# Patient Record
Sex: Female | Born: 1935
Health system: Southern US, Community
[De-identification: ages and names within clinical notes are randomized; demographics above are authoritative.]

## PROBLEM LIST (undated history)

## (undated) DIAGNOSIS — M549 Dorsalgia, unspecified: Secondary | ICD-10-CM

## (undated) DIAGNOSIS — R350 Frequency of micturition: Secondary | ICD-10-CM

## (undated) DIAGNOSIS — I2510 Atherosclerotic heart disease of native coronary artery without angina pectoris: Secondary | ICD-10-CM

## (undated) DIAGNOSIS — G8929 Other chronic pain: Secondary | ICD-10-CM

## (undated) DIAGNOSIS — N289 Disorder of kidney and ureter, unspecified: Secondary | ICD-10-CM

## (undated) DIAGNOSIS — R569 Unspecified convulsions: Secondary | ICD-10-CM

## (undated) DIAGNOSIS — I712 Thoracic aortic aneurysm, without rupture, unspecified: Secondary | ICD-10-CM

## (undated) DIAGNOSIS — E785 Hyperlipidemia, unspecified: Secondary | ICD-10-CM

## (undated) DIAGNOSIS — I82409 Acute embolism and thrombosis of unspecified deep veins of unspecified lower extremity: Secondary | ICD-10-CM

## (undated) DIAGNOSIS — K219 Gastro-esophageal reflux disease without esophagitis: Secondary | ICD-10-CM

## (undated) DIAGNOSIS — K859 Acute pancreatitis without necrosis or infection, unspecified: Secondary | ICD-10-CM

## (undated) DIAGNOSIS — F419 Anxiety disorder, unspecified: Secondary | ICD-10-CM

## (undated) DIAGNOSIS — F32A Depression, unspecified: Secondary | ICD-10-CM

## (undated) DIAGNOSIS — E669 Obesity, unspecified: Secondary | ICD-10-CM

## (undated) DIAGNOSIS — I10 Essential (primary) hypertension: Secondary | ICD-10-CM

## (undated) DIAGNOSIS — D649 Anemia, unspecified: Secondary | ICD-10-CM

## (undated) DIAGNOSIS — M130 Polyarthritis, unspecified: Secondary | ICD-10-CM

## (undated) DIAGNOSIS — R42 Dizziness and giddiness: Secondary | ICD-10-CM

## (undated) DIAGNOSIS — E538 Deficiency of other specified B group vitamins: Secondary | ICD-10-CM

## (undated) DIAGNOSIS — F329 Major depressive disorder, single episode, unspecified: Secondary | ICD-10-CM

## (undated) HISTORY — DX: Obesity, unspecified: E66.9

## (undated) HISTORY — PX: CHOLECYSTECTOMY: SHX55

## (undated) HISTORY — DX: Thoracic aortic aneurysm, without rupture, unspecified: I71.20

## (undated) HISTORY — DX: Polyarthritis, unspecified: M13.0

## (undated) HISTORY — PX: HEMORRHOID SURGERY: SHX153

## (undated) HISTORY — DX: Depression, unspecified: F32.A

## (undated) HISTORY — DX: Gastro-esophageal reflux disease without esophagitis: K21.9

## (undated) HISTORY — DX: Frequency of micturition: R35.0

## (undated) HISTORY — PX: ABDOMINAL HYSTERECTOMY: SHX81

## (undated) HISTORY — DX: Acute embolism and thrombosis of unspecified deep veins of unspecified lower extremity: I82.409

## (undated) HISTORY — DX: Acute pancreatitis without necrosis or infection, unspecified: K85.90

## (undated) HISTORY — DX: Deficiency of other specified B group vitamins: E53.8

## (undated) HISTORY — PX: TUBAL LIGATION: SHX77

## (undated) HISTORY — DX: Anxiety disorder, unspecified: F41.9

## (undated) HISTORY — DX: Anemia, unspecified: D64.9

## (undated) HISTORY — DX: Disorder of kidney and ureter, unspecified: N28.9

## (undated) HISTORY — DX: Essential (primary) hypertension: I10

## (undated) HISTORY — DX: Hyperlipidemia, unspecified: E78.5

## (undated) HISTORY — DX: Thoracic aortic aneurysm, without rupture: I71.2

## (undated) HISTORY — DX: Atherosclerotic heart disease of native coronary artery without angina pectoris: I25.10

## (undated) HISTORY — DX: Major depressive disorder, single episode, unspecified: F32.9

---

## 1997-11-12 HISTORY — PX: CORONARY ANGIOPLASTY WITH STENT PLACEMENT: SHX49

## 1998-07-07 ENCOUNTER — Ambulatory Visit: Admission: RE | Admit: 1998-07-07 | Discharge: 1998-07-07 | Payer: Self-pay | Admitting: Internal Medicine

## 1998-07-09 ENCOUNTER — Inpatient Hospital Stay (HOSPITAL_COMMUNITY): Admission: EM | Admit: 1998-07-09 | Discharge: 1998-07-12 | Payer: Self-pay | Admitting: Emergency Medicine

## 1998-07-09 ENCOUNTER — Encounter: Payer: Self-pay | Admitting: Emergency Medicine

## 1998-08-02 ENCOUNTER — Encounter: Payer: Self-pay | Admitting: Emergency Medicine

## 1998-08-02 ENCOUNTER — Emergency Department (HOSPITAL_COMMUNITY): Admission: EM | Admit: 1998-08-02 | Discharge: 1998-08-02 | Payer: Self-pay | Admitting: Emergency Medicine

## 1998-08-04 ENCOUNTER — Inpatient Hospital Stay (HOSPITAL_COMMUNITY): Admission: RE | Admit: 1998-08-04 | Discharge: 1998-08-05 | Payer: Self-pay | Admitting: Cardiology

## 1998-08-04 ENCOUNTER — Encounter: Payer: Self-pay | Admitting: Cardiology

## 2000-09-05 ENCOUNTER — Emergency Department (HOSPITAL_COMMUNITY): Admission: EM | Admit: 2000-09-05 | Discharge: 2000-09-05 | Payer: Self-pay | Admitting: Emergency Medicine

## 2001-02-12 ENCOUNTER — Encounter: Admission: RE | Admit: 2001-02-12 | Discharge: 2001-05-13 | Payer: Self-pay | Admitting: Psychiatry

## 2001-04-04 ENCOUNTER — Encounter: Payer: Self-pay | Admitting: Psychiatry

## 2001-04-04 ENCOUNTER — Encounter: Admission: RE | Admit: 2001-04-04 | Discharge: 2001-04-04 | Payer: Self-pay | Admitting: Psychiatry

## 2001-06-17 ENCOUNTER — Other Ambulatory Visit: Admission: RE | Admit: 2001-06-17 | Discharge: 2001-06-17 | Payer: Self-pay | Admitting: *Deleted

## 2001-10-27 ENCOUNTER — Encounter: Payer: Self-pay | Admitting: Emergency Medicine

## 2001-10-27 ENCOUNTER — Emergency Department (HOSPITAL_COMMUNITY): Admission: EM | Admit: 2001-10-27 | Discharge: 2001-10-27 | Payer: Self-pay | Admitting: Emergency Medicine

## 2002-02-01 ENCOUNTER — Emergency Department (HOSPITAL_COMMUNITY): Admission: EM | Admit: 2002-02-01 | Discharge: 2002-02-01 | Payer: Self-pay

## 2002-02-01 ENCOUNTER — Encounter: Payer: Self-pay | Admitting: Emergency Medicine

## 2002-08-19 ENCOUNTER — Encounter: Admission: RE | Admit: 2002-08-19 | Discharge: 2002-08-19 | Payer: Self-pay | Admitting: Internal Medicine

## 2002-08-19 ENCOUNTER — Encounter: Payer: Self-pay | Admitting: Internal Medicine

## 2002-09-03 ENCOUNTER — Encounter: Admission: RE | Admit: 2002-09-03 | Discharge: 2002-09-16 | Payer: Self-pay | Admitting: Internal Medicine

## 2002-09-10 ENCOUNTER — Encounter: Admission: RE | Admit: 2002-09-10 | Discharge: 2002-09-10 | Payer: Self-pay | Admitting: Internal Medicine

## 2002-09-10 ENCOUNTER — Encounter: Payer: Self-pay | Admitting: Internal Medicine

## 2002-12-02 ENCOUNTER — Inpatient Hospital Stay (HOSPITAL_COMMUNITY): Admission: EM | Admit: 2002-12-02 | Discharge: 2002-12-06 | Payer: Self-pay | Admitting: Emergency Medicine

## 2002-12-02 ENCOUNTER — Encounter: Payer: Self-pay | Admitting: Internal Medicine

## 2002-12-02 ENCOUNTER — Encounter: Payer: Self-pay | Admitting: Emergency Medicine

## 2002-12-04 ENCOUNTER — Encounter: Payer: Self-pay | Admitting: Internal Medicine

## 2002-12-05 ENCOUNTER — Encounter: Payer: Self-pay | Admitting: Internal Medicine

## 2003-05-03 ENCOUNTER — Encounter: Admission: RE | Admit: 2003-05-03 | Discharge: 2003-05-03 | Payer: Self-pay | Admitting: Rheumatology

## 2003-05-03 ENCOUNTER — Encounter: Payer: Self-pay | Admitting: Rheumatology

## 2003-05-07 ENCOUNTER — Encounter: Payer: Self-pay | Admitting: Emergency Medicine

## 2003-05-07 ENCOUNTER — Encounter: Admission: RE | Admit: 2003-05-07 | Discharge: 2003-05-07 | Payer: Self-pay | Admitting: Rheumatology

## 2003-05-07 ENCOUNTER — Encounter: Payer: Self-pay | Admitting: Rheumatology

## 2003-05-07 ENCOUNTER — Emergency Department (HOSPITAL_COMMUNITY): Admission: EM | Admit: 2003-05-07 | Discharge: 2003-05-07 | Payer: Self-pay | Admitting: Emergency Medicine

## 2003-11-25 ENCOUNTER — Emergency Department (HOSPITAL_COMMUNITY): Admission: EM | Admit: 2003-11-25 | Discharge: 2003-11-25 | Payer: Self-pay | Admitting: Emergency Medicine

## 2004-02-28 ENCOUNTER — Emergency Department (HOSPITAL_COMMUNITY): Admission: EM | Admit: 2004-02-28 | Discharge: 2004-02-29 | Payer: Self-pay | Admitting: Emergency Medicine

## 2004-08-10 ENCOUNTER — Encounter: Admission: RE | Admit: 2004-08-10 | Discharge: 2004-08-10 | Payer: Self-pay | Admitting: Endocrinology

## 2004-09-15 ENCOUNTER — Emergency Department (HOSPITAL_COMMUNITY): Admission: EM | Admit: 2004-09-15 | Discharge: 2004-09-15 | Payer: Self-pay | Admitting: *Deleted

## 2004-09-19 ENCOUNTER — Ambulatory Visit (HOSPITAL_COMMUNITY): Admission: RE | Admit: 2004-09-19 | Discharge: 2004-09-19 | Payer: Self-pay | Admitting: Endocrinology

## 2004-09-19 ENCOUNTER — Ambulatory Visit: Payer: Self-pay | Admitting: Endocrinology

## 2004-09-28 ENCOUNTER — Ambulatory Visit: Payer: Self-pay | Admitting: Endocrinology

## 2004-10-11 ENCOUNTER — Ambulatory Visit: Payer: Self-pay | Admitting: Internal Medicine

## 2004-11-21 ENCOUNTER — Ambulatory Visit: Payer: Self-pay | Admitting: Endocrinology

## 2004-11-22 ENCOUNTER — Encounter: Payer: Self-pay | Admitting: Internal Medicine

## 2004-11-22 ENCOUNTER — Ambulatory Visit: Payer: Self-pay

## 2004-11-22 ENCOUNTER — Encounter: Payer: Self-pay | Admitting: Cardiology

## 2005-01-10 ENCOUNTER — Ambulatory Visit: Payer: Self-pay | Admitting: Endocrinology

## 2005-01-24 ENCOUNTER — Ambulatory Visit: Payer: Self-pay | Admitting: Endocrinology

## 2005-04-15 ENCOUNTER — Emergency Department (HOSPITAL_COMMUNITY): Admission: EM | Admit: 2005-04-15 | Discharge: 2005-04-15 | Payer: Self-pay | Admitting: Emergency Medicine

## 2005-07-24 ENCOUNTER — Ambulatory Visit: Payer: Self-pay | Admitting: Endocrinology

## 2005-08-22 ENCOUNTER — Ambulatory Visit: Payer: Self-pay | Admitting: Endocrinology

## 2005-08-27 ENCOUNTER — Ambulatory Visit: Payer: Self-pay | Admitting: Endocrinology

## 2005-09-03 ENCOUNTER — Encounter: Admission: RE | Admit: 2005-09-03 | Discharge: 2005-09-03 | Payer: Self-pay | Admitting: Endocrinology

## 2005-09-18 ENCOUNTER — Ambulatory Visit: Payer: Self-pay | Admitting: Endocrinology

## 2005-09-24 ENCOUNTER — Ambulatory Visit: Payer: Self-pay | Admitting: Gastroenterology

## 2005-10-09 ENCOUNTER — Ambulatory Visit: Payer: Self-pay | Admitting: Endocrinology

## 2005-10-11 ENCOUNTER — Ambulatory Visit: Payer: Self-pay | Admitting: Gastroenterology

## 2005-10-24 ENCOUNTER — Ambulatory Visit: Payer: Self-pay | Admitting: Gastroenterology

## 2006-02-13 ENCOUNTER — Ambulatory Visit: Payer: Self-pay | Admitting: Endocrinology

## 2006-02-14 ENCOUNTER — Ambulatory Visit: Admission: RE | Admit: 2006-02-14 | Discharge: 2006-02-14 | Payer: Self-pay | Admitting: Endocrinology

## 2006-02-21 ENCOUNTER — Ambulatory Visit: Payer: Self-pay | Admitting: Endocrinology

## 2006-04-09 ENCOUNTER — Ambulatory Visit: Payer: Self-pay | Admitting: Internal Medicine

## 2006-04-15 ENCOUNTER — Ambulatory Visit: Payer: Self-pay | Admitting: Hematology and Oncology

## 2006-04-23 ENCOUNTER — Ambulatory Visit (HOSPITAL_COMMUNITY): Admission: RE | Admit: 2006-04-23 | Discharge: 2006-04-23 | Payer: Self-pay | Admitting: Internal Medicine

## 2006-04-23 LAB — URINALYSIS, MICROSCOPIC - CHCC
Blood: NEGATIVE
Glucose: NEGATIVE g/dL
Nitrite: NEGATIVE

## 2006-04-23 LAB — CBC & DIFF AND RETIC
BASO%: 0.2 % (ref 0.0–2.0)
Basophils Absolute: 0 10*3/uL (ref 0.0–0.1)
EOS%: 2.2 % (ref 0.0–7.0)
Eosinophils Absolute: 0.1 10*3/uL (ref 0.0–0.5)
HCT: 27.9 % — ABNORMAL LOW (ref 34.8–46.6)
HGB: 9.3 g/dL — ABNORMAL LOW (ref 11.6–15.9)
IRF: 0.21 (ref 0.130–0.330)
LYMPH%: 22.1 % (ref 14.0–48.0)
MCH: 30 pg (ref 26.0–34.0)
MCHC: 33.2 g/dL (ref 32.0–36.0)
MCV: 90.3 fL (ref 81.0–101.0)
MONO#: 0.3 10*3/uL (ref 0.1–0.9)
MONO%: 9.2 % (ref 0.0–13.0)
NEUT#: 2.5 10*3/uL (ref 1.5–6.5)
NEUT%: 66.3 % (ref 39.6–76.8)
Platelets: 212 10*3/uL (ref 145–400)
RBC: 3.09 10*6/uL — ABNORMAL LOW (ref 3.70–5.32)
RDW: 13.1 % (ref 11.3–14.5)
RETIC #: 10.8 10*3/uL — ABNORMAL LOW (ref 19.7–115.1)
Retic %: 0.4 % (ref 0.4–2.3)
WBC: 3.8 10*3/uL — ABNORMAL LOW (ref 3.9–10.0)
lymph#: 0.8 10*3/uL — ABNORMAL LOW (ref 0.9–3.3)

## 2006-04-25 ENCOUNTER — Other Ambulatory Visit: Admission: RE | Admit: 2006-04-25 | Discharge: 2006-04-25 | Payer: Self-pay | Admitting: Hematology and Oncology

## 2006-04-25 ENCOUNTER — Encounter (INDEPENDENT_AMBULATORY_CARE_PROVIDER_SITE_OTHER): Payer: Self-pay | Admitting: Specialist

## 2006-04-25 LAB — SPEP & IFE WITH QIG
Albumin ELP: 54 % — ABNORMAL LOW (ref 55.8–66.1)
Alpha-1-Globulin: 4.4 % (ref 2.9–4.9)
Alpha-2-Globulin: 8.8 % (ref 7.1–11.8)
Beta Globulin: 6 % (ref 4.7–7.2)
Total Protein, Serum Electrophoresis: 8.1 g/dL (ref 6.0–8.3)

## 2006-04-25 LAB — COMPREHENSIVE METABOLIC PANEL
AST: 15 U/L (ref 0–37)
BUN: 27 mg/dL — ABNORMAL HIGH (ref 6–23)
CO2: 19 mEq/L (ref 19–32)
Calcium: 9.3 mg/dL (ref 8.4–10.5)
Chloride: 108 mEq/L (ref 96–112)
Creatinine, Ser: 0.99 mg/dL (ref 0.40–1.20)
Glucose, Bld: 77 mg/dL (ref 70–99)

## 2006-04-25 LAB — IRON AND TIBC
Iron: 71 ug/dL (ref 42–145)
UIBC: 200 ug/dL

## 2006-04-25 LAB — VITAMIN B12: Vitamin B-12: >2000 pg/mL — ABNORMAL HIGH (ref 211–911)

## 2006-04-25 LAB — ERYTHROPOIETIN: Erythropoietin: 12.7 m[IU]/mL (ref 2.6–34.0)

## 2006-04-25 LAB — HAPTOGLOBIN: Haptoglobin: 91 mg/dL (ref 16–200)

## 2006-04-25 LAB — DIRECT ANTIGLOBULIN TEST (NOT AT ARMC)
DAT (Complement): NEGATIVE
DAT IgG: NEGATIVE

## 2006-04-25 LAB — LACTATE DEHYDROGENASE: LDH: 158 U/L (ref 94–250)

## 2006-04-25 LAB — FERRITIN: Ferritin: 273 ng/mL (ref 10–291)

## 2006-05-01 LAB — VON WILLEBRAND FACTOR MULTIMER
Von Willebrand Ag: 321 % normal — ABNORMAL HIGH (ref 60–150)
Von Willebrand Multimers: NORMAL

## 2006-05-05 ENCOUNTER — Emergency Department (HOSPITAL_COMMUNITY): Admission: EM | Admit: 2006-05-05 | Discharge: 2006-05-05 | Payer: Self-pay | Admitting: Emergency Medicine

## 2006-05-20 ENCOUNTER — Ambulatory Visit: Payer: Self-pay | Admitting: Internal Medicine

## 2006-05-28 ENCOUNTER — Ambulatory Visit: Payer: Self-pay | Admitting: Internal Medicine

## 2006-05-28 LAB — CBC WITH DIFFERENTIAL/PLATELET
Basophils Absolute: 0 10*3/uL (ref 0.0–0.1)
EOS%: 2 % (ref 0.0–7.0)
Eosinophils Absolute: 0.1 10*3/uL (ref 0.0–0.5)
HGB: 8.9 g/dL — ABNORMAL LOW (ref 11.6–15.9)
NEUT#: 2.9 10*3/uL (ref 1.5–6.5)
RBC: 2.99 10*6/uL — ABNORMAL LOW (ref 3.70–5.32)
RDW: 13 % (ref 11.3–14.5)
lymph#: 0.7 10*3/uL — ABNORMAL LOW (ref 0.9–3.3)

## 2006-05-28 LAB — COMPREHENSIVE METABOLIC PANEL
AST: 13 U/L (ref 0–37)
Albumin: 4.1 g/dL (ref 3.5–5.2)
BUN: 21 mg/dL (ref 6–23)
Calcium: 9 mg/dL (ref 8.4–10.5)
Chloride: 108 mEq/L (ref 96–112)
Potassium: 4.1 mEq/L (ref 3.5–5.3)
Sodium: 141 mEq/L (ref 135–145)
Total Protein: 7.7 g/dL (ref 6.0–8.3)

## 2006-06-02 ENCOUNTER — Inpatient Hospital Stay (HOSPITAL_COMMUNITY): Admission: EM | Admit: 2006-06-02 | Discharge: 2006-06-03 | Payer: Self-pay | Admitting: Emergency Medicine

## 2006-06-03 ENCOUNTER — Ambulatory Visit: Payer: Self-pay | Admitting: Internal Medicine

## 2006-06-14 ENCOUNTER — Ambulatory Visit: Payer: Self-pay | Admitting: Hematology and Oncology

## 2006-06-18 LAB — CBC WITH DIFFERENTIAL/PLATELET
BASO%: 0.7 % (ref 0.0–2.0)
EOS%: 2.5 % (ref 0.0–7.0)
MCH: 30 pg (ref 26.0–34.0)
MCHC: 33.1 g/dL (ref 32.0–36.0)
RBC: 3.58 10*6/uL — ABNORMAL LOW (ref 3.70–5.32)
RDW: 14.4 % (ref 11.3–14.5)
lymph#: 1.1 10*3/uL (ref 0.9–3.3)

## 2006-07-30 ENCOUNTER — Ambulatory Visit: Payer: Self-pay | Admitting: Internal Medicine

## 2006-08-27 ENCOUNTER — Ambulatory Visit: Payer: Self-pay | Admitting: Internal Medicine

## 2006-10-17 ENCOUNTER — Ambulatory Visit: Payer: Self-pay | Admitting: Internal Medicine

## 2006-11-12 HISTORY — PX: CARDIAC CATHETERIZATION: SHX172

## 2006-11-26 ENCOUNTER — Ambulatory Visit: Payer: Self-pay | Admitting: Internal Medicine

## 2006-12-10 ENCOUNTER — Ambulatory Visit: Payer: Self-pay | Admitting: Internal Medicine

## 2007-02-26 ENCOUNTER — Ambulatory Visit: Payer: Self-pay | Admitting: Internal Medicine

## 2007-02-26 LAB — CONVERTED CEMR LAB: Vit D, 1,25-Dihydroxy: <7 — ABNORMAL LOW (ref 20–57)

## 2007-04-28 ENCOUNTER — Ambulatory Visit: Payer: Self-pay | Admitting: Internal Medicine

## 2007-07-02 ENCOUNTER — Ambulatory Visit: Payer: Self-pay | Admitting: Internal Medicine

## 2007-07-02 LAB — CONVERTED CEMR LAB
ALT: 12 units/L (ref 0–35)
AST: 19 units/L (ref 0–37)
Albumin: 3.7 g/dL (ref 3.5–5.2)
Alkaline Phosphatase: 74 units/L (ref 39–117)
BUN: 28 mg/dL — ABNORMAL HIGH (ref 6–23)
Bilirubin, Direct: 0.1 mg/dL (ref 0.0–0.3)
CO2: 27 meq/L (ref 19–32)
Calcium: 10 mg/dL (ref 8.4–10.5)
Chloride: 113 meq/L — ABNORMAL HIGH (ref 96–112)
Cholesterol: 206 mg/dL (ref 0–200)
Creatinine, Ser: 1.1 mg/dL (ref 0.4–1.2)
Direct LDL: 115.2 mg/dL
GFR calc Af Amer: 63 mL/min
GFR calc non Af Amer: 52 mL/min
Glucose, Bld: 98 mg/dL (ref 70–99)
HDL: 70.3 mg/dL (ref >39.0)
Hgb A1c MFr Bld: 6 % (ref 4.6–6.0)
Potassium: 4.8 meq/L (ref 3.5–5.1)
Sodium: 146 meq/L — ABNORMAL HIGH (ref 135–145)
Total Bilirubin: 0.8 mg/dL (ref 0.3–1.2)
Total CHOL/HDL Ratio: 2.9
Total Protein: 8.2 g/dL (ref 6.0–8.3)
Triglycerides: 67 mg/dL (ref 0–149)
VLDL: 13 mg/dL (ref 0–40)

## 2007-07-05 ENCOUNTER — Ambulatory Visit: Payer: Self-pay | Admitting: Internal Medicine

## 2007-07-05 ENCOUNTER — Inpatient Hospital Stay (HOSPITAL_COMMUNITY): Admission: AD | Admit: 2007-07-05 | Discharge: 2007-07-09 | Payer: Self-pay | Admitting: Cardiovascular Disease

## 2007-07-05 ENCOUNTER — Encounter: Payer: Self-pay | Admitting: Emergency Medicine

## 2007-07-05 ENCOUNTER — Ambulatory Visit: Payer: Self-pay | Admitting: Cardiovascular Disease

## 2007-07-05 ENCOUNTER — Ambulatory Visit: Payer: Self-pay | Admitting: Cardiology

## 2007-07-06 ENCOUNTER — Encounter: Payer: Self-pay | Admitting: Cardiovascular Disease

## 2007-07-11 ENCOUNTER — Ambulatory Visit: Payer: Self-pay

## 2007-07-15 ENCOUNTER — Ambulatory Visit: Payer: Self-pay | Admitting: Internal Medicine

## 2007-07-16 ENCOUNTER — Ambulatory Visit: Payer: Self-pay | Admitting: Gastroenterology

## 2007-07-24 ENCOUNTER — Ambulatory Visit: Payer: Self-pay | Admitting: Internal Medicine

## 2007-09-17 ENCOUNTER — Encounter: Payer: Self-pay | Admitting: Internal Medicine

## 2007-09-17 DIAGNOSIS — F4323 Adjustment disorder with mixed anxiety and depressed mood: Secondary | ICD-10-CM

## 2007-09-17 DIAGNOSIS — Z86718 Personal history of other venous thrombosis and embolism: Secondary | ICD-10-CM | POA: Insufficient documentation

## 2007-09-17 DIAGNOSIS — Z8719 Personal history of other diseases of the digestive system: Secondary | ICD-10-CM | POA: Insufficient documentation

## 2007-09-17 DIAGNOSIS — M810 Age-related osteoporosis without current pathological fracture: Secondary | ICD-10-CM | POA: Insufficient documentation

## 2007-09-17 DIAGNOSIS — M109 Gout, unspecified: Secondary | ICD-10-CM

## 2007-09-17 DIAGNOSIS — I2510 Atherosclerotic heart disease of native coronary artery without angina pectoris: Secondary | ICD-10-CM

## 2007-09-17 DIAGNOSIS — N259 Disorder resulting from impaired renal tubular function, unspecified: Secondary | ICD-10-CM | POA: Insufficient documentation

## 2007-11-28 ENCOUNTER — Ambulatory Visit: Payer: Self-pay | Admitting: Internal Medicine

## 2007-11-28 DIAGNOSIS — M2550 Pain in unspecified joint: Secondary | ICD-10-CM | POA: Insufficient documentation

## 2007-11-28 DIAGNOSIS — E1121 Type 2 diabetes mellitus with diabetic nephropathy: Secondary | ICD-10-CM

## 2007-12-01 ENCOUNTER — Ambulatory Visit: Payer: Self-pay | Admitting: Internal Medicine

## 2007-12-01 DIAGNOSIS — I2589 Other forms of chronic ischemic heart disease: Secondary | ICD-10-CM | POA: Insufficient documentation

## 2007-12-05 ENCOUNTER — Encounter: Payer: Self-pay | Admitting: Internal Medicine

## 2007-12-07 ENCOUNTER — Encounter: Payer: Self-pay | Admitting: Internal Medicine

## 2007-12-07 ENCOUNTER — Ambulatory Visit: Payer: Self-pay | Admitting: Cardiology

## 2007-12-07 ENCOUNTER — Ambulatory Visit: Payer: Self-pay | Admitting: Internal Medicine

## 2007-12-07 ENCOUNTER — Inpatient Hospital Stay (HOSPITAL_COMMUNITY): Admission: EM | Admit: 2007-12-07 | Discharge: 2007-12-12 | Payer: Self-pay | Admitting: Emergency Medicine

## 2007-12-07 DIAGNOSIS — F411 Generalized anxiety disorder: Secondary | ICD-10-CM

## 2007-12-07 DIAGNOSIS — R079 Chest pain, unspecified: Secondary | ICD-10-CM | POA: Insufficient documentation

## 2007-12-07 DIAGNOSIS — K219 Gastro-esophageal reflux disease without esophagitis: Secondary | ICD-10-CM

## 2007-12-11 LAB — CONVERTED CEMR LAB: Vit D, 1,25-Dihydroxy: 20 — ABNORMAL LOW (ref 30–89)

## 2007-12-12 ENCOUNTER — Telehealth: Payer: Self-pay | Admitting: Internal Medicine

## 2007-12-17 ENCOUNTER — Telehealth: Payer: Self-pay | Admitting: Internal Medicine

## 2007-12-22 LAB — CONVERTED CEMR LAB
ALT: 11 units/L (ref 0–35)
AST: 18 units/L (ref 0–37)
Albumin: 3.9 g/dL (ref 3.5–5.2)
Alkaline Phosphatase: 70 units/L (ref 39–117)
BUN: 33 mg/dL — ABNORMAL HIGH (ref 6–23)
Basophils Relative: 0.6 % (ref 0.0–1.0)
Bilirubin, Direct: 0.1 mg/dL (ref 0.0–0.3)
CO2: 22 meq/L (ref 19–32)
Calcium: 9.3 mg/dL (ref 8.4–10.5)
Chloride: 105 meq/L (ref 96–112)
Creatinine, Ser: 1.3 mg/dL — ABNORMAL HIGH (ref 0.4–1.2)
Eosinophils Relative: 2.1 % (ref 0.0–5.0)
GFR calc Af Amer: 52 mL/min
GFR calc non Af Amer: 43 mL/min
Glucose, Bld: 93 mg/dL (ref 70–99)
HCT: 29.1 % — ABNORMAL LOW (ref 36.0–46.0)
Hemoglobin: 9.9 g/dL — ABNORMAL LOW (ref 12.0–15.0)
Hgb A1c MFr Bld: 6.4 % — ABNORMAL HIGH (ref 4.6–6.0)
Lymphocytes Relative: 18.5 % (ref 12.0–46.0)
MCHC: 34.2 g/dL (ref 30.0–36.0)
MCV: 89.4 fL (ref 78.0–100.0)
Monocytes Relative: 9 % (ref 3.0–11.0)
Neutrophils Relative %: 69.8 % (ref 43.0–77.0)
Platelets: 149 10*3/uL — ABNORMAL LOW (ref 150–400)
Potassium: 5.1 meq/L (ref 3.5–5.1)
RBC: 3.25 M/uL — ABNORMAL LOW (ref 3.87–5.11)
RDW: 12.3 % (ref 11.5–14.6)
Sed Rate: 111 mm/hr — ABNORMAL HIGH (ref 0–25)
Sodium: 136 meq/L (ref 135–145)
TSH: 1.42 microintl units/mL (ref 0.35–5.50)
Total Bilirubin: 0.7 mg/dL (ref 0.3–1.2)
Total CK: 59 units/L (ref 7–177)
Total Protein: 8 g/dL (ref 6.0–8.3)
Vitamin B-12: 611 pg/mL (ref 211–911)
WBC: 5.7 10*3/uL (ref 4.5–10.5)

## 2007-12-24 ENCOUNTER — Ambulatory Visit: Payer: Self-pay | Admitting: Internal Medicine

## 2007-12-24 DIAGNOSIS — R0602 Shortness of breath: Secondary | ICD-10-CM | POA: Insufficient documentation

## 2007-12-24 DIAGNOSIS — E785 Hyperlipidemia, unspecified: Secondary | ICD-10-CM | POA: Insufficient documentation

## 2008-01-07 ENCOUNTER — Ambulatory Visit: Payer: Self-pay | Admitting: Internal Medicine

## 2008-02-24 ENCOUNTER — Encounter: Payer: Self-pay | Admitting: Internal Medicine

## 2008-03-17 ENCOUNTER — Ambulatory Visit: Payer: Self-pay | Admitting: Internal Medicine

## 2008-03-17 LAB — CONVERTED CEMR LAB
BUN: 25 mg/dL — ABNORMAL HIGH (ref 6–23)
Basophils Absolute: 0 10*3/uL (ref 0.0–0.1)
Basophils Relative: 0.7 % (ref 0.0–1.0)
CO2: 23 meq/L (ref 19–32)
Calcium: 9.3 mg/dL (ref 8.4–10.5)
Chloride: 115 meq/L — ABNORMAL HIGH (ref 96–112)
Creatinine, Ser: 1.1 mg/dL (ref 0.4–1.2)
Eosinophils Absolute: 0.2 10*3/uL (ref 0.0–0.7)
Eosinophils Relative: 4.5 % (ref 0.0–5.0)
GFR calc Af Amer: 63 mL/min
GFR calc non Af Amer: 52 mL/min
Glucose, Bld: 101 mg/dL — ABNORMAL HIGH (ref 70–99)
HCT: 28.8 % — ABNORMAL LOW (ref 36.0–46.0)
Hemoglobin: 9.3 g/dL — ABNORMAL LOW (ref 12.0–15.0)
Iron: 79 ug/dL (ref 42–145)
Lymphocytes Relative: 30.9 % (ref 12.0–46.0)
MCHC: 32.4 g/dL (ref 30.0–36.0)
MCV: 91.1 fL (ref 78.0–100.0)
Monocytes Absolute: 0.4 10*3/uL (ref 0.1–1.0)
Monocytes Relative: 9.2 % (ref 3.0–12.0)
Neutro Abs: 2.5 10*3/uL (ref 1.4–7.7)
Neutrophils Relative %: 54.7 % (ref 43.0–77.0)
Platelets: 184 10*3/uL (ref 150–400)
Potassium: 4.5 meq/L (ref 3.5–5.1)
RBC: 3.16 M/uL — ABNORMAL LOW (ref 3.87–5.11)
RDW: 12.7 % (ref 11.5–14.6)
Saturation Ratios: 31.4 % (ref 20.0–50.0)
Sodium: 145 meq/L (ref 135–145)
Transferrin: 179.8 mg/dL — ABNORMAL LOW (ref >=212.0)
WBC: 4.5 10*3/uL (ref 4.5–10.5)

## 2008-03-18 ENCOUNTER — Ambulatory Visit: Payer: Self-pay | Admitting: Internal Medicine

## 2008-03-18 DIAGNOSIS — R300 Dysuria: Secondary | ICD-10-CM | POA: Insufficient documentation

## 2008-03-18 DIAGNOSIS — R109 Unspecified abdominal pain: Secondary | ICD-10-CM

## 2008-03-19 LAB — CONVERTED CEMR LAB
Bacteria, UA: NEGATIVE
Bilirubin Urine: NEGATIVE
Crystals: NEGATIVE
Hemoglobin, Urine: NEGATIVE
Ketones, ur: NEGATIVE mg/dL
Leukocytes, UA: NEGATIVE
Mucus, UA: NEGATIVE
Nitrite: NEGATIVE
RBC / HPF: NONE SEEN
Specific Gravity, Urine: 1.025 (ref 1.000–1.03)
Total Protein, Urine: 30 mg/dL — AB
Urine Glucose: NEGATIVE mg/dL
Urobilinogen, UA: 0.2 (ref 0.0–1.0)
pH: 5.5 (ref 5.0–8.0)

## 2008-06-14 ENCOUNTER — Ambulatory Visit: Payer: Self-pay | Admitting: Internal Medicine

## 2008-06-14 LAB — CONVERTED CEMR LAB
ALT: 11 units/L (ref 0–35)
AST: 20 units/L (ref 0–37)
Albumin: 3.9 g/dL (ref 3.5–5.2)
Alkaline Phosphatase: 56 units/L (ref 39–117)
BUN: 36 mg/dL — ABNORMAL HIGH (ref 6–23)
Basophils Absolute: 0 10*3/uL (ref 0.0–0.1)
Basophils Relative: 0.8 % (ref 0.0–3.0)
Bilirubin Urine: NEGATIVE
Bilirubin, Direct: 0.1 mg/dL (ref 0.0–0.3)
CO2: 21 meq/L (ref 19–32)
Calcium: 9.4 mg/dL (ref 8.4–10.5)
Chloride: 114 meq/L — ABNORMAL HIGH (ref 96–112)
Creatinine, Ser: 1.2 mg/dL (ref 0.4–1.2)
Eosinophils Absolute: 0.2 10*3/uL (ref 0.0–0.7)
Eosinophils Relative: 2.9 % (ref 0.0–5.0)
GFR calc Af Amer: 57 mL/min
GFR calc non Af Amer: 47 mL/min
Glucose, Bld: 81 mg/dL (ref 70–99)
HCT: 27.9 % — ABNORMAL LOW (ref 36.0–46.0)
Hemoglobin, Urine: NEGATIVE
Hemoglobin: 9.6 g/dL — ABNORMAL LOW (ref 12.0–15.0)
Hgb A1c MFr Bld: 6.3 % — ABNORMAL HIGH (ref 4.6–6.0)
Ketones, ur: NEGATIVE mg/dL
Leukocytes, UA: NEGATIVE
Lymphocytes Relative: 27.8 % (ref 12.0–46.0)
MCHC: 34.5 g/dL (ref 30.0–36.0)
MCV: 90 fL (ref 78.0–100.0)
Monocytes Absolute: 0.6 10*3/uL (ref 0.1–1.0)
Monocytes Relative: 10.4 % (ref 3.0–12.0)
Neutro Abs: 3.1 10*3/uL (ref 1.4–7.7)
Neutrophils Relative %: 58.1 % (ref 43.0–77.0)
Nitrite: NEGATIVE
Platelets: 184 10*3/uL (ref 150–400)
Potassium: 5.1 meq/L (ref 3.5–5.1)
RBC: 3.1 M/uL — ABNORMAL LOW (ref 3.87–5.11)
RDW: 11.8 % (ref 11.5–14.6)
Sodium: 142 meq/L (ref 135–145)
Specific Gravity, Urine: 1.015 (ref 1.000–1.03)
Total Bilirubin: 0.6 mg/dL (ref 0.3–1.2)
Total Protein, Urine: NEGATIVE mg/dL
Total Protein: 8.1 g/dL (ref 6.0–8.3)
Urine Glucose: NEGATIVE mg/dL
Urobilinogen, UA: 0.2 (ref 0.0–1.0)
WBC: 5.4 10*3/uL (ref 4.5–10.5)
pH: 5.5 (ref 5.0–8.0)

## 2008-06-23 ENCOUNTER — Ambulatory Visit: Payer: Self-pay | Admitting: Internal Medicine

## 2008-06-23 DIAGNOSIS — R071 Chest pain on breathing: Secondary | ICD-10-CM | POA: Insufficient documentation

## 2008-06-23 DIAGNOSIS — M545 Low back pain, unspecified: Secondary | ICD-10-CM | POA: Insufficient documentation

## 2008-07-21 ENCOUNTER — Ambulatory Visit: Payer: Self-pay | Admitting: Internal Medicine

## 2008-07-26 ENCOUNTER — Encounter: Payer: Self-pay | Admitting: Internal Medicine

## 2008-08-05 ENCOUNTER — Ambulatory Visit: Payer: Self-pay | Admitting: Internal Medicine

## 2008-08-10 ENCOUNTER — Ambulatory Visit: Payer: Self-pay | Admitting: Internal Medicine

## 2008-08-10 DIAGNOSIS — E538 Deficiency of other specified B group vitamins: Secondary | ICD-10-CM | POA: Insufficient documentation

## 2008-08-16 LAB — CONVERTED CEMR LAB
BUN: 28 mg/dL — ABNORMAL HIGH (ref 6–23)
Basophils Relative: 0.5 % (ref 0.0–3.0)
Bilirubin Urine: NEGATIVE
CO2: 18 meq/L — ABNORMAL LOW (ref 19–32)
Calcium: 9.6 mg/dL (ref 8.4–10.5)
Chloride: 106 meq/L (ref 96–112)
Creatinine, Ser: 1.1 mg/dL (ref 0.4–1.2)
Crystals: NEGATIVE
Eosinophils Relative: 3.8 % (ref 0.0–5.0)
GFR calc Af Amer: 63 mL/min
GFR calc non Af Amer: 52 mL/min
Glucose, Bld: 82 mg/dL (ref 70–99)
HCT: 30.2 % — ABNORMAL LOW (ref 36.0–46.0)
Hemoglobin, Urine: NEGATIVE
Hemoglobin: 10.2 g/dL — ABNORMAL LOW (ref 12.0–15.0)
Iron: 61 ug/dL (ref 42–145)
Ketones, ur: NEGATIVE mg/dL
Lymphocytes Relative: 29.5 % (ref 12.0–46.0)
MCHC: 33.8 g/dL (ref 30.0–36.0)
MCV: 90.1 fL (ref 78.0–100.0)
Monocytes Relative: 10.3 % (ref 3.0–12.0)
Mucus, UA: NEGATIVE
Neutrophils Relative %: 55.9 % (ref 43.0–77.0)
Nitrite: NEGATIVE
Platelets: 167 10*3/uL (ref 150–400)
Potassium: 4.6 meq/L (ref 3.5–5.1)
RBC / HPF: NONE SEEN
RBC: 3.35 M/uL — ABNORMAL LOW (ref 3.87–5.11)
RDW: 12 % (ref 11.5–14.6)
Saturation Ratios: 21.9 % (ref 20.0–50.0)
Sodium: 140 meq/L (ref 135–145)
Specific Gravity, Urine: 1.02 (ref 1.000–1.03)
TSH: 1.66 microintl units/mL (ref 0.35–5.50)
Transferrin: 198.9 mg/dL — ABNORMAL LOW (ref >=212.0)
Urine Glucose: NEGATIVE mg/dL
Urobilinogen, UA: 0.2 (ref 0.0–1.0)
Vitamin B-12: >1500 pg/mL — ABNORMAL HIGH (ref 211–911)
WBC: 4.6 10*3/uL (ref 4.5–10.5)
pH: 5.5 (ref 5.0–8.0)

## 2008-10-01 ENCOUNTER — Telehealth: Payer: Self-pay | Admitting: Internal Medicine

## 2008-10-18 ENCOUNTER — Ambulatory Visit: Payer: Self-pay | Admitting: Internal Medicine

## 2008-10-18 DIAGNOSIS — H9319 Tinnitus, unspecified ear: Secondary | ICD-10-CM | POA: Insufficient documentation

## 2008-12-31 ENCOUNTER — Encounter: Payer: Self-pay | Admitting: Internal Medicine

## 2008-12-31 ENCOUNTER — Ambulatory Visit: Payer: Self-pay | Admitting: Internal Medicine

## 2009-01-04 ENCOUNTER — Ambulatory Visit: Payer: Self-pay | Admitting: Internal Medicine

## 2009-01-04 LAB — CONVERTED CEMR LAB
BUN: 28 mg/dL — ABNORMAL HIGH (ref 6–23)
CO2: 23 meq/L (ref 19–32)
Calcium: 9.5 mg/dL (ref 8.4–10.5)
Chloride: 110 meq/L (ref 96–112)
Cholesterol: 223 mg/dL (ref 0–200)
Creatinine, Ser: 1 mg/dL (ref 0.4–1.2)
Direct LDL: 121 mg/dL
GFR calc Af Amer: 70 mL/min
GFR calc non Af Amer: 58 mL/min
Glucose, Bld: 89 mg/dL (ref 70–99)
HDL: 74.3 mg/dL (ref >39.0)
Hgb A1c MFr Bld: 6.1 % — ABNORMAL HIGH (ref 4.6–6.0)
Potassium: 4.9 meq/L (ref 3.5–5.1)
Sodium: 141 meq/L (ref 135–145)
TSH: 1.41 microintl units/mL (ref 0.35–5.50)
Total CHOL/HDL Ratio: 3
Triglycerides: 80 mg/dL (ref 0–149)
VLDL: 16 mg/dL (ref 0–40)
Vitamin B-12: 717 pg/mL (ref 211–911)

## 2009-01-17 ENCOUNTER — Ambulatory Visit: Payer: Self-pay | Admitting: Internal Medicine

## 2009-01-17 DIAGNOSIS — M79609 Pain in unspecified limb: Secondary | ICD-10-CM | POA: Insufficient documentation

## 2009-01-17 DIAGNOSIS — R21 Rash and other nonspecific skin eruption: Secondary | ICD-10-CM | POA: Insufficient documentation

## 2009-01-18 ENCOUNTER — Telehealth: Payer: Self-pay | Admitting: Internal Medicine

## 2009-01-31 ENCOUNTER — Telehealth: Payer: Self-pay | Admitting: Internal Medicine

## 2009-02-22 ENCOUNTER — Encounter: Payer: Self-pay | Admitting: Internal Medicine

## 2009-03-03 ENCOUNTER — Telehealth: Payer: Self-pay | Admitting: Internal Medicine

## 2009-03-10 ENCOUNTER — Ambulatory Visit: Payer: Self-pay | Admitting: Internal Medicine

## 2009-03-21 ENCOUNTER — Telehealth: Payer: Self-pay | Admitting: Internal Medicine

## 2009-03-24 ENCOUNTER — Ambulatory Visit: Payer: Self-pay | Admitting: Internal Medicine

## 2009-03-28 ENCOUNTER — Telehealth: Payer: Self-pay | Admitting: Internal Medicine

## 2009-04-25 ENCOUNTER — Telehealth: Payer: Self-pay | Admitting: Internal Medicine

## 2009-04-25 ENCOUNTER — Ambulatory Visit: Payer: Self-pay | Admitting: Internal Medicine

## 2009-05-03 ENCOUNTER — Ambulatory Visit: Payer: Self-pay | Admitting: Internal Medicine

## 2009-05-10 ENCOUNTER — Ambulatory Visit: Payer: Self-pay | Admitting: Internal Medicine

## 2009-05-10 LAB — CONVERTED CEMR LAB
BUN: 22 mg/dL (ref 6–23)
CO2: 24 meq/L (ref 19–32)
Calcium: 9.1 mg/dL (ref 8.4–10.5)
Chloride: 111 meq/L (ref 96–112)
Cholesterol: 201 mg/dL — ABNORMAL HIGH (ref 0–200)
Creatinine, Ser: 1 mg/dL (ref 0.4–1.2)
Direct LDL: 111.6 mg/dL
GFR calc non Af Amer: 69.69 mL/min (ref >60)
Glucose, Bld: 78 mg/dL (ref 70–99)
HDL: 63.6 mg/dL (ref >39.00)
Hgb A1c MFr Bld: 6.1 % (ref 4.6–6.5)
Potassium: 5.2 meq/L — ABNORMAL HIGH (ref 3.5–5.1)
Sodium: 140 meq/L (ref 135–145)
Total CHOL/HDL Ratio: 3
Triglycerides: 93 mg/dL (ref 0.0–149.0)
VLDL: 18.6 mg/dL (ref 0.0–40.0)

## 2009-05-11 LAB — CONVERTED CEMR LAB: Vit D, 25-Hydroxy: 25 ng/mL — ABNORMAL LOW (ref 30–89)

## 2009-05-19 ENCOUNTER — Telehealth (INDEPENDENT_AMBULATORY_CARE_PROVIDER_SITE_OTHER): Payer: Self-pay | Admitting: *Deleted

## 2009-05-25 ENCOUNTER — Ambulatory Visit: Payer: Self-pay | Admitting: Internal Medicine

## 2009-05-30 ENCOUNTER — Ambulatory Visit: Payer: Self-pay | Admitting: Internal Medicine

## 2009-06-13 ENCOUNTER — Encounter: Admission: RE | Admit: 2009-06-13 | Discharge: 2009-07-14 | Payer: Self-pay | Admitting: Internal Medicine

## 2009-06-28 ENCOUNTER — Ambulatory Visit: Payer: Self-pay | Admitting: Internal Medicine

## 2009-08-02 ENCOUNTER — Ambulatory Visit: Payer: Self-pay | Admitting: Internal Medicine

## 2009-08-19 ENCOUNTER — Telehealth: Payer: Self-pay | Admitting: Internal Medicine

## 2009-08-30 ENCOUNTER — Ambulatory Visit: Payer: Self-pay | Admitting: Internal Medicine

## 2009-08-30 LAB — CONVERTED CEMR LAB: Vit D, 25-Hydroxy: 31 ng/mL (ref 30–89)

## 2009-08-31 ENCOUNTER — Ambulatory Visit: Payer: Self-pay | Admitting: Internal Medicine

## 2009-08-31 ENCOUNTER — Encounter: Payer: Self-pay | Admitting: Internal Medicine

## 2009-09-01 LAB — CONVERTED CEMR LAB
BUN: 22 mg/dL (ref 6–23)
Basophils Absolute: 0 10*3/uL (ref 0.0–0.1)
Basophils Relative: 0.3 % (ref 0.0–3.0)
CO2: 25 meq/L (ref 19–32)
Calcium: 9.6 mg/dL (ref 8.4–10.5)
Chloride: 108 meq/L (ref 96–112)
Creatinine, Ser: 1 mg/dL (ref 0.4–1.2)
Eosinophils Absolute: 0.1 10*3/uL (ref 0.0–0.7)
Eosinophils Relative: 2 % (ref 0.0–5.0)
GFR calc non Af Amer: 69.63 mL/min (ref >60)
Glucose, Bld: 86 mg/dL (ref 70–99)
HCT: 30.4 % — ABNORMAL LOW (ref 36.0–46.0)
Hemoglobin: 9.9 g/dL — ABNORMAL LOW (ref 12.0–15.0)
Lymphocytes Relative: 22.8 % (ref 12.0–46.0)
Lymphs Abs: 1.2 10*3/uL (ref 0.7–4.0)
MCHC: 32.5 g/dL (ref 30.0–36.0)
MCV: 92 fL (ref 78.0–100.0)
Monocytes Absolute: 0.2 10*3/uL (ref 0.1–1.0)
Monocytes Relative: 4.6 % (ref 3.0–12.0)
Neutro Abs: 3.9 10*3/uL (ref 1.4–7.7)
Neutrophils Relative %: 70.3 % (ref 43.0–77.0)
Platelets: 167 10*3/uL (ref 150.0–400.0)
Potassium: 4.5 meq/L (ref 3.5–5.1)
RBC: 3.31 M/uL — ABNORMAL LOW (ref 3.87–5.11)
RDW: 12.2 % (ref 11.5–14.6)
Sed Rate: 107 mm/hr — ABNORMAL HIGH (ref 0–22)
Sodium: 142 meq/L (ref 135–145)
TSH: 1.2 microintl units/mL (ref 0.35–5.50)
Total CK: 54 units/L (ref 7–177)
Vitamin B-12: 1078 pg/mL — ABNORMAL HIGH (ref 211–911)
WBC: 5.4 10*3/uL (ref 4.5–10.5)

## 2009-09-15 ENCOUNTER — Telehealth: Payer: Self-pay | Admitting: Internal Medicine

## 2009-09-27 ENCOUNTER — Telehealth (INDEPENDENT_AMBULATORY_CARE_PROVIDER_SITE_OTHER): Payer: Self-pay | Admitting: *Deleted

## 2009-10-03 ENCOUNTER — Ambulatory Visit: Payer: Self-pay | Admitting: Internal Medicine

## 2009-11-02 ENCOUNTER — Ambulatory Visit: Payer: Self-pay | Admitting: Internal Medicine

## 2009-11-08 ENCOUNTER — Telehealth: Payer: Self-pay | Admitting: Internal Medicine

## 2009-11-10 ENCOUNTER — Ambulatory Visit: Payer: Self-pay | Admitting: Internal Medicine

## 2009-11-18 ENCOUNTER — Telehealth: Payer: Self-pay | Admitting: Internal Medicine

## 2009-12-09 ENCOUNTER — Telehealth: Payer: Self-pay | Admitting: Internal Medicine

## 2009-12-12 ENCOUNTER — Ambulatory Visit: Payer: Self-pay | Admitting: Internal Medicine

## 2009-12-13 ENCOUNTER — Ambulatory Visit: Payer: Self-pay | Admitting: Internal Medicine

## 2009-12-26 ENCOUNTER — Telehealth: Payer: Self-pay | Admitting: Internal Medicine

## 2010-01-05 ENCOUNTER — Ambulatory Visit: Payer: Self-pay | Admitting: Internal Medicine

## 2010-01-09 LAB — CONVERTED CEMR LAB
ALT: 11 units/L (ref 0–35)
AST: 20 units/L (ref 0–37)
Albumin: 4.3 g/dL (ref 3.5–5.2)
Alkaline Phosphatase: 70 units/L (ref 39–117)
BUN: 32 mg/dL — ABNORMAL HIGH (ref 6–23)
Basophils Absolute: 0 10*3/uL (ref 0.0–0.1)
Basophils Relative: 0.3 % (ref 0.0–3.0)
Bilirubin, Direct: 0.1 mg/dL (ref 0.0–0.3)
CO2: 21 meq/L (ref 19–32)
Calcium: 9.6 mg/dL (ref 8.4–10.5)
Chloride: 109 meq/L (ref 96–112)
Cholesterol: 223 mg/dL — ABNORMAL HIGH (ref 0–200)
Creatinine, Ser: 1.2 mg/dL (ref 0.4–1.2)
Direct LDL: 124.2 mg/dL
Eosinophils Absolute: 0.1 10*3/uL (ref 0.0–0.7)
Eosinophils Relative: 2.6 % (ref 0.0–5.0)
GFR calc non Af Amer: 56.37 mL/min (ref >60)
Glucose, Bld: 79 mg/dL (ref 70–99)
HCT: 31 % — ABNORMAL LOW (ref 36.0–46.0)
HDL: 78.6 mg/dL (ref >39.00)
Hemoglobin: 9.8 g/dL — ABNORMAL LOW (ref 12.0–15.0)
Hgb A1c MFr Bld: 6.1 % (ref 4.6–6.5)
Lymphocytes Relative: 23.9 % (ref 12.0–46.0)
Lymphs Abs: 1.2 10*3/uL (ref 0.7–4.0)
MCHC: 31.8 g/dL (ref 30.0–36.0)
MCV: 92.8 fL (ref 78.0–100.0)
Monocytes Absolute: 0.5 10*3/uL (ref 0.1–1.0)
Monocytes Relative: 9.4 % (ref 3.0–12.0)
Neutro Abs: 3.1 10*3/uL (ref 1.4–7.7)
Neutrophils Relative %: 63.8 % (ref 43.0–77.0)
Platelets: 181 10*3/uL (ref 150.0–400.0)
Potassium: 5.9 meq/L — ABNORMAL HIGH (ref 3.5–5.1)
RBC: 3.34 M/uL — ABNORMAL LOW (ref 3.87–5.11)
RDW: 12.2 % (ref 11.5–14.6)
Sodium: 139 meq/L (ref 135–145)
TSH: 1.73 microintl units/mL (ref 0.35–5.50)
Total Bilirubin: 0.4 mg/dL (ref 0.3–1.2)
Total CHOL/HDL Ratio: 3
Total Protein: 8.3 g/dL (ref 6.0–8.3)
Triglycerides: 118 mg/dL (ref 0.0–149.0)
VLDL: 23.6 mg/dL (ref 0.0–40.0)
Vitamin B-12: >1500 pg/mL — ABNORMAL HIGH (ref 211–911)
WBC: 4.9 10*3/uL (ref 4.5–10.5)

## 2010-01-17 ENCOUNTER — Ambulatory Visit: Payer: Self-pay | Admitting: Internal Medicine

## 2010-01-17 DIAGNOSIS — K5909 Other constipation: Secondary | ICD-10-CM | POA: Insufficient documentation

## 2010-02-02 ENCOUNTER — Ambulatory Visit: Payer: Self-pay | Admitting: Internal Medicine

## 2010-03-02 ENCOUNTER — Telehealth: Payer: Self-pay | Admitting: Internal Medicine

## 2010-03-16 ENCOUNTER — Telehealth (INDEPENDENT_AMBULATORY_CARE_PROVIDER_SITE_OTHER): Payer: Self-pay | Admitting: *Deleted

## 2010-03-16 ENCOUNTER — Ambulatory Visit: Payer: Self-pay | Admitting: Internal Medicine

## 2010-03-16 DIAGNOSIS — R350 Frequency of micturition: Secondary | ICD-10-CM | POA: Insufficient documentation

## 2010-03-20 LAB — CONVERTED CEMR LAB
Bilirubin Urine: NEGATIVE
Nitrite: POSITIVE
Specific Gravity, Urine: 1.025 (ref 1.000–1.030)
Total Protein, Urine: 100 mg/dL
Urine Glucose: NEGATIVE mg/dL
Urobilinogen, UA: 0.2 (ref 0.0–1.0)
pH: 6 (ref 5.0–8.0)

## 2010-03-22 ENCOUNTER — Telehealth: Payer: Self-pay | Admitting: Internal Medicine

## 2010-04-22 ENCOUNTER — Encounter: Payer: Self-pay | Admitting: Internal Medicine

## 2010-04-25 ENCOUNTER — Ambulatory Visit: Payer: Self-pay | Admitting: Internal Medicine

## 2010-04-25 DIAGNOSIS — M25519 Pain in unspecified shoulder: Secondary | ICD-10-CM | POA: Insufficient documentation

## 2010-05-02 ENCOUNTER — Telehealth: Payer: Self-pay | Admitting: Internal Medicine

## 2010-05-10 ENCOUNTER — Emergency Department (HOSPITAL_COMMUNITY): Admission: EM | Admit: 2010-05-10 | Discharge: 2010-05-10 | Payer: Self-pay | Admitting: Emergency Medicine

## 2010-05-11 ENCOUNTER — Ambulatory Visit: Payer: Self-pay | Admitting: Internal Medicine

## 2010-05-11 DIAGNOSIS — R42 Dizziness and giddiness: Secondary | ICD-10-CM

## 2010-05-26 ENCOUNTER — Ambulatory Visit: Payer: Self-pay | Admitting: Internal Medicine

## 2010-05-26 DIAGNOSIS — M25569 Pain in unspecified knee: Secondary | ICD-10-CM

## 2010-06-02 ENCOUNTER — Ambulatory Visit: Payer: Self-pay | Admitting: Internal Medicine

## 2010-06-02 LAB — CONVERTED CEMR LAB
ALT: 8 units/L (ref 0–35)
Albumin: 4 g/dL (ref 3.5–5.2)
Alkaline Phosphatase: 62 units/L (ref 39–117)
BUN: 29 mg/dL — ABNORMAL HIGH (ref 6–23)
Basophils Absolute: 0 10*3/uL (ref 0.0–0.1)
Basophils Relative: 0.5 % (ref 0.0–3.0)
CO2: 23 meq/L (ref 19–32)
Calcium: 9.7 mg/dL (ref 8.4–10.5)
Chloride: 109 meq/L (ref 96–112)
Creatinine, Ser: 1.3 mg/dL — ABNORMAL HIGH (ref 0.4–1.2)
Eosinophils Absolute: 0.1 10*3/uL (ref 0.0–0.7)
Eosinophils Relative: 1.6 % (ref 0.0–5.0)
GFR calc non Af Amer: 50.44 mL/min (ref >60)
Glucose, Bld: 93 mg/dL (ref 70–99)
HCT: 29 % — ABNORMAL LOW (ref 36.0–46.0)
Hemoglobin: 9.9 g/dL — ABNORMAL LOW (ref 12.0–15.0)
Lymphocytes Relative: 21.7 % (ref 12.0–46.0)
Lymphs Abs: 1.1 10*3/uL (ref 0.7–4.0)
MCHC: 34 g/dL (ref 30.0–36.0)
MCV: 88.9 fL (ref 78.0–100.0)
Monocytes Relative: 8.5 % (ref 3.0–12.0)
Neutro Abs: 3.3 10*3/uL (ref 1.4–7.7)
Platelets: 181 10*3/uL (ref 150.0–400.0)
RBC: 3.26 M/uL — ABNORMAL LOW (ref 3.87–5.11)
RDW: 13.4 % (ref 11.5–14.6)
Sodium: 142 meq/L (ref 135–145)
TSH: 1.38 microintl units/mL (ref 0.35–5.50)
Total Bilirubin: 0.6 mg/dL (ref 0.3–1.2)
Total Protein: 7.8 g/dL (ref 6.0–8.3)
Vitamin B-12: 1144 pg/mL — ABNORMAL HIGH (ref 211–911)
WBC: 4.9 10*3/uL (ref 4.5–10.5)

## 2010-08-01 ENCOUNTER — Telehealth: Payer: Self-pay | Admitting: Internal Medicine

## 2010-08-15 ENCOUNTER — Telehealth: Payer: Self-pay | Admitting: Internal Medicine

## 2010-09-06 ENCOUNTER — Ambulatory Visit: Payer: Self-pay | Admitting: Internal Medicine

## 2010-09-06 DIAGNOSIS — D638 Anemia in other chronic diseases classified elsewhere: Secondary | ICD-10-CM

## 2010-09-06 LAB — HM DIABETES FOOT EXAM

## 2010-09-07 LAB — CONVERTED CEMR LAB
Basophils Relative: 0.5 % (ref 0.0–3.0)
CO2: 23 meq/L (ref 19–32)
Calcium: 9.2 mg/dL (ref 8.4–10.5)
Creatinine, Ser: 1.1 mg/dL (ref 0.4–1.2)
Eosinophils Absolute: 0.1 10*3/uL (ref 0.0–0.7)
GFR calc non Af Amer: 60.93 mL/min (ref >60)
Hgb A1c MFr Bld: 6.2 % (ref 4.6–6.5)
Lymphocytes Relative: 13.8 % (ref 12.0–46.0)
MCHC: 33.7 g/dL (ref 30.0–36.0)
Monocytes Relative: 5.5 % (ref 3.0–12.0)
Neutrophils Relative %: 78 % — ABNORMAL HIGH (ref 43.0–77.0)
RBC: 3.14 M/uL — ABNORMAL LOW (ref 3.87–5.11)
WBC: 6.6 10*3/uL (ref 4.5–10.5)

## 2010-09-25 ENCOUNTER — Telehealth (INDEPENDENT_AMBULATORY_CARE_PROVIDER_SITE_OTHER): Payer: Self-pay | Admitting: *Deleted

## 2010-11-07 ENCOUNTER — Telehealth: Payer: Self-pay | Admitting: Internal Medicine

## 2010-12-11 ENCOUNTER — Encounter: Payer: Self-pay | Admitting: Internal Medicine

## 2010-12-12 NOTE — Progress Notes (Signed)
Summary: Fish Oil  Phone Note Call from Patient   Summary of Call: Pt wants to know if it is ok to try omega 3 fish oil to help her joints.  Initial call taken by: Lamar Sprinkles, CMA,  November 18, 2009 10:26 AM  Follow-up for Phone Call        OK Follow-up by: Tresa Garter MD,  November 20, 2009 11:37 PM  Additional Follow-up for Phone Call Additional follow up Details #1::        Pt informed  Additional Follow-up by: Lamar Sprinkles, CMA,  November 21, 2009 8:53 AM

## 2010-12-12 NOTE — Assessment & Plan Note (Signed)
Summary: 3 mos f/u #/cd   Vital Signs:  Patient profile:   77 year old female Height:      64 inches Weight:      184 pounds BMI:     31.70 Temp:     98.3 degrees F oral Pulse rate:   76 / minute Pulse rhythm:   regular Resp:     16 per minute BP sitting:   140 / 84  (left arm) Cuff size:   regular  Vitals Entered By: Lanier Prude, CMA(AAMA) (September 06, 2010 7:51 AM) CC: 3 mo f/u c/o dizziness this am.  Is Patient Diabetic? Yes Comments pt needs her toenails trimmed   Primary Care Provider:  Tresa Garter MD  CC:  3 mo f/u c/o dizziness this am. .  History of Present Illness: The patient presents for a follow up of hypertension, diabetes, hyperlipidemia, OA   Allergies: 1)  ! Penicillin V Potassium (Penicillin V Potassium) 2)  ! Adult Aspirin Low Strength (Aspirin) 3)  ! Tramadol Hcl (Tramadol Hcl) 4)  ! Iodine (Iodine) 5)  ! Maxzide-25 (Triamterene-Hctz) 6)  ! Vistaril (Hydroxyzine Pamoate) 7)  Lisinopril (Lisinopril) 8)  Norvasc (Amlodipine Besylate) 9)  Metformin Hcl (Metformin Hcl) 10)  Atenolol (Atenolol)  Past History:  Past Medical History: Last updated: 04/25/2010 1) CAD     -s/p stenting of LAD 1999     --cath 5/08. EF normal. LAD 30-40% restenosis. D1 50% D2 80% LCX & RCA minimal plaque 2) HTN 3) Hyperlipidemia 4) Anemia-iron deficiency 5) Depression 6) DVT, hx of 7) Gout 8) Osteoporosis 9) Pancreatitis, hx of 10) GERD 11) Renal insufficiency, Cr 1.2-1.3 12) Obesity 13) Diabetes mellitus 14) Polyarthritis/DJDPMR 15) Vit D def. 268.9 16) B12 def. 266.2 17) Tinnitus 18) Anxiety Aneurysm-Thoracic Aortic Osteoporosis Constipation Urinary frequency  Social History: Last updated: 12/30/2008 She lives in [place], [place], with her   husband.  She is retired.  No tobacco or alcohol use.   Review of Systems       The patient complains of muscle weakness and difficulty walking.  The patient denies anorexia, dyspnea on  exertion, and abdominal pain.    Physical Exam  General:  obese eldelry AA female wo required 2+ assistance to stand and to step-up to exam table. Nose:  no external deformity and no nasal discharge.   Mouth:  no gingival abnormalities and pharynx pink and moist.   Lungs:  normal respiratory effort.   Heart:  normal rate and regular rhythm.   Abdomen:  obese Msk:  obesity including very fatty thighs and knee. Very difficult to palpate the joint line. She has a lot of tenderness to palpation at the knee B. No erythema, no effusion. B bursae ancerina are tender Extremities:  no edema, no erythema  Neurologic:  strength normal in all extremities and gait normal.   Skin:  color normal and no rashes.  I trimmed her toenails Psych:  dysphoric affect and slightly anxious.    Diabetes Management Exam:    Foot Exam (with socks and/or shoes not present):       Sensory-Pinprick/Light touch:          Left medial foot (L-4): normal          Left dorsal foot (L-5): normal          Left lateral foot (S-1): normal          Right medial foot (L-4): normal  Right dorsal foot (L-5): normal          Right lateral foot (S-1): normal       Sensory-Monofilament:          Left foot: normal          Right foot: normal       Inspection:          Left foot: normal          Right foot: normal       Nails:          Left foot: normal          Right foot: normal   Impression & Recommendations:  Problem # 1:  B12 DEFICIENCY (ICD-266.2) Assessment Unchanged On the regimen of medicine(s) reflected in the chart    Problem # 2:  CORONARY ARTERY DISEASE (ICD-414.00) Assessment: Unchanged  Her updated medication list for this problem includes:    Aspirin 81 Mg Tbec (Aspirin) ..... One by mouth every day    Losartan Potassium 100 Mg Tabs (Losartan potassium) .[name]... 1 by mouth once daily for blood pressure  Orders: TLB-A1C / Hgb A1C (Glycohemoglobin) (83036-A1C) TLB-BMP (Basic Metabolic Panel-BMET)  (80048-METABOL) TLB-CBC Platelet - w/Differential (85025-CBCD)  Problem # 3:  DIABETES MELLITUS, TYPE II (ICD-250.00) Assessment: Unchanged  Her updated medication list for this problem includes:    Metformin Hcl 500 Mg Tabs (Metformin hcl) .[name]... Take 1 tab twice daily    Aspirin 81 Mg Tbec (Aspirin) ..... One by mouth every day    Losartan Potassium 100 Mg Tabs (Losartan potassium) .[name]... 1 by mouth once daily for blood pressure  Labs Reviewed: Creat: 1.3 (06/02/2010)    Reviewed HgBA1c results: 6.5 (06/02/2010)  6.1 (01/05/2010)  Orders: TLB-A1C / Hgb A1C (Glycohemoglobin) (83036-A1C) TLB-BMP (Basic Metabolic Panel-BMET) (80048-METABOL) TLB-CBC Platelet - w/Differential (85025-CBCD)  Problem # 4:  LOW BACK PAIN (ICD-724.2) Assessment: Unchanged  Her updated medication list for this problem includes:    Hydrocodone-acetaminophen 5-325 Mg Tabs (Hydrocodone-acetaminophen) .[name]... 1 by mouth up to 2 times per day as needed for pain    Aspirin 81 Mg Tbec (Aspirin) ..... One by mouth every day  Problem # 5:  DEPRESSION (ICD-311) Assessment: Unchanged  Her updated medication list for this problem includes:    Citalopram Hydrobromide 10 Mg Tabs (Citalopram hydrobromide) .[name]... 1po once daily  Problem # 6:  ANEMIA OF CHRONIC DISEASE (ICD-285.29) Assessment: Unchanged  Her updated medication list for this problem includes:    Ferrous Sulfate 325 (65 Fe) Mg Tabs (Ferrous sulfate) .[name]... 1 two times a day    Vitamin B-12 500 Mcg Tabs (Cyanocobalamin) .[name]... 1 by mouth once daily for vitamin b12 deficiency  Complete Medication List: 1)  Metformin Hcl 500 Mg Tabs (Metformin hcl) .... Take 1 tab twice daily 2)  Triamcinolone Acetonide 0.1 % Oint (Triamcinolone acetonide) .... Use two times a day prn 3)  Hydrocodone-acetaminophen 5-325 Mg Tabs (Hydrocodone-acetaminophen) .[name].. 1 by mouth up to 2 times per day as needed for pain 4)  Onetouch Ultra Test Strp (Glucose blood) .... Use asd 1 once  daily 5)  Bd Eclipse Syringe 27g X 1/2" 1 Ml Misc (Syringe/needle (disp)) .... As dirr 6)  Ferrous Sulfate 325 (65 Fe) Mg Tabs (Ferrous sulfate) .[name].. 1 two times a day 7)  Aspirin 81 Mg Tbec (Aspirin) .... One by mouth every day 8)  Vitamin D3 1000 Unit Tabs (Cholecalciferol) .... 2 once daily 9)  Miralax Powd (Polyethylene glycol 3350) .[name].. 1 scoop  once daily as needed for constipation 10)  Vitamin B-12 500 Mcg Tabs (Cyanocobalamin) .[name].. 1 by mouth once daily for vitamin b12 deficiency 11)  Gelnique 10 % Gel (Oxybutynin chloride) .... Use on skin once daily for bladder 12)  Lancets Misc (Lancets) .... Use asd 1 once daily 13)  Citalopram Hydrobromide 10 Mg Tabs (Citalopram hydrobromide) .[name].. 1po once daily 14)  Losartan Potassium 100 Mg Tabs (Losartan potassium) .[name].. 1 by mouth once daily for blood pressure  Other Orders: Flu Vaccine 54yrs + MEDICARE PATIENTS (Z6109) Administration Flu vaccine - MCR (U0454)  Patient Instructions: 1)  Please schedule a follow-up appointment in 4 months well exam. Prescriptions: VITAMIN B-12 500 MCG TABS (CYANOCOBALAMIN) 1 by mouth once daily for Vitamin B12 deficiency  #30 x 12   Entered and Authorized by:   Tresa Garter MD   Signed by:   Tresa Garter MD on 09/06/2010   Method used:   [facility]        [facility] [phone]* (retail)       [address], [place]  [address]       Ph: [phone] or [phone]       Fax: [phone]   RxID:   [id number]    Orders Added: 1)  Est. Patient Level IV [25956] 2)  Flu Vaccine 42yrs + MEDICARE PATIENTS [Q2039] 3)  Administration Flu vaccine - MCR [G0008] 4)  TLB-A1C / Hgb A1C (Glycohemoglobin) [83036-A1C] 5)  TLB-BMP (Basic Metabolic Panel-BMET) [80048-METABOL] 6)  TLB-CBC Platelet - w/Differential [85025-CBCD]    Influenza Vaccine (to be given today)   Flu Vaccine Consent Questions     Do you have a history of severe allergic reactions to this vaccine? no     Any prior history of allergic reactions to egg and/or gelatin? no    Do you have a sensitivity to the preservative Thimersol? no    Do you have a past history of Guillan-Barre Syndrome? no    Do you currently have an acute febrile illness? no    Have you ever had a severe reaction to latex? no    Vaccine information given and explained to patient? yes    Are you currently pregnant? no    Lot Number:[id number]   Exp Date:05/12/2011   Site Given  Left Deltoid IM   Lanier Prude, [facility])  September 06, 2010 8:50 AM

## 2010-12-12 NOTE — Progress Notes (Signed)
Summary: pain med refill  Phone Note Refill Request Message from:  Fax from Pharmacy on Mar 22, 2010 4:37 PM  Refills Requested: Medication #1:  HYDROCODONE-ACETAMINOPHEN 5-325 MG TABS 1 by mouth up to 2 times per day as needed for pain Next Appointment Scheduled: 04-25-10 Initial call taken by: Lucious Groves,  Mar 22, 2010 4:37 PM  Follow-up for Phone Call        ok to ref x 2  Follow-up by: Tresa Garter MD,  Mar 22, 2010 5:31 PM    Prescriptions: HYDROCODONE-ACETAMINOPHEN 5-325 MG TABS (HYDROCODONE-ACETAMINOPHEN) 1 by mouth up to 2 times per day as needed for pain  #60 x 2   Entered by:   Lamar Sprinkles, CMA   Authorized by:   Tresa Garter MD   Signed by:   Lamar Sprinkles, CMA on 03/22/2010   Method used:   Telephoned to ...       [facility] [place] [phone]* (retail)       [phone]       [place], [place]  [address]       Ph: [phone] or [phone]       Fax: [phone]   RxID:   [id number]

## 2010-12-12 NOTE — Progress Notes (Signed)
Summary: FYI  Phone Note Call from Patient Call back at [facility] Phone [phone]   Summary of Call: Patient walked into the office. She picked up her losartan at the pharmacy- when she got home she realized that the bottle and pills were different. She didn't trust the pharmacy who, when she returned with the bottle, explained that it was a different manuf and the bottle was a stock bottle - not the normal orange rx bottle.  I explained to the patient that it was normal to change manufacturers and the pills would look different. The pharmacy should have explained this to the patient when she picked the med up - not after she got home and had to return. Pt is very unhappy with the service by the pharmacy. She will go back and pick up her rx, trusting now that this is correct.   I advised patient to look for local smaller pharmacy who may be able to provide more personal service - she agreed.  Initial call taken by: Lamar Sprinkles, CMA,  August 01, 2010 4:32 PM  Follow-up for Phone Call        Thank you! Agree Follow-up by: Tresa Garter MD,  August 01, 2010 5:32 PM

## 2010-12-12 NOTE — Progress Notes (Signed)
Summary: Benicar samples  Phone Note Call from Patient Call back at Home Phone [phone]   Summary of Call: Patient left message on triage requesting Benicar samples. Initial call taken by: Lucious Groves,  May 02, 2010 8:58 AM  Follow-up for Phone Call        Patient notified samples up front for pick up. Follow-up by: Lucious Groves,  May 02, 2010 9:21 AM

## 2010-12-12 NOTE — Miscellaneous (Signed)
Summary: Knee Injection/[place] [facility]  Knee Injection/[facility]   Imported By: Sherian Rein 06/05/2010 14:54:03  _____________________________________________________________________  External Attachment:    Type:   Image     Comment:   External Document

## 2010-12-12 NOTE — Progress Notes (Signed)
Summary: Pt?  Phone Note Call from Patient Call back at Home Phone [phone]   Caller: Patient Summary of Call: pt called stating that she wouldlike to try taking oral B-12 rather that injectable. Pt says she feels the injectable is not "helping". Please advise Initial call taken by: Margaret Pyle, CMA,  March 02, 2010 9:09 AM  Follow-up for Phone Call        ok Vit B12 500 micrograms a day Follow-up by: Tresa Garter MD,  March 02, 2010 1:06 PM  Additional Follow-up for Phone Call Additional follow up Details #1::        Pt informed  Additional Follow-up by: Lamar Sprinkles, CMA,  March 02, 2010 2:07 PM    New/Updated Medications: VITAMIN B-12 500 MCG TABS (CYANOCOBALAMIN) 1 by mouth once daily for Vitamin B12 deficiency Prescriptions: VITAMIN B-12 500 MCG TABS (CYANOCOBALAMIN) 1 by mouth once daily for Vitamin B12 deficiency  #30 x 12   Entered and Authorized by:   Tresa Garter MD   Signed by:   Lamar Sprinkles, CMA on 03/02/2010   Method used:   [facility]ron[facility] to        [facility] [phone]* (retail)       [address]       [place], [place]  [address]       Ph: [phone] or [phone]       Fax: [phone]   RxID:   [id number]

## 2010-12-12 NOTE — Assessment & Plan Note (Signed)
Summary: B12/PLOT/CD---COMING AT 2:30 PM   Nurse Visit   Allergies: 1)  ! Penicillin V Potassium (Penicillin V Potassium) 2)  ! Adult Aspirin Low Strength (Aspirin) 3)  ! Tramadol Hcl (Tramadol Hcl) 4)  ! Iodine (Iodine) 5)  ! Maxzide-25 (Triamterene-Hctz) 6)  ! Vistaril (Hydroxyzine Pamoate) 7)  Lisinopril (Lisinopril) 8)  Norvasc (Amlodipine Besylate) 9)  Metformin Hcl (Metformin Hcl) 10)  Atenolol (Atenolol)  Medication Administration  Injection # 1:    Medication: Vit B12 1000 mcg    Diagnosis: B12 DEFICIENCY (ICD-266.2)    Route: IM    Site: L deltoid    Exp Date: 10/2011    Lot #: [id number]    Mfr: [employer]ent    Patient tolerated injection without complications    Given by: Ami Bullins CMA (December 13, 2009 2:38 PM)  Orders Added: 1)  Admin of Therapeutic Inj  intramuscular or subcutaneous [96045]

## 2010-12-12 NOTE — Progress Notes (Signed)
Summary: Rf Hydroco-Acetamin  Phone Note Refill Request Message from:  Fax from Pharmacy  Refills Requested: Medication #1:  HYDROCODONE-ACETAMINOPHEN 5-325 MG TABS 1 by mouth up to 2 times per day as needed for pain   Dosage confirmed as above?Dosage Confirmed   Supply Requested: 60   Last Refilled: 08/15/2010  Method Requested: Telephone to Pharmacy Next Appointment Scheduled: 01-10-11 Initial call taken by: Lanier Prude, [facility]),  September 25, 2010 8:20 AM  Follow-up for Phone Call        ok to ref Follow-up by: Tresa Garter MD,  September 25, 2010 8:56 PM  Additional Follow-up for Phone Call Additional follow up Details #1::        Rx called to pharmacy Additional Follow-up by: Lanier Prude, [facility]),  September 26, 2010 8:16 AM    Prescriptions: HYDROCODONE-ACETAMINOPHEN 5-325 MG TABS (HYDROCODONE-ACETAMINOPHEN) 1 by mouth up to 2 times per day as needed for pain  #60 x 0   Entered by:   Lanier Prude, [facility])   Authorized by:   Tresa Garter MD   Signed by:   Lanier Prude, CMA(AAMA) on 09/26/2010   Method used:   Telephoned to ...       [facility] [place] [phone]* (retail)       [phone]       [place], [place]  [address]       Ph: [phone] or [phone]       Fax: [phone]   RxID:   [id number]

## 2010-12-12 NOTE — Progress Notes (Signed)
Summary: URINARY PROBLEM APPT   ---- Converted from flag ---- ---- 03/16/2010 8:40 AM, Lamar Sprinkles, CMA wrote: Pt left vm that she needs an apt for urinary problems. Please schedule pt for today w/plot  thanks ------------------------------ Gave pt appt--03/16/10 @ 115p w/ Dr Lorrine Kin

## 2010-12-12 NOTE — Progress Notes (Signed)
Summary: Rf Hydrocod/Acetamin  Phone Note Refill Request Message from:  Fax from Pharmacy  Refills Requested: Medication #1:  HYDROCODONE-ACETAMINOPHEN 5-325 MG TABS 1 by mouth up to 2 times per day as needed for pain   Dosage confirmed as above?Dosage Confirmed   Supply Requested: 60   Last Refilled: 06/02/2010  Method Requested: Telephone to Pharmacy Next Appointment Scheduled: 09-06-10 Initial call taken by: Lanier Prude, [facility]),  August 15, 2010 8:42 AM  Follow-up for Phone Call        ok to ref  Follow-up by: Tresa Garter MD,  August 15, 2010 1:00 PM  Additional Follow-up for Phone Call Additional follow up Details #1::        Rx called to pharmacy Additional Follow-up by: Lanier Prude, [facility]),  August 15, 2010 3:57 PM    Prescriptions: HYDROCODONE-ACETAMINOPHEN 5-325 MG TABS (HYDROCODONE-ACETAMINOPHEN) 1 by mouth up to 2 times per day as needed for pain  #60 x 0   Entered by:   Lanier Prude, CMA(AAMA)   Authorized by:   Tresa Garter MD   Signed by:   Lanier Prude, CMA(AAMA) on 08/15/2010   Method used:   Telephoned to ...       [facility] [place] [phone][facility] (retail)       [phone]       [place], [place]  [address]       Ph: [phone] or [phone]       Fax: [phone]   RxID:   [id number]

## 2010-12-12 NOTE — Assessment & Plan Note (Signed)
Summary: 3 mos f/u / # / cd   Vital Signs:  Patient profile:   77 year old female Height:      64 inches Weight:      189.25 pounds BMI:     32.60 O2 Sat:      96 % on Room air Temp:     97.3 degrees F oral Pulse rate:   84 / minute BP sitting:   144 / 80  (left arm) Cuff size:   large  Vitals Entered By: Lucious Groves (April 25, 2010 8:19 AM)  O2 Flow:  Room air  Procedure Note  Injections: The patient complains of pain and tenderness. Duration of symptoms: 1 month Indication: acute pain Consent signed: yes  Procedure # 1: trigger point injection    Region: posterior    Location: L and R deltoids    Technique: 24 g needle    Medication: 10 mg depomedrol    Anesthesia: 1.0 ml 1% lidocaine w/o epinephrine    Comment: Risks vs benefits and controversies of a long term controlled substances use were discussed. Two triggers on each side were injected. Tolerated well. Complicatons - none. Good pain relief following the procedure.   Cleaned and prepped with: alcohol and betadine Wound dressing: bandaid  CC: 3 mo f/u./kb Is Patient Diabetic? Yes Pain Assessment Patient in pain? no        CC:  3 mo f/u./kb.  History of Present Illness: The patient presents for a follow up of back pain, anxiety, depression and headaches. C/o yeast infection in vagina C/o pain in B poster shoulders - asked for injections  Current Medications (verified): 1)  Metformin Hcl 500 Mg Tabs (Metformin Hcl) .... Take 1 Tab Twice Daily 2)  Triamcinolone Acetonide 0.1 % Oint (Triamcinolone Acetonide) .... Use Two Times A Day Prn 3)  Benicar 40 Mg  Tabs (Olmesartan Medoxomil) .[name].. 1 By Mouth Once Daily 4)  Hydrocodone-Acetaminophen 5-325 Mg Tabs (Hydrocodone-Acetaminophen) .[name].. 1 By Mouth Up To 2 Times Per Day As Needed For Pain 5)  Accu-Chek Aviva  Strp (Glucose Blood) .[name].. 1 Once Daily Prn 6)  Bd Eclipse Syringe 27g X 1/2" 1 Ml Misc (Syringe/needle (Disp)) .... As Dirr 7)  Ferrous Sulfate 325  (65 Fe) Mg Tabs (Ferrous Sulfate) .[name].. 1 Two Times A Day 8)  Aspirin 81 Mg  Tbec (Aspirin) .... One By Mouth Every Day 9)  Vitamin D3 1000 Unit  Tabs (Cholecalciferol) .... 2 Once Daily 10)  Miralax  Powd (Polyethylene Glycol 3350) .[name].. 1 Scoop Once Daily As Needed For Constipation 11)  Vitamin B-12 500 Mcg Tabs (Cyanocobalamin) .[name].. 1 By Mouth Once Daily For Vitamin B12 Deficiency  Allergies (verified): 1)  ! Penicillin V Potassium (Penicillin V Potassium) 2)  ! Adult Aspirin Low Strength (Aspirin) 3)  ! Tramadol Hcl (Tramadol Hcl) 4)  ! Iodine (Iodine) 5)  ! Maxzide-25 (Triamterene-Hctz) 6)  ! Vistaril (Hydroxyzine Pamoate) 7)  Lisinopril (Lisinopril) 8)  Norvasc (Amlodipine Besylate) 9)  Metformin Hcl (Metformin Hcl) 10)  Atenolol (Atenolol)  Past History:  Past Surgical History: Last updated: 12/07/2007 Hemorrhoidectomy Hysterectomy s/p Taxus stent Cholecystectomy Tubal ligation  Social History: Last updated: 12/30/2008 She lives in [place], [place], with her   husband.  She is retired.  No tobacco or alcohol use.   Past Medical History: 1) CAD     -s/p stenting of LAD 1999     --cath 5/08. EF normal. LAD 30-40% restenosis. D1 50% D2 80% LCX & RCA  minimal plaque 2) HTN 3) Hyperlipidemia 4) Anemia-iron deficiency 5) Depression 6) DVT, hx of 7) Gout 8) Osteoporosis 9) Pancreatitis, hx of 10) GERD 11) Renal insufficiency, Cr 1.2-1.3 12) Obesity 13) Diabetes mellitus 14) Polyarthritis/DJDPMR 15) Vit D def. 268.9 16) B12 def. 266.2 17) Tinnitus 18) Anxiety Aneurysm-Thoracic Aortic Osteoporosis Constipation Urinary frequency  Physical Exam  General:  NAD, slow overweight-appearing.   Nose:  External nasal examination shows no deformity or inflammation. Nasal mucosa are pink and moist without lesions or exudates. Mouth:  Oral mucosa and oropharynx without lesions or exudates.  Teeth in good repair. Neck:  No deformities, masses, or tenderness  noted. Lungs:  Normal respiratory effort, chest expands symmetrically. Lungs are clear to auscultation, no crackles or wheezes. Heart:  1-2/6 murmur Abdomen:  Bowel sounds positive,abdomen soft and non-tender without masses, organomegaly or hernias noted. Msk:  L hammer toe Lumbar-sacral spine is tender to palpation over paraspinal muscles and painfull with the ROM  Very slow Tender poster deltoids B Extremities:  No clubbing, cyanosis, edema, or deformity noted with normal full range of motion of all joints.   Neurologic:  No cranial nerve deficits noted. Station and gait are ataxic. Plantar reflexes are down-going bilaterally. DTRs are symmetrical throughout. Sensory, motor and coordinative functions appear intact. Skin:  B feet ok Psych:  Oriented X3 and subdued.     Impression & Recommendations:  Problem # 1:  VAGINITIS (ICD-616.10) Assessment Comment Only Given Rx  Problem # 2:  B12 DEFICIENCY (ICD-266.2) Assessment: Comment Only On prescription drug  therapy   Problem # 3:  LOW BACK PAIN (ICD-724.2) Assessment: Unchanged  Her updated medication list for this problem includes:    Hydrocodone-acetaminophen 5-325 Mg Tabs (Hydrocodone-acetaminophen) .[name]... 1 by mouth up to 2 times per day as needed for pain    Aspirin 81 Mg Tbec (Aspirin) ..... One by mouth every day  Problem # 4:  DIABETES MELLITUS, TYPE II (ICD-250.00) Warned re: elev glu after steroid shot Her updated medication list for this problem includes:    Metformin Hcl 500 Mg Tabs (Metformin hcl) .[name]... Take 1 tab twice daily    Benicar 40 Mg Tabs (Olmesartan medoxomil) .[name]... 1 by mouth once daily    Aspirin 81 Mg Tbec (Aspirin) ..... One by mouth every day  Problem # 5:  SHOULDER PAIN (ICD-719.41) L>R Assessment: New  Her updated medication list for this problem includes:    Hydrocodone-acetaminophen 5-325 Mg Tabs (Hydrocodone-acetaminophen) .[name]... 1 by mouth up to 2 times per day as needed for pain    Aspirin 81  Mg Tbec (Aspirin) ..... One by mouth every day  Complete Medication List: 1)  Metformin Hcl 500 Mg Tabs (Metformin hcl) .... Take 1 tab twice daily 2)  Triamcinolone Acetonide 0.1 % Oint (Triamcinolone acetonide) .... Use two times a day prn 3)  Benicar 40 Mg Tabs (Olmesartan medoxomil) .[name].. 1 by mouth once daily 4)  Hydrocodone-acetaminophen 5-325 Mg Tabs (Hydrocodone-acetaminophen) .[name].. 1 by mouth up to 2 times per day as needed for pain 5)  Accu-chek Aviva Strp (Glucose blood) .[name].. 1 once daily prn 6)  Bd Eclipse Syringe 27g X 1/2" 1 Ml Misc (Syringe/needle (disp)) .... As dirr 7)  Ferrous Sulfate 325 (65 Fe) Mg Tabs (Ferrous sulfate) .[name].. 1 two times a day 8)  Aspirin 81 Mg Tbec (Aspirin) .... One by mouth every day 9)  Vitamin D3 1000 Unit Tabs (Cholecalciferol) .... 2 once daily 10)  Miralax Powd (Polyethylene glycol 3350) .[name].. 1 scoop  once daily as needed for constipation 11)  Vitamin B-12 500 Mcg Tabs (Cyanocobalamin) .[name].. 1 by mouth once daily for vitamin b12 deficiency 12)  Gelnique 10 % Gel (Oxybutynin chloride) .... Use on skin once daily for bladder 13)  Diflucan 100 Mg Tabs (Fluconazole) .... Take two tablets on the first day, than  1 by mouth once daily untill gone for a fungul infection  Other Orders: Trigger Point Injection (1 or 2 muscles) (16109) Depo- Medrol 40mg  (J1030)  Patient Instructions: 1)  Please schedule a follow-up appointment in 2 months. Prescriptions: DIFLUCAN 100 MG TABS (FLUCONAZOLE) Take two tablets on the first day, than  1 by mouth once daily untill gone for a fungul infection  #8 x 1   Entered and Authorized by:   Tresa Garter MD   Signed by:   Tresa Garter MD on 04/25/2010   Method used:   Print then Give to Patient   RxID:   [id number] GELNIQUE 10 % GEL (OXYBUTYNIN CHLORIDE) use on skin once daily for bladder  #30 x 12   Entered and Authorized by:   Tresa Garter MD   Signed by:   Tresa Garter MD on 04/25/2010    Method used:   Print then Give to Patient   RxID:   [id number]

## 2010-12-12 NOTE — Assessment & Plan Note (Signed)
Summary: 4 MO ROV /NWS #   Vital Signs:  Patient profile:   77 year old female Weight:      194 pounds Temp:     96.9 degrees F oral Pulse rate:   77 / minute BP sitting:   130 / 84  (left arm)  Vitals Entered By: Tora Perches (January 05, 2010 7:57 AM) CC: f/u Is Patient Diabetic? Yes   CC:  f/u.  History of Present Illness: The patient presents for a follow up of hypertension, diabetes, hyperlipidemia C/o knee pain B, fatigue  Preventive Screening-Counseling & Management  Alcohol-Tobacco     Smoking Status: never  Current Medications (verified): 1)  Metformin Hcl 500 Mg Tabs (Metformin Hcl) .... Take 1 Tab Twice Daily 2)  Triamcinolone Acetonide 0.1 % Oint (Triamcinolone Acetonide) .... Use Two Times A Day Prn 3)  Benicar 40 Mg  Tabs (Olmesartan Medoxomil) .[name].. 1 By Mouth Once Daily 4)  Hydrocodone-Acetaminophen 5-325 Mg Tabs (Hydrocodone-Acetaminophen) .[name].. 1 By Mouth Up To 2 Times Per Day As Needed For Pain 5)  Accu-Chek Aviva  Strp (Glucose Blood) .[name].. 1 Once Daily Prn 6)  Cobal-1000 1000 Mcg/ml Soln (Cyanocobalamin) .[name].. 1 Ml Subcutaneously Q 2 Wks 7)  Bd Eclipse Syringe 27g X 1/2" 1 Ml Misc (Syringe/needle (Disp)) .... As Dirr 8)  Ferrous Sulfate 325 (65 Fe) Mg Tabs (Ferrous Sulfate) .[name].. 1 Two Times A Day 9)  Aspirin 81 Mg  Tbec (Aspirin) .... One By Mouth Every Day 10)  Vitamin D3 1000 Unit  Tabs (Cholecalciferol) .... 2 Once Daily  Allergies: 1)  ! Penicillin V Potassium (Penicillin V Potassium) 2)  ! Adult Aspirin Low Strength (Aspirin) 3)  ! Tramadol Hcl (Tramadol Hcl) 4)  ! Iodine (Iodine) 5)  ! Maxzide-25 (Triamterene-Hctz) 6)  ! Vistaril (Hydroxyzine Pamoate) 7)  Lisinopril (Lisinopril) 8)  Norvasc (Amlodipine Besylate) 9)  Metformin Hcl (Metformin Hcl) 10)  Atenolol (Atenolol)  Past History:  Past Medical History: Last updated: 01/17/2009 1) CAD     -s/p stenting of LAD 1999     --cath 5/08. EF normal. LAD 30-40% restenosis. D1 50% D2 80% LCX  & RCA minimal plaque 2) HTN 3) Hyperlipidemia 4) Anemia-iron deficiency 5) Depression 6) DVT, hx of 7) Gout 8) Osteoporosis 9) Pancreatitis, hx of 10) GERD 11) Renal insufficiency, Cr 1.2-1.3 12) Obesity 13) Diabetes mellitus 14) Polyarthritis/DJDPMR 15) Vit D def. 268.9 16) B12 def. 266.2 17) Tinnitus 18) Anxiety Aneurysm-Thoracic Aortic Osteoporosis  Past Surgical History: Last updated: 12/07/2007 Hemorrhoidectomy Hysterectomy s/p Taxus stent Cholecystectomy Tubal ligation  Social History: Last updated: 12/30/2008 She lives in [place], [place], with her   husband.  She is retired.  No tobacco or alcohol use.   Review of Systems       The patient complains of abdominal pain.  The patient denies fever and chest pain.    Physical Exam  General:  NAD, slow overweight-appearing.   Nose:  External nasal examination shows no deformity or inflammation. Nasal mucosa are pink and moist without lesions or exudates. Mouth:  Oral mucosa and oropharynx without lesions or exudates.  Teeth in good repair. Lungs:  Normal respiratory effort, chest expands symmetrically. Lungs are clear to auscultation, no crackles or wheezes. Heart:  1-2/6 murmur Abdomen:  Bowel sounds positive,abdomen soft and non-tender without masses, organomegaly or hernias noted. Msk:  L hammer toe Lumbar-sacral spine is tender to palpation over paraspinal muscles and painfull with the ROM  Very slow Extremities:  No clubbing, cyanosis, edema,  or deformity noted with normal full range of motion of all joints.   Neurologic:  No cranial nerve deficits noted. Station and gait are ataxic. Plantar reflexes are down-going bilaterally. DTRs are symmetrical throughout. Sensory, motor and coordinative functions appear intact. Skin:  B feet ok Psych:  Oriented X3 and subdued.     Impression & Recommendations:  Problem # 1:  B12 DEFICIENCY (ICD-266.2) Assessment Comment Only  On prescription therapy     Orders: Vit B12 1000 mcg (J3420) Admin of Therapeutic Inj  intramuscular or subcutaneous (02637) TLB-BMP (Basic Metabolic Panel-BMET) (80048-METABOL) TLB-CBC Platelet - w/Differential (85025-CBCD) TLB-TSH (Thyroid Stimulating Hormone) (84443-TSH) TLB-B12, Serum-Total ONLY (82607-B12) TLB-A1C / Hgb A1C (Glycohemoglobin) (83036-A1C) TLB-Hepatic/Liver Function Pnl (80076-HEPATIC) TLB-Lipid Panel (80061-LIPID)  Problem # 2:  LOW BACK PAIN (ICD-724.2) Assessment: Unchanged  Her updated medication list for this problem includes:    Hydrocodone-acetaminophen 5-325 Mg Tabs (Hydrocodone-acetaminophen) .[name]... 1 by mouth up to 2 times per day as needed for pain    Aspirin 81 Mg Tbec (Aspirin) ..... One by mouth every day  Problem # 3:  FATIGUE (ICD-780.79) Assessment: Unchanged  Orders: TLB-BMP (Basic Metabolic Panel-BMET) (80048-METABOL) TLB-CBC Platelet - w/Differential (85025-CBCD) TLB-TSH (Thyroid Stimulating Hormone) (84443-TSH) TLB-B12, Serum-Total ONLY (82607-B12) TLB-A1C / Hgb A1C (Glycohemoglobin) (83036-A1C) TLB-Hepatic/Liver Function Pnl (80076-HEPATIC) TLB-Lipid Panel (80061-LIPID)  Problem # 4:  DIABETES MELLITUS, TYPE II (ICD-250.00) Assessment: Comment Only  Her updated medication list for this problem includes:    Metformin Hcl 500 Mg Tabs (Metformin hcl) .[name]... Take 1 tab twice daily    Benicar 40 Mg Tabs (Olmesartan medoxomil) .[name]... 1 by mouth once daily    Aspirin 81 Mg Tbec (Aspirin) ..... One by mouth every day  Orders: TLB-BMP (Basic Metabolic Panel-BMET) (80048-METABOL) TLB-CBC Platelet - w/Differential (85025-CBCD) TLB-TSH (Thyroid Stimulating Hormone) (84443-TSH) TLB-B12, Serum-Total ONLY (82607-B12) TLB-A1C / Hgb A1C (Glycohemoglobin) (83036-A1C) TLB-Hepatic/Liver Function Pnl (80076-HEPATIC) TLB-Lipid Panel (80061-LIPID)  Problem # 5:  RENAL INSUFFICIENCY (ICD-588.9) Assessment: Comment Only  Complete Medication List: 1)  Metformin Hcl 500 Mg Tabs  (Metformin hcl) .... Take 1 tab twice daily 2)  Triamcinolone Acetonide 0.1 % Oint (Triamcinolone acetonide) .... Use two times a day prn 3)  Benicar 40 Mg Tabs (Olmesartan medoxomil) .[name].. 1 by mouth once daily 4)  Hydrocodone-acetaminophen 5-325 Mg Tabs (Hydrocodone-acetaminophen) .[name].. 1 by mouth up to 2 times per day as needed for pain 5)  Accu-chek Aviva Strp (Glucose blood) .[name].. 1 once daily prn 6)  Cobal-1000 1000 Mcg/ml Soln (Cyanocobalamin) .[name].. 1 ml subcutaneously q 2 wks 7)  Bd Eclipse Syringe 27g X 1/2" 1 Ml Misc (Syringe/needle (disp)) .... As dirr 8)  Ferrous Sulfate 325 (65 Fe) Mg Tabs (Ferrous sulfate) .[name].. 1 two times a day 9)  Aspirin 81 Mg Tbec (Aspirin) .... One by mouth every day 10)  Vitamin D3 1000 Unit Tabs (Cholecalciferol) .... 2 once daily  Patient Instructions: 1)  Please schedule a follow-up appointment in 4 months.     Medication Administration  Injection # 1:    Medication: Vit B12 1000 mcg    Diagnosis: B12 DEFICIENCY (ICD-266.2)    Route: IM    Site: R deltoid    Exp Date: 09/2011    Lot #: [id number]    Mfr: [employer]    Comments: given    Patient tolerated injection without complications    Given by: Tora Perches (January 05, 2010 8:36 AM)  Orders Added: 1)  Est. Patient Level IV [85885] 2)  Vit B12 1000  mcg [J3420] 3)  Admin of Therapeutic Inj  intramuscular or subcutaneous [96372] 4)  TLB-BMP (Basic Metabolic Panel-BMET) [80048-METABOL] 5)  TLB-CBC Platelet - w/Differential [85025-CBCD] 6)  TLB-TSH (Thyroid Stimulating Hormone) [84443-TSH] 7)  TLB-B12, Serum-Total ONLY [82607-B12] 8)  TLB-A1C / Hgb A1C (Glycohemoglobin) [83036-A1C] 9)  TLB-Hepatic/Liver Function Pnl [80076-HEPATIC] 10)  TLB-Lipid Panel [80061-LIPID]

## 2010-12-12 NOTE — Progress Notes (Signed)
Summary: Samples  Phone Note Call from Patient   Summary of Call: Pt called req samples of benicar.  Initial call taken by: Lamar Sprinkles, CMA,  December 09, 2009 1:08 PM  Follow-up for Phone Call        Patient notified and samples placed up front Follow-up by: Rock Nephew CMA,  December 13, 2009 10:01 AM

## 2010-12-12 NOTE — Assessment & Plan Note (Signed)
Summary: URINARY PROBLEM--PER SARA/FLAG---STC   Vital Signs:  Patient profile:   77 year old female Height:      64 inches Weight:      193.50 pounds BMI:     33.33 O2 Sat:      96 % on Room air Temp:     98.7 degrees F oral Pulse rate:   86 / minute BP sitting:   140 / 58  (left arm) Cuff size:   large  Vitals Entered By: Lucious Groves (Mar 16, 2010 1:31 PM)  O2 Flow:  Room air CC: C/O increased vaginal itching and increased urination frequency. Pt confirms dysuria./kb Is Patient Diabetic? Yes Pain Assessment Patient in pain? no        CC:  C/O increased vaginal itching and increased urination frequency. Pt confirms dysuria./kb.  History of Present Illness: C/o frequency and burning x 2 d; can't hold water  Current Medications (verified): 1)  Metformin Hcl 500 Mg Tabs (Metformin Hcl) .... Take 1 Tab Twice Daily 2)  Triamcinolone Acetonide 0.1 % Oint (Triamcinolone Acetonide) .... Use Two Times A Day Prn 3)  Benicar 40 Mg  Tabs (Olmesartan Medoxomil) .[name].. 1 By Mouth Once Daily 4)  Hydrocodone-Acetaminophen 5-325 Mg Tabs (Hydrocodone-Acetaminophen) .[name].. 1 By Mouth Up To 2 Times Per Day As Needed For Pain 5)  Accu-Chek Aviva  Strp (Glucose Blood) .[name].. 1 Once Daily Prn 6)  Bd Eclipse Syringe 27g X 1/2" 1 Ml Misc (Syringe/needle (Disp)) .... As Dirr 7)  Ferrous Sulfate 325 (65 Fe) Mg Tabs (Ferrous Sulfate) .[name].. 1 Two Times A Day 8)  Aspirin 81 Mg  Tbec (Aspirin) .... One By Mouth Every Day 9)  Vitamin D3 1000 Unit  Tabs (Cholecalciferol) .... 2 Once Daily 10)  Miralax  Powd (Polyethylene Glycol 3350) .[name].. 1 Scoop Once Daily As Needed For Constipation 11)  Vitamin B-12 500 Mcg Tabs (Cyanocobalamin) .[name].. 1 By Mouth Once Daily For Vitamin B12 Deficiency  Allergies (verified): 1)  ! Penicillin V Potassium (Penicillin V Potassium) 2)  ! Adult Aspirin Low Strength (Aspirin) 3)  ! Tramadol Hcl (Tramadol Hcl) 4)  ! Iodine (Iodine) 5)  ! Maxzide-25 (Triamterene-Hctz) 6)  !  Vistaril (Hydroxyzine Pamoate) 7)  Lisinopril (Lisinopril) 8)  Norvasc (Amlodipine Besylate) 9)  Metformin Hcl (Metformin Hcl) 10)  Atenolol (Atenolol)  Past History:  Past Medical History: Last updated: 01/17/2010 1) CAD     -s/p stenting of LAD 1999     --cath 5/08. EF normal. LAD 30-40% restenosis. D1 50% D2 80% LCX & RCA minimal plaque 2) HTN 3) Hyperlipidemia 4) Anemia-iron deficiency 5) Depression 6) DVT, hx of 7) Gout 8) Osteoporosis 9) Pancreatitis, hx of 10) GERD 11) Renal insufficiency, Cr 1.2-1.3 12) Obesity 13) Diabetes mellitus 14) Polyarthritis/DJDPMR 15) Vit D def. 268.9 16) B12 def. 266.2 17) Tinnitus 18) Anxiety Aneurysm-Thoracic Aortic Osteoporosis Constipation  Physical Exam  General:  NAD, slow overweight-appearing.   Abdomen:  Bowel sounds positive,abdomen soft and non-tender without masses, organomegaly or hernias noted. Msk:  L hammer toe Lumbar-sacral spine is tender to palpation over paraspinal muscles and painfull with the ROM  Very slow Skin:  B feet ok   Impression & Recommendations:  Problem # 1:  FREQUENCY, URINARY (ICD-788.41) - poss UTI Assessment New Cipro if abn UA Orders: TLB-Udip ONLY (81003-UDIP)  Complete Medication List: 1)  Metformin Hcl 500 Mg Tabs (Metformin hcl) .... Take 1 tab twice daily 2)  Triamcinolone Acetonide 0.1 % Oint (Triamcinolone acetonide) .... Use  two times a day prn 3)  Benicar 40 Mg Tabs (Olmesartan medoxomil) .[name].. 1 by mouth once daily 4)  Hydrocodone-acetaminophen 5-325 Mg Tabs (Hydrocodone-acetaminophen) .[name].. 1 by mouth up to 2 times per day as needed for pain 5)  Accu-chek Aviva Strp (Glucose blood) .[name].. 1 once daily prn 6)  Bd Eclipse Syringe 27g X 1/2" 1 Ml Misc (Syringe/needle (disp)) .... As dirr 7)  Ferrous Sulfate 325 (65 Fe) Mg Tabs (Ferrous sulfate) .[name].. 1 two times a day 8)  Aspirin 81 Mg Tbec (Aspirin) .... One by mouth every day 9)  Vitamin D3 1000 Unit Tabs (Cholecalciferol) ....  2 once daily 10)  Miralax Powd (Polyethylene glycol 3350) .[name].. 1 scoop once daily as needed for constipation 11)  Vitamin B-12 500 Mcg Tabs (Cyanocobalamin) .[name].. 1 by mouth once daily for vitamin b12 deficiency 12)  Ciprofloxacin Hcl 250 Mg Tabs (Ciprofloxacin hcl) .[name].. 1 by mouth two times a day for cystitis  Patient Instructions: 1)  Take Vesicare or Enablex one a day as needed for bladder problem 2)  Call if you are not better in a reasonable amount of time or if worse.  Prescriptions: CIPROFLOXACIN HCL 250 MG TABS (CIPROFLOXACIN HCL) 1 by mouth two times a day for cystitis  #10 x 0   Entered and Authorized by:   Tresa Garter MD   Signed by:   Tresa Garter MD on 03/16/2010   Method used:   Electronically to        [facility]. [phone]* (retail)       [address]       [place], [place]  [address]       Ph: [phone] or [phone]       Fax: [phone]   RxID:   [id number]

## 2010-12-12 NOTE — Assessment & Plan Note (Signed)
Summary: 3 WK ROV /PER JWJ /NWS   Vital Signs:  Patient profile:   77 year old female Height:      64 inches Weight:      185 pounds BMI:     31.87 O2 Sat:      96 % on Room air Temp:     97.7 degrees F oral Pulse rate:   88 / minute Pulse rhythm:   regular BP sitting:   130 / 78  (left arm) Cuff size:   large  Vitals Entered By: Lanier Prude, CMA(AAMA) (June 02, 2010 11:23 AM)  O2 Flow:  Room air  Procedure Note  Injections: The patient complains of pain and tenderness. Indication: chronic pain Consent signed: yes  Procedure # 1: joint injection    Region: medial    Location: L knee B. ancerina     Technique: 24 g needle    Medication: 40 mg depomedrol    Anesthesia: 2.0 ml 1% lidocaine w/o epinephrine  Procedure # 2: joint injection    Region: medial    Location: R knee B ancerina    Technique: 24 g needle    Medication: 40 mg depomedrol    Anesthesia: 2.0 ml 1% lidocaine w/o epinephrine    Comment: Risks including but not limited by incomplete procedure, bleeding, infection, recurrence were discussed with the patient. Consent form was signed.  Tolerated well. Complicatons - none. Good pain relief following the procedure.   Cleaned and prepped with: alcohol and betadine Wound dressing: bandaid  CC: f/u knee pain Is Patient Diabetic? Yes   Primary Care Provider:  Tresa Garter MD  CC:  f/u knee pain.  History of Present Illness: C/o B knee pain - worse w/walking and mod. to severe lately F/u shoulder pain - better F/u Vit B12 def, LBP  Current Medications (verified): 1)  Metformin Hcl 500 Mg Tabs (Metformin Hcl) .... Take 1 Tab Twice Daily 2)  Triamcinolone Acetonide 0.1 % Oint (Triamcinolone Acetonide) .... Use Two Times A Day Prn 3)  Benicar 40 Mg  Tabs (Olmesartan Medoxomil) .[name].. 1 By Mouth Once Daily 4)  Hydrocodone-Acetaminophen 5-325 Mg Tabs (Hydrocodone-Acetaminophen) .[name].. 1 By Mouth Up To 2 Times Per Day As Needed For Pain 5)  Onetouch  Ultra Test  Strp (Glucose Blood) .... Use Asd 1 Once Daily 6)  Bd Eclipse Syringe 27g X 1/2" 1 Ml Misc (Syringe/needle (Disp)) .... As Dirr 7)  Ferrous Sulfate 325 (65 Fe) Mg Tabs (Ferrous Sulfate) .[name].. 1 Two Times A Day 8)  Aspirin 81 Mg  Tbec (Aspirin) .... One By Mouth Every Day 9)  Vitamin D3 1000 Unit  Tabs (Cholecalciferol) .... 2 Once Daily 10)  Miralax  Powd (Polyethylene Glycol 3350) .[name].. 1 Scoop Once Daily As Needed For Constipation 11)  Vitamin B-12 500 Mcg Tabs (Cyanocobalamin) .[name].. 1 By Mouth Once Daily For Vitamin B12 Deficiency 12)  Gelnique 10 % Gel (Oxybutynin Chloride) .... Use On Skin Once Daily For Bladder 13)  Lancets  Misc (Lancets) .... Use Asd 1 Once Daily 14)  Citalopram Hydrobromide 10 Mg Tabs (Citalopram Hydrobromide) .[name].. 1po Once Daily  Allergies (verified): 1)  ! Penicillin V Potassium (Penicillin V Potassium) 2)  ! Adult Aspirin Low Strength (Aspirin) 3)  ! Tramadol Hcl (Tramadol Hcl) 4)  ! Iodine (Iodine) 5)  ! Maxzide-25 (Triamterene-Hctz) 6)  ! Vistaril (Hydroxyzine Pamoate) 7)  Lisinopril (Lisinopril) 8)  Norvasc (Amlodipine Besylate) 9)  Metformin Hcl (Metformin Hcl) 10)  Atenolol (Atenolol)  Past  History:  Past Medical History: Last updated: 04/25/2010 1) CAD     -s/p stenting of LAD 1999     --cath 5/08. EF normal. LAD 30-40% restenosis. D1 50% D2 80% LCX & RCA minimal plaque 2) HTN 3) Hyperlipidemia 4) Anemia-iron deficiency 5) Depression 6) DVT, hx of 7) Gout 8) Osteoporosis 9) Pancreatitis, hx of 10) GERD 11) Renal insufficiency, Cr 1.2-1.3 12) Obesity 13) Diabetes mellitus 14) Polyarthritis/DJDPMR 15) Vit D def. 268.9 16) B12 def. 266.2 17) Tinnitus 18) Anxiety Aneurysm-Thoracic Aortic Osteoporosis Constipation Urinary frequency  Past Surgical History: Last updated: 12/07/2007 Hemorrhoidectomy Hysterectomy s/p Taxus stent Cholecystectomy Tubal ligation  Social History: Last updated: 12/30/2008 She lives in  [place], [place], with her   husband.  She is retired.  No tobacco or alcohol use.   Review of Systems  The patient denies fever, chest pain, abdominal pain, melena, and hematochezia.    Physical Exam  General:  obese eldelry AA female wo required 2+ assistance to stand and to step-up to exam table. Head:  Normocephalic and atraumatic without obvious abnormalities. No apparent alopecia or balding. Nose:  no external deformity and no nasal discharge.   Mouth:  no gingival abnormalities and pharynx pink and moist.   Neck:  supple and full ROM.   Lungs:  normal respiratory effort.   Heart:  normal rate and regular rhythm.   Abdomen:  obese Msk:  obesity including very fatty thighs and knee. Very difficult to palpate the joint line. She has a lot of tenderness to palpation at the knee B. No erythema, no effusion. B bursae ancerina are tender Extremities:  no edema, no erythema  Neurologic:  strength normal in all extremities and gait normal.   Skin:  color normal and no rashes.   Psych:  dysphoric affect and slightly anxious.     Impression & Recommendations:  Problem # 1:  KNEE PAIN, CHRONIC (ICD-719.46) OA B and B. ancerina bursitis Assessment Deteriorated  Her updated medication list for this problem includes:    Hydrocodone-acetaminophen 5-325 Mg Tabs (Hydrocodone-acetaminophen) .[name]... 1 by mouth up to 2 times per day as needed for pain    Aspirin 81 Mg Tbec (Aspirin) ..... One by mouth every day  Problem # 2:  SHOULDER PAIN (ICD-719.41) Assessment: Improved  Her updated medication list for this problem includes:    Hydrocodone-acetaminophen 5-325 Mg Tabs (Hydrocodone-acetaminophen) .[name]... 1 by mouth up to 2 times per day as needed for pain    Aspirin 81 Mg Tbec (Aspirin) ..... One by mouth every day  Problem # 3:  B12 DEFICIENCY (ICD-266.2) Assessment: Improved  Orders: TLB-B12, Serum-Total ONLY (82607-B12) TLB-A1C / Hgb A1C (Glycohemoglobin) (83036-A1C) TLB-BMP  (Basic Metabolic Panel-BMET) (80048-METABOL) TLB-CBC Platelet - w/Differential (85025-CBCD) TLB-Hepatic/Liver Function Pnl (80076-HEPATIC) TLB-TSH (Thyroid Stimulating Hormone) (84443-TSH) T-Vitamin D (25-Hydroxy) (24401-02725)  Problem # 4:  LOW BACK PAIN (ICD-724.2) Assessment: Unchanged  Her updated medication list for this problem includes:    Hydrocodone-acetaminophen 5-325 Mg Tabs (Hydrocodone-acetaminophen) .[name]... 1 by mouth up to 2 times per day as needed for pain    Aspirin 81 Mg Tbec (Aspirin) ..... One by mouth every day  Orders: TLB-B12, Serum-Total ONLY (82607-B12) TLB-A1C / Hgb A1C (Glycohemoglobin) (83036-A1C) TLB-BMP (Basic Metabolic Panel-BMET) (80048-METABOL) TLB-CBC Platelet - w/Differential (85025-CBCD) TLB-Hepatic/Liver Function Pnl (80076-HEPATIC) TLB-TSH (Thyroid Stimulating Hormone) (84443-TSH) T-Vitamin D (25-Hydroxy) (36644-03474)  Complete Medication List: 1)  Metformin Hcl 500 Mg Tabs (Metformin hcl) .... Take 1 tab twice daily 2)  Triamcinolone Acetonide 0.1 % Oint (Triamcinolone  acetonide) .... Use two times a day prn 3)  Hydrocodone-acetaminophen 5-325 Mg Tabs (Hydrocodone-acetaminophen) .[name].. 1 by mouth up to 2 times per day as needed for pain 4)  Onetouch Ultra Test Strp (Glucose blood) .... Use asd 1 once daily 5)  Bd Eclipse Syringe 27g X 1/2" 1 Ml Misc (Syringe/needle (disp)) .... As dirr 6)  Ferrous Sulfate 325 (65 Fe) Mg Tabs (Ferrous sulfate) .[name].. 1 two times a day 7)  Aspirin 81 Mg Tbec (Aspirin) .... One by mouth every day 8)  Vitamin D3 1000 Unit Tabs (Cholecalciferol) .... 2 once daily 9)  Miralax Powd (Polyethylene glycol 3350) .[name].. 1 scoop once daily as needed for constipation 10)  Vitamin B-12 500 Mcg Tabs (Cyanocobalamin) .[name].. 1 by mouth once daily for vitamin b12 deficiency 11)  Gelnique 10 % Gel (Oxybutynin chloride) .... Use on skin once daily for bladder 12)  Lancets Misc (Lancets) .... Use asd 1 once daily 13)  Citalopram  Hydrobromide 10 Mg Tabs (Citalopram hydrobromide) .[name].. 1po once daily 14)  Losartan Potassium 100 Mg Tabs (Losartan potassium) .[name].. 1 by mouth once daily for blood pressure  Other Orders: Joint Aspirate / Injection, Large (20610) Joint Aspirate / Injection, Large (20610) Depo- Medrol 80mg  (J1040)  Patient Instructions: 1)  Please schedule a follow-up appointment in 3 months. Prescriptions: LOSARTAN POTASSIUM 100 MG TABS (LOSARTAN POTASSIUM) 1 by mouth once daily for blood pressure  #30 x 12   Entered and Authorized by:   Tresa Garter MD   Signed by:   Tresa Garter MD on 06/02/2010   Method used:   Electronically to        [facility] [phone]* (retail)       [address]       [place], [place]  [address]       Ph: [phone] or [phone]       Fax: [phone]   RxID:   [id number]

## 2010-12-12 NOTE — Progress Notes (Signed)
Summary: hydrocodone-apap  Phone Note Other Incoming Call back at fax   [phone] Summary of Call: refill request for hydrocodone apap  ok x 3 refills  will fax back to pharmacy/vg Initial call taken by: Tora Perches,  December 26, 2009 4:10 PM    Prescriptions: HYDROCODONE-ACETAMINOPHEN 5-325 MG TABS (HYDROCODONE-ACETAMINOPHEN) 1 by mouth up to 2 times per day as needed for pain  #60 x 3   Entered by:   Tora Perches   Authorized by:   Tresa Garter MD   Signed by:   Tora Perches on 12/26/2009   Method used:   Telephoned to ...       [facility] [place] [phone]* (retail)       [phone]       [place], [place]  [address]       Ph: [phone] or [phone]       Fax: [phone]   RxID:   [id number]

## 2010-12-12 NOTE — Assessment & Plan Note (Signed)
Summary: knee pain   Vital Signs:  Patient profile:   77 year old female Height:      64 inches Weight:      184 pounds BMI:     31.70 O2 Sat:      99 % on Room air Temp:     98.3 degrees F oral Pulse rate:   72 / minute BP sitting:   136 / 80  (left arm) Cuff size:   regular  Vitals Entered By: Bill Salinas CMA (May 26, 2010 10:24 AM)  O2 Flow:  Room air CC: pt here with c/o pain in both knees, but mores so in her right knee/ ab   Primary Care Provider:  Tresa Garter MD  CC:  pt here with c/o pain in both knees and but mores so in her right knee/ ab.  History of Present Illness: Patient seen acutely for knee pain with the intention of steroid injection. She has chronic OA knees and has been seen by orthopedics. TKR as been recommended for end-stage knee disease. She has chronic, constant pain and is very limited in her activities. she feels she not a surgical candidate due to heart disease. Reviewed last office note from Dr. Gala Romney - it appears she has stable CAD and could very well be a good candidate for surgery.  Current Medications (verified): 1)  Metformin Hcl 500 Mg Tabs (Metformin Hcl) .... Take 1 Tab Twice Daily 2)  Triamcinolone Acetonide 0.1 % Oint (Triamcinolone Acetonide) .... Use Two Times A Day Prn 3)  Benicar 40 Mg  Tabs (Olmesartan Medoxomil) .[name].. 1 By Mouth Once Daily 4)  Hydrocodone-Acetaminophen 5-325 Mg Tabs (Hydrocodone-Acetaminophen) .[name].. 1 By Mouth Up To 2 Times Per Day As Needed For Pain 5)  Onetouch Ultra Test  Strp (Glucose Blood) .... Use Asd 1 Once Daily 6)  Bd Eclipse Syringe 27g X 1/2" 1 Ml Misc (Syringe/needle (Disp)) .... As Dirr 7)  Ferrous Sulfate 325 (65 Fe) Mg Tabs (Ferrous Sulfate) .[name].. 1 Two Times A Day 8)  Aspirin 81 Mg  Tbec (Aspirin) .... One By Mouth Every Day 9)  Vitamin D3 1000 Unit  Tabs (Cholecalciferol) .... 2 Once Daily 10)  Miralax  Powd (Polyethylene Glycol 3350) .[name].. 1 Scoop Once Daily As Needed For Constipation 11)   Vitamin B-12 500 Mcg Tabs (Cyanocobalamin) .[name].. 1 By Mouth Once Daily For Vitamin B12 Deficiency 12)  Gelnique 10 % Gel (Oxybutynin Chloride) .... Use On Skin Once Daily For Bladder 13)  Lancets  Misc (Lancets) .... Use Asd 1 Once Daily 14)  Citalopram Hydrobromide 10 Mg Tabs (Citalopram Hydrobromide) .[name].. 1po Once Daily  Allergies: 1)  ! Penicillin V Potassium (Penicillin V Potassium) 2)  ! Adult Aspirin Low Strength (Aspirin) 3)  ! Tramadol Hcl (Tramadol Hcl) 4)  ! Iodine (Iodine) 5)  ! Maxzide-25 (Triamterene-Hctz) 6)  ! Vistaril (Hydroxyzine Pamoate) 7)  Lisinopril (Lisinopril) 8)  Norvasc (Amlodipine Besylate) 9)  Metformin Hcl (Metformin Hcl) 10)  Atenolol (Atenolol) PMH-FH-SH reviewed-no changes except otherwise noted  Review of Systems       The patient complains of dyspnea on exertion, peripheral edema, and difficulty walking.  The patient denies weight loss, weight gain, and chest pain.    Physical Exam  General:  obese eldelry AA female wo required 2+ assistance to stand and to step-up to exam table. Head:  Normocephalic and atraumatic without obvious abnormalities. No apparent alopecia or balding. Eyes:  C&S clear Neck:  supple and full ROM.  Lungs:  normal respiratory effort.   Heart:  normal rate and regular rhythm.   Abdomen:  obese Msk:  obesity including very fatty thighs and knee. Very difficult to palpate the joint line. She has a lot of tenderness to palpation at the knee. No erythema, no effusion.  Pulses:  2+ radial pusle   Impression & Recommendations:  Problem # 1:  KNEE PAIN, CHRONIC (ICD-719.46) Patient with end-stage degenerative disease of the knees. Due to obesity and lack of good landmarks and questionable efficacy inter-articular steroid injection was not attempted.  Plan - re-consult ortho in regard to TKR. If this is recommended she will need cardiac clearance by cardiology.  Her updated medication list for this problem includes:     Hydrocodone-acetaminophen 5-325 Mg Tabs (Hydrocodone-acetaminophen) .[name]... 1 by mouth up to 2 times per day as needed for pain    Aspirin 81 Mg Tbec (Aspirin) ..... One by mouth every day  Orders: Orthopedic Surgeon Referral (Ortho Surgeon)  Complete Medication List: 1)  Metformin Hcl 500 Mg Tabs (Metformin hcl) .... Take 1 tab twice daily 2)  Triamcinolone Acetonide 0.1 % Oint (Triamcinolone acetonide) .... Use two times a day prn 3)  Benicar 40 Mg Tabs (Olmesartan medoxomil) .[name].. 1 by mouth once daily 4)  Hydrocodone-acetaminophen 5-325 Mg Tabs (Hydrocodone-acetaminophen) .[name].. 1 by mouth up to 2 times per day as needed for pain 5)  Onetouch Ultra Test Strp (Glucose blood) .... Use asd 1 once daily 6)  Bd Eclipse Syringe 27g X 1/2" 1 Ml Misc (Syringe/needle (disp)) .... As dirr 7)  Ferrous Sulfate 325 (65 Fe) Mg Tabs (Ferrous sulfate) .[name].. 1 two times a day 8)  Aspirin 81 Mg Tbec (Aspirin) .... One by mouth every day 9)  Vitamin D3 1000 Unit Tabs (Cholecalciferol) .... 2 once daily 10)  Miralax Powd (Polyethylene glycol 3350) .[name].. 1 scoop once daily as needed for constipation 11)  Vitamin B-12 500 Mcg Tabs (Cyanocobalamin) .[name].. 1 by mouth once daily for vitamin b12 deficiency 12)  Gelnique 10 % Gel (Oxybutynin chloride) .... Use on skin once daily for bladder 13)  Lancets Misc (Lancets) .... Use asd 1 once daily 14)  Citalopram Hydrobromide 10 Mg Tabs (Citalopram hydrobromide) .[name].. 1po once daily

## 2010-12-12 NOTE — Assessment & Plan Note (Signed)
Summary: ER FU/ POTASSIUM IS HIGH/ BS IS LOW/NWS   Vital Signs:  Patient profile:   77 year old female Height:      64 inches Weight:      186.25 pounds BMI:     32.09 O2 Sat:      99 % on Room air Temp:     97.6 degrees F oral Pulse rate:   74 / minute BP supine:   164 / 82 BP sitting:   152 / 82  (left arm) BP standing:   152 / 90 Cuff size:   regular  Vitals Entered By: Zella Ball Ewing CMA (AAMA) (May 11, 2010 3:09 PM)  O2 Flow:  Room air CC: ER Followup/RE   CC:  ER Followup/RE.  History of Present Illness: pt of Dr Posey Rea, here to f/u recent ER evaluation where no orthostatic BP ordered by midlevel provider with pt chief c/o weakness with nausea and lightheadedness.  Pt w/u included finding of mild anemia and slight elevated K  (no low BS despite pt understanding of events);  ecg, cxr and UA and other exam noted as unrevealing;  pt reports increased depressive symptoms on questioning with decreased by mouth intake for the past several wks, without dysphagia, n/v, abd pain, change in bowels, blood or fever.  Pt c/o essentially no change from ER evaluation.  CBG 137 today, pt denies polyuria, polydipsia, or low sugar symptoms requiring eating or other treatment.  Glucometer is broken and she is given new meter today.  Pt denies CP, sob, doe, wheezing, orthopnea, pnd, worsening LE edema, palps, or syncope . Pt denies new neuro symptoms such as headache, facial or extremity weakness  No fever, recent wt loss, night sweats , suicidal ideaiton, or panic.    Problems Prior to Update: 1)  Dizziness  (ICD-780.4) 2)  Shoulder Pain  (ICD-719.41) 3)  Frequency, Urinary  (ICD-788.41) 4)  Constipation, Chronic  (ICD-564.09) 5)  Rash and Other Nonspecific Skin Eruption  (ICD-782.1) 6)  Foot Pain  (ICD-729.5) 7)  Tinnitus Nos  (ICD-388.30) 8)  Vaginitis  (ICD-616.10) 9)  B12 Deficiency  (ICD-266.2) 10)  Low Back Pain  (ICD-724.2) 11)  Dysuria  (ICD-788.1) 12)  Dyspnea  (ICD-786.05) 13)   Chest Wall Pain  (ICD-786.52) 14)  Abdominal Pain  (ICD-789.00) 15)  Vaginitis  (ICD-616.10) 16)  Dysuria  (ICD-788.1) 17)  Dyspnea  (ICD-786.05) 18)  Fatigue  (ICD-780.79) 19)  Hyperlipidemia  (ICD-272.4) 20)  Anxiety  (ICD-300.00) 21)  Chest Pain  (ICD-786.50) 22)  Gerd  (ICD-530.81) 23)  Diabetes Mellitus, Type II  (ICD-250.00) 24)  Arthralgia  (ICD-719.40) 25)  Renal Insufficiency  (ICD-588.9) 26)  Pancreatitis, Hx of  (ICD-V12.70) 27)  Osteoporosis  (ICD-733.00) 28)  Hypertension  (ICD-401.9) 29)  Gout  (ICD-274.9) 30)  Dvt, Hx of  (ICD-V12.51) 31)  Depression  (ICD-311) 32)  Coronary Artery Disease  (ICD-414.00) 33)  Anemia-iron Deficiency  (ICD-280.9) 34)  Other Spec Forms Chronic Ischemic Heart Disease  (ICD-414.8)  Medications Prior to Update: 1)  Metformin Hcl 500 Mg Tabs (Metformin Hcl) .... Take 1 Tab Twice Daily 2)  Triamcinolone Acetonide 0.1 % Oint (Triamcinolone Acetonide) .... Use Two Times A Day Prn 3)  Benicar 40 Mg  Tabs (Olmesartan Medoxomil) .[name].. 1 By Mouth Once Daily 4)  Hydrocodone-Acetaminophen 5-325 Mg Tabs (Hydrocodone-Acetaminophen) .[name].. 1 By Mouth Up To 2 Times Per Day As Needed For Pain 5)  Accu-Chek Aviva  Strp (Glucose Blood) .[name].. 1 Once Daily Prn 6)  Bd  Eclipse Syringe 27g X 1/2" 1 Ml Misc (Syringe/needle (Disp)) .... As Dirr 7)  Ferrous Sulfate 325 (65 Fe) Mg Tabs (Ferrous Sulfate) .[name].. 1 Two Times A Day 8)  Aspirin 81 Mg  Tbec (Aspirin) .... One By Mouth Every Day 9)  Vitamin D3 1000 Unit  Tabs (Cholecalciferol) .... 2 Once Daily 10)  Miralax  Powd (Polyethylene Glycol 3350) .[name].. 1 Scoop Once Daily As Needed For Constipation 11)  Vitamin B-12 500 Mcg Tabs (Cyanocobalamin) .[name].. 1 By Mouth Once Daily For Vitamin B12 Deficiency 12)  Gelnique 10 % Gel (Oxybutynin Chloride) .... Use On Skin Once Daily For Bladder  Current Medications (verified): 1)  Metformin Hcl 500 Mg Tabs (Metformin Hcl) .... Take 1 Tab Twice Daily 2)  Triamcinolone Acetonide  0.1 % Oint (Triamcinolone Acetonide) .... Use Two Times A Day Prn 3)  Benicar 40 Mg  Tabs (Olmesartan Medoxomil) .[name].. 1 By Mouth Once Daily 4)  Hydrocodone-Acetaminophen 5-325 Mg Tabs (Hydrocodone-Acetaminophen) .[name].. 1 By Mouth Up To 2 Times Per Day As Needed For Pain 5)  Onetouch Ultra Test  Strp (Glucose Blood) .... Use Asd 1 Once Daily 6)  Bd Eclipse Syringe 27g X 1/2" 1 Ml Misc (Syringe/needle (Disp)) .... As Dirr 7)  Ferrous Sulfate 325 (65 Fe) Mg Tabs (Ferrous Sulfate) .[name].. 1 Two Times A Day 8)  Aspirin 81 Mg  Tbec (Aspirin) .... One By Mouth Every Day 9)  Vitamin D3 1000 Unit  Tabs (Cholecalciferol) .... 2 Once Daily 10)  Miralax  Powd (Polyethylene Glycol 3350) .[name].. 1 Scoop Once Daily As Needed For Constipation 11)  Vitamin B-12 500 Mcg Tabs (Cyanocobalamin) .[name].. 1 By Mouth Once Daily For Vitamin B12 Deficiency 12)  Gelnique 10 % Gel (Oxybutynin Chloride) .... Use On Skin Once Daily For Bladder 13)  Lancets  Misc (Lancets) .... Use Asd 1 Once Daily 14)  Citalopram Hydrobromide 10 Mg Tabs (Citalopram Hydrobromide) .[name].. 1po Once Daily  Allergies (verified): 1)  ! Penicillin V Potassium (Penicillin V Potassium) 2)  ! Adult Aspirin Low Strength (Aspirin) 3)  ! Tramadol Hcl (Tramadol Hcl) 4)  ! Iodine (Iodine) 5)  ! Maxzide-25 (Triamterene-Hctz) 6)  ! Vistaril (Hydroxyzine Pamoate) 7)  Lisinopril (Lisinopril) 8)  Norvasc (Amlodipine Besylate) 9)  Metformin Hcl (Metformin Hcl) 10)  Atenolol (Atenolol)  Past History:  Past Medical History: Last updated: 04/25/2010 1) CAD     -s/p stenting of LAD 1999     --cath 5/08. EF normal. LAD 30-40% restenosis. D1 50% D2 80% LCX & RCA minimal plaque 2) HTN 3) Hyperlipidemia 4) Anemia-iron deficiency 5) Depression 6) DVT, hx of 7) Gout 8) Osteoporosis 9) Pancreatitis, hx of 10) GERD 11) Renal insufficiency, Cr 1.2-1.3 12) Obesity 13) Diabetes mellitus 14) Polyarthritis/DJDPMR 15) Vit D def. 268.9 16) B12 def. 266.2 17)  Tinnitus 18) Anxiety Aneurysm-Thoracic Aortic Osteoporosis Constipation Urinary frequency  Past Surgical History: Last updated: 12/07/2007 Hemorrhoidectomy Hysterectomy s/p Taxus stent Cholecystectomy Tubal ligation  Social History: Last updated: 12/30/2008 She lives in [place], [place], with her   husband.  She is retired.  No tobacco or alcohol use.   Risk Factors: Smoking Status: never (01/17/2010)  Review of Systems       all otherwise negative per pt -    Physical Exam  General:  alert and overweight-appearing. fatigued appearing Head:  normocephalic and atraumatic.   Eyes:  vision grossly intact, pupils equal, and pupils round.   Ears:  R ear normal and L ear normal.   Nose:  no external deformity and no nasal discharge.   Mouth:  no gingival abnormalities and pharynx pink and moist.   Neck:  supple and no masses.   Lungs:  normal respiratory effort and normal breath sounds.   Heart:  normal rate and regular rhythm.   Abdomen:  soft, non-tender, and normal bowel sounds.   Msk:  no joint tenderness and no joint swelling.   Extremities:  no edema, no erythema  Neurologic:  strength normal in all extremities and gait normal.   Skin:  color normal and no rashes.   Psych:  dysphoric affect and slightly anxious.     Impression & Recommendations:  Problem # 1:  DIZZINESS (ICD-780.4) with weakness - mild orthostatic on exam today, I suspect due to decreased by mouth intake due to mild depressive symtpoms - encouraged increase by mouth fluids, o/w no med changes  Problem # 2:  DEPRESSION (ICD-311)  has resisted SSRI tx in the past, now willing - treat as below, f/u any worsening signs or symptoms ; declines counseling or psychiatric referral at this time  Her updated medication list for this problem includes:    Citalopram Hydrobromide 10 Mg Tabs (Citalopram hydrobromide) .[name]... 1po once daily  Problem # 3:  HYPERTENSION (ICD-401.9)  Her updated  medication list for this problem includes:    Benicar 40 Mg Tabs (Olmesartan medoxomil) .[name]... 1 by mouth once daily  BP today: 140/72 Prior BP: 144/80 (04/25/2010)  Labs Reviewed: K+: 5.9 (01/05/2010) Creat: : 1.2 (01/05/2010)   Chol: 223 (01/05/2010)   HDL: 78.60 (01/05/2010)   LDL: DEL (01/04/2009)   TG: 118.0 (01/05/2010) stable overall by hx and exam, ok to continue meds/tx as is   Problem # 4:  DIABETES MELLITUS, TYPE II (ICD-250.00)  Her updated medication list for this problem includes:    Metformin Hcl 500 Mg Tabs (Metformin hcl) .[name]... Take 1 tab twice daily    Benicar 40 Mg Tabs (Olmesartan medoxomil) .[name]... 1 by mouth once daily    Aspirin 81 Mg Tbec (Aspirin) ..... One by mouth every day  Orders: Glucose, (CBG) (84696) Fingerstick [phone])  Labs Reviewed: Creat: 1.2 (01/05/2010)    Reviewed HgBA1c results: 6.1 (01/05/2010)  6.1 (05/10/2009) stable overall by hx and exam, ok to continue meds/tx as is   Complete Medication List: 1)  Metformin Hcl 500 Mg Tabs (Metformin hcl) .... Take 1 tab twice daily 2)  Triamcinolone Acetonide 0.1 % Oint (Triamcinolone acetonide) .... Use two times a day prn 3)  Benicar 40 Mg Tabs (Olmesartan medoxomil) .[name].. 1 by mouth once daily 4)  Hydrocodone-acetaminophen 5-325 Mg Tabs (Hydrocodone-acetaminophen) .[name].. 1 by mouth up to 2 times per day as needed for pain 5)  Onetouch Ultra Test Strp (Glucose blood) .... Use asd 1 once daily 6)  Bd Eclipse Syringe 27g X 1/2" 1 Ml Misc (Syringe/needle (disp)) .... As dirr 7)  Ferrous Sulfate 325 (65 Fe) Mg Tabs (Ferrous sulfate) .[name].. 1 two times a day 8)  Aspirin 81 Mg Tbec (Aspirin) .... One by mouth every day 9)  Vitamin D3 1000 Unit Tabs (Cholecalciferol) .... 2 once daily 10)  Miralax Powd (Polyethylene glycol 3350) .[name].. 1 scoop once daily as needed for constipation 11)  Vitamin B-12 500 Mcg Tabs (Cyanocobalamin) .[name].. 1 by mouth once daily for vitamin b12 deficiency 12)  Gelnique 10 % Gel  (Oxybutynin chloride) .... Use on skin once daily for bladder 13)  Lancets Misc (Lancets) .... Use asd 1 once daily 14)  Citalopram  Hydrobromide 10 Mg Tabs (Citalopram hydrobromide) .[name].. 1po once daily  Patient Instructions: 1)  Please take all new medications as prescribed 2)  please drink more fluids in the next few weeks 3)  Continue all previous medications as before this visit 4)  Please schedule an appointment with your primary doctor in :3 wks (you can cancel the aug 18 visit with Dr Posey Rea) Prescriptions: CITALOPRAM HYDROBROMIDE 10 MG TABS (CITALOPRAM HYDROBROMIDE) 1po once daily  #90 x 3   Entered and Authorized by:   Corwin Levins MD   Signed by:   Corwin Levins MD on 05/11/2010   Method used:   Print then Give to Patient   RxID:   [id number] LANCETS  MISC (LANCETS) use asd 1 once daily  #100 x 11   Entered and Authorized by:   Corwin Levins MD   Signed by:   Corwin Levins MD on 05/11/2010   Method used:   Print then Give to Patient   RxID:   [id number] [facility] ULTRA TEST  STRP (GLUCOSE BLOOD) use asd 1 once daily  #100 x 3   Entered and Authorized by:   Corwin Levins MD   Signed by:   Corwin Levins MD on 05/11/2010   Method used:   Print then Give to Patient   RxID:   [id number]

## 2010-12-12 NOTE — Assessment & Plan Note (Signed)
Summary: f/u labs per pt/#/cd   Vital Signs:  Patient profile:   77 year old female Weight:      191 pounds Temp:     98.9 degrees F oral Pulse rate:   78 / minute BP sitting:   152 / 72  (left arm)  Vitals Entered By: Tora Perches (January 17, 2010 2:00 PM) CC: f/u Is Patient Diabetic? Yes   CC:  f/u.  History of Present Illness: The patient presents for a follow up of back pain, anxiety, depression and headaches. C/o constipation x years.   Preventive Screening-Counseling & Management  Alcohol-Tobacco     Smoking Status: never  Current Medications (verified): 1)  Metformin Hcl 500 Mg Tabs (Metformin Hcl) .... Take 1 Tab Twice Daily 2)  Triamcinolone Acetonide 0.1 % Oint (Triamcinolone Acetonide) .... Use Two Times A Day Prn 3)  Benicar 40 Mg  Tabs (Olmesartan Medoxomil) .[name].. 1 By Mouth Once Daily 4)  Hydrocodone-Acetaminophen 5-325 Mg Tabs (Hydrocodone-Acetaminophen) .[name].. 1 By Mouth Up To 2 Times Per Day As Needed For Pain 5)  Accu-Chek Aviva  Strp (Glucose Blood) .[name].. 1 Once Daily Prn 6)  Cobal-1000 1000 Mcg/ml Soln (Cyanocobalamin) .[name].. 1 Ml Subcutaneously Q 2 Wks 7)  Bd Eclipse Syringe 27g X 1/2" 1 Ml Misc (Syringe/needle (Disp)) .... As Dirr 8)  Ferrous Sulfate 325 (65 Fe) Mg Tabs (Ferrous Sulfate) .[name].. 1 Two Times A Day 9)  Aspirin 81 Mg  Tbec (Aspirin) .... One By Mouth Every Day 10)  Vitamin D3 1000 Unit  Tabs (Cholecalciferol) .... 2 Once Daily  Allergies: 1)  ! Penicillin V Potassium (Penicillin V Potassium) 2)  ! Adult Aspirin Low Strength (Aspirin) 3)  ! Tramadol Hcl (Tramadol Hcl) 4)  ! Iodine (Iodine) 5)  ! Maxzide-25 (Triamterene-Hctz) 6)  ! Vistaril (Hydroxyzine Pamoate) 7)  Lisinopril (Lisinopril) 8)  Norvasc (Amlodipine Besylate) 9)  Metformin Hcl (Metformin Hcl) 10)  Atenolol (Atenolol)  Past History:  Social History: Last updated: 12/30/2008 She lives in [place], [place], with her   husband.  She is retired.  No tobacco or alcohol  use.   Past Medical History: 1) CAD     -s/p stenting of LAD 1999     --cath 5/08. EF normal. LAD 30-40% restenosis. D1 50% D2 80% LCX & RCA minimal plaque 2) HTN 3) Hyperlipidemia 4) Anemia-iron deficiency 5) Depression 6) DVT, hx of 7) Gout 8) Osteoporosis 9) Pancreatitis, hx of 10) GERD 11) Renal insufficiency, Cr 1.2-1.3 12) Obesity 13) Diabetes mellitus 14) Polyarthritis/DJDPMR 15) Vit D def. 268.9 16) B12 def. 266.2 17) Tinnitus 18) Anxiety Aneurysm-Thoracic Aortic Osteoporosis Constipation  Review of Systems  The patient denies fever, chest pain, abdominal pain, and melena.    Physical Exam  General:  NAD, slow overweight-appearing.   Nose:  External nasal examination shows no deformity or inflammation. Nasal mucosa are pink and moist without lesions or exudates. Mouth:  Oral mucosa and oropharynx without lesions or exudates.  Teeth in good repair. Lungs:  Normal respiratory effort, chest expands symmetrically. Lungs are clear to auscultation, no crackles or wheezes. Heart:  1-2/6 murmur Abdomen:  Bowel sounds positive,abdomen soft and non-tender without masses, organomegaly or hernias noted. Msk:  L hammer toe Lumbar-sacral spine is tender to palpation over paraspinal muscles and painfull with the ROM  Very slow Extremities:  No clubbing, cyanosis, edema, or deformity noted with normal full range of motion of all joints.   Neurologic:  No cranial nerve deficits noted. Station and  gait are ataxic. Plantar reflexes are down-going bilaterally. DTRs are symmetrical throughout. Sensory, motor and coordinative functions appear intact. Skin:  B feet ok Psych:  Oriented X3 and subdued.     Impression & Recommendations:  Problem # 1:  B12 DEFICIENCY (ICD-266.2) Assessment Improved On prescription therapy   Problem # 2:  FATIGUE (ICD-780.79) Assessment: Unchanged Take a power nap Treat constipation  Problem # 3:  CONSTIPATION, CHRONIC  (ICD-564.09) Assessment: New  Her updated medication list for this problem includes:    Miralax Powd (Polyethylene glycol 3350) .[name]... 1 scoop once daily as needed for constipation  Problem # 4:  HYPERLIPIDEMIA (ICD-272.4) Assessment: Unchanged The labs were reviewed with the patient.   Problem # 5:  DIABETES MELLITUS, TYPE II (ICD-250.00) Assessment: Improved  Her updated medication list for this problem includes:    Metformin Hcl 500 Mg Tabs (Metformin hcl) .[name]... Take 1 tab twice daily    Benicar 40 Mg Tabs (Olmesartan medoxomil) .[name]... 1 by mouth once daily    Aspirin 81 Mg Tbec (Aspirin) ..... One by mouth every day  Problem # 6:  HYPERTENSION (ICD-401.9) Assessment: Comment Only  Her updated medication list for this problem includes:    Benicar 40 Mg Tabs (Olmesartan medoxomil) .[name]... 1 by mouth once daily  Complete Medication List: 1)  Metformin Hcl 500 Mg Tabs (Metformin hcl) .... Take 1 tab twice daily 2)  Triamcinolone Acetonide 0.1 % Oint (Triamcinolone acetonide) .... Use two times a day prn 3)  Benicar 40 Mg Tabs (Olmesartan medoxomil) .[name].. 1 by mouth once daily 4)  Hydrocodone-acetaminophen 5-325 Mg Tabs (Hydrocodone-acetaminophen) .[name].. 1 by mouth up to 2 times per day as needed for pain 5)  Accu-chek Aviva Strp (Glucose blood) .[name].. 1 once daily prn 6)  Cobal-1000 1000 Mcg/ml Soln (Cyanocobalamin) .[name].. 1 ml subcutaneously q 2 wks 7)  Bd Eclipse Syringe 27g X 1/2" 1 Ml Misc (Syringe/needle (disp)) .... As dirr 8)  Ferrous Sulfate 325 (65 Fe) Mg Tabs (Ferrous sulfate) .[name].. 1 two times a day 9)  Aspirin 81 Mg Tbec (Aspirin) .... One by mouth every day 10)  Vitamin D3 1000 Unit Tabs (Cholecalciferol) .... 2 once daily 11)  Miralax Powd (Polyethylene glycol 3350) .[name].. 1 scoop once daily as needed for constipation  Other Orders: Pneumococcal Vaccine (62130) Admin 1st Vaccine (86578) Admin 1st Vaccine [facility]) [phone])  Patient Instructions: 1)  Please schedule a  follow-up appointment in 3 months. 2)  BMP prior to visit, ICD-9: 3)  Hepatic Panel prior to visit, ICD-9:995.20 4)  TSH prior to visit, ICD-9 5)  CBC 780.79: Prescriptions: MIRALAX  POWD (POLYETHYLENE GLYCOL 3350) 1 scoop once daily as needed for constipation  #1 x 12   Entered and Authorized by:   Tresa Garter MD   Signed by:   Tresa Garter MD on 01/17/2010   Method used:   Print then Give to Patient   RxID:   [id number]    Pneumovax Vaccine    Vaccine Type: Pneumovax    Site: left deltoid    Mfr: Merck    Dose: 0.5 ml    Route: IM    Given by: Tora Perches    Exp. Date: 06/26/2011    Lot #: [id number]    VIS given: 06/09/96 version given January 17, 2010.   Orders Added: 1)  Est. Patient Level IV [36644] 2)  Pneumococcal Vaccine [90732] 3)  Admin 1st Vaccine [90471] 4)  Admin 1st Vaccine [facility]) [phone]

## 2010-12-12 NOTE — Miscellaneous (Signed)
Summary: [facility]  [facility]   Imported By: Sherian Rein 04/26/2010 11:49:08  _____________________________________________________________________  External Attachment:    Type:   Image     Comment:   External Document

## 2010-12-14 NOTE — Progress Notes (Signed)
Summary: Rf Hydroco/Acetamin  Phone Note Refill Request Message from:  Fax from Pharmacy  Refills Requested: Medication #1:  HYDROCODONE-ACETAMINOPHEN 5-325 MG TABS 1 by mouth up to 2 times per day as needed for pain   Dosage confirmed as above?Dosage Confirmed   Supply Requested: 60   Last Refilled: 09/26/2010  Method Requested: Telephone to Pharmacy Initial call taken by: Lanier Prude, [facility]),  November 07, 2010 4:21 PM  Follow-up for Phone Call        ok to ref Follow-up by: Tresa Garter MD,  November 07, 2010 5:11 PM  Additional Follow-up for Phone Call Additional follow up Details #1::        Rx called to pharmacy Additional Follow-up by: Lanier Prude, [facility]),  November 08, 2010 12:02 PM    Prescriptions: HYDROCODONE-ACETAMINOPHEN 5-325 MG TABS (HYDROCODONE-ACETAMINOPHEN) 1 by mouth up to 2 times per day as needed for pain  #60 x 0   Entered by:   Lanier Prude, [facility])   Authorized by:   Tresa Garter MD   Signed by:   Lanier Prude, CMA(AAMA) on 11/08/2010   Method used:   Telephoned to ...       [facility]  [address] [place]. [phone]* (retail)       [phone]       [place], [place]  [address]       Ph: [phone] or [phone]       Fax: [phone]   RxID:   [id number]

## 2010-12-28 NOTE — Medication Information (Signed)
Summary: [facility]  [facility]   Imported By: Sherian Rein 12/20/2010 12:22:13  _____________________________________________________________________  External Attachment:    Type:   Image     Comment:   External Document

## 2011-01-03 ENCOUNTER — Other Ambulatory Visit: Payer: Self-pay

## 2011-01-10 ENCOUNTER — Ambulatory Visit (INDEPENDENT_AMBULATORY_CARE_PROVIDER_SITE_OTHER): Payer: No Typology Code available for payment source | Admitting: Internal Medicine

## 2011-01-10 ENCOUNTER — Encounter: Payer: Self-pay | Admitting: Internal Medicine

## 2011-01-10 ENCOUNTER — Other Ambulatory Visit: Payer: Self-pay | Admitting: Internal Medicine

## 2011-01-10 ENCOUNTER — Other Ambulatory Visit: Payer: No Typology Code available for payment source

## 2011-01-10 DIAGNOSIS — E119 Type 2 diabetes mellitus without complications: Secondary | ICD-10-CM

## 2011-01-10 DIAGNOSIS — Z0000 Encounter for general adult medical examination without abnormal findings: Secondary | ICD-10-CM

## 2011-01-10 DIAGNOSIS — E538 Deficiency of other specified B group vitamins: Secondary | ICD-10-CM

## 2011-01-10 DIAGNOSIS — I10 Essential (primary) hypertension: Secondary | ICD-10-CM

## 2011-01-10 DIAGNOSIS — E785 Hyperlipidemia, unspecified: Secondary | ICD-10-CM

## 2011-01-10 LAB — TSH: TSH: 1.36 u[IU]/mL (ref 0.35–5.50)

## 2011-01-10 LAB — B12 AND FOLATE PANEL
Folate: 9.5 ng/mL (ref >5.9)
Vitamin B-12: 600 pg/mL (ref 211–911)

## 2011-01-10 LAB — CBC WITH DIFFERENTIAL/PLATELET
Basophils Relative: 0.1 % (ref 0.0–3.0)
Eosinophils Relative: 2.3 % (ref 0.0–5.0)
HCT: 29.5 % — ABNORMAL LOW (ref 36.0–46.0)
Lymphs Abs: 1 10*3/uL (ref 0.7–4.0)
MCV: 88.1 fl (ref 78.0–100.0)
Monocytes Absolute: 0.3 10*3/uL (ref 0.1–1.0)
Platelets: 176 10*3/uL (ref 150.0–400.0)
RBC: 3.34 Mil/uL — ABNORMAL LOW (ref 3.87–5.11)
WBC: 5.3 10*3/uL (ref 4.5–10.5)

## 2011-01-10 LAB — URINALYSIS
Leukocytes, UA: NEGATIVE
Specific Gravity, Urine: 1.02 (ref 1.000–1.030)
Urine Glucose: NEGATIVE
Urobilinogen, UA: 0.2 (ref 0.0–1.0)
pH: 5.5 (ref 5.0–8.0)

## 2011-01-10 LAB — LIPID PANEL
Cholesterol: 214 mg/dL — ABNORMAL HIGH (ref 0–200)
Total CHOL/HDL Ratio: 3

## 2011-01-10 LAB — HEPATIC FUNCTION PANEL
ALT: 13 U/L (ref 0–35)
Total Protein: 8.3 g/dL (ref 6.0–8.3)

## 2011-01-10 LAB — BASIC METABOLIC PANEL
BUN: 32 mg/dL — ABNORMAL HIGH (ref 6–23)
Chloride: 111 mEq/L (ref 96–112)
Potassium: 5.2 mEq/L — ABNORMAL HIGH (ref 3.5–5.1)

## 2011-01-18 ENCOUNTER — Encounter: Payer: Self-pay | Admitting: Internal Medicine

## 2011-01-23 NOTE — Assessment & Plan Note (Signed)
Summary: MEDICARE WELLNESS/NWS#   Vital Signs:  Patient profile:   77 year old female Height:      64 inches Weight:      181 pounds BMI:     31.18 Temp:     98.7 degrees F oral Pulse rate:   72 / minute Pulse rhythm:   regular Resp:     16 per minute BP sitting:   160 / 80  (right arm) Cuff size:   regular  Vitals Entered By: Lanier Prude, Beverly Gust) (January 10, 2011 8:47 AM) CC: MWV Is Patient Diabetic? Yes CBG Result 59 Comments pt request new glucose meter. I gave her a Accucheck Aviva   Primary Care Provider:  Tresa Garter MD  CC:  MWV.  History of Present Illness: The patient presents for a preventive health examination  Patient past medical history, social history, and family history reviewed in detail no significant changes.  Patient is physically active. Depression is negative and mood is fair. Hearing is normal, and able to perform activities of daily living. Risk of falling is moderate and home safety has been reviewed and is appropriate. Patient has normal height, she is overweight, and visual acuity is is ok w/glasses. Patient has been counseled on age-appropriate routine health concerns for screening and prevention. Education, counseling done. Cognition is nl. F/u HTN - c/o med is too $$$ F/u anxiety. C/o OA. C/o HA due to Hydrocod. F/u DM  Preventive Screening-Counseling & Management  Alcohol-Tobacco     Alcohol drinks/day: 0     Smoking Status: never  Caffeine-Diet-Exercise     Caffeine use/day: 0     Diet Counseling: not indicated; diet is assessed to be healthy     Does Patient Exercise: no     Depression Counseling: not indicated; screening negative for depression  Hep-HIV-STD-Contraception     Hepatitis Risk: no risk noted     Dental Visit-last 6 months no     SBE monthly: yes     Sun Exposure-Excessive: no  Safety-Violence-Falls     Seat Belt Use: yes     Helmet Use: n/a     Firearms in the Home: firearms in the home     Smoke  Detectors: yes     Violence in the Home: no risk noted     Sexual Abuse: no     Fall Risk: none  Current Medications (verified): 1)  Metformin Hcl 500 Mg Tabs (Metformin Hcl) .... Take 1 Tab Twice Daily 2)  Triamcinolone Acetonide 0.1 % Oint (Triamcinolone Acetonide) .... Use Two Times A Day Prn 3)  Hydrocodone-Acetaminophen 5-325 Mg Tabs (Hydrocodone-Acetaminophen) .[name].. 1 By Mouth Up To 2 Times Per Day As Needed For Pain 4)  Onetouch Ultra Test  Strp (Glucose Blood) .... Use Asd 1 Once Daily 5)  Bd Eclipse Syringe 27g X 1/2" 1 Ml Misc (Syringe/needle (Disp)) .... As Dirr 6)  Ferrous Sulfate 325 (65 Fe) Mg Tabs (Ferrous Sulfate) .[name].. 1 Two Times A Day 7)  Aspirin 81 Mg  Tbec (Aspirin) .... One By Mouth Every Day 8)  Vitamin D3 1000 Unit  Tabs (Cholecalciferol) .... 2 Once Daily 9)  Miralax  Powd (Polyethylene Glycol 3350) .[name].. 1 Scoop Once Daily As Needed For Constipation 10)  Vitamin B-12 500 Mcg Tabs (Cyanocobalamin) .[name].. 1 By Mouth Once Daily For Vitamin B12 Deficiency 11)  Gelnique 10 % Gel (Oxybutynin Chloride) .... Use On Skin Once Daily For Bladder 12)  Lancets  Misc (Lancets) .... Use Asd 1 Once  Daily 13)  Citalopram Hydrobromide 10 Mg Tabs (Citalopram Hydrobromide) .[name].. 1po Once Daily 14)  Losartan Potassium 100 Mg Tabs (Losartan Potassium) .[name].. 1 By Mouth Once Daily For Blood Pressure  Allergies (verified): 1)  ! Penicillin V Potassium (Penicillin V Potassium) 2)  ! Adult Aspirin Low Strength (Aspirin) 3)  ! Iodine (Iodine) 4)  ! Maxzide-25 (Triamterene-Hctz) 5)  ! Vistaril (Hydroxyzine Pamoate) 6)  Tramadol Hcl (Tramadol Hcl) 7)  Lisinopril (Lisinopril) 8)  Norvasc (Amlodipine Besylate) 9)  Metformin Hcl (Metformin Hcl) 10)  Hydrocodone 11)  Atenolol (Atenolol)  Past History:  Past Medical History: Last updated: 04/25/2010 1) CAD     -s/p stenting of LAD 1999     --cath 5/08. EF normal. LAD 30-40% restenosis. D1 50% D2 80% LCX & RCA minimal plaque 2) HTN 3)  Hyperlipidemia 4) Anemia-iron deficiency 5) Depression 6) DVT, hx of 7) Gout 8) Osteoporosis 9) Pancreatitis, hx of 10) GERD 11) Renal insufficiency, Cr 1.2-1.3 12) Obesity 13) Diabetes mellitus 14) Polyarthritis/DJDPMR 15) Vit D def. 268.9 16) B12 def. 266.2 17) Tinnitus 18) Anxiety Aneurysm-Thoracic Aortic Osteoporosis Constipation Urinary frequency  Past Surgical History: Last updated: 12/07/2007 Hemorrhoidectomy Hysterectomy s/p Taxus stent Cholecystectomy Tubal ligation  Family History: Last updated: 12/30/2008  Patient was adopted and therefore uncertain about   biological family history.   Social History: Last updated: 12/30/2008 She lives in [place], [place], with her   husband.  She is retired.  No tobacco or alcohol use.   Social History: Caffeine use/day:  0 Does Patient Exercise:  no Dental Care w/in 6 mos.:  no Sun Exposure-Excessive:  no Seat Belt Use:  yes Fall Risk:  none Hepatitis Risk:  no risk noted  Review of Systems       The patient complains of weight gain, difficulty walking, and depression.  The patient denies anorexia, fever, weight loss, vision loss, decreased hearing, hoarseness, chest pain, syncope, dyspnea on exertion, peripheral edema, prolonged cough, headaches, hemoptysis, abdominal pain, melena, hematochezia, severe indigestion/heartburn, hematuria, incontinence, genital sores, muscle weakness, suspicious skin lesions, transient blindness, unusual weight change, abnormal bleeding, enlarged lymph nodes, angioedema, and breast masses.    Physical Exam  General:  obese eldelry AA female wo required 2+ assistance to stand and to step-up to exam table. Head:  Normocephalic and atraumatic without obvious abnormalities. No apparent alopecia or balding. Eyes:  C&S clear Ears:  R ear normal and L ear normal.   Nose:  no external deformity and no nasal discharge.   Mouth:  no gingival abnormalities and pharynx pink and  moist.   Neck:  supple and full ROM.   Lungs:  normal respiratory effort.   Heart:  normal rate and regular rhythm.   Abdomen:  obese Msk:  obesity including very fatty thighs and knee. Very difficult to palpate the joint line. She has a lot of tenderness to palpation at the knee B. No erythema, no effusion. B bursae ancerina are tender Neurologic:  strength almost  normal in all extremities and gait ataxic.   Skin:  color normal and no rashes.  I trimmed her toenails Psych:  dysphoric affect and slightly anxious.     Impression & Recommendations:  Problem # 1:  HEALTH MAINTENANCE EXAM (ICD-V70.0) Assessment New  I have personally reviewed the Medicare Annual Wellness questionnaire and have noted 1.   The patient's medical and social history 2.   Their use of alcohol, tobacco or illicit drugs 3.   Their current medications and supplements 4.  The patient's functional ability including ADL's, fall risks, home safety risks and hearing or visual             impairment. 5.   Diet and physical activities 6.   Evidence for depression or mood disorders The patients weight, height, BMI and visual acuity have been recorded in the chart I have made referrals, counseling and provided education to the patient based review of the above and I have provided the pt with a written personalized care plan for preventive services.  Orders: TLB-BMP (Basic Metabolic Panel-BMET) (80048-METABOL) TLB-CBC Platelet - w/Differential (85025-CBCD) TLB-Hepatic/Liver Function Pnl (80076-HEPATIC) TLB-Lipid Panel (80061-LIPID) TLB-TSH (Thyroid Stimulating Hormone) (84443-TSH) TLB-Udip ONLY (81003-UDIP) Medicare -1st Annual Wellness Visit [id number])  Problem # 2:  B12 DEFICIENCY (ICD-266.2) Assessment: Unchanged  On the regimen of medicine(s) reflected in the chart    Orders: TLB-B12 + Folate Pnl (82746_82607-B12/FOL)  Problem # 3:  LOW BACK PAIN (ICD-724.2) Assessment: Deteriorated  The following  medications were removed from the medication list:    Hydrocodone-acetaminophen 5-325 Mg Tabs (Hydrocodone-acetaminophen) .[name]... 1 by mouth up to 2 times per day as needed for pain Her updated medication list for this problem includes:    Aminofen 500 Mg Tabs (Acetaminophen) .[name]... 1 by mouth three times a day as needed pain    Aspirin 81 Mg Tbec (Aspirin) ..... One by mouth every day  Problem # 4:  DIABETES MELLITUS, TYPE II (ICD-250.00) Assessment: Unchanged  The following medications were removed from the medication list:    Losartan Potassium 100 Mg Tabs (Losartan potassium) .[name]... 1 by mouth once daily for blood pressure Her updated medication list for this problem includes:    Benazepril Hcl 40 Mg Tabs (Benazepril hcl) .[name]... 1 by mouth once daily for blood pressure    Metformin Hcl 500 Mg Tabs (Metformin hcl) .[name]... Take 1 tab twice daily    Aspirin 81 Mg Tbec (Aspirin) ..... One by mouth every day  Orders: Capillary Blood Glucose/CBG (95638) TLB-B12 + Folate Pnl (75643_32951-O84/ZYS)  Problem # 5:  HYPERTENSION (ICD-401.9) Assessment: Comment Only  The following medications were removed from the medication list:    Losartan Potassium 100 Mg Tabs (Losartan potassium) .[name]... 1 by mouth once daily for blood pressure -- too $$$$ Her updated medication list for this problem includes:    Benazepril Hcl 40 Mg Tabs (Benazepril hcl) .[name]... 1 by mouth once daily for blood pressure  Orders: TLB-B12 + Folate Pnl (06301_60109-N23/FTD)  Complete Medication List: 1)  Benazepril Hcl 40 Mg Tabs (Benazepril hcl) .[name].. 1 by mouth once daily for blood pressure 2)  Metformin Hcl 500 Mg Tabs (Metformin hcl) .... Take 1 tab twice daily 3)  Ferrous Sulfate 325 (65 Fe) Mg Tabs (Ferrous sulfate) .[name].. 1 two times a day 4)  Citalopram Hydrobromide 10 Mg Tabs (Citalopram hydrobromide) .[name].. 1po once daily 5)  Vitamin D3 1000 Unit Tabs (Cholecalciferol) .... 2 once daily 6)  Vitamin B-12 500 Mcg Tabs  (Cyanocobalamin) .[name].. 1 by mouth once daily for vitamin b12 deficiency 7)  Aminofen 500 Mg Tabs (Acetaminophen) .[name].. 1 by mouth three times a day as needed pain 8)  Triamcinolone Acetonide 0.1 % Oint (Triamcinolone acetonide) .... Use two times a day prn 9)  Bd Eclipse Syringe 27g X 1/2" 1 Ml Misc (Syringe/needle (disp)) .... As dirr 10)  Onetouch Ultra Test Strp (Glucose blood) .... Use asd 1 once daily 11)  Miralax Powd (Polyethylene glycol 3350) .[name].. 1 scoop once daily as needed for constipation 12)  Aspirin 81 Mg Tbec (Aspirin) .... One by mouth every day 13)  Lancets Misc (Lancets) .... Use asd 1 once daily 14)  Accu-chek Aviva Plus Strp (Glucose blood) .... Use 1 once daily  Other Orders: T-Vitamin D (25-Hydroxy) (16109-60454)  Patient Instructions: 1)  Please schedule a follow-up appointment in 4 months. 2)  BMP prior to visit, ICD-9: 3)  HbgA1C prior to visit, ICD-9:250.00 Prescriptions: ACCU-CHEK AVIVA PLUS  STRP (GLUCOSE BLOOD) use 1 once daily  #100 x 5   Entered by:   Lanier Prude, CMA(AAMA)   Authorized by:   Tresa Garter MD   Signed by:   Lanier Prude, [facility]) on 01/10/2011   Method used:   Electronically to        [facility]. [phone]* (retail)       [address], [place]  [address]       Ph: [phone] or [phone]       Fax: [phone]   RxID:   [id number] BENAZEPRIL HCL 40 MG TABS (BENAZEPRIL HCL) 1 by mouth once daily for blood pressure  #30 x 11   Entered and Authorized by:   Tresa Garter MD   Signed by:   Tresa Garter MD on 01/10/2011   Method used:   Print then Give to Patient   RxID:   [id number] AMINOFEN 500 MG TABS (ACETAMINOPHEN) 1 by mouth three times a day as needed pain  #100 x 3   Entered and Authorized by:   Tresa Garter MD   Signed by:   Tresa Garter MD on 01/10/2011   Method used:   Print then Give to Patient   RxID:   [id number]    Orders Added: 1)  Capillary  Blood Glucose/CBG [82948] 2)  T-Vitamin D (25-Hydroxy) [93235-57322] 3)  TLB-BMP (Basic Metabolic Panel-BMET) [80048-METABOL] 4)  TLB-CBC Platelet - w/Differential [85025-CBCD] 5)  TLB-Hepatic/Liver Function Pnl [80076-HEPATIC] 6)  TLB-Lipid Panel [80061-LIPID] 7)  TLB-TSH (Thyroid Stimulating Hormone) [84443-TSH] 8)  TLB-Udip ONLY [81003-UDIP] 9)  TLB-B12 + Folate Pnl [82746_82607-B12/FOL] 10)  Medicare -1st Annual Wellness Visit [G0438] 11)  Est. Patient Level III [02542]

## 2011-01-28 LAB — DIFFERENTIAL
Basophils Absolute: 0 10*3/uL (ref 0.0–0.1)
Basophils Relative: 0 % (ref 0–1)
Eosinophils Absolute: 0.1 10*3/uL (ref 0.0–0.7)
Eosinophils Relative: 1 % (ref 0–5)
Lymphocytes Relative: 15 % (ref 12–46)
Monocytes Absolute: 0.4 10*3/uL (ref 0.1–1.0)

## 2011-01-28 LAB — POCT I-STAT, CHEM 8
BUN: 27 mg/dL — ABNORMAL HIGH (ref 6–23)
Calcium, Ion: 1.17 mmol/L (ref 1.12–1.32)
Chloride: 111 mEq/L (ref 96–112)
Creatinine, Ser: 1.4 mg/dL — ABNORMAL HIGH (ref 0.4–1.2)
Glucose, Bld: 103 mg/dL — ABNORMAL HIGH (ref 70–99)
HCT: 33 % — ABNORMAL LOW (ref 36.0–46.0)
Hemoglobin: 11.2 g/dL — ABNORMAL LOW (ref 12.0–15.0)
Potassium: 5.3 meq/L — ABNORMAL HIGH (ref 3.5–5.1)
Sodium: 139 meq/L (ref 135–145)
TCO2: 20 mmol/L (ref 0–100)

## 2011-01-28 LAB — CBC
HCT: 30.9 % — ABNORMAL LOW (ref 36.0–46.0)
Hemoglobin: 10.3 g/dL — ABNORMAL LOW (ref 12.0–15.0)
MCH: 29.9 pg (ref 26.0–34.0)
MCHC: 33.3 g/dL (ref 30.0–36.0)
MCV: 89.7 fL (ref 78.0–100.0)

## 2011-01-28 LAB — URINALYSIS, ROUTINE W REFLEX MICROSCOPIC
Bilirubin Urine: NEGATIVE
Ketones, ur: NEGATIVE mg/dL
Nitrite: NEGATIVE
pH: 6 (ref 5.0–8.0)

## 2011-01-28 LAB — POCT CARDIAC MARKERS
CKMB, poc: 1.3 ng/mL (ref 1.0–8.0)
Myoglobin, poc: 123 ng/mL (ref 12–200)
Troponin i, poc: <0.05 ng/mL (ref 0.00–0.09)

## 2011-01-28 LAB — GLUCOSE, CAPILLARY: Glucose-Capillary: 86 mg/dL (ref 70–99)

## 2011-01-28 LAB — URINE MICROSCOPIC-ADD ON

## 2011-01-28 LAB — POTASSIUM: Potassium: 5.4 mEq/L — ABNORMAL HIGH (ref 3.5–5.1)

## 2011-01-30 NOTE — Letter (Signed)
Summary: Glucose Supplies/[employer]  Glucose Supplies/[employer]   Imported By: Sherian Rein 01/23/2011 12:25:54  _____________________________________________________________________  External Attachment:    Type:   Image     Comment:   External Document

## 2011-02-23 ENCOUNTER — Telehealth: Payer: Self-pay | Admitting: *Deleted

## 2011-02-23 MED ORDER — NAPROXEN-ESOMEPRAZOLE 500-20 MG PO TBEC: 1.0000 | DELAYED_RELEASE_TABLET | Freq: Two times a day (BID) | ORAL | Status: DC

## 2011-02-23 NOTE — Telephone Encounter (Signed)
It may has been Vimovo. OK to use it

## 2011-02-23 NOTE — Telephone Encounter (Signed)
Pt does not have sample anymore. Advised pt to try otc aleve 1 bid and watch for stomach irritation. She will call back with any problems.

## 2011-02-23 NOTE — Telephone Encounter (Signed)
Pt c/o all over arthritis pain. She has been taking gen tylenol 500 mg one tid w/NO help. She states MD had given her samples of some med in the past for arthritis but lost it and does not know the name. (was a gray pill) She wants advisement from MD as to what is ok for her pain.

## 2011-02-25 NOTE — Telephone Encounter (Signed)
Agree. Thx 

## 2011-03-23 ENCOUNTER — Other Ambulatory Visit: Payer: Self-pay | Admitting: *Deleted

## 2011-03-23 MED ORDER — OXYBUTYNIN CHLORIDE 5 MG PO TABS: 5.0000 mg | ORAL_TABLET | Freq: Three times a day (TID) | ORAL | Status: DC

## 2011-03-23 NOTE — Telephone Encounter (Signed)
Patient requesting rx from MD to help w/her bladder. Says she can not control her urine.

## 2011-03-23 NOTE — Telephone Encounter (Signed)
Ok Ditropan OV w/UA next wk Thx

## 2011-03-23 NOTE — Telephone Encounter (Signed)
Patient informed. 
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
## 2011-03-27 NOTE — H&P (Signed)
[name], BOLD              ACCOUNT NO.:  [id number]   MEDICAL RECORD NO.:  [id number]          PATIENT TYPE:  INP   LOCATION:  2017                         FACILITY:  [facility]   PHYSICIAN:  Raenette Rover. Felicity Coyer, MDDATE OF BIRTH:  1933/04/12   DATE OF ADMISSION:  07/05/2007  DATE OF DISCHARGE:                              HISTORY & PHYSICAL   CHIEF COMPLAINT:  Multiple complaints with headache, nausea, vomiting,  chest discomfort and weakness.   HISTORY OF PRESENT ILLNESS:  The patient is a pleasant 77 year old  African-American  with chronic fatigue syndrome and presents to [facility] emergency room with multiple complaints including a left-sided  headache, nausea, vomiting and weakness increased since last evening.  She denies any trauma and says that she never feels well but reports a  significant change in her overall health since yesterday morning.  She  awoke and did not feel right but went to the beauty salon as scheduled  in the afternoon and then to her neighbors for a cookout in the evening.  She had a hot dog for dinner but felt sick with nausea after this.  She  began to vomit around 9:00 p.m. x1 episode, but nausea persisted despite  the emptying of her stomach.  She has had tinnitus-type ringing and  tingling in her left ear for approximately 6 months been noted a  headache with discomfort all up the side of her left neck and left head  and was associated with throat pain but denies trouble swallowing,  speaking, or talking.  She is a difficult historian and hard to engage  but says in general something happened since yesterday morning and since  she was no better this morning after 24 hours of these symptoms, she has  come to the emergency room for further evaluation.  She denies  specifically chest pain, shortness of breath or dyspnea on exertion.  No  lower extremity swelling and no changes in her medications.   PAST MEDICAL HISTORY:  Significant for:  1.  Hypertension.  2. Type 2 diabetes.  3. Reflux disease.  4. Dyslipidemia.  5. Obesity.  6. Pancreatitis.  7. Anemia.  8. History of coronary disease, reportedly with a stent in 1999.   CURRENT MEDICATIONS:  1. Amaryl 1 mg b.i.d.  2. Hydrochlorothiazide 12.5 mg daily.  3. Verapamil 180 mg daily.  4. Aspirin 325 mg daily.   ALLERGIES:  Include PENICILLIN which she thinks causes swelling.   FAMILY HISTORY:  Is unknown as she was adopted, believes that her  natural mother may have died of a stroke.   SOCIAL HISTORY:  She lives with her husband in [place].  She denies  tobacco or alcohol.  She is recently retired.   REVIEW OF SYSTEMS:  Is as above in HPI.  She is chronically constipated  with her last bowel movement occurring yesterday.  Her usual bowel  movements are every 5-6 days, and yesterday's was in accordance with  this normal pattern.  She does take Ex-Lax p.r.n.  Infrequent to rare  headaches and no previous migraines. No recent travel, no fevers, no  weight changes.   PHYSICAL EXAMINATION:  VITAL SIGNS:  Temperature 98.3, blood pressure  166/82, pulse of 105, respirations 20, O2 saturation 100% on room air.  GENERAL:  She is an overweight and reserved black female who is in no  acute distress.  However, looks unwell lying supine in the ER stretcher.  There is no family here at the time of my visit.  HEENT:  Generally unremarkable.  PERRLA.  EOMI.  Oropharynx clear.  NECK:  Her neck is supple with full range of motion and no JVD or goiter  appreciated.  LUNGS:  Clear to auscultation bilaterally with no wheeze, crackle or  rhonchi appreciated.  No increased work of breathing.  CARDIOVASCULAR:  Exam is distant but regular. No murmurs or rubs.  ABDOMEN:  Protuberant, slightly distended but generally soft with  diminished breath sounds which are, however, present.  Nontender to deep  palpation.  EXTREMITIES:  Trace ankle edema bilaterally.  NEUROLOGIC:  She is awake,  alert, oriented x4.  She follows commands and  is appropriate.  Her cognition is intact.  Cranial nerves II-XII are  symmetrically intact, and there are no gross peripheral motor deficits.  Gait has not been tested   LABORATORY DATA:  Significant for markedly abnormal cardiac enzymes with  a point-of-care myoglobin of 258, point-of-care MB of 48 and troponin I  of 3.55. Actual cardiac enzymes have been repeated, and CK is 269, MB  37.4, troponin I of 3.35. Basic metabolic panel is unremarkable with  sodium 135, potassium 4.6, chloride 104, bicarb 21, BUN 19, creatinine  0.207 and glucose of 123.  White count normal 6.7, hemoglobin of 11.0  and platelet count of 168.  Urinalysis is negative. Pending at this time  include lipase, amylase, liver function, A1c, magnesium level, coags and  D-dimer.  Of note, laboratory data done at the office in preparation for  a complete physical exam was drawn on August 20 and showed a hemoglobin  A1c of 6.0, normal LFTs and total cholesterol of 206 with LDL 115 and  HDL 70.   CT of the head is stable without acute change.  There is minimal diffuse  atrophy.   KUB is negative for acute change.   EKG shows normal sinus rhythm at 71 beats per minute without acute  ischemic change.   ASSESSMENT/PLAN:  1. Headache with nausea and vomiting, markedly abnormal cardiac      enzymes despite a relatively normal EKG.  Suspect these symptoms      are related to a non-Q-wave myocardial infarction.  The ER      physician has called cardiology, Dr. Eden Emms, who has evaluated the      patient but prefers admission by primary care due to her multitude      of atypical cardiac complaints.  She will be admitted to telemetry      at [facility].  We will continue medical management as      ordered with IV heparin, aspirin, beta blocker, and serial cardiac      enzymes with telemetry monitoring.  Check 2-D echocardiogram to      evaluate LV function and wall  motion abnormality. Question timing      of catheterization as I suspect this will be necessary but defer to      cardiology to arrange.  2. Headache.  CT head is unremarkable.  No evidence of bleed. Okay to      continue heparinization. Question if relation to angina,  especially      with left-sided neck pain being the predominant complaint of this      headache. Monitor while on nitroglycerin for exacerbation of a      headache.  Neurologic exam is unremarkable and no previous history      of migraines.  3. Nausea and vomiting.  Again, suspect this is related to her acute      coronary syndrome.  Noted chronic constipation, but KUB negative      for ileus or impaction.  Will treat for symptoms with Phenergan and      Zofran as well as follow up on amylase, lipase and LFTs to rule out      other GI abnormality, proton pump inhibitor as ordered, and okay      for carbohydrate-modified diet as tolerated.  4. Type 2 diabetes.  Patient has a normal hemoglobin A1c via lab check      earlier in the week.  Have written to hold Amaryl due to her GI      complaints, and sliding scale insulin will be used before meals or      food and nightly.  5. History of dyslipidemia, not currently on statins.  Repeat      fasting lipid profile pending for the morning.  6. Hypertension. Beta blocker has been ordered in the setting of acute      coronary syndrome.  Hold verapamil and hydrochlorothiazide. Further      titration as needed.      Valerie A. Felicity Coyer, MD  Electronically Signed     VAL/MEDQ  D:  07/05/2007  T:  07/06/2007  Job:  [id number]

## 2011-03-27 NOTE — Discharge Summary (Signed)
[name], SCHAAFSMA              ACCOUNT NO.:  [id number]   MEDICAL RECORD NO.:  [id number]          PATIENT TYPE:  INP   LOCATION:  1419                         [facility]ACILITY:  [facility]   PHYSICIAN:  Valerie A. Felicity Coyer, MDDATE OF BIRTH:  [birth year]   DATE OF ADMISSION:  12/07/2007  DATE OF DISCHARGE:  12/10/2007                               DISCHARGE SUMMARY   DISCHARGE DIAGNOSES:  1. Chest pain of unclear etiology.  2. Escherichia coli urinary tract infection.  3. Anemia.  4. Mild renal insufficiency.  5. Hypoglycemia.   HISTORY OF PRESENT ILLNESS:  The patient is a 77 year old African-  American female with history of hypertension, hyperlipidemia, type 2  diabetes, and coronary artery disease who presented to the emergency  room on the day of admission with complaints of nausea, chest  fluttering, and dizziness.  She stated that these episodes of chest  fluttering have been happening approximately every other day.  She felt  the episode on the day of admission was more severe and she became  nervous and came to the ER.  She describes the pain as chest discomfort  described as stretching or pulling sensation, episodes lasting  approximately 15 minutes.  The pain is nonradiating.  Anxiety is often  associated with this pain.  Due to the patient's history of coronary  artery disease with previous stent placement in 1999 the patient was  admitted to the hospital for further evaluation.   PAST MEDICAL HISTORY:  1. Iron deficiency anemia.  2. Coronary artery disease status post stent placement in 1999.  3. Depression.  4. Gout.  5. Hypertension.  6. Osteoporosis.  7. History of pancreatitis.  8. Mild renal insufficiency.  9. Dyslipidemia.  10.Vitamin B12 deficiency.  11.Type 2 diabetes.  12.GERD.  13.Chronic fatigue.  14.Anxiety.  15.Cholecystectomy.  16.Hemorrhoidectomy.  17.Hysterectomy.   HOSPITAL COURSE:  Problem 1.  Atypical chest pain.  There were no  documented  arrhythmias on telemetry throughout the patient's  hospitalization.  Cardiac markers were equivocal with troponin levels up  to 0.09.  The patient did undergo evaluation by cardiology who felt the  patient's symptoms were unrelated to any acute ischemia.  The patient  has been scheduled for follow-up as outpatient with Bevelyn Buckles. Bensimhon,  M.D. on February 25.  It was felt that the patient was medically stable  for discharge and no further evaluation was necessary during this  hospitalization.   Problem 2.  Anemia.  Note, the patient with chronic disease with history  of iron deficiency anemia and B12.  However, during this admission  anemia panel was within normal limits.  Hemoglobin drifted from 9.7 to  8.4.  Question whether decrease secondary to intravenous fluids versus  slow GI loss.  The patient reports colonoscopy approximately two years  ago which was negative for any acute findings per her recall.  Stool was  heme negative at discharge.  The patient will likely need outpatient  follow-up with GI to be scheduled by her primary care physician.   Problem 3.  Escherichia coli UTI.  Questionable early pyelonephritis due  to increased amylase  on admission, now having returned to normal as well  as back pain and spasm the patient reported on admission which have  improved now.  The patient is to continue Cipro for a total of 7 days  treatment.   Problem 4.  Mild renal insufficiency likely secondary to problem #3,  resolved with IV fluids.   Problem 5.  Hypoglycemia.  The patient experienced frequent episodes of  hypoglycemia during this hospitalization which seemed to exacerbate  anxiety.  No insulin was given during the hospitalization.  The patient  has instructed to discontinue her Amaryl until further follow-up with  primary care physician.  A1C in August of 2008 was 5.9.   Problem 6.  Anxiety and depression.  The patient was started on  Citalopram which will hopefully  improve her mood.   DISCHARGE MEDICATIONS:  1. Ranitidine 150 mg p.o. daily.  2. Aspirin 325 mg p.o. daily.  3. Vitamin B12 1000 mcg p.o. daily.  4. Cholecalciferol 1000 units p.o. daily.  5. Lisinopril 10 mg p.o. daily.  6. Citalopram 20 mg p.o. daily.  7. Cipro 500 mg p.o. b.i.d. until gone.  8. Fish Oil one tablet p.o. daily.   DISCHARGE LABORATORY DATA:  Hemoglobin 8.4, hematocrit 24.4.  Sodium  136, potassium 4.9, creatinine 1.19, amylase 119, iron 44, vitamin B12  875, ferritin 313, TSH 0.956.  Hemoglobin A1C 5.7 on December 08, 2007.   DISPOSITION:  The patient is instructed to follow up with [name].  Plotnikov, M.D. on February 11, at 3:15 as well as Dr. Gala Romney on  February 25, at 8:45 a.m.  The patient is asked to call the office  should any changes need to be made with these appointments.      Cordelia Pen, NP      Raenette Rover. Felicity Coyer, MD  Electronically Signed    LE/MEDQ  D:  12/10/2007  T:  12/10/2007  Job:  [id number]

## 2011-03-27 NOTE — Discharge Summary (Signed)
[name], [name]              ACCOUNT NO.:  [id number]   MEDICAL RECORD NO.:  [id number]          PATIENT TYPE:  INP   LOCATION:  2017                         FACILITY:  [facility]   PHYSICIAN:  Bevelyn Buckles. Bensimhon, MDDATE OF BIRTH:  1933-10-25   DATE OF ADMISSION:  07/05/2007  DATE OF DISCHARGE:  07/09/2007                               DISCHARGE SUMMARY   PRIMARY DIAGNOSIS:  Chest pain, medical therapy for coronary artery  disease.   SECONDARY DIAGNOSES:  1. Gastritis/gastroesophageal reflux disease.  2. Hypertension.  3. Diabetes.  4. Status post Taxus stent to the left anterior descending in 1999 by      Dr. Amil Amen, no further details available.  5. History of anemia.  6. Chronic fatigue.  7. Chronic exertional dyspnea.  8. Central obesity.  9. Status post partial hysterectomy, tubal ligation, and      cholecystectomy.  10.ALLERGY OR INTOLERANCE TO PENICILLIN.  11.Family history of cerebrovascular accident.  12.Iron deficiency.  13.History of pancreatitis.  14.Arthritis.   TIME SPENT AT DISCHARGE:  45 minutes.   PROCEDURES:  1. Cardiac catheterization.  2. Coronary arteriogram.  3. Left ventriculogram.  4. Esophagogastroduodenoscopy.  5. CT of the head without contrast.  6. Abdominal x-ray.   HOSPITAL COURSE:  [name] is 77 years old with a history of  coronary artery disease.  She was seen by Dr. Gala Romney in 2005 but not  since then.  She had a week of general malaise and also complained of  headache, nausea, and vomiting.  She was admitted for further  evaluation.   Her cardiac enzymes were elevated, indicating a non-ST segment elevation  MI.  The peak CK-MB was 308/39.4 with a peak troponin I of 7.47.  A  lipid profile showed a total cholesterol of 206, triglycerides 67, HDL  70, LDL 106.  She was initially anticoagulated, but her hemoglobin fell  to 8.4.  She was seen by Internal Medicine, and an iron profile was  performed.   The iron profile  showed an iron that was low and 29.  Her B12 level was  high at greater than 2000, and her ferritin level was within normal  limits at 228.  She was started on iron supplementation.  There was  concern for a GI origin of her bleeding, and a GI consult was called.   She was seen by the GI physicians who felt that further evaluation  including an EGD was indicated.  The EGD showed a hiatal hernia and  gastritis.  The gastritis was considered a possible source for GI blood  loss, and she was started on a proton pump inhibitor.   Her hemoglobin stabilized at 9.5, and she was taken to the cath lab.  In  the cardiac cath lab, she had a 20% left main, 30% in-stent restenosis  in the LAD, and a diagonal with an 80% lesion.  The films were evaluated  by Dr. Excell Seltzer who felt that medical therapy was recommended.  Her left  ventricular function was normal.  She was started on Imdur.  She was  originally on Lopressor, but this  was changed to Coreg 6.25 b.i.d.  She  had been on verapamil 180 prior to admission as well as HCTZ 12.5, but  these are currently on hold.  Her blood pressure currently is elevated,  but her medical therapy has been inconsistent because of multiple  procedures.  She is to follow up as an outpatient with her family  physician as well as Dr. Gala Romney and with Dr. Arlyce Dice as needed.  By  July 09, 2007, [name] was still complaining of some fatigue but  in general felt better.  It was felt that as she continued on the iron  supplementation and the proton pump inhibitor, her hemoglobin would  improve and she would feel stronger.  She was evaluated by Dr. Gala Romney  and considered stable for discharge with outpatient followup arranged.   DISCHARGE INSTRUCTIONS:  1. Her activity level is to be increased gradually.  2. She is not to drive for 2 days and no lifting for a week.  3. She is to stick to a low-sodium diabetic diet.  4. She is to call our office for any problems  with the cath site.  5. She is to follow up with Dr. Gala Romney on July 24, 2007 at      8:45 and with Dr. Posey Rea as well (his office will call).  She is      to follow up with Dr. Arlyce Dice as needed.   DISCHARGE MEDICATIONS:  1. Lisinopril 10 mg daily.  2. MiraLax 17 grams b.i.d., hold for diarrhea.  3. Aspirin 81 mg daily.  4. Amaryl 1 mg b.i.d.  5. Vitamin B12 is on hold  6. Imdur 30 mg a day.  7. Carvedilol 6.25 b.i.d.  8. Iron 325 mg b.i.d.  9. Nitroglycerin sublingual p.r.n.  10.Protonix 40 mg daily.  11.Tylenol Arthritis daily.      Theodore Demark, PA-C      Bevelyn Buckles. Bensimhon, MD  Electronically Signed    RB/MEDQ  D:  07/09/2007  T:  07/10/2007  Job:  [id number]   cc:   [name]. Plotnikov, MD  Barbette Hair. Arlyce Dice, MD,FACG

## 2011-03-27 NOTE — H&P (Signed)
[name], [name]              ACCOUNT NO.:  [id number]   MEDICAL RECORD NO.:  [id number]          PATIENT TYPE:  INP   LOCATION:  2017                         FACILITY:  [facility]   PHYSICIAN:  Noralyn Pick. Eden Emms, MD, FACCDATE OF BIRTH:  October 23, 1933   DATE OF ADMISSION:  07/05/2007  DATE OF DISCHARGE:                              HISTORY & PHYSICAL   Mrs. [name] is a 77 year old patient of Dr. Posey Rea and Dr.  Gala Romney.  She came to the [facility] ER for complaint of malaise,  headache, nausea and vomiting.  The patient has not felt well for a  week.  She has had no energy.  She has described neck pain and some  choking.  She has had a buzzing in her left ear.  She was to see Dr.  Debby Bud or Dr. Posey Rea next week.  Sometimes this happens to her when  her blood pressure is high.  She subsequently had increasing problems  with the headache.  At about 9 o'clock last night this progressed to her  left side with some nausea and vomiting.  She was unable to sleep.  She  subsequently had some dry heaves and developed left anterior chest pain,  but only after she had vomited.  She also has a tingling and pulling  sensation in her chest.  Patient has chronic exertional dyspnea.  This  has not changed.  There is no history of COPD.  She has not had a fever  or sputum production.   Patient does have a history of pancreatitis.  Some of her symptoms of  nausea and vomiting and reminiscent of this.  She has had her  gallbladder out.  She also has significant reflux and GERD.   Her coronary risk factors include hypertension and diabetes.  She has  had a stent by Dr. Amil Amen in 1999.  I do not have details of this;  however, she did have a Myoview in January of 2006, which was  nonischemic and had an EF of 53%.   REVIEW OF SYSTEMS:  Otherwise remarkable for some chronic lower  extremity pain.  she says her legs hurt all the time.  In regards to her  headache, there has been no photophobia.   She again feels that she gets  headaches at times when her blood pressure is up.  She has been  compliant with her medications.  She says her blood sugars always run  low and she is not on insulin.  She says that she checks her sugar at  home and it usually runs low.  Patient's review of systems otherwise  negative.   PAST MEDICAL HISTORY:  Includes pancreatitis and GERD, history of  anemia, history of hypertension, diabetes for 4 years, chronic fatigue,  chronic exertional dyspnea, coronary artery disease with previous stent  in 1999, central obesity, she is status post hysterectomy, tubal  ligation, and cholecystectomy.   ALLERGIES:  PENICILLIN.   MEDICATIONS:  Meds that her family brought with them included  glimepiride 1 mg b.i.d., hydrochlorothiazide 12.5 a day, verapamil 180 a  day, vitamin B12, and an aspirin.  SOCIAL HISTORY:  She lives in [place] with her husband.  She is  retired.  She does not smoke or drink.  She has 6 children and many  grandchildren.  Her husband, daughter, and great grandchild were with  her today.  In general, she can do yard work and activities of daily  living, but does have chronic exertional dyspnea.   FAMILY HISTORY:  Mother died of a CVA.  Father has passed away, but he  was adopted.   PHYSICAL EXAMINATION:  VITAL SIGNS:  Remarkable for blood pressure of  166/82, pulse is 71 and regular.  There are no PVCs and no arrhythmias.  She is afebrile.  Sats were 100% on room air.  Respiratory rate is 16  with somewhat shallow breaths.  HEENT:  Normal.  NECK:  Carotids normal without bruits.  There is no lymphadenopathy.  No  thyromegaly.  No JVP elevation.  LUNGS:  Clear with somewhat decreased diaphragmatic motion.  There is no  wheezing.  HEART:  There is an S1 and S2 with normal heart tones.  PMI is not  palpable.  ABDOMEN:  Protuberant.  She has a midline and a cholecystectomy scar.  There is no gross tenderness or rebound.  There is no  evidence of right  upper quadrant pain.  There is no AAA.  No hepatosplenomegaly or  hepatojugular reflux.  EXTREMITIES:  Distal pulses are intact.  PTs are +3.  There is trace  lower extremity edema.  NEUROLOGICAL:  Nonfocal.  There is no muscular weakness.  SKIN:  Warm and dry.   LABORATORY DATA:  Initial lab work is remarkable for hematocrit of 32.8,  white count of 6.7, BUN 19, creatinine 0.3.  POC markers positive with ?  CPK greater than 4,000 but MB less than 1 %.  Her EKG shows no acute  changes, sinus rhythm with LVH.  Chest x-ray shows a tortuous aorta with  some calcification and fairly prominent loops of colon above the left  hemidiaphragm.   IMPRESSION:  1. Headache.  Patient has no focal neurological signs.  It may be      secondary to her pressure being up.  She is currently on IV nitro      in the emergency room and her pressure is coming down.  We will      reinstitute her medications.  Apparently she has not taken them      recently as she ran out of them.  I think it is reasonable to hold      her diuretic and treat her with verapamil and possibly hydralazine,      in addition to her nitrates.  She may need a head CT if her      headache worsens.  2. Nausea and vomiting.  May be an angina equivalent in an elderly      diabetic with history of coronary disease, but she also has a      history of pancreatitis.  Her abdominal exam is benign.  She does      have prominent loops of colon under the left diaphragm and may have      gastroparesis.  I will leave this up to primary care to see if she      needs Reglan or KUB.  LFTs, amylase and lipase will be done given      her history.  3. Positive point of care markers.  She has a distant history of a  stent with a nonischemic Myoview in 2006.  The nausea and vomiting      may represent an angina equivalent or subendocardial MI.  Her EKG      is reassuring.  However her general malaise, body aches, headache,       nausea and vohmiting may indicate rhabdomyolysis. Depending on her      course, she may very well need to have a heart catheterization,      depending on what her CPK and troponins run.  She clearly does not      have any ST elevation.  She will be maintained on heparin and nitro      once we make sure that her headache does not represent a CNS event.      Her verapamil will be substituted for beta blocker and enzymes will      be monitored.  I would prefer to have the patient over at [facility] since there may be a coronary syndrome going on and there is a      chance she will need a heart cath.  4. Chronic anemia.  Continue vitamin B12.  Check MCV.  5. Diabetes.  Check hemoglobin A1c.  She is on very little medicine      and says her sugars run low.  Oral hypoglycemics will have to be      held if she has a cath.  I do not think that she will require      sliding scale insulin.  6. Lower extremity edema, on hydrochlorothiazide, current stable.  We      will hold this since she appears to be a little bit prerenal and      has been having some nausea and vomiting.  7. The patient will be admitted by her primary care and we will be      happy to follow along and Dr. Gala Romney will see the patient on      Monday.     Noralyn Pick. Eden Emms, MD, [facility]  Electronically Signed    PCN/MEDQ  D:  07/05/2007  T:  07/05/2007  Job:  [id number]

## 2011-03-27 NOTE — Assessment & Plan Note (Signed)
[facility]                            CARDIOLOGY OFFICE NOTE   NAME:Jefferson, Shelby VAQUERA                     MRN:          [id number]  DATE:01/07/2008                            DOB:          03-13-1933    PRIMARY CARE PHYSICIAN:  Dr. Posey Rea.   INTERVAL HISTORY:  [name] is a 77 year old woman with multiple  medical problems including hypertension, hyperlipidemia, diabetes,  gastroesophageal reflux disease and coronary artery disease status post  stenting of her LAD in 1999.  She underwent cardiac catheterization at  the end of 2008 showed a 20% left main 30% in-stent restenosis of the  LAD, and diagonal 80% lesion.  I have treated medically.   She was recently readmitted a few weeks ago with atypical chest pain and  fluttering.  She did not have any other arrhythmias on monitor.  Her  enzymes were normal with her pain was thought to be quite atypical.  She  was discharged medical therapy.   She returns today for team follow-up.  She says she feels terrible.  She  is very fatigued and feels depressed as usual.  Her husband is very cold  to her and does not talk to her.  There is no physical abuse.  She  denies any chest pain.  She does have occasional fluttering in her heart  which can last a couple minutes.  No syncope or presyncope.  She has not  had a lower extremity edema.  She does have significant dyspnea and knee  pain which limits her activity.  Current medications are Tylenol  arthritis, aspirin 81, lisinopril 10 b.i.d., metformin 500 b.i.d. and  vitamin B12, her carvedilol and Norvasc and Imdur were stopped.  Unsure  why.   EXAM:  She had no acute distress.  She remains on the clinic without any  respiratory difficulty.  Blood pressure is 160/88 with warm heart rate of 68, weights 205.  HEENT is normal.  Neck is supple.  No JVD.  Carotid 2+ bilateral bruits.  There is no  lymphadenopathy or thyromegaly.  CARDIAC:  She has a  regular rate and rhythm.  PMI is nondisplaced.  There is a soft systolic ejection murmur at left sternal border.  LUNGS:  Clear.  ABDOMEN:  Obese, nontender, nondistended hepatosplenomegaly, no bruits,  no masses.  Good bowel sounds.  EXTREMITIES:  Warm with no cyanosis, clubbing or edema.  NEURO:  She is alert, oriented x3.  Cranial nerves II-XII are intact.  Moves all four extremities without difficulty.  The affect is flattened.   EKG shows normal sinus rhythm with LVH and no significant ST-T wave  abnormalities rate 68.   ASSESSMENT/PLAN:  1. Coronary artery disease is stable.  I do not think her chest pain      is ischemic.  However, given her fatigue and dyspnea and is      reasonable to proceed with a Myoview test to rule out any      significant ischemia.  2. Palpitations.  There is nothing on her monitor in the hospital,      will put 48  hour Holter monitor just to make sure she is having any      arrhythmias at home.  3. Hypertension.  For some reason she is off her most her      antihypertensives.  We will start her back on Coreg given her      history of coronary disease and she will follow up with Dr.      Posey Rea.  4. Fatigue.  I think most of this is probably depression, which she      endorses.  Suggested exercise program and try to enroll her HOPE      program at [facility].  We will try to her the details soon was called to      let left messages with the program.   Will see her back in several months.     Bevelyn Buckles. Bensimhon, MD  Electronically Signed    DRB/MedQ  DD: 01/07/2008  DT: 01/07/2008  Job #: [id number]   cc:   [name]. Plotnikov, MD

## 2011-03-27 NOTE — Consult Note (Signed)
[name], [name]              ACCOUNT NO.:  [id number]   MEDICAL RECORD NO.:  [id number]          PATIENT TYPE:  INP   LOCATION:  1419                         FACILITY:  [facility]   PHYSICIAN:  Jonelle Sidle, MD DATE OF BIRTH:  14-Dec-1932   DATE OF CONSULTATION:  12/08/2007  DATE OF DISCHARGE:                                 CONSULTATION   PRIMARY CARDIOLOGIST:  Bevelyn Buckles. Bensimhon, MD.   PRIMARY CARE PHYSICIAN:  [name]. Plotnikov, MD.   REASON FOR CONSULTATION:  Chest pain and palpitations.   HISTORY OF PRESENT ILLNESS:  [name] is a 77 year old woman  followed by Dr. Gala Romney and last seen in the office in September 2008.  She has history of hypertension, hyperlipidemia, type 2 diabetes  mellitus, gastroesophageal reflux disease and coronary artery disease  status post previous stent placement to the left anterior descending in  1999.  Most recent angiography within the last year demonstrated a 20%  left main, 30% in-stent restenosis within the left anterior descending,  and an 80% stenosis within the diagonal branch.  This was managed  medically.  Symptomatically, [name] describes a chronic recurrent  problem with intermittent feeling of chest discomfort described as a  stretching or pulling sensation that is fairly sporadic, although  sometimes associated with activity.  There seems to be some association  to emotional upset and also anxiety.  The patient describes a fluttering  in her chest at this time and states that these episodes last for  approximately 15 minutes.  She reports that these have been more  frequent in general since her medications were changed within the last  year.  She has also had some spasms in her lower back which is a new  symptom.  She has no associated cough or wheezing with these episodes  and has had no documented arrhythmia on telemetry.  Her cardiac markers  are equivocal with troponin I levels up to 0.08 and her CK and  CK-MB  levels have been normal with the exception of her last set which show an  unusual trend to a very elevated level in the absence of any chest pain  in the hospital.  It may well be that these are in error.  Follow-up  blood work is pending.  We have been asked to evaluate the patient.   The patient has concurrently been diagnosed with a urinary tract  infection and is being treated with antibiotics.   Her electrocardiogram otherwise shows sinus rhythm with nonspecific T-  wave changes.  Chest x-ray demonstrated no acute cardiopulmonary disease  pattern.   ALLERGIES:  Reported as PENICILLIN, NORVASC, TRAMADOL, GLUCOPHAGE,  LISINOPRIL, IODINE, VISTARIL, ATENOLOL and MAXZIDE.   CURRENT MEDICATIONS:  1. Aspirin 325 mg p.o. daily.  2. Calciferol 800 units p.o. daily.  3. Cipro 500 mg p.o. b.i.d.  4. Cyanocobalamin 1000 mcg p.o. daily.  5. Pepcid 20 mg p.o. daily.  6. Amaryl 1 mg p.o. b.i.d.  7. NovoLog sliding scale.  8. Zestril 10 mg p.o. daily.  9. Xanax 0.25 mg p.o. q.6h. p.r.n.  10.Phenergan p.r.n.   Past  medical history, social history and family history have all been  reviewed recently.   ADDITIONAL MEDICAL PROBLEMS:  1. Chronic renal insufficiency.  2. Gouty arthritis.  3. Obesity.  4. Osteoporosis.  5. Previous history of pancreatitis.  6. Prior hysterectomy.  7. Hemorrhoidectomy.  8. Cholecystectomy.  9. Bilateral tubal ligation.   SOCIAL HISTORY:  She lives in [place], [place], with her  husband.  She is retired.  No tobacco or alcohol use.   FAMILY HISTORY:  Patient was adopted and therefore uncertain about  biological family history.   REVIEW OF SYSTEMS:  As described above.  Patient states that she has had  an 8 pound weight gain over six months.  She has rare headaches, wears  reading glasses.  Uses dentures.  No recent rashes, fever or chills.  Describes radiation of her chest pain up into the neck with choking  sensation.  Has  intermittent panic attacks and anxiety.  Reports  increased psychosocial stress.  Occasional reflux symptoms,  constipation, arthralgias.  All other systems are negative.   PHYSICAL EXAMINATION:  VITAL SIGNS:  Temperature 97.5 degrees, heart  rate 85, respiratory rate 18, blood pressure 150/77, oxygen saturation  100% on room air.  GENERAL APPEARANCE:  This is an overweight woman in no acute distress  without active chest pain.  HEENT:  Conjunctivae and lids normal.  Oropharynx clear with dentures.  NECK:  Supple.  No elevated jugular venous pressure.  No loud bruits.  No thyromegaly or thyroid tenderness.  LUNGS:  Clear without labored breathing, no wheezing noted.  CARDIOVASCULAR:  Regular rate and rhythm, no pericardial rub or S3  gallop.  No loud murmur.  ABDOMEN:  Obese, nontender, no obvious hepatomegaly, bowel sounds  present.  EXTREMITIES:  No significant pitting edema.  Distal pulses 2+.  SKIN:  Warm and dry.  MUSCULOSKELETAL:  No kyphosis noted.  NEUROPSYCHIATRIC:  Patient alert and oriented x3.   LABORATORY DATA:  The wbc is 4.5, hemoglobin 9.7, hematocrit 27.6,  platelets 145.  Sodium 136, potassium 4.4, chloride 108, bicarb 21,  glucose 93, BUN 37, creatinine 1.2.  Hemoglobin A1c 5.7.  CK 104-64,  then up to 2322.  CK-MB 1-1.2, then up to 23.2.  All relative indices  are normal.  Troponin I 0.07, up to 0.08.  Urine cultures pending.  Urinalysis with many bacteria, large leukocytes, positive nitrites,  trace blood, cloudy appearance.   IMPRESSION:  1. Recurrent episodes of chest pain as outlined above. This is on a      baseline of known coronary disease, status post previous stent      placement to left anterior descending with angiography in August of      2008, demonstrating mild restenosis within the stent and left      anterior descending and 80% diagonal that was best managed      medically.  Overall, troponin I levels are equivocal and the trend      in CK and  CK-MB levels is unusual, question lab error.  The patient      has had no further symptoms of chest pain during hospital stay and      her electrocardiogram is nonspecific overall.  She has had no      documented arrhythmia on telemetry.  She does have a history of      pancreatitis and reflux disease.  2. Apparent urinary tract infection, being treated with Cipro.   RECOMMENDATIONS:  Etiology of the patient's chest pain is  not clear at  this point.  Would suggest continuing to cycle cardiac markers to  exclude lab error and assess for actual trend.  Would also send an  amylase and lipase and continue telemetry monitoring overnight.  If her  cardiac markers in fact do consistently increase into abnormal range,  she will likely need further ischemic evaluation, possibly even a  cardiac catheterization given her history, although her symptoms are a  bit unusual.  Some of her back spasm might be related to urinary tract  infection which is presently being treated.  She denies any frank  syncope and has had no documented dysrhythmia as yet.  Our service will  follow with you.      Jonelle Sidle, MD  Electronically Signed     SGM/MEDQ  D:  12/08/2007  T:  12/08/2007  Job:  [id number]   cc:   Bevelyn Buckles. Bensimhon, MD  [address], [place] [address]   [name]. Plotnikov, MD  [address]  [place]  [place] [address]

## 2011-03-27 NOTE — Cardiovascular Report (Signed)
[name], [name]              ACCOUNT NO.:  [id number]   MEDICAL RECORD NO.:  [id number]          PATIENT TYPE:  INP   LOCATION:  2017                         FACILITY:  [facility]   PHYSICIAN:  Veverly Fells. Excell Seltzer, MD  DATE OF BIRTH:  03/02/1933   DATE OF PROCEDURE:  07/08/2007  DATE OF DISCHARGE:                            CARDIAC CATHETERIZATION   PROCEDURE:  Left heart catheterization, selective coronary angiography,  left ventricular angiography, StarClose of the right femoral artery.   PROCEDURAL INDICATIONS:  [name] is a 77 year old woman with known  coronary artery disease.  She has had previous stenting of her LAD back  in 1999.  She presented to the hospital with multiple nonspecific  complaints including malaise, nausea, vomiting, headache, and some  atypical chest pains.  Her total CPK was markedly elevated.  There were  anterolateral T-wave changes suggestive of ischemia.  She was stabilized  from a medical standpoint and ultimately has been referred for cardiac  catheterization in the setting of her abnormal enzymes and EKG changes.   Risks and indications of the procedure were reviewed with the patient.  Informed consent was obtained.  The right groin was prepped, draped, and  anesthetized with 1% lidocaine.  Using the modified Seldinger technique,  a 6-French sheath was placed in the right femoral artery.  Multiple  views of the right and left coronary arteries were taken using standard  6-French preformed Judkins catheters.  Following selective coronary  angiography, an angled pigtail catheter was inserted into the left  ventricle, where pressures were recorded.  A left ventriculogram was  performed.  A pullback across the aortic valve was done.  At the  completion of the procedure, a StarClose device was used to seal the  femoral arteriotomy.  There were no immediate complications.  All  catheter exchanges were performed over a guidewire.   FINDINGS:  Aortic  pressure 149/61, with a mean of 96.  Left ventricular  pressure is 154/10.  There is no significant aortic valve gradient.   The left mainstem is calcified.  It divides into the LAD, circumflex,  and intermediate branch.  There is minor narrowing at the distal left  main stem of approximately 20%.   The LAD has mild diffuse calcification throughout its proximal and  midportions.  Proximal LAD has minor nonobstructive disease.  Just after  the second diagonal branch, there is a patent stent, with a 30%-40% area  of in-stent restenosis in the midportion of the stent.  The remaining  portions of the mid and distal LAD are free of significant angiographic  disease.  The first diagonal branch has a 50% stenosis throughout its  proximal portion.  The second diagonal branch has an 80% ostial stenosis  and then bifurcates into twin vessels.  Both diagonal appear to be 2 mm  arteries.   The intermediate branch is medium sized and is widely patent.   The left circumflex is a large-caliber vessel.  It is tortuous  throughout its proximal portion.  It branches down and provides a large  second OM branch.  The AV groove circumflex is  relatively small beyond  the second OM.  There is no significant angiographic disease in the left  circumflex system.   The right coronary artery is a dominant vessel.  Distally, it divides  into a PDA branch and posterior AV segment which supplies three  posterolateral branches.  There is a conus branch from the proximal  portion of the right coronary artery.  There are minor luminal  irregularities throughout the mid and distal portions of the right  coronary artery that are nonobstructive and no greater than 10%-20%  areas of stenosis.   Left ventricular function assessed by right anterior oblique  ventriculography shows normal LV systolic function, with an LVEF  estimated between 55%-60%, with no mitral regurgitation.   ASSESSMENT:  1. Single-vessel coronary  artery disease, with a patent stent in the      mid-left anterior descending and ostial diagonal branch stenosis.  2. Minimal left circumflex and right coronary artery disease.  3. Normal left ventricular function.   DISCUSSION:  I suspect that [name]'s coronary artery disease was  unrelated to her cardiac enzyme rise, as she has no significant areas  that can explain a subendocardial MI.  There is TIMI III flow in all  vessels.  There is some ostial diagonal branch stenosis that is  angiographically significant but does not warrant intervention.  The  patient has had no angina.  I recommend ongoing medical therapy and  secondary risk reduction.      Veverly Fells. Excell Seltzer, MD  Electronically Signed     MDC/MEDQ  D:  07/08/2007  T:  07/09/2007  Job:  [id number]   cc:   [name]. Plotnikov, MD

## 2011-03-27 NOTE — Consult Note (Signed)
NAMEFREDRICK, DRAY              ACCOUNT NO.:  [id number]   MEDICAL RECORD NO.:  [id number]          PATIENT TYPE:  INP   LOCATION:  2017                         FACILITY:  [facility]   PHYSICIAN:  Noralyn Pick. Eden Emms, MD, FACCDATE OF BIRTH:  October 12, 1933   DATE OF CONSULTATION:  DATE OF DISCHARGE:                                 CONSULTATION   Ms. Lampert is a 77 year old patient of Dr. Gala Romney.  She has history  of coronary artery disease.  She has had a stent in 1999 by Dr. Amil Amen.  I do not have the details of this.  She had a Myoview study   ***DICTATION ENDED AT THIS POINT***      Peter C. Eden Emms, MD, [facility]     PCN/MEDQ  D:  07/05/2007  T:  07/05/2007  Job:  [id number]

## 2011-03-30 NOTE — H&P (Signed)
NAME:  [name], [name]                        ACCOUNT NO.:  [id number]   MEDICAL RECORD NO.:  [id number]                   PATIENT TYPE:  INP   LOCATION:  6705                                 FACILITY:  [facility]   PHYSICIAN:  Marcene Duos, M.D.         DATE OF BIRTH:  11-08-1933   DATE OF ADMISSION:  12/02/2002  DATE OF DISCHARGE:                                HISTORY & PHYSICAL   CHIEF COMPLAINT:  Epigastric pain.   HISTORY OF PRESENT ILLNESS:  This is a 77 year old female patient of Dr.  Lilla Shook with a history of hypertension, diabetes mellitus type 2,  coronary artery disease who presented this morning to the emergency room  with a history of awaking around 4 a.m. with gas and indigestion-like  epigastric discomfort.  She did have some nausea with emesis x1 which she  described as green fluid, no food particles.  The patient denies any  radiation of the pain.  She has had no diarrhea but she has had chronic  constipation and does give a history of digitally disimpacting herself the  evening prior.  She had some scant bright red blood per rectum with this, no  melena.  Her last meal was around 5 p.m. last evening and consisted of  [employer] fried fish, cole slaw and potatoes.  The patient does have a history  of cholecystectomy in 1988 and states she had a history of a similar episode  last summer lasting less than 24 hours.  She made herself vomit and did feel  better.  The patient was found to have an elevated lipase and amylase in the  emergency department as total bilirubin mildly elevated 1.7 with an SGOT of  66.   PAST MEDICAL HISTORY:  1. Osteoarthritis.  2. Diabetes mellitus type 2 controlled per patient.  3. Hypertension.  4. Hyperlipidemia.  The patient is unaware of elevated triglycerides.  5. History of coronary artery disease, history of MI.  She states she had a     stenting x1 she believes two years ago.  6. History of DVT.   PAST SURGICAL  HISTORY:  1. Cholecystectomy in 1988.  2. Bilateral tubal ligation.  3. Hemorrhoidectomy.   MEDICATIONS:  1. Tylenol No. 3 one to two every eight hours as needed for arthritis pain.  2. Caltrate D 600 mg daily.  3. Glyburide 5 mg p.o. q.d.  4. Chlorthalidone 50 mg p.o. q.d.  5. Zocor 40 mg half tablet daily.  6. Tylenol Arthritis p.r.n.   ALLERGIES:  ASPIRIN causes palpitations.  PENICILLIN causes swelling.   HABITS:  Does not use tobacco or alcohol.   FAMILY HISTORY:  Father 7 with diabetes.  Mother 31 heart disease.  She has  five siblings of 10 still living.  She is only able to recall heart disease,  death from a gunshot wound, and liver cancer in three of her siblings who  have died.  She  has six children.  She states all of them are healthy.   SOCIAL HISTORY:  The patient is married for 47 years.  She lives at home  with her husband and oldest son.  She works at [employer].   REVIEW OF SYSTEMS:  The patient denies any lung problems, though she was  apparently short of breath on arrival to the emergency room though her  saturation was 100% on room air.  CARDIOVASCULAR:  She denies any recent  chest pain.   PHYSICAL EXAMINATION:  VITAL SIGNS:  Temperature 98.4, blood pressure  166/73, pulse 81, respiratory rate 22, saturation 98% I believe on two  liters.  HEENT:  Pupils are equal, round and reactive to light.  Extraocular  movements intact.  Tympanic membranes clear.  Throat without injection.  NECK:  Supple without adenopathy, no thyromegaly.  CHEST:  Clear.  CARDIOVASCULAR:  Regular rate and rhythm with normal S1 and S2, no S3,  positive S4.  She has a 2/6 systolic ejection murmur at left sternal border  that radiates to the right second inner space and possibly mildly into the  carotids.  No carotid bruits.  Carotid, radial, femoral, DT pulses  normodynamic and equal.  ABDOMEN:  Soft with bowel sounds present throughout.  Moderately tender in  the epigastrium,  very pinpoint in the mid epigastrium.  No right upper  quadrant or really left upper quadrant discomfort.  No rebound or peritoneal  signs.  No hepatosplenomegaly.  EXTREMITIES:  No lower extremity edema though her legs are obese.  NEUROLOGIC:  Alert and oriented x3. Cranial nerves II-XII grossly intact.  Motor is 5/5 throughout.   LABORATORY DATA:  As noted previously, lipase 256, amylase 235, total  bilirubin 1.7, SGOT 66. CPK 113.  Hemoglobin 10 with white count 8.9,  platelets 250.  Troponin I was 0.02.   EKG showed normal sinus rhythm with no ischemic changes.   I have a preliminary report on her acute abdominal film that she had a fair  amount of rectal stool but they were otherwise normal.  Ultrasound has been  performed but report is pending and I have not yet viewed any of those  studies.   ASSESSMENT/PLAN:  1. Pancreatitis with uncertain cause at this point.  Certainly with the     elevated total bilirubin and SGOT, must consider gallstone pancreatitis     despite her history of cholecystectomy.  Again, check ultrasound.     Uncertain if chlorthalidone is a possible culprit.  Will check the     literature and also check her lipids in the morning.  Her calcium     currently is okay.  Will hold p.o. intake for bowel rest.  IV fluids and     pain control.  The patient is actually doing quite well now after a dose     of Nubain in the ER.  2. Hypertension.  Catapres patch.  3. Diabetes, sliding scale insulin.  4. History of deep venous thrombosis.  Heparin for deep venous thrombosis     prophylaxis.  5. Anemia.  Protonix 40 mg IV q.24h.  Iron studies and follow stools but I     suspect she is going to be heme-positive if she was disimpacting herself     with some bleeding yesterday.  Marcene Duos, M.D.    EMM/MEDQ  D:  12/02/2002  T:  12/02/2002  Job:  [id number]

## 2011-03-30 NOTE — H&P (Signed)
NAMEJESSLYNN, [name]              ACCOUNT NO.:  [id number]   MEDICAL RECORD NO.:  [id number]          PATIENT TYPE:  OBV   LOCATION:  1426                         FACILITY:  [facility]   PHYSICIAN:  [name], D.O. [facility]   DATE OF BIRTH:  Apr 03, 1933   DATE OF ADMISSION:  06/01/2006  DATE OF DISCHARGE:                                HISTORY & PHYSICAL   CHIEF COMPLAINT:  Chest pain.   PRIMARY CARE PHYSICIAN:  [name], M.D.   HISTORY OF PRESENT ILLNESS:  The patient is a 77 year old African American  female with past medical history of coronary artery disease, type 2  diabetes, who presents with substernal/epigastric pain.  The patient ate  half an apple between noon and 1 p.m. today. Shortly thereafter, the patient  experienced substernal/epigastric discomfort.  The patient tried various  maneuvers such as drinking water and burping but despite her attempts, the  patient did not get any relief of her symptoms.  Her chest pain was  associated with shortness of breath.  The patient works at [employer] and was concerned enough to return home.  The patient stated she  tried to vomit x2 but this also did not provide significant relief.  The  patient denies any radiation.  The symptoms are nonexertional.  There was no  diaphoresis.   Review of E-chart reveals she has a history of mild pancreatitis and also  osteoarthritis. She denies any history of taking NSAIDs, but does take  Tylenol with Codeine.   PAST MEDICAL HISTORY:  1.  History of coronary artery disease, status post stent in 2002.  2.  History of gastritis.  3.  History of mild pancreatitis.  4.  Type 2 diabetes, controlled.  5.  Hyperlipidemia, recently taken off her statin due to concern of possible      myalgia.  6.  Osteoarthritis.  7.  Status post cholecystectomy in 1988.  8.  Tubal ligation.  9.  Status post hemorrhoidectomy.  10. Anemia.   SOCIAL HISTORY:  The patient is married for the last 49  years.  Accompanied  by her husband.  Does not report any alcohol or tobacco abuse.  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   FAMILY HISTORY:  Positive for diabetes, heart disease, and liver cancer.   LABORATORY DATA:  CBC showed WBC 6.8, hemoglobin 10.2, hematocrit 31.9,  platelets 253.  Complete metabolic panel showed sodium 138, potassium 4.1,  chloride 106, CO2 24, BUN 24, creatinine 1, blood sugar of 104.  The  patient's AST was elevated at 80, ALT at 85.  INR 1.  Elevated D-dimer at  1.31.   RADIOGRAPHIC STUDIES:  The patient had a CT scan of her chest with PE  protocol due to elevated D-dimer, and no pulmonary embolism was found.  Troponin was negative x3.   REVIEW OF SYSTEMS:  As noted above no dysuria, frequency, urgency. Chronic  constipation.  All other systems negative.   MEDICATIONS:  Not all doses are available. Aspirin, iron, labetalol,  lisinopril, metformin, stool softener.   ALLERGIES:  ASPIRIN and  PENICILLIN.   EKG:  EKG was performed in the ER which showed mild sinus bradycardia at 58  beats per minute.  Otherwise normal.  No acute  ST changes are noted.  The  patient had normal R wave progression.   PHYSICAL EXAMINATION:  VITAL SIGNS:  Temperature 98.5.  The patient's blood  pressure on initial ER evaluation was 199/89.  Started nitroglycerin drip  and she dropped to 143/82, pulse 61, respirations 24.  The patient is 100%  on room air.  GENERAL:  The patient is a very pleasant 77 year old African American female  in no apparent distress who appears younger than her stated age.  HEENT:  Normocephalic, atraumatic.  Pupils are equal and reactive to light  bilaterally.  Extraocular motility was intact.  The patient was anicteric.  Conjunctivae was within normal limits.  Oropharyngeal exam revealed upper  and lower dental plates; otherwise unremarkable.  Mucous membranes were  moist.  NECK:  Supple.  No carotid bruit, no thyromegaly.   LUNGS:  Normal respiratory effort.  Chest was clear to auscultation  bilaterally.  No rales or wheezing.  CARDIOVASCULAR:  Regular rate and rhythm.  No significant murmurs, rubs or  gallops appreciated.  ABDOMEN:  Soft, nontender.  Positive bowel sounds.  No organomegaly.  The  patient had right upper quadrant scar.  EXTREMITIES:  No edema.  Positive pedal dorsalis pulses.  NEUROLOGIC:  Cranial nerves II-XII grossly intact.  She was nonfocal.   IMPRESSION/RECOMMENDATIONS:  1.  Atypical chest pain.  2.  Type 2 diabetes.  3.  History of mild pancreatitis.  4.  Abnormal liver enzymes.  5.  Hypertension.  6.  History of anemia.  7.  History of hyperlipidemia.  8.  Osteoarthritis.   RECOMMENDATIONS:  I doubt symptoms are cardiac despite the patient having  multiple risk factors.  We will observe the patient overnight, check an  amylase and lipase, especially with mildly elevated liver enzymes.  Her  bilirubin, however, is normal.  We will empirically start Protonix 40 mg  p.o. b.i.d. and will consider stress testing as an outpatient.  The patient  will be hydrated with half normal saline and check a BMET in the morning.      [name], D.O. [facility]  Electronically Signed     RY/MEDQ  D:  06/02/2006  T:  06/02/2006  Job:  [id number]   cc:   [name], M.D. [facility]  [address]  [place]  [place] [address]

## 2011-03-30 NOTE — Discharge Summary (Signed)
NAME:  [name], [name]                        ACCOUNT NO.:  [id number]   MEDICAL RECORD NO.:  [id number]                   PATIENT TYPE:  INP   LOCATION:  5151                                 FACILITY:  [facility]   PHYSICIAN:  Lilla Shook, M.D.            DATE OF BIRTH:  November 25, 1932   DATE OF ADMISSION:  12/02/2002  DATE OF DISCHARGE:  12/06/2002                                 DISCHARGE SUMMARY   ADMISSION DIAGNOSES:  1. Gallstone pancreatitis versus __________  to medication.  Most likely the     latter.  (She was on chlorthalidone).  2. Hypotension.  3. Diabetes mellitus, type 2.  4. History of deep venous thrombosis.  5. Severe anemia.   DISCHARGE MEDICATIONS:  1. Caltrate daily.  2. Zocor 40 mg one half pill daily.  3. Glyburide 5 mg daily.  4. Tylenol p.r.n.  She was not to take chlorthalidone.  5. MiraLax p.r.n. constipation.   HISTORY OF PRESENT ILLNESS:  [name] is a 77 year old woman with a  history of hypertension and type 2 diabetes and coronary artery disease who  presented with awakening at 4:00 a.m. on the day of admission with  indigestion-like pain in her epigastrium.  She was nauseated, had emesis x1  that was bilious.  She denied any radiation of pain.  No diarrhea; (she had  chronic constipation) and digitally disimpacted herself the evening prior.  No melena.  She had eaten out at a restaurant prior.  She had a  cholecystectomy in 1988 and a similar episode to this one last summer.   PHYSICAL EXAMINATION:  Significant data:  She was afebrile.  Blood pressure  was 66/73, pulse of 81 and a moderately tender abdomen on exam without  rebound or peritoneal signs.  She also had a 2/6 systolic ejection murmur at  the left sternal border radiating to the right second intercostal space.   LABORATORY DATA:  Amylase was 235.  Lipase 256 with normal white blood cell  count.  Troponin was normal as was EKG.   HOSPITAL COURSE:  #1.  Pancreatitis.  The cause  was unknown except for  possible chlorthalidone.  Dr. Matthias Hughs had been consulted.  He recommended  that she follow a low fat diet, MiraLax for constipation, have increased  labs and also should be considered for outpatient screening colonoscopy and  gave her the option of an ERCP.  Her laboratory abnormalities normalized.  A  CT of her abdomen and pelvis showed only increased lymph node size in the  bilateral inguinal region but no pancreatitis.  Hep B and C serologies as  well as CA 19-9 were negative.  MRCP showed a dilated common bile duct  without strictures or stones and there was no evidence of ongoing underlying  GI tract disease.  #2.  Anemia.  Her hemoglobin was noted to shift down slowly from 10 to 7.8.  Iron studies were okay, without  evidence of bleeding.  She was transfused  two units of packed red blood cells with an appropriate response of her  hemoglobin to 10.3.  #3.  Diabetes mellitus, type 2.  ABG's remained good while in the hospital.  #4.  Hypertension.  Initially she had been off her Catapres patch.  When  this was replaced she felt much better.  #5.  Back pain.  She had a history of recently diagnosed polymyalgia  rheumatica and was about to be started on prednisone prior to this episode  of pancreatitis.  She was given some Dilaudid for relief and also ambulated  and she did much better.   CONSULTATIONS:  Dr. Molly Maduro Buccini.   CONDITION ON DISCHARGE:  Stable and improved.   DISCHARGE INSTRUCTIONS:  [name] was to continue all of her outpatient  medications except for her chlorthalidone.  She was given a few Tylenol #3  pills for her back pain, to take as directed.   FOLLOW UP:  She is to follow up in one week with me in the office.                                               Lilla Shook, M.D.    SEJ/MEDQ  D:  01/28/2003  T:  01/29/2003  Job:  [id number]

## 2011-03-30 NOTE — Assessment & Plan Note (Signed)
[facility]                             PRIMARY CARE OFFICE NOTE   NAME:[name], [name]                     MRN:          [id number]  DATE:05/28/2006                            DOB:          [birth year]    PROCEDURE:  Injection of the right pes anserinus.  Risks including bleeding,  infection, unsuccessful procedure, skin atrophy, and others as well as  benefits were explained to patient in detail.  He agreed to proceed.  The  area was identified below right knee and treated with Betadine, alcohol, 40  mg of Depo-Medrol, and 2 mL of 2% lidocaine was injected in fan-like  fashion.  Tolerate well.  Complications:  None.  Excellent pain relief  immediately following the procedure.   Left knee pes anserinus injection:  The injection was done in the identical  fashion.  Tolerated well.  Complications:  None.  Excellent pain relief  immediately following the procedure.   Injection left fibular head bursitis:  Indication:  Painful bursitis.  The  risks and benefits were explained to the patient in detail.  She agreed to  proceed.  The area was prepped with Betadine, alcohol, and injected in fan-  like fashion with 40 mg Depo-Medrol and 1 mL of 2% lidocaine.  Tolerated  well.  Complications:  None.  Excellent pain relief immediately following  the procedure.                                   [name], MD   AP/MedQ  DD:  05/28/2006  DT:  05/29/2006  Job #:  [id number]

## 2011-03-30 NOTE — Discharge Summary (Signed)
[name], [name]              ACCOUNT NO.:  [id number]   MEDICAL RECORD NO.:  [id number]          PATIENT TYPE:  INP   LOCATION:  1426                         [facility]ACILITY:  [facility]   PHYSICIAN:  Rosalyn Gess. Norins, M.D. LHCDATE OF BIRTH:  1933-07-09   DATE OF ADMISSION:  06/01/2006  DATE OF DISCHARGE:  06/03/2006                                 DISCHARGE SUMMARY   ADMITTING DIAGNOSIS:  Chest pain.   DISCHARGE DIAGNOSIS:  Pancreatitis, pain resolved.   HISTORY OF PRESENT ILLNESS:  The patient is 77 year old African-American  woman with a past medical history of CAD, type 2 diabetes who presented with  substernal and epigastric pain.  She had eaten an apple shortly before  admission and then experienced significant discomfort.  Her pain was  associated with shortness of breath.  The patient reports she returned home  from work at [employer] but then she developed nausea and vomiting  presented to the emergency department.   Please see H&P for past medical history, family history and social history.   PROCEDURES:  1.  The patient had chest x-ray admission, which was unremarkable.  2.  CT angiogram of the chest July 21 which showed no evidence of pulmonary      emboli, did show cardiomegaly, did show bibasilar dependent atelectasis.  3.  Ultrasound of the abdomen which showed no acute abnormality status post      cholecystectomy with stable mildly prominent common bile duct.   HOSPITAL COURSE:  The patient was admitted to telemetry unit.  She did have  cardiac enzymes cycled which were negative x3, with negative troponins x3.  EKG was unremarkable.  The patient was considered ruled out for cardiac  related disease.  The patient was considered at this point to have a GI  etiology of her pain and discomfort.  She had an amylase that was mildly  elevated at 133 on July 21, a lipase that was mildly elevated 55.  Working  diagnosis was pancreatitis.   The patient did make   relatively rapid improvement with resolution of her  pain and discomfort.  She had follow-up lipase on July 23 which was normal  at 15.  Repeat amylase July 23 was normal at 71.  With the patient having  ruled out for cardiac disease, with her pancreatic enzymes returned to  normal with being able to take a diet, she was felt to be stable and ready  for discharge home.   Diabetes.  The patient is followed for diabetes.  She did have a hemoglobin  A1c of 5.6% which was normal..   DISCHARGE EXAM:  Temperature 97.2, blood pressure 187/85, pulse 56,  respirations 16.  GENERAL APPEARANCE:  Pleasant woman lying in bed in no acute distress.  ABDOMEN: She had positive bowel sounds with no tenderness to palpation.   DISCHARGE MEDICATIONS:  The patient is to resume all her home medications  including iron, aspirin, labetalol, lisinopril, metformin and stool  softener.   The patient is to see her primary care physician for follow-up in 10-14  days.   The patient's condition at time  of discharge dictation is stable and  improved.           ______________________________  Rosalyn Gess Norins, M.D. [facility]     MEN/MEDQ  D:  06/03/2006  T:  06/04/2006  Job:  [id number]

## 2011-03-30 NOTE — Consult Note (Signed)
NAME:  Shelby, Jefferson NO.:  [id number]   MEDICAL RECORD NO.:  [id number]                   PATIENT TYPE:  INP   LOCATION:  6705                                 FACILITY:  [facility]   PHYSICIAN:  Bernette Redbird, M.D.                DATE OF BIRTH:  1933-07-24   DATE OF CONSULTATION:  12/02/2002  DATE OF DISCHARGE:                                   CONSULTATION   REASON FOR CONSULTATION:  Dr. Marcene Duos, covering for Dr.  Lovell Sheehan, asked me to see this 77 year old African American female because of  apparent acute pancreatitis.   HISTORY OF PRESENT ILLNESS:  The patient has no prior history of  pancreatitis but did have her gallbladder out approximately 15 years ago.   With that background, she was in her usual state of health, without  prodromal symptoms, and felt well when she went to bed last night but  awakened with a lot of gas pain, upper abdominal pain, and one episode of  emesis (nonbloody) after which she felt somewhat better.  She was seen in  the emergency room where labs were noted to be elevated with a lipase of 256  and an amylase of 235.  There was also minimal elevation of total bilirubin  and SGOT.  An ultrasound was obtained which showed a dilated CBD but, by  report, no other specific abnormalities were appreciated.   The patient is a nondrinker.  She is on chlorthalidone which is reported to  be able to cause pancreatitis.  Her lipid status is unknown.   ALLERGIES:  She has an allergy to PENICILLIN (rash) and to ASPIRIN (GI  upset).   MEDICATIONS:  Her outpatient medications include Chlorthalidone, Zocor,  Tylenol, Caltrate, Glyburide.   PAST SURGICAL HISTORY:  This includes the previous cholecystectomy, also  hysterectomy.  She has had a PTCA with stent several years ago by Dr. Corliss Marcus.   PAST MEDICAL HISTORY:  This includes coronary disease, status post stent  placement with apparently history of a mild  subclinical MI noted apparently  on EKG at the time of her stent placement.  No COPD.  She was diagnosed with  diabetes about a year ago and has hypertension.   HABITS:  She is a lifelong nonsmoker, lifelong nondrinker.   FAMILY HISTORY:  This is negative for GI tract illnesses such as colon  cancer or gallbladder disease.   SOCIAL HISTORY:  The patient lives at home with her husband.   REVIEW OF SYSTEMS:  This is pertinent for the absence of upper tract  symptoms prior to admission, such as loss of appetite, loss of weight,  abdominal pain, nausea, etc. She does run chronically constipated and  apparently uses digital impaction to help keep things moving.  She states  she had a colonoscopy by Dr. Juanda Chance about five to 10 years ago.  She does  see occasional  blood.   LABORATORY DATA:  White count 8900, hemoglobin 10.0 with an MCV of 86 and  RDW of 12.7, platelets 250,000.  Differential shows 75 polys and 17 lymphs.  Chemistry panel shows BUN 24, creatinine 1.1, bilirubin 1.7, alkaline  phosphatase normal at 117, SGOT mildly elevated at 66 (normal up to 37),  SGPT normal at 20, albumin 3.6, calcium normal 9.6, amylase 235 (normal up  to 131), lipase 256 (normal up to 51).  Troponin I negative, CK normal.   Ultrasound showed dilated bile duct by report.   CT scan (reviewed at length with radiologist) shows no obvious pancreatitis,  but substantial dilatation of the CBD which has a maximum diameter of about  15 mm.  No obvious hepatic parenchymal abnormalities but a hint of mild  intrahepatic biliary ductal dilatation.  No evidence of any pancreatic mass.   IMPRESSION:  1. Abdominal pain, elevation of amylase and lipase but without radiographic     abnormalities of the pancreas, all compatible with mild pancreatitis     considering the normal white count and the fact that the patient's pain     is now pretty much resolved.  2. Elevated total bilirubin which could reflect common bile  duct pathology     or simply Gilbert's syndrome.  3. Elevation of SGOT which could be due to fatty infiltration of the liver     or her Zocor medication or, again, could be reflective of common duct     pathology.  4. Substantial common bile duct dilatation which could be physiologic post     cholecystectomy dilatation (more than typical) or reflective of     obstruction of the distal duct, either due to benign causes such as     papillary stenosis or malignancy such as cholangiocarcinoma or pancreatic     cancer.  5. Normocytic anemia.  6. Chronic constipation.   DISCUSSION:  I do believe the patient has pancreatitis based on the abrupt  onset of symptoms and the elevation of both amylase and lipase.  The  etiology of the pancreatitis is unclear.  I wonder whether it could be due  to chlorthalidone, or conceivably due to elevated lipids, a common duct  stone, or a small pancreatic tumor which was not visualized on the scan.  One thing that would go against a common duct stone in the setting is the  absence of the substantial transaminase elevation and usually rather  dramatic pancreatic enzyme elevation which is typically seen with common  duct stones in my experience.   RECOMMENDATIONS:  1. Follow labs to resolution and start low fat diet when they have     normalized.  2. Concerning the etiology of the pancreatitis, I would check a direct     bilirubin (to see if the elevation of total bilirubin is due to     Gilbert's), a lipid panel to see if that might account for the     pancreatitis, and a CA19-9 level to screen for pancreatic malignancy.  3. I have discussed the option of ERCP to help clarify the issue and check     for common duct stone or a pancreatic or ampullary or bile duct tumor.     This was discussed both with the patient and her daughter.  They    understands there are risks associated with the test but there is also     some degree of risk associated with not  doing the test such as missing an  early diagnosis or having a recurrent episode of pancreatitis from a     common duct stone which might be more severe the next time around.  We     will continue to give this some thought and we will be guided by the     patient's clinical evolution and the results of her lab tests.  4. MiraLax for constipation (I think a trial of this would be worthwhile but     the patient is not sure that she could afford it on an ongoing basis as     an outpatient).  5. The patient may be a candidate for elective outpatient colon cancer     screening and colonoscopy, although if she     has had an adequate colonoscopy within the past 10 years, technically she     is up to date on the screening in the absence of a family history.   I appreciate the opportunity to have seen this patient in consultation with  you.  I will follow her with you.                                                Bernette Redbird, M.D.    RB/MEDQ  D:  12/02/2002  T:  12/03/2002  Job:  [id number]

## 2011-05-09 ENCOUNTER — Other Ambulatory Visit (INDEPENDENT_AMBULATORY_CARE_PROVIDER_SITE_OTHER): Payer: No Typology Code available for payment source

## 2011-05-09 DIAGNOSIS — E119 Type 2 diabetes mellitus without complications: Secondary | ICD-10-CM

## 2011-05-09 LAB — BASIC METABOLIC PANEL
Calcium: 9.2 mg/dL (ref 8.4–10.5)
Chloride: 109 mEq/L (ref 96–112)
Creatinine, Ser: 0.9 mg/dL (ref 0.4–1.2)
GFR: 82.49 mL/min (ref >60.00)

## 2011-05-09 LAB — HEMOGLOBIN A1C: Hgb A1c MFr Bld: 6.4 % (ref 4.6–6.5)

## 2011-05-10 ENCOUNTER — Encounter: Payer: Self-pay | Admitting: Internal Medicine

## 2011-05-11 ENCOUNTER — Ambulatory Visit (INDEPENDENT_AMBULATORY_CARE_PROVIDER_SITE_OTHER): Payer: No Typology Code available for payment source | Admitting: Internal Medicine

## 2011-05-11 ENCOUNTER — Telehealth: Payer: Self-pay | Admitting: *Deleted

## 2011-05-11 ENCOUNTER — Other Ambulatory Visit: Payer: Self-pay | Admitting: Internal Medicine

## 2011-05-11 ENCOUNTER — Encounter: Payer: Self-pay | Admitting: Internal Medicine

## 2011-05-11 DIAGNOSIS — E538 Deficiency of other specified B group vitamins: Secondary | ICD-10-CM

## 2011-05-11 DIAGNOSIS — E559 Vitamin D deficiency, unspecified: Secondary | ICD-10-CM

## 2011-05-11 DIAGNOSIS — R5383 Other fatigue: Secondary | ICD-10-CM | POA: Insufficient documentation

## 2011-05-11 DIAGNOSIS — M545 Low back pain, unspecified: Secondary | ICD-10-CM

## 2011-05-11 DIAGNOSIS — I2510 Atherosclerotic heart disease of native coronary artery without angina pectoris: Secondary | ICD-10-CM

## 2011-05-11 DIAGNOSIS — F3289 Other specified depressive episodes: Secondary | ICD-10-CM

## 2011-05-11 DIAGNOSIS — E119 Type 2 diabetes mellitus without complications: Secondary | ICD-10-CM

## 2011-05-11 DIAGNOSIS — R5381 Other malaise: Secondary | ICD-10-CM

## 2011-05-11 DIAGNOSIS — F329 Major depressive disorder, single episode, unspecified: Secondary | ICD-10-CM

## 2011-05-11 MED ORDER — LOSARTAN POTASSIUM-HCTZ 100-25 MG PO TABS: 1.0000 | ORAL_TABLET | Freq: Every day | ORAL | Status: DC

## 2011-05-11 MED ORDER — VITAMIN D3 25 MCG (1000 UNIT) PO TABS: 1000.0000 [IU] | ORAL_TABLET | Freq: Every day | ORAL | Status: DC

## 2011-05-11 MED ORDER — MELOXICAM 7.5 MG PO TABS: 7.5000 mg | ORAL_TABLET | Freq: Every day | ORAL | Status: DC
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
## 2011-05-11 NOTE — Telephone Encounter (Signed)
Pt states she cant take new pain med she was given at [facility] today because she allergic to ASA and pharmacist told her not to take it. Please advise

## 2011-05-11 NOTE — Assessment & Plan Note (Signed)
On Rx Discussed

## 2011-05-11 NOTE — Assessment & Plan Note (Signed)
Meloxicam to try

## 2011-05-11 NOTE — Assessment & Plan Note (Signed)
On Rx 

## 2011-05-11 NOTE — Telephone Encounter (Signed)
Spoke to pharmacy. Med in question is Mobic. Gave the mess to Dr. Posey Rea. He states if pt was able to take the Vimovo samples that he gave her she should be ok to try Mobic.   Called pt to advise of above no answer. Will try again later.

## 2011-05-11 NOTE — Assessment & Plan Note (Signed)
To ER if problems On Rx

## 2011-05-18 NOTE — Telephone Encounter (Signed)
Tried to call pt to inform of below. No answer at above listed numbers. Will notify pharmacy...Marland KitchenMarland KitchenMarland Kitchen Spoke to Autumn at [facility]...she states pt did fill rx for mobic.  Closing phone note.

## 2011-06-11 ENCOUNTER — Telehealth: Payer: Self-pay

## 2011-06-11 NOTE — Telephone Encounter (Signed)
Due to reaction to several other medications may not be able to do cheaper. REecommend [facility] to discuss meds

## 2011-06-11 NOTE — Telephone Encounter (Signed)
Patient called stating that she is not able to afford the 15$ copay for losartan. She would like rx for something cheaper. Please advise Thanks

## 2011-06-12 NOTE — Telephone Encounter (Signed)
Patient notified per MD and stated that she did not want to come into the office just to discuss medication. Per patient, she will try to see if she can get the money from someone but would like for Dr Macario Golds to review message for additional advisement when he returns.

## 2011-06-13 MED ORDER — LISINOPRIL 10 MG PO TABS: 10.0000 mg | ORAL_TABLET | Freq: Every day | ORAL | Status: DC

## 2011-06-13 NOTE — Telephone Encounter (Signed)
Patient notified

## 2011-06-13 NOTE — Telephone Encounter (Signed)
We can try Lisinopril 10 mg qd, however, she was c/o tiredness before on a similar med. Thx

## 2011-06-14 ENCOUNTER — Other Ambulatory Visit: Payer: Self-pay | Admitting: Internal Medicine

## 2011-07-03 ENCOUNTER — Telehealth: Payer: Self-pay | Admitting: *Deleted

## 2011-07-03 NOTE — Telephone Encounter (Signed)
Pt went to pharm and she states she cannot take aspirin because it causes her heart to "flood", she was told meloxicam has aspirin in it and would like to know if it is safe for her to take this medicaiton. Please Advise

## 2011-07-03 NOTE — Telephone Encounter (Signed)
As needed 2-3 tabs/week should be safe. Thx

## 2011-07-04 NOTE — Telephone Encounter (Signed)
Pt informed

## 2011-07-15 ENCOUNTER — Emergency Department (HOSPITAL_COMMUNITY): Payer: No Typology Code available for payment source

## 2011-07-15 ENCOUNTER — Emergency Department (HOSPITAL_COMMUNITY)
Admission: EM | Admit: 2011-07-15 | Discharge: 2011-07-15 | Disposition: A | Discharge status: Home / Self Care | Payer: No Typology Code available for payment source | Attending: Emergency Medicine | Admitting: Emergency Medicine

## 2011-07-15 DIAGNOSIS — Y92009 Unspecified place in unspecified non-institutional (private) residence as the place of occurrence of the external cause: Secondary | ICD-10-CM | POA: Insufficient documentation

## 2011-07-15 DIAGNOSIS — M25519 Pain in unspecified shoulder: Secondary | ICD-10-CM | POA: Insufficient documentation

## 2011-07-15 DIAGNOSIS — I10 Essential (primary) hypertension: Secondary | ICD-10-CM | POA: Insufficient documentation

## 2011-07-15 DIAGNOSIS — T07XXXA Unspecified multiple injuries, initial encounter: Secondary | ICD-10-CM | POA: Insufficient documentation

## 2011-07-15 DIAGNOSIS — E119 Type 2 diabetes mellitus without complications: Secondary | ICD-10-CM | POA: Insufficient documentation

## 2011-07-15 DIAGNOSIS — W010XXA Fall on same level from slipping, tripping and stumbling without subsequent striking against object, initial encounter: Secondary | ICD-10-CM | POA: Insufficient documentation

## 2011-07-15 DIAGNOSIS — M25569 Pain in unspecified knee: Secondary | ICD-10-CM | POA: Insufficient documentation

## 2011-07-24 ENCOUNTER — Other Ambulatory Visit: Payer: Self-pay | Admitting: Internal Medicine

## 2011-08-02 LAB — BASIC METABOLIC PANEL
BUN: 36 — ABNORMAL HIGH
BUN: 37 — ABNORMAL HIGH
BUN: 37 — ABNORMAL HIGH
CO2: 20
CO2: 20
CO2: 21
CO2: 21
Calcium: 9
Calcium: 9.3
Calcium: 9.4
Chloride: 109
Chloride: 111
Creatinine, Ser: 1.23 — ABNORMAL HIGH
Creatinine, Ser: 1.24 — ABNORMAL HIGH
GFR calc Af Amer: 52 — ABNORMAL LOW
GFR calc Af Amer: 54 — ABNORMAL LOW
GFR calc non Af Amer: 40 — ABNORMAL LOW
GFR calc non Af Amer: 44 — ABNORMAL LOW
GFR calc non Af Amer: 45 — ABNORMAL LOW
Glucose, Bld: 56 — ABNORMAL LOW
Glucose, Bld: 93
Glucose, Bld: 97
Potassium: 4.9
Potassium: 5
Sodium: 136
Sodium: 139

## 2011-08-02 LAB — CK TOTAL AND CKMB (NOT AT ARMC)
CK, MB: 1
Relative Index: 1
Relative Index: INVALID
Total CK: 104
Total CK: 64

## 2011-08-02 LAB — CARDIAC PANEL(CRET KIN+CKTOT+MB+TROPI)
CK, MB: 1.4
Total CK: 62
Troponin I: 0.07 — ABNORMAL HIGH

## 2011-08-02 LAB — URINALYSIS, ROUTINE W REFLEX MICROSCOPIC
Bilirubin Urine: NEGATIVE
Nitrite: POSITIVE — AB
Specific Gravity, Urine: 1.016
pH: 5.5

## 2011-08-02 LAB — CROSSMATCH
ABO/RH(D): B POS
Antibody Screen: POSITIVE
DAT, IgG: NEGATIVE
Donor AG Type: NEGATIVE

## 2011-08-02 LAB — URINE CULTURE

## 2011-08-02 LAB — FOLATE: Folate: 18.3

## 2011-08-02 LAB — DIFFERENTIAL
Basophils Absolute: 0.1
Basophils Relative: 2 — ABNORMAL HIGH
Eosinophils Absolute: 0
Monocytes Relative: 6
Neutrophils Relative %: 81 — ABNORMAL HIGH

## 2011-08-02 LAB — FERRITIN: Ferritin: 313 — ABNORMAL HIGH (ref 10–291)

## 2011-08-02 LAB — CBC
HCT: 27.2 — ABNORMAL LOW
Hemoglobin: 9.1 — ABNORMAL LOW
MCHC: 33.9
MCV: 87
MCV: 88.3
Platelets: 145 — ABNORMAL LOW
Platelets: 187
RDW: 12.9
RDW: 13
RDW: 13.2

## 2011-08-02 LAB — URINE MICROSCOPIC-ADD ON

## 2011-08-02 LAB — HEMOGLOBIN AND HEMATOCRIT, BLOOD: Hemoglobin: 8.4 — ABNORMAL LOW

## 2011-08-02 LAB — IRON AND TIBC: TIBC: 274

## 2011-08-02 LAB — POCT CARDIAC MARKERS
Myoglobin, poc: 111
Myoglobin, poc: 96.7
Operator id: 3206

## 2011-08-02 LAB — RETICULOCYTES
Retic Count, Absolute: 13.6 — ABNORMAL LOW
Retic Ct Pct: 0.4

## 2011-08-13 ENCOUNTER — Ambulatory Visit: Payer: No Typology Code available for payment source | Admitting: Internal Medicine

## 2011-08-13 DIAGNOSIS — Z0289 Encounter for other administrative examinations: Secondary | ICD-10-CM

## 2011-08-20 ENCOUNTER — Other Ambulatory Visit: Payer: Self-pay | Admitting: Internal Medicine

## 2011-08-21 ENCOUNTER — Encounter: Payer: Self-pay | Admitting: Internal Medicine

## 2011-08-21 ENCOUNTER — Ambulatory Visit (INDEPENDENT_AMBULATORY_CARE_PROVIDER_SITE_OTHER): Payer: No Typology Code available for payment source | Admitting: Internal Medicine

## 2011-08-21 ENCOUNTER — Other Ambulatory Visit (INDEPENDENT_AMBULATORY_CARE_PROVIDER_SITE_OTHER): Payer: No Typology Code available for payment source

## 2011-08-21 VITALS — BP 160/80 | HR 92 | Temp 98.7°F | Resp 16 | Wt 178.0 lb

## 2011-08-21 DIAGNOSIS — E119 Type 2 diabetes mellitus without complications: Secondary | ICD-10-CM

## 2011-08-21 DIAGNOSIS — R42 Dizziness and giddiness: Secondary | ICD-10-CM

## 2011-08-21 DIAGNOSIS — I10 Essential (primary) hypertension: Secondary | ICD-10-CM

## 2011-08-21 DIAGNOSIS — M545 Low back pain: Secondary | ICD-10-CM

## 2011-08-21 DIAGNOSIS — E538 Deficiency of other specified B group vitamins: Secondary | ICD-10-CM

## 2011-08-21 DIAGNOSIS — Z23 Encounter for immunization: Secondary | ICD-10-CM

## 2011-08-21 LAB — CBC WITH DIFFERENTIAL/PLATELET
Basophils Absolute: 0 10*3/uL (ref 0.0–0.1)
HCT: 31.8 % — ABNORMAL LOW (ref 36.0–46.0)
Hemoglobin: 10.2 g/dL — ABNORMAL LOW (ref 12.0–15.0)
Lymphs Abs: 1.1 10*3/uL (ref 0.7–4.0)
MCHC: 32.2 g/dL (ref 30.0–36.0)
MCV: 90.3 fl (ref 78.0–100.0)
Monocytes Absolute: 0.4 10*3/uL (ref 0.1–1.0)
Neutro Abs: 3.2 10*3/uL (ref 1.4–7.7)
Platelets: 189 10*3/uL (ref 150.0–400.0)
RDW: 13.3 % (ref 11.5–14.6)

## 2011-08-21 LAB — BASIC METABOLIC PANEL
Calcium: 9.6 mg/dL (ref 8.4–10.5)
Creatinine, Ser: 1.3 mg/dL — ABNORMAL HIGH (ref 0.4–1.2)
GFR: 52.57 mL/min — ABNORMAL LOW (ref >60.00)
Sodium: 140 mEq/L (ref 135–145)

## 2011-08-21 LAB — URINALYSIS
Bilirubin Urine: NEGATIVE
Hgb urine dipstick: NEGATIVE
Nitrite: NEGATIVE
Total Protein, Urine: NEGATIVE
Urine Glucose: NEGATIVE
Urobilinogen, UA: 0.2 (ref 0.0–1.0)

## 2011-08-21 LAB — HEPATIC FUNCTION PANEL
ALT: 9 U/L (ref 0–35)
Albumin: 4.7 g/dL (ref 3.5–5.2)
Alkaline Phosphatase: 74 U/L (ref 39–117)
Bilirubin, Direct: 0.1 mg/dL (ref 0.0–0.3)
Total Protein: 9.1 g/dL — ABNORMAL HIGH (ref 6.0–8.3)

## 2011-08-21 LAB — TSH: TSH: 1.12 u[IU]/mL (ref 0.35–5.50)

## 2011-08-21 MED ORDER — ASPIRIN EC 325 MG PO TBEC: 325.0000 mg | DELAYED_RELEASE_TABLET | Freq: Every day | ORAL | Status: DC

## 2011-08-21 MED ORDER — CITALOPRAM HYDROBROMIDE 10 MG PO TABS: 10.0000 mg | ORAL_TABLET | Freq: Every day | ORAL | Status: DC

## 2011-08-21 MED ORDER — LISINOPRIL 10 MG PO TABS: 10.0000 mg | ORAL_TABLET | Freq: Two times a day (BID) | ORAL | Status: DC

## 2011-08-21 NOTE — Assessment & Plan Note (Signed)
Continue with current prescription therapy as reflected on the Med list.  

## 2011-08-21 NOTE — Progress Notes (Signed)
  Subjective:    Patient ID: [name], female    DOB: [birth year], 77 y.o.   MRN: [id number]  HPI  C/o dizziness and once lost vision. C/o bladder incontinence. The patient presents for a follow-up of  chronic hypertension, chronic dyslipidemia, type 2 diabetes controlled with medicines C/o pain in joints and LBP 9/10    Review of Systems  Constitutional: Positive for fatigue. Negative for chills, activity change, appetite change and unexpected weight change.  HENT: Negative for congestion, mouth sores and sinus pressure.   Eyes: Negative for visual disturbance.  Respiratory: Negative for cough and chest tightness.   Gastrointestinal: Negative for nausea and abdominal pain.  Genitourinary: Negative for frequency, difficulty urinating and vaginal pain.  Musculoskeletal: Negative for back pain and gait problem.  Skin: Negative for pallor and rash.  Neurological: Negative for dizziness, tremors, weakness, numbness and headaches.  Psychiatric/Behavioral: Positive for sleep disturbance. Negative for suicidal ideas and confusion. The patient is nervous/anxious.        Objective:   Physical Exam  Constitutional: She appears well-developed. No distress.       Obese; very slow  HENT:  Head: Normocephalic.  Right Ear: External ear normal.  Left Ear: External ear normal.  Nose: Nose normal.  Mouth/Throat: Oropharynx is clear and moist.  Eyes: Conjunctivae are normal. Pupils are equal, round, and reactive to light. Right eye exhibits no discharge. Left eye exhibits no discharge.  Neck: Normal range of motion. Neck supple. No JVD present. No tracheal deviation present. No thyromegaly present.  Cardiovascular: Normal rate, regular rhythm and normal heart sounds.   Pulmonary/Chest: No stridor. No respiratory distress. She has no wheezes.  Abdominal: Soft. Bowel sounds are normal. She exhibits no distension and no mass. There is no tenderness. There is no rebound and no guarding.    Musculoskeletal: She exhibits edema (trace) and tenderness.  Lymphadenopathy:    She has no cervical adenopathy.  Neurological: She displays normal reflexes. No cranial nerve deficit. She exhibits normal muscle tone. Coordination normal.  Skin: No rash noted. No erythema.  Psychiatric: Her behavior is normal. Judgment and thought content normal.       depressed    BP Readings from Last 3 Encounters:  08/21/11 160/80  05/11/11 150/80  01/10/11 160/80         Assessment & Plan:   A complex case

## 2011-08-21 NOTE — Assessment & Plan Note (Signed)
Continue with current prescription therapy as reflected on the Med list. BP Readings from Last 3 Encounters:  08/21/11 160/80  05/11/11 150/80  01/10/11 160/80

## 2011-08-24 LAB — CBC
HCT: 27.5 — ABNORMAL LOW
HCT: 32.8 — ABNORMAL LOW
Hemoglobin: 9 — ABNORMAL LOW
Hemoglobin: 9.3 — ABNORMAL LOW
MCHC: 33.4
MCHC: 33.7
MCHC: 33.7
MCV: 88.1
MCV: 88.3
Platelets: 136 — ABNORMAL LOW
Platelets: 155
Platelets: 168
RBC: 3.02 — ABNORMAL LOW
RDW: 12.8
RDW: 12.9
RDW: 12.9
WBC: 4.4
WBC: 4.7
WBC: 5.3

## 2011-08-24 LAB — HEMOGLOBIN A1C: Hgb A1c MFr Bld: 5.9

## 2011-08-24 LAB — BASIC METABOLIC PANEL
BUN: 19
CO2: 26
Chloride: 106
Creatinine, Ser: 0.27 — ABNORMAL LOW
GFR calc non Af Amer: 50 — ABNORMAL LOW
GFR calc non Af Amer: >60
Glucose, Bld: 91
Glucose, Bld: 123 — ABNORMAL HIGH
Potassium: 4.5
Potassium: 4.6
Sodium: 140

## 2011-08-24 LAB — TYPE AND SCREEN
Antibody Screen: POSITIVE
DAT, IgG: NEGATIVE
Donor AG Type: NEGATIVE

## 2011-08-24 LAB — CK TOTAL AND CKMB (NOT AT ARMC)
CK, MB: 20.6 — ABNORMAL HIGH
Relative Index: 8.5 — ABNORMAL HIGH
Relative Index: 13.9 — ABNORMAL HIGH
Total CK: 242 — ABNORMAL HIGH
Total CK: 308 — ABNORMAL HIGH
Total CK: 269 — ABNORMAL HIGH

## 2011-08-24 LAB — URINALYSIS, ROUTINE W REFLEX MICROSCOPIC
Ketones, ur: NEGATIVE
Leukocytes, UA: NEGATIVE
Nitrite: NEGATIVE
Protein, ur: 100 — AB
pH: 5.5

## 2011-08-24 LAB — LIPID PANEL
Cholesterol: 179
HDL: 66

## 2011-08-24 LAB — PROTIME-INR: Prothrombin Time: 14.5

## 2011-08-24 LAB — VITAMIN B12: Vitamin B-12: >2000 — ABNORMAL HIGH (ref 211–911)

## 2011-08-24 LAB — FERRITIN: Ferritin: 228 (ref 10–291)

## 2011-08-24 LAB — DIFFERENTIAL
Basophils Absolute: 0
Eosinophils Absolute: 0
Eosinophils Relative: 0
Lymphocytes Relative: 10 — ABNORMAL LOW

## 2011-08-24 LAB — HEPATIC FUNCTION PANEL
Alkaline Phosphatase: 75
Indirect Bilirubin: 0.6
Total Bilirubin: 0.7

## 2011-08-24 LAB — TROPONIN I
Troponin I: 6.94
Troponin I: 7.47
Troponin I: 3.35

## 2011-08-24 LAB — POCT CARDIAC MARKERS: Troponin i, poc: 3.55

## 2011-08-24 LAB — IRON: Iron: 29 — ABNORMAL LOW

## 2011-08-24 LAB — B-NATRIURETIC PEPTIDE (CONVERTED LAB): Pro B Natriuretic peptide (BNP): 462 — ABNORMAL HIGH

## 2011-08-24 LAB — HEPARIN LEVEL (UNFRACTIONATED): Heparin Unfractionated: 0.51

## 2011-08-28 ENCOUNTER — Other Ambulatory Visit: Payer: Self-pay | Admitting: Internal Medicine

## 2011-08-28 DIAGNOSIS — R42 Dizziness and giddiness: Secondary | ICD-10-CM

## 2011-08-29 ENCOUNTER — Encounter (INDEPENDENT_AMBULATORY_CARE_PROVIDER_SITE_OTHER): Payer: No Typology Code available for payment source | Admitting: Cardiology

## 2011-08-29 DIAGNOSIS — R42 Dizziness and giddiness: Secondary | ICD-10-CM

## 2011-08-29 DIAGNOSIS — H53129 Transient visual loss, unspecified eye: Secondary | ICD-10-CM

## 2011-08-29 DIAGNOSIS — I6529 Occlusion and stenosis of unspecified carotid artery: Secondary | ICD-10-CM

## 2011-09-04 ENCOUNTER — Ambulatory Visit (INDEPENDENT_AMBULATORY_CARE_PROVIDER_SITE_OTHER)
Admission: RE | Admit: 2011-09-04 | Discharge: 2011-09-04 | Disposition: A | Discharge status: Home / Self Care | Payer: No Typology Code available for payment source | Source: Ambulatory Visit | Attending: Internal Medicine | Admitting: Internal Medicine

## 2011-09-04 DIAGNOSIS — R42 Dizziness and giddiness: Secondary | ICD-10-CM

## 2011-09-07 ENCOUNTER — Emergency Department (HOSPITAL_COMMUNITY)
Admission: EM | Admit: 2011-09-07 | Discharge: 2011-09-07 | Disposition: A | Discharge status: Home / Self Care | Payer: No Typology Code available for payment source | Attending: Emergency Medicine | Admitting: Emergency Medicine

## 2011-09-07 DIAGNOSIS — Z79899 Other long term (current) drug therapy: Secondary | ICD-10-CM | POA: Insufficient documentation

## 2011-09-07 DIAGNOSIS — I10 Essential (primary) hypertension: Secondary | ICD-10-CM | POA: Insufficient documentation

## 2011-09-07 DIAGNOSIS — H81399 Other peripheral vertigo, unspecified ear: Secondary | ICD-10-CM | POA: Insufficient documentation

## 2011-09-07 DIAGNOSIS — Z7982 Long term (current) use of aspirin: Secondary | ICD-10-CM | POA: Insufficient documentation

## 2011-09-07 DIAGNOSIS — I252 Old myocardial infarction: Secondary | ICD-10-CM | POA: Insufficient documentation

## 2011-09-07 DIAGNOSIS — E119 Type 2 diabetes mellitus without complications: Secondary | ICD-10-CM | POA: Insufficient documentation

## 2011-09-07 DIAGNOSIS — H9319 Tinnitus, unspecified ear: Secondary | ICD-10-CM | POA: Insufficient documentation

## 2011-09-07 LAB — GLUCOSE, CAPILLARY: Glucose-Capillary: 85 mg/dL (ref 70–99)

## 2011-09-10 ENCOUNTER — Ambulatory Visit (INDEPENDENT_AMBULATORY_CARE_PROVIDER_SITE_OTHER): Payer: No Typology Code available for payment source | Admitting: Internal Medicine

## 2011-09-10 ENCOUNTER — Encounter: Payer: Self-pay | Admitting: Internal Medicine

## 2011-09-10 DIAGNOSIS — M353 Polymyalgia rheumatica: Secondary | ICD-10-CM

## 2011-09-10 DIAGNOSIS — M2550 Pain in unspecified joint: Secondary | ICD-10-CM

## 2011-09-10 DIAGNOSIS — E538 Deficiency of other specified B group vitamins: Secondary | ICD-10-CM

## 2011-09-10 DIAGNOSIS — E119 Type 2 diabetes mellitus without complications: Secondary | ICD-10-CM

## 2011-09-10 DIAGNOSIS — F329 Major depressive disorder, single episode, unspecified: Secondary | ICD-10-CM

## 2011-09-10 MED ORDER — PREDNISOLONE 5 MG PO TABS: 5.0000 mg | ORAL_TABLET | Freq: Every day | ORAL | Status: DC
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
## 2011-09-10 NOTE — Assessment & Plan Note (Signed)
Continue with current prescription therapy as reflected on the Med list.  

## 2011-09-10 NOTE — Assessment & Plan Note (Signed)
Chronic since 2011. Refused Prednisone before. ESR>100 2012: agreed to try Prednisone at low dose  Potential benefits of a long term steroid  use as well as potential risks  and complications were explained to the patient and were aknowledged. Try Predn 5 mg/d

## 2011-09-10 NOTE — Progress Notes (Signed)
  Subjective:    Patient ID: [name], female    DOB: 25-Feb-1933, 77 y.o.   MRN: [id number]  HPI   The patient is here to follow up on DM, HTN  and chronic moderate pain/FMS symptoms not controlled with medicines. No HAs.   Review of Systems  Constitutional: Positive for fatigue. Negative for chills, activity change, appetite change and unexpected weight change.  HENT: Negative for congestion, mouth sores and sinus pressure.   Eyes: Negative for visual disturbance.  Respiratory: Negative for cough and chest tightness.   Gastrointestinal: Negative for nausea and abdominal pain.  Genitourinary: Negative for frequency, difficulty urinating and vaginal pain.  Musculoskeletal: Positive for myalgias, back pain, arthralgias and gait problem.  Skin: Negative for pallor and rash.  Neurological: Negative for dizziness, tremors, weakness, numbness and headaches.  Psychiatric/Behavioral: Negative for confusion and sleep disturbance.       Objective:   Physical Exam  Constitutional: She appears well-developed. No distress.       obese  HENT:  Head: Normocephalic.  Right Ear: External ear normal.  Left Ear: External ear normal.  Nose: Nose normal.  Mouth/Throat: Oropharynx is clear and moist.  Eyes: Conjunctivae are normal. Pupils are equal, round, and reactive to light. Right eye exhibits no discharge. Left eye exhibits no discharge.  Neck: Normal range of motion. Neck supple. No JVD present. No tracheal deviation present. No thyromegaly present.  Cardiovascular: Normal rate, regular rhythm and normal heart sounds.   Pulmonary/Chest: No stridor. No respiratory distress. She has no wheezes.  Abdominal: Soft. Bowel sounds are normal. She exhibits no distension and no mass. There is no tenderness. There is no rebound and no guarding.  Musculoskeletal: She exhibits edema and tenderness.       Very stiff and slow  Lymphadenopathy:    She has no cervical adenopathy.  Neurological: She  displays normal reflexes. No cranial nerve deficit. She exhibits normal muscle tone. Coordination normal.  Skin: No rash noted. No erythema.  Psychiatric: She has a normal mood and affect. Her behavior is normal. Judgment and thought content normal.    Lab Results  Component Value Date   WBC 4.8 08/21/2011   HGB 10.2* 08/21/2011   HCT 31.8* 08/21/2011   PLT 189.0 08/21/2011   GLUCOSE 85 08/21/2011   CHOL 214* 01/10/2011   TRIG 75.0 01/10/2011   HDL 82.60 01/10/2011   LDLDIRECT 114.8 01/10/2011   LDLCALC  Value: 106        Total Cholesterol/HDL:CHD Risk Coronary Heart Disease Risk Table                     Men   Women  1/2 Average Risk   3.4   3.3* 07/06/2007   ALT 9 08/21/2011   AST 21 08/21/2011   NA 140 08/21/2011   K 5.6* 08/21/2011   CL 109 08/21/2011   CREATININE 1.3* 08/21/2011   BUN 35* 08/21/2011   CO2 22 08/21/2011   TSH 1.12 08/21/2011   INR 1.1 07/05/2007   HGBA1C 6.2 08/21/2011   CT head ok [place]ot [place] ok      Assessment & Plan:

## 2011-09-10 NOTE — Assessment & Plan Note (Signed)
Start Prednisone.

## 2011-09-25 ENCOUNTER — Other Ambulatory Visit: Payer: Self-pay | Admitting: *Deleted

## 2011-09-25 NOTE — Telephone Encounter (Signed)
OK to fill this prescription with additional refills x3 Thank you!  

## 2011-09-25 NOTE — Telephone Encounter (Signed)
Refill request for: Meclizine 12.5 mg Rx that was given to Pt. In hospital for vertigo; having dizzy spells.

## 2011-09-26 MED ORDER — MECLIZINE HCL 12.5 MG PO TABS: 12.5000 mg | ORAL_TABLET | Freq: Three times a day (TID) | ORAL | Status: DC | PRN

## 2011-09-26 NOTE — Telephone Encounter (Signed)
Done

## 2011-10-09 ENCOUNTER — Other Ambulatory Visit: Payer: Self-pay | Admitting: Internal Medicine

## 2011-10-16 ENCOUNTER — Ambulatory Visit: Payer: No Typology Code available for payment source | Admitting: Internal Medicine

## 2011-10-24 ENCOUNTER — Ambulatory Visit (INDEPENDENT_AMBULATORY_CARE_PROVIDER_SITE_OTHER): Payer: No Typology Code available for payment source | Admitting: Internal Medicine

## 2011-10-24 ENCOUNTER — Encounter: Payer: Self-pay | Admitting: Internal Medicine

## 2011-10-24 DIAGNOSIS — R42 Dizziness and giddiness: Secondary | ICD-10-CM

## 2011-10-24 DIAGNOSIS — M353 Polymyalgia rheumatica: Secondary | ICD-10-CM

## 2011-10-24 DIAGNOSIS — R5381 Other malaise: Secondary | ICD-10-CM

## 2011-10-24 DIAGNOSIS — M545 Low back pain: Secondary | ICD-10-CM

## 2011-10-24 DIAGNOSIS — R5383 Other fatigue: Secondary | ICD-10-CM

## 2011-10-24 MED ORDER — FENTANYL 12 MCG/HR TD PT72: 1.0000 | MEDICATED_PATCH | TRANSDERMAL | Status: DC

## 2011-10-24 NOTE — Assessment & Plan Note (Signed)
Better on Prednisone 

## 2011-10-24 NOTE — Assessment & Plan Note (Signed)
Treat PMR/OA

## 2011-10-24 NOTE — Assessment & Plan Note (Signed)
Prn meds 

## 2011-10-24 NOTE — Assessment & Plan Note (Signed)
Chronic LBP  Potential benefits of a long term steroid  use as well as potential risks  and complications were explained to the patient and were aknowledged. Start Duragesic at low dose

## 2011-10-24 NOTE — Progress Notes (Signed)
  Subjective:    Patient ID: [name], female    DOB: [birth year], 77 y.o.   MRN: [id number]  HPI  The patient presents for a follow-up of  chronic hypertension, chronic dyslipidemia, type 2 diabetes controlled with medicines, OA, pains and vertigo. Prednisone helped some: less pain and stiffness on it. Pain is 8/10 now 24/7   Review of Systems  Constitutional: Positive for fatigue. Negative for chills, activity change, appetite change and unexpected weight change.  HENT: Negative for congestion, mouth sores and sinus pressure.   Eyes: Negative for visual disturbance.  Respiratory: Negative for cough and chest tightness.   Gastrointestinal: Negative for nausea and abdominal pain.  Genitourinary: Negative for frequency, difficulty urinating and vaginal pain.  Musculoskeletal: Positive for myalgias, back pain, arthralgias and gait problem.  Skin: Negative for pallor and rash.  Neurological: Negative for dizziness, tremors, weakness, numbness and headaches.  Psychiatric/Behavioral: Negative for confusion and sleep disturbance.       Wt Readings from Last 3 Encounters:  10/24/11 183 lb 8 oz (83.235 kg)  09/10/11 182 lb (82.555 kg)  08/21/11 178 lb (80.74 kg)    Objective:   Physical Exam  Constitutional: She appears well-developed. No distress.       obese  HENT:  Head: Normocephalic.  Right Ear: External ear normal.  Left Ear: External ear normal.  Nose: Nose normal.  Mouth/Throat: Oropharynx is clear and moist.  Eyes: Conjunctivae are normal. Pupils are equal, round, and reactive to light. Right eye exhibits no discharge. Left eye exhibits no discharge.  Neck: Normal range of motion. Neck supple. No JVD present. No tracheal deviation present. No thyromegaly present.  Cardiovascular: Normal rate, regular rhythm and normal heart sounds.   Pulmonary/Chest: No stridor. No respiratory distress. She has no wheezes.  Abdominal: Soft. Bowel sounds are normal. She exhibits no  distension and no mass. There is no tenderness. There is no rebound and no guarding.  Musculoskeletal: She exhibits edema and tenderness.  Lymphadenopathy:    She has no cervical adenopathy.  Neurological: She displays normal reflexes. No cranial nerve deficit. She exhibits normal muscle tone. Coordination abnormal.  Skin: No rash noted. No erythema.  Psychiatric: Her behavior is normal. Judgment and thought content normal.       sad          Assessment & Plan:

## 2011-10-28 ENCOUNTER — Encounter: Payer: Self-pay | Admitting: Internal Medicine

## 2011-11-23 ENCOUNTER — Ambulatory Visit: Payer: No Typology Code available for payment source | Admitting: Internal Medicine

## 2011-12-06 ENCOUNTER — Telehealth: Payer: Self-pay | Admitting: Internal Medicine

## 2011-12-06 NOTE — Telephone Encounter (Signed)
Received copies from [facility],on 1.24.13. Forwarded 1 pages to Dr. Posey Rea ,for review.  sj

## 2011-12-19 ENCOUNTER — Encounter: Payer: Self-pay | Admitting: Internal Medicine

## 2011-12-19 ENCOUNTER — Other Ambulatory Visit (INDEPENDENT_AMBULATORY_CARE_PROVIDER_SITE_OTHER): Payer: No Typology Code available for payment source

## 2011-12-19 ENCOUNTER — Ambulatory Visit (INDEPENDENT_AMBULATORY_CARE_PROVIDER_SITE_OTHER): Payer: No Typology Code available for payment source | Admitting: Internal Medicine

## 2011-12-19 DIAGNOSIS — E538 Deficiency of other specified B group vitamins: Secondary | ICD-10-CM

## 2011-12-19 DIAGNOSIS — M353 Polymyalgia rheumatica: Secondary | ICD-10-CM

## 2011-12-19 DIAGNOSIS — I10 Essential (primary) hypertension: Secondary | ICD-10-CM

## 2011-12-19 DIAGNOSIS — M25569 Pain in unspecified knee: Secondary | ICD-10-CM

## 2011-12-19 DIAGNOSIS — F329 Major depressive disorder, single episode, unspecified: Secondary | ICD-10-CM

## 2011-12-19 DIAGNOSIS — E119 Type 2 diabetes mellitus without complications: Secondary | ICD-10-CM

## 2011-12-19 DIAGNOSIS — I2510 Atherosclerotic heart disease of native coronary artery without angina pectoris: Secondary | ICD-10-CM

## 2011-12-19 DIAGNOSIS — M25561 Pain in right knee: Secondary | ICD-10-CM

## 2011-12-19 DIAGNOSIS — E559 Vitamin D deficiency, unspecified: Secondary | ICD-10-CM

## 2011-12-19 DIAGNOSIS — M2550 Pain in unspecified joint: Secondary | ICD-10-CM

## 2011-12-19 DIAGNOSIS — R5381 Other malaise: Secondary | ICD-10-CM

## 2011-12-19 DIAGNOSIS — N259 Disorder resulting from impaired renal tubular function, unspecified: Secondary | ICD-10-CM

## 2011-12-19 DIAGNOSIS — R5383 Other fatigue: Secondary | ICD-10-CM

## 2011-12-19 LAB — COMPREHENSIVE METABOLIC PANEL
ALT: 13 U/L (ref 0–35)
CO2: 23 mEq/L (ref 19–32)
GFR: 60.72 mL/min (ref >60.00)
Potassium: 4.8 mEq/L (ref 3.5–5.1)
Sodium: 139 mEq/L (ref 135–145)
Total Bilirubin: 0.6 mg/dL (ref 0.3–1.2)
Total Protein: 8.6 g/dL — ABNORMAL HIGH (ref 6.0–8.3)

## 2011-12-19 MED ORDER — METHYLPREDNISOLONE ACETATE PF 80 MG/ML IJ SUSP: 80.0000 mg | Freq: Once | INTRAMUSCULAR | Status: DC

## 2011-12-19 MED ORDER — LOSARTAN POTASSIUM 100 MG PO TABS: 100.0000 mg | ORAL_TABLET | Freq: Every day | ORAL | Status: DC

## 2011-12-19 MED ORDER — HYDROCODONE-ACETAMINOPHEN 5-325 MG PO TABS: 1.0000 | ORAL_TABLET | Freq: Two times a day (BID) | ORAL | Status: AC | PRN

## 2011-12-19 MED ORDER — OXYBUTYNIN CHLORIDE 5 MG PO TABS: 5.0000 mg | ORAL_TABLET | Freq: Three times a day (TID) | ORAL | Status: DC

## 2011-12-19 NOTE — Assessment & Plan Note (Signed)
Watching  Labs today

## 2011-12-19 NOTE — Assessment & Plan Note (Signed)
Continue with current prescription therapy as reflected on the Med list. Med cost is an issue. Will try Cozaar

## 2011-12-19 NOTE — Patient Instructions (Signed)
Postprocedure instructions :    A Band-Aid should be left on for 12 hours. Injection therapy is not a cure itself. It is used in conjunction with other modalities. You can use nonsteroidal anti-inflammatories like ibuprofen , hot and cold compresses. Rest is recommended in the next 24 hours. You need to report immediately  if fever, chills or any signs of infection develop. 

## 2011-12-19 NOTE — Assessment & Plan Note (Signed)
Duragesic is too $$ Try Hydrocodone prn

## 2011-12-19 NOTE — Assessment & Plan Note (Signed)
2/13 refused steroids - they hurt my stomach.Shelby KitchenMarland Jefferson

## 2011-12-19 NOTE — Assessment & Plan Note (Signed)
B OA and bursitis, chronic See procedure

## 2011-12-19 NOTE — Assessment & Plan Note (Signed)
Continue with current prescription therapy as reflected on the Med list.  

## 2011-12-19 NOTE — Progress Notes (Signed)
  Subjective:    Patient ID: [name], female    DOB: 10/17/1933, 78 y.o.   MRN: [id number]  HPI  The patient presents for a follow-up of PMR, chronic hypertension, chronic dyslipidemia, type 2 diabetes. Duragesic was too $$$ C/o B knee pain, c/o cough    Review of Systems  Constitutional: Positive for fatigue. Negative for chills, activity change, appetite change and unexpected weight change.  HENT: Negative for congestion, mouth sores and sinus pressure.   Eyes: Negative for visual disturbance.  Respiratory: Negative for cough and chest tightness.   Gastrointestinal: Negative for nausea and abdominal pain.  Genitourinary: Negative for frequency, difficulty urinating and vaginal pain.  Musculoskeletal: Positive for arthralgias and gait problem. Negative for myalgias and back pain.  Skin: Negative for pallor and rash.  Neurological: Positive for weakness. Negative for dizziness, tremors, numbness and headaches.  Psychiatric/Behavioral: Negative for suicidal ideas, confusion and sleep disturbance. The patient is not nervous/anxious.        Objective:   Physical Exam  Constitutional: She appears well-developed. No distress.  HENT:  Head: Normocephalic.  Right Ear: External ear normal.  Left Ear: External ear normal.  Nose: Nose normal.  Mouth/Throat: Oropharynx is clear and moist.  Eyes: Conjunctivae are normal. Pupils are equal, round, and reactive to light. Right eye exhibits no discharge. Left eye exhibits no discharge.  Neck: Normal range of motion. Neck supple. No JVD present. No tracheal deviation present. No thyromegaly present.  Cardiovascular: Normal rate, regular rhythm and normal heart sounds.   Pulmonary/Chest: No stridor. No respiratory distress. She has no wheezes.  Abdominal: Soft. Bowel sounds are normal. She exhibits no distension and no mass. There is no tenderness. There is no rebound and no guarding.  Musculoskeletal: She exhibits tenderness (B B  anserinae). She exhibits no edema.  Lymphadenopathy:    She has no cervical adenopathy.  Neurological: She displays normal reflexes. No cranial nerve deficit. She exhibits normal muscle tone. Coordination abnormal.  Skin: No rash noted. No erythema.  Psychiatric: She has a normal mood and affect. Her behavior is normal. Judgment and thought content normal.     Procedure Note :     Procedure :Joint bursae Injection,  L,R knee   Indication:  Joint osteoarthritis/byrsitis with refractory  chronic pain.   Risks including unsuccessful procedure , bleeding, infection, bruising, skin atrophy and others were explained to the patient in detail as well as the benefits. Informed consent was obtained and signed.   Tthe patient was placed in a comfortable position. Lateral approach was used. Skin was prepped with Betadine and alcohol . Then, a 5 cc syringe with a 1.5 inch long 24-gauge needle was used for a bursa anserina injectionwith 3 mL of 2% lidocaine and 40 mg of Depo-Medrol .  Band-Aid was applied. Identical procedure was repeated on the opposite side.   Tolerated well. Complications: None. Good pain relief following the procedure.   Postprocedure instructions :    A Band-Aid should be left on for 12 hours. Injection therapy is not a cure itself. It is used in conjunction with other modalities. You can use nonsteroidal anti-inflammatories like ibuprofen , hot and cold compresses. Rest is recommended in the next 24 hours. You need to report immediately  if fever, chills or any signs of infection develop.       Assessment & Plan:

## 2011-12-20 LAB — VITAMIN D 25 HYDROXY (VIT D DEFICIENCY, FRACTURES): Vit D, 25-Hydroxy: 35 ng/mL (ref 30–89)

## 2012-01-14 ENCOUNTER — Telehealth: Payer: Self-pay | Admitting: *Deleted

## 2012-01-14 NOTE — Telephone Encounter (Signed)
Pt calling stating the new BP med Losartan 100 mg is causing severe GI upset with nausea and weakness. She states she would rather take Lisinopril and cough than take this and feel sick. Please advise what should she do in PCP absence?

## 2012-01-14 NOTE — Telephone Encounter (Signed)
Stop losartan. Wait 2-3 days then try benicar 20- mg once a day - samples if we have them.

## 2012-01-15 ENCOUNTER — Encounter (INDEPENDENT_AMBULATORY_CARE_PROVIDER_SITE_OTHER): Payer: Medicare HMO

## 2012-01-15 ENCOUNTER — Telehealth: Payer: Self-pay

## 2012-01-15 DIAGNOSIS — I10 Essential (primary) hypertension: Secondary | ICD-10-CM

## 2012-01-15 NOTE — Telephone Encounter (Signed)
FYI.Shelby KitchenMarland KitchenPatient stopped by office today stating that she has been without BP medication losartin x 3 days due to nausea and drowsiness. BP was 160/70 today, reported to Dr Debby Bud who advised to give samples on Benicar 20 mg qd and recheck BP x 1 wk.

## 2012-01-15 NOTE — Telephone Encounter (Signed)
Pt informed of MD's advisement. Samples of Benicar upfront for pt to pickup.

## 2012-01-22 MED ORDER — FUROSEMIDE 20 MG PO TABS: 20.0000 mg | ORAL_TABLET | Freq: Every day | ORAL | Status: DC

## 2012-01-22 NOTE — Telephone Encounter (Signed)
Start Lasix 1 q am Thx

## 2012-01-22 NOTE — Telephone Encounter (Signed)
Addended by: Janeal Holmes on: 01/22/2012 05:51 PM   Modules accepted: Orders, Medications

## 2012-01-22 NOTE — Telephone Encounter (Signed)
Pt calling today stating she cant take Benicar because it causes HA and GI upset. She hasnt had anything for BP in 3 days. Please advise.

## 2012-01-23 ENCOUNTER — Encounter: Payer: Self-pay | Admitting: Internal Medicine

## 2012-01-23 ENCOUNTER — Ambulatory Visit (INDEPENDENT_AMBULATORY_CARE_PROVIDER_SITE_OTHER): Payer: Medicare HMO | Admitting: Internal Medicine

## 2012-01-23 VITALS — BP 160/100 | HR 88 | Temp 98.6°F | Resp 16

## 2012-01-23 DIAGNOSIS — E119 Type 2 diabetes mellitus without complications: Secondary | ICD-10-CM

## 2012-01-23 DIAGNOSIS — F411 Generalized anxiety disorder: Secondary | ICD-10-CM

## 2012-01-23 DIAGNOSIS — M353 Polymyalgia rheumatica: Secondary | ICD-10-CM

## 2012-01-23 DIAGNOSIS — F3289 Other specified depressive episodes: Secondary | ICD-10-CM

## 2012-01-23 DIAGNOSIS — F329 Major depressive disorder, single episode, unspecified: Secondary | ICD-10-CM

## 2012-01-23 DIAGNOSIS — N259 Disorder resulting from impaired renal tubular function, unspecified: Secondary | ICD-10-CM

## 2012-01-23 DIAGNOSIS — I10 Essential (primary) hypertension: Secondary | ICD-10-CM

## 2012-01-23 DIAGNOSIS — E538 Deficiency of other specified B group vitamins: Secondary | ICD-10-CM

## 2012-01-23 MED ORDER — FUROSEMIDE 20 MG PO TABS: 20.0000 mg | ORAL_TABLET | Freq: Every day | ORAL | Status: DC

## 2012-01-23 NOTE — Assessment & Plan Note (Signed)
Declined meds °

## 2012-01-23 NOTE — Assessment & Plan Note (Signed)
Treat HTN 

## 2012-01-23 NOTE — Assessment & Plan Note (Signed)
Discussed.

## 2012-01-23 NOTE — Assessment & Plan Note (Signed)
Refused steroids 

## 2012-01-23 NOTE — Progress Notes (Signed)
Patient ID: [name], female   DOB: February 25, 1933, 78 y.o.   MRN: [id number]  Subjective:    Patient ID: [name], female    DOB: Apr 09, 1933, 78 y.o.   MRN: [id number]  Hypertension Associated symptoms include headaches.  Headache  Associated symptoms include weakness. Pertinent negatives include no abdominal pain, back pain, coughing, dizziness, nausea, numbness or sinus pressure. Her past medical history is significant for hypertension.   Ref to [facility] 01/23/12 The patient presents for a follow-up of PMR, chronic hypertension, chronic dyslipidemia, type 2 diabetes. Duragesic was too $$$ Benicar caused nausea C/o B knee pain  BP Readings from Last 3 Encounters:  01/23/12 160/100  12/19/11 120/74  10/24/11 142/92   Wt Readings from Last 3 Encounters:  12/19/11 189 lb (85.73 kg)  10/24/11 183 lb 8 oz (83.235 kg)  09/10/11 182 lb (82.555 kg)      Review of Systems  Constitutional: Positive for fatigue. Negative for chills, activity change, appetite change and unexpected weight change.  HENT: Negative for congestion, mouth sores and sinus pressure.   Eyes: Negative for visual disturbance.  Respiratory: Negative for cough and chest tightness.   Gastrointestinal: Negative for nausea and abdominal pain.  Genitourinary: Negative for frequency, difficulty urinating and vaginal pain.  Musculoskeletal: Positive for arthralgias and gait problem. Negative for myalgias and back pain.  Skin: Negative for pallor and rash.  Neurological: Positive for weakness and headaches. Negative for dizziness, tremors and numbness.  Psychiatric/Behavioral: Negative for suicidal ideas, confusion and sleep disturbance. The patient is not nervous/anxious.        Objective:   Physical Exam  Constitutional: She appears well-developed. No distress.  HENT:  Head: Normocephalic.  Right Ear: External ear normal.  Left Ear: External ear normal.  Nose: Nose normal.  Mouth/Throat: Oropharynx is clear  and moist.  Eyes: Conjunctivae are normal. Pupils are equal, round, and reactive to light. Right eye exhibits no discharge. Left eye exhibits no discharge.  Neck: Normal range of motion. Neck supple. No JVD present. No tracheal deviation present. No thyromegaly present.  Cardiovascular: Normal rate, regular rhythm and normal heart sounds.   Pulmonary/Chest: No stridor. No respiratory distress. She has no wheezes.  Abdominal: Soft. Bowel sounds are normal. She exhibits no distension and no mass. There is no tenderness. There is no rebound and no guarding.  Musculoskeletal: She exhibits tenderness (B B anserinae). She exhibits no edema.  Lymphadenopathy:    She has no cervical adenopathy.  Neurological: She displays normal reflexes. No cranial nerve deficit. She exhibits normal muscle tone. Coordination abnormal.  Skin: No rash noted. No erythema.  Psychiatric: She has a normal mood and affect. Her behavior is normal. Judgment and thought content normal.   Lab Results  Component Value Date   WBC 4.8 08/21/2011   HGB 10.2* 08/21/2011   HCT 31.8* 08/21/2011   PLT 189.0 08/21/2011   GLUCOSE 87 12/19/2011   CHOL 214* 01/10/2011   TRIG 75.0 01/10/2011   HDL 82.60 01/10/2011   LDLDIRECT 114.8 01/10/2011   LDLCALC  Value: 106        Total Cholesterol/HDL:CHD Risk Coronary Heart Disease Risk Table                     Men   Women  1/2 Average Risk   3.4   3.3* 07/06/2007   ALT 13 12/19/2011   AST 22 12/19/2011   NA 139 12/19/2011   K 4.8 12/19/2011   CL 107  12/19/2011   CREATININE 1.1 12/19/2011   BUN 30* 12/19/2011   CO2 23 12/19/2011   TSH 1.12 08/21/2011   INR 1.1 07/05/2007   HGBA1C 6.2 12/19/2011           Assessment & Plan:

## 2012-01-23 NOTE — Assessment & Plan Note (Signed)
Chronic  Med cost is an issue.  Will try Furosemide at low dose

## 2012-01-23 NOTE — Assessment & Plan Note (Signed)
Continue with current prescription therapy as reflected on the Med list.  

## 2012-01-24 ENCOUNTER — Other Ambulatory Visit: Payer: Self-pay | Admitting: *Deleted

## 2012-01-24 MED ORDER — METFORMIN HCL 500 MG PO TABS: 500.0000 mg | ORAL_TABLET | Freq: Two times a day (BID) | ORAL | Status: DC

## 2012-01-24 NOTE — Telephone Encounter (Signed)
Pt was in office.

## 2012-02-04 ENCOUNTER — Telehealth: Payer: Self-pay | Admitting: *Deleted

## 2012-02-04 NOTE — Telephone Encounter (Signed)
X 1 wk, then 1 bid Thx

## 2012-02-04 NOTE — Telephone Encounter (Signed)
Pt states AVP advised her to take new BP med 1/2 qd starting out. How long should she continue this?

## 2012-02-05 NOTE — Telephone Encounter (Signed)
Pt informed

## 2012-02-18 ENCOUNTER — Other Ambulatory Visit: Payer: Self-pay | Admitting: Internal Medicine

## 2012-03-18 ENCOUNTER — Ambulatory Visit: Payer: Medicare HMO | Admitting: Cardiology

## 2012-04-01 ENCOUNTER — Encounter: Payer: Self-pay | Admitting: Cardiology

## 2012-04-16 ENCOUNTER — Other Ambulatory Visit: Payer: Self-pay

## 2012-04-16 MED ORDER — OXYBUTYNIN CHLORIDE 5 MG PO TABS: 5.0000 mg | ORAL_TABLET | Freq: Three times a day (TID) | ORAL | Status: DC

## 2012-04-16 MED ORDER — GLUCOSE BLOOD VI STRP: 1.0000 | ORAL_STRIP | Freq: Every day | Status: DC

## 2012-04-16 MED ORDER — FUROSEMIDE 20 MG PO TABS: 20.0000 mg | ORAL_TABLET | Freq: Every day | ORAL | Status: DC

## 2012-04-16 MED ORDER — METFORMIN HCL 500 MG PO TABS: 500.0000 mg | ORAL_TABLET | Freq: Two times a day (BID) | ORAL | Status: DC

## 2012-04-16 MED ORDER — LANCETS MISC: 1.0000 | Freq: Every day | Status: DC

## 2012-04-17 ENCOUNTER — Ambulatory Visit: Payer: No Typology Code available for payment source | Admitting: Internal Medicine

## 2012-04-21 ENCOUNTER — Telehealth: Payer: Self-pay | Admitting: Internal Medicine

## 2012-04-21 NOTE — Telephone Encounter (Signed)
Pt informed

## 2012-04-21 NOTE — Telephone Encounter (Signed)
Reduce Metformin from 1 bid to 1 qd OV w/any MD this week Thx

## 2012-04-21 NOTE — Telephone Encounter (Signed)
[name] BP IS ELEVATED (168-85), BS IS LOW (57-79) AND NO ENERGY.  COULD SHE BE WORKED IN?  ALSO TRANSFERRED HER TO CALL A NURSE.

## 2012-04-21 NOTE — Telephone Encounter (Signed)
Per pt low blood sugar for a wk--requesting today appt--no has availability---

## 2012-05-05 ENCOUNTER — Ambulatory Visit (INDEPENDENT_AMBULATORY_CARE_PROVIDER_SITE_OTHER): Payer: Medicare HMO | Admitting: Cardiology

## 2012-05-05 ENCOUNTER — Encounter: Payer: Self-pay | Admitting: Cardiology

## 2012-05-05 VITALS — BP 140/80 | HR 68 | Ht 64.0 in | Wt 192.8 lb

## 2012-05-05 DIAGNOSIS — I2510 Atherosclerotic heart disease of native coronary artery without angina pectoris: Secondary | ICD-10-CM

## 2012-05-05 MED ORDER — PRAVASTATIN SODIUM 20 MG PO TABS: 20.0000 mg | ORAL_TABLET | Freq: Every evening | ORAL | Status: DC

## 2012-05-05 NOTE — Patient Instructions (Addendum)
Your physician has requested that you have a lexiscan myoview. For further information please visit [email or web address] Please follow instruction sheet, as given.  Start Pravastatin 20mg  daily with your evening meal.

## 2012-05-05 NOTE — Progress Notes (Signed)
HPI Patient presents for followup of known coronary disease. She saw Dr. Gala Romney in the past but it has been greater than 3 years since her last appointment. She is very limited by joint pains. Her biggest complaint is really fatigue. She describes this throughout the day. She does have chest discomfort and shortness of breath. She reports the discomfort sporadically. She cannot qualify or quantify it. It happens at rest. She sedentary but doesn't notice that it comes on when she tries to do activities. She sleeps on 2 pillows which has been chronic. She's not describing any PND. She does have difficulty climbing her stairs and says she has to crawl using her hands. She short of breath midway up the stairs.  Allergies  Allergen Reactions  . Amlodipine Besylate     REACTION: dizzy  . Aspirin   . Atenolol     REACTION: fatigue  . Benazepril     cough  . Benicar (Olmesartan Medoxomil)     HA  . Cozaar     nausea  . Hydrochlorothiazide W-Triamterene     REACTION: dizzy  . Hydrocodone     REACTION: HA  . Hydroxyzine Pamoate   . Iodine   . Lisinopril     REACTION: tired, cough  . Metformin     REACTION: nausea  . Penicillins   . Prednisolone     My stomach hurts  . Tramadol Hcl     REACTION: HA    Current Outpatient Prescriptions  Medication Sig Dispense Refill  . aspirin EC 325 MG tablet Take 325 mg by mouth daily.        . Cholecalciferol (VITAMIN D3) 1000 UNITS tablet Take 1 tablet (1,000 Units total) by mouth daily.  100 tablet  3  . [facility] NON-ASPIRIN EXTRA STRENGTH 500 MG tablet TAKE 1 TABLET BY MOUTH 3 TIMES A DAY AS NEEDED FOR PAIN  100 tablet  3  . [facility] VITAMIN B-12 500 MCG tablet TAKE 1 TABLET BY MOUTH EVERY DAY  30 tablet  5  . glucose blood (ONE TOUCH TEST STRIPS) test strip 1 each by Other route daily.  100 each  1  . Lancets MISC 1 each by Does not apply route daily. Please dispense lancets -OneTouch Ultra. Dx:250.00  100 each  1  . metFORMIN (GLUCOPHAGE) 500 MG  tablet Take 1 tablet (500 mg total) by mouth 2 (two) times daily with a meal.  180 tablet  1  . oxybutynin (DITROPAN) 5 MG tablet Take 1 tablet (5 mg total) by mouth 3 (three) times daily. prn  270 tablet  1  . DISCONTD: olmesartan (BENICAR) 20 MG tablet Take 20 mg by mouth daily.         Past Medical History  Diagnosis Date  . CAD (coronary artery disease)     s/p stenting of LAD 1999- cath 5-08 EF normal LAD 30-40% restenosis. D1 50% D2 80% LCX & RCA minimal plaque  . HTN (hypertension)   . Hyperlipemia   . Anemia     iron deficiency  . Depression   . DVT (deep venous thrombosis)   . Gout   . Osteoporosis   . Pancreatitis   . GERD (gastroesophageal reflux disease)   . Renal insufficiency     Cr 1.2-1.3  . Obesity   . Diabetes mellitus   . Polyarthritis     DJD/ possible PMR  . Vitamin d deficiency   . B12 deficiency   . Tinnitus   . Anxiety   .  Aneurysm, thoracic aortic   . Constipation   . Urinary frequency     Past Surgical History  Procedure Date  . Hemorrhoid surgery   . Abdominal hysterectomy   . Taxus stent   . Cholecystectomy   . Tubal ligation     Family History  Problem Relation Age of Onset  . Adopted: Yes  . Diabetes Mother   . Hypertension Father     History   Social History  . Marital Status: Married    Spouse Name: N/A    Number of Children: N/A  . Years of Education: N/A   Occupational History  . Retired    Social History Main Topics  . Smoking status: Never Smoker   . Smokeless tobacco: Not on file  . Alcohol Use: No  . Drug Use: No  . Sexually Active: Not Currently   Other Topics Concern  . Not on file   Social History Narrative   She lives in [place] with her Aris Everts is retiredNo tobacco or alcohol use.    ROS:  Positive for headaches, occasional dizziness, occasional palpitations, constipation, urinary frequency, nocturia, blurred vision, joint pains, tenderness in her skin diffusely . Otherwise, as stated in  the HPI and negative for all other systems.   PHYSICAL EXAM BP 140/80  Pulse 68  Ht 5\' 4"  (1.626 m)  Wt 192 lb 12.8 oz (87.454 kg)  BMI 33.09 kg/m2 GENERAL:  Well appearing HEENT:  Pupils equal round and reactive, fundi not visualized, oral mucosa unremarkable, dentures NECK:  No jugular venous distention, waveform within normal limits, carotid upstroke brisk and symmetric, no bruits, no thyromegaly LYMPHATICS:  No cervical, inguinal adenopathy LUNGS:  Clear to auscultation bilaterally BACK:  No CVA tenderness CHEST:  Unremarkable HEART:  PMI not displaced or sustained,S1 and S2 within normal limits, no S3, no S4, no clicks, no rubs, no murmurs ABD:  Flat, positive bowel sounds normal in frequency in pitch, no bruits, no rebound, no guarding, no midline pulsatile mass, no hepatomegaly, no splenomegaly, obese EXT:  2 plus pulses throughout, no edema, no cyanosis no clubbing SKIN:  No rashes no nodules NEURO:  Cranial nerves II through XII grossly intact, motor grossly intact throughout PSYCH:  Cognitively intact, oriented to person place and time  EKG:  Sinus rhythm, rate 68, axis within normal limits, intervals within normal limits, no acute ST-T wave changes.  05/05/2012   ASSESSMENT AND PLAN

## 2012-05-05 NOTE — Assessment & Plan Note (Signed)
Fatigue is her most significant complaint. I do see that she's had chronic anemia and I will defer followup of this to [name], MD .  This could be contributing. Her TSH was normal.

## 2012-05-05 NOTE — Assessment & Plan Note (Signed)
She is not on a statin. Her last LDL last year was 114. I'm going to try pravastatin 20. I don't see statins as one of her multiple allergies. She can get a lipid profile in about 8 weeks to followup with a goal less than 70 LDL.

## 2012-05-05 NOTE — Assessment & Plan Note (Signed)
Her blood pressure is upper limits of normal. She has had multiple medication intolerances. At this point no change in therapy is indicated. She should avoid salt and weight loss would help to keep her at target.

## 2012-05-05 NOTE — Assessment & Plan Note (Signed)
The patient has multiple somatic complaints including chest discomfort and shortness of breath. Given her past history she would need stress testing. However, she would not be a little walk on a treadmill. She'll need a [employer].

## 2012-05-14 ENCOUNTER — Ambulatory Visit (HOSPITAL_COMMUNITY): Payer: Medicare HMO | Attending: Cardiology | Admitting: Radiology

## 2012-05-14 VITALS — BP 174/72 | HR 58 | Ht 64.0 in | Wt 187.0 lb

## 2012-05-14 DIAGNOSIS — E785 Hyperlipidemia, unspecified: Secondary | ICD-10-CM | POA: Insufficient documentation

## 2012-05-14 DIAGNOSIS — R0609 Other forms of dyspnea: Secondary | ICD-10-CM | POA: Insufficient documentation

## 2012-05-14 DIAGNOSIS — R0989 Other specified symptoms and signs involving the circulatory and respiratory systems: Secondary | ICD-10-CM | POA: Insufficient documentation

## 2012-05-14 DIAGNOSIS — R0602 Shortness of breath: Secondary | ICD-10-CM

## 2012-05-14 DIAGNOSIS — E119 Type 2 diabetes mellitus without complications: Secondary | ICD-10-CM | POA: Insufficient documentation

## 2012-05-14 DIAGNOSIS — R0789 Other chest pain: Secondary | ICD-10-CM | POA: Insufficient documentation

## 2012-05-14 DIAGNOSIS — R079 Chest pain, unspecified: Secondary | ICD-10-CM

## 2012-05-14 DIAGNOSIS — I10 Essential (primary) hypertension: Secondary | ICD-10-CM | POA: Insufficient documentation

## 2012-05-14 DIAGNOSIS — I712 Thoracic aortic aneurysm, without rupture, unspecified: Secondary | ICD-10-CM | POA: Insufficient documentation

## 2012-05-14 DIAGNOSIS — R000 Tachycardia, unspecified: Secondary | ICD-10-CM | POA: Insufficient documentation

## 2012-05-14 DIAGNOSIS — R002 Palpitations: Secondary | ICD-10-CM | POA: Insufficient documentation

## 2012-05-14 DIAGNOSIS — I2510 Atherosclerotic heart disease of native coronary artery without angina pectoris: Secondary | ICD-10-CM

## 2012-05-14 MED ORDER — REGADENOSON 0.4 MG/5ML IV SOLN
0.4000 mg | Freq: Once | INTRAVENOUS | Status: AC
  Administered 2012-05-14: 0.4 mg via INTRAVENOUS

## 2012-05-14 MED ORDER — TECHNETIUM TC 99M TETROFOSMIN IV KIT
33.0000 | PACK | Freq: Once | INTRAVENOUS | Status: AC | PRN
  Administered 2012-05-14: 33 via INTRAVENOUS

## 2012-05-14 MED ORDER — TECHNETIUM TC 99M TETROFOSMIN IV KIT
10.4000 | PACK | Freq: Once | INTRAVENOUS | Status: AC | PRN
  Administered 2012-05-14: 10 via INTRAVENOUS

## 2012-05-14 NOTE — Progress Notes (Signed)
[facility] SITE 3 NUCLEAR MED [address] [place] [address] [phone]  Cardiology Nuclear Med Study  [name] is a 78 y.o. female     MRN : [id number]     DOB: December 31, 1932  Procedure Date: 05/14/2012  Nuclear Med Background Indication for Stress Test:  Evaluation for Ischemia and PTCA/Stent Patency History:  '99 PTCA/Stent-LAD; '06 OZH:YQMVHQ, EF=53%; '08 NSTEMI>Cath:N/O CAD, patent stent, medical tx; Thoracic Aortic Aneurysm Cardiac Risk Factors: Hypertension, Lipids and NIDDM  Symptoms:  Chest Pressure.  (last episode of chest discomfort was about a week ago), DOE, Fatigue, Palpitations and Rapid HR   Nuclear Pre-Procedure Caffeine/Decaff Intake:  None > 12 hrs NPO After: 4:00pm   Lungs:  clear O2 Sat: 98% on room air. IV 0.9% NS with Angio Cath:  22g  IV Site: L Antecubital x 1, tolerated well IV Started by:  Irean Hong, RN  Chest Size (in):  40 Cup Size: B  Height: 5\' 4"  (1.626 m)  Weight:  187 lb (84.823 kg)  BMI:  Body mass index is 32.10 kg/(m^2). Tech Comments:  Held metformin today    Nuclear Med Study 1 or 2 day study: 1 day  Stress Test Type:  Lexiscan  Reading MD: Willa Rough, MD  Order Authorizing Provider:  Rollene Rotunda, MD  Resting Radionuclide: Technetium 33m Tetrofosmin  Resting Radionuclide Dose: 10.4 mCi   Stress Radionuclide:  Technetium 71m Tetrofosmin  Stress Radionuclide Dose: 31.5 mCi           Stress Protocol Rest HR: 58 Stress HR: 110  Rest BP: 174/72 Stress BP: 163/74  Exercise Time (min): n/a METS: n/a   Predicted Max HR: 143 bpm % Max HR: 76.92 bpm Rate Pressure Product: 46962   Dose of Adenosine (mg):  n/a Dose of Lexiscan: 0.4 mg  Dose of Atropine (mg): n/a Dose of Dobutamine: n/a mcg/kg/min (at max HR)  Stress Test Technologist: Smiley Houseman, CMA-N  Nuclear Technologist:  Domenic Polite, CNMT     Rest Procedure:  Myocardial perfusion imaging was performed at rest 45 minutes following the  intravenous administration of Technetium 67m Tetrofosmin.  Rest ECG: Sinus bradycardia.  Stress Procedure:  The patient received IV Lexiscan 0.4 mg over 15-seconds.  Technetium 70m Tetrofosmin injected at 30-seconds.  There were nonspecific T-wave changes with Lexiscan.  Quantitative spect images were obtained after a 45 minute delay.  Stress ECG: No significant change from baseline ECG  QPS Raw Data Images:  Normal; no motion artifact; normal heart/lung ratio. Stress Images:  Normal homogeneous uptake in all areas of the myocardium. Rest Images:  Normal homogeneous uptake in all areas of the myocardium. Subtraction (SDS):  There is no evidence of scar or ischemia. Transient Ischemic Dilatation (Normal <1.22):  0.96 Lung/Heart Ratio (Normal <0.45):  0.28  Quantitative Gated Spect Images QGS EDV:  88 ml QGS ESV:  33 ml  Impression Exercise Capacity:  Lexiscan with no exercise. BP Response:  Normal blood pressure response. Clinical Symptoms:  Short of breath.  ECG Impression:  No significant ST segment change suggestive of ischemia. Comparison with Prior Nuclear Study: No images to compare  Overall Impression:  Normal stress nuclear study.  LV Ejection Fraction: 62%.  LV Wall Motion:  NL LV Function; NL Wall Motion  [name] 05/14/2012

## 2012-05-16 ENCOUNTER — Ambulatory Visit (INDEPENDENT_AMBULATORY_CARE_PROVIDER_SITE_OTHER): Payer: Medicare HMO | Admitting: Internal Medicine

## 2012-05-16 ENCOUNTER — Encounter: Payer: Self-pay | Admitting: Internal Medicine

## 2012-05-16 ENCOUNTER — Other Ambulatory Visit (INDEPENDENT_AMBULATORY_CARE_PROVIDER_SITE_OTHER): Payer: Medicare HMO

## 2012-05-16 ENCOUNTER — Telehealth: Payer: Self-pay | Admitting: Internal Medicine

## 2012-05-16 VITALS — BP 160/80 | HR 80 | Temp 98.3°F | Resp 16 | Wt 189.0 lb

## 2012-05-16 DIAGNOSIS — E538 Deficiency of other specified B group vitamins: Secondary | ICD-10-CM

## 2012-05-16 DIAGNOSIS — M545 Low back pain, unspecified: Secondary | ICD-10-CM

## 2012-05-16 DIAGNOSIS — E119 Type 2 diabetes mellitus without complications: Secondary | ICD-10-CM

## 2012-05-16 DIAGNOSIS — I10 Essential (primary) hypertension: Secondary | ICD-10-CM

## 2012-05-16 DIAGNOSIS — R5383 Other fatigue: Secondary | ICD-10-CM

## 2012-05-16 DIAGNOSIS — M2550 Pain in unspecified joint: Secondary | ICD-10-CM

## 2012-05-16 DIAGNOSIS — M353 Polymyalgia rheumatica: Secondary | ICD-10-CM

## 2012-05-16 DIAGNOSIS — K219 Gastro-esophageal reflux disease without esophagitis: Secondary | ICD-10-CM

## 2012-05-16 LAB — TSH: TSH: 0.96 u[IU]/mL (ref 0.35–5.50)

## 2012-05-16 LAB — BASIC METABOLIC PANEL
CO2: 22 mEq/L (ref 19–32)
Calcium: 9.7 mg/dL (ref 8.4–10.5)
Chloride: 103 mEq/L (ref 96–112)
Potassium: 4.3 mEq/L (ref 3.5–5.1)
Sodium: 137 mEq/L (ref 135–145)

## 2012-05-16 LAB — CBC WITH DIFFERENTIAL/PLATELET
Basophils Relative: 0.1 % (ref 0.0–3.0)
Eosinophils Relative: 2.8 % (ref 0.0–5.0)
HCT: 29.9 % — ABNORMAL LOW (ref 36.0–46.0)
MCV: 91.1 fl (ref 78.0–100.0)
Monocytes Absolute: 0.3 10*3/uL (ref 0.1–1.0)
Neutrophils Relative %: 72.7 % (ref 43.0–77.0)
RBC: 3.28 Mil/uL — ABNORMAL LOW (ref 3.87–5.11)
WBC: 4.5 10*3/uL (ref 4.5–10.5)

## 2012-05-16 MED ORDER — GABAPENTIN 100 MG PO CAPS: 100.0000 mg | ORAL_CAPSULE | Freq: Three times a day (TID) | ORAL | Status: DC

## 2012-05-16 MED ORDER — OMEPRAZOLE 40 MG PO CPDR: 40.0000 mg | DELAYED_RELEASE_CAPSULE | Freq: Every day | ORAL | Status: DC

## 2012-05-16 NOTE — Assessment & Plan Note (Signed)
Chronic since 2011. Refused Prednisone before. ESR>100 2012: agreed to try Prednisone at low dose  Potential benefits of a long term steroid  use as well as potential risks  and complications were explained to the patient and were aknowledged. 2/13, 7/13 refused steroids - they hurt my stomach.Marland KitchenMarland Kitchen

## 2012-05-16 NOTE — Assessment & Plan Note (Signed)
Continue with current nonprescription therapy - Tylenol -  as reflected on the Med list. Try Gabapentin

## 2012-05-16 NOTE — Assessment & Plan Note (Signed)
Chronic, multifactorial. CFS 

## 2012-05-16 NOTE — Assessment & Plan Note (Signed)
Continue with current prescription therapy as reflected on the Med list.  

## 2012-05-16 NOTE — Assessment & Plan Note (Signed)
Start Prilosec.

## 2012-05-16 NOTE — Assessment & Plan Note (Signed)
Related to PMR/OA  Potential benefits of a long term opioids use as well as potential risks (i.e. addiction risk, apnea etc) and complications (i.e. Somnolence, constipation and others) were explained to the patient and were aknowledged.

## 2012-05-16 NOTE — Telephone Encounter (Signed)
Misty Stanley, please, inform patient that it would be best to try prednisone again Thx

## 2012-05-16 NOTE — Progress Notes (Signed)
Patient ID: [name], female   DOB: Dec 26, 1932, 78 y.o.   MRN: [id number] Patient ID: [name], female   DOB: 17-May-1933, 78 y.o.   MRN: [id number]  Subjective:    Patient ID: [name], female    DOB: 1933/02/12, 78 y.o.   MRN: [id number]  HPI Ref to [facility] 01/23/12 The patient presents for a follow-up of PMR, chronic hypertension, chronic dyslipidemia, type 2 diabetes. Duragesic was too $$$ Benicar caused nausea C/o B knee pain   The patient is here to follow up on chronic DM2, HTN, headaches and chronic moderate PMR/fibromyalgia symptoms   C/o GERD   BP Readings from Last 3 Encounters:  05/16/12 160/80  05/14/12 174/72  05/05/12 140/80   Wt Readings from Last 3 Encounters:  05/16/12 189 lb (85.73 kg)  05/14/12 187 lb (84.823 kg)  05/05/12 192 lb 12.8 oz (87.454 kg)      Review of Systems  Constitutional: Positive for fatigue. Negative for chills, activity change, appetite change and unexpected weight change.  HENT: Negative for congestion and mouth sores.   Eyes: Negative for visual disturbance.  Respiratory: Negative for chest tightness.   Genitourinary: Negative for frequency, difficulty urinating and vaginal pain.  Musculoskeletal: Positive for arthralgias and gait problem. Negative for myalgias.  Skin: Negative for pallor and rash.  Neurological: Negative for tremors.  Psychiatric/Behavioral: Negative for suicidal ideas, confusion and disturbed wake/sleep cycle. The patient is not nervous/anxious.        Objective:   Physical Exam  Constitutional: She appears well-developed. No distress.  HENT:  Head: Normocephalic.  Right Ear: External ear normal.  Left Ear: External ear normal.  Nose: Nose normal.  Mouth/Throat: Oropharynx is clear and moist.  Eyes: Conjunctivae are normal. Pupils are equal, round, and reactive to light. Right eye exhibits no discharge. Left eye exhibits no discharge.  Neck: Normal range of motion. Neck supple. No JVD present.  No tracheal deviation present. No thyromegaly present.  Cardiovascular: Normal rate, regular rhythm and normal heart sounds.   Pulmonary/Chest: No stridor. No respiratory distress. She has no wheezes.  Abdominal: Soft. Bowel sounds are normal. She exhibits no distension and no mass. There is no tenderness. There is no rebound and no guarding.  Musculoskeletal: She exhibits tenderness (B B anserinae). She exhibits no edema.  Lymphadenopathy:    She has no cervical adenopathy.  Neurological: She displays normal reflexes. No cranial nerve deficit. She exhibits normal muscle tone. Coordination abnormal.  Skin: No rash noted. No erythema.  Psychiatric: She has a normal mood and affect. Her behavior is normal. Judgment and thought content normal.   Lab Results  Component Value Date   WBC 4.8 08/21/2011   HGB 10.2* 08/21/2011   HCT 31.8* 08/21/2011   PLT 189.0 08/21/2011   GLUCOSE 87 12/19/2011   CHOL 214* 01/10/2011   TRIG 75.0 01/10/2011   HDL 82.60 01/10/2011   LDLDIRECT 114.8 01/10/2011   LDLCALC  Value: 106        Total Cholesterol/HDL:CHD Risk Coronary Heart Disease Risk Table                     Men   Women  1/2 Average Risk   3.4   3.3* 07/06/2007   ALT 13 12/19/2011   AST 22 12/19/2011   NA 139 12/19/2011   K 4.8 12/19/2011   CL 107 12/19/2011   CREATININE 1.1 12/19/2011   BUN 30* 12/19/2011   CO2 23 12/19/2011  TSH 1.12 08/21/2011   INR 1.1 07/05/2007   HGBA1C 6.2 12/19/2011           Assessment & Plan:

## 2012-05-19 NOTE — Telephone Encounter (Signed)
Pt informed

## 2012-05-23 ENCOUNTER — Telehealth: Payer: Self-pay

## 2012-05-23 MED ORDER — "SYRINGE/NEEDLE (DISP) 25G X 5/8"" 1 ML MISC": 1.0000 | Status: DC

## 2012-05-23 MED ORDER — CYANOCOBALAMIN 1000 MCG/ML IJ SOLN: INTRAMUSCULAR | Status: DC

## 2012-05-23 NOTE — Telephone Encounter (Signed)
Left detailed mess informing pt of below.  

## 2012-05-23 NOTE — Telephone Encounter (Signed)
Ok Done Thx 

## 2012-05-23 NOTE — Telephone Encounter (Signed)
Pt called stating that AVP discuss pt changing from oral B-12 to injectable at her last OV. Pt states that she prefers to have injection, please advise.

## 2012-05-27 ENCOUNTER — Telehealth: Payer: Self-pay

## 2012-05-27 ENCOUNTER — Ambulatory Visit (INDEPENDENT_AMBULATORY_CARE_PROVIDER_SITE_OTHER): Payer: Medicare HMO

## 2012-05-27 DIAGNOSIS — E538 Deficiency of other specified B group vitamins: Secondary | ICD-10-CM

## 2012-05-27 MED ORDER — TRIAMCINOLONE ACETONIDE 0.1 % EX OINT: TOPICAL_OINTMENT | Freq: Two times a day (BID) | CUTANEOUS | Status: DC

## 2012-05-27 MED ORDER — CYANOCOBALAMIN 1000 MCG/ML IJ SOLN
1000.0000 ug | Freq: Once | INTRAMUSCULAR | Status: AC
  Administered 2012-05-27: 1000 ug via INTRAMUSCULAR

## 2012-05-27 NOTE — Telephone Encounter (Signed)
OK to fill this prescription with additional refills x2 Thank you!  

## 2012-05-27 NOTE — Telephone Encounter (Signed)
Pt is requesting Rx for Triamcinolone Acetonide Ointment 0.1%, please advise.

## 2012-06-06 ENCOUNTER — Telehealth: Payer: Self-pay

## 2012-06-06 NOTE — Telephone Encounter (Signed)
Pt called stating that she would prefer to receive monthly B-12 injection in office. Pt is requesting Rx sent to mail order pharmacy be canceled and she be scheduled for nurse visit. Okay to sch pt and cancel Rx?

## 2012-06-06 NOTE — Telephone Encounter (Signed)
Ok Thx 

## 2012-06-09 NOTE — Telephone Encounter (Signed)
Pt has scheduled nurse visits for B-12 injections. Pt not sure if she will bring in medication already mailed to her by [facility] or if she will use medication at office. [facility] contacted and advised to cancel Rx per pt request. Spoke with [facility] - pharmacist at [facility].

## 2012-06-27 ENCOUNTER — Ambulatory Visit: Payer: Medicare HMO

## 2012-07-02 ENCOUNTER — Ambulatory Visit (INDEPENDENT_AMBULATORY_CARE_PROVIDER_SITE_OTHER): Payer: Medicare HMO | Admitting: General Practice

## 2012-07-02 DIAGNOSIS — E538 Deficiency of other specified B group vitamins: Secondary | ICD-10-CM

## 2012-07-02 MED ORDER — CYANOCOBALAMIN 1000 MCG/ML IJ SOLN
1000.0000 ug | Freq: Once | INTRAMUSCULAR | Status: AC
  Administered 2012-07-02: 1000 ug via INTRAMUSCULAR

## 2012-07-08 ENCOUNTER — Telehealth: Payer: Self-pay

## 2012-07-08 MED ORDER — PROMETHAZINE HCL 25 MG PO TABS: 25.0000 mg | ORAL_TABLET | Freq: Three times a day (TID) | ORAL | Status: DC | PRN

## 2012-07-08 NOTE — Telephone Encounter (Signed)
Pt called c/o severe nausea, no vomiting. Pt is requesting Rx to help with sxs, please advise.

## 2012-07-08 NOTE — Telephone Encounter (Signed)
Promethazine prn - done OV or ER if sick Thx

## 2012-07-09 NOTE — Telephone Encounter (Signed)
Called pt- spoke to a female who states pt is unavailable and hung up. Will try again later

## 2012-07-10 NOTE — Telephone Encounter (Signed)
[name] spouse advised of Rx/pharmacy 

## 2012-08-01 ENCOUNTER — Ambulatory Visit (INDEPENDENT_AMBULATORY_CARE_PROVIDER_SITE_OTHER): Payer: Medicare HMO

## 2012-08-01 DIAGNOSIS — E538 Deficiency of other specified B group vitamins: Secondary | ICD-10-CM

## 2012-08-01 MED ORDER — CYANOCOBALAMIN 1000 MCG/ML IJ SOLN
1000.0000 ug | Freq: Once | INTRAMUSCULAR | Status: AC
  Administered 2012-08-01: 1000 ug via INTRAMUSCULAR

## 2012-08-15 ENCOUNTER — Ambulatory Visit: Payer: Medicare HMO | Admitting: Internal Medicine

## 2012-08-15 DIAGNOSIS — Z0289 Encounter for other administrative examinations: Secondary | ICD-10-CM

## 2012-08-28 ENCOUNTER — Other Ambulatory Visit: Payer: Self-pay | Admitting: *Deleted

## 2012-08-28 MED ORDER — FUROSEMIDE 20 MG PO TABS: 20.0000 mg | ORAL_TABLET | Freq: Every day | ORAL | Status: DC

## 2012-09-01 ENCOUNTER — Ambulatory Visit: Payer: Medicare HMO

## 2012-09-02 ENCOUNTER — Encounter: Payer: Self-pay | Admitting: Internal Medicine

## 2012-09-02 ENCOUNTER — Ambulatory Visit (INDEPENDENT_AMBULATORY_CARE_PROVIDER_SITE_OTHER): Payer: Medicare HMO | Admitting: Internal Medicine

## 2012-09-02 ENCOUNTER — Ambulatory Visit (INDEPENDENT_AMBULATORY_CARE_PROVIDER_SITE_OTHER): Payer: Medicare HMO | Admitting: *Deleted

## 2012-09-02 VITALS — BP 148/80 | HR 80 | Temp 98.6°F | Resp 16 | Wt 189.0 lb

## 2012-09-02 DIAGNOSIS — I10 Essential (primary) hypertension: Secondary | ICD-10-CM

## 2012-09-02 DIAGNOSIS — E538 Deficiency of other specified B group vitamins: Secondary | ICD-10-CM

## 2012-09-02 DIAGNOSIS — M353 Polymyalgia rheumatica: Secondary | ICD-10-CM

## 2012-09-02 DIAGNOSIS — D649 Anemia, unspecified: Secondary | ICD-10-CM

## 2012-09-02 DIAGNOSIS — H9319 Tinnitus, unspecified ear: Secondary | ICD-10-CM

## 2012-09-02 DIAGNOSIS — D638 Anemia in other chronic diseases classified elsewhere: Secondary | ICD-10-CM | POA: Insufficient documentation

## 2012-09-02 DIAGNOSIS — F329 Major depressive disorder, single episode, unspecified: Secondary | ICD-10-CM

## 2012-09-02 DIAGNOSIS — E119 Type 2 diabetes mellitus without complications: Secondary | ICD-10-CM

## 2012-09-02 DIAGNOSIS — B373 Candidiasis of vulva and vagina: Secondary | ICD-10-CM | POA: Insufficient documentation

## 2012-09-02 DIAGNOSIS — J329 Chronic sinusitis, unspecified: Secondary | ICD-10-CM

## 2012-09-02 DIAGNOSIS — F3289 Other specified depressive episodes: Secondary | ICD-10-CM

## 2012-09-02 DIAGNOSIS — B3731 Acute candidiasis of vulva and vagina: Secondary | ICD-10-CM | POA: Insufficient documentation

## 2012-09-02 DIAGNOSIS — Z79899 Other long term (current) drug therapy: Secondary | ICD-10-CM

## 2012-09-02 LAB — HEPATIC FUNCTION PANEL
ALT: 12 U/L (ref 0–35)
AST: 21 U/L (ref 0–37)
Total Bilirubin: 0.5 mg/dL (ref 0.3–1.2)
Total Protein: 8.4 g/dL — ABNORMAL HIGH (ref 6.0–8.3)

## 2012-09-02 LAB — CBC WITH DIFFERENTIAL/PLATELET
Eosinophils Absolute: 0.1 10*3/uL (ref 0.0–0.7)
Eosinophils Relative: 1.8 % (ref 0.0–5.0)
HCT: 31 % — ABNORMAL LOW (ref 36.0–46.0)
Lymphs Abs: 1 10*3/uL (ref 0.7–4.0)
MCHC: 32.3 g/dL (ref 30.0–36.0)
MCV: 90.6 fl (ref 78.0–100.0)
Monocytes Absolute: 0.4 10*3/uL (ref 0.1–1.0)
Neutrophils Relative %: 71.6 % (ref 43.0–77.0)
Platelets: 173 10*3/uL (ref 150.0–400.0)
RDW: 13.8 % (ref 11.5–14.6)

## 2012-09-02 LAB — IBC PANEL
Iron: 72 ug/dL (ref 42–145)
Transferrin: 218.9 mg/dL (ref 212.0–360.0)

## 2012-09-02 LAB — HEMOGLOBIN A1C: Hgb A1c MFr Bld: 6.4 % (ref 4.6–6.5)

## 2012-09-02 MED ORDER — CLOTRIMAZOLE 100 MG VA TABS: 1.0000 | ORAL_TABLET | Freq: Every day | VAGINAL | Status: DC

## 2012-09-02 MED ORDER — CYANOCOBALAMIN 1000 MCG/ML IJ SOLN: INTRAMUSCULAR | Status: DC

## 2012-09-02 MED ORDER — METHYLPREDNISOLONE ACETATE 80 MG/ML IJ SUSP
120.0000 mg | Freq: Once | INTRAMUSCULAR | Status: AC
  Administered 2012-09-02: 120 mg via INTRAMUSCULAR

## 2012-09-02 MED ORDER — DOXYCYCLINE HYCLATE 100 MG PO TABS: 100.0000 mg | ORAL_TABLET | Freq: Two times a day (BID) | ORAL | Status: DC

## 2012-09-02 NOTE — Assessment & Plan Note (Signed)
Depomedrol 120 mg im 

## 2012-09-02 NOTE — Assessment & Plan Note (Signed)
F/u w/ENT 

## 2012-09-02 NOTE — Assessment & Plan Note (Signed)
discussed

## 2012-09-02 NOTE — Assessment & Plan Note (Signed)
Change to twice a month inj

## 2012-09-02 NOTE — Progress Notes (Signed)
Subjective:    Patient ID: Shelby SEBASTIANI, female    DOB: 1933-10-29, 78 y.o.   MRN: [id number]  C/o fatigue, cough, weakness, lack of balance x 2 mo  Otalgia  There is pain in the left ear. The current episode started more than 1 year ago. The problem occurs constantly. The pain is moderate. Pertinent negatives include no rash. Associated symptoms comments: Humming, knocking. The treatment provided no relief. There is no history of a chronic ear infection.  She did see an ENT - she was told it looked nl; head MRI was suggeted  Ref to [facility] 01/23/12  The patient presents for a follow-up of PMR, chronic hypertension, chronic dyslipidemia, type 2 diabetes. Duragesic was too $$$ Benicar caused nausea C/o B knee pain   The patient is here to follow up on chronic DM2, HTN, headaches and chronic moderate PMR/fibromyalgia symptoms   C/o GERD   BP Readings from Last 3 Encounters:  05/16/12 160/80  05/14/12 174/72  05/05/12 140/80   Wt Readings from Last 3 Encounters:  05/16/12 189 lb (85.73 kg)  05/14/12 187 lb (84.823 kg)  05/05/12 192 lb 12.8 oz (87.454 kg)      Review of Systems  Constitutional: Positive for fatigue. Negative for chills, activity change, appetite change and unexpected weight change.  HENT: Positive for ear pain. Negative for congestion and mouth sores.   Eyes: Negative for visual disturbance.  Respiratory: Negative for chest tightness.   Genitourinary: Negative for frequency, difficulty urinating and vaginal pain.  Musculoskeletal: Positive for arthralgias and gait problem. Negative for myalgias.  Skin: Negative for pallor and rash.  Neurological: Negative for tremors.  Psychiatric/Behavioral: Negative for suicidal ideas, confusion and disturbed wake/sleep cycle. The patient is not nervous/anxious.        Objective:   Physical Exam  Constitutional: She appears well-developed. No distress.  HENT:  Head: Normocephalic.  Right Ear: External ear normal.    Left Ear: External ear normal.  Nose: Nose normal.  Mouth/Throat: Oropharynx is clear and moist.  Eyes: Conjunctivae normal are normal. Pupils are equal, round, and reactive to light. Right eye exhibits no discharge. Left eye exhibits no discharge.  Neck: Normal range of motion. Neck supple. No JVD present. No tracheal deviation present. No thyromegaly present.  Cardiovascular: Normal rate, regular rhythm and normal heart sounds.   Pulmonary/Chest: No stridor. No respiratory distress. She has no wheezes.  Abdominal: Soft. Bowel sounds are normal. She exhibits no distension and no mass. There is no tenderness. There is no rebound and no guarding.  Musculoskeletal: She exhibits tenderness (B B anserinae). She exhibits no edema.  Lymphadenopathy:    She has no cervical adenopathy.  Neurological: She displays normal reflexes. No cranial nerve deficit. She exhibits normal muscle tone. Coordination abnormal.  Skin: No rash noted. No erythema.  Psychiatric: She has a normal mood and affect. Her behavior is normal. Judgment and thought content normal.   Lab Results  Component Value Date   WBC 4.5 05/16/2012   HGB 9.8* 05/16/2012   HCT 29.9* 05/16/2012   PLT 187.0 05/16/2012   GLUCOSE 95 05/16/2012   CHOL 214* 01/10/2011   TRIG 75.0 01/10/2011   HDL 82.60 01/10/2011   LDLDIRECT 114.8 01/10/2011   LDLCALC  Value: 106        Total Cholesterol/HDL:CHD Risk Coronary Heart Disease Risk Table                     Men   Women  1/2 Average Risk   3.4   3.3* 07/06/2007   ALT 13 12/19/2011   AST 22 12/19/2011   NA 137 05/16/2012   K 4.3 05/16/2012   CL 103 05/16/2012   CREATININE 1.4* 05/16/2012   BUN 41* 05/16/2012   CO2 22 05/16/2012   TSH 0.96 05/16/2012   INR 1.1 07/05/2007   HGBA1C 6.4 05/16/2012           Assessment & Plan:

## 2012-09-02 NOTE — Assessment & Plan Note (Signed)
Chronic  S/p hem eval and bone marrow bx in 2010 - ok

## 2012-09-02 NOTE — Assessment & Plan Note (Signed)
Continue with current prescription therapy as reflected on the Med list.  

## 2012-09-08 ENCOUNTER — Encounter: Payer: Self-pay | Admitting: Internal Medicine

## 2012-09-09 ENCOUNTER — Ambulatory Visit (INDEPENDENT_AMBULATORY_CARE_PROVIDER_SITE_OTHER): Payer: Medicare HMO

## 2012-09-09 DIAGNOSIS — Z23 Encounter for immunization: Secondary | ICD-10-CM

## 2012-09-18 ENCOUNTER — Emergency Department (HOSPITAL_COMMUNITY): Payer: Medicare HMO

## 2012-09-18 ENCOUNTER — Encounter (HOSPITAL_COMMUNITY): Payer: Self-pay | Admitting: *Deleted

## 2012-09-18 ENCOUNTER — Emergency Department (HOSPITAL_COMMUNITY)
Admission: EM | Admit: 2012-09-18 | Discharge: 2012-09-18 | Disposition: A | Discharge status: Home / Self Care | Payer: Medicare HMO | Attending: Emergency Medicine | Admitting: Emergency Medicine

## 2012-09-18 DIAGNOSIS — Z86718 Personal history of other venous thrombosis and embolism: Secondary | ICD-10-CM | POA: Insufficient documentation

## 2012-09-18 DIAGNOSIS — F329 Major depressive disorder, single episode, unspecified: Secondary | ICD-10-CM | POA: Insufficient documentation

## 2012-09-18 DIAGNOSIS — I10 Essential (primary) hypertension: Secondary | ICD-10-CM | POA: Insufficient documentation

## 2012-09-18 DIAGNOSIS — I2510 Atherosclerotic heart disease of native coronary artery without angina pectoris: Secondary | ICD-10-CM | POA: Insufficient documentation

## 2012-09-18 DIAGNOSIS — K859 Acute pancreatitis without necrosis or infection, unspecified: Secondary | ICD-10-CM | POA: Insufficient documentation

## 2012-09-18 DIAGNOSIS — Z79899 Other long term (current) drug therapy: Secondary | ICD-10-CM | POA: Insufficient documentation

## 2012-09-18 DIAGNOSIS — E785 Hyperlipidemia, unspecified: Secondary | ICD-10-CM | POA: Insufficient documentation

## 2012-09-18 DIAGNOSIS — K219 Gastro-esophageal reflux disease without esophagitis: Secondary | ICD-10-CM | POA: Insufficient documentation

## 2012-09-18 DIAGNOSIS — M130 Polyarthritis, unspecified: Secondary | ICD-10-CM | POA: Insufficient documentation

## 2012-09-18 DIAGNOSIS — D509 Iron deficiency anemia, unspecified: Secondary | ICD-10-CM | POA: Insufficient documentation

## 2012-09-18 DIAGNOSIS — E559 Vitamin D deficiency, unspecified: Secondary | ICD-10-CM | POA: Insufficient documentation

## 2012-09-18 DIAGNOSIS — F3289 Other specified depressive episodes: Secondary | ICD-10-CM | POA: Insufficient documentation

## 2012-09-18 DIAGNOSIS — H81399 Other peripheral vertigo, unspecified ear: Secondary | ICD-10-CM | POA: Insufficient documentation

## 2012-09-18 DIAGNOSIS — Z7982 Long term (current) use of aspirin: Secondary | ICD-10-CM | POA: Insufficient documentation

## 2012-09-18 DIAGNOSIS — E119 Type 2 diabetes mellitus without complications: Secondary | ICD-10-CM | POA: Insufficient documentation

## 2012-09-18 DIAGNOSIS — F411 Generalized anxiety disorder: Secondary | ICD-10-CM | POA: Insufficient documentation

## 2012-09-18 HISTORY — DX: Dizziness and giddiness: R42

## 2012-09-18 HISTORY — DX: Other chronic pain: G89.29

## 2012-09-18 HISTORY — DX: Dorsalgia, unspecified: M54.9

## 2012-09-18 LAB — CBC WITH DIFFERENTIAL/PLATELET
Eosinophils Absolute: 0 10*3/uL (ref 0.0–0.7)
Eosinophils Relative: 1 % (ref 0–5)
HCT: 31.5 % — ABNORMAL LOW (ref 36.0–46.0)
Hemoglobin: 10.3 g/dL — ABNORMAL LOW (ref 12.0–15.0)
Lymphs Abs: 1 10*3/uL (ref 0.7–4.0)
MCH: 29.4 pg (ref 26.0–34.0)
MCHC: 32.7 g/dL (ref 30.0–36.0)
MCV: 90 fL (ref 78.0–100.0)
Monocytes Absolute: 0.4 10*3/uL (ref 0.1–1.0)
Monocytes Relative: 6 % (ref 3–12)
Neutrophils Relative %: 80 % — ABNORMAL HIGH (ref 43–77)
RBC: 3.5 MIL/uL — ABNORMAL LOW (ref 3.87–5.11)

## 2012-09-18 LAB — BASIC METABOLIC PANEL
BUN: 37 mg/dL — ABNORMAL HIGH (ref 6–23)
CO2: 24 mEq/L (ref 19–32)
Chloride: 99 mEq/L (ref 96–112)
Glucose, Bld: 136 mg/dL — ABNORMAL HIGH (ref 70–99)
Potassium: 4.5 mEq/L (ref 3.5–5.1)
Sodium: 137 mEq/L (ref 135–145)

## 2012-09-18 LAB — URINALYSIS, ROUTINE W REFLEX MICROSCOPIC
Glucose, UA: NEGATIVE mg/dL
Hgb urine dipstick: NEGATIVE
Ketones, ur: NEGATIVE mg/dL
Leukocytes, UA: NEGATIVE
pH: 6.5 (ref 5.0–8.0)

## 2012-09-18 LAB — GLUCOSE, CAPILLARY: Glucose-Capillary: 111 mg/dL — ABNORMAL HIGH (ref 70–99)

## 2012-09-18 MED ORDER — SODIUM CHLORIDE 0.9 % IV SOLN
INTRAVENOUS | Status: DC
  Administered 2012-09-18: 21:00:00 via INTRAVENOUS

## 2012-09-18 MED ORDER — ONDANSETRON HCL 4 MG/2ML IJ SOLN
4.0000 mg | INTRAMUSCULAR | Status: DC | PRN
  Filled 2012-09-18: qty 2

## 2012-09-18 MED ORDER — MECLIZINE HCL 25 MG PO TABS
25.0000 mg | ORAL_TABLET | Freq: Once | ORAL | Status: AC
  Administered 2012-09-18: 25 mg via ORAL
  Filled 2012-09-18: qty 1

## 2012-09-18 MED ORDER — MECLIZINE HCL 25 MG PO TABS: 25.0000 mg | ORAL_TABLET | Freq: Two times a day (BID) | ORAL | Status: DC | PRN

## 2012-09-18 MED ORDER — ONDANSETRON HCL 4 MG PO TABS: 4.0000 mg | ORAL_TABLET | Freq: Three times a day (TID) | ORAL | Status: DC | PRN

## 2012-09-18 NOTE — ED Provider Notes (Signed)
History     CSN: [id number]  Arrival date & time 09/18/12  1654   First MD Initiated Contact with Patient 09/18/12 1849      Chief Complaint  Patient presents with  . Dizziness     HPI Pt was seen at 1925.  Per pt, c/o sudden onset and persistence of multiple intermittent episodes of "dizziness" that began last night.  Pt describes her symptoms as "everything was spinning," worsens with changes in position. Has been associated with several episodes N/V. Pt endorses hx of same symptoms, dx vertigo, but "they didn't give me any meds for it."  Denies visual changes, no facial droop, no focal motor weakness, no tingling/numbness in extremities, no fevers, no rash, no CP/palpitations, no SOB/cough, no abd pain.    Past Medical History  Diagnosis Date  . CAD (coronary artery disease)     s/p stenting of LAD 1999- cath 5-08 EF normal LAD 30-40% restenosis. D1 50% D2 80% LCX & RCA minimal plaque  . HTN (hypertension)   . Hyperlipemia   . Anemia     iron deficiency  . Depression   . DVT (deep venous thrombosis)   . Gout   . Osteoporosis   . Pancreatitis   . GERD (gastroesophageal reflux disease)   . Renal insufficiency     Cr 1.2-1.3  . Obesity   . Diabetes mellitus   . Polyarthritis     DJD/ possible PMR  . Vitamin D deficiency   . B12 deficiency   . Tinnitus   . Anxiety   . Aneurysm, thoracic aortic   . Constipation   . Urinary frequency   . Vertigo   . Chronic back pain     Past Surgical History  Procedure Date  . Hemorrhoid surgery   . Abdominal hysterectomy   . Taxus stent   . Cholecystectomy   . Tubal ligation     Family History  Problem Relation Age of Onset  . Adopted: Yes  . Diabetes Mother   . Hypertension Father     History  Substance Use Topics  . Smoking status: Never Smoker   . Smokeless tobacco: Not on file  . Alcohol Use: No    Review of Systems ROS: Statement: All systems negative except as marked or noted in the HPI; Constitutional:  Negative for fever and chills. ; ; Eyes: Negative for eye pain, redness and discharge. ; ; ENMT: Negative for ear pain, hoarseness, nasal congestion, sinus pressure and sore throat. ; ; Cardiovascular: Negative for chest pain, palpitations, diaphoresis, dyspnea and peripheral edema. ; ; Respiratory: Negative for cough, wheezing and stridor. ; ; Gastrointestinal: +N/V. Negative for diarrhea, abdominal pain, blood in stool, hematemesis, jaundice and rectal bleeding. . ; ; Genitourinary: Negative for dysuria, flank pain and hematuria. ; ; Musculoskeletal: Negative for back pain and neck pain. Negative for swelling and trauma.; ; Skin: Negative for pruritus, rash, abrasions, blisters, bruising and skin lesion.; ; Neuro: +vertigo. Negative for headache, lightheadedness and neck stiffness. Negative for weakness, altered level of consciousness , altered mental status, extremity weakness, paresthesias, involuntary movement, seizure and syncope.       Allergies  Amlodipine besylate; Aspirin; Atenolol; Benazepril; Benicar; Cozaar; Hydrochlorothiazide w-triamterene; Hydrocodone; Hydroxyzine pamoate; Iodine; Lisinopril; Penicillins; Prednisolone; and Tramadol hcl  Home Medications   Current Outpatient Rx  Name  Route  Sig  Dispense  Refill  . ASPIRIN 325 MG PO TABS   Oral   Take 325 mg by mouth daily.         [name]  VITAMIN D3 1000 UNITS PO TABS   Oral   Take 1,000 Units by mouth daily.         [name] CLOTRIMAZOLE 100 MG VA TABS   Vaginal   Place 1 tablet vaginally at bedtime.         . CYANOCOBALAMIN 1000 MCG/ML IJ SOLN      1 ml sq q 2 wks         . DOXYCYCLINE HYCLATE 100 MG PO TABS   Oral   Take 100 mg by mouth 2 (two) times daily.         . FUROSEMIDE 20 MG PO TABS   Oral   Take 1 tablet (20 mg total) by mouth daily.   90 tablet   1   . GABAPENTIN 100 MG PO CAPS   Oral   Take 100 mg by mouth 3 (three) times daily.         [name] GLUCOSE BLOOD VI STRP   Other   1 each by Other route  daily.         [name] LANCETS MISC   Does not apply   1 each by Does not apply route daily. Please dispense lancets -OneTouch Ultra. Dx:250.00   100 each   1   . METFORMIN HCL 500 MG PO TABS   Oral   Take 500 mg by mouth 2 (two) times daily with a meal.         . OMEPRAZOLE 40 MG PO CPDR   Oral   Take 40 mg by mouth daily.         . OXYBUTYNIN CHLORIDE 5 MG PO TABS   Oral   Take 5 mg by mouth 3 (three) times daily. Dysuria         . PRAVASTATIN SODIUM 20 MG PO TABS   Oral   Take 20 mg by mouth every evening.         [name] SYRINGE/NEEDLE (DISP) 25G X 5/8" 1 ML MISC   Does not apply   1 each by Does not apply route every 30 (thirty) days.         . TRIAMCINOLONE ACETONIDE 0.1 % EX OINT   Topical   Apply 1 application topically 2 (two) times daily.         . ASPIRIN EC 325 MG PO TBEC   Oral   Take 325 mg by mouth daily.           [name] PROMETHAZINE HCL 25 MG PO TABS   Oral   Take 25 mg by mouth every 8 (eight) hours as needed.           BP 181/69  Pulse 72  Temp 97.5 F (36.4 C) (Oral)  Resp 16  SpO2 99%  Physical Exam 1930: Physical examination:  Nursing notes reviewed; Vital signs and O2 SAT reviewed;  Constitutional: Well developed, Well nourished, Well hydrated, In no acute distress; Head:  Normocephalic, atraumatic; Eyes: EOMI, PERRL, No scleral icterus; ENMT: TM's clear bilat.  +edemetous nasal turbinates bilat with clear rhinorrhea.  Mouth and pharynx normal, Mucous membranes moist; Neck: Supple, Full range of motion, No lymphadenopathy; Cardiovascular: Regular rate and rhythm, No gallop; Respiratory: Breath sounds clear & equal bilaterally, No rales, rhonchi, wheezes.  Speaking full sentences with ease, Normal respiratory effort/excursion; Chest: Nontender, Movement normal; Abdomen: Soft, Nontender, Nondistended, Normal bowel sounds;; Extremities: Pulses normal, No tenderness, No edema, No calf edema or asymmetry.; Neuro: AA&Ox3, Major CN grossly intact.   Strength  5/5 equal bilat UE's and LE's.  DTR 2/4 equal bilat UE's and LE's.  No gross sensory deficits.  Normal cerebellar testing bilat UE's (finger-nose) and LE's (heel-shin). Speech clear.  No facial droop.  +right horizontal gaze fatigable nystagmus.;; Skin: Color normal, Warm, Dry.   ED Course  Procedures    MDM  MDM Reviewed: previous chart, nursing note and vitals Reviewed previous: ECG and labs Interpretation: labs, ECG, CT scan and x-ray    Date: 09/18/2012  Rate: 67  Rhythm: normal sinus rhythm  QRS Axis: normal  Intervals: normal  ST/T Wave abnormalities: normal  Conduction Disutrbances:none  Narrative Interpretation:   Old EKG Reviewed: unchanged; no significant changes from previous EKG dated 09/07/2011.  Results for orders placed during the hospital encounter of 09/18/12  BASIC METABOLIC PANEL      Component Value Range   Sodium 137  135 - 145 mEq/L   Potassium 4.5  3.5 - 5.1 mEq/L   Chloride 99  96 - 112 mEq/L   CO2 24  19 - 32 mEq/L   Glucose, Bld 136 (*) 70 - 99 mg/dL   BUN 37 (*) 6 - 23 mg/dL   Creatinine, Ser 1.61 (*) 0.50 - 1.10 mg/dL   Calcium 9.8  8.4 - 09.6 mg/dL   GFR calc non Af Amer 32 (*) >90 mL/min   GFR calc Af Amer 37 (*) >90 mL/min  CBC WITH DIFFERENTIAL      Component Value Range   WBC 7.6  4.0 - 10.5 K/uL   RBC 3.50 (*) 3.87 - 5.11 MIL/uL   Hemoglobin 10.3 (*) 12.0 - 15.0 g/dL   HCT 04.5 (*) 40.9 - 81.1 %   MCV 90.0  78.0 - 100.0 fL   MCH 29.4  26.0 - 34.0 pg   MCHC 32.7  30.0 - 36.0 g/dL   RDW 91.4  78.2 - 95.6 %   Platelets 197  150 - 400 K/uL   Neutrophils Relative 80 (*) 43 - 77 %   Neutro Abs 6.1  1.7 - 7.7 K/uL   Lymphocytes Relative 14  12 - 46 %   Lymphs Abs 1.0  0.7 - 4.0 K/uL   Monocytes Relative 6  3 - 12 %   Monocytes Absolute 0.4  0.1 - 1.0 K/uL   Eosinophils Relative 1  0 - 5 %   Eosinophils Absolute 0.0  0.0 - 0.7 K/uL   Basophils Relative 0  0 - 1 %   Basophils Absolute 0.0  0.0 - 0.1 K/uL  TROPONIN I       Component Value Range   Troponin I <0.30  <0.30 ng/mL  URINALYSIS, ROUTINE W REFLEX MICROSCOPIC      Component Value Range   Color, Urine YELLOW  YELLOW   APPearance CLEAR  CLEAR   Specific Gravity, Urine 1.008  1.005 - 1.030   pH 6.5  5.0 - 8.0   Glucose, UA NEGATIVE  NEGATIVE mg/dL   Hgb urine dipstick NEGATIVE  NEGATIVE   Bilirubin Urine NEGATIVE  NEGATIVE   Ketones, ur NEGATIVE  NEGATIVE mg/dL   Protein, ur NEGATIVE  NEGATIVE mg/dL   Urobilinogen, UA 1.0  0.0 - 1.0 mg/dL   Nitrite NEGATIVE  NEGATIVE   Leukocytes, UA NEGATIVE  NEGATIVE  GLUCOSE, CAPILLARY      Component Value Range   Glucose-Capillary 111 (*) 70 - 99 mg/dL   Dg Chest 2 View 21/01/864  *RADIOLOGY REPORT*  Clinical Data: Shortness of breath and cough.  CHEST -  2 VIEW  Comparison: 05/10/2010 and 12/07/2007  Findings: The heart size and pulmonary vascularity are normal and the lungs are clear.  No significant osseous abnormality.  IMPRESSION: No acute abnormalities.   Original Report Authenticated By: Francene Boyers, M.D.    Ct Head Wo Contrast 09/18/2012  *RADIOLOGY REPORT*  Clinical Data: Dizziness.  CT HEAD WITHOUT CONTRAST  Technique:  Contiguous axial images were obtained from the base of the skull through the vertex without contrast.  Comparison: September 04, 2011.  Findings: Bony calvarium appears intact.  Mild diffuse cortical atrophy is noted.  No mass effect or midline shift is noted. Ventricular size is within normal limits.  There is no evidence of mass lesion, hemorrhage or acute infarction.  IMPRESSION: Mild diffuse cortical atrophy.  No acute intracranial abnormality seen.   Original Report Authenticated By: Lupita Raider.,  M.D.    Results for SHARICE, HARRISS (MRN [id number]) as of 09/18/2012 23:28  Ref. Range 08/21/2011 14:11 12/19/2011 08:46 05/16/2012 15:04 09/18/2012 20:45  BUN Latest Range: 6-23 mg/dL 35 (H) 30 (H) 41 (H) 37 (H)  Creatinine Latest Range: 0.50-1.10 mg/dL 1.3 (H) 1.1 1.4 (H) 4.09 (H)     Results for GERALDEAN, WALEN (MRN [id number]) as of 09/18/2012 23:28  Ref. Range 08/21/2011 14:11 05/16/2012 15:04 09/02/2012 12:10 09/18/2012 20:45  Hemoglobin Latest Range: 12.0-15.0 g/dL 95.6 (L) 9.8 (L) 21.3 (L) 10.3 (L)  HCT Latest Range: 36.0-46.0 % 31.8 (L) 29.9 (L) 31.0 (L) 31.5 (L)      2320:  Pt is not orthostatic.  Pt has ambulated in the hallways with steady gait, easy resps.  Has tol PO well without N/V.  Pt states she feels "much better" after meds, and wants to go home now. Dx and testing d/w pt and family.  Questions answered.  Verb understanding, agreeable to d/c home with outpt f/u.          Laray Anger, DO 09/20/12 2036

## 2012-09-18 NOTE — ED Notes (Addendum)
Patient is from home. EMS called on respiratory distress. Pt was tachypenic on arrival, pt states she vomited prior to calling EMS. EMS stated patient had a fight with spouse and became severely anxious.

## 2012-09-18 NOTE — ED Notes (Signed)
Patient ambulated and states she "feels better than she did coming in."

## 2012-09-20 LAB — URINE CULTURE: Colony Count: 25000

## 2012-10-17 ENCOUNTER — Ambulatory Visit: Payer: Medicare HMO | Admitting: *Deleted

## 2012-10-17 DIAGNOSIS — E538 Deficiency of other specified B group vitamins: Secondary | ICD-10-CM

## 2012-10-17 MED ORDER — CYANOCOBALAMIN 1000 MCG/ML IJ SOLN: 1000.0000 ug | Freq: Once | INTRAMUSCULAR | Status: DC

## 2012-10-17 MED ORDER — CYANOCOBALAMIN 1000 MCG/ML IJ SOLN
1000.0000 ug | Freq: Once | INTRAMUSCULAR | Status: AC
  Administered 2012-10-17: 1000 ug via INTRAMUSCULAR

## 2012-12-04 ENCOUNTER — Ambulatory Visit: Payer: Medicare HMO | Admitting: Internal Medicine

## 2012-12-09 ENCOUNTER — Ambulatory Visit: Payer: Medicare HMO | Admitting: Internal Medicine

## 2013-01-05 ENCOUNTER — Ambulatory Visit: Payer: Medicare HMO | Admitting: Internal Medicine

## 2013-01-19 ENCOUNTER — Ambulatory Visit (INDEPENDENT_AMBULATORY_CARE_PROVIDER_SITE_OTHER): Payer: Medicare HMO | Admitting: Internal Medicine

## 2013-01-19 ENCOUNTER — Encounter: Payer: Self-pay | Admitting: Internal Medicine

## 2013-01-19 ENCOUNTER — Other Ambulatory Visit (INDEPENDENT_AMBULATORY_CARE_PROVIDER_SITE_OTHER): Payer: Medicare HMO

## 2013-01-19 VITALS — BP 150/85 | HR 72 | Temp 98.1°F | Resp 16 | Wt 186.0 lb

## 2013-01-19 DIAGNOSIS — H9319 Tinnitus, unspecified ear: Secondary | ICD-10-CM

## 2013-01-19 DIAGNOSIS — N259 Disorder resulting from impaired renal tubular function, unspecified: Secondary | ICD-10-CM

## 2013-01-19 DIAGNOSIS — R5382 Chronic fatigue, unspecified: Secondary | ICD-10-CM

## 2013-01-19 DIAGNOSIS — R5383 Other fatigue: Secondary | ICD-10-CM

## 2013-01-19 DIAGNOSIS — G9332 Myalgic encephalomyelitis/chronic fatigue syndrome: Secondary | ICD-10-CM

## 2013-01-19 DIAGNOSIS — R5381 Other malaise: Secondary | ICD-10-CM

## 2013-01-19 DIAGNOSIS — E538 Deficiency of other specified B group vitamins: Secondary | ICD-10-CM

## 2013-01-19 DIAGNOSIS — I2510 Atherosclerotic heart disease of native coronary artery without angina pectoris: Secondary | ICD-10-CM

## 2013-01-19 DIAGNOSIS — M109 Gout, unspecified: Secondary | ICD-10-CM

## 2013-01-19 DIAGNOSIS — M353 Polymyalgia rheumatica: Secondary | ICD-10-CM

## 2013-01-19 DIAGNOSIS — E119 Type 2 diabetes mellitus without complications: Secondary | ICD-10-CM

## 2013-01-19 LAB — CBC WITH DIFFERENTIAL/PLATELET
Basophils Relative: 0.3 % (ref 0.0–3.0)
Eosinophils Relative: 1.9 % (ref 0.0–5.0)
HCT: 28.7 % — ABNORMAL LOW (ref 36.0–46.0)
Hemoglobin: 9.5 g/dL — ABNORMAL LOW (ref 12.0–15.0)
Lymphs Abs: 1 10*3/uL (ref 0.7–4.0)
MCV: 89.5 fl (ref 78.0–100.0)
Monocytes Absolute: 0.4 10*3/uL (ref 0.1–1.0)
Monocytes Relative: 7.7 % (ref 3.0–12.0)
Neutro Abs: 4.2 10*3/uL (ref 1.4–7.7)
Platelets: 210 10*3/uL (ref 150.0–400.0)
WBC: 5.8 10*3/uL (ref 4.5–10.5)

## 2013-01-19 LAB — BASIC METABOLIC PANEL
BUN: 36 mg/dL — ABNORMAL HIGH (ref 6–23)
CO2: 24 mEq/L (ref 19–32)
Calcium: 9.1 mg/dL (ref 8.4–10.5)
Creatinine, Ser: 1.2 mg/dL (ref 0.4–1.2)
GFR: 56.45 mL/min — ABNORMAL LOW (ref >60.00)
Glucose, Bld: 93 mg/dL (ref 70–99)
Sodium: 141 mEq/L (ref 135–145)

## 2013-01-19 MED ORDER — VITAMIN B-12 1000 MCG SL SUBL: 1.0000 | SUBLINGUAL_TABLET | Freq: Every day | SUBLINGUAL | Status: DC

## 2013-01-19 MED ORDER — METHYLPREDNISOLONE ACETATE 80 MG/ML IJ SUSP
80.0000 mg | Freq: Once | INTRAMUSCULAR | Status: AC
  Administered 2013-01-19: 80 mg via INTRAMUSCULAR

## 2013-01-19 MED ORDER — CYANOCOBALAMIN 1000 MCG/ML IJ SOLN
1000.0000 ug | Freq: Once | INTRAMUSCULAR | Status: AC
  Administered 2013-01-19: 1000 ug via INTRAMUSCULAR

## 2013-01-19 NOTE — Assessment & Plan Note (Signed)
Continue with current prescription therapy as reflected on the Med list.  

## 2013-01-19 NOTE — Assessment & Plan Note (Addendum)
No change 

## 2013-01-19 NOTE — Progress Notes (Signed)
Subjective:     C/o fatigue, cough, weakness, lack of balance x months. C/o heavy eyelids... C/o low CBGs -- 90 this am; 74-79 before  Otalgia  There is pain in the left ear. The current episode started more than 1 year ago. The problem occurs constantly. The pain is moderate. Pertinent negatives include no rash. Associated symptoms comments: Humming, knocking. The treatment provided no relief. There is no history of a chronic ear infection.  She did see an ENT - she was told it looked nl; head MRI was suggeted  Ref to [facility] 01/23/12  The patient presents for a follow-up of PMR, chronic hypertension, chronic dyslipidemia, type 2 diabetes. Duragesic was too $$$ Benicar caused nausea C/o B knee pain   The patient is here to follow up on chronic DM2, HTN, headaches and chronic moderate PMR/fibromyalgia symptoms   C/o GERD   BP Readings from Last 3 Encounters:  01/19/13 184/90  09/18/12 181/69  09/02/12 148/80   Wt Readings from Last 3 Encounters:  01/19/13 186 lb (84.369 kg)  09/02/12 189 lb (85.73 kg)  05/16/12 189 lb (85.73 kg)      Review of Systems  Constitutional: Positive for fatigue. Negative for chills, activity change, appetite change and unexpected weight change.  HENT: Positive for ear pain. Negative for congestion and mouth sores.   Eyes: Negative for visual disturbance.  Respiratory: Negative for chest tightness.   Genitourinary: Negative for frequency, difficulty urinating and vaginal pain.  Musculoskeletal: Positive for arthralgias and gait problem. Negative for myalgias.  Skin: Negative for pallor and rash.  Neurological: Negative for tremors.  Psychiatric/Behavioral: Negative for suicidal ideas, confusion and sleep disturbance. The patient is not nervous/anxious.        Objective:   Physical Exam  Constitutional: She appears well-developed. No distress.  HENT:  Head: Normocephalic.  Right Ear: External ear normal.  Left Ear: External ear normal.   Nose: Nose normal.  Mouth/Throat: Oropharynx is clear and moist.  Eyes: Conjunctivae are normal. Pupils are equal, round, and reactive to light. Right eye exhibits no discharge. Left eye exhibits no discharge.  Neck: Normal range of motion. Neck supple. No JVD present. No tracheal deviation present. No thyromegaly present.  Cardiovascular: Normal rate, regular rhythm and normal heart sounds.   Pulmonary/Chest: No stridor. No respiratory distress. She has no wheezes.  Abdominal: Soft. Bowel sounds are normal. She exhibits no distension and no mass. There is no tenderness. There is no rebound and no guarding.  Musculoskeletal: She exhibits tenderness (B B anserinae). She exhibits no edema.  Lymphadenopathy:    She has no cervical adenopathy.  Neurological: She displays normal reflexes. No cranial nerve deficit. She exhibits normal muscle tone. Coordination abnormal.  Skin: No rash noted. No erythema.  Psychiatric: She has a normal mood and affect. Her behavior is normal. Judgment and thought content normal.   Lab Results  Component Value Date   WBC 7.6 09/18/2012   HGB 10.3* 09/18/2012   HCT 31.5* 09/18/2012   PLT 197 09/18/2012   GLUCOSE 136* 09/18/2012   CHOL 214* 01/10/2011   TRIG 75.0 01/10/2011   HDL 82.60 01/10/2011   LDLDIRECT 114.8 01/10/2011   LDLCALC  Value: 106        Total Cholesterol/HDL:CHD Risk Coronary Heart Disease Risk Table                     Men   Women  1/2 Average Risk   3.4   3.3* 07/06/2007  ALT 12 09/02/2012   AST 21 09/02/2012   NA 137 09/18/2012   K 4.5 09/18/2012   CL 99 09/18/2012   CREATININE 1.51* 09/18/2012   BUN 37* 09/18/2012   CO2 24 09/18/2012   TSH 1.74 09/02/2012   INR 1.1 07/05/2007   HGBA1C 6.4 09/02/2012    A very complex test       Assessment & Plan:

## 2013-01-19 NOTE — Assessment & Plan Note (Signed)
Long term - multifactorial CFS

## 2013-01-19 NOTE — Assessment & Plan Note (Signed)
Watching  

## 2013-01-19 NOTE — Assessment & Plan Note (Addendum)
IM injections - too $$ Will try SL tabs

## 2013-01-19 NOTE — Assessment & Plan Note (Signed)
Discussed.

## 2013-01-19 NOTE — Assessment & Plan Note (Addendum)
Long term - multifactorial CFS. I ordered myasthenia gravis lab Other labs

## 2013-01-19 NOTE — Assessment & Plan Note (Signed)
Meds prn 

## 2013-01-20 ENCOUNTER — Telehealth: Payer: Self-pay | Admitting: Internal Medicine

## 2013-01-20 NOTE — Telephone Encounter (Signed)
Pt informed

## 2013-01-20 NOTE — Telephone Encounter (Signed)
[name], please, inform patient that all labs are similar to previous Thx

## 2013-03-03 ENCOUNTER — Ambulatory Visit (INDEPENDENT_AMBULATORY_CARE_PROVIDER_SITE_OTHER): Payer: Medicare HMO | Admitting: Internal Medicine

## 2013-03-03 ENCOUNTER — Other Ambulatory Visit (INDEPENDENT_AMBULATORY_CARE_PROVIDER_SITE_OTHER): Payer: Medicare HMO

## 2013-03-03 ENCOUNTER — Ambulatory Visit (INDEPENDENT_AMBULATORY_CARE_PROVIDER_SITE_OTHER)
Admission: RE | Admit: 2013-03-03 | Discharge: 2013-03-03 | Disposition: A | Discharge status: Home / Self Care | Payer: Medicare HMO | Source: Ambulatory Visit | Attending: Internal Medicine | Admitting: Internal Medicine

## 2013-03-03 ENCOUNTER — Encounter: Payer: Self-pay | Admitting: Internal Medicine

## 2013-03-03 VITALS — BP 138/72 | HR 76 | Temp 99.0°F | Ht 64.0 in | Wt 180.0 lb

## 2013-03-03 DIAGNOSIS — M25562 Pain in left knee: Secondary | ICD-10-CM

## 2013-03-03 DIAGNOSIS — E538 Deficiency of other specified B group vitamins: Secondary | ICD-10-CM

## 2013-03-03 DIAGNOSIS — M25569 Pain in unspecified knee: Secondary | ICD-10-CM

## 2013-03-03 DIAGNOSIS — F329 Major depressive disorder, single episode, unspecified: Secondary | ICD-10-CM

## 2013-03-03 LAB — CBC
MCHC: 33.1 g/dL (ref 30.0–36.0)
MCV: 90.9 fl (ref 78.0–100.0)
RDW: 13.1 % (ref 11.5–14.6)

## 2013-03-03 MED ORDER — CYANOCOBALAMIN 1000 MCG/ML IJ SOLN
1000.0000 ug | Freq: Once | INTRAMUSCULAR | Status: AC
  Administered 2013-03-03: 1000 ug via INTRAMUSCULAR

## 2013-03-03 MED ORDER — FLUOXETINE HCL 10 MG PO CAPS: 10.0000 mg | ORAL_CAPSULE | Freq: Every day | ORAL | Status: DC

## 2013-03-03 MED ORDER — METHYLPREDNISOLONE ACETATE 80 MG/ML IJ SUSP
80.0000 mg | Freq: Once | INTRAMUSCULAR | Status: AC
  Administered 2013-03-03: 80 mg via INTRAMUSCULAR

## 2013-03-04 ENCOUNTER — Encounter: Payer: Self-pay | Admitting: Internal Medicine

## 2013-03-04 NOTE — Assessment & Plan Note (Signed)
Pt would like to start antidepressant therapy at this time Discussed risk and benefits eRx for Prozac 10 mg daily Pt declined therapy at this time  RTC in 1 month to evaluate effectiveness of medication therapy

## 2013-03-04 NOTE — Assessment & Plan Note (Signed)
b12 shot today. 
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
## 2013-03-04 NOTE — Patient Instructions (Signed)
RICE: Routine Care for Injuries The routine care of many injuries includes Rest, Ice, Compression, and Elevation (RICE). HOME CARE INSTRUCTIONS  Rest is needed to allow your body to heal. Routine activities can usually be resumed when comfortable. Injured tendons and bones can take up to 6 weeks to heal. Tendons are the cord-like structures that attach muscle to bone.  Ice following an injury helps keep the swelling down and reduces pain.  Put ice in a plastic bag.  Place a towel between your skin and the bag.  Leave the ice on for 15 to 20 minutes, 3 to 4 times a day. Do this while awake, for the first 24 to 48 hours. After that, continue as directed by your caregiver.  Compression helps keep swelling down. It also gives support and helps with discomfort. If an elastic bandage has been applied, it should be removed and reapplied every 3 to 4 hours. It should not be applied tightly, but firmly enough to keep swelling down. Watch fingers or toes for swelling, bluish discoloration, coldness, numbness, or excessive pain. If any of these problems occur, remove the bandage and reapply loosely. Contact your caregiver if these problems continue.  Elevation helps reduce swelling and decreases pain. With extremities, such as the arms, hands, legs, and feet, the injured area should be placed near or above the level of the heart, if possible. SEEK IMMEDIATE MEDICAL CARE IF:  You have persistent pain and swelling.  You develop redness, numbness, or unexpected weakness.  Your symptoms are getting worse rather than improving after several days. These symptoms may indicate that further evaluation or further X-rays are needed. Sometimes, X-rays may not show a small broken bone (fracture) until 1 week or 10 days later. Make a follow-up appointment with your caregiver. Ask when your X-ray results will be ready. Make sure you get your X-ray results. Document Released: 02/10/2001 Document Revised: 01/21/2012  Document Reviewed: 03/30/2011 [facility] Patient Information 2013 [place], [place]. Depression, Adult Depression refers to feeling sad, low, down in the dumps, blue, gloomy, or empty. In general, there are two kinds of depression: 1. Depression that we all experience from time to time because of upsetting life experiences, including the loss of a job or the ending of a relationship (normal sadness or normal grief). This kind of depression is considered normal, is short lived, and resolves within a few days to 2 weeks. (Depression experienced after the loss of a loved one is called bereavement. Bereavement often lasts longer than 2 weeks but normally gets better with time.) 2. Clinical depression, which lasts longer than normal sadness or normal grief or interferes with your ability to function at home, at work, and in school. It also interferes with your personal relationships. It affects almost every aspect of your life. Clinical depression is an illness. Symptoms of depression also can be caused by conditions other than normal sadness and grief or clinical depression. Examples of these conditions are listed as follows:  Physical illness Some physical illnesses, including underactive thyroid gland (hypothyroidism), severe anemia, specific types of cancer, diabetes, uncontrolled seizures, heart and lung problems, strokes, and chronic pain are commonly associated with symptoms of depression.  Side effects of some prescription medicine In some people, certain types of prescription medicine can cause symptoms of depression.  Substance abuse Abuse of alcohol and illicit drugs can cause symptoms of depression. SYMPTOMS Symptoms of normal sadness and normal grief include the following:  Feeling sad or crying for short periods of time.  Not  caring about anything (apathy).  Difficulty sleeping or sleeping too much.  No longer able to enjoy the things you used to enjoy.  Desire to be by oneself all the  time (social isolation).  Lack of energy or motivation.  Difficulty concentrating or remembering.  Change in appetite or weight.  Restlessness or agitation. Symptoms of clinical depression include the same symptoms of normal sadness or normal grief and also the following symptoms:  Feeling sad or crying all the time.  Feelings of guilt or worthlessness.  Feelings of hopelessness or helplessness.  Thoughts of suicide or the desire to harm yourself (suicidal ideation).  Loss of touch with reality (psychotic symptoms). Seeing or hearing things that are not real (hallucinations) or having false beliefs about your life or the people around you (delusions and paranoia). DIAGNOSIS  The diagnosis of clinical depression usually is based on the severity and duration of the symptoms. Your caregiver also will ask you questions about your medical history and substance use to find out if physical illness, use of prescription medicine, or substance abuse is causing your depression. Your caregiver also may order blood tests. TREATMENT  Typically, normal sadness and normal grief do not require treatment. However, sometimes antidepressant medicine is prescribed for bereavement to ease the depressive symptoms until they resolve. The treatment for clinical depression depends on the severity of your symptoms but typically includes antidepressant medicine, counseling with a mental health professional, or a combination of both. Your caregiver will help to determine what treatment is best for you. Depression caused by physical illness usually goes away with appropriate medical treatment of the illness. If prescription medicine is causing depression, talk with your caregiver about stopping the medicine, decreasing the dose, or substituting another medicine. Depression caused by abuse of alcohol or illicit drugs abuse goes away with abstinence from these substances. Some adults need professional help in order to  stop drinking or using drugs. SEEK IMMEDIATE CARE IF:  You have thoughts about hurting yourself or others.  You lose touch with reality (have psychotic symptoms).  You are taking medicine for depression and have a serious side effect. FOR MORE INFORMATION National Alliance on Mental Illness: www.nami.[employer]: http://www.maynard.net/ Document Released: 10/26/2000 Document Revised: 04/29/2012 Document Reviewed: 01/28/2012 [facility] Patient Information 2013 [place], [place].

## 2013-03-10 ENCOUNTER — Telehealth: Payer: Self-pay | Admitting: Internal Medicine
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
## 2013-03-11 ENCOUNTER — Ambulatory Visit: Payer: Self-pay | Admitting: Internal Medicine

## 2013-04-15 ENCOUNTER — Ambulatory Visit: Payer: Medicare HMO | Admitting: Internal Medicine

## 2013-04-17 ENCOUNTER — Ambulatory Visit (INDEPENDENT_AMBULATORY_CARE_PROVIDER_SITE_OTHER): Payer: Medicare HMO | Admitting: Internal Medicine

## 2013-04-17 ENCOUNTER — Encounter: Payer: Self-pay | Admitting: Internal Medicine

## 2013-04-17 VITALS — BP 142/80 | HR 80 | Temp 97.9°F | Resp 16 | Wt 179.0 lb

## 2013-04-17 DIAGNOSIS — I2510 Atherosclerotic heart disease of native coronary artery without angina pectoris: Secondary | ICD-10-CM

## 2013-04-17 DIAGNOSIS — R5383 Other fatigue: Secondary | ICD-10-CM

## 2013-04-17 DIAGNOSIS — E119 Type 2 diabetes mellitus without complications: Secondary | ICD-10-CM

## 2013-04-17 DIAGNOSIS — M353 Polymyalgia rheumatica: Secondary | ICD-10-CM

## 2013-04-17 DIAGNOSIS — M545 Low back pain, unspecified: Secondary | ICD-10-CM

## 2013-04-17 DIAGNOSIS — E538 Deficiency of other specified B group vitamins: Secondary | ICD-10-CM

## 2013-04-17 DIAGNOSIS — N259 Disorder resulting from impaired renal tubular function, unspecified: Secondary | ICD-10-CM

## 2013-04-17 DIAGNOSIS — R5381 Other malaise: Secondary | ICD-10-CM

## 2013-04-17 DIAGNOSIS — I10 Essential (primary) hypertension: Secondary | ICD-10-CM

## 2013-04-17 MED ORDER — VITAMIN D3 25 MCG (1000 UNIT) PO TABS: 2000.0000 [IU] | ORAL_TABLET | Freq: Every day | ORAL | Status: DC

## 2013-04-17 MED ORDER — METHYLPREDNISOLONE ACETATE 80 MG/ML IJ SUSP
80.0000 mg | Freq: Once | INTRAMUSCULAR | Status: AC
  Administered 2013-04-17: 80 mg via INTRAMUSCULAR

## 2013-04-17 MED ORDER — METHYLPREDNISOLONE ACETATE 80 MG/ML IJ SUSP: 80.0000 mg | INTRAMUSCULAR | Status: DC

## 2013-04-17 MED ORDER — VITAMIN B-12 1000 MCG SL SUBL: 1.0000 | SUBLINGUAL_TABLET | Freq: Every day | SUBLINGUAL | Status: DC

## 2013-04-17 MED ORDER — CYANOCOBALAMIN 1000 MCG/ML IJ SOLN
1000.0000 ug | Freq: Once | INTRAMUSCULAR | Status: AC
  Administered 2013-04-17: 1000 ug via INTRAMUSCULAR

## 2013-04-17 NOTE — Assessment & Plan Note (Signed)
Continue with current prescription therapy as reflected on the Med list.  

## 2013-04-17 NOTE — Assessment & Plan Note (Signed)
Discussed Labs 
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
## 2013-04-17 NOTE — Patient Instructions (Addendum)
Wt Readings from Last 3 Encounters:  04/17/13 179 lb (81.194 kg)  03/03/13 180 lb (81.647 kg)  01/19/13 186 lb (84.369 kg)   Depo 80 mg IM and Vit B12 1000 mcg IM every 1 month

## 2013-04-17 NOTE — Assessment & Plan Note (Signed)
1000 mcg B12 im SL B12 at home

## 2013-04-17 NOTE — Assessment & Plan Note (Addendum)
Depo 80 mg IM today and q 1 month

## 2013-04-17 NOTE — Assessment & Plan Note (Signed)
labs

## 2013-04-20 ENCOUNTER — Telehealth: Payer: Self-pay | Admitting: *Deleted

## 2013-04-20 NOTE — Telephone Encounter (Signed)
Left msg on triage stating saw md on Friday gave cortisone injection. Wanting to know can she take her arthritis med because she is still having some pain. Called pt back no answer x's 10 rings....Raechel Chute

## 2013-04-20 NOTE — Telephone Encounter (Signed)
Called pt verify med. Pt states she just uses over counter tylenol arthritis inform pt that would be fine to take...[name]

## 2013-04-21 ENCOUNTER — Ambulatory Visit: Payer: Medicare HMO | Admitting: Internal Medicine

## 2013-04-27 ENCOUNTER — Other Ambulatory Visit: Payer: Self-pay | Admitting: *Deleted

## 2013-04-27 MED ORDER — FUROSEMIDE 20 MG PO TABS: 20.0000 mg | ORAL_TABLET | Freq: Every day | ORAL | Status: DC

## 2013-04-27 MED ORDER — METFORMIN HCL 500 MG PO TABS: 500.0000 mg | ORAL_TABLET | Freq: Two times a day (BID) | ORAL | Status: DC

## 2013-04-29 ENCOUNTER — Telehealth: Payer: Self-pay | Admitting: *Deleted

## 2013-04-29 ENCOUNTER — Telehealth: Payer: Self-pay | Admitting: Internal Medicine

## 2013-04-29 MED ORDER — FUROSEMIDE 20 MG PO TABS: 20.0000 mg | ORAL_TABLET | Freq: Every day | ORAL | Status: DC

## 2013-04-29 NOTE — Telephone Encounter (Signed)
The patient called hoping to get a refill on her Furosemide 20mg  sent to the [facility] on [address]

## 2013-04-29 NOTE — Telephone Encounter (Signed)
Done

## 2013-04-29 NOTE — Telephone Encounter (Signed)
Pt wants to know if you think she needs a BP med. She c/o fatigue and no energy and her legs are tingling. She thought furosemide was for HTN and I informed her it was for fluid and swelling. Please advise.

## 2013-04-30 NOTE — Telephone Encounter (Signed)
She can try to take furosemide qod to see if fatigue is better Thx

## 2013-04-30 NOTE — Telephone Encounter (Signed)
Called pt no answer. Will try again later.

## 2013-05-06 NOTE — Telephone Encounter (Signed)
Returned call back to pt x 2// no answer or VM

## 2013-05-07 ENCOUNTER — Emergency Department (HOSPITAL_COMMUNITY): Payer: Medicare HMO

## 2013-05-07 ENCOUNTER — Encounter (HOSPITAL_COMMUNITY): Payer: Self-pay | Admitting: Emergency Medicine

## 2013-05-07 ENCOUNTER — Emergency Department (HOSPITAL_COMMUNITY)
Admission: EM | Admit: 2013-05-07 | Discharge: 2013-05-07 | Disposition: A | Discharge status: Home / Self Care | Payer: Medicare HMO | Attending: Emergency Medicine | Admitting: Emergency Medicine

## 2013-05-07 DIAGNOSIS — Z8669 Personal history of other diseases of the nervous system and sense organs: Secondary | ICD-10-CM | POA: Insufficient documentation

## 2013-05-07 DIAGNOSIS — Z8719 Personal history of other diseases of the digestive system: Secondary | ICD-10-CM | POA: Insufficient documentation

## 2013-05-07 DIAGNOSIS — N390 Urinary tract infection, site not specified: Secondary | ICD-10-CM | POA: Insufficient documentation

## 2013-05-07 DIAGNOSIS — E559 Vitamin D deficiency, unspecified: Secondary | ICD-10-CM | POA: Insufficient documentation

## 2013-05-07 DIAGNOSIS — R079 Chest pain, unspecified: Secondary | ICD-10-CM | POA: Insufficient documentation

## 2013-05-07 DIAGNOSIS — Z79899 Other long term (current) drug therapy: Secondary | ICD-10-CM | POA: Insufficient documentation

## 2013-05-07 DIAGNOSIS — E538 Deficiency of other specified B group vitamins: Secondary | ICD-10-CM | POA: Insufficient documentation

## 2013-05-07 DIAGNOSIS — Z8639 Personal history of other endocrine, nutritional and metabolic disease: Secondary | ICD-10-CM | POA: Insufficient documentation

## 2013-05-07 DIAGNOSIS — D649 Anemia, unspecified: Secondary | ICD-10-CM | POA: Insufficient documentation

## 2013-05-07 DIAGNOSIS — K219 Gastro-esophageal reflux disease without esophagitis: Secondary | ICD-10-CM | POA: Insufficient documentation

## 2013-05-07 DIAGNOSIS — F3289 Other specified depressive episodes: Secondary | ICD-10-CM | POA: Insufficient documentation

## 2013-05-07 DIAGNOSIS — Z8679 Personal history of other diseases of the circulatory system: Secondary | ICD-10-CM | POA: Insufficient documentation

## 2013-05-07 DIAGNOSIS — Z8739 Personal history of other diseases of the musculoskeletal system and connective tissue: Secondary | ICD-10-CM | POA: Insufficient documentation

## 2013-05-07 DIAGNOSIS — I2510 Atherosclerotic heart disease of native coronary artery without angina pectoris: Secondary | ICD-10-CM | POA: Insufficient documentation

## 2013-05-07 DIAGNOSIS — Z86718 Personal history of other venous thrombosis and embolism: Secondary | ICD-10-CM | POA: Insufficient documentation

## 2013-05-07 DIAGNOSIS — F411 Generalized anxiety disorder: Secondary | ICD-10-CM | POA: Insufficient documentation

## 2013-05-07 DIAGNOSIS — F419 Anxiety disorder, unspecified: Secondary | ICD-10-CM

## 2013-05-07 DIAGNOSIS — G8929 Other chronic pain: Secondary | ICD-10-CM | POA: Insufficient documentation

## 2013-05-07 DIAGNOSIS — E785 Hyperlipidemia, unspecified: Secondary | ICD-10-CM | POA: Insufficient documentation

## 2013-05-07 DIAGNOSIS — Z880 Allergy status to penicillin: Secondary | ICD-10-CM | POA: Insufficient documentation

## 2013-05-07 DIAGNOSIS — Z87448 Personal history of other diseases of urinary system: Secondary | ICD-10-CM | POA: Insufficient documentation

## 2013-05-07 DIAGNOSIS — E119 Type 2 diabetes mellitus without complications: Secondary | ICD-10-CM | POA: Insufficient documentation

## 2013-05-07 DIAGNOSIS — Z862 Personal history of diseases of the blood and blood-forming organs and certain disorders involving the immune mechanism: Secondary | ICD-10-CM | POA: Insufficient documentation

## 2013-05-07 DIAGNOSIS — R109 Unspecified abdominal pain: Secondary | ICD-10-CM | POA: Insufficient documentation

## 2013-05-07 DIAGNOSIS — F329 Major depressive disorder, single episode, unspecified: Secondary | ICD-10-CM | POA: Insufficient documentation

## 2013-05-07 LAB — URINE MICROSCOPIC-ADD ON

## 2013-05-07 LAB — URINALYSIS, ROUTINE W REFLEX MICROSCOPIC
Bilirubin Urine: NEGATIVE
Hgb urine dipstick: NEGATIVE
Ketones, ur: NEGATIVE mg/dL
Protein, ur: NEGATIVE mg/dL
Urobilinogen, UA: 0.2 mg/dL (ref 0.0–1.0)

## 2013-05-07 LAB — CBC WITH DIFFERENTIAL/PLATELET
Hemoglobin: 10.3 g/dL — ABNORMAL LOW (ref 12.0–15.0)
Lymphocytes Relative: 18 % (ref 12–46)
Lymphs Abs: 1.3 10*3/uL (ref 0.7–4.0)
MCV: 92 fL (ref 78.0–100.0)
Monocytes Relative: 6 % (ref 3–12)
Neutrophils Relative %: 74 % (ref 43–77)
Platelets: 309 10*3/uL (ref 150–400)
RBC: 3.5 MIL/uL — ABNORMAL LOW (ref 3.87–5.11)
WBC: 7.4 10*3/uL (ref 4.0–10.5)

## 2013-05-07 LAB — COMPREHENSIVE METABOLIC PANEL
ALT: 6 U/L (ref 0–35)
Alkaline Phosphatase: 76 U/L (ref 39–117)
BUN: 35 mg/dL — ABNORMAL HIGH (ref 6–23)
CO2: 22 mEq/L (ref 19–32)
GFR calc Af Amer: 46 mL/min — ABNORMAL LOW (ref >90)
GFR calc non Af Amer: 40 mL/min — ABNORMAL LOW (ref >90)
Glucose, Bld: 99 mg/dL (ref 70–99)
Potassium: 4.5 mEq/L (ref 3.5–5.1)
Sodium: 136 mEq/L (ref 135–145)
Total Bilirubin: 0.3 mg/dL (ref 0.3–1.2)

## 2013-05-07 MED ORDER — LORAZEPAM 2 MG/ML IJ SOLN
0.5000 mg | Freq: Once | INTRAMUSCULAR | Status: AC
  Administered 2013-05-07: 0.5 mg via INTRAVENOUS
  Filled 2013-05-07: qty 1

## 2013-05-07 MED ORDER — LORAZEPAM 1 MG PO TABS: 0.5000 mg | ORAL_TABLET | Freq: Three times a day (TID) | ORAL | Status: DC | PRN

## 2013-05-07 MED ORDER — CIPROFLOXACIN HCL 500 MG PO TABS: 500.0000 mg | ORAL_TABLET | Freq: Two times a day (BID) | ORAL | Status: DC

## 2013-05-07 NOTE — ED Notes (Signed)
States that she has been feeling bad for the last couple of days. Today she is more malaise.

## 2013-05-07 NOTE — ED Provider Notes (Signed)
History    CSN: [id number] Arrival date & time 05/07/13  0930  First MD Initiated Contact with Patient 05/07/13 [phone]     Chief Complaint  Patient presents with  . Hypertension   (Consider location/radiation/quality/duration/timing/severity/associated sxs/prior Treatment) Patient is a 79 y.o. female presenting with anxiety. The history is provided by the patient (the pt complains of anxiety and abdominal pain). No language interpreter was used.  Anxiety This is a new problem. The current episode started yesterday. The problem occurs constantly. The problem has not changed since onset.Associated symptoms include chest pain and abdominal pain. Pertinent negatives include no headaches. Nothing aggravates the symptoms. Nothing relieves the symptoms.   Past Medical History  Diagnosis Date  . CAD (coronary artery disease)     s/p stenting of LAD 1999- cath 5-08 EF normal LAD 30-40% restenosis. D1 50% D2 80% LCX & RCA minimal plaque  . HTN (hypertension)   . Hyperlipemia   . Anemia     iron deficiency  . Depression   . DVT (deep venous thrombosis)   . Gout   . Osteoporosis   . Pancreatitis   . GERD (gastroesophageal reflux disease)   . Renal insufficiency     Cr 1.2-1.3  . Obesity   . Diabetes mellitus   . Polyarthritis     DJD/ possible PMR  . Vitamin D deficiency   . B12 deficiency   . Tinnitus   . Anxiety   . Aneurysm, thoracic aortic   . Constipation   . Urinary frequency   . Vertigo   . Chronic back pain    Past Surgical History  Procedure Laterality Date  . Hemorrhoid surgery    . Abdominal hysterectomy    . Taxus stent    . Cholecystectomy    . Tubal ligation     Family History  Problem Relation Age of Onset  . Adopted: Yes  . Diabetes Mother   . Hypertension Father    History  Substance Use Topics  . Smoking status: Never Smoker   . Smokeless tobacco: Not on file  . Alcohol Use: No   OB History   Grav Para Term Preterm Abortions TAB SAB Ect Mult  Living                 Review of Systems  Constitutional: Negative for appetite change and fatigue.  HENT: Negative for congestion, sinus pressure and ear discharge.   Eyes: Negative for discharge.  Respiratory: Negative for cough.   Cardiovascular: Positive for chest pain.  Gastrointestinal: Positive for abdominal pain. Negative for diarrhea.  Genitourinary: Negative for frequency and hematuria.  Musculoskeletal: Negative for back pain.  Skin: Negative for rash.  Neurological: Negative for seizures and headaches.  Psychiatric/Behavioral: Negative for hallucinations.    Allergies  Amlodipine besylate; Aspirin; Atenolol; Benazepril; Benicar; Cozaar; Hydrochlorothiazide w-triamterene; Hydrocodone; Hydroxyzine pamoate; Iodine; Lisinopril; Penicillins; Prednisolone; and Tramadol hcl  Home Medications   Current Outpatient Rx  Name  Route  Sig  Dispense  Refill  . acetaminophen (TYLENOL) 650 MG CR tablet   Oral   Take 650 mg by mouth every 8 (eight) hours as needed for pain.         . cholecalciferol (VITAMIN D) 1000 UNITS tablet   Oral   Take 2 tablets (2,000 Units total) by mouth daily.   200 tablet   3   . Clotrimazole 100 MG TABS   Vaginal   Place 1 tablet vaginally at bedtime.         [name]  cyanocobalamin (,VITAMIN B-12,) 1000 MCG/ML injection      1 ml sq q 2 wks         . Cyanocobalamin (VITAMIN B-12) 1000 MCG SUBL   Sublingual   Place 1 tablet (1,000 mcg total) under the tongue daily.   100 tablet   3   . FLUoxetine (PROZAC) 10 MG capsule   Oral   Take 1 capsule (10 mg total) by mouth daily.   30 capsule   3   . furosemide (LASIX) 20 MG tablet   Oral   Take 1 tablet (20 mg total) by mouth daily.   90 tablet   2   . glucose blood test strip   Other   1 each by Other route daily.         . Lancets MISC   Does not apply   1 each by Does not apply route daily. Please dispense lancets -OneTouch Ultra. Dx:250.00   100 each   1   . metFORMIN  (GLUCOPHAGE) 500 MG tablet   Oral   Take 1 tablet (500 mg total) by mouth 2 (two) times daily with a meal.   180 tablet   2   . omeprazole (PRILOSEC) 40 MG capsule   Oral   Take 40 mg by mouth daily.         [name] oxybutynin (DITROPAN) 5 MG tablet   Oral   Take 5 mg by mouth 2 (two) times daily.         . pravastatin (PRAVACHOL) 20 MG tablet   Oral   Take 20 mg by mouth at bedtime.         . SYRINGE/NEEDLE, DISP, 1 ML 25G X 5/8" 1 ML MISC   Does not apply   1 each by Does not apply route every 30 (thirty) days.         [name] triamcinolone ointment (KENALOG) 0.1 %   Topical   Apply 1 application topically 2 (two) times daily.         . ciprofloxacin (CIPRO) 500 MG tablet   Oral   Take 1 tablet (500 mg total) by mouth 2 (two) times daily. One po bid x 7 days   14 tablet   0   . LORazepam (ATIVAN) 1 MG tablet   Oral   Take 0.5 tablets (0.5 mg total) by mouth every 8 (eight) hours as needed for anxiety.   20 tablet   0    BP 211/83  Pulse 77  Temp(Src) 98.6 F (37 C) (Oral)  Resp 24  SpO2 100% Physical Exam  Constitutional: She is oriented to person, place, and time. She appears well-developed.  HENT:  Head: Normocephalic.  Eyes: Conjunctivae and EOM are normal. No scleral icterus.  Neck: Neck supple. No thyromegaly present.  Cardiovascular: Normal rate and regular rhythm.  Exam reveals no gallop and no friction rub.   No murmur heard. Pulmonary/Chest: No stridor. She has no wheezes. She has no rales. She exhibits no tenderness.  Abdominal: She exhibits no distension. There is tenderness. There is no rebound.  Minor suprapubic abd tender  Musculoskeletal: Normal range of motion. She exhibits no edema.  Lymphadenopathy:    She has no cervical adenopathy.  Neurological: She is oriented to person, place, and time. Coordination normal.  Skin: No rash noted. No erythema.  Psychiatric:  Pt anxious    ED Course  Procedures (including critical care time) Labs  Reviewed  CBC WITH DIFFERENTIAL - Abnormal; Notable for  the following:    RBC 3.50 (*)    Hemoglobin 10.3 (*)    HCT 32.2 (*)    All other components within normal limits  COMPREHENSIVE METABOLIC PANEL - Abnormal; Notable for the following:    BUN 35 (*)    Creatinine, Ser 1.25 (*)    Total Protein 9.0 (*)    GFR calc non Af Amer 40 (*)    GFR calc Af Amer 46 (*)    All other components within normal limits  URINALYSIS, ROUTINE W REFLEX MICROSCOPIC - Abnormal; Notable for the following:    APPearance CLOUDY (*)    Leukocytes, UA SMALL (*)    All other components within normal limits  URINE MICROSCOPIC-ADD ON - Abnormal; Notable for the following:    Bacteria, UA MANY (*)    All other components within normal limits  URINE CULTURE  TROPONIN I   Ct Head Wo Contrast  05/07/2013   *RADIOLOGY REPORT*  Clinical Data: Dizziness.  Hypertension.  Unsteady gait.  CT HEAD WITHOUT CONTRAST  Technique:  Contiguous axial images were obtained from the base of the skull through the vertex without contrast.  Comparison: 09/18/2012.  Findings: No intracranial hemorrhage.  No CT evidence of large acute infarct.  Mild atrophy without hydrocephalus.  No intracranial mass lesion detected on this unenhanced exam.  Sella may be partially empty although incompletely assessed.  Probable arachnoid granulation right frontal calvarium and unchanged.  Mastoid air cells, middle ear cavities and visualized paranasal sinuses are clear.  IMPRESSION: No acute abnormality.  Please see above.   Original Report Authenticated By: Lacy Duverney, M.D.   Dg Chest Port 1 View  05/07/2013   *RADIOLOGY REPORT*  Clinical Data: Weakness, hypertension, chest pain  PORTABLE CHEST - 1 VIEW  Comparison: Chest x-ray of 09/18/2012  Findings: No active infiltrate or effusion is seen.  The heart is mildly enlarged and stable.  No bony abnormality is seen.  IMPRESSION: No active lung disease.  No change in borderline cardiomegaly.   Original  Report Authenticated By: Dwyane Dee, M.D.   1. UTI (lower urinary tract infection)   2. Anxiety     Date: 05/07/2013  Rate: 82  Rhythm: normal sinus rhythm  QRS Axis: normal  Intervals: normal  ST/T Wave abnormalities: nonspecific ST changes  Conduction Disutrbances:none  Narrative Interpretation:   Old EKG Reviewed: unchanged   MDM    Benny Lennert, MD 05/07/13 1324

## 2013-05-08 LAB — URINE CULTURE

## 2013-05-18 ENCOUNTER — Ambulatory Visit: Payer: Medicare HMO

## 2013-06-17 ENCOUNTER — Ambulatory Visit: Payer: Medicare HMO

## 2013-07-20 ENCOUNTER — Ambulatory Visit: Payer: Medicare HMO

## 2013-07-20 ENCOUNTER — Ambulatory Visit: Payer: Medicare HMO | Admitting: Internal Medicine

## 2013-07-27 ENCOUNTER — Ambulatory Visit (INDEPENDENT_AMBULATORY_CARE_PROVIDER_SITE_OTHER): Payer: Medicare HMO | Admitting: Internal Medicine

## 2013-07-27 ENCOUNTER — Encounter: Payer: Self-pay | Admitting: Internal Medicine

## 2013-07-27 VITALS — BP 190/110 | HR 80 | Temp 98.6°F | Resp 12 | Wt 175.0 lb

## 2013-07-27 DIAGNOSIS — M353 Polymyalgia rheumatica: Secondary | ICD-10-CM

## 2013-07-27 DIAGNOSIS — Z23 Encounter for immunization: Secondary | ICD-10-CM

## 2013-07-27 DIAGNOSIS — I2510 Atherosclerotic heart disease of native coronary artery without angina pectoris: Secondary | ICD-10-CM

## 2013-07-27 DIAGNOSIS — E119 Type 2 diabetes mellitus without complications: Secondary | ICD-10-CM

## 2013-07-27 DIAGNOSIS — E538 Deficiency of other specified B group vitamins: Secondary | ICD-10-CM

## 2013-07-27 DIAGNOSIS — F3289 Other specified depressive episodes: Secondary | ICD-10-CM

## 2013-07-27 DIAGNOSIS — I10 Essential (primary) hypertension: Secondary | ICD-10-CM

## 2013-07-27 DIAGNOSIS — M25561 Pain in right knee: Secondary | ICD-10-CM

## 2013-07-27 DIAGNOSIS — M25569 Pain in unspecified knee: Secondary | ICD-10-CM

## 2013-07-27 DIAGNOSIS — F329 Major depressive disorder, single episode, unspecified: Secondary | ICD-10-CM

## 2013-07-27 MED ORDER — CARVEDILOL 6.25 MG PO TABS: 6.2500 mg | ORAL_TABLET | Freq: Two times a day (BID) | ORAL | Status: DC

## 2013-07-27 MED ORDER — FENTANYL 12 MCG/HR TD PT72: 1.0000 | MEDICATED_PATCH | TRANSDERMAL | Status: DC

## 2013-07-27 MED ORDER — METHYLPREDNISOLONE ACETATE 80 MG/ML IJ SUSP: 80.0000 mg | Freq: Once | INTRAMUSCULAR | Status: DC

## 2013-07-27 NOTE — Assessment & Plan Note (Signed)
Continue with current prescription therapy as reflected on the Med list.  
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
## 2013-07-27 NOTE — Patient Instructions (Signed)
Postprocedure instructions :    A Band-Aid should be left on for 12 hours. Injection therapy is not a cure itself. It is used in conjunction with other modalities. You can use nonsteroidal anti-inflammatories like ibuprofen , hot and cold compresses. Rest is recommended in the next 24 hours. You need to report immediately  if fever, chills or any signs of infection develop. 

## 2013-07-27 NOTE — Assessment & Plan Note (Signed)
She stopped Prozac

## 2013-07-27 NOTE — Assessment & Plan Note (Signed)
Will inject per pt's request

## 2013-07-27 NOTE — Assessment & Plan Note (Signed)
Not taking prednisone/shots

## 2013-07-31 ENCOUNTER — Other Ambulatory Visit: Payer: Self-pay

## 2013-07-31 MED ORDER — PRAVASTATIN SODIUM 20 MG PO TABS: 20.0000 mg | ORAL_TABLET | Freq: Every day | ORAL | Status: DC

## 2013-08-27 ENCOUNTER — Encounter: Payer: Self-pay | Admitting: Internal Medicine

## 2013-08-27 ENCOUNTER — Other Ambulatory Visit (INDEPENDENT_AMBULATORY_CARE_PROVIDER_SITE_OTHER): Payer: Commercial Managed Care - HMO

## 2013-08-27 ENCOUNTER — Ambulatory Visit (INDEPENDENT_AMBULATORY_CARE_PROVIDER_SITE_OTHER): Payer: Medicare HMO | Admitting: Internal Medicine

## 2013-08-27 VITALS — BP 218/112 | HR 80 | Temp 98.4°F | Resp 16 | Wt 174.0 lb

## 2013-08-27 DIAGNOSIS — R5381 Other malaise: Secondary | ICD-10-CM

## 2013-08-27 DIAGNOSIS — M353 Polymyalgia rheumatica: Secondary | ICD-10-CM

## 2013-08-27 DIAGNOSIS — E538 Deficiency of other specified B group vitamins: Secondary | ICD-10-CM

## 2013-08-27 DIAGNOSIS — E119 Type 2 diabetes mellitus without complications: Secondary | ICD-10-CM

## 2013-08-27 DIAGNOSIS — M2550 Pain in unspecified joint: Secondary | ICD-10-CM

## 2013-08-27 DIAGNOSIS — I10 Essential (primary) hypertension: Secondary | ICD-10-CM

## 2013-08-27 DIAGNOSIS — N259 Disorder resulting from impaired renal tubular function, unspecified: Secondary | ICD-10-CM

## 2013-08-27 DIAGNOSIS — R5383 Other fatigue: Secondary | ICD-10-CM

## 2013-08-27 DIAGNOSIS — D638 Anemia in other chronic diseases classified elsewhere: Secondary | ICD-10-CM

## 2013-08-27 LAB — CBC WITH DIFFERENTIAL/PLATELET
Basophils Absolute: 0 10*3/uL (ref 0.0–0.1)
Eosinophils Absolute: 0.1 10*3/uL (ref 0.0–0.7)
Hemoglobin: 10 g/dL — ABNORMAL LOW (ref 12.0–15.0)
Lymphocytes Relative: 15.8 % (ref 12.0–46.0)
Lymphs Abs: 0.9 10*3/uL (ref 0.7–4.0)
MCHC: 33.3 g/dL (ref 30.0–36.0)
Neutro Abs: 4.4 10*3/uL (ref 1.4–7.7)
RDW: 13.3 % (ref 11.5–14.6)

## 2013-08-27 LAB — BASIC METABOLIC PANEL
CO2: 27 mEq/L (ref 19–32)
Calcium: 9.8 mg/dL (ref 8.4–10.5)
Glucose, Bld: 135 mg/dL — ABNORMAL HIGH (ref 70–99)
Sodium: 139 mEq/L (ref 135–145)

## 2013-08-27 LAB — HEPATIC FUNCTION PANEL
Albumin: 4.2 g/dL (ref 3.5–5.2)
Alkaline Phosphatase: 59 U/L (ref 39–117)

## 2013-08-27 MED ORDER — METHYLPREDNISOLONE ACETATE 80 MG/ML IJ SUSP
80.0000 mg | Freq: Once | INTRAMUSCULAR | Status: AC
  Administered 2013-08-27: 80 mg via INTRAMUSCULAR

## 2013-08-27 MED ORDER — FENTANYL 12 MCG/HR TD PT72: 1.0000 | MEDICATED_PATCH | TRANSDERMAL | Status: DC

## 2013-08-27 NOTE — Assessment & Plan Note (Signed)
Continue with current prescription therapy as reflected on the Med list.  

## 2013-08-27 NOTE — Assessment & Plan Note (Signed)
Continue with current prescription therapy as reflected on the Med list. Will try Duragesic (pt never got previous Rx filled)

## 2013-08-27 NOTE — Assessment & Plan Note (Signed)
Watching labs 

## 2013-08-27 NOTE — Assessment & Plan Note (Signed)
  Chronic, multifactorial. CFS

## 2013-08-27 NOTE — Assessment & Plan Note (Addendum)
Chronic  Med cost is an issue.  I'll call [facility]  Risks associated with treatment noncompliance were discussed. Compliance was encouraged.
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
## 2013-08-27 NOTE — Assessment & Plan Note (Signed)
She agreed to try monthly Depomedrol 80 mg IM  [facility] ref

## 2013-08-31 ENCOUNTER — Telehealth: Payer: Self-pay | Admitting: Internal Medicine

## 2013-08-31 NOTE — Telephone Encounter (Signed)
Done

## 2013-08-31 NOTE — Telephone Encounter (Signed)
Pt wants a copy of her labs from last week mailed to her.

## 2013-09-22 ENCOUNTER — Other Ambulatory Visit: Payer: Self-pay | Admitting: *Deleted

## 2013-09-22 MED ORDER — PRAVASTATIN SODIUM 20 MG PO TABS: 20.0000 mg | ORAL_TABLET | Freq: Every evening | ORAL | Status: DC

## 2013-09-22 MED ORDER — PRAVASTATIN SODIUM 20 MG PO TABS: 20.0000 mg | ORAL_TABLET | Freq: Every day | ORAL | Status: DC

## 2013-09-22 NOTE — Telephone Encounter (Signed)
Patient called in for refill but hasnt been here since last June, will make appt with Dr Antoine Poche

## 2013-09-23 ENCOUNTER — Encounter: Payer: Self-pay | Admitting: Internal Medicine

## 2013-09-23 ENCOUNTER — Ambulatory Visit (INDEPENDENT_AMBULATORY_CARE_PROVIDER_SITE_OTHER): Payer: Medicare HMO | Admitting: Internal Medicine

## 2013-09-23 VITALS — BP 192/100 | HR 72 | Temp 97.7°F | Resp 16 | Wt 172.0 lb

## 2013-09-23 DIAGNOSIS — N259 Disorder resulting from impaired renal tubular function, unspecified: Secondary | ICD-10-CM

## 2013-09-23 DIAGNOSIS — M545 Low back pain, unspecified: Secondary | ICD-10-CM

## 2013-09-23 DIAGNOSIS — E119 Type 2 diabetes mellitus without complications: Secondary | ICD-10-CM

## 2013-09-23 DIAGNOSIS — I129 Hypertensive chronic kidney disease with stage 1 through stage 4 chronic kidney disease, or unspecified chronic kidney disease: Secondary | ICD-10-CM

## 2013-09-23 DIAGNOSIS — M353 Polymyalgia rheumatica: Secondary | ICD-10-CM

## 2013-09-23 DIAGNOSIS — E538 Deficiency of other specified B group vitamins: Secondary | ICD-10-CM

## 2013-09-23 DIAGNOSIS — I509 Heart failure, unspecified: Secondary | ICD-10-CM

## 2013-09-23 DIAGNOSIS — I130 Hypertensive heart and chronic kidney disease with heart failure and stage 1 through stage 4 chronic kidney disease, or unspecified chronic kidney disease: Secondary | ICD-10-CM | POA: Insufficient documentation

## 2013-09-23 MED ORDER — METHYLPREDNISOLONE ACETATE 80 MG/ML IJ SUSP
80.0000 mg | Freq: Once | INTRAMUSCULAR | Status: AC
  Administered 2013-09-23: 80 mg via INTRAMUSCULAR

## 2013-09-23 MED ORDER — CARVEDILOL 25 MG PO TABS: 25.0000 mg | ORAL_TABLET | Freq: Two times a day (BID) | ORAL | Status: DC

## 2013-09-23 NOTE — Assessment & Plan Note (Signed)
Continue with current prescription therapy as reflected on the Med list.  

## 2013-09-23 NOTE — Assessment & Plan Note (Signed)
Coreg dose was increased Cont Lasix NAS diet

## 2013-09-23 NOTE — Progress Notes (Signed)
Pre visit review using our clinic review tool, if applicable. No additional management support is needed unless otherwise documented below in the visit note. 

## 2013-09-23 NOTE — Assessment & Plan Note (Signed)
Better on monthly Depomedrol Continue with current IM steroid therapy as reflected on the Med list.
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
## 2013-10-22 ENCOUNTER — Ambulatory Visit: Payer: Medicare HMO | Admitting: Internal Medicine

## 2013-10-22 DIAGNOSIS — Z0289 Encounter for other administrative examinations: Secondary | ICD-10-CM

## 2013-11-02 ENCOUNTER — Ambulatory Visit (INDEPENDENT_AMBULATORY_CARE_PROVIDER_SITE_OTHER): Payer: Medicare HMO | Admitting: Internal Medicine

## 2013-11-02 ENCOUNTER — Encounter: Payer: Self-pay | Admitting: Internal Medicine

## 2013-11-02 VITALS — BP 148/72 | HR 76 | Temp 98.4°F | Resp 16 | Wt 171.0 lb

## 2013-11-02 DIAGNOSIS — D638 Anemia in other chronic diseases classified elsewhere: Secondary | ICD-10-CM

## 2013-11-02 DIAGNOSIS — N259 Disorder resulting from impaired renal tubular function, unspecified: Secondary | ICD-10-CM

## 2013-11-02 DIAGNOSIS — E538 Deficiency of other specified B group vitamins: Secondary | ICD-10-CM

## 2013-11-02 DIAGNOSIS — R5382 Chronic fatigue, unspecified: Secondary | ICD-10-CM

## 2013-11-02 DIAGNOSIS — E119 Type 2 diabetes mellitus without complications: Secondary | ICD-10-CM

## 2013-11-02 DIAGNOSIS — M353 Polymyalgia rheumatica: Secondary | ICD-10-CM

## 2013-11-02 DIAGNOSIS — M2550 Pain in unspecified joint: Secondary | ICD-10-CM

## 2013-11-02 MED ORDER — METHYLPREDNISOLONE ACETATE 80 MG/ML IJ SUSP
80.0000 mg | Freq: Once | INTRAMUSCULAR | Status: AC
  Administered 2013-11-02: 80 mg via INTRAMUSCULAR

## 2013-11-02 MED ORDER — PREDNISONE 10 MG PO TABS: ORAL_TABLET | ORAL | Status: DC

## 2013-11-02 NOTE — Assessment & Plan Note (Signed)
Continue with current prescription therapy as reflected on the Med list.  

## 2013-11-02 NOTE — Assessment & Plan Note (Signed)
12/14: She agreed to try daily Prednisone

## 2013-11-02 NOTE — Assessment & Plan Note (Signed)
Labs
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
## 2013-11-02 NOTE — Patient Instructions (Signed)
Polymyalgia Rheumatica Polymyalgia rheumatica (also called PMR or polymyalgia) is a rheumatologic (arthritic) condition that causes pain and morning stiffness in your neck, shoulders, and hips. It is an inflammatory condition. In some people, inflammation of certain structures in the shoulder, hips, or other joints can be seen on special testing. It does not cause joint destruction, as occurs in other arthritic conditions. It usually occurs after 77 years of age, and is more common as you age. It can be confused with several other diseases, but it is usually easily treated. People with PMR often have, or can develop, a more severe rheumatologic condition called giant cell arteritis (also called CGA or temporal arteritis).  CAUSES  The exact cause of PMR is not known.   There are genetic factors involved.  Viruses have been suspected in the cause of PMR. This has not been proven. SYMPTOMS   Aching, pain, and morning stiffness your neck, both shoulders, or both hips.  Symptoms usually start slowly and build gradually.  Morning stiffness usually lasts at least 30 minutes.  Swelling and tenderness in other joints of the arms, hands, legs, and feet may occur.  Swelling and inflammation in the wrists can cause nerve inflammation at the wrist (carpal tunnel syndrome).  You may also have low grade fever, fatigue, weakness, decreased appetite and weight loss. DIAGNOSIS   Your caregiver may suspect that you have PMR based on your description of your symptoms and on your exam.  Your caregiver will examine you to be sure you do not have diseases that can be confused with PMR. These diseases include rheumatoid arthritis, fibromyalgia, or thyroid disease.  Your caregiver should check for signs of giant cell arteritis. This can cause serious complications such as blindness.  Lab tests can help confirm that you have PMR and not other diseases, but are sometimes inconclusive.  X-rays cannot show PMR.  However, it can identify other diseases like rheumatoid arthritis. Your caregiver may have you see a specialist in arthritis and inflammatory diseases (rheumatologist). TREATMENT  The goal of treatment is relief of symptoms. Treatment does not shorten the course of the illness or prevent complications. With proper treatment, you usually feel better almost right away.   The initial treatment of PMR is usually a cortisone (steroid) medication. Your caregiver will help determine a starting dose. The dose is gradually reduced every few weeks to months. Treatment usually lasts one to three years.  Other stronger medications are rarely needed. They will only be prescribed if your symptoms do not get better on cortisone medication alone, or if they recur as the dose is reduced.  Cortisone medication can have different side effects. With the doses of cortisone needed for PMR, the side effects can affect bones and joints, blood sugar control in diabetes, and mood changes. Discuss this with your caregiver.  Your caregiver will evaluate you regularly during your treatment. They will do this in order to assess progress and to check for complications of the illness or treatment.  Physical therapy is sometimes useful. This is especially true if your joints are still stiff after other symptoms have improved. HOME CARE INSTRUCTIONS   Follow your caregiver's instructions. Do not change your dose of cortisone medication on your own.  Keep your appointments for follow-up lab tests and caregiver visits. Your lab tests need to be monitored. You must get checked periodically for giant cell arteritis.  Follow your caregiver's guidance regarding physical activity (usually no restrictions are needed) or physical therapy.  Your caregiver   may have instructions to prevent or check for side effects from cortisone medication (including bone density testing or treatment). Follow their instructions carefully. SEEK MEDICAL  CARE IF:   You develop any side effects from treatment. Side effects can include:  Elevated blood pressure.  High blood sugar (or worsening of diabetes, if you are diabetic).  Difficulty fighting off infections.  Weight gain.  Weakness of the bones (osteoporosis).  Your aches, pains, morning stiffness, or other symptoms get worse with time. This is especially true after your dose of cortisone is reduced.  You develop new joint symptoms (pain, swelling, etc.) SEEK IMMEDIATE MEDICAL CARE IF:   You develop a severe headache.  You start vomiting.  You have problems with your vision.  You have an oral temperature above 102 F (38.9 C), not controlled by medicine. Document Released: 12/06/2004 Document Revised: 10/15/2012 Document Reviewed: 03/21/2009 [employer] Patient Information 2014 [employer].  

## 2013-11-02 NOTE — Assessment & Plan Note (Signed)
Better w/steroids

## 2013-11-02 NOTE — Progress Notes (Signed)
Pre visit review using our clinic review tool, if applicable. No additional management support is needed unless otherwise documented below in the visit note. 

## 2013-11-16 ENCOUNTER — Telehealth: Payer: Self-pay | Admitting: Internal Medicine

## 2013-11-16 MED ORDER — TOLTERODINE TARTRATE ER 4 MG PO CP24: 4.0000 mg | ORAL_CAPSULE | Freq: Every day | ORAL | Status: DC

## 2013-11-16 NOTE — Telephone Encounter (Signed)
Pt called stated the bladder medication that Dr. Macario Golds gave to her 12.22.14 is not working. Pt request another med to be send into [facility]. Please advise.

## 2013-11-16 NOTE — Telephone Encounter (Signed)
OK Detrol LA instead - emailed Thx

## 2013-11-16 NOTE — Telephone Encounter (Signed)
[name] husband  informed of below.  

## 2013-11-19 ENCOUNTER — Ambulatory Visit: Payer: Medicare HMO | Admitting: Cardiology

## 2013-11-20 ENCOUNTER — Telehealth: Payer: Self-pay

## 2013-11-20 MED ORDER — OXYBUTYNIN CHLORIDE 5 MG PO TABS: 5.0000 mg | ORAL_TABLET | Freq: Two times a day (BID) | ORAL | Status: DC

## 2013-11-20 NOTE — Telephone Encounter (Signed)
The patient called and is hoping to get some of her medication changed due to the copays for the meds being too expensive.  Thanks!

## 2013-11-20 NOTE — Telephone Encounter (Signed)
Spoke to pt, she states that the Detrol LA was too expensive & would like Ditropan 5mg  sent into [facility] instead. Please advise on correct sig.

## 2013-11-20 NOTE — Telephone Encounter (Signed)
Ok - note: it can cause dry mouth, fatigue Thx

## 2013-11-23 NOTE — Telephone Encounter (Signed)
Pt made aware rx sent to pharmacy

## 2013-11-30 ENCOUNTER — Ambulatory Visit: Payer: Medicare HMO | Admitting: Internal Medicine

## 2013-12-01 ENCOUNTER — Ambulatory Visit (INDEPENDENT_AMBULATORY_CARE_PROVIDER_SITE_OTHER): Payer: Medicare HMO | Admitting: Internal Medicine

## 2013-12-01 ENCOUNTER — Encounter: Payer: Self-pay | Admitting: Internal Medicine

## 2013-12-01 VITALS — BP 160/80 | HR 72 | Temp 98.6°F | Resp 16 | Wt 172.0 lb

## 2013-12-01 DIAGNOSIS — I2510 Atherosclerotic heart disease of native coronary artery without angina pectoris: Secondary | ICD-10-CM

## 2013-12-01 DIAGNOSIS — M545 Low back pain, unspecified: Secondary | ICD-10-CM

## 2013-12-01 DIAGNOSIS — E119 Type 2 diabetes mellitus without complications: Secondary | ICD-10-CM

## 2013-12-01 DIAGNOSIS — G9332 Myalgic encephalomyelitis/chronic fatigue syndrome: Secondary | ICD-10-CM

## 2013-12-01 DIAGNOSIS — M2550 Pain in unspecified joint: Secondary | ICD-10-CM

## 2013-12-01 DIAGNOSIS — M353 Polymyalgia rheumatica: Secondary | ICD-10-CM

## 2013-12-01 DIAGNOSIS — R5382 Chronic fatigue, unspecified: Secondary | ICD-10-CM

## 2013-12-01 LAB — GLUCOSE, POCT (MANUAL RESULT ENTRY): POC GLUCOSE: 99 mg/dL (ref 70–99)

## 2013-12-01 NOTE — Assessment & Plan Note (Signed)
Continue with current prescription therapy as reflected on the Med list.  

## 2013-12-01 NOTE — Assessment & Plan Note (Addendum)
12/14 Prednisone - better Continue with current steroid prescription therapy as reflected on the Med list. Stairs lift chair Rx
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
## 2013-12-01 NOTE — Progress Notes (Signed)
Pre visit review using our clinic review tool, if applicable. No additional management support is needed unless otherwise documented below in the visit note. 

## 2013-12-01 NOTE — Assessment & Plan Note (Signed)
Better  

## 2013-12-03 ENCOUNTER — Telehealth: Payer: Self-pay | Admitting: Internal Medicine

## 2013-12-03 MED ORDER — VITAMIN B-12 1000 MCG SL SUBL: 1.0000 | SUBLINGUAL_TABLET | Freq: Every day | SUBLINGUAL | Status: DC

## 2013-12-03 NOTE — Telephone Encounter (Signed)
B12 Rx printed. Handicap placard form given to MD for completion.

## 2013-12-03 NOTE — Telephone Encounter (Signed)
Needs a new rx for the Vitamin B-12 pill that are over the counter.  She also needs a new handicap placard filled out.

## 2013-12-04 ENCOUNTER — Telehealth: Payer: Self-pay

## 2013-12-04 ENCOUNTER — Telehealth: Payer: Self-pay | Admitting: *Deleted

## 2013-12-04 NOTE — Telephone Encounter (Signed)
Ok Thx 

## 2013-12-04 NOTE — Telephone Encounter (Signed)
Spoke with pt advised Rx and placard ready for pick up

## 2013-12-04 NOTE — Telephone Encounter (Signed)
Pt called requesting status of Handi cap placard.  Please advise

## 2013-12-04 NOTE — Telephone Encounter (Signed)
Relevant patient education mailed to patient.  

## 2013-12-04 NOTE — Telephone Encounter (Signed)
Handled previously by triage, closing note

## 2013-12-28 ENCOUNTER — Ambulatory Visit: Payer: Medicare HMO | Admitting: Cardiology

## 2014-01-12 ENCOUNTER — Encounter: Payer: Self-pay | Admitting: Cardiology

## 2014-01-12 ENCOUNTER — Ambulatory Visit (INDEPENDENT_AMBULATORY_CARE_PROVIDER_SITE_OTHER): Payer: Commercial Managed Care - HMO | Admitting: Cardiology

## 2014-01-12 VITALS — BP 148/72 | HR 59 | Ht 65.0 in | Wt 172.0 lb

## 2014-01-12 DIAGNOSIS — I2510 Atherosclerotic heart disease of native coronary artery without angina pectoris: Secondary | ICD-10-CM

## 2014-01-12 DIAGNOSIS — I129 Hypertensive chronic kidney disease with stage 1 through stage 4 chronic kidney disease, or unspecified chronic kidney disease: Secondary | ICD-10-CM

## 2014-01-12 DIAGNOSIS — I2589 Other forms of chronic ischemic heart disease: Secondary | ICD-10-CM

## 2014-01-12 DIAGNOSIS — Z79899 Other long term (current) drug therapy: Secondary | ICD-10-CM

## 2014-01-12 DIAGNOSIS — E785 Hyperlipidemia, unspecified: Secondary | ICD-10-CM

## 2014-01-12 DIAGNOSIS — I509 Heart failure, unspecified: Secondary | ICD-10-CM

## 2014-01-12 DIAGNOSIS — I130 Hypertensive heart and chronic kidney disease with heart failure and stage 1 through stage 4 chronic kidney disease, or unspecified chronic kidney disease: Secondary | ICD-10-CM

## 2014-01-12 LAB — LIPID PANEL
CHOL/HDL RATIO: 3
CHOLESTEROL: 203 mg/dL — AB (ref 0–200)
HDL: 67 mg/dL (ref >39.00)
LDL Cholesterol: 122 mg/dL — ABNORMAL HIGH (ref 0–99)
TRIGLYCERIDES: 71 mg/dL (ref 0.0–149.0)
VLDL: 14.2 mg/dL (ref 0.0–40.0)

## 2014-01-12 LAB — HEPATIC FUNCTION PANEL
ALK PHOS: 55 U/L (ref 39–117)
ALT: 5 U/L (ref 0–35)
AST: 14 U/L (ref 0–37)
Albumin: 3.6 g/dL (ref 3.5–5.2)
Bilirubin, Direct: 0.1 mg/dL (ref 0.0–0.3)
Total Bilirubin: 0.6 mg/dL (ref 0.3–1.2)
Total Protein: 7.3 g/dL (ref 6.0–8.3)

## 2014-01-12 NOTE — Patient Instructions (Signed)
The current medical regimen is effective;  continue present plan and medications.  Please have blood work today (Lipid and Liver profile)  Follow up in 1 year with Dr Antoine Poche.  You will receive a letter in the mail 2 months before you are due.  Please call us when you receive this letter to schedule your follow up appointment.

## 2014-01-12 NOTE — Progress Notes (Signed)
HPI Patient presents for followup of known coronary disease. She saw Dr. Gala Romney in the past and this is my second appt with me.  She is very limited by joint pains. Her biggest complaint is really fatigue and she reports being very depressed.  She describes anhedonia.  She sleeps on 2 pillows which has been chronic. She's not describing any PND. She has chronic tiredness.  She does have dyspnea with activity but this is not changed from previous.  Today she is not describing new chest, neck or arm pain.  Allergies  Allergen Reactions  . Amlodipine Besylate     REACTION: dizzy  . Aspirin   . Atenolol     REACTION: fatigue  . Benazepril     cough  . Benicar [Olmesartan Medoxomil]     HA  . Cozaar     nausea  . Hydrochlorothiazide W-Triamterene     REACTION: dizzy  . Hydrocodone     REACTION: HA  . Hydroxyzine Pamoate   . Iodine   . Lisinopril     REACTION: tired, cough  . Penicillins   . Pravastatin     myalgias  . Prednisolone     My stomach hurts  . Tramadol Hcl     REACTION: HA    Current Outpatient Prescriptions  Medication Sig Dispense Refill  . aspirin EC 325 MG tablet Take 325 mg by mouth daily.        . Cholecalciferol (VITAMIN D3) 1000 UNITS tablet Take 1 tablet (1,000 Units total) by mouth daily.  100 tablet  3  . [facility] NON-ASPIRIN EXTRA STRENGTH 500 MG tablet TAKE 1 TABLET BY MOUTH 3 TIMES A DAY AS NEEDED FOR PAIN  100 tablet  3  . [facility] VITAMIN B-12 500 MCG tablet TAKE 1 TABLET BY MOUTH EVERY DAY  30 tablet  5  . glucose blood (ONE TOUCH TEST STRIPS) test strip 1 each by Other route daily.  100 each  1  . Lancets MISC 1 each by Does not apply route daily. Please dispense lancets -OneTouch Ultra. Dx:250.00  100 each  1  . metFORMIN (GLUCOPHAGE) 500 MG tablet Take 1 tablet (500 mg total) by mouth 2 (two) times daily with a meal.  180 tablet  1  . oxybutynin (DITROPAN) 5 MG tablet Take 1 tablet (5 mg total) by mouth 3 (three) times daily. prn  270 tablet  1  .  DISCONTD: olmesartan (BENICAR) 20 MG tablet Take 20 mg by mouth daily.         Past Medical History  Diagnosis Date  . CAD (coronary artery disease)     s/p stenting of LAD 1999- cath 5-08 EF normal LAD 30-40% restenosis. D1 50% D2 80% LCX & RCA minimal plaque  . HTN (hypertension)   . Hyperlipemia   . Anemia     iron deficiency  . Depression   . DVT (deep venous thrombosis)   . Gout   . Osteoporosis   . Pancreatitis   . GERD (gastroesophageal reflux disease)   . Renal insufficiency     Cr 1.2-1.3  . Obesity   . Diabetes mellitus   . Polyarthritis     DJD/ possible PMR  . Vitamin D deficiency   . B12 deficiency   . Tinnitus   . Anxiety   . Aneurysm, thoracic aortic   . Constipation   . Urinary frequency   . Vertigo   . Chronic back pain     Past Surgical History  Procedure Laterality Date  . Hemorrhoid surgery    . Abdominal hysterectomy    . Cholecystectomy    . Tubal ligation      ROS:  As stated in the HPI and negative for all other systems.   PHYSICAL EXAM BP 148/72  Pulse 59  Ht 5\' 5"  (1.651 m)  Wt 172 lb (78.019 kg)  BMI 28.62 kg/m2 GENERAL:  Well appearing HEENT:  Pupils equal round and reactive, fundi not visualized, oral mucosa unremarkable, dentures NECK:  No jugular venous distention, waveform within normal limits, carotid upstroke brisk and symmetric, no bruits, no thyromegaly LYMPHATICS:  No cervical, inguinal adenopathy LUNGS:  Clear to auscultation bilaterally BACK:  No CVA tenderness CHEST:  Unremarkable HEART:  PMI not displaced or sustained,S1 and S2 within normal limits, no S3, no S4, no clicks, no rubs, no murmurs ABD:  Flat, positive bowel sounds normal in frequency in pitch, no bruits, no rebound, no guarding, no midline pulsatile mass, no hepatomegaly, no splenomegaly, obese EXT:  2 plus pulses throughout, no edema, no cyanosis no clubbing NEURO:  Cranial nerves II through XII grossly intact, motor grossly intact throughout PSYCH:   Cognitively intact, oriented to person place and time, depressed affect  EKG:  Sinus rhythm, rate 59, axis within normal limits, intervals within normal limits, no acute ST-T wave changes.  01/12/2014   ASSESSMENT AND PLAN  CAD:  She has had no new symptoms since her stress test in 2013  HTN:  Her BP is slightly elevated but it is not always at this level.  I will not titrate her meds.  She should keep a BP diary.   HYPERLIPIDEMIA:  No LDL in the system since 2012.  I will check a lipid profile today.   DEPRESSION:  I sent an email to 2013, MD to see if she needs further therapy for this.    DM:   Diabetes is well controlled.    Lab Results  Component Value Date   HGBA1C 6.4 08/27/2013

## 2014-01-13 ENCOUNTER — Encounter: Payer: Self-pay | Admitting: Internal Medicine

## 2014-01-19 ENCOUNTER — Other Ambulatory Visit: Payer: Self-pay | Admitting: *Deleted

## 2014-01-19 DIAGNOSIS — E7800 Pure hypercholesterolemia, unspecified: Secondary | ICD-10-CM

## 2014-01-19 DIAGNOSIS — Z79899 Other long term (current) drug therapy: Secondary | ICD-10-CM

## 2014-01-19 MED ORDER — PRAVASTATIN SODIUM 20 MG PO TABS: 20.0000 mg | ORAL_TABLET | Freq: Every day | ORAL | Status: DC

## 2014-01-20 ENCOUNTER — Ambulatory Visit: Payer: Medicare HMO | Admitting: Internal Medicine

## 2014-01-20 DIAGNOSIS — Z0289 Encounter for other administrative examinations: Secondary | ICD-10-CM

## 2014-02-01 ENCOUNTER — Ambulatory Visit (INDEPENDENT_AMBULATORY_CARE_PROVIDER_SITE_OTHER): Payer: Medicare HMO | Admitting: Internal Medicine

## 2014-02-01 ENCOUNTER — Encounter: Payer: Self-pay | Admitting: *Deleted

## 2014-02-01 ENCOUNTER — Encounter: Payer: Self-pay | Admitting: Internal Medicine

## 2014-02-01 ENCOUNTER — Other Ambulatory Visit (INDEPENDENT_AMBULATORY_CARE_PROVIDER_SITE_OTHER): Payer: Medicare HMO

## 2014-02-01 ENCOUNTER — Other Ambulatory Visit: Payer: Self-pay | Admitting: *Deleted

## 2014-02-01 ENCOUNTER — Ambulatory Visit: Payer: Medicare HMO | Admitting: Internal Medicine

## 2014-02-01 VITALS — BP 122/60 | HR 68 | Temp 98.7°F | Resp 16 | Wt 167.0 lb

## 2014-02-01 DIAGNOSIS — F329 Major depressive disorder, single episode, unspecified: Secondary | ICD-10-CM

## 2014-02-01 DIAGNOSIS — D649 Anemia, unspecified: Secondary | ICD-10-CM

## 2014-02-01 DIAGNOSIS — F3289 Other specified depressive episodes: Secondary | ICD-10-CM

## 2014-02-01 DIAGNOSIS — E538 Deficiency of other specified B group vitamins: Secondary | ICD-10-CM

## 2014-02-01 DIAGNOSIS — M353 Polymyalgia rheumatica: Secondary | ICD-10-CM

## 2014-02-01 DIAGNOSIS — E119 Type 2 diabetes mellitus without complications: Secondary | ICD-10-CM

## 2014-02-01 LAB — TSH: TSH: 1.22 u[IU]/mL (ref 0.35–5.50)

## 2014-02-01 LAB — URINALYSIS
Bilirubin Urine: NEGATIVE
HGB URINE DIPSTICK: NEGATIVE
Ketones, ur: NEGATIVE
Leukocytes, UA: NEGATIVE
NITRITE: NEGATIVE
SPECIFIC GRAVITY, URINE: 1.025 (ref 1.000–1.030)
Total Protein, Urine: NEGATIVE
URINE GLUCOSE: NEGATIVE
UROBILINOGEN UA: 0.2 (ref 0.0–1.0)
pH: 5.5 (ref 5.0–8.0)

## 2014-02-01 LAB — CBC WITH DIFFERENTIAL/PLATELET
BASOS PCT: 0.3 % (ref 0.0–3.0)
Basophils Absolute: 0 10*3/uL (ref 0.0–0.1)
EOS ABS: 0.2 10*3/uL (ref 0.0–0.7)
EOS PCT: 2.7 % (ref 0.0–5.0)
HCT: 31.5 % — ABNORMAL LOW (ref 36.0–46.0)
Hemoglobin: 10.2 g/dL — ABNORMAL LOW (ref 12.0–15.0)
LYMPHS PCT: 13.1 % (ref 12.0–46.0)
Lymphs Abs: 0.8 10*3/uL (ref 0.7–4.0)
MCHC: 32.5 g/dL (ref 30.0–36.0)
MCV: 93.1 fl (ref 78.0–100.0)
MONO ABS: 0.5 10*3/uL (ref 0.1–1.0)
Monocytes Relative: 7.8 % (ref 3.0–12.0)
Neutro Abs: 4.8 10*3/uL (ref 1.4–7.7)
Neutrophils Relative %: 76.1 % (ref 43.0–77.0)
PLATELETS: 245 10*3/uL (ref 150.0–400.0)
RBC: 3.39 Mil/uL — AB (ref 3.87–5.11)
RDW: 13.3 % (ref 11.5–14.6)
WBC: 6.3 10*3/uL (ref 4.5–10.5)

## 2014-02-01 LAB — BASIC METABOLIC PANEL
BUN: 40 mg/dL — AB (ref 6–23)
CHLORIDE: 104 meq/L (ref 96–112)
CO2: 24 mEq/L (ref 19–32)
Calcium: 9.8 mg/dL (ref 8.4–10.5)
Creatinine, Ser: 1.6 mg/dL — ABNORMAL HIGH (ref 0.4–1.2)
GFR: 41.2 mL/min — AB (ref >60.00)
GLUCOSE: 128 mg/dL — AB (ref 70–99)
Potassium: 4 mEq/L (ref 3.5–5.1)
SODIUM: 138 meq/L (ref 135–145)

## 2014-02-01 LAB — HEMOGLOBIN A1C: Hgb A1c MFr Bld: 6.8 % — ABNORMAL HIGH (ref 4.6–6.5)

## 2014-02-01 LAB — VITAMIN B12

## 2014-02-01 MED ORDER — PRAVASTATIN SODIUM 40 MG PO TABS: 40.0000 mg | ORAL_TABLET | Freq: Every day | ORAL | Status: DC

## 2014-02-01 MED ORDER — FLUOXETINE HCL 10 MG PO TABS: 10.0000 mg | ORAL_TABLET | Freq: Every day | ORAL | Status: DC

## 2014-02-01 NOTE — Assessment & Plan Note (Signed)
Continue with current prescription therapy as reflected on the Med list.  

## 2014-02-01 NOTE — Assessment & Plan Note (Signed)
Start Fluoxetine  

## 2014-02-01 NOTE — Progress Notes (Signed)
Pre visit review using our clinic review tool, if applicable. No additional management support is needed unless otherwise documented below in the visit note. 

## 2014-02-01 NOTE — Assessment & Plan Note (Signed)
CBC
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
## 2014-02-01 NOTE — Assessment & Plan Note (Signed)
Better on Prednisone 

## 2014-02-08 ENCOUNTER — Other Ambulatory Visit: Payer: Self-pay

## 2014-02-08 MED ORDER — PRAVASTATIN SODIUM 40 MG PO TABS: 40.0000 mg | ORAL_TABLET | Freq: Every day | ORAL | Status: DC

## 2014-02-11 ENCOUNTER — Telehealth: Payer: Self-pay | Admitting: Internal Medicine

## 2014-02-11 NOTE — Telephone Encounter (Signed)
Rec'd from [facility] forward 1 page to Dr. Plotnikov °

## 2014-02-12 ENCOUNTER — Other Ambulatory Visit: Payer: Self-pay

## 2014-02-12 MED ORDER — CARVEDILOL 25 MG PO TABS: 25.0000 mg | ORAL_TABLET | Freq: Two times a day (BID) | ORAL | Status: DC

## 2014-02-17 ENCOUNTER — Other Ambulatory Visit: Payer: Self-pay

## 2014-02-17 MED ORDER — FUROSEMIDE 20 MG PO TABS: 20.0000 mg | ORAL_TABLET | Freq: Every day | ORAL | Status: DC

## 2014-02-23 ENCOUNTER — Telehealth: Payer: Self-pay

## 2014-02-23 NOTE — Telephone Encounter (Signed)
error 

## 2014-02-24 ENCOUNTER — Ambulatory Visit (INDEPENDENT_AMBULATORY_CARE_PROVIDER_SITE_OTHER): Payer: Commercial Managed Care - HMO | Admitting: Internal Medicine

## 2014-02-24 ENCOUNTER — Encounter: Payer: Self-pay | Admitting: Internal Medicine

## 2014-02-24 VITALS — BP 148/72 | HR 80 | Temp 97.3°F | Resp 16 | Wt 173.0 lb

## 2014-02-24 DIAGNOSIS — I2510 Atherosclerotic heart disease of native coronary artery without angina pectoris: Secondary | ICD-10-CM

## 2014-02-24 DIAGNOSIS — E119 Type 2 diabetes mellitus without complications: Secondary | ICD-10-CM

## 2014-02-24 DIAGNOSIS — M2550 Pain in unspecified joint: Secondary | ICD-10-CM

## 2014-02-24 DIAGNOSIS — E538 Deficiency of other specified B group vitamins: Secondary | ICD-10-CM

## 2014-02-24 DIAGNOSIS — R5381 Other malaise: Secondary | ICD-10-CM

## 2014-02-24 DIAGNOSIS — R5383 Other fatigue: Secondary | ICD-10-CM

## 2014-02-24 MED ORDER — CYANOCOBALAMIN 1000 MCG/ML IJ SOLN
1000.0000 ug | Freq: Once | INTRAMUSCULAR | Status: AC
  Administered 2014-02-24: 1000 ug via INTRAMUSCULAR

## 2014-02-24 NOTE — Assessment & Plan Note (Signed)
Continue with current steroids prescription therapy as reflected on the Med list.

## 2014-02-24 NOTE — Progress Notes (Signed)
Pre visit review using our clinic review tool, if applicable. No additional management support is needed unless otherwise documented below in the visit note. 

## 2014-02-24 NOTE — Assessment & Plan Note (Signed)
SQ B12

## 2014-02-24 NOTE — Assessment & Plan Note (Signed)
Continue with current prescription therapy as reflected on the Med list.  
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
## 2014-03-14 ENCOUNTER — Emergency Department (HOSPITAL_COMMUNITY): Payer: Medicare HMO

## 2014-03-14 ENCOUNTER — Encounter (HOSPITAL_COMMUNITY): Payer: Self-pay | Admitting: Emergency Medicine

## 2014-03-14 ENCOUNTER — Emergency Department (HOSPITAL_COMMUNITY)
Admission: EM | Admit: 2014-03-14 | Discharge: 2014-03-14 | Disposition: A | Discharge status: Home / Self Care | Payer: Medicare HMO | Attending: Emergency Medicine | Admitting: Emergency Medicine

## 2014-03-14 DIAGNOSIS — K219 Gastro-esophageal reflux disease without esophagitis: Secondary | ICD-10-CM | POA: Insufficient documentation

## 2014-03-14 DIAGNOSIS — M130 Polyarthritis, unspecified: Secondary | ICD-10-CM | POA: Insufficient documentation

## 2014-03-14 DIAGNOSIS — R5381 Other malaise: Secondary | ICD-10-CM | POA: Insufficient documentation

## 2014-03-14 DIAGNOSIS — Z880 Allergy status to penicillin: Secondary | ICD-10-CM | POA: Insufficient documentation

## 2014-03-14 DIAGNOSIS — E785 Hyperlipidemia, unspecified: Secondary | ICD-10-CM | POA: Insufficient documentation

## 2014-03-14 DIAGNOSIS — I2510 Atherosclerotic heart disease of native coronary artery without angina pectoris: Secondary | ICD-10-CM | POA: Insufficient documentation

## 2014-03-14 DIAGNOSIS — R5383 Other fatigue: Secondary | ICD-10-CM

## 2014-03-14 DIAGNOSIS — R6889 Other general symptoms and signs: Secondary | ICD-10-CM | POA: Insufficient documentation

## 2014-03-14 DIAGNOSIS — R630 Anorexia: Secondary | ICD-10-CM | POA: Insufficient documentation

## 2014-03-14 DIAGNOSIS — Z8669 Personal history of other diseases of the nervous system and sense organs: Secondary | ICD-10-CM | POA: Insufficient documentation

## 2014-03-14 DIAGNOSIS — F3289 Other specified depressive episodes: Secondary | ICD-10-CM | POA: Insufficient documentation

## 2014-03-14 DIAGNOSIS — F329 Major depressive disorder, single episode, unspecified: Secondary | ICD-10-CM | POA: Insufficient documentation

## 2014-03-14 DIAGNOSIS — Z9861 Coronary angioplasty status: Secondary | ICD-10-CM | POA: Insufficient documentation

## 2014-03-14 DIAGNOSIS — E119 Type 2 diabetes mellitus without complications: Secondary | ICD-10-CM | POA: Insufficient documentation

## 2014-03-14 DIAGNOSIS — F410 Panic disorder [episodic paroxysmal anxiety] without agoraphobia: Secondary | ICD-10-CM | POA: Insufficient documentation

## 2014-03-14 DIAGNOSIS — M109 Gout, unspecified: Secondary | ICD-10-CM | POA: Insufficient documentation

## 2014-03-14 DIAGNOSIS — E669 Obesity, unspecified: Secondary | ICD-10-CM | POA: Insufficient documentation

## 2014-03-14 DIAGNOSIS — I10 Essential (primary) hypertension: Secondary | ICD-10-CM | POA: Insufficient documentation

## 2014-03-14 DIAGNOSIS — IMO0002 Reserved for concepts with insufficient information to code with codable children: Secondary | ICD-10-CM | POA: Insufficient documentation

## 2014-03-14 DIAGNOSIS — G4710 Hypersomnia, unspecified: Secondary | ICD-10-CM | POA: Insufficient documentation

## 2014-03-14 DIAGNOSIS — Z8739 Personal history of other diseases of the musculoskeletal system and connective tissue: Secondary | ICD-10-CM | POA: Insufficient documentation

## 2014-03-14 DIAGNOSIS — G8929 Other chronic pain: Secondary | ICD-10-CM | POA: Insufficient documentation

## 2014-03-14 DIAGNOSIS — Z86718 Personal history of other venous thrombosis and embolism: Secondary | ICD-10-CM | POA: Insufficient documentation

## 2014-03-14 DIAGNOSIS — E538 Deficiency of other specified B group vitamins: Secondary | ICD-10-CM | POA: Insufficient documentation

## 2014-03-14 DIAGNOSIS — D649 Anemia, unspecified: Secondary | ICD-10-CM | POA: Insufficient documentation

## 2014-03-14 DIAGNOSIS — Z79899 Other long term (current) drug therapy: Secondary | ICD-10-CM | POA: Insufficient documentation

## 2014-03-14 LAB — CBC WITH DIFFERENTIAL/PLATELET
BASOS PCT: 0 % (ref 0–1)
Basophils Absolute: 0 10*3/uL (ref 0.0–0.1)
EOS PCT: 1 % (ref 0–5)
Eosinophils Absolute: 0.1 10*3/uL (ref 0.0–0.7)
HEMATOCRIT: 33 % — AB (ref 36.0–46.0)
Hemoglobin: 10.5 g/dL — ABNORMAL LOW (ref 12.0–15.0)
Lymphocytes Relative: 20 % (ref 12–46)
Lymphs Abs: 1.1 10*3/uL (ref 0.7–4.0)
MCH: 30 pg (ref 26.0–34.0)
MCHC: 31.8 g/dL (ref 30.0–36.0)
MCV: 94.3 fL (ref 78.0–100.0)
MONO ABS: 0.3 10*3/uL (ref 0.1–1.0)
Monocytes Relative: 5 % (ref 3–12)
NEUTROS ABS: 4.2 10*3/uL (ref 1.7–7.7)
Neutrophils Relative %: 74 % (ref 43–77)
Platelets: 238 10*3/uL (ref 150–400)
RBC: 3.5 MIL/uL — ABNORMAL LOW (ref 3.87–5.11)
RDW: 12.6 % (ref 11.5–15.5)
WBC: 5.7 10*3/uL (ref 4.0–10.5)

## 2014-03-14 LAB — CBG MONITORING, ED: Glucose-Capillary: 90 mg/dL (ref 70–99)

## 2014-03-14 LAB — I-STAT CHEM 8, ED
BUN: 30 mg/dL — ABNORMAL HIGH (ref 6–23)
CALCIUM ION: 1.21 mmol/L (ref 1.13–1.30)
CHLORIDE: 107 meq/L (ref 96–112)
CREATININE: 1.3 mg/dL — AB (ref 0.50–1.10)
Glucose, Bld: 109 mg/dL — ABNORMAL HIGH (ref 70–99)
HEMATOCRIT: 35 % — AB (ref 36.0–46.0)
Hemoglobin: 11.9 g/dL — ABNORMAL LOW (ref 12.0–15.0)
Potassium: 4.3 mEq/L (ref 3.7–5.3)
Sodium: 140 mEq/L (ref 137–147)
TCO2: 22 mmol/L (ref 0–100)

## 2014-03-14 NOTE — ED Notes (Signed)
Pt updated on wait time.  
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
## 2014-03-14 NOTE — ED Notes (Signed)
Pt reports dizziness ongoing for some time. Pt reports usually occurs when she gets upset. Pt is tearful in triage. Pt reports having problems at home but not really talking about it.

## 2014-03-14 NOTE — Discharge Instructions (Signed)
Fatigue Fatigue is a feeling of tiredness, lack of energy, lack of motivation, or feeling tired all the time. Having enough rest, good nutrition, and reducing stress will normally reduce fatigue. Consult your caregiver if it persists. The nature of your fatigue will help your caregiver to find out its cause. The treatment is based on the cause.  CAUSES  There are many causes for fatigue. Most of the time, fatigue can be traced to one or more of your habits or routines. Most causes fit into one or more of three general areas. They are: Lifestyle problems  Sleep disturbances.  Overwork.  Physical exertion.  Unhealthy habits.  Poor eating habits or eating disorders.  Alcohol and/or drug use .  Lack of proper nutrition (malnutrition). Psychological problems  Stress and/or anxiety problems.  Depression.  Grief.  Boredom. Medical Problems or Conditions  Anemia.  Pregnancy.  Thyroid gland problems.  Recovery from major surgery.  Continuous pain.  Emphysema or asthma that is not well controlled  Allergic conditions.  Diabetes.  Infections (such as mononucleosis).  Obesity.  Sleep disorders, such as sleep apnea.  Heart failure or other heart-related problems.  Cancer.  Kidney disease.  Liver disease.  Effects of certain medicines such as antihistamines, cough and cold remedies, prescription pain medicines, heart and blood pressure medicines, drugs used for treatment of cancer, and some antidepressants. SYMPTOMS  The symptoms of fatigue include:   Lack of energy.  Lack of drive (motivation).  Drowsiness.  Feeling of indifference to the surroundings. DIAGNOSIS  The details of how you feel help guide your caregiver in finding out what is causing the fatigue. You will be asked about your present and past health condition. It is important to review all medicines that you take, including prescription and non-prescription items. A thorough exam will be done.  You will be questioned about your feelings, habits, and normal lifestyle. Your caregiver may suggest blood tests, urine tests, or other tests to look for common medical causes of fatigue.  TREATMENT  Fatigue is treated by correcting the underlying cause. For example, if you have continuous pain or depression, treating these causes will improve how you feel. Similarly, adjusting the dose of certain medicines will help in reducing fatigue.  HOME CARE INSTRUCTIONS   Try to get the required amount of good sleep every night.  Eat a healthy and nutritious diet, and drink enough water throughout the day.  Practice ways of relaxing (including yoga or meditation).  Exercise regularly.  Make plans to change situations that cause stress. Act on those plans so that stresses decrease over time. Keep your work and personal routine reasonable.  Avoid street drugs and minimize use of alcohol.  Start taking a daily multivitamin after consulting your caregiver. SEEK MEDICAL CARE IF:   You have persistent tiredness, which cannot be accounted for.  You have fever.  You have unintentional weight loss.  You have headaches.  You have disturbed sleep throughout the night.  You are feeling sad.  You have constipation.  You have dry skin.  You have gained weight.  You are taking any new or different medicines that you suspect are causing fatigue.  You are unable to sleep at night.  You develop any unusual swelling of your legs or other parts of your body. SEEK IMMEDIATE MEDICAL CARE IF:   You are feeling confused.  Your vision is blurred.  You feel faint or pass out.  You develop severe headache.  You develop severe abdominal, pelvic, or  back pain.  You develop chest pain, shortness of breath, or an irregular or fast heartbeat.  You are unable to pass a normal amount of urine.  You develop abnormal bleeding such as bleeding from the rectum or you vomit blood.  You have thoughts  about harming yourself or committing suicide.  You are worried that you might harm someone else. MAKE SURE YOU:   Understand these instructions.  Will watch your condition.  Will get help right away if you are not doing well or get worse. Document Released: 08/26/2007 Document Revised: 01/21/2012 Document Reviewed: 08/26/2007 [facility] Patient Information 2014 [place], [place]. Depression, Adult Depression refers to feeling sad, low, down in the dumps, blue, gloomy, or empty. In general, there are two kinds of depression: 1. Depression that we all experience from time to time because of upsetting life experiences, including the loss of a job or the ending of a relationship (normal sadness or normal grief). This kind of depression is considered normal, is short lived, and resolves within a few days to 2 weeks. (Depression experienced after the loss of a loved one is called bereavement. Bereavement often lasts longer than 2 weeks but normally gets better with time.) 2. Clinical depression, which lasts longer than normal sadness or normal grief or interferes with your ability to function at home, at work, and in school. It also interferes with your personal relationships. It affects almost every aspect of your life. Clinical depression is an illness. Symptoms of depression also can be caused by conditions other than normal sadness and grief or clinical depression. Examples of these conditions are listed as follows:  Physical illness Some physical illnesses, including underactive thyroid gland (hypothyroidism), severe anemia, specific types of cancer, diabetes, uncontrolled seizures, heart and lung problems, strokes, and chronic pain are commonly associated with symptoms of depression.  Side effects of some prescription medicine In some people, certain types of prescription medicine can cause symptoms of depression.  Substance abuse Abuse of alcohol and illicit drugs can cause symptoms of  depression. SYMPTOMS Symptoms of normal sadness and normal grief include the following:  Feeling sad or crying for short periods of time.  Not caring about anything (apathy).  Difficulty sleeping or sleeping too much.  No longer able to enjoy the things you used to enjoy.  Desire to be by oneself all the time (social isolation).  Lack of energy or motivation.  Difficulty concentrating or remembering.  Change in appetite or weight.  Restlessness or agitation. Symptoms of clinical depression include the same symptoms of normal sadness or normal grief and also the following symptoms:  Feeling sad or crying all the time.  Feelings of guilt or worthlessness.  Feelings of hopelessness or helplessness.  Thoughts of suicide or the desire to harm yourself (suicidal ideation).  Loss of touch with reality (psychotic symptoms). Seeing or hearing things that are not real (hallucinations) or having false beliefs about your life or the people around you (delusions and paranoia). DIAGNOSIS  The diagnosis of clinical depression usually is based on the severity and duration of the symptoms. Your caregiver also will ask you questions about your medical history and substance use to find out if physical illness, use of prescription medicine, or substance abuse is causing your depression. Your caregiver also may order blood tests. TREATMENT  Typically, normal sadness and normal grief do not require treatment. However, sometimes antidepressant medicine is prescribed for bereavement to ease the depressive symptoms until they resolve. The treatment for clinical depression depends on  the severity of your symptoms but typically includes antidepressant medicine, counseling with a mental health professional, or a combination of both. Your caregiver will help to determine what treatment is best for you. Depression caused by physical illness usually goes away with appropriate medical treatment of the illness.  If prescription medicine is causing depression, talk with your caregiver about stopping the medicine, decreasing the dose, or substituting another medicine. Depression caused by abuse of alcohol or illicit drugs abuse goes away with abstinence from these substances. Some adults need professional help in order to stop drinking or using drugs. SEEK IMMEDIATE CARE IF:  You have thoughts about hurting yourself or others.  You lose touch with reality (have psychotic symptoms).  You are taking medicine for depression and have a serious side effect. FOR MORE INFORMATION National [employer] on Mental Illness: www.nami.[employer]: http://www.maynard.net/ Document Released: 10/26/2000 Document Revised: 04/29/2012 Document Reviewed: 01/28/2012 [facility] Patient Information 2014 [place], [place].

## 2014-03-14 NOTE — ED Notes (Signed)
Patient transported to X-ray 

## 2014-03-16 LAB — CBG MONITORING, ED: Glucose-Capillary: 119 mg/dL — ABNORMAL HIGH (ref 70–99)
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
## 2014-03-23 ENCOUNTER — Other Ambulatory Visit: Payer: Medicare HMO

## 2014-03-30 ENCOUNTER — Other Ambulatory Visit (INDEPENDENT_AMBULATORY_CARE_PROVIDER_SITE_OTHER): Payer: Medicare HMO

## 2014-03-30 DIAGNOSIS — Z79899 Other long term (current) drug therapy: Secondary | ICD-10-CM

## 2014-03-30 DIAGNOSIS — E7800 Pure hypercholesterolemia, unspecified: Secondary | ICD-10-CM

## 2014-03-30 LAB — LIPID PANEL
CHOL/HDL RATIO: 3
Cholesterol: 209 mg/dL — ABNORMAL HIGH (ref 0–200)
HDL: 67 mg/dL (ref >39.00)
LDL CALC: 128 mg/dL — AB (ref 0–99)
Triglycerides: 68 mg/dL (ref 0.0–149.0)
VLDL: 13.6 mg/dL (ref 0.0–40.0)

## 2014-03-30 LAB — HEPATIC FUNCTION PANEL
ALK PHOS: 47 U/L (ref 39–117)
ALT: 8 U/L (ref 0–35)
AST: 19 U/L (ref 0–37)
Albumin: 4 g/dL (ref 3.5–5.2)
BILIRUBIN DIRECT: 0.2 mg/dL (ref 0.0–0.3)
BILIRUBIN TOTAL: 0.7 mg/dL (ref 0.2–1.2)
Total Protein: 7.6 g/dL (ref 6.0–8.3)

## 2014-03-30 NOTE — Progress Notes (Signed)
Quick Note:  Preliminary report reviewed by triage nurse and sent to MD desk. ______ 

## 2014-04-06 ENCOUNTER — Telehealth: Payer: Self-pay | Admitting: Nurse Practitioner

## 2014-04-06 NOTE — Telephone Encounter (Signed)
Called patient to report lab results: Notes Recorded by Rollene Rotunda, MD on 03/31/2014 at 8:00 AM She has been intolerant of Pravachol in the past. Would she try Zocor 20 mg daily? If so one po daily. Disp number 31 with 11 refills. Call Ms. Early with the results  Patient states she continues to have fatigue, nausea, dizziness, and nervousness.  She is unsure whether or not she wants to start Zocor.  Patient states she does not have Pravachol in her home (epic shows Rx sent 3/30 to [facility]).  Patient states she just wants to feel better; states she will come in and see Dr. Antoine Poche if needed.  I advised patient I am routing message to Dr. Antoine Poche and his primary nurse, Avie Arenas, RN.  Patient verbalized understanding and agreement.

## 2014-04-07 NOTE — Telephone Encounter (Signed)
The patient has multiple joint pains and other issues as outlined in my previous note.  Given this I would suggest follow up with [name], MD for management of multiple medical issues before we consider changing/adding a statin.

## 2014-04-09 NOTE — Telephone Encounter (Signed)
Reviewed cholesterol results and recommendations with pt again.  She will call her PCP for evaluation of joint pain.  She will also talk to her about her c/o constant fatigue.

## 2014-04-12 ENCOUNTER — Encounter: Payer: Self-pay | Admitting: Internal Medicine

## 2014-04-12 ENCOUNTER — Ambulatory Visit (INDEPENDENT_AMBULATORY_CARE_PROVIDER_SITE_OTHER): Payer: Medicare HMO | Admitting: Internal Medicine

## 2014-04-12 VITALS — BP 150/74 | HR 76 | Temp 97.8°F | Resp 16 | Wt 164.0 lb

## 2014-04-12 DIAGNOSIS — F329 Major depressive disorder, single episode, unspecified: Secondary | ICD-10-CM

## 2014-04-12 DIAGNOSIS — N259 Disorder resulting from impaired renal tubular function, unspecified: Secondary | ICD-10-CM

## 2014-04-12 DIAGNOSIS — R634 Abnormal weight loss: Secondary | ICD-10-CM | POA: Insufficient documentation

## 2014-04-12 DIAGNOSIS — E119 Type 2 diabetes mellitus without complications: Secondary | ICD-10-CM

## 2014-04-12 DIAGNOSIS — I509 Heart failure, unspecified: Secondary | ICD-10-CM

## 2014-04-12 DIAGNOSIS — E538 Deficiency of other specified B group vitamins: Secondary | ICD-10-CM

## 2014-04-12 DIAGNOSIS — I2510 Atherosclerotic heart disease of native coronary artery without angina pectoris: Secondary | ICD-10-CM

## 2014-04-12 DIAGNOSIS — I129 Hypertensive chronic kidney disease with stage 1 through stage 4 chronic kidney disease, or unspecified chronic kidney disease: Secondary | ICD-10-CM

## 2014-04-12 DIAGNOSIS — M353 Polymyalgia rheumatica: Secondary | ICD-10-CM

## 2014-04-12 DIAGNOSIS — F3289 Other specified depressive episodes: Secondary | ICD-10-CM

## 2014-04-12 DIAGNOSIS — I130 Hypertensive heart and chronic kidney disease with heart failure and stage 1 through stage 4 chronic kidney disease, or unspecified chronic kidney disease: Secondary | ICD-10-CM

## 2014-04-12 MED ORDER — METHYLPREDNISOLONE ACETATE 80 MG/ML IJ SUSP
80.0000 mg | Freq: Once | INTRAMUSCULAR | Status: AC
  Administered 2014-04-12: 80 mg via INTRAMUSCULAR

## 2014-04-12 MED ORDER — FLUOXETINE HCL 10 MG PO TABS: 10.0000 mg | ORAL_TABLET | Freq: Every day | ORAL | Status: DC

## 2014-04-12 NOTE — Progress Notes (Signed)
Pre visit review using our clinic review tool, if applicable. No additional management support is needed unless otherwise documented below in the visit note. 

## 2014-04-12 NOTE — Assessment & Plan Note (Signed)
Continue with current prescription therapy as reflected on the Med list.  

## 2014-04-12 NOTE — Assessment & Plan Note (Signed)
Better  

## 2014-04-12 NOTE — Assessment & Plan Note (Signed)
Labs

## 2014-04-12 NOTE — Patient Instructions (Signed)
Please start Fluoxetine

## 2014-04-12 NOTE — Assessment & Plan Note (Signed)
Wt Readings from Last 3 Encounters:  04/12/14 164 lb (74.39 kg)  03/14/14 172 lb (78.019 kg)  02/24/14 173 lb (78.472 kg)

## 2014-04-12 NOTE — Assessment & Plan Note (Signed)
We started Fluoxetine in 3/15 - not taking it for ?reason. 5/15 Risks associated with treatment noncompliance were discussed again. Compliance was encouraged again.

## 2014-04-12 NOTE — Assessment & Plan Note (Addendum)
Not taking Prednsone

## 2014-04-13 ENCOUNTER — Telehealth: Payer: Self-pay | Admitting: Cardiology
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
## 2014-04-13 NOTE — Telephone Encounter (Signed)
New message     Pt is on carvedilol prescribed by Dr Posey Rea.  Dr Antoine Poche took her off this medication.  She want to know why he took her off.  Should Dr Posey Rea take her off?

## 2014-04-13 NOTE — Telephone Encounter (Signed)
Pt states her heart doctor had taken her off of coreg but Dr Posey Rea had told her to take it. She states it makes her feel tired and bad. She saw Dr Posey Rea yesterday but forgot to ask him about it. She is going to call Dr Posey Rea and ask him about it today.

## 2014-04-20 ENCOUNTER — Telehealth: Payer: Self-pay | Admitting: Cardiology

## 2014-04-20 NOTE — Telephone Encounter (Signed)
New Message  Pt called states that she is taking the medication carvedilol that is giving her a bad reatction.. Pt requests a call back to discuss//SR

## 2014-04-21 NOTE — Telephone Encounter (Signed)
Called pt back  NA not voicemail.  Will continue to attempt to contact.

## 2014-04-22 NOTE — Telephone Encounter (Signed)
NA at number listed - will continue to attempt to contact.

## 2014-04-23 NOTE — Telephone Encounter (Signed)
Was finally able to contact the pt.  She states she feels horrible all the time on carvedilol and does not want to take it.  She wants to know if there is something else she can take in place of it.  She reports reactions to multiple medications including atenolol.  She describe extreme fatigue.  Aware I will review with Dr Antoine Poche and call her back.

## 2014-04-25 NOTE — Telephone Encounter (Signed)
Given her multiple medication intolerances she should just stay off the beta blocker.

## 2014-04-26 NOTE — Telephone Encounter (Signed)
Advised pt of Dr Hochrein's instructions to remain off the beta blocker. She states understanding but wants to know what else we are going to give her for her heart.  Instructed at this time Dr Iona Coach is not going to be starting any medications.  She voiced disappointment that she should be started on something else for her heart.  Explained to pt that she has intolerances to many different medications and at this point in time Dr Antoine Poche needs to see if she feels better off the beta blocker.  She should call back if symptoms do not improve after being off of the carvedilol.

## 2014-05-04 ENCOUNTER — Ambulatory Visit: Payer: Medicare HMO | Admitting: Internal Medicine

## 2014-05-13 ENCOUNTER — Other Ambulatory Visit: Payer: Self-pay | Admitting: *Deleted

## 2014-05-13 MED ORDER — METFORMIN HCL 500 MG PO TABS: 500.0000 mg | ORAL_TABLET | Freq: Two times a day (BID) | ORAL | Status: DC

## 2014-05-13 NOTE — Telephone Encounter (Signed)
Left msg on triage needing a 30 day on her metformin. Ordered her mail order late and haven't received. Called pt back left msg with her husband will send to [facility]...Raechel Chute

## 2014-05-14 ENCOUNTER — Other Ambulatory Visit: Payer: Self-pay | Admitting: Internal Medicine

## 2014-05-26 ENCOUNTER — Ambulatory Visit (INDEPENDENT_AMBULATORY_CARE_PROVIDER_SITE_OTHER): Payer: Medicare HMO | Admitting: Internal Medicine

## 2014-05-26 ENCOUNTER — Encounter: Payer: Self-pay | Admitting: Internal Medicine

## 2014-05-26 VITALS — BP 160/70 | HR 76 | Temp 98.5°F | Resp 16 | Wt 162.0 lb

## 2014-05-26 DIAGNOSIS — R634 Abnormal weight loss: Secondary | ICD-10-CM

## 2014-05-26 DIAGNOSIS — E119 Type 2 diabetes mellitus without complications: Secondary | ICD-10-CM

## 2014-05-26 DIAGNOSIS — F3289 Other specified depressive episodes: Secondary | ICD-10-CM

## 2014-05-26 DIAGNOSIS — F329 Major depressive disorder, single episode, unspecified: Secondary | ICD-10-CM

## 2014-05-26 DIAGNOSIS — E538 Deficiency of other specified B group vitamins: Secondary | ICD-10-CM

## 2014-05-26 DIAGNOSIS — M353 Polymyalgia rheumatica: Secondary | ICD-10-CM

## 2014-05-26 MED ORDER — CYANOCOBALAMIN 1000 MCG/ML IJ SOLN
1000.0000 ug | Freq: Once | INTRAMUSCULAR | Status: AC
  Administered 2014-05-26: 1000 ug via INTRAMUSCULAR

## 2014-05-26 MED ORDER — METHYLPREDNISOLONE ACETATE 80 MG/ML IJ SUSP
80.0000 mg | Freq: Once | INTRAMUSCULAR | Status: AC
  Administered 2014-05-26: 80 mg via INTRAMUSCULAR

## 2014-05-26 NOTE — Assessment & Plan Note (Signed)
Depomedrol shots  make her feel better x 2-3 wks - pt is agreeable to have shots

## 2014-05-26 NOTE — Assessment & Plan Note (Signed)
Continue with current prescription therapy as reflected on the Med list.  

## 2014-05-26 NOTE — Assessment & Plan Note (Signed)
Could be PMR related

## 2014-05-26 NOTE — Progress Notes (Signed)
Pre visit review using our clinic review tool, if applicable. No additional management support is needed unless otherwise documented below in the visit note. 
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
## 2014-05-26 NOTE — Assessment & Plan Note (Signed)
Re-start Fluoxetine

## 2014-05-27 ENCOUNTER — Emergency Department (HOSPITAL_COMMUNITY): Payer: Medicare HMO

## 2014-05-27 ENCOUNTER — Emergency Department (HOSPITAL_COMMUNITY)
Admission: EM | Admit: 2014-05-27 | Discharge: 2014-05-27 | Disposition: A | Discharge status: Home / Self Care | Payer: Medicare HMO | Attending: Emergency Medicine | Admitting: Emergency Medicine

## 2014-05-27 ENCOUNTER — Encounter (HOSPITAL_COMMUNITY): Payer: Self-pay | Admitting: Emergency Medicine

## 2014-05-27 DIAGNOSIS — M5126 Other intervertebral disc displacement, lumbar region: Secondary | ICD-10-CM | POA: Insufficient documentation

## 2014-05-27 DIAGNOSIS — S7002XA Contusion of left hip, initial encounter: Secondary | ICD-10-CM

## 2014-05-27 DIAGNOSIS — I10 Essential (primary) hypertension: Secondary | ICD-10-CM | POA: Insufficient documentation

## 2014-05-27 DIAGNOSIS — I2510 Atherosclerotic heart disease of native coronary artery without angina pectoris: Secondary | ICD-10-CM | POA: Insufficient documentation

## 2014-05-27 DIAGNOSIS — Z880 Allergy status to penicillin: Secondary | ICD-10-CM | POA: Insufficient documentation

## 2014-05-27 DIAGNOSIS — Z79899 Other long term (current) drug therapy: Secondary | ICD-10-CM | POA: Insufficient documentation

## 2014-05-27 DIAGNOSIS — Z7982 Long term (current) use of aspirin: Secondary | ICD-10-CM | POA: Insufficient documentation

## 2014-05-27 DIAGNOSIS — E785 Hyperlipidemia, unspecified: Secondary | ICD-10-CM | POA: Insufficient documentation

## 2014-05-27 DIAGNOSIS — Y9389 Activity, other specified: Secondary | ICD-10-CM | POA: Insufficient documentation

## 2014-05-27 DIAGNOSIS — Z862 Personal history of diseases of the blood and blood-forming organs and certain disorders involving the immune mechanism: Secondary | ICD-10-CM | POA: Insufficient documentation

## 2014-05-27 DIAGNOSIS — Z86718 Personal history of other venous thrombosis and embolism: Secondary | ICD-10-CM | POA: Insufficient documentation

## 2014-05-27 DIAGNOSIS — S7012XA Contusion of left thigh, initial encounter: Secondary | ICD-10-CM

## 2014-05-27 DIAGNOSIS — G8929 Other chronic pain: Secondary | ICD-10-CM | POA: Insufficient documentation

## 2014-05-27 DIAGNOSIS — E119 Type 2 diabetes mellitus without complications: Secondary | ICD-10-CM | POA: Insufficient documentation

## 2014-05-27 DIAGNOSIS — Z87448 Personal history of other diseases of urinary system: Secondary | ICD-10-CM | POA: Insufficient documentation

## 2014-05-27 DIAGNOSIS — F329 Major depressive disorder, single episode, unspecified: Secondary | ICD-10-CM | POA: Insufficient documentation

## 2014-05-27 DIAGNOSIS — S6990XA Unspecified injury of unspecified wrist, hand and finger(s), initial encounter: Secondary | ICD-10-CM | POA: Insufficient documentation

## 2014-05-27 DIAGNOSIS — Z8719 Personal history of other diseases of the digestive system: Secondary | ICD-10-CM | POA: Insufficient documentation

## 2014-05-27 DIAGNOSIS — F411 Generalized anxiety disorder: Secondary | ICD-10-CM | POA: Insufficient documentation

## 2014-05-27 DIAGNOSIS — E669 Obesity, unspecified: Secondary | ICD-10-CM | POA: Insufficient documentation

## 2014-05-27 DIAGNOSIS — S7010XA Contusion of unspecified thigh, initial encounter: Secondary | ICD-10-CM | POA: Insufficient documentation

## 2014-05-27 DIAGNOSIS — Y929 Unspecified place or not applicable: Secondary | ICD-10-CM | POA: Insufficient documentation

## 2014-05-27 DIAGNOSIS — W1809XA Striking against other object with subsequent fall, initial encounter: Secondary | ICD-10-CM | POA: Insufficient documentation

## 2014-05-27 DIAGNOSIS — F3289 Other specified depressive episodes: Secondary | ICD-10-CM | POA: Insufficient documentation

## 2014-05-27 LAB — COMPREHENSIVE METABOLIC PANEL
ALT: 7 U/L (ref 0–35)
AST: 18 U/L (ref 0–37)
Albumin: 4.3 g/dL (ref 3.5–5.2)
Alkaline Phosphatase: 62 U/L (ref 39–117)
Anion gap: 15 (ref 5–15)
BUN: 27 mg/dL — AB (ref 6–23)
CALCIUM: 10.1 mg/dL (ref 8.4–10.5)
CHLORIDE: 103 meq/L (ref 96–112)
CO2: 21 mEq/L (ref 19–32)
Creatinine, Ser: 1.1 mg/dL (ref 0.50–1.10)
GFR calc Af Amer: 54 mL/min — ABNORMAL LOW (ref >90)
GFR calc non Af Amer: 46 mL/min — ABNORMAL LOW (ref >90)
Glucose, Bld: 78 mg/dL (ref 70–99)
Potassium: 4.5 mEq/L (ref 3.7–5.3)
Sodium: 139 mEq/L (ref 137–147)
Total Bilirubin: 0.5 mg/dL (ref 0.3–1.2)
Total Protein: 8.5 g/dL — ABNORMAL HIGH (ref 6.0–8.3)

## 2014-05-27 LAB — I-STAT TROPONIN, ED: TROPONIN I, POC: 0.02 ng/mL (ref 0.00–0.08)

## 2014-05-27 LAB — CBC WITH DIFFERENTIAL/PLATELET
BASOS ABS: 0 10*3/uL (ref 0.0–0.1)
BASOS PCT: 0 % (ref 0–1)
EOS PCT: 2 % (ref 0–5)
Eosinophils Absolute: 0.1 10*3/uL (ref 0.0–0.7)
HEMATOCRIT: 32.6 % — AB (ref 36.0–46.0)
HEMOGLOBIN: 10.2 g/dL — AB (ref 12.0–15.0)
Lymphocytes Relative: 19 % (ref 12–46)
Lymphs Abs: 1.2 10*3/uL (ref 0.7–4.0)
MCH: 28.8 pg (ref 26.0–34.0)
MCHC: 31.3 g/dL (ref 30.0–36.0)
MCV: 92.1 fL (ref 78.0–100.0)
MONO ABS: 0.5 10*3/uL (ref 0.1–1.0)
MONOS PCT: 8 % (ref 3–12)
Neutro Abs: 4.5 10*3/uL (ref 1.7–7.7)
Neutrophils Relative %: 71 % (ref 43–77)
Platelets: 252 10*3/uL (ref 150–400)
RBC: 3.54 MIL/uL — ABNORMAL LOW (ref 3.87–5.11)
RDW: 12.6 % (ref 11.5–15.5)
WBC: 6.3 10*3/uL (ref 4.0–10.5)

## 2014-05-27 LAB — CBG MONITORING, ED
GLUCOSE-CAPILLARY: 84 mg/dL (ref 70–99)
Glucose-Capillary: 73 mg/dL (ref 70–99)

## 2014-05-27 MED ORDER — ONDANSETRON HCL 4 MG/2ML IJ SOLN
4.0000 mg | Freq: Once | INTRAMUSCULAR | Status: AC
  Administered 2014-05-27: 4 mg via INTRAVENOUS
  Filled 2014-05-27: qty 2

## 2014-05-27 MED ORDER — MORPHINE SULFATE 4 MG/ML IJ SOLN
4.0000 mg | Freq: Once | INTRAMUSCULAR | Status: AC
  Administered 2014-05-27: 4 mg via INTRAVENOUS
  Filled 2014-05-27: qty 1

## 2014-05-27 NOTE — ED Notes (Signed)
Bladder scan showed 41 ml. Dr. Silverio Lay at bedside.
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
## 2014-05-27 NOTE — Discharge Instructions (Signed)
Take tylenol, motrin for pain.   Follow up with your doctor or neurosurgery for MRI in a week.   Return to ER if you have trouble walking, severe pain, weakness, numbness, trouble urinating.

## 2014-05-27 NOTE — ED Notes (Signed)
Bed: QM57 Expected date:  Expected time:  Means of arrival:  Comments: EMS- fall, LSB, hip pain

## 2014-05-27 NOTE — ED Notes (Signed)
Pt escorted to discharge window. Verbalized understanding discharge instructions. In no acute distress.   
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
## 2014-05-27 NOTE — ED Notes (Signed)
Lumbar board removed by Dr. Thurnell Lose.

## 2014-06-04 ENCOUNTER — Ambulatory Visit: Payer: Commercial Managed Care - HMO | Admitting: Internal Medicine

## 2014-06-24 ENCOUNTER — Ambulatory Visit (INDEPENDENT_AMBULATORY_CARE_PROVIDER_SITE_OTHER): Payer: Commercial Managed Care - HMO | Admitting: Internal Medicine

## 2014-06-24 ENCOUNTER — Encounter: Payer: Self-pay | Admitting: Internal Medicine

## 2014-06-24 VITALS — BP 144/96 | HR 80 | Temp 98.1°F | Resp 16 | Wt 156.0 lb

## 2014-06-24 DIAGNOSIS — S60222D Contusion of left hand, subsequent encounter: Secondary | ICD-10-CM

## 2014-06-24 DIAGNOSIS — E119 Type 2 diabetes mellitus without complications: Secondary | ICD-10-CM

## 2014-06-24 DIAGNOSIS — M353 Polymyalgia rheumatica: Secondary | ICD-10-CM

## 2014-06-24 DIAGNOSIS — W1809XD Striking against other object with subsequent fall, subsequent encounter: Secondary | ICD-10-CM

## 2014-06-24 DIAGNOSIS — F329 Major depressive disorder, single episode, unspecified: Secondary | ICD-10-CM

## 2014-06-24 DIAGNOSIS — Z5189 Encounter for other specified aftercare: Secondary | ICD-10-CM

## 2014-06-24 DIAGNOSIS — S7000XA Contusion of unspecified hip, initial encounter: Secondary | ICD-10-CM

## 2014-06-24 DIAGNOSIS — S7002XD Contusion of left hip, subsequent encounter: Secondary | ICD-10-CM

## 2014-06-24 DIAGNOSIS — M545 Low back pain, unspecified: Secondary | ICD-10-CM

## 2014-06-24 DIAGNOSIS — S7002XA Contusion of left hip, initial encounter: Secondary | ICD-10-CM | POA: Insufficient documentation

## 2014-06-24 DIAGNOSIS — F3289 Other specified depressive episodes: Secondary | ICD-10-CM

## 2014-06-24 DIAGNOSIS — S60229A Contusion of unspecified hand, initial encounter: Secondary | ICD-10-CM

## 2014-06-24 DIAGNOSIS — N259 Disorder resulting from impaired renal tubular function, unspecified: Secondary | ICD-10-CM

## 2014-06-24 DIAGNOSIS — S60222A Contusion of left hand, initial encounter: Secondary | ICD-10-CM | POA: Insufficient documentation

## 2014-06-24 DIAGNOSIS — W1800XA Striking against unspecified object with subsequent fall, initial encounter: Secondary | ICD-10-CM | POA: Insufficient documentation

## 2014-06-24 LAB — GLUCOSE, POCT (MANUAL RESULT ENTRY): POC GLUCOSE: 113 mg/dL — AB (ref 70–99)

## 2014-06-24 MED ORDER — METHYLPREDNISOLONE ACETATE 80 MG/ML IJ SUSP
80.0000 mg | Freq: Once | INTRAMUSCULAR | Status: AC
  Administered 2014-06-24: 80 mg via INTRAMUSCULAR

## 2014-06-24 NOTE — Assessment & Plan Note (Signed)
05/27/14 s/p fall

## 2014-06-24 NOTE — Assessment & Plan Note (Signed)
05/27/14 s/p fall 
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
## 2014-06-24 NOTE — Assessment & Plan Note (Signed)
monthly Depomedrol 80 mg IM
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
## 2014-06-24 NOTE — Progress Notes (Signed)
Pre visit review using our clinic review tool, if applicable. No additional management support is needed unless otherwise documented below in the visit note. 

## 2014-06-27 NOTE — Assessment & Plan Note (Signed)
Monitor labs 

## 2014-06-27 NOTE — Assessment & Plan Note (Signed)
Continue with current prescription therapy as reflected on the Med list.  
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
## 2014-07-01 ENCOUNTER — Telehealth: Payer: Self-pay | Admitting: *Deleted

## 2014-07-01 NOTE — Telephone Encounter (Signed)
I reviewed meds: not very likely due to current meds Pls do not stop meds - keep ROV Thx

## 2014-07-01 NOTE — Telephone Encounter (Signed)
Pt states she is having a lot of hair loss, it is very depressing coming out by the hand full. Wanting to ask md could it be coming from her medications. What can she do to prevent hair loss...[name]
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
## 2014-07-02 NOTE — Telephone Encounter (Signed)
Notified pt with md response.../lmb 

## 2014-07-22 ENCOUNTER — Ambulatory Visit (INDEPENDENT_AMBULATORY_CARE_PROVIDER_SITE_OTHER): Payer: Commercial Managed Care - HMO | Admitting: Internal Medicine

## 2014-07-22 ENCOUNTER — Encounter: Payer: Self-pay | Admitting: Internal Medicine

## 2014-07-22 ENCOUNTER — Other Ambulatory Visit (INDEPENDENT_AMBULATORY_CARE_PROVIDER_SITE_OTHER): Payer: Commercial Managed Care - HMO

## 2014-07-22 VITALS — BP 152/82 | HR 77 | Temp 98.2°F | Wt 153.8 lb

## 2014-07-22 DIAGNOSIS — M2550 Pain in unspecified joint: Secondary | ICD-10-CM

## 2014-07-22 DIAGNOSIS — M353 Polymyalgia rheumatica: Secondary | ICD-10-CM

## 2014-07-22 DIAGNOSIS — R5381 Other malaise: Secondary | ICD-10-CM

## 2014-07-22 DIAGNOSIS — N259 Disorder resulting from impaired renal tubular function, unspecified: Secondary | ICD-10-CM

## 2014-07-22 DIAGNOSIS — D638 Anemia in other chronic diseases classified elsewhere: Secondary | ICD-10-CM

## 2014-07-22 DIAGNOSIS — E119 Type 2 diabetes mellitus without complications: Secondary | ICD-10-CM

## 2014-07-22 DIAGNOSIS — I10 Essential (primary) hypertension: Secondary | ICD-10-CM

## 2014-07-22 DIAGNOSIS — E538 Deficiency of other specified B group vitamins: Secondary | ICD-10-CM

## 2014-07-22 DIAGNOSIS — R5383 Other fatigue: Secondary | ICD-10-CM

## 2014-07-22 DIAGNOSIS — W1809XA Striking against other object with subsequent fall, initial encounter: Secondary | ICD-10-CM

## 2014-07-22 DIAGNOSIS — W1809XD Striking against other object with subsequent fall, subsequent encounter: Secondary | ICD-10-CM

## 2014-07-22 LAB — CBC WITH DIFFERENTIAL/PLATELET
Basophils Absolute: 0 10*3/uL (ref 0.0–0.1)
Basophils Relative: 0.6 % (ref 0.0–3.0)
Eosinophils Absolute: 0.1 10*3/uL (ref 0.0–0.7)
Eosinophils Relative: 2 % (ref 0.0–5.0)
HEMATOCRIT: 32.1 % — AB (ref 36.0–46.0)
Hemoglobin: 10.4 g/dL — ABNORMAL LOW (ref 12.0–15.0)
LYMPHS ABS: 1 10*3/uL (ref 0.7–4.0)
Lymphocytes Relative: 19.9 % (ref 12.0–46.0)
MCHC: 32.5 g/dL (ref 30.0–36.0)
MCV: 90.7 fl (ref 78.0–100.0)
MONO ABS: 0.3 10*3/uL (ref 0.1–1.0)
Monocytes Relative: 6.3 % (ref 3.0–12.0)
Neutro Abs: 3.5 10*3/uL (ref 1.4–7.7)
Neutrophils Relative %: 71.2 % (ref 43.0–77.0)
Platelets: 277 10*3/uL (ref 150.0–400.0)
RBC: 3.54 Mil/uL — ABNORMAL LOW (ref 3.87–5.11)
RDW: 13.3 % (ref 11.5–15.5)
WBC: 5 10*3/uL (ref 4.0–10.5)

## 2014-07-22 LAB — LIPID PANEL
CHOLESTEROL: 213 mg/dL — AB (ref 0–200)
HDL: 62.2 mg/dL (ref >39.00)
LDL Cholesterol: 131 mg/dL — ABNORMAL HIGH (ref 0–99)
NONHDL: 150.8
Total CHOL/HDL Ratio: 3
Triglycerides: 99 mg/dL (ref 0.0–149.0)
VLDL: 19.8 mg/dL (ref 0.0–40.0)

## 2014-07-22 LAB — BASIC METABOLIC PANEL
BUN: 30 mg/dL — AB (ref 6–23)
CHLORIDE: 106 meq/L (ref 96–112)
CO2: 24 mEq/L (ref 19–32)
Calcium: 9.6 mg/dL (ref 8.4–10.5)
Creatinine, Ser: 1.1 mg/dL (ref 0.4–1.2)
GFR: 62.9 mL/min (ref >60.00)
Glucose, Bld: 87 mg/dL (ref 70–99)
POTASSIUM: 4.3 meq/L (ref 3.5–5.1)
Sodium: 139 mEq/L (ref 135–145)

## 2014-07-22 LAB — HEMOGLOBIN A1C: Hgb A1c MFr Bld: 6 % (ref 4.6–6.5)

## 2014-07-22 LAB — TSH: TSH: 1.74 u[IU]/mL (ref 0.35–4.50)

## 2014-07-22 LAB — SEDIMENTATION RATE: Sed Rate: 90 mm/hr — ABNORMAL HIGH (ref 0–22)

## 2014-07-22 MED ORDER — METHYLPREDNISOLONE ACETATE 80 MG/ML IJ SUSP
80.0000 mg | Freq: Once | INTRAMUSCULAR | Status: AC
  Administered 2014-07-22: 80 mg via INTRAMUSCULAR
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
## 2014-07-22 NOTE — Assessment & Plan Note (Signed)
Continue with current prescription therapy as reflected on the Med list. Labs  

## 2014-07-22 NOTE — Assessment & Plan Note (Signed)
Labs

## 2014-07-22 NOTE — Assessment & Plan Note (Signed)
Declined po prednisone - monthly Depomedrol 80 mg IM  Potential benefits of a long term steroid  use as well as potential risks  and complications were explained to the patient and were aknowledged.

## 2014-07-22 NOTE — Assessment & Plan Note (Signed)
L hand and L hip contusions - better Continue with current prn therapy as reflected on the Med list.

## 2014-07-22 NOTE — Progress Notes (Signed)
Pre visit review using our clinic review tool, if applicable. No additional management support is needed unless otherwise documented below in the visit note. 

## 2014-07-27 ENCOUNTER — Telehealth: Payer: Self-pay | Admitting: *Deleted

## 2014-07-27 NOTE — Telephone Encounter (Signed)
Pt is requesting results back from labs done 07/22/14. She states she never receive...[name]

## 2014-07-27 NOTE — Telephone Encounter (Signed)
Notified pt with md response.../lmb 

## 2014-07-27 NOTE — Telephone Encounter (Signed)
Labs are stable Thx 

## 2014-08-11 ENCOUNTER — Telehealth: Payer: Self-pay | Admitting: Internal Medicine

## 2014-08-11 NOTE — Telephone Encounter (Signed)
[name] from [facility] called in and is needing a referral,     [name] Fax-[phone] [phone]

## 2014-08-18 NOTE — Telephone Encounter (Signed)
Ok Thx 

## 2014-08-19 ENCOUNTER — Ambulatory Visit (INDEPENDENT_AMBULATORY_CARE_PROVIDER_SITE_OTHER): Payer: Commercial Managed Care - HMO | Admitting: Internal Medicine

## 2014-08-19 ENCOUNTER — Encounter: Payer: Self-pay | Admitting: Internal Medicine

## 2014-08-19 VITALS — BP 170/84 | HR 76 | Temp 98.3°F | Resp 16 | Wt 150.0 lb

## 2014-08-19 DIAGNOSIS — N19 Unspecified kidney failure: Secondary | ICD-10-CM

## 2014-08-19 DIAGNOSIS — I2510 Atherosclerotic heart disease of native coronary artery without angina pectoris: Secondary | ICD-10-CM

## 2014-08-19 DIAGNOSIS — M2550 Pain in unspecified joint: Secondary | ICD-10-CM

## 2014-08-19 DIAGNOSIS — F411 Generalized anxiety disorder: Secondary | ICD-10-CM

## 2014-08-19 DIAGNOSIS — R634 Abnormal weight loss: Secondary | ICD-10-CM

## 2014-08-19 DIAGNOSIS — S7002XD Contusion of left hip, subsequent encounter: Secondary | ICD-10-CM

## 2014-08-19 DIAGNOSIS — I129 Hypertensive chronic kidney disease with stage 1 through stage 4 chronic kidney disease, or unspecified chronic kidney disease: Secondary | ICD-10-CM

## 2014-08-19 DIAGNOSIS — I509 Heart failure, unspecified: Secondary | ICD-10-CM

## 2014-08-19 DIAGNOSIS — M353 Polymyalgia rheumatica: Secondary | ICD-10-CM

## 2014-08-19 DIAGNOSIS — I130 Hypertensive heart and chronic kidney disease with heart failure and stage 1 through stage 4 chronic kidney disease, or unspecified chronic kidney disease: Secondary | ICD-10-CM

## 2014-08-19 DIAGNOSIS — R5382 Chronic fatigue, unspecified: Secondary | ICD-10-CM

## 2014-08-19 DIAGNOSIS — S60222D Contusion of left hand, subsequent encounter: Secondary | ICD-10-CM

## 2014-08-19 DIAGNOSIS — E119 Type 2 diabetes mellitus without complications: Secondary | ICD-10-CM

## 2014-08-19 MED ORDER — CARVEDILOL 25 MG PO TABS: 25.0000 mg | ORAL_TABLET | Freq: Two times a day (BID) | ORAL | Status: DC

## 2014-08-19 MED ORDER — METHYLPREDNISOLONE ACETATE 80 MG/ML IJ SUSP
80.0000 mg | Freq: Once | INTRAMUSCULAR | Status: AC
  Administered 2014-08-19: 80 mg via INTRAMUSCULAR

## 2014-08-19 NOTE — Assessment & Plan Note (Signed)
Better  

## 2014-08-19 NOTE — Assessment & Plan Note (Signed)
Continue with current prescription therapy as reflected on the Med list.  

## 2014-08-19 NOTE — Assessment & Plan Note (Signed)
See Rx 

## 2014-08-19 NOTE — Assessment & Plan Note (Addendum)
Risks associated with treatment noncompliance were discussed. Compliance was encouraged. Re-start Coreg (not taking ?reason)

## 2014-08-19 NOTE — Assessment & Plan Note (Signed)
   Potential benefits of a long term opioids use as well as potential risks (i.e. addiction risk, apnea etc) and complications (i.e. Somnolence, constipation and others) were explained to the patient and were aknowledged. 12/14 Prednisone/Depo-medrol

## 2014-08-19 NOTE — Assessment & Plan Note (Signed)
Related to PMR/OA  Potential benefits of a long term opioids use as well as potential risks (i.e. addiction risk, apnea etc) and complications (i.e. Somnolence, constipation and others) were explained to the patient and were aknowledged. 12/14 Prednisone/Depo-medrol

## 2014-08-19 NOTE — Progress Notes (Signed)
Pre visit review using our clinic review tool, if applicable. No additional management support is needed unless otherwise documented below in the visit note. 

## 2014-08-19 NOTE — Assessment & Plan Note (Signed)
Wt Readings from Last 3 Encounters:  08/19/14 150 lb (68.04 kg)  07/22/14 153 lb 12 oz (69.741 kg)  06/24/14 156 lb (70.761 kg)

## 2014-08-19 NOTE — Assessment & Plan Note (Signed)
Chronic, multifactorial. CFS
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
## 2014-08-23 ENCOUNTER — Telehealth: Payer: Self-pay | Admitting: Internal Medicine

## 2014-08-23 MED ORDER — FUROSEMIDE 20 MG PO TABS: 20.0000 mg | ORAL_TABLET | Freq: Every day | ORAL | Status: DC

## 2014-08-23 MED ORDER — OXYBUTYNIN CHLORIDE 5 MG PO TABS: 5.0000 mg | ORAL_TABLET | Freq: Two times a day (BID) | ORAL | Status: DC

## 2014-08-23 MED ORDER — CARVEDILOL 25 MG PO TABS: 25.0000 mg | ORAL_TABLET | Freq: Two times a day (BID) | ORAL | Status: DC

## 2014-08-23 MED ORDER — METFORMIN HCL 500 MG PO TABS: ORAL_TABLET | ORAL | Status: DC

## 2014-08-23 NOTE — Telephone Encounter (Signed)
Notified pt rx' s sent to [facility]...Raechel Chute

## 2014-08-23 NOTE — Telephone Encounter (Signed)
Patient need prescription Metformin 500 mg, Furosemide 20 mg, Oxybutynin 5 mg,  Carvedilol 25 mg, called into [facility].

## 2014-09-23 ENCOUNTER — Ambulatory Visit (INDEPENDENT_AMBULATORY_CARE_PROVIDER_SITE_OTHER): Payer: Commercial Managed Care - HMO | Admitting: Internal Medicine

## 2014-09-23 ENCOUNTER — Encounter: Payer: Self-pay | Admitting: Internal Medicine

## 2014-09-23 VITALS — BP 138/84 | HR 79 | Temp 97.2°F | Ht 64.0 in | Wt 150.0 lb

## 2014-09-23 DIAGNOSIS — R5382 Chronic fatigue, unspecified: Secondary | ICD-10-CM

## 2014-09-23 DIAGNOSIS — N259 Disorder resulting from impaired renal tubular function, unspecified: Secondary | ICD-10-CM

## 2014-09-23 DIAGNOSIS — M353 Polymyalgia rheumatica: Secondary | ICD-10-CM

## 2014-09-23 DIAGNOSIS — G9332 Myalgic encephalomyelitis/chronic fatigue syndrome: Secondary | ICD-10-CM

## 2014-09-23 DIAGNOSIS — S7002XS Contusion of left hip, sequela: Secondary | ICD-10-CM

## 2014-09-23 DIAGNOSIS — E538 Deficiency of other specified B group vitamins: Secondary | ICD-10-CM

## 2014-09-23 DIAGNOSIS — S60222S Contusion of left hand, sequela: Secondary | ICD-10-CM

## 2014-09-23 MED ORDER — METHYLPREDNISOLONE ACETATE 80 MG/ML IJ SUSP
80.0000 mg | Freq: Once | INTRAMUSCULAR | Status: AC
  Administered 2014-09-23: 80 mg via INTRAMUSCULAR

## 2014-09-23 MED ORDER — CYANOCOBALAMIN 1000 MCG/ML IJ SOLN
1000.0000 ug | Freq: Once | INTRAMUSCULAR | Status: AC
  Administered 2014-09-23: 1000 ug via INTRAMUSCULAR

## 2014-09-23 NOTE — Assessment & Plan Note (Signed)
Long term - multifactorial CFS: treat PMR - see Rx

## 2014-09-23 NOTE — Assessment & Plan Note (Signed)
05/27/14 s/p fall Sx's are 90% better
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
## 2014-09-23 NOTE — Assessment & Plan Note (Signed)
B12 inj q 1 mo 

## 2014-09-23 NOTE — Progress Notes (Signed)
Pre visit review using our clinic review tool, if applicable. No additional management support is needed unless otherwise documented below in the visit note. 

## 2014-09-23 NOTE — Assessment & Plan Note (Signed)
Monitoring labs 

## 2014-09-23 NOTE — Assessment & Plan Note (Signed)
Resolved

## 2014-09-23 NOTE — Assessment & Plan Note (Signed)
Depomedrol shots  make her feel better x 2-3 wks - pt is agreeable to have shots  

## 2014-09-23 NOTE — Addendum Note (Signed)
Addended by: Merrilyn Puma on: 09/23/2014 01:35 PM   Modules accepted: Orders

## 2014-09-24 ENCOUNTER — Telehealth: Payer: Self-pay | Admitting: Internal Medicine

## 2014-09-24 NOTE — Telephone Encounter (Signed)
Patient was in yesterday.  She has lost her bag of meds.  Did not see any in the closet.  Did you guys see anything by chance?

## 2014-09-28 NOTE — Telephone Encounter (Signed)
I called pt- she found her meds at home after calling here.

## 2014-11-02 ENCOUNTER — Ambulatory Visit: Payer: Commercial Managed Care - HMO | Admitting: Internal Medicine

## 2014-11-25 ENCOUNTER — Telehealth: Payer: Self-pay | Admitting: Internal Medicine

## 2014-11-25 MED ORDER — FUROSEMIDE 20 MG PO TABS: 20.0000 mg | ORAL_TABLET | Freq: Every day | ORAL | Status: DC

## 2014-11-25 MED ORDER — OXYBUTYNIN CHLORIDE 5 MG PO TABS: 5.0000 mg | ORAL_TABLET | Freq: Two times a day (BID) | ORAL | Status: DC

## 2014-11-25 NOTE — Telephone Encounter (Signed)
Pt called in need refills for   furosemide (LASIX) 20 MG tablet [[id number]] Oxybutynin 5Mg 

## 2014-11-25 NOTE — Telephone Encounter (Signed)
Tried calling pt home # stated # no longer in service. Sent refills to [facility] ..lmb

## 2014-11-30 ENCOUNTER — Ambulatory Visit: Payer: Commercial Managed Care - HMO | Admitting: Internal Medicine

## 2014-12-07 ENCOUNTER — Encounter: Payer: Self-pay | Admitting: Internal Medicine

## 2014-12-07 ENCOUNTER — Ambulatory Visit (INDEPENDENT_AMBULATORY_CARE_PROVIDER_SITE_OTHER): Payer: Commercial Managed Care - HMO | Admitting: Internal Medicine

## 2014-12-07 ENCOUNTER — Other Ambulatory Visit (INDEPENDENT_AMBULATORY_CARE_PROVIDER_SITE_OTHER): Payer: Commercial Managed Care - HMO

## 2014-12-07 VITALS — BP 162/94 | HR 90 | Temp 98.6°F | Wt 147.5 lb

## 2014-12-07 DIAGNOSIS — E119 Type 2 diabetes mellitus without complications: Secondary | ICD-10-CM | POA: Diagnosis not present

## 2014-12-07 DIAGNOSIS — Z9114 Patient's other noncompliance with medication regimen: Secondary | ICD-10-CM

## 2014-12-07 DIAGNOSIS — E538 Deficiency of other specified B group vitamins: Secondary | ICD-10-CM | POA: Diagnosis not present

## 2014-12-07 DIAGNOSIS — F4323 Adjustment disorder with mixed anxiety and depressed mood: Secondary | ICD-10-CM | POA: Diagnosis not present

## 2014-12-07 DIAGNOSIS — M353 Polymyalgia rheumatica: Secondary | ICD-10-CM | POA: Diagnosis not present

## 2014-12-07 DIAGNOSIS — W1809XS Striking against other object with subsequent fall, sequela: Secondary | ICD-10-CM | POA: Diagnosis not present

## 2014-12-07 DIAGNOSIS — R634 Abnormal weight loss: Secondary | ICD-10-CM

## 2014-12-07 LAB — BASIC METABOLIC PANEL
BUN: 25 mg/dL — ABNORMAL HIGH (ref 6–23)
CALCIUM: 10.1 mg/dL (ref 8.4–10.5)
CHLORIDE: 104 meq/L (ref 96–112)
CO2: 25 mEq/L (ref 19–32)
Creatinine, Ser: 0.95 mg/dL (ref 0.40–1.20)
GFR: 72.86 mL/min (ref >60.00)
Glucose, Bld: 86 mg/dL (ref 70–99)
Potassium: 4.4 mEq/L (ref 3.5–5.1)
Sodium: 137 mEq/L (ref 135–145)

## 2014-12-07 LAB — HEMOGLOBIN A1C: Hgb A1c MFr Bld: 6.4 % (ref 4.6–6.5)

## 2014-12-07 MED ORDER — METHYLPREDNISOLONE ACETATE 80 MG/ML IJ SUSP
80.0000 mg | Freq: Once | INTRAMUSCULAR | Status: AC
  Administered 2014-12-07: 80 mg via INTRAMUSCULAR

## 2014-12-07 MED ORDER — FUROSEMIDE 20 MG PO TABS: 20.0000 mg | ORAL_TABLET | Freq: Every day | ORAL | Status: DC | PRN

## 2014-12-07 MED ORDER — CYANOCOBALAMIN 1000 MCG/ML IJ SOLN
1000.0000 ug | Freq: Once | INTRAMUSCULAR | Status: AC
  Administered 2014-12-07: 1000 ug via INTRAMUSCULAR

## 2014-12-07 NOTE — Assessment & Plan Note (Signed)
Pt declined Rx 

## 2014-12-07 NOTE — Assessment & Plan Note (Signed)
We are watching her wt

## 2014-12-07 NOTE — Assessment & Plan Note (Signed)
Continue with current prescription therapy as reflected on the Med list.  
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
## 2014-12-07 NOTE — Assessment & Plan Note (Signed)
Depo-medrol 80 mg IM 
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
## 2014-12-07 NOTE — Addendum Note (Signed)
Addended by: Merrilyn Puma on: 12/07/2014 05:00 PM   Modules accepted: Orders

## 2014-12-07 NOTE — Assessment & Plan Note (Signed)
Chronic, multifactorial. Risks associated with treatment noncompliance were discussed. Compliance was encouraged.

## 2014-12-24 ENCOUNTER — Telehealth: Payer: Self-pay | Admitting: Internal Medicine
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
## 2014-12-30 NOTE — Telephone Encounter (Signed)
Noted  

## 2014-12-31 ENCOUNTER — Ambulatory Visit (INDEPENDENT_AMBULATORY_CARE_PROVIDER_SITE_OTHER): Payer: Commercial Managed Care - HMO | Admitting: Internal Medicine

## 2014-12-31 ENCOUNTER — Encounter: Payer: Self-pay | Admitting: Internal Medicine

## 2014-12-31 VITALS — BP 170/90 | HR 107 | Wt 145.0 lb

## 2014-12-31 DIAGNOSIS — S7002XS Contusion of left hip, sequela: Secondary | ICD-10-CM

## 2014-12-31 DIAGNOSIS — M353 Polymyalgia rheumatica: Secondary | ICD-10-CM

## 2014-12-31 DIAGNOSIS — E119 Type 2 diabetes mellitus without complications: Secondary | ICD-10-CM

## 2014-12-31 DIAGNOSIS — S60222S Contusion of left hand, sequela: Secondary | ICD-10-CM | POA: Diagnosis not present

## 2014-12-31 MED ORDER — METHYLPREDNISOLONE ACETATE 80 MG/ML IJ SUSP
80.0000 mg | Freq: Once | INTRAMUSCULAR | Status: AC
  Administered 2015-01-03: 80 mg via INTRAMUSCULAR
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
## 2014-12-31 NOTE — Assessment & Plan Note (Signed)
90% better

## 2014-12-31 NOTE — Progress Notes (Signed)
Pre visit review using our clinic review tool, if applicable. No additional management support is needed unless otherwise documented below in the visit note. 

## 2014-12-31 NOTE — Assessment & Plan Note (Signed)
Continue with current prescription therapy as reflected on the Med list.  

## 2014-12-31 NOTE — Assessment & Plan Note (Addendum)
Depomedrol 80 mg IM  Potential benefits of a long term steroid  use as well as potential risks  and complications were explained to the patient and were aknowledged.

## 2015-02-11 ENCOUNTER — Ambulatory Visit: Payer: Commercial Managed Care - HMO | Admitting: Internal Medicine

## 2015-02-15 ENCOUNTER — Ambulatory Visit: Payer: Commercial Managed Care - HMO | Admitting: Internal Medicine

## 2015-02-17 ENCOUNTER — Ambulatory Visit: Payer: Commercial Managed Care - HMO | Admitting: Internal Medicine

## 2015-03-10 ENCOUNTER — Telehealth: Payer: Self-pay | Admitting: Internal Medicine

## 2015-03-10 MED ORDER — FUROSEMIDE 20 MG PO TABS: 20.0000 mg | ORAL_TABLET | Freq: Every day | ORAL | Status: DC | PRN

## 2015-03-10 NOTE — Telephone Encounter (Signed)
Pharmacy is [facility]: patient is waiting for furosemide (LASIX) 20 MG tablet [[id number] through her mail order and is need of a prescription for about 10 days till she gets the other in. She is totally out.

## 2015-03-10 NOTE — Telephone Encounter (Signed)
Sent 30 day to [facility].../lmb 

## 2015-03-18 ENCOUNTER — Inpatient Hospital Stay (HOSPITAL_COMMUNITY)
Admission: EM | Admit: 2015-03-18 | Discharge: 2015-04-20 | DRG: 335 | Disposition: A | Discharge status: Skilled Nursing Facility | Payer: Commercial Managed Care - HMO | Attending: Internal Medicine | Admitting: Internal Medicine

## 2015-03-18 ENCOUNTER — Inpatient Hospital Stay (HOSPITAL_COMMUNITY): Payer: Commercial Managed Care - HMO

## 2015-03-18 ENCOUNTER — Emergency Department (HOSPITAL_COMMUNITY): Payer: Commercial Managed Care - HMO

## 2015-03-18 ENCOUNTER — Encounter (HOSPITAL_COMMUNITY): Payer: Self-pay | Admitting: Oncology

## 2015-03-18 DIAGNOSIS — Y838 Other surgical procedures as the cause of abnormal reaction of the patient, or of later complication, without mention of misadventure at the time of the procedure: Secondary | ICD-10-CM | POA: Diagnosis not present

## 2015-03-18 DIAGNOSIS — M109 Gout, unspecified: Secondary | ICD-10-CM | POA: Diagnosis present

## 2015-03-18 DIAGNOSIS — E669 Obesity, unspecified: Secondary | ICD-10-CM | POA: Diagnosis present

## 2015-03-18 DIAGNOSIS — J9811 Atelectasis: Secondary | ICD-10-CM | POA: Diagnosis not present

## 2015-03-18 DIAGNOSIS — E1121 Type 2 diabetes mellitus with diabetic nephropathy: Secondary | ICD-10-CM

## 2015-03-18 DIAGNOSIS — Z888 Allergy status to other drugs, medicaments and biological substances status: Secondary | ICD-10-CM | POA: Diagnosis not present

## 2015-03-18 DIAGNOSIS — Z7982 Long term (current) use of aspirin: Secondary | ICD-10-CM | POA: Diagnosis not present

## 2015-03-18 DIAGNOSIS — Z833 Family history of diabetes mellitus: Secondary | ICD-10-CM | POA: Diagnosis not present

## 2015-03-18 DIAGNOSIS — Z4682 Encounter for fitting and adjustment of non-vascular catheter: Secondary | ICD-10-CM | POA: Diagnosis not present

## 2015-03-18 DIAGNOSIS — K6811 Postprocedural retroperitoneal abscess: Secondary | ICD-10-CM | POA: Diagnosis not present

## 2015-03-18 DIAGNOSIS — I2510 Atherosclerotic heart disease of native coronary artery without angina pectoris: Secondary | ICD-10-CM | POA: Diagnosis not present

## 2015-03-18 DIAGNOSIS — Z9049 Acquired absence of other specified parts of digestive tract: Secondary | ICD-10-CM | POA: Diagnosis present

## 2015-03-18 DIAGNOSIS — R6520 Severe sepsis without septic shock: Secondary | ICD-10-CM | POA: Diagnosis not present

## 2015-03-18 DIAGNOSIS — Z79899 Other long term (current) drug therapy: Secondary | ICD-10-CM | POA: Diagnosis not present

## 2015-03-18 DIAGNOSIS — E440 Moderate protein-calorie malnutrition: Secondary | ICD-10-CM | POA: Diagnosis not present

## 2015-03-18 DIAGNOSIS — Z885 Allergy status to narcotic agent status: Secondary | ICD-10-CM

## 2015-03-18 DIAGNOSIS — A419 Sepsis, unspecified organism: Secondary | ICD-10-CM

## 2015-03-18 DIAGNOSIS — I10 Essential (primary) hypertension: Secondary | ICD-10-CM | POA: Diagnosis present

## 2015-03-18 DIAGNOSIS — R030 Elevated blood-pressure reading, without diagnosis of hypertension: Secondary | ICD-10-CM | POA: Diagnosis not present

## 2015-03-18 DIAGNOSIS — D509 Iron deficiency anemia, unspecified: Secondary | ICD-10-CM | POA: Diagnosis present

## 2015-03-18 DIAGNOSIS — B9689 Other specified bacterial agents as the cause of diseases classified elsewhere: Secondary | ICD-10-CM | POA: Diagnosis not present

## 2015-03-18 DIAGNOSIS — R112 Nausea with vomiting, unspecified: Secondary | ICD-10-CM | POA: Diagnosis not present

## 2015-03-18 DIAGNOSIS — Z8249 Family history of ischemic heart disease and other diseases of the circulatory system: Secondary | ICD-10-CM | POA: Diagnosis not present

## 2015-03-18 DIAGNOSIS — Z09 Encounter for follow-up examination after completed treatment for conditions other than malignant neoplasm: Secondary | ICD-10-CM

## 2015-03-18 DIAGNOSIS — M19071 Primary osteoarthritis, right ankle and foot: Secondary | ICD-10-CM | POA: Diagnosis not present

## 2015-03-18 DIAGNOSIS — Z95828 Presence of other vascular implants and grafts: Secondary | ICD-10-CM

## 2015-03-18 DIAGNOSIS — N179 Acute kidney failure, unspecified: Secondary | ICD-10-CM | POA: Diagnosis not present

## 2015-03-18 DIAGNOSIS — K565 Intestinal adhesions [bands], unspecified as to partial versus complete obstruction: Secondary | ICD-10-CM

## 2015-03-18 DIAGNOSIS — E559 Vitamin D deficiency, unspecified: Secondary | ICD-10-CM | POA: Diagnosis present

## 2015-03-18 DIAGNOSIS — K219 Gastro-esophageal reflux disease without esophagitis: Secondary | ICD-10-CM | POA: Diagnosis present

## 2015-03-18 DIAGNOSIS — D62 Acute posthemorrhagic anemia: Secondary | ICD-10-CM | POA: Diagnosis not present

## 2015-03-18 DIAGNOSIS — R109 Unspecified abdominal pain: Secondary | ICD-10-CM | POA: Diagnosis not present

## 2015-03-18 DIAGNOSIS — E871 Hypo-osmolality and hyponatremia: Secondary | ICD-10-CM | POA: Diagnosis not present

## 2015-03-18 DIAGNOSIS — R410 Disorientation, unspecified: Secondary | ICD-10-CM | POA: Diagnosis present

## 2015-03-18 DIAGNOSIS — M353 Polymyalgia rheumatica: Secondary | ICD-10-CM | POA: Diagnosis present

## 2015-03-18 DIAGNOSIS — E785 Hyperlipidemia, unspecified: Secondary | ICD-10-CM | POA: Diagnosis present

## 2015-03-18 DIAGNOSIS — F329 Major depressive disorder, single episode, unspecified: Secondary | ICD-10-CM | POA: Diagnosis not present

## 2015-03-18 DIAGNOSIS — K5660 Unspecified intestinal obstruction: Secondary | ICD-10-CM | POA: Diagnosis not present

## 2015-03-18 DIAGNOSIS — D72829 Elevated white blood cell count, unspecified: Secondary | ICD-10-CM | POA: Diagnosis not present

## 2015-03-18 DIAGNOSIS — Y92239 Unspecified place in hospital as the place of occurrence of the external cause: Secondary | ICD-10-CM

## 2015-03-18 DIAGNOSIS — R918 Other nonspecific abnormal finding of lung field: Secondary | ICD-10-CM | POA: Diagnosis not present

## 2015-03-18 DIAGNOSIS — E1122 Type 2 diabetes mellitus with diabetic chronic kidney disease: Secondary | ICD-10-CM | POA: Diagnosis present

## 2015-03-18 DIAGNOSIS — Z0189 Encounter for other specified special examinations: Secondary | ICD-10-CM

## 2015-03-18 DIAGNOSIS — Z9114 Patient's other noncompliance with medication regimen: Secondary | ICD-10-CM | POA: Diagnosis present

## 2015-03-18 DIAGNOSIS — K913 Postprocedural intestinal obstruction: Secondary | ICD-10-CM | POA: Diagnosis present

## 2015-03-18 DIAGNOSIS — K659 Peritonitis, unspecified: Secondary | ICD-10-CM | POA: Diagnosis not present

## 2015-03-18 DIAGNOSIS — I639 Cerebral infarction, unspecified: Secondary | ICD-10-CM | POA: Diagnosis not present

## 2015-03-18 DIAGNOSIS — B952 Enterococcus as the cause of diseases classified elsewhere: Secondary | ICD-10-CM | POA: Diagnosis not present

## 2015-03-18 DIAGNOSIS — K5669 Other intestinal obstruction: Secondary | ICD-10-CM

## 2015-03-18 DIAGNOSIS — R29810 Facial weakness: Secondary | ICD-10-CM

## 2015-03-18 DIAGNOSIS — L8990 Pressure ulcer of unspecified site, unspecified stage: Secondary | ICD-10-CM | POA: Diagnosis not present

## 2015-03-18 DIAGNOSIS — M6281 Muscle weakness (generalized): Secondary | ICD-10-CM | POA: Diagnosis not present

## 2015-03-18 DIAGNOSIS — J80 Acute respiratory distress syndrome: Secondary | ICD-10-CM | POA: Diagnosis not present

## 2015-03-18 DIAGNOSIS — I712 Thoracic aortic aneurysm, without rupture: Secondary | ICD-10-CM | POA: Diagnosis present

## 2015-03-18 DIAGNOSIS — Z886 Allergy status to analgesic agent status: Secondary | ICD-10-CM | POA: Diagnosis not present

## 2015-03-18 DIAGNOSIS — R627 Adult failure to thrive: Secondary | ICD-10-CM | POA: Diagnosis not present

## 2015-03-18 DIAGNOSIS — K56609 Unspecified intestinal obstruction, unspecified as to partial versus complete obstruction: Secondary | ICD-10-CM | POA: Diagnosis present

## 2015-03-18 DIAGNOSIS — M25561 Pain in right knee: Secondary | ICD-10-CM | POA: Diagnosis present

## 2015-03-18 DIAGNOSIS — M25562 Pain in left knee: Secondary | ICD-10-CM | POA: Diagnosis present

## 2015-03-18 DIAGNOSIS — L0291 Cutaneous abscess, unspecified: Secondary | ICD-10-CM

## 2015-03-18 DIAGNOSIS — R1013 Epigastric pain: Secondary | ICD-10-CM | POA: Diagnosis not present

## 2015-03-18 DIAGNOSIS — G8321 Monoplegia of upper limb affecting right dominant side: Secondary | ICD-10-CM | POA: Diagnosis not present

## 2015-03-18 DIAGNOSIS — Z880 Allergy status to penicillin: Secondary | ICD-10-CM | POA: Diagnosis not present

## 2015-03-18 DIAGNOSIS — R9431 Abnormal electrocardiogram [ECG] [EKG]: Secondary | ICD-10-CM | POA: Diagnosis not present

## 2015-03-18 DIAGNOSIS — E875 Hyperkalemia: Secondary | ICD-10-CM | POA: Diagnosis not present

## 2015-03-18 DIAGNOSIS — I6789 Other cerebrovascular disease: Secondary | ICD-10-CM | POA: Diagnosis not present

## 2015-03-18 DIAGNOSIS — N182 Chronic kidney disease, stage 2 (mild): Secondary | ICD-10-CM | POA: Diagnosis present

## 2015-03-18 DIAGNOSIS — M810 Age-related osteoporosis without current pathological fracture: Secondary | ICD-10-CM | POA: Diagnosis present

## 2015-03-18 DIAGNOSIS — E872 Acidosis: Secondary | ICD-10-CM | POA: Diagnosis not present

## 2015-03-18 DIAGNOSIS — R1084 Generalized abdominal pain: Secondary | ICD-10-CM

## 2015-03-18 DIAGNOSIS — F4323 Adjustment disorder with mixed anxiety and depressed mood: Secondary | ICD-10-CM | POA: Diagnosis not present

## 2015-03-18 DIAGNOSIS — R4182 Altered mental status, unspecified: Secondary | ICD-10-CM | POA: Diagnosis not present

## 2015-03-18 DIAGNOSIS — M79671 Pain in right foot: Secondary | ICD-10-CM | POA: Diagnosis not present

## 2015-03-18 DIAGNOSIS — Z931 Gastrostomy status: Secondary | ICD-10-CM | POA: Diagnosis not present

## 2015-03-18 DIAGNOSIS — D6959 Other secondary thrombocytopenia: Secondary | ICD-10-CM | POA: Diagnosis not present

## 2015-03-18 DIAGNOSIS — L02211 Cutaneous abscess of abdominal wall: Secondary | ICD-10-CM | POA: Diagnosis not present

## 2015-03-18 DIAGNOSIS — A4159 Other Gram-negative sepsis: Secondary | ICD-10-CM | POA: Diagnosis present

## 2015-03-18 DIAGNOSIS — K5900 Constipation, unspecified: Secondary | ICD-10-CM | POA: Diagnosis not present

## 2015-03-18 DIAGNOSIS — R509 Fever, unspecified: Secondary | ICD-10-CM | POA: Insufficient documentation

## 2015-03-18 DIAGNOSIS — T814XXA Infection following a procedure, initial encounter: Secondary | ICD-10-CM | POA: Diagnosis not present

## 2015-03-18 DIAGNOSIS — R1310 Dysphagia, unspecified: Secondary | ICD-10-CM | POA: Diagnosis not present

## 2015-03-18 DIAGNOSIS — R5382 Chronic fatigue, unspecified: Secondary | ICD-10-CM | POA: Diagnosis present

## 2015-03-18 DIAGNOSIS — Z5189 Encounter for other specified aftercare: Secondary | ICD-10-CM | POA: Diagnosis not present

## 2015-03-18 DIAGNOSIS — Z86718 Personal history of other venous thrombosis and embolism: Secondary | ICD-10-CM

## 2015-03-18 DIAGNOSIS — Z91048 Other nonmedicinal substance allergy status: Secondary | ICD-10-CM

## 2015-03-18 DIAGNOSIS — G9340 Encephalopathy, unspecified: Secondary | ICD-10-CM | POA: Diagnosis not present

## 2015-03-18 DIAGNOSIS — R52 Pain, unspecified: Secondary | ICD-10-CM

## 2015-03-18 DIAGNOSIS — I700 Atherosclerosis of aorta: Secondary | ICD-10-CM | POA: Diagnosis not present

## 2015-03-18 DIAGNOSIS — E861 Hypovolemia: Secondary | ICD-10-CM | POA: Diagnosis not present

## 2015-03-18 DIAGNOSIS — Z431 Encounter for attention to gastrostomy: Secondary | ICD-10-CM | POA: Diagnosis not present

## 2015-03-18 DIAGNOSIS — E46 Unspecified protein-calorie malnutrition: Secondary | ICD-10-CM | POA: Diagnosis not present

## 2015-03-18 DIAGNOSIS — E43 Unspecified severe protein-calorie malnutrition: Secondary | ICD-10-CM | POA: Diagnosis not present

## 2015-03-18 DIAGNOSIS — G319 Degenerative disease of nervous system, unspecified: Secondary | ICD-10-CM | POA: Diagnosis not present

## 2015-03-18 DIAGNOSIS — Z90710 Acquired absence of both cervix and uterus: Secondary | ICD-10-CM

## 2015-03-18 DIAGNOSIS — F411 Generalized anxiety disorder: Secondary | ICD-10-CM | POA: Diagnosis present

## 2015-03-18 DIAGNOSIS — E876 Hypokalemia: Secondary | ICD-10-CM | POA: Diagnosis not present

## 2015-03-18 DIAGNOSIS — G9332 Myalgic encephalomyelitis/chronic fatigue syndrome: Secondary | ICD-10-CM | POA: Diagnosis present

## 2015-03-18 DIAGNOSIS — R278 Other lack of coordination: Secondary | ICD-10-CM | POA: Diagnosis not present

## 2015-03-18 DIAGNOSIS — Z01810 Encounter for preprocedural cardiovascular examination: Secondary | ICD-10-CM | POA: Diagnosis not present

## 2015-03-18 DIAGNOSIS — E538 Deficiency of other specified B group vitamins: Secondary | ICD-10-CM | POA: Diagnosis present

## 2015-03-18 DIAGNOSIS — Z6824 Body mass index (BMI) 24.0-24.9, adult: Secondary | ICD-10-CM

## 2015-03-18 DIAGNOSIS — I129 Hypertensive chronic kidney disease with stage 1 through stage 4 chronic kidney disease, or unspecified chronic kidney disease: Secondary | ICD-10-CM | POA: Diagnosis present

## 2015-03-18 DIAGNOSIS — E119 Type 2 diabetes mellitus without complications: Secondary | ICD-10-CM | POA: Diagnosis not present

## 2015-03-18 DIAGNOSIS — F419 Anxiety disorder, unspecified: Secondary | ICD-10-CM | POA: Diagnosis present

## 2015-03-18 DIAGNOSIS — K598 Other specified functional intestinal disorders: Secondary | ICD-10-CM | POA: Diagnosis not present

## 2015-03-18 DIAGNOSIS — Z955 Presence of coronary angioplasty implant and graft: Secondary | ICD-10-CM

## 2015-03-18 DIAGNOSIS — F05 Delirium due to known physiological condition: Secondary | ICD-10-CM | POA: Diagnosis not present

## 2015-03-18 DIAGNOSIS — R1011 Right upper quadrant pain: Secondary | ICD-10-CM | POA: Diagnosis not present

## 2015-03-18 DIAGNOSIS — M549 Dorsalgia, unspecified: Secondary | ICD-10-CM | POA: Diagnosis present

## 2015-03-18 DIAGNOSIS — Z9889 Other specified postprocedural states: Secondary | ICD-10-CM | POA: Diagnosis not present

## 2015-03-18 DIAGNOSIS — Z452 Encounter for adjustment and management of vascular access device: Secondary | ICD-10-CM | POA: Diagnosis not present

## 2015-03-18 DIAGNOSIS — K5730 Diverticulosis of large intestine without perforation or abscess without bleeding: Secondary | ICD-10-CM | POA: Diagnosis not present

## 2015-03-18 DIAGNOSIS — M130 Polyarthritis, unspecified: Secondary | ICD-10-CM | POA: Diagnosis present

## 2015-03-18 DIAGNOSIS — R609 Edema, unspecified: Secondary | ICD-10-CM | POA: Diagnosis not present

## 2015-03-18 DIAGNOSIS — R1312 Dysphagia, oropharyngeal phase: Secondary | ICD-10-CM | POA: Diagnosis not present

## 2015-03-18 DIAGNOSIS — G8929 Other chronic pain: Secondary | ICD-10-CM | POA: Diagnosis present

## 2015-03-18 DIAGNOSIS — Z789 Other specified health status: Secondary | ICD-10-CM

## 2015-03-18 DIAGNOSIS — K651 Peritoneal abscess: Secondary | ICD-10-CM | POA: Diagnosis not present

## 2015-03-18 DIAGNOSIS — IMO0002 Reserved for concepts with insufficient information to code with codable children: Secondary | ICD-10-CM | POA: Diagnosis present

## 2015-03-18 LAB — URINALYSIS, ROUTINE W REFLEX MICROSCOPIC
Bilirubin Urine: NEGATIVE
Glucose, UA: NEGATIVE mg/dL
HGB URINE DIPSTICK: NEGATIVE
KETONES UR: NEGATIVE mg/dL
LEUKOCYTES UA: NEGATIVE
NITRITE: NEGATIVE
Protein, ur: 30 mg/dL — AB
Specific Gravity, Urine: 1.019 (ref 1.005–1.030)
Urobilinogen, UA: 0.2 mg/dL (ref 0.0–1.0)
pH: 7.5 (ref 5.0–8.0)

## 2015-03-18 LAB — COMPREHENSIVE METABOLIC PANEL
ALBUMIN: 4.7 g/dL (ref 3.5–5.0)
ALK PHOS: 69 U/L (ref 38–126)
ALT: 13 U/L — AB (ref 14–54)
ANION GAP: 10 (ref 5–15)
AST: 26 U/L (ref 15–41)
BUN: 20 mg/dL (ref 6–20)
CO2: 24 mmol/L (ref 22–32)
Calcium: 10.1 mg/dL (ref 8.9–10.3)
Chloride: 101 mmol/L (ref 101–111)
Creatinine, Ser: 1.06 mg/dL — ABNORMAL HIGH (ref 0.44–1.00)
GFR calc Af Amer: 56 mL/min — ABNORMAL LOW (ref >60)
GFR calc non Af Amer: 49 mL/min — ABNORMAL LOW (ref >60)
Glucose, Bld: 138 mg/dL — ABNORMAL HIGH (ref 70–99)
POTASSIUM: 4.1 mmol/L (ref 3.5–5.1)
SODIUM: 135 mmol/L (ref 135–145)
Total Bilirubin: 0.7 mg/dL (ref 0.3–1.2)
Total Protein: 9.3 g/dL — ABNORMAL HIGH (ref 6.5–8.1)

## 2015-03-18 LAB — CBC WITH DIFFERENTIAL/PLATELET
Basophils Absolute: 0 10*3/uL (ref 0.0–0.1)
Basophils Relative: 0 % (ref 0–1)
EOS ABS: 0 10*3/uL (ref 0.0–0.7)
EOS PCT: 1 % (ref 0–5)
HCT: 36 % (ref 36.0–46.0)
Hemoglobin: 11.7 g/dL — ABNORMAL LOW (ref 12.0–15.0)
LYMPHS PCT: 14 % (ref 12–46)
Lymphs Abs: 0.9 10*3/uL (ref 0.7–4.0)
MCH: 30.1 pg (ref 26.0–34.0)
MCHC: 32.5 g/dL (ref 30.0–36.0)
MCV: 92.5 fL (ref 78.0–100.0)
MONO ABS: 0.4 10*3/uL (ref 0.1–1.0)
Monocytes Relative: 6 % (ref 3–12)
NEUTROS ABS: 5.3 10*3/uL (ref 1.7–7.7)
NEUTROS PCT: 79 % — AB (ref 43–77)
PLATELETS: 352 10*3/uL (ref 150–400)
RBC: 3.89 MIL/uL (ref 3.87–5.11)
RDW: 12.7 % (ref 11.5–15.5)
WBC: 6.7 10*3/uL (ref 4.0–10.5)

## 2015-03-18 LAB — GLUCOSE, CAPILLARY
Glucose-Capillary: 185 mg/dL — ABNORMAL HIGH (ref 70–99)
Glucose-Capillary: 144 mg/dL — ABNORMAL HIGH (ref 70–99)

## 2015-03-18 LAB — URINE MICROSCOPIC-ADD ON

## 2015-03-18 LAB — LIPASE, BLOOD: LIPASE: 30 U/L (ref 22–51)

## 2015-03-18 MED ORDER — ACETAMINOPHEN 650 MG RE SUPP: 650.0000 mg | Freq: Four times a day (QID) | RECTAL | Status: DC | PRN

## 2015-03-18 MED ORDER — MORPHINE SULFATE 2 MG/ML IJ SOLN
INTRAMUSCULAR | Status: AC
  Administered 2015-03-18: 2 mg via INTRAVENOUS
  Filled 2015-03-18: qty 1

## 2015-03-18 MED ORDER — INSULIN ASPART 100 UNIT/ML ~~LOC~~ SOLN
0.0000 [IU] | SUBCUTANEOUS | Status: DC
  Administered 2015-03-18 – 2015-03-22 (×9): 1 [IU] via SUBCUTANEOUS
  Administered 2015-03-23 (×2): 2 [IU] via SUBCUTANEOUS
  Administered 2015-03-23 – 2015-03-24 (×3): 1 [IU] via SUBCUTANEOUS

## 2015-03-18 MED ORDER — FENTANYL CITRATE (PF) 100 MCG/2ML IJ SOLN
50.0000 ug | Freq: Once | INTRAMUSCULAR | Status: AC
  Administered 2015-03-18: 50 ug via INTRAVENOUS
  Filled 2015-03-18: qty 2

## 2015-03-18 MED ORDER — DIATRIZOATE MEGLUMINE & SODIUM 66-10 % PO SOLN
90.0000 mL | Freq: Once | ORAL | Status: AC
  Administered 2015-03-18: 90 mL via NASOGASTRIC

## 2015-03-18 MED ORDER — SODIUM CHLORIDE 0.9 % IV SOLN: INTRAVENOUS | Status: DC

## 2015-03-18 MED ORDER — ONDANSETRON HCL 4 MG/2ML IJ SOLN
4.0000 mg | Freq: Once | INTRAMUSCULAR | Status: AC
  Administered 2015-03-18: 4 mg via INTRAVENOUS
  Filled 2015-03-18: qty 2

## 2015-03-18 MED ORDER — IOHEXOL 300 MG/ML  SOLN
50.0000 mL | Freq: Once | INTRAMUSCULAR | Status: AC | PRN
  Administered 2015-03-18: 50 mL via ORAL

## 2015-03-18 MED ORDER — SODIUM CHLORIDE 0.9 % IV SOLN
1000.0000 mL | Freq: Once | INTRAVENOUS | Status: AC
  Administered 2015-03-18: 1000 mL via INTRAVENOUS

## 2015-03-18 MED ORDER — MORPHINE SULFATE 2 MG/ML IJ SOLN
2.0000 mg | INTRAMUSCULAR | Status: DC | PRN
  Administered 2015-03-18 – 2015-03-19 (×2): 2 mg via INTRAVENOUS
  Filled 2015-03-18 (×4): qty 1

## 2015-03-18 MED ORDER — POLYVINYL ALCOHOL 1.4 % OP SOLN
1.0000 [drp] | Freq: Every day | OPHTHALMIC | Status: DC | PRN
  Filled 2015-03-18: qty 15

## 2015-03-18 MED ORDER — HYDRALAZINE HCL 20 MG/ML IJ SOLN
10.0000 mg | Freq: Four times a day (QID) | INTRAMUSCULAR | Status: DC | PRN
  Administered 2015-03-22: 10 mg via INTRAVENOUS
  Filled 2015-03-18: qty 1

## 2015-03-18 MED ORDER — LORAZEPAM 2 MG/ML IJ SOLN
1.0000 mg | Freq: Three times a day (TID) | INTRAMUSCULAR | Status: DC | PRN
  Administered 2015-03-20 – 2015-03-21 (×3): 1 mg via INTRAVENOUS
  Filled 2015-03-18 (×4): qty 1

## 2015-03-18 MED ORDER — ENOXAPARIN SODIUM 40 MG/0.4ML ~~LOC~~ SOLN
40.0000 mg | SUBCUTANEOUS | Status: DC
  Administered 2015-03-18 – 2015-03-22 (×5): 40 mg via SUBCUTANEOUS
  Filled 2015-03-18 (×8): qty 0.4

## 2015-03-18 MED ORDER — IOHEXOL 300 MG/ML  SOLN
100.0000 mL | Freq: Once | INTRAMUSCULAR | Status: AC | PRN
  Administered 2015-03-18: 100 mL via INTRAVENOUS

## 2015-03-18 MED ORDER — ACETAMINOPHEN 325 MG PO TABS: 650.0000 mg | ORAL_TABLET | Freq: Four times a day (QID) | ORAL | Status: DC | PRN

## 2015-03-18 MED ORDER — ONDANSETRON HCL 4 MG/2ML IJ SOLN
4.0000 mg | INTRAMUSCULAR | Status: DC | PRN
  Administered 2015-03-18 – 2015-03-23 (×4): 4 mg via INTRAVENOUS
  Filled 2015-03-18 (×5): qty 2

## 2015-03-18 MED ORDER — ONDANSETRON HCL 4 MG/2ML IJ SOLN: 4.0000 mg | Freq: Once | INTRAMUSCULAR | Status: DC

## 2015-03-18 MED ORDER — POLYETHYL GLYCOL-PROPYL GLYCOL 0.4-0.3 % OP SOLN: 1.0000 [drp] | Freq: Every day | OPHTHALMIC | Status: DC | PRN

## 2015-03-18 MED ORDER — FENTANYL CITRATE (PF) 100 MCG/2ML IJ SOLN
50.0000 ug | INTRAMUSCULAR | Status: DC | PRN
  Administered 2015-03-18 – 2015-03-19 (×7): 50 ug via INTRAVENOUS
  Filled 2015-03-18 (×7): qty 2

## 2015-03-18 MED ORDER — LIDOCAINE HCL 2 % EX GEL
1.0000 "application " | Freq: Once | CUTANEOUS | Status: AC
  Administered 2015-03-18: 1 via TOPICAL
  Filled 2015-03-18: qty 10

## 2015-03-18 MED ORDER — ONDANSETRON HCL 4 MG PO TABS
4.0000 mg | ORAL_TABLET | Freq: Four times a day (QID) | ORAL | Status: DC | PRN
  Administered 2015-03-23: 4 mg via ORAL

## 2015-03-18 MED ORDER — SODIUM CHLORIDE 0.9 % IV SOLN
INTRAVENOUS | Status: DC
  Administered 2015-03-18: 75 mL/h via INTRAVENOUS
  Administered 2015-03-19: 16:00:00 via INTRAVENOUS

## 2015-03-18 NOTE — H&P (Addendum)
[facility] History and Physical  [name] KGM:[id number] DOB: Dec 31, 1932 DOA: 03/18/2015  Referring physician: Gerhard Munch PCP: [name], MD   Chief Complaint:  Nausea x 4 days Abdominal pain since one day  HPI:  81 year old female with history of hypertension, coronary artery disease, hyperlipidemia, iron deficiency anemia, depression, osteoporosis,  mild chronic kidney disease presented to the ED with ongoing nausea for the past 4 days. Since yesterday since started having diffuse crampy lower abdominal pain without any aggravating or relieving factors. She reports gagging herself this morning. She reports poor by mouth intake for the past 4 days and generalized weakness. Denies similar history in the past. He reports having chills early this morning. Patient denies headache, dizziness, fever, , chest pain, palpitations, SOB, bowel or urinary symptoms. Denies change in weight .  Course in the ED Patient's blood pressure was elevated 199/95 mmHg. Blood will done showed mild anemia. Chemistry was unremarkable. CT scan of the abdomen and pelvis done showed small bowel obstruction with moderate stool burden. Hospitalists admission requested to medical floor. [place] surgery consulted. NG tube was placed in the ED to wall suction.  Review of Systems:  Constitutional: Denies fever, chills, diaphoresis, appetite change and fatigue.  HEENT: Denies visual or hearing symptoms, congestion, sore throat, trouble swallowing, neck pain or stiffness  Respiratory: Denies SOB, DOE, cough, chest tightness,  and wheezing.   Cardiovascular: Denies chest pain, palpitations and leg swelling.  Gastrointestinal: nausea, vomiting, abdominal pain, denies diarrhea, constipation, blood in stool and abdominal distention.  Genitourinary: Denies dysuria,  hematuria, flank pain and difficulty urinating.  Endocrine: Denies: hot or cold intolerance,  polyuria, polydipsia. Musculoskeletal: Chronic  low back pain and bilateral knee pain. Skin: Denies  rash and wound.  Neurological: Denies dizziness, , syncope, weakness, light-headedness, numbness and headaches.  Hematological: Denies adenopathy.  Psychiatric/Behavioral: Denies confusion  Past Medical History  Diagnosis Date  . CAD (coronary artery disease)     s/p stenting of LAD 1999- cath 5-08 EF normal LAD 30-40% restenosis. D1 50% D2 80% LCX & RCA minimal plaque  . HTN (hypertension)   . Hyperlipemia   . Anemia     iron deficiency  . Depression   . DVT (deep venous thrombosis)   . Gout   . Osteoporosis   . Pancreatitis   . GERD (gastroesophageal reflux disease)   . Renal insufficiency     Cr 1.2-1.3  . Obesity   . Diabetes mellitus   . Polyarthritis     DJD/ possible PMR  . Vitamin D deficiency   . B12 deficiency   . Tinnitus   . Anxiety   . Aneurysm, thoracic aortic   . Constipation   . Urinary frequency   . Vertigo   . Chronic back pain    Past Surgical History  Procedure Laterality Date  . Hemorrhoid surgery    . Abdominal hysterectomy    . Cholecystectomy    . Tubal ligation     Social History:  reports that she has never smoked. She does not have any smokeless tobacco history on file. She reports that she does not drink alcohol or use illicit drugs.  Allergies  Allergen Reactions  . Amlodipine Besylate     REACTION: dizzy  . Aspirin   . Atenolol     REACTION: fatigue  . Benazepril     cough  . Benicar [Olmesartan Medoxomil]     HA  . Cozaar     nausea  . Hydrochlorothiazide W-Triamterene  REACTION: dizzy  . Hydrocodone     REACTION: HA  . Hydroxyzine Pamoate   . Iodine   . Lisinopril     REACTION: tired, cough  . Penicillins   . Pravastatin     myalgias  . Prednisolone     My stomach hurts  . Tramadol Hcl     REACTION: HA    Family History  Problem Relation Age of Onset  . Adopted: Yes  . Diabetes Mother   . Hypertension Father     Prior to Admission medications    Medication Sig Start Date End Date Taking? Authorizing Provider  acetaminophen (TYLENOL) 650 MG CR tablet Take 1,300 mg by mouth every 8 (eight) hours as needed for pain.    Yes Historical Provider, MD  aspirin 325 MG tablet Take 325 mg by mouth daily.   Yes Historical Provider, MD  cholecalciferol (VITAMIN D) 1000 UNITS tablet Take 2 tablets (2,000 Units total) by mouth daily. 04/17/13  Yes Aleksei Plotnikov V, MD  Cyanocobalamin (VITAMIN B-12) 1000 MCG SUBL Place 1 tablet (1,000 mcg total) under the tongue daily. 12/03/13  Yes Aleksei Plotnikov V, MD  furosemide (LASIX) 20 MG tablet Take 1 tablet (20 mg total) by mouth daily as needed for edema. Patient taking differently: Take 20 mg by mouth daily.  03/10/15 03/09/16 Yes Aleksei Plotnikov V, MD  metFORMIN (GLUCOPHAGE) 500 MG tablet TAKE 1 TABLET TWICE DAILY  WITH  A  MEAL 08/23/14  Yes Aleksei Plotnikov V, MD  OVER THE COUNTER MEDICATION Apply 1 application topically 2 (two) times daily as needed (dryness). Diabetic Lotion.   Yes Historical Provider, MD  oxybutynin (DITROPAN) 5 MG tablet Take 1 tablet (5 mg total) by mouth 2 (two) times daily. 11/25/14  Yes Aleksei Plotnikov V, MD  Polyethyl Glycol-Propyl Glycol (SYSTANE OP) Apply 1-2 drops to eye daily as needed (dry eyes).   Yes Historical Provider, MD  LORazepam (ATIVAN) 1 MG tablet Take 0.5 tablets (0.5 mg total) by mouth every 8 (eight) hours as needed for anxiety. Patient not taking: Reported on 03/18/2015 05/07/13   Bethann Berkshire, MD     Physical Exam:  Filed Vitals:   03/18/15 0700 03/18/15 0845 03/18/15 0900 03/18/15 1000  BP: 157/97 188/73 187/92 199/95  Pulse: 88 86 89 89  Temp: 99 F (37.2 C)     TempSrc: Oral     Resp: 13 12 15 13   Height: 5\' 4"  (1.626 m)     Weight: 63.957 kg (141 lb)     SpO2: 100% 100% 100% 98%    Constitutional: Vital signs reviewed.  Early female in no acute distress. Appears fatigued HEENT: no pallor, no icterus, dry oral mucosa , no cervical  lymphadenopathy, supple neck Cardiovascular: RRR, S1 normal, S2 normal, no MRG Chest: CTAB, no wheezes, rales, or rhonchi Abdominal: Soft. Nondistended, sluggish bowel sounds, diffuse lower abdominal tenderness, no guarding or rigidity, lower midline and right subcostal surgical scar (of hysterectomy and open cholecystectomy respectively) Ext: warm, no edema Neurological: Alert and oriented, nonfocal  Labs on Admission:  Basic Metabolic Panel:  Recent Labs Lab 03/18/15 0714  NA 135  K 4.1  CL 101  CO2 24  GLUCOSE 138*  BUN 20  CREATININE 1.06*  CALCIUM 10.1   Liver Function Tests:  Recent Labs Lab 03/18/15 0714  AST 26  ALT 13*  ALKPHOS 69  BILITOT 0.7  PROT 9.3*  ALBUMIN 4.7    Recent Labs Lab 03/18/15 0714  LIPASE 30  No results for input(s): AMMONIA in the last 168 hours. CBC:  Recent Labs Lab 03/18/15 0714  WBC 6.7  NEUTROABS 5.3  HGB 11.7*  HCT 36.0  MCV 92.5  PLT 352   Cardiac Enzymes: No results for input(s): CKTOTAL, CKMB, CKMBINDEX, TROPONINI in the last 168 hours. BNP: Invalid input(s): POCBNP CBG: No results for input(s): GLUCAP in the last 168 hours.  Radiological Exams on Admission: Ct Abdomen Pelvis W Contrast  03/18/2015   CLINICAL DATA:  Nausea and vomiting for 4 days, anxiety, status post hysterectomy CT pelvis 05/27/2014  EXAM: CT ABDOMEN AND PELVIS WITH CONTRAST  TECHNIQUE: Multidetector CT imaging of the abdomen and pelvis was performed using the standard protocol following bolus administration of intravenous contrast.  CONTRAST:  32mL OMNIPAQUE IOHEXOL 300 MG/ML SOLN, OMNIPAQUE IOHEXOL 300 MG/ML SOLN  COMPARISON:  None.  FINDINGS: Sagittal images of the spine shows diffuse osteopenia. Degenerative changes thoracolumbar spine. The lung bases are unremarkable.  The patient is status post cholecystectomy. Mild intrahepatic biliary ductal dilatation. There is CBD dilatation up to 1.1 cm. No focal hepatic mass. The pancreas, spleen  and adrenal glands are unremarkable. Kidneys are symmetrical in size and enhancement. No hydronephrosis or hydroureter. Delayed renal images shows bilateral renal symmetrical excretion.  Atherosclerotic calcifications of abdominal aorta and iliac arteries. Moderate stool noted in right colon and proximal transverse colon. No colonic obstruction. The descending colon and sigmoid colon are empty collapsed. Some stool noted within rectum.  There are fluid distended small bowel loops in mid upper abdomen and left lower abdomen and pelvis. Small amount of fluid/stranding noted a adjacent to small bowel loops in left lower quadrant see axial image 57. Distal small bowel is decreased caliber collapsed. Findings are consistent with small bowel obstruction.  Mild distended urinary bladder. The patient is status post hysterectomy. The terminal ileum is small caliber decompressed. Contrast material is noted within stomach. No any contrast material is noted within small bowel or colon.  IMPRESSION: 1. There are fluid distended small bowel loops with multiple air-fluid levels highly suspicious for small bowel obstruction. Distal small bowel is small caliber decompressed. Terminal ileum is small caliber. Small amount of fluid/stranding noted adjacent to small bowel loops in left lower quadrant please see images 49 and 57. 2. Moderate stool noted in right colon and proximal transverse colon. Descending colon and sigmoid colon are empty collapsed. 3. No pericecal inflammation. 4. No hydronephrosis or hydroureter. 5. Status post cholecystectomy. Mild intrahepatic biliary ductal dilatation. CBD dilatation up to 1.1 cm. 6. No hydronephrosis or hydroureter. 7. Status post hysterectomy. These results were called by telephone at the time of interpretation on 03/18/2015 at 8:59 am to Dr. Gerhard Munch , who verbally acknowledged these results.   Electronically Signed   By: Natasha Mead M.D.   On: 03/18/2015 08:59    EKG: Independently  reviewed. Normal sinus rhythm with PVCs. No ST-T changes  Assessment/Plan Active Problems:   Small bowel obstruction Likely secondary to disease. NG placed in the ED. Manage conservatively. Strict nothing by mouth. Serial abdominal exam. Follow-up x-ray in the morning. IV hydration with normal saline. Pain control with when necessary morphine. When necessary Zofran for nausea and vomiting. Surgery following.   Type 2 diabetes mellitus Hold metformin. Monitor on sliding scale.  Coronary artery disease Stable. Holding aspirin due to nothing by mouth status.  Anxiety Switch by mouth Ativan to IV  Prn  Chronic kidney disease Stable  Elevated blood pressure Not on medications at  home. Likely related to pain. Will place on when necessary hydralazine  Remaining medical issues are stable. All by mouth home medications on hold due to nothing by mouth status.  DVT prophylaxis: sq lovenox  Diet: Nothing by mouth   Code Status: full code Family Communication:discussed with her 3 daughters at bedside Disposition Plan: admit o medical floor expected discharge date if clinical Improvement without  surgery is 5/9.  Eddie North [facility] Pager [phone]  Total time spent on admission :70 minutes  If 7PM-7AM, please contact night-coverage [email or web address] Password [email or web address] 03/18/2015, 10:33 AM

## 2015-03-18 NOTE — ED Provider Notes (Signed)
CSN: [id number]     Arrival date & time 03/18/15  5284 History   First MD Initiated Contact with Patient 03/18/15 0701     Chief Complaint  Patient presents with  . Nausea   HPI  Patient presents with concern of nausea, vomiting, weakness. Symptoms began about one week ago, have noticeably increased over the past 4 days. She has also developed new lower abdominal pain, diffuse, crampy, sharp, severe. No clear alleviating or exacerbating factors. No diarrhea. No chest pain, dyspnea. There is associated weakness, anorexia, but no syncope, confusion, disorientation. Patient has multiple medical issues, acknowledges substantial life stress.   Past Medical History  Diagnosis Date  . CAD (coronary artery disease)     s/p stenting of LAD 1999- cath 5-08 EF normal LAD 30-40% restenosis. D1 50% D2 80% LCX & RCA minimal plaque  . HTN (hypertension)   . Hyperlipemia   . Anemia     iron deficiency  . Depression   . DVT (deep venous thrombosis)   . Gout   . Osteoporosis   . Pancreatitis   . GERD (gastroesophageal reflux disease)   . Renal insufficiency     Cr 1.2-1.3  . Obesity   . Diabetes mellitus   . Polyarthritis     DJD/ possible PMR  . Vitamin D deficiency   . B12 deficiency   . Tinnitus   . Anxiety   . Aneurysm, thoracic aortic   . Constipation   . Urinary frequency   . Vertigo   . Chronic back pain    Past Surgical History  Procedure Laterality Date  . Hemorrhoid surgery    . Abdominal hysterectomy    . Cholecystectomy    . Tubal ligation     Family History  Problem Relation Age of Onset  . Adopted: Yes  . Diabetes Mother   . Hypertension Father    History  Substance Use Topics  . Smoking status: Never Smoker   . Smokeless tobacco: Not on file  . Alcohol Use: No   OB History    No data available     Review of Systems  Constitutional:       Per HPI, otherwise negative  HENT:       Per HPI, otherwise negative  Respiratory:       Per HPI, otherwise  negative  Cardiovascular:       Per HPI, otherwise negative  Gastrointestinal: Positive for nausea, vomiting and abdominal pain.  Endocrine:       Negative aside from HPI  Genitourinary:       Neg aside from HPI   Musculoskeletal:       Per HPI, otherwise negative  Skin: Negative.   Neurological: Positive for weakness. Negative for syncope.      Allergies  Amlodipine besylate; Aspirin; Atenolol; Benazepril; Benicar; Cozaar; Hydrochlorothiazide w-triamterene; Hydrocodone; Hydroxyzine pamoate; Iodine; Lisinopril; Penicillins; Pravastatin; Prednisolone; and Tramadol hcl  Home Medications   Prior to Admission medications   Medication Sig Start Date End Date Taking? Authorizing Provider  acetaminophen (TYLENOL) 650 MG CR tablet Take 1,300 mg by mouth every 8 (eight) hours as needed for pain.     Historical Provider, MD  aspirin 325 MG tablet Take 325 mg by mouth daily.    Historical Provider, MD  cholecalciferol (VITAMIN D) 1000 UNITS tablet Take 2 tablets (2,000 Units total) by mouth daily. 04/17/13   Aleksei Plotnikov V, MD  Cyanocobalamin (VITAMIN B-12) 1000 MCG SUBL Place 1 tablet (1,000 mcg total) under  the tongue daily. 12/03/13   Aleksei Plotnikov V, MD  furosemide (LASIX) 20 MG tablet Take 1 tablet (20 mg total) by mouth daily as needed for edema. 03/10/15 03/09/16  Aleksei Plotnikov V, MD  LORazepam (ATIVAN) 1 MG tablet Take 0.5 tablets (0.5 mg total) by mouth every 8 (eight) hours as needed for anxiety. 05/07/13   Bethann Berkshire, MD  metFORMIN (GLUCOPHAGE) 500 MG tablet TAKE 1 TABLET TWICE DAILY  WITH  A  MEAL 08/23/14   Aleksei Plotnikov V, MD  oxybutynin (DITROPAN) 5 MG tablet Take 1 tablet (5 mg total) by mouth 2 (two) times daily. 11/25/14   Aleksei Plotnikov V, MD   BP 157/97 mmHg  Pulse 88  Temp(Src) 99 F (37.2 C) (Oral)  Resp 13  Ht 5\' 4"  (1.626 m)  Wt 141 lb (63.957 kg)  BMI 24.19 kg/m2  SpO2 100% Physical Exam  Constitutional: She is oriented to person, place, and  time. She appears ill.  HENT:  Head: Normocephalic and atraumatic.  Eyes: Conjunctivae and EOM are normal.  Cardiovascular: Normal rate and regular rhythm.   Pulmonary/Chest: Effort normal and breath sounds normal. No stridor. No respiratory distress.  Abdominal: She exhibits no distension. There is tenderness in the right lower quadrant, suprapubic area and left lower quadrant.  Musculoskeletal: She exhibits no edema.  Neurological: She is alert and oriented to person, place, and time. She displays atrophy. She displays no tremor. No cranial nerve deficit. She exhibits normal muscle tone. She displays no seizure activity. Coordination normal.  Skin: Skin is warm and dry.  Psychiatric: She has a normal mood and affect. Her speech is normal and behavior is normal.  Nursing note and vitals reviewed.   ED Course  Procedures (including critical care time) Labs Review Labs Reviewed  COMPREHENSIVE METABOLIC PANEL - Abnormal; Notable for the following:    Glucose, Bld 138 (*)    Creatinine, Ser 1.06 (*)    Total Protein 9.3 (*)    ALT 13 (*)    GFR calc non Af Amer 49 (*)    GFR calc Af Amer 56 (*)    All other components within normal limits  CBC WITH DIFFERENTIAL/PLATELET - Abnormal; Notable for the following:    Hemoglobin 11.7 (*)    Neutrophils Relative % 79 (*)    All other components within normal limits  LIPASE, BLOOD  URINALYSIS, ROUTINE W REFLEX MICROSCOPIC    Imaging Review Ct Abdomen Pelvis W Contrast  03/18/2015   CLINICAL DATA:  Nausea and vomiting for 4 days, anxiety, status post hysterectomy CT pelvis 05/27/2014  EXAM: CT ABDOMEN AND PELVIS WITH CONTRAST  TECHNIQUE: Multidetector CT imaging of the abdomen and pelvis was performed using the standard protocol following bolus administration of intravenous contrast.  CONTRAST:  57mL OMNIPAQUE IOHEXOL 300 MG/ML SOLN, 45m OMNIPAQUE IOHEXOL 300 MG/ML SOLN  COMPARISON:  None.  FINDINGS: Sagittal images of the spine shows diffuse  osteopenia. Degenerative changes thoracolumbar spine. The lung bases are unremarkable.  The patient is status post cholecystectomy. Mild intrahepatic biliary ductal dilatation. There is CBD dilatation up to 1.1 cm. No focal hepatic mass. The pancreas, spleen and adrenal glands are unremarkable. Kidneys are symmetrical in size and enhancement. No hydronephrosis or hydroureter. Delayed renal images shows bilateral renal symmetrical excretion.  Atherosclerotic calcifications of abdominal aorta and iliac arteries. Moderate stool noted in right colon and proximal transverse colon. No colonic obstruction. The descending colon and sigmoid colon are empty collapsed. Some stool noted within rectum.  There are fluid distended small bowel loops in mid upper abdomen and left lower abdomen and pelvis. Small amount of fluid/stranding noted a adjacent to small bowel loops in left lower quadrant see axial image 57. Distal small bowel is decreased caliber collapsed. Findings are consistent with small bowel obstruction.  Mild distended urinary bladder. The patient is status post hysterectomy. The terminal ileum is small caliber decompressed. Contrast material is noted within stomach. No any contrast material is noted within small bowel or colon.  IMPRESSION: 1. There are fluid distended small bowel loops with multiple air-fluid levels highly suspicious for small bowel obstruction. Distal small bowel is small caliber decompressed. Terminal ileum is small caliber. Small amount of fluid/stranding noted adjacent to small bowel loops in left lower quadrant please see images 49 and 57. 2. Moderate stool noted in right colon and proximal transverse colon. Descending colon and sigmoid colon are empty collapsed. 3. No pericecal inflammation. 4. No hydronephrosis or hydroureter. 5. Status post cholecystectomy. Mild intrahepatic biliary ductal dilatation. CBD dilatation up to 1.1 cm. 6. No hydronephrosis or hydroureter. 7. Status post  hysterectomy. These results were called by telephone at the time of interpretation on 03/18/2015 at 8:59 am to Dr. Gerhard Munch , who verbally acknowledged these results.   Electronically Signed   By: Natasha Mead M.D.   On: 03/18/2015 08:59   I reviewed the CT imaging myself, discussed the findings with our radiologist. Subsequently have discussed patient's case with our surgical team.    EKG Interpretation   Date/Time:  Friday Mar 18 2015 06:56:53 EDT Ventricular Rate:  85 PR Interval:  165 QRS Duration: 77 QT Interval:  373 QTC Calculation: 443 R Axis:   46 Text Interpretation:  Sinus rhythm Ventricular premature complex Abnormal  R-wave progression, early transition Sinus rhythm Artifact Abnormal ekg  Confirmed by Gerhard Munch  MD ([id number]) on 03/18/2015 7:04:49 AM     9:08 AM On repeat exam the patient is in discomfort. She remained hemodynamically stable, though mildly hypertensive. I discussed all findings with the patient and a family member.  We will plan for NG tube placement.  I discussed patient's case with our surgery team. The request hospitalist admission for management.  MDM   Elderly female presents nausea, vomiting, abdominal pain. Patient has CT evidence for bowel obstruction. Patient required multiple doses of IV narcotics, fluid resuscitation for symptomatic relief. Patient required admission for further evaluation and management     Gerhard Munch, MD 03/18/15 [phone]

## 2015-03-18 NOTE — ED Notes (Signed)
Bed: WA24 Expected date:  Expected time:  Means of arrival:  Comments: EMS-N/V 

## 2015-03-18 NOTE — ED Notes (Signed)
Pt was placed on a bed pan for a length period (at her request cause it takes her a while to go) but she was still unable to provide one at this time.

## 2015-03-18 NOTE — Progress Notes (Signed)
At bedside to start new PIV. Per family, PIV in L A/C is positional and beeps but has not since pt repositioned. Good blood return and flush. It is the family's request not to restart at this time. Floor RN notified.

## 2015-03-18 NOTE — ED Notes (Signed)
Per EMS pt reports N/V x 4 days as well as anxiety.  Pt states that she has been under a lot of stress lately.  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

## 2015-03-18 NOTE — Progress Notes (Signed)
ED report received from [facility]: pt elevated BP 199/95 and HR 122, not addressed by ED physician. Requested to obtain order for BP prior to transfer to floor.

## 2015-03-18 NOTE — Consult Note (Signed)
Reason for Consult:  SBO Referring Physician: Dr. Carmin Muskrat ER  [name] is an 81 y.o. female.  HPI: Pt presents with about 1 week of lower abdominal cramps, that became progressively worse, now having nausea, vomiting and weakness.  Some of the family knew she was sick, but most did not. She says she was constipated with no BM for some time, her abdomen became distended, and over the last 2 days having nausea and vomiting.  She has not really kept anything down for 2 days.  Work up shows BP up some, but no fever. Creatinine is minimally up, but she does have a hx of mild renal insuffiencey last year.  WBC is normal.  CT scan shows:  There are fluid distended small bowel loops with multiple air-fluid levels highly suspicious for small bowel obstruction. Distal small bowel is small caliber decompressed. Terminal ileum is small caliber. Small amount of fluid/stranding noted adjacent to small bowel loops in left lower quadrant. No pericecal inflammation.  Status post cholecystectomy. Mild intrahepatic biliary ductal dilatation. CBD dilatation up to 1.1 cm. Prior hysterectomy, no hydronephrosis, or hydroureter. We are ask to see.  Past Medical History  Diagnosis Date  CAD (coronary artery disease)    s/p stenting of LAD 1999- cath 5-08 EF normal LAD 30-40% restenosis. D1 50% D2 80% LCX & RCA minimal plaque  Multiple medical allergies  15 listed   HTN (hypertension)   Hyperlipemia   Anemia    iron deficiency  Depression   DVT (deep venous thrombosis)   Gout   Osteoporosis   Pancreatitis   GERD (gastroesophageal reflux disease)   Renal insufficiency    Cr 1.2-1.3  Obesity   Diabetes mellitus   Polyarthritis    DJD/ possible PMR  Vitamin D deficiency   B12 deficiency   Tinnitus   Anxiety   Aneurysm, thoracic aortic   Constipation   Urinary frequency   Vertigo   Chronic back pain     Past Surgical History  Procedure Laterality Date  . Hemorrhoid surgery    .  Abdominal hysterectomy    . Cholecystectomy    . Tubal ligation      Family History  Problem Relation Age of Onset  . Adopted: Yes  . Diabetes Mother   . Hypertension Father     Social History:  reports that she has never smoked. She does not have any smokeless tobacco history on file. She reports that she does not drink alcohol or use illicit drugs.  Allergies:  Allergies  Allergen Reactions  . Amlodipine Besylate     REACTION: dizzy  . Aspirin   . Atenolol     REACTION: fatigue  . Benazepril     cough  . Benicar [Olmesartan Medoxomil]     HA  . Cozaar     nausea  . Hydrochlorothiazide W-Triamterene     REACTION: dizzy  . Hydrocodone     REACTION: HA  . Hydroxyzine Pamoate   . Iodine   . Lisinopril     REACTION: tired, cough  . Penicillins   . Pravastatin     myalgias  . Prednisolone     My stomach hurts  . Tramadol Hcl     REACTION: HA    Prior to Admission medications   Medication Sig Start Date End Date Taking? Authorizing Provider  acetaminophen (TYLENOL) 650 MG CR tablet Take 1,300 mg by mouth every 8 (eight) hours as needed for pain.    Yes Historical  Provider, MD  aspirin 325 MG tablet Take 325 mg by mouth daily.   Yes Historical Provider, MD  cholecalciferol (VITAMIN D) 1000 UNITS tablet Take 2 tablets (2,000 Units total) by mouth daily. 04/17/13  Yes Aleksei Plotnikov V, MD  Cyanocobalamin (VITAMIN B-12) 1000 MCG SUBL Place 1 tablet (1,000 mcg total) under the tongue daily. 12/03/13  Yes Aleksei Plotnikov V, MD  furosemide (LASIX) 20 MG tablet Take 1 tablet (20 mg total) by mouth daily as needed for edema. 03/10/15 03/09/16 Yes Aleksei Plotnikov V, MD  OVER THE COUNTER MEDICATION Apply 1 application topically 2 (two) times daily as needed (dryness). Diabetic Lotion.   Yes Historical Provider, MD  Polyethyl Glycol-Propyl Glycol (SYSTANE OP) Apply 1-2 drops to eye daily as needed (dry eyes).   Yes Historical Provider, MD  LORazepam (ATIVAN) 1 MG tablet Take  0.5 tablets (0.5 mg total) by mouth every 8 (eight) hours as needed for anxiety. Patient not taking: Reported on 03/18/2015 05/07/13   Milton Ferguson, MD  metFORMIN (GLUCOPHAGE) 500 MG tablet TAKE 1 TABLET TWICE DAILY  WITH  A  MEAL 08/23/14   Aleksei Plotnikov V, MD  oxybutynin (DITROPAN) 5 MG tablet Take 1 tablet (5 mg total) by mouth 2 (two) times daily. 11/25/14   Cassandria Anger, MD     Results for orders placed or performed during the hospital encounter of 03/18/15 (from the past 48 hour(s))  Comprehensive metabolic panel     Status: Abnormal   Collection Time: 03/18/15  7:14 AM  Result Value Ref Range   Sodium 135 135 - 145 mmol/L   Potassium 4.1 3.5 - 5.1 mmol/L   Chloride 101 101 - 111 mmol/L   CO2 24 22 - 32 mmol/L   Glucose, Bld 138 (H) 70 - 99 mg/dL   BUN 20 6 - 20 mg/dL   Creatinine, Ser 1.06 (H) 0.44 - 1.00 mg/dL   Calcium 10.1 8.9 - 10.3 mg/dL   Total Protein 9.3 (H) 6.5 - 8.1 g/dL   Albumin 4.7 3.5 - 5.0 g/dL   AST 26 15 - 41 U/L   ALT 13 (L) 14 - 54 U/L   Alkaline Phosphatase 69 38 - 126 U/L   Total Bilirubin 0.7 0.3 - 1.2 mg/dL   GFR calc non Af Amer 49 (L) >60 mL/min   GFR calc Af Amer 56 (L) >60 mL/min    Comment: (NOTE) The eGFR has been calculated using the CKD EPI equation. This calculation has not been validated in all clinical situations. eGFR's persistently <60 mL/min signify possible Chronic Kidney Disease.    Anion gap 10 5 - 15  CBC with Differential     Status: Abnormal   Collection Time: 03/18/15  7:14 AM  Result Value Ref Range   WBC 6.7 4.0 - 10.5 K/uL   RBC 3.89 3.87 - 5.11 MIL/uL   Hemoglobin 11.7 (L) 12.0 - 15.0 g/dL   HCT 36.0 36.0 - 46.0 %   MCV 92.5 78.0 - 100.0 fL   MCH 30.1 26.0 - 34.0 pg   MCHC 32.5 30.0 - 36.0 g/dL   RDW 12.7 11.5 - 15.5 %   Platelets 352 150 - 400 K/uL   Neutrophils Relative % 79 (H) 43 - 77 %   Neutro Abs 5.3 1.7 - 7.7 K/uL   Lymphocytes Relative 14 12 - 46 %   Lymphs Abs 0.9 0.7 - 4.0 K/uL   Monocytes  Relative 6 3 - 12 %   Monocytes Absolute 0.4  0.1 - 1.0 K/uL   Eosinophils Relative 1 0 - 5 %   Eosinophils Absolute 0.0 0.0 - 0.7 K/uL   Basophils Relative 0 0 - 1 %   Basophils Absolute 0.0 0.0 - 0.1 K/uL  Lipase, blood     Status: None   Collection Time: 03/18/15  7:14 AM  Result Value Ref Range   Lipase 30 22 - 51 U/L    Ct Abdomen Pelvis W Contrast  03/18/2015   CLINICAL DATA:  Nausea and vomiting for 4 days, anxiety, status post hysterectomy CT pelvis 05/27/2014  EXAM: CT ABDOMEN AND PELVIS WITH CONTRAST  TECHNIQUE: Multidetector CT imaging of the abdomen and pelvis was performed using the standard protocol following bolus administration of intravenous contrast.  CONTRAST:  72m OMNIPAQUE IOHEXOL 300 MG/ML SOLN, 1029mOMNIPAQUE IOHEXOL 300 MG/ML SOLN  COMPARISON:  None.  FINDINGS: Sagittal images of the spine shows diffuse osteopenia. Degenerative changes thoracolumbar spine. The lung bases are unremarkable.  The patient is status post cholecystectomy. Mild intrahepatic biliary ductal dilatation. There is CBD dilatation up to 1.1 cm. No focal hepatic mass. The pancreas, spleen and adrenal glands are unremarkable. Kidneys are symmetrical in size and enhancement. No hydronephrosis or hydroureter. Delayed renal images shows bilateral renal symmetrical excretion.  Atherosclerotic calcifications of abdominal aorta and iliac arteries. Moderate stool noted in right colon and proximal transverse colon. No colonic obstruction. The descending colon and sigmoid colon are empty collapsed. Some stool noted within rectum.  There are fluid distended small bowel loops in mid upper abdomen and left lower abdomen and pelvis. Small amount of fluid/stranding noted a adjacent to small bowel loops in left lower quadrant see axial image 57. Distal small bowel is decreased caliber collapsed. Findings are consistent with small bowel obstruction.  Mild distended urinary bladder. The patient is status post hysterectomy. The  terminal ileum is small caliber decompressed. Contrast material is noted within stomach. No any contrast material is noted within small bowel or colon.  IMPRESSION: 1. There are fluid distended small bowel loops with multiple air-fluid levels highly suspicious for small bowel obstruction. Distal small bowel is small caliber decompressed. Terminal ileum is small caliber. Small amount of fluid/stranding noted adjacent to small bowel loops in left lower quadrant please see images 49 and 57. 2. Moderate stool noted in right colon and proximal transverse colon. Descending colon and sigmoid colon are empty collapsed. 3. No pericecal inflammation. 4. No hydronephrosis or hydroureter. 5. Status post cholecystectomy. Mild intrahepatic biliary ductal dilatation. CBD dilatation up to 1.1 cm. 6. No hydronephrosis or hydroureter. 7. Status post hysterectomy. These results were called by telephone at the time of interpretation on 03/18/2015 at 8:59 am to Dr. ROCarmin Muskrat who verbally acknowledged these results.   Electronically Signed   By: LiLahoma Crocker.D.   On: 03/18/2015 08:59    Review of Systems  Constitutional: Positive for fever and malaise/fatigue. Negative for chills, weight loss and diaphoresis.  HENT: Negative.   Eyes: Negative.   Respiratory: Positive for cough and shortness of breath (lying flat or with exertion).   Cardiovascular: Negative.   Gastrointestinal: Positive for nausea, vomiting, abdominal pain and diarrhea (multiple stools yesterday). Negative for heartburn.  Genitourinary: Negative.   Musculoskeletal: Negative.   Skin: Negative.   Neurological: Positive for weakness. Negative for dizziness, tingling, tremors, sensory change, speech change, focal weakness, seizures and loss of consciousness.  Endo/Heme/Allergies: Negative.   Psychiatric/Behavioral: Negative.    Blood pressure 188/73, pulse 86, temperature 99 F (  37.2 C), temperature source Oral, resp. rate 12, height 5' 4"  (1.626 m),  weight 63.957 kg (141 lb), SpO2 100 %. Physical Exam  Constitutional: She is oriented to person, place, and time.  Elderly female, complaining of pain BP up, but sleepy after NG placed.  HENT:  Head: Normocephalic and atraumatic.  NG  Placed   Eyes: Conjunctivae and EOM are normal. Right eye exhibits no discharge. Left eye exhibits discharge. No scleral icterus.  Neck: Neck supple. No JVD present. No tracheal deviation present.  Cardiovascular: Normal rate, regular rhythm, normal heart sounds and intact distal pulses.   No murmur heard. Respiratory: Effort normal. No respiratory distress. She has no wheezes. She has rales. She exhibits no tenderness.  GI: She exhibits distension. She exhibits no mass. There is tenderness. There is no rebound and no guarding.  BS hyperactive Midline incision and old RUQ cholecystectomy scars  Musculoskeletal: She exhibits no edema.  Lymphadenopathy:    She has no cervical adenopathy.  Neurological: She is oriented to person, place, and time. No cranial nerve deficit.  sleepy  Skin: Skin is warm and dry. No rash noted. No erythema. No pallor.  Psychiatric: She has a normal mood and affect. Her behavior is normal. Judgment and thought content normal.    Assessment/Plan: SBO with hx of abdominal hysterectomy and open cholecystectomy CAD with prior MI and stents Multiple medicine allergies  (15 listed) Hypertension Hyperlipidemia Anemia AODM Arthritis Anxiety and depression history Chronic back pain Vertigo  Plan:  Admit to medicine, bowel rest, NG decompression, hydration, small bowel protocol.  [name] 03/18/2015, 9:09 AM

## 2015-03-18 NOTE — Clinical Social Work Note (Signed)
Clinical Social Work Assessment  Patient Details  Name: [name] MRN: [id number] Date of Birth: June 16, 1933  Date of referral:  03/18/15               Reason for consult:  Other (Comment Required) (pt is primary care giver for pt husband who is blind and has demetntia)                Permission sought to share information with:  Family Supports Permission granted to share information::  No  Name::        Agency::     Relationship::     Contact Information:     Housing/Transportation Living arrangements for the past 2 months:  Single Family Home Source of Information:  Patient Patient Interpreter Needed:  None Criminal Activity/Legal Involvement Pertinent to Current Situation/Hospitalization:  No - Comment as needed Significant Relationships:  Adult Children Lives with:  Significant Other Do you feel safe going back to the place where you live?  Yes Need for family participation in patient care:     Care giving concerns: Patient shared with nurses that she is primary care giver for patient husabnd who is blind and has dementia. There is concern regarding pt continuing to care for husband due to advancing dementia. Patient shared with nurse that patient husband has dementia and at night when she sleeps is fearful. When csw attempted to assess patient, patient did not share more to this. Patient more conncerned about husband care while in the hosptial and after.   Social Worker assessment / plan:  CSW, pt, and pt daughter discussed possible resources for assisted living/skilled nursing placement for patient husband if needed. Pattient daughter plans to stay with pt husband while pt in the hospital    Employment status:  Retired Database administrator PT Recommendations:  Not assessed at this time Information / Referral to community resources:  Skilled [employer], Other (Comment Required) (resources for pt husband )  Patient/Family's Response to care:   Patient family able to help patient with needs upon discharge. At this time it is unclear if patient will need any further assistance.   Patient/Family's Understanding of and Emotional Response to Diagnosis, Current Treatment, and Prognosis:  Pt and pt family are understanding on need for hospital admission for further evaluation.   Emotional Assessment Appearance:  Disheveled Attitude/Demeanor/Rapport:    Affect (typically observed):  Quiet Orientation:  Oriented to Self, Oriented to Place, Oriented to  Time, Oriented to Situation Alcohol / Substance use:    Psych involvement (Current and /or in the community):  No (Comment)  Discharge Needs  Concerns to be addressed:  No discharge needs identified Readmission within the last 30 days:  No Current discharge risk:    Barriers to Discharge:  No Barriers Identified  No further Clinical Social Work needs, signing off.   [name] A, LCSW 03/18/2015, 1:05 PM

## 2015-03-19 ENCOUNTER — Inpatient Hospital Stay (HOSPITAL_COMMUNITY): Payer: Commercial Managed Care - HMO

## 2015-03-19 LAB — CBC
HCT: 35.2 % — ABNORMAL LOW (ref 36.0–46.0)
HEMOGLOBIN: 11.3 g/dL — AB (ref 12.0–15.0)
MCH: 29.7 pg (ref 26.0–34.0)
MCHC: 32.1 g/dL (ref 30.0–36.0)
MCV: 92.6 fL (ref 78.0–100.0)
Platelets: 361 10*3/uL (ref 150–400)
RBC: 3.8 MIL/uL — ABNORMAL LOW (ref 3.87–5.11)
RDW: 12.9 % (ref 11.5–15.5)
WBC: 6.8 10*3/uL (ref 4.0–10.5)

## 2015-03-19 LAB — BASIC METABOLIC PANEL
Anion gap: 11 (ref 5–15)
BUN: 32 mg/dL — ABNORMAL HIGH (ref 6–20)
CO2: 21 mmol/L — AB (ref 22–32)
CREATININE: 1.33 mg/dL — AB (ref 0.44–1.00)
Calcium: 8.5 mg/dL — ABNORMAL LOW (ref 8.9–10.3)
Chloride: 109 mmol/L (ref 101–111)
GFR calc Af Amer: 43 mL/min — ABNORMAL LOW (ref >60)
GFR, EST NON AFRICAN AMERICAN: 37 mL/min — AB (ref >60)
GLUCOSE: 137 mg/dL — AB (ref 70–99)
Potassium: 4.3 mmol/L (ref 3.5–5.1)
SODIUM: 141 mmol/L (ref 135–145)

## 2015-03-19 LAB — GLUCOSE, CAPILLARY
GLUCOSE-CAPILLARY: 118 mg/dL — AB (ref 70–99)
GLUCOSE-CAPILLARY: 121 mg/dL — AB (ref 70–99)
GLUCOSE-CAPILLARY: 121 mg/dL — AB (ref 70–99)
GLUCOSE-CAPILLARY: 89 mg/dL (ref 70–99)
Glucose-Capillary: 130 mg/dL — ABNORMAL HIGH (ref 70–99)
Glucose-Capillary: 91 mg/dL (ref 70–99)

## 2015-03-19 MED ORDER — MORPHINE SULFATE 2 MG/ML IJ SOLN
2.0000 mg | INTRAMUSCULAR | Status: DC | PRN
  Administered 2015-03-19 – 2015-03-20 (×2): 2 mg via INTRAVENOUS
  Filled 2015-03-19 (×3): qty 1

## 2015-03-19 NOTE — Progress Notes (Signed)
Patient admitted to [facility], alert and oriented, transferred from ED on hospital bed by Primary Nurse, tolerated well, family at bedside, Dr notified of patient's arrival to unit, BP = 186/97, P= 84, Temp = 97.9, Sats= 100% on RA, NG tube in right nares, clamped, skin dry and intact, pain level 10/10 the worst pain ever stated patient, patient in stable condition at this time, will continue to monitor patient's vital signs and notify to doctor for abnormal findings.

## 2015-03-19 NOTE — Progress Notes (Signed)
Small bowel obstruction  Subjective: Pt starting to have more flatus.  Denies abd pain  Objective: Vital signs in last 24 hours: Temp:  [97.3 F (36.3 C)-98.5 F (36.9 C)] 98.5 F (36.9 C) (05/07 0542) Pulse Rate:  [84-119] 100 (05/07 0542) Resp:  [16-18] 18 (05/07 0542) BP: (137-186)/(77-97) 159/77 mmHg (05/07 0542) SpO2:  [98 %-100 %] 98 % (05/07 0542)    Intake/Output from previous day: 05/06 0701 - 05/07 0700 In: 1313.8 [I.V.:1313.8] Out: 700 [Emesis/NG output:700] Intake/Output this shift: Total I/O In: -  Out: 200 [Emesis/NG output:200]  General appearance: alert and cooperative GI: soft, nontender  Lab Results:  Results for orders placed or performed during the hospital encounter of 03/18/15 (from the past 24 hour(s))  Glucose, capillary     Status: Abnormal   Collection Time: 03/18/15  4:55 PM  Result Value Ref Range   Glucose-Capillary 185 (H) 70 - 99 mg/dL  Glucose, capillary     Status: Abnormal   Collection Time: 03/18/15 10:27 PM  Result Value Ref Range   Glucose-Capillary 144 (H) 70 - 99 mg/dL  Glucose, capillary     Status: Abnormal   Collection Time: 03/19/15 12:44 AM  Result Value Ref Range   Glucose-Capillary 118 (H) 70 - 99 mg/dL  Glucose, capillary     Status: Abnormal   Collection Time: 03/19/15  4:14 AM  Result Value Ref Range   Glucose-Capillary 121 (H) 70 - 99 mg/dL   Comment 1 Notify RN   Basic metabolic panel     Status: Abnormal   Collection Time: 03/19/15  5:52 AM  Result Value Ref Range   Sodium 141 135 - 145 mmol/L   Potassium 4.3 3.5 - 5.1 mmol/L   Chloride 109 101 - 111 mmol/L   CO2 21 (L) 22 - 32 mmol/L   Glucose, Bld 137 (H) 70 - 99 mg/dL   BUN 32 (H) 6 - 20 mg/dL   Creatinine, Ser 6.27 (H) 0.44 - 1.00 mg/dL   Calcium 8.5 (L) 8.9 - 10.3 mg/dL   GFR calc non Af Amer 37 (L) >60 mL/min   GFR calc Af Amer 43 (L) >60 mL/min   Anion gap 11 5 - 15  CBC     Status: Abnormal   Collection Time: 03/19/15  5:52 AM  Result Value Ref  Range   WBC 6.8 4.0 - 10.5 K/uL   RBC 3.80 (L) 3.87 - 5.11 MIL/uL   Hemoglobin 11.3 (L) 12.0 - 15.0 g/dL   HCT 03.5 (L) 00.9 - 38.1 %   MCV 92.6 78.0 - 100.0 fL   MCH 29.7 26.0 - 34.0 pg   MCHC 32.1 30.0 - 36.0 g/dL   RDW 82.9 93.7 - 16.9 %   Platelets 361 150 - 400 K/uL  Glucose, capillary     Status: Abnormal   Collection Time: 03/19/15  7:38 AM  Result Value Ref Range   Glucose-Capillary 130 (H) 70 - 99 mg/dL     Studies/Results Radiology     MEDS, Scheduled . sodium chloride   Intravenous STAT  . enoxaparin (LOVENOX) injection  40 mg Subcutaneous Q24H  . insulin aspart  0-9 Units Subcutaneous 6 times per day     Assessment: Small bowel obstruction KUB shows no major progression of contrast, but NG output is slowing and pt is having some flatus  Plan: OOB and ambulate today Recheck KUB in AM   LOS: 1 day    Vanita Panda, MD [facility], [place]  [phone]   03/19/2015 10:22 AM

## 2015-03-19 NOTE — Progress Notes (Signed)
[facility] PROGRESS NOTE  [name] HCW:[id number] DOB: Mar 03, 1933 DOA: 03/18/2015 PCP: [name], MD  Brief narrative 81 year old female with history of hypertension, coronary artery disease, hyperlipidemia, iron deficiency anemia, depression, osteoporosis, mild chronic kidney disease presented to the ED with ongoing nausea for  4 days and diffuse crampy lower abdominal pain since one day prior to admission. Associated poor by mouth intake and generalized weakness. Patient was found to have small bowel obstruction and admitted to medical floor under observation.  Assessment/Plan: Active Problems:  Small bowel obstruction Secondary to adhesions.  Continue NG to wall suction. Follow-up abdominal x-ray this morning shows persistence follow-up obstruction. Pain control with when necessary morphine and IV fentanyl. Frequency of IV morphine increased for better pain control.. When necessary Zofran for nausea and vomiting. Surgery following. -Continue serial abdominal exam and repeat abdominal x-ray in the morning.   Type 2 diabetes mellitus Holding metformin. Monitor on sliding scale.  Coronary artery disease Stable. Holding aspirin due to nothing by mouth status.  Anxiety Anemia when necessary IV Ativan.  Chronic kidney disease Slight worsening in her renal function today. Monitor with IV hydration.  Elevated blood pressure Not on medications at home. Likely related to pain. Monitor on when necessary IV hydralazine.     DVT prophylaxis: sq lovenox  Diet: Nothing by mouth  Code Status: Full code Family Communication: husband and son at bedside Disposition Plan: Continue inpatient monitoring given ongoing small bowel obstruction. Expected date of discharge 5/10   Consultants:  CCS  Procedures:  None  Antibiotics:  None  HPI/Subjective: Patient seen and examined. Reports being in pain and requests to increase pain medications.  Objective: Filed  Vitals:   03/19/15 0542  BP: 159/77  Pulse: 100  Temp: 98.5 F (36.9 C)  Resp: 18    Intake/Output Summary (Last 24 hours) at 03/19/15 1342 Last data filed at 03/19/15 1761  Gross per 24 hour  Intake 1313.75 ml  Output    900 ml  Net 413.75 ml   Filed Weights   03/18/15 0700  Weight: 63.957 kg (141 lb)    Exam:   General:  Elderly female lying in bed appears fatigued, in no acute distress  HEENT: No pallor, dry oral mucosa, NG in place draining biliary fluid, supple neck  Chest: Clear to auscultation bilaterally, no added sounds  CVS: Normal S1 and S2, no murmurs or gallop  GI: Soft, moderate distention, tender over supraumbilical area, bowel sounds present  Musculoskeletal: Warm, no edema  CNS: Alert and oriented   Data Reviewed: Basic Metabolic Panel:  Recent Labs Lab 03/18/15 0714 03/19/15 0552  NA 135 141  K 4.1 4.3  CL 101 109  CO2 24 21*  GLUCOSE 138* 137*  BUN 20 32*  CREATININE 1.06* 1.33*  CALCIUM 10.1 8.5*   Liver Function Tests:  Recent Labs Lab 03/18/15 0714  AST 26  ALT 13*  ALKPHOS 69  BILITOT 0.7  PROT 9.3*  ALBUMIN 4.7    Recent Labs Lab 03/18/15 0714  LIPASE 30   No results for input(s): AMMONIA in the last 168 hours. CBC:  Recent Labs Lab 03/18/15 0714 03/19/15 0552  WBC 6.7 6.8  NEUTROABS 5.3  --   HGB 11.7* 11.3*  HCT 36.0 35.2*  MCV 92.5 92.6  PLT 352 361   Cardiac Enzymes: No results for input(s): CKTOTAL, CKMB, CKMBINDEX, TROPONINI in the last 168 hours. BNP (last 3 results) No results for input(s): BNP in the last 8760 hours.  ProBNP (last 3 results) No results for input(s): PROBNP in the last 8760 hours.  CBG:  Recent Labs Lab 03/18/15 2227 03/19/15 0044 03/19/15 0414 03/19/15 0738 03/19/15 1155  GLUCAP 144* 118* 121* 130* 91    No results found for this or any previous visit (from the past 240 hour(s)).   Studies: Ct Abdomen Pelvis W Contrast  03/18/2015   CLINICAL DATA:  Nausea and  vomiting for 4 days, anxiety, status post hysterectomy CT pelvis 05/27/2014  EXAM: CT ABDOMEN AND PELVIS WITH CONTRAST  TECHNIQUE: Multidetector CT imaging of the abdomen and pelvis was performed using the standard protocol following bolus administration of intravenous contrast.  CONTRAST:  1mL OMNIPAQUE IOHEXOL 300 MG/ML SOLN, OMNIPAQUE IOHEXOL 300 MG/ML SOLN  COMPARISON:  None.  FINDINGS: Sagittal images of the spine shows diffuse osteopenia. Degenerative changes thoracolumbar spine. The lung bases are unremarkable.  The patient is status post cholecystectomy. Mild intrahepatic biliary ductal dilatation. There is CBD dilatation up to 1.1 cm. No focal hepatic mass. The pancreas, spleen and adrenal glands are unremarkable. Kidneys are symmetrical in size and enhancement. No hydronephrosis or hydroureter. Delayed renal images shows bilateral renal symmetrical excretion.  Atherosclerotic calcifications of abdominal aorta and iliac arteries. Moderate stool noted in right colon and proximal transverse colon. No colonic obstruction. The descending colon and sigmoid colon are empty collapsed. Some stool noted within rectum.  There are fluid distended small bowel loops in mid upper abdomen and left lower abdomen and pelvis. Small amount of fluid/stranding noted a adjacent to small bowel loops in left lower quadrant see axial image 57. Distal small bowel is decreased caliber collapsed. Findings are consistent with small bowel obstruction.  Mild distended urinary bladder. The patient is status post hysterectomy. The terminal ileum is small caliber decompressed. Contrast material is noted within stomach. No any contrast material is noted within small bowel or colon.  IMPRESSION: 1. There are fluid distended small bowel loops with multiple air-fluid levels highly suspicious for small bowel obstruction. Distal small bowel is small caliber decompressed. Terminal ileum is small caliber. Small amount of fluid/stranding  noted adjacent to small bowel loops in left lower quadrant please see images 49 and 57. 2. Moderate stool noted in right colon and proximal transverse colon. Descending colon and sigmoid colon are empty collapsed. 3. No pericecal inflammation. 4. No hydronephrosis or hydroureter. 5. Status post cholecystectomy. Mild intrahepatic biliary ductal dilatation. CBD dilatation up to 1.1 cm. 6. No hydronephrosis or hydroureter. 7. Status post hysterectomy. These results were called by telephone at the time of interpretation on 03/18/2015 at 8:59 am to Dr. Gerhard Munch , who verbally acknowledged these results.   Electronically Signed   By: Natasha Mead M.D.   On: 03/18/2015 08:59   Dg Abd Portable 1v-small Bowel Obstruction Protocol-initial, 8 Hr Delay  03/19/2015   CLINICAL DATA:  Small bowel obstruction  EXAM: PORTABLE ABDOMEN - 1 VIEW  COMPARISON:  Radiographs and CT 03/18/2015  FINDINGS: Nasogastric tube extends into the distal stomach. There is contrast in the gastric lumen. There is no significant contrast in the small bowel or colon. Dilated stacked loops of small bowel persist in the mid abdomen, probably unchanged. No free air is evident on this single AP supine portable radiograph.  IMPRESSION: Enteric contrast has not reached the colon. Persistent small bowel dilatation consistent with small-bowel obstruction.   Electronically Signed   By: Ellery Plunk M.D.   On: 03/19/2015 01:48   Dg Abd Portable 1v-small Bowel Protocol-position  Verification  03/18/2015   CLINICAL DATA:  Small bowel protocol, nasogastric tube placement  EXAM: PORTABLE ABDOMEN - 1 VIEW  COMPARISON:  Portable exam 1303 hours compared to CT abdomen and pelvis 03/18/2015  FINDINGS: Tip of nasogastric tube projects over distal gastric antrum or duodenal bulb, patient slightly rotated to the RIGHT.  Excreted contrast material within renal collecting systems and distended urinary bladder.  Air-filled loops of dilated small bowel noted in the  mid abdomen suspicious for small bowel obstruction.  Some gas and stool remain present within the colon.  Degenerative disc and facet disease changes thoracolumbar spine.  Osseous demineralization.  IMPRESSION: Tip of nasogastric tube projects over either the distal gastric antrum or the duodenal bulb region.  Dilated small bowel loops question small bowel obstruction.   Electronically Signed   By: Ulyses Southward M.D.   On: 03/18/2015 13:16    Scheduled Meds: . enoxaparin (LOVENOX) injection  40 mg Subcutaneous Q24H  . insulin aspart  0-9 Units Subcutaneous 6 times per day   Continuous Infusions: . sodium chloride 75 mL/hr (03/18/15 1220)      Time spent: 25 minutes    Ripley Bogosian  [facility] Pager [phone] If 7PM-7AM, please contact night-coverage at [email or web address], password [facility] 03/19/2015, 1:42 PM  LOS: 1 day

## 2015-03-20 ENCOUNTER — Inpatient Hospital Stay (HOSPITAL_COMMUNITY): Payer: Commercial Managed Care - HMO

## 2015-03-20 DIAGNOSIS — E440 Moderate protein-calorie malnutrition: Secondary | ICD-10-CM | POA: Diagnosis present

## 2015-03-20 LAB — BASIC METABOLIC PANEL WITH GFR
Anion gap: 11 (ref 5–15)
BUN: 36 mg/dL — ABNORMAL HIGH (ref 6–20)
CO2: 24 mmol/L (ref 22–32)
Calcium: 8.6 mg/dL — ABNORMAL LOW (ref 8.9–10.3)
Chloride: 109 mmol/L (ref 101–111)
Creatinine, Ser: 1.09 mg/dL — ABNORMAL HIGH (ref 0.44–1.00)
GFR calc Af Amer: 54 mL/min — ABNORMAL LOW
GFR calc non Af Amer: 47 mL/min — ABNORMAL LOW
Glucose, Bld: 94 mg/dL (ref 70–99)
Potassium: 4.4 mmol/L (ref 3.5–5.1)
Sodium: 144 mmol/L (ref 135–145)

## 2015-03-20 LAB — CBC
HCT: 31.9 % — ABNORMAL LOW (ref 36.0–46.0)
Hemoglobin: 10.1 g/dL — ABNORMAL LOW (ref 12.0–15.0)
MCH: 29.6 pg (ref 26.0–34.0)
MCHC: 31.7 g/dL (ref 30.0–36.0)
MCV: 93.5 fL (ref 78.0–100.0)
Platelets: 344 10*3/uL (ref 150–400)
RBC: 3.41 MIL/uL — AB (ref 3.87–5.11)
RDW: 13 % (ref 11.5–15.5)
WBC: 5.2 10*3/uL (ref 4.0–10.5)

## 2015-03-20 LAB — GLUCOSE, CAPILLARY
GLUCOSE-CAPILLARY: 101 mg/dL — AB (ref 70–99)
GLUCOSE-CAPILLARY: 142 mg/dL — AB (ref 70–99)
GLUCOSE-CAPILLARY: 149 mg/dL — AB (ref 70–99)
Glucose-Capillary: 133 mg/dL — ABNORMAL HIGH (ref 70–99)
Glucose-Capillary: 60 mg/dL — ABNORMAL LOW (ref 70–99)
Glucose-Capillary: 82 mg/dL (ref 70–99)
Glucose-Capillary: 98 mg/dL (ref 70–99)

## 2015-03-20 MED ORDER — MORPHINE SULFATE 2 MG/ML IJ SOLN
2.0000 mg | INTRAMUSCULAR | Status: DC | PRN
  Administered 2015-03-20 (×2): 2 mg via INTRAVENOUS
  Filled 2015-03-20 (×2): qty 1

## 2015-03-20 MED ORDER — DEXTROSE 50 % IV SOLN
INTRAVENOUS | Status: AC
  Filled 2015-03-20: qty 50

## 2015-03-20 MED ORDER — DEXTROSE-NACL 5-0.45 % IV SOLN
INTRAVENOUS | Status: AC
  Administered 2015-03-20 – 2015-03-21 (×3): via INTRAVENOUS

## 2015-03-20 MED ORDER — CLOTRIMAZOLE 1 % VA CREA
1.0000 | TOPICAL_CREAM | Freq: Every day | VAGINAL | Status: DC
  Administered 2015-03-21 – 2015-03-22 (×3): 1 via VAGINAL
  Filled 2015-03-20 (×2): qty 45

## 2015-03-20 MED ORDER — DEXTROSE 50 % IV SOLN
1.0000 | Freq: Once | INTRAVENOUS | Status: AC
  Administered 2015-03-20: 50 mL via INTRAVENOUS
  Filled 2015-03-20: qty 50

## 2015-03-20 MED ORDER — PHENOL 1.4 % MT LIQD
1.0000 | OROMUCOSAL | Status: DC | PRN
  Administered 2015-03-20: 1 via OROMUCOSAL
  Filled 2015-03-20: qty 177

## 2015-03-20 NOTE — Progress Notes (Signed)
Subjective: Complains of some soreness in abdomen, unchanged  Objective: Vital signs in last 24 hours: Temp:  [98.6 F (37 C)] 98.6 F (37 C) (05/07 2022) Pulse Rate:  [86-124] 124 (05/07 2022) Resp:  [18] 18 (05/07 2022) BP: (148-160)/(82-84) 160/82 mmHg (05/07 2022) SpO2:  [98 %-100 %] 98 % (05/07 2022) Last BM Date: 03/16/15 (per dtr)  Intake/Output from previous day: 05/07 0701 - 05/08 0700 In: 921.3 [I.V.:921.3] Out: 1500 [Emesis/NG output:1500] Intake/Output this shift:    Resp: clear to auscultation bilaterally Cardio: regular rate and rhythm GI: soft, mild tenderness. mild distension  Lab Results:   Recent Labs  03/19/15 0552 03/20/15 0530  WBC 6.8 5.2  HGB 11.3* 10.1*  HCT 35.2* 31.9*  PLT 361 344   BMET  Recent Labs  03/19/15 0552 03/20/15 0530  NA 141 144  K 4.3 4.4  CL 109 109  CO2 21* 24  GLUCOSE 137* 94  BUN 32* 36*  CREATININE 1.33* 1.09*  CALCIUM 8.5* 8.6*   PT/INR No results for input(s): LABPROT, INR in the last 72 hours. ABG No results for input(s): PHART, HCO3 in the last 72 hours.  Invalid input(s): PCO2, PO2  Studies/Results: Dg Abd Portable 1v  03/20/2015   CLINICAL DATA:  Followup small bowel obstruction.  EXAM: PORTABLE ABDOMEN - 1 VIEW  COMPARISON:  03/19/2015  FINDINGS: Mild small bowel dilation persists most evident in the left upper quadrant. There has been no substantial change from the prior study. Residual contrast is noted in the bladder, also similar to the prior exam. Nasogastric tube tip projects in the distal stomach.  IMPRESSION: Small bowel dilation persists consistent with a persistent partial small bowel obstruction. No change from the previous day's study.   Electronically Signed   By: Amie Portland M.D.   On: 03/20/2015 07:29   Dg Abd Portable 1v  03/19/2015   CLINICAL DATA:  Small bowel obstruction protocol. Small bowel obstruction.  EXAM: PORTABLE ABDOMEN - 1 VIEW  COMPARISON:  03/18/2015.  FINDINGS: This is  a 24 hr film. There is no contrast identified in the cecum. On the prior exam at 2046 hr yesterday, oral contrast was present in the stomach. Excreted contrast is present within the urinary bladder. Dilation of small bowel loops measures 36 mm. Cholecystectomy clips are present in the right upper quadrant.  There is less small bowel contrast than expected which may be due to dilution in the proximal small bowel. Nasogastric tube remains in the stomach with the tip at the antrum.  Large amount of stool is present in the colon.  IMPRESSION: Small bowel obstruction with no contrast identified in the cecum at 24 hr.   Electronically Signed   By: Andreas Newport M.D.   On: 03/19/2015 17:11   Dg Abd Portable 1v-small Bowel Obstruction Protocol-initial, 8 Hr Delay  03/19/2015   CLINICAL DATA:  Small bowel obstruction  EXAM: PORTABLE ABDOMEN - 1 VIEW  COMPARISON:  Radiographs and CT 03/18/2015  FINDINGS: Nasogastric tube extends into the distal stomach. There is contrast in the gastric lumen. There is no significant contrast in the small bowel or colon. Dilated stacked loops of small bowel persist in the mid abdomen, probably unchanged. No free air is evident on this single AP supine portable radiograph.  IMPRESSION: Enteric contrast has not reached the colon. Persistent small bowel dilatation consistent with small-bowel obstruction.   Electronically Signed   By: Ellery Plunk M.D.   On: 03/19/2015 01:48   Dg Abd Portable  1v-small Bowel Protocol-position Verification  03/18/2015   CLINICAL DATA:  Small bowel protocol, nasogastric tube placement  EXAM: PORTABLE ABDOMEN - 1 VIEW  COMPARISON:  Portable exam 1303 hours compared to CT abdomen and pelvis 03/18/2015  FINDINGS: Tip of nasogastric tube projects over distal gastric antrum or duodenal bulb, patient slightly rotated to the RIGHT.  Excreted contrast material within renal collecting systems and distended urinary bladder.  Air-filled loops of dilated small bowel  noted in the mid abdomen suspicious for small bowel obstruction.  Some gas and stool remain present within the colon.  Degenerative disc and facet disease changes thoracolumbar spine.  Osseous demineralization.  IMPRESSION: Tip of nasogastric tube projects over either the distal gastric antrum or the duodenal bulb region.  Dilated small bowel loops question small bowel obstruction.   Electronically Signed   By: Ulyses Southward M.D.   On: 03/18/2015 13:16    Anti-infectives: Anti-infectives    None      Assessment/Plan: s/p * No surgery found * Continue ng and bowel rest. Xrays may be a little better today  ambulate  LOS: 2 days    TOTH III,PAUL S 03/20/2015

## 2015-03-20 NOTE — Progress Notes (Signed)
0003 FSBS 60 Hyperglycemia protocol followed 1 amp D50 given recheck FSBS 133.

## 2015-03-20 NOTE — Progress Notes (Signed)
Initial Nutrition Assessment  DOCUMENTATION CODES:  Non-severe (moderate) malnutrition in context of chronic illness  INTERVENTION:  Diet advancement per MD If patient is to remain NPO > 7 days, consider nutrition support. RD to continue to monitor  NUTRITION DIAGNOSIS:  Malnutrition related to chronic illness as evidenced by moderate depletion of body fat, severe depletion of muscle mass.  GOAL:  Patient will meet greater than or equal to 90% of their needs  MONITOR:  Diet advancement, Labs, Weight trends, Skin, I & O's  REASON FOR ASSESSMENT:  Malnutrition Screening Tool    ASSESSMENT: 81 year old female with history of hypertension, coronary artery disease, hyperlipidemia, iron deficiency anemia, depression, osteoporosis, mild chronic kidney disease presented to the ED with ongoing nausea for 4 days and diffuse crampy lower abdominal pain since one day prior to admission. Associated poor by mouth intake and generalized weakness. Patient was found to have small bowel obstruction.  Pt reports feeling hungry, would like a big bowl of soup which is what she mainly was eating PTA. Pt reports her appetite was"terrible". Pt is currently NPO for bowel rest. Pt with NGT for suction.  Pt reports UBW of 220 lb, pt has lost 31 lb over the last year but is insignificant for time frame. Pt would like to try Glucerna shakes when diet is advanced.   Labs reviewed: Elevated BUN & Creatinine  Height:  Ht Readings from Last 1 Encounters:  03/18/15 5\' 4"  (1.626 m)    Weight:  Wt Readings from Last 1 Encounters:  03/18/15 141 lb (63.957 kg)    Ideal Body Weight:  54.5 kg  Wt Readings from Last 10 Encounters:  03/18/15 141 lb (63.957 kg)  12/31/14 145 lb (65.772 kg)  12/07/14 147 lb 8 oz (66.906 kg)  09/23/14 150 lb (68.04 kg)  08/19/14 150 lb (68.04 kg)  07/22/14 153 lb 12 oz (69.741 kg)  06/24/14 156 lb (70.761 kg)  05/26/14 162 lb (73.483 kg)  04/12/14 164 lb (74.39  kg)  03/14/14 172 lb (78.019 kg)    BMI:  Body mass index is 24.19 kg/(m^2).  Estimated Nutritional Needs:  Kcal:  1500-1700  Protein:  75-85g  Fluid:  1.5L/day     Skin:  Reviewed, no issues  Diet Order:  Diet NPO time specified  EDUCATION NEEDS:  No education needs identified at this time   Intake/Output Summary (Last 24 hours) at 03/20/15 1118 Last data filed at 03/20/15 0656  Gross per 24 hour  Intake 921.25 ml  Output   1300 ml  Net -378.75 ml    Last BM:  5/4  05/20/15, MS, RD, LDN Pager: [phone] After Hours Pager: [phone]

## 2015-03-20 NOTE — Progress Notes (Signed)
[facility] PROGRESS NOTE  [name] YQM:[id number] DOB: 1932-12-08 DOA: 03/18/2015 PCP: [name], MD  Brief narrative 81 year old female with history of hypertension, coronary artery disease, hyperlipidemia, iron deficiency anemia, depression, osteoporosis, mild chronic kidney disease presented to the ED with ongoing nausea for  4 days and diffuse crampy lower abdominal pain since one day prior to admission. Associated poor by mouth intake and generalized weakness. Patient was found to have small bowel obstruction and admitted to medical floor under observation.  Assessment/Plan: Active Problems:  Small bowel obstruction Secondary to adhesions.  Continue NG to wall suction. Partial small bowel obstruction persists on repeat x-ray today.  Pain control with when necessary morphine (frequency changed every 4 hours. Discontinue fentanyl) When necessary Zofran for nausea and vomiting. Surgery following. -Serial abdominal exam and follow-up x-ray in a.m.    Type 2 diabetes mellitus on sliding scale.   Coronary artery disease Stable. On ASA at home  Anxiety when necessary IV Ativan.  Chronic kidney disease Stable.  Elevated blood pressure Not on medications at home. Likely related to pain.  on when necessary IV hydralazine.     DVT prophylaxis: sq lovenox  Diet: Nothing by mouth  Code Status: Full code Family Communication: Daughter at bedside  Disposition Plan: Continue inpatient monitoring given ongoing small bowel obstruction.     Consultants:  CCS  Procedures:  None  Antibiotics:  None  HPI/Subjective: Patient seen and examined. Abdominal pain improved from yesterday.  Objective: Filed Vitals:   03/19/15 2022  BP: 160/82  Pulse: 124  Temp: 98.6 F (37 C)  Resp: 18    Intake/Output Summary (Last 24 hours) at 03/20/15 1212 Last data filed at 03/20/15 0842  Gross per 24 hour  Intake 921.25 ml  Output   1300 ml  Net -378.75 ml    Filed Weights   03/18/15 0700  Weight: 63.957 kg (141 lb)    Exam:   General:  Elderly female , in no acute distress  HEENT:  dry oral mucosa, NG in place draining biliary fluid, supple neck  Chest: Clear to auscultation bilaterally, no added sounds  CVS: Normal S1 and S2, no murmurs or gallop  GI: Soft, nondistended,, tender over supraumbilical area, bowel sounds present  Musculoskeletal: Warm, no edema  CNS: Alert and oriented   Data Reviewed: Basic Metabolic Panel:  Recent Labs Lab 03/18/15 0714 03/19/15 0552 03/20/15 0530  NA 135 141 144  K 4.1 4.3 4.4  CL 101 109 109  CO2 24 21* 24  GLUCOSE 138* 137* 94  BUN 20 32* 36*  CREATININE 1.06* 1.33* 1.09*  CALCIUM 10.1 8.5* 8.6*   Liver Function Tests:  Recent Labs Lab 03/18/15 0714  AST 26  ALT 13*  ALKPHOS 69  BILITOT 0.7  PROT 9.3*  ALBUMIN 4.7    Recent Labs Lab 03/18/15 0714  LIPASE 30   No results for input(s): AMMONIA in the last 168 hours. CBC:  Recent Labs Lab 03/18/15 0714 03/19/15 0552 03/20/15 0530  WBC 6.7 6.8 5.2  NEUTROABS 5.3  --   --   HGB 11.7* 11.3* 10.1*  HCT 36.0 35.2* 31.9*  MCV 92.5 92.6 93.5  PLT 352 361 344   Cardiac Enzymes: No results for input(s): CKTOTAL, CKMB, CKMBINDEX, TROPONINI in the last 168 hours. BNP (last 3 results) No results for input(s): BNP in the last 8760 hours.  ProBNP (last 3 results) No results for input(s): PROBNP in the last 8760 hours.  CBG:  Recent Labs  Lab 03/19/15 2020 03/20/15 0005 03/20/15 0106 03/20/15 0401 03/20/15 0805  GLUCAP 121* 60* 133* 82 101*    No results found for this or any previous visit (from the past 240 hour(s)).   Studies: Dg Abd Portable 1v  03/20/2015   CLINICAL DATA:  Followup small bowel obstruction.  EXAM: PORTABLE ABDOMEN - 1 VIEW  COMPARISON:  03/19/2015  FINDINGS: Mild small bowel dilation persists most evident in the left upper quadrant. There has been no substantial change from the prior  study. Residual contrast is noted in the bladder, also similar to the prior exam. Nasogastric tube tip projects in the distal stomach.  IMPRESSION: Small bowel dilation persists consistent with a persistent partial small bowel obstruction. No change from the previous day's study.   Electronically Signed   By: Amie Portland M.D.   On: 03/20/2015 07:29   Dg Abd Portable 1v  03/19/2015   CLINICAL DATA:  Small bowel obstruction protocol. Small bowel obstruction.  EXAM: PORTABLE ABDOMEN - 1 VIEW  COMPARISON:  03/18/2015.  FINDINGS: This is a 24 hr film. There is no contrast identified in the cecum. On the prior exam at 2046 hr yesterday, oral contrast was present in the stomach. Excreted contrast is present within the urinary bladder. Dilation of small bowel loops measures 36 mm. Cholecystectomy clips are present in the right upper quadrant.  There is less small bowel contrast than expected which may be due to dilution in the proximal small bowel. Nasogastric tube remains in the stomach with the tip at the antrum.  Large amount of stool is present in the colon.  IMPRESSION: Small bowel obstruction with no contrast identified in the cecum at 24 hr.   Electronically Signed   By: Andreas Newport M.D.   On: 03/19/2015 17:11   Dg Abd Portable 1v-small Bowel Obstruction Protocol-initial, 8 Hr Delay  03/19/2015   CLINICAL DATA:  Small bowel obstruction  EXAM: PORTABLE ABDOMEN - 1 VIEW  COMPARISON:  Radiographs and CT 03/18/2015  FINDINGS: Nasogastric tube extends into the distal stomach. There is contrast in the gastric lumen. There is no significant contrast in the small bowel or colon. Dilated stacked loops of small bowel persist in the mid abdomen, probably unchanged. No free air is evident on this single AP supine portable radiograph.  IMPRESSION: Enteric contrast has not reached the colon. Persistent small bowel dilatation consistent with small-bowel obstruction.   Electronically Signed   By: Ellery Plunk M.D.    On: 03/19/2015 01:48   Dg Abd Portable 1v-small Bowel Protocol-position Verification  03/18/2015   CLINICAL DATA:  Small bowel protocol, nasogastric tube placement  EXAM: PORTABLE ABDOMEN - 1 VIEW  COMPARISON:  Portable exam 1303 hours compared to CT abdomen and pelvis 03/18/2015  FINDINGS: Tip of nasogastric tube projects over distal gastric antrum or duodenal bulb, patient slightly rotated to the RIGHT.  Excreted contrast material within renal collecting systems and distended urinary bladder.  Air-filled loops of dilated small bowel noted in the mid abdomen suspicious for small bowel obstruction.  Some gas and stool remain present within the colon.  Degenerative disc and facet disease changes thoracolumbar spine.  Osseous demineralization.  IMPRESSION: Tip of nasogastric tube projects over either the distal gastric antrum or the duodenal bulb region.  Dilated small bowel loops question small bowel obstruction.   Electronically Signed   By: Ulyses Southward M.D.   On: 03/18/2015 13:16    Scheduled Meds: . clotrimazole  1 Applicatorful Vaginal QHS  .  enoxaparin (LOVENOX) injection  40 mg Subcutaneous Q24H  . insulin aspart  0-9 Units Subcutaneous 6 times per day   Continuous Infusions: . dextrose 5 % and 0.45% NaCl 75 mL/hr at 03/20/15 0841      Time spent: 25 minutes    Joliet Mallozzi  [facility] Pager [phone] If 7PM-7AM, please contact night-coverage at [email or web address], password [facility] 03/20/2015, 12:12 PM  LOS: 2 days

## 2015-03-21 ENCOUNTER — Inpatient Hospital Stay (HOSPITAL_COMMUNITY): Payer: Commercial Managed Care - HMO

## 2015-03-21 DIAGNOSIS — R410 Disorientation, unspecified: Secondary | ICD-10-CM

## 2015-03-21 LAB — GLUCOSE, CAPILLARY
GLUCOSE-CAPILLARY: 101 mg/dL — AB (ref 70–99)
GLUCOSE-CAPILLARY: 118 mg/dL — AB (ref 70–99)
Glucose-Capillary: 101 mg/dL — ABNORMAL HIGH (ref 70–99)
Glucose-Capillary: 112 mg/dL — ABNORMAL HIGH (ref 70–99)
Glucose-Capillary: 141 mg/dL — ABNORMAL HIGH (ref 70–99)
Glucose-Capillary: 146 mg/dL — ABNORMAL HIGH (ref 70–99)

## 2015-03-21 MED ORDER — SORBITOL 70 % SOLN
960.0000 mL | TOPICAL_OIL | Freq: Once | ORAL | Status: AC
  Administered 2015-03-21: 960 mL via RECTAL
  Filled 2015-03-21: qty 240

## 2015-03-21 MED ORDER — MORPHINE SULFATE 2 MG/ML IJ SOLN: 1.0000 mg | INTRAMUSCULAR | Status: DC | PRN

## 2015-03-21 MED ORDER — LORAZEPAM 2 MG/ML IJ SOLN
0.5000 mg | Freq: Three times a day (TID) | INTRAMUSCULAR | Status: DC | PRN
  Administered 2015-03-22: 0.5 mg via INTRAVENOUS
  Filled 2015-03-21: qty 1

## 2015-03-21 NOTE — Progress Notes (Signed)
Patient found, with ng tube taken out, and IV taken out.  Patient allowed new iv to be placed, but would not cooperate with ng tube placement.  Patient states that she feels better without it, but seems to be somewhat confused.  Able to express that she is at [facility], but could not recall the date or president.

## 2015-03-21 NOTE — Progress Notes (Signed)
[facility] PROGRESS NOTE  [name] UXL:[id number] DOB: [birth year] DOA: 03/18/2015 PCP: [name], MD  Brief narrative 81 year old female with history of hypertension, coronary artery disease, hyperlipidemia, iron deficiency anemia, depression, osteoporosis, mild chronic kidney disease presented to the ED with ongoing nausea for  4 days and diffuse crampy lower abdominal pain since one day prior to admission. Associated poor by mouth intake and generalized weakness. Patient was found to have small bowel obstruction and admitted to medical floor under observation.  Assessment/Plan: Active Problems:  Small bowel obstruction Secondary to adhesions.  Patient pulled her NG output. Follow-up x-ray showing improvement. Surgery recommended to monitor of NG and give SMOG edema given high stool burden in her colon. Minimize narcotics as patient more lethargic. Continue when necessary Zofran for nausea and vomiting.  Acute Delirium Minimize narcotic and benzodiazepine.  Coronary artery disease Stable.  Chronic kidney disease. Stable  Elevated blood pressure Secondary to pain. Not on any medications. Continue when necessary hydralazine.    Type 2 diabetes mellitus on sliding scale.        DVT prophylaxis: sq lovenox  Diet: Nothing by mouth  Code Status: Full code Family Communication: Husband at bedside  Disposition Plan: Continue inpatient monitoring given ongoing small bowel obstruction. Home once bowel obstruction corrected.    Consultants:  CCS  Procedures:  None  Antibiotics:  None  HPI/Subjective: Patient seen and examined. Appeared confused and pulled her NG tube out and refused to have it replaced. Found to be tachycardic this morning. No other issues overnight.  Objective: Filed Vitals:   03/21/15 1348  BP: 158/75  Pulse: 110  Temp: 97.2 F (36.2 C)  Resp: 16    Intake/Output Summary (Last 24 hours) at 03/21/15 1411 Last data  filed at 03/21/15 0400  Gross per 24 hour  Intake 1448.75 ml  Output   1300 ml  Net 148.75 ml   Filed Weights   03/18/15 0700  Weight: 63.957 kg (141 lb)    Exam:   General:  Elderly female , lethargic and sleepy.  HEENT:  dry oral mucosa, NG pulled out. Supple neck  Chest: Clear to auscultation bilaterally, no added sounds  CVS: Normal S1 and S2, no murmurs or gallop  GI: Soft, nondistended,, tender over supraumbilical area, bowel sounds present  Musculoskeletal: Warm, no edema  CNS: Sleepy but arousable and answering questions.   Data Reviewed: Basic Metabolic Panel:  Recent Labs Lab 03/18/15 0714 03/19/15 0552 03/20/15 0530  NA 135 141 144  K 4.1 4.3 4.4  CL 101 109 109  CO2 24 21* 24  GLUCOSE 138* 137* 94  BUN 20 32* 36*  CREATININE 1.06* 1.33* 1.09*  CALCIUM 10.1 8.5* 8.6*   Liver Function Tests:  Recent Labs Lab 03/18/15 0714  AST 26  ALT 13*  ALKPHOS 69  BILITOT 0.7  PROT 9.3*  ALBUMIN 4.7    Recent Labs Lab 03/18/15 0714  LIPASE 30   No results for input(s): AMMONIA in the last 168 hours. CBC:  Recent Labs Lab 03/18/15 0714 03/19/15 0552 03/20/15 0530  WBC 6.7 6.8 5.2  NEUTROABS 5.3  --   --   HGB 11.7* 11.3* 10.1*  HCT 36.0 35.2* 31.9*  MCV 92.5 92.6 93.5  PLT 352 361 344   Cardiac Enzymes: No results for input(s): CKTOTAL, CKMB, CKMBINDEX, TROPONINI in the last 168 hours. BNP (last 3 results) No results for input(s): BNP in the last 8760 hours.  ProBNP (last 3 results) No results for  input(s): PROBNP in the last 8760 hours.  CBG:  Recent Labs Lab 03/20/15 1645 03/20/15 2035 03/20/15 2351 03/21/15 0413 03/21/15 1147  GLUCAP 98 149* 118* 146* 101*    No results found for this or any previous visit (from the past 240 hour(s)).   Studies: Dg Abd Portable 1v  03/21/2015   CLINICAL DATA:  Small-bowel obstruction .  EXAM: PORTABLE ABDOMEN - 1 VIEW  COMPARISON:  03/20/2015 .  FINDINGS: NG tube noted with tip in  the upper portion stomach. Slight advancement should be considered . Surgical clips right upper quadrant Soft tissue structures are unremarkable. Small-bowel distention has improved. No free air. Stool noted throughout the colon. No acute bony abnormality.  IMPRESSION: 1. NG tube noted with tip in the upper portion of the stomach. Advancement suggested.  2. Interim improvement of small bowel distention. Colonic gas pattern is normal with stool throughout the colon.  These results will be called to the ordering clinician or representative by the Radiologist Assistant, and communication documented in the PACS or zVision Dashboard.   Electronically Signed   By: Maisie Fus  Register   On: 03/21/2015 07:32   Dg Abd Portable 1v  03/20/2015   CLINICAL DATA:  Followup small bowel obstruction.  EXAM: PORTABLE ABDOMEN - 1 VIEW  COMPARISON:  03/19/2015  FINDINGS: Mild small bowel dilation persists most evident in the left upper quadrant. There has been no substantial change from the prior study. Residual contrast is noted in the bladder, also similar to the prior exam. Nasogastric tube tip projects in the distal stomach.  IMPRESSION: Small bowel dilation persists consistent with a persistent partial small bowel obstruction. No change from the previous day's study.   Electronically Signed   By: Amie Portland M.D.   On: 03/20/2015 07:29   Dg Abd Portable 1v  03/19/2015   CLINICAL DATA:  Small bowel obstruction protocol. Small bowel obstruction.  EXAM: PORTABLE ABDOMEN - 1 VIEW  COMPARISON:  03/18/2015.  FINDINGS: This is a 24 hr film. There is no contrast identified in the cecum. On the prior exam at 2046 hr yesterday, oral contrast was present in the stomach. Excreted contrast is present within the urinary bladder. Dilation of small bowel loops measures 36 mm. Cholecystectomy clips are present in the right upper quadrant.  There is less small bowel contrast than expected which may be due to dilution in the proximal small bowel.  Nasogastric tube remains in the stomach with the tip at the antrum.  Large amount of stool is present in the colon.  IMPRESSION: Small bowel obstruction with no contrast identified in the cecum at 24 hr.   Electronically Signed   By: Andreas Newport M.D.   On: 03/19/2015 17:11    Scheduled Meds: . clotrimazole  1 Applicatorful Vaginal QHS  . enoxaparin (LOVENOX) injection  40 mg Subcutaneous Q24H  . insulin aspart  0-9 Units Subcutaneous 6 times per day  . sorbitol, milk of mag, mineral oil, glycerin (SMOG) enema  960 mL Rectal Once   Continuous Infusions: . dextrose 5 % and 0.45% NaCl 75 mL/hr at 03/20/15 2324      Time spent: 25 minutes    Kreg Earhart  [facility] Pager [phone] If 7PM-7AM, please contact night-coverage at [email or web address], password [facility] 03/21/2015, 2:11 PM  LOS: 3 days

## 2015-03-21 NOTE — Progress Notes (Signed)
Patient had minimal results post smog enema.  Patient had IV infiltrate in R upper arm.  R upper arm is swollen.  IV team to look for new IV.

## 2015-03-21 NOTE — Progress Notes (Signed)
IV team unable to find IV.  IV team suggesting PICC line if needed.

## 2015-03-21 NOTE — Progress Notes (Signed)
Patient ID: [name], female   DOB: Aug 20, 1933, 81 y.o.   MRN: [id number]    Subjective: Patient very somnolent due to meds trying to get NGT back in as it was pulled out.  She can't provide much info  Objective: Vital signs in last 24 hours: Temp:  [98.4 F (36.9 C)-99.3 F (37.4 C)] 99.1 F (37.3 C) (05/09 1058) Pulse Rate:  [107-121] 121 (05/09 1058) Resp:  [18-19] 18 (05/09 0600) BP: (149-158)/(72-87) 153/87 mmHg (05/09 1058) SpO2:  [96 %-100 %] 96 % (05/09 1058) Last BM Date: 03/16/15 (per dtr)  Intake/Output from previous day: 05/08 0701 - 05/09 0700 In: 1448.8 [I.V.:1448.8] Out: 1300 [Emesis/NG output:1300] Intake/Output this shift:    PE: Abd: soft, minimally tender, some BS, obese Heart: tachy Lungs: CTAB  Lab Results:   Recent Labs  03/19/15 0552 03/20/15 0530  WBC 6.8 5.2  HGB 11.3* 10.1*  HCT 35.2* 31.9*  PLT 361 344   BMET  Recent Labs  03/19/15 0552 03/20/15 0530  NA 141 144  K 4.3 4.4  CL 109 109  CO2 21* 24  GLUCOSE 137* 94  BUN 32* 36*  CREATININE 1.33* 1.09*  CALCIUM 8.5* 8.6*   PT/INR No results for input(s): LABPROT, INR in the last 72 hours. CMP     Component Value Date/Time   NA 144 03/20/2015 0530   K 4.4 03/20/2015 0530   CL 109 03/20/2015 0530   CO2 24 03/20/2015 0530   GLUCOSE 94 03/20/2015 0530   BUN 36* 03/20/2015 0530   CREATININE 1.09* 03/20/2015 0530   CALCIUM 8.6* 03/20/2015 0530   PROT 9.3* 03/18/2015 0714   ALBUMIN 4.7 03/18/2015 0714   AST 26 03/18/2015 0714   ALT 13* 03/18/2015 0714   ALKPHOS 69 03/18/2015 0714   BILITOT 0.7 03/18/2015 0714   GFRNONAA 47* 03/20/2015 0530   GFRAA 54* 03/20/2015 0530   Lipase     Component Value Date/Time   LIPASE 30 03/18/2015 0714       Studies/Results: Dg Abd Portable 1v  03/21/2015   CLINICAL DATA:  Small-bowel obstruction .  EXAM: PORTABLE ABDOMEN - 1 VIEW  COMPARISON:  03/20/2015 .  FINDINGS: NG tube noted with tip in the upper portion stomach. Slight  advancement should be considered . Surgical clips right upper quadrant Soft tissue structures are unremarkable. Small-bowel distention has improved. No free air. Stool noted throughout the colon. No acute bony abnormality.  IMPRESSION: 1. NG tube noted with tip in the upper portion of the stomach. Advancement suggested.  2. Interim improvement of small bowel distention. Colonic gas pattern is normal with stool throughout the colon.  These results will be called to the ordering clinician or representative by the Radiologist Assistant, and communication documented in the PACS or zVision Dashboard.   Electronically Signed   By: Maisie Fus  Register   On: 03/21/2015 07:32   Dg Abd Portable 1v  03/20/2015   CLINICAL DATA:  Followup small bowel obstruction.  EXAM: PORTABLE ABDOMEN - 1 VIEW  COMPARISON:  03/19/2015  FINDINGS: Mild small bowel dilation persists most evident in the left upper quadrant. There has been no substantial change from the prior study. Residual contrast is noted in the bladder, also similar to the prior exam. Nasogastric tube tip projects in the distal stomach.  IMPRESSION: Small bowel dilation persists consistent with a persistent partial small bowel obstruction. No change from the previous day's study.   Electronically Signed   By: Renard Hamper.D.  On: 03/20/2015 07:29   Dg Abd Portable 1v  03/19/2015   CLINICAL DATA:  Small bowel obstruction protocol. Small bowel obstruction.  EXAM: PORTABLE ABDOMEN - 1 VIEW  COMPARISON:  03/18/2015.  FINDINGS: This is a 24 hr film. There is no contrast identified in the cecum. On the prior exam at 2046 hr yesterday, oral contrast was present in the stomach. Excreted contrast is present within the urinary bladder. Dilation of small bowel loops measures 36 mm. Cholecystectomy clips are present in the right upper quadrant.  There is less small bowel contrast than expected which may be due to dilution in the proximal small bowel. Nasogastric tube remains in the  stomach with the tip at the antrum.  Large amount of stool is present in the colon.  IMPRESSION: Small bowel obstruction with no contrast identified in the cecum at 24 hr.   Electronically Signed   By: Andreas Newport M.D.   On: 03/19/2015 17:11    Anti-infectives: Anti-infectives    None       Assessment/Plan   SBO -NGT came out.  Abdominal films seem improved.  She has a bunch of stool in her colon.  Will leave NGT and give SMOG enema to see if this will help get things moving. -NPO still except ice chips.  See how she does with NGT out.  Hopefully can advance her diet tomorrow. -will follow   LOS: 3 days    [name] 03/21/2015, 1:01 PM Pager: [phone]

## 2015-03-21 NOTE — Care Management Note (Signed)
Case Management Note  Patient Details  Name: [name] MRN: [id number] Date of Birth: 06/29/1933  Subjective/Objective:     81 yo female admitted with SBO from home               Action/Plan:  Discharge planning. Patient lives at home independently with her spouse, who is blind and the support of her children. Her daughter stated that she works as a Engineer, agricultural and is going to put her services in place for her parents. She is also interested in [facility] services for the patient upon discharge if appropriate. Will continue to follow for discharge needs. Expected Discharge Date:   (unknown)               Expected Discharge Plan:  Home w Home Health Services  In-House Referral:     Discharge planning Services  CM Consult  Post Acute Care Choice:  Home Health Choice offered to:  Adult Children  DME Arranged:    DME Agency:     HH Arranged:    HH Agency:     Status of Service:  In process, will continue to follow  Medicare Important Message Given:  Yes Date Medicare IM Given:  03/21/15 Medicare IM give by:  Aileen Pilot Date Additional Medicare IM Given:    Additional Medicare Important Message give by:     If discussed at Long Length of Stay Meetings, dates discussed:    Additional Comments:  Gerrit Halls, RN 03/21/2015, 2:24 PM

## 2015-03-22 ENCOUNTER — Inpatient Hospital Stay (HOSPITAL_COMMUNITY): Payer: Commercial Managed Care - HMO

## 2015-03-22 DIAGNOSIS — M79671 Pain in right foot: Secondary | ICD-10-CM

## 2015-03-22 LAB — GLUCOSE, CAPILLARY
Glucose-Capillary: 157 mg/dL — ABNORMAL HIGH (ref 70–99)
Glucose-Capillary: 118 mg/dL — ABNORMAL HIGH (ref 70–99)
Glucose-Capillary: 124 mg/dL — ABNORMAL HIGH (ref 70–99)
Glucose-Capillary: 91 mg/dL (ref 70–99)
Glucose-Capillary: 144 mg/dL — ABNORMAL HIGH (ref 70–99)

## 2015-03-22 LAB — URIC ACID: URIC ACID, SERUM: 8 mg/dL — AB (ref 2.3–6.6)

## 2015-03-22 MED ORDER — NAPROXEN 375 MG PO TABS
375.0000 mg | ORAL_TABLET | Freq: Two times a day (BID) | ORAL | Status: DC
  Administered 2015-03-22 – 2015-03-23 (×2): 375 mg via ORAL
  Filled 2015-03-22 (×6): qty 1

## 2015-03-22 MED ORDER — BISACODYL 10 MG RE SUPP
10.0000 mg | Freq: Every day | RECTAL | Status: DC
  Administered 2015-03-22 – 2015-03-23 (×2): 10 mg via RECTAL
  Filled 2015-03-22 (×2): qty 1

## 2015-03-22 NOTE — Progress Notes (Signed)
[facility] PROGRESS NOTE  [name] NIO:[id number] DOB: 1933/01/25 DOA: 03/18/2015 PCP: [name], MD  Brief narrative 81 year old female with history of hypertension, coronary artery disease, hyperlipidemia, iron deficiency anemia, depression, osteoporosis, mild chronic kidney disease presented to the ED with ongoing nausea for  4 days and diffuse crampy lower abdominal pain since one day prior to admission. Associated poor by mouth intake and generalized weakness. Patient was found to have small bowel obstruction and admitted to medical floor under observation.  Assessment/Plan: Active Problems:  Small bowel obstruction Secondary to adhesions.  Slowly improving. NG pout. Started on clears. High stool burden. Given enema with little response. Added suppository. Serial abd exam. CCS following.   Rt foot pain  no swelling but significant tenderness below the ankle and dorsum. No fall or injury.  Check x ray. Uric acid elevated ? Acute gouty arthritis. Will place on naprosyn bid.   Acute Delirium Minimize narcotic and benzodiazepine.  Coronary artery disease Stable.  Chronic kidney disease.  Stable  Elevated blood pressure Secondary to pain. Not on any medications. Continue when necessary hydralazine.    Type 2 diabetes mellitus on sliding scale.        DVT prophylaxis: sq lovenox  Diet: clear liquid   Code Status: Full code Family Communication: daughter at bedside  Disposition Plan: Continue inpatient monitoring given ongoing small bowel obstruction. Home once bowel obstruction corrected. Possibly in next 2-3 days    Consultants:  CCS  Procedures:  None  Antibiotics:  None  HPI/Subjective: Patient seen and examined. Appeared confused and pulled her NG tube out and refused to have it replaced. Found to be tachycardic this morning. No other issues overnight.  Objective: Filed Vitals:   03/22/15 0415  BP: 135/70  Pulse: 111   Temp: 99.1 F (37.3 C)  Resp: 20    Intake/Output Summary (Last 24 hours) at 03/22/15 1451 Last data filed at 03/22/15 1300  Gross per 24 hour  Intake   2355 ml  Output      0 ml  Net   2355 ml   Filed Weights   03/18/15 0700  Weight: 63.957 kg (141 lb)    Exam:   General:  Elderly female fatigued in NAD  HEENT:  dry oral mucosa,  Supple neck  Chest: Clear to auscultation bilaterally, no added sounds  CVS: Normal S1 and S2, no murmurs or gallop  GI: Soft, nondistended,, minimally tender supraumbilical area, bowel sounds present  Musculoskeletal: Warm, no edema, tender rt foot  CNS: alert and oriented    Data Reviewed: Basic Metabolic Panel:  Recent Labs Lab 03/18/15 0714 03/19/15 0552 03/20/15 0530  NA 135 141 144  K 4.1 4.3 4.4  CL 101 109 109  CO2 24 21* 24  GLUCOSE 138* 137* 94  BUN 20 32* 36*  CREATININE 1.06* 1.33* 1.09*  CALCIUM 10.1 8.5* 8.6*   Liver Function Tests:  Recent Labs Lab 03/18/15 0714  AST 26  ALT 13*  ALKPHOS 69  BILITOT 0.7  PROT 9.3*  ALBUMIN 4.7    Recent Labs Lab 03/18/15 0714  LIPASE 30   No results for input(s): AMMONIA in the last 168 hours. CBC:  Recent Labs Lab 03/18/15 0714 03/19/15 0552 03/20/15 0530  WBC 6.7 6.8 5.2  NEUTROABS 5.3  --   --   HGB 11.7* 11.3* 10.1*  HCT 36.0 35.2* 31.9*  MCV 92.5 92.6 93.5  PLT 352 361 344   Cardiac Enzymes: No results for input(s): CKTOTAL,  CKMB, CKMBINDEX, TROPONINI in the last 168 hours. BNP (last 3 results) No results for input(s): BNP in the last 8760 hours.  ProBNP (last 3 results) No results for input(s): PROBNP in the last 8760 hours.  CBG:  Recent Labs Lab 03/21/15 1959 03/21/15 2343 03/22/15 0413 03/22/15 0736 03/22/15 1144  GLUCAP 101* 112* 157* 118* 124*    No results found for this or any previous visit (from the past 240 hour(s)).   Studies: Dg Abd Portable 1v  03/22/2015   CLINICAL DATA:  81 year old female with recent small  bowel obstruction. Initial encounter.  EXAM: PORTABLE ABDOMEN - 1 VIEW  COMPARISON:  03/21/2015 and earlier, including CT Abdomen and Pelvis 03/18/2015.  FINDINGS: Portable AP supine view at 0517 hrs. Enteric tube has been removed. Recurrent dilated gas-filled small bowel loops in the mid abdomen up to 41 mm diameter. Abundant stool now at the splenic flexure. Decreased distal colon gas in the pelvis. Stable cholecystectomy clips. Stable abdominal and pelvic visceral contours. Grossly stable lung bases. Stable visualized osseous structures.  IMPRESSION: NG tube removed with recurrent small bowel obstruction.   Electronically Signed   By: Odessa Fleming M.D.   On: 03/22/2015 07:42   Dg Abd Portable 1v  03/21/2015   CLINICAL DATA:  Small-bowel obstruction .  EXAM: PORTABLE ABDOMEN - 1 VIEW  COMPARISON:  03/20/2015 .  FINDINGS: NG tube noted with tip in the upper portion stomach. Slight advancement should be considered . Surgical clips right upper quadrant Soft tissue structures are unremarkable. Small-bowel distention has improved. No free air. Stool noted throughout the colon. No acute bony abnormality.  IMPRESSION: 1. NG tube noted with tip in the upper portion of the stomach. Advancement suggested.  2. Interim improvement of small bowel distention. Colonic gas pattern is normal with stool throughout the colon.  These results will be called to the ordering clinician or representative by the Radiologist Assistant, and communication documented in the PACS or zVision Dashboard.   Electronically Signed   By: Maisie Fus  Register   On: 03/21/2015 07:32    Scheduled Meds: . bisacodyl  10 mg Rectal Daily  . clotrimazole  1 Applicatorful Vaginal QHS  . enoxaparin (LOVENOX) injection  40 mg Subcutaneous Q24H  . insulin aspart  0-9 Units Subcutaneous 6 times per day   Continuous Infusions: . dextrose 5 % and 0.45% NaCl 75 mL/hr at 03/21/15 1429      Time spent: 25 minutes    Zhamir Pirro  [facility] Pager [phone] If 7PM-7AM, please contact night-coverage at [email or web address], password [facility] 03/22/2015, 2:51 PM  LOS: 4 days

## 2015-03-22 NOTE — Progress Notes (Addendum)
Patient had medium sized formed bowel movement after suppository.  Earlier in the day patient had a reported small bowel movement as well.

## 2015-03-22 NOTE — Progress Notes (Signed)
Patient ID: [name], female   DOB: August 26, 1933, 81 y.o.   MRN: [id number]     [facility]      [address] [place]   [place], [place] [address]    Phone: [phone] FAX: [phone]     Subjective: C/o right foot pain, acute, no falls.   No abdominal pain, nausea or vomiting.  NGT came out.    Objective:  Vital signs:  Filed Vitals:   03/21/15 1058 03/21/15 1348 03/21/15 2045 03/22/15 0415  BP: 153/87 158/75 145/74 135/70  Pulse: 121 110 115 111  Temp: 99.1 F (37.3 C) 97.2 F (36.2 C) 98.6 F (37 C) 99.1 F (37.3 C)  TempSrc: Oral Axillary Oral Oral  Resp:  16 22 20   Height:      Weight:      SpO2: 96% 95% 98% 98%    Last BM Date: 03/16/15 (per dtr)  Intake/Output   Yesterday:  05/09 0701 - 05/10 0700 In: 1875 [I.V.:1875] Out: -  This shift:    I/O last 3 completed shifts: In: 2625 [I.V.:2625] Out: 300 [Emesis/NG output:300]       Physical Exam: General: Pt awake/alert/oriented x4 in no acute distress Abdomen: Soft.  Nondistended.   Non tender.  No evidence of peritonitis.  No incarcerated hernias.    Problem List:   Principal Problem:   Small bowel obstruction Active Problems:   Diabetes type 2, controlled   Adjustment disorder with mixed anxiety and depressed mood   Coronary atherosclerosis   Abdominal pain   Knee pain, bilateral   Elevated blood pressure   Malnutrition of moderate degree    Results:   Labs: Results for orders placed or performed during the hospital encounter of 03/18/15 (from the past 48 hour(s))  Glucose, capillary     Status: Abnormal   Collection Time: 03/20/15 12:39 PM  Result Value Ref Range   Glucose-Capillary 142 (H) 70 - 99 mg/dL  Glucose, capillary     Status: None   Collection Time: 03/20/15  4:45 PM  Result Value Ref Range   Glucose-Capillary 98 70 - 99 mg/dL  Glucose, capillary     Status: Abnormal   Collection Time: 03/20/15  8:35 PM  Result Value Ref  Range   Glucose-Capillary 149 (H) 70 - 99 mg/dL  Glucose, capillary     Status: Abnormal   Collection Time: 03/20/15 11:51 PM  Result Value Ref Range   Glucose-Capillary 118 (H) 70 - 99 mg/dL   Comment 1 Notify RN   Glucose, capillary     Status: Abnormal   Collection Time: 03/21/15  4:13 AM  Result Value Ref Range   Glucose-Capillary 146 (H) 70 - 99 mg/dL   Comment 1 Notify RN   Glucose, capillary     Status: Abnormal   Collection Time: 03/21/15 11:47 AM  Result Value Ref Range   Glucose-Capillary 101 (H) 70 - 99 mg/dL  Glucose, capillary     Status: Abnormal   Collection Time: 03/21/15  4:57 PM  Result Value Ref Range   Glucose-Capillary 141 (H) 70 - 99 mg/dL  Glucose, capillary     Status: Abnormal   Collection Time: 03/21/15  7:59 PM  Result Value Ref Range   Glucose-Capillary 101 (H) 70 - 99 mg/dL  Glucose, capillary     Status: Abnormal   Collection Time: 03/21/15 11:43 PM  Result Value Ref Range   Glucose-Capillary 112 (H) 70 - 99 mg/dL  Glucose, capillary  Status: Abnormal   Collection Time: 03/22/15  4:13 AM  Result Value Ref Range   Glucose-Capillary 157 (H) 70 - 99 mg/dL  Glucose, capillary     Status: Abnormal   Collection Time: 03/22/15  7:36 AM  Result Value Ref Range   Glucose-Capillary 118 (H) 70 - 99 mg/dL    Imaging / Studies: Dg Abd Portable 1v  03/22/2015   CLINICAL DATA:  81 year old female with recent small bowel obstruction. Initial encounter.  EXAM: PORTABLE ABDOMEN - 1 VIEW  COMPARISON:  03/21/2015 and earlier, including CT Abdomen and Pelvis 03/18/2015.  FINDINGS: Portable AP supine view at 0517 hrs. Enteric tube has been removed. Recurrent dilated gas-filled small bowel loops in the mid abdomen up to 41 mm diameter. Abundant stool now at the splenic flexure. Decreased distal colon gas in the pelvis. Stable cholecystectomy clips. Stable abdominal and pelvic visceral contours. Grossly stable lung bases. Stable visualized osseous structures.   IMPRESSION: NG tube removed with recurrent small bowel obstruction.   Electronically Signed   By: Odessa Fleming M.D.   On: 03/22/2015 07:42   Dg Abd Portable 1v  03/21/2015   CLINICAL DATA:  Small-bowel obstruction .  EXAM: PORTABLE ABDOMEN - 1 VIEW  COMPARISON:  03/20/2015 .  FINDINGS: NG tube noted with tip in the upper portion stomach. Slight advancement should be considered . Surgical clips right upper quadrant Soft tissue structures are unremarkable. Small-bowel distention has improved. No free air. Stool noted throughout the colon. No acute bony abnormality.  IMPRESSION: 1. NG tube noted with tip in the upper portion of the stomach. Advancement suggested.  2. Interim improvement of small bowel distention. Colonic gas pattern is normal with stool throughout the colon.  These results will be called to the ordering clinician or representative by the Radiologist Assistant, and communication documented in the PACS or zVision Dashboard.   Electronically Signed   By: Maisie Fus  Register   On: 03/21/2015 07:32    Medications / Allergies:  Scheduled Meds: . bisacodyl  10 mg Rectal Daily  . clotrimazole  1 Applicatorful Vaginal QHS  . enoxaparin (LOVENOX) injection  40 mg Subcutaneous Q24H  . insulin aspart  0-9 Units Subcutaneous 6 times per day   Continuous Infusions: . dextrose 5 % and 0.45% NaCl 75 mL/hr at 03/21/15 1429   PRN Meds:.acetaminophen **OR** acetaminophen, hydrALAZINE, LORazepam, morphine injection, ondansetron **OR** ondansetron (ZOFRAN) IV, phenol, polyvinyl alcohol  Antibiotics: Anti-infectives    None        Assessment/Plan HD#4 SBO -films improved, but still has a lot of stool.  Minimal success with the SMOG enema.  Will give dulcolax suppository, allow for clears. -mobilize -repeat electrolytes VTE prophylaxis-SCD/lovenox FEN-allow clears   Ashok Norris, ANP-BC [facility] Pager [phone](7A-4:30P)   03/22/2015 9:59 AM

## 2015-03-23 ENCOUNTER — Inpatient Hospital Stay (HOSPITAL_COMMUNITY): Payer: Commercial Managed Care - HMO

## 2015-03-23 DIAGNOSIS — I10 Essential (primary) hypertension: Secondary | ICD-10-CM

## 2015-03-23 DIAGNOSIS — Z01810 Encounter for preprocedural cardiovascular examination: Secondary | ICD-10-CM

## 2015-03-23 DIAGNOSIS — I2510 Atherosclerotic heart disease of native coronary artery without angina pectoris: Secondary | ICD-10-CM

## 2015-03-23 LAB — CBC WITH DIFFERENTIAL/PLATELET
Basophils Absolute: 0 10*3/uL (ref 0.0–0.1)
Basophils Relative: 0 % (ref 0–1)
Eosinophils Absolute: 0 10*3/uL (ref 0.0–0.7)
Eosinophils Relative: 1 % (ref 0–5)
HEMATOCRIT: 35.9 % — AB (ref 36.0–46.0)
Hemoglobin: 11.2 g/dL — ABNORMAL LOW (ref 12.0–15.0)
LYMPHS ABS: 1.1 10*3/uL (ref 0.7–4.0)
LYMPHS PCT: 14 % (ref 12–46)
MCH: 29.4 pg (ref 26.0–34.0)
MCHC: 31.2 g/dL (ref 30.0–36.0)
MCV: 94.2 fL (ref 78.0–100.0)
MONOS PCT: 9 % (ref 3–12)
Monocytes Absolute: 0.7 10*3/uL (ref 0.1–1.0)
NEUTROS PCT: 76 % (ref 43–77)
Neutro Abs: 5.7 10*3/uL (ref 1.7–7.7)
Platelets: 309 10*3/uL (ref 150–400)
RBC: 3.81 MIL/uL — AB (ref 3.87–5.11)
RDW: 12.7 % (ref 11.5–15.5)
WBC: 7.5 10*3/uL (ref 4.0–10.5)

## 2015-03-23 LAB — GLUCOSE, CAPILLARY
GLUCOSE-CAPILLARY: 125 mg/dL — AB (ref 70–99)
GLUCOSE-CAPILLARY: 156 mg/dL — AB (ref 70–99)
Glucose-Capillary: 111 mg/dL — ABNORMAL HIGH (ref 70–99)
Glucose-Capillary: 129 mg/dL — ABNORMAL HIGH (ref 70–99)
Glucose-Capillary: 130 mg/dL — ABNORMAL HIGH (ref 70–99)
Glucose-Capillary: 175 mg/dL — ABNORMAL HIGH (ref 70–99)

## 2015-03-23 LAB — BASIC METABOLIC PANEL
Anion gap: 13 (ref 5–15)
BUN: 21 mg/dL — ABNORMAL HIGH (ref 6–20)
CALCIUM: 8.7 mg/dL — AB (ref 8.9–10.3)
CO2: 22 mmol/L (ref 22–32)
Chloride: 102 mmol/L (ref 101–111)
Creatinine, Ser: 0.87 mg/dL (ref 0.44–1.00)
GFR calc non Af Amer: >60 mL/min (ref >60)
GLUCOSE: 137 mg/dL — AB (ref 70–99)
POTASSIUM: 3.9 mmol/L (ref 3.5–5.1)
Sodium: 137 mmol/L (ref 135–145)

## 2015-03-23 MED ORDER — DEXTROSE-NACL 5-0.45 % IV SOLN: INTRAVENOUS | Status: DC

## 2015-03-23 MED ORDER — CALCIUM CARBONATE ANTACID 500 MG PO CHEW
400.0000 mg | CHEWABLE_TABLET | Freq: Three times a day (TID) | ORAL | Status: DC | PRN
  Administered 2015-03-23: 400 mg via ORAL
  Filled 2015-03-23: qty 2

## 2015-03-23 MED ORDER — MORPHINE SULFATE 2 MG/ML IJ SOLN
1.0000 mg | Freq: Four times a day (QID) | INTRAMUSCULAR | Status: DC | PRN
  Administered 2015-03-23: 1 mg via INTRAVENOUS
  Filled 2015-03-23 (×3): qty 1

## 2015-03-23 MED ORDER — CLOTRIMAZOLE 1 % VA CREA
1.0000 | TOPICAL_CREAM | Freq: Every morning | VAGINAL | Status: DC
  Filled 2015-03-23: qty 45

## 2015-03-23 MED ORDER — DEXTROSE-NACL 5-0.45 % IV SOLN
INTRAVENOUS | Status: DC
  Administered 2015-03-23: 18:00:00 via INTRAVENOUS

## 2015-03-23 MED ORDER — TRACE MINERALS CR-CU-F-FE-I-MN-MO-SE-ZN IV SOLN
INTRAVENOUS | Status: DC
  Administered 2015-03-23: 20:00:00 via INTRAVENOUS
  Filled 2015-03-23: qty 840

## 2015-03-23 MED ORDER — SODIUM CHLORIDE 0.9 % IJ SOLN
10.0000 mL | INTRAMUSCULAR | Status: DC | PRN
  Administered 2015-03-24: 10 mL
  Filled 2015-03-23: qty 40

## 2015-03-23 MED ORDER — FAT EMULSION 20 % IV EMUL
240.0000 mL | INTRAVENOUS | Status: DC
  Administered 2015-03-23: 240 mL via INTRAVENOUS
  Filled 2015-03-23: qty 250

## 2015-03-23 MED ORDER — HYDRALAZINE HCL 20 MG/ML IJ SOLN
5.0000 mg | Freq: Three times a day (TID) | INTRAMUSCULAR | Status: DC
  Administered 2015-03-23 – 2015-03-24 (×2): 5 mg via INTRAVENOUS
  Filled 2015-03-23 (×2): qty 1
  Filled 2015-03-23 (×3): qty 0.25

## 2015-03-23 NOTE — Progress Notes (Addendum)
Patient ID: [name], female   DOB: [birth year], 81 y.o.   MRN: [id number] [facility] PROGRESS NOTE  KHAMIL LAMICA SNK:[id number] DOB: 01/23/1933 DOA: 03/18/2015 PCP: [name], MD  Brief narrative:    81 year old female with history of hypertension, coronary artery disease, hyperlipidemia, diabetes who presented to [facility] ED with diffuse lower abdominal pain associated with nausea, vomiting and poor po intake. Her mental status has also worsen in past few days while being in hospital as reported by pt's daughter. Pt was initially treated conservatively with supportive care, IVF, antiemetics PRN, NG tube which she ended up pulling out herself likely from altered mental status. Her abd x ray is not showing improvement and surgery plans to do exploratory laparoscopy tomorrow 03/24/15.  Barrier to discharge: not improving on conservative management. Plan for ex lap 03/24/15. Cardio has seen pt in consultation for pre surgical optimization.   Assessment/Plan:    Active Problems: Small bowel obstruction - Secondary to adhesions - Pt initially treated conservatively with IV fluids, antiemetics PRN and NG tube which she pulled out herself - She continued to have nausea and vomiting after NG tube pulled out and abd x ray showed recurrent SBO - Surgery has seen the pt in consultation and now recommend ex lap since pt is not improving - Continue to keep NPO - Order placed for TPN for nutritional support.   Right foot pain - X ray with no acute findings other than degenerative changes   Acute Delirium / Acute encephalopathy - Likely from acute illness  - Minimize narcotic and benzodiazepine.  Coronary artery disease - Stable.  Type 2 diabetes mellitus without complications - Metformin on hold - Since she is NPO we are using SSI  Essential hypertension - Add hydralazine 5 mg IV every 8 hours    DVT Prophylaxis  - Lovenox subQ ordered    Code Status: Full.  Family  Communication:  plan of care discussed with the patient and her family (daughter) at the bedside  Disposition Plan: Home once SBO resolves. Plan for ex lap tomorrow 03/24/15.    IV access:  Peripheral IV  Procedures and diagnostic studies:    Dg Abd Portable 1v 03/23/2015  1. Persistent high-grade partial small bowel obstruction. No significant change from the previous day's study.   Electronically Signed   By: Amie Portland M.D.   On: 03/23/2015 10:01   Dg Abd Portable 1v 03/22/2015  NG tube removed with recurrent small bowel obstruction.   Electronically Signed   By: Odessa Fleming M.D.   On: 03/22/2015 07:42   Dg Abd Portable 1v 03/21/2015   1. NG tube noted with tip in the upper portion of the stomach. Advancement suggested.  2. Interim improvement of small bowel distention. Colonic gas pattern is normal with stool throughout the colon.  These results will be called to the ordering clinician or representative by the Radiologist Assistant, and communication documented in the PACS or zVision Dashboard.   Electronically Signed   By: Maisie Fus  Register   On: 03/21/2015 07:32   Dg Abd Portable 1v 03/20/2015 Small bowel dilation persists consistent with a persistent partial small bowel obstruction. No change from the previous day's study.     Dg Foot 2 Views Right 03/22/2015  Degenerative change without acute abnormality.     Medical Consultants:  Surgery Cardiology - for pre op   Other Consultants:  Physical therapy  IAnti-Infectives:   None    Manson Passey, MD  [facility]  Pager [phone]  Time spent in minutes: 25 minutes  If 7PM-7AM, please contact night-coverage [email or web address] Password [facility] 03/23/2015, 6:14 PM   LOS: 5 days    HPI/Subjective: No acute overnight events. Patient and her family at the bedside reprots pt has ongoing vomiting.   Objective: Filed Vitals:   03/22/15 1741 03/22/15 2209 03/23/15 0543 03/23/15 1428  BP: 173/63 144/68 155/84 163/80  Pulse:  95 112 100   Temp:  98.7 F (37.1 C) 98.8 F (37.1 C) 98.3 F (36.8 C)  TempSrc:  Oral Oral Oral  Resp:  20 18 18   Height:      Weight:      SpO2:  98% 100% 98%   No intake or output data in the 24 hours ending 03/23/15 1814  Exam:   General:  Pt is alert, not in acute distress  Cardiovascular: tachycardia, S1/S2 (+)  Respiratory: Clear to auscultation bilaterally, no wheezing, no crackles, no rhonchi  Abdomen: non tender, non distended, bowel sounds present  Extremities: No edema, pulses DP and PT palpable bilaterally  Neuro: Grossly nonfocal  Data Reviewed: Basic Metabolic Panel:  Recent Labs Lab 03/18/15 0714 03/19/15 0552 03/20/15 0530 03/23/15 1045  NA 135 141 144 137  K 4.1 4.3 4.4 3.9  CL 101 109 109 102  CO2 24 21* 24 22  GLUCOSE 138* 137* 94 137*  BUN 20 32* 36* 21*  CREATININE 1.06* 1.33* 1.09* 0.87  CALCIUM 10.1 8.5* 8.6* 8.7*   Liver Function Tests:  Recent Labs Lab 03/18/15 0714  AST 26  ALT 13*  ALKPHOS 69  BILITOT 0.7  PROT 9.3*  ALBUMIN 4.7    Recent Labs Lab 03/18/15 0714  LIPASE 30   No results for input(s): AMMONIA in the last 168 hours. CBC:  Recent Labs Lab 03/18/15 0714 03/19/15 0552 03/20/15 0530 03/23/15 1000  WBC 6.7 6.8 5.2 7.5  NEUTROABS 5.3  --   --  5.7  HGB 11.7* 11.3* 10.1* 11.2*  HCT 36.0 35.2* 31.9* 35.9*  MCV 92.5 92.6 93.5 94.2  PLT 352 361 344 309   Cardiac Enzymes: No results for input(s): CKTOTAL, CKMB, CKMBINDEX, TROPONINI in the last 168 hours. BNP: Invalid input(s): POCBNP CBG:  Recent Labs Lab 03/22/15 2009 03/23/15 0003 03/23/15 0357 03/23/15 0746 03/23/15 1244  GLUCAP 144* 156* 129* 175* 111*    No results found for this or any previous visit (from the past 240 hour(s)).   Scheduled Meds: . bisacodyl  10 mg Rectal Daily  . clotrimazole  1 Applicatorful Vaginal QHS  . enoxaparin (LOVENOX) injection  40 mg Subcutaneous Q24H  . insulin aspart  0-9 Units Subcutaneous 6 times per day  .  naproxen  375 mg Oral BID WC   Continuous Infusions: . dextrose 5 % and 0.45% NaCl 40 mL/hr at 03/23/15 1800  . dextrose 5 % and 0.45% NaCl    . 07/11/16TPN (CLINIMIX-E) Adult     And  . fat emulsion

## 2015-03-23 NOTE — Progress Notes (Signed)
Patient had x1 large green emesis.

## 2015-03-23 NOTE — Progress Notes (Signed)
Peripherally Inserted Central Catheter/Midline Placement  The IV Nurse has discussed with the patient and/or persons authorized to consent for the patient, the purpose of this procedure and the potential benefits and risks involved with this procedure.  The benefits include less needle sticks, lab draws from the catheter and patient may be discharged home with the catheter.  Risks include, but not limited to, infection, bleeding, blood clot (thrombus formation), and puncture of an artery; nerve damage and irregular heat beat.  Alternatives to this procedure were also discussed.  PICC/Midline Placement Documentation  PICC / Midline Double Lumen 03/23/15 PICC Left Brachial 39 cm 0 cm (Active)  Indication for Insertion or Continuance of Line Administration of hyperosmolar/irritating solutions (i.e. TPN, Vancomycin, etc.) 03/23/2015  7:00 PM  Exposed Catheter (cm) 0 cm 03/23/2015  7:00 PM  Dressing Change Due 03/30/15 03/23/2015  7:00 PM       Stacie Glaze Horton 03/23/2015, 7:24 PM

## 2015-03-23 NOTE — Progress Notes (Signed)
BRIEF NUTRITION NOTE  New consult for new TPN. Pt does not have PICC at this time, but plan for TPN with prolonged period without PO and documented partial SBO secondry to adhesions; slow improvement and NGT removed per MD note yesterday.  RD will see pt for full assessment tomorrow. Recommend TPN per pharmacy. Nutrition needs: 1500-1700 kcal, 75-85 grams protein.    Trenton Gammon, RD, LDN Inpatient Clinical Dietitian Pager # [phone] After hours/weekend pager # [phone]

## 2015-03-23 NOTE — Progress Notes (Signed)
Pt placed on bedpan without results. Pt refused to be removed from bedpan, pt and family educated on the risks of staying on bed pan for long periods of time, verbalized understanding and pt still requesting to stay on bed pan until able to void.  Pt RN and NT notified to check on patient and remove from bedpan.

## 2015-03-23 NOTE — Consult Note (Signed)
CONSULT NOTE  Date: 03/23/2015               Patient Name:  [name] MRN: [id number]  DOB: 18-Mar-1933 Age / Sex: 81 y.o., female        PCP: [name] Primary Cardiologist: Antoine Poche             Referring Physician:  Elisabeth Pigeon              Reason for Consult: Pre-op consult prior to exploratory lap for small bowel obstruction            History of Present Illness: Patient is a 81 y.o. female with a PMHx of  CAD ( s/p stenting in the past), HTN, hyperliidemia, depression , who was admitted to [facility] on 03/18/2015 for evaluation of abdominal pain and SBO.  She is scheduled for an exploratory lap tomorrow if she does not improve. She has not had any cardiac issues.    Talked with husband and her daughter.  They provided much of of the hx.  She seems very depressed and would not answer questions to any great degree.    Medications: Outpatient medications: Facility-administered medications prior to admission  Medication Dose Route Frequency Provider Last Rate Last Dose  . methylPREDNISolone acetate (DEPO-MEDROL) injection 80 mg  80 mg Intramuscular Q30 days Aleksei Plotnikov V, MD      . methylPREDNISolone acetate (DEPO-MEDROL) injection 80 mg  80 mg Intra-articular Once Aleksei Plotnikov V, MD      . methylPREDNISolone acetate PF (DEPO-MEDROL) injection 80 mg  80 mg Intra-articular Once Aleksei Plotnikov V, MD       Prescriptions prior to admission  Medication Sig Dispense Refill Last Dose  . acetaminophen (TYLENOL) 650 MG CR tablet Take 1,300 mg by mouth every 8 (eight) hours as needed for pain.    03/17/2015 at Unknown time  . aspirin 325 MG tablet Take 325 mg by mouth daily.   03/17/2015 at Unknown time  . cholecalciferol (VITAMIN D) 1000 UNITS tablet Take 2 tablets (2,000 Units total) by mouth daily. 200 tablet 3 03/17/2015 at Unknown time  . Cyanocobalamin (VITAMIN B-12) 1000 MCG SUBL Place 1 tablet (1,000 mcg total) under the tongue daily. 100 tablet 3 3-4 days at  Unknown time  . furosemide (LASIX) 20 MG tablet Take 1 tablet (20 mg total) by mouth daily as needed for edema. (Patient taking differently: Take 20 mg by mouth daily. ) 30 tablet 0 has not picked up  . metFORMIN (GLUCOPHAGE) 500 MG tablet TAKE 1 TABLET TWICE DAILY  WITH  A  MEAL 180 tablet 3 03/17/2015 at Unknown time  . OVER THE COUNTER MEDICATION Apply 1 application topically 2 (two) times daily as needed (dryness). Diabetic Lotion.   3-4 days  . oxybutynin (DITROPAN) 5 MG tablet Take 1 tablet (5 mg total) by mouth 2 (two) times daily. 180 tablet 3 03/17/2015 at Unknown time  . Polyethyl Glycol-Propyl Glycol (SYSTANE OP) Apply 1-2 drops to eye daily as needed (dry eyes).   3-4 days  . LORazepam (ATIVAN) 1 MG tablet Take 0.5 tablets (0.5 mg total) by mouth every 8 (eight) hours as needed for anxiety. (Patient not taking: Reported on 03/18/2015) 20 tablet 0 Taking    Current medications: Current Facility-Administered Medications  Medication Dose Route Frequency Provider Last Rate Last Dose  . acetaminophen (TYLENOL) tablet 650 mg  650 mg Oral Q6H PRN Eddie North, MD       Or  .  acetaminophen (TYLENOL) suppository 650 mg  650 mg Rectal Q6H PRN Nishant Dhungel, MD      . bisacodyl (DULCOLAX) suppository 10 mg  10 mg Rectal Daily Emina Riebock, NP   10 mg at 03/23/15 1127  . calcium carbonate (TUMS - dosed in mg elemental calcium) chewable tablet 400 mg of elemental calcium  400 mg of elemental calcium Oral TID PRN Roma Kayser Schorr, NP   400 mg of elemental calcium at 03/23/15 0432  . clotrimazole (GYNE-LOTRIMIN) vaginal cream 1 Applicatorful  1 Applicatorful Vaginal QHS Nishant Dhungel, MD   1 Applicatorful at 03/22/15 2224  . dextrose 5 %-0.45 % sodium chloride infusion   Intravenous Continuous Maurice March, RPH 75 mL/hr at 03/21/15 1429    . dextrose 5 %-0.45 % sodium chloride infusion   Intravenous Continuous Maurice March, RPH      . enoxaparin (LOVENOX) injection 40 mg  40 mg  Subcutaneous Q24H Nishant Dhungel, MD   40 mg at 03/22/15 1733  . TPN (CLINIMIX-E) Adult   Intravenous Continuous TPN Maurice March, [facility]       And  . fat emulsion 20 % infusion 240 mL  240 mL Intravenous Continuous TPN Maurice March, RPH      . hydrALAZINE (APRESOLINE) injection 10 mg  10 mg Intravenous Q6H PRN Nishant Dhungel, MD   10 mg at 03/22/15 1751  . insulin aspart (novoLOG) injection 0-9 Units  0-9 Units Subcutaneous 6 times per day Eddie North, MD   2 Units at 03/23/15 0844  . naproxen (NAPROSYN) tablet 375 mg  375 mg Oral BID WC Nishant Dhungel, MD   375 mg at 03/23/15 0844  . ondansetron (ZOFRAN) tablet 4 mg  4 mg Oral Q6H PRN Nishant Dhungel, MD   4 mg at 03/23/15 1128   Or  . ondansetron (ZOFRAN) injection 4 mg  4 mg Intravenous Q4H PRN Nishant Dhungel, MD   4 mg at 03/23/15 0141  . phenol (CHLORASEPTIC) mouth spray 1 spray  1 spray Mouth/Throat PRN Rolan Lipa, NP   1 spray at 03/20/15 1954  . polyvinyl alcohol (LIQUIFILM TEARS) 1.4 % ophthalmic solution 1 drop  1 drop Both Eyes Daily PRN Nishant Dhungel, MD         Allergies  Allergen Reactions  . Amlodipine Besylate     REACTION: dizzy  . Aspirin   . Atenolol     REACTION: fatigue  . Benazepril     cough  . Benicar [Olmesartan Medoxomil]     HA  . Cozaar     nausea  . Hydrochlorothiazide W-Triamterene     REACTION: dizzy  . Hydrocodone     REACTION: HA  . Hydroxyzine Pamoate   . Iodine   . Lisinopril     REACTION: tired, cough  . Penicillins   . Pravastatin     myalgias  . Prednisolone     My stomach hurts  . Tramadol Hcl     REACTION: HA     Past Medical History  Diagnosis Date  . CAD (coronary artery disease)     s/p stenting of LAD 1999- cath 5-08 EF normal LAD 30-40% restenosis. D1 50% D2 80% LCX & RCA minimal plaque  . HTN (hypertension)   . Hyperlipemia   . Anemia     iron deficiency  . Depression   . DVT (deep venous thrombosis)   . Gout   . Osteoporosis   .  Pancreatitis   . GERD (  gastroesophageal reflux disease)   . Renal insufficiency     Cr 1.2-1.3  . Obesity   . Diabetes mellitus   . Polyarthritis     DJD/ possible PMR  . Vitamin D deficiency   . B12 deficiency   . Tinnitus   . Anxiety   . Aneurysm, thoracic aortic   . Constipation   . Urinary frequency   . Vertigo   . Chronic back pain     Past Surgical History  Procedure Laterality Date  . Hemorrhoid surgery    . Abdominal hysterectomy    . Cholecystectomy    . Tubal ligation      Family History  Problem Relation Age of Onset  . Adopted: Yes  . Diabetes Mother   . Hypertension Father     Social History:  reports that she has never smoked. She does not have any smokeless tobacco history on file. She reports that she does not drink alcohol or use illicit drugs.   Review of Systems: Constitutional:  denies fever, chills, diaphoresis, appetite change and fatigue.  HEENT: denies photophobia, eye pain, redness, hearing loss, ear pain, congestion, sore throat, rhinorrhea, sneezing, neck pain, neck stiffness and tinnitus.  Respiratory: denies SOB, DOE, cough, chest tightness, and wheezing.  Cardiovascular: denies chest pain, palpitations and leg swelling.  Gastrointestinal: admits to nausea, vomiting, abdominal pain,   Genitourinary: denies dysuria, urgency, frequency, hematuria, flank pain and difficulty urinating.  Musculoskeletal: denies  myalgias, back pain, joint swelling, arthralgias and gait problem.   Skin: denies pallor, rash and wound.  Neurological: denies dizziness, seizures, syncope, weakness, light-headedness, numbness and headaches.   Hematological: denies adenopathy, easy bruising, personal or family bleeding history.  Psychiatric/ Behavioral: denies suicidal ideation, mood changes, confusion, nervousness, sleep disturbance and agitation.    Physical Exam: BP 163/80 mmHg  Pulse 100  Temp(Src) 98.3 F (36.8 C) (Oral)  Resp 18  Ht 5\' 4"  (1.626 m)   Wt 63.957 kg (141 lb)  BMI 24.19 kg/m2  SpO2 98%  Wt Readings from Last 3 Encounters:  03/18/15 63.957 kg (141 lb)  12/31/14 65.772 kg (145 lb)  12/07/14 66.906 kg (147 lb 8 oz)    General: Vital signs reviewed and noted. Chronically ill, seems depressed.   Head: Normocephalic, atraumatic, sclera anicteric,   Neck: Supple. Negative for carotid bruits. No JVD   Lungs:  Clear bilaterally, no  wheezes, rales, or rhonchi. Breathing is normal   Heart: RRR with S1 S2. No murmurs, rubs, or gallops   Abdomen/ GI :  Soft, non-tender, non-distended with normoactive bowel sounds. No hepatomegaly. No rebound/guarding. No obvious abdominal masses   MSK: Strength and the appear normal for age.   Extremities: No clubbing or cyanosis. No edema.  Distal pedal pulses are 2+ and equal   Neurologic:  CN are grossly intact,  No obvious motor or sensory defect.  Alert and oriented X 3. Moves all extremities spontaneously.  Psych: Responds to questions appropriately with a normal affect.     Lab results: Basic Metabolic Panel:  Recent Labs Lab 03/19/15 0552 03/20/15 0530 03/23/15 1045  NA 141 144 137  K 4.3 4.4 3.9  CL 109 109 102  CO2 21* 24 22  GLUCOSE 137* 94 137*  BUN 32* 36* 21*  CREATININE 1.33* 1.09* 0.87  CALCIUM 8.5* 8.6* 8.7*    Liver Function Tests:  Recent Labs Lab 03/18/15 0714  AST 26  ALT 13*  ALKPHOS 69  BILITOT 0.7  PROT 9.3*  ALBUMIN  4.7    Recent Labs Lab 03/18/15 0714  LIPASE 30   No results for input(s): AMMONIA in the last 168 hours.  CBC:  Recent Labs Lab 03/18/15 0714 03/19/15 0552 03/20/15 0530 03/23/15 1000  WBC 6.7 6.8 5.2 7.5  NEUTROABS 5.3  --   --  5.7  HGB 11.7* 11.3* 10.1* 11.2*  HCT 36.0 35.2* 31.9* 35.9*  MCV 92.5 92.6 93.5 94.2  PLT 352 361 344 309    Cardiac Enzymes: No results for input(s): CKTOTAL, CKMB, CKMBINDEX, TROPONINI in the last 168 hours.  BNP: Invalid input(s): POCBNP  CBG:  Recent Labs Lab 03/22/15 2009  03/23/15 0003 03/23/15 0357 03/23/15 0746 03/23/15 1244  GLUCAP 144* 156* 129* 175* 111*    Coagulation Studies: No results for input(s): LABPROT, INR in the last 72 hours.   Other results: Personal review of EKG shows :  - NSR , no ST or T wave changes .  Imaging: Dg Abd Portable 1v  03/23/2015   CLINICAL DATA:  Followup small bowel obstruction.  EXAM: PORTABLE ABDOMEN - 1 VIEW  COMPARISON:  03/22/2015  FINDINGS: Dilated loops small bowel are similar to the prior exam allowing for differences in radiographic technique different degrees of magnification.  There is a small amount of air in a nondistended colon.  IMPRESSION: 1. Persistent high-grade partial small bowel obstruction. No significant change from the previous day's study.   Electronically Signed   By: Amie Portland M.D.   On: 03/23/2015 10:01   Dg Abd Portable 1v  03/22/2015   CLINICAL DATA:  81 year old female with recent small bowel obstruction. Initial encounter.  EXAM: PORTABLE ABDOMEN - 1 VIEW  COMPARISON:  03/21/2015 and earlier, including CT Abdomen and Pelvis 03/18/2015.  FINDINGS: Portable AP supine view at 0517 hrs. Enteric tube has been removed. Recurrent dilated gas-filled small bowel loops in the mid abdomen up to 41 mm diameter. Abundant stool now at the splenic flexure. Decreased distal colon gas in the pelvis. Stable cholecystectomy clips. Stable abdominal and pelvic visceral contours. Grossly stable lung bases. Stable visualized osseous structures.  IMPRESSION: NG tube removed with recurrent small bowel obstruction.   Electronically Signed   By: Odessa Fleming M.D.   On: 03/22/2015 07:42   Dg Foot 2 Views Right  03/22/2015   CLINICAL DATA:  Dorsal right foot pain for 4 days, no known injury, initial encounter  EXAM: RIGHT FOOT - 2 VIEW  COMPARISON:  None.  FINDINGS: Degenerative changes are noted in the tarsal bones. No acute fracture or dislocation is noted. A small calcaneal spur is noted.  IMPRESSION: Degenerative change  without acute abnormality.   Electronically Signed   By: Alcide Clever M.D.   On: 03/22/2015 14:57        Assessment & Plan:  1. CAD:  She has a hx of CAD with stenting in the remote past.   She has not had any angina. Seems very stable No dyspnea.  Lives with her husband and is able to do housework without much trouble.  Her PO intake has been nil for the past 4 days.  She is not volume overloaded.   I think she is a low - moderate risk for CV complication with surgery .     No further recs.      Vesta Mixer, Montez Hageman., MD, [facility] 03/23/2015, 5:22 PM Office - [phone] Pager [phone]

## 2015-03-23 NOTE — Progress Notes (Signed)
Patient ID: [name], female   DOB: 12-01-1932, 81 y.o.   MRN: [id number]     [facility]      [address]   [place], [place] [address]    Phone: [phone] FAX: [phone]     Subjective: Vomited overnight.  VSS.  Afebrile.   Objective:  Vital signs:  Filed Vitals:   03/22/15 1557 03/22/15 1741 03/22/15 2209 03/23/15 0543  BP: 173/68 173/63 144/68 155/84  Pulse: 98  95 112  Temp: 99.7 F (37.6 C)  98.7 F (37.1 C) 98.8 F (37.1 C)  TempSrc: Oral  Oral Oral  Resp: 18  20 18   Height:      Weight:      SpO2: 99%  98% 100%    Last BM Date: 03/22/15  Intake/Output   Yesterday:  05/10 0701 - 05/11 0700 In: 1811.3 [P.O.:960; I.V.:851.3] Out: -  This shift:    I/O last 3 completed shifts: In: 2636.3 [P.O.:960; I.V.:1676.3] Out: -    Physical Exam: General: Pt awake/alert/oriented x4 in no acute distress Abdomen: Soft.  Nondistended.  TTP epigastric region.   No evidence of peritonitis.  No incarcerated hernias.   Problem List:   Principal Problem:   Small bowel obstruction Active Problems:   Diabetes type 2, controlled   Adjustment disorder with mixed anxiety and depressed mood   Coronary atherosclerosis   Abdominal pain   Knee pain, bilateral   Elevated blood pressure   Malnutrition of moderate degree    Results:   Labs: Results for orders placed or performed during the hospital encounter of 03/18/15 (from the past 48 hour(s))  Glucose, capillary     Status: Abnormal   Collection Time: 03/21/15 11:47 AM  Result Value Ref Range   Glucose-Capillary 101 (H) 70 - 99 mg/dL  Glucose, capillary     Status: Abnormal   Collection Time: 03/21/15  4:57 PM  Result Value Ref Range   Glucose-Capillary 141 (H) 70 - 99 mg/dL  Glucose, capillary     Status: Abnormal   Collection Time: 03/21/15  7:59 PM  Result Value Ref Range   Glucose-Capillary 101 (H) 70 - 99 mg/dL  Glucose, capillary     Status:  Abnormal   Collection Time: 03/21/15 11:43 PM  Result Value Ref Range   Glucose-Capillary 112 (H) 70 - 99 mg/dL  Glucose, capillary     Status: Abnormal   Collection Time: 03/22/15  4:13 AM  Result Value Ref Range   Glucose-Capillary 157 (H) 70 - 99 mg/dL  Glucose, capillary     Status: Abnormal   Collection Time: 03/22/15  7:36 AM  Result Value Ref Range   Glucose-Capillary 118 (H) 70 - 99 mg/dL  Glucose, capillary     Status: Abnormal   Collection Time: 03/22/15 11:44 AM  Result Value Ref Range   Glucose-Capillary 124 (H) 70 - 99 mg/dL  Uric acid     Status: Abnormal   Collection Time: 03/22/15 12:20 PM  Result Value Ref Range   Uric Acid, Serum 8.0 (H) 2.3 - 6.6 mg/dL  Glucose, capillary     Status: None   Collection Time: 03/22/15  4:32 PM  Result Value Ref Range   Glucose-Capillary 91 70 - 99 mg/dL  Glucose, capillary     Status: Abnormal   Collection Time: 03/22/15  8:09 PM  Result Value Ref Range   Glucose-Capillary 144 (H) 70 - 99 mg/dL  Glucose, capillary  Status: Abnormal   Collection Time: 03/23/15 12:03 AM  Result Value Ref Range   Glucose-Capillary 156 (H) 70 - 99 mg/dL  Glucose, capillary     Status: Abnormal   Collection Time: 03/23/15  3:57 AM  Result Value Ref Range   Glucose-Capillary 129 (H) 70 - 99 mg/dL  Glucose, capillary     Status: Abnormal   Collection Time: 03/23/15  7:46 AM  Result Value Ref Range   Glucose-Capillary 175 (H) 70 - 99 mg/dL    Imaging / Studies: Dg Abd Portable 1v  03/22/2015   CLINICAL DATA:  81 year old female with recent small bowel obstruction. Initial encounter.  EXAM: PORTABLE ABDOMEN - 1 VIEW  COMPARISON:  03/21/2015 and earlier, including CT Abdomen and Pelvis 03/18/2015.  FINDINGS: Portable AP supine view at 0517 hrs. Enteric tube has been removed. Recurrent dilated gas-filled small bowel loops in the mid abdomen up to 41 mm diameter. Abundant stool now at the splenic flexure. Decreased distal colon gas in the pelvis.  Stable cholecystectomy clips. Stable abdominal and pelvic visceral contours. Grossly stable lung bases. Stable visualized osseous structures.  IMPRESSION: NG tube removed with recurrent small bowel obstruction.   Electronically Signed   By: Odessa Fleming M.D.   On: 03/22/2015 07:42   Dg Foot 2 Views Right  03/22/2015   CLINICAL DATA:  Dorsal right foot pain for 4 days, no known injury, initial encounter  EXAM: RIGHT FOOT - 2 VIEW  COMPARISON:  None.  FINDINGS: Degenerative changes are noted in the tarsal bones. No acute fracture or dislocation is noted. A small calcaneal spur is noted.  IMPRESSION: Degenerative change without acute abnormality.   Electronically Signed   By: Alcide Clever M.D.   On: 03/22/2015 14:57    Medications / Allergies:  Scheduled Meds: . bisacodyl  10 mg Rectal Daily  . clotrimazole  1 Applicatorful Vaginal QHS  . enoxaparin (LOVENOX) injection  40 mg Subcutaneous Q24H  . insulin aspart  0-9 Units Subcutaneous 6 times per day  . naproxen  375 mg Oral BID WC   Continuous Infusions: . dextrose 5 % and 0.45% NaCl 75 mL/hr at 03/21/15 1429   PRN Meds:.acetaminophen **OR** acetaminophen, calcium carbonate, hydrALAZINE, LORazepam, ondansetron **OR** ondansetron (ZOFRAN) IV, phenol, polyvinyl alcohol  Antibiotics: Anti-infectives    None       Assessment/Plan HD#5 SBO -despite having 2BMs yesterday, the patient is at the very least partially obstructed.  She vomited around 1AM per daughter which was bilious.  Abdomen is soft, but tender today to the epigastric region.  Check AXR, make her NPO.  If films remain unchanged, replace NGT and will discuss further intervention.  The patient is getting to the point where she needs nutrition. -check CBC and BMP VTE prophylaxis-SCD/lovenox FEN-NPO  I updated her daughter at bedside.   Ashok Norris, [facility] Pager [phone])   03/23/2015 9:17 AM  03/23/15 1240 Addendum: Pt continues to  vomit.  AXR unchanged, perhaps increased SB dilatation.  Will start TPN, insert PICC line.  Cariology for risk assessment with hx of CAD(seen by Dr. Antoine Poche in the past).  Appreciate their assistance.  Plan for ex lap tomorrow pending cardiology recommendations. I spoke with the patients daughters and husband.   Anjana Cheek, ANP-BC

## 2015-03-23 NOTE — Progress Notes (Signed)
PARENTERAL NUTRITION CONSULT NOTE - INITIAL  Pharmacy Consult for TPN Indication: bowel obstruction  Allergies  Allergen Reactions  . Amlodipine Besylate     REACTION: dizzy  . Aspirin   . Atenolol     REACTION: fatigue  . Benazepril     cough  . Benicar [Olmesartan Medoxomil]     HA  . Cozaar     nausea  . Hydrochlorothiazide W-Triamterene     REACTION: dizzy  . Hydrocodone     REACTION: HA  . Hydroxyzine Pamoate   . Iodine   . Lisinopril     REACTION: tired, cough  . Penicillins   . Pravastatin     myalgias  . Prednisolone     My stomach hurts  . Tramadol Hcl     REACTION: HA    Patient Measurements: Height: 5\' 4"  (162.6 cm) Weight: 141 lb (63.957 kg) IBW/kg (Calculated) : 54.7 Adjusted Body Weight: 57.5kg   Vital Signs: Temp: 98.3 F (36.8 C) (05/11 1428) Temp Source: Oral (05/11 1428) BP: 163/80 mmHg (05/11 1428) Pulse Rate: 100 (05/11 1428) Intake/Output from previous day: 05/10 0701 - 05/11 0700 In: 1811.3 [P.O.:960; I.V.:851.3] Out: -  Intake/Output from this shift:    Labs:  Recent Labs  03/23/15 1000  WBC 7.5  HGB 11.2*  HCT 35.9*  PLT 309     Recent Labs  03/23/15 1045  NA 137  K 3.9  CL 102  CO2 22  GLUCOSE 137*  BUN 21*  CREATININE 0.87  CALCIUM 8.7*   Estimated Creatinine Clearance: 45.3 mL/min (by C-G formula based on Cr of 0.87).    Recent Labs  03/23/15 0357 03/23/15 0746 03/23/15 1244  GLUCAP 129* 175* 111*    Medical History: Past Medical History  Diagnosis Date  . CAD (coronary artery disease)     s/p stenting of LAD 1999- cath 5-08 EF normal LAD 30-40% restenosis. D1 50% D2 80% LCX & RCA minimal plaque  . HTN (hypertension)   . Hyperlipemia   . Anemia     iron deficiency  . Depression   . DVT (deep venous thrombosis)   . Gout   . Osteoporosis   . Pancreatitis   . GERD (gastroesophageal reflux disease)   . Renal insufficiency     Cr 1.2-1.3  . Obesity   . Diabetes mellitus   . Polyarthritis      DJD/ possible PMR  . Vitamin D deficiency   . B12 deficiency   . Tinnitus   . Anxiety   . Aneurysm, thoracic aortic   . Constipation   . Urinary frequency   . Vertigo   . Chronic back pain       Insulin Requirements: 3 units in the past 24 hours  Current Nutrition: NPO  IVF: D5 1/2 NS @ 24ml/hr  Central access: plan for PICC 5/11 TPN start date: 5/11  ASSESSMENT  HPI: 81 year old female with history of hypertension, coronary artery disease, hyperlipidemia, iron deficiency anemia, depression, osteoporosis, mild chronic kidney disease presented to the ED with ongoing nausea for 4 days and diffuse crampy lower abdominal pain since one day prior to admission. Associated poor by mouth intake and generalized weakness. Patient was found to have small bowel obstruction.  Pharmacy consulted to start TPN.  Significant events:   Today:    Glucose - 137  Electrolytes -WNL  Renal -WNL  LFTs -will obtain with am labs  TGs -will obtain with am labs  Prealbumin -will obtain with am labs  NUTRITIONAL GOALS                                                                                             RD recs: Clinimix E 5/15 at a goal rate of 9ml/hr + 20% fat emulsion at 15ml/hr to provide: 75-85 g/day protein, 1500-1700 Kcal/day.  PLAN                                                                                                                         At 1800 today:  Start Clinimix E 5/15 at 35 ml/hr.  20% fat emulsion at 39ml/hr.  Plan to advance as tolerated to the goal rate.  TPN to contain standard multivitamins and trace elements.  Reduce IVF to 65ml/hr.  Continue sensitive SSI .   TPN lab panels on Mondays & Thursdays.  F/u daily.   Arley Phenix RPh 03/23/2015, 3:26 PM Pager [phone]

## 2015-03-23 NOTE — Progress Notes (Signed)
Family requests pt not be given ativan.

## 2015-03-24 ENCOUNTER — Inpatient Hospital Stay (HOSPITAL_COMMUNITY): Payer: Commercial Managed Care - HMO | Admitting: Anesthesiology

## 2015-03-24 ENCOUNTER — Encounter (HOSPITAL_COMMUNITY)
Admission: EM | Disposition: A | Discharge status: Skilled Nursing Facility | Payer: Self-pay | Source: Home / Self Care | Attending: Internal Medicine

## 2015-03-24 ENCOUNTER — Inpatient Hospital Stay (HOSPITAL_COMMUNITY): Payer: Commercial Managed Care - HMO

## 2015-03-24 HISTORY — PX: LAPAROTOMY: SHX154

## 2015-03-24 HISTORY — PX: LAPAROSCOPY: SHX197

## 2015-03-24 HISTORY — PX: LAPAROSCOPIC LYSIS OF ADHESIONS: SHX5905

## 2015-03-24 LAB — CBC
HEMATOCRIT: 29.2 % — AB (ref 36.0–46.0)
Hemoglobin: 9.1 g/dL — ABNORMAL LOW (ref 12.0–15.0)
MCH: 28.9 pg (ref 26.0–34.0)
MCHC: 31.2 g/dL (ref 30.0–36.0)
MCV: 92.7 fL (ref 78.0–100.0)
Platelets: 247 10*3/uL (ref 150–400)
RBC: 3.15 MIL/uL — ABNORMAL LOW (ref 3.87–5.11)
RDW: 12.5 % (ref 11.5–15.5)
WBC: 6.1 10*3/uL (ref 4.0–10.5)

## 2015-03-24 LAB — COMPREHENSIVE METABOLIC PANEL
ALBUMIN: 2.6 g/dL — AB (ref 3.5–5.0)
ALT: 10 U/L — ABNORMAL LOW (ref 14–54)
ANION GAP: 10 (ref 5–15)
AST: 17 U/L (ref 15–41)
Alkaline Phosphatase: 63 U/L (ref 38–126)
BILIRUBIN TOTAL: 0.5 mg/dL (ref 0.3–1.2)
BUN: 22 mg/dL — AB (ref 6–20)
CALCIUM: 8.4 mg/dL — AB (ref 8.9–10.3)
CHLORIDE: 99 mmol/L — AB (ref 101–111)
CO2: 27 mmol/L (ref 22–32)
CREATININE: 0.85 mg/dL (ref 0.44–1.00)
GFR calc Af Amer: >60 mL/min (ref >60)
Glucose, Bld: 159 mg/dL — ABNORMAL HIGH (ref 65–99)
POTASSIUM: 3.4 mmol/L — AB (ref 3.5–5.1)
Sodium: 136 mmol/L (ref 135–145)
Total Protein: 6.6 g/dL (ref 6.5–8.1)

## 2015-03-24 LAB — DIFFERENTIAL
Basophils Absolute: 0 10*3/uL (ref 0.0–0.1)
Basophils Relative: 0 % (ref 0–1)
Eosinophils Absolute: 0.1 10*3/uL (ref 0.0–0.7)
Eosinophils Relative: 2 % (ref 0–5)
LYMPHS ABS: 0.7 10*3/uL (ref 0.7–4.0)
Lymphocytes Relative: 12 % (ref 12–46)
Monocytes Absolute: 0.7 10*3/uL (ref 0.1–1.0)
Monocytes Relative: 11 % (ref 3–12)
NEUTROS ABS: 4.6 10*3/uL (ref 1.7–7.7)
NEUTROS PCT: 75 % (ref 43–77)

## 2015-03-24 LAB — PREALBUMIN: PREALBUMIN: 6.9 mg/dL — AB (ref 18–38)

## 2015-03-24 LAB — TRIGLYCERIDES: TRIGLYCERIDES: 62 mg/dL (ref <150)

## 2015-03-24 LAB — PHOSPHORUS: Phosphorus: 3.6 mg/dL (ref 2.5–4.6)

## 2015-03-24 LAB — MRSA PCR SCREENING: MRSA by PCR: NEGATIVE

## 2015-03-24 LAB — GLUCOSE, CAPILLARY
GLUCOSE-CAPILLARY: 115 mg/dL — AB (ref 65–99)
Glucose-Capillary: 144 mg/dL — ABNORMAL HIGH (ref 65–99)
Glucose-Capillary: 150 mg/dL — ABNORMAL HIGH (ref 65–99)
Glucose-Capillary: 153 mg/dL — ABNORMAL HIGH (ref 65–99)
Glucose-Capillary: 178 mg/dL — ABNORMAL HIGH (ref 65–99)

## 2015-03-24 LAB — MAGNESIUM: MAGNESIUM: 1.4 mg/dL — AB (ref 1.7–2.4)

## 2015-03-24 SURGERY — LAPAROSCOPY, DIAGNOSTIC
Anesthesia: General | Site: Abdomen

## 2015-03-24 MED ORDER — POTASSIUM CHLORIDE 10 MEQ/100ML IV SOLN
10.0000 meq | INTRAVENOUS | Status: AC
  Administered 2015-03-24 – 2015-03-25 (×4): 10 meq via INTRAVENOUS
  Filled 2015-03-24 (×4): qty 100

## 2015-03-24 MED ORDER — FENTANYL CITRATE (PF) 100 MCG/2ML IJ SOLN
INTRAMUSCULAR | Status: AC
  Filled 2015-03-24: qty 2

## 2015-03-24 MED ORDER — PROPOFOL 10 MG/ML IV BOLUS
INTRAVENOUS | Status: AC
  Filled 2015-03-24: qty 20

## 2015-03-24 MED ORDER — INSULIN ASPART 100 UNIT/ML ~~LOC~~ SOLN
0.0000 [IU] | SUBCUTANEOUS | Status: DC
  Administered 2015-03-24 – 2015-03-25 (×3): 3 [IU] via SUBCUTANEOUS
  Administered 2015-03-25 – 2015-03-27 (×9): 2 [IU] via SUBCUTANEOUS
  Administered 2015-03-27: 3 [IU] via SUBCUTANEOUS
  Administered 2015-03-27 – 2015-03-29 (×6): 2 [IU] via SUBCUTANEOUS
  Administered 2015-03-29 (×2): 3 [IU] via SUBCUTANEOUS
  Administered 2015-03-29: 2 [IU] via SUBCUTANEOUS
  Administered 2015-03-30 (×2): 3 [IU] via SUBCUTANEOUS

## 2015-03-24 MED ORDER — GLYCOPYRROLATE 0.2 MG/ML IJ SOLN
INTRAMUSCULAR | Status: AC
  Filled 2015-03-24: qty 3

## 2015-03-24 MED ORDER — BUPIVACAINE-EPINEPHRINE 0.25% -1:200000 IJ SOLN
INTRAMUSCULAR | Status: AC
  Filled 2015-03-24: qty 1

## 2015-03-24 MED ORDER — MORPHINE SULFATE 2 MG/ML IJ SOLN: 1.0000 mg | INTRAMUSCULAR | Status: DC | PRN

## 2015-03-24 MED ORDER — ONDANSETRON HCL 4 MG PO TABS: 4.0000 mg | ORAL_TABLET | Freq: Four times a day (QID) | ORAL | Status: DC | PRN

## 2015-03-24 MED ORDER — LABETALOL HCL 5 MG/ML IV SOLN
INTRAVENOUS | Status: AC
  Filled 2015-03-24: qty 4

## 2015-03-24 MED ORDER — HEPARIN SODIUM (PORCINE) 5000 UNIT/ML IJ SOLN
5000.0000 [IU] | Freq: Three times a day (TID) | INTRAMUSCULAR | Status: DC
  Administered 2015-03-24 – 2015-03-26 (×6): 5000 [IU] via SUBCUTANEOUS
  Filled 2015-03-24 (×9): qty 1

## 2015-03-24 MED ORDER — 0.9 % SODIUM CHLORIDE (POUR BTL) OPTIME
TOPICAL | Status: DC | PRN
  Administered 2015-03-24: 6000 mL

## 2015-03-24 MED ORDER — GLYCOPYRROLATE 0.2 MG/ML IJ SOLN
INTRAMUSCULAR | Status: DC | PRN
  Administered 2015-03-24: .5 mg via INTRAVENOUS

## 2015-03-24 MED ORDER — POTASSIUM CHLORIDE 10 MEQ/100ML IV SOLN
10.0000 meq | INTRAVENOUS | Status: DC
  Filled 2015-03-24 (×3): qty 100

## 2015-03-24 MED ORDER — CLINIMIX E/DEXTROSE (5/15) 5 % IV SOLN
INTRAVENOUS | Status: DC
  Filled 2015-03-24: qty 840

## 2015-03-24 MED ORDER — OXYCODONE HCL 5 MG/5ML PO SOLN: 5.0000 mg | Freq: Once | ORAL | Status: DC | PRN

## 2015-03-24 MED ORDER — ONDANSETRON HCL 4 MG/2ML IJ SOLN
INTRAMUSCULAR | Status: AC
  Filled 2015-03-24: qty 2

## 2015-03-24 MED ORDER — LACTATED RINGERS IR SOLN
Status: DC | PRN
  Administered 2015-03-24: 1000 mL

## 2015-03-24 MED ORDER — FENTANYL CITRATE (PF) 250 MCG/5ML IJ SOLN
INTRAMUSCULAR | Status: DC | PRN
  Administered 2015-03-24: 25 ug via INTRAVENOUS
  Administered 2015-03-24 (×4): 50 ug via INTRAVENOUS

## 2015-03-24 MED ORDER — ONDANSETRON HCL 4 MG/2ML IJ SOLN
4.0000 mg | Freq: Four times a day (QID) | INTRAMUSCULAR | Status: DC | PRN
  Administered 2015-03-25 – 2015-04-16 (×5): 4 mg via INTRAVENOUS
  Filled 2015-03-24 (×5): qty 2

## 2015-03-24 MED ORDER — NEOSTIGMINE METHYLSULFATE 10 MG/10ML IV SOLN
INTRAVENOUS | Status: DC | PRN
  Administered 2015-03-24: 4 mg via INTRAVENOUS

## 2015-03-24 MED ORDER — SUCCINYLCHOLINE CHLORIDE 20 MG/ML IJ SOLN
INTRAMUSCULAR | Status: DC | PRN
  Administered 2015-03-24: 100 mg via INTRAVENOUS

## 2015-03-24 MED ORDER — FAT EMULSION 20 % INFUSION TNA - OPTIME
INTRAVENOUS | Status: DC | PRN
  Administered 2015-03-24: 10 mL/h via INTRAVENOUS

## 2015-03-24 MED ORDER — POTASSIUM CHLORIDE 10 MEQ/100ML IV SOLN
10.0000 meq | INTRAVENOUS | Status: DC
  Filled 2015-03-24 (×4): qty 100

## 2015-03-24 MED ORDER — FENTANYL CITRATE (PF) 250 MCG/5ML IJ SOLN
INTRAMUSCULAR | Status: AC
  Filled 2015-03-24: qty 5

## 2015-03-24 MED ORDER — KCL IN DEXTROSE-NACL 20-5-0.45 MEQ/L-%-% IV SOLN
INTRAVENOUS | Status: DC
  Administered 2015-03-24: 16:00:00 via INTRAVENOUS
  Filled 2015-03-24 (×3): qty 1000

## 2015-03-24 MED ORDER — FENTANYL CITRATE (PF) 100 MCG/2ML IJ SOLN
25.0000 ug | INTRAMUSCULAR | Status: DC | PRN
  Administered 2015-03-24 – 2015-03-25 (×7): 25 ug via INTRAVENOUS
  Filled 2015-03-24 (×6): qty 2

## 2015-03-24 MED ORDER — POTASSIUM CHLORIDE IN NACL 20-0.45 MEQ/L-% IV SOLN: INTRAVENOUS | Status: DC

## 2015-03-24 MED ORDER — ONDANSETRON HCL 4 MG/2ML IJ SOLN: 4.0000 mg | Freq: Four times a day (QID) | INTRAMUSCULAR | Status: DC | PRN

## 2015-03-24 MED ORDER — FAT EMULSION 20 % IV EMUL
240.0000 mL | INTRAVENOUS | Status: AC
  Administered 2015-03-24: 240 mL via INTRAVENOUS
  Filled 2015-03-24: qty 250

## 2015-03-24 MED ORDER — ONDANSETRON HCL 4 MG/2ML IJ SOLN
INTRAMUSCULAR | Status: DC | PRN
  Administered 2015-03-24: 4 mg via INTRAVENOUS

## 2015-03-24 MED ORDER — ALBUMIN HUMAN 5 % IV SOLN
INTRAVENOUS | Status: AC
  Filled 2015-03-24: qty 250

## 2015-03-24 MED ORDER — ALBUMIN HUMAN 5 % IV SOLN
INTRAVENOUS | Status: DC | PRN
  Administered 2015-03-24: 11:00:00 via INTRAVENOUS

## 2015-03-24 MED ORDER — OXYCODONE HCL 5 MG PO TABS: 5.0000 mg | ORAL_TABLET | Freq: Once | ORAL | Status: DC | PRN

## 2015-03-24 MED ORDER — DEXTROSE 5 % IV SOLN
INTRAVENOUS | Status: AC
  Filled 2015-03-24: qty 2

## 2015-03-24 MED ORDER — FENTANYL CITRATE (PF) 100 MCG/2ML IJ SOLN
12.5000 ug | INTRAMUSCULAR | Status: DC | PRN
  Administered 2015-03-24: 12.5 ug via INTRAVENOUS
  Filled 2015-03-24 (×2): qty 2

## 2015-03-24 MED ORDER — DEXTROSE 5 % IV SOLN
1.0000 g | Freq: Four times a day (QID) | INTRAVENOUS | Status: AC
  Administered 2015-03-24 – 2015-03-25 (×3): 1 g via INTRAVENOUS
  Filled 2015-03-24 (×3): qty 1

## 2015-03-24 MED ORDER — ACETAMINOPHEN 10 MG/ML IV SOLN
1000.0000 mg | Freq: Four times a day (QID) | INTRAVENOUS | Status: AC
  Administered 2015-03-24 – 2015-03-25 (×4): 1000 mg via INTRAVENOUS
  Filled 2015-03-24 (×5): qty 100

## 2015-03-24 MED ORDER — FENTANYL CITRATE (PF) 100 MCG/2ML IJ SOLN
25.0000 ug | INTRAMUSCULAR | Status: DC | PRN
  Administered 2015-03-24 (×2): 50 ug via INTRAVENOUS

## 2015-03-24 MED ORDER — LACTATED RINGERS IV SOLN
INTRAVENOUS | Status: DC | PRN
  Administered 2015-03-24: 10:00:00 via INTRAVENOUS

## 2015-03-24 MED ORDER — TRACE MINERALS CR-CU-F-FE-I-MN-MO-SE-ZN IV SOLN
INTRAVENOUS | Status: AC
  Administered 2015-03-24: 18:00:00 via INTRAVENOUS
  Filled 2015-03-24: qty 840

## 2015-03-24 MED ORDER — MORPHINE SULFATE 2 MG/ML IJ SOLN: 2.0000 mg | INTRAMUSCULAR | Status: DC | PRN

## 2015-03-24 MED ORDER — LACTATED RINGERS IV SOLN
INTRAVENOUS | Status: DC
  Administered 2015-03-24: 1000 mL via INTRAVENOUS

## 2015-03-24 MED ORDER — NEOSTIGMINE METHYLSULFATE 10 MG/10ML IV SOLN
INTRAVENOUS | Status: AC
  Filled 2015-03-24: qty 1

## 2015-03-24 MED ORDER — PROPOFOL 10 MG/ML IV BOLUS
INTRAVENOUS | Status: DC | PRN
  Administered 2015-03-24: 100 mg via INTRAVENOUS

## 2015-03-24 MED ORDER — MORPHINE SULFATE 4 MG/ML IJ SOLN
4.0000 mg | Freq: Once | INTRAMUSCULAR | Status: AC
  Administered 2015-03-24: 4 mg via INTRAVENOUS
  Filled 2015-03-24: qty 1

## 2015-03-24 MED ORDER — CISATRACURIUM BESYLATE (PF) 10 MG/5ML IV SOLN
INTRAVENOUS | Status: DC | PRN
  Administered 2015-03-24: 2 mg via INTRAVENOUS
  Administered 2015-03-24: 6 mg via INTRAVENOUS
  Administered 2015-03-24: 2 mg via INTRAVENOUS

## 2015-03-24 MED ORDER — CISATRACURIUM BESYLATE 20 MG/10ML IV SOLN
INTRAVENOUS | Status: AC
  Filled 2015-03-24: qty 10

## 2015-03-24 MED ORDER — MAGNESIUM SULFATE 2 GM/50ML IV SOLN
2.0000 g | Freq: Once | INTRAVENOUS | Status: AC
  Administered 2015-03-24: 2 g via INTRAVENOUS
  Filled 2015-03-24: qty 50

## 2015-03-24 MED ORDER — FAT EMULSION 20 % IV EMUL
240.0000 mL | INTRAVENOUS | Status: DC
  Filled 2015-03-24: qty 250

## 2015-03-24 MED ORDER — CEFOXITIN SODIUM 2 G IV SOLR
2.0000 g | INTRAVENOUS | Status: AC
  Administered 2015-03-24: 2 g via INTRAVENOUS

## 2015-03-24 MED ORDER — LABETALOL HCL 5 MG/ML IV SOLN
INTRAVENOUS | Status: DC | PRN
  Administered 2015-03-24 (×2): 2.5 mg via INTRAVENOUS

## 2015-03-24 SURGICAL SUPPLY — 53 items
APL SKNCLS STERI-STRIP NONHPOA (GAUZE/BANDAGES/DRESSINGS)
BENZOIN TINCTURE PRP APPL 2/3 (GAUZE/BANDAGES/DRESSINGS) IMPLANT
CELLS DAT CNTRL 66122 CELL SVR (MISCELLANEOUS) ×1 IMPLANT
CLOSURE WOUND 1/2 X4 (GAUZE/BANDAGES/DRESSINGS)
COVER MAYO STAND STRL (DRAPES) ×2 IMPLANT
DECANTER SPIKE VIAL GLASS SM (MISCELLANEOUS) ×2 IMPLANT
DRAIN CHANNEL 19F RND (DRAIN) ×2 IMPLANT
DRAPE LAPAROSCOPIC ABDOMINAL (DRAPES) ×3 IMPLANT
DRAPE WARM FLUID 44X44 (DRAPE) ×2 IMPLANT
ELECT REM PT RETURN 9FT ADLT (ELECTROSURGICAL) ×3
ELECTRODE REM PT RTRN 9FT ADLT (ELECTROSURGICAL) ×1 IMPLANT
EVACUATOR SILICONE 100CC (DRAIN) ×2 IMPLANT
GAUZE SPONGE 4X4 12PLY STRL (GAUZE/BANDAGES/DRESSINGS) ×2 IMPLANT
GLOVE BIO SURGEON STRL SZ 6.5 (GLOVE) ×1 IMPLANT
GLOVE BIO SURGEONS STRL SZ 6.5 (GLOVE) ×1
GLOVE BIOGEL M 8.0 STRL (GLOVE) ×3 IMPLANT
GLOVE BIOGEL PI IND STRL 6.5 (GLOVE) IMPLANT
GLOVE BIOGEL PI IND STRL 7.0 (GLOVE) ×1 IMPLANT
GLOVE BIOGEL PI INDICATOR 6.5 (GLOVE) ×2
GLOVE BIOGEL PI INDICATOR 7.0 (GLOVE) ×4
GLOVE SURG SS PI 6.5 STRL IVOR (GLOVE) ×2 IMPLANT
GOWN SPEC L4 XLG W/TWL (GOWN DISPOSABLE) ×3 IMPLANT
GOWN STRL REUS W/TWL XL LVL3 (GOWN DISPOSABLE) ×7 IMPLANT
IV LACTATED RINGERS 1000ML (IV SOLUTION) ×2 IMPLANT
KIT BASIN OR (CUSTOM PROCEDURE TRAY) ×3 IMPLANT
RETRACTOR WND ALEXIS 18 MED (MISCELLANEOUS) IMPLANT
RTRCTR WOUND ALEXIS 18CM MED (MISCELLANEOUS) ×3
SET IRRIG TUBING LAPAROSCOPIC (IRRIGATION / IRRIGATOR) ×2 IMPLANT
SHEARS HARMONIC ACE PLUS 36CM (ENDOMECHANICALS) ×2 IMPLANT
SLEEVE XCEL OPT CAN 5 100 (ENDOMECHANICALS) ×2 IMPLANT
SLEEVE Z-THREAD 5X100MM (TROCAR) ×2 IMPLANT
SOLUTION ANTI FOG 6CC (MISCELLANEOUS) ×3 IMPLANT
STRIP CLOSURE SKIN 1/2X4 (GAUZE/BANDAGES/DRESSINGS) IMPLANT
SUCTION POOLE TIP (SUCTIONS) ×2 IMPLANT
SUT ETHILON 3 0 PS 1 (SUTURE) ×2 IMPLANT
SUT NOVA 1 T20/GS 25DT (SUTURE) ×6 IMPLANT
SUT PDS AB 1 CTX 36 (SUTURE) ×2 IMPLANT
SUT PDS AB 4-0 SH 27 (SUTURE) ×2 IMPLANT
SUT SILK 2 0 (SUTURE) ×3
SUT SILK 2-0 18XBRD TIE 12 (SUTURE) IMPLANT
SUT SILK 3 0 (SUTURE) ×3
SUT SILK 3-0 FS1 18XBRD (SUTURE) IMPLANT
SUT VIC AB 3-0 SH 8-18 (SUTURE) ×2 IMPLANT
SUT VIC AB 4-0 SH 18 (SUTURE) ×2 IMPLANT
SYR 30ML LL (SYRINGE) ×3 IMPLANT
TAPE CLOTH SURG 4X10 WHT LF (GAUZE/BANDAGES/DRESSINGS) ×2 IMPLANT
TOWEL OR 17X26 10 PK STRL BLUE (TOWEL DISPOSABLE) ×4 IMPLANT
TRAY FOLEY W/METER SILVER 14FR (SET/KITS/TRAYS/PACK) ×3 IMPLANT
TRAY LAPAROSCOPIC (CUSTOM PROCEDURE TRAY) ×3 IMPLANT
TROCAR BLADELESS OPT 5 100 (ENDOMECHANICALS) ×2 IMPLANT
TROCAR XCEL NON-BLD 11X100MML (ENDOMECHANICALS) IMPLANT
TROCAR XCEL UNIV SLVE 11M 100M (ENDOMECHANICALS) IMPLANT
TUBING INSUFFLATION 10FT LAP (TUBING) ×3 IMPLANT

## 2015-03-24 NOTE — Anesthesia Postprocedure Evaluation (Signed)
Anesthesia Post Note  Patient: [name]  Procedure(s) Performed: Procedure(s) (LRB): LAPAROSCOPY DIAGNOSTIC (N/A) LAPAROSCOPIC LYSIS OF ADHESIONS (N/A) LAPAROTOMY with decompression of bowel (N/A)  Anesthesia type: General  Patient location: PACU  Post pain: Pain level controlled and Adequate analgesia  Post assessment: Post-op Vital signs reviewed, Patient's Cardiovascular Status Stable, Respiratory Function Stable, Patent Airway and Pain level controlled  Last Vitals:  Filed Vitals:   03/24/15 1400  BP: 122/44  Pulse: 114  Temp: 36.9 C  Resp: 21    Post vital signs: Reviewed and stable  Level of consciousness: awake, alert  and oriented  Complications: No apparent anesthesia complications

## 2015-03-24 NOTE — H&P (View-Only) (Signed)
Patient ID: [name], female   DOB: 08-Sep-1933, 81 y.o.   MRN: [id number]  Not much improvement on AXR.  Proceed with diagnostic laparoscopy possible laparotomy.  Surgical risks including but not limited to infection, bleeding, bowel injury, anesthesia risks.  The patient son and daughter at bedside during this discussion. verbalize understanding and wish to proceed.  To OR this AM.  Ferguson Gertner, ANP-BC

## 2015-03-24 NOTE — Progress Notes (Signed)
Patient ID: [name], female   DOB: [birth year], 81 y.o.   MRN: [id number]     [address] [place], [place] [address]    Phone: [phone] FAX: [phone]     Subjective: k supplemented.  On TNA.  817m out.  1 BM.    Objective:  Vital signs:  Filed Vitals:   03/23/15 0543 03/23/15 1428 03/23/15 2015 03/24/15 0433  BP: 155/84 163/80 180/79 178/82  Pulse: 112 100 103 96  Temp: 98.8 F (37.1 C) 98.3 F (36.8 C) 98.7 F (37.1 C) 97.6 F (36.4 C)  TempSrc: Oral Oral Oral Oral  Resp: 18 18 18 20   Height:      Weight:      SpO2: 100% 98% 100% 97%    Last BM Date: 03/22/15  Intake/Output   Yesterday:  05/11 0701 - 05/12 0700 In: 672.6 [I.V.:360; TPN:312.6] Out: 600 [Emesis/NG output:600] This shift:      Physical Exam: General: Pt awake/alert/oriented x4 in no acute distress Chest: cta. Bilaterally.  No chest wall pain w good excursion CV:  Pulses intact.  Regular rhythm Abdomen: Soft.  Nondistended.  TTP epigastric region.  No evidence of peritonitis.  No incarcerated hernias. Ext:  SCDs BLE.  No mjr edema.  No cyanosis.  TTP right lower leg.  Skin: No petechiae / purpura.   Problem List:   Principal Problem:   Small bowel obstruction Active Problems:   Diabetes type 2, controlled   Adjustment disorder with mixed anxiety and depressed mood   Coronary atherosclerosis   Abdominal pain   Knee pain, bilateral   Elevated blood pressure   Malnutrition of moderate degree    Results:   Labs: Results for orders placed or performed during the hospital encounter of 03/18/15 (from the past 48 hour(s))  Glucose, capillary     Status: Abnormal   Collection Time: 03/22/15 11:44 AM  Result Value Ref Range   Glucose-Capillary 124 (H) 70 - 99 mg/dL  Uric acid     Status: Abnormal   Collection Time: 03/22/15 12:20 PM  Result Value Ref Range   Uric Acid, Serum 8.0 (H) 2.3 - 6.6 mg/dL   Glucose, capillary     Status: None   Collection Time: 03/22/15  4:32 PM  Result Value Ref Range   Glucose-Capillary 91 70 - 99 mg/dL  Glucose, capillary     Status: Abnormal   Collection Time: 03/22/15  8:09 PM  Result Value Ref Range   Glucose-Capillary 144 (H) 70 - 99 mg/dL  Glucose, capillary     Status: Abnormal   Collection Time: 03/23/15 12:03 AM  Result Value Ref Range   Glucose-Capillary 156 (H) 70 - 99 mg/dL  Glucose, capillary     Status: Abnormal   Collection Time: 03/23/15  3:57 AM  Result Value Ref Range   Glucose-Capillary 129 (H) 70 - 99 mg/dL  Glucose, capillary     Status: Abnormal   Collection Time: 03/23/15  7:46 AM  Result Value Ref Range   Glucose-Capillary 175 (H) 70 - 99 mg/dL  CBC with Differential/Platelet     Status: Abnormal   Collection Time: 03/23/15 10:00 AM  Result Value Ref Range   WBC 7.5 4.0 - 10.5 K/uL   RBC 3.81 (L) 3.87 - 5.11 MIL/uL   Hemoglobin 11.2 (L) 12.0 - 15.0 g/dL   HCT 35.9 (L) 36.0 - 46.0 %   MCV 94.2 78.0 -  100.0 fL   MCH 29.4 26.0 - 34.0 pg   MCHC 31.2 30.0 - 36.0 g/dL   RDW 12.7 11.5 - 15.5 %   Platelets 309 150 - 400 K/uL   Neutrophils Relative % 76 43 - 77 %   Neutro Abs 5.7 1.7 - 7.7 K/uL   Lymphocytes Relative 14 12 - 46 %   Lymphs Abs 1.1 0.7 - 4.0 K/uL   Monocytes Relative 9 3 - 12 %   Monocytes Absolute 0.7 0.1 - 1.0 K/uL   Eosinophils Relative 1 0 - 5 %   Eosinophils Absolute 0.0 0.0 - 0.7 K/uL   Basophils Relative 0 0 - 1 %   Basophils Absolute 0.0 0.0 - 0.1 K/uL  Basic metabolic panel     Status: Abnormal   Collection Time: 03/23/15 10:45 AM  Result Value Ref Range   Sodium 137 135 - 145 mmol/L   Potassium 3.9 3.5 - 5.1 mmol/L   Chloride 102 101 - 111 mmol/L   CO2 22 22 - 32 mmol/L   Glucose, Bld 137 (H) 70 - 99 mg/dL   BUN 21 (H) 6 - 20 mg/dL   Creatinine, Ser 0.87 0.44 - 1.00 mg/dL   Calcium 8.7 (L) 8.9 - 10.3 mg/dL   GFR calc non Af Amer >60 >60 mL/min   GFR calc Af Amer >60 >60 mL/min    Comment:  (NOTE) The eGFR has been calculated using the CKD EPI equation. This calculation has not been validated in all clinical situations. eGFR's persistently <60 mL/min signify possible Chronic Kidney Disease.    Anion gap 13 5 - 15  Glucose, capillary     Status: Abnormal   Collection Time: 03/23/15 12:44 PM  Result Value Ref Range   Glucose-Capillary 111 (H) 70 - 99 mg/dL  Glucose, capillary     Status: Abnormal   Collection Time: 03/23/15  8:21 PM  Result Value Ref Range   Glucose-Capillary 125 (H) 70 - 99 mg/dL  Glucose, capillary     Status: Abnormal   Collection Time: 03/23/15 11:40 PM  Result Value Ref Range   Glucose-Capillary 130 (H) 70 - 99 mg/dL  Glucose, capillary     Status: Abnormal   Collection Time: 03/24/15  4:17 AM  Result Value Ref Range   Glucose-Capillary 144 (H) 65 - 99 mg/dL  Comprehensive metabolic panel     Status: Abnormal   Collection Time: 03/24/15  5:33 AM  Result Value Ref Range   Sodium 136 135 - 145 mmol/L   Potassium 3.4 (L) 3.5 - 5.1 mmol/L   Chloride 99 (L) 101 - 111 mmol/L   CO2 27 22 - 32 mmol/L   Glucose, Bld 159 (H) 65 - 99 mg/dL   BUN 22 (H) 6 - 20 mg/dL   Creatinine, Ser 0.85 0.44 - 1.00 mg/dL   Calcium 8.4 (L) 8.9 - 10.3 mg/dL   Total Protein 6.6 6.5 - 8.1 g/dL   Albumin 2.6 (L) 3.5 - 5.0 g/dL   AST 17 15 - 41 U/L   ALT 10 (L) 14 - 54 U/L   Alkaline Phosphatase 63 38 - 126 U/L   Total Bilirubin 0.5 0.3 - 1.2 mg/dL   GFR calc non Af Amer >60 >60 mL/min   GFR calc Af Amer >60 >60 mL/min    Comment: (NOTE) The eGFR has been calculated using the CKD EPI equation. This calculation has not been validated in all clinical situations. eGFR's persistently <60 mL/min signify possible Chronic Kidney  Disease.    Anion gap 10 5 - 15  Prealbumin     Status: Abnormal   Collection Time: 03/24/15  5:33 AM  Result Value Ref Range   Prealbumin 6.9 (L) 18 - 38 mg/dL    Comment: Performed at [facility]  Magnesium     Status: Abnormal    Collection Time: 03/24/15  5:33 AM  Result Value Ref Range   Magnesium 1.4 (L) 1.7 - 2.4 mg/dL  Phosphorus     Status: None   Collection Time: 03/24/15  5:33 AM  Result Value Ref Range   Phosphorus 3.6 2.5 - 4.6 mg/dL  Triglycerides     Status: None   Collection Time: 03/24/15  5:33 AM  Result Value Ref Range   Triglycerides 62 <150 mg/dL    Comment: Performed at [facility]  CBC     Status: Abnormal   Collection Time: 03/24/15  5:33 AM  Result Value Ref Range   WBC 6.1 4.0 - 10.5 K/uL   RBC 3.15 (L) 3.87 - 5.11 MIL/uL   Hemoglobin 9.1 (L) 12.0 - 15.0 g/dL   HCT 29.2 (L) 36.0 - 46.0 %   MCV 92.7 78.0 - 100.0 fL   MCH 28.9 26.0 - 34.0 pg   MCHC 31.2 30.0 - 36.0 g/dL   RDW 12.5 11.5 - 15.5 %   Platelets 247 150 - 400 K/uL  Differential     Status: None   Collection Time: 03/24/15  5:33 AM  Result Value Ref Range   Neutrophils Relative % 75 43 - 77 %   Neutro Abs 4.6 1.7 - 7.7 K/uL   Lymphocytes Relative 12 12 - 46 %   Lymphs Abs 0.7 0.7 - 4.0 K/uL   Monocytes Relative 11 3 - 12 %   Monocytes Absolute 0.7 0.1 - 1.0 K/uL   Eosinophils Relative 2 0 - 5 %   Eosinophils Absolute 0.1 0.0 - 0.7 K/uL   Basophils Relative 0 0 - 1 %   Basophils Absolute 0.0 0.0 - 0.1 K/uL  Glucose, capillary     Status: Abnormal   Collection Time: 03/24/15  7:36 AM  Result Value Ref Range   Glucose-Capillary 115 (H) 65 - 99 mg/dL    Imaging / Studies: Dg Abd Portable 1v  03/23/2015   CLINICAL DATA:  Followup small bowel obstruction.  EXAM: PORTABLE ABDOMEN - 1 VIEW  COMPARISON:  03/22/2015  FINDINGS: Dilated loops small bowel are similar to the prior exam allowing for differences in radiographic technique different degrees of magnification.  There is a small amount of air in a nondistended colon.  IMPRESSION: 1. Persistent high-grade partial small bowel obstruction. No significant change from the previous day's study.   Electronically Signed   By: Lajean Manes M.D.   On: 03/23/2015 10:01    Dg Foot 2 Views Right  03/22/2015   CLINICAL DATA:  Dorsal right foot pain for 4 days, no known injury, initial encounter  EXAM: RIGHT FOOT - 2 VIEW  COMPARISON:  None.  FINDINGS: Degenerative changes are noted in the tarsal bones. No acute fracture or dislocation is noted. A small calcaneal spur is noted.  IMPRESSION: Degenerative change without acute abnormality.   Electronically Signed   By: Inez Catalina M.D.   On: 03/22/2015 14:57    Medications / Allergies:  Scheduled Meds: . bisacodyl  10 mg Rectal Daily  . [START ON 03/25/2015] clotrimazole  1 Applicatorful Vaginal q morning - 10a  . enoxaparin (  LOVENOX) injection  40 mg Subcutaneous Q24H  . hydrALAZINE  5 mg Intravenous 3 times per day  . insulin aspart  0-9 Units Subcutaneous 6 times per day  . magnesium sulfate 1 - 4 g bolus IVPB  2 g Intravenous Once  . naproxen  375 mg Oral BID WC  . potassium chloride  10 mEq Intravenous Q1 Hr x 4   Continuous Infusions: . dextrose 5 % and 0.45% NaCl 40 mL/hr at 03/23/15 1800  . dextrose 5 % and 0.45% NaCl    . Marland KitchenTPN (CLINIMIX-E) Adult 35 mL/hr at 03/23/15 2003   And  . fat emulsion 240 mL (03/23/15 2004)   PRN Meds:.acetaminophen **OR** acetaminophen, calcium carbonate, hydrALAZINE, morphine injection, ondansetron **OR** ondansetron (ZOFRAN) IV, phenol, polyvinyl alcohol, sodium chloride  Antibiotics: Anti-infectives    None        Assessment/Plan HD#5 SBO -soft but tender to epigastric region, 839m of bilious output, 1 BM yesterday.  Will await AXR, if no improvement, will proceed with Elap today. VTE prophylaxis-SCD/lovenox FEN-NPO, NGT, TNA  Carliss Quast, ANP-BC [facility] Pager [phone](7A-4:30P)   03/24/2015 8:16 AM

## 2015-03-24 NOTE — Progress Notes (Addendum)
Patient ID: [name], female   DOB: 1933/04/01, 81 y.o.   MRN: [id number] [facility] PROGRESS NOTE  RUBBIE LAPOINTE SLH:[id number] DOB: 1933/03/30 DOA: 03/18/2015 PCP: [name], MD  Brief narrative:    81 year old female with history of hypertension, coronary artery disease, hyperlipidemia, diabetes who presented to [facility] ED with diffuse lower abdominal pain associated with nausea, vomiting and poor po intake.   Pt was found to have SBO. She was initially treated conservatively with supportive care, IVF, antiemetics PRN, NG tube. She pulled it out but surgery put the tube back in 03/23/2015.  Since no significant improvement plan is for laparoscopic surgery today.   Barrier to discharge: Plan for ex lap 03/24/15.  Assessment/Plan:    Active Problems: Abdominal pain, nausea, vomiting / Small bowel obstruction - Secondary to adhesions. -  Patient is not improving with conservative treatment. She was placed on nothing by mouth, IV fluids, antiemetics as needed. She initially pulled NG tube but he was put back by surgery 03/23/2015. -  Cardiology clearance provided. Plan is for exploratory laparotomy today. -  Patient is on TPN for nutritional support.  Right foot pain - X ray with no acute findings other than degenerative changes   Acute Delirium / Acute encephalopathy - Likely from acute illness  -  Slightly better mental status this morning.  Coronary artery disease - Stable.  Type 2 diabetes mellitus without complications -  Continue to hold metformin since patient is nothing by mouth. We are using sliding scale insulin only. - CBG's in past 24 hours: 130, 144, 115.  Essential hypertension - Continue hydralazine 5 mg IV every 6 hours along with as needed hydralazine.  Moderate protein calorie malnutrition -  Poor nutrition secondary to small bowel obstruction. -  Continue TPN for nutritional support.   DVT Prophylaxis  - Lovenox subQ ordered while pt in  hospital   Code Status: Full.  Family Communication:  plan of care discussed with the patient and her family (daughter) at the bedside  Disposition Plan: Ex lap  03/24/15.    IV access:  Peripheral IV  Procedures and diagnostic studies:    Dg Abd Portable 1v 03/23/2015  1. Persistent high-grade partial small bowel obstruction. No significant change from the previous day's study.   Electronically Signed   By: Amie Portland M.D.   On: 03/23/2015 10:01   Dg Abd Portable 1v 03/22/2015  NG tube removed with recurrent small bowel obstruction.   Electronically Signed   By: Odessa Fleming M.D.   On: 03/22/2015 07:42   Dg Abd Portable 1v 03/21/2015   1. NG tube noted with tip in the upper portion of the stomach. Advancement suggested.  2. Interim improvement of small bowel distention. Colonic gas pattern is normal with stool throughout the colon.  These results will be called to the ordering clinician or representative by the Radiologist Assistant, and communication documented in the PACS or zVision Dashboard.   Electronically Signed   By: Maisie Fus  Register   On: 03/21/2015 07:32   Dg Abd Portable 1v 03/20/2015 Small bowel dilation persists consistent with a persistent partial small bowel obstruction. No change from the previous day's study.     Dg Foot 2 Views Right 03/22/2015  Degenerative change without acute abnormality.     Medical Consultants:  Surgery Cardiology - for pre op   Other Consultants:  Physical therapy  IAnti-Infectives:   None    Manson Passey, MD  [facility] Pager [phone]  Time spent in minutes: 15 minutes  If 7PM-7AM, please contact night-coverage [email or web address] Password [email or web address] 03/24/2015, 10:03 AM   LOS: 6 days    HPI/Subjective: No acute overnight events. Patient reports nausea, no vomiting.   Objective: Filed Vitals:   03/23/15 0543 03/23/15 1428 03/23/15 2015 03/24/15 0433  BP: 155/84 163/80 180/79 178/82  Pulse: 112 100 103 96  Temp: 98.8 F (37.1 C) 98.3  F (36.8 C) 98.7 F (37.1 C) 97.6 F (36.4 C)  TempSrc: Oral Oral Oral Oral  Resp: 18 18 18 20   Height:      Weight:      SpO2: 100% 98% 100% 97%    Intake/Output Summary (Last 24 hours) at 03/24/15 1003 Last data filed at 03/24/15 0600  Gross per 24 hour  Intake 672.58 ml  Output    600 ml  Net  72.58 ml    Exam:   General:  Pt is alert, not in acute distress  Cardiovascular: RRR, S1/S2 (+)  Respiratory: no wheezing, no crackles  Abdomen: non tender, non distended  Extremities: trace pedal edema, pulses palpable bilaterally  Neuro: Nonfocal  Data Reviewed: Basic Metabolic Panel:  Recent Labs Lab 03/18/15 0714 03/19/15 0552 03/20/15 0530 03/23/15 1045 03/24/15 0533  NA 135 141 144 137 136  K 4.1 4.3 4.4 3.9 3.4*  CL 101 109 109 102 99*  CO2 24 21* 24 22 27   GLUCOSE 138* 137* 94 137* 159*  BUN 20 32* 36* 21* 22*  CREATININE 1.06* 1.33* 1.09* 0.87 0.85  CALCIUM 10.1 8.5* 8.6* 8.7* 8.4*  MG  --   --   --   --  1.4*  PHOS  --   --   --   --  3.6   Liver Function Tests:  Recent Labs Lab 03/18/15 0714 03/24/15 0533  AST 26 17  ALT 13* 10*  ALKPHOS 69 63  BILITOT 0.7 0.5  PROT 9.3* 6.6  ALBUMIN 4.7 2.6*    Recent Labs Lab 03/18/15 0714  LIPASE 30   No results for input(s): AMMONIA in the last 168 hours. CBC:  Recent Labs Lab 03/18/15 0714 03/19/15 0552 03/20/15 0530 03/23/15 1000 03/24/15 0533  WBC 6.7 6.8 5.2 7.5 6.1  NEUTROABS 5.3  --   --  5.7 4.6  HGB 11.7* 11.3* 10.1* 11.2* 9.1*  HCT 36.0 35.2* 31.9* 35.9* 29.2*  MCV 92.5 92.6 93.5 94.2 92.7  PLT 352 361 344 309 247   Cardiac Enzymes: No results for input(s): CKTOTAL, CKMB, CKMBINDEX, TROPONINI in the last 168 hours. BNP: Invalid input(s): POCBNP CBG:  Recent Labs Lab 03/23/15 1244 03/23/15 2021 03/23/15 2340 03/24/15 0417 03/24/15 0736  GLUCAP 111* 125* 130* 144* 115*    No results found for this or any previous visit (from the past 240 hour(s)).   Scheduled  Meds: . [MAR Hold] bisacodyl  10 mg Rectal Daily  . cefOXitin  2 g Intravenous On Call to OR  . [MAR Hold] clotrimazole  1 Applicatorful Vaginal q morning - 10a  . [MAR Hold] enoxaparin (LOVENOX) injection  40 mg Subcutaneous Q24H  . [MAR Hold] hydrALAZINE  5 mg Intravenous 3 times per day  . [MAR Hold] insulin aspart  0-9 Units Subcutaneous 6 times per day  . [MAR Hold] naproxen  375 mg Oral BID WC  . [MAR Hold] potassium chloride  10 mEq Intravenous Q1 Hr x 4   Continuous Infusions: . dextrose 5 % and 0.45% NaCl 40 mL/hr at 03/23/15 1800  .  dextrose 5 % and 0.45% NaCl    . Marland KitchenTPN (CLINIMIX-E) Adult 35 mL/hr at 03/23/15 2003   And  . fat emulsion 240 mL (03/23/15 2004)

## 2015-03-24 NOTE — Anesthesia Procedure Notes (Addendum)
Procedure Name: Intubation Date/Time: 03/24/2015 10:34 AM Performed by: Delphia Grates Pre-anesthesia Checklist: Patient identified, Emergency Drugs available, Suction available and Patient being monitored Patient Re-evaluated:Patient Re-evaluated prior to inductionOxygen Delivery Method: Circle system utilized Preoxygenation: Pre-oxygenation with 100% oxygen Intubation Type: IV induction, Rapid sequence and Cricoid Pressure applied Laryngoscope Size: Mac and 4 Tube type: Subglottic suction tube Tube size: 7.5 mm Number of attempts: 1 Airway Equipment and Method: Stylet Placement Confirmation: ETT inserted through vocal cords under direct vision,  positive ETCO2 and breath sounds checked- equal and bilateral Secured at: 21 cm Tube secured with: Tape Comments: NGT connected to suction on adm to OR.

## 2015-03-24 NOTE — Transfer of Care (Signed)
Immediate Anesthesia Transfer of Care Note  Patient: [name]  Procedure(s) Performed: Procedure(s): LAPAROSCOPY DIAGNOSTIC (N/A) LAPAROSCOPIC LYSIS OF ADHESIONS (N/A) LAPAROTOMY with decompression of bowel (N/A)  Patient Location: PACU  Anesthesia Type:General  Level of Consciousness: awake, alert  and patient cooperative  Airway & Oxygen Therapy: Patient Spontanous Breathing and Patient connected to face mask oxygen  Post-op Assessment: Report given to RN and Post -op Vital signs reviewed and stable  Post vital signs: Reviewed and stable  Last Vitals:  Filed Vitals:   03/24/15 0433  BP: 178/82  Pulse: 96  Temp: 36.4 C  Resp: 20    Complications: No apparent anesthesia complications

## 2015-03-24 NOTE — Progress Notes (Signed)
Patient ID: [name], female   DOB: 07/27/1933, 81 y.o.   MRN: [id number]  Not much improvement on AXR.  Proceed with diagnostic laparoscopy possible laparotomy.  Surgical risks including but not limited to infection, bleeding, bowel injury, anesthesia risks.  The patient son and daughter at bedside during this discussion. verbalize understanding and wish to proceed.  To OR this AM.  Roth Ress, ANP-BC 

## 2015-03-24 NOTE — Progress Notes (Signed)
PARENTERAL NUTRITION CONSULT NOTE - follow up  Pharmacy Consult for TPN Indication: bowel obstruction  Allergies  Allergen Reactions  . Amlodipine Besylate     REACTION: dizzy  . Aspirin   . Atenolol     REACTION: fatigue  . Benazepril     cough  . Benicar [Olmesartan Medoxomil]     HA  . Cozaar     nausea  . Hydrochlorothiazide W-Triamterene     REACTION: dizzy  . Hydrocodone     REACTION: HA  . Hydroxyzine Pamoate   . Iodine   . Lisinopril     REACTION: tired, cough  . Penicillins Itching  . Pravastatin     myalgias  . Prednisolone     My stomach hurts  . Tramadol Hcl     REACTION: HA    Patient Measurements: Height: 5\' 4"  (162.6 cm) Weight: 141 lb (63.957 kg) IBW/kg (Calculated) : 54.7 Adjusted Body Weight: 57.5kg   Vital Signs: Temp: 97.6 F (36.4 C) (05/12 0433) Temp Source: Oral (05/12 0433) BP: 178/82 mmHg (05/12 0433) Pulse Rate: 96 (05/12 0433) Intake/Output from previous day: 05/11 0701 - 05/12 0700 In: 672.6 [I.V.:360; TPN:312.6] Out: 600 [Emesis/NG output:600] Intake/Output from this shift:    Labs:  Recent Labs  03/23/15 1000 03/24/15 0533  WBC 7.5 6.1  HGB 11.2* 9.1*  HCT 35.9* 29.2*  PLT 309 247     Recent Labs  03/23/15 1045 03/24/15 0533  NA 137 136  K 3.9 3.4*  CL 102 99*  CO2 22 27  GLUCOSE 137* 159*  BUN 21* 22*  CREATININE 0.87 0.85  CALCIUM 8.7* 8.4*  MG  --  1.4*  PHOS  --  3.6  PROT  --  6.6  ALBUMIN  --  2.6*  AST  --  17  ALT  --  10*  ALKPHOS  --  63  BILITOT  --  0.5  PREALBUMIN  --  6.9*  TRIG  --  62   Estimated Creatinine Clearance: 46.3 mL/min (by C-G formula based on Cr of 0.85).    Recent Labs  03/23/15 2340 03/24/15 0417 03/24/15 0736  GLUCAP 130* 144* 115*    Medical History: Past Medical History  Diagnosis Date  . CAD (coronary artery disease)     s/p stenting of LAD 1999- cath 5-08 EF normal LAD 30-40% restenosis. D1 50% D2 80% LCX & RCA minimal plaque  . HTN (hypertension)    . Hyperlipemia   . Anemia     iron deficiency  . Depression   . DVT (deep venous thrombosis)   . Gout   . Osteoporosis   . Pancreatitis   . GERD (gastroesophageal reflux disease)   . Renal insufficiency     Cr 1.2-1.3  . Obesity   . Diabetes mellitus   . Polyarthritis     DJD/ possible PMR  . Vitamin D deficiency   . B12 deficiency   . Tinnitus   . Anxiety   . Aneurysm, thoracic aortic   . Constipation   . Urinary frequency   . Vertigo   . Chronic back pain       Insulin Requirements: 3 units in the past 24 hours  Current Nutrition: NPO  IVF: D5 1/2 NS @ 63ml/hr  Central access: plan for PICC 5/11 TPN start date: 5/11  ASSESSMENT  HPI: 81 year old female with history of hypertension, coronary artery disease, hyperlipidemia, iron deficiency anemia, depression, osteoporosis, mild chronic kidney disease presented to the ED with ongoing nausea for 4 days and diffuse crampy lower abdominal pain since one day prior to admission. Associated poor by mouth intake and generalized weakness. Patient was found to have small bowel obstruction.  Pharmacy consulted to start TPN.  Significant events:  5/12: diagnostic laparoscopy possible laparotomy  Today:    Glucose - 159  Electrolytes -K 3.4, Mag 1.4, CoCa 9.5, all other electrolytes WNL  Renal -WNL  LFTs -WNL  TGs -62  Prealbumin -6.9  NUTRITIONAL GOALS                                                                                             RD recs: 75-85 g/day protein, 1500-1700 Kcal/day. Clinimix E 5/15 at a goal rate of 66ml/hr + 20% fat emulsion at 79ml/hr to provide: 84 g/day protein, 1672 Kcal/day.  PLAN                                                                                                                         At 1800 today:  Continue Clinimix E 5/15 at 35 ml/hr, will advance when  electrolytes stablize  20% fat emulsion at 58ml/hr.  KCl x 4, Mag Sulfate 2gm IV x 1  Plan to advance as tolerated to the goal rate.  TPN to contain standard multivitamins and trace elements.  Change  IVF to 1/2NS [facility] @ 42ml/hr.  Continue sensitive SSI .   BMet, mag and phos with am labs  TPN lab panels on Mondays & Thursdays.  F/u daily.   Arley Phenix RPh 03/24/2015, 9:44 AM Pager [phone]

## 2015-03-24 NOTE — Brief Op Note (Signed)
03/18/2015 - 03/24/2015  2:06 PM  PATIENT:  [name]  81 y.o. female  PRE-OPERATIVE DIAGNOSIS:  small bowel obstruction  POST-OPERATIVE DIAGNOSIS:  small bowel obstruction, adhesions  PROCEDURE:  Procedure(s): LAPAROSCOPY DIAGNOSTIC (N/A) LAPAROSCOPIC LYSIS OF ADHESIONS (N/A) LAPAROTOMY with decompression of bowel (N/A)  SURGEON:  Surgeon(s) and Role:    * Luretha Murphy, MD - Primary  PHYSICIAN ASSISTANT:   ASSISTANTS: none   ANESTHESIA:   general  EBL:  Total I/O In: 1450 [I.V.:1200; IV Piggyback:250] Out: 950 [Urine:450; Drains:450; Blood:50]  BLOOD ADMINISTERED:none  DRAINS: (1) Jackson-Pratt drain(s) with closed bulb suction in the pelvis   LOCAL MEDICATIONS USED:  NONE  SPECIMEN:  No Specimen  DISPOSITION OF SPECIMEN:  N/A  COUNTS:  YES  TOURNIQUET:  * No tourniquets in log *  DICTATION: .Other Dictation: Dictation Number [id number]  PLAN OF CARE: Admit to inpatient   PATIENT DISPOSITION:  PACU - hemodynamically stable.   Delay start of Pharmacological VTE agent (>24hrs) due to surgical blood loss or risk of bleeding: no

## 2015-03-24 NOTE — Anesthesia Preprocedure Evaluation (Signed)
Anesthesia Evaluation  Patient identified by MRN, date of birth, ID band Patient awake    Reviewed: Allergy & Precautions, NPO status , Patient's Chart, lab work & pertinent test results  Airway Mallampati: II   Neck ROM: full    Dental   Pulmonary shortness of breath,  breath sounds clear to auscultation        Cardiovascular hypertension, + CAD, + Cardiac Stents, + Peripheral Vascular Disease and +CHF Rhythm:regular Rate:Normal     Neuro/Psych PSYCHIATRIC DISORDERS Anxiety Depression    GI/Hepatic GERD-  ,SBO   Endo/Other  diabetes, Type 2  Renal/GU      Musculoskeletal   Abdominal   Peds  Hematology   Anesthesia Other Findings   Reproductive/Obstetrics                             Anesthesia Physical Anesthesia Plan  ASA: III  Anesthesia Plan: General   Post-op Pain Management:    Induction: Intravenous  Airway Management Planned: Oral ETT  Additional Equipment:   Intra-op Plan:   Post-operative Plan: Possible Post-op intubation/ventilation  Informed Consent: I have reviewed the patients History and Physical, chart, labs and discussed the procedure including the risks, benefits and alternatives for the proposed anesthesia with the patient or authorized representative who has indicated his/her understanding and acceptance.     Plan Discussed with: CRNA, Anesthesiologist and Surgeon  Anesthesia Plan Comments:         Anesthesia Quick Evaluation

## 2015-03-24 NOTE — Progress Notes (Signed)
PT Cancellation Note  Patient Details Name: [name] MRN: [id number] DOB: 1933/08/29   Cancelled Treatment:    Reason Eval/Treat Not Completed: Patient at procedure or test/unavailable--pt having surgery on today. Will hold PT and check back another day. Thanks.    Rebeca Alert, MPT Pager: [phone]

## 2015-03-24 NOTE — Interval H&P Note (Signed)
History and Physical Interval Note:  03/24/2015 10:12 AM  [name]  has presented today for surgery, with the diagnosis of small bowel obstruction  The various methods of treatment have been discussed with the patient and family. After consideration of risks, benefits and other options for treatment, the patient has consented to  Procedure(s): LAPAROSCOPY DIAGNOSTIC POSSIBLE LAPAROTOMY (N/A) as a surgical intervention .  The patient's history has been reviewed, patient examined, no change in status, stable for surgery.  I have reviewed the patient's chart and labs.  Questions were answered to the patient's satisfaction.     Adair Lauderback B

## 2015-03-25 ENCOUNTER — Encounter (HOSPITAL_COMMUNITY): Payer: Self-pay | Admitting: Physician Assistant

## 2015-03-25 ENCOUNTER — Inpatient Hospital Stay (HOSPITAL_COMMUNITY): Payer: Commercial Managed Care - HMO

## 2015-03-25 ENCOUNTER — Inpatient Hospital Stay (HOSPITAL_COMMUNITY)
Admit: 2015-03-25 | Discharge: 2015-03-25 | Disposition: A | Discharge status: Home / Self Care | Payer: Commercial Managed Care - HMO | Attending: Pulmonary Disease | Admitting: Pulmonary Disease

## 2015-03-25 DIAGNOSIS — R6520 Severe sepsis without septic shock: Secondary | ICD-10-CM

## 2015-03-25 DIAGNOSIS — E872 Acidosis: Secondary | ICD-10-CM

## 2015-03-25 DIAGNOSIS — A419 Sepsis, unspecified organism: Secondary | ICD-10-CM

## 2015-03-25 DIAGNOSIS — G9340 Encephalopathy, unspecified: Secondary | ICD-10-CM

## 2015-03-25 DIAGNOSIS — N179 Acute kidney failure, unspecified: Secondary | ICD-10-CM

## 2015-03-25 DIAGNOSIS — J80 Acute respiratory distress syndrome: Secondary | ICD-10-CM

## 2015-03-25 DIAGNOSIS — R609 Edema, unspecified: Secondary | ICD-10-CM

## 2015-03-25 DIAGNOSIS — K659 Peritonitis, unspecified: Secondary | ICD-10-CM

## 2015-03-25 LAB — BASIC METABOLIC PANEL
Anion gap: 12 (ref 5–15)
BUN: 29 mg/dL — ABNORMAL HIGH (ref 6–20)
CALCIUM: 7.7 mg/dL — AB (ref 8.9–10.3)
CO2: 19 mmol/L — AB (ref 22–32)
CREATININE: 2.2 mg/dL — AB (ref 0.44–1.00)
Chloride: 99 mmol/L — ABNORMAL LOW (ref 101–111)
GFR calc Af Amer: 23 mL/min — ABNORMAL LOW (ref >60)
GFR, EST NON AFRICAN AMERICAN: 20 mL/min — AB (ref >60)
GLUCOSE: 154 mg/dL — AB (ref 65–99)
Potassium: 5 mmol/L (ref 3.5–5.1)
Sodium: 130 mmol/L — ABNORMAL LOW (ref 135–145)

## 2015-03-25 LAB — CBC
HCT: 31.7 % — ABNORMAL LOW (ref 36.0–46.0)
Hemoglobin: 10.2 g/dL — ABNORMAL LOW (ref 12.0–15.0)
MCH: 29.6 pg (ref 26.0–34.0)
MCHC: 32.2 g/dL (ref 30.0–36.0)
MCV: 91.9 fL (ref 78.0–100.0)
PLATELETS: 182 10*3/uL (ref 150–400)
RBC: 3.45 MIL/uL — AB (ref 3.87–5.11)
RDW: 12.8 % (ref 11.5–15.5)
WBC: 24.8 10*3/uL — ABNORMAL HIGH (ref 4.0–10.5)

## 2015-03-25 LAB — SODIUM, URINE, RANDOM: Sodium, Ur: 23 mEq/L

## 2015-03-25 LAB — DIFFERENTIAL
BASOS PCT: 0 % (ref 0–1)
Basophils Absolute: 0 10*3/uL (ref 0.0–0.1)
Eosinophils Absolute: 0.2 10*3/uL (ref 0.0–0.7)
Eosinophils Relative: 1 % (ref 0–5)
LYMPHS ABS: 0.7 10*3/uL (ref 0.7–4.0)
LYMPHS PCT: 3 % — AB (ref 12–46)
Monocytes Absolute: 0 10*3/uL — ABNORMAL LOW (ref 0.1–1.0)
Monocytes Relative: 0 % — ABNORMAL LOW (ref 3–12)
NEUTROS ABS: 23.9 10*3/uL — AB (ref 1.7–7.7)
Neutrophils Relative %: 96 % — ABNORMAL HIGH (ref 43–77)

## 2015-03-25 LAB — URINE MICROSCOPIC-ADD ON

## 2015-03-25 LAB — BLOOD GAS, ARTERIAL
Acid-base deficit: 3.6 mmol/L — ABNORMAL HIGH (ref 0.0–2.0)
BICARBONATE: 20 meq/L (ref 20.0–24.0)
Drawn by: 331471
O2 Content: 2 L/min
O2 SAT: 97 %
PCO2 ART: 32.8 mmHg — AB (ref 35.0–45.0)
PO2 ART: 103 mmHg — AB (ref 80.0–100.0)
Patient temperature: 98.6
TCO2: 18.6 mmol/L (ref 0–100)
pH, Arterial: 7.403 (ref 7.350–7.450)

## 2015-03-25 LAB — URINALYSIS, ROUTINE W REFLEX MICROSCOPIC
Bilirubin Urine: NEGATIVE
Glucose, UA: NEGATIVE mg/dL
Hgb urine dipstick: NEGATIVE
Ketones, ur: NEGATIVE mg/dL
LEUKOCYTES UA: NEGATIVE
Nitrite: NEGATIVE
Protein, ur: 30 mg/dL — AB
Specific Gravity, Urine: 1.023 (ref 1.005–1.030)
Urobilinogen, UA: 0.2 mg/dL (ref 0.0–1.0)
pH: 5 (ref 5.0–8.0)

## 2015-03-25 LAB — PHOSPHORUS: Phosphorus: 3.4 mg/dL (ref 2.5–4.6)

## 2015-03-25 LAB — GLUCOSE, CAPILLARY
GLUCOSE-CAPILLARY: 137 mg/dL — AB (ref 65–99)
Glucose-Capillary: 111 mg/dL — ABNORMAL HIGH (ref 65–99)
Glucose-Capillary: 148 mg/dL — ABNORMAL HIGH (ref 65–99)
Glucose-Capillary: 114 mg/dL — ABNORMAL HIGH (ref 65–99)
Glucose-Capillary: 141 mg/dL — ABNORMAL HIGH (ref 65–99)

## 2015-03-25 LAB — TROPONIN I
TROPONIN I: 0.03 ng/mL (ref <0.031)
Troponin I: 0.03 ng/mL (ref <0.031)

## 2015-03-25 LAB — MAGNESIUM: MAGNESIUM: 1.6 mg/dL — AB (ref 1.7–2.4)

## 2015-03-25 LAB — PROCALCITONIN: PROCALCITONIN: 147.1 ng/mL

## 2015-03-25 LAB — TRIGLYCERIDES: TRIGLYCERIDES: 93 mg/dL (ref <150)

## 2015-03-25 LAB — LACTIC ACID, PLASMA
LACTIC ACID, VENOUS: 2.2 mmol/L — AB (ref 0.5–2.0)
Lactic Acid, Venous: 2.3 mmol/L (ref 0.5–2.0)

## 2015-03-25 LAB — CREATININE, URINE, RANDOM: Creatinine, Urine: 112.6 mg/dL

## 2015-03-25 MED ORDER — DEXTROSE 5 % IV SOLN: 1.0000 g | Freq: Three times a day (TID) | INTRAVENOUS | Status: DC

## 2015-03-25 MED ORDER — FENTANYL CITRATE (PF) 100 MCG/2ML IJ SOLN
25.0000 ug | INTRAMUSCULAR | Status: DC | PRN
  Administered 2015-03-25: 25 ug via INTRAVENOUS
  Administered 2015-03-25 (×2): 50 ug via INTRAVENOUS
  Administered 2015-03-25 (×2): 25 ug via INTRAVENOUS
  Administered 2015-03-25 – 2015-04-10 (×62): 50 ug via INTRAVENOUS
  Filled 2015-03-25 (×70): qty 2

## 2015-03-25 MED ORDER — CHLORHEXIDINE GLUCONATE 0.12 % MT SOLN
15.0000 mL | Freq: Two times a day (BID) | OROMUCOSAL | Status: DC
  Administered 2015-03-25 – 2015-04-20 (×43): 15 mL via OROMUCOSAL
  Filled 2015-03-25 (×41): qty 15

## 2015-03-25 MED ORDER — MAGNESIUM SULFATE 2 GM/50ML IV SOLN
2.0000 g | Freq: Once | INTRAVENOUS | Status: AC
  Administered 2015-03-25: 2 g via INTRAVENOUS
  Filled 2015-03-25: qty 50

## 2015-03-25 MED ORDER — LACTATED RINGERS IV SOLN: INTRAVENOUS | Status: DC

## 2015-03-25 MED ORDER — DEXTROSE-NACL 5-0.45 % IV SOLN
INTRAVENOUS | Status: DC
  Administered 2015-03-25: 08:00:00 via INTRAVENOUS

## 2015-03-25 MED ORDER — ALTEPLASE 2 MG IJ SOLR
2.0000 mg | Freq: Once | INTRAMUSCULAR | Status: AC
  Administered 2015-03-25: 2 mg
  Filled 2015-03-25: qty 2

## 2015-03-25 MED ORDER — FAT EMULSION 20 % IV EMUL
240.0000 mL | INTRAVENOUS | Status: AC
  Administered 2015-03-25: 240 mL via INTRAVENOUS
  Filled 2015-03-25: qty 250

## 2015-03-25 MED ORDER — LACTATED RINGERS IV SOLN: INTRAVENOUS | Status: AC

## 2015-03-25 MED ORDER — SODIUM CHLORIDE 0.9 % IV SOLN
Freq: Once | INTRAVENOUS | Status: AC
  Administered 2015-03-25: 500 mL via INTRAVENOUS

## 2015-03-25 MED ORDER — CETYLPYRIDINIUM CHLORIDE 0.05 % MT LIQD
7.0000 mL | Freq: Two times a day (BID) | OROMUCOSAL | Status: DC
  Administered 2015-03-25 – 2015-04-19 (×41): 7 mL via OROMUCOSAL

## 2015-03-25 MED ORDER — DEXTROSE 5 % IV SOLN
1.0000 g | Freq: Two times a day (BID) | INTRAVENOUS | Status: DC
  Administered 2015-03-25 – 2015-03-26 (×2): 1 g via INTRAVENOUS
  Filled 2015-03-25 (×3): qty 1

## 2015-03-25 MED ORDER — LACTATED RINGERS IV SOLN
INTRAVENOUS | Status: DC
  Administered 2015-03-25: 11:00:00 via INTRAVENOUS

## 2015-03-25 MED ORDER — TRACE MINERALS CR-CU-F-FE-I-MN-MO-SE-ZN IV SOLN
INTRAVENOUS | Status: AC
  Administered 2015-03-25: 18:00:00 via INTRAVENOUS
  Filled 2015-03-25: qty 1680

## 2015-03-25 NOTE — Progress Notes (Signed)
Patient Name: [name] Date of Encounter: 03/25/2015  Principal Problem:   Small bowel obstruction Active Problems:   Diabetes type 2, controlled   Adjustment disorder with mixed anxiety and depressed mood   Coronary atherosclerosis   Abdominal pain   Knee pain, bilateral   Elevated blood pressure   Malnutrition of moderate degree   Primary Cardiologist: Dr Antoine Poche  Patient Profile: 81 yo female w/ hx CAD, stent to LAD 1999 (patent stent and non-obs dz, nl EF by cath 2008), HTN, HL, depression, admitted 05/06 w/ SBO. Cards saw pre-op before lap surgery.  SUBJECTIVE: Very weak, c/o abd pain, denies SOB or chest pain.   OBJECTIVE Filed Vitals:   03/24/15 2124 03/24/15 2346 03/25/15 0037 03/25/15 0500  BP: 99/49 119/49 107/58 102/66  Pulse: 111  115 117  Temp: 98.6 F (37 C)  97.7 F (36.5 C) 97.7 F (36.5 C)  TempSrc: Oral  Oral Oral  Resp: 18   18  Height:      Weight:      SpO2: 99%  100% 100%    Intake/Output Summary (Last 24 hours) at 03/25/15 0752 Last data filed at 03/25/15 0539  Gross per 24 hour  Intake 3640.57 ml  Output   1640 ml  Net 2000.57 ml   Filed Weights   03/18/15 0700  Weight: 141 lb (63.957 kg)    PHYSICAL EXAM General: Well developed, well nourished, female in no acute distress. Head: Normocephalic, atraumatic.  Neck: Supple without bruits, JVD 8 cm. Lungs:  Resp regular and unlabored, few rales bases. Heart: Rapid, regular R&R, S1, S2, no S3, S4, or murmur; no rub. Abdomen: Soft, tender, no BS noted, dressings not disturbed.  Extremities: No clubbing, cyanosis, edema.  Neuro: A little sleepy from pain meds and oriented X 2. Moves all extremities spontaneously.  LABS: CBC: Recent Labs  03/24/15 0533 03/25/15 0615  WBC 6.1 24.8*  NEUTROABS 4.6 23.9*  HGB 9.1* 10.2*  HCT 29.2* 31.7*  MCV 92.7 91.9  PLT 247 182   Basic Metabolic Panel: Recent Labs  03/24/15 0533 03/25/15 0615  NA 136 130*  K 3.4* 5.0  CL  99* 99*  CO2 27 19*  GLUCOSE 159* 154*  BUN 22* 29*  CREATININE 0.85 2.20*  CALCIUM 8.4* 7.7*  MG 1.4* 1.6*  PHOS 3.6 3.4   Liver Function Tests: Recent Labs  03/24/15 0533  AST 17  ALT 10*  ALKPHOS 63  BILITOT 0.5  PROT 6.6  ALBUMIN 2.6*   Fasting Lipid Panel: Recent Labs  03/24/15 0533  TRIG 62    Radiology/Studies: Dg Abd Portable 1v 03/24/2015   CLINICAL DATA:  Small-bowel obstruction  EXAM: PORTABLE ABDOMEN - 1 VIEW  COMPARISON:  03/23/2015  FINDINGS: NG tube is been placed with the tip in the gastric antrum  Small bowel dilatation shows mild interval improvement. Colon is decompressed with moderate stool in the right colon. Surgical clips in the gallbladder fossa.  IMPRESSION: Mild improvement in small bowel obstruction. NG tube in the gastric antrum.   Electronically Signed   By: Marlan Palau M.D.   On: 03/24/2015 08:25   Dg Abd Portable 1v 03/23/2015   CLINICAL DATA:  Followup small bowel obstruction.  EXAM: PORTABLE ABDOMEN - 1 VIEW  COMPARISON:  03/22/2015  FINDINGS: Dilated loops small bowel are similar to the prior exam allowing for differences in radiographic technique different degrees of magnification.  There is a small amount of air in a nondistended  colon.  IMPRESSION: 1. Persistent high-grade partial small bowel obstruction. No significant change from the previous day's study.   Electronically Signed   By: Amie Portland M.D.   On: 03/23/2015 10:01     Current Medications:  . acetaminophen  1,000 mg Intravenous 4 times per day  . cefOXitin  1 g Intravenous 3 times per day  . heparin  5,000 Units Subcutaneous 3 times per day  . insulin aspart  0-15 Units Subcutaneous 6 times per day   . dextrose 5 % and 0.45% NaCl    . Marland KitchenTPN (CLINIMIX-E) Adult 35 mL/hr at 03/24/15 1808   And  . fat emulsion 240 mL (03/24/15 1801)    ASSESSMENT AND PLAN: Principal Problem:   Small bowel obstruction - per CCS, for tx stepdown    Hx CAD - no ongoing ischemic sx - EF  62% by MV 2013, no scar or ischemia - call w/ questions    ARI  - I/O + by 2 L yesterday - BUN/Cr 22/.85>>29/2.20 - recheck per CCS  Otherwise, per CCS, agree w/ tx stepdown for close monitoring. Active Problems:   Diabetes type 2, controlled   Adjustment disorder with mixed anxiety and depressed mood   Coronary atherosclerosis   Abdominal pain   Knee pain, bilateral   Elevated blood pressure   Malnutrition of moderate degree   Signed, Barrett, Deneen Harts 7:52 AM 03/25/2015   Attending Note:   The patient was seen and examined.  Agree with assessment and plan as noted above.  Changes made to the above note as needed.  Patient is hemodynamically stable.  Sleepy this am.  No active cardiac issues. Tele  - NSR  No further recs.  Will sign off.  Call for questions    Alvia Grove., MD, [facility] 03/25/2015, 11:28 AM [address],  [address] Office [phone] Pager [phone]

## 2015-03-25 NOTE — Consult Note (Signed)
PULMONARY / CRITICAL CARE MEDICINE   Name: DICKIE DEVALLE MRN: [id number] DOB: November 10, 1933    ADMISSION DATE:  03/18/2015 CONSULTATION DATE:  03/25/2015  REFERRING MD :  Elisabeth Pigeon  CHIEF COMPLAINT:  Sepsis  INITIAL PRESENTATION:  81 yo female with N/V, abdominal cramps and distention from SBO.  She had laparotomy 5/12.  She developed AKI, altered mental status 5/13 and transferred to SDU.  STUDIES:  5/06 CT abd/pelvis >> SBO, moderate stool in Rt colon and transverse colon  SIGNIFICANT EVENTS: 5/06 Admit, surgery consulted 5/09 altered mental status from benzo's, narcotics 5/10 naprosyn for rt foot pain ?gout 5/11 cardiology consulted for pre-op assessment; started TNA 5/12 Laparotomy, lysis of adhesions, bowel decompression 5/13 To SDU   HISTORY OF PRESENT ILLNESS:   81 yo female presented to [facility] with N/V abdominal pain and cramps.  Found to have SBO.  She was seen by surgery.  She had NG tube placed.  She was tried on conservative management.  She had pulled out her NG tube.  She was tried on laxative therapy.  She did not have improvement.  She had developed pain in her Rt foot and was tx with naprosyn.  Her abdominal exam got worse, and she was having vomiting.  She was started on TNA for nutritional support.  She required laparotomy with lysis of adhesions.  Post-op she had worsening mental status.  She was found to have AKI.  As a result she was transferred to SDU.  She has been getting pain medication every hour.  She c/o sore throat from NG tube.  She feels her breathing is labored.  She denies chest pain.  She is not having foot pain.  PAST MEDICAL HISTORY :   has a past medical history of CAD (coronary artery disease); HTN (hypertension); Hyperlipemia; Anemia; Depression; DVT (deep venous thrombosis); Gout; Osteoporosis; Pancreatitis; GERD (gastroesophageal reflux disease); Renal insufficiency; Obesity; Diabetes mellitus; Polyarthritis; Vitamin D deficiency; B12 deficiency;  Tinnitus; Anxiety; Aneurysm, thoracic aortic; Constipation; Urinary frequency; Vertigo; and Chronic back pain.  has past surgical history that includes Hemorrhoid surgery; Abdominal hysterectomy; Cholecystectomy; Tubal ligation; Coronary angioplasty with stent (1999); and Cardiac catheterization (2008). Prior to Admission medications   Medication Sig Start Date End Date Taking? Authorizing Provider  acetaminophen (TYLENOL) 650 MG CR tablet Take 1,300 mg by mouth every 8 (eight) hours as needed for pain.    Yes Historical Provider, MD  aspirin 325 MG tablet Take 325 mg by mouth daily.   Yes Historical Provider, MD  cholecalciferol (VITAMIN D) 1000 UNITS tablet Take 2 tablets (2,000 Units total) by mouth daily. 04/17/13  Yes Aleksei Plotnikov V, MD  Cyanocobalamin (VITAMIN B-12) 1000 MCG SUBL Place 1 tablet (1,000 mcg total) under the tongue daily. 12/03/13  Yes Aleksei Plotnikov V, MD  furosemide (LASIX) 20 MG tablet Take 1 tablet (20 mg total) by mouth daily as needed for edema. Patient taking differently: Take 20 mg by mouth daily.  03/10/15 03/09/16 Yes Aleksei Plotnikov V, MD  metFORMIN (GLUCOPHAGE) 500 MG tablet TAKE 1 TABLET TWICE DAILY  WITH  A  MEAL 08/23/14  Yes Aleksei Plotnikov V, MD  OVER THE COUNTER MEDICATION Apply 1 application topically 2 (two) times daily as needed (dryness). Diabetic Lotion.   Yes Historical Provider, MD  oxybutynin (DITROPAN) 5 MG tablet Take 1 tablet (5 mg total) by mouth 2 (two) times daily. 11/25/14  Yes Aleksei Plotnikov V, MD  Polyethyl Glycol-Propyl Glycol (SYSTANE OP) Apply 1-2 drops to eye daily as needed (  dry eyes).   Yes Historical Provider, MD  LORazepam (ATIVAN) 1 MG tablet Take 0.5 tablets (0.5 mg total) by mouth every 8 (eight) hours as needed for anxiety. Patient not taking: Reported on 03/18/2015 05/07/13   Bethann Berkshire, MD   Allergies  Allergen Reactions  . Amlodipine Besylate     REACTION: dizzy  . Aspirin   . Atenolol     REACTION: fatigue  .  Benazepril     cough  . Benicar [Olmesartan Medoxomil]     HA  . Cozaar     nausea  . Hydrochlorothiazide W-Triamterene     REACTION: dizzy  . Hydrocodone     REACTION: HA  . Hydroxyzine Pamoate   . Iodine   . Lisinopril     REACTION: tired, cough  . Penicillins Itching  . Pravastatin     myalgias  . Prednisolone     My stomach hurts  . Tramadol Hcl     REACTION: HA    FAMILY HISTORY:  is adopted. SOCIAL HISTORY:  reports that she has never smoked. She does not have any smokeless tobacco history on file. She reports that she does not drink alcohol or use illicit drugs.  REVIEW OF SYSTEMS:   Negative except above  SUBJECTIVE:   VITAL SIGNS: Temp:  [97.7 F (36.5 C)-98.7 F (37.1 C)] 98.7 F (37.1 C) (05/13 0900) Pulse Rate:  [101-117] 117 (05/13 0920) Resp:  [17-24] 24 (05/13 0920) BP: (99-176)/(44-86) 138/70 mmHg (05/13 0920) SpO2:  [97 %-100 %] 100 % (05/13 0920) Weight:  [151 lb 7.3 oz (68.7 kg)] 151 lb 7.3 oz (68.7 kg) (05/13 0900) INTAKE / OUTPUT:  Intake/Output Summary (Last 24 hours) at 03/25/15 0949 Last data filed at 03/25/15 0900  Gross per 24 hour  Intake 3640.57 ml  Output   2040 ml  Net 1600.57 ml    PHYSICAL EXAMINATION: General:  Ill appearing Neuro:  Somnolent, wakes up easily, A&O x 3, follows commands, moves all extremities HEENT:  Pupils reactive, dry oral mucosa, no LAN Cardiovascular:  Regular, tachycardic Lungs:  Basilar crackles, decreased BS, no wheeze Abdomen:  Soft, mild tenderness, wound dressing clean, JP drain in place Musculoskeletal:  1+ edema Skin:  No rashes  LABS:  CBC  Recent Labs Lab 03/23/15 1000 03/24/15 0533 03/25/15 0615  WBC 7.5 6.1 24.8*  HGB 11.2* 9.1* 10.2*  HCT 35.9* 29.2* 31.7*  PLT 309 247 182   BMET  Recent Labs Lab 03/23/15 1045 03/24/15 0533 03/25/15 0615  NA 137 136 130*  K 3.9 3.4* 5.0  CL 102 99* 99*  CO2 22 27 19*  BUN 21* 22* 29*  CREATININE 0.87 0.85 2.20*  GLUCOSE 137*  159* 154*   Electrolytes  Recent Labs Lab 03/23/15 1045 03/24/15 0533 03/25/15 0615  CALCIUM 8.7* 8.4* 7.7*  MG  --  1.4* 1.6*  PHOS  --  3.6 3.4   Sepsis Markers No results for input(s): LATICACIDVEN, PROCALCITON, O2SATVEN in the last 168 hours.   ABG No results for input(s): PHART, PCO2ART, PO2ART in the last 168 hours.   Liver Enzymes  Recent Labs Lab 03/24/15 0533  AST 17  ALT 10*  ALKPHOS 63  BILITOT 0.5  ALBUMIN 2.6*   Cardiac Enzymes No results for input(s): TROPONINI, PROBNP in the last 168 hours.   Glucose  Recent Labs Lab 03/24/15 0736 03/24/15 1650 03/24/15 2006 03/24/15 2354 03/25/15 0356 03/25/15 0759  GLUCAP 115* 153* 178* 150* 137* 111*    Imaging Dg  Abd Portable 1v  03/24/2015   CLINICAL DATA:  Small-bowel obstruction  EXAM: PORTABLE ABDOMEN - 1 VIEW  COMPARISON:  03/23/2015  FINDINGS: NG tube is been placed with the tip in the gastric antrum  Small bowel dilatation shows mild interval improvement. Colon is decompressed with moderate stool in the right colon. Surgical clips in the gallbladder fossa.  IMPRESSION: Mild improvement in small bowel obstruction. NG tube in the gastric antrum.   Electronically Signed   By: Marlan Palau M.D.   On: 03/24/2015 08:25     ASSESSMENT / PLAN:  PULMONARY A: Acute respiratory distress in setting of AKI and acidosis. Atelectasis. P:   F/u CXR, ABG Bronchial hygiene Oxygen to keep SpO2 > 92%  CARDIOVASCULAR Lt PICC 5/11 >>  A:  Sinus tachycardia. Hx of CAD, HTN, HLD, DVT, TAA. P:  Monitor hemodynamics Hold outpt ASA, lasix Doppler u/s legs 5/13  RENAL A:   AKI likely from hypovolemia and NSAIDs. Non anion gap metabolic acidosis. Hyponatremia. Hx of Stage 2 CKD. P:   Check FeNa, U/A and microscopy  F/u ABG Monitor renal x, urine outpt Check renal u/s 5/13 Continue IV fluids  GASTROINTESTINAL A:   SBO s/p lysis of adhesions. Nutrition. P:   Post-op care, nutrition per primary  team and CCS  HEMATOLOGIC A:   Leukocytosis. Anemia of critical illness. P:  F/u CBC SQ heparin for DVT prevention  INFECTIOUS A:   Possible sepsis >> possible sources are intra-abdominal, urine. P:   Day 2 cefoxitin F/u lactic acid, procalcitonin  Blood 5/13 >> Urine 5/13 >>   ENDOCRINE A:   DM type II with renal complications. P:   SSI Hold outpt metformin  NEUROLOGIC A:   Acute encephalopathy 2nd to AKI, acidosis, pain medications. Hx of depression. P:   Limit pain medications as tolerated Monitor mental status  Updated pt's daughter at bedside.  CC time 45 minutes.  Coralyn Helling, MD [facility] Pulmonary/Critical Care 03/25/2015, 10:29 AM Pager:  [phone] After 3pm call: [phone]

## 2015-03-25 NOTE — Progress Notes (Signed)
CSW received phone call from pt RN stating that pt transferred down this morning and pt daughter present in room asking to speak with CSW as pt daughter emotional about pt current condition and pt husband who has dementia and is at home alone.   CSW reviewed chart and noted that ED CSW met with pt daughter on 03/18/2015 and provided support surrounding pt husband and provided pt daughter resources. Psychosocial assessment completed by ED CSW 5/6.  CSW met with pt daughter in family consult room. CSW introduced self and explained role. CSW provided support to pt daughter as pt daughter discussed her feelings surrounding pt current medical condition. Pt daughter discussed that she is also concerned about pt husband whom pt was primary caregiver for at home and pt husband has dementia and is blind. CSW dicussed with pt daughter surrounding resources for pt husband including home health, private duty care, and ALF. Pt daughter states that pt family is unable to pay out of pocket for pt husband care. CSW encouraged pt daughter to contact pt husband PCP and inquire about getting home health in the home to assist and home health can include a Education officer, museum as well. Pt daughter plans to contact pt husband PCP to inquire.   Pt daughter discussed that she is concerned that pt husband will not agree to assistance and will never agree to placement if needed eventually. Pt daughter interested in pursuing legal guardianship. CSW discussed with pt daughter that if she has a lot of concerns about pt husband and if pt husband is alone then it may be beneficial to involve Adult Protective Services in order to assist family with determining best plan for pt husband. CSW provided Adult Protective Services contact information to pt daughter and pt daughter states that she feels that it would be appropriate to involve Adult Protective Services in order to assist with current concerns and assist daughter in obtaining legal  guardianship.   CSW to continue to follow to provide support and continue to assess disposition needs of pt as appropriate.  Alison Murray, MSW, [place] [employer] [phone]

## 2015-03-25 NOTE — Progress Notes (Signed)
PARENTERAL NUTRITION CONSULT NOTE - follow up  Pharmacy Consult for TPN Indication: bowel obstruction  Allergies  Allergen Reactions  . Amlodipine Besylate     REACTION: dizzy  . Aspirin   . Atenolol     REACTION: fatigue  . Benazepril     cough  . Benicar [Olmesartan Medoxomil]     HA  . Cozaar     nausea  . Hydrochlorothiazide W-Triamterene     REACTION: dizzy  . Hydrocodone     REACTION: HA  . Hydroxyzine Pamoate   . Iodine   . Lisinopril     REACTION: tired, cough  . Penicillins Itching  . Pravastatin     myalgias  . Prednisolone     My stomach hurts  . Tramadol Hcl     REACTION: HA    Patient Measurements: Height: 5\' 4"  (162.6 cm) Weight: 151 lb 7.3 oz (68.7 kg) IBW/kg (Calculated) : 54.7 Adjusted Body Weight: 57.5kg   Vital Signs: Temp: 98.7 F (37.1 C) (05/13 0900) Temp Source: Oral (05/13 0900) BP: 123/67 mmHg (05/13 1000) Pulse Rate: 117 (05/13 1000) Intake/Output from previous day: 05/12 0701 - 05/13 0700 In: 3640.6 [I.V.:1300; IV Piggyback:1000; TPN:1340.6] Out: 1640 [Urine:800; Emesis/NG output:275; Drains:515; Blood:50] Intake/Output from this shift: Total I/O In: 761.3 [I.V.:581.3; TPN:180] Out: 490 [Urine:80; Emesis/NG output:400; Drains:10]  Labs:  Recent Labs  03/23/15 1000 03/24/15 0533 03/25/15 0615  WBC 7.5 6.1 24.8*  HGB 11.2* 9.1* 10.2*  HCT 35.9* 29.2* 31.7*  PLT 309 247 182     Recent Labs  03/23/15 1045 03/24/15 0533 03/25/15 0615  NA 137 136 130*  K 3.9 3.4* 5.0  CL 102 99* 99*  CO2 22 27 19*  GLUCOSE 137* 159* 154*  BUN 21* 22* 29*  CREATININE 0.87 0.85 2.20*  CALCIUM 8.7* 8.4* 7.7*  MG  --  1.4* 1.6*  PHOS  --  3.6 3.4  PROT  --  6.6  --   ALBUMIN  --  2.6*  --   AST  --  17  --   ALT  --  10*  --   ALKPHOS  --  63  --   BILITOT  --  0.5  --   PREALBUMIN  --  6.9*  --   TRIG  --  62 93   Estimated Creatinine Clearance: 19.7 mL/min (by C-G formula based on Cr of 2.2).    Recent Labs   03/24/15 2354 03/25/15 0356 03/25/15 0759  GLUCAP 150* 137* 111*    Medical History: Past Medical History  Diagnosis Date  . CAD (coronary artery disease)     s/p stenting of LAD 1999- cath 5-08 EF normal LAD 30-40% restenosis. D1 50% D2 80% LCX & RCA minimal plaque  . HTN (hypertension)   . Hyperlipemia   . Anemia     iron deficiency  . Depression   . DVT (deep venous thrombosis)   . Gout   . Osteoporosis   . Pancreatitis   . GERD (gastroesophageal reflux disease)   . Renal insufficiency     Cr 1.2-1.3  . Obesity   . Diabetes mellitus   . Polyarthritis     DJD/ possible PMR  . Vitamin D deficiency   . B12 deficiency   . Tinnitus   . Anxiety   . Aneurysm, thoracic aortic   . Constipation   . Urinary frequency   . Vertigo   . Chronic back pain       Insulin Requirements:  8 units on 5/12  Current Nutrition: NPO  IVF: LR @ 100 ml/hr  Central access: PICC 5/11 TPN start date: 5/11  ASSESSMENT                                                                                                          HPI: 81 year old female with history of hypertension, coronary artery disease, hyperlipidemia, iron deficiency anemia, depression, osteoporosis, mild chronic kidney disease presented to the ED with ongoing nausea for 4 days and diffuse crampy lower abdominal pain since one day prior to admission. Associated poor by mouth intake and generalized weakness. Patient was found to have small bowel obstruction.  Pharmacy consulted to start TPN.  Significant events:  5/12: diagnostic laparoscopy possible laparotomy 5/13: new AKI  Today:   Glucose - CBGs mostly < 150 with SSI  Electrolytes - K now borderline high (AKI?), Mg improved but still low, Na trending down - sl low, Phos wnl  Renal - new AKI, K up  LFTs - WNL  TGs - wnl but increasing  Prealbumin low  NUTRITIONAL GOALS                                                                                              RD recs: 75-85 g/day protein, 1500-1700 Kcal/day. Clinimix E 5/15 at a goal rate of 48ml/hr + 20% fat emulsion at 49ml/hr to provide: 84 g/day protein, 1672 Kcal/day.  PLAN                                                                                                                         At 1800 today:  Advance Clinimix 5/15 to to goal rate of 70 ml/hr but will switch to plain (no lytes) formulation with new AKI and K+ now 5.0  Continue 20% fat emulsion at 35ml/hr.  TPN to contain standard multivitamins and trace elements.  Reduce LR to 65 ml/hr (will supply Na, minimal K)  MD changed to mod SSI - agree w/ TPN rate increasing, few high CBGs  Will give 2g Mg IV x 1; no other lytes  BMet, mag and phos with am labs  TPN lab panels  on Mondays & Thursdays.  F/u daily.  Bernadene Person, PharmD Pager: [phone] 03/25/2015, 11:00 AM

## 2015-03-25 NOTE — Progress Notes (Signed)
Patient ID: [name], female   DOB: 17-Jan-1933, 81 y.o.   MRN: [id number]   [facility] Surgery Progress Note:   1 Day Post-Op  Subjective: Mental status is about the same.  Answers questions.   Objective: Vital signs in last 24 hours: Temp:  [97.7 F (36.5 C)-98.6 F (37 C)] 97.7 F (36.5 C) (05/13 0500) Pulse Rate:  [101-117] 117 (05/13 0500) Resp:  [17-24] 18 (05/13 0500) BP: (99-176)/(44-86) 102/66 mmHg (05/13 0500) SpO2:  [97 %-100 %] 100 % (05/13 0500)  Intake/Output from previous day: 05/12 0701 - 05/13 0700 In: 3640.6 [I.V.:1300; IV Piggyback:1000; TPN:1340.6] Out: 1640 [Urine:800; Emesis/NG output:275; Drains:515; Blood:50] Intake/Output this shift:    Physical Exam: Work of breathing is not labored. JP serosanguinaous.  Incision covered. tacycardia  Lab Results:  Results for orders placed or performed during the hospital encounter of 03/18/15 (from the past 48 hour(s))  CBC with Differential/Platelet     Status: Abnormal   Collection Time: 03/23/15 10:00 AM  Result Value Ref Range   WBC 7.5 4.0 - 10.5 K/uL   RBC 3.81 (L) 3.87 - 5.11 MIL/uL   Hemoglobin 11.2 (L) 12.0 - 15.0 g/dL   HCT 35.9 (L) 36.0 - 46.0 %   MCV 94.2 78.0 - 100.0 fL   MCH 29.4 26.0 - 34.0 pg   MCHC 31.2 30.0 - 36.0 g/dL   RDW 12.7 11.5 - 15.5 %   Platelets 309 150 - 400 K/uL   Neutrophils Relative % 76 43 - 77 %   Neutro Abs 5.7 1.7 - 7.7 K/uL   Lymphocytes Relative 14 12 - 46 %   Lymphs Abs 1.1 0.7 - 4.0 K/uL   Monocytes Relative 9 3 - 12 %   Monocytes Absolute 0.7 0.1 - 1.0 K/uL   Eosinophils Relative 1 0 - 5 %   Eosinophils Absolute 0.0 0.0 - 0.7 K/uL   Basophils Relative 0 0 - 1 %   Basophils Absolute 0.0 0.0 - 0.1 K/uL  Basic metabolic panel     Status: Abnormal   Collection Time: 03/23/15 10:45 AM  Result Value Ref Range   Sodium 137 135 - 145 mmol/L   Potassium 3.9 3.5 - 5.1 mmol/L   Chloride 102 101 - 111 mmol/L   CO2 22 22 - 32 mmol/L   Glucose, Bld 137 (H) 70 - 99  mg/dL   BUN 21 (H) 6 - 20 mg/dL   Creatinine, Ser 0.87 0.44 - 1.00 mg/dL   Calcium 8.7 (L) 8.9 - 10.3 mg/dL   GFR calc non Af Amer >60 >60 mL/min   GFR calc Af Amer >60 >60 mL/min    Comment: (NOTE) The eGFR has been calculated using the CKD EPI equation. This calculation has not been validated in all clinical situations. eGFR's persistently <60 mL/min signify possible Chronic Kidney Disease.    Anion gap 13 5 - 15  Glucose, capillary     Status: Abnormal   Collection Time: 03/23/15 12:44 PM  Result Value Ref Range   Glucose-Capillary 111 (H) 70 - 99 mg/dL  Glucose, capillary     Status: Abnormal   Collection Time: 03/23/15  8:21 PM  Result Value Ref Range   Glucose-Capillary 125 (H) 70 - 99 mg/dL  Glucose, capillary     Status: Abnormal   Collection Time: 03/23/15 11:40 PM  Result Value Ref Range   Glucose-Capillary 130 (H) 70 - 99 mg/dL  Glucose, capillary     Status: Abnormal   Collection  Time: 03/24/15  4:17 AM  Result Value Ref Range   Glucose-Capillary 144 (H) 65 - 99 mg/dL  Comprehensive metabolic panel     Status: Abnormal   Collection Time: 03/24/15  5:33 AM  Result Value Ref Range   Sodium 136 135 - 145 mmol/L   Potassium 3.4 (L) 3.5 - 5.1 mmol/L   Chloride 99 (L) 101 - 111 mmol/L   CO2 27 22 - 32 mmol/L   Glucose, Bld 159 (H) 65 - 99 mg/dL   BUN 22 (H) 6 - 20 mg/dL   Creatinine, Ser 0.85 0.44 - 1.00 mg/dL   Calcium 8.4 (L) 8.9 - 10.3 mg/dL   Total Protein 6.6 6.5 - 8.1 g/dL   Albumin 2.6 (L) 3.5 - 5.0 g/dL   AST 17 15 - 41 U/L   ALT 10 (L) 14 - 54 U/L   Alkaline Phosphatase 63 38 - 126 U/L   Total Bilirubin 0.5 0.3 - 1.2 mg/dL   GFR calc non Af Amer >60 >60 mL/min   GFR calc Af Amer >60 >60 mL/min    Comment: (NOTE) The eGFR has been calculated using the CKD EPI equation. This calculation has not been validated in all clinical situations. eGFR's persistently <60 mL/min signify possible Chronic Kidney Disease.    Anion gap 10 5 - 15  Prealbumin      Status: Abnormal   Collection Time: 03/24/15  5:33 AM  Result Value Ref Range   Prealbumin 6.9 (L) 18 - 38 mg/dL    Comment: Performed at [facility]  Magnesium     Status: Abnormal   Collection Time: 03/24/15  5:33 AM  Result Value Ref Range   Magnesium 1.4 (L) 1.7 - 2.4 mg/dL  Phosphorus     Status: None   Collection Time: 03/24/15  5:33 AM  Result Value Ref Range   Phosphorus 3.6 2.5 - 4.6 mg/dL  Triglycerides     Status: None   Collection Time: 03/24/15  5:33 AM  Result Value Ref Range   Triglycerides 62 <150 mg/dL    Comment: Performed at [facility]  CBC     Status: Abnormal   Collection Time: 03/24/15  5:33 AM  Result Value Ref Range   WBC 6.1 4.0 - 10.5 K/uL   RBC 3.15 (L) 3.87 - 5.11 MIL/uL   Hemoglobin 9.1 (L) 12.0 - 15.0 g/dL   HCT 29.2 (L) 36.0 - 46.0 %   MCV 92.7 78.0 - 100.0 fL   MCH 28.9 26.0 - 34.0 pg   MCHC 31.2 30.0 - 36.0 g/dL   RDW 12.5 11.5 - 15.5 %   Platelets 247 150 - 400 K/uL  Differential     Status: None   Collection Time: 03/24/15  5:33 AM  Result Value Ref Range   Neutrophils Relative % 75 43 - 77 %   Neutro Abs 4.6 1.7 - 7.7 K/uL   Lymphocytes Relative 12 12 - 46 %   Lymphs Abs 0.7 0.7 - 4.0 K/uL   Monocytes Relative 11 3 - 12 %   Monocytes Absolute 0.7 0.1 - 1.0 K/uL   Eosinophils Relative 2 0 - 5 %   Eosinophils Absolute 0.1 0.0 - 0.7 K/uL   Basophils Relative 0 0 - 1 %   Basophils Absolute 0.0 0.0 - 0.1 K/uL  Glucose, capillary     Status: Abnormal   Collection Time: 03/24/15  7:36 AM  Result Value Ref Range   Glucose-Capillary 115 (H) 65 -  99 mg/dL  MRSA PCR Screening     Status: None   Collection Time: 03/24/15  8:52 AM  Result Value Ref Range   MRSA by PCR NEGATIVE NEGATIVE    Comment:        The GeneXpert MRSA Assay (FDA approved for NASAL specimens only), is one component of a comprehensive MRSA colonization surveillance program. It is not intended to diagnose MRSA infection nor to guide or monitor  treatment for MRSA infections.   Type and screen     Status: None (Preliminary result)   Collection Time: 03/24/15  9:55 AM  Result Value Ref Range   ABO/RH(D) B POS    Antibody Screen POS    Sample Expiration 03/27/2015    Antibody Identification ANTI E    PT AG Type NEGATIVE FOR E ANTIGEN    DAT, IgG NEG    Unit Number [id number]    Blood Component Type RED CELLS,LR    Unit division 00    Status of Unit ALLOCATED    Transfusion Status OK TO TRANSFUSE    Crossmatch Result COMPATIBLE    Donor AG Type NEGATIVE FOR E ANTIGEN    Unit Number [id number]    Blood Component Type RED CELLS,LR    Unit division 00    Status of Unit ALLOCATED    Transfusion Status OK TO TRANSFUSE    Crossmatch Result COMPATIBLE    Donor AG Type NEGATIVE FOR E ANTIGEN   Glucose, capillary     Status: Abnormal   Collection Time: 03/24/15  4:50 PM  Result Value Ref Range   Glucose-Capillary 153 (H) 65 - 99 mg/dL  Glucose, capillary     Status: Abnormal   Collection Time: 03/24/15  8:06 PM  Result Value Ref Range   Glucose-Capillary 178 (H) 65 - 99 mg/dL  Glucose, capillary     Status: Abnormal   Collection Time: 03/24/15 11:54 PM  Result Value Ref Range   Glucose-Capillary 150 (H) 65 - 99 mg/dL  Glucose, capillary     Status: Abnormal   Collection Time: 03/25/15  3:56 AM  Result Value Ref Range   Glucose-Capillary 137 (H) 65 - 99 mg/dL  CBC     Status: Abnormal   Collection Time: 03/25/15  6:15 AM  Result Value Ref Range   WBC 24.8 (H) 4.0 - 10.5 K/uL   RBC 3.45 (L) 3.87 - 5.11 MIL/uL   Hemoglobin 10.2 (L) 12.0 - 15.0 g/dL   HCT 31.7 (L) 36.0 - 46.0 %   MCV 91.9 78.0 - 100.0 fL   MCH 29.6 26.0 - 34.0 pg   MCHC 32.2 30.0 - 36.0 g/dL   RDW 12.8 11.5 - 15.5 %   Platelets 182 150 - 400 K/uL  Basic metabolic panel     Status: Abnormal   Collection Time: 03/25/15  6:15 AM  Result Value Ref Range   Sodium 130 (L) 135 - 145 mmol/L   Potassium 5.0 3.5 - 5.1 mmol/L    Comment: DELTA CHECK  NOTED REPEATED TO VERIFY NO VISIBLE HEMOLYSIS    Chloride 99 (L) 101 - 111 mmol/L   CO2 19 (L) 22 - 32 mmol/L   Glucose, Bld 154 (H) 65 - 99 mg/dL   BUN 29 (H) 6 - 20 mg/dL   Creatinine, Ser 2.20 (H) 0.44 - 1.00 mg/dL    Comment: DELTA CHECK NOTED REPEATED TO VERIFY    Calcium 7.7 (L) 8.9 - 10.3 mg/dL   GFR calc non Af Amer 20 (  L) >60 mL/min   GFR calc Af Amer 23 (L) >60 mL/min    Comment: (NOTE) The eGFR has been calculated using the CKD EPI equation. This calculation has not been validated in all clinical situations. eGFR's persistently <60 mL/min signify possible Chronic Kidney Disease.    Anion gap 12 5 - 15  Magnesium     Status: Abnormal   Collection Time: 03/25/15  6:15 AM  Result Value Ref Range   Magnesium 1.6 (L) 1.7 - 2.4 mg/dL  Phosphorus     Status: None   Collection Time: 03/25/15  6:15 AM  Result Value Ref Range   Phosphorus 3.4 2.5 - 4.6 mg/dL  Differential     Status: Abnormal   Collection Time: 03/25/15  6:15 AM  Result Value Ref Range   Neutrophils Relative % 96 (H) 43 - 77 %   Lymphocytes Relative 3 (L) 12 - 46 %   Monocytes Relative 0 (L) 3 - 12 %   Eosinophils Relative 1 0 - 5 %   Basophils Relative 0 0 - 1 %   Neutro Abs 23.9 (H) 1.7 - 7.7 K/uL   Lymphs Abs 0.7 0.7 - 4.0 K/uL   Monocytes Absolute 0.0 (L) 0.1 - 1.0 K/uL   Eosinophils Absolute 0.2 0.0 - 0.7 K/uL   Basophils Absolute 0.0 0.0 - 0.1 K/uL   WBC Morphology MILD LEFT SHIFT (1-5% METAS, OCC MYELO, OCC BANDS)     Comment: DOHLE BODIES VACUOLATED NEUTROPHILS     Radiology/Results: Dg Abd Portable 1v  03/24/2015   CLINICAL DATA:  Small-bowel obstruction  EXAM: PORTABLE ABDOMEN - 1 VIEW  COMPARISON:  03/23/2015  FINDINGS: NG tube is been placed with the tip in the gastric antrum  Small bowel dilatation shows mild interval improvement. Colon is decompressed with moderate stool in the right colon. Surgical clips in the gallbladder fossa.  IMPRESSION: Mild improvement in small bowel  obstruction. NG tube in the gastric antrum.   Electronically Signed   By: Franchot Gallo M.D.   On: 03/24/2015 08:25   Dg Abd Portable 1v  03/23/2015   CLINICAL DATA:  Followup small bowel obstruction.  EXAM: PORTABLE ABDOMEN - 1 VIEW  COMPARISON:  03/22/2015  FINDINGS: Dilated loops small bowel are similar to the prior exam allowing for differences in radiographic technique different degrees of magnification.  There is a small amount of air in a nondistended colon.  IMPRESSION: 1. Persistent high-grade partial small bowel obstruction. No significant change from the previous day's study.   Electronically Signed   By: Lajean Manes M.D.   On: 03/23/2015 10:01    Anti-infectives: Anti-infectives    Start     Dose/Rate Route Frequency Ordered Stop   03/25/15 0800  cefOXitin (MEFOXIN) 1 g in dextrose 5 % 50 mL IVPB     1 g 100 mL/hr over 30 Minutes Intravenous 3 times per day 03/25/15 0752     03/24/15 1700  cefOXitin (MEFOXIN) 1 g in dextrose 5 % 50 mL IVPB     1 g 100 mL/hr over 30 Minutes Intravenous Every 6 hours 03/24/15 1511 03/25/15 0512   03/24/15 0915  cefOXitin (MEFOXIN) 2 g in dextrose 5 % 50 mL IVPB    Comments:  Pharmacy may adjust dosing strength, interval, or rate of medication as needed for optimal therapy for the patient Send with patient on call to the OR.  Anesthesia to complete antibiotic administration [facility]   2 g  100 mL/hr over 30 Minutes Intravenous On call to O.R. 03/24/15 0910 03/24/15 1042      Assessment/Plan: Problem List: Patient Active Problem List   Diagnosis Date Noted  . Malnutrition of moderate degree 03/20/2015  . Small bowel obstruction 03/18/2015  . Elevated blood pressure 03/18/2015  . Noncompliance w/medication treatment due to intermit use of medication 12/07/2014  . Fall against object 06/24/2014  . Contusion of left hand 06/24/2014  . Contusion of left hip 06/24/2014  . Loss of weight 04/12/2014  .  Malignant hypertension with renal failure and congestive heart failure 09/23/2013  . Knee pain, bilateral 07/27/2013  . Chronic fatigue disorder 01/19/2013  . Vaginitis due to Candida 09/02/2012  . Sinusitis 09/02/2012  . Anemia 09/02/2012  . Polymyalgia rheumatica 09/10/2011  . Vertigo 08/21/2011  . Fatigue 05/11/2011  . ANEMIA OF CHRONIC DISEASE 09/06/2010  . KNEE PAIN, CHRONIC 05/26/2010  . DIZZINESS 05/11/2010  . SHOULDER PAIN 04/25/2010  . FREQUENCY, URINARY 03/16/2010  . CONSTIPATION, CHRONIC 01/17/2010  . FOOT PAIN 01/17/2009  . RASH AND OTHER NONSPECIFIC SKIN ERUPTION 01/17/2009  . TINNITUS NOS 10/18/2008  . B12 deficiency 08/10/2008  . LOW BACK PAIN 06/23/2008  . CHEST WALL PAIN 06/23/2008  . DYSURIA 03/18/2008  . Abdominal pain 03/18/2008  . HYPERLIPIDEMIA 12/24/2007  . DYSPNEA 12/24/2007  . Anxiety state 12/07/2007  . GERD 12/07/2007  . CHEST PAIN 12/07/2007  . OTHER SPEC FORMS CHRONIC ISCHEMIC HEART DISEASE 12/01/2007  . Diabetes type 2, controlled 11/28/2007  . Pain in joint 11/28/2007  . GOUT 09/17/2007  . Adjustment disorder with mixed anxiety and depressed mood 09/17/2007  . Coronary atherosclerosis 09/17/2007  . Disorder resulting from impaired renal function 09/17/2007  . OSTEOPOROSIS 09/17/2007  . DVT, HX OF 09/17/2007  . PANCREATITIS, HX OF 09/17/2007    Sepsis from peritoneal contamination (controlled) with Mefoxin ordered to continue q 12.  Renal insult.  Will move to stepdown.   1 Day Post-Op    LOS: 7 days   Matt B. Hassell Done, MD, [facility] [phone] beeper [phone]  03/25/2015 7:56 AM

## 2015-03-25 NOTE — Op Note (Signed)
[name], [name]              ACCOUNT NO.:  [id number]  MEDICAL RECORD NO.:  [id number]  LOCATION:  1509                         FACILITY:  [facility]  PHYSICIAN:  Thornton Park. Daphine Deutscher, MD  DATE OF BIRTH:  03/28/1933  DATE OF PROCEDURE:  03/24/2015 DATE OF DISCHARGE:                              OPERATIVE REPORT   PREOPERATIVE DIAGNOSIS:  Small-bowel obstruction.  POSTOPERATIVE DIAGNOSIS:  Dense adhesion in the retroperitoneum with small-bowel obstruction.  PROCEDURE:  Laparoscopic enterolysis anteriorly, opened with decompressive enterotomy and lysis of band producing obstruction.  SURGEON:  Thornton Park. Daphine Deutscher, MD  ASSISTANT:  None.  ANESTHESIA:  General endotracheal.  DESCRIPTION OF PROCEDURE:  The patient was taken to room 6 on Mar 24, 2015, given general anesthesia.  Preoperatively, she received Mefoxin. The abdomen was prepped with PCMX and draped sterilely.  The access to the abdomen was achieved through the left upper quadrant with a 5 mm Optiview without difficulty.  She had numerous adhesions from a previous midline incision and also from her open cholecystectomy.  These were lysed with sharp dissection and with a Harmonic Scalpel.  In addition, she had some dense omental adhesions down into her pelvis, but there was 1 area that I could not.  I completely lysed as it was very far posterior.  I elected to open at that point, and made a lower midline incision where 1 of my trocars was.  I had placed 3 trocars initially. The midline incision was made, and I had actually extend that because I was unable to get the bowel loops up.  She was quite dilated, thin, and compromised.  There was a little bit of swollen's that I noticed and went ahead and was able to find an area where it appeared that she had developed a neurotomy which I then slightly enlarged to insert the small end of the pull sucker and through that I was able to completely decompress her bowel proximally  distally.  This enabled me to follow the decompressed loop down to a very dense band on the left side posteriorly along the pelvis that I then lysed to completely free  things.  The enterotomy was closed in 2 layers with 4-0 PDS and with 3-0 Vicryl.  I changed gloves and irrigated with 6 L of saline.  I then closed the peritoneum with a running 2-0 PDS and with interrupted #1 Novafil.  The wound was irrigated and closed with some Vicryl subcutaneously and with staples. One of the ports was used for a JP drain which I placed down to the pelvis brought out laterally on the left and the other was closed.  The patient tolerated the procedure well, and was taken to the recovery room in satisfactory condition.     Thornton Park Daphine Deutscher, MD     MBM/MEDQ  D:  03/24/2015  T:  03/25/2015  Job:  [id number]

## 2015-03-25 NOTE — Progress Notes (Addendum)
Patient ID: [name], female   DOB: 06-May-1933, 81 y.o.   MRN: [id number] [facility] PROGRESS NOTE  [name] XLK:[id number] DOB: 05/10/1933 DOA: 03/18/2015 PCP: Walker Kehr, MD  Brief narrative:    81 year old female with history of hypertension, coronary artery disease, hyperlipidemia, diabetes who presented to [facility] ED with diffuse lower abdominal pain associated with nausea, vomiting and poor po intake.   Pt was found to have SBO on this admission. She was initially treated conservatively with supportive care, IVF, antiemetics PRN, NG tube. She pulled it out but surgery put the tube back in 03/23/2015.Since there was no significant improvement patient underwent laparoscopic surgery 03/24/2015.   Hospital course is complicated with development of peritoneal contamination and sepsis. She was placed on Mefoxin and transfer to SDU 5/13. PCCM consulted for sepsis management,   Assessment/Plan:    Principal problem: Severe sepsis from peritoneal contamination Leukocytosis  - Patient is status post laparoscopic surgery 03/24/2015 for small bowel obstruction. - Sepsis criteria met with tachycardia, tachypnea, leukocytosis and evidence of end organ damage lactic acidosis, acute renal failure. As mentioned, source of infection from peritoneal contamination status post surgery. - Sepsis order set placed. Follow up blood culture results, procalcitonin, lactic acid. - Continue Mefoxin. - Transfer to SDU - PCCM consulted, appreciate their input.  Active Problems: Acute renal failure - Likely secondary to sepsis. IV fluids started. - Monitor renal function.  Abdominal pain, nausea, vomiting / Small bowel obstruction - Secondary to adhesions. - Patient failed conservative treatment with nothing by mouth, IV fluids, NG tube placement. - Patient is status post laparoscopic surgery. She still has NG tube in place. -Continue nothing by mouth. Appreciate surgery following.   Right  foot pain - X ray with no acute findings other than degenerative changes   Acute Delirium / Acute encephalopathy - Likely from acute illness, underlying dementia   Coronary artery disease - Stable.  Type 2 diabetes mellitus without complications - Continue to hold metformin since patient is nothing by mouth.  - Continue sliding scale insulin since patient is nothing by mouth.  Essential hypertension - SBP 110's now. Not on antihypertensive meds at this time.   Moderate protein calorie malnutrition - Poor nutrition secondary to small bowel obstruction. - Continue TPN for nutritional support.   DVT Prophylaxis  - Heparin subQ ordered while pt in hospital   IV access:  Peripheral IV  Procedures and diagnostic studies:    Dg Abd Portable 1v 03/23/2015 1. Persistent high-grade partial small bowel obstruction. No significant change from the previous day's study. Electronically Signed By: Lajean Manes M.D. On: 03/23/2015 10:01   Dg Abd Portable 1v 03/22/2015 NG tube removed with recurrent small bowel obstruction. Electronically Signed By: Genevie Ann M.D. On: 03/22/2015 07:42   Dg Abd Portable 1v 03/21/2015 1. NG tube noted with tip in the upper portion of the stomach. Advancement suggested. 2. Interim improvement of small bowel distention. Colonic gas pattern is normal with stool throughout the colon. These results will be called to the ordering clinician or representative by the Radiologist Assistant, and communication documented in the PACS or zVision Dashboard. Electronically Signed By: Marcello Moores Register On: 03/21/2015 07:32   Dg Abd Portable 1v 03/20/2015 Small bowel dilation persists consistent with a persistent partial small bowel obstruction. No change from the previous day's study.   Dg Foot 2 Views Right 03/22/2015 Degenerative change without acute abnormality.   Dg Chest Port 1 View 03/25/2015  Bibasilar atelectasis. No pneumothorax. Degree of  inspiration shallow.   Electronically Signed   By: Lowella Grip III M.D.   On: 03/25/2015 09:03   Dg Abd Portable 1v 03/24/2015  Mild improvement in small bowel obstruction. NG tube in the gastric antrum.   Electronically Signed   By: Franchot Gallo M.D.   On: 03/24/2015 08:25   Laparoscopic enterolysis anteriorly, opened with decompressive enterotomy and lysis of band producing obstruction. 03/24/2015.  Medical Consultants:  Surgery Cardiology PCCM  Other Consultants:  Physical therapy  IAnti-Infectives:   Mefoxin 03/25/2015 -->    Leisa Lenz, MD  [facility] Pager [phone]  Time spent in minutes: 25 minutes  If 7PM-7AM, please contact night-coverage [email or web address] Password [facility] 03/25/2015, 9:55 AM   LOS: 7 days    HPI/Subjective: No acute overnight events. Patient says she does not feel good. No reports of nausea.   Objective: Filed Vitals:   03/25/15 0500 03/25/15 0900 03/25/15 0920 03/25/15 0940  BP: 102/66  138/70 110/58  Pulse: 117  117 117  Temp: 97.7 F (36.5 C) 98.7 F (37.1 C)    TempSrc: Oral Oral    Resp: 18  24 21   Height:  5' 4"  (1.626 m)    Weight:  68.7 kg (151 lb 7.3 oz)    SpO2: 100%  100% 100%    Intake/Output Summary (Last 24 hours) at 03/25/15 0955 Last data filed at 03/25/15 0900  Gross per 24 hour  Intake 3640.57 ml  Output   2040 ml  Net 1600.57 ml    Exam:   General:  Pt is alert,  not in acute distress  Cardiovascular: Regular rate and rhythm, S1/S2 (+)  Respiratory: Clear to auscultation bilaterally, no wheezing  Abdomen: dressing over surgery site, non tender, (+) BS; has NG tube   Extremities: No edema, pulses DP and PT palpable bilaterally  Neuro: Grossly nonfocal  Data Reviewed: Basic Metabolic Panel:  Recent Labs Lab 03/19/15 0552 03/20/15 0530 03/23/15 1045 03/24/15 0533 03/25/15 0615  NA 141 144 137 136 130*  K 4.3 4.4 3.9 3.4* 5.0  CL 109 109 102 99* 99*  CO2 21* 24 22 27  19*  GLUCOSE  137* 94 137* 159* 154*  BUN 32* 36* 21* 22* 29*  CREATININE 1.33* 1.09* 0.87 0.85 2.20*  CALCIUM 8.5* 8.6* 8.7* 8.4* 7.7*  MG  --   --   --  1.4* 1.6*  PHOS  --   --   --  3.6 3.4   Liver Function Tests:  Recent Labs Lab 03/24/15 0533  AST 17  ALT 10*  ALKPHOS 63  BILITOT 0.5  PROT 6.6  ALBUMIN 2.6*   No results for input(s): LIPASE, AMYLASE in the last 168 hours. No results for input(s): AMMONIA in the last 168 hours. CBC:  Recent Labs Lab 03/19/15 0552 03/20/15 0530 03/23/15 1000 03/24/15 0533 03/25/15 0615  WBC 6.8 5.2 7.5 6.1 24.8*  NEUTROABS  --   --  5.7 4.6 23.9*  HGB 11.3* 10.1* 11.2* 9.1* 10.2*  HCT 35.2* 31.9* 35.9* 29.2* 31.7*  MCV 92.6 93.5 94.2 92.7 91.9  PLT 361 344 309 247 182   Cardiac Enzymes: No results for input(s): CKTOTAL, CKMB, CKMBINDEX, TROPONINI in the last 168 hours. BNP: Invalid input(s): POCBNP CBG:  Recent Labs Lab 03/24/15 1650 03/24/15 2006 03/24/15 2354 03/25/15 0356 03/25/15 0759  GLUCAP 153* 178* 150* 137* 111*    Recent Results (from the past 240 hour(s))  MRSA PCR Screening     Status: None   Collection  Time: 03/24/15  8:52 AM  Result Value Ref Range Status   MRSA by PCR NEGATIVE NEGATIVE Final    Comment:        The GeneXpert MRSA Assay (FDA approved for NASAL specimens only), is one component of a comprehensive MRSA colonization surveillance program. It is not intended to diagnose MRSA infection nor to guide or monitor treatment for MRSA infections.      Scheduled Meds: . sodium chloride   Intravenous Once  . acetaminophen  1,000 mg Intravenous 4 times per day  . cefOXitin  1 g Intravenous Q12H  . heparin  5,000 Units Subcutaneous 3 times per day  . insulin aspart  0-15 Units Subcutaneous 6 times per day   Continuous Infusions: . dextrose 5 % and 0.45% NaCl 125 mL/hr at 03/25/15 0821  . Marland KitchenTPN (CLINIMIX-E) Adult 35 mL/hr at 03/24/15 1808   And  . fat emulsion 240 mL (03/24/15 1801)

## 2015-03-25 NOTE — Progress Notes (Signed)
CRITICAL VALUE ALERT  Critical value received:  Lactic acid 2.2  Date of notification:  03/25/15  Time of notification:  10:38  Critical value read back:Yes.    Nurse who received alert:  Gerlean Ren, RN  MD notified (1st page):  Dr. Craige Cotta  Time of first page:  10:40  MD notified (2nd page):  Time of second page:  Responding MD:  Dr. Craige Cotta  Time MD responded:  10:40

## 2015-03-25 NOTE — Progress Notes (Signed)
CRITICAL VALUE ALERT  Critical value received: lactic acid - 2.3  Date of notification:  03/25/15  Time of notification:  15:00  Critical value read back:Yes.    Nurse who received alert:  Gerlean Ren, RN  MD notified (1st page):  Pola Corn MD Time of first page:  15:00  MD notified (2nd page):  Time of second page:  Responding MD:  Pola Corn MD  Time MD responded:  15:00

## 2015-03-25 NOTE — Progress Notes (Signed)
Pt moved to room 1235 per MD order to transfer pt to stepdown. Writer gave report to receiving nurse, Psychologist, educational.

## 2015-03-25 NOTE — Progress Notes (Signed)
VASCULAR LAB PRELIMINARY  PRELIMINARY  PRELIMINARY  PRELIMINARY  Bilateral lower extremity venous duplex completed.    Preliminary report:  Bilateral:  No evidence of DVT, superficial thrombosis, or Baker's Cyst.   Maela Takeda, RVS 03/25/2015, 3:43 PM

## 2015-03-25 NOTE — Progress Notes (Signed)
Nutrition Follow-up  DOCUMENTATION CODES:  Non-severe (moderate) malnutrition in context of chronic illness  INTERVENTION:  - Continue TPN per pharmacy - Advance to CLD as soon as medically feasible - RD to continue to follow for needs and assessment  NUTRITION DIAGNOSIS:  Malnutrition related to chronic illness as evidenced by moderate depletion of body fat, severe depletion of muscle mass. -ongoing  GOAL:  Patient will meet greater than or equal to 90% of their needs -currently unmet  MONITOR:  Diet advancement, Labs, Weight trends, Skin, I & O's  ASSESSMENT: 81 year old female with history of hypertension, coronary artery disease, hyperlipidemia, iron deficiency anemia, depression, osteoporosis, mild chronic kidney disease presented to the ED with ongoing nausea for 4 days and diffuse crampy lower abdominal pain since one day prior to admission. Associated poor by mouth intake and generalized weakness. Patient was found to have small bowel obstruction.  NGT previously pulled but replaced 5/11. Pt had laparoscopic enterolysis (lysis of adhesions) yesterday for SBO and was transferred from 5 East to ICU/SDU about an hour ago. Pt with PICC in place currently receiving Clinimix E 5/15 @ 35 mL/hr with 20% lipids @ 10 mL/hr. Per pharmacy note yesterday, plan to keep current rate before advancing to goal rate of 70 mL/hr until electrolytes stabilize. Current rate is providing 42 grams protein, 1076 kcal which is not meeting needs. Goal rate will provide 84 grams protein, 1673 kcal.   Visualized `50cc dark green drainage and RN reports 500cc drained this shift prior to transfer.  Labs reviewed; CBGs: 111-178 mg/dL, Cl: 99 mmol/L, Na: 009 mmol/L, Ca low, BUN/creatinine elevated, Mg: 1.6 mmol/L, GFR; 23. Per pharmacy note yesterday, plan for KCl x 4, Mag Sulfate 2gm IV x 1 starting at 1800 yesterday.  Height:  Ht Readings from Last 1 Encounters:  03/25/15 5\' 4"  (1.626 m)     Weight:  Wt Readings from Last 1 Encounters:  03/25/15 151 lb 7.3 oz (68.7 kg)    Ideal Body Weight:  54.5 kg  Wt Readings from Last 10 Encounters:  03/25/15 151 lb 7.3 oz (68.7 kg)  12/31/14 145 lb (65.772 kg)  12/07/14 147 lb 8 oz (66.906 kg)  09/23/14 150 lb (68.04 kg)  08/19/14 150 lb (68.04 kg)  07/22/14 153 lb 12 oz (69.741 kg)  06/24/14 156 lb (70.761 kg)  05/26/14 162 lb (73.483 kg)  04/12/14 164 lb (74.39 kg)  03/14/14 172 lb (78.019 kg)    BMI:  Body mass index is 25.98 kg/(m^2).  Estimated Nutritional Needs:  Kcal:  1500-1700  Protein:  75-85g  Fluid:  1.5L/day  Skin:  Reviewed, no issues  Diet Order:  Diet NPO time specified Except for: Ice Chips TPN (CLINIMIX-E) Adult  EDUCATION NEEDS:  No education needs identified at this time   Intake/Output Summary (Last 24 hours) at 03/25/15 1020 Last data filed at 03/25/15 0900  Gross per 24 hour  Intake 4401.82 ml  Output   2130 ml  Net 2271.82 ml     Last BM:  5/11   7/11, RD, LDN Inpatient Clinical Dietitian Pager # [phone] After hours/weekend pager # [phone]

## 2015-03-26 ENCOUNTER — Inpatient Hospital Stay (HOSPITAL_COMMUNITY): Payer: Commercial Managed Care - HMO

## 2015-03-26 DIAGNOSIS — R410 Disorientation, unspecified: Secondary | ICD-10-CM | POA: Diagnosis present

## 2015-03-26 DIAGNOSIS — D72829 Elevated white blood cell count, unspecified: Secondary | ICD-10-CM

## 2015-03-26 LAB — CBC
HCT: 26.4 % — ABNORMAL LOW (ref 36.0–46.0)
HEMOGLOBIN: 8.3 g/dL — AB (ref 12.0–15.0)
MCH: 28.6 pg (ref 26.0–34.0)
MCHC: 31.4 g/dL (ref 30.0–36.0)
MCV: 91 fL (ref 78.0–100.0)
Platelets: 132 10*3/uL — ABNORMAL LOW (ref 150–400)
RBC: 2.9 MIL/uL — AB (ref 3.87–5.11)
RDW: 12.7 % (ref 11.5–15.5)
WBC: 24.8 10*3/uL — AB (ref 4.0–10.5)

## 2015-03-26 LAB — BASIC METABOLIC PANEL
Anion gap: 9 (ref 5–15)
BUN: 41 mg/dL — ABNORMAL HIGH (ref 6–20)
CO2: 22 mmol/L (ref 22–32)
CREATININE: 2.01 mg/dL — AB (ref 0.44–1.00)
Calcium: 7.7 mg/dL — ABNORMAL LOW (ref 8.9–10.3)
Chloride: 99 mmol/L — ABNORMAL LOW (ref 101–111)
GFR, EST AFRICAN AMERICAN: 26 mL/min — AB (ref >60)
GFR, EST NON AFRICAN AMERICAN: 22 mL/min — AB (ref >60)
GLUCOSE: 143 mg/dL — AB (ref 65–99)
POTASSIUM: 4.3 mmol/L (ref 3.5–5.1)
Sodium: 130 mmol/L — ABNORMAL LOW (ref 135–145)

## 2015-03-26 LAB — PHOSPHORUS: PHOSPHORUS: 2.8 mg/dL (ref 2.5–4.6)

## 2015-03-26 LAB — URINE CULTURE
COLONY COUNT: NO GROWTH
Culture: NO GROWTH

## 2015-03-26 LAB — GLUCOSE, CAPILLARY
GLUCOSE-CAPILLARY: 146 mg/dL — AB (ref 65–99)
GLUCOSE-CAPILLARY: 149 mg/dL — AB (ref 65–99)
GLUCOSE-CAPILLARY: 124 mg/dL — AB (ref 65–99)
GLUCOSE-CAPILLARY: 112 mg/dL — AB (ref 65–99)
GLUCOSE-CAPILLARY: 128 mg/dL — AB (ref 65–99)
GLUCOSE-CAPILLARY: 117 mg/dL — AB (ref 65–99)

## 2015-03-26 LAB — MAGNESIUM: Magnesium: 2.1 mg/dL (ref 1.7–2.4)

## 2015-03-26 LAB — TROPONIN I: TROPONIN I: 0.03 ng/mL (ref <0.031)

## 2015-03-26 LAB — PROCALCITONIN: Procalcitonin: 80.81 ng/mL

## 2015-03-26 MED ORDER — PHENOL 1.4 % MT LIQD: 2.0000 | OROMUCOSAL | Status: DC | PRN

## 2015-03-26 MED ORDER — LORAZEPAM 2 MG/ML IJ SOLN
0.5000 mg | Freq: Three times a day (TID) | INTRAMUSCULAR | Status: DC | PRN
  Administered 2015-03-26: 0.5 mg via INTRAVENOUS
  Filled 2015-03-26: qty 1

## 2015-03-26 MED ORDER — CEFTRIAXONE SODIUM IN DEXTROSE 40 MG/ML IV SOLN
2.0000 g | INTRAVENOUS | Status: AC
  Administered 2015-03-26 – 2015-03-30 (×5): 2 g via INTRAVENOUS
  Filled 2015-03-26 (×6): qty 50

## 2015-03-26 MED ORDER — ALUM & MAG HYDROXIDE-SIMETH 200-200-20 MG/5ML PO SUSP
30.0000 mL | Freq: Four times a day (QID) | ORAL | Status: DC | PRN
  Administered 2015-04-01 – 2015-04-16 (×3): 30 mL via ORAL
  Filled 2015-03-26 (×3): qty 30

## 2015-03-26 MED ORDER — METOPROLOL TARTRATE 1 MG/ML IV SOLN
2.5000 mg | Freq: Four times a day (QID) | INTRAVENOUS | Status: DC
Start: 2015-03-26 — End: 2015-04-20
  Administered 2015-03-26 – 2015-04-20 (×97): 2.5 mg via INTRAVENOUS
  Filled 2015-03-26 (×98): qty 5

## 2015-03-26 MED ORDER — ACETAMINOPHEN 650 MG RE SUPP
650.0000 mg | Freq: Four times a day (QID) | RECTAL | Status: DC | PRN
Start: 2015-03-26 — End: 2015-04-12
  Administered 2015-03-31 – 2015-04-09 (×3): 650 mg via RECTAL
  Filled 2015-03-26 (×3): qty 1

## 2015-03-26 MED ORDER — POLYVINYL ALCOHOL 1.4 % OP SOLN
1.0000 [drp] | Freq: Every day | OPHTHALMIC | Status: DC | PRN
  Filled 2015-03-26: qty 15

## 2015-03-26 MED ORDER — MAGIC MOUTHWASH
15.0000 mL | Freq: Four times a day (QID) | ORAL | Status: DC | PRN
  Filled 2015-03-26: qty 15

## 2015-03-26 MED ORDER — TRACE MINERALS CR-CU-F-FE-I-MN-MO-SE-ZN IV SOLN
INTRAVENOUS | Status: AC
  Administered 2015-03-26: 17:00:00 via INTRAVENOUS
  Filled 2015-03-26: qty 1680

## 2015-03-26 MED ORDER — PROMETHAZINE HCL 25 MG/ML IJ SOLN
6.2500 mg | INTRAMUSCULAR | Status: DC | PRN
  Administered 2015-03-31 – 2015-04-12 (×5): 12.5 mg via INTRAVENOUS
  Filled 2015-03-26 (×6): qty 1

## 2015-03-26 MED ORDER — METRONIDAZOLE IN NACL 5-0.79 MG/ML-% IV SOLN
500.0000 mg | Freq: Four times a day (QID) | INTRAVENOUS | Status: AC
  Administered 2015-03-26 – 2015-03-30 (×18): 500 mg via INTRAVENOUS
  Filled 2015-03-26 (×18): qty 100

## 2015-03-26 MED ORDER — DIPHENHYDRAMINE HCL 50 MG/ML IJ SOLN
12.5000 mg | Freq: Four times a day (QID) | INTRAMUSCULAR | Status: DC | PRN
  Administered 2015-03-30 (×2): 12.5 mg via INTRAVENOUS
  Filled 2015-03-26 (×2): qty 1

## 2015-03-26 MED ORDER — LIP MEDEX EX OINT
1.0000 "application " | TOPICAL_OINTMENT | Freq: Two times a day (BID) | CUTANEOUS | Status: DC
  Administered 2015-03-26 – 2015-04-20 (×48): 1 via TOPICAL
  Filled 2015-03-26: qty 7

## 2015-03-26 MED ORDER — HALOPERIDOL LACTATE 5 MG/ML IJ SOLN: 2.0000 mg | Freq: Four times a day (QID) | INTRAMUSCULAR | Status: DC | PRN

## 2015-03-26 MED ORDER — MENTHOL 3 MG MT LOZG: 1.0000 | LOZENGE | OROMUCOSAL | Status: DC | PRN

## 2015-03-26 MED ORDER — FAT EMULSION 20 % IV EMUL
240.0000 mL | INTRAVENOUS | Status: AC
  Administered 2015-03-26: 240 mL via INTRAVENOUS
  Filled 2015-03-26: qty 250

## 2015-03-26 MED ORDER — HALOPERIDOL LACTATE 5 MG/ML IJ SOLN
2.0000 mg | Freq: Four times a day (QID) | INTRAMUSCULAR | Status: DC | PRN
Start: 2015-03-26 — End: 2015-03-26
  Administered 2015-03-26: 2 mg via INTRAVENOUS
  Filled 2015-03-26: qty 1

## 2015-03-26 MED ORDER — LACTATED RINGERS IV BOLUS (SEPSIS): 1000.0000 mL | Freq: Three times a day (TID) | INTRAVENOUS | Status: AC | PRN

## 2015-03-26 MED ORDER — METOPROLOL TARTRATE 1 MG/ML IV SOLN
5.0000 mg | Freq: Four times a day (QID) | INTRAVENOUS | Status: DC | PRN
  Administered 2015-03-27: 5 mg via INTRAVENOUS
  Filled 2015-03-26 (×2): qty 5

## 2015-03-26 NOTE — Progress Notes (Addendum)
Patient ID: [name], female   DOB: 07/18/1933, 81 y.o.   MRN: [id number] [facility] PROGRESS NOTE  KAILEA DANNEMILLER UXN:[id number] DOB: Jan 07, 1933 DOA: 03/18/2015 PCP: Walker Kehr, MD  Brief narrative:    81 year old female with history of hypertension, coronary artery disease, hyperlipidemia, diabetes who presented to [facility] ED with diffuse lower abdominal pain associated with nausea, vomiting and poor po intake.   Pt was found to have SBO on this admission. She was initially treated conservatively with supportive care, IVF, antiemetics PRN, NG tube. She pulled it out but surgery put the tube back in 03/23/2015.Since there was no significant improvement patient underwent laparoscopic surgery with lysis of adhesions and laparotomy with bowel decompression on 03/24/2015.  Post-operatively she has developed sepsis due to peritoneal contamination. She was transferred to [facility] 03/25/2015.   Assessment/Plan:    Principal problem: Severe sepsis secondary to peritonitis / Leukocytosis  - Sepsis criteria met 03/25/2015 with tachycardia, tachypnea, hypotension, lactic acidosis. Procalcitonin was more than 175. - Source of infection thought to be peritoneal contamination / peritonitis status post laparoscopic surgery 03/24/2015. - Mefoxin given 03/25/2015 but antibiotics now changed to rocephin and flagyl.  - Appreciate surgery following and their recommendations. - PCCM also consulted for sepsis management.  - Blood cultures so far show no growth. - WBC count slightly better this am - Procalcitonin trending down, > 175 --> 147 --> 80  Active Problems: Abdominal pain / Small bowel obstruction secondary to adhesions - Status post laparoscopic surgery with lysis of adhesions and laparotomy with bowel decompression on 03/24/2015. - Continue NPO and TPN for prolonged ileus and due to pt being NPO - Continue NG tube per surgery recommendations  Acute blood loss anemia / Post-operative blood loss  anemia - Hemoglobin down from 10.2 to 8.3 this am - No evidence of gross bleed - Continue to monitor CBC daily   Thrombocytopenia - Mild, platelet count 132 - D/C heparin subQ adn use SCD's for DVT prophylaxis  Acute renal failure - Likely secondary to sepsis. - Creatinine improved with IV fluids   Hyponatremia - Likely from sepsis - Sodium 130 this am - Continue to monitor BMP daily   Right foot pain - X ray with no acute findings other than degenerative changes   Acute Delirium / Acute encephalopathy - Likely from acute illness, underlying dementia   Coronary artery disease - Stable.  Type 2 diabetes mellitus without complications -Continue to hold metformin since patient is NPO - CBG's in past 24 hours: 146, 149, 124 - Continue SSI  Essential hypertension - Continue metoprolol 2.5 mg IV Q 6 hours   Moderate protein calorie malnutrition -Poor nutrition secondary to small bowel obstruction and now peritonitis - Continue TPN - Keep NPO    DVT Prophylaxis  - Heparin subQ D/C'ed due to thrombocytopenia, platelet count dropped from 300's on admission to 132 today  - Use SCD's bilaterally    IV access:  Peripheral IV  Procedures and diagnostic studies:    Dg Abd Portable 1v 03/23/2015 1. Persistent high-grade partial small bowel obstruction. No significant change from the previous day's study. Electronically Signed By: Lajean Manes M.D. On: 03/23/2015 10:01   Dg Abd Portable 1v 03/22/2015 NG tube removed with recurrent small bowel obstruction. Electronically Signed By: Genevie Ann M.D. On: 03/22/2015 07:42   Dg Abd Portable 1v 03/21/2015 1. NG tube noted with tip in the upper portion of the stomach. Advancement suggested. 2. Interim improvement of small bowel distention. Colonic gas pattern  is normal with stool throughout the colon. These results will be called to the ordering clinician or representative by the Radiologist Assistant, and communication  documented in the PACS or zVision Dashboard. Electronically Signed By: Marcello Moores Register On: 03/21/2015 07:32   Dg Abd Portable 1v 03/20/2015 Small bowel dilation persists consistent with a persistent partial small bowel obstruction. No change from the previous day's study.   Dg Foot 2 Views Right 03/22/2015 Degenerative change without acute abnormality.   Dg Chest Port 1 View 03/25/2015  Bibasilar atelectasis. No pneumothorax. Degree of inspiration shallow.   Electronically Signed   By: Lowella Grip III M.D.   On: 03/25/2015 09:03   Dg Abd Portable 1v 03/24/2015  Mild improvement in small bowel obstruction. NG tube in the gastric antrum.   Electronically Signed   By: Franchot Gallo M.D.   On: 03/24/2015 08:25   Laparoscopic enterolysis anteriorly, opened with decompressive enterotomy and lysis of band producing obstruction. 03/24/2015.  Medical Consultants:  Surgery Cardiology PCCM  Other Consultants:  Physical therapy  IAnti-Infectives:   Mefoxin 03/25/2015 --> 03/26/2015 Rocephin and flagyl 03/26/2015 -->    Leisa Lenz, MD  [facility] Pager [phone]  Time spent in minutes: 25 minutes  If 7PM-7AM, please contact night-coverage [email or web address] Password [facility] 03/26/2015, 11:36 AM   LOS: 8 days    HPI/Subjective: No acute overnight events. Patient more confused today, agitated.   Objective: Filed Vitals:   03/26/15 0800 03/26/15 0900 03/26/15 1000 03/26/15 1100  BP: 147/60 121/71 145/68 145/68  Pulse: 110 116 124 122  Temp:      TempSrc:      Resp: 22 16 24 25   Height:      Weight:      SpO2: 99% 97% 99% 99%    Intake/Output Summary (Last 24 hours) at 03/26/15 1136 Last data filed at 03/26/15 1100  Gross per 24 hour  Intake 4163.49 ml  Output   1705 ml  Net 2458.49 ml    Exam:   General:  Pt is awake, no distress  Cardiovascular: Regular rate and rhythm, appreciate S1, S2  Respiratory: no wheezing, no rhonchi   Abdomen: NG in place,  dressing over surgical site, very sluggish bowel sounds  Extremities: pulses palpable, trace LE edema   Neuro: Nonfocal  Data Reviewed: Basic Metabolic Panel:  Recent Labs Lab 03/20/15 0530 03/23/15 1045 03/24/15 0533 03/25/15 0615 03/26/15 0510  NA 144 137 136 130* 130*  K 4.4 3.9 3.4* 5.0 4.3  CL 109 102 99* 99* 99*  CO2 24 22 27  19* 22  GLUCOSE 94 137* 159* 154* 143*  BUN 36* 21* 22* 29* 41*  CREATININE 1.09* 0.87 0.85 2.20* 2.01*  CALCIUM 8.6* 8.7* 8.4* 7.7* 7.7*  MG  --   --  1.4* 1.6* 2.1  PHOS  --   --  3.6 3.4 2.8   Liver Function Tests:  Recent Labs Lab 03/24/15 0533  AST 17  ALT 10*  ALKPHOS 63  BILITOT 0.5  PROT 6.6  ALBUMIN 2.6*   No results for input(s): LIPASE, AMYLASE in the last 168 hours. No results for input(s): AMMONIA in the last 168 hours. CBC:  Recent Labs Lab 03/20/15 0530 03/23/15 1000 03/24/15 0533 03/25/15 0615 03/26/15 0510  WBC 5.2 7.5 6.1 24.8* 24.8*  NEUTROABS  --  5.7 4.6 23.9*  --   HGB 10.1* 11.2* 9.1* 10.2* 8.3*  HCT 31.9* 35.9* 29.2* 31.7* 26.4*  MCV 93.5 94.2 92.7 91.9 91.0  PLT 344 309  247 182 132*   Cardiac Enzymes:  Recent Labs Lab 03/25/15 1315 03/25/15 2223 03/26/15 0510  TROPONINI 0.03 0.03 0.03   BNP: Invalid input(s): POCBNP CBG:  Recent Labs Lab 03/25/15 1616 03/25/15 1953 03/26/15 0113 03/26/15 0330 03/26/15 0817  GLUCAP 114* 141* 146* 149* 124*    Recent Results (from the past 240 hour(s))  MRSA PCR Screening     Status: None   Collection Time: 03/24/15  8:52 AM  Result Value Ref Range Status   MRSA by PCR NEGATIVE NEGATIVE Final    Comment:        The GeneXpert MRSA Assay (FDA approved for NASAL specimens only), is one component of a comprehensive MRSA colonization surveillance program. It is not intended to diagnose MRSA infection nor to guide or monitor treatment for MRSA infections.   Culture, blood (x 2)     Status: None (Preliminary result)   Collection Time: 03/25/15   9:30 AM  Result Value Ref Range Status   Specimen Description BLOOD RIGHT HAND  Final   Special Requests BOTTLES DRAWN AEROBIC ONLY 5CC  Final   Culture   Final           BLOOD CULTURE RECEIVED NO GROWTH TO DATE CULTURE WILL BE HELD FOR 5 DAYS BEFORE ISSUING A FINAL NEGATIVE REPORT Performed at [facility]    Report Status PENDING  Incomplete  Culture, blood (x 2)     Status: None (Preliminary result)   Collection Time: 03/25/15  9:35 AM  Result Value Ref Range Status   Specimen Description BLOOD RIGHT ARM  Final   Special Requests BOTTLES DRAWN AEROBIC AND ANAEROBIC 6CC  Final   Culture   Final           BLOOD CULTURE RECEIVED NO GROWTH TO DATE CULTURE WILL BE HELD FOR 5 DAYS BEFORE ISSUING A FINAL NEGATIVE REPORT Performed at [facility]    Report Status PENDING  Incomplete     Scheduled Meds: . cefTRIAXone (ROCEPHIN)  IV  2 g Intravenous Q24H  . heparin  5,000 Units Subcutaneous 3 times per day  . insulin aspart  0-15 Units Subcutaneous 6 times per day  . metoprolol  2.5 mg Intravenous 4 times per day  . metronidazole  500 mg Intravenous Q6H   Continuous Infusions: . TPN (CLINIMIX) Adult without lytes 70 mL/hr at 03/25/15 1755   And  . fat emulsion 240 mL (03/25/15 1755)  . lactated ringers 65 mL/hr at 03/25/15 1741

## 2015-03-26 NOTE — Progress Notes (Signed)
[address] [place], [place] [address] Phone: [phone] FAX: [phone]   STEFANI BAIK [id number] [birth year]   Problem List:   Principal Problem:   Small bowel obstruction s/p exlap/LOA/decompression 03/25/2015 Active Problems:   Diabetes type 2, controlled   Adjustment disorder with mixed anxiety and depressed mood   Coronary atherosclerosis   Abdominal pain   Knee pain, bilateral   Elevated blood pressure   Malnutrition of moderate degree   2 Days Post-Op  Procedure(s): LAPAROSCOPY DIAGNOSTIC LAPAROSCOPIC LYSIS OF ADHESIONS LAPAROTOMY with decompression of bowel  Assessment  OK  Plan:  -NGT until flatus -TNA for malnutrition & prolonged ileus -control HTN & inc HR - low dose metorpolol & pain control -pain control -anxiolysis -delerium probably w emergent surgery & adv age w h/o BZ use/anxiety - not severe follow in SDU -foley until more mobile -VTE prophylaxis- SCDs, etc -mobilize as tolerated to help recovery  Adin Hector, M.D., F.A.C.S. Gastrointestinal and Minimally Invasive Surgery [facility] [address], [place], [place] [address] [phone] Main / Paging   03/26/2015  Subjective:  Confused - "I am NOT in a hospital.  So you're keeping me here?  Take me to my sister's" Denies much pain In SDU  Objective:  Vital signs:  Filed Vitals:   03/26/15 0600 03/26/15 0700 03/26/15 0800 03/26/15 0900  BP: 146/43 128/48 147/60 121/71  Pulse: 110 119 110 116  Temp:      TempSrc:      Resp: 26 22 22 16   Height:      Weight:      SpO2: 100% 100% 99% 97%    Last BM Date: 03/23/15  Intake/Output   Yesterday:  05/13 0701 - 05/14 0700 In: 4054.7 [I.V.:2306.8; IV Piggyback:200; TPN:1547.9] Out: 1815 [Urine:780; Emesis/NG output:950; Drains:85] This shift:  Total I/O In: 580 [I.V.:260; TPN:320] Out: 155 [Urine:155]  Bowel  function:  Flatus: n  BM: n  Drain: thin green NGT output  Physical Exam:  General: Pt awake/alert/oriented x1 in no acute distress Eyes: PERRL, normal EOM.  Sclera clear.  No icterus Neuro: CN II-XII intact w/o focal sensory/motor deficits. Lymph: No head/neck/groin lymphadenopathy Psych:  Some delerium.  Not agitated HENT: Normocephalic, Mucus membranes moist.  No thrush Neck: Supple, No tracheal deviation Chest: No chest wall pain w good excursion CV:  Pulses intact.  Regular rhythm MS: Normal AROM mjr joints.  No obvious deformity Abdomen: Soft.  Mod distended.  Mildly tender at incisions only.  No evidence of peritonitis.  No incarcerated hernias. Ext:  SCDs BLE.  No mjr edema.  No cyanosis Skin: No petechiae / purpura  Results:   Labs: Results for orders placed or performed during the hospital encounter of 03/18/15 (from the past 48 hour(s))  Type and screen     Status: None (Preliminary result)   Collection Time: 03/24/15  9:55 AM  Result Value Ref Range   ABO/RH(D) B POS    Antibody Screen POS    Sample Expiration 03/27/2015    Antibody Identification ANTI E    PT AG Type NEGATIVE FOR E ANTIGEN    DAT, IgG NEG    Unit Number [id number]    Blood Component Type RED CELLS,LR    Unit division 00    Status of Unit ALLOCATED    Transfusion Status OK TO TRANSFUSE    Crossmatch Result COMPATIBLE    Donor AG Type NEGATIVE FOR E  ANTIGEN    Unit Number [id number]    Blood Component Type RED CELLS,LR    Unit division 00    Status of Unit ALLOCATED    Transfusion Status OK TO TRANSFUSE    Crossmatch Result COMPATIBLE    Donor AG Type NEGATIVE FOR E ANTIGEN   Glucose, capillary     Status: Abnormal   Collection Time: 03/24/15  4:50 PM  Result Value Ref Range   Glucose-Capillary 153 (H) 65 - 99 mg/dL  Glucose, capillary     Status: Abnormal   Collection Time: 03/24/15  8:06 PM  Result Value Ref Range   Glucose-Capillary 178 (H) 65 - 99 mg/dL  Glucose, capillary      Status: Abnormal   Collection Time: 03/24/15 11:54 PM  Result Value Ref Range   Glucose-Capillary 150 (H) 65 - 99 mg/dL  Glucose, capillary     Status: Abnormal   Collection Time: 03/25/15  3:56 AM  Result Value Ref Range   Glucose-Capillary 137 (H) 65 - 99 mg/dL  CBC     Status: Abnormal   Collection Time: 03/25/15  6:15 AM  Result Value Ref Range   WBC 24.8 (H) 4.0 - 10.5 K/uL   RBC 3.45 (L) 3.87 - 5.11 MIL/uL   Hemoglobin 10.2 (L) 12.0 - 15.0 g/dL   HCT 31.7 (L) 36.0 - 46.0 %   MCV 91.9 78.0 - 100.0 fL   MCH 29.6 26.0 - 34.0 pg   MCHC 32.2 30.0 - 36.0 g/dL   RDW 12.8 11.5 - 15.5 %   Platelets 182 150 - 400 K/uL  Basic metabolic panel     Status: Abnormal   Collection Time: 03/25/15  6:15 AM  Result Value Ref Range   Sodium 130 (L) 135 - 145 mmol/L   Potassium 5.0 3.5 - 5.1 mmol/L    Comment: DELTA CHECK NOTED REPEATED TO VERIFY NO VISIBLE HEMOLYSIS    Chloride 99 (L) 101 - 111 mmol/L   CO2 19 (L) 22 - 32 mmol/L   Glucose, Bld 154 (H) 65 - 99 mg/dL   BUN 29 (H) 6 - 20 mg/dL   Creatinine, Ser 2.20 (H) 0.44 - 1.00 mg/dL    Comment: DELTA CHECK NOTED REPEATED TO VERIFY    Calcium 7.7 (L) 8.9 - 10.3 mg/dL   GFR calc non Af Amer 20 (L) >60 mL/min   GFR calc Af Amer 23 (L) >60 mL/min    Comment: (NOTE) The eGFR has been calculated using the CKD EPI equation. This calculation has not been validated in all clinical situations. eGFR's persistently <60 mL/min signify possible Chronic Kidney Disease.    Anion gap 12 5 - 15  Magnesium     Status: Abnormal   Collection Time: 03/25/15  6:15 AM  Result Value Ref Range   Magnesium 1.6 (L) 1.7 - 2.4 mg/dL  Phosphorus     Status: None   Collection Time: 03/25/15  6:15 AM  Result Value Ref Range   Phosphorus 3.4 2.5 - 4.6 mg/dL  Triglycerides     Status: None   Collection Time: 03/25/15  6:15 AM  Result Value Ref Range   Triglycerides 93 <150 mg/dL    Comment: Performed at [facility]  Differential     Status:  Abnormal   Collection Time: 03/25/15  6:15 AM  Result Value Ref Range   Neutrophils Relative % 96 (H) 43 - 77 %   Lymphocytes Relative 3 (L) 12 - 46 %   Monocytes  Relative 0 (L) 3 - 12 %   Eosinophils Relative 1 0 - 5 %   Basophils Relative 0 0 - 1 %   Neutro Abs 23.9 (H) 1.7 - 7.7 K/uL   Lymphs Abs 0.7 0.7 - 4.0 K/uL   Monocytes Absolute 0.0 (L) 0.1 - 1.0 K/uL   Eosinophils Absolute 0.2 0.0 - 0.7 K/uL   Basophils Absolute 0.0 0.0 - 0.1 K/uL   WBC Morphology MILD LEFT SHIFT (1-5% METAS, OCC MYELO, OCC BANDS)     Comment: DOHLE BODIES VACUOLATED NEUTROPHILS   Glucose, capillary     Status: Abnormal   Collection Time: 03/25/15  7:59 AM  Result Value Ref Range   Glucose-Capillary 111 (H) 65 - 99 mg/dL  Lactic acid, plasma     Status: Abnormal   Collection Time: 03/25/15  9:26 AM  Result Value Ref Range   Lactic Acid, Venous 2.2 (HH) 0.5 - 2.0 mmol/L    Comment: REPEATED TO VERIFY CRITICAL RESULT CALLED TO, READ BACK BY AND VERIFIED WITH: SHAFFER,S @ 1038 ON [id number] BY POTEAT,S   Procalcitonin     Status: None   Collection Time: 03/25/15  9:26 AM  Result Value Ref Range   Procalcitonin >175.00 ng/mL    Comment:        Interpretation: PCT >= 10 ng/mL: Important systemic inflammatory response, almost exclusively due to severe bacterial sepsis or septic shock. (NOTE)         ICU PCT Algorithm               Non ICU PCT Algorithm    ----------------------------     ------------------------------         PCT < 0.25 ng/mL                 PCT < 0.1 ng/mL     Stopping of antibiotics            Stopping of antibiotics       strongly encouraged.               strongly encouraged.    ----------------------------     ------------------------------       PCT level decrease by               PCT < 0.25 ng/mL       >= 80% from peak PCT       OR PCT 0.25 - 0.5 ng/mL          Stopping of antibiotics                                             encouraged.     Stopping of antibiotics            encouraged.    ----------------------------     ------------------------------       PCT level decrease by              PCT >= 0.25 ng/mL       < 80% from peak PCT        AND PCT >= 0.5 ng/mL             Continuing antibiotics  encouraged.       Continuing antibiotics            encouraged.    ----------------------------     ------------------------------     PCT level increase compared          PCT > 0.5 ng/mL         with peak PCT AND          PCT >= 0.5 ng/mL             Escalation of antibiotics                                          strongly encouraged.      Escalation of antibiotics        strongly encouraged. Performed at [facility]   Culture, blood (x 2)     Status: None (Preliminary result)   Collection Time: 03/25/15  9:30 AM  Result Value Ref Range   Specimen Description BLOOD RIGHT HAND    Special Requests BOTTLES DRAWN AEROBIC ONLY 5CC    Culture             BLOOD CULTURE RECEIVED NO GROWTH TO DATE CULTURE WILL BE HELD FOR 5 DAYS BEFORE ISSUING A FINAL NEGATIVE REPORT Performed at [facility]    Report Status PENDING   Culture, blood (x 2)     Status: None (Preliminary result)   Collection Time: 03/25/15  9:35 AM  Result Value Ref Range   Specimen Description BLOOD RIGHT ARM    Special Requests BOTTLES DRAWN AEROBIC AND ANAEROBIC 6CC    Culture             BLOOD CULTURE RECEIVED NO GROWTH TO DATE CULTURE WILL BE HELD FOR 5 DAYS BEFORE ISSUING A FINAL NEGATIVE REPORT Performed at [facility]    Report Status PENDING   Blood gas, arterial     Status: Abnormal   Collection Time: 03/25/15 10:45 AM  Result Value Ref Range   O2 Content 2.0 L/min   Delivery systems NASAL CANNULA    pH, Arterial 7.403 7.350 - 7.450   pCO2 arterial 32.8 (L) 35.0 - 45.0 mmHg   pO2, Arterial 103 (H) 80.0 - 100.0 mmHg   Bicarbonate 20.0 20.0 - 24.0 mEq/L   TCO2 18.6 0 - 100 mmol/L   Acid-base deficit 3.6 (H)  0.0 - 2.0 mmol/L   O2 Saturation 97.0 %   Patient temperature 98.6    Collection site RIGHT RADIAL    Drawn by [id number]    Sample type ARTERIAL DRAW    Allens test (pass/fail) PASS PASS  Glucose, capillary     Status: Abnormal   Collection Time: 03/25/15 11:20 AM  Result Value Ref Range   Glucose-Capillary 148 (H) 65 - 99 mg/dL  Sodium, urine, random     Status: None   Collection Time: 03/25/15 11:57 AM  Result Value Ref Range   Sodium, Ur 23 mEq/L    Comment: Performed at [facility]  Creatinine, urine, random     Status: None   Collection Time: 03/25/15 11:57 AM  Result Value Ref Range   Creatinine, Urine 112.6 mg/dL    Comment: No reference range established. Performed at [facility]   Urinalysis, Routine w reflex microscopic     Status: Abnormal   Collection Time: 03/25/15 11:57 AM  Result Value  Ref Range   Color, Urine YELLOW YELLOW   APPearance CLOUDY (A) CLEAR   Specific Gravity, Urine 1.023 1.005 - 1.030   pH 5.0 5.0 - 8.0   Glucose, UA NEGATIVE NEGATIVE mg/dL   Hgb urine dipstick NEGATIVE NEGATIVE   Bilirubin Urine NEGATIVE NEGATIVE   Ketones, ur NEGATIVE NEGATIVE mg/dL   Protein, ur 30 (A) NEGATIVE mg/dL   Urobilinogen, UA 0.2 0.0 - 1.0 mg/dL   Nitrite NEGATIVE NEGATIVE   Leukocytes, UA NEGATIVE NEGATIVE  Urine microscopic-add on     Status: Abnormal   Collection Time: 03/25/15 11:57 AM  Result Value Ref Range   Squamous Epithelial / LPF RARE RARE   WBC, UA 3-6 <3 WBC/hpf   RBC / HPF 7-10 <3 RBC/hpf   Bacteria, UA MANY (A) RARE   Urine-Other MUCOUS PRESENT   Lactic acid, plasma     Status: Abnormal   Collection Time: 03/25/15  1:15 PM  Result Value Ref Range   Lactic Acid, Venous 2.3 (HH) 0.5 - 2.0 mmol/L    Comment: REPEATED TO VERIFY CRITICAL RESULT CALLED TO, READ BACK BY AND VERIFIED WITH: SHAFFER,S @ 1458 ON [id number] BY POTEAT,S   Troponin I     Status: None   Collection Time: 03/25/15  1:15 PM  Result Value Ref Range   Troponin I  0.03 <0.031 ng/mL    Comment:        NO INDICATION OF MYOCARDIAL INJURY.   Procalcitonin - Baseline     Status: None   Collection Time: 03/25/15  1:15 PM  Result Value Ref Range   Procalcitonin 147.10 ng/mL    Comment:        Interpretation: PCT >= 10 ng/mL: Important systemic inflammatory response, almost exclusively due to severe bacterial sepsis or septic shock. (NOTE)         ICU PCT Algorithm               Non ICU PCT Algorithm    ----------------------------     ------------------------------         PCT < 0.25 ng/mL                 PCT < 0.1 ng/mL     Stopping of antibiotics            Stopping of antibiotics       strongly encouraged.               strongly encouraged.    ----------------------------     ------------------------------       PCT level decrease by               PCT < 0.25 ng/mL       >= 80% from peak PCT       OR PCT 0.25 - 0.5 ng/mL          Stopping of antibiotics                                             encouraged.     Stopping of antibiotics           encouraged.    ----------------------------     ------------------------------       PCT level decrease by              PCT >= 0.25 ng/mL       <  80% from peak PCT        AND PCT >= 0.5 ng/mL             Continuing antibiotics                                              encouraged.       Continuing antibiotics            encouraged.    ----------------------------     ------------------------------     PCT level increase compared          PCT > 0.5 ng/mL         with peak PCT AND          PCT >= 0.5 ng/mL             Escalation of antibiotics                                          strongly encouraged.      Escalation of antibiotics        strongly encouraged. Performed at [facility]   Glucose, capillary     Status: Abnormal   Collection Time: 03/25/15  4:16 PM  Result Value Ref Range   Glucose-Capillary 114 (H) 65 - 99 mg/dL   Comment 1 Notify RN    Comment 2 Document in Chart    Glucose, capillary     Status: Abnormal   Collection Time: 03/25/15  7:53 PM  Result Value Ref Range   Glucose-Capillary 141 (H) 65 - 99 mg/dL  Troponin I     Status: None   Collection Time: 03/25/15 10:23 PM  Result Value Ref Range   Troponin I 0.03 <0.031 ng/mL    Comment:        NO INDICATION OF MYOCARDIAL INJURY.   Glucose, capillary     Status: Abnormal   Collection Time: 03/26/15  1:13 AM  Result Value Ref Range   Glucose-Capillary 146 (H) 65 - 99 mg/dL  Glucose, capillary     Status: Abnormal   Collection Time: 03/26/15  3:30 AM  Result Value Ref Range   Glucose-Capillary 149 (H) 65 - 99 mg/dL  Troponin I     Status: None   Collection Time: 03/26/15  5:10 AM  Result Value Ref Range   Troponin I 0.03 <0.031 ng/mL    Comment:        NO INDICATION OF MYOCARDIAL INJURY.   Basic metabolic panel     Status: Abnormal   Collection Time: 03/26/15  5:10 AM  Result Value Ref Range   Sodium 130 (L) 135 - 145 mmol/L   Potassium 4.3 3.5 - 5.1 mmol/L   Chloride 99 (L) 101 - 111 mmol/L   CO2 22 22 - 32 mmol/L   Glucose, Bld 143 (H) 65 - 99 mg/dL   BUN 41 (H) 6 - 20 mg/dL   Creatinine, Ser 2.01 (H) 0.44 - 1.00 mg/dL   Calcium 7.7 (L) 8.9 - 10.3 mg/dL   GFR calc non Af Amer 22 (L) >60 mL/min   GFR calc Af Amer 26 (L) >60 mL/min    Comment: (NOTE) The eGFR has been calculated using the CKD EPI equation. This calculation has not been validated  in all clinical situations. eGFR's persistently <60 mL/min signify possible Chronic Kidney Disease.    Anion gap 9 5 - 15  Procalcitonin     Status: None   Collection Time: 03/26/15  5:10 AM  Result Value Ref Range   Procalcitonin 80.81 ng/mL    Comment:        Interpretation: PCT >= 10 ng/mL: Important systemic inflammatory response, almost exclusively due to severe bacterial sepsis or septic shock. (NOTE)         ICU PCT Algorithm               Non ICU PCT Algorithm    ----------------------------      ------------------------------         PCT < 0.25 ng/mL                 PCT < 0.1 ng/mL     Stopping of antibiotics            Stopping of antibiotics       strongly encouraged.               strongly encouraged.    ----------------------------     ------------------------------       PCT level decrease by               PCT < 0.25 ng/mL       >= 80% from peak PCT       OR PCT 0.25 - 0.5 ng/mL          Stopping of antibiotics                                             encouraged.     Stopping of antibiotics           encouraged.    ----------------------------     ------------------------------       PCT level decrease by              PCT >= 0.25 ng/mL       < 80% from peak PCT        AND PCT >= 0.5 ng/mL             Continuing antibiotics                                              encouraged.       Continuing antibiotics            encouraged.    ----------------------------     ------------------------------     PCT level increase compared          PCT > 0.5 ng/mL         with peak PCT AND          PCT >= 0.5 ng/mL             Escalation of antibiotics                                          strongly encouraged.      Escalation of antibiotics        strongly encouraged. Performed  at [facility]   CBC     Status: Abnormal   Collection Time: 03/26/15  5:10 AM  Result Value Ref Range   WBC 24.8 (H) 4.0 - 10.5 K/uL   RBC 2.90 (L) 3.87 - 5.11 MIL/uL   Hemoglobin 8.3 (L) 12.0 - 15.0 g/dL   HCT 26.4 (L) 36.0 - 46.0 %   MCV 91.0 78.0 - 100.0 fL   MCH 28.6 26.0 - 34.0 pg   MCHC 31.4 30.0 - 36.0 g/dL   RDW 12.7 11.5 - 15.5 %   Platelets 132 (L) 150 - 400 K/uL  Magnesium     Status: None   Collection Time: 03/26/15  5:10 AM  Result Value Ref Range   Magnesium 2.1 1.7 - 2.4 mg/dL  Phosphorus     Status: None   Collection Time: 03/26/15  5:10 AM  Result Value Ref Range   Phosphorus 2.8 2.5 - 4.6 mg/dL  Glucose, capillary     Status: Abnormal   Collection Time:  03/26/15  8:17 AM  Result Value Ref Range   Glucose-Capillary 124 (H) 65 - 99 mg/dL   Comment 1 Notify RN    Comment 2 Document in Chart     Imaging / Studies: US Renal  03/25/2015   CLINICAL DATA:  Acute renal failure.  EXAM: RENAL / URINARY TRACT ULTRASOUND COMPLETE  COMPARISON:  None.  FINDINGS: Right Kidney:  Length: 8.1 cm. Increased echogenicity consistent with chronic medical renal disease. No mass or hydronephrosis visualized.  Left Kidney:  Length: 9.3 cm. Increased echogenicity consistent chronic medical renal disease. No mass or hydronephrosis visualized.  Bladder:  Bladder decompressed by Foley catheter.  IMPRESSION: Bilateral echodense kidneys consistent chronic medical renal disease. No acute abnormality. No hydronephrosis or bladder distention.   Electronically Signed   By: Marcello Moores  Register   On: 03/25/2015 13:27   Dg Chest Port 1 View  03/26/2015   CLINICAL DATA:  Atelectasis  EXAM: PORTABLE CHEST - 1 VIEW  COMPARISON:  Yesterday  FINDINGS: NG tube and left PICC are stable. Upper normal heart size. Low volumes. Bibasilar atelectasis is not significantly changed. No pneumothorax.  IMPRESSION: Stable bibasilar atelectasis.   Electronically Signed   By: Marybelle Killings M.D.   On: 03/26/2015 08:14   Dg Chest Port 1 View  03/25/2015   CLINICAL DATA:  Tachycardia and hypotension; concern for sepsis  EXAM: PORTABLE CHEST - 1 VIEW  COMPARISON:  May 27, 2014  FINDINGS: There is patchy atelectasis in both lung bases. The degree of inspiration is shallow. There is no frank airspace consolidation. Heart is upper normal in size with pulmonary vascularity within normal limits.  Central catheter tip is in the superior vena cava. Nasogastric tube tip and side port are in the distal stomach. No pneumothorax.  IMPRESSION: Bibasilar atelectasis. No pneumothorax. Degree of inspiration shallow.   Electronically Signed   By: Lowella Grip III M.D.   On: 03/25/2015 09:03    Medications / Allergies: per  chart  Antibiotics: Anti-infectives    Start     Dose/Rate Route Frequency Ordered Stop   03/25/15 1600  cefOXitin (MEFOXIN) 1 g in dextrose 5 % 50 mL IVPB     1 g 100 mL/hr over 30 Minutes Intravenous Every 12 hours 03/25/15 0801     03/25/15 0800  cefOXitin (MEFOXIN) 1 g in dextrose 5 % 50 mL IVPB  Status:  Discontinued     1 g 100 mL/hr over 30 Minutes Intravenous 3 times  per day 03/25/15 0752 03/25/15 0758   03/24/15 1700  cefOXitin (MEFOXIN) 1 g in dextrose 5 % 50 mL IVPB     1 g 100 mL/hr over 30 Minutes Intravenous Every 6 hours 03/24/15 1511 03/25/15 0512   03/24/15 0915  cefOXitin (MEFOXIN) 2 g in dextrose 5 % 50 mL IVPB    Comments:  Pharmacy may adjust dosing strength, interval, or rate of medication as needed for optimal therapy for the patient Send with patient on call to the OR.  Anesthesia to complete antibiotic administration <45mn [facility]   2 g 100 mL/hr over 30 Minutes Intravenous On call to O.R. 03/24/15 0910 03/24/15 1042        Note: Portions of this report may have been transcribed using voice recognition software. Every effort was made to ensure accuracy; however, inadvertent computerized transcription errors may be present.   Any transcriptional errors that result from this process are unintentional.     SAdin Hector M.D., F.A.C.S. Gastrointestinal and Minimally Invasive [facility] [address] [place] [phone] / Paging   03/26/2015  CARE TEAM:  PCP: AWalker Kehr MD  Outpatient Care Team: Patient Care Team: ACassandria Anger MD as PCP - General WHurley Cisco MD as Attending Physician (Internal Medicine)  Inpatient Treatment Team: Treatment Team: Attending Provider: ARobbie Lis MD; Consulting Physician: MNolon Nations MD; Registered Nurse: DMartyn Malay RN; Rounding Team: WGarner Gavel MD; Registered Nurse: GVivi Martens RN;  Registered Nurse: NLolita Rieger RN; Technician: MWilder Glade NT; Rounding Team: Md Pccm, MD; Registered Nurse: SPhilis Pique RN; Registered Nurse: KCandie Chroman RN

## 2015-03-26 NOTE — Progress Notes (Signed)
PARENTERAL NUTRITION CONSULT NOTE - follow up  Pharmacy Consult for TPN Indication: bowel obstruction  Allergies  Allergen Reactions  . Amlodipine Besylate     REACTION: dizzy  . Aspirin   . Atenolol     REACTION: fatigue  . Benazepril     cough  . Benicar [Olmesartan Medoxomil]     HA  . Cozaar     nausea  . Hydrochlorothiazide W-Triamterene     REACTION: dizzy  . Hydrocodone     REACTION: HA  . Hydroxyzine Pamoate   . Iodine   . Lisinopril     REACTION: tired, cough  . Penicillins Itching    tolerates cephalosporins OK  . Pravastatin     myalgias  . Prednisolone     My stomach hurts  . Tramadol Hcl     REACTION: HA    Patient Measurements: Height: 5\' 4"  (162.6 cm) Weight: 151 lb 7.3 oz (68.7 kg) IBW/kg (Calculated) : 54.7 Adjusted Body Weight: 57.5kg   Vital Signs: Temp: 98.3 F (36.8 C) (05/14 0400) Temp Source: Oral (05/14 0400) BP: 145/68 mmHg (05/14 1100) Pulse Rate: 122 (05/14 1100) Intake/Output from previous day: 05/13 0701 - 05/14 0700 In: 4054.7 [I.V.:2306.8; IV Piggyback:200; TPN:1547.9] Out: 1815 [Urine:780; Emesis/NG output:950; Drains:85] Intake/Output from this shift: Total I/O In: 870 [I.V.:390; TPN:480] Out: 380 [Urine:380]  Labs:  Recent Labs  03/24/15 0533 03/25/15 0615 03/26/15 0510  WBC 6.1 24.8* 24.8*  HGB 9.1* 10.2* 8.3*  HCT 29.2* 31.7* 26.4*  PLT 247 182 132*     Recent Labs  03/24/15 0533 03/25/15 0615 03/25/15 1157 03/26/15 0510  NA 136 130*  --  130*  K 3.4* 5.0  --  4.3  CL 99* 99*  --  99*  CO2 27 19*  --  22  GLUCOSE 159* 154*  --  143*  BUN 22* 29*  --  41*  CREATININE 0.85 2.20*  --  2.01*  LABCREA  --   --  112.6  --   CALCIUM 8.4* 7.7*  --  7.7*  MG 1.4* 1.6*  --  2.1  PHOS 3.6 3.4  --  2.8  PROT 6.6  --   --   --   ALBUMIN 2.6*  --   --   --   AST 17  --   --   --   ALT 10*  --   --   --   ALKPHOS 63  --   --   --   BILITOT 0.5  --   --   --   PREALBUMIN 6.9*  --   --   --   TRIG 62 93   --   --    Estimated Creatinine Clearance: 21.6 mL/min (by C-G formula based on Cr of 2.01).    Recent Labs  03/26/15 0113 03/26/15 0330 03/26/15 0817  GLUCAP 146* 149* 124*    Medical History: Past Medical History  Diagnosis Date  . CAD (coronary artery disease)     s/p stenting of LAD 1999- cath 5-08 EF normal LAD 30-40% restenosis. D1 50% D2 80% LCX & RCA minimal plaque  . HTN (hypertension)   . Hyperlipemia   . Anemia     iron deficiency  . Depression   . DVT (deep venous thrombosis)   . Gout   . Osteoporosis   . Pancreatitis   . GERD (gastroesophageal reflux disease)   . Renal insufficiency     Cr 1.2-1.3  . Obesity   .  Diabetes mellitus   . Polyarthritis     DJD/ possible PMR  . Vitamin D deficiency   . B12 deficiency   . Tinnitus   . Anxiety   . Aneurysm, thoracic aortic   . Constipation   . Urinary frequency   . Vertigo   . Chronic back pain       Insulin Requirements: 9 units on 5/12  Current Nutrition: NPO  IVF: LR @ KVO  Central access: PICC 5/11 TPN start date: 5/11  ASSESSMENT                                                                                                          HPI: 81 year old female with history of hypertension, coronary artery disease, hyperlipidemia, iron deficiency anemia, depression, osteoporosis, mild chronic kidney disease presented to the ED with ongoing nausea for 4 days and diffuse crampy lower abdominal pain since one day prior to admission. Associated poor by mouth intake and generalized weakness. Patient was found to have small bowel obstruction.  Pharmacy consulted to start TPN.  Significant events:  5/12: diagnostic laparoscopy possible laparotomy 5/13: new AKI 5/14: still NPO; to continue NGT until flatus  Today:   Glucose - CBGs mostly < 150 with SSI  Electrolytes - K decreased to wnl; Mg, phos, CorrCa wnl.  Na sl low  Renal - new AKI, SCr improved today, UOP still minimal ~ 0.5 ml/kg/hr  LFTs  - WNL  TGs - wnl but increasing  Prealbumin low  NUTRITIONAL GOALS                                                                                             RD recs: 75-85 g/day protein, 1500-1700 Kcal/day. Clinimix E 5/15 at a goal rate of 83ml/hr + 20% fat emulsion at 61ml/hr to provide: 84 g/day protein, 1672 Kcal/day.  PLAN                                                                                                                         At 1800 today:  Continue Clinimix 5/15 without lytes at 70 ml/hr.  If lytes drop, will switch back to Clinimix E  Continue 20% fat emulsion at 24ml/hr.  TPN to contain standard multivitamins and trace elements.  Maintain IVF at [facility]  Continue mod SSI, may consider insulin in TPN once stable on higher rate  BMet, mag and phos with am labs  TPN lab panels on Mondays & Thursdays.  F/u daily.  Bernadene Person, PharmD Pager: [phone] 03/26/2015, 12:00 PM

## 2015-03-27 ENCOUNTER — Encounter (HOSPITAL_COMMUNITY): Payer: Self-pay | Admitting: Radiology

## 2015-03-27 ENCOUNTER — Inpatient Hospital Stay (HOSPITAL_COMMUNITY): Payer: Commercial Managed Care - HMO

## 2015-03-27 LAB — GLUCOSE, CAPILLARY
GLUCOSE-CAPILLARY: 154 mg/dL — AB (ref 65–99)
GLUCOSE-CAPILLARY: 146 mg/dL — AB (ref 65–99)
Glucose-Capillary: 105 mg/dL — ABNORMAL HIGH (ref 65–99)
Glucose-Capillary: 128 mg/dL — ABNORMAL HIGH (ref 65–99)
Glucose-Capillary: 130 mg/dL — ABNORMAL HIGH (ref 65–99)
Glucose-Capillary: 140 mg/dL — ABNORMAL HIGH (ref 65–99)

## 2015-03-27 LAB — MAGNESIUM: Magnesium: 2.1 mg/dL (ref 1.7–2.4)

## 2015-03-27 LAB — PROCALCITONIN: Procalcitonin: 42.12 ng/mL

## 2015-03-27 LAB — BASIC METABOLIC PANEL WITH GFR
Anion gap: 11 (ref 5–15)
BUN: 45 mg/dL — ABNORMAL HIGH (ref 6–20)
CO2: 22 mmol/L (ref 22–32)
Calcium: 8.2 mg/dL — ABNORMAL LOW (ref 8.9–10.3)
Chloride: 99 mmol/L — ABNORMAL LOW (ref 101–111)
Creatinine, Ser: 1.56 mg/dL — ABNORMAL HIGH (ref 0.44–1.00)
GFR calc Af Amer: 35 mL/min — ABNORMAL LOW
GFR calc non Af Amer: 30 mL/min — ABNORMAL LOW
Glucose, Bld: 117 mg/dL — ABNORMAL HIGH (ref 65–99)
Potassium: 4.1 mmol/L (ref 3.5–5.1)
Sodium: 132 mmol/L — ABNORMAL LOW (ref 135–145)

## 2015-03-27 LAB — PHOSPHORUS: PHOSPHORUS: 1.7 mg/dL — AB (ref 2.5–4.6)

## 2015-03-27 MED ORDER — SODIUM PHOSPHATE 3 MMOLE/ML IV SOLN
15.0000 mmol | Freq: Once | INTRAVENOUS | Status: AC
  Administered 2015-03-27: 15 mmol via INTRAVENOUS
  Filled 2015-03-27: qty 5

## 2015-03-27 MED ORDER — STROKE: EARLY STAGES OF RECOVERY BOOK
Freq: Once | Status: AC
  Administered 2015-03-27: 17:00:00
  Filled 2015-03-27: qty 1

## 2015-03-27 MED ORDER — STROKE: EARLY STAGES OF RECOVERY BOOK
Freq: Once | Status: AC
  Administered 2015-03-27: 20:00:00
  Filled 2015-03-27: qty 1

## 2015-03-27 MED ORDER — HYDRALAZINE HCL 20 MG/ML IJ SOLN
10.0000 mg | Freq: Four times a day (QID) | INTRAMUSCULAR | Status: DC | PRN
  Administered 2015-03-27 – 2015-04-16 (×4): 10 mg via INTRAVENOUS
  Filled 2015-03-27 (×5): qty 1

## 2015-03-27 MED ORDER — ASPIRIN 300 MG RE SUPP
300.0000 mg | Freq: Every day | RECTAL | Status: DC
  Administered 2015-03-27 – 2015-03-31 (×4): 300 mg via RECTAL
  Filled 2015-03-27 (×24): qty 1

## 2015-03-27 MED ORDER — FAT EMULSION 20 % IV EMUL
240.0000 mL | INTRAVENOUS | Status: AC
  Administered 2015-03-27: 240 mL via INTRAVENOUS
  Filled 2015-03-27: qty 250

## 2015-03-27 MED ORDER — ASPIRIN 325 MG PO TABS
325.0000 mg | ORAL_TABLET | Freq: Every day | ORAL | Status: DC
  Administered 2015-04-01 – 2015-04-20 (×18): 325 mg via ORAL
  Filled 2015-03-27 (×19): qty 1

## 2015-03-27 MED ORDER — ALTEPLASE 2 MG IJ SOLR
2.0000 mg | Freq: Once | INTRAMUSCULAR | Status: AC
  Administered 2015-03-27: 2 mg
  Filled 2015-03-27 (×2): qty 2

## 2015-03-27 MED ORDER — TRACE MINERALS CR-CU-F-FE-I-MN-MO-SE-ZN IV SOLN
INTRAVENOUS | Status: AC
  Administered 2015-03-27: 20:00:00 via INTRAVENOUS
  Filled 2015-03-27: qty 1680

## 2015-03-27 NOTE — Progress Notes (Signed)
Orders for STAT EKG, PT, and PTT were entered per Code Stroke protocol. Bosie Helper, RN

## 2015-03-27 NOTE — Progress Notes (Signed)
Patient noted to have right sided facial droop present and right arm weakness around 1520. Patient unable to grip with right hand and more lethargic. Dr. Elisabeth Pigeon paged and made aware of mental status change and other assessment findings. Order written for stat CT of head without contrast. PRN hydralazine given for blood pressure 165/70 per Dr. Elisabeth Pigeon order. Will continue to monitor. Patient family members present at bedside and updated on patient status.

## 2015-03-27 NOTE — Progress Notes (Signed)
[address] [place], [place] [address] Phone: [phone] FAX: [phone]   [name] [id number] 1933-11-03   Problem List:   Principal Problem:   Small bowel obstruction s/p exlap/LOA/decompression 03/25/2015 Active Problems:   Diabetes type 2, controlled   Anxiety state   Adjustment disorder with mixed anxiety and depressed mood   Coronary atherosclerosis   GERD   Abdominal pain   Polymyalgia rheumatica   Chronic fatigue disorder   Knee pain, bilateral   Elevated blood pressure   Malnutrition of moderate degree   Acute delirium   3 Days Post-Op  Procedure(s): LAPAROSCOPY DIAGNOSTIC LAPAROSCOPIC LYSIS OF ADHESIONS LAPAROTOMY with decompression of bowel  Assessment  OK  Plan:  -NGT until BM.  842m dark bilious out c/w ileus.  Pt cannot tell if she has flatus  -TNA for malnutrition & prolonged ileus  -control HTN & inc HR - low dose metoprolol.  Could be marker of anxiety/pain - follow  -pain control - ice/heat as needed.  No enteral option w ileus.  Daughter insists on limiting medication usage to q4h fentanyl only.  I am concerned that will not be adequate in a pt requiring emergent surgery with SBO & ileus with many lines/tubes needed.  D/w ICU RNs whom are concerned as well.  See what primary service thinks   -anxiolysis/delerium - support only for now since daughter wishes no meds.  Consider transfer to floor w less disruptive environment if can be near nursing station.  Challenge  -delerium probably w emergent surgery & adv age w h/o BZ use/anxiety - not severe. follow in SDU.  Daughter wishes to dictate care & withhold all these meds instead of trusting the ICU RNs to give the PRN meds appropriately in the SDU.   -d/c drain    -foley until more mobile  -VTE prophylaxis- SCDs, etc  -mobilize as tolerated to help recovery  -dispo - PT/OT evals.  Probable SNF at this point unless improves  markedly  SAdin Hector M.D., F.A.C.S. Gastrointestinal and Minimally Invasive [facility] [id number] [place] [id number]([phone] / Paging   03/27/2015  Subjective:  Actually slept w ativan last night x1 dose.  Daughter apparently mad that pt received BZ & wishes them d/c'd Pt more calm but wants to eat  In SDU  Objective:  Vital signs:  Filed Vitals:   03/27/15 0100 03/27/15 0200 03/27/15 0300 03/27/15 0400  BP: 127/55 169/64 153/60   Pulse: 104     Temp:    98.6 F (37 C)  TempSrc:    Oral  Resp: _0 Height:      Weight:    67.7 kg (149 lb 4 oz)  SpO2: 98%       Last BM Date: 03/23/15  Intake/Output   Yesterday:  05/14 0701 - 05/15 0700 In: 2040 [I.V.:460; IV Piggyback:450; TPN:1040] Out: 26948[Urine:1925; Emesis/NG output:850; Drains:70] This shift:     Bowel function:  Flatus: ?n  BM: n  Drain: thin green NGT output  Physical Exam:  General: Pt awake/alert/oriented x1 in no acute distress Eyes: PERRL, normal EOM.  Sclera clear.  No icterus Neuro: CN II-XII intact w/o focal sensory/motor deficits. Lymph: No head/neck/groin lymphadenopathy Psych:  Some delerium.  Not agitated - more calm today HENT: Normocephalic, Mucus membranes moist.  No thrush Neck: Supple, No tracheal deviation Chest: No chest wall pain w  good excursion CV:  Pulses intact.  Regular rhythm MS: Normal AROM mjr joints.  No obvious deformity Abdomen: Soft.  Mildly distended.  Mildly tender at incision only.  No evidence of peritonitis.  No incarcerated hernias. Ext:  SCDs BLE.  No mjr edema.  No cyanosis Skin: No petechiae / purpura  Results:   Labs: Results for orders placed or performed during the hospital encounter of 03/18/15 (from the past 48 hour(s))  Lactic acid, plasma     Status: Abnormal   Collection Time: 03/25/15  9:26 AM  Result Value Ref Range   Lactic Acid, Venous 2.2 (HH) 0.5 - 2.0  mmol/L    Comment: REPEATED TO VERIFY CRITICAL RESULT CALLED TO, READ BACK BY AND VERIFIED WITH: SHAFFER,S @ 1038 ON [id number] BY POTEAT,S   Procalcitonin     Status: None   Collection Time: 03/25/15  9:26 AM  Result Value Ref Range   Procalcitonin >175.00 ng/mL    Comment:        Interpretation: PCT >= 10 ng/mL: Important systemic inflammatory response, almost exclusively due to severe bacterial sepsis or septic shock. (NOTE)         ICU PCT Algorithm               Non ICU PCT Algorithm    ----------------------------     ------------------------------         PCT < 0.25 ng/mL                 PCT < 0.1 ng/mL     Stopping of antibiotics            Stopping of antibiotics       strongly encouraged.               strongly encouraged.    ----------------------------     ------------------------------       PCT level decrease by               PCT < 0.25 ng/mL       >= 80% from peak PCT       OR PCT 0.25 - 0.5 ng/mL          Stopping of antibiotics                                             encouraged.     Stopping of antibiotics           encouraged.    ----------------------------     ------------------------------       PCT level decrease by              PCT >= 0.25 ng/mL       < 80% from peak PCT        AND PCT >= 0.5 ng/mL             Continuing antibiotics                                              encouraged.       Continuing antibiotics            encouraged.    ----------------------------     ------------------------------     PCT level increase compared  PCT > 0.5 ng/mL         with peak PCT AND          PCT >= 0.5 ng/mL             Escalation of antibiotics                                          strongly encouraged.      Escalation of antibiotics        strongly encouraged. Performed at [facility]   Culture, blood (x 2)     Status: None (Preliminary result)   Collection Time: 03/25/15  9:30 AM  Result Value Ref Range   Specimen Description  BLOOD RIGHT HAND    Special Requests BOTTLES DRAWN AEROBIC ONLY 5CC    Culture             BLOOD CULTURE RECEIVED NO GROWTH TO DATE CULTURE WILL BE HELD FOR 5 DAYS BEFORE ISSUING A FINAL NEGATIVE REPORT Performed at [facility]    Report Status PENDING   Culture, blood (x 2)     Status: None (Preliminary result)   Collection Time: 03/25/15  9:35 AM  Result Value Ref Range   Specimen Description BLOOD RIGHT ARM    Special Requests BOTTLES DRAWN AEROBIC AND ANAEROBIC 6CC    Culture             BLOOD CULTURE RECEIVED NO GROWTH TO DATE CULTURE WILL BE HELD FOR 5 DAYS BEFORE ISSUING A FINAL NEGATIVE REPORT Performed at [facility]    Report Status PENDING   Blood gas, arterial     Status: Abnormal   Collection Time: 03/25/15 10:45 AM  Result Value Ref Range   O2 Content 2.0 L/min   Delivery systems NASAL CANNULA    pH, Arterial 7.403 7.350 - 7.450   pCO2 arterial 32.8 (L) 35.0 - 45.0 mmHg   pO2, Arterial 103 (H) 80.0 - 100.0 mmHg   Bicarbonate 20.0 20.0 - 24.0 mEq/L   TCO2 18.6 0 - 100 mmol/L   Acid-base deficit 3.6 (H) 0.0 - 2.0 mmol/L   O2 Saturation 97.0 %   Patient temperature 98.6    Collection site RIGHT RADIAL    Drawn by [id number]    Sample type ARTERIAL DRAW    Allens test (pass/fail) PASS PASS  Glucose, capillary     Status: Abnormal   Collection Time: 03/25/15 11:20 AM  Result Value Ref Range   Glucose-Capillary 148 (H) 65 - 99 mg/dL  Sodium, urine, random     Status: None   Collection Time: 03/25/15 11:57 AM  Result Value Ref Range   Sodium, Ur 23 mEq/L    Comment: Performed at [facility]  Creatinine, urine, random     Status: None   Collection Time: 03/25/15 11:57 AM  Result Value Ref Range   Creatinine, Urine 112.6 mg/dL    Comment: No reference range established. Performed at [facility]   Urinalysis, Routine w reflex microscopic     Status: Abnormal   Collection Time: 03/25/15 11:57 AM  Result Value Ref Range   Color,  Urine YELLOW YELLOW   APPearance CLOUDY (A) CLEAR   Specific Gravity, Urine 1.023 1.005 - 1.030   pH 5.0 5.0 - 8.0   Glucose, UA NEGATIVE NEGATIVE mg/dL   Hgb urine dipstick NEGATIVE NEGATIVE  Bilirubin Urine NEGATIVE NEGATIVE   Ketones, ur NEGATIVE NEGATIVE mg/dL   Protein, ur 30 (A) NEGATIVE mg/dL   Urobilinogen, UA 0.2 0.0 - 1.0 mg/dL   Nitrite NEGATIVE NEGATIVE   Leukocytes, UA NEGATIVE NEGATIVE  Culture, Urine     Status: None   Collection Time: 03/25/15 11:57 AM  Result Value Ref Range   Specimen Description URINE, CATHETERIZED    Special Requests NONE    Colony Count NO GROWTH Performed at [facility]     Culture NO GROWTH Performed at [facility]     Report Status 03/26/2015 FINAL   Urine microscopic-add on     Status: Abnormal   Collection Time: 03/25/15 11:57 AM  Result Value Ref Range   Squamous Epithelial / LPF RARE RARE   WBC, UA 3-6 <3 WBC/hpf   RBC / HPF 7-10 <3 RBC/hpf   Bacteria, UA MANY (A) RARE   Urine-Other MUCOUS PRESENT   Lactic acid, plasma     Status: Abnormal   Collection Time: 03/25/15  1:15 PM  Result Value Ref Range   Lactic Acid, Venous 2.3 (HH) 0.5 - 2.0 mmol/L    Comment: REPEATED TO VERIFY CRITICAL RESULT CALLED TO, READ BACK BY AND VERIFIED WITH: SHAFFER,S @ 1458 ON [id number] BY POTEAT,S   Troponin I     Status: None   Collection Time: 03/25/15  1:15 PM  Result Value Ref Range   Troponin I 0.03 <0.031 ng/mL    Comment:        NO INDICATION OF MYOCARDIAL INJURY.   Procalcitonin - Baseline     Status: None   Collection Time: 03/25/15  1:15 PM  Result Value Ref Range   Procalcitonin 147.10 ng/mL    Comment:        Interpretation: PCT >= 10 ng/mL: Important systemic inflammatory response, almost exclusively due to severe bacterial sepsis or septic shock. (NOTE)         ICU PCT Algorithm               Non ICU PCT Algorithm    ----------------------------     ------------------------------         PCT < 0.25 ng/mL                  PCT < 0.1 ng/mL     Stopping of antibiotics            Stopping of antibiotics       strongly encouraged.               strongly encouraged.    ----------------------------     ------------------------------       PCT level decrease by               PCT < 0.25 ng/mL       >= 80% from peak PCT       OR PCT 0.25 - 0.5 ng/mL          Stopping of antibiotics                                             encouraged.     Stopping of antibiotics           encouraged.    ----------------------------     ------------------------------       PCT level decrease by  PCT >= 0.25 ng/mL       < 80% from peak PCT        AND PCT >= 0.5 ng/mL             Continuing antibiotics                                              encouraged.       Continuing antibiotics            encouraged.    ----------------------------     ------------------------------     PCT level increase compared          PCT > 0.5 ng/mL         with peak PCT AND          PCT >= 0.5 ng/mL             Escalation of antibiotics                                          strongly encouraged.      Escalation of antibiotics        strongly encouraged. Performed at [facility]   Glucose, capillary     Status: Abnormal   Collection Time: 03/25/15  4:16 PM  Result Value Ref Range   Glucose-Capillary 114 (H) 65 - 99 mg/dL   Comment 1 Notify RN    Comment 2 Document in Chart   Glucose, capillary     Status: Abnormal   Collection Time: 03/25/15  7:53 PM  Result Value Ref Range   Glucose-Capillary 141 (H) 65 - 99 mg/dL  Troponin I     Status: None   Collection Time: 03/25/15 10:23 PM  Result Value Ref Range   Troponin I 0.03 <0.031 ng/mL    Comment:        NO INDICATION OF MYOCARDIAL INJURY.   Glucose, capillary     Status: Abnormal   Collection Time: 03/26/15  1:13 AM  Result Value Ref Range   Glucose-Capillary 146 (H) 65 - 99 mg/dL  Glucose, capillary     Status: Abnormal   Collection Time:  03/26/15  3:30 AM  Result Value Ref Range   Glucose-Capillary 149 (H) 65 - 99 mg/dL  Troponin I     Status: None   Collection Time: 03/26/15  5:10 AM  Result Value Ref Range   Troponin I 0.03 <0.031 ng/mL    Comment:        NO INDICATION OF MYOCARDIAL INJURY.   Basic metabolic panel     Status: Abnormal   Collection Time: 03/26/15  5:10 AM  Result Value Ref Range   Sodium 130 (L) 135 - 145 mmol/L   Potassium 4.3 3.5 - 5.1 mmol/L   Chloride 99 (L) 101 - 111 mmol/L   CO2 22 22 - 32 mmol/L   Glucose, Bld 143 (H) 65 - 99 mg/dL   BUN 41 (H) 6 - 20 mg/dL   Creatinine, Ser 2.01 (H) 0.44 - 1.00 mg/dL   Calcium 7.7 (L) 8.9 - 10.3 mg/dL   GFR calc non Af Amer 22 (L) >60 mL/min   GFR calc Af Amer 26 (L) >60 mL/min    Comment: (NOTE) The eGFR has been calculated  using the CKD EPI equation. This calculation has not been validated in all clinical situations. eGFR's persistently <60 mL/min signify possible Chronic Kidney Disease.    Anion gap 9 5 - 15  Procalcitonin     Status: None   Collection Time: 03/26/15  5:10 AM  Result Value Ref Range   Procalcitonin 80.81 ng/mL    Comment:        Interpretation: PCT >= 10 ng/mL: Important systemic inflammatory response, almost exclusively due to severe bacterial sepsis or septic shock. (NOTE)         ICU PCT Algorithm               Non ICU PCT Algorithm    ----------------------------     ------------------------------         PCT < 0.25 ng/mL                 PCT < 0.1 ng/mL     Stopping of antibiotics            Stopping of antibiotics       strongly encouraged.               strongly encouraged.    ----------------------------     ------------------------------       PCT level decrease by               PCT < 0.25 ng/mL       >= 80% from peak PCT       OR PCT 0.25 - 0.5 ng/mL          Stopping of antibiotics                                             encouraged.     Stopping of antibiotics           encouraged.     ----------------------------     ------------------------------       PCT level decrease by              PCT >= 0.25 ng/mL       < 80% from peak PCT        AND PCT >= 0.5 ng/mL             Continuing antibiotics                                              encouraged.       Continuing antibiotics            encouraged.    ----------------------------     ------------------------------     PCT level increase compared          PCT > 0.5 ng/mL         with peak PCT AND          PCT >= 0.5 ng/mL             Escalation of antibiotics                                          strongly encouraged.      Escalation of  antibiotics        strongly encouraged. Performed at [facility]   CBC     Status: Abnormal   Collection Time: 03/26/15  5:10 AM  Result Value Ref Range   WBC 24.8 (H) 4.0 - 10.5 K/uL   RBC 2.90 (L) 3.87 - 5.11 MIL/uL   Hemoglobin 8.3 (L) 12.0 - 15.0 g/dL   HCT 66.7 (L) 30.4 - 55.5 %   MCV 91.0 78.0 - 100.0 fL   MCH 28.6 26.0 - 34.0 pg   MCHC 31.4 30.0 - 36.0 g/dL   RDW 50.3 55.9 - 21.8 %   Platelets 132 (L) 150 - 400 K/uL  Magnesium     Status: None   Collection Time: 03/26/15  5:10 AM  Result Value Ref Range   Magnesium 2.1 1.7 - 2.4 mg/dL  Phosphorus     Status: None   Collection Time: 03/26/15  5:10 AM  Result Value Ref Range   Phosphorus 2.8 2.5 - 4.6 mg/dL  Glucose, capillary     Status: Abnormal   Collection Time: 03/26/15  8:17 AM  Result Value Ref Range   Glucose-Capillary 124 (H) 65 - 99 mg/dL   Comment 1 Notify RN    Comment 2 Document in Chart   Glucose, capillary     Status: Abnormal   Collection Time: 03/26/15 11:52 AM  Result Value Ref Range   Glucose-Capillary 112 (H) 65 - 99 mg/dL  Glucose, capillary     Status: Abnormal   Collection Time: 03/26/15  5:01 PM  Result Value Ref Range   Glucose-Capillary 128 (H) 65 - 99 mg/dL  Glucose, capillary     Status: Abnormal   Collection Time: 03/26/15  8:32 PM  Result Value Ref Range    Glucose-Capillary 117 (H) 65 - 99 mg/dL   Comment 1 Notify RN    Comment 2 Document in Chart   Glucose, capillary     Status: Abnormal   Collection Time: 03/27/15 12:26 AM  Result Value Ref Range   Glucose-Capillary 140 (H) 65 - 99 mg/dL   Comment 1 Notify RN    Comment 2 Document in Chart   Basic metabolic panel     Status: Abnormal   Collection Time: 03/27/15  4:04 AM  Result Value Ref Range   Sodium 132 (L) 135 - 145 mmol/L   Potassium 4.1 3.5 - 5.1 mmol/L   Chloride 99 (L) 101 - 111 mmol/L   CO2 22 22 - 32 mmol/L   Glucose, Bld 117 (H) 65 - 99 mg/dL   BUN 45 (H) 6 - 20 mg/dL   Creatinine, Ser 0.69 (H) 0.44 - 1.00 mg/dL   Calcium 8.2 (L) 8.9 - 10.3 mg/dL   GFR calc non Af Amer 30 (L) >60 mL/min   GFR calc Af Amer 35 (L) >60 mL/min    Comment: (NOTE) The eGFR has been calculated using the CKD EPI equation. This calculation has not been validated in all clinical situations. eGFR's persistently <60 mL/min signify possible Chronic Kidney Disease.    Anion gap 11 5 - 15  Magnesium     Status: None   Collection Time: 03/27/15  4:04 AM  Result Value Ref Range   Magnesium 2.1 1.7 - 2.4 mg/dL  Phosphorus     Status: Abnormal   Collection Time: 03/27/15  4:04 AM  Result Value Ref Range   Phosphorus 1.7 (L) 2.5 - 4.6 mg/dL    Imaging / Studies: US Renal  03/25/2015  CLINICAL DATA:  Acute renal failure.  EXAM: RENAL / URINARY TRACT ULTRASOUND COMPLETE  COMPARISON:  None.  FINDINGS: Right Kidney:  Length: 8.1 cm. Increased echogenicity consistent with chronic medical renal disease. No mass or hydronephrosis visualized.  Left Kidney:  Length: 9.3 cm. Increased echogenicity consistent chronic medical renal disease. No mass or hydronephrosis visualized.  Bladder:  Bladder decompressed by Foley catheter.  IMPRESSION: Bilateral echodense kidneys consistent chronic medical renal disease. No acute abnormality. No hydronephrosis or bladder distention.   Electronically Signed   By: Marcello Moores   Register   On: 03/25/2015 13:27   Dg Chest Port 1 View  03/26/2015   CLINICAL DATA:  Atelectasis  EXAM: PORTABLE CHEST - 1 VIEW  COMPARISON:  Yesterday  FINDINGS: NG tube and left PICC are stable. Upper normal heart size. Low volumes. Bibasilar atelectasis is not significantly changed. No pneumothorax.  IMPRESSION: Stable bibasilar atelectasis.   Electronically Signed   By: Marybelle Killings M.D.   On: 03/26/2015 08:14   Dg Chest Port 1 View  03/25/2015   CLINICAL DATA:  Tachycardia and hypotension; concern for sepsis  EXAM: PORTABLE CHEST - 1 VIEW  COMPARISON:  May 27, 2014  FINDINGS: There is patchy atelectasis in both lung bases. The degree of inspiration is shallow. There is no frank airspace consolidation. Heart is upper normal in size with pulmonary vascularity within normal limits.  Central catheter tip is in the superior vena cava. Nasogastric tube tip and side port are in the distal stomach. No pneumothorax.  IMPRESSION: Bibasilar atelectasis. No pneumothorax. Degree of inspiration shallow.   Electronically Signed   By: Lowella Grip III M.D.   On: 03/25/2015 09:03    Medications / Allergies: per chart  Antibiotics: Anti-infectives    Start     Dose/Rate Route Frequency Ordered Stop   03/26/15 1600  cefTRIAXone (ROCEPHIN) 2 g in dextrose 5 % 50 mL IVPB - Premix    Comments:  Pharmacy may adjust dosing strength / duration / interval for maximal efficacy   2 g 100 mL/hr over 30 Minutes Intravenous Every 24 hours 03/26/15 1007     03/26/15 1200  metroNIDAZOLE (FLAGYL) IVPB 500 mg     500 mg 100 mL/hr over 60 Minutes Intravenous Every 6 hours 03/26/15 1007     03/25/15 1600  cefOXitin (MEFOXIN) 1 g in dextrose 5 % 50 mL IVPB  Status:  Discontinued     1 g 100 mL/hr over 30 Minutes Intravenous Every 12 hours 03/25/15 0801 03/26/15 1007   03/25/15 0800  cefOXitin (MEFOXIN) 1 g in dextrose 5 % 50 mL IVPB  Status:  Discontinued     1 g 100 mL/hr over 30 Minutes Intravenous 3 times per day  03/25/15 0752 03/25/15 0758   03/24/15 1700  cefOXitin (MEFOXIN) 1 g in dextrose 5 % 50 mL IVPB     1 g 100 mL/hr over 30 Minutes Intravenous Every 6 hours 03/24/15 1511 03/25/15 0512   03/24/15 0915  cefOXitin (MEFOXIN) 2 g in dextrose 5 % 50 mL IVPB    Comments:  Pharmacy may adjust dosing strength, interval, or rate of medication as needed for optimal therapy for the patient Send with patient on call to the OR.  Anesthesia to complete antibiotic administration <14min prior to incision per [facility].   2 g 100 mL/hr over 30 Minutes Intravenous On call to O.R. 03/24/15 0910 03/24/15 1042        Note: Portions of this report may have been  transcribed using voice recognition software. Every effort was made to ensure accuracy; however, inadvertent computerized transcription errors may be present.   Any transcriptional errors that result from this process are unintentional.     Adin Hector, M.D., F.A.C.S. Gastrointestinal and Minimally Invasive Surgery [facility] [address], [place], [place] [address] [phone] Main / Paging   03/27/2015  CARE TEAM:  PCP: Walker Kehr, MD  Outpatient Care Team: Patient Care Team: Cassandria Anger, MD as PCP - General Hurley Cisco, MD as Attending Physician (Internal Medicine)  Inpatient Treatment Team: Treatment Team: Attending Provider: Robbie Lis, MD; Consulting Physician: Nolon Nations, MD; Registered Nurse: Martyn Malay, RN; Rounding Team: Garner Gavel, MD; Registered Nurse: Vivi Martens, RN; Registered Nurse: Lolita Rieger, RN; Technician: Wilder Glade, NT; Registered Nurse: Philis Pique, RN; Registered Nurse: Candie Chroman, RN

## 2015-03-27 NOTE — Consult Note (Signed)
Stroke Consult    Chief Complaint: stroke on CT HPI: [name] is an 81 y.o. female hx of HTN, CAD, DM, HLD admitted 5/06 with diffuse abdominal pain found tohave SBO, underwent laparoscopic surgery on 5/12. Post op complicated by sepsis due to peritoneal contamination. On 5/15 noted to have right facial droop and right sided weakness. CT head ordered which showed possible acute lacunar infarct in left lentiform nucleus (imaging reviewed).   Per RN, the patients right sided weakness and facial droop are markedly improved compared to earlier in the day. She remains lethargic though mental status has also improved slightly.   Labs pertinent for sodium 130, BUN 41, Cr 2.01, WBC 24.8.   Past Medical History  Diagnosis Date  . CAD (coronary artery disease)     s/p stenting of LAD 1999- cath 5-08 EF normal LAD 30-40% restenosis. D1 50% D2 80% LCX & RCA minimal plaque  . HTN (hypertension)   . Hyperlipemia   . Anemia     iron deficiency  . Depression   . DVT (deep venous thrombosis)   . Gout   . Osteoporosis   . Pancreatitis   . GERD (gastroesophageal reflux disease)   . Renal insufficiency     Cr 1.2-1.3  . Obesity   . Diabetes mellitus   . Polyarthritis     DJD/ possible PMR  . Vitamin D deficiency   . B12 deficiency   . Tinnitus   . Anxiety   . Aneurysm, thoracic aortic   . Constipation   . Urinary frequency   . Vertigo   . Chronic back pain     Past Surgical History  Procedure Laterality Date  . Hemorrhoid surgery    . Abdominal hysterectomy    . Cholecystectomy    . Tubal ligation    . Coronary angioplasty with stent placement  1999    LAD stent  . Cardiac catheterization  2008    L main 20%, LAD stent patent, D1 50%, D2 80% (small), RCA 20%, EF 55-60%  . Laparoscopy N/A 03/24/2015    Procedure: LAPAROSCOPY DIAGNOSTIC;  Surgeon: Luretha Murphy, MD;  Location: [facility];  Service: General;  Laterality: N/A;  . Laparoscopic lysis of adhesions N/A 03/24/2015     Procedure: LAPAROSCOPIC LYSIS OF ADHESIONS;  Surgeon: Luretha Murphy, MD;  Location: [facility];  Service: General;  Laterality: N/A;  . Laparotomy N/A 03/24/2015    Procedure: LAPAROTOMY with decompression of bowel;  Surgeon: Luretha Murphy, MD;  Location: [facility];  Service: General;  Laterality: N/A;    Family History  Problem Relation Age of Onset  . Adopted: Yes  . Diabetes Mother   . Hypertension Father    Social History:  reports that she has never smoked. She does not have any smokeless tobacco history on file. She reports that she does not drink alcohol or use illicit drugs.  Allergies:  Allergies  Allergen Reactions  . Amlodipine Besylate     REACTION: dizzy  . Aspirin   . Atenolol     REACTION: fatigue  . Benazepril     cough  . Benicar [Olmesartan Medoxomil]     HA  . Cozaar     nausea  . Hydrochlorothiazide W-Triamterene     REACTION: dizzy  . Hydrocodone     REACTION: HA  . Hydroxyzine Pamoate   . Iodine   . Lisinopril     REACTION: tired, cough  . Penicillins Itching    tolerates cephalosporins OK  .  Pravastatin     myalgias  . Prednisolone     My stomach hurts  . Tramadol Hcl     REACTION: HA    Facility-administered medications prior to admission  Medication Dose Route Frequency Provider Last Rate Last Dose  . methylPREDNISolone acetate (DEPO-MEDROL) injection 80 mg  80 mg Intramuscular Q30 days Aleksei Plotnikov V, MD      . methylPREDNISolone acetate (DEPO-MEDROL) injection 80 mg  80 mg Intra-articular Once Aleksei Plotnikov V, MD      . methylPREDNISolone acetate PF (DEPO-MEDROL) injection 80 mg  80 mg Intra-articular Once Jacinta Shoe V, MD       Medications Prior to Admission  Medication Sig Dispense Refill  . acetaminophen (TYLENOL) 650 MG CR tablet Take 1,300 mg by mouth every 8 (eight) hours as needed for pain.     [name] aspirin 325 MG tablet Take 325 mg by mouth daily.    . cholecalciferol (VITAMIN D) 1000 UNITS tablet Take 2 tablets (2,000  Units total) by mouth daily. 200 tablet 3  . Cyanocobalamin (VITAMIN B-12) 1000 MCG SUBL Place 1 tablet (1,000 mcg total) under the tongue daily. 100 tablet 3  . furosemide (LASIX) 20 MG tablet Take 1 tablet (20 mg total) by mouth daily as needed for edema. (Patient taking differently: Take 20 mg by mouth daily. ) 30 tablet 0  . metFORMIN (GLUCOPHAGE) 500 MG tablet TAKE 1 TABLET TWICE DAILY  WITH  A  MEAL 180 tablet 3  . OVER THE COUNTER MEDICATION Apply 1 application topically 2 (two) times daily as needed (dryness). Diabetic Lotion.    [name] oxybutynin (DITROPAN) 5 MG tablet Take 1 tablet (5 mg total) by mouth 2 (two) times daily. 180 tablet 3  . Polyethyl Glycol-Propyl Glycol (SYSTANE OP) Apply 1-2 drops to eye daily as needed (dry eyes).    . LORazepam (ATIVAN) 1 MG tablet Take 0.5 tablets (0.5 mg total) by mouth every 8 (eight) hours as needed for anxiety. (Patient not taking: Reported on 03/18/2015) 20 tablet 0    ROS: Out of a complete 14 system review, the patient complains of only the following symptoms, and all other reviewed systems are negative. Unable to obtain due to mental status   Physical Examination: Filed Vitals:   03/27/15 2000  BP: 160/69  Pulse: 102  Temp:   Resp: 20   Physical Exam  Constitutional: He appears well-developed and well-nourished.  Psych: Affect appropriate to situation Eyes: No scleral injection HENT: No OP obstrucion Head: Normocephalic.  Cardiovascular: Normal rate and regular rhythm.  Respiratory: Effort normal and breath sounds normal.  GI: Soft. Bowel sounds are normal. No distension. There is no tenderness.  Skin: WDI   Neurologic Examination: Mental Status: Lethargic but opens eyes to voice. Minimal nonsensical verbal output. Dysarthria noted.  Cranial Nerves: II: optic discs not visualized, blinks to threat bilaterally, pupils equal, round, reactive to light  III,IV, VI: ptosis not present, extra-ocular motions intact  bilaterally V,VII: mild flattening of right NLF, unable to test facial sensation VIII: hearing normal bilaterally IX,X: gag reflex present XI: unable to test XII: unable to test  Motor: Unable to formally test due to mental status but appears to move all extremities against gravity and light resistance. Possible slightly decreased movement in RUE Tone and bulk:normal tone throughout; no atrophy noted Sensory: withdrawals to noxious stimuli in all extremities symmetrically Deep Tendon Reflexes: 2+ and symmetric throughout Plantars: Right: downgoing   Left: downgoing Cerebellar: Unable to test Gait: unable to  test  Laboratory Studies:   Basic Metabolic Panel:  Recent Labs Lab 03/23/15 1045 03/24/15 0533 03/25/15 0615 03/26/15 0510 03/27/15 0404  NA 137 136 130* 130* 132*  K 3.9 3.4* 5.0 4.3 4.1  CL 102 99* 99* 99* 99*  CO2 22 27 19* 22 22  GLUCOSE 137* 159* 154* 143* 117*  BUN 21* 22* 29* 41* 45*  CREATININE 0.87 0.85 2.20* 2.01* 1.56*  CALCIUM 8.7* 8.4* 7.7* 7.7* 8.2*  MG  --  1.4* 1.6* 2.1 2.1  PHOS  --  3.6 3.4 2.8 1.7*    Liver Function Tests:  Recent Labs Lab 03/24/15 0533  AST 17  ALT 10*  ALKPHOS 63  BILITOT 0.5  PROT 6.6  ALBUMIN 2.6*   No results for input(s): LIPASE, AMYLASE in the last 168 hours. No results for input(s): AMMONIA in the last 168 hours.  CBC:  Recent Labs Lab 03/23/15 1000 03/24/15 0533 03/25/15 0615 03/26/15 0510  WBC 7.5 6.1 24.8* 24.8*  NEUTROABS 5.7 4.6 23.9*  --   HGB 11.2* 9.1* 10.2* 8.3*  HCT 35.9* 29.2* 31.7* 26.4*  MCV 94.2 92.7 91.9 91.0  PLT 309 247 182 132*    Cardiac Enzymes:  Recent Labs Lab 03/25/15 1315 03/25/15 2223 03/26/15 0510  TROPONINI 0.03 0.03 0.03    BNP: Invalid input(s): POCBNP  CBG:  Recent Labs Lab 03/26/15 2032 03/27/15 0026 03/27/15 0743 03/27/15 1150 03/27/15 1625  GLUCAP 117* 140* 154* 128* 146*    Microbiology: Results for orders placed or performed during the  hospital encounter of 03/18/15  MRSA PCR Screening     Status: None   Collection Time: 03/24/15  8:52 AM  Result Value Ref Range Status   MRSA by PCR NEGATIVE NEGATIVE Final    Comment:        The GeneXpert MRSA Assay (FDA approved for NASAL specimens only), is one component of a comprehensive MRSA colonization surveillance program. It is not intended to diagnose MRSA infection nor to guide or monitor treatment for MRSA infections.   Culture, blood (x 2)     Status: None (Preliminary result)   Collection Time: 03/25/15  9:30 AM  Result Value Ref Range Status   Specimen Description BLOOD RIGHT HAND  Final   Special Requests BOTTLES DRAWN AEROBIC ONLY 5CC  Final   Culture   Final           BLOOD CULTURE RECEIVED NO GROWTH TO DATE CULTURE WILL BE HELD FOR 5 DAYS BEFORE ISSUING A FINAL NEGATIVE REPORT Performed at [facility]    Report Status PENDING  Incomplete  Culture, blood (x 2)     Status: None (Preliminary result)   Collection Time: 03/25/15  9:35 AM  Result Value Ref Range Status   Specimen Description BLOOD RIGHT ARM  Final   Special Requests BOTTLES DRAWN AEROBIC AND ANAEROBIC 6CC  Final   Culture   Final           BLOOD CULTURE RECEIVED NO GROWTH TO DATE CULTURE WILL BE HELD FOR 5 DAYS BEFORE ISSUING A FINAL NEGATIVE REPORT Performed at [facility]    Report Status PENDING  Incomplete  Culture, Urine     Status: None   Collection Time: 03/25/15 11:57 AM  Result Value Ref Range Status   Specimen Description URINE, CATHETERIZED  Final   Special Requests NONE  Final   Colony Count NO GROWTH Performed at [facility]   Final   Culture NO GROWTH Performed  at [facility]   Final   Report Status 03/26/2015 FINAL  Final    Coagulation Studies: No results for input(s): LABPROT, INR in the last 72 hours.  Urinalysis:  Recent Labs Lab 03/25/15 1157  COLORURINE YELLOW  LABSPEC 1.023  PHURINE 5.0  GLUCOSEU NEGATIVE  HGBUR  NEGATIVE  BILIRUBINUR NEGATIVE  KETONESUR NEGATIVE  PROTEINUR 30*  UROBILINOGEN 0.2  NITRITE NEGATIVE  LEUKOCYTESUR NEGATIVE    Lipid Panel:     Component Value Date/Time   CHOL 213* 07/22/2014 0928   TRIG 93 03/25/2015 0615   HDL 62.20 07/22/2014 0928   CHOLHDL 3 07/22/2014 0928   VLDL 19.8 07/22/2014 0928   LDLCALC 131* 07/22/2014 0928    HgbA1C:  Lab Results  Component Value Date   HGBA1C 6.4 12/07/2014    Urine Drug Screen:  No results found for: LABOPIA, COCAINSCRNUR, LABBENZ, AMPHETMU, THCU, LABBARB  Alcohol Level: No results for input(s): ETH in the last 168 hours.  Other results:  Imaging: Ct Head Wo Contrast  03/27/2015   CLINICAL DATA:  RIGHT-sided facial droop. Symptoms began earlier today. Altered mental status.  EXAM: CT HEAD WITHOUT CONTRAST  TECHNIQUE: Contiguous axial images were obtained from the base of the skull through the vertex without intravenous contrast.  COMPARISON:  05/27/2014.  FINDINGS: Possible acute 4 mm lacune, LEFT lentiform nucleus as seen on image 12 series 5. This was not clearly present on in 2015 scan.  No other areas of concern for cortical or subcortical infarction.  No hemorrhage, mass lesion, hydrocephalus, or extra-axial fluid.  Cerebral and cerebellar atrophy. Generalized white matter hypoattenuation, likely small vessel disease. Than the calvarium intact. Vascular calcification. No sinus or mastoid disease. Dense lenticular opacities.  IMPRESSION: Possible acute 4 mm lacunar infarct LEFT lentiform nucleus. If further investigation desired, and no contraindications, consider MRI brain.   Electronically Signed   By: Davonna Belling M.D.   On: 03/27/2015 16:25   Dg Chest Port 1 View  03/27/2015   CLINICAL DATA:  Left PICC placement. Nausea and vomiting. Initial encounter.  EXAM: PORTABLE CHEST - 1 VIEW  COMPARISON:  Chest radiograph performed 03/26/2015  FINDINGS: The patient's left PICC is noted ending about the proximal SVC. An enteric tube  is noted extending below the diaphragm.  The lungs are hypoexpanded. Bibasilar and right upper lung zone airspace opacities may reflect atelectasis or pneumonia. No pleural effusion or pneumothorax is seen  The cardiomediastinal silhouette is borderline normal in size. No acute osseous abnormalities are identified.  IMPRESSION: 1. Left PICC noted ending about the proximal SVC. 2. Lungs hypoexpanded. Bibasilar and right upper lung zone airspace opacities may reflect atelectasis or pneumonia.  These results were called by telephone at the time of interpretation on 03/27/2015 at 7:33 pm to Nursing in the [facility], who verbally acknowledged these results.   Electronically Signed   By: Roanna Raider M.D.   On: 03/27/2015 19:34   Dg Chest Port 1 View  03/26/2015   CLINICAL DATA:  Atelectasis  EXAM: PORTABLE CHEST - 1 VIEW  COMPARISON:  Yesterday  FINDINGS: NG tube and left PICC are stable. Upper normal heart size. Low volumes. Bibasilar atelectasis is not significantly changed. No pneumothorax.  IMPRESSION: Stable bibasilar atelectasis.   Electronically Signed   By: Jolaine Click M.D.   On: 03/26/2015 08:14    Assessment: 81 y.o. female hx of HTN, CAD, DM, HLD admitted with SBO, s/p surgery with hospital course complicated by sepsis. Noted to  have developed right sided weakness on 5/15 with CT head showing possible ischemic infarct in left lentiform nucleus. Suspect small vessel disease based on risk factors and location of infarct. Per RN her strength has markedly improved.  Patient remains lethargic which I do not feel is explained by small ischemic infarct on CT. Suspect encephalopathy is likely multifactorial and related to underlying infectious and metabolic abnormalities.   Due to multiple other co-morbidities will complete stroke workup at [facility] instead of transferring to [facility].   Stroke Risk Factors - HTN, HLD, DM  Plan: 1. HgbA1c, fasting lipid panel 2. MRI, MRA  of the brain  without contrast 3. PT consult, OT consult, Speech consult 4. Echocardiogram 5. Carotid dopplers 6. Prophylactic therapy-ASA 325mg  7. Risk factor modification 8. Telemetry monitoring 9. Frequent neuro checks 10. NPO until RN stroke swallow screen   *  Elspeth Cho, DO [facility] [phone]  If 7pm- 7am, please page neurology on call as listed in AMION. 03/27/2015, 8:39 PM

## 2015-03-27 NOTE — Progress Notes (Signed)
Neurologist came to bedside at 2330 to assess patient. RN updated MD on the patients current status and progress noted throughout the shift. Will continue to monitor. Bosie Helper, RN

## 2015-03-27 NOTE — Progress Notes (Signed)
Patient ID: [name], female   DOB: 1933/09/10, 81 y.o.   MRN: [id number] [facility] PROGRESS NOTE  [name] HCW:[id number] DOB: 12/16/1932 DOA: 03/18/2015 PCP: Walker Kehr, MD  Brief narrative:    81 year old female with history of hypertension, coronary artery disease, hyperlipidemia, diabetes who presented to [facility] ED with diffuse lower abdominal pain associated with nausea, vomiting and poor po intake.   Patient was found to have SBO on this admission thought to be secondary to adhesions. She did not improve with conservative management which included IVF, analgesia and antiemetics as well as NG tube for decompression. She subsequently underwent laparoscopic surgery with lysis of adhesions and laparotomy with bowel decompression on 03/24/2015. Post-operatively, patient developed sepsis due to peritoneal contamination. Sepsis protocol initiated and pt was transferred to SDU 03/25/2015.  Her hospital course remains complicated with altered mental status. Family does not want pt to receive ativan or haldol. She remains in SDU because of sepsis management. I anticipate transfer to telemetry later on today or tomorrow if she remains stable hemodynamically.   Assessment/Plan:    Principal problem: Severe sepsis secondary to peritonitis / Leukocytosis  - Sepsis criteria met on 03/25/2015. Pt presented with tachycardia, tachypnea, hypotension, lactic acidosis. Procalcitonin was more than 175. Source of sepsis thought to be peritonitis status post surgery and peritoneal contamination.  - Pt was started on Mefoxin 03/25/2015 and it was then changed to Flagyl and rocephin 03/26/2015. - Pt remains afebrile and her WB count is stable. CBC this morning is pending.  - Blood cultures to date are negative. - Procalcitonin is trending down, from more than  175 --> 42 today  - PCCM has seen the pt in consultation. Pt is hemodynamically stable and we anticipate transfer to telemetry floor in next  24 hours.   Active Problems: Abdominal pain / Small bowel obstruction secondary to adhesions - Status post laparoscopic surgery with lysis of adhesions and laparotomy with bowel decompression on 03/24/2015. - Doing better over past 24 hours. - She is receiving TPN for nutritional support - Surgery is following, appreciate their recommendations.  - Continue NG tube per surgery recommendations  Acute blood loss anemia / Post-operative blood loss anemia - Hemoglobin down from 10.2 to 8.3 5/14. - No reports of bleeding - CBC this morning is pending   Thrombocytopenia - Mild, platelet count 132 - Possibly from sepsis or heparin subQ - No reports of bleed - Using SCD's for DVT prophylaxis   Acute renal failure - Likely secondary to sepsis. - Creatinine improving, 2.20 --> 1.56   Hyponatremia - Likely from sepsis - Sodium is improving , 130 to 132 this morning.   Right foot pain - X ray with no acute findings other than degenerative changes   Acute Delirium / Acute encephalopathy - Likely from acute illness and possibly underlying dementia  - Avoid ativan and haldol per family request   Coronary artery disease - Stable.  Type 2 diabetes mellitus without complications -Continue to hold metformin since patient is NPO - CBG's in past 24 hours: 117, 140, 154 - Continue SSI  Essential hypertension - Continue metoprolol 2.5 mg IV Q 6 hours   Moderate protein calorie malnutrition -Poor nutrition secondary to acute illness, SBO and peritonitis - Pt is NPO. She is however receiving TPN for nutrition.   DVT Prophylaxis  - SCD's bilaterally due ot thrombocytopenia     IV access:  Peripheral IV  Procedures and diagnostic studies:    Dg Abd Portable  1v 03/23/2015 1. Persistent high-grade partial small bowel obstruction. No significant change from the previous day's study. Electronically Signed By: [name] M.D. On: 03/23/2015 10:01   Dg Abd Portable 1v  03/22/2015 NG tube removed with recurrent small bowel obstruction. Electronically Signed By: [name] M.D. On: 03/22/2015 07:42   Dg Abd Portable 1v 03/21/2015 1. NG tube noted with tip in the upper portion of the stomach. Advancement suggested. 2. Interim improvement of small bowel distention. Colonic gas pattern is normal with stool throughout the colon. These results will be called to the ordering clinician or representative by the Radiologist Assistant, and communication documented in the PACS or zVision Dashboard. Electronically Signed By: [name] On: 03/21/2015 07:32   Dg Abd Portable 1v 03/20/2015 Small bowel dilation persists consistent with a persistent partial small bowel obstruction. No change from the previous day's study.   Dg Foot 2 Views Right 03/22/2015 Degenerative change without acute abnormality.   Dg Chest Port 1 View 03/25/2015 Bibasilar atelectasis. No pneumothorax. Degree of inspiration shallow. Electronically Signed By: Lowella Grip III M.D. On: 03/25/2015 09:03   Dg Abd Portable 1v 03/24/2015 Mild improvement in small bowel obstruction. NG tube in the gastric antrum. Electronically Signed By: Franchot Gallo M.D. On: 03/24/2015 08:25   Laparoscopic enterolysis anteriorly, opened with decompressive enterotomy and lysis of band producing obstruction. 03/24/2015.   Medical Consultants:  Surgery Cardiology PCCM  Other Consultants:  Physical therapy  IAnti-Infectives:   Mefoxin 03/25/2015 --> 03/26/2015 Rocephin 03/26/2015 -->  Flagyl 03/26/2015 -->   Shelby Lenz, MD  [facility] Pager [phone]  Time spent in minutes: 25 minutes  If 7PM-7AM, please contact night-coverage [email or web address] Password [facility] 03/27/2015, 10:36 AM   LOS: 9 days    HPI/Subjective: No acute overnight events. Patient reports no vomiting.   Objective: Filed Vitals:   03/27/15 0400 03/27/15 0700 03/27/15 0800 03/27/15 0900  BP:  145/48  144/48 146/66  Pulse:  96 99 96  Temp: 98.6 F (37 C)  98.5 F (36.9 C)   TempSrc: Oral  Oral   Resp:  _0 Height:      Weight: 67.7 kg (149 lb 4 oz)     SpO2:  100% 100% 100%    Intake/Output Summary (Last 24 hours) at 03/27/15 1036 Last data filed at 03/27/15 0900  Gross per 24 hour  Intake   3465 ml  Output   3130 ml  Net    335 ml    Exam:   General:  Pt is alert, no distress  Cardiovascular: tachycardic, (+) S1,S2  Respiratory: bilateral air entry, no crackles or wheezing  Abdomen: dressing in mid abd present, tender at the surgical site, NG tube in place   Extremities: No edema, pulses palpable bilaterally  Neuro: Grossly nonfocal  Data Reviewed: Basic Metabolic Panel:  Recent Labs Lab 03/23/15 1045 03/24/15 0533 03/25/15 0615 03/26/15 0510 03/27/15 0404  NA 137 136 130* 130* 132*  K 3.9 3.4* 5.0 4.3 4.1  CL 102 99* 99* 99* 99*  CO2 22 27 19* 22 22  GLUCOSE 137* 159* 154* 143* 117*  BUN 21* 22* 29* 41* 45*  CREATININE 0.87 0.85 2.20* 2.01* 1.56*  CALCIUM 8.7* 8.4* 7.7* 7.7* 8.2*  MG  --  1.4* 1.6* 2.1 2.1  PHOS  --  3.6 3.4 2.8 1.7*   Liver Function Tests:  Recent Labs Lab 03/24/15 0533  AST 17  ALT 10*  ALKPHOS 63  BILITOT 0.5  PROT 6.6  ALBUMIN 2.6*  No results for input(s): LIPASE, AMYLASE in the last 168 hours. No results for input(s): AMMONIA in the last 168 hours. CBC:  Recent Labs Lab 03/23/15 1000 03/24/15 0533 03/25/15 0615 03/26/15 0510  WBC 7.5 6.1 24.8* 24.8*  NEUTROABS 5.7 4.6 23.9*  --   HGB 11.2* 9.1* 10.2* 8.3*  HCT 35.9* 29.2* 31.7* 26.4*  MCV 94.2 92.7 91.9 91.0  PLT 309 247 182 132*   Cardiac Enzymes:  Recent Labs Lab 03/25/15 1315 03/25/15 2223 03/26/15 0510  TROPONINI 0.03 0.03 0.03   BNP: Invalid input(s): POCBNP CBG:  Recent Labs Lab 03/26/15 1152 03/26/15 1701 03/26/15 2032 03/27/15 0026 03/27/15 0743  GLUCAP 112* 128* 117* 140* 154*    Recent Results (from the past 240  hour(s))  MRSA PCR Screening     Status: None   Collection Time: 03/24/15  8:52 AM  Result Value Ref Range Status   MRSA by PCR NEGATIVE NEGATIVE Final  Culture, blood (x 2)     Status: None (Preliminary result)   Collection Time: 03/25/15  9:30 AM  Result Value Ref Range Status   Specimen Description BLOOD RIGHT HAND  Final   Special Requests BOTTLES DRAWN AEROBIC ONLY 5CC  Final   Culture   Final           BLOOD CULTURE RECEIVED NO GROWTH TO DATE CULTURE WILL BE HELD FOR 5 DAYS BEFORE ISSUING A FINAL NEGATIVE REPORT Performed at [facility]    Report Status PENDING  Incomplete  Culture, blood (x 2)     Status: None (Preliminary result)   Collection Time: 03/25/15  9:35 AM  Result Value Ref Range Status   Specimen Description BLOOD RIGHT ARM  Final   Special Requests BOTTLES DRAWN AEROBIC AND ANAEROBIC 6CC  Final   Culture   Final           BLOOD CULTURE RECEIVED NO GROWTH TO DATE CULTURE WILL BE HELD FOR 5 DAYS BEFORE ISSUING A FINAL NEGATIVE REPORT Performed at [facility]    Report Status PENDING  Incomplete  Culture, Urine     Status: None   Collection Time: 03/25/15 11:57 AM  Result Value Ref Range Status   Specimen Description URINE, CATHETERIZED  Final   Special Requests NONE  Final   Colony Count NO GROWTH Performed at [facility]   Final   Culture NO GROWTH Performed at [facility]   Final   Report Status 03/26/2015 FINAL  Final     Scheduled Meds: . antiseptic oral rinse  7 mL Mouth Rinse q12n4p  . cefTRIAXone (ROCEPHIN)  IV  2 g Intravenous Q24H  . chlorhexidine  15 mL Mouth Rinse BID  . insulin aspart  0-15 Units Subcutaneous 6 times per day  . lip balm  1 application Topical BID  . metoprolol  2.5 mg Intravenous 4 times per day  . metronidazole  500 mg Intravenous Q6H  . sodium phosphate  Dextrose 5% IVPB  15 mmol Intravenous Once   Continuous Infusions: . TPN (CLINIMIX) Adult without lytes 70 mL/hr at 03/27/15 0900    And  . fat emulsion 240 mL (03/27/15 0900)  . TPN (CLINIMIX) Adult without lytes     And  . fat emulsion    . lactated ringers 10 mL/hr at 03/27/15 0900

## 2015-03-27 NOTE — Progress Notes (Signed)
Spoke to [place] in MRI regarding STAT MRI orders for patient tonight. Meriam Sprague spoke with [name], the patients daughter, and learned that the patient has several stents in place. It was unclear what type of stent was placed, therefore Meriam Sprague stated that a record check would need to be performed prior to the MRI. Meriam Sprague stated that she left a note for the oncoming day shift MRI technician to contact [facility] ([employer]) in the morning to obtain the necessary information to determine if it will be safe to proceed with the MRI. Bosie Helper, RN

## 2015-03-27 NOTE — Progress Notes (Signed)
PARENTERAL NUTRITION CONSULT NOTE - follow up  Pharmacy Consult for TPN Indication: bowel obstruction  Allergies  Allergen Reactions  . Amlodipine Besylate     REACTION: dizzy  . Aspirin   . Atenolol     REACTION: fatigue  . Benazepril     cough  . Benicar [Olmesartan Medoxomil]     HA  . Cozaar     nausea  . Hydrochlorothiazide W-Triamterene     REACTION: dizzy  . Hydrocodone     REACTION: HA  . Hydroxyzine Pamoate   . Iodine   . Lisinopril     REACTION: tired, cough  . Penicillins Itching    tolerates cephalosporins OK  . Pravastatin     myalgias  . Prednisolone     My stomach hurts  . Tramadol Hcl     REACTION: HA    Patient Measurements: Height: 5\' 4"  (162.6 cm) Weight: 149 lb 4 oz (67.7 kg) IBW/kg (Calculated) : 54.7 Adjusted Body Weight: 57.5kg   Vital Signs: Temp: 98.5 F (36.9 C) (05/15 0800) Temp Source: Oral (05/15 0800) BP: 153/60 mmHg (05/15 0300) Pulse Rate: 104 (05/15 0100) Intake/Output from previous day: 05/14 0701 - 05/15 0700 In: 2040 [I.V.:460; IV Piggyback:450; TPN:1040] Out: 2845 [Urine:1925; Emesis/NG output:850; Drains:70] Intake/Output from this shift: Total I/O In: 30 [Other:30] Out: -   Labs:  Recent Labs  03/25/15 0615 03/26/15 0510  WBC 24.8* 24.8*  HGB 10.2* 8.3*  HCT 31.7* 26.4*  PLT 182 132*     Recent Labs  03/25/15 0615 03/25/15 1157 03/26/15 0510 03/27/15 0404  NA 130*  --  130* 132*  K 5.0  --  4.3 4.1  CL 99*  --  99* 99*  CO2 19*  --  22 22  GLUCOSE 154*  --  143* 117*  BUN 29*  --  41* 45*  CREATININE 2.20*  --  2.01* 1.56*  LABCREA  --  112.6  --   --   CALCIUM 7.7*  --  7.7* 8.2*  MG 1.6*  --  2.1 2.1  PHOS 3.4  --  2.8 1.7*  TRIG 93  --   --   --    Estimated Creatinine Clearance: 27.7 mL/min (by C-G formula based on Cr of 1.56).    Recent Labs  03/26/15 2032 03/27/15 0026 03/27/15 0743  GLUCAP 117* 140* 154*    Medical History: Past Medical History  Diagnosis Date  . CAD  (coronary artery disease)     s/p stenting of LAD 1999- cath 5-08 EF normal LAD 30-40% restenosis. D1 50% D2 80% LCX & RCA minimal plaque  . HTN (hypertension)   . Hyperlipemia   . Anemia     iron deficiency  . Depression   . DVT (deep venous thrombosis)   . Gout   . Osteoporosis   . Pancreatitis   . GERD (gastroesophageal reflux disease)   . Renal insufficiency     Cr 1.2-1.3  . Obesity   . Diabetes mellitus   . Polyarthritis     DJD/ possible PMR  . Vitamin D deficiency   . B12 deficiency   . Tinnitus   . Anxiety   . Aneurysm, thoracic aortic   . Constipation   . Urinary frequency   . Vertigo   . Chronic back pain       Insulin Requirements: 8 units on 5/14  Current Nutrition: NPO  IVF: LR @ KVO  Central access: PICC 5/11 TPN start date: 5/11  ASSESSMENT                                                                                                          HPI: 81 year old female with history of hypertension, coronary artery disease, hyperlipidemia, iron deficiency anemia, depression, osteoporosis, mild chronic kidney disease presented to the ED with ongoing nausea for 4 days and diffuse crampy lower abdominal pain since one day prior to admission. Associated poor by mouth intake and generalized weakness. Patient was found to have small bowel obstruction.  Pharmacy consulted to start TPN.  Significant events:  5/12: laparoscopic lysis of adhesions with subsequent laparotomy and decompression of bowel 5/13: new AKI 5/14: still NPO; to continue NGT until flatus/BM 5/15: 850 ml bilious output from NGT c/w ileus > NGT/NPO to continue pending BM  Today:   Glucose - CBGs mostly < 150 with mod SSI  Electrolytes - WNL except for Phos, Na low  Renal - new AKI, SCr/UOP much improved today  LFTs - WNL  TGs - wnl but increasing  Prealbumin low  NUTRITIONAL GOALS                                                                                             RD  recs: 75-85 g/day protein, 1500-1700 Kcal/day. Clinimix E 5/15 at a goal rate of 77ml/hr + 20% fat emulsion at 13ml/hr to provide: 84 g/day protein, 1672 Kcal/day.  PLAN                                                                                                                         At 1800 today:  Continue Clinimix 5/15 without lytes at 70 ml/hr.  Can probably switch back to Clinimix E soon with improving renal function, and losing Phos  Continue 20% fat emulsion at 57ml/hr.  TPN to contain standard multivitamins and trace elements.  Maintain IVF at [facility]  Continue mod SSI  Will give 15 mmol NaPhos x 1  TPN lab panels on Mondays & Thursdays.  F/u daily.  Bernadene Person, PharmD Pager: [phone] 03/27/2015, 9:44 AM

## 2015-03-27 NOTE — Progress Notes (Signed)
Question PICC tip placement.  No blood return for three days.  TPA unsuccessful.  PICC pulled back 1cm and power flushed.  Good blood return obtain.  CXR to confirm placement.

## 2015-03-27 NOTE — Progress Notes (Signed)
[name] daughter, Octavio Graves called. She states to do not give Ativan or Haldol to her mother. She requests fentanyl only be given q4h. These instructions are written on white board in pt's room in red ink. Daughter is assured that message will be passed on to oncoming nurse.

## 2015-03-27 NOTE — Progress Notes (Signed)
While performing EKG on patient, a neurological assessment was made. The patient was able to answer questions with slurred, but understandable speech. The patient was able to weakly grip with her right hand on command, an improvement from the assessment at 2200, but not equal to left hand grip. Foot movement appeared equal bilaterally. Patient was not able to maintain eyes open or eye contact. Pupils remain equal and reactive to light sluggishly. I feel there is slight improvement in the patients status at the most recent assessment. Will continue to monitor neurological status every 2 hours and PRN throughout the night. Bosie Helper, RN

## 2015-03-27 NOTE — Progress Notes (Signed)
Notified [facility]  Night coverage Benedetto Coons NP) about patient not yet seen by a Neurologist after her stroke symptoms today; Hospitalist said he will follow up with Neurology.

## 2015-03-27 NOTE — Progress Notes (Signed)
Dr. Elisabeth Pigeon paged and made aware that patient blood pressure is still elevated 170s systolic after scheduled and prn doses of antihypertensives given. No new orders given for blood pressure. Dr. Elisabeth Pigeon also informed that patient family members need to be updated on her current status and CT of head results. MD not present at bedside during status changes, family updated over telephone.

## 2015-03-27 NOTE — Progress Notes (Signed)
Upon entering the room, patient appeared to be sleeping with visual right sided facial droop. Patient opened eyes to voice, but was unable to maintain eye opening or eye contact with me during assessment. Patient moaned audibly while repositioning, but was unable to answer questions. Hand and Foot movement has not changed since 2000 assessment with only finger movement on the right hand and a weak grip on the left hand and equal, weak foot movement bilaterally. Pupils were equal bilaterally and sluggish to light. It is my judgement that the patients neurological status has not changed since the 2000 assessment. Will continue to monitor neurological status every 2 hours and PRN throughout the night. Bosie Helper, RN

## 2015-03-27 NOTE — Progress Notes (Addendum)
Pt with facial droop on the left and right arm weakness concerning for stroke. CT head obtained and results just came available with possible acute 4 mm lacunar infarct in left lentiform nucleus. Neuro consulted and will see if recommendation to transfer to [facility]. Stroke order set placed. Manson Passey [facility] [phone]

## 2015-03-27 NOTE — Progress Notes (Addendum)
I assessed patient at change of shift with previous shift RN to ensure that no nuerological status changes were noted since most recent assessment. Upon entering the room, patient appeared to be sleeping comfortably with notable right sided facial droop. Patient responded to voice commands with movement of hands and feet on command with some delay. Right hand grip was absent, however movement of the fingers was noted. Left hand grip was present and weak. Right foot and left foot movement were present, appearing equal bilaterally. Patient opened eyes to command, but was unable to maintain open eyes for greater than 30 seconds and eye contact was not maintained at all. Patient had no verbal response. Pupils were equal bilaterally and sluggish to light. I have concluded the patient had a current RASS of -2. Family was at bedside with many questions and concerns and documented all care that was provided on patient. I answered all questions and explained everything as I assessed the patient from head to toe.  Will continue to monitor nuerological status every 2 hours and more frequently if necessary. Bosie Helper, RN

## 2015-03-28 ENCOUNTER — Ambulatory Visit (HOSPITAL_COMMUNITY): Payer: Commercial Managed Care - HMO

## 2015-03-28 ENCOUNTER — Inpatient Hospital Stay (HOSPITAL_COMMUNITY): Payer: Commercial Managed Care - HMO

## 2015-03-28 DIAGNOSIS — I6789 Other cerebrovascular disease: Secondary | ICD-10-CM

## 2015-03-28 DIAGNOSIS — R29810 Facial weakness: Secondary | ICD-10-CM | POA: Diagnosis present

## 2015-03-28 LAB — COMPREHENSIVE METABOLIC PANEL
ALBUMIN: 1.9 g/dL — AB (ref 3.5–5.0)
ALT: 18 U/L (ref 14–54)
ANION GAP: 11 (ref 5–15)
AST: 38 U/L (ref 15–41)
Alkaline Phosphatase: 86 U/L (ref 38–126)
BUN: 49 mg/dL — ABNORMAL HIGH (ref 6–20)
CO2: 23 mmol/L (ref 22–32)
Calcium: 8.1 mg/dL — ABNORMAL LOW (ref 8.9–10.3)
Chloride: 100 mmol/L — ABNORMAL LOW (ref 101–111)
Creatinine, Ser: 1.28 mg/dL — ABNORMAL HIGH (ref 0.44–1.00)
GFR calc Af Amer: 45 mL/min — ABNORMAL LOW (ref >60)
GFR, EST NON AFRICAN AMERICAN: 39 mL/min — AB (ref >60)
Glucose, Bld: 153 mg/dL — ABNORMAL HIGH (ref 65–99)
POTASSIUM: 2.8 mmol/L — AB (ref 3.5–5.1)
Sodium: 134 mmol/L — ABNORMAL LOW (ref 135–145)
Total Bilirubin: 0.2 mg/dL — ABNORMAL LOW (ref 0.3–1.2)
Total Protein: 5.6 g/dL — ABNORMAL LOW (ref 6.5–8.1)

## 2015-03-28 LAB — PHOSPHORUS: Phosphorus: 3.1 mg/dL (ref 2.5–4.6)

## 2015-03-28 LAB — CBC
HEMATOCRIT: 24.6 % — AB (ref 36.0–46.0)
Hemoglobin: 8.4 g/dL — ABNORMAL LOW (ref 12.0–15.0)
MCH: 29.6 pg (ref 26.0–34.0)
MCHC: 34.1 g/dL (ref 30.0–36.0)
MCV: 86.6 fL (ref 78.0–100.0)
PLATELETS: 131 10*3/uL — AB (ref 150–400)
RBC: 2.84 MIL/uL — AB (ref 3.87–5.11)
RDW: 12.8 % (ref 11.5–15.5)
WBC: 15.4 10*3/uL — ABNORMAL HIGH (ref 4.0–10.5)

## 2015-03-28 LAB — TYPE AND SCREEN
ABO/RH(D): B POS
ANTIBODY SCREEN: POSITIVE
DAT, IgG: NEGATIVE
DONOR AG TYPE: NEGATIVE
Donor AG Type: NEGATIVE
PT AG Type: NEGATIVE
Unit division: 0
Unit division: 0

## 2015-03-28 LAB — GLUCOSE, CAPILLARY
GLUCOSE-CAPILLARY: 147 mg/dL — AB (ref 65–99)
Glucose-Capillary: 140 mg/dL — ABNORMAL HIGH (ref 65–99)
Glucose-Capillary: 129 mg/dL — ABNORMAL HIGH (ref 65–99)
Glucose-Capillary: 116 mg/dL — ABNORMAL HIGH (ref 65–99)

## 2015-03-28 LAB — LIPID PANEL
CHOLESTEROL: 99 mg/dL (ref 0–200)
HDL: <10 mg/dL — ABNORMAL LOW (ref >40)
Triglycerides: 91 mg/dL (ref <150)
VLDL: 18 mg/dL (ref 0–40)

## 2015-03-28 LAB — DIFFERENTIAL
BASOS PCT: 0 % (ref 0–1)
Basophils Absolute: 0 10*3/uL (ref 0.0–0.1)
Eosinophils Absolute: 0.2 10*3/uL (ref 0.0–0.7)
Eosinophils Relative: 1 % (ref 0–5)
LYMPHS ABS: 0.6 10*3/uL — AB (ref 0.7–4.0)
Lymphocytes Relative: 4 % — ABNORMAL LOW (ref 12–46)
MONO ABS: 0.3 10*3/uL (ref 0.1–1.0)
Monocytes Relative: 2 % — ABNORMAL LOW (ref 3–12)
NEUTROS ABS: 14.3 10*3/uL — AB (ref 1.7–7.7)
Neutrophils Relative %: 93 % — ABNORMAL HIGH (ref 43–77)

## 2015-03-28 LAB — PROTIME-INR
INR: 1.06 (ref 0.00–1.49)
PROTHROMBIN TIME: 13.9 s (ref 11.6–15.2)

## 2015-03-28 LAB — APTT: APTT: 40 s — AB (ref 24–37)

## 2015-03-28 LAB — MAGNESIUM: Magnesium: 1.7 mg/dL (ref 1.7–2.4)

## 2015-03-28 LAB — PREALBUMIN: PREALBUMIN: 2.7 mg/dL — AB (ref 18–38)

## 2015-03-28 LAB — TRIGLYCERIDES: Triglycerides: 93 mg/dL (ref <150)

## 2015-03-28 MED ORDER — FAT EMULSION 20 % IV EMUL
240.0000 mL | INTRAVENOUS | Status: AC
  Administered 2015-03-28 (×2): 240 mL via INTRAVENOUS
  Filled 2015-03-28: qty 250

## 2015-03-28 MED ORDER — MAGNESIUM SULFATE IN D5W 10-5 MG/ML-% IV SOLN
1.0000 g | Freq: Once | INTRAVENOUS | Status: AC
  Administered 2015-03-28: 1 g via INTRAVENOUS
  Filled 2015-03-28: qty 100

## 2015-03-28 MED ORDER — POTASSIUM CHLORIDE 10 MEQ/100ML IV SOLN
10.0000 meq | INTRAVENOUS | Status: AC
  Administered 2015-03-28 (×6): 10 meq via INTRAVENOUS
  Filled 2015-03-28 (×6): qty 100

## 2015-03-28 MED ORDER — TRACE MINERALS CR-CU-F-FE-I-MN-MO-SE-ZN IV SOLN
INTRAVENOUS | Status: AC
  Administered 2015-03-28: 18:00:00 via INTRAVENOUS
  Filled 2015-03-28: qty 1680

## 2015-03-28 NOTE — Progress Notes (Signed)
Patient awake with eyes open upon entering the room and she appeared to follow me with her eyes. Patient was able to follow commands and movement of the right hand and arm was more pronounced. Patients words remain slurred, but understandable. No change in the patients pupils from last assessment. Patient denied pain. Patient appears to be improving. Will continue to monitor. Bosie Helper, RN

## 2015-03-28 NOTE — Progress Notes (Signed)
Less right sided facial droop noticed when I entered room, patients mouth was closed (this was a new finding, her mouth has been opened on all other assessments). Patient was able to stick her tongue out, but could not move it from side to side. Patient following commands with both arms and legs weakly, but equal bilaterally and still delayed. Patient moved right arm purposefully to scratch her chin without command. Patient is able to communicate verbally, words are slurred, but understandable. Patient denies pain. Patient remains lethargic and does not open eyes for any length of time or look directly at me. Her pupils remain equal and reactive, but sluggish, no change in eyes from earlier evaluations. Will continue to monitor neurological status every 2 hours and PRN throughout the night. Bosie Helper, RN

## 2015-03-28 NOTE — Progress Notes (Signed)
PARENTERAL NUTRITION CONSULT NOTE - follow up  Pharmacy Consult for TPN Indication: bowel obstruction  Allergies  Allergen Reactions  . Amlodipine Besylate     REACTION: dizzy  . Aspirin   . Atenolol     REACTION: fatigue  . Benazepril     cough  . Benicar [Olmesartan Medoxomil]     HA  . Cozaar     nausea  . Hydrochlorothiazide W-Triamterene     REACTION: dizzy  . Hydrocodone     REACTION: HA  . Hydroxyzine Pamoate   . Iodine   . Lisinopril     REACTION: tired, cough  . Penicillins Itching    tolerates cephalosporins OK  . Pravastatin     myalgias  . Prednisolone     My stomach hurts  . Tramadol Hcl     REACTION: HA    Patient Measurements: Height: 5\' 4"  (162.6 cm) Weight: 149 lb 4 oz (67.7 kg) IBW/kg (Calculated) : 54.7 Adjusted Body Weight: 57.5kg   Vital Signs: Temp: 98.5 F (36.9 C) (05/16 0800) Temp Source: Oral (05/16 0800) BP: 151/63 mmHg (05/16 0700) Pulse Rate: 86 (05/16 0700) Intake/Output from previous day: 05/15 0701 - 05/16 0700 In: 3008.7 [I.V.:295; NG/GT:30; IV Piggyback:705; TPN:1918.7] Out: 3265 [Urine:2050; Emesis/NG output:1200; Drains:15] Intake/Output from this shift:    Labs:  Recent Labs  03/26/15 0510 03/27/15 2348 03/28/15 0357  WBC 24.8*  --  15.4*  HGB 8.3*  --  8.4*  HCT 26.4*  --  24.6*  PLT 132*  --  131*  APTT  --  40*  --   INR  --  1.06  --      Recent Labs  03/25/15 1157 03/26/15 0510 03/27/15 0404 03/28/15 0357  NA  --  130* 132* 134*  K  --  4.3 4.1 2.8*  CL  --  99* 99* 100*  CO2  --  22 22 23   GLUCOSE  --  143* 117* 153*  BUN  --  41* 45* 49*  CREATININE  --  2.01* 1.56* 1.28*  LABCREA 112.6  --   --   --   CALCIUM  --  7.7* 8.2* 8.1*  MG  --  2.1 2.1 1.7  PHOS  --  2.8 1.7* 3.1  PROT  --   --   --  5.6*  ALBUMIN  --   --   --  1.9*  AST  --   --   --  38  ALT  --   --   --  18  ALKPHOS  --   --   --  86  BILITOT  --   --   --  0.2*  PREALBUMIN  --   --   --  2.7*  TRIG  --   --   --   91  93  CHOLHDL  --   --   --  PENDING  CHOL  --   --   --  99   Estimated Creatinine Clearance: 33.7 mL/min (by C-G formula based on Cr of 1.28).    Recent Labs  03/28/15 0031 03/28/15 0408 03/28/15 0724  GLUCAP 140* 147* 129*    Medical History: Past Medical History  Diagnosis Date  . CAD (coronary artery disease)     s/p stenting of LAD 1999- cath 5-08 EF normal LAD 30-40% restenosis. D1 50% D2 80% LCX & RCA minimal plaque  . HTN (hypertension)   . Hyperlipemia   . Anemia  iron deficiency  . Depression   . DVT (deep venous thrombosis)   . Gout   . Osteoporosis   . Pancreatitis   . GERD (gastroesophageal reflux disease)   . Renal insufficiency     Cr 1.2-1.3  . Obesity   . Diabetes mellitus   . Polyarthritis     DJD/ possible PMR  . Vitamin D deficiency   . B12 deficiency   . Tinnitus   . Anxiety   . Aneurysm, thoracic aortic   . Constipation   . Urinary frequency   . Vertigo   . Chronic back pain       Insulin Requirements: 13 units Novolog over 24 hours  Current Nutrition: NPO  IVF: LR @ KVO  Central access: PICC 5/11 TPN start date: 5/11  ASSESSMENT                                                                                                          HPI: 81 year old female with history of hypertension, coronary artery disease, hyperlipidemia, iron deficiency anemia, depression, osteoporosis, mild chronic kidney disease presented to the ED with ongoing nausea for 4 days and diffuse crampy lower abdominal pain since one day prior to admission. Associated poor by mouth intake and generalized weakness. Patient was found to have small bowel obstruction.  Pharmacy consulted to start TPN.  Significant events:  5/12: laparoscopic lysis of adhesions with subsequent laparotomy and decompression of bowel 5/13: new AKI 5/14: still NPO; to continue NGT until flatus/BM 5/15: 850 ml bilious output from NGT c/w ileus > NGT/NPO to continue pending BM;  :suspected lacunar infarct per head CT  Today:   Glucose - CBGs mostly < 150 with mod SSI  Electrolytes - Na 134, K 2.8, Mag 1.7, CoCa 9.8, phos 3.1 (s/p repletion on 5/15)  Renal - new AKI, SCr trending down 1.28 (crcl~34)  LFTs - WNL  TGs - wnl   Prealbumin low  NUTRITIONAL GOALS                                                                                             RD recs: 75-85 g/day protein, 1500-1700 Kcal/day. Clinimix E 5/15 at a goal rate of 64ml/hr + 20% fat emulsion at 59ml/hr to provide: 84 g/day protein, 1672 Kcal/day.  PLAN  1) Magnesium sulfate 1gm IV x1 and 10 mEq IV x 6 runs today 2) At 1800 today:  Change to Clinimix E 5/15 at 70 ml/hr.  Continue 20% fat emulsion at 72ml/hr.  TPN to contain standard multivitamins and trace elements.  Maintain [facility]F at [facility]  Continue mod SSI q4h  TPN lab panels on Mondays & Thursdays.  F/u daily.  Dorna Leitz, PharmD, BCPS 03/28/2015 8:31 AM

## 2015-03-28 NOTE — Evaluation (Signed)
SLP Cancellation Note  Patient Details Name: [name] MRN: [id number] DOB: 11/11/1933   Cancelled treatment:       Reason Eval/Treat Not Completed: Other (comment) (pt at MRI per secretary at this time, note bowel depression procedure and pt on TPN, will reattempt for SPEECH/LANGUAGE evaluation as ordered, thanks)   Donavan Burnet, MS [facility] SLP [phone]

## 2015-03-28 NOTE — Progress Notes (Signed)
Subjective: Patient in bed with eyes closed.  Responds appropriately to questions being asked.  Reports she is too tired to move.  Family member at bedside.  Objective: Current vital signs: BP 148/65 mmHg  Pulse 102  Temp(Src) 98.3 F (36.8 C) (Oral)  Resp 18  Ht 5\' 4"  (1.626 m)  Wt 67.7 kg (149 lb 4 oz)  BMI 25.61 kg/m2  SpO2 100% Vital signs in last 24 hours: Temp:  [98 F (36.7 C)-99.1 F (37.3 C)] 98.3 F (36.8 C) (05/16 1200) Pulse Rate:  [84-107] 102 (05/16 1800) Resp:  [14-28] 18 (05/16 1800) BP: (125-196)/(54-84) 148/65 mmHg (05/16 1800) SpO2:  [99 %-100 %] 100 % (05/16 1800)  Intake/Output from previous day: 05/15 0701 - 05/16 0700 In: 3008.7 [I.V.:295; NG/GT:30; IV Piggyback:705; TPN:1918.7] Out: 3265 [Urine:2050; Emesis/NG output:1200; Drains:15] Intake/Output this shift: Total I/O In: 1253.3 [Other:50; IV Piggyback:950; TPN:253.3] Out: 750 [Urine:750] Nutritional status: Diet NPO time specified TPN (CLINIMIX-E) Adult  Neurologic Exam: Mental Status: Lethargic.  Appropriate.  Speech fluent without evidence of aphasia.  Able to follow 3 step commands without difficulty. Cranial Nerves: II: Discs flat bilaterally; Visual fields grossly normal, pupils equal, round, reactive to light and accommodation III,IV, VI: ptosis not present, extra-ocular motions intact bilaterally V,VII: edentulous but smile symmetric, facial light touch sensation normal bilaterally VIII: hearing normal bilaterally IX,X: gag reflex present XI: bilateral shoulder shrug XII: midline tongue extension Motor: Gives weak hand grip bilaterally.  When arms lifted by examiner, patient can maintain.  Reports she has leg pain but does move legs but will not lift against gravity.  Wiggles toes bilaterally.     Lab Results: Basic Metabolic Panel:  Recent Labs Lab 03/24/15 0533 03/25/15 0615 03/26/15 0510 03/27/15 0404 03/28/15 0357  NA 136 130* 130* 132* 134*  K 3.4* 5.0 4.3 4.1 2.8*   CL 99* 99* 99* 99* 100*  CO2 27 19* 22 22 23   GLUCOSE 159* 154* 143* 117* 153*  BUN 22* 29* 41* 45* 49*  CREATININE 0.85 2.20* 2.01* 1.56* 1.28*  CALCIUM 8.4* 7.7* 7.7* 8.2* 8.1*  MG 1.4* 1.6* 2.1 2.1 1.7  PHOS 3.6 3.4 2.8 1.7* 3.1    Liver Function Tests:  Recent Labs Lab 03/24/15 0533 03/28/15 0357  AST 17 38  ALT 10* 18  ALKPHOS 63 86  BILITOT 0.5 0.2*  PROT 6.6 5.6*  ALBUMIN 2.6* 1.9*   No results for input(s): LIPASE, AMYLASE in the last 168 hours. No results for input(s): AMMONIA in the last 168 hours.  CBC:  Recent Labs Lab 03/23/15 1000 03/24/15 0533 03/25/15 0615 03/26/15 0510 03/28/15 0357  WBC 7.5 6.1 24.8* 24.8* 15.4*  NEUTROABS 5.7 4.6 23.9*  --  14.3*  HGB 11.2* 9.1* 10.2* 8.3* 8.4*  HCT 35.9* 29.2* 31.7* 26.4* 24.6*  MCV 94.2 92.7 91.9 91.0 86.6  PLT 309 247 182 132* 131*    Cardiac Enzymes:  Recent Labs Lab 03/25/15 1315 03/25/15 2223 03/26/15 0510  TROPONINI 0.03 0.03 0.03    Lipid Panel:  Recent Labs Lab 03/24/15 0533 03/25/15 0615 03/28/15 0357  CHOL  --   --  99  TRIG 62 93 91  93  HDL  --   --  <10*  CHOLHDL  --   --  NOT CALCULATED  VLDL  --   --  18  LDLCALC  --   --  NOT CALCULATED    CBG:  Recent Labs Lab 03/27/15 2058 03/28/15 0031 03/28/15 0408 03/28/15 0724 03/28/15 1603  GLUCAP 130* 140* 147* 129* 116*    Microbiology: Results for orders placed or performed during the hospital encounter of 03/18/15  MRSA PCR Screening     Status: None   Collection Time: 03/24/15  8:52 AM  Result Value Ref Range Status   MRSA by PCR NEGATIVE NEGATIVE Final    Comment:        The GeneXpert MRSA Assay (FDA approved for NASAL specimens only), is one component of a comprehensive MRSA colonization surveillance program. It is not intended to diagnose MRSA infection nor to guide or monitor treatment for MRSA infections.   Culture, blood (x 2)     Status: None (Preliminary result)   Collection Time: 03/25/15  9:30  AM  Result Value Ref Range Status   Specimen Description BLOOD RIGHT HAND  Final   Special Requests BOTTLES DRAWN AEROBIC ONLY 5CC  Final   Culture   Final           BLOOD CULTURE RECEIVED NO GROWTH TO DATE CULTURE WILL BE HELD FOR 5 DAYS BEFORE ISSUING A FINAL NEGATIVE REPORT Performed at [facility]    Report Status PENDING  Incomplete  Culture, blood (x 2)     Status: None (Preliminary result)   Collection Time: 03/25/15  9:35 AM  Result Value Ref Range Status   Specimen Description BLOOD RIGHT ARM  Final   Special Requests BOTTLES DRAWN AEROBIC AND ANAEROBIC 6CC  Final   Culture   Final           BLOOD CULTURE RECEIVED NO GROWTH TO DATE CULTURE WILL BE HELD FOR 5 DAYS BEFORE ISSUING A FINAL NEGATIVE REPORT Performed at [facility]    Report Status PENDING  Incomplete  Culture, Urine     Status: None   Collection Time: 03/25/15 11:57 AM  Result Value Ref Range Status   Specimen Description URINE, CATHETERIZED  Final   Special Requests NONE  Final   Colony Count NO GROWTH Performed at [facility]   Final   Culture NO GROWTH Performed at [facility]   Final   Report Status 03/26/2015 FINAL  Final    Coagulation Studies:  Recent Labs  03/27/15 2348  LABPROT 13.9  INR 1.06    Imaging: Ct Head Wo Contrast  03/27/2015   CLINICAL DATA:  RIGHT-sided facial droop. Symptoms began earlier today. Altered mental status.  EXAM: CT HEAD WITHOUT CONTRAST  TECHNIQUE: Contiguous axial images were obtained from the base of the skull through the vertex without intravenous contrast.  COMPARISON:  05/27/2014.  FINDINGS: Possible acute 4 mm lacune, LEFT lentiform nucleus as seen on image 12 series 5. This was not clearly present on in 2015 scan.  No other areas of concern for cortical or subcortical infarction.  No hemorrhage, mass lesion, hydrocephalus, or extra-axial fluid.  Cerebral and cerebellar atrophy. Generalized white matter hypoattenuation, likely  small vessel disease. Than the calvarium intact. Vascular calcification. No sinus or mastoid disease. Dense lenticular opacities.  IMPRESSION: Possible acute 4 mm lacunar infarct LEFT lentiform nucleus. If further investigation desired, and no contraindications, consider MRI brain.   Electronically Signed   By: Davonna Belling M.D.   On: 03/27/2015 16:25   Mr Maxine Glenn Head Wo Contrast  03/28/2015   CLINICAL DATA:  81 year old with right-sided facial droop. Altered mental status. Subsequent encounter.  EXAM: MRI HEAD WITHOUT CONTRAST  MRA HEAD WITHOUT CONTRAST  TECHNIQUE: Multiplanar, multiecho pulse sequences of the brain and surrounding structures were obtained  without intravenous contrast. Angiographic images of the head were obtained using MRA technique without contrast.  COMPARISON:  03/27/2015 head CT.  FINDINGS: MRI HEAD FINDINGS  No acute infarct.  No intracranial hemorrhage.  Remote tiny infarct right cerebellum. Minimal small vessel disease type changes.  Incidentally noted is a prominent peri vascular space left lenticular nucleus.  Global atrophy without hydrocephalus.  No intracranial mass lesion noted on this unenhanced exam.  Cervical medullary junction unremarkable. Mild spinal stenosis C3-4.  Expanded partially empty sella without other findings of pseudotumor cerebri  Pineal region unremarkable.  No intracranial mass lesion noted on this unenhanced exam.  MRA HEAD FINDINGS  Mild narrowing pre cavernous/ cavernous segment right internal carotid artery.  Mild ectasia left internal carotid artery cavernous segment. The minimal bulge along the left internal carotid artery cavernous segment most likely related to slight ectasia of the left ophthalmic artery origin rather than small aneurysm. Mild motion degradation limits evaluation.  Slightly ectatic anterior communicating artery without aneurysm.  Middle cerebral artery mild branch vessel irregularity bilaterally.  No significant stenosis distal vertebral  arteries or basilar artery. Regions of mild irregularity and slight narrowing.  IMPRESSION: MRI HEAD  No acute infarct.  Remote tiny infarct right cerebellum.  Incidentally noted is a prominent peri vascular space left lenticular nucleus.  Global atrophy without hydrocephalus.  Expanded partially empty sella without other findings of pseudotumor cerebri  MRA HEAD FINDINGS  No medium or large size vessel significant stenosis or occlusion. Evaluation of branch vessels slightly limited by motion degradation.   Electronically Signed   By: Lacy Duverney M.D.   On: 03/28/2015 12:02   Mr Brain Wo Contrast  03/28/2015   CLINICAL DATA:  81 year old with right-sided facial droop. Altered mental status. Subsequent encounter.  EXAM: MRI HEAD WITHOUT CONTRAST  MRA HEAD WITHOUT CONTRAST  TECHNIQUE: Multiplanar, multiecho pulse sequences of the brain and surrounding structures were obtained without intravenous contrast. Angiographic images of the head were obtained using MRA technique without contrast.  COMPARISON:  03/27/2015 head CT.  FINDINGS: MRI HEAD FINDINGS  No acute infarct.  No intracranial hemorrhage.  Remote tiny infarct right cerebellum. Minimal small vessel disease type changes.  Incidentally noted is a prominent peri vascular space left lenticular nucleus.  Global atrophy without hydrocephalus.  No intracranial mass lesion noted on this unenhanced exam.  Cervical medullary junction unremarkable. Mild spinal stenosis C3-4.  Expanded partially empty sella without other findings of pseudotumor cerebri  Pineal region unremarkable.  No intracranial mass lesion noted on this unenhanced exam.  MRA HEAD FINDINGS  Mild narrowing pre cavernous/ cavernous segment right internal carotid artery.  Mild ectasia left internal carotid artery cavernous segment. The minimal bulge along the left internal carotid artery cavernous segment most likely related to slight ectasia of the left ophthalmic artery origin rather than small  aneurysm. Mild motion degradation limits evaluation.  Slightly ectatic anterior communicating artery without aneurysm.  Middle cerebral artery mild branch vessel irregularity bilaterally.  No significant stenosis distal vertebral arteries or basilar artery. Regions of mild irregularity and slight narrowing.  IMPRESSION: MRI HEAD  No acute infarct.  Remote tiny infarct right cerebellum.  Incidentally noted is a prominent peri vascular space left lenticular nucleus.  Global atrophy without hydrocephalus.  Expanded partially empty sella without other findings of pseudotumor cerebri  MRA HEAD FINDINGS  No medium or large size vessel significant stenosis or occlusion. Evaluation of branch vessels slightly limited by motion degradation.   Electronically Signed   By: Viviann Spare  Constance Goltz M.D.   On: 03/28/2015 12:02   Dg Chest Port 1 View  03/27/2015   CLINICAL DATA:  Left PICC placement. Nausea and vomiting. Initial encounter.  EXAM: PORTABLE CHEST - 1 VIEW  COMPARISON:  Chest radiograph performed 03/26/2015  FINDINGS: The patient's left PICC is noted ending about the proximal SVC. An enteric tube is noted extending below the diaphragm.  The lungs are hypoexpanded. Bibasilar and right upper lung zone airspace opacities may reflect atelectasis or pneumonia. No pleural effusion or pneumothorax is seen  The cardiomediastinal silhouette is borderline normal in size. No acute osseous abnormalities are identified.  IMPRESSION: 1. Left PICC noted ending about the proximal SVC. 2. Lungs hypoexpanded. Bibasilar and right upper lung zone airspace opacities may reflect atelectasis or pneumonia.  These results were called by telephone at the time of interpretation on 03/27/2015 at 7:33 pm to Nursing in the [facility], who verbally acknowledged these results.   Electronically Signed   By: Roanna Raider M.D.   On: 03/27/2015 19:34    Medications:  I have reviewed the patient's current medications. Scheduled: . antiseptic  oral rinse  7 mL Mouth Rinse q12n4p  . aspirin  300 mg Rectal Daily   Or  . aspirin  325 mg Oral Daily  . cefTRIAXone (ROCEPHIN)  IV  2 g Intravenous Q24H  . chlorhexidine  15 mL Mouth Rinse BID  . insulin aspart  0-15 Units Subcutaneous 6 times per day  . lip balm  1 application Topical BID  . metoprolol  2.5 mg Intravenous 4 times per day  . metronidazole  500 mg Intravenous Q6H    Assessment/Plan: No focality noted on examination.  MRI of the brain personally reviewed and shows no acute changes.  MRA shows no hemodynamically significant stenosis.  Echocardiogram shows no intracardiac masses or thrombi.  VLDL 18, cholesterol 99.  A1c pending.  Patient on ASA.  Unclear if patient actually had a vascular event.  She cooperates intermittently and reports that she is tired.  She also dose not have her dentures.    Recommendations: 1.  No further stroke work up recommended at this time. 2.  May continue ASA.  Patient with multiple vascular risk factors.   3.  PT/OT    LOS: 10 days   Thana Farr, MD [facility] [phone] 03/28/2015  6:44 PM

## 2015-03-28 NOTE — Progress Notes (Signed)
OT Cancellation Note  Patient Details Name: [name] MRN: [id number] DOB: [birth year]   Cancelled Treatment:    Reason Eval/Treat Not Completed: Medical issues which prohibited therapy;Other (comment) (Medical issues which prohibited therapy (W/U for stroke))  Marili Vader, [place] Kung 03/28/2015, 12:26 PM

## 2015-03-28 NOTE — Progress Notes (Signed)
Patient ID: KACY HEGNA, female   DOB: [birth year], 81 y.o.   MRN: [id number] [facility] PROGRESS NOTE  MANDIE CRABBE [name]:[id number] DOB: 01-25-1933 DOA: 03/18/2015 PCP: Walker Kehr, MD  Brief narrative:    81 year old female with history of hypertension, coronary artery disease, hyperlipidemia, diabetes who presented to [facility] ED with diffuse lower abdominal pain associated with nausea, vomiting and poor po intake.   Patient was found to have SBO on this admission thought to be secondary to adhesions. She did not improve with conservative management which included IVF, analgesia and antiemetics as well as NG tube for decompression. She subsequently underwent laparoscopic surgery with lysis of adhesions and laparotomy with bowel decompression on 03/24/2015. Post-operatively, patient developed sepsis due to peritoneal contamination. Sepsis protocol initiated and pt was transferred to SDU 03/25/2015.  Her hospital course remains complicated with ongoing altered mental status. Further, she has developed left side facial droop and right arm weakness 03/27/2015. CT head at that time revealed possible acute 4 mm lacunar infarct in left lentiform nucleus. MRI at this time is pending. Neurology has seen the patient in consultation. Per neurology, because of recent surgery patient is not a candidate for tPA.   Assessment/Plan:    Principal problem: Severe sepsis secondary to peritonitis / Leukocytosis  - Sepsis criteria met on 03/25/2015. Pt had lactic acidosis, procalcitonin was more than 175. Source of sepsis thought to be peritonitis status post surgery and peritoneal contamination.  - Pt was started on Mefoxin 03/25/2015 but this was then changed to Flagyl and rocephin 03/26/2015 per surgery. - Patient remains afebrile over past 48 hours. White blood cell count is trending down, 15.4 today. - Blood cultures and urine culture to date are negative. - Procalcitonin is trending down: more than  175  --> 42  - PCCM has seen the pt in consultation.   Active Problems: Left sided facial droop / right arm weakness / acute CVA in left lentiform nucleus - Patient developed left-sided facial droop and right arm weakness 03/27/2015 around 3:20 PM. CT head at that time showed acute 4 mm lacunar infarct in left  lentiform nucleus. Stroke order set placed.  - Aspirin daily - MRI brain / MRA brain - pending - 2D ECHO - pending  - Carotid doppler - pending  - HgbA1c - pending - Lipid panel - ?not calculated, otherwise HDL less than 10 and the rest of the panel WNL - VTE prophylaxis - SCD's bilaterally - Diet:  NPO, TPN - Therapy: PT/OT - once pt able to participate  Stroke Risk Factors  - Advanced age - Recent surgery, prolonged hospitalization   Abdominal pain / Small bowel obstruction secondary to adhesions - Status post laparoscopic surgery with lysis of adhesions and laparotomy with bowel decompression on 03/24/2015. - Continue NG tube for decompression. Continue TPN for nutritional support per surgery recommendations.  - Appreciate surgery following.  Acute blood loss anemia / Post-operative blood loss anemia - Hemoglobin down from 10.2 to 8.3 on 03/26/15. Stable since then, 8.4. - No current indications for transfusion.   Thrombocytopenia - Mild, platelet count 132, 131  - Possibly from sepsis or heparin subQ - Using SCDs for DVT prophylaxis.  - Continue to monitor daily CBC   Acute renal failure - Likely secondary to sepsis. - Creatinine improving, 2.20 --> 1.56 --> 1.28  Hyponatremia - Likely from sepsis - Sodium is improving , 130 to 132 to 134  this morning   Right foot pain - X ray with  no acute findings other than degenerative changes   Acute Delirium / Acute encephalopathy - Likely from acute illness and possibly underlying dementia  - Family request the patient does not get Ativan or Haldol.   Coronary artery disease - Stable.  Type 2 diabetes mellitus without  complications -Continue to hold metformin since patient is NPO - CBGs in past 24 hours: 140, 147, 129 - Continue sliding scale insulin.   Essential hypertension - Continue metoprolol 2.5 mg IV Q 6 hours   Moderate protein calorie malnutrition -Poor nutrition secondary to acute illness, SBO and peritonitis - continue TPN for nutritional support.    DVT Prophylaxis  - SCD's bilaterally due ot thrombocytopenia     IV access:  PICC line   Procedures and diagnostic studies:    Dg Abd Portable 1v 03/23/2015 1. Persistent high-grade partial small bowel obstruction. No significant change from the previous day's study. Electronically Signed By: Lajean Manes M.D. On: 03/23/2015 10:01   Dg Abd Portable 1v 03/22/2015 NG tube removed with recurrent small bowel obstruction. Electronically Signed By: Genevie Ann M.D. On: 03/22/2015 07:42   Dg Abd Portable 1v 03/21/2015 1. NG tube noted with tip in the upper portion of the stomach. Advancement suggested. 2. Interim improvement of small bowel distention. Colonic gas pattern is normal with stool throughout the colon. These results will be called to the ordering clinician or representative by the Radiologist Assistant, and communication documented in the PACS or zVision Dashboard. Electronically Signed By: Marcello Moores Register On: 03/21/2015 07:32   Dg Abd Portable 1v 03/20/2015 Small bowel dilation persists consistent with a persistent partial small bowel obstruction. No change from the previous day's study.   Dg Foot 2 Views Right 03/22/2015 Degenerative change without acute abnormality.   Dg Chest Port 1 View 03/25/2015 Bibasilar atelectasis. No pneumothorax. Degree of inspiration shallow. Electronically Signed By: Lowella Grip III M.D. On: 03/25/2015 09:03   Dg Abd Portable 1v 03/24/2015 Mild improvement in small bowel obstruction. NG tube in the gastric antrum. Electronically Signed By: Franchot Gallo M.D. On:  03/24/2015 08:25   Laparoscopic enterolysis anteriorly, opened with decompressive enterotomy and lysis of band producing obstruction. 03/24/2015.  Ct Head Wo Contrast 03/27/2015   Possible acute 4 mm lacunar infarct LEFT lentiform nucleus. If further investigation desired, and no contraindications, consider MRI brain.   Electronically Signed   By: Rolla Flatten M.D.   On: 03/27/2015 16:25   Dg Chest Port 1 View 03/27/2015   1. Left PICC noted ending about the proximal SVC. 2. Lungs hypoexpanded. Bibasilar and right upper lung zone airspace opacities may reflect atelectasis or pneumonia.   Medical Consultants:  Surgery Cardiology PCCM Neurology  Other Consultants:  Physical therapy  IAnti-Infectives:   Mefoxin 03/25/2015 --> 03/26/2015 Rocephin 03/26/2015 -->  Flagyl 03/26/2015 -->     Leisa Lenz, MD  [facility] Pager [phone]  Time spent in minutes: 25 minutes  If 7PM-7AM, please contact night-coverage [email or web address] Password [email or web address] 03/28/2015, 11:13 AM   LOS: 10 days    HPI/Subjective: No acute overnight events. Yesterday afternoon, patient presented with left-sided facial droop and right arm weakness.  Objective: Filed Vitals:   03/28/15 0700 03/28/15 0800 03/28/15 0900 03/28/15 1000  BP: 151/63 196/68 190/69 143/54  Pulse: 86 90 94 103  Temp:  98.5 F (36.9 C)    TempSrc:  Oral    Resp: 18 22 21 28   Height:      Weight:      SpO2: 100% 100% 100% 100%  Intake/Output Summary (Last 24 hours) at 03/28/15 1113 Last data filed at 03/28/15 1010  Gross per 24 hour  Intake 3103.66 ml  Output   2825 ml  Net 278.66 ml    Exam:   General:  Pt is lethargic, no distress  Cardiovascular: tachycardic, S1,S2 appreciated   Respiratory: No wheezing, no crackles  Abdomen: dressing in place over surgery site, NG tube in place   Extremities: No leg swelling, palpable pulses  Neuro: Still has left-sided facial droop, unable to assess arm strength because  patient lethargic.  Data Reviewed: Basic Metabolic Panel:  Recent Labs Lab 03/24/15 0533 03/25/15 0615 03/26/15 0510 03/27/15 0404 03/28/15 0357  NA 136 130* 130* 132* 134*  K 3.4* 5.0 4.3 4.1 2.8*  CL 99* 99* 99* 99* 100*  CO2 27 19* 22 22 23   GLUCOSE 159* 154* 143* 117* 153*  BUN 22* 29* 41* 45* 49*  CREATININE 0.85 2.20* 2.01* 1.56* 1.28*  CALCIUM 8.4* 7.7* 7.7* 8.2* 8.1*  MG 1.4* 1.6* 2.1 2.1 1.7  PHOS 3.6 3.4 2.8 1.7* 3.1   Liver Function Tests:  Recent Labs Lab 03/24/15 0533 03/28/15 0357  AST 17 38  ALT 10* 18  ALKPHOS 63 86  BILITOT 0.5 0.2*  PROT 6.6 5.6*  ALBUMIN 2.6* 1.9*   No results for input(s): LIPASE, AMYLASE in the last 168 hours. No results for input(s): AMMONIA in the last 168 hours. CBC:  Recent Labs Lab 03/23/15 1000 03/24/15 0533 03/25/15 0615 03/26/15 0510 03/28/15 0357  WBC 7.5 6.1 24.8* 24.8* 15.4*  NEUTROABS 5.7 4.6 23.9*  --  14.3*  HGB 11.2* 9.1* 10.2* 8.3* 8.4*  HCT 35.9* 29.2* 31.7* 26.4* 24.6*  MCV 94.2 92.7 91.9 91.0 86.6  PLT 309 247 182 132* 131*   Cardiac Enzymes:  Recent Labs Lab 03/25/15 1315 03/25/15 2223 03/26/15 0510  TROPONINI 0.03 0.03 0.03   BNP: Invalid input(s): POCBNP CBG:  Recent Labs Lab 03/27/15 1625 03/27/15 2058 03/28/15 0031 03/28/15 0408 03/28/15 0724  GLUCAP 146* 130* 140* 147* 129*    Recent Results (from the past 240 hour(s))  MRSA PCR Screening     Status: None   Collection Time: 03/24/15  8:52 AM  Result Value Ref Range Status   MRSA by PCR NEGATIVE NEGATIVE Final  Culture, blood (x 2)     Status: None (Preliminary result)   Collection Time: 03/25/15  9:30 AM  Result Value Ref Range Status   Specimen Description BLOOD RIGHT HAND  Final   Special Requests BOTTLES DRAWN AEROBIC ONLY 5CC  Final   Culture   Final           BLOOD CULTURE RECEIVED NO GROWTH TO DATE CULTURE WILL BE HELD FOR 5 DAYS BEFORE ISSUING A FINAL NEGATIVE REPORT Performed at [facility]     Report Status PENDING  Incomplete  Culture, blood (x 2)     Status: None (Preliminary result)   Collection Time: 03/25/15  9:35 AM  Result Value Ref Range Status   Specimen Description BLOOD RIGHT ARM  Final   Special Requests BOTTLES DRAWN AEROBIC AND ANAEROBIC 6CC  Final   Culture   Final           BLOOD CULTURE RECEIVED NO GROWTH TO DATE CULTURE WILL BE HELD FOR 5 DAYS BEFORE ISSUING A FINAL NEGATIVE REPORT Performed at [facility]    Report Status PENDING  Incomplete  Culture, Urine     Status: None   Collection Time: 03/25/15  11:57 AM  Result Value Ref Range Status   Specimen Description URINE, CATHETERIZED  Final   Special Requests NONE  Final   Colony Count NO GROWTH Performed at [facility]   Final   Culture NO GROWTH Performed at [facility]   Final   Report Status 03/26/2015 FINAL  Final     Scheduled Meds: . antiseptic oral rinse  7 mL Mouth Rinse q12n4p  . aspirin  300 mg Rectal Daily   Or  . aspirin  325 mg Oral Daily  . cefTRIAXone (ROCEPHIN)  IV  2 g Intravenous Q24H  . chlorhexidine  15 mL Mouth Rinse BID  . insulin aspart  0-15 Units Subcutaneous 6 times per day  . lip balm  1 application Topical BID  . metoprolol  2.5 mg Intravenous 4 times per day  . metronidazole  500 mg Intravenous Q6H  . potassium chloride  10 mEq Intravenous Q1 Hr x 6   Continuous Infusions: . TPN (CLINIMIX) Adult without lytes Stopped (03/28/15 1010)   And  . fat emulsion Stopped (03/28/15 1010)  . Marland KitchenTPN (CLINIMIX-E) Adult     And  . fat emulsion    . lactated ringers Stopped (03/28/15 0800)

## 2015-03-28 NOTE — Progress Notes (Signed)
PT Cancellation Note  Patient Details Name: [name] MRN: [id number] DOB: 1933-06-06   Cancelled Treatment:    Reason Eval/Treat Not Completed: Medical issues which prohibited therapy (W/U for stroke)   Rada Hay 03/28/2015, 11:33 AM Blanchard Kelch PT [phone]

## 2015-03-28 NOTE — Progress Notes (Signed)
Patient ID: [name], female   DOB: 1933/08/20, 81 y.o.   MRN: [id number] 4 Days Post-Op  Subjective: Pt sedated, but does answer some questions, no necessarily appropriately though.  "I'm bound up.  Can you free me for at least 1 hour?"  Denies pain.  Doesn't know whether she has passed flatus or not  Objective: Vital signs in last 24 hours: Temp:  [98 F (36.7 C)-99.1 F (37.3 C)] 98.5 F (36.9 C) (05/16 0800) Pulse Rate:  [79-107] 86 (05/16 0700) Resp:  [14-24] 18 (05/16 0700) BP: (146-176)/(57-84) 151/63 mmHg (05/16 0700) SpO2:  [99 %-100 %] 100 % (05/16 0700) Last BM Date: 03/23/15  Intake/Output from previous day: 05/15 0701 - 05/16 0700 In: 3008.7 [I.V.:295; NG/GT:30; IV Piggyback:705; TPN:1918.7] Out: 3265 [Urine:2050; Emesis/NG output:1200; Drains:15] Intake/Output this shift:    PE: Abd: soft, seems soft and NT, JP drain has been pulled out and dressing covers this incision.  Other incisions are c/d/i with staples.  Hypoactive BS, NGT in place with bilious output  Lab Results:   Recent Labs  03/26/15 0510 03/28/15 0357  WBC 24.8* 15.4*  HGB 8.3* 8.4*  HCT 26.4* 24.6*  PLT 132* 131*   BMET  Recent Labs  03/27/15 0404 03/28/15 0357  NA 132* 134*  K 4.1 2.8*  CL 99* 100*  CO2 22 23  GLUCOSE 117* 153*  BUN 45* 49*  CREATININE 1.56* 1.28*  CALCIUM 8.2* 8.1*   PT/INR  Recent Labs  03/27/15 2348  LABPROT 13.9  INR 1.06   CMP     Component Value Date/Time   NA 134* 03/28/2015 0357   K 2.8* 03/28/2015 0357   CL 100* 03/28/2015 0357   CO2 23 03/28/2015 0357   GLUCOSE 153* 03/28/2015 0357   BUN 49* 03/28/2015 0357   CREATININE 1.28* 03/28/2015 0357   CALCIUM 8.1* 03/28/2015 0357   PROT 5.6* 03/28/2015 0357   ALBUMIN 1.9* 03/28/2015 0357   AST 38 03/28/2015 0357   ALT 18 03/28/2015 0357   ALKPHOS 86 03/28/2015 0357   BILITOT 0.2* 03/28/2015 0357   GFRNONAA 39* 03/28/2015 0357   GFRAA 45* 03/28/2015 0357   Lipase     Component Value  Date/Time   LIPASE 30 03/18/2015 0714       Studies/Results: Ct Head Wo Contrast  03/27/2015   CLINICAL DATA:  RIGHT-sided facial droop. Symptoms began earlier today. Altered mental status.  EXAM: CT HEAD WITHOUT CONTRAST  TECHNIQUE: Contiguous axial images were obtained from the base of the skull through the vertex without intravenous contrast.  COMPARISON:  05/27/2014.  FINDINGS: Possible acute 4 mm lacune, LEFT lentiform nucleus as seen on image 12 series 5. This was not clearly present on in 2015 scan.  No other areas of concern for cortical or subcortical infarction.  No hemorrhage, mass lesion, hydrocephalus, or extra-axial fluid.  Cerebral and cerebellar atrophy. Generalized white matter hypoattenuation, likely small vessel disease. Than the calvarium intact. Vascular calcification. No sinus or mastoid disease. Dense lenticular opacities.  IMPRESSION: Possible acute 4 mm lacunar infarct LEFT lentiform nucleus. If further investigation desired, and no contraindications, consider MRI brain.   Electronically Signed   By: Davonna Belling M.D.   On: 03/27/2015 16:25   Dg Chest Port 1 View  03/27/2015   CLINICAL DATA:  Left PICC placement. Nausea and vomiting. Initial encounter.  EXAM: PORTABLE CHEST - 1 VIEW  COMPARISON:  Chest radiograph performed 03/26/2015  FINDINGS: The patient's left PICC is noted ending about the  proximal SVC. An enteric tube is noted extending below the diaphragm.  The lungs are hypoexpanded. Bibasilar and right upper lung zone airspace opacities may reflect atelectasis or pneumonia. No pleural effusion or pneumothorax is seen  The cardiomediastinal silhouette is borderline normal in size. No acute osseous abnormalities are identified.  IMPRESSION: 1. Left PICC noted ending about the proximal SVC. 2. Lungs hypoexpanded. Bibasilar and right upper lung zone airspace opacities may reflect atelectasis or pneumonia.  These results were called by telephone at the time of interpretation  on 03/27/2015 at 7:33 pm to Nursing in the [facility], who verbally acknowledged these results.   Electronically Signed   By: Roanna Raider M.D.   On: 03/27/2015 19:34    Anti-infectives: Anti-infectives    Start     Dose/Rate Route Frequency Ordered Stop   03/26/15 1600  cefTRIAXone (ROCEPHIN) 2 g in dextrose 5 % 50 mL IVPB - Premix    Comments:  Pharmacy may adjust dosing strength / duration / interval for maximal efficacy   2 g 100 mL/hr over 30 Minutes Intravenous Every 24 hours 03/26/15 1007     03/26/15 1200  metroNIDAZOLE (FLAGYL) IVPB 500 mg     500 mg 100 mL/hr over 60 Minutes Intravenous Every 6 hours 03/26/15 1007     03/25/15 1600  cefOXitin (MEFOXIN) 1 g in dextrose 5 % 50 mL IVPB  Status:  Discontinued     1 g 100 mL/hr over 30 Minutes Intravenous Every 12 hours 03/25/15 0801 03/26/15 1007   03/25/15 0800  cefOXitin (MEFOXIN) 1 g in dextrose 5 % 50 mL IVPB  Status:  Discontinued     1 g 100 mL/hr over 30 Minutes Intravenous 3 times per day 03/25/15 0752 03/25/15 0758   03/24/15 1700  cefOXitin (MEFOXIN) 1 g in dextrose 5 % 50 mL IVPB     1 g 100 mL/hr over 30 Minutes Intravenous Every 6 hours 03/24/15 1511 03/25/15 0512   03/24/15 0915  cefOXitin (MEFOXIN) 2 g in dextrose 5 % 50 mL IVPB    Comments:  Pharmacy may adjust dosing strength, interval, or rate of medication as needed for optimal therapy for the patient Send with patient on call to the OR.  Anesthesia to complete antibiotic administration <73min prior to incision per [facility].   2 g 100 mL/hr over 30 Minutes Intravenous On call to O.R. 03/24/15 0910 03/24/15 1042       Assessment/Plan  POD 3, s/p lap converted to open LOA for SBO, Dr. Daphine Deutscher -cont NGT for post op ileus, await bowel function -mobilize as able s/p CVA yesterday -IV pain meds prn for pain control -cont TNA for nutritional support Abx: Rocephin D3/5 CVA -per neurology and primary   LOS: 10 days    Dahmir Epperly  E 03/28/2015, 8:41 AM Pager: [phone]

## 2015-03-28 NOTE — Progress Notes (Signed)
No significant changes from previous neurological assessment. Patient awakens when RN enters the room and is able to verbally express herself, although speech remains slurred. Right sided weakness and facial droop are still present, but improving. Patient is able to move the right arm and hand and follow all commands. Patient remains sleepy, but maintains eye contact during assessment. Bosie Helper, RN

## 2015-03-28 NOTE — Progress Notes (Signed)
Nutrition Follow-up  DOCUMENTATION CODES:  Non-severe (moderate) malnutrition in context of chronic illness  INTERVENTION:  - Continue TPN per pharmacy - Advance to CLD as soon as medically feasible - RD to continue to follow for needs and assessment  NUTRITION DIAGNOSIS:  Malnutrition related to chronic illness as evidenced by moderate depletion of body fat, severe depletion of muscle mass.  ongoing  GOAL:  Patient will meet greater than or equal to 90% of their needs  Met with TPN  MONITOR:  Diet advancement, Labs, Weight trends, Skin, I & O's   ASSESSMENT: 81-year-old female with history of hypertension, coronary artery disease, hyperlipidemia, iron deficiency anemia, depression, osteoporosis, mild chronic kidney disease presented to the ED with ongoing nausea for 4 days and diffuse crampy lower abdominal pain since one day prior to admission. Associated poor by mouth intake and generalized weakness. Patient was found to have small bowel obstruction.  Per surgery, continue NGT. 850 mL bilious output 5/15. Pt with hypoactive bowel sounds.  Plan is to continue TPN. Pt NPO.  RD will continue to monitor  Plan per pharmacy: At 1800 today:  Change to Clinimix E 5/15 at 70 ml/hr. Continue 20% fat emulsion at 10ml/hr. Provides 1672 kcal (100% of needs) and 84 g protein (100% of needs).  Height:  Ht Readings from Last 1 Encounters:  03/25/15 5' 4" (1.626 m)    Weight:  Wt Readings from Last 1 Encounters:  03/27/15 149 lb 4 oz (67.7 kg)    Ideal Body Weight:  54.5 kg  Wt Readings from Last 10 Encounters:  03/27/15 149 lb 4 oz (67.7 kg)  12/31/14 145 lb (65.772 kg)  12/07/14 147 lb 8 oz (66.906 kg)  09/23/14 150 lb (68.04 kg)  08/19/14 150 lb (68.04 kg)  07/22/14 153 lb 12 oz (69.741 kg)  06/24/14 156 lb (70.761 kg)  05/26/14 162 lb (73.483 kg)  04/12/14 164 lb (74.39 kg)  03/14/14 172 lb (78.019 kg)    BMI:  Body mass index is 25.61  kg/(m^2).  Estimated Nutritional Needs:  Kcal:  1500-1700  Protein:  75-85g  Fluid:  1.5L/day  Skin:  Wound (see comment) (surgical incision and puncture wound on abdomen)  Diet Order:  TPN (CLINIMIX) Adult without lytes Diet NPO time specified TPN (CLINIMIX-E) Adult  EDUCATION NEEDS:  No education needs identified at this time   Intake/Output Summary (Last 24 hours) at 03/28/15 1215 Last data filed at 03/28/15 1207  Gross per 24 hour  Intake 3213.66 ml  Output   2225 ml  Net 988.66 ml    Last BM:  5/11    MS, RD, LDN [phone] 

## 2015-03-28 NOTE — Progress Notes (Signed)
  Echocardiogram 2D Echocardiogram has been performed.  Keyundra Fant FRANCES 03/28/2015, 2:35 PM

## 2015-03-28 NOTE — Progress Notes (Signed)
Date:  Mar 28, 2015 U.R. performed for needs and level of care. Will continue to follow for Case Management needs. Rt sided weakness continues pod [address] RN, BSN, [place]   [phone]

## 2015-03-29 LAB — GLUCOSE, CAPILLARY
GLUCOSE-CAPILLARY: 117 mg/dL — AB (ref 65–99)
GLUCOSE-CAPILLARY: 128 mg/dL — AB (ref 65–99)
GLUCOSE-CAPILLARY: 141 mg/dL — AB (ref 65–99)
GLUCOSE-CAPILLARY: 163 mg/dL — AB (ref 65–99)
Glucose-Capillary: 101 mg/dL — ABNORMAL HIGH (ref 65–99)
Glucose-Capillary: 150 mg/dL — ABNORMAL HIGH (ref 65–99)
Glucose-Capillary: 169 mg/dL — ABNORMAL HIGH (ref 65–99)
Glucose-Capillary: 101 mg/dL — ABNORMAL HIGH (ref 65–99)

## 2015-03-29 LAB — COMPREHENSIVE METABOLIC PANEL
ALBUMIN: 1.7 g/dL — AB (ref 3.5–5.0)
ALK PHOS: 98 U/L (ref 38–126)
ALT: 18 U/L (ref 14–54)
ANION GAP: 6 (ref 5–15)
AST: 33 U/L (ref 15–41)
BILIRUBIN TOTAL: 0.4 mg/dL (ref 0.3–1.2)
BUN: 51 mg/dL — ABNORMAL HIGH (ref 6–20)
CHLORIDE: 104 mmol/L (ref 101–111)
CO2: 23 mmol/L (ref 22–32)
CREATININE: 1.08 mg/dL — AB (ref 0.44–1.00)
Calcium: 7.7 mg/dL — ABNORMAL LOW (ref 8.9–10.3)
GFR calc non Af Amer: 48 mL/min — ABNORMAL LOW (ref >60)
GFR, EST AFRICAN AMERICAN: 55 mL/min — AB (ref >60)
GLUCOSE: 149 mg/dL — AB (ref 65–99)
POTASSIUM: 3.7 mmol/L (ref 3.5–5.1)
Sodium: 133 mmol/L — ABNORMAL LOW (ref 135–145)
Total Protein: 5.6 g/dL — ABNORMAL LOW (ref 6.5–8.1)

## 2015-03-29 LAB — MAGNESIUM: MAGNESIUM: 1.9 mg/dL (ref 1.7–2.4)

## 2015-03-29 LAB — HEMOGLOBIN A1C
Hgb A1c MFr Bld: 6.3 % — ABNORMAL HIGH (ref 4.8–5.6)
Mean Plasma Glucose: 134 mg/dL

## 2015-03-29 MED ORDER — SODIUM CHLORIDE 0.9 % IV SOLN
INTRAVENOUS | Status: DC
  Administered 2015-03-29: 10 mL/h via INTRAVENOUS
  Administered 2015-04-13 – 2015-04-16 (×2): via INTRAVENOUS

## 2015-03-29 MED ORDER — TRACE MINERALS CR-CU-MN-SE-ZN 10-1000-500-60 MCG/ML IV SOLN
INTRAVENOUS | Status: AC
  Administered 2015-03-29: 18:00:00 via INTRAVENOUS
  Filled 2015-03-29: qty 1680

## 2015-03-29 MED ORDER — POTASSIUM CHLORIDE 10 MEQ/100ML IV SOLN
10.0000 meq | INTRAVENOUS | Status: AC
  Administered 2015-03-29 (×2): 10 meq via INTRAVENOUS
  Filled 2015-03-29 (×2): qty 100

## 2015-03-29 MED ORDER — MAGNESIUM SULFATE IN D5W 10-5 MG/ML-% IV SOLN
1.0000 g | Freq: Once | INTRAVENOUS | Status: AC
  Administered 2015-03-29: 1 g via INTRAVENOUS
  Filled 2015-03-29: qty 100

## 2015-03-29 MED ORDER — FAT EMULSION 20 % IV EMUL
240.0000 mL | INTRAVENOUS | Status: AC
  Administered 2015-03-29: 240 mL via INTRAVENOUS
  Filled 2015-03-29: qty 250

## 2015-03-29 NOTE — Progress Notes (Signed)
Wasted (30mL) of Fentanyl in the sink with Lajuana Matte, RN. S.Shaheer Bonfield, RN

## 2015-03-29 NOTE — Progress Notes (Signed)
Patient ID: [name], female   DOB: 1933/05/06, 81 y.o.   MRN: [id number] 5 Days Post-Op  Subjective: Pt sleepy, but c/o something in her throat.  Unable to tell me if she has passed flatus or not.  RN reports 500cc of bilious output overnight  Objective: Vital signs in last 24 hours: Temp:  [98 F (36.7 C)-100 F (37.8 C)] 98.5 F (36.9 C) (05/17 0800) Pulse Rate:  [82-105] 86 (05/17 0800) Resp:  [12-28] 17 (05/17 0800) BP: (121-190)/(50-71) 144/57 mmHg (05/17 0800) SpO2:  [100 %] 100 % (05/17 0800) Last BM Date: 03/23/15  Intake/Output from previous day: 05/16 0701 - 05/17 0700 In: 2281.8 [IV Piggyback:1150; TPN:1081.8] Out: 1700 [Urine:1450; Emesis/NG output:250] Intake/Output this shift:    PE: Abd: soft, minimal BS, incisions c/d/i with staples, NT, NGT with cannister just changed, so empty right now  Lab Results:   Recent Labs  03/28/15 0357  WBC 15.4*  HGB 8.4*  HCT 24.6*  PLT 131*   BMET  Recent Labs  03/28/15 0357 03/29/15 0528  NA 134* 133*  K 2.8* 3.7  CL 100* 104  CO2 23 23  GLUCOSE 153* 149*  BUN 49* 51*  CREATININE 1.28* 1.08*  CALCIUM 8.1* 7.7*   PT/INR  Recent Labs  03/27/15 2348  LABPROT 13.9  INR 1.06   CMP     Component Value Date/Time   NA 133* 03/29/2015 0528   K 3.7 03/29/2015 0528   CL 104 03/29/2015 0528   CO2 23 03/29/2015 0528   GLUCOSE 149* 03/29/2015 0528   BUN 51* 03/29/2015 0528   CREATININE 1.08* 03/29/2015 0528   CALCIUM 7.7* 03/29/2015 0528   PROT 5.6* 03/29/2015 0528   ALBUMIN 1.7* 03/29/2015 0528   AST 33 03/29/2015 0528   ALT 18 03/29/2015 0528   ALKPHOS 98 03/29/2015 0528   BILITOT 0.4 03/29/2015 0528   GFRNONAA 48* 03/29/2015 0528   GFRAA 55* 03/29/2015 0528   Lipase     Component Value Date/Time   LIPASE 30 03/18/2015 0714       Studies/Results: Ct Head Wo Contrast  03/27/2015   CLINICAL DATA:  RIGHT-sided facial droop. Symptoms began earlier today. Altered mental status.  EXAM: CT  HEAD WITHOUT CONTRAST  TECHNIQUE: Contiguous axial images were obtained from the base of the skull through the vertex without intravenous contrast.  COMPARISON:  05/27/2014.  FINDINGS: Possible acute 4 mm lacune, LEFT lentiform nucleus as seen on image 12 series 5. This was not clearly present on in 2015 scan.  No other areas of concern for cortical or subcortical infarction.  No hemorrhage, mass lesion, hydrocephalus, or extra-axial fluid.  Cerebral and cerebellar atrophy. Generalized white matter hypoattenuation, likely small vessel disease. Than the calvarium intact. Vascular calcification. No sinus or mastoid disease. Dense lenticular opacities.  IMPRESSION: Possible acute 4 mm lacunar infarct LEFT lentiform nucleus. If further investigation desired, and no contraindications, consider MRI brain.   Electronically Signed   By: Davonna Belling M.D.   On: 03/27/2015 16:25   Mr Maxine Glenn Head Wo Contrast  03/28/2015   CLINICAL DATA:  81 year old with right-sided facial droop. Altered mental status. Subsequent encounter.  EXAM: MRI HEAD WITHOUT CONTRAST  MRA HEAD WITHOUT CONTRAST  TECHNIQUE: Multiplanar, multiecho pulse sequences of the brain and surrounding structures were obtained without intravenous contrast. Angiographic images of the head were obtained using MRA technique without contrast.  COMPARISON:  03/27/2015 head CT.  FINDINGS: MRI HEAD FINDINGS  No acute infarct.  No intracranial  hemorrhage.  Remote tiny infarct right cerebellum. Minimal small vessel disease type changes.  Incidentally noted is a prominent peri vascular space left lenticular nucleus.  Global atrophy without hydrocephalus.  No intracranial mass lesion noted on this unenhanced exam.  Cervical medullary junction unremarkable. Mild spinal stenosis C3-4.  Expanded partially empty sella without other findings of pseudotumor cerebri  Pineal region unremarkable.  No intracranial mass lesion noted on this unenhanced exam.  MRA HEAD FINDINGS  Mild  narrowing pre cavernous/ cavernous segment right internal carotid artery.  Mild ectasia left internal carotid artery cavernous segment. The minimal bulge along the left internal carotid artery cavernous segment most likely related to slight ectasia of the left ophthalmic artery origin rather than small aneurysm. Mild motion degradation limits evaluation.  Slightly ectatic anterior communicating artery without aneurysm.  Middle cerebral artery mild branch vessel irregularity bilaterally.  No significant stenosis distal vertebral arteries or basilar artery. Regions of mild irregularity and slight narrowing.  IMPRESSION: MRI HEAD  No acute infarct.  Remote tiny infarct right cerebellum.  Incidentally noted is a prominent peri vascular space left lenticular nucleus.  Global atrophy without hydrocephalus.  Expanded partially empty sella without other findings of pseudotumor cerebri  MRA HEAD FINDINGS  No medium or large size vessel significant stenosis or occlusion. Evaluation of branch vessels slightly limited by motion degradation.   Electronically Signed   By: Lacy Duverney M.D.   On: 03/28/2015 12:02   Mr Brain Wo Contrast  03/28/2015   CLINICAL DATA:  80 year old with right-sided facial droop. Altered mental status. Subsequent encounter.  EXAM: MRI HEAD WITHOUT CONTRAST  MRA HEAD WITHOUT CONTRAST  TECHNIQUE: Multiplanar, multiecho pulse sequences of the brain and surrounding structures were obtained without intravenous contrast. Angiographic images of the head were obtained using MRA technique without contrast.  COMPARISON:  03/27/2015 head CT.  FINDINGS: MRI HEAD FINDINGS  No acute infarct.  No intracranial hemorrhage.  Remote tiny infarct right cerebellum. Minimal small vessel disease type changes.  Incidentally noted is a prominent peri vascular space left lenticular nucleus.  Global atrophy without hydrocephalus.  No intracranial mass lesion noted on this unenhanced exam.  Cervical medullary junction  unremarkable. Mild spinal stenosis C3-4.  Expanded partially empty sella without other findings of pseudotumor cerebri  Pineal region unremarkable.  No intracranial mass lesion noted on this unenhanced exam.  MRA HEAD FINDINGS  Mild narrowing pre cavernous/ cavernous segment right internal carotid artery.  Mild ectasia left internal carotid artery cavernous segment. The minimal bulge along the left internal carotid artery cavernous segment most likely related to slight ectasia of the left ophthalmic artery origin rather than small aneurysm. Mild motion degradation limits evaluation.  Slightly ectatic anterior communicating artery without aneurysm.  Middle cerebral artery mild branch vessel irregularity bilaterally.  No significant stenosis distal vertebral arteries or basilar artery. Regions of mild irregularity and slight narrowing.  IMPRESSION: MRI HEAD  No acute infarct.  Remote tiny infarct right cerebellum.  Incidentally noted is a prominent peri vascular space left lenticular nucleus.  Global atrophy without hydrocephalus.  Expanded partially empty sella without other findings of pseudotumor cerebri  MRA HEAD FINDINGS  No medium or large size vessel significant stenosis or occlusion. Evaluation of branch vessels slightly limited by motion degradation.   Electronically Signed   By: Lacy Duverney M.D.   On: 03/28/2015 12:02   Dg Chest Port 1 View  03/27/2015   CLINICAL DATA:  Left PICC placement. Nausea and vomiting. Initial encounter.  EXAM: PORTABLE  CHEST - 1 VIEW  COMPARISON:  Chest radiograph performed 03/26/2015  FINDINGS: The patient's left PICC is noted ending about the proximal SVC. An enteric tube is noted extending below the diaphragm.  The lungs are hypoexpanded. Bibasilar and right upper lung zone airspace opacities may reflect atelectasis or pneumonia. No pleural effusion or pneumothorax is seen  The cardiomediastinal silhouette is borderline normal in size. No acute osseous abnormalities are  identified.  IMPRESSION: 1. Left PICC noted ending about the proximal SVC. 2. Lungs hypoexpanded. Bibasilar and right upper lung zone airspace opacities may reflect atelectasis or pneumonia.  These results were called by telephone at the time of interpretation on 03/27/2015 at 7:33 pm to Nursing in the [facility], who verbally acknowledged these results.   Electronically Signed   By: Roanna Raider M.D.   On: 03/27/2015 19:34    Anti-infectives: Anti-infectives    Start     Dose/Rate Route Frequency Ordered Stop   03/26/15 1600  cefTRIAXone (ROCEPHIN) 2 g in dextrose 5 % 50 mL IVPB - Premix    Comments:  Pharmacy may adjust dosing strength / duration / interval for maximal efficacy   2 g 100 mL/hr over 30 Minutes Intravenous Every 24 hours 03/26/15 1007     03/26/15 1200  metroNIDAZOLE (FLAGYL) IVPB 500 mg     500 mg 100 mL/hr over 60 Minutes Intravenous Every 6 hours 03/26/15 1007     03/25/15 1600  cefOXitin (MEFOXIN) 1 g in dextrose 5 % 50 mL IVPB  Status:  Discontinued     1 g 100 mL/hr over 30 Minutes Intravenous Every 12 hours 03/25/15 0801 03/26/15 1007   03/25/15 0800  cefOXitin (MEFOXIN) 1 g in dextrose 5 % 50 mL IVPB  Status:  Discontinued     1 g 100 mL/hr over 30 Minutes Intravenous 3 times per day 03/25/15 0752 03/25/15 0758   03/24/15 1700  cefOXitin (MEFOXIN) 1 g in dextrose 5 % 50 mL IVPB     1 g 100 mL/hr over 30 Minutes Intravenous Every 6 hours 03/24/15 1511 03/25/15 0512   03/24/15 0915  cefOXitin (MEFOXIN) 2 g in dextrose 5 % 50 mL IVPB    Comments:  Pharmacy may adjust dosing strength, interval, or rate of medication as needed for optimal therapy for the patient Send with patient on call to the OR.  Anesthesia to complete antibiotic administration <64min prior to incision per [facility].   2 g 100 mL/hr over 30 Minutes Intravenous On call to O.R. 03/24/15 0910 03/24/15 1042       Assessment/Plan  POD 4, s/p lap converted to open LOA for SBO, Dr.  Daphine Deutscher -cont NGT for post op ileus, await bowel function -mobilize as able s/p CVA 03-27-15 -IV pain meds prn for pain control -cont TNA for nutritional support -Abx: Rocephin D4/5 -pulmonary toilet CVA -per neurology and primary    LOS: 11 days    Joie Reamer E 03/29/2015, 8:20 AM Pager: [phone]

## 2015-03-29 NOTE — Progress Notes (Signed)
CSW spoke with pt's daughter, Wynelle Link ( [phone] to offer assistance with d/c planning. Daughter expects pt to d/c to her home following hospital d/c. SNF declined at this time. CSW is available to assist with SNF placement if plan changes and pt / family would like to reconsider SNF .  Cori Razor LCSW [phone]

## 2015-03-29 NOTE — Progress Notes (Addendum)
Patient ID: [name], female   DOB: 05-17-1933, 81 y.o.   MRN: [id number] [facility] PROGRESS NOTE  Shelby RITCHEY Jefferson:[id number] DOB: 29-Apr-1933 DOA: 03/18/2015 PCP: Walker Kehr, MD  Brief narrative:    81 year old female with history of hypertension, coronary artery disease, hyperlipidemia, diabetes who presented to [facility] ED with diffuse lower abdominal pain associated with nausea, vomiting and poor po intake.   Patient was found to have SBO on this admission thought to be secondary to adhesions. She did not improve with conservative management which included IVF, analgesia and antiemetics as well as NG tube for decompression. She subsequently underwent laparoscopic surgery with lysis of adhesions and laparotomy with bowel decompression on 03/24/2015.  Post-operatively, patient developed sepsis due to peritoneal contamination. Sepsis protocol initiated and pt was transferred to SDU 03/25/2015.  Her hospital course remains complicated with ongoing altered mental status. Further, she has developed left side facial droop and right arm weakness 03/27/2015. CT head at that time revealed possible acute 4 mm lacunar infarct in left lentiform nucleus. Stroke workup initiated on 03/27/2015. Neurology was consulted. MRI was done and actually revealed that there is no acute infarct.  She continues to have NG tube for decompression after surgery. Her current nutrition includes TPN. She is medically stable to be transferred to telemetry floor today, order placed.   Assessment/Plan:    Principal problem: Severe sepsis secondary to peritonitis, peritoneal contamination / Leukocytosis  - Sepsis criteria met on 03/25/2015. Pt had lactic acidosis and procalcitonin was more than 175. Source of sepsis thought to be peritonitis status post surgery and peritoneal contamination.  - Pt was started on Mefoxin 03/25/2015 and this was then changed to Flagyl and rocephin 03/26/2015 per surgery. - Patient  remains afebrile over past 72 hours. T max in past 24 hours 100 F.  - White blood cell count is trending down, 15.4. - Blood cultures and urine culture to date are negative. - Procalcitonin is trending down: more than 175 --> 42  - PCCM has seen the pt in consultation. No further recommendations. Last note 03/25/2015. - Order placed for transfer to telemetry today.   Active Problems: Left sided facial droop / right arm weakness / acute CVA in left lentiform nucleus - Patient developed left-sided facial droop and right arm weakness 03/27/2015 around 3:20 PM. CT head at that time showed acute 4 mm lacunar infarct in left lentiform nucleus. Stroke order set placed.  - Neurology has seen the patient in consultation - MRI obtained but showed no acute infarction.  - As part of stroke workup order was placed for 2-D echo and carotid Doppler. 2-D echo showed normal ejection fraction and normal left ventricular diastolic function parameters. Carotid Doppler is pending. - Continue aspirin daily - A1c is 6.3. - Lipid panel - ?not calculated, otherwise HDL less than 10 and the rest of the panel WNL - VTE prophylaxis - SCD's bilaterally - Diet: NPO, TPN - Therapy: PT/OT - once pt able to participate  Stroke Risk Factors  - Advanced age - Recent surgery, prolonged hospitalization  - Based on MRI results patient does have remote tiny infarct in right cerebellum   Abdominal pain / Small bowel obstruction secondary to adhesions - Status post laparoscopic surgery with lysis of adhesions and laparotomy with bowel decompression on 03/24/2015. - Continue NG tube for decompression.  - Continue TPN for nutritional support per surgery recommendations.  - Appreciate surgery continuing to follow in their recommendations.  Acute blood loss anemia /  Post-operative blood loss anemia - Hemoglobin down from 10.2 to 8.3 on 03/26/15. Stable since then, 8.4. - No reports of bleeding. No indications for  transfusion.  Thrombocytopenia - Mild, platelet count 132, 131  -Likely secondary to sepsis versus subcutaneous heparin. We are currently using SCDs for DVT prophylaxis rather than subcutaneous heparin because of thrombocytopenia.   Acute renal failure - Likely secondary to sepsis. - Creatinine improving, 2.20 --> 1.56 --> 1.28 --> 1.08.  Hyponatremia - Likely from sepsis - Sodium is improving , 130 to 134 range.  Right foot pain - X ray with no acute findings other than degenerative changes   Acute Delirium / Acute encephalopathy - Likely from acute illness and possibly underlying dementia  - Family request the patient does not get Ativan or Haldol.   Coronary artery disease - Stable.  Type 2 diabetes mellitus without complications -Continue to hold metformin since patient is NPO - CBGs in past 24 hours: 141, 117, 169 - Continue sliding scale insulin.   Essential hypertension - Continue metoprolol 2.5 mg IV Q 6 hours   Moderate protein calorie malnutrition -Poor nutrition secondary to acute illness, SBO and peritonitis - continue TPN for nutritional support.    DVT Prophylaxis  - SCD's bilaterally due ot thrombocytopenia   Code Status: full code Family Communication: no family at the bedside Disposition Plan: transfer to telemetry flor today 5/17  IV access:  PICC  Procedures and diagnostic studies:    Dg Abd Portable 1v 03/23/2015 1. Persistent high-grade partial small bowel obstruction. No significant change from the previous day's study. Electronically Signed By: Lajean Manes M.D. On: 03/23/2015 10:01   Dg Abd Portable 1v 03/22/2015 NG tube removed with recurrent small bowel obstruction. Electronically Signed By: Genevie Ann M.D. On: 03/22/2015 07:42   Dg Abd Portable 1v 03/21/2015 1. NG tube noted with tip in the upper portion of the stomach. Advancement suggested. 2. Interim improvement of small bowel distention. Colonic gas pattern is normal  with stool throughout the colon. These results will be called to the ordering clinician or representative by the Radiologist Assistant, and communication documented in the PACS or zVision Dashboard. Electronically Signed By: Marcello Moores Register On: 03/21/2015 07:32   Dg Abd Portable 1v 03/20/2015 Small bowel dilation persists consistent with a persistent partial small bowel obstruction. No change from the previous day's study.   Dg Foot 2 Views Right 03/22/2015 Degenerative change without acute abnormality.   Dg Chest Port 1 View 03/25/2015 Bibasilar atelectasis. No pneumothorax. Degree of inspiration shallow. Electronically Signed By: Lowella Grip III M.D. On: 03/25/2015 09:03   Dg Abd Portable 1v 03/24/2015 Mild improvement in small bowel obstruction. NG tube in the gastric antrum. Electronically Signed By: Franchot Gallo M.D. On: 03/24/2015 08:25   Laparoscopic enterolysis anteriorly, opened with decompressive enterotomy and lysis of band producing obstruction. 03/24/2015.  Ct Head Wo Contrast 03/27/2015 Possible acute 4 mm lacunar infarct LEFT lentiform nucleus. If further investigation desired, and no contraindications, consider MRI brain. Electronically Signed By: Rolla Flatten M.D. On: 03/27/2015 16:25   Dg Chest Port 1 View 03/27/2015 1. Left PICC noted ending about the proximal SVC. 2. Lungs hypoexpanded. Bibasilar and right upper lung zone airspace opacities may reflect atelectasis or pneumonia.   Mr Jodene Nam Head Wo Contrast 03/28/2015  IMPRESSION: MRI HEAD  No acute infarct.  Remote tiny infarct right cerebellum.  Incidentally noted is a prominent peri vascular space left lenticular nucleus.  Global atrophy without hydrocephalus.  Expanded partially empty sella without other findings  of pseudotumor cerebri  MRA HEAD FINDINGS  No medium or large size vessel significant stenosis or occlusion. Evaluation of branch vessels slightly limited by motion degradation.    Electronically Signed   By: Genia Del M.D.   On: 03/28/2015 12:02   Mr Brain Wo Contrast 03/28/2015  MRI HEAD  No acute infarct.  Remote tiny infarct right cerebellum.  Incidentally noted is a prominent peri vascular space left lenticular nucleus.  Global atrophy without hydrocephalus.  Expanded partially empty sella without other findings of pseudotumor cerebri  MRA HEAD FINDINGS  No medium or large size vessel significant stenosis or occlusion. Evaluation of branch vessels slightly limited by motion degradation.   Electronically Signed   By: Genia Del M.D.   On: 03/28/2015 12:02   US Renal 03/25/2015    Bilateral echodense kidneys consistent chronic medical renal disease. No acute abnormality. No hydronephrosis or bladder distention.   Electronically Signed   By: Marcello Moores  Register   On: 03/25/2015 13:27   Medical Consultants:  Surgery - Dr. Excell Seltzer  Cardiology  PCCM Neurology - Dr.leslie Doy Mince   Other Consultants:  Physical therapy Swallowing evaluation  IAnti-Infectives:   Mefoxin 03/25/2015 --> 03/26/2015 Rocephin 03/26/2015 -->  Flagyl 03/26/2015 -->    Leisa Lenz, MD  [facility] Pager [phone]  Time spent in minutes: 25 minutes  If 7PM-7AM, please contact night-coverage [email or web address] Password [facility] 03/29/2015, 10:54 AM   LOS: 11 days    HPI/Subjective: No acute overnight events. Patient reports vomiting.   Objective: Filed Vitals:   03/29/15 0500 03/29/15 0600 03/29/15 0700 03/29/15 0800  BP: 140/58 148/60 139/52 144/57  Pulse: 96 100 82 86  Temp:    98.5 F (36.9 C)  TempSrc:    Oral  Resp: 13 23 12 17   Height:      Weight:      SpO2: 100% 100% 100% 100%    Intake/Output Summary (Last 24 hours) at 03/29/15 1054 Last data filed at 03/29/15 4562  Gross per 24 hour  Intake 1741.84 ml  Output   1700 ml  Net  41.84 ml    Exam:   General:  Pt is alert, not in acute distress  Cardiovascular: Regular rate and rhythm, S1/S2, no  murmurs  Respiratory: Clear to auscultation bilaterally, no wheezing, no crackles, no rhonchi  Abdomen: incision clean, dressing in place over surgery site, sl;uggish BS  Extremities: No edema, pulses palpable bilaterally  Neuro: Nonfocal  Data Reviewed: Basic Metabolic Panel:  Recent Labs Lab 03/24/15 0533 03/25/15 0615 03/26/15 0510 03/27/15 0404 03/28/15 0357 03/29/15 0528  NA 136 130* 130* 132* 134* 133*  K 3.4* 5.0 4.3 4.1 2.8* 3.7  CL 99* 99* 99* 99* 100* 104  CO2 27 19* 22 22 23 23   GLUCOSE 159* 154* 143* 117* 153* 149*  BUN 22* 29* 41* 45* 49* 51*  CREATININE 0.85 2.20* 2.01* 1.56* 1.28* 1.08*  CALCIUM 8.4* 7.7* 7.7* 8.2* 8.1* 7.7*  MG 1.4* 1.6* 2.1 2.1 1.7 1.9  PHOS 3.6 3.4 2.8 1.7* 3.1  --    Liver Function Tests:  Recent Labs Lab 03/24/15 0533 03/28/15 0357 03/29/15 0528  AST 17 38 33  ALT 10* 18 18  ALKPHOS 63 86 98  BILITOT 0.5 0.2* 0.4  PROT 6.6 5.6* 5.6*  ALBUMIN 2.6* 1.9* 1.7*   No results for input(s): LIPASE, AMYLASE in the last 168 hours. No results for input(s): AMMONIA in the last 168 hours. CBC:  Recent Labs Lab 03/23/15 1000  03/24/15 0533 03/25/15 0615 03/26/15 0510 03/28/15 0357  WBC 7.5 6.1 24.8* 24.8* 15.4*  NEUTROABS 5.7 4.6 23.9*  --  14.3*  HGB 11.2* 9.1* 10.2* 8.3* 8.4*  HCT 35.9* 29.2* 31.7* 26.4* 24.6*  MCV 94.2 92.7 91.9 91.0 86.6  PLT 309 247 182 132* 131*   Cardiac Enzymes:  Recent Labs Lab 03/25/15 1315 03/25/15 2223 03/26/15 0510  TROPONINI 0.03 0.03 0.03   BNP: Invalid input(s): POCBNP CBG:  Recent Labs Lab 03/28/15 1603 03/28/15 1939 03/29/15 0004 03/29/15 0355 03/29/15 0735  GLUCAP 116* 150* 141* 117* 169*    Recent Results (from the past 240 hour(s))  MRSA PCR Screening     Status: None   Collection Time: 03/24/15  8:52 AM  Result Value Ref Range Status   MRSA by PCR NEGATIVE NEGATIVE Final  Culture, blood (x 2)     Status: None (Preliminary result)   Collection Time: 03/25/15  9:30  AM  Result Value Ref Range Status   Specimen Description BLOOD RIGHT HAND  Final   Special Requests BOTTLES DRAWN AEROBIC ONLY 5CC  Final   Culture   Final           BLOOD CULTURE RECEIVED NO GROWTH TO DATE   Report Status PENDING  Incomplete  Culture, blood (x 2)     Status: None (Preliminary result)   Collection Time: 03/25/15  9:35 AM  Result Value Ref Range Status   Specimen Description BLOOD RIGHT ARM  Final   Special Requests BOTTLES DRAWN AEROBIC AND ANAEROBIC 6CC  Final   Culture   Final           BLOOD CULTURE RECEIVED NO GROWTH TO DATE     Report Status PENDING  Incomplete  Culture, Urine     Status: None   Collection Time: 03/25/15 11:57 AM  Result Value Ref Range Status   Specimen Description URINE, CATHETERIZED  Final   Special Requests NONE  Final   Culture NO GROWTH Performed at [facility]   Final   Report Status 03/26/2015 FINAL  Final     Scheduled Meds: . aspirin  300 mg Rectal Daily  . cefTRIAXone (ROCEPHIN)  IV  2 g Intravenous Q24H  . insulin aspart  0-15 Units Subcutaneous 6 times per day  . magnesium sulfate 1 - 4 g bolus IVPB  1 g Intravenous Once  . metoprolol  2.5 mg Intravenous 4 times per day  . metronidazole  500 mg Intravenous Q6H  . potassium chloride  10 mEq Intravenous Q1 Hr x 2   Continuous Infusions: . sodium chloride    . Marland KitchenTPN (CLINIMIX-E) Adult 70 mL/hr at 03/29/15 0400   And  . fat emulsion 240 mL (03/29/15 0400)  . Marland KitchenTPN (CLINIMIX-E) Adult     And  . fat emulsion    . lactated ringers Stopped (03/28/15 0800)

## 2015-03-29 NOTE — Progress Notes (Signed)
PARENTERAL NUTRITION CONSULT NOTE - follow up  Pharmacy Consult for TPN Indication: bowel obstruction  Allergies  Allergen Reactions  . Amlodipine Besylate     REACTION: dizzy  . Aspirin   . Atenolol     REACTION: fatigue  . Benazepril     cough  . Benicar [Olmesartan Medoxomil]     HA  . Cozaar     nausea  . Hydrochlorothiazide W-Triamterene     REACTION: dizzy  . Hydrocodone     REACTION: HA  . Hydroxyzine Pamoate   . Iodine   . Lisinopril     REACTION: tired, cough  . Penicillins Itching    tolerates cephalosporins OK  . Pravastatin     myalgias  . Prednisolone     My stomach hurts  . Tramadol Hcl     REACTION: HA    Patient Measurements: Height: 5\' 4"  (162.6 cm) Weight: 149 lb 4 oz (67.7 kg) IBW/kg (Calculated) : 54.7 Adjusted Body Weight: 57.5kg   Vital Signs: Temp: 100 F (37.8 C) (05/17 0400) Temp Source: Oral (05/17 0400) BP: 145/56 mmHg (05/17 0400) Pulse Rate: 95 (05/17 0400) Intake/Output from previous day: 05/16 0701 - 05/17 0700 In: 2181.8 [IV Piggyback:1050; TPN:1081.8] Out: 1500 [Urine:1450; Emesis/NG output:50] Intake/Output from this shift:    Labs:  Recent Labs  03/27/15 2348 03/28/15 0357  WBC  --  15.4*  HGB  --  8.4*  HCT  --  24.6*  PLT  --  131*  APTT 40*  --   INR 1.06  --      Recent Labs  03/27/15 0404 03/28/15 0357 03/29/15 0528  NA 132* 134* 133*  K 4.1 2.8* 3.7  CL 99* 100* 104  CO2 22 23 23   GLUCOSE 117* 153* 149*  BUN 45* 49* 51*  CREATININE 1.56* 1.28* 1.08*  CALCIUM 8.2* 8.1* 7.7*  MG 2.1 1.7 1.9  PHOS 1.7* 3.1  --   PROT  --  5.6* 5.6*  ALBUMIN  --  1.9* 1.7*  AST  --  38 33  ALT  --  18 18  ALKPHOS  --  86 98  BILITOT  --  0.2* 0.4  PREALBUMIN  --  2.7*  --   TRIG  --  91  93  --   CHOLHDL  --  NOT CALCULATED  --   CHOL  --  99  --    Estimated Creatinine Clearance: 39.9 mL/min (by C-G formula based on Cr of 1.08).    Recent Labs  03/29/15 0004 03/29/15 0355 03/29/15 0735   GLUCAP 141* 117* 169*    Medical History: Past Medical History  Diagnosis Date  . CAD (coronary artery disease)     s/p stenting of LAD 1999- cath 5-08 EF normal LAD 30-40% restenosis. D1 50% D2 80% LCX & RCA minimal plaque  . HTN (hypertension)   . Hyperlipemia   . Anemia     iron deficiency  . Depression   . DVT (deep venous thrombosis)   . Gout   . Osteoporosis   . Pancreatitis   . GERD (gastroesophageal reflux disease)   . Renal insufficiency     Cr 1.2-1.3  . Obesity   . Diabetes mellitus   . Polyarthritis     DJD/ possible PMR  . Vitamin D deficiency   . B12 deficiency   . Tinnitus   . Anxiety   . Aneurysm, thoracic aortic   . Constipation   . Urinary frequency   .  Vertigo   . Chronic back pain       Insulin Requirements: 4 units Novolog over 24 hours  Current Nutrition: NPO  IVF: LR @ KVO  Central access: PICC 5/11 TPN start date: 5/11  ASSESSMENT                                                                                                          HPI: 81 year old female with history of hypertension, coronary artery disease, hyperlipidemia, iron deficiency anemia, depression, osteoporosis, mild chronic kidney disease presented to the ED with ongoing nausea for 4 days and diffuse crampy lower abdominal pain since one day prior to admission. Associated poor by mouth intake and generalized weakness. Patient was found to have small bowel obstruction.  Pharmacy consulted to start TPN.  Significant events:  5/12: laparoscopic lysis of adhesions with subsequent laparotomy and decompression of bowel 5/13: new AKI 5/14: still NPO; to continue NGT until flatus/BM 5/15: 850 ml bilious output from NGT c/w ileus > NGT/NPO to continue pending BM; :suspected lacunar infarct per head CT 5/16: head MRI showed no acute infarct  Today:   Glucose (goal <150- CBGs mostly < 150 with mod SSI  Electrolytes - Na 133, K up 3.7 (s/p repletion on 5/16), Mag 1.9, CoCa  9.5, phos 3.1 (on 5/16)  Renal - new AKI, SCr trending down 1.08 (crcl~40)  LFTs - WNL  TGs - wnl   Prealbumin low  NUTRITIONAL GOALS                                                                                             RD recs: 75-85 g/day protein, 1500-1700 Kcal/day. Clinimix E 5/15 at a goal rate of 52ml/hr + 20% fat emulsion at 73ml/hr to provide: 84 g/day protein, 1672 Kcal/day.  PLAN                                                                                                                         1) Magnesium sulfate 1gm IV x1 and 10 mEq IV x 2 runs today 2) At 1800 today:  continue to Clinimix E 5/15 at 70 ml/hr.  Continue 20% fat emulsion  at 31ml/hr.  TPN to contain standard multivitamins and trace elements.  Maintain IVF at [facility]  Continue mod SSI q4h  TPN lab panels on Mondays & Thursdays.  F/u daily.  Dorna Leitz, PharmD, BCPS 03/29/2015 7:52 AM

## 2015-03-29 NOTE — Evaluation (Signed)
Speech Language Pathology Evaluation Patient Details Name: [name] MRN: [id number] DOB: 1933-03-07 Today's Date: 03/29/2015 Time: 2440-1027 SLP Time Calculation (min) (ACUTE ONLY): 25 min  Problem List:  Patient Active Problem List   Diagnosis Date Noted  . Facial droop   . Acute delirium 03/26/2015  . Malnutrition of moderate degree 03/20/2015  . Small bowel obstruction s/p exlap/LOA/decompression 03/25/2015 03/18/2015  . Elevated blood pressure 03/18/2015  . Noncompliance w/medication treatment due to intermit use of medication 12/07/2014  . Fall against object 06/24/2014  . Contusion of left hand 06/24/2014  . Contusion of left hip 06/24/2014  . Loss of weight 04/12/2014  . Malignant hypertension with renal failure and congestive heart failure 09/23/2013  . Knee pain, bilateral 07/27/2013  . Chronic fatigue disorder 01/19/2013  . Vaginitis due to Candida 09/02/2012  . Sinusitis 09/02/2012  . Anemia 09/02/2012  . Polymyalgia rheumatica 09/10/2011  . Vertigo 08/21/2011  . Fatigue 05/11/2011  . ANEMIA OF CHRONIC DISEASE 09/06/2010  . KNEE PAIN, CHRONIC 05/26/2010  . DIZZINESS 05/11/2010  . SHOULDER PAIN 04/25/2010  . FREQUENCY, URINARY 03/16/2010  . CONSTIPATION, CHRONIC 01/17/2010  . FOOT PAIN 01/17/2009  . RASH AND OTHER NONSPECIFIC SKIN ERUPTION 01/17/2009  . TINNITUS NOS 10/18/2008  . B12 deficiency 08/10/2008  . LOW BACK PAIN 06/23/2008  . CHEST WALL PAIN 06/23/2008  . DYSURIA 03/18/2008  . Abdominal pain 03/18/2008  . HYPERLIPIDEMIA 12/24/2007  . DYSPNEA 12/24/2007  . Anxiety state 12/07/2007  . GERD 12/07/2007  . CHEST PAIN 12/07/2007  . OTHER SPEC FORMS CHRONIC ISCHEMIC HEART DISEASE 12/01/2007  . Diabetes type 2, controlled 11/28/2007  . Pain in joint 11/28/2007  . GOUT 09/17/2007  . Adjustment disorder with mixed anxiety and depressed mood 09/17/2007  . Coronary atherosclerosis 09/17/2007  . Disorder resulting from impaired renal function  09/17/2007  . OSTEOPOROSIS 09/17/2007  . DVT, HX OF 09/17/2007  . PANCREATITIS, HX OF 09/17/2007   Past Medical History:  Past Medical History  Diagnosis Date  . CAD (coronary artery disease)     s/p stenting of LAD 1999- cath 5-08 EF normal LAD 30-40% restenosis. D1 50% D2 80% LCX & RCA minimal plaque  . HTN (hypertension)   . Hyperlipemia   . Anemia     iron deficiency  . Depression   . DVT (deep venous thrombosis)   . Gout   . Osteoporosis   . Pancreatitis   . GERD (gastroesophageal reflux disease)   . Renal insufficiency     Cr 1.2-1.3  . Obesity   . Diabetes mellitus   . Polyarthritis     DJD/ possible PMR  . Vitamin D deficiency   . B12 deficiency   . Tinnitus   . Anxiety   . Aneurysm, thoracic aortic   . Constipation   . Urinary frequency   . Vertigo   . Chronic back pain    Past Surgical History:  Past Surgical History  Procedure Laterality Date  . Hemorrhoid surgery    . Abdominal hysterectomy    . Cholecystectomy    . Tubal ligation    . Coronary angioplasty with stent placement  1999    LAD stent  . Cardiac catheterization  2008    L main 20%, LAD stent patent, D1 50%, D2 80% (small), RCA 20%, EF 55-60%  . Laparoscopy N/A 03/24/2015    Procedure: LAPAROSCOPY DIAGNOSTIC;  Surgeon: Luretha Murphy, MD;  Location: [facility];  Service: General;  Laterality: N/A;  . Laparoscopic lysis of adhesions  N/A 03/24/2015    Procedure: LAPAROSCOPIC LYSIS OF ADHESIONS;  Surgeon: Luretha Murphy, MD;  Location: [facility];  Service: General;  Laterality: N/A;  . Laparotomy N/A 03/24/2015    Procedure: LAPAROTOMY with decompression of bowel;  Surgeon: Luretha Murphy, MD;  Location: [facility];  Service: General;  Laterality: N/A;   HPI:  81 year old female admitted 03/18/15 due to nausea and abdominal pain. MRI  negative for acute infarct. Orders received for com-cog evaluation.   Assessment / Plan / Recommendation Clinical Impression  MMSE administered. Pt scored 19/30, indicative  of cognitive impairment. Pt noted to be lethargic and profoundly weak. Recommend reevaluation once physical status is improved. Prior to admit, pt was the primary caregiver of her husband, who is blind and has dementia.     SLP Assessment   (recommend reconsult closer to DC given level of independence and responsibility prior to admit.)    Follow Up Recommendations  24 hour supervision/assistance    Frequency and Duration   n/a     Pertinent Vitals/Pain Pain Assessment: Faces Faces Pain Scale: Hurts little more Pain Intervention(s): Patient requesting pain meds-RN notified   SLP Goals  Patient/Family Stated Goal: None stated  SLP Evaluation Prior Functioning  Cognitive/Linguistic Baseline: Information not available (deficits not anticipated, as pt was primary caregiver for spouse prior to admit) Type of Home: House  Lives With: Spouse (spouse is blind, has dementia. ) Available Help at Discharge: Family Education: 12th grade Vocation: Unemployed   Cognition  Overall Cognitive Status: No family/caregiver present to determine baseline cognitive functioning Arousal/Alertness: Lethargic Orientation Level: Oriented to person;Oriented to place;Oriented to situation;Disoriented to time Attention: Focused;Sustained Focused Attention: Appears intact Sustained Attention: Impaired Sustained Attention Impairment: Verbal basic;Functional basic Memory: Impaired Memory Impairment: Retrieval deficit;Decreased recall of new information;Decreased short term memory Awareness: Appears intact Problem Solving: Impaired Problem Solving Impairment: Verbal basic    Comprehension  Auditory Comprehension Overall Auditory Comprehension: Appears within functional limits for tasks assessed    Expression Expression Primary Mode of Expression: Verbal Verbal Expression Overall Verbal Expression: Appears within functional limits for tasks assessed   Oral / Motor Oral Motor/Sensory Function Overall  Oral Motor/Sensory Function: Appears within functional limits for tasks assessed Motor Speech Overall Motor Speech: Appears within functional limits for tasks assessed   GO   Celia B. Murvin [facility], CCC-SLP [phone]  Leigh Aurora 03/29/2015, 11:41 AM

## 2015-03-29 NOTE — Evaluation (Signed)
Physical Therapy Evaluation Patient Details Name: [name] MRN: [id number] DOB: 1933-10-09 Today's Date: 03/29/2015   History of Present Illness  81 year old female with history of hypertension, coronary artery disease, hyperlipidemia, diabetes and pt s/p lap converted to open LOA for SBO on 5/12, complicated by Left sided facial droop / right arm weakness / acute CVA in left lentiform nucleus however neurologist note states reviewed MRI and no acute changes seen  Clinical Impression  Pt admitted with above diagnosis. Pt currently with functional limitations due to the deficits listed below (see PT Problem List). Pt presents with increased weakness and fatigue today.  Pt required +2 total assist for squat pivot from recliner to bed.  Pt will benefit from skilled PT to increase their independence and safety with mobility to allow discharge to the venue listed below.   Currently recommend SNF due to current assist level however per chart and daughter, hopeful for d/c home.  Will continue to keep updating recommendations based on pt progress.      Follow Up Recommendations SNF;Supervision/Assistance - 24 hour    Equipment Recommendations  Rolling walker with 5" wheels    Recommendations for Other Services       Precautions / Restrictions Precautions Precautions: Fall Precaution Comments: NG tube Restrictions Weight Bearing Restrictions: No      Mobility  Bed Mobility Overal bed mobility: Needs Assistance;+2 for physical assistance Bed Mobility: Supine to Sit Rolling: Total assist;+2 for physical assistance   Supine to sit: Total assist;+2 for physical assistance     General bed mobility comments: HOB raised  Transfers Overall transfer level: Needs assistance Equipment used: None Transfers: Squat Pivot Transfers     Squat pivot transfers: +2 physical assistance;Total assist     General transfer comment: pt not providing assist for squat pivot from recliner back to  bed  Ambulation/Gait                Stairs            Wheelchair Mobility    Modified Rankin (Stroke Patients Only)       Balance Overall balance assessment: Needs assistance Sitting-balance support: Bilateral upper extremity supported;Feet supported Sitting balance-Leahy Scale: Poor Sitting balance - Comments: requiring some assist and UE support, possibly also due to abdominal pain/surgical site                                     Pertinent Vitals/Pain Pain Assessment: Faces Faces Pain Scale: Hurts even more Pain Location: abdomen Pain Descriptors / Indicators: Grimacing;Moaning Pain Intervention(s): RN gave pain meds during session;Repositioned    Home Living Family/patient expects to be discharged to:: Private residence Living Arrangements: Spouse/significant other Available Help at Discharge: Family (plan is to d/c to daughter's house per chart) Type of Home: House                Prior Function Level of Independence: Independent (caregiver 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)               Hand Dominance        Extremity/Trunk Assessment   Upper Extremity Assessment: Generalized weakness           Lower Extremity Assessment: Generalized weakness;RLE deficits/detail;LLE deficits/detail RLE Deficits / Details: unable to perform even 50% of AROM for LEs LLE Deficits / Details: unable to perform even 50% of AROM for LEs     Communication  Communication: No difficulties (soft spoken)  Cognition Arousal/Alertness: Awake/alert (quickly became sleepy - IV pain meds prior to mobilizing) Behavior During Therapy: WFL for tasks assessed/performed Overall Cognitive Status: No family/caregiver present to determine baseline cognitive functioning                      General Comments      Exercises Other Exercises Other Exercises: AAROM x 5 reps each, shoulder flexion      Assessment/Plan    PT Assessment Patient needs  continued PT services  PT Diagnosis Generalized weakness;Difficulty walking   PT Problem List Decreased strength;Decreased activity tolerance;Decreased mobility;Decreased knowledge of use of DME;Pain  PT Treatment Interventions DME instruction;Gait training;Functional mobility training;Patient/family education;Wheelchair mobility training;Therapeutic activities;Therapeutic exercise   PT Goals (Current goals can be found in the Care Plan section) Acute Rehab PT Goals Patient Stated Goal: none stated; agreeable to back to bed PT Goal Formulation: With patient Time For Goal Achievement: 04/12/15 Potential to Achieve Goals: Good    Frequency Min 3X/week   Barriers to discharge        Co-evaluation PT/OT/SLP Co-Evaluation/Treatment: Yes Reason for Co-Treatment: Complexity of the patient's impairments (multi-system involvement) PT goals addressed during session: Mobility/safety with mobility OT goals addressed during session: Strengthening/ROM;ADL's and self-care       End of Session Equipment Utilized During Treatment: Oxygen Activity Tolerance: Patient limited by pain;Patient limited by fatigue Patient left: in bed;with call bell/phone within reach;with family/visitor present           Time: 3154-0086 PT Time Calculation (min) (ACUTE ONLY): 15 min   Charges:   PT Evaluation $Initial PT Evaluation Tier I: 1 Procedure     PT G Codes:        Joury Allcorn,KATHrine E 03/29/2015, 3:58 PM Zenovia Jarred, PT, DPT 03/29/2015 Pager: [phone]

## 2015-03-29 NOTE — Evaluation (Signed)
Occupational Therapy Evaluation Patient Details Name: [name] MRN: [id number] DOB: [birth year] Today's Date: 03/29/2015    History of Present Illness 81 year old female with history of hypertension, coronary artery disease, hyperlipidemia, diabetes and pt s/p lap converted to open LOA for SBO on 5/12, complicated by Left sided facial droop / right arm weakness / acute CVA in left lentiform nucleus however neurologist note states reviewed MRI and no acute changes seen   Clinical Impression   Pt was admitted for the above.  She will benefit from skilled OT to increase strength and endurance for ADLs.  Pt is profoundly weak at this time. Will follow in acute with UB, and bed mobility goals initially.  As pt progresses, will upgrade to include standing and transfers.  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    Follow Up Recommendations  Home health OT;Supervision/Assistance - 24 hour (may need SNF, daughter plans home with her)    Equipment Recommendations   (possibly 3:1 commode)    Recommendations for Other Services       Precautions / Restrictions Precautions Precautions: Fall Precaution Comments: NG tube Restrictions Weight Bearing Restrictions: No      Mobility Bed Mobility Overal bed mobility: Needs Assistance;+2 for physical assistance Bed Mobility: Supine to Sit Rolling: Total assist;+2 for physical assistance   Supine to sit: Total assist;+2 for physical assistance     General bed mobility comments: HOB raised  Transfers                 General transfer comment: unable    Balance                                            ADL Overall ADL's : Needs assistance/impaired     Grooming: Moderate assistance;Sitting;Wash/dry face                   Toilet Transfer: Total assistance;+2 for physical assistance (lateral pivot chair to bed)             General ADL Comments: Assisted pt  back to bed via lateral pivot.  She needs overall total A for ADLs, +1 for UB and +2 for LB, bed level     Vision     Perception     Praxis      Pertinent Vitals/Pain Pain Assessment: Faces Faces Pain Scale: Hurts even more Pain Location: abdomen Pain Descriptors / Indicators: Grimacing;Moaning Pain Intervention(s): RN gave pain meds during session     Hand Dominance     Extremity/Trunk Assessment Upper Extremity Assessment Upper Extremity Assessment: Generalized weakness           Communication Communication Communication: No difficulties (soft spoken)   Cognition Arousal/Alertness:  (awake; eyes closed at times) Behavior During Therapy: WFL for tasks assessed/performed Overall Cognitive Status: No family/caregiver present to determine baseline cognitive functioning                     General Comments       Exercises Exercises: Other exercises Other Exercises Other Exercises: AAROM x 5 reps each, shoulder flexion   Shoulder Instructions      Home Living Family/patient expects to be discharged to:: Private residence Living Arrangements: Spouse/significant other Available Help at Discharge: Family (plan is to d/c to daughter's house per chart) Type of Home: House  Lives With: Spouse (blind and has dementia)    Prior Functioning/Environment Level of Independence: Independent (caregiver 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)             OT Diagnosis: Generalized weakness;Acute pain   OT Problem List: Decreased strength;Decreased activity tolerance;Pain   OT Treatment/Interventions: Self-care/ADL training;DME and/or AE instruction;Patient/family education;Therapeutic exercise;Energy conservation;Therapeutic activities    OT Goals(Current goals can be found in the care plan section) Acute Rehab OT Goals Patient Stated Goal: none stated; agreeable to back to bed OT Goal Formulation: With patient Time For Goal  Achievement: 04/12/15 Potential to Achieve Goals: Good ADL Goals Pt Will Perform Grooming: with set-up;sitting Additional ADL Goal #1: Pt will perform UB adls with min A, seated Additional ADL Goal #2: Pt will perform bed mobility with mod A in preparation for toilet transfers Additional ADL Goal #3: Pt/family will be independent with AAROM HEP to strengthen bil UEs  OT Frequency: Min 2X/week   Barriers to D/C:            Co-evaluation PT/OT/SLP Co-Evaluation/Treatment: Yes Reason for Co-Treatment: Complexity of the patient's impairments (multi-system involvement) PT goals addressed during session: Mobility/safety with mobility OT goals addressed during session: Strengthening/ROM;ADL's and self-care      End of Session    Activity Tolerance: Patient limited by fatigue;Patient limited by pain Patient left: in bed;with call bell/phone within reach;with family/visitor present   Time: 1330-1356 OT Time Calculation (min): 26 min Charges:  OT General Charges $OT Visit: 1 Procedure OT Evaluation $Initial OT Evaluation Tier I: 1 Procedure G-Codes:    Emmalene Kattner 04-27-2015, 3:45 PM   Marica Otter, OTR/L [phone] 2015/04/27

## 2015-03-29 NOTE — Plan of Care (Signed)
Problem: Phase II Progression Outcomes Goal: IV changed to normal saline lock Outcome: Not Progressing Receiving TNA, abx  Problem: Phase III Progression Outcomes Goal: Pain controlled on oral analgesia Outcome: Not Progressing NPO

## 2015-03-30 DIAGNOSIS — E119 Type 2 diabetes mellitus without complications: Secondary | ICD-10-CM

## 2015-03-30 DIAGNOSIS — E440 Moderate protein-calorie malnutrition: Secondary | ICD-10-CM

## 2015-03-30 DIAGNOSIS — M353 Polymyalgia rheumatica: Secondary | ICD-10-CM

## 2015-03-30 DIAGNOSIS — R29810 Facial weakness: Secondary | ICD-10-CM

## 2015-03-30 LAB — GLUCOSE, CAPILLARY
GLUCOSE-CAPILLARY: 108 mg/dL — AB (ref 65–99)
GLUCOSE-CAPILLARY: 158 mg/dL — AB (ref 65–99)
GLUCOSE-CAPILLARY: 161 mg/dL — AB (ref 65–99)
GLUCOSE-CAPILLARY: 165 mg/dL — AB (ref 65–99)
Glucose-Capillary: 130 mg/dL — ABNORMAL HIGH (ref 65–99)
Glucose-Capillary: 144 mg/dL — ABNORMAL HIGH (ref 65–99)
Glucose-Capillary: 120 mg/dL — ABNORMAL HIGH (ref 65–99)

## 2015-03-30 MED ORDER — FAT EMULSION 20 % IV EMUL
240.0000 mL | INTRAVENOUS | Status: AC
  Administered 2015-03-30: 240 mL via INTRAVENOUS
  Filled 2015-03-30: qty 250

## 2015-03-30 MED ORDER — INSULIN ASPART 100 UNIT/ML ~~LOC~~ SOLN
0.0000 [IU] | Freq: Four times a day (QID) | SUBCUTANEOUS | Status: DC
  Administered 2015-03-30 (×2): 2 [IU] via SUBCUTANEOUS
  Administered 2015-03-31 (×4): 3 [IU] via SUBCUTANEOUS
  Administered 2015-04-01: 5 [IU] via SUBCUTANEOUS
  Administered 2015-04-01: 3 [IU] via SUBCUTANEOUS
  Administered 2015-04-01: 2 [IU] via SUBCUTANEOUS
  Administered 2015-04-02 (×2): 3 [IU] via SUBCUTANEOUS
  Administered 2015-04-02 – 2015-04-03 (×2): 5 [IU] via SUBCUTANEOUS

## 2015-03-30 MED ORDER — TRACE MINERALS CR-CU-MN-SE-ZN 10-1000-500-60 MCG/ML IV SOLN
INTRAVENOUS | Status: AC
  Administered 2015-03-30: 17:00:00 via INTRAVENOUS
  Filled 2015-03-30: qty 1680

## 2015-03-30 NOTE — Plan of Care (Signed)
Problem: Phase II Progression Outcomes Goal: Discharge plan established Outcome: Completed/Met Date Met:  03/30/15 Home with daughter and home health services     

## 2015-03-30 NOTE — Progress Notes (Signed)
NUTRITION NOTE  Pt continues on TPN. NGT was clamped this AM with 700cc dark-colored drainage present in canister; from flowsheet it appears that 300cc of this was from this shift. Per rounds, if pt tolerates this well, will d/c NGT soon.  Pt with PICC placed 5/11 and receiving Clinimix E 5/15 @ 70 mL/hr with 20% lipids @ 10 mL/hr. This is providing 1673 kcal, 84 grams of protein. This meets nutritional needs (1500-1700 kcal, 75-85 grams protein).  Per pharmacy note, pt to receive magnesium sulfate: 1 gram IV x1 and 10 mEq IV x2 runs today. Continue current TPN regimen.  Will continue to follow per protocol.   Trenton Gammon, RD, LDN Inpatient Clinical Dietitian Pager # [phone] After hours/weekend pager # [phone]

## 2015-03-30 NOTE — Progress Notes (Signed)
PARENTERAL NUTRITION CONSULT NOTE - follow up  Pharmacy Consult for TPN Indication: bowel obstruction  Allergies  Allergen Reactions  . Amlodipine Besylate     REACTION: dizzy  . Aspirin   . Atenolol     REACTION: fatigue  . Benazepril     cough  . Benicar [Olmesartan Medoxomil]     HA  . Cozaar     nausea  . Hydrochlorothiazide W-Triamterene     REACTION: dizzy  . Hydrocodone     REACTION: HA  . Hydroxyzine Pamoate   . Iodine   . Lisinopril     REACTION: tired, cough  . Penicillins Itching    tolerates cephalosporins OK  . Pravastatin     myalgias  . Prednisolone     My stomach hurts  . Tramadol Hcl     REACTION: HA    Patient Measurements: Height: 5\' 4"  (162.6 cm) Weight: 149 lb 4 oz (67.7 kg) IBW/kg (Calculated) : 54.7 Adjusted Body Weight: 57.5kg   Vital Signs: Temp: 97.5 F (36.4 C) (05/18 0432) Temp Source: Axillary (05/18 0432) BP: 155/61 mmHg (05/18 0432) Pulse Rate: 94 (05/18 0432) Intake/Output from previous day: 05/17 0701 - 05/18 0700 In: 1818.8 [I.V.:120; NG/GT:80; IV Piggyback:550; TPN:958.8] Out: 2525 [Urine:1775; Emesis/NG output:750] Intake/Output from this shift: Total I/O In: 60 [Other:30; NG/GT:30] Out: -   Labs:  Recent Labs  03/27/15 2348 03/28/15 0357  WBC  --  15.4*  HGB  --  8.4*  HCT  --  24.6*  PLT  --  131*  APTT 40*  --   INR 1.06  --      Recent Labs  03/28/15 0357 03/29/15 0528  NA 134* 133*  K 2.8* 3.7  CL 100* 104  CO2 23 23  GLUCOSE 153* 149*  BUN 49* 51*  CREATININE 1.28* 1.08*  CALCIUM 8.1* 7.7*  MG 1.7 1.9  PHOS 3.1  --   PROT 5.6* 5.6*  ALBUMIN 1.9* 1.7*  AST 38 33  ALT 18 18  ALKPHOS 86 98  BILITOT 0.2* 0.4  PREALBUMIN 2.7*  --   TRIG 91  93  --   CHOLHDL NOT CALCULATED  --   CHOL 99  --    Estimated Creatinine Clearance: 39.9 mL/min (by C-G formula based on Cr of 1.08).    Recent Labs  03/29/15 2358 03/30/15 0425 03/30/15 0754  GLUCAP 108* 161* 165*    Medical  History: Past Medical History  Diagnosis Date  . CAD (coronary artery disease)     s/p stenting of LAD 1999- cath 5-08 EF normal LAD 30-40% restenosis. D1 50% D2 80% LCX & RCA minimal plaque  . HTN (hypertension)   . Hyperlipemia   . Anemia     iron deficiency  . Depression   . DVT (deep venous thrombosis)   . Gout   . Osteoporosis   . Pancreatitis   . GERD (gastroesophageal reflux disease)   . Renal insufficiency     Cr 1.2-1.3  . Obesity   . Diabetes mellitus   . Polyarthritis     DJD/ possible PMR  . Vitamin D deficiency   . B12 deficiency   . Tinnitus   . Anxiety   . Aneurysm, thoracic aortic   . Constipation   . Urinary frequency   . Vertigo   . Chronic back pain       Insulin Requirements: 14 units Novolog over 24 hours  Current Nutrition: NPO  IVF: LR @ KVO  Central  access: PICC 5/11 TPN start date: 5/11  ASSESSMENT                                                                                                          HPI: 81 year old female with history of hypertension, coronary artery disease, hyperlipidemia, iron deficiency anemia, depression, osteoporosis, mild chronic kidney disease presented to the ED with ongoing nausea for 4 days and diffuse crampy lower abdominal pain since one day prior to admission. Associated poor by mouth intake and generalized weakness. Patient was found to have small bowel obstruction.  Pharmacy consulted to start TPN.  Significant events:  5/12: laparoscopic lysis of adhesions with subsequent laparotomy and decompression of bowel 5/13: new AKI 5/14: still NPO; to continue NGT until flatus/BM 5/15: 850 ml bilious output from NGT c/w ileus > NGT/NPO to continue pending BM; :suspected lacunar infarct per head CT 5/16: head MRI showed no acute infarct  Today:   Glucose (goal <150) - CBG range 108-169  Electrolytes - Na low but stable, other lytes WNL (on 5/16)  Renal - new AKI, SCr trending down 1.08 (crcl~40)  LFTs -  WNL  TGs - wnl   Prealbumin low  NUTRITIONAL GOALS                                                                                             RD recs: 75-85 g/day protein, 1500-1700 Kcal/day. Clinimix E 5/15 at a goal rate of 67ml/hr + 20% fat emulsion at 47ml/hr to provide: 84 g/day protein, 1672 Kcal/day.  PLAN                                                                                                                         1) Magnesium sulfate 1gm IV x1 and 10 mEq IV x 2 runs today 2) At 1800 today:  Continue to Clinimix E 5/15 at goal rate 70 ml/hr.  Continue 20% fat emulsion at 63ml/hr.  TPN to contain standard multivitamins and trace elements.  Maintain IVF at [facility]  Continue mod SSI but change to q6h  TPN lab panels on Mondays & Thursdays.  F/u daily.   Hessie Knows, PharmD,  BCPS Pager [phone] 03/30/2015 10:11 AM

## 2015-03-30 NOTE — Progress Notes (Signed)
Spoke with pt's daughter [name] concerning HH needs. [facility] was selected for HHRN/NA/PT/OT/SW. DME 3 in one, and [employer].

## 2015-03-30 NOTE — Progress Notes (Signed)
Patient ID: [name], female   DOB: October 19, 1933, 81 y.o.   MRN: [id number] 6 Days Post-Op  Subjective: Pt c/o something in her throat again.  I told her this was her NGT.  She thinks she may be having some flatus, but I'm not sure how reliable this is.  Objective: Vital signs in last 24 hours: Temp:  [97.5 F (36.4 C)-98.7 F (37.1 C)] 97.5 F (36.4 C) (05/18 0432) Pulse Rate:  [80-94] 94 (05/18 0432) Resp:  [11-16] 16 (05/18 0432) BP: (126-160)/(49-100) 155/61 mmHg (05/18 0432) SpO2:  [100 %] 100 % (05/18 0432) Last BM Date: 03/23/15 (preop)  Intake/Output from previous day: 05/17 0701 - 05/18 0700 In: 1818.8 [I.V.:120; NG/GT:80; IV Piggyback:550; TPN:958.8] Out: 2525 [Urine:1775; Emesis/NG output:750] Intake/Output this shift: Total I/O In: 30 [Other:30] Out: -   PE: Abd: soft, minimal BS, ND, incisions c/d/i with staples  Lab Results:   Recent Labs  03/28/15 0357  WBC 15.4*  HGB 8.4*  HCT 24.6*  PLT 131*   BMET  Recent Labs  03/28/15 0357 03/29/15 0528  NA 134* 133*  K 2.8* 3.7  CL 100* 104  CO2 23 23  GLUCOSE 153* 149*  BUN 49* 51*  CREATININE 1.28* 1.08*  CALCIUM 8.1* 7.7*   PT/INR  Recent Labs  03/27/15 2348  LABPROT 13.9  INR 1.06   CMP     Component Value Date/Time   NA 133* 03/29/2015 0528   K 3.7 03/29/2015 0528   CL 104 03/29/2015 0528   CO2 23 03/29/2015 0528   GLUCOSE 149* 03/29/2015 0528   BUN 51* 03/29/2015 0528   CREATININE 1.08* 03/29/2015 0528   CALCIUM 7.7* 03/29/2015 0528   PROT 5.6* 03/29/2015 0528   ALBUMIN 1.7* 03/29/2015 0528   AST 33 03/29/2015 0528   ALT 18 03/29/2015 0528   ALKPHOS 98 03/29/2015 0528   BILITOT 0.4 03/29/2015 0528   GFRNONAA 48* 03/29/2015 0528   GFRAA 55* 03/29/2015 0528   Lipase     Component Value Date/Time   LIPASE 30 03/18/2015 0714       Studies/Results: Mr Maxine Glenn Head Wo Contrast  03/28/2015   CLINICAL DATA:  81 year old with right-sided facial droop. Altered mental status.  Subsequent encounter.  EXAM: MRI HEAD WITHOUT CONTRAST  MRA HEAD WITHOUT CONTRAST  TECHNIQUE: Multiplanar, multiecho pulse sequences of the brain and surrounding structures were obtained without intravenous contrast. Angiographic images of the head were obtained using MRA technique without contrast.  COMPARISON:  03/27/2015 head CT.  FINDINGS: MRI HEAD FINDINGS  No acute infarct.  No intracranial hemorrhage.  Remote tiny infarct right cerebellum. Minimal small vessel disease type changes.  Incidentally noted is a prominent peri vascular space left lenticular nucleus.  Global atrophy without hydrocephalus.  No intracranial mass lesion noted on this unenhanced exam.  Cervical medullary junction unremarkable. Mild spinal stenosis C3-4.  Expanded partially empty sella without other findings of pseudotumor cerebri  Pineal region unremarkable.  No intracranial mass lesion noted on this unenhanced exam.  MRA HEAD FINDINGS  Mild narrowing pre cavernous/ cavernous segment right internal carotid artery.  Mild ectasia left internal carotid artery cavernous segment. The minimal bulge along the left internal carotid artery cavernous segment most likely related to slight ectasia of the left ophthalmic artery origin rather than small aneurysm. Mild motion degradation limits evaluation.  Slightly ectatic anterior communicating artery without aneurysm.  Middle cerebral artery mild branch vessel irregularity bilaterally.  No significant stenosis distal vertebral arteries or basilar artery. Regions  of mild irregularity and slight narrowing.  IMPRESSION: MRI HEAD  No acute infarct.  Remote tiny infarct right cerebellum.  Incidentally noted is a prominent peri vascular space left lenticular nucleus.  Global atrophy without hydrocephalus.  Expanded partially empty sella without other findings of pseudotumor cerebri  MRA HEAD FINDINGS  No medium or large size vessel significant stenosis or occlusion. Evaluation of branch vessels slightly  limited by motion degradation.   Electronically Signed   By: Lacy Duverney M.D.   On: 03/28/2015 12:02   Mr Brain Wo Contrast  03/28/2015   CLINICAL DATA:  81 year old with right-sided facial droop. Altered mental status. Subsequent encounter.  EXAM: MRI HEAD WITHOUT CONTRAST  MRA HEAD WITHOUT CONTRAST  TECHNIQUE: Multiplanar, multiecho pulse sequences of the brain and surrounding structures were obtained without intravenous contrast. Angiographic images of the head were obtained using MRA technique without contrast.  COMPARISON:  03/27/2015 head CT.  FINDINGS: MRI HEAD FINDINGS  No acute infarct.  No intracranial hemorrhage.  Remote tiny infarct right cerebellum. Minimal small vessel disease type changes.  Incidentally noted is a prominent peri vascular space left lenticular nucleus.  Global atrophy without hydrocephalus.  No intracranial mass lesion noted on this unenhanced exam.  Cervical medullary junction unremarkable. Mild spinal stenosis C3-4.  Expanded partially empty sella without other findings of pseudotumor cerebri  Pineal region unremarkable.  No intracranial mass lesion noted on this unenhanced exam.  MRA HEAD FINDINGS  Mild narrowing pre cavernous/ cavernous segment right internal carotid artery.  Mild ectasia left internal carotid artery cavernous segment. The minimal bulge along the left internal carotid artery cavernous segment most likely related to slight ectasia of the left ophthalmic artery origin rather than small aneurysm. Mild motion degradation limits evaluation.  Slightly ectatic anterior communicating artery without aneurysm.  Middle cerebral artery mild branch vessel irregularity bilaterally.  No significant stenosis distal vertebral arteries or basilar artery. Regions of mild irregularity and slight narrowing.  IMPRESSION: MRI HEAD  No acute infarct.  Remote tiny infarct right cerebellum.  Incidentally noted is a prominent peri vascular space left lenticular nucleus.  Global atrophy  without hydrocephalus.  Expanded partially empty sella without other findings of pseudotumor cerebri  MRA HEAD FINDINGS  No medium or large size vessel significant stenosis or occlusion. Evaluation of branch vessels slightly limited by motion degradation.   Electronically Signed   By: Lacy Duverney M.D.   On: 03/28/2015 12:02    Anti-infectives: Anti-infectives    Start     Dose/Rate Route Frequency Ordered Stop   03/26/15 1600  cefTRIAXone (ROCEPHIN) 2 g in dextrose 5 % 50 mL IVPB - Premix    Comments:  Pharmacy may adjust dosing strength / duration / interval for maximal efficacy   2 g 100 mL/hr over 30 Minutes Intravenous Every 24 hours 03/26/15 1007     03/26/15 1200  metroNIDAZOLE (FLAGYL) IVPB 500 mg     500 mg 100 mL/hr over 60 Minutes Intravenous Every 6 hours 03/26/15 1007     03/25/15 1600  cefOXitin (MEFOXIN) 1 g in dextrose 5 % 50 mL IVPB  Status:  Discontinued     1 g 100 mL/hr over 30 Minutes Intravenous Every 12 hours 03/25/15 0801 03/26/15 1007   03/25/15 0800  cefOXitin (MEFOXIN) 1 g in dextrose 5 % 50 mL IVPB  Status:  Discontinued     1 g 100 mL/hr over 30 Minutes Intravenous 3 times per day 03/25/15 0752 03/25/15 0758   03/24/15 1700  cefOXitin (MEFOXIN) 1 g in dextrose 5 % 50 mL IVPB     1 g 100 mL/hr over 30 Minutes Intravenous Every 6 hours 03/24/15 1511 03/25/15 0512   03/24/15 0915  cefOXitin (MEFOXIN) 2 g in dextrose 5 % 50 mL IVPB    Comments:  Pharmacy may adjust dosing strength, interval, or rate of medication as needed for optimal therapy for the patient Send with patient on call to the OR.  Anesthesia to complete antibiotic administration <50min prior to incision per [facility].   2 g 100 mL/hr over 30 Minutes Intravenous On call to O.R. 03/24/15 0910 03/24/15 1042       Assessment/Plan   POD 5, s/p lap converted to open LOA for SBO, Dr. Daphine Deutscher -difficult to assess if ileus is improving, but will try and clamp NGT today and see how she does.  If she  tolerates this, hopefully we can remove it tomorrow -mobilize as able s/p CVA 03-27-15 -IV pain meds prn for pain control -cont TNA for nutritional support -Abx: Rocephin/Flagyl D5/5 -pulmonary toilet CVA -per neurology and primary   DVT prophylaxis SCDs, heparin held for thrombocytopenia per primary serivce  LOS: 12 days    Pallas Wahlert E 03/30/2015, 8:19 AM Pager: [phone]

## 2015-03-30 NOTE — Progress Notes (Signed)
Progress Note   [name] ZOX:[id number] DOB: 1933/08/21 DOA: 03/18/2015 PCP: [name], MD   Brief Narrative:   [name] is an 81 y.o. female with a PMH of hypertension, CAD, hyperlipidemia and diabetes who was admitted 03/18/15 with SBO secondary to adhesions. She failed conservative therapy and underwent laparoscopic lysis of adhesions/laparotomy with bowel decompression on 03/24/15. Her hospital course was complicated by sepsis secondary to peritoneal contamination with transfer to the SDU on 03/25/15. She also developed left-sided facial droop/right arm weakness 03/27/15 with CT scan showing a 4 mm lacunar infarct in the left lentiform nucleus. MRI failed to show any abnormality. Neurology is following. Barrier to discharge his ongoing need for TPN for nutritional purposes.  Assessment/Plan:   Principal Problem:   Small bowel obstruction s/p exlap/LOA/decompression 03/25/2015 complicated by severe sepsis secondary to peritonitis / abdominal pain - Status post laparoscopic lysis of adhesions/laparotomy with bowel decompression 03/24/15. - Subsequently developed acute peritonitis/sepsis. - Initially treated with Mefoxin starting 03/25/15. Antibiotics switched to Flagyl/Rocephin 03/26/15. - Transferred out of the SDU 03/29/15 given her clinical stability. - Remains nothing by mouth, on TNA for nutrition with plans to pull NG tube tomorrow if she is able to tolerate clamping today.  Active Problems:   Facial droop / right arm weakness / possible acute CVA left lentiform nucleus - On 03/27/15, CT of the head done to evaluate symptoms showed an acute 4 mm lacunar infarct in the left lentiform nucleus. - Subsequent MRI negative, although clinical findings consistent with acute CVA. MRA negative for stenosis. - 2-D echo with normal EF, no diastolic dysfunction noted. No intracardiac masses or thrombi. - Carotid Dopplers pending. - Hemoglobin A1c 6.3%. Cholesterol 99. - Evaluated  by neurology, no further stroke workup recommended. - Continue aspirin. - PT/OT.    Hypokalemia/hypomagnesemia - Monitor and replace electrolytes as needed.    Acute postoperative blood loss anemia - Initial hemoglobin 10.2, has dropped approximately 2 g postoperatively. - No current indication for transfusion.    Diabetes type 2, controlled - Currently being managed with moderate scale SSI every 4 hours. CBGs 101-163. - Hemoglobin A1c 6.3%.    Anxiety state/Adjustment disorder with mixed anxiety and depressed mood - Monitor.    Coronary atherosclerosis - Continue aspirin therapy.    Acute renal failure  - Secondary to sepsis. Creatinine steadily improving.    Right foot pain  - Radiographs negative for fracture. Degenerative changes noted.    Polymyalgia rheumatica/Chronic fatigue disorder - PT/OT.    Elevated blood pressure - Continue as needed Lopressor.    Malnutrition of moderate degree - Continue TPN for nutritional support.    Acute delirium - Avoid sedating medications. Family requests patient does not get Ativan or Haldol.    Thrombocytopenia - Mild. Likely related to recent sepsis.    DVT Prophylaxis - Continue SCDs.  Code Status: Full. Family Communication: No family at the bedside.   Disposition Plan: Home with daughter when diet advanced.   IV Access:    PICC placed 03/23/15   Procedures and diagnostic studies:   Ct Head Wo Contrast  03/27/2015   CLINICAL DATA:  RIGHT-sided facial droop. Symptoms began earlier today. Altered mental status.  EXAM: CT HEAD WITHOUT CONTRAST  TECHNIQUE: Contiguous axial images were obtained from the base of the skull through the vertex without intravenous contrast.  COMPARISON:  05/27/2014.  FINDINGS: Possible acute 4 mm lacune, LEFT lentiform nucleus as seen on image 12 series 5. This was not  clearly present on in 2015 scan.  No other areas of concern for cortical or subcortical infarction.  No hemorrhage, mass  lesion, hydrocephalus, or extra-axial fluid.  Cerebral and cerebellar atrophy. Generalized white matter hypoattenuation, likely small vessel disease. Than the calvarium intact. Vascular calcification. No sinus or mastoid disease. Dense lenticular opacities.  IMPRESSION: Possible acute 4 mm lacunar infarct LEFT lentiform nucleus. If further investigation desired, and no contraindications, consider MRI brain.   Electronically Signed   By: Davonna Belling M.D.   On: 03/27/2015 16:25   Mr Maxine Glenn Head Wo Contrast  03/28/2015   CLINICAL DATA:  81 year old with right-sided facial droop. Altered mental status. Subsequent encounter.  EXAM: MRI HEAD WITHOUT CONTRAST  MRA HEAD WITHOUT CONTRAST  TECHNIQUE: Multiplanar, multiecho pulse sequences of the brain and surrounding structures were obtained without intravenous contrast. Angiographic images of the head were obtained using MRA technique without contrast.  COMPARISON:  03/27/2015 head CT.  FINDINGS: MRI HEAD FINDINGS  No acute infarct.  No intracranial hemorrhage.  Remote tiny infarct right cerebellum. Minimal small vessel disease type changes.  Incidentally noted is a prominent peri vascular space left lenticular nucleus.  Global atrophy without hydrocephalus.  No intracranial mass lesion noted on this unenhanced exam.  Cervical medullary junction unremarkable. Mild spinal stenosis C3-4.  Expanded partially empty sella without other findings of pseudotumor cerebri  Pineal region unremarkable.  No intracranial mass lesion noted on this unenhanced exam.  MRA HEAD FINDINGS  Mild narrowing pre cavernous/ cavernous segment right internal carotid artery.  Mild ectasia left internal carotid artery cavernous segment. The minimal bulge along the left internal carotid artery cavernous segment most likely related to slight ectasia of the left ophthalmic artery origin rather than small aneurysm. Mild motion degradation limits evaluation.  Slightly ectatic anterior communicating artery  without aneurysm.  Middle cerebral artery mild branch vessel irregularity bilaterally.  No significant stenosis distal vertebral arteries or basilar artery. Regions of mild irregularity and slight narrowing.  IMPRESSION: MRI HEAD  No acute infarct.  Remote tiny infarct right cerebellum.  Incidentally noted is a prominent peri vascular space left lenticular nucleus.  Global atrophy without hydrocephalus.  Expanded partially empty sella without other findings of pseudotumor cerebri  MRA HEAD FINDINGS  No medium or large size vessel significant stenosis or occlusion. Evaluation of branch vessels slightly limited by motion degradation.   Electronically Signed   By: Lacy Duverney M.D.   On: 03/28/2015 12:02   Mr Brain Wo Contrast  03/28/2015   CLINICAL DATA:  81 year old with right-sided facial droop. Altered mental status. Subsequent encounter.  EXAM: MRI HEAD WITHOUT CONTRAST  MRA HEAD WITHOUT CONTRAST  TECHNIQUE: Multiplanar, multiecho pulse sequences of the brain and surrounding structures were obtained without intravenous contrast. Angiographic images of the head were obtained using MRA technique without contrast.  COMPARISON:  03/27/2015 head CT.  FINDINGS: MRI HEAD FINDINGS  No acute infarct.  No intracranial hemorrhage.  Remote tiny infarct right cerebellum. Minimal small vessel disease type changes.  Incidentally noted is a prominent peri vascular space left lenticular nucleus.  Global atrophy without hydrocephalus.  No intracranial mass lesion noted on this unenhanced exam.  Cervical medullary junction unremarkable. Mild spinal stenosis C3-4.  Expanded partially empty sella without other findings of pseudotumor cerebri  Pineal region unremarkable.  No intracranial mass lesion noted on this unenhanced exam.  MRA HEAD FINDINGS  Mild narrowing pre cavernous/ cavernous segment right internal carotid artery.  Mild ectasia left internal carotid artery cavernous segment. The  minimal bulge along the left internal  carotid artery cavernous segment most likely related to slight ectasia of the left ophthalmic artery origin rather than small aneurysm. Mild motion degradation limits evaluation.  Slightly ectatic anterior communicating artery without aneurysm.  Middle cerebral artery mild branch vessel irregularity bilaterally.  No significant stenosis distal vertebral arteries or basilar artery. Regions of mild irregularity and slight narrowing.  IMPRESSION: MRI HEAD  No acute infarct.  Remote tiny infarct right cerebellum.  Incidentally noted is a prominent peri vascular space left lenticular nucleus.  Global atrophy without hydrocephalus.  Expanded partially empty sella without other findings of pseudotumor cerebri  MRA HEAD FINDINGS  No medium or large size vessel significant stenosis or occlusion. Evaluation of branch vessels slightly limited by motion degradation.   Electronically Signed   By: Lacy Duverney M.D.   On: 03/28/2015 12:02   Ct Abdomen Pelvis W Contrast  03/18/2015   CLINICAL DATA:  Nausea and vomiting for 4 days, anxiety, status post hysterectomy CT pelvis 05/27/2014  EXAM: CT ABDOMEN AND PELVIS WITH CONTRAST  TECHNIQUE: Multidetector CT imaging of the abdomen and pelvis was performed using the standard protocol following bolus administration of intravenous contrast.  CONTRAST:  50mL OMNIPAQUE IOHEXOL 300 MG/ML SOLN, OMNIPAQUE IOHEXOL 300 MG/ML SOLN  COMPARISON:  None.  FINDINGS: Sagittal images of the spine shows diffuse osteopenia. Degenerative changes thoracolumbar spine. The lung bases are unremarkable.  The patient is status post cholecystectomy. Mild intrahepatic biliary ductal dilatation. There is CBD dilatation up to 1.1 cm. No focal hepatic mass. The pancreas, spleen and adrenal glands are unremarkable. Kidneys are symmetrical in size and enhancement. No hydronephrosis or hydroureter. Delayed renal images shows bilateral renal symmetrical excretion.  Atherosclerotic calcifications of abdominal  aorta and iliac arteries. Moderate stool noted in right colon and proximal transverse colon. No colonic obstruction. The descending colon and sigmoid colon are empty collapsed. Some stool noted within rectum.  There are fluid distended small bowel loops in mid upper abdomen and left lower abdomen and pelvis. Small amount of fluid/stranding noted a adjacent to small bowel loops in left lower quadrant see axial image 57. Distal small bowel is decreased caliber collapsed. Findings are consistent with small bowel obstruction.  Mild distended urinary bladder. The patient is status post hysterectomy. The terminal ileum is small caliber decompressed. Contrast material is noted within stomach. No any contrast material is noted within small bowel or colon.  IMPRESSION: 1. There are fluid distended small bowel loops with multiple air-fluid levels highly suspicious for small bowel obstruction. Distal small bowel is small caliber decompressed. Terminal ileum is small caliber. Small amount of fluid/stranding noted adjacent to small bowel loops in left lower quadrant please see images 49 and 57. 2. Moderate stool noted in right colon and proximal transverse colon. Descending colon and sigmoid colon are empty collapsed. 3. No pericecal inflammation. 4. No hydronephrosis or hydroureter. 5. Status post cholecystectomy. Mild intrahepatic biliary ductal dilatation. CBD dilatation up to 1.1 cm. 6. No hydronephrosis or hydroureter. 7. Status post hysterectomy. These results were called by telephone at the time of interpretation on 03/18/2015 at 8:59 am to Dr. Gerhard Munch , who verbally acknowledged these results.   Electronically Signed   By: Natasha Mead M.D.   On: 03/18/2015 08:59   US Renal  03/25/2015   CLINICAL DATA:  Acute renal failure.  EXAM: RENAL / URINARY TRACT ULTRASOUND COMPLETE  COMPARISON:  None.  FINDINGS: Right Kidney:  Length: 8.1 cm. Increased echogenicity consistent  with chronic medical renal disease. No mass or  hydronephrosis visualized.  Left Kidney:  Length: 9.3 cm. Increased echogenicity consistent chronic medical renal disease. No mass or hydronephrosis visualized.  Bladder:  Bladder decompressed by Foley catheter.  IMPRESSION: Bilateral echodense kidneys consistent chronic medical renal disease. No acute abnormality. No hydronephrosis or bladder distention.   Electronically Signed   By: Maisie Fus  Register   On: 03/25/2015 13:27   Dg Chest Port 1 View  03/27/2015   CLINICAL DATA:  Left PICC placement. Nausea and vomiting. Initial encounter.  EXAM: PORTABLE CHEST - 1 VIEW  COMPARISON:  Chest radiograph performed 03/26/2015  FINDINGS: The patient's left PICC is noted ending about the proximal SVC. An enteric tube is noted extending below the diaphragm.  The lungs are hypoexpanded. Bibasilar and right upper lung zone airspace opacities may reflect atelectasis or pneumonia. No pleural effusion or pneumothorax is seen  The cardiomediastinal silhouette is borderline normal in size. No acute osseous abnormalities are identified.  IMPRESSION: 1. Left PICC noted ending about the proximal SVC. 2. Lungs hypoexpanded. Bibasilar and right upper lung zone airspace opacities may reflect atelectasis or pneumonia.  These results were called by telephone at the time of interpretation on 03/27/2015 at 7:33 pm to Nursing in the [facility], who verbally acknowledged these results.   Electronically Signed   By: Roanna Raider M.D.   On: 03/27/2015 19:34   Dg Chest Port 1 View  03/26/2015   CLINICAL DATA:  Atelectasis  EXAM: PORTABLE CHEST - 1 VIEW  COMPARISON:  Yesterday  FINDINGS: NG tube and left PICC are stable. Upper normal heart size. Low volumes. Bibasilar atelectasis is not significantly changed. No pneumothorax.  IMPRESSION: Stable bibasilar atelectasis.   Electronically Signed   By: Jolaine Click M.D.   On: 03/26/2015 08:14   Dg Chest Port 1 View  03/25/2015   CLINICAL DATA:  Tachycardia and hypotension; concern  for sepsis  EXAM: PORTABLE CHEST - 1 VIEW  COMPARISON:  May 27, 2014  FINDINGS: There is patchy atelectasis in both lung bases. The degree of inspiration is shallow. There is no frank airspace consolidation. Heart is upper normal in size with pulmonary vascularity within normal limits.  Central catheter tip is in the superior vena cava. Nasogastric tube tip and side port are in the distal stomach. No pneumothorax.  IMPRESSION: Bibasilar atelectasis. No pneumothorax. Degree of inspiration shallow.   Electronically Signed   By: Bretta Bang III M.D.   On: 03/25/2015 09:03   Dg Abd Portable 1v  03/24/2015   CLINICAL DATA:  Small-bowel obstruction  EXAM: PORTABLE ABDOMEN - 1 VIEW  COMPARISON:  03/23/2015  FINDINGS: NG tube is been placed with the tip in the gastric antrum  Small bowel dilatation shows mild interval improvement. Colon is decompressed with moderate stool in the right colon. Surgical clips in the gallbladder fossa.  IMPRESSION: Mild improvement in small bowel obstruction. NG tube in the gastric antrum.   Electronically Signed   By: Marlan Palau M.D.   On: 03/24/2015 08:25   Dg Abd Portable 1v  03/23/2015   CLINICAL DATA:  Followup small bowel obstruction.  EXAM: PORTABLE ABDOMEN - 1 VIEW  COMPARISON:  03/22/2015  FINDINGS: Dilated loops small bowel are similar to the prior exam allowing for differences in radiographic technique different degrees of magnification.  There is a small amount of air in a nondistended colon.  IMPRESSION: 1. Persistent high-grade partial small bowel obstruction. No significant change from the  previous day's study.   Electronically Signed   By: Amie Portland M.D.   On: 03/23/2015 10:01   Dg Abd Portable 1v  03/22/2015   CLINICAL DATA:  81 year old female with recent small bowel obstruction. Initial encounter.  EXAM: PORTABLE ABDOMEN - 1 VIEW  COMPARISON:  03/21/2015 and earlier, including CT Abdomen and Pelvis 03/18/2015.  FINDINGS: Portable AP supine view at 0517  hrs. Enteric tube has been removed. Recurrent dilated gas-filled small bowel loops in the mid abdomen up to 41 mm diameter. Abundant stool now at the splenic flexure. Decreased distal colon gas in the pelvis. Stable cholecystectomy clips. Stable abdominal and pelvic visceral contours. Grossly stable lung bases. Stable visualized osseous structures.  IMPRESSION: NG tube removed with recurrent small bowel obstruction.   Electronically Signed   By: Odessa Fleming M.D.   On: 03/22/2015 07:42   Dg Abd Portable 1v  03/21/2015   CLINICAL DATA:  Small-bowel obstruction .  EXAM: PORTABLE ABDOMEN - 1 VIEW  COMPARISON:  03/20/2015 .  FINDINGS: NG tube noted with tip in the upper portion stomach. Slight advancement should be considered . Surgical clips right upper quadrant Soft tissue structures are unremarkable. Small-bowel distention has improved. No free air. Stool noted throughout the colon. No acute bony abnormality.  IMPRESSION: 1. NG tube noted with tip in the upper portion of the stomach. Advancement suggested.  2. Interim improvement of small bowel distention. Colonic gas pattern is normal with stool throughout the colon.  These results will be called to the ordering clinician or representative by the Radiologist Assistant, and communication documented in the PACS or zVision Dashboard.   Electronically Signed   By: Maisie Fus  Register   On: 03/21/2015 07:32   Dg Abd Portable 1v  03/20/2015   CLINICAL DATA:  Followup small bowel obstruction.  EXAM: PORTABLE ABDOMEN - 1 VIEW  COMPARISON:  03/19/2015  FINDINGS: Mild small bowel dilation persists most evident in the left upper quadrant. There has been no substantial change from the prior study. Residual contrast is noted in the bladder, also similar to the prior exam. Nasogastric tube tip projects in the distal stomach.  IMPRESSION: Small bowel dilation persists consistent with a persistent partial small bowel obstruction. No change from the previous day's study.    Electronically Signed   By: Amie Portland M.D.   On: 03/20/2015 07:29   Dg Abd Portable 1v  03/19/2015   CLINICAL DATA:  Small bowel obstruction protocol. Small bowel obstruction.  EXAM: PORTABLE ABDOMEN - 1 VIEW  COMPARISON:  03/18/2015.  FINDINGS: This is a 24 hr film. There is no contrast identified in the cecum. On the prior exam at 2046 hr yesterday, oral contrast was present in the stomach. Excreted contrast is present within the urinary bladder. Dilation of small bowel loops measures 36 mm. Cholecystectomy clips are present in the right upper quadrant.  There is less small bowel contrast than expected which may be due to dilution in the proximal small bowel. Nasogastric tube remains in the stomach with the tip at the antrum.  Large amount of stool is present in the colon.  IMPRESSION: Small bowel obstruction with no contrast identified in the cecum at 24 hr.   Electronically Signed   By: Andreas Newport M.D.   On: 03/19/2015 17:11   Dg Abd Portable 1v-small Bowel Obstruction Protocol-initial, 8 Hr Delay  03/19/2015   CLINICAL DATA:  Small bowel obstruction  EXAM: PORTABLE ABDOMEN - 1 VIEW  COMPARISON:  Radiographs and CT  03/18/2015  FINDINGS: Nasogastric tube extends into the distal stomach. There is contrast in the gastric lumen. There is no significant contrast in the small bowel or colon. Dilated stacked loops of small bowel persist in the mid abdomen, probably unchanged. No free air is evident on this single AP supine portable radiograph.  IMPRESSION: Enteric contrast has not reached the colon. Persistent small bowel dilatation consistent with small-bowel obstruction.   Electronically Signed   By: Ellery Plunk M.D.   On: 03/19/2015 01:48   Dg Abd Portable 1v-small Bowel Protocol-position Verification  03/18/2015   CLINICAL DATA:  Small bowel protocol, nasogastric tube placement  EXAM: PORTABLE ABDOMEN - 1 VIEW  COMPARISON:  Portable exam 1303 hours compared to CT abdomen and pelvis 03/18/2015   FINDINGS: Tip of nasogastric tube projects over distal gastric antrum or duodenal bulb, patient slightly rotated to the RIGHT.  Excreted contrast material within renal collecting systems and distended urinary bladder.  Air-filled loops of dilated small bowel noted in the mid abdomen suspicious for small bowel obstruction.  Some gas and stool remain present within the colon.  Degenerative disc and facet disease changes thoracolumbar spine.  Osseous demineralization.  IMPRESSION: Tip of nasogastric tube projects over either the distal gastric antrum or the duodenal bulb region.  Dilated small bowel loops question small bowel obstruction.   Electronically Signed   By: Ulyses Southward M.D.   On: 03/18/2015 13:16   Dg Foot 2 Views Right  03/22/2015   CLINICAL DATA:  Dorsal right foot pain for 4 days, no known injury, initial encounter  EXAM: RIGHT FOOT - 2 VIEW  COMPARISON:  None.  FINDINGS: Degenerative changes are noted in the tarsal bones. No acute fracture or dislocation is noted. A small calcaneal spur is noted.  IMPRESSION: Degenerative change without acute abnormality.   Electronically Signed   By: Alcide Clever M.D.   On: 03/22/2015 14:57     Medical Consultants:    Surgery - Dr. Glenna Fellows   Cardiology   PCCM  Neurology - Dr. Thana Farr   Anti-Infectives:    Mefoxin 03/25/2015 --> 03/26/2015  Rocephin 03/26/2015 -->   Flagyl 03/26/2015 -->  Subjective:   [name] reports abdominal pain and "phlegm" in her throat.  No N/V with NG tube clamped so far.  No dyspnea.  Objective:    Filed Vitals:   03/29/15 1831 03/29/15 2037 03/29/15 2355 03/30/15 0432  BP: 147/100 160/64 150/55 155/61  Pulse:  88 92 94  Temp:  98.5 F (36.9 C) 98.7 F (37.1 C) 97.5 F (36.4 C)  TempSrc:  Oral Oral Axillary  Resp:  Height:      Weight:      SpO2:  100% 100% 100%    Intake/Output Summary (Last 24 hours) at 03/30/15 0724 Last data filed at 03/30/15 0300  Gross  per 24 hour  Intake 1658.83 ml  Output   2025 ml  Net -366.17 ml    Exam: Gen:  NAD, weak Cardiovascular:  RRR, No M/R/G Respiratory:  Lungs CTAB Gastrointestinal:  Abdomen soft, tender, minimal BS Extremities:  1+ edema   Data Reviewed:    Labs: Basic Metabolic Panel:  Recent Labs Lab 03/24/15 0533 03/25/15 0615 03/26/15 0510 03/27/15 0404 03/28/15 0357 03/29/15 0528  NA 136 130* 130* 132* 134* 133*  K 3.4* 5.0 4.3 4.1 2.8* 3.7  CL 99* 99* 99* 99* 100* 104  CO2 27 19* GLUCOSE 159*  154* 143* 117* 153* 149*  BUN 22* 29* 41* 45* 49* 51*  CREATININE 0.85 2.20* 2.01* 1.56* 1.28* 1.08*  CALCIUM 8.4* 7.7* 7.7* 8.2* 8.1* 7.7*  MG 1.4* 1.6* 2.1 2.1 1.7 1.9  PHOS 3.6 3.4 2.8 1.7* 3.1  --    GFR Estimated Creatinine Clearance: 39.9 mL/min (by C-G formula based on Cr of 1.08). Liver Function Tests:  Recent Labs Lab 03/24/15 0533 03/28/15 0357 03/29/15 0528  AST 17 38 33  ALT 10* 18 18  ALKPHOS 63 86 98  BILITOT 0.5 0.2* 0.4  PROT 6.6 5.6* 5.6*  ALBUMIN 2.6* 1.9* 1.7*   Coagulation profile  Recent Labs Lab 03/27/15 2348  INR 1.06    CBC:  Recent Labs Lab 03/23/15 1000 03/24/15 0533 03/25/15 0615 03/26/15 0510 03/28/15 0357  WBC 7.5 6.1 24.8* 24.8* 15.4*  NEUTROABS 5.7 4.6 23.9*  --  14.3*  HGB 11.2* 9.1* 10.2* 8.3* 8.4*  HCT 35.9* 29.2* 31.7* 26.4* 24.6*  MCV 94.2 92.7 91.9 91.0 86.6  PLT 309 247 182 132* 131*   Cardiac Enzymes:  Recent Labs Lab 03/25/15 1315 03/25/15 2223 03/26/15 0510  TROPONINI 0.03 0.03 0.03   CBG:  Recent Labs Lab 03/29/15 1126 03/29/15 1617 03/29/15 2031 03/29/15 2358 03/30/15 0425  GLUCAP 101* 163* 128* 108* 161*   Hgb A1c:  Recent Labs  03/28/15 0357  HGBA1C 6.3*   Lipid Profile:  Recent Labs  03/28/15 0357  CHOL 99  HDL <10*  LDLCALC NOT CALCULATED  TRIG 91  93  CHOLHDL NOT CALCULATED   Sepsis Labs:  Recent Labs Lab 03/24/15 0533 03/25/15 0615 03/25/15 0926  03/25/15 1315 03/26/15 0510 03/27/15 0404 03/28/15 0357  PROCALCITON  --   --  >175.00 147.10 80.81 42.12  --   WBC 6.1 24.8*  --   --  24.8*  --  15.4*  LATICACIDVEN  --   --  2.2* 2.3*  --   --   --    Microbiology Recent Results (from the past 240 hour(s))  MRSA PCR Screening     Status: None   Collection Time: 03/24/15  8:52 AM  Result Value Ref Range Status   MRSA by PCR NEGATIVE NEGATIVE Final    Comment:        The GeneXpert MRSA Assay (FDA approved for NASAL specimens only), is one component of a comprehensive MRSA colonization surveillance program. It is not intended to diagnose MRSA infection nor to guide or monitor treatment for MRSA infections.   Culture, blood (x 2)     Status: None (Preliminary result)   Collection Time: 03/25/15  9:30 AM  Result Value Ref Range Status   Specimen Description BLOOD RIGHT HAND  Final   Special Requests BOTTLES DRAWN AEROBIC ONLY 5CC  Final   Culture   Final           BLOOD CULTURE RECEIVED NO GROWTH TO DATE CULTURE WILL BE HELD FOR 5 DAYS BEFORE ISSUING A FINAL NEGATIVE REPORT Performed at [facility]    Report Status PENDING  Incomplete  Culture, blood (x 2)     Status: None (Preliminary result)   Collection Time: 03/25/15  9:35 AM  Result Value Ref Range Status   Specimen Description BLOOD RIGHT ARM  Final   Special Requests BOTTLES DRAWN AEROBIC AND ANAEROBIC 6CC  Final   Culture   Final           BLOOD CULTURE RECEIVED NO GROWTH TO DATE CULTURE WILL BE HELD  FOR 5 DAYS BEFORE ISSUING A FINAL NEGATIVE REPORT Performed at [facility]    Report Status PENDING  Incomplete  Culture, Urine     Status: None   Collection Time: 03/25/15 11:57 AM  Result Value Ref Range Status   Specimen Description URINE, CATHETERIZED  Final   Special Requests NONE  Final   Colony Count NO GROWTH Performed at [facility]   Final   Culture NO GROWTH Performed at [facility]   Final   Report Status  03/26/2015 FINAL  Final     Medications:   . antiseptic oral rinse  7 mL Mouth Rinse q12n4p  . aspirin  300 mg Rectal Daily   Or  . aspirin  325 mg Oral Daily  . cefTRIAXone (ROCEPHIN)  IV  2 g Intravenous Q24H  . chlorhexidine  15 mL Mouth Rinse BID  . insulin aspart  0-15 Units Subcutaneous 6 times per day  . lip balm  1 application Topical BID  . metoprolol  2.5 mg Intravenous 4 times per day  . metronidazole  500 mg Intravenous Q6H   Continuous Infusions: . sodium chloride 10 mL/hr (03/29/15 0800)  . Marland KitchenTPN (CLINIMIX-E) Adult 70 mL/hr at 03/29/15 1751   And  . fat emulsion 240 mL (03/29/15 1750)  . lactated ringers Stopped (03/28/15 0800)    Time spent: 25 minutes.   LOS: 12 days   Chanti Golubski  [facility] Pager [phone]. If unable to reach me by pager, please call my cell phone at [phone].  *Please refer to [email or web address], password [email or web address] to get updated schedule on who will round on this patient, as hospitalists switch teams weekly. If 7PM-7AM, please contact night-coverage at [email or web address], password [email or web address] for any overnight needs.  03/30/2015, 7:24 AM

## 2015-03-31 ENCOUNTER — Inpatient Hospital Stay (HOSPITAL_COMMUNITY): Payer: Commercial Managed Care - HMO

## 2015-03-31 ENCOUNTER — Encounter: Payer: Self-pay | Admitting: *Deleted

## 2015-03-31 DIAGNOSIS — K219 Gastro-esophageal reflux disease without esophagitis: Secondary | ICD-10-CM

## 2015-03-31 DIAGNOSIS — I639 Cerebral infarction, unspecified: Secondary | ICD-10-CM

## 2015-03-31 LAB — COMPREHENSIVE METABOLIC PANEL
ALK PHOS: 98 U/L (ref 38–126)
ALT: 14 U/L (ref 14–54)
ANION GAP: 9 (ref 5–15)
AST: 26 U/L (ref 15–41)
Albumin: 1.8 g/dL — ABNORMAL LOW (ref 3.5–5.0)
BILIRUBIN TOTAL: 0.4 mg/dL (ref 0.3–1.2)
BUN: 48 mg/dL — ABNORMAL HIGH (ref 6–20)
CO2: 25 mmol/L (ref 22–32)
Calcium: 8.2 mg/dL — ABNORMAL LOW (ref 8.9–10.3)
Chloride: 102 mmol/L (ref 101–111)
Creatinine, Ser: 0.98 mg/dL (ref 0.44–1.00)
GFR calc Af Amer: >60 mL/min (ref >60)
GFR, EST NON AFRICAN AMERICAN: 53 mL/min — AB (ref >60)
GLUCOSE: 176 mg/dL — AB (ref 65–99)
POTASSIUM: 4.8 mmol/L (ref 3.5–5.1)
SODIUM: 136 mmol/L (ref 135–145)
Total Protein: 5.9 g/dL — ABNORMAL LOW (ref 6.5–8.1)

## 2015-03-31 LAB — MAGNESIUM: Magnesium: 1.7 mg/dL (ref 1.7–2.4)

## 2015-03-31 LAB — PHOSPHORUS: Phosphorus: 4.5 mg/dL (ref 2.5–4.6)

## 2015-03-31 LAB — CULTURE, BLOOD (ROUTINE X 2)
Culture: NO GROWTH
Culture: NO GROWTH

## 2015-03-31 LAB — GLUCOSE, CAPILLARY
GLUCOSE-CAPILLARY: 154 mg/dL — AB (ref 65–99)
Glucose-Capillary: 174 mg/dL — ABNORMAL HIGH (ref 65–99)
Glucose-Capillary: 177 mg/dL — ABNORMAL HIGH (ref 65–99)
Glucose-Capillary: 161 mg/dL — ABNORMAL HIGH (ref 65–99)

## 2015-03-31 MED ORDER — SODIUM CHLORIDE 0.9 % IJ SOLN
10.0000 mL | INTRAMUSCULAR | Status: DC | PRN
  Administered 2015-03-31: 20 mL
  Administered 2015-04-01 – 2015-04-03 (×2): 10 mL
  Administered 2015-04-04: 40 mL
  Administered 2015-04-04: 10 mL
  Administered 2015-04-05: 20 mL
  Administered 2015-04-06 – 2015-04-18 (×4): 10 mL
  Administered 2015-04-18: 30 mL
  Administered 2015-04-19 – 2015-04-20 (×2): 10 mL
  Filled 2015-03-31 (×13): qty 40

## 2015-03-31 MED ORDER — FAT EMULSION 20 % IV EMUL
240.0000 mL | INTRAVENOUS | Status: AC
  Administered 2015-03-31: 240 mL via INTRAVENOUS
  Filled 2015-03-31: qty 250

## 2015-03-31 MED ORDER — MAGNESIUM SULFATE IN D5W 10-5 MG/ML-% IV SOLN
1.0000 g | Freq: Once | INTRAVENOUS | Status: AC
  Administered 2015-03-31: 1 g via INTRAVENOUS
  Filled 2015-03-31: qty 100

## 2015-03-31 MED ORDER — TRACE MINERALS CR-CU-MN-SE-ZN 10-1000-500-60 MCG/ML IV SOLN
INTRAVENOUS | Status: AC
  Administered 2015-03-31: 17:00:00 via INTRAVENOUS
  Filled 2015-03-31: qty 1680

## 2015-03-31 NOTE — Progress Notes (Signed)
Progress Note   DALEYSSA LOISELLE WJX:[id number] DOB: [birth year] DOA: 03/18/2015 PCP: [name], MD   Brief Narrative:   [name] is an 81 y.o. female with a PMH of hypertension, CAD, hyperlipidemia and diabetes who was admitted 03/18/15 with SBO secondary to adhesions. She failed conservative therapy and underwent laparoscopic lysis of adhesions/laparotomy with bowel decompression on 03/24/15. Her hospital course was complicated by sepsis secondary to peritoneal contamination with transfer to the SDU on 03/25/15. She also developed left-sided facial droop/right arm weakness 03/27/15 with CT scan showing a 4 mm lacunar infarct in the left lentiform nucleus. MRI failed to show any abnormality. Neurology is following. Barrier to discharge his ongoing need for TPN for nutritional purposes.  Assessment/Plan:   Principal Problem:   Small bowel obstruction s/p exlap/LOA/decompression 03/25/2015 complicated by severe sepsis secondary to peritonitis / abdominal pain - Status post laparoscopic lysis of adhesions/laparotomy with bowel decompression 03/24/15. - Subsequently developed acute peritonitis/sepsis. - Initially treated with Mefoxin starting 03/25/15. Antibiotics switched to Flagyl/Rocephin 03/26/15. - Transferred out of the SDU 03/29/15 given her clinical stability. - Remains nothing by mouth, on TNA for nutrition.  NG d/c'd today.  Active Problems:   Facial droop / right arm weakness / possible acute CVA left lentiform nucleus - On 03/27/15, CT of the head done to evaluate symptoms showed an acute 4 mm lacunar infarct in the left lentiform nucleus. - Subsequent MRI negative, although clinical findings consistent with acute CVA. MRA negative for stenosis. - 2-D echo with normal EF, no diastolic dysfunction noted. No intracardiac masses or thrombi. - Carotid Dopplers negative for significant stenosis (1-39% bilaterally). - Hemoglobin A1c 6.3%. Cholesterol 99. - Evaluated by neurology, no  further stroke workup recommended. - Continue aspirin. - PT/OT.    Hypokalemia/hypomagnesemia - Monitor and replace electrolytes as needed.    Acute postoperative blood loss anemia - Initial hemoglobin 10.2, has dropped approximately 2 g postoperatively. - No current indication for transfusion.  Check CBC in a.m.    Diabetes type 2, controlled - Currently being managed with moderate scale SSI every 4 hours. CBGs 120-174. - Hemoglobin A1c 6.3%.    Anxiety state/Adjustment disorder with mixed anxiety and depressed mood - Monitor.    Coronary atherosclerosis - Continue aspirin therapy.    Acute renal failure  - Secondary to sepsis. Creatinine steadily improving.    Right foot pain  - Radiographs negative for fracture. Degenerative changes noted.    Polymyalgia rheumatica/Chronic fatigue disorder - PT/OT.    Elevated blood pressure - Continue as needed Lopressor.    Malnutrition of moderate degree - Continue TPN for nutritional support.    Acute delirium - Avoid sedating medications. Family requests patient does not get Ativan or Haldol.    Thrombocytopenia - Mild. Likely related to recent sepsis.    DVT Prophylaxis - Continue SCDs.  Code Status: Full. Family Communication: 2 Daughters, at the bedside.   Disposition Plan: Home with daughter when diet advanced, difficult to predict due to long hospitalization, ongoing need for IV pain medication, NPO status and frailty.   IV Access:    PICC placed 03/23/15   Procedures and diagnostic studies:   Ct Head Wo Contrast  03/27/2015   CLINICAL DATA:  RIGHT-sided facial droop. Symptoms began earlier today. Altered mental status.  EXAM: CT HEAD WITHOUT CONTRAST  TECHNIQUE: Contiguous axial images were obtained from the base of the skull through the vertex without intravenous contrast.  COMPARISON:  05/27/2014.  FINDINGS: Possible acute  4 mm lacune, LEFT lentiform nucleus as seen on image 12 series 5. This was not clearly  present on in 2015 scan.  No other areas of concern for cortical or subcortical infarction.  No hemorrhage, mass lesion, hydrocephalus, or extra-axial fluid.  Cerebral and cerebellar atrophy. Generalized white matter hypoattenuation, likely small vessel disease. Than the calvarium intact. Vascular calcification. No sinus or mastoid disease. Dense lenticular opacities.  IMPRESSION: Possible acute 4 mm lacunar infarct LEFT lentiform nucleus. If further investigation desired, and no contraindications, consider MRI brain.   Electronically Signed   By: Davonna Belling M.D.   On: 03/27/2015 16:25   Mr Maxine Glenn Head Wo Contrast  03/28/2015   CLINICAL DATA:  81 year old with right-sided facial droop. Altered mental status. Subsequent encounter.  EXAM: MRI HEAD WITHOUT CONTRAST  MRA HEAD WITHOUT CONTRAST  TECHNIQUE: Multiplanar, multiecho pulse sequences of the brain and surrounding structures were obtained without intravenous contrast. Angiographic images of the head were obtained using MRA technique without contrast.  COMPARISON:  03/27/2015 head CT.  FINDINGS: MRI HEAD FINDINGS  No acute infarct.  No intracranial hemorrhage.  Remote tiny infarct right cerebellum. Minimal small vessel disease type changes.  Incidentally noted is a prominent peri vascular space left lenticular nucleus.  Global atrophy without hydrocephalus.  No intracranial mass lesion noted on this unenhanced exam.  Cervical medullary junction unremarkable. Mild spinal stenosis C3-4.  Expanded partially empty sella without other findings of pseudotumor cerebri  Pineal region unremarkable.  No intracranial mass lesion noted on this unenhanced exam.  MRA HEAD FINDINGS  Mild narrowing pre cavernous/ cavernous segment right internal carotid artery.  Mild ectasia left internal carotid artery cavernous segment. The minimal bulge along the left internal carotid artery cavernous segment most likely related to slight ectasia of the left ophthalmic artery origin rather  than small aneurysm. Mild motion degradation limits evaluation.  Slightly ectatic anterior communicating artery without aneurysm.  Middle cerebral artery mild branch vessel irregularity bilaterally.  No significant stenosis distal vertebral arteries or basilar artery. Regions of mild irregularity and slight narrowing.  IMPRESSION: MRI HEAD  No acute infarct.  Remote tiny infarct right cerebellum.  Incidentally noted is a prominent peri vascular space left lenticular nucleus.  Global atrophy without hydrocephalus.  Expanded partially empty sella without other findings of pseudotumor cerebri  MRA HEAD FINDINGS  No medium or large size vessel significant stenosis or occlusion. Evaluation of branch vessels slightly limited by motion degradation.   Electronically Signed   By: Lacy Duverney M.D.   On: 03/28/2015 12:02   Mr Brain Wo Contrast  03/28/2015   CLINICAL DATA:  81 year old with right-sided facial droop. Altered mental status. Subsequent encounter.  EXAM: MRI HEAD WITHOUT CONTRAST  MRA HEAD WITHOUT CONTRAST  TECHNIQUE: Multiplanar, multiecho pulse sequences of the brain and surrounding structures were obtained without intravenous contrast. Angiographic images of the head were obtained using MRA technique without contrast.  COMPARISON:  03/27/2015 head CT.  FINDINGS: MRI HEAD FINDINGS  No acute infarct.  No intracranial hemorrhage.  Remote tiny infarct right cerebellum. Minimal small vessel disease type changes.  Incidentally noted is a prominent peri vascular space left lenticular nucleus.  Global atrophy without hydrocephalus.  No intracranial mass lesion noted on this unenhanced exam.  Cervical medullary junction unremarkable. Mild spinal stenosis C3-4.  Expanded partially empty sella without other findings of pseudotumor cerebri  Pineal region unremarkable.  No intracranial mass lesion noted on this unenhanced exam.  MRA HEAD FINDINGS  Mild narrowing pre cavernous/  cavernous segment right internal carotid  artery.  Mild ectasia left internal carotid artery cavernous segment. The minimal bulge along the left internal carotid artery cavernous segment most likely related to slight ectasia of the left ophthalmic artery origin rather than small aneurysm. Mild motion degradation limits evaluation.  Slightly ectatic anterior communicating artery without aneurysm.  Middle cerebral artery mild branch vessel irregularity bilaterally.  No significant stenosis distal vertebral arteries or basilar artery. Regions of mild irregularity and slight narrowing.  IMPRESSION: MRI HEAD  No acute infarct.  Remote tiny infarct right cerebellum.  Incidentally noted is a prominent peri vascular space left lenticular nucleus.  Global atrophy without hydrocephalus.  Expanded partially empty sella without other findings of pseudotumor cerebri  MRA HEAD FINDINGS  No medium or large size vessel significant stenosis or occlusion. Evaluation of branch vessels slightly limited by motion degradation.   Electronically Signed   By: Lacy Duverney M.D.   On: 03/28/2015 12:02   Ct Abdomen Pelvis W Contrast  03/18/2015   CLINICAL DATA:  Nausea and vomiting for 4 days, anxiety, status post hysterectomy CT pelvis 05/27/2014  EXAM: CT ABDOMEN AND PELVIS WITH CONTRAST  TECHNIQUE: Multidetector CT imaging of the abdomen and pelvis was performed using the standard protocol following bolus administration of intravenous contrast.  CONTRAST:  84mL OMNIPAQUE IOHEXOL 300 MG/ML SOLN, OMNIPAQUE IOHEXOL 300 MG/ML SOLN  COMPARISON:  None.  FINDINGS: Sagittal images of the spine shows diffuse osteopenia. Degenerative changes thoracolumbar spine. The lung bases are unremarkable.  The patient is status post cholecystectomy. Mild intrahepatic biliary ductal dilatation. There is CBD dilatation up to 1.1 cm. No focal hepatic mass. The pancreas, spleen and adrenal glands are unremarkable. Kidneys are symmetrical in size and enhancement. No hydronephrosis or hydroureter.  Delayed renal images shows bilateral renal symmetrical excretion.  Atherosclerotic calcifications of abdominal aorta and iliac arteries. Moderate stool noted in right colon and proximal transverse colon. No colonic obstruction. The descending colon and sigmoid colon are empty collapsed. Some stool noted within rectum.  There are fluid distended small bowel loops in mid upper abdomen and left lower abdomen and pelvis. Small amount of fluid/stranding noted a adjacent to small bowel loops in left lower quadrant see axial image 57. Distal small bowel is decreased caliber collapsed. Findings are consistent with small bowel obstruction.  Mild distended urinary bladder. The patient is status post hysterectomy. The terminal ileum is small caliber decompressed. Contrast material is noted within stomach. No any contrast material is noted within small bowel or colon.  IMPRESSION: 1. There are fluid distended small bowel loops with multiple air-fluid levels highly suspicious for small bowel obstruction. Distal small bowel is small caliber decompressed. Terminal ileum is small caliber. Small amount of fluid/stranding noted adjacent to small bowel loops in left lower quadrant please see images 49 and 57. 2. Moderate stool noted in right colon and proximal transverse colon. Descending colon and sigmoid colon are empty collapsed. 3. No pericecal inflammation. 4. No hydronephrosis or hydroureter. 5. Status post cholecystectomy. Mild intrahepatic biliary ductal dilatation. CBD dilatation up to 1.1 cm. 6. No hydronephrosis or hydroureter. 7. Status post hysterectomy. These results were called by telephone at the time of interpretation on 03/18/2015 at 8:59 am to Dr. Gerhard Munch , who verbally acknowledged these results.   Electronically Signed   By: Natasha Mead M.D.   On: 03/18/2015 08:59   US Renal  03/25/2015   CLINICAL DATA:  Acute renal failure.  EXAM: RENAL / URINARY TRACT ULTRASOUND  COMPLETE  COMPARISON:  None.  FINDINGS:  Right Kidney:  Length: 8.1 cm. Increased echogenicity consistent with chronic medical renal disease. No mass or hydronephrosis visualized.  Left Kidney:  Length: 9.3 cm. Increased echogenicity consistent chronic medical renal disease. No mass or hydronephrosis visualized.  Bladder:  Bladder decompressed by Foley catheter.  IMPRESSION: Bilateral echodense kidneys consistent chronic medical renal disease. No acute abnormality. No hydronephrosis or bladder distention.   Electronically Signed   By: Maisie Fus  Register   On: 03/25/2015 13:27   Dg Chest Port 1 View  03/27/2015   CLINICAL DATA:  Left PICC placement. Nausea and vomiting. Initial encounter.  EXAM: PORTABLE CHEST - 1 VIEW  COMPARISON:  Chest radiograph performed 03/26/2015  FINDINGS: The patient's left PICC is noted ending about the proximal SVC. An enteric tube is noted extending below the diaphragm.  The lungs are hypoexpanded. Bibasilar and right upper lung zone airspace opacities may reflect atelectasis or pneumonia. No pleural effusion or pneumothorax is seen  The cardiomediastinal silhouette is borderline normal in size. No acute osseous abnormalities are identified.  IMPRESSION: 1. Left PICC noted ending about the proximal SVC. 2. Lungs hypoexpanded. Bibasilar and right upper lung zone airspace opacities may reflect atelectasis or pneumonia.  These results were called by telephone at the time of interpretation on 03/27/2015 at 7:33 pm to Nursing in the [employer], who verbally acknowledged these results.   Electronically Signed   By: Roanna Raider M.D.   On: 03/27/2015 19:34   Dg Chest Port 1 View  03/26/2015   CLINICAL DATA:  Atelectasis  EXAM: PORTABLE CHEST - 1 VIEW  COMPARISON:  Yesterday  FINDINGS: NG tube and left PICC are stable. Upper normal heart size. Low volumes. Bibasilar atelectasis is not significantly changed. No pneumothorax.  IMPRESSION: Stable bibasilar atelectasis.   Electronically Signed   By: Jolaine Click M.D.    On: 03/26/2015 08:14   Dg Chest Port 1 View  03/25/2015   CLINICAL DATA:  Tachycardia and hypotension; concern for sepsis  EXAM: PORTABLE CHEST - 1 VIEW  COMPARISON:  May 27, 2014  FINDINGS: There is patchy atelectasis in both lung bases. The degree of inspiration is shallow. There is no frank airspace consolidation. Heart is upper normal in size with pulmonary vascularity within normal limits.  Central catheter tip is in the superior vena cava. Nasogastric tube tip and side port are in the distal stomach. No pneumothorax.  IMPRESSION: Bibasilar atelectasis. No pneumothorax. Degree of inspiration shallow.   Electronically Signed   By: Bretta Bang III M.D.   On: 03/25/2015 09:03   Dg Abd Portable 1v  03/24/2015   CLINICAL DATA:  Small-bowel obstruction  EXAM: PORTABLE ABDOMEN - 1 VIEW  COMPARISON:  03/23/2015  FINDINGS: NG tube is been placed with the tip in the gastric antrum  Small bowel dilatation shows mild interval improvement. Colon is decompressed with moderate stool in the right colon. Surgical clips in the gallbladder fossa.  IMPRESSION: Mild improvement in small bowel obstruction. NG tube in the gastric antrum.   Electronically Signed   By: Marlan Palau M.D.   On: 03/24/2015 08:25   Dg Abd Portable 1v  03/23/2015   CLINICAL DATA:  Followup small bowel obstruction.  EXAM: PORTABLE ABDOMEN - 1 VIEW  COMPARISON:  03/22/2015  FINDINGS: Dilated loops small bowel are similar to the prior exam allowing for differences in radiographic technique different degrees of magnification.  There is a small amount of air in a  nondistended colon.  IMPRESSION: 1. Persistent high-grade partial small bowel obstruction. No significant change from the previous day's study.   Electronically Signed   By: Amie Portland M.D.   On: 03/23/2015 10:01   Dg Abd Portable 1v  03/22/2015   CLINICAL DATA:  81 year old female with recent small bowel obstruction. Initial encounter.  EXAM: PORTABLE ABDOMEN - 1 VIEW   COMPARISON:  03/21/2015 and earlier, including CT Abdomen and Pelvis 03/18/2015.  FINDINGS: Portable AP supine view at 0517 hrs. Enteric tube has been removed. Recurrent dilated gas-filled small bowel loops in the mid abdomen up to 41 mm diameter. Abundant stool now at the splenic flexure. Decreased distal colon gas in the pelvis. Stable cholecystectomy clips. Stable abdominal and pelvic visceral contours. Grossly stable lung bases. Stable visualized osseous structures.  IMPRESSION: NG tube removed with recurrent small bowel obstruction.   Electronically Signed   By: Odessa Fleming M.D.   On: 03/22/2015 07:42   Dg Abd Portable 1v  03/21/2015   CLINICAL DATA:  Small-bowel obstruction .  EXAM: PORTABLE ABDOMEN - 1 VIEW  COMPARISON:  03/20/2015 .  FINDINGS: NG tube noted with tip in the upper portion stomach. Slight advancement should be considered . Surgical clips right upper quadrant Soft tissue structures are unremarkable. Small-bowel distention has improved. No free air. Stool noted throughout the colon. No acute bony abnormality.  IMPRESSION: 1. NG tube noted with tip in the upper portion of the stomach. Advancement suggested.  2. Interim improvement of small bowel distention. Colonic gas pattern is normal with stool throughout the colon.  These results will be called to the ordering clinician or representative by the Radiologist Assistant, and communication documented in the PACS or zVision Dashboard.   Electronically Signed   By: Maisie Fus  Register   On: 03/21/2015 07:32   Dg Abd Portable 1v  03/20/2015   CLINICAL DATA:  Followup small bowel obstruction.  EXAM: PORTABLE ABDOMEN - 1 VIEW  COMPARISON:  03/19/2015  FINDINGS: Mild small bowel dilation persists most evident in the left upper quadrant. There has been no substantial change from the prior study. Residual contrast is noted in the bladder, also similar to the prior exam. Nasogastric tube tip projects in the distal stomach.  IMPRESSION: Small bowel dilation  persists consistent with a persistent partial small bowel obstruction. No change from the previous day's study.   Electronically Signed   By: Amie Portland M.D.   On: 03/20/2015 07:29   Dg Abd Portable 1v  03/19/2015   CLINICAL DATA:  Small bowel obstruction protocol. Small bowel obstruction.  EXAM: PORTABLE ABDOMEN - 1 VIEW  COMPARISON:  03/18/2015.  FINDINGS: This is a 24 hr film. There is no contrast identified in the cecum. On the prior exam at 2046 hr yesterday, oral contrast was present in the stomach. Excreted contrast is present within the urinary bladder. Dilation of small bowel loops measures 36 mm. Cholecystectomy clips are present in the right upper quadrant.  There is less small bowel contrast than expected which may be due to dilution in the proximal small bowel. Nasogastric tube remains in the stomach with the tip at the antrum.  Large amount of stool is present in the colon.  IMPRESSION: Small bowel obstruction with no contrast identified in the cecum at 24 hr.   Electronically Signed   By: Andreas Newport M.D.   On: 03/19/2015 17:11   Dg Abd Portable 1v-small Bowel Obstruction Protocol-initial, 8 Hr Delay  03/19/2015   CLINICAL DATA:  Small bowel obstruction  EXAM: PORTABLE ABDOMEN - 1 VIEW  COMPARISON:  Radiographs and CT 03/18/2015  FINDINGS: Nasogastric tube extends into the distal stomach. There is contrast in the gastric lumen. There is no significant contrast in the small bowel or colon. Dilated stacked loops of small bowel persist in the mid abdomen, probably unchanged. No free air is evident on this single AP supine portable radiograph.  IMPRESSION: Enteric contrast has not reached the colon. Persistent small bowel dilatation consistent with small-bowel obstruction.   Electronically Signed   By: Ellery Plunk M.D.   On: 03/19/2015 01:48   Dg Abd Portable 1v-small Bowel Protocol-position Verification  03/18/2015   CLINICAL DATA:  Small bowel protocol, nasogastric tube placement   EXAM: PORTABLE ABDOMEN - 1 VIEW  COMPARISON:  Portable exam 1303 hours compared to CT abdomen and pelvis 03/18/2015  FINDINGS: Tip of nasogastric tube projects over distal gastric antrum or duodenal bulb, patient slightly rotated to the RIGHT.  Excreted contrast material within renal collecting systems and distended urinary bladder.  Air-filled loops of dilated small bowel noted in the mid abdomen suspicious for small bowel obstruction.  Some gas and stool remain present within the colon.  Degenerative disc and facet disease changes thoracolumbar spine.  Osseous demineralization.  IMPRESSION: Tip of nasogastric tube projects over either the distal gastric antrum or the duodenal bulb region.  Dilated small bowel loops question small bowel obstruction.   Electronically Signed   By: Ulyses Southward M.D.   On: 03/18/2015 13:16   Dg Foot 2 Views Right  03/22/2015   CLINICAL DATA:  Dorsal right foot pain for 4 days, no known injury, initial encounter  EXAM: RIGHT FOOT - 2 VIEW  COMPARISON:  None.  FINDINGS: Degenerative changes are noted in the tarsal bones. No acute fracture or dislocation is noted. A small calcaneal spur is noted.  IMPRESSION: Degenerative change without acute abnormality.   Electronically Signed   By: Alcide Clever M.D.   On: 03/22/2015 14:57     Medical Consultants:    Surgery - Dr. Glenna Fellows   Cardiology   PCCM  Neurology - Dr. Thana Farr   Anti-Infectives:    Mefoxin 03/25/2015 --> 03/26/2015  Rocephin 03/26/2015 -->   Flagyl 03/26/2015 -->  Subjective:   [name] reports ongoing abdominal pain, RN reports that she is getting fentanyl every hour.  No N/V with NG tube out, D/C'd this morning.  No dyspnea.  Lethargic.  Objective:    Filed Vitals:   03/30/15 1016 03/30/15 1403 03/30/15 2036 03/31/15 0512  BP: 157/66 162/73 138/58 139/59  Pulse: 100 102 100 107  Temp: 97.9 F (36.6 C) 98.2 F (36.8 C) 98.3 F (36.8 C) 98 F (36.7 C)  TempSrc:  Oral Axillary Axillary Axillary  Resp: 18 18 18 18   Height:      Weight:      SpO2: 100% 100% 100% 100%    Intake/Output Summary (Last 24 hours) at 03/31/15 0757 Last data filed at 03/31/15 0702  Gross per 24 hour  Intake 1894.68 ml  Output   1750 ml  Net 144.68 ml    Exam: Gen:  NAD, weak Cardiovascular:  RRR, No M/R/G Respiratory:  Lungs CTAB Gastrointestinal:  Abdomen soft, tender, minimal BS Extremities:  1+ edema   Data Reviewed:    Labs: Basic Metabolic Panel:  Recent Labs Lab 03/25/15 0615 03/26/15 0510 03/27/15 0404 03/28/15 0357 03/29/15 0528 03/31/15 0350  NA 130* 130* 132* 134* 133* 136  K 5.0 4.3 4.1 2.8* 3.7 4.8  CL 99* 99* 99* 100* 104 102  CO2 19* 22 22 23 23 25   GLUCOSE 154* 143* 117* 153* 149* 176*  BUN 29* 41* 45* 49* 51* 48*  CREATININE 2.20* 2.01* 1.56* 1.28* 1.08* 0.98  CALCIUM 7.7* 7.7* 8.2* 8.1* 7.7* 8.2*  MG 1.6* 2.1 2.1 1.7 1.9 1.7  PHOS 3.4 2.8 1.7* 3.1  --  4.5   GFR Estimated Creatinine Clearance: 44 mL/min (by C-G formula based on Cr of 0.98). Liver Function Tests:  Recent Labs Lab 03/28/15 0357 03/29/15 0528 03/31/15 0350  AST 38 33 26  ALT 18 18 14   ALKPHOS 86 98 98  BILITOT 0.2* 0.4 0.4  PROT 5.6* 5.6* 5.9*  ALBUMIN 1.9* 1.7* 1.8*   Coagulation profile  Recent Labs Lab 03/27/15 2348  INR 1.06    CBC:  Recent Labs Lab 03/25/15 0615 03/26/15 0510 03/28/15 0357  WBC 24.8* 24.8* 15.4*  NEUTROABS 23.9*  --  14.3*  HGB 10.2* 8.3* 8.4*  HCT 31.7* 26.4* 24.6*  MCV 91.9 91.0 86.6  PLT 182 132* 131*   Cardiac Enzymes:  Recent Labs Lab 03/25/15 1315 03/25/15 2223 03/26/15 0510  TROPONINI 0.03 0.03 0.03   CBG:  Recent Labs Lab 03/30/15 1140 03/30/15 1717 03/30/15 2028 03/30/15 2340 03/31/15 0514  GLUCAP 130* 144* 120* 158* 174*   Sepsis Labs:  Recent Labs Lab 03/25/15 0615 03/25/15 0926 03/25/15 1315 03/26/15 0510 03/27/15 0404 03/28/15 0357  PROCALCITON  --  >175.00 147.10 80.81  42.12  --   WBC 24.8*  --   --  24.8*  --  15.4*  LATICACIDVEN  --  2.2* 2.3*  --   --   --    Microbiology Recent Results (from the past 240 hour(s))  MRSA PCR Screening     Status: None   Collection Time: 03/24/15  8:52 AM  Result Value Ref Range Status   MRSA by PCR NEGATIVE NEGATIVE Final    Comment:        The GeneXpert MRSA Assay (FDA approved for NASAL specimens only), is one component of a comprehensive MRSA colonization surveillance program. It is not intended to diagnose MRSA infection nor to guide or monitor treatment for MRSA infections.   Culture, blood (x 2)     Status: None   Collection Time: 03/25/15  9:30 AM  Result Value Ref Range Status   Specimen Description BLOOD RIGHT HAND  Final   Special Requests BOTTLES DRAWN AEROBIC ONLY 5CC  Final   Culture   Final    NO GROWTH 5 DAYS Performed at [facility]    Report Status 03/31/2015 FINAL  Final  Culture, blood (x 2)     Status: None   Collection Time: 03/25/15  9:35 AM  Result Value Ref Range Status   Specimen Description BLOOD RIGHT ARM  Final   Special Requests BOTTLES DRAWN AEROBIC AND ANAEROBIC 6CC  Final   Culture   Final    NO GROWTH 5 DAYS Performed at [facility]    Report Status 03/31/2015 FINAL  Final  Culture, Urine     Status: None   Collection Time: 03/25/15 11:57 AM  Result Value Ref Range Status   Specimen Description URINE, CATHETERIZED  Final   Special Requests NONE  Final   Colony Count NO GROWTH Performed at [facility]   Final   Culture NO GROWTH Performed at [facility]   Final   Report  Status 03/26/2015 FINAL  Final     Medications:   . antiseptic oral rinse  7 mL Mouth Rinse q12n4p  . aspirin  300 mg Rectal Daily   Or  . aspirin  325 mg Oral Daily  . chlorhexidine  15 mL Mouth Rinse BID  . insulin aspart  0-15 Units Subcutaneous 4 times per day  . lip balm  1 application Topical BID  . metoprolol  2.5 mg Intravenous 4 times per  day   Continuous Infusions: . sodium chloride 10 mL/hr (03/29/15 0800)  . Marland KitchenTPN (CLINIMIX-E) Adult 70 mL/hr at 03/30/15 1721   And  . fat emulsion 240 mL (03/30/15 1721)  . lactated ringers Stopped (03/28/15 0800)    Time spent: 25 minutes.   LOS: 13 days   Lynnsie Linders  [facility] Pager [phone]. If unable to reach me by pager, please call my cell phone at [phone].  *Please refer to [email or web address], password [email or web address] to get updated schedule on who will round on this patient, as hospitalists switch teams weekly. If 7PM-7AM, please contact night-coverage at [email or web address], password [email or web address] for any overnight needs.  03/31/2015, 7:57 AM

## 2015-03-31 NOTE — Progress Notes (Signed)
OT Cancellation Note  Patient Details Name: [name] MRN: [id number] DOB: [birth year]   Cancelled Treatment:    Reason Eval/Treat Not Completed: Other (comment).  Pt declined OT stating that she felt "lousy".  Notified RN.  Will check back another day.  Lyndal Reggio 03/31/2015, 2:19 PM  Marica Otter, OTR/L [phone] 03/31/2015

## 2015-03-31 NOTE — Progress Notes (Signed)
PARENTERAL NUTRITION CONSULT NOTE - follow up  Pharmacy Consult for TPN Indication: bowel obstruction  Allergies  Allergen Reactions  . Amlodipine Besylate     REACTION: dizzy  . Aspirin   . Atenolol     REACTION: fatigue  . Benazepril     cough  . Benicar [Olmesartan Medoxomil]     HA  . Cozaar     nausea  . Hydrochlorothiazide W-Triamterene     REACTION: dizzy  . Hydrocodone     REACTION: HA  . Hydroxyzine Pamoate   . Iodine   . Lisinopril     REACTION: tired, cough  . Penicillins Itching    tolerates cephalosporins OK  . Pravastatin     myalgias  . Prednisolone     My stomach hurts  . Tramadol Hcl     REACTION: HA    Patient Measurements: Height: 5\' 4"  (162.6 cm) Weight: 149 lb 4 oz (67.7 kg) IBW/kg (Calculated) : 54.7 Adjusted Body Weight: 57.5kg   Vital Signs: Temp: 98 F (36.7 C) (05/19 0512) Temp Source: Axillary (05/19 0512) BP: 139/59 mmHg (05/19 0512) Pulse Rate: 107 (05/19 0512) Intake/Output from previous day: 05/18 0701 - 05/19 0700 In: 1894.7 [NG/GT:30; TPN:1834.7] Out: 1450 [Urine:1450] Intake/Output from this shift: Total I/O In: -  Out: 300 [Urine:300]  Labs: No results for input(s): WBC, HGB, HCT, PLT, APTT, INR in the last 72 hours.   Recent Labs  03/29/15 0528 03/31/15 0350  NA 133* 136  K 3.7 4.8  CL 104 102  CO2 23 25  GLUCOSE 149* 176*  BUN 51* 48*  CREATININE 1.08* 0.98  CALCIUM 7.7* 8.2*  MG 1.9 1.7  PHOS  --  4.5  PROT 5.6* 5.9*  ALBUMIN 1.7* 1.8*  AST 33 26  ALT 18 14  ALKPHOS 98 98  BILITOT 0.4 0.4   Estimated Creatinine Clearance: 44 mL/min (by C-G formula based on Cr of 0.98).    Recent Labs  03/30/15 2028 03/30/15 2340 03/31/15 0514  GLUCAP 120* 158* 174*    Insulin Requirements: 10 units Novolog over 24 hours  Current Nutrition: NPO  IVF: NS @ 10 ml/hr, LR @ KVO  Central access: PICC 5/11 TPN start date: 5/11  ASSESSMENT                                                                                                           HPI: 81 year old female with history of hypertension, coronary artery disease, hyperlipidemia, iron deficiency anemia, depression, osteoporosis, mild chronic kidney disease presented to the ED with ongoing nausea for 4 days and diffuse crampy lower abdominal pain since one day prior to admission. Associated poor by mouth intake and generalized weakness. Patient was found to have small bowel obstruction.  Pharmacy consulted to start TPN.  Significant events:  5/12: laparoscopic lysis of adhesions with laparotomy and decompression of bowel 5/13: new AKI 5/15: Continue NG tube and NPO d/t bilious output pending BM.   5/19: Remove NGT, swallow eval before PO intake.  Today:   Glucose (goal <150) -  CBG range 120-174  Electrolytes - Na improved to WNL, Corr Ca 9.96 and other lytes WNL.  Mag 1.7 at low end of normal limits.  (Last Mag and KCl supplements on 5/17)  Renal - new AKI, but SCr now improving.  LFTs - WNL (5/19)  TGs - wnl (5/16)  Prealbumin: 2.7 (5/16)  NUTRITIONAL GOALS                                                                                             RD recs (5/18): 75-85 g/day protein, 1500-1700 Kcal/day. Clinimix E 5/15 at a goal rate of 16ml/hr + 20% fat emulsion at 24ml/hr to provide: 84 g/day protein, 1672 Kcal/day.  PLAN                                                                                                                         1) Magnesium sulfate 1gm IV x1 today 2) At 1800 today:  Continue to Clinimix E 5/15 at goal rate 70 ml/hr.  Continue 20% fat emulsion at 68ml/hr.  TPN to contain standard multivitamins and trace elements.  Maintain IVF at [facility]  Continue mod SSI q6h  TPN lab panels on Mondays & Thursdays.  F/u daily.   Lynann Beaver PharmD, BCPS Pager [phone] 03/31/2015 10:48 AM

## 2015-03-31 NOTE — Clinical Social Work Placement (Signed)
CSW met with patient's daughters - [name] & Vaughan Basta at bedside who are now agreeable with plan for SNF at discharge. Patient's husband, Percell Miller was just discharged from [facility] to [facility] 5/17 and patient's daughters would prefer that she go there at discharge. CSW explained that [facility] cannot accommodate TPN, though there are other facilities that could - daughters hope that patient could be transitioned off TPN prior to discharge. CSW sent referral to Todd at [facility] aware.     Raynaldo Opitz, [facility] Clinical Social Worker cell #: [phone]    CLINICAL SOCIAL WORK PLACEMENT  NOTE  Date:  03/31/2015  Patient Details  Name: [name] MRN: [id number] Date of Birth: Jun 04, 1933  Clinical Social Work is seeking post-discharge placement for this patient at the [place] level of care (*CSW will initial, date and re-position this form in  chart as items are completed):  Yes   Patient/family provided with [facility] Work Department's list of facilities offering this level of care within the geographic area requested by the patient (or if unable, by the patient's family).  Yes   Patient/family informed of their freedom to choose among providers that offer the needed level of care, that participate in Medicare, Medicaid or managed care program needed by the patient, have an available bed and are willing to accept the patient.  Yes   Patient/family informed of [facility]'s ownership interest in [facility] and [facility], as well as of the fact that they are under no obligation to receive care at these facilities.  PASRR submitted to EDS on 03/31/15     PASRR number received on 03/31/15     Existing PASRR number confirmed on       FL2 transmitted to all facilities in geographic area requested by pt/family on 03/31/15     FL2 transmitted to all facilities within larger geographic area on        Patient informed that his/her managed care company has contracts with or will negotiate with certain facilities, including the following:            Patient/family informed of bed offers received.  Patient chooses bed at       Physician recommends and patient chooses bed at      Patient to be transferred to   on  .  Patient to be transferred to facility by       Patient family notified on   of transfer.  Name of family member notified:        PHYSICIAN       Additional Comment:    _______________________________________________ Standley Brooking, LCSW 03/31/2015, 2:59 PM

## 2015-03-31 NOTE — Consult Note (Addendum)
   [facility] CM Inpatient Consult   03/31/2015  KRISANN MCKENNA [birth year] [id number]   Patient evaluated for [facility] Care Management services. Came to bedside to discuss [facility] Care Management with patient's daughter, Herma Mering. Discussed discharge plans and [name] reports she has changed her mind and she thinks her mother needs rehab for a short period of time due to her weakness instead of going home with her as she initially thought. Discussed that SNF is a good option at this point. Made her aware that writer will make inpatient Licensed CSW aware of the change of plan.   Explained [facility] Care Management services to [facility] and consents were obtained. Explained that [facility] case management and [facility] Care Management will follow up for post transition of care needs. Left [facility] Care Management packet at bedside along with contact information. Made inpatient RNCM aware of visit.   Of note, [name] Thompson's contact number is [phone].  Raiford Noble, MSN-Ed, RN,BSN [facility] Liaison [phone]

## 2015-03-31 NOTE — Progress Notes (Signed)
*  PRELIMINARY RESULTS* Vascular Ultrasound Carotid Duplex (Doppler) has been completed.  Findings suggest 1-39% internal carotid artery stenosis bilaterally. Vertebral arteries are patent with antegrade flow..  03/31/2015 2:18 PM Gertie Fey, RVT, RDCS, RDMS

## 2015-03-31 NOTE — Progress Notes (Signed)
Patient ID: [name], female   DOB: 1933/04/03, 81 y.o.   MRN: [id number] 7 Days Post-Op  Subjective: Still not very verbal.  Family in room, states C/O NG and abd pain. No apparent flatus or BM but no N/V.  Appears more comfortable than yesterday  Objective: Vital signs in last 24 hours: Temp:  [98 F (36.7 C)-98.3 F (36.8 C)] 98 F (36.7 C) (05/19 0512) Pulse Rate:  [100-107] 107 (05/19 0512) Resp:  [18] 18 (05/19 0512) BP: (138-162)/(58-73) 139/59 mmHg (05/19 0512) SpO2:  [100 %] 100 % (05/19 0512) Last BM Date: 03/23/15  Intake/Output from previous day: 05/18 0701 - 05/19 0700 In: 1894.7 [NG/GT:30; TPN:1834.7] Out: 1450 [Urine:1450] Intake/Output this shift: Total I/O In: -  Out: 300 [Urine:300]  General appearance: no distress and slowed mentation GI: abnormal findings:  mild tenderness in the entire abdomen and non distended Incision/Wound: Clean and dry  Lab Results:  No results for input(s): WBC, HGB, HCT, PLT in the last 72 hours. BMET  Recent Labs  03/29/15 0528 03/31/15 0350  NA 133* 136  K 3.7 4.8  CL 104 102  CO2 23 25  GLUCOSE 149* 176*  BUN 51* 48*  CREATININE 1.08* 0.98  CALCIUM 7.7* 8.2*     Studies/Results: No results found.  Anti-infectives: Anti-infectives    Start     Dose/Rate Route Frequency Ordered Stop   03/26/15 1600  cefTRIAXone (ROCEPHIN) 2 g in dextrose 5 % 50 mL IVPB - Premix    Comments:  Pharmacy may adjust dosing strength / duration / interval for maximal efficacy   2 g 100 mL/hr over 30 Minutes Intravenous Every 24 hours 03/26/15 1007 03/30/15 1535   03/26/15 1200  metroNIDAZOLE (FLAGYL) IVPB 500 mg     500 mg 100 mL/hr over 60 Minutes Intravenous Every 6 hours 03/26/15 1007 03/30/15 1850   03/25/15 1600  cefOXitin (MEFOXIN) 1 g in dextrose 5 % 50 mL IVPB  Status:  Discontinued     1 g 100 mL/hr over 30 Minutes Intravenous Every 12 hours 03/25/15 0801 03/26/15 1007   03/25/15 0800  cefOXitin (MEFOXIN) 1 g in  dextrose 5 % 50 mL IVPB  Status:  Discontinued     1 g 100 mL/hr over 30 Minutes Intravenous 3 times per day 03/25/15 0752 03/25/15 0758   03/24/15 1700  cefOXitin (MEFOXIN) 1 g in dextrose 5 % 50 mL IVPB     1 g 100 mL/hr over 30 Minutes Intravenous Every 6 hours 03/24/15 1511 03/25/15 0512   03/24/15 0915  cefOXitin (MEFOXIN) 2 g in dextrose 5 % 50 mL IVPB    Comments:  Pharmacy may adjust dosing strength, interval, or rate of medication as needed for optimal therapy for the patient Send with patient on call to the OR.  Anesthesia to complete antibiotic administration <43min prior to incision per [facility].   2 g 100 mL/hr over 30 Minutes Intravenous On call to O.R. 03/24/15 0910 03/24/15 1042      Assessment/Plan: s/p Procedure(s): LAPAROSCOPY DIAGNOSTIC LAPAROSCOPIC LYSIS OF ADHESIONS LAPAROTOMY with decompression of bowel ? Mild CVA, MRI neg D/C NGT Swallow eval before feeding   LOS: 13 days    Shaketa Serafin T 03/31/2015

## 2015-04-01 ENCOUNTER — Inpatient Hospital Stay (HOSPITAL_COMMUNITY): Payer: Commercial Managed Care - HMO

## 2015-04-01 LAB — BASIC METABOLIC PANEL
ANION GAP: 7 (ref 5–15)
BUN: 53 mg/dL — ABNORMAL HIGH (ref 6–20)
CHLORIDE: 104 mmol/L (ref 101–111)
CO2: 24 mmol/L (ref 22–32)
CREATININE: 0.89 mg/dL (ref 0.44–1.00)
Calcium: 8 mg/dL — ABNORMAL LOW (ref 8.9–10.3)
GFR calc non Af Amer: >60 mL/min (ref >60)
Glucose, Bld: 185 mg/dL — ABNORMAL HIGH (ref 65–99)
POTASSIUM: 5.1 mmol/L (ref 3.5–5.1)
SODIUM: 135 mmol/L (ref 135–145)

## 2015-04-01 LAB — GLUCOSE, CAPILLARY
GLUCOSE-CAPILLARY: 132 mg/dL — AB (ref 65–99)
Glucose-Capillary: 138 mg/dL — ABNORMAL HIGH (ref 65–99)
Glucose-Capillary: 166 mg/dL — ABNORMAL HIGH (ref 65–99)
Glucose-Capillary: 181 mg/dL — ABNORMAL HIGH (ref 65–99)
Glucose-Capillary: 216 mg/dL — ABNORMAL HIGH (ref 65–99)

## 2015-04-01 LAB — PHOSPHORUS: Phosphorus: 4.7 mg/dL — ABNORMAL HIGH (ref 2.5–4.6)

## 2015-04-01 LAB — CBC
HEMATOCRIT: 19.5 % — AB (ref 36.0–46.0)
Hemoglobin: 6.4 g/dL — CL (ref 12.0–15.0)
MCH: 29.5 pg (ref 26.0–34.0)
MCHC: 32.8 g/dL (ref 30.0–36.0)
MCV: 89.9 fL (ref 78.0–100.0)
Platelets: 297 10*3/uL (ref 150–400)
RBC: 2.17 MIL/uL — AB (ref 3.87–5.11)
RDW: 13.6 % (ref 11.5–15.5)
WBC: 14.8 10*3/uL — AB (ref 4.0–10.5)

## 2015-04-01 LAB — MAGNESIUM: Magnesium: 1.8 mg/dL (ref 1.7–2.4)

## 2015-04-01 LAB — PREPARE RBC (CROSSMATCH)

## 2015-04-01 MED ORDER — BISACODYL 10 MG RE SUPP
10.0000 mg | Freq: Every day | RECTAL | Status: DC
  Administered 2015-04-01 – 2015-04-20 (×15): 10 mg via RECTAL
  Filled 2015-04-01 (×16): qty 1

## 2015-04-01 MED ORDER — ALTEPLASE 2 MG IJ SOLR
2.0000 mg | Freq: Once | INTRAMUSCULAR | Status: AC
  Administered 2015-04-01: 2 mg
  Filled 2015-04-01: qty 2

## 2015-04-01 MED ORDER — FAT EMULSION 20 % IV EMUL
240.0000 mL | INTRAVENOUS | Status: AC
  Administered 2015-04-01: 240 mL via INTRAVENOUS
  Filled 2015-04-01: qty 250

## 2015-04-01 MED ORDER — OXYCODONE HCL 5 MG PO TABS
5.0000 mg | ORAL_TABLET | ORAL | Status: DC | PRN
  Administered 2015-04-01 – 2015-04-10 (×33): 5 mg via ORAL
  Filled 2015-04-01 (×33): qty 1

## 2015-04-01 MED ORDER — ENSURE ENLIVE PO LIQD
237.0000 mL | Freq: Two times a day (BID) | ORAL | Status: DC
  Administered 2015-04-02 – 2015-04-20 (×18): 237 mL via ORAL

## 2015-04-01 MED ORDER — TRACE MINERALS CR-CU-MN-SE-ZN 10-1000-500-60 MCG/ML IV SOLN
INTRAVENOUS | Status: DC
  Administered 2015-04-01: 18:00:00 via INTRAVENOUS
  Filled 2015-04-01: qty 1680

## 2015-04-01 MED ORDER — SODIUM CHLORIDE 0.9 % IV SOLN
Freq: Once | INTRAVENOUS | Status: AC
  Administered 2015-04-01: 19:00:00 via INTRAVENOUS

## 2015-04-01 NOTE — Care Management Note (Signed)
Case Management Note  Patient Details  Name: [name] MRN: [id number] Date of Birth: 27-Oct-1933  Subjective/Objective:                    Action/Plan:   Expected Discharge Date:   (unknown)               Expected Discharge Plan:  Home w Home Health Services  In-House Referral:     Discharge planning Services  CM Consult  Post Acute Care Choice:  Home Health Choice offered to:  Adult Children  DME Arranged:    DME Agency:     HH Arranged:    HH Agency:     Status of Service:  In process, will continue to follow  Medicare Important Message Given:  Yes Date Medicare IM Given:  03/21/15 Medicare IM give by:  Aileen Pilot Date Additional Medicare IM Given:    Additional Medicare Important Message give by:     If discussed at Long Length of Stay Meetings, dates discussed:    Additional Comments: 04/01/15 Spoke with Dr. Jacky Kindle concerning continuous stay.  Joya Salm, Orland Visconti, RN 04/01/2015, 3:40 PM

## 2015-04-01 NOTE — Progress Notes (Signed)
This CM with Tresa Endo CSW,  spoke with pt's two daughters at bedside concerning HH vs SNF. Daughters selected for [employer] for pt.  Plans are to discharge to SNF. Thanks

## 2015-04-01 NOTE — Progress Notes (Signed)
CRITICAL VALUE ALERT  Critical value received:  Hgb- 6.4  Date of notification:  04/01/2015   Time of notification:  6:25 AM   Critical value read back:Yes.    Nurse who received alert:  Thersa Salt  MD notified (1st page):  [facility]  Time of first page:  6:25 AM

## 2015-04-01 NOTE — Progress Notes (Signed)
MBSS complete. Full report located under chart review in imaging section. Kaila Devries, MA CCC-SLP [phone]  

## 2015-04-01 NOTE — Progress Notes (Signed)
[facility] Progress Note  8 Days Post-Op  Subjective: Pt doing better.  Very alert.  Says she burping a lot, but having some flatus.  Daughter at bedside says she's doing better.  Not much pain, no N/V.  Happy that NG is out.    Objective: Vital signs in last 24 hours: Temp:  [98.1 F (36.7 C)-100 F (37.8 C)] 98.8 F (37.1 C) (05/20 0513) Pulse Rate:  [93-110] 93 (05/20 0513) Resp:  [18] 18 (05/20 0136) BP: (140-155)/(59-69) 145/59 mmHg (05/20 0513) SpO2:  [100 %] 100 % (05/20 0513) Last BM Date: 03/23/15  Intake/Output from previous day: 05/19 0701 - 05/20 0700 In: 3585.3 [I.V.:600; IV Piggyback:100; TPN:2885.3] Out: 2375 [Urine:2375] Intake/Output this shift:    PE: Gen:  Alert, NAD, pleasant Abd: Soft, NT/ND, +BS, no HSM, incisions C/D/I with staples in place over midline incision, few lap staple sites and drain removed   Lab Results:   Recent Labs  04/01/15 0450  WBC 14.8*  HGB 6.4*  HCT 19.5*  PLT 297   BMET  Recent Labs  03/31/15 0350 04/01/15 0450  NA 136 135  K 4.8 5.1  CL 102 104  CO2 25 24  GLUCOSE 176* 185*  BUN 48* 53*  CREATININE 0.98 0.89  CALCIUM 8.2* 8.0*   PT/INR No results for input(s): LABPROT, INR in the last 72 hours. CMP     Component Value Date/Time   NA 135 04/01/2015 0450   K 5.1 04/01/2015 0450   CL 104 04/01/2015 0450   CO2 24 04/01/2015 0450   GLUCOSE 185* 04/01/2015 0450   BUN 53* 04/01/2015 0450   CREATININE 0.89 04/01/2015 0450   CALCIUM 8.0* 04/01/2015 0450   PROT 5.9* 03/31/2015 0350   ALBUMIN 1.8* 03/31/2015 0350   AST 26 03/31/2015 0350   ALT 14 03/31/2015 0350   ALKPHOS 98 03/31/2015 0350   BILITOT 0.4 03/31/2015 0350   GFRNONAA >60 04/01/2015 0450   GFRAA >60 04/01/2015 0450   Lipase     Component Value Date/Time   LIPASE 30 03/18/2015 0714       Studies/Results: No results found.  Anti-infectives: Anti-infectives    Start     Dose/Rate Route Frequency Ordered Stop    03/26/15 1600  cefTRIAXone (ROCEPHIN) 2 g in dextrose 5 % 50 mL IVPB - Premix    Comments:  Pharmacy may adjust dosing strength / duration / interval for maximal efficacy   2 g 100 mL/hr over 30 Minutes Intravenous Every 24 hours 03/26/15 1007 03/30/15 1535   03/26/15 1200  metroNIDAZOLE (FLAGYL) IVPB 500 mg     500 mg 100 mL/hr over 60 Minutes Intravenous Every 6 hours 03/26/15 1007 03/30/15 1850   03/25/15 1600  cefOXitin (MEFOXIN) 1 g in dextrose 5 % 50 mL IVPB  Status:  Discontinued     1 g 100 mL/hr over 30 Minutes Intravenous Every 12 hours 03/25/15 0801 03/26/15 1007   03/25/15 0800  cefOXitin (MEFOXIN) 1 g in dextrose 5 % 50 mL IVPB  Status:  Discontinued     1 g 100 mL/hr over 30 Minutes Intravenous 3 times per day 03/25/15 0752 03/25/15 0758   03/24/15 1700  cefOXitin (MEFOXIN) 1 g in dextrose 5 % 50 mL IVPB     1 g 100 mL/hr over 30 Minutes Intravenous Every 6 hours 03/24/15 1511 03/25/15 0512   03/24/15 0915  cefOXitin (MEFOXIN) 2 g in dextrose 5 % 50 mL IVPB    Comments:  Pharmacy  may adjust dosing strength, interval, or rate of medication as needed for optimal therapy for the patient Send with patient on call to the OR.  Anesthesia to complete antibiotic administration <15min prior to incision per [facility].   2 g 100 mL/hr over 30 Minutes Intravenous On call to O.R. 03/24/15 0910 03/24/15 1042       Assessment/Plan POD #7, s/p lap converted to open LOA for SBO, Dr. Daphine Deutscher -NG removed swallow study done, pending result -Swallow eval pending diet recommendation, would allow clears if passes eval, advance diet slowly secondary to ileus -Mobilize as able s/p CVA 03-27-15 -IV pain meds prn for pain control, limit narcotics if possible -Cont TNA for nutritional support, can wean when we start taking a diet.  If not improving may need PANDA for tube feeds. -Abx: Rocephin/Flagyl D5/5 now discontinued -Pulmonary toilet -Drain has been removed  03/27/15 Leukocytosis -14.8 ABL Anemia -Hgb 6.4 down from 8.4 on 03/27/15, pending blood administration per primary service ?CVA -per neurology and primary   DVT prophylaxis SCDs, heparin held for thrombocytopenia per primary service    LOS: 14 days    [name], [name] 04/01/2015, 9:10 AM Pager: [phone]

## 2015-04-01 NOTE — Progress Notes (Signed)
Progress Note   [name] BJY:[id number] DOB: [birth year] DOA: 03/18/2015 PCP: [name], MD   Brief Narrative:   [name] is an 81 y.o. female with a PMH of hypertension, CAD, hyperlipidemia and diabetes who was admitted 03/18/15 with SBO secondary to adhesions. She failed conservative therapy and underwent laparoscopic lysis of adhesions/laparotomy with bowel decompression on 03/24/15. Her hospital course was complicated by sepsis secondary to peritoneal contamination with transfer to the SDU on 03/25/15. She also developed left-sided facial droop/right arm weakness 03/27/15 with CT scan showing a 4 mm lacunar infarct in the left lentiform nucleus. MRI failed to show any abnormality. Neurology is following. Barrier to discharge his ongoing need for TPN for nutritional purposes.  Assessment/Plan:   Principal Problem:   Small bowel obstruction s/p exlap/LOA/decompression 03/25/2015 complicated by severe sepsis secondary to peritonitis / abdominal pain - Status post laparoscopic lysis of adhesions/laparotomy with bowel decompression 03/24/15. - Subsequently developed acute peritonitis/sepsis. - Initially treated with Mefoxin starting 03/25/15. Antibiotics switched to Flagyl/Rocephin 03/26/15. - Transferred out of the SDU 03/29/15 given her clinical stability. - Has tolerated discontinuation of NG tube, diet advanced to dysphagia 3 today.  Active Problems:   Facial droop / right arm weakness / possible acute CVA left lentiform nucleus - On 03/27/15, CT of the head done to evaluate symptoms showed an acute 4 mm lacunar infarct in the left lentiform nucleus. - Subsequent MRI negative, although clinical findings consistent with acute CVA. MRA negative for stenosis. - 2-D echo with normal EF, no diastolic dysfunction noted. No intracardiac masses or thrombi. - Carotid Dopplers negative for significant stenosis (1-39% bilaterally). - Hemoglobin A1c 6.3%. Cholesterol 99. - Evaluated by  neurology, no further stroke workup recommended. - Continue aspirin. - PT/OT.    Hypokalemia/hypomagnesemia - Monitor and replace electrolytes as needed.    Acute postoperative blood loss anemia - Initial hemoglobin 10.2, has dropped approximately 2 g postoperatively. - Hemoglobin 6.4 on recheck today, we'll give 2 units of PRBCs.    Diabetes type 2, controlled - Currently being managed with moderate scale SSI every 4 hours. CBGs 132-181. - Hemoglobin A1c 6.3%.    Anxiety state/Adjustment disorder with mixed anxiety and depressed mood - Monitor.    Coronary atherosclerosis - Continue aspirin therapy.    Acute renal failure  - Secondary to sepsis. Creatinine steadily improving.    Right foot pain  - Radiographs negative for fracture. Degenerative changes noted.    Polymyalgia rheumatica/Chronic fatigue disorder - PT/OT.    Elevated blood pressure - Continue as needed Lopressor.    Malnutrition of moderate degree - Continue TPN for nutritional support.    Acute delirium - Avoid sedating medications. Family requests patient does not get Ativan or Haldol.    Thrombocytopenia - Mild. Likely related to recent sepsis.    DVT Prophylaxis - Continue SCDs.  Code Status: Full. Family Communication: Daughter, at the bedside.   Disposition Plan: Home with daughter when diet advanced, difficult to predict due to long hospitalization, ongoing need for IV pain medication, possibly 04/04/15.   IV Access:    PICC placed 03/23/15   Procedures and diagnostic studies:   Ct Head Wo Contrast  03/27/2015   CLINICAL DATA:  RIGHT-sided facial droop. Symptoms began earlier today. Altered mental status.  EXAM: CT HEAD WITHOUT CONTRAST  TECHNIQUE: Contiguous axial images were obtained from the base of the skull through the vertex without intravenous contrast.  COMPARISON:  05/27/2014.  FINDINGS: Possible acute 4 mm  lacune, LEFT lentiform nucleus as seen on image 12 series 5. This was not  clearly present on in 2015 scan.  No other areas of concern for cortical or subcortical infarction.  No hemorrhage, mass lesion, hydrocephalus, or extra-axial fluid.  Cerebral and cerebellar atrophy. Generalized white matter hypoattenuation, likely small vessel disease. Than the calvarium intact. Vascular calcification. No sinus or mastoid disease. Dense lenticular opacities.  IMPRESSION: Possible acute 4 mm lacunar infarct LEFT lentiform nucleus. If further investigation desired, and no contraindications, consider MRI brain.   Electronically Signed   By: Davonna Belling M.D.   On: 03/27/2015 16:25   Mr Maxine Glenn Head Wo Contrast  03/28/2015   CLINICAL DATA:  81 year old with right-sided facial droop. Altered mental status. Subsequent encounter.  EXAM: MRI HEAD WITHOUT CONTRAST  MRA HEAD WITHOUT CONTRAST  TECHNIQUE: Multiplanar, multiecho pulse sequences of the brain and surrounding structures were obtained without intravenous contrast. Angiographic images of the head were obtained using MRA technique without contrast.  COMPARISON:  03/27/2015 head CT.  FINDINGS: MRI HEAD FINDINGS  No acute infarct.  No intracranial hemorrhage.  Remote tiny infarct right cerebellum. Minimal small vessel disease type changes.  Incidentally noted is a prominent peri vascular space left lenticular nucleus.  Global atrophy without hydrocephalus.  No intracranial mass lesion noted on this unenhanced exam.  Cervical medullary junction unremarkable. Mild spinal stenosis C3-4.  Expanded partially empty sella without other findings of pseudotumor cerebri  Pineal region unremarkable.  No intracranial mass lesion noted on this unenhanced exam.  MRA HEAD FINDINGS  Mild narrowing pre cavernous/ cavernous segment right internal carotid artery.  Mild ectasia left internal carotid artery cavernous segment. The minimal bulge along the left internal carotid artery cavernous segment most likely related to slight ectasia of the left ophthalmic artery origin  rather than small aneurysm. Mild motion degradation limits evaluation.  Slightly ectatic anterior communicating artery without aneurysm.  Middle cerebral artery mild branch vessel irregularity bilaterally.  No significant stenosis distal vertebral arteries or basilar artery. Regions of mild irregularity and slight narrowing.  IMPRESSION: MRI HEAD  No acute infarct.  Remote tiny infarct right cerebellum.  Incidentally noted is a prominent peri vascular space left lenticular nucleus.  Global atrophy without hydrocephalus.  Expanded partially empty sella without other findings of pseudotumor cerebri  MRA HEAD FINDINGS  No medium or large size vessel significant stenosis or occlusion. Evaluation of branch vessels slightly limited by motion degradation.   Electronically Signed   By: Lacy Duverney M.D.   On: 03/28/2015 12:02   Mr Brain Wo Contrast  03/28/2015   CLINICAL DATA:  81 year old with right-sided facial droop. Altered mental status. Subsequent encounter.  EXAM: MRI HEAD WITHOUT CONTRAST  MRA HEAD WITHOUT CONTRAST  TECHNIQUE: Multiplanar, multiecho pulse sequences of the brain and surrounding structures were obtained without intravenous contrast. Angiographic images of the head were obtained using MRA technique without contrast.  COMPARISON:  03/27/2015 head CT.  FINDINGS: MRI HEAD FINDINGS  No acute infarct.  No intracranial hemorrhage.  Remote tiny infarct right cerebellum. Minimal small vessel disease type changes.  Incidentally noted is a prominent peri vascular space left lenticular nucleus.  Global atrophy without hydrocephalus.  No intracranial mass lesion noted on this unenhanced exam.  Cervical medullary junction unremarkable. Mild spinal stenosis C3-4.  Expanded partially empty sella without other findings of pseudotumor cerebri  Pineal region unremarkable.  No intracranial mass lesion noted on this unenhanced exam.  MRA HEAD FINDINGS  Mild narrowing pre cavernous/ cavernous segment  right internal  carotid artery.  Mild ectasia left internal carotid artery cavernous segment. The minimal bulge along the left internal carotid artery cavernous segment most likely related to slight ectasia of the left ophthalmic artery origin rather than small aneurysm. Mild motion degradation limits evaluation.  Slightly ectatic anterior communicating artery without aneurysm.  Middle cerebral artery mild branch vessel irregularity bilaterally.  No significant stenosis distal vertebral arteries or basilar artery. Regions of mild irregularity and slight narrowing.  IMPRESSION: MRI HEAD  No acute infarct.  Remote tiny infarct right cerebellum.  Incidentally noted is a prominent peri vascular space left lenticular nucleus.  Global atrophy without hydrocephalus.  Expanded partially empty sella without other findings of pseudotumor cerebri  MRA HEAD FINDINGS  No medium or large size vessel significant stenosis or occlusion. Evaluation of branch vessels slightly limited by motion degradation.   Electronically Signed   By: Lacy Duverney M.D.   On: 03/28/2015 12:02   Ct Abdomen Pelvis W Contrast  03/18/2015   CLINICAL DATA:  Nausea and vomiting for 4 days, anxiety, status post hysterectomy CT pelvis 05/27/2014  EXAM: CT ABDOMEN AND PELVIS WITH CONTRAST  TECHNIQUE: Multidetector CT imaging of the abdomen and pelvis was performed using the standard protocol following bolus administration of intravenous contrast.  CONTRAST:  50mL OMNIPAQUE IOHEXOL 300 MG/ML SOLN, OMNIPAQUE IOHEXOL 300 MG/ML SOLN  COMPARISON:  None.  FINDINGS: Sagittal images of the spine shows diffuse osteopenia. Degenerative changes thoracolumbar spine. The lung bases are unremarkable.  The patient is status post cholecystectomy. Mild intrahepatic biliary ductal dilatation. There is CBD dilatation up to 1.1 cm. No focal hepatic mass. The pancreas, spleen and adrenal glands are unremarkable. Kidneys are symmetrical in size and enhancement. No hydronephrosis or  hydroureter. Delayed renal images shows bilateral renal symmetrical excretion.  Atherosclerotic calcifications of abdominal aorta and iliac arteries. Moderate stool noted in right colon and proximal transverse colon. No colonic obstruction. The descending colon and sigmoid colon are empty collapsed. Some stool noted within rectum.  There are fluid distended small bowel loops in mid upper abdomen and left lower abdomen and pelvis. Small amount of fluid/stranding noted a adjacent to small bowel loops in left lower quadrant see axial image 57. Distal small bowel is decreased caliber collapsed. Findings are consistent with small bowel obstruction.  Mild distended urinary bladder. The patient is status post hysterectomy. The terminal ileum is small caliber decompressed. Contrast material is noted within stomach. No any contrast material is noted within small bowel or colon.  IMPRESSION: 1. There are fluid distended small bowel loops with multiple air-fluid levels highly suspicious for small bowel obstruction. Distal small bowel is small caliber decompressed. Terminal ileum is small caliber. Small amount of fluid/stranding noted adjacent to small bowel loops in left lower quadrant please see images 49 and 57. 2. Moderate stool noted in right colon and proximal transverse colon. Descending colon and sigmoid colon are empty collapsed. 3. No pericecal inflammation. 4. No hydronephrosis or hydroureter. 5. Status post cholecystectomy. Mild intrahepatic biliary ductal dilatation. CBD dilatation up to 1.1 cm. 6. No hydronephrosis or hydroureter. 7. Status post hysterectomy. These results were called by telephone at the time of interpretation on 03/18/2015 at 8:59 am to Dr. Gerhard Munch , who verbally acknowledged these results.   Electronically Signed   By: Natasha Mead M.D.   On: 03/18/2015 08:59   US Renal  03/25/2015   CLINICAL DATA:  Acute renal failure.  EXAM: RENAL / URINARY TRACT ULTRASOUND COMPLETE  COMPARISON:  None.   FINDINGS: Right Kidney:  Length: 8.1 cm. Increased echogenicity consistent with chronic medical renal disease. No mass or hydronephrosis visualized.  Left Kidney:  Length: 9.3 cm. Increased echogenicity consistent chronic medical renal disease. No mass or hydronephrosis visualized.  Bladder:  Bladder decompressed by Foley catheter.  IMPRESSION: Bilateral echodense kidneys consistent chronic medical renal disease. No acute abnormality. No hydronephrosis or bladder distention.   Electronically Signed   By: Maisie Fus  Register   On: 03/25/2015 13:27   Dg Chest Port 1 View  03/27/2015   CLINICAL DATA:  Left PICC placement. Nausea and vomiting. Initial encounter.  EXAM: PORTABLE CHEST - 1 VIEW  COMPARISON:  Chest radiograph performed 03/26/2015  FINDINGS: The patient's left PICC is noted ending about the proximal SVC. An enteric tube is noted extending below the diaphragm.  The lungs are hypoexpanded. Bibasilar and right upper lung zone airspace opacities may reflect atelectasis or pneumonia. No pleural effusion or pneumothorax is seen  The cardiomediastinal silhouette is borderline normal in size. No acute osseous abnormalities are identified.  IMPRESSION: 1. Left PICC noted ending about the proximal SVC. 2. Lungs hypoexpanded. Bibasilar and right upper lung zone airspace opacities may reflect atelectasis or pneumonia.  These results were called by telephone at the time of interpretation on 03/27/2015 at 7:33 pm to Nursing in the [facility], who verbally acknowledged these results.   Electronically Signed   By: Roanna Raider M.D.   On: 03/27/2015 19:34   Dg Chest Port 1 View  03/26/2015   CLINICAL DATA:  Atelectasis  EXAM: PORTABLE CHEST - 1 VIEW  COMPARISON:  Yesterday  FINDINGS: NG tube and left PICC are stable. Upper normal heart size. Low volumes. Bibasilar atelectasis is not significantly changed. No pneumothorax.  IMPRESSION: Stable bibasilar atelectasis.   Electronically Signed   By: Jolaine Click  M.D.   On: 03/26/2015 08:14   Dg Chest Port 1 View  03/25/2015   CLINICAL DATA:  Tachycardia and hypotension; concern for sepsis  EXAM: PORTABLE CHEST - 1 VIEW  COMPARISON:  May 27, 2014  FINDINGS: There is patchy atelectasis in both lung bases. The degree of inspiration is shallow. There is no frank airspace consolidation. Heart is upper normal in size with pulmonary vascularity within normal limits.  Central catheter tip is in the superior vena cava. Nasogastric tube tip and side port are in the distal stomach. No pneumothorax.  IMPRESSION: Bibasilar atelectasis. No pneumothorax. Degree of inspiration shallow.   Electronically Signed   By: Bretta Bang III M.D.   On: 03/25/2015 09:03   Dg Abd Portable 1v  03/24/2015   CLINICAL DATA:  Small-bowel obstruction  EXAM: PORTABLE ABDOMEN - 1 VIEW  COMPARISON:  03/23/2015  FINDINGS: NG tube is been placed with the tip in the gastric antrum  Small bowel dilatation shows mild interval improvement. Colon is decompressed with moderate stool in the right colon. Surgical clips in the gallbladder fossa.  IMPRESSION: Mild improvement in small bowel obstruction. NG tube in the gastric antrum.   Electronically Signed   By: Marlan Palau M.D.   On: 03/24/2015 08:25   Dg Abd Portable 1v  03/23/2015   CLINICAL DATA:  Followup small bowel obstruction.  EXAM: PORTABLE ABDOMEN - 1 VIEW  COMPARISON:  03/22/2015  FINDINGS: Dilated loops small bowel are similar to the prior exam allowing for differences in radiographic technique different degrees of magnification.  There is a small amount of air in a nondistended colon.  IMPRESSION: 1. Persistent high-grade partial small bowel obstruction. No significant change from the previous day's study.   Electronically Signed   By: Amie Portland M.D.   On: 03/23/2015 10:01   Dg Abd Portable 1v  03/22/2015   CLINICAL DATA:  81 year old female with recent small bowel obstruction. Initial encounter.  EXAM: PORTABLE ABDOMEN - 1 VIEW   COMPARISON:  03/21/2015 and earlier, including CT Abdomen and Pelvis 03/18/2015.  FINDINGS: Portable AP supine view at 0517 hrs. Enteric tube has been removed. Recurrent dilated gas-filled small bowel loops in the mid abdomen up to 41 mm diameter. Abundant stool now at the splenic flexure. Decreased distal colon gas in the pelvis. Stable cholecystectomy clips. Stable abdominal and pelvic visceral contours. Grossly stable lung bases. Stable visualized osseous structures.  IMPRESSION: NG tube removed with recurrent small bowel obstruction.   Electronically Signed   By: Odessa Fleming M.D.   On: 03/22/2015 07:42   Dg Abd Portable 1v  03/21/2015   CLINICAL DATA:  Small-bowel obstruction .  EXAM: PORTABLE ABDOMEN - 1 VIEW  COMPARISON:  03/20/2015 .  FINDINGS: NG tube noted with tip in the upper portion stomach. Slight advancement should be considered . Surgical clips right upper quadrant Soft tissue structures are unremarkable. Small-bowel distention has improved. No free air. Stool noted throughout the colon. No acute bony abnormality.  IMPRESSION: 1. NG tube noted with tip in the upper portion of the stomach. Advancement suggested.  2. Interim improvement of small bowel distention. Colonic gas pattern is normal with stool throughout the colon.  These results will be called to the ordering clinician or representative by the Radiologist Assistant, and communication documented in the PACS or zVision Dashboard.   Electronically Signed   By: Maisie Fus  Register   On: 03/21/2015 07:32   Dg Abd Portable 1v  03/20/2015   CLINICAL DATA:  Followup small bowel obstruction.  EXAM: PORTABLE ABDOMEN - 1 VIEW  COMPARISON:  03/19/2015  FINDINGS: Mild small bowel dilation persists most evident in the left upper quadrant. There has been no substantial change from the prior study. Residual contrast is noted in the bladder, also similar to the prior exam. Nasogastric tube tip projects in the distal stomach.  IMPRESSION: Small bowel dilation  persists consistent with a persistent partial small bowel obstruction. No change from the previous day's study.   Electronically Signed   By: Amie Portland M.D.   On: 03/20/2015 07:29   Dg Abd Portable 1v  03/19/2015   CLINICAL DATA:  Small bowel obstruction protocol. Small bowel obstruction.  EXAM: PORTABLE ABDOMEN - 1 VIEW  COMPARISON:  03/18/2015.  FINDINGS: This is a 24 hr film. There is no contrast identified in the cecum. On the prior exam at 2046 hr yesterday, oral contrast was present in the stomach. Excreted contrast is present within the urinary bladder. Dilation of small bowel loops measures 36 mm. Cholecystectomy clips are present in the right upper quadrant.  There is less small bowel contrast than expected which may be due to dilution in the proximal small bowel. Nasogastric tube remains in the stomach with the tip at the antrum.  Large amount of stool is present in the colon.  IMPRESSION: Small bowel obstruction with no contrast identified in the cecum at 24 hr.   Electronically Signed   By: Andreas Newport M.D.   On: 03/19/2015 17:11   Dg Abd Portable 1v-small Bowel Obstruction Protocol-initial, 8 Hr Delay  03/19/2015   CLINICAL DATA:  Small bowel obstruction  EXAM: PORTABLE ABDOMEN - 1 VIEW  COMPARISON:  Radiographs and CT 03/18/2015  FINDINGS: Nasogastric tube extends into the distal stomach. There is contrast in the gastric lumen. There is no significant contrast in the small bowel or colon. Dilated stacked loops of small bowel persist in the mid abdomen, probably unchanged. No free air is evident on this single AP supine portable radiograph.  IMPRESSION: Enteric contrast has not reached the colon. Persistent small bowel dilatation consistent with small-bowel obstruction.   Electronically Signed   By: Ellery Plunk M.D.   On: 03/19/2015 01:48   Dg Abd Portable 1v-small Bowel Protocol-position Verification  03/18/2015   CLINICAL DATA:  Small bowel protocol, nasogastric tube placement   EXAM: PORTABLE ABDOMEN - 1 VIEW  COMPARISON:  Portable exam 1303 hours compared to CT abdomen and pelvis 03/18/2015  FINDINGS: Tip of nasogastric tube projects over distal gastric antrum or duodenal bulb, patient slightly rotated to the RIGHT.  Excreted contrast material within renal collecting systems and distended urinary bladder.  Air-filled loops of dilated small bowel noted in the mid abdomen suspicious for small bowel obstruction.  Some gas and stool remain present within the colon.  Degenerative disc and facet disease changes thoracolumbar spine.  Osseous demineralization.  IMPRESSION: Tip of nasogastric tube projects over either the distal gastric antrum or the duodenal bulb region.  Dilated small bowel loops question small bowel obstruction.   Electronically Signed   By: Ulyses Southward M.D.   On: 03/18/2015 13:16   Dg Foot 2 Views Right  03/22/2015   CLINICAL DATA:  Dorsal right foot pain for 4 days, no known injury, initial encounter  EXAM: RIGHT FOOT - 2 VIEW  COMPARISON:  None.  FINDINGS: Degenerative changes are noted in the tarsal bones. No acute fracture or dislocation is noted. A small calcaneal spur is noted.  IMPRESSION: Degenerative change without acute abnormality.   Electronically Signed   By: Alcide Clever M.D.   On: 03/22/2015 14:57     Medical Consultants:    Surgery - Dr. Glenna Fellows   Cardiology   PCCM  Neurology - Dr. Thana Farr   Anti-Infectives:    Mefoxin 03/25/2015 --> 03/26/2015  Rocephin 03/26/2015 -->   Flagyl 03/26/2015 -->  Subjective:   [name] is more awake and alert today. She says she has eaten a bit, and denies nausea. Feels like she needs to move her bowels.  Objective:    Filed Vitals:   03/31/15 1300 03/31/15 2316 04/01/15 0136 04/01/15 0513  BP:  155/69 143/65 145/59  Pulse:  105 99 93  Temp: 98.1 F (36.7 C) 100 F (37.8 C) 98.8 F (37.1 C) 98.8 F (37.1 C)  TempSrc: Axillary Oral Oral Oral  Resp:  18 18     Height:      Weight:      SpO2:  100% 100% 100%    Intake/Output Summary (Last 24 hours) at 04/01/15 0927 Last data filed at 04/01/15 0600  Gross per 24 hour  Intake 3585.32 ml  Output   2075 ml  Net 1510.32 ml    Exam: Gen:  NAD Cardiovascular:  RRR, No M/R/G Respiratory:  Lungs CTAB Gastrointestinal:  Abdomen soft, tender, + BS Extremities:  1+ edema   Data Reviewed:    Labs: Basic Metabolic Panel:  Recent Labs Lab 03/26/15 0510 03/27/15 0404 03/28/15 0357 03/29/15 0528 03/31/15 0350 04/01/15 0450  NA 130* 132* 134* 133* 136 135  K 4.3 4.1 2.8* 3.7 4.8 5.1  CL 99* 99* 100* 104 102 104  CO2 22 22 23 23 25 24   GLUCOSE 143* 117* 153* 149* 176* 185*  BUN 41* 45* 49* 51* 48* 53*  CREATININE 2.01* 1.56* 1.28* 1.08* 0.98 0.89  CALCIUM 7.7* 8.2* 8.1* 7.7* 8.2* 8.0*  MG 2.1 2.1 1.7 1.9 1.7 1.8  PHOS 2.8 1.7* 3.1  --  4.5 4.7*   GFR Estimated Creatinine Clearance: 48.5 mL/min (by C-G formula based on Cr of 0.89). Liver Function Tests:  Recent Labs Lab 03/28/15 0357 03/29/15 0528 03/31/15 0350  AST 38 33 26  ALT 18 18 14   ALKPHOS 86 98 98  BILITOT 0.2* 0.4 0.4  PROT 5.6* 5.6* 5.9*  ALBUMIN 1.9* 1.7* 1.8*   Coagulation profile  Recent Labs Lab 03/27/15 2348  INR 1.06    CBC:  Recent Labs Lab 03/26/15 0510 03/28/15 0357 04/01/15 0450  WBC 24.8* 15.4* 14.8*  NEUTROABS  --  14.3*  --   HGB 8.3* 8.4* 6.4*  HCT 26.4* 24.6* 19.5*  MCV 91.0 86.6 89.9  PLT 132* 131* 297   Cardiac Enzymes:  Recent Labs Lab 03/25/15 1315 03/25/15 2223 03/26/15 0510  TROPONINI 0.03 0.03 0.03   CBG:  Recent Labs Lab 03/31/15 1158 03/31/15 1738 03/31/15 2311 04/01/15 0510 04/01/15 0802  GLUCAP 177* 161* 154* 181* 132*   Sepsis Labs:  Recent Labs Lab 03/25/15 1315 03/26/15 0510 03/27/15 0404 03/28/15 0357 04/01/15 0450  PROCALCITON 147.10 80.81 42.12  --   --   WBC  --  24.8*  --  15.4* 14.8*  LATICACIDVEN 2.3*  --   --   --   --     Microbiology Recent Results (from the past 240 hour(s))  MRSA PCR Screening     Status: None   Collection Time: 03/24/15  8:52 AM  Result Value Ref Range Status   MRSA by PCR NEGATIVE NEGATIVE Final    Comment:        The GeneXpert MRSA Assay (FDA approved for NASAL specimens only), is one component of a comprehensive MRSA colonization surveillance program. It is not intended to diagnose MRSA infection nor to guide or monitor treatment for MRSA infections.   Culture, blood (x 2)     Status: None   Collection Time: 03/25/15  9:30 AM  Result Value Ref Range Status   Specimen Description BLOOD RIGHT HAND  Final   Special Requests BOTTLES DRAWN AEROBIC ONLY 5CC  Final   Culture   Final    NO GROWTH 5 DAYS Performed at [facility]    Report Status 03/31/2015 FINAL  Final  Culture, blood (x 2)     Status: None   Collection Time: 03/25/15  9:35 AM  Result Value Ref Range Status   Specimen Description BLOOD RIGHT ARM  Final   Special Requests BOTTLES DRAWN AEROBIC AND ANAEROBIC 6CC  Final   Culture   Final    NO GROWTH 5 DAYS Performed at [facility]    Report Status 03/31/2015 FINAL  Final  Culture, Urine     Status: None   Collection Time: 03/25/15 11:57 AM  Result Value Ref Range Status   Specimen Description URINE, CATHETERIZED  Final   Special Requests NONE  Final   Colony Count NO GROWTH Performed at [facility]   Final   Culture NO GROWTH Performed at [facility]   Final   Report Status 03/26/2015 FINAL  Final     Medications:   .  sodium chloride   Intravenous Once  . alteplase  2 mg Intracatheter Once  . antiseptic oral rinse  7 mL Mouth Rinse q12n4p  . aspirin  300 mg Rectal Daily   Or  . aspirin  325 mg Oral Daily  . chlorhexidine  15 mL Mouth Rinse BID  . insulin aspart  0-15 Units Subcutaneous 4 times per day  . lip balm  1 application Topical BID  . metoprolol  2.5 mg Intravenous 4 times per day   Continuous  Infusions: . sodium chloride 10 mL/hr (03/29/15 0800)  . Marland KitchenTPN (CLINIMIX-E) Adult 70 mL/hr at 03/31/15 1701   And  . fat emulsion 240 mL (03/31/15 1701)  . lactated ringers Stopped (03/28/15 0800)    Time spent: 25 minutes.   LOS: 14 days   Elysa Womac  [facility] Pager [phone]. If unable to reach me by pager, please call my cell phone at [phone].  *Please refer to [email or web address], password [email or web address] to get updated schedule on who will round on this patient, as hospitalists switch teams weekly. If 7PM-7AM, please contact night-coverage at [email or web address], password [email or web address] for any overnight needs.  04/01/2015, 9:27 AM

## 2015-04-01 NOTE — Progress Notes (Signed)
Nutrition Follow-up  DOCUMENTATION CODES:  Non-severe (moderate) malnutrition in context of chronic illness  INTERVENTION: - Continue TPN per pharmacy with wean as PO intakes improve - Encourage PO intake - Will order Ensure Enlive po BID, each supplement provides 350 kcal and 20 grams of protein - RD to continue to monitor for needs  NUTRITION DIAGNOSIS:  Malnutrition related to chronic illness as evidenced by moderate depletion of body fat, severe depletion of muscle mass. -ongoing  GOAL:  Patient will meet greater than or equal to 90% of their needs -met with TPN regimen  MONITOR:  Diet advancement, Labs, Weight trends, Skin, I & O's  ASSESSMENT: 81 year old female with history of hypertension, coronary artery disease, hyperlipidemia, iron deficiency anemia, depression, osteoporosis, mild chronic kidney disease presented to the ED with ongoing nausea for 4 days and diffuse crampy lower abdominal pain since one day prior to admission. Associated poor by mouth intake and generalized weakness. Patient was found to have small bowel obstruction.  NGT now removed. Pt continues with Clinimix E 5/15 @ 70 mL/hr with 20% lipids @ 10 mL/hr. This regimen is providing 84 grams protein, 1672 kcal. SLP saw pt and diet has been advanced to dysphagia 3 at 9:47 AM today. Pt reports she was able to eat some oatmeal, applesauce, and drink juice. She indicates she is having some abdominal pain but does not feel it was related to intakes. Will order Ensure Enlive BID to supplement.   Plan for today per pharmacy:  At 1800 today:  Continue to Clinimix E 5/15 at goal rate 70 ml/hr. Continue 20% fat emulsion at 40m/hr.  If potassium or phosphorus increase or remain elevated, may consider removing electrolytes from TPN  TPN to contain standard multivitamins and trace elements.  [place]  TPN lab panels on Mondays & Thursdays.  F/u weaning TPN when pt is  tolerating PO intake.  Medications reviewed. Labs reviewed; CBGs: 132-181 mg/dL.  Height:  Ht Readings from Last 1 Encounters:  03/29/15 _0  (1.626 m)    Weight:  Wt Readings from Last 1 Encounters:  03/27/15 149 lb 4 oz (67.7 kg)    Ideal Body Weight:  54.5 kg  Wt Readings from Last 10 Encounters:  03/27/15 149 lb 4 oz (67.7 kg)  12/31/14 145 lb (65.772 kg)  12/07/14 147 lb 8 oz (66.906 kg)  09/23/14 150 lb (68.04 kg)  08/19/14 150 lb (68.04 kg)  07/22/14 153 lb 12 oz (69.741 kg)  06/24/14 156 lb (70.761 kg)  05/26/14 162 lb (73.483 kg)  04/12/14 164 lb (74.39 kg)  03/14/14 172 lb (78.019 kg)    BMI:  Body mass index is 25.61 kg/(m^2).  Estimated Nutritional Needs:  Kcal:  1500-1700  Protein:  75-85g  Fluid:  1.5L/day  Skin:  Wound (see comment) (surgical incision and puncture wound on abdomen)  Diet Order:  TPN (CLINIMIX-E) Adult DIET DYS 3 Room service appropriate?: Yes; Fluid consistency:: Thin TPN (CLINIMIX-E) Adult  EDUCATION NEEDS:  No education needs identified at this time   Intake/Output Summary (Last 24 hours) at 04/01/15 1257 Last data filed at 04/01/15 0700  Gross per 24 hour  Intake 3585.32 ml  Output   2075 ml  Net 1510.32 ml    Last BM:  No documentation   JJarome Matin RD, LDN Inpatient Clinical Dietitian Pager # [phone] hours/weekend pager # [phone]

## 2015-04-01 NOTE — Progress Notes (Signed)
PT Cancellation Note  Patient Details Name: [name] MRN: [id number] DOB: 1933/08/02   Cancelled Treatment:     HgB 6.4         Will check back later as schedule permits   Armando Reichert 04/01/2015, 2:54 PM

## 2015-04-01 NOTE — Progress Notes (Signed)
PARENTERAL NUTRITION CONSULT NOTE - follow up  Pharmacy Consult for TPN Indication: bowel obstruction  Allergies  Allergen Reactions  . Amlodipine Besylate     REACTION: dizzy  . Aspirin   . Atenolol     REACTION: fatigue  . Benazepril     cough  . Benicar [Olmesartan Medoxomil]     HA  . Cozaar     nausea  . Hydrochlorothiazide W-Triamterene     REACTION: dizzy  . Hydrocodone     REACTION: HA  . Hydroxyzine Pamoate   . Iodine   . Lisinopril     REACTION: tired, cough  . Penicillins Itching    tolerates cephalosporins OK  . Pravastatin     myalgias  . Prednisolone     My stomach hurts  . Tramadol Hcl     REACTION: HA    Patient Measurements: Height: 5\' 4"  (162.6 cm) Weight: 149 lb 4 oz (67.7 kg) IBW/kg (Calculated) : 54.7 Adjusted Body Weight: 57.5kg   Vital Signs: Temp: 98.8 F (37.1 C) (05/20 0513) Temp Source: Oral (05/20 0513) BP: 145/59 mmHg (05/20 0513) Pulse Rate: 93 (05/20 0513) Intake/Output from previous day: 05/19 0701 - 05/20 0700 In: 3585.3 [I.V.:600; IV Piggyback:100; TPN:2885.3] Out: 2375 [Urine:2375] Intake/Output from this shift:    Labs:  Recent Labs  04/01/15 0450  WBC 14.8*  HGB 6.4*  HCT 19.5*  PLT 297     Recent Labs  03/31/15 0350 04/01/15 0450  NA 136 PENDING  K 4.8 PENDING  CL 102 PENDING  CO2 25 PENDING  GLUCOSE 176* 185*  BUN 48* 53*  CREATININE 0.98 0.89  CALCIUM 8.2* PENDING  MG 1.7 1.8  PHOS 4.5 4.7*  PROT 5.9*  --   ALBUMIN 1.8*  --   AST 26  --   ALT 14  --   ALKPHOS 98  --   BILITOT 0.4  --    Estimated Creatinine Clearance: 48.5 mL/min (by C-G formula based on Cr of 0.89).    Recent Labs  03/31/15 1738 03/31/15 2311 04/01/15 0510  GLUCAP 161* 154* 181*    Insulin Requirements: 12 units Novolog over 24 hours  Current Nutrition:  Dysphagia diet (start 5/20)  IVF: NS @ 10 ml/hr  Central access: PICC 5/11 TPN start date: 5/11  ASSESSMENT                                                                                                           HPI: 81 year old female with history of hypertension, coronary artery disease, hyperlipidemia, iron deficiency anemia, depression, osteoporosis, mild chronic kidney disease presented to the ED with ongoing nausea for 4 days and diffuse crampy lower abdominal pain since one day prior to admission. Associated poor by mouth intake and generalized weakness. Patient was found to have small bowel obstruction.  Pharmacy consulted to start TPN.  Significant events:  5/12: laparoscopic lysis of adhesions with laparotomy and decompression of bowel 5/13: new AKI 5/15: Continue NG tube and NPO d/t bilious output pending BM.   5/19: Remove NGT 5/20  Completed swallow eval - starting dysphagia diet  Today:   Glucose (goal <150) - CBG range 132-181  Electrolytes - K 5.1 at upper end of range, Mag improved to 1.8, Phos mildly elevated at 4.7 (last NaPhos given 5/15).  Others WNL including Corr Ca 9.76  Renal - new AKI, but SCr now improving.  Hgb decreased to 6.4, planning transfusion of 2 units PRBC 5/20  LFTs - WNL (5/19)  TGs - wnl (5/16)  Prealbumin: 2.7 (5/16)  NUTRITIONAL GOALS                                                                                             RD recs (5/18): 75-85 g/day protein, 1500-1700 Kcal/day. Clinimix E 5/15 at a goal rate of 49ml/hr + 20% fat emulsion at 40ml/hr to provide: 84 g/day protein, 1672 Kcal/day.  PLAN                                                                                                                         At 1800 today:  Continue to Clinimix E 5/15 at goal rate 70 ml/hr.  Continue 20% fat emulsion at 21ml/hr.  If potassium or phosphorus increase or remain elevated, may consider removing electrolytes from TPN  TPN to contain standard multivitamins and trace elements.  Maintain IVF at [facility]  Continue mod SSI q6h  TPN lab panels on Mondays & Thursdays.  F/u weaning  TPN when pt is tolerating PO intake.   Lynann Beaver PharmD, BCPS Pager [phone] 04/01/2015 11:53 AM

## 2015-04-01 NOTE — Progress Notes (Signed)
Occupational Therapy Treatment Patient Details Name: [name] MRN: [id number] DOB: 19-Jul-1933 Today's Date: 04/01/2015    History of present illness 81 year old female with history of hypertension, coronary artery disease, hyperlipidemia, diabetes and pt s/p lap converted to open LOA for SBO on 5/12, complicated by Left sided facial droop / right arm weakness / acute CVA in left lentiform nucleus however neurologist note states reviewed MRI and no acute changes seen   OT comments  Pt making very slow progress.  She was able to perform bed mobility with max A, and sat EOB with min guard for ~2 mins, otherwise required mod A for EOB sitting.   She performed minimal UE exercise.  Fatigues quickly.  She will require SNF level rehab at discharge.   Follow Up Recommendations  SNF    Equipment Recommendations  None recommended by OT    Recommendations for Other Services      Precautions / Restrictions Precautions Precautions: Fall       Mobility Bed Mobility Overal bed mobility: Needs Assistance Bed Mobility: Rolling;Sidelying to Sit;Sit to Sidelying Rolling: Max assist Sidelying to sit: Max assist     Sit to sidelying: Max assist General bed mobility comments: Pt able to assist with rolling by holding onto rail. and able to push trunk up into partial sitting from sidelying to sit.   Transfers                 General transfer comment: unable to safely attempt with +1 assist     Balance Overall balance assessment: Needs assistance Sitting-balance support: Feet supported;Bilateral upper extremity supported Sitting balance-Leahy Scale: Poor Sitting balance - Comments: Pt initially able to maintain EOB sitting with min guard assist, fell backwards without any attempt to correct.  Required max A to recover, then progressed to mod A for balance as she fatigued  Postural control: Posterior lean;Left lateral lean                         ADL Overall ADL's :  Needs assistance/impaired                             Toileting- Clothing Manipulation and Hygiene: Total assistance Toileting - Clothing Manipulation Details (indicate cue type and reason): Pt on bedpan, required total A for peri care        General ADL Comments: Pt assisted with peri care then moved to EOB       Vision                     Perception     Praxis      Cognition   Behavior During Therapy: [facility] for tasks assessed/performed Overall Cognitive Status: No family/caregiver present to determine baseline cognitive functioning                       Extremity/Trunk Assessment               Exercises Other Exercises Other Exercises: AAROM x 8 Rt shoulder flexion.   Pt too fatigued to perform Lt. UE    Shoulder Instructions       General Comments      Pertinent Vitals/ Pain       Pain Assessment: Faces Faces Pain Scale: Hurts little more Pain Location: abdomen Pain Descriptors / Indicators: Grimacing;Guarding Pain Intervention(s): Repositioned  Home Living  Prior Functioning/Environment              Frequency Min 2X/week     Progress Toward Goals  OT Goals(current goals can now be found in the care plan section)     ADL Goals Pt Will Perform Grooming: with set-up;sitting Additional ADL Goal #1: Pt will perform UB adls with min A, seated Additional ADL Goal #2: Pt will perform bed mobility with mod A in preparation for toilet transfers Additional ADL Goal #3: Pt/family will be independent with AAROM HEP to strengthen bil UEs  Plan Discharge plan needs to be updated    Co-evaluation                 End of Session     Activity Tolerance Patient limited by fatigue   Patient Left in bed;with call bell/phone within reach;with bed alarm set   Nurse Communication Mobility status        Time: 1421-1445 OT Time Calculation (min): 24  min  Charges: OT General Charges $OT Visit: 1 Procedure OT Treatments $Therapeutic Activity: 23-37 mins  Paityn Balsam M 04/01/2015, 4:58 PM

## 2015-04-02 ENCOUNTER — Inpatient Hospital Stay (HOSPITAL_COMMUNITY): Payer: Commercial Managed Care - HMO

## 2015-04-02 DIAGNOSIS — I639 Cerebral infarction, unspecified: Secondary | ICD-10-CM

## 2015-04-02 DIAGNOSIS — N179 Acute kidney failure, unspecified: Secondary | ICD-10-CM | POA: Diagnosis present

## 2015-04-02 DIAGNOSIS — A4159 Other Gram-negative sepsis: Secondary | ICD-10-CM | POA: Diagnosis present

## 2015-04-02 DIAGNOSIS — A419 Sepsis, unspecified organism: Secondary | ICD-10-CM | POA: Diagnosis present

## 2015-04-02 LAB — MAGNESIUM: MAGNESIUM: 1.7 mg/dL (ref 1.7–2.4)

## 2015-04-02 LAB — BASIC METABOLIC PANEL
ANION GAP: 7 (ref 5–15)
BUN: 48 mg/dL — ABNORMAL HIGH (ref 6–20)
CO2: 25 mmol/L (ref 22–32)
Calcium: 8.5 mg/dL — ABNORMAL LOW (ref 8.9–10.3)
Chloride: 103 mmol/L (ref 101–111)
Creatinine, Ser: 0.8 mg/dL (ref 0.44–1.00)
GFR calc Af Amer: >60 mL/min (ref >60)
GFR calc non Af Amer: >60 mL/min (ref >60)
GLUCOSE: 163 mg/dL — AB (ref 65–99)
Potassium: 5.8 mmol/L — ABNORMAL HIGH (ref 3.5–5.1)
SODIUM: 135 mmol/L (ref 135–145)

## 2015-04-02 LAB — TYPE AND SCREEN
ABO/RH(D): B POS
ANTIBODY SCREEN: POSITIVE
DAT, IgG: NEGATIVE
Donor AG Type: NEGATIVE
Donor AG Type: NEGATIVE
UNIT DIVISION: 0
Unit division: 0

## 2015-04-02 LAB — GLUCOSE, CAPILLARY
GLUCOSE-CAPILLARY: 151 mg/dL — AB (ref 65–99)
Glucose-Capillary: 211 mg/dL — ABNORMAL HIGH (ref 65–99)
Glucose-Capillary: 135 mg/dL — ABNORMAL HIGH (ref 65–99)

## 2015-04-02 LAB — CBC
HCT: 28.7 % — ABNORMAL LOW (ref 36.0–46.0)
Hemoglobin: 9.1 g/dL — ABNORMAL LOW (ref 12.0–15.0)
MCH: 26 pg (ref 26.0–34.0)
MCHC: 31.7 g/dL (ref 30.0–36.0)
MCV: 82 fL (ref 78.0–100.0)
Platelets: 364 10*3/uL (ref 150–400)
RBC: 3.5 MIL/uL — ABNORMAL LOW (ref 3.87–5.11)
RDW: 22.5 % — ABNORMAL HIGH (ref 11.5–15.5)
WBC: 16.9 10*3/uL — ABNORMAL HIGH (ref 4.0–10.5)

## 2015-04-02 LAB — POTASSIUM: POTASSIUM: 5.7 mmol/L — AB (ref 3.5–5.1)

## 2015-04-02 LAB — PHOSPHORUS: Phosphorus: 4.6 mg/dL (ref 2.5–4.6)

## 2015-04-02 MED ORDER — SODIUM POLYSTYRENE SULFONATE 15 GM/60ML PO SUSP
15.0000 g | Freq: Once | ORAL | Status: AC
  Administered 2015-04-02: 15 g via ORAL
  Filled 2015-04-02: qty 60

## 2015-04-02 MED ORDER — DEXTROSE 10 % IV SOLN
INTRAVENOUS | Status: AC
  Administered 2015-04-02: 14:00:00 via INTRAVENOUS
  Filled 2015-04-02: qty 1000

## 2015-04-02 MED ORDER — M.V.I. ADULT IV INJ
INJECTION | INTRAVENOUS | Status: AC
  Administered 2015-04-02: 18:00:00 via INTRAVENOUS
  Filled 2015-04-02: qty 1680

## 2015-04-02 MED ORDER — IOHEXOL 300 MG/ML  SOLN
50.0000 mL | Freq: Once | INTRAMUSCULAR | Status: AC | PRN
  Administered 2015-04-02: 50 mL via ORAL

## 2015-04-02 MED ORDER — MAGNESIUM SULFATE IN D5W 10-5 MG/ML-% IV SOLN
1.0000 g | Freq: Once | INTRAVENOUS | Status: AC
  Administered 2015-04-02: 1 g via INTRAVENOUS
  Filled 2015-04-02: qty 100

## 2015-04-02 MED ORDER — IOHEXOL 300 MG/ML  SOLN
100.0000 mL | Freq: Once | INTRAMUSCULAR | Status: AC | PRN
  Administered 2015-04-02: 100 mL via INTRAVENOUS

## 2015-04-02 MED ORDER — FAT EMULSION 20 % IV EMUL
240.0000 mL | INTRAVENOUS | Status: AC
  Administered 2015-04-02: 240 mL via INTRAVENOUS
  Filled 2015-04-02: qty 250

## 2015-04-02 NOTE — Progress Notes (Signed)
Patient ID: [name], female   DOB: January 23, 1933, 81 y.o.   MRN: [id number] 9 Days Post-Op  Subjective: Patient will alert daily. Daughter is at bedside. No specific complaints today other than just week. Just ate a few bites yesterday. They are not sure about bowel movements but one recorded yesterday.  Objective: Vital signs in last 24 hours: Temp:  [98.6 F (37 C)-100 F (37.8 C)] 98.6 F (37 C) (05/21 0557) Pulse Rate:  [96-107] 96 (05/21 0557) Resp:  [18-20] 18 (05/21 0557) BP: (129-161)/(58-73) 151/60 mmHg (05/21 0557) SpO2:  [100 %] 100 % (05/21 0557) Last BM Date: 04/01/15  Intake/Output from previous day: 05/20 0701 - 05/21 0700 In: 840.8 [P.O.:150; Blood:690.8] Out: 2025 [Urine:2025] Intake/Output this shift:    General appearance: cooperative, no distress, slowed mentation and more alert and conversant each day GI: mild appropriate incisional tenderness. Nondistended. Incision/Wound: clean and dry without evidence of infection  Lab Results:   Recent Labs  04/01/15 0450 04/02/15 0537  WBC 14.8* 16.9*  HGB 6.4* 9.1*  HCT 19.5* 28.7*  PLT 297 364   BMET  Recent Labs  04/01/15 0450 04/02/15 0537  NA 135 135  K 5.1 5.8*  CL 104 103  CO2 24 25  GLUCOSE 185* 163*  BUN 53* 48*  CREATININE 0.89 0.80  CALCIUM 8.0* 8.5*     Studies/Results: Dg Swallowing Func-speech Pathology  04/01/2015    Objective Swallowing Evaluation:    Patient Details  Name: [name] MRN: [id number] Date of Birth: October 23, 1933  Today's Date: 04/01/2015 Time: SLP Start Time (ACUTE ONLY): 0835-SLP Stop Time (ACUTE ONLY): 0857 SLP Time Calculation (min) (ACUTE ONLY): 22 min  Past Medical History:  Past Medical History  Diagnosis Date  . CAD (coronary artery disease)     s/p stenting of LAD 1999- cath 5-08 EF normal LAD 30-40% restenosis. D1  50% D2 80% LCX & RCA minimal plaque  . HTN (hypertension)   . Hyperlipemia   . Anemia     iron deficiency  . Depression   . DVT (deep venous  thrombosis)   . Gout   . Osteoporosis   . Pancreatitis   . GERD (gastroesophageal reflux disease)   . Renal insufficiency     Cr 1.2-1.3  . Obesity   . Diabetes mellitus   . Polyarthritis     DJD/ possible PMR  . Vitamin D deficiency   . B12 deficiency   . Tinnitus   . Anxiety   . Aneurysm, thoracic aortic   . Constipation   . Urinary frequency   . Vertigo   . Chronic back pain    Past Surgical History:  Past Surgical History  Procedure Laterality Date  . Hemorrhoid surgery    . Abdominal hysterectomy    . Cholecystectomy    . Tubal ligation    . Coronary angioplasty with stent placement  1999    LAD stent  . Cardiac catheterization  2008    L main 20%, LAD stent patent, D1 50%, D2 80% (small), RCA 20%, EF 55-60%   . Laparoscopy N/A 03/24/2015    Procedure: LAPAROSCOPY DIAGNOSTIC;  Surgeon: Luretha Murphy, MD;   Location: [facility];  Service: General;  Laterality: N/A;  . Laparoscopic lysis of adhesions N/A 03/24/2015    Procedure: LAPAROSCOPIC LYSIS OF ADHESIONS;  Surgeon: Luretha Murphy,  MD;  Location: [facility];  Service: General;  Laterality: N/A;  . Laparotomy N/A 03/24/2015    Procedure: LAPAROTOMY with decompression of bowel;  Surgeon: Luretha Murphy, MD;  Location: [facility];  Service: General;  Laterality: N/A;   HPI:  Other Pertinent Information: Pt is a 81 year old female admitted 03/18/15  with SBO secondary to adhesions. She failed conservative therapy and  underwent laparoscopic lysis of adhesions/laparotomy with bowel  decompression on 03/24/15. Her hospital course was complicated by sepsis  secondary to peritoneal contamination with transfer to the SDU on 03/25/15.  She also developed left-sided facial droop/right arm weakness 03/27/15 with  CT scan showing a 4 mm lacunar infarct in the left lentiform nucleus. MRI  failed to show any abnormality. After removal of NG for suction, MD  ordered MBS prior to initiating diet.   No Data Recorded  Assessment / Plan / Recommendation CHL IP CLINICAL IMPRESSIONS 04/01/2015   Therapy Diagnosis Mild pharyngeal phase dysphagia;Mild oral phase  dysphagia  Clinical Impression Mild oral dysphagia characterized by decreased lingual  formation of bolus, improved with straw sips and improving progressively  during exam. oropharyngeal dysphagia also mild with no penetration or  aspiration. Suspect mild standing secretions intially during exam as  pharyngeal residuals decreased and timing of swallow improved with each  trial. Though swallow was initially delayed, by end of exam pt taking  large consecutive sips of thin with minimal delay and minimal residuals.  Recommend pt initiate a dys 3 (mechanical soft) diet with basic aspiration  precautions of upright posture and occasional second swallow to clear oral  cavity and orophrayngeal residuals. SLP will f/u for tolerance during  acute stay.       CHL IP TREATMENT RECOMMENDATION 04/01/2015  Treatment Recommendations Therapy as outlined in treatment plan below     CHL IP DIET RECOMMENDATION 04/01/2015  SLP Diet Recommendations Dysphagia 3 (Mech soft);Thin  Liquid Administration via (None)  Medication Administration Whole meds with liquid  Compensations Multiple dry swallows after each bite/sip  Postural Changes and/or Swallow Maneuvers (None)     CHL IP OTHER RECOMMENDATIONS 04/01/2015  Recommended Consults (None)  Oral Care Recommendations Oral care BID  Other Recommendations (None)     CHL IP FOLLOW UP RECOMMENDATIONS 03/29/2015  Follow up Recommendations 24 hour supervision/assistance     CHL IP FREQUENCY AND DURATION 04/01/2015  Speech Therapy Frequency (ACUTE ONLY) min 2x/week  Treatment Duration 2 weeks     Pertinent Vitals/Pain NA    SLP Swallow Goals No flowsheet data found.  No flowsheet data found.    CHL IP REASON FOR REFERRAL 04/01/2015  Reason for Referral Objectively evaluate swallowing function     CHL IP ORAL PHASE 04/01/2015  Lips (None)  Tongue (None)  Mucous membranes (None)  Nutritional status (None)  Other (None)  Oxygen therapy  (None)  Oral Phase Impaired  Oral - Pudding Teaspoon (None)  Oral - Pudding Cup (None)  Oral - Honey Teaspoon (None)  Oral - Honey Cup (None)  Oral - Honey Syringe (None)  Oral - Nectar Teaspoon (None)  Oral - Nectar Cup (None)  Oral - Nectar Straw (None)  Oral - Nectar Syringe (None)  Oral - Ice Chips (None)  Oral - Thin Teaspoon (None)  Oral - Thin Cup (None)  Oral - Thin Straw (None)  Oral - Thin Syringe (None)  Oral - Puree (None)  Oral - Mechanical Soft (None)  Oral - Regular (None)  Oral - Multi-consistency (None)  Oral - Pill (None)  Oral Phase - Comment (None)      CHL IP PHARYNGEAL PHASE 04/01/2015  Pharyngeal Phase Impaired  Pharyngeal -  Pudding Teaspoon (None)  Penetration/Aspiration details (pudding teaspoon) (None)  Pharyngeal - Pudding Cup (None)  Penetration/Aspiration details (pudding cup) (None)  Pharyngeal - Honey Teaspoon (None)  Penetration/Aspiration details (honey teaspoon) (None)  Pharyngeal - Honey Cup (None)  Penetration/Aspiration details (honey cup) (None)  Pharyngeal - Honey Syringe (None)  Penetration/Aspiration details (honey syringe) (None)  Pharyngeal - Nectar Teaspoon (None)  Penetration/Aspiration details (nectar teaspoon) (None)  Pharyngeal - Nectar Cup (None)  Penetration/Aspiration details (nectar cup) (None)  Pharyngeal - Nectar Straw (None)  Penetration/Aspiration details (nectar straw) (None)  Pharyngeal - Nectar Syringe (None)  Penetration/Aspiration details (nectar syringe) (None)  Pharyngeal - Ice Chips (None)  Penetration/Aspiration details (ice chips) (None)  Pharyngeal - Thin Teaspoon (None)  Penetration/Aspiration details (thin teaspoon) (None)  Pharyngeal - Thin Cup (None)  Penetration/Aspiration details (thin cup) (None)  Pharyngeal - Thin Straw (None)  Penetration/Aspiration details (thin straw) (None)  Pharyngeal - Thin Syringe (None)  Penetration/Aspiration details (thin syringe') (None)  Pharyngeal - Puree (None)  Penetration/Aspiration details (puree) (None)   Pharyngeal - Mechanical Soft (None)  Penetration/Aspiration details (mechanical soft) (None)  Pharyngeal - Regular (None)  Penetration/Aspiration details (regular) (None)  Pharyngeal - Multi-consistency (None)  Penetration/Aspiration details (multi-consistency) (None)  Pharyngeal - Pill (None)  Penetration/Aspiration details (pill) (None)  Pharyngeal Comment (None)      No flowsheet data found.  No flowsheet data found.        Harlon Ditty, [place] CCC-SLP [phone]  Claudine Mouton 04/01/2015, 10:31 AM     Anti-infectives: Anti-infectives    Start     Dose/Rate Route Frequency Ordered Stop   03/26/15 1600  cefTRIAXone (ROCEPHIN) 2 g in dextrose 5 % 50 mL IVPB - Premix    Comments:  Pharmacy may adjust dosing strength / duration / interval for maximal efficacy   2 g 100 mL/hr over 30 Minutes Intravenous Every 24 hours 03/26/15 1007 03/30/15 1535   03/26/15 1200  metroNIDAZOLE (FLAGYL) IVPB 500 mg     500 mg 100 mL/hr over 60 Minutes Intravenous Every 6 hours 03/26/15 1007 03/30/15 1850   03/25/15 1600  cefOXitin (MEFOXIN) 1 g in dextrose 5 % 50 mL IVPB  Status:  Discontinued     1 g 100 mL/hr over 30 Minutes Intravenous Every 12 hours 03/25/15 0801 03/26/15 1007   03/25/15 0800  cefOXitin (MEFOXIN) 1 g in dextrose 5 % 50 mL IVPB  Status:  Discontinued     1 g 100 mL/hr over 30 Minutes Intravenous 3 times per day 03/25/15 0752 03/25/15 0758   03/24/15 1700  cefOXitin (MEFOXIN) 1 g in dextrose 5 % 50 mL IVPB     1 g 100 mL/hr over 30 Minutes Intravenous Every 6 hours 03/24/15 1511 03/25/15 0512   03/24/15 0915  cefOXitin (MEFOXIN) 2 g in dextrose 5 % 50 mL IVPB    Comments:  Pharmacy may adjust dosing strength, interval, or rate of medication as needed for optimal therapy for the patient Send with patient on call to the OR.  Anesthesia to complete antibiotic administration <64min prior to incision per [facility].   2 g 100 mL/hr over 30 Minutes Intravenous On call to O.R. 03/24/15 0910  03/24/15 1042      Assessment/Plan: s/p Procedure(s): LAPAROSCOPY DIAGNOSTIC LAPAROSCOPIC LYSIS OF ADHESIONS LAPAROTOMY with decompression of bowel Seems to be progressing well. Leukocytosis of uncertain source. Continue diet as tolerated. Repeat CBC in a.m. Postoperative anemia, status post transfusion yesterday.   LOS: 15 days    Azucena Dart T  04/02/2015  

## 2015-04-02 NOTE — Progress Notes (Signed)
PARENTERAL NUTRITION CONSULT NOTE - follow up  Pharmacy Consult for TPN Indication: bowel obstruction  Allergies  Allergen Reactions  . Amlodipine Besylate     REACTION: dizzy  . Aspirin   . Atenolol     REACTION: fatigue  . Benazepril     cough  . Benicar [Olmesartan Medoxomil]     HA  . Cozaar     nausea  . Hydrochlorothiazide W-Triamterene     REACTION: dizzy  . Hydrocodone     REACTION: HA  . Hydroxyzine Pamoate   . Iodine   . Lisinopril     REACTION: tired, cough  . Penicillins Itching    tolerates cephalosporins OK  . Pravastatin     myalgias  . Prednisolone     My stomach hurts  . Tramadol Hcl     REACTION: HA    Patient Measurements: Height: 5\' 4"  (162.6 cm) Weight: 149 lb 4 oz (67.7 kg) IBW/kg (Calculated) : 54.7 Adjusted Body Weight: 57.5kg   Vital Signs: Temp: 98.6 F (37 C) (05/21 0557) Temp Source: Oral (05/21 0557) BP: 151/60 mmHg (05/21 0557) Pulse Rate: 96 (05/21 0557) Intake/Output from previous day: 05/20 0701 - 05/21 0700 In: 840.8 [P.O.:150; Blood:690.8] Out: 2025 [Urine:2025] Intake/Output from this shift:    Labs:  Recent Labs  04/01/15 0450 04/02/15 0537  WBC 14.8* 16.9*  HGB 6.4* 9.1*  HCT 19.5* 28.7*  PLT 297 364     Recent Labs  03/31/15 0350 04/01/15 0450 04/02/15 0537  NA 136 135 135  K 4.8 5.1 5.8*  CL 102 104 103  CO2 25 24 25   GLUCOSE 176* 185* 163*  BUN 48* 53* 48*  CREATININE 0.98 0.89 0.80  CALCIUM 8.2* 8.0* 8.5*  MG 1.7 1.8 1.7  PHOS 4.5 4.7* 4.6  PROT 5.9*  --   --   ALBUMIN 1.8*  --   --   AST 26  --   --   ALT 14  --   --   ALKPHOS 98  --   --   BILITOT 0.4  --   --    Estimated Creatinine Clearance: 53.9 mL/min (by C-G formula based on Cr of 0.8).    Recent Labs  04/01/15 1722 04/01/15 2357 04/02/15 0555  GLUCAP 138* 166* 151*    Insulin Requirements: 13 units Novolog over 24 hours  Current Nutrition:  Dysphagia 3 diet (start 5/20) Ensure BID ordered on 5/20 but patient  refused dose on 5/20  IVF: NS @ 10 ml/hr  Central access: PICC 5/11 TPN start date: 5/11  ASSESSMENT                                                                                                          HPI: 81 year old female with history of hypertension, coronary artery disease, hyperlipidemia, iron deficiency anemia, depression, osteoporosis, mild chronic kidney disease presented to the ED with ongoing nausea for 4 days and diffuse crampy lower abdominal pain since one day prior to admission. Associated poor by mouth intake and generalized weakness. Patient was found  to have small bowel obstruction.  Pharmacy consulted to start TPN.  Significant events:  5/12: laparoscopic lysis of adhesions with laparotomy and decompression of bowel 5/13: new AKI 5/15: Continue NG tube and NPO d/t bilious output pending BM.   5/19: Remove NGT 5/20 Completed swallow eval - starting dysphagia diet  Today:   Glucose (goal <150) - CBG range 138-216 (one CBG >200)  Electrolytes - K 5.8 (kayexalate 15 gm x1 per MD), Mag down slightly 1.7, Phos  4.6.  Others WNL including CoCa 10.2  Renal -  SCr now wnl  WBC up 16.9, hgb up 9.1 (s/p 2 units PRBC 5/20)  LFTs - WNL (5/19)  TGs - wnl (5/16)  Prealbumin: 2.7 (5/16)  NUTRITIONAL GOALS                                                                                             RD recs (5/18): 75-85 g/day protein, 1500-1700 Kcal/day. Clinimix E 5/15 at a goal rate of 33ml/hr + 20% fat emulsion at 1ml/hr to provide: 84 g/day protein, 1672 Kcal/day.  PLAN                                                                                                                         1) Magnesium sulfate 1gm IV x1 today 2) potassium remains elevated at 5.7 s/p kayexalate dose.  Will d/c current TPN bag as it contains potassium. Start D10% at 70 ml/hr and d/c at 1800 tonight with start of new TPN bag 3) At 1800 today:  Change to Clinimix 5/15 (electrolyte free  TPN formulation d/t hyperkalemia) at goal rate 70 ml/hr.  Continue 20% fat emulsion at 1ml/hr.  TPN to contain standard multivitamins and trace elements.  Maintain IVF at [facility]  Continue mod SSI q6h  TPN lab panels on Mondays & Thursdays.  F/u weaning TPN when pt is tolerating PO intake.   Dorna Leitz, PharmD, BCPS 04/02/2015 8:50 AM

## 2015-04-02 NOTE — Progress Notes (Addendum)
Progress Note   [name] EXN:[id number] DOB: November 06, 1933 DOA: 03/18/2015 PCP: [name], MD   Brief Narrative:   [name] is an 81 y.o. female with a PMH of hypertension, CAD, hyperlipidemia and diabetes who was admitted 03/18/15 with SBO secondary to adhesions. She failed conservative therapy and underwent laparoscopic lysis of adhesions/laparotomy with bowel decompression on 03/24/15. Her hospital course was complicated by sepsis secondary to peritoneal contamination with transfer to the SDU on 03/25/15. She also developed left-sided facial droop/right arm weakness 03/27/15 with CT scan showing a 4 mm lacunar infarct in the left lentiform nucleus. MRI failed to show any abnormality. Neurology is following. Barrier to discharge his ongoing need for TPN for nutritional purposes.  Assessment/Plan:   Principal Problem:   Small bowel obstruction s/p exlap/LOA/decompression 03/25/2015 complicated by severe sepsis secondary to peritonitis / abdominal pain - Status post laparoscopic lysis of adhesions/laparotomy with bowel decompression 03/24/15. - Subsequently developed acute peritonitis/sepsis. - Given rising WBC and ongoing high pain medication needs, will obtain CT abdomen/pelvis, rule out abscess. - Initially treated with Mefoxin starting 03/25/15. Antibiotics switched to Flagyl/Rocephin 03/26/15.  Will D/C antibiotics if CT abd clear. - Has tolerated discontinuation of NG tube, diet advanced to dysphagia 04/01/15 but poor appetite.  Active Problems:   Facial droop / right arm weakness / possible acute CVA left lentiform nucleus - On 03/27/15, CT of the head done to evaluate symptoms showed an acute 4 mm lacunar infarct in the left lentiform nucleus. - Subsequent MRI negative, although clinical findings consistent with acute CVA. MRA negative for stenosis. - 2-D echo with normal EF, no diastolic dysfunction noted. No intracardiac masses or thrombi. - Carotid Dopplers negative for  significant stenosis (1-39% bilaterally). - Hemoglobin A1c 6.3%. Cholesterol 99. - Evaluated by neurology, no further stroke workup recommended. - Continue aspirin. - PT/OT.    Hypokalemia/hypomagnesemia - Monitor and replace electrolytes as needed.    Acute postoperative blood loss anemia - Initial hemoglobin 10.2, has dropped approximately 2 g postoperatively. - Hemoglobin 6.4 on recheck 04/01/15, given 2 units of PRBCs. Hemoglobin 9.1 today.    Diabetes type 2, controlled - Currently being managed with moderate scale SSI every 6 hours. CBGs 132-216. - Hemoglobin A1c 6.3%.    Anxiety state/Adjustment disorder with mixed anxiety and depressed mood - Mood has been stable.    Coronary atherosclerosis - Continue aspirin therapy.    Acute renal failure secondary to sepsis  - Resolved with IV fluids.    Right foot pain  - Radiographs negative for fracture. Degenerative changes noted.    Polymyalgia rheumatica/Chronic fatigue disorder - PT/OT.    Elevated blood pressure - Continue as needed Lopressor.    Malnutrition of moderate degree - Continue TPN for nutritional support. Can likely start to wean now that her diet has been advanced.    Acute delirium - Avoid sedating medications. Family requests patient does not get Ativan or Haldol.    Thrombocytopenia - Mild. Likely related to recent sepsis.    DVT Prophylaxis - Continue SCDs.  Code Status: Full. Family Communication: Daughter, at the bedside.   Disposition Plan: Home with daughter when diet advanced, difficult to predict due to long hospitalization, ongoing need for IV pain medication, possibly 04/04/15.   IV Access:    PICC placed 03/23/15   Procedures and diagnostic studies:   Ct Head Wo Contrast  03/27/2015   CLINICAL DATA:  RIGHT-sided facial droop. Symptoms began earlier today. Altered mental status.  EXAM: CT HEAD WITHOUT CONTRAST  TECHNIQUE: Contiguous axial images were obtained from the base of the  skull through the vertex without intravenous contrast.  COMPARISON:  05/27/2014.  FINDINGS: Possible acute 4 mm lacune, LEFT lentiform nucleus as seen on image 12 series 5. This was not clearly present on in 2015 scan.  No other areas of concern for cortical or subcortical infarction.  No hemorrhage, mass lesion, hydrocephalus, or extra-axial fluid.  Cerebral and cerebellar atrophy. Generalized white matter hypoattenuation, likely small vessel disease. Than the calvarium intact. Vascular calcification. No sinus or mastoid disease. Dense lenticular opacities.  IMPRESSION: Possible acute 4 mm lacunar infarct LEFT lentiform nucleus. If further investigation desired, and no contraindications, consider MRI brain.   Electronically Signed   By: Davonna Belling M.D.   On: 03/27/2015 16:25   Mr Maxine Glenn Head Wo Contrast  03/28/2015   CLINICAL DATA:  81 year old with right-sided facial droop. Altered mental status. Subsequent encounter.  EXAM: MRI HEAD WITHOUT CONTRAST  MRA HEAD WITHOUT CONTRAST  TECHNIQUE: Multiplanar, multiecho pulse sequences of the brain and surrounding structures were obtained without intravenous contrast. Angiographic images of the head were obtained using MRA technique without contrast.  COMPARISON:  03/27/2015 head CT.  FINDINGS: MRI HEAD FINDINGS  No acute infarct.  No intracranial hemorrhage.  Remote tiny infarct right cerebellum. Minimal small vessel disease type changes.  Incidentally noted is a prominent peri vascular space left lenticular nucleus.  Global atrophy without hydrocephalus.  No intracranial mass lesion noted on this unenhanced exam.  Cervical medullary junction unremarkable. Mild spinal stenosis C3-4.  Expanded partially empty sella without other findings of pseudotumor cerebri  Pineal region unremarkable.  No intracranial mass lesion noted on this unenhanced exam.  MRA HEAD FINDINGS  Mild narrowing pre cavernous/ cavernous segment right internal carotid artery.  Mild ectasia left  internal carotid artery cavernous segment. The minimal bulge along the left internal carotid artery cavernous segment most likely related to slight ectasia of the left ophthalmic artery origin rather than small aneurysm. Mild motion degradation limits evaluation.  Slightly ectatic anterior communicating artery without aneurysm.  Middle cerebral artery mild branch vessel irregularity bilaterally.  No significant stenosis distal vertebral arteries or basilar artery. Regions of mild irregularity and slight narrowing.  IMPRESSION: MRI HEAD  No acute infarct.  Remote tiny infarct right cerebellum.  Incidentally noted is a prominent peri vascular space left lenticular nucleus.  Global atrophy without hydrocephalus.  Expanded partially empty sella without other findings of pseudotumor cerebri  MRA HEAD FINDINGS  No medium or large size vessel significant stenosis or occlusion. Evaluation of branch vessels slightly limited by motion degradation.   Electronically Signed   By: Lacy Duverney M.D.   On: 03/28/2015 12:02   Mr Brain Wo Contrast  03/28/2015   CLINICAL DATA:  81 year old with right-sided facial droop. Altered mental status. Subsequent encounter.  EXAM: MRI HEAD WITHOUT CONTRAST  MRA HEAD WITHOUT CONTRAST  TECHNIQUE: Multiplanar, multiecho pulse sequences of the brain and surrounding structures were obtained without intravenous contrast. Angiographic images of the head were obtained using MRA technique without contrast.  COMPARISON:  03/27/2015 head CT.  FINDINGS: MRI HEAD FINDINGS  No acute infarct.  No intracranial hemorrhage.  Remote tiny infarct right cerebellum. Minimal small vessel disease type changes.  Incidentally noted is a prominent peri vascular space left lenticular nucleus.  Global atrophy without hydrocephalus.  No intracranial mass lesion noted on this unenhanced exam.  Cervical medullary junction unremarkable. Mild spinal stenosis C3-4.  Expanded  partially empty sella without other findings of  pseudotumor cerebri  Pineal region unremarkable.  No intracranial mass lesion noted on this unenhanced exam.  MRA HEAD FINDINGS  Mild narrowing pre cavernous/ cavernous segment right internal carotid artery.  Mild ectasia left internal carotid artery cavernous segment. The minimal bulge along the left internal carotid artery cavernous segment most likely related to slight ectasia of the left ophthalmic artery origin rather than small aneurysm. Mild motion degradation limits evaluation.  Slightly ectatic anterior communicating artery without aneurysm.  Middle cerebral artery mild branch vessel irregularity bilaterally.  No significant stenosis distal vertebral arteries or basilar artery. Regions of mild irregularity and slight narrowing.  IMPRESSION: MRI HEAD  No acute infarct.  Remote tiny infarct right cerebellum.  Incidentally noted is a prominent peri vascular space left lenticular nucleus.  Global atrophy without hydrocephalus.  Expanded partially empty sella without other findings of pseudotumor cerebri  MRA HEAD FINDINGS  No medium or large size vessel significant stenosis or occlusion. Evaluation of branch vessels slightly limited by motion degradation.   Electronically Signed   By: Lacy Duverney M.D.   On: 03/28/2015 12:02   Ct Abdomen Pelvis W Contrast  03/18/2015   CLINICAL DATA:  Nausea and vomiting for 4 days, anxiety, status post hysterectomy CT pelvis 05/27/2014  EXAM: CT ABDOMEN AND PELVIS WITH CONTRAST  TECHNIQUE: Multidetector CT imaging of the abdomen and pelvis was performed using the standard protocol following bolus administration of intravenous contrast.  CONTRAST:  50mL OMNIPAQUE IOHEXOL 300 MG/ML SOLN, OMNIPAQUE IOHEXOL 300 MG/ML SOLN  COMPARISON:  None.  FINDINGS: Sagittal images of the spine shows diffuse osteopenia. Degenerative changes thoracolumbar spine. The lung bases are unremarkable.  The patient is status post cholecystectomy. Mild intrahepatic biliary ductal dilatation.  There is CBD dilatation up to 1.1 cm. No focal hepatic mass. The pancreas, spleen and adrenal glands are unremarkable. Kidneys are symmetrical in size and enhancement. No hydronephrosis or hydroureter. Delayed renal images shows bilateral renal symmetrical excretion.  Atherosclerotic calcifications of abdominal aorta and iliac arteries. Moderate stool noted in right colon and proximal transverse colon. No colonic obstruction. The descending colon and sigmoid colon are empty collapsed. Some stool noted within rectum.  There are fluid distended small bowel loops in mid upper abdomen and left lower abdomen and pelvis. Small amount of fluid/stranding noted a adjacent to small bowel loops in left lower quadrant see axial image 57. Distal small bowel is decreased caliber collapsed. Findings are consistent with small bowel obstruction.  Mild distended urinary bladder. The patient is status post hysterectomy. The terminal ileum is small caliber decompressed. Contrast material is noted within stomach. No any contrast material is noted within small bowel or colon.  IMPRESSION: 1. There are fluid distended small bowel loops with multiple air-fluid levels highly suspicious for small bowel obstruction. Distal small bowel is small caliber decompressed. Terminal ileum is small caliber. Small amount of fluid/stranding noted adjacent to small bowel loops in left lower quadrant please see images 49 and 57. 2. Moderate stool noted in right colon and proximal transverse colon. Descending colon and sigmoid colon are empty collapsed. 3. No pericecal inflammation. 4. No hydronephrosis or hydroureter. 5. Status post cholecystectomy. Mild intrahepatic biliary ductal dilatation. CBD dilatation up to 1.1 cm. 6. No hydronephrosis or hydroureter. 7. Status post hysterectomy. These results were called by telephone at the time of interpretation on 03/18/2015 at 8:59 am to Dr. Gerhard Munch , who verbally acknowledged these results.    Electronically Signed  By: Natasha Mead M.D.   On: 03/18/2015 08:59   US Renal  03/25/2015   CLINICAL DATA:  Acute renal failure.  EXAM: RENAL / URINARY TRACT ULTRASOUND COMPLETE  COMPARISON:  None.  FINDINGS: Right Kidney:  Length: 8.1 cm. Increased echogenicity consistent with chronic medical renal disease. No mass or hydronephrosis visualized.  Left Kidney:  Length: 9.3 cm. Increased echogenicity consistent chronic medical renal disease. No mass or hydronephrosis visualized.  Bladder:  Bladder decompressed by Foley catheter.  IMPRESSION: Bilateral echodense kidneys consistent chronic medical renal disease. No acute abnormality. No hydronephrosis or bladder distention.   Electronically Signed   By: Maisie Fus  Register   On: 03/25/2015 13:27   Dg Chest Port 1 View  03/27/2015   CLINICAL DATA:  Left PICC placement. Nausea and vomiting. Initial encounter.  EXAM: PORTABLE CHEST - 1 VIEW  COMPARISON:  Chest radiograph performed 03/26/2015  FINDINGS: The patient's left PICC is noted ending about the proximal SVC. An enteric tube is noted extending below the diaphragm.  The lungs are hypoexpanded. Bibasilar and right upper lung zone airspace opacities may reflect atelectasis or pneumonia. No pleural effusion or pneumothorax is seen  The cardiomediastinal silhouette is borderline normal in size. No acute osseous abnormalities are identified.  IMPRESSION: 1. Left PICC noted ending about the proximal SVC. 2. Lungs hypoexpanded. Bibasilar and right upper lung zone airspace opacities may reflect atelectasis or pneumonia.  These results were called by telephone at the time of interpretation on 03/27/2015 at 7:33 pm to Nursing in the [facility], who verbally acknowledged these results.   Electronically Signed   By: Roanna Raider M.D.   On: 03/27/2015 19:34   Dg Chest Port 1 View  03/26/2015   CLINICAL DATA:  Atelectasis  EXAM: PORTABLE CHEST - 1 VIEW  COMPARISON:  Yesterday  FINDINGS: NG tube and left PICC  are stable. Upper normal heart size. Low volumes. Bibasilar atelectasis is not significantly changed. No pneumothorax.  IMPRESSION: Stable bibasilar atelectasis.   Electronically Signed   By: Jolaine Click M.D.   On: 03/26/2015 08:14   Dg Chest Port 1 View  03/25/2015   CLINICAL DATA:  Tachycardia and hypotension; concern for sepsis  EXAM: PORTABLE CHEST - 1 VIEW  COMPARISON:  May 27, 2014  FINDINGS: There is patchy atelectasis in both lung bases. The degree of inspiration is shallow. There is no frank airspace consolidation. Heart is upper normal in size with pulmonary vascularity within normal limits.  Central catheter tip is in the superior vena cava. Nasogastric tube tip and side port are in the distal stomach. No pneumothorax.  IMPRESSION: Bibasilar atelectasis. No pneumothorax. Degree of inspiration shallow.   Electronically Signed   By: Bretta Bang III M.D.   On: 03/25/2015 09:03   Dg Abd Portable 1v  03/24/2015   CLINICAL DATA:  Small-bowel obstruction  EXAM: PORTABLE ABDOMEN - 1 VIEW  COMPARISON:  03/23/2015  FINDINGS: NG tube is been placed with the tip in the gastric antrum  Small bowel dilatation shows mild interval improvement. Colon is decompressed with moderate stool in the right colon. Surgical clips in the gallbladder fossa.  IMPRESSION: Mild improvement in small bowel obstruction. NG tube in the gastric antrum.   Electronically Signed   By: Marlan Palau M.D.   On: 03/24/2015 08:25   Dg Abd Portable 1v  03/23/2015   CLINICAL DATA:  Followup small bowel obstruction.  EXAM: PORTABLE ABDOMEN - 1 VIEW  COMPARISON:  03/22/2015  FINDINGS:  Dilated loops small bowel are similar to the prior exam allowing for differences in radiographic technique different degrees of magnification.  There is a small amount of air in a nondistended colon.  IMPRESSION: 1. Persistent high-grade partial small bowel obstruction. No significant change from the previous day's study.   Electronically Signed   By:  Amie Portland M.D.   On: 03/23/2015 10:01   Dg Abd Portable 1v  03/22/2015   CLINICAL DATA:  81 year old female with recent small bowel obstruction. Initial encounter.  EXAM: PORTABLE ABDOMEN - 1 VIEW  COMPARISON:  03/21/2015 and earlier, including CT Abdomen and Pelvis 03/18/2015.  FINDINGS: Portable AP supine view at 0517 hrs. Enteric tube has been removed. Recurrent dilated gas-filled small bowel loops in the mid abdomen up to 41 mm diameter. Abundant stool now at the splenic flexure. Decreased distal colon gas in the pelvis. Stable cholecystectomy clips. Stable abdominal and pelvic visceral contours. Grossly stable lung bases. Stable visualized osseous structures.  IMPRESSION: NG tube removed with recurrent small bowel obstruction.   Electronically Signed   By: Odessa Fleming M.D.   On: 03/22/2015 07:42   Dg Abd Portable 1v  03/21/2015   CLINICAL DATA:  Small-bowel obstruction .  EXAM: PORTABLE ABDOMEN - 1 VIEW  COMPARISON:  03/20/2015 .  FINDINGS: NG tube noted with tip in the upper portion stomach. Slight advancement should be considered . Surgical clips right upper quadrant Soft tissue structures are unremarkable. Small-bowel distention has improved. No free air. Stool noted throughout the colon. No acute bony abnormality.  IMPRESSION: 1. NG tube noted with tip in the upper portion of the stomach. Advancement suggested.  2. Interim improvement of small bowel distention. Colonic gas pattern is normal with stool throughout the colon.  These results will be called to the ordering clinician or representative by the Radiologist Assistant, and communication documented in the PACS or zVision Dashboard.   Electronically Signed   By: Maisie Fus  Register   On: 03/21/2015 07:32   Dg Abd Portable 1v  03/20/2015   CLINICAL DATA:  Followup small bowel obstruction.  EXAM: PORTABLE ABDOMEN - 1 VIEW  COMPARISON:  03/19/2015  FINDINGS: Mild small bowel dilation persists most evident in the left upper quadrant. There has been no  substantial change from the prior study. Residual contrast is noted in the bladder, also similar to the prior exam. Nasogastric tube tip projects in the distal stomach.  IMPRESSION: Small bowel dilation persists consistent with a persistent partial small bowel obstruction. No change from the previous day's study.   Electronically Signed   By: Amie Portland M.D.   On: 03/20/2015 07:29   Dg Abd Portable 1v  03/19/2015   CLINICAL DATA:  Small bowel obstruction protocol. Small bowel obstruction.  EXAM: PORTABLE ABDOMEN - 1 VIEW  COMPARISON:  03/18/2015.  FINDINGS: This is a 24 hr film. There is no contrast identified in the cecum. On the prior exam at 2046 hr yesterday, oral contrast was present in the stomach. Excreted contrast is present within the urinary bladder. Dilation of small bowel loops measures 36 mm. Cholecystectomy clips are present in the right upper quadrant.  There is less small bowel contrast than expected which may be due to dilution in the proximal small bowel. Nasogastric tube remains in the stomach with the tip at the antrum.  Large amount of stool is present in the colon.  IMPRESSION: Small bowel obstruction with no contrast identified in the cecum at 24 hr.   Electronically Signed  By: Andreas Newport M.D.   On: 03/19/2015 17:11   Dg Abd Portable 1v-small Bowel Obstruction Protocol-initial, 8 Hr Delay  03/19/2015   CLINICAL DATA:  Small bowel obstruction  EXAM: PORTABLE ABDOMEN - 1 VIEW  COMPARISON:  Radiographs and CT 03/18/2015  FINDINGS: Nasogastric tube extends into the distal stomach. There is contrast in the gastric lumen. There is no significant contrast in the small bowel or colon. Dilated stacked loops of small bowel persist in the mid abdomen, probably unchanged. No free air is evident on this single AP supine portable radiograph.  IMPRESSION: Enteric contrast has not reached the colon. Persistent small bowel dilatation consistent with small-bowel obstruction.   Electronically  Signed   By: Ellery Plunk M.D.   On: 03/19/2015 01:48   Dg Abd Portable 1v-small Bowel Protocol-position Verification  03/18/2015   CLINICAL DATA:  Small bowel protocol, nasogastric tube placement  EXAM: PORTABLE ABDOMEN - 1 VIEW  COMPARISON:  Portable exam 1303 hours compared to CT abdomen and pelvis 03/18/2015  FINDINGS: Tip of nasogastric tube projects over distal gastric antrum or duodenal bulb, patient slightly rotated to the RIGHT.  Excreted contrast material within renal collecting systems and distended urinary bladder.  Air-filled loops of dilated small bowel noted in the mid abdomen suspicious for small bowel obstruction.  Some gas and stool remain present within the colon.  Degenerative disc and facet disease changes thoracolumbar spine.  Osseous demineralization.  IMPRESSION: Tip of nasogastric tube projects over either the distal gastric antrum or the duodenal bulb region.  Dilated small bowel loops question small bowel obstruction.   Electronically Signed   By: Ulyses Southward M.D.   On: 03/18/2015 13:16   Dg Foot 2 Views Right  03/22/2015   CLINICAL DATA:  Dorsal right foot pain for 4 days, no known injury, initial encounter  EXAM: RIGHT FOOT - 2 VIEW  COMPARISON:  None.  FINDINGS: Degenerative changes are noted in the tarsal bones. No acute fracture or dislocation is noted. A small calcaneal spur is noted.  IMPRESSION: Degenerative change without acute abnormality.   Electronically Signed   By: Alcide Clever M.D.   On: 03/22/2015 14:57   Dg Swallowing Func-speech Pathology  04/01/2015    Objective Swallowing Evaluation:    Patient Details  Name: [name] MRN: [id number] Date of Birth: 05/12/1933  Today's Date: 04/01/2015 Time: SLP Start Time (ACUTE ONLY): 0835-SLP Stop Time (ACUTE ONLY): 0857 SLP Time Calculation (min) (ACUTE ONLY): 22 min  Past Medical History:  Past Medical History  Diagnosis Date  . CAD (coronary artery disease)     s/p stenting of LAD 1999- cath 5-08 EF normal LAD  30-40% restenosis. D1  50% D2 80% LCX & RCA minimal plaque  . HTN (hypertension)   . Hyperlipemia   . Anemia     iron deficiency  . Depression   . DVT (deep venous thrombosis)   . Gout   . Osteoporosis   . Pancreatitis   . GERD (gastroesophageal reflux disease)   . Renal insufficiency     Cr 1.2-1.3  . Obesity   . Diabetes mellitus   . Polyarthritis     DJD/ possible PMR  . Vitamin D deficiency   . B12 deficiency   . Tinnitus   . Anxiety   . Aneurysm, thoracic aortic   . Constipation   . Urinary frequency   . Vertigo   . Chronic back pain    Past Surgical History:  Past Surgical History  Procedure Laterality Date  . Hemorrhoid surgery    . Abdominal hysterectomy    . Cholecystectomy    . Tubal ligation    . Coronary angioplasty with stent placement  1999    LAD stent  . Cardiac catheterization  2008    L main 20%, LAD stent patent, D1 50%, D2 80% (small), RCA 20%, EF 55-60%   . Laparoscopy N/A 03/24/2015    Procedure: LAPAROSCOPY DIAGNOSTIC;  Surgeon: Luretha Murphy, MD;   Location: [facility];  Service: General;  Laterality: N/A;  . Laparoscopic lysis of adhesions N/A 03/24/2015    Procedure: LAPAROSCOPIC LYSIS OF ADHESIONS;  Surgeon: Luretha Murphy,  MD;  Location: [facility];  Service: General;  Laterality: N/A;  . Laparotomy N/A 03/24/2015    Procedure: LAPAROTOMY with decompression of bowel;  Surgeon: Luretha Murphy, MD;  Location: [facility];  Service: General;  Laterality: N/A;   HPI:  Other Pertinent Information: Pt is a 81 year old female admitted 03/18/15  with SBO secondary to adhesions. She failed conservative therapy and  underwent laparoscopic lysis of adhesions/laparotomy with bowel  decompression on 03/24/15. Her hospital course was complicated by sepsis  secondary to peritoneal contamination with transfer to the SDU on 03/25/15.  She also developed left-sided facial droop/right arm weakness 03/27/15 with  CT scan showing a 4 mm lacunar infarct in the left lentiform nucleus. MRI  failed to show any abnormality. After  removal of NG for suction, MD  ordered MBS prior to initiating diet.   No Data Recorded  Assessment / Plan / Recommendation CHL IP CLINICAL IMPRESSIONS 04/01/2015  Therapy Diagnosis Mild pharyngeal phase dysphagia;Mild oral phase  dysphagia  Clinical Impression Mild oral dysphagia characterized by decreased lingual  formation of bolus, improved with straw sips and improving progressively  during exam. oropharyngeal dysphagia also mild with no penetration or  aspiration. Suspect mild standing secretions intially during exam as  pharyngeal residuals decreased and timing of swallow improved with each  trial. Though swallow was initially delayed, by end of exam pt taking  large consecutive sips of thin with minimal delay and minimal residuals.  Recommend pt initiate a dys 3 (mechanical soft) diet with basic aspiration  precautions of upright posture and occasional second swallow to clear oral  cavity and orophrayngeal residuals. SLP will f/u for tolerance during  acute stay.       [facility] IP TREATMENT RECOMMENDATION 04/01/2015  Treatment Recommendations Therapy as outlined in treatment plan below     [facility] IP DIET RECOMMENDATION 04/01/2015  SLP Diet Recommendations Dysphagia 3 (Mech soft);Thin  Liquid Administration via (None)  Medication Administration Whole meds with liquid  Compensations Multiple dry swallows after each bite/sip  Postural Changes and/or Swallow Maneuvers (None)     CH[facility] IP OTHER RECOMMENDATIONS 04/01/2015  Recommended Consults (None)  Oral Care Recommendations Oral care BID  Other Recommendations (None)     [facility] IP FOLLOW UP RECOMMENDATIONS 03/29/2015  Follow up Recommendations 24 hour supervision/assistance     CHL IP FREQUENCY AND DURATION 04/01/2015  Speech Therapy Frequency (ACUTE ONLY) min 2x/week  Treatment Duration 2 weeks     Pertinent Vitals/Pain NA    SLP Swallow Goals No flowsheet data found.  No flowsheet data found.    [facility] IP REASON FOR REFERRAL 04/01/2015  Reason for Referral Objectively evaluate  swallowing function     [facility]L IP ORAL PHASE 04/01/2015  Lips (None)  Tongue (None)  Mucous membranes (None)  Nutritional status (None)  Other (None)  Oxygen  therapy (None)  Oral Phase Impaired  Oral - Pudding Teaspoon (None)  Oral - Pudding Cup (None)  Oral - Honey Teaspoon (None)  Oral - Honey Cup (None)  Oral - Honey Syringe (None)  Oral - Nectar Teaspoon (None)  Oral - Nectar Cup (None)  Oral - Nectar Straw (None)  Oral - Nectar Syringe (None)  Oral - Ice Chips (None)  Oral - Thin Teaspoon (None)  Oral - Thin Cup (None)  Oral - Thin Straw (None)  Oral - Thin Syringe (None)  Oral - Puree (None)  Oral - Mechanical Soft (None)  Oral - Regular (None)  Oral - Multi-consistency (None)  Oral - Pill (None)  Oral Phase - Comment (None)      CHL IP PHARYNGEAL PHASE 04/01/2015  Pharyngeal Phase Impaired  Pharyngeal - Pudding Teaspoon (None)  Penetration/Aspiration details (pudding teaspoon) (None)  Pharyngeal - Pudding Cup (None)  Penetration/Aspiration details (pudding cup) (None)  Pharyngeal - Honey Teaspoon (None)  Penetration/Aspiration details (honey teaspoon) (None)  Pharyngeal - Honey Cup (None)  Penetration/Aspiration details (honey cup) (None)  Pharyngeal - Honey Syringe (None)  Penetration/Aspiration details (honey syringe) (None)  Pharyngeal - Nectar Teaspoon (None)  Penetration/Aspiration details (nectar teaspoon) (None)  Pharyngeal - Nectar Cup (None)  Penetration/Aspiration details (nectar cup) (None)  Pharyngeal - Nectar Straw (None)  Penetration/Aspiration details (nectar straw) (None)  Pharyngeal - Nectar Syringe (None)  Penetration/Aspiration details (nectar syringe) (None)  Pharyngeal - Ice Chips (None)  Penetration/Aspiration details (ice chips) (None)  Pharyngeal - Thin Teaspoon (None)  Penetration/Aspiration details (thin teaspoon) (None)  Pharyngeal - Thin Cup (None)  Penetration/Aspiration details (thin cup) (None)  Pharyngeal - Thin Straw (None)  Penetration/Aspiration details (thin straw) (None)   Pharyngeal - Thin Syringe (None)  Penetration/Aspiration details (thin syringe') (None)  Pharyngeal - Puree (None)  Penetration/Aspiration details (puree) (None)  Pharyngeal - Mechanical Soft (None)  Penetration/Aspiration details (mechanical soft) (None)  Pharyngeal - Regular (None)  Penetration/Aspiration details (regular) (None)  Pharyngeal - Multi-consistency (None)  Penetration/Aspiration details (multi-consistency) (None)  Pharyngeal - Pill (None)  Penetration/Aspiration details (pill) (None)  Pharyngeal Comment (None)      No flowsheet data found.  No flowsheet data found.        Harlon Ditty, [place] CCC-SLP [phone]  Claudine Mouton 04/01/2015, 10:31 AM      Medical Consultants:    Surgery - Dr. Glenna Fellows   Cardiology   PCCM  Neurology - Dr. Thana Farr   Anti-Infectives:    Mefoxin 03/25/2015 --> 03/26/2015  Rocephin 03/26/2015 -->   Flagyl 03/26/2015 -->  Subjective:   [name] is still needing frequent pain meds.  She has not eaten very much, just a few bites of oatmeal.  No diarrhea.  Lethargic this morning.  Says "everything tastes nasty" when asked about her appetite.  Objective:    Filed Vitals:   04/02/15 0008 04/02/15 0241 04/02/15 0515 04/02/15 0557  BP: 144/61 154/70 161/73 151/60  Pulse: 98 100 103 96  Temp: 99 F (37.2 C) 100 F (37.8 C) 98.6 F (37 C) 98.6 F (37 C)  TempSrc: Oral Oral Oral Oral  Resp: Height:      Weight:      SpO2: 100% 100% 100% 100%    Intake/Output Summary (Last 24 hours) at 04/02/15 0747 Last data filed at 04/02/15 0559  Gross per 24 hour  Intake 840.83 ml  Output   2025 ml  Net -1184.17 ml    Exam:  Gen:  NAD Cardiovascular:  RRR, No M/R/G Respiratory:  Lungs CTAB Gastrointestinal:  Abdomen soft, tender, + BS Extremities:  1+ edema   Data Reviewed:    Labs: Basic Metabolic Panel:  Recent Labs Lab 03/27/15 0404 03/28/15 0357 03/29/15 0528 03/31/15 0350  04/01/15 0450 04/02/15 0537  NA 132* 134* 133* 136 135 135  K 4.1 2.8* 3.7 4.8 5.1 5.8*  CL 99* 100* 104 102 104 103  CO2 22 23 23 25 24 25   GLUCOSE 117* 153* 149* 176* 185* 163*  BUN 45* 49* 51* 48* 53* 48*  CREATININE 1.56* 1.28* 1.08* 0.98 0.89 0.80  CALCIUM 8.2* 8.1* 7.7* 8.2* 8.0* 8.5*  MG 2.1 1.7 1.9 1.7 1.8 1.7  PHOS 1.7* 3.1  --  4.5 4.7* 4.6   GFR Estimated Creatinine Clearance: 53.9 mL/min (by C-G formula based on Cr of 0.8). Liver Function Tests:  Recent Labs Lab 03/28/15 0357 03/29/15 0528 03/31/15 0350  AST 38 33 26  ALT 18 18 14   ALKPHOS 86 98 98  BILITOT 0.2* 0.4 0.4  PROT 5.6* 5.6* 5.9*  ALBUMIN 1.9* 1.7* 1.8*   Coagulation profile  Recent Labs Lab 03/27/15 2348  INR 1.06    CBC:  Recent Labs Lab 03/28/15 0357 04/01/15 0450 04/02/15 0537  WBC 15.4* 14.8* 16.9*  NEUTROABS 14.3*  --   --   HGB 8.4* 6.4* 9.1*  HCT 24.6* 19.5* 28.7*  MCV 86.6 89.9 82.0  PLT 131* 297 364   Cardiac Enzymes: No results for input(s): CKTOTAL, CKMB, CKMBINDEX, TROPONINI in the last 168 hours. CBG:  Recent Labs Lab 04/01/15 0510 04/01/15 0802 04/01/15 1200 04/01/15 1722 04/01/15 2357  GLUCAP 181* 132* 216* 138* 166*   Sepsis Labs:  Recent Labs Lab 03/27/15 0404 03/28/15 0357 04/01/15 0450 04/02/15 0537  PROCALCITON 42.12  --   --   --   WBC  --  15.4* 14.8* 16.9*   Microbiology Recent Results (from the past 240 hour(s))  MRSA PCR Screening     Status: None   Collection Time: 03/24/15  8:52 AM  Result Value Ref Range Status   MRSA by PCR NEGATIVE NEGATIVE Final    Comment:        The GeneXpert MRSA Assay (FDA approved for NASAL specimens only), is one component of a comprehensive MRSA colonization surveillance program. It is not intended to diagnose MRSA infection nor to guide or monitor treatment for MRSA infections.   Culture, blood (x 2)     Status: None   Collection Time: 03/25/15  9:30 AM  Result Value Ref Range Status    Specimen Description BLOOD RIGHT HAND  Final   Special Requests BOTTLES DRAWN AEROBIC ONLY 5CC  Final   Culture   Final    NO GROWTH 5 DAYS Performed at [facility]    Report Status 03/31/2015 FINAL  Final  Culture, blood (x 2)     Status: None   Collection Time: 03/25/15  9:35 AM  Result Value Ref Range Status   Specimen Description BLOOD RIGHT ARM  Final   Special Requests BOTTLES DRAWN AEROBIC AND ANAEROBIC 6CC  Final   Culture   Final    NO GROWTH 5 DAYS Performed at [facility]    Report Status 03/31/2015 FINAL  Final  Culture, Urine     Status: None   Collection Time: 03/25/15 11:57 AM  Result Value Ref Range Status   Specimen Description URINE, CATHETERIZED  Final   Special Requests NONE  Final   Colony Count NO GROWTH Performed at [facility]   Final   Culture NO GROWTH Performed at [facility]   Final   Report Status 03/26/2015 FINAL  Final     Medications:   . antiseptic oral rinse  7 mL Mouth Rinse q12n4p  . aspirin  300 mg Rectal Daily   Or  . aspirin  325 mg Oral Daily  . bisacodyl  10 mg Rectal Daily  . chlorhexidine  15 mL Mouth Rinse BID  . feeding supplement (ENSURE ENLIVE)  237 mL Oral BID BM  . insulin aspart  0-15 Units Subcutaneous 4 times per day  . lip balm  1 application Topical BID  . metoprolol  2.5 mg Intravenous 4 times per day   Continuous Infusions: . sodium chloride 10 mL/hr (03/29/15 0800)  . Marland KitchenTPN (CLINIMIX-E) Adult 70 mL/hr at 04/01/15 1807   And  . fat emulsion 240 mL (04/01/15 1807)    Time spent: 35 minutes with > 50% of time discussing current diagnostic test results, clinical impression and plan of care.   LOS: 15 days   RAMA,CHRISTINA  [facility] Pager [phone]. If unable to reach me by pager, please call my cell phone at [phone].  *Please refer to [email or web address], password [email or web address] to get updated schedule on who will round on this patient, as hospitalists switch teams weekly. If  7PM-7AM, please contact night-coverage at [email or web address], password [email or web address] for any overnight needs.  04/02/2015, 7:47 AM

## 2015-04-03 ENCOUNTER — Inpatient Hospital Stay (HOSPITAL_COMMUNITY): Payer: Commercial Managed Care - HMO

## 2015-04-03 ENCOUNTER — Encounter (HOSPITAL_COMMUNITY): Payer: Self-pay | Admitting: Radiology

## 2015-04-03 LAB — PROTIME-INR
INR: 1.22 (ref 0.00–1.49)
PROTHROMBIN TIME: 15.5 s — AB (ref 11.6–15.2)

## 2015-04-03 LAB — BASIC METABOLIC PANEL
ANION GAP: 8 (ref 5–15)
BUN: 45 mg/dL — AB (ref 6–20)
CALCIUM: 8.3 mg/dL — AB (ref 8.9–10.3)
CHLORIDE: 100 mmol/L — AB (ref 101–111)
CO2: 24 mmol/L (ref 22–32)
Creatinine, Ser: 0.75 mg/dL (ref 0.44–1.00)
GFR calc non Af Amer: >60 mL/min (ref >60)
Glucose, Bld: 195 mg/dL — ABNORMAL HIGH (ref 65–99)
POTASSIUM: 4.9 mmol/L (ref 3.5–5.1)
SODIUM: 132 mmol/L — AB (ref 135–145)

## 2015-04-03 LAB — GLUCOSE, CAPILLARY
GLUCOSE-CAPILLARY: 194 mg/dL — AB (ref 65–99)
GLUCOSE-CAPILLARY: 124 mg/dL — AB (ref 65–99)
Glucose-Capillary: 156 mg/dL — ABNORMAL HIGH (ref 65–99)
Glucose-Capillary: 168 mg/dL — ABNORMAL HIGH (ref 65–99)
Glucose-Capillary: 175 mg/dL — ABNORMAL HIGH (ref 65–99)
Glucose-Capillary: 182 mg/dL — ABNORMAL HIGH (ref 65–99)
Glucose-Capillary: 203 mg/dL — ABNORMAL HIGH (ref 65–99)

## 2015-04-03 LAB — CBC
HEMATOCRIT: 29.3 % — AB (ref 36.0–46.0)
HEMOGLOBIN: 9 g/dL — AB (ref 12.0–15.0)
MCH: 25.1 pg — ABNORMAL LOW (ref 26.0–34.0)
MCHC: 30.7 g/dL (ref 30.0–36.0)
MCV: 81.6 fL (ref 78.0–100.0)
PLATELETS: 370 10*3/uL (ref 150–400)
RBC: 3.59 MIL/uL — ABNORMAL LOW (ref 3.87–5.11)
RDW: 22.8 % — ABNORMAL HIGH (ref 11.5–15.5)
WBC: 16.9 10*3/uL — ABNORMAL HIGH (ref 4.0–10.5)

## 2015-04-03 MED ORDER — FENTANYL CITRATE (PF) 100 MCG/2ML IJ SOLN
INTRAMUSCULAR | Status: AC
  Filled 2015-04-03: qty 4

## 2015-04-03 MED ORDER — FAT EMULSION 20 % IV EMUL
240.0000 mL | INTRAVENOUS | Status: AC
  Administered 2015-04-03: 240 mL via INTRAVENOUS
  Filled 2015-04-03: qty 250

## 2015-04-03 MED ORDER — INSULIN ASPART 100 UNIT/ML ~~LOC~~ SOLN
0.0000 [IU] | SUBCUTANEOUS | Status: AC
  Administered 2015-04-03 – 2015-04-04 (×5): 3 [IU] via SUBCUTANEOUS
  Administered 2015-04-04 (×2): 2 [IU] via SUBCUTANEOUS
  Administered 2015-04-04 – 2015-04-05 (×3): 3 [IU] via SUBCUTANEOUS
  Administered 2015-04-05: 2 [IU] via SUBCUTANEOUS
  Administered 2015-04-05: 0 [IU] via SUBCUTANEOUS
  Administered 2015-04-05 – 2015-04-06 (×3): 2 [IU] via SUBCUTANEOUS
  Administered 2015-04-06: 3 [IU] via SUBCUTANEOUS
  Administered 2015-04-06: 2 [IU] via SUBCUTANEOUS
  Administered 2015-04-06 – 2015-04-07 (×3): 3 [IU] via SUBCUTANEOUS
  Administered 2015-04-07: 8 [IU] via SUBCUTANEOUS
  Administered 2015-04-08: 3 [IU] via SUBCUTANEOUS
  Administered 2015-04-08 – 2015-04-09 (×4): 2 [IU] via SUBCUTANEOUS
  Administered 2015-04-10 – 2015-04-11 (×5): 3 [IU] via SUBCUTANEOUS
  Administered 2015-04-11: 5 [IU] via SUBCUTANEOUS
  Administered 2015-04-11: 2 [IU] via SUBCUTANEOUS
  Administered 2015-04-12: 3 [IU] via SUBCUTANEOUS
  Administered 2015-04-12 (×3): 2 [IU] via SUBCUTANEOUS
  Administered 2015-04-13 (×2): 3 [IU] via SUBCUTANEOUS
  Administered 2015-04-13 – 2015-04-14 (×3): 2 [IU] via SUBCUTANEOUS
  Administered 2015-04-14: 3 [IU] via SUBCUTANEOUS
  Administered 2015-04-14: 2 [IU] via SUBCUTANEOUS
  Administered 2015-04-14: 3 [IU] via SUBCUTANEOUS
  Administered 2015-04-15: 2 [IU] via SUBCUTANEOUS
  Administered 2015-04-15: 3 [IU] via SUBCUTANEOUS
  Administered 2015-04-16 (×2): 2 [IU] via SUBCUTANEOUS
  Administered 2015-04-16: 3 [IU] via SUBCUTANEOUS
  Administered 2015-04-16 – 2015-04-17 (×3): 2 [IU] via SUBCUTANEOUS
  Administered 2015-04-17: 3 [IU] via SUBCUTANEOUS

## 2015-04-03 MED ORDER — MIDAZOLAM HCL 2 MG/2ML IJ SOLN
INTRAMUSCULAR | Status: AC | PRN
  Administered 2015-04-03: 0.5 mg via INTRAVENOUS
  Administered 2015-04-03: 1 mg via INTRAVENOUS
  Administered 2015-04-03 (×5): 0.5 mg via INTRAVENOUS

## 2015-04-03 MED ORDER — MIDAZOLAM HCL 2 MG/2ML IJ SOLN
INTRAMUSCULAR | Status: AC
  Filled 2015-04-03: qty 6

## 2015-04-03 MED ORDER — FENTANYL CITRATE (PF) 100 MCG/2ML IJ SOLN
INTRAMUSCULAR | Status: AC | PRN
  Administered 2015-04-03 (×4): 25 ug via INTRAVENOUS

## 2015-04-03 MED ORDER — TRACE MINERALS CR-CU-MN-SE-ZN 10-1000-500-60 MCG/ML IV SOLN
INTRAVENOUS | Status: AC
  Administered 2015-04-03: 18:00:00 via INTRAVENOUS
  Filled 2015-04-03: qty 1680

## 2015-04-03 NOTE — Procedures (Signed)
CT guided drainage of two intra-abdominal fluid collections.  10 French drain placed in left upper abdomen collection, adjacent to the spleen.  10 French drain placed in left lateral abdominal collection.  Cloudy yellow fluid removed from both drains.  Minimal blood loss.  No immediate complication.

## 2015-04-03 NOTE — Progress Notes (Addendum)
Patient ID: [name], female   DOB: [birth year], 81 y.o.   MRN: [id number] 10 Days Post-Op  Subjective: Daughter at bedside.  Pt remains lethargic, does not want to participate in therapy or to eat. Having some generalized pain all over. He has had several bowel movements and no nausea or vomiting  Objective: Vital signs in last 24 hours: Temp:  [98.2 F (36.8 C)-99.6 F (37.6 C)] 98.2 F (36.8 C) (05/22 0519) Pulse Rate:  [92-103] 93 (05/22 0546) Resp:  [18-20] 18 (05/22 0519) BP: (134-159)/(63-79) 151/71 mmHg (05/22 0519) SpO2:  [99 %-100 %] 99 % (05/22 0519) Last BM Date: 04/01/15  Intake/Output from previous day: 05/21 0701 - 05/22 0700 In: 2936 [P.O.:110; I.V.:140; SLP:5300] Out: 2325 [Urine:2325] Intake/Output this shift: Total I/O In: 60 [P.O.:60] Out: -   General appearance: responsive and answers questions but lethargic GI: mild tenderness which continues to improve.  Nondistended Incision/Wound: clean and dry without evidence of infection  Lab Results:   Recent Labs  04/02/15 0537 04/03/15 0545  WBC 16.9* 16.9*  HGB 9.1* 9.0*  HCT 28.7* 29.3*  PLT 364 370   BMET  Recent Labs  04/02/15 0537 04/02/15 1201 04/03/15 0545  NA 135  --  132*  K 5.8* 5.7* 4.9  CL 103  --  100*  CO2 25  --  24  GLUCOSE 163*  --  195*  BUN 48*  --  45*  CREATININE 0.80  --  0.75  CALCIUM 8.5*  --  8.3*     Studies/Results: Ct Abdomen Pelvis W Contrast  04/02/2015   CLINICAL DATA:  Nonspecific abdominal pain. Status post recent laparotomy for small bowel obstruction.  EXAM: CT ABDOMEN AND PELVIS WITH CONTRAST  TECHNIQUE: Multidetector CT imaging of the abdomen and pelvis was performed using the standard protocol following bolus administration of intravenous contrast.  CONTRAST:  57mL OMNIPAQUE IOHEXOL 300 MG/ML SOLN, OMNIPAQUE IOHEXOL 300 MG/ML SOLN  COMPARISON:  Renal ultrasound dated 03/25/2015 and abdomen and pelvis CT dated 03/18/2015.  FINDINGS:  Cholecystectomy clips. Interval midline surgical scar in the skin clips at the level of the pelvis. Loculated fluid with peripheral rim enhancement beneath the left hemidiaphragm and partially surrounding the spleen. This measures 7.7 x 5.3 cm on axial image 16. This measures 7.7 cm in length on coronal image number 92.  There is an additional fluid collection in the lateral aspect of the left mid abdomen, measuring 8.3 x 4.2 cm on axial image number 31. This also has peripheral rim enhancement. This measures 9.8 cm in length on coronal image number 64.  There are dilated small bowel loops in the lower abdomen and upper pelvis. Above the urinary bladder, one of these loops contains fluid, small amount of oral contrast and some gas. No definite separate fluid collection is seen.  A Foley catheter is in the urinary bladder with associated air in the bladder. No free peritoneal air seen. Atheromatous arterial calcifications, including the coronary arteries. Cholecystectomy clips.  Small left pleural effusion. Bilateral lower lobe atelectasis. Multiple colonic diverticula. Small left inguinal hernia containing fat. Diffuse subcutaneous edema. Lumbar and lower thoracic spine degenerative changes and scoliosis.  IMPRESSION: 1. 7.7 x 7.7 x 5.3 cm fluid collection with rim enhancement beneath the left hemidiaphragm and partially surrounding the spleen. This is suspicious for an abscess. 2. 9.8 x 8.3 x 4.2 cm arm left mid abdominal fluid collection with peripheral rim enhancement, suspicious for an abscess. 3. Focal ileus or partial obstruction involving  small bowel in the inferior abdomen and upper pelvis. 4. Small left pleural effusion. 5. Bilateral lower lobe atelectasis. 6. Colonic diverticulosis.   Electronically Signed   By: Beckie Salts M.D.   On: 04/02/2015 17:38    Anti-infectives: Anti-infectives    Start     Dose/Rate Route Frequency Ordered Stop   03/26/15 1600  cefTRIAXone (ROCEPHIN) 2 g in dextrose 5 % 50  mL IVPB - Premix    Comments:  Pharmacy may adjust dosing strength / duration / interval for maximal efficacy   2 g 100 mL/hr over 30 Minutes Intravenous Every 24 hours 03/26/15 1007 03/30/15 1535   03/26/15 1200  metroNIDAZOLE (FLAGYL) IVPB 500 mg     500 mg 100 mL/hr over 60 Minutes Intravenous Every 6 hours 03/26/15 1007 03/30/15 1850   03/25/15 1600  cefOXitin (MEFOXIN) 1 g in dextrose 5 % 50 mL IVPB  Status:  Discontinued     1 g 100 mL/hr over 30 Minutes Intravenous Every 12 hours 03/25/15 0801 03/26/15 1007   03/25/15 0800  cefOXitin (MEFOXIN) 1 g in dextrose 5 % 50 mL IVPB  Status:  Discontinued     1 g 100 mL/hr over 30 Minutes Intravenous 3 times per day 03/25/15 0752 03/25/15 0758   03/24/15 1700  cefOXitin (MEFOXIN) 1 g in dextrose 5 % 50 mL IVPB     1 g 100 mL/hr over 30 Minutes Intravenous Every 6 hours 03/24/15 1511 03/25/15 0512   03/24/15 0915  cefOXitin (MEFOXIN) 2 g in dextrose 5 % 50 mL IVPB    Comments:  Pharmacy may adjust dosing strength, interval, or rate of medication as needed for optimal therapy for the patient Send with patient on call to the OR.  Anesthesia to complete antibiotic administration <70min prior to incision per [facility].   2 g 100 mL/hr over 30 Minutes Intravenous On call to O.R. 03/24/15 0910 03/24/15 1042      Assessment/Plan: s/p Procedure(s): LAPAROSCOPY DIAGNOSTIC LAPAROSCOPIC LYSIS OF ADHESIONS LAPAROTOMY with decompression of bowel Mild postop CVA Very slow progression. Has persistent leukocytosis of uncertain source. Bowel function appears normal but poor appetite. Consider gradual weaning of TNA to see if this stimulates her appetite. Needs continued effort at physical therapy and mobilization. Discussed with daughter and all questions answered.  Note: CT scan was performed last night which I was initially not aware. I have reviewed this which shows 2 discrete fluid collections which appear amenable to percutaneous drainage. This  could explain her ongoing symptoms. I discussed this with her daughter and all questions were answered. Interventional radiology has evaluated the patient and is planning percutaneous drainage.  LOS: 16 days    Lonna Rabold T 04/03/2015

## 2015-04-03 NOTE — Progress Notes (Addendum)
Progress Note   [name] OHF:[id number] DOB: 1933/11/04 DOA: 03/18/2015 PCP: [name], MD   Brief Narrative:   [name] is an 81 y.o. female with a PMH of hypertension, CAD, hyperlipidemia and diabetes who was admitted 03/18/15 with SBO secondary to adhesions. She failed conservative therapy and underwent laparoscopic lysis of adhesions/laparotomy with bowel decompression on 03/24/15. Her hospital course was complicated by sepsis secondary to peritoneal contamination with transfer to the SDU on 03/25/15. She also developed left-sided facial droop/right arm weakness 03/27/15 with CT scan showing a 4 mm lacunar infarct in the left lentiform nucleus. MRI failed to show any abnormality. Neurology is following. Barrier to discharge his ongoing need for TPN for nutritional purposes.  Assessment/Plan:   Principal Problem:   Small bowel obstruction s/p exlap/LOA/decompression 03/25/2015 complicated by severe sepsis secondary to peritonitis / abdominal pain now complicated by intra-abdominal abscess/fluid collection - Status post laparoscopic lysis of adhesions/laparotomy with bowel decompression 03/24/15. - Subsequently developed acute peritonitis/sepsis. - Initially treated with Mefoxin starting 03/25/15. Antibiotics switched to Flagyl/Rocephin 03/26/15.   - Given rising WBC and ongoing high pain medication needs, CT scan of the abdomen/pelvis done which showed 2 large fluid collections. Requested IR consult for percutaneous drainage. - Has tolerated discontinuation of NG tube, diet advanced to dysphagia 04/01/15 but poor appetite.  Active Problems:   Facial droop / right arm weakness / possible acute CVA left lentiform nucleus - On 03/27/15, CT of the head done to evaluate symptoms showed an acute 4 mm lacunar infarct in the left lentiform nucleus. - Subsequent MRI negative, although clinical findings consistent with acute CVA. MRA negative for stenosis. - 2-D echo with normal EF, no  diastolic dysfunction noted. No intracardiac masses or thrombi. - Carotid Dopplers negative for significant stenosis (1-39% bilaterally). - Hemoglobin A1c 6.3%. Cholesterol 99. - Evaluated by neurology, no further stroke workup recommended. - Continue aspirin. - PT/OT.    Hypokalemia/hypomagnesemia - Monitor and replace electrolytes as needed.    Acute postoperative blood loss anemia - Initial hemoglobin 10.2, has dropped approximately 2 g postoperatively. - Hemoglobin 6.4 on recheck 04/01/15, given 2 units of PRBCs. Hemoglobin 9.0 today.    Diabetes type 2, controlled - Currently being managed with moderate scale SSI every 6 hours. CBGs 135-211. - Hemoglobin A1c 6.3%.    Anxiety state/Adjustment disorder with mixed anxiety and depressed mood - Mood has been stable.    Coronary atherosclerosis - Continue aspirin therapy.    Acute renal failure secondary to sepsis  - Resolved with IV fluids.    Right foot pain  - Radiographs negative for fracture. Degenerative changes noted.    Polymyalgia rheumatica/Chronic fatigue disorder - PT/OT.    Elevated blood pressure - Continue as needed Lopressor.    Malnutrition of moderate degree - Continue TPN for nutritional support. Can likely start to wean once diet advanced (NPO in anticipation of abscess drainage).    Acute delirium - Avoid sedating medications. Family requests patient does not get Ativan or Haldol.    Thrombocytopenia - Mild. Likely related to recent sepsis.    DVT Prophylaxis - Continue SCDs.  Code Status: Full. Family Communication: Daughter, at the bedside.   Disposition Plan: Home with daughter when diet advanced, difficult to predict due to long hospitalization, ongoing need for IV pain medication, now with new fluid collections needing to be drained.   IV Access:    PICC placed 03/23/15   Procedures and diagnostic studies:   Ct Head  Wo Contrast  03/27/2015   CLINICAL DATA:  RIGHT-sided facial droop.  Symptoms began earlier today. Altered mental status.  EXAM: CT HEAD WITHOUT CONTRAST  TECHNIQUE: Contiguous axial images were obtained from the base of the skull through the vertex without intravenous contrast.  COMPARISON:  05/27/2014.  FINDINGS: Possible acute 4 mm lacune, LEFT lentiform nucleus as seen on image 12 series 5. This was not clearly present on in 2015 scan.  No other areas of concern for cortical or subcortical infarction.  No hemorrhage, mass lesion, hydrocephalus, or extra-axial fluid.  Cerebral and cerebellar atrophy. Generalized white matter hypoattenuation, likely small vessel disease. Than the calvarium intact. Vascular calcification. No sinus or mastoid disease. Dense lenticular opacities.  IMPRESSION: Possible acute 4 mm lacunar infarct LEFT lentiform nucleus. If further investigation desired, and no contraindications, consider MRI brain.   Electronically Signed   By: Davonna Belling M.D.   On: 03/27/2015 16:25   Mr Maxine Glenn Head Wo Contrast  03/28/2015   CLINICAL DATA:  81 year old with right-sided facial droop. Altered mental status. Subsequent encounter.  EXAM: MRI HEAD WITHOUT CONTRAST  MRA HEAD WITHOUT CONTRAST  TECHNIQUE: Multiplanar, multiecho pulse sequences of the brain and surrounding structures were obtained without intravenous contrast. Angiographic images of the head were obtained using MRA technique without contrast.  COMPARISON:  03/27/2015 head CT.  FINDINGS: MRI HEAD FINDINGS  No acute infarct.  No intracranial hemorrhage.  Remote tiny infarct right cerebellum. Minimal small vessel disease type changes.  Incidentally noted is a prominent peri vascular space left lenticular nucleus.  Global atrophy without hydrocephalus.  No intracranial mass lesion noted on this unenhanced exam.  Cervical medullary junction unremarkable. Mild spinal stenosis C3-4.  Expanded partially empty sella without other findings of pseudotumor cerebri  Pineal region unremarkable.  No intracranial mass lesion  noted on this unenhanced exam.  MRA HEAD FINDINGS  Mild narrowing pre cavernous/ cavernous segment right internal carotid artery.  Mild ectasia left internal carotid artery cavernous segment. The minimal bulge along the left internal carotid artery cavernous segment most likely related to slight ectasia of the left ophthalmic artery origin rather than small aneurysm. Mild motion degradation limits evaluation.  Slightly ectatic anterior communicating artery without aneurysm.  Middle cerebral artery mild branch vessel irregularity bilaterally.  No significant stenosis distal vertebral arteries or basilar artery. Regions of mild irregularity and slight narrowing.  IMPRESSION: MRI HEAD  No acute infarct.  Remote tiny infarct right cerebellum.  Incidentally noted is a prominent peri vascular space left lenticular nucleus.  Global atrophy without hydrocephalus.  Expanded partially empty sella without other findings of pseudotumor cerebri  MRA HEAD FINDINGS  No medium or large size vessel significant stenosis or occlusion. Evaluation of branch vessels slightly limited by motion degradation.   Electronically Signed   By: Lacy Duverney M.D.   On: 03/28/2015 12:02   Mr Brain Wo Contrast  03/28/2015   CLINICAL DATA:  81 year old with right-sided facial droop. Altered mental status. Subsequent encounter.  EXAM: MRI HEAD WITHOUT CONTRAST  MRA HEAD WITHOUT CONTRAST  TECHNIQUE: Multiplanar, multiecho pulse sequences of the brain and surrounding structures were obtained without intravenous contrast. Angiographic images of the head were obtained using MRA technique without contrast.  COMPARISON:  03/27/2015 head CT.  FINDINGS: MRI HEAD FINDINGS  No acute infarct.  No intracranial hemorrhage.  Remote tiny infarct right cerebellum. Minimal small vessel disease type changes.  Incidentally noted is a prominent peri vascular space left lenticular nucleus.  Global atrophy without hydrocephalus.  No intracranial mass lesion noted on  this unenhanced exam.  Cervical medullary junction unremarkable. Mild spinal stenosis C3-4.  Expanded partially empty sella without other findings of pseudotumor cerebri  Pineal region unremarkable.  No intracranial mass lesion noted on this unenhanced exam.  MRA HEAD FINDINGS  Mild narrowing pre cavernous/ cavernous segment right internal carotid artery.  Mild ectasia left internal carotid artery cavernous segment. The minimal bulge along the left internal carotid artery cavernous segment most likely related to slight ectasia of the left ophthalmic artery origin rather than small aneurysm. Mild motion degradation limits evaluation.  Slightly ectatic anterior communicating artery without aneurysm.  Middle cerebral artery mild branch vessel irregularity bilaterally.  No significant stenosis distal vertebral arteries or basilar artery. Regions of mild irregularity and slight narrowing.  IMPRESSION: MRI HEAD  No acute infarct.  Remote tiny infarct right cerebellum.  Incidentally noted is a prominent peri vascular space left lenticular nucleus.  Global atrophy without hydrocephalus.  Expanded partially empty sella without other findings of pseudotumor cerebri  MRA HEAD FINDINGS  No medium or large size vessel significant stenosis or occlusion. Evaluation of branch vessels slightly limited by motion degradation.   Electronically Signed   By: Lacy Duverney M.D.   On: 03/28/2015 12:02   Ct Abdomen Pelvis W Contrast  04/02/2015   CLINICAL DATA:  Nonspecific abdominal pain. Status post recent laparotomy for small bowel obstruction.  EXAM: CT ABDOMEN AND PELVIS WITH CONTRAST  TECHNIQUE: Multidetector CT imaging of the abdomen and pelvis was performed using the standard protocol following bolus administration of intravenous contrast.  CONTRAST:  98mL OMNIPAQUE IOHEXOL 300 MG/ML SOLN, OMNIPAQUE IOHEXOL 300 MG/ML SOLN  COMPARISON:  Renal ultrasound dated 03/25/2015 and abdomen and pelvis CT dated 03/18/2015.  FINDINGS:  Cholecystectomy clips. Interval midline surgical scar in the skin clips at the level of the pelvis. Loculated fluid with peripheral rim enhancement beneath the left hemidiaphragm and partially surrounding the spleen. This measures 7.7 x 5.3 cm on axial image 16. This measures 7.7 cm in length on coronal image number 92.  There is an additional fluid collection in the lateral aspect of the left mid abdomen, measuring 8.3 x 4.2 cm on axial image number 31. This also has peripheral rim enhancement. This measures 9.8 cm in length on coronal image number 64.  There are dilated small bowel loops in the lower abdomen and upper pelvis. Above the urinary bladder, one of these loops contains fluid, small amount of oral contrast and some gas. No definite separate fluid collection is seen.  A Foley catheter is in the urinary bladder with associated air in the bladder. No free peritoneal air seen. Atheromatous arterial calcifications, including the coronary arteries. Cholecystectomy clips.  Small left pleural effusion. Bilateral lower lobe atelectasis. Multiple colonic diverticula. Small left inguinal hernia containing fat. Diffuse subcutaneous edema. Lumbar and lower thoracic spine degenerative changes and scoliosis.  IMPRESSION: 1. 7.7 x 7.7 x 5.3 cm fluid collection with rim enhancement beneath the left hemidiaphragm and partially surrounding the spleen. This is suspicious for an abscess. 2. 9.8 x 8.3 x 4.2 cm arm left mid abdominal fluid collection with peripheral rim enhancement, suspicious for an abscess. 3. Focal ileus or partial obstruction involving small bowel in the inferior abdomen and upper pelvis. 4. Small left pleural effusion. 5. Bilateral lower lobe atelectasis. 6. Colonic diverticulosis.   Electronically Signed   By: Beckie Salts M.D.   On: 04/02/2015 17:38   Ct Abdomen Pelvis W Contrast  03/18/2015  CLINICAL DATA:  Nausea and vomiting for 4 days, anxiety, status post hysterectomy CT pelvis 05/27/2014  EXAM:  CT ABDOMEN AND PELVIS WITH CONTRAST  TECHNIQUE: Multidetector CT imaging of the abdomen and pelvis was performed using the standard protocol following bolus administration of intravenous contrast.  CONTRAST:  50mL OMNIPAQUE IOHEXOL 300 MG/ML SOLN, OMNIPAQUE IOHEXOL 300 MG/ML SOLN  COMPARISON:  None.  FINDINGS: Sagittal images of the spine shows diffuse osteopenia. Degenerative changes thoracolumbar spine. The lung bases are unremarkable.  The patient is status post cholecystectomy. Mild intrahepatic biliary ductal dilatation. There is CBD dilatation up to 1.1 cm. No focal hepatic mass. The pancreas, spleen and adrenal glands are unremarkable. Kidneys are symmetrical in size and enhancement. No hydronephrosis or hydroureter. Delayed renal images shows bilateral renal symmetrical excretion.  Atherosclerotic calcifications of abdominal aorta and iliac arteries. Moderate stool noted in right colon and proximal transverse colon. No colonic obstruction. The descending colon and sigmoid colon are empty collapsed. Some stool noted within rectum.  There are fluid distended small bowel loops in mid upper abdomen and left lower abdomen and pelvis. Small amount of fluid/stranding noted a adjacent to small bowel loops in left lower quadrant see axial image 57. Distal small bowel is decreased caliber collapsed. Findings are consistent with small bowel obstruction.  Mild distended urinary bladder. The patient is status post hysterectomy. The terminal ileum is small caliber decompressed. Contrast material is noted within stomach. No any contrast material is noted within small bowel or colon.  IMPRESSION: 1. There are fluid distended small bowel loops with multiple air-fluid levels highly suspicious for small bowel obstruction. Distal small bowel is small caliber decompressed. Terminal ileum is small caliber. Small amount of fluid/stranding noted adjacent to small bowel loops in left lower quadrant please see images 49 and 57.  2. Moderate stool noted in right colon and proximal transverse colon. Descending colon and sigmoid colon are empty collapsed. 3. No pericecal inflammation. 4. No hydronephrosis or hydroureter. 5. Status post cholecystectomy. Mild intrahepatic biliary ductal dilatation. CBD dilatation up to 1.1 cm. 6. No hydronephrosis or hydroureter. 7. Status post hysterectomy. These results were called by telephone at the time of interpretation on 03/18/2015 at 8:59 am to Dr. Gerhard Munch , who verbally acknowledged these results.   Electronically Signed   By: Natasha Mead M.D.   On: 03/18/2015 08:59   US Renal  03/25/2015   CLINICAL DATA:  Acute renal failure.  EXAM: RENAL / URINARY TRACT ULTRASOUND COMPLETE  COMPARISON:  None.  FINDINGS: Right Kidney:  Length: 8.1 cm. Increased echogenicity consistent with chronic medical renal disease. No mass or hydronephrosis visualized.  Left Kidney:  Length: 9.3 cm. Increased echogenicity consistent chronic medical renal disease. No mass or hydronephrosis visualized.  Bladder:  Bladder decompressed by Foley catheter.  IMPRESSION: Bilateral echodense kidneys consistent chronic medical renal disease. No acute abnormality. No hydronephrosis or bladder distention.   Electronically Signed   By: Maisie Fus  Register   On: 03/25/2015 13:27   Dg Chest Port 1 View  03/27/2015   CLINICAL DATA:  Left PICC placement. Nausea and vomiting. Initial encounter.  EXAM: PORTABLE CHEST - 1 VIEW  COMPARISON:  Chest radiograph performed 03/26/2015  FINDINGS: The patient's left PICC is noted ending about the proximal SVC. An enteric tube is noted extending below the diaphragm.  The lungs are hypoexpanded. Bibasilar and right upper lung zone airspace opacities may reflect atelectasis or pneumonia. No pleural effusion or pneumothorax is seen  The cardiomediastinal silhouette is  borderline normal in size. No acute osseous abnormalities are identified.  IMPRESSION: 1. Left PICC noted ending about the proximal SVC.  2. Lungs hypoexpanded. Bibasilar and right upper lung zone airspace opacities may reflect atelectasis or pneumonia.  These results were called by telephone at the time of interpretation on 03/27/2015 at 7:33 pm to Nursing in the [facility], who verbally acknowledged these results.   Electronically Signed   By: Roanna Raider M.D.   On: 03/27/2015 19:34   Dg Chest Port 1 View  03/26/2015   CLINICAL DATA:  Atelectasis  EXAM: PORTABLE CHEST - 1 VIEW  COMPARISON:  Yesterday  FINDINGS: NG tube and left PICC are stable. Upper normal heart size. Low volumes. Bibasilar atelectasis is not significantly changed. No pneumothorax.  IMPRESSION: Stable bibasilar atelectasis.   Electronically Signed   By: Jolaine Click M.D.   On: 03/26/2015 08:14   Dg Chest Port 1 View  03/25/2015   CLINICAL DATA:  Tachycardia and hypotension; concern for sepsis  EXAM: PORTABLE CHEST - 1 VIEW  COMPARISON:  May 27, 2014  FINDINGS: There is patchy atelectasis in both lung bases. The degree of inspiration is shallow. There is no frank airspace consolidation. Heart is upper normal in size with pulmonary vascularity within normal limits.  Central catheter tip is in the superior vena cava. Nasogastric tube tip and side port are in the distal stomach. No pneumothorax.  IMPRESSION: Bibasilar atelectasis. No pneumothorax. Degree of inspiration shallow.   Electronically Signed   By: Bretta Bang III M.D.   On: 03/25/2015 09:03   Dg Abd Portable 1v  03/24/2015   CLINICAL DATA:  Small-bowel obstruction  EXAM: PORTABLE ABDOMEN - 1 VIEW  COMPARISON:  03/23/2015  FINDINGS: NG tube is been placed with the tip in the gastric antrum  Small bowel dilatation shows mild interval improvement. Colon is decompressed with moderate stool in the right colon. Surgical clips in the gallbladder fossa.  IMPRESSION: Mild improvement in small bowel obstruction. NG tube in the gastric antrum.   Electronically Signed   By: Marlan Palau M.D.   On:  03/24/2015 08:25   Dg Abd Portable 1v  03/23/2015   CLINICAL DATA:  Followup small bowel obstruction.  EXAM: PORTABLE ABDOMEN - 1 VIEW  COMPARISON:  03/22/2015  FINDINGS: Dilated loops small bowel are similar to the prior exam allowing for differences in radiographic technique different degrees of magnification.  There is a small amount of air in a nondistended colon.  IMPRESSION: 1. Persistent high-grade partial small bowel obstruction. No significant change from the previous day's study.   Electronically Signed   By: Amie Portland M.D.   On: 03/23/2015 10:01   Dg Abd Portable 1v  03/22/2015   CLINICAL DATA:  81 year old female with recent small bowel obstruction. Initial encounter.  EXAM: PORTABLE ABDOMEN - 1 VIEW  COMPARISON:  03/21/2015 and earlier, including CT Abdomen and Pelvis 03/18/2015.  FINDINGS: Portable AP supine view at 0517 hrs. Enteric tube has been removed. Recurrent dilated gas-filled small bowel loops in the mid abdomen up to 41 mm diameter. Abundant stool now at the splenic flexure. Decreased distal colon gas in the pelvis. Stable cholecystectomy clips. Stable abdominal and pelvic visceral contours. Grossly stable lung bases. Stable visualized osseous structures.  IMPRESSION: NG tube removed with recurrent small bowel obstruction.   Electronically Signed   By: Odessa Fleming M.D.   On: 03/22/2015 07:42   Dg Abd Portable 1v  03/21/2015   CLINICAL DATA:  Small-bowel obstruction .  EXAM: PORTABLE ABDOMEN - 1 VIEW  COMPARISON:  03/20/2015 .  FINDINGS: NG tube noted with tip in the upper portion stomach. Slight advancement should be considered . Surgical clips right upper quadrant Soft tissue structures are unremarkable. Small-bowel distention has improved. No free air. Stool noted throughout the colon. No acute bony abnormality.  IMPRESSION: 1. NG tube noted with tip in the upper portion of the stomach. Advancement suggested.  2. Interim improvement of small bowel distention. Colonic gas pattern is  normal with stool throughout the colon.  These results will be called to the ordering clinician or representative by the Radiologist Assistant, and communication documented in the PACS or zVision Dashboard.   Electronically Signed   By: Maisie Fus  Register   On: 03/21/2015 07:32   Dg Abd Portable 1v  03/20/2015   CLINICAL DATA:  Followup small bowel obstruction.  EXAM: PORTABLE ABDOMEN - 1 VIEW  COMPARISON:  03/19/2015  FINDINGS: Mild small bowel dilation persists most evident in the left upper quadrant. There has been no substantial change from the prior study. Residual contrast is noted in the bladder, also similar to the prior exam. Nasogastric tube tip projects in the distal stomach.  IMPRESSION: Small bowel dilation persists consistent with a persistent partial small bowel obstruction. No change from the previous day's study.   Electronically Signed   By: Amie Portland M.D.   On: 03/20/2015 07:29   Dg Abd Portable 1v  03/19/2015   CLINICAL DATA:  Small bowel obstruction protocol. Small bowel obstruction.  EXAM: PORTABLE ABDOMEN - 1 VIEW  COMPARISON:  03/18/2015.  FINDINGS: This is a 24 hr film. There is no contrast identified in the cecum. On the prior exam at 2046 hr yesterday, oral contrast was present in the stomach. Excreted contrast is present within the urinary bladder. Dilation of small bowel loops measures 36 mm. Cholecystectomy clips are present in the right upper quadrant.  There is less small bowel contrast than expected which may be due to dilution in the proximal small bowel. Nasogastric tube remains in the stomach with the tip at the antrum.  Large amount of stool is present in the colon.  IMPRESSION: Small bowel obstruction with no contrast identified in the cecum at 24 hr.   Electronically Signed   By: Andreas Newport M.D.   On: 03/19/2015 17:11   Dg Abd Portable 1v-small Bowel Obstruction Protocol-initial, 8 Hr Delay  03/19/2015   CLINICAL DATA:  Small bowel obstruction  EXAM: PORTABLE  ABDOMEN - 1 VIEW  COMPARISON:  Radiographs and CT 03/18/2015  FINDINGS: Nasogastric tube extends into the distal stomach. There is contrast in the gastric lumen. There is no significant contrast in the small bowel or colon. Dilated stacked loops of small bowel persist in the mid abdomen, probably unchanged. No free air is evident on this single AP supine portable radiograph.  IMPRESSION: Enteric contrast has not reached the colon. Persistent small bowel dilatation consistent with small-bowel obstruction.   Electronically Signed   By: Ellery Plunk M.D.   On: 03/19/2015 01:48   Dg Abd Portable 1v-small Bowel Protocol-position Verification  03/18/2015   CLINICAL DATA:  Small bowel protocol, nasogastric tube placement  EXAM: PORTABLE ABDOMEN - 1 VIEW  COMPARISON:  Portable exam 1303 hours compared to CT abdomen and pelvis 03/18/2015  FINDINGS: Tip of nasogastric tube projects over distal gastric antrum or duodenal bulb, patient slightly rotated to the RIGHT.  Excreted contrast material within renal collecting systems and distended  urinary bladder.  Air-filled loops of dilated small bowel noted in the mid abdomen suspicious for small bowel obstruction.  Some gas and stool remain present within the colon.  Degenerative disc and facet disease changes thoracolumbar spine.  Osseous demineralization.  IMPRESSION: Tip of nasogastric tube projects over either the distal gastric antrum or the duodenal bulb region.  Dilated small bowel loops question small bowel obstruction.   Electronically Signed   By: Ulyses Southward M.D.   On: 03/18/2015 13:16   Dg Foot 2 Views Right  03/22/2015   CLINICAL DATA:  Dorsal right foot pain for 4 days, no known injury, initial encounter  EXAM: RIGHT FOOT - 2 VIEW  COMPARISON:  None.  FINDINGS: Degenerative changes are noted in the tarsal bones. No acute fracture or dislocation is noted. A small calcaneal spur is noted.  IMPRESSION: Degenerative change without acute abnormality.    Electronically Signed   By: Alcide Clever M.D.   On: 03/22/2015 14:57   Dg Swallowing Func-speech Pathology  04/01/2015    Objective Swallowing Evaluation:    Patient Details  Name: [name] MRN: [id number] Date of Birth: 1933/02/02  Today's Date: 04/01/2015 Time: SLP Start Time (ACUTE ONLY): 0835-SLP Stop Time (ACUTE ONLY): 0857 SLP Time Calculation (min) (ACUTE ONLY): 22 min  Past Medical History:  Past Medical History  Diagnosis Date  . CAD (coronary artery disease)     s/p stenting of LAD 1999- cath 5-08 EF normal LAD 30-40% restenosis. D1  50% D2 80% LCX & RCA minimal plaque  . HTN (hypertension)   . Hyperlipemia   . Anemia     iron deficiency  . Depression   . DVT (deep venous thrombosis)   . Gout   . Osteoporosis   . Pancreatitis   . GERD (gastroesophageal reflux disease)   . Renal insufficiency     Cr 1.2-1.3  . Obesity   . Diabetes mellitus   . Polyarthritis     DJD/ possible PMR  . Vitamin D deficiency   . B12 deficiency   . Tinnitus   . Anxiety   . Aneurysm, thoracic aortic   . Constipation   . Urinary frequency   . Vertigo   . Chronic back pain    Past Surgical History:  Past Surgical History  Procedure Laterality Date  . Hemorrhoid surgery    . Abdominal hysterectomy    . Cholecystectomy    . Tubal ligation    . Coronary angioplasty with stent placement  1999    LAD stent  . Cardiac catheterization  2008    L main 20%, LAD stent patent, D1 50%, D2 80% (small), RCA 20%, EF 55-60%   . Laparoscopy N/A 03/24/2015    Procedure: LAPAROSCOPY DIAGNOSTIC;  Surgeon: Luretha Murphy, MD;   Location: [facility];  Service: General;  Laterality: N/A;  . Laparoscopic lysis of adhesions N/A 03/24/2015    Procedure: LAPAROSCOPIC LYSIS OF ADHESIONS;  Surgeon: Luretha Murphy,  MD;  Location: [facility];  Service: General;  Laterality: N/A;  . Laparotomy N/A 03/24/2015    Procedure: LAPAROTOMY with decompression of bowel;  Surgeon: Luretha Murphy, MD;  Location: [facility];  Service: General;  Laterality: N/A;   HPI:  Other  Pertinent Information: Pt is a 81 year old female admitted 03/18/15  with SBO secondary to adhesions. She failed conservative therapy and  underwent laparoscopic lysis of adhesions/laparotomy with bowel  decompression on 03/24/15. Her hospital course was complicated by sepsis  secondary to  peritoneal contamination with transfer to the SDU on 03/25/15.  She also developed left-sided facial droop/right arm weakness 03/27/15 with  CT scan showing a 4 mm lacunar infarct in the left lentiform nucleus. MRI  failed to show any abnormality. After removal of NG for suction, MD  ordered MBS prior to initiating diet.   No Data Recorded  Assessment / Plan / Recommendation CHL IP CLINICAL IMPRESSIONS 04/01/2015  Therapy Diagnosis Mild pharyngeal phase dysphagia;Mild oral phase  dysphagia  Clinical Impression Mild oral dysphagia characterized by decreased lingual  formation of bolus, improved with straw sips and improving progressively  during exam. oropharyngeal dysphagia also mild with no penetration or  aspiration. Suspect mild standing secretions intially during exam as  pharyngeal residuals decreased and timing of swallow improved with each  trial. Though swallow was initially delayed, by end of exam pt taking  large consecutive sips of thin with minimal delay and minimal residuals.  Recommend pt initiate a dys 3 (mechanical soft) diet with basic aspiration  precautions of upright posture and occasional second swallow to clear oral  cavity and orophrayngeal residuals. SLP will f/u for tolerance during  acute stay.       CHL IP TREATMENT RECOMMENDATION 04/01/2015  Treatment Recommendations Therapy as outlined in treatment plan below     CHL IP DIET RECOMMENDATION 04/01/2015  SLP Diet Recommendations Dysphagia 3 (Mech soft);Thin  Liquid Administration via (None)  Medication Administration Whole meds with liquid  Compensations Multiple dry swallows after each bite/sip  Postural Changes and/or Swallow Maneuvers (None)     CHL IP OTHER  RECOMMENDATIONS 04/01/2015  Recommended Consults (None)  Oral Care Recommendations Oral care BID  Other Recommendations (None)     CHL IP FOLLOW UP RECOMMENDATIONS 03/29/2015  Follow up Recommendations 24 hour supervision/assistance     CHL IP FREQUENCY AND DURATION 04/01/2015  Speech Therapy Frequency (ACUTE ONLY) min 2x/week  Treatment Duration 2 weeks     Pertinent Vitals/Pain NA    SLP Swallow Goals No flowsheet data found.  No flowsheet data found.    CHL IP REASON FOR REFERRAL 04/01/2015  Reason for Referral Objectively evaluate swallowing function     CHL IP ORAL PHASE 04/01/2015  Lips (None)  Tongue (None)  Mucous membranes (None)  Nutritional status (None)  Other (None)  Oxygen therapy (None)  Oral Phase Impaired  Oral - Pudding Teaspoon (None)  Oral - Pudding Cup (None)  Oral - Honey Teaspoon (None)  Oral - Honey Cup (None)  Oral - Honey Syringe (None)  Oral - Nectar Teaspoon (None)  Oral - Nectar Cup (None)  Oral - Nectar Straw (None)  Oral - Nectar Syringe (None)  Oral - Ice Chips (None)  Oral - Thin Teaspoon (None)  Oral - Thin Cup (None)  Oral - Thin Straw (None)  Oral - Thin Syringe (None)  Oral - Puree (None)  Oral - Mechanical Soft (None)  Oral - Regular (None)  Oral - Multi-consistency (None)  Oral - Pill (None)  Oral Phase - Comment (None)      CHL IP PHARYNGEAL PHASE 04/01/2015  Pharyngeal Phase Impaired  Pharyngeal - Pudding Teaspoon (None)  Penetration/Aspiration details (pudding teaspoon) (None)  Pharyngeal - Pudding Cup (None)  Penetration/Aspiration details (pudding cup) (None)  Pharyngeal - Honey Teaspoon (None)  Penetration/Aspiration details (honey teaspoon) (None)  Pharyngeal - Honey Cup (None)  Penetration/Aspiration details (honey cup) (None)  Pharyngeal - Honey Syringe (None)  Penetration/Aspiration details (honey syringe) (None)  Pharyngeal - Nectar Teaspoon (None)  Penetration/Aspiration  details (nectar teaspoon) (None)  Pharyngeal - Nectar Cup (None)  Penetration/Aspiration details  (nectar cup) (None)  Pharyngeal - Nectar Straw (None)  Penetration/Aspiration details (nectar straw) (None)  Pharyngeal - Nectar Syringe (None)  Penetration/Aspiration details (nectar syringe) (None)  Pharyngeal - Ice Chips (None)  Penetration/Aspiration details (ice chips) (None)  Pharyngeal - Thin Teaspoon (None)  Penetration/Aspiration details (thin teaspoon) (None)  Pharyngeal - Thin Cup (None)  Penetration/Aspiration details (thin cup) (None)  Pharyngeal - Thin Straw (None)  Penetration/Aspiration details (thin straw) (None)  Pharyngeal - Thin Syringe (None)  Penetration/Aspiration details (thin syringe') (None)  Pharyngeal - Puree (None)  Penetration/Aspiration details (puree) (None)  Pharyngeal - Mechanical Soft (None)  Penetration/Aspiration details (mechanical soft) (None)  Pharyngeal - Regular (None)  Penetration/Aspiration details (regular) (None)  Pharyngeal - Multi-consistency (None)  Penetration/Aspiration details (multi-consistency) (None)  Pharyngeal - Pill (None)  Penetration/Aspiration details (pill) (None)  Pharyngeal Comment (None)      No flowsheet data found.  No flowsheet data found.        Harlon Ditty, [place] CCC-SLP [phone]  Claudine Mouton 04/01/2015, 10:31 AM      Medical Consultants:    Surgery - Dr. Glenna Fellows   Cardiology   PCCM  Neurology - Dr. Thana Farr   Anti-Infectives:    Mefoxin 03/25/2015 --> 03/26/2015  Rocephin 03/26/2015 -->   Flagyl 03/26/2015 -->  Subjective:   [name] is still needing frequent pain meds, weak with no appetite and significant ongoing abdominal pain.  Bowels moving.  No vomiting.  Objective:    Filed Vitals:   04/02/15 2100 04/02/15 2354 04/03/15 0519 04/03/15 0546  BP: 151/71 155/67 151/71   Pulse: 100 96 92 93  Temp: 99.6 F (37.6 C) 99.5 F (37.5 C) 98.2 F (36.8 C)   TempSrc: Oral Axillary Oral   Resp: 18 18 18    Height:      Weight:      SpO2: 100% 100% 99%     Intake/Output  Summary (Last 24 hours) at 04/03/15 0807 Last data filed at 04/03/15 0700  Gross per 24 hour  Intake   2886 ml  Output   2325 ml  Net    561 ml    Exam: Gen:  NAD Cardiovascular:  RRR, No M/R/G Respiratory:  Lungs CTAB Gastrointestinal:  Abdomen softly distended, tender, + BS Extremities:  1+ edema   Data Reviewed:    Labs: Basic Metabolic Panel:  Recent Labs Lab 03/28/15 0357 03/29/15 0528 03/31/15 0350 04/01/15 0450 04/02/15 0537 04/02/15 1201 04/03/15 0545  NA 134* 133* 136 135 135  --  132*  K 2.8* 3.7 4.8 5.1 5.8* 5.7* 4.9  CL 100* 104 102 104 103  --  100*  CO2 23 23 25 24 25   --  24  GLUCOSE 153* 149* 176* 185* 163*  --  195*  BUN 49* 51* 48* 53* 48*  --  45*  CREATININE 1.28* 1.08* 0.98 0.89 0.80  --  0.75  CALCIUM 8.1* 7.7* 8.2* 8.0* 8.5*  --  8.3*  MG 1.7 1.9 1.7 1.8 1.7  --   --   PHOS 3.1  --  4.5 4.7* 4.6  --   --    GFR Estimated Creatinine Clearance: 53.9 mL/min (by C-G formula based on Cr of 0.75). Liver Function Tests:  Recent Labs Lab 03/28/15 0357 03/29/15 0528 03/31/15 0350  AST 38 33 26  ALT 18 18 14   ALKPHOS 86 98 98  BILITOT 0.2* 0.4 0.4  PROT 5.6* 5.6* 5.9*  ALBUMIN 1.9* 1.7* 1.8*   Coagulation profile  Recent Labs Lab 03/27/15 2348  INR 1.06    CBC:  Recent Labs Lab 03/28/15 0357 04/01/15 0450 04/02/15 0537 04/03/15 0545  WBC 15.4* 14.8* 16.9* 16.9*  NEUTROABS 14.3*  --   --   --   HGB 8.4* 6.4* 9.1* 9.0*  HCT 24.6* 19.5* 28.7* 29.3*  MCV 86.6 89.9 82.0 81.6  PLT 131* 297 364 370   Cardiac Enzymes: No results for input(s): CKTOTAL, CKMB, CKMBINDEX, TROPONINI in the last 168 hours. CBG:  Recent Labs Lab 04/02/15 0555 04/02/15 1152 04/02/15 1636 04/02/15 2356 04/03/15 0524  GLUCAP 151* 211* 135* 203* 194*   Sepsis Labs:  Recent Labs Lab 03/28/15 0357 04/01/15 0450 04/02/15 0537 04/03/15 0545  WBC 15.4* 14.8* 16.9* 16.9*   Microbiology Recent Results (from the past 240 hour(s))  MRSA PCR  Screening     Status: None   Collection Time: 03/24/15  8:52 AM  Result Value Ref Range Status   MRSA by PCR NEGATIVE NEGATIVE Final    Comment:        The GeneXpert MRSA Assay (FDA approved for NASAL specimens only), is one component of a comprehensive MRSA colonization surveillance program. It is not intended to diagnose MRSA infection nor to guide or monitor treatment for MRSA infections.   Culture, blood (x 2)     Status: None   Collection Time: 03/25/15  9:30 AM  Result Value Ref Range Status   Specimen Description BLOOD RIGHT HAND  Final   Special Requests BOTTLES DRAWN AEROBIC ONLY 5CC  Final   Culture   Final    NO GROWTH 5 DAYS Performed at [facility]    Report Status 03/31/2015 FINAL  Final  Culture, blood (x 2)     Status: None   Collection Time: 03/25/15  9:35 AM  Result Value Ref Range Status   Specimen Description BLOOD RIGHT ARM  Final   Special Requests BOTTLES DRAWN AEROBIC AND ANAEROBIC 6CC  Final   Culture   Final    NO GROWTH 5 DAYS Performed at [facility]    Report Status 03/31/2015 FINAL  Final  Culture, Urine     Status: None   Collection Time: 03/25/15 11:57 AM  Result Value Ref Range Status   Specimen Description URINE, CATHETERIZED  Final   Special Requests NONE  Final   Colony Count NO GROWTH Performed at [facility]   Final   Culture NO GROWTH Performed at [facility]   Final   Report Status 03/26/2015 FINAL  Final     Medications:   . antiseptic oral rinse  7 mL Mouth Rinse q12n4p  . aspirin  300 mg Rectal Daily   Or  . aspirin  325 mg Oral Daily  . bisacodyl  10 mg Rectal Daily  . chlorhexidine  15 mL Mouth Rinse BID  . feeding supplement (ENSURE ENLIVE)  237 mL Oral BID BM  . insulin aspart  0-15 Units Subcutaneous 6 times per day  . lip balm  1 application Topical BID  . metoprolol  2.5 mg Intravenous 4 times per day   Continuous Infusions: . sodium chloride 10 mL/hr (03/29/15 0800)    . TPN (CLINIMIX) Adult without lytes 70 mL/hr at 04/02/15 1806   And  . fat emulsion 240 mL (04/02/15 1805)    Time spent: 35 minutes with > 50% of time discussing current diagnostic test results,  clinical impression and plan of care.   LOS: 16 days   RAMA,CHRISTINA  [facility] Pager [phone]. If unable to reach me by pager, please call my cell phone at [phone].  *Please refer to [email or web address], password [email or web address] to get updated schedule on who will round on this patient, as hospitalists switch teams weekly. If 7PM-7AM, please contact night-coverage at [email or web address], password [email or web address] for any overnight needs.  04/03/2015, 8:07 AM

## 2015-04-03 NOTE — Progress Notes (Signed)
PARENTERAL NUTRITION CONSULT NOTE - follow up  Pharmacy Consult for TPN Indication: bowel obstruction  Allergies  Allergen Reactions  . Amlodipine Besylate     REACTION: dizzy  . Aspirin   . Atenolol     REACTION: fatigue  . Benazepril     cough  . Benicar [Olmesartan Medoxomil]     HA  . Cozaar     nausea  . Hydrochlorothiazide W-Triamterene     REACTION: dizzy  . Hydrocodone     REACTION: HA  . Hydroxyzine Pamoate   . Iodine   . Lisinopril     REACTION: tired, cough  . Penicillins Itching    tolerates cephalosporins OK  . Pravastatin     myalgias  . Prednisolone     My stomach hurts  . Tramadol Hcl     REACTION: HA    Patient Measurements: Height: 5\' 4"  (162.6 cm) Weight: 149 lb 4 oz (67.7 kg) IBW/kg (Calculated) : 54.7 Adjusted Body Weight: 57.5kg   Vital Signs: Temp: 98.2 F (36.8 C) (05/22 0519) Temp Source: Oral (05/22 0519) BP: 151/71 mmHg (05/22 0519) Pulse Rate: 93 (05/22 0546) Intake/Output from previous day: 05/21 0701 - 05/22 0700 In: 250 [P.O.:110; I.V.:140] Out: 2325 [Urine:2325] Intake/Output from this shift:    Labs:  Recent Labs  04/01/15 0450 04/02/15 0537 04/03/15 0545  WBC 14.8* 16.9* 16.9*  HGB 6.4* 9.1* 9.0*  HCT 19.5* 28.7* 29.3*  PLT 297 364 370     Recent Labs  04/01/15 0450 04/02/15 0537 04/02/15 1201 04/03/15 0545  NA 135 135  --  132*  K 5.1 5.8* 5.7* 4.9  CL 104 103  --  100*  CO2 24 25  --  24  GLUCOSE 185* 163*  --  195*  BUN 53* 48*  --  45*  CREATININE 0.89 0.80  --  0.75  CALCIUM 8.0* 8.5*  --  8.3*  MG 1.8 1.7  --   --   PHOS 4.7* 4.6  --   --    Estimated Creatinine Clearance: 53.9 mL/min (by C-G formula based on Cr of 0.75).    Recent Labs  04/02/15 1636 04/02/15 2356 04/03/15 0524  GLUCAP 135* 203* 194*    Insulin Requirements: 13 units Novolog over 24 hours  Current Nutrition:  Dysphagia 3 diet (start 5/20) Ensure BID ordered on 5/20 (only received one dose)  IVF: NS @ 10  ml/hr  Central access: PICC 5/11 TPN start date: 5/11  ASSESSMENT                                                                                                          HPI: 81 year old female with history of hypertension, coronary artery disease, hyperlipidemia, iron deficiency anemia, depression, osteoporosis, mild chronic kidney disease presented to the ED with ongoing nausea for 4 days and diffuse crampy lower abdominal pain since one day prior to admission. Associated poor by mouth intake and generalized weakness. Patient was found to have small bowel obstruction.  Pharmacy consulted to start TPN.  Significant events:  5/12:  laparoscopic lysis of adhesions with laparotomy and decompression of bowel 5/13: new AKI 5/15: Continue NG tube and NPO d/t bilious output pending BM.   5/19: Remove NGT 5/20 Completed swallow eval - starting dysphagia diet 5/21: TPN changed to electrolytes free d/t high K+.  Ate about 10% of lunch  Today:   Glucose (goal <150) - CBG range 135-211 (two CBGs >200)  Electrolytes - K improved with 4.9 (s/p kayexalate and lytes removed from TPN bag), Mag 1.7 on 5/21 with repletion ordered, Phos  4.6.  Others WNL including CoCa   Renal -  SCr now wnl  WBC up 16.9, hgb stable at 9  LFTs - WNL (5/19)  TGs - wnl (5/16)  Prealbumin: 2.7 (5/16)  NUTRITIONAL GOALS                                                                                             RD recs (5/18): 75-85 g/day protein, 1500-1700 Kcal/day. Clinimix E 5/15 at a goal rate of 35ml/hr + 20% fat emulsion at 41ml/hr to provide: 84 g/day protein, 1672 Kcal/day.  PLAN                                                                                                                         1) ) At 1800 today:  Continue  Clinimix 5/15 (electrolyte free TPN formulation) at goal rate 70 ml/hr.  Continue 20% fat emulsion at 80ml/hr.  Will plan on changing back to clinimix E 5/15 tomorrow if K+ remains  stable   TPN to contain standard multivitamins and trace elements.  Maintain IVF at [facility]  change mod SSI q4h  TPN lab panels on Mondays & Thursdays.  F/u weaning TPN when pt is tolerating PO intake.   Dorna Leitz, PharmD, BCPS 04/03/2015 7:25 AM

## 2015-04-03 NOTE — Consult Note (Signed)
Chief Complaint: Chief Complaint  Patient presents with  . Nausea  SBO surgery 03/24/2015  Referring Physician(s): [facility]/TR[facility]  History of Present Illness: [name] is a 81 y.o. female   Pt with previous abd surgeries "few yrs ago" Developed severe abd pain; N/V Admitted with small bowel obstruction secondary adhesions Surgery 03/24/2015 Also suffered CVA 03/26/2015 Now with low grade fever and increasing wbc CT 5/20 reveals abd abscesses Request for abd abscess drain placement per Dr Rama/Dr Johna Sheriff Dr Lowella Dandy has reviewed imaging and appoves procedure I have seen and examined pt   Past Medical History  Diagnosis Date  . CAD (coronary artery disease)     s/p stenting of LAD 1999- cath 5-08 EF normal LAD 30-40% restenosis. D1 50% D2 80% LCX & RCA minimal plaque  . HTN (hypertension)   . Hyperlipemia   . Anemia     iron deficiency  . Depression   . DVT (deep venous thrombosis)   . Gout   . Osteoporosis   . Pancreatitis   . GERD (gastroesophageal reflux disease)   . Renal insufficiency     Cr 1.2-1.3  . Obesity   . Diabetes mellitus   . Polyarthritis     DJD/ possible PMR  . Vitamin D deficiency   . B12 deficiency   . Tinnitus   . Anxiety   . Aneurysm, thoracic aortic   . Constipation   . Urinary frequency   . Vertigo   . Chronic back pain     Past Surgical History  Procedure Laterality Date  . Hemorrhoid surgery    . Abdominal hysterectomy    . Cholecystectomy    . Tubal ligation    . Coronary angioplasty with stent placement  1999    LAD stent  . Cardiac catheterization  2008    L main 20%, LAD stent patent, D1 50%, D2 80% (small), RCA 20%, EF 55-60%  . Laparoscopy N/A 03/24/2015    Procedure: LAPAROSCOPY DIAGNOSTIC;  Surgeon: Luretha Murphy, MD;  Location: [facility];  Service: General;  Laterality: N/A;  . Laparoscopic lysis of adhesions N/A 03/24/2015    Procedure: LAPAROSCOPIC LYSIS OF ADHESIONS;  Surgeon: Luretha Murphy, MD;  Location: [facility];   Service: General;  Laterality: N/A;  . Laparotomy N/A 03/24/2015    Procedure: LAPAROTOMY with decompression of bowel;  Surgeon: Luretha Murphy, MD;  Location: [facility];  Service: General;  Laterality: N/A;    Allergies: Amlodipine besylate; Aspirin; Atenolol; Benazepril; Benicar; Cozaar; Hydrochlorothiazide w-triamterene; Hydrocodone; Hydroxyzine pamoate; Iodine; Lisinopril; Penicillins; Pravastatin; Prednisolone; and Tramadol hcl  Medications: Prior to Admission medications   Medication Sig Start Date End Date Taking? Authorizing Provider  acetaminophen (TYLENOL) 650 MG CR tablet Take 1,300 mg by mouth every 8 (eight) hours as needed for pain.    Yes Historical Provider, MD  aspirin 325 MG tablet Take 325 mg by mouth daily.   Yes Historical Provider, MD  cholecalciferol (VITAMIN D) 1000 UNITS tablet Take 2 tablets (2,000 Units total) by mouth daily. 04/17/13  Yes Aleksei Plotnikov V, MD  Cyanocobalamin (VITAMIN B-12) 1000 MCG SUBL Place 1 tablet (1,000 mcg total) under the tongue daily. 12/03/13  Yes Aleksei Plotnikov V, MD  furosemide (LASIX) 20 MG tablet Take 1 tablet (20 mg total) by mouth daily as needed for edema. Patient taking differently: Take 20 mg by mouth daily.  03/10/15 03/09/16 Yes Aleksei Plotnikov V, MD  metFORMIN (GLUCOPHAGE) 500 MG tablet TAKE 1 TABLET TWICE DAILY  WITH  A  MEAL 08/23/14  Yes Aleksei Plotnikov V, MD  OVER THE COUNTER MEDICATION Apply 1 application topically 2 (two) times daily as needed (dryness). Diabetic Lotion.   Yes Historical Provider, MD  oxybutynin (DITROPAN) 5 MG tablet Take 1 tablet (5 mg total) by mouth 2 (two) times daily. 11/25/14  Yes Aleksei Plotnikov V, MD  Polyethyl Glycol-Propyl Glycol (SYSTANE OP) Apply 1-2 drops to eye daily as needed (dry eyes).   Yes Historical Provider, MD  LORazepam (ATIVAN) 1 MG tablet Take 0.5 tablets (0.5 mg total) by mouth every 8 (eight) hours as needed for anxiety. Patient not taking: Reported on 03/18/2015 05/07/13   Bethann Berkshire, MD     Family History  Problem Relation Age of Onset  . Adopted: Yes  . Diabetes Mother   . Hypertension Father     History   Social History  . Marital Status: Married    Spouse Name: N/A  . Number of Children: N/A  . Years of Education: N/A   Occupational History  . Retired    Social History Main Topics  . Smoking status: Never Smoker   . Smokeless tobacco: Not on file  . Alcohol Use: No  . Drug Use: No  . Sexual Activity: Not Currently   Other Topics Concern  . None   Social History Narrative   She lives in [place] with her husband   She is retired   No tobacco or alcohol use.    Review of Systems: A 12 point ROS discussed and pertinent positives are indicated in the HPI above.  All other systems are negative.  Review of Systems  Constitutional: Positive for fever, activity change and appetite change.  Respiratory: Positive for shortness of breath.   Gastrointestinal: Positive for nausea and abdominal pain.  Neurological: Positive for weakness.  Psychiatric/Behavioral: Negative for behavioral problems.    Vital Signs: BP 151/71 mmHg  Pulse 93  Temp(Src) 98.2 F (36.8 C) (Oral)  Resp 18  Ht 5\' 4"  (1.626 m)  Wt 67.7 kg (149 lb 4 oz)  BMI 25.61 kg/m2  SpO2 99%  Physical Exam  Cardiovascular: Normal rate.   No murmur heard. Pulmonary/Chest: Effort normal. She has wheezes.  Abdominal: Soft. Bowel sounds are normal. There is tenderness.  Musculoskeletal: Normal range of motion.  Skin: Skin is warm and dry.  Psychiatric:  Pt sleepy Answers with nods dtr at bedside Consented for procedure  Nursing note and vitals reviewed.   Mallampati Score:  MD Evaluation Airway: WNL Heart: WNL Abdomen: WNL Chest/ Lungs: WNL ASA  Classification: 3 Mallampati/Airway Score: Two  Imaging: Ct Head Wo Contrast  03/27/2015   CLINICAL DATA:  RIGHT-sided facial droop. Symptoms began earlier today. Altered mental status.  EXAM: CT HEAD WITHOUT  CONTRAST  TECHNIQUE: Contiguous axial images were obtained from the base of the skull through the vertex without intravenous contrast.  COMPARISON:  05/27/2014.  FINDINGS: Possible acute 4 mm lacune, LEFT lentiform nucleus as seen on image 12 series 5. This was not clearly present on in 2015 scan.  No other areas of concern for cortical or subcortical infarction.  No hemorrhage, mass lesion, hydrocephalus, or extra-axial fluid.  Cerebral and cerebellar atrophy. Generalized white matter hypoattenuation, likely small vessel disease. Than the calvarium intact. Vascular calcification. No sinus or mastoid disease. Dense lenticular opacities.  IMPRESSION: Possible acute 4 mm lacunar infarct LEFT lentiform nucleus. If further investigation desired, and no contraindications, consider MRI brain.   Electronically Signed   By: 2016.D.  On: 03/27/2015 16:25   Mr Maxine Glenn Head Wo Contrast  03/28/2015   CLINICAL DATA:  81 year old with right-sided facial droop. Altered mental status. Subsequent encounter.  EXAM: MRI HEAD WITHOUT CONTRAST  MRA HEAD WITHOUT CONTRAST  TECHNIQUE: Multiplanar, multiecho pulse sequences of the brain and surrounding structures were obtained without intravenous contrast. Angiographic images of the head were obtained using MRA technique without contrast.  COMPARISON:  03/27/2015 head CT.  FINDINGS: MRI HEAD FINDINGS  No acute infarct.  No intracranial hemorrhage.  Remote tiny infarct right cerebellum. Minimal small vessel disease type changes.  Incidentally noted is a prominent peri vascular space left lenticular nucleus.  Global atrophy without hydrocephalus.  No intracranial mass lesion noted on this unenhanced exam.  Cervical medullary junction unremarkable. Mild spinal stenosis C3-4.  Expanded partially empty sella without other findings of pseudotumor cerebri  Pineal region unremarkable.  No intracranial mass lesion noted on this unenhanced exam.  MRA HEAD FINDINGS  Mild narrowing pre  cavernous/ cavernous segment right internal carotid artery.  Mild ectasia left internal carotid artery cavernous segment. The minimal bulge along the left internal carotid artery cavernous segment most likely related to slight ectasia of the left ophthalmic artery origin rather than small aneurysm. Mild motion degradation limits evaluation.  Slightly ectatic anterior communicating artery without aneurysm.  Middle cerebral artery mild branch vessel irregularity bilaterally.  No significant stenosis distal vertebral arteries or basilar artery. Regions of mild irregularity and slight narrowing.  IMPRESSION: MRI HEAD  No acute infarct.  Remote tiny infarct right cerebellum.  Incidentally noted is a prominent peri vascular space left lenticular nucleus.  Global atrophy without hydrocephalus.  Expanded partially empty sella without other findings of pseudotumor cerebri  MRA HEAD FINDINGS  No medium or large size vessel significant stenosis or occlusion. Evaluation of branch vessels slightly limited by motion degradation.   Electronically Signed   By: Lacy Duverney M.D.   On: 03/28/2015 12:02   Mr Brain Wo Contrast  03/28/2015   CLINICAL DATA:  81 year old with right-sided facial droop. Altered mental status. Subsequent encounter.  EXAM: MRI HEAD WITHOUT CONTRAST  MRA HEAD WITHOUT CONTRAST  TECHNIQUE: Multiplanar, multiecho pulse sequences of the brain and surrounding structures were obtained without intravenous contrast. Angiographic images of the head were obtained using MRA technique without contrast.  COMPARISON:  03/27/2015 head CT.  FINDINGS: MRI HEAD FINDINGS  No acute infarct.  No intracranial hemorrhage.  Remote tiny infarct right cerebellum. Minimal small vessel disease type changes.  Incidentally noted is a prominent peri vascular space left lenticular nucleus.  Global atrophy without hydrocephalus.  No intracranial mass lesion noted on this unenhanced exam.  Cervical medullary junction unremarkable. Mild  spinal stenosis C3-4.  Expanded partially empty sella without other findings of pseudotumor cerebri  Pineal region unremarkable.  No intracranial mass lesion noted on this unenhanced exam.  MRA HEAD FINDINGS  Mild narrowing pre cavernous/ cavernous segment right internal carotid artery.  Mild ectasia left internal carotid artery cavernous segment. The minimal bulge along the left internal carotid artery cavernous segment most likely related to slight ectasia of the left ophthalmic artery origin rather than small aneurysm. Mild motion degradation limits evaluation.  Slightly ectatic anterior communicating artery without aneurysm.  Middle cerebral artery mild branch vessel irregularity bilaterally.  No significant stenosis distal vertebral arteries or basilar artery. Regions of mild irregularity and slight narrowing.  IMPRESSION: MRI HEAD  No acute infarct.  Remote tiny infarct right cerebellum.  Incidentally noted is a prominent peri vascular  space left lenticular nucleus.  Global atrophy without hydrocephalus.  Expanded partially empty sella without other findings of pseudotumor cerebri  MRA HEAD FINDINGS  No medium or large size vessel significant stenosis or occlusion. Evaluation of branch vessels slightly limited by motion degradation.   Electronically Signed   By: Lacy Duverney M.D.   On: 03/28/2015 12:02   Ct Abdomen Pelvis W Contrast  04/02/2015   CLINICAL DATA:  Nonspecific abdominal pain. Status post recent laparotomy for small bowel obstruction.  EXAM: CT ABDOMEN AND PELVIS WITH CONTRAST  TECHNIQUE: Multidetector CT imaging of the abdomen and pelvis was performed using the standard protocol following bolus administration of intravenous contrast.  CONTRAST:  63mL OMNIPAQUE IOHEXOL 300 MG/ML SOLN, OMNIPAQUE IOHEXOL 300 MG/ML SOLN  COMPARISON:  Renal ultrasound dated 03/25/2015 and abdomen and pelvis CT dated 03/18/2015.  FINDINGS: Cholecystectomy clips. Interval midline surgical scar in the skin  clips at the level of the pelvis. Loculated fluid with peripheral rim enhancement beneath the left hemidiaphragm and partially surrounding the spleen. This measures 7.7 x 5.3 cm on axial image 16. This measures 7.7 cm in length on coronal image number 92.  There is an additional fluid collection in the lateral aspect of the left mid abdomen, measuring 8.3 x 4.2 cm on axial image number 31. This also has peripheral rim enhancement. This measures 9.8 cm in length on coronal image number 64.  There are dilated small bowel loops in the lower abdomen and upper pelvis. Above the urinary bladder, one of these loops contains fluid, small amount of oral contrast and some gas. No definite separate fluid collection is seen.  A Foley catheter is in the urinary bladder with associated air in the bladder. No free peritoneal air seen. Atheromatous arterial calcifications, including the coronary arteries. Cholecystectomy clips.  Small left pleural effusion. Bilateral lower lobe atelectasis. Multiple colonic diverticula. Small left inguinal hernia containing fat. Diffuse subcutaneous edema. Lumbar and lower thoracic spine degenerative changes and scoliosis.  IMPRESSION: 1. 7.7 x 7.7 x 5.3 cm fluid collection with rim enhancement beneath the left hemidiaphragm and partially surrounding the spleen. This is suspicious for an abscess. 2. 9.8 x 8.3 x 4.2 cm arm left mid abdominal fluid collection with peripheral rim enhancement, suspicious for an abscess. 3. Focal ileus or partial obstruction involving small bowel in the inferior abdomen and upper pelvis. 4. Small left pleural effusion. 5. Bilateral lower lobe atelectasis. 6. Colonic diverticulosis.   Electronically Signed   By: Beckie Salts M.D.   On: 04/02/2015 17:38   Ct Abdomen Pelvis W Contrast  03/18/2015   CLINICAL DATA:  Nausea and vomiting for 4 days, anxiety, status post hysterectomy CT pelvis 05/27/2014  EXAM: CT ABDOMEN AND PELVIS WITH CONTRAST  TECHNIQUE: Multidetector CT  imaging of the abdomen and pelvis was performed using the standard protocol following bolus administration of intravenous contrast.  CONTRAST:  67mL OMNIPAQUE IOHEXOL 300 MG/ML SOLN, OMNIPAQUE IOHEXOL 300 MG/ML SOLN  COMPARISON:  None.  FINDINGS: Sagittal images of the spine shows diffuse osteopenia. Degenerative changes thoracolumbar spine. The lung bases are unremarkable.  The patient is status post cholecystectomy. Mild intrahepatic biliary ductal dilatation. There is CBD dilatation up to 1.1 cm. No focal hepatic mass. The pancreas, spleen and adrenal glands are unremarkable. Kidneys are symmetrical in size and enhancement. No hydronephrosis or hydroureter. Delayed renal images shows bilateral renal symmetrical excretion.  Atherosclerotic calcifications of abdominal aorta and iliac arteries. Moderate stool noted in right colon and proximal transverse  colon. No colonic obstruction. The descending colon and sigmoid colon are empty collapsed. Some stool noted within rectum.  There are fluid distended small bowel loops in mid upper abdomen and left lower abdomen and pelvis. Small amount of fluid/stranding noted a adjacent to small bowel loops in left lower quadrant see axial image 57. Distal small bowel is decreased caliber collapsed. Findings are consistent with small bowel obstruction.  Mild distended urinary bladder. The patient is status post hysterectomy. The terminal ileum is small caliber decompressed. Contrast material is noted within stomach. No any contrast material is noted within small bowel or colon.  IMPRESSION: 1. There are fluid distended small bowel loops with multiple air-fluid levels highly suspicious for small bowel obstruction. Distal small bowel is small caliber decompressed. Terminal ileum is small caliber. Small amount of fluid/stranding noted adjacent to small bowel loops in left lower quadrant please see images 49 and 57. 2. Moderate stool noted in right colon and proximal transverse  colon. Descending colon and sigmoid colon are empty collapsed. 3. No pericecal inflammation. 4. No hydronephrosis or hydroureter. 5. Status post cholecystectomy. Mild intrahepatic biliary ductal dilatation. CBD dilatation up to 1.1 cm. 6. No hydronephrosis or hydroureter. 7. Status post hysterectomy. These results were called by telephone at the time of interpretation on 03/18/2015 at 8:59 am to Dr. Gerhard Munch , who verbally acknowledged these results.   Electronically Signed   By: Natasha Mead M.D.   On: 03/18/2015 08:59   US Renal  03/25/2015   CLINICAL DATA:  Acute renal failure.  EXAM: RENAL / URINARY TRACT ULTRASOUND COMPLETE  COMPARISON:  None.  FINDINGS: Right Kidney:  Length: 8.1 cm. Increased echogenicity consistent with chronic medical renal disease. No mass or hydronephrosis visualized.  Left Kidney:  Length: 9.3 cm. Increased echogenicity consistent chronic medical renal disease. No mass or hydronephrosis visualized.  Bladder:  Bladder decompressed by Foley catheter.  IMPRESSION: Bilateral echodense kidneys consistent chronic medical renal disease. No acute abnormality. No hydronephrosis or bladder distention.   Electronically Signed   By: Maisie Fus  Register   On: 03/25/2015 13:27   Dg Chest Port 1 View  03/27/2015   CLINICAL DATA:  Left PICC placement. Nausea and vomiting. Initial encounter.  EXAM: PORTABLE CHEST - 1 VIEW  COMPARISON:  Chest radiograph performed 03/26/2015  FINDINGS: The patient's left PICC is noted ending about the proximal SVC. An enteric tube is noted extending below the diaphragm.  The lungs are hypoexpanded. Bibasilar and right upper lung zone airspace opacities may reflect atelectasis or pneumonia. No pleural effusion or pneumothorax is seen  The cardiomediastinal silhouette is borderline normal in size. No acute osseous abnormalities are identified.  IMPRESSION: 1. Left PICC noted ending about the proximal SVC. 2. Lungs hypoexpanded. Bibasilar and right upper lung zone  airspace opacities may reflect atelectasis or pneumonia.  These results were called by telephone at the time of interpretation on 03/27/2015 at 7:33 pm to Nursing in the [facility], who verbally acknowledged these results.   Electronically Signed   By: Roanna Raider M.D.   On: 03/27/2015 19:34   Dg Chest Port 1 View  03/26/2015   CLINICAL DATA:  Atelectasis  EXAM: PORTABLE CHEST - 1 VIEW  COMPARISON:  Yesterday  FINDINGS: NG tube and left PICC are stable. Upper normal heart size. Low volumes. Bibasilar atelectasis is not significantly changed. No pneumothorax.  IMPRESSION: Stable bibasilar atelectasis.   Electronically Signed   By: Jolaine Click M.D.   On: 03/26/2015 08:14  Dg Chest Port 1 View  03/25/2015   CLINICAL DATA:  Tachycardia and hypotension; concern for sepsis  EXAM: PORTABLE CHEST - 1 VIEW  COMPARISON:  May 27, 2014  FINDINGS: There is patchy atelectasis in both lung bases. The degree of inspiration is shallow. There is no frank airspace consolidation. Heart is upper normal in size with pulmonary vascularity within normal limits.  Central catheter tip is in the superior vena cava. Nasogastric tube tip and side port are in the distal stomach. No pneumothorax.  IMPRESSION: Bibasilar atelectasis. No pneumothorax. Degree of inspiration shallow.   Electronically Signed   By: Bretta Bang III M.D.   On: 03/25/2015 09:03   Dg Abd Portable 1v  03/24/2015   CLINICAL DATA:  Small-bowel obstruction  EXAM: PORTABLE ABDOMEN - 1 VIEW  COMPARISON:  03/23/2015  FINDINGS: NG tube is been placed with the tip in the gastric antrum  Small bowel dilatation shows mild interval improvement. Colon is decompressed with moderate stool in the right colon. Surgical clips in the gallbladder fossa.  IMPRESSION: Mild improvement in small bowel obstruction. NG tube in the gastric antrum.   Electronically Signed   By: Marlan Palau M.D.   On: 03/24/2015 08:25   Dg Abd Portable 1v  03/23/2015   CLINICAL  DATA:  Followup small bowel obstruction.  EXAM: PORTABLE ABDOMEN - 1 VIEW  COMPARISON:  03/22/2015  FINDINGS: Dilated loops small bowel are similar to the prior exam allowing for differences in radiographic technique different degrees of magnification.  There is a small amount of air in a nondistended colon.  IMPRESSION: 1. Persistent high-grade partial small bowel obstruction. No significant change from the previous day's study.   Electronically Signed   By: Amie Portland M.D.   On: 03/23/2015 10:01   Dg Abd Portable 1v  03/22/2015   CLINICAL DATA:  81 year old female with recent small bowel obstruction. Initial encounter.  EXAM: PORTABLE ABDOMEN - 1 VIEW  COMPARISON:  03/21/2015 and earlier, including CT Abdomen and Pelvis 03/18/2015.  FINDINGS: Portable AP supine view at 0517 hrs. Enteric tube has been removed. Recurrent dilated gas-filled small bowel loops in the mid abdomen up to 41 mm diameter. Abundant stool now at the splenic flexure. Decreased distal colon gas in the pelvis. Stable cholecystectomy clips. Stable abdominal and pelvic visceral contours. Grossly stable lung bases. Stable visualized osseous structures.  IMPRESSION: NG tube removed with recurrent small bowel obstruction.   Electronically Signed   By: Odessa Fleming M.D.   On: 03/22/2015 07:42   Dg Abd Portable 1v  03/21/2015   CLINICAL DATA:  Small-bowel obstruction .  EXAM: PORTABLE ABDOMEN - 1 VIEW  COMPARISON:  03/20/2015 .  FINDINGS: NG tube noted with tip in the upper portion stomach. Slight advancement should be considered . Surgical clips right upper quadrant Soft tissue structures are unremarkable. Small-bowel distention has improved. No free air. Stool noted throughout the colon. No acute bony abnormality.  IMPRESSION: 1. NG tube noted with tip in the upper portion of the stomach. Advancement suggested.  2. Interim improvement of small bowel distention. Colonic gas pattern is normal with stool throughout the colon.  These results will be  called to the ordering clinician or representative by the Radiologist Assistant, and communication documented in the PACS or zVision Dashboard.   Electronically Signed   By: Maisie Fus  Register   On: 03/21/2015 07:32   Dg Abd Portable 1v  03/20/2015   CLINICAL DATA:  Followup small bowel obstruction.  EXAM: PORTABLE  ABDOMEN - 1 VIEW  COMPARISON:  03/19/2015  FINDINGS: Mild small bowel dilation persists most evident in the left upper quadrant. There has been no substantial change from the prior study. Residual contrast is noted in the bladder, also similar to the prior exam. Nasogastric tube tip projects in the distal stomach.  IMPRESSION: Small bowel dilation persists consistent with a persistent partial small bowel obstruction. No change from the previous day's study.   Electronically Signed   By: Amie Portland M.D.   On: 03/20/2015 07:29   Dg Abd Portable 1v  03/19/2015   CLINICAL DATA:  Small bowel obstruction protocol. Small bowel obstruction.  EXAM: PORTABLE ABDOMEN - 1 VIEW  COMPARISON:  03/18/2015.  FINDINGS: This is a 24 hr film. There is no contrast identified in the cecum. On the prior exam at 2046 hr yesterday, oral contrast was present in the stomach. Excreted contrast is present within the urinary bladder. Dilation of small bowel loops measures 36 mm. Cholecystectomy clips are present in the right upper quadrant.  There is less small bowel contrast than expected which may be due to dilution in the proximal small bowel. Nasogastric tube remains in the stomach with the tip at the antrum.  Large amount of stool is present in the colon.  IMPRESSION: Small bowel obstruction with no contrast identified in the cecum at 24 hr.   Electronically Signed   By: Andreas Newport M.D.   On: 03/19/2015 17:11   Dg Abd Portable 1v-small Bowel Obstruction Protocol-initial, 8 Hr Delay  03/19/2015   CLINICAL DATA:  Small bowel obstruction  EXAM: PORTABLE ABDOMEN - 1 VIEW  COMPARISON:  Radiographs and CT 03/18/2015   FINDINGS: Nasogastric tube extends into the distal stomach. There is contrast in the gastric lumen. There is no significant contrast in the small bowel or colon. Dilated stacked loops of small bowel persist in the mid abdomen, probably unchanged. No free air is evident on this single AP supine portable radiograph.  IMPRESSION: Enteric contrast has not reached the colon. Persistent small bowel dilatation consistent with small-bowel obstruction.   Electronically Signed   By: Ellery Plunk M.D.   On: 03/19/2015 01:48   Dg Abd Portable 1v-small Bowel Protocol-position Verification  03/18/2015   CLINICAL DATA:  Small bowel protocol, nasogastric tube placement  EXAM: PORTABLE ABDOMEN - 1 VIEW  COMPARISON:  Portable exam 1303 hours compared to CT abdomen and pelvis 03/18/2015  FINDINGS: Tip of nasogastric tube projects over distal gastric antrum or duodenal bulb, patient slightly rotated to the RIGHT.  Excreted contrast material within renal collecting systems and distended urinary bladder.  Air-filled loops of dilated small bowel noted in the mid abdomen suspicious for small bowel obstruction.  Some gas and stool remain present within the colon.  Degenerative disc and facet disease changes thoracolumbar spine.  Osseous demineralization.  IMPRESSION: Tip of nasogastric tube projects over either the distal gastric antrum or the duodenal bulb region.  Dilated small bowel loops question small bowel obstruction.   Electronically Signed   By: Ulyses Southward M.D.   On: 03/18/2015 13:16   Dg Foot 2 Views Right  03/22/2015   CLINICAL DATA:  Dorsal right foot pain for 4 days, no known injury, initial encounter  EXAM: RIGHT FOOT - 2 VIEW  COMPARISON:  None.  FINDINGS: Degenerative changes are noted in the tarsal bones. No acute fracture or dislocation is noted. A small calcaneal spur is noted.  IMPRESSION: Degenerative change without acute abnormality.   Electronically Signed  By: Alcide Clever M.D.   On: 03/22/2015 14:57    Dg Swallowing Func-speech Pathology  04/01/2015    Objective Swallowing Evaluation:    Patient Details  Name: [name] MRN: [id number] Date of Birth: Oct 08, 1933  Today's Date: 04/01/2015 Time: SLP Start Time (ACUTE ONLY): 0835-SLP Stop Time (ACUTE ONLY): 0857 SLP Time Calculation (min) (ACUTE ONLY): 22 min  Past Medical History:  Past Medical History  Diagnosis Date  . CAD (coronary artery disease)     s/p stenting of LAD 1999- cath 5-08 EF normal LAD 30-40% restenosis. D1  50% D2 80% LCX & RCA minimal plaque  . HTN (hypertension)   . Hyperlipemia   . Anemia     iron deficiency  . Depression   . DVT (deep venous thrombosis)   . Gout   . Osteoporosis   . Pancreatitis   . GERD (gastroesophageal reflux disease)   . Renal insufficiency     Cr 1.2-1.3  . Obesity   . Diabetes mellitus   . Polyarthritis     DJD/ possible PMR  . Vitamin D deficiency   . B12 deficiency   . Tinnitus   . Anxiety   . Aneurysm, thoracic aortic   . Constipation   . Urinary frequency   . Vertigo   . Chronic back pain    Past Surgical History:  Past Surgical History  Procedure Laterality Date  . Hemorrhoid surgery    . Abdominal hysterectomy    . Cholecystectomy    . Tubal ligation    . Coronary angioplasty with stent placement  1999    LAD stent  . Cardiac catheterization  2008    L main 20%, LAD stent patent, D1 50%, D2 80% (small), RCA 20%, EF 55-60%   . Laparoscopy N/A 03/24/2015    Procedure: LAPAROSCOPY DIAGNOSTIC;  Surgeon: Luretha Murphy, MD;   Location: [facility];  Service: General;  Laterality: N/A;  . Laparoscopic lysis of adhesions N/A 03/24/2015    Procedure: LAPAROSCOPIC LYSIS OF ADHESIONS;  Surgeon: Luretha Murphy,  MD;  Location: [facility];  Service: General;  Laterality: N/A;  . Laparotomy N/A 03/24/2015    Procedure: LAPAROTOMY with decompression of bowel;  Surgeon: Luretha Murphy, MD;  Location: [facility];  Service: General;  Laterality: N/A;   HPI:  Other Pertinent Information: Pt is a 81 year old female admitted 03/18/15  with SBO  secondary to adhesions. She failed conservative therapy and  underwent laparoscopic lysis of adhesions/laparotomy with bowel  decompression on 03/24/15. Her hospital course was complicated by sepsis  secondary to peritoneal contamination with transfer to the SDU on 03/25/15.  She also developed left-sided facial droop/right arm weakness 03/27/15 with  CT scan showing a 4 mm lacunar infarct in the left lentiform nucleus. MRI  failed to show any abnormality. After removal of NG for suction, MD  ordered MBS prior to initiating diet.   No Data Recorded  Assessment / Plan / Recommendation CHL IP CLINICAL IMPRESSIONS 04/01/2015  Therapy Diagnosis Mild pharyngeal phase dysphagia;Mild oral phase  dysphagia  Clinical Impression Mild oral dysphagia characterized by decreased lingual  formation of bolus, improved with straw sips and improving progressively  during exam. oropharyngeal dysphagia also mild with no penetration or  aspiration. Suspect mild standing secretions intially during exam as  pharyngeal residuals decreased and timing of swallow improved with each  trial. Though swallow was initially delayed, by end of exam pt taking  large consecutive sips of thin with minimal delay  and minimal residuals.  Recommend pt initiate a dys 3 (mechanical soft) diet with basic aspiration  precautions of upright posture and occasional second swallow to clear oral  cavity and orophrayngeal residuals. SLP will f/u for tolerance during  acute stay.       CHL IP TREATMENT RECOMMENDATION 04/01/2015  Treatment Recommendations Therapy as outlined in treatment plan below     CHL IP DIET RECOMMENDATION 04/01/2015  SLP Diet Recommendations Dysphagia 3 (Mech soft);Thin  Liquid Administration via (None)  Medication Administration Whole meds with liquid  Compensations Multiple dry swallows after each bite/sip  Postural Changes and/or Swallow Maneuvers (None)     CHL IP OTHER RECOMMENDATIONS 04/01/2015  Recommended Consults (None)  Oral Care  Recommendations Oral care BID  Other Recommendations (None)     CHL IP FOLLOW UP RECOMMENDATIONS 03/29/2015  Follow up Recommendations 24 hour supervision/assistance     CHL IP FREQUENCY AND DURATION 04/01/2015  Speech Therapy Frequency (ACUTE ONLY) min 2x/week  Treatment Duration 2 weeks     Pertinent Vitals/Pain NA    SLP Swallow Goals No flowsheet data found.  No flowsheet data found.    CHL IP REASON FOR REFERRAL 04/01/2015  Reason for Referral Objectively evaluate swallowing function     CHL IP ORAL PHASE 04/01/2015  Lips (None)  Tongue (None)  Mucous membranes (None)  Nutritional status (None)  Other (None)  Oxygen therapy (None)  Oral Phase Impaired  Oral - Pudding Teaspoon (None)  Oral - Pudding Cup (None)  Oral - Honey Teaspoon (None)  Oral - Honey Cup (None)  Oral - Honey Syringe (None)  Oral - Nectar Teaspoon (None)  Oral - Nectar Cup (None)  Oral - Nectar Straw (None)  Oral - Nectar Syringe (None)  Oral - Ice Chips (None)  Oral - Thin Teaspoon (None)  Oral - Thin Cup (None)  Oral - Thin Straw (None)  Oral - Thin Syringe (None)  Oral - Puree (None)  Oral - Mechanical Soft (None)  Oral - Regular (None)  Oral - Multi-consistency (None)  Oral - Pill (None)  Oral Phase - Comment (None)      CHL IP PHARYNGEAL PHASE 04/01/2015  Pharyngeal Phase Impaired  Pharyngeal - Pudding Teaspoon (None)  Penetration/Aspiration details (pudding teaspoon) (None)  Pharyngeal - Pudding Cup (None)  Penetration/Aspiration details (pudding cup) (None)  Pharyngeal - Honey Teaspoon (None)  Penetration/Aspiration details (honey teaspoon) (None)  Pharyngeal - Honey Cup (None)  Penetration/Aspiration details (honey cup) (None)  Pharyngeal - Honey Syringe (None)  Penetration/Aspiration details (honey syringe) (None)  Pharyngeal - Nectar Teaspoon (None)  Penetration/Aspiration details (nectar teaspoon) (None)  Pharyngeal - Nectar Cup (None)  Penetration/Aspiration details (nectar cup) (None)  Pharyngeal - Nectar Straw (None)   Penetration/Aspiration details (nectar straw) (None)  Pharyngeal - Nectar Syringe (None)  Penetration/Aspiration details (nectar syringe) (None)  Pharyngeal - Ice Chips (None)  Penetration/Aspiration details (ice chips) (None)  Pharyngeal - Thin Teaspoon (None)  Penetration/Aspiration details (thin teaspoon) (None)  Pharyngeal - Thin Cup (None)  Penetration/Aspiration details (thin cup) (None)  Pharyngeal - Thin Straw (None)  Penetration/Aspiration details (thin straw) (None)  Pharyngeal - Thin Syringe (None)  Penetration/Aspiration details (thin syringe') (None)  Pharyngeal - Puree (None)  Penetration/Aspiration details (puree) (None)  Pharyngeal - Mechanical Soft (None)  Penetration/Aspiration details (mechanical soft) (None)  Pharyngeal - Regular (None)  Penetration/Aspiration details (regular) (None)  Pharyngeal - Multi-consistency (None)  Penetration/Aspiration details (multi-consistency) (None)  Pharyngeal - Pill (None)  Penetration/Aspiration details (pill) (None)  Pharyngeal Comment (None)  No flowsheet data found.  No flowsheet data found.        Harlon Ditty, MA CCC-SLP [phone]  Claudine Mouton 04/01/2015, 10:31 AM     Labs:  CBC:  Recent Labs  03/28/15 0357 04/01/15 0450 04/02/15 0537 04/03/15 0545  WBC 15.4* 14.8* 16.9* 16.9*  HGB 8.4* 6.4* 9.1* 9.0*  HCT 24.6* 19.5* 28.7* 29.3*  PLT 131* 297 364 370    COAGS:  Recent Labs  03/27/15 2348  INR 1.06  APTT 40*    BMP:  Recent Labs  03/31/15 0350 04/01/15 0450 04/02/15 0537 04/02/15 1201 04/03/15 0545  NA 136 135 135  --  132*  K 4.8 5.1 5.8* 5.7* 4.9  CL 102 104 103  --  100*  CO2 25 24 25   --  24  GLUCOSE 176* 185* 163*  --  195*  BUN 48* 53* 48*  --  45*  CALCIUM 8.2* 8.0* 8.5*  --  8.3*  CREATININE 0.98 0.89 0.80  --  0.75  GFRNONAA 53* >60 >60  --  >60  GFRAA >60 >60 >60  --  >60    LIVER FUNCTION TESTS:  Recent Labs  03/24/15 0533 03/28/15 0357 03/29/15 0528 03/31/15 0350  BILITOT  0.5 0.2* 0.4 0.4  AST 17 38 33 26  ALT 10* 18 18 14   ALKPHOS 63 86 98 98  PROT 6.6 5.6* 5.6* 5.9*  ALBUMIN 2.6* 1.9* 1.7* 1.8*    TUMOR MARKERS: No results for input(s): AFPTM, CEA, CA199, CHROMGRNA in the last 8760 hours.  Assessment and Plan:  Abdominal abscesses post small bowel obstruction surgery 03/24/15 Scheduled now for LUQ and left lat abd abscess drain placement in IR Risks and Benefits discussed with the patient's family including bleeding, infection, damage to adjacent structures, bowel perforation/fistula connection, and sepsis. All of the patient's questions were answered, patient's family is agreeable to proceed. Consent signed and in chart.   Thank you for this interesting consult.  I greatly enjoyed meeting [name] and look forward to participating in their care.  Signed: Chayse Zatarain A 04/03/2015, 11:29 AM   I spent a total of 40 Minutes    in face to face in clinical consultation, greater than 50% of which was counseling/coordinating care for abd abscess drains

## 2015-04-03 NOTE — Care Management Note (Signed)
Medicare Important Message given? YES  Date Medicare IM given: 04/03/15 Medicare IM given by: Coda Filler RN CCM 

## 2015-04-04 LAB — COMPREHENSIVE METABOLIC PANEL
ALBUMIN: 1.9 g/dL — AB (ref 3.5–5.0)
ALT: 26 U/L (ref 14–54)
ANION GAP: 8 (ref 5–15)
AST: 45 U/L — ABNORMAL HIGH (ref 15–41)
Alkaline Phosphatase: 139 U/L — ABNORMAL HIGH (ref 38–126)
BILIRUBIN TOTAL: 0.5 mg/dL (ref 0.3–1.2)
BUN: 46 mg/dL — ABNORMAL HIGH (ref 6–20)
CHLORIDE: 103 mmol/L (ref 101–111)
CO2: 21 mmol/L — ABNORMAL LOW (ref 22–32)
CREATININE: 0.83 mg/dL (ref 0.44–1.00)
Calcium: 8.2 mg/dL — ABNORMAL LOW (ref 8.9–10.3)
GFR calc Af Amer: >60 mL/min (ref >60)
GFR calc non Af Amer: >60 mL/min (ref >60)
Glucose, Bld: 174 mg/dL — ABNORMAL HIGH (ref 65–99)
Potassium: 4.7 mmol/L (ref 3.5–5.1)
SODIUM: 132 mmol/L — AB (ref 135–145)
TOTAL PROTEIN: 6.8 g/dL (ref 6.5–8.1)

## 2015-04-04 LAB — DIFFERENTIAL
Basophils Absolute: 0 10*3/uL (ref 0.0–0.1)
Basophils Relative: 0 % (ref 0–1)
Eosinophils Absolute: 0.2 10*3/uL (ref 0.0–0.7)
Eosinophils Relative: 1 % (ref 0–5)
LYMPHS ABS: 0.7 10*3/uL (ref 0.7–4.0)
Lymphocytes Relative: 4 % — ABNORMAL LOW (ref 12–46)
Monocytes Absolute: 0.5 10*3/uL (ref 0.1–1.0)
Monocytes Relative: 3 % (ref 3–12)
NEUTROS ABS: 15.3 10*3/uL — AB (ref 1.7–7.7)
Neutrophils Relative %: 92 % — ABNORMAL HIGH (ref 43–77)

## 2015-04-04 LAB — CBC
HEMATOCRIT: 28.3 % — AB (ref 36.0–46.0)
HEMOGLOBIN: 8.8 g/dL — AB (ref 12.0–15.0)
MCH: 25.9 pg — AB (ref 26.0–34.0)
MCHC: 31.1 g/dL (ref 30.0–36.0)
MCV: 83.2 fL (ref 78.0–100.0)
PLATELETS: 465 10*3/uL — AB (ref 150–400)
RBC: 3.4 MIL/uL — ABNORMAL LOW (ref 3.87–5.11)
RDW: 21.7 % — ABNORMAL HIGH (ref 11.5–15.5)
WBC: 16.7 10*3/uL — ABNORMAL HIGH (ref 4.0–10.5)

## 2015-04-04 LAB — PHOSPHORUS: PHOSPHORUS: 3.8 mg/dL (ref 2.5–4.6)

## 2015-04-04 LAB — PREALBUMIN: Prealbumin: 13.7 mg/dL — ABNORMAL LOW (ref 18–38)

## 2015-04-04 LAB — GLUCOSE, CAPILLARY
GLUCOSE-CAPILLARY: 136 mg/dL — AB (ref 65–99)
GLUCOSE-CAPILLARY: 125 mg/dL — AB (ref 65–99)
Glucose-Capillary: 175 mg/dL — ABNORMAL HIGH (ref 65–99)
Glucose-Capillary: 141 mg/dL — ABNORMAL HIGH (ref 65–99)
Glucose-Capillary: 158 mg/dL — ABNORMAL HIGH (ref 65–99)

## 2015-04-04 LAB — TRIGLYCERIDES: Triglycerides: 85 mg/dL (ref <150)

## 2015-04-04 LAB — MAGNESIUM: MAGNESIUM: 1.5 mg/dL — AB (ref 1.7–2.4)

## 2015-04-04 MED ORDER — MAGNESIUM SULFATE 4 GM/100ML IV SOLN
4.0000 g | Freq: Once | INTRAVENOUS | Status: AC
  Administered 2015-04-04: 4 g via INTRAVENOUS
  Filled 2015-04-04: qty 100

## 2015-04-04 MED ORDER — TRACE MINERALS CR-CU-MN-SE-ZN 10-1000-500-60 MCG/ML IV SOLN
INTRAVENOUS | Status: DC
  Filled 2015-04-04: qty 1680

## 2015-04-04 MED ORDER — TRACE MINERALS CR-CU-MN-SE-ZN 10-1000-500-60 MCG/ML IV SOLN
INTRAVENOUS | Status: AC
  Administered 2015-04-04: 17:00:00 via INTRAVENOUS
  Filled 2015-04-04: qty 1200

## 2015-04-04 MED ORDER — ALTEPLASE 2 MG IJ SOLR
2.0000 mg | Freq: Once | INTRAMUSCULAR | Status: AC
  Administered 2015-04-04: 2 mg
  Filled 2015-04-04: qty 2

## 2015-04-04 MED ORDER — FAT EMULSION 20 % IV EMUL
240.0000 mL | INTRAVENOUS | Status: DC
  Filled 2015-04-04: qty 250

## 2015-04-04 MED ORDER — FAT EMULSION 20 % IV EMUL
240.0000 mL | INTRAVENOUS | Status: AC
  Administered 2015-04-04: 240 mL via INTRAVENOUS
  Filled 2015-04-04: qty 250

## 2015-04-04 NOTE — Progress Notes (Signed)
[facility] Progress Note  11 Days Post-Op  Subjective: Pt is moaning in pain.  No N/V per patient.  C/o abdominal pain in middle of abdomen and over drains.  Urinating through foley.  3BM's recorded on 04/02/15, none yesterday.  Working with PT, but reluctant.  No N/V, tolerating some D3 diet, but anorexic, eating very little.  Objective: Vital signs in last 24 hours: Temp:  [98 F (36.7 C)-99.2 F (37.3 C)] 99.2 F (37.3 C) (05/23 0447) Pulse Rate:  [94-109] 109 (05/23 0447) Resp:  [11-21] 18 (05/23 0447) BP: (110-188)/(57-88) 154/66 mmHg (05/23 0447) SpO2:  [94 %-100 %] 100 % (05/23 0447) Last BM Date: 04/01/15  Intake/Output from previous day: 05/22 0701 - 05/23 0700 In: 1234 [P.O.:60; I.V.:199.3; TPN:954.7] Out: 3190 [Urine:3100; Drains:90] Intake/Output this shift:    PE: Gen: Alert, NAD, pleasant Abd: Soft, ND, tender in the upper abdomen and over left lateral IR drains, drains with yellow clear drainage, +BS, no HSM, incisions C/D/I with staples in place over midline incision, few lap staple sites and drain removed   Lab Results:   Recent Labs  04/02/15 0537 04/03/15 0545  WBC 16.9* 16.9*  HGB 9.1* 9.0*  HCT 28.7* 29.3*  PLT 364 370   BMET  Recent Labs  04/02/15 0537 04/02/15 1201 04/03/15 0545  NA 135  --  132*  K 5.8* 5.7* 4.9  CL 103  --  100*  CO2 25  --  24  GLUCOSE 163*  --  195*  BUN 48*  --  45*  CREATININE 0.80  --  0.75  CALCIUM 8.5*  --  8.3*   PT/INR  Recent Labs  04/03/15 1140  LABPROT 15.5*  INR 1.22   CMP     Component Value Date/Time   NA 132* 04/03/2015 0545   K 4.9 04/03/2015 0545   CL 100* 04/03/2015 0545   CO2 24 04/03/2015 0545   GLUCOSE 195* 04/03/2015 0545   BUN 45* 04/03/2015 0545   CREATININE 0.75 04/03/2015 0545   CALCIUM 8.3* 04/03/2015 0545   PROT 5.9* 03/31/2015 0350   ALBUMIN 1.8* 03/31/2015 0350   AST 26 03/31/2015 0350   ALT 14 03/31/2015 0350   ALKPHOS 98 03/31/2015 0350   BILITOT  0.4 03/31/2015 0350   GFRNONAA >60 04/03/2015 0545   GFRAA >60 04/03/2015 0545   Lipase     Component Value Date/Time   LIPASE 30 03/18/2015 0714       Studies/Results: Ct Abdomen Pelvis W Contrast  04/02/2015   CLINICAL DATA:  Nonspecific abdominal pain. Status post recent laparotomy for small bowel obstruction.  EXAM: CT ABDOMEN AND PELVIS WITH CONTRAST  TECHNIQUE: Multidetector CT imaging of the abdomen and pelvis was performed using the standard protocol following bolus administration of intravenous contrast.  CONTRAST:  47mL OMNIPAQUE IOHEXOL 300 MG/ML SOLN, OMNIPAQUE IOHEXOL 300 MG/ML SOLN  COMPARISON:  Renal ultrasound dated 03/25/2015 and abdomen and pelvis CT dated 03/18/2015.  FINDINGS: Cholecystectomy clips. Interval midline surgical scar in the skin clips at the level of the pelvis. Loculated fluid with peripheral rim enhancement beneath the left hemidiaphragm and partially surrounding the spleen. This measures 7.7 x 5.3 cm on axial image 16. This measures 7.7 cm in length on coronal image number 92.  There is an additional fluid collection in the lateral aspect of the left mid abdomen, measuring 8.3 x 4.2 cm on axial image number 31. This also has peripheral rim enhancement. This measures 9.8 cm in length on  coronal image number 64.  There are dilated small bowel loops in the lower abdomen and upper pelvis. Above the urinary bladder, one of these loops contains fluid, small amount of oral contrast and some gas. No definite separate fluid collection is seen.  A Foley catheter is in the urinary bladder with associated air in the bladder. No free peritoneal air seen. Atheromatous arterial calcifications, including the coronary arteries. Cholecystectomy clips.  Small left pleural effusion. Bilateral lower lobe atelectasis. Multiple colonic diverticula. Small left inguinal hernia containing fat. Diffuse subcutaneous edema. Lumbar and lower thoracic spine degenerative changes and  scoliosis.  IMPRESSION: 1. 7.7 x 7.7 x 5.3 cm fluid collection with rim enhancement beneath the left hemidiaphragm and partially surrounding the spleen. This is suspicious for an abscess. 2. 9.8 x 8.3 x 4.2 cm arm left mid abdominal fluid collection with peripheral rim enhancement, suspicious for an abscess. 3. Focal ileus or partial obstruction involving small bowel in the inferior abdomen and upper pelvis. 4. Small left pleural effusion. 5. Bilateral lower lobe atelectasis. 6. Colonic diverticulosis.   Electronically Signed   By: Beckie Salts M.D.   On: 04/02/2015 17:38    Anti-infectives: Anti-infectives    Start     Dose/Rate Route Frequency Ordered Stop   03/26/15 1600  cefTRIAXone (ROCEPHIN) 2 g in dextrose 5 % 50 mL IVPB - Premix    Comments:  Pharmacy may adjust dosing strength / duration / interval for maximal efficacy   2 g 100 mL/hr over 30 Minutes Intravenous Every 24 hours 03/26/15 1007 03/30/15 1535   03/26/15 1200  metroNIDAZOLE (FLAGYL) IVPB 500 mg     500 mg 100 mL/hr over 60 Minutes Intravenous Every 6 hours 03/26/15 1007 03/30/15 1850   03/25/15 1600  cefOXitin (MEFOXIN) 1 g in dextrose 5 % 50 mL IVPB  Status:  Discontinued     1 g 100 mL/hr over 30 Minutes Intravenous Every 12 hours 03/25/15 0801 03/26/15 1007   03/25/15 0800  cefOXitin (MEFOXIN) 1 g in dextrose 5 % 50 mL IVPB  Status:  Discontinued     1 g 100 mL/hr over 30 Minutes Intravenous 3 times per day 03/25/15 0752 03/25/15 0758   03/24/15 1700  cefOXitin (MEFOXIN) 1 g in dextrose 5 % 50 mL IVPB     1 g 100 mL/hr over 30 Minutes Intravenous Every 6 hours 03/24/15 1511 03/25/15 0512   03/24/15 0915  cefOXitin (MEFOXIN) 2 g in dextrose 5 % 50 mL IVPB    Comments:  Pharmacy may adjust dosing strength, interval, or rate of medication as needed for optimal therapy for the patient Send with patient on call to the OR.  Anesthesia to complete antibiotic administration <83min prior to incision per [facility].   2  g 100 mL/hr over 30 Minutes Intravenous On call to O.R. 03/24/15 0910 03/24/15 1042       Assessment/Plan POD #11, s/p lap converted to open LOA for SBO, Dr. Daphine Deutscher -SLP following, On D3 diet -Mobilize as able s/p CVA 03-27-15 -IV pain meds prn for pain control, limit narcotics if possible -Consider weaning TNP back to encourage appetite -Abx: Rocephin/Flagyl D5/5 now discontinued -Pulmonary toilet -Drain has been removed 03/27/15 -Remove staples prior to discharge around POD #14 and replace with steristrips -New CT on 04/02/15 shows 2 intra-abdominal fluid collections, s/p drainage on 04/03/15 - left upper abdomen & Left lateral abdomen, pending cultures Leukocytosis -16.9, intra-abdominal fluid collections ABL Anemia -Hgb 9.0 yesterday, 2 units pRBC on Friday ?  CVA -per neurology and primary   DVT prophylaxis SCDs, heparin held for thrombocytopenia per primary service     LOS: 17 days    DORT, Aundra Millet 04/04/2015, 7:39 AM Pager: [phone]

## 2015-04-04 NOTE — Progress Notes (Signed)
Pt has white coating on tongue and lip --thrush--yeast--NP notified. SPR, RN

## 2015-04-04 NOTE — Progress Notes (Signed)
PARENTERAL NUTRITION CONSULT NOTE - follow up  Pharmacy Consult for TPN Indication: bowel obstruction  Allergies  Allergen Reactions  . Amlodipine Besylate     REACTION: dizzy  . Aspirin   . Atenolol     REACTION: fatigue  . Benazepril     cough  . Benicar [Olmesartan Medoxomil]     HA  . Cozaar     nausea  . Hydrochlorothiazide W-Triamterene     REACTION: dizzy  . Hydrocodone     REACTION: HA  . Hydroxyzine Pamoate   . Iodine   . Lisinopril     REACTION: tired, cough  . Penicillins Itching    tolerates cephalosporins OK  . Pravastatin     myalgias  . Prednisolone     My stomach hurts  . Tramadol Hcl     REACTION: HA    Patient Measurements: Height: 5' 4"  (162.6 cm) Weight: 149 lb 4 oz (67.7 kg) IBW/kg (Calculated) : 54.7 Adjusted Body Weight: 57.5kg   Vital Signs: Temp: 99.2 F (37.3 C) (05/23 0447) Temp Source: Oral (05/23 0447) BP: 154/66 mmHg (05/23 0447) Pulse Rate: 109 (05/23 0447) Intake/Output from previous day: 05/22 0701 - 05/23 0700 In: 4680 [P.O.:60; I.V.:199.3; TPN:954.7] Out: 3190 [Urine:3100; Drains:90] Intake/Output from this shift:    Labs:  Recent Labs  04/02/15 0537 04/03/15 0545 04/03/15 1140 04/04/15 0650  WBC 16.9* 16.9*  --  16.7*  HGB 9.1* 9.0*  --  8.8*  HCT 28.7* 29.3*  --  28.3*  PLT 364 370  --  465*  INR  --   --  1.22  --      Recent Labs  04/02/15 0537 04/02/15 1201 04/03/15 0545 04/04/15 0650  NA 135  --  132* 132*  K 5.8* 5.7* 4.9 4.7  CL 103  --  100* 103  CO2 25  --  24 21*  GLUCOSE 163*  --  195* 174*  BUN 48*  --  45* 46*  CREATININE 0.80  --  0.75 0.83  CALCIUM 8.5*  --  8.3* 8.2*  MG 1.7  --   --  1.5*  PHOS 4.6  --   --  3.8  PROT  --   --   --  6.8  ALBUMIN  --   --   --  1.9*  AST  --   --   --  45*  ALT  --   --   --  26  ALKPHOS  --   --   --  139*  BILITOT  --   --   --  0.5   Estimated Creatinine Clearance: 52 mL/min (by C-G formula based on Cr of 0.83).    Recent Labs  04/03/15 2000 04/03/15 2343 04/04/15 0353  GLUCAP 124* 156* 136*    Insulin Requirements: 14 units Novolog on 5/22  Current Nutrition:  Dysphagia 3 diet (start 5/20) Ensure BID ordered on 5/20 (taking one can most days)  IVF: NS @ 10 ml/hr  Central access: PICC 5/11 TPN start date: 5/11  ASSESSMENT  HPI: 81 year old female with history of hypertension, coronary artery disease, hyperlipidemia, iron deficiency anemia, depression, osteoporosis, mild chronic kidney disease presented to the ED with ongoing nausea for 4 days and diffuse crampy lower abdominal pain since one day prior to admission. Associated poor by mouth intake and generalized weakness. Patient was found to have small bowel obstruction.  Pharmacy consulted to start TPN.  Significant events:  5/12: laparoscopic lysis of adhesions with laparotomy and decompression of bowel 5/13: new AKI 5/15: Continue NG tube and NPO d/t bilious output pending BM.   5/19: Remove NGT 5/20 Completed swallow eval - starting dysphagia diet 5/21: TPN changed to electrolytes free d/t high K+; given kayexalate.  Ate about 10% of lunch 5/22: IR drainage of 2 abd abscesses, drains placed 5/23: Weak but with little appetite, no n/v but with post-op pain  Today:   Glucose (goal <150) - CBGs mostly in 150-200 range  Electrolytes - wnl except for Mg borderline low; CoCa ~9.8  Renal -  SCr improved to wnl  WBC elevated; hgb stable at 9  LFTs - Alk phos bumped; slightly elevated.  Others wnl  TGs - wnl (5/16), not yet resulted today  Prealbumin: 2.7 (5/16), not yet resulted today  NUTRITIONAL GOALS                                                                                             RD recs (5/18): 75-85 g/day protein, 1500-1700 Kcal/day. Clinimix E 5/15 at a goal rate of 50m/hr + 20% fat emulsion at 164mhr to provide: 84 g/day  protein, 1672 Kcal/day.  PLAN                                                                                                                         1) ) At 1800 today:  Reduce Clinimix 5/15 (without lytes) to 50 ml/hr.  Surgery is hoping that gradual weaning will stimulate appetite.  Likely can add back lytes tomorrow.  Continue 20% fat emulsion at 1040mr.   TPN to contain standard multivitamins and trace elements.  Mg 4g IV x 1  Maintain [facility]F at [facility] q4h  TPN lab panels on Mondays & Thursdays.  F/u LFTs  F/u weaning TPN when pt is tolerating PO intake.   DreReuel BoomharmD Pager: [phone]/2016, 7:57 AM

## 2015-04-04 NOTE — Progress Notes (Signed)
Patient ID: TAMMELA BALES, female   DOB: 25-Apr-1933, 81 y.o.   MRN: [id number]    Referring Physician(s): CCS  Subjective:  Pt moaning, still c/o abd discomfort; not eating much; denies N/V  Allergies: Amlodipine besylate; Aspirin; Atenolol; Benazepril; Benicar; Cozaar; Hydrochlorothiazide w-triamterene; Hydrocodone; Hydroxyzine pamoate; Iodine; Lisinopril; Penicillins; Pravastatin; Prednisolone; and Tramadol hcl  Medications: Prior to Admission medications   Medication Sig Start Date End Date Taking? Authorizing Provider  acetaminophen (TYLENOL) 650 MG CR tablet Take 1,300 mg by mouth every 8 (eight) hours as needed for pain.    Yes Historical Provider, MD  aspirin 325 MG tablet Take 325 mg by mouth daily.   Yes Historical Provider, MD  cholecalciferol (VITAMIN D) 1000 UNITS tablet Take 2 tablets (2,000 Units total) by mouth daily. 04/17/13  Yes Aleksei Plotnikov V, MD  Cyanocobalamin (VITAMIN B-12) 1000 MCG SUBL Place 1 tablet (1,000 mcg total) under the tongue daily. 12/03/13  Yes Aleksei Plotnikov V, MD  furosemide (LASIX) 20 MG tablet Take 1 tablet (20 mg total) by mouth daily as needed for edema. Patient taking differently: Take 20 mg by mouth daily.  03/10/15 03/09/16 Yes Aleksei Plotnikov V, MD  metFORMIN (GLUCOPHAGE) 500 MG tablet TAKE 1 TABLET TWICE DAILY  WITH  A  MEAL 08/23/14  Yes Aleksei Plotnikov V, MD  OVER THE COUNTER MEDICATION Apply 1 application topically 2 (two) times daily as needed (dryness). Diabetic Lotion.   Yes Historical Provider, MD  oxybutynin (DITROPAN) 5 MG tablet Take 1 tablet (5 mg total) by mouth 2 (two) times daily. 11/25/14  Yes Aleksei Plotnikov V, MD  Polyethyl Glycol-Propyl Glycol (SYSTANE OP) Apply 1-2 drops to eye daily as needed (dry eyes).   Yes Historical Provider, MD  LORazepam (ATIVAN) 1 MG tablet Take 0.5 tablets (0.5 mg total) by mouth every 8 (eight) hours as needed for anxiety. Patient not taking: Reported on 03/18/2015 05/07/13   Bethann Berkshire,  MD     Vital Signs: BP 154/73 mmHg  Pulse 106  Temp(Src) 98.6 F (37 C) (Oral)  Resp 18  Ht 5\' 4"  (1.626 m)  Wt 149 lb 4 oz (67.7 kg)  BMI 25.61 kg/m2  SpO2 100%  Physical Exam left abd drains intact, insertion sites ok, mildly tender, outputs 40-50 cc's each serous fluid; cx's pend  Imaging: Ct Abdomen Pelvis W Contrast  04/02/2015   CLINICAL DATA:  Nonspecific abdominal pain. Status post recent laparotomy for small bowel obstruction.  EXAM: CT ABDOMEN AND PELVIS WITH CONTRAST  TECHNIQUE: Multidetector CT imaging of the abdomen and pelvis was performed using the standard protocol following bolus administration of intravenous contrast.  CONTRAST:  104mL OMNIPAQUE IOHEXOL 300 MG/ML SOLN, 45m OMNIPAQUE IOHEXOL 300 MG/ML SOLN  COMPARISON:  Renal ultrasound dated 03/25/2015 and abdomen and pelvis CT dated 03/18/2015.  FINDINGS: Cholecystectomy clips. Interval midline surgical scar in the skin clips at the level of the pelvis. Loculated fluid with peripheral rim enhancement beneath the left hemidiaphragm and partially surrounding the spleen. This measures 7.7 x 5.3 cm on axial image 16. This measures 7.7 cm in length on coronal image number 92.  There is an additional fluid collection in the lateral aspect of the left mid abdomen, measuring 8.3 x 4.2 cm on axial image number 31. This also has peripheral rim enhancement. This measures 9.8 cm in length on coronal image number 64.  There are dilated small bowel loops in the lower abdomen and upper pelvis. Above the urinary bladder, one of these loops contains fluid,  small amount of oral contrast and some gas. No definite separate fluid collection is seen.  A Foley catheter is in the urinary bladder with associated air in the bladder. No free peritoneal air seen. Atheromatous arterial calcifications, including the coronary arteries. Cholecystectomy clips.  Small left pleural effusion. Bilateral lower lobe atelectasis. Multiple colonic diverticula. Small  left inguinal hernia containing fat. Diffuse subcutaneous edema. Lumbar and lower thoracic spine degenerative changes and scoliosis.  IMPRESSION: 1. 7.7 x 7.7 x 5.3 cm fluid collection with rim enhancement beneath the left hemidiaphragm and partially surrounding the spleen. This is suspicious for an abscess. 2. 9.8 x 8.3 x 4.2 cm arm left mid abdominal fluid collection with peripheral rim enhancement, suspicious for an abscess. 3. Focal ileus or partial obstruction involving small bowel in the inferior abdomen and upper pelvis. 4. Small left pleural effusion. 5. Bilateral lower lobe atelectasis. 6. Colonic diverticulosis.   Electronically Signed   By: Beckie Salts M.D.   On: 04/02/2015 17:38   Dg Swallowing Func-speech Pathology  04/01/2015    Objective Swallowing Evaluation:    Patient Details  Name: KIYO HEAL MRN: [id number] Date of Birth: [birth year]  Today's Date: 04/01/2015 Time: SLP Start Time (ACUTE ONLY): 0835-SLP Stop Time (ACUTE ONLY): 0857 SLP Time Calculation (min) (ACUTE ONLY): 22 min  Past Medical History:  Past Medical History  Diagnosis Date  . CAD (coronary artery disease)     s/p stenting of LAD 1999- cath 5-08 EF normal LAD 30-40% restenosis. D1  50% D2 80% LCX & RCA minimal plaque  . HTN (hypertension)   . Hyperlipemia   . Anemia     iron deficiency  . Depression   . DVT (deep venous thrombosis)   . Gout   . Osteoporosis   . Pancreatitis   . GERD (gastroesophageal reflux disease)   . Renal insufficiency     Cr 1.2-1.3  . Obesity   . Diabetes mellitus   . Polyarthritis     DJD/ possible PMR  . Vitamin D deficiency   . B12 deficiency   . Tinnitus   . Anxiety   . Aneurysm, thoracic aortic   . Constipation   . Urinary frequency   . Vertigo   . Chronic back pain    Past Surgical History:  Past Surgical History  Procedure Laterality Date  . Hemorrhoid surgery    . Abdominal hysterectomy    . Cholecystectomy    . Tubal ligation    . Coronary angioplasty with stent placement  1999    LAD stent  .  Cardiac catheterization  2008    L main 20%, LAD stent patent, D1 50%, D2 80% (small), RCA 20%, EF 55-60%   . Laparoscopy N/A 03/24/2015    Procedure: LAPAROSCOPY DIAGNOSTIC;  Surgeon: Luretha Murphy, MD;   Location: [facility];  Service: General;  Laterality: N/A;  . Laparoscopic lysis of adhesions N/A 03/24/2015    Procedure: LAPAROSCOPIC LYSIS OF ADHESIONS;  Surgeon: Luretha Murphy,  MD;  Location: [facility];  Service: General;  Laterality: N/A;  . Laparotomy N/A 03/24/2015    Procedure: LAPAROTOMY with decompression of bowel;  Surgeon: Luretha Murphy, MD;  Location: [facility];  Service: General;  Laterality: N/A;   HPI:  Other Pertinent Information: Pt is a 81 year old female admitted 03/18/15  with SBO secondary to adhesions. She failed conservative therapy and  underwent laparoscopic lysis of adhesions/laparotomy with bowel  decompression on 03/24/15. Her hospital course was complicated by sepsis  secondary to peritoneal contamination with transfer to the SDU on 03/25/15.  She also developed left-sided facial droop/right arm weakness 03/27/15 with  CT scan showing a 4 mm lacunar infarct in the left lentiform nucleus. MRI  failed to show any abnormality. After removal of NG for suction, MD  ordered MBS prior to initiating diet.   No Data Recorded  Assessment / Plan / Recommendation CHL IP CLINICAL IMPRESSIONS 04/01/2015  Therapy Diagnosis Mild pharyngeal phase dysphagia;Mild oral phase  dysphagia  Clinical Impression Mild oral dysphagia characterized by decreased lingual  formation of bolus, improved with straw sips and improving progressively  during exam. oropharyngeal dysphagia also mild with no penetration or  aspiration. Suspect mild standing secretions intially during exam as  pharyngeal residuals decreased and timing of swallow improved with each  trial. Though swallow was initially delayed, by end of exam pt taking  large consecutive sips of thin with minimal delay and minimal residuals.  Recommend pt initiate a dys 3  (mechanical soft) diet with basic aspiration  precautions of upright posture and occasional second swallow to clear oral  cavity and orophrayngeal residuals. SLP will f/u for tolerance during  acute stay.       CHL IP TREATMENT RECOMMENDATION 04/01/2015  Treatment Recommendations Therapy as outlined in treatment plan below     CHL IP DIET RECOMMENDATION 04/01/2015  SLP Diet Recommendations Dysphagia 3 (Mech soft);Thin  Liquid Administration via (None)  Medication Administration Whole meds with liquid  Compensations Multiple dry swallows after each bite/sip  Postural Changes and/or Swallow Maneuvers (None)     CHL IP OTHER RECOMMENDATIONS 04/01/2015  Recommended Consults (None)  Oral Care Recommendations Oral care BID  Other Recommendations (None)     CHL IP FOLLOW UP RECOMMENDATIONS 03/29/2015  Follow up Recommendations 24 hour supervision/assistance     CHL IP FREQUENCY AND DURATION 04/01/2015  Speech Therapy Frequency (ACUTE ONLY) min 2x/week  Treatment Duration 2 weeks     Pertinent Vitals/Pain NA    SLP Swallow Goals No flowsheet data found.  No flowsheet data found.    CHL IP REASON FOR REFERRAL 04/01/2015  Reason for Referral Objectively evaluate swallowing function     CHL IP ORAL PHASE 04/01/2015  Lips (None)  Tongue (None)  Mucous membranes (None)  Nutritional status (None)  Other (None)  Oxygen therapy (None)  Oral Phase Impaired  Oral - Pudding Teaspoon (None)  Oral - Pudding Cup (None)  Oral - Honey Teaspoon (None)  Oral - Honey Cup (None)  Oral - Honey Syringe (None)  Oral - Nectar Teaspoon (None)  Oral - Nectar Cup (None)  Oral - Nectar Straw (None)  Oral - Nectar Syringe (None)  Oral - Ice Chips (None)  Oral - Thin Teaspoon (None)  Oral - Thin Cup (None)  Oral - Thin Straw (None)  Oral - Thin Syringe (None)  Oral - Puree (None)  Oral - Mechanical Soft (None)  Oral - Regular (None)  Oral - Multi-consistency (None)  Oral - Pill (None)  Oral Phase - Comment (None)      CHL IP PHARYNGEAL PHASE 04/01/2015   Pharyngeal Phase Impaired  Pharyngeal - Pudding Teaspoon (None)  Penetration/Aspiration details (pudding teaspoon) (None)  Pharyngeal - Pudding Cup (None)  Penetration/Aspiration details (pudding cup) (None)  Pharyngeal - Honey Teaspoon (None)  Penetration/Aspiration details (honey teaspoon) (None)  Pharyngeal - Honey Cup (None)  Penetration/Aspiration details (honey cup) (None)  Pharyngeal - Honey Syringe (None)  Penetration/Aspiration details (honey syringe) (None)  Pharyngeal - Nectar Teaspoon (  None)  Penetration/Aspiration details (nectar teaspoon) (None)  Pharyngeal - Nectar Cup (None)  Penetration/Aspiration details (nectar cup) (None)  Pharyngeal - Nectar Straw (None)  Penetration/Aspiration details (nectar straw) (None)  Pharyngeal - Nectar Syringe (None)  Penetration/Aspiration details (nectar syringe) (None)  Pharyngeal - Ice Chips (None)  Penetration/Aspiration details (ice chips) (None)  Pharyngeal - Thin Teaspoon (None)  Penetration/Aspiration details (thin teaspoon) (None)  Pharyngeal - Thin Cup (None)  Penetration/Aspiration details (thin cup) (None)  Pharyngeal - Thin Straw (None)  Penetration/Aspiration details (thin straw) (None)  Pharyngeal - Thin Syringe (None)  Penetration/Aspiration details (thin syringe') (None)  Pharyngeal - Puree (None)  Penetration/Aspiration details (puree) (None)  Pharyngeal - Mechanical Soft (None)  Penetration/Aspiration details (mechanical soft) (None)  Pharyngeal - Regular (None)  Penetration/Aspiration details (regular) (None)  Pharyngeal - Multi-consistency (None)  Penetration/Aspiration details (multi-consistency) (None)  Pharyngeal - Pill (None)  Penetration/Aspiration details (pill) (None)  Pharyngeal Comment (None)      No flowsheet data found.  No flowsheet data found.        Harlon Ditty, [place] CCC-SLP [phone]  Claudine Mouton 04/01/2015, 10:31 AM    Ct Image Guided Drainage Percut Cath  Peritoneal Retroperit  04/04/2015   CLINICAL DATA:   81 year old with postoperative fluid collections. Concern for intra-abdominal abscesses.  EXAM: CT-GUIDED DRAIN PLACEMENT IN LEFT UPPER ABDOMINAL FLUID COLLECTION  CT-GUIDED DRAIN PLACEMENT IN LEFT LATERAL ABDOMINAL FLUID COLLECTION  Physician: Rachelle Hora. Lowella Dandy, MD  FLUOROSCOPY TIME:  None  MEDICATIONS: 4 mg versed, 100 mcg of fentanyl. A radiology nurse monitored the patient for moderate sedation.  ANESTHESIA/SEDATION: Moderate sedation time: 50 min  PROCEDURE: Informed consent was obtained for CT-guided drain placements. Patient was placed supine on the CT scanner. Images through the abdomen were obtained. The collections in the left upper abdomen and lateral left abdomen were identified. The left side of the abdomen was prepped and draped in sterile fashion. The collection in the left upper abdomen and perisplenic region was initially targeted. Skin was anesthetized with 1% lidocaine. 18 gauge needle was directed into the fluid collection just posterior to the spleen and cloudy yellow fluid was aspirated. A stiff Amplatz wire was placed and the tract was dilated to accommodate a 10.2 [place] multipurpose drain. Catheter sutured to skin and attached to a suction bulb.  Attention was directed to the left lateral abdominal fluid collection. The left lateral abdomen was anesthetized with 1% lidocaine. 18 gauge needle directed into the fluid collection and cloudy yellow fluid was aspirated. Stiff Amplatz wire was placed and the tract was dilated to accommodate a 10.2 [place] multipurpose drain. Catheter sutured to skin and attached to a suction bulb.  FINDINGS: Left upper abdominal fluid collection in the perisplenic region. Drain was successfully placed just posterior to the spleen. There is a second collection in the lateral left mid abdomen. Drain successfully placed in this collection.  Estimated blood loss: Minimal  COMPLICATIONS: None  IMPRESSION: Placement of CT-guided drainage catheters in two left abdominal fluid  collections. Cloudy yellow fluid was removed from both collections. Samples were sent for culture.   Electronically Signed   By: Richarda Overlie M.D.   On: 04/04/2015 08:32   Ct Image Guided Drainage Percut Cath  Peritoneal Retroperit  04/04/2015   CLINICAL DATA:  81 year old with postoperative fluid collections. Concern for intra-abdominal abscesses.  EXAM: CT-GUIDED DRAIN PLACEMENT IN LEFT UPPER ABDOMINAL FLUID COLLECTION  CT-GUIDED DRAIN PLACEMENT IN LEFT LATERAL ABDOMINAL FLUID COLLECTION  Physician: Rachelle Hora. Lowella Dandy, MD  FLUOROSCOPY  TIME:  None  MEDICATIONS: 4 mg versed, 100 mcg of fentanyl. A radiology nurse monitored the patient for moderate sedation.  ANESTHESIA/SEDATION: Moderate sedation time: 50 min  PROCEDURE: Informed consent was obtained for CT-guided drain placements. Patient was placed supine on the CT scanner. Images through the abdomen were obtained. The collections in the left upper abdomen and lateral left abdomen were identified. The left side of the abdomen was prepped and draped in sterile fashion. The collection in the left upper abdomen and perisplenic region was initially targeted. Skin was anesthetized with 1% lidocaine. 18 gauge needle was directed into the fluid collection just posterior to the spleen and cloudy yellow fluid was aspirated. A stiff Amplatz wire was placed and the tract was dilated to accommodate a 10.2 [place] multipurpose drain. Catheter sutured to skin and attached to a suction bulb.  Attention was directed to the left lateral abdominal fluid collection. The left lateral abdomen was anesthetized with 1% lidocaine. 18 gauge needle directed into the fluid collection and cloudy yellow fluid was aspirated. Stiff Amplatz wire was placed and the tract was dilated to accommodate a 10.2 [place] multipurpose drain. Catheter sutured to skin and attached to a suction bulb.  FINDINGS: Left upper abdominal fluid collection in the perisplenic region. Drain was successfully placed just  posterior to the spleen. There is a second collection in the lateral left mid abdomen. Drain successfully placed in this collection.  Estimated blood loss: Minimal  COMPLICATIONS: None  IMPRESSION: Placement of CT-guided drainage catheters in two left abdominal fluid collections. Cloudy yellow fluid was removed from both collections. Samples were sent for culture.   Electronically Signed   By: Richarda Overlie M.D.   On: 04/04/2015 08:32    Labs:  CBC:  Recent Labs  04/01/15 0450 04/02/15 0537 04/03/15 0545 04/04/15 0650  WBC 14.8* 16.9* 16.9* 16.7*  HGB 6.4* 9.1* 9.0* 8.8*  HCT 19.5* 28.7* 29.3* 28.3*  PLT 297 364 370 465*    COAGS:  Recent Labs  03/27/15 2348 04/03/15 1140  INR 1.06 1.22  APTT 40*  --     BMP:  Recent Labs  04/01/15 0450 04/02/15 0537 04/02/15 1201 04/03/15 0545 04/04/15 0650  NA 135 135  --  132* 132*  K 5.1 5.8* 5.7* 4.9 4.7  CL 104 103  --  100* 103  CO2 24 25  --  24 21*  GLUCOSE 185* 163*  --  195* 174*  BUN 53* 48*  --  45* 46*  CALCIUM 8.0* 8.5*  --  8.3* 8.2*  CREATININE 0.89 0.80  --  0.75 0.83  GFRNONAA >60 >60  --  >60 >60  GFRAA >60 >60  --  >60 >60    LIVER FUNCTION TESTS:  Recent Labs  03/28/15 0357 03/29/15 0528 03/31/15 0350 04/04/15 0650  BILITOT 0.2* 0.4 0.4 0.5  AST 38 33 26 45*  ALT 18 18 14 26   ALKPHOS 86 98 98 139*  PROT 5.6* 5.6* 5.9* 6.8  ALBUMIN 1.9* 1.7* 1.8* 1.9*    Assessment and Plan: S/p LOA for SBO 5/13, drainage of left upper/lateral abd fluid collections 5/22; currently afebrile, WBC 16.7 (16.9), hgb 8.8; abd fluid cx's pend; plans as outlined by CCS; check f/u CT once output diminishes or if status worsens   Signed: D. Jeananne Rama 04/04/2015, 1:03 PM   I spent a total of 15 minutes  in face to face in clinical consultation/evaluation, greater than 50% of which was counseling/coordinating care for left abd  fluid collection drainages

## 2015-04-04 NOTE — Progress Notes (Signed)
Speech Language Pathology Treatment: Dysphagia  Patient Details Name: [name] MRN: [id number] DOB: [birth year] Today's Date: 04/04/2015 Time: 0800-0820 SLP Time Calculation (min) (ACUTE ONLY): 20 min  Assessment / Plan / Recommendation Clinical Impression  Pt seen with am meal to check for tolerance of recommended diet. SLP provided total assist feeding due to pt pain level and distraction from pain (repositioned/RN aware). Persistent mild oral dysphagia with decreased labial seal and bolus formation with thins, improved with straw. No signs of aspiration or residuals present though intake was minimal.  Recommend pt continue current diet with precautions.    HPI Other Pertinent Information: Pt is a 81 year old female admitted 03/18/15 with SBO secondary to adhesions. She failed conservative therapy and underwent laparoscopic lysis of adhesions/laparotomy with bowel decompression on 03/24/15. Her hospital course was complicated by sepsis secondary to peritoneal contamination with transfer to the SDU on 03/25/15. She also developed left-sided facial droop/right arm weakness 03/27/15 with CT scan showing a 4 mm lacunar infarct in the left lentiform nucleus. MRI failed to show any abnormality. After removal of NG for suction, MD ordered MBS prior to initiating diet.    Pertinent Vitals Pain Assessment: Faces Faces Pain Scale: Hurts whole lot Pain Location: abdomen Pain Descriptors / Indicators: Grimacing Pain Intervention(s): Repositioned;Patient requesting pain meds-RN notified  SLP Plan  Continue with current plan of care    Recommendations Diet recommendations: Dysphagia 3 (mechanical soft);Thin liquid Liquids provided via: Straw Medication Administration: Whole meds with liquid Supervision: Staff to assist with self feeding Compensations: Multiple dry swallows after each bite/sip Postural Changes and/or Swallow Maneuvers: Seated upright 90 degrees              Oral Care  Recommendations: Oral care BID Plan: Continue with current plan of care    GO    [facility], [place] CCC-SLP [phone]  [name] 04/04/2015, 8:26 AM

## 2015-04-04 NOTE — Progress Notes (Signed)
Progress Note   [name] WGN:[id number] DOB: 03-09-1933 DOA: 03/18/2015 PCP: [name], MD   Brief Narrative:   [name] is an 81 y.o. female with a PMH of hypertension, CAD, hyperlipidemia and diabetes who was admitted 03/18/15 with SBO secondary to adhesions. She failed conservative therapy and underwent laparoscopic lysis of adhesions/laparotomy with bowel decompression on 03/24/15. Her hospital course was complicated by sepsis secondary to peritoneal contamination with transfer to the SDU on 03/25/15. She also developed left-sided facial droop/right arm weakness 03/27/15 with CT scan showing a 4 mm lacunar infarct in the left lentiform nucleus. MRI failed to show any abnormality. Neurology is following. Barrier to discharge his ongoing need for TPN for nutritional purposes.  Assessment/Plan:   Principal Problem:   Small bowel obstruction s/p exlap/LOA/decompression 03/25/2015 complicated by severe sepsis secondary to peritonitis / abdominal pain now complicated by intra-abdominal abscess/fluid collection - Status post laparoscopic lysis of adhesions/laparotomy with bowel decompression 03/24/15. - Subsequently developed acute peritonitis/sepsis. - Initially treated with Mefoxin starting 03/25/15. Antibiotics switched to Flagyl/Rocephin 03/26/15.   - CT scan of the abdomen/pelvis done 04/02/15 which showed 2 large fluid collections.  - 2 percutaneous drains placed by IR 04/03/15 to drain fluid collections. - Has tolerated discontinuation of NG tube, diet advanced to dysphagia 04/01/15 but poor appetite persists.  Active Problems:   Facial droop / right arm weakness / possible acute CVA left lentiform nucleus - On 03/27/15, CT of the head done to evaluate symptoms showed an acute 4 mm lacunar infarct in the left lentiform nucleus. - Subsequent MRI negative, although clinical findings consistent with acute CVA. MRA negative for stenosis. - 2-D echo with normal EF, no diastolic  dysfunction noted. No intracardiac masses or thrombi. - Carotid Dopplers negative for significant stenosis (1-39% bilaterally). - Hemoglobin A1c 6.3%. Cholesterol 99. - Evaluated by neurology, no further stroke workup recommended. - Continue aspirin. - PT/OT.    Hypokalemia/hypomagnesemia - Monitor and replace electrolytes as needed.    Acute postoperative blood loss anemia - Status post 2 units of PRBCs 04/01/15. - Continue to monitor hemoglobin and transfuse for hemoglobin less than 7.    Diabetes type 2, controlled - Currently being managed with moderate scale SSI every 6 hours. CBGs 124-175. - Hemoglobin A1c 6.3%.    Anxiety state/Adjustment disorder with mixed anxiety and depressed mood - Mood has been stable.    Coronary atherosclerosis - Continue aspirin therapy.    Acute renal failure secondary to sepsis  - Resolved with IV fluids.    Right foot pain  - Radiographs negative for fracture. Degenerative changes noted.    Polymyalgia rheumatica/Chronic fatigue disorder - PT/OT.    Elevated blood pressure - Continue as needed Lopressor.    Malnutrition of moderate degree - Continue TPN for nutritional support. Can likely start to wean once diet advanced (NPO in anticipation of abscess drainage).    Acute delirium - Avoid sedating medications. Family requests patient does not get Ativan or Haldol.    Thrombocytopenia - Mild. Likely related to recent sepsis.    DVT Prophylaxis - Continue SCDs.  Code Status: Full. Family Communication: Son updated at the bedside.   Disposition Plan: Home with daughter when diet advanced, difficult to predict due to long hospitalization, ongoing need for IV pain medication, now with new fluid collections needing to be drained.   IV Access:    PICC placed 03/23/15   Procedures and diagnostic studies:   Ct Head Wo Contrast  03/27/2015   CLINICAL DATA:  RIGHT-sided facial droop. Symptoms began earlier today. Altered mental  status.  EXAM: CT HEAD WITHOUT CONTRAST  TECHNIQUE: Contiguous axial images were obtained from the base of the skull through the vertex without intravenous contrast.  COMPARISON:  05/27/2014.  FINDINGS: Possible acute 4 mm lacune, LEFT lentiform nucleus as seen on image 12 series 5. This was not clearly present on in 2015 scan.  No other areas of concern for cortical or subcortical infarction.  No hemorrhage, mass lesion, hydrocephalus, or extra-axial fluid.  Cerebral and cerebellar atrophy. Generalized white matter hypoattenuation, likely small vessel disease. Than the calvarium intact. Vascular calcification. No sinus or mastoid disease. Dense lenticular opacities.  IMPRESSION: Possible acute 4 mm lacunar infarct LEFT lentiform nucleus. If further investigation desired, and no contraindications, consider MRI brain.   Electronically Signed   By: Davonna Belling M.D.   On: 03/27/2015 16:25   Mr Maxine Glenn Head Wo Contrast  03/28/2015   CLINICAL DATA:  81 year old with right-sided facial droop. Altered mental status. Subsequent encounter.  EXAM: MRI HEAD WITHOUT CONTRAST  MRA HEAD WITHOUT CONTRAST  TECHNIQUE: Multiplanar, multiecho pulse sequences of the brain and surrounding structures were obtained without intravenous contrast. Angiographic images of the head were obtained using MRA technique without contrast.  COMPARISON:  03/27/2015 head CT.  FINDINGS: MRI HEAD FINDINGS  No acute infarct.  No intracranial hemorrhage.  Remote tiny infarct right cerebellum. Minimal small vessel disease type changes.  Incidentally noted is a prominent peri vascular space left lenticular nucleus.  Global atrophy without hydrocephalus.  No intracranial mass lesion noted on this unenhanced exam.  Cervical medullary junction unremarkable. Mild spinal stenosis C3-4.  Expanded partially empty sella without other findings of pseudotumor cerebri  Pineal region unremarkable.  No intracranial mass lesion noted on this unenhanced exam.  MRA HEAD  FINDINGS  Mild narrowing pre cavernous/ cavernous segment right internal carotid artery.  Mild ectasia left internal carotid artery cavernous segment. The minimal bulge along the left internal carotid artery cavernous segment most likely related to slight ectasia of the left ophthalmic artery origin rather than small aneurysm. Mild motion degradation limits evaluation.  Slightly ectatic anterior communicating artery without aneurysm.  Middle cerebral artery mild branch vessel irregularity bilaterally.  No significant stenosis distal vertebral arteries or basilar artery. Regions of mild irregularity and slight narrowing.  IMPRESSION: MRI HEAD  No acute infarct.  Remote tiny infarct right cerebellum.  Incidentally noted is a prominent peri vascular space left lenticular nucleus.  Global atrophy without hydrocephalus.  Expanded partially empty sella without other findings of pseudotumor cerebri  MRA HEAD FINDINGS  No medium or large size vessel significant stenosis or occlusion. Evaluation of branch vessels slightly limited by motion degradation.   Electronically Signed   By: Lacy Duverney M.D.   On: 03/28/2015 12:02   Mr Brain Wo Contrast  03/28/2015   CLINICAL DATA:  80 year old with right-sided facial droop. Altered mental status. Subsequent encounter.  EXAM: MRI HEAD WITHOUT CONTRAST  MRA HEAD WITHOUT CONTRAST  TECHNIQUE: Multiplanar, multiecho pulse sequences of the brain and surrounding structures were obtained without intravenous contrast. Angiographic images of the head were obtained using MRA technique without contrast.  COMPARISON:  03/27/2015 head CT.  FINDINGS: MRI HEAD FINDINGS  No acute infarct.  No intracranial hemorrhage.  Remote tiny infarct right cerebellum. Minimal small vessel disease type changes.  Incidentally noted is a prominent peri vascular space left lenticular nucleus.  Global atrophy without hydrocephalus.  No intracranial mass  lesion noted on this unenhanced exam.  Cervical medullary  junction unremarkable. Mild spinal stenosis C3-4.  Expanded partially empty sella without other findings of pseudotumor cerebri  Pineal region unremarkable.  No intracranial mass lesion noted on this unenhanced exam.  MRA HEAD FINDINGS  Mild narrowing pre cavernous/ cavernous segment right internal carotid artery.  Mild ectasia left internal carotid artery cavernous segment. The minimal bulge along the left internal carotid artery cavernous segment most likely related to slight ectasia of the left ophthalmic artery origin rather than small aneurysm. Mild motion degradation limits evaluation.  Slightly ectatic anterior communicating artery without aneurysm.  Middle cerebral artery mild branch vessel irregularity bilaterally.  No significant stenosis distal vertebral arteries or basilar artery. Regions of mild irregularity and slight narrowing.  IMPRESSION: MRI HEAD  No acute infarct.  Remote tiny infarct right cerebellum.  Incidentally noted is a prominent peri vascular space left lenticular nucleus.  Global atrophy without hydrocephalus.  Expanded partially empty sella without other findings of pseudotumor cerebri  MRA HEAD FINDINGS  No medium or large size vessel significant stenosis or occlusion. Evaluation of branch vessels slightly limited by motion degradation.   Electronically Signed   By: Lacy Duverney M.D.   On: 03/28/2015 12:02   Ct Abdomen Pelvis W Contrast  04/02/2015   CLINICAL DATA:  Nonspecific abdominal pain. Status post recent laparotomy for small bowel obstruction.  EXAM: CT ABDOMEN AND PELVIS WITH CONTRAST  TECHNIQUE: Multidetector CT imaging of the abdomen and pelvis was performed using the standard protocol following bolus administration of intravenous contrast.  CONTRAST:  50mL OMNIPAQUE IOHEXOL 300 MG/ML SOLN, OMNIPAQUE IOHEXOL 300 MG/ML SOLN  COMPARISON:  Renal ultrasound dated 03/25/2015 and abdomen and pelvis CT dated 03/18/2015.  FINDINGS: Cholecystectomy clips. Interval midline  surgical scar in the skin clips at the level of the pelvis. Loculated fluid with peripheral rim enhancement beneath the left hemidiaphragm and partially surrounding the spleen. This measures 7.7 x 5.3 cm on axial image 16. This measures 7.7 cm in length on coronal image number 92.  There is an additional fluid collection in the lateral aspect of the left mid abdomen, measuring 8.3 x 4.2 cm on axial image number 31. This also has peripheral rim enhancement. This measures 9.8 cm in length on coronal image number 64.  There are dilated small bowel loops in the lower abdomen and upper pelvis. Above the urinary bladder, one of these loops contains fluid, small amount of oral contrast and some gas. No definite separate fluid collection is seen.  A Foley catheter is in the urinary bladder with associated air in the bladder. No free peritoneal air seen. Atheromatous arterial calcifications, including the coronary arteries. Cholecystectomy clips.  Small left pleural effusion. Bilateral lower lobe atelectasis. Multiple colonic diverticula. Small left inguinal hernia containing fat. Diffuse subcutaneous edema. Lumbar and lower thoracic spine degenerative changes and scoliosis.  IMPRESSION: 1. 7.7 x 7.7 x 5.3 cm fluid collection with rim enhancement beneath the left hemidiaphragm and partially surrounding the spleen. This is suspicious for an abscess. 2. 9.8 x 8.3 x 4.2 cm arm left mid abdominal fluid collection with peripheral rim enhancement, suspicious for an abscess. 3. Focal ileus or partial obstruction involving small bowel in the inferior abdomen and upper pelvis. 4. Small left pleural effusion. 5. Bilateral lower lobe atelectasis. 6. Colonic diverticulosis.   Electronically Signed   By: Beckie Salts M.D.   On: 04/02/2015 17:38   Ct Abdomen Pelvis W Contrast  03/18/2015   CLINICAL  DATA:  Nausea and vomiting for 4 days, anxiety, status post hysterectomy CT pelvis 05/27/2014  EXAM: CT ABDOMEN AND PELVIS WITH CONTRAST   TECHNIQUE: Multidetector CT imaging of the abdomen and pelvis was performed using the standard protocol following bolus administration of intravenous contrast.  CONTRAST:  40mL OMNIPAQUE IOHEXOL 300 MG/ML SOLN, OMNIPAQUE IOHEXOL 300 MG/ML SOLN  COMPARISON:  None.  FINDINGS: Sagittal images of the spine shows diffuse osteopenia. Degenerative changes thoracolumbar spine. The lung bases are unremarkable.  The patient is status post cholecystectomy. Mild intrahepatic biliary ductal dilatation. There is CBD dilatation up to 1.1 cm. No focal hepatic mass. The pancreas, spleen and adrenal glands are unremarkable. Kidneys are symmetrical in size and enhancement. No hydronephrosis or hydroureter. Delayed renal images shows bilateral renal symmetrical excretion.  Atherosclerotic calcifications of abdominal aorta and iliac arteries. Moderate stool noted in right colon and proximal transverse colon. No colonic obstruction. The descending colon and sigmoid colon are empty collapsed. Some stool noted within rectum.  There are fluid distended small bowel loops in mid upper abdomen and left lower abdomen and pelvis. Small amount of fluid/stranding noted a adjacent to small bowel loops in left lower quadrant see axial image 57. Distal small bowel is decreased caliber collapsed. Findings are consistent with small bowel obstruction.  Mild distended urinary bladder. The patient is status post hysterectomy. The terminal ileum is small caliber decompressed. Contrast material is noted within stomach. No any contrast material is noted within small bowel or colon.  IMPRESSION: 1. There are fluid distended small bowel loops with multiple air-fluid levels highly suspicious for small bowel obstruction. Distal small bowel is small caliber decompressed. Terminal ileum is small caliber. Small amount of fluid/stranding noted adjacent to small bowel loops in left lower quadrant please see images 49 and 57. 2. Moderate stool noted in right  colon and proximal transverse colon. Descending colon and sigmoid colon are empty collapsed. 3. No pericecal inflammation. 4. No hydronephrosis or hydroureter. 5. Status post cholecystectomy. Mild intrahepatic biliary ductal dilatation. CBD dilatation up to 1.1 cm. 6. No hydronephrosis or hydroureter. 7. Status post hysterectomy. These results were called by telephone at the time of interpretation on 03/18/2015 at 8:59 am to Dr. Gerhard Munch , who verbally acknowledged these results.   Electronically Signed   By: Natasha Mead M.D.   On: 03/18/2015 08:59   US Renal  03/25/2015   CLINICAL DATA:  Acute renal failure.  EXAM: RENAL / URINARY TRACT ULTRASOUND COMPLETE  COMPARISON:  None.  FINDINGS: Right Kidney:  Length: 8.1 cm. Increased echogenicity consistent with chronic medical renal disease. No mass or hydronephrosis visualized.  Left Kidney:  Length: 9.3 cm. Increased echogenicity consistent chronic medical renal disease. No mass or hydronephrosis visualized.  Bladder:  Bladder decompressed by Foley catheter.  IMPRESSION: Bilateral echodense kidneys consistent chronic medical renal disease. No acute abnormality. No hydronephrosis or bladder distention.   Electronically Signed   By: Maisie Fus  Register   On: 03/25/2015 13:27   Dg Chest Port 1 View  03/27/2015   CLINICAL DATA:  Left PICC placement. Nausea and vomiting. Initial encounter.  EXAM: PORTABLE CHEST - 1 VIEW  COMPARISON:  Chest radiograph performed 03/26/2015  FINDINGS: The patient's left PICC is noted ending about the proximal SVC. An enteric tube is noted extending below the diaphragm.  The lungs are hypoexpanded. Bibasilar and right upper lung zone airspace opacities may reflect atelectasis or pneumonia. No pleural effusion or pneumothorax is seen  The cardiomediastinal silhouette is borderline  normal in size. No acute osseous abnormalities are identified.  IMPRESSION: 1. Left PICC noted ending about the proximal SVC. 2. Lungs hypoexpanded. Bibasilar  and right upper lung zone airspace opacities may reflect atelectasis or pneumonia.  These results were called by telephone at the time of interpretation on 03/27/2015 at 7:33 pm to Nursing in the [facility], who verbally acknowledged these results.   Electronically Signed   By: Roanna Raider M.D.   On: 03/27/2015 19:34   Dg Chest Port 1 View  03/26/2015   CLINICAL DATA:  Atelectasis  EXAM: PORTABLE CHEST - 1 VIEW  COMPARISON:  Yesterday  FINDINGS: NG tube and left PICC are stable. Upper normal heart size. Low volumes. Bibasilar atelectasis is not significantly changed. No pneumothorax.  IMPRESSION: Stable bibasilar atelectasis.   Electronically Signed   By: Jolaine Click M.D.   On: 03/26/2015 08:14   Dg Chest Port 1 View  03/25/2015   CLINICAL DATA:  Tachycardia and hypotension; concern for sepsis  EXAM: PORTABLE CHEST - 1 VIEW  COMPARISON:  May 27, 2014  FINDINGS: There is patchy atelectasis in both lung bases. The degree of inspiration is shallow. There is no frank airspace consolidation. Heart is upper normal in size with pulmonary vascularity within normal limits.  Central catheter tip is in the superior vena cava. Nasogastric tube tip and side port are in the distal stomach. No pneumothorax.  IMPRESSION: Bibasilar atelectasis. No pneumothorax. Degree of inspiration shallow.   Electronically Signed   By: Bretta Bang III M.D.   On: 03/25/2015 09:03   Dg Abd Portable 1v  03/24/2015   CLINICAL DATA:  Small-bowel obstruction  EXAM: PORTABLE ABDOMEN - 1 VIEW  COMPARISON:  03/23/2015  FINDINGS: NG tube is been placed with the tip in the gastric antrum  Small bowel dilatation shows mild interval improvement. Colon is decompressed with moderate stool in the right colon. Surgical clips in the gallbladder fossa.  IMPRESSION: Mild improvement in small bowel obstruction. NG tube in the gastric antrum.   Electronically Signed   By: Marlan Palau M.D.   On: 03/24/2015 08:25   Dg Abd Portable  1v  03/23/2015   CLINICAL DATA:  Followup small bowel obstruction.  EXAM: PORTABLE ABDOMEN - 1 VIEW  COMPARISON:  03/22/2015  FINDINGS: Dilated loops small bowel are similar to the prior exam allowing for differences in radiographic technique different degrees of magnification.  There is a small amount of air in a nondistended colon.  IMPRESSION: 1. Persistent high-grade partial small bowel obstruction. No significant change from the previous day's study.   Electronically Signed   By: Amie Portland M.D.   On: 03/23/2015 10:01   Dg Abd Portable 1v  03/22/2015   CLINICAL DATA:  81 year old female with recent small bowel obstruction. Initial encounter.  EXAM: PORTABLE ABDOMEN - 1 VIEW  COMPARISON:  03/21/2015 and earlier, including CT Abdomen and Pelvis 03/18/2015.  FINDINGS: Portable AP supine view at 0517 hrs. Enteric tube has been removed. Recurrent dilated gas-filled small bowel loops in the mid abdomen up to 41 mm diameter. Abundant stool now at the splenic flexure. Decreased distal colon gas in the pelvis. Stable cholecystectomy clips. Stable abdominal and pelvic visceral contours. Grossly stable lung bases. Stable visualized osseous structures.  IMPRESSION: NG tube removed with recurrent small bowel obstruction.   Electronically Signed   By: Odessa Fleming M.D.   On: 03/22/2015 07:42   Dg Abd Portable 1v  03/21/2015   CLINICAL DATA:  Small-bowel obstruction .  EXAM: PORTABLE ABDOMEN - 1 VIEW  COMPARISON:  03/20/2015 .  FINDINGS: NG tube noted with tip in the upper portion stomach. Slight advancement should be considered . Surgical clips right upper quadrant Soft tissue structures are unremarkable. Small-bowel distention has improved. No free air. Stool noted throughout the colon. No acute bony abnormality.  IMPRESSION: 1. NG tube noted with tip in the upper portion of the stomach. Advancement suggested.  2. Interim improvement of small bowel distention. Colonic gas pattern is normal with stool throughout the  colon.  These results will be called to the ordering clinician or representative by the Radiologist Assistant, and communication documented in the PACS or zVision Dashboard.   Electronically Signed   By: Maisie Fus  Register   On: 03/21/2015 07:32   Dg Abd Portable 1v  03/20/2015   CLINICAL DATA:  Followup small bowel obstruction.  EXAM: PORTABLE ABDOMEN - 1 VIEW  COMPARISON:  03/19/2015  FINDINGS: Mild small bowel dilation persists most evident in the left upper quadrant. There has been no substantial change from the prior study. Residual contrast is noted in the bladder, also similar to the prior exam. Nasogastric tube tip projects in the distal stomach.  IMPRESSION: Small bowel dilation persists consistent with a persistent partial small bowel obstruction. No change from the previous day's study.   Electronically Signed   By: Amie Portland M.D.   On: 03/20/2015 07:29   Dg Abd Portable 1v  03/19/2015   CLINICAL DATA:  Small bowel obstruction protocol. Small bowel obstruction.  EXAM: PORTABLE ABDOMEN - 1 VIEW  COMPARISON:  03/18/2015.  FINDINGS: This is a 24 hr film. There is no contrast identified in the cecum. On the prior exam at 2046 hr yesterday, oral contrast was present in the stomach. Excreted contrast is present within the urinary bladder. Dilation of small bowel loops measures 36 mm. Cholecystectomy clips are present in the right upper quadrant.  There is less small bowel contrast than expected which may be due to dilution in the proximal small bowel. Nasogastric tube remains in the stomach with the tip at the antrum.  Large amount of stool is present in the colon.  IMPRESSION: Small bowel obstruction with no contrast identified in the cecum at 24 hr.   Electronically Signed   By: Andreas Newport M.D.   On: 03/19/2015 17:11   Dg Abd Portable 1v-small Bowel Obstruction Protocol-initial, 8 Hr Delay  03/19/2015   CLINICAL DATA:  Small bowel obstruction  EXAM: PORTABLE ABDOMEN - 1 VIEW  COMPARISON:   Radiographs and CT 03/18/2015  FINDINGS: Nasogastric tube extends into the distal stomach. There is contrast in the gastric lumen. There is no significant contrast in the small bowel or colon. Dilated stacked loops of small bowel persist in the mid abdomen, probably unchanged. No free air is evident on this single AP supine portable radiograph.  IMPRESSION: Enteric contrast has not reached the colon. Persistent small bowel dilatation consistent with small-bowel obstruction.   Electronically Signed   By: Ellery Plunk M.D.   On: 03/19/2015 01:48   Dg Abd Portable 1v-small Bowel Protocol-position Verification  03/18/2015   CLINICAL DATA:  Small bowel protocol, nasogastric tube placement  EXAM: PORTABLE ABDOMEN - 1 VIEW  COMPARISON:  Portable exam 1303 hours compared to CT abdomen and pelvis 03/18/2015  FINDINGS: Tip of nasogastric tube projects over distal gastric antrum or duodenal bulb, patient slightly rotated to the RIGHT.  Excreted contrast material within renal collecting systems and distended  urinary bladder.  Air-filled loops of dilated small bowel noted in the mid abdomen suspicious for small bowel obstruction.  Some gas and stool remain present within the colon.  Degenerative disc and facet disease changes thoracolumbar spine.  Osseous demineralization.  IMPRESSION: Tip of nasogastric tube projects over either the distal gastric antrum or the duodenal bulb region.  Dilated small bowel loops question small bowel obstruction.   Electronically Signed   By: Ulyses Southward M.D.   On: 03/18/2015 13:16   Dg Foot 2 Views Right  03/22/2015   CLINICAL DATA:  Dorsal right foot pain for 4 days, no known injury, initial encounter  EXAM: RIGHT FOOT - 2 VIEW  COMPARISON:  None.  FINDINGS: Degenerative changes are noted in the tarsal bones. No acute fracture or dislocation is noted. A small calcaneal spur is noted.  IMPRESSION: Degenerative change without acute abnormality.   Electronically Signed   By: Alcide Clever  M.D.   On: 03/22/2015 14:57   Dg Swallowing Func-speech Pathology  04/01/2015    Objective Swallowing Evaluation:    Patient Details  Name: [name] MRN: [id number] Date of Birth: 1933/11/06  Today's Date: 04/01/2015 Time: SLP Start Time (ACUTE ONLY): 0835-SLP Stop Time (ACUTE ONLY): 0857 SLP Time Calculation (min) (ACUTE ONLY): 22 min  Past Medical History:  Past Medical History  Diagnosis Date  . CAD (coronary artery disease)     s/p stenting of LAD 1999- cath 5-08 EF normal LAD 30-40% restenosis. D1  50% D2 80% LCX & RCA minimal plaque  . HTN (hypertension)   . Hyperlipemia   . Anemia     iron deficiency  . Depression   . DVT (deep venous thrombosis)   . Gout   . Osteoporosis   . Pancreatitis   . GERD (gastroesophageal reflux disease)   . Renal insufficiency     Cr 1.2-1.3  . Obesity   . Diabetes mellitus   . Polyarthritis     DJD/ possible PMR  . Vitamin D deficiency   . B12 deficiency   . Tinnitus   . Anxiety   . Aneurysm, thoracic aortic   . Constipation   . Urinary frequency   . Vertigo   . Chronic back pain    Past Surgical History:  Past Surgical History  Procedure Laterality Date  . Hemorrhoid surgery    . Abdominal hysterectomy    . Cholecystectomy    . Tubal ligation    . Coronary angioplasty with stent placement  1999    LAD stent  . Cardiac catheterization  2008    L main 20%, LAD stent patent, D1 50%, D2 80% (small), RCA 20%, EF 55-60%   . Laparoscopy N/A 03/24/2015    Procedure: LAPAROSCOPY DIAGNOSTIC;  Surgeon: Luretha Murphy, MD;   Location: [facility];  Service: General;  Laterality: N/A;  . Laparoscopic lysis of adhesions N/A 03/24/2015    Procedure: LAPAROSCOPIC LYSIS OF ADHESIONS;  Surgeon: Luretha Murphy,  MD;  Location: [facility];  Service: General;  Laterality: N/A;  . Laparotomy N/A 03/24/2015    Procedure: LAPAROTOMY with decompression of bowel;  Surgeon: Luretha Murphy, MD;  Location: [facility];  Service: General;  Laterality: N/A;   HPI:  Other Pertinent Information: Pt is a 81 year old  female admitted 03/18/15  with SBO secondary to adhesions. She failed conservative therapy and  underwent laparoscopic lysis of adhesions/laparotomy with bowel  decompression on 03/24/15. Her hospital course was complicated by sepsis  secondary to  peritoneal contamination with transfer to the SDU on 03/25/15.  She also developed left-sided facial droop/right arm weakness 03/27/15 with  CT scan showing a 4 mm lacunar infarct in the left lentiform nucleus. MRI  failed to show any abnormality. After removal of NG for suction, MD  ordered MBS prior to initiating diet.   No Data Recorded  Assessment / Plan / Recommendation CHL IP CLINICAL IMPRESSIONS 04/01/2015  Therapy Diagnosis Mild pharyngeal phase dysphagia;Mild oral phase  dysphagia  Clinical Impression Mild oral dysphagia characterized by decreased lingual  formation of bolus, improved with straw sips and improving progressively  during exam. oropharyngeal dysphagia also mild with no penetration or  aspiration. Suspect mild standing secretions intially during exam as  pharyngeal residuals decreased and timing of swallow improved with each  trial. Though swallow was initially delayed, by end of exam pt taking  large consecutive sips of thin with minimal delay and minimal residuals.  Recommend pt initiate a dys 3 (mechanical soft) diet with basic aspiration  precautions of upright posture and occasional second swallow to clear oral  cavity and orophrayngeal residuals. SLP will f/u for tolerance during  acute stay.       CHL IP TREATMENT RECOMMENDATION 04/01/2015  Treatment Recommendations Therapy as outlined in treatment plan below     CHL IP DIET RECOMMENDATION 04/01/2015  SLP Diet Recommendations Dysphagia 3 (Mech soft);Thin  Liquid Administration via (None)  Medication Administration Whole meds with liquid  Compensations Multiple dry swallows after each bite/sip  Postural Changes and/or Swallow Maneuvers (None)     CHL IP OTHER RECOMMENDATIONS 04/01/2015  Recommended  Consults (None)  Oral Care Recommendations Oral care BID  Other Recommendations (None)     CHL IP FOLLOW UP RECOMMENDATIONS 03/29/2015  Follow up Recommendations 24 hour supervision/assistance     CHL IP FREQUENCY AND DURATION 04/01/2015  Speech Therapy Frequency (ACUTE ONLY) min 2x/week  Treatment Duration 2 weeks     Pertinent Vitals/Pain NA    SLP Swallow Goals No flowsheet data found.  No flowsheet data found.    CHL IP REASON FOR REFERRAL 04/01/2015  Reason for Referral Objectively evaluate swallowing function     CHL IP ORAL PHASE 04/01/2015  Lips (None)  Tongue (None)  Mucous membranes (None)  Nutritional status (None)  Other (None)  Oxygen therapy (None)  Oral Phase Impaired  Oral - Pudding Teaspoon (None)  Oral - Pudding Cup (None)  Oral - Honey Teaspoon (None)  Oral - Honey Cup (None)  Oral - Honey Syringe (None)  Oral - Nectar Teaspoon (None)  Oral - Nectar Cup (None)  Oral - Nectar Straw (None)  Oral - Nectar Syringe (None)  Oral - Ice Chips (None)  Oral - Thin Teaspoon (None)  Oral - Thin Cup (None)  Oral - Thin Straw (None)  Oral - Thin Syringe (None)  Oral - Puree (None)  Oral - Mechanical Soft (None)  Oral - Regular (None)  Oral - Multi-consistency (None)  Oral - Pill (None)  Oral Phase - Comment (None)      CHL IP PHARYNGEAL PHASE 04/01/2015  Pharyngeal Phase Impaired  Pharyngeal - Pudding Teaspoon (None)  Penetration/Aspiration details (pudding teaspoon) (None)  Pharyngeal - Pudding Cup (None)  Penetration/Aspiration details (pudding cup) (None)  Pharyngeal - Honey Teaspoon (None)  Penetration/Aspiration details (honey teaspoon) (None)  Pharyngeal - Honey Cup (None)  Penetration/Aspiration details (honey cup) (None)  Pharyngeal - Honey Syringe (None)  Penetration/Aspiration details (honey syringe) (None)  Pharyngeal - Nectar Teaspoon (None)  Penetration/Aspiration  details (nectar teaspoon) (None)  Pharyngeal - Nectar Cup (None)  Penetration/Aspiration details (nectar cup) (None)  Pharyngeal - Nectar  Straw (None)  Penetration/Aspiration details (nectar straw) (None)  Pharyngeal - Nectar Syringe (None)  Penetration/Aspiration details (nectar syringe) (None)  Pharyngeal - Ice Chips (None)  Penetration/Aspiration details (ice chips) (None)  Pharyngeal - Thin Teaspoon (None)  Penetration/Aspiration details (thin teaspoon) (None)  Pharyngeal - Thin Cup (None)  Penetration/Aspiration details (thin cup) (None)  Pharyngeal - Thin Straw (None)  Penetration/Aspiration details (thin straw) (None)  Pharyngeal - Thin Syringe (None)  Penetration/Aspiration details (thin syringe') (None)  Pharyngeal - Puree (None)  Penetration/Aspiration details (puree) (None)  Pharyngeal - Mechanical Soft (None)  Penetration/Aspiration details (mechanical soft) (None)  Pharyngeal - Regular (None)  Penetration/Aspiration details (regular) (None)  Pharyngeal - Multi-consistency (None)  Penetration/Aspiration details (multi-consistency) (None)  Pharyngeal - Pill (None)  Penetration/Aspiration details (pill) (None)  Pharyngeal Comment (None)      No flowsheet data found.  No flowsheet data found.        Harlon Ditty, [place] CCC-SLP [phone]  Claudine Mouton 04/01/2015, 10:31 AM      Medical Consultants:    Surgery - Dr. Glenna Fellows   Cardiology   PCCM  Neurology - Dr. Thana Farr   Anti-Infectives:    Mefoxin 03/25/2015 --> 03/26/2015  Rocephin 03/26/2015 -->   Flagyl 03/26/2015 -->  Subjective:   [name] is still having a lot of abdominal pain.  No nausea or vomiting.  Weak.  Complains of sacral and rectal pain.    Objective:    Filed Vitals:   04/03/15 1730 04/03/15 1740 04/03/15 2107 04/04/15 0447  BP: 158/88 164/74 159/75 154/66  Pulse: 99 97 105 109  Temp:   99 F (37.2 C) 99.2 F (37.3 C)  TempSrc:   Oral Oral  Resp: 16 16 18 18   Height:      Weight:      SpO2: 98% 97% 100% 100%    Intake/Output Summary (Last 24 hours) at 04/04/15 0758 Last data filed at 04/04/15 0656   Gross per 24 hour  Intake 1233.99 ml  Output   3190 ml  Net -1956.01 ml    Exam: Gen:  NAD Cardiovascular:  RRR, No M/R/G Respiratory:  Lungs CTAB Gastrointestinal:  Abdomen softly distended, tender, + BS Extremities:  1+ edema   Data Reviewed:    Labs: Basic Metabolic Panel:  Recent Labs Lab 03/29/15 0528 03/31/15 0350 04/01/15 0450 04/02/15 0537  04/03/15 0545 04/04/15 0650  NA 133* 136 135 135  --  132* 132*  K 3.7 4.8 5.1 5.8*  < > 4.9 4.7  CL 104 102 104 103  --  100* 103  CO2 23 25 24 25   --  24 21*  GLUCOSE 149* 176* 185* 163*  --  195* 174*  BUN 51* 48* 53* 48*  --  45* 46*  CREATININE 1.08* 0.98 0.89 0.80  --  0.75 0.83  CALCIUM 7.7* 8.2* 8.0* 8.5*  --  8.3* 8.2*  MG 1.9 1.7 1.8 1.7  --   --  1.5*  PHOS  --  4.5 4.7* 4.6  --   --  3.8  < > = values in this interval not displayed. GFR Estimated Creatinine Clearance: 52 mL/min (by C-G formula based on Cr of 0.83). Liver Function Tests:  Recent Labs Lab 03/29/15 0528 03/31/15 0350 04/04/15 0650  AST 33 26 45*  ALT 18 14 26   ALKPHOS 98 98 139*  BILITOT 0.4 0.4 0.5  PROT 5.6* 5.9* 6.8  ALBUMIN 1.7* 1.8* 1.9*   Coagulation profile  Recent Labs Lab 04/03/15 1140  INR 1.22    CBC:  Recent Labs Lab 04/01/15 0450 04/02/15 0537 04/03/15 0545 04/04/15 0650  WBC 14.8* 16.9* 16.9* 16.7*  NEUTROABS  --   --   --  15.3*  HGB 6.4* 9.1* 9.0* 8.8*  HCT 19.5* 28.7* 29.3* 28.3*  MCV 89.9 82.0 81.6 83.2  PLT 297 364 370 465*   Cardiac Enzymes: No results for input(s): CKTOTAL, CKMB, CKMBINDEX, TROPONINI in the last 168 hours. CBG:  Recent Labs Lab 04/03/15 1149 04/03/15 1624 04/03/15 2000 04/03/15 2343 04/04/15 0353  GLUCAP 168* 175* 124* 156* 136*   Sepsis Labs:  Recent Labs Lab 04/01/15 0450 04/02/15 0537 04/03/15 0545 04/04/15 0650  WBC 14.8* 16.9* 16.9* 16.7*   Microbiology Recent Results (from the past 240 hour(s))  Culture, blood (x 2)     Status: None   Collection Time:  03/25/15  9:30 AM  Result Value Ref Range Status   Specimen Description BLOOD RIGHT HAND  Final   Special Requests BOTTLES DRAWN AEROBIC ONLY 5CC  Final   Culture   Final    NO GROWTH 5 DAYS Performed at [facility]    Report Status 03/31/2015 FINAL  Final  Culture, blood (x 2)     Status: None   Collection Time: 03/25/15  9:35 AM  Result Value Ref Range Status   Specimen Description BLOOD RIGHT ARM  Final   Special Requests BOTTLES DRAWN AEROBIC AND ANAEROBIC 6CC  Final   Culture   Final    NO GROWTH 5 DAYS Performed at [facility]    Report Status 03/31/2015 FINAL  Final  Culture, Urine     Status: None   Collection Time: 03/25/15 11:57 AM  Result Value Ref Range Status   Specimen Description URINE, CATHETERIZED  Final   Special Requests NONE  Final   Colony Count NO GROWTH Performed at [facility]   Final   Culture NO GROWTH Performed at [facility]   Final   Report Status 03/26/2015 FINAL  Final     Medications:   . alteplase  2 mg Intracatheter Once  . antiseptic oral rinse  7 mL Mouth Rinse q12n4p  . aspirin  300 mg Rectal Daily   Or  . aspirin  325 mg Oral Daily  . bisacodyl  10 mg Rectal Daily  . chlorhexidine  15 mL Mouth Rinse BID  . feeding supplement (ENSURE ENLIVE)  237 mL Oral BID BM  . insulin aspart  0-15 Units Subcutaneous 6 times per day  . lip balm  1 application Topical BID  . metoprolol  2.5 mg Intravenous 4 times per day   Continuous Infusions: . sodium chloride 10 mL/hr (03/29/15 0800)  . TPN (CLINIMIX) Adult without lytes 70 mL/hr at 04/03/15 1751   And  . fat emulsion 240 mL (04/03/15 1751)    Time spent: 35 minutes with > 50% of time discussing current diagnostic test results, clinical impression and plan of care with the patient's son.   LOS: 17 days   RAMA,CHRISTINA  [facility] Pager [phone]. If unable to reach me by pager, please call my cell phone at [phone].  *Please refer to  [email or web address], password [email or web address] to get updated schedule on who will round on this patient, as hospitalists switch teams weekly. If 7PM-7AM, please contact night-coverage at [email or web address], password [email or web address]  for any overnight needs.  04/04/2015, 7:58 AM

## 2015-04-04 NOTE — Progress Notes (Signed)
Physical Therapy Treatment Patient Details Name: [name] MRN: [id number] DOB: 01/31/1933 Today's Date: 04/04/2015    History of Present Illness 81 year old female with history of hypertension, coronary artery disease, hyperlipidemia, diabetes and pt s/p lap converted to open LOA for SBO on 5/12, complicated by Left sided facial droop / right arm weakness / acute CVA in left lentiform nucleus however neurologist note states reviewed MRI and no acute changes seen    PT Comments    Assisted pt with BLE ROM exercises, pt c/o pain in B knees with movement, RN notified. Pt reported need to use bedpan. +2 total assist to roll for placement of bedpan.   Follow Up Recommendations  SNF;Supervision/Assistance - 24 hour     Equipment Recommendations  Rolling walker with 5" wheels    Recommendations for Other Services       Precautions / Restrictions Precautions Precautions: Fall Precaution Comments: abdominal drain Restrictions Weight Bearing Restrictions: No    Mobility  Bed Mobility Overal bed mobility: Needs Assistance Bed Mobility: Rolling;Sidelying to Sit Rolling: Total assist;+2 for physical assistance         General bed mobility comments: Pt able to assist with rolling by holding onto rail. pt 20%. Activity tolerance limited by B knee pain. RN notified. Pt rolled L and R with +2 assist for placement of bedpan.   Transfers                 General transfer comment: unable to safely attempt with +1 assist   Ambulation/Gait                 Stairs            Wheelchair Mobility    Modified Rankin (Stroke Patients Only)       Balance                                    Cognition Arousal/Alertness: Lethargic Behavior During Therapy: WFL for tasks assessed/performed Overall Cognitive Status: Within Functional Limits for tasks assessed                      Exercises General Exercises - Lower Extremity Ankle  Circles/Pumps: Both;10 reps;Supine;AAROM Heel Slides: AAROM;10 reps;Supine;Both Hip ABduction/ADduction: AAROM;Both;10 reps;Supine Other Exercises Other Exercises: AAROM x 8 Rt shoulder flexion.   Pt too fatigued to perform Lt. UE     General Comments        Pertinent Vitals/Pain Faces Pain Scale: Hurts whole lot Pain Location: B knees with movement Pain Descriptors / Indicators: Sore Pain Intervention(s): Patient requesting pain meds-RN notified    Home Living                      Prior Function            PT Goals (current goals can now be found in the care plan section) Acute Rehab PT Goals Patient Stated Goal: none stated PT Goal Formulation: With patient Time For Goal Achievement: 04/12/15 Potential to Achieve Goals: Fair Progress towards PT goals: Not progressing toward goals - comment (pain limiting activity tolerance)    Frequency  Min 3X/week    PT Plan Current plan remains appropriate    Co-evaluation             End of Session   Activity Tolerance: Patient limited by pain;Patient limited by fatigue Patient left: in bed;with  call bell/phone within reach     Time: 1314-1334 PT Time Calculation (min) (ACUTE ONLY): 20 min  Charges:  $Therapeutic Activity: 8-22 mins                    G Codes:      Tamala Ser 04/04/2015, 1:42 PM [phone]

## 2015-04-04 NOTE — Progress Notes (Signed)
Nutrition Follow-up  DOCUMENTATION CODES:  Non-severe (moderate) malnutrition in context of chronic illness  INTERVENTION: - Continue TPN per pharmacy - If within POC, recommend appetite stimulant to assess if this aids in increased PO intakes with TPN wean - Continue Dysphagia 3 diet with Ensure Enlive BID - RD to continue to monitor for needs  NUTRITION DIAGNOSIS:  Malnutrition related to chronic illness as evidenced by moderate depletion of body fat, severe depletion of muscle mass.  GOAL:  Patient will meet greater than or equal to 90% of their needs  MONITOR:  Diet advancement, Labs, Weight trends, Skin, I & O's  ASSESSMENT: 81 year old female with history of hypertension, coronary artery disease, hyperlipidemia, iron deficiency anemia, depression, osteoporosis, mild chronic kidney disease presented to the ED with ongoing nausea for 4 days and diffuse crampy lower abdominal pain since one day prior to admission. Associated poor by mouth intake and generalized weakness. Patient was found to have small bowel obstruction.  Pt status post ex lap 5/12. Attempting to wean TPN starting today but pt not taking much PO; 0-10% of meals over the past 3 days. Spoke with pharmacist after rounds this AM. He indicates plan to decrease TPN to Clinimix 5/15 @ 50 mL/hr with continued 20% lipids @ 10 mL/hr. Dependent on electrolyte balance tomorrow Clinimix E 5/15 may be ordered at that time. Pharmacist states that surgeon concerned about poor appetite; no appetite stimulants ordered at this time.   Pt sleeping at time of visit. She is currently receiving Clinimix 5/15 @ 70 mL/hr with 20% lipids @ 10 mL/hr which is providing 84 grams protein, 1672 kcal which is meeting needs. Clinimix 5/15 @ 50 mL/hr with 20% lipids @ 10 mL/hr will provide 60 grams protein, 1332 kcal which will not meet needs.  Medications reviewed. Labs: CBGs: 124-182 mg/dL, Na: 353 mmol/L, BUN elevated.  Height:  Ht Readings  from Last 1 Encounters:  03/29/15 5\' 4"  (1.626 m)    Weight:  Wt Readings from Last 1 Encounters:  03/27/15 149 lb 4 oz (67.7 kg)    Ideal Body Weight:  54.5 kg  Wt Readings from Last 10 Encounters:  03/27/15 149 lb 4 oz (67.7 kg)  12/31/14 145 lb (65.772 kg)  12/07/14 147 lb 8 oz (66.906 kg)  09/23/14 150 lb (68.04 kg)  08/19/14 150 lb (68.04 kg)  07/22/14 153 lb 12 oz (69.741 kg)  06/24/14 156 lb (70.761 kg)  05/26/14 162 lb (73.483 kg)  04/12/14 164 lb (74.39 kg)  03/14/14 172 lb (78.019 kg)    BMI:  Body mass index is 25.61 kg/(m^2).  Estimated Nutritional Needs:  Kcal:  1500-1700  Protein:  75-85g  Fluid:  1.5L/day  Skin:  Wound (see comment) (surgical incision and puncture wound on abdomen)  Diet Order:  TPN (CLINIMIX) Adult without lytes DIET DYS 3 Room service appropriate?: Yes; Fluid consistency:: Thin TPN (CLINIMIX) Adult without lytes  EDUCATION NEEDS:  No education needs identified at this time   Intake/Output Summary (Last 24 hours) at 04/04/15 1250 Last data filed at 04/04/15 1138  Gross per 24 hour  Intake 1303.99 ml  Output   4090 ml  Net -2786.01 ml    Last BM:  5/20   6/20, RD, LDN Inpatient Clinical Dietitian Pager # [phone] After hours/weekend pager # [phone]

## 2015-04-05 LAB — BASIC METABOLIC PANEL
Anion gap: 7 (ref 5–15)
BUN: 46 mg/dL — ABNORMAL HIGH (ref 6–20)
CO2: 22 mmol/L (ref 22–32)
Calcium: 8.3 mg/dL — ABNORMAL LOW (ref 8.9–10.3)
Chloride: 103 mmol/L (ref 101–111)
Creatinine, Ser: 0.83 mg/dL (ref 0.44–1.00)
GFR calc Af Amer: >60 mL/min (ref >60)
Glucose, Bld: 113 mg/dL — ABNORMAL HIGH (ref 65–99)
Potassium: 4.5 mmol/L (ref 3.5–5.1)
SODIUM: 132 mmol/L — AB (ref 135–145)

## 2015-04-05 LAB — MAGNESIUM: MAGNESIUM: 2.1 mg/dL (ref 1.7–2.4)

## 2015-04-05 LAB — GLUCOSE, CAPILLARY
GLUCOSE-CAPILLARY: 110 mg/dL — AB (ref 65–99)
Glucose-Capillary: 156 mg/dL — ABNORMAL HIGH (ref 65–99)
Glucose-Capillary: 103 mg/dL — ABNORMAL HIGH (ref 65–99)
Glucose-Capillary: 182 mg/dL — ABNORMAL HIGH (ref 65–99)
Glucose-Capillary: 149 mg/dL — ABNORMAL HIGH (ref 65–99)
Glucose-Capillary: 151 mg/dL — ABNORMAL HIGH (ref 65–99)

## 2015-04-05 LAB — PHOSPHORUS: Phosphorus: 4.4 mg/dL (ref 2.5–4.6)

## 2015-04-05 MED ORDER — FAT EMULSION 20 % IV EMUL
240.0000 mL | INTRAVENOUS | Status: AC
  Administered 2015-04-05: 240 mL via INTRAVENOUS
  Filled 2015-04-05: qty 250

## 2015-04-05 MED ORDER — TRACE MINERALS CR-CU-MN-SE-ZN 10-1000-500-60 MCG/ML IV SOLN
INTRAVENOUS | Status: AC
  Administered 2015-04-05: 18:00:00 via INTRAVENOUS
  Filled 2015-04-05: qty 960

## 2015-04-05 NOTE — Progress Notes (Signed)
NUTRITION NOTE  Spoke with pharmacist this AM. Plan to wean TPN to Clinimix 5/15 @ 40 mL/hr with 205 lipids @ 10 mL/hr which will provide 48 grams protein, 1162 kcal. Also talked with Aundra Millet, PA for surgery about POC concerning nutrition/nutrition support. Her hope is to wean TPN off and that pt will be able to meet needs PO. A brief discussion was had with the family by surgical team about PEG, should it be needed, and PA reports that daughter seemed open to the idea but this topic was not explored in depth at this time.  Per RN notes, pt has developed thrush.   Calorie Count Note  48 hour calorie count ordered.  Diet: Dysphagia 3 with thin liquids Supplements: Ensure Enlive BID, will also order Magic Cup TID  Nutrition Dx: malnutrition related to chronic illness as evidenced by moderate depletion of body fat, severe depletion of muscle mass  Goal: pt to meet >/= 90% estimated needs  Intervention: Continue to wean TPN per pharmacy, provide supplements and meals, monitor for need for PEG   Trenton Gammon, RD, LDN Inpatient Clinical Dietitian Pager # [phone] After hours/weekend pager # [phone]

## 2015-04-05 NOTE — Progress Notes (Signed)
PARENTERAL NUTRITION CONSULT NOTE - follow up  Pharmacy Consult for TPN Indication: bowel obstruction  Allergies  Allergen Reactions  . Amlodipine Besylate     REACTION: dizzy  . Aspirin   . Atenolol     REACTION: fatigue  . Benazepril     cough  . Benicar [Olmesartan Medoxomil]     HA  . Cozaar     nausea  . Hydrochlorothiazide W-Triamterene     REACTION: dizzy  . Hydrocodone     REACTION: HA  . Hydroxyzine Pamoate   . Iodine   . Lisinopril     REACTION: tired, cough  . Penicillins Itching    tolerates cephalosporins OK  . Pravastatin     myalgias  . Prednisolone     My stomach hurts  . Tramadol Hcl     REACTION: HA    Patient Measurements: Height: 5' 4" (162.6 cm) Weight: 149 lb 4 oz (67.7 kg) IBW/kg (Calculated) : 54.7 Adjusted Body Weight: 57.5kg   Vital Signs: Temp: 98.2 F (36.8 C) (05/24 0442) Temp Source: Oral (05/24 0442) BP: 153/68 mmHg (05/24 0442) Pulse Rate: 95 (05/24 0442) Intake/Output from previous day: 05/23 0701 - 05/24 0700 In: 1369.7 [P.O.:240; TPN:1099.7] Out: 2914 [Urine:2850; Drains:64] Intake/Output from this shift:    Labs:  Recent Labs  04/03/15 0545 04/03/15 1140 04/04/15 0650  WBC 16.9*  --  16.7*  HGB 9.0*  --  8.8*  HCT 29.3*  --  28.3*  PLT 370  --  465*  INR  --  1.22  --      Recent Labs  04/02/15 1201 04/03/15 0545 04/04/15 0650  NA  --  132* 132*  K 5.7* 4.9 4.7  CL  --  100* 103  CO2  --  24 21*  GLUCOSE  --  195* 174*  BUN  --  45* 46*  CREATININE  --  0.75 0.83  CALCIUM  --  8.3* 8.2*  MG  --   --  1.5*  PHOS  --   --  3.8  PROT  --   --  6.8  ALBUMIN  --   --  1.9*  AST  --   --  45*  ALT  --   --  26  ALKPHOS  --   --  139*  BILITOT  --   --  0.5  PREALBUMIN  --   --  13.7*  TRIG  --   --  85   Estimated Creatinine Clearance: 52 mL/min (by C-G formula based on Cr of 0.83).    Recent Labs  04/05/15 0105 04/05/15 0441 04/05/15 0737  GLUCAP 110* 156* 103*    Insulin  Requirements: 13 units Novolog on 5/23  Current Nutrition:  Dysphagia 3 diet (start 5/20) Ensure BID ordered on 5/20 (taking one can most days)  IVF: NS @ 10 ml/hr  Central access: PICC 5/11 TPN start date: 5/11  ASSESSMENT  HPI: 81-year-old female with history of hypertension, coronary artery disease, hyperlipidemia, iron deficiency anemia, depression, osteoporosis, mild chronic kidney disease presented to the ED with ongoing nausea for 4 days and diffuse crampy lower abdominal pain since one day prior to admission. Associated poor by mouth intake and generalized weakness. Patient was found to have small bowel obstruction.  Pharmacy consulted to start TPN.  Significant events:  5/12: laparoscopic lysis of adhesions with laparotomy and decompression of bowel 5/13: new AKI 5/15: Continue NG tube and NPO d/t bilious output pending BM.   5/19: Remove NGT 5/20 Completed swallow eval - starting dysphagia diet 5/21: TPN changed to electrolytes free d/t high K+; given kayexalate.  Ate about 10% of lunch 5/22: IR drainage of 2 abd abscesses, drains placed 5/23: Weak but with little appetite, no n/v but with post-op pain  Today:   Glucose (goal <150) - CBGs mostly in 130-180 range  Electrolytes - wnl except for Mg borderline low; CoCa ~9.8  Renal -  SCr improved to wnl  WBC elevated; hgb stable at 9  LFTs - Alk phos bumped; slightly elevated.  Others wnl (5/23)  TGs - wnl (5/23)  Prealbumin: greatly improved 2.7 > 13.7 (5/23)  NUTRITIONAL GOALS                                                                                             RD recs (5/18): 75-85 g/day protein, 1500-1700 Kcal/day. Clinimix E 5/15 at a goal rate of 70ml/hr + 20% fat emulsion at 10ml/hr to provide: 84 g/day protein, 1672 Kcal/day.  PLAN                                                                                                                          1) ) At 1800 today:  Reduce Clinimix 5/15 (without lytes) to 40 ml/hr.  Surgery is hoping that gradual weaning will stimulate appetite.  Will leave as plain formula for now as lytes look stable  Continue 20% fat emulsion at 10ml/hr.   TPN to contain standard multivitamins and trace elements.  Maintain IVF at KVO  Continue mod SSI q4h  TPN lab panels on Mondays & Thursdays.  BMP tomorrow AM  F/u LFTs  F/u weaning TPN when pt is tolerating PO intake.    , PharmD Pager: [phone] 04/05/2015, 8:06 AM  

## 2015-04-05 NOTE — Progress Notes (Signed)
Physical Therapy Treatment Patient Details Name: [name] MRN: [id number] DOB: April 16, 1933 Today's Date: 04/05/2015    History of Present Illness 81 year old female with history of hypertension, coronary artery disease, hyperlipidemia, diabetes and pt s/p lap converted to open LOA for SBO on 5/12, complicated by Left sided facial droop / right arm weakness / acute CVA in left lentiform nucleus however neurologist note states reviewed MRI and no acute changes seen    PT Comments    Pt very weak and requiring increased assist for bed mobility and lateral/scoot transfer.  Pt fatigues quickly.  Pt assisted OOB to recliner with lateral/scoot transfer +2 assist.   Recommend nursing use lift for OOB.  Continue to recommend SNF upon d/c.   Follow Up Recommendations  SNF;Supervision/Assistance - 24 hour     Equipment Recommendations  Hospital bed;Wheelchair (measurements PT);Wheelchair cushion (measurements PT)    Recommendations for Other Services       Precautions / Restrictions Precautions Precautions: Fall Precaution Comments: abdominal drain Restrictions Weight Bearing Restrictions: No    Mobility  Bed Mobility Overal bed mobility: Needs Assistance   Rolling: Total assist;+2 for physical assistance Sidelying to sit: Total assist;+2 for physical assistance       General bed mobility comments: assist to bend LLE and utilized pad to roll onto R side.  assist required for upper and lower body, pt intiating however too weak and reporting abdominal pain with movement  Transfers Overall transfer level: Needs assistance Equipment used: None Transfers: Lateral/Scoot Transfers          Lateral/Scoot Transfers: Max assist;+2 physical assistance General transfer comment: pt fearful; able to lean forward a little and held to OT while PT assist with shifting hips, positioned in recliner using bed pad  Ambulation/Gait                 Stairs             Wheelchair Mobility    Modified Rankin (Stroke Patients Only)       Balance Overall balance assessment: Needs assistance Sitting-balance support: Feet supported;Bilateral upper extremity supported Sitting balance-Leahy Scale: Poor Sitting balance - Comments: pt requiring assist for trunk min-mod assist with challenges and fatigue, able to sit upright and hold for about 30 secs at a time Postural control: Right lateral lean                          Cognition Arousal/Alertness: Awake/alert Behavior During Therapy: WFL for tasks assessed/performed Overall Cognitive Status: No family/caregiver present to determine baseline cognitive functioning                      Exercises Other Exercises Other Exercises: AA/AROM to LUE, x 5 reps each. Pt able to raise bil UEs to 90 but recruits biceps to lift LUE    General Comments        Pertinent Vitals/Pain Pain Assessment: Faces Faces Pain Scale: Hurts whole lot Pain Location: L ribs, L knee Pain Descriptors / Indicators: Crying;Grimacing;Tender Pain Intervention(s): Limited activity within patient's tolerance;Monitored during session;Premedicated before session;Repositioned   Pt remained on O2 [place] throughout session    Home Living                      Prior Function            PT Goals (current goals can now be found in the care plan section) Progress towards PT  goals: Progressing toward goals    Frequency  Min 3X/week    PT Plan Current plan remains appropriate    Co-evaluation PT/OT/SLP Co-Evaluation/Treatment: Yes Reason for Co-Treatment: Complexity of the patient's impairments (multi-system involvement) PT goals addressed during session: Mobility/safety with mobility OT goals addressed during session: ADL's and self-care     End of Session Equipment Utilized During Treatment: Oxygen Activity Tolerance: Patient limited by pain;Patient limited by fatigue Patient left: with call  bell/phone within reach;in chair;with chair alarm set     Time: 9528-4132 PT Time Calculation (min) (ACUTE ONLY): 25 min  Charges:  $Therapeutic Activity: 8-22 mins                    G Codes:      Yashira Offenberger,KATHrine E 04/18/2015, 3:24 PM Zenovia Jarred, PT, DPT 2015/04/18 Pager: [phone]

## 2015-04-05 NOTE — Progress Notes (Signed)
CSW continues to follow for discharge planning, met with patient's daughter Eileen Stanford at bedside re: SNF planning. Patient is being weaned from TPN - if successful without TPN, patient would be able to go to [facility] where her husband is for short-term rehab. CSW explained to daughter that if patient is needing to be discharged on TPN - the only SNF that can take TPN and is in-network is [facility]. Patient's daughter is hopeful that patient can be weaned successfully from TPN.   CSW will continue to follow.    Raynaldo Opitz, [facility] Clinical Social Worker cell #: [phone]

## 2015-04-05 NOTE — Progress Notes (Signed)
Occupational Therapy Treatment Patient Details Name: [name] MRN: [id number] DOB: 06-Sep-1933 Today's Date: 04/05/2015    History of present illness 81 year old female with history of hypertension, coronary artery disease, hyperlipidemia, diabetes and pt s/p lap converted to open LOA for SBO on 5/12, complicated by Left sided facial droop / right arm weakness / acute CVA in left lentiform nucleus however neurologist note states reviewed MRI and no acute changes seen   OT comments  Pt with pain but able to work with therapy today.  Pt felt better after she was sitting up in chair  Follow Up Recommendations  SNF    Equipment Recommendations  None recommended by OT    Recommendations for Other Services      Precautions / Restrictions Precautions Precautions: Fall Precaution Comments: abdominal drain Restrictions Weight Bearing Restrictions: No       Mobility Bed Mobility     Rolling: Total assist;+2 for physical assistance Sidelying to sit: Total assist       General bed mobility comments: assist to bend LLE and utilized pad to roll onto R side.  Assisted legs and trunk  Transfers     Transfers: Lateral/Scoot Transfers          Lateral/Scoot Transfers: Max assist;+2 physical assistance General transfer comment: pt fearful; able to lean forward a little and held to therapist; second person assisted with shifting hips    Balance Overall balance assessment: Needs assistance Sitting-balance support: Feet supported;Bilateral upper extremity supported Sitting balance-Leahy Scale: Poor (to fair) Sitting balance - Comments: sitting balance improved with time and cues.  initially leaning to R.                           ADL       Grooming: Wash/dry face;Set up;Supervision/safety;Sitting                   Toilet Transfer: Maximal assistance;+2 for physical assistance (lateral scoot)             General ADL Comments: assisted to EOB with  total A x 2.  Performed grooming tasks and drank some water.  Pt is moving UEs better--performed A/AAROM to 90 degrees      Vision                     Perception     Praxis      Cognition   Behavior During Therapy: [facility] for tasks assessed/performed Overall Cognitive Status: Within Functional Limits for tasks assessed                       Extremity/Trunk Assessment               Exercises Other Exercises Other Exercises: AA/AROM to LUE, x 5 reps each. Pt able to raise bil UEs to 90 but recruits biceps to lift LUE   Shoulder Instructions       General Comments      Pertinent Vitals/ Pain       Pain Assessment: Faces Faces Pain Scale: Hurts whole lot Pain Location: L ribs Pain Descriptors / Indicators: Aching;Crying Pain Intervention(s): Limited activity within patient's tolerance;Monitored during session;Premedicated before session;Repositioned  Home Living  Prior Functioning/Environment              Frequency Min 2X/week     Progress Toward Goals  OT Goals(current goals can now be found in the care plan section)  Progress towards OT goals: Progressing toward goals     Plan Discharge plan remains appropriate    Co-evaluation    PT/OT/SLP Co-Evaluation/Treatment: Yes   PT goals addressed during session: Mobility/safety with mobility OT goals addressed during session: ADL's and self-care      End of Session     Activity Tolerance Patient tolerated treatment well   Patient Left in chair;with call bell/phone within reach;with chair alarm set (maxi move pad under pt)   Nurse Communication          Time: 9628-3662 OT Time Calculation (min): 39 min  Charges: OT General Charges $OT Visit: 1 Procedure OT Treatments $Self Care/Home Management : 8-22 mins $Therapeutic Exercise: 8-22 mins  Lyndsi Altic 04/05/2015, 2:27 PM  Marica Otter,  OTR/L [phone] 04/05/2015

## 2015-04-05 NOTE — Progress Notes (Signed)
Patient ID: [name], female   DOB: 1933/09/14, 81 y.o.   MRN: [id number]    Referring Physician(s): CCS  Subjective:  Pt moaning, c/o mid abd discomfort, left lateral chest /"rib" pain near abd drains; awaiting session with PT; no N/V; diminished appetite  Allergies: Amlodipine besylate; Aspirin; Atenolol; Benazepril; Benicar; Cozaar; Hydrochlorothiazide w-triamterene; Hydrocodone; Hydroxyzine pamoate; Iodine; Lisinopril; Penicillins; Pravastatin; Prednisolone; and Tramadol hcl  Medications: Prior to Admission medications   Medication Sig Start Date End Date Taking? Authorizing Provider  acetaminophen (TYLENOL) 650 MG CR tablet Take 1,300 mg by mouth every 8 (eight) hours as needed for pain.    Yes Historical Provider, MD  aspirin 325 MG tablet Take 325 mg by mouth daily.   Yes Historical Provider, MD  cholecalciferol (VITAMIN D) 1000 UNITS tablet Take 2 tablets (2,000 Units total) by mouth daily. 04/17/13  Yes Aleksei Plotnikov V, MD  Cyanocobalamin (VITAMIN B-12) 1000 MCG SUBL Place 1 tablet (1,000 mcg total) under the tongue daily. 12/03/13  Yes Aleksei Plotnikov V, MD  furosemide (LASIX) 20 MG tablet Take 1 tablet (20 mg total) by mouth daily as needed for edema. Patient taking differently: Take 20 mg by mouth daily.  03/10/15 03/09/16 Yes Aleksei Plotnikov V, MD  metFORMIN (GLUCOPHAGE) 500 MG tablet TAKE 1 TABLET TWICE DAILY  WITH  A  MEAL 08/23/14  Yes Aleksei Plotnikov V, MD  OVER THE COUNTER MEDICATION Apply 1 application topically 2 (two) times daily as needed (dryness). Diabetic Lotion.   Yes Historical Provider, MD  oxybutynin (DITROPAN) 5 MG tablet Take 1 tablet (5 mg total) by mouth 2 (two) times daily. 11/25/14  Yes Aleksei Plotnikov V, MD  Polyethyl Glycol-Propyl Glycol (SYSTANE OP) Apply 1-2 drops to eye daily as needed (dry eyes).   Yes Historical Provider, MD  LORazepam (ATIVAN) 1 MG tablet Take 0.5 tablets (0.5 mg total) by mouth every 8 (eight) hours as needed for  anxiety. Patient not taking: Reported on 03/18/2015 05/07/13   Bethann Berkshire, MD     Vital Signs: BP 151/79 mmHg  Pulse 95  Temp(Src) 98.2 F (36.8 C) (Oral)  Resp 18  Ht 5\' 4"  (1.626 m)  Wt 149 lb 4 oz (67.7 kg)  BMI 25.61 kg/m2  SpO2 100%  Physical Exam pt awake/alert; left lat/upper abd drains intact, insertion sites ok, mild- mod tender, outputs 20-40 cc's serous fluid; blood/urine cx's neg, abd fluid cx's neg to date  Imaging: Ct Abdomen Pelvis W Contrast  04/02/2015   CLINICAL DATA:  Nonspecific abdominal pain. Status post recent laparotomy for small bowel obstruction.  EXAM: CT ABDOMEN AND PELVIS WITH CONTRAST  TECHNIQUE: Multidetector CT imaging of the abdomen and pelvis was performed using the standard protocol following bolus administration of intravenous contrast.  CONTRAST:  34mL OMNIPAQUE IOHEXOL 300 MG/ML SOLN, 45m OMNIPAQUE IOHEXOL 300 MG/ML SOLN  COMPARISON:  Renal ultrasound dated 03/25/2015 and abdomen and pelvis CT dated 03/18/2015.  FINDINGS: Cholecystectomy clips. Interval midline surgical scar in the skin clips at the level of the pelvis. Loculated fluid with peripheral rim enhancement beneath the left hemidiaphragm and partially surrounding the spleen. This measures 7.7 x 5.3 cm on axial image 16. This measures 7.7 cm in length on coronal image number 92.  There is an additional fluid collection in the lateral aspect of the left mid abdomen, measuring 8.3 x 4.2 cm on axial image number 31. This also has peripheral rim enhancement. This measures 9.8 cm in length on coronal image number 64.  There are  dilated small bowel loops in the lower abdomen and upper pelvis. Above the urinary bladder, one of these loops contains fluid, small amount of oral contrast and some gas. No definite separate fluid collection is seen.  A Foley catheter is in the urinary bladder with associated air in the bladder. No free peritoneal air seen. Atheromatous arterial calcifications, including the  coronary arteries. Cholecystectomy clips.  Small left pleural effusion. Bilateral lower lobe atelectasis. Multiple colonic diverticula. Small left inguinal hernia containing fat. Diffuse subcutaneous edema. Lumbar and lower thoracic spine degenerative changes and scoliosis.  IMPRESSION: 1. 7.7 x 7.7 x 5.3 cm fluid collection with rim enhancement beneath the left hemidiaphragm and partially surrounding the spleen. This is suspicious for an abscess. 2. 9.8 x 8.3 x 4.2 cm arm left mid abdominal fluid collection with peripheral rim enhancement, suspicious for an abscess. 3. Focal ileus or partial obstruction involving small bowel in the inferior abdomen and upper pelvis. 4. Small left pleural effusion. 5. Bilateral lower lobe atelectasis. 6. Colonic diverticulosis.   Electronically Signed   By: Beckie Salts M.D.   On: 04/02/2015 17:38   Ct Image Guided Drainage Percut Cath  Peritoneal Retroperit  04/04/2015   CLINICAL DATA:  81 year old with postoperative fluid collections. Concern for intra-abdominal abscesses.  EXAM: CT-GUIDED DRAIN PLACEMENT IN LEFT UPPER ABDOMINAL FLUID COLLECTION  CT-GUIDED DRAIN PLACEMENT IN LEFT LATERAL ABDOMINAL FLUID COLLECTION  Physician: Rachelle Hora. Lowella Dandy, MD  FLUOROSCOPY TIME:  None  MEDICATIONS: 4 mg versed, 100 mcg of fentanyl. A radiology nurse monitored the patient for moderate sedation.  ANESTHESIA/SEDATION: Moderate sedation time: 50 min  PROCEDURE: Informed consent was obtained for CT-guided drain placements. Patient was placed supine on the CT scanner. Images through the abdomen were obtained. The collections in the left upper abdomen and lateral left abdomen were identified. The left side of the abdomen was prepped and draped in sterile fashion. The collection in the left upper abdomen and perisplenic region was initially targeted. Skin was anesthetized with 1% lidocaine. 18 gauge needle was directed into the fluid collection just posterior to the spleen and cloudy yellow fluid was  aspirated. A stiff Amplatz wire was placed and the tract was dilated to accommodate a 10.2 [place] multipurpose drain. Catheter sutured to skin and attached to a suction bulb.  Attention was directed to the left lateral abdominal fluid collection. The left lateral abdomen was anesthetized with 1% lidocaine. 18 gauge needle directed into the fluid collection and cloudy yellow fluid was aspirated. Stiff Amplatz wire was placed and the tract was dilated to accommodate a 10.2 [place] multipurpose drain. Catheter sutured to skin and attached to a suction bulb.  FINDINGS: Left upper abdominal fluid collection in the perisplenic region. Drain was successfully placed just posterior to the spleen. There is a second collection in the lateral left mid abdomen. Drain successfully placed in this collection.  Estimated blood loss: Minimal  COMPLICATIONS: None  IMPRESSION: Placement of CT-guided drainage catheters in two left abdominal fluid collections. Cloudy yellow fluid was removed from both collections. Samples were sent for culture.   Electronically Signed   By: Richarda Overlie M.D.   On: 04/04/2015 08:32   Ct Image Guided Drainage Percut Cath  Peritoneal Retroperit  04/04/2015   CLINICAL DATA:  81 year old with postoperative fluid collections. Concern for intra-abdominal abscesses.  EXAM: CT-GUIDED DRAIN PLACEMENT IN LEFT UPPER ABDOMINAL FLUID COLLECTION  CT-GUIDED DRAIN PLACEMENT IN LEFT LATERAL ABDOMINAL FLUID COLLECTION  Physician: Rachelle Hora. Lowella Dandy, MD  FLUOROSCOPY TIME:  None  MEDICATIONS: 4 mg versed, 100 mcg of fentanyl. A radiology nurse monitored the patient for moderate sedation.  ANESTHESIA/SEDATION: Moderate sedation time: 50 min  PROCEDURE: Informed consent was obtained for CT-guided drain placements. Patient was placed supine on the CT scanner. Images through the abdomen were obtained. The collections in the left upper abdomen and lateral left abdomen were identified. The left side of the abdomen was prepped and draped  in sterile fashion. The collection in the left upper abdomen and perisplenic region was initially targeted. Skin was anesthetized with 1% lidocaine. 18 gauge needle was directed into the fluid collection just posterior to the spleen and cloudy yellow fluid was aspirated. A stiff Amplatz wire was placed and the tract was dilated to accommodate a 10.2 [place] multipurpose drain. Catheter sutured to skin and attached to a suction bulb.  Attention was directed to the left lateral abdominal fluid collection. The left lateral abdomen was anesthetized with 1% lidocaine. 18 gauge needle directed into the fluid collection and cloudy yellow fluid was aspirated. Stiff Amplatz wire was placed and the tract was dilated to accommodate a 10.2 [place] multipurpose drain. Catheter sutured to skin and attached to a suction bulb.  FINDINGS: Left upper abdominal fluid collection in the perisplenic region. Drain was successfully placed just posterior to the spleen. There is a second collection in the lateral left mid abdomen. Drain successfully placed in this collection.  Estimated blood loss: Minimal  COMPLICATIONS: None  IMPRESSION: Placement of CT-guided drainage catheters in two left abdominal fluid collections. Cloudy yellow fluid was removed from both collections. Samples were sent for culture.   Electronically Signed   By: Richarda Overlie M.D.   On: 04/04/2015 08:32    Labs:  CBC:  Recent Labs  04/01/15 0450 04/02/15 0537 04/03/15 0545 04/04/15 0650  WBC 14.8* 16.9* 16.9* 16.7*  HGB 6.4* 9.1* 9.0* 8.8*  HCT 19.5* 28.7* 29.3* 28.3*  PLT 297 364 370 465*    COAGS:  Recent Labs  03/27/15 2348 04/03/15 1140  INR 1.06 1.22  APTT 40*  --     BMP:  Recent Labs  04/02/15 0537 04/02/15 1201 04/03/15 0545 04/04/15 0650 04/05/15 1000  NA 135  --  132* 132* 132*  K 5.8* 5.7* 4.9 4.7 4.5  CL 103  --  100* 103 103  CO2 25  --  24 21* 22  GLUCOSE 163*  --  195* 174* 113*  BUN 48*  --  45* 46* 46*  CALCIUM  8.5*  --  8.3* 8.2* 8.3*  CREATININE 0.80  --  0.75 0.83 0.83  GFRNONAA >60  --  >60 >60 >60  GFRAA >60  --  >60 >60 >60    LIVER FUNCTION TESTS:  Recent Labs  03/28/15 0357 03/29/15 0528 03/31/15 0350 04/04/15 0650  BILITOT 0.2* 0.4 0.4 0.5  AST 38 33 26 45*  ALT 18 18 14 26   ALKPHOS 86 98 98 139*  PROT 5.6* 5.6* 5.9* 6.8  ALBUMIN 1.9* 1.7* 1.8* 1.9*    Assessment and Plan: S/p LOA for SBO 5/13, drainage of left upper/lateral abd fluid collections 5/22; fluid cx's neg to date; afebrile; CBC pending today; would check f/u CT to include lower lung fields later this week to assess adequacy of drainage ; other plans as per CCS/IM   Signed: D. Jeananne Rama 04/05/2015, 1:06 PM   I spent a total of 15 minutes in face to face in clinical consultation/evaluation, greater than 50% of which was counseling/coordinating care for  left abd fluid collection drains

## 2015-04-05 NOTE — Progress Notes (Signed)
[facility] Progress Note  12 Days Post-Op  Subjective: Pt was resting comfortably upon entering the room.  She's looks much more comfortable that yesterday.  No N/V.  C/o central abdominal pain.  Last BM 04/01/15, Tolerating D3 diet with assistance.    Objective: Vital signs in last 24 hours: Temp:  [98 F (36.7 C)-98.6 F (37 C)] 98.2 F (36.8 C) (05/24 0442) Pulse Rate:  [94-106] 95 (05/24 0442) Resp:  [18] 18 (05/24 0442) BP: (150-155)/(63-75) 153/68 mmHg (05/24 0442) SpO2:  [99 %-100 %] 99 % (05/24 0442) Last BM Date: 04/01/15  Intake/Output from previous day: 05/23 0701 - 05/24 0700 In: 1369.7 [P.O.:240; TPN:1099.7] Out: 2914 [Urine:2850; Drains:64] Intake/Output this shift:    PE: Gen:  Alert, NAD, pleasant Abd: Soft, ND, tender in the mid abdomen and over left lateral IR drains, drains with yellow clear drainage, +BS, no HSM, incisions C/D/I with staples in place over midline incision, few lap staple sites and drain removed  Lab Results:   Recent Labs  04/03/15 0545 04/04/15 0650  WBC 16.9* 16.7*  HGB 9.0* 8.8*  HCT 29.3* 28.3*  PLT 370 465*   BMET  Recent Labs  04/03/15 0545 04/04/15 0650  NA 132* 132*  K 4.9 4.7  CL 100* 103  CO2 24 21*  GLUCOSE 195* 174*  BUN 45* 46*  CREATININE 0.75 0.83  CALCIUM 8.3* 8.2*   PT/INR  Recent Labs  04/03/15 1140  LABPROT 15.5*  INR 1.22   CMP     Component Value Date/Time   NA 132* 04/04/2015 0650   K 4.7 04/04/2015 0650   CL 103 04/04/2015 0650   CO2 21* 04/04/2015 0650   GLUCOSE 174* 04/04/2015 0650   BUN 46* 04/04/2015 0650   CREATININE 0.83 04/04/2015 0650   CALCIUM 8.2* 04/04/2015 0650   PROT 6.8 04/04/2015 0650   ALBUMIN 1.9* 04/04/2015 0650   AST 45* 04/04/2015 0650   ALT 26 04/04/2015 0650   ALKPHOS 139* 04/04/2015 0650   BILITOT 0.5 04/04/2015 0650   GFRNONAA >60 04/04/2015 0650   GFRAA >60 04/04/2015 0650   Lipase     Component Value Date/Time   LIPASE 30 03/18/2015  0714       Studies/Results: Ct Image Guided Drainage Percut Cath  Peritoneal Retroperit  04/04/2015   CLINICAL DATA:  81 year old with postoperative fluid collections. Concern for intra-abdominal abscesses.  EXAM: CT-GUIDED DRAIN PLACEMENT IN LEFT UPPER ABDOMINAL FLUID COLLECTION  CT-GUIDED DRAIN PLACEMENT IN LEFT LATERAL ABDOMINAL FLUID COLLECTION  Physician: Rachelle Hora. Lowella Dandy, MD  FLUOROSCOPY TIME:  None  MEDICATIONS: 4 mg versed, 100 mcg of fentanyl. A radiology nurse monitored the patient for moderate sedation.  ANESTHESIA/SEDATION: Moderate sedation time: 50 min  PROCEDURE: Informed consent was obtained for CT-guided drain placements. Patient was placed supine on the CT scanner. Images through the abdomen were obtained. The collections in the left upper abdomen and lateral left abdomen were identified. The left side of the abdomen was prepped and draped in sterile fashion. The collection in the left upper abdomen and perisplenic region was initially targeted. Skin was anesthetized with 1% lidocaine. 18 gauge needle was directed into the fluid collection just posterior to the spleen and cloudy yellow fluid was aspirated. A stiff Amplatz wire was placed and the tract was dilated to accommodate a 10.2 [place] multipurpose drain. Catheter sutured to skin and attached to a suction bulb.  Attention was directed to the left lateral abdominal fluid collection. The left lateral abdomen was  anesthetized with 1% lidocaine. 18 gauge needle directed into the fluid collection and cloudy yellow fluid was aspirated. Stiff Amplatz wire was placed and the tract was dilated to accommodate a 10.2 [place] multipurpose drain. Catheter sutured to skin and attached to a suction bulb.  FINDINGS: Left upper abdominal fluid collection in the perisplenic region. Drain was successfully placed just posterior to the spleen. There is a second collection in the lateral left mid abdomen. Drain successfully placed in this collection.  Estimated  blood loss: Minimal  COMPLICATIONS: None  IMPRESSION: Placement of CT-guided drainage catheters in two left abdominal fluid collections. Cloudy yellow fluid was removed from both collections. Samples were sent for culture.   Electronically Signed   By: Richarda Overlie M.D.   On: 04/04/2015 08:32   Ct Image Guided Drainage Percut Cath  Peritoneal Retroperit  04/04/2015   CLINICAL DATA:  81 year old with postoperative fluid collections. Concern for intra-abdominal abscesses.  EXAM: CT-GUIDED DRAIN PLACEMENT IN LEFT UPPER ABDOMINAL FLUID COLLECTION  CT-GUIDED DRAIN PLACEMENT IN LEFT LATERAL ABDOMINAL FLUID COLLECTION  Physician: Rachelle Hora. Lowella Dandy, MD  FLUOROSCOPY TIME:  None  MEDICATIONS: 4 mg versed, 100 mcg of fentanyl. A radiology nurse monitored the patient for moderate sedation.  ANESTHESIA/SEDATION: Moderate sedation time: 50 min  PROCEDURE: Informed consent was obtained for CT-guided drain placements. Patient was placed supine on the CT scanner. Images through the abdomen were obtained. The collections in the left upper abdomen and lateral left abdomen were identified. The left side of the abdomen was prepped and draped in sterile fashion. The collection in the left upper abdomen and perisplenic region was initially targeted. Skin was anesthetized with 1% lidocaine. 18 gauge needle was directed into the fluid collection just posterior to the spleen and cloudy yellow fluid was aspirated. A stiff Amplatz wire was placed and the tract was dilated to accommodate a 10.2 [place] multipurpose drain. Catheter sutured to skin and attached to a suction bulb.  Attention was directed to the left lateral abdominal fluid collection. The left lateral abdomen was anesthetized with 1% lidocaine. 18 gauge needle directed into the fluid collection and cloudy yellow fluid was aspirated. Stiff Amplatz wire was placed and the tract was dilated to accommodate a 10.2 [place] multipurpose drain. Catheter sutured to skin and attached to a suction  bulb.  FINDINGS: Left upper abdominal fluid collection in the perisplenic region. Drain was successfully placed just posterior to the spleen. There is a second collection in the lateral left mid abdomen. Drain successfully placed in this collection.  Estimated blood loss: Minimal  COMPLICATIONS: None  IMPRESSION: Placement of CT-guided drainage catheters in two left abdominal fluid collections. Cloudy yellow fluid was removed from both collections. Samples were sent for culture.   Electronically Signed   By: Richarda Overlie M.D.   On: 04/04/2015 08:32    Anti-infectives: Anti-infectives    Start     Dose/Rate Route Frequency Ordered Stop   03/26/15 1600  cefTRIAXone (ROCEPHIN) 2 g in dextrose 5 % 50 mL IVPB - Premix    Comments:  Pharmacy may adjust dosing strength / duration / interval for maximal efficacy   2 g 100 mL/hr over 30 Minutes Intravenous Every 24 hours 03/26/15 1007 03/30/15 1535   03/26/15 1200  metroNIDAZOLE (FLAGYL) IVPB 500 mg     500 mg 100 mL/hr over 60 Minutes Intravenous Every 6 hours 03/26/15 1007 03/30/15 1850   03/25/15 1600  cefOXitin (MEFOXIN) 1 g in dextrose 5 % 50 mL IVPB  Status:  Discontinued     1 g 100 mL/hr over 30 Minutes Intravenous Every 12 hours 03/25/15 0801 03/26/15 1007   03/25/15 0800  cefOXitin (MEFOXIN) 1 g in dextrose 5 % 50 mL IVPB  Status:  Discontinued     1 g 100 mL/hr over 30 Minutes Intravenous 3 times per day 03/25/15 0752 03/25/15 0758   03/24/15 1700  cefOXitin (MEFOXIN) 1 g in dextrose 5 % 50 mL IVPB     1 g 100 mL/hr over 30 Minutes Intravenous Every 6 hours 03/24/15 1511 03/25/15 0512   03/24/15 0915  cefOXitin (MEFOXIN) 2 g in dextrose 5 % 50 mL IVPB    Comments:  Pharmacy may adjust dosing strength, interval, or rate of medication as needed for optimal therapy for the patient Send with patient on call to the OR.  Anesthesia to complete antibiotic administration <34min prior to incision per [facility].   2 g 100 mL/hr over 30 Minutes  Intravenous On call to O.R. 03/24/15 0910 03/24/15 1042       Assessment/Plan POD #12, s/p lap converted to open LOA for SBO, Dr. Daphine Deutscher -SLP following, On D3 diet -Mobilize as able s/p CVA 03/27/15 -IV pain meds prn for pain control, limit narcotics if possible -Wean TNP back to 1/2 doseto encourage appetite -Abx: Rocephin/Flagyl D5/5 now discontinued -Pulmonary toilet -Drain has been removed 03/27/15 -Remove staples prior to discharge around POD #14 and replace with steristrips -New CT on 04/02/15 shows 2 intra-abdominal fluid collections, s/p IR drainage on 04/03/15 - left upper abdomen & Left lateral abdomen, pending cultures -Repeat CT once drainage subsides on 5/28? PCM/TPN -Continue TPN at 1/2 dose until PO intake improves, calorie count, dietitian Leukocytosis -16.7, intra-abdominal fluid collections ABL Anemia -Hgb 8.8 yesterday, 2 units pRBC on 04/01/15 ?CVA -per neurology and primary   DVT prophylaxis -SCDs, heparin held for thrombocytopenia per primary service ?resume?, on aspirin    LOS: 18 days    DORT, Anh Bigos 04/05/2015, 7:40 AM Pager: [phone]

## 2015-04-05 NOTE — Progress Notes (Signed)
Progress Note   [name]:[id number] DOB: [birth year] DOA: 03/18/2015 PCP: [name], MD   Brief Narrative:   [name] is an 81 y.o. female with a PMH of hypertension, CAD, hyperlipidemia and diabetes who was admitted 03/18/15 with SBO secondary to adhesions. She failed conservative therapy and underwent laparoscopic lysis of adhesions/laparotomy with bowel decompression on 03/24/15. Her hospital course was complicated by sepsis secondary to peritoneal contamination with transfer to the SDU on 03/25/15. She also developed left-sided facial droop/right arm weakness 03/27/15 with CT scan showing a 4 mm lacunar infarct in the left lentiform nucleus. MRI failed to show any abnormality. Due to failure to improve an ongoing high need for pain control, a repeat CT scan was done 04/02/15 which showed 2 large fluid collections. She went percutaneous drainage with placement of abscess drains by IR 04/04/15. Pain continues to limit progress and we are now in the process of weaning her TPN in the hopes that she will begin to eat so that discharge planning can occur.  Assessment/Plan:   Principal Problem:   Small bowel obstruction s/p exlap/LOA/decompression 03/25/2015 complicated by severe sepsis secondary to peritonitis / abdominal pain now complicated by intra-abdominal abscess/fluid collection - Status post laparoscopic lysis of adhesions/laparotomy with bowel decompression 03/24/15. - Subsequently developed acute peritonitis/sepsis. - Initially treated with Mefoxin starting 03/25/15. Antibiotics switched to Flagyl/Rocephin 03/26/15.   - CT scan of the abdomen/pelvis done 04/02/15 which showed 2 large fluid collections.  - 2 percutaneous drains placed by IR 04/03/15 to drain fluid collections. Follow-up abscess cultures. - Plan to repeat CT scan once drainage subsides.  Active Problems:   Facial droop / right arm weakness / possible acute CVA left lentiform nucleus - On 03/27/15, CT of the  head done to evaluate symptoms showed an acute 4 mm lacunar infarct in the left lentiform nucleus. - Subsequent MRI negative, although clinical findings consistent with acute CVA. MRA negative for stenosis. - 2-D echo with normal EF, no diastolic dysfunction noted. No intracardiac masses or thrombi. - Carotid Dopplers negative for significant stenosis (1-39% bilaterally). - Hemoglobin A1c 6.3%. Cholesterol 99. - Evaluated by neurology, no further stroke workup recommended. - Continue aspirin. - PT/OT.    Hypokalemia/hypomagnesemia - Monitor and replace electrolytes as needed.    Acute postoperative blood loss anemia - Status post 2 units of PRBCs 04/01/15. - Continue to monitor hemoglobin and transfuse for hemoglobin less than 7.    Diabetes type 2, controlled - Currently being managed with moderate scale SSI every 6 hours. CBGs 103-156. - Hemoglobin A1c 6.3%.    Anxiety state/Adjustment disorder with mixed anxiety and depressed mood - Mood has been depressed with limited motivation to participate in PT.    Coronary atherosclerosis - Continue aspirin therapy.    Acute renal failure secondary to sepsis  - Resolved with IV fluids.    Right foot pain  - Radiographs negative for fracture. Degenerative changes noted.    Polymyalgia rheumatica/Chronic fatigue disorder - PT/OT.    Elevated blood pressure - Continue as needed Lopressor.    Malnutrition of moderate degree - Continue TPN for nutritional support. Begin to wean.    Acute delirium - Avoid sedating medications. Family requests patient does not get Ativan or Haldol.    Thrombocytopenia - Mild. Likely related to recent sepsis.    DVT Prophylaxis - Continue SCDs.  Code Status: Full. Family Communication: Son updated at the bedside 04/04/15, no family present today.   Disposition Plan:  Home with daughter when diet advanced, difficult to predict due to long hospitalization, ongoing need for IV pain medication, now with  new fluid collections needing to be drained.   IV Access:    PICC placed 03/23/15   Procedures and diagnostic studies:   Ct Head Wo Contrast  03/27/2015   CLINICAL DATA:  RIGHT-sided facial droop. Symptoms began earlier today. Altered mental status.  EXAM: CT HEAD WITHOUT CONTRAST  TECHNIQUE: Contiguous axial images were obtained from the base of the skull through the vertex without intravenous contrast.  COMPARISON:  05/27/2014.  FINDINGS: Possible acute 4 mm lacune, LEFT lentiform nucleus as seen on image 12 series 5. This was not clearly present on in 2015 scan.  No other areas of concern for cortical or subcortical infarction.  No hemorrhage, mass lesion, hydrocephalus, or extra-axial fluid.  Cerebral and cerebellar atrophy. Generalized white matter hypoattenuation, likely small vessel disease. Than the calvarium intact. Vascular calcification. No sinus or mastoid disease. Dense lenticular opacities.  IMPRESSION: Possible acute 4 mm lacunar infarct LEFT lentiform nucleus. If further investigation desired, and no contraindications, consider MRI brain.   Electronically Signed   By: Davonna Belling M.D.   On: 03/27/2015 16:25   Mr [name] Head Wo Contrast  03/28/2015   CLINICAL DATA:  81 year old with right-sided facial droop. Altered mental status. Subsequent encounter.  EXAM: MRI HEAD WITHOUT CONTRAST  MRA HEAD WITHOUT CONTRAST  TECHNIQUE: Multiplanar, multiecho pulse sequences of the brain and surrounding structures were obtained without intravenous contrast. Angiographic images of the head were obtained using MRA technique without contrast.  COMPARISON:  03/27/2015 head CT.  FINDINGS: MRI HEAD FINDINGS  No acute infarct.  No intracranial hemorrhage.  Remote tiny infarct right cerebellum. Minimal small vessel disease type changes.  Incidentally noted is a prominent peri vascular space left lenticular nucleus.  Global atrophy without hydrocephalus.  No intracranial mass lesion noted on this unenhanced  exam.  Cervical medullary junction unremarkable. Mild spinal stenosis C3-4.  Expanded partially empty sella without other findings of pseudotumor cerebri  Pineal region unremarkable.  No intracranial mass lesion noted on this unenhanced exam.  MRA HEAD FINDINGS  Mild narrowing pre cavernous/ cavernous segment right internal carotid artery.  Mild ectasia left internal carotid artery cavernous segment. The minimal bulge along the left internal carotid artery cavernous segment most likely related to slight ectasia of the left ophthalmic artery origin rather than small aneurysm. Mild motion degradation limits evaluation.  Slightly ectatic anterior communicating artery without aneurysm.  Middle cerebral artery mild branch vessel irregularity bilaterally.  No significant stenosis distal vertebral arteries or basilar artery. Regions of mild irregularity and slight narrowing.  IMPRESSION: MRI HEAD  No acute infarct.  Remote tiny infarct right cerebellum.  Incidentally noted is a prominent peri vascular space left lenticular nucleus.  Global atrophy without hydrocephalus.  Expanded partially empty sella without other findings of pseudotumor cerebri  MRA HEAD FINDINGS  No medium or large size vessel significant stenosis or occlusion. Evaluation of branch vessels slightly limited by motion degradation.   Electronically Signed   By: Lacy Duverney M.D.   On: 03/28/2015 12:02   Mr Brain Wo Contrast  03/28/2015   CLINICAL DATA:  81 year old with right-sided facial droop. Altered mental status. Subsequent encounter.  EXAM: MRI HEAD WITHOUT CONTRAST  MRA HEAD WITHOUT CONTRAST  TECHNIQUE: Multiplanar, multiecho pulse sequences of the brain and surrounding structures were obtained without intravenous contrast. Angiographic images of the head were obtained using MRA technique without contrast.  COMPARISON:  03/27/2015 head CT.  FINDINGS: MRI HEAD FINDINGS  No acute infarct.  No intracranial hemorrhage.  Remote tiny infarct right  cerebellum. Minimal small vessel disease type changes.  Incidentally noted is a prominent peri vascular space left lenticular nucleus.  Global atrophy without hydrocephalus.  No intracranial mass lesion noted on this unenhanced exam.  Cervical medullary junction unremarkable. Mild spinal stenosis C3-4.  Expanded partially empty sella without other findings of pseudotumor cerebri  Pineal region unremarkable.  No intracranial mass lesion noted on this unenhanced exam.  MRA HEAD FINDINGS  Mild narrowing pre cavernous/ cavernous segment right internal carotid artery.  Mild ectasia left internal carotid artery cavernous segment. The minimal bulge along the left internal carotid artery cavernous segment most likely related to slight ectasia of the left ophthalmic artery origin rather than small aneurysm. Mild motion degradation limits evaluation.  Slightly ectatic anterior communicating artery without aneurysm.  Middle cerebral artery mild branch vessel irregularity bilaterally.  No significant stenosis distal vertebral arteries or basilar artery. Regions of mild irregularity and slight narrowing.  IMPRESSION: MRI HEAD  No acute infarct.  Remote tiny infarct right cerebellum.  Incidentally noted is a prominent peri vascular space left lenticular nucleus.  Global atrophy without hydrocephalus.  Expanded partially empty sella without other findings of pseudotumor cerebri  MRA HEAD FINDINGS  No medium or large size vessel significant stenosis or occlusion. Evaluation of branch vessels slightly limited by motion degradation.   Electronically Signed   By: Lacy Duverney M.D.   On: 03/28/2015 12:02   Ct Abdomen Pelvis W Contrast  04/02/2015   CLINICAL DATA:  Nonspecific abdominal pain. Status post recent laparotomy for small bowel obstruction.  EXAM: CT ABDOMEN AND PELVIS WITH CONTRAST  TECHNIQUE: Multidetector CT imaging of the abdomen and pelvis was performed using the standard protocol following bolus administration of  intravenous contrast.  CONTRAST:  44mL OMNIPAQUE IOHEXOL 300 MG/ML SOLN, OMNIPAQUE IOHEXOL 300 MG/ML SOLN  COMPARISON:  Renal ultrasound dated 03/25/2015 and abdomen and pelvis CT dated 03/18/2015.  FINDINGS: Cholecystectomy clips. Interval midline surgical scar in the skin clips at the level of the pelvis. Loculated fluid with peripheral rim enhancement beneath the left hemidiaphragm and partially surrounding the spleen. This measures 7.7 x 5.3 cm on axial image 16. This measures 7.7 cm in length on coronal image number 92.  There is an additional fluid collection in the lateral aspect of the left mid abdomen, measuring 8.3 x 4.2 cm on axial image number 31. This also has peripheral rim enhancement. This measures 9.8 cm in length on coronal image number 64.  There are dilated small bowel loops in the lower abdomen and upper pelvis. Above the urinary bladder, one of these loops contains fluid, small amount of oral contrast and some gas. No definite separate fluid collection is seen.  A Foley catheter is in the urinary bladder with associated air in the bladder. No free peritoneal air seen. Atheromatous arterial calcifications, including the coronary arteries. Cholecystectomy clips.  Small left pleural effusion. Bilateral lower lobe atelectasis. Multiple colonic diverticula. Small left inguinal hernia containing fat. Diffuse subcutaneous edema. Lumbar and lower thoracic spine degenerative changes and scoliosis.  IMPRESSION: 1. 7.7 x 7.7 x 5.3 cm fluid collection with rim enhancement beneath the left hemidiaphragm and partially surrounding the spleen. This is suspicious for an abscess. 2. 9.8 x 8.3 x 4.2 cm arm left mid abdominal fluid collection with peripheral rim enhancement, suspicious for an abscess. 3. Focal ileus or partial obstruction  involving small bowel in the inferior abdomen and upper pelvis. 4. Small left pleural effusion. 5. Bilateral lower lobe atelectasis. 6. Colonic diverticulosis.    Electronically Signed   By: Beckie Salts M.D.   On: 04/02/2015 17:38   Ct Abdomen Pelvis W Contrast  03/18/2015   CLINICAL DATA:  Nausea and vomiting for 4 days, anxiety, status post hysterectomy CT pelvis 05/27/2014  EXAM: CT ABDOMEN AND PELVIS WITH CONTRAST  TECHNIQUE: Multidetector CT imaging of the abdomen and pelvis was performed using the standard protocol following bolus administration of intravenous contrast.  CONTRAST:  50mL OMNIPAQUE IOHEXOL 300 MG/ML SOLN, OMNIPAQUE IOHEXOL 300 MG/ML SOLN  COMPARISON:  None.  FINDINGS: Sagittal images of the spine shows diffuse osteopenia. Degenerative changes thoracolumbar spine. The lung bases are unremarkable.  The patient is status post cholecystectomy. Mild intrahepatic biliary ductal dilatation. There is CBD dilatation up to 1.1 cm. No focal hepatic mass. The pancreas, spleen and adrenal glands are unremarkable. Kidneys are symmetrical in size and enhancement. No hydronephrosis or hydroureter. Delayed renal images shows bilateral renal symmetrical excretion.  Atherosclerotic calcifications of abdominal aorta and iliac arteries. Moderate stool noted in right colon and proximal transverse colon. No colonic obstruction. The descending colon and sigmoid colon are empty collapsed. Some stool noted within rectum.  There are fluid distended small bowel loops in mid upper abdomen and left lower abdomen and pelvis. Small amount of fluid/stranding noted a adjacent to small bowel loops in left lower quadrant see axial image 57. Distal small bowel is decreased caliber collapsed. Findings are consistent with small bowel obstruction.  Mild distended urinary bladder. The patient is status post hysterectomy. The terminal ileum is small caliber decompressed. Contrast material is noted within stomach. No any contrast material is noted within small bowel or colon.  IMPRESSION: 1. There are fluid distended small bowel loops with multiple air-fluid levels highly suspicious for  small bowel obstruction. Distal small bowel is small caliber decompressed. Terminal ileum is small caliber. Small amount of fluid/stranding noted adjacent to small bowel loops in left lower quadrant please see images 49 and 57. 2. Moderate stool noted in right colon and proximal transverse colon. Descending colon and sigmoid colon are empty collapsed. 3. No pericecal inflammation. 4. No hydronephrosis or hydroureter. 5. Status post cholecystectomy. Mild intrahepatic biliary ductal dilatation. CBD dilatation up to 1.1 cm. 6. No hydronephrosis or hydroureter. 7. Status post hysterectomy. These results were called by telephone at the time of interpretation on 03/18/2015 at 8:59 am to Dr. Gerhard Munch , who verbally acknowledged these results.   Electronically Signed   By: Natasha Mead M.D.   On: 03/18/2015 08:59   US Renal  03/25/2015   CLINICAL DATA:  Acute renal failure.  EXAM: RENAL / URINARY TRACT ULTRASOUND COMPLETE  COMPARISON:  None.  FINDINGS: Right Kidney:  Length: 8.1 cm. Increased echogenicity consistent with chronic medical renal disease. No mass or hydronephrosis visualized.  Left Kidney:  Length: 9.3 cm. Increased echogenicity consistent chronic medical renal disease. No mass or hydronephrosis visualized.  Bladder:  Bladder decompressed by Foley catheter.  IMPRESSION: Bilateral echodense kidneys consistent chronic medical renal disease. No acute abnormality. No hydronephrosis or bladder distention.   Electronically Signed   By: Maisie Fus  Register   On: 03/25/2015 13:27   Dg Chest Port 1 View  03/27/2015   CLINICAL DATA:  Left PICC placement. Nausea and vomiting. Initial encounter.  EXAM: PORTABLE CHEST - 1 VIEW  COMPARISON:  Chest radiograph performed 03/26/2015  FINDINGS:  The patient's left PICC is noted ending about the proximal SVC. An enteric tube is noted extending below the diaphragm.  The lungs are hypoexpanded. Bibasilar and right upper lung zone airspace opacities may reflect atelectasis or  pneumonia. No pleural effusion or pneumothorax is seen  The cardiomediastinal silhouette is borderline normal in size. No acute osseous abnormalities are identified.  IMPRESSION: 1. Left PICC noted ending about the proximal SVC. 2. Lungs hypoexpanded. Bibasilar and right upper lung zone airspace opacities may reflect atelectasis or pneumonia.  These results were called by telephone at the time of interpretation on 03/27/2015 at 7:33 pm to [facility], who verbally acknowledged these results.   Electronically Signed   By: Roanna Raider M.D.   On: 03/27/2015 19:34   Dg Chest Port 1 View  03/26/2015   CLINICAL DATA:  Atelectasis  EXAM: PORTABLE CHEST - 1 VIEW  COMPARISON:  Yesterday  FINDINGS: NG tube and left PICC are stable. Upper normal heart size. Low volumes. Bibasilar atelectasis is not significantly changed. No pneumothorax.  IMPRESSION: Stable bibasilar atelectasis.   Electronically Signed   By: Jolaine Click M.D.   On: 03/26/2015 08:14   Dg Chest Port 1 View  03/25/2015   CLINICAL DATA:  Tachycardia and hypotension; concern for sepsis  EXAM: PORTABLE CHEST - 1 VIEW  COMPARISON:  May 27, 2014  FINDINGS: There is patchy atelectasis in both lung bases. The degree of inspiration is shallow. There is no frank airspace consolidation. Heart is upper normal in size with pulmonary vascularity within normal limits.  Central catheter tip is in the superior vena cava. Nasogastric tube tip and side port are in the distal stomach. No pneumothorax.  IMPRESSION: Bibasilar atelectasis. No pneumothorax. Degree of inspiration shallow.   Electronically Signed   By: Bretta Bang III M.D.   On: 03/25/2015 09:03   Dg Abd Portable 1v  03/24/2015   CLINICAL DATA:  Small-bowel obstruction  EXAM: PORTABLE ABDOMEN - 1 VIEW  COMPARISON:  03/23/2015  FINDINGS: NG tube is been placed with the tip in the gastric antrum  Small bowel dilatation shows mild interval improvement. Colon is decompressed  with moderate stool in the right colon. Surgical clips in the gallbladder fossa.  IMPRESSION: Mild improvement in small bowel obstruction. NG tube in the gastric antrum.   Electronically Signed   By: Marlan Palau M.D.   On: 03/24/2015 08:25   Dg Abd Portable 1v  03/23/2015   CLINICAL DATA:  Followup small bowel obstruction.  EXAM: PORTABLE ABDOMEN - 1 VIEW  COMPARISON:  03/22/2015  FINDINGS: Dilated loops small bowel are similar to the prior exam allowing for differences in radiographic technique different degrees of magnification.  There is a small amount of air in a nondistended colon.  IMPRESSION: 1. Persistent high-grade partial small bowel obstruction. No significant change from the previous day's study.   Electronically Signed   By: Amie Portland M.D.   On: 03/23/2015 10:01   Dg Abd Portable 1v  03/22/2015   CLINICAL DATA:  81 year old female with recent small bowel obstruction. Initial encounter.  EXAM: PORTABLE ABDOMEN - 1 VIEW  COMPARISON:  03/21/2015 and earlier, including CT Abdomen and Pelvis 03/18/2015.  FINDINGS: Portable AP supine view at 0517 hrs. Enteric tube has been removed. Recurrent dilated gas-filled small bowel loops in the mid abdomen up to 41 mm diameter. Abundant stool now at the splenic flexure. Decreased distal colon gas in the pelvis. Stable cholecystectomy clips. Stable abdominal  and pelvic visceral contours. Grossly stable lung bases. Stable visualized osseous structures.  IMPRESSION: NG tube removed with recurrent small bowel obstruction.   Electronically Signed   By: Odessa Fleming M.D.   On: 03/22/2015 07:42   Dg Abd Portable 1v  03/21/2015   CLINICAL DATA:  Small-bowel obstruction .  EXAM: PORTABLE ABDOMEN - 1 VIEW  COMPARISON:  03/20/2015 .  FINDINGS: NG tube noted with tip in the upper portion stomach. Slight advancement should be considered . Surgical clips right upper quadrant Soft tissue structures are unremarkable. Small-bowel distention has improved. No free air. Stool  noted throughout the colon. No acute bony abnormality.  IMPRESSION: 1. NG tube noted with tip in the upper portion of the stomach. Advancement suggested.  2. Interim improvement of small bowel distention. Colonic gas pattern is normal with stool throughout the colon.  These results will be called to the ordering clinician or representative by the Radiologist Assistant, and communication documented in the PACS or zVision Dashboard.   Electronically Signed   By: Maisie Fus  Register   On: 03/21/2015 07:32   Dg Abd Portable 1v  03/20/2015   CLINICAL DATA:  Followup small bowel obstruction.  EXAM: PORTABLE ABDOMEN - 1 VIEW  COMPARISON:  03/19/2015  FINDINGS: Mild small bowel dilation persists most evident in the left upper quadrant. There has been no substantial change from the prior study. Residual contrast is noted in the bladder, also similar to the prior exam. Nasogastric tube tip projects in the distal stomach.  IMPRESSION: Small bowel dilation persists consistent with a persistent partial small bowel obstruction. No change from the previous day's study.   Electronically Signed   By: Amie Portland M.D.   On: 03/20/2015 07:29   Dg Abd Portable 1v  03/19/2015   CLINICAL DATA:  Small bowel obstruction protocol. Small bowel obstruction.  EXAM: PORTABLE ABDOMEN - 1 VIEW  COMPARISON:  03/18/2015.  FINDINGS: This is a 24 hr film. There is no contrast identified in the cecum. On the prior exam at 2046 hr yesterday, oral contrast was present in the stomach. Excreted contrast is present within the urinary bladder. Dilation of small bowel loops measures 36 mm. Cholecystectomy clips are present in the right upper quadrant.  There is less small bowel contrast than expected which may be due to dilution in the proximal small bowel. Nasogastric tube remains in the stomach with the tip at the antrum.  Large amount of stool is present in the colon.  IMPRESSION: Small bowel obstruction with no contrast identified in the cecum at 24  hr.   Electronically Signed   By: Andreas Newport M.D.   On: 03/19/2015 17:11   Dg Abd Portable 1v-small Bowel Obstruction Protocol-initial, 8 Hr Delay  03/19/2015   CLINICAL DATA:  Small bowel obstruction  EXAM: PORTABLE ABDOMEN - 1 VIEW  COMPARISON:  Radiographs and CT 03/18/2015  FINDINGS: Nasogastric tube extends into the distal stomach. There is contrast in the gastric lumen. There is no significant contrast in the small bowel or colon. Dilated stacked loops of small bowel persist in the mid abdomen, probably unchanged. No free air is evident on this single AP supine portable radiograph.  IMPRESSION: Enteric contrast has not reached the colon. Persistent small bowel dilatation consistent with small-bowel obstruction.   Electronically Signed   By: Ellery Plunk M.D.   On: 03/19/2015 01:48   Dg Abd Portable 1v-small Bowel Protocol-position Verification  03/18/2015   CLINICAL DATA:  Small bowel protocol, nasogastric tube placement  EXAM: PORTABLE ABDOMEN - 1 VIEW  COMPARISON:  Portable exam 1303 hours compared to CT abdomen and pelvis 03/18/2015  FINDINGS: Tip of nasogastric tube projects over distal gastric antrum or duodenal bulb, patient slightly rotated to the RIGHT.  Excreted contrast material within renal collecting systems and distended urinary bladder.  Air-filled loops of dilated small bowel noted in the mid abdomen suspicious for small bowel obstruction.  Some gas and stool remain present within the colon.  Degenerative disc and facet disease changes thoracolumbar spine.  Osseous demineralization.  IMPRESSION: Tip of nasogastric tube projects over either the distal gastric antrum or the duodenal bulb region.  Dilated small bowel loops question small bowel obstruction.   Electronically Signed   By: Ulyses Southward M.D.   On: 03/18/2015 13:16   Dg Foot 2 Views Right  03/22/2015   CLINICAL DATA:  Dorsal right foot pain for 4 days, no known injury, initial encounter  EXAM: RIGHT FOOT - 2 VIEW   COMPARISON:  None.  FINDINGS: Degenerative changes are noted in the tarsal bones. No acute fracture or dislocation is noted. A small calcaneal spur is noted.  IMPRESSION: Degenerative change without acute abnormality.   Electronically Signed   By: Alcide Clever M.D.   On: 03/22/2015 14:57   Dg Swallowing Func-speech Pathology  04/01/2015    Objective Swallowing Evaluation:    Patient Details  Name: SYNETHIA ENDICOTT MRN: [id number] Date of Birth: Jan 02, 1933  Today's Date: 04/01/2015 Time: SLP Start Time (ACUTE ONLY): 0835-SLP Stop Time (ACUTE ONLY): 0857 SLP Time Calculation (min) (ACUTE ONLY): 22 min  Past Medical History:  Past Medical History  Diagnosis Date  . CAD (coronary artery disease)     s/p stenting of LAD 1999- cath 5-08 EF normal LAD 30-40% restenosis. D1  50% D2 80% LCX & RCA minimal plaque  . HTN (hypertension)   . Hyperlipemia   . Anemia     iron deficiency  . Depression   . DVT (deep venous thrombosis)   . Gout   . Osteoporosis   . Pancreatitis   . GERD (gastroesophageal reflux disease)   . Renal insufficiency     Cr 1.2-1.3  . Obesity   . Diabetes mellitus   . Polyarthritis     DJD/ possible PMR  . Vitamin D deficiency   . B12 deficiency   . Tinnitus   . Anxiety   . Aneurysm, thoracic aortic   . Constipation   . Urinary frequency   . Vertigo   . Chronic back pain    Past Surgical History:  Past Surgical History  Procedure Laterality Date  . Hemorrhoid surgery    . Abdominal hysterectomy    . Cholecystectomy    . Tubal ligation    . Coronary angioplasty with stent placement  1999    LAD stent  . Cardiac catheterization  2008    L main 20%, LAD stent patent, D1 50%, D2 80% (small), RCA 20%, EF 55-60%   . Laparoscopy N/A 03/24/2015    Procedure: LAPAROSCOPY DIAGNOSTIC;  Surgeon: Luretha Murphy, MD;   Location: [facility];  Service: General;  Laterality: N/A;  . Laparoscopic lysis of adhesions N/A 03/24/2015    Procedure: LAPAROSCOPIC LYSIS OF ADHESIONS;  Surgeon: Luretha Murphy,  MD;  Location: [facility];   Service: General;  Laterality: N/A;  . Laparotomy N/A 03/24/2015    Procedure: LAPAROTOMY with decompression of bowel;  Surgeon: Luretha Murphy, MD;  Location: [facility];  Service: General;  Laterality:  N/A;   HPI:  Other Pertinent Information: Pt is a 81 year old female admitted 03/18/15  with SBO secondary to adhesions. She failed conservative therapy and  underwent laparoscopic lysis of adhesions/laparotomy with bowel  decompression on 03/24/15. Her hospital course was complicated by sepsis  secondary to peritoneal contamination with transfer to the SDU on 03/25/15.  She also developed left-sided facial droop/right arm weakness 03/27/15 with  CT scan showing a 4 mm lacunar infarct in the left lentiform nucleus. MRI  failed to show any abnormality. After removal of NG for suction, MD  ordered MBS prior to initiating diet.   No Data Recorded  Assessment / Plan / Recommendation CHL IP CLINICAL IMPRESSIONS 04/01/2015  Therapy Diagnosis Mild pharyngeal phase dysphagia;Mild oral phase  dysphagia  Clinical Impression Mild oral dysphagia characterized by decreased lingual  formation of bolus, improved with straw sips and improving progressively  during exam. oropharyngeal dysphagia also mild with no penetration or  aspiration. Suspect mild standing secretions intially during exam as  pharyngeal residuals decreased and timing of swallow improved with each  trial. Though swallow was initially delayed, by end of exam pt taking  large consecutive sips of thin with minimal delay and minimal residuals.  Recommend pt initiate a dys 3 (mechanical soft) diet with basic aspiration  precautions of upright posture and occasional second swallow to clear oral  cavity and orophrayngeal residuals. SLP will f/u for tolerance during  acute stay.       CHL IP TREATMENT RECOMMENDATION 04/01/2015  Treatment Recommendations Therapy as outlined in treatment plan below     CHL IP DIET RECOMMENDATION 04/01/2015  SLP Diet Recommendations Dysphagia 3 (Mech  soft);Thin  Liquid Administration via (None)  Medication Administration Whole meds with liquid  Compensations Multiple dry swallows after each bite/sip  Postural Changes and/or Swallow Maneuvers (None)     CHL IP OTHER RECOMMENDATIONS 04/01/2015  Recommended Consults (None)  Oral Care Recommendations Oral care BID  Other Recommendations (None)     CHL IP FOLLOW UP RECOMMENDATIONS 03/29/2015  Follow up Recommendations 24 hour supervision/assistance     CHL IP FREQUENCY AND DURATION 04/01/2015  Speech Therapy Frequency (ACUTE ONLY) min 2x/week  Treatment Duration 2 weeks     Pertinent Vitals/Pain NA    SLP Swallow Goals No flowsheet data found.  No flowsheet data found.    CHL IP REASON FOR REFERRAL 04/01/2015  Reason for Referral Objectively evaluate swallowing function     CHL IP ORAL PHASE 04/01/2015  Lips (None)  Tongue (None)  Mucous membranes (None)  Nutritional status (None)  Other (None)  Oxygen therapy (None)  Oral Phase Impaired  Oral - Pudding Teaspoon (None)  Oral - Pudding Cup (None)  Oral - Honey Teaspoon (None)  Oral - Honey Cup (None)  Oral - Honey Syringe (None)  Oral - Nectar Teaspoon (None)  Oral - Nectar Cup (None)  Oral - Nectar Straw (None)  Oral - Nectar Syringe (None)  Oral - Ice Chips (None)  Oral - Thin Teaspoon (None)  Oral - Thin Cup (None)  Oral - Thin Straw (None)  Oral - Thin Syringe (None)  Oral - Puree (None)  Oral - Mechanical Soft (None)  Oral - Regular (None)  Oral - Multi-consistency (None)  Oral - Pill (None)  Oral Phase - Comment (None)      CHL IP PHARYNGEAL PHASE 04/01/2015  Pharyngeal Phase Impaired  Pharyngeal - Pudding Teaspoon (None)  Penetration/Aspiration details (pudding teaspoon) (None)  Pharyngeal - Pudding Cup (None)  Penetration/Aspiration details (pudding cup) (None)  Pharyngeal - Honey Teaspoon (None)  Penetration/Aspiration details (honey teaspoon) (None)  Pharyngeal - Honey Cup (None)  Penetration/Aspiration details (honey cup) (None)  Pharyngeal - Honey Syringe (None)   Penetration/Aspiration details (honey syringe) (None)  Pharyngeal - Nectar Teaspoon (None)  Penetration/Aspiration details (nectar teaspoon) (None)  Pharyngeal - Nectar Cup (None)  Penetration/Aspiration details (nectar cup) (None)  Pharyngeal - Nectar Straw (None)  Penetration/Aspiration details (nectar straw) (None)  Pharyngeal - Nectar Syringe (None)  Penetration/Aspiration details (nectar syringe) (None)  Pharyngeal - Ice Chips (None)  Penetration/Aspiration details (ice chips) (None)  Pharyngeal - Thin Teaspoon (None)  Penetration/Aspiration details (thin teaspoon) (None)  Pharyngeal - Thin Cup (None)  Penetration/Aspiration details (thin cup) (None)  Pharyngeal - Thin Straw (None)  Penetration/Aspiration details (thin straw) (None)  Pharyngeal - Thin Syringe (None)  Penetration/Aspiration details (thin syringe') (None)  Pharyngeal - Puree (None)  Penetration/Aspiration details (puree) (None)  Pharyngeal - Mechanical Soft (None)  Penetration/Aspiration details (mechanical soft) (None)  Pharyngeal - Regular (None)  Penetration/Aspiration details (regular) (None)  Pharyngeal - Multi-consistency (None)  Penetration/Aspiration details (multi-consistency) (None)  Pharyngeal - Pill (None)  Penetration/Aspiration details (pill) (None)  Pharyngeal Comment (None)      No flowsheet data found.  No flowsheet data found.        Harlon Ditty, [place] CCC-SLP [id number]  Claudine Mouton 04/01/2015, 10:31 AM    Ct Image Guided Drainage Percut Cath  Peritoneal Retroperit  04/04/2015   CLINICAL DATA:  81 year old with postoperative fluid collections. Concern for intra-abdominal abscesses.  EXAM: CT-GUIDED DRAIN PLACEMENT IN LEFT UPPER ABDOMINAL FLUID COLLECTION  CT-GUIDED DRAIN PLACEMENT IN LEFT LATERAL ABDOMINAL FLUID COLLECTION  Physician: Rachelle Hora. Lowella Dandy, MD  FLUOROSCOPY TIME:  None  MEDICATIONS: 4 mg versed, 100 mcg of fentanyl. A radiology nurse monitored the patient for moderate sedation.  ANESTHESIA/SEDATION:  Moderate sedation time: 50 min  PROCEDURE: Informed consent was obtained for CT-guided drain placements. Patient was placed supine on the CT scanner. Images through the abdomen were obtained. The collections in the left upper abdomen and lateral left abdomen were identified. The left side of the abdomen was prepped and draped in sterile fashion. The collection in the left upper abdomen and perisplenic region was initially targeted. Skin was anesthetized with 1% lidocaine. 18 gauge needle was directed into the fluid collection just posterior to the spleen and cloudy yellow fluid was aspirated. A stiff Amplatz wire was placed and the tract was dilated to accommodate a 10.2 [place] multipurpose drain. Catheter sutured to skin and attached to a suction bulb.  Attention was directed to the left lateral abdominal fluid collection. The left lateral abdomen was anesthetized with 1% lidocaine. 18 gauge needle directed into the fluid collection and cloudy yellow fluid was aspirated. Stiff Amplatz wire was placed and the tract was dilated to accommodate a 10.2 [place] multipurpose drain. Catheter sutured to skin and attached to a suction bulb.  FINDINGS: Left upper abdominal fluid collection in the perisplenic region. Drain was successfully placed just posterior to the spleen. There is a second collection in the lateral left mid abdomen. Drain successfully placed in this collection.  Estimated blood loss: Minimal  COMPLICATIONS: None  IMPRESSION: Placement of CT-guided drainage catheters in two left abdominal fluid collections. Cloudy yellow fluid was removed from both collections. Samples were sent for culture.   Electronically Signed   By: Richarda Overlie M.D.   On: 04/04/2015 08:32   Ct Image Guided Drainage Percut Cath  Peritoneal  Retroperit  04/04/2015   CLINICAL DATA:  81 year old with postoperative fluid collections. Concern for intra-abdominal abscesses.  EXAM: CT-GUIDED DRAIN PLACEMENT IN LEFT UPPER ABDOMINAL FLUID  COLLECTION  CT-GUIDED DRAIN PLACEMENT IN LEFT LATERAL ABDOMINAL FLUID COLLECTION  Physician: Rachelle Hora. Lowella Dandy, MD  FLUOROSCOPY TIME:  None  MEDICATIONS: 4 mg versed, 100 mcg of fentanyl. A radiology nurse monitored the patient for moderate sedation.  ANESTHESIA/SEDATION: Moderate sedation time: 50 min  PROCEDURE: Informed consent was obtained for CT-guided drain placements. Patient was placed supine on the CT scanner. Images through the abdomen were obtained. The collections in the left upper abdomen and lateral left abdomen were identified. The left side of the abdomen was prepped and draped in sterile fashion. The collection in the left upper abdomen and perisplenic region was initially targeted. Skin was anesthetized with 1% lidocaine. 18 gauge needle was directed into the fluid collection just posterior to the spleen and cloudy yellow fluid was aspirated. A stiff Amplatz wire was placed and the tract was dilated to accommodate a 10.2 [place] multipurpose drain. Catheter sutured to skin and attached to a suction bulb.  Attention was directed to the left lateral abdominal fluid collection. The left lateral abdomen was anesthetized with 1% lidocaine. 18 gauge needle directed into the fluid collection and cloudy yellow fluid was aspirated. Stiff Amplatz wire was placed and the tract was dilated to accommodate a 10.2 [place] multipurpose drain. Catheter sutured to skin and attached to a suction bulb.  FINDINGS: Left upper abdominal fluid collection in the perisplenic region. Drain was successfully placed just posterior to the spleen. There is a second collection in the lateral left mid abdomen. Drain successfully placed in this collection.  Estimated blood loss: Minimal  COMPLICATIONS: None  IMPRESSION: Placement of CT-guided drainage catheters in two left abdominal fluid collections. Cloudy yellow fluid was removed from both collections. Samples were sent for culture.   Electronically Signed   By: Richarda Overlie M.D.   On:  04/04/2015 08:32     Medical Consultants:    Surgery - Dr. Glenna Fellows   Cardiology   PCCM  Neurology - Dr. Thana Farr   Anti-Infectives:    Mefoxin 03/25/2015 --> 03/26/2015  Rocephin 03/26/2015 -->   Flagyl 03/26/2015 -->  Subjective:   [name] is still having a lot of abdominal pain which has impeded her ability to participate in physical therapy.  No nausea or vomiting.  Remains weak. Still does not have an appetite.  Objective:    Filed Vitals:   04/04/15 1036 04/04/15 1300 04/04/15 2057 04/05/15 0442  BP: 154/73 155/75 150/63 153/68  Pulse: 106 95 94 95  Temp: 98.6 F (37 C) 98.2 F (36.8 C) 98 F (36.7 C) 98.2 F (36.8 C)  TempSrc: Oral Oral Oral Oral  Resp: 18 18 18 18   Height:      Weight:      SpO2: 100% 100% 100% 99%    Intake/Output Summary (Last 24 hours) at 04/05/15 0811 Last data filed at 04/05/15 0445  Gross per 24 hour  Intake 1369.66 ml  Output   2914 ml  Net -1544.34 ml    Exam: Gen:  NAD Cardiovascular:  RRR, No M/R/G Respiratory:  Lungs CTAB Gastrointestinal:  Abdomen softly distended, tender, + BS Extremities:  1+ edema   Data Reviewed:    Labs: Basic Metabolic Panel:  Recent Labs Lab 03/31/15 0350 04/01/15 0450 04/02/15 0537  04/03/15 0545 04/04/15 0650  NA 136 135 135  --  132* 132*  K 4.8 5.1 5.8*  < > 4.9 4.7  CL 102 104 103  --  100* 103  CO2 25 24 25   --  24 21*  GLUCOSE 176* 185* 163*  --  195* 174*  BUN 48* 53* 48*  --  45* 46*  CREATININE 0.98 0.89 0.80  --  0.75 0.83  CALCIUM 8.2* 8.0* 8.5*  --  8.3* 8.2*  MG 1.7 1.8 1.7  --   --  1.5*  PHOS 4.5 4.7* 4.6  --   --  3.8  < > = values in this interval not displayed. GFR Estimated Creatinine Clearance: 52 mL/min (by C-G formula based on Cr of 0.83). Liver Function Tests:  Recent Labs Lab 03/31/15 0350 04/04/15 0650  AST 26 45*  ALT 14 26  ALKPHOS 98 139*  BILITOT 0.4 0.5  PROT 5.9* 6.8  ALBUMIN 1.8* 1.9*   Coagulation  profile  Recent Labs Lab 04/03/15 1140  INR 1.22    CBC:  Recent Labs Lab 04/01/15 0450 04/02/15 0537 04/03/15 0545 04/04/15 0650  WBC 14.8* 16.9* 16.9* 16.7*  NEUTROABS  --   --   --  15.3*  HGB 6.4* 9.1* 9.0* 8.8*  HCT 19.5* 28.7* 29.3* 28.3*  MCV 89.9 82.0 81.6 83.2  PLT 297 364 370 465*   Cardiac Enzymes: No results for input(s): CKTOTAL, CKMB, CKMBINDEX, TROPONINI in the last 168 hours. CBG:  Recent Labs Lab 04/04/15 1745 04/04/15 2013 04/05/15 0105 04/05/15 0441 04/05/15 0737  GLUCAP 158* 125* 110* 156* 103*   Sepsis Labs:  Recent Labs Lab 04/01/15 0450 04/02/15 0537 04/03/15 0545 04/04/15 0650  WBC 14.8* 16.9* 16.9* 16.7*   Microbiology Recent Results (from the past 240 hour(s))  Culture, routine-abscess     Status: None (Preliminary result)   Collection Time: 04/03/15  3:18 PM  Result Value Ref Range Status   Specimen Description ABSCESS LEFT ABDOMEN UPPER  Final   Special Requests NONE  Final   Gram Stain   Final    MODERATE WBC PRESENT,BOTH PMN AND MONONUCLEAR NO SQUAMOUS EPITHELIAL CELLS SEEN NO ORGANISMS SEEN Performed at [facility]    Culture   Final    NO GROWTH 1 DAY Performed at [facility]    Report Status PENDING  Incomplete  Culture, routine-abscess     Status: None (Preliminary result)   Collection Time: 04/03/15  3:18 PM  Result Value Ref Range Status   Specimen Description ABSCESS ABDOMEN MID  Final   Special Requests NONE  Final   Gram Stain   Final    ABUNDANT WBC PRESENT,BOTH PMN AND MONONUCLEAR NO SQUAMOUS EPITHELIAL CELLS SEEN NO ORGANISMS SEEN Performed at [facility]   Final    Culture reincubated for better growth Performed at [facility]    Report Status PENDING  Incomplete     Medications:   . antiseptic oral rinse  7 mL Mouth Rinse q12n4p  . aspirin  300 mg Rectal Daily   Or  . aspirin  325 mg Oral Daily  . bisacodyl  10 mg Rectal Daily  .  chlorhexidine  15 mL Mouth Rinse BID  . feeding supplement (ENSURE ENLIVE)  237 mL Oral BID BM  . insulin aspart  0-15 Units Subcutaneous 6 times per day  . lip balm  1 application Topical BID  . metoprolol  2.5 mg Intravenous 4 times per day   Continuous Infusions: . sodium chloride 10  mL/hr (03/29/15 0800)  . TPN (CLINIMIX) Adult without lytes 50 mL/hr at 04/04/15 1725   And  . fat emulsion 240 mL (04/04/15 1725)    Time spent: 25 minutes.   LOS: 18 days   RAMA,CHRISTINA  [facility] Pager [phone]. If unable to reach me by pager, please call my cell phone at [phone].  *Please refer to [email or web address], password [email or web address] to get updated schedule on who will round on this patient, as hospitalists switch teams weekly. If 7PM-7AM, please contact night-coverage at [email or web address], password [email or web address] for any overnight needs.  04/05/2015, 8:11 AM

## 2015-04-05 NOTE — Care Management Note (Signed)
Case Management Note  Patient Details  Name: [name] MRN: [id number] Date of Birth: May 22, 1933  Subjective/Objective:                    Action/Plan:   Expected Discharge Date:   (unknown)               Expected Discharge Plan:  Skilled Nursing Facility  In-House Referral:  Clinical Social Work  Discharge planning Services  CM Consult  Post Acute Care Choice:    Choice offered to:     DME Arranged:    DME Agency:     HH Arranged:    HH Agency:     Status of Service:  In process, will continue to follow  Medicare Important Message Given:  Yes Date Medicare IM Given:  03/21/15 Medicare IM give by:  Aileen Pilot Date Additional Medicare IM Given:  04/05/15 Additional Medicare Important Message give by:  Geni Bers, RN  If discussed at Long Length of Stay Meetings, dates discussed:    Additional Comments: Pt continue to complain of pain. Not eating. Plan to dc to SNF.   Geni Bers, RN 04/05/2015, 2:10 PM

## 2015-04-06 ENCOUNTER — Inpatient Hospital Stay (HOSPITAL_COMMUNITY): Payer: Commercial Managed Care - HMO

## 2015-04-06 LAB — CBC
HCT: 27.4 % — ABNORMAL LOW (ref 36.0–46.0)
Hemoglobin: 8.5 g/dL — ABNORMAL LOW (ref 12.0–15.0)
MCH: 25.7 pg — ABNORMAL LOW (ref 26.0–34.0)
MCHC: 31 g/dL (ref 30.0–36.0)
MCV: 82.8 fL (ref 78.0–100.0)
PLATELETS: 530 10*3/uL — AB (ref 150–400)
RBC: 3.31 MIL/uL — ABNORMAL LOW (ref 3.87–5.11)
RDW: 22.5 % — ABNORMAL HIGH (ref 11.5–15.5)
WBC: 14.3 10*3/uL — ABNORMAL HIGH (ref 4.0–10.5)

## 2015-04-06 LAB — URINALYSIS, ROUTINE W REFLEX MICROSCOPIC
Bilirubin Urine: NEGATIVE
Glucose, UA: NEGATIVE mg/dL
HGB URINE DIPSTICK: NEGATIVE
KETONES UR: NEGATIVE mg/dL
Nitrite: NEGATIVE
Protein, ur: NEGATIVE mg/dL
Specific Gravity, Urine: 1.015 (ref 1.005–1.030)
Urobilinogen, UA: 0.2 mg/dL (ref 0.0–1.0)
pH: 5.5 (ref 5.0–8.0)

## 2015-04-06 LAB — BASIC METABOLIC PANEL
ANION GAP: 8 (ref 5–15)
BUN: 47 mg/dL — AB (ref 6–20)
CHLORIDE: 102 mmol/L (ref 101–111)
CO2: 22 mmol/L (ref 22–32)
CREATININE: 0.74 mg/dL (ref 0.44–1.00)
Calcium: 8.7 mg/dL — ABNORMAL LOW (ref 8.9–10.3)
GFR calc Af Amer: >60 mL/min (ref >60)
GFR calc non Af Amer: >60 mL/min (ref >60)
Glucose, Bld: 153 mg/dL — ABNORMAL HIGH (ref 65–99)
Potassium: 4.8 mmol/L (ref 3.5–5.1)
Sodium: 132 mmol/L — ABNORMAL LOW (ref 135–145)

## 2015-04-06 LAB — GLUCOSE, CAPILLARY
GLUCOSE-CAPILLARY: 117 mg/dL — AB (ref 65–99)
GLUCOSE-CAPILLARY: 154 mg/dL — AB (ref 65–99)
Glucose-Capillary: 165 mg/dL — ABNORMAL HIGH (ref 65–99)
Glucose-Capillary: 152 mg/dL — ABNORMAL HIGH (ref 65–99)
Glucose-Capillary: 144 mg/dL — ABNORMAL HIGH (ref 65–99)
Glucose-Capillary: 156 mg/dL — ABNORMAL HIGH (ref 65–99)

## 2015-04-06 LAB — URINE MICROSCOPIC-ADD ON

## 2015-04-06 MED ORDER — FAT EMULSION 20 % IV EMUL
240.0000 mL | INTRAVENOUS | Status: AC
  Administered 2015-04-06: 240 mL via INTRAVENOUS
  Filled 2015-04-06: qty 250

## 2015-04-06 MED ORDER — TRACE MINERALS CR-CU-MN-SE-ZN 10-1000-500-60 MCG/ML IV SOLN
INTRAVENOUS | Status: AC
  Administered 2015-04-06: 18:00:00 via INTRAVENOUS
  Filled 2015-04-06: qty 960

## 2015-04-06 NOTE — Progress Notes (Signed)
Progress Note   [name]:[id number] DOB: Dec 12, 1932 DOA: 03/18/2015 PCP: [name], MD   Brief Narrative:   [name] is an 81 y.o. female with a PMH of hypertension, CAD, hyperlipidemia and diabetes who was admitted 03/18/15 with SBO secondary to adhesions. She failed conservative therapy and underwent laparoscopic lysis of adhesions/laparotomy with bowel decompression on 03/24/15. Her hospital course was complicated by sepsis secondary to peritoneal contamination with transfer to the SDU on 03/25/15. She also developed left-sided facial droop/right arm weakness 03/27/15 with CT scan showing a 4 mm lacunar infarct in the left lentiform nucleus. MRI failed to show any abnormality. Due to failure to improve an ongoing high need for pain control, a repeat CT scan was done 04/02/15 which showed 2 large fluid collections. She underwent percutaneous drainage with placement of abscess drains by IR 04/04/15. Patient continues to have pain and reports no BM on 5/25. Had a fever of 101 earlier this am.   Assessment/Plan:   Principal Problem:   Small bowel obstruction s/p exlap/LOA/decompression 03/25/2015 complicated by severe sepsis secondary to peritonitis / abdominal pain now complicated by intra-abdominal abscess/fluid collection - Status post laparoscopic lysis of adhesions/laparotomy with bowel decompression 03/24/15. - Subsequently developed acute peritonitis/sepsis. - Initially treated with Mefoxin starting 03/25/15. Antibiotics switched to Flagyl/Rocephin 03/26/15.   - CT scan of the abdomen/pelvis done 04/02/15 which showed 2 large fluid collections.  - 2 percutaneous drains placed by IR 04/03/15 to drain fluid collections. Follow-up abscess cultures. - Plan to repeat CT scan once drainage subsides.  - fever today, blood cultures done and CXR showed worsening atelectasis. If continues to spike fevers. Plan to repeat CT tomorrow.   Active Problems:   Facial droop / right arm  weakness / possible acute CVA left lentiform nucleus - On 03/27/15, CT of the head done to evaluate symptoms showed an acute 4 mm lacunar infarct in the left lentiform nucleus. - Subsequent MRI negative, although clinical findings consistent with acute CVA. MRA negative for stenosis. - 2-D echo with normal EF, no diastolic dysfunction noted. No intracardiac masses or thrombi. - Carotid Dopplers negative for significant stenosis (1-39% bilaterally). - Hemoglobin A1c 6.3%. Cholesterol 99. - Evaluated by neurology, no further stroke workup recommended. - Continue aspirin. - PT/OT.    Hypokalemia/hypomagnesemia - Monitor and replace electrolytes as needed.    Acute postoperative blood loss anemia - Status post 2 units of PRBCs 04/01/15. - Continue to monitor hemoglobin and transfuse for hemoglobin less than 7.    Diabetes type 2, controlled - Currently being managed with moderate scale SSI every 6 hours. CBGs 103-156. - Hemoglobin A1c 6.3%.    Anxiety state/Adjustment disorder with mixed anxiety and depressed mood - Mood has been depressed with limited motivation to participate in PT.    Coronary atherosclerosis - Continue aspirin therapy.    Acute renal failure secondary to sepsis  - Resolved with IV fluids.    Right foot pain  - Radiographs negative for fracture. Degenerative changes noted.    Polymyalgia rheumatica/Chronic fatigue disorder - PT/OT.    Elevated blood pressure - Continue as needed Lopressor.    Malnutrition of moderate degree - Continue TPN for nutritional support. Begin to wean.    Acute delirium - Avoid sedating medications. Family requests patient does not get Ativan or Haldol.    Thrombocytopenia - Mild. Likely related to recent sepsis.    DVT Prophylaxis - Continue SCDs.  Code Status: Full. Family Communication: no family  present today.   Disposition Plan: Home with daughter when diet advanced,.   IV Access:    PICC placed  03/23/15   Procedures and diagnostic studies:   Dg Chest 2 View  04/06/2015   CLINICAL DATA:  Fever  EXAM: CHEST  2 VIEW  COMPARISON:  03/27/2015  FINDINGS: Cardiac shadow is mildly enlarged. Left basilar atelectasis is again identified. Two recently placed drainage catheters are noted in the left upper abdomen. Mild right basilar atelectasis is noted. A left-sided PICC line is noted with the tip at the junction of the innominate veins. This has withdrawn slightly in the interval from the prior exam. No acute bony abnormality is seen.  IMPRESSION: Bibasilar atelectasis. This has increased in the interval from the prior exam.  Left upper quadrant drainage catheters.   Electronically Signed   By: Alcide Clever M.D.   On: 04/06/2015 14:57   Ct Head Wo Contrast  03/27/2015   CLINICAL DATA:  RIGHT-sided facial droop. Symptoms began earlier today. Altered mental status.  EXAM: CT HEAD WITHOUT CONTRAST  TECHNIQUE: Contiguous axial images were obtained from the base of the skull through the vertex without intravenous contrast.  COMPARISON:  05/27/2014.  FINDINGS: Possible acute 4 mm lacune, LEFT lentiform nucleus as seen on image 12 series 5. This was not clearly present on in 2015 scan.  No other areas of concern for cortical or subcortical infarction.  No hemorrhage, mass lesion, hydrocephalus, or extra-axial fluid.  Cerebral and cerebellar atrophy. Generalized white matter hypoattenuation, likely small vessel disease. Than the calvarium intact. Vascular calcification. No sinus or mastoid disease. Dense lenticular opacities.  IMPRESSION: Possible acute 4 mm lacunar infarct LEFT lentiform nucleus. If further investigation desired, and no contraindications, consider MRI brain.   Electronically Signed   By: Davonna Belling M.D.   On: 03/27/2015 16:25   Mr Maxine Glenn Head Wo Contrast  03/28/2015   CLINICAL DATA:  81 year old with right-sided facial droop. Altered mental status. Subsequent encounter.  EXAM: MRI HEAD WITHOUT  CONTRAST  MRA HEAD WITHOUT CONTRAST  TECHNIQUE: Multiplanar, multiecho pulse sequences of the brain and surrounding structures were obtained without intravenous contrast. Angiographic images of the head were obtained using MRA technique without contrast.  COMPARISON:  03/27/2015 head CT.  FINDINGS: MRI HEAD FINDINGS  No acute infarct.  No intracranial hemorrhage.  Remote tiny infarct right cerebellum. Minimal small vessel disease type changes.  Incidentally noted is a prominent peri vascular space left lenticular nucleus.  Global atrophy without hydrocephalus.  No intracranial mass lesion noted on this unenhanced exam.  Cervical medullary junction unremarkable. Mild spinal stenosis C3-4.  Expanded partially empty sella without other findings of pseudotumor cerebri  Pineal region unremarkable.  No intracranial mass lesion noted on this unenhanced exam.  MRA HEAD FINDINGS  Mild narrowing pre cavernous/ cavernous segment right internal carotid artery.  Mild ectasia left internal carotid artery cavernous segment. The minimal bulge along the left internal carotid artery cavernous segment most likely related to slight ectasia of the left ophthalmic artery origin rather than small aneurysm. Mild motion degradation limits evaluation.  Slightly ectatic anterior communicating artery without aneurysm.  Middle cerebral artery mild branch vessel irregularity bilaterally.  No significant stenosis distal vertebral arteries or basilar artery. Regions of mild irregularity and slight narrowing.  IMPRESSION: MRI HEAD  No acute infarct.  Remote tiny infarct right cerebellum.  Incidentally noted is a prominent peri vascular space left lenticular nucleus.  Global atrophy without hydrocephalus.  Expanded partially empty sella without other  findings of pseudotumor cerebri  MRA HEAD FINDINGS  No medium or large size vessel significant stenosis or occlusion. Evaluation of branch vessels slightly limited by motion degradation.    Electronically Signed   By: Lacy Duverney M.D.   On: 03/28/2015 12:02   Mr Brain Wo Contrast  03/28/2015   CLINICAL DATA:  81 year old with right-sided facial droop. Altered mental status. Subsequent encounter.  EXAM: MRI HEAD WITHOUT CONTRAST  MRA HEAD WITHOUT CONTRAST  TECHNIQUE: Multiplanar, multiecho pulse sequences of the brain and surrounding structures were obtained without intravenous contrast. Angiographic images of the head were obtained using MRA technique without contrast.  COMPARISON:  03/27/2015 head CT.  FINDINGS: MRI HEAD FINDINGS  No acute infarct.  No intracranial hemorrhage.  Remote tiny infarct right cerebellum. Minimal small vessel disease type changes.  Incidentally noted is a prominent peri vascular space left lenticular nucleus.  Global atrophy without hydrocephalus.  No intracranial mass lesion noted on this unenhanced exam.  Cervical medullary junction unremarkable. Mild spinal stenosis C3-4.  Expanded partially empty sella without other findings of pseudotumor cerebri  Pineal region unremarkable.  No intracranial mass lesion noted on this unenhanced exam.  MRA HEAD FINDINGS  Mild narrowing pre cavernous/ cavernous segment right internal carotid artery.  Mild ectasia left internal carotid artery cavernous segment. The minimal bulge along the left internal carotid artery cavernous segment most likely related to slight ectasia of the left ophthalmic artery origin rather than small aneurysm. Mild motion degradation limits evaluation.  Slightly ectatic anterior communicating artery without aneurysm.  Middle cerebral artery mild branch vessel irregularity bilaterally.  No significant stenosis distal vertebral arteries or basilar artery. Regions of mild irregularity and slight narrowing.  IMPRESSION: MRI HEAD  No acute infarct.  Remote tiny infarct right cerebellum.  Incidentally noted is a prominent peri vascular space left lenticular nucleus.  Global atrophy without hydrocephalus.  Expanded  partially empty sella without other findings of pseudotumor cerebri  MRA HEAD FINDINGS  No medium or large size vessel significant stenosis or occlusion. Evaluation of branch vessels slightly limited by motion degradation.   Electronically Signed   By: Lacy Duverney M.D.   On: 03/28/2015 12:02   Ct Abdomen Pelvis W Contrast  04/02/2015   CLINICAL DATA:  Nonspecific abdominal pain. Status post recent laparotomy for small bowel obstruction.  EXAM: CT ABDOMEN AND PELVIS WITH CONTRAST  TECHNIQUE: Multidetector CT imaging of the abdomen and pelvis was performed using the standard protocol following bolus administration of intravenous contrast.  CONTRAST:  69mL OMNIPAQUE IOHEXOL 300 MG/ML SOLN, OMNIPAQUE IOHEXOL 300 MG/ML SOLN  COMPARISON:  Renal ultrasound dated 03/25/2015 and abdomen and pelvis CT dated 03/18/2015.  FINDINGS: Cholecystectomy clips. Interval midline surgical scar in the skin clips at the level of the pelvis. Loculated fluid with peripheral rim enhancement beneath the left hemidiaphragm and partially surrounding the spleen. This measures 7.7 x 5.3 cm on axial image 16. This measures 7.7 cm in length on coronal image number 92.  There is an additional fluid collection in the lateral aspect of the left mid abdomen, measuring 8.3 x 4.2 cm on axial image number 31. This also has peripheral rim enhancement. This measures 9.8 cm in length on coronal image number 64.  There are dilated small bowel loops in the lower abdomen and upper pelvis. Above the urinary bladder, one of these loops contains fluid, small amount of oral contrast and some gas. No definite separate fluid collection is seen.  A Foley catheter is in the  urinary bladder with associated air in the bladder. No free peritoneal air seen. Atheromatous arterial calcifications, including the coronary arteries. Cholecystectomy clips.  Small left pleural effusion. Bilateral lower lobe atelectasis. Multiple colonic diverticula. Small left inguinal  hernia containing fat. Diffuse subcutaneous edema. Lumbar and lower thoracic spine degenerative changes and scoliosis.  IMPRESSION: 1. 7.7 x 7.7 x 5.3 cm fluid collection with rim enhancement beneath the left hemidiaphragm and partially surrounding the spleen. This is suspicious for an abscess. 2. 9.8 x 8.3 x 4.2 cm arm left mid abdominal fluid collection with peripheral rim enhancement, suspicious for an abscess. 3. Focal ileus or partial obstruction involving small bowel in the inferior abdomen and upper pelvis. 4. Small left pleural effusion. 5. Bilateral lower lobe atelectasis. 6. Colonic diverticulosis.   Electronically Signed   By: Beckie Salts M.D.   On: 04/02/2015 17:38   Ct Abdomen Pelvis W Contrast  03/18/2015   CLINICAL DATA:  Nausea and vomiting for 4 days, anxiety, status post hysterectomy CT pelvis 05/27/2014  EXAM: CT ABDOMEN AND PELVIS WITH CONTRAST  TECHNIQUE: Multidetector CT imaging of the abdomen and pelvis was performed using the standard protocol following bolus administration of intravenous contrast.  CONTRAST:  30mL OMNIPAQUE IOHEXOL 300 MG/ML SOLN, OMNIPAQUE IOHEXOL 300 MG/ML SOLN  COMPARISON:  None.  FINDINGS: Sagittal images of the spine shows diffuse osteopenia. Degenerative changes thoracolumbar spine. The lung bases are unremarkable.  The patient is status post cholecystectomy. Mild intrahepatic biliary ductal dilatation. There is CBD dilatation up to 1.1 cm. No focal hepatic mass. The pancreas, spleen and adrenal glands are unremarkable. Kidneys are symmetrical in size and enhancement. No hydronephrosis or hydroureter. Delayed renal images shows bilateral renal symmetrical excretion.  Atherosclerotic calcifications of abdominal aorta and iliac arteries. Moderate stool noted in right colon and proximal transverse colon. No colonic obstruction. The descending colon and sigmoid colon are empty collapsed. Some stool noted within rectum.  There are fluid distended small bowel loops  in mid upper abdomen and left lower abdomen and pelvis. Small amount of fluid/stranding noted a adjacent to small bowel loops in left lower quadrant see axial image 57. Distal small bowel is decreased caliber collapsed. Findings are consistent with small bowel obstruction.  Mild distended urinary bladder. The patient is status post hysterectomy. The terminal ileum is small caliber decompressed. Contrast material is noted within stomach. No any contrast material is noted within small bowel or colon.  IMPRESSION: 1. There are fluid distended small bowel loops with multiple air-fluid levels highly suspicious for small bowel obstruction. Distal small bowel is small caliber decompressed. Terminal ileum is small caliber. Small amount of fluid/stranding noted adjacent to small bowel loops in left lower quadrant please see images 49 and 57. 2. Moderate stool noted in right colon and proximal transverse colon. Descending colon and sigmoid colon are empty collapsed. 3. No pericecal inflammation. 4. No hydronephrosis or hydroureter. 5. Status post cholecystectomy. Mild intrahepatic biliary ductal dilatation. CBD dilatation up to 1.1 cm. 6. No hydronephrosis or hydroureter. 7. Status post hysterectomy. These results were called by telephone at the time of interpretation on 03/18/2015 at 8:59 am to Dr. Gerhard Munch , who verbally acknowledged these results.   Electronically Signed   By: Natasha Mead M.D.   On: 03/18/2015 08:59   US Renal  03/25/2015   CLINICAL DATA:  Acute renal failure.  EXAM: RENAL / URINARY TRACT ULTRASOUND COMPLETE  COMPARISON:  None.  FINDINGS: Right Kidney:  Length: 8.1 cm. Increased echogenicity consistent  with chronic medical renal disease. No mass or hydronephrosis visualized.  Left Kidney:  Length: 9.3 cm. Increased echogenicity consistent chronic medical renal disease. No mass or hydronephrosis visualized.  Bladder:  Bladder decompressed by Foley catheter.  IMPRESSION: Bilateral echodense kidneys  consistent chronic medical renal disease. No acute abnormality. No hydronephrosis or bladder distention.   Electronically Signed   By: Maisie Fus  Register   On: 03/25/2015 13:27   Dg Chest Port 1 View  03/27/2015   CLINICAL DATA:  Left PICC placement. Nausea and vomiting. Initial encounter.  EXAM: PORTABLE CHEST - 1 VIEW  COMPARISON:  Chest radiograph performed 03/26/2015  FINDINGS: The patient's left PICC is noted ending about the proximal SVC. An enteric tube is noted extending below the diaphragm.  The lungs are hypoexpanded. Bibasilar and right upper lung zone airspace opacities may reflect atelectasis or pneumonia. No pleural effusion or pneumothorax is seen  The cardiomediastinal silhouette is borderline normal in size. No acute osseous abnormalities are identified.  IMPRESSION: 1. Left PICC noted ending about the proximal SVC. 2. Lungs hypoexpanded. Bibasilar and right upper lung zone airspace opacities may reflect atelectasis or pneumonia.  These results were called by telephone at the time of interpretation on 03/27/2015 at 7:33 pm to Nursing in the [facility], who verbally acknowledged these results.   Electronically Signed   By: Roanna Raider M.D.   On: 03/27/2015 19:34   Dg Chest Port 1 View  03/26/2015   CLINICAL DATA:  Atelectasis  EXAM: PORTABLE CHEST - 1 VIEW  COMPARISON:  Yesterday  FINDINGS: NG tube and left PICC are stable. Upper normal heart size. Low volumes. Bibasilar atelectasis is not significantly changed. No pneumothorax.  IMPRESSION: Stable bibasilar atelectasis.   Electronically Signed   By: Jolaine Click M.D.   On: 03/26/2015 08:14   Dg Chest Port 1 View  03/25/2015   CLINICAL DATA:  Tachycardia and hypotension; concern for sepsis  EXAM: PORTABLE CHEST - 1 VIEW  COMPARISON:  May 27, 2014  FINDINGS: There is patchy atelectasis in both lung bases. The degree of inspiration is shallow. There is no frank airspace consolidation. Heart is upper normal in size with pulmonary  vascularity within normal limits.  Central catheter tip is in the superior vena cava. Nasogastric tube tip and side port are in the distal stomach. No pneumothorax.  IMPRESSION: Bibasilar atelectasis. No pneumothorax. Degree of inspiration shallow.   Electronically Signed   By: Bretta Bang III M.D.   On: 03/25/2015 09:03   Dg Abd Portable 1v  03/24/2015   CLINICAL DATA:  Small-bowel obstruction  EXAM: PORTABLE ABDOMEN - 1 VIEW  COMPARISON:  03/23/2015  FINDINGS: NG tube is been placed with the tip in the gastric antrum  Small bowel dilatation shows mild interval improvement. Colon is decompressed with moderate stool in the right colon. Surgical clips in the gallbladder fossa.  IMPRESSION: Mild improvement in small bowel obstruction. NG tube in the gastric antrum.   Electronically Signed   By: Marlan Palau M.D.   On: 03/24/2015 08:25   Dg Abd Portable 1v  03/23/2015   CLINICAL DATA:  Followup small bowel obstruction.  EXAM: PORTABLE ABDOMEN - 1 VIEW  COMPARISON:  03/22/2015  FINDINGS: Dilated loops small bowel are similar to the prior exam allowing for differences in radiographic technique different degrees of magnification.  There is a small amount of air in a nondistended colon.  IMPRESSION: 1. Persistent high-grade partial small bowel obstruction. No significant change from the  previous day's study.   Electronically Signed   By: Amie Portland M.D.   On: 03/23/2015 10:01   Dg Abd Portable 1v  03/22/2015   CLINICAL DATA:  81 year old female with recent small bowel obstruction. Initial encounter.  EXAM: PORTABLE ABDOMEN - 1 VIEW  COMPARISON:  03/21/2015 and earlier, including CT Abdomen and Pelvis 03/18/2015.  FINDINGS: Portable AP supine view at 0517 hrs. Enteric tube has been removed. Recurrent dilated gas-filled small bowel loops in the mid abdomen up to 41 mm diameter. Abundant stool now at the splenic flexure. Decreased distal colon gas in the pelvis. Stable cholecystectomy clips. Stable  abdominal and pelvic visceral contours. Grossly stable lung bases. Stable visualized osseous structures.  IMPRESSION: NG tube removed with recurrent small bowel obstruction.   Electronically Signed   By: Odessa Fleming M.D.   On: 03/22/2015 07:42   Dg Abd Portable 1v  03/21/2015   CLINICAL DATA:  Small-bowel obstruction .  EXAM: PORTABLE ABDOMEN - 1 VIEW  COMPARISON:  03/20/2015 .  FINDINGS: NG tube noted with tip in the upper portion stomach. Slight advancement should be considered . Surgical clips right upper quadrant Soft tissue structures are unremarkable. Small-bowel distention has improved. No free air. Stool noted throughout the colon. No acute bony abnormality.  IMPRESSION: 1. NG tube noted with tip in the upper portion of the stomach. Advancement suggested.  2. Interim improvement of small bowel distention. Colonic gas pattern is normal with stool throughout the colon.  These results will be called to the ordering clinician or representative by the Radiologist Assistant, and communication documented in the PACS or zVision Dashboard.   Electronically Signed   By: Maisie Fus  Register   On: 03/21/2015 07:32   Dg Abd Portable 1v  03/20/2015   CLINICAL DATA:  Followup small bowel obstruction.  EXAM: PORTABLE ABDOMEN - 1 VIEW  COMPARISON:  03/19/2015  FINDINGS: Mild small bowel dilation persists most evident in the left upper quadrant. There has been no substantial change from the prior study. Residual contrast is noted in the bladder, also similar to the prior exam. Nasogastric tube tip projects in the distal stomach.  IMPRESSION: Small bowel dilation persists consistent with a persistent partial small bowel obstruction. No change from the previous day's study.   Electronically Signed   By: Amie Portland M.D.   On: 03/20/2015 07:29   Dg Abd Portable 1v  03/19/2015   CLINICAL DATA:  Small bowel obstruction protocol. Small bowel obstruction.  EXAM: PORTABLE ABDOMEN - 1 VIEW  COMPARISON:  03/18/2015.  FINDINGS: This is  a 24 hr film. There is no contrast identified in the cecum. On the prior exam at 2046 hr yesterday, oral contrast was present in the stomach. Excreted contrast is present within the urinary bladder. Dilation of small bowel loops measures 36 mm. Cholecystectomy clips are present in the right upper quadrant.  There is less small bowel contrast than expected which may be due to dilution in the proximal small bowel. Nasogastric tube remains in the stomach with the tip at the antrum.  Large amount of stool is present in the colon.  IMPRESSION: Small bowel obstruction with no contrast identified in the cecum at 24 hr.   Electronically Signed   By: Andreas Newport M.D.   On: 03/19/2015 17:11   Dg Abd Portable 1v-small Bowel Obstruction Protocol-initial, 8 Hr Delay  03/19/2015   CLINICAL DATA:  Small bowel obstruction  EXAM: PORTABLE ABDOMEN - 1 VIEW  COMPARISON:  Radiographs and CT  03/18/2015  FINDINGS: Nasogastric tube extends into the distal stomach. There is contrast in the gastric lumen. There is no significant contrast in the small bowel or colon. Dilated stacked loops of small bowel persist in the mid abdomen, probably unchanged. No free air is evident on this single AP supine portable radiograph.  IMPRESSION: Enteric contrast has not reached the colon. Persistent small bowel dilatation consistent with small-bowel obstruction.   Electronically Signed   By: Ellery Plunk M.D.   On: 03/19/2015 01:48   Dg Abd Portable 1v-small Bowel Protocol-position Verification  03/18/2015   CLINICAL DATA:  Small bowel protocol, nasogastric tube placement  EXAM: PORTABLE ABDOMEN - 1 VIEW  COMPARISON:  Portable exam 1303 hours compared to CT abdomen and pelvis 03/18/2015  FINDINGS: Tip of nasogastric tube projects over distal gastric antrum or duodenal bulb, patient slightly rotated to the RIGHT.  Excreted contrast material within renal collecting systems and distended urinary bladder.  Air-filled loops of dilated small bowel  noted in the mid abdomen suspicious for small bowel obstruction.  Some gas and stool remain present within the colon.  Degenerative disc and facet disease changes thoracolumbar spine.  Osseous demineralization.  IMPRESSION: Tip of nasogastric tube projects over either the distal gastric antrum or the duodenal bulb region.  Dilated small bowel loops question small bowel obstruction.   Electronically Signed   By: Ulyses Southward M.D.   On: 03/18/2015 13:16   Dg Foot 2 Views Right  03/22/2015   CLINICAL DATA:  Dorsal right foot pain for 4 days, no known injury, initial encounter  EXAM: RIGHT FOOT - 2 VIEW  COMPARISON:  None.  FINDINGS: Degenerative changes are noted in the tarsal bones. No acute fracture or dislocation is noted. A small calcaneal spur is noted.  IMPRESSION: Degenerative change without acute abnormality.   Electronically Signed   By: Alcide Clever M.D.   On: 03/22/2015 14:57   Dg Swallowing Func-speech Pathology  04/01/2015    Objective Swallowing Evaluation:    Patient Details  Name: SOLEIL MAS MRN: [id number] Date of Birth: 02-Dec-1932  Today's Date: 04/01/2015 Time: SLP Start Time (ACUTE ONLY): 0835-SLP Stop Time (ACUTE ONLY): 0857 SLP Time Calculation (min) (ACUTE ONLY): 22 min  Past Medical History:  Past Medical History  Diagnosis Date  . CAD (coronary artery disease)     s/p stenting of LAD 1999- cath 5-08 EF normal LAD 30-40% restenosis. D1  50% D2 80% LCX & RCA minimal plaque  . HTN (hypertension)   . Hyperlipemia   . Anemia     iron deficiency  . Depression   . DVT (deep venous thrombosis)   . Gout   . Osteoporosis   . Pancreatitis   . GERD (gastroesophageal reflux disease)   . Renal insufficiency     Cr 1.2-1.3  . Obesity   . Diabetes mellitus   . Polyarthritis     DJD/ possible PMR  . Vitamin D deficiency   . B12 deficiency   . Tinnitus   . Anxiety   . Aneurysm, thoracic aortic   . Constipation   . Urinary frequency   . Vertigo   . Chronic back pain    Past Surgical History:  Past Surgical  History  Procedure Laterality Date  . Hemorrhoid surgery    . Abdominal hysterectomy    . Cholecystectomy    . Tubal ligation    . Coronary angioplasty with stent placement  1999    LAD stent  . Cardiac catheterization  2008    L main 20%, LAD stent patent, D1 50%, D2 80% (small), RCA 20%, EF 55-60%   . Laparoscopy N/A 03/24/2015    Procedure: LAPAROSCOPY DIAGNOSTIC;  Surgeon: Luretha Murphy, MD;   Location: [facility];  Service: General;  Laterality: N/A;  . Laparoscopic lysis of adhesions N/A 03/24/2015    Procedure: LAPAROSCOPIC LYSIS OF ADHESIONS;  Surgeon: Luretha Murphy,  MD;  Location: [facility];  Service: General;  Laterality: N/A;  . Laparotomy N/A 03/24/2015    Procedure: LAPAROTOMY with decompression of bowel;  Surgeon: Luretha Murphy, MD;  Location: [facility];  Service: General;  Laterality: N/A;   HPI:  Other Pertinent Information: Pt is a 81 year old female admitted 03/18/15  with SBO secondary to adhesions. She failed conservative therapy and  underwent laparoscopic lysis of adhesions/laparotomy with bowel  decompression on 03/24/15. Her hospital course was complicated by sepsis  secondary to peritoneal contamination with transfer to the SDU on 03/25/15.  She also developed left-sided facial droop/right arm weakness 03/27/15 with  CT scan showing a 4 mm lacunar infarct in the left lentiform nucleus. MRI  failed to show any abnormality. After removal of NG for suction, MD  ordered MBS prior to initiating diet.   No Data Recorded  Assessment / Plan / Recommendation CHL IP CLINICAL IMPRESSIONS 04/01/2015  Therapy Diagnosis Mild pharyngeal phase dysphagia;Mild oral phase  dysphagia  Clinical Impression Mild oral dysphagia characterized by decreased lingual  formation of bolus, improved with straw sips and improving progressively  during exam. oropharyngeal dysphagia also mild with no penetration or  aspiration. Suspect mild standing secretions intially during exam as  pharyngeal residuals decreased and timing of swallow  improved with each  trial. Though swallow was initially delayed, by end of exam pt taking  large consecutive sips of thin with minimal delay and minimal residuals.  Recommend pt initiate a dys 3 (mechanical soft) diet with basic aspiration  precautions of upright posture and occasional second swallow to clear oral  cavity and orophrayngeal residuals. SLP will f/u for tolerance during  acute stay.       CHL IP TREATMENT RECOMMENDATION 04/01/2015  Treatment Recommendations Therapy as outlined in treatment plan below     CHL IP DIET RECOMMENDATION 04/01/2015  SLP Diet Recommendations Dysphagia 3 (Mech soft);Thin  Liquid Administration via (None)  Medication Administration Whole meds with liquid  Compensations Multiple dry swallows after each bite/sip  Postural Changes and/or Swallow Maneuvers (None)     CHL IP OTHER RECOMMENDATIONS 04/01/2015  Recommended Consults (None)  Oral Care Recommendations Oral care BID  Other Recommendations (None)     CHL IP FOLLOW UP RECOMMENDATIONS 03/29/2015  Follow up Recommendations 24 hour supervision/assistance     CHL IP FREQUENCY AND DURATION 04/01/2015  Speech Therapy Frequency (ACUTE ONLY) min 2x/week  Treatment Duration 2 weeks     Pertinent Vitals/Pain NA    SLP Swallow Goals No flowsheet data found.  No flowsheet data found.    CHL IP REASON FOR REFERRAL 04/01/2015  Reason for Referral Objectively evaluate swallowing function     CHL IP ORAL PHASE 04/01/2015  Lips (None)  Tongue (None)  Mucous membranes (None)  Nutritional status (None)  Other (None)  Oxygen therapy (None)  Oral Phase Impaired  Oral - Pudding Teaspoon (None)  Oral - Pudding Cup (None)  Oral - Honey Teaspoon (None)  Oral - Honey Cup (None)  Oral - Honey Syringe (None)  Oral - Nectar Teaspoon (None)  Oral - Nectar  Cup (None)  Oral - Nectar Straw (None)  Oral - Nectar Syringe (None)  Oral - Ice Chips (None)  Oral - Thin Teaspoon (None)  Oral - Thin Cup (None)  Oral - Thin Straw (None)  Oral - Thin Syringe (None)  Oral -  Puree (None)  Oral - Mechanical Soft (None)  Oral - Regular (None)  Oral - Multi-consistency (None)  Oral - Pill (None)  Oral Phase - Comment (None)      CHL IP PHARYNGEAL PHASE 04/01/2015  Pharyngeal Phase Impaired  Pharyngeal - Pudding Teaspoon (None)  Penetration/Aspiration details (pudding teaspoon) (None)  Pharyngeal - Pudding Cup (None)  Penetration/Aspiration details (pudding cup) (None)  Pharyngeal - Honey Teaspoon (None)  Penetration/Aspiration details (honey teaspoon) (None)  Pharyngeal - Honey Cup (None)  Penetration/Aspiration details (honey cup) (None)  Pharyngeal - Honey Syringe (None)  Penetration/Aspiration details (honey syringe) (None)  Pharyngeal - Nectar Teaspoon (None)  Penetration/Aspiration details (nectar teaspoon) (None)  Pharyngeal - Nectar Cup (None)  Penetration/Aspiration details (nectar cup) (None)  Pharyngeal - Nectar Straw (None)  Penetration/Aspiration details (nectar straw) (None)  Pharyngeal - Nectar Syringe (None)  Penetration/Aspiration details (nectar syringe) (None)  Pharyngeal - Ice Chips (None)  Penetration/Aspiration details (ice chips) (None)  Pharyngeal - Thin Teaspoon (None)  Penetration/Aspiration details (thin teaspoon) (None)  Pharyngeal - Thin Cup (None)  Penetration/Aspiration details (thin cup) (None)  Pharyngeal - Thin Straw (None)  Penetration/Aspiration details (thin straw) (None)  Pharyngeal - Thin Syringe (None)  Penetration/Aspiration details (thin syringe') (None)  Pharyngeal - Puree (None)  Penetration/Aspiration details (puree) (None)  Pharyngeal - Mechanical Soft (None)  Penetration/Aspiration details (mechanical soft) (None)  Pharyngeal - Regular (None)  Penetration/Aspiration details (regular) (None)  Pharyngeal - Multi-consistency (None)  Penetration/Aspiration details (multi-consistency) (None)  Pharyngeal - Pill (None)  Penetration/Aspiration details (pill) (None)  Pharyngeal Comment (None)      No flowsheet data found.  No flowsheet data found.         Harlon Ditty, [place] CCC-SLP [phone]  Claudine Mouton 04/01/2015, 10:31 AM    Ct Image Guided Drainage Percut Cath  Peritoneal Retroperit  04/04/2015   CLINICAL DATA:  81 year old with postoperative fluid collections. Concern for intra-abdominal abscesses.  EXAM: CT-GUIDED DRAIN PLACEMENT IN LEFT UPPER ABDOMINAL FLUID COLLECTION  CT-GUIDED DRAIN PLACEMENT IN LEFT LATERAL ABDOMINAL FLUID COLLECTION  Physician: Rachelle Hora. Lowella Dandy, MD  FLUOROSCOPY TIME:  None  MEDICATIONS: 4 mg versed, 100 mcg of fentanyl. A radiology nurse monitored the patient for moderate sedation.  ANESTHESIA/SEDATION: Moderate sedation time: 50 min  PROCEDURE: Informed consent was obtained for CT-guided drain placements. Patient was placed supine on the CT scanner. Images through the abdomen were obtained. The collections in the left upper abdomen and lateral left abdomen were identified. The left side of the abdomen was prepped and draped in sterile fashion. The collection in the left upper abdomen and perisplenic region was initially targeted. Skin was anesthetized with 1% lidocaine. 18 gauge needle was directed into the fluid collection just posterior to the spleen and cloudy yellow fluid was aspirated. A stiff Amplatz wire was placed and the tract was dilated to accommodate a 10.2 [place] multipurpose drain. Catheter sutured to skin and attached to a suction bulb.  Attention was directed to the left lateral abdominal fluid collection. The left lateral abdomen was anesthetized with 1% lidocaine. 18 gauge needle directed into the fluid collection and cloudy yellow fluid was aspirated. Stiff Amplatz wire was placed and the tract was dilated to accommodate a 10.2 [place]  multipurpose drain. Catheter sutured to skin and attached to a suction bulb.  FINDINGS: Left upper abdominal fluid collection in the perisplenic region. Drain was successfully placed just posterior to the spleen. There is a second collection in the lateral left mid abdomen.  Drain successfully placed in this collection.  Estimated blood loss: Minimal  COMPLICATIONS: None  IMPRESSION: Placement of CT-guided drainage catheters in two left abdominal fluid collections. Cloudy yellow fluid was removed from both collections. Samples were sent for culture.   Electronically Signed   By: Richarda Overlie M.D.   On: 04/04/2015 08:32   Ct Image Guided Drainage Percut Cath  Peritoneal Retroperit  04/04/2015   CLINICAL DATA:  81 year old with postoperative fluid collections. Concern for intra-abdominal abscesses.  EXAM: CT-GUIDED DRAIN PLACEMENT IN LEFT UPPER ABDOMINAL FLUID COLLECTION  CT-GUIDED DRAIN PLACEMENT IN LEFT LATERAL ABDOMINAL FLUID COLLECTION  Physician: Rachelle Hora. Lowella Dandy, MD  FLUOROSCOPY TIME:  None  MEDICATIONS: 4 mg versed, 100 mcg of fentanyl. A radiology nurse monitored the patient for moderate sedation.  ANESTHESIA/SEDATION: Moderate sedation time: 50 min  PROCEDURE: Informed consent was obtained for CT-guided drain placements. Patient was placed supine on the CT scanner. Images through the abdomen were obtained. The collections in the left upper abdomen and lateral left abdomen were identified. The left side of the abdomen was prepped and draped in sterile fashion. The collection in the left upper abdomen and perisplenic region was initially targeted. Skin was anesthetized with 1% lidocaine. 18 gauge needle was directed into the fluid collection just posterior to the spleen and cloudy yellow fluid was aspirated. A stiff Amplatz wire was placed and the tract was dilated to accommodate a 10.2 [place] multipurpose drain. Catheter sutured to skin and attached to a suction bulb.  Attention was directed to the left lateral abdominal fluid collection. The left lateral abdomen was anesthetized with 1% lidocaine. 18 gauge needle directed into the fluid collection and cloudy yellow fluid was aspirated. Stiff Amplatz wire was placed and the tract was dilated to accommodate a 10.2 [place] multipurpose  drain. Catheter sutured to skin and attached to a suction bulb.  FINDINGS: Left upper abdominal fluid collection in the perisplenic region. Drain was successfully placed just posterior to the spleen. There is a second collection in the lateral left mid abdomen. Drain successfully placed in this collection.  Estimated blood loss: Minimal  COMPLICATIONS: None  IMPRESSION: Placement of CT-guided drainage catheters in two left abdominal fluid collections. Cloudy yellow fluid was removed from both collections. Samples were sent for culture.   Electronically Signed   By: Richarda Overlie M.D.   On: 04/04/2015 08:32     Medical Consultants:    Surgery - Dr. Glenna Fellows   Cardiology   PCCM  Neurology - Dr. Thana Farr   Anti-Infectives:    Mefoxin 03/25/2015 --> 03/26/2015  Rocephin 03/26/2015 -->   Flagyl 03/26/2015 -->  Subjective:   [name] reports abdominal pain. No nausea or vomiting. No BM TODAY.   Objective:    Filed Vitals:   04/06/15 0800 04/06/15 1136 04/06/15 1535 04/06/15 1544  BP:  148/76 158/67   Pulse:  104 102   Temp: 99 F (37.2 C) 98.7 F (37.1 C) 98.5 F (36.9 C)   TempSrc: Oral Oral Oral   Resp:   20   Height:      Weight:      SpO2:   100% 100%    Intake/Output Summary (Last 24 hours) at 04/06/15 1554 Last data  filed at 04/06/15 1500  Gross per 24 hour  Intake    160 ml  Output   2245 ml  Net  -2085 ml    Exam: Gen:  NAD Cardiovascular:  RRR, No M/R/G Respiratory:  Lungs CTAB Gastrointestinal:  Abdomen softly distended, tender, + BS Extremities:  1+ edema   Data Reviewed:    Labs: Basic Metabolic Panel:  Recent Labs Lab 03/31/15 0350 04/01/15 0450 04/02/15 0537  04/03/15 0545 04/04/15 0650 04/05/15 1000 04/06/15 0445  NA 136 135 135  --  132* 132* 132* 132*  K 4.8 5.1 5.8*  < > 4.9 4.7 4.5 4.8  CL 102 104 103  --  100* 103 103 102  CO2 25 24 25   --  24 21* 22 22  GLUCOSE 176* 185* 163*  --  195* 174* 113* 153*    BUN 48* 53* 48*  --  45* 46* 46* 47*  CREATININE 0.98 0.89 0.80  --  0.75 0.83 0.83 0.74  CALCIUM 8.2* 8.0* 8.5*  --  8.3* 8.2* 8.3* 8.7*  MG 1.7 1.8 1.7  --   --  1.5* 2.1  --   PHOS 4.5 4.7* 4.6  --   --  3.8 4.4  --   < > = values in this interval not displayed. GFR Estimated Creatinine Clearance: 53.9 mL/min (by C-G formula based on Cr of 0.74). Liver Function Tests:  Recent Labs Lab 03/31/15 0350 04/04/15 0650  AST 26 45*  ALT 14 26  ALKPHOS 98 139*  BILITOT 0.4 0.5  PROT 5.9* 6.8  ALBUMIN 1.8* 1.9*   Coagulation profile  Recent Labs Lab 04/03/15 1140  INR 1.22    CBC:  Recent Labs Lab 04/01/15 0450 04/02/15 0537 04/03/15 0545 04/04/15 0650 04/06/15 0445  WBC 14.8* 16.9* 16.9* 16.7* 14.3*  NEUTROABS  --   --   --  15.3*  --   HGB 6.4* 9.1* 9.0* 8.8* 8.5*  HCT 19.5* 28.7* 29.3* 28.3* 27.4*  MCV 89.9 82.0 81.6 83.2 82.8  PLT 297 364 370 465* 530*   Cardiac Enzymes: No results for input(s): CKTOTAL, CKMB, CKMBINDEX, TROPONINI in the last 168 hours. CBG:  Recent Labs Lab 04/05/15 2051 04/06/15 0008 04/06/15 0452 04/06/15 0715 04/06/15 1200  GLUCAP 151* 117* 154* 165* 152*   Sepsis Labs:  Recent Labs Lab 04/02/15 0537 04/03/15 0545 04/04/15 0650 04/06/15 0445  WBC 16.9* 16.9* 16.7* 14.3*   Microbiology Recent Results (from the past 240 hour(s))  Culture, routine-abscess     Status: None (Preliminary result)   Collection Time: 04/03/15  3:18 PM  Result Value Ref Range Status   Specimen Description ABSCESS LEFT ABDOMEN UPPER  Final   Special Requests NONE  Final   Gram Stain   Final    MODERATE WBC PRESENT,BOTH PMN AND MONONUCLEAR NO SQUAMOUS EPITHELIAL CELLS SEEN NO ORGANISMS SEEN Performed at [facility]    Culture   Final    NO GROWTH 2 DAYS Performed at [facility]    Report Status PENDING  Incomplete  Culture, routine-abscess     Status: None (Preliminary result)   Collection Time: 04/03/15  3:18 PM  Result  Value Ref Range Status   Specimen Description ABSCESS ABDOMEN MID  Final   Special Requests NONE  Final   Gram Stain   Final    ABUNDANT WBC PRESENT,BOTH PMN AND MONONUCLEAR NO SQUAMOUS EPITHELIAL CELLS SEEN NO ORGANISMS SEEN Performed at [facility]  Final    FEW ENTEROCOCCUS SPECIES Performed at [facility]    Report Status PENDING  Incomplete     Medications:   . antiseptic oral rinse  7 mL Mouth Rinse q12n4p  . aspirin  300 mg Rectal Daily   Or  . aspirin  325 mg Oral Daily  . bisacodyl  10 mg Rectal Daily  . chlorhexidine  15 mL Mouth Rinse BID  . feeding supplement (ENSURE ENLIVE)  237 mL Oral BID BM  . insulin aspart  0-15 Units Subcutaneous 6 times per day  . lip balm  1 application Topical BID  . metoprolol  2.5 mg Intravenous 4 times per day   Continuous Infusions: . sodium chloride 10 mL/hr (03/29/15 0800)  . TPN (CLINIMIX) Adult without lytes 40 mL/hr at 04/05/15 1752   And  . fat emulsion 240 mL (04/05/15 1752)  . TPN (CLINIMIX) Adult without lytes     And  . fat emulsion      Time spent: 25 minutes.   LOS: 19 days   Zorian Gunderman  [facility] Pager [phone]  If 7PM-7AM, please contact night-coverage at [email or web address], password [email or web address] for any overnight needs.  04/06/2015, 3:54 PM

## 2015-04-06 NOTE — Progress Notes (Signed)
Calorie Count Note  48- hour calorie count ordered.  Diet: Dysphagia III with thin liquids Supplements: Ensure Enlive BID, Magic Cup TID  5/25 Breakfast: 138 kcal, 5 g protein Lunch: not recorded on meal ticket Dinner: not recorded on meal ticket Supplements: 20 kcal, 2 g protein  Meal tickets placed into envelope with no po recorded. RN notified. Surveyor, quantity notified. Calorie count incomplete.   Total intake: Unable to be calculated. PO intake not recorded and placed in envelope in door.   Nutrition Dx: malnutrition related to chronic illness as evidenced by moderate depletion of body fat, severe depletion of muscle mass; ongoing  Goal: Pt to meet >/= 90% of their estimated nutrition needs; not met  Intervention: Continue to wean TPN per pharmacy, provide supplements and meals, monitor for need for PEG  [name] [place], [place], [place]

## 2015-04-06 NOTE — Progress Notes (Addendum)
PARENTERAL NUTRITION CONSULT NOTE - follow up  Pharmacy Consult for TPN Indication: bowel obstruction  Allergies  Allergen Reactions  . Amlodipine Besylate     REACTION: dizzy  . Aspirin   . Atenolol     REACTION: fatigue  . Benazepril     cough  . Benicar [Olmesartan Medoxomil]     HA  . Cozaar     nausea  . Hydrochlorothiazide W-Triamterene     REACTION: dizzy  . Hydrocodone     REACTION: HA  . Hydroxyzine Pamoate   . Iodine   . Lisinopril     REACTION: tired, cough  . Penicillins Itching    tolerates cephalosporins OK  . Pravastatin     myalgias  . Prednisolone     My stomach hurts  . Tramadol Hcl     REACTION: HA    Patient Measurements: Height: 5' 4" (162.6 cm) Weight: 149 lb 4 oz (67.7 kg) IBW/kg (Calculated) : 54.7 Adjusted Body Weight: 57.5kg   Vital Signs: Temp: 101 F (38.3 C) (05/25 0500) Temp Source: Oral (05/25 0500) BP: 138/65 mmHg (05/25 0500) Pulse Rate: 105 (05/25 0500) Intake/Output from previous day: 05/24 0701 - 05/25 0700 In: 350 [P.O.:250; I.V.:90] Out: 2220 [Urine:2150; Drains:70] Intake/Output from this shift:    Labs:  Recent Labs  04/03/15 1140 04/04/15 0650 04/06/15 0445  WBC  --  16.7* 14.3*  HGB  --  8.8* 8.5*  HCT  --  28.3* 27.4*  PLT  --  465* 530*  INR 1.22  --   --      Recent Labs  04/04/15 0650 04/05/15 1000 04/06/15 0445  NA 132* 132* 132*  K 4.7 4.5 4.8  CL 103 103 102  CO2 21* 22 22  GLUCOSE 174* 113* 153*  BUN 46* 46* 47*  CREATININE 0.83 0.83 0.74  CALCIUM 8.2* 8.3* 8.7*  MG 1.5* 2.1  --   PHOS 3.8 4.4  --   PROT 6.8  --   --   ALBUMIN 1.9*  --   --   AST 45*  --   --   ALT 26  --   --   ALKPHOS 139*  --   --   BILITOT 0.5  --   --   PREALBUMIN 13.7*  --   --   TRIG 85  --   --    Estimated Creatinine Clearance: 53.9 mL/min (by C-G formula based on Cr of 0.74).    Recent Labs  04/05/15 2051 04/06/15 0008 04/06/15 0452  GLUCAP 151* 117* 154*    Insulin Requirements: 10  units Novolog on 5/24  Current Nutrition:  Dysphagia 3 diet (start 5/20) Ensure BID ordered on 5/20 (took both cans yesterday)  IVF: NS @ 10 ml/hr  Central access: PICC 5/11 TPN start date: 5/11  ASSESSMENT                                                                                                          HPI: 81 year old female with history of hypertension, coronary artery disease,  hyperlipidemia, iron deficiency anemia, depression, osteoporosis, mild chronic kidney disease presented to the ED with ongoing nausea for 4 days and diffuse crampy lower abdominal pain since one day prior to admission. Associated poor by mouth intake and generalized weakness. Patient was found to have small bowel obstruction.  Pharmacy consulted to start TPN.  Significant events:  5/12: laparoscopic lysis of adhesions with laparotomy and decompression of bowel 5/13: new AKI 5/15: Continue NG tube and NPO d/t bilious output pending BM.   5/19: Remove NGT 5/20 Completed swallow eval - starting dysphagia diet 5/21: TPN changed to electrolytes free d/t high K+; given kayexalate.  Ate about 10% of lunch 5/22: IR drainage of 2 abd abscesses, drains placed 5/23: Weak but with little appetite, no n/v but with post-op pain 5/24: Thrush noted.   5/25: Per RN, TPN found still running at 50 ml/hr this AM, notified RPH and changed rate to 40 ml/hr around 0830.  Today:   Glucose (goal <150) - CBGs mostly in 130-180 range  Electrolytes - wnl except for Na borderline low; CoCa ~10.3  Renal -  SCr improved to wnl  WBC elevated; hgb stable at 9  LFTs - Alk phos bumped; slightly elevated.  Others wnl (5/23)  TGs - wnl (5/23)  Prealbumin: greatly improved 2.7 > 13.7 (5/23)  NUTRITIONAL GOALS                                                                                             RD recs (5/18): 75-85 g/day protein, 1500-1700 Kcal/day. Clinimix E 5/15 at a goal rate of 70m/hr + 20% fat emulsion at  130mhr to provide: 84 g/day protein, 1672 Kcal/day.  PLAN                                                                                                                         1) ) At 1800 today:  Continue Clinimix 5/15 (without lytes) at 40 ml/hr.  Surgery is hoping that gradual weaning will stimulate appetite.  Will leave as plain formula for now as lytes look stable  Continue 20% fat emulsion at 1068mr.   TPN to contain standard multivitamins and trace elements.  Maintain IVF at [facility] q4h  TPN lab panels on Mondays & Thursdays.  F/u LFTs  F/u weaning TPN when pt is tolerating PO intake.   DreReuel BoomharmD Pager: [phone]/2016, 8:09 AM

## 2015-04-06 NOTE — Progress Notes (Signed)
13 Days Post-Op  Subjective: Talking and speech is clear today.  Complains of abdominal pain.  Some times she is tender and sometimes her moans related to touch.  I'm not sure which is correct. Drain 2 is cloudy and she is having some fever now.   She has some foods on tray, but it does not appear that she is eating much.    Objective: Vital signs in last 24 hours: Temp:  [99 F (37.2 C)-101 F (38.3 C)] 99 F (37.2 C) (05/25 0800) Pulse Rate:  [86-105] 105 (05/25 0500) Resp:  [18] 18 (05/25 0500) BP: (136-156)/(58-79) 138/65 mmHg (05/25 0500) SpO2:  [99 %-100 %] 100 % (05/25 0500) Last BM Date: 03/31/15 Diet DIII TNA 250 PO/No stool recorded Drain 1 left 50 ml  Clear drainage Drain 2 right 20 ml  Cloudy drainage Temp up to 101 at 3 AM, VSS WBC minimal improvement  Last CT 5/22 drains 04/04/15  Intake/Output from previous day: 05/24 0701 - 05/25 0700 In: 350 [P.O.:250; I.V.:90] Out: 2220 [Urine:2150; Drains:70] Intake/Output this shift: Total I/O In: 10 [Other:10] Out: -   General appearance: alert, cooperative, no distress and but says she has abdominal pain. Resp: clear to auscultation bilaterally and anterior GI: soft, + BS, she thinks she has had BM, but not sure.  Incision OK, drains clear in #1 and cloudy in #2.  Lab Results:   Recent Labs  04/04/15 0650 04/06/15 0445  WBC 16.7* 14.3*  HGB 8.8* 8.5*  HCT 28.3* 27.4*  PLT 465* 530*    BMET  Recent Labs  04/05/15 1000 04/06/15 0445  NA 132* 132*  K 4.5 4.8  CL 103 102  CO2 22 22  GLUCOSE 113* 153*  BUN 46* 47*  CREATININE 0.83 0.74  CALCIUM 8.3* 8.7*   PT/INR  Recent Labs  04/03/15 1140  LABPROT 15.5*  INR 1.22     Recent Labs Lab 03/31/15 0350 04/04/15 0650  AST 26 45*  ALT 14 26  ALKPHOS 98 139*  BILITOT 0.4 0.5  PROT 5.9* 6.8  ALBUMIN 1.8* 1.9*     Lipase     Component Value Date/Time   LIPASE 30 03/18/2015 0714     Studies/Results: No results  found.  Medications: . antiseptic oral rinse  7 mL Mouth Rinse q12n4p  . aspirin  300 mg Rectal Daily   Or  . aspirin  325 mg Oral Daily  . bisacodyl  10 mg Rectal Daily  . chlorhexidine  15 mL Mouth Rinse BID  . feeding supplement (ENSURE ENLIVE)  237 mL Oral BID BM  . insulin aspart  0-15 Units Subcutaneous 6 times per day  . lip balm  1 application Topical BID  . metoprolol  2.5 mg Intravenous 4 times per day    Assessment/Plan SBO with hx of abdominal hysterectomy and open cholecystectomy LAPAROSCOPY DIAGNOSTIC, LAPAROSCOPIC LYSIS OF ADHESIONS, LAPAROTOMY with decompression of bowel, 03/24/15, Dr. Daphine Deutscher  Sepsis post op Intraabdominal drain placement x 2 by IR 04/03/15 Post op ileus CVA  03/28/15 Malnutrition on TNA CAD with prior MI and stents Multiple medicine allergies (15 listed) Hypertension Hyperlipidemia Anemia AODM Arthritis Anxiety and depression history Chronic back pain Vertigo Antibiotics:  Completed  day course of Ceftriaxone, and flagy  03/30/15;  none since recorded DVT:  Aspirin/SCD's added 04/05/14 she has some at end of bed but not on.  Plan:  Continue drains. Discuss need for abx, WBC still up but improving and she now has fever.  She is tender, although I'm not sure how reliable exam reporting is, she has no rebound, soft, abdomen.       LOS: 19 days    Caid Radin 04/06/2015

## 2015-04-06 NOTE — Progress Notes (Signed)
Occupational Therapy Treatment Patient Details Name: [name] MRN: [id number] DOB: Jan 19, 1933 Today's Date: 04/06/2015    History of present illness 81 year old female with history of hypertension, coronary artery disease, hyperlipidemia, diabetes and pt s/p lap converted to open LOA for SBO on 5/12, complicated by Left sided facial droop / right arm weakness / acute CVA in left lentiform nucleus however neurologist note states reviewed MRI and no acute changes seen   OT comments  Pt is making slow progress in OT.  She did not want to get up to chair today but was agreeable to sitting EOB.  Fatiques quickly.  Assisted with rolling once; then too fatiqued.    Follow Up Recommendations  SNF    Equipment Recommendations  None recommended by OT    Recommendations for Other Services      Precautions / Restrictions Precautions Precautions: Fall Precaution Comments: abdominal drain Restrictions Weight Bearing Restrictions: No       Mobility Bed Mobility     Rolling: Max assist;Total assist;+2 for physical assistance (max A first roll; total A for subsequent rolls) Sidelying to sit: Total assist;+2 for physical assistance       General bed mobility comments: assist to bend R and LLE for rolling.  utilized pad.  Assist for trunk and LEs for sitting up and lying back down  Transfers                      Balance   Sitting-balance support: Feet unsupported;Single extremity supported Sitting balance-Leahy Scale: Poor Sitting balance - Comments: min guard for safety.  Pt tends to lean/prop to R                           ADL       Grooming: Wash/dry face;Min guard;Minimal assistance;Sitting                       Toileting- Clothing Manipulation and Hygiene: Total assistance;+2 for physical assistance;Bed level         General ADL Comments: sat EOB x 10 minutes with min guard to min A; propped on RUE part of time.  Needed to use bedpan, so  assisted with this. Pt assisted with first roll to sit up at EOB but needed total A for subsequent rolling due to fatique.  Rolled to place and remove bedpan and replace chux pads, twice      Vision                     Perception     Praxis      Cognition   Behavior During Therapy: [facility] for tasks assessed/performed Overall Cognitive Status: No family/caregiver present to determine baseline cognitive functioning (slow processing)                       Extremity/Trunk Assessment               Exercises     Shoulder Instructions       General Comments      Pertinent Vitals/ Pain       Pain Assessment: Faces Faces Pain Scale: Hurts even more (with rolling) Pain Location: abdomen Pain Descriptors / Indicators: Moaning (incision burning) Pain Intervention(s): Limited activity within patient's tolerance;Monitored during session;Premedicated before session;Repositioned  Home Living  Prior Functioning/Environment              Frequency Min 2X/week     Progress Toward Goals  OT Goals(current goals can now be found in the care plan section)  Progress towards OT goals: Progressing toward goals (slowly)     Plan Discharge plan remains appropriate    Co-evaluation                 End of Session     Activity Tolerance Patient limited by fatigue   Patient Left in bed;with call bell/phone within reach;with bed alarm set   Nurse Communication          Time: 0354-6568 OT Time Calculation (min): 45 min  Charges: OT General Charges $OT Visit: 1 Procedure OT Treatments $Self Care/Home Management : 8-22 mins $Therapeutic Activity: 23-37 mins  Mccartney Chuba 04/06/2015, 4:53 PM Marica Otter, OTR/L [phone] 04/06/2015

## 2015-04-06 NOTE — Progress Notes (Signed)
SLP Cancellation Note  Patient Details Name: [name] MRN: [id number] DOB: [birth year]   Cancelled treatment:       Reason Eval/Treat Not Completed: Other (comment);Fatigue/lethargy limiting ability to participate (pt reporting fatigue, desired SLP to return at later time)   Donavan Burnet, MS [facility] SLP [phone]

## 2015-04-07 DIAGNOSIS — L8990 Pressure ulcer of unspecified site, unspecified stage: Secondary | ICD-10-CM | POA: Diagnosis not present

## 2015-04-07 LAB — COMPREHENSIVE METABOLIC PANEL
ALT: 32 U/L (ref 14–54)
AST: 43 U/L — ABNORMAL HIGH (ref 15–41)
Albumin: 2 g/dL — ABNORMAL LOW (ref 3.5–5.0)
Alkaline Phosphatase: 204 U/L — ABNORMAL HIGH (ref 38–126)
Anion gap: 9 (ref 5–15)
BUN: 46 mg/dL — ABNORMAL HIGH (ref 6–20)
CO2: 22 mmol/L (ref 22–32)
CREATININE: 0.82 mg/dL (ref 0.44–1.00)
Calcium: 8.9 mg/dL (ref 8.9–10.3)
Chloride: 104 mmol/L (ref 101–111)
GFR calc Af Amer: >60 mL/min (ref >60)
Glucose, Bld: 136 mg/dL — ABNORMAL HIGH (ref 65–99)
POTASSIUM: 5 mmol/L (ref 3.5–5.1)
SODIUM: 135 mmol/L (ref 135–145)
TOTAL PROTEIN: 7 g/dL (ref 6.5–8.1)
Total Bilirubin: 0.4 mg/dL (ref 0.3–1.2)

## 2015-04-07 LAB — CULTURE, ROUTINE-ABSCESS: CULTURE: NO GROWTH

## 2015-04-07 LAB — CBC WITH DIFFERENTIAL/PLATELET
Basophils Absolute: 0 10*3/uL (ref 0.0–0.1)
Basophils Relative: 0 % (ref 0–1)
EOS ABS: 0.1 10*3/uL (ref 0.0–0.7)
Eosinophils Relative: 1 % (ref 0–5)
HEMATOCRIT: 25.8 % — AB (ref 36.0–46.0)
HEMOGLOBIN: 7.8 g/dL — AB (ref 12.0–15.0)
Lymphocytes Relative: 6 % — ABNORMAL LOW (ref 12–46)
Lymphs Abs: 0.8 10*3/uL (ref 0.7–4.0)
MCH: 24.9 pg — ABNORMAL LOW (ref 26.0–34.0)
MCHC: 30.2 g/dL (ref 30.0–36.0)
MCV: 82.4 fL (ref 78.0–100.0)
MONO ABS: 0.6 10*3/uL (ref 0.1–1.0)
MONOS PCT: 4 % (ref 3–12)
NEUTROS ABS: 12.4 10*3/uL — AB (ref 1.7–7.7)
NEUTROS PCT: 89 % — AB (ref 43–77)
PLATELETS: 506 10*3/uL — AB (ref 150–400)
RBC: 3.13 MIL/uL — ABNORMAL LOW (ref 3.87–5.11)
RDW: 21.9 % — ABNORMAL HIGH (ref 11.5–15.5)
WBC: 13.9 10*3/uL — AB (ref 4.0–10.5)

## 2015-04-07 LAB — GLUCOSE, CAPILLARY
GLUCOSE-CAPILLARY: 182 mg/dL — AB (ref 65–99)
GLUCOSE-CAPILLARY: 172 mg/dL — AB (ref 65–99)
Glucose-Capillary: 106 mg/dL — ABNORMAL HIGH (ref 65–99)
Glucose-Capillary: 115 mg/dL — ABNORMAL HIGH (ref 65–99)
Glucose-Capillary: 119 mg/dL — ABNORMAL HIGH (ref 65–99)
Glucose-Capillary: 220 mg/dL — ABNORMAL HIGH (ref 65–99)

## 2015-04-07 LAB — MAGNESIUM: Magnesium: 1.6 mg/dL — ABNORMAL LOW (ref 1.7–2.4)

## 2015-04-07 LAB — LIPASE, BLOOD: Lipase: 112 U/L — ABNORMAL HIGH (ref 22–51)

## 2015-04-07 LAB — PHOSPHORUS: Phosphorus: 4.2 mg/dL (ref 2.5–4.6)

## 2015-04-07 MED ORDER — FLUCONAZOLE IN SODIUM CHLORIDE 200-0.9 MG/100ML-% IV SOLN
200.0000 mg | INTRAVENOUS | Status: DC
  Administered 2015-04-07 – 2015-04-08 (×2): 200 mg via INTRAVENOUS
  Filled 2015-04-07 (×2): qty 100

## 2015-04-07 MED ORDER — M.V.I. ADULT IV INJ
INJECTION | INTRAVENOUS | Status: AC
  Administered 2015-04-07: 18:00:00 via INTRAVENOUS
  Filled 2015-04-07: qty 960

## 2015-04-07 MED ORDER — DOCUSATE SODIUM 100 MG PO CAPS
100.0000 mg | ORAL_CAPSULE | Freq: Two times a day (BID) | ORAL | Status: DC
  Administered 2015-04-07 – 2015-04-12 (×11): 100 mg via ORAL
  Filled 2015-04-07 (×11): qty 1

## 2015-04-07 MED ORDER — MAGNESIUM SULFATE 2 GM/50ML IV SOLN
2.0000 g | Freq: Once | INTRAVENOUS | Status: AC
  Administered 2015-04-07: 2 g via INTRAVENOUS
  Filled 2015-04-07: qty 50

## 2015-04-07 MED ORDER — POLYETHYLENE GLYCOL 3350 17 G PO PACK: 17.0000 g | PACK | Freq: Every day | ORAL | Status: DC | PRN

## 2015-04-07 MED ORDER — VANCOMYCIN HCL 500 MG IV SOLR
500.0000 mg | Freq: Two times a day (BID) | INTRAVENOUS | Status: DC
  Administered 2015-04-07 – 2015-04-09 (×4): 500 mg via INTRAVENOUS
  Filled 2015-04-07 (×5): qty 500

## 2015-04-07 MED ORDER — FAT EMULSION 20 % IV EMUL
240.0000 mL | INTRAVENOUS | Status: AC
  Administered 2015-04-07: 240 mL via INTRAVENOUS
  Filled 2015-04-07: qty 250

## 2015-04-07 NOTE — Progress Notes (Signed)
PARENTERAL NUTRITION CONSULT NOTE - follow up  Pharmacy Consult for TPN Indication: bowel obstruction  Allergies  Allergen Reactions  . Amlodipine Besylate     REACTION: dizzy  . Aspirin   . Atenolol     REACTION: fatigue  . Benazepril     cough  . Benicar [Olmesartan Medoxomil]     HA  . Cozaar     nausea  . Hydrochlorothiazide W-Triamterene     REACTION: dizzy  . Hydrocodone     REACTION: HA  . Hydroxyzine Pamoate   . Iodine   . Lisinopril     REACTION: tired, cough  . Penicillins Itching    tolerates cephalosporins OK  . Pravastatin     myalgias  . Prednisolone     My stomach hurts  . Tramadol Hcl     REACTION: HA    Patient Measurements: Height: _0  (162.6 cm) Weight: 149 lb 4 oz (67.7 kg) IBW/kg (Calculated) : 54.7 Adjusted Body Weight: 57.5kg   Vital Signs: Temp: 97.7 F (36.5 C) (05/26 0356) Temp Source: Oral (05/26 0356) BP: 159/65 mmHg (05/26 0356) Pulse Rate: 105 (05/26 0356) Intake/Output from previous day: 05/25 0701 - 05/26 0700 In: 1190 [P.O.:440; I.V.:120; TPN:600] Out: 1608 [Urine:1550; Drains:58] Intake/Output from this shift:    Labs:  Recent Labs  04/06/15 0445 04/07/15 0454  WBC 14.3* 13.9*  HGB 8.5* 7.8*  HCT 27.4* 25.8*  PLT 530* 506*     Recent Labs  04/05/15 1000 04/06/15 0445 04/07/15 0454  NA 132* 132* 135  K 4.5 4.8 5.0  CL 103 102 104  CO2 _1 GLUCOSE 113* 153* 136*  BUN 46* 47* 46*  CREATININE 0.83 0.74 0.82  CALCIUM 8.3* 8.7* 8.9  MG 2.1  --  1.6*  PHOS 4.4  --  4.2  PROT  --   --  7.0  ALBUMIN  --   --  2.0*  AST  --   --  43*  ALT  --   --  32  ALKPHOS  --   --  204*  BILITOT  --   --  0.4   Estimated Creatinine Clearance: 52.6 mL/min (by C-G formula based on Cr of 0.82).    Recent Labs  04/07/15 0009 04/07/15 0355 04/07/15 0757  GLUCAP 106* 115* 220*    Insulin Requirements: 12 units Novolog on 5/25  Current Nutrition:  Dysphagia 3 diet (start 5/20) Ensure BID ordered on  5/20 (took 1 of 2 cans yesterday)  IVF: NS @ 10 ml/hr  Central access: PICC 5/11 TPN start date: 5/11  ASSESSMENT                                                                                                          HPI: 81 year old female with history of hypertension, coronary artery disease, hyperlipidemia, iron deficiency anemia, depression, osteoporosis, mild chronic kidney disease presented to the ED with ongoing nausea for 4 days and diffuse crampy lower abdominal pain since one day prior to admission. Associated poor by mouth intake and  generalized weakness. Patient was found to have small bowel obstruction.  Pharmacy consulted to start TPN.  Significant events:  5/12: laparoscopic lysis of adhesions with laparotomy and decompression of bowel 5/13: new AKI 5/15: Continue NG tube and NPO d/t bilious output pending BM.   5/19: Remove NGT 5/20 Completed swallow eval - starting dysphagia diet 5/21: TPN changed to electrolytes free d/t high K+; given kayexalate.  Ate about 10% of lunch 5/22: IR drainage of 2 abd abscesses, drains placed 5/23: Weak but with little appetite, no n/v but with post-op pain 5/24: Thrush noted.   5/25: Per RN, TPN found still running at 50 ml/hr this AM, notified [place] and changed rate to 40 ml/hr around 0830. 5/26: Little improvement in appetite but Surgery wants to continue 1/2 dose TPN to stimulate appetite; considering PEG if not improving soon   Today:   Glucose (goal <150) - some CBGs in 150s.  1 CBG > 220 this AM  Electrolytes - Na improved to wnl.  K, CorrCa borderline high. Mg sl low.  Renal -  SCr improved to wnl  WBC elevated; hgb stable at 9  LFTs - Alk phos continues to rise; slightly elevated.  Others wnl (5/23)  TGs - wnl (5/23)  Prealbumin: greatly improved 2.7 > 13.7 (5/23)  NUTRITIONAL GOALS                                                                                             RD recs (5/18): 75-85 g/day protein,  1500-1700 Kcal/day. Clinimix E 5/15 at a goal rate of 16m/hr + 20% fat emulsion at 154mhr to provide: 84 g/day protein, 1672 Kcal/day.  PLAN                                                                                                                         1) ) At 1800 today:  Continue Clinimix 5/15 (without lytes) at 40 ml/hr.  Will leave as plain formula for now with K/Ca rising  Continue 20% fat emulsion at 1047mr.   TPN to contain standard multivitamins and trace elements.  Mg 2g IV x 1  [place] [facility] [place]  TPN lab panels on Mondays & Thursdays.  BMP, Mg, tomorrow  F/u LFTs  F/u Surgery plans for continued TPN vs PEG placement   DreReuel BoomharmD Pager: [phone]/2016, 10:27 AM

## 2015-04-07 NOTE — Progress Notes (Signed)
Physical Therapy Treatment Patient Details Name: [name] MRN: [id number] DOB: Mar 18, 1933 Today's Date: 04/07/2015    History of Present Illness 81 year old female with history of hypertension, coronary artery disease, hyperlipidemia, diabetes and pt s/p lap converted to open LOA for SBO on 5/12, complicated by Left sided facial droop / right arm weakness / acute CVA in left lentiform nucleus however neurologist note states reviewed MRI and no acute changes seen    PT Comments    POD # 14 Exp Lap.  Son Alinda Money in room assisting with feeding his mom.  Very supportive and encouraging.  Assisted pt OOB to [facility] + 2 total assist.  Assisted from [facility] to recliner + 2 total assist.  Pt was unable to support self in standing and unable to attempt gait despite using B Platform EVA walker and + 2 assist. Positioned in recliner chair.  Hoyer back to bed.  Follow Up Recommendations  SNF ([name])     Equipment Recommendations       Recommendations for Other Services       Precautions / Restrictions Precautions Precautions: Fall Precaution Comments: 2 abdominal drain L side Restrictions Weight Bearing Restrictions: No    Mobility  Bed Mobility Overal bed mobility: Needs Assistance Bed Mobility: Rolling;Supine to Sit Rolling: +2 for physical assistance;Total assist (5%)   Supine to sit: +2 for physical assistance;Total assist (5%)     General bed mobility comments: + 2 Total Assist pt 5% despite MAX encouragement.  Sat EOB with Min Assist x 4 min moaning and complaining "stiffness" bvody aches  Transfers Overall transfer level: Needs assistance Equipment used: None;Bilateral platform walker Therapist, sports) Transfers: Pharmacologist;Sit to/from Stand Sit to Stand: +2 physical assistance;Total assist (pt 10%) Stand pivot transfers: Total assist;+2 physical assistance (pt 0%)       General transfer comment: stand pivot 1/4 turn from elevated nbed to BSC partial turn  completionj via "Bear Hug" with blocked knees.  Then sit to stand + 2 total sisde by side assist from lower level BSC pt 5% to staning with B platform walker.    Ambulation/Gait             General Gait Details: despite much effort, pt was unable to unload either LE to weightshift and advance either LE to step.  Briefly stood approx 30 sec enough time to switch out [facility] and recliner chair behind her.    Stairs            Wheelchair Mobility    Modified Rankin (Stroke Patients Only)       Balance                                    Cognition Arousal/Alertness: Awake/alert Behavior During Therapy: WFL for tasks assessed/performed Overall Cognitive Status: Within Functional Limits for tasks assessed (self limiting)                      Exercises      General Comments        Pertinent Vitals/Pain Pain Assessment: Faces Pain Score: 6  Faces Pain Scale: Hurts whole lot Pain Location: R foot Pain Descriptors / Indicators: Grimacing;Tender Pain Intervention(s): Monitored during session;Repositioned    Home Living                      Prior Function  PT Goals (current goals can now be found in the care plan section) Progress towards PT goals: Progressing toward goals    Frequency       PT Plan      Co-evaluation             End of Session Equipment Utilized During Treatment: Gait belt Activity Tolerance: Patient limited by fatigue (pt self limiting) Patient left: in chair;with call bell/phone within reach;with family/visitor present     Time: 1440-1510 PT Time Calculation (min) (ACUTE ONLY): 30 min  Charges:  $Therapeutic Activity: 23-37 mins                    G Codes:      Felecia Shelling  PTA [facility]  Acute  Rehab Pager      [phone]

## 2015-04-07 NOTE — Progress Notes (Signed)
Progress Note   [name]:[id number] DOB: 07/27/1933 DOA: 03/18/2015 PCP: [name], MD   Brief Narrative:   [name] is an 81 y.o. female with a PMH of hypertension, CAD, hyperlipidemia and diabetes who was admitted 03/18/15 with SBO secondary to adhesions. She failed conservative therapy and underwent laparoscopic lysis of adhesions/laparotomy with bowel decompression on 03/24/15. Her hospital course was complicated by sepsis secondary to peritoneal contamination with transfer to the SDU on 03/25/15. She also developed left-sided facial droop/right arm weakness 03/27/15 with CT scan showing a 4 mm lacunar infarct in the left lentiform nucleus. MRI failed to show any abnormality. Due to failure to improve an ongoing high need for pain control, a repeat CT scan was done 04/02/15 which showed 2 large fluid collections. She underwent percutaneous drainage with placement of abscess drains by IR 04/04/15. Patient has fever on 5/25, but so far all cultures are negative and repeat CXR shows worsening atelectasis and no pneumonia. Defer CT to 5/28  Assessment/Plan:   Principal Problem:   Small bowel obstruction s/p exlap/LOA/decompression 03/25/2015 complicated by severe sepsis secondary to peritonitis / abdominal pain now complicated by intra-abdominal abscess/fluid collection - Status post laparoscopic lysis of adhesions/laparotomy with bowel decompression 03/24/15. - Subsequently developed acute peritonitis/sepsis. - Initially treated with Mefoxin starting 03/25/15. Antibiotics switched to Flagyl/Rocephin 03/26/15.   - CT scan of the abdomen/pelvis done 04/02/15 which showed 2 large fluid collections.  - 2 percutaneous drains placed by IR 04/03/15 to drain fluid collections. Follow-up abscess cultures. - Plan to repeat CT scan once drainage subsides.  - afebrile and leukcytosis is improving.   Active Problems:   Facial droop / right arm weakness / possible acute CVA left lentiform  nucleus - On 03/27/15, CT of the head done to evaluate symptoms showed an acute 4 mm lacunar infarct in the left lentiform nucleus. - Subsequent MRI negative, although clinical findings consistent with acute CVA. MRA negative for stenosis. - 2-D echo with normal EF, no diastolic dysfunction noted. No intracardiac masses or thrombi. - Carotid Dopplers negative for significant stenosis (1-39% bilaterally). - Hemoglobin A1c 6.3%. Cholesterol 99. - Evaluated by neurology, no further stroke workup recommended. - Continue aspirin. - PT/OT.    Hypokalemia/hypomagnesemia - Monitor and replace electrolytes as needed.    Acute postoperative blood loss anemia - Status post 2 units of PRBCs 04/01/15. - Continue to monitor hemoglobin and transfuse for hemoglobin less than 7.    Diabetes type 2, controlled - Currently being managed with moderate scale SSI every 6 hours. CBGs 103-156. - Hemoglobin A1c 6.3%. CBG (last 3)   Recent Labs  04/07/15 0355 04/07/15 0757 04/07/15 1207  GLUCAP 115* 220* 119*        Anxiety state/Adjustment disorder with mixed anxiety and depressed mood - Mood has been depressed with limited motivation to participate in PT.    Coronary atherosclerosis - Continue aspirin therapy.    Acute renal failure secondary to sepsis  - Resolved with IV fluids.    Right foot pain  - Radiographs negative for fracture. Degenerative changes noted.    Polymyalgia rheumatica/Chronic fatigue disorder - PT/OT.    Elevated blood pressure - Continue as needed Lopressor.    Malnutrition of moderate degree - Continue TPN for nutritional support. Begin to wean.    Acute delirium - Avoid sedating medications. Family requests patient does not get Ativan or Haldol.    Thrombocytopenia - Mild. Likely related to recent sepsis.  DVT Prophylaxis - Continue SCDs.  Code Status: Full. Family Communication: no family present today.   Disposition Plan: Home with daughter when diet  advanced,.   IV Access:    PICC placed 03/23/15   Procedures and diagnostic studies:   Dg Chest 2 View  04/06/2015   CLINICAL DATA:  Fever  EXAM: CHEST  2 VIEW  COMPARISON:  03/27/2015  FINDINGS: Cardiac shadow is mildly enlarged. Left basilar atelectasis is again identified. Two recently placed drainage catheters are noted in the left upper abdomen. Mild right basilar atelectasis is noted. A left-sided PICC line is noted with the tip at the junction of the innominate veins. This has withdrawn slightly in the interval from the prior exam. No acute bony abnormality is seen.  IMPRESSION: Bibasilar atelectasis. This has increased in the interval from the prior exam.  Left upper quadrant drainage catheters.   Electronically Signed   By: Alcide Clever M.D.   On: 04/06/2015 14:57   Ct Head Wo Contrast  03/27/2015   CLINICAL DATA:  RIGHT-sided facial droop. Symptoms began earlier today. Altered mental status.  EXAM: CT HEAD WITHOUT CONTRAST  TECHNIQUE: Contiguous axial images were obtained from the base of the skull through the vertex without intravenous contrast.  COMPARISON:  05/27/2014.  FINDINGS: Possible acute 4 mm lacune, LEFT lentiform nucleus as seen on image 12 series 5. This was not clearly present on in 2015 scan.  No other areas of concern for cortical or subcortical infarction.  No hemorrhage, mass lesion, hydrocephalus, or extra-axial fluid.  Cerebral and cerebellar atrophy. Generalized white matter hypoattenuation, likely small vessel disease. Than the calvarium intact. Vascular calcification. No sinus or mastoid disease. Dense lenticular opacities.  IMPRESSION: Possible acute 4 mm lacunar infarct LEFT lentiform nucleus. If further investigation desired, and no contraindications, consider MRI brain.   Electronically Signed   By: Davonna Belling M.D.   On: 03/27/2015 16:25   Mr Maxine Glenn Head Wo Contrast  03/28/2015   CLINICAL DATA:  81 year old with right-sided facial droop. Altered mental status.  Subsequent encounter.  EXAM: MRI HEAD WITHOUT CONTRAST  MRA HEAD WITHOUT CONTRAST  TECHNIQUE: Multiplanar, multiecho pulse sequences of the brain and surrounding structures were obtained without intravenous contrast. Angiographic images of the head were obtained using MRA technique without contrast.  COMPARISON:  03/27/2015 head CT.  FINDINGS: MRI HEAD FINDINGS  No acute infarct.  No intracranial hemorrhage.  Remote tiny infarct right cerebellum. Minimal small vessel disease type changes.  Incidentally noted is a prominent peri vascular space left lenticular nucleus.  Global atrophy without hydrocephalus.  No intracranial mass lesion noted on this unenhanced exam.  Cervical medullary junction unremarkable. Mild spinal stenosis C3-4.  Expanded partially empty sella without other findings of pseudotumor cerebri  Pineal region unremarkable.  No intracranial mass lesion noted on this unenhanced exam.  MRA HEAD FINDINGS  Mild narrowing pre cavernous/ cavernous segment right internal carotid artery.  Mild ectasia left internal carotid artery cavernous segment. The minimal bulge along the left internal carotid artery cavernous segment most likely related to slight ectasia of the left ophthalmic artery origin rather than small aneurysm. Mild motion degradation limits evaluation.  Slightly ectatic anterior communicating artery without aneurysm.  Middle cerebral artery mild branch vessel irregularity bilaterally.  No significant stenosis distal vertebral arteries or basilar artery. Regions of mild irregularity and slight narrowing.  IMPRESSION: MRI HEAD  No acute infarct.  Remote tiny infarct right cerebellum.  Incidentally noted is a prominent peri vascular space left lenticular  nucleus.  Global atrophy without hydrocephalus.  Expanded partially empty sella without other findings of pseudotumor cerebri  MRA HEAD FINDINGS  No medium or large size vessel significant stenosis or occlusion. Evaluation of branch vessels slightly  limited by motion degradation.   Electronically Signed   By: Lacy Duverney M.D.   On: 03/28/2015 12:02   Mr Brain Wo Contrast  03/28/2015   CLINICAL DATA:  81 year old with right-sided facial droop. Altered mental status. Subsequent encounter.  EXAM: MRI HEAD WITHOUT CONTRAST  MRA HEAD WITHOUT CONTRAST  TECHNIQUE: Multiplanar, multiecho pulse sequences of the brain and surrounding structures were obtained without intravenous contrast. Angiographic images of the head were obtained using MRA technique without contrast.  COMPARISON:  03/27/2015 head CT.  FINDINGS: MRI HEAD FINDINGS  No acute infarct.  No intracranial hemorrhage.  Remote tiny infarct right cerebellum. Minimal small vessel disease type changes.  Incidentally noted is a prominent peri vascular space left lenticular nucleus.  Global atrophy without hydrocephalus.  No intracranial mass lesion noted on this unenhanced exam.  Cervical medullary junction unremarkable. Mild spinal stenosis C3-4.  Expanded partially empty sella without other findings of pseudotumor cerebri  Pineal region unremarkable.  No intracranial mass lesion noted on this unenhanced exam.  MRA HEAD FINDINGS  Mild narrowing pre cavernous/ cavernous segment right internal carotid artery.  Mild ectasia left internal carotid artery cavernous segment. The minimal bulge along the left internal carotid artery cavernous segment most likely related to slight ectasia of the left ophthalmic artery origin rather than small aneurysm. Mild motion degradation limits evaluation.  Slightly ectatic anterior communicating artery without aneurysm.  Middle cerebral artery mild branch vessel irregularity bilaterally.  No significant stenosis distal vertebral arteries or basilar artery. Regions of mild irregularity and slight narrowing.  IMPRESSION: MRI HEAD  No acute infarct.  Remote tiny infarct right cerebellum.  Incidentally noted is a prominent peri vascular space left lenticular nucleus.  Global atrophy  without hydrocephalus.  Expanded partially empty sella without other findings of pseudotumor cerebri  MRA HEAD FINDINGS  No medium or large size vessel significant stenosis or occlusion. Evaluation of branch vessels slightly limited by motion degradation.   Electronically Signed   By: Lacy Duverney M.D.   On: 03/28/2015 12:02   Ct Abdomen Pelvis W Contrast  04/02/2015   CLINICAL DATA:  Nonspecific abdominal pain. Status post recent laparotomy for small bowel obstruction.  EXAM: CT ABDOMEN AND PELVIS WITH CONTRAST  TECHNIQUE: Multidetector CT imaging of the abdomen and pelvis was performed using the standard protocol following bolus administration of intravenous contrast.  CONTRAST:  50mL OMNIPAQUE IOHEXOL 300 MG/ML SOLN, OMNIPAQUE IOHEXOL 300 MG/ML SOLN  COMPARISON:  Renal ultrasound dated 03/25/2015 and abdomen and pelvis CT dated 03/18/2015.  FINDINGS: Cholecystectomy clips. Interval midline surgical scar in the skin clips at the level of the pelvis. Loculated fluid with peripheral rim enhancement beneath the left hemidiaphragm and partially surrounding the spleen. This measures 7.7 x 5.3 cm on axial image 16. This measures 7.7 cm in length on coronal image number 92.  There is an additional fluid collection in the lateral aspect of the left mid abdomen, measuring 8.3 x 4.2 cm on axial image number 31. This also has peripheral rim enhancement. This measures 9.8 cm in length on coronal image number 64.  There are dilated small bowel loops in the lower abdomen and upper pelvis. Above the urinary bladder, one of these loops contains fluid, small amount of oral contrast and some gas. No  definite separate fluid collection is seen.  A Foley catheter is in the urinary bladder with associated air in the bladder. No free peritoneal air seen. Atheromatous arterial calcifications, including the coronary arteries. Cholecystectomy clips.  Small left pleural effusion. Bilateral lower lobe atelectasis. Multiple colonic  diverticula. Small left inguinal hernia containing fat. Diffuse subcutaneous edema. Lumbar and lower thoracic spine degenerative changes and scoliosis.  IMPRESSION: 1. 7.7 x 7.7 x 5.3 cm fluid collection with rim enhancement beneath the left hemidiaphragm and partially surrounding the spleen. This is suspicious for an abscess. 2. 9.8 x 8.3 x 4.2 cm arm left mid abdominal fluid collection with peripheral rim enhancement, suspicious for an abscess. 3. Focal ileus or partial obstruction involving small bowel in the inferior abdomen and upper pelvis. 4. Small left pleural effusion. 5. Bilateral lower lobe atelectasis. 6. Colonic diverticulosis.   Electronically Signed   By: Beckie Salts M.D.   On: 04/02/2015 17:38   Ct Abdomen Pelvis W Contrast  03/18/2015   CLINICAL DATA:  Nausea and vomiting for 4 days, anxiety, status post hysterectomy CT pelvis 05/27/2014  EXAM: CT ABDOMEN AND PELVIS WITH CONTRAST  TECHNIQUE: Multidetector CT imaging of the abdomen and pelvis was performed using the standard protocol following bolus administration of intravenous contrast.  CONTRAST:  50mL OMNIPAQUE IOHEXOL 300 MG/ML SOLN, OMNIPAQUE IOHEXOL 300 MG/ML SOLN  COMPARISON:  None.  FINDINGS: Sagittal images of the spine shows diffuse osteopenia. Degenerative changes thoracolumbar spine. The lung bases are unremarkable.  The patient is status post cholecystectomy. Mild intrahepatic biliary ductal dilatation. There is CBD dilatation up to 1.1 cm. No focal hepatic mass. The pancreas, spleen and adrenal glands are unremarkable. Kidneys are symmetrical in size and enhancement. No hydronephrosis or hydroureter. Delayed renal images shows bilateral renal symmetrical excretion.  Atherosclerotic calcifications of abdominal aorta and iliac arteries. Moderate stool noted in right colon and proximal transverse colon. No colonic obstruction. The descending colon and sigmoid colon are empty collapsed. Some stool noted within rectum.  There are  fluid distended small bowel loops in mid upper abdomen and left lower abdomen and pelvis. Small amount of fluid/stranding noted a adjacent to small bowel loops in left lower quadrant see axial image 57. Distal small bowel is decreased caliber collapsed. Findings are consistent with small bowel obstruction.  Mild distended urinary bladder. The patient is status post hysterectomy. The terminal ileum is small caliber decompressed. Contrast material is noted within stomach. No any contrast material is noted within small bowel or colon.  IMPRESSION: 1. There are fluid distended small bowel loops with multiple air-fluid levels highly suspicious for small bowel obstruction. Distal small bowel is small caliber decompressed. Terminal ileum is small caliber. Small amount of fluid/stranding noted adjacent to small bowel loops in left lower quadrant please see images 49 and 57. 2. Moderate stool noted in right colon and proximal transverse colon. Descending colon and sigmoid colon are empty collapsed. 3. No pericecal inflammation. 4. No hydronephrosis or hydroureter. 5. Status post cholecystectomy. Mild intrahepatic biliary ductal dilatation. CBD dilatation up to 1.1 cm. 6. No hydronephrosis or hydroureter. 7. Status post hysterectomy. These results were called by telephone at the time of interpretation on 03/18/2015 at 8:59 am to Dr. Gerhard Munch , who verbally acknowledged these results.   Electronically Signed   By: Natasha Mead M.D.   On: 03/18/2015 08:59   US Renal  03/25/2015   CLINICAL DATA:  Acute renal failure.  EXAM: RENAL / URINARY TRACT ULTRASOUND COMPLETE  COMPARISON:  None.  FINDINGS: Right Kidney:  Length: 8.1 cm. Increased echogenicity consistent with chronic medical renal disease. No mass or hydronephrosis visualized.  Left Kidney:  Length: 9.3 cm. Increased echogenicity consistent chronic medical renal disease. No mass or hydronephrosis visualized.  Bladder:  Bladder decompressed by Foley catheter.   IMPRESSION: Bilateral echodense kidneys consistent chronic medical renal disease. No acute abnormality. No hydronephrosis or bladder distention.   Electronically Signed   By: Maisie Fus  Register   On: 03/25/2015 13:27   Dg Chest Port 1 View  03/27/2015   CLINICAL DATA:  Left PICC placement. Nausea and vomiting. Initial encounter.  EXAM: PORTABLE CHEST - 1 VIEW  COMPARISON:  Chest radiograph performed 03/26/2015  FINDINGS: The patient's left PICC is noted ending about the proximal SVC. An enteric tube is noted extending below the diaphragm.  The lungs are hypoexpanded. Bibasilar and right upper lung zone airspace opacities may reflect atelectasis or pneumonia. No pleural effusion or pneumothorax is seen  The cardiomediastinal silhouette is borderline normal in size. No acute osseous abnormalities are identified.  IMPRESSION: 1. Left PICC noted ending about the proximal SVC. 2. Lungs hypoexpanded. Bibasilar and right upper lung zone airspace opacities may reflect atelectasis or pneumonia.  These results were called by telephone at the time of interpretation on 03/27/2015 at 7:33 pm to Nursing in the [facility], who verbally acknowledged these results.   Electronically Signed   By: Roanna Raider M.D.   On: 03/27/2015 19:34   Dg Chest Port 1 View  03/26/2015   CLINICAL DATA:  Atelectasis  EXAM: PORTABLE CHEST - 1 VIEW  COMPARISON:  Yesterday  FINDINGS: NG tube and left PICC are stable. Upper normal heart size. Low volumes. Bibasilar atelectasis is not significantly changed. No pneumothorax.  IMPRESSION: Stable bibasilar atelectasis.   Electronically Signed   By: Jolaine Click M.D.   On: 03/26/2015 08:14   Dg Chest Port 1 View  03/25/2015   CLINICAL DATA:  Tachycardia and hypotension; concern for sepsis  EXAM: PORTABLE CHEST - 1 VIEW  COMPARISON:  May 27, 2014  FINDINGS: There is patchy atelectasis in both lung bases. The degree of inspiration is shallow. There is no frank airspace consolidation.  Heart is upper normal in size with pulmonary vascularity within normal limits.  Central catheter tip is in the superior vena cava. Nasogastric tube tip and side port are in the distal stomach. No pneumothorax.  IMPRESSION: Bibasilar atelectasis. No pneumothorax. Degree of inspiration shallow.   Electronically Signed   By: Bretta Bang III M.D.   On: 03/25/2015 09:03   Dg Abd Portable 1v  03/24/2015   CLINICAL DATA:  Small-bowel obstruction  EXAM: PORTABLE ABDOMEN - 1 VIEW  COMPARISON:  03/23/2015  FINDINGS: NG tube is been placed with the tip in the gastric antrum  Small bowel dilatation shows mild interval improvement. Colon is decompressed with moderate stool in the right colon. Surgical clips in the gallbladder fossa.  IMPRESSION: Mild improvement in small bowel obstruction. NG tube in the gastric antrum.   Electronically Signed   By: Marlan Palau M.D.   On: 03/24/2015 08:25   Dg Abd Portable 1v  03/23/2015   CLINICAL DATA:  Followup small bowel obstruction.  EXAM: PORTABLE ABDOMEN - 1 VIEW  COMPARISON:  03/22/2015  FINDINGS: Dilated loops small bowel are similar to the prior exam allowing for differences in radiographic technique different degrees of magnification.  There is a small amount of air in a nondistended colon.  IMPRESSION:  1. Persistent high-grade partial small bowel obstruction. No significant change from the previous day's study.   Electronically Signed   By: Amie Portland M.D.   On: 03/23/2015 10:01   Dg Abd Portable 1v  03/22/2015   CLINICAL DATA:  81 year old female with recent small bowel obstruction. Initial encounter.  EXAM: PORTABLE ABDOMEN - 1 VIEW  COMPARISON:  03/21/2015 and earlier, including CT Abdomen and Pelvis 03/18/2015.  FINDINGS: Portable AP supine view at 0517 hrs. Enteric tube has been removed. Recurrent dilated gas-filled small bowel loops in the mid abdomen up to 41 mm diameter. Abundant stool now at the splenic flexure. Decreased distal colon gas in the pelvis.  Stable cholecystectomy clips. Stable abdominal and pelvic visceral contours. Grossly stable lung bases. Stable visualized osseous structures.  IMPRESSION: NG tube removed with recurrent small bowel obstruction.   Electronically Signed   By: Odessa Fleming M.D.   On: 03/22/2015 07:42   Dg Abd Portable 1v  03/21/2015   CLINICAL DATA:  Small-bowel obstruction .  EXAM: PORTABLE ABDOMEN - 1 VIEW  COMPARISON:  03/20/2015 .  FINDINGS: NG tube noted with tip in the upper portion stomach. Slight advancement should be considered . Surgical clips right upper quadrant Soft tissue structures are unremarkable. Small-bowel distention has improved. No free air. Stool noted throughout the colon. No acute bony abnormality.  IMPRESSION: 1. NG tube noted with tip in the upper portion of the stomach. Advancement suggested.  2. Interim improvement of small bowel distention. Colonic gas pattern is normal with stool throughout the colon.  These results will be called to the ordering clinician or representative by the Radiologist Assistant, and communication documented in the PACS or zVision Dashboard.   Electronically Signed   By: Maisie Fus  Register   On: 03/21/2015 07:32   Dg Abd Portable 1v  03/20/2015   CLINICAL DATA:  Followup small bowel obstruction.  EXAM: PORTABLE ABDOMEN - 1 VIEW  COMPARISON:  03/19/2015  FINDINGS: Mild small bowel dilation persists most evident in the left upper quadrant. There has been no substantial change from the prior study. Residual contrast is noted in the bladder, also similar to the prior exam. Nasogastric tube tip projects in the distal stomach.  IMPRESSION: Small bowel dilation persists consistent with a persistent partial small bowel obstruction. No change from the previous day's study.   Electronically Signed   By: Amie Portland M.D.   On: 03/20/2015 07:29   Dg Abd Portable 1v  03/19/2015   CLINICAL DATA:  Small bowel obstruction protocol. Small bowel obstruction.  EXAM: PORTABLE ABDOMEN - 1 VIEW   COMPARISON:  03/18/2015.  FINDINGS: This is a 24 hr film. There is no contrast identified in the cecum. On the prior exam at 2046 hr yesterday, oral contrast was present in the stomach. Excreted contrast is present within the urinary bladder. Dilation of small bowel loops measures 36 mm. Cholecystectomy clips are present in the right upper quadrant.  There is less small bowel contrast than expected which may be due to dilution in the proximal small bowel. Nasogastric tube remains in the stomach with the tip at the antrum.  Large amount of stool is present in the colon.  IMPRESSION: Small bowel obstruction with no contrast identified in the cecum at 24 hr.   Electronically Signed   By: Andreas Newport M.D.   On: 03/19/2015 17:11   Dg Abd Portable 1v-small Bowel Obstruction Protocol-initial, 8 Hr Delay  03/19/2015   CLINICAL DATA:  Small bowel obstruction  EXAM: PORTABLE ABDOMEN - 1 VIEW  COMPARISON:  Radiographs and CT 03/18/2015  FINDINGS: Nasogastric tube extends into the distal stomach. There is contrast in the gastric lumen. There is no significant contrast in the small bowel or colon. Dilated stacked loops of small bowel persist in the mid abdomen, probably unchanged. No free air is evident on this single AP supine portable radiograph.  IMPRESSION: Enteric contrast has not reached the colon. Persistent small bowel dilatation consistent with small-bowel obstruction.   Electronically Signed   By: Ellery Plunk M.D.   On: 03/19/2015 01:48   Dg Abd Portable 1v-small Bowel Protocol-position Verification  03/18/2015   CLINICAL DATA:  Small bowel protocol, nasogastric tube placement  EXAM: PORTABLE ABDOMEN - 1 VIEW  COMPARISON:  Portable exam 1303 hours compared to CT abdomen and pelvis 03/18/2015  FINDINGS: Tip of nasogastric tube projects over distal gastric antrum or duodenal bulb, patient slightly rotated to the RIGHT.  Excreted contrast material within renal collecting systems and distended urinary  bladder.  Air-filled loops of dilated small bowel noted in the mid abdomen suspicious for small bowel obstruction.  Some gas and stool remain present within the colon.  Degenerative disc and facet disease changes thoracolumbar spine.  Osseous demineralization.  IMPRESSION: Tip of nasogastric tube projects over either the distal gastric antrum or the duodenal bulb region.  Dilated small bowel loops question small bowel obstruction.   Electronically Signed   By: Ulyses Southward M.D.   On: 03/18/2015 13:16   Dg Foot 2 Views Right  03/22/2015   CLINICAL DATA:  Dorsal right foot pain for 4 days, no known injury, initial encounter  EXAM: RIGHT FOOT - 2 VIEW  COMPARISON:  None.  FINDINGS: Degenerative changes are noted in the tarsal bones. No acute fracture or dislocation is noted. A small calcaneal spur is noted.  IMPRESSION: Degenerative change without acute abnormality.   Electronically Signed   By: Alcide Clever M.D.   On: 03/22/2015 14:57   Dg Swallowing Func-speech Pathology  04/01/2015    Objective Swallowing Evaluation:    Patient Details  Name: [name] MRN: [id number] Date of Birth: July 08, 1933  Today's Date: 04/01/2015 Time: SLP Start Time (ACUTE ONLY): 0835-SLP Stop Time (ACUTE ONLY): 0857 SLP Time Calculation (min) (ACUTE ONLY): 22 min  Past Medical History:  Past Medical History  Diagnosis Date  . CAD (coronary artery disease)     s/p stenting of LAD 1999- cath 5-08 EF normal LAD 30-40% restenosis. D1  50% D2 80% LCX & RCA minimal plaque  . HTN (hypertension)   . Hyperlipemia   . Anemia     iron deficiency  . Depression   . DVT (deep venous thrombosis)   . Gout   . Osteoporosis   . Pancreatitis   . GERD (gastroesophageal reflux disease)   . Renal insufficiency     Cr 1.2-1.3  . Obesity   . Diabetes mellitus   . Polyarthritis     DJD/ possible PMR  . Vitamin D deficiency   . B12 deficiency   . Tinnitus   . Anxiety   . Aneurysm, thoracic aortic   . Constipation   . Urinary frequency   . Vertigo   . Chronic  back pain    Past Surgical History:  Past Surgical History  Procedure Laterality Date  . Hemorrhoid surgery    . Abdominal hysterectomy    . Cholecystectomy    . Tubal ligation    . Coronary angioplasty with stent  placement  1999    LAD stent  . Cardiac catheterization  2008    L main 20%, LAD stent patent, D1 50%, D2 80% (small), RCA 20%, EF 55-60%   . Laparoscopy N/A 03/24/2015    Procedure: LAPAROSCOPY DIAGNOSTIC;  Surgeon: Luretha Murphy, MD;   Location: [facility];  Service: General;  Laterality: N/A;  . Laparoscopic lysis of adhesions N/A 03/24/2015    Procedure: LAPAROSCOPIC LYSIS OF ADHESIONS;  Surgeon: Luretha Murphy,  MD;  Location: [facility];  Service: General;  Laterality: N/A;  . Laparotomy N/A 03/24/2015    Procedure: LAPAROTOMY with decompression of bowel;  Surgeon: Luretha Murphy, MD;  Location: [facility];  Service: General;  Laterality: N/A;   HPI:  Other Pertinent Information: Pt is a 81 year old female admitted 03/18/15  with SBO secondary to adhesions. She failed conservative therapy and  underwent laparoscopic lysis of adhesions/laparotomy with bowel  decompression on 03/24/15. Her hospital course was complicated by sepsis  secondary to peritoneal contamination with transfer to the SDU on 03/25/15.  She also developed left-sided facial droop/right arm weakness 03/27/15 with  CT scan showing a 4 mm lacunar infarct in the left lentiform nucleus. MRI  failed to show any abnormality. After removal of NG for suction, MD  ordered MBS prior to initiating diet.   No Data Recorded  Assessment / Plan / Recommendation CHL IP CLINICAL IMPRESSIONS 04/01/2015  Therapy Diagnosis Mild pharyngeal phase dysphagia;Mild oral phase  dysphagia  Clinical Impression Mild oral dysphagia characterized by decreased lingual  formation of bolus, improved with straw sips and improving progressively  during exam. oropharyngeal dysphagia also mild with no penetration or  aspiration. Suspect mild standing secretions intially during exam as   pharyngeal residuals decreased and timing of swallow improved with each  trial. Though swallow was initially delayed, by end of exam pt taking  large consecutive sips of thin with minimal delay and minimal residuals.  Recommend pt initiate a dys 3 (mechanical soft) diet with basic aspiration  precautions of upright posture and occasional second swallow to clear oral  cavity and orophrayngeal residuals. SLP will f/u for tolerance during  acute stay.       CHL IP TREATMENT RECOMMENDATION 04/01/2015  Treatment Recommendations Therapy as outlined in treatment plan below     CHL IP DIET RECOMMENDATION 04/01/2015  SLP Diet Recommendations Dysphagia 3 (Mech soft);Thin  Liquid Administration via (None)  Medication Administration Whole meds with liquid  Compensations Multiple dry swallows after each bite/sip  Postural Changes and/or Swallow Maneuvers (None)     CHL IP OTHER RECOMMENDATIONS 04/01/2015  Recommended Consults (None)  Oral Care Recommendations Oral care BID  Other Recommendations (None)     CHL IP FOLLOW UP RECOMMENDATIONS 03/29/2015  Follow up Recommendations 24 hour supervision/assistance     CHL IP FREQUENCY AND DURATION 04/01/2015  Speech Therapy Frequency (ACUTE ONLY) min 2x/week  Treatment Duration 2 weeks     Pertinent Vitals/Pain NA    SLP Swallow Goals No flowsheet data found.  No flowsheet data found.    CHL IP REASON FOR REFERRAL 04/01/2015  Reason for Referral Objectively evaluate swallowing function     CHL IP ORAL PHASE 04/01/2015  Lips (None)  Tongue (None)  Mucous membranes (None)  Nutritional status (None)  Other (None)  Oxygen therapy (None)  Oral Phase Impaired  Oral - Pudding Teaspoon (None)  Oral - Pudding Cup (None)  Oral - Honey Teaspoon (None)  Oral - Honey Cup (None)  Oral -  Honey Syringe (None)  Oral - Nectar Teaspoon (None)  Oral - Nectar Cup (None)  Oral - Nectar Straw (None)  Oral - Nectar Syringe (None)  Oral - Ice Chips (None)  Oral - Thin Teaspoon (None)  Oral - Thin Cup (None)  Oral -  Thin Straw (None)  Oral - Thin Syringe (None)  Oral - Puree (None)  Oral - Mechanical Soft (None)  Oral - Regular (None)  Oral - Multi-consistency (None)  Oral - Pill (None)  Oral Phase - Comment (None)      CHL IP PHARYNGEAL PHASE 04/01/2015  Pharyngeal Phase Impaired  Pharyngeal - Pudding Teaspoon (None)  Penetration/Aspiration details (pudding teaspoon) (None)  Pharyngeal - Pudding Cup (None)  Penetration/Aspiration details (pudding cup) (None)  Pharyngeal - Honey Teaspoon (None)  Penetration/Aspiration details (honey teaspoon) (None)  Pharyngeal - Honey Cup (None)  Penetration/Aspiration details (honey cup) (None)  Pharyngeal - Honey Syringe (None)  Penetration/Aspiration details (honey syringe) (None)  Pharyngeal - Nectar Teaspoon (None)  Penetration/Aspiration details (nectar teaspoon) (None)  Pharyngeal - Nectar Cup (None)  Penetration/Aspiration details (nectar cup) (None)  Pharyngeal - Nectar Straw (None)  Penetration/Aspiration details (nectar straw) (None)  Pharyngeal - Nectar Syringe (None)  Penetration/Aspiration details (nectar syringe) (None)  Pharyngeal - Ice Chips (None)  Penetration/Aspiration details (ice chips) (None)  Pharyngeal - Thin Teaspoon (None)  Penetration/Aspiration details (thin teaspoon) (None)  Pharyngeal - Thin Cup (None)  Penetration/Aspiration details (thin cup) (None)  Pharyngeal - Thin Straw (None)  Penetration/Aspiration details (thin straw) (None)  Pharyngeal - Thin Syringe (None)  Penetration/Aspiration details (thin syringe') (None)  Pharyngeal - Puree (None)  Penetration/Aspiration details (puree) (None)  Pharyngeal - Mechanical Soft (None)  Penetration/Aspiration details (mechanical soft) (None)  Pharyngeal - Regular (None)  Penetration/Aspiration details (regular) (None)  Pharyngeal - Multi-consistency (None)  Penetration/Aspiration details (multi-consistency) (None)  Pharyngeal - Pill (None)  Penetration/Aspiration details (pill) (None)  Pharyngeal Comment (None)      No  flowsheet data found.  No flowsheet data found.        Harlon Ditty, [place] CCC-SLP [phone]  Claudine Mouton 04/01/2015, 10:31 AM    Ct Image Guided Drainage Percut Cath  Peritoneal Retroperit  04/04/2015   CLINICAL DATA:  81 year old with postoperative fluid collections. Concern for intra-abdominal abscesses.  EXAM: CT-GUIDED DRAIN PLACEMENT IN LEFT UPPER ABDOMINAL FLUID COLLECTION  CT-GUIDED DRAIN PLACEMENT IN LEFT LATERAL ABDOMINAL FLUID COLLECTION  Physician: Rachelle Hora. Lowella Dandy, MD  FLUOROSCOPY TIME:  None  MEDICATIONS: 4 mg versed, 100 mcg of fentanyl. A radiology nurse monitored the patient for moderate sedation.  ANESTHESIA/SEDATION: Moderate sedation time: 50 min  PROCEDURE: Informed consent was obtained for CT-guided drain placements. Patient was placed supine on the CT scanner. Images through the abdomen were obtained. The collections in the left upper abdomen and lateral left abdomen were identified. The left side of the abdomen was prepped and draped in sterile fashion. The collection in the left upper abdomen and perisplenic region was initially targeted. Skin was anesthetized with 1% lidocaine. 18 gauge needle was directed into the fluid collection just posterior to the spleen and cloudy yellow fluid was aspirated. A stiff Amplatz wire was placed and the tract was dilated to accommodate a 10.2 [place] multipurpose drain. Catheter sutured to skin and attached to a suction bulb.  Attention was directed to the left lateral abdominal fluid collection. The left lateral abdomen was anesthetized with 1% lidocaine. 18 gauge needle directed into the fluid collection and cloudy yellow fluid was aspirated. Stiff Amplatz  wire was placed and the tract was dilated to accommodate a 10.2 [place] multipurpose drain. Catheter sutured to skin and attached to a suction bulb.  FINDINGS: Left upper abdominal fluid collection in the perisplenic region. Drain was successfully placed just posterior to the spleen. There is a  second collection in the lateral left mid abdomen. Drain successfully placed in this collection.  Estimated blood loss: Minimal  COMPLICATIONS: None  IMPRESSION: Placement of CT-guided drainage catheters in two left abdominal fluid collections. Cloudy yellow fluid was removed from both collections. Samples were sent for culture.   Electronically Signed   By: Richarda Overlie M.D.   On: 04/04/2015 08:32   Ct Image Guided Drainage Percut Cath  Peritoneal Retroperit  04/04/2015   CLINICAL DATA:  80 year old with postoperative fluid collections. Concern for intra-abdominal abscesses.  EXAM: CT-GUIDED DRAIN PLACEMENT IN LEFT UPPER ABDOMINAL FLUID COLLECTION  CT-GUIDED DRAIN PLACEMENT IN LEFT LATERAL ABDOMINAL FLUID COLLECTION  Physician: Rachelle Hora. Lowella Dandy, MD  FLUOROSCOPY TIME:  None  MEDICATIONS: 4 mg versed, 100 mcg of fentanyl. A radiology nurse monitored the patient for moderate sedation.  ANESTHESIA/SEDATION: Moderate sedation time: 50 min  PROCEDURE: Informed consent was obtained for CT-guided drain placements. Patient was placed supine on the CT scanner. Images through the abdomen were obtained. The collections in the left upper abdomen and lateral left abdomen were identified. The left side of the abdomen was prepped and draped in sterile fashion. The collection in the left upper abdomen and perisplenic region was initially targeted. Skin was anesthetized with 1% lidocaine. 18 gauge needle was directed into the fluid collection just posterior to the spleen and cloudy yellow fluid was aspirated. A stiff Amplatz wire was placed and the tract was dilated to accommodate a 10.2 [place] multipurpose drain. Catheter sutured to skin and attached to a suction bulb.  Attention was directed to the left lateral abdominal fluid collection. The left lateral abdomen was anesthetized with 1% lidocaine. 18 gauge needle directed into the fluid collection and cloudy yellow fluid was aspirated. Stiff Amplatz wire was placed and the tract was  dilated to accommodate a 10.2 [place] multipurpose drain. Catheter sutured to skin and attached to a suction bulb.  FINDINGS: Left upper abdominal fluid collection in the perisplenic region. Drain was successfully placed just posterior to the spleen. There is a second collection in the lateral left mid abdomen. Drain successfully placed in this collection.  Estimated blood loss: Minimal  COMPLICATIONS: None  IMPRESSION: Placement of CT-guided drainage catheters in two left abdominal fluid collections. Cloudy yellow fluid was removed from both collections. Samples were sent for culture.   Electronically Signed   By: Richarda Overlie M.D.   On: 04/04/2015 08:32     Medical Consultants:    Surgery - Dr. Glenna Fellows   Cardiology   PCCM  Neurology - Dr. Thana Farr   Anti-Infectives:    Mefoxin 03/25/2015 --> 03/26/2015  Rocephin 03/26/2015 -->   Flagyl 03/26/2015 -->  Subjective:   [name] reports abd pain is improved.   Objective:    Filed Vitals:   04/06/15 1810 04/06/15 2013 04/07/15 0356 04/07/15 1350  BP:  167/75 159/65 132/75  Pulse:  104 105 102  Temp:  97.7 F (36.5 C) 97.7 F (36.5 C) 99.3 F (37.4 C)  TempSrc:  Oral Oral Oral  Resp:  20 20 19   Height:      Weight:      SpO2: 99% 99% 100% 100%    Intake/Output Summary (  Last 24 hours) at 04/07/15 1504 Last data filed at 04/07/15 0700  Gross per 24 hour  Intake    850 ml  Output   1583 ml  Net   -733 ml    Exam: Gen:  NAD Cardiovascular:  RRR, No M/R/G Respiratory:  Lungs CTAB Gastrointestinal:  Abdomen softly distended, tender, + BS Extremities:  1+ edema   Data Reviewed:    Labs: Basic Metabolic Panel:  Recent Labs Lab 04/01/15 0450 04/02/15 0537  04/03/15 0545 04/04/15 0650 04/05/15 1000 04/06/15 0445 04/07/15 0454  NA 135 135  --  132* 132* 132* 132* 135  K 5.1 5.8*  < > 4.9 4.7 4.5 4.8 5.0  CL 104 103  --  100* 103 103 102 104  CO2 24 25  --  24 21* 22 22 22   GLUCOSE  185* 163*  --  195* 174* 113* 153* 136*  BUN 53* 48*  --  45* 46* 46* 47* 46*  CREATININE 0.89 0.80  --  0.75 0.83 0.83 0.74 0.82  CALCIUM 8.0* 8.5*  --  8.3* 8.2* 8.3* 8.7* 8.9  MG 1.8 1.7  --   --  1.5* 2.1  --  1.6*  PHOS 4.7* 4.6  --   --  3.8 4.4  --  4.2  < > = values in this interval not displayed. GFR Estimated Creatinine Clearance: 52.6 mL/min (by C-G formula based on Cr of 0.82). Liver Function Tests:  Recent Labs Lab 04/04/15 0650 04/07/15 0454  AST 45* 43*  ALT 26 32  ALKPHOS 139* 204*  BILITOT 0.5 0.4  PROT 6.8 7.0  ALBUMIN 1.9* 2.0*   Coagulation profile  Recent Labs Lab 04/03/15 1140  INR 1.22    CBC:  Recent Labs Lab 04/02/15 0537 04/03/15 0545 04/04/15 0650 04/06/15 0445 04/07/15 0454  WBC 16.9* 16.9* 16.7* 14.3* 13.9*  NEUTROABS  --   --  15.3*  --  12.4*  HGB 9.1* 9.0* 8.8* 8.5* 7.8*  HCT 28.7* 29.3* 28.3* 27.4* 25.8*  MCV 82.0 81.6 83.2 82.8 82.4  PLT 364 370 465* 530* 506*   Cardiac Enzymes: No results for input(s): CKTOTAL, CKMB, CKMBINDEX, TROPONINI in the last 168 hours. CBG:  Recent Labs Lab 04/06/15 2009 04/07/15 0009 04/07/15 0355 04/07/15 0757 04/07/15 1207  GLUCAP 156* 106* 115* 220* 119*   Sepsis Labs:  Recent Labs Lab 04/03/15 0545 04/04/15 0650 04/06/15 0445 04/07/15 0454  WBC 16.9* 16.7* 14.3* 13.9*   Microbiology Recent Results (from the past 240 hour(s))  Culture, routine-abscess     Status: None   Collection Time: 04/03/15  3:18 PM  Result Value Ref Range Status   Specimen Description ABSCESS LEFT ABDOMEN UPPER  Final   Special Requests NONE  Final   Gram Stain   Final    MODERATE WBC PRESENT,BOTH PMN AND MONONUCLEAR NO SQUAMOUS EPITHELIAL CELLS SEEN NO ORGANISMS SEEN Performed at [facility]    Culture   Final    NO GROWTH 3 DAYS Performed at [facility]    Report Status 04/07/2015 FINAL  Final  Culture, routine-abscess     Status: None   Collection Time: 04/03/15  3:18 PM    Result Value Ref Range Status   Specimen Description ABSCESS ABDOMEN MID  Final   Special Requests NONE  Final   Gram Stain   Final    ABUNDANT WBC PRESENT,BOTH PMN AND MONONUCLEAR NO SQUAMOUS EPITHELIAL CELLS SEEN NO ORGANISMS SEEN Performed at [facility]  Culture   Final    FEW ENTEROCOCCUS SPECIES Performed at [facility]    Report Status 04/07/2015 FINAL  Final   Organism ID, Bacteria ENTEROCOCCUS SPECIES  Final      Susceptibility   Enterococcus species - MIC*    VANCOMYCIN 2 SENSITIVE Sensitive     AMPICILLIN <=2 SENSITIVE Sensitive     * FEW ENTEROCOCCUS SPECIES  Culture, blood (routine x 2)     Status: None (Preliminary result)   Collection Time: 04/06/15  4:40 PM  Result Value Ref Range Status   Specimen Description BLOOD RIGHT ARM  Final   Special Requests BOTTLES DRAWN AEROBIC AND ANAEROBIC 10CC  Final   Culture   Final           BLOOD CULTURE RECEIVED NO GROWTH TO DATE CULTURE WILL BE HELD FOR 5 DAYS BEFORE ISSUING A FINAL NEGATIVE REPORT Performed at [facility]    Report Status PENDING  Incomplete  Culture, blood (routine x 2)     Status: None (Preliminary result)   Collection Time: 04/06/15  4:50 PM  Result Value Ref Range Status   Specimen Description BLOOD RIGHT HAND  Final   Special Requests BOTTLES DRAWN AEROBIC AND ANAEROBIC 10CC  Final   Culture   Final           BLOOD CULTURE RECEIVED NO GROWTH TO DATE CULTURE WILL BE HELD FOR 5 DAYS BEFORE ISSUING A FINAL NEGATIVE REPORT Performed at [facility]    Report Status PENDING  Incomplete     Medications:   . antiseptic oral rinse  7 mL Mouth Rinse q12n4p  . aspirin  300 mg Rectal Daily   Or  . aspirin  325 mg Oral Daily  . bisacodyl  10 mg Rectal Daily  . chlorhexidine  15 mL Mouth Rinse BID  . docusate sodium  100 mg Oral BID  . feeding supplement (ENSURE ENLIVE)  237 mL Oral BID BM  . fluconazole (DIFLUCAN) IV  200 mg Intravenous Q24H  . insulin aspart   0-15 Units Subcutaneous 6 times per day  . lip balm  1 application Topical BID  . magnesium sulfate 1 - 4 g bolus IVPB  2 g Intravenous Once  . metoprolol  2.5 mg Intravenous 4 times per day   Continuous Infusions: . sodium chloride 10 mL/hr (03/29/15 0800)  . TPN (CLINIMIX) Adult without lytes 40 mL/hr at 04/06/15 1747   And  . fat emulsion 240 mL (04/06/15 1747)  . TPN (CLINIMIX) Adult without lytes     And  . fat emulsion      Time spent: 25 minutes.   LOS: 20 days   [facility]  [facility] Pager [phone]  If 7PM-7AM, please contact night-coverage at [email or web address], password [email or web address] for any overnight needs.  04/07/2015, 3:04 PM

## 2015-04-07 NOTE — Plan of Care (Signed)
Problem: Phase I Progression Outcomes Goal: Pain controlled with appropriate interventions Outcome: Not Progressing Patient continue to holler out in pain.

## 2015-04-07 NOTE — Progress Notes (Signed)
Speech Language Pathology Treatment: Dysphagia  Patient Details Name: [name] MRN: [id number] DOB: Aug 08, 1933 Today's Date: 04/07/2015 Time: 2929-0903 SLP Time Calculation (min) (ACUTE ONLY): 21 min  Assessment / Plan / Recommendation Clinical Impression  Pt reports her swallowing ability to be adequate but admits to poor intake due to pain.  She states "I'm tired" toward end of session.  When SLP asked her what she desired from this hospitalization, pt stated "to get well" and became tearful.  Encouraged pt to try to eat more for nutrition and health.  Phoned chaplain on call requesting spiritual support with pt permission.    Pt consumed a few small boluses of liquid supplement with timely swallow and clear voice and no indication of residuals nor airway compromise. Recommend to continue soft/thin diet encouraging intake.  Educated pt to recommendations to help maximize nutrition and comfort.   Will sign off at this time as all education completed.     HPI Other Pertinent Information: Pt is a 81 year old female admitted 03/18/15 with SBO secondary to adhesions. She failed conservative therapy and underwent laparoscopic lysis of adhesions/laparotomy with bowel decompression on 03/24/15. Her hospital course was complicated by sepsis secondary to peritoneal contamination with transfer to the SDU on 03/25/15. She also developed left-sided facial droop/right arm weakness 03/27/15 with CT scan showing a 4 mm lacunar infarct in the left lentiform nucleus. MRI failed to show any abnormality. After removal of NG for suction, MD ordered MBS prior to initiating diet.    Pertinent Vitals Pain Assessment: Faces Pain Score: 6  Pain Location: stitches location Pain Descriptors / Indicators: Moaning Pain Intervention(s): Limited activity within patient's tolerance;Patient requesting pain meds-RN notified;Repositioned (RN reports pt not eligible for pain medicine at this time)  SLP Plan  All goals met     Recommendations Diet recommendations: Dysphagia 3 (mechanical soft);Thin liquid Liquids provided via: Straw Medication Administration: Whole meds with liquid Supervision: Staff to assist with self feeding Compensations:  (intermittent dry swallow) Postural Changes and/or Swallow Maneuvers: Seated upright 90 degrees              Oral Care Recommendations: Oral care BID Follow up Recommendations: 24 hour supervision/assistance Plan: All goals met    [place], [place] [facility] SLP [phone]

## 2015-04-07 NOTE — Progress Notes (Signed)
Nutrition Follow-up  DOCUMENTATION CODES:  Non-severe (moderate) malnutrition in context of chronic illness  INTERVENTION: - Continue TPN per pharmacy - Continue Dysphagia 3 diet with Ensure BID and Magic Cup TID - RD to continue to follow for needs  NUTRITION DIAGNOSIS:  Malnutrition related to chronic illness as evidenced by moderate depletion of body fat, severe depletion of muscle mass. -ongoing  GOAL:  Patient will meet greater than or equal to 90% of their needs -variably met with PO intakes and current TPN regimen  MONITOR:  Wean from TPN and possible initiation of TF, Meal and supplement intakes, Weight trends, Labs, I/O's  ASSESSMENT: 81 year old female with history of hypertension, coronary artery disease, hyperlipidemia, iron deficiency anemia, depression, osteoporosis, mild chronic kidney disease presented to the ED with ongoing nausea for 4 days and diffuse crampy lower abdominal pain since one day prior to admission. Associated poor by mouth intake and generalized weakness. Patient was found to have small bowel obstruction.  5/23: - Attempting to wean TPN starting today - 0-10% of meals over the past 3 days - Receiving Clinimix 5/15 @ 70 mL/hr with 20% lipids @ 10 mL/hr at time of visit (84 grams protein, 1672 kcal)  5/26:  Calorie Count Note 48 hour calorie count ordered.  Diet: Dysphagia 3 with thin liquids Supplements: Ensure Enlive BID and Magic Cup TID  Breakfast: (5/26) 117 kcal, 7 grams protein (1/3 Ensure Enlive) Lunch: (5/25) 339 kcal, 21.4 grams protein (20% corn muffin, 200 mL Ensure Enlive) Dinner: tickets not recorded Supplements: as indicated above + 290 kcal and 9 grams protein from 100% intake of Magic Cup at 0550 today.  Total intake: 746 kcal (50% of minimum estimated needs)  37.4 grams protein (50% of minimum estimated needs)   Pt now POD #14. Today is the last day for calorie count follow up. Per pharmacy, plan to continue current  TPN regimen: Clinimix 5/15 @ 40 mL/hr with 20% lipids @ 10 mL/hr which is providing 1162 kcal, 48 grams protein. PO intake over the past 24 hours in addition to TPN: 1908 kcal, 85.4 grams protein which is meeting needs.  Per notes and discussion with pharmacist during rounds this AM, possible plan for PEG if PO intakes continue to remain low. He indicates that no medications have been ordered for thrush. Also noted no appetite stimulants ordered; may be beneficial especially if thrush is improving and PEG is not able to be placed.  Pt meeting needs with current regimen. Will continue to assess for needs for TF recommendations, PO intakes, and wean from TPN.   Medications reviewed. Labs reviewed: CBGs: 106-220 mg/dL, BUN elevated, Alk Phos and AST elevated, Mg: 1.6 mg/dL.  Height:  Ht Readings from Last 1 Encounters:  03/29/15 5' 4"  (1.626 m)    Weight:  Wt Readings from Last 1 Encounters:  03/27/15 149 lb 4 oz (67.7 kg)    Ideal Body Weight:  54.5 kg  Wt Readings from Last 10 Encounters:  03/27/15 149 lb 4 oz (67.7 kg)  12/31/14 145 lb (65.772 kg)  12/07/14 147 lb 8 oz (66.906 kg)  09/23/14 150 lb (68.04 kg)  08/19/14 150 lb (68.04 kg)  07/22/14 153 lb 12 oz (69.741 kg)  06/24/14 156 lb (70.761 kg)  05/26/14 162 lb (73.483 kg)  04/12/14 164 lb (74.39 kg)  03/14/14 172 lb (78.019 kg)    BMI:  Body mass index is 25.61 kg/(m^2).  Estimated Nutritional Needs:  Kcal:  1500-1700  Protein:  75-85g  Fluid:  1.5L/day  Skin:  Stage 2 pressure ulcer to buttocks, abdominal surgical wound   Diet Order:  DIET DYS 3 Room service appropriate?: Yes; Fluid consistency:: Thin TPN (CLINIMIX) Adult without lytes TPN (CLINIMIX) Adult without lytes  EDUCATION NEEDS:  No education needs identified at this time   Intake/Output Summary (Last 24 hours) at 04/07/15 1250 Last data filed at 04/07/15 0700  Gross per 24 hour  Intake   1060 ml  Output   1583 ml  Net   -523 ml    Last  BM:  5/23   Jarome Matin, RD, LDN Inpatient Clinical Dietitian Pager # [phone] After hours/weekend pager # [phone]

## 2015-04-07 NOTE — Progress Notes (Signed)
[facility] Progress Note  14 Days Post-Op  Subjective: Pt alert today, but c/o diffuse pain in her abdomen.  No N/V, tolerating minimal amounts of her diet.  No more fevers, WBC down again today.  Urinating through foley, last BM was 03/31/15.  Objective: Vital signs in last 24 hours: Temp:  [97.7 F (36.5 C)-98.7 F (37.1 C)] 97.7 F (36.5 C) (05/26 0356) Pulse Rate:  [102-105] 105 (05/26 0356) Resp:  [20] 20 (05/26 0356) BP: (148-167)/(65-76) 159/65 mmHg (05/26 0356) SpO2:  [99 %-100 %] 100 % (05/26 0356) Last BM Date: 03/31/15  Intake/Output from previous day: 05/25 0701 - 05/26 0700 In: 1190 [P.O.:440; I.V.:120; TPN:600] Out: 1608 [Urine:1550; Drains:58] Intake/Output this shift:    PE: Gen:  Alert, NAD, pleasant, but confused Abd: Soft, ND, diffusely tender throughout, greatest pain in left abdomen and RUQ, drains #1 & #2 with yellow clear drainage, +BS, no HSM, incisions C/D/I with staples in place over midline incision, few lap staple sites   Lab Results:   Recent Labs  04/06/15 0445 04/07/15 0454  WBC 14.3* 13.9*  HGB 8.5* 7.8*  HCT 27.4* 25.8*  PLT 530* 506*   BMET  Recent Labs  04/06/15 0445 04/07/15 0454  NA 132* 135  K 4.8 5.0  CL 102 104  CO2 22 22  GLUCOSE 153* 136*  BUN 47* 46*  CREATININE 0.74 0.82  CALCIUM 8.7* 8.9   PT/INR No results for input(s): LABPROT, INR in the last 72 hours. CMP     Component Value Date/Time   NA 135 04/07/2015 0454   K 5.0 04/07/2015 0454   CL 104 04/07/2015 0454   CO2 22 04/07/2015 0454   GLUCOSE 136* 04/07/2015 0454   BUN 46* 04/07/2015 0454   CREATININE 0.82 04/07/2015 0454   CALCIUM 8.9 04/07/2015 0454   PROT 7.0 04/07/2015 0454   ALBUMIN 2.0* 04/07/2015 0454   AST 43* 04/07/2015 0454   ALT 32 04/07/2015 0454   ALKPHOS 204* 04/07/2015 0454   BILITOT 0.4 04/07/2015 0454   GFRNONAA >60 04/07/2015 0454   GFRAA >60 04/07/2015 0454   Lipase     Component Value Date/Time   LIPASE  112* 04/07/2015 0454       Studies/Results: Dg Chest 2 View  04/06/2015   CLINICAL DATA:  Fever  EXAM: CHEST  2 VIEW  COMPARISON:  03/27/2015  FINDINGS: Cardiac shadow is mildly enlarged. Left basilar atelectasis is again identified. Two recently placed drainage catheters are noted in the left upper abdomen. Mild right basilar atelectasis is noted. A left-sided PICC line is noted with the tip at the junction of the innominate veins. This has withdrawn slightly in the interval from the prior exam. No acute bony abnormality is seen.  IMPRESSION: Bibasilar atelectasis. This has increased in the interval from the prior exam.  Left upper quadrant drainage catheters.   Electronically Signed   By: Inez Catalina M.D.   On: 04/06/2015 14:57    Anti-infectives: Anti-infectives    Start     Dose/Rate Route Frequency Ordered Stop   03/26/15 1600  cefTRIAXone (ROCEPHIN) 2 g in dextrose 5 % 50 mL IVPB - Premix    Comments:  Pharmacy may adjust dosing strength / duration / interval for maximal efficacy   2 g 100 mL/hr over 30 Minutes Intravenous Every 24 hours 03/26/15 1007 03/30/15 1535   03/26/15 1200  metroNIDAZOLE (FLAGYL) IVPB 500 mg     500 mg 100 mL/hr over 60 Minutes Intravenous Every  6 hours 03/26/15 1007 03/30/15 1850   03/25/15 1600  cefOXitin (MEFOXIN) 1 g in dextrose 5 % 50 mL IVPB  Status:  Discontinued     1 g 100 mL/hr over 30 Minutes Intravenous Every 12 hours 03/25/15 0801 03/26/15 1007   03/25/15 0800  cefOXitin (MEFOXIN) 1 g in dextrose 5 % 50 mL IVPB  Status:  Discontinued     1 g 100 mL/hr over 30 Minutes Intravenous 3 times per day 03/25/15 0752 03/25/15 0758   03/24/15 1700  cefOXitin (MEFOXIN) 1 g in dextrose 5 % 50 mL IVPB     1 g 100 mL/hr over 30 Minutes Intravenous Every 6 hours 03/24/15 1511 03/25/15 0512   03/24/15 0915  cefOXitin (MEFOXIN) 2 g in dextrose 5 % 50 mL IVPB    Comments:  Pharmacy may adjust dosing strength, interval, or rate of medication as needed for  optimal therapy for the patient Send with patient on call to the OR.  Anesthesia to complete antibiotic administration <64mn [facility]   2 g 100 mL/hr over 30 Minutes Intravenous On call to O.R. 03/24/15 0910 03/24/15 1042       Assessment/Plan POD #14, s/p lap converted to open LOA for SBO, Dr. MHassell Done-SLP following, On D3 diet, but appetite low -Mobilize as able s/p CVA 03/27/15 -Orals for pain, limit narcotics -TNP 1/2 dose to encourage appetite, may need feeding tube if she's not able to get enough calories, dietitian following -Abx: Rocephin/Flagyl D5/5 now discontinued -Pulmonary toilet -Surgical drain removed 03/27/15 -Remove staples today and replace with steristrips -New CT on 04/02/15 shows 2 intra-abdominal fluid collections, s/p IR drainage on 04/03/15 - left upper abdomen & Left lateral abdomen, pending cultures -No more fevers, Urine neg, CXR with atelectasis, Repeat CT once drainage subsides on 5/28, consider sooner if fevers return PCM/TPN -Continue TPN at 1/2 dose until PO intake improves, calorie count, dietitian Leukocytosis -down to 13.9, intra-abdominal fluid collections ABL Anemia -Hgb down to 7.8 yesterday, 2 units pRBC on 04/01/15 ?CVA -per neurology and primary   DVT prophylaxis -SCDs, heparin held for thrombocytopenia per primary service, on aspirin Hypomagnesemia -per primary service - may need supplementation Elevated alk phos and elevated lipase -Consider [facility] scan to check for cholecystitis, she does have RUQ abdominal pain  Anticipate SNF at discharge    LOS: 20 days    MNat Christen5/26/2016, 8:56 AM Pager: [phone]

## 2015-04-07 NOTE — Progress Notes (Signed)
ANTIBIOTIC CONSULT NOTE - INITIAL  Pharmacy Consult for Vancomycin Indication: Wound infection  Allergies  Allergen Reactions  . Amlodipine Besylate     REACTION: dizzy  . Aspirin   . Atenolol     REACTION: fatigue  . Benazepril     cough  . Benicar [Olmesartan Medoxomil]     HA  . Cozaar     nausea  . Hydrochlorothiazide W-Triamterene     REACTION: dizzy  . Hydrocodone     REACTION: HA  . Hydroxyzine Pamoate   . Iodine   . Lisinopril     REACTION: tired, cough  . Penicillins Itching    tolerates cephalosporins OK  . Pravastatin     myalgias  . Prednisolone     My stomach hurts  . Tramadol Hcl     REACTION: HA    Patient Measurements: Height: 5\' 4"  (162.6 cm) Weight: 149 lb 4 oz (67.7 kg) IBW/kg (Calculated) : 54.7  Vital Signs: Temp: 99.3 F (37.4 C) (05/26 1350) Temp Source: Oral (05/26 1350) BP: 132/75 mmHg (05/26 1350) Pulse Rate: 102 (05/26 1350) Intake/Output from previous day: 05/25 0701 - 05/26 0700 In: 1190 [P.O.:440; I.V.:120; TPN:600] Out: 1608 [Urine:1550; Drains:58] Intake/Output from this shift: Total I/O In: 75 [P.O.:75] Out: 2250 [Urine:2250]  Labs:  Recent Labs  04/05/15 1000 04/06/15 0445 04/07/15 0454  WBC  --  14.3* 13.9*  HGB  --  8.5* 7.8*  PLT  --  530* 506*  CREATININE 0.83 0.74 0.82   Estimated Creatinine Clearance: 52.6 mL/min (by C-G formula based on Cr of 0.82). No results for input(s): VANCOTROUGH, VANCOPEAK, VANCORANDOM, GENTTROUGH, GENTPEAK, GENTRANDOM, TOBRATROUGH, TOBRAPEAK, TOBRARND, AMIKACINPEAK, AMIKACINTROU, AMIKACIN in the last 72 hours.   Microbiology: Recent Results (from the past 720 hour(s))  MRSA PCR Screening     Status: None   Collection Time: 03/24/15  8:52 AM  Result Value Ref Range Status   MRSA by PCR NEGATIVE NEGATIVE Final    Comment:        The GeneXpert MRSA Assay (FDA approved for NASAL specimens only), is one component of a comprehensive MRSA colonization surveillance program. It  is not intended to diagnose MRSA infection nor to guide or monitor treatment for MRSA infections.   Culture, blood (x 2)     Status: None   Collection Time: 03/25/15  9:30 AM  Result Value Ref Range Status   Specimen Description BLOOD RIGHT HAND  Final   Special Requests BOTTLES DRAWN AEROBIC ONLY 5CC  Final   Culture   Final    NO GROWTH 5 DAYS Performed at 03/27/15    Report Status 03/31/2015 FINAL  Final  Culture, blood (x 2)     Status: None   Collection Time: 03/25/15  9:35 AM  Result Value Ref Range Status   Specimen Description BLOOD RIGHT ARM  Final   Special Requests BOTTLES DRAWN AEROBIC AND ANAEROBIC 6CC  Final   Culture   Final    NO GROWTH 5 DAYS Performed at 03/27/15    Report Status 03/31/2015 FINAL  Final  Culture, Urine     Status: None   Collection Time: 03/25/15 11:57 AM  Result Value Ref Range Status   Specimen Description URINE, CATHETERIZED  Final   Special Requests NONE  Final   Colony Count NO GROWTH Performed at 03/27/15   Final   Culture NO GROWTH Performed at [facility]   Final   Report Status 03/26/2015 FINAL  Final  Culture, routine-abscess     Status: None   Collection Time: 04/03/15  3:18 PM  Result Value Ref Range Status   Specimen Description ABSCESS LEFT ABDOMEN UPPER  Final   Special Requests NONE  Final   Gram Stain   Final    MODERATE WBC PRESENT,BOTH PMN AND MONONUCLEAR NO SQUAMOUS EPITHELIAL CELLS SEEN NO ORGANISMS SEEN Performed at [facility]    Culture   Final    NO GROWTH 3 DAYS Performed at [facility]    Report Status 04/07/2015 FINAL  Final  Culture, routine-abscess     Status: None   Collection Time: 04/03/15  3:18 PM  Result Value Ref Range Status   Specimen Description ABSCESS ABDOMEN MID  Final   Special Requests NONE  Final   Gram Stain   Final    ABUNDANT WBC PRESENT,BOTH PMN AND MONONUCLEAR NO SQUAMOUS EPITHELIAL CELLS SEEN NO ORGANISMS  SEEN Performed at [facility]    Culture   Final    FEW ENTEROCOCCUS SPECIES Performed at [facility]    Report Status 04/07/2015 FINAL  Final   Organism ID, Bacteria ENTEROCOCCUS SPECIES  Final      Susceptibility   Enterococcus species - MIC*    VANCOMYCIN 2 SENSITIVE Sensitive     AMPICILLIN <=2 SENSITIVE Sensitive     * FEW ENTEROCOCCUS SPECIES  Culture, Urine     Status: None (Preliminary result)   Collection Time: 04/06/15  3:27 PM  Result Value Ref Range Status   Specimen Description URINE, CATHETERIZED  Final   Special Requests NONE  Final   Culture   Final    Culture reincubated for better growth Performed at [facility]    Report Status PENDING  Incomplete  Culture, blood (routine x 2)     Status: None (Preliminary result)   Collection Time: 04/06/15  4:40 PM  Result Value Ref Range Status   Specimen Description BLOOD RIGHT ARM  Final   Special Requests BOTTLES DRAWN AEROBIC AND ANAEROBIC 10CC  Final   Culture   Final           BLOOD CULTURE RECEIVED NO GROWTH TO DATE CULTURE WILL BE HELD FOR 5 DAYS BEFORE ISSUING A FINAL NEGATIVE REPORT Performed at [facility]    Report Status PENDING  Incomplete  Culture, blood (routine x 2)     Status: None (Preliminary result)   Collection Time: 04/06/15  4:50 PM  Result Value Ref Range Status   Specimen Description BLOOD RIGHT HAND  Final   Special Requests BOTTLES DRAWN AEROBIC AND ANAEROBIC 10CC  Final   Culture   Final           BLOOD CULTURE RECEIVED NO GROWTH TO DATE CULTURE WILL BE HELD FOR 5 DAYS BEFORE ISSUING A FINAL NEGATIVE REPORT Performed at [facility]    Report Status PENDING  Incomplete    Medical History: Past Medical History  Diagnosis Date  . CAD (coronary artery disease)     s/p stenting of LAD 1999- cath 5-08 EF normal LAD 30-40% restenosis. D1 50% D2 80% LCX & RCA minimal plaque  . HTN (hypertension)   . Hyperlipemia   . Anemia     iron deficiency   . Depression   . DVT (deep venous thrombosis)   . Gout   . Osteoporosis   . Pancreatitis   . GERD (gastroesophageal reflux disease)   . Renal insufficiency  Cr 1.2-1.3  . Obesity   . Diabetes mellitus   . Polyarthritis     DJD/ possible PMR  . Vitamin D deficiency   . B12 deficiency   . Tinnitus   . Anxiety   . Aneurysm, thoracic aortic   . Constipation   . Urinary frequency   . Vertigo   . Chronic back pain     Medications:  Scheduled:  . antiseptic oral rinse  7 mL Mouth Rinse q12n4p  . aspirin  300 mg Rectal Daily   Or  . aspirin  325 mg Oral Daily  . bisacodyl  10 mg Rectal Daily  . chlorhexidine  15 mL Mouth Rinse BID  . docusate sodium  100 mg Oral BID  . feeding supplement (ENSURE ENLIVE)  237 mL Oral BID BM  . fluconazole (DIFLUCAN) IV  200 mg Intravenous Q24H  . insulin aspart  0-15 Units Subcutaneous 6 times per day  . lip balm  1 application Topical BID  . metoprolol  2.5 mg Intravenous 4 times per day   Infusions:  . sodium chloride 10 mL/hr (03/29/15 0800)  . TPN (CLINIMIX) Adult without lytes 40 mL/hr at 04/07/15 1730   And  . fat emulsion 240 mL (04/07/15 1730)   PRN: acetaminophen, alum & mag hydroxide-simeth, diphenhydrAMINE, fentaNYL (SUBLIMAZE) injection, hydrALAZINE, magic mouthwash, menthol-cetylpyridinium, metoprolol, [DISCONTINUED] ondansetron **OR** ondansetron (ZOFRAN) IV, oxyCODONE, phenol, polyethylene glycol, polyvinyl alcohol, promethazine, sodium chloride  Assessment: 81 yo female known to Pharmacy from TPN protocol for small bowel obstruction.  Consulted 5/26 to dose vancomycin for intra-abdominal abscess/fluid collection growing few Enterococcus species. Would prefer to use narrower-spectrum ampicillin but patient has a listed allergy to penicillin - reaction is itching.  Goal of Therapy:  Vancomycin trough level 15-20 mcg/ml  Plan:   Vancomycin 500mg  IV q12h Check trough at steady state Follow up renal function & cultures,  clinical course  , PharmD, BCPS Pager: [phone] 04/07/2015,6:55 PM

## 2015-04-07 NOTE — Progress Notes (Signed)
Patient ID: [name], female   DOB: [birth year], 81 y.o.   MRN: [id number]     Referring Physician(s): CCS  Subjective:  Pt seen.  Complains of "gas pain".  States she her abdomen is sore.  No N/V.  Allergies: Amlodipine besylate; Aspirin; Atenolol; Benazepril; Benicar; Cozaar; Hydrochlorothiazide w-triamterene; Hydrocodone; Hydroxyzine pamoate; Iodine; Lisinopril; Penicillins; Pravastatin; Prednisolone; and Tramadol hcl  Medications: Prior to Admission medications   Medication Sig Start Date End Date Taking? Authorizing Provider  acetaminophen (TYLENOL) 650 MG CR tablet Take 1,300 mg by mouth every 8 (eight) hours as needed for pain.    Yes Historical Provider, MD  aspirin 325 MG tablet Take 325 mg by mouth daily.   Yes Historical Provider, MD  cholecalciferol (VITAMIN D) 1000 UNITS tablet Take 2 tablets (2,000 Units total) by mouth daily. 04/17/13  Yes Aleksei Plotnikov V, MD  Cyanocobalamin (VITAMIN B-12) 1000 MCG SUBL Place 1 tablet (1,000 mcg total) under the tongue daily. 12/03/13  Yes Aleksei Plotnikov V, MD  furosemide (LASIX) 20 MG tablet Take 1 tablet (20 mg total) by mouth daily as needed for edema. Patient taking differently: Take 20 mg by mouth daily.  03/10/15 03/09/16 Yes Aleksei Plotnikov V, MD  metFORMIN (GLUCOPHAGE) 500 MG tablet TAKE 1 TABLET TWICE DAILY  WITH  A  MEAL 08/23/14  Yes Aleksei Plotnikov V, MD  OVER THE COUNTER MEDICATION Apply 1 application topically 2 (two) times daily as needed (dryness). Diabetic Lotion.   Yes Historical Provider, MD  oxybutynin (DITROPAN) 5 MG tablet Take 1 tablet (5 mg total) by mouth 2 (two) times daily. 11/25/14  Yes Aleksei Plotnikov V, MD  Polyethyl Glycol-Propyl Glycol (SYSTANE OP) Apply 1-2 drops to eye daily as needed (dry eyes).   Yes Historical Provider, MD  LORazepam (ATIVAN) 1 MG tablet Take 0.5 tablets (0.5 mg total) by mouth every 8 (eight) hours as needed for anxiety. Patient not taking: Reported on 03/18/2015 05/07/13    Bethann Berkshire, MD     Vital Signs: BP 159/65 mmHg  Pulse 105  Temp(Src) 97.7 F (36.5 C) (Oral)  Resp 20  Ht 5\' 4"  (1.626 m)  Wt 149 lb 4 oz (67.7 kg)  BMI 25.61 kg/m2  SpO2 100%  Physical Exam  Constitutional: She appears well-developed and well-nourished.  Abdominal: Soft.  Incision C/D/I.  Left drains in place. No erythema at drain site.   Less tender to palpation today.  Out put is still somewhat purulent but appears to be clearing up.  ~ 60 mls output.  Vitals reviewed.   Imaging: Dg Chest 2 View  04/06/2015   CLINICAL DATA:  Fever  EXAM: CHEST  2 VIEW  COMPARISON:  03/27/2015  FINDINGS: Cardiac shadow is mildly enlarged. Left basilar atelectasis is again identified. Two recently placed drainage catheters are noted in the left upper abdomen. Mild right basilar atelectasis is noted. A left-sided PICC line is noted with the tip at the junction of the innominate veins. This has withdrawn slightly in the interval from the prior exam. No acute bony abnormality is seen.  IMPRESSION: Bibasilar atelectasis. This has increased in the interval from the prior exam.  Left upper quadrant drainage catheters.   Electronically Signed   By: 03/29/2015 M.D.   On: 04/06/2015 14:57   Ct Image Guided Drainage Percut Cath  Peritoneal Retroperit  04/04/2015   CLINICAL DATA:  81 year old with postoperative fluid collections. Concern for intra-abdominal abscesses.  EXAM: CT-GUIDED DRAIN PLACEMENT IN LEFT UPPER ABDOMINAL FLUID COLLECTION  CT-GUIDED DRAIN PLACEMENT IN LEFT LATERAL ABDOMINAL FLUID COLLECTION  Physician: Rachelle Hora. Lowella Dandy, MD  FLUOROSCOPY TIME:  None  MEDICATIONS: 4 mg versed, 100 mcg of fentanyl. A radiology nurse monitored the patient for moderate sedation.  ANESTHESIA/SEDATION: Moderate sedation time: 50 min  PROCEDURE: Informed consent was obtained for CT-guided drain placements. Patient was placed supine on the CT scanner. Images through the abdomen were obtained. The collections in the  left upper abdomen and lateral left abdomen were identified. The left side of the abdomen was prepped and draped in sterile fashion. The collection in the left upper abdomen and perisplenic region was initially targeted. Skin was anesthetized with 1% lidocaine. 18 gauge needle was directed into the fluid collection just posterior to the spleen and cloudy yellow fluid was aspirated. A stiff Amplatz wire was placed and the tract was dilated to accommodate a 10.2 [place] multipurpose drain. Catheter sutured to skin and attached to a suction bulb.  Attention was directed to the left lateral abdominal fluid collection. The left lateral abdomen was anesthetized with 1% lidocaine. 18 gauge needle directed into the fluid collection and cloudy yellow fluid was aspirated. Stiff Amplatz wire was placed and the tract was dilated to accommodate a 10.2 [place] multipurpose drain. Catheter sutured to skin and attached to a suction bulb.  FINDINGS: Left upper abdominal fluid collection in the perisplenic region. Drain was successfully placed just posterior to the spleen. There is a second collection in the lateral left mid abdomen. Drain successfully placed in this collection.  Estimated blood loss: Minimal  COMPLICATIONS: None  IMPRESSION: Placement of CT-guided drainage catheters in two left abdominal fluid collections. Cloudy yellow fluid was removed from both collections. Samples were sent for culture.   Electronically Signed   By: Richarda Overlie M.D.   On: 04/04/2015 08:32   Ct Image Guided Drainage Percut Cath  Peritoneal Retroperit  04/04/2015   CLINICAL DATA:  81 year old with postoperative fluid collections. Concern for intra-abdominal abscesses.  EXAM: CT-GUIDED DRAIN PLACEMENT IN LEFT UPPER ABDOMINAL FLUID COLLECTION  CT-GUIDED DRAIN PLACEMENT IN LEFT LATERAL ABDOMINAL FLUID COLLECTION  Physician: Rachelle Hora. Lowella Dandy, MD  FLUOROSCOPY TIME:  None  MEDICATIONS: 4 mg versed, 100 mcg of fentanyl. A radiology nurse monitored the  patient for moderate sedation.  ANESTHESIA/SEDATION: Moderate sedation time: 50 min  PROCEDURE: Informed consent was obtained for CT-guided drain placements. Patient was placed supine on the CT scanner. Images through the abdomen were obtained. The collections in the left upper abdomen and lateral left abdomen were identified. The left side of the abdomen was prepped and draped in sterile fashion. The collection in the left upper abdomen and perisplenic region was initially targeted. Skin was anesthetized with 1% lidocaine. 18 gauge needle was directed into the fluid collection just posterior to the spleen and cloudy yellow fluid was aspirated. A stiff Amplatz wire was placed and the tract was dilated to accommodate a 10.2 [place] multipurpose drain. Catheter sutured to skin and attached to a suction bulb.  Attention was directed to the left lateral abdominal fluid collection. The left lateral abdomen was anesthetized with 1% lidocaine. 18 gauge needle directed into the fluid collection and cloudy yellow fluid was aspirated. Stiff Amplatz wire was placed and the tract was dilated to accommodate a 10.2 [place] multipurpose drain. Catheter sutured to skin and attached to a suction bulb.  FINDINGS: Left upper abdominal fluid collection in the perisplenic region. Drain was successfully placed just posterior to the spleen. There is a second collection in  the lateral left mid abdomen. Drain successfully placed in this collection.  Estimated blood loss: Minimal  COMPLICATIONS: None  IMPRESSION: Placement of CT-guided drainage catheters in two left abdominal fluid collections. Cloudy yellow fluid was removed from both collections. Samples were sent for culture.   Electronically Signed   By: Richarda Overlie M.D.   On: 04/04/2015 08:32    Labs:  CBC:  Recent Labs  04/03/15 0545 04/04/15 0650 04/06/15 0445 04/07/15 0454  WBC 16.9* 16.7* 14.3* 13.9*  HGB 9.0* 8.8* 8.5* 7.8*  HCT 29.3* 28.3* 27.4* 25.8*  PLT 370 465*  530* 506*    COAGS:  Recent Labs  03/27/15 2348 04/03/15 1140  INR 1.06 1.22  APTT 40*  --     BMP:  Recent Labs  04/04/15 0650 04/05/15 1000 04/06/15 0445 04/07/15 0454  NA 132* 132* 132* 135  K 4.7 4.5 4.8 5.0  CL 103 103 102 104  CO2 21* 22 22 22   GLUCOSE 174* 113* 153* 136*  BUN 46* 46* 47* 46*  CALCIUM 8.2* 8.3* 8.7* 8.9  CREATININE 0.83 0.83 0.74 0.82  GFRNONAA >60 >60 >60 >60  GFRAA >60 >60 >60 >60    LIVER FUNCTION TESTS:  Recent Labs  03/29/15 0528 03/31/15 0350 04/04/15 0650 04/07/15 0454  BILITOT 0.4 0.4 0.5 0.4  AST 33 26 45* 43*  ALT 18 14 26  32  ALKPHOS 98 98 139* 204*  PROT 5.6* 5.9* 6.8 7.0  ALBUMIN 1.7* 1.8* 1.9* 2.0*    Assessment and Plan:  S/p LOA for SBO 5/13 Drainage of left upper/lateral abd fluid collections 5/22 WBC's trending down Blood cx's neg to date Afebrile Will hold off on f/u CT for now and continue drains Other plans as per CCS/IM   Signed6/13 R 04/07/2015, 9:51 AM   I spent a total of 15 Minutes in face to face in clinical consultation/evaluation, greater than 50% of which was counseling/coordinating care for S/P Perc Drain placement.

## 2015-04-07 NOTE — Progress Notes (Signed)
Remove 13 staples from abdomen. Patient tolerated well.. Patient medicated before removal. Steri stripes applied.

## 2015-04-08 ENCOUNTER — Encounter (HOSPITAL_COMMUNITY): Payer: Self-pay | Admitting: Radiology

## 2015-04-08 ENCOUNTER — Inpatient Hospital Stay (HOSPITAL_COMMUNITY): Payer: Commercial Managed Care - HMO

## 2015-04-08 DIAGNOSIS — IMO0002 Reserved for concepts with insufficient information to code with codable children: Secondary | ICD-10-CM | POA: Diagnosis present

## 2015-04-08 DIAGNOSIS — B952 Enterococcus as the cause of diseases classified elsewhere: Secondary | ICD-10-CM

## 2015-04-08 DIAGNOSIS — K651 Peritoneal abscess: Secondary | ICD-10-CM

## 2015-04-08 LAB — CBC
HCT: 27.5 % — ABNORMAL LOW (ref 36.0–46.0)
Hemoglobin: 8.5 g/dL — ABNORMAL LOW (ref 12.0–15.0)
MCH: 25.6 pg — ABNORMAL LOW (ref 26.0–34.0)
MCHC: 30.9 g/dL (ref 30.0–36.0)
MCV: 82.8 fL (ref 78.0–100.0)
Platelets: 605 10*3/uL — ABNORMAL HIGH (ref 150–400)
RBC: 3.32 MIL/uL — ABNORMAL LOW (ref 3.87–5.11)
RDW: 20.8 % — ABNORMAL HIGH (ref 11.5–15.5)
WBC: 11.8 10*3/uL — ABNORMAL HIGH (ref 4.0–10.5)

## 2015-04-08 LAB — GLUCOSE, CAPILLARY
GLUCOSE-CAPILLARY: 120 mg/dL — AB (ref 65–99)
Glucose-Capillary: 111 mg/dL — ABNORMAL HIGH (ref 65–99)
Glucose-Capillary: 115 mg/dL — ABNORMAL HIGH (ref 65–99)
Glucose-Capillary: 141 mg/dL — ABNORMAL HIGH (ref 65–99)
Glucose-Capillary: 151 mg/dL — ABNORMAL HIGH (ref 65–99)
Glucose-Capillary: 97 mg/dL (ref 65–99)

## 2015-04-08 LAB — BASIC METABOLIC PANEL
Anion gap: 8 (ref 5–15)
BUN: 43 mg/dL — ABNORMAL HIGH (ref 6–20)
CHLORIDE: 104 mmol/L (ref 101–111)
CO2: 21 mmol/L — AB (ref 22–32)
CREATININE: 0.77 mg/dL (ref 0.44–1.00)
Calcium: 8.9 mg/dL (ref 8.9–10.3)
GFR calc Af Amer: >60 mL/min (ref >60)
GFR calc non Af Amer: >60 mL/min (ref >60)
Glucose, Bld: 121 mg/dL — ABNORMAL HIGH (ref 65–99)
Potassium: 4.6 mmol/L (ref 3.5–5.1)
Sodium: 133 mmol/L — ABNORMAL LOW (ref 135–145)

## 2015-04-08 LAB — MAGNESIUM: Magnesium: 1.9 mg/dL (ref 1.7–2.4)

## 2015-04-08 MED ORDER — TRACE MINERALS CR-CU-MN-SE-ZN 10-1000-500-60 MCG/ML IV SOLN
INTRAVENOUS | Status: AC
  Administered 2015-04-08: 18:00:00 via INTRAVENOUS
  Filled 2015-04-08: qty 960

## 2015-04-08 MED ORDER — IOHEXOL 300 MG/ML  SOLN
100.0000 mL | Freq: Once | INTRAMUSCULAR | Status: AC | PRN
  Administered 2015-04-08: 100 mL via INTRAVENOUS

## 2015-04-08 MED ORDER — POLYETHYLENE GLYCOL 3350 17 G PO PACK
17.0000 g | PACK | Freq: Every day | ORAL | Status: DC
  Administered 2015-04-08 – 2015-04-10 (×3): 17 g via ORAL
  Filled 2015-04-08 (×3): qty 1

## 2015-04-08 MED ORDER — IOHEXOL 300 MG/ML  SOLN: 25.0000 mL | INTRAMUSCULAR | Status: DC

## 2015-04-08 MED ORDER — FAT EMULSION 20 % IV EMUL
240.0000 mL | INTRAVENOUS | Status: AC
  Administered 2015-04-08: 240 mL via INTRAVENOUS
  Filled 2015-04-08: qty 250

## 2015-04-08 NOTE — Progress Notes (Signed)
PARENTERAL NUTRITION CONSULT NOTE - follow up  Pharmacy Consult for TPN Indication: bowel obstruction  Allergies  Allergen Reactions  . Amlodipine Besylate     REACTION: dizzy  . Aspirin   . Atenolol     REACTION: fatigue  . Benazepril     cough  . Benicar [Olmesartan Medoxomil]     HA  . Cozaar     nausea  . Hydrochlorothiazide W-Triamterene     REACTION: dizzy  . Hydrocodone     REACTION: HA  . Hydroxyzine Pamoate   . Iodine   . Lisinopril     REACTION: tired, cough  . Penicillins Itching    tolerates cephalosporins OK  . Pravastatin     myalgias  . Prednisolone     My stomach hurts  . Tramadol Hcl     REACTION: HA    Patient Measurements: Height: _0  (162.6 cm) Weight: 149 lb 4 oz (67.7 kg) IBW/kg (Calculated) : 54.7 Adjusted Body Weight: 57.5kg   Vital Signs: Temp: 98.4 F (36.9 C) (05/27 0444) Temp Source: Oral (05/27 0444) BP: 144/75 mmHg (05/27 0444) Pulse Rate: 99 (05/27 0444) Intake/Output from previous day: 05/26 0701 - 05/27 0700 In: 85 [P.O.:75] Out: 2268 [Urine:2253; Drains:15] Intake/Output from this shift:    Labs:  Recent Labs  04/06/15 0445 04/07/15 0454  WBC 14.3* 13.9*  HGB 8.5* 7.8*  HCT 27.4* 25.8*  PLT 530* 506*     Recent Labs  04/05/15 1000 04/06/15 0445 04/07/15 0454 04/08/15 0430  NA 132* 132* 135 133*  K 4.5 4.8 5.0 4.6  CL 103 102 104 104  CO2 _1 21*  GLUCOSE 113* 153* 136* 121*  BUN 46* 47* 46* 43*  CREATININE 0.83 0.74 0.82 0.77  CALCIUM 8.3* 8.7* 8.9 8.9  MG 2.1  --  1.6* 1.9  PHOS 4.4  --  4.2  --   PROT  --   --  7.0  --   ALBUMIN  --   --  2.0*  --   AST  --   --  43*  --   ALT  --   --  32  --   ALKPHOS  --   --  204*  --   BILITOT  --   --  0.4  --    Estimated Creatinine Clearance: 53.9 mL/min (by C-G formula based on Cr of 0.77).    Recent Labs  04/08/15 0021 04/08/15 0421 04/08/15 0734  GLUCAP 97 115* 111*    Insulin Requirements: 14 units Novolog on 5/25  Current  Nutrition:  Dysphagia 3 diet (start 5/20) Ensure BID ordered on 5/20 (took both cans yesterday)  IVF: NS @ 10 ml/hr  Central access: PICC 5/11 TPN start date: 5/11  ASSESSMENT                                                                                                          HPI: 81 year old female with history of hypertension, coronary artery disease, hyperlipidemia, iron deficiency anemia, depression, osteoporosis, mild chronic kidney disease  presented to the ED with ongoing nausea for 4 days and diffuse crampy lower abdominal pain since one day prior to admission. Associated poor by mouth intake and generalized weakness. Patient was found to have small bowel obstruction.  Pharmacy consulted to start TPN.  Significant events:  5/12: laparoscopic lysis of adhesions with laparotomy and decompression of bowel 5/13: new AKI 5/15: Continue NG tube and NPO d/t bilious output pending BM.   5/19: Remove NGT 5/20 Completed swallow eval - starting dysphagia diet 5/21: TPN changed to electrolytes free d/t high K+; given kayexalate.  Ate about 10% of lunch 5/22: IR drainage of 2 abd abscesses, drains placed 5/24: Thrush noted.   5/25: Per RN, TPN found still running at 50 ml/hr this AM, notified RPH and changed rate to 40 ml/hr around 0830. 5/27: Minor improvements in appetite; surgery considering PEG if not improving soon   Today:   Glucose (goal <150) - Most daytime CBGs > 150, however overnight CBGs wnl  Electrolytes - Na borderline low.  K, CorrCa borderline high. Mg wnl after replacement.  Phos wnl  Renal -  SCr improved to wnl; CrCl 54  WBC elevated; hgb stable at 9  LFTs - Alk phos continues to rise; slightly elevated.  Others wnl (5/23)  TGs - wnl (5/23)  Prealbumin: greatly improved 2.7 > 13.7 (5/23)  NUTRITIONAL GOALS                                                                                             RD recs (5/18): 75-85 g/day protein, 1500-1700  Kcal/day. Clinimix E 5/15 at a goal rate of 7m/hr + 20% fat emulsion at 134mhr to provide: 84 g/day protein, 1672 Kcal/day.  PLAN                                                                                                                         1) ) At 1800 today:  Continue Clinimix 5/15 (without lytes) at 40 ml/hr.  Will leave as plain formula for now with K/Ca stable, no longer rising, and PO nutr.  Continue 20% fat emulsion at 1075mr.   TPN to contain standard multivitamins and trace elements.  Mg 2g IV x 1  [place].  Will hold off on increasing to resistant SSI; Recommend adding low-dose Levemir or NPH for daytime coverage.  No insulin in TPN to avoid overnight lows.  TPN lab panels on Mondays & Thursdays.  Lytes stable.  May consider checking Mg over the weekend  F/u LFTs  F/u Surgery plans for continued TPN vs  PEG placement   Reuel Boom, PharmD Pager: [phone] 04/08/2015, 8:31 AM

## 2015-04-08 NOTE — Progress Notes (Signed)
[facility] Progress Note  15 Days Post-Op  Subjective: Still c/o pain in abdomen.  No N/V, tolerating some PO but anorexic.  No BM since 5/19.    Objective: Vital signs in last 24 hours: Temp:  [98.4 F (36.9 C)-99.3 F (37.4 C)] 98.4 F (36.9 C) (05/27 0444) Pulse Rate:  [96-106] 99 (05/27 0444) Resp:  [18-19] 18 (05/27 0444) BP: (132-173)/(66-76) 144/75 mmHg (05/27 0444) SpO2:  [98 %-100 %] 98 % (05/27 0444) Last BM Date: 03/31/15  Intake/Output from previous day: 05/26 0701 - 05/27 0700 In: 33 [P.O.:75] Out: 2268 [Urine:2253; Drains:15] Intake/Output this shift:    PE: Gen:  Alert, NAD, pleasant Abd: Soft, ND, diffusely tender throughout, greatest pain in left abdomen and RUQ, drain #1 with yellow clear drainage, Drain #2 with cloudy yellow fluid, +BS, no HSM, staples have been removed, steri-strips applied  Lab Results:   Recent Labs  04/06/15 0445 04/07/15 0454  WBC 14.3* 13.9*  HGB 8.5* 7.8*  HCT 27.4* 25.8*  PLT 530* 506*   BMET  Recent Labs  04/07/15 0454 04/08/15 0430  NA 135 133*  K 5.0 4.6  CL 104 104  CO2 22 21*  GLUCOSE 136* 121*  BUN 46* 43*  CREATININE 0.82 0.77  CALCIUM 8.9 8.9   PT/INR No results for input(s): LABPROT, INR in the last 72 hours. CMP     Component Value Date/Time   NA 133* 04/08/2015 0430   K 4.6 04/08/2015 0430   CL 104 04/08/2015 0430   CO2 21* 04/08/2015 0430   GLUCOSE 121* 04/08/2015 0430   BUN 43* 04/08/2015 0430   CREATININE 0.77 04/08/2015 0430   CALCIUM 8.9 04/08/2015 0430   PROT 7.0 04/07/2015 0454   ALBUMIN 2.0* 04/07/2015 0454   AST 43* 04/07/2015 0454   ALT 32 04/07/2015 0454   ALKPHOS 204* 04/07/2015 0454   BILITOT 0.4 04/07/2015 0454   GFRNONAA >60 04/08/2015 0430   GFRAA >60 04/08/2015 0430   Lipase     Component Value Date/Time   LIPASE 112* 04/07/2015 0454       Studies/Results: Dg Chest 2 View  04/06/2015   CLINICAL DATA:  Fever  EXAM: CHEST  2 VIEW  COMPARISON:   03/27/2015  FINDINGS: Cardiac shadow is mildly enlarged. Left basilar atelectasis is again identified. Two recently placed drainage catheters are noted in the left upper abdomen. Mild right basilar atelectasis is noted. A left-sided PICC line is noted with the tip at the junction of the innominate veins. This has withdrawn slightly in the interval from the prior exam. No acute bony abnormality is seen.  IMPRESSION: Bibasilar atelectasis. This has increased in the interval from the prior exam.  Left upper quadrant drainage catheters.   Electronically Signed   By: Inez Catalina M.D.   On: 04/06/2015 14:57    Anti-infectives: Anti-infectives    Start     Dose/Rate Route Frequency Ordered Stop   04/07/15 2000  vancomycin (VANCOCIN) 500 mg in sodium chloride 0.9 % 100 mL IVPB     500 mg 100 mL/hr over 60 Minutes Intravenous Every 12 hours 04/07/15 1859     04/07/15 1200  fluconazole (DIFLUCAN) IVPB 200 mg     200 mg 100 mL/hr over 60 Minutes Intravenous Every 24 hours 04/07/15 1120     03/26/15 1600  cefTRIAXone (ROCEPHIN) 2 g in dextrose 5 % 50 mL IVPB - Premix    Comments:  Pharmacy may adjust dosing strength / duration / interval for  maximal efficacy   2 g 100 mL/hr over 30 Minutes Intravenous Every 24 hours 03/26/15 1007 03/30/15 1535   03/26/15 1200  metroNIDAZOLE (FLAGYL) IVPB 500 mg     500 mg 100 mL/hr over 60 Minutes Intravenous Every 6 hours 03/26/15 1007 03/30/15 1850   03/25/15 1600  cefOXitin (MEFOXIN) 1 g in dextrose 5 % 50 mL IVPB  Status:  Discontinued     1 g 100 mL/hr over 30 Minutes Intravenous Every 12 hours 03/25/15 0801 03/26/15 1007   03/25/15 0800  cefOXitin (MEFOXIN) 1 g in dextrose 5 % 50 mL IVPB  Status:  Discontinued     1 g 100 mL/hr over 30 Minutes Intravenous 3 times per day 03/25/15 0752 03/25/15 0758   03/24/15 1700  cefOXitin (MEFOXIN) 1 g in dextrose 5 % 50 mL IVPB     1 g 100 mL/hr over 30 Minutes Intravenous Every 6 hours 03/24/15 1511 03/25/15 0512    03/24/15 0915  cefOXitin (MEFOXIN) 2 g in dextrose 5 % 50 mL IVPB    Comments:  Pharmacy may adjust dosing strength, interval, or rate of medication as needed for optimal therapy for the patient Send with patient on call to the OR.  Anesthesia to complete antibiotic administration <11mn [facility]   2 g 100 mL/hr over 30 Minutes Intravenous On call to O.R. 03/24/15 0910 03/24/15 1042       Assessment/Plan POD #15, s/p lap converted to open LOA for SBO, Dr. MHassell Done-SLP following, On D3 diet, but appetite low -Mobilize as able s/p CVA 03/27/15 -Orals for pain, limit narcotics -TNP 1/2 dose to encourage appetite, may need feeding tube if she's not able to get enough calories, dietitian following -Abx: Rocephin/Flagyl D5/5 now discontinued -Pulmonary toilet -Surgical drain removed 03/27/15 -Remove staples 04/07/15 -New CT on 04/02/15 shows 2 intra-abdominal fluid collections, s/p IR drainage on 04/03/15 - left upper abdomen & Left lateral abdomen, cultures show few enterococcus.  May need to ask ID's opinion about restarting antibiotics or not. -No more fevers, Urine neg, CXR with atelectasis, Repeat CT once drainage tomorrow on 5/28 -Add miralax and cont colace and dulcolax PCM/TPN -Continue TPN at 1/2 dose until PO intake improves,  await dietitian evaluation of calorie count Leukocytosis -down to 13.9 yesterday, intra-abdominal fluid collections ABL Anemia -Hgb down to 7.8 yesterday, 2 units pRBC on 04/01/15 ?CVA -per neurology and primary DVT prophylaxis -SCDs, heparin held for thrombocytopenia per primary service, on aspirin Hypomagnesemia -per primary service - may need supplementation Elevated alk phos and elevated lipase -CT scan tomorrow to also look for cholecystitis, she does have RUQ abdominal pain  Anticipate SNF at discharge    LOS: 21 days    MNat Christen5/27/2016, 7:32 AM Pager: [phone]

## 2015-04-08 NOTE — Consult Note (Addendum)
[facility]     Reason for Consult: Enterococcus in fluid collection    Referring Physician: Dr. Blake Divine  Principal Problem:   Small bowel obstruction s/p exlap/LOA/decompression 03/25/2015 Active Problems:   Diabetes type 2, controlled   Anxiety state   Adjustment disorder with mixed anxiety and depressed mood   Coronary atherosclerosis   GERD   Abdominal pain   Polymyalgia rheumatica   Chronic fatigue disorder   Knee pain, bilateral   Elevated blood pressure   Malnutrition of moderate degree   Acute delirium   Facial droop   AKI (acute kidney injury)   Sepsis   Stroke   Abscess of abdominal cavity   Pressure ulcer   Abdominal abscess   . antiseptic oral rinse  7 mL Mouth Rinse q12n4p  . aspirin  300 mg Rectal Daily   Or  . aspirin  325 mg Oral Daily  . bisacodyl  10 mg Rectal Daily  . chlorhexidine  15 mL Mouth Rinse BID  . docusate sodium  100 mg Oral BID  . feeding supplement (ENSURE ENLIVE)  237 mL Oral BID BM  . fluconazole (DIFLUCAN) IV  200 mg Intravenous Q24H  . insulin aspart  0-15 Units Subcutaneous 6 times per day  . lip balm  1 application Topical BID  . metoprolol  2.5 mg Intravenous 4 times per day  . polyethylene glycol  17 g Oral Daily  . vancomycin  500 mg Intravenous Q12H    Recommendations: If she remains afebrile tomorrow, can stop vancomyin I will stop fluconazole (yeast noted in UA but not significant)  Assessment: She has a fluid collection in the abdomen but not pus, does have Enterococcus but draining well and does have abdominal pain but is soft.    CT abdomen personally reviewed.  OR reports reviewed  Antibiotics: Vancomycin day 2 Fluconazole day 2  HPI: [name] is a 81 y.o. female with HTN, CAD, hx of cholecystectomy, hysterectomy, tubal ligation who presented 5/6 with n/v and abdominal pain. CT revealed SBO and started conservative treatment but 5/12 taken to OR for open decompressive  enterotomy and lysis of adhesions.  She received post operative ceftriaxone/flagyl for 5 days.  She continued to have abdominal pain and difficulty advancing diet and hospitalization also complicated by facial droop and concern for CVA but MRI negative for CVA.  She has required TPN with little po and continued abdominal pain.  On 5/25 she had a fever and work up with negative UA, CXR with atelectasis, blood cultures negative but did have CT abd with subdiaphragmatic fluid collection thought to be abscess but drainage done by IR more c/w non biliary, non pus fluid.  Culture of fluid did grow Enterococcus.  WBC improving and has been afebrile now 48 hours.  She has been on vancomycin.  Has 'itching' listed as allergy to penicillin.  CT scan repeated today and noted improved drainage of fluid, though still remains.     Review of Systems: Review of systems not obtained due to patient factors.  Past Medical History  Diagnosis Date  . CAD (coronary artery disease)     s/p stenting of LAD 1999- cath 5-08 EF normal LAD 30-40% restenosis. D1 50% D2 80% LCX & RCA minimal plaque  . HTN (hypertension)   . Hyperlipemia   . Anemia     iron deficiency  . Depression   . DVT (deep venous thrombosis)   . Gout   . Osteoporosis   .  Pancreatitis   . GERD (gastroesophageal reflux disease)   . Renal insufficiency     Cr 1.2-1.3  . Obesity   . Diabetes mellitus   . Polyarthritis     DJD/ possible PMR  . Vitamin D deficiency   . B12 deficiency   . Tinnitus   . Anxiety   . Aneurysm, thoracic aortic   . Constipation   . Urinary frequency   . Vertigo   . Chronic back pain     History  Substance Use Topics  . Smoking status: Never Smoker   . Smokeless tobacco: Not on file  . Alcohol Use: No    Family History  Problem Relation Age of Onset  . Adopted: Yes  . Diabetes Mother   . Hypertension Father    Allergies  Allergen Reactions  . Amlodipine Besylate     REACTION: dizzy  . Aspirin   .  Atenolol     REACTION: fatigue  . Benazepril     cough  . Benicar [Olmesartan Medoxomil]     HA  . Cozaar     nausea  . Hydrochlorothiazide W-Triamterene     REACTION: dizzy  . Hydrocodone     REACTION: HA  . Hydroxyzine Pamoate   . Iodine   . Lisinopril     REACTION: tired, cough  . Penicillins Itching    tolerates cephalosporins OK  . Pravastatin     myalgias  . Prednisolone     My stomach hurts  . Tramadol Hcl     REACTION: HA    OBJECTIVE: Blood pressure 144/75, pulse 99, temperature 98.4 F (36.9 C), temperature source Oral, resp. rate 18, height 5\' 4"  (1.626 m), weight 149 lb 4 oz (67.7 kg), SpO2 98 %. General: awake, alert, responds to commands but does not talk much Skin: no rashes Lungs: CTA B Cor: RRR Abdomen: soft, diffuse tenderness R>L, 2 JP drains in place with white drainage, liquid consistency, not cw pus. Ext: no edema  Microbiology: Recent Results (from the past 240 hour(s))  Culture, routine-abscess     Status: None   Collection Time: 04/03/15  3:18 PM  Result Value Ref Range Status   Specimen Description ABSCESS LEFT ABDOMEN UPPER  Final   Special Requests NONE  Final   Gram Stain   Final    MODERATE WBC PRESENT,BOTH PMN AND MONONUCLEAR NO SQUAMOUS EPITHELIAL CELLS SEEN NO ORGANISMS SEEN Performed at 04/05/15    Culture   Final    NO GROWTH 3 DAYS Performed at [facility]    Report Status 04/07/2015 FINAL  Final  Culture, routine-abscess     Status: None   Collection Time: 04/03/15  3:18 PM  Result Value Ref Range Status   Specimen Description ABSCESS ABDOMEN MID  Final   Special Requests NONE  Final   Gram Stain   Final    ABUNDANT WBC PRESENT,BOTH PMN AND MONONUCLEAR NO SQUAMOUS EPITHELIAL CELLS SEEN NO ORGANISMS SEEN Performed at 04/05/15    Culture   Final    FEW ENTEROCOCCUS SPECIES Performed at [facility]    Report Status 04/07/2015 FINAL  Final   Organism ID, Bacteria  ENTEROCOCCUS SPECIES  Final      Susceptibility   Enterococcus species - MIC*    VANCOMYCIN 2 SENSITIVE Sensitive     AMPICILLIN <=2 SENSITIVE Sensitive     * FEW ENTEROCOCCUS SPECIES  Culture, Urine     Status: None (Preliminary result)  Collection Time: 04/06/15  3:27 PM  Result Value Ref Range Status   Specimen Description URINE, CATHETERIZED  Final   Special Requests NONE  Final   Colony Count   Final    >=100,000 COLONIES/ML Performed at [facility]    Culture   Final    GRAM NEGATIVE RODS ENTEROCOCCUS SPECIES Performed at [facility]    Report Status PENDING  Incomplete  Culture, blood (routine x 2)     Status: None (Preliminary result)   Collection Time: 04/06/15  4:40 PM  Result Value Ref Range Status   Specimen Description BLOOD RIGHT ARM  Final   Special Requests BOTTLES DRAWN AEROBIC AND ANAEROBIC 10CC  Final   Culture   Final           BLOOD CULTURE RECEIVED NO GROWTH TO DATE CULTURE WILL BE HELD FOR 5 DAYS BEFORE ISSUING A FINAL NEGATIVE REPORT Performed at [facility]    Report Status PENDING  Incomplete  Culture, blood (routine x 2)     Status: None (Preliminary result)   Collection Time: 04/06/15  4:50 PM  Result Value Ref Range Status   Specimen Description BLOOD RIGHT HAND  Final   Special Requests BOTTLES DRAWN AEROBIC AND ANAEROBIC 10CC  Final   Culture   Final           BLOOD CULTURE RECEIVED NO GROWTH TO DATE CULTURE WILL BE HELD FOR 5 DAYS BEFORE ISSUING A FINAL NEGATIVE REPORT Performed at [facility]    Report Status PENDING  Incomplete    Maika Kaczmarek, Molly Maduro, MD [facility] [email or web address] [phone] pager  [phone] cell 04/08/2015, 2:22 PM

## 2015-04-08 NOTE — Progress Notes (Signed)
CSW following for discharge planning SNF when medically ready. Awaiting CT scan on Saturday & still not medically stable for discharge. Patient's daughters prefer that she go to [facility] where they just sent their father/patient's husband last week for short-term rehab. CSW submitted clinical information to [facility] but until TPN is discontinued, they would not be able to offer a bed. CSW informed patient's daughter that incase patient is ready for discharge & she's still on TPN the only SNF in [facility] that takes TPN & is in-network with [employer] is [facility].    CSW has completed FL2 & will continue to follow and assist with discharge when ready.    Lincoln Maxin, LCSW [facility] Clinical Social Worker cell #: [phone]

## 2015-04-08 NOTE — Progress Notes (Signed)
Patient ID: [name], female   DOB: Feb 04, 1933, 81 y.o.   MRN: [id number]    Referring Physician(s): CCS  Subjective:  Pt still with some lower abd discomfort; denies N/V; poor appetite; passing gas but no BM  Allergies: Amlodipine besylate; Aspirin; Atenolol; Benazepril; Benicar; Cozaar; Hydrochlorothiazide w-triamterene; Hydrocodone; Hydroxyzine pamoate; Iodine; Lisinopril; Penicillins; Pravastatin; Prednisolone; and Tramadol hcl  Medications: Prior to Admission medications   Medication Sig Start Date End Date Taking? Authorizing Provider  acetaminophen (TYLENOL) 650 MG CR tablet Take 1,300 mg by mouth every 8 (eight) hours as needed for pain.    Yes Historical Provider, MD  aspirin 325 MG tablet Take 325 mg by mouth daily.   Yes Historical Provider, MD  cholecalciferol (VITAMIN D) 1000 UNITS tablet Take 2 tablets (2,000 Units total) by mouth daily. 04/17/13  Yes Aleksei Plotnikov V, MD  Cyanocobalamin (VITAMIN B-12) 1000 MCG SUBL Place 1 tablet (1,000 mcg total) under the tongue daily. 12/03/13  Yes Aleksei Plotnikov V, MD  furosemide (LASIX) 20 MG tablet Take 1 tablet (20 mg total) by mouth daily as needed for edema. Patient taking differently: Take 20 mg by mouth daily.  03/10/15 03/09/16 Yes Aleksei Plotnikov V, MD  metFORMIN (GLUCOPHAGE) 500 MG tablet TAKE 1 TABLET TWICE DAILY  WITH  A  MEAL 08/23/14  Yes Aleksei Plotnikov V, MD  OVER THE COUNTER MEDICATION Apply 1 application topically 2 (two) times daily as needed (dryness). Diabetic Lotion.   Yes Historical Provider, MD  oxybutynin (DITROPAN) 5 MG tablet Take 1 tablet (5 mg total) by mouth 2 (two) times daily. 11/25/14  Yes Aleksei Plotnikov V, MD  Polyethyl Glycol-Propyl Glycol (SYSTANE OP) Apply 1-2 drops to eye daily as needed (dry eyes).   Yes Historical Provider, MD  LORazepam (ATIVAN) 1 MG tablet Take 0.5 tablets (0.5 mg total) by mouth every 8 (eight) hours as needed for anxiety. Patient not taking: Reported on 03/18/2015  05/07/13   Bethann Berkshire, MD     Vital Signs: BP 144/75 mmHg  Pulse 99  Temp(Src) 98.4 F (36.9 C) (Oral)  Resp 18  Ht 5\' 4"  (1.626 m)  Wt 149 lb 4 oz (67.7 kg)  BMI 25.61 kg/m2  SpO2 98%  Physical Exam pt awake, responds to questions, occ moans; left abd drains intact, outputs 10 cc's each turbid, beige colored fluid; both drains flushed with 10 cc's sterile NS with little return  Imaging: Dg Chest 2 View  04/06/2015   CLINICAL DATA:  Fever  EXAM: CHEST  2 VIEW  COMPARISON:  03/27/2015  FINDINGS: Cardiac shadow is mildly enlarged. Left basilar atelectasis is again identified. Two recently placed drainage catheters are noted in the left upper abdomen. Mild right basilar atelectasis is noted. A left-sided PICC line is noted with the tip at the junction of the innominate veins. This has withdrawn slightly in the interval from the prior exam. No acute bony abnormality is seen.  IMPRESSION: Bibasilar atelectasis. This has increased in the interval from the prior exam.  Left upper quadrant drainage catheters.   Electronically Signed   By: Alcide Clever M.D.   On: 04/06/2015 14:57   Ct Abdomen Pelvis W Contrast  04/08/2015   CLINICAL DATA:  SOB, Abscess drains 04/04/2015. Surg: TAH, Gb, tubal, laparoscopy adhesion. Hx of aortic thoracic aneurysm. Elev WBC's, fever.  EXAM: CT ABDOMEN AND PELVIS WITH CONTRAST  TECHNIQUE: Multidetector CT imaging of the abdomen and pelvis was performed using the standard protocol following bolus administration of intravenous contrast.  CONTRAST:  OMNIPAQUE IOHEXOL 300 MG/ML  SOLN  COMPARISON:  04/03/2015 and earlier studies  FINDINGS: Small left pleural effusion. Consolidation/atelectasis with air bronchograms in the posterior and medial basal segments left lower lobe, and patchy airspace disease posteriorly at the right lung base. Coronary calcifications.  Stable percutaneous drain from intercostal approach posterior to the spleen, with some decrease in the  subcapsular/subphrenic collection, with moderate residual. Catheter is at the dependent posterior aspect of the residual component.  Stable left mid abdominal percutaneous pigtail drain from an intercostal approach, with decrease in size of thick-walled complex fluid collection, residual 2.1 x 4.3 cm. Catheter appears well positioned centrally within the residual.  No new intra-abdominal collections. Surgical clips in the gallbladder fossa. Stable mild central intrahepatic biliary ductal dilatation and dilatation of the common duct. No focal liver lesion. Unremarkable spleen, adrenal glands, pancreas, kidneys. No hydronephrosis. Patchy aortoiliac calcifications without aneurysm or stenosis. Portal vein patent. Stomach, small bowel, and colon are nondilated. Multiple diverticula from the sigmoid colon without adjacent inflammatory/edematous change. The urinary bladder is incompletely distended. Bilateral pelvic phleboliths. Old healed pubic fractures. Spondylitic changes in the mid and lower lumbar spine, with mild dextroscoliosis.  IMPRESSION: 1. Some interval improvement in subphrenic and left mid abdomen abscess collections post percutaneous drain placement x2. Both have residual undrained components but the drain catheters appear well positioned. 2. Persistent small left pleural effusion and bibasilar airspace disease left greater than right. 3. Atherosclerosis, including aortoiliac and coronary artery disease. Please note that although the presence of coronary artery calcium documents the presence of coronary artery disease, the severity of this disease and any potential stenosis cannot be assessed on this non-gated CT examination. Assessment for potential risk factor modification, dietary therapy or pharmacologic therapy may be warranted, if clinically indicated.   Electronically Signed   By: Corlis Leak M.D.   On: 04/08/2015 09:49    Labs:  CBC:  Recent Labs  04/04/15 0650 04/06/15 0445 04/07/15 0454  04/08/15 1213  WBC 16.7* 14.3* 13.9* 11.8*  HGB 8.8* 8.5* 7.8* 8.5*  HCT 28.3* 27.4* 25.8* 27.5*  PLT 465* 530* 506* 605*    COAGS:  Recent Labs  03/27/15 2348 04/03/15 1140  INR 1.06 1.22  APTT 40*  --     BMP:  Recent Labs  04/05/15 1000 04/06/15 0445 04/07/15 0454 04/08/15 0430  NA 132* 132* 135 133*  K 4.5 4.8 5.0 4.6  CL 103 102 104 104  CO2 22 22 22  21*  GLUCOSE 113* 153* 136* 121*  BUN 46* 47* 46* 43*  CALCIUM 8.3* 8.7* 8.9 8.9  CREATININE 0.83 0.74 0.82 0.77  GFRNONAA >60 >60 >60 >60  GFRAA >60 >60 >60 >60    LIVER FUNCTION TESTS:  Recent Labs  03/29/15 0528 03/31/15 0350 04/04/15 0650 04/07/15 0454  BILITOT 0.4 0.4 0.5 0.4  AST 33 26 45* 43*  ALT 18 14 26  32  ALKPHOS 98 98 139* 204*  PROT 5.6* 5.9* 6.8 7.0  ALBUMIN 1.7* 1.8* 1.9* 2.0*    Assessment and Plan: S/p LOA for SBO 5/13, drainage of left upper/lateral abd fluid collections 5/22; fluid cx's- enterococcus; urine cx pend but enterococcus, few gr neg rods noted prelim; pt afebrile; WBC down to 11.8(13.9); CT A/P today reveals improvement in subphrenic/left mid abd collections with some residual fluid remaining- caths in good position; rec cont drainage for now with irrigation and if outputs remain low consider removing in next 2-3 days. OOB;IS   Signed: D. 6/13  Raevin Wierenga 04/08/2015, 2:06 PM   I spent a total of 15 minutes in face to face in clinical consultation/evaluation, greater than 50% of which was counseling/coordinating care for left abd abscess drainage x 2

## 2015-04-08 NOTE — Progress Notes (Signed)
Progress Note   [name] ZOX:[id number] DOB: 08/10/1933 DOA: 03/18/2015 PCP: [name], MD   Brief Narrative:   [name] is an 81 y.o. female with a PMH of hypertension, CAD, hyperlipidemia and diabetes who was admitted 03/18/15 with SBO secondary to adhesions. She failed conservative therapy and underwent laparoscopic lysis of adhesions/laparotomy with bowel decompression on 03/24/15. Her hospital course was complicated by sepsis secondary to peritoneal contamination with transfer to the SDU on 03/25/15. She also developed left-sided facial droop/right arm weakness 03/27/15 with CT scan showing a 4 mm lacunar infarct in the left lentiform nucleus. MRI failed to show any abnormality. Due to failure to improve an ongoing high need for pain control, a repeat CT scan was done 04/02/15 which showed 2 large fluid collections. She underwent percutaneous drainage with placement of abscess drains by IR 04/04/15. Patient has fever on 5/25, but so far all cultures are negative and repeat CXR shows worsening atelectasis and no pneumonia. Repeat CT today shows improvement in the abscess. The abscess culture grew enterococcus and vancomycin started. ID consulted for recommendations.   Assessment/Plan:   Principal Problem:   Small bowel obstruction s/p exlap/LOA/decompression 03/25/2015 complicated by severe sepsis secondary to peritonitis / abdominal pain now complicated by intra-abdominal abscess/fluid collection - Status post laparoscopic lysis of adhesions/laparotomy with bowel decompression 03/24/15. - Subsequently developed acute peritonitis/sepsis. - Initially treated with Mefoxin starting 03/25/15. Antibiotics switched to Flagyl/Rocephin 03/26/15.   - CT scan of the abdomen/pelvis done 04/02/15 which showed 2 large fluid collections.  - 2 percutaneous drains placed by IR 04/03/15 to drain fluid collections.  -Repeat CT today shows improvement in the abscess. The abscess culture grew  enterococcus and vancomycin started. ID consulted for recommendations.   - afebrile and leukcytosis is improving.   Active Problems:   Facial droop / right arm weakness / possible acute CVA left lentiform nucleus - On 03/27/15, CT of the head done to evaluate symptoms showed an acute 4 mm lacunar infarct in the left lentiform nucleus. - Subsequent MRI negative, although clinical findings consistent with acute CVA. MRA negative for stenosis. - 2-D echo with normal EF, no diastolic dysfunction noted. No intracardiac masses or thrombi. - Carotid Dopplers negative for significant stenosis (1-39% bilaterally). - Hemoglobin A1c 6.3%. Cholesterol 99. - Evaluated by neurology, no further stroke workup recommended. - Continue aspirin. - PT/OT.    Hypokalemia/hypomagnesemia - Monitor and replace electrolytes as needed.    Acute postoperative blood loss anemia - Status post 2 units of PRBCs 04/01/15. - Continue to monitor hemoglobin and transfuse for hemoglobin less than 7.    Diabetes type 2, controlled - Currently being managed with moderate scale SSI every 6 hours. CBGs well controlled.  - Hemoglobin A1c 6.3%. CBG (last 3)   Recent Labs  04/08/15 0734 04/08/15 1142 04/08/15 1634  GLUCAP 111* 151* 141*        Anxiety state/Adjustment disorder with mixed anxiety and depressed mood - Mood has been depressed with limited motivation to participate in PT.    Coronary atherosclerosis - Continue aspirin therapy.    Acute renal failure secondary to sepsis  - Resolved with IV fluids.    Right foot pain  - Radiographs negative for fracture. Degenerative changes noted.    Polymyalgia rheumatica/Chronic fatigue disorder - PT/OT.    Elevated blood pressure - Continue as needed Lopressor.    Malnutrition of moderate degree - Continue TPN for nutritional support. Begin to wean.  Acute delirium - Avoid sedating medications. Family requests patient does not get Ativan or Haldol.     Thrombocytopenia - Mild. Likely related to recent sepsis.    DVT Prophylaxis - Continue SCDs.  Code Status: Full. Family Communication: no family present today.   Disposition Plan: Home with daughter when diet advanced,.   IV Access:    PICC placed 03/23/15   Procedures and diagnostic studies:   Dg Chest 2 View  04/06/2015   CLINICAL DATA:  Fever  EXAM: CHEST  2 VIEW  COMPARISON:  03/27/2015  FINDINGS: Cardiac shadow is mildly enlarged. Left basilar atelectasis is again identified. Two recently placed drainage catheters are noted in the left upper abdomen. Mild right basilar atelectasis is noted. A left-sided PICC line is noted with the tip at the junction of the innominate veins. This has withdrawn slightly in the interval from the prior exam. No acute bony abnormality is seen.  IMPRESSION: Bibasilar atelectasis. This has increased in the interval from the prior exam.  Left upper quadrant drainage catheters.   Electronically Signed   By: Alcide Clever M.D.   On: 04/06/2015 14:57   Ct Head Wo Contrast  03/27/2015   CLINICAL DATA:  RIGHT-sided facial droop. Symptoms began earlier today. Altered mental status.  EXAM: CT HEAD WITHOUT CONTRAST  TECHNIQUE: Contiguous axial images were obtained from the base of the skull through the vertex without intravenous contrast.  COMPARISON:  05/27/2014.  FINDINGS: Possible acute 4 mm lacune, LEFT lentiform nucleus as seen on image 12 series 5. This was not clearly present on in 2015 scan.  No other areas of concern for cortical or subcortical infarction.  No hemorrhage, mass lesion, hydrocephalus, or extra-axial fluid.  Cerebral and cerebellar atrophy. Generalized white matter hypoattenuation, likely small vessel disease. Than the calvarium intact. Vascular calcification. No sinus or mastoid disease. Dense lenticular opacities.  IMPRESSION: Possible acute 4 mm lacunar infarct LEFT lentiform nucleus. If further investigation desired, and no contraindications,  consider MRI brain.   Electronically Signed   By: Davonna Belling M.D.   On: 03/27/2015 16:25   Mr Maxine Glenn Head Wo Contrast  03/28/2015   CLINICAL DATA:  81 year old with right-sided facial droop. Altered mental status. Subsequent encounter.  EXAM: MRI HEAD WITHOUT CONTRAST  MRA HEAD WITHOUT CONTRAST  TECHNIQUE: Multiplanar, multiecho pulse sequences of the brain and surrounding structures were obtained without intravenous contrast. Angiographic images of the head were obtained using MRA technique without contrast.  COMPARISON:  03/27/2015 head CT.  FINDINGS: MRI HEAD FINDINGS  No acute infarct.  No intracranial hemorrhage.  Remote tiny infarct right cerebellum. Minimal small vessel disease type changes.  Incidentally noted is a prominent peri vascular space left lenticular nucleus.  Global atrophy without hydrocephalus.  No intracranial mass lesion noted on this unenhanced exam.  Cervical medullary junction unremarkable. Mild spinal stenosis C3-4.  Expanded partially empty sella without other findings of pseudotumor cerebri  Pineal region unremarkable.  No intracranial mass lesion noted on this unenhanced exam.  MRA HEAD FINDINGS  Mild narrowing pre cavernous/ cavernous segment right internal carotid artery.  Mild ectasia left internal carotid artery cavernous segment. The minimal bulge along the left internal carotid artery cavernous segment most likely related to slight ectasia of the left ophthalmic artery origin rather than small aneurysm. Mild motion degradation limits evaluation.  Slightly ectatic anterior communicating artery without aneurysm.  Middle cerebral artery mild branch vessel irregularity bilaterally.  No significant stenosis distal vertebral arteries or basilar artery. Regions of mild  irregularity and slight narrowing.  IMPRESSION: MRI HEAD  No acute infarct.  Remote tiny infarct right cerebellum.  Incidentally noted is a prominent peri vascular space left lenticular nucleus.  Global atrophy without  hydrocephalus.  Expanded partially empty sella without other findings of pseudotumor cerebri  MRA HEAD FINDINGS  No medium or large size vessel significant stenosis or occlusion. Evaluation of branch vessels slightly limited by motion degradation.   Electronically Signed   By: Lacy Duverney M.D.   On: 03/28/2015 12:02   Mr Brain Wo Contrast  03/28/2015   CLINICAL DATA:  81 year old with right-sided facial droop. Altered mental status. Subsequent encounter.  EXAM: MRI HEAD WITHOUT CONTRAST  MRA HEAD WITHOUT CONTRAST  TECHNIQUE: Multiplanar, multiecho pulse sequences of the brain and surrounding structures were obtained without intravenous contrast. Angiographic images of the head were obtained using MRA technique without contrast.  COMPARISON:  03/27/2015 head CT.  FINDINGS: MRI HEAD FINDINGS  No acute infarct.  No intracranial hemorrhage.  Remote tiny infarct right cerebellum. Minimal small vessel disease type changes.  Incidentally noted is a prominent peri vascular space left lenticular nucleus.  Global atrophy without hydrocephalus.  No intracranial mass lesion noted on this unenhanced exam.  Cervical medullary junction unremarkable. Mild spinal stenosis C3-4.  Expanded partially empty sella without other findings of pseudotumor cerebri  Pineal region unremarkable.  No intracranial mass lesion noted on this unenhanced exam.  MRA HEAD FINDINGS  Mild narrowing pre cavernous/ cavernous segment right internal carotid artery.  Mild ectasia left internal carotid artery cavernous segment. The minimal bulge along the left internal carotid artery cavernous segment most likely related to slight ectasia of the left ophthalmic artery origin rather than small aneurysm. Mild motion degradation limits evaluation.  Slightly ectatic anterior communicating artery without aneurysm.  Middle cerebral artery mild branch vessel irregularity bilaterally.  No significant stenosis distal vertebral arteries or basilar artery. Regions of  mild irregularity and slight narrowing.  IMPRESSION: MRI HEAD  No acute infarct.  Remote tiny infarct right cerebellum.  Incidentally noted is a prominent peri vascular space left lenticular nucleus.  Global atrophy without hydrocephalus.  Expanded partially empty sella without other findings of pseudotumor cerebri  MRA HEAD FINDINGS  No medium or large size vessel significant stenosis or occlusion. Evaluation of branch vessels slightly limited by motion degradation.   Electronically Signed   By: Lacy Duverney M.D.   On: 03/28/2015 12:02   Ct Abdomen Pelvis W Contrast  04/08/2015   CLINICAL DATA:  SOB, Abscess drains 04/04/2015. Surg: TAH, Gb, tubal, laparoscopy adhesion. Hx of aortic thoracic aneurysm. Elev WBC's, fever.  EXAM: CT ABDOMEN AND PELVIS WITH CONTRAST  TECHNIQUE: Multidetector CT imaging of the abdomen and pelvis was performed using the standard protocol following bolus administration of intravenous contrast.  CONTRAST:  OMNIPAQUE IOHEXOL 300 MG/ML  SOLN  COMPARISON:  04/03/2015 and earlier studies  FINDINGS: Small left pleural effusion. Consolidation/atelectasis with air bronchograms in the posterior and medial basal segments left lower lobe, and patchy airspace disease posteriorly at the right lung base. Coronary calcifications.  Stable percutaneous drain from intercostal approach posterior to the spleen, with some decrease in the subcapsular/subphrenic collection, with moderate residual. Catheter is at the dependent posterior aspect of the residual component.  Stable left mid abdominal percutaneous pigtail drain from an intercostal approach, with decrease in size of thick-walled complex fluid collection, residual 2.1 x 4.3 cm. Catheter appears well positioned centrally within the residual.  No new intra-abdominal collections. Surgical clips in  the gallbladder fossa. Stable mild central intrahepatic biliary ductal dilatation and dilatation of the common duct. No focal liver lesion.  Unremarkable spleen, adrenal glands, pancreas, kidneys. No hydronephrosis. Patchy aortoiliac calcifications without aneurysm or stenosis. Portal vein patent. Stomach, small bowel, and colon are nondilated. Multiple diverticula from the sigmoid colon without adjacent inflammatory/edematous change. The urinary bladder is incompletely distended. Bilateral pelvic phleboliths. Old healed pubic fractures. Spondylitic changes in the mid and lower lumbar spine, with mild dextroscoliosis.  IMPRESSION: 1. Some interval improvement in subphrenic and left mid abdomen abscess collections post percutaneous drain placement x2. Both have residual undrained components but the drain catheters appear well positioned. 2. Persistent small left pleural effusion and bibasilar airspace disease left greater than right. 3. Atherosclerosis, including aortoiliac and coronary artery disease. Please note that although the presence of coronary artery calcium documents the presence of coronary artery disease, the severity of this disease and any potential stenosis cannot be assessed on this non-gated CT examination. Assessment for potential risk factor modification, dietary therapy or pharmacologic therapy may be warranted, if clinically indicated.   Electronically Signed   By: Corlis Leak M.D.   On: 04/08/2015 09:49   Ct Abdomen Pelvis W Contrast  04/02/2015   CLINICAL DATA:  Nonspecific abdominal pain. Status post recent laparotomy for small bowel obstruction.  EXAM: CT ABDOMEN AND PELVIS WITH CONTRAST  TECHNIQUE: Multidetector CT imaging of the abdomen and pelvis was performed using the standard protocol following bolus administration of intravenous contrast.  CONTRAST:  50mL OMNIPAQUE IOHEXOL 300 MG/ML SOLN, OMNIPAQUE IOHEXOL 300 MG/ML SOLN  COMPARISON:  Renal ultrasound dated 03/25/2015 and abdomen and pelvis CT dated 03/18/2015.  FINDINGS: Cholecystectomy clips. Interval midline surgical scar in the skin clips at the level of the  pelvis. Loculated fluid with peripheral rim enhancement beneath the left hemidiaphragm and partially surrounding the spleen. This measures 7.7 x 5.3 cm on axial image 16. This measures 7.7 cm in length on coronal image number 92.  There is an additional fluid collection in the lateral aspect of the left mid abdomen, measuring 8.3 x 4.2 cm on axial image number 31. This also has peripheral rim enhancement. This measures 9.8 cm in length on coronal image number 64.  There are dilated small bowel loops in the lower abdomen and upper pelvis. Above the urinary bladder, one of these loops contains fluid, small amount of oral contrast and some gas. No definite separate fluid collection is seen.  A Foley catheter is in the urinary bladder with associated air in the bladder. No free peritoneal air seen. Atheromatous arterial calcifications, including the coronary arteries. Cholecystectomy clips.  Small left pleural effusion. Bilateral lower lobe atelectasis. Multiple colonic diverticula. Small left inguinal hernia containing fat. Diffuse subcutaneous edema. Lumbar and lower thoracic spine degenerative changes and scoliosis.  IMPRESSION: 1. 7.7 x 7.7 x 5.3 cm fluid collection with rim enhancement beneath the left hemidiaphragm and partially surrounding the spleen. This is suspicious for an abscess. 2. 9.8 x 8.3 x 4.2 cm arm left mid abdominal fluid collection with peripheral rim enhancement, suspicious for an abscess. 3. Focal ileus or partial obstruction involving small bowel in the inferior abdomen and upper pelvis. 4. Small left pleural effusion. 5. Bilateral lower lobe atelectasis. 6. Colonic diverticulosis.   Electronically Signed   By: Beckie Salts M.D.   On: 04/02/2015 17:38   Ct Abdomen Pelvis W Contrast  03/18/2015   CLINICAL DATA:  Nausea and vomiting for 4 days, anxiety, status post hysterectomy  CT pelvis 05/27/2014  EXAM: CT ABDOMEN AND PELVIS WITH CONTRAST  TECHNIQUE: Multidetector CT imaging of the abdomen and  pelvis was performed using the standard protocol following bolus administration of intravenous contrast.  CONTRAST:  50mL OMNIPAQUE IOHEXOL 300 MG/ML SOLN, OMNIPAQUE IOHEXOL 300 MG/ML SOLN  COMPARISON:  None.  FINDINGS: Sagittal images of the spine shows diffuse osteopenia. Degenerative changes thoracolumbar spine. The lung bases are unremarkable.  The patient is status post cholecystectomy. Mild intrahepatic biliary ductal dilatation. There is CBD dilatation up to 1.1 cm. No focal hepatic mass. The pancreas, spleen and adrenal glands are unremarkable. Kidneys are symmetrical in size and enhancement. No hydronephrosis or hydroureter. Delayed renal images shows bilateral renal symmetrical excretion.  Atherosclerotic calcifications of abdominal aorta and iliac arteries. Moderate stool noted in right colon and proximal transverse colon. No colonic obstruction. The descending colon and sigmoid colon are empty collapsed. Some stool noted within rectum.  There are fluid distended small bowel loops in mid upper abdomen and left lower abdomen and pelvis. Small amount of fluid/stranding noted a adjacent to small bowel loops in left lower quadrant see axial image 57. Distal small bowel is decreased caliber collapsed. Findings are consistent with small bowel obstruction.  Mild distended urinary bladder. The patient is status post hysterectomy. The terminal ileum is small caliber decompressed. Contrast material is noted within stomach. No any contrast material is noted within small bowel or colon.  IMPRESSION: 1. There are fluid distended small bowel loops with multiple air-fluid levels highly suspicious for small bowel obstruction. Distal small bowel is small caliber decompressed. Terminal ileum is small caliber. Small amount of fluid/stranding noted adjacent to small bowel loops in left lower quadrant please see images 49 and 57. 2. Moderate stool noted in right colon and proximal transverse colon. Descending colon and  sigmoid colon are empty collapsed. 3. No pericecal inflammation. 4. No hydronephrosis or hydroureter. 5. Status post cholecystectomy. Mild intrahepatic biliary ductal dilatation. CBD dilatation up to 1.1 cm. 6. No hydronephrosis or hydroureter. 7. Status post hysterectomy. These results were called by telephone at the time of interpretation on 03/18/2015 at 8:59 am to Dr. Gerhard Munch , who verbally acknowledged these results.   Electronically Signed   By: Natasha Mead M.D.   On: 03/18/2015 08:59   US Renal  03/25/2015   CLINICAL DATA:  Acute renal failure.  EXAM: RENAL / URINARY TRACT ULTRASOUND COMPLETE  COMPARISON:  None.  FINDINGS: Right Kidney:  Length: 8.1 cm. Increased echogenicity consistent with chronic medical renal disease. No mass or hydronephrosis visualized.  Left Kidney:  Length: 9.3 cm. Increased echogenicity consistent chronic medical renal disease. No mass or hydronephrosis visualized.  Bladder:  Bladder decompressed by Foley catheter.  IMPRESSION: Bilateral echodense kidneys consistent chronic medical renal disease. No acute abnormality. No hydronephrosis or bladder distention.   Electronically Signed   By: Maisie Fus  Register   On: 03/25/2015 13:27   Dg Chest Port 1 View  03/27/2015   CLINICAL DATA:  Left PICC placement. Nausea and vomiting. Initial encounter.  EXAM: PORTABLE CHEST - 1 VIEW  COMPARISON:  Chest radiograph performed 03/26/2015  FINDINGS: The patient's left PICC is noted ending about the proximal SVC. An enteric tube is noted extending below the diaphragm.  The lungs are hypoexpanded. Bibasilar and right upper lung zone airspace opacities may reflect atelectasis or pneumonia. No pleural effusion or pneumothorax is seen  The cardiomediastinal silhouette is borderline normal in size. No acute osseous abnormalities are identified.  IMPRESSION: 1.  Left PICC noted ending about the proximal SVC. 2. Lungs hypoexpanded. Bibasilar and right upper lung zone airspace opacities may reflect  atelectasis or pneumonia.  These results were called by telephone at the time of interpretation on 03/27/2015 at 7:33 pm to Nursing in the [facility], who verbally acknowledged these results.   Electronically Signed   By: Roanna Raider M.D.   On: 03/27/2015 19:34   Dg Chest Port 1 View  03/26/2015   CLINICAL DATA:  Atelectasis  EXAM: PORTABLE CHEST - 1 VIEW  COMPARISON:  Yesterday  FINDINGS: NG tube and left PICC are stable. Upper normal heart size. Low volumes. Bibasilar atelectasis is not significantly changed. No pneumothorax.  IMPRESSION: Stable bibasilar atelectasis.   Electronically Signed   By: Jolaine Click M.D.   On: 03/26/2015 08:14   Dg Chest Port 1 View  03/25/2015   CLINICAL DATA:  Tachycardia and hypotension; concern for sepsis  EXAM: PORTABLE CHEST - 1 VIEW  COMPARISON:  May 27, 2014  FINDINGS: There is patchy atelectasis in both lung bases. The degree of inspiration is shallow. There is no frank airspace consolidation. Heart is upper normal in size with pulmonary vascularity within normal limits.  Central catheter tip is in the superior vena cava. Nasogastric tube tip and side port are in the distal stomach. No pneumothorax.  IMPRESSION: Bibasilar atelectasis. No pneumothorax. Degree of inspiration shallow.   Electronically Signed   By: Bretta Bang III M.D.   On: 03/25/2015 09:03   Dg Abd Portable 1v  03/24/2015   CLINICAL DATA:  Small-bowel obstruction  EXAM: PORTABLE ABDOMEN - 1 VIEW  COMPARISON:  03/23/2015  FINDINGS: NG tube is been placed with the tip in the gastric antrum  Small bowel dilatation shows mild interval improvement. Colon is decompressed with moderate stool in the right colon. Surgical clips in the gallbladder fossa.  IMPRESSION: Mild improvement in small bowel obstruction. NG tube in the gastric antrum.   Electronically Signed   By: Marlan Palau M.D.   On: 03/24/2015 08:25   Dg Abd Portable 1v  03/23/2015   CLINICAL DATA:  Followup small bowel  obstruction.  EXAM: PORTABLE ABDOMEN - 1 VIEW  COMPARISON:  03/22/2015  FINDINGS: Dilated loops small bowel are similar to the prior exam allowing for differences in radiographic technique different degrees of magnification.  There is a small amount of air in a nondistended colon.  IMPRESSION: 1. Persistent high-grade partial small bowel obstruction. No significant change from the previous day's study.   Electronically Signed   By: Amie Portland M.D.   On: 03/23/2015 10:01   Dg Abd Portable 1v  03/22/2015   CLINICAL DATA:  81 year old female with recent small bowel obstruction. Initial encounter.  EXAM: PORTABLE ABDOMEN - 1 VIEW  COMPARISON:  03/21/2015 and earlier, including CT Abdomen and Pelvis 03/18/2015.  FINDINGS: Portable AP supine view at 0517 hrs. Enteric tube has been removed. Recurrent dilated gas-filled small bowel loops in the mid abdomen up to 41 mm diameter. Abundant stool now at the splenic flexure. Decreased distal colon gas in the pelvis. Stable cholecystectomy clips. Stable abdominal and pelvic visceral contours. Grossly stable lung bases. Stable visualized osseous structures.  IMPRESSION: NG tube removed with recurrent small bowel obstruction.   Electronically Signed   By: Odessa Fleming M.D.   On: 03/22/2015 07:42   Dg Abd Portable 1v  03/21/2015   CLINICAL DATA:  Small-bowel obstruction .  EXAM: PORTABLE ABDOMEN - 1 VIEW  COMPARISON:  03/20/2015 .  FINDINGS: NG tube noted with tip in the upper portion stomach. Slight advancement should be considered . Surgical clips right upper quadrant Soft tissue structures are unremarkable. Small-bowel distention has improved. No free air. Stool noted throughout the colon. No acute bony abnormality.  IMPRESSION: 1. NG tube noted with tip in the upper portion of the stomach. Advancement suggested.  2. Interim improvement of small bowel distention. Colonic gas pattern is normal with stool throughout the colon.  These results will be called to the ordering  clinician or representative by the Radiologist Assistant, and communication documented in the PACS or zVision Dashboard.   Electronically Signed   By: Maisie Fus  Register   On: 03/21/2015 07:32   Dg Abd Portable 1v  03/20/2015   CLINICAL DATA:  Followup small bowel obstruction.  EXAM: PORTABLE ABDOMEN - 1 VIEW  COMPARISON:  03/19/2015  FINDINGS: Mild small bowel dilation persists most evident in the left upper quadrant. There has been no substantial change from the prior study. Residual contrast is noted in the bladder, also similar to the prior exam. Nasogastric tube tip projects in the distal stomach.  IMPRESSION: Small bowel dilation persists consistent with a persistent partial small bowel obstruction. No change from the previous day's study.   Electronically Signed   By: Amie Portland M.D.   On: 03/20/2015 07:29   Dg Abd Portable 1v  03/19/2015   CLINICAL DATA:  Small bowel obstruction protocol. Small bowel obstruction.  EXAM: PORTABLE ABDOMEN - 1 VIEW  COMPARISON:  03/18/2015.  FINDINGS: This is a 24 hr film. There is no contrast identified in the cecum. On the prior exam at 2046 hr yesterday, oral contrast was present in the stomach. Excreted contrast is present within the urinary bladder. Dilation of small bowel loops measures 36 mm. Cholecystectomy clips are present in the right upper quadrant.  There is less small bowel contrast than expected which may be due to dilution in the proximal small bowel. Nasogastric tube remains in the stomach with the tip at the antrum.  Large amount of stool is present in the colon.  IMPRESSION: Small bowel obstruction with no contrast identified in the cecum at 24 hr.   Electronically Signed   By: Andreas Newport M.D.   On: 03/19/2015 17:11   Dg Abd Portable 1v-small Bowel Obstruction Protocol-initial, 8 Hr Delay  03/19/2015   CLINICAL DATA:  Small bowel obstruction  EXAM: PORTABLE ABDOMEN - 1 VIEW  COMPARISON:  Radiographs and CT 03/18/2015  FINDINGS: Nasogastric tube  extends into the distal stomach. There is contrast in the gastric lumen. There is no significant contrast in the small bowel or colon. Dilated stacked loops of small bowel persist in the mid abdomen, probably unchanged. No free air is evident on this single AP supine portable radiograph.  IMPRESSION: Enteric contrast has not reached the colon. Persistent small bowel dilatation consistent with small-bowel obstruction.   Electronically Signed   By: Ellery Plunk M.D.   On: 03/19/2015 01:48   Dg Abd Portable 1v-small Bowel Protocol-position Verification  03/18/2015   CLINICAL DATA:  Small bowel protocol, nasogastric tube placement  EXAM: PORTABLE ABDOMEN - 1 VIEW  COMPARISON:  Portable exam 1303 hours compared to CT abdomen and pelvis 03/18/2015  FINDINGS: Tip of nasogastric tube projects over distal gastric antrum or duodenal bulb, patient slightly rotated to the RIGHT.  Excreted contrast material within renal collecting systems and distended urinary bladder.  Air-filled loops of dilated small bowel noted in the mid  abdomen suspicious for small bowel obstruction.  Some gas and stool remain present within the colon.  Degenerative disc and facet disease changes thoracolumbar spine.  Osseous demineralization.  IMPRESSION: Tip of nasogastric tube projects over either the distal gastric antrum or the duodenal bulb region.  Dilated small bowel loops question small bowel obstruction.   Electronically Signed   By: Ulyses Southward M.D.   On: 03/18/2015 13:16   Dg Foot 2 Views Right  03/22/2015   CLINICAL DATA:  Dorsal right foot pain for 4 days, no known injury, initial encounter  EXAM: RIGHT FOOT - 2 VIEW  COMPARISON:  None.  FINDINGS: Degenerative changes are noted in the tarsal bones. No acute fracture or dislocation is noted. A small calcaneal spur is noted.  IMPRESSION: Degenerative change without acute abnormality.   Electronically Signed   By: Alcide Clever M.D.   On: 03/22/2015 14:57   Dg Swallowing Func-speech  Pathology  04/01/2015    Objective Swallowing Evaluation:    Patient Details  Name: [name] MRN: [id number] Date of Birth: [birth year]  Today's Date: 04/01/2015 Time: SLP Start Time (ACUTE ONLY): 0835-SLP Stop Time (ACUTE ONLY): 0857 SLP Time Calculation (min) (ACUTE ONLY): 22 min  Past Medical History:  Past Medical History  Diagnosis Date  . CAD (coronary artery disease)     s/p stenting of LAD 1999- cath 5-08 EF normal LAD 30-40% restenosis. D1  50% D2 80% LCX & RCA minimal plaque  . HTN (hypertension)   . Hyperlipemia   . Anemia     iron deficiency  . Depression   . DVT (deep venous thrombosis)   . Gout   . Osteoporosis   . Pancreatitis   . GERD (gastroesophageal reflux disease)   . Renal insufficiency     Cr 1.2-1.3  . Obesity   . Diabetes mellitus   . Polyarthritis     DJD/ possible PMR  . Vitamin D deficiency   . B12 deficiency   . Tinnitus   . Anxiety   . Aneurysm, thoracic aortic   . Constipation   . Urinary frequency   . Vertigo   . Chronic back pain    Past Surgical History:  Past Surgical History  Procedure Laterality Date  . Hemorrhoid surgery    . Abdominal hysterectomy    . Cholecystectomy    . Tubal ligation    . Coronary angioplasty with stent placement  1999    LAD stent  . Cardiac catheterization  2008    L main 20%, LAD stent patent, D1 50%, D2 80% (small), RCA 20%, EF 55-60%   . Laparoscopy N/A 03/24/2015    Procedure: LAPAROSCOPY DIAGNOSTIC;  Surgeon: Luretha Murphy, MD;   Location: [facility];  Service: General;  Laterality: N/A;  . Laparoscopic lysis of adhesions N/A 03/24/2015    Procedure: LAPAROSCOPIC LYSIS OF ADHESIONS;  Surgeon: Luretha Murphy,  MD;  Location: [facility];  Service: General;  Laterality: N/A;  . Laparotomy N/A 03/24/2015    Procedure: LAPAROTOMY with decompression of bowel;  Surgeon: Luretha Murphy, MD;  Location: [facility];  Service: General;  Laterality: N/A;   HPI:  Other Pertinent Information: Pt is a 81 year old female admitted 03/18/15  with SBO secondary to adhesions. She  failed conservative therapy and  underwent laparoscopic lysis of adhesions/laparotomy with bowel  decompression on 03/24/15. Her hospital course was complicated by sepsis  secondary to peritoneal contamination with transfer to the SDU on 03/25/15.  She also developed  left-sided facial droop/right arm weakness 03/27/15 with  CT scan showing a 4 mm lacunar infarct in the left lentiform nucleus. MRI  failed to show any abnormality. After removal of NG for suction, MD  ordered MBS prior to initiating diet.   No Data Recorded  Assessment / Plan / Recommendation CHL IP CLINICAL IMPRESSIONS 04/01/2015  Therapy Diagnosis Mild pharyngeal phase dysphagia;Mild oral phase  dysphagia  Clinical Impression Mild oral dysphagia characterized by decreased lingual  formation of bolus, improved with straw sips and improving progressively  during exam. oropharyngeal dysphagia also mild with no penetration or  aspiration. Suspect mild standing secretions intially during exam as  pharyngeal residuals decreased and timing of swallow improved with each  trial. Though swallow was initially delayed, by end of exam pt taking  large consecutive sips of thin with minimal delay and minimal residuals.  Recommend pt initiate a dys 3 (mechanical soft) diet with basic aspiration  precautions of upright posture and occasional second swallow to clear oral  cavity and orophrayngeal residuals. SLP will f/u for tolerance during  acute stay.       CHL IP TREATMENT RECOMMENDATION 04/01/2015  Treatment Recommendations Therapy as outlined in treatment plan below     CHL IP DIET RECOMMENDATION 04/01/2015  SLP Diet Recommendations Dysphagia 3 (Mech soft);Thin  Liquid Administration via (None)  Medication Administration Whole meds with liquid  Compensations Multiple dry swallows after each bite/sip  Postural Changes and/or Swallow Maneuvers (None)     CHL IP OTHER RECOMMENDATIONS 04/01/2015  Recommended Consults (None)  Oral Care Recommendations Oral care BID  Other  Recommendations (None)     CHL IP FOLLOW UP RECOMMENDATIONS 03/29/2015  Follow up Recommendations 24 hour supervision/assistance     CHL IP FREQUENCY AND DURATION 04/01/2015  Speech Therapy Frequency (ACUTE ONLY) min 2x/week  Treatment Duration 2 weeks     Pertinent Vitals/Pain NA    SLP Swallow Goals No flowsheet data found.  No flowsheet data found.    CHL IP REASON FOR REFERRAL 04/01/2015  Reason for Referral Objectively evaluate swallowing function     CHL IP ORAL PHASE 04/01/2015  Lips (None)  Tongue (None)  Mucous membranes (None)  Nutritional status (None)  Other (None)  Oxygen therapy (None)  Oral Phase Impaired  Oral - Pudding Teaspoon (None)  Oral - Pudding Cup (None)  Oral - Honey Teaspoon (None)  Oral - Honey Cup (None)  Oral - Honey Syringe (None)  Oral - Nectar Teaspoon (None)  Oral - Nectar Cup (None)  Oral - Nectar Straw (None)  Oral - Nectar Syringe (None)  Oral - Ice Chips (None)  Oral - Thin Teaspoon (None)  Oral - Thin Cup (None)  Oral - Thin Straw (None)  Oral - Thin Syringe (None)  Oral - Puree (None)  Oral - Mechanical Soft (None)  Oral - Regular (None)  Oral - Multi-consistency (None)  Oral - Pill (None)  Oral Phase - Comment (None)      CHL IP PHARYNGEAL PHASE 04/01/2015  Pharyngeal Phase Impaired  Pharyngeal - Pudding Teaspoon (None)  Penetration/Aspiration details (pudding teaspoon) (None)  Pharyngeal - Pudding Cup (None)  Penetration/Aspiration details (pudding cup) (None)  Pharyngeal - Honey Teaspoon (None)  Penetration/Aspiration details (honey teaspoon) (None)  Pharyngeal - Honey Cup (None)  Penetration/Aspiration details (honey cup) (None)  Pharyngeal - Honey Syringe (None)  Penetration/Aspiration details (honey syringe) (None)  Pharyngeal - Nectar Teaspoon (None)  Penetration/Aspiration details (nectar teaspoon) (None)  Pharyngeal - Nectar Cup (None)  Penetration/Aspiration details (  nectar cup) (None)  Pharyngeal - Nectar Straw (None)  Penetration/Aspiration details (nectar straw) (None)   Pharyngeal - Nectar Syringe (None)  Penetration/Aspiration details (nectar syringe) (None)  Pharyngeal - Ice Chips (None)  Penetration/Aspiration details (ice chips) (None)  Pharyngeal - Thin Teaspoon (None)  Penetration/Aspiration details (thin teaspoon) (None)  Pharyngeal - Thin Cup (None)  Penetration/Aspiration details (thin cup) (None)  Pharyngeal - Thin Straw (None)  Penetration/Aspiration details (thin straw) (None)  Pharyngeal - Thin Syringe (None)  Penetration/Aspiration details (thin syringe') (None)  Pharyngeal - Puree (None)  Penetration/Aspiration details (puree) (None)  Pharyngeal - Mechanical Soft (None)  Penetration/Aspiration details (mechanical soft) (None)  Pharyngeal - Regular (None)  Penetration/Aspiration details (regular) (None)  Pharyngeal - Multi-consistency (None)  Penetration/Aspiration details (multi-consistency) (None)  Pharyngeal - Pill (None)  Penetration/Aspiration details (pill) (None)  Pharyngeal Comment (None)      No flowsheet data found.  No flowsheet data found.        Harlon Ditty, [place] CCC-SLP [phone]  Claudine Mouton 04/01/2015, 10:31 AM    Ct Image Guided Drainage Percut Cath  Peritoneal Retroperit  04/04/2015   CLINICAL DATA:  81 year old with postoperative fluid collections. Concern for intra-abdominal abscesses.  EXAM: CT-GUIDED DRAIN PLACEMENT IN LEFT UPPER ABDOMINAL FLUID COLLECTION  CT-GUIDED DRAIN PLACEMENT IN LEFT LATERAL ABDOMINAL FLUID COLLECTION  Physician: Rachelle Hora. Lowella Dandy, MD  FLUOROSCOPY TIME:  None  MEDICATIONS: 4 mg versed, 100 mcg of fentanyl. A radiology nurse monitored the patient for moderate sedation.  ANESTHESIA/SEDATION: Moderate sedation time: 50 min  PROCEDURE: Informed consent was obtained for CT-guided drain placements. Patient was placed supine on the CT scanner. Images through the abdomen were obtained. The collections in the left upper abdomen and lateral left abdomen were identified. The left side of the abdomen was prepped and draped  in sterile fashion. The collection in the left upper abdomen and perisplenic region was initially targeted. Skin was anesthetized with 1% lidocaine. 18 gauge needle was directed into the fluid collection just posterior to the spleen and cloudy yellow fluid was aspirated. A stiff Amplatz wire was placed and the tract was dilated to accommodate a 10.2 [place] multipurpose drain. Catheter sutured to skin and attached to a suction bulb.  Attention was directed to the left lateral abdominal fluid collection. The left lateral abdomen was anesthetized with 1% lidocaine. 18 gauge needle directed into the fluid collection and cloudy yellow fluid was aspirated. Stiff Amplatz wire was placed and the tract was dilated to accommodate a 10.2 [place] multipurpose drain. Catheter sutured to skin and attached to a suction bulb.  FINDINGS: Left upper abdominal fluid collection in the perisplenic region. Drain was successfully placed just posterior to the spleen. There is a second collection in the lateral left mid abdomen. Drain successfully placed in this collection.  Estimated blood loss: Minimal  COMPLICATIONS: None  IMPRESSION: Placement of CT-guided drainage catheters in two left abdominal fluid collections. Cloudy yellow fluid was removed from both collections. Samples were sent for culture.   Electronically Signed   By: Richarda Overlie M.D.   On: 04/04/2015 08:32   Ct Image Guided Drainage Percut Cath  Peritoneal Retroperit  04/04/2015   CLINICAL DATA:  81 year old with postoperative fluid collections. Concern for intra-abdominal abscesses.  EXAM: CT-GUIDED DRAIN PLACEMENT IN LEFT UPPER ABDOMINAL FLUID COLLECTION  CT-GUIDED DRAIN PLACEMENT IN LEFT LATERAL ABDOMINAL FLUID COLLECTION  Physician: Rachelle Hora. Lowella Dandy, MD  FLUOROSCOPY TIME:  None  MEDICATIONS: 4 mg versed, 100 mcg of fentanyl. A radiology nurse monitored  the patient for moderate sedation.  ANESTHESIA/SEDATION: Moderate sedation time: 50 min  PROCEDURE: Informed consent was  obtained for CT-guided drain placements. Patient was placed supine on the CT scanner. Images through the abdomen were obtained. The collections in the left upper abdomen and lateral left abdomen were identified. The left side of the abdomen was prepped and draped in sterile fashion. The collection in the left upper abdomen and perisplenic region was initially targeted. Skin was anesthetized with 1% lidocaine. 18 gauge needle was directed into the fluid collection just posterior to the spleen and cloudy yellow fluid was aspirated. A stiff Amplatz wire was placed and the tract was dilated to accommodate a 10.2 [place] multipurpose drain. Catheter sutured to skin and attached to a suction bulb.  Attention was directed to the left lateral abdominal fluid collection. The left lateral abdomen was anesthetized with 1% lidocaine. 18 gauge needle directed into the fluid collection and cloudy yellow fluid was aspirated. Stiff Amplatz wire was placed and the tract was dilated to accommodate a 10.2 [place] multipurpose drain. Catheter sutured to skin and attached to a suction bulb.  FINDINGS: Left upper abdominal fluid collection in the perisplenic region. Drain was successfully placed just posterior to the spleen. There is a second collection in the lateral left mid abdomen. Drain successfully placed in this collection.  Estimated blood loss: Minimal  COMPLICATIONS: None  IMPRESSION: Placement of CT-guided drainage catheters in two left abdominal fluid collections. Cloudy yellow fluid was removed from both collections. Samples were sent for culture.   Electronically Signed   By: Richarda Overlie M.D.   On: 04/04/2015 08:32     Medical Consultants:    Surgery - Dr. Glenna Fellows   Cardiology   PCCM  Neurology - Dr. Thana Farr   Anti-Infectives:    Mefoxin 03/25/2015 --> 03/26/2015  Rocephin 03/26/2015 -->   Flagyl 03/26/2015 -->  Subjective:   [name] reports abd pain is improved. But has back  pain.   Objective:    Filed Vitals:   04/08/15 0043 04/08/15 0123 04/08/15 0444 04/08/15 1541  BP: 173/76 150/70 144/75 143/62  Pulse: 106 96 99 99  Temp:   98.4 F (36.9 C) 97.7 F (36.5 C)  TempSrc:   Oral Oral  Resp:   18 18  Height:      Weight:      SpO2:   98% 100%    Intake/Output Summary (Last 24 hours) at 04/08/15 1741 Last data filed at 04/08/15 1719  Gross per 24 hour  Intake    150 ml  Output     67 ml  Net     83 ml    Exam: Gen:  NAD Cardiovascular:  RRR, No M/R/G Respiratory:  Lungs CTAB Gastrointestinal:  Abdomen softly distended,  No tenderness, + BS Extremities:  1+ edema   Data Reviewed:    Labs: Basic Metabolic Panel:  Recent Labs Lab 04/02/15 0537  04/04/15 0650 04/05/15 1000 04/06/15 0445 04/07/15 0454 04/08/15 0430  NA 135  < > 132* 132* 132* 135 133*  K 5.8*  < > 4.7 4.5 4.8 5.0 4.6  CL 103  < > 103 103 102 104 104  CO2 25  < > 21* 22 22 22  21*  GLUCOSE 163*  < > 174* 113* 153* 136* 121*  BUN 48*  < > 46* 46* 47* 46* 43*  CREATININE 0.80  < > 0.83 0.83 0.74 0.82 0.77  CALCIUM 8.5*  < > 8.2* 8.3*  8.7* 8.9 8.9  MG 1.7  --  1.5* 2.1  --  1.6* 1.9  PHOS 4.6  --  3.8 4.4  --  4.2  --   < > = values in this interval not displayed. GFR Estimated Creatinine Clearance: 53.9 mL/min (by C-G formula based on Cr of 0.77). Liver Function Tests:  Recent Labs Lab 04/04/15 0650 04/07/15 0454  AST 45* 43*  ALT 26 32  ALKPHOS 139* 204*  BILITOT 0.5 0.4  PROT 6.8 7.0  ALBUMIN 1.9* 2.0*   Coagulation profile  Recent Labs Lab 04/03/15 1140  INR 1.22    CBC:  Recent Labs Lab 04/03/15 0545 04/04/15 0650 04/06/15 0445 04/07/15 0454 04/08/15 1213  WBC 16.9* 16.7* 14.3* 13.9* 11.8*  NEUTROABS  --  15.3*  --  12.4*  --   HGB 9.0* 8.8* 8.5* 7.8* 8.5*  HCT 29.3* 28.3* 27.4* 25.8* 27.5*  MCV 81.6 83.2 82.8 82.4 82.8  PLT 370 465* 530* 506* 605*   Cardiac Enzymes: No results for input(s): CKTOTAL, CKMB, CKMBINDEX, TROPONINI  in the last 168 hours. CBG:  Recent Labs Lab 04/08/15 0021 04/08/15 0421 04/08/15 0734 04/08/15 1142 04/08/15 1634  GLUCAP 97 115* 111* 151* 141*   Sepsis Labs:  Recent Labs Lab 04/04/15 0650 04/06/15 0445 04/07/15 0454 04/08/15 1213  WBC 16.7* 14.3* 13.9* 11.8*   Microbiology Recent Results (from the past 240 hour(s))  Culture, routine-abscess     Status: None   Collection Time: 04/03/15  3:18 PM  Result Value Ref Range Status   Specimen Description ABSCESS LEFT ABDOMEN UPPER  Final   Special Requests NONE  Final   Gram Stain   Final    MODERATE WBC PRESENT,BOTH PMN AND MONONUCLEAR NO SQUAMOUS EPITHELIAL CELLS SEEN NO ORGANISMS SEEN Performed at [facility]    Culture   Final    NO GROWTH 3 DAYS Performed at [facility]    Report Status 04/07/2015 FINAL  Final  Culture, routine-abscess     Status: None   Collection Time: 04/03/15  3:18 PM  Result Value Ref Range Status   Specimen Description ABSCESS ABDOMEN MID  Final   Special Requests NONE  Final   Gram Stain   Final    ABUNDANT WBC PRESENT,BOTH PMN AND MONONUCLEAR NO SQUAMOUS EPITHELIAL CELLS SEEN NO ORGANISMS SEEN Performed at [facility]    Culture   Final    FEW ENTEROCOCCUS SPECIES Performed at [facility]    Report Status 04/07/2015 FINAL  Final   Organism ID, Bacteria ENTEROCOCCUS SPECIES  Final      Susceptibility   Enterococcus species - MIC*    VANCOMYCIN 2 SENSITIVE Sensitive     AMPICILLIN <=2 SENSITIVE Sensitive     * FEW ENTEROCOCCUS SPECIES  Culture, Urine     Status: None (Preliminary result)   Collection Time: 04/06/15  3:27 PM  Result Value Ref Range Status   Specimen Description URINE, CATHETERIZED  Final   Special Requests NONE  Final   Colony Count   Final    >=100,000 COLONIES/ML Performed at [facility]    Culture   Final    GRAM NEGATIVE RODS ENTEROCOCCUS SPECIES Performed at [facility]    Report Status PENDING   Incomplete  Culture, blood (routine x 2)     Status: None (Preliminary result)   Collection Time: 04/06/15  4:40 PM  Result Value Ref Range Status   Specimen Description BLOOD RIGHT ARM  Final  Special Requests BOTTLES DRAWN AEROBIC AND ANAEROBIC 10CC  Final   Culture   Final           BLOOD CULTURE RECEIVED NO GROWTH TO DATE CULTURE WILL BE HELD FOR 5 DAYS BEFORE ISSUING A FINAL NEGATIVE REPORT Performed at [facility]    Report Status PENDING  Incomplete  Culture, blood (routine x 2)     Status: None (Preliminary result)   Collection Time: 04/06/15  4:50 PM  Result Value Ref Range Status   Specimen Description BLOOD RIGHT HAND  Final   Special Requests BOTTLES DRAWN AEROBIC AND ANAEROBIC 10CC  Final   Culture   Final           BLOOD CULTURE RECEIVED NO GROWTH TO DATE CULTURE WILL BE HELD FOR 5 DAYS BEFORE ISSUING A FINAL NEGATIVE REPORT Performed at [facility]    Report Status PENDING  Incomplete     Medications:   . antiseptic oral rinse  7 mL Mouth Rinse q12n4p  . aspirin  300 mg Rectal Daily   Or  . aspirin  325 mg Oral Daily  . bisacodyl  10 mg Rectal Daily  . chlorhexidine  15 mL Mouth Rinse BID  . docusate sodium  100 mg Oral BID  . feeding supplement (ENSURE ENLIVE)  237 mL Oral BID BM  . insulin aspart  0-15 Units Subcutaneous 6 times per day  . lip balm  1 application Topical BID  . metoprolol  2.5 mg Intravenous 4 times per day  . polyethylene glycol  17 g Oral Daily  . vancomycin  500 mg Intravenous Q12H   Continuous Infusions: . sodium chloride 10 mL/hr (03/29/15 0800)  . TPN (CLINIMIX) Adult without lytes 40 mL/hr at 04/07/15 1730   And  . fat emulsion 240 mL (04/07/15 1730)  . TPN (CLINIMIX) Adult without lytes     And  . fat emulsion      Time spent: 25 minutes.   LOS: 21 days   [facility]  [facility] Pager [phone]  If 7PM-7AM, please contact night-coverage at [email or web address], password [email or web address] for any overnight  needs.  04/08/2015, 5:41 PM

## 2015-04-09 LAB — COMPREHENSIVE METABOLIC PANEL WITH GFR
ALT: 31 U/L (ref 14–54)
AST: 39 U/L (ref 15–41)
Albumin: 1.9 g/dL — ABNORMAL LOW (ref 3.5–5.0)
Alkaline Phosphatase: 199 U/L — ABNORMAL HIGH (ref 38–126)
Anion gap: 9 (ref 5–15)
BUN: 41 mg/dL — ABNORMAL HIGH (ref 6–20)
CO2: 20 mmol/L — ABNORMAL LOW (ref 22–32)
Calcium: 9.1 mg/dL (ref 8.9–10.3)
Chloride: 104 mmol/L (ref 101–111)
Creatinine, Ser: 0.7 mg/dL (ref 0.44–1.00)
GFR calc Af Amer: >60 mL/min
GFR calc non Af Amer: >60 mL/min
Glucose, Bld: 125 mg/dL — ABNORMAL HIGH (ref 65–99)
Potassium: 4.4 mmol/L (ref 3.5–5.1)
Sodium: 133 mmol/L — ABNORMAL LOW (ref 135–145)
Total Bilirubin: 0.4 mg/dL (ref 0.3–1.2)
Total Protein: 7.2 g/dL (ref 6.5–8.1)

## 2015-04-09 LAB — GLUCOSE, CAPILLARY
Glucose-Capillary: 104 mg/dL — ABNORMAL HIGH (ref 65–99)
Glucose-Capillary: 111 mg/dL — ABNORMAL HIGH (ref 65–99)
Glucose-Capillary: 112 mg/dL — ABNORMAL HIGH (ref 65–99)
Glucose-Capillary: 130 mg/dL — ABNORMAL HIGH (ref 65–99)
Glucose-Capillary: 130 mg/dL — ABNORMAL HIGH (ref 65–99)
Glucose-Capillary: 133 mg/dL — ABNORMAL HIGH (ref 65–99)
Glucose-Capillary: 141 mg/dL — ABNORMAL HIGH (ref 65–99)

## 2015-04-09 LAB — URINE CULTURE: Colony Count: >=100000

## 2015-04-09 LAB — LIPASE, BLOOD: Lipase: 105 U/L — ABNORMAL HIGH (ref 22–51)

## 2015-04-09 LAB — CBC
HCT: 24.9 % — ABNORMAL LOW (ref 36.0–46.0)
Hemoglobin: 7.7 g/dL — ABNORMAL LOW (ref 12.0–15.0)
MCH: 25.5 pg — ABNORMAL LOW (ref 26.0–34.0)
MCHC: 30.9 g/dL (ref 30.0–36.0)
MCV: 82.5 fL (ref 78.0–100.0)
Platelets: 538 K/uL — ABNORMAL HIGH (ref 150–400)
RBC: 3.02 MIL/uL — ABNORMAL LOW (ref 3.87–5.11)
RDW: 21 % — ABNORMAL HIGH (ref 11.5–15.5)
WBC: 11.7 K/uL — ABNORMAL HIGH (ref 4.0–10.5)

## 2015-04-09 MED ORDER — FAT EMULSION 20 % IV EMUL
240.0000 mL | INTRAVENOUS | Status: AC
  Administered 2015-04-09: 240 mL via INTRAVENOUS
  Filled 2015-04-09: qty 250

## 2015-04-09 MED ORDER — TRACE MINERALS CR-CU-MN-SE-ZN 10-1000-500-60 MCG/ML IV SOLN
INTRAVENOUS | Status: AC
  Administered 2015-04-09: 19:00:00 via INTRAVENOUS
  Filled 2015-04-09: qty 1320

## 2015-04-09 NOTE — Progress Notes (Signed)
PARENTERAL NUTRITION CONSULT NOTE - follow up  Pharmacy Consult for TPN Indication: bowel obstruction  Allergies  Allergen Reactions  . Amlodipine Besylate     REACTION: dizzy  . Aspirin   . Atenolol     REACTION: fatigue  . Benazepril     cough  . Benicar [Olmesartan Medoxomil]     HA  . Cozaar     nausea  . Hydrochlorothiazide W-Triamterene     REACTION: dizzy  . Hydrocodone     REACTION: HA  . Hydroxyzine Pamoate   . Iodine   . Lisinopril     REACTION: tired, cough  . Penicillins Itching    tolerates cephalosporins OK  . Pravastatin     myalgias  . Prednisolone     My stomach hurts  . Tramadol Hcl     REACTION: HA    Patient Measurements: Height: 5' 4"  (162.6 cm) Weight: 149 lb 4 oz (67.7 kg) IBW/kg (Calculated) : 54.7 Adjusted Body Weight: 57.5kg   Vital Signs: Temp: 98.7 F (37.1 C) (05/28 0937) Temp Source: Oral (05/28 0937) BP: 137/71 mmHg (05/28 0937) Pulse Rate: 99 (05/28 0937) Intake/Output from previous day: 05/27 0701 - 05/28 0700 In: 1152.5 [P.O.:180; I.V.:242.5; IV Piggyback:100; TPN:600] Out: 74 [Urine:9; Drains:65] Intake/Output from this shift: Total I/O In: 120 [P.O.:120] Out: 1 [Urine:1]  Labs:  Recent Labs  04/07/15 0454 04/08/15 1213 04/09/15 0503  WBC 13.9* 11.8* 11.7*  HGB 7.8* 8.5* 7.7*  HCT 25.8* 27.5* 24.9*  PLT 506* 605* 538*     Recent Labs  04/07/15 0454 04/08/15 0430 04/09/15 0503  NA 135 133* 133*  K 5.0 4.6 4.4  CL 104 104 104  CO2 22 21* 20*  GLUCOSE 136* 121* 125*  BUN 46* 43* 41*  CREATININE 0.82 0.77 0.70  CALCIUM 8.9 8.9 9.1  MG 1.6* 1.9  --   PHOS 4.2  --   --   PROT 7.0  --  7.2  ALBUMIN 2.0*  --  1.9*  AST 43*  --  39  ALT 32  --  31  ALKPHOS 204*  --  199*  BILITOT 0.4  --  0.4   Estimated Creatinine Clearance: 53.9 mL/min (by C-G formula based on Cr of 0.7).    Recent Labs  04/09/15 04/09/15 0405 04/09/15 0737  GLUCAP 133* 112* 130*    Insulin Requirements: 9 units SSI  last 24hr  Current Nutrition:  Dysphagia 3 diet (start 5/20) - 10-30% meals charted as eaten Ensure BID ordered on 5/20 (took both cans yesterday)  IVF: NS @ 10 ml/hr  Central access: PICC 5/11 TPN start date: 5/11  ASSESSMENT                                                                                                          HPI: 81 year old female with history of hypertension, coronary artery disease, hyperlipidemia, iron deficiency anemia, depression, osteoporosis, mild chronic kidney disease presented to the ED with ongoing nausea for 4 days and diffuse crampy lower abdominal pain since one day prior to  admission. Associated poor by mouth intake and generalized weakness. Patient was found to have small bowel obstruction.  Pharmacy consulted to start TPN.  Significant events:  5/12: laparoscopic lysis of adhesions with laparotomy and decompression of bowel 5/13: new AKI 5/15: Continue NG tube and NPO d/t bilious output pending BM.   5/19: Remove NGT 5/20 Completed swallow eval - starting dysphagia diet 5/21: TPN changed to electrolytes free d/t high K+; given kayexalate.  Ate about 10% of lunch 5/22: IR drainage of 2 abd abscesses, drains placed 5/24: Thrush noted.   5/25: Per RN, TPN found still running at 50 ml/hr this AM, notified RPH and changed rate to 40 ml/hr around 0830. 5/27: Minor improvements in appetite; surgery considering PEG if not improving soon   Today:   Glucose (goal <150) - CBGs at goal last 24h - range 111-151 and improved  Electrolytes - Na low but stable. Other lytes WNL  Renal -  SCr improved to wnl; CrCl 54  LFTs - Alk phos slightly elevated but stable.  Others wnl (5/23)  TGs - wnl (5/23)  Prealbumin: greatly improved 2.7 > 13.7 (5/23)  NUTRITIONAL GOALS                                                                                             RD recs (5/18): 75-85 g/day protein, 1500-1700 Kcal/day. Clinimix E 5/15 at a goal rate of  53m/hr + 20% fat emulsion at 114mhr to provide: 84 g/day protein, 1672 Kcal/day.  PLAN                                                                                                                         1) ) At 1800 today:  Increase Clinimix 5/15 (without lytes) to rate 55 ml/hr  Continue 20% fat emulsion at 1056mr.   TPN to contain standard multivitamins and trace elements.  [place] [place] [facility] for now since increasing TPN rate this evening.    TPN lab panels on Mondays & Thursdays.  Lytes stable.  May consider checking Mg over the weekend  F/u LFTs  F/u Surgery plans for continued TPN vs PEG placement   JusAdrian SaranharmD, BCPS Pager [phone]/2016 10:04 AM

## 2015-04-09 NOTE — Progress Notes (Signed)
Patient ID: [name], female   DOB: 1933-07-04, 81 y.o.   MRN: [id number] 16 Days Post-Op  Subjective: Somewhat more alert today. Eating a few bites of breakfast with help. On questioning states her abdomen hurts. No apparent nausea or vomiting.  Objective: Vital signs in last 24 hours: Temp:  [97.6 F (36.4 C)-98 F (36.7 C)] 98 F (36.7 C) (05/28 0419) Pulse Rate:  [92-99] 95 (05/28 0419) Resp:  [18] 18 (05/28 0419) BP: (139-166)/(62-75) 149/66 mmHg (05/28 0542) SpO2:  [95 %-100 %] 99 % (05/28 0419) Last BM Date: 03/31/15  Intake/Output from previous day: 05/27 0701 - 05/28 0700 In: 1152.5 [P.O.:180; I.V.:242.5; IV Piggyback:100; TPN:600] Out: 74 [Urine:9; Drains:65] Intake/Output this shift:    General appearance: cooperative, no distress and slowed mentation GI: mild to moderate diffuse tenderness that is difficult to interpret Incision/Wound: clean and dry. Drain in place with serous drainage.  Lab Results:   Recent Labs  04/08/15 1213 04/09/15 0503  WBC 11.8* 11.7*  HGB 8.5* 7.7*  HCT 27.5* 24.9*  PLT 605* 538*   BMET  Recent Labs  04/08/15 0430 04/09/15 0503  NA 133* 133*  K 4.6 4.4  CL 104 104  CO2 21* 20*  GLUCOSE 121* 125*  BUN 43* 41*  CREATININE 0.77 0.70  CALCIUM 8.9 9.1   Recent Results (from the past 240 hour(s))  Culture, routine-abscess     Status: None   Collection Time: 04/03/15  3:18 PM  Result Value Ref Range Status   Specimen Description ABSCESS LEFT ABDOMEN UPPER  Final   Special Requests NONE  Final   Gram Stain   Final    MODERATE WBC PRESENT,BOTH PMN AND MONONUCLEAR NO SQUAMOUS EPITHELIAL CELLS SEEN NO ORGANISMS SEEN Performed at [facility]    Culture   Final    NO GROWTH 3 DAYS Performed at [facility]    Report Status 04/07/2015 FINAL  Final  Culture, routine-abscess     Status: None   Collection Time: 04/03/15  3:18 PM  Result Value Ref Range Status   Specimen Description ABSCESS ABDOMEN  MID  Final   Special Requests NONE  Final   Gram Stain   Final    ABUNDANT WBC PRESENT,BOTH PMN AND MONONUCLEAR NO SQUAMOUS EPITHELIAL CELLS SEEN NO ORGANISMS SEEN Performed at [facility]    Culture   Final    FEW ENTEROCOCCUS SPECIES Performed at [facility]    Report Status 04/07/2015 FINAL  Final   Organism ID, Bacteria ENTEROCOCCUS SPECIES  Final      Susceptibility   Enterococcus species - MIC*    VANCOMYCIN 2 SENSITIVE Sensitive     AMPICILLIN <=2 SENSITIVE Sensitive     * FEW ENTEROCOCCUS SPECIES  Culture, Urine     Status: None (Preliminary result)   Collection Time: 04/06/15  3:27 PM  Result Value Ref Range Status   Specimen Description URINE, CATHETERIZED  Final   Special Requests NONE  Final   Colony Count   Final    >=100,000 COLONIES/ML Performed at [facility]    Culture   Final    GRAM NEGATIVE RODS ENTEROCOCCUS SPECIES Performed at [facility]    Report Status PENDING  Incomplete  Culture, blood (routine x 2)     Status: None (Preliminary result)   Collection Time: 04/06/15  4:40 PM  Result Value Ref Range Status   Specimen Description BLOOD RIGHT ARM  Final   Special Requests BOTTLES DRAWN  AEROBIC AND ANAEROBIC 10CC  Final   Culture   Final           BLOOD CULTURE RECEIVED NO GROWTH TO DATE CULTURE WILL BE HELD FOR 5 DAYS BEFORE ISSUING A FINAL NEGATIVE REPORT Performed at [facility]    Report Status PENDING  Incomplete  Culture, blood (routine x 2)     Status: None (Preliminary result)   Collection Time: 04/06/15  4:50 PM  Result Value Ref Range Status   Specimen Description BLOOD RIGHT HAND  Final   Special Requests BOTTLES DRAWN AEROBIC AND ANAEROBIC 10CC  Final   Culture   Final           BLOOD CULTURE RECEIVED NO GROWTH TO DATE CULTURE WILL BE HELD FOR 5 DAYS BEFORE ISSUING A FINAL NEGATIVE REPORT Performed at [facility]    Report Status PENDING  Incomplete     Studies/Results: Ct  Abdomen Pelvis W Contrast  04/08/2015   CLINICAL DATA:  SOB, Abscess drains 04/04/2015. Surg: TAH, Gb, tubal, laparoscopy adhesion. Hx of aortic thoracic aneurysm. Elev WBC's, fever.  EXAM: CT ABDOMEN AND PELVIS WITH CONTRAST  TECHNIQUE: Multidetector CT imaging of the abdomen and pelvis was performed using the standard protocol following bolus administration of intravenous contrast.  CONTRAST:  OMNIPAQUE IOHEXOL 300 MG/ML  SOLN  COMPARISON:  04/03/2015 and earlier studies  FINDINGS: Small left pleural effusion. Consolidation/atelectasis with air bronchograms in the posterior and medial basal segments left lower lobe, and patchy airspace disease posteriorly at the right lung base. Coronary calcifications.  Stable percutaneous drain from intercostal approach posterior to the spleen, with some decrease in the subcapsular/subphrenic collection, with moderate residual. Catheter is at the dependent posterior aspect of the residual component.  Stable left mid abdominal percutaneous pigtail drain from an intercostal approach, with decrease in size of thick-walled complex fluid collection, residual 2.1 x 4.3 cm. Catheter appears well positioned centrally within the residual.  No new intra-abdominal collections. Surgical clips in the gallbladder fossa. Stable mild central intrahepatic biliary ductal dilatation and dilatation of the common duct. No focal liver lesion. Unremarkable spleen, adrenal glands, pancreas, kidneys. No hydronephrosis. Patchy aortoiliac calcifications without aneurysm or stenosis. Portal vein patent. Stomach, small bowel, and colon are nondilated. Multiple diverticula from the sigmoid colon without adjacent inflammatory/edematous change. The urinary bladder is incompletely distended. Bilateral pelvic phleboliths. Old healed pubic fractures. Spondylitic changes in the mid and lower lumbar spine, with mild dextroscoliosis.  IMPRESSION: 1. Some interval improvement in subphrenic and left mid abdomen  abscess collections post percutaneous drain placement x2. Both have residual undrained components but the drain catheters appear well positioned. 2. Persistent small left pleural effusion and bibasilar airspace disease left greater than right. 3. Atherosclerosis, including aortoiliac and coronary artery disease. Please note that although the presence of coronary artery calcium documents the presence of coronary artery disease, the severity of this disease and any potential stenosis cannot be assessed on this non-gated CT examination. Assessment for potential risk factor modification, dietary therapy or pharmacologic therapy may be warranted, if clinically indicated.   Electronically Signed   By: Corlis Leak M.D.   On: 04/08/2015 09:49    Anti-infectives: Anti-infectives    Start     Dose/Rate Route Frequency Ordered Stop   04/07/15 2000  vancomycin (VANCOCIN) 500 mg in sodium chloride 0.9 % 100 mL IVPB     500 mg 100 mL/hr over 60 Minutes Intravenous Every 12 hours 04/07/15 1859     04/07/15  1200  fluconazole (DIFLUCAN) IVPB 200 mg  Status:  Discontinued     200 mg 100 mL/hr over 60 Minutes Intravenous Every 24 hours 04/07/15 1120 04/08/15 1535   03/26/15 1600  cefTRIAXone (ROCEPHIN) 2 g in dextrose 5 % 50 mL IVPB - Premix    Comments:  Pharmacy may adjust dosing strength / duration / interval for maximal efficacy   2 g 100 mL/hr over 30 Minutes Intravenous Every 24 hours 03/26/15 1007 03/30/15 1535   03/26/15 1200  metroNIDAZOLE (FLAGYL) IVPB 500 mg     500 mg 100 mL/hr over 60 Minutes Intravenous Every 6 hours 03/26/15 1007 03/30/15 1850   03/25/15 1600  cefOXitin (MEFOXIN) 1 g in dextrose 5 % 50 mL IVPB  Status:  Discontinued     1 g 100 mL/hr over 30 Minutes Intravenous Every 12 hours 03/25/15 0801 03/26/15 1007   03/25/15 0800  cefOXitin (MEFOXIN) 1 g in dextrose 5 % 50 mL IVPB  Status:  Discontinued     1 g 100 mL/hr over 30 Minutes Intravenous 3 times per day 03/25/15 0752 03/25/15 0758    03/24/15 1700  cefOXitin (MEFOXIN) 1 g in dextrose 5 % 50 mL IVPB     1 g 100 mL/hr over 30 Minutes Intravenous Every 6 hours 03/24/15 1511 03/25/15 0512   03/24/15 0915  cefOXitin (MEFOXIN) 2 g in dextrose 5 % 50 mL IVPB    Comments:  Pharmacy may adjust dosing strength, interval, or rate of medication as needed for optimal therapy for the patient Send with patient on call to the OR.  Anesthesia to complete antibiotic administration <31min prior to incision per [facility].   2 g 100 mL/hr over 30 Minutes Intravenous On call to O.R. 03/24/15 0910 03/24/15 1042      Assessment/Plan: s/p Procedure(s): LAPAROSCOPY DIAGNOSTIC LAPAROSCOPIC LYSIS OF ADHESIONS LAPAROTOMY with decompression of bowel Postoperative mild CVA and decreased mental status Postoperative infected abdominal fluid collection with enterococcus. Infectious disease has recommended discontinuation of vancomycin. CT scan shows significant improvement but some residual fluid and drain remains in place. Poor by mouth intake and remains on TNA. Overall stable or gradual improvement    LOS: 22 days    Damonta Cossey T 04/09/2015

## 2015-04-09 NOTE — Progress Notes (Signed)
Progress Note   [name]:[id number] DOB: 1933-02-01 DOA: 03/18/2015 PCP: [name], MD   Brief Narrative:   [name] is an 81 y.o. female with a PMH of hypertension, CAD, hyperlipidemia and diabetes who was admitted 03/18/15 with SBO secondary to adhesions. She failed conservative therapy and underwent laparoscopic lysis of adhesions/laparotomy with bowel decompression on 03/24/15. Her hospital course was complicated by sepsis secondary to peritoneal contamination with transfer to the SDU on 03/25/15. She also developed left-sided facial droop/right arm weakness 03/27/15 with CT scan showing a 4 mm lacunar infarct in the left lentiform nucleus. MRI failed to show any abnormality. Due to failure to improve an ongoing high need for pain control, a repeat CT scan was done 04/02/15 which showed 2 large fluid collections. She underwent percutaneous drainage with placement of abscess drains by IR 04/04/15. Patient has fever on 5/25, but so far all cultures are negative and repeat CXR shows worsening atelectasis and no pneumonia. Repeat CT today shows improvement in the abscess. The abscess culture grew enterococcus and vancomycin started. ID consulted for recommendations. Since she has been afebrile for over 48 hours, recommended to stop the vancomycin.   Assessment/Plan:   Principal Problem:   Small bowel obstruction s/p exlap/LOA/decompression 03/25/2015 complicated by severe sepsis secondary to peritonitis / abdominal pain now complicated by intra-abdominal abscess/fluid collection - Status post laparoscopic lysis of adhesions/laparotomy with bowel decompression 03/24/15. - Subsequently developed acute peritonitis/sepsis. - Initially treated with Mefoxin starting 03/25/15. Antibiotics switched to Flagyl/Rocephin 03/26/15.   - CT scan of the abdomen/pelvis done 04/02/15 which showed 2 large fluid collections.  - 2 percutaneous drains placed by IR 04/03/15 to drain fluid collections.    -Repeat CT today shows improvement in the abscess. The abscess culture grew enterococcus and vancomycin started. ID consulted for recommendations. Plan to d/c vancomycin today as she remained afebrile and she is improving.   - afebrile and leukcytosis is improving.   Active Problems:   Facial droop / right arm weakness / possible acute CVA left lentiform nucleus - On 03/27/15, CT of the head done to evaluate symptoms showed an acute 4 mm lacunar infarct in the left lentiform nucleus. - Subsequent MRI negative, although clinical findings consistent with acute CVA. MRA negative for stenosis. - 2-D echo with normal EF, no diastolic dysfunction noted. No intracardiac masses or thrombi. - Carotid Dopplers negative for significant stenosis (1-39% bilaterally). - Hemoglobin A1c 6.3%. Cholesterol 99. - Evaluated by neurology, no further stroke workup recommended. - Continue aspirin. - PT/OT.    Hypokalemia/hypomagnesemia - Monitor and replace electrolytes as needed.    Acute postoperative blood loss anemia - Status post 2 units of PRBCs 04/01/15. - Continue to monitor hemoglobin and transfuse for hemoglobin less than 7.    Diabetes type 2, controlled - Currently being managed with moderate scale SSI every 6 hours. CBGs well controlled.  - Hemoglobin A1c 6.3%. CBG (last 3)   Recent Labs  04/09/15 0405 04/09/15 0737 04/09/15 1146  GLUCAP 112* 130* 104*        Anxiety state/Adjustment disorder with mixed anxiety and depressed mood - Mood has been depressed with limited motivation to participate in PT.    Coronary atherosclerosis - Continue aspirin therapy.    Acute renal failure secondary to sepsis  - Resolved with IV fluids.    Right foot pain  - Radiographs negative for fracture. Degenerative changes noted.    Polymyalgia rheumatica/Chronic fatigue disorder - PT/OT.  Elevated blood pressure - Continue as needed Lopressor.    Malnutrition of moderate degree - Continue  TPN for nutritional support. Begin to wean.    Acute delirium - Avoid sedating medications. Family requests patient does not get Ativan or Haldol.    Thrombocytopenia - Mild. Likely related to recent sepsis.    DVT Prophylaxis - Continue SCDs.  Code Status: Full. Family Communication: no family present today.   Disposition Plan: Home with daughter when diet advanced,.   IV Access:    PICC placed 03/23/15   Procedures and diagnostic studies:   Dg Chest 2 View  04/06/2015   CLINICAL DATA:  Fever  EXAM: CHEST  2 VIEW  COMPARISON:  03/27/2015  FINDINGS: Cardiac shadow is mildly enlarged. Left basilar atelectasis is again identified. Two recently placed drainage catheters are noted in the left upper abdomen. Mild right basilar atelectasis is noted. A left-sided PICC line is noted with the tip at the junction of the innominate veins. This has withdrawn slightly in the interval from the prior exam. No acute bony abnormality is seen.  IMPRESSION: Bibasilar atelectasis. This has increased in the interval from the prior exam.  Left upper quadrant drainage catheters.   Electronically Signed   By: Alcide Clever M.D.   On: 04/06/2015 14:57   Ct Head Wo Contrast  03/27/2015   CLINICAL DATA:  RIGHT-sided facial droop. Symptoms began earlier today. Altered mental status.  EXAM: CT HEAD WITHOUT CONTRAST  TECHNIQUE: Contiguous axial images were obtained from the base of the skull through the vertex without intravenous contrast.  COMPARISON:  05/27/2014.  FINDINGS: Possible acute 4 mm lacune, LEFT lentiform nucleus as seen on image 12 series 5. This was not clearly present on in 2015 scan.  No other areas of concern for cortical or subcortical infarction.  No hemorrhage, mass lesion, hydrocephalus, or extra-axial fluid.  Cerebral and cerebellar atrophy. Generalized white matter hypoattenuation, likely small vessel disease. Than the calvarium intact. Vascular calcification. No sinus or mastoid disease. Dense  lenticular opacities.  IMPRESSION: Possible acute 4 mm lacunar infarct LEFT lentiform nucleus. If further investigation desired, and no contraindications, consider MRI brain.   Electronically Signed   By: Davonna Belling M.D.   On: 03/27/2015 16:25   Mr Maxine Glenn Head Wo Contrast  03/28/2015   CLINICAL DATA:  81 year old with right-sided facial droop. Altered mental status. Subsequent encounter.  EXAM: MRI HEAD WITHOUT CONTRAST  MRA HEAD WITHOUT CONTRAST  TECHNIQUE: Multiplanar, multiecho pulse sequences of the brain and surrounding structures were obtained without intravenous contrast. Angiographic images of the head were obtained using MRA technique without contrast.  COMPARISON:  03/27/2015 head CT.  FINDINGS: MRI HEAD FINDINGS  No acute infarct.  No intracranial hemorrhage.  Remote tiny infarct right cerebellum. Minimal small vessel disease type changes.  Incidentally noted is a prominent peri vascular space left lenticular nucleus.  Global atrophy without hydrocephalus.  No intracranial mass lesion noted on this unenhanced exam.  Cervical medullary junction unremarkable. Mild spinal stenosis C3-4.  Expanded partially empty sella without other findings of pseudotumor cerebri  Pineal region unremarkable.  No intracranial mass lesion noted on this unenhanced exam.  MRA HEAD FINDINGS  Mild narrowing pre cavernous/ cavernous segment right internal carotid artery.  Mild ectasia left internal carotid artery cavernous segment. The minimal bulge along the left internal carotid artery cavernous segment most likely related to slight ectasia of the left ophthalmic artery origin rather than small aneurysm. Mild motion degradation limits evaluation.  Slightly ectatic  anterior communicating artery without aneurysm.  Middle cerebral artery mild branch vessel irregularity bilaterally.  No significant stenosis distal vertebral arteries or basilar artery. Regions of mild irregularity and slight narrowing.  IMPRESSION: MRI HEAD  No  acute infarct.  Remote tiny infarct right cerebellum.  Incidentally noted is a prominent peri vascular space left lenticular nucleus.  Global atrophy without hydrocephalus.  Expanded partially empty sella without other findings of pseudotumor cerebri  MRA HEAD FINDINGS  No medium or large size vessel significant stenosis or occlusion. Evaluation of branch vessels slightly limited by motion degradation.   Electronically Signed   By: Lacy Duverney M.D.   On: 03/28/2015 12:02   Mr Brain Wo Contrast  03/28/2015   CLINICAL DATA:  81 year old with right-sided facial droop. Altered mental status. Subsequent encounter.  EXAM: MRI HEAD WITHOUT CONTRAST  MRA HEAD WITHOUT CONTRAST  TECHNIQUE: Multiplanar, multiecho pulse sequences of the brain and surrounding structures were obtained without intravenous contrast. Angiographic images of the head were obtained using MRA technique without contrast.  COMPARISON:  03/27/2015 head CT.  FINDINGS: MRI HEAD FINDINGS  No acute infarct.  No intracranial hemorrhage.  Remote tiny infarct right cerebellum. Minimal small vessel disease type changes.  Incidentally noted is a prominent peri vascular space left lenticular nucleus.  Global atrophy without hydrocephalus.  No intracranial mass lesion noted on this unenhanced exam.  Cervical medullary junction unremarkable. Mild spinal stenosis C3-4.  Expanded partially empty sella without other findings of pseudotumor cerebri  Pineal region unremarkable.  No intracranial mass lesion noted on this unenhanced exam.  MRA HEAD FINDINGS  Mild narrowing pre cavernous/ cavernous segment right internal carotid artery.  Mild ectasia left internal carotid artery cavernous segment. The minimal bulge along the left internal carotid artery cavernous segment most likely related to slight ectasia of the left ophthalmic artery origin rather than small aneurysm. Mild motion degradation limits evaluation.  Slightly ectatic anterior communicating artery without  aneurysm.  Middle cerebral artery mild branch vessel irregularity bilaterally.  No significant stenosis distal vertebral arteries or basilar artery. Regions of mild irregularity and slight narrowing.  IMPRESSION: MRI HEAD  No acute infarct.  Remote tiny infarct right cerebellum.  Incidentally noted is a prominent peri vascular space left lenticular nucleus.  Global atrophy without hydrocephalus.  Expanded partially empty sella without other findings of pseudotumor cerebri  MRA HEAD FINDINGS  No medium or large size vessel significant stenosis or occlusion. Evaluation of branch vessels slightly limited by motion degradation.   Electronically Signed   By: Lacy Duverney M.D.   On: 03/28/2015 12:02   Ct Abdomen Pelvis W Contrast  04/08/2015   CLINICAL DATA:  SOB, Abscess drains 04/04/2015. Surg: TAH, Gb, tubal, laparoscopy adhesion. Hx of aortic thoracic aneurysm. Elev WBC's, fever.  EXAM: CT ABDOMEN AND PELVIS WITH CONTRAST  TECHNIQUE: Multidetector CT imaging of the abdomen and pelvis was performed using the standard protocol following bolus administration of intravenous contrast.  CONTRAST:  OMNIPAQUE IOHEXOL 300 MG/ML  SOLN  COMPARISON:  04/03/2015 and earlier studies  FINDINGS: Small left pleural effusion. Consolidation/atelectasis with air bronchograms in the posterior and medial basal segments left lower lobe, and patchy airspace disease posteriorly at the right lung base. Coronary calcifications.  Stable percutaneous drain from intercostal approach posterior to the spleen, with some decrease in the subcapsular/subphrenic collection, with moderate residual. Catheter is at the dependent posterior aspect of the residual component.  Stable left mid abdominal percutaneous pigtail drain from an intercostal approach, with decrease in  size of thick-walled complex fluid collection, residual 2.1 x 4.3 cm. Catheter appears well positioned centrally within the residual.  No new intra-abdominal collections. Surgical  clips in the gallbladder fossa. Stable mild central intrahepatic biliary ductal dilatation and dilatation of the common duct. No focal liver lesion. Unremarkable spleen, adrenal glands, pancreas, kidneys. No hydronephrosis. Patchy aortoiliac calcifications without aneurysm or stenosis. Portal vein patent. Stomach, small bowel, and colon are nondilated. Multiple diverticula from the sigmoid colon without adjacent inflammatory/edematous change. The urinary bladder is incompletely distended. Bilateral pelvic phleboliths. Old healed pubic fractures. Spondylitic changes in the mid and lower lumbar spine, with mild dextroscoliosis.  IMPRESSION: 1. Some interval improvement in subphrenic and left mid abdomen abscess collections post percutaneous drain placement x2. Both have residual undrained components but the drain catheters appear well positioned. 2. Persistent small left pleural effusion and bibasilar airspace disease left greater than right. 3. Atherosclerosis, including aortoiliac and coronary artery disease. Please note that although the presence of coronary artery calcium documents the presence of coronary artery disease, the severity of this disease and any potential stenosis cannot be assessed on this non-gated CT examination. Assessment for potential risk factor modification, dietary therapy or pharmacologic therapy may be warranted, if clinically indicated.   Electronically Signed   By: Corlis Leak M.D.   On: 04/08/2015 09:49   Ct Abdomen Pelvis W Contrast  04/02/2015   CLINICAL DATA:  Nonspecific abdominal pain. Status post recent laparotomy for small bowel obstruction.  EXAM: CT ABDOMEN AND PELVIS WITH CONTRAST  TECHNIQUE: Multidetector CT imaging of the abdomen and pelvis was performed using the standard protocol following bolus administration of intravenous contrast.  CONTRAST:  48mL OMNIPAQUE IOHEXOL 300 MG/ML SOLN, OMNIPAQUE IOHEXOL 300 MG/ML SOLN  COMPARISON:  Renal ultrasound dated 03/25/2015  and abdomen and pelvis CT dated 03/18/2015.  FINDINGS: Cholecystectomy clips. Interval midline surgical scar in the skin clips at the level of the pelvis. Loculated fluid with peripheral rim enhancement beneath the left hemidiaphragm and partially surrounding the spleen. This measures 7.7 x 5.3 cm on axial image 16. This measures 7.7 cm in length on coronal image number 92.  There is an additional fluid collection in the lateral aspect of the left mid abdomen, measuring 8.3 x 4.2 cm on axial image number 31. This also has peripheral rim enhancement. This measures 9.8 cm in length on coronal image number 64.  There are dilated small bowel loops in the lower abdomen and upper pelvis. Above the urinary bladder, one of these loops contains fluid, small amount of oral contrast and some gas. No definite separate fluid collection is seen.  A Foley catheter is in the urinary bladder with associated air in the bladder. No free peritoneal air seen. Atheromatous arterial calcifications, including the coronary arteries. Cholecystectomy clips.  Small left pleural effusion. Bilateral lower lobe atelectasis. Multiple colonic diverticula. Small left inguinal hernia containing fat. Diffuse subcutaneous edema. Lumbar and lower thoracic spine degenerative changes and scoliosis.  IMPRESSION: 1. 7.7 x 7.7 x 5.3 cm fluid collection with rim enhancement beneath the left hemidiaphragm and partially surrounding the spleen. This is suspicious for an abscess. 2. 9.8 x 8.3 x 4.2 cm arm left mid abdominal fluid collection with peripheral rim enhancement, suspicious for an abscess. 3. Focal ileus or partial obstruction involving small bowel in the inferior abdomen and upper pelvis. 4. Small left pleural effusion. 5. Bilateral lower lobe atelectasis. 6. Colonic diverticulosis.   Electronically Signed   By: Zada Finders.D.  On: 04/02/2015 17:38   Ct Abdomen Pelvis W Contrast  03/18/2015   CLINICAL DATA:  Nausea and vomiting for 4 days,  anxiety, status post hysterectomy CT pelvis 05/27/2014  EXAM: CT ABDOMEN AND PELVIS WITH CONTRAST  TECHNIQUE: Multidetector CT imaging of the abdomen and pelvis was performed using the standard protocol following bolus administration of intravenous contrast.  CONTRAST:  50mL OMNIPAQUE IOHEXOL 300 MG/ML SOLN, OMNIPAQUE IOHEXOL 300 MG/ML SOLN  COMPARISON:  None.  FINDINGS: Sagittal images of the spine shows diffuse osteopenia. Degenerative changes thoracolumbar spine. The lung bases are unremarkable.  The patient is status post cholecystectomy. Mild intrahepatic biliary ductal dilatation. There is CBD dilatation up to 1.1 cm. No focal hepatic mass. The pancreas, spleen and adrenal glands are unremarkable. Kidneys are symmetrical in size and enhancement. No hydronephrosis or hydroureter. Delayed renal images shows bilateral renal symmetrical excretion.  Atherosclerotic calcifications of abdominal aorta and iliac arteries. Moderate stool noted in right colon and proximal transverse colon. No colonic obstruction. The descending colon and sigmoid colon are empty collapsed. Some stool noted within rectum.  There are fluid distended small bowel loops in mid upper abdomen and left lower abdomen and pelvis. Small amount of fluid/stranding noted a adjacent to small bowel loops in left lower quadrant see axial image 57. Distal small bowel is decreased caliber collapsed. Findings are consistent with small bowel obstruction.  Mild distended urinary bladder. The patient is status post hysterectomy. The terminal ileum is small caliber decompressed. Contrast material is noted within stomach. No any contrast material is noted within small bowel or colon.  IMPRESSION: 1. There are fluid distended small bowel loops with multiple air-fluid levels highly suspicious for small bowel obstruction. Distal small bowel is small caliber decompressed. Terminal ileum is small caliber. Small amount of fluid/stranding noted adjacent to small  bowel loops in left lower quadrant please see images 49 and 57. 2. Moderate stool noted in right colon and proximal transverse colon. Descending colon and sigmoid colon are empty collapsed. 3. No pericecal inflammation. 4. No hydronephrosis or hydroureter. 5. Status post cholecystectomy. Mild intrahepatic biliary ductal dilatation. CBD dilatation up to 1.1 cm. 6. No hydronephrosis or hydroureter. 7. Status post hysterectomy. These results were called by telephone at the time of interpretation on 03/18/2015 at 8:59 am to Dr. Gerhard Munch , who verbally acknowledged these results.   Electronically Signed   By: Natasha Mead M.D.   On: 03/18/2015 08:59   US Renal  03/25/2015   CLINICAL DATA:  Acute renal failure.  EXAM: RENAL / URINARY TRACT ULTRASOUND COMPLETE  COMPARISON:  None.  FINDINGS: Right Kidney:  Length: 8.1 cm. Increased echogenicity consistent with chronic medical renal disease. No mass or hydronephrosis visualized.  Left Kidney:  Length: 9.3 cm. Increased echogenicity consistent chronic medical renal disease. No mass or hydronephrosis visualized.  Bladder:  Bladder decompressed by Foley catheter.  IMPRESSION: Bilateral echodense kidneys consistent chronic medical renal disease. No acute abnormality. No hydronephrosis or bladder distention.   Electronically Signed   By: Maisie Fus  Register   On: 03/25/2015 13:27   Dg Chest Port 1 View  03/27/2015   CLINICAL DATA:  Left PICC placement. Nausea and vomiting. Initial encounter.  EXAM: PORTABLE CHEST - 1 VIEW  COMPARISON:  Chest radiograph performed 03/26/2015  FINDINGS: The patient's left PICC is noted ending about the proximal SVC. An enteric tube is noted extending below the diaphragm.  The lungs are hypoexpanded. Bibasilar and right upper lung zone airspace opacities may reflect atelectasis  or pneumonia. No pleural effusion or pneumothorax is seen  The cardiomediastinal silhouette is borderline normal in size. No acute osseous abnormalities are identified.   IMPRESSION: 1. Left PICC noted ending about the proximal SVC. 2. Lungs hypoexpanded. Bibasilar and right upper lung zone airspace opacities may reflect atelectasis or pneumonia.  These results were called by telephone at the time of interpretation on 03/27/2015 at 7:33 pm to Nursing in the [facility], who verbally acknowledged these results.   Electronically Signed   By: Roanna Raider M.D.   On: 03/27/2015 19:34   Dg Chest Port 1 View  03/26/2015   CLINICAL DATA:  Atelectasis  EXAM: PORTABLE CHEST - 1 VIEW  COMPARISON:  Yesterday  FINDINGS: NG tube and left PICC are stable. Upper normal heart size. Low volumes. Bibasilar atelectasis is not significantly changed. No pneumothorax.  IMPRESSION: Stable bibasilar atelectasis.   Electronically Signed   By: Jolaine Click M.D.   On: 03/26/2015 08:14   Dg Chest Port 1 View  03/25/2015   CLINICAL DATA:  Tachycardia and hypotension; concern for sepsis  EXAM: PORTABLE CHEST - 1 VIEW  COMPARISON:  May 27, 2014  FINDINGS: There is patchy atelectasis in both lung bases. The degree of inspiration is shallow. There is no frank airspace consolidation. Heart is upper normal in size with pulmonary vascularity within normal limits.  Central catheter tip is in the superior vena cava. Nasogastric tube tip and side port are in the distal stomach. No pneumothorax.  IMPRESSION: Bibasilar atelectasis. No pneumothorax. Degree of inspiration shallow.   Electronically Signed   By: Bretta Bang III M.D.   On: 03/25/2015 09:03   Dg Abd Portable 1v  03/24/2015   CLINICAL DATA:  Small-bowel obstruction  EXAM: PORTABLE ABDOMEN - 1 VIEW  COMPARISON:  03/23/2015  FINDINGS: NG tube is been placed with the tip in the gastric antrum  Small bowel dilatation shows mild interval improvement. Colon is decompressed with moderate stool in the right colon. Surgical clips in the gallbladder fossa.  IMPRESSION: Mild improvement in small bowel obstruction. NG tube in the gastric antrum.    Electronically Signed   By: Marlan Palau M.D.   On: 03/24/2015 08:25   Dg Abd Portable 1v  03/23/2015   CLINICAL DATA:  Followup small bowel obstruction.  EXAM: PORTABLE ABDOMEN - 1 VIEW  COMPARISON:  03/22/2015  FINDINGS: Dilated loops small bowel are similar to the prior exam allowing for differences in radiographic technique different degrees of magnification.  There is a small amount of air in a nondistended colon.  IMPRESSION: 1. Persistent high-grade partial small bowel obstruction. No significant change from the previous day's study.   Electronically Signed   By: Amie Portland M.D.   On: 03/23/2015 10:01   Dg Abd Portable 1v  03/22/2015   CLINICAL DATA:  81 year old female with recent small bowel obstruction. Initial encounter.  EXAM: PORTABLE ABDOMEN - 1 VIEW  COMPARISON:  03/21/2015 and earlier, including CT Abdomen and Pelvis 03/18/2015.  FINDINGS: Portable AP supine view at 0517 hrs. Enteric tube has been removed. Recurrent dilated gas-filled small bowel loops in the mid abdomen up to 41 mm diameter. Abundant stool now at the splenic flexure. Decreased distal colon gas in the pelvis. Stable cholecystectomy clips. Stable abdominal and pelvic visceral contours. Grossly stable lung bases. Stable visualized osseous structures.  IMPRESSION: NG tube removed with recurrent small bowel obstruction.   Electronically Signed   By: Odessa Fleming M.D.   On:  03/22/2015 07:42   Dg Abd Portable 1v  03/21/2015   CLINICAL DATA:  Small-bowel obstruction .  EXAM: PORTABLE ABDOMEN - 1 VIEW  COMPARISON:  03/20/2015 .  FINDINGS: NG tube noted with tip in the upper portion stomach. Slight advancement should be considered . Surgical clips right upper quadrant Soft tissue structures are unremarkable. Small-bowel distention has improved. No free air. Stool noted throughout the colon. No acute bony abnormality.  IMPRESSION: 1. NG tube noted with tip in the upper portion of the stomach. Advancement suggested.  2. Interim  improvement of small bowel distention. Colonic gas pattern is normal with stool throughout the colon.  These results will be called to the ordering clinician or representative by the Radiologist Assistant, and communication documented in the PACS or zVision Dashboard.   Electronically Signed   By: Maisie Fus  Register   On: 03/21/2015 07:32   Dg Abd Portable 1v  03/20/2015   CLINICAL DATA:  Followup small bowel obstruction.  EXAM: PORTABLE ABDOMEN - 1 VIEW  COMPARISON:  03/19/2015  FINDINGS: Mild small bowel dilation persists most evident in the left upper quadrant. There has been no substantial change from the prior study. Residual contrast is noted in the bladder, also similar to the prior exam. Nasogastric tube tip projects in the distal stomach.  IMPRESSION: Small bowel dilation persists consistent with a persistent partial small bowel obstruction. No change from the previous day's study.   Electronically Signed   By: Amie Portland M.D.   On: 03/20/2015 07:29   Dg Abd Portable 1v  03/19/2015   CLINICAL DATA:  Small bowel obstruction protocol. Small bowel obstruction.  EXAM: PORTABLE ABDOMEN - 1 VIEW  COMPARISON:  03/18/2015.  FINDINGS: This is a 24 hr film. There is no contrast identified in the cecum. On the prior exam at 2046 hr yesterday, oral contrast was present in the stomach. Excreted contrast is present within the urinary bladder. Dilation of small bowel loops measures 36 mm. Cholecystectomy clips are present in the right upper quadrant.  There is less small bowel contrast than expected which may be due to dilution in the proximal small bowel. Nasogastric tube remains in the stomach with the tip at the antrum.  Large amount of stool is present in the colon.  IMPRESSION: Small bowel obstruction with no contrast identified in the cecum at 24 hr.   Electronically Signed   By: Andreas Newport M.D.   On: 03/19/2015 17:11   Dg Abd Portable 1v-small Bowel Obstruction Protocol-initial, 8 Hr Delay  03/19/2015    CLINICAL DATA:  Small bowel obstruction  EXAM: PORTABLE ABDOMEN - 1 VIEW  COMPARISON:  Radiographs and CT 03/18/2015  FINDINGS: Nasogastric tube extends into the distal stomach. There is contrast in the gastric lumen. There is no significant contrast in the small bowel or colon. Dilated stacked loops of small bowel persist in the mid abdomen, probably unchanged. No free air is evident on this single AP supine portable radiograph.  IMPRESSION: Enteric contrast has not reached the colon. Persistent small bowel dilatation consistent with small-bowel obstruction.   Electronically Signed   By: Ellery Plunk M.D.   On: 03/19/2015 01:48   Dg Abd Portable 1v-small Bowel Protocol-position Verification  03/18/2015   CLINICAL DATA:  Small bowel protocol, nasogastric tube placement  EXAM: PORTABLE ABDOMEN - 1 VIEW  COMPARISON:  Portable exam 1303 hours compared to CT abdomen and pelvis 03/18/2015  FINDINGS: Tip of nasogastric tube projects over distal gastric antrum or duodenal bulb, patient  slightly rotated to the RIGHT.  Excreted contrast material within renal collecting systems and distended urinary bladder.  Air-filled loops of dilated small bowel noted in the mid abdomen suspicious for small bowel obstruction.  Some gas and stool remain present within the colon.  Degenerative disc and facet disease changes thoracolumbar spine.  Osseous demineralization.  IMPRESSION: Tip of nasogastric tube projects over either the distal gastric antrum or the duodenal bulb region.  Dilated small bowel loops question small bowel obstruction.   Electronically Signed   By: Ulyses Southward M.D.   On: 03/18/2015 13:16   Dg Foot 2 Views Right  03/22/2015   CLINICAL DATA:  Dorsal right foot pain for 4 days, no known injury, initial encounter  EXAM: RIGHT FOOT - 2 VIEW  COMPARISON:  None.  FINDINGS: Degenerative changes are noted in the tarsal bones. No acute fracture or dislocation is noted. A small calcaneal spur is noted.  IMPRESSION:  Degenerative change without acute abnormality.   Electronically Signed   By: Alcide Clever M.D.   On: 03/22/2015 14:57   Dg Swallowing Func-speech Pathology  04/01/2015    Objective Swallowing Evaluation:    Patient Details  Name: [name] MRN: [id number] Date of Birth: May 21, 1933  Today's Date: 04/01/2015 Time: SLP Start Time (ACUTE ONLY): 0835-SLP Stop Time (ACUTE ONLY): 0857 SLP Time Calculation (min) (ACUTE ONLY): 22 min  Past Medical History:  Past Medical History  Diagnosis Date  . CAD (coronary artery disease)     s/p stenting of LAD 1999- cath 5-08 EF normal LAD 30-40% restenosis. D1  50% D2 80% LCX & RCA minimal plaque  . HTN (hypertension)   . Hyperlipemia   . Anemia     iron deficiency  . Depression   . DVT (deep venous thrombosis)   . Gout   . Osteoporosis   . Pancreatitis   . GERD (gastroesophageal reflux disease)   . Renal insufficiency     Cr 1.2-1.3  . Obesity   . Diabetes mellitus   . Polyarthritis     DJD/ possible PMR  . Vitamin D deficiency   . B12 deficiency   . Tinnitus   . Anxiety   . Aneurysm, thoracic aortic   . Constipation   . Urinary frequency   . Vertigo   . Chronic back pain    Past Surgical History:  Past Surgical History  Procedure Laterality Date  . Hemorrhoid surgery    . Abdominal hysterectomy    . Cholecystectomy    . Tubal ligation    . Coronary angioplasty with stent placement  1999    LAD stent  . Cardiac catheterization  2008    L main 20%, LAD stent patent, D1 50%, D2 80% (small), RCA 20%, EF 55-60%   . Laparoscopy N/A 03/24/2015    Procedure: LAPAROSCOPY DIAGNOSTIC;  Surgeon: Luretha Murphy, MD;   Location: [facility];  Service: General;  Laterality: N/A;  . Laparoscopic lysis of adhesions N/A 03/24/2015    Procedure: LAPAROSCOPIC LYSIS OF ADHESIONS;  Surgeon: Luretha Murphy,  MD;  Location: [facility];  Service: General;  Laterality: N/A;  . Laparotomy N/A 03/24/2015    Procedure: LAPAROTOMY with decompression of bowel;  Surgeon: Luretha Murphy, MD;  Location: [facility];  Service:  General;  Laterality: N/A;   HPI:  Other Pertinent Information: Pt is a 81 year old female admitted 03/18/15  with SBO secondary to adhesions. She failed conservative therapy and  underwent laparoscopic lysis of adhesions/laparotomy with  bowel  decompression on 03/24/15. Her hospital course was complicated by sepsis  secondary to peritoneal contamination with transfer to the SDU on 03/25/15.  She also developed left-sided facial droop/right arm weakness 03/27/15 with  CT scan showing a 4 mm lacunar infarct in the left lentiform nucleus. MRI  failed to show any abnormality. After removal of NG for suction, MD  ordered MBS prior to initiating diet.   No Data Recorded  Assessment / Plan / Recommendation CHL IP CLINICAL IMPRESSIONS 04/01/2015  Therapy Diagnosis Mild pharyngeal phase dysphagia;Mild oral phase  dysphagia  Clinical Impression Mild oral dysphagia characterized by decreased lingual  formation of bolus, improved with straw sips and improving progressively  during exam. oropharyngeal dysphagia also mild with no penetration or  aspiration. Suspect mild standing secretions intially during exam as  pharyngeal residuals decreased and timing of swallow improved with each  trial. Though swallow was initially delayed, by end of exam pt taking  large consecutive sips of thin with minimal delay and minimal residuals.  Recommend pt initiate a dys 3 (mechanical soft) diet with basic aspiration  precautions of upright posture and occasional second swallow to clear oral  cavity and orophrayngeal residuals. SLP will f/u for tolerance during  acute stay.       CHL IP TREATMENT RECOMMENDATION 04/01/2015  Treatment Recommendations Therapy as outlined in treatment plan below     CHL IP DIET RECOMMENDATION 04/01/2015  SLP Diet Recommendations Dysphagia 3 (Mech soft);Thin  Liquid Administration via (None)  Medication Administration Whole meds with liquid  Compensations Multiple dry swallows after each bite/sip  Postural Changes and/or  Swallow Maneuvers (None)     CHL IP OTHER RECOMMENDATIONS 04/01/2015  Recommended Consults (None)  Oral Care Recommendations Oral care BID  Other Recommendations (None)     CHL IP FOLLOW UP RECOMMENDATIONS 03/29/2015  Follow up Recommendations 24 hour supervision/assistance     CHL IP FREQUENCY AND DURATION 04/01/2015  Speech Therapy Frequency (ACUTE ONLY) min 2x/week  Treatment Duration 2 weeks     Pertinent Vitals/Pain NA    SLP Swallow Goals No flowsheet data found.  No flowsheet data found.    CHL IP REASON FOR REFERRAL 04/01/2015  Reason for Referral Objectively evaluate swallowing function     CHL IP ORAL PHASE 04/01/2015  Lips (None)  Tongue (None)  Mucous membranes (None)  Nutritional status (None)  Other (None)  Oxygen therapy (None)  Oral Phase Impaired  Oral - Pudding Teaspoon (None)  Oral - Pudding Cup (None)  Oral - Honey Teaspoon (None)  Oral - Honey Cup (None)  Oral - Honey Syringe (None)  Oral - Nectar Teaspoon (None)  Oral - Nectar Cup (None)  Oral - Nectar Straw (None)  Oral - Nectar Syringe (None)  Oral - Ice Chips (None)  Oral - Thin Teaspoon (None)  Oral - Thin Cup (None)  Oral - Thin Straw (None)  Oral - Thin Syringe (None)  Oral - Puree (None)  Oral - Mechanical Soft (None)  Oral - Regular (None)  Oral - Multi-consistency (None)  Oral - Pill (None)  Oral Phase - Comment (None)      CHL IP PHARYNGEAL PHASE 04/01/2015  Pharyngeal Phase Impaired  Pharyngeal - Pudding Teaspoon (None)  Penetration/Aspiration details (pudding teaspoon) (None)  Pharyngeal - Pudding Cup (None)  Penetration/Aspiration details (pudding cup) (None)  Pharyngeal - Honey Teaspoon (None)  Penetration/Aspiration details (honey teaspoon) (None)  Pharyngeal - Honey Cup (None)  Penetration/Aspiration details (honey cup) (None)  Pharyngeal - Honey Syringe (  None)  Penetration/Aspiration details (honey syringe) (None)  Pharyngeal - Nectar Teaspoon (None)  Penetration/Aspiration details (nectar teaspoon) (None)  Pharyngeal - Nectar Cup  (None)  Penetration/Aspiration details (nectar cup) (None)  Pharyngeal - Nectar Straw (None)  Penetration/Aspiration details (nectar straw) (None)  Pharyngeal - Nectar Syringe (None)  Penetration/Aspiration details (nectar syringe) (None)  Pharyngeal - Ice Chips (None)  Penetration/Aspiration details (ice chips) (None)  Pharyngeal - Thin Teaspoon (None)  Penetration/Aspiration details (thin teaspoon) (None)  Pharyngeal - Thin Cup (None)  Penetration/Aspiration details (thin cup) (None)  Pharyngeal - Thin Straw (None)  Penetration/Aspiration details (thin straw) (None)  Pharyngeal - Thin Syringe (None)  Penetration/Aspiration details (thin syringe') (None)  Pharyngeal - Puree (None)  Penetration/Aspiration details (puree) (None)  Pharyngeal - Mechanical Soft (None)  Penetration/Aspiration details (mechanical soft) (None)  Pharyngeal - Regular (None)  Penetration/Aspiration details (regular) (None)  Pharyngeal - Multi-consistency (None)  Penetration/Aspiration details (multi-consistency) (None)  Pharyngeal - Pill (None)  Penetration/Aspiration details (pill) (None)  Pharyngeal Comment (None)      No flowsheet data found.  No flowsheet data found.        Harlon Ditty, [place] CCC-SLP [phone]  Claudine Mouton 04/01/2015, 10:31 AM    Ct Image Guided Drainage Percut Cath  Peritoneal Retroperit  04/04/2015   CLINICAL DATA:  81 year old with postoperative fluid collections. Concern for intra-abdominal abscesses.  EXAM: CT-GUIDED DRAIN PLACEMENT IN LEFT UPPER ABDOMINAL FLUID COLLECTION  CT-GUIDED DRAIN PLACEMENT IN LEFT LATERAL ABDOMINAL FLUID COLLECTION  Physician: Rachelle Hora. Lowella Dandy, MD  FLUOROSCOPY TIME:  None  MEDICATIONS: 4 mg versed, 100 mcg of fentanyl. A radiology nurse monitored the patient for moderate sedation.  ANESTHESIA/SEDATION: Moderate sedation time: 50 min  PROCEDURE: Informed consent was obtained for CT-guided drain placements. Patient was placed supine on the CT scanner. Images through the abdomen were  obtained. The collections in the left upper abdomen and lateral left abdomen were identified. The left side of the abdomen was prepped and draped in sterile fashion. The collection in the left upper abdomen and perisplenic region was initially targeted. Skin was anesthetized with 1% lidocaine. 18 gauge needle was directed into the fluid collection just posterior to the spleen and cloudy yellow fluid was aspirated. A stiff Amplatz wire was placed and the tract was dilated to accommodate a 10.2 [place] multipurpose drain. Catheter sutured to skin and attached to a suction bulb.  Attention was directed to the left lateral abdominal fluid collection. The left lateral abdomen was anesthetized with 1% lidocaine. 18 gauge needle directed into the fluid collection and cloudy yellow fluid was aspirated. Stiff Amplatz wire was placed and the tract was dilated to accommodate a 10.2 [place] multipurpose drain. Catheter sutured to skin and attached to a suction bulb.  FINDINGS: Left upper abdominal fluid collection in the perisplenic region. Drain was successfully placed just posterior to the spleen. There is a second collection in the lateral left mid abdomen. Drain successfully placed in this collection.  Estimated blood loss: Minimal  COMPLICATIONS: None  IMPRESSION: Placement of CT-guided drainage catheters in two left abdominal fluid collections. Cloudy yellow fluid was removed from both collections. Samples were sent for culture.   Electronically Signed   By: Richarda Overlie M.D.   On: 04/04/2015 08:32   Ct Image Guided Drainage Percut Cath  Peritoneal Retroperit  04/04/2015   CLINICAL DATA:  81 year old with postoperative fluid collections. Concern for intra-abdominal abscesses.  EXAM: CT-GUIDED DRAIN PLACEMENT IN LEFT UPPER ABDOMINAL FLUID COLLECTION  CT-GUIDED DRAIN PLACEMENT IN LEFT  LATERAL ABDOMINAL FLUID COLLECTION  Physician: Rachelle Hora. Lowella Dandy, MD  FLUOROSCOPY TIME:  None  MEDICATIONS: 4 mg versed, 100 mcg of fentanyl. A  radiology nurse monitored the patient for moderate sedation.  ANESTHESIA/SEDATION: Moderate sedation time: 50 min  PROCEDURE: Informed consent was obtained for CT-guided drain placements. Patient was placed supine on the CT scanner. Images through the abdomen were obtained. The collections in the left upper abdomen and lateral left abdomen were identified. The left side of the abdomen was prepped and draped in sterile fashion. The collection in the left upper abdomen and perisplenic region was initially targeted. Skin was anesthetized with 1% lidocaine. 18 gauge needle was directed into the fluid collection just posterior to the spleen and cloudy yellow fluid was aspirated. A stiff Amplatz wire was placed and the tract was dilated to accommodate a 10.2 [place] multipurpose drain. Catheter sutured to skin and attached to a suction bulb.  Attention was directed to the left lateral abdominal fluid collection. The left lateral abdomen was anesthetized with 1% lidocaine. 18 gauge needle directed into the fluid collection and cloudy yellow fluid was aspirated. Stiff Amplatz wire was placed and the tract was dilated to accommodate a 10.2 [place] multipurpose drain. Catheter sutured to skin and attached to a suction bulb.  FINDINGS: Left upper abdominal fluid collection in the perisplenic region. Drain was successfully placed just posterior to the spleen. There is a second collection in the lateral left mid abdomen. Drain successfully placed in this collection.  Estimated blood loss: Minimal  COMPLICATIONS: None  IMPRESSION: Placement of CT-guided drainage catheters in two left abdominal fluid collections. Cloudy yellow fluid was removed from both collections. Samples were sent for culture.   Electronically Signed   By: Richarda Overlie M.D.   On: 04/04/2015 08:32     Medical Consultants:    Surgery - Dr. Glenna Fellows   Cardiology   PCCM  Neurology - Dr. Thana Farr   ID  Anti-Infectives:    Mefoxin  03/25/2015 --> 03/26/2015  Rocephin 03/26/2015 -->   Flagyl 03/26/2015 -->  Subjective:   [name] DENIES ANY NAUSEA OR VOMITING. ABDOMINAL PAIN HAS IMPROVED.   Objective:    Filed Vitals:   04/09/15 0542 04/09/15 0937 04/09/15 1259 04/09/15 1500  BP: 149/66 137/71 150/70 148/60  Pulse:  99 102 91  Temp:  98.7 F (37.1 C)    TempSrc:  Oral    Resp:  18  17  Height:      Weight:      SpO2:  100%  100%    Intake/Output Summary (Last 24 hours) at 04/09/15 1538 Last data filed at 04/09/15 1500  Gross per 24 hour  Intake 1212.5 ml  Output     69 ml  Net 1143.5 ml    Exam: Gen:  NAD Cardiovascular:  RRR, No M/R/G Respiratory:  Lungs CTAB Gastrointestinal:  Abdomen softly distended,  No tenderness, + BS Extremities:  1+ edema   Data Reviewed:    Labs: Basic Metabolic Panel:  Recent Labs Lab 04/04/15 0650 04/05/15 1000 04/06/15 0445 04/07/15 0454 04/08/15 0430 04/09/15 0503  NA 132* 132* 132* 135 133* 133*  K 4.7 4.5 4.8 5.0 4.6 4.4  CL 103 103 102 104 104 104  CO2 21* 21* 20*  GLUCOSE 174* 113* 153* 136* 121* 125*  BUN 46* 46* 47* 46* 43* 41*  CREATININE 0.83 0.83 0.74 0.82 0.77 0.70  CALCIUM 8.2* 8.3* 8.7* 8.9 8.9 9.1  MG  1.5* 2.1  --  1.6* 1.9  --   PHOS 3.8 4.4  --  4.2  --   --    GFR Estimated Creatinine Clearance: 53.9 mL/min (by C-G formula based on Cr of 0.7). Liver Function Tests:  Recent Labs Lab 04/04/15 0650 04/07/15 0454 04/09/15 0503  AST 45* 43* 39  ALT 26 32 31  ALKPHOS 139* 204* 199*  BILITOT 0.5 0.4 0.4  PROT 6.8 7.0 7.2  ALBUMIN 1.9* 2.0* 1.9*   Coagulation profile  Recent Labs Lab 04/03/15 1140  INR 1.22    CBC:  Recent Labs Lab 04/04/15 0650 04/06/15 0445 04/07/15 0454 04/08/15 1213 04/09/15 0503  WBC 16.7* 14.3* 13.9* 11.8* 11.7*  NEUTROABS 15.3*  --  12.4*  --   --   HGB 8.8* 8.5* 7.8* 8.5* 7.7*  HCT 28.3* 27.4* 25.8* 27.5* 24.9*  MCV 83.2 82.8 82.4 82.8 82.5  PLT 465* 530* 506* 605*  538*   Cardiac Enzymes: No results for input(s): CKTOTAL, CKMB, CKMBINDEX, TROPONINI in the last 168 hours. CBG:  Recent Labs Lab 04/08/15 1956 04/09/15 04/09/15 0405 04/09/15 0737 04/09/15 1146  GLUCAP 120* 133* 112* 130* 104*   Sepsis Labs:  Recent Labs Lab 04/06/15 0445 04/07/15 0454 04/08/15 1213 04/09/15 0503  WBC 14.3* 13.9* 11.8* 11.7*   Microbiology Recent Results (from the past 240 hour(s))  Culture, routine-abscess     Status: None   Collection Time: 04/03/15  3:18 PM  Result Value Ref Range Status   Specimen Description ABSCESS LEFT ABDOMEN UPPER  Final   Special Requests NONE  Final   Gram Stain   Final    MODERATE WBC PRESENT,BOTH PMN AND MONONUCLEAR NO SQUAMOUS EPITHELIAL CELLS SEEN NO ORGANISMS SEEN Performed at [facility]    Culture   Final    NO GROWTH 3 DAYS Performed at [facility]    Report Status 04/07/2015 FINAL  Final  Culture, routine-abscess     Status: None   Collection Time: 04/03/15  3:18 PM  Result Value Ref Range Status   Specimen Description ABSCESS ABDOMEN MID  Final   Special Requests NONE  Final   Gram Stain   Final    ABUNDANT WBC PRESENT,BOTH PMN AND MONONUCLEAR NO SQUAMOUS EPITHELIAL CELLS SEEN NO ORGANISMS SEEN Performed at [facility]    Culture   Final    FEW ENTEROCOCCUS SPECIES Performed at [facility]    Report Status 04/07/2015 FINAL  Final   Organism ID, Bacteria ENTEROCOCCUS SPECIES  Final      Susceptibility   Enterococcus species - MIC*    VANCOMYCIN 2 SENSITIVE Sensitive     AMPICILLIN <=2 SENSITIVE Sensitive     * FEW ENTEROCOCCUS SPECIES  Culture, Urine     Status: None (Preliminary result)   Collection Time: 04/06/15  3:27 PM  Result Value Ref Range Status   Specimen Description URINE, CATHETERIZED  Final   Special Requests NONE  Final   Colony Count   Final    >=100,000 COLONIES/ML Performed at [facility]    Culture   Final    GRAM NEGATIVE  RODS ENTEROCOCCUS SPECIES Performed at [facility]    Report Status PENDING  Incomplete  Culture, blood (routine x 2)     Status: None (Preliminary result)   Collection Time: 04/06/15  4:40 PM  Result Value Ref Range Status   Specimen Description BLOOD RIGHT ARM  Final   Special Requests BOTTLES DRAWN AEROBIC AND  ANAEROBIC 10CC  Final   Culture   Final           BLOOD CULTURE RECEIVED NO GROWTH TO DATE CULTURE WILL BE HELD FOR 5 DAYS BEFORE ISSUING A FINAL NEGATIVE REPORT Performed at [facility]    Report Status PENDING  Incomplete  Culture, blood (routine x 2)     Status: None (Preliminary result)   Collection Time: 04/06/15  4:50 PM  Result Value Ref Range Status   Specimen Description BLOOD RIGHT HAND  Final   Special Requests BOTTLES DRAWN AEROBIC AND ANAEROBIC 10CC  Final   Culture   Final           BLOOD CULTURE RECEIVED NO GROWTH TO DATE CULTURE WILL BE HELD FOR 5 DAYS BEFORE ISSUING A FINAL NEGATIVE REPORT Performed at [facility]    Report Status PENDING  Incomplete     Medications:   . antiseptic oral rinse  7 mL Mouth Rinse q12n4p  . aspirin  300 mg Rectal Daily   Or  . aspirin  325 mg Oral Daily  . bisacodyl  10 mg Rectal Daily  . chlorhexidine  15 mL Mouth Rinse BID  . docusate sodium  100 mg Oral BID  . feeding supplement (ENSURE ENLIVE)  237 mL Oral BID BM  . insulin aspart  0-15 Units Subcutaneous 6 times per day  . lip balm  1 application Topical BID  . metoprolol  2.5 mg Intravenous 4 times per day  . polyethylene glycol  17 g Oral Daily   Continuous Infusions: . sodium chloride 10 mL/hr (03/29/15 0800)  . TPN (CLINIMIX) Adult without lytes 40 mL/hr at 04/08/15 1742   And  . fat emulsion 240 mL (04/08/15 1743)  . TPN (CLINIMIX) Adult without lytes     And  . fat emulsion      Time spent: 25 minutes.   LOS: 22 days   [facility]  [facility] Pager [phone]  If 7PM-7AM, please contact night-coverage at  [email or web address], password [email or web address] for any overnight needs.  04/09/2015, 3:38 PM

## 2015-04-09 NOTE — Progress Notes (Signed)
[facility]  Date of Admission:  03/18/2015  Antibiotics: IV vancomycin  Subjective: No acute issues  Objective: Temp:  [97.6 F (36.4 C)-98.7 F (37.1 C)] 98.7 F (37.1 C) (05/28 0937) Pulse Rate:  [92-102] 102 (05/28 1259) Resp:  [18] 18 (05/28 0937) BP: (137-166)/(62-75) 150/70 mmHg (05/28 1259) SpO2:  [95 %-100 %] 100 % (05/28 0937)  General: awake Skin: no rashes Abdomen: soft, some tenderness    Lab Results Lab Results  Component Value Date   WBC 11.7* 04/09/2015   HGB 7.7* 04/09/2015   HCT 24.9* 04/09/2015   MCV 82.5 04/09/2015   PLT 538* 04/09/2015    Lab Results  Component Value Date   CREATININE 0.70 04/09/2015   BUN 41* 04/09/2015   NA 133* 04/09/2015   K 4.4 04/09/2015   CL 104 04/09/2015   CO2 20* 04/09/2015    Lab Results  Component Value Date   ALT 31 04/09/2015   AST 39 04/09/2015   ALKPHOS 199* 04/09/2015   BILITOT 0.4 04/09/2015      Microbiology: Recent Results (from the past 240 hour(s))  Culture, routine-abscess     Status: None   Collection Time: 04/03/15  3:18 PM  Result Value Ref Range Status   Specimen Description ABSCESS LEFT ABDOMEN UPPER  Final   Special Requests NONE  Final   Gram Stain   Final    MODERATE WBC PRESENT,BOTH PMN AND MONONUCLEAR NO SQUAMOUS EPITHELIAL CELLS SEEN NO ORGANISMS SEEN Performed at [facility]    Culture   Final    NO GROWTH 3 DAYS Performed at [facility]    Report Status 04/07/2015 FINAL  Final  Culture, routine-abscess     Status: None   Collection Time: 04/03/15  3:18 PM  Result Value Ref Range Status   Specimen Description ABSCESS ABDOMEN MID  Final   Special Requests NONE  Final   Gram Stain   Final    ABUNDANT WBC PRESENT,BOTH PMN AND MONONUCLEAR NO SQUAMOUS EPITHELIAL CELLS SEEN NO ORGANISMS SEEN Performed at [facility]    Culture   Final    FEW ENTEROCOCCUS SPECIES Performed at [facility]    Report Status  04/07/2015 FINAL  Final   Organism ID, Bacteria ENTEROCOCCUS SPECIES  Final      Susceptibility   Enterococcus species - MIC*    VANCOMYCIN 2 SENSITIVE Sensitive     AMPICILLIN <=2 SENSITIVE Sensitive     * FEW ENTEROCOCCUS SPECIES  Culture, Urine     Status: None (Preliminary result)   Collection Time: 04/06/15  3:27 PM  Result Value Ref Range Status   Specimen Description URINE, CATHETERIZED  Final   Special Requests NONE  Final   Colony Count   Final    >=100,000 COLONIES/ML Performed at [facility]    Culture   Final    GRAM NEGATIVE RODS ENTEROCOCCUS SPECIES Performed at [facility]    Report Status PENDING  Incomplete  Culture, blood (routine x 2)     Status: None (Preliminary result)   Collection Time: 04/06/15  4:40 PM  Result Value Ref Range Status   Specimen Description BLOOD RIGHT ARM  Final   Special Requests BOTTLES DRAWN AEROBIC AND ANAEROBIC 10CC  Final   Culture   Final           BLOOD CULTURE RECEIVED NO GROWTH TO DATE CULTURE WILL BE HELD FOR 5 DAYS BEFORE ISSUING A FINAL NEGATIVE  REPORT Performed at [facility]    Report Status PENDING  Incomplete  Culture, blood (routine x 2)     Status: None (Preliminary result)   Collection Time: 04/06/15  4:50 PM  Result Value Ref Range Status   Specimen Description BLOOD RIGHT HAND  Final   Special Requests BOTTLES DRAWN AEROBIC AND ANAEROBIC 10CC  Final   Culture   Final           BLOOD CULTURE RECEIVED NO GROWTH TO DATE CULTURE WILL BE HELD FOR 5 DAYS BEFORE ISSUING A FINAL NEGATIVE REPORT Performed at [facility]    Report Status PENDING  Incomplete    Studies/Results: Ct Abdomen Pelvis W Contrast  04/08/2015   CLINICAL DATA:  SOB, Abscess drains 04/04/2015. Surg: TAH, Gb, tubal, laparoscopy adhesion. Hx of aortic thoracic aneurysm. Elev WBC's, fever.  EXAM: CT ABDOMEN AND PELVIS WITH CONTRAST  TECHNIQUE: Multidetector CT imaging of the abdomen and pelvis was performed using  the standard protocol following bolus administration of intravenous contrast.  CONTRAST:  OMNIPAQUE IOHEXOL 300 MG/ML  SOLN  COMPARISON:  04/03/2015 and earlier studies  FINDINGS: Small left pleural effusion. Consolidation/atelectasis with air bronchograms in the posterior and medial basal segments left lower lobe, and patchy airspace disease posteriorly at the right lung base. Coronary calcifications.  Stable percutaneous drain from intercostal approach posterior to the spleen, with some decrease in the subcapsular/subphrenic collection, with moderate residual. Catheter is at the dependent posterior aspect of the residual component.  Stable left mid abdominal percutaneous pigtail drain from an intercostal approach, with decrease in size of thick-walled complex fluid collection, residual 2.1 x 4.3 cm. Catheter appears well positioned centrally within the residual.  No new intra-abdominal collections. Surgical clips in the gallbladder fossa. Stable mild central intrahepatic biliary ductal dilatation and dilatation of the common duct. No focal liver lesion. Unremarkable spleen, adrenal glands, pancreas, kidneys. No hydronephrosis. Patchy aortoiliac calcifications without aneurysm or stenosis. Portal vein patent. Stomach, small bowel, and colon are nondilated. Multiple diverticula from the sigmoid colon without adjacent inflammatory/edematous change. The urinary bladder is incompletely distended. Bilateral pelvic phleboliths. Old healed pubic fractures. Spondylitic changes in the mid and lower lumbar spine, with mild dextroscoliosis.  IMPRESSION: 1. Some interval improvement in subphrenic and left mid abdomen abscess collections post percutaneous drain placement x2. Both have residual undrained components but the drain catheters appear well positioned. 2. Persistent small left pleural effusion and bibasilar airspace disease left greater than right. 3. Atherosclerosis, including aortoiliac and coronary artery  disease. Please note that although the presence of coronary artery calcium documents the presence of coronary artery disease, the severity of this disease and any potential stenosis cannot be assessed on this non-gated CT examination. Assessment for potential risk factor modification, dietary therapy or pharmacologic therapy may be warranted, if clinically indicated.   Electronically Signed   By: Corlis Leak M.D.   On: 04/08/2015 09:49    Assessment/Plan:  1) Fluid collection - she remains afebrile.  Will stop vancomycin and observe off of antibiotics.    Staci Righter, MD [facility] [email or web address] [phone] pager   [phone] cell 04/09/2015, 3:01 PM

## 2015-04-10 DIAGNOSIS — B9689 Other specified bacterial agents as the cause of diseases classified elsewhere: Secondary | ICD-10-CM

## 2015-04-10 DIAGNOSIS — R509 Fever, unspecified: Secondary | ICD-10-CM | POA: Insufficient documentation

## 2015-04-10 LAB — BASIC METABOLIC PANEL
Anion gap: 8 (ref 5–15)
BUN: 40 mg/dL — ABNORMAL HIGH (ref 6–20)
CO2: 20 mmol/L — AB (ref 22–32)
Calcium: 9 mg/dL (ref 8.9–10.3)
Chloride: 106 mmol/L (ref 101–111)
Creatinine, Ser: 0.71 mg/dL (ref 0.44–1.00)
GFR calc Af Amer: >60 mL/min (ref >60)
GFR calc non Af Amer: >60 mL/min (ref >60)
Glucose, Bld: 131 mg/dL — ABNORMAL HIGH (ref 65–99)
POTASSIUM: 4.1 mmol/L (ref 3.5–5.1)
SODIUM: 134 mmol/L — AB (ref 135–145)

## 2015-04-10 LAB — GLUCOSE, CAPILLARY
GLUCOSE-CAPILLARY: 173 mg/dL — AB (ref 65–99)
Glucose-Capillary: 126 mg/dL — ABNORMAL HIGH (ref 65–99)
Glucose-Capillary: 152 mg/dL — ABNORMAL HIGH (ref 65–99)
Glucose-Capillary: 164 mg/dL — ABNORMAL HIGH (ref 65–99)
Glucose-Capillary: 127 mg/dL — ABNORMAL HIGH (ref 65–99)

## 2015-04-10 LAB — MAGNESIUM: MAGNESIUM: 1.5 mg/dL — AB (ref 1.7–2.4)

## 2015-04-10 LAB — PHOSPHORUS: Phosphorus: 4.2 mg/dL (ref 2.5–4.6)

## 2015-04-10 MED ORDER — FAT EMULSION 20 % IV EMUL
240.0000 mL | INTRAVENOUS | Status: DC
  Filled 2015-04-10: qty 250

## 2015-04-10 MED ORDER — MORPHINE SULFATE 2 MG/ML IJ SOLN
1.0000 mg | INTRAMUSCULAR | Status: DC | PRN
  Filled 2015-04-10: qty 1

## 2015-04-10 MED ORDER — FAT EMULSION 20 % IV EMUL
240.0000 mL | INTRAVENOUS | Status: AC
  Administered 2015-04-10: 240 mL via INTRAVENOUS
  Filled 2015-04-10: qty 250

## 2015-04-10 MED ORDER — POLYETHYLENE GLYCOL 3350 17 G PO PACK
17.0000 g | PACK | Freq: Two times a day (BID) | ORAL | Status: DC
  Administered 2015-04-10 – 2015-04-20 (×10): 17 g via ORAL
  Filled 2015-04-10 (×13): qty 1

## 2015-04-10 MED ORDER — ACETAMINOPHEN 325 MG PO TABS
650.0000 mg | ORAL_TABLET | ORAL | Status: DC | PRN
Start: 2015-04-10 — End: 2015-04-12
  Administered 2015-04-10 – 2015-04-12 (×8): 650 mg via ORAL
  Filled 2015-04-10 (×9): qty 2

## 2015-04-10 MED ORDER — FLEET ENEMA 7-19 GM/118ML RE ENEM
1.0000 | ENEMA | Freq: Once | RECTAL | Status: AC
  Administered 2015-04-10: 1 via RECTAL
  Filled 2015-04-10: qty 1

## 2015-04-10 MED ORDER — MAGNESIUM SULFATE 2 GM/50ML IV SOLN
2.0000 g | Freq: Once | INTRAVENOUS | Status: AC
  Administered 2015-04-10: 2 g via INTRAVENOUS
  Filled 2015-04-10: qty 50

## 2015-04-10 MED ORDER — TRACE MINERALS CR-CU-MN-SE-ZN 10-1000-500-60 MCG/ML IV SOLN
INTRAVENOUS | Status: AC
  Administered 2015-04-10: 18:00:00 via INTRAVENOUS
  Filled 2015-04-10: qty 1680

## 2015-04-10 MED ORDER — M.V.I. ADULT IV INJ
INJECTION | INTRAVENOUS | Status: DC
  Filled 2015-04-10: qty 1680

## 2015-04-10 NOTE — Progress Notes (Addendum)
PARENTERAL NUTRITION CONSULT NOTE - follow up  Pharmacy Consult for TPN Indication: bowel obstruction  Allergies  Allergen Reactions  . Amlodipine Besylate     REACTION: dizzy  . Aspirin   . Atenolol     REACTION: fatigue  . Benazepril     cough  . Benicar [Olmesartan Medoxomil]     HA  . Cozaar     nausea  . Hydrochlorothiazide W-Triamterene     REACTION: dizzy  . Hydrocodone     REACTION: HA  . Hydroxyzine Pamoate   . Iodine   . Lisinopril     REACTION: tired, cough  . Penicillins Itching    tolerates cephalosporins OK  . Pravastatin     myalgias  . Prednisolone     My stomach hurts  . Tramadol Hcl     REACTION: HA    Patient Measurements: Height: _0  (162.6 cm) Weight: 149 lb 4 oz (67.7 kg) IBW/kg (Calculated) : 54.7 Adjusted Body Weight: 57.5kg   Vital Signs: Temp: 99.5 F (37.5 C) (05/29 0500) Temp Source: Oral (05/29 0500) BP: 163/85 mmHg (05/29 0500) Pulse Rate: 95 (05/29 0500) Intake/Output from previous day: 05/28 0701 - 05/29 0700 In: 1961.9 [P.O.:360; I.V.:220.3; TPN:1351.6] Out: 43 [Urine:5; Drains:38] Intake/Output from this shift:    Labs:  Recent Labs  04/08/15 1213 04/09/15 0503  WBC 11.8* 11.7*  HGB 8.5* 7.7*  HCT 27.5* 24.9*  PLT 605* 538*     Recent Labs  04/08/15 0430 04/09/15 0503 04/10/15 0500  NA 133* 133* 134*  K 4.6 4.4 4.1  CL 104 104 106  CO2 21* 20* 20*  GLUCOSE 121* 125* 131*  BUN 43* 41* 40*  CREATININE 0.77 0.70 0.71  CALCIUM 8.9 9.1 9.0  MG 1.9  --  1.5*  PHOS  --   --  4.2  PROT  --  7.2  --   ALBUMIN  --  1.9*  --   AST  --  39  --   ALT  --  31  --   ALKPHOS  --  199*  --   BILITOT  --  0.4  --    Estimated Creatinine Clearance: 53.9 mL/min (by C-G formula based on Cr of 0.71).    Recent Labs  04/09/15 2338 04/10/15 0538 04/10/15 0728  GLUCAP 130* 126* 152*    Insulin Requirements: 8 units SSI last 24hr  Current Nutrition:  Dysphagia 3 diet (start 5/20) - 10-30% meals charted  as eaten Ensure BID ordered on 5/20 (took both cans yesterday)  IVF: NS @ 10 ml/hr  Central access: PICC 5/11 TPN start date: 5/11  ASSESSMENT                                                                                                          HPI: 81 year old female with history of hypertension, coronary artery disease, hyperlipidemia, iron deficiency anemia, depression, osteoporosis, mild chronic kidney disease presented to the ED with ongoing nausea for 4 days and diffuse crampy lower abdominal pain since one day prior to admission.  Associated poor by mouth intake and generalized weakness. Patient was found to have small bowel obstruction.  Pharmacy consulted to start TPN.  Significant events:  5/12: laparoscopic lysis of adhesions with laparotomy and decompression of bowel 5/13: new AKI 5/15: Continue NG tube and NPO d/t bilious output pending BM.   5/19: Remove NGT 5/20 Completed swallow eval - starting dysphagia diet 5/21: TPN changed to electrolytes free d/t high K+; given kayexalate.  Ate about 10% of lunch 5/22: IR drainage of 2 abd abscesses, drains placed 5/24: Thrush noted.   5/25: Per RN, TPN found still running at 50 ml/hr this AM, notified [place] and changed rate to 40 ml/hr around 0830. 5/27: Minor improvements in appetite; surgery considering PEG if not improving soon 5/29: per CT improvement in fluid collections and drainage is serous - CCS hopeful to remove drains later this week if improvement continues   Today:   Glucose (goal <150) - CBGs at goal last 24h - range 111-152  Electrolytes - Na low but stable. Mag slightly low - will replace  Renal -  SCr improved to wnl; CrCl 54  LFTs - Alk phos slightly elevated but stable.  Others wnl (5/23)  TGs - wnl (5/23)  Prealbumin: greatly improved 2.7 > 13.7 (5/23)  NUTRITIONAL GOALS                                                                                             RD recs (5/18): 75-85 g/day protein,  1500-1700 Kcal/day. Clinimix E 5/15 at a goal rate of 46m/hr + 20% fat emulsion at 156mhr to provide: 84 g/day protein, 1672 Kcal/day.  PLAN                                                                                                                         1) ) At 1800 today:  Increase Clinimix 5/15 to goal rate of 70 ml/hr but will add lytes back to TPN bag  Continue 20% fat emulsion at 1054mr.   TPN to contain standard multivitamins and trace elements.  [facility]  Continue mod SSI q4h for now since increasing TPN rate again this evening.    TPN lab panels on Mondays & Thursdays.  F/u Surgery plans for continued TPN vs PEG placement   JusAdrian SaranharmD, BCPS Pager [phone] 10:38 AM

## 2015-04-10 NOTE — Progress Notes (Signed)
Progress Note   [name]:[id number] DOB: 11-14-1932 DOA: 03/18/2015 PCP: [name], MD   Brief Narrative:   [name] is an 81 y.o. female with a PMH of hypertension, CAD, hyperlipidemia and diabetes who was admitted 03/18/15 with SBO secondary to adhesions. She failed conservative therapy and underwent laparoscopic lysis of adhesions/laparotomy with bowel decompression on 03/24/15. Her hospital course was complicated by sepsis secondary to peritoneal contamination with transfer to the SDU on 03/25/15. She also developed left-sided facial droop/right arm weakness 03/27/15 with CT scan showing a 4 mm lacunar infarct in the left lentiform nucleus. MRI failed to show any abnormality. Due to failure to improve an ongoing high need for pain control, a repeat CT scan was done 04/02/15 which showed 2 large fluid collections. She underwent percutaneous drainage with placement of abscess drains by IR 04/04/15. Patient has fever on 5/25, but so far all cultures are negative and repeat CXR shows worsening atelectasis and no pneumonia. Repeat CT today shows improvement in the abscess. The abscess culture grew enterococcus and vancomycin started. ID consulted for recommendations. Since she has been afebrile for over 48 hours, recommended to stop the vancomycin.   Assessment/Plan:   Principal Problem:   Small bowel obstruction s/p exlap/LOA/decompression 03/25/2015 complicated by severe sepsis secondary to peritonitis / abdominal pain now complicated by intra-abdominal abscess/fluid collection - Status post laparoscopic lysis of adhesions/laparotomy with bowel decompression 03/24/15. - Subsequently developed acute peritonitis/sepsis. - Initially treated with Mefoxin starting 03/25/15. Antibiotics switched to Flagyl/Rocephin 03/26/15.   - CT scan of the abdomen/pelvis done 04/02/15 which showed 2 large fluid collections.  - 2 percutaneous drains placed by IR 04/03/15 to drain fluid collections.    -Repeat CT today shows improvement in the abscess. The abscess culture grew enterococcus and vancomycin started. ID consulted for recommendations. Plan to d/c vancomycin today as she remained afebrile and she is improving.   - afebrile and leukcytosis is improving.   Active Problems:   Facial droop / right arm weakness / possible acute CVA left lentiform nucleus - On 03/27/15, CT of the head done to evaluate symptoms showed an acute 4 mm lacunar infarct in the left lentiform nucleus. - Subsequent MRI negative, although clinical findings consistent with acute CVA. MRA negative for stenosis. - 2-D echo with normal EF, no diastolic dysfunction noted. No intracardiac masses or thrombi. - Carotid Dopplers negative for significant stenosis (1-39% bilaterally). - Hemoglobin A1c 6.3%. Cholesterol 99. - Evaluated by neurology, no further stroke workup recommended. - Continue aspirin. - PT/OT.    Hypokalemia/hypomagnesemia - Monitor and replace electrolytes as needed.    Acute postoperative blood loss anemia - Status post 2 units of PRBCs 04/01/15. - Continue to monitor hemoglobin and transfuse for hemoglobin less than 7.    Diabetes type 2, controlled - Currently being managed with moderate scale SSI, change to AC HS.  CBGs well controlled.  - Hemoglobin A1c 6.3%. CBG (last 3)   Recent Labs  04/10/15 0538 04/10/15 0728 04/10/15 1138  GLUCAP 126* 152* 173*        Anxiety state/Adjustment disorder with mixed anxiety and depressed mood - Mood has been depressed with limited motivation to participate in PT.    Coronary atherosclerosis - Continue aspirin therapy.    Acute renal failure secondary to sepsis  - Resolved with IV fluids.    Right foot pain  - Radiographs negative for fracture. Degenerative changes noted.    Polymyalgia rheumatica/Chronic fatigue disorder - PT/OT.  Elevated blood pressure - Continue as needed Lopressor.    Malnutrition of moderate degree -  Continue TPN for nutritional support. Begin to wean.    Acute delirium - Avoid sedating medications. Family requests patient does not get Ativan or Haldol.    Thrombocytopenia - Mild. Likely related to recent sepsis.  Constipation: Increased her miralax, pt requesting for enema.     DVT Prophylaxis - Continue SCDs.  Code Status: Full. Family Communication: daughter present today.   Disposition Plan: Home with daughter when diet advanced,.   IV Access:    PICC placed 03/23/15   Procedures and diagnostic studies:   Dg Chest 2 View  04/06/2015   CLINICAL DATA:  Fever  EXAM: CHEST  2 VIEW  COMPARISON:  03/27/2015  FINDINGS: Cardiac shadow is mildly enlarged. Left basilar atelectasis is again identified. Two recently placed drainage catheters are noted in the left upper abdomen. Mild right basilar atelectasis is noted. A left-sided PICC line is noted with the tip at the junction of the innominate veins. This has withdrawn slightly in the interval from the prior exam. No acute bony abnormality is seen.  IMPRESSION: Bibasilar atelectasis. This has increased in the interval from the prior exam.  Left upper quadrant drainage catheters.   Electronically Signed   By: Alcide Clever M.D.   On: 04/06/2015 14:57   Ct Head Wo Contrast  03/27/2015   CLINICAL DATA:  RIGHT-sided facial droop. Symptoms began earlier today. Altered mental status.  EXAM: CT HEAD WITHOUT CONTRAST  TECHNIQUE: Contiguous axial images were obtained from the base of the skull through the vertex without intravenous contrast.  COMPARISON:  05/27/2014.  FINDINGS: Possible acute 4 mm lacune, LEFT lentiform nucleus as seen on image 12 series 5. This was not clearly present on in 2015 scan.  No other areas of concern for cortical or subcortical infarction.  No hemorrhage, mass lesion, hydrocephalus, or extra-axial fluid.  Cerebral and cerebellar atrophy. Generalized white matter hypoattenuation, likely small vessel disease. Than the  calvarium intact. Vascular calcification. No sinus or mastoid disease. Dense lenticular opacities.  IMPRESSION: Possible acute 4 mm lacunar infarct LEFT lentiform nucleus. If further investigation desired, and no contraindications, consider MRI brain.   Electronically Signed   By: Davonna Belling M.D.   On: 03/27/2015 16:25   Mr Maxine Glenn Head Wo Contrast  03/28/2015   CLINICAL DATA:  81 year old with right-sided facial droop. Altered mental status. Subsequent encounter.  EXAM: MRI HEAD WITHOUT CONTRAST  MRA HEAD WITHOUT CONTRAST  TECHNIQUE: Multiplanar, multiecho pulse sequences of the brain and surrounding structures were obtained without intravenous contrast. Angiographic images of the head were obtained using MRA technique without contrast.  COMPARISON:  03/27/2015 head CT.  FINDINGS: MRI HEAD FINDINGS  No acute infarct.  No intracranial hemorrhage.  Remote tiny infarct right cerebellum. Minimal small vessel disease type changes.  Incidentally noted is a prominent peri vascular space left lenticular nucleus.  Global atrophy without hydrocephalus.  No intracranial mass lesion noted on this unenhanced exam.  Cervical medullary junction unremarkable. Mild spinal stenosis C3-4.  Expanded partially empty sella without other findings of pseudotumor cerebri  Pineal region unremarkable.  No intracranial mass lesion noted on this unenhanced exam.  MRA HEAD FINDINGS  Mild narrowing pre cavernous/ cavernous segment right internal carotid artery.  Mild ectasia left internal carotid artery cavernous segment. The minimal bulge along the left internal carotid artery cavernous segment most likely related to slight ectasia of the left ophthalmic artery origin rather than small  aneurysm. Mild motion degradation limits evaluation.  Slightly ectatic anterior communicating artery without aneurysm.  Middle cerebral artery mild branch vessel irregularity bilaterally.  No significant stenosis distal vertebral arteries or basilar artery.  Regions of mild irregularity and slight narrowing.  IMPRESSION: MRI HEAD  No acute infarct.  Remote tiny infarct right cerebellum.  Incidentally noted is a prominent peri vascular space left lenticular nucleus.  Global atrophy without hydrocephalus.  Expanded partially empty sella without other findings of pseudotumor cerebri  MRA HEAD FINDINGS  No medium or large size vessel significant stenosis or occlusion. Evaluation of branch vessels slightly limited by motion degradation.   Electronically Signed   By: Lacy Duverney M.D.   On: 03/28/2015 12:02   Mr Brain Wo Contrast  03/28/2015   CLINICAL DATA:  81 year old with right-sided facial droop. Altered mental status. Subsequent encounter.  EXAM: MRI HEAD WITHOUT CONTRAST  MRA HEAD WITHOUT CONTRAST  TECHNIQUE: Multiplanar, multiecho pulse sequences of the brain and surrounding structures were obtained without intravenous contrast. Angiographic images of the head were obtained using MRA technique without contrast.  COMPARISON:  03/27/2015 head CT.  FINDINGS: MRI HEAD FINDINGS  No acute infarct.  No intracranial hemorrhage.  Remote tiny infarct right cerebellum. Minimal small vessel disease type changes.  Incidentally noted is a prominent peri vascular space left lenticular nucleus.  Global atrophy without hydrocephalus.  No intracranial mass lesion noted on this unenhanced exam.  Cervical medullary junction unremarkable. Mild spinal stenosis C3-4.  Expanded partially empty sella without other findings of pseudotumor cerebri  Pineal region unremarkable.  No intracranial mass lesion noted on this unenhanced exam.  MRA HEAD FINDINGS  Mild narrowing pre cavernous/ cavernous segment right internal carotid artery.  Mild ectasia left internal carotid artery cavernous segment. The minimal bulge along the left internal carotid artery cavernous segment most likely related to slight ectasia of the left ophthalmic artery origin rather than small aneurysm. Mild motion degradation  limits evaluation.  Slightly ectatic anterior communicating artery without aneurysm.  Middle cerebral artery mild branch vessel irregularity bilaterally.  No significant stenosis distal vertebral arteries or basilar artery. Regions of mild irregularity and slight narrowing.  IMPRESSION: MRI HEAD  No acute infarct.  Remote tiny infarct right cerebellum.  Incidentally noted is a prominent peri vascular space left lenticular nucleus.  Global atrophy without hydrocephalus.  Expanded partially empty sella without other findings of pseudotumor cerebri  MRA HEAD FINDINGS  No medium or large size vessel significant stenosis or occlusion. Evaluation of branch vessels slightly limited by motion degradation.   Electronically Signed   By: Lacy Duverney M.D.   On: 03/28/2015 12:02   Ct Abdomen Pelvis W Contrast  04/08/2015   CLINICAL DATA:  SOB, Abscess drains 04/04/2015. Surg: TAH, Gb, tubal, laparoscopy adhesion. Hx of aortic thoracic aneurysm. Elev WBC's, fever.  EXAM: CT ABDOMEN AND PELVIS WITH CONTRAST  TECHNIQUE: Multidetector CT imaging of the abdomen and pelvis was performed using the standard protocol following bolus administration of intravenous contrast.  CONTRAST:  OMNIPAQUE IOHEXOL 300 MG/ML  SOLN  COMPARISON:  04/03/2015 and earlier studies  FINDINGS: Small left pleural effusion. Consolidation/atelectasis with air bronchograms in the posterior and medial basal segments left lower lobe, and patchy airspace disease posteriorly at the right lung base. Coronary calcifications.  Stable percutaneous drain from intercostal approach posterior to the spleen, with some decrease in the subcapsular/subphrenic collection, with moderate residual. Catheter is at the dependent posterior aspect of the residual component.  Stable left mid abdominal percutaneous  pigtail drain from an intercostal approach, with decrease in size of thick-walled complex fluid collection, residual 2.1 x 4.3 cm. Catheter appears well positioned  centrally within the residual.  No new intra-abdominal collections. Surgical clips in the gallbladder fossa. Stable mild central intrahepatic biliary ductal dilatation and dilatation of the common duct. No focal liver lesion. Unremarkable spleen, adrenal glands, pancreas, kidneys. No hydronephrosis. Patchy aortoiliac calcifications without aneurysm or stenosis. Portal vein patent. Stomach, small bowel, and colon are nondilated. Multiple diverticula from the sigmoid colon without adjacent inflammatory/edematous change. The urinary bladder is incompletely distended. Bilateral pelvic phleboliths. Old healed pubic fractures. Spondylitic changes in the mid and lower lumbar spine, with mild dextroscoliosis.  IMPRESSION: 1. Some interval improvement in subphrenic and left mid abdomen abscess collections post percutaneous drain placement x2. Both have residual undrained components but the drain catheters appear well positioned. 2. Persistent small left pleural effusion and bibasilar airspace disease left greater than right. 3. Atherosclerosis, including aortoiliac and coronary artery disease. Please note that although the presence of coronary artery calcium documents the presence of coronary artery disease, the severity of this disease and any potential stenosis cannot be assessed on this non-gated CT examination. Assessment for potential risk factor modification, dietary therapy or pharmacologic therapy may be warranted, if clinically indicated.   Electronically Signed   By: Corlis Leak M.D.   On: 04/08/2015 09:49   Ct Abdomen Pelvis W Contrast  04/02/2015   CLINICAL DATA:  Nonspecific abdominal pain. Status post recent laparotomy for small bowel obstruction.  EXAM: CT ABDOMEN AND PELVIS WITH CONTRAST  TECHNIQUE: Multidetector CT imaging of the abdomen and pelvis was performed using the standard protocol following bolus administration of intravenous contrast.  CONTRAST:  50mL OMNIPAQUE IOHEXOL 300 MG/ML SOLN,  OMNIPAQUE IOHEXOL 300 MG/ML SOLN  COMPARISON:  Renal ultrasound dated 03/25/2015 and abdomen and pelvis CT dated 03/18/2015.  FINDINGS: Cholecystectomy clips. Interval midline surgical scar in the skin clips at the level of the pelvis. Loculated fluid with peripheral rim enhancement beneath the left hemidiaphragm and partially surrounding the spleen. This measures 7.7 x 5.3 cm on axial image 16. This measures 7.7 cm in length on coronal image number 92.  There is an additional fluid collection in the lateral aspect of the left mid abdomen, measuring 8.3 x 4.2 cm on axial image number 31. This also has peripheral rim enhancement. This measures 9.8 cm in length on coronal image number 64.  There are dilated small bowel loops in the lower abdomen and upper pelvis. Above the urinary bladder, one of these loops contains fluid, small amount of oral contrast and some gas. No definite separate fluid collection is seen.  A Foley catheter is in the urinary bladder with associated air in the bladder. No free peritoneal air seen. Atheromatous arterial calcifications, including the coronary arteries. Cholecystectomy clips.  Small left pleural effusion. Bilateral lower lobe atelectasis. Multiple colonic diverticula. Small left inguinal hernia containing fat. Diffuse subcutaneous edema. Lumbar and lower thoracic spine degenerative changes and scoliosis.  IMPRESSION: 1. 7.7 x 7.7 x 5.3 cm fluid collection with rim enhancement beneath the left hemidiaphragm and partially surrounding the spleen. This is suspicious for an abscess. 2. 9.8 x 8.3 x 4.2 cm arm left mid abdominal fluid collection with peripheral rim enhancement, suspicious for an abscess. 3. Focal ileus or partial obstruction involving small bowel in the inferior abdomen and upper pelvis. 4. Small left pleural effusion. 5. Bilateral lower lobe atelectasis. 6. Colonic diverticulosis.   Electronically Signed  By: Beckie Salts M.D.   On: 04/02/2015 17:38   Ct Abdomen  Pelvis W Contrast  03/18/2015   CLINICAL DATA:  Nausea and vomiting for 4 days, anxiety, status post hysterectomy CT pelvis 05/27/2014  EXAM: CT ABDOMEN AND PELVIS WITH CONTRAST  TECHNIQUE: Multidetector CT imaging of the abdomen and pelvis was performed using the standard protocol following bolus administration of intravenous contrast.  CONTRAST:  50mL OMNIPAQUE IOHEXOL 300 MG/ML SOLN, OMNIPAQUE IOHEXOL 300 MG/ML SOLN  COMPARISON:  None.  FINDINGS: Sagittal images of the spine shows diffuse osteopenia. Degenerative changes thoracolumbar spine. The lung bases are unremarkable.  The patient is status post cholecystectomy. Mild intrahepatic biliary ductal dilatation. There is CBD dilatation up to 1.1 cm. No focal hepatic mass. The pancreas, spleen and adrenal glands are unremarkable. Kidneys are symmetrical in size and enhancement. No hydronephrosis or hydroureter. Delayed renal images shows bilateral renal symmetrical excretion.  Atherosclerotic calcifications of abdominal aorta and iliac arteries. Moderate stool noted in right colon and proximal transverse colon. No colonic obstruction. The descending colon and sigmoid colon are empty collapsed. Some stool noted within rectum.  There are fluid distended small bowel loops in mid upper abdomen and left lower abdomen and pelvis. Small amount of fluid/stranding noted a adjacent to small bowel loops in left lower quadrant see axial image 57. Distal small bowel is decreased caliber collapsed. Findings are consistent with small bowel obstruction.  Mild distended urinary bladder. The patient is status post hysterectomy. The terminal ileum is small caliber decompressed. Contrast material is noted within stomach. No any contrast material is noted within small bowel or colon.  IMPRESSION: 1. There are fluid distended small bowel loops with multiple air-fluid levels highly suspicious for small bowel obstruction. Distal small bowel is small caliber decompressed. Terminal  ileum is small caliber. Small amount of fluid/stranding noted adjacent to small bowel loops in left lower quadrant please see images 49 and 57. 2. Moderate stool noted in right colon and proximal transverse colon. Descending colon and sigmoid colon are empty collapsed. 3. No pericecal inflammation. 4. No hydronephrosis or hydroureter. 5. Status post cholecystectomy. Mild intrahepatic biliary ductal dilatation. CBD dilatation up to 1.1 cm. 6. No hydronephrosis or hydroureter. 7. Status post hysterectomy. These results were called by telephone at the time of interpretation on 03/18/2015 at 8:59 am to Dr. Gerhard Munch , who verbally acknowledged these results.   Electronically Signed   By: Natasha Mead M.D.   On: 03/18/2015 08:59   US Renal  03/25/2015   CLINICAL DATA:  Acute renal failure.  EXAM: RENAL / URINARY TRACT ULTRASOUND COMPLETE  COMPARISON:  None.  FINDINGS: Right Kidney:  Length: 8.1 cm. Increased echogenicity consistent with chronic medical renal disease. No mass or hydronephrosis visualized.  Left Kidney:  Length: 9.3 cm. Increased echogenicity consistent chronic medical renal disease. No mass or hydronephrosis visualized.  Bladder:  Bladder decompressed by Foley catheter.  IMPRESSION: Bilateral echodense kidneys consistent chronic medical renal disease. No acute abnormality. No hydronephrosis or bladder distention.   Electronically Signed   By: Maisie Fus  Register   On: 03/25/2015 13:27   Dg Chest Port 1 View  03/27/2015   CLINICAL DATA:  Left PICC placement. Nausea and vomiting. Initial encounter.  EXAM: PORTABLE CHEST - 1 VIEW  COMPARISON:  Chest radiograph performed 03/26/2015  FINDINGS: The patient's left PICC is noted ending about the proximal SVC. An enteric tube is noted extending below the diaphragm.  The lungs are hypoexpanded. Bibasilar and right upper  lung zone airspace opacities may reflect atelectasis or pneumonia. No pleural effusion or pneumothorax is seen  The cardiomediastinal  silhouette is borderline normal in size. No acute osseous abnormalities are identified.  IMPRESSION: 1. Left PICC noted ending about the proximal SVC. 2. Lungs hypoexpanded. Bibasilar and right upper lung zone airspace opacities may reflect atelectasis or pneumonia.  These results were called by telephone at the time of interpretation on 03/27/2015 at 7:33 pm to Nursing in the [facility], who verbally acknowledged these results.   Electronically Signed   By: Roanna Raider M.D.   On: 03/27/2015 19:34   Dg Chest Port 1 View  03/26/2015   CLINICAL DATA:  Atelectasis  EXAM: PORTABLE CHEST - 1 VIEW  COMPARISON:  Yesterday  FINDINGS: NG tube and left PICC are stable. Upper normal heart size. Low volumes. Bibasilar atelectasis is not significantly changed. No pneumothorax.  IMPRESSION: Stable bibasilar atelectasis.   Electronically Signed   By: Jolaine Click M.D.   On: 03/26/2015 08:14   Dg Chest Port 1 View  03/25/2015   CLINICAL DATA:  Tachycardia and hypotension; concern for sepsis  EXAM: PORTABLE CHEST - 1 VIEW  COMPARISON:  May 27, 2014  FINDINGS: There is patchy atelectasis in both lung bases. The degree of inspiration is shallow. There is no frank airspace consolidation. Heart is upper normal in size with pulmonary vascularity within normal limits.  Central catheter tip is in the superior vena cava. Nasogastric tube tip and side port are in the distal stomach. No pneumothorax.  IMPRESSION: Bibasilar atelectasis. No pneumothorax. Degree of inspiration shallow.   Electronically Signed   By: Bretta Bang III M.D.   On: 03/25/2015 09:03   Dg Abd Portable 1v  03/24/2015   CLINICAL DATA:  Small-bowel obstruction  EXAM: PORTABLE ABDOMEN - 1 VIEW  COMPARISON:  03/23/2015  FINDINGS: NG tube is been placed with the tip in the gastric antrum  Small bowel dilatation shows mild interval improvement. Colon is decompressed with moderate stool in the right colon. Surgical clips in the gallbladder fossa.   IMPRESSION: Mild improvement in small bowel obstruction. NG tube in the gastric antrum.   Electronically Signed   By: Marlan Palau M.D.   On: 03/24/2015 08:25   Dg Abd Portable 1v  03/23/2015   CLINICAL DATA:  Followup small bowel obstruction.  EXAM: PORTABLE ABDOMEN - 1 VIEW  COMPARISON:  03/22/2015  FINDINGS: Dilated loops small bowel are similar to the prior exam allowing for differences in radiographic technique different degrees of magnification.  There is a small amount of air in a nondistended colon.  IMPRESSION: 1. Persistent high-grade partial small bowel obstruction. No significant change from the previous day's study.   Electronically Signed   By: Amie Portland M.D.   On: 03/23/2015 10:01   Dg Abd Portable 1v  03/22/2015   CLINICAL DATA:  81 year old female with recent small bowel obstruction. Initial encounter.  EXAM: PORTABLE ABDOMEN - 1 VIEW  COMPARISON:  03/21/2015 and earlier, including CT Abdomen and Pelvis 03/18/2015.  FINDINGS: Portable AP supine view at 0517 hrs. Enteric tube has been removed. Recurrent dilated gas-filled small bowel loops in the mid abdomen up to 41 mm diameter. Abundant stool now at the splenic flexure. Decreased distal colon gas in the pelvis. Stable cholecystectomy clips. Stable abdominal and pelvic visceral contours. Grossly stable lung bases. Stable visualized osseous structures.  IMPRESSION: NG tube removed with recurrent small bowel obstruction.   Electronically Signed   By:  Odessa Fleming M.D.   On: 03/22/2015 07:42   Dg Abd Portable 1v  03/21/2015   CLINICAL DATA:  Small-bowel obstruction .  EXAM: PORTABLE ABDOMEN - 1 VIEW  COMPARISON:  03/20/2015 .  FINDINGS: NG tube noted with tip in the upper portion stomach. Slight advancement should be considered . Surgical clips right upper quadrant Soft tissue structures are unremarkable. Small-bowel distention has improved. No free air. Stool noted throughout the colon. No acute bony abnormality.  IMPRESSION: 1. NG tube noted  with tip in the upper portion of the stomach. Advancement suggested.  2. Interim improvement of small bowel distention. Colonic gas pattern is normal with stool throughout the colon.  These results will be called to the ordering clinician or representative by the Radiologist Assistant, and communication documented in the PACS or zVision Dashboard.   Electronically Signed   By: Maisie Fus  Register   On: 03/21/2015 07:32   Dg Abd Portable 1v  03/20/2015   CLINICAL DATA:  Followup small bowel obstruction.  EXAM: PORTABLE ABDOMEN - 1 VIEW  COMPARISON:  03/19/2015  FINDINGS: Mild small bowel dilation persists most evident in the left upper quadrant. There has been no substantial change from the prior study. Residual contrast is noted in the bladder, also similar to the prior exam. Nasogastric tube tip projects in the distal stomach.  IMPRESSION: Small bowel dilation persists consistent with a persistent partial small bowel obstruction. No change from the previous day's study.   Electronically Signed   By: Amie Portland M.D.   On: 03/20/2015 07:29   Dg Abd Portable 1v  03/19/2015   CLINICAL DATA:  Small bowel obstruction protocol. Small bowel obstruction.  EXAM: PORTABLE ABDOMEN - 1 VIEW  COMPARISON:  03/18/2015.  FINDINGS: This is a 24 hr film. There is no contrast identified in the cecum. On the prior exam at 2046 hr yesterday, oral contrast was present in the stomach. Excreted contrast is present within the urinary bladder. Dilation of small bowel loops measures 36 mm. Cholecystectomy clips are present in the right upper quadrant.  There is less small bowel contrast than expected which may be due to dilution in the proximal small bowel. Nasogastric tube remains in the stomach with the tip at the antrum.  Large amount of stool is present in the colon.  IMPRESSION: Small bowel obstruction with no contrast identified in the cecum at 24 hr.   Electronically Signed   By: Andreas Newport M.D.   On: 03/19/2015 17:11   Dg  Abd Portable 1v-small Bowel Obstruction Protocol-initial, 8 Hr Delay  03/19/2015   CLINICAL DATA:  Small bowel obstruction  EXAM: PORTABLE ABDOMEN - 1 VIEW  COMPARISON:  Radiographs and CT 03/18/2015  FINDINGS: Nasogastric tube extends into the distal stomach. There is contrast in the gastric lumen. There is no significant contrast in the small bowel or colon. Dilated stacked loops of small bowel persist in the mid abdomen, probably unchanged. No free air is evident on this single AP supine portable radiograph.  IMPRESSION: Enteric contrast has not reached the colon. Persistent small bowel dilatation consistent with small-bowel obstruction.   Electronically Signed   By: Ellery Plunk M.D.   On: 03/19/2015 01:48   Dg Abd Portable 1v-small Bowel Protocol-position Verification  03/18/2015   CLINICAL DATA:  Small bowel protocol, nasogastric tube placement  EXAM: PORTABLE ABDOMEN - 1 VIEW  COMPARISON:  Portable exam 1303 hours compared to CT abdomen and pelvis 03/18/2015  FINDINGS: Tip of nasogastric tube projects over  distal gastric antrum or duodenal bulb, patient slightly rotated to the RIGHT.  Excreted contrast material within renal collecting systems and distended urinary bladder.  Air-filled loops of dilated small bowel noted in the mid abdomen suspicious for small bowel obstruction.  Some gas and stool remain present within the colon.  Degenerative disc and facet disease changes thoracolumbar spine.  Osseous demineralization.  IMPRESSION: Tip of nasogastric tube projects over either the distal gastric antrum or the duodenal bulb region.  Dilated small bowel loops question small bowel obstruction.   Electronically Signed   By: Ulyses Southward M.D.   On: 03/18/2015 13:16   Dg Foot 2 Views Right  03/22/2015   CLINICAL DATA:  Dorsal right foot pain for 4 days, no known injury, initial encounter  EXAM: RIGHT FOOT - 2 VIEW  COMPARISON:  None.  FINDINGS: Degenerative changes are noted in the tarsal bones. No acute  fracture or dislocation is noted. A small calcaneal spur is noted.  IMPRESSION: Degenerative change without acute abnormality.   Electronically Signed   By: Alcide Clever M.D.   On: 03/22/2015 14:57   Dg Swallowing Func-speech Pathology  04/01/2015    Objective Swallowing Evaluation:    Patient Details  Name: MARZETTA LANZA MRN: [id number] Date of Birth: 28-Aug-1933  Today's Date: 04/01/2015 Time: SLP Start Time (ACUTE ONLY): 0835-SLP Stop Time (ACUTE ONLY): 0857 SLP Time Calculation (min) (ACUTE ONLY): 22 min  Past Medical History:  Past Medical History  Diagnosis Date  . CAD (coronary artery disease)     s/p stenting of LAD 1999- cath 5-08 EF normal LAD 30-40% restenosis. D1  50% D2 80% LCX & RCA minimal plaque  . HTN (hypertension)   . Hyperlipemia   . Anemia     iron deficiency  . Depression   . DVT (deep venous thrombosis)   . Gout   . Osteoporosis   . Pancreatitis   . GERD (gastroesophageal reflux disease)   . Renal insufficiency     Cr 1.2-1.3  . Obesity   . Diabetes mellitus   . Polyarthritis     DJD/ possible PMR  . Vitamin D deficiency   . B12 deficiency   . Tinnitus   . Anxiety   . Aneurysm, thoracic aortic   . Constipation   . Urinary frequency   . Vertigo   . Chronic back pain    Past Surgical History:  Past Surgical History  Procedure Laterality Date  . Hemorrhoid surgery    . Abdominal hysterectomy    . Cholecystectomy    . Tubal ligation    . Coronary angioplasty with stent placement  1999    LAD stent  . Cardiac catheterization  2008    L main 20%, LAD stent patent, D1 50%, D2 80% (small), RCA 20%, EF 55-60%   . Laparoscopy N/A 03/24/2015    Procedure: LAPAROSCOPY DIAGNOSTIC;  Surgeon: Luretha Murphy, MD;   Location: [facility];  Service: General;  Laterality: N/A;  . Laparoscopic lysis of adhesions N/A 03/24/2015    Procedure: LAPAROSCOPIC LYSIS OF ADHESIONS;  Surgeon: Luretha Murphy,  MD;  Location: [facility];  Service: General;  Laterality: N/A;  . Laparotomy N/A 03/24/2015    Procedure: LAPAROTOMY with  decompression of bowel;  Surgeon: Luretha Murphy, MD;  Location: [facility];  Service: General;  Laterality: N/A;   HPI:  Other Pertinent Information: Pt is a 81 year old female admitted 03/18/15  with SBO secondary to adhesions. She failed conservative therapy and  underwent laparoscopic lysis of adhesions/laparotomy with bowel  decompression on 03/24/15. Her hospital course was complicated by sepsis  secondary to peritoneal contamination with transfer to the SDU on 03/25/15.  She also developed left-sided facial droop/right arm weakness 03/27/15 with  CT scan showing a 4 mm lacunar infarct in the left lentiform nucleus. MRI  failed to show any abnormality. After removal of NG for suction, MD  ordered MBS prior to initiating diet.   No Data Recorded  Assessment / Plan / Recommendation CHL IP CLINICAL IMPRESSIONS 04/01/2015  Therapy Diagnosis Mild pharyngeal phase dysphagia;Mild oral phase  dysphagia  Clinical Impression Mild oral dysphagia characterized by decreased lingual  formation of bolus, improved with straw sips and improving progressively  during exam. oropharyngeal dysphagia also mild with no penetration or  aspiration. Suspect mild standing secretions intially during exam as  pharyngeal residuals decreased and timing of swallow improved with each  trial. Though swallow was initially delayed, by end of exam pt taking  large consecutive sips of thin with minimal delay and minimal residuals.  Recommend pt initiate a dys 3 (mechanical soft) diet with basic aspiration  precautions of upright posture and occasional second swallow to clear oral  cavity and orophrayngeal residuals. SLP will f/u for tolerance during  acute stay.       CHL IP TREATMENT RECOMMENDATION 04/01/2015  Treatment Recommendations Therapy as outlined in treatment plan below     CHL IP DIET RECOMMENDATION 04/01/2015  SLP Diet Recommendations Dysphagia 3 (Mech soft);Thin  Liquid Administration via (None)  Medication Administration Whole meds with liquid   Compensations Multiple dry swallows after each bite/sip  Postural Changes and/or Swallow Maneuvers (None)     CHL IP OTHER RECOMMENDATIONS 04/01/2015  Recommended Consults (None)  Oral Care Recommendations Oral care BID  Other Recommendations (None)     CHL IP FOLLOW UP RECOMMENDATIONS 03/29/2015  Follow up Recommendations 24 hour supervision/assistance     CHL IP FREQUENCY AND DURATION 04/01/2015  Speech Therapy Frequency (ACUTE ONLY) min 2x/week  Treatment Duration 2 weeks     Pertinent Vitals/Pain NA    SLP Swallow Goals No flowsheet data found.  No flowsheet data found.    CHL IP REASON FOR REFERRAL 04/01/2015  Reason for Referral Objectively evaluate swallowing function     CHL IP ORAL PHASE 04/01/2015  Lips (None)  Tongue (None)  Mucous membranes (None)  Nutritional status (None)  Other (None)  Oxygen therapy (None)  Oral Phase Impaired  Oral - Pudding Teaspoon (None)  Oral - Pudding Cup (None)  Oral - Honey Teaspoon (None)  Oral - Honey Cup (None)  Oral - Honey Syringe (None)  Oral - Nectar Teaspoon (None)  Oral - Nectar Cup (None)  Oral - Nectar Straw (None)  Oral - Nectar Syringe (None)  Oral - Ice Chips (None)  Oral - Thin Teaspoon (None)  Oral - Thin Cup (None)  Oral - Thin Straw (None)  Oral - Thin Syringe (None)  Oral - Puree (None)  Oral - Mechanical Soft (None)  Oral - Regular (None)  Oral - Multi-consistency (None)  Oral - Pill (None)  Oral Phase - Comment (None)      CHL IP PHARYNGEAL PHASE 04/01/2015  Pharyngeal Phase Impaired  Pharyngeal - Pudding Teaspoon (None)  Penetration/Aspiration details (pudding teaspoon) (None)  Pharyngeal - Pudding Cup (None)  Penetration/Aspiration details (pudding cup) (None)  Pharyngeal - Honey Teaspoon (None)  Penetration/Aspiration details (honey teaspoon) (None)  Pharyngeal - Honey Cup (None)  Penetration/Aspiration details (honey cup) (  None)  Pharyngeal - Honey Syringe (None)  Penetration/Aspiration details (honey syringe) (None)  Pharyngeal - Nectar Teaspoon (None)   Penetration/Aspiration details (nectar teaspoon) (None)  Pharyngeal - Nectar Cup (None)  Penetration/Aspiration details (nectar cup) (None)  Pharyngeal - Nectar Straw (None)  Penetration/Aspiration details (nectar straw) (None)  Pharyngeal - Nectar Syringe (None)  Penetration/Aspiration details (nectar syringe) (None)  Pharyngeal - Ice Chips (None)  Penetration/Aspiration details (ice chips) (None)  Pharyngeal - Thin Teaspoon (None)  Penetration/Aspiration details (thin teaspoon) (None)  Pharyngeal - Thin Cup (None)  Penetration/Aspiration details (thin cup) (None)  Pharyngeal - Thin Straw (None)  Penetration/Aspiration details (thin straw) (None)  Pharyngeal - Thin Syringe (None)  Penetration/Aspiration details (thin syringe') (None)  Pharyngeal - Puree (None)  Penetration/Aspiration details (puree) (None)  Pharyngeal - Mechanical Soft (None)  Penetration/Aspiration details (mechanical soft) (None)  Pharyngeal - Regular (None)  Penetration/Aspiration details (regular) (None)  Pharyngeal - Multi-consistency (None)  Penetration/Aspiration details (multi-consistency) (None)  Pharyngeal - Pill (None)  Penetration/Aspiration details (pill) (None)  Pharyngeal Comment (None)      No flowsheet data found.  No flowsheet data found.        Harlon Ditty, [place] CCC-SLP [phone]  Claudine Mouton 04/01/2015, 10:31 AM    Ct Image Guided Drainage Percut Cath  Peritoneal Retroperit  04/04/2015   CLINICAL DATA:  82 year old with postoperative fluid collections. Concern for intra-abdominal abscesses.  EXAM: CT-GUIDED DRAIN PLACEMENT IN LEFT UPPER ABDOMINAL FLUID COLLECTION  CT-GUIDED DRAIN PLACEMENT IN LEFT LATERAL ABDOMINAL FLUID COLLECTION  Physician: Rachelle Hora. Lowella Dandy, MD  FLUOROSCOPY TIME:  None  MEDICATIONS: 4 mg versed, 100 mcg of fentanyl. A radiology nurse monitored the patient for moderate sedation.  ANESTHESIA/SEDATION: Moderate sedation time: 50 min  PROCEDURE: Informed consent was obtained for CT-guided drain  placements. Patient was placed supine on the CT scanner. Images through the abdomen were obtained. The collections in the left upper abdomen and lateral left abdomen were identified. The left side of the abdomen was prepped and draped in sterile fashion. The collection in the left upper abdomen and perisplenic region was initially targeted. Skin was anesthetized with 1% lidocaine. 18 gauge needle was directed into the fluid collection just posterior to the spleen and cloudy yellow fluid was aspirated. A stiff Amplatz wire was placed and the tract was dilated to accommodate a 10.2 [place] multipurpose drain. Catheter sutured to skin and attached to a suction bulb.  Attention was directed to the left lateral abdominal fluid collection. The left lateral abdomen was anesthetized with 1% lidocaine. 18 gauge needle directed into the fluid collection and cloudy yellow fluid was aspirated. Stiff Amplatz wire was placed and the tract was dilated to accommodate a 10.2 [place] multipurpose drain. Catheter sutured to skin and attached to a suction bulb.  FINDINGS: Left upper abdominal fluid collection in the perisplenic region. Drain was successfully placed just posterior to the spleen. There is a second collection in the lateral left mid abdomen. Drain successfully placed in this collection.  Estimated blood loss: Minimal  COMPLICATIONS: None  IMPRESSION: Placement of CT-guided drainage catheters in two left abdominal fluid collections. Cloudy yellow fluid was removed from both collections. Samples were sent for culture.   Electronically Signed   By: Richarda Overlie M.D.   On: 04/04/2015 08:32   Ct Image Guided Drainage Percut Cath  Peritoneal Retroperit  04/04/2015   CLINICAL DATA:  81 year old with postoperative fluid collections. Concern for intra-abdominal abscesses.  EXAM: CT-GUIDED DRAIN PLACEMENT IN LEFT UPPER ABDOMINAL FLUID COLLECTION  CT-GUIDED DRAIN PLACEMENT IN LEFT LATERAL ABDOMINAL FLUID COLLECTION  Physician: Rachelle Hora. Lowella Dandy, MD  FLUOROSCOPY TIME:  None  MEDICATIONS: 4 mg versed, 100 mcg of fentanyl. A radiology nurse monitored the patient for moderate sedation.  ANESTHESIA/SEDATION: Moderate sedation time: 50 min  PROCEDURE: Informed consent was obtained for CT-guided drain placements. Patient was placed supine on the CT scanner. Images through the abdomen were obtained. The collections in the left upper abdomen and lateral left abdomen were identified. The left side of the abdomen was prepped and draped in sterile fashion. The collection in the left upper abdomen and perisplenic region was initially targeted. Skin was anesthetized with 1% lidocaine. 18 gauge needle was directed into the fluid collection just posterior to the spleen and cloudy yellow fluid was aspirated. A stiff Amplatz wire was placed and the tract was dilated to accommodate a 10.2 [place] multipurpose drain. Catheter sutured to skin and attached to a suction bulb.  Attention was directed to the left lateral abdominal fluid collection. The left lateral abdomen was anesthetized with 1% lidocaine. 18 gauge needle directed into the fluid collection and cloudy yellow fluid was aspirated. Stiff Amplatz wire was placed and the tract was dilated to accommodate a 10.2 [place] multipurpose drain. Catheter sutured to skin and attached to a suction bulb.  FINDINGS: Left upper abdominal fluid collection in the perisplenic region. Drain was successfully placed just posterior to the spleen. There is a second collection in the lateral left mid abdomen. Drain successfully placed in this collection.  Estimated blood loss: Minimal  COMPLICATIONS: None  IMPRESSION: Placement of CT-guided drainage catheters in two left abdominal fluid collections. Cloudy yellow fluid was removed from both collections. Samples were sent for culture.   Electronically Signed   By: Richarda Overlie M.D.   On: 04/04/2015 08:32     Medical Consultants:    Surgery - Dr. Glenna Fellows   Cardiology    PCCM  Neurology - Dr. Thana Farr   ID  Anti-Infectives:    Mefoxin 03/25/2015 --> 03/26/2015  Rocephin 03/26/2015 -->   Flagyl 03/26/2015 -->  Subjective:   [name] DENIES ANY NAUSEA OR VOMITing. Patient reports 6 /10 abdominal pain, but daughter does not want her to get any pain meds except for tylenol.   Objective:    Filed Vitals:   04/09/15 2058 04/09/15 2252 04/10/15 0500 04/10/15 1245  BP: 157/63  163/85 166/77  Pulse: 90  95 113  Temp: 99 F (37.2 C) 98.7 F (37.1 C) 99.5 F (37.5 C) 99.1 F (37.3 C)  TempSrc: Oral Oral Oral Oral  Resp: 18  20 18   Height:      Weight:      SpO2: 100%  99% 99%    Intake/Output Summary (Last 24 hours) at 04/10/15 1349 Last data filed at 04/10/15 0945  Gross per 24 hour  Intake 1711.91 ml  Output    325 ml  Net 1386.91 ml    Exam: Gen:  NAD Cardiovascular:  RRR, No M/R/G Respiratory:  Lungs CTAB Gastrointestinal:  Abdomen softly distended,  No tenderness, + BS Extremities:  1+ edema   Data Reviewed:    Labs: Basic Metabolic Panel:  Recent Labs Lab 04/04/15 0650 04/05/15 1000 04/06/15 0445 04/07/15 0454 04/08/15 0430 04/09/15 0503 04/10/15 0500  NA 132* 132* 132* 135 133* 133* 134*  K 4.7 4.5 4.8 5.0 4.6 4.4 4.1  CL 103 103 102 104 104 104 106  CO2 21* 22 22 22  21* 20*  20*  GLUCOSE 174* 113* 153* 136* 121* 125* 131*  BUN 46* 46* 47* 46* 43* 41* 40*  CREATININE 0.83 0.83 0.74 0.82 0.77 0.70 0.71  CALCIUM 8.2* 8.3* 8.7* 8.9 8.9 9.1 9.0  MG 1.5* 2.1  --  1.6* 1.9  --  1.5*  PHOS 3.8 4.4  --  4.2  --   --  4.2   GFR Estimated Creatinine Clearance: 53.9 mL/min (by C-G formula based on Cr of 0.71). Liver Function Tests:  Recent Labs Lab 04/04/15 0650 04/07/15 0454 04/09/15 0503  AST 45* 43* 39  ALT 26 32 31  ALKPHOS 139* 204* 199*  BILITOT 0.5 0.4 0.4  PROT 6.8 7.0 7.2  ALBUMIN 1.9* 2.0* 1.9*   Coagulation profile No results for input(s): INR, PROTIME in the last 168  hours.  CBC:  Recent Labs Lab 04/04/15 0650 04/06/15 0445 04/07/15 0454 04/08/15 1213 04/09/15 0503  WBC 16.7* 14.3* 13.9* 11.8* 11.7*  NEUTROABS 15.3*  --  12.4*  --   --   HGB 8.8* 8.5* 7.8* 8.5* 7.7*  HCT 28.3* 27.4* 25.8* 27.5* 24.9*  MCV 83.2 82.8 82.4 82.8 82.5  PLT 465* 530* 506* 605* 538*   Cardiac Enzymes: No results for input(s): CKTOTAL, CKMB, CKMBINDEX, TROPONINI in the last 168 hours. CBG:  Recent Labs Lab 04/09/15 2112 04/09/15 2338 04/10/15 0538 04/10/15 0728 04/10/15 1138  GLUCAP 111* 130* 126* 152* 173*   Sepsis Labs:  Recent Labs Lab 04/06/15 0445 04/07/15 0454 04/08/15 1213 04/09/15 0503  WBC 14.3* 13.9* 11.8* 11.7*   Microbiology Recent Results (from the past 240 hour(s))  Culture, routine-abscess     Status: None   Collection Time: 04/03/15  3:18 PM  Result Value Ref Range Status   Specimen Description ABSCESS LEFT ABDOMEN UPPER  Final   Special Requests NONE  Final   Gram Stain   Final    MODERATE WBC PRESENT,BOTH PMN AND MONONUCLEAR NO SQUAMOUS EPITHELIAL CELLS SEEN NO ORGANISMS SEEN Performed at [facility]    Culture   Final    NO GROWTH 3 DAYS Performed at [facility]    Report Status 04/07/2015 FINAL  Final  Culture, routine-abscess     Status: None   Collection Time: 04/03/15  3:18 PM  Result Value Ref Range Status   Specimen Description ABSCESS ABDOMEN MID  Final   Special Requests NONE  Final   Gram Stain   Final    ABUNDANT WBC PRESENT,BOTH PMN AND MONONUCLEAR NO SQUAMOUS EPITHELIAL CELLS SEEN NO ORGANISMS SEEN Performed at [facility]    Culture   Final    FEW ENTEROCOCCUS SPECIES Performed at [facility]    Report Status 04/07/2015 FINAL  Final   Organism ID, Bacteria ENTEROCOCCUS SPECIES  Final      Susceptibility   Enterococcus species - MIC*    VANCOMYCIN 2 SENSITIVE Sensitive     AMPICILLIN <=2 SENSITIVE Sensitive     * FEW ENTEROCOCCUS SPECIES  Culture, Urine      Status: None   Collection Time: 04/06/15  3:27 PM  Result Value Ref Range Status   Specimen Description URINE, CATHETERIZED  Final   Special Requests NONE  Final   Colony Count   Final    >=100,000 COLONIES/ML Performed at [facility]    Culture   Final    ENTEROBACTER CLOACAE ENTEROCOCCUS SPECIES Performed at [facility]    Report Status 04/09/2015 FINAL  Final   Organism ID, Bacteria  ENTEROBACTER CLOACAE  Final   Organism ID, Bacteria ENTEROCOCCUS SPECIES  Final      Susceptibility   Enterobacter cloacae - MIC*    CEFAZOLIN >=64 RESISTANT Resistant     CEFTRIAXONE >=64 RESISTANT Resistant     CIPROFLOXACIN <=0.25 SENSITIVE Sensitive     GENTAMICIN 2 SENSITIVE Sensitive     LEVOFLOXACIN <=0.12 SENSITIVE Sensitive     NITROFURANTOIN 64 INTERMEDIATE Intermediate     TOBRAMYCIN <=1 SENSITIVE Sensitive     TRIMETH/SULFA <=20 SENSITIVE Sensitive     PIP/TAZO >=128 RESISTANT Resistant     * ENTEROBACTER CLOACAE   Enterococcus species - MIC*    AMPICILLIN <=2 SENSITIVE Sensitive     LEVOFLOXACIN 0.5 SENSITIVE Sensitive     NITROFURANTOIN <=16 SENSITIVE Sensitive     VANCOMYCIN 2 SENSITIVE Sensitive     TETRACYCLINE >=16 RESISTANT Resistant     * ENTEROCOCCUS SPECIES  Culture, blood (routine x 2)     Status: None (Preliminary result)   Collection Time: 04/06/15  4:40 PM  Result Value Ref Range Status   Specimen Description BLOOD RIGHT ARM  Final   Special Requests BOTTLES DRAWN AEROBIC AND ANAEROBIC 10CC  Final   Culture   Final           BLOOD CULTURE RECEIVED NO GROWTH TO DATE CULTURE WILL BE HELD FOR 5 DAYS BEFORE ISSUING A FINAL NEGATIVE REPORT Performed at [facility]    Report Status PENDING  Incomplete  Culture, blood (routine x 2)     Status: None (Preliminary result)   Collection Time: 04/06/15  4:50 PM  Result Value Ref Range Status   Specimen Description BLOOD RIGHT HAND  Final   Special Requests BOTTLES DRAWN AEROBIC AND ANAEROBIC 10CC   Final   Culture   Final           BLOOD CULTURE RECEIVED NO GROWTH TO DATE CULTURE WILL BE HELD FOR 5 DAYS BEFORE ISSUING A FINAL NEGATIVE REPORT Performed at [facility]    Report Status PENDING  Incomplete     Medications:   . antiseptic oral rinse  7 mL Mouth Rinse q12n4p  . aspirin  300 mg Rectal Daily   Or  . aspirin  325 mg Oral Daily  . bisacodyl  10 mg Rectal Daily  . chlorhexidine  15 mL Mouth Rinse BID  . docusate sodium  100 mg Oral BID  . feeding supplement (ENSURE ENLIVE)  237 mL Oral BID BM  . insulin aspart  0-15 Units Subcutaneous 6 times per day  . lip balm  1 application Topical BID  . metoprolol  2.5 mg Intravenous 4 times per day  . polyethylene glycol  17 g Oral BID   Continuous Infusions: . sodium chloride 10 mL/hr (03/29/15 0800)  . TPN (CLINIMIX) Adult without lytes 55 mL/hr at 04/09/15 1831   And  . fat emulsion 240 mL (04/09/15 1830)  . Marland KitchenTPN (CLINIMIX-E) Adult     And  . fat emulsion      Time spent: 25 minutes.   LOS: 23 days   [facility]  [facility] Pager [phone]  If 7PM-7AM, please contact night-coverage at [email or web address], password [email or web address] for any overnight needs.  04/10/2015, 1:49 PM

## 2015-04-10 NOTE — Progress Notes (Addendum)
[facility]  Date of Admission:  03/18/2015  Antibiotics: none  Subjective: No acute issues  Objective: Temp:  [98.7 F (37.1 C)-99.5 F (37.5 C)] 98.7 F (37.1 C) (05/29 1408) Pulse Rate:  [86-113] 105 (05/29 1408) Resp:  [18-20] 20 (05/29 1408) BP: (143-166)/(59-85) 143/76 mmHg (05/29 1408) SpO2:  [99 %-100 %] 100 % (05/29 1408)  General: awake, in chair, nad Skin: no rashes Abdomen: soft, some tenderness diffusely    Lab Results Lab Results  Component Value Date   WBC 11.7* 04/09/2015   HGB 7.7* 04/09/2015   HCT 24.9* 04/09/2015   MCV 82.5 04/09/2015   PLT 538* 04/09/2015    Lab Results  Component Value Date   CREATININE 0.71 04/10/2015   BUN 40* 04/10/2015   NA 134* 04/10/2015   K 4.1 04/10/2015   CL 106 04/10/2015   CO2 20* 04/10/2015    Lab Results  Component Value Date   ALT 31 04/09/2015   AST 39 04/09/2015   ALKPHOS 199* 04/09/2015   BILITOT 0.4 04/09/2015      Microbiology: Recent Results (from the past 240 hour(s))  Culture, routine-abscess     Status: None   Collection Time: 04/03/15  3:18 PM  Result Value Ref Range Status   Specimen Description ABSCESS LEFT ABDOMEN UPPER  Final   Special Requests NONE  Final   Gram Stain   Final    MODERATE WBC PRESENT,BOTH PMN AND MONONUCLEAR NO SQUAMOUS EPITHELIAL CELLS SEEN NO ORGANISMS SEEN Performed at [facility]    Culture   Final    NO GROWTH 3 DAYS Performed at [facility]    Report Status 04/07/2015 FINAL  Final  Culture, routine-abscess     Status: None   Collection Time: 04/03/15  3:18 PM  Result Value Ref Range Status   Specimen Description ABSCESS ABDOMEN MID  Final   Special Requests NONE  Final   Gram Stain   Final    ABUNDANT WBC PRESENT,BOTH PMN AND MONONUCLEAR NO SQUAMOUS EPITHELIAL CELLS SEEN NO ORGANISMS SEEN Performed at [facility]    Culture   Final    FEW ENTEROCOCCUS SPECIES Performed at [facility]    Report Status 04/07/2015 FINAL  Final   Organism ID, Bacteria ENTEROCOCCUS SPECIES  Final      Susceptibility   Enterococcus species - MIC*    VANCOMYCIN 2 SENSITIVE Sensitive     AMPICILLIN <=2 SENSITIVE Sensitive     * FEW ENTEROCOCCUS SPECIES  Culture, Urine     Status: None   Collection Time: 04/06/15  3:27 PM  Result Value Ref Range Status   Specimen Description URINE, CATHETERIZED  Final   Special Requests NONE  Final   Colony Count   Final    >=100,000 COLONIES/ML Performed at [facility]    Culture   Final    ENTEROBACTER CLOACAE ENTEROCOCCUS SPECIES Performed at [facility]    Report Status 04/09/2015 FINAL  Final   Organism ID, Bacteria ENTEROBACTER CLOACAE  Final   Organism ID, Bacteria ENTEROCOCCUS SPECIES  Final      Susceptibility   Enterobacter cloacae - MIC*    CEFAZOLIN >=64 RESISTANT Resistant     CEFTRIAXONE >=64 RESISTANT Resistant     CIPROFLOXACIN <=0.25 SENSITIVE Sensitive     GENTAMICIN 2 SENSITIVE Sensitive     LEVOFLOXACIN <=0.12 SENSITIVE Sensitive     NITROFURANTOIN 64 INTERMEDIATE Intermediate     TOBRAMYCIN <=1  SENSITIVE Sensitive     TRIMETH/SULFA <=20 SENSITIVE Sensitive     PIP/TAZO >=128 RESISTANT Resistant     * ENTEROBACTER CLOACAE   Enterococcus species - MIC*    AMPICILLIN <=2 SENSITIVE Sensitive     LEVOFLOXACIN 0.5 SENSITIVE Sensitive     NITROFURANTOIN <=16 SENSITIVE Sensitive     VANCOMYCIN 2 SENSITIVE Sensitive     TETRACYCLINE >=16 RESISTANT Resistant     * ENTEROCOCCUS SPECIES  Culture, blood (routine x 2)     Status: None (Preliminary result)   Collection Time: 04/06/15  4:40 PM  Result Value Ref Range Status   Specimen Description BLOOD RIGHT ARM  Final   Special Requests BOTTLES DRAWN AEROBIC AND ANAEROBIC 10CC  Final   Culture   Final           BLOOD CULTURE RECEIVED NO GROWTH TO DATE CULTURE WILL BE HELD FOR 5 DAYS BEFORE ISSUING A FINAL NEGATIVE REPORT Performed at [facility]    Report Status PENDING  Incomplete  Culture, blood (routine x 2)     Status: None (Preliminary result)   Collection Time: 04/06/15  4:50 PM  Result Value Ref Range Status   Specimen Description BLOOD RIGHT HAND  Final   Special Requests BOTTLES DRAWN AEROBIC AND ANAEROBIC 10CC  Final   Culture   Final           BLOOD CULTURE RECEIVED NO GROWTH TO DATE CULTURE WILL BE HELD FOR 5 DAYS BEFORE ISSUING A FINAL NEGATIVE REPORT Performed at [facility]    Report Status PENDING  Incomplete    Studies/Results: No results found.  Assessment/Plan:  1) Fluid collection - she remains afebrile.  WBC stable.  Off antibiotics.   CT again later this week but progressing and no signs of infection.   I will sign off, please call for any questions or new concerns.  thanks  Staci Righter, MD [facility] [email or web address] [phone] pager   [phone] cell 04/10/2015, 3:18 PM

## 2015-04-10 NOTE — Progress Notes (Signed)
Patient ID: [name], female   DOB: 1933-09-21, 81 y.o.   MRN: [id number] 17 Days Post-Op  Subjective: I'm tired and my stomach hurts. Indicates discomfort around her low midline incision  Objective: Vital signs in last 24 hours: Temp:  [97.7 F (36.5 C)-99.5 F (37.5 C)] 99.5 F (37.5 C) (05/29 0500) Pulse Rate:  [86-102] 95 (05/29 0500) Resp:  [17-20] 20 (05/29 0500) BP: (137-163)/(59-85) 163/85 mmHg (05/29 0500) SpO2:  [99 %-100 %] 99 % (05/29 0500) Last BM Date: 04/10/15  Intake/Output from previous day: 05/28 0701 - 05/29 0700 In: 1961.9 [P.O.:360; I.V.:220.3; TPN:1351.6] Out: 43 [Urine:5; Drains:38] Intake/Output this shift:    General appearance: alert, cooperative, no distress and more alert today than yesterday Resp: no wheezing or increased work of breathing GI: tender to light touch throughout abdomen as on previous exams Incision/Wound: clean and dry without evidence of infection.  JP drainage is serous  Lab Results:   Recent Labs  04/08/15 1213 04/09/15 0503  WBC 11.8* 11.7*  HGB 8.5* 7.7*  HCT 27.5* 24.9*  PLT 605* 538*   BMET  Recent Labs  04/09/15 0503 04/10/15 0500  NA 133* 134*  K 4.4 4.1  CL 104 106  CO2 20* 20*  GLUCOSE 125* 131*  BUN 41* 40*  CREATININE 0.70 0.71  CALCIUM 9.1 9.0     Studies/Results: Ct Abdomen Pelvis W Contrast  04/08/2015   CLINICAL DATA:  SOB, Abscess drains 04/04/2015. Surg: TAH, Gb, tubal, laparoscopy adhesion. Hx of aortic thoracic aneurysm. Elev WBC's, fever.  EXAM: CT ABDOMEN AND PELVIS WITH CONTRAST  TECHNIQUE: Multidetector CT imaging of the abdomen and pelvis was performed using the standard protocol following bolus administration of intravenous contrast.  CONTRAST:  OMNIPAQUE IOHEXOL 300 MG/ML  SOLN  COMPARISON:  04/03/2015 and earlier studies  FINDINGS: Small left pleural effusion. Consolidation/atelectasis with air bronchograms in the posterior and medial basal segments left lower lobe, and  patchy airspace disease posteriorly at the right lung base. Coronary calcifications.  Stable percutaneous drain from intercostal approach posterior to the spleen, with some decrease in the subcapsular/subphrenic collection, with moderate residual. Catheter is at the dependent posterior aspect of the residual component.  Stable left mid abdominal percutaneous pigtail drain from an intercostal approach, with decrease in size of thick-walled complex fluid collection, residual 2.1 x 4.3 cm. Catheter appears well positioned centrally within the residual.  No new intra-abdominal collections. Surgical clips in the gallbladder fossa. Stable mild central intrahepatic biliary ductal dilatation and dilatation of the common duct. No focal liver lesion. Unremarkable spleen, adrenal glands, pancreas, kidneys. No hydronephrosis. Patchy aortoiliac calcifications without aneurysm or stenosis. Portal vein patent. Stomach, small bowel, and colon are nondilated. Multiple diverticula from the sigmoid colon without adjacent inflammatory/edematous change. The urinary bladder is incompletely distended. Bilateral pelvic phleboliths. Old healed pubic fractures. Spondylitic changes in the mid and lower lumbar spine, with mild dextroscoliosis.  IMPRESSION: 1. Some interval improvement in subphrenic and left mid abdomen abscess collections post percutaneous drain placement x2. Both have residual undrained components but the drain catheters appear well positioned. 2. Persistent small left pleural effusion and bibasilar airspace disease left greater than right. 3. Atherosclerosis, including aortoiliac and coronary artery disease. Please note that although the presence of coronary artery calcium documents the presence of coronary artery disease, the severity of this disease and any potential stenosis cannot be assessed on this non-gated CT examination. Assessment for potential risk factor modification, dietary therapy or pharmacologic therapy may  be warranted,  if clinically indicated.   Electronically Signed   By: Corlis Leak M.D.   On: 04/08/2015 09:49    Anti-infectives: Anti-infectives    Start     Dose/Rate Route Frequency Ordered Stop   04/07/15 2000  vancomycin (VANCOCIN) 500 mg in sodium chloride 0.9 % 100 mL IVPB  Status:  Discontinued     500 mg 100 mL/hr over 60 Minutes Intravenous Every 12 hours 04/07/15 1859 04/09/15 1500   04/07/15 1200  fluconazole (DIFLUCAN) IVPB 200 mg  Status:  Discontinued     200 mg 100 mL/hr over 60 Minutes Intravenous Every 24 hours 04/07/15 1120 04/08/15 1535   03/26/15 1600  cefTRIAXone (ROCEPHIN) 2 g in dextrose 5 % 50 mL IVPB - Premix    Comments:  Pharmacy may adjust dosing strength / duration / interval for maximal efficacy   2 g 100 mL/hr over 30 Minutes Intravenous Every 24 hours 03/26/15 1007 03/30/15 1535   03/26/15 1200  metroNIDAZOLE (FLAGYL) IVPB 500 mg     500 mg 100 mL/hr over 60 Minutes Intravenous Every 6 hours 03/26/15 1007 03/30/15 1850   03/25/15 1600  cefOXitin (MEFOXIN) 1 g in dextrose 5 % 50 mL IVPB  Status:  Discontinued     1 g 100 mL/hr over 30 Minutes Intravenous Every 12 hours 03/25/15 0801 03/26/15 1007   03/25/15 0800  cefOXitin (MEFOXIN) 1 g in dextrose 5 % 50 mL IVPB  Status:  Discontinued     1 g 100 mL/hr over 30 Minutes Intravenous 3 times per day 03/25/15 0752 03/25/15 0758   03/24/15 1700  cefOXitin (MEFOXIN) 1 g in dextrose 5 % 50 mL IVPB     1 g 100 mL/hr over 30 Minutes Intravenous Every 6 hours 03/24/15 1511 03/25/15 0512   03/24/15 0915  cefOXitin (MEFOXIN) 2 g in dextrose 5 % 50 mL IVPB    Comments:  Pharmacy may adjust dosing strength, interval, or rate of medication as needed for optimal therapy for the patient Send with patient on call to the OR.  Anesthesia to complete antibiotic administration <86min prior to incision per [facility].   2 g 100 mL/hr over 30 Minutes Intravenous On call to O.R. 03/24/15 0910 03/24/15 1042       Assessment/Plan: s/p Procedure(s): LAPAROSCOPY DIAGNOSTIC LAPAROSCOPIC LYSIS OF ADHESIONS LAPAROTOMY with decompression of bowel Recent CT shows improvement in fluid collections and drainage is serous. Would repeat CT later this week and hopefully remove drains if continued improvement Overall slow progress with multiple comorbidities.   LOS: 23 days    Tearsa Kowalewski T 04/10/2015

## 2015-04-11 LAB — DIFFERENTIAL
BASOS ABS: 0.1 10*3/uL (ref 0.0–0.1)
BASOS PCT: 1 % (ref 0–1)
Eosinophils Absolute: 0.2 10*3/uL (ref 0.0–0.7)
Eosinophils Relative: 3 % (ref 0–5)
Lymphocytes Relative: 11 % — ABNORMAL LOW (ref 12–46)
Lymphs Abs: 0.9 10*3/uL (ref 0.7–4.0)
MONO ABS: 0.7 10*3/uL (ref 0.1–1.0)
Monocytes Relative: 9 % (ref 3–12)
NEUTROS ABS: 6.2 10*3/uL (ref 1.7–7.7)
NEUTROS PCT: 76 % (ref 43–77)

## 2015-04-11 LAB — BASIC METABOLIC PANEL
Anion gap: 8 (ref 5–15)
BUN: 42 mg/dL — ABNORMAL HIGH (ref 6–20)
CO2: 19 mmol/L — AB (ref 22–32)
Calcium: 9.2 mg/dL (ref 8.9–10.3)
Chloride: 108 mmol/L (ref 101–111)
Creatinine, Ser: 0.67 mg/dL (ref 0.44–1.00)
GFR calc non Af Amer: >60 mL/min (ref >60)
Glucose, Bld: 104 mg/dL — ABNORMAL HIGH (ref 65–99)
Potassium: 3.9 mmol/L (ref 3.5–5.1)
SODIUM: 135 mmol/L (ref 135–145)

## 2015-04-11 LAB — CBC
HEMATOCRIT: 24.4 % — AB (ref 36.0–46.0)
Hemoglobin: 7.7 g/dL — ABNORMAL LOW (ref 12.0–15.0)
MCH: 25.6 pg — ABNORMAL LOW (ref 26.0–34.0)
MCHC: 31.6 g/dL (ref 30.0–36.0)
MCV: 81.1 fL (ref 78.0–100.0)
PLATELETS: 618 10*3/uL — AB (ref 150–400)
RBC: 3.01 MIL/uL — AB (ref 3.87–5.11)
RDW: 20.6 % — ABNORMAL HIGH (ref 11.5–15.5)
WBC: 8.1 10*3/uL (ref 4.0–10.5)

## 2015-04-11 LAB — GLUCOSE, CAPILLARY
GLUCOSE-CAPILLARY: 172 mg/dL — AB (ref 65–99)
GLUCOSE-CAPILLARY: 198 mg/dL — AB (ref 65–99)
GLUCOSE-CAPILLARY: 231 mg/dL — AB (ref 65–99)
Glucose-Capillary: 118 mg/dL — ABNORMAL HIGH (ref 65–99)
Glucose-Capillary: 81 mg/dL (ref 65–99)
Glucose-Capillary: 132 mg/dL — ABNORMAL HIGH (ref 65–99)

## 2015-04-11 LAB — PREALBUMIN: PREALBUMIN: 15.1 mg/dL — AB (ref 18–38)

## 2015-04-11 LAB — TRIGLYCERIDES: TRIGLYCERIDES: 88 mg/dL (ref <150)

## 2015-04-11 LAB — MAGNESIUM: Magnesium: 1.9 mg/dL (ref 1.7–2.4)

## 2015-04-11 LAB — PHOSPHORUS: Phosphorus: 4.2 mg/dL (ref 2.5–4.6)

## 2015-04-11 MED ORDER — FAT EMULSION 20 % IV EMUL
240.0000 mL | INTRAVENOUS | Status: AC
  Administered 2015-04-11: 240 mL via INTRAVENOUS
  Filled 2015-04-11: qty 250

## 2015-04-11 MED ORDER — MAGNESIUM SULFATE IN D5W 10-5 MG/ML-% IV SOLN
1.0000 g | Freq: Once | INTRAVENOUS | Status: AC
  Administered 2015-04-11: 1 g via INTRAVENOUS
  Filled 2015-04-11: qty 100

## 2015-04-11 MED ORDER — TRACE MINERALS CR-CU-MN-SE-ZN 10-1000-500-60 MCG/ML IV SOLN
INTRAVENOUS | Status: AC
  Administered 2015-04-11: 17:00:00 via INTRAVENOUS
  Filled 2015-04-11: qty 1680

## 2015-04-11 NOTE — Progress Notes (Signed)
Patient ID: [name], female   DOB: [birth year], 81 y.o.   MRN: [id number] Patient ID: [name], female   DOB: 01-Nov-1933, 81 y.o.   MRN: [id number] 18 Days Post-Op  Subjective: I don't feel good. Can't be more specific.  Objective: Vital signs in last 24 hours: Temp:  [98 F (36.7 C)-99.1 F (37.3 C)] 98 F (36.7 C) (05/30 0458) Pulse Rate:  [90-113] 90 (05/30 0458) Resp:  [18-20] 18 (05/30 0458) BP: (140-177)/(64-77) 177/76 mmHg (05/30 0458) SpO2:  [99 %-100 %] 100 % (05/30 0458) Last BM Date: 04/10/15  Intake/Output from previous day: 05/29 0701 - 05/30 0700 In: 3325.7 [P.O.:1380; I.V.:250; TPN:1675.7] Out: 772 [Urine:740; Drains:32] Intake/Output this shift:    General appearance: very drowsy but responsive, cooperative, no distress  Resp: no wheezing or increased work of breathing GI: mild tenderness to light touch throughout abdomen as on previous exams Incision/Wound: clean and dry without evidence of infection.  JP drainage is serous  Lab Results:   Recent Labs  04/09/15 0503 04/11/15 0447  WBC 11.7* 8.1  HGB 7.7* 7.7*  HCT 24.9* 24.4*  PLT 538* 618*   BMET  Recent Labs  04/10/15 0500 04/11/15 0447  NA 134* 135  K 4.1 3.9  CL 106 108  CO2 20* 19*  GLUCOSE 131* 104*  BUN 40* 42*  CREATININE 0.71 0.67  CALCIUM 9.0 9.2     Studies/Results: No results found.  Anti-infectives: Anti-infectives    Start     Dose/Rate Route Frequency Ordered Stop   04/07/15 2000  vancomycin (VANCOCIN) 500 mg in sodium chloride 0.9 % 100 mL IVPB  Status:  Discontinued     500 mg 100 mL/hr over 60 Minutes Intravenous Every 12 hours 04/07/15 1859 04/09/15 1500   04/07/15 1200  fluconazole (DIFLUCAN) IVPB 200 mg  Status:  Discontinued     200 mg 100 mL/hr over 60 Minutes Intravenous Every 24 hours 04/07/15 1120 04/08/15 1535   03/26/15 1600  cefTRIAXone (ROCEPHIN) 2 g in dextrose 5 % 50 mL IVPB - Premix    Comments:  Pharmacy may adjust dosing strength /  duration / interval for maximal efficacy   2 g 100 mL/hr over 30 Minutes Intravenous Every 24 hours 03/26/15 1007 03/30/15 1535   03/26/15 1200  metroNIDAZOLE (FLAGYL) IVPB 500 mg     500 mg 100 mL/hr over 60 Minutes Intravenous Every 6 hours 03/26/15 1007 03/30/15 1850   03/25/15 1600  cefOXitin (MEFOXIN) 1 g in dextrose 5 % 50 mL IVPB  Status:  Discontinued     1 g 100 mL/hr over 30 Minutes Intravenous Every 12 hours 03/25/15 0801 03/26/15 1007   03/25/15 0800  cefOXitin (MEFOXIN) 1 g in dextrose 5 % 50 mL IVPB  Status:  Discontinued     1 g 100 mL/hr over 30 Minutes Intravenous 3 times per day 03/25/15 0752 03/25/15 0758   03/24/15 1700  cefOXitin (MEFOXIN) 1 g in dextrose 5 % 50 mL IVPB     1 g 100 mL/hr over 30 Minutes Intravenous Every 6 hours 03/24/15 1511 03/25/15 0512   03/24/15 0915  cefOXitin (MEFOXIN) 2 g in dextrose 5 % 50 mL IVPB    Comments:  Pharmacy may adjust dosing strength, interval, or rate of medication as needed for optimal therapy for the patient Send with patient on call to the OR.  Anesthesia to complete antibiotic administration <58min prior to incision per [facility].   2 g 100 mL/hr over 30  Minutes Intravenous On call to O.R. 03/24/15 0910 03/24/15 1042      Assessment/Plan: s/p Procedure(s): LAPAROSCOPY DIAGNOSTIC LAPAROSCOPIC LYSIS OF ADHESIONS LAPAROTOMY with decompression of bowel Recent CT shows improvement in fluid collections and drainage is serous. Signs of infection improving, white blood count normalized.  Would repeat CT later this week and hopefully remove drains if continued improvement Overall slow progress with multiple comorbidities.   LOS: 24 days    Patricia Perales T 04/11/2015

## 2015-04-11 NOTE — Progress Notes (Signed)
Physical Therapy Treatment Patient Details Name: [name] MRN: [id number] DOB: 11/30/1932 Today's Date: 04/11/2015    History of Present Illness 81 year old female with history of hypertension, coronary artery disease, hyperlipidemia, diabetes and pt s/p lap converted to open LOA for SBO on 5/12, complicated by Left sided facial droop / right arm weakness / acute CVA in left lentiform nucleus however neurologist note states reviewed MRI and no acute changes seen    PT Comments    Pt walked 20' with EVA walker and +35mod/max assist today!  Family present and encouraging; pt emotional during session and reports being quite fatigued but pleased that she walked; HR 99-111 before and during mobility; will continue follow; family supportive  Follow Up Recommendations  Home health PT;Supervision/Assistance - 24 hour;SNF (family wants to take pt home)     Equipment Recommendations  Rolling walker with 5" wheels    Recommendations for Other Services       Precautions / Restrictions Precautions Precautions: Fall Precaution Comments: 2 JP drains Restrictions Weight Bearing Restrictions: No    Mobility  Bed Mobility Overal bed mobility: Needs Assistance Bed Mobility: Sit to Supine;Rolling;Sidelying to Sit Rolling: Min assist Sidelying to sit: +2 for physical assistance;Mod assist   Sit to supine: +2 for physical assistance;Max assist   General bed mobility comments: cues for participation, technique; pt used rail to roll, HOB at 30*  Transfers Overall transfer level: Needs assistance Equipment used:  (EVA walker) Transfers: Sit to/from [employer] (x2 from bed and chair) Sit to Stand: +2 physical assistance;Max assist Stand pivot transfers: +2 physical assistance;Max assist (bil HHA)       General transfer comment: multi-modal cues for hand placement, st shift and consistent effort  Ambulation/Gait Ambulation/Gait assistance: Mod assist;+2 physical  assistance Ambulation Distance (Feet): 20 Feet Assistive device:  (EVA walker) Gait Pattern/deviations: Step-through pattern;Decreased step length - right;Decreased step length - left;Trunk flexed     General Gait Details: multi-modal cues for posture, step length, use of UB, breathing and pt effort (3rd person pushing chair for safety)   Stairs            Wheelchair Mobility    Modified Rankin (Stroke Patients Only)       Balance Overall balance assessment: Needs assistance Sitting-balance support: No upper extremity supported;Feet supported Sitting balance-Leahy Scale: Fair     Standing balance support: During functional activity;Bilateral upper extremity supported Standing balance-Leahy Scale: Poor                      Cognition Arousal/Alertness: Awake/alert Behavior During Therapy: WFL for tasks assessed/performed Overall Cognitive Status: Within Functional Limits for tasks assessed                      Exercises      General Comments        Pertinent Vitals/Pain Pain Assessment: Faces Faces Pain Scale: Hurts little more Pain Location: "all over" Pain Descriptors / Indicators: Sore Pain Intervention(s): Limited activity within patient's tolerance;Monitored during session;Repositioned;Patient requesting pain meds-RN notified    Home Living                      Prior Function            PT Goals (current goals can now be found in the care plan section) Acute Rehab PT Goals Patient Stated Goal: none stated PT Goal Formulation: With patient Time For Goal Achievement: 04/12/15 Potential to  Achieve Goals: Fair Progress towards PT goals: Progressing toward goals    Frequency  Min 3X/week    PT Plan Current plan remains appropriate    Co-evaluation             End of Session Equipment Utilized During Treatment: Gait belt Activity Tolerance: Patient tolerated treatment well;Patient limited by fatigue Patient left:  in bed;with call bell/phone within reach;with family/visitor present     Time: 9532-0233 PT Time Calculation (min) (ACUTE ONLY): 25 min  Charges:  $Gait Training: 8-22 mins $Therapeutic Activity: 8-22 mins                    G Codes:      [name] 04/28/2015, 3:10 PM

## 2015-04-11 NOTE — Progress Notes (Signed)
PARENTERAL NUTRITION CONSULT NOTE - follow up  Pharmacy Consult for TPN Indication: bowel obstruction  Allergies  Allergen Reactions  . Amlodipine Besylate     REACTION: dizzy  . Aspirin   . Atenolol     REACTION: fatigue  . Benazepril     cough  . Benicar [Olmesartan Medoxomil]     HA  . Cozaar     nausea  . Hydrochlorothiazide W-Triamterene     REACTION: dizzy  . Hydrocodone     REACTION: HA  . Hydroxyzine Pamoate   . Iodine   . Lisinopril     REACTION: tired, cough  . Penicillins Itching    tolerates cephalosporins OK  . Pravastatin     myalgias  . Prednisolone     My stomach hurts  . Tramadol Hcl     REACTION: HA    Patient Measurements: Height: 5' 4"  (162.6 cm) Weight: 149 lb 4 oz (67.7 kg) IBW/kg (Calculated) : 54.7 Adjusted Body Weight: 57.5kg   Vital Signs: Temp: 98 F (36.7 C) (05/30 0458) Temp Source: Oral (05/30 0458) BP: 177/76 mmHg (05/30 0458) Pulse Rate: 90 (05/30 0458) Intake/Output from previous day: 05/29 0701 - 05/30 0700 In: 3325.7 [P.O.:1380; I.V.:250; TPN:1675.7] Out: 772 [Urine:740; Drains:32] Intake/Output from this shift: Total I/O In: 2043.7 [P.O.:1020; I.V.:123.7; Other:20; TPN:880] Out: 212 [Urine:200; Drains:12]  Labs:  Recent Labs  04/08/15 1213 04/09/15 0503 04/11/15 0447  WBC 11.8* 11.7* 8.1  HGB 8.5* 7.7* 7.7*  HCT 27.5* 24.9* 24.4*  PLT 605* 538* 618*     Recent Labs  04/09/15 0503 04/10/15 0500 04/11/15 0447  NA 133* 134* 135  K 4.4 4.1 3.9  CL 104 106 108  CO2 20* 20* 19*  GLUCOSE 125* 131* 104*  BUN 41* 40* 42*  CREATININE 0.70 0.71 0.67  CALCIUM 9.1 9.0 9.2  MG  --  1.5* 1.9  PHOS  --  4.2 4.2  PROT 7.2  --   --   ALBUMIN 1.9*  --   --   AST 39  --   --   ALT 31  --   --   ALKPHOS 199*  --   --   BILITOT 0.4  --   --    Estimated Creatinine Clearance: 53.9 mL/min (by C-G formula based on Cr of 0.67).    Recent Labs  04/10/15 1724 04/10/15 2001 04/11/15 0048  GLUCAP 164* 127*  198*    Insulin Requirements: 12 units SSI last 24hr  Current Nutrition:  Dysphagia 3 diet (start 5/20) - 10-25% meals charted as eaten Ensure BID ordered on 5/20 (took both cans yesterday)  IVF: NS @ 10 ml/hr  Central access: PICC 5/11 TPN start date: 5/11  ASSESSMENT                                                                                                          HPI: 81 year old female with history of hypertension, coronary artery disease, hyperlipidemia, iron deficiency anemia, depression, osteoporosis, mild chronic kidney disease presented to the ED with ongoing nausea for  4 days and diffuse crampy lower abdominal pain since one day prior to admission. Associated poor by mouth intake and generalized weakness. Patient was found to have small bowel obstruction.  Pharmacy consulted to start TPN.  Significant events:  5/12: laparoscopic lysis of adhesions with laparotomy and decompression of bowel 5/13: new AKI 5/15: Continue NG tube and NPO d/t bilious output pending BM.   5/19: Remove NGT 5/20 Completed swallow eval - starting dysphagia diet 5/21: TPN changed to electrolytes free d/t high K+; given kayexalate.  Ate about 10% of lunch 5/22: IR drainage of 2 abd abscesses, drains placed 5/24: Thrush noted.   5/25: Per RN, TPN found still running at 50 ml/hr this AM, notified [facility] and changed rate to 40 ml/hr around 0830. 5/27: Minor improvements in appetite; surgery considering PEG if not improving soon 5/29: per CT improvement in fluid collections and drainage is serous - CCS hopeful to remove drains later this week if improvement continues. Lytes added back to TPN   Today:   Glucose (goal <150) - CBGs at goal last 24h - range 104-198  Electrolytes - All lytes wnl except Mag slightly low at 1.9 (s/p repletion with 2gm on 5/29)  Renal -  SCr improved to wnl; CrCl 54  LFTs - Alk phos slightly elevated but stable.  Others wnl (5/23)  TGs - wnl (5/23)  Prealbumin:  greatly improved 2.7 > 13.7 (5/23)  NUTRITIONAL GOALS                                                                                             RD recs (5/18): 75-85 g/day protein, 1500-1700 Kcal/day. Clinimix E 5/15 at a goal rate of 50m/hr + 20% fat emulsion at 133mhr to provide: 84 g/day protein, 1672 Kcal/day.  PLAN                                                                                                                         1) Magnesium sulfate 1gm IV x1 today 2) At 1800 today:  continue Clinimix E 5/15 to goal rate of 70 ml/hr Continue 20% fat emulsion at 1020mr.   TPN to contain standard multivitamins and trace elements.  Maintain IVF at [facility] q4h for now since increasing TPN rate again this evening.    TPN lab panels on Mondays & Thursdays.  F/u Surgery plans for continued TPN vs PEG placement  AnhDia SitterharmD, BCPS 04/11/2015 7:08 AM

## 2015-04-11 NOTE — Progress Notes (Signed)
Progress Note   [name] ZOX:[id number] DOB: 27-Feb-1933 DOA: 03/18/2015 PCP: [name], MD   Brief Narrative:   [name] is an 81 y.o. female with a PMH of hypertension, CAD, hyperlipidemia and diabetes who was admitted 03/18/15 with SBO secondary to adhesions. She failed conservative therapy and underwent laparoscopic lysis of adhesions/laparotomy with bowel decompression on 03/24/15. Her hospital course was complicated by sepsis secondary to peritoneal contamination with transfer to the SDU on 03/25/15. She also developed left-sided facial droop/right arm weakness 03/27/15 with CT scan showing a 4 mm lacunar infarct in the left lentiform nucleus. MRI failed to show any abnormality. Due to failure to improve an ongoing high need for pain control, a repeat CT scan was done 04/02/15 which showed 2 large fluid collections. She underwent percutaneous drainage with placement of abscess drains by IR 04/04/15. Patient has fever on 5/25, but so far all cultures are negative and repeat CXR shows worsening atelectasis and no pneumonia. Repeat CT today shows improvement in the abscess. The abscess culture grew enterococcus and vancomycin started. ID consulted for recommendations. Since she has been afebrile for over 48 hours, recommended to stop the vancomycin. No new complaints.   Assessment/Plan:   Principal Problem:   Small bowel obstruction s/p exlap/LOA/decompression 03/25/2015 complicated by severe sepsis secondary to peritonitis / abdominal pain now complicated by intra-abdominal abscess/fluid collection - Status post laparoscopic lysis of adhesions/laparotomy with bowel decompression 03/24/15. - Subsequently developed acute peritonitis/sepsis. - Initially treated with Mefoxin starting 03/25/15. Antibiotics switched to Flagyl/Rocephin 03/26/15.   - CT scan of the abdomen/pelvis done 04/02/15 which showed 2 large fluid collections.  - 2 percutaneous drains placed by IR 04/03/15 to drain  fluid collections.  -Repeat CT today shows improvement in the abscess. The abscess culture grew enterococcus and vancomycin started. ID consulted for recommendations. Plan to d/c vancomycin today as she remained afebrile and she is improving.   - afebrile and leukcytosis is improving.   Active Problems:   Facial droop / right arm weakness / possible acute CVA left lentiform nucleus - On 03/27/15, CT of the head done to evaluate symptoms showed an acute 4 mm lacunar infarct in the left lentiform nucleus. - Subsequent MRI negative, although clinical findings consistent with acute CVA. MRA negative for stenosis. - 2-D echo with normal EF, no diastolic dysfunction noted. No intracardiac masses or thrombi. - Carotid Dopplers negative for significant stenosis (1-39% bilaterally). - Hemoglobin A1c 6.3%. Cholesterol 99. - Evaluated by neurology, no further stroke workup recommended. - Continue aspirin. - PT/OT.    Hypokalemia/hypomagnesemia - Monitor and replace electrolytes as needed.    Acute postoperative blood loss anemia - Status post 2 units of PRBCs 04/01/15. - Continue to monitor hemoglobin and transfuse for hemoglobin less than 7.    Diabetes type 2, controlled - Currently being managed with moderate scale SSI, change to AC HS.  CBGs well controlled.  - Hemoglobin A1c 6.3%. CBG (last 3)   Recent Labs  04/11/15 0426 04/11/15 0733 04/11/15 1147  GLUCAP 118* 172* 231*        Anxiety state/Adjustment disorder with mixed anxiety and depressed mood - Mood has been depressed with limited motivation to participate in PT.    Coronary atherosclerosis - Continue aspirin therapy.    Acute renal failure secondary to sepsis  - Resolved with IV fluids.    Right foot pain  - Radiographs negative for fracture. Degenerative changes noted.    Polymyalgia rheumatica/Chronic fatigue disorder -  PT/OT.    Elevated blood pressure - Continue as needed Lopressor.    Malnutrition of  moderate degree - Continue TPN for nutritional support. Begin to wean.    Acute delirium - Avoid sedating medications. Family requests patient does not get Ativan or Haldol or percocet or morphine.    Thrombocytopenia - Mild. Likely related to recent sepsis.  Constipation: Increased her miralax, pt requesting for enema. Improved. One bm yesterday after enema.     DVT Prophylaxis - Continue SCDs.  Code Status: Full. Family Communication: daughter present today.   Disposition Plan: Home with daughter when diet advanced,.   IV Access:    PICC placed 03/23/15   Procedures and diagnostic studies:   Dg Chest 2 View  04/06/2015   CLINICAL DATA:  Fever  EXAM: CHEST  2 VIEW  COMPARISON:  03/27/2015  FINDINGS: Cardiac shadow is mildly enlarged. Left basilar atelectasis is again identified. Two recently placed drainage catheters are noted in the left upper abdomen. Mild right basilar atelectasis is noted. A left-sided PICC line is noted with the tip at the junction of the innominate veins. This has withdrawn slightly in the interval from the prior exam. No acute bony abnormality is seen.  IMPRESSION: Bibasilar atelectasis. This has increased in the interval from the prior exam.  Left upper quadrant drainage catheters.   Electronically Signed   By: Alcide Clever M.D.   On: 04/06/2015 14:57   Ct Head Wo Contrast  03/27/2015   CLINICAL DATA:  RIGHT-sided facial droop. Symptoms began earlier today. Altered mental status.  EXAM: CT HEAD WITHOUT CONTRAST  TECHNIQUE: Contiguous axial images were obtained from the base of the skull through the vertex without intravenous contrast.  COMPARISON:  05/27/2014.  FINDINGS: Possible acute 4 mm lacune, LEFT lentiform nucleus as seen on image 12 series 5. This was not clearly present on in 2015 scan.  No other areas of concern for cortical or subcortical infarction.  No hemorrhage, mass lesion, hydrocephalus, or extra-axial fluid.  Cerebral and cerebellar atrophy.  Generalized white matter hypoattenuation, likely small vessel disease. Than the calvarium intact. Vascular calcification. No sinus or mastoid disease. Dense lenticular opacities.  IMPRESSION: Possible acute 4 mm lacunar infarct LEFT lentiform nucleus. If further investigation desired, and no contraindications, consider MRI brain.   Electronically Signed   By: Davonna Belling M.D.   On: 03/27/2015 16:25   Mr Maxine Glenn Head Wo Contrast  03/28/2015   CLINICAL DATA:  81 year old with right-sided facial droop. Altered mental status. Subsequent encounter.  EXAM: MRI HEAD WITHOUT CONTRAST  MRA HEAD WITHOUT CONTRAST  TECHNIQUE: Multiplanar, multiecho pulse sequences of the brain and surrounding structures were obtained without intravenous contrast. Angiographic images of the head were obtained using MRA technique without contrast.  COMPARISON:  03/27/2015 head CT.  FINDINGS: MRI HEAD FINDINGS  No acute infarct.  No intracranial hemorrhage.  Remote tiny infarct right cerebellum. Minimal small vessel disease type changes.  Incidentally noted is a prominent peri vascular space left lenticular nucleus.  Global atrophy without hydrocephalus.  No intracranial mass lesion noted on this unenhanced exam.  Cervical medullary junction unremarkable. Mild spinal stenosis C3-4.  Expanded partially empty sella without other findings of pseudotumor cerebri  Pineal region unremarkable.  No intracranial mass lesion noted on this unenhanced exam.  MRA HEAD FINDINGS  Mild narrowing pre cavernous/ cavernous segment right internal carotid artery.  Mild ectasia left internal carotid artery cavernous segment. The minimal bulge along the left internal carotid artery cavernous segment most  likely related to slight ectasia of the left ophthalmic artery origin rather than small aneurysm. Mild motion degradation limits evaluation.  Slightly ectatic anterior communicating artery without aneurysm.  Middle cerebral artery mild branch vessel irregularity  bilaterally.  No significant stenosis distal vertebral arteries or basilar artery. Regions of mild irregularity and slight narrowing.  IMPRESSION: MRI HEAD  No acute infarct.  Remote tiny infarct right cerebellum.  Incidentally noted is a prominent peri vascular space left lenticular nucleus.  Global atrophy without hydrocephalus.  Expanded partially empty sella without other findings of pseudotumor cerebri  MRA HEAD FINDINGS  No medium or large size vessel significant stenosis or occlusion. Evaluation of branch vessels slightly limited by motion degradation.   Electronically Signed   By: Lacy Duverney M.D.   On: 03/28/2015 12:02   Mr Brain Wo Contrast  03/28/2015   CLINICAL DATA:  81 year old with right-sided facial droop. Altered mental status. Subsequent encounter.  EXAM: MRI HEAD WITHOUT CONTRAST  MRA HEAD WITHOUT CONTRAST  TECHNIQUE: Multiplanar, multiecho pulse sequences of the brain and surrounding structures were obtained without intravenous contrast. Angiographic images of the head were obtained using MRA technique without contrast.  COMPARISON:  03/27/2015 head CT.  FINDINGS: MRI HEAD FINDINGS  No acute infarct.  No intracranial hemorrhage.  Remote tiny infarct right cerebellum. Minimal small vessel disease type changes.  Incidentally noted is a prominent peri vascular space left lenticular nucleus.  Global atrophy without hydrocephalus.  No intracranial mass lesion noted on this unenhanced exam.  Cervical medullary junction unremarkable. Mild spinal stenosis C3-4.  Expanded partially empty sella without other findings of pseudotumor cerebri  Pineal region unremarkable.  No intracranial mass lesion noted on this unenhanced exam.  MRA HEAD FINDINGS  Mild narrowing pre cavernous/ cavernous segment right internal carotid artery.  Mild ectasia left internal carotid artery cavernous segment. The minimal bulge along the left internal carotid artery cavernous segment most likely related to slight ectasia of  the left ophthalmic artery origin rather than small aneurysm. Mild motion degradation limits evaluation.  Slightly ectatic anterior communicating artery without aneurysm.  Middle cerebral artery mild branch vessel irregularity bilaterally.  No significant stenosis distal vertebral arteries or basilar artery. Regions of mild irregularity and slight narrowing.  IMPRESSION: MRI HEAD  No acute infarct.  Remote tiny infarct right cerebellum.  Incidentally noted is a prominent peri vascular space left lenticular nucleus.  Global atrophy without hydrocephalus.  Expanded partially empty sella without other findings of pseudotumor cerebri  MRA HEAD FINDINGS  No medium or large size vessel significant stenosis or occlusion. Evaluation of branch vessels slightly limited by motion degradation.   Electronically Signed   By: Lacy Duverney M.D.   On: 03/28/2015 12:02   Ct Abdomen Pelvis W Contrast  04/08/2015   CLINICAL DATA:  SOB, Abscess drains 04/04/2015. Surg: TAH, Gb, tubal, laparoscopy adhesion. Hx of aortic thoracic aneurysm. Elev WBC's, fever.  EXAM: CT ABDOMEN AND PELVIS WITH CONTRAST  TECHNIQUE: Multidetector CT imaging of the abdomen and pelvis was performed using the standard protocol following bolus administration of intravenous contrast.  CONTRAST:  OMNIPAQUE IOHEXOL 300 MG/ML  SOLN  COMPARISON:  04/03/2015 and earlier studies  FINDINGS: Small left pleural effusion. Consolidation/atelectasis with air bronchograms in the posterior and medial basal segments left lower lobe, and patchy airspace disease posteriorly at the right lung base. Coronary calcifications.  Stable percutaneous drain from intercostal approach posterior to the spleen, with some decrease in the subcapsular/subphrenic collection, with moderate residual. Catheter is at  the dependent posterior aspect of the residual component.  Stable left mid abdominal percutaneous pigtail drain from an intercostal approach, with decrease in size of  thick-walled complex fluid collection, residual 2.1 x 4.3 cm. Catheter appears well positioned centrally within the residual.  No new intra-abdominal collections. Surgical clips in the gallbladder fossa. Stable mild central intrahepatic biliary ductal dilatation and dilatation of the common duct. No focal liver lesion. Unremarkable spleen, adrenal glands, pancreas, kidneys. No hydronephrosis. Patchy aortoiliac calcifications without aneurysm or stenosis. Portal vein patent. Stomach, small bowel, and colon are nondilated. Multiple diverticula from the sigmoid colon without adjacent inflammatory/edematous change. The urinary bladder is incompletely distended. Bilateral pelvic phleboliths. Old healed pubic fractures. Spondylitic changes in the mid and lower lumbar spine, with mild dextroscoliosis.  IMPRESSION: 1. Some interval improvement in subphrenic and left mid abdomen abscess collections post percutaneous drain placement x2. Both have residual undrained components but the drain catheters appear well positioned. 2. Persistent small left pleural effusion and bibasilar airspace disease left greater than right. 3. Atherosclerosis, including aortoiliac and coronary artery disease. Please note that although the presence of coronary artery calcium documents the presence of coronary artery disease, the severity of this disease and any potential stenosis cannot be assessed on this non-gated CT examination. Assessment for potential risk factor modification, dietary therapy or pharmacologic therapy may be warranted, if clinically indicated.   Electronically Signed   By: Corlis Leak M.D.   On: 04/08/2015 09:49   Ct Abdomen Pelvis W Contrast  04/02/2015   CLINICAL DATA:  Nonspecific abdominal pain. Status post recent laparotomy for small bowel obstruction.  EXAM: CT ABDOMEN AND PELVIS WITH CONTRAST  TECHNIQUE: Multidetector CT imaging of the abdomen and pelvis was performed using the standard protocol following bolus  administration of intravenous contrast.  CONTRAST:  58mL OMNIPAQUE IOHEXOL 300 MG/ML SOLN, OMNIPAQUE IOHEXOL 300 MG/ML SOLN  COMPARISON:  Renal ultrasound dated 03/25/2015 and abdomen and pelvis CT dated 03/18/2015.  FINDINGS: Cholecystectomy clips. Interval midline surgical scar in the skin clips at the level of the pelvis. Loculated fluid with peripheral rim enhancement beneath the left hemidiaphragm and partially surrounding the spleen. This measures 7.7 x 5.3 cm on axial image 16. This measures 7.7 cm in length on coronal image number 92.  There is an additional fluid collection in the lateral aspect of the left mid abdomen, measuring 8.3 x 4.2 cm on axial image number 31. This also has peripheral rim enhancement. This measures 9.8 cm in length on coronal image number 64.  There are dilated small bowel loops in the lower abdomen and upper pelvis. Above the urinary bladder, one of these loops contains fluid, small amount of oral contrast and some gas. No definite separate fluid collection is seen.  A Foley catheter is in the urinary bladder with associated air in the bladder. No free peritoneal air seen. Atheromatous arterial calcifications, including the coronary arteries. Cholecystectomy clips.  Small left pleural effusion. Bilateral lower lobe atelectasis. Multiple colonic diverticula. Small left inguinal hernia containing fat. Diffuse subcutaneous edema. Lumbar and lower thoracic spine degenerative changes and scoliosis.  IMPRESSION: 1. 7.7 x 7.7 x 5.3 cm fluid collection with rim enhancement beneath the left hemidiaphragm and partially surrounding the spleen. This is suspicious for an abscess. 2. 9.8 x 8.3 x 4.2 cm arm left mid abdominal fluid collection with peripheral rim enhancement, suspicious for an abscess. 3. Focal ileus or partial obstruction involving small bowel in the inferior abdomen and upper pelvis. 4. Small left  pleural effusion. 5. Bilateral lower lobe atelectasis. 6. Colonic  diverticulosis.   Electronically Signed   By: Beckie Salts M.D.   On: 04/02/2015 17:38   Ct Abdomen Pelvis W Contrast  03/18/2015   CLINICAL DATA:  Nausea and vomiting for 4 days, anxiety, status post hysterectomy CT pelvis 05/27/2014  EXAM: CT ABDOMEN AND PELVIS WITH CONTRAST  TECHNIQUE: Multidetector CT imaging of the abdomen and pelvis was performed using the standard protocol following bolus administration of intravenous contrast.  CONTRAST:  50mL OMNIPAQUE IOHEXOL 300 MG/ML SOLN, OMNIPAQUE IOHEXOL 300 MG/ML SOLN  COMPARISON:  None.  FINDINGS: Sagittal images of the spine shows diffuse osteopenia. Degenerative changes thoracolumbar spine. The lung bases are unremarkable.  The patient is status post cholecystectomy. Mild intrahepatic biliary ductal dilatation. There is CBD dilatation up to 1.1 cm. No focal hepatic mass. The pancreas, spleen and adrenal glands are unremarkable. Kidneys are symmetrical in size and enhancement. No hydronephrosis or hydroureter. Delayed renal images shows bilateral renal symmetrical excretion.  Atherosclerotic calcifications of abdominal aorta and iliac arteries. Moderate stool noted in right colon and proximal transverse colon. No colonic obstruction. The descending colon and sigmoid colon are empty collapsed. Some stool noted within rectum.  There are fluid distended small bowel loops in mid upper abdomen and left lower abdomen and pelvis. Small amount of fluid/stranding noted a adjacent to small bowel loops in left lower quadrant see axial image 57. Distal small bowel is decreased caliber collapsed. Findings are consistent with small bowel obstruction.  Mild distended urinary bladder. The patient is status post hysterectomy. The terminal ileum is small caliber decompressed. Contrast material is noted within stomach. No any contrast material is noted within small bowel or colon.  IMPRESSION: 1. There are fluid distended small bowel loops with multiple air-fluid levels  highly suspicious for small bowel obstruction. Distal small bowel is small caliber decompressed. Terminal ileum is small caliber. Small amount of fluid/stranding noted adjacent to small bowel loops in left lower quadrant please see images 49 and 57. 2. Moderate stool noted in right colon and proximal transverse colon. Descending colon and sigmoid colon are empty collapsed. 3. No pericecal inflammation. 4. No hydronephrosis or hydroureter. 5. Status post cholecystectomy. Mild intrahepatic biliary ductal dilatation. CBD dilatation up to 1.1 cm. 6. No hydronephrosis or hydroureter. 7. Status post hysterectomy. These results were called by telephone at the time of interpretation on 03/18/2015 at 8:59 am to Dr. Gerhard Munch , who verbally acknowledged these results.   Electronically Signed   By: Natasha Mead M.D.   On: 03/18/2015 08:59   US Renal  03/25/2015   CLINICAL DATA:  Acute renal failure.  EXAM: RENAL / URINARY TRACT ULTRASOUND COMPLETE  COMPARISON:  None.  FINDINGS: Right Kidney:  Length: 8.1 cm. Increased echogenicity consistent with chronic medical renal disease. No mass or hydronephrosis visualized.  Left Kidney:  Length: 9.3 cm. Increased echogenicity consistent chronic medical renal disease. No mass or hydronephrosis visualized.  Bladder:  Bladder decompressed by Foley catheter.  IMPRESSION: Bilateral echodense kidneys consistent chronic medical renal disease. No acute abnormality. No hydronephrosis or bladder distention.   Electronically Signed   By: Maisie Fus  Register   On: 03/25/2015 13:27   Dg Chest Port 1 View  03/27/2015   CLINICAL DATA:  Left PICC placement. Nausea and vomiting. Initial encounter.  EXAM: PORTABLE CHEST - 1 VIEW  COMPARISON:  Chest radiograph performed 03/26/2015  FINDINGS: The patient's left PICC is noted ending about the proximal SVC. An enteric  tube is noted extending below the diaphragm.  The lungs are hypoexpanded. Bibasilar and right upper lung zone airspace opacities may  reflect atelectasis or pneumonia. No pleural effusion or pneumothorax is seen  The cardiomediastinal silhouette is borderline normal in size. No acute osseous abnormalities are identified.  IMPRESSION: 1. Left PICC noted ending about the proximal SVC. 2. Lungs hypoexpanded. Bibasilar and right upper lung zone airspace opacities may reflect atelectasis or pneumonia.  These results were called by telephone at the time of interpretation on 03/27/2015 at 7:33 pm to Nursing in the [facility], who verbally acknowledged these results.   Electronically Signed   By: Roanna Raider M.D.   On: 03/27/2015 19:34   Dg Chest Port 1 View  03/26/2015   CLINICAL DATA:  Atelectasis  EXAM: PORTABLE CHEST - 1 VIEW  COMPARISON:  Yesterday  FINDINGS: NG tube and left PICC are stable. Upper normal heart size. Low volumes. Bibasilar atelectasis is not significantly changed. No pneumothorax.  IMPRESSION: Stable bibasilar atelectasis.   Electronically Signed   By: Jolaine Click M.D.   On: 03/26/2015 08:14   Dg Chest Port 1 View  03/25/2015   CLINICAL DATA:  Tachycardia and hypotension; concern for sepsis  EXAM: PORTABLE CHEST - 1 VIEW  COMPARISON:  May 27, 2014  FINDINGS: There is patchy atelectasis in both lung bases. The degree of inspiration is shallow. There is no frank airspace consolidation. Heart is upper normal in size with pulmonary vascularity within normal limits.  Central catheter tip is in the superior vena cava. Nasogastric tube tip and side port are in the distal stomach. No pneumothorax.  IMPRESSION: Bibasilar atelectasis. No pneumothorax. Degree of inspiration shallow.   Electronically Signed   By: Bretta Bang III M.D.   On: 03/25/2015 09:03   Dg Abd Portable 1v  03/24/2015   CLINICAL DATA:  Small-bowel obstruction  EXAM: PORTABLE ABDOMEN - 1 VIEW  COMPARISON:  03/23/2015  FINDINGS: NG tube is been placed with the tip in the gastric antrum  Small bowel dilatation shows mild interval improvement.  Colon is decompressed with moderate stool in the right colon. Surgical clips in the gallbladder fossa.  IMPRESSION: Mild improvement in small bowel obstruction. NG tube in the gastric antrum.   Electronically Signed   By: Marlan Palau M.D.   On: 03/24/2015 08:25   Dg Abd Portable 1v  03/23/2015   CLINICAL DATA:  Followup small bowel obstruction.  EXAM: PORTABLE ABDOMEN - 1 VIEW  COMPARISON:  03/22/2015  FINDINGS: Dilated loops small bowel are similar to the prior exam allowing for differences in radiographic technique different degrees of magnification.  There is a small amount of air in a nondistended colon.  IMPRESSION: 1. Persistent high-grade partial small bowel obstruction. No significant change from the previous day's study.   Electronically Signed   By: Amie Portland M.D.   On: 03/23/2015 10:01   Dg Abd Portable 1v  03/22/2015   CLINICAL DATA:  81 year old female with recent small bowel obstruction. Initial encounter.  EXAM: PORTABLE ABDOMEN - 1 VIEW  COMPARISON:  03/21/2015 and earlier, including CT Abdomen and Pelvis 03/18/2015.  FINDINGS: Portable AP supine view at 0517 hrs. Enteric tube has been removed. Recurrent dilated gas-filled small bowel loops in the mid abdomen up to 41 mm diameter. Abundant stool now at the splenic flexure. Decreased distal colon gas in the pelvis. Stable cholecystectomy clips. Stable abdominal and pelvic visceral contours. Grossly stable lung bases. Stable visualized osseous structures.  IMPRESSION: NG tube removed with recurrent small bowel obstruction.   Electronically Signed   By: Odessa Fleming M.D.   On: 03/22/2015 07:42   Dg Abd Portable 1v  03/21/2015   CLINICAL DATA:  Small-bowel obstruction .  EXAM: PORTABLE ABDOMEN - 1 VIEW  COMPARISON:  03/20/2015 .  FINDINGS: NG tube noted with tip in the upper portion stomach. Slight advancement should be considered . Surgical clips right upper quadrant Soft tissue structures are unremarkable. Small-bowel distention has improved.  No free air. Stool noted throughout the colon. No acute bony abnormality.  IMPRESSION: 1. NG tube noted with tip in the upper portion of the stomach. Advancement suggested.  2. Interim improvement of small bowel distention. Colonic gas pattern is normal with stool throughout the colon.  These results will be called to the ordering clinician or representative by the Radiologist Assistant, and communication documented in the PACS or zVision Dashboard.   Electronically Signed   By: Maisie Fus  Register   On: 03/21/2015 07:32   Dg Abd Portable 1v  03/20/2015   CLINICAL DATA:  Followup small bowel obstruction.  EXAM: PORTABLE ABDOMEN - 1 VIEW  COMPARISON:  03/19/2015  FINDINGS: Mild small bowel dilation persists most evident in the left upper quadrant. There has been no substantial change from the prior study. Residual contrast is noted in the bladder, also similar to the prior exam. Nasogastric tube tip projects in the distal stomach.  IMPRESSION: Small bowel dilation persists consistent with a persistent partial small bowel obstruction. No change from the previous day's study.   Electronically Signed   By: Amie Portland M.D.   On: 03/20/2015 07:29   Dg Abd Portable 1v  03/19/2015   CLINICAL DATA:  Small bowel obstruction protocol. Small bowel obstruction.  EXAM: PORTABLE ABDOMEN - 1 VIEW  COMPARISON:  03/18/2015.  FINDINGS: This is a 24 hr film. There is no contrast identified in the cecum. On the prior exam at 2046 hr yesterday, oral contrast was present in the stomach. Excreted contrast is present within the urinary bladder. Dilation of small bowel loops measures 36 mm. Cholecystectomy clips are present in the right upper quadrant.  There is less small bowel contrast than expected which may be due to dilution in the proximal small bowel. Nasogastric tube remains in the stomach with the tip at the antrum.  Large amount of stool is present in the colon.  IMPRESSION: Small bowel obstruction with no contrast identified  in the cecum at 24 hr.   Electronically Signed   By: Andreas Newport M.D.   On: 03/19/2015 17:11   Dg Abd Portable 1v-small Bowel Obstruction Protocol-initial, 8 Hr Delay  03/19/2015   CLINICAL DATA:  Small bowel obstruction  EXAM: PORTABLE ABDOMEN - 1 VIEW  COMPARISON:  Radiographs and CT 03/18/2015  FINDINGS: Nasogastric tube extends into the distal stomach. There is contrast in the gastric lumen. There is no significant contrast in the small bowel or colon. Dilated stacked loops of small bowel persist in the mid abdomen, probably unchanged. No free air is evident on this single AP supine portable radiograph.  IMPRESSION: Enteric contrast has not reached the colon. Persistent small bowel dilatation consistent with small-bowel obstruction.   Electronically Signed   By: Ellery Plunk M.D.   On: 03/19/2015 01:48   Dg Abd Portable 1v-small Bowel Protocol-position Verification  03/18/2015   CLINICAL DATA:  Small bowel protocol, nasogastric tube placement  EXAM: PORTABLE ABDOMEN - 1 VIEW  COMPARISON:  Portable exam 1303  hours compared to CT abdomen and pelvis 03/18/2015  FINDINGS: Tip of nasogastric tube projects over distal gastric antrum or duodenal bulb, patient slightly rotated to the RIGHT.  Excreted contrast material within renal collecting systems and distended urinary bladder.  Air-filled loops of dilated small bowel noted in the mid abdomen suspicious for small bowel obstruction.  Some gas and stool remain present within the colon.  Degenerative disc and facet disease changes thoracolumbar spine.  Osseous demineralization.  IMPRESSION: Tip of nasogastric tube projects over either the distal gastric antrum or the duodenal bulb region.  Dilated small bowel loops question small bowel obstruction.   Electronically Signed   By: Ulyses Southward M.D.   On: 03/18/2015 13:16   Dg Foot 2 Views Right  03/22/2015   CLINICAL DATA:  Dorsal right foot pain for 4 days, no known injury, initial encounter  EXAM: RIGHT  FOOT - 2 VIEW  COMPARISON:  None.  FINDINGS: Degenerative changes are noted in the tarsal bones. No acute fracture or dislocation is noted. A small calcaneal spur is noted.  IMPRESSION: Degenerative change without acute abnormality.   Electronically Signed   By: Alcide Clever M.D.   On: 03/22/2015 14:57   Dg Swallowing Func-speech Pathology  04/01/2015    Objective Swallowing Evaluation:    Patient Details  Name: [name] MRN: [id number] Date of Birth: Apr 11, 1933  Today's Date: 04/01/2015 Time: SLP Start Time (ACUTE ONLY): 0835-SLP Stop Time (ACUTE ONLY): 0857 SLP Time Calculation (min) (ACUTE ONLY): 22 min  Past Medical History:  Past Medical History  Diagnosis Date  . CAD (coronary artery disease)     s/p stenting of LAD 1999- cath 5-08 EF normal LAD 30-40% restenosis. D1  50% D2 80% LCX & RCA minimal plaque  . HTN (hypertension)   . Hyperlipemia   . Anemia     iron deficiency  . Depression   . DVT (deep venous thrombosis)   . Gout   . Osteoporosis   . Pancreatitis   . GERD (gastroesophageal reflux disease)   . Renal insufficiency     Cr 1.2-1.3  . Obesity   . Diabetes mellitus   . Polyarthritis     DJD/ possible PMR  . Vitamin D deficiency   . B12 deficiency   . Tinnitus   . Anxiety   . Aneurysm, thoracic aortic   . Constipation   . Urinary frequency   . Vertigo   . Chronic back pain    Past Surgical History:  Past Surgical History  Procedure Laterality Date  . Hemorrhoid surgery    . Abdominal hysterectomy    . Cholecystectomy    . Tubal ligation    . Coronary angioplasty with stent placement  1999    LAD stent  . Cardiac catheterization  2008    L main 20%, LAD stent patent, D1 50%, D2 80% (small), RCA 20%, EF 55-60%   . Laparoscopy N/A 03/24/2015    Procedure: LAPAROSCOPY DIAGNOSTIC;  Surgeon: Luretha Murphy, MD;   Location: [facility];  Service: General;  Laterality: N/A;  . Laparoscopic lysis of adhesions N/A 03/24/2015    Procedure: LAPAROSCOPIC LYSIS OF ADHESIONS;  Surgeon: Luretha Murphy,  MD;  Location:  [facility];  Service: General;  Laterality: N/A;  . Laparotomy N/A 03/24/2015    Procedure: LAPAROTOMY with decompression of bowel;  Surgeon: Luretha Murphy, MD;  Location: [facility];  Service: General;  Laterality: N/A;   HPI:  Other Pertinent Information: Pt is a 82  year old female admitted 03/18/15  with SBO secondary to adhesions. She failed conservative therapy and  underwent laparoscopic lysis of adhesions/laparotomy with bowel  decompression on 03/24/15. Her hospital course was complicated by sepsis  secondary to peritoneal contamination with transfer to the SDU on 03/25/15.  She also developed left-sided facial droop/right arm weakness 03/27/15 with  CT scan showing a 4 mm lacunar infarct in the left lentiform nucleus. MRI  failed to show any abnormality. After removal of NG for suction, MD  ordered MBS prior to initiating diet.   No Data Recorded  Assessment / Plan / Recommendation CHL IP CLINICAL IMPRESSIONS 04/01/2015  Therapy Diagnosis Mild pharyngeal phase dysphagia;Mild oral phase  dysphagia  Clinical Impression Mild oral dysphagia characterized by decreased lingual  formation of bolus, improved with straw sips and improving progressively  during exam. oropharyngeal dysphagia also mild with no penetration or  aspiration. Suspect mild standing secretions intially during exam as  pharyngeal residuals decreased and timing of swallow improved with each  trial. Though swallow was initially delayed, by end of exam pt taking  large consecutive sips of thin with minimal delay and minimal residuals.  Recommend pt initiate a dys 3 (mechanical soft) diet with basic aspiration  precautions of upright posture and occasional second swallow to clear oral  cavity and orophrayngeal residuals. SLP will f/u for tolerance during  acute stay.       CHL IP TREATMENT RECOMMENDATION 04/01/2015  Treatment Recommendations Therapy as outlined in treatment plan below     CHL IP DIET RECOMMENDATION 04/01/2015  SLP Diet Recommendations Dysphagia  3 (Mech soft);Thin  Liquid Administration via (None)  Medication Administration Whole meds with liquid  Compensations Multiple dry swallows after each bite/sip  Postural Changes and/or Swallow Maneuvers (None)     CHL IP OTHER RECOMMENDATIONS 04/01/2015  Recommended Consults (None)  Oral Care Recommendations Oral care BID  Other Recommendations (None)     CHL IP FOLLOW UP RECOMMENDATIONS 03/29/2015  Follow up Recommendations 24 hour supervision/assistance     CHL IP FREQUENCY AND DURATION 04/01/2015  Speech Therapy Frequency (ACUTE ONLY) min 2x/week  Treatment Duration 2 weeks     Pertinent Vitals/Pain NA    SLP Swallow Goals No flowsheet data found.  No flowsheet data found.    CHL IP REASON FOR REFERRAL 04/01/2015  Reason for Referral Objectively evaluate swallowing function     CHL IP ORAL PHASE 04/01/2015  Lips (None)  Tongue (None)  Mucous membranes (None)  Nutritional status (None)  Other (None)  Oxygen therapy (None)  Oral Phase Impaired  Oral - Pudding Teaspoon (None)  Oral - Pudding Cup (None)  Oral - Honey Teaspoon (None)  Oral - Honey Cup (None)  Oral - Honey Syringe (None)  Oral - Nectar Teaspoon (None)  Oral - Nectar Cup (None)  Oral - Nectar Straw (None)  Oral - Nectar Syringe (None)  Oral - Ice Chips (None)  Oral - Thin Teaspoon (None)  Oral - Thin Cup (None)  Oral - Thin Straw (None)  Oral - Thin Syringe (None)  Oral - Puree (None)  Oral - Mechanical Soft (None)  Oral - Regular (None)  Oral - Multi-consistency (None)  Oral - Pill (None)  Oral Phase - Comment (None)      CHL IP PHARYNGEAL PHASE 04/01/2015  Pharyngeal Phase Impaired  Pharyngeal - Pudding Teaspoon (None)  Penetration/Aspiration details (pudding teaspoon) (None)  Pharyngeal - Pudding Cup (None)  Penetration/Aspiration details (pudding cup) (None)  Pharyngeal - Honey Teaspoon (None)  Penetration/Aspiration details (honey teaspoon) (None)  Pharyngeal - Honey Cup (None)  Penetration/Aspiration details (honey cup) (None)  Pharyngeal - Honey  Syringe (None)  Penetration/Aspiration details (honey syringe) (None)  Pharyngeal - Nectar Teaspoon (None)  Penetration/Aspiration details (nectar teaspoon) (None)  Pharyngeal - Nectar Cup (None)  Penetration/Aspiration details (nectar cup) (None)  Pharyngeal - Nectar Straw (None)  Penetration/Aspiration details (nectar straw) (None)  Pharyngeal - Nectar Syringe (None)  Penetration/Aspiration details (nectar syringe) (None)  Pharyngeal - Ice Chips (None)  Penetration/Aspiration details (ice chips) (None)  Pharyngeal - Thin Teaspoon (None)  Penetration/Aspiration details (thin teaspoon) (None)  Pharyngeal - Thin Cup (None)  Penetration/Aspiration details (thin cup) (None)  Pharyngeal - Thin Straw (None)  Penetration/Aspiration details (thin straw) (None)  Pharyngeal - Thin Syringe (None)  Penetration/Aspiration details (thin syringe') (None)  Pharyngeal - Puree (None)  Penetration/Aspiration details (puree) (None)  Pharyngeal - Mechanical Soft (None)  Penetration/Aspiration details (mechanical soft) (None)  Pharyngeal - Regular (None)  Penetration/Aspiration details (regular) (None)  Pharyngeal - Multi-consistency (None)  Penetration/Aspiration details (multi-consistency) (None)  Pharyngeal - Pill (None)  Penetration/Aspiration details (pill) (None)  Pharyngeal Comment (None)      No flowsheet data found.  No flowsheet data found.        Harlon Ditty, [place] CCC-SLP [phone]  Claudine Mouton 04/01/2015, 10:31 AM    Ct Image Guided Drainage Percut Cath  Peritoneal Retroperit  04/04/2015   CLINICAL DATA:  81 year old with postoperative fluid collections. Concern for intra-abdominal abscesses.  EXAM: CT-GUIDED DRAIN PLACEMENT IN LEFT UPPER ABDOMINAL FLUID COLLECTION  CT-GUIDED DRAIN PLACEMENT IN LEFT LATERAL ABDOMINAL FLUID COLLECTION  Physician: Rachelle Hora. Lowella Dandy, MD  FLUOROSCOPY TIME:  None  MEDICATIONS: 4 mg versed, 100 mcg of fentanyl. A radiology nurse monitored the patient for moderate sedation.   ANESTHESIA/SEDATION: Moderate sedation time: 50 min  PROCEDURE: Informed consent was obtained for CT-guided drain placements. Patient was placed supine on the CT scanner. Images through the abdomen were obtained. The collections in the left upper abdomen and lateral left abdomen were identified. The left side of the abdomen was prepped and draped in sterile fashion. The collection in the left upper abdomen and perisplenic region was initially targeted. Skin was anesthetized with 1% lidocaine. 18 gauge needle was directed into the fluid collection just posterior to the spleen and cloudy yellow fluid was aspirated. A stiff Amplatz wire was placed and the tract was dilated to accommodate a 10.2 [place] multipurpose drain. Catheter sutured to skin and attached to a suction bulb.  Attention was directed to the left lateral abdominal fluid collection. The left lateral abdomen was anesthetized with 1% lidocaine. 18 gauge needle directed into the fluid collection and cloudy yellow fluid was aspirated. Stiff Amplatz wire was placed and the tract was dilated to accommodate a 10.2 [place] multipurpose drain. Catheter sutured to skin and attached to a suction bulb.  FINDINGS: Left upper abdominal fluid collection in the perisplenic region. Drain was successfully placed just posterior to the spleen. There is a second collection in the lateral left mid abdomen. Drain successfully placed in this collection.  Estimated blood loss: Minimal  COMPLICATIONS: None  IMPRESSION: Placement of CT-guided drainage catheters in two left abdominal fluid collections. Cloudy yellow fluid was removed from both collections. Samples were sent for culture.   Electronically Signed   By: Richarda Overlie M.D.   On: 04/04/2015 08:32   Ct Image Guided Drainage Percut Cath  Peritoneal Retroperit  04/04/2015   CLINICAL DATA:  81 year old with postoperative fluid  collections. Concern for intra-abdominal abscesses.  EXAM: CT-GUIDED DRAIN PLACEMENT IN LEFT UPPER  ABDOMINAL FLUID COLLECTION  CT-GUIDED DRAIN PLACEMENT IN LEFT LATERAL ABDOMINAL FLUID COLLECTION  Physician: Rachelle Hora. Lowella Dandy, MD  FLUOROSCOPY TIME:  None  MEDICATIONS: 4 mg versed, 100 mcg of fentanyl. A radiology nurse monitored the patient for moderate sedation.  ANESTHESIA/SEDATION: Moderate sedation time: 50 min  PROCEDURE: Informed consent was obtained for CT-guided drain placements. Patient was placed supine on the CT scanner. Images through the abdomen were obtained. The collections in the left upper abdomen and lateral left abdomen were identified. The left side of the abdomen was prepped and draped in sterile fashion. The collection in the left upper abdomen and perisplenic region was initially targeted. Skin was anesthetized with 1% lidocaine. 18 gauge needle was directed into the fluid collection just posterior to the spleen and cloudy yellow fluid was aspirated. A stiff Amplatz wire was placed and the tract was dilated to accommodate a 10.2 [place] multipurpose drain. Catheter sutured to skin and attached to a suction bulb.  Attention was directed to the left lateral abdominal fluid collection. The left lateral abdomen was anesthetized with 1% lidocaine. 18 gauge needle directed into the fluid collection and cloudy yellow fluid was aspirated. Stiff Amplatz wire was placed and the tract was dilated to accommodate a 10.2 [place] multipurpose drain. Catheter sutured to skin and attached to a suction bulb.  FINDINGS: Left upper abdominal fluid collection in the perisplenic region. Drain was successfully placed just posterior to the spleen. There is a second collection in the lateral left mid abdomen. Drain successfully placed in this collection.  Estimated blood loss: Minimal  COMPLICATIONS: None  IMPRESSION: Placement of CT-guided drainage catheters in two left abdominal fluid collections. Cloudy yellow fluid was removed from both collections. Samples were sent for culture.   Electronically Signed   By: Richarda Overlie M.D.   On: 04/04/2015 08:32     Medical Consultants:    Surgery - Dr. Glenna Fellows   Cardiology   PCCM  Neurology - Dr. Thana Farr   ID  Anti-Infectives:    Mefoxin 03/25/2015 --> 03/26/2015  Rocephin 03/26/2015 -->   Flagyl 03/26/2015 -->  Subjective:   [name] DENIES ANY NAUSEA OR VOMITing. Patient reports 6 /10 abdominal pain, but daughter does not want her to get any pain meds except for tylenol.   Objective:    Filed Vitals:   04/10/15 2007 04/11/15 0458 04/11/15 0900 04/11/15 1256  BP: 140/64 177/76 157/78 118/71  Pulse: 94 90 93 108  Temp: 98.7 F (37.1 C) 98 F (36.7 C)  98 F (36.7 C)  TempSrc: Oral Oral  Oral  Resp: 18 18  20   Height:      Weight:      SpO2: 100% 100%  100%    Intake/Output Summary (Last 24 hours) at 04/11/15 1357 Last data filed at 04/11/15 0900  Gross per 24 hour  Intake 3145.66 ml  Output    472 ml  Net 2673.66 ml    Exam: Gen:  NAD Cardiovascular:  RRR, No M/R/G Respiratory:  Lungs CTAB Gastrointestinal:  Abdomen softly distended,  No tenderness, + BS Extremities:  1+ edema   Data Reviewed:    Labs: Basic Metabolic Panel:  Recent Labs Lab 04/05/15 1000  04/07/15 0454 04/08/15 0430 04/09/15 0503 04/10/15 0500 04/11/15 0447  NA 132*  < > 135 133* 133* 134* 135  K 4.5  < > 5.0 4.6 4.4 4.1 3.9  CL 103  < > 104 104 104 106 108  CO2 22  < > 22 21* 20* 20* 19*  GLUCOSE 113*  < > 136* 121* 125* 131* 104*  BUN 46*  < > 46* 43* 41* 40* 42*  CREATININE 0.83  < > 0.82 0.77 0.70 0.71 0.67  CALCIUM 8.3*  < > 8.9 8.9 9.1 9.0 9.2  MG 2.1  --  1.6* 1.9  --  1.5* 1.9  PHOS 4.4  --  4.2  --   --  4.2 4.2  < > = values in this interval not displayed. GFR Estimated Creatinine Clearance: 53.9 mL/min (by C-G formula based on Cr of 0.67). Liver Function Tests:  Recent Labs Lab 04/07/15 0454 04/09/15 0503  AST 43* 39  ALT 32 31  ALKPHOS 204* 199*  BILITOT 0.4 0.4  PROT 7.0 7.2  ALBUMIN  2.0* 1.9*   Coagulation profile No results for input(s): INR, PROTIME in the last 168 hours.  CBC:  Recent Labs Lab 04/06/15 0445 04/07/15 0454 04/08/15 1213 04/09/15 0503 04/11/15 0447  WBC 14.3* 13.9* 11.8* 11.7* 8.1  NEUTROABS  --  12.4*  --   --  6.2  HGB 8.5* 7.8* 8.5* 7.7* 7.7*  HCT 27.4* 25.8* 27.5* 24.9* 24.4*  MCV 82.8 82.4 82.8 82.5 81.1  PLT 530* 506* 605* 538* 618*   Cardiac Enzymes: No results for input(s): CKTOTAL, CKMB, CKMBINDEX, TROPONINI in the last 168 hours. CBG:  Recent Labs Lab 04/10/15 2001 04/11/15 0048 04/11/15 0426 04/11/15 0733 04/11/15 1147  GLUCAP 127* 198* 118* 172* 231*   Sepsis Labs:  Recent Labs Lab 04/07/15 0454 04/08/15 1213 04/09/15 0503 04/11/15 0447  WBC 13.9* 11.8* 11.7* 8.1   Microbiology Recent Results (from the past 240 hour(s))  Culture, routine-abscess     Status: None   Collection Time: 04/03/15  3:18 PM  Result Value Ref Range Status   Specimen Description ABSCESS LEFT ABDOMEN UPPER  Final   Special Requests NONE  Final   Gram Stain   Final    MODERATE WBC PRESENT,BOTH PMN AND MONONUCLEAR NO SQUAMOUS EPITHELIAL CELLS SEEN NO ORGANISMS SEEN Performed at [facility]    Culture   Final    NO GROWTH 3 DAYS Performed at [facility]    Report Status 04/07/2015 FINAL  Final  Culture, routine-abscess     Status: None   Collection Time: 04/03/15  3:18 PM  Result Value Ref Range Status   Specimen Description ABSCESS ABDOMEN MID  Final   Special Requests NONE  Final   Gram Stain   Final    ABUNDANT WBC PRESENT,BOTH PMN AND MONONUCLEAR NO SQUAMOUS EPITHELIAL CELLS SEEN NO ORGANISMS SEEN Performed at [facility]    Culture   Final    FEW ENTEROCOCCUS SPECIES Performed at [facility]    Report Status 04/07/2015 FINAL  Final   Organism ID, Bacteria ENTEROCOCCUS SPECIES  Final      Susceptibility   Enterococcus species - MIC*    VANCOMYCIN 2 SENSITIVE Sensitive      AMPICILLIN <=2 SENSITIVE Sensitive     * FEW ENTEROCOCCUS SPECIES  Culture, Urine     Status: None   Collection Time: 04/06/15  3:27 PM  Result Value Ref Range Status   Specimen Description URINE, CATHETERIZED  Final   Special Requests NONE  Final   Colony Count   Final    >=100,000 COLONIES/ML Performed at [facility]  Final    ENTEROBACTER CLOACAE ENTEROCOCCUS SPECIES Performed at [facility]    Report Status 04/09/2015 FINAL  Final   Organism ID, Bacteria ENTEROBACTER CLOACAE  Final   Organism ID, Bacteria ENTEROCOCCUS SPECIES  Final      Susceptibility   Enterobacter cloacae - MIC*    CEFAZOLIN >=64 RESISTANT Resistant     CEFTRIAXONE >=64 RESISTANT Resistant     CIPROFLOXACIN <=0.25 SENSITIVE Sensitive     GENTAMICIN 2 SENSITIVE Sensitive     LEVOFLOXACIN <=0.12 SENSITIVE Sensitive     NITROFURANTOIN 64 INTERMEDIATE Intermediate     TOBRAMYCIN <=1 SENSITIVE Sensitive     TRIMETH/SULFA <=20 SENSITIVE Sensitive     PIP/TAZO >=128 RESISTANT Resistant     * ENTEROBACTER CLOACAE   Enterococcus species - MIC*    AMPICILLIN <=2 SENSITIVE Sensitive     LEVOFLOXACIN 0.5 SENSITIVE Sensitive     NITROFURANTOIN <=16 SENSITIVE Sensitive     VANCOMYCIN 2 SENSITIVE Sensitive     TETRACYCLINE >=16 RESISTANT Resistant     * ENTEROCOCCUS SPECIES  Culture, blood (routine x 2)     Status: None (Preliminary result)   Collection Time: 04/06/15  4:40 PM  Result Value Ref Range Status   Specimen Description BLOOD RIGHT ARM  Final   Special Requests BOTTLES DRAWN AEROBIC AND ANAEROBIC 10CC  Final   Culture   Final           BLOOD CULTURE RECEIVED NO GROWTH TO DATE CULTURE WILL BE HELD FOR 5 DAYS BEFORE ISSUING A FINAL NEGATIVE REPORT Performed at [facility]    Report Status PENDING  Incomplete  Culture, blood (routine x 2)     Status: None (Preliminary result)   Collection Time: 04/06/15  4:50 PM  Result Value Ref Range Status   Specimen  Description BLOOD RIGHT HAND  Final   Special Requests BOTTLES DRAWN AEROBIC AND ANAEROBIC 10CC  Final   Culture   Final           BLOOD CULTURE RECEIVED NO GROWTH TO DATE CULTURE WILL BE HELD FOR 5 DAYS BEFORE ISSUING A FINAL NEGATIVE REPORT Performed at [facility]    Report Status PENDING  Incomplete     Medications:   . antiseptic oral rinse  7 mL Mouth Rinse q12n4p  . aspirin  300 mg Rectal Daily   Or  . aspirin  325 mg Oral Daily  . bisacodyl  10 mg Rectal Daily  . chlorhexidine  15 mL Mouth Rinse BID  . docusate sodium  100 mg Oral BID  . feeding supplement (ENSURE ENLIVE)  237 mL Oral BID BM  . insulin aspart  0-15 Units Subcutaneous 6 times per day  . lip balm  1 application Topical BID  . metoprolol  2.5 mg Intravenous 4 times per day  . polyethylene glycol  17 g Oral BID   Continuous Infusions: . sodium chloride 10 mL/hr (03/29/15 0800)  . Marland KitchenTPN (CLINIMIX-E) Adult 70 mL/hr at 04/10/15 1801   And  . fat emulsion 240 mL (04/10/15 1802)  . Marland KitchenTPN (CLINIMIX-E) Adult     And  . fat emulsion      Time spent: 25 minutes.   LOS: 24 days   [facility]  [facility] Pager [phone]  If 7PM-7AM, please contact night-coverage at [email or web address], password [email or web address] for any overnight needs.  04/11/2015, 1:57 PM

## 2015-04-12 ENCOUNTER — Inpatient Hospital Stay (HOSPITAL_COMMUNITY): Payer: Commercial Managed Care - HMO

## 2015-04-12 LAB — GLUCOSE, CAPILLARY
GLUCOSE-CAPILLARY: 147 mg/dL — AB (ref 65–99)
GLUCOSE-CAPILLARY: 179 mg/dL — AB (ref 65–99)
Glucose-Capillary: 111 mg/dL — ABNORMAL HIGH (ref 65–99)
Glucose-Capillary: 113 mg/dL — ABNORMAL HIGH (ref 65–99)
Glucose-Capillary: 131 mg/dL — ABNORMAL HIGH (ref 65–99)
Glucose-Capillary: 140 mg/dL — ABNORMAL HIGH (ref 65–99)

## 2015-04-12 LAB — CULTURE, BLOOD (ROUTINE X 2)
CULTURE: NO GROWTH
Culture: NO GROWTH

## 2015-04-12 MED ORDER — OXYCODONE HCL 5 MG PO TABS
2.5000 mg | ORAL_TABLET | Freq: Four times a day (QID) | ORAL | Status: DC | PRN
  Administered 2015-04-14 – 2015-04-19 (×9): 5 mg via ORAL
  Filled 2015-04-12 (×10): qty 1

## 2015-04-12 MED ORDER — FENTANYL CITRATE (PF) 100 MCG/2ML IJ SOLN
25.0000 ug | INTRAMUSCULAR | Status: DC | PRN
  Administered 2015-04-15 – 2015-04-19 (×17): 50 ug via INTRAVENOUS
  Filled 2015-04-12 (×17): qty 2

## 2015-04-12 MED ORDER — FAT EMULSION 20 % IV EMUL
240.0000 mL | INTRAVENOUS | Status: AC
  Administered 2015-04-12: 240 mL via INTRAVENOUS
  Filled 2015-04-12: qty 250

## 2015-04-12 MED ORDER — OXYCODONE HCL 5 MG PO TABS: 5.0000 mg | ORAL_TABLET | ORAL | Status: DC | PRN

## 2015-04-12 MED ORDER — ACETAMINOPHEN 500 MG PO TABS
1000.0000 mg | ORAL_TABLET | Freq: Three times a day (TID) | ORAL | Status: DC
  Administered 2015-04-12 (×2): 1000 mg via ORAL
  Filled 2015-04-12 (×3): qty 2

## 2015-04-12 MED ORDER — SODIUM CHLORIDE 0.9 % IV SOLN: Freq: Once | INTRAVENOUS | Status: DC

## 2015-04-12 MED ORDER — ALTEPLASE 2 MG IJ SOLR
2.0000 mg | Freq: Once | INTRAMUSCULAR | Status: AC
  Administered 2015-04-12: 2 mg
  Filled 2015-04-12: qty 2

## 2015-04-12 MED ORDER — TRACE MINERALS CR-CU-MN-SE-ZN 10-1000-500-60 MCG/ML IV SOLN
INTRAVENOUS | Status: AC
  Administered 2015-04-12: 17:00:00 via INTRAVENOUS
  Filled 2015-04-12: qty 1680

## 2015-04-12 NOTE — Progress Notes (Signed)
Attempted to obtain type & screen from PICC line but no blood return.  Will instill alteplase per protocol and will attempt lab draw after alteplase dwells for 1- 1 1/2 hr

## 2015-04-12 NOTE — Progress Notes (Signed)
Physical Therapy Treatment Patient Details Name: [name] MRN: [id number] DOB: [birth year] Today's Date: 04/12/2015    History of Present Illness 81 year old female with history of hypertension, coronary artery disease, hyperlipidemia, diabetes and pt s/p lap converted to open LOA for SBO on 5/12, complicated by Left sided facial droop / right arm weakness / acute CVA in left lentiform nucleus however neurologist note states reviewed MRI and no acute changes seen    PT Comments    Pt with limited ability to participate today, somewhat lethargic but is arousable; no family present, per dtr yesterday they want to take pt home, really needs SNF  If agreeable  Follow Up Recommendations  SNF;Home health PT;Supervision/Assistance - 24 hour (vs--family wants to take her home-??)     Equipment Recommendations  Hospital bed;Rolling walker with 5" wheels;Wheelchair (measurements PT);Other (comment) (hoyer lift--if home )    Recommendations for Other Services       Precautions / Restrictions Precautions Precautions: Fall Precaution Comments: 2 JP drains Restrictions Weight Bearing Restrictions: No    Mobility  Bed Mobility Overal bed mobility: Needs Assistance Bed Mobility: Supine to Sit   Sidelying to sit: +2 for physical assistance;Total assist       General bed mobility comments: pt sleepy, max multimodal cues. pt positioned on R side when therapy arrived. +2 total for sidelying to EOB with pt not participating today.   Transfers Overall transfer level: Needs assistance Equipment used: Rolling walker (2 wheeled) Transfers: Sit to/from [employer] Sit to Stand: +2 physical assistance;Max assist   Squat pivot transfers: +2 physical assistance;Total assist (pt=0%)     General transfer comment: Stood to assist with peri-area hygiene with pt using walker to stand. Pt could only tolerate brief standing so sat down and then squat pivot transfer to chair. Pt  continues to be lethargic, opens eyes briefly and verbalizes very little today  Ambulation/Gait                 Stairs            Wheelchair Mobility    Modified Rankin (Stroke Patients Only)       Balance Overall balance assessment: Needs assistance Sitting-balance support: No upper extremity supported;Feet supported Sitting balance-Leahy Scale: Poor       Standing balance-Leahy Scale: Zero Standing balance comment: pt requiring significantly more assist today than yesterday                    Cognition Arousal/Alertness: Lethargic Behavior During Therapy: WFL for tasks assessed/performed Overall Cognitive Status: Difficult to assess                      Exercises      General Comments        Pertinent Vitals/Pain Pain Assessment: Faces Faces Pain Scale: Hurts whole lot Pain Location: "rectum"/ moans when LEs moved Pain Descriptors / Indicators: Grimacing;Guarding;Moaning Pain Intervention(s): Limited activity within patient's tolerance;Monitored during session;Repositioned    Home Living                      Prior Function            PT Goals (current goals can now be found in the care plan section) Acute Rehab PT Goals Patient Stated Goal: none stated Time For Goal Achievement: 04/19/15 Potential to Achieve Goals: Fair Progress towards PT goals: Not progressing toward goals - comment (requiring more assist today)  Frequency  Min 3X/week    PT Plan Current plan remains appropriate;Discharge plan needs to be updated (??)    Co-evaluation PT/OT/SLP Co-Evaluation/Treatment: Yes Reason for Co-Treatment: For patient/therapist safety PT goals addressed during session: Mobility/safety with mobility OT goals addressed during session: ADL's and self-care;Proper use of Adaptive equipment and DME     End of Session Equipment Utilized During Treatment: Gait belt Activity Tolerance: Patient limited by fatigue;Patient  limited by lethargy;Patient limited by pain Patient left: in chair;with call bell/phone within reach     Time: 0931-0959 PT Time Calculation (min) (ACUTE ONLY): 28 min  Charges:  $Therapeutic Activity: 8-22 mins                    G Codes:      Alinna Siple April 20, 2015, 12:07 PM

## 2015-04-12 NOTE — Progress Notes (Addendum)
PARENTERAL NUTRITION CONSULT NOTE - follow up  Pharmacy Consult for TPN Indication: bowel obstruction  Allergies  Allergen Reactions  . Amlodipine Besylate     REACTION: dizzy  . Aspirin   . Atenolol     REACTION: fatigue  . Benazepril     cough  . Benicar [Olmesartan Medoxomil]     HA  . Cozaar     nausea  . Hydrochlorothiazide W-Triamterene     REACTION: dizzy  . Hydrocodone     REACTION: HA  . Hydroxyzine Pamoate   . Iodine   . Lisinopril     REACTION: tired, cough  . Penicillins Itching    tolerates cephalosporins OK  . Pravastatin     myalgias  . Prednisolone     My stomach hurts  . Tramadol Hcl     REACTION: HA    Patient Measurements: Height: _0  (162.6 cm) Weight: 149 lb 4 oz (67.7 kg) IBW/kg (Calculated) : 54.7 Adjusted Body Weight: 57.5kg   Vital Signs: Temp: 98.4 F (36.9 C) (05/31 0411) Temp Source: Oral (05/31 0411) BP: 156/74 mmHg (05/31 1610) Pulse Rate: 92 (05/31 0411) Intake/Output from previous day: 05/30 0701 - 05/31 0700 In: 2646.7 [P.O.:360; I.V.:250; TPN:2006.7] Out: 75 [Drains:75] Intake/Output from this shift:    Labs:  Recent Labs  04/11/15 0447  WBC 8.1  HGB 7.7*  HCT 24.4*  PLT 618*     Recent Labs  04/10/15 0500 04/11/15 0447  NA 134* 135  K 4.1 3.9  CL 106 108  CO2 20* 19*  GLUCOSE 131* 104*  BUN 40* 42*  CREATININE 0.71 0.67  CALCIUM 9.0 9.2  MG 1.5* 1.9  PHOS 4.2 4.2  PREALBUMIN  --  15.1*  TRIG  --  88   Estimated Creatinine Clearance: 53.9 mL/min (by C-G formula based on Cr of 0.67).    Recent Labs  04/12/15 0051 04/12/15 0409 04/12/15 0725  GLUCAP 140* 147* 113*    Insulin Requirements: 13 units SSI on 5/30  Current Nutrition:  Dysphagia 3 diet (start 5/20) - 10-25% meals charted as eaten Ensure BID ordered on 5/20 (took one of two cans yesterday)  IVF: NS @ 10 ml/hr  Central access: PICC 5/11 TPN start date: 5/11  ASSESSMENT                                                                                                           HPI: 81 year old female with history of hypertension, coronary artery disease, hyperlipidemia, iron deficiency anemia, depression, osteoporosis, mild chronic kidney disease presented to the ED with ongoing nausea for 4 days and diffuse crampy lower abdominal pain since one day prior to admission. Associated poor by mouth intake and generalized weakness. Patient was found to have small bowel obstruction.  Pharmacy consulted to start TPN.  Significant events:  5/12: laparoscopic lysis of adhesions with laparotomy and decompression of bowel 5/13: new AKI 5/19: Remove NGT 5/20 Completed swallow eval - starting dysphagia diet 5/21: TPN changed to electrolytes free d/t high K+; given kayexalate.  Ate about  10% of lunch 5/22: IR drainage of 2 abd abscesses, drains placed.  TPN to 50 ml/hr 5/24: Thrush noted.  TPN to 40 ml/hr; Surgery wanting to stimulate appetite. 5/27: Minor improvements in appetite; surgery considering PEG if not improving soon 5/29: per CT improvement in fluid collections and drainage is serous - CCS hopeful to remove drains later this week if improvement continues. Lytes added back to TPN and rate titrated back to goal    Today:   Glucose (goal <150) - CBGs at goal last 24h - range 104-198  Electrolytes - All lytes wnl except CorrCa slightly high  Renal -  SCr improved to wnl; CrCl 54  LFTs - Alk phos slightly elevated but stable.  Others wnl (5/23)  TGs - wnl (5/23)  Prealbumin: continues to improve 2.7 > 15.1 (5/30) despite reduction in TPN rate  NUTRITIONAL GOALS                                                                                             RD recs (5/18): 75-85 g/day protein, 1500-1700 Kcal/day. Clinimix E 5/15 at a goal rate of 28m/hr + 20% fat emulsion at 165mhr to provide: 84 g/day protein, 1672 Kcal/day.  PLAN                                                                                                                          At 1800 today:  Continue Clinimix E 5/15 to goal rate of 70 ml/hr  Continue 20% fat emulsion at 1045mr.   TPN to contain standard multivitamins and trace elements.  [place] [facility] [facility] q4h  BMP tomorrow AM  TPN lab panels on Mondays & Thursdays.  F/u Surgery plans for continued TPN vs PEG placement  DreReuel BoomharmD Pager: [phone]/2016, 8:58 AM

## 2015-04-12 NOTE — Progress Notes (Signed)
Patient ID: [name], female   DOB: 1933/04/19, 81 y.o.   MRN: [id number]    Referring Physician(s): CCS/TRH   Subjective: Pt not as engaging in conversation today; still with some abd/rectal pain; poor appetite  Allergies: Amlodipine besylate; Aspirin; Atenolol; Benazepril; Benicar; Cozaar; Hydrochlorothiazide w-triamterene; Hydrocodone; Hydroxyzine pamoate; Iodine; Lisinopril; Penicillins; Pravastatin; Prednisolone; and Tramadol hcl  Medications: Prior to Admission medications   Medication Sig Start Date End Date Taking? Authorizing Provider  acetaminophen (TYLENOL) 650 MG CR tablet Take 1,300 mg by mouth every 8 (eight) hours as needed for pain.    Yes Historical Provider, MD  aspirin 325 MG tablet Take 325 mg by mouth daily.   Yes Historical Provider, MD  cholecalciferol (VITAMIN D) 1000 UNITS tablet Take 2 tablets (2,000 Units total) by mouth daily. 04/17/13  Yes Aleksei Plotnikov V, MD  Cyanocobalamin (VITAMIN B-12) 1000 MCG SUBL Place 1 tablet (1,000 mcg total) under the tongue daily. 12/03/13  Yes Aleksei Plotnikov V, MD  furosemide (LASIX) 20 MG tablet Take 1 tablet (20 mg total) by mouth daily as needed for edema. Patient taking differently: Take 20 mg by mouth daily.  03/10/15 03/09/16 Yes Aleksei Plotnikov V, MD  metFORMIN (GLUCOPHAGE) 500 MG tablet TAKE 1 TABLET TWICE DAILY  WITH  A  MEAL 08/23/14  Yes Aleksei Plotnikov V, MD  OVER THE COUNTER MEDICATION Apply 1 application topically 2 (two) times daily as needed (dryness). Diabetic Lotion.   Yes Historical Provider, MD  oxybutynin (DITROPAN) 5 MG tablet Take 1 tablet (5 mg total) by mouth 2 (two) times daily. 11/25/14  Yes Aleksei Plotnikov V, MD  Polyethyl Glycol-Propyl Glycol (SYSTANE OP) Apply 1-2 drops to eye daily as needed (dry eyes).   Yes Historical Provider, MD  LORazepam (ATIVAN) 1 MG tablet Take 0.5 tablets (0.5 mg total) by mouth every 8 (eight) hours as needed for anxiety. Patient not taking: Reported on 03/18/2015  05/07/13   Bethann Berkshire, MD     Vital Signs: BP 156/74 mmHg  Pulse 92  Temp(Src) 98.4 F (36.9 C) (Oral)  Resp 18  Ht 5\' 4"  (1.626 m)  Wt 149 lb 4 oz (67.7 kg)  BMI 25.61 kg/m2  SpO2 100%  Physical Examleft lat abd drains intact, insertion sites ok, mildly tender, outputs 55 cc's (LUQ), 20 cc's left lat abd drain turbid, beige colored fluid  Imaging: No results found.  Labs:  CBC:  Recent Labs  04/07/15 0454 04/08/15 1213 04/09/15 0503 04/11/15 0447  WBC 13.9* 11.8* 11.7* 8.1  HGB 7.8* 8.5* 7.7* 7.7*  HCT 25.8* 27.5* 24.9* 24.4*  PLT 506* 605* 538* 618*    COAGS:  Recent Labs  03/27/15 2348 04/03/15 1140  INR 1.06 1.22  APTT 40*  --     BMP:  Recent Labs  04/08/15 0430 04/09/15 0503 04/10/15 0500 04/11/15 0447  NA 133* 133* 134* 135  K 4.6 4.4 4.1 3.9  CL 104 104 106 108  CO2 21* 20* 20* 19*  GLUCOSE 121* 125* 131* 104*  BUN 43* 41* 40* 42*  CALCIUM 8.9 9.1 9.0 9.2  CREATININE 0.77 0.70 0.71 0.67  GFRNONAA >60 >60 >60 >60  GFRAA >60 >60 >60 >60    LIVER FUNCTION TESTS:  Recent Labs  03/31/15 0350 04/04/15 0650 04/07/15 0454 04/09/15 0503  BILITOT 0.4 0.5 0.4 0.4  AST 26 45* 43* 39  ALT 14 26 32 31  ALKPHOS 98 139* 204* 199*  PROT 5.9* 6.8 7.0 7.2  ALBUMIN 1.8* 1.9* 2.0*  1.9*    Assessment and Plan: S/p LOA for SBO 5/13, drainage of left upper/lateral abd fluid collections 5/22; afebrile; fluid cx's- enterococcus, urine cx- enterobacter, enterococcus- plans as per ID; latest CT 5/27 shows improved but unresolved left abd fluid collections- cont drain irrigation and check CT later this week as per CCS to re- eval/possibly pull drains   Signed: D. Jeananne Rama 04/12/2015, 9:42 AM   I spent a total of 15 minutes in face to face in clinical consultation/evaluation, greater than 50% of which was counseling/coordinating care for left abd abscess drains

## 2015-04-12 NOTE — Progress Notes (Signed)
Progress Note   [name] FWY:[id number] DOB: 11/10/1933 DOA: 03/18/2015 PCP: [name], MD   Brief Narrative:   [name] is an 81 y.o. female with a PMH of hypertension, CAD, hyperlipidemia and diabetes who was admitted 03/18/15 with SBO secondary to adhesions. She failed conservative therapy and underwent laparoscopic lysis of adhesions/laparotomy with bowel decompression on 03/24/15. Her hospital course was complicated by sepsis secondary to peritoneal contamination with transfer to the SDU on 03/25/15. She also developed left-sided facial droop/right arm weakness 03/27/15 with CT scan showing a 4 mm lacunar infarct in the left lentiform nucleus. MRI failed to show any abnormality. Due to failure to improve an ongoing high need for pain control, a repeat CT scan was done 04/02/15 which showed 2 large fluid collections. She underwent percutaneous drainage with placement of abscess drains by IR 04/04/15. Patient has fever on 5/25, but so far all cultures are negative and repeat CXR shows worsening atelectasis and no pneumonia. Repeat CT today shows improvement in the abscess. The abscess culture grew enterococcus and vancomycin started. ID consulted for recommendations. Since she has been afebrile for over 48 hours, recommended to stop the vancomycin. .   Assessment/Plan:   Principal Problem:   Small bowel obstruction s/p exlap/LOA/decompression 03/25/2015 complicated by severe sepsis secondary to peritonitis / abdominal pain now complicated by intra-abdominal abscess/fluid collection - Status post laparoscopic lysis of adhesions/laparotomy with bowel decompression 03/24/15. - Subsequently developed acute peritonitis/sepsis. - Initially treated with Mefoxin starting 03/25/15. Antibiotics switched to Flagyl/Rocephin 03/26/15.   - CT scan of the abdomen/pelvis done 04/02/15 which showed 2 large fluid collections.  - 2 percutaneous drains placed by IR 04/03/15 to drain fluid collections.    -Repeat CT today shows improvement in the abscess. The abscess culture grew enterococcus and vancomycin started. ID consulted for recommendations. Discontinued vancomycin today as she remained afebrile and she is improving.  - afebrile and leukcytosis is improving.  - plan to repeat CT on Thursday as per surgery.   Active Problems:   Facial droop / right arm weakness / possible acute CVA left lentiform nucleus - On 03/27/15, CT of the head done to evaluate symptoms showed an acute 4 mm lacunar infarct in the left lentiform nucleus. - Subsequent MRI negative, although clinical findings consistent with acute CVA. MRA negative for stenosis. - 2-D echo with normal EF, no diastolic dysfunction noted. No intracardiac masses or thrombi. - Carotid Dopplers negative for significant stenosis (1-39% bilaterally). - Hemoglobin A1c 6.3%. Cholesterol 99. - Evaluated by neurology, no further stroke workup recommended. - Continue aspirin. - PT/OT.    Hypokalemia/hypomagnesemia - Monitor and replace electrolytes as needed.    Acute postoperative blood loss anemia - Status post 2 units of PRBCs 04/01/15. - Continue to monitor hemoglobin and transfuse 1 unit of prbc today.     Diabetes type 2, controlled - Currently being managed with moderate scale SSI, change to AC HS.  CBGs well controlled.  - Hemoglobin A1c 6.3%. CBG (last 3)   Recent Labs  04/12/15 0409 04/12/15 0725 04/12/15 1133  GLUCAP 147* 113* 179*        Anxiety state/Adjustment disorder with mixed anxiety and depressed mood - Mood has been depressed with limited motivation to participate in PT.    Coronary atherosclerosis - Continue aspirin therapy.    Acute renal failure secondary to sepsis  - Resolved with IV fluids.    Right foot pain  - Radiographs negative for fracture. Degenerative changes  noted.    Polymyalgia rheumatica/Chronic fatigue disorder - PT/OT.    Elevated blood pressure - Continue as needed  Lopressor.    Malnutrition of moderate degree - Continue TPN for nutritional support. Begin to wean.    Acute delirium - Avoid sedating medications. Family requests patient does not get Ativan or Haldol or percocet or morphine.    Thrombocytopenia - Mild. Likely related to recent sepsis.  Constipation: Increased her miralax, pt requesting for enema. Improved. One bm yesterday after enema.   Poor appetite and failure to thrive: She might need a G tube as there is none to minimal po intake.     DVT Prophylaxis - Continue SCDs.  Code Status: Full. Family Communication: none  present today.   Disposition Plan: Home with daughter when diet advanced,.   IV Access:    PICC placed 03/23/15   Procedures and diagnostic studies:   Dg Chest 2 View  04/06/2015   CLINICAL DATA:  Fever  EXAM: CHEST  2 VIEW  COMPARISON:  03/27/2015  FINDINGS: Cardiac shadow is mildly enlarged. Left basilar atelectasis is again identified. Two recently placed drainage catheters are noted in the left upper abdomen. Mild right basilar atelectasis is noted. A left-sided PICC line is noted with the tip at the junction of the innominate veins. This has withdrawn slightly in the interval from the prior exam. No acute bony abnormality is seen.  IMPRESSION: Bibasilar atelectasis. This has increased in the interval from the prior exam.  Left upper quadrant drainage catheters.   Electronically Signed   By: Alcide Clever M.D.   On: 04/06/2015 14:57   Ct Head Wo Contrast  03/27/2015   CLINICAL DATA:  RIGHT-sided facial droop. Symptoms began earlier today. Altered mental status.  EXAM: CT HEAD WITHOUT CONTRAST  TECHNIQUE: Contiguous axial images were obtained from the base of the skull through the vertex without intravenous contrast.  COMPARISON:  05/27/2014.  FINDINGS: Possible acute 4 mm lacune, LEFT lentiform nucleus as seen on image 12 series 5. This was not clearly present on in 2015 scan.  No other areas of concern for  cortical or subcortical infarction.  No hemorrhage, mass lesion, hydrocephalus, or extra-axial fluid.  Cerebral and cerebellar atrophy. Generalized white matter hypoattenuation, likely small vessel disease. Than the calvarium intact. Vascular calcification. No sinus or mastoid disease. Dense lenticular opacities.  IMPRESSION: Possible acute 4 mm lacunar infarct LEFT lentiform nucleus. If further investigation desired, and no contraindications, consider MRI brain.   Electronically Signed   By: Davonna Belling M.D.   On: 03/27/2015 16:25   Mr Maxine Glenn Head Wo Contrast  03/28/2015   CLINICAL DATA:  81 year old with right-sided facial droop. Altered mental status. Subsequent encounter.  EXAM: MRI HEAD WITHOUT CONTRAST  MRA HEAD WITHOUT CONTRAST  TECHNIQUE: Multiplanar, multiecho pulse sequences of the brain and surrounding structures were obtained without intravenous contrast. Angiographic images of the head were obtained using MRA technique without contrast.  COMPARISON:  03/27/2015 head CT.  FINDINGS: MRI HEAD FINDINGS  No acute infarct.  No intracranial hemorrhage.  Remote tiny infarct right cerebellum. Minimal small vessel disease type changes.  Incidentally noted is a prominent peri vascular space left lenticular nucleus.  Global atrophy without hydrocephalus.  No intracranial mass lesion noted on this unenhanced exam.  Cervical medullary junction unremarkable. Mild spinal stenosis C3-4.  Expanded partially empty sella without other findings of pseudotumor cerebri  Pineal region unremarkable.  No intracranial mass lesion noted on this unenhanced exam.  MRA HEAD FINDINGS  Mild narrowing pre cavernous/ cavernous segment right internal carotid artery.  Mild ectasia left internal carotid artery cavernous segment. The minimal bulge along the left internal carotid artery cavernous segment most likely related to slight ectasia of the left ophthalmic artery origin rather than small aneurysm. Mild motion degradation limits  evaluation.  Slightly ectatic anterior communicating artery without aneurysm.  Middle cerebral artery mild branch vessel irregularity bilaterally.  No significant stenosis distal vertebral arteries or basilar artery. Regions of mild irregularity and slight narrowing.  IMPRESSION: MRI HEAD  No acute infarct.  Remote tiny infarct right cerebellum.  Incidentally noted is a prominent peri vascular space left lenticular nucleus.  Global atrophy without hydrocephalus.  Expanded partially empty sella without other findings of pseudotumor cerebri  MRA HEAD FINDINGS  No medium or large size vessel significant stenosis or occlusion. Evaluation of branch vessels slightly limited by motion degradation.   Electronically Signed   By: Lacy Duverney M.D.   On: 03/28/2015 12:02   Mr Brain Wo Contrast  03/28/2015   CLINICAL DATA:  81 year old with right-sided facial droop. Altered mental status. Subsequent encounter.  EXAM: MRI HEAD WITHOUT CONTRAST  MRA HEAD WITHOUT CONTRAST  TECHNIQUE: Multiplanar, multiecho pulse sequences of the brain and surrounding structures were obtained without intravenous contrast. Angiographic images of the head were obtained using MRA technique without contrast.  COMPARISON:  03/27/2015 head CT.  FINDINGS: MRI HEAD FINDINGS  No acute infarct.  No intracranial hemorrhage.  Remote tiny infarct right cerebellum. Minimal small vessel disease type changes.  Incidentally noted is a prominent peri vascular space left lenticular nucleus.  Global atrophy without hydrocephalus.  No intracranial mass lesion noted on this unenhanced exam.  Cervical medullary junction unremarkable. Mild spinal stenosis C3-4.  Expanded partially empty sella without other findings of pseudotumor cerebri  Pineal region unremarkable.  No intracranial mass lesion noted on this unenhanced exam.  MRA HEAD FINDINGS  Mild narrowing pre cavernous/ cavernous segment right internal carotid artery.  Mild ectasia left internal carotid artery  cavernous segment. The minimal bulge along the left internal carotid artery cavernous segment most likely related to slight ectasia of the left ophthalmic artery origin rather than small aneurysm. Mild motion degradation limits evaluation.  Slightly ectatic anterior communicating artery without aneurysm.  Middle cerebral artery mild branch vessel irregularity bilaterally.  No significant stenosis distal vertebral arteries or basilar artery. Regions of mild irregularity and slight narrowing.  IMPRESSION: MRI HEAD  No acute infarct.  Remote tiny infarct right cerebellum.  Incidentally noted is a prominent peri vascular space left lenticular nucleus.  Global atrophy without hydrocephalus.  Expanded partially empty sella without other findings of pseudotumor cerebri  MRA HEAD FINDINGS  No medium or large size vessel significant stenosis or occlusion. Evaluation of branch vessels slightly limited by motion degradation.   Electronically Signed   By: Lacy Duverney M.D.   On: 03/28/2015 12:02   Ct Abdomen Pelvis W Contrast  04/08/2015   CLINICAL DATA:  SOB, Abscess drains 04/04/2015. Surg: TAH, Gb, tubal, laparoscopy adhesion. Hx of aortic thoracic aneurysm. Elev WBC's, fever.  EXAM: CT ABDOMEN AND PELVIS WITH CONTRAST  TECHNIQUE: Multidetector CT imaging of the abdomen and pelvis was performed using the standard protocol following bolus administration of intravenous contrast.  CONTRAST:  OMNIPAQUE IOHEXOL 300 MG/ML  SOLN  COMPARISON:  04/03/2015 and earlier studies  FINDINGS: Small left pleural effusion. Consolidation/atelectasis with air bronchograms in the posterior and medial basal segments left lower lobe, and patchy airspace disease posteriorly  at the right lung base. Coronary calcifications.  Stable percutaneous drain from intercostal approach posterior to the spleen, with some decrease in the subcapsular/subphrenic collection, with moderate residual. Catheter is at the dependent posterior aspect of the  residual component.  Stable left mid abdominal percutaneous pigtail drain from an intercostal approach, with decrease in size of thick-walled complex fluid collection, residual 2.1 x 4.3 cm. Catheter appears well positioned centrally within the residual.  No new intra-abdominal collections. Surgical clips in the gallbladder fossa. Stable mild central intrahepatic biliary ductal dilatation and dilatation of the common duct. No focal liver lesion. Unremarkable spleen, adrenal glands, pancreas, kidneys. No hydronephrosis. Patchy aortoiliac calcifications without aneurysm or stenosis. Portal vein patent. Stomach, small bowel, and colon are nondilated. Multiple diverticula from the sigmoid colon without adjacent inflammatory/edematous change. The urinary bladder is incompletely distended. Bilateral pelvic phleboliths. Old healed pubic fractures. Spondylitic changes in the mid and lower lumbar spine, with mild dextroscoliosis.  IMPRESSION: 1. Some interval improvement in subphrenic and left mid abdomen abscess collections post percutaneous drain placement x2. Both have residual undrained components but the drain catheters appear well positioned. 2. Persistent small left pleural effusion and bibasilar airspace disease left greater than right. 3. Atherosclerosis, including aortoiliac and coronary artery disease. Please note that although the presence of coronary artery calcium documents the presence of coronary artery disease, the severity of this disease and any potential stenosis cannot be assessed on this non-gated CT examination. Assessment for potential risk factor modification, dietary therapy or pharmacologic therapy may be warranted, if clinically indicated.   Electronically Signed   By: Corlis Leak M.D.   On: 04/08/2015 09:49   Ct Abdomen Pelvis W Contrast  04/02/2015   CLINICAL DATA:  Nonspecific abdominal pain. Status post recent laparotomy for small bowel obstruction.  EXAM: CT ABDOMEN AND PELVIS WITH CONTRAST   TECHNIQUE: Multidetector CT imaging of the abdomen and pelvis was performed using the standard protocol following bolus administration of intravenous contrast.  CONTRAST:  50mL OMNIPAQUE IOHEXOL 300 MG/ML SOLN, OMNIPAQUE IOHEXOL 300 MG/ML SOLN  COMPARISON:  Renal ultrasound dated 03/25/2015 and abdomen and pelvis CT dated 03/18/2015.  FINDINGS: Cholecystectomy clips. Interval midline surgical scar in the skin clips at the level of the pelvis. Loculated fluid with peripheral rim enhancement beneath the left hemidiaphragm and partially surrounding the spleen. This measures 7.7 x 5.3 cm on axial image 16. This measures 7.7 cm in length on coronal image number 92.  There is an additional fluid collection in the lateral aspect of the left mid abdomen, measuring 8.3 x 4.2 cm on axial image number 31. This also has peripheral rim enhancement. This measures 9.8 cm in length on coronal image number 64.  There are dilated small bowel loops in the lower abdomen and upper pelvis. Above the urinary bladder, one of these loops contains fluid, small amount of oral contrast and some gas. No definite separate fluid collection is seen.  A Foley catheter is in the urinary bladder with associated air in the bladder. No free peritoneal air seen. Atheromatous arterial calcifications, including the coronary arteries. Cholecystectomy clips.  Small left pleural effusion. Bilateral lower lobe atelectasis. Multiple colonic diverticula. Small left inguinal hernia containing fat. Diffuse subcutaneous edema. Lumbar and lower thoracic spine degenerative changes and scoliosis.  IMPRESSION: 1. 7.7 x 7.7 x 5.3 cm fluid collection with rim enhancement beneath the left hemidiaphragm and partially surrounding the spleen. This is suspicious for an abscess. 2. 9.8 x 8.3 x 4.2 cm arm left  mid abdominal fluid collection with peripheral rim enhancement, suspicious for an abscess. 3. Focal ileus or partial obstruction involving small bowel in the  inferior abdomen and upper pelvis. 4. Small left pleural effusion. 5. Bilateral lower lobe atelectasis. 6. Colonic diverticulosis.   Electronically Signed   By: Beckie Salts M.D.   On: 04/02/2015 17:38   Ct Abdomen Pelvis W Contrast  03/18/2015   CLINICAL DATA:  Nausea and vomiting for 4 days, anxiety, status post hysterectomy CT pelvis 05/27/2014  EXAM: CT ABDOMEN AND PELVIS WITH CONTRAST  TECHNIQUE: Multidetector CT imaging of the abdomen and pelvis was performed using the standard protocol following bolus administration of intravenous contrast.  CONTRAST:  69mL OMNIPAQUE IOHEXOL 300 MG/ML SOLN, OMNIPAQUE IOHEXOL 300 MG/ML SOLN  COMPARISON:  None.  FINDINGS: Sagittal images of the spine shows diffuse osteopenia. Degenerative changes thoracolumbar spine. The lung bases are unremarkable.  The patient is status post cholecystectomy. Mild intrahepatic biliary ductal dilatation. There is CBD dilatation up to 1.1 cm. No focal hepatic mass. The pancreas, spleen and adrenal glands are unremarkable. Kidneys are symmetrical in size and enhancement. No hydronephrosis or hydroureter. Delayed renal images shows bilateral renal symmetrical excretion.  Atherosclerotic calcifications of abdominal aorta and iliac arteries. Moderate stool noted in right colon and proximal transverse colon. No colonic obstruction. The descending colon and sigmoid colon are empty collapsed. Some stool noted within rectum.  There are fluid distended small bowel loops in mid upper abdomen and left lower abdomen and pelvis. Small amount of fluid/stranding noted a adjacent to small bowel loops in left lower quadrant see axial image 57. Distal small bowel is decreased caliber collapsed. Findings are consistent with small bowel obstruction.  Mild distended urinary bladder. The patient is status post hysterectomy. The terminal ileum is small caliber decompressed. Contrast material is noted within stomach. No any contrast material is noted within  small bowel or colon.  IMPRESSION: 1. There are fluid distended small bowel loops with multiple air-fluid levels highly suspicious for small bowel obstruction. Distal small bowel is small caliber decompressed. Terminal ileum is small caliber. Small amount of fluid/stranding noted adjacent to small bowel loops in left lower quadrant please see images 49 and 57. 2. Moderate stool noted in right colon and proximal transverse colon. Descending colon and sigmoid colon are empty collapsed. 3. No pericecal inflammation. 4. No hydronephrosis or hydroureter. 5. Status post cholecystectomy. Mild intrahepatic biliary ductal dilatation. CBD dilatation up to 1.1 cm. 6. No hydronephrosis or hydroureter. 7. Status post hysterectomy. These results were called by telephone at the time of interpretation on 03/18/2015 at 8:59 am to Dr. Gerhard Munch , who verbally acknowledged these results.   Electronically Signed   By: Natasha Mead M.D.   On: 03/18/2015 08:59   US Renal  03/25/2015   CLINICAL DATA:  Acute renal failure.  EXAM: RENAL / URINARY TRACT ULTRASOUND COMPLETE  COMPARISON:  None.  FINDINGS: Right Kidney:  Length: 8.1 cm. Increased echogenicity consistent with chronic medical renal disease. No mass or hydronephrosis visualized.  Left Kidney:  Length: 9.3 cm. Increased echogenicity consistent chronic medical renal disease. No mass or hydronephrosis visualized.  Bladder:  Bladder decompressed by Foley catheter.  IMPRESSION: Bilateral echodense kidneys consistent chronic medical renal disease. No acute abnormality. No hydronephrosis or bladder distention.   Electronically Signed   By: Maisie Fus  Register   On: 03/25/2015 13:27   Dg Chest Port 1 View  03/27/2015   CLINICAL DATA:  Left PICC placement. Nausea and vomiting.  Initial encounter.  EXAM: PORTABLE CHEST - 1 VIEW  COMPARISON:  Chest radiograph performed 03/26/2015  FINDINGS: The patient's left PICC is noted ending about the proximal SVC. An enteric tube is noted extending  below the diaphragm.  The lungs are hypoexpanded. Bibasilar and right upper lung zone airspace opacities may reflect atelectasis or pneumonia. No pleural effusion or pneumothorax is seen  The cardiomediastinal silhouette is borderline normal in size. No acute osseous abnormalities are identified.  IMPRESSION: 1. Left PICC noted ending about the proximal SVC. 2. Lungs hypoexpanded. Bibasilar and right upper lung zone airspace opacities may reflect atelectasis or pneumonia.  These results were called by telephone at the time of interpretation on 03/27/2015 at 7:33 pm to Nursing in the [facility], who verbally acknowledged these results.   Electronically Signed   By: Roanna Raider M.D.   On: 03/27/2015 19:34   Dg Chest Port 1 View  03/26/2015   CLINICAL DATA:  Atelectasis  EXAM: PORTABLE CHEST - 1 VIEW  COMPARISON:  Yesterday  FINDINGS: NG tube and left PICC are stable. Upper normal heart size. Low volumes. Bibasilar atelectasis is not significantly changed. No pneumothorax.  IMPRESSION: Stable bibasilar atelectasis.   Electronically Signed   By: Jolaine Click M.D.   On: 03/26/2015 08:14   Dg Chest Port 1 View  03/25/2015   CLINICAL DATA:  Tachycardia and hypotension; concern for sepsis  EXAM: PORTABLE CHEST - 1 VIEW  COMPARISON:  May 27, 2014  FINDINGS: There is patchy atelectasis in both lung bases. The degree of inspiration is shallow. There is no frank airspace consolidation. Heart is upper normal in size with pulmonary vascularity within normal limits.  Central catheter tip is in the superior vena cava. Nasogastric tube tip and side port are in the distal stomach. No pneumothorax.  IMPRESSION: Bibasilar atelectasis. No pneumothorax. Degree of inspiration shallow.   Electronically Signed   By: Bretta Bang III M.D.   On: 03/25/2015 09:03   Dg Abd Portable 1v  03/24/2015   CLINICAL DATA:  Small-bowel obstruction  EXAM: PORTABLE ABDOMEN - 1 VIEW  COMPARISON:  03/23/2015  FINDINGS: NG tube  is been placed with the tip in the gastric antrum  Small bowel dilatation shows mild interval improvement. Colon is decompressed with moderate stool in the right colon. Surgical clips in the gallbladder fossa.  IMPRESSION: Mild improvement in small bowel obstruction. NG tube in the gastric antrum.   Electronically Signed   By: Marlan Palau M.D.   On: 03/24/2015 08:25   Dg Abd Portable 1v  03/23/2015   CLINICAL DATA:  Followup small bowel obstruction.  EXAM: PORTABLE ABDOMEN - 1 VIEW  COMPARISON:  03/22/2015  FINDINGS: Dilated loops small bowel are similar to the prior exam allowing for differences in radiographic technique different degrees of magnification.  There is a small amount of air in a nondistended colon.  IMPRESSION: 1. Persistent high-grade partial small bowel obstruction. No significant change from the previous day's study.   Electronically Signed   By: Amie Portland M.D.   On: 03/23/2015 10:01   Dg Abd Portable 1v  03/22/2015   CLINICAL DATA:  81 year old female with recent small bowel obstruction. Initial encounter.  EXAM: PORTABLE ABDOMEN - 1 VIEW  COMPARISON:  03/21/2015 and earlier, including CT Abdomen and Pelvis 03/18/2015.  FINDINGS: Portable AP supine view at 0517 hrs. Enteric tube has been removed. Recurrent dilated gas-filled small bowel loops in the mid abdomen up to 41 mm diameter. Abundant  stool now at the splenic flexure. Decreased distal colon gas in the pelvis. Stable cholecystectomy clips. Stable abdominal and pelvic visceral contours. Grossly stable lung bases. Stable visualized osseous structures.  IMPRESSION: NG tube removed with recurrent small bowel obstruction.   Electronically Signed   By: Odessa Fleming M.D.   On: 03/22/2015 07:42   Dg Abd Portable 1v  03/21/2015   CLINICAL DATA:  Small-bowel obstruction .  EXAM: PORTABLE ABDOMEN - 1 VIEW  COMPARISON:  03/20/2015 .  FINDINGS: NG tube noted with tip in the upper portion stomach. Slight advancement should be considered .  Surgical clips right upper quadrant Soft tissue structures are unremarkable. Small-bowel distention has improved. No free air. Stool noted throughout the colon. No acute bony abnormality.  IMPRESSION: 1. NG tube noted with tip in the upper portion of the stomach. Advancement suggested.  2. Interim improvement of small bowel distention. Colonic gas pattern is normal with stool throughout the colon.  These results will be called to the ordering clinician or representative by the Radiologist Assistant, and communication documented in the PACS or zVision Dashboard.   Electronically Signed   By: Maisie Fus  Register   On: 03/21/2015 07:32   Dg Abd Portable 1v  03/20/2015   CLINICAL DATA:  Followup small bowel obstruction.  EXAM: PORTABLE ABDOMEN - 1 VIEW  COMPARISON:  03/19/2015  FINDINGS: Mild small bowel dilation persists most evident in the left upper quadrant. There has been no substantial change from the prior study. Residual contrast is noted in the bladder, also similar to the prior exam. Nasogastric tube tip projects in the distal stomach.  IMPRESSION: Small bowel dilation persists consistent with a persistent partial small bowel obstruction. No change from the previous day's study.   Electronically Signed   By: Amie Portland M.D.   On: 03/20/2015 07:29   Dg Abd Portable 1v  03/19/2015   CLINICAL DATA:  Small bowel obstruction protocol. Small bowel obstruction.  EXAM: PORTABLE ABDOMEN - 1 VIEW  COMPARISON:  03/18/2015.  FINDINGS: This is a 24 hr film. There is no contrast identified in the cecum. On the prior exam at 2046 hr yesterday, oral contrast was present in the stomach. Excreted contrast is present within the urinary bladder. Dilation of small bowel loops measures 36 mm. Cholecystectomy clips are present in the right upper quadrant.  There is less small bowel contrast than expected which may be due to dilution in the proximal small bowel. Nasogastric tube remains in the stomach with the tip at the antrum.   Large amount of stool is present in the colon.  IMPRESSION: Small bowel obstruction with no contrast identified in the cecum at 24 hr.   Electronically Signed   By: Andreas Newport M.D.   On: 03/19/2015 17:11   Dg Abd Portable 1v-small Bowel Obstruction Protocol-initial, 8 Hr Delay  03/19/2015   CLINICAL DATA:  Small bowel obstruction  EXAM: PORTABLE ABDOMEN - 1 VIEW  COMPARISON:  Radiographs and CT 03/18/2015  FINDINGS: Nasogastric tube extends into the distal stomach. There is contrast in the gastric lumen. There is no significant contrast in the small bowel or colon. Dilated stacked loops of small bowel persist in the mid abdomen, probably unchanged. No free air is evident on this single AP supine portable radiograph.  IMPRESSION: Enteric contrast has not reached the colon. Persistent small bowel dilatation consistent with small-bowel obstruction.   Electronically Signed   By: Ellery Plunk M.D.   On: 03/19/2015 01:48   Dg Abd  Portable 1v-small Bowel Protocol-position Verification  03/18/2015   CLINICAL DATA:  Small bowel protocol, nasogastric tube placement  EXAM: PORTABLE ABDOMEN - 1 VIEW  COMPARISON:  Portable exam 1303 hours compared to CT abdomen and pelvis 03/18/2015  FINDINGS: Tip of nasogastric tube projects over distal gastric antrum or duodenal bulb, patient slightly rotated to the RIGHT.  Excreted contrast material within renal collecting systems and distended urinary bladder.  Air-filled loops of dilated small bowel noted in the mid abdomen suspicious for small bowel obstruction.  Some gas and stool remain present within the colon.  Degenerative disc and facet disease changes thoracolumbar spine.  Osseous demineralization.  IMPRESSION: Tip of nasogastric tube projects over either the distal gastric antrum or the duodenal bulb region.  Dilated small bowel loops question small bowel obstruction.   Electronically Signed   By: Ulyses Southward M.D.   On: 03/18/2015 13:16   Dg Foot 2 Views  Right  03/22/2015   CLINICAL DATA:  Dorsal right foot pain for 4 days, no known injury, initial encounter  EXAM: RIGHT FOOT - 2 VIEW  COMPARISON:  None.  FINDINGS: Degenerative changes are noted in the tarsal bones. No acute fracture or dislocation is noted. A small calcaneal spur is noted.  IMPRESSION: Degenerative change without acute abnormality.   Electronically Signed   By: Alcide Clever M.D.   On: 03/22/2015 14:57   Dg Swallowing Func-speech Pathology  04/01/2015    Objective Swallowing Evaluation:    Patient Details  Name: [name] MRN: [id number] Date of Birth: 1933/03/20  Today's Date: 04/01/2015 Time: SLP Start Time (ACUTE ONLY): 0835-SLP Stop Time (ACUTE ONLY): 0857 SLP Time Calculation (min) (ACUTE ONLY): 22 min  Past Medical History:  Past Medical History  Diagnosis Date  . CAD (coronary artery disease)     s/p stenting of LAD 1999- cath 5-08 EF normal LAD 30-40% restenosis. D1  50% D2 80% LCX & RCA minimal plaque  . HTN (hypertension)   . Hyperlipemia   . Anemia     iron deficiency  . Depression   . DVT (deep venous thrombosis)   . Gout   . Osteoporosis   . Pancreatitis   . GERD (gastroesophageal reflux disease)   . Renal insufficiency     Cr 1.2-1.3  . Obesity   . Diabetes mellitus   . Polyarthritis     DJD/ possible PMR  . Vitamin D deficiency   . B12 deficiency   . Tinnitus   . Anxiety   . Aneurysm, thoracic aortic   . Constipation   . Urinary frequency   . Vertigo   . Chronic back pain    Past Surgical History:  Past Surgical History  Procedure Laterality Date  . Hemorrhoid surgery    . Abdominal hysterectomy    . Cholecystectomy    . Tubal ligation    . Coronary angioplasty with stent placement  1999    LAD stent  . Cardiac catheterization  2008    L main 20%, LAD stent patent, D1 50%, D2 80% (small), RCA 20%, EF 55-60%   . Laparoscopy N/A 03/24/2015    Procedure: LAPAROSCOPY DIAGNOSTIC;  Surgeon: Luretha Murphy, MD;   Location: [facility];  Service: General;  Laterality: N/A;  . Laparoscopic  lysis of adhesions N/A 03/24/2015    Procedure: LAPAROSCOPIC LYSIS OF ADHESIONS;  Surgeon: Luretha Murphy,  MD;  Location: [facility];  Service: General;  Laterality: N/A;  . Laparotomy N/A 03/24/2015    Procedure: LAPAROTOMY  with decompression of bowel;  Surgeon: Luretha Murphy, MD;  Location: [facility];  Service: General;  Laterality: N/A;   HPI:  Other Pertinent Information: Pt is a 81 year old female admitted 03/18/15  with SBO secondary to adhesions. She failed conservative therapy and  underwent laparoscopic lysis of adhesions/laparotomy with bowel  decompression on 03/24/15. Her hospital course was complicated by sepsis  secondary to peritoneal contamination with transfer to the SDU on 03/25/15.  She also developed left-sided facial droop/right arm weakness 03/27/15 with  CT scan showing a 4 mm lacunar infarct in the left lentiform nucleus. MRI  failed to show any abnormality. After removal of NG for suction, MD  ordered MBS prior to initiating diet.   No Data Recorded  Assessment / Plan / Recommendation CHL IP CLINICAL IMPRESSIONS 04/01/2015  Therapy Diagnosis Mild pharyngeal phase dysphagia;Mild oral phase  dysphagia  Clinical Impression Mild oral dysphagia characterized by decreased lingual  formation of bolus, improved with straw sips and improving progressively  during exam. oropharyngeal dysphagia also mild with no penetration or  aspiration. Suspect mild standing secretions intially during exam as  pharyngeal residuals decreased and timing of swallow improved with each  trial. Though swallow was initially delayed, by end of exam pt taking  large consecutive sips of thin with minimal delay and minimal residuals.  Recommend pt initiate a dys 3 (mechanical soft) diet with basic aspiration  precautions of upright posture and occasional second swallow to clear oral  cavity and orophrayngeal residuals. SLP will f/u for tolerance during  acute stay.       CHL IP TREATMENT RECOMMENDATION 04/01/2015  Treatment Recommendations  Therapy as outlined in treatment plan below     CHL IP DIET RECOMMENDATION 04/01/2015  SLP Diet Recommendations Dysphagia 3 (Mech soft);Thin  Liquid Administration via (None)  Medication Administration Whole meds with liquid  Compensations Multiple dry swallows after each bite/sip  Postural Changes and/or Swallow Maneuvers (None)     CHL IP OTHER RECOMMENDATIONS 04/01/2015  Recommended Consults (None)  Oral Care Recommendations Oral care BID  Other Recommendations (None)     CHL IP FOLLOW UP RECOMMENDATIONS 03/29/2015  Follow up Recommendations 24 hour supervision/assistance     CHL IP FREQUENCY AND DURATION 04/01/2015  Speech Therapy Frequency (ACUTE ONLY) min 2x/week  Treatment Duration 2 weeks     Pertinent Vitals/Pain NA    SLP Swallow Goals No flowsheet data found.  No flowsheet data found.    CHL IP REASON FOR REFERRAL 04/01/2015  Reason for Referral Objectively evaluate swallowing function     CHL IP ORAL PHASE 04/01/2015  Lips (None)  Tongue (None)  Mucous membranes (None)  Nutritional status (None)  Other (None)  Oxygen therapy (None)  Oral Phase Impaired  Oral - Pudding Teaspoon (None)  Oral - Pudding Cup (None)  Oral - Honey Teaspoon (None)  Oral - Honey Cup (None)  Oral - Honey Syringe (None)  Oral - Nectar Teaspoon (None)  Oral - Nectar Cup (None)  Oral - Nectar Straw (None)  Oral - Nectar Syringe (None)  Oral - Ice Chips (None)  Oral - Thin Teaspoon (None)  Oral - Thin Cup (None)  Oral - Thin Straw (None)  Oral - Thin Syringe (None)  Oral - Puree (None)  Oral - Mechanical Soft (None)  Oral - Regular (None)  Oral - Multi-consistency (None)  Oral - Pill (None)  Oral Phase - Comment (None)      CHL IP PHARYNGEAL PHASE 04/01/2015  Pharyngeal Phase  Impaired  Pharyngeal - Pudding Teaspoon (None)  Penetration/Aspiration details (pudding teaspoon) (None)  Pharyngeal - Pudding Cup (None)  Penetration/Aspiration details (pudding cup) (None)  Pharyngeal - Honey Teaspoon (None)  Penetration/Aspiration details (honey  teaspoon) (None)  Pharyngeal - Honey Cup (None)  Penetration/Aspiration details (honey cup) (None)  Pharyngeal - Honey Syringe (None)  Penetration/Aspiration details (honey syringe) (None)  Pharyngeal - Nectar Teaspoon (None)  Penetration/Aspiration details (nectar teaspoon) (None)  Pharyngeal - Nectar Cup (None)  Penetration/Aspiration details (nectar cup) (None)  Pharyngeal - Nectar Straw (None)  Penetration/Aspiration details (nectar straw) (None)  Pharyngeal - Nectar Syringe (None)  Penetration/Aspiration details (nectar syringe) (None)  Pharyngeal - Ice Chips (None)  Penetration/Aspiration details (ice chips) (None)  Pharyngeal - Thin Teaspoon (None)  Penetration/Aspiration details (thin teaspoon) (None)  Pharyngeal - Thin Cup (None)  Penetration/Aspiration details (thin cup) (None)  Pharyngeal - Thin Straw (None)  Penetration/Aspiration details (thin straw) (None)  Pharyngeal - Thin Syringe (None)  Penetration/Aspiration details (thin syringe') (None)  Pharyngeal - Puree (None)  Penetration/Aspiration details (puree) (None)  Pharyngeal - Mechanical Soft (None)  Penetration/Aspiration details (mechanical soft) (None)  Pharyngeal - Regular (None)  Penetration/Aspiration details (regular) (None)  Pharyngeal - Multi-consistency (None)  Penetration/Aspiration details (multi-consistency) (None)  Pharyngeal - Pill (None)  Penetration/Aspiration details (pill) (None)  Pharyngeal Comment (None)      No flowsheet data found.  No flowsheet data found.        Harlon Ditty, [place] CCC-SLP [phone]  Claudine Mouton 04/01/2015, 10:31 AM    Ct Image Guided Drainage Percut Cath  Peritoneal Retroperit  04/04/2015   CLINICAL DATA:  81 year old with postoperative fluid collections. Concern for intra-abdominal abscesses.  EXAM: CT-GUIDED DRAIN PLACEMENT IN LEFT UPPER ABDOMINAL FLUID COLLECTION  CT-GUIDED DRAIN PLACEMENT IN LEFT LATERAL ABDOMINAL FLUID COLLECTION  Physician: Rachelle Hora. Lowella Dandy, MD  FLUOROSCOPY TIME:  None   MEDICATIONS: 4 mg versed, 100 mcg of fentanyl. A radiology nurse monitored the patient for moderate sedation.  ANESTHESIA/SEDATION: Moderate sedation time: 50 min  PROCEDURE: Informed consent was obtained for CT-guided drain placements. Patient was placed supine on the CT scanner. Images through the abdomen were obtained. The collections in the left upper abdomen and lateral left abdomen were identified. The left side of the abdomen was prepped and draped in sterile fashion. The collection in the left upper abdomen and perisplenic region was initially targeted. Skin was anesthetized with 1% lidocaine. 18 gauge needle was directed into the fluid collection just posterior to the spleen and cloudy yellow fluid was aspirated. A stiff Amplatz wire was placed and the tract was dilated to accommodate a 10.2 [place] multipurpose drain. Catheter sutured to skin and attached to a suction bulb.  Attention was directed to the left lateral abdominal fluid collection. The left lateral abdomen was anesthetized with 1% lidocaine. 18 gauge needle directed into the fluid collection and cloudy yellow fluid was aspirated. Stiff Amplatz wire was placed and the tract was dilated to accommodate a 10.2 [place] multipurpose drain. Catheter sutured to skin and attached to a suction bulb.  FINDINGS: Left upper abdominal fluid collection in the perisplenic region. Drain was successfully placed just posterior to the spleen. There is a second collection in the lateral left mid abdomen. Drain successfully placed in this collection.  Estimated blood loss: Minimal  COMPLICATIONS: None  IMPRESSION: Placement of CT-guided drainage catheters in two left abdominal fluid collections. Cloudy yellow fluid was removed from both collections. Samples were sent for culture.   Electronically Signed  By: Richarda Overlie M.D.   On: 04/04/2015 08:32   Ct Image Guided Drainage Percut Cath  Peritoneal Retroperit  04/04/2015   CLINICAL DATA:  81 year old with  postoperative fluid collections. Concern for intra-abdominal abscesses.  EXAM: CT-GUIDED DRAIN PLACEMENT IN LEFT UPPER ABDOMINAL FLUID COLLECTION  CT-GUIDED DRAIN PLACEMENT IN LEFT LATERAL ABDOMINAL FLUID COLLECTION  Physician: Rachelle Hora. Lowella Dandy, MD  FLUOROSCOPY TIME:  None  MEDICATIONS: 4 mg versed, 100 mcg of fentanyl. A radiology nurse monitored the patient for moderate sedation.  ANESTHESIA/SEDATION: Moderate sedation time: 50 min  PROCEDURE: Informed consent was obtained for CT-guided drain placements. Patient was placed supine on the CT scanner. Images through the abdomen were obtained. The collections in the left upper abdomen and lateral left abdomen were identified. The left side of the abdomen was prepped and draped in sterile fashion. The collection in the left upper abdomen and perisplenic region was initially targeted. Skin was anesthetized with 1% lidocaine. 18 gauge needle was directed into the fluid collection just posterior to the spleen and cloudy yellow fluid was aspirated. A stiff Amplatz wire was placed and the tract was dilated to accommodate a 10.2 [place] multipurpose drain. Catheter sutured to skin and attached to a suction bulb.  Attention was directed to the left lateral abdominal fluid collection. The left lateral abdomen was anesthetized with 1% lidocaine. 18 gauge needle directed into the fluid collection and cloudy yellow fluid was aspirated. Stiff Amplatz wire was placed and the tract was dilated to accommodate a 10.2 [place] multipurpose drain. Catheter sutured to skin and attached to a suction bulb.  FINDINGS: Left upper abdominal fluid collection in the perisplenic region. Drain was successfully placed just posterior to the spleen. There is a second collection in the lateral left mid abdomen. Drain successfully placed in this collection.  Estimated blood loss: Minimal  COMPLICATIONS: None  IMPRESSION: Placement of CT-guided drainage catheters in two left abdominal fluid collections.  Cloudy yellow fluid was removed from both collections. Samples were sent for culture.   Electronically Signed   By: Richarda Overlie M.D.   On: 04/04/2015 08:32     Medical Consultants:    Surgery - Dr. Glenna Fellows   Cardiology   PCCM  Neurology - Dr. Thana Farr   ID  Anti-Infectives:    Mefoxin 03/25/2015 --> 03/26/2015  Rocephin 03/26/2015 -->   Flagyl 03/26/2015 -->  Subjective:   [name] does not feeling like eatiing or doing anything. Wants to sleep.   Objective:    Filed Vitals:   04/12/15 0633 04/12/15 1045 04/12/15 1215 04/12/15 1230  BP: 156/74  150/83 161/81  Pulse:  111 92 93  Temp:      TempSrc:      Resp:      Height:      Weight:      SpO2:        Intake/Output Summary (Last 24 hours) at 04/12/15 1323 Last data filed at 04/12/15 0900  Gross per 24 hour  Intake 2586.66 ml  Output     75 ml  Net 2511.66 ml    Exam: Gen:  NAD Cardiovascular:  RRR, No M/R/G Respiratory:  Lungs CTAB Gastrointestinal:  Abdomen softly distended,  No tenderness, + BS Extremities:  1+ edema   Data Reviewed:    Labs: Basic Metabolic Panel:  Recent Labs Lab 04/07/15 0454 04/08/15 0430 04/09/15 0503 04/10/15 0500 04/11/15 0447  NA 135 133* 133* 134* 135  K 5.0 4.6 4.4 4.1 3.9  CL 104 104 104 106 108  CO2 22 21* 20* 20* 19*  GLUCOSE 136* 121* 125* 131* 104*  BUN 46* 43* 41* 40* 42*  CREATININE 0.82 0.77 0.70 0.71 0.67  CALCIUM 8.9 8.9 9.1 9.0 9.2  MG 1.6* 1.9  --  1.5* 1.9  PHOS 4.2  --   --  4.2 4.2   GFR Estimated Creatinine Clearance: 53.9 mL/min (by C-G formula based on Cr of 0.67). Liver Function Tests:  Recent Labs Lab 04/07/15 0454 04/09/15 0503  AST 43* 39  ALT 32 31  ALKPHOS 204* 199*  BILITOT 0.4 0.4  PROT 7.0 7.2  ALBUMIN 2.0* 1.9*   Coagulation profile No results for input(s): INR, PROTIME in the last 168 hours.  CBC:  Recent Labs Lab 04/06/15 0445 04/07/15 0454 04/08/15 1213 04/09/15 0503  04/11/15 0447  WBC 14.3* 13.9* 11.8* 11.7* 8.1  NEUTROABS  --  12.4*  --   --  6.2  HGB 8.5* 7.8* 8.5* 7.7* 7.7*  HCT 27.4* 25.8* 27.5* 24.9* 24.4*  MCV 82.8 82.4 82.8 82.5 81.1  PLT 530* 506* 605* 538* 618*   Cardiac Enzymes: No results for input(s): CKTOTAL, CKMB, CKMBINDEX, TROPONINI in the last 168 hours. CBG:  Recent Labs Lab 04/11/15 2008 04/12/15 0051 04/12/15 0409 04/12/15 0725 04/12/15 1133  GLUCAP 132* 140* 147* 113* 179*   Sepsis Labs:  Recent Labs Lab 04/07/15 0454 04/08/15 1213 04/09/15 0503 04/11/15 0447  WBC 13.9* 11.8* 11.7* 8.1   Microbiology Recent Results (from the past 240 hour(s))  Culture, routine-abscess     Status: None   Collection Time: 04/03/15  3:18 PM  Result Value Ref Range Status   Specimen Description ABSCESS LEFT ABDOMEN UPPER  Final   Special Requests NONE  Final   Gram Stain   Final    MODERATE WBC PRESENT,BOTH PMN AND MONONUCLEAR NO SQUAMOUS EPITHELIAL CELLS SEEN NO ORGANISMS SEEN Performed at [facility]    Culture   Final    NO GROWTH 3 DAYS Performed at [facility]    Report Status 04/07/2015 FINAL  Final  Culture, routine-abscess     Status: None   Collection Time: 04/03/15  3:18 PM  Result Value Ref Range Status   Specimen Description ABSCESS ABDOMEN MID  Final   Special Requests NONE  Final   Gram Stain   Final    ABUNDANT WBC PRESENT,BOTH PMN AND MONONUCLEAR NO SQUAMOUS EPITHELIAL CELLS SEEN NO ORGANISMS SEEN Performed at [facility]    Culture   Final    FEW ENTEROCOCCUS SPECIES Performed at [facility]    Report Status 04/07/2015 FINAL  Final   Organism ID, Bacteria ENTEROCOCCUS SPECIES  Final      Susceptibility   Enterococcus species - MIC*    VANCOMYCIN 2 SENSITIVE Sensitive     AMPICILLIN <=2 SENSITIVE Sensitive     * FEW ENTEROCOCCUS SPECIES  Culture, Urine     Status: None   Collection Time: 04/06/15  3:27 PM  Result Value Ref Range Status   Specimen  Description URINE, CATHETERIZED  Final   Special Requests NONE  Final   Colony Count   Final    >=100,000 COLONIES/ML Performed at [facility]    Culture   Final    ENTEROBACTER CLOACAE ENTEROCOCCUS SPECIES Performed at [facility]    Report Status 04/09/2015 FINAL  Final   Organism ID, Bacteria ENTEROBACTER CLOACAE  Final   Organism ID, Bacteria ENTEROCOCCUS SPECIES  Final      Susceptibility   Enterobacter cloacae - MIC*    CEFAZOLIN >=64 RESISTANT Resistant     CEFTRIAXONE >=64 RESISTANT Resistant     CIPROFLOXACIN <=0.25 SENSITIVE Sensitive     GENTAMICIN 2 SENSITIVE Sensitive     LEVOFLOXACIN <=0.12 SENSITIVE Sensitive     NITROFURANTOIN 64 INTERMEDIATE Intermediate     TOBRAMYCIN <=1 SENSITIVE Sensitive     TRIMETH/SULFA <=20 SENSITIVE Sensitive     PIP/TAZO >=128 RESISTANT Resistant     * ENTEROBACTER CLOACAE   Enterococcus species - MIC*    AMPICILLIN <=2 SENSITIVE Sensitive     LEVOFLOXACIN 0.5 SENSITIVE Sensitive     NITROFURANTOIN <=16 SENSITIVE Sensitive     VANCOMYCIN 2 SENSITIVE Sensitive     TETRACYCLINE >=16 RESISTANT Resistant     * ENTEROCOCCUS SPECIES  Culture, blood (routine x 2)     Status: None   Collection Time: 04/06/15  4:40 PM  Result Value Ref Range Status   Specimen Description BLOOD RIGHT ARM  Final   Special Requests BOTTLES DRAWN AEROBIC AND ANAEROBIC 10CC  Final   Culture   Final    NO GROWTH 5 DAYS Performed at [facility]    Report Status 04/12/2015 FINAL  Final  Culture, blood (routine x 2)     Status: None   Collection Time: 04/06/15  4:50 PM  Result Value Ref Range Status   Specimen Description BLOOD RIGHT HAND  Final   Special Requests BOTTLES DRAWN AEROBIC AND ANAEROBIC 10CC  Final   Culture   Final    NO GROWTH 5 DAYS Performed at [facility]    Report Status 04/12/2015 FINAL  Final     Medications:   . sodium chloride   Intravenous Once  . antiseptic oral rinse  7 mL Mouth Rinse  [id number]  . aspirin  300 mg Rectal Daily   Or  . aspirin  325 mg Oral Daily  . bisacodyl  10 mg Rectal Daily  . chlorhexidine  15 mL Mouth Rinse BID  . docusate sodium  100 mg Oral BID  . feeding supplement (ENSURE ENLIVE)  237 mL Oral BID BM  . insulin aspart  0-15 Units Subcutaneous 6 times per day  . lip balm  1 application Topical BID  . metoprolol  2.5 mg Intravenous 4 times per day  . polyethylene glycol  17 g Oral BID   Continuous Infusions: . sodium chloride 10 mL/hr (03/29/15 0800)  . Marland KitchenTPN (CLINIMIX-E) Adult 70 mL/hr at 04/11/15 1717   And  . fat emulsion 240 mL (04/11/15 1718)  . Marland KitchenTPN (CLINIMIX-E) Adult     And  . fat emulsion      Time spent: 25 minutes.   LOS: 25 days   [facility]  [facility] Pager [phone]  If 7PM-7AM, please contact night-coverage at [email or web address], password [email or web address] for any overnight needs.  04/12/2015, 1:23 PM

## 2015-04-12 NOTE — Progress Notes (Addendum)
Occupational Therapy Treatment Patient Details Name: [name] MRN: [id number] DOB: [birth year] Today's Date: 04/12/2015    History of present illness 81 year old female with history of hypertension, coronary artery disease, hyperlipidemia, diabetes and pt s/p lap converted to open LOA for SBO on 5/12, complicated by Left sided facial droop / right arm weakness / acute CVA in left lentiform nucleus however neurologist note states reviewed MRI and no acute changes seen   OT comments  Pt very sleepy during session and with decreased participation today. Pt also complaining of feeling constipated and having rectum pain. MD in room during part of session and aware of this pain and nursing also made aware. Pt requires +2 total assist for squat pivot transfer to recliner. Pt declining to eat any breakfast when offered several times. Will need SNF at d/c.    Follow Up Recommendations  SNF;Supervision/Assistance - 24 hour    Equipment Recommendations  Other (comment);3 in 1 bedside comode (if pt d/c home, will need 3in1, possibly drop arm)    Recommendations for Other Services      Precautions / Restrictions Precautions Precautions: Fall Precaution Comments: 2 JP drains Restrictions Weight Bearing Restrictions: No       Mobility Bed Mobility Overal bed mobility: Needs Assistance     Sidelying to sit: +2 for physical assistance;Total assist       General bed mobility comments: pt sleepy, max multimodal cues. pt positioned on R side when therapy arrived. +2 total for sidelying to EOB with pt not participating today.   Transfers Overall transfer level: Needs assistance Equipment used: Rolling walker (2 wheeled) Transfers: Sit to/from Stand Sit to Stand: +2 physical assistance;Max assist   Squat pivot transfers: +2 physical assistance;Total assist     General transfer comment: Stood to assist with periarea hygiene with pt using walker to stand. Pt could only tolerate brief  standing so sat down and then squat pivot transfer to chair. Pt continues to be sleepy.    Balance                                   ADL       Grooming: Wash/dry face;Total assistance;Bed level                   Toilet Transfer: +2 for physical assistance;Total assistance;Squat-pivot             General ADL Comments: Pt sleepy upon arrival and moaning, stating she was hurting. Placed washcloth in pt's R hand but pt not intiating to wash her face with commands given. Assisted to EOB and MD arriving to check on pt. Pt complaning of feeling constipated and pain from this while MD present. Pt opening eyes intermittently but requring max tactile and verbal cues to try to stay awake and participate. Nursing made aware. Pt declining eating her breakfast when offered it several times during session. Pivoted to the chair. Added goal today for toilet transfer and hygiene in case pt does d/c home instead of SNF for family education.       Vision                     Perception     Praxis      Cognition   Behavior During Therapy: [facility] for tasks assessed/performed Overall Cognitive Status: Difficult to assess  Extremity/Trunk Assessment               Exercises     Shoulder Instructions       General Comments      Pertinent Vitals/ Pain       Pain Assessment: Faces Faces Pain Scale: Hurts whole lot Pain Location: rectum-pt feeling constipated. Pain Intervention(s): Repositioned;Other (comment) (nursing and MD made aware.)  Home Living                                          Prior Functioning/Environment              Frequency Min 2X/week     Progress Toward Goals  OT Goals(current goals can now be found in the care plan section)  Progress towards OT goals: Not progressing toward goals - comment (level of arousal, goal added for toileting, hygiene)     Plan Discharge plan remains  appropriate    Co-evaluation    PT/OT/SLP Co-Evaluation/Treatment: Yes Reason for Co-Treatment: For patient/therapist safety;Complexity of the patient's impairments (multi-system involvement)   OT goals addressed during session: ADL's and self-care;Proper use of Adaptive equipment and DME      End of Session Equipment Utilized During Treatment: Gait belt   Activity Tolerance Other (comment) (level of arousal.)   Patient Left in chair;with call bell/phone within reach   Nurse Communication Mobility status;Need for lift equipment        Time: [id number] OT Time Calculation (min): 24 min  Charges: OT General Charges $OT Visit: 1 Procedure OT Treatments $Therapeutic Activity: 8-22 mins  Lennox Laity  [phone] 04/12/2015, 10:58 AM

## 2015-04-12 NOTE — Progress Notes (Signed)
[facility] Progress Note  19 Days Post-Op  Subjective: Pt only able to tell me that her abdomen hurts.  Today it hurts in the RLQ.  No incisional pain.  Says she has nausea sometimes, but not now.  Not able to eat much.  Wants to go to sleep.  Objective: Vital signs in last 24 hours: Temp:  [98 F (36.7 C)-98.9 F (37.2 C)] 98.4 F (36.9 C) (05/31 0411) Pulse Rate:  [92-111] 111 (05/31 1045) Resp:  [16-20] 18 (05/31 0411) BP: (118-178)/(65-86) 156/74 mmHg (05/31 0633) SpO2:  [100 %] 100 % (05/31 0411) Last BM Date: 04/11/15  Intake/Output from previous day: 05/30 0701 - 05/31 0700 In: 2646.7 [P.O.:360; I.V.:250; TPN:2006.7] Out: 75 [Drains:75] Intake/Output this shift: Total I/O In: 60 [P.O.:60] Out: -   PE: Gen:  Alert, NAD, pleasant Abd: Soft, ND, tender in RLQ today, +BS, no HSM, incisions C/D/I with staples removed, some steristrips in place.  Drain #1 serous drainage, Drain #2 with cloudy tan/grayish translucent output.  Drainge was a total of 35mL/24hr.   Lab Results:   Recent Labs  04/11/15 0447  WBC 8.1  HGB 7.7*  HCT 24.4*  PLT 618*   BMET  Recent Labs  04/10/15 0500 04/11/15 0447  NA 134* 135  K 4.1 3.9  CL 106 108  CO2 20* 19*  GLUCOSE 131* 104*  BUN 40* 42*  CREATININE 0.71 0.67  CALCIUM 9.0 9.2   PT/INR No results for input(s): LABPROT, INR in the last 72 hours. CMP     Component Value Date/Time   NA 135 04/11/2015 0447   K 3.9 04/11/2015 0447   CL 108 04/11/2015 0447   CO2 19* 04/11/2015 0447   GLUCOSE 104* 04/11/2015 0447   BUN 42* 04/11/2015 0447   CREATININE 0.67 04/11/2015 0447   CALCIUM 9.2 04/11/2015 0447   PROT 7.2 04/09/2015 0503   ALBUMIN 1.9* 04/09/2015 0503   AST 39 04/09/2015 0503   ALT 31 04/09/2015 0503   ALKPHOS 199* 04/09/2015 0503   BILITOT 0.4 04/09/2015 0503   GFRNONAA >60 04/11/2015 0447   GFRAA >60 04/11/2015 0447   Lipase     Component Value Date/Time   LIPASE 105* 04/09/2015 0503        Studies/Results: No results found.  Anti-infectives: Anti-infectives    Start     Dose/Rate Route Frequency Ordered Stop   04/07/15 2000  vancomycin (VANCOCIN) 500 mg in sodium chloride 0.9 % 100 mL IVPB  Status:  Discontinued     500 mg 100 mL/hr over 60 Minutes Intravenous Every 12 hours 04/07/15 1859 04/09/15 1500   04/07/15 1200  fluconazole (DIFLUCAN) IVPB 200 mg  Status:  Discontinued     200 mg 100 mL/hr over 60 Minutes Intravenous Every 24 hours 04/07/15 1120 04/08/15 1535   03/26/15 1600  cefTRIAXone (ROCEPHIN) 2 g in dextrose 5 % 50 mL IVPB - Premix    Comments:  Pharmacy may adjust dosing strength / duration / interval for maximal efficacy   2 g 100 mL/hr over 30 Minutes Intravenous Every 24 hours 03/26/15 1007 03/30/15 1535   03/26/15 1200  metroNIDAZOLE (FLAGYL) IVPB 500 mg     500 mg 100 mL/hr over 60 Minutes Intravenous Every 6 hours 03/26/15 1007 03/30/15 1850   03/25/15 1600  cefOXitin (MEFOXIN) 1 g in dextrose 5 % 50 mL IVPB  Status:  Discontinued     1 g 100 mL/hr over 30 Minutes Intravenous Every 12 hours 03/25/15 0801 03/26/15  1007   03/25/15 0800  cefOXitin (MEFOXIN) 1 g in dextrose 5 % 50 mL IVPB  Status:  Discontinued     1 g 100 mL/hr over 30 Minutes Intravenous 3 times per day 03/25/15 0752 03/25/15 0758   03/24/15 1700  cefOXitin (MEFOXIN) 1 g in dextrose 5 % 50 mL IVPB     1 g 100 mL/hr over 30 Minutes Intravenous Every 6 hours 03/24/15 1511 03/25/15 0512   03/24/15 0915  cefOXitin (MEFOXIN) 2 g in dextrose 5 % 50 mL IVPB    Comments:  Pharmacy may adjust dosing strength, interval, or rate of medication as needed for optimal therapy for the patient Send with patient on call to the OR.  Anesthesia to complete antibiotic administration <54min prior to incision per [facility].   2 g 100 mL/hr over 30 Minutes Intravenous On call to O.R. 03/24/15 0910 03/24/15 1042       Assessment/Plan POD #19, s/p lap converted to open LOA for SBO, Dr.  Daphine Deutscher -SLP following, On D3 diet, but appetite low -Mobilize as able s/p CVA 03/27/15 -Orals for pain, limit narcotics -TNP increased back to full dosage, may need feeding tube if she's not able to get enough calories, dietitian following -Abx: Rocephin/Flagyl D5/5 now discontinued -Pulmonary toilet -Surgical drain removed 03/27/15 -Remove staples 04/07/15 -New CT on 04/02/15 shows 2 intra-abdominal fluid collections, s/p IR drainage on 04/03/15 - left upper abdomen & Left lateral abdomen, cultures show few enterococcus. ID recommending stopping Vancomycin and observing off antibiotics -CT on 04/08/15 showed improvement in fluid collections -Repeat CT Thursday or Friday -Bowel regimen encouraged  PCM/TPN -Continue TPN until PO intake improves Leukocytosis -down to 8.1 yesterday, intra-abdominal fluid collections ABL Anemia -Hgb down to 7.7 yesterday, 2 units pRBC on 04/01/15 ?CVA -per neurology and primary DVT prophylaxis -SCDs, heparin held for thrombocytopenia per primary service, on aspirin  Anticipate SNF at discharge    LOS: 25 days    Nonie Hoyer 04/12/2015, 12:20 PM Pager: [phone]

## 2015-04-12 NOTE — Progress Notes (Signed)
Nutrition Follow-up  DOCUMENTATION CODES:  Non-severe (moderate) malnutrition in context of chronic illness  INTERVENTION: - Continue Ensure Enlive BID and Magic Cup TID - Recommend PEG with Jevity 1.2 @ 55 mL/hr to provide 1584 kcal, 73 grams protein - RD to continue to monitor for needs  NUTRITION DIAGNOSIS:  Malnutrition related to chronic illness as evidenced by moderate depletion of body fat, severe depletion of muscle mass. -ongoing  GOAL:  Patient will meet greater than or equal to 90% of their needs -met with TPN  MONITOR:  PO intake, Supplement acceptance, TF tolerance, Weight trends, Labs, I & O's, Other (Comment) (TPN wean)  ASSESSMENT: 81 year old female with history of hypertension, coronary artery disease, hyperlipidemia, iron deficiency anemia, depression, osteoporosis, mild chronic kidney disease presented to the ED with ongoing nausea for 4 days and diffuse crampy lower abdominal pain since one day prior to admission. Associated poor by mouth intake and generalized weakness. Patient was found to have small bowel obstruction.  5/23: - Attempting to wean TPN starting today - 0-10% of meals over the past 3 days - Receiving Clinimix 5/15 @ 70 mL/hr with 20% lipids @ 10 mL/hr at time of visit (84 grams protein, 1672 kcal)  5/26: - POD #14.  - Per pharmacy, plan to continue current TPN regimen: Clinimix 5/15 @ 40 mL/hr with 20% lipids @ 10 mL/hr which is providing 1162 kcal, 48 grams protein. PO intake over the past 24 hours in addition to TPN: 1908 kcal, 85.4 grams protein which is meeting needs. - Per notes and discussion with pharmacist during rounds this AM, possible plan for PEG if PO intakes continue to remain low. He indicates that no medications have been ordered for thrush. Also noted no appetite stimulants ordered; may be beneficial especially if thrush is improving and PEG is not able to be placed.  5/31: Pt eating 0-25% and in rounds this AM RN indicates  that pt has been refusing PO intakes by keeping mouth shut, refusing to open for meals or medications. Pt now receiving Clinimix E 5/15 @ 70 mL/hr with 20% lipids @ 10 mL/hr which provides 1673 kcal, 84 grams protein. Spoke with pharmacist after rounds and he and RD unsure at this time why TPN rate increased over the weekend; may be due to lack of intakes and need to meet nutrition needs via nutrition support alone. Spoke with surgical PA last week; at that time brief mention of PEG to family by surgical team and per PA report, family was open to the idea. Pt not able to meet needs PO at this time. Per notes, it sounds as though GI functionality has returned (as indicated by ability to have PO diet). Based on these findings, TF preferential to TPN to maintain GI activity and functionality. Should pt need to remain on nutrition support, recommend PEG with TF to meet needs and d/c TPN. Page sent to surgery PA today not yet returned.  Pt meeting needs with TPN. Medications reviewed. Labs reviewed; CBGs: 81-198 mg/dL.  Height:  Ht Readings from Last 1 Encounters:  03/29/15 5' 4"  (1.626 m)    Weight:  Wt Readings from Last 1 Encounters:  03/27/15 149 lb 4 oz (67.7 kg)    Ideal Body Weight:  54.5 kg  Wt Readings from Last 10 Encounters:  03/27/15 149 lb 4 oz (67.7 kg)  12/31/14 145 lb (65.772 kg)  12/07/14 147 lb 8 oz (66.906 kg)  09/23/14 150 lb (68.04 kg)  08/19/14 150 lb (68.04 kg)  07/22/14 153 lb 12 oz (69.741 kg)  06/24/14 156 lb (70.761 kg)  05/26/14 162 lb (73.483 kg)  04/12/14 164 lb (74.39 kg)  03/14/14 172 lb (78.019 kg)    BMI:  Body mass index is 25.61 kg/(m^2).  Estimated Nutritional Needs:  Kcal:  1500-1700  Protein:  75-85g  Fluid:  1.5L/day  Skin:  Wound (see comment) (stage 2 sacral ulcer)  Diet Order:  DIET DYS 3 Room service appropriate?: Yes; Fluid consistency:: Thin TPN (CLINIMIX-E) Adult TPN (CLINIMIX-E) Adult  EDUCATION NEEDS:  No education needs  identified at this time   Intake/Output Summary (Last 24 hours) at 04/12/15 1210 Last data filed at 04/12/15 0900  Gross per 24 hour  Intake 2646.66 ml  Output     75 ml  Net 2571.66 ml    Last BM:  5/30    Jarome Matin, RD, LDN Inpatient Clinical Dietitian Pager # [phone] After hours/weekend pager # [phone]

## 2015-04-12 NOTE — Care Management Note (Signed)
Case Management Note  Patient Details  Name: [name] MRN: [id number] Date of Birth: [birth year]  Subjective/Objective:                    Action/Plan:   Expected Discharge Date:   (unknown)               Expected Discharge Plan:  Skilled Nursing Facility  In-House Referral:  Clinical Social Work  Discharge planning Services  CM Consult  Post Acute Care Choice:    Choice offered to:     DME Arranged:    DME Agency:     HH Arranged:    HH Agency:     Status of Service:  Completed, signed off  Medicare Important Message Given:  Yes Date Medicare IM Given:  03/21/15 Medicare IM give by:  Aileen Pilot Date Additional Medicare IM Given:  04/05/15 Additional Medicare Important Message give by:  Geni Bers, RN  If discussed at Long Length of Stay Meetings, dates discussed: 04/12/15   Additional Comments:  Lanier Clam, RN 04/12/2015, 1:30 PM

## 2015-04-13 ENCOUNTER — Inpatient Hospital Stay (HOSPITAL_COMMUNITY): Payer: Commercial Managed Care - HMO

## 2015-04-13 LAB — GLUCOSE, CAPILLARY
GLUCOSE-CAPILLARY: 139 mg/dL — AB (ref 65–99)
GLUCOSE-CAPILLARY: 163 mg/dL — AB (ref 65–99)
GLUCOSE-CAPILLARY: 86 mg/dL (ref 65–99)
Glucose-Capillary: 123 mg/dL — ABNORMAL HIGH (ref 65–99)
Glucose-Capillary: 145 mg/dL — ABNORMAL HIGH (ref 65–99)
Glucose-Capillary: 158 mg/dL — ABNORMAL HIGH (ref 65–99)

## 2015-04-13 LAB — BASIC METABOLIC PANEL
Anion gap: 7 (ref 5–15)
BUN: 45 mg/dL — ABNORMAL HIGH (ref 6–20)
CO2: 20 mmol/L — AB (ref 22–32)
Calcium: 9.1 mg/dL (ref 8.9–10.3)
Chloride: 107 mmol/L (ref 101–111)
Creatinine, Ser: 0.68 mg/dL (ref 0.44–1.00)
GFR calc Af Amer: >60 mL/min (ref >60)
GFR calc non Af Amer: >60 mL/min (ref >60)
Glucose, Bld: 157 mg/dL — ABNORMAL HIGH (ref 65–99)
Potassium: 4.9 mmol/L (ref 3.5–5.1)
Sodium: 134 mmol/L — ABNORMAL LOW (ref 135–145)

## 2015-04-13 LAB — CBC
HEMATOCRIT: 31.8 % — AB (ref 36.0–46.0)
Hemoglobin: 10.2 g/dL — ABNORMAL LOW (ref 12.0–15.0)
MCH: 26.8 pg (ref 26.0–34.0)
MCHC: 32.1 g/dL (ref 30.0–36.0)
MCV: 83.7 fL (ref 78.0–100.0)
Platelets: 541 10*3/uL — ABNORMAL HIGH (ref 150–400)
RBC: 3.8 MIL/uL — ABNORMAL LOW (ref 3.87–5.11)
RDW: 18.8 % — AB (ref 11.5–15.5)
WBC: 8.4 10*3/uL (ref 4.0–10.5)

## 2015-04-13 LAB — PREPARE RBC (CROSSMATCH)

## 2015-04-13 MED ORDER — ZOLPIDEM TARTRATE 5 MG PO TABS
5.0000 mg | ORAL_TABLET | Freq: Every evening | ORAL | Status: DC | PRN
  Filled 2015-04-13: qty 1

## 2015-04-13 MED ORDER — FAT EMULSION 20 % IV EMUL
240.0000 mL | INTRAVENOUS | Status: AC
  Administered 2015-04-13: 240 mL via INTRAVENOUS
  Filled 2015-04-13: qty 250

## 2015-04-13 MED ORDER — ACETAMINOPHEN 500 MG PO TABS
1000.0000 mg | ORAL_TABLET | Freq: Three times a day (TID) | ORAL | Status: DC
  Administered 2015-04-13: 1000 mg via ORAL

## 2015-04-13 MED ORDER — TRACE MINERALS CR-CU-MN-SE-ZN 10-1000-500-60 MCG/ML IV SOLN
INTRAVENOUS | Status: AC
  Administered 2015-04-13: 18:00:00 via INTRAVENOUS
  Filled 2015-04-13: qty 1680

## 2015-04-13 MED ORDER — ACETAMINOPHEN 325 MG PO TABS
650.0000 mg | ORAL_TABLET | Freq: Four times a day (QID) | ORAL | Status: DC | PRN
  Administered 2015-04-13 – 2015-04-20 (×10): 650 mg via ORAL
  Filled 2015-04-13 (×12): qty 2

## 2015-04-13 MED ORDER — LIDOCAINE HCL 1 % IJ SOLN
INTRAMUSCULAR | Status: AC
  Administered 2015-04-13: 17:00:00
  Filled 2015-04-13: qty 20

## 2015-04-13 NOTE — Progress Notes (Signed)
Patient ID: [name], female   DOB: [birth year], 81 y.o.   MRN: [id number]   Patient currently on our schedule for PICC exchange and for Perc G-Tube.  She is S/p LOA for SBO 5/13, drainage of left upper/lateral abd fluid collections 5/22; afebrile; fluid cx's- enterococcus, urine cx- enterobacter, enterococcus- plans as per ID; latest CT 5/27 shows improved but unresolved left abd fluid collections- cont drain irrigation and check CT later this week as per [facility] to re- eval/possibly pull drains   Patient also with current bag of TPN infusing.  Will plan for PICC exchange on 6/2 after current TPN is complete.  Will hold off on G-Tube until left drains have been removed.  I discussed plan and reasoning behind desions with Family who was at the bedside and they understand and agree.  Corrin Parker, PA-C

## 2015-04-13 NOTE — Progress Notes (Signed)
Progress Note   [name] XBJ:[id number] DOB: February 17, 1933 DOA: 03/18/2015 PCP: [name], MD   Brief Narrative:   [name] is an 81 y.o. female with a PMH of hypertension, CAD, hyperlipidemia and diabetes who was admitted 03/18/15 with SBO secondary to adhesions. She failed conservative therapy and underwent laparoscopic lysis of adhesions/laparotomy with bowel decompression on 03/24/15. Her hospital course was complicated by sepsis secondary to peritoneal contamination with transfer to the SDU on 03/25/15. She also developed left-sided facial droop/right arm weakness 03/27/15 with CT scan showing a 4 mm lacunar infarct in the left lentiform nucleus. MRI failed to show any abnormality. Due to failure to improve an ongoing high need for pain control, a repeat CT scan was done 04/02/15 which showed 2 large fluid collections. She underwent percutaneous drainage with placement of abscess drains by IR 04/04/15. Patient has fever on 5/25, but so far all cultures are negative and repeat CXR shows worsening atelectasis and no pneumonia. Repeat CT today shows improvement in the abscess. The abscess culture grew enterococcus and vancomycin started. ID consulted for recommendations. Since she has been afebrile for over 48 hours, recommended to stop the vancomycin. .   Assessment/Plan:   Principal Problem:   Small bowel obstruction s/p exlap/LOA/decompression 03/25/2015 complicated by severe sepsis secondary to peritonitis / abdominal pain now complicated by intra-abdominal abscess/fluid collection - Status post laparoscopic lysis of adhesions/laparotomy with bowel decompression 03/24/15. - Subsequently developed acute peritonitis/sepsis. - Initially treated with Mefoxin starting 03/25/15. Antibiotics switched to Flagyl/Rocephin 03/26/15.   - CT scan of the abdomen/pelvis done 04/02/15 which showed 2 large fluid collections.  - 2 percutaneous drains placed by IR 04/03/15 to drain fluid collections.    -Repeat CT today shows improvement in the abscess. The abscess culture grew enterococcus and vancomycin started. ID consulted for recommendations. Discontinued vancomycin today as she remained afebrile and she is improving.  - afebrile and leukcytosis is improving.  Patient not eating well, g tube to be considered. Talking to the family about it.  - plan to repeat CT on Thursday as per surgery.   Active Problems:   Facial droop / right arm weakness / possible acute CVA left lentiform nucleus - On 03/27/15, CT of the head done to evaluate symptoms showed an acute 4 mm lacunar infarct in the left lentiform nucleus. - Subsequent MRI negative, although clinical findings consistent with acute CVA. MRA negative for stenosis. - 2-D echo with normal EF, no diastolic dysfunction noted. No intracardiac masses or thrombi. - Carotid Dopplers negative for significant stenosis (1-39% bilaterally). - Hemoglobin A1c 6.3%. Cholesterol 99. - Evaluated by neurology, no further stroke workup recommended. - Continue aspirin. - PT/OT.    Hypokalemia/hypomagnesemia - Monitor and replace electrolytes as needed.    Acute postoperative blood loss anemia - Status post 2 units of PRBCs 04/01/15. - Continue to monitor hemoglobin and transfuse 1 unit of prbc today.     Diabetes type 2, controlled - Currently being managed with moderate scale SSI, change to AC HS.  CBGs well controlled.  - Hemoglobin A1c 6.3%. CBG (last 3)   Recent Labs  04/13/15 0422 04/13/15 0732 04/13/15 1142  GLUCAP 86 123* 139*        Anxiety state/Adjustment disorder with mixed anxiety and depressed mood - Mood has been depressed with limited motivation to participate in PT.    Coronary atherosclerosis - Continue aspirin therapy.    Acute renal failure secondary to sepsis  - Resolved with  IV fluids.    Right foot pain  - Radiographs negative for fracture. Degenerative changes noted.    Polymyalgia rheumatica/Chronic fatigue  disorder - PT/OT.    Elevated blood pressure - Continue as needed Lopressor.    Malnutrition of moderate degree - Continue TPN for nutritional support. Begin to wean.    Acute delirium - Avoid sedating medications. Family requests patient does not get Ativan or Haldol or percocet or morphine.    Thrombocytopenia - Mild. Likely related to recent sepsis.  Constipation: Increased her miralax, pt requesting for enema. Improved. One bm yesterday after enema.   Poor appetite and failure to thrive: She might need a G tube as there is none to minimal po intake.     DVT Prophylaxis - Continue SCDs.  Code Status: Full. Family Communication: none  present today.   Disposition Plan: Home with daughter when diet advanced,.   IV Access:    PICC placed 03/23/15   Procedures and diagnostic studies:   Dg Chest 2 View  04/06/2015   CLINICAL DATA:  Fever  EXAM: CHEST  2 VIEW  COMPARISON:  03/27/2015  FINDINGS: Cardiac shadow is mildly enlarged. Left basilar atelectasis is again identified. Two recently placed drainage catheters are noted in the left upper abdomen. Mild right basilar atelectasis is noted. A left-sided PICC line is noted with the tip at the junction of the innominate veins. This has withdrawn slightly in the interval from the prior exam. No acute bony abnormality is seen.  IMPRESSION: Bibasilar atelectasis. This has increased in the interval from the prior exam.  Left upper quadrant drainage catheters.   Electronically Signed   By: Alcide Clever M.D.   On: 04/06/2015 14:57   Ct Head Wo Contrast  03/27/2015   CLINICAL DATA:  RIGHT-sided facial droop. Symptoms began earlier today. Altered mental status.  EXAM: CT HEAD WITHOUT CONTRAST  TECHNIQUE: Contiguous axial images were obtained from the base of the skull through the vertex without intravenous contrast.  COMPARISON:  05/27/2014.  FINDINGS: Possible acute 4 mm lacune, LEFT lentiform nucleus as seen on image 12 series 5. This was  not clearly present on in 2015 scan.  No other areas of concern for cortical or subcortical infarction.  No hemorrhage, mass lesion, hydrocephalus, or extra-axial fluid.  Cerebral and cerebellar atrophy. Generalized white matter hypoattenuation, likely small vessel disease. Than the calvarium intact. Vascular calcification. No sinus or mastoid disease. Dense lenticular opacities.  IMPRESSION: Possible acute 4 mm lacunar infarct LEFT lentiform nucleus. If further investigation desired, and no contraindications, consider MRI brain.   Electronically Signed   By: Davonna Belling M.D.   On: 03/27/2015 16:25   Mr Maxine Glenn Head Wo Contrast  03/28/2015   CLINICAL DATA:  81 year old with right-sided facial droop. Altered mental status. Subsequent encounter.  EXAM: MRI HEAD WITHOUT CONTRAST  MRA HEAD WITHOUT CONTRAST  TECHNIQUE: Multiplanar, multiecho pulse sequences of the brain and surrounding structures were obtained without intravenous contrast. Angiographic images of the head were obtained using MRA technique without contrast.  COMPARISON:  03/27/2015 head CT.  FINDINGS: MRI HEAD FINDINGS  No acute infarct.  No intracranial hemorrhage.  Remote tiny infarct right cerebellum. Minimal small vessel disease type changes.  Incidentally noted is a prominent peri vascular space left lenticular nucleus.  Global atrophy without hydrocephalus.  No intracranial mass lesion noted on this unenhanced exam.  Cervical medullary junction unremarkable. Mild spinal stenosis C3-4.  Expanded partially empty sella without other findings of pseudotumor cerebri  Pineal  region unremarkable.  No intracranial mass lesion noted on this unenhanced exam.  MRA HEAD FINDINGS  Mild narrowing pre cavernous/ cavernous segment right internal carotid artery.  Mild ectasia left internal carotid artery cavernous segment. The minimal bulge along the left internal carotid artery cavernous segment most likely related to slight ectasia of the left ophthalmic artery  origin rather than small aneurysm. Mild motion degradation limits evaluation.  Slightly ectatic anterior communicating artery without aneurysm.  Middle cerebral artery mild branch vessel irregularity bilaterally.  No significant stenosis distal vertebral arteries or basilar artery. Regions of mild irregularity and slight narrowing.  IMPRESSION: MRI HEAD  No acute infarct.  Remote tiny infarct right cerebellum.  Incidentally noted is a prominent peri vascular space left lenticular nucleus.  Global atrophy without hydrocephalus.  Expanded partially empty sella without other findings of pseudotumor cerebri  MRA HEAD FINDINGS  No medium or large size vessel significant stenosis or occlusion. Evaluation of branch vessels slightly limited by motion degradation.   Electronically Signed   By: Lacy Duverney M.D.   On: 03/28/2015 12:02   Mr Brain Wo Contrast  03/28/2015   CLINICAL DATA:  81 year old with right-sided facial droop. Altered mental status. Subsequent encounter.  EXAM: MRI HEAD WITHOUT CONTRAST  MRA HEAD WITHOUT CONTRAST  TECHNIQUE: Multiplanar, multiecho pulse sequences of the brain and surrounding structures were obtained without intravenous contrast. Angiographic images of the head were obtained using MRA technique without contrast.  COMPARISON:  03/27/2015 head CT.  FINDINGS: MRI HEAD FINDINGS  No acute infarct.  No intracranial hemorrhage.  Remote tiny infarct right cerebellum. Minimal small vessel disease type changes.  Incidentally noted is a prominent peri vascular space left lenticular nucleus.  Global atrophy without hydrocephalus.  No intracranial mass lesion noted on this unenhanced exam.  Cervical medullary junction unremarkable. Mild spinal stenosis C3-4.  Expanded partially empty sella without other findings of pseudotumor cerebri  Pineal region unremarkable.  No intracranial mass lesion noted on this unenhanced exam.  MRA HEAD FINDINGS  Mild narrowing pre cavernous/ cavernous segment right  internal carotid artery.  Mild ectasia left internal carotid artery cavernous segment. The minimal bulge along the left internal carotid artery cavernous segment most likely related to slight ectasia of the left ophthalmic artery origin rather than small aneurysm. Mild motion degradation limits evaluation.  Slightly ectatic anterior communicating artery without aneurysm.  Middle cerebral artery mild branch vessel irregularity bilaterally.  No significant stenosis distal vertebral arteries or basilar artery. Regions of mild irregularity and slight narrowing.  IMPRESSION: MRI HEAD  No acute infarct.  Remote tiny infarct right cerebellum.  Incidentally noted is a prominent peri vascular space left lenticular nucleus.  Global atrophy without hydrocephalus.  Expanded partially empty sella without other findings of pseudotumor cerebri  MRA HEAD FINDINGS  No medium or large size vessel significant stenosis or occlusion. Evaluation of branch vessels slightly limited by motion degradation.   Electronically Signed   By: Lacy Duverney M.D.   On: 03/28/2015 12:02   Ct Abdomen Pelvis W Contrast  04/08/2015   CLINICAL DATA:  SOB, Abscess drains 04/04/2015. Surg: TAH, Gb, tubal, laparoscopy adhesion. Hx of aortic thoracic aneurysm. Elev WBC's, fever.  EXAM: CT ABDOMEN AND PELVIS WITH CONTRAST  TECHNIQUE: Multidetector CT imaging of the abdomen and pelvis was performed using the standard protocol following bolus administration of intravenous contrast.  CONTRAST:  OMNIPAQUE IOHEXOL 300 MG/ML  SOLN  COMPARISON:  04/03/2015 and earlier studies  FINDINGS: Small left pleural effusion. Consolidation/atelectasis with  air bronchograms in the posterior and medial basal segments left lower lobe, and patchy airspace disease posteriorly at the right lung base. Coronary calcifications.  Stable percutaneous drain from intercostal approach posterior to the spleen, with some decrease in the subcapsular/subphrenic collection, with moderate  residual. Catheter is at the dependent posterior aspect of the residual component.  Stable left mid abdominal percutaneous pigtail drain from an intercostal approach, with decrease in size of thick-walled complex fluid collection, residual 2.1 x 4.3 cm. Catheter appears well positioned centrally within the residual.  No new intra-abdominal collections. Surgical clips in the gallbladder fossa. Stable mild central intrahepatic biliary ductal dilatation and dilatation of the common duct. No focal liver lesion. Unremarkable spleen, adrenal glands, pancreas, kidneys. No hydronephrosis. Patchy aortoiliac calcifications without aneurysm or stenosis. Portal vein patent. Stomach, small bowel, and colon are nondilated. Multiple diverticula from the sigmoid colon without adjacent inflammatory/edematous change. The urinary bladder is incompletely distended. Bilateral pelvic phleboliths. Old healed pubic fractures. Spondylitic changes in the mid and lower lumbar spine, with mild dextroscoliosis.  IMPRESSION: 1. Some interval improvement in subphrenic and left mid abdomen abscess collections post percutaneous drain placement x2. Both have residual undrained components but the drain catheters appear well positioned. 2. Persistent small left pleural effusion and bibasilar airspace disease left greater than right. 3. Atherosclerosis, including aortoiliac and coronary artery disease. Please note that although the presence of coronary artery calcium documents the presence of coronary artery disease, the severity of this disease and any potential stenosis cannot be assessed on this non-gated CT examination. Assessment for potential risk factor modification, dietary therapy or pharmacologic therapy may be warranted, if clinically indicated.   Electronically Signed   By: Corlis Leak M.D.   On: 04/08/2015 09:49   Ct Abdomen Pelvis W Contrast  04/02/2015   CLINICAL DATA:  Nonspecific abdominal pain. Status post recent laparotomy for  small bowel obstruction.  EXAM: CT ABDOMEN AND PELVIS WITH CONTRAST  TECHNIQUE: Multidetector CT imaging of the abdomen and pelvis was performed using the standard protocol following bolus administration of intravenous contrast.  CONTRAST:  50mL OMNIPAQUE IOHEXOL 300 MG/ML SOLN, OMNIPAQUE IOHEXOL 300 MG/ML SOLN  COMPARISON:  Renal ultrasound dated 03/25/2015 and abdomen and pelvis CT dated 03/18/2015.  FINDINGS: Cholecystectomy clips. Interval midline surgical scar in the skin clips at the level of the pelvis. Loculated fluid with peripheral rim enhancement beneath the left hemidiaphragm and partially surrounding the spleen. This measures 7.7 x 5.3 cm on axial image 16. This measures 7.7 cm in length on coronal image number 92.  There is an additional fluid collection in the lateral aspect of the left mid abdomen, measuring 8.3 x 4.2 cm on axial image number 31. This also has peripheral rim enhancement. This measures 9.8 cm in length on coronal image number 64.  There are dilated small bowel loops in the lower abdomen and upper pelvis. Above the urinary bladder, one of these loops contains fluid, small amount of oral contrast and some gas. No definite separate fluid collection is seen.  A Foley catheter is in the urinary bladder with associated air in the bladder. No free peritoneal air seen. Atheromatous arterial calcifications, including the coronary arteries. Cholecystectomy clips.  Small left pleural effusion. Bilateral lower lobe atelectasis. Multiple colonic diverticula. Small left inguinal hernia containing fat. Diffuse subcutaneous edema. Lumbar and lower thoracic spine degenerative changes and scoliosis.  IMPRESSION: 1. 7.7 x 7.7 x 5.3 cm fluid collection with rim enhancement beneath the left hemidiaphragm and partially surrounding  the spleen. This is suspicious for an abscess. 2. 9.8 x 8.3 x 4.2 cm arm left mid abdominal fluid collection with peripheral rim enhancement, suspicious for an abscess. 3.  Focal ileus or partial obstruction involving small bowel in the inferior abdomen and upper pelvis. 4. Small left pleural effusion. 5. Bilateral lower lobe atelectasis. 6. Colonic diverticulosis.   Electronically Signed   By: Beckie Salts M.D.   On: 04/02/2015 17:38   Ct Abdomen Pelvis W Contrast  03/18/2015   CLINICAL DATA:  Nausea and vomiting for 4 days, anxiety, status post hysterectomy CT pelvis 05/27/2014  EXAM: CT ABDOMEN AND PELVIS WITH CONTRAST  TECHNIQUE: Multidetector CT imaging of the abdomen and pelvis was performed using the standard protocol following bolus administration of intravenous contrast.  CONTRAST:  50mL OMNIPAQUE IOHEXOL 300 MG/ML SOLN, OMNIPAQUE IOHEXOL 300 MG/ML SOLN  COMPARISON:  None.  FINDINGS: Sagittal images of the spine shows diffuse osteopenia. Degenerative changes thoracolumbar spine. The lung bases are unremarkable.  The patient is status post cholecystectomy. Mild intrahepatic biliary ductal dilatation. There is CBD dilatation up to 1.1 cm. No focal hepatic mass. The pancreas, spleen and adrenal glands are unremarkable. Kidneys are symmetrical in size and enhancement. No hydronephrosis or hydroureter. Delayed renal images shows bilateral renal symmetrical excretion.  Atherosclerotic calcifications of abdominal aorta and iliac arteries. Moderate stool noted in right colon and proximal transverse colon. No colonic obstruction. The descending colon and sigmoid colon are empty collapsed. Some stool noted within rectum.  There are fluid distended small bowel loops in mid upper abdomen and left lower abdomen and pelvis. Small amount of fluid/stranding noted a adjacent to small bowel loops in left lower quadrant see axial image 57. Distal small bowel is decreased caliber collapsed. Findings are consistent with small bowel obstruction.  Mild distended urinary bladder. The patient is status post hysterectomy. The terminal ileum is small caliber decompressed. Contrast material is  noted within stomach. No any contrast material is noted within small bowel or colon.  IMPRESSION: 1. There are fluid distended small bowel loops with multiple air-fluid levels highly suspicious for small bowel obstruction. Distal small bowel is small caliber decompressed. Terminal ileum is small caliber. Small amount of fluid/stranding noted adjacent to small bowel loops in left lower quadrant please see images 49 and 57. 2. Moderate stool noted in right colon and proximal transverse colon. Descending colon and sigmoid colon are empty collapsed. 3. No pericecal inflammation. 4. No hydronephrosis or hydroureter. 5. Status post cholecystectomy. Mild intrahepatic biliary ductal dilatation. CBD dilatation up to 1.1 cm. 6. No hydronephrosis or hydroureter. 7. Status post hysterectomy. These results were called by telephone at the time of interpretation on 03/18/2015 at 8:59 am to Dr. Gerhard Munch , who verbally acknowledged these results.   Electronically Signed   By: Natasha Mead M.D.   On: 03/18/2015 08:59   US Renal  03/25/2015   CLINICAL DATA:  Acute renal failure.  EXAM: RENAL / URINARY TRACT ULTRASOUND COMPLETE  COMPARISON:  None.  FINDINGS: Right Kidney:  Length: 8.1 cm. Increased echogenicity consistent with chronic medical renal disease. No mass or hydronephrosis visualized.  Left Kidney:  Length: 9.3 cm. Increased echogenicity consistent chronic medical renal disease. No mass or hydronephrosis visualized.  Bladder:  Bladder decompressed by Foley catheter.  IMPRESSION: Bilateral echodense kidneys consistent chronic medical renal disease. No acute abnormality. No hydronephrosis or bladder distention.   Electronically Signed   By: Maisie Fus  Register   On: 03/25/2015 13:27   Dg  Chest Port 1 View  04/12/2015   CLINICAL DATA:  PICC line placement  EXAM: PORTABLE CHEST - 1 VIEW  COMPARISON:  04/06/2015  FINDINGS: Cardiac shadow is stable. Bibasilar atelectasis is again seen. A left-sided PICC line is again noted  with the catheter tip at the junction of the innominate veins directed towards the right innominate vein. The overall appearance is stable from the prior exam. Multiple left upper quadrant drains are seen.  IMPRESSION: Left-sided PICC line as described.   Electronically Signed   By: Alcide Clever M.D.   On: 04/12/2015 22:00   Dg Chest Port 1 View  03/27/2015   CLINICAL DATA:  Left PICC placement. Nausea and vomiting. Initial encounter.  EXAM: PORTABLE CHEST - 1 VIEW  COMPARISON:  Chest radiograph performed 03/26/2015  FINDINGS: The patient's left PICC is noted ending about the proximal SVC. An enteric tube is noted extending below the diaphragm.  The lungs are hypoexpanded. Bibasilar and right upper lung zone airspace opacities may reflect atelectasis or pneumonia. No pleural effusion or pneumothorax is seen  The cardiomediastinal silhouette is borderline normal in size. No acute osseous abnormalities are identified.  IMPRESSION: 1. Left PICC noted ending about the proximal SVC. 2. Lungs hypoexpanded. Bibasilar and right upper lung zone airspace opacities may reflect atelectasis or pneumonia.  These results were called by telephone at the time of interpretation on 03/27/2015 at 7:33 pm to Nursing in the [facility], who verbally acknowledged these results.   Electronically Signed   By: Roanna Raider M.D.   On: 03/27/2015 19:34   Dg Chest Port 1 View  03/26/2015   CLINICAL DATA:  Atelectasis  EXAM: PORTABLE CHEST - 1 VIEW  COMPARISON:  Yesterday  FINDINGS: NG tube and left PICC are stable. Upper normal heart size. Low volumes. Bibasilar atelectasis is not significantly changed. No pneumothorax.  IMPRESSION: Stable bibasilar atelectasis.   Electronically Signed   By: Jolaine Click M.D.   On: 03/26/2015 08:14   Dg Chest Port 1 View  03/25/2015   CLINICAL DATA:  Tachycardia and hypotension; concern for sepsis  EXAM: PORTABLE CHEST - 1 VIEW  COMPARISON:  May 27, 2014  FINDINGS: There is patchy  atelectasis in both lung bases. The degree of inspiration is shallow. There is no frank airspace consolidation. Heart is upper normal in size with pulmonary vascularity within normal limits.  Central catheter tip is in the superior vena cava. Nasogastric tube tip and side port are in the distal stomach. No pneumothorax.  IMPRESSION: Bibasilar atelectasis. No pneumothorax. Degree of inspiration shallow.   Electronically Signed   By: Bretta Bang III M.D.   On: 03/25/2015 09:03   Dg Abd Portable 1v  03/24/2015   CLINICAL DATA:  Small-bowel obstruction  EXAM: PORTABLE ABDOMEN - 1 VIEW  COMPARISON:  03/23/2015  FINDINGS: NG tube is been placed with the tip in the gastric antrum  Small bowel dilatation shows mild interval improvement. Colon is decompressed with moderate stool in the right colon. Surgical clips in the gallbladder fossa.  IMPRESSION: Mild improvement in small bowel obstruction. NG tube in the gastric antrum.   Electronically Signed   By: Marlan Palau M.D.   On: 03/24/2015 08:25   Dg Abd Portable 1v  03/23/2015   CLINICAL DATA:  Followup small bowel obstruction.  EXAM: PORTABLE ABDOMEN - 1 VIEW  COMPARISON:  03/22/2015  FINDINGS: Dilated loops small bowel are similar to the prior exam allowing for differences in radiographic technique different  degrees of magnification.  There is a small amount of air in a nondistended colon.  IMPRESSION: 1. Persistent high-grade partial small bowel obstruction. No significant change from the previous day's study.   Electronically Signed   By: Amie Portland M.D.   On: 03/23/2015 10:01   Dg Abd Portable 1v  03/22/2015   CLINICAL DATA:  81 year old female with recent small bowel obstruction. Initial encounter.  EXAM: PORTABLE ABDOMEN - 1 VIEW  COMPARISON:  03/21/2015 and earlier, including CT Abdomen and Pelvis 03/18/2015.  FINDINGS: Portable AP supine view at 0517 hrs. Enteric tube has been removed. Recurrent dilated gas-filled small bowel loops in the mid  abdomen up to 41 mm diameter. Abundant stool now at the splenic flexure. Decreased distal colon gas in the pelvis. Stable cholecystectomy clips. Stable abdominal and pelvic visceral contours. Grossly stable lung bases. Stable visualized osseous structures.  IMPRESSION: NG tube removed with recurrent small bowel obstruction.   Electronically Signed   By: Odessa Fleming M.D.   On: 03/22/2015 07:42   Dg Abd Portable 1v  03/21/2015   CLINICAL DATA:  Small-bowel obstruction .  EXAM: PORTABLE ABDOMEN - 1 VIEW  COMPARISON:  03/20/2015 .  FINDINGS: NG tube noted with tip in the upper portion stomach. Slight advancement should be considered . Surgical clips right upper quadrant Soft tissue structures are unremarkable. Small-bowel distention has improved. No free air. Stool noted throughout the colon. No acute bony abnormality.  IMPRESSION: 1. NG tube noted with tip in the upper portion of the stomach. Advancement suggested.  2. Interim improvement of small bowel distention. Colonic gas pattern is normal with stool throughout the colon.  These results will be called to the ordering clinician or representative by the Radiologist Assistant, and communication documented in the PACS or zVision Dashboard.   Electronically Signed   By: Maisie Fus  Register   On: 03/21/2015 07:32   Dg Abd Portable 1v  03/20/2015   CLINICAL DATA:  Followup small bowel obstruction.  EXAM: PORTABLE ABDOMEN - 1 VIEW  COMPARISON:  03/19/2015  FINDINGS: Mild small bowel dilation persists most evident in the left upper quadrant. There has been no substantial change from the prior study. Residual contrast is noted in the bladder, also similar to the prior exam. Nasogastric tube tip projects in the distal stomach.  IMPRESSION: Small bowel dilation persists consistent with a persistent partial small bowel obstruction. No change from the previous day's study.   Electronically Signed   By: Amie Portland M.D.   On: 03/20/2015 07:29   Dg Abd Portable 1v  03/19/2015    CLINICAL DATA:  Small bowel obstruction protocol. Small bowel obstruction.  EXAM: PORTABLE ABDOMEN - 1 VIEW  COMPARISON:  03/18/2015.  FINDINGS: This is a 24 hr film. There is no contrast identified in the cecum. On the prior exam at 2046 hr yesterday, oral contrast was present in the stomach. Excreted contrast is present within the urinary bladder. Dilation of small bowel loops measures 36 mm. Cholecystectomy clips are present in the right upper quadrant.  There is less small bowel contrast than expected which may be due to dilution in the proximal small bowel. Nasogastric tube remains in the stomach with the tip at the antrum.  Large amount of stool is present in the colon.  IMPRESSION: Small bowel obstruction with no contrast identified in the cecum at 24 hr.   Electronically Signed   By: Andreas Newport M.D.   On: 03/19/2015 17:11   Dg Abd Portable 1v-small  Bowel Obstruction Protocol-initial, 8 Hr Delay  03/19/2015   CLINICAL DATA:  Small bowel obstruction  EXAM: PORTABLE ABDOMEN - 1 VIEW  COMPARISON:  Radiographs and CT 03/18/2015  FINDINGS: Nasogastric tube extends into the distal stomach. There is contrast in the gastric lumen. There is no significant contrast in the small bowel or colon. Dilated stacked loops of small bowel persist in the mid abdomen, probably unchanged. No free air is evident on this single AP supine portable radiograph.  IMPRESSION: Enteric contrast has not reached the colon. Persistent small bowel dilatation consistent with small-bowel obstruction.   Electronically Signed   By: Ellery Plunk M.D.   On: 03/19/2015 01:48   Dg Abd Portable 1v-small Bowel Protocol-position Verification  03/18/2015   CLINICAL DATA:  Small bowel protocol, nasogastric tube placement  EXAM: PORTABLE ABDOMEN - 1 VIEW  COMPARISON:  Portable exam 1303 hours compared to CT abdomen and pelvis 03/18/2015  FINDINGS: Tip of nasogastric tube projects over distal gastric antrum or duodenal bulb, patient slightly  rotated to the RIGHT.  Excreted contrast material within renal collecting systems and distended urinary bladder.  Air-filled loops of dilated small bowel noted in the mid abdomen suspicious for small bowel obstruction.  Some gas and stool remain present within the colon.  Degenerative disc and facet disease changes thoracolumbar spine.  Osseous demineralization.  IMPRESSION: Tip of nasogastric tube projects over either the distal gastric antrum or the duodenal bulb region.  Dilated small bowel loops question small bowel obstruction.   Electronically Signed   By: Ulyses Southward M.D.   On: 03/18/2015 13:16   Dg Foot 2 Views Right  03/22/2015   CLINICAL DATA:  Dorsal right foot pain for 4 days, no known injury, initial encounter  EXAM: RIGHT FOOT - 2 VIEW  COMPARISON:  None.  FINDINGS: Degenerative changes are noted in the tarsal bones. No acute fracture or dislocation is noted. A small calcaneal spur is noted.  IMPRESSION: Degenerative change without acute abnormality.   Electronically Signed   By: Alcide Clever M.D.   On: 03/22/2015 14:57   Dg Swallowing Func-speech Pathology  04/01/2015    Objective Swallowing Evaluation:    Patient Details  Name: EMANII BUGBEE MRN: [id number] Date of Birth: [birth year]  Today's Date: 04/01/2015 Time: SLP Start Time (ACUTE ONLY): 0835-SLP Stop Time (ACUTE ONLY): 0857 SLP Time Calculation (min) (ACUTE ONLY): 22 min  Past Medical History:  Past Medical History  Diagnosis Date  . CAD (coronary artery disease)     s/p stenting of LAD 1999- cath 5-08 EF normal LAD 30-40% restenosis. D1  50% D2 80% LCX & RCA minimal plaque  . HTN (hypertension)   . Hyperlipemia   . Anemia     iron deficiency  . Depression   . DVT (deep venous thrombosis)   . Gout   . Osteoporosis   . Pancreatitis   . GERD (gastroesophageal reflux disease)   . Renal insufficiency     Cr 1.2-1.3  . Obesity   . Diabetes mellitus   . Polyarthritis     DJD/ possible PMR  . Vitamin D deficiency   . B12 deficiency   . Tinnitus    . Anxiety   . Aneurysm, thoracic aortic   . Constipation   . Urinary frequency   . Vertigo   . Chronic back pain    Past Surgical History:  Past Surgical History  Procedure Laterality Date  . Hemorrhoid surgery    . Abdominal hysterectomy    .  Cholecystectomy    . Tubal ligation    . Coronary angioplasty with stent placement  1999    LAD stent  . Cardiac catheterization  2008    L main 20%, LAD stent patent, D1 50%, D2 80% (small), RCA 20%, EF 55-60%   . Laparoscopy N/A 03/24/2015    Procedure: LAPAROSCOPY DIAGNOSTIC;  Surgeon: Luretha Murphy, MD;   Location: [facility];  Service: General;  Laterality: N/A;  . Laparoscopic lysis of adhesions N/A 03/24/2015    Procedure: LAPAROSCOPIC LYSIS OF ADHESIONS;  Surgeon: Luretha Murphy,  MD;  Location: [facility];  Service: General;  Laterality: N/A;  . Laparotomy N/A 03/24/2015    Procedure: LAPAROTOMY with decompression of bowel;  Surgeon: Luretha Murphy, MD;  Location: [facility];  Service: General;  Laterality: N/A;   HPI:  Other Pertinent Information: Pt is a 81 year old female admitted 03/18/15  with SBO secondary to adhesions. She failed conservative therapy and  underwent laparoscopic lysis of adhesions/laparotomy with bowel  decompression on 03/24/15. Her hospital course was complicated by sepsis  secondary to peritoneal contamination with transfer to the SDU on 03/25/15.  She also developed left-sided facial droop/right arm weakness 03/27/15 with  CT scan showing a 4 mm lacunar infarct in the left lentiform nucleus. MRI  failed to show any abnormality. After removal of NG for suction, MD  ordered MBS prior to initiating diet.   No Data Recorded  Assessment / Plan / Recommendation CHL IP CLINICAL IMPRESSIONS 04/01/2015  Therapy Diagnosis Mild pharyngeal phase dysphagia;Mild oral phase  dysphagia  Clinical Impression Mild oral dysphagia characterized by decreased lingual  formation of bolus, improved with straw sips and improving progressively  during exam. oropharyngeal dysphagia also  mild with no penetration or  aspiration. Suspect mild standing secretions intially during exam as  pharyngeal residuals decreased and timing of swallow improved with each  trial. Though swallow was initially delayed, by end of exam pt taking  large consecutive sips of thin with minimal delay and minimal residuals.  Recommend pt initiate a dys 3 (mechanical soft) diet with basic aspiration  precautions of upright posture and occasional second swallow to clear oral  cavity and orophrayngeal residuals. SLP will f/u for tolerance during  acute stay.       CHL IP TREATMENT RECOMMENDATION 04/01/2015  Treatment Recommendations Therapy as outlined in treatment plan below     CHL IP DIET RECOMMENDATION 04/01/2015  SLP Diet Recommendations Dysphagia 3 (Mech soft);Thin  Liquid Administration via (None)  Medication Administration Whole meds with liquid  Compensations Multiple dry swallows after each bite/sip  Postural Changes and/or Swallow Maneuvers (None)     CHL IP OTHER RECOMMENDATIONS 04/01/2015  Recommended Consults (None)  Oral Care Recommendations Oral care BID  Other Recommendations (None)     CHL IP FOLLOW UP RECOMMENDATIONS 03/29/2015  Follow up Recommendations 24 hour supervision/assistance     CHL IP FREQUENCY AND DURATION 04/01/2015  Speech Therapy Frequency (ACUTE ONLY) min 2x/week  Treatment Duration 2 weeks     Pertinent Vitals/Pain NA    SLP Swallow Goals No flowsheet data found.  No flowsheet data found.    CHL IP REASON FOR REFERRAL 04/01/2015  Reason for Referral Objectively evaluate swallowing function     CHL IP ORAL PHASE 04/01/2015  Lips (None)  Tongue (None)  Mucous membranes (None)  Nutritional status (None)  Other (None)  Oxygen therapy (None)  Oral Phase Impaired  Oral - Pudding Teaspoon (None)  Oral - Pudding Cup (None)  Oral - Honey Teaspoon (None)  Oral - Honey Cup (None)  Oral - Honey Syringe (None)  Oral - Nectar Teaspoon (None)  Oral - Nectar Cup (None)  Oral - Nectar Straw (None)  Oral - Nectar  Syringe (None)  Oral - Ice Chips (None)  Oral - Thin Teaspoon (None)  Oral - Thin Cup (None)  Oral - Thin Straw (None)  Oral - Thin Syringe (None)  Oral - Puree (None)  Oral - Mechanical Soft (None)  Oral - Regular (None)  Oral - Multi-consistency (None)  Oral - Pill (None)  Oral Phase - Comment (None)      CHL IP PHARYNGEAL PHASE 04/01/2015  Pharyngeal Phase Impaired  Pharyngeal - Pudding Teaspoon (None)  Penetration/Aspiration details (pudding teaspoon) (None)  Pharyngeal - Pudding Cup (None)  Penetration/Aspiration details (pudding cup) (None)  Pharyngeal - Honey Teaspoon (None)  Penetration/Aspiration details (honey teaspoon) (None)  Pharyngeal - Honey Cup (None)  Penetration/Aspiration details (honey cup) (None)  Pharyngeal - Honey Syringe (None)  Penetration/Aspiration details (honey syringe) (None)  Pharyngeal - Nectar Teaspoon (None)  Penetration/Aspiration details (nectar teaspoon) (None)  Pharyngeal - Nectar Cup (None)  Penetration/Aspiration details (nectar cup) (None)  Pharyngeal - Nectar Straw (None)  Penetration/Aspiration details (nectar straw) (None)  Pharyngeal - Nectar Syringe (None)  Penetration/Aspiration details (nectar syringe) (None)  Pharyngeal - Ice Chips (None)  Penetration/Aspiration details (ice chips) (None)  Pharyngeal - Thin Teaspoon (None)  Penetration/Aspiration details (thin teaspoon) (None)  Pharyngeal - Thin Cup (None)  Penetration/Aspiration details (thin cup) (None)  Pharyngeal - Thin Straw (None)  Penetration/Aspiration details (thin straw) (None)  Pharyngeal - Thin Syringe (None)  Penetration/Aspiration details (thin syringe') (None)  Pharyngeal - Puree (None)  Penetration/Aspiration details (puree) (None)  Pharyngeal - Mechanical Soft (None)  Penetration/Aspiration details (mechanical soft) (None)  Pharyngeal - Regular (None)  Penetration/Aspiration details (regular) (None)  Pharyngeal - Multi-consistency (None)  Penetration/Aspiration details (multi-consistency) (None)   Pharyngeal - Pill (None)  Penetration/Aspiration details (pill) (None)  Pharyngeal Comment (None)      No flowsheet data found.  No flowsheet data found.        Harlon Ditty, [place] CCC-SLP [phone]  Claudine Mouton 04/01/2015, 10:31 AM    Ct Image Guided Drainage Percut Cath  Peritoneal Retroperit  04/04/2015   CLINICAL DATA:  81 year old with postoperative fluid collections. Concern for intra-abdominal abscesses.  EXAM: CT-GUIDED DRAIN PLACEMENT IN LEFT UPPER ABDOMINAL FLUID COLLECTION  CT-GUIDED DRAIN PLACEMENT IN LEFT LATERAL ABDOMINAL FLUID COLLECTION  Physician: Rachelle Hora. Lowella Dandy, MD  FLUOROSCOPY TIME:  None  MEDICATIONS: 4 mg versed, 100 mcg of fentanyl. A radiology nurse monitored the patient for moderate sedation.  ANESTHESIA/SEDATION: Moderate sedation time: 50 min  PROCEDURE: Informed consent was obtained for CT-guided drain placements. Patient was placed supine on the CT scanner. Images through the abdomen were obtained. The collections in the left upper abdomen and lateral left abdomen were identified. The left side of the abdomen was prepped and draped in sterile fashion. The collection in the left upper abdomen and perisplenic region was initially targeted. Skin was anesthetized with 1% lidocaine. 18 gauge needle was directed into the fluid collection just posterior to the spleen and cloudy yellow fluid was aspirated. A stiff Amplatz wire was placed and the tract was dilated to accommodate a 10.2 [place] multipurpose drain. Catheter sutured to skin and attached to a suction bulb.  Attention was directed to the left lateral abdominal fluid collection. The left lateral abdomen was anesthetized with 1% lidocaine. 18 gauge  needle directed into the fluid collection and cloudy yellow fluid was aspirated. Stiff Amplatz wire was placed and the tract was dilated to accommodate a 10.2 [place] multipurpose drain. Catheter sutured to skin and attached to a suction bulb.  FINDINGS: Left upper abdominal fluid  collection in the perisplenic region. Drain was successfully placed just posterior to the spleen. There is a second collection in the lateral left mid abdomen. Drain successfully placed in this collection.  Estimated blood loss: Minimal  COMPLICATIONS: None  IMPRESSION: Placement of CT-guided drainage catheters in two left abdominal fluid collections. Cloudy yellow fluid was removed from both collections. Samples were sent for culture.   Electronically Signed   By: Richarda Overlie M.D.   On: 04/04/2015 08:32   Ct Image Guided Drainage Percut Cath  Peritoneal Retroperit  04/04/2015   CLINICAL DATA:  81 year old with postoperative fluid collections. Concern for intra-abdominal abscesses.  EXAM: CT-GUIDED DRAIN PLACEMENT IN LEFT UPPER ABDOMINAL FLUID COLLECTION  CT-GUIDED DRAIN PLACEMENT IN LEFT LATERAL ABDOMINAL FLUID COLLECTION  Physician: Rachelle Hora. Lowella Dandy, MD  FLUOROSCOPY TIME:  None  MEDICATIONS: 4 mg versed, 100 mcg of fentanyl. A radiology nurse monitored the patient for moderate sedation.  ANESTHESIA/SEDATION: Moderate sedation time: 50 min  PROCEDURE: Informed consent was obtained for CT-guided drain placements. Patient was placed supine on the CT scanner. Images through the abdomen were obtained. The collections in the left upper abdomen and lateral left abdomen were identified. The left side of the abdomen was prepped and draped in sterile fashion. The collection in the left upper abdomen and perisplenic region was initially targeted. Skin was anesthetized with 1% lidocaine. 18 gauge needle was directed into the fluid collection just posterior to the spleen and cloudy yellow fluid was aspirated. A stiff Amplatz wire was placed and the tract was dilated to accommodate a 10.2 [place] multipurpose drain. Catheter sutured to skin and attached to a suction bulb.  Attention was directed to the left lateral abdominal fluid collection. The left lateral abdomen was anesthetized with 1% lidocaine. 18 gauge needle directed  into the fluid collection and cloudy yellow fluid was aspirated. Stiff Amplatz wire was placed and the tract was dilated to accommodate a 10.2 [place] multipurpose drain. Catheter sutured to skin and attached to a suction bulb.  FINDINGS: Left upper abdominal fluid collection in the perisplenic region. Drain was successfully placed just posterior to the spleen. There is a second collection in the lateral left mid abdomen. Drain successfully placed in this collection.  Estimated blood loss: Minimal  COMPLICATIONS: None  IMPRESSION: Placement of CT-guided drainage catheters in two left abdominal fluid collections. Cloudy yellow fluid was removed from both collections. Samples were sent for culture.   Electronically Signed   By: Richarda Overlie M.D.   On: 04/04/2015 08:32     Medical Consultants:    Surgery - Dr. Glenna Fellows   Cardiology   PCCM  Neurology - Dr. Thana Farr   ID  Anti-Infectives:    Mefoxin 03/25/2015 --> 03/26/2015  Rocephin 03/26/2015 -->   Flagyl 03/26/2015 -->  Subjective:   [name] does not feeling like eatiing or doing anything. Wants to sleep.   Objective:    Filed Vitals:   04/13/15 0256 04/13/15 0320 04/13/15 0448 04/13/15 0545  BP: 143/59 145/71 172/82 142/73  Pulse: 94 93 93 95  Temp: 98.4 F (36.9 C) 98.8 F (37.1 C) 98.7 F (37.1 C) 98.8 F (37.1 C)  TempSrc: Oral Oral Oral Oral  Resp: 18 18  16 16  Height:      Weight:      SpO2: 100% 100% 98% 99%    Intake/Output Summary (Last 24 hours) at 04/13/15 1503 Last data filed at 04/13/15 1400  Gross per 24 hour  Intake 1970.33 ml  Output    445 ml  Net 1525.33 ml    Exam: Gen:  NAD Cardiovascular:  RRR, No M/R/G Respiratory:  Lungs CTAB Gastrointestinal:  Abdomen softly distended,  No tenderness, + BS Extremities:  1+ edema   Data Reviewed:    Labs: Basic Metabolic Panel:  Recent Labs Lab 04/07/15 0454 04/08/15 0430 04/09/15 0503 04/10/15 0500 04/11/15 0447  04/13/15 0851  NA 135 133* 133* 134* 135 134*  K 5.0 4.6 4.4 4.1 3.9 4.9  CL 104 104 104 106 108 107  CO2 22 21* 20* 20* 19* 20*  GLUCOSE 136* 121* 125* 131* 104* 157*  BUN 46* 43* 41* 40* 42* 45*  CREATININE 0.82 0.77 0.70 0.71 0.67 0.68  CALCIUM 8.9 8.9 9.1 9.0 9.2 9.1  MG 1.6* 1.9  --  1.5* 1.9  --   PHOS 4.2  --   --  4.2 4.2  --    GFR Estimated Creatinine Clearance: 53.9 mL/min (by C-G formula based on Cr of 0.68). Liver Function Tests:  Recent Labs Lab 04/07/15 0454 04/09/15 0503  AST 43* 39  ALT 32 31  ALKPHOS 204* 199*  BILITOT 0.4 0.4  PROT 7.0 7.2  ALBUMIN 2.0* 1.9*   Coagulation profile No results for input(s): INR, PROTIME in the last 168 hours.  CBC:  Recent Labs Lab 04/07/15 0454 04/08/15 1213 04/09/15 0503 04/11/15 0447 04/13/15 0851  WBC 13.9* 11.8* 11.7* 8.1 8.4  NEUTROABS 12.4*  --   --  6.2  --   HGB 7.8* 8.5* 7.7* 7.7* 10.2*  HCT 25.8* 27.5* 24.9* 24.4* 31.8*  MCV 82.4 82.8 82.5 81.1 83.7  PLT 506* 605* 538* 618* 541*   Cardiac Enzymes: No results for input(s): CKTOTAL, CKMB, CKMBINDEX, TROPONINI in the last 168 hours. CBG:  Recent Labs Lab 04/12/15 2012 04/13/15 0005 04/13/15 0422 04/13/15 0732 04/13/15 1142  GLUCAP 111* 163* 86 123* 139*   Sepsis Labs:  Recent Labs Lab 04/08/15 1213 04/09/15 0503 04/11/15 0447 04/13/15 0851  WBC 11.8* 11.7* 8.1 8.4   Microbiology Recent Results (from the past 240 hour(s))  Culture, routine-abscess     Status: None   Collection Time: 04/03/15  3:18 PM  Result Value Ref Range Status   Specimen Description ABSCESS LEFT ABDOMEN UPPER  Final   Special Requests NONE  Final   Gram Stain   Final    MODERATE WBC PRESENT,BOTH PMN AND MONONUCLEAR NO SQUAMOUS EPITHELIAL CELLS SEEN NO ORGANISMS SEEN Performed at [facility]    Culture   Final    NO GROWTH 3 DAYS Performed at [facility]    Report Status 04/07/2015 FINAL  Final  Culture, routine-abscess     Status: None    Collection Time: 04/03/15  3:18 PM  Result Value Ref Range Status   Specimen Description ABSCESS ABDOMEN MID  Final   Special Requests NONE  Final   Gram Stain   Final    ABUNDANT WBC PRESENT,BOTH PMN AND MONONUCLEAR NO SQUAMOUS EPITHELIAL CELLS SEEN NO ORGANISMS SEEN Performed at [facility]    Culture   Final    FEW ENTEROCOCCUS SPECIES Performed at [facility]    Report Status 04/07/2015 FINAL  Final  Organism ID, Bacteria ENTEROCOCCUS SPECIES  Final      Susceptibility   Enterococcus species - MIC*    VANCOMYCIN 2 SENSITIVE Sensitive     AMPICILLIN <=2 SENSITIVE Sensitive     * FEW ENTEROCOCCUS SPECIES  Culture, Urine     Status: None   Collection Time: 04/06/15  3:27 PM  Result Value Ref Range Status   Specimen Description URINE, CATHETERIZED  Final   Special Requests NONE  Final   Colony Count   Final    >=100,000 COLONIES/ML Performed at [facility]    Culture   Final    ENTEROBACTER CLOACAE ENTEROCOCCUS SPECIES Performed at [facility]    Report Status 04/09/2015 FINAL  Final   Organism ID, Bacteria ENTEROBACTER CLOACAE  Final   Organism ID, Bacteria ENTEROCOCCUS SPECIES  Final      Susceptibility   Enterobacter cloacae - MIC*    CEFAZOLIN >=64 RESISTANT Resistant     CEFTRIAXONE >=64 RESISTANT Resistant     CIPROFLOXACIN <=0.25 SENSITIVE Sensitive     GENTAMICIN 2 SENSITIVE Sensitive     LEVOFLOXACIN <=0.12 SENSITIVE Sensitive     NITROFURANTOIN 64 INTERMEDIATE Intermediate     TOBRAMYCIN <=1 SENSITIVE Sensitive     TRIMETH/SULFA <=20 SENSITIVE Sensitive     PIP/TAZO >=128 RESISTANT Resistant     * ENTEROBACTER CLOACAE   Enterococcus species - MIC*    AMPICILLIN <=2 SENSITIVE Sensitive     LEVOFLOXACIN 0.5 SENSITIVE Sensitive     NITROFURANTOIN <=16 SENSITIVE Sensitive     VANCOMYCIN 2 SENSITIVE Sensitive     TETRACYCLINE >=16 RESISTANT Resistant     * ENTEROCOCCUS SPECIES  Culture, blood (routine x 2)      Status: None   Collection Time: 04/06/15  4:40 PM  Result Value Ref Range Status   Specimen Description BLOOD RIGHT ARM  Final   Special Requests BOTTLES DRAWN AEROBIC AND ANAEROBIC 10CC  Final   Culture   Final    NO GROWTH 5 DAYS Performed at [facility]    Report Status 04/12/2015 FINAL  Final  Culture, blood (routine x 2)     Status: None   Collection Time: 04/06/15  4:50 PM  Result Value Ref Range Status   Specimen Description BLOOD RIGHT HAND  Final   Special Requests BOTTLES DRAWN AEROBIC AND ANAEROBIC 10CC  Final   Culture   Final    NO GROWTH 5 DAYS Performed at [facility]    Report Status 04/12/2015 FINAL  Final     Medications:   . sodium chloride   Intravenous Once  . antiseptic oral rinse  7 mL Mouth Rinse q12n4p  . aspirin  300 mg Rectal Daily   Or  . aspirin  325 mg Oral Daily  . bisacodyl  10 mg Rectal Daily  . chlorhexidine  15 mL Mouth Rinse BID  . feeding supplement (ENSURE ENLIVE)  237 mL Oral BID BM  . insulin aspart  0-15 Units Subcutaneous 6 times per day  . lip balm  1 application Topical BID  . metoprolol  2.5 mg Intravenous 4 times per day  . polyethylene glycol  17 g Oral BID   Continuous Infusions: . sodium chloride 10 mL/hr (03/29/15 0800)  . Marland KitchenTPN (CLINIMIX-E) Adult 70 mL/hr at 04/12/15 1729   And  . fat emulsion 240 mL (04/12/15 1728)  . Marland KitchenTPN (CLINIMIX-E) Adult     And  . fat emulsion      Time  spent: 25 minutes.   LOS: 26 days   [facility]  [facility] Pager [phone]  If 7PM-7AM, please contact night-coverage at [email or web address], password [email or web address] for any overnight needs.  04/13/2015, 3:03 PM

## 2015-04-13 NOTE — Procedures (Signed)
Successful LUE DL POWER PICC EXCHG  TIP NOW SVC/RA NO COMP STABLE READY FOR USE FULL REPORT IN PACS

## 2015-04-13 NOTE — Progress Notes (Signed)
PARENTERAL NUTRITION CONSULT NOTE - follow up  Pharmacy Consult for TPN Indication: bowel obstruction  Allergies  Allergen Reactions  . Amlodipine Besylate     REACTION: dizzy  . Aspirin   . Atenolol     REACTION: fatigue  . Benazepril     cough  . Benicar [Olmesartan Medoxomil]     HA  . Cozaar     nausea  . Hydrochlorothiazide W-Triamterene     REACTION: dizzy  . Hydrocodone     REACTION: HA  . Hydroxyzine Pamoate   . Iodine   . Lisinopril     REACTION: tired, cough  . Penicillins Itching    tolerates cephalosporins OK  . Pravastatin     myalgias  . Prednisolone     My stomach hurts  . Tramadol Hcl     REACTION: HA    Patient Measurements: Height: _0  (162.6 cm) Weight: 149 lb 4 oz (67.7 kg) IBW/kg (Calculated) : 54.7 Adjusted Body Weight: 57.5kg   Vital Signs: Temp: 98.8 F (37.1 C) (06/01 0545) Temp Source: Oral (06/01 0545) BP: 142/73 mmHg (06/01 0545) Pulse Rate: 95 (06/01 0545) Intake/Output from previous day: 05/31 0701 - 06/01 0700 In: 1750.3 [P.O.:120; I.V.:120; Blood:335; TPN:1155.3] Out: 445 [Urine:400; Drains:45] Intake/Output from this shift: Total I/O In: 70 [P.O.:60] Out: -   Labs:  Recent Labs  04/11/15 0447 04/13/15 0851  WBC 8.1 8.4  HGB 7.7* 10.2*  HCT 24.4* 31.8*  PLT 618* 541*     Recent Labs  04/11/15 0447 04/13/15 0851  NA 135 134*  K 3.9 4.9  CL 108 107  CO2 19* 20*  GLUCOSE 104* 157*  BUN 42* 45*  CREATININE 0.67 0.68  CALCIUM 9.2 9.1  MG 1.9  --   PHOS 4.2  --   PREALBUMIN 15.1*  --   TRIG 88  --    Estimated Creatinine Clearance: 53.9 mL/min (by C-G formula based on Cr of 0.68).    Recent Labs  04/13/15 0005 04/13/15 0422 04/13/15 0732  GLUCAP 163* 86 123*    Insulin Requirements: 9 units SSI on 5/31  Current Nutrition:  Dysphagia 3 diet (start 5/20) - 10-25% meals charted as eaten Ensure BID ordered on 5/20 (took both cans yesterday)  IVF: NS @ 10 ml/hr  Central access: PICC  5/11 TPN start date: 5/11  ASSESSMENT                                                                                                          HPI: 81 year old female with history of hypertension, coronary artery disease, hyperlipidemia, iron deficiency anemia, depression, osteoporosis, mild chronic kidney disease presented to the ED with ongoing nausea for 4 days and diffuse crampy lower abdominal pain since one day prior to admission. Associated poor by mouth intake and generalized weakness. Patient was found to have small bowel obstruction.  Pharmacy consulted to start TPN.  Significant events:  5/12: laparoscopic lysis of adhesions with laparotomy and decompression of bowel 5/13: new AKI 5/19: Remove NGT 5/20 Completed swallow eval - starting  dysphagia diet 5/21: TPN changed to electrolytes free d/t high K+; given kayexalate.  Ate about 10% of lunch 5/22: IR drainage of 2 abd abscesses, drains placed.  TPN to 50 ml/hr 5/24: Thrush noted.  TPN to 40 ml/hr; Surgery wanting to stimulate appetite. 5/27: Minor improvements in appetite; surgery considering PEG if not improving soon 5/29: per CT improvement in fluid collections and drainage is serous - CCS hopeful to remove drains later this week if improvement continues. Lytes added back to TPN and rate titrated back to goal 6/1: Pt reports continued lack of appetite; sensation of fullness in abdomen and bladder without resolution from voiding or BM    Today:   Glucose (goal <150) - CBGs at goal last 24h - range 104-198  Electrolytes - All lytes wnl except CorrCa slightly high, Na borderline low. Mg/Phos wnl from 5/30  Renal -  SCr improved to wnl; CrCl 54  LFTs - Alk phos slightly elevated but stable.  Others wnl (5/23)  TGs - wnl (5/23)  Prealbumin: continues to improve 2.7 > 15.1 (5/30) despite reduction in TPN rate  NUTRITIONAL GOALS                                                                                             RD  recs (5/18): 75-85 g/day protein, 1500-1700 Kcal/day. Clinimix E 5/15 at a goal rate of 33m/hr + 20% fat emulsion at 169mhr to provide: 84 g/day protein, 1672 Kcal/day.  PLAN                                                                                                                         At 1800 today:  Continue Clinimix E 5/15 to goal rate of 70 ml/hr.  Watch K+, as increased sharply overnight and hyperK on 5/21 was the reason TPN was originally changed to plain formula.  Continue 20% fat emulsion at 1065mr.   TPN to contain standard multivitamins and trace elements.  Maintain IVF at [facility] q4h  TPN lab panels on Mondays & Thursdays.  F/u Surgery plans for continued TPN vs PEG placement  DreReuel BoomharmD Pager: [phone]/2016, 9:41 AM

## 2015-04-13 NOTE — Progress Notes (Signed)
[facility] Progress Note  20 Days Post-Op  Subjective: Less pain, but still tender in the upper abdomen.  No N/V, anorexic, doesn't want to eat much.  Son Thayer Ohm at bedside.  Therapies have been following, but limited participation.  Objective: Vital signs in last 24 hours: Temp:  [98.4 F (36.9 C)-99.2 F (37.3 C)] 98.8 F (37.1 C) (06/01 0545) Pulse Rate:  [92-99] 95 (06/01 0545) Resp:  [16-18] 16 (06/01 0545) BP: (140-172)/(59-83) 142/73 mmHg (06/01 0545) SpO2:  [98 %-100 %] 99 % (06/01 0545) Last BM Date: 04/10/15  Intake/Output from previous day: 05/31 0701 - 06/01 0700 In: 1750.3 [P.O.:120; I.V.:120; Blood:335; TPN:1155.3] Out: 445 [Urine:400; Drains:45] Intake/Output this shift: Total I/O In: 180 [P.O.:180] Out: -   PE: Gen:  Alert, NAD, pleasant Abd: Soft, ND, tender in RLQ today, +BS, no HSM, incisions C/D/I with staples removed, some steristrips in place. Drain #1 serous drainage, Drain #2 with cloudy tan/grayish translucent output. Drainge was a total of 22mL/24hr.  Lab Results:   Recent Labs  04/11/15 0447 04/13/15 0851  WBC 8.1 8.4  HGB 7.7* 10.2*  HCT 24.4* 31.8*  PLT 618* 541*   BMET  Recent Labs  04/11/15 0447 04/13/15 0851  NA 135 134*  K 3.9 4.9  CL 108 107  CO2 19* 20*  GLUCOSE 104* 157*  BUN 42* 45*  CREATININE 0.67 0.68  CALCIUM 9.2 9.1   PT/INR No results for input(s): LABPROT, INR in the last 72 hours. CMP     Component Value Date/Time   NA 134* 04/13/2015 0851   K 4.9 04/13/2015 0851   CL 107 04/13/2015 0851   CO2 20* 04/13/2015 0851   GLUCOSE 157* 04/13/2015 0851   BUN 45* 04/13/2015 0851   CREATININE 0.68 04/13/2015 0851   CALCIUM 9.1 04/13/2015 0851   PROT 7.2 04/09/2015 0503   ALBUMIN 1.9* 04/09/2015 0503   AST 39 04/09/2015 0503   ALT 31 04/09/2015 0503   ALKPHOS 199* 04/09/2015 0503   BILITOT 0.4 04/09/2015 0503   GFRNONAA >60 04/13/2015 0851   GFRAA >60 04/13/2015 0851   Lipase      Component Value Date/Time   LIPASE 105* 04/09/2015 0503       Studies/Results: Dg Chest Port 1 View  04/12/2015   CLINICAL DATA:  PICC line placement  EXAM: PORTABLE CHEST - 1 VIEW  COMPARISON:  04/06/2015  FINDINGS: Cardiac shadow is stable. Bibasilar atelectasis is again seen. A left-sided PICC line is again noted with the catheter tip at the junction of the innominate veins directed towards the right innominate vein. The overall appearance is stable from the prior exam. Multiple left upper quadrant drains are seen.  IMPRESSION: Left-sided PICC line as described.   Electronically Signed   By: Alcide Clever M.D.   On: 04/12/2015 22:00    Anti-infectives: Anti-infectives    Start     Dose/Rate Route Frequency Ordered Stop   04/07/15 2000  vancomycin (VANCOCIN) 500 mg in sodium chloride 0.9 % 100 mL IVPB  Status:  Discontinued     500 mg 100 mL/hr over 60 Minutes Intravenous Every 12 hours 04/07/15 1859 04/09/15 1500   04/07/15 1200  fluconazole (DIFLUCAN) IVPB 200 mg  Status:  Discontinued     200 mg 100 mL/hr over 60 Minutes Intravenous Every 24 hours 04/07/15 1120 04/08/15 1535   03/26/15 1600  cefTRIAXone (ROCEPHIN) 2 g in dextrose 5 % 50 mL IVPB - Premix    Comments:  Pharmacy may  adjust dosing strength / duration / interval for maximal efficacy   2 g 100 mL/hr over 30 Minutes Intravenous Every 24 hours 03/26/15 1007 03/30/15 1535   03/26/15 1200  metroNIDAZOLE (FLAGYL) IVPB 500 mg     500 mg 100 mL/hr over 60 Minutes Intravenous Every 6 hours 03/26/15 1007 03/30/15 1850   03/25/15 1600  cefOXitin (MEFOXIN) 1 g in dextrose 5 % 50 mL IVPB  Status:  Discontinued     1 g 100 mL/hr over 30 Minutes Intravenous Every 12 hours 03/25/15 0801 03/26/15 1007   03/25/15 0800  cefOXitin (MEFOXIN) 1 g in dextrose 5 % 50 mL IVPB  Status:  Discontinued     1 g 100 mL/hr over 30 Minutes Intravenous 3 times per day 03/25/15 0752 03/25/15 0758   03/24/15 1700  cefOXitin (MEFOXIN) 1 g in dextrose  5 % 50 mL IVPB     1 g 100 mL/hr over 30 Minutes Intravenous Every 6 hours 03/24/15 1511 03/25/15 0512   03/24/15 0915  cefOXitin (MEFOXIN) 2 g in dextrose 5 % 50 mL IVPB    Comments:  Pharmacy may adjust dosing strength, interval, or rate of medication as needed for optimal therapy for the patient Send with patient on call to the OR.  Anesthesia to complete antibiotic administration <61min prior to incision per [facility].   2 g 100 mL/hr over 30 Minutes Intravenous On call to O.R. 03/24/15 0910 03/24/15 1042       Assessment/Plan POD #20, s/p lap converted to open LOA for SBO, Dr. Daphine Deutscher -SLP following, On D3 diet, but appetite low -Mobilize as able -Orals for pain, limit narcotics -TNP increased back to full dosage, NEEDS FEEDING TUBE since she's not able to get enough calories.  Discussed the the youngest son Thayer Ohm today, who is receptive to the feeding tube.  I've asked him to consult with the rest of the siblings and decide today about possible placement by IR tomorrow.  Placed order for PEG tube by IR.  Hopefully they feel she is amenable to avoid surgical placement. -Abx: Rocephin/Flagyl D5/5 now discontinued -Pulmonary toilet -Surgical drain removed 03/27/15 -Remove staples 04/07/15 -New CT on 04/02/15 shows 2 intra-abdominal fluid collections, s/p IR drainage on 04/03/15 - left upper abdomen & Left lateral abdomen, cultures show few enterococcus. ID recommending stopping Vancomycin and observing off antibiotics -CT on 04/08/15 showed improvement in fluid collections -Repeat CT Thursday ordered -Bowel regimen encouraged  PCM/TPN -Ultimately she needs a feeding tube, her malnutrition is significant and she hasn't been able to increase her PO intake for several days.  IR G tube would be best case scenario.   Leukocytosis -8.4 today, intra-abdominal fluid collections ABL Anemia -Hgb up to 10.2 yesterday, 1 unit pRBC yesterday, 2 units pRBC on 04/01/15 ?CVA 03/27/15 -per neurology  and primary DVT prophylaxis -SCDs, heparin held for thrombocytopenia per primary service, on aspirin     LOS: 26 days    [name] 04/13/2015, 11:45 AM Pager: [phone]

## 2015-04-13 NOTE — Progress Notes (Signed)
CSW following for discharge planning - spoke with patient's daughter, [name] (ph#: [phone]) who informed CSW that they would prefer to take patient home at discharge, though unsure at this time whether that would be possible. Patient is still not medically ready. CSW has completed FL2 & will continue to follow for discharge planning when ready.     Lincoln Maxin, LCSW [facility] Clinical Social Worker cell #: [phone]

## 2015-04-14 ENCOUNTER — Inpatient Hospital Stay (HOSPITAL_COMMUNITY): Payer: Commercial Managed Care - HMO

## 2015-04-14 LAB — GLUCOSE, CAPILLARY
Glucose-Capillary: 144 mg/dL — ABNORMAL HIGH (ref 65–99)
Glucose-Capillary: 133 mg/dL — ABNORMAL HIGH (ref 65–99)
Glucose-Capillary: 165 mg/dL — ABNORMAL HIGH (ref 65–99)
Glucose-Capillary: 82 mg/dL (ref 65–99)
Glucose-Capillary: 175 mg/dL — ABNORMAL HIGH (ref 65–99)

## 2015-04-14 LAB — MAGNESIUM: Magnesium: 1.7 mg/dL (ref 1.7–2.4)

## 2015-04-14 LAB — PHOSPHORUS: Phosphorus: 4.8 mg/dL — ABNORMAL HIGH (ref 2.5–4.6)

## 2015-04-14 MED ORDER — TRACE MINERALS CR-CU-MN-SE-ZN 10-1000-500-60 MCG/ML IV SOLN
INTRAVENOUS | Status: AC
  Administered 2015-04-14: 18:00:00 via INTRAVENOUS
  Filled 2015-04-14: qty 1680

## 2015-04-14 MED ORDER — MAGNESIUM SULFATE IN D5W 10-5 MG/ML-% IV SOLN
1.0000 g | Freq: Once | INTRAVENOUS | Status: AC
  Administered 2015-04-14: 1 g via INTRAVENOUS
  Filled 2015-04-14: qty 100

## 2015-04-14 MED ORDER — HEPARIN SODIUM (PORCINE) 5000 UNIT/ML IJ SOLN
5000.0000 [IU] | Freq: Three times a day (TID) | INTRAMUSCULAR | Status: DC
  Administered 2015-04-15 (×2): 5000 [IU] via SUBCUTANEOUS
  Filled 2015-04-14 (×2): qty 1

## 2015-04-14 MED ORDER — FAT EMULSION 20 % IV EMUL
240.0000 mL | INTRAVENOUS | Status: AC
  Administered 2015-04-14: 240 mL via INTRAVENOUS
  Filled 2015-04-14: qty 250

## 2015-04-14 NOTE — Care Management Note (Signed)
Case Management Note  Patient Details  Name: [name] MRN: [id number] Date of Birth: 03-Nov-1933  Subjective/Objective:                    Action/Plan:   Expected Discharge Date:   (unknown)               Expected Discharge Plan:  Skilled Nursing Facility  In-House Referral:  Clinical Social Work  Discharge planning Services  CM Consult  Post Acute Care Choice:    Choice offered to:     DME Arranged:    DME Agency:     HH Arranged:    HH Agency:     Status of Service:  Completed, signed off  Medicare Important Message Given:  Yes Date Medicare IM Given:  03/21/15 Medicare IM give by:  Aileen Pilot Date Additional Medicare IM Given:  04/05/15 Additional Medicare Important Message give by:  Geni Bers, RN  If discussed at Long Length of Stay Meetings, dates discussed:  04/14/15  Additional Comments:  Geni Bers, RN 04/14/2015, 2:35 PM

## 2015-04-14 NOTE — Progress Notes (Signed)
PARENTERAL NUTRITION CONSULT NOTE - follow up  Pharmacy Consult for TPN Indication: bowel obstruction  Allergies  Allergen Reactions  . Amlodipine Besylate     REACTION: dizzy  . Aspirin   . Atenolol     REACTION: fatigue  . Benazepril     cough  . Benicar [Olmesartan Medoxomil]     HA  . Cozaar     nausea  . Hydrochlorothiazide W-Triamterene     REACTION: dizzy  . Hydrocodone     REACTION: HA  . Hydroxyzine Pamoate   . Iodine   . Lisinopril     REACTION: tired, cough  . Penicillins Itching    tolerates cephalosporins OK  . Pravastatin     myalgias  . Prednisolone     My stomach hurts  . Tramadol Hcl     REACTION: HA    Patient Measurements: Height: 5' 4"  (162.6 cm) Weight: 149 lb 4 oz (67.7 kg) IBW/kg (Calculated) : 54.7 Adjusted Body Weight: 57.5kg   Vital Signs: Temp: 99.3 F (37.4 C) (06/02 0500) Temp Source: Oral (06/02 0500) BP: 152/77 mmHg (06/02 0500) Pulse Rate: 97 (06/02 0500) Intake/Output from previous day: 06/01 0701 - 06/02 0700 In: 4646.7 [P.O.:780; I.V.:230; TPN:3606.7] Out: 15 [Drains:15] Intake/Output from this shift:    Labs:  Recent Labs  04/13/15 0851  WBC 8.4  HGB 10.2*  HCT 31.8*  PLT 541*     Recent Labs  04/13/15 0851 04/14/15 0451  NA 134*  --   K 4.9  --   CL 107  --   CO2 20*  --   GLUCOSE 157*  --   BUN 45*  --   CREATININE 0.68  --   CALCIUM 9.1  --   MG  --  1.7  PHOS  --  4.8*   Estimated Creatinine Clearance: 53.9 mL/min (by C-G formula based on Cr of 0.68).    Recent Labs  04/13/15 2029 04/13/15 2347 04/14/15 0419  GLUCAP 158* 145* 144*    Insulin Requirements: 11 units SSI over the past 24 hours  Current Nutrition:  Dysphagia 3 diet (start 5/20) - 50% meals charted as eaten Ensure BID ordered on 5/20   IVF: NS @ 10 ml/hr  Central access: PICC 5/11 (exchanged on 6/1) TPN start date: 5/11  ASSESSMENT                                                                                                           HPI: 81 year old female with history of hypertension, coronary artery disease, hyperlipidemia, iron deficiency anemia, depression, osteoporosis, mild chronic kidney disease presented to the ED with ongoing nausea for 4 days and diffuse crampy lower abdominal pain since one day prior to admission. Associated poor by mouth intake and generalized weakness. Patient was found to have small bowel obstruction.  Pharmacy consulted to start TPN.  Significant events:  5/12: laparoscopic lysis of adhesions with laparotomy and decompression of bowel 5/13: new AKI 5/19: Remove NGT 5/20 Completed swallow eval - starting dysphagia diet 5/21: TPN changed to electrolytes  free d/t high K+; given kayexalate.  Ate about 10% of lunch 5/22: IR drainage of 2 abd abscesses, drains placed.  TPN to 50 ml/hr 5/24: Thrush noted.  TPN to 40 ml/hr; Surgery wanting to stimulate appetite. 5/27: Minor improvements in appetite; surgery considering PEG if not improving soon 5/29: per CT improvement in fluid collections and drainage is serous - CCS hopeful to remove drains later this week if improvement continues. Lytes added back to TPN and rate titrated back to goal 6/1: Pt reports continued lack of appetite; sensation of fullness in abdomen and bladder without resolution from voiding or BM; PICC exchanged; plan for G-tube when left drains have been removed    Today:   Glucose (goal <150) - CBGs at goal last 24h - range 133-157  Electrolytes - All lytes wnl except CorrCa slightly high, Na borderline low on 6/1. Mag 1.7, phos slightly elevated at 4.8  Renal -  SCr improved to wnl; CrCl 54  LFTs - Alk phos slightly elevated but stable.  Others wnl (5/23)  TGs - wnl (5/23)  Prealbumin: continues to improve 2.7 > 15.1 (5/30) despite reduction in TPN rate  NUTRITIONAL GOALS                                                                                             RD recs (5/18): 75-85 g/day  protein, 1500-1700 Kcal/day. Clinimix E 5/15 at a goal rate of 40m/hr + 20% fat emulsion at 176mhr to provide: 84 g/day protein, 1672 Kcal/day.  PLAN                                                                                                                         1) Magnesium sulfate 1gm IV x1 today 2) At 1800 today:  Continue Clinimix E 5/15 to goal rate of 70 ml/hr.  Continue 20% fat emulsion at 103mr.   TPN to contain standard multivitamins and trace elements.  Maintain IVF at [facility] q4h  TPN lab panels on Mondays & Thursdays.  F/u Surgery plans for continued TPN vs PEG placement  AnhDia SitterharmD, BCPS 04/14/2015 8:19 AM

## 2015-04-14 NOTE — Progress Notes (Signed)
Physical Therapy Treatment Patient Details Name: [name] MRN: [id number] DOB: 1933/02/24 Today's Date: 04/14/2015    History of Present Illness 81 year old female with history of hypertension, coronary artery disease, hyperlipidemia, diabetes and pt s/p lap converted to open LOA for SBO on 5/12, complicated by Left sided facial droop / right arm weakness / acute CVA in left lentiform nucleus however neurologist note states reviewed MRI and no acute changes seen    PT Comments    Progressing slowly with mobility. Able to stand and pivot on today. Continue to recommend SNF.   Follow Up Recommendations  SNF;Supervision/Assistance - 24 hour     Equipment Recommendations  Hospital bed;Wheelchair (measurements PT);Wheelchair cushion (measurements PT);3in1 (PT);Rolling walker with 5" wheels    Recommendations for Other Services OT consult     Precautions / Restrictions Precautions Precautions: Fall Precaution Comments: 2 JP drains Restrictions Weight Bearing Restrictions: No    Mobility  Bed Mobility Overal bed mobility: Needs Assistance Bed Mobility: Supine to Sit   Sidelying to sit: Mod assist;+2 for safety/equipment Supine to sit: Mod assist     General bed mobility comments: Increased time. Assist for trunk and LEs.   Transfers Overall transfer level: Needs assistance Equipment used: Rolling walker (2 wheeled) Transfers: Sit to/from Stand Sit to Stand: From elevated surface;Mod assist;+2 physical assistance;+2 safety/equipment Stand pivot transfers: Mod assist;+2 physical assistance;+2 safety/equipment       General transfer comment: x2. Assist to rise, stabilize, control descent, maneuver with RW. Stood ~20-30 seconds each time. Pt is fearful of falling. Stand pivot from bed to recliner with RW  Ambulation/Gait                 Stairs            Wheelchair Mobility    Modified Rankin (Stroke Patients Only)       Balance    Sitting-balance support: Feet supported Sitting balance-Leahy Scale: Good     Standing balance support: Bilateral upper extremity supported;During functional activity Standing balance-Leahy Scale: Poor Standing balance comment: stood with RW with min A x 2 for safety and cues for postural control.  Tends to flex trunk forward. Feet were blocked initially for sit to stand                    Cognition Arousal/Alertness: Awake/alert Behavior During Therapy: WFL for tasks assessed/performed Overall Cognitive Status: Within Functional Limits for tasks assessed                      Exercises      General Comments        Pertinent Vitals/Pain Pain Assessment: Faces Faces Pain Scale: Hurts even more Pain Location: abdomen Pain Descriptors / Indicators: Sore Pain Intervention(s): Limited activity within patient's tolerance;Monitored during session;Repositioned    Home Living                      Prior Function            PT Goals (current goals can now be found in the care plan section) Acute Rehab PT Goals Patient Stated Goal: get stronger; get up more Progress towards PT goals: Progressing toward goals    Frequency  Min 3X/week    PT Plan Current plan remains appropriate    Co-evaluation PT/OT/SLP Co-Evaluation/Treatment: Yes Reason for Co-Treatment: For patient/therapist safety PT goals addressed during session: Mobility/safety with mobility;Balance;Proper use of DME OT goals addressed during session: ADL's  and self-care     End of Session Equipment Utilized During Treatment: Gait belt Activity Tolerance: Patient limited by fatigue Patient left: in chair;with call bell/phone within reach;with chair alarm set;with family/visitor present     Time: 2500-3704 PT Time Calculation (min) (ACUTE ONLY): 29 min  Charges:  $Therapeutic Activity: 8-22 mins                    G Codes:      Rebeca Alert, MPT Pager: [phone]

## 2015-04-14 NOTE — Progress Notes (Signed)
Occupational Therapy Treatment Patient Details Name: [name] MRN: [id number] DOB: 1933/01/02 Today's Date: 04/14/2015    History of present illness 81 year old female with history of hypertension, coronary artery disease, hyperlipidemia, diabetes and pt s/p lap converted to open LOA for SBO on 5/12, complicated by Left sided facial droop / right arm weakness / acute CVA in left lentiform nucleus however neurologist note states reviewed MRI and no acute changes seen   OT comments  Pt is making good progress.  Able to stand and pivot to chair today.  Much brighter affect  Follow Up Recommendations  SNF;Supervision/Assistance - 24 hour    Equipment Recommendations  3 in 1 bedside comode    Recommendations for Other Services      Precautions / Restrictions Precautions Precautions: Fall Precaution Comments: 2 JP drains Restrictions Weight Bearing Restrictions: No       Mobility Bed Mobility       Sidelying to sit: Mod assist;+2 for safety/equipment       General bed mobility comments: pt assisted with moving legs off bed and pushing trunk up  Transfers   Equipment used: Rolling walker (2 wheeled) Transfers: Sit to/from [employer] Sit to Stand: Mod assist;+2 physical assistance Stand pivot transfers: Mod assist;+2 physical assistance       General transfer comment: stood twice    Balance   Sitting-balance support: Feet supported Sitting balance-Leahy Scale: Fair         Standing balance comment: stood with RW with min A x 2 for safety and cues for postural control.  Tends to flex trunk forward. Feet were blocked initially for sit to stand                   ADL Overall ADL's : Needs assistance/impaired     Grooming: Wash/dry hands;Sitting;Set up                                 General ADL Comments: Pt sat at EOB for several minutes and then worked on standing statically for adls, and transferred to Actuary   Behavior During Therapy: [facility] for tasks assessed/performed Overall Cognitive Status: Within Functional Limits for tasks assessed                       Extremity/Trunk Assessment               Exercises     Shoulder Instructions       General Comments      Pertinent Vitals/ Pain       Pain Assessment: Faces Faces Pain Scale: Hurts a little bit Pain Location: abdomen Pain Descriptors / Indicators: Sore Pain Intervention(s): Limited activity within patient's tolerance;Monitored during session;Premedicated before session;Repositioned  Home Living                                          Prior Functioning/Environment              Frequency Min 2X/week     Progress Toward Goals  OT Goals(current goals can now be found in  the care plan section)  Progress towards OT goals:  (goals updated today)  Acute Rehab OT Goals Patient Stated Goal: get stronger; get up more Time For Goal Achievement: 04/28/15 Potential to Achieve Goals: Good ADL Goals Pt Will Perform Grooming: with set-up;sitting (3 tasks) Additional ADL Goal #1: pt will stand for 2 minutes for adls with min A Additional ADL Goal #2: Pt will perform BSC transfer wtih mod A, SPT with RW Additional ADL Goal #3: discontinued Additional ADL Goal #4: discontinued  Plan      Co-evaluation    PT/OT/SLP Co-Evaluation/Treatment: Yes Reason for Co-Treatment: For patient/therapist safety PT goals addressed during session: Mobility/safety with mobility OT goals addressed during session: ADL's and self-care      End of Session     Activity Tolerance Patient tolerated treatment well   Patient Left in chair;with chair alarm set;with call bell/phone within reach   Nurse Communication          Time: 6073-7106 OT Time Calculation (min): 29 min  Charges: OT General Charges $OT Visit: 1  Procedure OT Treatments $Therapeutic Activity: 8-22 mins  Julene Rahn 04/14/2015, 10:26 AM   Marica Otter, OTR/L [phone] 04/14/2015

## 2015-04-14 NOTE — Progress Notes (Signed)
[facility] Progress Note  21 Days Post-Op  Subjective: Pt frustrated with her lack of progress, but no N/V, abdominal pain improved.  She is not happy about the idea of a feeding tube.  She says "I eat well sometimes and not well other times just like everyone".  The family does not have someone dedicated as health care POA.  His mother says "her mind is right and she can make her own decisions".  Son Thayer Ohm is at bedside.   She constantly c/o pain.    Objective: Vital signs in last 24 hours: Temp:  [99.3 F (37.4 C)-99.6 F (37.6 C)] 99.3 F (37.4 C) (06/02 0500) Pulse Rate:  [96-104] 97 (06/02 0500) Resp:  [16-18] 16 (06/02 0500) BP: (146-162)/(75-85) 152/77 mmHg (06/02 0500) SpO2:  [99 %-100 %] 99 % (06/02 0500) Last BM Date: 04/13/15  Intake/Output from previous day: 06/01 0701 - 06/02 0700 In: 4646.7 [P.O.:780; I.V.:230; TPN:3606.7] Out: 15 [Drains:15] Intake/Output this shift:    PE: Gen:  Alert, NAD, pleasant Abd: Soft, mildly tender, ND, +BS, no HSM, incisions C/D/I with staples removed, some steristrips in place. Drain #1 serous drainage, Drain #2 with seropurulent yellow tan. Drainge was a total of 85mL/24hr.   Lab Results:   Recent Labs  04/13/15 0851  WBC 8.4  HGB 10.2*  HCT 31.8*  PLT 541*   BMET  Recent Labs  04/13/15 0851  NA 134*  K 4.9  CL 107  CO2 20*  GLUCOSE 157*  BUN 45*  CREATININE 0.68  CALCIUM 9.1   PT/INR No results for input(s): LABPROT, INR in the last 72 hours. CMP     Component Value Date/Time   NA 134* 04/13/2015 0851   K 4.9 04/13/2015 0851   CL 107 04/13/2015 0851   CO2 20* 04/13/2015 0851   GLUCOSE 157* 04/13/2015 0851   BUN 45* 04/13/2015 0851   CREATININE 0.68 04/13/2015 0851   CALCIUM 9.1 04/13/2015 0851   PROT 7.2 04/09/2015 0503   ALBUMIN 1.9* 04/09/2015 0503   AST 39 04/09/2015 0503   ALT 31 04/09/2015 0503   ALKPHOS 199* 04/09/2015 0503   BILITOT 0.4 04/09/2015 0503   GFRNONAA >60  04/13/2015 0851   GFRAA >60 04/13/2015 0851   Lipase     Component Value Date/Time   LIPASE 105* 04/09/2015 0503       Studies/Results: Ir Fluoro Guide Cv Line Left  04/13/2015   CLINICAL DATA:  Malpositioned retracted PICC line, receiving TPN  EXAM: LEFT UPPER EXTREMITY POWER PICC LINE EXCHANGE WITH FLUOROSCOPIC GUIDANCE  FLUOROSCOPY TIME:  1 MINUTES 24 SECONDS, 8 MGY  PROCEDURE: The patient was advised of the possible risks and complications and agreed to undergo the procedure. The patient was then brought to the angiographic suite for the procedure.  The left arm was prepped with chlorhexidine, draped in the usual sterile fashion using maximum barrier technique (cap and mask, sterile gown, sterile gloves, large sterile sheet, hand hygiene and cutaneous antisepsis) and infiltrated locally with 1% Lidocaine.  Under sterile conditions, the existing malpositioned left PICC line was retracted and removed. 0.018 guidewire was advanced. Measurements obtained for the appropriate length. Five French sheath inserted. A new 38 cm double-lumen Power PICC line was advanced with the tip position at the SVC RA junction under fluoroscopy. Images obtained for documentation. The catheter was flushed and covered with a sterile dressing.  Catheter length: 38  Complications: None immediate  IMPRESSION: Successful left arm power PICC line exchange and reposition  with fluoroscopic guidance. The catheter is ready for use.   Electronically Signed   By: Judie Petit.  Shick M.D.   On: 04/13/2015 17:21   Dg Chest Port 1 View  04/12/2015   CLINICAL DATA:  PICC line placement  EXAM: PORTABLE CHEST - 1 VIEW  COMPARISON:  04/06/2015  FINDINGS: Cardiac shadow is stable. Bibasilar atelectasis is again seen. A left-sided PICC line is again noted with the catheter tip at the junction of the innominate veins directed towards the right innominate vein. The overall appearance is stable from the prior exam. Multiple left upper quadrant drains are  seen.  IMPRESSION: Left-sided PICC line as described.   Electronically Signed   By: Alcide Clever M.D.   On: 04/12/2015 22:00    Anti-infectives: Anti-infectives    Start     Dose/Rate Route Frequency Ordered Stop   04/07/15 2000  vancomycin (VANCOCIN) 500 mg in sodium chloride 0.9 % 100 mL IVPB  Status:  Discontinued     500 mg 100 mL/hr over 60 Minutes Intravenous Every 12 hours 04/07/15 1859 04/09/15 1500   04/07/15 1200  fluconazole (DIFLUCAN) IVPB 200 mg  Status:  Discontinued     200 mg 100 mL/hr over 60 Minutes Intravenous Every 24 hours 04/07/15 1120 04/08/15 1535   03/26/15 1600  cefTRIAXone (ROCEPHIN) 2 g in dextrose 5 % 50 mL IVPB - Premix    Comments:  Pharmacy may adjust dosing strength / duration / interval for maximal efficacy   2 g 100 mL/hr over 30 Minutes Intravenous Every 24 hours 03/26/15 1007 03/30/15 1535   03/26/15 1200  metroNIDAZOLE (FLAGYL) IVPB 500 mg     500 mg 100 mL/hr over 60 Minutes Intravenous Every 6 hours 03/26/15 1007 03/30/15 1850   03/25/15 1600  cefOXitin (MEFOXIN) 1 g in dextrose 5 % 50 mL IVPB  Status:  Discontinued     1 g 100 mL/hr over 30 Minutes Intravenous Every 12 hours 03/25/15 0801 03/26/15 1007   03/25/15 0800  cefOXitin (MEFOXIN) 1 g in dextrose 5 % 50 mL IVPB  Status:  Discontinued     1 g 100 mL/hr over 30 Minutes Intravenous 3 times per day 03/25/15 0752 03/25/15 0758   03/24/15 1700  cefOXitin (MEFOXIN) 1 g in dextrose 5 % 50 mL IVPB     1 g 100 mL/hr over 30 Minutes Intravenous Every 6 hours 03/24/15 1511 03/25/15 0512   03/24/15 0915  cefOXitin (MEFOXIN) 2 g in dextrose 5 % 50 mL IVPB    Comments:  Pharmacy may adjust dosing strength, interval, or rate of medication as needed for optimal therapy for the patient Send with patient on call to the OR.  Anesthesia to complete antibiotic administration <48min prior to incision per [facility].   2 g 100 mL/hr over 30 Minutes Intravenous On call to O.R. 03/24/15 0910 03/24/15 1042        Assessment/Plan POD #21, s/p lap converted to open LOA for SBO, Dr. Daphine Deutscher -SLP following, On D3 diet, but appetite low -Mobilize as able -Orals for pain, limit narcotics -TNP increased back to full dosage, NEEDS FEEDING TUBE since she's not able to get enough calories. Discussed the the youngest son Thayer Ohm for the last 2 days, It sounds like he and his siblings are receptive to the feeding tube, but their mother currently refuses.  It is unclear as to whether she is competent to make her own decisions.  May need primary service to touch base with psych to  eval her and determine if HCPOA is needed. Ordered social work consult.  IR says hold off on PEG placement until Left sided IR drains are removed.  She is pending  Repeat CT today -Abx: Rocephin/Flagyl D5/5 now discontinued -Pulmonary toilet -Surgical drain removed 03/27/15 -Remove staples 04/07/15 -CT on 04/02/15 shows 2 intra-abdominal fluid collections, s/p IR drainage on 04/03/15 - left upper abdomen & Left lateral abdomen, cultures show few enterococcus. ID recommending stopping Vancomycin and observing off antibiotics -CT on 04/08/15 showed improvement in fluid collections -Repeat CT TODAY  -Bowel regimen encouraged  PCM/TPN -Ultimately she needs a feeding tube, her malnutrition is significant and she hasn't been able to increase her PO intake for several days. IR G tube would be best case scenario to avoid surgery.  Her other options are to d/c TPN and go home one what oral intake she can get in, or continue TPN.   Leukocytosis -8.4 yesterday, intra-abdominal fluid collections ABL Anemia -Hgb up to 10.2 yesterday, 1 unit pRBC yesterday, 2 units pRBC on 04/01/15 ?CVA 03/27/15 -per neurology and primary DVT prophylaxis -SCDs, heparin held for thrombocytopenia per primary service, on aspirin    LOS: 27 days    Nonie Hoyer 04/14/2015, 8:53 AM Pager: [phone]

## 2015-04-14 NOTE — Progress Notes (Addendum)
[facility] PROGRESS NOTE  [name] ZOX:[id number] DOB: 03-Jun-1933 DOA: 03/18/2015 PCP: [name], MD Brief narrative 81 year old female with history of hypertension, coronary artery disease, hyperlipidemia and diabetes admitted on 03/18/2015 the small bowel obstructive secondary to adhesions.   03/24/2015:Failed conservative therapy and underwent laproscopic lysis of adhesions/laparotomy with bowel decompression.  5/13: developed  sepsis secondary to peritoneal contamination requiring transfer to stepdown unit. 5/15: Developed left arm weakness with CT scan showing 4 mm Lachman infarct in the left lentiform nucleus. MRI negative for any abnormality. Seen by neurology and recommended no further workup, as likelihood of stroke was clear. 5/21: Repeat CT of the abdomen done given for clinical progression and ongoing requirement of pain medications, showed 2 large intraperitoneal fluid collections. Patient underwent percutaneous drainage with placement of abscess drain by IR on 5/23. 5/25: Patient became febrile. Started on empiric vancomycin as cultures grew enterococcus. 5/27: Repeat CT showed improvement of abscess. Given patient clinically improving and afebrile for 24 hours and he bodies were discontinued.  Hospital Course prolonged due to ongoing poor by mouth intake and severe malnutrition. Patient also refusing G-tube placement and requiring prolonged T NA for nutrition.   Principal Problem:  Small bowel obstruction s/p exlap/LOA/decompression 03/25/2015 complicated by severe sepsis secondary to peritonitis / abdominal pain now complicated by intra-abdominal abscess/fluid collection - Status post laparoscopic lysis of adhesions/laparotomy with bowel decompression 03/24/15 . Completed abx course. -s/p  percutaneous drains placed by IR 04/03/15 to drain fluid collections in one CT. Repeat CT scan from 5/27 showing improvement. -Patient is refusing G-tube placement for nutrition  and has been maintained on TNA for several days. I was able to convince her today to have a G-tube placed in and answered all her concerns. Patient is very scared on having a G-tube and thinks she will get constipated, having infection or will need it lifelong. Patient was assured and committed that and she would be able to get discharged home soon. -Order for repeat CT of the abdomen. Seen by IR and recommended G-tube placement once abdominal drains have been removed. Discussed with surgery.  Active Problems:  Facial droop / right arm weakness / possible acute CVA left lentiform nucleus - CT and MRI results as outlined above. The echo with normal EF and no wall motion abnormality. Carotid Dopplers with nonsignificant stenosis. On aspirin. No further workup recommended per neurology.   Hypokalemia/hypomagnesemia - Monitor and replace electrolytes as needed.   Acute postoperative blood loss anemia - Status post 2 units of PRBCs 04/01/15. - Continue to monitor hemoglobin and transfuse 1 unit of prbc today.    Diabetes type 2, controlled - Hemoglobin A1c 6.3%.  sliding scale insulin.   Anxiety state/Adjustment disorder with mixed anxiety and depressed mood -She has depressed mood. not motivated on by mouth intake and participating with PT.   Coronary atherosclerosis - Continue aspirin    Acute renal failure secondary to sepsis  - Resolved with IV fluids.   Right foot pain  - Radiographs negative for fracture. Degenerative changes noted.   Polymyalgia rheumatica/Chronic fatigue disorder - followed by PT.   Elevated blood pressure  Will add low-dose metoprolol   Malnutrition of moderate degree - Continue TPN for nutritional support.    Acute delirium -Stable. Avoid benzos and minimize morphine..   Thrombocytopenia - Mild. Likely related to recent sepsis. Will start her back on subcutaneous Lovenox for DVT prophylaxis.  Constipation:  on MiraLAX. Required an enema  on 5/31     DVT  Prophylaxis Cutaneous Lovenox  Code Status: Full. Family Communication: nonebedside Disposition Plan: Home once G tube placed in ( over the weekend if placed in on 6/7)        Consultants:  surgery  PC CM  IR  Audiology  Urology  ID    Procedures:  Left PICC   mechanical ventilation  2D ECHO   carotid doppler  MRI brain  CT abd and pelvis with contrast  Laparoscopy with lysis of adhesions  Abdominal drain placement.  Antibiotics:  Mefoxin 03/25/2015 --> 03/26/2015  Rocephin 03/26/2015 --> 5/23  Flagyl 03/26/2015 -->5/23    HPI/Subjective: Patient seen and examined. Denies abdominal pain but still has poor by mouth intake. Patient tearful about requiring G-tube placement. She was finally convinced to have one placed.  Objective: Filed Vitals:   04/14/15 1314  BP: 149/77  Pulse: 94  Temp: 98.4 F (36.9 C)  Resp: 18    Intake/Output Summary (Last 24 hours) at 04/14/15 1536 Last data filed at 04/14/15 1400  Gross per 24 hour  Intake 3256.66 ml  Output     15 ml  Net 3241.66 ml   Filed Weights   03/18/15 0700 03/25/15 0900 03/27/15 0400  Weight: 63.957 kg (141 lb) 68.7 kg (151 lb 7.3 oz) 67.7 kg (149 lb 4 oz)    Exam:   General:  Elderly thin built female in no acute distress  HEENT: No pallor, moist oral mucosa, supple neck  Chest: Clear to rotation bilaterally, no added sounds  CVS: Normal S1 and S2, no murmurs rub or gallop is on GI: Soft, nondistended, nontender, bowel sounds present, 2 drains on left side of the abdomen draining scanty fluid  Musculoskeletal: Warm, no edema  CNS: Alert and oriented  Data Reviewed: Basic Metabolic Panel:  Recent Labs Lab 04/08/15 0430 04/09/15 0503 04/10/15 0500 04/11/15 0447 04/13/15 0851 04/14/15 0451  NA 133* 133* 134* 135 134*  --   K 4.6 4.4 4.1 3.9 4.9  --   CL 104 104 106 108 107  --   CO2 21* 20* 20* 19* 20*  --   GLUCOSE 121* 125* 131* 104* 157*   --   BUN 43* 41* 40* 42* 45*  --   CREATININE 0.77 0.70 0.71 0.67 0.68  --   CALCIUM 8.9 9.1 9.0 9.2 9.1  --   MG 1.9  --  1.5* 1.9  --  1.7  PHOS  --   --  4.2 4.2  --  4.8*   Liver Function Tests:  Recent Labs Lab 04/09/15 0503  AST 39  ALT 31  ALKPHOS 199*  BILITOT 0.4  PROT 7.2  ALBUMIN 1.9*    Recent Labs Lab 04/09/15 0503  LIPASE 105*   No results for input(s): AMMONIA in the last 168 hours. CBC:  Recent Labs Lab 04/08/15 1213 04/09/15 0503 04/11/15 0447 04/13/15 0851  WBC 11.8* 11.7* 8.1 8.4  NEUTROABS  --   --  6.2  --   HGB 8.5* 7.7* 7.7* 10.2*  HCT 27.5* 24.9* 24.4* 31.8*  MCV 82.8 82.5 81.1 83.7  PLT 605* 538* 618* 541*   Cardiac Enzymes: No results for input(s): CKTOTAL, CKMB, CKMBINDEX, TROPONINI in the last 168 hours. BNP (last 3 results) No results for input(s): BNP in the last 8760 hours.  ProBNP (last 3 results) No results for input(s): PROBNP in the last 8760 hours.  CBG:  Recent Labs Lab 04/13/15 2347 04/14/15 0419 04/14/15 0750 04/14/15 1154 04/14/15 1444  GLUCAP  145* 144* 133* 165* 82    Recent Results (from the past 240 hour(s))  Culture, Urine     Status: None   Collection Time: 04/06/15  3:27 PM  Result Value Ref Range Status   Specimen Description URINE, CATHETERIZED  Final   Special Requests NONE  Final   Colony Count   Final    >=100,000 COLONIES/ML Performed at [facility]    Culture   Final    ENTEROBACTER CLOACAE ENTEROCOCCUS SPECIES Performed at [facility]    Report Status 04/09/2015 FINAL  Final   Organism ID, Bacteria ENTEROBACTER CLOACAE  Final   Organism ID, Bacteria ENTEROCOCCUS SPECIES  Final      Susceptibility   Enterobacter cloacae - MIC*    CEFAZOLIN >=64 RESISTANT Resistant     CEFTRIAXONE >=64 RESISTANT Resistant     CIPROFLOXACIN <=0.25 SENSITIVE Sensitive     GENTAMICIN 2 SENSITIVE Sensitive     LEVOFLOXACIN <=0.12 SENSITIVE Sensitive     NITROFURANTOIN 64 INTERMEDIATE  Intermediate     TOBRAMYCIN <=1 SENSITIVE Sensitive     TRIMETH/SULFA <=20 SENSITIVE Sensitive     PIP/TAZO >=128 RESISTANT Resistant     * ENTEROBACTER CLOACAE   Enterococcus species - MIC*    AMPICILLIN <=2 SENSITIVE Sensitive     LEVOFLOXACIN 0.5 SENSITIVE Sensitive     NITROFURANTOIN <=16 SENSITIVE Sensitive     VANCOMYCIN 2 SENSITIVE Sensitive     TETRACYCLINE >=16 RESISTANT Resistant     * ENTEROCOCCUS SPECIES  Culture, blood (routine x 2)     Status: None   Collection Time: 04/06/15  4:40 PM  Result Value Ref Range Status   Specimen Description BLOOD RIGHT ARM  Final   Special Requests BOTTLES DRAWN AEROBIC AND ANAEROBIC 10CC  Final   Culture   Final    NO GROWTH 5 DAYS Performed at [facility]    Report Status 04/12/2015 FINAL  Final  Culture, blood (routine x 2)     Status: None   Collection Time: 04/06/15  4:50 PM  Result Value Ref Range Status   Specimen Description BLOOD RIGHT HAND  Final   Special Requests BOTTLES DRAWN AEROBIC AND ANAEROBIC 10CC  Final   Culture   Final    NO GROWTH 5 DAYS Performed at [facility]    Report Status 04/12/2015 FINAL  Final     Studies: Ir Fluoro Guide Cv Line Left  04/13/2015   CLINICAL DATA:  Malpositioned retracted PICC line, receiving TPN  EXAM: LEFT UPPER EXTREMITY POWER PICC LINE EXCHANGE WITH FLUOROSCOPIC GUIDANCE  FLUOROSCOPY TIME:  1 MINUTES 24 SECONDS, 8 MGY  PROCEDURE: The patient was advised of the possible risks and complications and agreed to undergo the procedure. The patient was then brought to the angiographic suite for the procedure.  The left arm was prepped with chlorhexidine, draped in the usual sterile fashion using maximum barrier technique (cap and mask, sterile gown, sterile gloves, large sterile sheet, hand hygiene and cutaneous antisepsis) and infiltrated locally with 1% Lidocaine.  Under sterile conditions, the existing malpositioned left PICC line was retracted and removed. 0.018 guidewire  was advanced. Measurements obtained for the appropriate length. Five French sheath inserted. A new 38 cm double-lumen Power PICC line was advanced with the tip position at the SVC RA junction under fluoroscopy. Images obtained for documentation. The catheter was flushed and covered with a sterile dressing.  Catheter length: 38  Complications: None immediate  IMPRESSION: Successful left arm power  PICC line exchange and reposition with fluoroscopic guidance. The catheter is ready for use.   Electronically Signed   By: Judie Petit.  Shick M.D.   On: 04/13/2015 17:21   Dg Chest Port 1 View  04/12/2015   CLINICAL DATA:  PICC line placement  EXAM: PORTABLE CHEST - 1 VIEW  COMPARISON:  04/06/2015  FINDINGS: Cardiac shadow is stable. Bibasilar atelectasis is again seen. A left-sided PICC line is again noted with the catheter tip at the junction of the innominate veins directed towards the right innominate vein. The overall appearance is stable from the prior exam. Multiple left upper quadrant drains are seen.  IMPRESSION: Left-sided PICC line as described.   Electronically Signed   By: Alcide Clever M.D.   On: 04/12/2015 22:00    Scheduled Meds: . sodium chloride   Intravenous Once  . antiseptic oral rinse  7 mL Mouth Rinse q12n4p  . aspirin  300 mg Rectal Daily   Or  . aspirin  325 mg Oral Daily  . bisacodyl  10 mg Rectal Daily  . chlorhexidine  15 mL Mouth Rinse BID  . feeding supplement (ENSURE ENLIVE)  237 mL Oral BID BM  . insulin aspart  0-15 Units Subcutaneous 6 times per day  . lip balm  1 application Topical BID  . metoprolol  2.5 mg Intravenous 4 times per day  . polyethylene glycol  17 g Oral BID   Continuous Infusions: . sodium chloride 10 mL/hr at 04/13/15 1809  . Marland KitchenTPN (CLINIMIX-E) Adult 70 mL/hr at 04/13/15 1755   And  . fat emulsion 240 mL (04/13/15 1755)  . Marland KitchenTPN (CLINIMIX-E) Adult     And  . fat emulsion        Time spent: 25 minutes   Kimbree Casanas  [facility] Pager  [phone]. If 7PM-7AM, please contact night-coverage at [email or web address], password [facility] 04/14/2015, 3:36 PM  LOS: 27 days

## 2015-04-14 NOTE — Care Management Note (Signed)
Case Management Note  Patient Details  Name: [name] MRN: [id number] Date of Birth: 12-25-1932  Subjective/Objective:                    Action/Plan: Plan for GI. DC 1 to 2 days after GI is placed.    Expected Discharge Date:   (unknown)               Expected Discharge Plan:  Skilled Nursing Facility  In-House Referral:  Clinical Social Work  Discharge planning Services  CM Consult  Post Acute Care Choice:    Choice offered to:     DME Arranged:    DME Agency:     HH Arranged:    HH Agency:     Status of Service:  Completed, signed off  Medicare Important Message Given:  Yes Date Medicare IM Given:  03/21/15 Medicare IM give by:  Aileen Pilot Date Additional Medicare IM Given:  04/05/15 Additional Medicare Important Message give by:  Geni Bers, RN  If discussed at Long Length of Stay Meetings, dates discussed:    Additional CommentsGeni Bers, RN 04/14/2015, 2:45 PM

## 2015-04-15 ENCOUNTER — Inpatient Hospital Stay (HOSPITAL_COMMUNITY): Payer: Commercial Managed Care - HMO

## 2015-04-15 ENCOUNTER — Encounter (HOSPITAL_COMMUNITY)
Admission: EM | Disposition: A | Discharge status: Skilled Nursing Facility | Payer: Self-pay | Source: Home / Self Care | Attending: Internal Medicine

## 2015-04-15 DIAGNOSIS — F329 Major depressive disorder, single episode, unspecified: Secondary | ICD-10-CM

## 2015-04-15 LAB — GLUCOSE, CAPILLARY
GLUCOSE-CAPILLARY: 153 mg/dL — AB (ref 65–99)
GLUCOSE-CAPILLARY: 166 mg/dL — AB (ref 65–99)
GLUCOSE-CAPILLARY: 78 mg/dL (ref 65–99)
Glucose-Capillary: 150 mg/dL — ABNORMAL HIGH (ref 65–99)
Glucose-Capillary: 129 mg/dL — ABNORMAL HIGH (ref 65–99)
Glucose-Capillary: 150 mg/dL — ABNORMAL HIGH (ref 65–99)

## 2015-04-15 SURGERY — EGD (ESOPHAGOGASTRODUODENOSCOPY)
Anesthesia: Moderate Sedation

## 2015-04-15 MED ORDER — CEFAZOLIN SODIUM-DEXTROSE 2-3 GM-% IV SOLR
2.0000 g | INTRAVENOUS | Status: AC
  Administered 2015-04-15: 2 g via INTRAVENOUS

## 2015-04-15 MED ORDER — FENTANYL CITRATE (PF) 100 MCG/2ML IJ SOLN
INTRAMUSCULAR | Status: AC
  Filled 2015-04-15: qty 2

## 2015-04-15 MED ORDER — LIDOCAINE HCL 1 % IJ SOLN
INTRAMUSCULAR | Status: AC
  Filled 2015-04-15: qty 20

## 2015-04-15 MED ORDER — TRACE MINERALS CR-CU-MN-SE-ZN 10-1000-500-60 MCG/ML IV SOLN
INTRAVENOUS | Status: AC
  Administered 2015-04-15: 18:00:00 via INTRAVENOUS
  Filled 2015-04-15: qty 1680

## 2015-04-15 MED ORDER — FENTANYL CITRATE (PF) 100 MCG/2ML IJ SOLN
INTRAMUSCULAR | Status: AC | PRN
  Administered 2015-04-15 (×2): 25 ug via INTRAVENOUS

## 2015-04-15 MED ORDER — GLUCAGON HCL RDNA (DIAGNOSTIC) 1 MG IJ SOLR
INTRAMUSCULAR | Status: AC
  Filled 2015-04-15: qty 1

## 2015-04-15 MED ORDER — FAT EMULSION 20 % IV EMUL
240.0000 mL | INTRAVENOUS | Status: AC
  Administered 2015-04-15: 240 mL via INTRAVENOUS
  Filled 2015-04-15: qty 250

## 2015-04-15 MED ORDER — IOHEXOL 300 MG/ML  SOLN
50.0000 mL | Freq: Once | INTRAMUSCULAR | Status: AC | PRN
  Administered 2015-04-15: 25 mL

## 2015-04-15 MED ORDER — MIDAZOLAM HCL 2 MG/2ML IJ SOLN
INTRAMUSCULAR | Status: AC | PRN
  Administered 2015-04-15 (×2): 1 mg via INTRAVENOUS

## 2015-04-15 MED ORDER — CEFAZOLIN SODIUM-DEXTROSE 2-3 GM-% IV SOLR
INTRAVENOUS | Status: AC
  Filled 2015-04-15: qty 50

## 2015-04-15 MED ORDER — HYDRALAZINE HCL 25 MG PO TABS
25.0000 mg | ORAL_TABLET | Freq: Three times a day (TID) | ORAL | Status: DC
  Administered 2015-04-16 – 2015-04-20 (×12): 25 mg via ORAL
  Filled 2015-04-15 (×13): qty 1

## 2015-04-15 MED ORDER — MIRTAZAPINE 15 MG PO TABS
15.0000 mg | ORAL_TABLET | Freq: Every day | ORAL | Status: DC
  Administered 2015-04-16 – 2015-04-18 (×3): 15 mg via ORAL
  Filled 2015-04-15 (×4): qty 1

## 2015-04-15 MED ORDER — MIDAZOLAM HCL 2 MG/2ML IJ SOLN
INTRAMUSCULAR | Status: AC
  Filled 2015-04-15: qty 6

## 2015-04-15 NOTE — Progress Notes (Signed)
Nutrition Follow-up  DOCUMENTATION CODES:  Non-severe (moderate) malnutrition in context of chronic illness  INTERVENTION: - Continue TPN per pharmacy and wean with TF start - Recommend goal TF: Jevity 1.2 @ 55 mL/hr to provide 1584 kcal, 73 grams protein. Initiate @ 15 mL/hr and advance by 10 mL Q8h to reach goal rate. - RD to continue to follow for needs  NUTRITION DIAGNOSIS:  Malnutrition related to chronic illness as evidenced by moderate depletion of body fat, severe depletion of muscle mass. -ongoing  GOAL:  Patient will meet greater than or equal to 90% of their needs -met with TPN  MONITOR:  PO intake, Supplement acceptance, TF tolerance, Weight trends, Labs, I & O's, Other (Comment) (TPN wean)  ASSESSMENT: 81 year old female with history of hypertension, coronary artery disease, hyperlipidemia, iron deficiency anemia, depression, osteoporosis, mild chronic kidney disease presented to the ED with ongoing nausea for 4 days and diffuse crampy lower abdominal pain since one day prior to admission. Associated poor by mouth intake and generalized weakness. Patient was found to have small bowel obstruction.  6/3: - Pt receiving Clinimix E 5/15 @ 70 mL/hr with 20% lipids @ 10 mL/hr which is providing 1673 kcal, 84 grams protein - Family now agreeable to PEG placement after several discussions with medical staff/surgical team - TF recommendations as outlined above. Per rounds this AM, PEG may be able to be placed today - Meeting needs with TPN at this time; TPN to be weaned with TF start  Labs: CBGs: 78-175 mg/dL, Phos: 4.8 mg/dL yesterday.    5/23 - Attempting to wean TPN starting today; 0-10% of meals over the past 3 days 5/26: - Clinimix 5/15 @ 40 mL/hr with 20% lipids @ 10 mL/hr which is providing 1162 kcal, 48 grams protein. PO intake over the past 24 hours in addition to TPN: 1908 kcal, 85.4 grams protein which is meeting needs. - Per notes and discussion with pharmacist  during rounds this AM, possible plan for PEG if PO intakes continue to remain low. He indicates that no medications have been ordered for thrush. Also noted no appetite stimulants ordered; may be beneficial especially if thrush is improving and PEG is not able to be placed. 5/31 - Pt eating 0-25%. Pt now receiving Clinimix E 5/15 @ 70 mL/hr with 20% lipids @ 10 mL/hr which provides 1673 kcal, 84 grams protein. Pt not able to meet needs PO at this time. Per notes, it sounds as though GI functionality has returned (as indicated by ability to have PO diet). Based on these findings, TF preferential to TPN to maintain GI activity and functionality. Should pt need to remain on nutrition support, recommend PEG with TF to meet needs and d/c TPN.   Height:  Ht Readings from Last 1 Encounters:  03/29/15 _0  (1.626 m)    Weight:  Wt Readings from Last 1 Encounters:  03/27/15 149 lb 4 oz (67.7 kg)    Ideal Body Weight:  54.5 kg  Wt Readings from Last 10 Encounters:  03/27/15 149 lb 4 oz (67.7 kg)  12/31/14 145 lb (65.772 kg)  12/07/14 147 lb 8 oz (66.906 kg)  09/23/14 150 lb (68.04 kg)  08/19/14 150 lb (68.04 kg)  07/22/14 153 lb 12 oz (69.741 kg)  06/24/14 156 lb (70.761 kg)  05/26/14 162 lb (73.483 kg)  04/12/14 164 lb (74.39 kg)  03/14/14 172 lb (78.019 kg)    BMI:  Body mass index is 25.61 kg/(m^2).  Estimated Nutritional Needs:  Kcal:  1500-1700  Protein:  75-85g  Fluid:  1.5L/day  Skin:  Wound (see comment) (stage 2 sacral ulcer)  Diet Order:  TPN (CLINIMIX-E) Adult Diet NPO time specified Except for: Other (See Comments) TPN (CLINIMIX-E) Adult  EDUCATION NEEDS:  No education needs identified at this time   Intake/Output Summary (Last 24 hours) at 04/15/15 1154 Last data filed at 04/15/15 0857  Gross per 24 hour  Intake 2778.84 ml  Output     10 ml  Net 2768.84 ml    Last BM:  5/30   Jarome Matin, RD, LDN Inpatient Clinical Dietitian Pager #  [phone] After hours/weekend pager # [phone]

## 2015-04-15 NOTE — Progress Notes (Signed)
PARENTERAL NUTRITION CONSULT NOTE - follow up  Pharmacy Consult for TPN Indication: bowel obstruction  Allergies  Allergen Reactions  . Amlodipine Besylate     REACTION: dizzy  . Aspirin   . Atenolol     REACTION: fatigue  . Benazepril     cough  . Benicar [Olmesartan Medoxomil]     HA  . Cozaar     nausea  . Hydrochlorothiazide W-Triamterene     REACTION: dizzy  . Hydrocodone     REACTION: HA  . Hydroxyzine Pamoate   . Iodine   . Lisinopril     REACTION: tired, cough  . Penicillins Itching    tolerates cephalosporins OK  . Pravastatin     myalgias  . Prednisolone     My stomach hurts  . Tramadol Hcl     REACTION: HA    Patient Measurements: Height: _0  (162.6 cm) Weight: 149 lb 4 oz (67.7 kg) IBW/kg (Calculated) : 54.7 Adjusted Body Weight: 57.5kg   Vital Signs: Temp: 98.9 F (37.2 C) (06/03 0403) Temp Source: Oral (06/03 0403) BP: 144/77 mmHg (06/03 0403) Pulse Rate: 104 (06/03 0403) Intake/Output from previous day: 06/02 0701 - 06/03 0700 In: 2388.8 [P.O.:840; I.V.:170; TPN:1358.8] Out: 10 [Drains:10] Intake/Output from this shift:    Labs:  Recent Labs  04/13/15 0851  WBC 8.4  HGB 10.2*  HCT 31.8*  PLT 541*     Recent Labs  04/13/15 0851 04/14/15 0451  NA 134*  --   K 4.9  --   CL 107  --   CO2 20*  --   GLUCOSE 157*  --   BUN 45*  --   CREATININE 0.68  --   CALCIUM 9.1  --   MG  --  1.7  PHOS  --  4.8*   Estimated Creatinine Clearance: 53.9 mL/min (by C-G formula based on Cr of 0.68).    Recent Labs  04/14/15 2008 04/15/15 0002 04/15/15 0402  GLUCAP 175* 78 150*    Insulin Requirements: 10 units SSI over the past 24 hours  Current Nutrition:  Dysphagia 3 diet (start 5/20) - 10-20% meals charted as eaten--> changed to NPO on 6/2 at 1800 Ensure BID ordered on 5/20   IVF: NS @ 10 ml/hr  Central access: PICC 5/11 (exchanged on 6/1) TPN start date: 5/11  ASSESSMENT                                                                                                           HPI: 81 year old female with history of hypertension, coronary artery disease, hyperlipidemia, iron deficiency anemia, depression, osteoporosis, mild chronic kidney disease presented to the ED with ongoing nausea for 4 days and diffuse crampy lower abdominal pain since one day prior to admission. Associated poor by mouth intake and generalized weakness. Patient was found to have small bowel obstruction.  Pharmacy consulted to start TPN.  Significant events:  5/12: laparoscopic lysis of adhesions with laparotomy and decompression of bowel 5/13: new AKI 5/19: Remove NGT 5/20 Completed swallow eval - starting  dysphagia diet 5/21: TPN changed to electrolytes free d/t high K+; given kayexalate.  Ate about 10% of lunch 5/22: IR drainage of 2 abd abscesses, drains placed.  TPN to 50 ml/hr 5/24: Thrush noted.  TPN to 40 ml/hr; Surgery wanting to stimulate appetite. 5/27: Minor improvements in appetite; surgery considering PEG if not improving soon 5/29: per CT improvement in fluid collections and drainage is serous - CCS hopeful to remove drains later this week if improvement continues. Lytes added back to TPN and rate titrated back to goal 6/1: Pt reports continued lack of appetite; sensation of fullness in abdomen and bladder without resolution from voiding or BM; PICC exchanged; plan for G-tube when left drains have been removed 6/2: diet changed to NPO; abd CT "suspicious for significant ileus or partial small bowel obstruction" 6/3: possible removal of drain today then placement of PEG   Today:   Glucose (goal <150): CBGs 78-175  Electrolytes (6/1)- All lytes wnl except CorrCa slightly high, Na borderline low. Mag 1.7, phos slightly elevated at 4.8  Renal -  SCr improved to wnl; CrCl 54  LFTs - Alk phos slightly elevated but stable.  Others wnl (5/23)  TGs - wnl (5/23)  Prealbumin: continues to improve 2.7 > 15.1 (5/30) despite  reduction in TPN rate  NUTRITIONAL GOALS                                                                                             RD recs (5/18): 75-85 g/day protein, 1500-1700 Kcal/day. Clinimix E 5/15 at a goal rate of 12m/hr + 20% fat emulsion at 159mhr to provide: 84 g/day protein, 1672 Kcal/day.  PLAN                                                                                                                          At 1800 today:  Continue Clinimix E 5/15 to goal rate of 70 ml/hr.  Continue 20% fat emulsion at 107mr.   TPN to contain standard multivitamins and trace elements.  Maintain IVF at [facility] q4h  TPN lab panels on Mondays & Thursdays.  AnhDia SitterharmD, BCPS 04/15/2015 7:37 AM

## 2015-04-15 NOTE — Procedures (Signed)
Interventional Radiology Procedure Note  Procedure:  Percutaneous gastrostomy Findings:  20 Fr bumper retention gastrostomy tube placed with tip in body of stomach. Complications: None immediate Recommendations:  OK to use tube in 24 hours.   Jodi Marble. Fredia Sorrow, M.D Pager:  [phone]

## 2015-04-15 NOTE — Progress Notes (Signed)
[facility] PROGRESS NOTE  [name] ZOX:[id number] DOB: [birth year] DOA: 03/18/2015 PCP: [name], MD Brief narrative 81 year old female with history of hypertension, coronary artery disease, hyperlipidemia and diabetes admitted on 03/18/2015 the small bowel obstructive secondary to adhesions.   03/24/2015:Failed conservative therapy and underwent laproscopic lysis of adhesions/laparotomy with bowel decompression.  5/13: developed  sepsis secondary to peritoneal contamination requiring transfer to stepdown unit. 5/15: Developed left arm weakness with CT scan showing 4 mm Lachman infarct in the left lentiform nucleus. MRI negative for any abnormality. Seen by neurology and recommended no further workup, as likelihood of stroke was clear. 5/21: Repeat CT of the abdomen done given for clinical progression and ongoing requirement of pain medications, showed 2 large intraperitoneal fluid collections. Patient underwent percutaneous drainage with placement of abscess drain by IR on 5/23. 5/25: Patient became febrile. Started on empiric vancomycin as cultures grew enterococcus. 5/27: Repeat CT showed improvement of abscess. Given patient clinically improving and afebrile for 24 hours and he bodies were discontinued.  Hospital Course prolonged due to ongoing poor by mouth intake and severe malnutrition. Patient also refusing G-tube placement and requiring prolonged T NA for nutrition.   Principal Problem:  Small bowel obstruction s/p exlap/LOA/decompression 03/25/2015 complicated by severe sepsis secondary to peritonitis / abdominal pain now complicated by intra-abdominal abscess/fluid collection - Status post laparoscopic lysis of adhesions/laparotomy with bowel decompression 03/24/15 . Completed abx course. -s/p  percutaneous drains placed by IR 04/03/15 to drain fluid collections in one CT.  -improved fluid collection on repeat CT. Drains removed by surgery today. For G tube placement  today.  Active Problems:  Facial droop / right arm weakness / possible acute CVA left lentiform nucleus - CT and MRI results as outlined above. The echo with normal EF and no wall motion abnormality. Carotid Dopplers with nonsignificant stenosis. On aspirin. No further workup recommended per neurology.   Hypokalemia/hypomagnesemia - Monitor and replace electrolytes as needed.   Acute postoperative blood loss anemia - Status post 2 units of PRBCs 04/01/15. -stable   Diabetes type 2, controlled - Hemoglobin A1c 6.3%.  sliding scale insulin.   Anxiety state/Adjustment disorder with mixed anxiety and depressed mood  depressed mood. Poorly motivated for po intake. Will add remeron.   Coronary atherosclerosis - Continue aspirin    Acute renal failure secondary to sepsis  - Resolved with IV fluids.   Right foot pain  - Radiographs negative for fracture. Degenerative changes noted.   Polymyalgia rheumatica/Chronic fatigue disorder - followed by PT.   Elevated blood pressure  Will add low-dose metoprolol   Malnutrition of moderate degree - Continue TPN for nutritional support.    Acute delirium -Stable. Avoid benzos and minimize morphine..   Thrombocytopenia Likely related to recent sepsis. Now resolved.   Constipation:  on MiraLAX. Required an enema on 5/31     DVT Prophylaxis subCutaneous Lovenox  Code Status: Full. Family Communication: nonebedside Disposition Plan: home possibly on 6/6- 6/7 once tolerating G tube and TNA weaned off)        Consultants:  surgery  PC CM  IR  Audiology  Urology  ID    Procedures:  Left PICC   mechanical ventilation  2D ECHO   carotid doppler  MRI brain  CT abd and pelvis with contrast  Laparoscopy with lysis of adhesions  Abdominal drain placement.  Antibiotics:  Mefoxin 03/25/2015 --> 03/26/2015  Rocephin 03/26/2015 --> 5/23  Flagyl 03/26/2015  -->5/23    HPI/Subjective: Patient seen and examined.  No abd pain.   Objective: Filed Vitals:   04/15/15 1355  BP: 161/78  Pulse: 97  Temp: 98.4 F (36.9 C)  Resp: 16    Intake/Output Summary (Last 24 hours) at 04/15/15 1554 Last data filed at 04/15/15 1255  Gross per 24 hour  Intake 2538.84 ml  Output     10 ml  Net 2528.84 ml   Filed Weights   03/18/15 0700 03/25/15 0900 03/27/15 0400  Weight: 63.957 kg (141 lb) 68.7 kg (151 lb 7.3 oz) 67.7 kg (149 lb 4 oz)    Exam:   General:  no acute distress  HEENT: No pallor, moist oral mucosa, supple neck  Chest: Clear to rotation bilaterally, no added sounds  CVS: Normal S1 and S2, no murmurs rub or gallop is on GI: Soft, nondistended, nontender, bowel sounds present, abd drains removed.  Musculoskeletal: Warm, no edema, PICC in place    Data Reviewed: Basic Metabolic Panel:  Recent Labs Lab 04/09/15 0503 04/10/15 0500 04/11/15 0447 04/13/15 0851 04/14/15 0451  NA 133* 134* 135 134*  --   K 4.4 4.1 3.9 4.9  --   CL 104 106 108 107  --   CO2 20* 20* 19* 20*  --   GLUCOSE 125* 131* 104* 157*  --   BUN 41* 40* 42* 45*  --   CREATININE 0.70 0.71 0.67 0.68  --   CALCIUM 9.1 9.0 9.2 9.1  --   MG  --  1.5* 1.9  --  1.7  PHOS  --  4.2 4.2  --  4.8*   Liver Function Tests:  Recent Labs Lab 04/09/15 0503  AST 39  ALT 31  ALKPHOS 199*  BILITOT 0.4  PROT 7.2  ALBUMIN 1.9*    Recent Labs Lab 04/09/15 0503  LIPASE 105*   No results for input(s): AMMONIA in the last 168 hours. CBC:  Recent Labs Lab 04/09/15 0503 04/11/15 0447 04/13/15 0851  WBC 11.7* 8.1 8.4  NEUTROABS  --  6.2  --   HGB 7.7* 7.7* 10.2*  HCT 24.9* 24.4* 31.8*  MCV 82.5 81.1 83.7  PLT 538* 618* 541*   Cardiac Enzymes: No results for input(s): CKTOTAL, CKMB, CKMBINDEX, TROPONINI in the last 168 hours. BNP (last 3 results) No results for input(s): BNP in the last 8760 hours.  ProBNP (last 3 results) No results for  input(s): PROBNP in the last 8760 hours.  CBG:  Recent Labs Lab 04/14/15 2008 04/15/15 0002 04/15/15 0402 04/15/15 0747 04/15/15 1241  GLUCAP 175* 78 150* 129* 150*    Recent Results (from the past 240 hour(s))  Culture, Urine     Status: None   Collection Time: 04/06/15  3:27 PM  Result Value Ref Range Status   Specimen Description URINE, CATHETERIZED  Final   Special Requests NONE  Final   Colony Count   Final    >=100,000 COLONIES/ML Performed at [facility]    Culture   Final    ENTEROBACTER CLOACAE ENTEROCOCCUS SPECIES Performed at [facility]    Report Status 04/09/2015 FINAL  Final   Organism ID, Bacteria ENTEROBACTER CLOACAE  Final   Organism ID, Bacteria ENTEROCOCCUS SPECIES  Final      Susceptibility   Enterobacter cloacae - MIC*    CEFAZOLIN >=64 RESISTANT Resistant     CEFTRIAXONE >=64 RESISTANT Resistant     CIPROFLOXACIN <=0.25 SENSITIVE Sensitive     GENTAMICIN 2 SENSITIVE Sensitive     LEVOFLOXACIN <=0.12 SENSITIVE Sensitive  NITROFURANTOIN 64 INTERMEDIATE Intermediate     TOBRAMYCIN <=1 SENSITIVE Sensitive     TRIMETH/SULFA <=20 SENSITIVE Sensitive     PIP/TAZO >=128 RESISTANT Resistant     * ENTEROBACTER CLOACAE   Enterococcus species - MIC*    AMPICILLIN <=2 SENSITIVE Sensitive     LEVOFLOXACIN 0.5 SENSITIVE Sensitive     NITROFURANTOIN <=16 SENSITIVE Sensitive     VANCOMYCIN 2 SENSITIVE Sensitive     TETRACYCLINE >=16 RESISTANT Resistant     * ENTEROCOCCUS SPECIES  Culture, blood (routine x 2)     Status: None   Collection Time: 04/06/15  4:40 PM  Result Value Ref Range Status   Specimen Description BLOOD RIGHT ARM  Final   Special Requests BOTTLES DRAWN AEROBIC AND ANAEROBIC 10CC  Final   Culture   Final    NO GROWTH 5 DAYS Performed at [facility]    Report Status 04/12/2015 FINAL  Final  Culture, blood (routine x 2)     Status: None   Collection Time: 04/06/15  4:50 PM  Result Value Ref Range Status    Specimen Description BLOOD RIGHT HAND  Final   Special Requests BOTTLES DRAWN AEROBIC AND ANAEROBIC 10CC  Final   Culture   Final    NO GROWTH 5 DAYS Performed at [facility]    Report Status 04/12/2015 FINAL  Final     Studies: Ct Abdomen Pelvis Wo Contrast  04/14/2015   CLINICAL DATA:  Small bowel obstruction, mid abdominal pain, back pain  EXAM: CT ABDOMEN AND PELVIS WITHOUT CONTRAST  TECHNIQUE: Multidetector CT imaging of the abdomen and pelvis was performed following the standard protocol without IV contrast.  COMPARISON:  04/08/2015  FINDINGS: There is mild left basilar atelectasis. A percutaneous drainage catheter is noted in left subphrenic region posteriorly. No residual abscess is noted. A segment percutaneous drainage catheter is noted in left flank. Decrease in fluid collection at the site of catheter measures only 1.9 by 1.9 cm.  The unenhanced liver shows no biliary ductal dilatation. Status post cholecystectomy. Unenhanced pancreas spleen and adrenal glands are unremarkable. Unenhanced kidneys are symmetrical in size. There is no nephrolithiasis. No hydronephrosis or hydroureter. No calcified ureteral calculi are noted.  There are mild distended small bowel loops with air-fluid levels in left abdomen and pelvis. There is no transition point in caliber of small bowel. Findings may be due to ileus or partial small bowel obstruction. Residual contrast material from previous CT scan noted within right colon transverse colon descending colon and rectum.  Scattered colonic diverticula are noted descending colon. Multiple sigmoid colon diverticula. There is no evidence of acute diverticulitis. The urinary bladder is unremarkable.  There are atherosclerotic calcifications of coronary arteries. Atherosclerotic calcifications of abdominal aorta and SMA.  There is no free abdominal air. Sagittal images of the spine shows degenerative changes thoracolumbar spine. The patient is status post  hysterectomy.  IMPRESSION: 1. Again noted percutaneous drain left sub- diaphragmatic just posterior to the spleen. There is no residual abscess at this level. A second drain in left flank is noted. Decreased in size of fluid collection at this level measures only 1.9 x 1.9 cm. 2. No hydronephrosis or hydroureter. 3. There are mild distended small bowel loops in left abdomen and left pelvis with some air-fluid levels. There is no transition point in caliber of small bowel. Findings suspicious for significant ileus or partial small bowel obstruction. 4. Residual contrast material from recent CT scan noted throughout the colon. 5.  No free abdominal air.   Electronically Signed   By: Natasha Mead M.D.   On: 04/14/2015 16:23   Ir Fluoro Guide Cv Line Left  04/13/2015   CLINICAL DATA:  Malpositioned retracted PICC line, receiving TPN  EXAM: LEFT UPPER EXTREMITY POWER PICC LINE EXCHANGE WITH FLUOROSCOPIC GUIDANCE  FLUOROSCOPY TIME:  1 MINUTES 24 SECONDS, 8 MGY  PROCEDURE: The patient was advised of the possible risks and complications and agreed to undergo the procedure. The patient was then brought to the angiographic suite for the procedure.  The left arm was prepped with chlorhexidine, draped in the usual sterile fashion using maximum barrier technique (cap and mask, sterile gown, sterile gloves, large sterile sheet, hand hygiene and cutaneous antisepsis) and infiltrated locally with 1% Lidocaine.  Under sterile conditions, the existing malpositioned left PICC line was retracted and removed. 0.018 guidewire was advanced. Measurements obtained for the appropriate length. Five French sheath inserted. A new 38 cm double-lumen Power PICC line was advanced with the tip position at the SVC RA junction under fluoroscopy. Images obtained for documentation. The catheter was flushed and covered with a sterile dressing.  Catheter length: 38  Complications: None immediate  IMPRESSION: Successful left arm power PICC line exchange  and reposition with fluoroscopic guidance. The catheter is ready for use.   Electronically Signed   By: Judie Petit.  Shick M.D.   On: 04/13/2015 17:21    Scheduled Meds: . sodium chloride   Intravenous Once  . antiseptic oral rinse  7 mL Mouth Rinse q12n4p  . aspirin  300 mg Rectal Daily   Or  . aspirin  325 mg Oral Daily  . bisacodyl  10 mg Rectal Daily  .  ceFAZolin (ANCEF) IV  2 g Intravenous On Call  . chlorhexidine  15 mL Mouth Rinse BID  . feeding supplement (ENSURE ENLIVE)  237 mL Oral BID BM  . insulin aspart  0-15 Units Subcutaneous 6 times per day  . lip balm  1 application Topical BID  . metoprolol  2.5 mg Intravenous 4 times per day  . polyethylene glycol  17 g Oral BID   Continuous Infusions: . sodium chloride 10 mL/hr at 04/13/15 1809  . Marland KitchenTPN (CLINIMIX-E) Adult 70 mL/hr at 04/14/15 1738   And  . fat emulsion 240 mL (04/14/15 1737)  . Marland KitchenTPN (CLINIMIX-E) Adult     And  . fat emulsion        Time spent: 25 minutes   Derrien Anschutz  [facility] Pager [phone]. If 7PM-7AM, please contact night-coverage at [email or web address], password [facility] 04/15/2015, 3:54 PM  LOS: 28 days

## 2015-04-15 NOTE — Progress Notes (Signed)
[facility] Progress Note  22 Days Post-Op  Subjective: Pt still has pain in abdomen, but most of her pain is in her knees/feet.  Agreeable to PEG.  CT back with improved fluid collections.  Tolerating very little PO.  Having flatus and BMs.  Objective: Vital signs in last 24 hours: Temp:  [98.4 F (36.9 C)-98.9 F (37.2 C)] 98.9 F (37.2 C) (06/03 0403) Pulse Rate:  [91-107] 104 (06/03 0403) Resp:  [16-18] 16 (06/03 0403) BP: (144-160)/(77-80) 144/77 mmHg (06/03 0403) SpO2:  [100 %] 100 % (06/03 0403) Last BM Date: 04/13/15  Intake/Output from previous day: 06/02 0701 - 06/03 0700 In: 3018.8 [P.O.:840; I.V.:240; TPN:1918.8] Out: 10 [Drains:10] Intake/Output this shift:    PE: Gen: Alert, NAD, pleasant Abd: Soft, mildly tender, ND, +BS, no HSM, incisions C/D/I with staples removed. Drain #1 serous drainage, Drain #2 with seropurulent yellow tan. Drainge was a total of 13mL/24hr.   Lab Results:   Recent Labs  04/13/15 0851  WBC 8.4  HGB 10.2*  HCT 31.8*  PLT 541*   BMET  Recent Labs  04/13/15 0851  NA 134*  K 4.9  CL 107  CO2 20*  GLUCOSE 157*  BUN 45*  CREATININE 0.68  CALCIUM 9.1   PT/INR No results for input(s): LABPROT, INR in the last 72 hours. CMP     Component Value Date/Time   NA 134* 04/13/2015 0851   K 4.9 04/13/2015 0851   CL 107 04/13/2015 0851   CO2 20* 04/13/2015 0851   GLUCOSE 157* 04/13/2015 0851   BUN 45* 04/13/2015 0851   CREATININE 0.68 04/13/2015 0851   CALCIUM 9.1 04/13/2015 0851   PROT 7.2 04/09/2015 0503   ALBUMIN 1.9* 04/09/2015 0503   AST 39 04/09/2015 0503   ALT 31 04/09/2015 0503   ALKPHOS 199* 04/09/2015 0503   BILITOT 0.4 04/09/2015 0503   GFRNONAA >60 04/13/2015 0851   GFRAA >60 04/13/2015 0851   Lipase     Component Value Date/Time   LIPASE 105* 04/09/2015 0503       Studies/Results: Ct Abdomen Pelvis Wo Contrast  04/14/2015   CLINICAL DATA:  Small bowel obstruction, mid abdominal  pain, back pain  EXAM: CT ABDOMEN AND PELVIS WITHOUT CONTRAST  TECHNIQUE: Multidetector CT imaging of the abdomen and pelvis was performed following the standard protocol without IV contrast.  COMPARISON:  04/08/2015  FINDINGS: There is mild left basilar atelectasis. A percutaneous drainage catheter is noted in left subphrenic region posteriorly. No residual abscess is noted. A segment percutaneous drainage catheter is noted in left flank. Decrease in fluid collection at the site of catheter measures only 1.9 by 1.9 cm.  The unenhanced liver shows no biliary ductal dilatation. Status post cholecystectomy. Unenhanced pancreas spleen and adrenal glands are unremarkable. Unenhanced kidneys are symmetrical in size. There is no nephrolithiasis. No hydronephrosis or hydroureter. No calcified ureteral calculi are noted.  There are mild distended small bowel loops with air-fluid levels in left abdomen and pelvis. There is no transition point in caliber of small bowel. Findings may be due to ileus or partial small bowel obstruction. Residual contrast material from previous CT scan noted within right colon transverse colon descending colon and rectum.  Scattered colonic diverticula are noted descending colon. Multiple sigmoid colon diverticula. There is no evidence of acute diverticulitis. The urinary bladder is unremarkable.  There are atherosclerotic calcifications of coronary arteries. Atherosclerotic calcifications of abdominal aorta and SMA.  There is no free abdominal air. Sagittal images of  the spine shows degenerative changes thoracolumbar spine. The patient is status post hysterectomy.  IMPRESSION: 1. Again noted percutaneous drain left sub- diaphragmatic just posterior to the spleen. There is no residual abscess at this level. A second drain in left flank is noted. Decreased in size of fluid collection at this level measures only 1.9 x 1.9 cm. 2. No hydronephrosis or hydroureter. 3. There are mild distended small  bowel loops in left abdomen and left pelvis with some air-fluid levels. There is no transition point in caliber of small bowel. Findings suspicious for significant ileus or partial small bowel obstruction. 4. Residual contrast material from recent CT scan noted throughout the colon. 5. No free abdominal air.   Electronically Signed   By: Natasha Mead M.D.   On: 04/14/2015 16:23   Ir Fluoro Guide Cv Line Left  04/13/2015   CLINICAL DATA:  Malpositioned retracted PICC line, receiving TPN  EXAM: LEFT UPPER EXTREMITY POWER PICC LINE EXCHANGE WITH FLUOROSCOPIC GUIDANCE  FLUOROSCOPY TIME:  1 MINUTES 24 SECONDS, 8 MGY  PROCEDURE: The patient was advised of the possible risks and complications and agreed to undergo the procedure. The patient was then brought to the angiographic suite for the procedure.  The left arm was prepped with chlorhexidine, draped in the usual sterile fashion using maximum barrier technique (cap and mask, sterile gown, sterile gloves, large sterile sheet, hand hygiene and cutaneous antisepsis) and infiltrated locally with 1% Lidocaine.  Under sterile conditions, the existing malpositioned left PICC line was retracted and removed. 0.018 guidewire was advanced. Measurements obtained for the appropriate length. Five French sheath inserted. A new 38 cm double-lumen Power PICC line was advanced with the tip position at the SVC RA junction under fluoroscopy. Images obtained for documentation. The catheter was flushed and covered with a sterile dressing.  Catheter length: 38  Complications: None immediate  IMPRESSION: Successful left arm power PICC line exchange and reposition with fluoroscopic guidance. The catheter is ready for use.   Electronically Signed   By: Judie Petit.  Shick M.D.   On: 04/13/2015 17:21    Anti-infectives: Anti-infectives    Start     Dose/Rate Route Frequency Ordered Stop   04/07/15 2000  vancomycin (VANCOCIN) 500 mg in sodium chloride 0.9 % 100 mL IVPB  Status:  Discontinued     500  mg 100 mL/hr over 60 Minutes Intravenous Every 12 hours 04/07/15 1859 04/09/15 1500   04/07/15 1200  fluconazole (DIFLUCAN) IVPB 200 mg  Status:  Discontinued     200 mg 100 mL/hr over 60 Minutes Intravenous Every 24 hours 04/07/15 1120 04/08/15 1535   03/26/15 1600  cefTRIAXone (ROCEPHIN) 2 g in dextrose 5 % 50 mL IVPB - Premix    Comments:  Pharmacy may adjust dosing strength / duration / interval for maximal efficacy   2 g 100 mL/hr over 30 Minutes Intravenous Every 24 hours 03/26/15 1007 03/30/15 1535   03/26/15 1200  metroNIDAZOLE (FLAGYL) IVPB 500 mg     500 mg 100 mL/hr over 60 Minutes Intravenous Every 6 hours 03/26/15 1007 03/30/15 1850   03/25/15 1600  cefOXitin (MEFOXIN) 1 g in dextrose 5 % 50 mL IVPB  Status:  Discontinued     1 g 100 mL/hr over 30 Minutes Intravenous Every 12 hours 03/25/15 0801 03/26/15 1007   03/25/15 0800  cefOXitin (MEFOXIN) 1 g in dextrose 5 % 50 mL IVPB  Status:  Discontinued     1 g 100 mL/hr over 30 Minutes Intravenous 3  times per day 03/25/15 0752 03/25/15 0758   03/24/15 1700  cefOXitin (MEFOXIN) 1 g in dextrose 5 % 50 mL IVPB     1 g 100 mL/hr over 30 Minutes Intravenous Every 6 hours 03/24/15 1511 03/25/15 0512   03/24/15 0915  cefOXitin (MEFOXIN) 2 g in dextrose 5 % 50 mL IVPB    Comments:  Pharmacy may adjust dosing strength, interval, or rate of medication as needed for optimal therapy for the patient Send with patient on call to the OR.  Anesthesia to complete antibiotic administration <60min prior to incision per [facility].   2 g 100 mL/hr over 30 Minutes Intravenous On call to O.R. 03/24/15 0910 03/24/15 1042       Assessment/Plan POD #22, s/p lap converted to open LOA for SBO, Dr. Daphine Deutscher -SLP following, On D3 diet, but appetite low -Mobilize as able -Orals for pain, limit narcotics -TNP increased back to full dosage, Pt now agreeable to feeding tube.  Can be placed by IR after abscess drains are removed.  Our surgical opinion is  that the drains can be discontinued today and PEG placed.  Timing per IR. -Abx: Rocephin/Flagyl D5/5 now discontinued -Pulmonary toilet -Surgical drain removed 03/27/15 -Remove staples 04/07/15 -CT on 04/02/15 shows 2 intra-abdominal fluid collections, s/p IR drainage on 04/03/15 - left upper abdomen & Left lateral abdomen, cultures show few enterococcus. ID recommending stopping Vancomycin and observing off antibiotics -CT on 04/08/15 showed improvement in fluid collections -Repeat CT 04/14/15 - showed improvement in fluid collections only 1.9 x 1.9cm remaining collection with minimal output daily. -Bowel regimen encouraged  PCM/TPN -Once feeding tube is in place, d/c TPN and start tube feeding (day after procedure) per dietitians recommendations. Leukocytosis -resolved ABL Anemia -Hgb up to 10.2 04/13/15, 1 unit pRBC 04/12/15, 2 units pRBC on 04/01/15 ?CVA 03/27/15 -per neurology and primary DVT prophylaxis -SCDs, on heparin and aspirin    LOS: 28 days    Nonie Hoyer 04/15/2015, 8:05 AM Pager: [phone]

## 2015-04-15 NOTE — Progress Notes (Signed)
Patient ID: [name], female   DOB: 02-22-1933, 81 y.o.   MRN: [id number]    Referring Physician(s): CCS/TRH  Subjective:  Patient doing fairly well today. Still has some mild abdominal discomfort him a poor appetite and dysphagia. Abdominal drains have been removed. Request now received for percutaneous gastrostomy tube placement secondary to malnutrition.  Allergies: Amlodipine besylate; Aspirin; Atenolol; Benazepril; Benicar; Cozaar; Hydrochlorothiazide w-triamterene; Hydrocodone; Hydroxyzine pamoate; Iodine; Lisinopril; Penicillins; Pravastatin; Prednisolone; and Tramadol hcl  Medications: Prior to Admission medications   Medication Sig Start Date End Date Taking? Authorizing Provider  acetaminophen (TYLENOL) 650 MG CR tablet Take 1,300 mg by mouth every 8 (eight) hours as needed for pain.    Yes Historical Provider, MD  aspirin 325 MG tablet Take 325 mg by mouth daily.   Yes Historical Provider, MD  cholecalciferol (VITAMIN D) 1000 UNITS tablet Take 2 tablets (2,000 Units total) by mouth daily. 04/17/13  Yes Aleksei Plotnikov V, MD  Cyanocobalamin (VITAMIN B-12) 1000 MCG SUBL Place 1 tablet (1,000 mcg total) under the tongue daily. 12/03/13  Yes Aleksei Plotnikov V, MD  furosemide (LASIX) 20 MG tablet Take 1 tablet (20 mg total) by mouth daily as needed for edema. Patient taking differently: Take 20 mg by mouth daily.  03/10/15 03/09/16 Yes Aleksei Plotnikov V, MD  LORazepam (ATIVAN) 1 MG tablet Take 0.5 tablets (0.5 mg total) by mouth every 8 (eight) hours as needed for anxiety. Patient not taking: Reported on 03/18/2015 05/07/13   Bethann Berkshire, MD  metFORMIN (GLUCOPHAGE) 500 MG tablet TAKE 1 TABLET TWICE DAILY  WITH  A  MEAL 08/23/14  Yes Aleksei Plotnikov V, MD  OVER THE COUNTER MEDICATION Apply 1 application topically 2 (two) times daily as needed (dryness). Diabetic Lotion.   Yes Historical Provider, MD  oxybutynin (DITROPAN) 5 MG tablet Take 1 tablet (5 mg total) by mouth 2 (two)  times daily. 11/25/14  Yes Aleksei Plotnikov V, MD  Polyethyl Glycol-Propyl Glycol (SYSTANE OP) Apply 1-2 drops to eye daily as needed (dry eyes).   Yes Historical Provider, MD     Vital Signs: BP 144/77 mmHg  Pulse 104  Temp(Src) 98.9 F (37.2 C) (Oral)  Resp 16  Ht 5\' 4"  (1.626 m)  Wt 149 lb 4 oz (67.7 kg)  BMI 25.61 kg/m2  SpO2 100%  Physical Exam patient awake, alert; slightly diminished breath sounds left base, right clear. Heart with regular rate and rhythm. Abdomen soft, clean midline vertical incision, positive bowel sounds, mild generalized tenderness. puncture sites from previous left abdominal drains clean and dry.  Imaging: Ct Abdomen Pelvis Wo Contrast  04/14/2015   CLINICAL DATA:  Small bowel obstruction, mid abdominal pain, back pain  EXAM: CT ABDOMEN AND PELVIS WITHOUT CONTRAST  TECHNIQUE: Multidetector CT imaging of the abdomen and pelvis was performed following the standard protocol without IV contrast.  COMPARISON:  04/08/2015  FINDINGS: There is mild left basilar atelectasis. A percutaneous drainage catheter is noted in left subphrenic region posteriorly. No residual abscess is noted. A segment percutaneous drainage catheter is noted in left flank. Decrease in fluid collection at the site of catheter measures only 1.9 by 1.9 cm.  The unenhanced liver shows no biliary ductal dilatation. Status post cholecystectomy. Unenhanced pancreas spleen and adrenal glands are unremarkable. Unenhanced kidneys are symmetrical in size. There is no nephrolithiasis. No hydronephrosis or hydroureter. No calcified ureteral calculi are noted.  There are mild distended small bowel loops with air-fluid levels in left abdomen and pelvis. There is no  transition point in caliber of small bowel. Findings may be due to ileus or partial small bowel obstruction. Residual contrast material from previous CT scan noted within right colon transverse colon descending colon and rectum.  Scattered colonic  diverticula are noted descending colon. Multiple sigmoid colon diverticula. There is no evidence of acute diverticulitis. The urinary bladder is unremarkable.  There are atherosclerotic calcifications of coronary arteries. Atherosclerotic calcifications of abdominal aorta and SMA.  There is no free abdominal air. Sagittal images of the spine shows degenerative changes thoracolumbar spine. The patient is status post hysterectomy.  IMPRESSION: 1. Again noted percutaneous drain left sub- diaphragmatic just posterior to the spleen. There is no residual abscess at this level. A second drain in left flank is noted. Decreased in size of fluid collection at this level measures only 1.9 x 1.9 cm. 2. No hydronephrosis or hydroureter. 3. There are mild distended small bowel loops in left abdomen and left pelvis with some air-fluid levels. There is no transition point in caliber of small bowel. Findings suspicious for significant ileus or partial small bowel obstruction. 4. Residual contrast material from recent CT scan noted throughout the colon. 5. No free abdominal air.   Electronically Signed   By: Natasha Mead M.D.   On: 04/14/2015 16:23   Ir Fluoro Guide Cv Line Left  04/13/2015   CLINICAL DATA:  Malpositioned retracted PICC line, receiving TPN  EXAM: LEFT UPPER EXTREMITY POWER PICC LINE EXCHANGE WITH FLUOROSCOPIC GUIDANCE  FLUOROSCOPY TIME:  1 MINUTES 24 SECONDS, 8 MGY  PROCEDURE: The patient was advised of the possible risks and complications and agreed to undergo the procedure. The patient was then brought to the angiographic suite for the procedure.  The left arm was prepped with chlorhexidine, draped in the usual sterile fashion using maximum barrier technique (cap and mask, sterile gown, sterile gloves, large sterile sheet, hand hygiene and cutaneous antisepsis) and infiltrated locally with 1% Lidocaine.  Under sterile conditions, the existing malpositioned left PICC line was retracted and removed. 0.018 guidewire  was advanced. Measurements obtained for the appropriate length. Five French sheath inserted. A new 38 cm double-lumen Power PICC line was advanced with the tip position at the SVC RA junction under fluoroscopy. Images obtained for documentation. The catheter was flushed and covered with a sterile dressing.  Catheter length: 38  Complications: None immediate  IMPRESSION: Successful left arm power PICC line exchange and reposition with fluoroscopic guidance. The catheter is ready for use.   Electronically Signed   By: Judie Petit.  Shick M.D.   On: 04/13/2015 17:21   Dg Chest Port 1 View  04/12/2015   CLINICAL DATA:  PICC line placement  EXAM: PORTABLE CHEST - 1 VIEW  COMPARISON:  04/06/2015  FINDINGS: Cardiac shadow is stable. Bibasilar atelectasis is again seen. A left-sided PICC line is again noted with the catheter tip at the junction of the innominate veins directed towards the right innominate vein. The overall appearance is stable from the prior exam. Multiple left upper quadrant drains are seen.  IMPRESSION: Left-sided PICC line as described.   Electronically Signed   By: Alcide Clever M.D.   On: 04/12/2015 22:00    Labs:  CBC:  Recent Labs  04/08/15 1213 04/09/15 0503 04/11/15 0447 04/13/15 0851  WBC 11.8* 11.7* 8.1 8.4  HGB 8.5* 7.7* 7.7* 10.2*  HCT 27.5* 24.9* 24.4* 31.8*  PLT 605* 538* 618* 541*    COAGS:  Recent Labs  03/27/15 2348 04/03/15 1140  INR 1.06 1.22  APTT 40*  --     BMP:  Recent Labs  04/09/15 0503 04/10/15 0500 04/11/15 0447 04/13/15 0851  NA 133* 134* 135 134*  K 4.4 4.1 3.9 4.9  CL 104 106 108 107  CO2 20* 20* 19* 20*  GLUCOSE 125* 131* 104* 157*  BUN 41* 40* 42* 45*  CALCIUM 9.1 9.0 9.2 9.1  CREATININE 0.70 0.71 0.67 0.68  GFRNONAA >60 >60 >60 >60  GFRAA >60 >60 >60 >60    LIVER FUNCTION TESTS:  Recent Labs  03/31/15 0350 04/04/15 0650 04/07/15 0454 04/09/15 0503  BILITOT 0.4 0.5 0.4 0.4  AST 26 45* 43* 39  ALT 14 26 32 31  ALKPHOS 98  139* 204* 199*  PROT 5.9* 6.8 7.0 7.2  ALBUMIN 1.8* 1.9* 2.0* 1.9*    Assessment and Plan: S/p LOA for SBO 5/13, drainage of left upper/lateral abd fluid collections 5/22; completed treatment for Enterobacter and enterococcal infections of urine and abdominal fluid;  recent follow-up CT abdomen pelvis shows resolution of drained collections. Both drains were removed today. Patient continues to have poor appetite, some dysphagia and moderate malnutrition. Request now received for percutaneous gastrostomy tube placement. She is currently afebrile with normal white blood cell count. Imaging studies have been reviewed by Dr. Fredia Sorrow and patient appears to be an appropriate candidate for gastrostomy tube placement.Risks and benefits discussed with the patient/family including, but not limited to the need for a barium enema during the procedure, bleeding, infection, peritonitis, or damage to adjacent structures. All of the patient's questions were answered, patient is agreeable to proceed. Consent signed and in chart. Procedure planned for later today.     Signed: D. Jeananne Rama 04/15/2015, 12:37 PM   I spent a total of 15 minutes in face to face in clinical consultation/evaluation, greater than 50% of which was counseling/coordinating care for percutaneous gastrostomy tube placement

## 2015-04-16 DIAGNOSIS — R1013 Epigastric pain: Secondary | ICD-10-CM

## 2015-04-16 LAB — GLUCOSE, CAPILLARY
GLUCOSE-CAPILLARY: 92 mg/dL (ref 65–99)
GLUCOSE-CAPILLARY: 140 mg/dL — AB (ref 65–99)
Glucose-Capillary: 129 mg/dL — ABNORMAL HIGH (ref 65–99)
Glucose-Capillary: 108 mg/dL — ABNORMAL HIGH (ref 65–99)

## 2015-04-16 MED ORDER — JEVITY 1.2 CAL PO LIQD
1000.0000 mL | ORAL | Status: DC
  Administered 2015-04-16 – 2015-04-18 (×2): 1000 mL
  Administered 2015-04-19: 45 mL/h
  Administered 2015-04-20: 1000 mL
  Filled 2015-04-16 (×6): qty 1000

## 2015-04-16 MED ORDER — METOPROLOL TARTRATE 1 MG/ML IV SOLN
INTRAVENOUS | Status: AC
  Filled 2015-04-16: qty 5

## 2015-04-16 MED ORDER — TRACE MINERALS CR-CU-MN-SE-ZN 10-1000-500-60 MCG/ML IV SOLN
INTRAVENOUS | Status: DC
  Administered 2015-04-16: 17:00:00 via INTRAVENOUS
  Filled 2015-04-16: qty 1680

## 2015-04-16 MED ORDER — FAT EMULSION 20 % IV EMUL
240.0000 mL | INTRAVENOUS | Status: DC
  Administered 2015-04-16: 240 mL via INTRAVENOUS
  Filled 2015-04-16: qty 250

## 2015-04-16 NOTE — Progress Notes (Signed)
PARENTERAL NUTRITION CONSULT NOTE - follow up  Pharmacy Consult for TPN Indication: bowel obstruction  Allergies  Allergen Reactions  . Amlodipine Besylate     REACTION: dizzy  . Aspirin   . Atenolol     REACTION: fatigue  . Benazepril     cough  . Benicar [Olmesartan Medoxomil]     HA  . Cozaar     nausea  . Hydrochlorothiazide W-Triamterene     REACTION: dizzy  . Hydrocodone     REACTION: HA  . Hydroxyzine Pamoate   . Iodine   . Lisinopril     REACTION: tired, cough  . Penicillins Itching    tolerates cephalosporins OK  . Pravastatin     myalgias  . Prednisolone     My stomach hurts  . Tramadol Hcl     REACTION: HA   Patient Measurements: Height: 5' 4"  (162.6 cm) Weight: 149 lb 4 oz (67.7 kg) IBW/kg (Calculated) : 54.7 Adjusted Body Weight: 57.5kg  Vital Signs: Temp: 98.9 F (37.2 C) (06/04 0434) Temp Source: Axillary (06/04 0434) BP: 157/72 mmHg (06/04 0434) Pulse Rate: 100 (06/04 0434) Intake/Output from previous day:   Intake/Output from this shift:   Labs:  Recent Labs  04/13/15 0851  WBC 8.4  HGB 10.2*  HCT 31.8*  PLT 541*     Recent Labs  04/13/15 0851 04/14/15 0451  NA 134*  --   K 4.9  --   CL 107  --   CO2 20*  --   GLUCOSE 157*  --   BUN 45*  --   CREATININE 0.68  --   CALCIUM 9.1  --   MG  --  1.7  PHOS  --  4.8*   Estimated Creatinine Clearance: 53.9 mL/min (by C-G formula based on Cr of 0.68).    Recent Labs  04/15/15 2353 04/16/15 0400 04/16/15 0750  GLUCAP 153* 92 129*   Insulin Requirements: 8 units SSI over the past 24 hours  Current Nutrition:  Dysphagia 3 diet (start 5/20) - 10-20% meals charted as eaten--> changed to NPO on 6/2 at 1800 Ensure BID ordered on 5/20 - refused 6/2 & 6/3  IVF: NS @ 10 ml/hr  Central access: PICC 5/11 (exchanged on 6/1) TPN start date: 5/11  ASSESSMENT                                                                                                          HPI:  81 year old female with history of hypertension, coronary artery disease, hyperlipidemia, iron deficiency anemia, depression, osteoporosis, mild chronic kidney disease presented to the ED with ongoing nausea for 4 days and diffuse crampy lower abdominal pain since one day prior to admission. Associated poor by mouth intake and generalized weakness. Patient was found to have small bowel obstruction.  Pharmacy consulted to start TPN.  Significant events:  5/12: laparoscopic lysis of adhesions with laparotomy and decompression of bowel 5/13: new AKI 5/19: Remove NGT 5/20 Completed swallow eval - starting dysphagia diet 5/21: TPN changed to electrolytes free d/t high K+;  given kayexalate.  Ate about 10% of lunch 5/22: IR drainage of 2 abd abscesses, drains placed.  TPN to 50 ml/hr 5/24: Thrush noted.  TPN to 40 ml/hr; Surgery wanting to stimulate appetite. 5/27: Minor improvements in appetite; surgery considering PEG if not improving soon 5/29: per CT improvement in fluid collections and drainage is serous - CCS hopeful to remove drains later this week if improvement continues. Lytes added back to TPN and rate titrated back to goal 6/1: Pt reports continued lack of appetite; sensation of fullness in abdomen and bladder without resolution from voiding or BM; PICC exchanged; plan for G-tube when left drains have been removed 6/2: diet changed to NPO; abd CT "suspicious for significant ileus or partial small bowel obstruction" 6/3:drains removed today and placement of PEG   Today:   Glucose (goal <150): CBGs 129-153  Electrolytes (6/1)- All lytes wnl except CorrCa slightly high, Na borderline low. Mag 1.7 (6/2), phos slightly elevated at 4.8 (6/2)  Renal -  SCr improved to wnl; CrCl 54  LFTs - Alk phos slightly elevated but stable.  Others wnl (5/23)  TGs - wnl (5/23)  Prealbumin: continues to improve 2.7 > 15.1 (5/30) despite reduction in TPN rate  NUTRITIONAL GOALS                                                                                              RD recs (5/18): 75-85 g/day protein, 1500-1700 Kcal/day. Clinimix E 5/15 at a goal rate of 75m/hr + 20% fat emulsion at 133mhr to provide: 84 g/day protein, 1672 Kcal/day.  PLAN                                                                                                                          At 1800 today:  Continue Clinimix E 5/15 to goal rate of 70 ml/hr.  Continue 20% fat emulsion at 1067mr.   TPN to contain standard multivitamins and trace elements.  [facility] q4h  TPN lab panels on Mondays & Thursdays.  GreMinda DittoarmD Pager [phone]/2016, 9:01 AM

## 2015-04-16 NOTE — Progress Notes (Signed)
Patient c/o of the surgical site where G-tube was placed as "something is too tight in there". Vitals: 99.45F,99,16,153/73 100% on 0.5 LPM oxygen.  Pain medication was given. PCP on call notified.

## 2015-04-16 NOTE — Progress Notes (Addendum)
Pt blood pressure 174/87 after receiving scheduled blood pressure meds.  10mg  of hydralazine given per PRN order.  Will continue to monitor closely.

## 2015-04-16 NOTE — Progress Notes (Signed)
PCP on call asked RN to call surgery doctor.  RN notified [facility] doctor on call. No new medications/orders given at this time.

## 2015-04-16 NOTE — Progress Notes (Signed)
Brief Nutrition Note  Consult received for enteral/tube feeding initiation and management.  Adult Enteral Nutrition Protocol initiated. RD following. S/P PEG placement 6/3, ready to begin feedings. Will initiate Jevity 1.2 at 15 ml/h, increase by 10 ml every 8 hours to goal rate of 55 ml/h to provide 1584 kcals, 73 gm protein per day.   Admitting Dx: nausea small bowel obstruction feeding difficulty   Labs:   Recent Labs Lab 04/10/15 0500 04/11/15 0447 04/13/15 0851 04/14/15 0451  NA 134* 135 134*  --   K 4.1 3.9 4.9  --   CL 106 108 107  --   CO2 20* 19* 20*  --   BUN 40* 42* 45*  --   CREATININE 0.71 0.67 0.68  --   CALCIUM 9.0 9.2 9.1  --   MG 1.5* 1.9  --  1.7  PHOS 4.2 4.2  --  4.8*  GLUCOSE 131* 104* 157*  --     Joaquin Courts, RD, LDN, CNSC Pager [phone] After Hours Pager [phone]

## 2015-04-16 NOTE — Progress Notes (Addendum)
[facility] PROGRESS NOTE  [name] OVF:[id number] DOB: [birth year] DOA: 03/18/2015 PCP: Walker Kehr, MD Brief narrative 81 year old female with history of hypertension, coronary artery disease, hyperlipidemia and diabetes admitted on 03/18/2015 the small bowel obstructive secondary to adhesions.   03/24/2015:Failed conservative therapy and underwent laproscopic lysis of adhesions/laparotomy with bowel decompression.  5/13: developed  sepsis secondary to peritoneal contamination requiring transfer to stepdown unit. 5/15: Developed left arm weakness with CT scan showing 4 mm Lachman infarct in the left lentiform nucleus. MRI negative for any abnormality. Seen by neurology and recommended no further workup, as likelihood of stroke was clear. 5/21: Repeat CT of the abdomen done given for clinical progression and ongoing requirement of pain medications, showed 2 large intraperitoneal fluid collections. Patient underwent percutaneous drainage with placement of abscess drain by IR on 5/23. 5/25: Patient became febrile. Started on empiric vancomycin as cultures grew enterococcus. 5/27: Repeat CT showed improvement of abscess. Given patient clinically improving and afebrile for 24 hours and he bodies were discontinued.  Hospital Course prolonged due to ongoing poor by mouth intake and severe malnutrition. Patient also refusing G-tube placement and requiring prolonged T NA for nutrition.   Principal Problem:  Small bowel obstruction s/p exlap/LOA/decompression 8/41/6606 complicated by severe sepsis secondary to peritonitis / abdominal pain now complicated by intra-abdominal abscess/fluid collection - Status post laparoscopic lysis of adhesions/laparotomy with bowel decompression 03/24/15 . Completed abx course. -s/p  percutaneous drains placed by IR 04/03/15 to drain fluid collections in one CT.  -improved fluid collection on repeat CT. Drains removed by surgery on 6/3 followed by G-tube  placement by IR. -Start feeds later today and wean TNA  Active Problems:  Facial droop / right arm weakness / possible acute CVA left lentiform nucleus - CT and MRI results as outlined above. The echo with normal EF and no wall motion abnormality. Carotid Dopplers with nonsignificant stenosis. On aspirin. No further workup recommended per neurology.   Hypokalemia/hypomagnesemia - Monitor and replace electrolytes as needed.   Acute postoperative blood loss anemia - Status post 2 units of PRBCs 04/01/15. -stable   Diabetes type 2, controlled - Hemoglobin A1c 6.3%.  sliding scale insulin.   Anxiety state/Adjustment disorder with mixed anxiety and depressed mood  depressed mood. Poorly motivated for po intake. Will add remeron.   Coronary atherosclerosis - Continue aspirin    Acute renal failure secondary to sepsis  - Resolved with IV fluids.   Right foot pain  - Radiographs negative for fracture. Degenerative changes noted.   Polymyalgia rheumatica/Chronic fatigue disorder - followed by PT.   Elevated blood pressure  Will add low-dose metoprolol   Malnutrition of moderate degree - Continue TPN for nutritional support.    Acute delirium -Stable. Avoid benzos and minimize morphine..   Thrombocytopenia Likely related to recent sepsis. Now resolved.       DVT Prophylaxis subCutaneous Lovenox  Code Status: Full. Family Communication: Spoke with daughter  Disposition Plan: Spoke with daughter [name] on the phone today. Wishes patient to go to skilled nursing facility. Will consult social work. possibly on 6/6 once able to maximize enteral feed and wean off TNA.        Consultants:  surgery  PC CM  IR  Cardiology  Urology  ID    Procedures:  Left PICC   mechanical ventilation  2D ECHO   carotid doppler  MRI brain  CT abd and pelvis with contrast  Laparoscopy with lysis of adhesions  Abdominal drain placement.  G-tube  placement  Antibiotics:  Mefoxin 03/25/2015 --> 03/26/2015  Rocephin 03/26/2015 --> 5/23  Flagyl 03/26/2015 -->5/23    HPI/Subjective: Patient seen and examined. c/o some abdominal soreness. Had G-tube placed by IR yesterday.  Objective: Filed Vitals:   04/16/15 1619  BP: 165/89  Pulse:   Temp:   Resp:     Intake/Output Summary (Last 24 hours) at 04/16/15 1734 Last data filed at 04/16/15 1725  Gross per 24 hour  Intake    480 ml  Output      0 ml  Net    480 ml   Filed Weights   03/18/15 0700 03/25/15 0900 03/27/15 0400  Weight: 63.957 kg (141 lb) 68.7 kg (151 lb 7.3 oz) 67.7 kg (149 lb 4 oz)    Exam:   General:  Patient seen and examined. Was requiring pain medications for G-tube site pain early this morning  HEENT: No pallor, moist oral mucosa, supple neck  Chest: Clear  bilaterally, no added sounds  CVS: Normal S1 and S2, no murmurs rub or gallop  GI: Soft, nondistended, nontender, bowel sounds present, G-tube in place.  Musculoskeletal: Warm, no edema, PICC in place  CNS: Fatigued, alert and oriented.  Data Reviewed: Basic Metabolic Panel:  Recent Labs Lab 04/10/15 0500 04/11/15 0447 04/13/15 0851 04/14/15 0451  NA 134* 135 134*  --   K 4.1 3.9 4.9  --   CL 106 108 107  --   CO2 20* 19* 20*  --   GLUCOSE 131* 104* 157*  --   BUN 40* 42* 45*  --   CREATININE 0.71 0.67 0.68  --   CALCIUM 9.0 9.2 9.1  --   MG 1.5* 1.9  --  1.7  PHOS 4.2 4.2  --  4.8*   Liver Function Tests: No results for input(s): AST, ALT, ALKPHOS, BILITOT, PROT, ALBUMIN in the last 168 hours. No results for input(s): LIPASE, AMYLASE in the last 168 hours. No results for input(s): AMMONIA in the last 168 hours. CBC:  Recent Labs Lab 04/11/15 0447 04/13/15 0851  WBC 8.1 8.4  NEUTROABS 6.2  --   HGB 7.7* 10.2*  HCT 24.4* 31.8*  MCV 81.1 83.7  PLT 618* 541*   Cardiac Enzymes: No results for input(s): CKTOTAL, CKMB, CKMBINDEX, TROPONINI in the last 168 hours. BNP  (last 3 results) No results for input(s): BNP in the last 8760 hours.  ProBNP (last 3 results) No results for input(s): PROBNP in the last 8760 hours.  CBG:  Recent Labs Lab 04/15/15 2353 04/16/15 0400 04/16/15 0750 04/16/15 1215 04/16/15 1546  GLUCAP 153* 92 129* 140* 108*    No results found for this or any previous visit (from the past 240 hour(s)).   Studies: Ir Gastrostomy Tube Mod Sed  04/15/2015   CLINICAL DATA:  Malnutrition and dysphagia after surgery for small bowel obstruction. The patient requires a percutaneous gastrostomy tube.  EXAM: PERCUTANEOUS GASTROSTOMY TUBE PLACEMENT  ANESTHESIA/SEDATION: 2.0 mg IV Versed; 50 mcg IV Fentanyl.  Total Moderate Sedation Time  13 minutes.  CONTRAST:  55m OMNIPAQUE IOHEXOL 300 MG/ML  SOLN  MEDICATIONS: 2 g IV Ancef. As antibiotic prophylaxis, Ancef was ordered pre-procedure and administered intravenously within one hour of incision.  FLUOROSCOPY TIME:  1 minutes and 36 seconds.  PROCEDURE: The procedure, risks, benefits, and alternatives were explained to the patient and her family. Questions regarding the procedure were encouraged and answered. Consent was obtained from the patient's daughter.  A 5-French catheter was then advanced  through the the patient's mouth under fluoroscopy into the esophagus and to the level of the stomach. This catheter was used to insufflate the stomach with air under fluoroscopy.  The abdominal wall was prepped with Betadine in a sterile fashion, and a sterile drape was applied covering the operative field. A sterile gown and sterile gloves were used for the procedure. Local anesthesia was provided with 1% Lidocaine.  A skin incision was made in the upper abdominal wall. Under fluoroscopy, an 18 gauge trocar needle was advanced into the stomach. Contrast injection was performed to confirm intraluminal position of the needle tip. A single T tack was then deployed in the lumen of the stomach. This was brought up to  tension at the skin surface.  Over a guidewire, a 9-French sheath was advanced into the lumen of the stomach. The wire was left in place as a safety wire. A loop snare device from a percutaneous gastrostomy kit was then advanced into the stomach.  A floppy guide wire was advanced through the orogastric catheter under fluoroscopy in the stomach. The loop snare advanced through the percutaneous gastric access was used to snare the guide wire. This allowed withdrawal of the loop snare out of the patient's mouth by retraction of the orogastric catheter and wire.  A 20-French bumper retention gastrostomy tube was looped around the snare device. It was then pulled back through the patient's mouth. The retention bumper was brought up to the anterior gastric wall. The T tack suture was cut at the skin. The exiting gastrostomy tube was cut to appropriate length and a feeding adapter applied. The catheter was injected with contrast material to confirm position and a fluoroscopic spot image saved. The tube was then flushed with saline. A dressing was applied over the gastrostomy exit site.  COMPLICATIONS: None.  FINDINGS: The stomach distended well with air allowing safe placement of the gastrostomy tube. After placement, the tip of the gastrostomy tube lies in the body of the stomach.  IMPRESSION: Percutaneous gastrostomy with placement of a 20-French bumper retention tube in the body of the stomach. This tube can be used for percutaneous feeds beginning in 24 hours after placement.   Electronically Signed   By: Aletta Edouard M.D.   On: 04/15/2015 17:17    Scheduled Meds: . antiseptic oral rinse  7 mL Mouth Rinse q12n4p  . aspirin  300 mg Rectal Daily   Or  . aspirin  325 mg Oral Daily  . bisacodyl  10 mg Rectal Daily  . chlorhexidine  15 mL Mouth Rinse BID  . feeding supplement (ENSURE ENLIVE)  237 mL Oral BID BM  . hydrALAZINE  25 mg Oral 3 times per day  . insulin aspart  0-15 Units Subcutaneous 6 times per  day  . lip balm  1 application Topical BID  . metoprolol      . metoprolol  2.5 mg Intravenous 4 times per day  . mirtazapine  15 mg Oral QHS  . polyethylene glycol  17 g Oral BID   Continuous Infusions: . sodium chloride 10 mL/hr at 04/16/15 1605  . Marland KitchenTPN (CLINIMIX-E) Adult Stopped (04/16/15 1715)   And  . fat emulsion Stopped (04/16/15 1715)  . Marland KitchenTPN (CLINIMIX-E) Adult 70 mL/hr at 04/16/15 1717   And  . fat emulsion 240 mL (04/16/15 1717)  . feeding supplement (JEVITY 1.2 CAL)        Time spent: 25 minutes   Nina Mondor  [facility] Pager [phone]. If 7PM-7AM,  please contact night-coverage at [email or web address], password [facility] 04/16/2015, 5:34 PM  LOS: 29 days

## 2015-04-16 NOTE — Progress Notes (Addendum)
[name],daughter, wants Remeron 15 mg stopped. [name] wants other measures taken instead of giving antidepressants.

## 2015-04-16 NOTE — Progress Notes (Signed)
Patient ID: [name], female   DOB: [birth year], 81 y.o.   MRN: [id number]  [facility]  POD#: 23  Subjective: Patient appears comfortable, minimally responsive to voice.  Objective: Vital signs in last 24 hours: Temp:  [97.8 F (36.6 C)-99.6 F (37.6 C)] 98.4 F (36.9 C) (06/04 0946) Pulse Rate:  [96-118] 96 (06/04 0946) Resp:  [12-21] 16 (06/04 0946) BP: (137-200)/(72-112) 137/74 mmHg (06/04 0946) SpO2:  [97 %-100 %] 100 % (06/04 0946) Last BM Date: 04/13/15  Intake/Output from previous day:   Intake/Output this shift:    Physical Exam:  Abdomen - PEG tube in LUQ abdomen, dressing dry and intact  Lab Results:  No results for input(s): WBC, HGB, HCT, PLT in the last 72 hours. BMET No results for input(s): NA, K, CL, CO2, GLUCOSE, BUN, CREATININE, CALCIUM in the last 72 hours. PT/INR No results for input(s): LABPROT, INR in the last 72 hours. Comprehensive Metabolic Panel:    Component Value Date/Time   NA 134* 04/13/2015 0851   NA 135 04/11/2015 0447   K 4.9 04/13/2015 0851   K 3.9 04/11/2015 0447   CL 107 04/13/2015 0851   CL 108 04/11/2015 0447   CO2 20* 04/13/2015 0851   CO2 19* 04/11/2015 0447   BUN 45* 04/13/2015 0851   BUN 42* 04/11/2015 0447   CREATININE 0.68 04/13/2015 0851   CREATININE 0.67 04/11/2015 0447   GLUCOSE 157* 04/13/2015 0851   GLUCOSE 104* 04/11/2015 0447   CALCIUM 9.1 04/13/2015 0851   CALCIUM 9.2 04/11/2015 0447   AST 39 04/09/2015 0503   AST 43* 04/07/2015 0454   ALT 31 04/09/2015 0503   ALT 32 04/07/2015 0454   ALKPHOS 199* 04/09/2015 0503   ALKPHOS 204* 04/07/2015 0454   BILITOT 0.4 04/09/2015 0503   BILITOT 0.4 04/07/2015 0454   PROT 7.2 04/09/2015 0503   PROT 7.0 04/07/2015 0454   ALBUMIN 1.9* 04/09/2015 0503   ALBUMIN 2.0* 04/07/2015 0454    Studies/Results: Ct Abdomen Pelvis Wo Contrast  04/14/2015   CLINICAL DATA:  Small bowel obstruction, mid abdominal pain, back pain  EXAM:  CT ABDOMEN AND PELVIS WITHOUT CONTRAST  TECHNIQUE: Multidetector CT imaging of the abdomen and pelvis was performed following the standard protocol without IV contrast.  COMPARISON:  04/08/2015  FINDINGS: There is mild left basilar atelectasis. A percutaneous drainage catheter is noted in left subphrenic region posteriorly. No residual abscess is noted. A segment percutaneous drainage catheter is noted in left flank. Decrease in fluid collection at the site of catheter measures only 1.9 by 1.9 cm.  The unenhanced liver shows no biliary ductal dilatation. Status post cholecystectomy. Unenhanced pancreas spleen and adrenal glands are unremarkable. Unenhanced kidneys are symmetrical in size. There is no nephrolithiasis. No hydronephrosis or hydroureter. No calcified ureteral calculi are noted.  There are mild distended small bowel loops with air-fluid levels in left abdomen and pelvis. There is no transition point in caliber of small bowel. Findings may be due to ileus or partial small bowel obstruction. Residual contrast material from previous CT scan noted within right colon transverse colon descending colon and rectum.  Scattered colonic diverticula are noted descending colon. Multiple sigmoid colon diverticula. There is no evidence of acute diverticulitis. The urinary bladder is unremarkable.  There are atherosclerotic calcifications of coronary arteries. Atherosclerotic calcifications of abdominal aorta and SMA.  There is no free abdominal air. Sagittal images of the spine shows degenerative changes thoracolumbar spine. The patient is  status post hysterectomy.  IMPRESSION: 1. Again noted percutaneous drain left sub- diaphragmatic just posterior to the spleen. There is no residual abscess at this level. A second drain in left flank is noted. Decreased in size of fluid collection at this level measures only 1.9 x 1.9 cm. 2. No hydronephrosis or hydroureter. 3. There are mild distended small bowel loops in left  abdomen and left pelvis with some air-fluid levels. There is no transition point in caliber of small bowel. Findings suspicious for significant ileus or partial small bowel obstruction. 4. Residual contrast material from recent CT scan noted throughout the colon. 5. No free abdominal air.   Electronically Signed   By: Lahoma Crocker M.D.   On: 04/14/2015 16:23   Ir Gastrostomy Tube Mod Sed  04/15/2015   CLINICAL DATA:  Malnutrition and dysphagia after surgery for small bowel obstruction. The patient requires a percutaneous gastrostomy tube.  EXAM: PERCUTANEOUS GASTROSTOMY TUBE PLACEMENT  ANESTHESIA/SEDATION: 2.0 mg IV Versed; 50 mcg IV Fentanyl.  Total Moderate Sedation Time  13 minutes.  CONTRAST:  68m OMNIPAQUE IOHEXOL 300 MG/ML  SOLN  MEDICATIONS: 2 g IV Ancef. As antibiotic prophylaxis, Ancef was ordered pre-procedure and administered intravenously within one hour of incision.  FLUOROSCOPY TIME:  1 minutes and 36 seconds.  PROCEDURE: The procedure, risks, benefits, and alternatives were explained to the patient and her family. Questions regarding the procedure were encouraged and answered. Consent was obtained from the patient's daughter.  A 5-French catheter was then advanced through the the patient's mouth under fluoroscopy into the esophagus and to the level of the stomach. This catheter was used to insufflate the stomach with air under fluoroscopy.  The abdominal wall was prepped with Betadine in a sterile fashion, and a sterile drape was applied covering the operative field. A sterile gown and sterile gloves were used for the procedure. Local anesthesia was provided with 1% Lidocaine.  A skin incision was made in the upper abdominal wall. Under fluoroscopy, an 18 gauge trocar needle was advanced into the stomach. Contrast injection was performed to confirm intraluminal position of the needle tip. A single T tack was then deployed in the lumen of the stomach. This was brought up to tension at the skin  surface.  Over a guidewire, a 9-French sheath was advanced into the lumen of the stomach. The wire was left in place as a safety wire. A loop snare device from a percutaneous gastrostomy kit was then advanced into the stomach.  A floppy guide wire was advanced through the orogastric catheter under fluoroscopy in the stomach. The loop snare advanced through the percutaneous gastric access was used to snare the guide wire. This allowed withdrawal of the loop snare out of the patient's mouth by retraction of the orogastric catheter and wire.  A 20-French bumper retention gastrostomy tube was looped around the snare device. It was then pulled back through the patient's mouth. The retention bumper was brought up to the anterior gastric wall. The T tack suture was cut at the skin. The exiting gastrostomy tube was cut to appropriate length and a feeding adapter applied. The catheter was injected with contrast material to confirm position and a fluoroscopic spot image saved. The tube was then flushed with saline. A dressing was applied over the gastrostomy exit site.  COMPLICATIONS: None.  FINDINGS: The stomach distended well with air allowing safe placement of the gastrostomy tube. After placement, the tip of the gastrostomy tube lies in the body of the stomach.  IMPRESSION: Percutaneous gastrostomy with placement of a 20-French bumper retention tube in the body of the stomach. This tube can be used for percutaneous feeds beginning in 24 hours after placement.   Electronically Signed   By: Aletta Edouard M.D.   On: 04/15/2015 17:17    Anti-infectives: Anti-infectives    Start     Dose/Rate Route Frequency Ordered Stop   04/15/15 1615  ceFAZolin (ANCEF) 2-3 GM-% IVPB SOLR    Comments:  Margaretmary Dys   : cabinet override      04/15/15 1615 04/15/15 1632   04/15/15 1500  ceFAZolin (ANCEF) IVPB 2 g/50 mL premix     2 g 100 mL/hr over 30 Minutes Intravenous On call 04/15/15 1252 04/15/15 1649   04/07/15 2000   vancomycin (VANCOCIN) 500 mg in sodium chloride 0.9 % 100 mL IVPB  Status:  Discontinued     500 mg 100 mL/hr over 60 Minutes Intravenous Every 12 hours 04/07/15 1859 04/09/15 1500   04/07/15 1200  fluconazole (DIFLUCAN) IVPB 200 mg  Status:  Discontinued     200 mg 100 mL/hr over 60 Minutes Intravenous Every 24 hours 04/07/15 1120 04/08/15 1535   03/26/15 1600  cefTRIAXone (ROCEPHIN) 2 g in dextrose 5 % 50 mL IVPB - Premix    Comments:  Pharmacy may adjust dosing strength / duration / interval for maximal efficacy   2 g 100 mL/hr over 30 Minutes Intravenous Every 24 hours 03/26/15 1007 03/30/15 1535   03/26/15 1200  metroNIDAZOLE (FLAGYL) IVPB 500 mg     500 mg 100 mL/hr over 60 Minutes Intravenous Every 6 hours 03/26/15 1007 03/30/15 1850   03/25/15 1600  cefOXitin (MEFOXIN) 1 g in dextrose 5 % 50 mL IVPB  Status:  Discontinued     1 g 100 mL/hr over 30 Minutes Intravenous Every 12 hours 03/25/15 0801 03/26/15 1007   03/25/15 0800  cefOXitin (MEFOXIN) 1 g in dextrose 5 % 50 mL IVPB  Status:  Discontinued     1 g 100 mL/hr over 30 Minutes Intravenous 3 times per day 03/25/15 0752 03/25/15 0758   03/24/15 1700  cefOXitin (MEFOXIN) 1 g in dextrose 5 % 50 mL IVPB     1 g 100 mL/hr over 30 Minutes Intravenous Every 6 hours 03/24/15 1511 03/25/15 0512   03/24/15 0915  cefOXitin (MEFOXIN) 2 g in dextrose 5 % 50 mL IVPB    Comments:  Pharmacy may adjust dosing strength, interval, or rate of medication as needed for optimal therapy for the patient Send with patient on call to the OR.  Anesthesia to complete antibiotic administration <30mn [facility]   2 g 100 mL/hr over 30 Minutes Intravenous On call to O.R. 03/24/15 0910 03/24/15 1042      Assessment & Plans: Status post lysis of adhesions  PEG placed yesterday  May begin TF's per protocol through PEG today  Wean TNA per protocol  Plan SNF placement this week  Will follow - per medical service  TEarnstine Regal MD, FAv[facility] Office: [place]6/02/2015

## 2015-04-16 NOTE — Progress Notes (Signed)
Patient ID: [name], female   DOB: 06-22-1933, 81 y.o.   MRN: [id number]    Referring Physician(s): TRH/CCS  Subjective: Pt sleeping but arousable; some soreness at G tube as expected; otherwise stable   Allergies: Amlodipine besylate; Aspirin; Atenolol; Benazepril; Benicar; Cozaar; Hydrochlorothiazide w-triamterene; Hydrocodone; Hydroxyzine pamoate; Iodine; Lisinopril; Penicillins; Pravastatin; Prednisolone; and Tramadol hcl  Medications: Prior to Admission medications   Medication Sig Start Date End Date Taking? Authorizing Provider  acetaminophen (TYLENOL) 650 MG CR tablet Take 1,300 mg by mouth every 8 (eight) hours as needed for pain.    Yes Historical Provider, MD  aspirin 325 MG tablet Take 325 mg by mouth daily.   Yes Historical Provider, MD  cholecalciferol (VITAMIN D) 1000 UNITS tablet Take 2 tablets (2,000 Units total) by mouth daily. 04/17/13  Yes Aleksei Plotnikov V, MD  Cyanocobalamin (VITAMIN B-12) 1000 MCG SUBL Place 1 tablet (1,000 mcg total) under the tongue daily. 12/03/13  Yes Aleksei Plotnikov V, MD  furosemide (LASIX) 20 MG tablet Take 1 tablet (20 mg total) by mouth daily as needed for edema. Patient taking differently: Take 20 mg by mouth daily.  03/10/15 03/09/16 Yes Aleksei Plotnikov V, MD  LORazepam (ATIVAN) 1 MG tablet Take 0.5 tablets (0.5 mg total) by mouth every 8 (eight) hours as needed for anxiety. Patient not taking: Reported on 03/18/2015 05/07/13   Milton Ferguson, MD  metFORMIN (GLUCOPHAGE) 500 MG tablet TAKE 1 TABLET TWICE DAILY  WITH  A  MEAL 08/23/14  Yes Aleksei Plotnikov V, MD  OVER THE COUNTER MEDICATION Apply 1 application topically 2 (two) times daily as needed (dryness). Diabetic Lotion.   Yes Historical Provider, MD  oxybutynin (DITROPAN) 5 MG tablet Take 1 tablet (5 mg total) by mouth 2 (two) times daily. 11/25/14  Yes Aleksei Plotnikov V, MD  Polyethyl Glycol-Propyl Glycol (SYSTANE OP) Apply 1-2 drops to eye daily as needed (dry eyes).   Yes  Historical Provider, MD     Vital Signs: BP 137/74 mmHg  Pulse 96  Temp(Src) 98.4 F (36.9 C) (Oral)  Resp 16  Ht _0  (1.626 m)  Wt 149 lb 4 oz (67.7 kg)  BMI 25.61 kg/m2  SpO2 100%  Physical Exam G tube intact, insertion site ok, mildly tender, abd soft,ND  Imaging: Ct Abdomen Pelvis Wo Contrast  04/14/2015   CLINICAL DATA:  Small bowel obstruction, mid abdominal pain, back pain  EXAM: CT ABDOMEN AND PELVIS WITHOUT CONTRAST  TECHNIQUE: Multidetector CT imaging of the abdomen and pelvis was performed following the standard protocol without IV contrast.  COMPARISON:  04/08/2015  FINDINGS: There is mild left basilar atelectasis. A percutaneous drainage catheter is noted in left subphrenic region posteriorly. No residual abscess is noted. A segment percutaneous drainage catheter is noted in left flank. Decrease in fluid collection at the site of catheter measures only 1.9 by 1.9 cm.  The unenhanced liver shows no biliary ductal dilatation. Status post cholecystectomy. Unenhanced pancreas spleen and adrenal glands are unremarkable. Unenhanced kidneys are symmetrical in size. There is no nephrolithiasis. No hydronephrosis or hydroureter. No calcified ureteral calculi are noted.  There are mild distended small bowel loops with air-fluid levels in left abdomen and pelvis. There is no transition point in caliber of small bowel. Findings may be due to ileus or partial small bowel obstruction. Residual contrast material from previous CT scan noted within right colon transverse colon descending colon and rectum.  Scattered colonic diverticula are noted descending colon. Multiple sigmoid colon diverticula. There is  no evidence of acute diverticulitis. The urinary bladder is unremarkable.  There are atherosclerotic calcifications of coronary arteries. Atherosclerotic calcifications of abdominal aorta and SMA.  There is no free abdominal air. Sagittal images of the spine shows degenerative changes  thoracolumbar spine. The patient is status post hysterectomy.  IMPRESSION: 1. Again noted percutaneous drain left sub- diaphragmatic just posterior to the spleen. There is no residual abscess at this level. A second drain in left flank is noted. Decreased in size of fluid collection at this level measures only 1.9 x 1.9 cm. 2. No hydronephrosis or hydroureter. 3. There are mild distended small bowel loops in left abdomen and left pelvis with some air-fluid levels. There is no transition point in caliber of small bowel. Findings suspicious for significant ileus or partial small bowel obstruction. 4. Residual contrast material from recent CT scan noted throughout the colon. 5. No free abdominal air.   Electronically Signed   By: Lahoma Crocker M.D.   On: 04/14/2015 16:23   Ir Gastrostomy Tube Mod Sed  04/15/2015   CLINICAL DATA:  Malnutrition and dysphagia after surgery for small bowel obstruction. The patient requires a percutaneous gastrostomy tube.  EXAM: PERCUTANEOUS GASTROSTOMY TUBE PLACEMENT  ANESTHESIA/SEDATION: 2.0 mg IV Versed; 50 mcg IV Fentanyl.  Total Moderate Sedation Time  13 minutes.  CONTRAST:  67mL OMNIPAQUE IOHEXOL 300 MG/ML  SOLN  MEDICATIONS: 2 g IV Ancef. As antibiotic prophylaxis, Ancef was ordered pre-procedure and administered intravenously within one hour of incision.  FLUOROSCOPY TIME:  1 minutes and 36 seconds.  PROCEDURE: The procedure, risks, benefits, and alternatives were explained to the patient and her family. Questions regarding the procedure were encouraged and answered. Consent was obtained from the patient's daughter.  A 5-French catheter was then advanced through the the patient's mouth under fluoroscopy into the esophagus and to the level of the stomach. This catheter was used to insufflate the stomach with air under fluoroscopy.  The abdominal wall was prepped with Betadine in a sterile fashion, and a sterile drape was applied covering the operative field. A sterile gown and  sterile gloves were used for the procedure. Local anesthesia was provided with 1% Lidocaine.  A skin incision was made in the upper abdominal wall. Under fluoroscopy, an 18 gauge trocar needle was advanced into the stomach. Contrast injection was performed to confirm intraluminal position of the needle tip. A single T tack was then deployed in the lumen of the stomach. This was brought up to tension at the skin surface.  Over a guidewire, a 9-French sheath was advanced into the lumen of the stomach. The wire was left in place as a safety wire. A loop snare device from a percutaneous gastrostomy kit was then advanced into the stomach.  A floppy guide wire was advanced through the orogastric catheter under fluoroscopy in the stomach. The loop snare advanced through the percutaneous gastric access was used to snare the guide wire. This allowed withdrawal of the loop snare out of the patient's mouth by retraction of the orogastric catheter and wire.  A 20-French bumper retention gastrostomy tube was looped around the snare device. It was then pulled back through the patient's mouth. The retention bumper was brought up to the anterior gastric wall. The T tack suture was cut at the skin. The exiting gastrostomy tube was cut to appropriate length and a feeding adapter applied. The catheter was injected with contrast material to confirm position and a fluoroscopic spot image saved. The tube was then flushed  with saline. A dressing was applied over the gastrostomy exit site.  COMPLICATIONS: None.  FINDINGS: The stomach distended well with air allowing safe placement of the gastrostomy tube. After placement, the tip of the gastrostomy tube lies in the body of the stomach.  IMPRESSION: Percutaneous gastrostomy with placement of a 20-French bumper retention tube in the body of the stomach. This tube can be used for percutaneous feeds beginning in 24 hours after placement.   Electronically Signed   By: Aletta Edouard M.D.   On:  04/15/2015 17:17   Ir Fluoro Guide Cv Line Left  04/13/2015   CLINICAL DATA:  Malpositioned retracted PICC line, receiving TPN  EXAM: LEFT UPPER EXTREMITY POWER PICC LINE EXCHANGE WITH FLUOROSCOPIC GUIDANCE  FLUOROSCOPY TIME:  1 MINUTES 24 SECONDS, 8 MGY  PROCEDURE: The patient was advised of the possible risks and complications and agreed to undergo the procedure. The patient was then brought to the angiographic suite for the procedure.  The left arm was prepped with chlorhexidine, draped in the usual sterile fashion using maximum barrier technique (cap and mask, sterile gown, sterile gloves, large sterile sheet, hand hygiene and cutaneous antisepsis) and infiltrated locally with 1% Lidocaine.  Under sterile conditions, the existing malpositioned left PICC line was retracted and removed. 0.018 guidewire was advanced. Measurements obtained for the appropriate length. Five French sheath inserted. A new 38 cm double-lumen Power PICC line was advanced with the tip position at the SVC RA junction under fluoroscopy. Images obtained for documentation. The catheter was flushed and covered with a sterile dressing.  Catheter length: 38  Complications: None immediate  IMPRESSION: Successful left arm power PICC line exchange and reposition with fluoroscopic guidance. The catheter is ready for use.   Electronically Signed   By: Jerilynn Mages.  Shick M.D.   On: 04/13/2015 17:21   Dg Chest Port 1 View  04/12/2015   CLINICAL DATA:  PICC line placement  EXAM: PORTABLE CHEST - 1 VIEW  COMPARISON:  04/06/2015  FINDINGS: Cardiac shadow is stable. Bibasilar atelectasis is again seen. A left-sided PICC line is again noted with the catheter tip at the junction of the innominate veins directed towards the right innominate vein. The overall appearance is stable from the prior exam. Multiple left upper quadrant drains are seen.  IMPRESSION: Left-sided PICC line as described.   Electronically Signed   By: Inez Catalina M.D.   On: 04/12/2015 22:00     Labs:  CBC:  Recent Labs  04/08/15 1213 04/09/15 0503 04/11/15 0447 04/13/15 0851  WBC 11.8* 11.7* 8.1 8.4  HGB 8.5* 7.7* 7.7* 10.2*  HCT 27.5* 24.9* 24.4* 31.8*  PLT 605* 538* 618* 541*    COAGS:  Recent Labs  03/27/15 2348 04/03/15 1140  INR 1.06 1.22  APTT 40*  --     BMP:  Recent Labs  04/09/15 0503 04/10/15 0500 04/11/15 0447 04/13/15 0851  NA 133* 134* 135 134*  K 4.4 4.1 3.9 4.9  CL 104 106 108 107  CO2 20* 20* 19* 20*  GLUCOSE 125* 131* 104* 157*  BUN 41* 40* 42* 45*  CALCIUM 9.1 9.0 9.2 9.1  CREATININE 0.70 0.71 0.67 0.68  GFRNONAA >60 >60 >60 >60  GFRAA >60 >60 >60 >60    LIVER FUNCTION TESTS:  Recent Labs  03/31/15 0350 04/04/15 0650 04/07/15 0454 04/09/15 0503  BILITOT 0.4 0.5 0.4 0.4  AST 26 45* 43* 39  ALT 14 26 32 31  ALKPHOS 98 139* 204* 199*  PROT 5.9*  6.8 7.0 7.2  ALBUMIN 1.8* 1.9* 2.0* 1.9*    Assessment and Plan:  S/P perc G tube 6/3; afebrile; no new labs; ok to use tube; other plans as per IM/CCS  Signed: D. Rowe Robert 04/16/2015, 12:52 PM   I spent a total of 15 minutes in face to face in clinical consultation/evaluation, greater than 50% of which was counseling/coordinating care for gastrostomy tube

## 2015-04-17 LAB — TYPE AND SCREEN
ABO/RH(D): B POS
Antibody Screen: POSITIVE
DAT, IgG: NEGATIVE
DONOR AG TYPE: NEGATIVE
Donor AG Type: NEGATIVE
Unit division: 0
Unit division: 0

## 2015-04-17 LAB — GLUCOSE, CAPILLARY
GLUCOSE-CAPILLARY: 141 mg/dL — AB (ref 65–99)
GLUCOSE-CAPILLARY: 152 mg/dL — AB (ref 65–99)
GLUCOSE-CAPILLARY: 133 mg/dL — AB (ref 65–99)
Glucose-Capillary: 134 mg/dL — ABNORMAL HIGH (ref 65–99)
Glucose-Capillary: 136 mg/dL — ABNORMAL HIGH (ref 65–99)
Glucose-Capillary: 150 mg/dL — ABNORMAL HIGH (ref 65–99)

## 2015-04-17 MED ORDER — FAT EMULSION 20 % IV EMUL
240.0000 mL | INTRAVENOUS | Status: AC
  Filled 2015-04-17: qty 250

## 2015-04-17 MED ORDER — TRACE MINERALS CR-CU-MN-SE-ZN 10-1000-500-60 MCG/ML IV SOLN
INTRAVENOUS | Status: AC
  Filled 2015-04-17: qty 1680

## 2015-04-17 NOTE — Progress Notes (Signed)
Nutrition Follow-up  DOCUMENTATION CODES:  Non-severe (moderate) malnutrition in context of chronic illness  INTERVENTION:  - Continue TPN per pharmacy and wean as TF advances - Continue to advance by 10 mL Q8h Jevity 1.2 to 55 mL/hr to provide 1584 kcal, 73 grams protein.  - RD to continue to monitor  NUTRITION DIAGNOSIS:  Malnutrition related to chronic illness as evidenced by moderate depletion of body fat, severe depletion of muscle mass.  Ongoing.  GOAL:  Patient will meet greater than or equal to 90% of their needs  Meeting with TPN + TF  MONITOR:  PO intake, Supplement acceptance, TF tolerance, Labs, Weight trends, Skin, I & O's, Other (Comment) (TPN weaning)   ASSESSMENT: 81 year old female with history of hypertension, coronary artery disease, hyperlipidemia, iron deficiency anemia, depression, osteoporosis, mild chronic kidney disease presented to the ED with ongoing nausea for 4 days and diffuse crampy lower abdominal pain since one day prior to admission. Associated poor by mouth intake and generalized weakness. Patient was found to have small bowel obstruction.  6/3: - Pt receiving Clinimix E 5/15 @ 70 mL/hr with 20% lipids @ 10 mL/hr which is providing 1673 kcal, 84 grams protein - Family now agreeable to PEG placement after several discussions with medical staff/surgical team - TF recommendations as outlined above. Per rounds this AM, PEG may be able to be placed today - Meeting needs with TPN at this time; TPN to be weaned with TF start  6/5: -S/p PEG placement 6/3. TF protocol started 6/4 -Pt will start to be weaned from TPN today as Jevity 1.2 is advancing toward goal of 55 ml/hr. -Pt currently tolerating TF +TPN infusion with minimal residuals.   Labs: CBGs: 133-152 mg/dL    5/05 - Attempting to wean TPN starting today; 0-10% of meals over the past 3 days 5/26: - Clinimix 5/15 @ 40 mL/hr with 20% lipids @ 10 mL/hr which is providing 1162 kcal, 48  grams protein. PO intake over the past 24 hours in addition to TPN: 1908 kcal, 85.4 grams protein which is meeting needs. - Per notes and discussion with pharmacist during rounds this AM, possible plan for PEG if PO intakes continue to remain low. He indicates that no medications have been ordered for thrush. Also noted no appetite stimulants ordered; may be beneficial especially if thrush is improving and PEG is not able to be placed. 5/31 - Pt eating 0-25%. Pt now receiving Clinimix E 5/15 @ 70 mL/hr with 20% lipids @ 10 mL/hr which provides 1673 kcal, 84 grams protein. Pt not able to meet needs PO at this time. Per notes, it sounds as though GI functionality has returned (as indicated by ability to have PO diet). Based on these findings, TF preferential to TPN to maintain GI activity and functionality. Should pt need to remain on nutrition support, recommend PEG with TF to meet needs and d/c TPN.   Plan per Pharmacy 6/5: At 1200 today:  Reduce Clinimix E 5/15 to 40 ml/hr. Discontinue at 1800  Continue 20% fat emulsion at 46ml/hr.   Height:  Ht Readings from Last 1 Encounters:  03/29/15 5\' 4"  (1.626 m)    Weight:  Wt Readings from Last 1 Encounters:  04/17/15 139 lb 11.2 oz (63.368 kg)    Ideal Body Weight:  54.5 kg  Wt Readings from Last 10 Encounters:  04/17/15 139 lb 11.2 oz (63.368 kg)  12/31/14 145 lb (65.772 kg)  12/07/14 147 lb 8 oz (66.906 kg)  09/23/14 150  lb (68.04 kg)  08/19/14 150 lb (68.04 kg)  07/22/14 153 lb 12 oz (69.741 kg)  06/24/14 156 lb (70.761 kg)  05/26/14 162 lb (73.483 kg)  04/12/14 164 lb (74.39 kg)  03/14/14 172 lb (78.019 kg)    BMI:  Body mass index is 23.97 kg/(m^2).  Estimated Nutritional Needs:  Kcal:  1500-1700  Protein:  75-85g  Fluid:  1.5L/day     Skin:  Wound (see comment) (Stg II sacral ulcer, surgical incisions)  Diet Order:  Diet clear liquid Room service appropriate?: Yes; Fluid consistency:: Thin TPN (CLINIMIX-E)  Adult  EDUCATION NEEDS:  No education needs identified at this time   Intake/Output Summary (Last 24 hours) at 04/17/15 1102 Last data filed at 04/16/15 2200  Gross per 24 hour  Intake 4678.1 ml  Output      0 ml  Net 4678.1 ml    Last BM:  6/1  Tilda Franco, MS, RD, LDN Pager: [phone] After Hours Pager: [phone]

## 2015-04-17 NOTE — Progress Notes (Signed)
24 Days Post-Op  Subjective: Sore around gastrostomy tube site.  Objective: Vital signs in last 24 hours: Temp:  [97.9 F (36.6 C)-99.2 F (37.3 C)] 99.2 F (37.3 C) (06/05 0344) Pulse Rate:  [86-123] 123 (06/05 0344) Resp:  [18-20] 20 (06/05 0344) BP: (133-174)/(67-89) 143/82 mmHg (06/05 0532) SpO2:  [99 %-100 %] 100 % (06/05 0344) Weight:  [63.368 kg (139 lb 11.2 oz)] 63.368 kg (139 lb 11.2 oz) (06/05 0418) Last BM Date: 04/13/15  Intake/Output from previous day: 06/04 0701 - 06/05 0700 In: 4678.1 [P.O.:480; NG/GT:11.3; XHB:7169.6] Out: -  Intake/Output this shift:    PE: General- In NAD Abdomen-soft, laparotomy wound clean and intact, g-tube site clean and tender to palpation.  Lab Results:  No results for input(s): WBC, HGB, HCT, PLT in the last 72 hours. BMET No results for input(s): NA, K, CL, CO2, GLUCOSE, BUN, CREATININE, CALCIUM in the last 72 hours. PT/INR No results for input(s): LABPROT, INR in the last 72 hours. Comprehensive Metabolic Panel:    Component Value Date/Time   NA 134* 04/13/2015 0851   NA 135 04/11/2015 0447   K 4.9 04/13/2015 0851   K 3.9 04/11/2015 0447   CL 107 04/13/2015 0851   CL 108 04/11/2015 0447   CO2 20* 04/13/2015 0851   CO2 19* 04/11/2015 0447   BUN 45* 04/13/2015 0851   BUN 42* 04/11/2015 0447   CREATININE 0.68 04/13/2015 0851   CREATININE 0.67 04/11/2015 0447   GLUCOSE 157* 04/13/2015 0851   GLUCOSE 104* 04/11/2015 0447   CALCIUM 9.1 04/13/2015 0851   CALCIUM 9.2 04/11/2015 0447   AST 39 04/09/2015 0503   AST 43* 04/07/2015 0454   ALT 31 04/09/2015 0503   ALT 32 04/07/2015 0454   ALKPHOS 199* 04/09/2015 0503   ALKPHOS 204* 04/07/2015 0454   BILITOT 0.4 04/09/2015 0503   BILITOT 0.4 04/07/2015 0454   PROT 7.2 04/09/2015 0503   PROT 7.0 04/07/2015 0454   ALBUMIN 1.9* 04/09/2015 0503   ALBUMIN 2.0* 04/07/2015 0454     Studies/Results: Ir Gastrostomy Tube Mod Sed  04/15/2015   CLINICAL DATA:  Malnutrition and  dysphagia after surgery for small bowel obstruction. The patient requires a percutaneous gastrostomy tube.  EXAM: PERCUTANEOUS GASTROSTOMY TUBE PLACEMENT  ANESTHESIA/SEDATION: 2.0 mg IV Versed; 50 mcg IV Fentanyl.  Total Moderate Sedation Time  13 minutes.  CONTRAST:  30m OMNIPAQUE IOHEXOL 300 MG/ML  SOLN  MEDICATIONS: 2 g IV Ancef. As antibiotic prophylaxis, Ancef was ordered pre-procedure and administered intravenously within one hour of incision.  FLUOROSCOPY TIME:  1 minutes and 36 seconds.  PROCEDURE: The procedure, risks, benefits, and alternatives were explained to the patient and her family. Questions regarding the procedure were encouraged and answered. Consent was obtained from the patient's daughter.  A 5-French catheter was then advanced through the the patient's mouth under fluoroscopy into the esophagus and to the level of the stomach. This catheter was used to insufflate the stomach with air under fluoroscopy.  The abdominal wall was prepped with Betadine in a sterile fashion, and a sterile drape was applied covering the operative field. A sterile gown and sterile gloves were used for the procedure. Local anesthesia was provided with 1% Lidocaine.  A skin incision was made in the upper abdominal wall. Under fluoroscopy, an 18 gauge trocar needle was advanced into the stomach. Contrast injection was performed to confirm intraluminal position of the needle tip. A single T tack was then deployed in the lumen of the stomach. This  was brought up to tension at the skin surface.  Over a guidewire, a 9-French sheath was advanced into the lumen of the stomach. The wire was left in place as a safety wire. A loop snare device from a percutaneous gastrostomy kit was then advanced into the stomach.  A floppy guide wire was advanced through the orogastric catheter under fluoroscopy in the stomach. The loop snare advanced through the percutaneous gastric access was used to snare the guide wire. This allowed  withdrawal of the loop snare out of the patient's mouth by retraction of the orogastric catheter and wire.  A 20-French bumper retention gastrostomy tube was looped around the snare device. It was then pulled back through the patient's mouth. The retention bumper was brought up to the anterior gastric wall. The T tack suture was cut at the skin. The exiting gastrostomy tube was cut to appropriate length and a feeding adapter applied. The catheter was injected with contrast material to confirm position and a fluoroscopic spot image saved. The tube was then flushed with saline. A dressing was applied over the gastrostomy exit site.  COMPLICATIONS: None.  FINDINGS: The stomach distended well with air allowing safe placement of the gastrostomy tube. After placement, the tip of the gastrostomy tube lies in the body of the stomach.  IMPRESSION: Percutaneous gastrostomy with placement of a 20-French bumper retention tube in the body of the stomach. This tube can be used for percutaneous feeds beginning in 24 hours after placement.   Electronically Signed   By: Aletta Edouard M.D.   On: 04/15/2015 17:17    Anti-infectives: Anti-infectives    Start     Dose/Rate Route Frequency Ordered Stop   04/15/15 1615  ceFAZolin (ANCEF) 2-3 GM-% IVPB SOLR    Comments:  Margaretmary Dys   : cabinet override      04/15/15 1615 04/15/15 1632   04/15/15 1500  ceFAZolin (ANCEF) IVPB 2 g/50 mL premix     2 g 100 mL/hr over 30 Minutes Intravenous On call 04/15/15 1252 04/15/15 1649   04/07/15 2000  vancomycin (VANCOCIN) 500 mg in sodium chloride 0.9 % 100 mL IVPB  Status:  Discontinued     500 mg 100 mL/hr over 60 Minutes Intravenous Every 12 hours 04/07/15 1859 04/09/15 1500   04/07/15 1200  fluconazole (DIFLUCAN) IVPB 200 mg  Status:  Discontinued     200 mg 100 mL/hr over 60 Minutes Intravenous Every 24 hours 04/07/15 1120 04/08/15 1535   03/26/15 1600  cefTRIAXone (ROCEPHIN) 2 g in dextrose 5 % 50 mL IVPB - Premix     Comments:  Pharmacy may adjust dosing strength / duration / interval for maximal efficacy   2 g 100 mL/hr over 30 Minutes Intravenous Every 24 hours 03/26/15 1007 03/30/15 1535   03/26/15 1200  metroNIDAZOLE (FLAGYL) IVPB 500 mg     500 mg 100 mL/hr over 60 Minutes Intravenous Every 6 hours 03/26/15 1007 03/30/15 1850   03/25/15 1600  cefOXitin (MEFOXIN) 1 g in dextrose 5 % 50 mL IVPB  Status:  Discontinued     1 g 100 mL/hr over 30 Minutes Intravenous Every 12 hours 03/25/15 0801 03/26/15 1007   03/25/15 0800  cefOXitin (MEFOXIN) 1 g in dextrose 5 % 50 mL IVPB  Status:  Discontinued     1 g 100 mL/hr over 30 Minutes Intravenous 3 times per day 03/25/15 0752 03/25/15 0758   03/24/15 1700  cefOXitin (MEFOXIN) 1 g in dextrose 5 % 50 mL  IVPB     1 g 100 mL/hr over 30 Minutes Intravenous Every 6 hours 03/24/15 1511 03/25/15 0512   03/24/15 0915  cefOXitin (MEFOXIN) 2 g in dextrose 5 % 50 mL IVPB    Comments:  Pharmacy may adjust dosing strength, interval, or rate of medication as needed for optimal therapy for the patient Send with patient on call to the OR.  Anesthesia to complete antibiotic administration <65mn [facility]   2 g 100 mL/hr over 30 Minutes Intravenous On call to O.R. 03/24/15 0910 03/24/15 1042      Assessment Principal Problem:   Small bowel obstruction s/p exlap/LOA/decompression 03/25/2015 (Dr. MHassell Done   S/p CVA   Protein-calorie malnutrition, moderate-gastrostomy tube placed 04/15/15 (IR)    LOS: 30 days   Plan: G-tube feeds as tolerated.   Orlando Thalmann J 04/17/2015

## 2015-04-17 NOTE — Progress Notes (Signed)
[facility] PROGRESS NOTE  [name] GBT:[id number] DOB: 19-Sep-1933 DOA: 03/18/2015 PCP: Walker Kehr, MD Brief narrative 81 year old female with history of hypertension, coronary artery disease, hyperlipidemia and diabetes admitted on 03/18/2015 the small bowel obstructive secondary to adhesions.   03/24/2015:Failed conservative therapy and underwent laproscopic lysis of adhesions/laparotomy with bowel decompression.  5/13: developed  sepsis secondary to peritoneal contamination requiring transfer to stepdown unit. 5/15: Developed left arm weakness with CT scan showing 4 mm Lachman infarct in the left lentiform nucleus. MRI negative for any abnormality. Seen by neurology and recommended no further workup, as likelihood of stroke was clear. 5/21: Repeat CT of the abdomen done given for clinical progression and ongoing requirement of pain medications, showed 2 large intraperitoneal fluid collections. Patient underwent percutaneous drainage with placement of abscess drain by IR on 5/23. 5/25: Patient became febrile. Started on empiric vancomycin as cultures grew enterococcus. 5/27: Repeat CT showed improvement of abscess. Given patient clinically improving and afebrile for 24 hours and abx discontinued. 6/3: G tube placed by IR.  6/4: Gtube feeds started and TNA weaned.     Principal Problem:  Small bowel obstruction s/p exlap/LOA/decompression 05/21/6268 complicated by severe sepsis secondary to peritonitis / abdominal pain now complicated by intra-abdominal abscess/fluid collection - Status post laparoscopic lysis of adhesions/laparotomy with bowel decompression 03/24/15 . Completed abx course. -s/p  percutaneous drains placed by IR 04/03/15 to drain fluid collections in one CT.  -improved fluid collection on repeat CT. Drains removed by surgery on 6/3 followed by G-tube placement by IR. -Started tube feeds and weaning TNA -Has pain and soreness over G tube site.  Active  Problems:  Facial droop / right arm weakness / possible acute CVA left lentiform nucleus - CT and MRI results as outlined above. The echo with normal EF and no wall motion abnormality. Carotid Dopplers with nonsignificant stenosis. On aspirin. No further workup recommended per neurology.   Hypokalemia/hypomagnesemia - Monitor and replace electrolytes as needed.   Acute postoperative blood loss anemia - Status post 2 units of PRBCs 04/01/15. -stable   Diabetes type 2, controlled - Hemoglobin A1c 6.3%.  sliding scale insulin.   Anxiety state/Adjustment disorder with mixed anxiety and depressed mood  depressed mood. Poorly motivated for po intake. Will add remeron.   Coronary atherosclerosis - Continue aspirin    Acute renal failure secondary to sepsis  - Resolved with IV fluids.   Right foot pain  - Radiographs negative for fracture. Degenerative changes noted.   Polymyalgia rheumatica/Chronic fatigue disorder - followed by PT.   Elevated blood pressure  Will add low-dose metoprolol   Malnutrition of moderate degree - Continue TPN for nutritional support.    Acute delirium -Stable. Avoiding  benzos and minimized morphine..   Thrombocytopenia Likely related to recent sepsis. Now resolved.       DVT Prophylaxis subCutaneous Lovenox  Code Status: Full. Family Communication: Spoke with daughter  Disposition Plan: Son at bedside . Wishes patient to go to skilled nursing facility. Will consult social work. possibly on 6/6 once able to maximize enteral feed and wean off TNA.        Consultants:  surgery  PC CM  IR  Cardiology  Urology  ID    Procedures:  Left PICC   mechanical ventilation  2D ECHO   carotid doppler  MRI brain  CT abd and pelvis with contrast  Laparoscopy with lysis of adhesions  Abdominal drain placement.  G-tube placement  Antibiotics:  Mefoxin 03/25/2015 --> 03/26/2015  Rocephin 03/26/2015 -->  5/23  Flagyl 03/26/2015 -->5/23    HPI/Subjective: Patient seen and examined. c/o  abdominal soreness.   Objective: Filed Vitals:   04/17/15 0532  BP: 143/82  Pulse:   Temp:   Resp:     Intake/Output Summary (Last 24 hours) at 04/17/15 1353 Last data filed at 04/17/15 0900  Gross per 24 hour  Intake 4438.1 ml  Output      0 ml  Net 4438.1 ml   Filed Weights   03/25/15 0900 03/27/15 0400 04/17/15 0418  Weight: 68.7 kg (151 lb 7.3 oz) 67.7 kg (149 lb 4 oz) 63.368 kg (139 lb 11.2 oz)    Exam:   General:  Patient seen and examined. In some distress due to pain  HEENT: No pallor, moist oral mucosa, supple neck  Chest: Clear  bilaterally, no added sounds  CVS: Normal S1 and S2, no murmurs rub or gallop  GI: Soft, nondistended, tender around G-tube site, bowel sounds present,   Musculoskeletal: Warm, no edema, PICC in place  CNS: Fatigued, alert and oriented.  Data Reviewed: Basic Metabolic Panel:  Recent Labs Lab 04/11/15 0447 04/13/15 0851 04/14/15 0451  NA 135 134*  --   K 3.9 4.9  --   CL 108 107  --   CO2 19* 20*  --   GLUCOSE 104* 157*  --   BUN 42* 45*  --   CREATININE 0.67 0.68  --   CALCIUM 9.2 9.1  --   MG 1.9  --  1.7  PHOS 4.2  --  4.8*   Liver Function Tests: No results for input(s): AST, ALT, ALKPHOS, BILITOT, PROT, ALBUMIN in the last 168 hours. No results for input(s): LIPASE, AMYLASE in the last 168 hours. No results for input(s): AMMONIA in the last 168 hours. CBC:  Recent Labs Lab 04/11/15 0447 04/13/15 0851  WBC 8.1 8.4  NEUTROABS 6.2  --   HGB 7.7* 10.2*  HCT 24.4* 31.8*  MCV 81.1 83.7  PLT 618* 541*   Cardiac Enzymes: No results for input(s): CKTOTAL, CKMB, CKMBINDEX, TROPONINI in the last 168 hours. BNP (last 3 results) No results for input(s): BNP in the last 8760 hours.  ProBNP (last 3 results) No results for input(s): PROBNP in the last 8760 hours.  CBG:  Recent Labs Lab 04/16/15 1546 04/16/15 2007  04/17/15 0012 04/17/15 0340 04/17/15 0822  GLUCAP 108* 141* 136* 152* 133*    No results found for this or any previous visit (from the past 240 hour(s)).   Studies: Ir Gastrostomy Tube Mod Sed  04/15/2015   CLINICAL DATA:  Malnutrition and dysphagia after surgery for small bowel obstruction. The patient requires a percutaneous gastrostomy tube.  EXAM: PERCUTANEOUS GASTROSTOMY TUBE PLACEMENT  ANESTHESIA/SEDATION: 2.0 mg IV Versed; 50 mcg IV Fentanyl.  Total Moderate Sedation Time  13 minutes.  CONTRAST:  6m OMNIPAQUE IOHEXOL 300 MG/ML  SOLN  MEDICATIONS: 2 g IV Ancef. As antibiotic prophylaxis, Ancef was ordered pre-procedure and administered intravenously within one hour of incision.  FLUOROSCOPY TIME:  1 minutes and 36 seconds.  PROCEDURE: The procedure, risks, benefits, and alternatives were explained to the patient and her family. Questions regarding the procedure were encouraged and answered. Consent was obtained from the patient's daughter.  A 5-French catheter was then advanced through the the patient's mouth under fluoroscopy into the esophagus and to the level of the stomach. This catheter was used to insufflate the stomach with air under fluoroscopy.  The abdominal wall  was prepped with Betadine in a sterile fashion, and a sterile drape was applied covering the operative field. A sterile gown and sterile gloves were used for the procedure. Local anesthesia was provided with 1% Lidocaine.  A skin incision was made in the upper abdominal wall. Under fluoroscopy, an 18 gauge trocar needle was advanced into the stomach. Contrast injection was performed to confirm intraluminal position of the needle tip. A single T tack was then deployed in the lumen of the stomach. This was brought up to tension at the skin surface.  Over a guidewire, a 9-French sheath was advanced into the lumen of the stomach. The wire was left in place as a safety wire. A loop snare device from a percutaneous gastrostomy kit was  then advanced into the stomach.  A floppy guide wire was advanced through the orogastric catheter under fluoroscopy in the stomach. The loop snare advanced through the percutaneous gastric access was used to snare the guide wire. This allowed withdrawal of the loop snare out of the patient's mouth by retraction of the orogastric catheter and wire.  A 20-French bumper retention gastrostomy tube was looped around the snare device. It was then pulled back through the patient's mouth. The retention bumper was brought up to the anterior gastric wall. The T tack suture was cut at the skin. The exiting gastrostomy tube was cut to appropriate length and a feeding adapter applied. The catheter was injected with contrast material to confirm position and a fluoroscopic spot image saved. The tube was then flushed with saline. A dressing was applied over the gastrostomy exit site.  COMPLICATIONS: None.  FINDINGS: The stomach distended well with air allowing safe placement of the gastrostomy tube. After placement, the tip of the gastrostomy tube lies in the body of the stomach.  IMPRESSION: Percutaneous gastrostomy with placement of a 20-French bumper retention tube in the body of the stomach. This tube can be used for percutaneous feeds beginning in 24 hours after placement.   Electronically Signed   By: Aletta Edouard M.D.   On: 04/15/2015 17:17    Scheduled Meds: . antiseptic oral rinse  7 mL Mouth Rinse q12n4p  . aspirin  300 mg Rectal Daily   Or  . aspirin  325 mg Oral Daily  . bisacodyl  10 mg Rectal Daily  . chlorhexidine  15 mL Mouth Rinse BID  . feeding supplement (ENSURE ENLIVE)  237 mL Oral BID BM  . hydrALAZINE  25 mg Oral 3 times per day  . insulin aspart  0-15 Units Subcutaneous 6 times per day  . lip balm  1 application Topical BID  . metoprolol  2.5 mg Intravenous 4 times per day  . mirtazapine  15 mg Oral QHS  . polyethylene glycol  17 g Oral BID   Continuous Infusions: . sodium chloride 10  mL/hr at 04/16/15 1605  . Marland KitchenTPN (CLINIMIX-E) Adult 40 mL/hr at 04/17/15 1130   And  . fat emulsion 240 mL (04/17/15 1131)  . feeding supplement (JEVITY 1.2 CAL) 1,000 mL (04/17/15 1008)      Time spent: 25 minutes   Rafel Garde  [facility] Pager [phone]. If 7PM-7AM, please contact night-coverage at [email or web address], password [facility] 04/17/2015, 1:53 PM  LOS: 30 days

## 2015-04-17 NOTE — Progress Notes (Signed)
PARENTERAL NUTRITION CONSULT NOTE - follow up  Pharmacy Consult for TPN Indication: bowel obstruction  Allergies  Allergen Reactions  . Amlodipine Besylate     REACTION: dizzy  . Aspirin   . Atenolol     REACTION: fatigue  . Benazepril     cough  . Benicar [Olmesartan Medoxomil]     HA  . Cozaar     nausea  . Hydrochlorothiazide W-Triamterene     REACTION: dizzy  . Hydrocodone     REACTION: HA  . Hydroxyzine Pamoate   . Iodine   . Lisinopril     REACTION: tired, cough  . Penicillins Itching    tolerates cephalosporins OK  . Pravastatin     myalgias  . Prednisolone     My stomach hurts  . Tramadol Hcl     REACTION: HA   Patient Measurements: Height: 5' 4"  (162.6 cm) Weight: 139 lb 11.2 oz (63.368 kg) IBW/kg (Calculated) : 54.7 Adjusted Body Weight: 57.5kg  Vital Signs: Temp: 99.2 F (37.3 C) (06/05 0344) Temp Source: Oral (06/05 0344) BP: 143/82 mmHg (06/05 0532) Pulse Rate: 123 (06/05 0344) Intake/Output from previous day: 06/04 0701 - 06/05 0700 In: 620 [P.O.:480] Out: -  Intake/Output from this shift:   Labs: No results for input(s): WBC, HGB, HCT, PLT, APTT, INR in the last 72 hours.  No results for input(s): NA, K, CL, CO2, GLUCOSE, BUN, CREATININE, LABCREA, CREAT24HRUR, CALCIUM, MG, PHOS, PROT, ALBUMIN, AST, ALT, ALKPHOS, BILITOT, BILIDIR, IBILI, PREALBUMIN, TRIG, CHOLHDL, CHOL in the last 72 hours. Estimated Creatinine Clearance: 48.4 mL/min (by C-G formula based on Cr of 0.68).    Recent Labs  04/16/15 2007 04/17/15 0012 04/17/15 0340  GLUCAP 141* 136* 152*   Insulin Requirements: 8 units SSI over the past 24 hours  Current Nutrition:  Dysphagia 3 diet (start 5/20) - 10-20% meals charted as eaten--> changed to NPO on 6/2 at 1800 Ensure BID ordered on 5/20 - refused 6/2 & 6/3 Jevity 1.2 begun 6/4 pm, currently at 53m/hr, goal rate 55 ml/hr  IVF: NS @ 10 ml/hr  Central access: PICC 5/11 (exchanged on 6/1) TPN start date:  5/11  ASSESSMENT                                                                                                          HPI: 81year old female with history of hypertension, coronary artery disease, hyperlipidemia, iron deficiency anemia, depression, osteoporosis, mild chronic kidney disease presented to the ED with ongoing nausea for 4 days and diffuse crampy lower abdominal pain since one day prior to admission. Associated poor by mouth intake and generalized weakness. Patient was found to have small bowel obstruction.  Pharmacy consulted to start TPN.  Significant events:  5/12: laparoscopic lysis of adhesions with laparotomy and decompression of bowel 5/13: new AKI 5/19: Remove NGT 5/20 Completed swallow eval - starting dysphagia diet 5/21: TPN changed to electrolytes free d/t high K+; given kayexalate.  Ate about 10% of lunch 5/22: IR drainage of 2 abd abscesses, drains placed.  TPN to 50  ml/hr 5/24: Thrush noted.  TPN to 40 ml/hr; Surgery wanting to stimulate appetite. 5/27: Minor improvements in appetite; surgery considering PEG if not improving soon 5/29: per CT improvement in fluid collections and drainage is serous - CCS hopeful to remove drains later this week if improvement continues. Lytes added back to TPN and rate titrated back to goal 6/1: Pt reports continued lack of appetite; sensation of fullness in abdomen and bladder without resolution from voiding or BM; PICC exchanged; plan for G-tube when left drains have been removed 6/2: diet changed to NPO; abd CT "suspicious for significant ileus or partial small bowel obstruction" 6/3:drains removed today and placement of PEG 6/4: Tube feed begun, Jevity 1.2 at 77m/hr, incr 122mhr increments q8hr 6/5: tolerating tube feed, rate at 2535mr this am with residual of 5ml10mo attain goal rate of 55 ml/hr at 0500 on 6/6  Today:   Glucose (goal <150): CBGs 108-152, increased on Tube feed  Electrolytes (6/1)- All lytes wnl except  CorrCa slightly high, Na borderline low. Mag 1.7 (6/2), phos slightly elevated at 4.8 (6/2)  Renal -  SCr improved to wnl; CrCl 54  LFTs - Alk phos slightly elevated but stable.  Others wnl (5/23)  TGs - wnl (5/23)  Prealbumin: continues to improve 2.7 > 15.1 (5/30) despite reduction in TPN rate  NUTRITIONAL GOALS                                                                                             RD recs (5/18): 75-85 g/day protein, 1500-1700 Kcal/day. Clinimix E 5/15 at a goal rate of 70ml50m+ 20% fat emulsion at 10ml/56mo provide: 84 g/day protein, 1672 Kcal/day. Jevity 1.2 goal 55ml/h58m deliver 1584 Kcal, 73 gm protein/day  PLAN                                                                                                                          At 1200 today:  Reduce Clinimix E 5/15 to 40 ml/hr. Discontinue at 1800  Continue 20% fat emulsion at 10ml/hr64mTPN to contain standard multivitamins and trace elements.  Maintain IVF at KVO  Dis[facility] Hospitaltion scale, TPN labs  Kemoni Quesenberry, TMinda DittoPager [phone], 8:34 AM

## 2015-04-18 ENCOUNTER — Inpatient Hospital Stay (HOSPITAL_COMMUNITY): Payer: Commercial Managed Care - HMO

## 2015-04-18 DIAGNOSIS — E875 Hyperkalemia: Secondary | ICD-10-CM

## 2015-04-18 LAB — CBC
HCT: 29.6 % — ABNORMAL LOW (ref 36.0–46.0)
Hemoglobin: 9.5 g/dL — ABNORMAL LOW (ref 12.0–15.0)
MCH: 26.9 pg (ref 26.0–34.0)
MCHC: 32.1 g/dL (ref 30.0–36.0)
MCV: 83.9 fL (ref 78.0–100.0)
PLATELETS: 401 10*3/uL — AB (ref 150–400)
RBC: 3.53 MIL/uL — AB (ref 3.87–5.11)
RDW: 19.4 % — ABNORMAL HIGH (ref 11.5–15.5)
WBC: 10.3 10*3/uL (ref 4.0–10.5)

## 2015-04-18 LAB — COMPREHENSIVE METABOLIC PANEL
ALT: 54 U/L (ref 14–54)
ANION GAP: 8 (ref 5–15)
AST: 69 U/L — ABNORMAL HIGH (ref 15–41)
Albumin: 2.5 g/dL — ABNORMAL LOW (ref 3.5–5.0)
Alkaline Phosphatase: 211 U/L — ABNORMAL HIGH (ref 38–126)
BUN: 53 mg/dL — ABNORMAL HIGH (ref 6–20)
CALCIUM: 9.2 mg/dL (ref 8.9–10.3)
CO2: 23 mmol/L (ref 22–32)
CREATININE: 0.77 mg/dL (ref 0.44–1.00)
Chloride: 102 mmol/L (ref 101–111)
GFR calc Af Amer: >60 mL/min (ref >60)
GFR calc non Af Amer: >60 mL/min (ref >60)
GLUCOSE: 156 mg/dL — AB (ref 65–99)
POTASSIUM: 5.5 mmol/L — AB (ref 3.5–5.1)
SODIUM: 133 mmol/L — AB (ref 135–145)
Total Bilirubin: 0.4 mg/dL (ref 0.3–1.2)
Total Protein: 7.9 g/dL (ref 6.5–8.1)

## 2015-04-18 LAB — GLUCOSE, CAPILLARY
GLUCOSE-CAPILLARY: 143 mg/dL — AB (ref 65–99)
GLUCOSE-CAPILLARY: 138 mg/dL — AB (ref 65–99)
Glucose-Capillary: 134 mg/dL — ABNORMAL HIGH (ref 65–99)
Glucose-Capillary: 140 mg/dL — ABNORMAL HIGH (ref 65–99)
Glucose-Capillary: 97 mg/dL (ref 65–99)
Glucose-Capillary: 104 mg/dL — ABNORMAL HIGH (ref 65–99)

## 2015-04-18 LAB — LIPASE, BLOOD: Lipase: 156 U/L — ABNORMAL HIGH (ref 22–51)

## 2015-04-18 MED ORDER — SIMETHICONE 80 MG PO CHEW: 80.0000 mg | CHEWABLE_TABLET | Freq: Four times a day (QID) | ORAL | Status: DC | PRN

## 2015-04-18 MED ORDER — SODIUM POLYSTYRENE SULFONATE 15 GM/60ML PO SUSP
30.0000 g | Freq: Once | ORAL | Status: AC
  Administered 2015-04-18: 30 g via ORAL
  Filled 2015-04-18: qty 120

## 2015-04-18 NOTE — Progress Notes (Signed)
[facility] PROGRESS NOTE  [name] RKY:[id number] DOB: September 04, 1933 DOA: 03/18/2015 PCP: [name], MD Brief narrative 81 year old female with history of hypertension, coronary artery disease, hyperlipidemia and diabetes admitted on 03/18/2015 the small bowel obstructive secondary to adhesions.   03/24/2015:Failed conservative therapy and underwent laproscopic lysis of adhesions/laparotomy with bowel decompression.  5/13: developed  sepsis secondary to peritoneal contamination requiring transfer to stepdown unit. 5/15: Developed left arm weakness with CT scan showing 4 mm Lachman infarct in the left lentiform nucleus. MRI negative for any abnormality. Seen by neurology and recommended no further workup, as likelihood of stroke was clear. 5/21: Repeat CT of the abdomen done given for clinical progression and ongoing requirement of pain medications, showed 2 large intraperitoneal fluid collections. Patient underwent percutaneous drainage with placement of abscess drain by IR on 5/23. 5/25: Patient became febrile. Started on empiric vancomycin as cultures grew enterococcus. 5/27: Repeat CT showed improvement of abscess. Given patient clinically improving and afebrile for 24 hours and abx discontinued. 6/3: G tube placed by IR.  6/4: Gtube feeds started and TNA weaned.     Principal Problem:  Small bowel obstruction s/p exlap/LOA/decompression 03/25/2015 complicated by severe sepsis secondary to peritonitis / abdominal pain now complicated by intra-abdominal abscess/fluid collection - Status post laparoscopic lysis of adhesions/laparotomy with bowel decompression 03/24/15 . Completed abx course. -s/p  percutaneous drains placed by IR 04/03/15 to drain fluid collections in one CT.  -improved fluid collection on repeat CT. Drains removed by surgery on 6/3 followed by G-tube placement by IR. -Started tube feeds and weaning TNA -Has pain and soreness over G tube site.  Active  Problems:  Abdominal pain Possibly related to G tube placement. Non distended. Elevated lipase noted. Normal LFTs and WBC. Check US abdomen. Pain contort prn.   Facial droop / right arm weakness / possible acute CVA left lentiform nucleus - CT and MRI results as outlined above. The echo with normal EF and no wall motion abnormality. Carotid Dopplers with nonsignificant stenosis. On aspirin. No further workup recommended per neurology.   Hyperkalemia Ordered kayexalate   Acute postoperative blood loss anemia - Status post 2 units of PRBCs 04/01/15. -stable   Diabetes type 2, controlled - Hemoglobin A1c 6.3%.  sliding scale insulin.   Anxiety state/Adjustment disorder with mixed anxiety and depressed mood  depressed mood. Poorly motivated for po intake. Added remeron without improvement will d/c.   Coronary atherosclerosis - Continue aspirin    Acute renal failure secondary to sepsis  - Resolved with IV fluids.   Right foot pain  - Radiographs negative for fracture. Degenerative changes noted.   Polymyalgia rheumatica/Chronic fatigue disorder - followed by PT.   Elevated blood pressure Improve with low dose metoprolol   Malnutrition of moderate degree - Continue TPN for nutritional support.    Acute delirium -Stable. Avoiding  benzos and minimized morphine..   Thrombocytopenia Likely related to recent sepsis. Now resolved.       DVT Prophylaxis subcutaneous Lovenox  Code Status: Full. Family Communication: Spoke with daughter at bedside Disposition Plan: SNF possibly on 6/7 is remains stable.        Consultants:  surgery  PC CM  IR  Cardiology  Urology  ID    Procedures:  Left PICC   mechanical ventilation  2D ECHO   carotid doppler  MRI brain  CT abd and pelvis with contrast  Laparoscopy with lysis of adhesions  Abdominal drain placement.  G-tube placement  Antibiotics:  Mefoxin 03/25/2015 -->  03/26/2015  Rocephin 03/26/2015 --> 5/23  Flagyl 03/26/2015 -->5/23    HPI/Subjective: Patient seen and examined. Still c/o abdominal pain, no N/V. Poor po intake  Objective: Filed Vitals:   04/18/15 1340  BP: 137/71  Pulse: 110  Temp: 98.9 F (37.2 C)  Resp: 17    Intake/Output Summary (Last 24 hours) at 04/18/15 1454 Last data filed at 04/18/15 0900  Gross per 24 hour  Intake 3322.67 ml  Output      0 ml  Net 3322.67 ml   Filed Weights   03/27/15 0400 04/17/15 0418 04/18/15 0413  Weight: 67.7 kg (149 lb 4 oz) 63.368 kg (139 lb 11.2 oz) 63.912 kg (140 lb 14.4 oz)    Exam:   General:  Patient seen and examined. In some distress due to pain  HEENT: No pallor, moist oral mucosa, supple neck  Chest: Clear  bilaterally, no added sounds  CVS: Normal S1 and S2, no murmurs rub or gallop  GI: Soft, nondistended, tender around G-tube site, bowel sounds present,   Musculoskeletal: Warm, no edema, PICC in place  CNS: Fatigued, alert and oriented.  Data Reviewed: Basic Metabolic Panel:  Recent Labs Lab 04/13/15 0851 04/14/15 0451 04/18/15 0930  NA 134*  --  133*  K 4.9  --  5.5*  CL 107  --  102  CO2 20*  --  23  GLUCOSE 157*  --  156*  BUN 45*  --  53*  CREATININE 0.68  --  0.77  CALCIUM 9.1  --  9.2  MG  --  1.7  --   PHOS  --  4.8*  --    Liver Function Tests:  Recent Labs Lab 04/18/15 0930  AST 69*  ALT 54  ALKPHOS 211*  BILITOT 0.4  PROT 7.9  ALBUMIN 2.5*    Recent Labs Lab 04/18/15 0930  LIPASE 156*   No results for input(s): AMMONIA in the last 168 hours. CBC:  Recent Labs Lab 04/13/15 0851 04/18/15 0930  WBC 8.4 10.3  HGB 10.2* 9.5*  HCT 31.8* 29.6*  MCV 83.7 83.9  PLT 541* 401*   Cardiac Enzymes: No results for input(s): CKTOTAL, CKMB, CKMBINDEX, TROPONINI in the last 168 hours. BNP (last 3 results) No results for input(s): BNP in the last 8760 hours.  ProBNP (last 3 results) No results for input(s): PROBNP in the  last 8760 hours.  CBG:  Recent Labs Lab 04/17/15 2044 04/17/15 2336 04/18/15 0409 04/18/15 0741 04/18/15 1210  GLUCAP 134* 134* 143* 140* 138*    No results found for this or any previous visit (from the past 240 hour(s)).   Studies: No results found.  Scheduled Meds: . antiseptic oral rinse  7 mL Mouth Rinse q12n4p  . aspirin  300 mg Rectal Daily   Or  . aspirin  325 mg Oral Daily  . bisacodyl  10 mg Rectal Daily  . chlorhexidine  15 mL Mouth Rinse BID  . feeding supplement (ENSURE ENLIVE)  237 mL Oral BID BM  . hydrALAZINE  25 mg Oral 3 times per day  . lip balm  1 application Topical BID  . metoprolol  2.5 mg Intravenous 4 times per day  . mirtazapine  15 mg Oral QHS  . polyethylene glycol  17 g Oral BID  . sodium polystyrene  30 g Oral Once   Continuous Infusions: . sodium chloride 10 mL/hr at 04/16/15 1605  . feeding supplement (JEVITY 1.2 CAL) 1,000 mL (04/18/15 0800)  Time spent: 25 minutes   Eddie North  [facility] Pager [phone]. If 7PM-7AM, please contact night-coverage at [email or web address], password [facility] 04/18/2015, 2:54 PM  LOS: 31 days

## 2015-04-18 NOTE — Progress Notes (Signed)
Nutrition Follow-up  DOCUMENTATION CODES:  Non-severe (moderate) malnutrition in context of chronic illness  INTERVENTION: - Continue Jevity 1.2 to goal rate of 55 mL/hr to provide 1584 kcal, 73 grams protein. - RD to continue to monitor for needs  NUTRITION DIAGNOSIS:  Malnutrition related to chronic illness as evidenced by moderate depletion of body fat, severe depletion of muscle mass. -ongoing  GOAL:  Patient will meet greater than or equal to 90% of their needs -unmet with TPN off and TF not yet at goal rate  MONITOR:  PO intake, Supplement acceptance, TF tolerance, Labs, Weight trends, Skin, I & O's, Other (Comment) (TPN weaning)  ASSESSMENT: 81 year old female with history of hypertension, coronary artery disease, hyperlipidemia, iron deficiency anemia, depression, osteoporosis, mild chronic kidney disease presented to the ED with ongoing nausea for 4 days and diffuse crampy lower abdominal pain since one day prior to admission. Associated poor by mouth intake and generalized weakness. Patient was found to have small bowel obstruction.  6/3: - Pt receiving Clinimix E 5/15 @ 70 mL/hr with 20% lipids @ 10 mL/hr which is providing 1673 kcal, 84 grams protein - Family now agreeable to PEG placement after several discussions with medical staff/surgical team - TF recommendations as outlined above. Per rounds this AM, PEG may be able to be placed today - Meeting needs with TPN at this time; TPN to be weaned with TF start  6/5: - S/p PEG placement 6/3. TF protocol started 6/4 - Pt will start to be weaned from TPN today as Jevity 1.2 is advancing toward goal of 55 ml/hr. - Pt currently tolerating TF +TPN infusion with minimal residuals.  6/6: - RD to manage TF order placed 6/4 - TPN weaned off yesterday at 1800 - Pt currently receiving Jevity 1.2 @ 45 mL/hr with 20 mL free water Q6h which is providing 1296 kcal, 60 grams protein, and 951 mL free water - CLD ordered 6/4 and pt  consumed 100% for lunch and 75% for dinner that same day - Diet advanced by surgical team this AM to Dysphagia 3; pt has not yet received a meal with new diet order - CLD breakfast untouched in pt's room - Not meeting needs at this time; continue to advance to goal rate of TF.  Labs:  CBGs: 108-152 mg/dL and no other labs available.      5/23 - Attempting to wean TPN starting today; 0-10% of meals over the past 3 days 5/26: - Clinimix 5/15 @ 40 mL/hr with 20% lipids @ 10 mL/hr which is providing 1162 kcal, 48 grams protein. PO intake over the past 24 hours in addition to TPN: 1908 kcal, 85.4 grams protein which is meeting needs. - Per notes and discussion with pharmacist during rounds this AM, possible plan for PEG if PO intakes continue to remain low. He indicates that no medications have been ordered for thrush. Also noted no appetite stimulants ordered; may be beneficial especially if thrush is improving and PEG is not able to be placed. 5/31 - Pt eating 0-25%. Pt now receiving Clinimix E 5/15 @ 70 mL/hr with 20% lipids @ 10 mL/hr which provides 1673 kcal, 84 grams protein. Pt not able to meet needs PO at this time. Per notes, it sounds as though GI functionality has returned (as indicated by ability to have PO diet). Based on these findings, TF preferential to TPN to maintain GI activity and functionality. Should pt need to remain on nutrition support, recommend PEG with TF to meet needs  and d/c TPN.    Height:  Ht Readings from Last 1 Encounters:  03/29/15 5\' 4"  (1.626 m)    Weight:  Wt Readings from Last 1 Encounters:  04/18/15 140 lb 14.4 oz (63.912 kg)    Ideal Body Weight:  54.5 kg  Wt Readings from Last 10 Encounters:  04/18/15 140 lb 14.4 oz (63.912 kg)  12/31/14 145 lb (65.772 kg)  12/07/14 147 lb 8 oz (66.906 kg)  09/23/14 150 lb (68.04 kg)  08/19/14 150 lb (68.04 kg)  07/22/14 153 lb 12 oz (69.741 kg)  06/24/14 156 lb (70.761 kg)  05/26/14 162 lb (73.483 kg)   04/12/14 164 lb (74.39 kg)  03/14/14 172 lb (78.019 kg)    BMI:  Body mass index is 24.17 kg/(m^2).  Estimated Nutritional Needs:  Kcal:  1500-1700  Protein:  75-85g  Fluid:  1.5L/day  Skin:  Wound (see comment) (Stg II sacral ulcer, surgical incisions)  Diet Order:  DIET DYS 3 Room service appropriate?: Yes; Fluid consistency:: Thin  EDUCATION NEEDS:  No education needs identified at this time   Intake/Output Summary (Last 24 hours) at 04/18/15 1010 Last data filed at 04/18/15 0700  Gross per 24 hour  Intake 3262.67 ml  Output      0 ml  Net 3262.67 ml    Last BM:  6/6    06/18/15, RD, LDN Inpatient Clinical Dietitian Pager # [phone] After hours/weekend pager # [phone]

## 2015-04-18 NOTE — Clinical Social Work Placement (Signed)
Patient has a bed at [facility] - anticipating discharge tomorrow, Tues 6/7. Daughter, [name] (ph#: [phone]) aware.     Lincoln Maxin, LCSW [facility] Clinical Social Worker cell #: [phone]    CLINICAL SOCIAL WORK PLACEMENT  NOTE  Date:  04/18/2015  Patient Details  Name: [name] MRN: [id number] Date of Birth: [birth year]  Clinical Social Work is seeking post-discharge placement for this patient at the Skilled  Nursing Facility level of care (*CSW will initial, date and re-position this form in  chart as items are completed):  Yes   Patient/family provided with [facility] Clinical Social Work Department's list of facilities offering this level of care within the geographic area requested by the patient (or if unable, by the patient's family).  Yes   Patient/family informed of their freedom to choose among providers that offer the needed level of care, that participate in Medicare, Medicaid or managed care program needed by the patient, have an available bed and are willing to accept the patient.  Yes   Patient/family informed of [facility]'s ownership interest in [facility] and [facility] as well as of the fact that they are under no obligation to receive care at these facilities.  PASRR submitted to EDS on 03/31/15     PASRR number received on 03/31/15     Existing PASRR number confirmed on       FL2 transmitted to all facilities in geographic area requested by pt/family on 03/31/15     FL2 transmitted to all facilities within larger geographic area on       Patient informed that his/her managed care company has contracts with or will negotiate with certain facilities, including the following:        Yes   Patient/family informed of bed offers received.  Patient chooses bed at [facility]     Physician recommends and patient chooses bed at      Patient to be transferred to [facility] on  .  Patient to be transferred to  facility by       Patient family notified on   of transfer.  Name of family member notified:        PHYSICIAN       Additional Comment:    _______________________________________________ Arlyss Repress, LCSW 04/18/2015, 11:45 AM

## 2015-04-18 NOTE — Progress Notes (Signed)
[facility] Progress Note  25 Days Post-Op  Subjective: Pt c/o pain in abdomen, but when you ask her again, pain is mostly in feet/legs.  No N/V, tolerating some diet.  On TF at 20mL/hr, goal is 22mL/hr.    Objective: Vital signs in last 24 hours: Temp:  [98 F (36.7 C)-98.9 F (37.2 C)] 98.9 F (37.2 C) (06/06 0413) Pulse Rate:  [106-111] 109 (06/06 0413) Resp:  [18-22] 18 (06/06 0413) BP: (123-141)/(62-66) 140/63 mmHg (06/06 0413) SpO2:  [100 %] 100 % (06/06 0413) Weight:  [63.912 kg (140 lb 14.4 oz)] 63.912 kg (140 lb 14.4 oz) (06/06 0413) Last BM Date: 04/17/15  Intake/Output from previous day: 06/05 0701 - 06/06 0700 In: 3262.7 [I.V.:730; NG/GT:807.7; TPN:1725] Out: -  Intake/Output this shift:    PE: Gen:  Alert, NAD, pleasant Abd: Soft, mildly tender, ND, +BS, no HSM, incision well healed with staples removed. Drains removed, PEG in place with TF going at 6mL/hr.    Lab Results:  No results for input(s): WBC, HGB, HCT, PLT in the last 72 hours. BMET No results for input(s): NA, K, CL, CO2, GLUCOSE, BUN, CREATININE, CALCIUM in the last 72 hours. PT/INR No results for input(s): LABPROT, INR in the last 72 hours. CMP     Component Value Date/Time   NA 134* 04/13/2015 0851   K 4.9 04/13/2015 0851   CL 107 04/13/2015 0851   CO2 20* 04/13/2015 0851   GLUCOSE 157* 04/13/2015 0851   BUN 45* 04/13/2015 0851   CREATININE 0.68 04/13/2015 0851   CALCIUM 9.1 04/13/2015 0851   PROT 7.2 04/09/2015 0503   ALBUMIN 1.9* 04/09/2015 0503   AST 39 04/09/2015 0503   ALT 31 04/09/2015 0503   ALKPHOS 199* 04/09/2015 0503   BILITOT 0.4 04/09/2015 0503   GFRNONAA >60 04/13/2015 0851   GFRAA >60 04/13/2015 0851   Lipase     Component Value Date/Time   LIPASE 105* 04/09/2015 0503       Studies/Results: No results found.  Anti-infectives: Anti-infectives    Start     Dose/Rate Route Frequency Ordered Stop   04/15/15 1615  ceFAZolin (ANCEF) 2-3 GM-%  IVPB SOLR    Comments:  Channing Mutters   : cabinet override      04/15/15 1615 04/15/15 1632   04/15/15 1500  ceFAZolin (ANCEF) IVPB 2 g/50 mL premix     2 g 100 mL/hr over 30 Minutes Intravenous On call 04/15/15 1252 04/15/15 1649   04/07/15 2000  vancomycin (VANCOCIN) 500 mg in sodium chloride 0.9 % 100 mL IVPB  Status:  Discontinued     500 mg 100 mL/hr over 60 Minutes Intravenous Every 12 hours 04/07/15 1859 04/09/15 1500   04/07/15 1200  fluconazole (DIFLUCAN) IVPB 200 mg  Status:  Discontinued     200 mg 100 mL/hr over 60 Minutes Intravenous Every 24 hours 04/07/15 1120 04/08/15 1535   03/26/15 1600  cefTRIAXone (ROCEPHIN) 2 g in dextrose 5 % 50 mL IVPB - Premix    Comments:  Pharmacy may adjust dosing strength / duration / interval for maximal efficacy   2 g 100 mL/hr over 30 Minutes Intravenous Every 24 hours 03/26/15 1007 03/30/15 1535   03/26/15 1200  metroNIDAZOLE (FLAGYL) IVPB 500 mg     500 mg 100 mL/hr over 60 Minutes Intravenous Every 6 hours 03/26/15 1007 03/30/15 1850   03/25/15 1600  cefOXitin (MEFOXIN) 1 g in dextrose 5 % 50 mL IVPB  Status:  Discontinued  1 g 100 mL/hr over 30 Minutes Intravenous Every 12 hours 03/25/15 0801 03/26/15 1007   03/25/15 0800  cefOXitin (MEFOXIN) 1 g in dextrose 5 % 50 mL IVPB  Status:  Discontinued     1 g 100 mL/hr over 30 Minutes Intravenous 3 times per day 03/25/15 0752 03/25/15 0758   03/24/15 1700  cefOXitin (MEFOXIN) 1 g in dextrose 5 % 50 mL IVPB     1 g 100 mL/hr over 30 Minutes Intravenous Every 6 hours 03/24/15 1511 03/25/15 0512   03/24/15 0915  cefOXitin (MEFOXIN) 2 g in dextrose 5 % 50 mL IVPB    Comments:  Pharmacy may adjust dosing strength, interval, or rate of medication as needed for optimal therapy for the patient Send with patient on call to the OR.  Anesthesia to complete antibiotic administration <77min prior to incision per [facility].   2 g 100 mL/hr over 30 Minutes Intravenous On call to O.R. 03/24/15 0910  03/24/15 1042       Assessment/Plan POD #25, s/p lap converted to open LOA for SBO, Dr. Daphine Deutscher -SLP following, Resume D3 diet, but appetite low -Mobilize as able -Orals for pain, limit narcotics -On Tube feeds via PEG, advance to goal, TPN d/c'ed -Abx: Rocephin/Flagyl D5/5 now discontinued -Pulmonary toilet -Surgical drain removed 03/27/15 -Remove staples 04/07/15 -CT on 04/02/15 shows 2 intra-abdominal fluid collections, s/p IR drainage on 04/03/15 - left upper abdomen & Left lateral abdomen, cultures show few enterococcus. ID recommending stopping Vancomycin and observing off antibiotics -CT on 04/08/15 showed improvement in fluid collections -Repeat CT 04/14/15 - showed improvement in fluid collections only 1.9 x 1.9cm remaining collection with minimal output daily.  2 IR perc drains removed on 04/15/15  -Bowel regimen encouraged  PCM/on Tube feeds per PEG tube (04/15/15) -On TF and TPN d/c Leukocytosis -resolved ABL Anemia -Hgb up to 10.2 04/13/15, 1 unit pRBC 04/12/15, 2 units pRBC on 04/01/15 CVA 03/27/15 -per neurology and primary DVT prophylaxis -SCDs, on aspirin, can resume heparin from our perspective  Will sign off, she's doing well post-operatively.  Will arrange for OP follow up with Dr. Daphine Deutscher in 2-3 weeks for a post-op check.    LOS: 31 days    Nonie Hoyer 04/18/2015, 9:33 AM Pager: [phone]

## 2015-04-18 NOTE — Progress Notes (Signed)
Physical Therapy Treatment Patient Details Name: [name] MRN: [id number] DOB: 1933/05/13 Today's Date: 04/18/2015    History of Present Illness 81 year old female with history of hypertension, coronary artery disease, hyperlipidemia, diabetes and pt s/p lap converted to open LOA for SBO on 5/12, complicated by Left sided facial droop / right arm weakness / acute CVA in left lentiform nucleus however neurologist note states reviewed MRI and no acute changes seen    PT Comments    Max encouragement for participation, progression of activity. Pt is deconditioned. Will need SNF.   Follow Up Recommendations  SNF;Supervision/Assistance - 24 hour     Equipment Recommendations  Hospital bed;Wheelchair (measurements PT);Wheelchair cushion (measurements PT);3in1 (PT);Rolling walker with 5" wheels    Recommendations for Other Services OT consult     Precautions / Restrictions Precautions Precautions: Fall Precaution Comments: G tube Restrictions Weight Bearing Restrictions: No    Mobility  Bed Mobility Overal bed mobility: Needs Assistance Bed Mobility: Supine to Sit;Sit to Supine     Supine to sit: Max assist;+2 for physical assistance;+2 for safety/equipment;HOB elevated Sit to supine: Max assist;+2 for physical assistance;+2 for safety/equipment   General bed mobility comments: Assist for trunk and bil LEs. Increased time. Max multimodal cueing for participation, technique.   Transfers Overall transfer level: Needs assistance Equipment used: Rolling walker (2 wheeled) Transfers: Sit to/from Stand Sit to Stand: Mod assist;+2 physical assistance;+2 safety/equipment;From elevated surface         General transfer comment: x2. Assist to rise, stabilize, control descent, maneuver with RW. Stood ~20-30 seconds each time.   Ambulation/Gait             General Gait Details: Side steps towards HOB with RW, 4-5. Assist to stabilize pt and maneuver with rW. Increased  time.    Stairs            Wheelchair Mobility    Modified Rankin (Stroke Patients Only)       Balance           Standing balance support: Bilateral upper extremity supported;During functional activity Standing balance-Leahy Scale: Poor                      Cognition Arousal/Alertness: Lethargic Behavior During Therapy: WFL for tasks assessed/performed Overall Cognitive Status: Within Functional Limits for tasks assessed                      Exercises General Exercises - Lower Extremity Ankle Circles/Pumps: AROM;Both;10 reps;Supine    General Comments        Pertinent Vitals/Pain Pain Assessment: Faces Pain Score: 6  Pain Location: abdomen Pain Descriptors / Indicators: Sore Pain Intervention(s): Monitored during session    Home Living                      Prior Function            PT Goals (current goals can now be found in the care plan section) Progress towards PT goals: Progressing toward goals (very slowly)    Frequency  Min 3X/week    PT Plan Current plan remains appropriate    Co-evaluation             End of Session Equipment Utilized During Treatment: Gait belt Activity Tolerance: Patient limited by fatigue Patient left: in bed;with call bell/phone within reach;with bed alarm set     Time: 1105-1130 PT Time Calculation (min) (ACUTE ONLY): 25 min  Charges:  $Therapeutic Activity: 23-37 mins                    G Codes:      Rebeca Alert, MPT Pager: [phone]

## 2015-04-19 ENCOUNTER — Inpatient Hospital Stay (HOSPITAL_COMMUNITY): Payer: Commercial Managed Care - HMO

## 2015-04-19 DIAGNOSIS — E43 Unspecified severe protein-calorie malnutrition: Secondary | ICD-10-CM | POA: Diagnosis present

## 2015-04-19 LAB — BASIC METABOLIC PANEL
Anion gap: 10 (ref 5–15)
BUN: 52 mg/dL — ABNORMAL HIGH (ref 6–20)
CALCIUM: 9.2 mg/dL (ref 8.9–10.3)
CHLORIDE: 102 mmol/L (ref 101–111)
CO2: 24 mmol/L (ref 22–32)
Creatinine, Ser: 0.79 mg/dL (ref 0.44–1.00)
GFR calc non Af Amer: >60 mL/min (ref >60)
Glucose, Bld: 142 mg/dL — ABNORMAL HIGH (ref 65–99)
Potassium: 5.3 mmol/L — ABNORMAL HIGH (ref 3.5–5.1)
Sodium: 136 mmol/L (ref 135–145)

## 2015-04-19 LAB — GLUCOSE, CAPILLARY
GLUCOSE-CAPILLARY: 153 mg/dL — AB (ref 65–99)
GLUCOSE-CAPILLARY: 136 mg/dL — AB (ref 65–99)
Glucose-Capillary: 135 mg/dL — ABNORMAL HIGH (ref 65–99)
Glucose-Capillary: 137 mg/dL — ABNORMAL HIGH (ref 65–99)
Glucose-Capillary: 141 mg/dL — ABNORMAL HIGH (ref 65–99)
Glucose-Capillary: 142 mg/dL — ABNORMAL HIGH (ref 65–99)

## 2015-04-19 LAB — LIPASE, BLOOD: LIPASE: 142 U/L — AB (ref 22–51)

## 2015-04-19 MED ORDER — PANTOPRAZOLE SODIUM 40 MG PO TBEC: 40.0000 mg | DELAYED_RELEASE_TABLET | Freq: Every day | ORAL | Status: DC

## 2015-04-19 MED ORDER — HYDRALAZINE HCL 25 MG PO TABS: 25.0000 mg | ORAL_TABLET | Freq: Three times a day (TID) | ORAL | Status: DC

## 2015-04-19 MED ORDER — PANTOPRAZOLE SODIUM 40 MG PO TBEC
40.0000 mg | DELAYED_RELEASE_TABLET | Freq: Every day | ORAL | Status: DC
  Administered 2015-04-19 – 2015-04-20 (×2): 40 mg via ORAL
  Filled 2015-04-19 (×2): qty 1

## 2015-04-19 MED ORDER — OXYCODONE HCL 5 MG PO TABS: 2.5000 mg | ORAL_TABLET | Freq: Four times a day (QID) | ORAL | Status: DC | PRN

## 2015-04-19 MED ORDER — ENSURE ENLIVE PO LIQD: 237.0000 mL | Freq: Two times a day (BID) | ORAL | Status: DC

## 2015-04-19 MED ORDER — JEVITY 1.2 CAL PO LIQD: 1000.0000 mL | ORAL | Status: DC

## 2015-04-19 MED ORDER — SUCRALFATE 1 GM/10ML PO SUSP
1.0000 g | Freq: Three times a day (TID) | ORAL | Status: DC
  Administered 2015-04-19 – 2015-04-20 (×5): 1 g via ORAL
  Filled 2015-04-19 (×5): qty 10

## 2015-04-19 MED ORDER — IOHEXOL 300 MG/ML  SOLN
25.0000 mL | INTRAMUSCULAR | Status: AC
  Administered 2015-04-19 (×2): 25 mL via ORAL

## 2015-04-19 MED ORDER — SUCRALFATE 1 GM/10ML PO SUSP: 1.0000 g | Freq: Three times a day (TID) | ORAL | Status: DC

## 2015-04-19 MED ORDER — LIP MEDEX EX OINT: 1.0000 "application " | TOPICAL_OINTMENT | Freq: Two times a day (BID) | CUTANEOUS | Status: DC

## 2015-04-19 MED ORDER — SODIUM POLYSTYRENE SULFONATE 15 GM/60ML PO SUSP
15.0000 g | Freq: Once | ORAL | Status: AC
  Administered 2015-04-19: 15 g via ORAL
  Filled 2015-04-19: qty 60

## 2015-04-19 MED ORDER — ALUM & MAG HYDROXIDE-SIMETH 200-200-20 MG/5ML PO SUSP: 30.0000 mL | Freq: Four times a day (QID) | ORAL | Status: DC | PRN

## 2015-04-19 NOTE — Progress Notes (Signed)
Pt had tube feeding held yesterday due to abdominal ultrasound and it was started back around 2000 yesterday evening. Pt has order to increase the rate by 10 every 8 hours if she is tolerating it ok. Its at the 8 hour mark right now and pt has been complaining of pain all night. So I have not increased the rate. The rate is currently at 66ml/hr. Rockne Coons, RN

## 2015-04-19 NOTE — Care Management Note (Signed)
Case Management Note  Patient Details  Name: [name] MRN: [id number] Date of Birth: [birth year]  Subjective/Objective:                    Action/Plan:   Expected Discharge Date:   (unknown)               Expected Discharge Plan:  Skilled Nursing Facility  In-House Referral:  Clinical Social Work  Discharge planning Services  CM Consult  Post Acute Care Choice:    Choice offered to:     DME Arranged:    DME Agency:     HH Arranged:    HH Agency:     Status of Service:  In process, will continue to follow  Medicare Important Message Given:  Yes Date Medicare IM Given:  03/21/15 Medicare IM give by:  Aileen Pilot Date Additional Medicare IM Given:  04/05/15 Additional Medicare Important Message give by:  Geni Bers, RN  If discussed at Long Length of Stay Meetings, dates discussed:  04/19/15 Additional Comments:  Geni Bers, RN 04/19/2015, 1:13 PM

## 2015-04-19 NOTE — Discharge Summary (Addendum)
Physician Discharge Summary  JAZZMEN RESTIVO UKG:[id number] DOB: 1933-08-12 DOA: 03/18/2015  PCP: Walker Kehr, MD  Admit date: 03/18/2015 Discharge date: 04/20/2015  Time spent: 35 minutes  Recommendations for Outpatient Follow-up:  1. Discharged to skilled nursing facility. Monitor labs (BMET in 3-4 days) 2. Follow-up with surgery as outpatient  Discharge Diagnoses:  Principal Problem:   Small bowel obstruction s/p exlap/LOA/decompression 03/25/2015   Severe sepsis secondary to peritonitis and abdominal abscess   Active Problems:   Severe protein calorie malnutrition   Abdominal abscess   Diabetes type 2, controlled   Anxiety state   Adjustment disorder with mixed anxiety and depressed mood   Coronary atherosclerosis   GERD   Abdominal pain   Polymyalgia rheumatica   Chronic fatigue disorder   Knee pain, bilateral   Elevated blood pressure   Facial droop   AKI (acute kidney injury)   Pressure ulcer     Discharge Condition: Fair  Diet recommendation: Jevity 1.2 kcal at 55 mL/h, dysphagia level III diet with nutritional supplement  Filed Weights   04/17/15 0418 04/18/15 0413 04/19/15 0346  Weight: 63.368 kg (139 lb 11.2 oz) 63.912 kg (140 lb 14.4 oz) 62.869 kg (138 lb 9.6 oz)    History of present illness:  81 year old female with history of hypertension, coronary artery disease, hyperlipidemia and diabetes admitted on 03/18/2015 the small bowel obstructive secondary to adhesions.  03/24/2015:Failed conservative therapy and underwent laproscopic lysis of adhesions/laparotomy with bowel decompression.  5/13: developed sepsis secondary to peritoneal contamination requiring transfer to stepdown unit. 5/15: Developed left arm weakness with CT scan showing 4 mm Lachman infarct in the left lentiform nucleus. MRI negative for any abnormality. Seen by neurology and recommended no further workup, as likelihood of stroke was clear. 5/21: Repeat CT of the abdomen done given for  clinical progression and ongoing requirement of pain medications, showed 2 large intraperitoneal fluid collections. Patient underwent percutaneous drainage with placement of abscess drain by IR on 5/23. 5/25: Patient became febrile. Started on empiric vancomycin as cultures grew enterococcus. 5/27: Repeat CT showed improvement of abscess. Given patient clinically improving and afebrile for 24 hours and abx discontinued. 6/3: G tube placed by IR.  6/4: Gtube feeds started and TNA weaned off. 6/6: Patient complaining of ongoing abdominal pain.  Hospital Course:  Small bowel obstruction s/p exlap/LOA/decompression 7/62/8315 complicated by severe sepsis secondary to peritonitis / abdominal pain now complicated by intra-abdominal abscess/fluid collection - Status post laparoscopic lysis of adhesions/laparotomy with bowel decompression 03/24/15 . Completed abx course. -s/p percutaneous drains placed by IR 04/03/15 to drain fluid collections in follow-up CT.  -improved fluid collection on repeat CT. Drains removed by surgery on 6/3 followed by G-tube placement by IR. -Started tube feeds and weaned off TNA. Advance tube feed to goal of 55 mL/h prior to discharge.   Active Problems:  Abdominal pain Appears to be related to gastritis. Non distended. Elevated lipase noted but ultrasound and CT abdomen unremarkable.. Normal LFTs and WBC.  Have minimize narcotics as much as possible. Added Protonix, Maalox and Carafate. Continue tube feeds.   Facial droop / right arm weakness / possible acute CVA left lentiform nucleus - CT and MRI results as outlined above. echo with normal EF and no wall motion abnormality. Carotid Dopplers with nonsignificant stenosis. On aspirin. No further workup recommended per neurology. Continue full dose aspirin.   Hyperkalemia Ordered kayexalate. Potassium will be checked prior to discharge.   Acute postoperative blood loss anemia - Status post 2  units of PRBCs  04/01/15. -Hemoglobin is stable   Diabetes type 2, controlled - Hemoglobin A1c 6.3%. Stable on sliding scale insulin.   Anxiety state/Adjustment disorder with mixed anxiety and depressed mood depressed mood. Poorly motivated for po intake. Added remeron without improvement will d/c.   Coronary atherosclerosis - Continue aspirin    Acute renal failure secondary to sepsis  - Resolved with IV fluids.   Right foot pain  - Radiographs negative for fracture. Degenerative changes noted. Continue when necessary Tylenol   Polymyalgia rheumatica/Chronic fatigue disorder - followed by PT. will need ongoing rehabilitation at skilled nursing facility.   Elevated blood pressure Stable. Resumed home dose hydralazine.   Malnutrition of moderate degree - Continue tube feeds and nutritional supplements. Encouraged by mouth intake   Acute delirium while in the hospital -Resolved. Patient however is fatigued most of the time. Avoiding benzos and minimizing narcotics.   Thrombocytopenia related to  sepsis. Now resolved.    Patient is hemodynamically stable and will be discharged to skilled nursing facility.   Code Status: Full.  Family Communication: Spoke with daughter at bedside Disposition Plan: SNF   Consultants:  surgery  PC CM  IR  Cardiology  Urology  ID    Procedures:  Left PICC  mechanical ventilation  2D ECHO  carotid doppler  MRI brain  CT abd and pelvis with contrast  Laparoscopy with lysis of adhesions  Abdominal drain placement.  G-tube placement  Ultrasound abdomen  Antibiotics:  Mefoxin 03/25/2015 --> 03/26/2015  Rocephin 03/26/2015 --> 5/23  Flagyl 03/26/2015 -->5/23  Discharge Exam: Filed Vitals:   04/19/15 1400  BP: 139/72  Pulse: 109  Temp: 99 F (37.2 C)  Resp: 20     General: Elderly female lying in bed. Appears fatigued and complaining of pain.  HEENT: No pallor, moist oral mucosa, supple neck  Chest:  Clear bilaterally, no added sounds  CVS: Normal S1 and S2, no murmurs rub or gallop  GI: Soft, nondistended, abdominal tenderness mainly in the epigastric area, G-tube site clean. bowel sounds present,   Musculoskeletal: Warm, no edema,   CNS: Fatigued, alert and oriented.  Discharge Instructions    Current Discharge Medication List    START taking these medications   Details  alum & mag hydroxide-simeth (MAALOX/MYLANTA) 200-200-20 MG/5ML suspension Take 30 mLs by mouth every 6 (six) hours as needed for indigestion or heartburn (or bloating). Qty: 355 mL, Refills: 0    feeding supplement, ENSURE ENLIVE, (ENSURE ENLIVE) LIQD Take 237 mLs by mouth 2 (two) times daily between meals. Qty: 237 mL, Refills: 12    hydrALAZINE (APRESOLINE) 25 MG tablet Take 1 tablet (25 mg total) by mouth every 8 (eight) hours. Qty: 90 tablet, Refills: 0    lip balm (CARMEX) ointment Apply 1 application topically 2 (two) times daily. Qty: 7 g, Refills: 0    Nutritional Supplements (FEEDING SUPPLEMENT, JEVITY 1.2 CAL,) LIQD Place 1,000 mLs into feeding tube continuous. Qty: 1000 mL, Refills: 0    oxyCODONE (OXY IR/ROXICODONE) 5 MG immediate release tablet Take 0.5 tablets (2.5 mg total) by mouth every 6 (six) hours as needed for moderate pain, severe pain or breakthrough pain. Qty: 30 tablet, Refills: 0    pantoprazole (PROTONIX) 40 MG tablet Take 1 tablet (40 mg total) by mouth daily. Qty: 30 tablet, Refills: 0    sucralfate (CARAFATE) 1 GM/10ML suspension Take 10 mLs (1 g total) by mouth 4 (four) times daily -  with meals and at bedtime. Qty: 420  mL, Refills: 0        CONTINUE these medications which have NOT CHANGED   Details  acetaminophen (TYLENOL) 650 MG CR tablet Take 1,300 mg by mouth every 8 (eight) hours as needed for pain.     aspirin 325 MG tablet Take 325 mg by mouth daily.    cholecalciferol (VITAMIN D) 1000 UNITS tablet Take 2 tablets (2,000 Units total) by mouth daily. Qty:  200 tablet, Refills: 3    Cyanocobalamin (VITAMIN B-12) 1000 MCG SUBL Place 1 tablet (1,000 mcg total) under the tongue daily. Qty: 100 tablet, Refills: 3    furosemide (LASIX) 20 MG tablet Take 1 tablet (20 mg total) by mouth daily as needed for edema. Qty: 30 tablet, Refills: 0    metFORMIN (GLUCOPHAGE) 500 MG tablet TAKE 1 TABLET TWICE DAILY  WITH  A  MEAL Qty: 180 tablet, Refills: 3    OVER THE COUNTER MEDICATION Apply 1 application topically 2 (two) times daily as needed (dryness). Diabetic Lotion.    oxybutynin (DITROPAN) 5 MG tablet Take 1 tablet (5 mg total) by mouth 2 (two) times daily. Qty: 180 tablet, Refills: 3    Polyethyl Glycol-Propyl Glycol (SYSTANE OP) Apply 1-2 drops to eye daily as needed (dry eyes).      STOP taking these medications     LORazepam (ATIVAN) 1 MG tablet        Allergies  Allergen Reactions  . Amlodipine Besylate     REACTION: dizzy  . Aspirin   . Atenolol     REACTION: fatigue  . Benazepril     cough  . Benicar [Olmesartan Medoxomil]     HA  . Cozaar     nausea  . Hydrochlorothiazide W-Triamterene     REACTION: dizzy  . Hydrocodone     REACTION: HA  . Hydroxyzine Pamoate   . Iodine   . Lisinopril     REACTION: tired, cough  . Penicillins Itching    tolerates cephalosporins OK  . Pravastatin     myalgias  . Prednisolone     My stomach hurts  . Tramadol Hcl     REACTION: HA   Follow-up Information    Schedule an appointment as soon as possible for a visit with Pedro Earls, MD.   Specialty:  General Surgery   Why:  For post-operation check.  Call the office to confirm date/time.   Contact information:   [address] [place] [address] [phone]       Please follow up.   Why:  MD at SNF       The results of significant diagnostics from this hospitalization (including imaging, microbiology, ancillary and laboratory) are listed below for reference.    Significant Diagnostic Studies: Ct Abdomen  Pelvis Wo Contrast  04/19/2015   CLINICAL DATA:  Abdominal pain  EXAM: CT ABDOMEN AND PELVIS WITHOUT CONTRAST  TECHNIQUE: Multidetector CT imaging of the abdomen and pelvis was performed following the standard protocol without IV contrast.  COMPARISON:  04/14/2015  FINDINGS: Exam is severely limited by lack of intravenous contrast and streak artifact.  Bibasilar patchy pulmonary opacities have increased compatible with atelectasis versus airspace disease. Three vessel coronary artery calcification.  There is no evidence of recurrent abscess adjacent to the spleen or descending colon. Previously seen drains have been removed.  Tiny hypodensity towards the dome of the liver on image 16 is stable and likely benign.  Postcholecystectomy  Interval gastrostomy tube as been placed. The tip  is appropriately positioned within the body of the stomach and without complication.  Oral contrast is scattered throughout small and large bowel.  Kidneys, adrenal glands, pancreas are grossly within normal limits.  No free-fluid.  No vertebral compression.  Lumbar degenerative disc disease.  IMPRESSION: No new abscess.  Previous drains of been removed.  New gastrostomy tube.  Bibasilar atelectasis versus airspace disease increased.   Electronically Signed   By: Marybelle Killings M.D.   On: 04/19/2015 14:41   Ct Abdomen Pelvis Wo Contrast  04/14/2015   CLINICAL DATA:  Small bowel obstruction, mid abdominal pain, back pain  EXAM: CT ABDOMEN AND PELVIS WITHOUT CONTRAST  TECHNIQUE: Multidetector CT imaging of the abdomen and pelvis was performed following the standard protocol without IV contrast.  COMPARISON:  04/08/2015  FINDINGS: There is mild left basilar atelectasis. A percutaneous drainage catheter is noted in left subphrenic region posteriorly. No residual abscess is noted. A segment percutaneous drainage catheter is noted in left flank. Decrease in fluid collection at the site of catheter measures only 1.9 by 1.9 cm.  The unenhanced  liver shows no biliary ductal dilatation. Status post cholecystectomy. Unenhanced pancreas spleen and adrenal glands are unremarkable. Unenhanced kidneys are symmetrical in size. There is no nephrolithiasis. No hydronephrosis or hydroureter. No calcified ureteral calculi are noted.  There are mild distended small bowel loops with air-fluid levels in left abdomen and pelvis. There is no transition point in caliber of small bowel. Findings may be due to ileus or partial small bowel obstruction. Residual contrast material from previous CT scan noted within right colon transverse colon descending colon and rectum.  Scattered colonic diverticula are noted descending colon. Multiple sigmoid colon diverticula. There is no evidence of acute diverticulitis. The urinary bladder is unremarkable.  There are atherosclerotic calcifications of coronary arteries. Atherosclerotic calcifications of abdominal aorta and SMA.  There is no free abdominal air. Sagittal images of the spine shows degenerative changes thoracolumbar spine. The patient is status post hysterectomy.  IMPRESSION: 1. Again noted percutaneous drain left sub- diaphragmatic just posterior to the spleen. There is no residual abscess at this level. A second drain in left flank is noted. Decreased in size of fluid collection at this level measures only 1.9 x 1.9 cm. 2. No hydronephrosis or hydroureter. 3. There are mild distended small bowel loops in left abdomen and left pelvis with some air-fluid levels. There is no transition point in caliber of small bowel. Findings suspicious for significant ileus or partial small bowel obstruction. 4. Residual contrast material from recent CT scan noted throughout the colon. 5. No free abdominal air.   Electronically Signed   By: Lahoma Crocker M.D.   On: 04/14/2015 16:23   Dg Chest 2 View  04/06/2015   CLINICAL DATA:  Fever  EXAM: CHEST  2 VIEW  COMPARISON:  03/27/2015  FINDINGS: Cardiac shadow is mildly enlarged. Left basilar  atelectasis is again identified. Two recently placed drainage catheters are noted in the left upper abdomen. Mild right basilar atelectasis is noted. A left-sided PICC line is noted with the tip at the junction of the innominate veins. This has withdrawn slightly in the interval from the prior exam. No acute bony abnormality is seen.  IMPRESSION: Bibasilar atelectasis. This has increased in the interval from the prior exam.  Left upper quadrant drainage catheters.   Electronically Signed   By: Inez Catalina M.D.   On: 04/06/2015 14:57   Ct Head Wo Contrast  03/27/2015   CLINICAL DATA:  RIGHT-sided facial droop. Symptoms began earlier today. Altered mental status.  EXAM: CT HEAD WITHOUT CONTRAST  TECHNIQUE: Contiguous axial images were obtained from the base of the skull through the vertex without intravenous contrast.  COMPARISON:  05/27/2014.  FINDINGS: Possible acute 4 mm lacune, LEFT lentiform nucleus as seen on image 12 series 5. This was not clearly present on in 2015 scan.  No other areas of concern for cortical or subcortical infarction.  No hemorrhage, mass lesion, hydrocephalus, or extra-axial fluid.  Cerebral and cerebellar atrophy. Generalized white matter hypoattenuation, likely small vessel disease. Than the calvarium intact. Vascular calcification. No sinus or mastoid disease. Dense lenticular opacities.  IMPRESSION: Possible acute 4 mm lacunar infarct LEFT lentiform nucleus. If further investigation desired, and no contraindications, consider MRI brain.   Electronically Signed   By: Rolla Flatten M.D.   On: 03/27/2015 16:25   Mr Jodene Nam Head Wo Contrast  03/28/2015   CLINICAL DATA:  81 year old with right-sided facial droop. Altered mental status. Subsequent encounter.  EXAM: MRI HEAD WITHOUT CONTRAST  MRA HEAD WITHOUT CONTRAST  TECHNIQUE: Multiplanar, multiecho pulse sequences of the brain and surrounding structures were obtained without intravenous contrast. Angiographic images of the head were  obtained using MRA technique without contrast.  COMPARISON:  03/27/2015 head CT.  FINDINGS: MRI HEAD FINDINGS  No acute infarct.  No intracranial hemorrhage.  Remote tiny infarct right cerebellum. Minimal small vessel disease type changes.  Incidentally noted is a prominent peri vascular space left lenticular nucleus.  Global atrophy without hydrocephalus.  No intracranial mass lesion noted on this unenhanced exam.  Cervical medullary junction unremarkable. Mild spinal stenosis C3-4.  Expanded partially empty sella without other findings of pseudotumor cerebri  Pineal region unremarkable.  No intracranial mass lesion noted on this unenhanced exam.  MRA HEAD FINDINGS  Mild narrowing pre cavernous/ cavernous segment right internal carotid artery.  Mild ectasia left internal carotid artery cavernous segment. The minimal bulge along the left internal carotid artery cavernous segment most likely related to slight ectasia of the left ophthalmic artery origin rather than small aneurysm. Mild motion degradation limits evaluation.  Slightly ectatic anterior communicating artery without aneurysm.  Middle cerebral artery mild branch vessel irregularity bilaterally.  No significant stenosis distal vertebral arteries or basilar artery. Regions of mild irregularity and slight narrowing.  IMPRESSION: MRI HEAD  No acute infarct.  Remote tiny infarct right cerebellum.  Incidentally noted is a prominent peri vascular space left lenticular nucleus.  Global atrophy without hydrocephalus.  Expanded partially empty sella without other findings of pseudotumor cerebri  MRA HEAD FINDINGS  No medium or large size vessel significant stenosis or occlusion. Evaluation of branch vessels slightly limited by motion degradation.   Electronically Signed   By: Genia Del M.D.   On: 03/28/2015 12:02   Mr Brain Wo Contrast  03/28/2015   CLINICAL DATA:  81 year old with right-sided facial droop. Altered mental status. Subsequent encounter.  EXAM:  MRI HEAD WITHOUT CONTRAST  MRA HEAD WITHOUT CONTRAST  TECHNIQUE: Multiplanar, multiecho pulse sequences of the brain and surrounding structures were obtained without intravenous contrast. Angiographic images of the head were obtained using MRA technique without contrast.  COMPARISON:  03/27/2015 head CT.  FINDINGS: MRI HEAD FINDINGS  No acute infarct.  No intracranial hemorrhage.  Remote tiny infarct right cerebellum. Minimal small vessel disease type changes.  Incidentally noted is a prominent peri vascular space left lenticular nucleus.  Global atrophy without hydrocephalus.  No intracranial mass lesion noted on this unenhanced exam.  Cervical medullary junction unremarkable. Mild spinal stenosis C3-4.  Expanded partially empty sella without other findings of pseudotumor cerebri  Pineal region unremarkable.  No intracranial mass lesion noted on this unenhanced exam.  MRA HEAD FINDINGS  Mild narrowing pre cavernous/ cavernous segment right internal carotid artery.  Mild ectasia left internal carotid artery cavernous segment. The minimal bulge along the left internal carotid artery cavernous segment most likely related to slight ectasia of the left ophthalmic artery origin rather than small aneurysm. Mild motion degradation limits evaluation.  Slightly ectatic anterior communicating artery without aneurysm.  Middle cerebral artery mild branch vessel irregularity bilaterally.  No significant stenosis distal vertebral arteries or basilar artery. Regions of mild irregularity and slight narrowing.  IMPRESSION: MRI HEAD  No acute infarct.  Remote tiny infarct right cerebellum.  Incidentally noted is a prominent peri vascular space left lenticular nucleus.  Global atrophy without hydrocephalus.  Expanded partially empty sella without other findings of pseudotumor cerebri  MRA HEAD FINDINGS  No medium or large size vessel significant stenosis or occlusion. Evaluation of branch vessels slightly limited by motion  degradation.   Electronically Signed   By: Genia Del M.D.   On: 03/28/2015 12:02   US Abdomen Complete  04/18/2015   CLINICAL DATA:  Abdominal pain.  Right upper quadrant pain.  EXAM: ULTRASOUND ABDOMEN COMPLETE  COMPARISON:  None.  FINDINGS: Gallbladder: Prior cholecystectomy.  Common bile duct: Diameter: 13 mm  Liver: No focal lesion identified. Within normal limits in parenchymal echogenicity. Mild intrahepatic biliary ductal dilatation centrally.  IVC: No abnormality visualized.  Pancreas: Limited visualization secondary to overlying bowel gas.  Spleen: Size and appearance within normal limits.  Right Kidney: Length: 8.9 cm. Increased renal cortical echogenicity. No mass or hydronephrosis visualized.  Left Kidney: Length: 9.2 cm. Increased renal cortical echogenicity. No mass or hydronephrosis visualized.  Abdominal aorta: Abdominal aorta measures 2.8 cm in AP diameter.  Other findings: None.  IMPRESSION: 1. Prior cholecystectomy. 2. Suboptimally evaluated pancreas secondary to overlying bowel gas. 3. Mildly atrophic kidneys with increased renal cortical echogenicity compatible with medical renal disease. 4. Ectatic abdominal aorta at risk for aneurysm development. Recommend follow up by [place] in 5 years. This recommendation follows ACR consensus guidelines: White Paper of the ACR Incidental Findings Committee II on Vascular Findings. J Am Coll Radiol 2013; 10:789-794.   Electronically Signed   By: Kathreen Devoid   On: 04/18/2015 19:09   Ct Abdomen Pelvis W Contrast  04/08/2015   CLINICAL DATA:  SOB, Abscess drains 04/04/2015. Surg: TAH, Gb, tubal, laparoscopy adhesion. Hx of aortic thoracic aneurysm. Elev WBC's, fever.  EXAM: CT ABDOMEN AND PELVIS WITH CONTRAST  TECHNIQUE: Multidetector CT imaging of the abdomen and pelvis was performed using the standard protocol following bolus administration of intravenous contrast.  CONTRAST:  147m OMNIPAQUE IOHEXOL 300 MG/ML  SOLN  COMPARISON:  04/03/2015 and earlier  studies  FINDINGS: Small left pleural effusion. Consolidation/atelectasis with air bronchograms in the posterior and medial basal segments left lower lobe, and patchy airspace disease posteriorly at the right lung base. Coronary calcifications.  Stable percutaneous drain from intercostal approach posterior to the spleen, with some decrease in the subcapsular/subphrenic collection, with moderate residual. Catheter is at the dependent posterior aspect of the residual component.  Stable left mid abdominal percutaneous pigtail drain from an intercostal approach, with decrease in size of thick-walled complex fluid collection, residual 2.1 x 4.3 cm. Catheter appears well positioned centrally within the residual.  No new intra-abdominal collections. Surgical clips  in the gallbladder fossa. Stable mild central intrahepatic biliary ductal dilatation and dilatation of the common duct. No focal liver lesion. Unremarkable spleen, adrenal glands, pancreas, kidneys. No hydronephrosis. Patchy aortoiliac calcifications without aneurysm or stenosis. Portal vein patent. Stomach, small bowel, and colon are nondilated. Multiple diverticula from the sigmoid colon without adjacent inflammatory/edematous change. The urinary bladder is incompletely distended. Bilateral pelvic phleboliths. Old healed pubic fractures. Spondylitic changes in the mid and lower lumbar spine, with mild dextroscoliosis.  IMPRESSION: 1. Some interval improvement in subphrenic and left mid abdomen abscess collections post percutaneous drain placement x2. Both have residual undrained components but the drain catheters appear well positioned. 2. Persistent small left pleural effusion and bibasilar airspace disease left greater than right. 3. Atherosclerosis, including aortoiliac and coronary artery disease. Please note that although the presence of coronary artery calcium documents the presence of coronary artery disease, the severity of this disease and any  potential stenosis cannot be assessed on this non-gated CT examination. Assessment for potential risk factor modification, dietary therapy or pharmacologic therapy may be warranted, if clinically indicated.   Electronically Signed   By: Lucrezia Europe M.D.   On: 04/08/2015 09:49   Ct Abdomen Pelvis W Contrast  04/02/2015   CLINICAL DATA:  Nonspecific abdominal pain. Status post recent laparotomy for small bowel obstruction.  EXAM: CT ABDOMEN AND PELVIS WITH CONTRAST  TECHNIQUE: Multidetector CT imaging of the abdomen and pelvis was performed using the standard protocol following bolus administration of intravenous contrast.  CONTRAST:  48m OMNIPAQUE IOHEXOL 300 MG/ML SOLN, 1069mOMNIPAQUE IOHEXOL 300 MG/ML SOLN  COMPARISON:  Renal ultrasound dated 03/25/2015 and abdomen and pelvis CT dated 03/18/2015.  FINDINGS: Cholecystectomy clips. Interval midline surgical scar in the skin clips at the level of the pelvis. Loculated fluid with peripheral rim enhancement beneath the left hemidiaphragm and partially surrounding the spleen. This measures 7.7 x 5.3 cm on axial image 16. This measures 7.7 cm in length on coronal image number 92.  There is an additional fluid collection in the lateral aspect of the left mid abdomen, measuring 8.3 x 4.2 cm on axial image number 31. This also has peripheral rim enhancement. This measures 9.8 cm in length on coronal image number 64.  There are dilated small bowel loops in the lower abdomen and upper pelvis. Above the urinary bladder, one of these loops contains fluid, small amount of oral contrast and some gas. No definite separate fluid collection is seen.  A Foley catheter is in the urinary bladder with associated air in the bladder. No free peritoneal air seen. Atheromatous arterial calcifications, including the coronary arteries. Cholecystectomy clips.  Small left pleural effusion. Bilateral lower lobe atelectasis. Multiple colonic diverticula. Small left inguinal hernia containing  fat. Diffuse subcutaneous edema. Lumbar and lower thoracic spine degenerative changes and scoliosis.  IMPRESSION: 1. 7.7 x 7.7 x 5.3 cm fluid collection with rim enhancement beneath the left hemidiaphragm and partially surrounding the spleen. This is suspicious for an abscess. 2. 9.8 x 8.3 x 4.2 cm arm left mid abdominal fluid collection with peripheral rim enhancement, suspicious for an abscess. 3. Focal ileus or partial obstruction involving small bowel in the inferior abdomen and upper pelvis. 4. Small left pleural effusion. 5. Bilateral lower lobe atelectasis. 6. Colonic diverticulosis.   Electronically Signed   By: StClaudie Revering.D.   On: 04/02/2015 17:38   Ir Gastrostomy Tube Mod Sed  04/15/2015   CLINICAL DATA:  Malnutrition and dysphagia after surgery for small bowel obstruction.  The patient requires a percutaneous gastrostomy tube.  EXAM: PERCUTANEOUS GASTROSTOMY TUBE PLACEMENT  ANESTHESIA/SEDATION: 2.0 mg IV Versed; 50 mcg IV Fentanyl.  Total Moderate Sedation Time  13 minutes.  CONTRAST:  61m OMNIPAQUE IOHEXOL 300 MG/ML  SOLN  MEDICATIONS: 2 g IV Ancef. As antibiotic prophylaxis, Ancef was ordered pre-procedure and administered intravenously within one hour of incision.  FLUOROSCOPY TIME:  1 minutes and 36 seconds.  PROCEDURE: The procedure, risks, benefits, and alternatives were explained to the patient and her family. Questions regarding the procedure were encouraged and answered. Consent was obtained from the patient's daughter.  A 5-French catheter was then advanced through the the patient's mouth under fluoroscopy into the esophagus and to the level of the stomach. This catheter was used to insufflate the stomach with air under fluoroscopy.  The abdominal wall was prepped with Betadine in a sterile fashion, and a sterile drape was applied covering the operative field. A sterile gown and sterile gloves were used for the procedure. Local anesthesia was provided with 1% Lidocaine.  A skin incision  was made in the upper abdominal wall. Under fluoroscopy, an 18 gauge trocar needle was advanced into the stomach. Contrast injection was performed to confirm intraluminal position of the needle tip. A single T tack was then deployed in the lumen of the stomach. This was brought up to tension at the skin surface.  Over a guidewire, a 9-French sheath was advanced into the lumen of the stomach. The wire was left in place as a safety wire. A loop snare device from a percutaneous gastrostomy kit was then advanced into the stomach.  A floppy guide wire was advanced through the orogastric catheter under fluoroscopy in the stomach. The loop snare advanced through the percutaneous gastric access was used to snare the guide wire. This allowed withdrawal of the loop snare out of the patient's mouth by retraction of the orogastric catheter and wire.  A 20-French bumper retention gastrostomy tube was looped around the snare device. It was then pulled back through the patient's mouth. The retention bumper was brought up to the anterior gastric wall. The T tack suture was cut at the skin. The exiting gastrostomy tube was cut to appropriate length and a feeding adapter applied. The catheter was injected with contrast material to confirm position and a fluoroscopic spot image saved. The tube was then flushed with saline. A dressing was applied over the gastrostomy exit site.  COMPLICATIONS: None.  FINDINGS: The stomach distended well with air allowing safe placement of the gastrostomy tube. After placement, the tip of the gastrostomy tube lies in the body of the stomach.  IMPRESSION: Percutaneous gastrostomy with placement of a 20-French bumper retention tube in the body of the stomach. This tube can be used for percutaneous feeds beginning in 24 hours after placement.   Electronically Signed   By: GAletta EdouardM.D.   On: 04/15/2015 17:17   [place]al  03/25/2015   CLINICAL DATA:  Acute renal failure.  EXAM: RENAL / URINARY  TRACT ULTRASOUND COMPLETE  COMPARISON:  None.  FINDINGS: Right Kidney:  Length: 8.1 cm. Increased echogenicity consistent with chronic medical renal disease. No mass or hydronephrosis visualized.  Left Kidney:  Length: 9.3 cm. Increased echogenicity consistent chronic medical renal disease. No mass or hydronephrosis visualized.  Bladder:  Bladder decompressed by Foley catheter.  IMPRESSION: Bilateral echodense kidneys consistent chronic medical renal disease. No acute abnormality. No hydronephrosis or bladder distention.   Electronically Signed   By: TMarcello Moores  Register   On: 03/25/2015 13:27   Ir Fluoro Guide Cv Line Left  04/13/2015   CLINICAL DATA:  Malpositioned retracted PICC line, receiving TPN  EXAM: LEFT UPPER EXTREMITY POWER PICC LINE EXCHANGE WITH FLUOROSCOPIC GUIDANCE  FLUOROSCOPY TIME:  1 MINUTES 24 SECONDS, 8 MGY  PROCEDURE: The patient was advised of the possible risks and complications and agreed to undergo the procedure. The patient was then brought to the angiographic suite for the procedure.  The left arm was prepped with chlorhexidine, draped in the usual sterile fashion using maximum barrier technique (cap and mask, sterile gown, sterile gloves, large sterile sheet, hand hygiene and cutaneous antisepsis) and infiltrated locally with 1% Lidocaine.  Under sterile conditions, the existing malpositioned left PICC line was retracted and removed. 0.018 guidewire was advanced. Measurements obtained for the appropriate length. Five French sheath inserted. A new 38 cm double-lumen Power PICC line was advanced with the tip position at the SVC RA junction under fluoroscopy. Images obtained for documentation. The catheter was flushed and covered with a sterile dressing.  Catheter length: 38  Complications: None immediate  IMPRESSION: Successful left arm power PICC line exchange and reposition with fluoroscopic guidance. The catheter is ready for use.   Electronically Signed   By: Jerilynn Mages.  Shick M.D.   On:  04/13/2015 17:21   Dg Chest Port 1 View  04/12/2015   CLINICAL DATA:  PICC line placement  EXAM: PORTABLE CHEST - 1 VIEW  COMPARISON:  04/06/2015  FINDINGS: Cardiac shadow is stable. Bibasilar atelectasis is again seen. A left-sided PICC line is again noted with the catheter tip at the junction of the innominate veins directed towards the right innominate vein. The overall appearance is stable from the prior exam. Multiple left upper quadrant drains are seen.  IMPRESSION: Left-sided PICC line as described.   Electronically Signed   By: Inez Catalina M.D.   On: 04/12/2015 22:00   Dg Chest Port 1 View  03/27/2015   CLINICAL DATA:  Left PICC placement. Nausea and vomiting. Initial encounter.  EXAM: PORTABLE CHEST - 1 VIEW  COMPARISON:  Chest radiograph performed 03/26/2015  FINDINGS: The patient's left PICC is noted ending about the proximal SVC. An enteric tube is noted extending below the diaphragm.  The lungs are hypoexpanded. Bibasilar and right upper lung zone airspace opacities may reflect atelectasis or pneumonia. No pleural effusion or pneumothorax is seen  The cardiomediastinal silhouette is borderline normal in size. No acute osseous abnormalities are identified.  IMPRESSION: 1. Left PICC noted ending about the proximal SVC. 2. Lungs hypoexpanded. Bibasilar and right upper lung zone airspace opacities may reflect atelectasis or pneumonia.  These results were called by telephone at the time of interpretation on 03/27/2015 at 7:33 pm to Nursing in the [facility], who verbally acknowledged these results.   Electronically Signed   By: Garald Balding M.D.   On: 03/27/2015 19:34   Dg Chest Port 1 View  03/26/2015   CLINICAL DATA:  Atelectasis  EXAM: PORTABLE CHEST - 1 VIEW  COMPARISON:  Yesterday  FINDINGS: NG tube and left PICC are stable. Upper normal heart size. Low volumes. Bibasilar atelectasis is not significantly changed. No pneumothorax.  IMPRESSION: Stable bibasilar atelectasis.    Electronically Signed   By: Marybelle Killings M.D.   On: 03/26/2015 08:14   Dg Chest Port 1 View  03/25/2015   CLINICAL DATA:  Tachycardia and hypotension; concern for sepsis  EXAM: PORTABLE CHEST - 1 VIEW  COMPARISON:  May 27, 2014  FINDINGS: There is patchy atelectasis in both lung bases. The degree of inspiration is shallow. There is no frank airspace consolidation. Heart is upper normal in size with pulmonary vascularity within normal limits.  Central catheter tip is in the superior vena cava. Nasogastric tube tip and side port are in the distal stomach. No pneumothorax.  IMPRESSION: Bibasilar atelectasis. No pneumothorax. Degree of inspiration shallow.   Electronically Signed   By: Lowella Grip III M.D.   On: 03/25/2015 09:03   Dg Abd Portable 1v  03/24/2015   CLINICAL DATA:  Small-bowel obstruction  EXAM: PORTABLE ABDOMEN - 1 VIEW  COMPARISON:  03/23/2015  FINDINGS: NG tube is been placed with the tip in the gastric antrum  Small bowel dilatation shows mild interval improvement. Colon is decompressed with moderate stool in the right colon. Surgical clips in the gallbladder fossa.  IMPRESSION: Mild improvement in small bowel obstruction. NG tube in the gastric antrum.   Electronically Signed   By: Franchot Gallo M.D.   On: 03/24/2015 08:25   Dg Abd Portable 1v  03/23/2015   CLINICAL DATA:  Followup small bowel obstruction.  EXAM: PORTABLE ABDOMEN - 1 VIEW  COMPARISON:  03/22/2015  FINDINGS: Dilated loops small bowel are similar to the prior exam allowing for differences in radiographic technique different degrees of magnification.  There is a small amount of air in a nondistended colon.  IMPRESSION: 1. Persistent high-grade partial small bowel obstruction. No significant change from the previous day's study.   Electronically Signed   By: Lajean Manes M.D.   On: 03/23/2015 10:01   Dg Abd Portable 1v  03/22/2015   CLINICAL DATA:  81 year old female with recent small bowel obstruction. Initial  encounter.  EXAM: PORTABLE ABDOMEN - 1 VIEW  COMPARISON:  03/21/2015 and earlier, including CT Abdomen and Pelvis 03/18/2015.  FINDINGS: Portable AP supine view at 0517 hrs. Enteric tube has been removed. Recurrent dilated gas-filled small bowel loops in the mid abdomen up to 41 mm diameter. Abundant stool now at the splenic flexure. Decreased distal colon gas in the pelvis. Stable cholecystectomy clips. Stable abdominal and pelvic visceral contours. Grossly stable lung bases. Stable visualized osseous structures.  IMPRESSION: NG tube removed with recurrent small bowel obstruction.   Electronically Signed   By: Genevie Ann M.D.   On: 03/22/2015 07:42   Dg Abd Portable 1v  03/21/2015   CLINICAL DATA:  Small-bowel obstruction .  EXAM: PORTABLE ABDOMEN - 1 VIEW  COMPARISON:  03/20/2015 .  FINDINGS: NG tube noted with tip in the upper portion stomach. Slight advancement should be considered . Surgical clips right upper quadrant Soft tissue structures are unremarkable. Small-bowel distention has improved. No free air. Stool noted throughout the colon. No acute bony abnormality.  IMPRESSION: 1. NG tube noted with tip in the upper portion of the stomach. Advancement suggested.  2. Interim improvement of small bowel distention. Colonic gas pattern is normal with stool throughout the colon.  These results will be called to the ordering clinician or representative by the Radiologist Assistant, and communication documented in the PACS or zVision Dashboard.   Electronically Signed   By: Marcello Moores  Register   On: 03/21/2015 07:32   Dg Foot 2 Views Right  03/22/2015   CLINICAL DATA:  Dorsal right foot pain for 4 days, no known injury, initial encounter  EXAM: RIGHT FOOT - 2 VIEW  COMPARISON:  None.  FINDINGS: Degenerative changes are noted in the tarsal bones. No acute  fracture or dislocation is noted. A small calcaneal spur is noted.  IMPRESSION: Degenerative change without acute abnormality.   Electronically Signed   By: Inez Catalina M.D.   On: 03/22/2015 14:57   Dg Swallowing Func-speech Pathology  04/01/2015    Objective Swallowing Evaluation:    Patient Details  Name: AMOREE NEWLON MRN: [id number] Date of Birth: 21-Jul-1933  Today's Date: 04/01/2015 Time: SLP Start Time (ACUTE ONLY): 0835-SLP Stop Time (ACUTE ONLY): 4818 SLP Time Calculation (min) (ACUTE ONLY): 22 min  Past Medical History:  Past Medical History  Diagnosis Date  . CAD (coronary artery disease)     s/p stenting of LAD 1999- cath 5-08 EF normal LAD 30-40% restenosis. D1  50% D2 80% LCX & RCA minimal plaque  . HTN (hypertension)   . Hyperlipemia   . Anemia     iron deficiency  . Depression   . DVT (deep venous thrombosis)   . Gout   . Osteoporosis   . Pancreatitis   . GERD (gastroesophageal reflux disease)   . Renal insufficiency     Cr 1.2-1.3  . Obesity   . Diabetes mellitus   . Polyarthritis     DJD/ possible PMR  . Vitamin D deficiency   . B12 deficiency   . Tinnitus   . Anxiety   . Aneurysm, thoracic aortic   . Constipation   . Urinary frequency   . Vertigo   . Chronic back pain    Past Surgical History:  Past Surgical History  Procedure Laterality Date  . Hemorrhoid surgery    . Abdominal hysterectomy    . Cholecystectomy    . Tubal ligation    . Coronary angioplasty with stent placement  1999    LAD stent  . Cardiac catheterization  2008    L main 20%, LAD stent patent, D1 50%, D2 80% (small), RCA 20%, EF 55-60%   . Laparoscopy N/A 03/24/2015    Procedure: LAPAROSCOPY DIAGNOSTIC;  Surgeon: Johnathan Hausen, MD;   Location: [facility];  Service: General;  Laterality: N/A;  . Laparoscopic lysis of adhesions N/A 03/24/2015    Procedure: LAPAROSCOPIC LYSIS OF ADHESIONS;  Surgeon: Johnathan Hausen,  MD;  Location: [facility];  Service: General;  Laterality: N/A;  . Laparotomy N/A 03/24/2015    Procedure: LAPAROTOMY with decompression of bowel;  Surgeon: Johnathan Hausen, MD;  Location: [facility];  Service: General;  Laterality: N/A;   HPI:  Other Pertinent Information: Pt is a 81 year  old female admitted 03/18/15  with SBO secondary to adhesions. She failed conservative therapy and  underwent laparoscopic lysis of adhesions/laparotomy with bowel  decompression on 03/24/15. Her hospital course was complicated by sepsis  secondary to peritoneal contamination with transfer to the SDU on 03/25/15.  She also developed left-sided facial droop/right arm weakness 03/27/15 with  CT scan showing a 4 mm lacunar infarct in the left lentiform nucleus. MRI  failed to show any abnormality. After removal of NG for suction, MD  ordered MBS prior to initiating diet.   No Data Recorded  Assessment / Plan / Recommendation CHL IP CLINICAL IMPRESSIONS 04/01/2015  Therapy Diagnosis Mild pharyngeal phase dysphagia;Mild oral phase  dysphagia  Clinical Impression Mild oral dysphagia characterized by decreased lingual  formation of bolus, improved with straw sips and improving progressively  during exam. oropharyngeal dysphagia also mild with no penetration or  aspiration. Suspect mild standing secretions intially during exam as  pharyngeal residuals decreased and timing of swallow improved  with each  trial. Though swallow was initially delayed, by end of exam pt taking  large consecutive sips of thin with minimal delay and minimal residuals.  Recommend pt initiate a dys 3 (mechanical soft) diet with basic aspiration  precautions of upright posture and occasional second swallow to clear oral  cavity and orophrayngeal residuals. SLP will f/u for tolerance during  acute stay.       CHL IP TREATMENT RECOMMENDATION 04/01/2015  Treatment Recommendations Therapy as outlined in treatment plan below     CHL IP DIET RECOMMENDATION 04/01/2015  SLP Diet Recommendations Dysphagia 3 (Mech soft);Thin  Liquid Administration via (None)  Medication Administration Whole meds with liquid  Compensations Multiple dry swallows after each bite/sip  Postural Changes and/or Swallow Maneuvers (None)     CHL IP OTHER RECOMMENDATIONS 04/01/2015  Recommended  Consults (None)  Oral Care Recommendations Oral care BID  Other Recommendations (None)     CHL IP FOLLOW UP RECOMMENDATIONS 03/29/2015  Follow up Recommendations 24 hour supervision/assistance     CHL IP FREQUENCY AND DURATION 04/01/2015  Speech Therapy Frequency (ACUTE ONLY) min 2x/week  Treatment Duration 2 weeks     Pertinent Vitals/Pain NA    SLP Swallow Goals No flowsheet data found.  No flowsheet data found.    CHL IP REASON FOR REFERRAL 04/01/2015  Reason for Referral Objectively evaluate swallowing function     CHL IP ORAL PHASE 04/01/2015  Lips (None)  Tongue (None)  Mucous membranes (None)  Nutritional status (None)  Other (None)  Oxygen therapy (None)  Oral Phase Impaired  Oral - Pudding Teaspoon (None)  Oral - Pudding Cup (None)  Oral - Honey Teaspoon (None)  Oral - Honey Cup (None)  Oral - Honey Syringe (None)  Oral - Nectar Teaspoon (None)  Oral - Nectar Cup (None)  Oral - Nectar Straw (None)  Oral - Nectar Syringe (None)  Oral - Ice Chips (None)  Oral - Thin Teaspoon (None)  Oral - Thin Cup (None)  Oral - Thin Straw (None)  Oral - Thin Syringe (None)  Oral - Puree (None)  Oral - Mechanical Soft (None)  Oral - Regular (None)  Oral - Multi-consistency (None)  Oral - Pill (None)  Oral Phase - Comment (None)      CHL IP PHARYNGEAL PHASE 04/01/2015  Pharyngeal Phase Impaired  Pharyngeal - Pudding Teaspoon (None)  Penetration/Aspiration details (pudding teaspoon) (None)  Pharyngeal - Pudding Cup (None)  Penetration/Aspiration details (pudding cup) (None)  Pharyngeal - Honey Teaspoon (None)  Penetration/Aspiration details (honey teaspoon) (None)  Pharyngeal - Honey Cup (None)  Penetration/Aspiration details (honey cup) (None)  Pharyngeal - Honey Syringe (None)  Penetration/Aspiration details (honey syringe) (None)  Pharyngeal - Nectar Teaspoon (None)  Penetration/Aspiration details (nectar teaspoon) (None)  Pharyngeal - Nectar Cup (None)  Penetration/Aspiration details (nectar cup) (None)  Pharyngeal - Nectar  Straw (None)  Penetration/Aspiration details (nectar straw) (None)  Pharyngeal - Nectar Syringe (None)  Penetration/Aspiration details (nectar syringe) (None)  Pharyngeal - Ice Chips (None)  Penetration/Aspiration details (ice chips) (None)  Pharyngeal - Thin Teaspoon (None)  Penetration/Aspiration details (thin teaspoon) (None)  Pharyngeal - Thin Cup (None)  Penetration/Aspiration details (thin cup) (None)  Pharyngeal - Thin Straw (None)  Penetration/Aspiration details (thin straw) (None)  Pharyngeal - Thin Syringe (None)  Penetration/Aspiration details (thin syringe') (None)  Pharyngeal - Puree (None)  Penetration/Aspiration details (puree) (None)  Pharyngeal - Mechanical Soft (None)  Penetration/Aspiration details (mechanical soft) (None)  Pharyngeal - Regular (None)  Penetration/Aspiration details (regular) (None)  Pharyngeal - Multi-consistency (None)  Penetration/Aspiration details (multi-consistency) (None)  Pharyngeal - Pill (None)  Penetration/Aspiration details (pill) (None)  Pharyngeal Comment (None)      No flowsheet data found.  No flowsheet data found.        Herbie [place], [place] CCC-SLP [phone]  Lynann Beaver 04/01/2015, 10:31 AM    Ct Image Guided Drainage Percut Cath  Peritoneal Retroperit  04/04/2015   CLINICAL DATA:  81 year old with postoperative fluid collections. Concern for intra-abdominal abscesses.  EXAM: CT-GUIDED DRAIN PLACEMENT IN LEFT UPPER ABDOMINAL FLUID COLLECTION  CT-GUIDED DRAIN PLACEMENT IN LEFT LATERAL ABDOMINAL FLUID COLLECTION  Physician: Stephan Minister. Anselm Pancoast, MD  FLUOROSCOPY TIME:  None  MEDICATIONS: 4 mg versed, 100 mcg of fentanyl. A radiology nurse monitored the patient for moderate sedation.  ANESTHESIA/SEDATION: Moderate sedation time: 50 min  PROCEDURE: Informed consent was obtained for CT-guided drain placements. Patient was placed supine on the CT scanner. Images through the abdomen were obtained. The collections in the left upper abdomen and lateral left abdomen  were identified. The left side of the abdomen was prepped and draped in sterile fashion. The collection in the left upper abdomen and perisplenic region was initially targeted. Skin was anesthetized with 1% lidocaine. 18 gauge needle was directed into the fluid collection just posterior to the spleen and cloudy yellow fluid was aspirated. A stiff Amplatz wire was placed and the tract was dilated to accommodate a 10.2 [place] multipurpose drain. Catheter sutured to skin and attached to a suction bulb.  Attention was directed to the left lateral abdominal fluid collection. The left lateral abdomen was anesthetized with 1% lidocaine. 18 gauge needle directed into the fluid collection and cloudy yellow fluid was aspirated. Stiff Amplatz wire was placed and the tract was dilated to accommodate a 10.2 [place] multipurpose drain. Catheter sutured to skin and attached to a suction bulb.  FINDINGS: Left upper abdominal fluid collection in the perisplenic region. Drain was successfully placed just posterior to the spleen. There is a second collection in the lateral left mid abdomen. Drain successfully placed in this collection.  Estimated blood loss: Minimal  COMPLICATIONS: None  IMPRESSION: Placement of CT-guided drainage catheters in two left abdominal fluid collections. Cloudy yellow fluid was removed from both collections. Samples were sent for culture.   Electronically Signed   By: Markus Daft M.D.   On: 04/04/2015 08:32   Ct Image Guided Drainage Percut Cath  Peritoneal Retroperit  04/04/2015   CLINICAL DATA:  81 year old with postoperative fluid collections. Concern for intra-abdominal abscesses.  EXAM: CT-GUIDED DRAIN PLACEMENT IN LEFT UPPER ABDOMINAL FLUID COLLECTION  CT-GUIDED DRAIN PLACEMENT IN LEFT LATERAL ABDOMINAL FLUID COLLECTION  Physician: Stephan Minister. Anselm Pancoast, MD  FLUOROSCOPY TIME:  None  MEDICATIONS: 4 mg versed, 100 mcg of fentanyl. A radiology nurse monitored the patient for moderate sedation.   ANESTHESIA/SEDATION: Moderate sedation time: 50 min  PROCEDURE: Informed consent was obtained for CT-guided drain placements. Patient was placed supine on the CT scanner. Images through the abdomen were obtained. The collections in the left upper abdomen and lateral left abdomen were identified. The left side of the abdomen was prepped and draped in sterile fashion. The collection in the left upper abdomen and perisplenic region was initially targeted. Skin was anesthetized with 1% lidocaine. 18 gauge needle was directed into the fluid collection just posterior to the spleen and cloudy yellow fluid was aspirated. A stiff Amplatz wire was placed and the tract was dilated to accommodate a 10.2 [place] multipurpose drain. Catheter  sutured to skin and attached to a suction bulb.  Attention was directed to the left lateral abdominal fluid collection. The left lateral abdomen was anesthetized with 1% lidocaine. 18 gauge needle directed into the fluid collection and cloudy yellow fluid was aspirated. Stiff Amplatz wire was placed and the tract was dilated to accommodate a 10.2 [place] multipurpose drain. Catheter sutured to skin and attached to a suction bulb.  FINDINGS: Left upper abdominal fluid collection in the perisplenic region. Drain was successfully placed just posterior to the spleen. There is a second collection in the lateral left mid abdomen. Drain successfully placed in this collection.  Estimated blood loss: Minimal  COMPLICATIONS: None  IMPRESSION: Placement of CT-guided drainage catheters in two left abdominal fluid collections. Cloudy yellow fluid was removed from both collections. Samples were sent for culture.   Electronically Signed   By: Markus Daft M.D.   On: 04/04/2015 08:32    Microbiology: No results found for this or any previous visit (from the past 240 hour(s)).   Labs: Basic Metabolic Panel:  Recent Labs Lab 04/13/15 0851 04/14/15 0451 04/18/15 0930 04/19/15 0524  NA 134*  --  133*  136  K 4.9  --  5.5* 5.3*  CL 107  --  102 102  CO2 20*  --  23 24  GLUCOSE 157*  --  156* 142*  BUN 45*  --  53* 52*  CREATININE 0.68  --  0.77 0.79  CALCIUM 9.1  --  9.2 9.2  MG  --  1.7  --   --   PHOS  --  4.8*  --   --    Liver Function Tests:  Recent Labs Lab 04/18/15 0930  AST 69*  ALT 54  ALKPHOS 211*  BILITOT 0.4  PROT 7.9  ALBUMIN 2.5*    Recent Labs Lab 04/18/15 0930 04/19/15 0524  LIPASE 156* 142*   No results for input(s): AMMONIA in the last 168 hours. CBC:  Recent Labs Lab 04/13/15 0851 04/18/15 0930  WBC 8.4 10.3  HGB 10.2* 9.5*  HCT 31.8* 29.6*  MCV 83.7 83.9  PLT 541* 401*   Cardiac Enzymes: No results for input(s): CKTOTAL, CKMB, CKMBINDEX, TROPONINI in the last 168 hours. BNP: BNP (last 3 results) No results for input(s): BNP in the last 8760 hours.  ProBNP (last 3 results) No results for input(s): PROBNP in the last 8760 hours.  CBG:  Recent Labs Lab 04/18/15 2019 04/19/15 0015 04/19/15 0358 04/19/15 0748 04/19/15 1209  GLUCAP 104* 135* 153* 142* 136*       Signed:  DHUNGEL, NISHANT  [facility] 04/19/2015, 3:39 PM

## 2015-04-20 ENCOUNTER — Telehealth: Payer: Self-pay | Admitting: *Deleted

## 2015-04-20 DIAGNOSIS — E559 Vitamin D deficiency, unspecified: Secondary | ICD-10-CM | POA: Diagnosis not present

## 2015-04-20 DIAGNOSIS — R10817 Generalized abdominal tenderness: Secondary | ICD-10-CM | POA: Diagnosis not present

## 2015-04-20 DIAGNOSIS — A419 Sepsis, unspecified organism: Secondary | ICD-10-CM | POA: Diagnosis not present

## 2015-04-20 DIAGNOSIS — D62 Acute posthemorrhagic anemia: Secondary | ICD-10-CM | POA: Diagnosis not present

## 2015-04-20 DIAGNOSIS — Z9889 Other specified postprocedural states: Secondary | ICD-10-CM | POA: Diagnosis not present

## 2015-04-20 DIAGNOSIS — E46 Unspecified protein-calorie malnutrition: Secondary | ICD-10-CM | POA: Diagnosis not present

## 2015-04-20 DIAGNOSIS — R110 Nausea: Secondary | ICD-10-CM | POA: Diagnosis not present

## 2015-04-20 DIAGNOSIS — Z9861 Coronary angioplasty status: Secondary | ICD-10-CM | POA: Diagnosis not present

## 2015-04-20 DIAGNOSIS — K838 Other specified diseases of biliary tract: Secondary | ICD-10-CM | POA: Diagnosis not present

## 2015-04-20 DIAGNOSIS — G8929 Other chronic pain: Secondary | ICD-10-CM | POA: Diagnosis not present

## 2015-04-20 DIAGNOSIS — Z9049 Acquired absence of other specified parts of digestive tract: Secondary | ICD-10-CM | POA: Diagnosis not present

## 2015-04-20 DIAGNOSIS — K5669 Other intestinal obstruction: Secondary | ICD-10-CM | POA: Diagnosis not present

## 2015-04-20 DIAGNOSIS — Z431 Encounter for attention to gastrostomy: Secondary | ICD-10-CM | POA: Diagnosis not present

## 2015-04-20 DIAGNOSIS — F329 Major depressive disorder, single episode, unspecified: Secondary | ICD-10-CM | POA: Diagnosis not present

## 2015-04-20 DIAGNOSIS — E785 Hyperlipidemia, unspecified: Secondary | ICD-10-CM | POA: Diagnosis not present

## 2015-04-20 DIAGNOSIS — K2970 Gastritis, unspecified, without bleeding: Secondary | ICD-10-CM | POA: Diagnosis not present

## 2015-04-20 DIAGNOSIS — K219 Gastro-esophageal reflux disease without esophagitis: Secondary | ICD-10-CM | POA: Diagnosis not present

## 2015-04-20 DIAGNOSIS — K5900 Constipation, unspecified: Secondary | ICD-10-CM | POA: Diagnosis not present

## 2015-04-20 DIAGNOSIS — R278 Other lack of coordination: Secondary | ICD-10-CM | POA: Diagnosis not present

## 2015-04-20 DIAGNOSIS — R5381 Other malaise: Secondary | ICD-10-CM | POA: Diagnosis not present

## 2015-04-20 DIAGNOSIS — Z880 Allergy status to penicillin: Secondary | ICD-10-CM | POA: Diagnosis not present

## 2015-04-20 DIAGNOSIS — Z86718 Personal history of other venous thrombosis and embolism: Secondary | ICD-10-CM | POA: Diagnosis not present

## 2015-04-20 DIAGNOSIS — Z90710 Acquired absence of both cervix and uterus: Secondary | ICD-10-CM | POA: Diagnosis not present

## 2015-04-20 DIAGNOSIS — F419 Anxiety disorder, unspecified: Secondary | ICD-10-CM | POA: Diagnosis not present

## 2015-04-20 DIAGNOSIS — E119 Type 2 diabetes mellitus without complications: Secondary | ICD-10-CM | POA: Diagnosis not present

## 2015-04-20 DIAGNOSIS — I639 Cerebral infarction, unspecified: Secondary | ICD-10-CM | POA: Diagnosis not present

## 2015-04-20 DIAGNOSIS — I2510 Atherosclerotic heart disease of native coronary artery without angina pectoris: Secondary | ICD-10-CM | POA: Diagnosis not present

## 2015-04-20 DIAGNOSIS — Z862 Personal history of diseases of the blood and blood-forming organs and certain disorders involving the immune mechanism: Secondary | ICD-10-CM | POA: Diagnosis not present

## 2015-04-20 DIAGNOSIS — Z5189 Encounter for other specified aftercare: Secondary | ICD-10-CM | POA: Diagnosis not present

## 2015-04-20 DIAGNOSIS — K659 Peritonitis, unspecified: Secondary | ICD-10-CM | POA: Diagnosis not present

## 2015-04-20 DIAGNOSIS — R300 Dysuria: Secondary | ICD-10-CM | POA: Diagnosis not present

## 2015-04-20 DIAGNOSIS — E538 Deficiency of other specified B group vitamins: Secondary | ICD-10-CM | POA: Diagnosis not present

## 2015-04-20 DIAGNOSIS — Z931 Gastrostomy status: Secondary | ICD-10-CM | POA: Diagnosis not present

## 2015-04-20 DIAGNOSIS — Z48815 Encounter for surgical aftercare following surgery on the digestive system: Secondary | ICD-10-CM | POA: Diagnosis not present

## 2015-04-20 DIAGNOSIS — I6789 Other cerebrovascular disease: Secondary | ICD-10-CM | POA: Diagnosis not present

## 2015-04-20 DIAGNOSIS — D638 Anemia in other chronic diseases classified elsewhere: Secondary | ICD-10-CM | POA: Diagnosis not present

## 2015-04-20 DIAGNOSIS — K651 Peritoneal abscess: Secondary | ICD-10-CM | POA: Diagnosis not present

## 2015-04-20 DIAGNOSIS — M810 Age-related osteoporosis without current pathological fracture: Secondary | ICD-10-CM | POA: Diagnosis not present

## 2015-04-20 DIAGNOSIS — Z7982 Long term (current) use of aspirin: Secondary | ICD-10-CM | POA: Diagnosis not present

## 2015-04-20 DIAGNOSIS — E43 Unspecified severe protein-calorie malnutrition: Secondary | ICD-10-CM | POA: Diagnosis not present

## 2015-04-20 DIAGNOSIS — E782 Mixed hyperlipidemia: Secondary | ICD-10-CM | POA: Diagnosis not present

## 2015-04-20 DIAGNOSIS — N179 Acute kidney failure, unspecified: Secondary | ICD-10-CM | POA: Diagnosis not present

## 2015-04-20 DIAGNOSIS — I10 Essential (primary) hypertension: Secondary | ICD-10-CM | POA: Diagnosis not present

## 2015-04-20 DIAGNOSIS — R109 Unspecified abdominal pain: Secondary | ICD-10-CM | POA: Diagnosis not present

## 2015-04-20 DIAGNOSIS — E669 Obesity, unspecified: Secondary | ICD-10-CM | POA: Diagnosis not present

## 2015-04-20 DIAGNOSIS — M6281 Muscle weakness (generalized): Secondary | ICD-10-CM | POA: Diagnosis not present

## 2015-04-20 DIAGNOSIS — K5730 Diverticulosis of large intestine without perforation or abscess without bleeding: Secondary | ICD-10-CM | POA: Diagnosis not present

## 2015-04-20 DIAGNOSIS — Z79899 Other long term (current) drug therapy: Secondary | ICD-10-CM | POA: Diagnosis not present

## 2015-04-20 DIAGNOSIS — R1030 Lower abdominal pain, unspecified: Secondary | ICD-10-CM | POA: Diagnosis not present

## 2015-04-20 LAB — GLUCOSE, CAPILLARY
GLUCOSE-CAPILLARY: 143 mg/dL — AB (ref 65–99)
GLUCOSE-CAPILLARY: 135 mg/dL — AB (ref 65–99)
GLUCOSE-CAPILLARY: 167 mg/dL — AB (ref 65–99)
Glucose-Capillary: 122 mg/dL — ABNORMAL HIGH (ref 65–99)

## 2015-04-20 NOTE — Progress Notes (Signed)
[facility] called for 1:30pm pickup.      Dusti Tetro, LCSW [facility] Clinical Social Worker cell #: [phone]  

## 2015-04-20 NOTE — Progress Notes (Signed)
Facility called and report given to RN.  Made RN aware of new peg tube placement.  All questions answered.  Yellow packet given to ptar.  Pt wheeled out on stretched. VSS.  Pt family made aware that pt is being transported to facility.

## 2015-04-20 NOTE — Telephone Encounter (Signed)
Pt was on tcm list d/c 04/20/15 but was sent to skill nursing facility. Did not make tcm appt...Raechel Chute

## 2015-04-20 NOTE — Clinical Social Work Placement (Signed)
Patient is set to discharge to [facility] today. Patient & daughter, [name] aware. Discharge packet given to RN, Florentina Addison. [facility] called for transport when room is ready at [facility].     Lincoln Maxin, LCSW [facility] Clinical Social Worker cell #: [phone]    CLINICAL SOCIAL WORK PLACEMENT  NOTE  Date:  04/20/2015  Patient Details  Name: [name] MRN: [id number] Date of Birth: 11-13-1932  Clinical Social Work is seeking post-discharge placement for this patient at the Skilled  Nursing Facility level of care (*CSW will initial, date and re-position this form in  chart as items are completed):  Yes   Patient/family provided with [facility] Clinical Social Work Department's list of facilities offering this level of care within the geographic area requested by the patient (or if unable, by the patient's family).  Yes   Patient/family informed of their freedom to choose among providers that offer the needed level of care, that participate in Medicare, Medicaid or managed care program needed by the patient, have an available bed and are willing to accept the patient.  Yes   Patient/family informed of [facility]'s ownership interest in [facility] and [facility], as well as of the fact that they are under no obligation to receive care at these facilities.  PASRR submitted to EDS on 03/31/15     PASRR number received on 03/31/15     Existing PASRR number confirmed on       FL2 transmitted to all facilities in geographic area requested by pt/family on 03/31/15     FL2 transmitted to all facilities within larger geographic area on       Patient informed that his/her managed care company has contracts with or will negotiate with certain facilities, including the following:        Yes   Patient/family informed of bed offers received.  Patient chooses bed at [facility]     Physician recommends and patient chooses bed at      Patient to be  transferred to [facility] on 04/20/15.  Patient to be transferred to facility by PTAR     Patient family notified on 04/20/15 of transfer.  Name of family member notified:  patient's daughter, [name]      PHYSICIAN       Additional Comment:    _______________________________________________ Arlyss Repress, LCSW 04/20/2015, 10:53 AM

## 2015-04-21 ENCOUNTER — Encounter: Payer: Self-pay | Admitting: Adult Health

## 2015-04-21 ENCOUNTER — Non-Acute Institutional Stay (SKILLED_NURSING_FACILITY): Payer: Commercial Managed Care - HMO | Admitting: Adult Health

## 2015-04-21 DIAGNOSIS — K219 Gastro-esophageal reflux disease without esophagitis: Secondary | ICD-10-CM

## 2015-04-21 DIAGNOSIS — K56609 Unspecified intestinal obstruction, unspecified as to partial versus complete obstruction: Secondary | ICD-10-CM

## 2015-04-21 DIAGNOSIS — E119 Type 2 diabetes mellitus without complications: Secondary | ICD-10-CM

## 2015-04-21 DIAGNOSIS — E43 Unspecified severe protein-calorie malnutrition: Secondary | ICD-10-CM | POA: Diagnosis not present

## 2015-04-21 DIAGNOSIS — I10 Essential (primary) hypertension: Secondary | ICD-10-CM

## 2015-04-21 DIAGNOSIS — I639 Cerebral infarction, unspecified: Secondary | ICD-10-CM

## 2015-04-21 DIAGNOSIS — N3281 Overactive bladder: Secondary | ICD-10-CM

## 2015-04-21 DIAGNOSIS — K5669 Other intestinal obstruction: Secondary | ICD-10-CM | POA: Diagnosis not present

## 2015-04-21 DIAGNOSIS — D62 Acute posthemorrhagic anemia: Secondary | ICD-10-CM

## 2015-04-21 DIAGNOSIS — R5381 Other malaise: Secondary | ICD-10-CM

## 2015-04-22 ENCOUNTER — Encounter: Payer: Self-pay | Admitting: Internal Medicine

## 2015-04-22 ENCOUNTER — Non-Acute Institutional Stay: Payer: Commercial Managed Care - HMO | Admitting: Internal Medicine

## 2015-04-22 DIAGNOSIS — K2900 Acute gastritis without bleeding: Secondary | ICD-10-CM | POA: Diagnosis not present

## 2015-04-22 DIAGNOSIS — E119 Type 2 diabetes mellitus without complications: Secondary | ICD-10-CM | POA: Diagnosis not present

## 2015-04-22 DIAGNOSIS — E46 Unspecified protein-calorie malnutrition: Secondary | ICD-10-CM | POA: Diagnosis not present

## 2015-04-22 DIAGNOSIS — K5900 Constipation, unspecified: Secondary | ICD-10-CM

## 2015-04-22 DIAGNOSIS — I10 Essential (primary) hypertension: Secondary | ICD-10-CM | POA: Diagnosis not present

## 2015-04-22 DIAGNOSIS — R300 Dysuria: Secondary | ICD-10-CM | POA: Diagnosis not present

## 2015-04-22 DIAGNOSIS — I639 Cerebral infarction, unspecified: Secondary | ICD-10-CM | POA: Diagnosis not present

## 2015-04-22 DIAGNOSIS — R5381 Other malaise: Secondary | ICD-10-CM | POA: Diagnosis not present

## 2015-04-22 DIAGNOSIS — K5669 Other intestinal obstruction: Secondary | ICD-10-CM

## 2015-04-22 DIAGNOSIS — K56609 Unspecified intestinal obstruction, unspecified as to partial versus complete obstruction: Secondary | ICD-10-CM

## 2015-04-22 DIAGNOSIS — D62 Acute posthemorrhagic anemia: Secondary | ICD-10-CM

## 2015-04-22 NOTE — Progress Notes (Signed)
[name]  PCP: [name], MD  Code Status: Full Code   Allergies  Allergen Reactions  . Amlodipine Besylate     REACTION: dizzy  . Atenolol     REACTION: fatigue  . Benazepril     cough  . Benicar [Olmesartan Medoxomil]     HA  . Cozaar     nausea  . Hydrochlorothiazide W-Triamterene     REACTION: dizzy  . Hydrocodone     REACTION: HA  . Hydroxyzine Pamoate   . Iodine   . Lisinopril     REACTION: tired, cough  . Penicillins Itching    tolerates cephalosporins OK  . Pravastatin     myalgias  . Prednisolone     My stomach hurts  . Tramadol Hcl     REACTION: HA    Chief Complaint  Patient presents with  . New Admit To SNF    New Admission      HPI:  81 year old patient is here for short term rehabilitation post hospital admission from 03/18/15-04/20/15 with small bowel obstruction secondary to adhesions. She underwent exlap, lysis of adhesions and decompression on 03/24/15. She then developed sepsis from peritonitis and was started on antibiotics . She then developed acute left lentiform nucleus CVA on 03/27/15. Neurology was consulted, stroke workup was negative, she was continued on full dose aspirin, no further recommendation by neurology. On 04/04/15 she underwent percutaneous drainage of intraperitoneal abscess and placement of abscess drain. Improved fluid collection was noted on repeat ct scan. Drain was then removed and she had g tube placement on 04/15/15. She is now on tube feed. She had 2 u prbc transfusion during hospital stay for post op blood loss anemia. She has PMH of CAD, HTN, DM, HLD. She is seen in her room today. She feels bloated and has some nausea today. Last bowel movement was 2 days back. Ate some of her breakfast this am. She has been having burning and pain with urination x 3 days.   Review of Systems:  Constitutional: Negative for fever, chills, diaphoresis. Positive for fatigue. HENT: Negative for headache, congestion,  nasal discharge Eyes: Negative for eye pain, blurred vision, double vision and discharge.  Respiratory: Negative for cough, shortness of breath and wheezing.   Cardiovascular: Negative for chest pain, palpitations, leg swelling.  Gastrointestinal: Negative for heartburn, vomiting. Poor appetite Genitourinary: Negative for flank pain.  Musculoskeletal: Negative for back pain, falls Skin: Negative for itching, rash.  Neurological: Negative for dizziness, tingling, focal weakness Psychiatric/Behavioral: Negative for depression   Past Medical History  Diagnosis Date  . CAD (coronary artery disease)     s/p stenting of LAD 1999- cath 5-08 EF normal LAD 30-40% restenosis. D1 50% D2 80% LCX & RCA minimal plaque  . HTN (hypertension)   . Hyperlipemia   . Anemia     iron deficiency  . Depression   . DVT (deep venous thrombosis)   . Gout   . Osteoporosis   . Pancreatitis   . GERD (gastroesophageal reflux disease)   . Renal insufficiency     Cr 1.2-1.3  . Obesity   . Diabetes mellitus   . Polyarthritis     DJD/ possible PMR  . Vitamin D deficiency   . B12 deficiency   . Tinnitus   . Anxiety   . Aneurysm, thoracic aortic   . Constipation   . Urinary frequency   . Vertigo   . Chronic back pain  Past Surgical History  Procedure Laterality Date  . Hemorrhoid surgery    . Abdominal hysterectomy    . Cholecystectomy    . Tubal ligation    . Coronary angioplasty with stent placement  1999    LAD stent  . Cardiac catheterization  2008    L main 20%, LAD stent patent, D1 50%, D2 80% (small), RCA 20%, EF 55-60%  . Laparoscopy N/A 03/24/2015    Procedure: LAPAROSCOPY DIAGNOSTIC;  Surgeon: Luretha Murphy, MD;  Location: [facility];  Service: General;  Laterality: N/A;  . Laparoscopic lysis of adhesions N/A 03/24/2015    Procedure: LAPAROSCOPIC LYSIS OF ADHESIONS;  Surgeon: Luretha Murphy, MD;  Location: [facility];  Service: General;  Laterality: N/A;  . Laparotomy N/A 03/24/2015     Procedure: LAPAROTOMY with decompression of bowel;  Surgeon: Luretha Murphy, MD;  Location: [facility];  Service: General;  Laterality: N/A;   Social History:   reports that she has never smoked. She does not have any smokeless tobacco history on file. She reports that she does not drink alcohol or use illicit drugs.  Family History  Problem Relation Age of Onset  . Adopted: Yes  . Diabetes Mother   . Hypertension Father     Medications: Patient's Medications  New Prescriptions   No medications on file  Previous Medications   ACETAMINOPHEN (TYLENOL) 650 MG CR TABLET    Take 1,300 mg by mouth every 8 (eight) hours as needed for pain.    ALUM & MAG HYDROXIDE-SIMETH (MAALOX/MYLANTA) 200-200-20 MG/5ML SUSPENSION    Take 30 mLs by mouth every 6 (six) hours as needed for indigestion or heartburn (or bloating).   ASPIRIN 325 MG TABLET    Take 325 mg by mouth daily.   CHOLECALCIFEROL (VITAMIN D) 2000 UNITS CAPS    Take by mouth daily.   FUROSEMIDE (LASIX) 20 MG TABLET    Take 1 tablet (20 mg total) by mouth daily as needed for edema.   HYDRALAZINE (APRESOLINE) 25 MG TABLET    Take 1 tablet (25 mg total) by mouth every 8 (eight) hours.   LIP BALM (CARMEX) OINTMENT    Apply 1 application topically 2 (two) times daily.   METFORMIN (GLUCOPHAGE) 500 MG TABLET    TAKE 1 TABLET TWICE DAILY  WITH  A  MEAL   MULTIPLE VITAMINS-MINERALS (DECUBI-VITE PO)    Take by mouth. To promote wound healing   NUTRITIONAL SUPPLEMENTS (FEEDING SUPPLEMENT, JEVITY 1.2 CAL,) LIQD    Place 1,000 mLs into feeding tube continuous.   OVER THE COUNTER MEDICATION    Apply 1 application topically 2 (two) times daily as needed (dryness). Diabetic Lotion.   OXYBUTYNIN (DITROPAN) 5 MG TABLET    Take 1 tablet (5 mg total) by mouth 2 (two) times daily.   OXYCODONE (OXY IR/ROXICODONE) 5 MG IMMEDIATE RELEASE TABLET    Take 0.5 tablets (2.5 mg total) by mouth every 6 (six) hours as needed for moderate pain, severe pain or breakthrough pain.     PANTOPRAZOLE (PROTONIX) 40 MG TABLET    Take 1 tablet (40 mg total) by mouth daily.   POLYETHYL GLYCOL-PROPYL GLYCOL (SYSTANE OP)    Apply 1-2 drops to eye daily as needed (dry eyes).   SUCRALFATE (CARAFATE) 1 GM/10ML SUSPENSION    Take 10 mLs (1 g total) by mouth 4 (four) times daily -  with meals and at bedtime.  Modified Medications   No medications on file  Discontinued Medications   CHOLECALCIFEROL (VITAMIN D)  1000 UNITS TABLET    Take 2 tablets (2,000 Units total) by mouth daily.   CYANOCOBALAMIN (VITAMIN B-12) 1000 MCG SUBL    Place 1 tablet (1,000 mcg total) under the tongue daily.   FEEDING SUPPLEMENT, ENSURE ENLIVE, (ENSURE ENLIVE) LIQD    Take 237 mLs by mouth 2 (two) times daily between meals.     Physical Exam: Filed Vitals:   04/22/15 1018  BP: 159/82  Pulse: 106  Temp: 97 F (36.1 C)  TempSrc: Oral  Resp: 19  Height: 5\' 4"  (1.626 m)  Weight: 139 lb 12.8 oz (63.413 kg)  SpO2: 96%    General- elderly female, in no acute distress Head- normocephalic, atraumatic Throat- moist mucus membrane Eyes- PERRLA, EOMI, no pallor, no icterus, no discharge, normal conjunctiva, normal sclera Neck- no cervical lymphadenopathy Cardiovascular- normal s1,s2, no murmurs Respiratory- bilateral clear to auscultation, no wheeze, no rhonchi, no crackles, no use of accessory muscles Abdomen- bowel sounds present, soft, tender in epigastric area, not distended, no guarding or rigidity Musculoskeletal- able to move all 4 extremities, generalized weakness, no leg edema Neurological- no focal deficit, alert and oriented Skin- warm and dry Psychiatry- normal mood and affect    Labs reviewed: Basic Metabolic Panel:  Recent Labs  0500 04/11/15 0447 04/13/15 0851 04/14/15 0451 04/18/15 0930 04/19/15 0524  NA 134* 135 134*  --  133* 136  K 4.1 3.9 4.9  --  5.5* 5.3*  CL 106 108 107  --  102 102  CO2 20* 19* 20*  --  23 24  GLUCOSE 131* 104* 157*  --  156* 142*  BUN  40* 42* 45*  --  53* 52*  CREATININE 0.71 0.67 0.68  --  0.77 0.79  CALCIUM 9.0 9.2 9.1  --  9.2 9.2  MG 1.5* 1.9  --  1.7  --   --   PHOS 4.2 4.2  --  4.8*  --   --    Liver Function Tests:  Recent Labs  04/07/15 0454 04/09/15 0503 04/18/15 0930  AST 43* 39 69*  ALT 32 31 54  ALKPHOS 204* 199* 211*  BILITOT 0.4 0.4 0.4  PROT 7.0 7.2 7.9  ALBUMIN 2.0* 1.9* 2.5*    Recent Labs  04/09/15 0503 04/18/15 0930 04/19/15 0524  LIPASE 105* 156* 142*   No results for input(s): AMMONIA in the last 8760 hours. CBC:  Recent Labs  04/04/15 0650  04/07/15 0454  04/11/15 0447 04/13/15 0851 04/18/15 0930  WBC 16.7*  < > 13.9*  < > 8.1 8.4 10.3  NEUTROABS 15.3*  --  12.4*  --  6.2  --   --   HGB 8.8*  < > 7.8*  < > 7.7* 10.2* 9.5*  HCT 28.3*  < > 25.8*  < > 24.4* 31.8* 29.6*  MCV 83.2  < > 82.4  < > 81.1 83.7 83.9  PLT 465*  < > 506*  < > 618* 541* 401*  < > = values in this interval not displayed. Cardiac Enzymes:  Recent Labs  03/25/15 1315 03/25/15 2223 03/26/15 0510  TROPONINI 0.03 0.03 0.03   BNP: Invalid input(s): POCBNP CBG:  Recent Labs  04/20/15 0351 04/20/15 0744 04/20/15 1224  GLUCAP 143* 135* 167*    Assessment/Plan  Physical deconditioning Will have her work with physical therapy and occupational therapy team to help with gait training and muscle strengthening exercises.fall precautions. Skin care. Encourage to be out of bed.   Protein calorie malnutrition  S/p peg tube, continue tube feed for now with feeding supplement, monitor weight. Continue b12 and vitamin d supplement  Dysuria Send u/a with c/s for now, hydration encouraged  Constipation With recent bowel obstruction, start colace 100 mg bid with miralax 17 g bid and reassess. No signs of bowel obstruction at present  HTN Elevated bp reading. Increase hydralazine to 50 mg tid for now with prn lasix, monitor bp q shift, monitor bmp  Small bowel obstruction From adhesions. S/p ex lap  and lysis of adhesions. Continue oxyIR 5 mg half a tab q6h prn pain. Added bowel regimen as above. Monitor for incison site infection, continue skin care. Has follow up with surgery  Acute gastritis Continue protonix but increase to 40 mg bid from daily. Continue carafate 1g qid and monitor.  Acute CVA Continue aspirin 325 mg daily and to work with therapy team  Blood loss anemia Post op with 2 u prbc transfusion, monitor h&h  Dm type 2 Lab Results  Component Value Date   HGBA1C 6.3* 03/28/2015   Monitor cbg, continue metformin 500 mg bid, if nausea persists, consider changing metformin to other oral hypoglycemics    Goals of care: short term rehabilitation   Labs/tests ordered: cbc, cmp, u/a with c/s  Family/ staff Communication: reviewed care plan with patient and nursing supervisor    Oneal Grout, MD  [facility] [phone] (Monday-Friday 8 am - 5 pm) [phone] (afterhours)

## 2015-04-25 LAB — BASIC METABOLIC PANEL
BUN: 36 mg/dL — AB (ref 4–21)
CREATININE: 0.9 mg/dL (ref 0.5–1.1)
Glucose: 77 mg/dL
POTASSIUM: 4.6 mmol/L (ref 3.4–5.3)
SODIUM: 134 mmol/L — AB (ref 137–147)

## 2015-05-09 ENCOUNTER — Encounter (HOSPITAL_COMMUNITY): Payer: Self-pay | Admitting: *Deleted

## 2015-05-09 ENCOUNTER — Emergency Department (HOSPITAL_COMMUNITY): Payer: Commercial Managed Care - HMO

## 2015-05-09 ENCOUNTER — Emergency Department (HOSPITAL_COMMUNITY)
Admission: EM | Admit: 2015-05-09 | Discharge: 2015-05-10 | Disposition: A | Discharge status: Home / Self Care | Payer: Commercial Managed Care - HMO | Attending: Emergency Medicine | Admitting: Emergency Medicine

## 2015-05-09 DIAGNOSIS — R109 Unspecified abdominal pain: Secondary | ICD-10-CM | POA: Insufficient documentation

## 2015-05-09 DIAGNOSIS — Z90710 Acquired absence of both cervix and uterus: Secondary | ICD-10-CM | POA: Insufficient documentation

## 2015-05-09 DIAGNOSIS — E559 Vitamin D deficiency, unspecified: Secondary | ICD-10-CM | POA: Diagnosis not present

## 2015-05-09 DIAGNOSIS — F329 Major depressive disorder, single episode, unspecified: Secondary | ICD-10-CM | POA: Insufficient documentation

## 2015-05-09 DIAGNOSIS — Z7982 Long term (current) use of aspirin: Secondary | ICD-10-CM | POA: Insufficient documentation

## 2015-05-09 DIAGNOSIS — Z862 Personal history of diseases of the blood and blood-forming organs and certain disorders involving the immune mechanism: Secondary | ICD-10-CM | POA: Insufficient documentation

## 2015-05-09 DIAGNOSIS — E785 Hyperlipidemia, unspecified: Secondary | ICD-10-CM | POA: Insufficient documentation

## 2015-05-09 DIAGNOSIS — F419 Anxiety disorder, unspecified: Secondary | ICD-10-CM | POA: Insufficient documentation

## 2015-05-09 DIAGNOSIS — Z9861 Coronary angioplasty status: Secondary | ICD-10-CM | POA: Insufficient documentation

## 2015-05-09 DIAGNOSIS — M810 Age-related osteoporosis without current pathological fracture: Secondary | ICD-10-CM | POA: Diagnosis not present

## 2015-05-09 DIAGNOSIS — G8929 Other chronic pain: Secondary | ICD-10-CM | POA: Insufficient documentation

## 2015-05-09 DIAGNOSIS — I2510 Atherosclerotic heart disease of native coronary artery without angina pectoris: Secondary | ICD-10-CM | POA: Insufficient documentation

## 2015-05-09 DIAGNOSIS — E669 Obesity, unspecified: Secondary | ICD-10-CM | POA: Diagnosis not present

## 2015-05-09 DIAGNOSIS — R1030 Lower abdominal pain, unspecified: Secondary | ICD-10-CM | POA: Diagnosis not present

## 2015-05-09 DIAGNOSIS — I10 Essential (primary) hypertension: Secondary | ICD-10-CM | POA: Insufficient documentation

## 2015-05-09 DIAGNOSIS — Z880 Allergy status to penicillin: Secondary | ICD-10-CM | POA: Insufficient documentation

## 2015-05-09 DIAGNOSIS — Z9049 Acquired absence of other specified parts of digestive tract: Secondary | ICD-10-CM | POA: Insufficient documentation

## 2015-05-09 DIAGNOSIS — E538 Deficiency of other specified B group vitamins: Secondary | ICD-10-CM | POA: Insufficient documentation

## 2015-05-09 DIAGNOSIS — K219 Gastro-esophageal reflux disease without esophagitis: Secondary | ICD-10-CM | POA: Insufficient documentation

## 2015-05-09 DIAGNOSIS — Z79899 Other long term (current) drug therapy: Secondary | ICD-10-CM | POA: Insufficient documentation

## 2015-05-09 DIAGNOSIS — Z9889 Other specified postprocedural states: Secondary | ICD-10-CM | POA: Insufficient documentation

## 2015-05-09 DIAGNOSIS — Z86718 Personal history of other venous thrombosis and embolism: Secondary | ICD-10-CM | POA: Insufficient documentation

## 2015-05-09 LAB — CBC WITH DIFFERENTIAL/PLATELET
Basophils Absolute: 0 10*3/uL (ref 0.0–0.1)
Basophils Relative: 0 % (ref 0–1)
Eosinophils Absolute: 0.2 10*3/uL (ref 0.0–0.7)
Eosinophils Relative: 2 % (ref 0–5)
HCT: 31.3 % — ABNORMAL LOW (ref 36.0–46.0)
Hemoglobin: 9.8 g/dL — ABNORMAL LOW (ref 12.0–15.0)
Lymphocytes Relative: 23 % (ref 12–46)
Lymphs Abs: 2.2 10*3/uL (ref 0.7–4.0)
MCH: 26.6 pg (ref 26.0–34.0)
MCHC: 31.3 g/dL (ref 30.0–36.0)
MCV: 84.8 fL (ref 78.0–100.0)
Monocytes Absolute: 0.9 10*3/uL (ref 0.1–1.0)
Monocytes Relative: 10 % (ref 3–12)
Neutro Abs: 6.1 10*3/uL (ref 1.7–7.7)
Neutrophils Relative %: 65 % (ref 43–77)
Platelets: 413 10*3/uL — ABNORMAL HIGH (ref 150–400)
RBC: 3.69 MIL/uL — ABNORMAL LOW (ref 3.87–5.11)
RDW: 19 % — ABNORMAL HIGH (ref 11.5–15.5)
WBC: 9.4 10*3/uL (ref 4.0–10.5)

## 2015-05-09 LAB — COMPREHENSIVE METABOLIC PANEL
ALT: 13 U/L — ABNORMAL LOW (ref 14–54)
AST: 24 U/L (ref 15–41)
Albumin: 3.3 g/dL — ABNORMAL LOW (ref 3.5–5.0)
Alkaline Phosphatase: 111 U/L (ref 38–126)
Anion gap: 9 (ref 5–15)
BUN: 22 mg/dL — ABNORMAL HIGH (ref 6–20)
CO2: 24 mmol/L (ref 22–32)
Calcium: 9.3 mg/dL (ref 8.9–10.3)
Chloride: 98 mmol/L — ABNORMAL LOW (ref 101–111)
Creatinine, Ser: 0.74 mg/dL (ref 0.44–1.00)
GFR calc Af Amer: >60 mL/min (ref >60)
GFR calc non Af Amer: >60 mL/min (ref >60)
Glucose, Bld: 97 mg/dL (ref 65–99)
Potassium: 3.8 mmol/L (ref 3.5–5.1)
Sodium: 131 mmol/L — ABNORMAL LOW (ref 135–145)
Total Bilirubin: 0.5 mg/dL (ref 0.3–1.2)
Total Protein: 8 g/dL (ref 6.5–8.1)

## 2015-05-09 LAB — LIPASE, BLOOD: Lipase: 44 U/L (ref 22–51)

## 2015-05-09 LAB — I-STAT CG4 LACTIC ACID, ED: Lactic Acid, Venous: 1.04 mmol/L (ref 0.5–2.0)

## 2015-05-09 MED ORDER — MORPHINE SULFATE 4 MG/ML IJ SOLN
4.0000 mg | Freq: Once | INTRAMUSCULAR | Status: AC
  Administered 2015-05-09: 4 mg via INTRAVENOUS
  Filled 2015-05-09: qty 1

## 2015-05-09 MED ORDER — ONDANSETRON HCL 4 MG/2ML IJ SOLN
4.0000 mg | Freq: Once | INTRAMUSCULAR | Status: AC
  Administered 2015-05-09: 4 mg via INTRAVENOUS
  Filled 2015-05-09: qty 2

## 2015-05-09 NOTE — ED Notes (Signed)
Per EMS pt from [facility], been having lower abdominal pain "all day", got worse around 6 pm, pt states it feels like she is about to bust, pt has nausea x 2 times today, denies vomiting or diarrhea, staff at [facility] checked for impaction and was no impaction, last BM per pt was Saturday, pt states she was here 3 weeks ago and had surgery for twisted bowl, states pain now is similar but worse. PEG tube in place, [facility] got only 30cc residual. Tender all over abdomen.

## 2015-05-09 NOTE — ED Notes (Signed)
Pt states she had abdominal surgery for twisted bowl 4-6 weeks ago she can't remember, states she started having abdominal pain today after therapy, states more on L side, pt thinks its from her PEG tube, pt states she feels like she's about to burst, pt states she had large BM on Saturday. Pt states pain 10/10, positive nausea, denies vomiting or diarrhea.

## 2015-05-09 NOTE — ED Notes (Signed)
Bed: WA20 Expected date:  Expected time:  Means of arrival:  Comments: EMS - 81 y/o Abd pain

## 2015-05-10 ENCOUNTER — Emergency Department (HOSPITAL_COMMUNITY): Payer: Commercial Managed Care - HMO

## 2015-05-10 DIAGNOSIS — E669 Obesity, unspecified: Secondary | ICD-10-CM | POA: Diagnosis not present

## 2015-05-10 DIAGNOSIS — I2510 Atherosclerotic heart disease of native coronary artery without angina pectoris: Secondary | ICD-10-CM | POA: Diagnosis not present

## 2015-05-10 DIAGNOSIS — E785 Hyperlipidemia, unspecified: Secondary | ICD-10-CM | POA: Diagnosis not present

## 2015-05-10 DIAGNOSIS — K838 Other specified diseases of biliary tract: Secondary | ICD-10-CM | POA: Diagnosis not present

## 2015-05-10 DIAGNOSIS — K5730 Diverticulosis of large intestine without perforation or abscess without bleeding: Secondary | ICD-10-CM | POA: Diagnosis not present

## 2015-05-10 DIAGNOSIS — R109 Unspecified abdominal pain: Secondary | ICD-10-CM | POA: Diagnosis not present

## 2015-05-10 DIAGNOSIS — M810 Age-related osteoporosis without current pathological fracture: Secondary | ICD-10-CM | POA: Diagnosis not present

## 2015-05-10 DIAGNOSIS — F329 Major depressive disorder, single episode, unspecified: Secondary | ICD-10-CM | POA: Diagnosis not present

## 2015-05-10 DIAGNOSIS — E559 Vitamin D deficiency, unspecified: Secondary | ICD-10-CM | POA: Diagnosis not present

## 2015-05-10 DIAGNOSIS — I10 Essential (primary) hypertension: Secondary | ICD-10-CM | POA: Diagnosis not present

## 2015-05-10 DIAGNOSIS — K219 Gastro-esophageal reflux disease without esophagitis: Secondary | ICD-10-CM | POA: Diagnosis not present

## 2015-05-10 LAB — URINE MICROSCOPIC-ADD ON

## 2015-05-10 LAB — URINALYSIS, ROUTINE W REFLEX MICROSCOPIC
Bilirubin Urine: NEGATIVE
Glucose, UA: NEGATIVE mg/dL
Hgb urine dipstick: NEGATIVE
Ketones, ur: NEGATIVE mg/dL
Nitrite: NEGATIVE
Protein, ur: NEGATIVE mg/dL
Specific Gravity, Urine: 1.009 (ref 1.005–1.030)
Urobilinogen, UA: 1 mg/dL (ref 0.0–1.0)
pH: 7.5 (ref 5.0–8.0)

## 2015-05-10 MED ORDER — MORPHINE SULFATE 4 MG/ML IJ SOLN
4.0000 mg | Freq: Once | INTRAMUSCULAR | Status: AC
  Administered 2015-05-10: 4 mg via INTRAVENOUS
  Filled 2015-05-10: qty 1

## 2015-05-10 NOTE — ED Provider Notes (Signed)
Nursing notes and vitals signs, including pulse oximetry, reviewed.  Summary of this visit's results, reviewed by myself:  Labs:  Results for orders placed or performed during the hospital encounter of 05/09/15 (from the past 24 hour(s))  CBC with Differential     Status: Abnormal   Collection Time: 05/09/15 11:04 PM  Result Value Ref Range   WBC 9.4 4.0 - 10.5 K/uL   RBC 3.69 (L) 3.87 - 5.11 MIL/uL   Hemoglobin 9.8 (L) 12.0 - 15.0 g/dL   HCT 65.7 (L) 84.6 - 96.2 %   MCV 84.8 78.0 - 100.0 fL   MCH 26.6 26.0 - 34.0 pg   MCHC 31.3 30.0 - 36.0 g/dL   RDW 95.2 (H) 84.1 - 32.4 %   Platelets 413 (H) 150 - 400 K/uL   Neutrophils Relative % 65 43 - 77 %   Lymphocytes Relative 23 12 - 46 %   Monocytes Relative 10 3 - 12 %   Eosinophils Relative 2 0 - 5 %   Basophils Relative 0 0 - 1 %   Neutro Abs 6.1 1.7 - 7.7 K/uL   Lymphs Abs 2.2 0.7 - 4.0 K/uL   Monocytes Absolute 0.9 0.1 - 1.0 K/uL   Eosinophils Absolute 0.2 0.0 - 0.7 K/uL   Basophils Absolute 0.0 0.0 - 0.1 K/uL   Smear Review MORPHOLOGY UNREMARKABLE   Comprehensive metabolic panel     Status: Abnormal   Collection Time: 05/09/15 11:04 PM  Result Value Ref Range   Sodium 131 (L) 135 - 145 mmol/L   Potassium 3.8 3.5 - 5.1 mmol/L   Chloride 98 (L) 101 - 111 mmol/L   CO2 24 22 - 32 mmol/L   Glucose, Bld 97 65 - 99 mg/dL   BUN 22 (H) 6 - 20 mg/dL   Creatinine, Ser 4.01 0.44 - 1.00 mg/dL   Calcium 9.3 8.9 - 02.7 mg/dL   Total Protein 8.0 6.5 - 8.1 g/dL   Albumin 3.3 (L) 3.5 - 5.0 g/dL   AST 24 15 - 41 U/L   ALT 13 (L) 14 - 54 U/L   Alkaline Phosphatase 111 38 - 126 U/L   Total Bilirubin 0.5 0.3 - 1.2 mg/dL   GFR calc non Af Amer >60 >60 mL/min   GFR calc Af Amer >60 >60 mL/min   Anion gap 9 5 - 15  Lipase, blood     Status: None   Collection Time: 05/09/15 11:04 PM  Result Value Ref Range   Lipase 44 22 - 51 U/L  I-Stat CG4 Lactic Acid, ED     Status: None   Collection Time: 05/09/15 11:09 PM  Result Value Ref Range   Lactic Acid, Venous 1.04 0.5 - 2.0 mmol/L  Urinalysis, Routine w reflex microscopic (not at [facility])     Status: Abnormal   Collection Time: 05/10/15  2:30 AM  Result Value Ref Range   Color, Urine YELLOW YELLOW   APPearance CLOUDY (A) CLEAR   Specific Gravity, Urine 1.009 1.005 - 1.030   pH 7.5 5.0 - 8.0   Glucose, UA NEGATIVE NEGATIVE mg/dL   Hgb urine dipstick NEGATIVE NEGATIVE   Bilirubin Urine NEGATIVE NEGATIVE   Ketones, ur NEGATIVE NEGATIVE mg/dL   Protein, ur NEGATIVE NEGATIVE mg/dL   Urobilinogen, UA 1.0 0.0 - 1.0 mg/dL   Nitrite NEGATIVE NEGATIVE   Leukocytes, UA SMALL (A) NEGATIVE  Urine microscopic-add on     Status: None   Collection Time: 05/10/15  2:30 AM  Result  Value Ref Range   Squamous Epithelial / LPF RARE RARE   WBC, UA 7-10 <3 WBC/hpf   Bacteria, UA RARE RARE    Imaging Studies: Ct Abdomen Pelvis Wo Contrast  05/10/2015   CLINICAL DATA:  Abdominal pain, onset today, greater on the left. The patient believes it may be related to her percutaneous gastrostomy.  EXAM: CT ABDOMEN AND PELVIS WITHOUT CONTRAST  TECHNIQUE: Multidetector CT imaging of the abdomen and pelvis was performed following the standard protocol without IV contrast.  COMPARISON:  04/19/2015, 04/14/2015.  FINDINGS: The percutaneous gastrostomy is satisfactorily positioned with no evidence of complication. The stomach, small bowel and colon are otherwise remarkable only for mild colonic diverticulosis. Oral contrast has reached the colon. There is no bowel obstruction. There is no extraluminal air. There is no acute inflammatory change in the abdomen or pelvis. There is no ascites.  There is moderate bile duct dilatation, unchanged. No focal liver lesion is evident. There is prior cholecystectomy.  There are unremarkable unenhanced appearances of the spleen, pancreas, adrenals and kidneys.  There is no evidence of recurrent abscess.  There is prior hysterectomy.  No adnexal abnormalities are evident.   IMPRESSION: 1. Satisfactorily positioned percutaneous gastrostomy. 2. Unchanged moderate intrahepatic and extrahepatic bile duct dilatation. 3. No recurrent abscess or drainable fluid collection. 4. No acute findings are evident in the abdomen or pelvis. 5. Diverticulosis.   Electronically Signed   By: Ellery Plunk M.D.   On: 05/10/2015 03:01    Medical screening examination/treatment/procedure(s) were conducted as a shared visit with non-physician practitioner(s) and myself.  I personally evaluated the patient during the encounter.  3:39 AM Abdomen soft, nondistended. Patient and family member advised of unremarkable CT scan. Patient reportedly has pain medication at her nursing home.    Paula Libra, MD 05/10/15 [phone]

## 2015-05-10 NOTE — ED Provider Notes (Signed)
CSN: [id number]     Arrival date & time 05/09/15  2121 History   First MD Initiated Contact with Patient 05/09/15 2245     Chief Complaint  Patient presents with  . Abdominal Pain     (Consider location/radiation/quality/duration/timing/severity/associated sxs/prior Treatment) HPI Patient presents to the emergency department with abdominal pain that started yesterday.  The patient states that she has had abdominal pain the top and on for the last few months she has had a previous history of bowel obstruction.  Patient states that she had some constipation this week, but was given a laxity of at the nursing facility.  She states that she had several bowel movements following the administration of the laxity. patient denies chest pain, shortness of breath, weakness, dizziness, headache, blurred vision, back pain, neck pain, fever or syncope.  The patient states that she has not had any vomiting or diarrhea . Past Medical History  Diagnosis Date  . CAD (coronary artery disease)     s/p stenting of LAD 1999- cath 5-08 EF normal LAD 30-40% restenosis. D1 50% D2 80% LCX & RCA minimal plaque  . HTN (hypertension)   . Hyperlipemia   . Anemia     iron deficiency  . Depression   . DVT (deep venous thrombosis)   . Gout   . Osteoporosis   . Pancreatitis   . GERD (gastroesophageal reflux disease)   . Renal insufficiency     Cr 1.2-1.3  . Obesity   . Diabetes mellitus   . Polyarthritis     DJD/ possible PMR  . Vitamin D deficiency   . B12 deficiency   . Tinnitus   . Anxiety   . Aneurysm, thoracic aortic   . Constipation   . Urinary frequency   . Vertigo   . Chronic back pain    Past Surgical History  Procedure Laterality Date  . Hemorrhoid surgery    . Abdominal hysterectomy    . Cholecystectomy    . Tubal ligation    . Coronary angioplasty with stent placement  1999    LAD stent  . Cardiac catheterization  2008    L main 20%, LAD stent patent, D1 50%, D2 80% (small), RCA 20%, EF  55-60%  . Laparoscopy N/A 03/24/2015    Procedure: LAPAROSCOPY DIAGNOSTIC;  Surgeon: Luretha Murphy, MD;  Location: [facility];  Service: General;  Laterality: N/A;  . Laparoscopic lysis of adhesions N/A 03/24/2015    Procedure: LAPAROSCOPIC LYSIS OF ADHESIONS;  Surgeon: Luretha Murphy, MD;  Location: [facility];  Service: General;  Laterality: N/A;  . Laparotomy N/A 03/24/2015    Procedure: LAPAROTOMY with decompression of bowel;  Surgeon: Luretha Murphy, MD;  Location: [facility];  Service: General;  Laterality: N/A;   Family History  Problem Relation Age of Onset  . Adopted: Yes  . Diabetes Mother   . Hypertension Father    History  Substance Use Topics  . Smoking status: Never Smoker   . Smokeless tobacco: Not on file  . Alcohol Use: No   OB History    No data available     Review of Systems  All other systems negative except as documented in the HPI. All pertinent positives and negatives as reviewed in the HPI.  Allergies  Amlodipine besylate; Atenolol; Benazepril; Benicar; Cozaar; Hydrochlorothiazide w-triamterene; Hydrocodone; Hydroxyzine pamoate; Iodine; Lisinopril; Penicillins; Pravastatin; Prednisolone; and Tramadol hcl  Home Medications   Prior to Admission medications   Medication Sig Start Date End Date Taking? Authorizing Provider  acetaminophen (TYLENOL) 650 MG CR tablet Take 1,300 mg by mouth every 8 (eight) hours as needed for pain.    Yes Historical Provider, MD  alum & mag hydroxide-simeth (MAALOX/MYLANTA) 200-200-20 MG/5ML suspension Take 30 mLs by mouth every 6 (six) hours as needed for indigestion or heartburn (or bloating). 04/19/15  Yes Nishant Dhungel, MD  aspirin 325 MG tablet Take 325 mg by mouth daily.   Yes Historical Provider, MD  Cholecalciferol (VITAMIN D) 2000 UNITS CAPS Take 2,000 Units by mouth daily.    Yes Historical Provider, MD  furosemide (LASIX) 20 MG tablet Take 1 tablet (20 mg total) by mouth daily as needed for edema. Patient taking differently: Take  20 mg by mouth daily.  03/10/15 03/09/16 Yes Aleksei Plotnikov V, MD  hydrALAZINE (APRESOLINE) 25 MG tablet Take 1 tablet (25 mg total) by mouth every 8 (eight) hours. 04/19/15  Yes Nishant Dhungel, MD  lip balm (CARMEX) ointment Apply 1 application topically 2 (two) times daily. 04/19/15  Yes Nishant Dhungel, MD  metFORMIN (GLUCOPHAGE) 500 MG tablet TAKE 1 TABLET TWICE DAILY  WITH  A  MEAL 08/23/14  Yes Aleksei Plotnikov V, MD  Nutritional Supplements (FEEDING SUPPLEMENT, JEVITY 1.2 CAL,) LIQD Place 1,000 mLs into feeding tube continuous. 04/19/15  Yes Nishant Dhungel, MD  oxybutynin (DITROPAN) 5 MG tablet Take 1 tablet (5 mg total) by mouth 2 (two) times daily. 11/25/14  Yes Aleksei Plotnikov V, MD  oxyCODONE (OXY IR/ROXICODONE) 5 MG immediate release tablet Take 0.5 tablets (2.5 mg total) by mouth every 6 (six) hours as needed for moderate pain, severe pain or breakthrough pain. 04/19/15  Yes Nishant Dhungel, MD  pantoprazole (PROTONIX) 40 MG tablet Take 1 tablet (40 mg total) by mouth daily. 04/19/15  Yes Nishant Dhungel, MD  Polyethyl Glycol-Propyl Glycol (SYSTANE OP) Apply 1-2 drops to eye daily as needed (dry eyes).   Yes Historical Provider, MD  sucralfate (CARAFATE) 1 GM/10ML suspension Take 10 mLs (1 g total) by mouth 4 (four) times daily -  with meals and at bedtime. 04/19/15  Yes Nishant Dhungel, MD  vitamin B-12 (CYANOCOBALAMIN) 1000 MCG tablet Take 1,000 mcg by mouth daily.   Yes Historical Provider, MD   BP 114/55 mmHg  Pulse 107  Temp(Src) 98.8 F (37.1 C) (Oral)  Resp 18  SpO2 96% Physical Exam  Constitutional: She is oriented to person, place, and time. She appears well-developed and well-nourished. No distress.  HENT:  Head: Normocephalic and atraumatic.  Mouth/Throat: Oropharynx is clear and moist.  Eyes: Pupils are equal, round, and reactive to light.  Neck: Normal range of motion. Neck supple.  Cardiovascular: Normal rate, regular rhythm and normal heart sounds.  Exam reveals no  gallop and no friction rub.   No murmur heard. Pulmonary/Chest: Effort normal and breath sounds normal.  Abdominal: Soft. Bowel sounds are normal. She exhibits no distension. There is tenderness. There is no rebound and no guarding.  Neurological: She is alert and oriented to person, place, and time. She exhibits normal muscle tone. Coordination normal.  Skin: Skin is warm and dry. No rash noted. No erythema.  Nursing note and vitals reviewed.   ED Course  Procedures (including critical care time) Labs Review Labs Reviewed  CBC WITH DIFFERENTIAL/PLATELET - Abnormal; Notable for the following:    RBC 3.69 (*)    Hemoglobin 9.8 (*)    HCT 31.3 (*)    RDW 19.0 (*)    Platelets 413 (*)    All other components within normal  limits  COMPREHENSIVE METABOLIC PANEL - Abnormal; Notable for the following:    Sodium 131 (*)    Chloride 98 (*)    BUN 22 (*)    Albumin 3.3 (*)    ALT 13 (*)    All other components within normal limits  LIPASE, BLOOD  URINALYSIS, ROUTINE W REFLEX MICROSCOPIC (NOT AT [facility])  I-STAT CG4 LACTIC ACID, ED    The patient will be left with the oncoming nurse practitioner to follow-up with her laboratory testing and CT scan findings.  Patient has been rechecked 4.  She is feeling better following the morphine     Charlestine Night, PA-C 05/13/15 [phone]

## 2015-05-10 NOTE — ED Notes (Signed)
drsg changed to PEG tube.

## 2015-05-10 NOTE — ED Notes (Signed)
[facility] called to transport pt back to [name] 

## 2015-05-11 LAB — URINE CULTURE

## 2015-06-01 ENCOUNTER — Non-Acute Institutional Stay: Payer: Commercial Managed Care - HMO | Admitting: Adult Health

## 2015-06-01 ENCOUNTER — Encounter: Payer: Self-pay | Admitting: Adult Health

## 2015-06-01 DIAGNOSIS — K5669 Other intestinal obstruction: Secondary | ICD-10-CM | POA: Diagnosis not present

## 2015-06-01 DIAGNOSIS — IMO0002 Reserved for concepts with insufficient information to code with codable children: Secondary | ICD-10-CM

## 2015-06-01 DIAGNOSIS — K651 Peritoneal abscess: Secondary | ICD-10-CM | POA: Diagnosis not present

## 2015-06-01 DIAGNOSIS — K56609 Unspecified intestinal obstruction, unspecified as to partial versus complete obstruction: Secondary | ICD-10-CM

## 2015-06-01 DIAGNOSIS — D638 Anemia in other chronic diseases classified elsewhere: Secondary | ICD-10-CM

## 2015-06-01 NOTE — Progress Notes (Signed)
Patient ID: [name], female   DOB: [birth year], 81 y.o.   MRN: [id number]  Facility: [facility]       Allergies  Allergen Reactions  . Amlodipine Besylate Other (See Comments)     dizzy  . Atenolol Other (See Comments)    Fatigue  . Benazepril Cough  . Benicar [Olmesartan Medoxomil] Other (See Comments)    HEADACHE  . Cozaar     nausea  . Hydrochlorothiazide W-Triamterene Other (See Comments)     dizzy  . Hydrocodone Other (See Comments)    HEADACHE  . Hydroxyzine Pamoate Other (See Comments)    Per MAR  . Iodine Other (See Comments)    Per MAR  . Lisinopril Other (See Comments) and Cough    Tired & fatigue  . Penicillins Itching    tolerates cephalosporins OK  . Pravastatin Other (See Comments)    Myalgias-muscle pain  . Prednisolone Nausea Only and Other (See Comments)    Upset stomach  . Tramadol Hcl Other (See Comments)    headache    Chief Complaint  Patient presents with  . Discharge Note    Discharged from SNF    HPI:    Past Medical History  Diagnosis Date  . CAD (coronary artery disease)     s/p stenting of LAD 1999- cath 5-08 EF normal LAD 30-40% restenosis. D1 50% D2 80% LCX & RCA minimal plaque  . HTN (hypertension)   . Hyperlipemia   . Anemia     iron deficiency  . Depression   . DVT (deep venous thrombosis)   . Gout   . Osteoporosis   . Pancreatitis   . GERD (gastroesophageal reflux disease)   . Renal insufficiency     Cr 1.2-1.3  . Obesity   . Diabetes mellitus   . Polyarthritis     DJD/ possible PMR  . Vitamin D deficiency   . B12 deficiency   . Tinnitus   . Anxiety   . Aneurysm, thoracic aortic   . Constipation   . Urinary frequency   . Vertigo   . Chronic back pain     Past Surgical History  Procedure Laterality Date  . Hemorrhoid surgery    . Abdominal hysterectomy    . Cholecystectomy    . Tubal ligation    . Coronary angioplasty with stent placement  1999    LAD stent  . Cardiac  catheterization  2008    L main 20%, LAD stent patent, D1 50%, D2 80% (small), RCA 20%, EF 55-60%  . Laparoscopy N/A 03/24/2015    Procedure: LAPAROSCOPY DIAGNOSTIC;  Surgeon: Luretha Murphy, MD;  Location: [facility];  Service: General;  Laterality: N/A;  . Laparoscopic lysis of adhesions N/A 03/24/2015    Procedure: LAPAROSCOPIC LYSIS OF ADHESIONS;  Surgeon: Luretha Murphy, MD;  Location: [facility];  Service: General;  Laterality: N/A;  . Laparotomy N/A 03/24/2015    Procedure: LAPAROTOMY with decompression of bowel;  Surgeon: Luretha Murphy, MD;  Location: [facility];  Service: General;  Laterality: N/A;    VITAL SIGNS Ht 5\' 4"  (1.626 m)  Patient's Medications  New Prescriptions   No medications on file  Previous Medications   ACETAMINOPHEN (TYLENOL) 650 MG CR TABLET    Take 1,300 mg by mouth every 8 (eight) hours as needed for pain.    ALUM & MAG HYDROXIDE-SIMETH (MAALOX/MYLANTA) 200-200-20 MG/5ML SUSPENSION    Take 30 mLs by mouth every 6 (six) hours as needed  for indigestion or heartburn (or bloating).   ASPIRIN 325 MG TABLET    Take 325 mg by mouth daily.   CHOLECALCIFEROL (VITAMIN D) 2000 UNITS CAPS    Take 2,000 Units by mouth daily.    DOCUSATE SODIUM (COLACE) 100 MG CAPSULE    Take 100 mg by mouth 2 (two) times daily.   FUROSEMIDE (LASIX) 20 MG TABLET    Take 1 tablet (20 mg total) by mouth daily as needed for edema.   HYDRALAZINE (APRESOLINE) 50 MG TABLET    Take 50 mg by mouth every 8 (eight) hours. Hold for SBP (110   LIP BALM (CARMEX) OINTMENT    Apply 1 application topically 2 (two) times daily.   METFORMIN (GLUCOPHAGE) 500 MG TABLET    TAKE 1 TABLET TWICE DAILY  WITH  A  MEAL   MULTIPLE VITAMINS-MINERALS (DECUBI-VITE PO)    Take by mouth daily.   ONDANSETRON (ZOFRAN) 4 MG TABLET    Take 4 mg by mouth 3 (three) times daily.   OXYBUTYNIN (DITROPAN) 5 MG TABLET    Take 1 tablet (5 mg total) by mouth 2 (two) times daily.   OXYCODONE (OXY IR/ROXICODONE) 5 MG IMMEDIATE RELEASE TABLET     Take 0.5 tablets (2.5 mg total) by mouth every 6 (six) hours as needed for moderate pain, severe pain or breakthrough pain.   PANTOPRAZOLE (PROTONIX) 40 MG TABLET    Take 1 tablet (40 mg total) by mouth daily.   POLYETHYL GLYCOL-PROPYL GLYCOL (SYSTANE OP)    Apply 1-2 drops to eye daily as needed (dry eyes).   POLYETHYLENE GLYCOL (MIRALAX / GLYCOLAX) PACKET    Take 17 g by mouth daily.   SUCRALFATE (CARAFATE) 1 GM/10ML SUSPENSION    Take 10 mLs (1 g total) by mouth 4 (four) times daily -  with meals and at bedtime.   VITAMIN B-12 (CYANOCOBALAMIN) 1000 MCG TABLET    Take 1,000 mcg by mouth daily.  Modified Medications   No medications on file  Discontinued Medications   HYDRALAZINE (APRESOLINE) 25 MG TABLET    Take 1 tablet (25 mg total) by mouth every 8 (eight) hours.   NUTRITIONAL SUPPLEMENTS (FEEDING SUPPLEMENT, JEVITY 1.2 CAL,) LIQD    Place 1,000 mLs into feeding tube continuous.     SIGNIFICANT DIAGNOSTIC EXAMS    Review of Systems    Physical Exam     ASSESSMENT/ PLAN:    Synthia Innocent NP [facility]  Contact [phone] Monday through Friday 8am- 5pm  After hours call [phone]

## 2015-06-01 NOTE — Progress Notes (Signed)
Patient ID: [name], female   DOB: [birth year], 81 y.o.   MRN: [id number]   Facility: [facility]       Allergies  Allergen Reactions  . Amlodipine Besylate Other (See Comments)     dizzy  . Atenolol Other (See Comments)    Fatigue  . Benazepril Cough  . Benicar [Olmesartan Medoxomil] Other (See Comments)    HEADACHE  . Cozaar     nausea  . Hydrochlorothiazide W-Triamterene Other (See Comments)     dizzy  . Hydrocodone Other (See Comments)    HEADACHE  . Hydroxyzine Pamoate Other (See Comments)    Per MAR  . Iodine Other (See Comments)    Per MAR  . Lisinopril Other (See Comments) and Cough    Tired & fatigue  . Penicillins Itching    tolerates cephalosporins OK  . Pravastatin Other (See Comments)    Myalgias-muscle pain  . Prednisolone Nausea Only and Other (See Comments)    Upset stomach  . Tramadol Hcl Other (See Comments)    headache    Chief Complaint  Patient presents with  . Discharge Note    Discharged from SNF    HPI:  She has been admitted to this facility after being hospitalized for small bowel obstruction; sepsis secondary to peritonitis and an abdominal abscess. She was admitted to this facility for short term rehab.   She is being discharged to home with home health for pt/ot/nursing. She will need a bed side commode; and tube feeding supplies. She will need her prescriptions written and will need to follow up with her pcp   Past Medical History  Diagnosis Date  . CAD (coronary artery disease)     s/p stenting of LAD 1999- cath 5-08 EF normal LAD 30-40% restenosis. D1 50% D2 80% LCX & RCA minimal plaque  . HTN (hypertension)   . Hyperlipemia   . Anemia     iron deficiency  . Depression   . DVT (deep venous thrombosis)   . Gout   . Osteoporosis   . Pancreatitis   . GERD (gastroesophageal reflux disease)   . Renal insufficiency     Cr 1.2-1.3  . Obesity   . Diabetes mellitus   . Polyarthritis     DJD/ possible PMR  . Vitamin D  deficiency   . B12 deficiency   . Tinnitus   . Anxiety   . Aneurysm, thoracic aortic   . Constipation   . Urinary frequency   . Vertigo   . Chronic back pain     Past Surgical History  Procedure Laterality Date  . Hemorrhoid surgery    . Abdominal hysterectomy    . Cholecystectomy    . Tubal ligation    . Coronary angioplasty with stent placement  1999    LAD stent  . Cardiac catheterization  2008    L main 20%, LAD stent patent, D1 50%, D2 80% (small), RCA 20%, EF 55-60%  . Laparoscopy N/A 03/24/2015    Procedure: LAPAROSCOPY DIAGNOSTIC;  Surgeon: Luretha Murphy, MD;  Location: [facility];  Service: General;  Laterality: N/A;  . Laparoscopic lysis of adhesions N/A 03/24/2015    Procedure: LAPAROSCOPIC LYSIS OF ADHESIONS;  Surgeon: Luretha Murphy, MD;  Location: [facility];  Service: General;  Laterality: N/A;  . Laparotomy N/A 03/24/2015    Procedure: LAPAROTOMY with decompression of bowel;  Surgeon: Luretha Murphy, MD;  Location: [facility];  Service: General;  Laterality: N/A;    VITAL SIGNS BP  156/70 mmHg  Pulse 92  Temp(Src) 98.9 F (37.2 C) (Oral)  Resp 20  Ht 5\' 4"  (1.626 m)  Wt 140 lb (63.504 kg)  BMI 24.02 kg/m2  SpO2 98%  Patient's Medications  New Prescriptions   No medications on file  Previous Medications   ACETAMINOPHEN (TYLENOL) 650 MG CR TABLET    Take 1,300 mg by mouth every 8 (eight) hours as needed for pain.    ALUM & MAG HYDROXIDE-SIMETH (MAALOX/MYLANTA) 200-200-20 MG/5ML SUSPENSION    Take 30 mLs by mouth every 6 (six) hours as needed for indigestion or heartburn (or bloating).   ASPIRIN 325 MG TABLET    Take 325 mg by mouth daily.   CHOLECALCIFEROL (VITAMIN D) 2000 UNITS CAPS    Take 2,000 Units by mouth daily.    DOCUSATE SODIUM (COLACE) 100 MG CAPSULE    Take 100 mg by mouth 2 (two) times daily.   FUROSEMIDE (LASIX) 20 MG TABLET    Take 1 tablet (20 mg total) by mouth daily as needed for edema.   HYDRALAZINE (APRESOLINE) 50 MG TABLET    Take 50 mg by mouth  every 8 (eight) hours. Hold for SBP (110   LIP BALM (CARMEX) OINTMENT    Apply 1 application topically 2 (two) times daily.   METFORMIN (GLUCOPHAGE) 500 MG TABLET    TAKE 1 TABLET TWICE DAILY  WITH  A  MEAL   MULTIPLE VITAMINS-MINERALS (DECUBI-VITE PO)    Take by mouth daily.   ONDANSETRON (ZOFRAN) 4 MG TABLET    Take 4 mg by mouth 3 (three) times daily.   OXYBUTYNIN (DITROPAN) 5 MG TABLET    Take 1 tablet (5 mg total) by mouth 2 (two) times daily.   OXYCODONE (OXY IR/ROXICODONE) 5 MG IMMEDIATE RELEASE TABLET    Take 0.5 tablets (2.5 mg total) by mouth every 6 (six) hours as needed for moderate pain, severe pain or breakthrough pain.   PANTOPRAZOLE (PROTONIX) 40 MG TABLET    Take 1 tablet (40 mg total) by mouth daily.   POLYETHYL GLYCOL-PROPYL GLYCOL (SYSTANE OP)    Apply 1-2 drops to eye daily as needed (dry eyes).   POLYETHYLENE GLYCOL (MIRALAX / GLYCOLAX) PACKET    Take 17 g by mouth daily.   SUCRALFATE (CARAFATE) 1 GM/10ML SUSPENSION    Take 10 mLs (1 g total) by mouth 4 (four) times daily -  with meals and at bedtime.   VITAMIN B-12 (CYANOCOBALAMIN) 1000 MCG TABLET    Take 1,000 mcg by mouth daily.  Modified Medications   No medications on file  Discontinued Medications   HYDRALAZINE (APRESOLINE) 25 MG TABLET    Take 1 tablet (25 mg total) by mouth every 8 (eight) hours.     SIGNIFICANT DIAGNOSTIC EXAMS   LABS REVIEWED:   04-25-15: glucose 77; bun 36; creat 0.52; k+ 4.6; na++ 134     Review of Systems  Constitutional: Negative for appetite change and fatigue.  HENT: Negative for congestion.   Respiratory: Negative for cough, chest tightness and shortness of breath.   Cardiovascular: Negative for chest pain, palpitations and leg swelling.  Gastrointestinal: Negative for nausea, abdominal pain, diarrhea and constipation.  Musculoskeletal: Negative for myalgias and arthralgias.  Skin: Negative for pallor.  Neurological: Negative for dizziness.  Psychiatric/Behavioral: The  patient is not nervous/anxious.       Physical Exam  Constitutional: She is oriented to person, place, and time. No distress.  Eyes: Conjunctivae are normal.  Neck: Neck supple. No JVD  present. No thyromegaly present.  Cardiovascular: Normal rate, regular rhythm and intact distal pulses.   Respiratory: Effort normal and breath sounds normal. No respiratory distress. She has no wheezes.  GI: Soft. Bowel sounds are normal. She exhibits no distension. There is no tenderness.  Has peg tube   Musculoskeletal: She exhibits no edema.  Able to move all extremities   Lymphadenopathy:    She has no cervical adenopathy.  Neurological: She is alert and oriented to person, place, and time.  Skin: Skin is warm and dry. She is not diaphoretic.  Psychiatric: She has a normal mood and affect.       ASSESSMENT/ PLAN:  Will discharge her to home with home health for pt/ot/rn to evaluate and treat as indicated for endurance and adl retraining; and medication management.she will need a bedside commode and will need tube feeding supplies.  Her prescriptions have been written for a 30 day supply of her medications with #10 oxycodone 5 mg tabs. The facility has been informed and agreed  to setup a follow up appointment for her with her pcp   Time spent with patient  45   minutes >50% time spent counseling; reviewing medical record; tests; labs; and developing future plan of care   Synthia Innocent NP [facility]  Contact [phone] Monday through Friday 8am- 5pm  After hours call [phone]

## 2015-06-03 DIAGNOSIS — E119 Type 2 diabetes mellitus without complications: Secondary | ICD-10-CM | POA: Diagnosis not present

## 2015-06-03 DIAGNOSIS — Z431 Encounter for attention to gastrostomy: Secondary | ICD-10-CM | POA: Diagnosis not present

## 2015-06-03 DIAGNOSIS — F419 Anxiety disorder, unspecified: Secondary | ICD-10-CM | POA: Diagnosis not present

## 2015-06-03 DIAGNOSIS — Z48815 Encounter for surgical aftercare following surgery on the digestive system: Secondary | ICD-10-CM | POA: Diagnosis not present

## 2015-06-03 DIAGNOSIS — M6281 Muscle weakness (generalized): Secondary | ICD-10-CM | POA: Diagnosis not present

## 2015-06-03 DIAGNOSIS — F329 Major depressive disorder, single episode, unspecified: Secondary | ICD-10-CM | POA: Diagnosis not present

## 2015-06-03 DIAGNOSIS — I10 Essential (primary) hypertension: Secondary | ICD-10-CM | POA: Diagnosis not present

## 2015-06-03 DIAGNOSIS — E782 Mixed hyperlipidemia: Secondary | ICD-10-CM | POA: Diagnosis not present

## 2015-06-03 DIAGNOSIS — K5669 Other intestinal obstruction: Secondary | ICD-10-CM | POA: Diagnosis not present

## 2015-06-07 ENCOUNTER — Telehealth: Payer: Self-pay | Admitting: *Deleted

## 2015-06-07 DIAGNOSIS — Z431 Encounter for attention to gastrostomy: Secondary | ICD-10-CM | POA: Diagnosis not present

## 2015-06-07 DIAGNOSIS — I10 Essential (primary) hypertension: Secondary | ICD-10-CM | POA: Diagnosis not present

## 2015-06-07 DIAGNOSIS — F329 Major depressive disorder, single episode, unspecified: Secondary | ICD-10-CM | POA: Diagnosis not present

## 2015-06-07 DIAGNOSIS — M6281 Muscle weakness (generalized): Secondary | ICD-10-CM | POA: Diagnosis not present

## 2015-06-07 DIAGNOSIS — F419 Anxiety disorder, unspecified: Secondary | ICD-10-CM | POA: Diagnosis not present

## 2015-06-07 DIAGNOSIS — E782 Mixed hyperlipidemia: Secondary | ICD-10-CM | POA: Diagnosis not present

## 2015-06-07 DIAGNOSIS — K5669 Other intestinal obstruction: Secondary | ICD-10-CM | POA: Diagnosis not present

## 2015-06-07 DIAGNOSIS — Z48815 Encounter for surgical aftercare following surgery on the digestive system: Secondary | ICD-10-CM | POA: Diagnosis not present

## 2015-06-07 DIAGNOSIS — E119 Type 2 diabetes mellitus without complications: Secondary | ICD-10-CM | POA: Diagnosis not present

## 2015-06-07 NOTE — Telephone Encounter (Signed)
[name], PT is calling requesting verbal order for PT 2 x weekly x 8 weeks. Verbal order given.

## 2015-06-10 ENCOUNTER — Telehealth: Payer: Self-pay | Admitting: Internal Medicine

## 2015-06-10 NOTE — Telephone Encounter (Signed)
Home health therapy - both OT and PT did not see her this week because of pending auth with insurance. They should start by next week once they get approval.

## 2015-06-15 NOTE — Telephone Encounter (Signed)
Pt called today upset stating she has not had any home health services because we have not given authorization to do so. I called Tatiana @ [facility]. She states nothing further is needed from [place]. They are waiting on authorization From [employer]. She states she is going to follow up with [facility] and contact the pt and inform her of what is going on.

## 2015-06-21 DIAGNOSIS — F329 Major depressive disorder, single episode, unspecified: Secondary | ICD-10-CM | POA: Diagnosis not present

## 2015-06-21 DIAGNOSIS — I10 Essential (primary) hypertension: Secondary | ICD-10-CM | POA: Diagnosis not present

## 2015-06-21 DIAGNOSIS — F419 Anxiety disorder, unspecified: Secondary | ICD-10-CM | POA: Diagnosis not present

## 2015-06-21 DIAGNOSIS — M6281 Muscle weakness (generalized): Secondary | ICD-10-CM | POA: Diagnosis not present

## 2015-06-21 DIAGNOSIS — K5669 Other intestinal obstruction: Secondary | ICD-10-CM | POA: Diagnosis not present

## 2015-06-21 DIAGNOSIS — Z431 Encounter for attention to gastrostomy: Secondary | ICD-10-CM | POA: Diagnosis not present

## 2015-06-21 DIAGNOSIS — Z48815 Encounter for surgical aftercare following surgery on the digestive system: Secondary | ICD-10-CM | POA: Diagnosis not present

## 2015-06-21 DIAGNOSIS — E782 Mixed hyperlipidemia: Secondary | ICD-10-CM | POA: Diagnosis not present

## 2015-06-21 DIAGNOSIS — E119 Type 2 diabetes mellitus without complications: Secondary | ICD-10-CM | POA: Diagnosis not present

## 2015-06-22 ENCOUNTER — Ambulatory Visit (INDEPENDENT_AMBULATORY_CARE_PROVIDER_SITE_OTHER): Payer: Commercial Managed Care - HMO | Admitting: Internal Medicine

## 2015-06-22 ENCOUNTER — Encounter: Payer: Self-pay | Admitting: Internal Medicine

## 2015-06-22 VITALS — BP 188/94 | HR 79 | Wt 141.0 lb

## 2015-06-22 DIAGNOSIS — I509 Heart failure, unspecified: Secondary | ICD-10-CM

## 2015-06-22 DIAGNOSIS — I129 Hypertensive chronic kidney disease with stage 1 through stage 4 chronic kidney disease, or unspecified chronic kidney disease: Secondary | ICD-10-CM | POA: Diagnosis not present

## 2015-06-22 DIAGNOSIS — I130 Hypertensive heart and chronic kidney disease with heart failure and stage 1 through stage 4 chronic kidney disease, or unspecified chronic kidney disease: Secondary | ICD-10-CM

## 2015-06-22 DIAGNOSIS — N19 Unspecified kidney failure: Secondary | ICD-10-CM

## 2015-06-22 DIAGNOSIS — E538 Deficiency of other specified B group vitamins: Secondary | ICD-10-CM | POA: Diagnosis not present

## 2015-06-22 DIAGNOSIS — I2510 Atherosclerotic heart disease of native coronary artery without angina pectoris: Secondary | ICD-10-CM | POA: Diagnosis not present

## 2015-06-22 DIAGNOSIS — N259 Disorder resulting from impaired renal tubular function, unspecified: Secondary | ICD-10-CM

## 2015-06-22 DIAGNOSIS — K56609 Unspecified intestinal obstruction, unspecified as to partial versus complete obstruction: Secondary | ICD-10-CM

## 2015-06-22 DIAGNOSIS — K5669 Other intestinal obstruction: Secondary | ICD-10-CM | POA: Diagnosis not present

## 2015-06-22 MED ORDER — GLUCOSE BLOOD VI STRP: ORAL_STRIP | Status: DC

## 2015-06-22 NOTE — Assessment & Plan Note (Signed)
On Hydralazine °

## 2015-06-22 NOTE — Assessment & Plan Note (Signed)
ASA

## 2015-06-22 NOTE — Assessment & Plan Note (Signed)
On B12 

## 2015-06-22 NOTE — Progress Notes (Signed)
Pre visit review using our clinic review tool, if applicable. No additional management support is needed unless otherwise documented below in the visit note. 

## 2015-06-22 NOTE — Assessment & Plan Note (Signed)
CRF Labs

## 2015-06-22 NOTE — Progress Notes (Signed)
Subjective:  Patient ID: [name], female    DOB: [birth year]  Age: 81 y.o. MRN: [id number]  CC: No chief complaint on file.   HPI BREIANA STRATMANN presents for abd abscess f/u. F/u CFS, PMR, CAD. Pt came home from a NH 3 wks ago  Hx:  "Admit date: 03/18/2015 Discharge date: 04/20/2015   Discharge Diagnoses:  Principal Problem:  Small bowel obstruction s/p exlap/LOA/decompression 03/25/2015  Severe sepsis secondary to peritonitis and abdominal abscess   Active Problems:  Severe protein calorie malnutrition  Abdominal abscess  Diabetes type 2, controlled  Anxiety state  Adjustment disorder with mixed anxiety and depressed mood  Coronary atherosclerosis  GERD  Abdominal pain  Polymyalgia rheumatica  Chronic fatigue disorder  Knee pain, bilateral  Elevated blood pressure  Facial droop  AKI (acute kidney injury)  Pressure ulcer"   Outpatient Prescriptions Prior to Visit  Medication Sig Dispense Refill  . acetaminophen (TYLENOL) 650 MG CR tablet Take 1,300 mg by mouth every 8 (eight) hours as needed for pain.     [name] aspirin 325 MG tablet Take 325 mg by mouth daily.    . Cholecalciferol (VITAMIN D) 2000 UNITS CAPS Take 2,000 Units by mouth daily.     . hydrALAZINE (APRESOLINE) 50 MG tablet Take 50 mg by mouth every 8 (eight) hours. Hold for SBP (110    . lip balm (CARMEX) ointment Apply 1 application topically 2 (two) times daily. 7 g 0  . metFORMIN (GLUCOPHAGE) 500 MG tablet TAKE 1 TABLET TWICE DAILY  WITH  A  MEAL 180 tablet 3  . Multiple Vitamins-Minerals (DECUBI-VITE PO) Take by mouth daily.    [name] oxybutynin (DITROPAN) 5 MG tablet Take 1 tablet (5 mg total) by mouth 2 (two) times daily. 180 tablet 3  . pantoprazole (PROTONIX) 40 MG tablet Take 1 tablet (40 mg total) by mouth daily. 30 tablet 0  . vitamin B-12 (CYANOCOBALAMIN) 1000 MCG tablet Take 1,000 mcg by mouth daily.    [name] alum & mag hydroxide-simeth (MAALOX/MYLANTA) 200-200-20 MG/5ML suspension  Take 30 mLs by mouth every 6 (six) hours as needed for indigestion or heartburn (or bloating). (Patient not taking: Reported on 06/22/2015) 355 mL 0  . docusate sodium (COLACE) 100 MG capsule Take 100 mg by mouth 2 (two) times daily.    . furosemide (LASIX) 20 MG tablet Take 1 tablet (20 mg total) by mouth daily as needed for edema. (Patient not taking: Reported on 06/22/2015) 30 tablet 0  . ondansetron (ZOFRAN) 4 MG tablet Take 4 mg by mouth 3 (three) times daily.    [name] oxyCODONE (OXY IR/ROXICODONE) 5 MG immediate release tablet Take 0.5 tablets (2.5 mg total) by mouth every 6 (six) hours as needed for moderate pain, severe pain or breakthrough pain. (Patient not taking: Reported on 06/22/2015) 30 tablet 0  . Polyethyl Glycol-Propyl Glycol (SYSTANE OP) Apply 1-2 drops to eye daily as needed (dry eyes).    . polyethylene glycol (MIRALAX / GLYCOLAX) packet Take 17 g by mouth daily.    . sucralfate (CARAFATE) 1 GM/10ML suspension Take 10 mLs (1 g total) by mouth 4 (four) times daily -  with meals and at bedtime. (Patient not taking: Reported on 06/22/2015) 420 mL 0   No facility-administered medications prior to visit.    ROS Review of Systems  Constitutional: Positive for unexpected weight change. Negative for chills, activity change, appetite change and fatigue.  HENT: Negative for congestion, mouth sores and sinus pressure.   Eyes:  Negative for visual disturbance.  Respiratory: Negative for cough and chest tightness.   Gastrointestinal: Negative for nausea and abdominal pain.  Genitourinary: Negative for frequency, difficulty urinating and vaginal pain.  Musculoskeletal: Positive for back pain, arthralgias, gait problem, neck pain and neck stiffness.  Skin: Negative for pallor and rash.  Neurological: Positive for weakness and light-headedness. Negative for dizziness, tremors, numbness and headaches.  Psychiatric/Behavioral: Positive for decreased concentration. Negative for suicidal ideas,  confusion and sleep disturbance. The patient is nervous/anxious.     Objective:  BP 188/94 mmHg  Pulse 79  Wt 141 lb (63.957 kg)  SpO2 99%  BP Readings from Last 3 Encounters:  06/22/15 188/94  06/01/15 156/70  05/10/15 104/48    Wt Readings from Last 3 Encounters:  06/22/15 141 lb (63.957 kg)  06/01/15 140 lb (63.504 kg)  04/22/15 139 lb 12.8 oz (63.413 kg)    Physical Exam  Constitutional: She appears well-developed. No distress.  HENT:  Head: Normocephalic.  Right Ear: External ear normal.  Left Ear: External ear normal.  Nose: Nose normal.  Mouth/Throat: Oropharynx is clear and moist.  Eyes: Conjunctivae are normal. Pupils are equal, round, and reactive to light. Right eye exhibits no discharge. Left eye exhibits no discharge.  Neck: Normal range of motion. Neck supple. No JVD present. No tracheal deviation present. No thyromegaly present.  Cardiovascular: Normal rate, regular rhythm and normal heart sounds.   Pulmonary/Chest: No stridor. No respiratory distress. She has no wheezes.  Abdominal: Soft. Bowel sounds are normal. She exhibits no distension and no mass. There is no tenderness. There is no rebound and no guarding.  Musculoskeletal: She exhibits tenderness. She exhibits no edema.  Lymphadenopathy:    She has no cervical adenopathy.  Neurological: She displays normal reflexes. No cranial nerve deficit. She exhibits normal muscle tone. Coordination abnormal.  Skin: No rash noted. No erythema.  Psychiatric: She has a normal mood and affect. Her behavior is normal. Judgment and thought content normal.  Using a walker  Lab Results  Component Value Date   WBC 9.4 05/09/2015   HGB 9.8* 05/09/2015   HCT 31.3* 05/09/2015   PLT 413* 05/09/2015   GLUCOSE 97 05/09/2015   CHOL 99 03/28/2015   TRIG 88 04/11/2015   HDL <10* 03/28/2015   LDLDIRECT 114.8 01/10/2011   LDLCALC NOT CALCULATED 03/28/2015   ALT 13* 05/09/2015   AST 24 05/09/2015   NA 131* 05/09/2015    K 3.8 05/09/2015   CL 98* 05/09/2015   CREATININE 0.74 05/09/2015   BUN 22* 05/09/2015   CO2 24 05/09/2015   TSH 1.74 07/22/2014   INR 1.22 04/03/2015   HGBA1C 6.3* 03/28/2015    Ct Abdomen Pelvis Wo Contrast  05/10/2015   CLINICAL DATA:  Abdominal pain, onset today, greater on the left. The patient believes it may be related to her percutaneous gastrostomy.  EXAM: CT ABDOMEN AND PELVIS WITHOUT CONTRAST  TECHNIQUE: Multidetector CT imaging of the abdomen and pelvis was performed following the standard protocol without IV contrast.  COMPARISON:  04/19/2015, 04/14/2015.  FINDINGS: The percutaneous gastrostomy is satisfactorily positioned with no evidence of complication. The stomach, small bowel and colon are otherwise remarkable only for mild colonic diverticulosis. Oral contrast has reached the colon. There is no bowel obstruction. There is no extraluminal air. There is no acute inflammatory change in the abdomen or pelvis. There is no ascites.  There is moderate bile duct dilatation, unchanged. No focal liver lesion is evident. There is prior cholecystectomy.  There are unremarkable unenhanced appearances of the spleen, pancreas, adrenals and kidneys.  There is no evidence of recurrent abscess.  There is prior hysterectomy.  No adnexal abnormalities are evident.  IMPRESSION: 1. Satisfactorily positioned percutaneous gastrostomy. 2. Unchanged moderate intrahepatic and extrahepatic bile duct dilatation. 3. No recurrent abscess or drainable fluid collection. 4. No acute findings are evident in the abdomen or pelvis. 5. Diverticulosis.   Electronically Signed   By: Ellery Plunk M.D.   On: 05/10/2015 03:01    Assessment & Plan:   Diagnoses and all orders for this visit:  B12 deficiency  Small bowel obstruction s/p exlap/LOA/decompression 03/25/2015  Atherosclerosis of native coronary artery of native heart without angina pectoris  Malignant hypertension with renal failure and congestive heart  failure  Disorder resulting from impaired renal function  Other orders -     glucose blood (ONETOUCH VERIO) test strip; Use as instructed  I am having Ms. Kleinman start on glucose blood. I am also having her maintain her acetaminophen, aspirin, metFORMIN, oxybutynin, furosemide, Polyethyl Glycol-Propyl Glycol (SYSTANE OP), alum & mag hydroxide-simeth, lip balm, oxyCODONE, pantoprazole, sucralfate, Vitamin D, vitamin B-12, Multiple Vitamins-Minerals (DECUBI-VITE PO), polyethylene glycol, docusate sodium, hydrALAZINE, and ondansetron.  Meds ordered this encounter  Medications  . glucose blood (ONETOUCH VERIO) test strip    Sig: Use as instructed    Dispense:  50 each    Refill:  11     Follow-up: Return in about 2 months (around 08/22/2015) for a follow-up visit.  [name], MD

## 2015-06-22 NOTE — Assessment & Plan Note (Signed)
Small bowel obstruction s/p exlap/LOA/decompression 03/25/2015  Severe sepsis secondary to peritonitis and abdominal abscess   Doing well

## 2015-06-23 ENCOUNTER — Telehealth: Payer: Self-pay | Admitting: Internal Medicine

## 2015-06-23 DIAGNOSIS — E119 Type 2 diabetes mellitus without complications: Secondary | ICD-10-CM | POA: Diagnosis not present

## 2015-06-23 DIAGNOSIS — E782 Mixed hyperlipidemia: Secondary | ICD-10-CM | POA: Diagnosis not present

## 2015-06-23 DIAGNOSIS — Z48815 Encounter for surgical aftercare following surgery on the digestive system: Secondary | ICD-10-CM | POA: Diagnosis not present

## 2015-06-23 DIAGNOSIS — F419 Anxiety disorder, unspecified: Secondary | ICD-10-CM | POA: Diagnosis not present

## 2015-06-23 DIAGNOSIS — K5669 Other intestinal obstruction: Secondary | ICD-10-CM | POA: Diagnosis not present

## 2015-06-23 DIAGNOSIS — M6281 Muscle weakness (generalized): Secondary | ICD-10-CM | POA: Diagnosis not present

## 2015-06-23 DIAGNOSIS — I10 Essential (primary) hypertension: Secondary | ICD-10-CM | POA: Diagnosis not present

## 2015-06-23 DIAGNOSIS — Z431 Encounter for attention to gastrostomy: Secondary | ICD-10-CM | POA: Diagnosis not present

## 2015-06-23 DIAGNOSIS — F329 Major depressive disorder, single episode, unspecified: Secondary | ICD-10-CM | POA: Diagnosis not present

## 2015-06-23 NOTE — Telephone Encounter (Signed)
Verbal order given for glucometer/CBG teaching to [facility] with [facility].

## 2015-06-23 NOTE — Telephone Encounter (Signed)
Pt received a glucometer last week. They don't know how to use She is wanting an ok for nurse to go out one time for education.

## 2015-06-24 ENCOUNTER — Encounter (HOSPITAL_COMMUNITY): Payer: Self-pay

## 2015-06-24 ENCOUNTER — Emergency Department (HOSPITAL_COMMUNITY): Payer: Commercial Managed Care - HMO

## 2015-06-24 ENCOUNTER — Observation Stay (HOSPITAL_COMMUNITY)
Admission: EM | Admit: 2015-06-24 | Discharge: 2015-06-25 | Disposition: A | Discharge status: Home / Self Care | Payer: Commercial Managed Care - HMO | Attending: Internal Medicine | Admitting: Internal Medicine

## 2015-06-24 ENCOUNTER — Observation Stay (HOSPITAL_COMMUNITY): Payer: Commercial Managed Care - HMO

## 2015-06-24 DIAGNOSIS — F329 Major depressive disorder, single episode, unspecified: Secondary | ICD-10-CM | POA: Insufficient documentation

## 2015-06-24 DIAGNOSIS — Z8673 Personal history of transient ischemic attack (TIA), and cerebral infarction without residual deficits: Secondary | ICD-10-CM | POA: Insufficient documentation

## 2015-06-24 DIAGNOSIS — Z90710 Acquired absence of both cervix and uterus: Secondary | ICD-10-CM | POA: Diagnosis not present

## 2015-06-24 DIAGNOSIS — M549 Dorsalgia, unspecified: Secondary | ICD-10-CM | POA: Diagnosis not present

## 2015-06-24 DIAGNOSIS — M6281 Muscle weakness (generalized): Secondary | ICD-10-CM | POA: Diagnosis not present

## 2015-06-24 DIAGNOSIS — Z955 Presence of coronary angioplasty implant and graft: Secondary | ICD-10-CM | POA: Diagnosis not present

## 2015-06-24 DIAGNOSIS — E785 Hyperlipidemia, unspecified: Secondary | ICD-10-CM | POA: Diagnosis not present

## 2015-06-24 DIAGNOSIS — Z885 Allergy status to narcotic agent status: Secondary | ICD-10-CM | POA: Insufficient documentation

## 2015-06-24 DIAGNOSIS — Z91041 Radiographic dye allergy status: Secondary | ICD-10-CM | POA: Diagnosis not present

## 2015-06-24 DIAGNOSIS — E1121 Type 2 diabetes mellitus with diabetic nephropathy: Secondary | ICD-10-CM

## 2015-06-24 DIAGNOSIS — Z888 Allergy status to other drugs, medicaments and biological substances status: Secondary | ICD-10-CM | POA: Diagnosis not present

## 2015-06-24 DIAGNOSIS — Z880 Allergy status to penicillin: Secondary | ICD-10-CM | POA: Diagnosis not present

## 2015-06-24 DIAGNOSIS — E669 Obesity, unspecified: Secondary | ICD-10-CM | POA: Insufficient documentation

## 2015-06-24 DIAGNOSIS — G9340 Encephalopathy, unspecified: Secondary | ICD-10-CM | POA: Diagnosis not present

## 2015-06-24 DIAGNOSIS — F419 Anxiety disorder, unspecified: Secondary | ICD-10-CM | POA: Insufficient documentation

## 2015-06-24 DIAGNOSIS — R110 Nausea: Secondary | ICD-10-CM | POA: Diagnosis not present

## 2015-06-24 DIAGNOSIS — K219 Gastro-esophageal reflux disease without esophagitis: Secondary | ICD-10-CM | POA: Insufficient documentation

## 2015-06-24 DIAGNOSIS — M130 Polyarthritis, unspecified: Secondary | ICD-10-CM | POA: Diagnosis not present

## 2015-06-24 DIAGNOSIS — G459 Transient cerebral ischemic attack, unspecified: Secondary | ICD-10-CM | POA: Diagnosis not present

## 2015-06-24 DIAGNOSIS — I2510 Atherosclerotic heart disease of native coronary artery without angina pectoris: Secondary | ICD-10-CM | POA: Diagnosis not present

## 2015-06-24 DIAGNOSIS — R030 Elevated blood-pressure reading, without diagnosis of hypertension: Secondary | ICD-10-CM

## 2015-06-24 DIAGNOSIS — I10 Essential (primary) hypertension: Secondary | ICD-10-CM | POA: Insufficient documentation

## 2015-06-24 DIAGNOSIS — M109 Gout, unspecified: Secondary | ICD-10-CM | POA: Diagnosis not present

## 2015-06-24 DIAGNOSIS — G8929 Other chronic pain: Secondary | ICD-10-CM | POA: Diagnosis not present

## 2015-06-24 DIAGNOSIS — E119 Type 2 diabetes mellitus without complications: Secondary | ICD-10-CM | POA: Insufficient documentation

## 2015-06-24 DIAGNOSIS — E559 Vitamin D deficiency, unspecified: Secondary | ICD-10-CM | POA: Diagnosis not present

## 2015-06-24 DIAGNOSIS — R51 Headache: Secondary | ICD-10-CM | POA: Diagnosis not present

## 2015-06-24 DIAGNOSIS — E538 Deficiency of other specified B group vitamins: Secondary | ICD-10-CM | POA: Insufficient documentation

## 2015-06-24 DIAGNOSIS — Z7982 Long term (current) use of aspirin: Secondary | ICD-10-CM | POA: Diagnosis not present

## 2015-06-24 DIAGNOSIS — Z86718 Personal history of other venous thrombosis and embolism: Secondary | ICD-10-CM | POA: Insufficient documentation

## 2015-06-24 LAB — URINE MICROSCOPIC-ADD ON

## 2015-06-24 LAB — APTT: aPTT: 48 seconds — ABNORMAL HIGH (ref 24–37)

## 2015-06-24 LAB — CBC WITH DIFFERENTIAL/PLATELET
Basophils Absolute: 0 10*3/uL (ref 0.0–0.1)
Basophils Relative: 0 % (ref 0–1)
EOS PCT: 1 % (ref 0–5)
Eosinophils Absolute: 0 10*3/uL (ref 0.0–0.7)
HEMATOCRIT: 28.2 % — AB (ref 36.0–46.0)
Hemoglobin: 8.9 g/dL — ABNORMAL LOW (ref 12.0–15.0)
Lymphocytes Relative: 19 % (ref 12–46)
Lymphs Abs: 1.2 10*3/uL (ref 0.7–4.0)
MCH: 28.1 pg (ref 26.0–34.0)
MCHC: 31.6 g/dL (ref 30.0–36.0)
MCV: 89 fL (ref 78.0–100.0)
MONO ABS: 0.4 10*3/uL (ref 0.1–1.0)
MONOS PCT: 6 % (ref 3–12)
Neutro Abs: 4.9 10*3/uL (ref 1.7–7.7)
Neutrophils Relative %: 74 % (ref 43–77)
Platelets: 417 10*3/uL — ABNORMAL HIGH (ref 150–400)
RBC: 3.17 MIL/uL — ABNORMAL LOW (ref 3.87–5.11)
RDW: 17.7 % — AB (ref 11.5–15.5)
WBC: 6.5 10*3/uL (ref 4.0–10.5)

## 2015-06-24 LAB — COMPREHENSIVE METABOLIC PANEL
ALK PHOS: 69 U/L (ref 38–126)
ALT: 10 U/L — AB (ref 14–54)
AST: 21 U/L (ref 15–41)
Albumin: 4.2 g/dL (ref 3.5–5.0)
Anion gap: 10 (ref 5–15)
BUN: 15 mg/dL (ref 6–20)
CO2: 22 mmol/L (ref 22–32)
Calcium: 9.6 mg/dL (ref 8.9–10.3)
Chloride: 104 mmol/L (ref 101–111)
Creatinine, Ser: 0.76 mg/dL (ref 0.44–1.00)
GFR calc Af Amer: >60 mL/min (ref >60)
GFR calc non Af Amer: >60 mL/min (ref >60)
Glucose, Bld: 99 mg/dL (ref 65–99)
POTASSIUM: 4.9 mmol/L (ref 3.5–5.1)
SODIUM: 136 mmol/L (ref 135–145)
Total Bilirubin: 0.5 mg/dL (ref 0.3–1.2)
Total Protein: 8.6 g/dL — ABNORMAL HIGH (ref 6.5–8.1)

## 2015-06-24 LAB — URINALYSIS, ROUTINE W REFLEX MICROSCOPIC
Bilirubin Urine: NEGATIVE
GLUCOSE, UA: NEGATIVE mg/dL
Hgb urine dipstick: NEGATIVE
KETONES UR: NEGATIVE mg/dL
Leukocytes, UA: NEGATIVE
Nitrite: POSITIVE — AB
PROTEIN: 100 mg/dL — AB
Specific Gravity, Urine: 1.015 (ref 1.005–1.030)
UROBILINOGEN UA: 0.2 mg/dL (ref 0.0–1.0)
pH: 6 (ref 5.0–8.0)

## 2015-06-24 LAB — PROTIME-INR
INR: 1.03 (ref 0.00–1.49)
PROTHROMBIN TIME: 13.7 s (ref 11.6–15.2)

## 2015-06-24 LAB — TROPONIN I: Troponin I: <0.03 ng/mL (ref <0.031)

## 2015-06-24 MED ORDER — PANTOPRAZOLE SODIUM 40 MG PO TBEC
40.0000 mg | DELAYED_RELEASE_TABLET | Freq: Every day | ORAL | Status: DC
  Administered 2015-06-24 – 2015-06-25 (×2): 40 mg via ORAL
  Filled 2015-06-24 (×2): qty 1

## 2015-06-24 MED ORDER — ASPIRIN 325 MG PO TABS
325.0000 mg | ORAL_TABLET | Freq: Every day | ORAL | Status: DC
  Administered 2015-06-24 – 2015-06-25 (×2): 325 mg via ORAL
  Filled 2015-06-24 (×2): qty 1

## 2015-06-24 MED ORDER — HYDRALAZINE HCL 50 MG PO TABS
50.0000 mg | ORAL_TABLET | Freq: Three times a day (TID) | ORAL | Status: DC
  Administered 2015-06-24 – 2015-06-25 (×3): 50 mg via ORAL
  Filled 2015-06-24 (×3): qty 1

## 2015-06-24 MED ORDER — SODIUM CHLORIDE 0.9 % IV SOLN
INTRAVENOUS | Status: DC
  Administered 2015-06-24: 999 mL via INTRAVENOUS

## 2015-06-24 MED ORDER — STROKE: EARLY STAGES OF RECOVERY BOOK
Freq: Once | Status: AC
  Administered 2015-06-25: 19:00:00
  Filled 2015-06-24 (×2): qty 1

## 2015-06-24 MED ORDER — ENOXAPARIN SODIUM 40 MG/0.4ML ~~LOC~~ SOLN
40.0000 mg | SUBCUTANEOUS | Status: DC
  Administered 2015-06-24: 40 mg via SUBCUTANEOUS
  Filled 2015-06-24 (×2): qty 0.4

## 2015-06-24 MED ORDER — OXYBUTYNIN CHLORIDE 5 MG PO TABS
5.0000 mg | ORAL_TABLET | Freq: Two times a day (BID) | ORAL | Status: DC
  Administered 2015-06-24 – 2015-06-25 (×2): 5 mg via ORAL
  Filled 2015-06-24 (×2): qty 1

## 2015-06-24 NOTE — Care Management Note (Signed)
Case Management Note  Patient Details  Name: [name] MRN: [id number] Date of Birth: [birth year]  Subjective/Objective:               81 yr old female [name] medicare covered pt d/c from hospital in June 2016 to [facility], D/c from snf in July 2016 --family (2 females and a female) reports pt is followed by [place] [phone]   Admit dx listed as TIA    Action/Plan:  Noted Cm consult stating pt followed by Ot/Pt at home - Cm spoke with pt and family Pt in bed eating sandwich, requested water and given water by Cm  1624 sent page to attending about admission status 1626 Cm spoke with Marylu Lund at [facility] [phone] last seen 06/24/15 Has OT/PT/SW services Pt will need resumption of home health orders at d/c  1633 Admission status update noted   Expected Discharge Date:   (unknown) August 06/25/15                Expected Discharge Plan:    home with home health services   In-House Referral:   na  Discharge planning Services    home with home health services  Post Acute Care Choice:    home with home health services Choice offered to:   pt, family at bedside  DME Arranged:   na DME Agency:   na  HH Arranged:   HHPT/OT/SW  HH Agency:   [facility]   Status of Service:   completed     Additional Comments:  Ophelia Shoulder, RN 06/24/2015, 4:24 PM

## 2015-06-24 NOTE — Progress Notes (Signed)
Received from ED, swallow screen done by Ed Rn in pt's room, then  Transporter came to take the patient for MRI.

## 2015-06-24 NOTE — H&P (Signed)
[facility] History and Physical  [name] ELF:[id number] DOB: [birth year] DOA: 06/24/2015   PCP: [name], MD    Chief Complaint: Nonsensical speech  HPI: [name] is a 81 y.o. female with coronary artery disease status post stent in 1999, hypertension, hyperlipidemia. The patient lives with her husband and her daughters call multiple times a day to check on her. This morning around 1028 are called her and noticed that her mother's speech was not making any sense and she was speaking "gibberish". Another daughter called about half hour later and her mother continued to speak in the same manner. One of her daughters then went over to the house and by that time speech was improving but her mother still appears slightly confused and "weak". The episode  lasted for maybe 40 or 45 minutes. In the ER, according to the family the patient is at baseline. The patient does not recall any of this. No prior history of TIAs. The patient takes her medications out of a pillbox as set up by her family is not on any narcotics or benzodiazepine.    General: The patient denies anorexia, fever, weight loss Cardiac: Denies chest pain, syncope, palpitations, pedal edema  Respiratory: Denies cough, shortness of breath, wheezing GI: Denies severe indigestion/heartburn, abdominal pain, nausea, vomiting, diarrhea and constipation GU: Denies hematuria, incontinence, dysuria  Musculoskeletal: Denies arthritis  Skin: Denies suspicious skin lesions Neurologic: Denies focal weakness or numbness, change in vision Psychiatry: + depression or anxiety. Hematologic: no easy bruising or bleeding  All other systems reviewed and found to be negative.  Past Medical History  Diagnosis Date  . CAD (coronary artery disease)     s/p stenting of LAD 1999- cath 5-08 EF normal LAD 30-40% restenosis. D1 50% D2 80% LCX & RCA minimal plaque  . HTN (hypertension)   . Hyperlipemia   . Anemia     iron  deficiency  . Depression   . DVT (deep venous thrombosis)   . Gout   . Osteoporosis   . Pancreatitis   . GERD (gastroesophageal reflux disease)   . Renal insufficiency     Cr 1.2-1.3  . Obesity   . Diabetes mellitus   . Polyarthritis     DJD/ possible PMR  . Vitamin D deficiency   . B12 deficiency   . Tinnitus   . Anxiety   . Aneurysm, thoracic aortic   . Constipation   . Urinary frequency   . Vertigo   . Chronic back pain     Past Surgical History  Procedure Laterality Date  . Hemorrhoid surgery    . Abdominal hysterectomy    . Cholecystectomy    . Tubal ligation    . Coronary angioplasty with stent placement  1999    LAD stent  . Cardiac catheterization  2008    L main 20%, LAD stent patent, D1 50%, D2 80% (small), RCA 20%, EF 55-60%  . Laparoscopy N/A 03/24/2015    Procedure: LAPAROSCOPY DIAGNOSTIC;  Surgeon: Luretha Murphy, MD;  Location: [facility];  Service: General;  Laterality: N/A;  . Laparoscopic lysis of adhesions N/A 03/24/2015    Procedure: LAPAROSCOPIC LYSIS OF ADHESIONS;  Surgeon: Luretha Murphy, MD;  Location: [facility];  Service: General;  Laterality: N/A;  . Laparotomy N/A 03/24/2015    Procedure: LAPAROTOMY with decompression of bowel;  Surgeon: Luretha Murphy, MD;  Location: [facility];  Service: General;  Laterality: N/A;    Social History:  Does not smoke or drink  alcohol Lives at home with husband who is partially blind   Allergies  Allergen Reactions  . Amlodipine Besylate Other (See Comments)     dizzy  . Atenolol Other (See Comments)    Fatigue  . Benazepril Cough  . Benicar [Olmesartan Medoxomil] Other (See Comments)    HEADACHE  . Cozaar     nausea  . Hydrochlorothiazide W-Triamterene Other (See Comments)     dizzy  . Hydrocodone Other (See Comments)    HEADACHE  . Hydroxyzine Pamoate Other (See Comments)    Per MAR  . Iodine Other (See Comments)    Per MAR  . Lisinopril Other (See Comments) and Cough    Tired & fatigue  . Penicillins  Itching    tolerates cephalosporins OK  . Pravastatin Other (See Comments)    Myalgias-muscle pain  . Prednisolone Nausea Only and Other (See Comments)    Upset stomach  . Tramadol Hcl Other (See Comments)    headache    Family history:   Family History  Problem Relation Age of Onset  . Adopted: Yes  . Diabetes Mother   . Hypertension Father       Prior to Admission medications   Medication Sig Start Date End Date Taking? Authorizing Provider  acetaminophen (TYLENOL) 650 MG CR tablet Take 1,300 mg by mouth every 8 (eight) hours as needed for pain.    Yes Historical Provider, MD  aspirin 325 MG tablet Take 325 mg by mouth daily.   Yes Historical Provider, MD  Cholecalciferol (VITAMIN D) 2000 UNITS CAPS Take 2,000 Units by mouth daily.    Yes Historical Provider, MD  hydrALAZINE (APRESOLINE) 50 MG tablet Take 50 mg by mouth every 8 (eight) hours. Hold for SBP (110   Yes Historical Provider, MD  metFORMIN (GLUCOPHAGE) 500 MG tablet TAKE 1 TABLET TWICE DAILY  WITH  A  MEAL Patient taking differently: Take 500 mg by mouth 2 (two) times daily with a meal.  08/23/14  Yes Aleksei Plotnikov V, MD  oxybutynin (DITROPAN) 5 MG tablet Take 1 tablet (5 mg total) by mouth 2 (two) times daily. 11/25/14  Yes Aleksei Plotnikov V, MD  pantoprazole (PROTONIX) 40 MG tablet Take 1 tablet (40 mg total) by mouth daily. 04/19/15  Yes Nishant Dhungel, MD  vitamin B-12 (CYANOCOBALAMIN) 1000 MCG tablet Take 1,000 mcg by mouth daily.   Yes Historical Provider, MD  alum & mag hydroxide-simeth (MAALOX/MYLANTA) 200-200-20 MG/5ML suspension Take 30 mLs by mouth every 6 (six) hours as needed for indigestion or heartburn (or bloating). Patient not taking: Reported on 06/22/2015 04/19/15   Nishant Dhungel, MD  furosemide (LASIX) 20 MG tablet Take 1 tablet (20 mg total) by mouth daily as needed for edema. Patient not taking: Reported on 06/22/2015 03/10/15 03/09/16  Jacinta Shoe V, MD  glucose blood (ONETOUCH VERIO) test  strip Use as instructed Patient not taking: Reported on 06/24/2015 06/22/15   Tresa Garter, MD  lip balm (CARMEX) ointment Apply 1 application topically 2 (two) times daily. Patient not taking: Reported on 06/24/2015 04/19/15   Nishant Dhungel, MD  oxyCODONE (OXY IR/ROXICODONE) 5 MG immediate release tablet Take 0.5 tablets (2.5 mg total) by mouth every 6 (six) hours as needed for moderate pain, severe pain or breakthrough pain. Patient not taking: Reported on 06/22/2015 04/19/15   Nishant Dhungel, MD  sucralfate (CARAFATE) 1 GM/10ML suspension Take 10 mLs (1 g total) by mouth 4 (four) times daily -  with meals and at bedtime. Patient not  taking: Reported on 06/22/2015 04/19/15   Eddie North, MD     Physical Exam: Filed Vitals:   06/24/15 1400 06/24/15 1430 06/24/15 1500 06/24/15 1501  BP: 166/66 166/62 155/59 155/59  Pulse: 70 68 68 71  Temp:    98.3 F (36.8 C)  TempSrc:    Oral  Resp: 12 13 14 18   Height:      Weight:      SpO2: 99% 96% 98% 99%     General: Elderly female sitting up in bed in no acute distress HEENT: Normocephalic and Atraumatic, Mucous membranes pink                PERRLA; EOM intact; No scleral icterus,                 Nares: Patent, Oropharynx: Clear, Fair Dentition                 Neck: FROM, no cervical lymphadenopathy, thyromegaly, carotid bruit or JVD;  Breasts: deferred CHEST WALL: No tenderness  CHEST: Normal respiration, clear to auscultation bilaterally  HEART: Regular rate and rhythm; no murmurs rubs or gallops  BACK: No kyphosis or scoliosis; no CVA tenderness  GI: Positive Bowel Sounds, soft, non-tender; no masses, no organomegaly Rectal Exam: deferred MSK: No cyanosis, clubbing, or edema Genitalia: not examined  SKIN:  no rash or ulceration  CNS: Alert and Oriented x 4, Nonfocal exam, CN 2-12 intact  Labs on Admission:  Basic Metabolic Panel:  Recent Labs Lab 06/24/15 1230  NA 136  K 4.9  CL 104  CO2 22  GLUCOSE 99  BUN 15   CREATININE 0.76  CALCIUM 9.6   Liver Function Tests:  Recent Labs Lab 06/24/15 1230  AST 21  ALT 10*  ALKPHOS 69  BILITOT 0.5  PROT 8.6*  ALBUMIN 4.2   No results for input(s): LIPASE, AMYLASE in the last 168 hours. No results for input(s): AMMONIA in the last 168 hours. CBC:  Recent Labs Lab 06/24/15 1230  WBC 6.5  NEUTROABS 4.9  HGB 8.9*  HCT 28.2*  MCV 89.0  PLT 417*   Cardiac Enzymes:  Recent Labs Lab 06/24/15 1230  TROPONINI <0.03    BNP (last 3 results) No results for input(s): BNP in the last 8760 hours.  ProBNP (last 3 results) No results for input(s): PROBNP in the last 8760 hours.  CBG: No results for input(s): GLUCAP in the last 168 hours.  Radiological Exams on Admission: Ct Head Wo Contrast  06/24/2015   CLINICAL DATA:  Headache, nausea.  EXAM: CT HEAD WITHOUT CONTRAST  TECHNIQUE: Contiguous axial images were obtained from the base of the skull through the vertex without intravenous contrast.  COMPARISON:  CT scan of Mar 27, 2015.  FINDINGS: Bony calvarium appears intact. Minimal diffuse cortical atrophy is noted. Mild chronic ischemic white matter disease is noted. Stable old lacunar infarction in left basal ganglia. No mass effect or midline shift is noted. Ventricular size is within normal limits. There is no evidence of mass lesion, hemorrhage or acute infarction.  IMPRESSION: Minimal diffuse cortical atrophy. Mild chronic ischemic white matter disease. Stable old left basal ganglia lacunar infarction. No acute intracranial abnormality seen.   Electronically Signed   By: Mar 29, 2015, M.D.   On: 06/24/2015 12:41    EKG: Independently reviewed. Normal sinus rhythm  Assessment/Plan Principal Problem:   Acute encephalopathy -Lasting for about 40-45 minutes and resolving without any interventions -Not on psycho tropic medications or narcotics  at home - Hypoglycemic or hypotensive -Will do a TIA workup and keep for observation overnight-have  ordered an MRI stat  Active Problems:   Diabetes type 2, controlled -Hold Glucophage-Place on sliding scale insulin    Coronary atherosclerosis --Continue full dose aspirin    Elevated blood pressure -continue hydralazine    Consulted:   Code Status: full code  Family Communication: daughters at bedside  DVT Prophylaxis:Lovenox  Time spent: 55 min  Reeshemah Nazaryan, MD [facility]  If 7PM-7AM, please contact night-coverage [email or web address] 06/24/2015, 4:43 PM

## 2015-06-24 NOTE — ED Notes (Signed)
To floor at 16:05

## 2015-06-24 NOTE — ED Notes (Signed)
Pt is resting comfortably in bed with family at the bedside.

## 2015-06-24 NOTE — ED Notes (Signed)
Patieant talked to her mother on the phone at 1023 today and noticed confusion and was unable to understand what she was saying. Patient's daughter states she is back to normal except answering questions slower than usual. Patient also c/o headache.

## 2015-06-24 NOTE — ED Provider Notes (Signed)
CSN: [id number]     Arrival date & time 06/24/15  1137 History   First MD Initiated Contact with Patient 06/24/15 1159     Chief Complaint  Patient presents with  . Altered Mental Status  . Weakness     (Consider location/radiation/quality/duration/timing/severity/associated sxs/prior Treatment) HPI Comments: Patient here after having a 30 minute episode of confusion according to the daughter. Called her on the phone and states that she had trouble answering questions. Patient complains of a bitemporal headache without vomiting or fever or neck pain. No chest or abdominal pain. No peripheral weakness. Daughter states that she has returned back to her baseline pretty much. No prior history of same.  Patient is a 81 y.o. female presenting with altered mental status and weakness. The history is provided by the patient and a relative.  Altered Mental Status Associated symptoms: weakness   Weakness    Past Medical History  Diagnosis Date  . CAD (coronary artery disease)     s/p stenting of LAD 1999- cath 5-08 EF normal LAD 30-40% restenosis. D1 50% D2 80% LCX & RCA minimal plaque  . HTN (hypertension)   . Hyperlipemia   . Anemia     iron deficiency  . Depression   . DVT (deep venous thrombosis)   . Gout   . Osteoporosis   . Pancreatitis   . GERD (gastroesophageal reflux disease)   . Renal insufficiency     Cr 1.2-1.3  . Obesity   . Diabetes mellitus   . Polyarthritis     DJD/ possible PMR  . Vitamin D deficiency   . B12 deficiency   . Tinnitus   . Anxiety   . Aneurysm, thoracic aortic   . Constipation   . Urinary frequency   . Vertigo   . Chronic back pain    Past Surgical History  Procedure Laterality Date  . Hemorrhoid surgery    . Abdominal hysterectomy    . Cholecystectomy    . Tubal ligation    . Coronary angioplasty with stent placement  1999    LAD stent  . Cardiac catheterization  2008    L main 20%, LAD stent patent, D1 50%, D2 80% (small), RCA 20%, EF  55-60%  . Laparoscopy N/A 03/24/2015    Procedure: LAPAROSCOPY DIAGNOSTIC;  Surgeon: Luretha Murphy, MD;  Location: [facility];  Service: General;  Laterality: N/A;  . Laparoscopic lysis of adhesions N/A 03/24/2015    Procedure: LAPAROSCOPIC LYSIS OF ADHESIONS;  Surgeon: Luretha Murphy, MD;  Location: [facility];  Service: General;  Laterality: N/A;  . Laparotomy N/A 03/24/2015    Procedure: LAPAROTOMY with decompression of bowel;  Surgeon: Luretha Murphy, MD;  Location: [facility];  Service: General;  Laterality: N/A;   Family History  Problem Relation Age of Onset  . Adopted: Yes  . Diabetes Mother   . Hypertension Father    Social History  Substance Use Topics  . Smoking status: Never Smoker   . Smokeless tobacco: None  . Alcohol Use: No   OB History    No data available     Review of Systems  Neurological: Positive for weakness.  All other systems reviewed and are negative.     Allergies  Amlodipine besylate; Atenolol; Benazepril; Benicar; Cozaar; Hydrochlorothiazide w-triamterene; Hydrocodone; Hydroxyzine pamoate; Iodine; Lisinopril; Penicillins; Pravastatin; Prednisolone; and Tramadol hcl  Home Medications   Prior to Admission medications   Medication Sig Start Date End Date Taking? Authorizing Provider  acetaminophen (TYLENOL) 650 MG CR tablet  Take 1,300 mg by mouth every 8 (eight) hours as needed for pain.    Yes Historical Provider, MD  aspirin 325 MG tablet Take 325 mg by mouth daily.   Yes Historical Provider, MD  Cholecalciferol (VITAMIN D) 2000 UNITS CAPS Take 2,000 Units by mouth daily.    Yes Historical Provider, MD  hydrALAZINE (APRESOLINE) 50 MG tablet Take 50 mg by mouth every 8 (eight) hours. Hold for SBP (110   Yes Historical Provider, MD  metFORMIN (GLUCOPHAGE) 500 MG tablet TAKE 1 TABLET TWICE DAILY  WITH  A  MEAL Patient taking differently: Take 500 mg by mouth 2 (two) times daily with a meal.  08/23/14  Yes Aleksei Plotnikov V, MD  oxybutynin (DITROPAN) 5 MG  tablet Take 1 tablet (5 mg total) by mouth 2 (two) times daily. 11/25/14  Yes Aleksei Plotnikov V, MD  pantoprazole (PROTONIX) 40 MG tablet Take 1 tablet (40 mg total) by mouth daily. 04/19/15  Yes Nishant Dhungel, MD  vitamin B-12 (CYANOCOBALAMIN) 1000 MCG tablet Take 1,000 mcg by mouth daily.   Yes Historical Provider, MD  alum & mag hydroxide-simeth (MAALOX/MYLANTA) 200-200-20 MG/5ML suspension Take 30 mLs by mouth every 6 (six) hours as needed for indigestion or heartburn (or bloating). Patient not taking: Reported on 06/22/2015 04/19/15   Nishant Dhungel, MD  furosemide (LASIX) 20 MG tablet Take 1 tablet (20 mg total) by mouth daily as needed for edema. Patient not taking: Reported on 06/22/2015 03/10/15 03/09/16  Jacinta Shoe V, MD  glucose blood (ONETOUCH VERIO) test strip Use as instructed Patient not taking: Reported on 06/24/2015 06/22/15   Tresa Garter, MD  lip balm (CARMEX) ointment Apply 1 application topically 2 (two) times daily. Patient not taking: Reported on 06/24/2015 04/19/15   Nishant Dhungel, MD  oxyCODONE (OXY IR/ROXICODONE) 5 MG immediate release tablet Take 0.5 tablets (2.5 mg total) by mouth every 6 (six) hours as needed for moderate pain, severe pain or breakthrough pain. Patient not taking: Reported on 06/22/2015 04/19/15   Nishant Dhungel, MD  sucralfate (CARAFATE) 1 GM/10ML suspension Take 10 mLs (1 g total) by mouth 4 (four) times daily -  with meals and at bedtime. Patient not taking: Reported on 06/22/2015 04/19/15   Nishant Dhungel, MD   BP 192/86 mmHg  Pulse 91  Temp(Src) 98.5 F (36.9 C) (Oral)  Resp 16  Ht 5\' 5"  (1.651 m)  Wt 143 lb (64.864 kg)  BMI 23.80 kg/m2  SpO2 99% Physical Exam  Constitutional: She is oriented to person, place, and time. She appears well-developed and well-nourished.  Non-toxic appearance. No distress.  HENT:  Head: Normocephalic and atraumatic.  Eyes: Conjunctivae, EOM and lids are normal. Pupils are equal, round, and reactive to  light.  Neck: Normal range of motion. Neck supple. No tracheal deviation present. No thyroid mass present.  Cardiovascular: Normal rate, regular rhythm and normal heart sounds.  Exam reveals no gallop.   No murmur heard. Pulmonary/Chest: Effort normal and breath sounds normal. No stridor. No respiratory distress. She has no decreased breath sounds. She has no wheezes. She has no rhonchi. She has no rales.  Abdominal: Soft. Normal appearance and bowel sounds are normal. She exhibits no distension. There is no tenderness. There is no rebound and no CVA tenderness.  Musculoskeletal: Normal range of motion. She exhibits no edema or tenderness.  Neurological: She is alert and oriented to person, place, and time. She has normal strength. No cranial nerve deficit or sensory deficit. GCS eye subscore is  4. GCS verbal subscore is 5. GCS motor subscore is 6.  Skin: Skin is warm and dry. No abrasion and no rash noted.  Psychiatric: She has a normal mood and affect. Her speech is normal and behavior is normal.  Nursing note and vitals reviewed.   ED Course  Procedures (including critical care time) Labs Review Labs Reviewed  URINE CULTURE  PROTIME-INR  APTT  CBC WITH DIFFERENTIAL/PLATELET  COMPREHENSIVE METABOLIC PANEL  TROPONIN I  URINALYSIS, ROUTINE W REFLEX MICROSCOPIC (NOT AT [facility])    Imaging Review No results found. I, Toy Baker, personally reviewed and evaluated these images and lab results as part of my medical decision-making.   EKG Interpretation   Date/Time:  Friday June 24 2015 11:51:00 EDT Ventricular Rate:  91 PR Interval:  154 QRS Duration: 73 QT Interval:  346 QTC Calculation: 426 R Axis:   42 Text Interpretation:  Sinus rhythm Confirmed by Freida Busman  MD, Kenniel Bergsma ([id number])  on 06/24/2015 11:59:49 AM      MDM   Final diagnoses:  None    Patient will be admitted for evaluation of TIA    Lorre Nick, MD 06/24/15 1533

## 2015-06-25 ENCOUNTER — Observation Stay (HOSPITAL_BASED_OUTPATIENT_CLINIC_OR_DEPARTMENT_OTHER): Payer: Commercial Managed Care - HMO

## 2015-06-25 DIAGNOSIS — G9340 Encephalopathy, unspecified: Secondary | ICD-10-CM

## 2015-06-25 DIAGNOSIS — I10 Essential (primary) hypertension: Secondary | ICD-10-CM

## 2015-06-25 DIAGNOSIS — E785 Hyperlipidemia, unspecified: Secondary | ICD-10-CM | POA: Diagnosis not present

## 2015-06-25 DIAGNOSIS — R030 Elevated blood-pressure reading, without diagnosis of hypertension: Secondary | ICD-10-CM | POA: Diagnosis not present

## 2015-06-25 DIAGNOSIS — M109 Gout, unspecified: Secondary | ICD-10-CM | POA: Diagnosis not present

## 2015-06-25 DIAGNOSIS — F329 Major depressive disorder, single episode, unspecified: Secondary | ICD-10-CM | POA: Diagnosis not present

## 2015-06-25 DIAGNOSIS — G459 Transient cerebral ischemic attack, unspecified: Secondary | ICD-10-CM | POA: Diagnosis not present

## 2015-06-25 DIAGNOSIS — E119 Type 2 diabetes mellitus without complications: Secondary | ICD-10-CM | POA: Diagnosis not present

## 2015-06-25 DIAGNOSIS — K219 Gastro-esophageal reflux disease without esophagitis: Secondary | ICD-10-CM | POA: Diagnosis not present

## 2015-06-25 DIAGNOSIS — E559 Vitamin D deficiency, unspecified: Secondary | ICD-10-CM | POA: Diagnosis not present

## 2015-06-25 DIAGNOSIS — I2510 Atherosclerotic heart disease of native coronary artery without angina pectoris: Secondary | ICD-10-CM | POA: Diagnosis not present

## 2015-06-25 LAB — LIPID PANEL
CHOL/HDL RATIO: 3 ratio
Cholesterol: 169 mg/dL (ref 0–200)
HDL: 57 mg/dL (ref >40)
LDL Cholesterol: 100 mg/dL — ABNORMAL HIGH (ref 0–99)
Triglycerides: 61 mg/dL (ref <150)
VLDL: 12 mg/dL (ref 0–40)
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
## 2015-06-25 NOTE — Care Management Note (Addendum)
Case Management Note  Patient Details  Name: VEGA STARE MRN: [id number] Date of Birth: 12-23-1932  Subjective/Objective:         Acute encephalopathy           Action/Plan:  Home Health    Expected Discharge Date: 06/25/2015            Expected Discharge Plan:  Home w Home Health Services  In-House Referral:     Discharge planning Services  CM Consult  Post Acute Care Choice:  Home Health, Resumption of Svcs/PTA Provider Choice offered to:     DME Arranged:    DME Agency:     HH Arranged:  PT, OT, Aide, SW HH Agency:  [facility]  Status of Service:  Completed, signed off  Medicare Important Message Given:    Date Medicare IM Given:    Medicare IM give by:    Date Additional Medicare IM Given:    Additional Medicare Important Message give by:     If discussed at Long Length of Stay Meetings, dates discussed:    Additional Comments: 06/26/2015 1700 Did not receive a call back from family about switching HH agencies. Notified [facility] of dc home with resumption of care needed for [facility]. Faxed resumption of care orders to [facility], fax #[phone]. Isidoro Donning RN CCM Case Mgmt phone [phone].   NCM spoke to pt and gave permission to speak to dtr, Lurene Shadow # [phone]. Pt is active with [facility] PT, OT, SW and aide. Dtr states they prefer [facility] for [facility]. States when pt was dc from [facility], [facility] agency waited two weeks before they came out for her first visit. Dtr states she will give NCM call once they get home to confirm agency and speak to brother to see if they want to switch agencies. Pt will discharge to her son's home. Notified attending for [facility] orders, resumption of care.  Elliot Cousin, RN 06/25/2015, 6:42 PM

## 2015-06-25 NOTE — Progress Notes (Signed)
Bilateral carotid artery duplex completed:  1-39% ICA stenosis.  Vertebral artery flow is antegrade.     

## 2015-06-25 NOTE — Evaluation (Signed)
SLP Cancellation Note  Patient Details Name: [name] MRN: [id number] DOB: November 22, 1932   Cancelled treatment:       Reason Eval/Treat Not Completed: Other (comment) (order for speech evaluation received, will complete as soon as schedule allows.  thanks)   Donavan Burnet, MS [facility] SLP [phone]
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
## 2015-06-25 NOTE — Discharge Summary (Addendum)
Physician Discharge Summary  [name] YCX:[id number] DOB: 08-Nov-1933 DOA: 06/24/2015  PCP: [name], MD  Admit date: 06/24/2015 Discharge date: 06/25/2015  Time spent: 50 minutes     Discharge Condition: Stable Diet recommendation: Low sodium heart healthy diabetic  Discharge Diagnoses:  Principal Problem:   Acute encephalopathy Active Problems:   Diabetes type 2, controlled   Coronary atherosclerosis   Essential hypertension   History of present illness:  [name] is a 81 y.o. female with coronary artery disease status post stent in 1999, hypertension, hyperlipidemia. She presented to the hospital for about a 45 minute period of nonsensical speech. She was admitted to the hospital for a TIA workup. We see H and P for further details  Hospital Course:  Principal Problem:  Acute encephalopathy -Lasting for about 40-45 minutes and resolving without any interventions -Not on psycho tropic medications or narcotics at home - not Hypoglycemic or hypotensive -CT of the head revealed minimal diffuse cortical atrophy and a stable old left basal ganglia lacunar infarct -MRI negative for acute infarct-a chronic low tiny" right cerebellar infarct was seen see report below -MRA reveals minimal chronic small vessel disease -Carotid duplex negative for clinically significant stenosis -2-D echo shows no arterial thrombus, normal systolic function,grade 1 diastolic dysfunction and a moderately dilated left atrium -She can continue full dose aspirin - LDL 100 - statin not recommended after age 3 - PCP can add statin if he feels she needs it   Active Problems: 1 mm infundibulum versus aneurysm arising from the medial anterior genu of the right ICA - See MRI report below   Diabetes type 2, controlled -Can resume Glucophage   Coronary atherosclerosis --Continue full dose aspirin  Essential hypertension -continue hydralazine  Procedures: Bilateral carotid artery  duplex: 1-39% ICA stenosis. Vertebral artery flow is antegrade.   Study Conclusions  - Left ventricle: The cavity size was normal. Wall thickness was normal. Systolic function was normal. The estimated ejection fraction was in the range of 60% to 65%. Wall motion was normal; there were no regional wall motion abnormalities. Doppler parameters are consistent with abnormal left ventricular relaxation (grade 1 diastolic dysfunction). - Left atrium: The atrium was moderately dilated.  Discharge Exam: Filed Weights   06/24/15 1149  Weight: 64.864 kg (143 lb)   Filed Vitals:   06/25/15 1437  BP: 164/75  Pulse: 80  Temp: 98.7 F (37.1 C)  Resp: 18    General: AAO x 3, no distress Cardiovascular: RRR, no murmurs  Respiratory: clear to auscultation bilaterally GI: soft, non-tender, non-distended, bowel sound positive  Discharge Instructions You were cared for by a hospitalist during your hospital stay. If you have any questions about your discharge medications or the care you received while you were in the hospital after you are discharged, you can call the unit and asked to speak with the hospitalist on call if the hospitalist that took care of you is not available. Once you are discharged, your primary care physician will handle any further medical issues. Please note that NO REFILLS for any discharge medications will be authorized once you are discharged, as it is imperative that you return to your primary care physician (or establish a relationship with a primary care physician if you do not have one) for your aftercare needs so that they can reassess your need for medications and monitor your lab values.      Discharge Instructions    Diet - low sodium heart healthy  Complete by:  As directed      Increase activity slowly    Complete by:  As directed             Medication List    TAKE these medications        acetaminophen 650 MG CR tablet  Commonly known  as:  TYLENOL  Take 1,300 mg by mouth every 8 (eight) hours as needed for pain.     aspirin 325 MG tablet  Take 325 mg by mouth daily.     hydrALAZINE 50 MG tablet  Commonly known as:  APRESOLINE  Take 50 mg by mouth every 8 (eight) hours. Hold for SBP (110     metFORMIN 500 MG tablet  Commonly known as:  GLUCOPHAGE  TAKE 1 TABLET TWICE DAILY  WITH  A  MEAL     oxybutynin 5 MG tablet  Commonly known as:  DITROPAN  Take 1 tablet (5 mg total) by mouth 2 (two) times daily.     pantoprazole 40 MG tablet  Commonly known as:  PROTONIX  Take 1 tablet (40 mg total) by mouth daily.     vitamin B-12 1000 MCG tablet  Commonly known as:  CYANOCOBALAMIN  Take 1,000 mcg by mouth daily.     Vitamin D 2000 UNITS Caps  Take 2,000 Units by mouth daily.       Allergies  Allergen Reactions  . Amlodipine Besylate Other (See Comments)     dizzy  . Atenolol Other (See Comments)    Fatigue  . Benazepril Cough  . Benicar [Olmesartan Medoxomil] Other (See Comments)    HEADACHE  . Cozaar     nausea  . Hydrochlorothiazide W-Triamterene Other (See Comments)     dizzy  . Hydrocodone Other (See Comments)    HEADACHE  . Hydroxyzine Pamoate Other (See Comments)    Per MAR  . Iodine Other (See Comments)    Per MAR  . Lisinopril Other (See Comments) and Cough    Tired & fatigue  . Penicillins Itching    tolerates cephalosporins OK  . Pravastatin Other (See Comments)    Myalgias-muscle pain  . Prednisolone Nausea Only and Other (See Comments)    Upset stomach  . Tramadol Hcl Other (See Comments)    headache      The results of significant diagnostics from this hospitalization (including imaging, microbiology, ancillary and laboratory) are listed below for reference.    Significant Diagnostic Studies: Ct Head Wo Contrast  06/24/2015   CLINICAL DATA:  Headache, nausea.  EXAM: CT HEAD WITHOUT CONTRAST  TECHNIQUE: Contiguous axial images were obtained from the base of the skull through the  vertex without intravenous contrast.  COMPARISON:  CT scan of Mar 27, 2015.  FINDINGS: Bony calvarium appears intact. Minimal diffuse cortical atrophy is noted. Mild chronic ischemic white matter disease is noted. Stable old lacunar infarction in left basal ganglia. No mass effect or midline shift is noted. Ventricular size is within normal limits. There is no evidence of mass lesion, hemorrhage or acute infarction.  IMPRESSION: Minimal diffuse cortical atrophy. Mild chronic ischemic white matter disease. Stable old left basal ganglia lacunar infarction. No acute intracranial abnormality seen.   Electronically Signed   By: Lupita Raider, M.D.   On: 06/24/2015 12:41   Mr Brain Wo Contrast  06/24/2015   CLINICAL DATA:  TIA.  Decreased mentation.  Headache.  EXAM: MRI HEAD WITHOUT CONTRAST  MRA HEAD WITHOUT CONTRAST  TECHNIQUE: Multiplanar, multiecho pulse  sequences of the brain and surrounding structures were obtained without intravenous contrast. Angiographic images of the head were obtained using MRA technique without contrast.  COMPARISON:  Head CT earlier today.  Head MRI/ MRA 03/28/2015.  FINDINGS: MRI HEAD FINDINGS  Expanded, partially empty sella is unchanged from the prior MRI. There is no evidence of acute infarct, intracranial hemorrhage, mass, midline shift, or extra-axial fluid collection. There is mild cerebral atrophy. Minimal chronic small vessel ischemic disease is again seen in the cerebral white matter. Tiny, chronic right cerebellar infarct is unchanged.  Orbits are unremarkable. Paranasal sinuses and mastoid air cells are clear. Major intracranial vascular flow voids are preserved.  MRA HEAD FINDINGS  The visualized distal vertebral arteries are patent without stenosis. PICA, AICA, and SCA origins are patent. Basilar artery is patent without stenosis. PCAs are patent without proximal stenosis. Mild PCA branch vessel irregularity is again seen bilaterally with some attenuation of left MCA  branch vessels.  Internal carotid arteries are patent from skullbase to carotid termini without stenosis. There is a 1 mm medially directed bulge arising from the anterior genu on the right, unchanged. ACAs and MCAs are patent without evidence of significant stenosis. Mild MCA branch vessel irregularity is again noted bilaterally. Anterior communicating artery is patent.  IMPRESSION: 1. No acute intracranial abnormality. 2. Mild cerebral atrophy and minimal chronic small vessel ischemic disease. 3. No major intracranial arterial occlusion or significant stenosis. 4. 1 mm infundibulum versus aneurysm arising from the medial anterior genu of the right ICA.   Electronically Signed   By: Sebastian Ache   On: 06/24/2015 18:56   Mr Maxine Glenn Head/brain Wo Cm  06/24/2015   CLINICAL DATA:  TIA.  Decreased mentation.  Headache.  EXAM: MRI HEAD WITHOUT CONTRAST  MRA HEAD WITHOUT CONTRAST  TECHNIQUE: Multiplanar, multiecho pulse sequences of the brain and surrounding structures were obtained without intravenous contrast. Angiographic images of the head were obtained using MRA technique without contrast.  COMPARISON:  Head CT earlier today.  Head MRI/ MRA 03/28/2015.  FINDINGS: MRI HEAD FINDINGS  Expanded, partially empty sella is unchanged from the prior MRI. There is no evidence of acute infarct, intracranial hemorrhage, mass, midline shift, or extra-axial fluid collection. There is mild cerebral atrophy. Minimal chronic small vessel ischemic disease is again seen in the cerebral white matter. Tiny, chronic right cerebellar infarct is unchanged.  Orbits are unremarkable. Paranasal sinuses and mastoid air cells are clear. Major intracranial vascular flow voids are preserved.  MRA HEAD FINDINGS  The visualized distal vertebral arteries are patent without stenosis. PICA, AICA, and SCA origins are patent. Basilar artery is patent without stenosis. PCAs are patent without proximal stenosis. Mild PCA branch vessel irregularity is again  seen bilaterally with some attenuation of left MCA branch vessels.  Internal carotid arteries are patent from skullbase to carotid termini without stenosis. There is a 1 mm medially directed bulge arising from the anterior genu on the right, unchanged. ACAs and MCAs are patent without evidence of significant stenosis. Mild MCA branch vessel irregularity is again noted bilaterally. Anterior communicating artery is patent.  IMPRESSION: 1. No acute intracranial abnormality. 2. Mild cerebral atrophy and minimal chronic small vessel ischemic disease. 3. No major intracranial arterial occlusion or significant stenosis. 4. 1 mm infundibulum versus aneurysm arising from the medial anterior genu of the right ICA.   Electronically Signed   By: Sebastian Ache   On: 06/24/2015 18:56    Microbiology: Recent Results (from the past 240 hour(s))  Urine culture     Status: None (Preliminary result)   Collection Time: 06/24/15  1:54 PM  Result Value Ref Range Status   Specimen Description URINE, CLEAN CATCH  Final   Special Requests NONE  Final   Culture   Final    >=100,000 COLONIES/mL PSEUDOMONAS AERUGINOSA Performed at [facility]    Report Status PENDING  Incomplete     Labs: Basic Metabolic Panel:  Recent Labs Lab 06/24/15 1230  NA 136  K 4.9  CL 104  CO2 22  GLUCOSE 99  BUN 15  CREATININE 0.76  CALCIUM 9.6   Liver Function Tests:  Recent Labs Lab 06/24/15 1230  AST 21  ALT 10*  ALKPHOS 69  BILITOT 0.5  PROT 8.6*  ALBUMIN 4.2   No results for input(s): LIPASE, AMYLASE in the last 168 hours. No results for input(s): AMMONIA in the last 168 hours. CBC:  Recent Labs Lab 06/24/15 1230  WBC 6.5  NEUTROABS 4.9  HGB 8.9*  HCT 28.2*  MCV 89.0  PLT 417*   Cardiac Enzymes:  Recent Labs Lab 06/24/15 1230  TROPONINI <0.03   169    Cholesterol           Triglycerides      61    Triglycerides    HDL      57    HDL    LDL Cholesterol      100    LDL Cholesterol    VLDL       12    VLDL    Total CHOL/HDL Ratio      3.0    Total CHOL/HDL Ratio    CBC        BNP: BNP (last 3 results) No results for input(s): BNP in the last 8760 hours.  ProBNP (last 3 results) No results for input(s): PROBNP in the last 8760 hours.  CBG: No results for input(s): GLUCAP in the last 168 hours.     SignedCalvert Cantor, MD [facility] 06/25/2015, 5:37 PM

## 2015-06-25 NOTE — Progress Notes (Signed)
   06/25/15 1720  Vitals  BP (!) 161/74 mmHg  BP Location Left Arm  BP Method Automatic  Patient Position (if appropriate) Sitting  Pulse Rate 89    Discussed the above stated BP, UA results, and arthritic pain with MD Dr. Butler Denmark.  No further orders at this time.  Plan to discharge today.  Will continue to monitor.  Other vitals stable.  Barrie Lyme RN 5:44 PM 06/25/2015
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
## 2015-06-27 ENCOUNTER — Telehealth: Payer: Self-pay | Admitting: *Deleted

## 2015-06-27 DIAGNOSIS — E782 Mixed hyperlipidemia: Secondary | ICD-10-CM | POA: Diagnosis not present

## 2015-06-27 DIAGNOSIS — M6281 Muscle weakness (generalized): Secondary | ICD-10-CM | POA: Diagnosis not present

## 2015-06-27 DIAGNOSIS — K5669 Other intestinal obstruction: Secondary | ICD-10-CM | POA: Diagnosis not present

## 2015-06-27 DIAGNOSIS — F419 Anxiety disorder, unspecified: Secondary | ICD-10-CM | POA: Diagnosis not present

## 2015-06-27 DIAGNOSIS — Z48815 Encounter for surgical aftercare following surgery on the digestive system: Secondary | ICD-10-CM | POA: Diagnosis not present

## 2015-06-27 DIAGNOSIS — I10 Essential (primary) hypertension: Secondary | ICD-10-CM | POA: Diagnosis not present

## 2015-06-27 DIAGNOSIS — F329 Major depressive disorder, single episode, unspecified: Secondary | ICD-10-CM | POA: Diagnosis not present

## 2015-06-27 DIAGNOSIS — Z431 Encounter for attention to gastrostomy: Secondary | ICD-10-CM | POA: Diagnosis not present

## 2015-06-27 DIAGNOSIS — E119 Type 2 diabetes mellitus without complications: Secondary | ICD-10-CM | POA: Diagnosis not present

## 2015-06-27 LAB — URINE CULTURE

## 2015-06-27 LAB — HEMOGLOBIN A1C
HEMOGLOBIN A1C: 5.6 % (ref 4.8–5.6)
MEAN PLASMA GLUCOSE: 114 mg/dL

## 2015-06-27 NOTE — Telephone Encounter (Signed)
Tried calling pt again still no answer x's 10 rings.../lmb 

## 2015-06-27 NOTE — Telephone Encounter (Signed)
Tried calling pt concerning tcm appt no answer x's 10 rings & couldn't leave msg no vm set-up...Raechel Chute

## 2015-06-28 NOTE — Telephone Encounter (Signed)
Called pt home still no answer. Called pt daughter [name]) completed tcm call below  Transition Care Management Follow-up Telephone Call   Date discharged? 06/25/15   How have you been since you were released from the hospital? Daughter [name]) states  Mom has been staying with her brother [facility]) since she came home. She is doing ok still weak in the legs. But she will be seeing Dr. Kellie Simmering on Monday 07/04/15   Do you understand why you were in the hospital? YES   Do you understand the discharge instructions? YES   Where were you discharged to? Home   Items Reviewed:  Medications reviewed: YES  Allergies reviewed: NO  Dietary changes reviewed: NO  Referrals reviewed: YES, daughter states she currently received PT with [facility] they usually come between 3-5   Functional Questionnaire:   Activities of Daily Living (ADLs):   She states she are independent in the following: ambulation, bathing and hygiene, feeding, continence, grooming, toileting and dressing States she require assistance with the following: ambulation sometimes she is using her walker   Any transportation issues/concerns?: NO   Any patient concerns? NO   Confirmed importance and date/time of follow-up visits scheduled YES, made for 07/01/15  Provider Appointment booked with Dr. Posey Rea  Confirmed with patient if condition begins to worsen call PCP or go to the ER.  Patient was given the office number and encouraged to call back with question or concerns.  : YES

## 2015-06-29 DIAGNOSIS — Z48815 Encounter for surgical aftercare following surgery on the digestive system: Secondary | ICD-10-CM | POA: Diagnosis not present

## 2015-06-29 DIAGNOSIS — F419 Anxiety disorder, unspecified: Secondary | ICD-10-CM | POA: Diagnosis not present

## 2015-06-29 DIAGNOSIS — Z431 Encounter for attention to gastrostomy: Secondary | ICD-10-CM | POA: Diagnosis not present

## 2015-06-29 DIAGNOSIS — E119 Type 2 diabetes mellitus without complications: Secondary | ICD-10-CM | POA: Diagnosis not present

## 2015-06-29 DIAGNOSIS — M6281 Muscle weakness (generalized): Secondary | ICD-10-CM | POA: Diagnosis not present

## 2015-06-29 DIAGNOSIS — F329 Major depressive disorder, single episode, unspecified: Secondary | ICD-10-CM | POA: Diagnosis not present

## 2015-06-29 DIAGNOSIS — E782 Mixed hyperlipidemia: Secondary | ICD-10-CM | POA: Diagnosis not present

## 2015-06-29 DIAGNOSIS — I10 Essential (primary) hypertension: Secondary | ICD-10-CM | POA: Diagnosis not present

## 2015-06-29 DIAGNOSIS — K5669 Other intestinal obstruction: Secondary | ICD-10-CM | POA: Diagnosis not present

## 2015-06-30 DIAGNOSIS — I10 Essential (primary) hypertension: Secondary | ICD-10-CM | POA: Diagnosis not present

## 2015-06-30 DIAGNOSIS — E782 Mixed hyperlipidemia: Secondary | ICD-10-CM | POA: Diagnosis not present

## 2015-06-30 DIAGNOSIS — E119 Type 2 diabetes mellitus without complications: Secondary | ICD-10-CM | POA: Diagnosis not present

## 2015-06-30 DIAGNOSIS — F329 Major depressive disorder, single episode, unspecified: Secondary | ICD-10-CM | POA: Diagnosis not present

## 2015-06-30 DIAGNOSIS — M6281 Muscle weakness (generalized): Secondary | ICD-10-CM | POA: Diagnosis not present

## 2015-06-30 DIAGNOSIS — K5669 Other intestinal obstruction: Secondary | ICD-10-CM | POA: Diagnosis not present

## 2015-06-30 DIAGNOSIS — Z431 Encounter for attention to gastrostomy: Secondary | ICD-10-CM | POA: Diagnosis not present

## 2015-06-30 DIAGNOSIS — F419 Anxiety disorder, unspecified: Secondary | ICD-10-CM | POA: Diagnosis not present

## 2015-06-30 DIAGNOSIS — Z48815 Encounter for surgical aftercare following surgery on the digestive system: Secondary | ICD-10-CM | POA: Diagnosis not present

## 2015-07-01 ENCOUNTER — Ambulatory Visit (INDEPENDENT_AMBULATORY_CARE_PROVIDER_SITE_OTHER): Payer: Commercial Managed Care - HMO | Admitting: Internal Medicine

## 2015-07-01 ENCOUNTER — Telehealth: Payer: Self-pay | Admitting: *Deleted

## 2015-07-01 ENCOUNTER — Encounter: Payer: Self-pay | Admitting: Internal Medicine

## 2015-07-01 VITALS — BP 160/80 | HR 86 | Wt 142.0 lb

## 2015-07-01 DIAGNOSIS — Z431 Encounter for attention to gastrostomy: Secondary | ICD-10-CM | POA: Diagnosis not present

## 2015-07-01 DIAGNOSIS — Z48815 Encounter for surgical aftercare following surgery on the digestive system: Secondary | ICD-10-CM | POA: Diagnosis not present

## 2015-07-01 DIAGNOSIS — K5669 Other intestinal obstruction: Secondary | ICD-10-CM | POA: Diagnosis not present

## 2015-07-01 DIAGNOSIS — M353 Polymyalgia rheumatica: Secondary | ICD-10-CM | POA: Diagnosis not present

## 2015-07-01 DIAGNOSIS — G459 Transient cerebral ischemic attack, unspecified: Secondary | ICD-10-CM | POA: Insufficient documentation

## 2015-07-01 DIAGNOSIS — E538 Deficiency of other specified B group vitamins: Secondary | ICD-10-CM

## 2015-07-01 DIAGNOSIS — G9340 Encephalopathy, unspecified: Secondary | ICD-10-CM | POA: Diagnosis not present

## 2015-07-01 DIAGNOSIS — F329 Major depressive disorder, single episode, unspecified: Secondary | ICD-10-CM | POA: Diagnosis not present

## 2015-07-01 DIAGNOSIS — F419 Anxiety disorder, unspecified: Secondary | ICD-10-CM | POA: Diagnosis not present

## 2015-07-01 DIAGNOSIS — E119 Type 2 diabetes mellitus without complications: Secondary | ICD-10-CM | POA: Diagnosis not present

## 2015-07-01 DIAGNOSIS — I10 Essential (primary) hypertension: Secondary | ICD-10-CM | POA: Diagnosis not present

## 2015-07-01 DIAGNOSIS — M6281 Muscle weakness (generalized): Secondary | ICD-10-CM | POA: Diagnosis not present

## 2015-07-01 DIAGNOSIS — E782 Mixed hyperlipidemia: Secondary | ICD-10-CM | POA: Diagnosis not present

## 2015-07-01 DIAGNOSIS — R634 Abnormal weight loss: Secondary | ICD-10-CM

## 2015-07-01 NOTE — Assessment & Plan Note (Signed)
Off Rx now   

## 2015-07-01 NOTE — Progress Notes (Signed)
Subjective:  Patient ID: [name], female    DOB: 25-Feb-1933  Age: 81 y.o. MRN: [id number]  CC: No chief complaint on file.   HPI [name] presents for post-hosp f/u:  Admit date: 06/24/2015 Discharge date: 06/25/2015  Time spent: 50 minutes    Discharge Condition: Stable Diet recommendation: Low sodium heart healthy diabetic  Discharge Diagnoses:  Principal Problem:  Acute encephalopathy Active Problems:  Diabetes type 2, controlled  Coronary atherosclerosis  Essential hypertension   History of present illness:  [name] is a 81 y.o. female with coronary artery disease status post stent in 1999, hypertension, hyperlipidemia. She presented to the hospital for about a 45 minute period of nonsensical speech. She was admitted to the hospital for a TIA workup. We see H and P for further details  Hospital Course:  Principal Problem:  Acute encephalopathy -Lasting for about 40-45 minutes and resolving without any interventions -Not on psycho tropic medications or narcotics at home - not Hypoglycemic or hypotensive -CT of the head revealed minimal diffuse cortical atrophy and a stable old left basal ganglia lacunar infarct -MRI negative for acute infarct-a chronic low tiny" right cerebellar infarct was seen see report below -MRA reveals minimal chronic small vessel disease -Carotid duplex negative for clinically significant stenosis -2-D echo shows no arterial thrombus, normal systolic function,grade 1 diastolic dysfunction and a moderately dilated left atrium -She can continue full dose aspirin - LDL 100 - statin not recommended after age 82 - PCP can add statin if he feels she needs it   Active Problems: 1 mm infundibulum versus aneurysm arising from the medial anterior genu of the right ICA - See MRI report below   Diabetes type 2, controlled -Can resume Glucophage   Coronary atherosclerosis --Continue full dose aspirin  Essential  hypertension -continue hydralazine  Procedures: Bilateral carotid artery duplex: 1-39% ICA stenosis. Vertebral artery flow is antegrade.   Study Conclusions  - Left ventricle: The cavity size was normal. Wall thickness was normal. Systolic function was normal. The estimated ejection fraction was in the range of 60% to 65%. Wall motion was normal; there were no regional wall motion abnormalities. Doppler parameters are consistent with abnormal left ventricular relaxation (grade 1 diastolic dysfunction). - Left atrium: The atrium was moderately dilated.  Discharge Exam: Filed Weights   06/24/15 1149  Weight: 64.864 kg (143 lb)   Filed Vitals:   06/25/15 1437  BP: 164/75  Pulse: 80  Temp: 98.7 F (37.1 C)  Resp: 18    General: AAO x 3, no distress Cardiovascular: RRR, no murmurs  Respiratory: clear to auscultation bilaterally GI: soft, non-tender, non-distended, bowel sound positive  Discharge Instructions You were cared for by a hospitalist during your hospital stay. If you have any questions about your discharge medications or the care you received while you were in the hospital after you are discharged, you can call the unit and asked to speak with the hospitalist on call if the hospitalist that took care of you is not available. Once you are discharged, your primary care physician will handle any further medical issues. Please note that NO REFILLS for any discharge medications will be authorized once you are discharged, as it is imperative that you return to your primary care physician (or establish a relationship with a primary care physician if you do not have one) for your aftercare needs so that they can reassess your need for medications and monitor your lab values.  Discharge Instructions    Diet - low sodium heart healthy  Complete by: As directed      Increase activity slowly  Complete by: As directed                 Outpatient Prescriptions Prior to Visit  Medication Sig Dispense Refill  . acetaminophen (TYLENOL) 650 MG CR tablet Take 1,300 mg by mouth every 8 (eight) hours as needed for pain.     [name] aspirin 325 MG tablet Take 325 mg by mouth daily.    . Cholecalciferol (VITAMIN D) 2000 UNITS CAPS Take 2,000 Units by mouth daily.     . hydrALAZINE (APRESOLINE) 50 MG tablet Take 50 mg by mouth every 8 (eight) hours. Hold for SBP (110    . metFORMIN (GLUCOPHAGE) 500 MG tablet TAKE 1 TABLET TWICE DAILY  WITH  A  MEAL (Patient taking differently: Take 500 mg by mouth 2 (two) times daily with a meal. ) 180 tablet 3  . oxybutynin (DITROPAN) 5 MG tablet Take 1 tablet (5 mg total) by mouth 2 (two) times daily. 180 tablet 3  . pantoprazole (PROTONIX) 40 MG tablet Take 1 tablet (40 mg total) by mouth daily. 30 tablet 0  . vitamin B-12 (CYANOCOBALAMIN) 1000 MCG tablet Take 1,000 mcg by mouth daily.     No facility-administered medications prior to visit.    ROS Review of Systems  Constitutional: Negative for chills, activity change, appetite change, fatigue and unexpected weight change.  HENT: Negative for congestion, mouth sores and sinus pressure.   Eyes: Negative for visual disturbance.  Respiratory: Negative for cough and chest tightness.   Gastrointestinal: Negative for nausea and abdominal pain.  Genitourinary: Negative for frequency, difficulty urinating and vaginal pain.  Musculoskeletal: Negative for back pain and gait problem.  Skin: Negative for pallor and rash.  Neurological: Positive for weakness. Negative for dizziness, tremors, speech difficulty, light-headedness, numbness and headaches.  Psychiatric/Behavioral: Positive for decreased concentration. Negative for suicidal ideas, confusion and sleep disturbance. The patient is not nervous/anxious.     Objective:  BP 160/80 mmHg  Pulse 86  Wt 142 lb (64.411 kg)  SpO2 99%  BP Readings from Last 3 Encounters:  07/01/15 160/80   06/25/15 161/74  06/22/15 188/94    Wt Readings from Last 3 Encounters:  07/01/15 142 lb (64.411 kg)  06/24/15 143 lb (64.864 kg)  06/24/15 143 lb (64.864 kg)    Physical Exam  Constitutional: She appears well-developed. No distress.  HENT:  Head: Normocephalic.  Right Ear: External ear normal.  Left Ear: External ear normal.  Nose: Nose normal.  Mouth/Throat: Oropharynx is clear and moist.  Eyes: Conjunctivae are normal. Pupils are equal, round, and reactive to light. Right eye exhibits no discharge. Left eye exhibits no discharge.  Neck: Normal range of motion. Neck supple. No JVD present. No tracheal deviation present. No thyromegaly present.  Cardiovascular: Normal rate, regular rhythm and normal heart sounds.   Pulmonary/Chest: No stridor. No respiratory distress. She has no wheezes.  Abdominal: Soft. Bowel sounds are normal. She exhibits no distension and no mass. There is no tenderness. There is no rebound and no guarding.  Musculoskeletal: She exhibits no edema or tenderness.  Lymphadenopathy:    She has no cervical adenopathy.  Neurological: She displays normal reflexes. No cranial nerve deficit. She exhibits normal muscle tone. Coordination abnormal.  Skin: No rash noted. No erythema.  Psychiatric: She has a normal mood and affect. Her behavior is normal. Judgment and thought  content normal.  Using a walker No pronator drift A/o/c Recalls 2/3 Serial sevens - x1 correct  Lab Results  Component Value Date   WBC 6.5 06/24/2015   HGB 8.9* 06/24/2015   HCT 28.2* 06/24/2015   PLT 417* 06/24/2015   GLUCOSE 99 06/24/2015   CHOL 169 06/25/2015   TRIG 61 06/25/2015   HDL 57 06/25/2015   LDLDIRECT 114.8 01/10/2011   LDLCALC 100* 06/25/2015   ALT 10* 06/24/2015   AST 21 06/24/2015   NA 136 06/24/2015   K 4.9 06/24/2015   CL 104 06/24/2015   CREATININE 0.76 06/24/2015   BUN 15 06/24/2015   CO2 22 06/24/2015   TSH 1.74 07/22/2014   INR 1.03 06/24/2015   HGBA1C  5.6 06/25/2015    Ct Head Wo Contrast  06/24/2015   CLINICAL DATA:  Headache, nausea.  EXAM: CT HEAD WITHOUT CONTRAST  TECHNIQUE: Contiguous axial images were obtained from the base of the skull through the vertex without intravenous contrast.  COMPARISON:  CT scan of Mar 27, 2015.  FINDINGS: Bony calvarium appears intact. Minimal diffuse cortical atrophy is noted. Mild chronic ischemic white matter disease is noted. Stable old lacunar infarction in left basal ganglia. No mass effect or midline shift is noted. Ventricular size is within normal limits. There is no evidence of mass lesion, hemorrhage or acute infarction.  IMPRESSION: Minimal diffuse cortical atrophy. Mild chronic ischemic white matter disease. Stable old left basal ganglia lacunar infarction. No acute intracranial abnormality seen.   Electronically Signed   By: Lupita Raider, M.D.   On: 06/24/2015 12:41   Mr Brain Wo Contrast  06/24/2015   CLINICAL DATA:  TIA.  Decreased mentation.  Headache.  EXAM: MRI HEAD WITHOUT CONTRAST  MRA HEAD WITHOUT CONTRAST  TECHNIQUE: Multiplanar, multiecho pulse sequences of the brain and surrounding structures were obtained without intravenous contrast. Angiographic images of the head were obtained using MRA technique without contrast.  COMPARISON:  Head CT earlier today.  Head MRI/ MRA 03/28/2015.  FINDINGS: MRI HEAD FINDINGS  Expanded, partially empty sella is unchanged from the prior MRI. There is no evidence of acute infarct, intracranial hemorrhage, mass, midline shift, or extra-axial fluid collection. There is mild cerebral atrophy. Minimal chronic small vessel ischemic disease is again seen in the cerebral white matter. Tiny, chronic right cerebellar infarct is unchanged.  Orbits are unremarkable. Paranasal sinuses and mastoid air cells are clear. Major intracranial vascular flow voids are preserved.  MRA HEAD FINDINGS  The visualized distal vertebral arteries are patent without stenosis. PICA, AICA, and  SCA origins are patent. Basilar artery is patent without stenosis. PCAs are patent without proximal stenosis. Mild PCA branch vessel irregularity is again seen bilaterally with some attenuation of left MCA branch vessels.  Internal carotid arteries are patent from skullbase to carotid termini without stenosis. There is a 1 mm medially directed bulge arising from the anterior genu on the right, unchanged. ACAs and MCAs are patent without evidence of significant stenosis. Mild MCA branch vessel irregularity is again noted bilaterally. Anterior communicating artery is patent.  IMPRESSION: 1. No acute intracranial abnormality. 2. Mild cerebral atrophy and minimal chronic small vessel ischemic disease. 3. No major intracranial arterial occlusion or significant stenosis. 4. 1 mm infundibulum versus aneurysm arising from the medial anterior genu of the right ICA.   Electronically Signed   By: Sebastian Ache   On: 06/24/2015 18:56   Mr Maxine Glenn Head/brain Wo Cm  06/24/2015   CLINICAL DATA:  TIA.  Decreased mentation.  Headache.  EXAM: MRI HEAD WITHOUT CONTRAST  MRA HEAD WITHOUT CONTRAST  TECHNIQUE: Multiplanar, multiecho pulse sequences of the brain and surrounding structures were obtained without intravenous contrast. Angiographic images of the head were obtained using MRA technique without contrast.  COMPARISON:  Head CT earlier today.  Head MRI/ MRA 03/28/2015.  FINDINGS: MRI HEAD FINDINGS  Expanded, partially empty sella is unchanged from the prior MRI. There is no evidence of acute infarct, intracranial hemorrhage, mass, midline shift, or extra-axial fluid collection. There is mild cerebral atrophy. Minimal chronic small vessel ischemic disease is again seen in the cerebral white matter. Tiny, chronic right cerebellar infarct is unchanged.  Orbits are unremarkable. Paranasal sinuses and mastoid air cells are clear. Major intracranial vascular flow voids are preserved.  MRA HEAD FINDINGS  The visualized distal vertebral  arteries are patent without stenosis. PICA, AICA, and SCA origins are patent. Basilar artery is patent without stenosis. PCAs are patent without proximal stenosis. Mild PCA branch vessel irregularity is again seen bilaterally with some attenuation of left MCA branch vessels.  Internal carotid arteries are patent from skullbase to carotid termini without stenosis. There is a 1 mm medially directed bulge arising from the anterior genu on the right, unchanged. ACAs and MCAs are patent without evidence of significant stenosis. Mild MCA branch vessel irregularity is again noted bilaterally. Anterior communicating artery is patent.  IMPRESSION: 1. No acute intracranial abnormality. 2. Mild cerebral atrophy and minimal chronic small vessel ischemic disease. 3. No major intracranial arterial occlusion or significant stenosis. 4. 1 mm infundibulum versus aneurysm arising from the medial anterior genu of the right ICA.   Electronically Signed   By: Sebastian Ache   On: 06/24/2015 18:56    Assessment & Plan:   There are no diagnoses linked to this encounter. I am having Ms. Weckwerth maintain her acetaminophen, aspirin, metFORMIN, oxybutynin, pantoprazole, Vitamin D, vitamin B-12, hydrALAZINE, and ACCU-CHEK SMARTVIEW.  Meds ordered this encounter  Medications  . ACCU-CHEK SMARTVIEW test strip    Sig: every morning.     Follow-up: No Follow-up on file.  [name], MD

## 2015-07-01 NOTE — Assessment & Plan Note (Signed)
8/16 TIA related ASA qd

## 2015-07-01 NOTE — Assessment & Plan Note (Addendum)
8/16 ASA In PT/OT

## 2015-07-01 NOTE — Progress Notes (Signed)
Pre visit review using our clinic review tool, if applicable. No additional management support is needed unless otherwise documented below in the visit note. 

## 2015-07-01 NOTE — Assessment & Plan Note (Signed)
On Metformin 

## 2015-07-01 NOTE — Telephone Encounter (Signed)
Gave Radovan verbal orders for OT x 2 weeks. Pt was given Rx for pressure stockings at 07/01/15 OV.  I faxed Rx for tub transfer bench to [facility] @ [phone].

## 2015-07-01 NOTE — Assessment & Plan Note (Signed)
Resolved Wt Readings from Last 3 Encounters:  07/01/15 142 lb (64.411 kg)  06/24/15 143 lb (64.864 kg)  06/24/15 143 lb (64.864 kg)

## 2015-07-01 NOTE — Assessment & Plan Note (Signed)
On B12 

## 2015-07-04 DIAGNOSIS — M19042 Primary osteoarthritis, left hand: Secondary | ICD-10-CM | POA: Diagnosis not present

## 2015-07-04 DIAGNOSIS — M1711 Unilateral primary osteoarthritis, right knee: Secondary | ICD-10-CM | POA: Diagnosis not present

## 2015-07-04 DIAGNOSIS — D509 Iron deficiency anemia, unspecified: Secondary | ICD-10-CM | POA: Diagnosis not present

## 2015-07-04 DIAGNOSIS — M19041 Primary osteoarthritis, right hand: Secondary | ICD-10-CM | POA: Diagnosis not present

## 2015-07-04 DIAGNOSIS — E119 Type 2 diabetes mellitus without complications: Secondary | ICD-10-CM | POA: Diagnosis not present

## 2015-07-04 DIAGNOSIS — M1712 Unilateral primary osteoarthritis, left knee: Secondary | ICD-10-CM | POA: Diagnosis not present

## 2015-07-05 ENCOUNTER — Telehealth: Payer: Self-pay | Admitting: Internal Medicine

## 2015-07-05 NOTE — Telephone Encounter (Signed)
Please advise, thanks.

## 2015-07-05 NOTE — Telephone Encounter (Signed)
[name] is looking for verbals on PT for 2/week for 6 weeks starting from 06/30/2015.

## 2015-07-05 NOTE — Telephone Encounter (Signed)
Ok Thx 

## 2015-07-06 DIAGNOSIS — F329 Major depressive disorder, single episode, unspecified: Secondary | ICD-10-CM | POA: Diagnosis not present

## 2015-07-06 DIAGNOSIS — K5669 Other intestinal obstruction: Secondary | ICD-10-CM | POA: Diagnosis not present

## 2015-07-06 DIAGNOSIS — M6281 Muscle weakness (generalized): Secondary | ICD-10-CM | POA: Diagnosis not present

## 2015-07-06 DIAGNOSIS — E782 Mixed hyperlipidemia: Secondary | ICD-10-CM | POA: Diagnosis not present

## 2015-07-06 DIAGNOSIS — I10 Essential (primary) hypertension: Secondary | ICD-10-CM | POA: Diagnosis not present

## 2015-07-06 DIAGNOSIS — F419 Anxiety disorder, unspecified: Secondary | ICD-10-CM | POA: Diagnosis not present

## 2015-07-06 DIAGNOSIS — E119 Type 2 diabetes mellitus without complications: Secondary | ICD-10-CM | POA: Diagnosis not present

## 2015-07-06 DIAGNOSIS — Z48815 Encounter for surgical aftercare following surgery on the digestive system: Secondary | ICD-10-CM | POA: Diagnosis not present

## 2015-07-06 DIAGNOSIS — Z431 Encounter for attention to gastrostomy: Secondary | ICD-10-CM | POA: Diagnosis not present

## 2015-07-06 NOTE — Telephone Encounter (Signed)
Advised [name] with home health that verbal order is ok for PT per request

## 2015-07-06 NOTE — Telephone Encounter (Signed)
Ok Thx 

## 2015-07-08 ENCOUNTER — Other Ambulatory Visit: Payer: Self-pay | Admitting: Adult Health

## 2015-07-11 NOTE — Telephone Encounter (Signed)
What do I need to do? Thx 

## 2015-07-11 NOTE — Telephone Encounter (Signed)
Patient stated that she need therapy.excerise for lower part of her body and her insurance will not approve of it. Please advise

## 2015-07-12 NOTE — Telephone Encounter (Signed)
I called Kyla Balzarine- She states she has contacted pt's ins and is awaiting approval. The patient and her daughter were informed of this. Nothing further is needed from [place] at this time.

## 2015-07-14 DIAGNOSIS — Z48815 Encounter for surgical aftercare following surgery on the digestive system: Secondary | ICD-10-CM | POA: Diagnosis not present

## 2015-07-14 DIAGNOSIS — E782 Mixed hyperlipidemia: Secondary | ICD-10-CM | POA: Diagnosis not present

## 2015-07-14 DIAGNOSIS — K5669 Other intestinal obstruction: Secondary | ICD-10-CM | POA: Diagnosis not present

## 2015-07-14 DIAGNOSIS — E119 Type 2 diabetes mellitus without complications: Secondary | ICD-10-CM | POA: Diagnosis not present

## 2015-07-14 DIAGNOSIS — M6281 Muscle weakness (generalized): Secondary | ICD-10-CM | POA: Diagnosis not present

## 2015-07-14 DIAGNOSIS — I10 Essential (primary) hypertension: Secondary | ICD-10-CM | POA: Diagnosis not present

## 2015-07-14 DIAGNOSIS — F419 Anxiety disorder, unspecified: Secondary | ICD-10-CM | POA: Diagnosis not present

## 2015-07-14 DIAGNOSIS — F329 Major depressive disorder, single episode, unspecified: Secondary | ICD-10-CM | POA: Diagnosis not present

## 2015-07-14 DIAGNOSIS — Z431 Encounter for attention to gastrostomy: Secondary | ICD-10-CM | POA: Diagnosis not present

## 2015-07-15 DIAGNOSIS — I10 Essential (primary) hypertension: Secondary | ICD-10-CM | POA: Diagnosis not present

## 2015-07-15 DIAGNOSIS — K5669 Other intestinal obstruction: Secondary | ICD-10-CM | POA: Diagnosis not present

## 2015-07-15 DIAGNOSIS — Z48815 Encounter for surgical aftercare following surgery on the digestive system: Secondary | ICD-10-CM | POA: Diagnosis not present

## 2015-07-15 DIAGNOSIS — E119 Type 2 diabetes mellitus without complications: Secondary | ICD-10-CM | POA: Diagnosis not present

## 2015-07-15 DIAGNOSIS — M6281 Muscle weakness (generalized): Secondary | ICD-10-CM | POA: Diagnosis not present

## 2015-07-15 DIAGNOSIS — F419 Anxiety disorder, unspecified: Secondary | ICD-10-CM | POA: Diagnosis not present

## 2015-07-15 DIAGNOSIS — F329 Major depressive disorder, single episode, unspecified: Secondary | ICD-10-CM | POA: Diagnosis not present

## 2015-07-15 DIAGNOSIS — Z431 Encounter for attention to gastrostomy: Secondary | ICD-10-CM | POA: Diagnosis not present

## 2015-07-15 DIAGNOSIS — E782 Mixed hyperlipidemia: Secondary | ICD-10-CM | POA: Diagnosis not present

## 2015-07-19 ENCOUNTER — Telehealth: Payer: Self-pay | Admitting: Internal Medicine

## 2015-07-19 DIAGNOSIS — I10 Essential (primary) hypertension: Secondary | ICD-10-CM | POA: Diagnosis not present

## 2015-07-19 DIAGNOSIS — Z48815 Encounter for surgical aftercare following surgery on the digestive system: Secondary | ICD-10-CM | POA: Diagnosis not present

## 2015-07-19 DIAGNOSIS — E119 Type 2 diabetes mellitus without complications: Secondary | ICD-10-CM | POA: Diagnosis not present

## 2015-07-19 DIAGNOSIS — E782 Mixed hyperlipidemia: Secondary | ICD-10-CM | POA: Diagnosis not present

## 2015-07-19 DIAGNOSIS — M6281 Muscle weakness (generalized): Secondary | ICD-10-CM | POA: Diagnosis not present

## 2015-07-19 DIAGNOSIS — F419 Anxiety disorder, unspecified: Secondary | ICD-10-CM | POA: Diagnosis not present

## 2015-07-19 DIAGNOSIS — Z431 Encounter for attention to gastrostomy: Secondary | ICD-10-CM | POA: Diagnosis not present

## 2015-07-19 DIAGNOSIS — F329 Major depressive disorder, single episode, unspecified: Secondary | ICD-10-CM | POA: Diagnosis not present

## 2015-07-19 DIAGNOSIS — R2681 Unsteadiness on feet: Secondary | ICD-10-CM

## 2015-07-19 DIAGNOSIS — K5669 Other intestinal obstruction: Secondary | ICD-10-CM | POA: Diagnosis not present

## 2015-07-19 NOTE — Telephone Encounter (Signed)
[name] is calling to advise that the patient's insurance will not cover the tub transfer bench unless a letter of medical necessity is sent in. She asks that the letter be sent in with a new script to fax# [phone].

## 2015-07-20 NOTE — Telephone Encounter (Signed)
I will write letter. Please advise what is the medical necessity for tub transfer bench?

## 2015-07-20 NOTE — Telephone Encounter (Signed)
Unstable gait Thx

## 2015-07-21 ENCOUNTER — Telehealth: Payer: Self-pay | Admitting: Internal Medicine

## 2015-07-21 DIAGNOSIS — I10 Essential (primary) hypertension: Secondary | ICD-10-CM | POA: Diagnosis not present

## 2015-07-21 DIAGNOSIS — Z48815 Encounter for surgical aftercare following surgery on the digestive system: Secondary | ICD-10-CM | POA: Diagnosis not present

## 2015-07-21 DIAGNOSIS — M6281 Muscle weakness (generalized): Secondary | ICD-10-CM | POA: Diagnosis not present

## 2015-07-21 DIAGNOSIS — Z431 Encounter for attention to gastrostomy: Secondary | ICD-10-CM | POA: Diagnosis not present

## 2015-07-21 DIAGNOSIS — F329 Major depressive disorder, single episode, unspecified: Secondary | ICD-10-CM | POA: Diagnosis not present

## 2015-07-21 DIAGNOSIS — K5669 Other intestinal obstruction: Secondary | ICD-10-CM | POA: Diagnosis not present

## 2015-07-21 DIAGNOSIS — E119 Type 2 diabetes mellitus without complications: Secondary | ICD-10-CM | POA: Diagnosis not present

## 2015-07-21 DIAGNOSIS — F419 Anxiety disorder, unspecified: Secondary | ICD-10-CM | POA: Diagnosis not present

## 2015-07-21 DIAGNOSIS — E782 Mixed hyperlipidemia: Secondary | ICD-10-CM | POA: Diagnosis not present

## 2015-07-21 MED ORDER — PANTOPRAZOLE SODIUM 40 MG PO TBEC: 40.0000 mg | DELAYED_RELEASE_TABLET | Freq: Every day | ORAL | Status: DC

## 2015-07-21 MED ORDER — OXYBUTYNIN CHLORIDE 5 MG PO TABS: 5.0000 mg | ORAL_TABLET | Freq: Every day | ORAL | Status: DC

## 2015-07-21 MED ORDER — HYDRALAZINE HCL 50 MG PO TABS: 50.0000 mg | ORAL_TABLET | Freq: Three times a day (TID) | ORAL | Status: DC

## 2015-07-21 NOTE — Telephone Encounter (Signed)
Generated letter & order fax to # below...Raechel Chute

## 2015-07-21 NOTE — Telephone Encounter (Signed)
Tried calling pt no answer x's 10 rings, no vm, but refills has been sent to [facility]...Raechel Chute

## 2015-07-21 NOTE — Telephone Encounter (Signed)
Patient is requesting refills for: hydrALAZINE (APRESOLINE) 50 MG tablet [[id number]]  pantoprazole (PROTONIX) 40 MG tablet [[id number]] and  oxybutynin (DITROPAN) 5 MG tablet [[id number]]   She was hoping you could change her dose 1 a day instead of 2. She feels she's urinating too much  Pharmacy is [facility] on [address] She has taken her last bp medication today

## 2015-07-24 NOTE — Progress Notes (Signed)
Patient ID: [name], female   DOB: 1933-08-05, 81 y.o.   MRN: [id number]    DATE:  04/21/15 MRN:  [id number]  BIRTHDAY: 11/17/1932  Facility:  Nursing Home Location:  [facility]  Nursing Home Room Number: 403-1  LEVEL OF CARE:  SNF [phone])  Contact Information    Name Relation Home Work Mobile   [place] Daughter [phone]  [phone]   [place],Linda Daughter [phone]  [phone]   [facility] Daughter [phone]  [phone]   [place]ell,Edward Spouse [phone]     [facility] Son [phone]        Chief Complaint  Patient presents with  . Hospitalization Follow-up    Physical deconditioning, small bowel obstruction S/P exlap/LOA/decompression, stroke, protein calorie malnutrition, anemia, hypertension, GERD, diabetes mellitus type 2 and overactive bladder    HISTORY OF PRESENT ILLNESS:  This is an 81 year old female who has been admitted to [facility] on 04/20/15 from [facility]. She has PMH of hypertension, CAD, hyperlipidemia, iron deficiency anemia, depression, osteoporosis and mild chronic kidney disease. She presented to ED due to nausea and diffuse crampy lower abdominal pain, poor by mouth intake and generalized weakness. She was found to have small bowel obstruction secondary to adhesions for which she had laparoscopic lysis of adhesions/laparotomy with bowel decompression on 5/12 . On 5/13 she developed left arm weakness with CT scan showing 4 mm Lachman infarct in the left lentiform nucleus. On 5/20  repeat CT of the abdomen showed 2 large intraperitoneal fluid collections. She had percutaneous drainage with placement of abcess drain by IR on 5/23. She was started on vancomycin on 5/23 since cultures grew enterococcus. On 6/3 G-tube was placed by IR and drains removed.  She has been admitted for a short-term rehabilitation.  PAST MEDICAL HISTORY:  Past Medical History  Diagnosis Date  . CAD (coronary artery  disease)     s/p stenting of LAD 1999- cath 5-08 EF normal LAD 30-40% restenosis. D1 50% D2 80% LCX & RCA minimal plaque  . HTN (hypertension)   . Hyperlipemia   . Anemia     iron deficiency  . Depression   . DVT (deep venous thrombosis)   . Gout   . Osteoporosis   . Pancreatitis   . GERD (gastroesophageal reflux disease)   . Renal insufficiency     Cr 1.2-1.3  . Obesity   . Diabetes mellitus   . Polyarthritis     DJD/ possible PMR  . Vitamin D deficiency   . B12 deficiency   . Tinnitus   . Anxiety   . Aneurysm, thoracic aortic   . Constipation   . Urinary frequency   . Vertigo   . Chronic back pain      CURRENT MEDICATIONS: Reviewed per [facility]   Allergies  Allergen Reactions  . Amlodipine Besylate Other (See Comments)     dizzy  . Atenolol Other (See Comments)    Fatigue  . Benazepril Cough  . Benicar [Olmesartan Medoxomil] Other (See Comments)    HEADACHE  . Cozaar     nausea  . Hydrochlorothiazide W-Triamterene Other (See Comments)     dizzy  . Hydrocodone Other (See Comments)    HEADACHE  . Hydroxyzine Pamoate Other (See Comments)    Per MAR  . Iodine Other (See Comments)    Per MAR  . Lisinopril Other (See Comments) and Cough    Tired & fatigue  . Penicillins Itching    tolerates cephalosporins OK  . Pravastatin  Other (See Comments)    Myalgias-muscle pain  . Prednisolone Nausea Only and Other (See Comments)    Upset stomach  . Tramadol Hcl Other (See Comments)    headache     REVIEW OF SYSTEMS:  GENERAL: no change in appetite, no fatigue, no weight changes, no fever, chills or weakness EYES: Denies change in vision, dry eyes, eye pain, itching or discharge EARS: Denies change in hearing, ringing in ears, or earache NOSE: Denies nasal congestion or epistaxis MOUTH and THROAT: Denies oral discomfort, gingival pain or bleeding, pain from teeth or hoarseness   RESPIRATORY: no cough, SOB, DOE, wheezing, hemoptysis CARDIAC: no chest pain, or  palpitations GI: no abdominal pain, diarrhea, constipation, heart burn, nausea or vomiting GU: Denies dysuria, frequency, hematuria, incontinence, or discharge PSYCHIATRIC: Denies feeling of depression or anxiety. No report of hallucinations, insomnia, paranoia, or agitation   PHYSICAL EXAMINATION  GENERAL APPEARANCE:  In no acute distress. Normal body habitus SKIN:  Sacral pressure ulcer HEAD: Normal in size and contour. No evidence of trauma EYES: Lids open and close normally. No blepharitis, entropion or ectropion. PERRL. Conjunctivae are clear and sclerae are white. Lenses are without opacity EARS: Pinnae are normal. Patient hears normal voice tunes of the examiner MOUTH and THROAT: Lips are without lesions. Oral mucosa is moist and without lesions. Tongue is normal in shape, size, and color and without lesions NECK: supple, trachea midline, no neck masses, no thyroid tenderness, no thyromegaly LYMPHATICS: no LAN in the neck, no supraclavicular LAN RESPIRATORY: breathing is even & unlabored, BS CTAB CARDIAC: RRR, no murmur,no extra heart sounds, BLE edema 1+ GI: abdomen soft, normal BS, no masses, no tenderness, no hepatomegaly, no splenomegaly, + g-tube LUQ EXTREMITIES:  Right arm weakness PSYCHIATRIC: Alert and oriented X 3. Affect and behavior are appropriate  LABS/RADIOLOGY: Labs reviewed: Basic Metabolic Panel:  Recent Labs  86/76/72 0500 04/11/15 0447  04/14/15 0451  04/19/15 0524     NA 134* 135  < >  --   < > 136     K 4.1 3.9  < >  --   < > 5.3*     CL 106 108  < >  --   < > 102     CO2 20* 19*  < >  --   < > 24     GLUCOSE 131* 104*  < >  --   < > 142*     BUN 40* 42*  < >  --   < > 52*     CREATININE 0.71 0.67  < >  --   < > 0.79     CALCIUM 9.0 9.2  < >  --   < > 9.2     MG 1.5* 1.9  --  1.7  --   --      PHOS 4.2 4.2  --  4.8*  --   --      < > = values in this interval not displayed.  Liver Function Tests:  Recent Labs  04/18/15 0930    AST 69*      ALT 54    ALKPHOS 211*    BILITOT 0.4    PROT 7.9    ALBUMIN 2.5*      Recent Labs   04/18/15 0930 04/19/15 0524   LIPASE 156* 142*     CBC:  Recent Labs  04/11/15 0447  04/18/15 0930    WBC 8.1  < > 10.3  NEUTROABS 6.2  --   --     HGB 7.7*  < > 9.5*    HCT 24.4*  < > 29.6*    MCV 81.1  < > 83.9    PLT 618*  < > 401*    < > = values in this interval not displayed.  Lipid Panel:  Recent Labs  03/28/15 0357   HDL <10*    Cardiac Enzymes:   Recent Labs  03/25/15 2223 03/26/15 0510   TROPONINI 0.03 0.03    CBG:  Recent Labs  04/20/15 0351 04/20/15 0744 04/20/15 1224  GLUCAP 143* 135* 167*    ASSESSMENT/PLAN:  Physical deconditioning - for rehabilitation  SBO S/P exploratory laparoscopy/LOA/decompression - continue oxycodone 5 mg 1/2 -1 tab = 2.5 mg  every 6 hours when necessary and Tylenol 1300 mg every 8 hours when necessary for pain  Stroke - continue aspirin 325 mg 1 tab by mouth daily; continue Isosource 1.5 at 65 mL per hour via PEG tube; check BMP  Protein calorie malnutrition, severe - albumin 2.5; continue tube feeds and nutritional supplements  Anemia, acute blood loss - hemoglobin 9.5; check CBC  Anemia, acute blood loss - hemoglobin 9.5; check CBC  Hypertension - continue hydralazine 25 mg 1 tab every 8 hours  Diabetes mellitus type 2, controlled - continue metformin 500 mg 1 tab by mouth twice a day; check hemoglobin A1c  Overactive bladder - continue Ditropan 5 mg by mouth twice a day    Goals of care:  Short-term rehabilitation   [facility], NP [facility] [phone]

## 2015-07-25 DIAGNOSIS — Z431 Encounter for attention to gastrostomy: Secondary | ICD-10-CM | POA: Diagnosis not present

## 2015-07-25 DIAGNOSIS — E119 Type 2 diabetes mellitus without complications: Secondary | ICD-10-CM | POA: Diagnosis not present

## 2015-07-25 DIAGNOSIS — F419 Anxiety disorder, unspecified: Secondary | ICD-10-CM | POA: Diagnosis not present

## 2015-07-25 DIAGNOSIS — F329 Major depressive disorder, single episode, unspecified: Secondary | ICD-10-CM | POA: Diagnosis not present

## 2015-07-25 DIAGNOSIS — M6281 Muscle weakness (generalized): Secondary | ICD-10-CM | POA: Diagnosis not present

## 2015-07-25 DIAGNOSIS — Z48815 Encounter for surgical aftercare following surgery on the digestive system: Secondary | ICD-10-CM | POA: Diagnosis not present

## 2015-07-25 DIAGNOSIS — I10 Essential (primary) hypertension: Secondary | ICD-10-CM | POA: Diagnosis not present

## 2015-07-25 DIAGNOSIS — E782 Mixed hyperlipidemia: Secondary | ICD-10-CM | POA: Diagnosis not present

## 2015-07-25 DIAGNOSIS — K5669 Other intestinal obstruction: Secondary | ICD-10-CM | POA: Diagnosis not present

## 2015-07-29 ENCOUNTER — Telehealth: Payer: Self-pay | Admitting: Internal Medicine

## 2015-07-29 DIAGNOSIS — Z431 Encounter for attention to gastrostomy: Secondary | ICD-10-CM | POA: Diagnosis not present

## 2015-07-29 DIAGNOSIS — F329 Major depressive disorder, single episode, unspecified: Secondary | ICD-10-CM | POA: Diagnosis not present

## 2015-07-29 DIAGNOSIS — E782 Mixed hyperlipidemia: Secondary | ICD-10-CM | POA: Diagnosis not present

## 2015-07-29 DIAGNOSIS — K5669 Other intestinal obstruction: Secondary | ICD-10-CM | POA: Diagnosis not present

## 2015-07-29 DIAGNOSIS — E119 Type 2 diabetes mellitus without complications: Secondary | ICD-10-CM | POA: Diagnosis not present

## 2015-07-29 DIAGNOSIS — F419 Anxiety disorder, unspecified: Secondary | ICD-10-CM | POA: Diagnosis not present

## 2015-07-29 DIAGNOSIS — Z48815 Encounter for surgical aftercare following surgery on the digestive system: Secondary | ICD-10-CM | POA: Diagnosis not present

## 2015-07-29 DIAGNOSIS — M6281 Muscle weakness (generalized): Secondary | ICD-10-CM | POA: Diagnosis not present

## 2015-07-29 DIAGNOSIS — I10 Essential (primary) hypertension: Secondary | ICD-10-CM | POA: Diagnosis not present

## 2015-08-08 ENCOUNTER — Telehealth: Payer: Self-pay | Admitting: Internal Medicine

## 2015-08-08 DIAGNOSIS — G319 Degenerative disease of nervous system, unspecified: Secondary | ICD-10-CM | POA: Diagnosis not present

## 2015-08-08 DIAGNOSIS — I10 Essential (primary) hypertension: Secondary | ICD-10-CM | POA: Diagnosis not present

## 2015-08-08 DIAGNOSIS — Z431 Encounter for attention to gastrostomy: Secondary | ICD-10-CM | POA: Diagnosis not present

## 2015-08-08 DIAGNOSIS — K5669 Other intestinal obstruction: Secondary | ICD-10-CM | POA: Diagnosis not present

## 2015-08-08 DIAGNOSIS — I6782 Cerebral ischemia: Secondary | ICD-10-CM | POA: Diagnosis not present

## 2015-08-08 DIAGNOSIS — E782 Mixed hyperlipidemia: Secondary | ICD-10-CM | POA: Diagnosis not present

## 2015-08-08 DIAGNOSIS — I2510 Atherosclerotic heart disease of native coronary artery without angina pectoris: Secondary | ICD-10-CM | POA: Diagnosis not present

## 2015-08-08 DIAGNOSIS — Z48815 Encounter for surgical aftercare following surgery on the digestive system: Secondary | ICD-10-CM | POA: Diagnosis not present

## 2015-08-08 DIAGNOSIS — E119 Type 2 diabetes mellitus without complications: Secondary | ICD-10-CM | POA: Diagnosis not present

## 2015-08-08 NOTE — Telephone Encounter (Signed)
radovan is calling to advise that patient is being discharged from [facility] today due to the fact that Francine Graven will not cover more visits. He states that she does need more PT, but ins will not cover. He does recommend outpatient therapy in its place, as insurance may cover that.

## 2015-08-08 NOTE — Telephone Encounter (Signed)
OK if pt can go to OP PT Thx

## 2015-08-09 NOTE — Telephone Encounter (Signed)
Pt has appt 08/12/15 will discuss OP PT then with md.../lmb

## 2015-08-12 ENCOUNTER — Ambulatory Visit (INDEPENDENT_AMBULATORY_CARE_PROVIDER_SITE_OTHER): Payer: Commercial Managed Care - HMO | Admitting: Internal Medicine

## 2015-08-12 ENCOUNTER — Encounter: Payer: Self-pay | Admitting: Internal Medicine

## 2015-08-12 ENCOUNTER — Other Ambulatory Visit (INDEPENDENT_AMBULATORY_CARE_PROVIDER_SITE_OTHER): Payer: Commercial Managed Care - HMO

## 2015-08-12 VITALS — BP 139/82 | HR 98 | Wt 141.0 lb

## 2015-08-12 DIAGNOSIS — E538 Deficiency of other specified B group vitamins: Secondary | ICD-10-CM | POA: Diagnosis not present

## 2015-08-12 DIAGNOSIS — I10 Essential (primary) hypertension: Secondary | ICD-10-CM

## 2015-08-12 DIAGNOSIS — L659 Nonscarring hair loss, unspecified: Secondary | ICD-10-CM | POA: Insufficient documentation

## 2015-08-12 DIAGNOSIS — G458 Other transient cerebral ischemic attacks and related syndromes: Secondary | ICD-10-CM

## 2015-08-12 DIAGNOSIS — R634 Abnormal weight loss: Secondary | ICD-10-CM

## 2015-08-12 DIAGNOSIS — E119 Type 2 diabetes mellitus without complications: Secondary | ICD-10-CM

## 2015-08-12 LAB — CBC WITH DIFFERENTIAL/PLATELET
Basophils Absolute: 0 10*3/uL (ref 0.0–0.1)
Basophils Relative: 0.5 % (ref 0.0–3.0)
EOS ABS: 0.2 10*3/uL (ref 0.0–0.7)
EOS PCT: 3.1 % (ref 0.0–5.0)
HEMATOCRIT: 31.2 % — AB (ref 36.0–46.0)
HEMOGLOBIN: 10.2 g/dL — AB (ref 12.0–15.0)
LYMPHS PCT: 19.9 % (ref 12.0–46.0)
Lymphs Abs: 1.1 10*3/uL (ref 0.7–4.0)
MCHC: 32.7 g/dL (ref 30.0–36.0)
MCV: 91 fl (ref 78.0–100.0)
MONO ABS: 0.5 10*3/uL (ref 0.1–1.0)
Monocytes Relative: 8 % (ref 3.0–12.0)
Neutro Abs: 3.9 10*3/uL (ref 1.4–7.7)
Neutrophils Relative %: 68.5 % (ref 43.0–77.0)
Platelets: 281 10*3/uL (ref 150.0–400.0)
RBC: 3.43 Mil/uL — AB (ref 3.87–5.11)
RDW: 14.4 % (ref 11.5–15.5)
WBC: 5.7 10*3/uL (ref 4.0–10.5)

## 2015-08-12 LAB — URINALYSIS, ROUTINE W REFLEX MICROSCOPIC
BILIRUBIN URINE: NEGATIVE
HGB URINE DIPSTICK: NEGATIVE
KETONES UR: NEGATIVE
NITRITE: NEGATIVE
Specific Gravity, Urine: 1.015 (ref 1.000–1.030)
Total Protein, Urine: 30 — AB
UROBILINOGEN UA: 0.2 (ref 0.0–1.0)
Urine Glucose: NEGATIVE
pH: 6 (ref 5.0–8.0)

## 2015-08-12 LAB — BASIC METABOLIC PANEL
BUN: 28 mg/dL — AB (ref 6–23)
CO2: 25 mEq/L (ref 19–32)
Calcium: 9.9 mg/dL (ref 8.4–10.5)
Chloride: 102 mEq/L (ref 96–112)
Creatinine, Ser: 0.9 mg/dL (ref 0.40–1.20)
GFR: 77.42 mL/min (ref >60.00)
Glucose, Bld: 92 mg/dL (ref 70–99)
POTASSIUM: 4.8 meq/L (ref 3.5–5.1)
Sodium: 137 mEq/L (ref 135–145)

## 2015-08-12 LAB — GLUCOSE, POCT (MANUAL RESULT ENTRY): POC GLUCOSE: 111 mg/dL — AB (ref 70–99)

## 2015-08-12 LAB — HEPATIC FUNCTION PANEL
ALT: 8 U/L (ref 0–35)
AST: 16 U/L (ref 0–37)
Albumin: 4.1 g/dL (ref 3.5–5.2)
Alkaline Phosphatase: 71 U/L (ref 39–117)
BILIRUBIN DIRECT: 0 mg/dL (ref 0.0–0.3)
BILIRUBIN TOTAL: 0.3 mg/dL (ref 0.2–1.2)
Total Protein: 8.5 g/dL — ABNORMAL HIGH (ref 6.0–8.3)

## 2015-08-12 LAB — HEMOGLOBIN A1C: Hgb A1c MFr Bld: 5.3 % (ref 4.6–6.5)

## 2015-08-12 LAB — TSH: TSH: 1.59 u[IU]/mL (ref 0.35–4.50)

## 2015-08-12 LAB — IBC PANEL
IRON: 37 ug/dL — AB (ref 42–145)
Saturation Ratios: 13.8 % — ABNORMAL LOW (ref 20.0–50.0)
TRANSFERRIN: 191 mg/dL — AB (ref 212.0–360.0)

## 2015-08-12 MED ORDER — CARVEDILOL 3.125 MG PO TABS: 3.1250 mg | ORAL_TABLET | Freq: Two times a day (BID) | ORAL | Status: DC

## 2015-08-12 MED ORDER — ONETOUCH DELICA LANCETS FINE MISC: 1.0000 | Freq: Every day | Status: DC | PRN

## 2015-08-12 MED ORDER — GLUCOSE BLOOD VI STRP: ORAL_STRIP | Status: DC

## 2015-08-12 NOTE — Addendum Note (Signed)
Addended by: Merrilyn Puma on: 08/12/2015 04:59 PM   Modules accepted: Orders

## 2015-08-12 NOTE — Progress Notes (Signed)
Pre visit review using our clinic review tool, if applicable. No additional management support is needed unless otherwise documented below in the visit note. 

## 2015-08-12 NOTE — Assessment & Plan Note (Signed)
On B12 

## 2015-08-12 NOTE — Assessment & Plan Note (Signed)
On Metformin 

## 2015-08-12 NOTE — Assessment & Plan Note (Signed)
Rechecked BP is much better  On Hydralazine

## 2015-08-12 NOTE — Assessment & Plan Note (Signed)
Wt Readings from Last 3 Encounters:  08/12/15 141 lb (63.957 kg)  07/01/15 142 lb (64.411 kg)  06/24/15 143 lb (64.864 kg)

## 2015-08-12 NOTE — Progress Notes (Signed)
Subjective:  Patient ID: [name], female    DOB: [birth year]  Age: 81 y.o. MRN: [id number]  CC: No chief complaint on file.   HPI [name] presents for HTN, DM,OA, anemia f/up. /o hair loss on Hydralazine    Outpatient Prescriptions Prior to Visit  Medication Sig Dispense Refill  . ACCU-CHEK SMARTVIEW test strip every morning.    [name] acetaminophen (TYLENOL) 650 MG CR tablet Take 1,300 mg by mouth every 8 (eight) hours as needed for pain.     [name] aspirin 325 MG tablet Take 325 mg by mouth daily.    . Cholecalciferol (VITAMIN D) 2000 UNITS CAPS Take 2,000 Units by mouth daily.     . metFORMIN (GLUCOPHAGE) 500 MG tablet TAKE 1 TABLET TWICE DAILY  WITH  A  MEAL (Patient taking differently: Take 500 mg by mouth 2 (two) times daily with a meal. ) 180 tablet 3  . oxybutynin (DITROPAN) 5 MG tablet Take 1 tablet (5 mg total) by mouth daily. 30 tablet 5  . pantoprazole (PROTONIX) 40 MG tablet Take 1 tablet (40 mg total) by mouth daily. 30 tablet 5  . vitamin B-12 (CYANOCOBALAMIN) 1000 MCG tablet Take 1,000 mcg by mouth daily.    . hydrALAZINE (APRESOLINE) 50 MG tablet Take 1 tablet (50 mg total) by mouth every 8 (eight) hours. Hold for SBP (110 30 tablet 5   No facility-administered medications prior to visit.    ROS Review of Systems  Constitutional: Positive for fatigue. Negative for chills, activity change, appetite change and unexpected weight change.  HENT: Negative for congestion, mouth sores and sinus pressure.   Eyes: Negative for visual disturbance.  Respiratory: Negative for cough and chest tightness.   Gastrointestinal: Negative for nausea and abdominal pain.  Genitourinary: Negative for frequency, difficulty urinating and vaginal pain.  Musculoskeletal: Positive for back pain, arthralgias and neck stiffness. Negative for gait problem.  Skin: Negative for pallor and rash.  Neurological: Positive for dizziness. Negative for tremors, weakness, numbness and headaches.    Psychiatric/Behavioral: Negative for suicidal ideas, confusion and sleep disturbance.    Objective:  BP 139/82 mmHg  Pulse 98  Wt 141 lb (63.957 kg)  SpO2 99%  BP Readings from Last 3 Encounters:  08/12/15 139/82  07/01/15 160/80  06/25/15 161/74    Wt Readings from Last 3 Encounters:  08/12/15 141 lb (63.957 kg)  07/01/15 142 lb (64.411 kg)  06/24/15 143 lb (64.864 kg)    Physical Exam  Constitutional: She appears well-developed. No distress.  HENT:  Head: Normocephalic.  Right Ear: External ear normal.  Left Ear: External ear normal.  Nose: Nose normal.  Mouth/Throat: Oropharynx is clear and moist.  Eyes: Conjunctivae are normal. Pupils are equal, round, and reactive to light. Right eye exhibits no discharge. Left eye exhibits no discharge.  Neck: Normal range of motion. Neck supple. No JVD present. No tracheal deviation present. No thyromegaly present.  Cardiovascular: Normal rate, regular rhythm and normal heart sounds.   Pulmonary/Chest: No stridor. No respiratory distress. She has no wheezes.  Abdominal: Soft. Bowel sounds are normal. She exhibits no distension and no mass. There is no tenderness. There is no rebound and no guarding.  Musculoskeletal: She exhibits tenderness. She exhibits no edema.  Lymphadenopathy:    She has no cervical adenopathy.  Neurological: She displays normal reflexes. No cranial nerve deficit. She exhibits normal muscle tone. Coordination abnormal.  Skin: No rash noted. No erythema.  Psychiatric: She has a normal mood  and affect. Her behavior is normal. Judgment and thought content normal.  Obese Using a walker  Lab Results  Component Value Date   WBC 6.5 06/24/2015   HGB 8.9* 06/24/2015   HCT 28.2* 06/24/2015   PLT 417* 06/24/2015   GLUCOSE 99 06/24/2015   CHOL 169 06/25/2015   TRIG 61 06/25/2015   HDL 57 06/25/2015   LDLDIRECT 114.8 01/10/2011   LDLCALC 100* 06/25/2015   ALT 10* 06/24/2015   AST 21 06/24/2015   NA 136  06/24/2015   K 4.9 06/24/2015   CL 104 06/24/2015   CREATININE 0.76 06/24/2015   BUN 15 06/24/2015   CO2 22 06/24/2015   TSH 1.74 07/22/2014   INR 1.03 06/24/2015   HGBA1C 5.6 06/25/2015    Ct Head Wo Contrast  06/24/2015   CLINICAL DATA:  Headache, nausea.  EXAM: CT HEAD WITHOUT CONTRAST  TECHNIQUE: Contiguous axial images were obtained from the base of the skull through the vertex without intravenous contrast.  COMPARISON:  CT scan of Mar 27, 2015.  FINDINGS: Bony calvarium appears intact. Minimal diffuse cortical atrophy is noted. Mild chronic ischemic white matter disease is noted. Stable old lacunar infarction in left basal ganglia. No mass effect or midline shift is noted. Ventricular size is within normal limits. There is no evidence of mass lesion, hemorrhage or acute infarction.  IMPRESSION: Minimal diffuse cortical atrophy. Mild chronic ischemic white matter disease. Stable old left basal ganglia lacunar infarction. No acute intracranial abnormality seen.   Electronically Signed   By: Lupita Raider, M.D.   On: 06/24/2015 12:41   Mr Brain Wo Contrast  06/24/2015   CLINICAL DATA:  TIA.  Decreased mentation.  Headache.  EXAM: MRI HEAD WITHOUT CONTRAST  MRA HEAD WITHOUT CONTRAST  TECHNIQUE: Multiplanar, multiecho pulse sequences of the brain and surrounding structures were obtained without intravenous contrast. Angiographic images of the head were obtained using MRA technique without contrast.  COMPARISON:  Head CT earlier today.  Head MRI/ MRA 03/28/2015.  FINDINGS: MRI HEAD FINDINGS  Expanded, partially empty sella is unchanged from the prior MRI. There is no evidence of acute infarct, intracranial hemorrhage, mass, midline shift, or extra-axial fluid collection. There is mild cerebral atrophy. Minimal chronic small vessel ischemic disease is again seen in the cerebral white matter. Tiny, chronic right cerebellar infarct is unchanged.  Orbits are unremarkable. Paranasal sinuses and mastoid  air cells are clear. Major intracranial vascular flow voids are preserved.  MRA HEAD FINDINGS  The visualized distal vertebral arteries are patent without stenosis. PICA, AICA, and SCA origins are patent. Basilar artery is patent without stenosis. PCAs are patent without proximal stenosis. Mild PCA branch vessel irregularity is again seen bilaterally with some attenuation of left MCA branch vessels.  Internal carotid arteries are patent from skullbase to carotid termini without stenosis. There is a 1 mm medially directed bulge arising from the anterior genu on the right, unchanged. ACAs and MCAs are patent without evidence of significant stenosis. Mild MCA branch vessel irregularity is again noted bilaterally. Anterior communicating artery is patent.  IMPRESSION: 1. No acute intracranial abnormality. 2. Mild cerebral atrophy and minimal chronic small vessel ischemic disease. 3. No major intracranial arterial occlusion or significant stenosis. 4. 1 mm infundibulum versus aneurysm arising from the medial anterior genu of the right ICA.   Electronically Signed   By: Sebastian Ache   On: 06/24/2015 18:56   Mr Maxine Glenn Head/brain Wo Cm  06/24/2015   CLINICAL DATA:  TIA.  Decreased mentation.  Headache.  EXAM: MRI HEAD WITHOUT CONTRAST  MRA HEAD WITHOUT CONTRAST  TECHNIQUE: Multiplanar, multiecho pulse sequences of the brain and surrounding structures were obtained without intravenous contrast. Angiographic images of the head were obtained using MRA technique without contrast.  COMPARISON:  Head CT earlier today.  Head MRI/ MRA 03/28/2015.  FINDINGS: MRI HEAD FINDINGS  Expanded, partially empty sella is unchanged from the prior MRI. There is no evidence of acute infarct, intracranial hemorrhage, mass, midline shift, or extra-axial fluid collection. There is mild cerebral atrophy. Minimal chronic small vessel ischemic disease is again seen in the cerebral white matter. Tiny, chronic right cerebellar infarct is unchanged.   Orbits are unremarkable. Paranasal sinuses and mastoid air cells are clear. Major intracranial vascular flow voids are preserved.  MRA HEAD FINDINGS  The visualized distal vertebral arteries are patent without stenosis. PICA, AICA, and SCA origins are patent. Basilar artery is patent without stenosis. PCAs are patent without proximal stenosis. Mild PCA branch vessel irregularity is again seen bilaterally with some attenuation of left MCA branch vessels.  Internal carotid arteries are patent from skullbase to carotid termini without stenosis. There is a 1 mm medially directed bulge arising from the anterior genu on the right, unchanged. ACAs and MCAs are patent without evidence of significant stenosis. Mild MCA branch vessel irregularity is again noted bilaterally. Anterior communicating artery is patent.  IMPRESSION: 1. No acute intracranial abnormality. 2. Mild cerebral atrophy and minimal chronic small vessel ischemic disease. 3. No major intracranial arterial occlusion or significant stenosis. 4. 1 mm infundibulum versus aneurysm arising from the medial anterior genu of the right ICA.   Electronically Signed   By: Sebastian Ache   On: 06/24/2015 18:56    Assessment & Plan:   Diagnoses and all orders for this visit:  Essential hypertension -     TSH; Future -     Urinalysis; Future -     Basic metabolic panel; Future -     CBC with Differential/Platelet; Future -     IBC panel; Future -     Hepatic function panel; Future -     Hemoglobin A1c; Future  Other specified transient cerebral ischemias -     TSH; Future -     Urinalysis; Future -     Basic metabolic panel; Future -     CBC with Differential/Platelet; Future -     IBC panel; Future -     Hepatic function panel; Future -     Hemoglobin A1c; Future  B12 deficiency -     TSH; Future -     Urinalysis; Future -     Basic metabolic panel; Future -     CBC with Differential/Platelet; Future -     IBC panel; Future -     Hepatic  function panel; Future -     Hemoglobin A1c; Future  Diabetes type 2, controlled -     TSH; Future -     Urinalysis; Future -     Basic metabolic panel; Future -     CBC with Differential/Platelet; Future -     IBC panel; Future -     Hepatic function panel; Future -     Hemoglobin A1c; Future  Loss of weight  Alopecia  Other orders -     carvedilol (COREG) 3.125 MG tablet; Take 1 tablet (3.125 mg total) by mouth 2 (two) times daily with a meal. -     glucose blood (ONETOUCH VERIO)  test strip; Use as instructed -     ONETOUCH DELICA LANCETS FINE MISC; 1 Device by Does not apply route daily as needed.   I have discontinued Ms. Dahir's hydrALAZINE. I am also having her start on carvedilol, glucose blood, and ONETOUCH DELICA LANCETS FINE. Additionally, I am having her maintain her acetaminophen, aspirin, metFORMIN, Vitamin D, vitamin B-12, ACCU-CHEK SMARTVIEW, pantoprazole, and oxybutynin.  Meds ordered this encounter  Medications  . carvedilol (COREG) 3.125 MG tablet    Sig: Take 1 tablet (3.125 mg total) by mouth 2 (two) times daily with a meal.    Dispense:  60 tablet    Refill:  11  . glucose blood (ONETOUCH VERIO) test strip    Sig: Use as instructed    Dispense:  50 each    Refill:  11  . ONETOUCH DELICA LANCETS FINE MISC    Sig: 1 Device by Does not apply route daily as needed.    Dispense:  100 each    Refill:  3     Follow-up: Return in about 3 months (around 11/11/2015) for a follow-up visit.  [name], MD

## 2015-08-12 NOTE — Assessment & Plan Note (Signed)
Start Coreg Stop Hydralazine - pt refused to take it.

## 2015-08-12 NOTE — Assessment & Plan Note (Signed)
On ASA Rechecked BP is usually much better - white coat HTN On Hydralazine for HTN  No relapse

## 2015-08-15 ENCOUNTER — Other Ambulatory Visit: Payer: Self-pay | Admitting: Internal Medicine

## 2015-08-15 MED ORDER — NITROFURANTOIN MONOHYD MACRO 100 MG PO CAPS: 100.0000 mg | ORAL_CAPSULE | Freq: Two times a day (BID) | ORAL | Status: DC

## 2015-08-23 ENCOUNTER — Ambulatory Visit: Payer: Commercial Managed Care - HMO | Admitting: Internal Medicine

## 2015-08-23 DIAGNOSIS — Z0289 Encounter for other administrative examinations: Secondary | ICD-10-CM

## 2015-09-05 ENCOUNTER — Telehealth: Payer: Self-pay | Admitting: Internal Medicine

## 2015-09-05 NOTE — Telephone Encounter (Signed)
Patient states she uses [facility] for urinating.  Patient states she can not afford the [facility] anymore and she would like to know if Dr. Posey Rea can send a medication to her pharmacy to slow down her urinating.  Patient uses [facility] at [facility].  Patient is requesting call back today.

## 2015-09-07 MED ORDER — OXYBUTYNIN CHLORIDE 5 MG PO TABS: 5.0000 mg | ORAL_TABLET | Freq: Two times a day (BID) | ORAL | Status: DC

## 2015-09-07 NOTE — Telephone Encounter (Signed)
Ok Oxybutinine Thx

## 2015-09-07 NOTE — Telephone Encounter (Signed)
Pt informed

## 2015-09-09 ENCOUNTER — Telehealth: Payer: Self-pay | Admitting: Internal Medicine

## 2015-09-09 NOTE — Telephone Encounter (Signed)
I called pt- she state she has been taking the oxybutynin 5 mg 1 bid since she cam home from the hospital. She is still urinating 4-5 times/night and feels like this med is not working. Please advise

## 2015-09-09 NOTE — Telephone Encounter (Signed)
Please call patient today regarding her prescription for oxybutynin (DITROPAN) 5 MG tablet [[id number]

## 2015-09-11 NOTE — Telephone Encounter (Signed)
pls try Oxybutynin 5 mg po tid or qid Thx

## 2015-09-12 NOTE — Telephone Encounter (Signed)
Pt informed

## 2015-10-03 DIAGNOSIS — Z48815 Encounter for surgical aftercare following surgery on the digestive system: Secondary | ICD-10-CM | POA: Diagnosis not present

## 2015-10-04 ENCOUNTER — Other Ambulatory Visit: Payer: Self-pay | Admitting: Adult Health

## 2015-10-10 ENCOUNTER — Other Ambulatory Visit: Payer: Self-pay | Admitting: Internal Medicine

## 2015-10-10 MED ORDER — METFORMIN HCL 500 MG PO TABS: 500.0000 mg | ORAL_TABLET | Freq: Two times a day (BID) | ORAL | Status: DC

## 2015-10-10 NOTE — Telephone Encounter (Signed)
erx done to local and mail order.

## 2015-10-10 NOTE — Telephone Encounter (Signed)
Pt is needing a refill for metFORMIN (GLUCOPHAGE) 500 MG tablet [[id number]]  She states she has been calling this in for a week. She hasn't had any for a week now. Pharmacy is [facility] and  She is hoping you can send a prescription to last her till she gets it in the mail from [place] in to [place] on [address]

## 2015-10-14 ENCOUNTER — Other Ambulatory Visit: Payer: Self-pay | Admitting: Internal Medicine

## 2015-10-24 ENCOUNTER — Telehealth: Payer: Self-pay | Admitting: Internal Medicine

## 2015-10-24 NOTE — Telephone Encounter (Signed)
I called pt-  OV scheduled for 10/28/15 @3 :45. Should she do anything different between now and then? Please advise.

## 2015-10-24 NOTE — Telephone Encounter (Signed)
Pt called stating she thinks one of the six pills she takes may be too strong. She has no energy and its hard for her to stand up and she claims she is urinating too much. Can you please call her at [phone] to discuss

## 2015-10-24 NOTE — Telephone Encounter (Signed)
Keep ROV ?Thx ?

## 2015-10-25 NOTE — Telephone Encounter (Signed)
Pt advised.

## 2015-10-28 ENCOUNTER — Encounter: Payer: Self-pay | Admitting: Internal Medicine

## 2015-10-28 ENCOUNTER — Ambulatory Visit (INDEPENDENT_AMBULATORY_CARE_PROVIDER_SITE_OTHER): Payer: Commercial Managed Care - HMO | Admitting: Internal Medicine

## 2015-10-28 VITALS — BP 188/80 | HR 76 | Temp 98.8°F | Wt 144.0 lb

## 2015-10-28 DIAGNOSIS — M25561 Pain in right knee: Secondary | ICD-10-CM | POA: Diagnosis not present

## 2015-10-28 DIAGNOSIS — E118 Type 2 diabetes mellitus with unspecified complications: Secondary | ICD-10-CM

## 2015-10-28 DIAGNOSIS — G458 Other transient cerebral ischemic attacks and related syndromes: Secondary | ICD-10-CM

## 2015-10-28 DIAGNOSIS — M25562 Pain in left knee: Secondary | ICD-10-CM

## 2015-10-28 DIAGNOSIS — I10 Essential (primary) hypertension: Secondary | ICD-10-CM

## 2015-10-28 DIAGNOSIS — R32 Unspecified urinary incontinence: Secondary | ICD-10-CM | POA: Insufficient documentation

## 2015-10-28 DIAGNOSIS — N3946 Mixed incontinence: Secondary | ICD-10-CM

## 2015-10-28 MED ORDER — METHYLPREDNISOLONE ACETATE 80 MG/ML IJ SUSP
80.0000 mg | Freq: Once | INTRAMUSCULAR | Status: AC
  Administered 2015-10-28: 80 mg via INTRA_ARTICULAR

## 2015-10-28 NOTE — Assessment & Plan Note (Signed)
Pt asked for an injection - see Procedure L+R

## 2015-10-28 NOTE — Assessment & Plan Note (Signed)
On Hydralazine for HTN

## 2015-10-28 NOTE — Progress Notes (Signed)
Pre visit review using our clinic review tool, if applicable. No additional management support is needed unless otherwise documented below in the visit note. 

## 2015-10-28 NOTE — Assessment & Plan Note (Signed)
On Hydralazine for HTN 

## 2015-10-28 NOTE — Patient Instructions (Signed)
Postprocedure instructions :    A Band-Aid should be left on for 12 hours. Injection therapy is not a cure itself. It is used in conjunction with other modalities. You can use nonsteroidal anti-inflammatories like ibuprofen , hot and cold compresses. Rest is recommended in the next 24 hours. You need to report immediately  if fever, chills or any signs of infection develop. 

## 2015-10-28 NOTE — Progress Notes (Signed)
Subjective:  Patient ID: [name], female    DOB: 1933/08/02  Age: 81 y.o. MRN: [id number]  CC: No chief complaint on file.   HPI [name] presents for a high volume urination, frequency. Incontinent. C/o knee pain B  Outpatient Prescriptions Prior to Visit  Medication Sig Dispense Refill  . ACCU-CHEK SMARTVIEW test strip every morning.    [name] acetaminophen (TYLENOL) 650 MG CR tablet Take 1,300 mg by mouth every 8 (eight) hours as needed for pain.     [name] aspirin 325 MG tablet Take 325 mg by mouth daily.    . carvedilol (COREG) 25 MG tablet TAKE 1 TABLET TWICE DAILY WITH A MEAL 180 tablet 3  . carvedilol (COREG) 3.125 MG tablet Take 1 tablet (3.125 mg total) by mouth 2 (two) times daily with a meal. 60 tablet 11  . Cholecalciferol (VITAMIN D) 2000 UNITS CAPS Take 2,000 Units by mouth daily.     [name] glucose blood (ONETOUCH VERIO) test strip Use as instructed 50 each 11  . metFORMIN (GLUCOPHAGE) 500 MG tablet Take 1 tablet (500 mg total) by mouth 2 (two) times daily with a meal. 180 tablet 3  . nitrofurantoin, macrocrystal-monohydrate, (MACROBID) 100 MG capsule Take 1 capsule (100 mg total) by mouth 2 (two) times daily. 14 capsule 0  . ONETOUCH DELICA LANCETS FINE MISC 1 Device by Does not apply route daily as needed. 100 each 3  . oxybutynin (DITROPAN) 5 MG tablet Take 1 tablet (5 mg total) by mouth 2 (two) times daily. 60 tablet 5  . pantoprazole (PROTONIX) 40 MG tablet Take 1 tablet (40 mg total) by mouth daily. 30 tablet 5  . vitamin B-12 (CYANOCOBALAMIN) 1000 MCG tablet Take 1,000 mcg by mouth daily.     No facility-administered medications prior to visit.    ROS Review of Systems  Constitutional: Positive for fatigue. Negative for chills, activity change, appetite change and unexpected weight change.  HENT: Negative for congestion, mouth sores and sinus pressure.   Eyes: Negative for visual disturbance.  Respiratory: Positive for shortness of breath. Negative for cough  and chest tightness.   Gastrointestinal: Negative for nausea and abdominal pain.  Genitourinary: Negative for frequency, difficulty urinating and vaginal pain.  Musculoskeletal: Positive for back pain, arthralgias and gait problem.  Skin: Negative for pallor and rash.  Neurological: Positive for weakness. Negative for dizziness, tremors, numbness and headaches.  Psychiatric/Behavioral: Negative for behavioral problems, confusion and sleep disturbance.    Objective:  BP 188/80 mmHg  Pulse 76  Temp(Src) 98.8 F (37.1 C) (Oral)  Wt 144 lb (65.318 kg)  SpO2 99%  BP Readings from Last 3 Encounters:  10/28/15 188/80  08/12/15 139/82  07/01/15 160/80    Wt Readings from Last 3 Encounters:  10/28/15 144 lb (65.318 kg)  08/12/15 141 lb (63.957 kg)  07/01/15 142 lb (64.411 kg)    Physical Exam  Constitutional: She appears well-developed. No distress.  HENT:  Head: Normocephalic.  Right Ear: External ear normal.  Left Ear: External ear normal.  Nose: Nose normal.  Mouth/Throat: Oropharynx is clear and moist.  Eyes: Conjunctivae are normal. Pupils are equal, round, and reactive to light. Right eye exhibits no discharge. Left eye exhibits no discharge.  Neck: Normal range of motion. Neck supple. No JVD present. No tracheal deviation present. No thyromegaly present.  Cardiovascular: Normal rate, regular rhythm and normal heart sounds.   Pulmonary/Chest: No stridor. No respiratory distress. She has no wheezes.  Abdominal: Soft. Bowel  sounds are normal. She exhibits no distension and no mass. There is no tenderness. There is no rebound and no guarding.  Musculoskeletal: She exhibits tenderness. She exhibits no edema.  Lymphadenopathy:    She has no cervical adenopathy.  Neurological: She displays normal reflexes. No cranial nerve deficit. She exhibits normal muscle tone. Coordination abnormal.  Skin: No rash noted. No erythema.  Psychiatric: She has a normal mood and affect. Her  behavior is normal. Judgment and thought content normal.   Obese B knees are tender     Procedure Note :     Procedure : Joint Injection, R and L  knee   Indication:  Joint osteoarthritis with refractory  chronic pain.   Risks including unsuccessful procedure , bleeding, infection, bruising, skin atrophy, "steroid flare-up" and others were explained to the patient in detail as well as the benefits. Informed consent was obtained and signed.   Tthe patient was placed in a comfortable position. Lateral approach was used on B knees. Skin was prepped with Betadine and alcohol  and anesthetized a cooling spray. Then, a 5 cc syringe with a 1.5 inch long 25-gauge needle was used for a joint injection. The needle was advanced  Into the knee joint cavity. I aspirated a small amount of intra-articular fluid to confirm correct placement of the needle and injected the joint with 5 mL of 2% lidocaine and 40 mg of Depo-Medrol each .  Band-Aid was applied.   Tolerated well. Complications: None. Good pain relief following the procedure.   Postprocedure instructions :    A Band-Aid should be left on for 12 hours. Injection therapy is not a cure itself. It is used in conjunction with other modalities. You can use nonsteroidal anti-inflammatories like ibuprofen , hot and cold compresses. Rest is recommended in the next 24 hours. You need to report immediately  if fever, chills or any signs of infection develop.   Lab Results  Component Value Date   WBC 5.7 08/12/2015   HGB 10.2* 08/12/2015   HCT 31.2* 08/12/2015   PLT 281.0 08/12/2015   GLUCOSE 92 08/12/2015   CHOL 169 06/25/2015   TRIG 61 06/25/2015   HDL 57 06/25/2015   LDLDIRECT 114.8 01/10/2011   LDLCALC 100* 06/25/2015   ALT 8 08/12/2015   AST 16 08/12/2015   NA 137 08/12/2015   K 4.8 08/12/2015   CL 102 08/12/2015   CREATININE 0.90 08/12/2015   BUN 28* 08/12/2015   CO2 25 08/12/2015   TSH 1.59 08/12/2015   INR 1.03 06/24/2015   HGBA1C  5.3 08/12/2015    Ct Head Wo Contrast  06/24/2015  CLINICAL DATA:  Headache, nausea. EXAM: CT HEAD WITHOUT CONTRAST TECHNIQUE: Contiguous axial images were obtained from the base of the skull through the vertex without intravenous contrast. COMPARISON:  CT scan of Mar 27, 2015. FINDINGS: Bony calvarium appears intact. Minimal diffuse cortical atrophy is noted. Mild chronic ischemic white matter disease is noted. Stable old lacunar infarction in left basal ganglia. No mass effect or midline shift is noted. Ventricular size is within normal limits. There is no evidence of mass lesion, hemorrhage or acute infarction. IMPRESSION: Minimal diffuse cortical atrophy. Mild chronic ischemic white matter disease. Stable old left basal ganglia lacunar infarction. No acute intracranial abnormality seen. Electronically Signed   By: Lupita Raider, M.D.   On: 06/24/2015 12:41   Mr Brain Wo Contrast  06/24/2015  CLINICAL DATA:  TIA.  Decreased mentation.  Headache. EXAM: MRI HEAD WITHOUT  CONTRAST MRA HEAD WITHOUT CONTRAST TECHNIQUE: Multiplanar, multiecho pulse sequences of the brain and surrounding structures were obtained without intravenous contrast. Angiographic images of the head were obtained using MRA technique without contrast. COMPARISON:  Head CT earlier today.  Head MRI/ MRA 03/28/2015. FINDINGS: MRI HEAD FINDINGS Expanded, partially empty sella is unchanged from the prior MRI. There is no evidence of acute infarct, intracranial hemorrhage, mass, midline shift, or extra-axial fluid collection. There is mild cerebral atrophy. Minimal chronic small vessel ischemic disease is again seen in the cerebral white matter. Tiny, chronic right cerebellar infarct is unchanged. Orbits are unremarkable. Paranasal sinuses and mastoid air cells are clear. Major intracranial vascular flow voids are preserved. MRA HEAD FINDINGS The visualized distal vertebral arteries are patent without stenosis. PICA, AICA, and SCA origins are  patent. Basilar artery is patent without stenosis. PCAs are patent without proximal stenosis. Mild PCA branch vessel irregularity is again seen bilaterally with some attenuation of left MCA branch vessels. Internal carotid arteries are patent from skullbase to carotid termini without stenosis. There is a 1 mm medially directed bulge arising from the anterior genu on the right, unchanged. ACAs and MCAs are patent without evidence of significant stenosis. Mild MCA branch vessel irregularity is again noted bilaterally. Anterior communicating artery is patent. IMPRESSION: 1. No acute intracranial abnormality. 2. Mild cerebral atrophy and minimal chronic small vessel ischemic disease. 3. No major intracranial arterial occlusion or significant stenosis. 4. 1 mm infundibulum versus aneurysm arising from the medial anterior genu of the right ICA. Electronically Signed   By: Sebastian Ache   On: 06/24/2015 18:56   Mr Maxine Glenn Head/brain Wo Cm  06/24/2015  CLINICAL DATA:  TIA.  Decreased mentation.  Headache. EXAM: MRI HEAD WITHOUT CONTRAST MRA HEAD WITHOUT CONTRAST TECHNIQUE: Multiplanar, multiecho pulse sequences of the brain and surrounding structures were obtained without intravenous contrast. Angiographic images of the head were obtained using MRA technique without contrast. COMPARISON:  Head CT earlier today.  Head MRI/ MRA 03/28/2015. FINDINGS: MRI HEAD FINDINGS Expanded, partially empty sella is unchanged from the prior MRI. There is no evidence of acute infarct, intracranial hemorrhage, mass, midline shift, or extra-axial fluid collection. There is mild cerebral atrophy. Minimal chronic small vessel ischemic disease is again seen in the cerebral white matter. Tiny, chronic right cerebellar infarct is unchanged. Orbits are unremarkable. Paranasal sinuses and mastoid air cells are clear. Major intracranial vascular flow voids are preserved. MRA HEAD FINDINGS The visualized distal vertebral arteries are patent without  stenosis. PICA, AICA, and SCA origins are patent. Basilar artery is patent without stenosis. PCAs are patent without proximal stenosis. Mild PCA branch vessel irregularity is again seen bilaterally with some attenuation of left MCA branch vessels. Internal carotid arteries are patent from skullbase to carotid termini without stenosis. There is a 1 mm medially directed bulge arising from the anterior genu on the right, unchanged. ACAs and MCAs are patent without evidence of significant stenosis. Mild MCA branch vessel irregularity is again noted bilaterally. Anterior communicating artery is patent. IMPRESSION: 1. No acute intracranial abnormality. 2. Mild cerebral atrophy and minimal chronic small vessel ischemic disease. 3. No major intracranial arterial occlusion or significant stenosis. 4. 1 mm infundibulum versus aneurysm arising from the medial anterior genu of the right ICA. Electronically Signed   By: Sebastian Ache   On: 06/24/2015 18:56    Assessment & Plan:   There are no diagnoses linked to this encounter. I am having Ms. Vullo maintain her acetaminophen, aspirin, Vitamin D,  vitamin B-12, ACCU-CHEK SMARTVIEW, pantoprazole, carvedilol, glucose blood, ONETOUCH DELICA LANCETS FINE, nitrofurantoin (macrocrystal-monohydrate), oxybutynin, metFORMIN, carvedilol, and hydrALAZINE.  Meds ordered this encounter  Medications  . hydrALAZINE (APRESOLINE) 50 MG tablet    Sig: Take 1 tablet by mouth daily.     Follow-up: No Follow-up on file.  [name], MD

## 2015-10-28 NOTE — Assessment & Plan Note (Addendum)
Chronic Oxybut prn Rx for Depends CBG ok

## 2015-10-31 LAB — GLUCOSE, POCT (MANUAL RESULT ENTRY): POC GLUCOSE: 129 mg/dL — AB (ref 70–99)

## 2015-10-31 NOTE — Addendum Note (Signed)
Addended by: Merrilyn Puma on: 10/31/2015 01:47 PM   Modules accepted: Orders

## 2015-11-29 ENCOUNTER — Encounter: Payer: Self-pay | Admitting: Internal Medicine

## 2015-11-29 ENCOUNTER — Ambulatory Visit (INDEPENDENT_AMBULATORY_CARE_PROVIDER_SITE_OTHER): Payer: Commercial Managed Care - HMO | Admitting: Internal Medicine

## 2015-11-29 VITALS — BP 150/80 | HR 80 | Wt 142.0 lb

## 2015-11-29 DIAGNOSIS — M353 Polymyalgia rheumatica: Secondary | ICD-10-CM

## 2015-11-29 DIAGNOSIS — I10 Essential (primary) hypertension: Secondary | ICD-10-CM | POA: Diagnosis not present

## 2015-11-29 DIAGNOSIS — E538 Deficiency of other specified B group vitamins: Secondary | ICD-10-CM

## 2015-11-29 DIAGNOSIS — G458 Other transient cerebral ischemic attacks and related syndromes: Secondary | ICD-10-CM | POA: Diagnosis not present

## 2015-11-29 MED ORDER — CARVEDILOL 25 MG PO TABS: ORAL_TABLET | ORAL | Status: DC

## 2015-11-29 MED ORDER — METHYLPREDNISOLONE ACETATE 80 MG/ML IJ SUSP
80.0000 mg | Freq: Once | INTRAMUSCULAR | Status: AC
  Administered 2015-11-29: 80 mg via INTRAMUSCULAR

## 2015-11-29 MED ORDER — HYDRALAZINE HCL 50 MG PO TABS: 50.0000 mg | ORAL_TABLET | Freq: Every day | ORAL | Status: DC

## 2015-11-29 NOTE — Assessment & Plan Note (Addendum)
Depomedrol 80 mg IM Pains are better after wt loss Labs Risks associated with treatment noncompliance were discussed. Compliance was encouraged.

## 2015-11-29 NOTE — Assessment & Plan Note (Signed)
Rechecked BP is better - white coat HTN On Hydralazine

## 2015-11-29 NOTE — Assessment & Plan Note (Signed)
On B12 

## 2015-11-29 NOTE — Progress Notes (Signed)
Pre visit review using our clinic review tool, if applicable. No additional management support is needed unless otherwise documented below in the visit note. 

## 2015-11-29 NOTE — Progress Notes (Signed)
Subjective:  Patient ID: [name], female    DOB: [birth year]  Age: 82 y.o. MRN: [id number]  CC: No chief complaint on file.   HPI [name] presents for a high volume urination, frequency. Pt is incontinent. C/o knee pain B  Outpatient Prescriptions Prior to Visit  Medication Sig Dispense Refill  . ACCU-CHEK SMARTVIEW test strip every morning.    [name] acetaminophen (TYLENOL) 650 MG CR tablet Take 1,300 mg by mouth every 8 (eight) hours as needed for pain.     [name] aspirin 325 MG tablet Take 325 mg by mouth daily.    . Cholecalciferol (VITAMIN D) 2000 UNITS CAPS Take 2,000 Units by mouth daily.     [name] glucose blood (ONETOUCH VERIO) test strip Use as instructed 50 each 11  . metFORMIN (GLUCOPHAGE) 500 MG tablet Take 1 tablet (500 mg total) by mouth 2 (two) times daily with a meal. 180 tablet 3  . ONETOUCH DELICA LANCETS FINE MISC 1 Device by Does not apply route daily as needed. 100 each 3  . oxybutynin (DITROPAN) 5 MG tablet Take 1 tablet (5 mg total) by mouth 2 (two) times daily. 60 tablet 5  . pantoprazole (PROTONIX) 40 MG tablet Take 1 tablet (40 mg total) by mouth daily. 30 tablet 5  . vitamin B-12 (CYANOCOBALAMIN) 1000 MCG tablet Take 1,000 mcg by mouth daily.    . carvedilol (COREG) 25 MG tablet TAKE 1 TABLET TWICE DAILY WITH A MEAL 180 tablet 3  . carvedilol (COREG) 3.125 MG tablet Take 1 tablet (3.125 mg total) by mouth 2 (two) times daily with a meal. (Patient not taking: Reported on 11/29/2015) 60 tablet 11  . hydrALAZINE (APRESOLINE) 50 MG tablet Take 1 tablet by mouth daily. Reported on 11/29/2015    . nitrofurantoin, macrocrystal-monohydrate, (MACROBID) 100 MG capsule Take 1 capsule (100 mg total) by mouth 2 (two) times daily. (Patient not taking: Reported on 11/29/2015) 14 capsule 0   No facility-administered medications prior to visit.    ROS Review of Systems  Constitutional: Positive for fatigue. Negative for chills, activity change, appetite change and unexpected  weight change.  HENT: Negative for congestion, mouth sores and sinus pressure.   Eyes: Negative for visual disturbance.  Respiratory: Negative for cough and chest tightness.   Gastrointestinal: Negative for nausea and abdominal pain.  Genitourinary: Positive for urgency. Negative for frequency, difficulty urinating and vaginal pain.  Musculoskeletal: Positive for back pain, arthralgias and gait problem.  Skin: Negative for pallor and rash.  Neurological: Positive for weakness. Negative for dizziness, tremors, numbness and headaches.  Psychiatric/Behavioral: Negative for confusion and sleep disturbance.    Objective:  BP 150/80 mmHg  Pulse 80  Wt 142 lb (64.411 kg)  SpO2 97%  BP Readings from Last 3 Encounters:  11/29/15 150/80  10/28/15 188/80  08/12/15 139/82    Wt Readings from Last 3 Encounters:  11/29/15 142 lb (64.411 kg)  10/28/15 144 lb (65.318 kg)  08/12/15 141 lb (63.957 kg)    Physical Exam  Constitutional: She appears well-developed. No distress.  HENT:  Head: Normocephalic.  Right Ear: External ear normal.  Left Ear: External ear normal.  Nose: Nose normal.  Mouth/Throat: Oropharynx is clear and moist.  Eyes: Conjunctivae are normal. Pupils are equal, round, and reactive to light. Right eye exhibits no discharge. Left eye exhibits no discharge.  Neck: Normal range of motion. Neck supple. No JVD present. No tracheal deviation present. No thyromegaly present.  Cardiovascular: Normal rate, regular  rhythm and normal heart sounds.   Pulmonary/Chest: No stridor. No respiratory distress. She has no wheezes.  Abdominal: Soft. Bowel sounds are normal. She exhibits no distension and no mass. There is no tenderness. There is no rebound and no guarding.  Musculoskeletal: She exhibits tenderness. She exhibits no edema.  Lymphadenopathy:    She has no cervical adenopathy.  Neurological: She displays normal reflexes. No cranial nerve deficit. She exhibits normal muscle  tone. Coordination abnormal.  Skin: No rash noted. No erythema. No pallor.  Psychiatric: She has a normal mood and affect. Her behavior is normal. Judgment and thought content normal.    Lab Results  Component Value Date   WBC 5.7 08/12/2015   HGB 10.2* 08/12/2015   HCT 31.2* 08/12/2015   PLT 281.0 08/12/2015   GLUCOSE 92 08/12/2015   CHOL 169 06/25/2015   TRIG 61 06/25/2015   HDL 57 06/25/2015   LDLDIRECT 114.8 01/10/2011   LDLCALC 100* 06/25/2015   ALT 8 08/12/2015   AST 16 08/12/2015   NA 137 08/12/2015   K 4.8 08/12/2015   CL 102 08/12/2015   CREATININE 0.90 08/12/2015   BUN 28* 08/12/2015   CO2 25 08/12/2015   TSH 1.59 08/12/2015   INR 1.03 06/24/2015   HGBA1C 5.3 08/12/2015    Ct Head Wo Contrast  06/24/2015  CLINICAL DATA:  Headache, nausea. EXAM: CT HEAD WITHOUT CONTRAST TECHNIQUE: Contiguous axial images were obtained from the base of the skull through the vertex without intravenous contrast. COMPARISON:  CT scan of Mar 27, 2015. FINDINGS: Bony calvarium appears intact. Minimal diffuse cortical atrophy is noted. Mild chronic ischemic white matter disease is noted. Stable old lacunar infarction in left basal ganglia. No mass effect or midline shift is noted. Ventricular size is within normal limits. There is no evidence of mass lesion, hemorrhage or acute infarction. IMPRESSION: Minimal diffuse cortical atrophy. Mild chronic ischemic white matter disease. Stable old left basal ganglia lacunar infarction. No acute intracranial abnormality seen. Electronically Signed   By: Lupita Raider, M.D.   On: 06/24/2015 12:41   Mr Brain Wo Contrast  06/24/2015  CLINICAL DATA:  TIA.  Decreased mentation.  Headache. EXAM: MRI HEAD WITHOUT CONTRAST MRA HEAD WITHOUT CONTRAST TECHNIQUE: Multiplanar, multiecho pulse sequences of the brain and surrounding structures were obtained without intravenous contrast. Angiographic images of the head were obtained using MRA technique without contrast.  COMPARISON:  Head CT earlier today.  Head MRI/ MRA 03/28/2015. FINDINGS: MRI HEAD FINDINGS Expanded, partially empty sella is unchanged from the prior MRI. There is no evidence of acute infarct, intracranial hemorrhage, mass, midline shift, or extra-axial fluid collection. There is mild cerebral atrophy. Minimal chronic small vessel ischemic disease is again seen in the cerebral white matter. Tiny, chronic right cerebellar infarct is unchanged. Orbits are unremarkable. Paranasal sinuses and mastoid air cells are clear. Major intracranial vascular flow voids are preserved. MRA HEAD FINDINGS The visualized distal vertebral arteries are patent without stenosis. PICA, AICA, and SCA origins are patent. Basilar artery is patent without stenosis. PCAs are patent without proximal stenosis. Mild PCA branch vessel irregularity is again seen bilaterally with some attenuation of left MCA branch vessels. Internal carotid arteries are patent from skullbase to carotid termini without stenosis. There is a 1 mm medially directed bulge arising from the anterior genu on the right, unchanged. ACAs and MCAs are patent without evidence of significant stenosis. Mild MCA branch vessel irregularity is again noted bilaterally. Anterior communicating artery is patent. IMPRESSION: 1.  No acute intracranial abnormality. 2. Mild cerebral atrophy and minimal chronic small vessel ischemic disease. 3. No major intracranial arterial occlusion or significant stenosis. 4. 1 mm infundibulum versus aneurysm arising from the medial anterior genu of the right ICA. Electronically Signed   By: Sebastian Ache   On: 06/24/2015 18:56   Mr Maxine Glenn Head/brain Wo Cm  06/24/2015  CLINICAL DATA:  TIA.  Decreased mentation.  Headache. EXAM: MRI HEAD WITHOUT CONTRAST MRA HEAD WITHOUT CONTRAST TECHNIQUE: Multiplanar, multiecho pulse sequences of the brain and surrounding structures were obtained without intravenous contrast. Angiographic images of the head were obtained  using MRA technique without contrast. COMPARISON:  Head CT earlier today.  Head MRI/ MRA 03/28/2015. FINDINGS: MRI HEAD FINDINGS Expanded, partially empty sella is unchanged from the prior MRI. There is no evidence of acute infarct, intracranial hemorrhage, mass, midline shift, or extra-axial fluid collection. There is mild cerebral atrophy. Minimal chronic small vessel ischemic disease is again seen in the cerebral white matter. Tiny, chronic right cerebellar infarct is unchanged. Orbits are unremarkable. Paranasal sinuses and mastoid air cells are clear. Major intracranial vascular flow voids are preserved. MRA HEAD FINDINGS The visualized distal vertebral arteries are patent without stenosis. PICA, AICA, and SCA origins are patent. Basilar artery is patent without stenosis. PCAs are patent without proximal stenosis. Mild PCA branch vessel irregularity is again seen bilaterally with some attenuation of left MCA branch vessels. Internal carotid arteries are patent from skullbase to carotid termini without stenosis. There is a 1 mm medially directed bulge arising from the anterior genu on the right, unchanged. ACAs and MCAs are patent without evidence of significant stenosis. Mild MCA branch vessel irregularity is again noted bilaterally. Anterior communicating artery is patent. IMPRESSION: 1. No acute intracranial abnormality. 2. Mild cerebral atrophy and minimal chronic small vessel ischemic disease. 3. No major intracranial arterial occlusion or significant stenosis. 4. 1 mm infundibulum versus aneurysm arising from the medial anterior genu of the right ICA. Electronically Signed   By: Sebastian Ache   On: 06/24/2015 18:56    Assessment & Plan:   Diagnoses and all orders for this visit:  Polymyalgia rheumatica (HCC) -     Basic metabolic panel; Future -     Sedimentation rate; Future -     methylPREDNISolone acetate (DEPO-MEDROL) injection 80 mg; Inject 1 mL (80 mg total) into the muscle once.  B12  deficiency  Essential hypertension  Other specified transient cerebral ischemias  Other orders -     hydrALAZINE (APRESOLINE) 50 MG tablet; Take 1 tablet (50 mg total) by mouth daily. Reported on 11/29/2015 -     carvedilol (COREG) 25 MG tablet; TAKE 1 TABLET TWICE DAILY WITH A MEAL  I have discontinued Ms. Holycross's nitrofurantoin (macrocrystal-monohydrate). I have also changed her hydrALAZINE. Additionally, I am having her maintain her acetaminophen, aspirin, Vitamin D, vitamin B-12, ACCU-CHEK SMARTVIEW, pantoprazole, glucose blood, ONETOUCH DELICA LANCETS FINE, oxybutynin, metFORMIN, and carvedilol. We administered methylPREDNISolone acetate.  Meds ordered this encounter  Medications  . hydrALAZINE (APRESOLINE) 50 MG tablet    Sig: Take 1 tablet (50 mg total) by mouth daily. Reported on 11/29/2015    Dispense:  90 tablet    Refill:  3  . carvedilol (COREG) 25 MG tablet    Sig: TAKE 1 TABLET TWICE DAILY WITH A MEAL    Dispense:  180 tablet    Refill:  3  . methylPREDNISolone acetate (DEPO-MEDROL) injection 80 mg    Sig:  Follow-up: Return in about 3 months (around 02/27/2016).  [name], MD

## 2015-11-29 NOTE — Assessment & Plan Note (Signed)
8/16 ASA  On Hydralazine for HTN

## 2015-12-20 ENCOUNTER — Telehealth: Payer: Self-pay | Admitting: Internal Medicine

## 2015-12-20 NOTE — Telephone Encounter (Signed)
[name] should not be driving. Thx

## 2015-12-20 NOTE — Telephone Encounter (Signed)
Notified pt with md response.../lmb 

## 2015-12-20 NOTE — Telephone Encounter (Signed)
Pt called in and wanted to ask Dr Macario Golds if she is able to get her Drivers licence renewed?

## 2016-01-06 ENCOUNTER — Telehealth: Payer: Self-pay | Admitting: Internal Medicine

## 2016-01-06 MED ORDER — OXYBUTYNIN CHLORIDE 5 MG PO TABS: 5.0000 mg | ORAL_TABLET | Freq: Two times a day (BID) | ORAL | Status: DC

## 2016-01-06 NOTE — Telephone Encounter (Signed)
erx done.   Pt informed.

## 2016-01-06 NOTE — Telephone Encounter (Signed)
Pt is out of oxybutynin (DITROPAN) 5 MG tablet [[id number] and [employer] won't be able to get it to her for 7-10 days.  They told her to call us to get a 2 wk supply sent to [place] on [address]

## 2016-01-23 ENCOUNTER — Other Ambulatory Visit: Payer: Self-pay | Admitting: Internal Medicine

## 2016-02-28 ENCOUNTER — Ambulatory Visit (INDEPENDENT_AMBULATORY_CARE_PROVIDER_SITE_OTHER): Payer: Commercial Managed Care - HMO | Admitting: Internal Medicine

## 2016-02-28 ENCOUNTER — Other Ambulatory Visit (INDEPENDENT_AMBULATORY_CARE_PROVIDER_SITE_OTHER): Payer: Commercial Managed Care - HMO

## 2016-02-28 ENCOUNTER — Encounter: Payer: Self-pay | Admitting: Internal Medicine

## 2016-02-28 VITALS — BP 162/86 | HR 65 | Wt 143.0 lb

## 2016-02-28 DIAGNOSIS — R5382 Chronic fatigue, unspecified: Secondary | ICD-10-CM

## 2016-02-28 DIAGNOSIS — M353 Polymyalgia rheumatica: Secondary | ICD-10-CM

## 2016-02-28 DIAGNOSIS — N390 Urinary tract infection, site not specified: Secondary | ICD-10-CM

## 2016-02-28 DIAGNOSIS — E538 Deficiency of other specified B group vitamins: Secondary | ICD-10-CM

## 2016-02-28 DIAGNOSIS — G9332 Myalgic encephalomyelitis/chronic fatigue syndrome: Secondary | ICD-10-CM

## 2016-02-28 DIAGNOSIS — E1121 Type 2 diabetes mellitus with diabetic nephropathy: Secondary | ICD-10-CM

## 2016-02-28 LAB — URINALYSIS, ROUTINE W REFLEX MICROSCOPIC
Bilirubin Urine: NEGATIVE
HGB URINE DIPSTICK: NEGATIVE
Ketones, ur: NEGATIVE
Nitrite: POSITIVE — AB
PH: 5.5 (ref 5.0–8.0)
SPECIFIC GRAVITY, URINE: 1.02 (ref 1.000–1.030)
TOTAL PROTEIN, URINE-UPE24: 100 — AB
UROBILINOGEN UA: 0.2 (ref 0.0–1.0)
Urine Glucose: NEGATIVE

## 2016-02-28 LAB — SEDIMENTATION RATE: SED RATE: 70 mm/h — AB (ref 0–22)

## 2016-02-28 LAB — BASIC METABOLIC PANEL
BUN: 31 mg/dL — ABNORMAL HIGH (ref 6–23)
CALCIUM: 9.7 mg/dL (ref 8.4–10.5)
CO2: 25 mEq/L (ref 19–32)
CREATININE: 0.98 mg/dL (ref 0.40–1.20)
Chloride: 105 mEq/L (ref 96–112)
GFR: 70.07 mL/min (ref >60.00)
Glucose, Bld: 89 mg/dL (ref 70–99)
Potassium: 4.6 mEq/L (ref 3.5–5.1)
SODIUM: 137 meq/L (ref 135–145)

## 2016-02-28 LAB — HEMOGLOBIN A1C: HEMOGLOBIN A1C: 6.2 % (ref 4.6–6.5)

## 2016-02-28 MED ORDER — METHYLPREDNISOLONE ACETATE 80 MG/ML IJ SUSP
80.0000 mg | Freq: Once | INTRAMUSCULAR | Status: AC
  Administered 2016-02-28: 80 mg via INTRAMUSCULAR

## 2016-02-28 MED ORDER — CEFUROXIME AXETIL 250 MG PO TABS: 250.0000 mg | ORAL_TABLET | Freq: Two times a day (BID) | ORAL | Status: DC

## 2016-02-28 MED ORDER — CYANOCOBALAMIN 1000 MCG/ML IJ SOLN
1000.0000 ug | Freq: Once | INTRAMUSCULAR | Status: AC
  Administered 2016-02-28: 1000 ug via INTRAMUSCULAR

## 2016-02-28 NOTE — Assessment & Plan Note (Signed)
Depomedrol 80 mg IM Steroids 

## 2016-02-28 NOTE — Assessment & Plan Note (Signed)
On Metformin Labs 

## 2016-02-28 NOTE — Progress Notes (Signed)
Subjective:  Patient ID: [name], female    DOB: [birth year]  Age: 82 y.o. MRN: [id number]  CC: No chief complaint on file.   HPI [name] presents for PMR, OA, DM f/u C/o GERD and burping at times  Outpatient Prescriptions Prior to Visit  Medication Sig Dispense Refill  . ACCU-CHEK SMARTVIEW test strip every morning.    [name] acetaminophen (TYLENOL) 650 MG CR tablet Take 1,300 mg by mouth every 8 (eight) hours as needed for pain.     [name] aspirin 325 MG tablet Take 325 mg by mouth daily.    . carvedilol (COREG) 25 MG tablet TAKE 1 TABLET TWICE DAILY WITH A MEAL 180 tablet 3  . Cholecalciferol (VITAMIN D) 2000 UNITS CAPS Take 2,000 Units by mouth daily.     [name] glucose blood (ONETOUCH VERIO) test strip Use as instructed 50 each 11  . hydrALAZINE (APRESOLINE) 50 MG tablet Take 1 tablet (50 mg total) by mouth daily. Reported on 11/29/2015 90 tablet 3  . metFORMIN (GLUCOPHAGE) 500 MG tablet Take 1 tablet (500 mg total) by mouth 2 (two) times daily with a meal. 180 tablet 3  . ONETOUCH DELICA LANCETS FINE MISC 1 Device by Does not apply route daily as needed. 100 each 3  . oxybutynin (DITROPAN) 5 MG tablet TAKE 1 TABLET TWICE DAILY 180 tablet 3  . pantoprazole (PROTONIX) 40 MG tablet Take 1 tablet (40 mg total) by mouth daily. 30 tablet 5  . vitamin B-12 (CYANOCOBALAMIN) 1000 MCG tablet Take 1,000 mcg by mouth daily.     No facility-administered medications prior to visit.    ROS Review of Systems  Constitutional: Negative for chills, activity change, appetite change, fatigue and unexpected weight change.  HENT: Negative for congestion, mouth sores and sinus pressure.   Eyes: Negative for visual disturbance.  Respiratory: Negative for cough and chest tightness.   Gastrointestinal: Negative for nausea and abdominal pain.  Genitourinary: Positive for dysuria. Negative for frequency, difficulty urinating and vaginal pain.  Musculoskeletal: Positive for back pain, arthralgias and  gait problem.  Skin: Negative for pallor and rash.  Neurological: Negative for dizziness, tremors, weakness, numbness and headaches.  Psychiatric/Behavioral: Negative for confusion and sleep disturbance.    Objective:  BP 162/86 mmHg  Pulse 65  Wt 143 lb (64.864 kg)  SpO2 97%  BP Readings from Last 3 Encounters:  02/28/16 162/86  11/29/15 150/80  10/28/15 188/80    Wt Readings from Last 3 Encounters:  02/28/16 143 lb (64.864 kg)  11/29/15 142 lb (64.411 kg)  10/28/15 144 lb (65.318 kg)    Physical Exam  Constitutional: She appears well-developed. No distress.  HENT:  Head: Normocephalic.  Right Ear: External ear normal.  Left Ear: External ear normal.  Nose: Nose normal.  Mouth/Throat: Oropharynx is clear and moist.  Eyes: Conjunctivae are normal. Pupils are equal, round, and reactive to light. Right eye exhibits no discharge. Left eye exhibits no discharge.  Neck: Normal range of motion. Neck supple. No JVD present. No tracheal deviation present. No thyromegaly present.  Cardiovascular: Normal rate, regular rhythm and normal heart sounds.   Pulmonary/Chest: No stridor. No respiratory distress. She has no wheezes.  Abdominal: Soft. Bowel sounds are normal. She exhibits no distension and no mass. There is no tenderness. There is no rebound and no guarding.  Musculoskeletal: She exhibits tenderness. She exhibits no edema.  Lymphadenopathy:    She has no cervical adenopathy.  Neurological: She displays normal reflexes. No cranial  nerve deficit. She exhibits normal muscle tone. Coordination abnormal.  Skin: No rash noted. No erythema.  Psychiatric: She has a normal mood and affect. Her behavior is normal. Judgment and thought content normal.  cane Weak LEs Ataxic   Lab Results  Component Value Date   WBC 5.7 08/12/2015   HGB 10.2* 08/12/2015   HCT 31.2* 08/12/2015   PLT 281.0 08/12/2015   GLUCOSE 92 08/12/2015   CHOL 169 06/25/2015   TRIG 61 06/25/2015   HDL 57  06/25/2015   LDLDIRECT 114.8 01/10/2011   LDLCALC 100* 06/25/2015   ALT 8 08/12/2015   AST 16 08/12/2015   NA 137 08/12/2015   K 4.8 08/12/2015   CL 102 08/12/2015   CREATININE 0.90 08/12/2015   BUN 28* 08/12/2015   CO2 25 08/12/2015   TSH 1.59 08/12/2015   INR 1.03 06/24/2015   HGBA1C 5.3 08/12/2015    Ct Head Wo Contrast  06/24/2015  CLINICAL DATA:  Headache, nausea. EXAM: CT HEAD WITHOUT CONTRAST TECHNIQUE: Contiguous axial images were obtained from the base of the skull through the vertex without intravenous contrast. COMPARISON:  CT scan of Mar 27, 2015. FINDINGS: Bony calvarium appears intact. Minimal diffuse cortical atrophy is noted. Mild chronic ischemic white matter disease is noted. Stable old lacunar infarction in left basal ganglia. No mass effect or midline shift is noted. Ventricular size is within normal limits. There is no evidence of mass lesion, hemorrhage or acute infarction. IMPRESSION: Minimal diffuse cortical atrophy. Mild chronic ischemic white matter disease. Stable old left basal ganglia lacunar infarction. No acute intracranial abnormality seen. Electronically Signed   By: Lupita Raider, M.D.   On: 06/24/2015 12:41   Mr Brain Wo Contrast  06/24/2015  CLINICAL DATA:  TIA.  Decreased mentation.  Headache. EXAM: MRI HEAD WITHOUT CONTRAST MRA HEAD WITHOUT CONTRAST TECHNIQUE: Multiplanar, multiecho pulse sequences of the brain and surrounding structures were obtained without intravenous contrast. Angiographic images of the head were obtained using MRA technique without contrast. COMPARISON:  Head CT earlier today.  Head MRI/ MRA 03/28/2015. FINDINGS: MRI HEAD FINDINGS Expanded, partially empty sella is unchanged from the prior MRI. There is no evidence of acute infarct, intracranial hemorrhage, mass, midline shift, or extra-axial fluid collection. There is mild cerebral atrophy. Minimal chronic small vessel ischemic disease is again seen in the cerebral white matter. Tiny,  chronic right cerebellar infarct is unchanged. Orbits are unremarkable. Paranasal sinuses and mastoid air cells are clear. Major intracranial vascular flow voids are preserved. MRA HEAD FINDINGS The visualized distal vertebral arteries are patent without stenosis. PICA, AICA, and SCA origins are patent. Basilar artery is patent without stenosis. PCAs are patent without proximal stenosis. Mild PCA branch vessel irregularity is again seen bilaterally with some attenuation of left MCA branch vessels. Internal carotid arteries are patent from skullbase to carotid termini without stenosis. There is a 1 mm medially directed bulge arising from the anterior genu on the right, unchanged. ACAs and MCAs are patent without evidence of significant stenosis. Mild MCA branch vessel irregularity is again noted bilaterally. Anterior communicating artery is patent. IMPRESSION: 1. No acute intracranial abnormality. 2. Mild cerebral atrophy and minimal chronic small vessel ischemic disease. 3. No major intracranial arterial occlusion or significant stenosis. 4. 1 mm infundibulum versus aneurysm arising from the medial anterior genu of the right ICA. Electronically Signed   By: Sebastian Ache   On: 06/24/2015 18:56   Mr Maxine Glenn Head/brain Wo Cm  06/24/2015  CLINICAL DATA:  TIA.  Decreased mentation.  Headache. EXAM: MRI HEAD WITHOUT CONTRAST MRA HEAD WITHOUT CONTRAST TECHNIQUE: Multiplanar, multiecho pulse sequences of the brain and surrounding structures were obtained without intravenous contrast. Angiographic images of the head were obtained using MRA technique without contrast. COMPARISON:  Head CT earlier today.  Head MRI/ MRA 03/28/2015. FINDINGS: MRI HEAD FINDINGS Expanded, partially empty sella is unchanged from the prior MRI. There is no evidence of acute infarct, intracranial hemorrhage, mass, midline shift, or extra-axial fluid collection. There is mild cerebral atrophy. Minimal chronic small vessel ischemic disease is again seen  in the cerebral white matter. Tiny, chronic right cerebellar infarct is unchanged. Orbits are unremarkable. Paranasal sinuses and mastoid air cells are clear. Major intracranial vascular flow voids are preserved. MRA HEAD FINDINGS The visualized distal vertebral arteries are patent without stenosis. PICA, AICA, and SCA origins are patent. Basilar artery is patent without stenosis. PCAs are patent without proximal stenosis. Mild PCA branch vessel irregularity is again seen bilaterally with some attenuation of left MCA branch vessels. Internal carotid arteries are patent from skullbase to carotid termini without stenosis. There is a 1 mm medially directed bulge arising from the anterior genu on the right, unchanged. ACAs and MCAs are patent without evidence of significant stenosis. Mild MCA branch vessel irregularity is again noted bilaterally. Anterior communicating artery is patent. IMPRESSION: 1. No acute intracranial abnormality. 2. Mild cerebral atrophy and minimal chronic small vessel ischemic disease. 3. No major intracranial arterial occlusion or significant stenosis. 4. 1 mm infundibulum versus aneurysm arising from the medial anterior genu of the right ICA. Electronically Signed   By: Sebastian Ache   On: 06/24/2015 18:56    Assessment & Plan:   There are no diagnoses linked to this encounter. I am having [name] maintain her acetaminophen, aspirin, Vitamin D, vitamin B-12, ACCU-CHEK SMARTVIEW, pantoprazole, glucose blood, ONETOUCH DELICA LANCETS FINE, metFORMIN, hydrALAZINE, carvedilol, and oxybutynin.  No orders of the defined types were placed in this encounter.     Follow-up: No Follow-up on file.  [name], MD

## 2016-02-28 NOTE — Progress Notes (Signed)
Pre visit review using our clinic review tool, if applicable. No additional management support is needed unless otherwise documented below in the visit note. 

## 2016-02-28 NOTE — Assessment & Plan Note (Signed)
B12 inj 

## 2016-02-28 NOTE — Assessment & Plan Note (Signed)
ceftin po 

## 2016-02-28 NOTE — Assessment & Plan Note (Signed)
Depomedrol 80 mg IM Steroids

## 2016-03-15 DIAGNOSIS — H02831 Dermatochalasis of right upper eyelid: Secondary | ICD-10-CM | POA: Diagnosis not present

## 2016-03-15 DIAGNOSIS — H25813 Combined forms of age-related cataract, bilateral: Secondary | ICD-10-CM | POA: Diagnosis not present

## 2016-03-15 DIAGNOSIS — H02834 Dermatochalasis of left upper eyelid: Secondary | ICD-10-CM | POA: Diagnosis not present

## 2016-03-15 DIAGNOSIS — E119 Type 2 diabetes mellitus without complications: Secondary | ICD-10-CM | POA: Diagnosis not present

## 2016-03-15 LAB — HM DIABETES EYE EXAM

## 2016-04-04 ENCOUNTER — Encounter: Payer: Self-pay | Admitting: Internal Medicine

## 2016-04-09 DIAGNOSIS — H25812 Combined forms of age-related cataract, left eye: Secondary | ICD-10-CM | POA: Diagnosis not present

## 2016-04-09 DIAGNOSIS — H2512 Age-related nuclear cataract, left eye: Secondary | ICD-10-CM | POA: Diagnosis not present

## 2016-04-18 ENCOUNTER — Emergency Department (HOSPITAL_COMMUNITY)
Admission: EM | Admit: 2016-04-18 | Discharge: 2016-04-18 | Disposition: A | Discharge status: Home / Self Care | Payer: Commercial Managed Care - HMO | Attending: Emergency Medicine | Admitting: Emergency Medicine

## 2016-04-18 ENCOUNTER — Encounter (HOSPITAL_COMMUNITY): Payer: Self-pay | Admitting: Emergency Medicine

## 2016-04-18 ENCOUNTER — Telehealth: Payer: Self-pay | Admitting: Internal Medicine

## 2016-04-18 DIAGNOSIS — R531 Weakness: Secondary | ICD-10-CM | POA: Insufficient documentation

## 2016-04-18 DIAGNOSIS — Z7982 Long term (current) use of aspirin: Secondary | ICD-10-CM | POA: Diagnosis not present

## 2016-04-18 DIAGNOSIS — F329 Major depressive disorder, single episode, unspecified: Secondary | ICD-10-CM

## 2016-04-18 DIAGNOSIS — I10 Essential (primary) hypertension: Secondary | ICD-10-CM | POA: Insufficient documentation

## 2016-04-18 DIAGNOSIS — Z7984 Long term (current) use of oral hypoglycemic drugs: Secondary | ICD-10-CM | POA: Diagnosis not present

## 2016-04-18 DIAGNOSIS — E785 Hyperlipidemia, unspecified: Secondary | ICD-10-CM | POA: Insufficient documentation

## 2016-04-18 DIAGNOSIS — F32A Depression, unspecified: Secondary | ICD-10-CM

## 2016-04-18 DIAGNOSIS — R4182 Altered mental status, unspecified: Secondary | ICD-10-CM | POA: Diagnosis present

## 2016-04-18 DIAGNOSIS — Z79899 Other long term (current) drug therapy: Secondary | ICD-10-CM | POA: Insufficient documentation

## 2016-04-18 DIAGNOSIS — E119 Type 2 diabetes mellitus without complications: Secondary | ICD-10-CM | POA: Insufficient documentation

## 2016-04-18 DIAGNOSIS — I2510 Atherosclerotic heart disease of native coronary artery without angina pectoris: Secondary | ICD-10-CM | POA: Insufficient documentation

## 2016-04-18 LAB — CBC
HCT: 29 % — ABNORMAL LOW (ref 36.0–46.0)
HEMOGLOBIN: 9.3 g/dL — AB (ref 12.0–15.0)
MCH: 29.9 pg (ref 26.0–34.0)
MCHC: 32.1 g/dL (ref 30.0–36.0)
MCV: 93.2 fL (ref 78.0–100.0)
PLATELETS: 257 10*3/uL (ref 150–400)
RBC: 3.11 MIL/uL — AB (ref 3.87–5.11)
RDW: 12.6 % (ref 11.5–15.5)
WBC: 5.3 10*3/uL (ref 4.0–10.5)

## 2016-04-18 LAB — URINE MICROSCOPIC-ADD ON

## 2016-04-18 LAB — I-STAT CHEM 8, ED
BUN: 32 mg/dL — AB (ref 6–20)
CREATININE: 0.8 mg/dL (ref 0.44–1.00)
Calcium, Ion: 1.15 mmol/L (ref 1.13–1.30)
Chloride: 104 mmol/L (ref 101–111)
Glucose, Bld: 99 mg/dL (ref 65–99)
HEMATOCRIT: 31 % — AB (ref 36.0–46.0)
HEMOGLOBIN: 10.5 g/dL — AB (ref 12.0–15.0)
POTASSIUM: 4.8 mmol/L (ref 3.5–5.1)
SODIUM: 139 mmol/L (ref 135–145)
TCO2: 25 mmol/L (ref 0–100)

## 2016-04-18 LAB — I-STAT TROPONIN, ED: Troponin i, poc: 0.01 ng/mL (ref 0.00–0.08)

## 2016-04-18 LAB — DIFFERENTIAL
Basophils Absolute: 0 10*3/uL (ref 0.0–0.1)
Basophils Relative: 0 %
EOS PCT: 1 %
Eosinophils Absolute: 0 10*3/uL (ref 0.0–0.7)
LYMPHS ABS: 1.1 10*3/uL (ref 0.7–4.0)
LYMPHS PCT: 20 %
Monocytes Absolute: 0.3 10*3/uL (ref 0.1–1.0)
Monocytes Relative: 6 %
NEUTROS ABS: 3.9 10*3/uL (ref 1.7–7.7)
NEUTROS PCT: 73 %

## 2016-04-18 LAB — URINALYSIS, ROUTINE W REFLEX MICROSCOPIC
BILIRUBIN URINE: NEGATIVE
GLUCOSE, UA: NEGATIVE mg/dL
HGB URINE DIPSTICK: NEGATIVE
Ketones, ur: NEGATIVE mg/dL
Leukocytes, UA: NEGATIVE
Nitrite: NEGATIVE
PH: 5.5 (ref 5.0–8.0)
Protein, ur: 100 mg/dL — AB
SPECIFIC GRAVITY, URINE: 1.015 (ref 1.005–1.030)

## 2016-04-18 LAB — COMPREHENSIVE METABOLIC PANEL
ALBUMIN: 4.1 g/dL (ref 3.5–5.0)
ALK PHOS: 63 U/L (ref 38–126)
ALT: 10 U/L — AB (ref 14–54)
AST: 24 U/L (ref 15–41)
Anion gap: 8 (ref 5–15)
BILIRUBIN TOTAL: 0.5 mg/dL (ref 0.3–1.2)
BUN: 27 mg/dL — AB (ref 6–20)
CALCIUM: 9.3 mg/dL (ref 8.9–10.3)
CO2: 23 mmol/L (ref 22–32)
CREATININE: 0.88 mg/dL (ref 0.44–1.00)
Chloride: 105 mmol/L (ref 101–111)
GFR calc Af Amer: >60 mL/min (ref >60)
GFR calc non Af Amer: 60 mL/min — ABNORMAL LOW (ref >60)
GLUCOSE: 104 mg/dL — AB (ref 65–99)
Potassium: 4.7 mmol/L (ref 3.5–5.1)
SODIUM: 136 mmol/L (ref 135–145)
TOTAL PROTEIN: 8.1 g/dL (ref 6.5–8.1)

## 2016-04-18 LAB — ETHANOL: Alcohol, Ethyl (B): <5 mg/dL (ref <5)

## 2016-04-18 MED ORDER — SODIUM CHLORIDE 0.9 % IV BOLUS (SEPSIS)
1000.0000 mL | Freq: Once | INTRAVENOUS | Status: AC
  Administered 2016-04-18: 1000 mL via INTRAVENOUS

## 2016-04-18 MED ORDER — SODIUM CHLORIDE 0.9 % IV BOLUS (SEPSIS): 500.0000 mL | Freq: Once | INTRAVENOUS | Status: DC

## 2016-04-18 NOTE — Discharge Instructions (Signed)
Fatigue Fatigue is feeling tired all of the time, a lack of energy, or a lack of motivation. Occasional or mild fatigue is often a normal response to activity or life in general. However, long-lasting (chronic) or extreme fatigue may indicate an underlying medical condition. HOME CARE INSTRUCTIONS  Watch your fatigue for any changes. The following actions may help to lessen any discomfort you are feeling:  Talk to your health care provider about how much sleep you need each night. Try to get the required amount every night.  Take medicines only as directed by your health care provider.  Eat a healthy and nutritious diet. Ask your health care provider if you need help changing your diet.  Drink enough fluid to keep your urine clear or pale yellow.  Practice ways of relaxing, such as yoga, meditation, massage therapy, or acupuncture.  Exercise regularly.   Change situations that cause you stress. Try to keep your work and personal routine reasonable.  Do not abuse illegal drugs.  Limit alcohol intake to no more than 1 drink per day for nonpregnant women and 2 drinks per day for men. One drink equals 12 ounces of beer, 5 ounces of wine, or 1 ounces of hard liquor.  Take a multivitamin, if directed by your health care provider. SEEK MEDICAL CARE IF:   Your fatigue does not get better.  You have a fever.   You have unintentional weight loss or gain.  You have headaches.   You have difficulty:   Falling asleep.  Sleeping throughout the night.  You feel angry, guilty, anxious, or sad.   You are unable to have a bowel movement (constipation).   You skin is dry.   Your legs or another part of your body is swollen.  SEEK IMMEDIATE MEDICAL CARE IF:   You feel confused.   Your vision is blurry.  You feel faint or pass out.   You have a severe headache.   You have severe abdominal, pelvic, or back pain.   You have chest pain, shortness of breath, or an  irregular or fast heartbeat.   You are unable to urinate or you urinate less than normal.   You develop abnormal bleeding, such as bleeding from the rectum, vagina, nose, lungs, or nipples.  You vomit blood.   You have thoughts about harming yourself or committing suicide.   You are worried that you might harm someone else.    This information is not intended to replace advice given to you by your health care provider. Make sure you discuss any questions you have with your health care provider.   Document Released: 08/26/2007 Document Revised: 11/19/2014 Document Reviewed: 03/02/2014 Elsevier Interactive Patient Education 2016 [employer]  Outpatient Psychiatry and Counseling  [facility]: Mobile Crisis Management 24 hours:  [phone]  [facility] of the [employer] sliding scale fee and walk in schedule: M-F 8am-12pm/1pm-3pm [address]  [place], [place] [address] [phone]  [facility] [address] [place], [place] [address] [phone]  [facility] ([facility]ly [facility] [employer])- new patient walk-in appointments available Monday - Friday 8am -3pm.          [address] [place], [place] [address] [phone] or crisis line- [phone]  [facility] Outpatient Services/ Intensive Outpatient Therapy Program [address] [place], [place] [address] [phone]  [facility]                  Crisis Services  [phone]      [address]     [place], [place] [address]                 [facility]   [facility] [phone] [place], [place] [address]   [employer] of Care          [address]  [place], [place] [address]       [phone]  [facility] [address] [place], [place] [address] [phone]  [facility]    [address]    [place], [place] [address]     [phone]       Andee Poles, MD     [address]     [place] [address]     [phone]       [facility] [address] [place] [address]  [facility]     [address] [place], [place]      [phone]       [facility] Eulogio Ditch, MD [address] [place], [place] [address] [phone]  [facility]     [address]     [place], [place] [address]     [phone]       [facility] [address] [place], [place] [address] [phone] Resources for Temporary Residential Assistance/Crisis Centers

## 2016-04-18 NOTE — Telephone Encounter (Signed)
Patient Name: [name] DOB: January 13, 1933 Initial Comment Caller states her mother has had a TIA. She's having the some symptoms. Not speaking right. Confused. Nurse Assessment Nurse: Charna Elizabeth, RN, Cathy Date/Time (Eastern Time): 04/18/2016 12:29:34 PM Confirm and document reason for call. If symptomatic, describe symptoms. You must click the next button to save text entered. ---Caller states her mother had a TIA in 69. She developed some difficulty speaking and confusion this morning. No severe breathing difficulty. No injury in the past 3 days. Has the patient traveled out of the country within the last 30 days? ---No Does the patient have any new or worsening symptoms? ---Yes Will a triage be completed? ---Yes Related visit to physician within the last 2 weeks? ---No Does the PT have any chronic conditions? (i.e. diabetes, asthma, etc.) ---Yes List chronic conditions. ---TIA 2015, Cataract removed recently , Diabetes, cardiac Stent Is this a behavioral health or substance abuse call? ---No Guidelines Guideline Title Affirmed Question Affirmed Notes Neurologic Deficit Difficult to awaken or acting confused (e.g., disoriented, slurred speech) Final Disposition User Call EMS 911 Now [facility], RN, [employer] declined the Call 911 dispostion. Reinforced the Call 911 disposition. Linda plans to take her to the ER. Encouraged to call back as needed. Referrals [facility] - ED Disagree/Comply: Disagree Disagree/Comply Reason: Disagree with instructions

## 2016-04-18 NOTE — ED Notes (Signed)
Pt BIB daughter, states pt has been confused and weak since 1100 today. Pt hx TIA.
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
## 2016-04-19 LAB — URINE CULTURE: CULTURE: NO GROWTH

## 2016-04-24 ENCOUNTER — Ambulatory Visit: Payer: Commercial Managed Care - HMO | Admitting: Internal Medicine

## 2016-04-24 DIAGNOSIS — Z0289 Encounter for other administrative examinations: Secondary | ICD-10-CM

## 2016-05-01 ENCOUNTER — Ambulatory Visit (INDEPENDENT_AMBULATORY_CARE_PROVIDER_SITE_OTHER): Payer: Commercial Managed Care - HMO | Admitting: Internal Medicine

## 2016-05-01 ENCOUNTER — Encounter: Payer: Self-pay | Admitting: Internal Medicine

## 2016-05-01 VITALS — BP 180/78 | HR 67 | Wt 145.0 lb

## 2016-05-01 DIAGNOSIS — M353 Polymyalgia rheumatica: Secondary | ICD-10-CM | POA: Diagnosis not present

## 2016-05-01 DIAGNOSIS — R5382 Chronic fatigue, unspecified: Secondary | ICD-10-CM

## 2016-05-01 DIAGNOSIS — M25562 Pain in left knee: Secondary | ICD-10-CM | POA: Diagnosis not present

## 2016-05-01 DIAGNOSIS — G9332 Myalgic encephalomyelitis/chronic fatigue syndrome: Secondary | ICD-10-CM

## 2016-05-01 DIAGNOSIS — E538 Deficiency of other specified B group vitamins: Secondary | ICD-10-CM | POA: Diagnosis not present

## 2016-05-01 DIAGNOSIS — E1121 Type 2 diabetes mellitus with diabetic nephropathy: Secondary | ICD-10-CM | POA: Diagnosis not present

## 2016-05-01 DIAGNOSIS — N3941 Urge incontinence: Secondary | ICD-10-CM

## 2016-05-01 DIAGNOSIS — M25561 Pain in right knee: Secondary | ICD-10-CM

## 2016-05-01 MED ORDER — METHYLPREDNISOLONE ACETATE 40 MG/ML IJ SUSP
40.0000 mg | Freq: Once | INTRAMUSCULAR | Status: AC
  Administered 2016-05-01: 40 mg via INTRA_ARTICULAR

## 2016-05-01 MED ORDER — OXYBUTYNIN CHLORIDE 5 MG PO TABS: 5.0000 mg | ORAL_TABLET | Freq: Three times a day (TID) | ORAL | Status: DC

## 2016-05-01 MED ORDER — METHYLPREDNISOLONE ACETATE 80 MG/ML IJ SUSP: 80.0000 mg | Freq: Once | INTRAMUSCULAR | Status: DC

## 2016-05-01 MED ORDER — PREDNISONE 5 MG PO TABS: 5.0000 mg | ORAL_TABLET | Freq: Every day | ORAL | Status: DC

## 2016-05-01 NOTE — Progress Notes (Signed)
Pre visit review using our clinic review tool, if applicable. No additional management support is needed unless otherwise documented below in the visit note. 

## 2016-05-01 NOTE — Assessment & Plan Note (Signed)
Increase Oxybut tid to cover for night hours

## 2016-05-01 NOTE — Assessment & Plan Note (Signed)
6/17 not using Prednisone for?reason Risks associated with treatment noncompliance were discussed. Compliance was encouraged. IM Depomedrol

## 2016-05-01 NOTE — Assessment & Plan Note (Signed)
On B12 

## 2016-05-01 NOTE — Assessment & Plan Note (Signed)
On Metformin Lab Results  Component Value Date   HGBA1C 6.2 02/28/2016

## 2016-05-01 NOTE — Assessment & Plan Note (Signed)
6/17 not using Prednisone for?reason  Risks associated with treatment noncompliance were discussed. Compliance was encouraged.

## 2016-05-01 NOTE — Patient Instructions (Signed)
Postprocedure instructions :    A Band-Aid should be left on for 12 hours. Injection therapy is not a cure itself. It is used in conjunction with other modalities. You can use nonsteroidal anti-inflammatories like ibuprofen , hot and cold compresses. Rest is recommended in the next 24 hours. You need to report immediately  if fever, chills or any signs of infection develop. 

## 2016-05-01 NOTE — Assessment & Plan Note (Signed)
bursa anserina bursitis B flare-up See Procedure

## 2016-05-01 NOTE — Progress Notes (Signed)
Subjective:  Patient ID: [name], female    DOB: 21-Jan-1933  Age: 82 y.o. MRN: [id number]  CC: No chief complaint on file.   HPI [name] presents for PMR, fatigue, HTN f/u. C/o nocturia/urgency - worse. Not taking Prednisone for ?reason  Outpatient Prescriptions Prior to Visit  Medication Sig Dispense Refill  . ACCU-CHEK SMARTVIEW test strip every morning.    [name] acetaminophen (TYLENOL) 650 MG CR tablet Take 650 mg by mouth daily.    [name] aspirin EC 325 MG tablet Take 325 mg by mouth daily.    . carvedilol (COREG) 25 MG tablet TAKE 1 TABLET TWICE DAILY WITH A MEAL 180 tablet 3  . cholecalciferol (VITAMIN D) 1000 units tablet Take 1,000 Units by mouth daily.    . DUREZOL 0.05 % EMUL Place 1 drop into the left eye 2 (two) times daily.    [name] glucose blood (ONETOUCH VERIO) test strip Use as instructed 50 each 11  . hydrALAZINE (APRESOLINE) 50 MG tablet Take 1 tablet (50 mg total) by mouth daily. Reported on 11/29/2015 90 tablet 3  . metFORMIN (GLUCOPHAGE) 500 MG tablet Take 1 tablet (500 mg total) by mouth 2 (two) times daily with a meal. 180 tablet 3  . ONETOUCH DELICA LANCETS FINE MISC 1 Device by Does not apply route daily as needed. 100 each 3  . oxybutynin (DITROPAN) 5 MG tablet TAKE 1 TABLET TWICE DAILY 180 tablet 3  . vitamin B-12 (CYANOCOBALAMIN) 1000 MCG tablet Take 1,000 mcg by mouth daily.    . cefUROXime (CEFTIN) 250 MG tablet Take 1 tablet (250 mg total) by mouth 2 (two) times daily. (Patient not taking: Reported on 05/01/2016) 10 tablet 0   No facility-administered medications prior to visit.    ROS Review of Systems  Constitutional: Positive for fatigue. Negative for chills, activity change, appetite change and unexpected weight change.  HENT: Negative for congestion, mouth sores and sinus pressure.   Eyes: Negative for visual disturbance.  Respiratory: Negative for cough and chest tightness.   Gastrointestinal: Negative for nausea and abdominal pain.    Genitourinary: Negative for frequency, difficulty urinating and vaginal pain.  Musculoskeletal: Positive for back pain, arthralgias, gait problem, neck pain and neck stiffness.  Skin: Negative for pallor and rash.  Neurological: Positive for dizziness, weakness and light-headedness. Negative for tremors, numbness and headaches.  Psychiatric/Behavioral: Negative for confusion and sleep disturbance.    Objective:  BP 180/78 mmHg  Pulse 67  Wt 145 lb (65.772 kg)  SpO2 98%  BP Readings from Last 3 Encounters:  05/01/16 180/78  04/18/16 192/75  02/28/16 162/86    Wt Readings from Last 3 Encounters:  05/01/16 145 lb (65.772 kg)  02/28/16 143 lb (64.864 kg)  11/29/15 142 lb (64.411 kg)    Physical Exam  Constitutional: She appears well-developed. No distress.  HENT:  Head: Normocephalic.  Right Ear: External ear normal.  Left Ear: External ear normal.  Nose: Nose normal.  Mouth/Throat: Oropharynx is clear and moist.  Eyes: Conjunctivae are normal. Pupils are equal, round, and reactive to light. Right eye exhibits no discharge. Left eye exhibits no discharge.  Neck: Normal range of motion. Neck supple. No JVD present. No tracheal deviation present. No thyromegaly present.  Cardiovascular: Normal rate, regular rhythm and normal heart sounds.   Pulmonary/Chest: No stridor. No respiratory distress. She has no wheezes.  Abdominal: Soft. Bowel sounds are normal. She exhibits no distension and no mass. There is no tenderness. There is no  rebound and no guarding.  Musculoskeletal: She exhibits tenderness. She exhibits no edema.  Lymphadenopathy:    She has no cervical adenopathy.  Neurological: She displays normal reflexes. No cranial nerve deficit. She exhibits normal muscle tone. Coordination abnormal.  Skin: No rash noted. No erythema.  Psychiatric: She has a normal mood and affect. Her behavior is normal. Judgment and thought content normal.  Cane Slow ambulation Obese B knees  are tender    Procedure Note :    Procedure : Joint Injection, R and L knee   Procedure Note :    Procedure :Joint Injection,  Knee bursa anserina R,L   Indication: Bursitis with refractory  chronic pain.   Risks including unsuccessful procedure , bleeding, infection, bruising, skin atrophy and others were explained to the patient in detail as well as the benefits. Informed consent was obtained and signed.   The patient was placed in a comfortable position. Skin was prepped with Betadine and alcohol  and anesthetized with a cooling spray. Then, a 3 cc syringe with a 1.5 inch long 25-gauge needle was used for a bursa injection in a fan-like fasion with 3 mL of 2% lidocaine and 40 mg of Depo-Medrol - each knee .  Band-Aid was applied.   Tolerated well. Complications: None. Good pain relief following the procedure.   Postprocedure instructions :    A Band-Aid should be left on for 12 hours. Injection therapy is not a cure itself. It is used in conjunction with other modalities. You can use nonsteroidal anti-inflammatories like ibuprofen , hot and cold compresses. Rest is recommended in the next 24 hours. You need to report immediately  if fever, chills or any signs of infection develop.  Lab Results  Component Value Date   WBC 5.3 04/18/2016   HGB 10.5* 04/18/2016   HCT 31.0* 04/18/2016   PLT 257 04/18/2016   GLUCOSE 99 04/18/2016   CHOL 169 06/25/2015   TRIG 61 06/25/2015   HDL 57 06/25/2015   LDLDIRECT 114.8 01/10/2011   LDLCALC 100* 06/25/2015   ALT 10* 04/18/2016   AST 24 04/18/2016   NA 139 04/18/2016   K 4.8 04/18/2016   CL 104 04/18/2016   CREATININE 0.80 04/18/2016   BUN 32* 04/18/2016   CO2 23 04/18/2016   TSH 1.59 08/12/2015   INR 1.03 06/24/2015   HGBA1C 6.2 02/28/2016    No results found.  Assessment & Plan:   There are no diagnoses linked to this encounter. I have discontinued [name]'s cefUROXime. I am also having her maintain her vitamin  B-12, ACCU-CHEK SMARTVIEW, glucose blood, ONETOUCH DELICA LANCETS FINE, metFORMIN, hydrALAZINE, carvedilol, oxybutynin, aspirin EC, cholecalciferol, acetaminophen, and DUREZOL.  No orders of the defined types were placed in this encounter.     Follow-up: No Follow-up on file.  [name], MD

## 2016-05-01 NOTE — Addendum Note (Signed)
Addended by: Merrilyn Puma on: 05/01/2016 04:41 PM   Modules accepted: Orders

## 2016-06-03 IMAGING — DX DG FOOT 2V*R*
2 series · 2 of 2 positions shown · non-contrast
Comparison: None.

CLINICAL DATA: Dorsal right foot pain for 4 days, no known injury,
initial encounter

EXAM:
RIGHT FOOT - 2 VIEW

[foot ap]
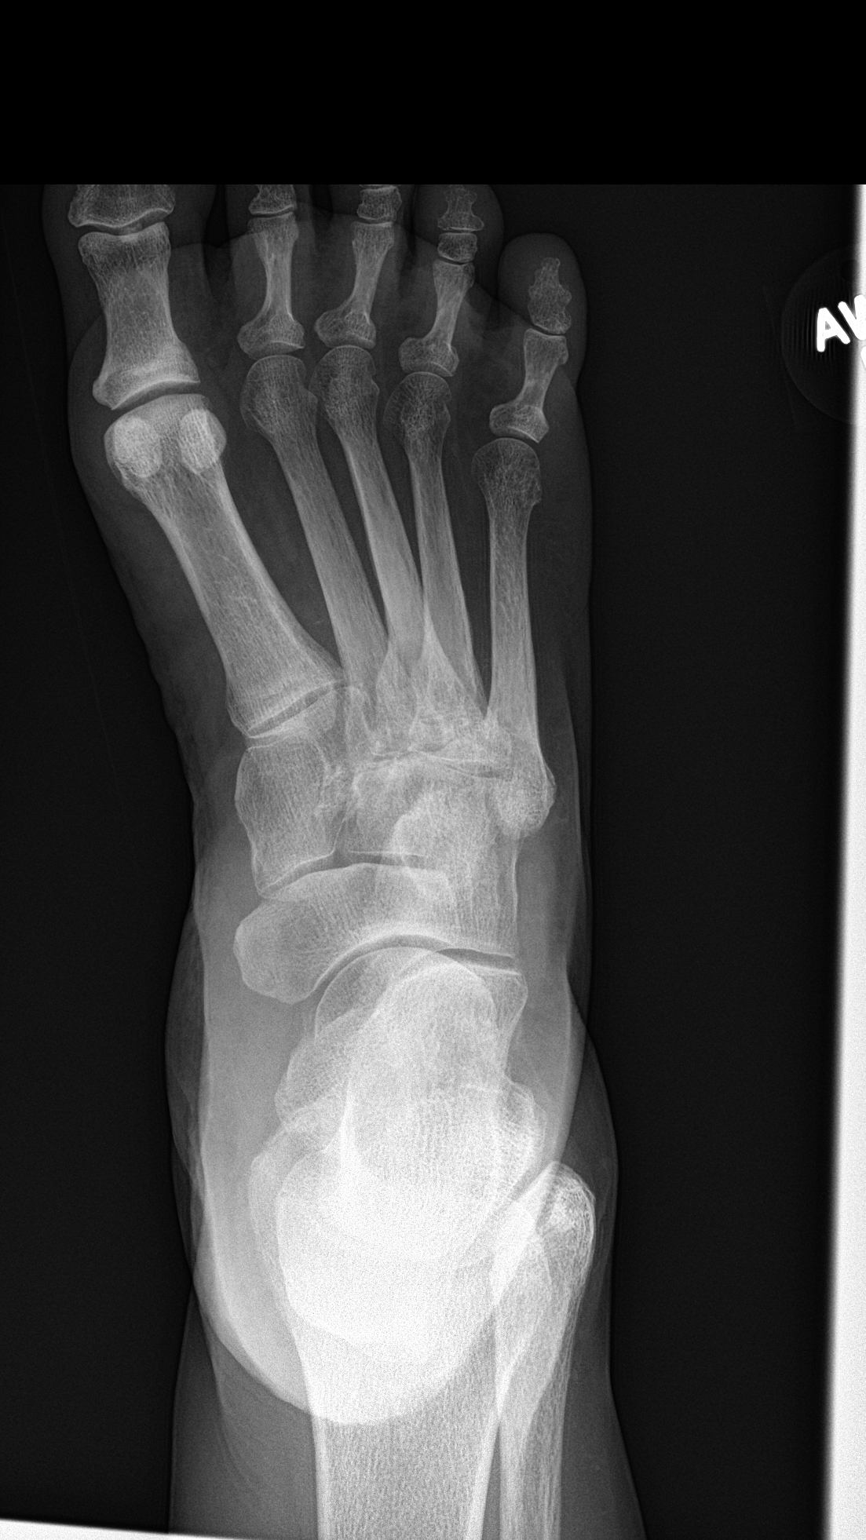

[foot lat]
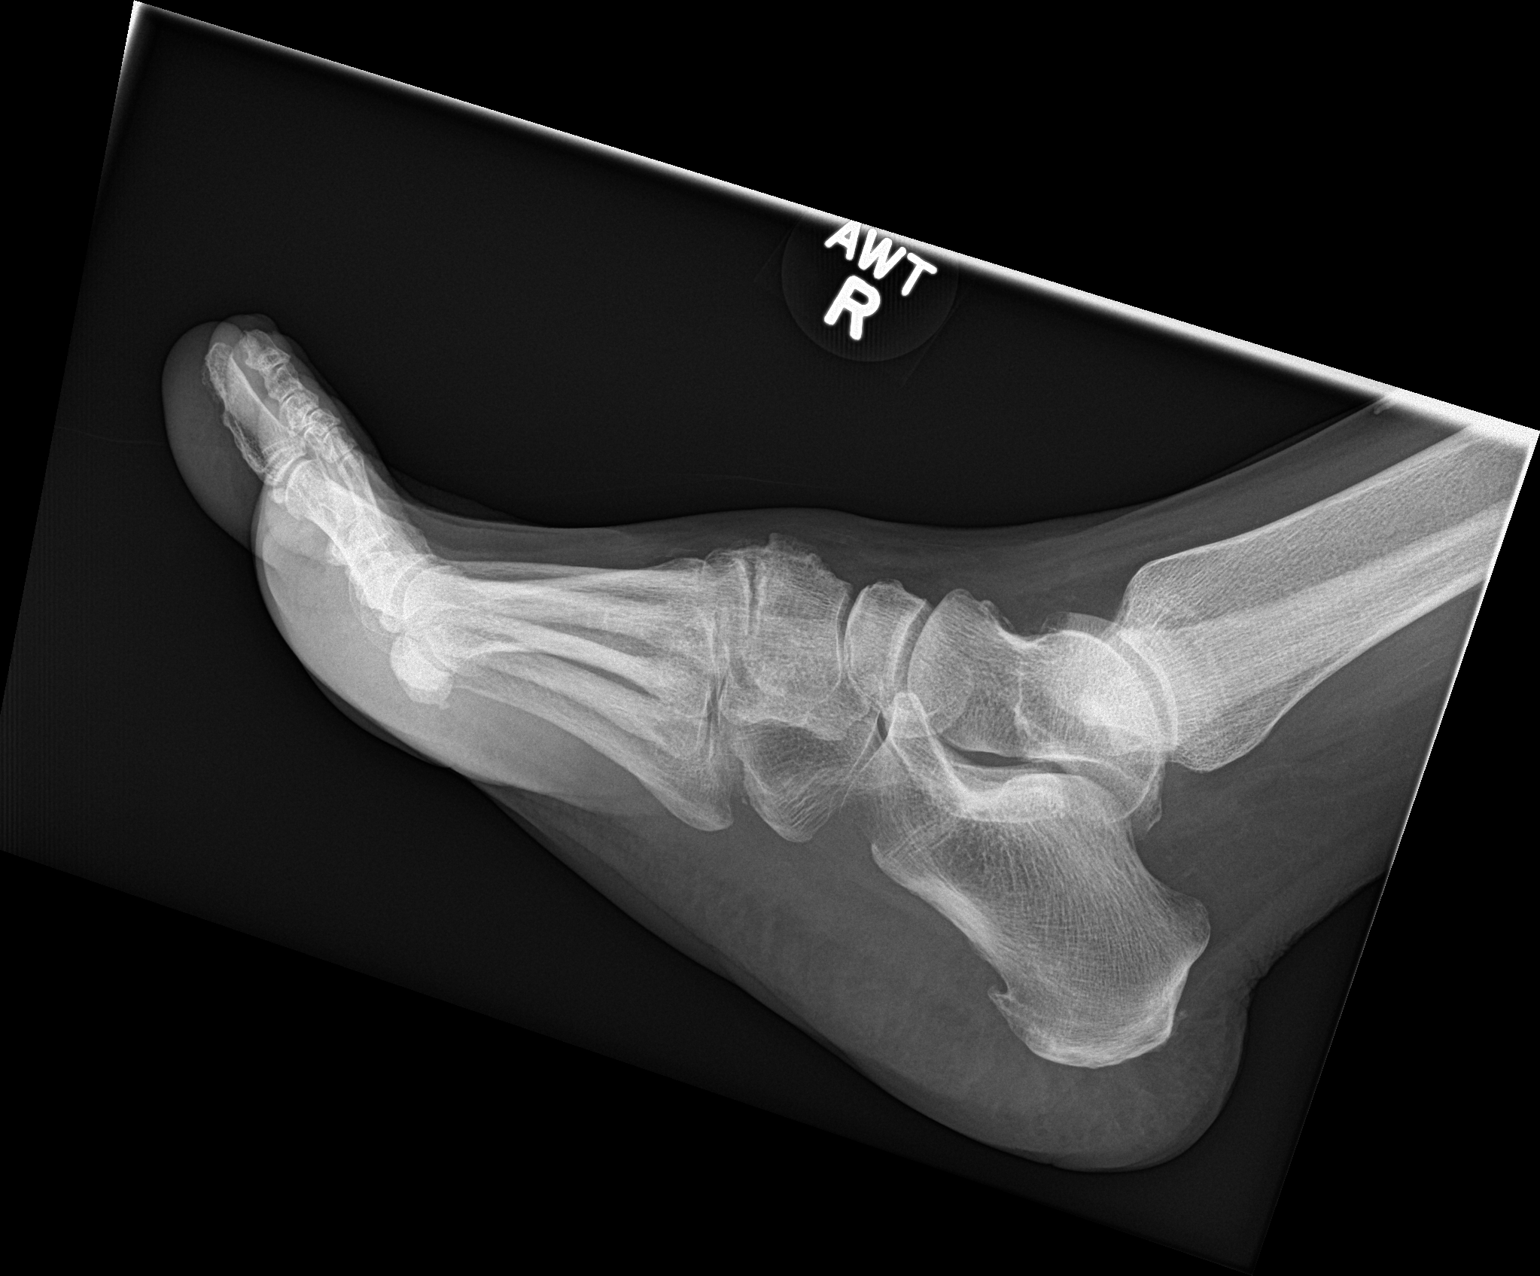

[2 of 2 positions shown; findings below may reference images not displayed]

FINDINGS: Degenerative changes are noted in the tarsal bones. No acute
fracture or dislocation is noted. A small calcaneal spur is noted.
IMPRESSION: Degenerative change without acute abnormality.

## 2016-06-04 IMAGING — CR DG ABD PORTABLE 1V
1 series · 1 of 1 positions shown · non-contrast
Comparison: 03/22/2015

CLINICAL DATA: Followup small bowel obstruction.

EXAM:
PORTABLE ABDOMEN - 1 VIEW

[ap (kub)]
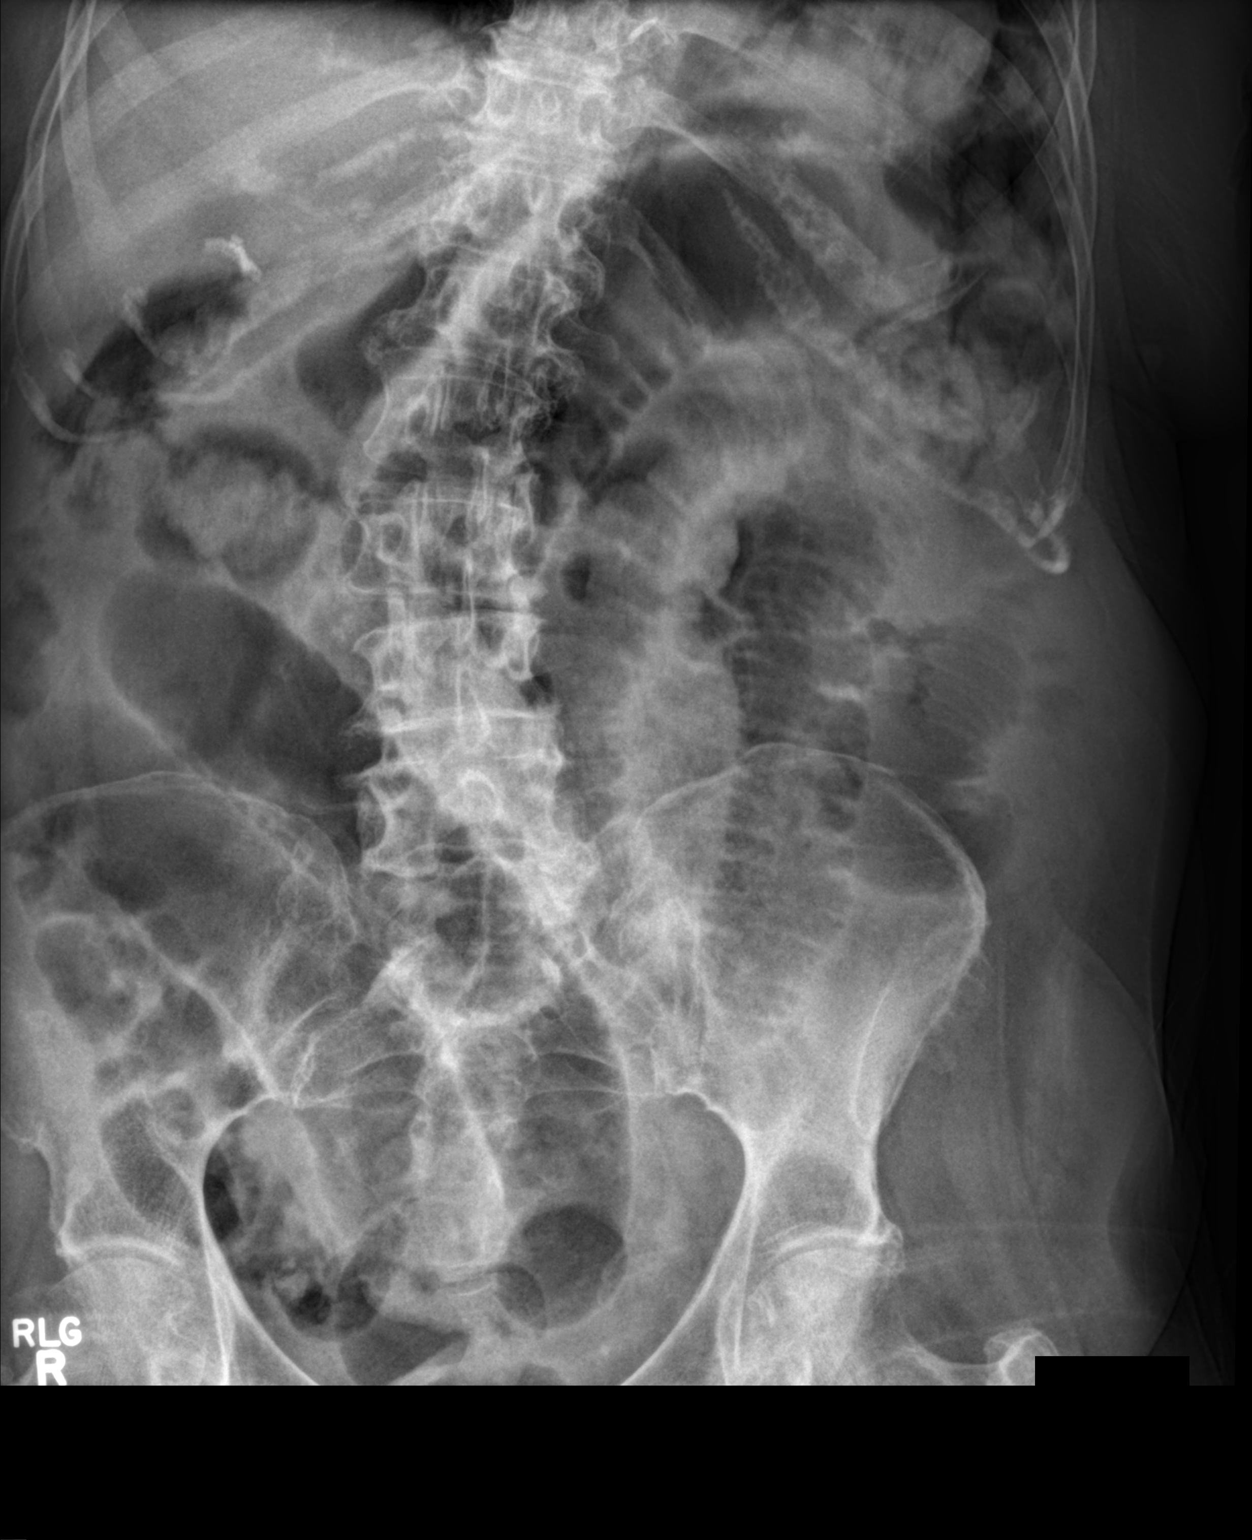

[1 of 1 positions shown; findings below may reference images not displayed]

FINDINGS: Dilated loops small bowel are similar to the prior exam allowing for
differences in radiographic technique different degrees of
magnification.

There is a small amount of air in a nondistended colon.
IMPRESSION: 1. Persistent high-grade partial small bowel obstruction. No
significant change from the previous day's study.

## 2016-06-06 IMAGING — US US RENAL
1 series · 14 of 25 positions shown · non-contrast
Comparison: None.

CLINICAL DATA: Acute renal failure.

EXAM:
RENAL / URINARY TRACT ULTRASOUND COMPLETE

[Series 1: us renal · 0.21mm/px · 14 of 26 slices shown]
[im 1/26]
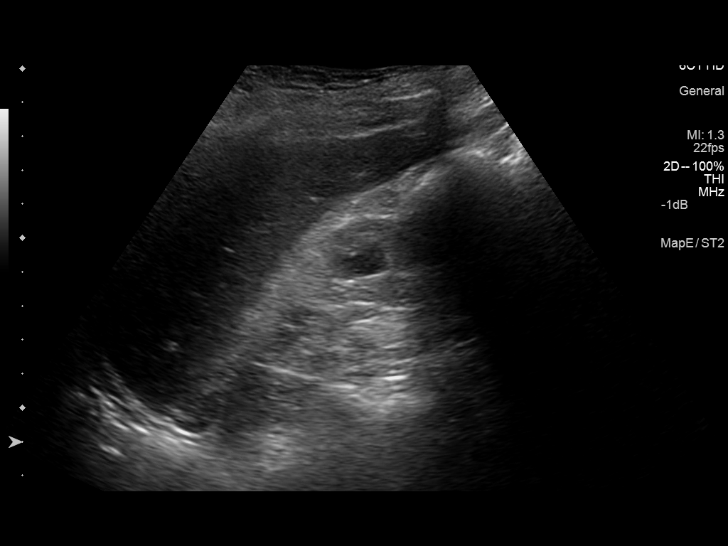
[im 3/26]
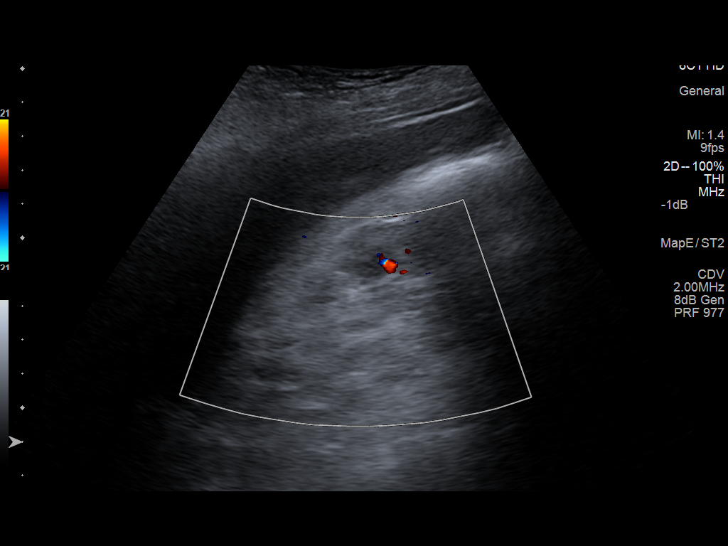
[im 5/26]
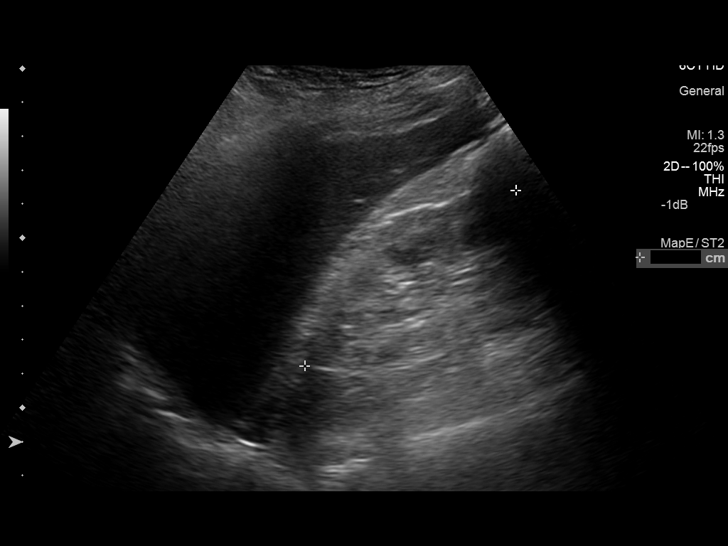
[im 7/26]
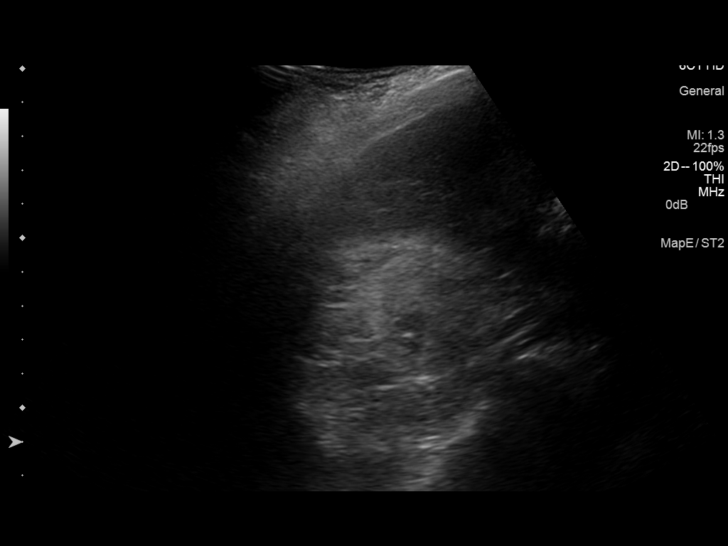
[im 9/26]
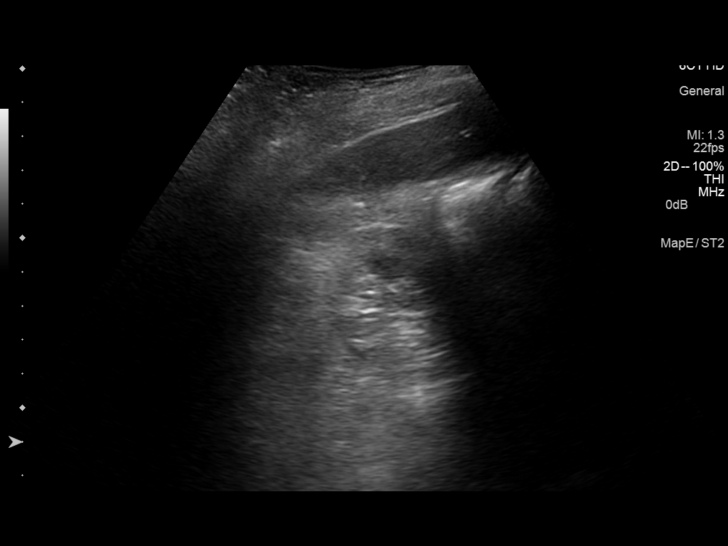
[im 10/26]
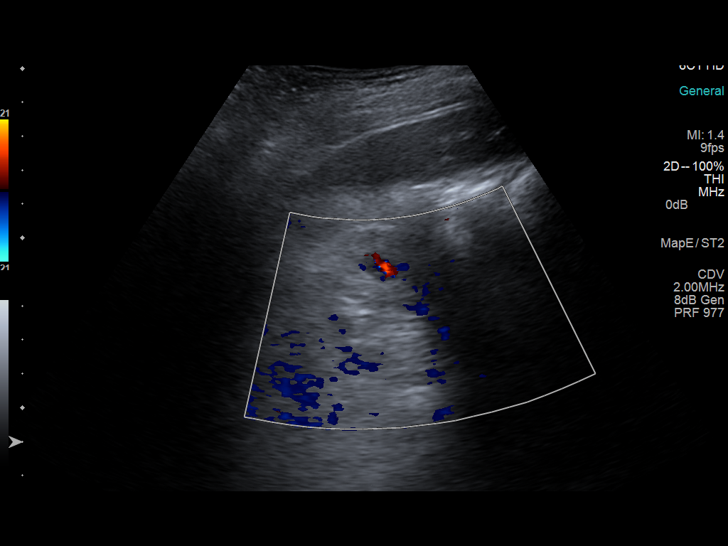
[im 12/26]
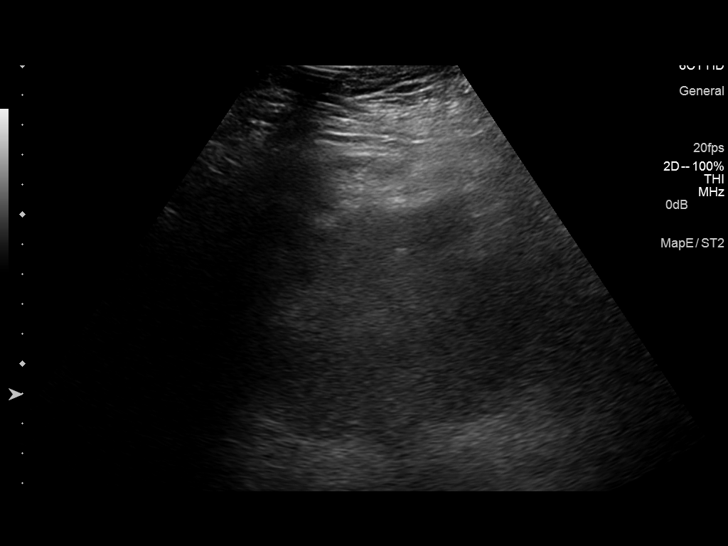
[im 14/26]
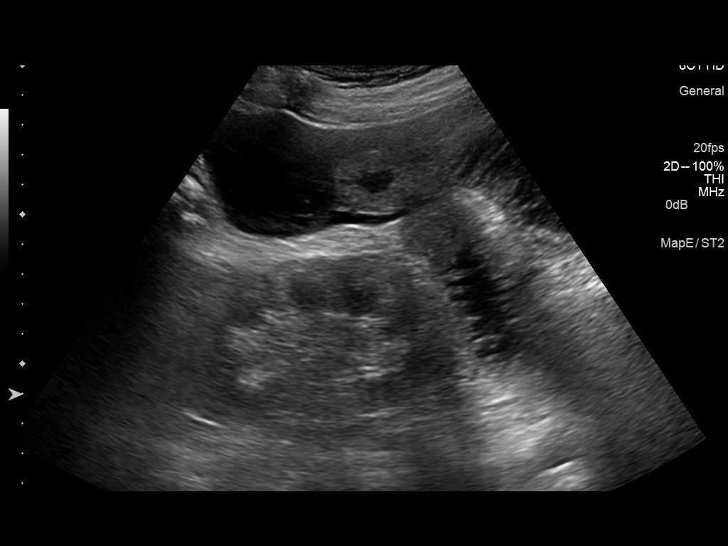
[im 16/26]
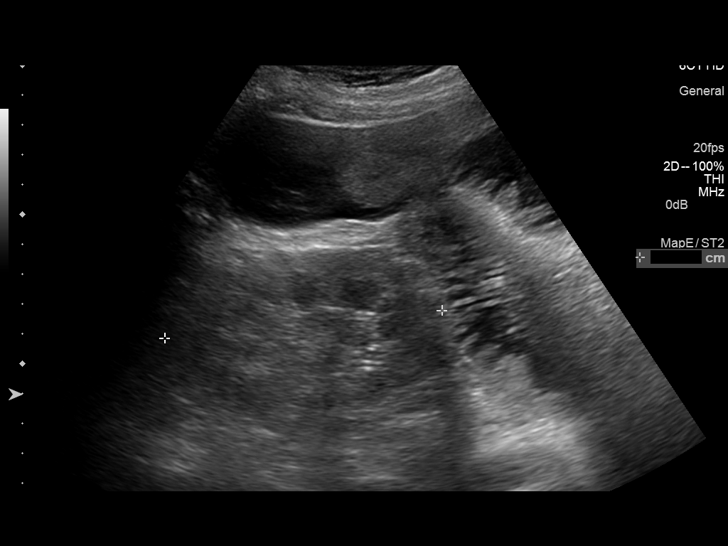
[im 17/26]
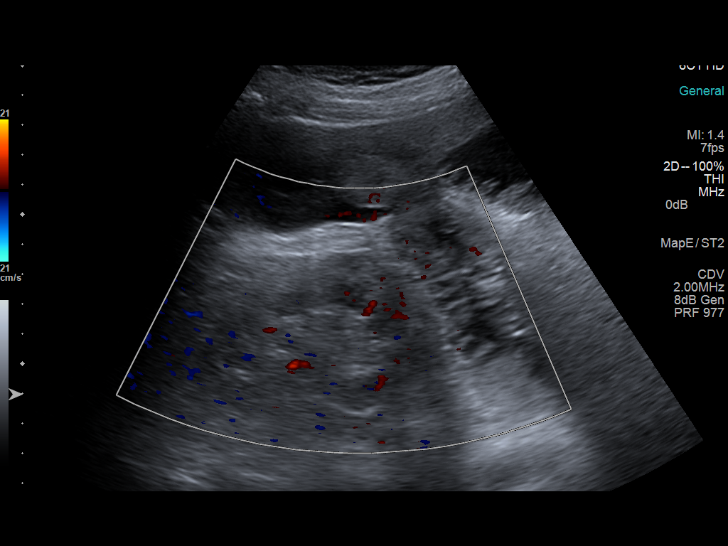
[im 19/26]
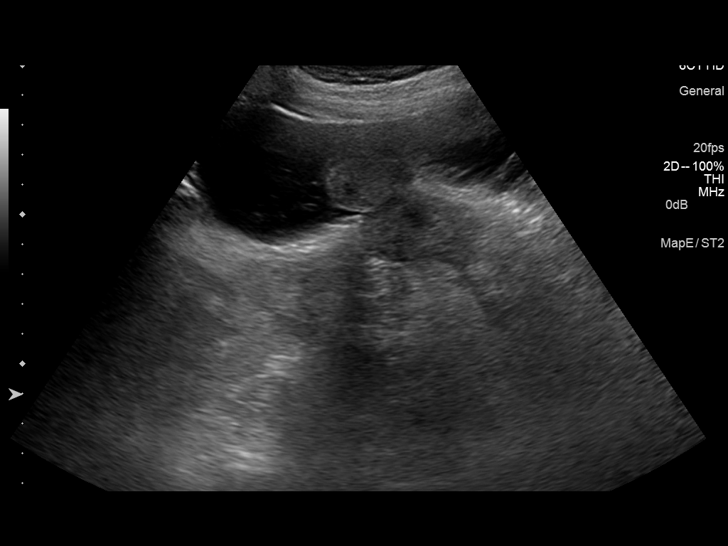
[im 21/26]
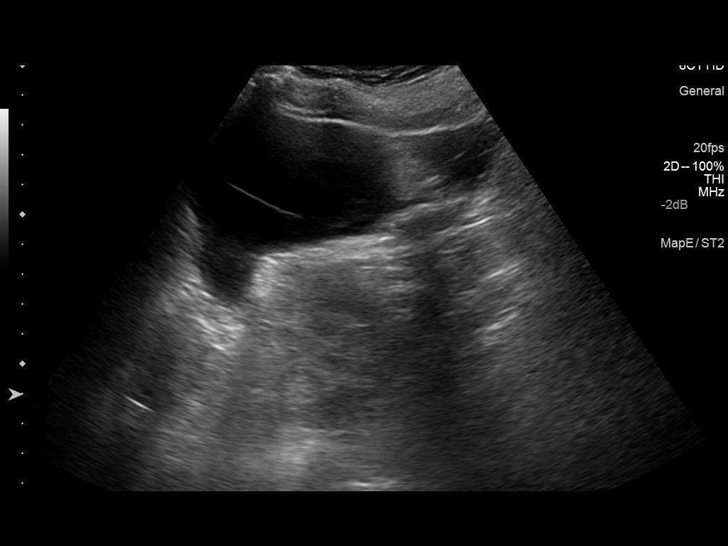
[im 23/26]
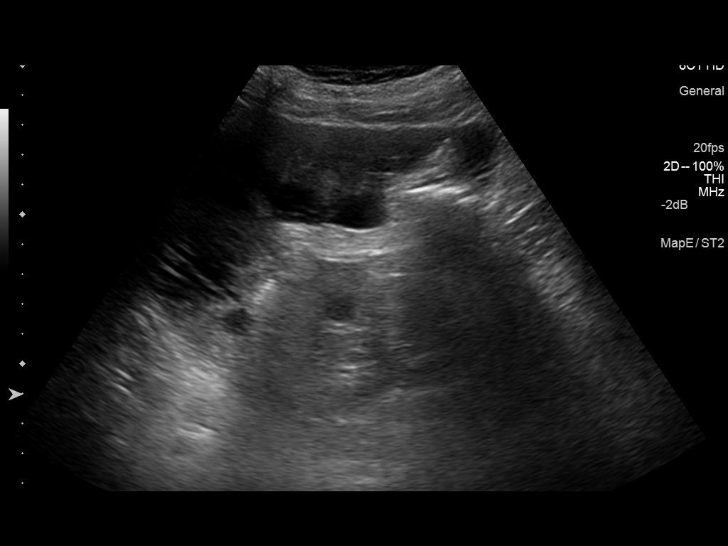
[im 26/26]
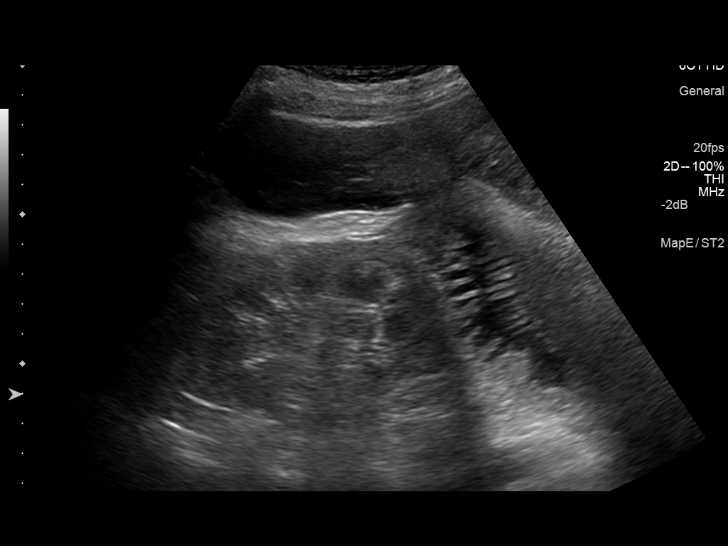

[14 of 25 positions shown; findings below may reference images not displayed]

FINDINGS: Right Kidney:

Length: 8.1 cm. Increased echogenicity consistent with chronic
medical renal disease. No mass or hydronephrosis visualized.

Left Kidney:

Length: 9.3 cm. Increased echogenicity consistent chronic medical
renal disease. No mass or hydronephrosis visualized.

Bladder:

Bladder decompressed by Foley catheter.
IMPRESSION: Bilateral echodense kidneys consistent chronic medical renal
disease. No acute abnormality. No hydronephrosis or bladder
distention.

## 2016-06-08 IMAGING — CR DG CHEST 1V PORT
1 series · 1 of 1 positions shown · non-contrast
Comparison: Chest radiograph performed 03/26/2015

CLINICAL DATA: Left PICC placement. Nausea and vomiting. Initial
encounter.

EXAM:
PORTABLE CHEST - 1 VIEW

[AP]
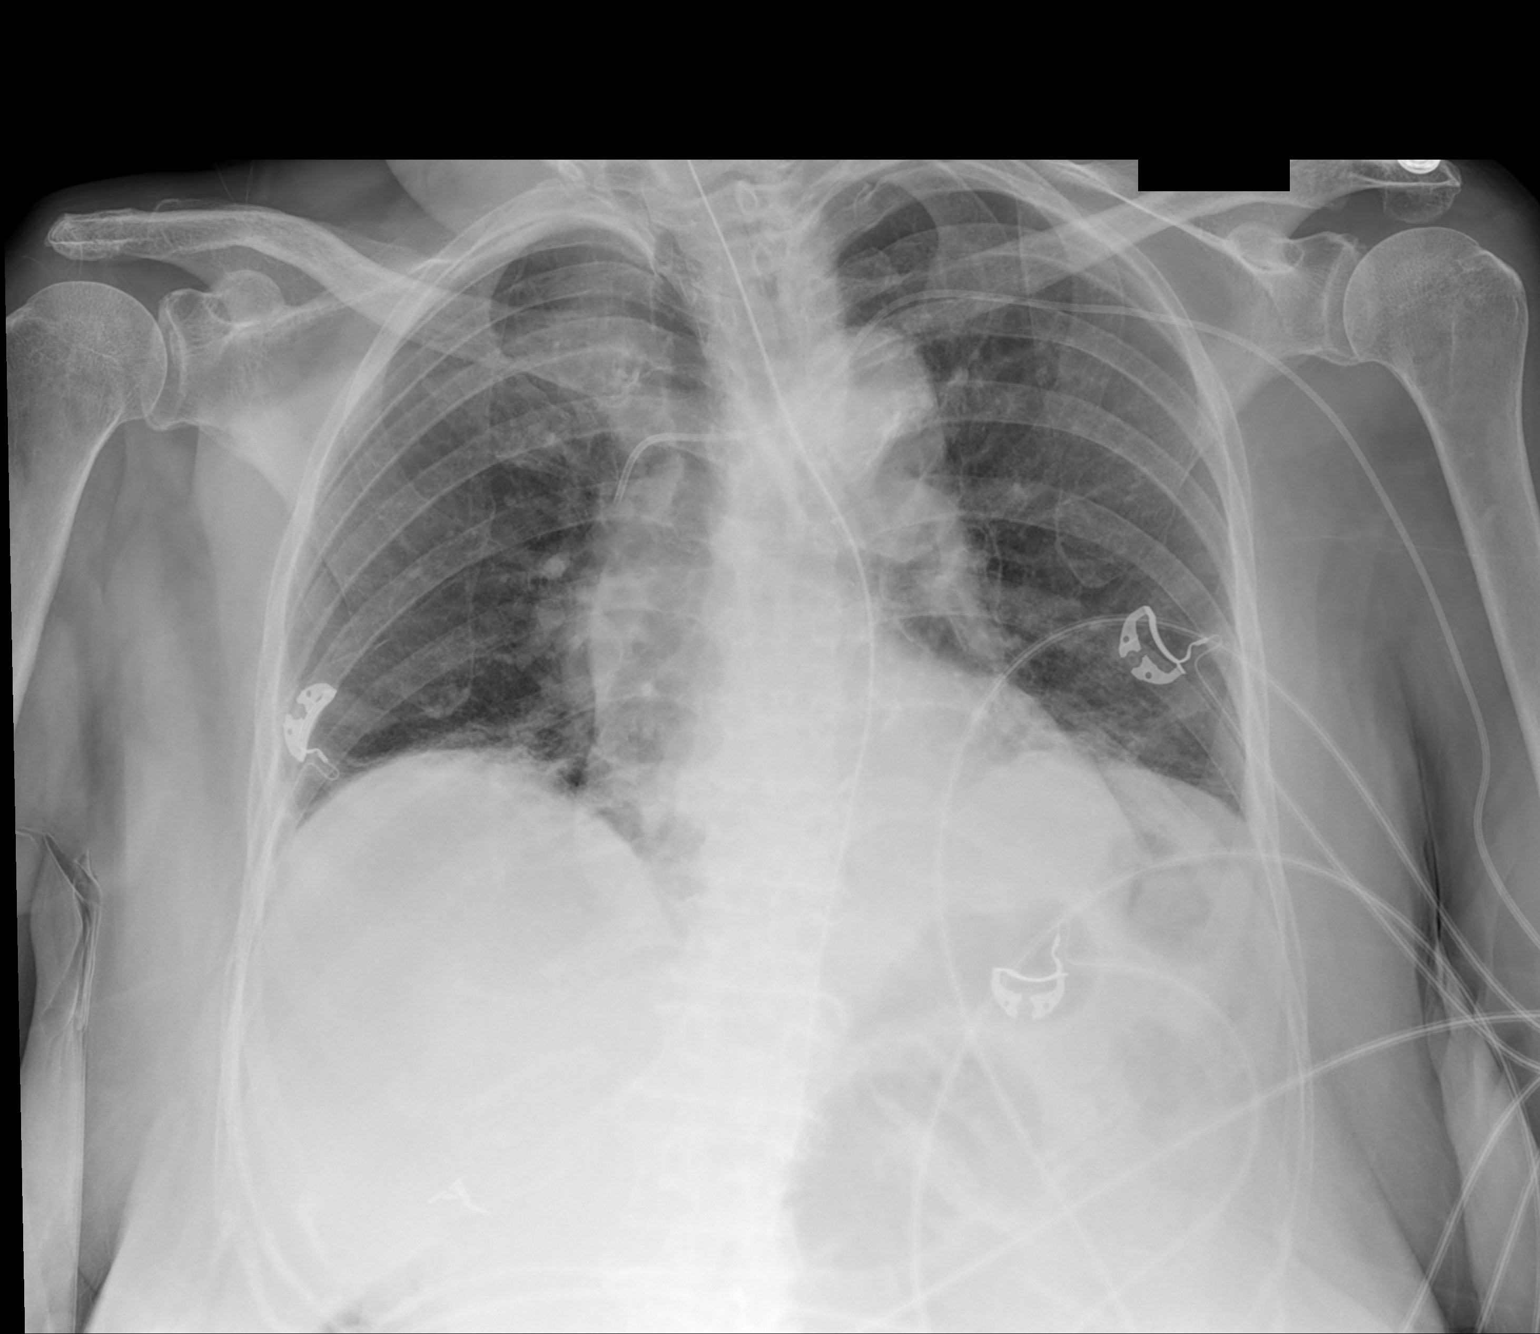

[1 of 1 positions shown; findings below may reference images not displayed]

FINDINGS: The patient's left PICC is noted ending about the proximal SVC. An
enteric tube is noted extending below the diaphragm.

The lungs are hypoexpanded. Bibasilar and right upper lung zone
airspace opacities may reflect atelectasis or pneumonia. No pleural
effusion or pneumothorax is seen

The cardiomediastinal silhouette is borderline normal in size. No
acute osseous abnormalities are identified.
IMPRESSION: 1. Left PICC noted ending about the proximal SVC.
2. Lungs hypoexpanded. Bibasilar and right upper lung zone airspace
opacities may reflect atelectasis or pneumonia.

These results were called by telephone at the time of interpretation
on 03/27/2015 at [DATE] to Nursing in the [HOSPITAL] ICU Stepdown,
who verbally acknowledged these results.

## 2016-06-14 ENCOUNTER — Encounter (HOSPITAL_COMMUNITY): Payer: Self-pay | Admitting: Emergency Medicine

## 2016-06-14 ENCOUNTER — Emergency Department (HOSPITAL_COMMUNITY)
Admission: EM | Admit: 2016-06-14 | Discharge: 2016-06-14 | Disposition: A | Discharge status: Home / Self Care | Payer: Commercial Managed Care - HMO | Attending: Emergency Medicine | Admitting: Emergency Medicine

## 2016-06-14 ENCOUNTER — Emergency Department (HOSPITAL_COMMUNITY): Payer: Commercial Managed Care - HMO

## 2016-06-14 DIAGNOSIS — Z79899 Other long term (current) drug therapy: Secondary | ICD-10-CM | POA: Diagnosis not present

## 2016-06-14 DIAGNOSIS — I509 Heart failure, unspecified: Secondary | ICD-10-CM | POA: Insufficient documentation

## 2016-06-14 DIAGNOSIS — Z7984 Long term (current) use of oral hypoglycemic drugs: Secondary | ICD-10-CM | POA: Insufficient documentation

## 2016-06-14 DIAGNOSIS — Z7982 Long term (current) use of aspirin: Secondary | ICD-10-CM | POA: Diagnosis not present

## 2016-06-14 DIAGNOSIS — R42 Dizziness and giddiness: Secondary | ICD-10-CM | POA: Diagnosis not present

## 2016-06-14 DIAGNOSIS — Z955 Presence of coronary angioplasty implant and graft: Secondary | ICD-10-CM | POA: Insufficient documentation

## 2016-06-14 DIAGNOSIS — I110 Hypertensive heart disease with heart failure: Secondary | ICD-10-CM | POA: Insufficient documentation

## 2016-06-14 DIAGNOSIS — E119 Type 2 diabetes mellitus without complications: Secondary | ICD-10-CM | POA: Insufficient documentation

## 2016-06-14 DIAGNOSIS — I2510 Atherosclerotic heart disease of native coronary artery without angina pectoris: Secondary | ICD-10-CM | POA: Diagnosis not present

## 2016-06-14 DIAGNOSIS — R112 Nausea with vomiting, unspecified: Secondary | ICD-10-CM | POA: Diagnosis not present

## 2016-06-14 DIAGNOSIS — R404 Transient alteration of awareness: Secondary | ICD-10-CM | POA: Diagnosis not present

## 2016-06-14 DIAGNOSIS — Z5181 Encounter for therapeutic drug level monitoring: Secondary | ICD-10-CM | POA: Diagnosis not present

## 2016-06-14 LAB — COMPREHENSIVE METABOLIC PANEL WITH GFR
ALT: 9 U/L — ABNORMAL LOW (ref 14–54)
AST: 16 U/L (ref 15–41)
Albumin: 4 g/dL (ref 3.5–5.0)
Alkaline Phosphatase: 55 U/L (ref 38–126)
Anion gap: 8 (ref 5–15)
BUN: 26 mg/dL — ABNORMAL HIGH (ref 6–20)
CO2: 23 mmol/L (ref 22–32)
Calcium: 9.1 mg/dL (ref 8.9–10.3)
Chloride: 106 mmol/L (ref 101–111)
Creatinine, Ser: 0.8 mg/dL (ref 0.44–1.00)
GFR calc Af Amer: >60 mL/min
GFR calc non Af Amer: >60 mL/min
Glucose, Bld: 85 mg/dL (ref 65–99)
Potassium: 4.3 mmol/L (ref 3.5–5.1)
Sodium: 137 mmol/L (ref 135–145)
Total Bilirubin: 0.5 mg/dL (ref 0.3–1.2)
Total Protein: 8.1 g/dL (ref 6.5–8.1)

## 2016-06-14 LAB — DIFFERENTIAL
BASOS ABS: 0 10*3/uL (ref 0.0–0.1)
Basophils Relative: 0 %
EOS ABS: 0.2 10*3/uL (ref 0.0–0.7)
EOS PCT: 3 %
LYMPHS ABS: 1.1 10*3/uL (ref 0.7–4.0)
Lymphocytes Relative: 18 %
MONOS PCT: 9 %
Monocytes Absolute: 0.5 10*3/uL (ref 0.1–1.0)
Neutro Abs: 4.3 10*3/uL (ref 1.7–7.7)
Neutrophils Relative %: 70 %

## 2016-06-14 LAB — URINE MICROSCOPIC-ADD ON: RBC / HPF: NONE SEEN RBC/hpf (ref 0–5)

## 2016-06-14 LAB — RAPID URINE DRUG SCREEN, HOSP PERFORMED
AMPHETAMINES: NOT DETECTED
BENZODIAZEPINES: NOT DETECTED
Barbiturates: NOT DETECTED
Cocaine: NOT DETECTED
OPIATES: NOT DETECTED
Tetrahydrocannabinol: NOT DETECTED

## 2016-06-14 LAB — CBC
HEMATOCRIT: 29.6 % — AB (ref 36.0–46.0)
Hemoglobin: 9.5 g/dL — ABNORMAL LOW (ref 12.0–15.0)
MCH: 30.2 pg (ref 26.0–34.0)
MCHC: 32.1 g/dL (ref 30.0–36.0)
MCV: 94 fL (ref 78.0–100.0)
Platelets: 260 10*3/uL (ref 150–400)
RBC: 3.15 MIL/uL — ABNORMAL LOW (ref 3.87–5.11)
RDW: 13.1 % (ref 11.5–15.5)
WBC: 6.1 10*3/uL (ref 4.0–10.5)

## 2016-06-14 LAB — URINALYSIS, ROUTINE W REFLEX MICROSCOPIC
BILIRUBIN URINE: NEGATIVE
GLUCOSE, UA: NEGATIVE mg/dL
HGB URINE DIPSTICK: NEGATIVE
KETONES UR: NEGATIVE mg/dL
Leukocytes, UA: NEGATIVE
Nitrite: NEGATIVE
PH: 6 (ref 5.0–8.0)
Protein, ur: 100 mg/dL — AB
Specific Gravity, Urine: 1.011 (ref 1.005–1.030)

## 2016-06-14 LAB — APTT: aPTT: 61 seconds — ABNORMAL HIGH (ref 24–36)

## 2016-06-14 LAB — PROTIME-INR
INR: 1.04
Prothrombin Time: 13.7 seconds (ref 11.4–15.2)

## 2016-06-14 LAB — ETHANOL: Alcohol, Ethyl (B): <5 mg/dL (ref <5)

## 2016-06-14 LAB — I-STAT TROPONIN, ED: Troponin i, poc: 0.01 ng/mL (ref 0.00–0.08)

## 2016-06-14 LAB — LIPASE, BLOOD: LIPASE: 26 U/L (ref 11–51)

## 2016-06-14 MED ORDER — SODIUM CHLORIDE 0.9 % IV BOLUS (SEPSIS)
500.0000 mL | Freq: Once | INTRAVENOUS | Status: AC
  Administered 2016-06-14: 500 mL via INTRAVENOUS

## 2016-06-14 MED ORDER — MECLIZINE HCL 25 MG PO TABS: 12.5000 mg | ORAL_TABLET | Freq: Three times a day (TID) | ORAL | 0 refills | Status: DC | PRN

## 2016-06-14 MED ORDER — MECLIZINE HCL 25 MG PO TABS
25.0000 mg | ORAL_TABLET | Freq: Once | ORAL | Status: AC
  Administered 2016-06-14: 25 mg via ORAL
  Filled 2016-06-14: qty 1

## 2016-06-14 NOTE — ED Notes (Signed)
Family at bedside. 

## 2016-06-14 NOTE — ED Notes (Signed)
In MRI

## 2016-06-14 NOTE — ED Notes (Signed)
MRI called, tx pt to MRI about 1315

## 2016-06-14 NOTE — ED Notes (Signed)
Requested a urine sample. 

## 2016-06-14 NOTE — ED Notes (Signed)
[name] w/ EMS dispatch spoke w/ [facility] and reports that "all medications were left with family."

## 2016-06-14 NOTE — ED Notes (Signed)
RN starting IV 

## 2016-06-14 NOTE — ED Notes (Signed)
Off floor for testing 

## 2016-06-14 NOTE — ED Notes (Signed)
Spoke w/ EMS dispatch and informed that [facility] transported the Pt.  Dispatch is contacting unit regarding medications.

## 2016-06-14 NOTE — ED Provider Notes (Signed)
[facility]-EMERGENCY DEPT Provider Note   CSN: [id number] Arrival date & time: 06/14/16  1014  First Provider Contact:  First MD Initiated Contact with Patient 06/14/16 1029        History   Chief Complaint Chief Complaint  Patient presents with  . Emesis    HPI [name] is a 82 y.o. female presenting with dizziness and vomiting. Patient states that she woke up a little before 8 AM and went to the bathroom. When she came back from the bathroom and sat down she then stood back up to go get her medicines. At that point she felt a room spinning sensation and had to sit back down. She did not fall. She threw up once which greatly improved her symptoms. She is now significantly better but still feels a little bit dizzy. At the time of severe dizziness she had blurred vision but no current visual disturbance. There has never been a headache. No longer currently nauseated. Never had focal weakness or numbness but felt like she couldn't walk. Has had vertigo but has not had an episode in quite a while and does not have current medicines. She's not sure if this is like prior vertigo episodes. Has not been having neck pain, chest pain, shortness of breath, abdominal pain, back pain. Her urine has been "foamy" but no dysuria. This is also chronic. Patient has chronically been fatigued and over the last couple days has not been feeling well. When asked to describe why she states that she's had some loose stools but otherwise can't put her finger on why she doesn't quite feel well. She notes a constant humming in her left ear for years but no recent change.  HPI  Past Medical History:  Diagnosis Date  . Anemia    iron deficiency  . Aneurysm, thoracic aortic (HCC)   . Anxiety   . B12 deficiency   . CAD (coronary artery disease)    s/p stenting of LAD 1999- cath 5-08 EF normal LAD 30-40% restenosis. D1 50% D2 80% LCX & RCA minimal plaque  . Chronic back pain   . Constipation   . Depression   .  Diabetes mellitus   . DVT (deep venous thrombosis) (HCC)   . GERD (gastroesophageal reflux disease)   . Gout   . HTN (hypertension)   . Hyperlipemia   . Obesity   . Osteoporosis   . Pancreatitis   . Polyarthritis    DJD/ possible PMR  . Renal insufficiency    Cr 1.2-1.3  . Tinnitus   . Urinary frequency   . Vertigo   . Vitamin D deficiency     Patient Active Problem List   Diagnosis Date Noted  . UTI (urinary tract infection) 02/28/2016  . Urinary incontinence 10/28/2015  . Alopecia 08/12/2015  . TIA (transient ischemic attack) 07/01/2015  . Acute encephalopathy 06/24/2015  . Protein-calorie malnutrition, severe (HCC) 04/19/2015  . Fever   . Abdominal abscess (HCC)   . Pressure ulcer 04/07/2015  . AKI (acute kidney injury) (HCC)   . Sepsis (HCC)   . Stroke (HCC)   . Facial droop   . Malnutrition of moderate degree (HCC) 03/20/2015  . Small bowel obstruction s/p exlap/LOA/decompression 03/25/2015 03/18/2015  . Essential hypertension 03/18/2015  . Noncompliance w/medication treatment due to intermit use of medication 12/07/2014  . Fall against object 06/24/2014  . Contusion of left hand 06/24/2014  . Contusion of left hip 06/24/2014  . Loss of weight 04/12/2014  . Malignant  hypertension with renal failure and congestive heart failure (HCC) 09/23/2013  . Knee pain, bilateral 07/27/2013  . Chronic fatigue disorder 01/19/2013  . Vaginitis due to Candida 09/02/2012  . Sinusitis 09/02/2012  . Anemia 09/02/2012  . Polymyalgia rheumatica (HCC) 09/10/2011  . Vertigo 08/21/2011  . Fatigue 05/11/2011  . ANEMIA OF CHRONIC DISEASE 09/06/2010  . KNEE PAIN, CHRONIC 05/26/2010  . DIZZINESS 05/11/2010  . SHOULDER PAIN 04/25/2010  . FREQUENCY, URINARY 03/16/2010  . CONSTIPATION, CHRONIC 01/17/2010  . FOOT PAIN 01/17/2009  . RASH AND OTHER NONSPECIFIC SKIN ERUPTION 01/17/2009  . TINNITUS NOS 10/18/2008  . B12 deficiency 08/10/2008  . LOW BACK PAIN 06/23/2008  . CHEST WALL  PAIN 06/23/2008  . DYSURIA 03/18/2008  . Abdominal pain 03/18/2008  . HYPERLIPIDEMIA 12/24/2007  . DYSPNEA 12/24/2007  . Anxiety state 12/07/2007  . GERD 12/07/2007  . CHEST PAIN 12/07/2007  . OTHER SPEC FORMS CHRONIC ISCHEMIC HEART DISEASE 12/01/2007  . Diabetes type 2, controlled (HCC) 11/28/2007  . Pain in joint 11/28/2007  . GOUT 09/17/2007  . Adjustment disorder with mixed anxiety and depressed mood 09/17/2007  . Coronary atherosclerosis 09/17/2007  . Disorder resulting from impaired renal function 09/17/2007  . OSTEOPOROSIS 09/17/2007  . DVT, HX OF 09/17/2007  . PANCREATITIS, HX OF 09/17/2007    Past Surgical History:  Procedure Laterality Date  . ABDOMINAL HYSTERECTOMY    . CARDIAC CATHETERIZATION  2008   L main 20%, LAD stent patent, D1 50%, D2 80% (small), RCA 20%, EF 55-60%  . CHOLECYSTECTOMY    . CORONARY ANGIOPLASTY WITH STENT PLACEMENT  1999   LAD stent  . HEMORRHOID SURGERY    . LAPAROSCOPIC LYSIS OF ADHESIONS N/A 03/24/2015   Procedure: LAPAROSCOPIC LYSIS OF ADHESIONS;  Surgeon: Luretha Murphy, MD;  Location: [facility];  Service: General;  Laterality: N/A;  . LAPAROSCOPY N/A 03/24/2015   Procedure: LAPAROSCOPY DIAGNOSTIC;  Surgeon: Luretha Murphy, MD;  Location: [facility];  Service: General;  Laterality: N/A;  . LAPAROTOMY N/A 03/24/2015   Procedure: LAPAROTOMY with decompression of bowel;  Surgeon: Luretha Murphy, MD;  Location: [facility];  Service: General;  Laterality: N/A;  . TUBAL LIGATION      OB History    No data available       Home Medications    Prior to Admission medications   Medication Sig Start Date End Date Taking? Authorizing Provider  carvedilol (COREG) 25 MG tablet TAKE 1 TABLET TWICE DAILY WITH A MEAL Patient taking differently: Take 25 mg by mouth 2 (two) times daily with a meal.  11/29/15  Yes [name] Plotnikov, MD  hydrALAZINE (APRESOLINE) 50 MG tablet Take 1 tablet (50 mg total) by mouth daily. Reported on 11/29/2015 11/29/15  Yes Tresa Garter, MD  [facility] DELICA LANCETS FINE MISC 1 Device by Does not apply route daily as needed. 08/12/15  Yes Tresa Garter, MD  ACCU-CHEK SMARTVIEW test strip every morning. 06/07/15   Historical Provider, MD  acetaminophen (TYLENOL) 650 MG CR tablet Take 650 mg by mouth daily.    Historical Provider, MD  aspirin EC 325 MG tablet Take 325 mg by mouth daily.    Historical Provider, MD  cholecalciferol (VITAMIN D) 1000 units tablet Take 1,000 Units by mouth daily.    Historical Provider, MD  DUREZOL 0.05 % EMUL Place 1 drop into the left eye 2 (two) times daily. 04/09/16   Historical Provider, MD  glucose blood (ONETOUCH VERIO) test strip Use as instructed 08/12/15   Macarthur Critchley  V Plotnikov, MD  meclizine (ANTIVERT) 25 MG tablet Take 0.5-1 tablets (12.5-25 mg total) by mouth 3 (three) times daily as needed for dizziness. 06/14/16   Pricilla Loveless, MD  metFORMIN (GLUCOPHAGE) 500 MG tablet Take 1 tablet (500 mg total) by mouth 2 (two) times daily with a meal. 10/10/15   [name] Plotnikov, MD  oxybutynin (DITROPAN) 5 MG tablet Take 1 tablet (5 mg total) by mouth 3 (three) times daily. 05/01/16   [name] Plotnikov, MD  predniSONE (DELTASONE) 5 MG tablet Take 1 tablet (5 mg total) by mouth daily with breakfast. 05/01/16   Tresa Garter, MD  vitamin B-12 (CYANOCOBALAMIN) 1000 MCG tablet Take 1,000 mcg by mouth daily.    Historical Provider, MD    Family History Family History  Problem Relation Age of Onset  . Adopted: Yes  . Diabetes Mother   . Hypertension Father     Social History Social History  Substance Use Topics  . Smoking status: Never Smoker  . Smokeless tobacco: Not on file  . Alcohol use No     Allergies   Amlodipine besylate; Atenolol; Benazepril; Benicar [olmesartan medoxomil]; Cozaar; Hydralazine; Hydrochlorothiazide w-triamterene; Hydrocodone; Hydroxyzine pamoate; Iodine; Lisinopril; Peach flavor; Penicillins; Pravastatin; Prednisolone; Strawberry flavor; Tramadol hcl;  and Benadryl [diphenhydramine]   Review of Systems Review of Systems  Constitutional: Negative for fever.  Respiratory: Negative for shortness of breath.   Cardiovascular: Negative for chest pain.  Gastrointestinal: Positive for nausea and vomiting. Negative for abdominal pain and diarrhea (loose stools but not increased frequency).  Genitourinary: Negative for dysuria and frequency.  Musculoskeletal: Negative for back pain and neck pain.  Neurological: Positive for dizziness. Negative for syncope, speech difficulty, weakness, numbness and headaches.  All other systems reviewed and are negative.    Physical Exam Updated Vital Signs BP 192/69   Pulse 65   Temp 98.3 F (36.8 C)   Resp 16   Ht 5\' 5"  (1.651 m)   Wt 142 lb (64.4 kg)   SpO2 96%   BMI 23.63 kg/m   Physical Exam  Constitutional: She is oriented to person, place, and time. She appears well-developed and well-nourished.  HENT:  Head: Normocephalic and atraumatic.  Right Ear: Tympanic membrane and external ear normal.  Left Ear: Tympanic membrane and external ear normal.  Nose: Nose normal.  Eyes: EOM are normal. Pupils are equal, round, and reactive to light. Right eye exhibits no discharge. Left eye exhibits no discharge.  Mild right beating nystagmus  Neck: Neck supple.  Cardiovascular: Normal rate, regular rhythm and normal heart sounds.   Pulmonary/Chest: Effort normal and breath sounds normal.  Abdominal: Soft. There is no tenderness.  Neurological: She is alert and oriented to person, place, and time.  CN 3-12 grossly intact. 5/5 strength in all 4 extremities. Grossly normal sensation. Normal finger to nose.   Skin: Skin is warm and dry.  Nursing note and vitals reviewed.    ED Treatments / Results  Labs (all labs ordered are listed, but only abnormal results are displayed) Labs Reviewed  COMPREHENSIVE METABOLIC PANEL - Abnormal; Notable for the following:       Result Value   BUN 26 (*)    ALT 9  (*)    All other components within normal limits  CBC - Abnormal; Notable for the following:    RBC 3.15 (*)    Hemoglobin 9.5 (*)    HCT 29.6 (*)    All other components within normal limits  URINALYSIS, ROUTINE  W REFLEX MICROSCOPIC (NOT AT [facility]) - Abnormal; Notable for the following:    Protein, ur 100 (*)    All other components within normal limits  APTT - Abnormal; Notable for the following:    aPTT 61 (*)    All other components within normal limits  URINE MICROSCOPIC-ADD ON - Abnormal; Notable for the following:    Squamous Epithelial / LPF 0-5 (*)    Bacteria, UA RARE (*)    All other components within normal limits  LIPASE, BLOOD  ETHANOL  PROTIME-INR  URINE RAPID DRUG SCREEN, HOSP PERFORMED  DIFFERENTIAL  I-STAT TROPOININ, ED    EKG  EKG Interpretation  Date/Time:  Thursday June 14 2016 11:31:09 EDT Ventricular Rate:  54 PR Interval:    QRS Duration: 132 QT Interval:  419 QTC Calculation: 397 R Axis:   88 Text Interpretation:  Sinus bradycardia Right bundle branch block RBBB appears new compared to June 2017 Confirmed by Criss Alvine MD, Jacen Carlini [phone]) on 06/14/2016 11:42:46 AM       Radiology Ct Head Wo Contrast  Result Date: 06/14/2016 CLINICAL DATA:  Acute vertigo, hypertension, vomiting EXAM: CT HEAD WITHOUT CONTRAST TECHNIQUE: Contiguous axial images were obtained from the base of the skull through the vertex without intravenous contrast. COMPARISON:  03/27/2015, 06/24/2015 . FINDINGS: Brain: Stable brain atrophy and chronic white matter microvascular ischemic changes about the ventricles. Remote left basal ganglia lacunar-type infarct. No acute intracranial hemorrhage, mass lesion, new infarction, midline shift, herniation, hydrocephalus, or extra-axial fluid collection. Cisterns remain patent. No cerebellar abnormality. Vascular: No hyperdense vessel or unexpected calcification. Skull: Negative for fracture or focal lesion. Sinuses/Orbits: No acute findings.  Other: None. IMPRESSION: Stable atrophy and chronic white matter microvascular ischemic changes. No significant interval change or acute process. Electronically Signed   By: Judie Petit.  Shick M.D.   On: 06/14/2016 11:22   Mr Brain Wo Contrast (neuro Protocol)  Result Date: 06/14/2016 CLINICAL DATA:  Vertigo. Not currently on meds. Dizziness with vomiting. Stroke assessment. Documented arrhythmia. EXAM: MRI HEAD WITHOUT CONTRAST TECHNIQUE: Multiplanar, multiecho pulse sequences of the brain and surrounding structures were obtained without intravenous contrast. COMPARISON:  CT head 06/14/2016.  MR brain 06/24/2015 FINDINGS: No evidence for acute infarction, hemorrhage, mass lesion, hydrocephalus, or extra-axial fluid. Moderate cerebral and cerebellar atrophy. Mild subcortical and periventricular T2 and FLAIR hyperintensities, likely chronic microvascular ischemic change. Marked empty sella. Flattening of the gland against the floor with expansion of the bony confines. Flow voids are maintained throughout the carotid, basilar, and vertebral arteries. There are no areas of chronic hemorrhage. Cervical spondylosis. Mild tonsillar ectopia. Extracranial soft tissues unremarkable. no evidence for temporal bone inflammatory process. Compared with priors, similar appearance. IMPRESSION: Marked empty sella. The relationship of the patient's acute symptoms to this finding is unclear. Correlate clinically. Stable appearance from 2016. No acute stroke is evident nor is there evidence for vascular occlusion. Atrophy and small vessel disease, not unexpected for age. Extracranial soft tissues are unremarkable; no cause for dizziness is observed. Electronically Signed   By: Elsie Stain M.D.   On: 06/14/2016 15:16    Procedures Procedures (including critical care time)  Medications Ordered in ED Medications  sodium chloride 0.9 % bolus 500 mL (0 mLs Intravenous Stopped 06/14/16 1453)  meclizine (ANTIVERT) tablet 25 mg (25 mg Oral  Given 06/14/16 1127)     Initial Impression / Assessment and Plan / ED Course  I have reviewed the triage vital signs and the nursing notes.  Pertinent labs & imaging  results that were available during my care of the patient were reviewed by me and considered in my medical decision making (see chart for details).  Clinical Course  Comment By Time  Given many risk factors will eval for CVA with CT, labs, ECG. Likely will need MRI though as well. She has quickly improved and while still mildly symptomatic she has no localizing symptoms to say this is a code stroke. Pricilla Loveless, MD 08/03 1038  Patient's dizziness is better and she states she's essentially back to baseline. Will test her ambulation but given her multiple risk factors she will get an MRI to help rule out stroke. Lab work and CT are unremarkable. Pricilla Loveless, MD 08/03 1157  Dizziness is much better. She was able to ambulate well. MRI with no CVA. She notes chronic balance issues separate from dizziness, may be related to the empty sella but this is not changed. Recommend f/u with PCP Pricilla Loveless, MD 08/03 1535    Final Clinical Impressions(s) / ED Diagnoses   Final diagnoses:  Vertigo    New Prescriptions New Prescriptions   MECLIZINE (ANTIVERT) 25 MG TABLET    Take 0.5-1 tablets (12.5-25 mg total) by mouth 3 (three) times daily as needed for dizziness.     Pricilla Loveless, MD 06/14/16 [phone]

## 2016-06-14 NOTE — ED Notes (Signed)
Pharmacy tech asking about [name] home medications.  [name] daughter reports home medications were given to EMS.  ED staff reports that no medications were provided by EMS.

## 2016-06-14 NOTE — ED Triage Notes (Addendum)
Per EMS-history of Vertigo, not currently on meds-got up this am and felt dizzy-vomited multiple times per family-RBBB on 12 lead, VS [id number]-has not taken AM meds-stroke assessment negative-patient states she felt better in route

## 2016-06-14 NOTE — ED Notes (Signed)
MD at bedside. 

## 2016-06-14 NOTE — ED Notes (Signed)
Patient ambulated in hall, denies nausea & dizziness

## 2016-06-14 NOTE — ED Notes (Signed)
Bed: VJ50 Expected date:  Expected time:  Means of arrival:  Comments: Vertigo, vomiting 82 yr old

## 2016-06-26 ENCOUNTER — Telehealth: Payer: Self-pay | Admitting: Internal Medicine

## 2016-06-29 ENCOUNTER — Telehealth: Payer: Self-pay | Admitting: Emergency Medicine

## 2016-06-29 NOTE — Telephone Encounter (Signed)
I believe they send [place] a form, but do you agree that patient can no longer drive.

## 2016-06-29 NOTE — Telephone Encounter (Signed)
Patients son called and needs a form filled out stating patient can not longer sent to the [facility].  He also needs it to include the date she stopped driving [phone]. Fax number is [phone]. Please advise thanks.

## 2016-07-01 NOTE — Telephone Encounter (Signed)
Mrs Judy should not be driving Thx

## 2016-07-03 ENCOUNTER — Ambulatory Visit: Payer: Commercial Managed Care - HMO | Admitting: Internal Medicine

## 2016-07-04 NOTE — Telephone Encounter (Signed)
Left message with son.  RE: Need to know if the DMV is sending in the paperwork to be filled out. What is the date she stopped driving? What is the reason the DMV is needing this information?

## 2016-07-13 NOTE — Telephone Encounter (Signed)
LVM for pt to call back as soon as possible.   RE: please read the previous message in this phone note.

## 2016-07-17 ENCOUNTER — Emergency Department (HOSPITAL_COMMUNITY): Payer: Commercial Managed Care - HMO

## 2016-07-17 ENCOUNTER — Encounter (HOSPITAL_COMMUNITY): Payer: Self-pay | Admitting: Emergency Medicine

## 2016-07-17 ENCOUNTER — Inpatient Hospital Stay (HOSPITAL_COMMUNITY): Payer: Commercial Managed Care - HMO

## 2016-07-17 ENCOUNTER — Inpatient Hospital Stay (HOSPITAL_COMMUNITY)
Admission: EM | Admit: 2016-07-17 | Discharge: 2016-07-19 | DRG: 100 | Disposition: A | Discharge status: Home / Self Care | Payer: Commercial Managed Care - HMO | Attending: Internal Medicine | Admitting: Internal Medicine

## 2016-07-17 DIAGNOSIS — I10 Essential (primary) hypertension: Secondary | ICD-10-CM | POA: Diagnosis present

## 2016-07-17 DIAGNOSIS — I160 Hypertensive urgency: Secondary | ICD-10-CM | POA: Diagnosis present

## 2016-07-17 DIAGNOSIS — E1121 Type 2 diabetes mellitus with diabetic nephropathy: Secondary | ICD-10-CM | POA: Diagnosis present

## 2016-07-17 DIAGNOSIS — D638 Anemia in other chronic diseases classified elsewhere: Secondary | ICD-10-CM | POA: Diagnosis present

## 2016-07-17 DIAGNOSIS — R569 Unspecified convulsions: Principal | ICD-10-CM

## 2016-07-17 DIAGNOSIS — E785 Hyperlipidemia, unspecified: Secondary | ICD-10-CM | POA: Diagnosis present

## 2016-07-17 DIAGNOSIS — G40909 Epilepsy, unspecified, not intractable, without status epilepticus: Secondary | ICD-10-CM | POA: Diagnosis not present

## 2016-07-17 DIAGNOSIS — E11649 Type 2 diabetes mellitus with hypoglycemia without coma: Secondary | ICD-10-CM | POA: Diagnosis not present

## 2016-07-17 DIAGNOSIS — H518 Other specified disorders of binocular movement: Secondary | ICD-10-CM | POA: Diagnosis present

## 2016-07-17 DIAGNOSIS — Z8673 Personal history of transient ischemic attack (TIA), and cerebral infarction without residual deficits: Secondary | ICD-10-CM

## 2016-07-17 DIAGNOSIS — D649 Anemia, unspecified: Secondary | ICD-10-CM | POA: Diagnosis present

## 2016-07-17 DIAGNOSIS — I2510 Atherosclerotic heart disease of native coronary artery without angina pectoris: Secondary | ICD-10-CM | POA: Diagnosis not present

## 2016-07-17 DIAGNOSIS — R4182 Altered mental status, unspecified: Secondary | ICD-10-CM | POA: Diagnosis present

## 2016-07-17 DIAGNOSIS — Z955 Presence of coronary angioplasty implant and graft: Secondary | ICD-10-CM

## 2016-07-17 DIAGNOSIS — I509 Heart failure, unspecified: Secondary | ICD-10-CM | POA: Diagnosis not present

## 2016-07-17 DIAGNOSIS — Z7982 Long term (current) use of aspirin: Secondary | ICD-10-CM

## 2016-07-17 DIAGNOSIS — I5031 Acute diastolic (congestive) heart failure: Secondary | ICD-10-CM | POA: Diagnosis present

## 2016-07-17 DIAGNOSIS — E559 Vitamin D deficiency, unspecified: Secondary | ICD-10-CM | POA: Diagnosis present

## 2016-07-17 DIAGNOSIS — I110 Hypertensive heart disease with heart failure: Secondary | ICD-10-CM | POA: Diagnosis present

## 2016-07-17 DIAGNOSIS — Z79899 Other long term (current) drug therapy: Secondary | ICD-10-CM | POA: Diagnosis not present

## 2016-07-17 DIAGNOSIS — M810 Age-related osteoporosis without current pathological fracture: Secondary | ICD-10-CM | POA: Diagnosis present

## 2016-07-17 DIAGNOSIS — R32 Unspecified urinary incontinence: Secondary | ICD-10-CM | POA: Diagnosis present

## 2016-07-17 DIAGNOSIS — R29818 Other symptoms and signs involving the nervous system: Secondary | ICD-10-CM | POA: Diagnosis not present

## 2016-07-17 DIAGNOSIS — Z7984 Long term (current) use of oral hypoglycemic drugs: Secondary | ICD-10-CM

## 2016-07-17 DIAGNOSIS — E118 Type 2 diabetes mellitus with unspecified complications: Secondary | ICD-10-CM

## 2016-07-17 DIAGNOSIS — R1310 Dysphagia, unspecified: Secondary | ICD-10-CM | POA: Diagnosis present

## 2016-07-17 DIAGNOSIS — K219 Gastro-esophageal reflux disease without esophagitis: Secondary | ICD-10-CM | POA: Diagnosis present

## 2016-07-17 DIAGNOSIS — M109 Gout, unspecified: Secondary | ICD-10-CM | POA: Diagnosis present

## 2016-07-17 DIAGNOSIS — I5033 Acute on chronic diastolic (congestive) heart failure: Secondary | ICD-10-CM | POA: Diagnosis not present

## 2016-07-17 LAB — COMPREHENSIVE METABOLIC PANEL
ALBUMIN: 3.6 g/dL (ref 3.5–5.0)
ALK PHOS: 57 U/L (ref 38–126)
ALT: 10 U/L — ABNORMAL LOW (ref 14–54)
ANION GAP: 10 (ref 5–15)
AST: 22 U/L (ref 15–41)
BUN: 21 mg/dL — ABNORMAL HIGH (ref 6–20)
CALCIUM: 9.2 mg/dL (ref 8.9–10.3)
CO2: 18 mmol/L — AB (ref 22–32)
Chloride: 107 mmol/L (ref 101–111)
Creatinine, Ser: 1.1 mg/dL — ABNORMAL HIGH (ref 0.44–1.00)
GFR calc non Af Amer: 46 mL/min — ABNORMAL LOW (ref >60)
GFR, EST AFRICAN AMERICAN: 53 mL/min — AB (ref >60)
Glucose, Bld: 206 mg/dL — ABNORMAL HIGH (ref 65–99)
POTASSIUM: 4.3 mmol/L (ref 3.5–5.1)
SODIUM: 135 mmol/L (ref 135–145)
Total Bilirubin: 0.5 mg/dL (ref 0.3–1.2)
Total Protein: 7.2 g/dL (ref 6.5–8.1)

## 2016-07-17 LAB — I-STAT CHEM 8, ED
BUN: 23 mg/dL — ABNORMAL HIGH (ref 6–20)
CALCIUM ION: 1.21 mmol/L (ref 1.15–1.40)
Chloride: 106 mmol/L (ref 101–111)
Creatinine, Ser: 1 mg/dL (ref 0.44–1.00)
GLUCOSE: 209 mg/dL — AB (ref 65–99)
HCT: 29 % — ABNORMAL LOW (ref 36.0–46.0)
HEMOGLOBIN: 9.9 g/dL — AB (ref 12.0–15.0)
POTASSIUM: 4.4 mmol/L (ref 3.5–5.1)
SODIUM: 139 mmol/L (ref 135–145)
TCO2: 21 mmol/L (ref 0–100)

## 2016-07-17 LAB — GLUCOSE, CAPILLARY
GLUCOSE-CAPILLARY: 96 mg/dL (ref 65–99)
GLUCOSE-CAPILLARY: 101 mg/dL — AB (ref 65–99)
GLUCOSE-CAPILLARY: 66 mg/dL (ref 65–99)
GLUCOSE-CAPILLARY: 95 mg/dL (ref 65–99)
Glucose-Capillary: 58 mg/dL — ABNORMAL LOW (ref 65–99)

## 2016-07-17 LAB — MRSA PCR SCREENING: MRSA by PCR: NEGATIVE

## 2016-07-17 LAB — DIFFERENTIAL
BASOS PCT: 0 %
Basophils Absolute: 0 10*3/uL (ref 0.0–0.1)
EOS ABS: 0.1 10*3/uL (ref 0.0–0.7)
EOS PCT: 2 %
LYMPHS PCT: 17 %
Lymphs Abs: 1.2 10*3/uL (ref 0.7–4.0)
MONO ABS: 0.4 10*3/uL (ref 0.1–1.0)
Monocytes Relative: 7 %
NEUTROS PCT: 74 %
Neutro Abs: 5 10*3/uL (ref 1.7–7.7)

## 2016-07-17 LAB — CBC
HCT: 28.8 % — ABNORMAL LOW (ref 36.0–46.0)
Hemoglobin: 8.9 g/dL — ABNORMAL LOW (ref 12.0–15.0)
MCH: 29.6 pg (ref 26.0–34.0)
MCHC: 30.9 g/dL (ref 30.0–36.0)
MCV: 95.7 fL (ref 78.0–100.0)
PLATELETS: 252 10*3/uL (ref 150–400)
RBC: 3.01 MIL/uL — ABNORMAL LOW (ref 3.87–5.11)
RDW: 13.2 % (ref 11.5–15.5)
WBC: 6.7 10*3/uL (ref 4.0–10.5)

## 2016-07-17 LAB — PROCALCITONIN: Procalcitonin: <0.1 ng/mL

## 2016-07-17 LAB — I-STAT TROPONIN, ED: Troponin i, poc: 0.02 ng/mL (ref 0.00–0.08)

## 2016-07-17 LAB — CBG MONITORING, ED: GLUCOSE-CAPILLARY: 197 mg/dL — AB (ref 65–99)

## 2016-07-17 LAB — PROTIME-INR
INR: 1.09
PROTHROMBIN TIME: 14.1 s (ref 11.4–15.2)

## 2016-07-17 LAB — RAPID URINE DRUG SCREEN, HOSP PERFORMED
AMPHETAMINES: NOT DETECTED
BARBITURATES: NOT DETECTED
BENZODIAZEPINES: NOT DETECTED
COCAINE: NOT DETECTED
Opiates: NOT DETECTED
TETRAHYDROCANNABINOL: NOT DETECTED

## 2016-07-17 LAB — APTT: aPTT: 58 seconds — ABNORMAL HIGH (ref 24–36)

## 2016-07-17 LAB — URINALYSIS, ROUTINE W REFLEX MICROSCOPIC
Bilirubin Urine: NEGATIVE
GLUCOSE, UA: 100 mg/dL — AB
KETONES UR: NEGATIVE mg/dL
Leukocytes, UA: NEGATIVE
NITRITE: NEGATIVE
PH: 6 (ref 5.0–8.0)
Protein, ur: 100 mg/dL — AB
Specific Gravity, Urine: 1.014 (ref 1.005–1.030)

## 2016-07-17 LAB — URINE MICROSCOPIC-ADD ON

## 2016-07-17 LAB — BRAIN NATRIURETIC PEPTIDE: B NATRIURETIC PEPTIDE 5: 344.7 pg/mL — AB (ref 0.0–100.0)

## 2016-07-17 LAB — POC OCCULT BLOOD, ED: FECAL OCCULT BLD: NEGATIVE

## 2016-07-17 LAB — ETHANOL

## 2016-07-17 MED ORDER — ASPIRIN 325 MG PO TABS
325.0000 mg | ORAL_TABLET | Freq: Every day | ORAL | Status: DC
Start: 2016-07-17 — End: 2016-07-18
  Administered 2016-07-18: 325 mg via ORAL
  Filled 2016-07-17: qty 1

## 2016-07-17 MED ORDER — ENOXAPARIN SODIUM 40 MG/0.4ML ~~LOC~~ SOLN
40.0000 mg | SUBCUTANEOUS | Status: DC
  Administered 2016-07-17 – 2016-07-19 (×3): 40 mg via SUBCUTANEOUS
  Filled 2016-07-17 (×3): qty 0.4

## 2016-07-17 MED ORDER — INSULIN ASPART 100 UNIT/ML ~~LOC~~ SOLN
0.0000 [IU] | Freq: Three times a day (TID) | SUBCUTANEOUS | Status: DC
  Administered 2016-07-19: 1 [IU] via SUBCUTANEOUS

## 2016-07-17 MED ORDER — GADOBENATE DIMEGLUMINE 529 MG/ML IV SOLN
15.0000 mL | Freq: Once | INTRAVENOUS | Status: AC
  Administered 2016-07-17: 15 mL via INTRAVENOUS

## 2016-07-17 MED ORDER — LORAZEPAM 2 MG/ML IJ SOLN
INTRAMUSCULAR | Status: AC
  Filled 2016-07-17: qty 1

## 2016-07-17 MED ORDER — DEXTROSE 50 % IV SOLN
INTRAVENOUS | Status: AC
  Administered 2016-07-17: 25 mL
  Filled 2016-07-17: qty 50

## 2016-07-17 MED ORDER — SENNOSIDES-DOCUSATE SODIUM 8.6-50 MG PO TABS: 1.0000 | ORAL_TABLET | Freq: Every evening | ORAL | Status: DC | PRN

## 2016-07-17 MED ORDER — FUROSEMIDE 10 MG/ML IJ SOLN
40.0000 mg | Freq: Once | INTRAMUSCULAR | Status: AC
  Administered 2016-07-17: 40 mg via INTRAVENOUS
  Filled 2016-07-17: qty 4

## 2016-07-17 MED ORDER — ASPIRIN 300 MG RE SUPP
300.0000 mg | Freq: Every day | RECTAL | Status: DC
  Administered 2016-07-17: 300 mg via RECTAL
  Filled 2016-07-17: qty 1

## 2016-07-17 MED ORDER — SODIUM CHLORIDE 0.9 % IV SOLN
1000.0000 mg | Freq: Once | INTRAVENOUS | Status: AC
  Administered 2016-07-17: 1000 mg via INTRAVENOUS
  Filled 2016-07-17: qty 10

## 2016-07-17 MED ORDER — CARVEDILOL 12.5 MG PO TABS
12.5000 mg | ORAL_TABLET | Freq: Two times a day (BID) | ORAL | Status: DC
  Administered 2016-07-17 – 2016-07-18 (×2): 12.5 mg via ORAL
  Filled 2016-07-17 (×2): qty 1

## 2016-07-17 MED ORDER — OXYBUTYNIN CHLORIDE 5 MG PO TABS
5.0000 mg | ORAL_TABLET | Freq: Two times a day (BID) | ORAL | Status: DC
  Administered 2016-07-17 – 2016-07-18 (×2): 5 mg via ORAL
  Filled 2016-07-17 (×3): qty 1

## 2016-07-17 MED ORDER — INSULIN ASPART 100 UNIT/ML ~~LOC~~ SOLN: 0.0000 [IU] | Freq: Every day | SUBCUTANEOUS | Status: DC

## 2016-07-17 MED ORDER — SODIUM CHLORIDE 0.9 % IV SOLN
500.0000 mg | Freq: Two times a day (BID) | INTRAVENOUS | Status: DC
  Administered 2016-07-17 – 2016-07-18 (×3): 500 mg via INTRAVENOUS
  Filled 2016-07-17 (×4): qty 5

## 2016-07-17 MED ORDER — LORAZEPAM 2 MG/ML IJ SOLN
2.0000 mg | Freq: Once | INTRAMUSCULAR | Status: AC
  Administered 2016-07-17: 2 mg via INTRAVENOUS

## 2016-07-17 MED ORDER — HYDRALAZINE HCL 20 MG/ML IJ SOLN: 5.0000 mg | Freq: Four times a day (QID) | INTRAMUSCULAR | Status: DC | PRN

## 2016-07-17 NOTE — ED Provider Notes (Signed)
[facility]-EMERGENCY DEPT Provider Note   CSN: [id number] Arrival date & time: 07/17/16  0050  By signing my name below, I, Rosario Adie, attest that this documentation has been prepared under the direction and in the presence of Tomasita Crumble, MD. Electronically Signed: Rosario Adie, ED Scribe. 07/17/16. 1:21 AM.  History   Chief Complaint Chief Complaint  Patient presents with  . Code Stroke  . Seizures   LEVEL 5 CAVEAT: HPI and ROS limited due to altered mental status   The history is provided by the EMS personnel, the patient and a relative. No language interpreter was used.   HPI Comments: [name] is a 82 y.o. female BIB EMS, with a PMHX of TIA, CVA, and CAD s/p two stent placements, who presents to the Emergency Department complaining who presents as a code stroke, onset ~2 hours ago. Last known normal: ~2 hours PTA (2300). EMS reports that the patient called out to her family because she was not feeling well, and when her family found her she was in a planking position in a chair. EMS notes that she was unresponsive and not following commands on their arrival. Family reported to EMS that she has not been recently sick or taking a course of antibiotics. Pt denies vomiting, diarrhea, or cough at bedside.   Past Medical History:  Diagnosis Date  . Anemia    iron deficiency  . Aneurysm, thoracic aortic (HCC)   . Anxiety   . B12 deficiency   . CAD (coronary artery disease)    s/p stenting of LAD 1999- cath 5-08 EF normal LAD 30-40% restenosis. D1 50% D2 80% LCX & RCA minimal plaque  . Chronic back pain   . Constipation   . Depression   . Diabetes mellitus   . DVT (deep venous thrombosis) (HCC)   . GERD (gastroesophageal reflux disease)   . Gout   . HTN (hypertension)   . Hyperlipemia   . Obesity   . Osteoporosis   . Pancreatitis   . Polyarthritis    DJD/ possible PMR  . Renal insufficiency    Cr 1.2-1.3  . Tinnitus   . Urinary frequency   . Vertigo     . Vitamin D deficiency    Patient Active Problem List   Diagnosis Date Noted  . Seizure (HCC) 07/17/2016  . UTI (urinary tract infection) 02/28/2016  . Urinary incontinence 10/28/2015  . Alopecia 08/12/2015  . TIA (transient ischemic attack) 07/01/2015  . Acute encephalopathy 06/24/2015  . Protein-calorie malnutrition, severe (HCC) 04/19/2015  . Fever   . Abdominal abscess (HCC)   . Pressure ulcer 04/07/2015  . AKI (acute kidney injury) (HCC)   . Sepsis (HCC)   . Stroke (HCC)   . Facial droop   . Malnutrition of moderate degree (HCC) 03/20/2015  . Small bowel obstruction s/p exlap/LOA/decompression 03/25/2015 03/18/2015  . Essential hypertension 03/18/2015  . Noncompliance w/medication treatment due to intermit use of medication 12/07/2014  . Fall against object 06/24/2014  . Contusion of left hand 06/24/2014  . Contusion of left hip 06/24/2014  . Loss of weight 04/12/2014  . Malignant hypertension with renal failure and congestive heart failure (HCC) 09/23/2013  . Knee pain, bilateral 07/27/2013  . Chronic fatigue disorder 01/19/2013  . Vaginitis due to Candida 09/02/2012  . Sinusitis 09/02/2012  . Anemia 09/02/2012  . Polymyalgia rheumatica (HCC) 09/10/2011  . Vertigo 08/21/2011  . Fatigue 05/11/2011  . ANEMIA OF CHRONIC DISEASE 09/06/2010  . KNEE PAIN, CHRONIC  05/26/2010  . DIZZINESS 05/11/2010  . SHOULDER PAIN 04/25/2010  . FREQUENCY, URINARY 03/16/2010  . CONSTIPATION, CHRONIC 01/17/2010  . FOOT PAIN 01/17/2009  . RASH AND OTHER NONSPECIFIC SKIN ERUPTION 01/17/2009  . TINNITUS NOS 10/18/2008  . B12 deficiency 08/10/2008  . LOW BACK PAIN 06/23/2008  . CHEST WALL PAIN 06/23/2008  . DYSURIA 03/18/2008  . Abdominal pain 03/18/2008  . HYPERLIPIDEMIA 12/24/2007  . DYSPNEA 12/24/2007  . Anxiety state 12/07/2007  . GERD 12/07/2007  . CHEST PAIN 12/07/2007  . OTHER SPEC FORMS CHRONIC ISCHEMIC HEART DISEASE 12/01/2007  . Diabetes type 2, controlled (HCC) 11/28/2007   . Pain in joint 11/28/2007  . GOUT 09/17/2007  . Adjustment disorder with mixed anxiety and depressed mood 09/17/2007  . Coronary atherosclerosis 09/17/2007  . Disorder resulting from impaired renal function 09/17/2007  . OSTEOPOROSIS 09/17/2007  . DVT, HX OF 09/17/2007  . PANCREATITIS, HX OF 09/17/2007   Past Surgical History:  Procedure Laterality Date  . ABDOMINAL HYSTERECTOMY    . CARDIAC CATHETERIZATION  2008   L main 20%, LAD stent patent, D1 50%, D2 80% (small), RCA 20%, EF 55-60%  . CHOLECYSTECTOMY    . CORONARY ANGIOPLASTY WITH STENT PLACEMENT  1999   LAD stent  . HEMORRHOID SURGERY    . LAPAROSCOPIC LYSIS OF ADHESIONS N/A 03/24/2015   Procedure: LAPAROSCOPIC LYSIS OF ADHESIONS;  Surgeon: Luretha Murphy, MD;  Location: [facility];  Service: General;  Laterality: N/A;  . LAPAROSCOPY N/A 03/24/2015   Procedure: LAPAROSCOPY DIAGNOSTIC;  Surgeon: Luretha Murphy, MD;  Location: [facility];  Service: General;  Laterality: N/A;  . LAPAROTOMY N/A 03/24/2015   Procedure: LAPAROTOMY with decompression of bowel;  Surgeon: Luretha Murphy, MD;  Location: [facility];  Service: General;  Laterality: N/A;  . TUBAL LIGATION     OB History    No data available     Home Medications    Prior to Admission medications   Medication Sig Start Date End Date Taking? Authorizing Provider  aspirin EC 325 MG tablet Take 325 mg by mouth daily.   Yes Historical Provider, MD  carvedilol (COREG) 25 MG tablet TAKE 1 TABLET TWICE DAILY WITH A MEAL Patient taking differently: Take 25 mg by mouth 2 (two) times daily with a meal.  11/29/15  Yes Tresa Garter, MD  metFORMIN (GLUCOPHAGE) 500 MG tablet Take 1 tablet (500 mg total) by mouth 2 (two) times daily with a meal. 10/10/15  Yes Tresa Garter, MD  oxybutynin (DITROPAN) 5 MG tablet Take 1 tablet (5 mg total) by mouth 3 (three) times daily. Patient taking differently: Take 5 mg by mouth 2 (two) times daily.  05/01/16  Yes Tresa Garter, MD  ACCU-CHEK  SMARTVIEW test strip every morning. 06/07/15   Historical Provider, MD  acetaminophen (TYLENOL) 650 MG CR tablet Take 650 mg by mouth daily.    Historical Provider, MD  cholecalciferol (VITAMIN D) 1000 units tablet Take 1,000 Units by mouth daily.    Historical Provider, MD  DUREZOL 0.05 % EMUL Place 1 drop into the left eye 2 (two) times daily. 04/09/16   Historical Provider, MD  glucose blood (ONETOUCH VERIO) test strip Use as instructed 08/12/15   Tresa Garter, MD  hydrALAZINE (APRESOLINE) 50 MG tablet Take 1 tablet (50 mg total) by mouth daily. Reported on 11/29/2015 11/29/15   [name] Plotnikov, MD  meclizine (ANTIVERT) 25 MG tablet Take 0.5-1 tablets (12.5-25 mg total) by mouth 3 (three) times daily as needed for  dizziness. 06/14/16   Pricilla Loveless, MD  [facility] DELICA LANCETS FINE MISC 1 Device by Does not apply route daily as needed. 08/12/15   [name] Plotnikov, MD  vitamin B-12 (CYANOCOBALAMIN) 1000 MCG tablet Take 1,000 mcg by mouth daily.    Historical Provider, MD    Family History Family History  Problem Relation Age of Onset  . Adopted: Yes  . Diabetes Mother   . Hypertension Father    Social History Social History  Substance Use Topics  . Smoking status: Never Smoker  . Smokeless tobacco: Never Used  . Alcohol use No   Allergies   Amlodipine besylate; Atenolol; Benazepril; Benicar [olmesartan medoxomil]; Cozaar; Hydralazine; Hydrochlorothiazide w-triamterene; Hydrocodone; Hydroxyzine pamoate; Iodine; Lisinopril; Peach flavor; Penicillins; Pravastatin; Prednisolone; Strawberry flavor; Tramadol hcl; and Benadryl [diphenhydramine]  Review of Systems Review of Systems  Unable to perform ROS: Mental status change   Physical Exam Updated Vital Signs BP 163/92   Pulse 72   Resp 16   Ht 5\' 1"  (1.549 m)   Wt 140 lb (63.5 kg)   SpO2 95%   BMI 26.45 kg/m   Physical Exam  Constitutional: She appears well-developed and well-nourished. She appears distressed.    Moaning on exam.   HENT:  Head: Normocephalic and atraumatic.  Nose: Nose normal.  Mouth/Throat: Oropharynx is clear and moist. No oropharyngeal exudate.  Eyes: Conjunctivae and EOM are normal. Pupils are equal, round, and reactive to light. No scleral icterus.  Neck: Normal range of motion. Neck supple. No JVD present. No tracheal deviation present. No thyromegaly present.  Cardiovascular: Normal rate, regular rhythm and normal heart sounds.  Exam reveals no gallop and no friction rub.   No murmur heard. Pulmonary/Chest: Effort normal and breath sounds normal. No respiratory distress. She has no wheezes. She exhibits no tenderness.  Abdominal: Soft. Bowel sounds are normal. She exhibits no distension and no mass. There is no tenderness. There is no rebound and no guarding.  Musculoskeletal: Normal range of motion. She exhibits no edema or tenderness.  Lymphadenopathy:    She has no cervical adenopathy.  Neurological: She is alert. No cranial nerve deficit. She exhibits normal muscle tone.  5/5 strength to her UE, 3/5 strength to her LE. Normal sensation to all extremities.   Skin: Skin is warm and dry. No rash noted. No erythema. No pallor.  Nursing note and vitals reviewed.  ED Treatments / Results  DIAGNOSTIC STUDIES: Oxygen Saturation is 100% on RA, normal by my interpretation.   Labs (all labs ordered are listed, but only abnormal results are displayed) Labs Reviewed  APTT - Abnormal; Notable for the following:       Result Value   aPTT 58 (*)    All other components within normal limits  CBC - Abnormal; Notable for the following:    RBC 3.01 (*)    Hemoglobin 8.9 (*)    HCT 28.8 (*)    All other components within normal limits  COMPREHENSIVE METABOLIC PANEL - Abnormal; Notable for the following:    CO2 18 (*)    Glucose, Bld 206 (*)    BUN 21 (*)    Creatinine, Ser 1.10 (*)    ALT 10 (*)    GFR calc non Af Amer 46 (*)    GFR calc Af Amer 53 (*)    All other  components within normal limits  CBG MONITORING, ED - Abnormal; Notable for the following:    Glucose-Capillary 197 (*)    All other  components within normal limits  I-STAT CHEM 8, ED - Abnormal; Notable for the following:    BUN 23 (*)    Glucose, Bld 209 (*)    Hemoglobin 9.9 (*)    HCT 29.0 (*)    All other components within normal limits  PROTIME-INR  DIFFERENTIAL  ETHANOL  URINALYSIS, ROUTINE W REFLEX MICROSCOPIC (NOT AT [facility])  URINE RAPID DRUG SCREEN, HOSP PERFORMED  I-STAT TROPOININ, ED  POC OCCULT BLOOD, ED   EKG  EKG Interpretation  Date/Time:  Tuesday July 17 2016 01:15:13 EDT Ventricular Rate:  73 PR Interval:    QRS Duration: 143 QT Interval:  419 QTC Calculation: 462 R Axis:   61 Text Interpretation:  Sinus rhythm Right bundle branch block Baseline wander in lead(s) I III aVL No significant change since last tracing Confirmed by Erroll Luna [phone]) on 07/17/2016 1:34:57 AM      Radiology Dg Chest Port 1 View  Result Date: 07/17/2016 CLINICAL DATA:  Acute onset of seizure. Unresponsive. Initial encounter. EXAM: PORTABLE CHEST 1 VIEW COMPARISON:  Chest radiograph performed 04/12/2015 FINDINGS: The lungs are well-aerated. Vascular congestion is noted. Bibasilar airspace opacities may reflect atelectasis or possibly mild pneumonia. There is no evidence of pleural effusion or pneumothorax. The cardiomediastinal silhouette is borderline enlarged. No acute osseous abnormalities are seen. IMPRESSION: Vascular congestion and borderline cardiomegaly. Bibasilar airspace opacities may reflect atelectasis or possibly mild pneumonia. Electronically Signed   By: Roanna Raider M.D.   On: 07/17/2016 01:29   Ct Head Code Stroke W/o Cm  Result Date: 07/17/2016 CLINICAL DATA:  Code stroke. Initial evaluation for acute altered mental status. EXAM: CT HEAD WITHOUT CONTRAST TECHNIQUE: Contiguous axial images were obtained from the base of the skull through the vertex without  intravenous contrast. COMPARISON:  Recent CT and MRI from 06/14/2016. FINDINGS: Scalp soft tissues demonstrate no acute abnormality. Globes and orbits demonstrate no acute abnormality. Patient is status post lens extraction on the left. Mild scattered opacity within the ethmoidal air cells. Paranasal sinuses are otherwise clear. No mastoid effusion. Calvarium intact. Stable atrophy with chronic microvascular ischemic disease. Remote lacunar infarct versus dilated perivascular space within the left lentiform nucleus. No acute intracranial hemorrhage. No acute large vessel territory infarct. Gray-white matter differentiation maintained. Deep gray nuclei maintained. No hyperdense vessel. No mass lesion, midline shift, or mass effect. No hydrocephalus. No extra-axial fluid collection. Empty sella again noted. Radiology stomach without an obscured hand discal about the patient patellar I do not see a see a neuro ASPECTS (Alberta Stroke Program Early CT Score) - Ganglionic level infarction (caudate, lentiform nuclei, internal capsule, insula, M1-M3 cortex): 7 - Supraganglionic infarction (M4-M6 cortex): 3 Total score (0-10 with 10 being normal): 10 IMPRESSION: 1. No acute intracranial infarct or other process identified. 2. ASPECTS is 10 3. Stable atrophy with chronic microvascular ischemic disease. Critical Value/emergent results were called by telephone at the time of interpretation on 07/17/2016 at 1:20 am to Dr. Roseanne Reno , who verbally acknowledged these results. Electronically Signed   By: Rise Mu M.D.   On: 07/17/2016 01:22    Procedures Procedures (including critical care time)  Medications Ordered in ED Medications  LORazepam (ATIVAN) injection 2 mg (2 mg Intravenous Given 07/17/16 0110)  levETIRAcetam (KEPPRA) 1,000 mg in sodium chloride 0.9 % 100 mL IVPB (0 mg Intravenous Stopped 07/17/16 0159)     Initial Impression / Assessment and Plan / ED Course  I have reviewed the triage vital signs  and the nursing notes.  Pertinent labs & imaging results that were available during my care of the patient were reviewed by me and considered in my medical decision making (see chart for details).  Clinical Course    Patient presents to the emergency department for seizure and postictal state. She was initially billed as stroke and code stroke was activated. She was getting better upon my initial evaluation and then subsequently had a seizure. This was a repeat seizure without coming back to baseline - status epilepticus.  She was given 2 mg of Ativan and loaded with 1 g of Keppra. I appreciate Dr. [name] recommendation. Will admit to hospitalist per neurology recommendations. Chest x-ray does reveal possible pneumonia however patient was not having any symptoms per family. She continues to be drowsy likely from large Ativan dosage.   CRITICAL CARE Performed by: Tomasita Crumble   Total critical care time: 40 minutes - status elipticus  Critical care time was exclusive of separately billable procedures and treating other patients.  Critical care was necessary to treat or prevent imminent or life-threatening deterioration.  Critical care was time spent personally by me on the following activities: development of treatment plan with patient and/or surrogate as well as nursing, discussions with consultants, evaluation of patient's response to treatment, examination of patient, obtaining history from patient or surrogate, ordering and performing treatments and interventions, ordering and review of laboratory studies, ordering and review of radiographic studies, pulse oximetry and re-evaluation of patient's condition.   Final Clinical Impressions(s) / ED Diagnoses   Final diagnoses:  Seizures (HCC)    New Prescriptions New Prescriptions   No medications on file  .  I personally performed the services described in this documentation, which was scribed in my presence. The recorded information  has been reviewed and is accurate.      Tomasita Crumble, MD 07/17/16 [phone]

## 2016-07-17 NOTE — ED Notes (Signed)
Pt not responding to name. Pt responsive to pain. Pt family at bedside and has been very supportive. This RN unable to complete a stroke swallow screen since the pt is not awake and sitting upright with head control.

## 2016-07-17 NOTE — ED Notes (Signed)
Pt became unresponsive with RN at bedside. Pt witnessed to be staring off to the R side with her eye open. Pt no longer responding to name. Pt face seen twitching. MD immediately notified. Verbal order given for 2mg  IV.

## 2016-07-17 NOTE — Consult Note (Signed)
Admission H&P    Chief Complaint: New onset generalized seizures.  HPI: [name] is an 82 y.o. female history of hypertension, hyperlipidemia, old, diabetes mellitus, coronary artery disease and thoracic aortic aneurysm, brought to the emergency room with altered mental status and possible new onset stroke. Patient was noted to be confused and lethargic MS arrived. She had been well at 11:00 PM. No focal deficits were noted. CT scan of her head showed no acute intracranial abnormality. Patient had a witnessed seizure in the emergency room with head turning to the right side and eyes deviated to the right and of face and neck. Laboratory studies were unremarkable, including electrolytes. Glucose was 209. H&H has no history of seizure disorder and was on no anticonvulsant medication. She was given IV Ativan as well as loading dose of Keppra IV 1000 mg.  Past Medical History:  Diagnosis Date  . Anemia    iron deficiency  . Aneurysm, thoracic aortic (HCC)   . Anxiety   . B12 deficiency   . CAD (coronary artery disease)    s/p stenting of LAD 1999- cath 5-08 EF normal LAD 30-40% restenosis. D1 50% D2 80% LCX & RCA minimal plaque  . Chronic back pain   . Constipation   . Depression   . Diabetes mellitus   . DVT (deep venous thrombosis) (HCC)   . GERD (gastroesophageal reflux disease)   . Gout   . HTN (hypertension)   . Hyperlipemia   . Obesity   . Osteoporosis   . Pancreatitis   . Polyarthritis    DJD/ possible PMR  . Renal insufficiency    Cr 1.2-1.3  . Tinnitus   . Urinary frequency   . Vertigo   . Vitamin D deficiency     Past Surgical History:  Procedure Laterality Date  . ABDOMINAL HYSTERECTOMY    . CARDIAC CATHETERIZATION  2008   L main 20%, LAD stent patent, D1 50%, D2 80% (small), RCA 20%, EF 55-60%  . CHOLECYSTECTOMY    . CORONARY ANGIOPLASTY WITH STENT PLACEMENT  1999   LAD stent  . HEMORRHOID SURGERY    . LAPAROSCOPIC LYSIS OF ADHESIONS N/A 03/24/2015   Procedure: LAPAROSCOPIC LYSIS OF ADHESIONS;  Surgeon: Luretha Murphy, MD;  Location: [facility];  Service: General;  Laterality: N/A;  . LAPAROSCOPY N/A 03/24/2015   Procedure: LAPAROSCOPY DIAGNOSTIC;  Surgeon: Luretha Murphy, MD;  Location: [facility];  Service: General;  Laterality: N/A;  . LAPAROTOMY N/A 03/24/2015   Procedure: LAPAROTOMY with decompression of bowel;  Surgeon: Luretha Murphy, MD;  Location: [facility];  Service: General;  Laterality: N/A;  . TUBAL LIGATION      Family History  Problem Relation Age of Onset  . Adopted: Yes  . Diabetes Mother   . Hypertension Father    Social History:  reports that she has never smoked. She does not have any smokeless tobacco history on file. She reports that she does not drink alcohol or use drugs.  Allergies:  Allergies  Allergen Reactions  . Amlodipine Besylate Other (See Comments)     dizzy  . Atenolol Other (See Comments)    Fatigue  . Benazepril Cough  . Benicar [Olmesartan Medoxomil] Other (See Comments)    HEADACHE  . Cozaar     nausea  . Hydralazine     Hair loss  . Hydrochlorothiazide W-Triamterene Other (See Comments)     dizzy  . Hydrocodone Other (See Comments)    HEADACHE  . Hydroxyzine Pamoate Other (  See Comments)    Per MAR  . Iodine Other (See Comments)    Per MAR  . Lisinopril Other (See Comments) and Cough    Tired & fatigue  . Peach Flavor Itching  . Penicillins Itching    Has patient had a PCN reaction causing immediate rash, facial/tongue/throat swelling, SOB or lightheadedness with hypotension: No Has patient had a PCN reaction causing severe rash involving mucus membranes or skin necrosis: No Has patient had a PCN reaction that required hospitalization No Has patient had a PCN reaction occurring within the last 10 years: Yes If all of the above answers are "NO", then may proceed with Cephalosporin use.  tolerates cephalosporins OK   . Pravastatin Other (See Comments)    Myalgias-muscle pain  .  Prednisolone Nausea Only and Other (See Comments)    Upset stomach  . Strawberry Flavor Itching  . Tramadol Hcl Other (See Comments)    headache  . Benadryl [Diphenhydramine] Itching and Palpitations    Medications: Preadmission medications were reviewed by me.  ROS: Unavailable due to patient's altered mental status.  Physical Examination: There were no vitals taken for this visit.  HEENT-  Normocephalic, no lesions, without obvious abnormality.  Normal external eye and conjunctiva.  Normal TM's bilaterally.  Normal auditory canals and external ears. Normal external nose, mucus membranes and septum.  Normal pharynx. Neck supple with no masses, nodes, nodules or enlargement. Cardiovascular - regular rate and rhythm, S1, S2 normal, no murmur, click, rub or gallop Lungs - chest clear, no wheezing, rales, normal symmetric air entry Abdomen - soft, non-tender; bowel sounds normal; no masses,  no organomegaly Extremities - no joint deformities, effusion, or inflammation  Neurologic Examination:  Prior to seizure activity in the ED. Patient was lethargic but able to follow simple commands. She was slightly confused with time. Pupils were equal and reacted normally to light. Extraocular movements were full and conjugate. No facial asymmetry was noted. Patient moved extremities equally with no signs of focal weakness. Deep tendon reflexes were trace to 1+ and symmetrical. Plantar responses were extensor bilaterally.  Results for orders placed or performed during the hospital encounter of 07/17/16 (from the past 48 hour(s))  Protime-INR     Status: None   Collection Time: 07/17/16 12:52 AM  Result Value Ref Range   Prothrombin Time 14.1 11.4 - 15.2 seconds   INR 1.09   APTT     Status: Abnormal   Collection Time: 07/17/16 12:52 AM  Result Value Ref Range   aPTT 58 (H) 24 - 36 seconds    Comment:        IF BASELINE aPTT IS ELEVATED, SUGGEST PATIENT RISK ASSESSMENT BE USED TO  DETERMINE APPROPRIATE ANTICOAGULANT THERAPY.   CBC     Status: Abnormal   Collection Time: 07/17/16 12:52 AM  Result Value Ref Range   WBC 6.7 4.0 - 10.5 K/uL   RBC 3.01 (L) 3.87 - 5.11 MIL/uL   Hemoglobin 8.9 (L) 12.0 - 15.0 g/dL   HCT 16.1 (L) 09.6 - 04.5 %   MCV 95.7 78.0 - 100.0 fL   MCH 29.6 26.0 - 34.0 pg   MCHC 30.9 30.0 - 36.0 g/dL   RDW 40.9 81.1 - 91.4 %   Platelets 252 150 - 400 K/uL  Differential     Status: None   Collection Time: 07/17/16 12:52 AM  Result Value Ref Range   Neutrophils Relative % 74 %   Neutro Abs 5.0 1.7 - 7.7 K/uL  Lymphocytes Relative 17 %   Lymphs Abs 1.2 0.7 - 4.0 K/uL   Monocytes Relative 7 %   Monocytes Absolute 0.4 0.1 - 1.0 K/uL   Eosinophils Relative 2 %   Eosinophils Absolute 0.1 0.0 - 0.7 K/uL   Basophils Relative 0 %   Basophils Absolute 0.0 0.0 - 0.1 K/uL  I-stat troponin, ED     Status: None   Collection Time: 07/17/16  1:09 AM  Result Value Ref Range   Troponin i, poc 0.02 0.00 - 0.08 ng/mL   Comment 3            Comment: Due to the release kinetics of cTnI, a negative result within the first hours of the onset of symptoms does not rule out myocardial infarction with certainty. If myocardial infarction is still suspected, repeat the test at appropriate intervals.   CBG monitoring, ED     Status: Abnormal   Collection Time: 07/17/16  1:09 AM  Result Value Ref Range   Glucose-Capillary 197 (H) 65 - 99 mg/dL  I-Stat Chem 8, ED     Status: Abnormal   Collection Time: 07/17/16  1:11 AM  Result Value Ref Range   Sodium 139 135 - 145 mmol/L   Potassium 4.4 3.5 - 5.1 mmol/L   Chloride 106 101 - 111 mmol/L   BUN 23 (H) 6 - 20 mg/dL   Creatinine, Ser 1.70 0.44 - 1.00 mg/dL   Glucose, Bld 017 (H) 65 - 99 mg/dL   Calcium, Ion 4.94 4.96 - 1.40 mmol/L   TCO2 21 0 - 100 mmol/L   Hemoglobin 9.9 (L) 12.0 - 15.0 g/dL   HCT 75.9 (L) 16.3 - 84.6 %   No results found.  Assessment/Plan 82 year old lady with multiple known medical  abnormalities presenting with new onset seizure activity. It's likely that she experienced an unwitnessed seizure at home and was postictal when discovered by family members and subsequently evaluated by EMS. CT scan of her head showed no acute intracranial abnormality. Clinical exam showed no focal deficits. Laboratory studies were unremarkable.  Recommendations: 1. Continue Keppra 500 mg every 12 hours 2. MRI of the brain without and with contrast 3. EEG, routine adult study 4. Admission to step down unit for further management  We will continue to follow this patient with you.  C.R. Roseanne Reno, MD [facility]  07/17/2016, 1:23 AM

## 2016-07-17 NOTE — Evaluation (Signed)
Clinical/Bedside Swallow Evaluation Patient Details  Name: [name] MRN: [id number] Date of Birth: 1933-10-23  Today's Date: 07/17/2016 Time: SLP Start Time (ACUTE ONLY): 1620 SLP Stop Time (ACUTE ONLY): 1639 SLP Time Calculation (min) (ACUTE ONLY): 19 min  Past Medical History:  Past Medical History:  Diagnosis Date  . Anemia    iron deficiency  . Aneurysm, thoracic aortic (HCC)   . Anxiety   . B12 deficiency   . CAD (coronary artery disease)    s/p stenting of LAD 1999- cath 5-08 EF normal LAD 30-40% restenosis. D1 50% D2 80% LCX & RCA minimal plaque  . Chronic back pain   . Constipation   . Depression   . Diabetes mellitus   . DVT (deep venous thrombosis) (HCC)   . GERD (gastroesophageal reflux disease)   . Gout   . HTN (hypertension)   . Hyperlipemia   . Obesity   . Osteoporosis   . Pancreatitis   . Polyarthritis    DJD/ possible PMR  . Renal insufficiency    Cr 1.2-1.3  . Tinnitus   . Urinary frequency   . Vertigo   . Vitamin D deficiency    Past Surgical History:  Past Surgical History:  Procedure Laterality Date  . ABDOMINAL HYSTERECTOMY    . CARDIAC CATHETERIZATION  2008   L main 20%, LAD stent patent, D1 50%, D2 80% (small), RCA 20%, EF 55-60%  . CHOLECYSTECTOMY    . CORONARY ANGIOPLASTY WITH STENT PLACEMENT  1999   LAD stent  . HEMORRHOID SURGERY    . LAPAROSCOPIC LYSIS OF ADHESIONS N/A 03/24/2015   Procedure: LAPAROSCOPIC LYSIS OF ADHESIONS;  Surgeon: Luretha Murphy, MD;  Location: [facility];  Service: General;  Laterality: N/A;  . LAPAROSCOPY N/A 03/24/2015   Procedure: LAPAROSCOPY DIAGNOSTIC;  Surgeon: Luretha Murphy, MD;  Location: [facility];  Service: General;  Laterality: N/A;  . LAPAROTOMY N/A 03/24/2015   Procedure: LAPAROTOMY with decompression of bowel;  Surgeon: Luretha Murphy, MD;  Location: [facility];  Service: General;  Laterality: N/A;  . TUBAL LIGATION     HPI:  Pt is 82 y/o female with a PMH significant for TIA/stroke, NIDDM, HTN, anemia,  and SBO 1 year ago requiring laparotomywho presents with seizure. Pt head MRI and CT showed no acute abnormality/infarct.   Assessment / Plan / Recommendation Clinical Impression  Pt was asleep upon arrival, but given moderate cueing pt was able to be aroused for swallowing evaluation. Pt remained drowsy throughout evaluation, but consumed PO trials with no s/s of aspiration. Pt showed oral holding and impaired mastication during solid food trials. Pt was able to clear solid bolus given sips of liquid to follow. Pt consumed all other PO trials with no difficulty. Recommend Dys 1 diet and thin liquids with good prognosis for diet advancement given time and increased aroused state.    Aspiration Risk  Mild aspiration risk    Diet Recommendation Dysphagia 1 (Puree);Thin liquid   Liquid Administration via: Straw;Cup Medication Administration: Crushed with puree Supervision: Full supervision/cueing for compensatory strategies Compensations: Minimize environmental distractions;Slow rate;Small sips/bites Postural Changes: Seated upright at 90 degrees;Remain upright for at least 30 minutes after po intake    Other  Recommendations Oral Care Recommendations: Oral care BID   Follow up Recommendations  24 hour supervision/assistance    Frequency and Duration min 2x/week  2 weeks       Prognosis Prognosis for Safe Diet Advancement: Good      Swallow Study  General HPI: Pt is 82 y/o female with a PMH significant for TIA/stroke, NIDDM, HTN, anemia, and SBO 1 year ago requiring laparotomywho presents with seizure. Pt head MRI and CT showed no acute abnormality/infarct. Type of Study: Bedside Swallow Evaluation Diet Prior to this Study: NPO Temperature Spikes Noted: No Respiratory Status: Room air History of Recent Intubation: No Behavior/Cognition: Cooperative;Pleasant mood;Lethargic/Drowsy Oral Cavity Assessment: Within Functional Limits Oral Care Completed by SLP: No Oral Cavity -  Dentition: Dentures, not available Self-Feeding Abilities: Total assist Patient Positioning: Upright in bed Baseline Vocal Quality: Normal Volitional Cough: Weak Volitional Swallow: Able to elicit    Oral/Motor/Sensory Function Overall Oral Motor/Sensory Function: Within functional limits   Ice Chips Ice chips: Within functional limits Presentation: Spoon   Thin Liquid Thin Liquid: Within functional limits Presentation: Straw;Spoon    Nectar Thick Nectar Thick Liquid: Not tested   Honey Thick Honey Thick Liquid: Not tested   Puree Puree: Within functional limits Presentation: Spoon   Solid   GO   Solid: Impaired Oral Phase Functional Implications: Oral holding;Impaired mastication       Tollie Eth, Student SLP  Caryl Never 07/17/2016,5:02 PM

## 2016-07-17 NOTE — Progress Notes (Signed)
PROGRESS NOTE    [name]  CBI:[id number] DOB: [birth year] DOA: 07/17/2016 PCP: [name], MD   Brief Narrative:   82 y.o. BF PMHx Anxiety,Depression, TIA/Stroke, DM Type 2  controlled with, complications, HTN, CAD native artery S/P multiple stents, Anemia, and SBO 1 year ago requiring laparotomy. DVT, Pancreatitis, Renal insufficiency   who presents with seizure.  All history is collected from the patient's daughter at the bedside, as the patient is unresponsive. Daughter reports that the patient was her normal self, interactive all day, went for a drive in the evening, no complaints of fever, chills, cough, vomiting, malaise, increased urination, dysuria, diarrhea, weakness, fatigue.  In the evening as she was getting ready for bed, she called her daughter to the bedroom saying she thought "my sugar is low". Her daughter fetched her some orange juice and soup to her room. About 10 minutes later she went back to check on how she liked it, and when she walked in the room her mother was lying down, unresponsive, with her mouth twitching. The daughter called her name and touched her but she did not respond. Daughter tried to sit her up and noted her body was stiff, so she immediately called 9-1-1.  EMS found the patient with normal glucose but lethargic and confused and activated CODE STROKE.   Subjective: 9/5 A/O 3 (does not know why ), follows all commands.   Assessment & Plan:   Principal Problem:   Seizure (HCC) Active Problems:   Type 2 diabetes mellitus with diabetic nephropathy, without long-term current use of insulin (HCC)   Anemia in other chronic diseases classified elsewhere   Essential hypertension   Hypertensive urgency   Acute diastolic CHF (congestive heart failure) (HCC)   Seizure:  Unclear precipitant.  No previous seizure.  Electrolytes normal, no new medications, on no medications to lower seizure threshold.  No clear source of infection (CXR opacities  are favored to be edema, but will check procalcitonin).  No hypoglycemia per EMS and only on metformin.  No alcohol use per family.   -Seizure precautions -Consult to Neurology, appreciate cares -Continue Keppra 500 BID -EEG pending -MRI brain: Negative for acute infarct, chronic infarct present see results below   2. Acute diastolic CHF:  EF 60% 1 year ago.  Hypertensive urgency with mild pulmonary edema at admission.  No hypoxia. -Check BNP -Furosemide 40 mg once -Repeat BMP -Strict ins and outs -Daily weight Filed Weights   07/17/16 0149 07/17/16 0423  Weight: 63.5 kg (140 lb) 70.1 kg (154 lb 8.7 oz)    3. Essential hypertension with hypertensive urgency:  On carvedilol at home only. Hypertensive admission, resolved to 160/90 without intervention. -Hydralazine when necessary for SBP greater than 210 or DBP greater than 120 -Coreg 12.5 mg BID (home dose 25 mg)  4. Anemia, normocytic:  Stable, at baseline 8-10 g/dL.  5. DM Type 2   controlled with complication -4/18 Hemoglobin A1c= 6.2 -Hold her metformin -Sensitive SSI  6. Previous stroke: -Continue aspirin 325 daily  7. Urinary incontinence: -Oxybutynin 5 mg  BID   DVT prophylaxis: Lovenox Code Status: Full Family Communication: Multiple daughters present at bedside Disposition Plan: Per neurology   Consultants:  Dr.[facility]  Procedures/Significant Events:  06/25/2015 Echocardiogram;Left ventricle: The cavity size was normal. Wall thickness was   normal. Systolic function was normal. The estimated ejection   fraction was in the range of 60% to 65%. Wall motion was normal;   there were no regional wall  motion abnormalities. Doppler   parameters are consistent with abnormal left ventricular   relaxation (grade 1 diastolic dysfunction). - Left atrium: The atrium was moderately dilated. 9/5 CT head:-Negative acute infarct-Remote lacunar infarct vs dilated perivascular space within the  left lentiform nucleus. MRI brain W/W Wo contrast:Negative for acute infarct.  -Small chronic infarct right cerebellum.-Benign cyst left basal ganglia unchanged. -Empty sella unchanged.  Cultures 9/5 MRSA by PCR negative  Antimicrobials: None  Devices None   LINES / TUBES:      Continuous Infusions:    Objective: Vitals:   07/17/16 0748 07/17/16 0917 07/17/16 1225 07/17/16 1500  BP: (!) 194/67 (!) 199/80 (!) 191/79 (!) 149/78  Pulse: (!) 53 60 67 86  Resp: 12 12 14 17   Temp: 97.3 F (36.3 C)  97.5 F (36.4 C) 98.1 F (36.7 C)  TempSrc: Axillary  Oral Oral  SpO2: 100% 99% 99% 98%  Weight:      Height:        Intake/Output Summary (Last 24 hours) at 07/17/16 1516 Last data filed at 07/17/16 1214  Gross per 24 hour  Intake              105 ml  Output                0 ml  Net              105 ml   Filed Weights   07/17/16 0149 07/17/16 0423  Weight: 63.5 kg (140 lb) 70.1 kg (154 lb 8.7 oz)    Examination:  General: Patient sleepy but arousable, A/O 2 (does not know where, why), NAD, No acute respiratory distress Eyes: negative scleral hemorrhage, negative anisocoria, negative icterus ENT: Negative Runny nose, negative gingival bleeding, Neck:  Negative scars, masses, torticollis, lymphadenopathy, JVD Lungs: Clear to auscultation bilaterally without wheezes or crackles Cardiovascular: Regular rate and rhythm without murmur gallop or rub normal S1 and S2 Abdomen: negative abdominal pain, nondistended, positive soft, bowel sounds, no rebound, no ascites, no appreciable mass Extremities: No significant cyanosis, clubbing, or edema bilateral lower extremities Skin: Negative rashes, lesions, ulcers Psychiatric:  Negative depression, negative anxiety, negative fatigue, negative mania  Central nervous system:  Cranial nerves II through XII intact, tongue/uvula midline, all extremities muscle strength 5/5, sensation intact throughout, finger nose finger bilateral  within normal limits, quick finger touch bilateral within normal limits, negative negative dysarthria, negative expressive aphasia, negative receptive aphasia.  .     Data Reviewed: Care during the described time interval was provided by me .  I have reviewed this patient's available data, including medical history, events of note, physical examination, and all test results as part of my evaluation. I have personally reviewed and interpreted all radiology studies.  CBC:  Recent Labs Lab 07/17/16 0052 07/17/16 0111  WBC 6.7  --   NEUTROABS 5.0  --   HGB 8.9* 9.9*  HCT 28.8* 29.0*  MCV 95.7  --   PLT 252  --    Basic Metabolic Panel:  Recent Labs Lab 07/17/16 0052 07/17/16 0111  NA 135 139  K 4.3 4.4  CL 107 106  CO2 18*  --   GLUCOSE 206* 209*  BUN 21* 23*  CREATININE 1.10* 1.00  CALCIUM 9.2  --    GFR: Estimated Creatinine Clearance: 40.5 mL/min (by C-G formula based on SCr of 1 mg/dL). Liver Function Tests:  Recent Labs Lab 07/17/16 0052  AST 22  ALT 10*  ALKPHOS 57  BILITOT  0.5  PROT 7.2  ALBUMIN 3.6   No results for input(s): LIPASE, AMYLASE in the last 168 hours. No results for input(s): AMMONIA in the last 168 hours. Coagulation Profile:  Recent Labs Lab 07/17/16 0052  INR 1.09   Cardiac Enzymes: No results for input(s): CKTOTAL, CKMB, CKMBINDEX, TROPONINI in the last 168 hours. BNP (last 3 results) No results for input(s): PROBNP in the last 8760 hours. HbA1C: No results for input(s): HGBA1C in the last 72 hours. CBG:  Recent Labs Lab 07/17/16 0109 07/17/16 0744 07/17/16 1223 07/17/16 1305  GLUCAP 197* 96 58* 101*   Lipid Profile: No results for input(s): CHOL, HDL, LDLCALC, TRIG, CHOLHDL, LDLDIRECT in the last 72 hours. Thyroid Function Tests: No results for input(s): TSH, T4TOTAL, FREET4, T3FREE, THYROIDAB in the last 72 hours. Anemia Panel: No results for input(s): VITAMINB12, FOLATE, FERRITIN, TIBC, IRON, RETICCTPCT in the last  72 hours. Urine analysis:    Component Value Date/Time   COLORURINE STRAW (A) 07/17/2016 0218   APPEARANCEUR CLEAR 07/17/2016 0218   LABSPEC 1.014 07/17/2016 0218   LABSPEC 1.020 04/23/2006 1313   PHURINE 6.0 07/17/2016 0218   GLUCOSEU 100 (A) 07/17/2016 0218   GLUCOSEU NEGATIVE 02/28/2016 1559   HGBUR SMALL (A) 07/17/2016 0218   BILIRUBINUR NEGATIVE 07/17/2016 0218   BILIRUBINUR Negative 04/23/2006 1313   KETONESUR NEGATIVE 07/17/2016 0218   PROTEINUR 100 (A) 07/17/2016 0218   UROBILINOGEN 0.2 02/28/2016 1559   NITRITE NEGATIVE 07/17/2016 0218   LEUKOCYTESUR NEGATIVE 07/17/2016 0218   LEUKOCYTESUR Trace 04/23/2006 1313   Sepsis Labs: @LABRCNTIP (procalcitonin:4,lacticidven:4)  ) Recent Results (from the past 240 hour(s))  MRSA PCR Screening     Status: None   Collection Time: 07/17/16  4:26 AM  Result Value Ref Range Status   MRSA by PCR NEGATIVE NEGATIVE Final    Comment:        The GeneXpert MRSA Assay (FDA approved for NASAL specimens only), is one component of a comprehensive MRSA colonization surveillance program. It is not intended to diagnose MRSA infection nor to guide or monitor treatment for MRSA infections.          Radiology Studies: Mr Laqueta Jean Wo Contrast  Result Date: 07/17/2016 CLINICAL DATA:  Seizure EXAM: MRI HEAD WITHOUT AND WITH CONTRAST TECHNIQUE: Multiplanar, multiecho pulse sequences of the brain and surrounding structures were obtained without and with intravenous contrast. CONTRAST:  62mL MULTIHANCE GADOBENATE DIMEGLUMINE 529 MG/ML IV SOLN COMPARISON:  CT head 07/17/2016.  MRI 01/13/2016 FINDINGS: Mild atrophy.  Negative for hydrocephalus. Negative for acute infarct. Small chronic infarct right cerebellum. Benign cyst left basal ganglia unchanged. For negative for intracranial hemorrhage or fluid collection. Negative for mass or edema.  No shift of the midline structures. Postcontrast imaging reveals enhancing vessel in the left basal ganglia  which is prominent but probably not pathologic. No enhancing mass lesion. Leptomeningeal enhancement normal. Paranasal sinuses clear.  Normal orbit.  Empty sella unchanged. IMPRESSION: No acute abnormality and no change from prior studies. Electronically Signed   By: Marlan Palau M.D.   On: 07/17/2016 12:48   Dg Chest Port 1 View  Result Date: 07/17/2016 CLINICAL DATA:  Acute onset of seizure. Unresponsive. Initial encounter. EXAM: PORTABLE CHEST 1 VIEW COMPARISON:  Chest radiograph performed 04/12/2015 FINDINGS: The lungs are well-aerated. Vascular congestion is noted. Bibasilar airspace opacities may reflect atelectasis or possibly mild pneumonia. There is no evidence of pleural effusion or pneumothorax. The cardiomediastinal silhouette is borderline enlarged. No acute osseous abnormalities are seen. IMPRESSION: Vascular  congestion and borderline cardiomegaly. Bibasilar airspace opacities may reflect atelectasis or possibly mild pneumonia. Electronically Signed   By: Roanna Raider M.D.   On: 07/17/2016 01:29   Ct Head Code Stroke W/o Cm  Result Date: 07/17/2016 CLINICAL DATA:  Code stroke. Initial evaluation for acute altered mental status. EXAM: CT HEAD WITHOUT CONTRAST TECHNIQUE: Contiguous axial images were obtained from the base of the skull through the vertex without intravenous contrast. COMPARISON:  Recent CT and MRI from 06/14/2016. FINDINGS: Scalp soft tissues demonstrate no acute abnormality. Globes and orbits demonstrate no acute abnormality. Patient is status post lens extraction on the left. Mild scattered opacity within the ethmoidal air cells. Paranasal sinuses are otherwise clear. No mastoid effusion. Calvarium intact. Stable atrophy with chronic microvascular ischemic disease. Remote lacunar infarct versus dilated perivascular space within the left lentiform nucleus. No acute intracranial hemorrhage. No acute large vessel territory infarct. Gray-white matter differentiation maintained.  Deep gray nuclei maintained. No hyperdense vessel. No mass lesion, midline shift, or mass effect. No hydrocephalus. No extra-axial fluid collection. Empty sella again noted. Radiology stomach without an obscured hand discal about the patient patellar I do not see a see a neuro ASPECTS (Alberta Stroke Program Early CT Score) - Ganglionic level infarction (caudate, lentiform nuclei, internal capsule, insula, M1-M3 cortex): 7 - Supraganglionic infarction (M4-M6 cortex): 3 Total score (0-10 with 10 being normal): 10 IMPRESSION: 1. No acute intracranial infarct or other process identified. 2. ASPECTS is 10 3. Stable atrophy with chronic microvascular ischemic disease. Critical Value/emergent results were called by telephone at the time of interpretation on 07/17/2016 at 1:20 am to Dr. Roseanne Reno , who verbally acknowledged these results. Electronically Signed   By: Rise Mu M.D.   On: 07/17/2016 01:22        Scheduled Meds: . aspirin  300 mg Rectal Daily   Or  . aspirin  325 mg Oral Daily  . enoxaparin (LOVENOX) injection  40 mg Subcutaneous Q24H  . insulin aspart  0-5 Units Subcutaneous QHS  . insulin aspart  0-9 Units Subcutaneous TID WC  . levETIRAcetam  500 mg Intravenous Q12H   Continuous Infusions:    LOS: 0 days    Time spent: 40 minutes    WOODS, Roselind Messier, MD [facility] Pager [phone]   If 7PM-7AM, please contact night-coverage [email or web address] Password [email or web address] 07/17/2016, 3:16 PM

## 2016-07-17 NOTE — Progress Notes (Signed)
82 year old female from home via [facility], pt confused, lethargic, not following commands for EMS. CBG 209. LKW 2300. Pt began having a seizure in ED with head turning to right side and eyes deviated to right side. NIHSS 5. Pt given 2 mg ativan IVP and started on Keppra gtt. No hx of seizures. Pt to be admitted for further testing.

## 2016-07-17 NOTE — Procedures (Signed)
ELECTROENCEPHALOGRAM REPORT  Date of Study: 07/17/2016  Patient's Name: [name] MRN: [id number] Date of Birth: 1932-11-26  Referring Provider: Dr. Joen Laura  Clinical History: This is an 83 year old woman with unresponsiveness.  Medications: levETIRAcetam (KEPPRA) 500 mg in sodium chloride 0.9 % 100 mL IVPB  aspirin tablet 325 mg  hydrALAZINE (APRESOLINE) injection 5 mg   Technical Summary: A multichannel digital EEG recording measured by the international 10-20 system with electrodes applied with paste and impedances below 5000 ohms performed in our laboratory with EKG monitoring in a predominantly drowsy and asleep patient.  Hyperventilation and photic stimulation were not performed.  The digital EEG was referentially recorded, reformatted, and digitally filtered in a variety of bipolar and referential montages for optimal display.    Description: The patient is predominantly drowsy and asleep during the recording.  During brief period of wakefulness, there is a symmetric, low voltage 8 Hz posterior dominant rhythm that poorly attenuates to eye opening and eye closure.  The record is symmetric.  During drowsiness and sleep, there is an increase in theta slowing of the background.  Vertex waves and symmetric sleep spindles were seen.  Hyperventilation and photic stimulation were not performed. There were no epileptiform discharges or electrographic seizures seen.    EKG lead showed tachycardia with irregular rhythm.  Impression: This predominantly drowsy and asleep EEG is normal.    Clinical Correlation: A normal EEG does not exclude a clinical diagnosis of epilepsy. Clinical correlation is advised.   Patrcia Dolly, M.D.

## 2016-07-17 NOTE — H&P (Signed)
History and Physical  Patient Name: [name]     KPV:[id number]    DOB: [birth year]    DOA: 07/17/2016 PCP: [name], MD   Patient coming from: Home  Chief Complaint: Suddenly unresponsive  HPI: [name] is a 82 y.o. female with a past medical history significant for TIA/stroke, NIDDM, HTN, anemia, and SBO 1 year ago requiring laparotomy who presents with seizure.  All history is collected from the patient's daughter at the bedside, as the patient is unresponsive. Daughter reports that the patient was her normal self, interactive all day, went for a drive in the evening, no complaints of fever, chills, cough, vomiting, malaise, increased urination, dysuria, diarrhea, weakness, fatigue.  In the evening as she was getting ready for bed, she called her daughter to the bedroom saying she thought "my sugar is low". Her daughter fetched her some orange juice and soup to her room. About 10 minutes later she went back to check on how she liked it, and when she walked in the room her mother was lying down, unresponsive, with her mouth twitching. The daughter called her name and touched her but she did not respond. Daughter tried to sit her up and noted her body was stiff, so she immediately called 9-1-1.  EMS found the patient with normal glucose but lethargic and confused and activated CODE STROKE.  ED course: -On arrival to the ER, the patient was sluggish but non-focal -Afebrile, heart rate 80s, resiprations normal, BP 226/120 mmHg, pulse oximetry normal -Na 135, K 4.3, Cr 1.1 (baseline 0.8-1.0), WBC 6.7K, Hgb 8.9, INR normal, PTT slightly high, alcohol negative, troponin negative -CT head without contrast showed NAICP -CXR showed bilateral opacities consistent with edema -While being evaluated in the ER by neurology, the patient began to have right-sided eye deviation and twitching, so she was given lorazepam 2 mg IV and loaded with Keppra -TRH were asked to evaluate for  admission  The patient has no previous seizure history, on no anticonvulsants.  Does not use alcohol.  No new medications.     ROS: Review of Systems  Unable to perform ROS: Patient unresponsive          Past Medical History:  Diagnosis Date  . Anemia    iron deficiency  . Aneurysm, thoracic aortic (HCC)   . Anxiety   . B12 deficiency   . CAD (coronary artery disease)    s/p stenting of LAD 1999- cath 5-08 EF normal LAD 30-40% restenosis. D1 50% D2 80% LCX & RCA minimal plaque  . Chronic back pain   . Constipation   . Depression   . Diabetes mellitus   . DVT (deep venous thrombosis) (HCC)   . GERD (gastroesophageal reflux disease)   . Gout   . HTN (hypertension)   . Hyperlipemia   . Obesity   . Osteoporosis   . Pancreatitis   . Polyarthritis    DJD/ possible PMR  . Renal insufficiency    Cr 1.2-1.3  . Tinnitus   . Urinary frequency   . Vertigo   . Vitamin D deficiency     Past Surgical History:  Procedure Laterality Date  . ABDOMINAL HYSTERECTOMY    . CARDIAC CATHETERIZATION  2008   L main 20%, LAD stent patent, D1 50%, D2 80% (small), RCA 20%, EF 55-60%  . CHOLECYSTECTOMY    . CORONARY ANGIOPLASTY WITH STENT PLACEMENT  1999   LAD stent  . HEMORRHOID SURGERY    . LAPAROSCOPIC LYSIS  OF ADHESIONS N/A 03/24/2015   Procedure: LAPAROSCOPIC LYSIS OF ADHESIONS;  Surgeon: Luretha MurphyMatthew Martin, MD;  Location: [facility];  Service: General;  Laterality: N/A;  . LAPAROSCOPY N/A 03/24/2015   Procedure: LAPAROSCOPY DIAGNOSTIC;  Surgeon: Luretha MurphyMatthew Martin, MD;  Location: [facility];  Service: General;  Laterality: N/A;  . LAPAROTOMY N/A 03/24/2015   Procedure: LAPAROTOMY with decompression of bowel;  Surgeon: Luretha MurphyMatthew Martin, MD;  Location: [facility];  Service: General;  Laterality: N/A;  . TUBAL LIGATION      Social History: Patient lives With her husband and daughter.  The patient is independent with ADLs and ambulatory with assistance.  Non-smoker.  Allergies  Allergen Reactions  .  Amlodipine Besylate Other (See Comments)     dizzy  . Atenolol Other (See Comments)    Fatigue  . Benazepril Cough  . Benicar [Olmesartan Medoxomil] Other (See Comments)    HEADACHE  . Cozaar     nausea  . Hydralazine     Hair loss  . Hydrochlorothiazide W-Triamterene Other (See Comments)     dizzy  . Hydrocodone Other (See Comments)    HEADACHE  . Hydroxyzine Pamoate Other (See Comments)    Per MAR  . Iodine Other (See Comments)    Per MAR  . Lisinopril Other (See Comments) and Cough    Tired & fatigue  . Peach Flavor Itching  . Penicillins Itching    Has patient had a PCN reaction causing immediate rash, facial/tongue/throat swelling, SOB or lightheadedness with hypotension: No Has patient had a PCN reaction causing severe rash involving mucus membranes or skin necrosis: No Has patient had a PCN reaction that required hospitalization No Has patient had a PCN reaction occurring within the last 10 years: Yes If all of the above answers are "NO", then may proceed with Cephalosporin use.  tolerates cephalosporins OK   . Pravastatin Other (See Comments)    Myalgias-muscle pain  . Prednisolone Nausea Only and Other (See Comments)    Upset stomach  . Strawberry Flavor Itching  . Tramadol Hcl Other (See Comments)    headache  . Benadryl [Diphenhydramine] Itching and Palpitations    Family history: family history includes Diabetes in her mother; Hypertension in her father. She was adopted.  Prior to Admission medications   Medication Sig Start Date End Date Taking? Authorizing Provider  aspirin EC 325 MG tablet Take 325 mg by mouth daily.   Yes Historical Provider, MD  carvedilol (COREG) 25 MG tablet TAKE 1 TABLET TWICE DAILY WITH A MEAL Patient taking differently: Take 25 mg by mouth 2 (two) times daily with a meal.  11/29/15  Yes Tresa GarterAleksei V Plotnikov, MD  metFORMIN (GLUCOPHAGE) 500 MG tablet Take 1 tablet (500 mg total) by mouth 2 (two) times daily with a meal. 10/10/15  Yes  Tresa GarterAleksei V Plotnikov, MD  oxybutynin (DITROPAN) 5 MG tablet Take 1 tablet (5 mg total) by mouth 3 (three) times daily. Patient taking differently: Take 5 mg by mouth 2 (two) times daily.  05/01/16  Yes Tresa GarterAleksei V Plotnikov, MD  ACCU-CHEK SMARTVIEW test strip every morning. 06/07/15   Historical Provider, MD  acetaminophen (TYLENOL) 650 MG CR tablet Take 650 mg by mouth daily.    Historical Provider, MD  cholecalciferol (VITAMIN D) 1000 units tablet Take 1,000 Units by mouth daily.    Historical Provider, MD  DUREZOL 0.05 % EMUL Place 1 drop into the left eye 2 (two) times daily. 04/09/16   Historical Provider, MD  glucose blood (ONETOUCH VERIO)  test strip Use as instructed 08/12/15   Tresa Garter, MD  hydrALAZINE (APRESOLINE) 50 MG tablet Take 1 tablet (50 mg total) by mouth daily. Reported on 11/29/2015 11/29/15   [name] Plotnikov, MD  meclizine (ANTIVERT) 25 MG tablet Take 0.5-1 tablets (12.5-25 mg total) by mouth 3 (three) times daily as needed for dizziness. 06/14/16   Pricilla Loveless, MD  [facility] DELICA LANCETS FINE MISC 1 Device by Does not apply route daily as needed. 08/12/15   [name] Plotnikov, MD  vitamin B-12 (CYANOCOBALAMIN) 1000 MCG tablet Take 1,000 mcg by mouth daily.    Historical Provider, MD       Physical Exam: BP 163/92   Pulse 72   Resp 16   Ht 5\' 1"  (1.549 m)   Wt 63.5 kg (140 lb)   SpO2 95%   BMI 26.45 kg/m  General appearance: Frail elderly adult female, sedated and snoring, responds to tactile stimuli but does not wake for questions.   Eyes: Anicteric, conjunctiva pink, lids and lashes normal.  PERRL, 35mm.   ENT: No nasal deformity, discharge, or epistaxis.  OP moist without lesions.  Edentulous. Lymph: No cervical or supraclavicular lymphadenopathy. Skin: Warm and dry.  No jaundice.  No suspicious rashes or lesions. Cardiac: RRR, nl S1-S2, no murmurs appreciated.  Capillary refill is brisk.  JVP normal.  No LE edema.  Radial and DP pulses 2+ and  symmetric.Carotid pulses normal. Respiratory: Normal respiratory rate and rhythm.  CTAB without wheezes.  Bibasilar rales. GI: Abdomen soft without rigidity.  No ascites, distension, hepatosplenomegaly.   MSK: No deformities or effusions.  No clubbing/cyanosis. Neuro: Sedated, not following commands.  PERRL, pupils small but equal.   Psych: Unable to assess.    Labs on Admission:  I have personally reviewed following labs and imaging studies: CBC:  Recent Labs Lab 07/17/16 0052 07/17/16 0111  WBC 6.7  --   NEUTROABS 5.0  --   HGB 8.9* 9.9*  HCT 28.8* 29.0*  MCV 95.7  --   PLT 252  --    Basic Metabolic Panel:  Recent Labs Lab 07/17/16 0052 07/17/16 0111  NA 135 139  K 4.3 4.4  CL 107 106  CO2 18*  --   GLUCOSE 206* 209*  BUN 21* 23*  CREATININE 1.10* 1.00  CALCIUM 9.2  --    GFR: Estimated Creatinine Clearance: 37.7 mL/min (by C-G formula based on SCr of 1 mg/dL).  Liver Function Tests:  Recent Labs Lab 07/17/16 0052  AST 22  ALT 10*  ALKPHOS 57  BILITOT 0.5  PROT 7.2  ALBUMIN 3.6    Coagulation Profile:  Recent Labs Lab 07/17/16 0052  INR 1.09   CBG:  Recent Labs Lab 07/17/16 0109  GLUCAP 197*     Radiological Exams on Admission: Personally reviewed: Dg Chest Port 1 View  Result Date: 07/17/2016 CLINICAL DATA:  Acute onset of seizure. Unresponsive. Initial encounter. EXAM: PORTABLE CHEST 1 VIEW COMPARISON:  Chest radiograph performed 04/12/2015 FINDINGS: The lungs are well-aerated. Vascular congestion is noted. Bibasilar airspace opacities may reflect atelectasis or possibly mild pneumonia. There is no evidence of pleural effusion or pneumothorax. The cardiomediastinal silhouette is borderline enlarged. No acute osseous abnormalities are seen. IMPRESSION: Vascular congestion and borderline cardiomegaly. Bibasilar airspace opacities may reflect atelectasis or possibly mild pneumonia. Electronically Signed   By: Roanna Raider M.D.   On:  07/17/2016 01:29   Ct Head Code Stroke W/o Cm  Result Date: 07/17/2016 CLINICAL DATA:  Code  stroke. Initial evaluation for acute altered mental status. EXAM: CT HEAD WITHOUT CONTRAST TECHNIQUE: Contiguous axial images were obtained from the base of the skull through the vertex without intravenous contrast. COMPARISON:  Recent CT and MRI from 06/14/2016. FINDINGS: Scalp soft tissues demonstrate no acute abnormality. Globes and orbits demonstrate no acute abnormality. Patient is status post lens extraction on the left. Mild scattered opacity within the ethmoidal air cells. Paranasal sinuses are otherwise clear. No mastoid effusion. Calvarium intact. Stable atrophy with chronic microvascular ischemic disease. Remote lacunar infarct versus dilated perivascular space within the left lentiform nucleus. No acute intracranial hemorrhage. No acute large vessel territory infarct. Gray-white matter differentiation maintained. Deep gray nuclei maintained. No hyperdense vessel. No mass lesion, midline shift, or mass effect. No hydrocephalus. No extra-axial fluid collection. Empty sella again noted. Radiology stomach without an obscured hand discal about the patient patellar I do not see a see a neuro ASPECTS (Alberta Stroke Program Early CT Score) - Ganglionic level infarction (caudate, lentiform nuclei, internal capsule, insula, M1-M3 cortex): 7 - Supraganglionic infarction (M4-M6 cortex): 3 Total score (0-10 with 10 being normal): 10 IMPRESSION: 1. No acute intracranial infarct or other process identified. 2. ASPECTS is 10 3. Stable atrophy with chronic microvascular ischemic disease. Critical Value/emergent results were called by telephone at the time of interpretation on 07/17/2016 at 1:20 am to Dr. Roseanne Reno , who verbally acknowledged these results. Electronically Signed   By: Rise Mu M.D.   On: 07/17/2016 01:22    EKG: Independently reviewed. Rate 73, QTc 462, RBBB, no change from  previous.    Assessment/Plan 1. Seizure:  Unclear precipitant.  No previous seizure.  Electrolytes normal, no new medications, on no medications to lower seizure threshold.  No clear source of infection (CXR opacities are favored to be edema, but will check procalcitonin).  No hypoglycemia per EMS and only on metformin.  No alcohol use per family.   -Seizure precautions -Admit to SDU -Consult to Neurology, appreciate cares -Continue Keppra 500 BID -EEG ordered -MRI brain ordered with and without contrast -Check procalcitonin    2. Acute diastolic CHF:  EF 60% 1 year ago.  Hypertensive urgency with mild pulmonary edema at admission.  No hypoxia. -Check BNP -Furosemide 40 mg once -Repeat BMP -Strict ins and outs  3. Essential hypertension with hypertensive urgency:  On carvedilol at home only. Hypertensive admission, resolved to 160/90 without intervention. -Hydralazine when necessary for SBP greater than 210 or DBP greater than 120 -Restart carvedilol when able to take by mouth  4. Anemia, normocytic:  Stable, at baseline 8-10 g/dL.  5. NIDDM:  -Hold her metformin -Low dose sliding scale with meals  6. Previous stroke: -Continue aspirin 325 daily  7. Urinary incontinence: -Restart oxybutynin when able to take by mouth    DVT prophylaxis: Lovenox  Code Status: FULL  Family Communication: Daughter, son and husband at bedside.  An opportunity for questions was given and all questions were answered.  Disposition Plan: Anticipate MRI brain, EEG, and monitoring.  Further disposition per Neurology.   Consults called: Neuro, Dr. Roseanne Reno has evaluated the patient Admission status: INPATIENT, stepdown    Medical decision making: Patient seen at 2:35 AM on 07/17/2016.  The patient was discussed with Dr. Mora Bellman. What exists of the patient's chart was reviewed in depth.  Clinical condition: post-ictal and sedated, hemodynamically and respiratory status stable.         Alberteen Sam [facility] Pager [phone]

## 2016-07-17 NOTE — ED Notes (Signed)
Pt coming form home. Pt LSN 2300. Pt called out to family at 5. Pt stated to family that she did not feel well. EMS describes pt as being found in a "planking" position int he chair. Pt was non responsive upon arrival. Pt can't follow commands. Pt stating "i can't move" upon arrival to ER. Pt scooting down in the stretcher. EMS states they were able to stand the pt and shuffle her to the stretcher. Pt grabbing out for things while on the EMS truck. Pt altered and not following commands for EMS. PT following commands upon arrival to ER. Pt had a ran between the legs. Pt no on blood thinners.    236/118 72 HR 99%Ra 16R 185CBG

## 2016-07-17 NOTE — Progress Notes (Signed)
EEG Completed; Results Pending  

## 2016-07-17 NOTE — Care Management Note (Signed)
Case Management Note  Patient Details  Name: LILLYAHNA HEMBERGER MRN: [id number] Date of Birth: [birth year]  Subjective/Objective:    Patient presents with seizure, Neuro consulted, Patient lives with her spouse and her daughter Lambert Keto.  She has a rollator and a cane at home.  Patient will need pt/ot eval.  NCM will cont to follow for dc needs.                 Action/Plan:   Expected Discharge Date:                  Expected Discharge Plan:  Home w Home Health Services  In-House Referral:     Discharge planning Services  CM Consult  Post Acute Care Choice:    Choice offered to:     DME Arranged:    DME Agency:     HH Arranged:    HH Agency:     Status of Service:  In process, will continue to follow  If discussed at Long Length of Stay Meetings, dates discussed:    Additional Comments:  Leone Haven, RN 07/17/2016, 4:28 PM

## 2016-07-18 ENCOUNTER — Inpatient Hospital Stay (HOSPITAL_COMMUNITY): Payer: Commercial Managed Care - HMO

## 2016-07-18 DIAGNOSIS — I509 Heart failure, unspecified: Secondary | ICD-10-CM

## 2016-07-18 LAB — GLUCOSE, CAPILLARY
GLUCOSE-CAPILLARY: 88 mg/dL (ref 65–99)
GLUCOSE-CAPILLARY: 83 mg/dL (ref 65–99)
Glucose-Capillary: 73 mg/dL (ref 65–99)
Glucose-Capillary: 126 mg/dL — ABNORMAL HIGH (ref 65–99)

## 2016-07-18 LAB — BASIC METABOLIC PANEL
ANION GAP: 15 (ref 5–15)
BUN: 20 mg/dL (ref 6–20)
CHLORIDE: 103 mmol/L (ref 101–111)
CO2: 23 mmol/L (ref 22–32)
Calcium: 8.7 mg/dL — ABNORMAL LOW (ref 8.9–10.3)
Creatinine, Ser: 0.92 mg/dL (ref 0.44–1.00)
GFR calc non Af Amer: 57 mL/min — ABNORMAL LOW (ref >60)
GLUCOSE: 79 mg/dL (ref 65–99)
POTASSIUM: 3.5 mmol/L (ref 3.5–5.1)
Sodium: 141 mmol/L (ref 135–145)

## 2016-07-18 LAB — ECHOCARDIOGRAM COMPLETE
HEIGHTINCHES: 62 in
Weight: 2400 oz

## 2016-07-18 MED ORDER — CARVEDILOL 12.5 MG PO TABS
25.0000 mg | ORAL_TABLET | Freq: Two times a day (BID) | ORAL | Status: DC
  Administered 2016-07-18 – 2016-07-19 (×2): 25 mg via ORAL
  Filled 2016-07-18: qty 1
  Filled 2016-07-18: qty 2

## 2016-07-18 MED ORDER — OXYBUTYNIN CHLORIDE 5 MG PO TABS
5.0000 mg | ORAL_TABLET | Freq: Two times a day (BID) | ORAL | Status: DC
  Administered 2016-07-18 – 2016-07-19 (×2): 5 mg via ORAL
  Filled 2016-07-18 (×3): qty 1

## 2016-07-18 MED ORDER — VITAMIN B-12 1000 MCG PO TABS
1000.0000 ug | ORAL_TABLET | Freq: Every day | ORAL | Status: DC
  Administered 2016-07-19: 1000 ug via ORAL
  Filled 2016-07-18: qty 1

## 2016-07-18 MED ORDER — ASPIRIN EC 325 MG PO TBEC: 325.0000 mg | DELAYED_RELEASE_TABLET | Freq: Every day | ORAL | Status: DC

## 2016-07-18 MED ORDER — ASPIRIN EC 325 MG PO TBEC
325.0000 mg | DELAYED_RELEASE_TABLET | Freq: Every day | ORAL | Status: DC
  Administered 2016-07-19: 325 mg via ORAL
  Filled 2016-07-18: qty 1

## 2016-07-18 MED ORDER — LEVETIRACETAM 500 MG PO TABS
500.0000 mg | ORAL_TABLET | Freq: Two times a day (BID) | ORAL | Status: DC
  Administered 2016-07-18 – 2016-07-19 (×2): 500 mg via ORAL
  Filled 2016-07-18 (×2): qty 1

## 2016-07-18 NOTE — Progress Notes (Signed)
[facility] TEAM 1 - Stepdown/ICU TEAM  [name]  ZHG:[id number] DOB: 07-03-1933 DOA: 07/17/2016 PCP: [name], MD    Brief Narrative:  82 y.o. female with a history of TIA/stroke, DM, HTN, anemia, and SBO 1 year ago requiring laparotomy who presented following a seizure.  Daughter reported the patient had been her normal self but when getting ready for bed felt "my sugar is low". Her daughter fetched her some orange juice.  10 minutes she found her mother lying down, unresponsive, with her mouth twitching.  The daughter tried to sit her up and noted her body was stiff, so she called 9-1-1.  EMS found the patient with normal glucose but lethargic and confused and activated CODE STROKE.  Subjective: The patient is resting comfortably in bed.  She denies headache shortness breath fevers chills nausea vomiting or abdominal pain.  She is alert and oriented and conversant.  Assessment & Plan:  Seizure Unclear precipitant - no hx of seizure - continue Keppra 500 BID - MRI showed no acute abnormality - EEG showed no epileptiform activity  Acute exac of Chronic Diastolic CHF  EF 19% 1 year ago - 50% this admit w/ grade 1 DD - mild pulmonary edema noted at admission   Essential hypertension with hypertensive urgency  BP improved but not yet at goal - adjust and follow   Anemia, normocytic  Hgb stable at baseline of 8-10  DM2 CBG well controlled   Hx of CVA Continue aspirin 325 daily  Urinary incontinence Restart oxybutynin    DVT prophylaxis: lovenox  Code Status: FULL CODE Family Communication: no family present at time of exam  Disposition Plan: Transfer to neuro bed - PT/OT - ambulate  Consultants:  Neurology   Procedures: EEG TTE  Antimicrobials:  none  Objective: Blood pressure (!) 154/65, pulse 65, temperature 98.3 F (36.8 C), temperature source Oral, resp. rate 15, height 5\' 2"  (1.575 m), weight 68 kg (150 lb), SpO2 100 %.  Intake/Output Summary  (Last 24 hours) at 07/18/16 1703 Last data filed at 07/18/16 0846  Gross per 24 hour  Intake              285 ml  Output                0 ml  Net              285 ml   Filed Weights   07/17/16 0149 07/17/16 0423 07/18/16 0718  Weight: 63.5 kg (140 lb) 70.1 kg (154 lb 8.7 oz) 68 kg (150 lb)    Examination: General: No acute respiratory distress Lungs: Clear to auscultation bilaterally without wheezes or crackles Cardiovascular: Regular rate and rhythm without murmur gallop or rub normal S1 and S2 Abdomen: Nontender, nondistended, soft, bowel sounds positive, no rebound, no ascites, no appreciable mass Extremities: No significant cyanosis, clubbing, or edema bilateral lower extremities  CBC:  Recent Labs Lab 07/17/16 0052 07/17/16 0111  WBC 6.7  --   NEUTROABS 5.0  --   HGB 8.9* 9.9*  HCT 28.8* 29.0*  MCV 95.7  --   PLT 252  --    Basic Metabolic Panel:  Recent Labs Lab 07/17/16 0052 07/17/16 0111 07/18/16 0506  NA 135 139 141  K 4.3 4.4 3.5  CL 107 106 103  CO2 18*  --  23  GLUCOSE 206* 209* 79  BUN 21* 23* 20  CREATININE 1.10* 1.00 0.92  CALCIUM 9.2  --  8.7*  GFR: Estimated Creatinine Clearance: 43.4 mL/min (by C-G formula based on SCr of 0.92 mg/dL).  Liver Function Tests:  Recent Labs Lab 07/17/16 0052  AST 22  ALT 10*  ALKPHOS 57  BILITOT 0.5  PROT 7.2  ALBUMIN 3.6    Coagulation Profile:  Recent Labs Lab 07/17/16 0052  INR 1.09    HbA1C: Hgb A1c MFr Bld  Date/Time Value Ref Range Status  02/28/2016 03:59 PM 6.2 4.6 - 6.5 % Final    Comment:    Glycemic Control Guidelines for People with Diabetes:Non Diabetic:  <6%Goal of Therapy: <7%Additional Action Suggested:  >8%   08/12/2015 05:07 PM 5.3 4.6 - 6.5 % Final    Comment:    Glycemic Control Guidelines for People with Diabetes:Non Diabetic:  <6%Goal of Therapy: <7%Additional Action Suggested:  >8%     CBG:  Recent Labs Lab 07/17/16 1305 07/17/16 1645 07/17/16 2112  07/18/16 0744 07/18/16 1230  GLUCAP 101* 66 95 73 88    Recent Results (from the past 240 hour(s))  MRSA PCR Screening     Status: None   Collection Time: 07/17/16  4:26 AM  Result Value Ref Range Status   MRSA by PCR NEGATIVE NEGATIVE Final    Comment:        The GeneXpert MRSA Assay (FDA approved for NASAL specimens only), is one component of a comprehensive MRSA colonization surveillance program. It is not intended to diagnose MRSA infection nor to guide or monitor treatment for MRSA infections.      Scheduled Meds: . aspirin  300 mg Rectal Daily   Or  . aspirin  325 mg Oral Daily  . carvedilol  12.5 mg Oral BID WC  . enoxaparin (LOVENOX) injection  40 mg Subcutaneous Q24H  . insulin aspart  0-5 Units Subcutaneous QHS  . insulin aspart  0-9 Units Subcutaneous TID WC  . levETIRAcetam  500 mg Intravenous Q12H  . oxybutynin  5 mg Oral BID     LOS: 1 day   Lonia Blood, MD [facility] Office  [phone] Pager - Text Page per Amion as per below:  On-Call/Text Page:      [email or web address]      password [email or web address]  If 7PM-7AM, please contact night-coverage [email or web address] Password [email or web address] 07/18/2016, 5:03 PM

## 2016-07-18 NOTE — Progress Notes (Signed)
  Echocardiogram 2D Echocardiogram has been performed.  [name] 07/18/2016, 11:31 AM

## 2016-07-18 NOTE — Progress Notes (Signed)
Subjective: Patient has had no further seizures overnight and is tolerating Keppra well. Patient has no history of seizure. MRI with and without contrast showed no acute abnormalities. EEG showed no epileptiform activity.  Exam: Vitals:   07/18/16 0352 07/18/16 0718  BP: 113/62 (!) 159/99  Pulse: 86 92  Resp: (!) 22 20  Temp: 98 F (36.7 C) 98.4 F (36.9 C)     Gen: In bed, NAD MS: Patient is alert she is oriented, able to follow commands, speech is clear, CN: Cranial nerves II through XII are grossly intact Motor: Moving all extremities well Sensory: Sensation grossly intact   Pertinent Labs/Diagnostics: MRI and EEG as above  Felicie Morn PA-C [facility] [phone]  Impression: This is an 82 year old female with multiple medical problems presenting with new on seizure activity. Patient has been started on Keppra with no further seizures. At this point she is tolerating Keppra 500 mg every 12 hours without difficulty. Patient will need further follow-up as an outpatient with neurology. Would recommend her following with outpatient neurology in 2 weeks.   Neurology will sign off at this time   07/18/2016, 9:48 AM

## 2016-07-19 DIAGNOSIS — E1121 Type 2 diabetes mellitus with diabetic nephropathy: Secondary | ICD-10-CM

## 2016-07-19 LAB — COMPREHENSIVE METABOLIC PANEL
ALT: 9 U/L — ABNORMAL LOW (ref 14–54)
AST: 20 U/L (ref 15–41)
Albumin: 3.2 g/dL — ABNORMAL LOW (ref 3.5–5.0)
Alkaline Phosphatase: 61 U/L (ref 38–126)
Anion gap: 8 (ref 5–15)
BILIRUBIN TOTAL: 0.5 mg/dL (ref 0.3–1.2)
BUN: 20 mg/dL (ref 6–20)
CHLORIDE: 107 mmol/L (ref 101–111)
CO2: 26 mmol/L (ref 22–32)
Calcium: 9.1 mg/dL (ref 8.9–10.3)
Creatinine, Ser: 0.85 mg/dL (ref 0.44–1.00)
Glucose, Bld: 85 mg/dL (ref 65–99)
POTASSIUM: 3.8 mmol/L (ref 3.5–5.1)
Sodium: 141 mmol/L (ref 135–145)
TOTAL PROTEIN: 7.2 g/dL (ref 6.5–8.1)

## 2016-07-19 LAB — CBC
HEMATOCRIT: 31.6 % — AB (ref 36.0–46.0)
Hemoglobin: 9.5 g/dL — ABNORMAL LOW (ref 12.0–15.0)
MCH: 28.9 pg (ref 26.0–34.0)
MCHC: 30.1 g/dL (ref 30.0–36.0)
MCV: 96 fL (ref 78.0–100.0)
PLATELETS: 255 10*3/uL (ref 150–400)
RBC: 3.29 MIL/uL — AB (ref 3.87–5.11)
RDW: 13.3 % (ref 11.5–15.5)
WBC: 5.4 10*3/uL (ref 4.0–10.5)

## 2016-07-19 LAB — GLUCOSE, CAPILLARY
GLUCOSE-CAPILLARY: 73 mg/dL (ref 65–99)
GLUCOSE-CAPILLARY: 131 mg/dL — AB (ref 65–99)

## 2016-07-19 MED ORDER — ACETAMINOPHEN 325 MG PO TABS
650.0000 mg | ORAL_TABLET | Freq: Every day | ORAL | Status: DC
  Administered 2016-07-19: 650 mg via ORAL
  Filled 2016-07-19: qty 2

## 2016-07-19 MED ORDER — OXYBUTYNIN CHLORIDE 5 MG PO TABS: 5.0000 mg | ORAL_TABLET | Freq: Two times a day (BID) | ORAL | Status: DC

## 2016-07-19 MED ORDER — VITAMIN D 1000 UNITS PO TABS
1000.0000 [IU] | ORAL_TABLET | Freq: Every day | ORAL | Status: DC
  Administered 2016-07-19: 1000 [IU] via ORAL
  Filled 2016-07-19: qty 1

## 2016-07-19 MED ORDER — LEVETIRACETAM 500 MG PO TABS: 500.0000 mg | ORAL_TABLET | Freq: Two times a day (BID) | ORAL | 0 refills | Status: DC

## 2016-07-19 NOTE — Discharge Summary (Signed)
Physician Discharge Summary  [name] BXU:[id number] DOB: December 08, 1932  PCP: [name], MD  Admit date: 07/17/2016 Discharge date: 07/19/2016  Admitted From: Home Disposition:  Home  Recommendations for Outpatient Follow-up:  1. Dr. Jacinta Shoe, PCP: Patient advised to keep prior appointment on 07/31/2016. To be seen with repeat labs (CBC & BMP).  2. [facility] in 2 weeks. Request was sent from the hospital via Epic. 3. Metformin was discontinued during this hospitalization due to concern for recurrent hypoglycemia. Reassess during outpatient follow-up with PCP. 4. Outpatient speech therapy consultation and follow-up. 5. Patient has been advised that she should not drive until cleared by physician during outpatient follow-up. She states that she has already been advised not to drive by her PCP and has not been doing so since sometime in 2016.  Home Health: PT & SLP Equipment/Devices: None    Discharge Condition: Improved and stable  CODE STATUS: Full  Diet recommendation: As per speech therapy recommendations as below:  Dysphagia 1 (Puree);Thin liquid   Liquid Administration via: Straw;Cup Medication Administration: Crushed with puree Supervision: Full supervision/cueing for compensatory strategies Compensations: Minimize environmental distractions;Slow rate;Small sips/bites Postural Changes: Seated upright at 90 degrees;Remain upright for at least 30 minutes after po intake   Discharge Diagnoses:  Principal Problem:   Seizures (HCC) Active Problems:   Type 2 diabetes mellitus with diabetic nephropathy, without long-term current use of insulin (HCC)   Anemia in other chronic diseases classified elsewhere   Essential hypertension   Hypertensive urgency   Acute diastolic CHF (congestive heart failure) (HCC)   Controlled diabetes mellitus type 2 with complications (HCC)   Brief/Interim Summary: 82 y.o.femalewith a history of TIA/stroke, DM, HTN, anemia,  and SBO 1 year ago requiring laparotomywho presented following a seizure.  Daughter reported the patient had been her normal self but when getting ready for bed felt "mysugar is low". Her daughter fetched her some orange juice. 10 minutes alter she found her mother lying down,unresponsive, with her mouth twitching.  The daughter tried to sit her up and notedher body was stiff, so she called 9-1-1. EMS found the patient with normal glucose but lethargic and confused and activated CODE STROKE.  Assessment & Plan:  Seizure Unclear precipitant - no hx of seizure - continue Keppra 500 BID - MRI showed no acute abnormality - EEG showed no epileptiform activity - ? Related to hypoglycemia which may have resolved by the time EMS got there because she had received some orange juice at home. - Neurology consultation and follow-up appreciated. They recommend neurology outpatient follow-up in 2 weeks.  Acute exac of Chronic Diastolic CHF EF 60% 1 year ago - 50% this admit w/ grade 1 DD - mild pulmonary edema noted at admission  - Clinically compensated and appears euvolemic. Was not on diuretics at home and will not be discharged on any diuretics. Close follow up as outpatient.  Essential hypertension with hypertensive urgency Mildly uncontrolled at times. Continue prior home dose of carvedilol at this time. Outpatient follow-up with PCP to determine need for adjustment.  Anemia, normocytic Hgb stable at baseline of 8-10. Outpatient follow-up.  DM2 CBG is too tightly controlled. She had 2 hypoglycemic episodes while hospitalized on 9/5 of 58 and 66 mg per DL. Hemoglobin A1c 02/28/16:6.2. Due to concern for hypoglycemia in this pleasant elderly female, discontinued metformin and recommend monitoring CBGs at home and outpatient follow-up to determine need for resuming oral hypoglycemics.  Hx of CVA Continue aspirin 325 daily  Urinary incontinence  Continue oxybutynin   Dysphagia - As per  speech therapy recommendations, continue modified diet as above and outpatient follow-up.   Discharge Instructions  Discharge Instructions    Ambulatory referral to Neurology    Complete by:  As directed   An appointment is requested in approximately: 2 weeks   Call MD for:    Complete by:  As directed   Seizure like activity.   Discharge instructions    Complete by:  As directed   Please check your fingerstick blood glucose at home 3 times daily before meals and at bedtime, maintain a diary of the same and take it to your physician during next appointment.   Discharge instructions    Complete by:  As directed   DIET: Dysphagia 1 (Puree);Thin liquid (Diabetic)  Liquid Administration via: Straw;Cup Medication Administration: Crushed with puree Supervision: Full supervision/cueing for compensatory strategies Compensations: Minimize environmental distractions;Slow rate;Small sips/bites Postural Changes: Seated upright at 90 degrees;Remain upright for at least 30 minutes after po intake   Driving Restrictions    Complete by:  As directed   Do not drive until cleared by your physician during outpatient follow-up.   Increase activity slowly    Complete by:  As directed       Medication List    STOP taking these medications   metFORMIN 500 MG tablet Commonly known as:  GLUCOPHAGE     TAKE these medications   acetaminophen 650 MG CR tablet Commonly known as:  TYLENOL Take 650 mg by mouth daily.   aspirin EC 325 MG tablet Take 325 mg by mouth daily.   carvedilol 25 MG tablet Commonly known as:  COREG TAKE 1 TABLET TWICE DAILY WITH A MEAL What changed:  how much to take  how to take this  when to take this  additional instructions   cholecalciferol 1000 units tablet Commonly known as:  VITAMIN D Take 1,000 Units by mouth daily.   glucose blood test strip Commonly known as:  ONETOUCH VERIO Use as instructed   levETIRAcetam 500 MG tablet Commonly known as:   KEPPRA Take 1 tablet (500 mg total) by mouth 2 (two) times daily.   meclizine 25 MG tablet Commonly known as:  ANTIVERT Take 0.5-1 tablets (12.5-25 mg total) by mouth 3 (three) times daily as needed for dizziness.   ONETOUCH DELICA LANCETS FINE Misc 1 Device by Does not apply route daily as needed.   oxybutynin 5 MG tablet Commonly known as:  DITROPAN Take 1 tablet (5 mg total) by mouth 2 (two) times daily.   vitamin B-12 1000 MCG tablet Commonly known as:  CYANOCOBALAMIN Take 1,000 mcg by mouth daily.      Follow-up Information    [name], MD. Schedule an appointment as soon as possible for a visit on 07/31/2016.   Specialty:  Internal Medicine Why:  9:30 AM. Keep prior appointment. To be seen with repeat labs (CBC & BMP). Contact information: [address] [place] [address] [phone]          Allergies  Allergen Reactions  . Amlodipine Besylate Other (See Comments)     dizzy  . Atenolol Other (See Comments)    Fatigue  . Benazepril Cough  . Benicar [Olmesartan Medoxomil] Other (See Comments)    HEADACHE  . Cozaar     nausea  . Hydralazine     Hair loss  . Hydrochlorothiazide W-Triamterene Other (See Comments)     dizzy  . Hydrocodone Other (See Comments)  HEADACHE  . Hydroxyzine Pamoate Other (See Comments)    Per MAR  . Iodine Other (See Comments)    Per MAR  . Lisinopril Other (See Comments) and Cough    Tired & fatigue  . Peach Flavor Itching  . Penicillins Itching    Has patient had a PCN reaction causing immediate rash, facial/tongue/throat swelling, SOB or lightheadedness with hypotension: No Has patient had a PCN reaction causing severe rash involving mucus membranes or skin necrosis: No Has patient had a PCN reaction that required hospitalization No Has patient had a PCN reaction occurring within the last 10 years: Yes If all of the above answers are "NO", then may proceed with Cephalosporin use.  tolerates cephalosporins OK    . Pravastatin Other (See Comments)    Myalgias-muscle pain  . Prednisolone Nausea Only and Other (See Comments)    Upset stomach  . Strawberry Flavor Itching  . Tramadol Hcl Other (See Comments)    headache  . Benadryl [Diphenhydramine] Itching and Palpitations    Consultations:  Neurology   Procedures/Studies: Mr Laqueta Jean Wo Contrast  Result Date: 07/17/2016 CLINICAL DATA:  Seizure EXAM: MRI HEAD WITHOUT AND WITH CONTRAST TECHNIQUE: Multiplanar, multiecho pulse sequences of the brain and surrounding structures were obtained without and with intravenous contrast. CONTRAST:  44mL MULTIHANCE GADOBENATE DIMEGLUMINE 529 MG/ML IV SOLN COMPARISON:  CT head 07/17/2016.  MRI 01/13/2016 FINDINGS: Mild atrophy.  Negative for hydrocephalus. Negative for acute infarct. Small chronic infarct right cerebellum. Benign cyst left basal ganglia unchanged. For negative for intracranial hemorrhage or fluid collection. Negative for mass or edema.  No shift of the midline structures. Postcontrast imaging reveals enhancing vessel in the left basal ganglia which is prominent but probably not pathologic. No enhancing mass lesion. Leptomeningeal enhancement normal. Paranasal sinuses clear.  Normal orbit.  Empty sella unchanged. IMPRESSION: No acute abnormality and no change from prior studies. Electronically Signed   By: Marlan Palau M.D.   On: 07/17/2016 12:48   Dg Chest Port 1 View  Result Date: 07/17/2016 CLINICAL DATA:  Acute onset of seizure. Unresponsive. Initial encounter. EXAM: PORTABLE CHEST 1 VIEW COMPARISON:  Chest radiograph performed 04/12/2015 FINDINGS: The lungs are well-aerated. Vascular congestion is noted. Bibasilar airspace opacities may reflect atelectasis or possibly mild pneumonia. There is no evidence of pleural effusion or pneumothorax. The cardiomediastinal silhouette is borderline enlarged. No acute osseous abnormalities are seen. IMPRESSION: Vascular congestion and borderline cardiomegaly.  Bibasilar airspace opacities may reflect atelectasis or possibly mild pneumonia. Electronically Signed   By: Roanna Raider M.D.   On: 07/17/2016 01:29   Ct Head Code Stroke W/o Cm  Result Date: 07/17/2016 CLINICAL DATA:  Code stroke. Initial evaluation for acute altered mental status. EXAM: CT HEAD WITHOUT CONTRAST TECHNIQUE: Contiguous axial images were obtained from the base of the skull through the vertex without intravenous contrast. COMPARISON:  Recent CT and MRI from 06/14/2016. FINDINGS: Scalp soft tissues demonstrate no acute abnormality. Globes and orbits demonstrate no acute abnormality. Patient is status post lens extraction on the left. Mild scattered opacity within the ethmoidal air cells. Paranasal sinuses are otherwise clear. No mastoid effusion. Calvarium intact. Stable atrophy with chronic microvascular ischemic disease. Remote lacunar infarct versus dilated perivascular space within the left lentiform nucleus. No acute intracranial hemorrhage. No acute large vessel territory infarct. Gray-white matter differentiation maintained. Deep gray nuclei maintained. No hyperdense vessel. No mass lesion, midline shift, or mass effect. No hydrocephalus. No extra-axial fluid collection. Empty sella again noted. Radiology stomach without  an obscured hand discal about the patient patellar I do not see a see a neuro ASPECTS (Alberta Stroke Program Early CT Score) - Ganglionic level infarction (caudate, lentiform nuclei, internal capsule, insula, M1-M3 cortex): 7 - Supraganglionic infarction (M4-M6 cortex): 3 Total score (0-10 with 10 being normal): 10 IMPRESSION: 1. No acute intracranial infarct or other process identified. 2. ASPECTS is 10 3. Stable atrophy with chronic microvascular ischemic disease. Critical Value/emergent results were called by telephone at the time of interpretation on 07/17/2016 at 1:20 am to Dr. Roseanne Reno , who verbally acknowledged these results. Electronically Signed   By: Rise Mu M.D.   On: 07/17/2016 01:22      Subjective: Patient complains of chronic bilateral knee pain for which she takes Tylenol daily at home. Denies any other complaints. As per RN, no further seizure-like activity reported. She states that she has not been driving as per her physician's advice since 2016.  Discharge Exam:  Vitals:   07/18/16 2114 07/19/16 0144 07/19/16 0516 07/19/16 0925  BP: (!) 151/57 (!) 148/59 (!) 174/59 (!) 183/63  Pulse: 60 62 64 70  Resp: 18 18 18 20   Temp: 98.3 F (36.8 C) 98.1 F (36.7 C) 98 F (36.7 C) 99 F (37.2 C)  TempSrc: Oral Oral Oral Oral  SpO2: 100% 100% 100% 98%  Weight: 68.9 kg (151 lb 12.8 oz)     Height: 5\' 5"  (1.651 m)       General: Pt lying comfortably in bed & appears in no obvious distress. Cardiovascular: S1 & S2 heard, RRR, S1/S2 +. No murmurs, rubs, gallops or clicks. No JVD or pedal edema.Telemetry: SB in the 50s-SR. Respiratory: Clear to auscultation without wheezing, rhonchi or crackles. No increased work of breathing. Abdominal:  Non distended, non tender & soft. No organomegaly or masses appreciated. Normal bowel sounds heard. CNS: Alert and oriented. No focal deficits. Extremities: no edema, no cyanosis. Bilateral knees with mild chronic boggy swelling without acute findings.    The results of significant diagnostics from this hospitalization (including imaging, microbiology, ancillary and laboratory) are listed below for reference.     Microbiology: Recent Results (from the past 240 hour(s))  MRSA PCR Screening     Status: None   Collection Time: 07/17/16  4:26 AM  Result Value Ref Range Status   MRSA by PCR NEGATIVE NEGATIVE Final    Comment:        The GeneXpert MRSA Assay (FDA approved for NASAL specimens only), is one component of a comprehensive MRSA colonization surveillance program. It is not intended to diagnose MRSA infection nor to guide or monitor treatment for MRSA infections.       Labs: BNP (last 3 results)  Recent Labs  07/17/16 0052  BNP 344.7*   Basic Metabolic Panel:  Recent Labs Lab 07/17/16 0052 07/17/16 0111 07/18/16 0506 07/19/16 0241  NA 135 139 141 141  K 4.3 4.4 3.5 3.8  CL 107 106 103 107  CO2 18*  --  23 26  GLUCOSE 206* 209* 79 85  BUN 21* 23* 20 20  CREATININE 1.10* 1.00 0.92 0.85  CALCIUM 9.2  --  8.7* 9.1   Liver Function Tests:  Recent Labs Lab 07/17/16 0052 07/19/16 0241  AST 22 20  ALT 10* 9*  ALKPHOS 57 61  BILITOT 0.5 0.5  PROT 7.2 7.2  ALBUMIN 3.6 3.2*   No results for input(s): LIPASE, AMYLASE in the last 168 hours. No results for input(s): AMMONIA in the last  168 hours. CBC:  Recent Labs Lab 07/17/16 0052 07/17/16 0111 07/19/16 0241  WBC 6.7  --  5.4  NEUTROABS 5.0  --   --   HGB 8.9* 9.9* 9.5*  HCT 28.8* 29.0* 31.6*  MCV 95.7  --  96.0  PLT 252  --  255   Cardiac Enzymes: No results for input(s): CKTOTAL, CKMB, CKMBINDEX, TROPONINI in the last 168 hours. BNP: Invalid input(s): POCBNP CBG:  Recent Labs Lab 07/18/16 1230 07/18/16 1711 07/18/16 2113 07/19/16 0619 07/19/16 1152  GLUCAP 88 83 126* 73 131*    Urinalysis    Component Value Date/Time   COLORURINE STRAW (A) 07/17/2016 0218   APPEARANCEUR CLEAR 07/17/2016 0218   LABSPEC 1.014 07/17/2016 0218   LABSPEC 1.020 04/23/2006 1313   PHURINE 6.0 07/17/2016 0218   GLUCOSEU 100 (A) 07/17/2016 0218   GLUCOSEU NEGATIVE 02/28/2016 1559   HGBUR SMALL (A) 07/17/2016 0218   BILIRUBINUR NEGATIVE 07/17/2016 0218   BILIRUBINUR Negative 04/23/2006 1313   KETONESUR NEGATIVE 07/17/2016 0218   PROTEINUR 100 (A) 07/17/2016 0218   UROBILINOGEN 0.2 02/28/2016 1559   NITRITE NEGATIVE 07/17/2016 0218   LEUKOCYTESUR NEGATIVE 07/17/2016 0218   LEUKOCYTESUR Trace 04/23/2006 1313    Time coordinating discharge: Over 30 minutes  SIGNED:  Marcellus Scott, MD, FACP, FHM. [facility] Pager [phone]  If 7PM-7AM, please contact  night-coverage [email or web address] Password [email or web address] 07/19/2016, 1:22 PM

## 2016-07-19 NOTE — Care Management Note (Signed)
Case Management Note  Patient Details  Name: [name] MRN: [id number] Date of Birth: [birth year]  Subjective/Objective:                    Action/Plan: Pt discharging home with orders for [facility] services. CM met with the patient and provided her a list of [place] agencies in the [place] area. The patients daughter selected [facility]. Karolee Stamps with [facility] notified and accepted the referral. Bedside RN updated.   Expected Discharge Date:                  Expected Discharge Plan:  [place]  In-House Referral:     Discharge planning Services  CM Consult  Post Acute Care Choice:    Choice offered to:  Patient, Adult Children  DME Arranged:    DME Agency:     HH Arranged:  PT [place]:  [place]  Status of Service:  Completed, signed off  If discussed at [place] of Stay Meetings, dates discussed:    Additional Comments:  Pollie Friar, RN 07/19/2016, 1:42 PM

## 2016-07-19 NOTE — Evaluation (Signed)
Physical Therapy Evaluation Patient Details Name: [name] MRN: [id number] DOB: May 09, 1933 Today's Date: 07/19/2016   History of Present Illness  Patient is a 82 y/o female with hx of CAD, HTN, HLD, DM, back pain and osteoporosis found unresponsive at home. Admitted for new onset of seizures. Started on Keppra.  Head CT, MRI, EEG- unremarkable.  Clinical Impression  Patient presents with generalized weakness, balance deficits, and pain s/p above. PTA, pt Mod I with SPC vs RW and able to perform ADLs. Pt now requires assist for standing and ambulation due to weakness and pain in knees. Fatigues easily during gait training. Anticipate with increased activity, mobility and strength will improve. Pt has support from daughter and spouse at home. Will follow acutely to maximize independence and mobility prior to return home.    Follow Up Recommendations Home health PT;Supervision for mobility/OOB;Supervision/Assistance - 24 hour    Equipment Recommendations  None recommended by PT    Recommendations for Other Services OT consult     Precautions / Restrictions Precautions Precautions: Fall Restrictions Weight Bearing Restrictions: No      Mobility  Bed Mobility Overal bed mobility: Needs Assistance Bed Mobility: Supine to Sit     Supine to sit: HOB elevated;Min assist     General bed mobility comments: Increased time to get to EOB with heavy use of rail.   Transfers Overall transfer level: Needs assistance Equipment used: Rolling walker (2 wheeled) Transfers: Sit to/from Stand Sit to Stand: Mod assist         General transfer comment: Assist to power to standing with cues for hand placement/technique. Difficulty with standing due to knee pain. Cues for upright posture.  Ambulation/Gait Ambulation/Gait assistance: Min assist Ambulation Distance (Feet): 80 Feet Assistive device: Rolling walker (2 wheeled) Gait Pattern/deviations: Step-through pattern;Decreased stride  length;Shuffle;Trunk flexed Gait velocity: decreased Gait velocity interpretation: <1.8 ft/sec, indicative of risk for recurrent falls General Gait Details: Slow, mildly unsteady gait. Cues for upright posture. Fatigues quickly. Difficulty navigating RW and running into wall on left.   Stairs            Wheelchair Mobility    Modified Rankin (Stroke Patients Only)       Balance Overall balance assessment: Needs assistance Sitting-balance support: Feet supported;No upper extremity supported Sitting balance-Leahy Scale: Fair     Standing balance support: During functional activity Standing balance-Leahy Scale: Poor Standing balance comment: Reliant on BUEs for support.                              Pertinent Vitals/Pain Pain Assessment: Faces Faces Pain Scale: Hurts even more Pain Location: knees with mobility, left>Rt Pain Descriptors / Indicators: Aching;Sore Pain Intervention(s): Monitored during session;Repositioned    Home Living Family/patient expects to be discharged to:: Private residence Living Arrangements: Spouse/significant other;Children Available Help at Discharge: Family Type of Home: House Home Access: Stairs to enter Entrance Stairs-Rails: Right Entrance Stairs-Number of Steps: 4 Home Layout: Two level;Bed/bath upstairs Home Equipment: Walker - 4 wheels;Shower seat;Cane - single point      Prior Function Level of Independence: Needs assistance   Gait / Transfers Assistance Needed: Reports using stick vs rollator for mobility.   ADL's / Homemaking Assistance Needed: Does bird baths but wants to be able to get into shower. Does some cooking, cleaning, gardening.        Hand Dominance   Dominant Hand: Right    Extremity/Trunk Assessment   Upper  Extremity Assessment: Defer to OT evaluation           Lower Extremity Assessment: Generalized weakness (Stiffness noted in BLEs, esp in knees- hx of arthritis.)      Cervical /  Trunk Assessment: Kyphotic  Communication   Communication: No difficulties  Cognition Arousal/Alertness: Awake/alert Behavior During Therapy: WFL for tasks assessed/performed Overall Cognitive Status: Within Functional Limits for tasks assessed                      General Comments      Exercises        Assessment/Plan    PT Assessment Patient needs continued PT services  PT Diagnosis Difficulty walking;Acute pain;Generalized weakness   PT Problem List Decreased strength;Decreased mobility;Decreased activity tolerance;Pain;Decreased balance  PT Treatment Interventions Therapeutic activities;Gait training;Therapeutic exercise;Balance training;Functional mobility training;DME instruction;Patient/family education;Stair training;Neuromuscular re-education   PT Goals (Current goals can be found in the Care Plan section) Acute Rehab PT Goals Patient Stated Goal: to go home PT Goal Formulation: With patient Time For Goal Achievement: 08/02/16 Potential to Achieve Goals: Good    Frequency Min 4X/week   Barriers to discharge Inaccessible home environment stairs to get into home and to bedroom    Co-evaluation               End of Session Equipment Utilized During Treatment: Gait belt Activity Tolerance: Patient limited by fatigue Patient left: in chair;with call bell/phone within reach (OT present in room.) Nurse Communication: Mobility status         Time: 1046-1106 PT Time Calculation (min) (ACUTE ONLY): 20 min   Charges:   PT Evaluation $PT Eval Moderate Complexity: 1 Procedure     PT G Codes:        Jerome Otter A Chimere Klingensmith 07/19/2016, 11:33 AM Mylo Red, PT, DPT [phone]

## 2016-07-19 NOTE — Evaluation (Signed)
Occupational Therapy Evaluation Patient Details Name: [name] MRN: [id number] DOB: [birth year] Today's Date: 07/19/2016    History of Present Illness Patient is a 82 y/o female with hx of CAD, HTN, HLD, DM, back pain and osteoporosis found unresponsive at home. Admitted for new onset of seizures. Started on Keppra.  Head CT, MRI, EEG- unremarkable.   Clinical Impression   Pt reports she was managing ADL independently PTA. Currently pt requires min assist overall for ADL in sitting. Pt unable to perform sit to stand from chair today due to bil knee pain and fatigue from just working with PT. Pt presenting with generalized weakness and deconditioning impacting her independence and safety with ADL and functional mobility. Pt planning to d/c home with family support. Recommending HHOT for follow up to maximize independence and safety with ADL and functional mobility upon return home. Pt would benefit from continued skilled OT to address established goals.    Follow Up Recommendations  Home health OT;Supervision/Assistance - 24 hour    Equipment Recommendations  None recommended by OT    Recommendations for Other Services       Precautions / Restrictions Precautions Precautions: Fall Restrictions Weight Bearing Restrictions: No      Mobility Bed Mobility      General bed mobility comments: Pt OOB in chair upon arrival.  Transfers          General transfer comment: Attempted sit to stand from chair with max assist and use of RW; pt unsuccessful due to pain and fatigue.    Balance Overall balance assessment: Needs assistance Sitting-balance support: Feet supported;No upper extremity supported Sitting balance-Leahy Scale: Fair                                 ADL Overall ADL's : Needs assistance/impaired Eating/Feeding: Set up;Sitting   Grooming: Set up;Sitting;Supervision/safety Grooming Details (indicate cue type and reason): supported in  chair Upper Body Bathing: Set up;Supervision/ safety;Sitting   Lower Body Bathing: Minimal assistance;Sitting/lateral leans   Upper Body Dressing : Set up;Supervision/safety;Sitting   Lower Body Dressing: Minimal assistance;Sitting/lateral leans                 General ADL Comments: Pt unable to perform sit to stand from chair this session for assessment of functional mobility; pt reports too much knee pain and fatigue from just working with PT.      Vision Vision Assessment?: No apparent visual deficits   Perception     Praxis      Pertinent Vitals/Pain Pain Assessment: Faces Faces Pain Scale: Hurts even more Pain Location: bil knees, L>R Pain Descriptors / Indicators: Aching;Grimacing;Guarding Pain Intervention(s): Monitored during session;Limited activity within patient's tolerance;Patient requesting pain meds-RN notified     Hand Dominance Right   Extremity/Trunk Assessment Upper Extremity Assessment Upper Extremity Assessment: Generalized weakness   Lower Extremity Assessment Lower Extremity Assessment: Defer to PT evaluation   Cervical / Trunk Assessment Cervical / Trunk Assessment: Kyphotic   Communication Communication Communication: No difficulties   Cognition Arousal/Alertness: Awake/alert Behavior During Therapy: WFL for tasks assessed/performed Overall Cognitive Status: Within Functional Limits for tasks assessed                     General Comments       Exercises       Shoulder Instructions      Home Living Family/patient expects to be discharged to:: Private residence Living  Arrangements: Spouse/significant other;Children Available Help at Discharge: Family Type of Home: House Home Access: Stairs to enter Secretary/administrator of Steps: 4 Entrance Stairs-Rails: Right Home Layout: Two level;Bed/bath upstairs Alternate Level Stairs-Number of Steps: 1 flight Alternate Level Stairs-Rails: Right Bathroom Shower/Tub: Retail banker: Standard     Home Equipment: Environmental consultant - 4 wheels;Shower seat;Cane - single point          Prior Functioning/Environment Level of Independence: Needs assistance  Gait / Transfers Assistance Needed: Reports using stick vs rollator for mobility.  ADL's / Homemaking Assistance Needed: Does bird baths but wants to be able to get into shower. Does some cooking, cleaning, gardening.        OT Diagnosis: Generalized weakness;Acute pain   OT Problem List: Decreased strength;Decreased activity tolerance;Impaired balance (sitting and/or standing);Decreased coordination;Decreased knowledge of use of DME or AE;Pain;Increased edema   OT Treatment/Interventions: Self-care/ADL training;Therapeutic exercise;Energy conservation;DME and/or AE instruction;Therapeutic activities;Patient/family education;Balance training    OT Goals(Current goals can be found in the care plan section) Acute Rehab OT Goals Patient Stated Goal: to go home OT Goal Formulation: With patient Time For Goal Achievement: 08/02/16 Potential to Achieve Goals: Good ADL Goals Pt Will Perform Grooming: standing;with modified independence Pt Will Perform Upper Body Bathing: sitting;with modified independence Pt Will Perform Lower Body Bathing: with modified independence;sit to/from stand Pt Will Transfer to Toilet: with modified independence;ambulating;bedside commode Pt/caregiver will Perform Home Exercise Program: Increased strength;Both right and left upper extremity;With theraband;Independently;With written HEP provided  OT Frequency: Min 2X/week   Barriers to D/C: Inaccessible home environment  bed/bath on 2nd floor       Co-evaluation              End of Session Equipment Utilized During Treatment: Gait belt;Rolling walker Nurse Communication: Mobility status;Patient requests pain meds  Activity Tolerance: Patient limited by fatigue;Patient limited by pain Patient left: in chair;with  call bell/phone within reach;with chair alarm set   Time: 1103-1120 OT Time Calculation (min): 17 min Charges:  OT General Charges $OT Visit: 1 Procedure OT Evaluation $OT Eval Moderate Complexity: 1 Procedure G-Codes:     Gaye Alken M.S., OTR/L Pager: [phone]  07/19/2016, 1:25 PM

## 2016-07-20 DIAGNOSIS — E1121 Type 2 diabetes mellitus with diabetic nephropathy: Secondary | ICD-10-CM | POA: Diagnosis not present

## 2016-07-20 DIAGNOSIS — R569 Unspecified convulsions: Secondary | ICD-10-CM | POA: Diagnosis not present

## 2016-07-20 DIAGNOSIS — I110 Hypertensive heart disease with heart failure: Secondary | ICD-10-CM | POA: Diagnosis not present

## 2016-07-20 DIAGNOSIS — R531 Weakness: Secondary | ICD-10-CM | POA: Diagnosis not present

## 2016-07-20 DIAGNOSIS — I5032 Chronic diastolic (congestive) heart failure: Secondary | ICD-10-CM | POA: Diagnosis not present

## 2016-07-24 DIAGNOSIS — R531 Weakness: Secondary | ICD-10-CM | POA: Diagnosis not present

## 2016-07-24 DIAGNOSIS — R569 Unspecified convulsions: Secondary | ICD-10-CM | POA: Diagnosis not present

## 2016-07-24 DIAGNOSIS — I5032 Chronic diastolic (congestive) heart failure: Secondary | ICD-10-CM | POA: Diagnosis not present

## 2016-07-24 DIAGNOSIS — E1121 Type 2 diabetes mellitus with diabetic nephropathy: Secondary | ICD-10-CM | POA: Diagnosis not present

## 2016-07-24 DIAGNOSIS — I110 Hypertensive heart disease with heart failure: Secondary | ICD-10-CM | POA: Diagnosis not present

## 2016-07-26 ENCOUNTER — Telehealth: Payer: Self-pay | Admitting: Emergency Medicine

## 2016-07-26 DIAGNOSIS — I110 Hypertensive heart disease with heart failure: Secondary | ICD-10-CM | POA: Diagnosis not present

## 2016-07-26 DIAGNOSIS — R531 Weakness: Secondary | ICD-10-CM | POA: Diagnosis not present

## 2016-07-26 DIAGNOSIS — E1121 Type 2 diabetes mellitus with diabetic nephropathy: Secondary | ICD-10-CM | POA: Diagnosis not present

## 2016-07-26 DIAGNOSIS — I5032 Chronic diastolic (congestive) heart failure: Secondary | ICD-10-CM | POA: Diagnosis not present

## 2016-07-26 DIAGNOSIS — R569 Unspecified convulsions: Secondary | ICD-10-CM | POA: Diagnosis not present

## 2016-07-26 NOTE — Telephone Encounter (Signed)
error 

## 2016-07-28 NOTE — Telephone Encounter (Signed)
Closing note 

## 2016-07-31 ENCOUNTER — Encounter: Payer: Self-pay | Admitting: Internal Medicine

## 2016-07-31 ENCOUNTER — Other Ambulatory Visit (INDEPENDENT_AMBULATORY_CARE_PROVIDER_SITE_OTHER): Payer: Commercial Managed Care - HMO

## 2016-07-31 ENCOUNTER — Ambulatory Visit (INDEPENDENT_AMBULATORY_CARE_PROVIDER_SITE_OTHER): Payer: Commercial Managed Care - HMO | Admitting: Internal Medicine

## 2016-07-31 DIAGNOSIS — Z9114 Patient's other noncompliance with medication regimen: Secondary | ICD-10-CM

## 2016-07-31 DIAGNOSIS — I5031 Acute diastolic (congestive) heart failure: Secondary | ICD-10-CM

## 2016-07-31 DIAGNOSIS — E538 Deficiency of other specified B group vitamins: Secondary | ICD-10-CM | POA: Diagnosis not present

## 2016-07-31 DIAGNOSIS — N19 Unspecified kidney failure: Secondary | ICD-10-CM

## 2016-07-31 DIAGNOSIS — I130 Hypertensive heart and chronic kidney disease with heart failure and stage 1 through stage 4 chronic kidney disease, or unspecified chronic kidney disease: Secondary | ICD-10-CM

## 2016-07-31 DIAGNOSIS — R531 Weakness: Secondary | ICD-10-CM | POA: Diagnosis not present

## 2016-07-31 DIAGNOSIS — Z23 Encounter for immunization: Secondary | ICD-10-CM | POA: Diagnosis not present

## 2016-07-31 DIAGNOSIS — R569 Unspecified convulsions: Secondary | ICD-10-CM

## 2016-07-31 DIAGNOSIS — E1121 Type 2 diabetes mellitus with diabetic nephropathy: Secondary | ICD-10-CM

## 2016-07-31 DIAGNOSIS — I5032 Chronic diastolic (congestive) heart failure: Secondary | ICD-10-CM | POA: Diagnosis not present

## 2016-07-31 DIAGNOSIS — I129 Hypertensive chronic kidney disease with stage 1 through stage 4 chronic kidney disease, or unspecified chronic kidney disease: Secondary | ICD-10-CM

## 2016-07-31 DIAGNOSIS — I110 Hypertensive heart disease with heart failure: Secondary | ICD-10-CM | POA: Diagnosis not present

## 2016-07-31 DIAGNOSIS — I10 Essential (primary) hypertension: Secondary | ICD-10-CM

## 2016-07-31 LAB — HEMOGLOBIN A1C: Hgb A1c MFr Bld: 6.1 % (ref 4.6–6.5)

## 2016-07-31 LAB — HEPATIC FUNCTION PANEL
ALBUMIN: 3.9 g/dL (ref 3.5–5.2)
ALT: 6 U/L (ref 0–35)
AST: 16 U/L (ref 0–37)
Alkaline Phosphatase: 62 U/L (ref 39–117)
Bilirubin, Direct: 0 mg/dL (ref 0.0–0.3)
TOTAL PROTEIN: 8.1 g/dL (ref 6.0–8.3)
Total Bilirubin: 0.4 mg/dL (ref 0.2–1.2)

## 2016-07-31 LAB — VITAMIN B12: VITAMIN B 12: 615 pg/mL (ref 211–911)

## 2016-07-31 LAB — BASIC METABOLIC PANEL
BUN: 27 mg/dL — ABNORMAL HIGH (ref 6–23)
CALCIUM: 9.4 mg/dL (ref 8.4–10.5)
CO2: 25 meq/L (ref 19–32)
CREATININE: 0.91 mg/dL (ref 0.40–1.20)
Chloride: 105 mEq/L (ref 96–112)
GFR: 76.25 mL/min (ref >60.00)
GLUCOSE: 72 mg/dL (ref 70–99)
Potassium: 4.6 mEq/L (ref 3.5–5.1)
Sodium: 138 mEq/L (ref 135–145)

## 2016-07-31 LAB — SEDIMENTATION RATE: Sed Rate: 108 mm/hr — ABNORMAL HIGH (ref 0–30)

## 2016-07-31 MED ORDER — CLONIDINE HCL 0.1 MG PO TABS: 0.1000 mg | ORAL_TABLET | Freq: Two times a day (BID) | ORAL | 11 refills | Status: DC

## 2016-07-31 NOTE — Assessment & Plan Note (Signed)
On B12 

## 2016-07-31 NOTE — Assessment & Plan Note (Signed)
Coreg Risks associated with treatment noncompliance were discussed. Compliance was encouraged. 

## 2016-07-31 NOTE — Addendum Note (Signed)
Addended by: Merrilyn Puma on: 07/31/2016 12:25 PM   Modules accepted: Orders

## 2016-07-31 NOTE — Progress Notes (Signed)
Pre visit review using our clinic review tool, if applicable. No additional management support is needed unless otherwise documented below in the visit note. 

## 2016-07-31 NOTE — Progress Notes (Signed)
Subjective:  Patient ID: [name], female    DOB: 03-Oct-1933  Age: 82 y.o. MRN: [id number]  CC: No chief complaint on file.   HPI YILIN WEEDON presents for a post-hosp f/u for a seizure on 9/5 - she was d/c'd home on Keppra. MRI w/o new findings... F/u PMR, HTN, TIA. The pt did not take her meds this am  Outpatient Medications Prior to Visit  Medication Sig Dispense Refill  . acetaminophen (TYLENOL) 650 MG CR tablet Take 650 mg by mouth daily.    [name] aspirin EC 325 MG tablet Take 325 mg by mouth daily.    . carvedilol (COREG) 25 MG tablet TAKE 1 TABLET TWICE DAILY WITH A MEAL (Patient taking differently: Take 25 mg by mouth 2 (two) times daily with a meal. ) 180 tablet 3  . cholecalciferol (VITAMIN D) 1000 units tablet Take 1,000 Units by mouth daily.    [name] glucose blood (ONETOUCH VERIO) test strip Use as instructed 50 each 11  . levETIRAcetam (KEPPRA) 500 MG tablet Take 1 tablet (500 mg total) by mouth 2 (two) times daily. 60 tablet 0  . meclizine (ANTIVERT) 25 MG tablet Take 0.5-1 tablets (12.5-25 mg total) by mouth 3 (three) times daily as needed for dizziness. 10 tablet 0  . ONETOUCH DELICA LANCETS FINE MISC 1 Device by Does not apply route daily as needed. 100 each 3  . oxybutynin (DITROPAN) 5 MG tablet Take 1 tablet (5 mg total) by mouth 2 (two) times daily.    . vitamin B-12 (CYANOCOBALAMIN) 1000 MCG tablet Take 1,000 mcg by mouth daily.     No facility-administered medications prior to visit.     ROS Review of Systems  Constitutional: Negative for activity change, appetite change, chills and unexpected weight change.  HENT: Negative for congestion, mouth sores and sinus pressure.   Eyes: Negative for visual disturbance.  Respiratory: Negative for cough and chest tightness.   Gastrointestinal: Negative for abdominal pain and nausea.  Genitourinary: Negative for difficulty urinating, frequency and vaginal pain.  Musculoskeletal: Negative for back pain and gait  problem.  Skin: Negative for pallor and rash.  Neurological: Positive for dizziness and weakness. Negative for tremors, numbness and headaches.  Psychiatric/Behavioral: Negative for confusion and sleep disturbance. The patient is nervous/anxious.     Objective:  BP (!) 200/90   Pulse (!) 56   Temp 98.4 F (36.9 C) (Oral)   Wt 147 lb (66.7 kg)   SpO2 99%   BMI 24.46 kg/m   BP Readings from Last 3 Encounters:  07/31/16 (!) 200/90  07/19/16 (!) 134/56  06/14/16 (!) 223/71    Wt Readings from Last 3 Encounters:  07/31/16 147 lb (66.7 kg)  07/18/16 151 lb 12.8 oz (68.9 kg)  06/14/16 142 lb (64.4 kg)    Physical Exam  Constitutional: She appears well-developed. No distress.  HENT:  Head: Normocephalic.  Right Ear: External ear normal.  Left Ear: External ear normal.  Nose: Nose normal.  Mouth/Throat: Oropharynx is clear and moist.  Eyes: Conjunctivae are normal. Pupils are equal, round, and reactive to light. Right eye exhibits no discharge. Left eye exhibits no discharge.  Neck: Normal range of motion. Neck supple. No JVD present. No tracheal deviation present. No thyromegaly present.  Cardiovascular: Normal rate, regular rhythm and normal heart sounds.   Pulmonary/Chest: No stridor. No respiratory distress. She has no wheezes.  Abdominal: Soft. Bowel sounds are normal. She exhibits no distension and no mass. There is no  tenderness. There is no rebound and no guarding.  Musculoskeletal: She exhibits tenderness. She exhibits no edema.  Lymphadenopathy:    She has no cervical adenopathy.  Neurological: She displays normal reflexes. No cranial nerve deficit. She exhibits normal muscle tone. Coordination abnormal.  Skin: No rash noted. No erythema.  Psychiatric: She has a normal mood and affect. Her behavior is normal. Judgment and thought content normal.  using a walker  Lab Results  Component Value Date   WBC 5.4 07/19/2016   HGB 9.5 (L) 07/19/2016   HCT 31.6 (L)  07/19/2016   PLT 255 07/19/2016   GLUCOSE 85 07/19/2016   CHOL 169 06/25/2015   TRIG 61 06/25/2015   HDL 57 06/25/2015   LDLDIRECT 114.8 01/10/2011   LDLCALC 100 (H) 06/25/2015   ALT 9 (L) 07/19/2016   AST 20 07/19/2016   NA 141 07/19/2016   K 3.8 07/19/2016   CL 107 07/19/2016   CREATININE 0.85 07/19/2016   BUN 20 07/19/2016   CO2 26 07/19/2016   TSH 1.59 08/12/2015   INR 1.09 07/17/2016   HGBA1C 6.2 02/28/2016    Mr Brain W Wo Contrast  Result Date: 07/17/2016 CLINICAL DATA:  Seizure EXAM: MRI HEAD WITHOUT AND WITH CONTRAST TECHNIQUE: Multiplanar, multiecho pulse sequences of the brain and surrounding structures were obtained without and with intravenous contrast. CONTRAST:  83mL MULTIHANCE GADOBENATE DIMEGLUMINE 529 MG/ML IV SOLN COMPARISON:  CT head 07/17/2016.  MRI 01/13/2016 FINDINGS: Mild atrophy.  Negative for hydrocephalus. Negative for acute infarct. Small chronic infarct right cerebellum. Benign cyst left basal ganglia unchanged. For negative for intracranial hemorrhage or fluid collection. Negative for mass or edema.  No shift of the midline structures. Postcontrast imaging reveals enhancing vessel in the left basal ganglia which is prominent but probably not pathologic. No enhancing mass lesion. Leptomeningeal enhancement normal. Paranasal sinuses clear.  Normal orbit.  Empty sella unchanged. IMPRESSION: No acute abnormality and no change from prior studies. Electronically Signed   By: Marlan Palau M.D.   On: 07/17/2016 12:48   Dg Chest Port 1 View  Result Date: 07/17/2016 CLINICAL DATA:  Acute onset of seizure. Unresponsive. Initial encounter. EXAM: PORTABLE CHEST 1 VIEW COMPARISON:  Chest radiograph performed 04/12/2015 FINDINGS: The lungs are well-aerated. Vascular congestion is noted. Bibasilar airspace opacities may reflect atelectasis or possibly mild pneumonia. There is no evidence of pleural effusion or pneumothorax. The cardiomediastinal silhouette is borderline  enlarged. No acute osseous abnormalities are seen. IMPRESSION: Vascular congestion and borderline cardiomegaly. Bibasilar airspace opacities may reflect atelectasis or possibly mild pneumonia. Electronically Signed   By: Roanna Raider M.D.   On: 07/17/2016 01:29   Ct Head Code Stroke W/o Cm  Result Date: 07/17/2016 CLINICAL DATA:  Code stroke. Initial evaluation for acute altered mental status. EXAM: CT HEAD WITHOUT CONTRAST TECHNIQUE: Contiguous axial images were obtained from the base of the skull through the vertex without intravenous contrast. COMPARISON:  Recent CT and MRI from 06/14/2016. FINDINGS: Scalp soft tissues demonstrate no acute abnormality. Globes and orbits demonstrate no acute abnormality. Patient is status post lens extraction on the left. Mild scattered opacity within the ethmoidal air cells. Paranasal sinuses are otherwise clear. No mastoid effusion. Calvarium intact. Stable atrophy with chronic microvascular ischemic disease. Remote lacunar infarct versus dilated perivascular space within the left lentiform nucleus. No acute intracranial hemorrhage. No acute large vessel territory infarct. Gray-white matter differentiation maintained. Deep gray nuclei maintained. No hyperdense vessel. No mass lesion, midline shift, or mass effect. No hydrocephalus. No  extra-axial fluid collection. Empty sella again noted. Radiology stomach without an obscured hand discal about the patient patellar I do not see a see a neuro ASPECTS (Alberta Stroke Program Early CT Score) - Ganglionic level infarction (caudate, lentiform nuclei, internal capsule, insula, M1-M3 cortex): 7 - Supraganglionic infarction (M4-M6 cortex): 3 Total score (0-10 with 10 being normal): 10 IMPRESSION: 1. No acute intracranial infarct or other process identified. 2. ASPECTS is 10 3. Stable atrophy with chronic microvascular ischemic disease. Critical Value/emergent results were called by telephone at the time of interpretation on 07/17/2016  at 1:20 am to Dr. Roseanne Reno , who verbally acknowledged these results. Electronically Signed   By: Rise Mu M.D.   On: 07/17/2016 01:22    Assessment & Plan:   There are no diagnoses linked to this encounter. I am having Ms. Parker maintain her vitamin B-12, glucose blood, ONETOUCH DELICA LANCETS FINE, carvedilol, aspirin EC, cholecalciferol, acetaminophen, meclizine, levETIRAcetam, and oxybutynin.  No orders of the defined types were placed in this encounter.    Follow-up: No Follow-up on file.  [name], MD

## 2016-07-31 NOTE — Assessment & Plan Note (Signed)
On Coreg Risks associated with treatment noncompliance were discussed. Compliance was encouraged. 9/17 Worse: added Catapress po 

## 2016-07-31 NOTE — Assessment & Plan Note (Signed)
Stopped Metformin 9/17

## 2016-07-31 NOTE — Assessment & Plan Note (Signed)
new - on Keppra Neurol ref pending

## 2016-07-31 NOTE — Assessment & Plan Note (Signed)
On Coreg Risks associated with treatment noncompliance were discussed. Compliance was encouraged. 9/17 Worse: added Catapress po

## 2016-08-01 DIAGNOSIS — E1121 Type 2 diabetes mellitus with diabetic nephropathy: Secondary | ICD-10-CM | POA: Diagnosis not present

## 2016-08-01 DIAGNOSIS — R531 Weakness: Secondary | ICD-10-CM | POA: Diagnosis not present

## 2016-08-01 DIAGNOSIS — R569 Unspecified convulsions: Secondary | ICD-10-CM | POA: Diagnosis not present

## 2016-08-01 DIAGNOSIS — I110 Hypertensive heart disease with heart failure: Secondary | ICD-10-CM | POA: Diagnosis not present

## 2016-08-01 DIAGNOSIS — I5032 Chronic diastolic (congestive) heart failure: Secondary | ICD-10-CM | POA: Diagnosis not present

## 2016-08-02 ENCOUNTER — Other Ambulatory Visit: Payer: Self-pay | Admitting: *Deleted

## 2016-08-02 ENCOUNTER — Other Ambulatory Visit: Payer: Self-pay | Admitting: Internal Medicine

## 2016-08-02 DIAGNOSIS — R569 Unspecified convulsions: Secondary | ICD-10-CM | POA: Diagnosis not present

## 2016-08-02 DIAGNOSIS — I5032 Chronic diastolic (congestive) heart failure: Secondary | ICD-10-CM | POA: Diagnosis not present

## 2016-08-02 DIAGNOSIS — R531 Weakness: Secondary | ICD-10-CM | POA: Diagnosis not present

## 2016-08-02 DIAGNOSIS — I110 Hypertensive heart disease with heart failure: Secondary | ICD-10-CM | POA: Diagnosis not present

## 2016-08-02 DIAGNOSIS — E1121 Type 2 diabetes mellitus with diabetic nephropathy: Secondary | ICD-10-CM | POA: Diagnosis not present

## 2016-08-02 NOTE — Telephone Encounter (Signed)
Pt is requesting Rf on Meclizine that was prescribed at ER. Please advise

## 2016-08-06 ENCOUNTER — Telehealth: Payer: Self-pay

## 2016-08-06 MED ORDER — MECLIZINE HCL 25 MG PO TABS: 12.5000 mg | ORAL_TABLET | Freq: Three times a day (TID) | ORAL | 3 refills | Status: DC | PRN

## 2016-08-06 NOTE — Telephone Encounter (Signed)
Home Health Cert/Plan of Care received (07/20/2016 - 09/17/2016)  and placed on MD's desk for signature

## 2016-08-07 DIAGNOSIS — R531 Weakness: Secondary | ICD-10-CM | POA: Diagnosis not present

## 2016-08-07 DIAGNOSIS — E1121 Type 2 diabetes mellitus with diabetic nephropathy: Secondary | ICD-10-CM | POA: Diagnosis not present

## 2016-08-07 DIAGNOSIS — I5032 Chronic diastolic (congestive) heart failure: Secondary | ICD-10-CM | POA: Diagnosis not present

## 2016-08-07 DIAGNOSIS — I110 Hypertensive heart disease with heart failure: Secondary | ICD-10-CM | POA: Diagnosis not present

## 2016-08-07 DIAGNOSIS — R569 Unspecified convulsions: Secondary | ICD-10-CM | POA: Diagnosis not present

## 2016-08-08 ENCOUNTER — Telehealth: Payer: Self-pay | Admitting: Emergency Medicine

## 2016-08-08 NOTE — Telephone Encounter (Signed)
Pt called and stated she feels like she is going to explode and wants to know if she can get something for depression. She asked that you give her a call ASAP. Thanks.

## 2016-08-09 ENCOUNTER — Other Ambulatory Visit: Payer: Self-pay | Admitting: *Deleted

## 2016-08-09 DIAGNOSIS — R569 Unspecified convulsions: Secondary | ICD-10-CM | POA: Diagnosis not present

## 2016-08-09 DIAGNOSIS — R531 Weakness: Secondary | ICD-10-CM | POA: Diagnosis not present

## 2016-08-09 DIAGNOSIS — E1121 Type 2 diabetes mellitus with diabetic nephropathy: Secondary | ICD-10-CM | POA: Diagnosis not present

## 2016-08-09 DIAGNOSIS — I5032 Chronic diastolic (congestive) heart failure: Secondary | ICD-10-CM | POA: Diagnosis not present

## 2016-08-09 DIAGNOSIS — I110 Hypertensive heart disease with heart failure: Secondary | ICD-10-CM | POA: Diagnosis not present

## 2016-08-09 MED ORDER — PREDNISONE 5 MG PO TABS: 5.0000 mg | ORAL_TABLET | Freq: Every day | ORAL | 1 refills | Status: DC
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
## 2016-08-09 NOTE — Telephone Encounter (Signed)
Paperwork signed, faxed, copy sent to scan 

## 2016-08-10 MED ORDER — ESCITALOPRAM OXALATE 5 MG PO TABS: 5.0000 mg | ORAL_TABLET | Freq: Every day | ORAL | 5 refills | Status: DC

## 2016-08-10 NOTE — Telephone Encounter (Signed)
Pt informed

## 2016-08-10 NOTE — Telephone Encounter (Signed)
Ok Lexapro - Rx emailed Thx

## 2016-08-10 NOTE — Addendum Note (Signed)
Addended by: Tresa Garter on: 08/10/2016 10:10 AM   Modules accepted: Orders

## 2016-08-17 ENCOUNTER — Other Ambulatory Visit: Payer: Self-pay

## 2016-08-17 NOTE — Patient Outreach (Signed)
[facility]) Care Management  08/17/2016  [name] 1933-01-12 [id number]   Telephonic Screening   Referral Date:  08/14/16 Source: Shelby Comment, RN  [phone] Issue:  New seizures felt to be due to hypoglycemia; h/o TIA/Stroke.  Started on Keppra.  Follow up neuro appt 09/17/16.  Reports feeling very low energy and depression from issues with husband.  Has not been checking blood sugars even with new glucometer from PCP.  Lives with husband who is blind and cannot help with care and daughter who works 9 hours a day.  May benefit from a face to face to assess safety (especially with concerns of husband) and to ensure compliance with appropriate self care.  Already has home PT following.   Requesting SW services.  Insurance:  [employer] Medicare HMO   Subjective: Outreach call #1 to patient.  States she is not feeling well. Patient confirms the above details.  Patient states she does not feel like reviewing all her medications this call or completing all the screening questions.  Patient reached and agreed to [name].   PCP: Dr. Jacinta Shoe, [facility] - last appt 07/31/2016 Recent Admission:  07/17/16 - 07/19/16 ED visits:  2   BP 200/90 07/31/2016 Weight 147 lb (67 kg) 07/31/2016 Height 62 in (157 cm) 07/17/2016 BMI 24.46 (Normal) 07/31/2016  Prevnar (PCV13) Va N/D Pneumovax (PPS 01/17/2010 Flu Vaccine 07/31/2016 tDAP Vaccine N/D  Objective:   Encounter Medications:  Outpatient Encounter Prescriptions as of 08/17/2016  Medication Sig  . acetaminophen (TYLENOL) 650 MG CR tablet Take 650 mg by mouth daily.  [name] aspirin EC 325 MG tablet Take 325 mg by mouth daily.  . carvedilol (COREG) 25 MG tablet TAKE 1 TABLET TWICE DAILY WITH A MEAL (Patient taking differently: Take 25 mg by mouth 2 (two) times daily with a meal. )  . cholecalciferol (VITAMIN D) 1000 units tablet Take 1,000 Units by mouth daily.  . cloNIDine (CATAPRES) 0.1 MG  tablet Take 1 tablet (0.1 mg total) by mouth 2 (two) times daily.  [name] escitalopram (LEXAPRO) 5 MG tablet Take 1 tablet (5 mg total) by mouth daily.  [name] glucose blood (ONETOUCH VERIO) test strip Use as instructed  . levETIRAcetam (KEPPRA) 500 MG tablet Take 1 tablet (500 mg total) by mouth 2 (two) times daily.  . meclizine (ANTIVERT) 25 MG tablet Take 0.5-1 tablets (12.5-25 mg total) by mouth 3 (three) times daily as needed for dizziness.  [name] DELICA LANCETS FINE MISC 1 Device by Does not apply route daily as needed.  [name] oxybutynin (DITROPAN) 5 MG tablet Take 1 tablet (5 mg total) by mouth 2 (two) times daily.  . predniSONE (DELTASONE) 5 MG tablet Take 1 tablet (5 mg total) by mouth daily with breakfast.  . vitamin B-12 (CYANOCOBALAMIN) 1000 MCG tablet Take 1,000 mcg by mouth daily.   No facility-administered encounter medications on file as of 08/17/2016.     Functional Status:  No flowsheet data found.  Fall/Depression Screening: PHQ 2/9 Scores 08/17/2016 07/31/2016 06/22/2015  PHQ - 2 Score 2 6 2   PHQ- 9 Score 5 18 -    Fall Risk  08/17/2016 07/31/2016 06/22/2015  Falls in the past year? Yes Yes No  Number falls in past yr: 2 or more 2 or more -  Injury with Fall? Yes Yes -  Risk Factor Category  High Fall Risk - -  Risk for fall due to : History of fall(s);Impaired balance/gait;Medication side effect;Other (Jefferson) - -  Follow up Falls evaluation completed;Education provided;Falls prevention discussed - -    Plan:  [facility] RN CM Referral  -Recent admission over the past 30 days.  -Uncontrolled HTN -new seizures felt to be due to hypoglycemia -Home safety evaluation  [facility] SW Referral  -Community Resources to provide services in the home or other options.  Patient caring for her blind husband in the home and dealing with her own health issues. Daughter works.     [name], BSN, RN, CCM  [facility] [employer] [facility] Care Management  Coordinator [phone] Direct [phone] Cell [phone] Office [phone] Fax [name]

## 2016-08-21 ENCOUNTER — Encounter: Payer: Self-pay | Admitting: *Deleted

## 2016-08-23 ENCOUNTER — Other Ambulatory Visit: Payer: Self-pay | Admitting: *Deleted

## 2016-08-23 ENCOUNTER — Ambulatory Visit: Payer: Self-pay

## 2016-08-24 NOTE — Patient Outreach (Signed)
[facility]) Care Management  08/24/2016  [name] 06-30-1933 [id number]  CSW received a new referral on patient from Donato Schultz, Telephonic Nurse Case Manager with [facility] Care Management, indicating that patient would benefit from social work services and resources.  More specifically, Mrs. Hutchinson stated that patient is requesting community resources to provide services in the home.  Patient admits to caring for her husband, who is legally blind, as well as dealing with her own medical issues. CSW made an initial attempt to try and contact patient today to perform phone assessment, as well as assess and assist with social needs and services, without success.  A HIPAA complaint message was left for patient on voicemail.  CSW is currently awaiting a return call.  CSW will make a second outreach attempt in one week, if CSW does not receive a return call from patient in the meantime. Danford Bad, BSW, MSW, LCSW  Licensed Restaurant manager, fast food [facility]  Mailing [place] [address] [place], [place] [address] Physical Address-[address], [place], [place] [address] Toll Free Main # [phone] Fax # [phone] Cell # [phone]  Fax # [phone]  [email or web address]

## 2016-08-29 NOTE — Patient Outreach (Signed)
[facility]) Care Management  08/29/2016  [name] 07-Nov-1933 [id number]   Request received from Danford Bad, LCSW to mail patient home health agency information as well as private agency information as well as resources for individuals with vision impairment. Information mailed, 08/29/16  [name], Conrad Forsyth  [facility] Care Management Assistant

## 2016-08-31 ENCOUNTER — Other Ambulatory Visit: Payer: Self-pay | Admitting: *Deleted

## 2016-08-31 ENCOUNTER — Other Ambulatory Visit: Payer: Self-pay

## 2016-08-31 NOTE — Patient Outreach (Signed)
[facility]) Care Management  08/31/16  [name] Mar 12, 1933 [id number]  Attempted to reach patient without success. A gentleman answered the phone and identified himself as the patient's husband. She reported that the patient was out running errands with their daughter. RNCM left HIPAA compliant message with her husband, but he was unable to take RNCM's contact information. He stated he would let her know that I had called and will call back next week.   [name], RN, BSN, CCM [facility] Care Management Coordinator [phone]

## 2016-09-02 NOTE — Patient Outreach (Signed)
[facility]) Care Management  09/02/2016  [name] 1933/09/19 [id number]   CSW made a second attempt to try and contact patient today to perform phone assessment, as well as assess and assist with social work needs and services, without success.  A HIPAA compliant message was left for patient on voicemail.  CSW will make a third outreach attempt within the next week, to try and establish initial contact with patient. Danford Bad, BSW, MSW, LCSW  Licensed Restaurant manager, fast food [facility]  Mailing [place] [address] [place], [place] [address] Physical Address-[address], [place], [place] [address] Toll Free Main # [phone] Fax # [phone] Cell # [phone]  Fax # [phone]  [name]

## 2016-09-04 DIAGNOSIS — I110 Hypertensive heart disease with heart failure: Secondary | ICD-10-CM | POA: Diagnosis not present

## 2016-09-04 DIAGNOSIS — R1310 Dysphagia, unspecified: Secondary | ICD-10-CM

## 2016-09-04 DIAGNOSIS — Z7984 Long term (current) use of oral hypoglycemic drugs: Secondary | ICD-10-CM

## 2016-09-04 DIAGNOSIS — E1121 Type 2 diabetes mellitus with diabetic nephropathy: Secondary | ICD-10-CM | POA: Diagnosis not present

## 2016-09-04 DIAGNOSIS — R531 Weakness: Secondary | ICD-10-CM | POA: Diagnosis not present

## 2016-09-04 DIAGNOSIS — I5032 Chronic diastolic (congestive) heart failure: Secondary | ICD-10-CM

## 2016-09-04 DIAGNOSIS — R569 Unspecified convulsions: Secondary | ICD-10-CM | POA: Diagnosis not present

## 2016-09-04 DIAGNOSIS — Z8673 Personal history of transient ischemic attack (TIA), and cerebral infarction without residual deficits: Secondary | ICD-10-CM

## 2016-09-04 DIAGNOSIS — Z7982 Long term (current) use of aspirin: Secondary | ICD-10-CM

## 2016-09-07 ENCOUNTER — Other Ambulatory Visit: Payer: Self-pay

## 2016-09-07 NOTE — Patient Outreach (Signed)
[facility]) Care Management  09/07/16  [name] 02-Jul-1933 [id number]  Attempted to reach patient without success. Phone rang multiple times with no answer and no option to leave a voicemail.  Turkey R. Sherif Millspaugh, RN, BSN, CCM [facility] Care Management Coordinator [phone]

## 2016-09-11 ENCOUNTER — Other Ambulatory Visit: Payer: Self-pay | Admitting: *Deleted

## 2016-09-11 ENCOUNTER — Encounter: Payer: Self-pay | Admitting: *Deleted

## 2016-09-11 NOTE — Patient Outreach (Signed)
[facility]) Care Management  09/11/2016  [name] 26-Jun-1933 [id number]   CSW made a third and final attempt to try and contact patient today to perform phone assessment, as well as assess and assist with social work needs and services, without success.  A HIPAA compliant message was left for patient on voicemail.  CSW  continues to await a return call.  CSW will mail an outreach letter to patient's home, encouraging patient to contact CSW at their earliest convenience, if patient is interested in receiving social work services through CSW with [facility], [facility].  If CSW does not receive a return call from patient within the next 10 business days, CSW will proceed with case closure.  Required number of phone attempts will have been made and outreach letter mailed.   Danford Bad, BSW, MSW, LCSW  Licensed Restaurant manager, fast food [facility]  Mailing [place] [address], [place], [place] [address] Physical Address-[address], [place], [place] [address] Toll Free Main # [phone] Fax # [phone] Cell # [phone]  Fax # [phone]  [email or web address]

## 2016-09-17 ENCOUNTER — Ambulatory Visit: Payer: Commercial Managed Care - HMO | Admitting: Neurology

## 2016-09-19 ENCOUNTER — Telehealth: Payer: Self-pay | Admitting: Internal Medicine

## 2016-09-19 NOTE — Telephone Encounter (Signed)
I called pt- she was given Keppra 50 mg in hospital for seizures. Does she need to continue this? If so, she needs a refill.   She is scheduled to see you on 10/01/16. She wants to know if you can see her sooner. You have openings this Friday.   Please advise.

## 2016-09-19 NOTE — Telephone Encounter (Signed)
Pt called in and has some question

## 2016-09-19 NOTE — Telephone Encounter (Signed)
PCP informed of below. He states pt should continue Keppra and she should be seeing neurologist for this. He will see pt this Friday 09/21/16.  OV scheduled for 1:15. Pt informed

## 2016-09-21 ENCOUNTER — Ambulatory Visit (INDEPENDENT_AMBULATORY_CARE_PROVIDER_SITE_OTHER): Payer: Commercial Managed Care - HMO | Admitting: Internal Medicine

## 2016-09-21 ENCOUNTER — Encounter: Payer: Self-pay | Admitting: Internal Medicine

## 2016-09-21 VITALS — BP 150/80 | HR 60 | Resp 12 | Wt 151.0 lb

## 2016-09-21 DIAGNOSIS — R569 Unspecified convulsions: Secondary | ICD-10-CM | POA: Diagnosis not present

## 2016-09-21 DIAGNOSIS — I10 Essential (primary) hypertension: Secondary | ICD-10-CM

## 2016-09-21 DIAGNOSIS — I5031 Acute diastolic (congestive) heart failure: Secondary | ICD-10-CM | POA: Diagnosis not present

## 2016-09-21 DIAGNOSIS — E538 Deficiency of other specified B group vitamins: Secondary | ICD-10-CM

## 2016-09-21 DIAGNOSIS — E1121 Type 2 diabetes mellitus with diabetic nephropathy: Secondary | ICD-10-CM

## 2016-09-21 DIAGNOSIS — R350 Frequency of micturition: Secondary | ICD-10-CM

## 2016-09-21 DIAGNOSIS — R42 Dizziness and giddiness: Secondary | ICD-10-CM

## 2016-09-21 MED ORDER — CLONIDINE HCL 0.1 MG PO TABS
0.1000 mg | ORAL_TABLET | Freq: Two times a day (BID) | ORAL | 11 refills | Status: DC
Start: 2016-09-21 — End: 2016-09-21

## 2016-09-21 MED ORDER — CARVEDILOL 25 MG PO TABS
25.0000 mg | ORAL_TABLET | Freq: Two times a day (BID) | ORAL | 3 refills | Status: DC
Start: 2016-09-21 — End: 2017-08-27

## 2016-09-21 MED ORDER — OXYBUTYNIN CHLORIDE 5 MG PO TABS: 5.0000 mg | ORAL_TABLET | Freq: Two times a day (BID) | ORAL | 3 refills | Status: DC

## 2016-09-21 MED ORDER — CLONIDINE HCL 0.1 MG PO TABS: 0.1000 mg | ORAL_TABLET | Freq: Two times a day (BID) | ORAL | 11 refills | Status: DC

## 2016-09-21 MED ORDER — ONETOUCH DELICA LANCETS FINE MISC: 1.0000 | Freq: Every day | 3 refills | Status: DC | PRN

## 2016-09-21 MED ORDER — PREDNISONE 5 MG PO TABS: 5.0000 mg | ORAL_TABLET | Freq: Every day | ORAL | 1 refills | Status: DC

## 2016-09-21 MED ORDER — GLUCOSE BLOOD VI STRP: ORAL_STRIP | 11 refills | Status: DC

## 2016-09-21 MED ORDER — LEVETIRACETAM 500 MG PO TABS: 500.0000 mg | ORAL_TABLET | Freq: Two times a day (BID) | ORAL | 3 refills | Status: DC

## 2016-09-21 NOTE — Assessment & Plan Note (Signed)
Coreg Risks associated with treatment noncompliance were discussed. Compliance was encouraged.

## 2016-09-21 NOTE — Assessment & Plan Note (Signed)
Labs

## 2016-09-21 NOTE — Assessment & Plan Note (Signed)
Meclizine prn 

## 2016-09-21 NOTE — Progress Notes (Signed)
Pre visit review using our clinic review tool, if applicable. No additional management support is needed unless otherwise documented below in the visit note. 

## 2016-09-21 NOTE — Assessment & Plan Note (Signed)
Keppra -- re-start Risks associated with treatment noncompliance were discussed. Compliance was encouraged. Neurol ref

## 2016-09-21 NOTE — Assessment & Plan Note (Signed)
UA Oxybutinine

## 2016-09-21 NOTE — Assessment & Plan Note (Signed)
On Coreg 

## 2016-09-21 NOTE — Assessment & Plan Note (Signed)
On B12 

## 2016-09-21 NOTE — Progress Notes (Signed)
Subjective:  Patient ID: [name], female    DOB: [birth year]  Age: 82 y.o. MRN: [id number]  CC: No chief complaint on file.   HPI [name] presents for HTN, PMR, depression f/u. F/u seizure  - on Keppra: not taking - ran out  Outpatient Medications Prior to Visit  Medication Sig Dispense Refill  . acetaminophen (TYLENOL) 650 MG CR tablet Take 650 mg by mouth daily.    [name] aspirin EC 325 MG tablet Take 325 mg by mouth daily.    . carvedilol (COREG) 25 MG tablet TAKE 1 TABLET TWICE DAILY WITH A MEAL (Patient taking differently: Take 25 mg by mouth 2 (two) times daily with a meal. ) 180 tablet 3  . cholecalciferol (VITAMIN D) 1000 units tablet Take 1,000 Units by mouth daily.    . cloNIDine (CATAPRES) 0.1 MG tablet Take 1 tablet (0.1 mg total) by mouth 2 (two) times daily. 60 tablet 11  . escitalopram (LEXAPRO) 5 MG tablet Take 1 tablet (5 mg total) by mouth daily. 30 tablet 5  . glucose blood (ONETOUCH VERIO) test strip Use as instructed 50 each 11  . levETIRAcetam (KEPPRA) 500 MG tablet Take 1 tablet (500 mg total) by mouth 2 (two) times daily. 60 tablet 0  . meclizine (ANTIVERT) 25 MG tablet Take 0.5-1 tablets (12.5-25 mg total) by mouth 3 (three) times daily as needed for dizziness. 60 tablet 3  . ONETOUCH DELICA LANCETS FINE MISC 1 Device by Does not apply route daily as needed. 100 each 3  . oxybutynin (DITROPAN) 5 MG tablet Take 1 tablet (5 mg total) by mouth 2 (two) times daily.    . predniSONE (DELTASONE) 5 MG tablet Take 1 tablet (5 mg total) by mouth daily with breakfast. 90 tablet 1  . vitamin B-12 (CYANOCOBALAMIN) 1000 MCG tablet Take 1,000 mcg by mouth daily.     No facility-administered medications prior to visit.     ROS Review of Systems  Constitutional: Positive for fatigue and unexpected weight change. Negative for activity change, appetite change and chills.  HENT: Negative for congestion, mouth sores and sinus pressure.   Eyes: Negative for visual  disturbance.  Respiratory: Negative for cough and chest tightness.   Gastrointestinal: Negative for abdominal pain and nausea.  Genitourinary: Negative for difficulty urinating, frequency and vaginal pain.  Musculoskeletal: Positive for arthralgias, back pain and neck stiffness. Negative for gait problem.  Skin: Negative for pallor and rash.  Neurological: Negative for dizziness, tremors, weakness, numbness and headaches.  Psychiatric/Behavioral: Positive for sleep disturbance. Negative for confusion.    Objective:  BP (!) 150/80   Pulse 60   Resp 12   Wt 151 lb (68.5 kg)   BMI 25.13 kg/m   BP Readings from Last 3 Encounters:  09/21/16 (!) 150/80  07/31/16 (!) 200/90  07/19/16 (!) 134/56    Wt Readings from Last 3 Encounters:  09/21/16 151 lb (68.5 kg)  07/31/16 147 lb (66.7 kg)  07/18/16 151 lb 12.8 oz (68.9 kg)    Physical Exam  Constitutional: She appears well-developed. No distress.  HENT:  Head: Normocephalic.  Right Ear: External ear normal.  Left Ear: External ear normal.  Nose: Nose normal.  Mouth/Throat: Oropharynx is clear and moist.  Eyes: Conjunctivae are normal. Pupils are equal, round, and reactive to light. Right eye exhibits no discharge. Left eye exhibits no discharge.  Neck: Normal range of motion. Neck supple. No JVD present. No tracheal deviation present. No thyromegaly present.  Cardiovascular: Normal rate, regular rhythm and normal heart sounds.   Pulmonary/Chest: No stridor. No respiratory distress. She has no wheezes.  Abdominal: Soft. Bowel sounds are normal. She exhibits no distension and no mass. There is no tenderness. There is no rebound and no guarding.  Musculoskeletal: She exhibits tenderness. She exhibits no edema.  Lymphadenopathy:    She has no cervical adenopathy.  Neurological: She displays normal reflexes. No cranial nerve deficit. She exhibits normal muscle tone. Coordination abnormal.  Skin: No rash noted. No erythema.    Psychiatric: She has a normal mood and affect. Her behavior is normal. Judgment and thought content normal.   Labs Achy w/ROM  Lab Results  Component Value Date   WBC 5.4 07/19/2016   HGB 9.5 (L) 07/19/2016   HCT 31.6 (L) 07/19/2016   PLT 255 07/19/2016   GLUCOSE 72 07/31/2016   CHOL 169 06/25/2015   TRIG 61 06/25/2015   HDL 57 06/25/2015   LDLDIRECT 114.8 01/10/2011   LDLCALC 100 (H) 06/25/2015   ALT 6 07/31/2016   AST 16 07/31/2016   NA 138 07/31/2016   K 4.6 07/31/2016   CL 105 07/31/2016   CREATININE 0.91 07/31/2016   BUN 27 (H) 07/31/2016   CO2 25 07/31/2016   TSH 1.59 08/12/2015   INR 1.09 07/17/2016   HGBA1C 6.1 07/31/2016    Mr Brain W Wo Contrast  Result Date: 07/17/2016 CLINICAL DATA:  Seizure EXAM: MRI HEAD WITHOUT AND WITH CONTRAST TECHNIQUE: Multiplanar, multiecho pulse sequences of the brain and surrounding structures were obtained without and with intravenous contrast. CONTRAST:  29mL MULTIHANCE GADOBENATE DIMEGLUMINE 529 MG/ML IV SOLN COMPARISON:  CT head 07/17/2016.  MRI 01/13/2016 FINDINGS: Mild atrophy.  Negative for hydrocephalus. Negative for acute infarct. Small chronic infarct right cerebellum. Benign cyst left basal ganglia unchanged. For negative for intracranial hemorrhage or fluid collection. Negative for mass or edema.  No shift of the midline structures. Postcontrast imaging reveals enhancing vessel in the left basal ganglia which is prominent but probably not pathologic. No enhancing mass lesion. Leptomeningeal enhancement normal. Paranasal sinuses clear.  Normal orbit.  Empty sella unchanged. IMPRESSION: No acute abnormality and no change from prior studies. Electronically Signed   By: Marlan Palau M.D.   On: 07/17/2016 12:48   Dg Chest Port 1 View  Result Date: 07/17/2016 CLINICAL DATA:  Acute onset of seizure. Unresponsive. Initial encounter. EXAM: PORTABLE CHEST 1 VIEW COMPARISON:  Chest radiograph performed 04/12/2015 FINDINGS: The lungs are  well-aerated. Vascular congestion is noted. Bibasilar airspace opacities may reflect atelectasis or possibly mild pneumonia. There is no evidence of pleural effusion or pneumothorax. The cardiomediastinal silhouette is borderline enlarged. No acute osseous abnormalities are seen. IMPRESSION: Vascular congestion and borderline cardiomegaly. Bibasilar airspace opacities may reflect atelectasis or possibly mild pneumonia. Electronically Signed   By: Roanna Raider M.D.   On: 07/17/2016 01:29   Ct Head Code Stroke W/o Cm  Result Date: 07/17/2016 CLINICAL DATA:  Code stroke. Initial evaluation for acute altered mental status. EXAM: CT HEAD WITHOUT CONTRAST TECHNIQUE: Contiguous axial images were obtained from the base of the skull through the vertex without intravenous contrast. COMPARISON:  Recent CT and MRI from 06/14/2016. FINDINGS: Scalp soft tissues demonstrate no acute abnormality. Globes and orbits demonstrate no acute abnormality. Patient is status post lens extraction on the left. Mild scattered opacity within the ethmoidal air cells. Paranasal sinuses are otherwise clear. No mastoid effusion. Calvarium intact. Stable atrophy with chronic microvascular ischemic disease. Remote lacunar infarct versus  dilated perivascular space within the left lentiform nucleus. No acute intracranial hemorrhage. No acute large vessel territory infarct. Gray-white matter differentiation maintained. Deep gray nuclei maintained. No hyperdense vessel. No mass lesion, midline shift, or mass effect. No hydrocephalus. No extra-axial fluid collection. Empty sella again noted. Radiology stomach without an obscured hand discal about the patient patellar I do not see a see a neuro ASPECTS (Alberta Stroke Program Early CT Score) - Ganglionic level infarction (caudate, lentiform nuclei, internal capsule, insula, M1-M3 cortex): 7 - Supraganglionic infarction (M4-M6 cortex): 3 Total score (0-10 with 10 being normal): 10 IMPRESSION: 1. No  acute intracranial infarct or other process identified. 2. ASPECTS is 10 3. Stable atrophy with chronic microvascular ischemic disease. Critical Value/emergent results were called by telephone at the time of interpretation on 07/17/2016 at 1:20 am to Dr. Roseanne Reno , who verbally acknowledged these results. Electronically Signed   By: Rise Mu M.D.   On: 07/17/2016 01:22    Assessment & Plan:   There are no diagnoses linked to this encounter. I am having Ms. Morre maintain her vitamin B-12, glucose blood, ONETOUCH DELICA LANCETS FINE, carvedilol, aspirin EC, cholecalciferol, acetaminophen, levETIRAcetam, oxybutynin, cloNIDine, meclizine, predniSONE, and escitalopram.  No orders of the defined types were placed in this encounter.    Follow-up: No Follow-up on file.  [name], MD

## 2016-09-24 ENCOUNTER — Encounter: Payer: Self-pay | Admitting: Neurology

## 2016-09-25 ENCOUNTER — Other Ambulatory Visit: Payer: Self-pay | Admitting: *Deleted

## 2016-09-25 NOTE — Patient Outreach (Signed)
[facility]) Care Management  09/25/2016  [name] 11-Jun-1933 [id number]   CSW will perform a case closure on patient, due to inability to establish initial phone contact with patient, despite required number of phone attempts made and outreach letter mailed to patient's home, allowing 10 business days for a response.  CSW will notify patient's RNCM with [facility] Care Management, Nigel Mormon of CSW's plans to close patient's case.  CSW will fax an update to patient's Primary Care Physician, Dr. Macarthur Critchley Plotnikov to ensure that they are aware of CSW's involvement with patient's plan of care.  CSW will submit a case closure request to [name], Care Management Assistant with [facility], in the form of an In [employer].  CSW will ensure that [name] is aware of [name]'s, RNCM with [facility], continued involvement with patient's care. Danford Bad, BSW, MSW, LCSW  Licensed Restaurant manager, fast food [facility]  Mailing [place] [address], [place], [place] [address] Physical Address-[address], [place], [place] [address] Toll Free Main # [phone] Fax # [phone] Cell # [phone]  Fax # [phone]  [email or web address] [email or web address]

## 2016-10-01 ENCOUNTER — Ambulatory Visit: Payer: Commercial Managed Care - HMO | Admitting: Internal Medicine

## 2016-10-19 ENCOUNTER — Other Ambulatory Visit: Payer: Self-pay

## 2016-10-19 ENCOUNTER — Other Ambulatory Visit: Payer: Self-pay | Admitting: *Deleted

## 2016-10-19 MED ORDER — MECLIZINE HCL 25 MG PO TABS: 12.5000 mg | ORAL_TABLET | Freq: Three times a day (TID) | ORAL | 3 refills | Status: DC | PRN

## 2016-10-19 NOTE — Telephone Encounter (Signed)
Rec'd call pt requesting refill on meclizine for her dizziness. Inform will send script to [facility]...[name]

## 2016-10-19 NOTE — Patient Outreach (Signed)
     Unsuccessful attempt made to contact patient via telephone.  Plan: Discharge patient from caseload due to inability to make contact with patient in the past.

## 2016-10-27 ENCOUNTER — Emergency Department (HOSPITAL_COMMUNITY): Payer: Commercial Managed Care - HMO

## 2016-10-27 ENCOUNTER — Emergency Department (HOSPITAL_COMMUNITY)
Admission: EM | Admit: 2016-10-27 | Discharge: 2016-10-27 | Disposition: A | Discharge status: Home / Self Care | Payer: Commercial Managed Care - HMO | Attending: Emergency Medicine | Admitting: Emergency Medicine

## 2016-10-27 ENCOUNTER — Encounter (HOSPITAL_COMMUNITY): Payer: Self-pay | Admitting: Emergency Medicine

## 2016-10-27 DIAGNOSIS — K5900 Constipation, unspecified: Secondary | ICD-10-CM | POA: Diagnosis not present

## 2016-10-27 DIAGNOSIS — R1010 Upper abdominal pain, unspecified: Secondary | ICD-10-CM | POA: Diagnosis not present

## 2016-10-27 DIAGNOSIS — I5031 Acute diastolic (congestive) heart failure: Secondary | ICD-10-CM | POA: Insufficient documentation

## 2016-10-27 DIAGNOSIS — E119 Type 2 diabetes mellitus without complications: Secondary | ICD-10-CM | POA: Insufficient documentation

## 2016-10-27 DIAGNOSIS — R1084 Generalized abdominal pain: Secondary | ICD-10-CM | POA: Diagnosis not present

## 2016-10-27 DIAGNOSIS — Z8673 Personal history of transient ischemic attack (TIA), and cerebral infarction without residual deficits: Secondary | ICD-10-CM | POA: Diagnosis not present

## 2016-10-27 DIAGNOSIS — Z7982 Long term (current) use of aspirin: Secondary | ICD-10-CM | POA: Diagnosis not present

## 2016-10-27 DIAGNOSIS — I2510 Atherosclerotic heart disease of native coronary artery without angina pectoris: Secondary | ICD-10-CM | POA: Insufficient documentation

## 2016-10-27 DIAGNOSIS — Z955 Presence of coronary angioplasty implant and graft: Secondary | ICD-10-CM | POA: Insufficient documentation

## 2016-10-27 DIAGNOSIS — Z79899 Other long term (current) drug therapy: Secondary | ICD-10-CM | POA: Insufficient documentation

## 2016-10-27 DIAGNOSIS — I110 Hypertensive heart disease with heart failure: Secondary | ICD-10-CM | POA: Insufficient documentation

## 2016-10-27 DIAGNOSIS — R109 Unspecified abdominal pain: Secondary | ICD-10-CM

## 2016-10-27 DIAGNOSIS — R1110 Vomiting, unspecified: Secondary | ICD-10-CM | POA: Diagnosis not present

## 2016-10-27 HISTORY — DX: Unspecified convulsions: R56.9

## 2016-10-27 LAB — COMPREHENSIVE METABOLIC PANEL
ALBUMIN: 3.9 g/dL (ref 3.5–5.0)
ALK PHOS: 73 U/L (ref 38–126)
ALT: 16 U/L (ref 14–54)
AST: 38 U/L (ref 15–41)
Anion gap: 8 (ref 5–15)
BUN: 42 mg/dL — AB (ref 6–20)
CALCIUM: 9.2 mg/dL (ref 8.9–10.3)
CHLORIDE: 108 mmol/L (ref 101–111)
CO2: 22 mmol/L (ref 22–32)
CREATININE: 1.05 mg/dL — AB (ref 0.44–1.00)
GFR calc Af Amer: 56 mL/min — ABNORMAL LOW (ref >60)
GFR calc non Af Amer: 48 mL/min — ABNORMAL LOW (ref >60)
GLUCOSE: 100 mg/dL — AB (ref 65–99)
Potassium: 5.5 mmol/L — ABNORMAL HIGH (ref 3.5–5.1)
SODIUM: 138 mmol/L (ref 135–145)
Total Bilirubin: 0.4 mg/dL (ref 0.3–1.2)
Total Protein: 7.3 g/dL (ref 6.5–8.1)

## 2016-10-27 LAB — CBC WITH DIFFERENTIAL/PLATELET
BASOS ABS: 0 10*3/uL (ref 0.0–0.1)
BASOS PCT: 0 %
EOS ABS: 0 10*3/uL (ref 0.0–0.7)
Eosinophils Relative: 1 %
HCT: 27.4 % — ABNORMAL LOW (ref 36.0–46.0)
HEMOGLOBIN: 9 g/dL — AB (ref 12.0–15.0)
LYMPHS ABS: 1 10*3/uL (ref 0.7–4.0)
Lymphocytes Relative: 17 %
MCH: 30 pg (ref 26.0–34.0)
MCHC: 32.8 g/dL (ref 30.0–36.0)
MCV: 91.3 fL (ref 78.0–100.0)
Monocytes Absolute: 0.5 10*3/uL (ref 0.1–1.0)
Monocytes Relative: 8 %
NEUTROS PCT: 74 %
Neutro Abs: 4.2 10*3/uL (ref 1.7–7.7)
PLATELETS: 230 10*3/uL (ref 150–400)
RBC: 3 MIL/uL — AB (ref 3.87–5.11)
RDW: 12.4 % (ref 11.5–15.5)
WBC: 5.7 10*3/uL (ref 4.0–10.5)

## 2016-10-27 LAB — LIPASE, BLOOD: LIPASE: 29 U/L (ref 11–51)

## 2016-10-27 MED ORDER — SODIUM CHLORIDE 0.9 % IV BOLUS (SEPSIS)
1000.0000 mL | Freq: Once | INTRAVENOUS | Status: AC
  Administered 2016-10-27: 1000 mL via INTRAVENOUS

## 2016-10-27 MED ORDER — ONDANSETRON HCL 4 MG/2ML IJ SOLN
4.0000 mg | Freq: Once | INTRAMUSCULAR | Status: AC
  Administered 2016-10-27: 4 mg via INTRAVENOUS
  Filled 2016-10-27: qty 2

## 2016-10-27 NOTE — ED Notes (Signed)
Delay in labs, pt in Xray

## 2016-10-27 NOTE — ED Notes (Signed)
Pt will not let me stick her until RN tries to pull from line.  RN is aware.

## 2016-10-27 NOTE — ED Triage Notes (Signed)
Pt here via EMS from her hairstylist. Pt states she had sudden onset abdominal pain in the upper middle and left side. Pt states she had similar pain prior to her GI surgery for a SBO about a year ago. Had one episode of emesis with EMS and pt sates her 10/10 abdominal pain then went away. Pt currently has no complaints. Pt states she has been constipated for a few days. LBM was yesterday, but was small.

## 2016-10-27 NOTE — ED Provider Notes (Signed)
[facility]-EMERGENCY DEPT Provider Note   CSN: [id number] Arrival date & time: 10/27/16  1406     History   Chief Complaint Chief Complaint  Patient presents with  . Abdominal Pain    HPI [name] is a 82 y.o. female.  Upper abdominal pain approximately 2 hours ago with one episode of emesis. She is chronically constipated. She had an SBO one year ago requiring surgery. She is not eating a lot, but this is normal. Normal urination. She feels better at this point in time. No fever, sweats, chills, chest pain, dyspnea. Severity is mild to moderate.      Past Medical History:  Diagnosis Date  . Anemia    iron deficiency  . Aneurysm, thoracic aortic (HCC)   . Anxiety   . B12 deficiency   . CAD (coronary artery disease)    s/p stenting of LAD 1999- cath 5-08 EF normal LAD 30-40% restenosis. D1 50% D2 80% LCX & RCA minimal plaque  . Chronic back pain   . Constipation   . Depression   . Diabetes mellitus   . DVT (deep venous thrombosis) (HCC)   . GERD (gastroesophageal reflux disease)   . Gout   . HTN (hypertension)   . Hyperlipemia   . Obesity   . Osteoporosis   . Pancreatitis   . Polyarthritis    DJD/ possible PMR  . Renal insufficiency    Cr 1.2-1.3  . Seizures (HCC)   . Tinnitus   . Urinary frequency   . Vertigo   . Vitamin D deficiency     Patient Active Problem List   Diagnosis Date Noted  . Seizures (HCC) 07/17/2016  . Hypertensive urgency 07/17/2016  . Acute diastolic CHF (congestive heart failure) (HCC) 07/17/2016  . UTI (urinary tract infection) 02/28/2016  . Urinary incontinence 10/28/2015  . Alopecia 08/12/2015  . TIA (transient ischemic attack) 07/01/2015  . Acute encephalopathy 06/24/2015  . Protein-calorie malnutrition, severe (HCC) 04/19/2015  . Fever   . Abdominal abscess (HCC)   . Pressure ulcer 04/07/2015  . AKI (acute kidney injury) (HCC)   . Sepsis (HCC)   . Stroke (HCC)   . Facial droop   . Malnutrition of moderate degree  (HCC) 03/20/2015  . Small bowel obstruction s/p exlap/LOA/decompression 03/25/2015 03/18/2015  . Essential hypertension 03/18/2015  . Noncompliance w/medication treatment due to intermit use of medication 12/07/2014  . Fall against object 06/24/2014  . Contusion of left hand 06/24/2014  . Contusion of left hip 06/24/2014  . Loss of weight 04/12/2014  . Malignant hypertension with renal failure and congestive heart failure (HCC) 09/23/2013  . Knee pain, bilateral 07/27/2013  . Chronic fatigue disorder 01/19/2013  . Vaginitis due to Candida 09/02/2012  . Sinusitis 09/02/2012  . Anemia in other chronic diseases classified elsewhere 09/02/2012  . Polymyalgia rheumatica (HCC) 09/10/2011  . Vertigo 08/21/2011  . Fatigue 05/11/2011  . ANEMIA OF CHRONIC DISEASE 09/06/2010  . KNEE PAIN, CHRONIC 05/26/2010  . DIZZINESS 05/11/2010  . SHOULDER PAIN 04/25/2010  . FREQUENCY, URINARY 03/16/2010  . CONSTIPATION, CHRONIC 01/17/2010  . FOOT PAIN 01/17/2009  . RASH AND OTHER NONSPECIFIC SKIN ERUPTION 01/17/2009  . TINNITUS NOS 10/18/2008  . B12 deficiency 08/10/2008  . LOW BACK PAIN 06/23/2008  . CHEST WALL PAIN 06/23/2008  . DYSURIA 03/18/2008  . Abdominal pain 03/18/2008  . HYPERLIPIDEMIA 12/24/2007  . DYSPNEA 12/24/2007  . Anxiety state 12/07/2007  . GERD 12/07/2007  . CHEST PAIN 12/07/2007  . OTHER SPEC FORMS  CHRONIC ISCHEMIC HEART DISEASE 12/01/2007  . Type 2 diabetes mellitus with diabetic nephropathy, without long-term current use of insulin (HCC) 11/28/2007  . Pain in joint 11/28/2007  . GOUT 09/17/2007  . Adjustment disorder with mixed anxiety and depressed mood 09/17/2007  . Coronary atherosclerosis 09/17/2007  . Disorder resulting from impaired renal function 09/17/2007  . OSTEOPOROSIS 09/17/2007  . DVT, HX OF 09/17/2007  . PANCREATITIS, HX OF 09/17/2007    Past Surgical History:  Procedure Laterality Date  . ABDOMINAL HYSTERECTOMY    . CARDIAC CATHETERIZATION  2008   L  main 20%, LAD stent patent, D1 50%, D2 80% (small), RCA 20%, EF 55-60%  . CHOLECYSTECTOMY    . CORONARY ANGIOPLASTY WITH STENT PLACEMENT  1999   LAD stent  . HEMORRHOID SURGERY    . LAPAROSCOPIC LYSIS OF ADHESIONS N/A 03/24/2015   Procedure: LAPAROSCOPIC LYSIS OF ADHESIONS;  Surgeon: Luretha Murphy, MD;  Location: [facility] ORS;  Service: General;  Laterality: N/A;  . LAPAROSCOPY N/A 03/24/2015   Procedure: LAPAROSCOPY DIAGNOSTIC;  Surgeon: Luretha Murphy, MD;  Location: [facility] ORS;  Service: General;  Laterality: N/A;  . LAPAROTOMY N/A 03/24/2015   Procedure: LAPAROTOMY with decompression of bowel;  Surgeon: Luretha Murphy, MD;  Location: [facility] ORS;  Service: General;  Laterality: N/A;  . TUBAL LIGATION      OB History    No data available       Home Medications    Prior to Admission medications   Medication Sig Start Date End Date Taking? Authorizing Provider  acetaminophen (TYLENOL) 650 MG CR tablet Take 650 mg by mouth daily.   Yes Historical Provider, MD  aspirin EC 325 MG tablet Take 325 mg by mouth daily.   Yes Historical Provider, MD  carvedilol (COREG) 25 MG tablet Take 1 tablet (25 mg total) by mouth 2 (two) times daily with a meal. 09/21/16  Yes Tresa Garter, MD  cholecalciferol (VITAMIN D) 1000 units tablet Take 1,000 Units by mouth daily.   Yes Historical Provider, MD  cloNIDine (CATAPRES) 0.1 MG tablet Take 1 tablet (0.1 mg total) by mouth 2 (two) times daily. 09/21/16  Yes Tresa Garter, MD  levETIRAcetam (KEPPRA) 500 MG tablet Take 1 tablet (500 mg total) by mouth 2 (two) times daily. 09/21/16  Yes [name] Plotnikov, MD  meclizine (ANTIVERT) 25 MG tablet Take 0.5-1 tablets (12.5-25 mg total) by mouth 3 (three) times daily as needed for dizziness. Patient taking differently: Take 25 mg by mouth 2 (two) times daily.  10/19/16  Yes [name] Plotnikov, MD  oxybutynin (DITROPAN) 5 MG tablet Take 1 tablet (5 mg total) by mouth 2 (two) times daily. 09/21/16  Yes Tresa Garter, MD  predniSONE (DELTASONE) 5 MG tablet Take 1 tablet (5 mg total) by mouth daily with breakfast. 09/21/16  Yes Tresa Garter, MD  vitamin B-12 (CYANOCOBALAMIN) 1000 MCG tablet Take 1,000 mcg by mouth daily.   Yes Historical Provider, MD  glucose blood (ONETOUCH VERIO) test strip Use as instructed 09/21/16   Tresa Garter, MD  [facility] DELICA LANCETS FINE MISC 1 Device by Does not apply route daily as needed. 09/21/16   Tresa Garter, MD    Family History Family History  Problem Relation Age of Onset  . Adopted: Yes  . Diabetes Mother   . Hypertension Father     Social History Social History  Substance Use Topics  . Smoking status: Never Smoker  . Smokeless tobacco: Never Used  .  Alcohol use No     Allergies   Amlodipine besylate; Atenolol; Benazepril; Benicar [olmesartan medoxomil]; Cozaar; Hydralazine; Hydrochlorothiazide w-triamterene; Hydrocodone; Hydroxyzine pamoate; Iodine; Lisinopril; Peach flavor; Penicillins; Pravastatin; Prednisolone; Strawberry flavor; Tramadol hcl; and Benadryl [diphenhydramine]   Review of Systems Review of Systems  All other systems reviewed and are negative.    Physical Exam Updated Vital Signs BP 169/93   Pulse 60   Temp 98.5 F (36.9 C)   Resp 12   Ht 5\' 5"  (1.651 m)   Wt 151 lb (68.5 kg)   SpO2 95%   BMI 25.13 kg/m   Physical Exam  Constitutional: She is oriented to person, place, and time.  Slightly dehydrated  HENT:  Head: Normocephalic and atraumatic.  Eyes: Conjunctivae are normal.  Neck: Neck supple.  Cardiovascular: Normal rate and regular rhythm.   Pulmonary/Chest: Effort normal and breath sounds normal.  Abdominal: Soft. Bowel sounds are normal.  Musculoskeletal: Normal range of motion.  Neurological: She is alert and oriented to person, place, and time.  Skin: Skin is warm and dry.  Psychiatric: She has a normal mood and affect. Her behavior is normal.  Nursing note and vitals  reviewed.    ED Treatments / Results  Labs (all labs ordered are listed, but only abnormal results are displayed) Labs Reviewed  CBC WITH DIFFERENTIAL/PLATELET - Abnormal; Notable for the following:       Result Value   RBC 3.00 (*)    Hemoglobin 9.0 (*)    HCT 27.4 (*)    All other components within normal limits  COMPREHENSIVE METABOLIC PANEL - Abnormal; Notable for the following:    Potassium 5.5 (*)    Glucose, Bld 100 (*)    BUN 42 (*)    Creatinine, Ser 1.05 (*)    GFR calc non Af Amer 48 (*)    GFR calc Af Amer 56 (*)    All other components within normal limits  LIPASE, BLOOD    EKG  EKG Interpretation None       Radiology Dg Abdomen Acute W/chest  Result Date: 10/27/2016 CLINICAL DATA:  Abdominal pain. EXAM: DG ABDOMEN ACUTE W/ 1V CHEST COMPARISON:  Chest x-ray 07/17/2016 FINDINGS: Nonobstructive bowel gas pattern. Large stool burden throughout the colon. Prior cholecystectomy. No organomegaly or free air. Heart is borderline in size. No confluent airspace opacity or effusion. No acute bony abnormality. IMPRESSION: Large stool burden.  No obstruction or free air. No acute cardiopulmonary disease. Electronically Signed   By: 09/16/2016 M.D.   On: 10/27/2016 16:15    Procedures Procedures (including critical care time)  Medications Ordered in ED Medications  sodium chloride 0.9 % bolus 1,000 mL (0 mLs Intravenous Stopped 10/27/16 1816)  ondansetron (ZOFRAN) injection 4 mg (4 mg Intravenous Given 10/27/16 1609)     Initial Impression / Assessment and Plan / ED Course  I have reviewed the triage vital signs and the nursing notes.  Pertinent labs & imaging results that were available during my care of the patient were reviewed by me and considered in my medical decision making (see chart for details).  Clinical Course     No acute abdomen. Patient feels better after IV fluids. Plain films of abdomen reveal a large stool burden, but no free air or  evidence of a small bowel obstruction. Discussed constipation treatments with the patient and her daughters.  Final Clinical Impressions(s) / ED Diagnoses   Final diagnoses:  Abdominal pain, unspecified abdominal location  Constipation, unspecified constipation  type    New Prescriptions New Prescriptions   No medications on file     Donnetta Hutching, MD 10/27/16 1844

## 2016-10-27 NOTE — Discharge Instructions (Signed)
Increase fluid, fruit, fiber. Can also try a fleets enema, Miralax, magnesium citrate. Follow-up your primary care doctor.

## 2016-11-02 ENCOUNTER — Ambulatory Visit: Payer: Commercial Managed Care - HMO | Admitting: Internal Medicine

## 2016-11-16 ENCOUNTER — Ambulatory Visit: Payer: Commercial Managed Care - HMO | Admitting: Neurology

## 2016-11-28 ENCOUNTER — Ambulatory Visit: Payer: Commercial Managed Care - HMO | Admitting: Internal Medicine

## 2016-12-11 ENCOUNTER — Ambulatory Visit (INDEPENDENT_AMBULATORY_CARE_PROVIDER_SITE_OTHER): Payer: Medicare HMO | Admitting: Internal Medicine

## 2016-12-11 ENCOUNTER — Encounter: Payer: Self-pay | Admitting: Internal Medicine

## 2016-12-11 ENCOUNTER — Other Ambulatory Visit (INDEPENDENT_AMBULATORY_CARE_PROVIDER_SITE_OTHER): Payer: Medicare HMO

## 2016-12-11 VITALS — BP 162/80 | HR 58 | Wt 157.0 lb

## 2016-12-11 DIAGNOSIS — M10079 Idiopathic gout, unspecified ankle and foot: Secondary | ICD-10-CM

## 2016-12-11 DIAGNOSIS — I10 Essential (primary) hypertension: Secondary | ICD-10-CM

## 2016-12-11 DIAGNOSIS — R739 Hyperglycemia, unspecified: Secondary | ICD-10-CM

## 2016-12-11 DIAGNOSIS — R42 Dizziness and giddiness: Secondary | ICD-10-CM

## 2016-12-11 DIAGNOSIS — E538 Deficiency of other specified B group vitamins: Secondary | ICD-10-CM | POA: Diagnosis not present

## 2016-12-11 DIAGNOSIS — M353 Polymyalgia rheumatica: Secondary | ICD-10-CM | POA: Diagnosis not present

## 2016-12-11 DIAGNOSIS — I5031 Acute diastolic (congestive) heart failure: Secondary | ICD-10-CM

## 2016-12-11 DIAGNOSIS — G8929 Other chronic pain: Secondary | ICD-10-CM

## 2016-12-11 DIAGNOSIS — I2510 Atherosclerotic heart disease of native coronary artery without angina pectoris: Secondary | ICD-10-CM | POA: Diagnosis not present

## 2016-12-11 DIAGNOSIS — I6389 Other cerebral infarction: Secondary | ICD-10-CM

## 2016-12-11 DIAGNOSIS — I638 Other cerebral infarction: Secondary | ICD-10-CM

## 2016-12-11 DIAGNOSIS — M545 Low back pain: Secondary | ICD-10-CM

## 2016-12-11 LAB — BASIC METABOLIC PANEL
BUN: 40 mg/dL — ABNORMAL HIGH (ref 6–23)
CALCIUM: 9.8 mg/dL (ref 8.4–10.5)
CO2: 26 mEq/L (ref 19–32)
Chloride: 105 mEq/L (ref 96–112)
Creatinine, Ser: 1.27 mg/dL — ABNORMAL HIGH (ref 0.40–1.20)
GFR: 51.85 mL/min — AB (ref >60.00)
Glucose, Bld: 126 mg/dL — ABNORMAL HIGH (ref 70–99)
Potassium: 5.8 mEq/L — ABNORMAL HIGH (ref 3.5–5.1)
SODIUM: 134 meq/L — AB (ref 135–145)

## 2016-12-11 LAB — HEPATIC FUNCTION PANEL
ALT: 8 U/L (ref 0–35)
AST: 14 U/L (ref 0–37)
Albumin: 4.2 g/dL (ref 3.5–5.2)
Alkaline Phosphatase: 74 U/L (ref 39–117)
BILIRUBIN DIRECT: 0.1 mg/dL (ref 0.0–0.3)
BILIRUBIN TOTAL: 0.4 mg/dL (ref 0.2–1.2)
Total Protein: 8.4 g/dL — ABNORMAL HIGH (ref 6.0–8.3)

## 2016-12-11 LAB — SEDIMENTATION RATE: SED RATE: 45 mm/h — AB (ref 0–30)

## 2016-12-11 LAB — HEMOGLOBIN A1C: Hgb A1c MFr Bld: 6.8 % — ABNORMAL HIGH (ref 4.6–6.5)

## 2016-12-11 MED ORDER — CYANOCOBALAMIN 1000 MCG/ML IJ SOLN
1000.0000 ug | Freq: Once | INTRAMUSCULAR | Status: AC
  Administered 2016-12-11: 1000 ug via INTRAMUSCULAR

## 2016-12-11 NOTE — Progress Notes (Signed)
Pre visit review using our clinic review tool, if applicable. No additional management support is needed unless otherwise documented below in the visit note. 

## 2016-12-11 NOTE — Assessment & Plan Note (Signed)
Risks associated with treatment noncompliance were discussed. Compliance was encouraged. 

## 2016-12-11 NOTE — Addendum Note (Signed)
Addended by: Deatra James on: 12/11/2016 04:22 PM   Modules accepted: Orders

## 2016-12-11 NOTE — Assessment & Plan Note (Signed)
On Prednisone 

## 2016-12-11 NOTE — Assessment & Plan Note (Signed)
Appt w/Dr Karel Jarvis - reschedule due to a snow day  Treat HTN

## 2016-12-11 NOTE — Assessment & Plan Note (Signed)
On PO B12 

## 2016-12-11 NOTE — Assessment & Plan Note (Signed)
Coreg

## 2016-12-11 NOTE — Assessment & Plan Note (Signed)
Appt w/Dr Karel Jarvis - reschedule due to a snow day On ASA Treat HTN

## 2016-12-11 NOTE — Assessment & Plan Note (Signed)
On ASA 

## 2016-12-11 NOTE — Progress Notes (Signed)
Subjective:  Patient ID: [name], female    DOB: 11-Feb-1933  Age: 83 y.o. MRN: [id number]  CC: Follow-up (6 week)   HPI [name] presents for PMR, memory loss, dizziness, LBP f/u  Outpatient Medications Prior to Visit  Medication Sig Dispense Refill  . acetaminophen (TYLENOL) 650 MG CR tablet Take 650 mg by mouth daily.    [name] aspirin EC 325 MG tablet Take 325 mg by mouth daily.    . carvedilol (COREG) 25 MG tablet Take 1 tablet (25 mg total) by mouth 2 (two) times daily with a meal. 180 tablet 3  . cholecalciferol (VITAMIN D) 1000 units tablet Take 1,000 Units by mouth daily.    . cloNIDine (CATAPRES) 0.1 MG tablet Take 1 tablet (0.1 mg total) by mouth 2 (two) times daily. 60 tablet 11  . glucose blood (ONETOUCH VERIO) test strip Use as instructed 50 each 11  . levETIRAcetam (KEPPRA) 500 MG tablet Take 1 tablet (500 mg total) by mouth 2 (two) times daily. 180 tablet 3  . meclizine (ANTIVERT) 25 MG tablet Take 0.5-1 tablets (12.5-25 mg total) by mouth 3 (three) times daily as needed for dizziness. (Patient taking differently: Take 25 mg by mouth 2 (two) times daily. ) 60 tablet 3  . ONETOUCH DELICA LANCETS FINE MISC 1 Device by Does not apply route daily as needed. 100 each 3  . oxybutynin (DITROPAN) 5 MG tablet Take 1 tablet (5 mg total) by mouth 2 (two) times daily. 180 tablet 3  . predniSONE (DELTASONE) 5 MG tablet Take 1 tablet (5 mg total) by mouth daily with breakfast. 90 tablet 1  . vitamin B-12 (CYANOCOBALAMIN) 1000 MCG tablet Take 1,000 mcg by mouth daily.     No facility-administered medications prior to visit.     ROS Review of Systems  Constitutional: Positive for fatigue. Negative for activity change, appetite change, chills and unexpected weight change.  HENT: Negative for congestion, mouth sores and sinus pressure.   Eyes: Negative for visual disturbance.  Respiratory: Negative for cough and chest tightness.   Gastrointestinal: Negative for abdominal  pain and nausea.  Genitourinary: Negative for difficulty urinating, frequency and vaginal pain.  Musculoskeletal: Positive for arthralgias, back pain, gait problem and neck pain.  Skin: Negative for pallor and rash.  Neurological: Positive for dizziness and weakness. Negative for tremors, numbness and headaches.  Psychiatric/Behavioral: Positive for sleep disturbance. Negative for confusion and suicidal ideas. The patient is nervous/anxious.     Objective:  BP (!) 162/80 (BP Location: Left Arm)   Pulse (!) 58   Wt 157 lb (71.2 kg)   BMI 26.13 kg/m   BP Readings from Last 3 Encounters:  12/11/16 (!) 162/80  10/27/16 169/93  09/21/16 (!) 150/80    Wt Readings from Last 3 Encounters:  12/11/16 157 lb (71.2 kg)  10/27/16 151 lb (68.5 kg)  09/21/16 151 lb (68.5 kg)    Physical Exam  Constitutional: She appears well-developed. No distress.  HENT:  Head: Normocephalic.  Right Ear: External ear normal.  Left Ear: External ear normal.  Nose: Nose normal.  Mouth/Throat: Oropharynx is clear and moist.  Eyes: Conjunctivae are normal. Pupils are equal, round, and reactive to light. Right eye exhibits no discharge. Left eye exhibits no discharge.  Neck: Normal range of motion. Neck supple. No JVD present. No tracheal deviation present. No thyromegaly present.  Cardiovascular: Normal rate, regular rhythm and normal heart sounds.   Pulmonary/Chest: No stridor. No respiratory distress. She has  no wheezes.  Abdominal: Soft. Bowel sounds are normal. She exhibits no distension and no mass. There is no tenderness. There is no rebound and no guarding.  Musculoskeletal: She exhibits tenderness. She exhibits no edema.  Lymphadenopathy:    She has no cervical adenopathy.  Neurological: She displays normal reflexes. No cranial nerve deficit. She exhibits normal muscle tone. Coordination abnormal.  Skin: No rash noted. No erythema.  Psychiatric: She has a normal mood and affect. Her behavior is  normal. Judgment and thought content normal.  ataxic  Stiff  Lab Results  Component Value Date   WBC 5.7 10/27/2016   HGB 9.0 (L) 10/27/2016   HCT 27.4 (L) 10/27/2016   PLT 230 10/27/2016   GLUCOSE 100 (H) 10/27/2016   CHOL 169 06/25/2015   TRIG 61 06/25/2015   HDL 57 06/25/2015   LDLDIRECT 114.8 01/10/2011   LDLCALC 100 (H) 06/25/2015   ALT 16 10/27/2016   AST 38 10/27/2016   NA 138 10/27/2016   K 5.5 (H) 10/27/2016   CL 108 10/27/2016   CREATININE 1.05 (H) 10/27/2016   BUN 42 (H) 10/27/2016   CO2 22 10/27/2016   TSH 1.59 08/12/2015   INR 1.09 07/17/2016   HGBA1C 6.1 07/31/2016    Dg Abdomen Acute W/chest  Result Date: 10/27/2016 CLINICAL DATA:  Abdominal pain. EXAM: DG ABDOMEN ACUTE W/ 1V CHEST COMPARISON:  Chest x-ray 07/17/2016 FINDINGS: Nonobstructive bowel gas pattern. Large stool burden throughout the colon. Prior cholecystectomy. No organomegaly or free air. Heart is borderline in size. No confluent airspace opacity or effusion. No acute bony abnormality. IMPRESSION: Large stool burden.  No obstruction or free air. No acute cardiopulmonary disease. Electronically Signed   By: Charlett Nose M.D.   On: 10/27/2016 16:15    Assessment & Plan:   There are no diagnoses linked to this encounter. I am having [name] maintain her vitamin B-12, aspirin EC, cholecalciferol, acetaminophen, carvedilol, levETIRAcetam, oxybutynin, predniSONE, glucose blood, ONETOUCH DELICA LANCETS FINE, cloNIDine, and meclizine.  No orders of the defined types were placed in this encounter.    Follow-up: No Follow-up on file.  [name], MD

## 2016-12-11 NOTE — Assessment & Plan Note (Signed)
On Steroids  Potential benefits of a long term steroids were explained to the patient and were aknowledged.

## 2016-12-12 ENCOUNTER — Telehealth: Payer: Self-pay

## 2016-12-12 DIAGNOSIS — E875 Hyperkalemia: Secondary | ICD-10-CM

## 2016-12-12 NOTE — Telephone Encounter (Signed)
error 

## 2016-12-26 ENCOUNTER — Telehealth: Payer: Self-pay | Admitting: *Deleted

## 2016-12-26 MED ORDER — MECLIZINE HCL 25 MG PO TABS: 12.5000 mg | ORAL_TABLET | Freq: Three times a day (TID) | ORAL | 0 refills | Status: DC | PRN

## 2016-12-26 MED ORDER — CLONIDINE HCL 0.1 MG PO TABS
0.1000 mg | ORAL_TABLET | Freq: Two times a day (BID) | ORAL | 2 refills | Status: DC
Start: 2016-12-26 — End: 2017-04-15

## 2016-12-26 NOTE — Telephone Encounter (Signed)
Rec'd call pt requesting refills on her clonidine & meclizine to be sent to [facility]. Inform pt will send electronically...Raechel Chute

## 2017-01-04 NOTE — Telephone Encounter (Signed)
PA - Meclizine and Clonidine requested.

## 2017-01-07 NOTE — Telephone Encounter (Signed)
Meclizine HCL 25mg  PA started (Key )   Clonidine PA started (Key: [id number])

## 2017-01-08 NOTE — Telephone Encounter (Signed)
PA for Meclizine was denied.   PA for Clonidine was n/a. Stated that medication is covered.   Tried to call patient but there is no voicemail to leave a message.

## 2017-01-09 NOTE — Telephone Encounter (Signed)
PA for  Meclizine came back approved and the clonidine did not need a PA.  Pt informed of same.

## 2017-01-10 NOTE — Telephone Encounter (Signed)
Pt called back stated that [employer] is making her pay $40 for the Meclizine even with the PA approve. Please check and give her a call back.

## 2017-01-11 NOTE — Telephone Encounter (Signed)
LVM for pt to call back as soon as possible.   

## 2017-02-20 ENCOUNTER — Other Ambulatory Visit: Payer: Self-pay | Admitting: *Deleted

## 2017-02-20 MED ORDER — ACCU-CHEK AVIVA VI SOLN: 1 refills | Status: DC

## 2017-02-20 MED ORDER — BD SWAB SINGLE USE REGULAR PADS: MEDICATED_PAD | 1 refills | Status: DC

## 2017-02-25 ENCOUNTER — Ambulatory Visit: Payer: Medicare HMO | Admitting: Internal Medicine

## 2017-03-11 ENCOUNTER — Ambulatory Visit: Payer: Medicare HMO | Admitting: Internal Medicine

## 2017-04-15 ENCOUNTER — Telehealth: Payer: Self-pay | Admitting: Internal Medicine

## 2017-04-15 MED ORDER — CLONIDINE HCL 0.1 MG PO TABS: 0.1000 mg | ORAL_TABLET | Freq: Two times a day (BID) | ORAL | 0 refills | Status: DC

## 2017-04-15 MED ORDER — CLONIDINE HCL 0.1 MG PO TABS
0.1000 mg | ORAL_TABLET | Freq: Two times a day (BID) | ORAL | 0 refills | Status: DC
Start: 2017-04-15 — End: 2017-04-23

## 2017-04-15 NOTE — Telephone Encounter (Signed)
Sent [facility] to [facility]...Raechel Chute

## 2017-04-15 NOTE — Telephone Encounter (Signed)
Patient is waiting on refills from [facility] order.  She is requesting a script for clonidine for a few days  to be sent to [place] at [facility].

## 2017-04-15 NOTE — Addendum Note (Signed)
Addended by: Deatra James on: 04/15/2017 01:04 PM   Modules accepted: Orders

## 2017-04-23 ENCOUNTER — Ambulatory Visit (INDEPENDENT_AMBULATORY_CARE_PROVIDER_SITE_OTHER): Payer: Medicare HMO | Admitting: Internal Medicine

## 2017-04-23 ENCOUNTER — Encounter: Payer: Self-pay | Admitting: Internal Medicine

## 2017-04-23 ENCOUNTER — Other Ambulatory Visit (INDEPENDENT_AMBULATORY_CARE_PROVIDER_SITE_OTHER): Payer: Medicare HMO

## 2017-04-23 DIAGNOSIS — I6389 Other cerebral infarction: Secondary | ICD-10-CM

## 2017-04-23 DIAGNOSIS — E1121 Type 2 diabetes mellitus with diabetic nephropathy: Secondary | ICD-10-CM | POA: Diagnosis not present

## 2017-04-23 DIAGNOSIS — I638 Other cerebral infarction: Secondary | ICD-10-CM

## 2017-04-23 DIAGNOSIS — M353 Polymyalgia rheumatica: Secondary | ICD-10-CM | POA: Diagnosis not present

## 2017-04-23 DIAGNOSIS — I10 Essential (primary) hypertension: Secondary | ICD-10-CM | POA: Diagnosis not present

## 2017-04-23 LAB — BASIC METABOLIC PANEL
BUN: 32 mg/dL — ABNORMAL HIGH (ref 6–23)
CALCIUM: 10.1 mg/dL (ref 8.4–10.5)
CHLORIDE: 105 meq/L (ref 96–112)
CO2: 27 meq/L (ref 19–32)
Creatinine, Ser: 1.13 mg/dL (ref 0.40–1.20)
GFR: 59.28 mL/min — ABNORMAL LOW (ref >60.00)
Glucose, Bld: 95 mg/dL (ref 70–99)
POTASSIUM: 4.8 meq/L (ref 3.5–5.1)
Sodium: 137 mEq/L (ref 135–145)

## 2017-04-23 LAB — SEDIMENTATION RATE: SED RATE: 64 mm/h — AB (ref 0–30)

## 2017-04-23 LAB — GLUCOSE, POCT (MANUAL RESULT ENTRY): POC Glucose: 118 mg/dl — AB (ref 70–99)

## 2017-04-23 LAB — HEMOGLOBIN A1C: Hgb A1c MFr Bld: 7.3 % — ABNORMAL HIGH (ref 4.6–6.5)

## 2017-04-23 MED ORDER — CLONIDINE HCL 0.1 MG PO TABS: 0.1000 mg | ORAL_TABLET | Freq: Two times a day (BID) | ORAL | 11 refills | Status: DC

## 2017-04-23 MED ORDER — ZOSTER VAC RECOMB ADJUVANTED 50 MCG/0.5ML IM SUSR: 0.5000 mL | Freq: Once | INTRAMUSCULAR | 1 refills | Status: AC

## 2017-04-23 NOTE — Assessment & Plan Note (Signed)
On ASA 

## 2017-04-23 NOTE — Progress Notes (Signed)
Subjective:  Patient ID: [name], female    DOB: 03-29-1933  Age: 83 y.o. MRN: [id number]  CC: No chief complaint on file.   HPI [name] presents for CFS, FMS, PMR on Prednisone, DM  Outpatient Medications Prior to Visit  Medication Sig Dispense Refill  . Alcohol Swabs (B-D SINGLE USE SWABS REGULAR) PADS Use to clean area to check blood sugars twice a day 300 each 1  . aspirin EC 325 MG tablet Take 325 mg by mouth daily.    . Blood Glucose Calibration (ACCU-CHEK AVIVA) SOLN Use as directed 3 each 1  . carvedilol (COREG) 25 MG tablet Take 1 tablet (25 mg total) by mouth 2 (two) times daily with a meal. 180 tablet 3  . cholecalciferol (VITAMIN D) 1000 units tablet Take 1,000 Units by mouth daily.    . cloNIDine (CATAPRES) 0.1 MG tablet Take 1 tablet (0.1 mg total) by mouth 2 (two) times daily. 60 tablet 0  . glucose blood (ONETOUCH VERIO) test strip Use as instructed 50 each 11  . levETIRAcetam (KEPPRA) 500 MG tablet Take 1 tablet (500 mg total) by mouth 2 (two) times daily. 180 tablet 3  . meclizine (ANTIVERT) 25 MG tablet Take 0.5-1 tablets (12.5-25 mg total) by mouth 3 (three) times daily as needed for dizziness. 180 tablet 0  . ONETOUCH DELICA LANCETS FINE MISC 1 Device by Does not apply route daily as needed. 100 each 3  . oxybutynin (DITROPAN) 5 MG tablet Take 1 tablet (5 mg total) by mouth 2 (two) times daily. 180 tablet 3  . predniSONE (DELTASONE) 5 MG tablet Take 1 tablet (5 mg total) by mouth daily with breakfast. 90 tablet 1  . vitamin B-12 (CYANOCOBALAMIN) 1000 MCG tablet Take 1,000 mcg by mouth daily.    [name] acetaminophen (TYLENOL) 650 MG CR tablet Take 650 mg by mouth daily.     No facility-administered medications prior to visit.     ROS Review of Systems  Constitutional: Positive for fatigue and unexpected weight change. Negative for activity change, appetite change and chills.  HENT: Negative for congestion, mouth sores and sinus pressure.   Eyes:  Negative for visual disturbance.  Respiratory: Negative for cough and chest tightness.   Gastrointestinal: Negative for abdominal pain and nausea.  Genitourinary: Negative for difficulty urinating, frequency and vaginal pain.  Musculoskeletal: Positive for arthralgias, back pain, gait problem and myalgias.  Skin: Negative for pallor and rash.  Neurological: Positive for weakness. Negative for dizziness, tremors, numbness and headaches.  Psychiatric/Behavioral: Negative for confusion and sleep disturbance. The patient is nervous/anxious.     Objective:  BP 138/72 (BP Location: Left Arm, Patient Position: Sitting, Cuff Size: Large)   Pulse 60   Temp 98.5 F (36.9 C) (Oral)   Ht 5\' 5"  (1.651 m)   Wt 178 lb (80.7 kg)   SpO2 98%   BMI 29.62 kg/m   BP Readings from Last 3 Encounters:  04/23/17 138/72  12/11/16 (!) 162/80  10/27/16 169/93    Wt Readings from Last 3 Encounters:  04/23/17 178 lb (80.7 kg)  12/11/16 157 lb (71.2 kg)  10/27/16 151 lb (68.5 kg)    Physical Exam  Constitutional: She appears well-developed. No distress.  HENT:  Head: Normocephalic.  Right Ear: External ear normal.  Left Ear: External ear normal.  Nose: Nose normal.  Mouth/Throat: Oropharynx is clear and moist.  Eyes: Conjunctivae are normal. Pupils are equal, round, and reactive to light. Right eye exhibits no  discharge. Left eye exhibits no discharge.  Neck: Normal range of motion. Neck supple. No JVD present. No tracheal deviation present. No thyromegaly present.  Cardiovascular: Normal rate, regular rhythm and normal heart sounds.   Pulmonary/Chest: No stridor. No respiratory distress. She has no wheezes.  Abdominal: Soft. Bowel sounds are normal. She exhibits no distension and no mass. There is no tenderness. There is no rebound and no guarding.  Musculoskeletal: She exhibits edema and tenderness.  Lymphadenopathy:    She has no cervical adenopathy.  Neurological: She displays normal reflexes.  No cranial nerve deficit. She exhibits normal muscle tone. Coordination abnormal.  Skin: No rash noted. No erythema.  Psychiatric: She has a normal mood and affect. Her behavior is normal. Judgment and thought content normal.  ankles w/soft <trace edema  Lab Results  Component Value Date   WBC 5.7 10/27/2016   HGB 9.0 (L) 10/27/2016   HCT 27.4 (L) 10/27/2016   PLT 230 10/27/2016   GLUCOSE 126 (H) 12/11/2016   CHOL 169 06/25/2015   TRIG 61 06/25/2015   HDL 57 06/25/2015   LDLDIRECT 114.8 01/10/2011   LDLCALC 100 (H) 06/25/2015   ALT 8 12/11/2016   AST 14 12/11/2016   NA 134 (L) 12/11/2016   K 5.8 (H) 12/11/2016   CL 105 12/11/2016   CREATININE 1.27 (H) 12/11/2016   BUN 40 (H) 12/11/2016   CO2 26 12/11/2016   TSH 1.59 08/12/2015   INR 1.09 07/17/2016   HGBA1C 6.8 (H) 12/11/2016    Dg Abdomen Acute W/chest  Result Date: 10/27/2016 CLINICAL DATA:  Abdominal pain. EXAM: DG ABDOMEN ACUTE W/ 1V CHEST COMPARISON:  Chest x-ray 07/17/2016 FINDINGS: Nonobstructive bowel gas pattern. Large stool burden throughout the colon. Prior cholecystectomy. No organomegaly or free air. Heart is borderline in size. No confluent airspace opacity or effusion. No acute bony abnormality. IMPRESSION: Large stool burden.  No obstruction or free air. No acute cardiopulmonary disease. Electronically Signed   By: Charlett Nose M.D.   On: 10/27/2016 16:15    Assessment & Plan:   There are no diagnoses linked to this encounter. I have discontinued Ms. Amezcua's acetaminophen. I am also having her maintain her vitamin B-12, aspirin EC, cholecalciferol, carvedilol, levETIRAcetam, oxybutynin, predniSONE, glucose blood, ONETOUCH DELICA LANCETS FINE, meclizine, ACCU-CHEK AVIVA, B-D SINGLE USE SWABS REGULAR, and cloNIDine.  No orders of the defined types were placed in this encounter.    Follow-up: No Follow-up on file.  [name], MD

## 2017-04-23 NOTE — Assessment & Plan Note (Addendum)
Not taking Metformin Diab teaching ref Labs

## 2017-04-23 NOTE — Patient Instructions (Addendum)
MC well w/[name] 

## 2017-04-23 NOTE — Assessment & Plan Note (Signed)
Deltasone 5 mg a day We discussed the possibility of tapering off Prednisone down to 0 over a period of time. The pt would like to continue w/5 mg/d

## 2017-04-23 NOTE — Assessment & Plan Note (Signed)
Coreg, Catapress

## 2017-05-02 ENCOUNTER — Other Ambulatory Visit: Payer: Self-pay

## 2017-05-02 MED ORDER — CLONIDINE HCL 0.1 MG PO TABS: 0.1000 mg | ORAL_TABLET | Freq: Two times a day (BID) | ORAL | 3 refills | Status: DC

## 2017-05-02 NOTE — Telephone Encounter (Signed)
RX sent

## 2017-05-02 NOTE — Telephone Encounter (Signed)
Pt never received script from [employer], please resend cloNIDine (CATAPRES) 0.1 MG tablet  To [facility] pharmacy

## 2017-05-06 DIAGNOSIS — H2511 Age-related nuclear cataract, right eye: Secondary | ICD-10-CM | POA: Diagnosis not present

## 2017-05-06 DIAGNOSIS — H02831 Dermatochalasis of right upper eyelid: Secondary | ICD-10-CM | POA: Diagnosis not present

## 2017-05-06 DIAGNOSIS — E119 Type 2 diabetes mellitus without complications: Secondary | ICD-10-CM | POA: Diagnosis not present

## 2017-05-06 DIAGNOSIS — H04123 Dry eye syndrome of bilateral lacrimal glands: Secondary | ICD-10-CM | POA: Diagnosis not present

## 2017-05-06 DIAGNOSIS — Z961 Presence of intraocular lens: Secondary | ICD-10-CM | POA: Diagnosis not present

## 2017-05-06 DIAGNOSIS — H43812 Vitreous degeneration, left eye: Secondary | ICD-10-CM | POA: Diagnosis not present

## 2017-05-06 DIAGNOSIS — H02834 Dermatochalasis of left upper eyelid: Secondary | ICD-10-CM | POA: Diagnosis not present

## 2017-05-06 DIAGNOSIS — H40013 Open angle with borderline findings, low risk, bilateral: Secondary | ICD-10-CM | POA: Diagnosis not present

## 2017-05-06 DIAGNOSIS — H26492 Other secondary cataract, left eye: Secondary | ICD-10-CM | POA: Diagnosis not present

## 2017-06-26 ENCOUNTER — Telehealth: Payer: Self-pay | Admitting: Internal Medicine

## 2017-06-26 NOTE — Telephone Encounter (Signed)
Pt called stating her feet and toes are swollen, she would like a call back

## 2017-06-26 NOTE — Telephone Encounter (Signed)
Called pt and scheduled her for Friday @ 1115

## 2017-06-28 ENCOUNTER — Other Ambulatory Visit (INDEPENDENT_AMBULATORY_CARE_PROVIDER_SITE_OTHER): Payer: Medicare HMO

## 2017-06-28 ENCOUNTER — Ambulatory Visit (INDEPENDENT_AMBULATORY_CARE_PROVIDER_SITE_OTHER): Payer: Medicare HMO | Admitting: Internal Medicine

## 2017-06-28 ENCOUNTER — Encounter: Payer: Self-pay | Admitting: Internal Medicine

## 2017-06-28 VITALS — BP 122/64 | HR 64 | Temp 98.5°F | Wt 184.0 lb

## 2017-06-28 DIAGNOSIS — E538 Deficiency of other specified B group vitamins: Secondary | ICD-10-CM | POA: Diagnosis not present

## 2017-06-28 DIAGNOSIS — I5031 Acute diastolic (congestive) heart failure: Secondary | ICD-10-CM | POA: Diagnosis not present

## 2017-06-28 DIAGNOSIS — M25561 Pain in right knee: Secondary | ICD-10-CM

## 2017-06-28 DIAGNOSIS — I10 Essential (primary) hypertension: Secondary | ICD-10-CM | POA: Diagnosis not present

## 2017-06-28 DIAGNOSIS — G8929 Other chronic pain: Secondary | ICD-10-CM | POA: Diagnosis not present

## 2017-06-28 DIAGNOSIS — M545 Low back pain: Secondary | ICD-10-CM

## 2017-06-28 DIAGNOSIS — D638 Anemia in other chronic diseases classified elsewhere: Secondary | ICD-10-CM | POA: Diagnosis not present

## 2017-06-28 DIAGNOSIS — M25562 Pain in left knee: Secondary | ICD-10-CM

## 2017-06-28 LAB — CBC WITH DIFFERENTIAL/PLATELET
BASOS ABS: 0 10*3/uL (ref 0.0–0.1)
Basophils Relative: 0.4 % (ref 0.0–3.0)
EOS ABS: 0.2 10*3/uL (ref 0.0–0.7)
Eosinophils Relative: 2.6 % (ref 0.0–5.0)
HEMATOCRIT: 30.3 % — AB (ref 36.0–46.0)
Hemoglobin: 9.8 g/dL — ABNORMAL LOW (ref 12.0–15.0)
Lymphocytes Relative: 16 % (ref 12.0–46.0)
Lymphs Abs: 1.1 10*3/uL (ref 0.7–4.0)
MCHC: 32.2 g/dL (ref 30.0–36.0)
MCV: 94.5 fl (ref 78.0–100.0)
MONOS PCT: 7.2 % (ref 3.0–12.0)
Monocytes Absolute: 0.5 10*3/uL (ref 0.1–1.0)
NEUTROS PCT: 73.8 % (ref 43.0–77.0)
Neutro Abs: 4.9 10*3/uL (ref 1.4–7.7)
Platelets: 205 10*3/uL (ref 150.0–400.0)
RBC: 3.21 Mil/uL — AB (ref 3.87–5.11)
RDW: 13.8 % (ref 11.5–15.5)
WBC: 6.6 10*3/uL (ref 4.0–10.5)

## 2017-06-28 LAB — HEPATIC FUNCTION PANEL
ALBUMIN: 3.7 g/dL (ref 3.5–5.2)
ALK PHOS: 60 U/L (ref 39–117)
ALT: 7 U/L (ref 0–35)
AST: 15 U/L (ref 0–37)
BILIRUBIN DIRECT: 0.1 mg/dL (ref 0.0–0.3)
Total Bilirubin: 0.4 mg/dL (ref 0.2–1.2)
Total Protein: 7 g/dL (ref 6.0–8.3)

## 2017-06-28 LAB — BASIC METABOLIC PANEL
BUN: 22 mg/dL (ref 6–23)
CHLORIDE: 108 meq/L (ref 96–112)
CO2: 27 mEq/L (ref 19–32)
Calcium: 9.3 mg/dL (ref 8.4–10.5)
Creatinine, Ser: 1.14 mg/dL (ref 0.40–1.20)
GFR: 58.66 mL/min — AB (ref >60.00)
Glucose, Bld: 103 mg/dL — ABNORMAL HIGH (ref 70–99)
Potassium: 5.2 mEq/L — ABNORMAL HIGH (ref 3.5–5.1)
SODIUM: 139 meq/L (ref 135–145)

## 2017-06-28 LAB — TSH: TSH: 1.44 u[IU]/mL (ref 0.35–4.50)

## 2017-06-28 LAB — IRON,TIBC AND FERRITIN PANEL
%SAT: 30 % (ref 11–50)
Ferritin: 157 ng/mL (ref 20–288)
Iron: 72 ug/dL (ref 45–160)
TIBC: 242 ug/dL — AB (ref 250–450)

## 2017-06-28 MED ORDER — METHYLPREDNISOLONE ACETATE 40 MG/ML IJ SUSP
40.0000 mg | Freq: Once | INTRAMUSCULAR | Status: AC
  Administered 2017-06-28: 40 mg via INTRAMUSCULAR

## 2017-06-28 MED ORDER — FUROSEMIDE 20 MG PO TABS: 20.0000 mg | ORAL_TABLET | Freq: Every day | ORAL | 3 refills | Status: DC | PRN

## 2017-06-28 NOTE — Assessment & Plan Note (Signed)
B knees were injected

## 2017-06-28 NOTE — Progress Notes (Signed)
Subjective:  Patient ID: [name], female    DOB: October 22, 1933  Age: 83 y.o. MRN: [id number]  CC: No chief complaint on file.   HPI [name] presents for OA, FMS, B12 def C/o LE weakness and LBP - worse  Outpatient Medications Prior to Visit  Medication Sig Dispense Refill  . Alcohol Swabs (B-D SINGLE USE SWABS REGULAR) PADS Use to clean area to check blood sugars twice a day 300 each 1  . aspirin EC 325 MG tablet Take 325 mg by mouth daily.    . Blood Glucose Calibration (ACCU-CHEK AVIVA) SOLN Use as directed 3 each 1  . carvedilol (COREG) 25 MG tablet Take 1 tablet (25 mg total) by mouth 2 (two) times daily with a meal. 180 tablet 3  . cholecalciferol (VITAMIN D) 1000 units tablet Take 1,000 Units by mouth daily.    . cloNIDine (CATAPRES) 0.1 MG tablet Take 1 tablet (0.1 mg total) by mouth 2 (two) times daily. 180 tablet 3  . glucose blood (ONETOUCH VERIO) test strip Use as instructed 50 each 11  . levETIRAcetam (KEPPRA) 500 MG tablet Take 1 tablet (500 mg total) by mouth 2 (two) times daily. 180 tablet 3  . meclizine (ANTIVERT) 25 MG tablet Take 0.5-1 tablets (12.5-25 mg total) by mouth 3 (three) times daily as needed for dizziness. 180 tablet 0  . ONETOUCH DELICA LANCETS FINE MISC 1 Device by Does not apply route daily as needed. 100 each 3  . oxybutynin (DITROPAN) 5 MG tablet Take 1 tablet (5 mg total) by mouth 2 (two) times daily. 180 tablet 3  . predniSONE (DELTASONE) 5 MG tablet Take 1 tablet (5 mg total) by mouth daily with breakfast. 90 tablet 1  . vitamin B-12 (CYANOCOBALAMIN) 1000 MCG tablet Take 1,000 mcg by mouth daily.     No facility-administered medications prior to visit.     ROS Review of Systems  Constitutional: Positive for fatigue. Negative for activity change, appetite change, chills and unexpected weight change.  HENT: Negative for congestion, mouth sores and sinus pressure.   Eyes: Negative for visual disturbance.  Respiratory: Negative for  cough and chest tightness.   Gastrointestinal: Negative for abdominal pain and nausea.  Genitourinary: Negative for difficulty urinating, frequency and vaginal pain.  Musculoskeletal: Positive for arthralgias, back pain and gait problem.  Skin: Negative for pallor and rash.  Neurological: Negative for dizziness, tremors, weakness, numbness and headaches.  Psychiatric/Behavioral: Negative for confusion, sleep disturbance and suicidal ideas.    Objective:  BP 122/64   Pulse 64   Temp 98.5 F (36.9 C)   Wt 184 lb (83.5 kg)   SpO2 100%   BMI 30.62 kg/m   BP Readings from Last 3 Encounters:  06/28/17 122/64  04/23/17 138/72  12/11/16 (!) 162/80    Wt Readings from Last 3 Encounters:  06/28/17 184 lb (83.5 kg)  04/23/17 178 lb (80.7 kg)  12/11/16 157 lb (71.2 kg)    Physical Exam  Constitutional: She appears well-developed. No distress.  HENT:  Head: Normocephalic.  Right Ear: External ear normal.  Left Ear: External ear normal.  Nose: Nose normal.  Mouth/Throat: Oropharynx is clear and moist.  Eyes: Pupils are equal, round, and reactive to light. Conjunctivae are normal. Right eye exhibits no discharge. Left eye exhibits no discharge.  Neck: Normal range of motion. Neck supple. No JVD present. No tracheal deviation present. No thyromegaly present.  Cardiovascular: Normal rate, regular rhythm and normal heart sounds.  Pulmonary/Chest: No stridor. No respiratory distress. She has no wheezes.  Abdominal: Soft. Bowel sounds are normal. She exhibits no distension and no mass. There is no tenderness. There is no rebound and no guarding.  Musculoskeletal: She exhibits tenderness. She exhibits no edema.  Lymphadenopathy:    She has no cervical adenopathy.  Neurological: She displays normal reflexes. No cranial nerve deficit. She exhibits normal muscle tone. Coordination abnormal.  Skin: No rash noted. No erythema.  Psychiatric: She has a normal mood and affect. Her behavior is  normal. Judgment and thought content normal.  achy w/ROM Cane Trace edema - ankles B knees are tender    Procedure Note :     Procedure : Joint Injection,  R and L knee   Indication:  Joint osteoarthritis with refractory  B chronic pain.   Risks including unsuccessful procedure , bleeding, infection, bruising, skin atrophy, "steroid flare-up" and others were explained to the patient in detail as well as the benefits. Informed consent was obtained and signed.   Tthe patient was placed in a comfortable position. Lateral approach was used B. Skin was prepped with Betadine and alcohol  and anesthetized a cooling spray. Then, a 5 cc syringe with a 1.5 inch long 25-gauge needle was used for a joint injection. The needle was advanced  Into the knee joint cavity. I aspirated a small amount of intra-articular fluid to confirm correct placement of the needle and injected the joint with 5 mL of 2% lidocaine and 40 mg of Depo-Medrol each.  Band-Aid was applied B.   Tolerated well. Complications: None. Good pain relief following the procedure.      Lab Results  Component Value Date   WBC 5.7 10/27/2016   HGB 9.0 (L) 10/27/2016   HCT 27.4 (L) 10/27/2016   PLT 230 10/27/2016   GLUCOSE 95 04/23/2017   CHOL 169 06/25/2015   TRIG 61 06/25/2015   HDL 57 06/25/2015   LDLDIRECT 114.8 01/10/2011   LDLCALC 100 (H) 06/25/2015   ALT 8 12/11/2016   AST 14 12/11/2016   NA 137 04/23/2017   K 4.8 04/23/2017   CL 105 04/23/2017   CREATININE 1.13 04/23/2017   BUN 32 (H) 04/23/2017   CO2 27 04/23/2017   TSH 1.59 08/12/2015   INR 1.09 07/17/2016   HGBA1C 7.3 (H) 04/23/2017    Dg Abdomen Acute W/chest  Result Date: 10/27/2016 CLINICAL DATA:  Abdominal pain. EXAM: DG ABDOMEN ACUTE W/ 1V CHEST COMPARISON:  Chest x-ray 07/17/2016 FINDINGS: Nonobstructive bowel gas pattern. Large stool burden throughout the colon. Prior cholecystectomy. No organomegaly or free air. Heart is borderline in size. No  confluent airspace opacity or effusion. No acute bony abnormality. IMPRESSION: Large stool burden.  No obstruction or free air. No acute cardiopulmonary disease. Electronically Signed   By: Charlett Nose M.D.   On: 10/27/2016 16:15    Assessment & Plan:   There are no diagnoses linked to this encounter. I am having Ms. Goyne maintain her vitamin B-12, aspirin EC, cholecalciferol, carvedilol, levETIRAcetam, oxybutynin, predniSONE, glucose blood, ONETOUCH DELICA LANCETS FINE, meclizine, ACCU-CHEK AVIVA, B-D SINGLE USE SWABS REGULAR, and cloNIDine.  No orders of the defined types were placed in this encounter.    Follow-up: No Follow-up on file.  [name], MD

## 2017-06-28 NOTE — Assessment & Plan Note (Signed)
Coreg

## 2017-06-28 NOTE — Assessment & Plan Note (Signed)
On B12 

## 2017-06-28 NOTE — Assessment & Plan Note (Signed)
Coreg, Catapress po

## 2017-06-28 NOTE — Addendum Note (Signed)
Addended by: Radford Pax M on: 06/28/2017 01:09 PM   Modules accepted: Orders

## 2017-07-01 ENCOUNTER — Telehealth: Payer: Self-pay | Admitting: Internal Medicine

## 2017-07-01 MED ORDER — FUROSEMIDE 20 MG PO TABS: 20.0000 mg | ORAL_TABLET | Freq: Every day | ORAL | 0 refills | Status: DC | PRN

## 2017-07-01 NOTE — Telephone Encounter (Signed)
Patient has called back in.  States she never spoke with anyone at [facility]. She was calling to only check on status on script.  I did inform patient that script was sent.  She is also requesting a small script to be sent to [place] at [facility].

## 2017-07-01 NOTE — Telephone Encounter (Signed)
Patient also states that her feet look like donuts this morning and have black spots on them.  I have sent patient to [facility] for triage.

## 2017-07-01 NOTE — Telephone Encounter (Signed)
Patient states [name] is needing a "number" before they can send her lasix to her.  Can you please follow up on this a call patient back?

## 2017-07-01 NOTE — Addendum Note (Signed)
Addended by: Deatra James on: 07/01/2017 12:11 PM   Modules accepted: Orders

## 2017-07-01 NOTE — Telephone Encounter (Signed)
Called [employer] to verify pt msg below. MD sent rx for #30 wanting to know if its ok to change to 90 day. Inform rep ok to change sending new rx as we speak electronically...Raechel Chute

## 2017-07-01 NOTE — Telephone Encounter (Signed)
Sent short term supply to [facility]....[name]

## 2017-07-05 DIAGNOSIS — M25561 Pain in right knee: Secondary | ICD-10-CM | POA: Diagnosis not present

## 2017-07-05 DIAGNOSIS — I110 Hypertensive heart disease with heart failure: Secondary | ICD-10-CM | POA: Diagnosis not present

## 2017-07-05 DIAGNOSIS — M25562 Pain in left knee: Secondary | ICD-10-CM | POA: Diagnosis not present

## 2017-07-05 DIAGNOSIS — G8929 Other chronic pain: Secondary | ICD-10-CM | POA: Diagnosis not present

## 2017-07-05 DIAGNOSIS — I5031 Acute diastolic (congestive) heart failure: Secondary | ICD-10-CM | POA: Diagnosis not present

## 2017-07-05 DIAGNOSIS — M545 Low back pain: Secondary | ICD-10-CM | POA: Diagnosis not present

## 2017-07-09 DIAGNOSIS — M25561 Pain in right knee: Secondary | ICD-10-CM | POA: Diagnosis not present

## 2017-07-09 DIAGNOSIS — I5031 Acute diastolic (congestive) heart failure: Secondary | ICD-10-CM | POA: Diagnosis not present

## 2017-07-09 DIAGNOSIS — G8929 Other chronic pain: Secondary | ICD-10-CM | POA: Diagnosis not present

## 2017-07-09 DIAGNOSIS — M545 Low back pain: Secondary | ICD-10-CM | POA: Diagnosis not present

## 2017-07-09 DIAGNOSIS — I110 Hypertensive heart disease with heart failure: Secondary | ICD-10-CM | POA: Diagnosis not present

## 2017-07-09 DIAGNOSIS — M25562 Pain in left knee: Secondary | ICD-10-CM | POA: Diagnosis not present

## 2017-07-11 ENCOUNTER — Telehealth: Payer: Self-pay | Admitting: Internal Medicine

## 2017-07-11 DIAGNOSIS — M545 Low back pain: Secondary | ICD-10-CM | POA: Diagnosis not present

## 2017-07-11 DIAGNOSIS — M25562 Pain in left knee: Secondary | ICD-10-CM | POA: Diagnosis not present

## 2017-07-11 DIAGNOSIS — M25561 Pain in right knee: Secondary | ICD-10-CM | POA: Diagnosis not present

## 2017-07-11 DIAGNOSIS — I110 Hypertensive heart disease with heart failure: Secondary | ICD-10-CM | POA: Diagnosis not present

## 2017-07-11 DIAGNOSIS — I5031 Acute diastolic (congestive) heart failure: Secondary | ICD-10-CM | POA: Diagnosis not present

## 2017-07-11 DIAGNOSIS — G8929 Other chronic pain: Secondary | ICD-10-CM | POA: Diagnosis not present

## 2017-07-11 NOTE — Telephone Encounter (Signed)
Urvi (PT from [facility]) called stating that she is out with the pt today for Physical Therapy. She said that her weight has increased from 182 (Tuesday) to 187 (today) and her blood pressure was 150/80. She was wanting to know what they needed to do? The pt told her that she took her fluid pill yesterday but did not take it today because she does not want to go to the bathroom that frequently. She does not have enough Depends to get her through the day if she were to take it. Please advise.

## 2017-07-11 NOTE — Telephone Encounter (Signed)
Pls advise since MD is out of office../lmb 

## 2017-07-16 ENCOUNTER — Ambulatory Visit: Payer: Medicare HMO | Admitting: Internal Medicine

## 2017-07-16 DIAGNOSIS — M25561 Pain in right knee: Secondary | ICD-10-CM | POA: Diagnosis not present

## 2017-07-16 DIAGNOSIS — M545 Low back pain: Secondary | ICD-10-CM | POA: Diagnosis not present

## 2017-07-16 DIAGNOSIS — I5031 Acute diastolic (congestive) heart failure: Secondary | ICD-10-CM | POA: Diagnosis not present

## 2017-07-16 DIAGNOSIS — G8929 Other chronic pain: Secondary | ICD-10-CM | POA: Diagnosis not present

## 2017-07-16 DIAGNOSIS — M25562 Pain in left knee: Secondary | ICD-10-CM | POA: Diagnosis not present

## 2017-07-16 DIAGNOSIS — I110 Hypertensive heart disease with heart failure: Secondary | ICD-10-CM | POA: Diagnosis not present

## 2017-07-16 NOTE — Telephone Encounter (Signed)
Called pt no answer LMOM w/MD response../lmb 

## 2017-07-16 NOTE — Telephone Encounter (Signed)
NAS diet Pls take meds as prescribed Thx

## 2017-07-17 ENCOUNTER — Telehealth: Payer: Self-pay | Admitting: Internal Medicine

## 2017-07-17 DIAGNOSIS — I110 Hypertensive heart disease with heart failure: Secondary | ICD-10-CM | POA: Diagnosis not present

## 2017-07-17 DIAGNOSIS — M25561 Pain in right knee: Secondary | ICD-10-CM | POA: Diagnosis not present

## 2017-07-17 DIAGNOSIS — I5031 Acute diastolic (congestive) heart failure: Secondary | ICD-10-CM | POA: Diagnosis not present

## 2017-07-17 DIAGNOSIS — M25562 Pain in left knee: Secondary | ICD-10-CM | POA: Diagnosis not present

## 2017-07-17 DIAGNOSIS — G8929 Other chronic pain: Secondary | ICD-10-CM | POA: Diagnosis not present

## 2017-07-17 DIAGNOSIS — M545 Low back pain: Secondary | ICD-10-CM | POA: Diagnosis not present

## 2017-07-17 NOTE — Telephone Encounter (Signed)
Don from [facility] called requesting verbal orders for occupational therapy 1 time a week for one week and 2 times a week for one week for ADL transfers and an okay to discharge when goal is met or at maximum potential.  ° °

## 2017-07-17 NOTE — Telephone Encounter (Signed)
Verbals given, FYI 

## 2017-07-19 ENCOUNTER — Telehealth: Payer: Self-pay | Admitting: Internal Medicine

## 2017-07-19 DIAGNOSIS — M545 Low back pain: Secondary | ICD-10-CM | POA: Diagnosis not present

## 2017-07-19 DIAGNOSIS — M25562 Pain in left knee: Secondary | ICD-10-CM | POA: Diagnosis not present

## 2017-07-19 DIAGNOSIS — M25561 Pain in right knee: Secondary | ICD-10-CM | POA: Diagnosis not present

## 2017-07-19 DIAGNOSIS — I110 Hypertensive heart disease with heart failure: Secondary | ICD-10-CM | POA: Diagnosis not present

## 2017-07-19 DIAGNOSIS — G8929 Other chronic pain: Secondary | ICD-10-CM | POA: Diagnosis not present

## 2017-07-19 DIAGNOSIS — I5031 Acute diastolic (congestive) heart failure: Secondary | ICD-10-CM | POA: Diagnosis not present

## 2017-07-19 NOTE — Telephone Encounter (Signed)
Ruby from [facility] called stating that the pt has been very tired and exhausted. She said that she has also lost weight. She reported that on 8/24 she weighed 183, on 8/30 she was 187 and today she was 176.

## 2017-07-21 NOTE — Telephone Encounter (Signed)
Noted. Thx.

## 2017-07-23 DIAGNOSIS — G8929 Other chronic pain: Secondary | ICD-10-CM | POA: Diagnosis not present

## 2017-07-23 DIAGNOSIS — M25562 Pain in left knee: Secondary | ICD-10-CM | POA: Diagnosis not present

## 2017-07-23 DIAGNOSIS — M545 Low back pain: Secondary | ICD-10-CM | POA: Diagnosis not present

## 2017-07-23 DIAGNOSIS — M25561 Pain in right knee: Secondary | ICD-10-CM | POA: Diagnosis not present

## 2017-07-23 DIAGNOSIS — I110 Hypertensive heart disease with heart failure: Secondary | ICD-10-CM | POA: Diagnosis not present

## 2017-07-23 DIAGNOSIS — I5031 Acute diastolic (congestive) heart failure: Secondary | ICD-10-CM | POA: Diagnosis not present

## 2017-07-23 NOTE — Telephone Encounter (Signed)
[name] called and states the patient is very lethargic and is very tired. She would ike to know if any medication needs to be changed.

## 2017-07-23 NOTE — Telephone Encounter (Signed)
Take Clonidine at hs only; d/c am dose Oxybutynin - take at hs only Thx

## 2017-07-23 NOTE — Telephone Encounter (Signed)
Please advise about the message below, Ruby called back again stating it is very had for the patient to do anything at home and would like to know what they need to do.

## 2017-07-25 ENCOUNTER — Telehealth: Payer: Self-pay | Admitting: Internal Medicine

## 2017-07-25 DIAGNOSIS — I5031 Acute diastolic (congestive) heart failure: Secondary | ICD-10-CM | POA: Diagnosis not present

## 2017-07-25 DIAGNOSIS — G8929 Other chronic pain: Secondary | ICD-10-CM | POA: Diagnosis not present

## 2017-07-25 DIAGNOSIS — I110 Hypertensive heart disease with heart failure: Secondary | ICD-10-CM | POA: Diagnosis not present

## 2017-07-25 DIAGNOSIS — M25562 Pain in left knee: Secondary | ICD-10-CM | POA: Diagnosis not present

## 2017-07-25 DIAGNOSIS — M25561 Pain in right knee: Secondary | ICD-10-CM | POA: Diagnosis not present

## 2017-07-25 DIAGNOSIS — M545 Low back pain: Secondary | ICD-10-CM | POA: Diagnosis not present

## 2017-07-25 MED ORDER — LEVETIRACETAM 500 MG PO TABS
500.0000 mg | ORAL_TABLET | Freq: Two times a day (BID) | ORAL | 1 refills | Status: DC
Start: 2017-07-25 — End: 2017-08-27

## 2017-07-25 MED ORDER — PREDNISONE 5 MG PO TABS: 5.0000 mg | ORAL_TABLET | Freq: Every day | ORAL | 1 refills | Status: DC

## 2017-07-25 NOTE — Telephone Encounter (Signed)
[name] from [facility] called stating that she was going to go out to visit the pt for a skilled nursing assessment but the pt did not want her to come today. She said that she had a lot going on and would like to wait until next week.

## 2017-07-25 NOTE — Telephone Encounter (Signed)
Called stating the patient BP today is 176/80, patient is having a lot of pain in her knee caps. She has had no energy for several weeks, claims since she had an injection on 8/17,  She also has had some weight loss she is 176 today.  Please call back in regard , home health nurse will be at house later today.

## 2017-07-25 NOTE — Telephone Encounter (Signed)
Restart both Renew both Rx's x 6 mo Thx

## 2017-07-25 NOTE — Telephone Encounter (Signed)
Don from [facility] called stating that the pt is out of prednizone 5mg  and levetiracetam 500mg . He and the pt did not know if she should be continuing the medication or not. He would like a call back to let him know.

## 2017-07-25 NOTE — Telephone Encounter (Signed)
RX sent

## 2017-07-25 NOTE — Telephone Encounter (Signed)
[name], pt, and daughter notified

## 2017-07-25 NOTE — Telephone Encounter (Signed)
[name], pt, and Daughter notified

## 2017-07-25 NOTE — Telephone Encounter (Signed)
Morphies, Carson J 33 minutes ago (12:40 PM)      Roe Coombs from [facility] called stating that the pt is out of prednizone 5mg  and levetiracetam 500mg . He and the pt did not know if she should be continuing the medication or not. He would like a call back to let him know.        Documentation    Please advise about both messages

## 2017-07-25 NOTE — Telephone Encounter (Signed)
Added to other TE.

## 2017-07-26 DIAGNOSIS — M25562 Pain in left knee: Secondary | ICD-10-CM | POA: Diagnosis not present

## 2017-07-26 DIAGNOSIS — G8929 Other chronic pain: Secondary | ICD-10-CM | POA: Diagnosis not present

## 2017-07-26 DIAGNOSIS — M545 Low back pain: Secondary | ICD-10-CM | POA: Diagnosis not present

## 2017-07-26 DIAGNOSIS — I5031 Acute diastolic (congestive) heart failure: Secondary | ICD-10-CM | POA: Diagnosis not present

## 2017-07-26 DIAGNOSIS — I110 Hypertensive heart disease with heart failure: Secondary | ICD-10-CM | POA: Diagnosis not present

## 2017-07-26 DIAGNOSIS — M25561 Pain in right knee: Secondary | ICD-10-CM | POA: Diagnosis not present

## 2017-07-26 NOTE — Telephone Encounter (Signed)
Noted. Thx.

## 2017-07-30 ENCOUNTER — Telehealth: Payer: Self-pay | Admitting: Internal Medicine

## 2017-07-30 DIAGNOSIS — M25561 Pain in right knee: Secondary | ICD-10-CM | POA: Diagnosis not present

## 2017-07-30 DIAGNOSIS — I5031 Acute diastolic (congestive) heart failure: Secondary | ICD-10-CM | POA: Diagnosis not present

## 2017-07-30 DIAGNOSIS — I110 Hypertensive heart disease with heart failure: Secondary | ICD-10-CM | POA: Diagnosis not present

## 2017-07-30 DIAGNOSIS — M25562 Pain in left knee: Secondary | ICD-10-CM | POA: Diagnosis not present

## 2017-07-30 DIAGNOSIS — M545 Low back pain: Secondary | ICD-10-CM | POA: Diagnosis not present

## 2017-07-30 DIAGNOSIS — G8929 Other chronic pain: Secondary | ICD-10-CM | POA: Diagnosis not present

## 2017-07-30 NOTE — Telephone Encounter (Signed)
[name] from [facility] called wanting to verify some of the patients medications.  Clonidine - bottle says BID and the family says QD. I verified through past phone notes that this medication was changed to only 1 am dose (as of 07/23/17 by Dr Posey Rea).  Keppra - bottle says BID and family says QD. I looked through the phone notes and saw that this was recently restarted and renewed as 1 BID.  Just FYI.

## 2017-07-31 NOTE — Telephone Encounter (Signed)
FYI

## 2017-08-01 ENCOUNTER — Telehealth: Payer: Self-pay | Admitting: Internal Medicine

## 2017-08-01 DIAGNOSIS — M545 Low back pain: Secondary | ICD-10-CM | POA: Diagnosis not present

## 2017-08-01 DIAGNOSIS — M25561 Pain in right knee: Secondary | ICD-10-CM | POA: Diagnosis not present

## 2017-08-01 DIAGNOSIS — G8929 Other chronic pain: Secondary | ICD-10-CM | POA: Diagnosis not present

## 2017-08-01 DIAGNOSIS — I5031 Acute diastolic (congestive) heart failure: Secondary | ICD-10-CM | POA: Diagnosis not present

## 2017-08-01 DIAGNOSIS — I110 Hypertensive heart disease with heart failure: Secondary | ICD-10-CM | POA: Diagnosis not present

## 2017-08-01 DIAGNOSIS — M25562 Pain in left knee: Secondary | ICD-10-CM | POA: Diagnosis not present

## 2017-08-01 NOTE — Telephone Encounter (Signed)
Verbal Order for PT:  1 x a week 2 weeks  Starting Oct 1st  [id number] - Dutch Quint

## 2017-08-01 NOTE — Telephone Encounter (Signed)
Sree contacted and given verbal order

## 2017-08-05 ENCOUNTER — Ambulatory Visit (INDEPENDENT_AMBULATORY_CARE_PROVIDER_SITE_OTHER): Payer: Medicare HMO | Admitting: Internal Medicine

## 2017-08-05 ENCOUNTER — Encounter: Payer: Self-pay | Admitting: Internal Medicine

## 2017-08-05 DIAGNOSIS — R634 Abnormal weight loss: Secondary | ICD-10-CM | POA: Diagnosis not present

## 2017-08-05 DIAGNOSIS — I2510 Atherosclerotic heart disease of native coronary artery without angina pectoris: Secondary | ICD-10-CM

## 2017-08-05 DIAGNOSIS — M25561 Pain in right knee: Secondary | ICD-10-CM | POA: Diagnosis not present

## 2017-08-05 DIAGNOSIS — E1121 Type 2 diabetes mellitus with diabetic nephropathy: Secondary | ICD-10-CM | POA: Diagnosis not present

## 2017-08-05 DIAGNOSIS — M545 Low back pain, unspecified: Secondary | ICD-10-CM

## 2017-08-05 DIAGNOSIS — Z23 Encounter for immunization: Secondary | ICD-10-CM | POA: Diagnosis not present

## 2017-08-05 DIAGNOSIS — G8929 Other chronic pain: Secondary | ICD-10-CM | POA: Diagnosis not present

## 2017-08-05 DIAGNOSIS — E538 Deficiency of other specified B group vitamins: Secondary | ICD-10-CM

## 2017-08-05 DIAGNOSIS — M25562 Pain in left knee: Secondary | ICD-10-CM | POA: Diagnosis not present

## 2017-08-05 DIAGNOSIS — F4323 Adjustment disorder with mixed anxiety and depressed mood: Secondary | ICD-10-CM | POA: Diagnosis not present

## 2017-08-05 DIAGNOSIS — R5382 Chronic fatigue, unspecified: Secondary | ICD-10-CM

## 2017-08-05 DIAGNOSIS — I110 Hypertensive heart disease with heart failure: Secondary | ICD-10-CM | POA: Diagnosis not present

## 2017-08-05 DIAGNOSIS — N179 Acute kidney failure, unspecified: Secondary | ICD-10-CM

## 2017-08-05 DIAGNOSIS — I5031 Acute diastolic (congestive) heart failure: Secondary | ICD-10-CM | POA: Diagnosis not present

## 2017-08-05 NOTE — Assessment & Plan Note (Signed)
On ASA 

## 2017-08-05 NOTE — Assessment & Plan Note (Signed)
On B12 
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
## 2017-08-05 NOTE — Assessment & Plan Note (Signed)
  On diet  

## 2017-08-05 NOTE — Assessment & Plan Note (Signed)
Labs

## 2017-08-05 NOTE — Assessment & Plan Note (Signed)
?  spinal stenosis 

## 2017-08-05 NOTE — Patient Instructions (Signed)
Please stop Clonidine and Ditropan

## 2017-08-05 NOTE — Progress Notes (Signed)
Subjective:  Patient ID: [name], female    DOB: 14-Mar-1933  Age: 83 y.o. MRN: [id number]  CC: No chief complaint on file.   HPI [name] presents for being very weak in the legs; fatigued It is very hard to move: severe pain in the R knee, LS spine, weak  Outpatient Medications Prior to Visit  Medication Sig Dispense Refill  . Alcohol Swabs (B-D SINGLE USE SWABS REGULAR) PADS Use to clean area to check blood sugars twice a day 300 each 1  . aspirin EC 325 MG tablet Take 325 mg by mouth daily.    . Blood Glucose Calibration (ACCU-CHEK AVIVA) SOLN Use as directed 3 each 1  . carvedilol (COREG) 25 MG tablet Take 1 tablet (25 mg total) by mouth 2 (two) times daily with a meal. 180 tablet 3  . cholecalciferol (VITAMIN D) 1000 units tablet Take 1,000 Units by mouth daily.    . cloNIDine (CATAPRES) 0.1 MG tablet Take 1 tablet (0.1 mg total) by mouth 2 (two) times daily. 180 tablet 3  . furosemide (LASIX) 20 MG tablet Take 1 tablet (20 mg total) by mouth daily as needed. 10 tablet 0  . glucose blood (ONETOUCH VERIO) test strip Use as instructed 50 each 11  . levETIRAcetam (KEPPRA) 500 MG tablet Take 1 tablet (500 mg total) by mouth 2 (two) times daily. 180 tablet 1  . meclizine (ANTIVERT) 25 MG tablet Take 0.5-1 tablets (12.5-25 mg total) by mouth 3 (three) times daily as needed for dizziness. 180 tablet 0  . ONETOUCH DELICA LANCETS FINE MISC 1 Device by Does not apply route daily as needed. 100 each 3  . oxybutynin (DITROPAN) 5 MG tablet Take 1 tablet (5 mg total) by mouth 2 (two) times daily. 180 tablet 3  . predniSONE (DELTASONE) 5 MG tablet Take 1 tablet (5 mg total) by mouth daily with breakfast. 90 tablet 1  . vitamin B-12 (CYANOCOBALAMIN) 1000 MCG tablet Take 1,000 mcg by mouth daily.     No facility-administered medications prior to visit.     ROS Review of Systems  Constitutional: Positive for fatigue. Negative for activity change, appetite change, chills and  unexpected weight change.  HENT: Negative for congestion, mouth sores and sinus pressure.   Eyes: Negative for visual disturbance.  Respiratory: Negative for cough and chest tightness.   Gastrointestinal: Negative for abdominal pain and nausea.  Genitourinary: Negative for difficulty urinating, frequency and vaginal pain.  Musculoskeletal: Positive for arthralgias, back pain, gait problem and myalgias.  Skin: Negative for pallor and rash.  Neurological: Positive for weakness. Negative for dizziness, tremors, numbness and headaches.  Psychiatric/Behavioral: Negative for confusion and sleep disturbance.    Objective:  There were no vitals taken for this visit.  BP Readings from Last 3 Encounters:  06/28/17 122/64  04/23/17 138/72  12/11/16 (!) 162/80    Wt Readings from Last 3 Encounters:  06/28/17 184 lb (83.5 kg)  04/23/17 178 lb (80.7 kg)  12/11/16 157 lb (71.2 kg)    Physical Exam  Constitutional: She appears well-developed. No distress.  HENT:  Head: Normocephalic.  Right Ear: External ear normal.  Left Ear: External ear normal.  Nose: Nose normal.  Mouth/Throat: Oropharynx is clear and moist.  Eyes: Pupils are equal, round, and reactive to light. Conjunctivae are normal. Right eye exhibits no discharge. Left eye exhibits no discharge.  Neck: Normal range of motion. Neck supple. No JVD present. No tracheal deviation present. No thyromegaly present.  Cardiovascular: Normal rate, regular rhythm and normal heart sounds.   Pulmonary/Chest: No stridor. No respiratory distress. She has no wheezes.  Abdominal: Soft. Bowel sounds are normal. She exhibits no distension and no mass. There is no tenderness. There is no rebound and no guarding.  Musculoskeletal: She exhibits tenderness. She exhibits no edema.  Lymphadenopathy:    She has no cervical adenopathy.  Neurological: She displays abnormal reflex. No cranial nerve deficit. She exhibits normal muscle tone. Coordination  abnormal.  Skin: No rash noted. No erythema.  Psychiatric: She has a normal mood and affect. Her behavior is normal. Judgment and thought content normal.  Cane Weak prox legs  Lab Results  Component Value Date   WBC 6.6 06/28/2017   HGB 9.8 (L) 06/28/2017   HCT 30.3 (L) 06/28/2017   PLT 205.0 06/28/2017   GLUCOSE 103 (H) 06/28/2017   CHOL 169 06/25/2015   TRIG 61 06/25/2015   HDL 57 06/25/2015   LDLDIRECT 114.8 01/10/2011   LDLCALC 100 (H) 06/25/2015   ALT 7 06/28/2017   AST 15 06/28/2017   NA 139 06/28/2017   K 5.2 (H) 06/28/2017   CL 108 06/28/2017   CREATININE 1.14 06/28/2017   BUN 22 06/28/2017   CO2 27 06/28/2017   TSH 1.44 06/28/2017   INR 1.09 07/17/2016   HGBA1C 7.3 (H) 04/23/2017    Dg Abdomen Acute W/chest  Result Date: 10/27/2016 CLINICAL DATA:  Abdominal pain. EXAM: DG ABDOMEN ACUTE W/ 1V CHEST COMPARISON:  Chest x-ray 07/17/2016 FINDINGS: Nonobstructive bowel gas pattern. Large stool burden throughout the colon. Prior cholecystectomy. No organomegaly or free air. Heart is borderline in size. No confluent airspace opacity or effusion. No acute bony abnormality. IMPRESSION: Large stool burden.  No obstruction or free air. No acute cardiopulmonary disease. Electronically Signed   By: Charlett Nose M.D.   On: 10/27/2016 16:15    Assessment & Plan:   There are no diagnoses linked to this encounter. I am having [name] maintain her vitamin B-12, aspirin EC, cholecalciferol, carvedilol, oxybutynin, glucose blood, ONETOUCH DELICA LANCETS FINE, meclizine, ACCU-CHEK AVIVA, B-D SINGLE USE SWABS REGULAR, cloNIDine, furosemide, levETIRAcetam, and predniSONE.  No orders of the defined types were placed in this encounter.    Follow-up: No Follow-up on file.  [name], MD

## 2017-08-05 NOTE — Assessment & Plan Note (Addendum)
CFS discussed Chronic, multifactorial. We will stop Clonidine and Ditropan. Neurol ref

## 2017-08-07 ENCOUNTER — Telehealth: Payer: Self-pay | Admitting: Internal Medicine

## 2017-08-07 DIAGNOSIS — Z7982 Long term (current) use of aspirin: Secondary | ICD-10-CM

## 2017-08-07 DIAGNOSIS — I110 Hypertensive heart disease with heart failure: Secondary | ICD-10-CM | POA: Diagnosis not present

## 2017-08-07 DIAGNOSIS — E1121 Type 2 diabetes mellitus with diabetic nephropathy: Secondary | ICD-10-CM | POA: Diagnosis not present

## 2017-08-07 DIAGNOSIS — M25561 Pain in right knee: Secondary | ICD-10-CM | POA: Diagnosis not present

## 2017-08-07 DIAGNOSIS — G8929 Other chronic pain: Secondary | ICD-10-CM | POA: Diagnosis not present

## 2017-08-07 DIAGNOSIS — F411 Generalized anxiety disorder: Secondary | ICD-10-CM | POA: Diagnosis not present

## 2017-08-07 DIAGNOSIS — Z7952 Long term (current) use of systemic steroids: Secondary | ICD-10-CM

## 2017-08-07 DIAGNOSIS — D638 Anemia in other chronic diseases classified elsewhere: Secondary | ICD-10-CM | POA: Diagnosis not present

## 2017-08-07 DIAGNOSIS — M25562 Pain in left knee: Secondary | ICD-10-CM | POA: Diagnosis not present

## 2017-08-07 DIAGNOSIS — M109 Gout, unspecified: Secondary | ICD-10-CM | POA: Diagnosis not present

## 2017-08-07 DIAGNOSIS — I5031 Acute diastolic (congestive) heart failure: Secondary | ICD-10-CM | POA: Diagnosis not present

## 2017-08-07 DIAGNOSIS — M545 Low back pain: Secondary | ICD-10-CM | POA: Diagnosis not present

## 2017-08-07 DIAGNOSIS — M1991 Primary osteoarthritis, unspecified site: Secondary | ICD-10-CM | POA: Diagnosis not present

## 2017-08-07 DIAGNOSIS — E538 Deficiency of other specified B group vitamins: Secondary | ICD-10-CM | POA: Diagnosis not present

## 2017-08-07 NOTE — Telephone Encounter (Signed)
Called [name] back inform her MD had discontinued pt ditropan & clonidine on 08/05/17...Raechel Chute

## 2017-08-07 NOTE — Telephone Encounter (Signed)
Would like a call back with list of medications plot has stopped to fill patients pill box.

## 2017-08-13 ENCOUNTER — Ambulatory Visit: Payer: Medicare HMO | Admitting: Internal Medicine

## 2017-08-16 DIAGNOSIS — M25562 Pain in left knee: Secondary | ICD-10-CM | POA: Diagnosis not present

## 2017-08-16 DIAGNOSIS — M25561 Pain in right knee: Secondary | ICD-10-CM | POA: Diagnosis not present

## 2017-08-16 DIAGNOSIS — I110 Hypertensive heart disease with heart failure: Secondary | ICD-10-CM | POA: Diagnosis not present

## 2017-08-16 DIAGNOSIS — M545 Low back pain: Secondary | ICD-10-CM | POA: Diagnosis not present

## 2017-08-16 DIAGNOSIS — I5031 Acute diastolic (congestive) heart failure: Secondary | ICD-10-CM | POA: Diagnosis not present

## 2017-08-16 DIAGNOSIS — G8929 Other chronic pain: Secondary | ICD-10-CM | POA: Diagnosis not present

## 2017-08-20 DIAGNOSIS — M545 Low back pain: Secondary | ICD-10-CM | POA: Diagnosis not present

## 2017-08-20 DIAGNOSIS — I110 Hypertensive heart disease with heart failure: Secondary | ICD-10-CM | POA: Diagnosis not present

## 2017-08-20 DIAGNOSIS — I5031 Acute diastolic (congestive) heart failure: Secondary | ICD-10-CM | POA: Diagnosis not present

## 2017-08-20 DIAGNOSIS — M25562 Pain in left knee: Secondary | ICD-10-CM | POA: Diagnosis not present

## 2017-08-20 DIAGNOSIS — M25561 Pain in right knee: Secondary | ICD-10-CM | POA: Diagnosis not present

## 2017-08-20 DIAGNOSIS — G8929 Other chronic pain: Secondary | ICD-10-CM | POA: Diagnosis not present

## 2017-08-23 ENCOUNTER — Other Ambulatory Visit: Payer: Self-pay | Admitting: Internal Medicine

## 2017-08-23 DIAGNOSIS — G8929 Other chronic pain: Secondary | ICD-10-CM | POA: Diagnosis not present

## 2017-08-23 DIAGNOSIS — M25562 Pain in left knee: Secondary | ICD-10-CM | POA: Diagnosis not present

## 2017-08-23 DIAGNOSIS — I5031 Acute diastolic (congestive) heart failure: Secondary | ICD-10-CM | POA: Diagnosis not present

## 2017-08-23 DIAGNOSIS — I110 Hypertensive heart disease with heart failure: Secondary | ICD-10-CM | POA: Diagnosis not present

## 2017-08-23 DIAGNOSIS — M545 Low back pain: Secondary | ICD-10-CM | POA: Diagnosis not present

## 2017-08-23 DIAGNOSIS — M25561 Pain in right knee: Secondary | ICD-10-CM | POA: Diagnosis not present

## 2017-08-23 DIAGNOSIS — R29898 Other symptoms and signs involving the musculoskeletal system: Secondary | ICD-10-CM

## 2017-08-26 ENCOUNTER — Other Ambulatory Visit: Payer: Self-pay | Admitting: *Deleted

## 2017-08-26 DIAGNOSIS — R29898 Other symptoms and signs involving the musculoskeletal system: Secondary | ICD-10-CM

## 2017-08-26 NOTE — Progress Notes (Unsigned)
emg 

## 2017-08-27 ENCOUNTER — Telehealth: Payer: Self-pay | Admitting: Internal Medicine

## 2017-08-27 DIAGNOSIS — M25562 Pain in left knee: Secondary | ICD-10-CM | POA: Diagnosis not present

## 2017-08-27 DIAGNOSIS — M545 Low back pain: Secondary | ICD-10-CM | POA: Diagnosis not present

## 2017-08-27 DIAGNOSIS — G8929 Other chronic pain: Secondary | ICD-10-CM | POA: Diagnosis not present

## 2017-08-27 DIAGNOSIS — I5031 Acute diastolic (congestive) heart failure: Secondary | ICD-10-CM | POA: Diagnosis not present

## 2017-08-27 DIAGNOSIS — M25561 Pain in right knee: Secondary | ICD-10-CM | POA: Diagnosis not present

## 2017-08-27 DIAGNOSIS — I110 Hypertensive heart disease with heart failure: Secondary | ICD-10-CM | POA: Diagnosis not present

## 2017-08-27 MED ORDER — FUROSEMIDE 20 MG PO TABS: 20.0000 mg | ORAL_TABLET | Freq: Every day | ORAL | 0 refills | Status: DC | PRN

## 2017-08-27 MED ORDER — GLUCOSE BLOOD VI STRP: ORAL_STRIP | 3 refills | Status: DC

## 2017-08-27 MED ORDER — CARVEDILOL 25 MG PO TABS: 25.0000 mg | ORAL_TABLET | Freq: Two times a day (BID) | ORAL | 1 refills | Status: DC

## 2017-08-27 MED ORDER — LEVETIRACETAM 500 MG PO TABS: 500.0000 mg | ORAL_TABLET | Freq: Two times a day (BID) | ORAL | 1 refills | Status: DC

## 2017-08-27 MED ORDER — PREDNISONE 5 MG PO TABS: 5.0000 mg | ORAL_TABLET | Freq: Every day | ORAL | 1 refills | Status: DC

## 2017-08-27 MED ORDER — BD SWAB SINGLE USE REGULAR PADS: MEDICATED_PAD | 1 refills | Status: DC

## 2017-08-27 NOTE — Telephone Encounter (Signed)
Patient states that she needs all her scripts sent to [facility].  States Francine Graven is out of scripts for her.  Requesting 90 day supply on all meds. Please follow up with patient in regard. Patient states she is out of meds.   Would like [facility] to put a rush on med delivery.

## 2017-08-27 NOTE — Telephone Encounter (Signed)
Reviewed chart pt is up-to-date sent all script to [facility]...Raechel Chute

## 2017-08-29 ENCOUNTER — Ambulatory Visit (INDEPENDENT_AMBULATORY_CARE_PROVIDER_SITE_OTHER): Payer: Medicare HMO | Admitting: Neurology

## 2017-08-29 ENCOUNTER — Ambulatory Visit: Payer: Medicare HMO | Admitting: Internal Medicine

## 2017-08-29 DIAGNOSIS — M5417 Radiculopathy, lumbosacral region: Secondary | ICD-10-CM

## 2017-08-29 DIAGNOSIS — R29898 Other symptoms and signs involving the musculoskeletal system: Secondary | ICD-10-CM | POA: Diagnosis not present

## 2017-08-29 DIAGNOSIS — G629 Polyneuropathy, unspecified: Secondary | ICD-10-CM

## 2017-08-29 NOTE — Procedures (Signed)
[facility]  [address] [place], [address]  [place], [place] [address] Tel: [phone] Fax:  [phone] Test Date:  08/29/2017  Patient: [name] DOB: 05/08/1933 Physician: Nita Sickle, DO  Sex: Female Height: 5\' 5"  Ref Phys: , MD  ID#: Zachery Dauer Temp: 32.0C Technician:    Patient Complaints: This is a 83 year old female referred for evaluation of generalized weakness of the lower extremities and low back pain.  NCV & EMG Findings: Extensive electrodiagnostic testing of the right lower extremity and additional studies of the left shows:  1. Bilateral sural and superficial peroneal sensory responses are absent. 2. Bilateral peroneal (EDB) and tibial motor responses are absent. Bilateral peroneal motor responses at the tibialis anterior showed reduced amplitude. 3. Bilateral tibial H reflex studies are absent. 4. Chronic motor axon loss changes are seen affecting the muscles below the knee as well as the gluteus medius muscle, without accompanied active denervation.   Impression: 1. The electrophysiologic findings are most consistent with a chronic sensorimotor polyneuropathy affecting the lower extremities, axon loss in type. Overall, these findings are severe in degree electrically. 2. A superimposed L5-S1 radiculopathy affecting bilateral lower extremities is also likely.   ___________________________ 97, DO    Nerve Conduction Studies Anti Sensory Summary Table   Site NR Peak (ms) Norm Peak (ms) P-T Amp (V) Norm P-T Amp  Left Sup Peroneal Anti Sensory (Ant Lat Mall)  32C  12 cm NR  <4.6  >3  Right Sup Peroneal Anti Sensory (Ant Lat Mall)  32C  12 cm NR  <4.6  >3  Left Sural Anti Sensory (Lat Mall)  32C  Calf NR  <4.6  >3  Right Sural Anti Sensory (Lat Mall)  32C  Calf NR  <4.6  >3   Motor Summary Table   Site NR Onset (ms) Norm Onset (ms) O-P Amp (mV) Norm O-P Amp Site1 Site2 Delta-0 (ms) Dist (cm) Vel (m/s) Norm Vel  (m/s)  Left Peroneal Motor (Ext Dig Brev)  32C  Ankle NR  <6.0  >2.5 B Fib Ankle  0.0  >40  B Fib NR     Poplt B Fib  0.0  >40  Poplt NR            Right Peroneal Motor (Ext Dig Brev)  32C  Ankle NR  <6.0  >2.5 B Fib Ankle  0.0  >40  B Fib NR     Poplt B Fib  0.0  >40  Poplt NR            Left Peroneal TA Motor (Tib Ant)  32C  Fib Head    3.2 <4.5 2.4 >3 Poplit Fib Head 1.0 8.0 80 >40  Poplit    4.2  2.0         Right Peroneal TA Motor (Tib Ant)  32C  Fib Head    4.5 <4.5 2.1 >3 Poplit Fib Head 1.0 8.0 80 >40  Poplit    5.5  2.2         Left Tibial Motor (Abd Hall Brev)  32C  Ankle NR  <6.0  >4 Knee Ankle  0.0  >40  Knee NR            Right Tibial Motor (Abd Hall Brev)  32C  Ankle NR  <6.0  >4 Knee Ankle  0.0  >40  Knee NR             H Reflex Studies   NR H-Lat (ms)  Lat Norm (ms) L-R H-Lat (ms)  Left Tibial (Gastroc)  32C  NR  <35   Right Tibial (Gastroc)  32C  NR  <35    EMG   Side Muscle Ins Act Fibs Psw Fasc Number Recrt Dur Dur. Amp Amp. Poly Poly. Comment  Right AntTibialis Nml Nml Nml Nml 1- Rapid Some 1+ Some 1+ Nml Nml N/A  Right Gastroc Nml Nml Nml Nml 1- Rapid Some 1+ Some 1+ Nml Nml N/A  Right RectFemoris Nml Nml Nml Nml Nml Nml Nml Nml Nml Nml Nml Nml N/A  Right GluteusMed Nml Nml Nml Nml 1- Rapid Some 1+ Some 1+ Nml Nml N/A  Right BicepsFemS Nml Nml Nml Nml 1- Rapid Some 1+ Some 1+ Nml Nml N/A  Left Flex Dig Long Nml Nml Nml Nml 2- Rapid Some 1+ Some 1+ Nml Nml N/A  Left Gastroc Nml Nml Nml Nml 1- Rapid Some 1+ Some 1+ Nml Nml N/A  Left AntTibialis Nml Nml Nml Nml 1- Rapid Some 1+ Some 1+ Nml Nml N/A  Left RectFemoris Nml Nml Nml Nml Nml Nml Nml Nml Nml Nml Nml Nml N/A      Waveforms:

## 2017-09-16 ENCOUNTER — Telehealth: Payer: Self-pay | Admitting: Internal Medicine

## 2017-09-16 NOTE — Telephone Encounter (Signed)
[place] and husband is [place].  States she contacted the post office and was told that if Dr. Posey Rea writes her a letter she can get her mail delivered to her door.

## 2017-09-30 NOTE — Telephone Encounter (Signed)
Pt called again regarding this, states they need a letter, she has arthritis and her husband is blind, she would like a letter ASAP

## 2017-10-01 NOTE — Telephone Encounter (Signed)
Pt notified letter ready 

## 2017-10-31 ENCOUNTER — Ambulatory Visit: Payer: Medicare HMO | Admitting: Internal Medicine

## 2017-11-04 ENCOUNTER — Telehealth: Payer: Self-pay | Admitting: Internal Medicine

## 2017-11-04 NOTE — Telephone Encounter (Signed)
[name] daughter, [name], called requesting a list of her mother's allergies; list given and [name] verifies understanding.

## 2017-11-09 ENCOUNTER — Ambulatory Visit: Payer: Self-pay | Admitting: Physician Assistant

## 2017-11-09 ENCOUNTER — Ambulatory Visit: Payer: Medicare HMO | Admitting: Nurse Practitioner

## 2017-11-13 ENCOUNTER — Ambulatory Visit: Payer: Self-pay | Admitting: *Deleted

## 2017-11-13 NOTE — Telephone Encounter (Signed)
Pt called in c/o "I think my medicines are too strong".    "I'm just tired all the time".    I had a hard time triaging her due to her vague symptom and not understanding her medications.   I asked her if she had someone to help her with her medicines.   "My daughter comes and fills my pill box for me".    "I guess I'm taking these pills right".  After talking with her for a while it was determined she probably needs help with her medications. I routed a note to Dr. Loren Racer nurse pool making them aware.

## 2017-11-14 NOTE — Telephone Encounter (Signed)
Please advise. Referral to home health for medication management?

## 2017-11-15 NOTE — Telephone Encounter (Signed)
pts daughter notified and did not know about this but would like to know if we can get a nurse out to he pt with her medications. Please advise

## 2017-11-15 NOTE — Telephone Encounter (Signed)
Pls talk to her dtr - the dtr knows her needs Thx

## 2017-11-18 ENCOUNTER — Ambulatory Visit: Payer: Medicare HMO | Admitting: Internal Medicine

## 2017-11-18 ENCOUNTER — Inpatient Hospital Stay (HOSPITAL_COMMUNITY)
Admission: EM | Admit: 2017-11-18 | Discharge: 2017-11-21 | DRG: 684 | Disposition: A | Discharge status: Skilled Nursing Facility | Payer: Medicare HMO | Attending: Family Medicine | Admitting: Family Medicine

## 2017-11-18 ENCOUNTER — Emergency Department (HOSPITAL_COMMUNITY): Payer: Medicare HMO

## 2017-11-18 ENCOUNTER — Encounter (HOSPITAL_COMMUNITY): Payer: Self-pay | Admitting: *Deleted

## 2017-11-18 DIAGNOSIS — Z90710 Acquired absence of both cervix and uterus: Secondary | ICD-10-CM

## 2017-11-18 DIAGNOSIS — Y92009 Unspecified place in unspecified non-institutional (private) residence as the place of occurrence of the external cause: Secondary | ICD-10-CM | POA: Diagnosis not present

## 2017-11-18 DIAGNOSIS — Z880 Allergy status to penicillin: Secondary | ICD-10-CM

## 2017-11-18 DIAGNOSIS — M810 Age-related osteoporosis without current pathological fracture: Secondary | ICD-10-CM | POA: Diagnosis not present

## 2017-11-18 DIAGNOSIS — K5900 Constipation, unspecified: Secondary | ICD-10-CM | POA: Diagnosis present

## 2017-11-18 DIAGNOSIS — M549 Dorsalgia, unspecified: Secondary | ICD-10-CM | POA: Diagnosis present

## 2017-11-18 DIAGNOSIS — R531 Weakness: Secondary | ICD-10-CM | POA: Diagnosis not present

## 2017-11-18 DIAGNOSIS — Z79899 Other long term (current) drug therapy: Secondary | ICD-10-CM

## 2017-11-18 DIAGNOSIS — I4510 Unspecified right bundle-branch block: Secondary | ICD-10-CM | POA: Diagnosis present

## 2017-11-18 DIAGNOSIS — N179 Acute kidney failure, unspecified: Secondary | ICD-10-CM | POA: Diagnosis not present

## 2017-11-18 DIAGNOSIS — G8929 Other chronic pain: Secondary | ICD-10-CM | POA: Diagnosis present

## 2017-11-18 DIAGNOSIS — Z833 Family history of diabetes mellitus: Secondary | ICD-10-CM

## 2017-11-18 DIAGNOSIS — Z9049 Acquired absence of other specified parts of digestive tract: Secondary | ICD-10-CM

## 2017-11-18 DIAGNOSIS — T25222S Burn of second degree of left foot, sequela: Secondary | ICD-10-CM | POA: Diagnosis not present

## 2017-11-18 DIAGNOSIS — G40909 Epilepsy, unspecified, not intractable, without status epilepticus: Secondary | ICD-10-CM | POA: Diagnosis present

## 2017-11-18 DIAGNOSIS — T370X5A Adverse effect of sulfonamides, initial encounter: Secondary | ICD-10-CM | POA: Diagnosis present

## 2017-11-18 DIAGNOSIS — X088XXD Exposure to other specified smoke, fire and flames, subsequent encounter: Secondary | ICD-10-CM | POA: Diagnosis present

## 2017-11-18 DIAGNOSIS — I2510 Atherosclerotic heart disease of native coronary artery without angina pectoris: Secondary | ICD-10-CM | POA: Diagnosis present

## 2017-11-18 DIAGNOSIS — E1121 Type 2 diabetes mellitus with diabetic nephropathy: Secondary | ICD-10-CM | POA: Diagnosis present

## 2017-11-18 DIAGNOSIS — Z8673 Personal history of transient ischemic attack (TIA), and cerebral infarction without residual deficits: Secondary | ICD-10-CM | POA: Diagnosis not present

## 2017-11-18 DIAGNOSIS — T25229D Burn of second degree of unspecified foot, subsequent encounter: Secondary | ICD-10-CM | POA: Diagnosis not present

## 2017-11-18 DIAGNOSIS — I10 Essential (primary) hypertension: Secondary | ICD-10-CM | POA: Diagnosis not present

## 2017-11-18 DIAGNOSIS — Z955 Presence of coronary angioplasty implant and graft: Secondary | ICD-10-CM

## 2017-11-18 DIAGNOSIS — E559 Vitamin D deficiency, unspecified: Secondary | ICD-10-CM | POA: Diagnosis present

## 2017-11-18 DIAGNOSIS — R42 Dizziness and giddiness: Secondary | ICD-10-CM | POA: Diagnosis not present

## 2017-11-18 DIAGNOSIS — E875 Hyperkalemia: Secondary | ICD-10-CM | POA: Diagnosis not present

## 2017-11-18 DIAGNOSIS — Z86718 Personal history of other venous thrombosis and embolism: Secondary | ICD-10-CM

## 2017-11-18 DIAGNOSIS — R498 Other voice and resonance disorders: Secondary | ICD-10-CM | POA: Diagnosis not present

## 2017-11-18 DIAGNOSIS — Z7982 Long term (current) use of aspirin: Secondary | ICD-10-CM | POA: Diagnosis not present

## 2017-11-18 DIAGNOSIS — T25221D Burn of second degree of right foot, subsequent encounter: Secondary | ICD-10-CM

## 2017-11-18 DIAGNOSIS — E878 Other disorders of electrolyte and fluid balance, not elsewhere classified: Secondary | ICD-10-CM | POA: Diagnosis not present

## 2017-11-18 DIAGNOSIS — E538 Deficiency of other specified B group vitamins: Secondary | ICD-10-CM | POA: Diagnosis present

## 2017-11-18 DIAGNOSIS — T25222D Burn of second degree of left foot, subsequent encounter: Secondary | ICD-10-CM | POA: Diagnosis not present

## 2017-11-18 DIAGNOSIS — S8002XA Contusion of left knee, initial encounter: Secondary | ICD-10-CM | POA: Diagnosis not present

## 2017-11-18 DIAGNOSIS — R5383 Other fatigue: Secondary | ICD-10-CM

## 2017-11-18 DIAGNOSIS — Z7952 Long term (current) use of systemic steroids: Secondary | ICD-10-CM

## 2017-11-18 DIAGNOSIS — R404 Transient alteration of awareness: Secondary | ICD-10-CM | POA: Diagnosis not present

## 2017-11-18 DIAGNOSIS — I712 Thoracic aortic aneurysm, without rupture: Secondary | ICD-10-CM | POA: Diagnosis present

## 2017-11-18 DIAGNOSIS — E860 Dehydration: Secondary | ICD-10-CM | POA: Diagnosis present

## 2017-11-18 DIAGNOSIS — Z87440 Personal history of urinary (tract) infections: Secondary | ICD-10-CM

## 2017-11-18 DIAGNOSIS — R41841 Cognitive communication deficit: Secondary | ICD-10-CM | POA: Diagnosis not present

## 2017-11-18 DIAGNOSIS — M130 Polyarthritis, unspecified: Secondary | ICD-10-CM | POA: Diagnosis present

## 2017-11-18 DIAGNOSIS — R2689 Other abnormalities of gait and mobility: Secondary | ICD-10-CM | POA: Diagnosis not present

## 2017-11-18 DIAGNOSIS — T25229A Burn of second degree of unspecified foot, initial encounter: Secondary | ICD-10-CM | POA: Diagnosis present

## 2017-11-18 DIAGNOSIS — Z888 Allergy status to other drugs, medicaments and biological substances status: Secondary | ICD-10-CM

## 2017-11-18 DIAGNOSIS — M6281 Muscle weakness (generalized): Secondary | ICD-10-CM | POA: Diagnosis not present

## 2017-11-18 DIAGNOSIS — Z8249 Family history of ischemic heart disease and other diseases of the circulatory system: Secondary | ICD-10-CM

## 2017-11-18 LAB — COMPREHENSIVE METABOLIC PANEL
ALK PHOS: 102 U/L (ref 38–126)
ALT: 16 U/L (ref 14–54)
ANION GAP: 7 (ref 5–15)
AST: 26 U/L (ref 15–41)
Albumin: 4.1 g/dL (ref 3.5–5.0)
BILIRUBIN TOTAL: 0.5 mg/dL (ref 0.3–1.2)
BUN: 47 mg/dL — ABNORMAL HIGH (ref 6–20)
CALCIUM: 9.6 mg/dL (ref 8.9–10.3)
CO2: 19 mmol/L — AB (ref 22–32)
CREATININE: 1.88 mg/dL — AB (ref 0.44–1.00)
Chloride: 106 mmol/L (ref 101–111)
GFR calc non Af Amer: 24 mL/min — ABNORMAL LOW (ref >60)
GFR, EST AFRICAN AMERICAN: 28 mL/min — AB (ref >60)
Glucose, Bld: 153 mg/dL — ABNORMAL HIGH (ref 65–99)
Potassium: 5.7 mmol/L — ABNORMAL HIGH (ref 3.5–5.1)
SODIUM: 132 mmol/L — AB (ref 135–145)
TOTAL PROTEIN: 8.1 g/dL (ref 6.5–8.1)

## 2017-11-18 LAB — CBC WITH DIFFERENTIAL/PLATELET
BAND NEUTROPHILS: 6 %
BASOS PCT: 1 %
Basophils Absolute: 0.1 10*3/uL (ref 0.0–0.1)
EOS ABS: 0.2 10*3/uL (ref 0.0–0.7)
EOS PCT: 4 %
HEMATOCRIT: 35.1 % — AB (ref 36.0–46.0)
HEMOGLOBIN: 11.7 g/dL — AB (ref 12.0–15.0)
LYMPHS PCT: 18 %
Lymphs Abs: 1.1 10*3/uL (ref 0.7–4.0)
MCH: 30.6 pg (ref 26.0–34.0)
MCHC: 33.3 g/dL (ref 30.0–36.0)
MCV: 91.9 fL (ref 78.0–100.0)
MONOS PCT: 8 %
Monocytes Absolute: 0.5 10*3/uL (ref 0.1–1.0)
NEUTROS ABS: 4.3 10*3/uL (ref 1.7–7.7)
Neutrophils Relative %: 63 %
Platelets: 202 10*3/uL (ref 150–400)
RBC: 3.82 MIL/uL — ABNORMAL LOW (ref 3.87–5.11)
RDW: 12.8 % (ref 11.5–15.5)
WBC: 6.2 10*3/uL (ref 4.0–10.5)

## 2017-11-18 LAB — URINALYSIS, ROUTINE W REFLEX MICROSCOPIC
Bilirubin Urine: NEGATIVE
Glucose, UA: NEGATIVE mg/dL
HGB URINE DIPSTICK: NEGATIVE
Ketones, ur: NEGATIVE mg/dL
Leukocytes, UA: NEGATIVE
NITRITE: NEGATIVE
Protein, ur: NEGATIVE mg/dL
SPECIFIC GRAVITY, URINE: 1.015 (ref 1.005–1.030)
pH: 5 (ref 5.0–8.0)

## 2017-11-18 LAB — BRAIN NATRIURETIC PEPTIDE: B Natriuretic Peptide: 54.1 pg/mL (ref 0.0–100.0)

## 2017-11-18 LAB — I-STAT CG4 LACTIC ACID, ED: Lactic Acid, Venous: 1.02 mmol/L (ref 0.5–1.9)

## 2017-11-18 LAB — I-STAT TROPONIN, ED: TROPONIN I, POC: 0.02 ng/mL (ref 0.00–0.08)

## 2017-11-18 LAB — CBG MONITORING, ED: GLUCOSE-CAPILLARY: 185 mg/dL — AB (ref 65–99)

## 2017-11-18 LAB — ACETAMINOPHEN LEVEL: Acetaminophen (Tylenol), Serum: <10 ug/mL — ABNORMAL LOW (ref 10–30)

## 2017-11-18 MED ORDER — ONDANSETRON HCL 4 MG/2ML IJ SOLN
4.0000 mg | Freq: Four times a day (QID) | INTRAMUSCULAR | Status: DC | PRN
Start: 2017-11-18 — End: 2017-11-22

## 2017-11-18 MED ORDER — ASPIRIN EC 325 MG PO TBEC
325.0000 mg | DELAYED_RELEASE_TABLET | Freq: Every day | ORAL | Status: DC
  Administered 2017-11-19 – 2017-11-21 (×3): 325 mg via ORAL
  Filled 2017-11-18 (×4): qty 1

## 2017-11-18 MED ORDER — LEVETIRACETAM 500 MG PO TABS
500.0000 mg | ORAL_TABLET | Freq: Two times a day (BID) | ORAL | Status: DC
  Administered 2017-11-19 – 2017-11-21 (×6): 500 mg via ORAL
  Filled 2017-11-18 (×6): qty 1

## 2017-11-18 MED ORDER — ENOXAPARIN SODIUM 40 MG/0.4ML ~~LOC~~ SOLN
40.0000 mg | SUBCUTANEOUS | Status: DC
  Administered 2017-11-19 – 2017-11-21 (×3): 40 mg via SUBCUTANEOUS
  Filled 2017-11-18 (×4): qty 0.4

## 2017-11-18 MED ORDER — SODIUM CHLORIDE 0.9 % IV BOLUS (SEPSIS)
250.0000 mL | Freq: Once | INTRAVENOUS | Status: AC
  Administered 2017-11-18: 250 mL via INTRAVENOUS

## 2017-11-18 MED ORDER — ONDANSETRON HCL 4 MG PO TABS: 4.0000 mg | ORAL_TABLET | Freq: Four times a day (QID) | ORAL | Status: DC | PRN

## 2017-11-18 MED ORDER — ACETAMINOPHEN 325 MG PO TABS
650.0000 mg | ORAL_TABLET | Freq: Four times a day (QID) | ORAL | Status: DC | PRN
  Administered 2017-11-19: 650 mg via ORAL
  Filled 2017-11-18: qty 2

## 2017-11-18 MED ORDER — INSULIN ASPART 100 UNIT/ML ~~LOC~~ SOLN
5.0000 [IU] | Freq: Once | SUBCUTANEOUS | Status: AC
  Administered 2017-11-18: 5 [IU] via INTRAVENOUS
  Filled 2017-11-18: qty 1

## 2017-11-18 MED ORDER — MECLIZINE HCL 25 MG PO TABS
12.5000 mg | ORAL_TABLET | Freq: Three times a day (TID) | ORAL | Status: DC | PRN
  Filled 2017-11-18: qty 1

## 2017-11-18 MED ORDER — CARVEDILOL 25 MG PO TABS
25.0000 mg | ORAL_TABLET | Freq: Two times a day (BID) | ORAL | Status: DC
  Administered 2017-11-18 – 2017-11-21 (×7): 25 mg via ORAL
  Filled 2017-11-18 (×7): qty 1

## 2017-11-18 MED ORDER — SODIUM CHLORIDE 0.9 % IV BOLUS (SEPSIS)
500.0000 mL | Freq: Once | INTRAVENOUS | Status: AC
  Administered 2017-11-18: 500 mL via INTRAVENOUS

## 2017-11-18 MED ORDER — ACETAMINOPHEN 650 MG RE SUPP: 650.0000 mg | Freq: Four times a day (QID) | RECTAL | Status: DC | PRN

## 2017-11-18 MED ORDER — SODIUM CHLORIDE 0.9 % IV SOLN
INTRAVENOUS | Status: DC
  Administered 2017-11-18 – 2017-11-20 (×4): via INTRAVENOUS

## 2017-11-18 MED ORDER — INSULIN ASPART 100 UNIT/ML ~~LOC~~ SOLN
0.0000 [IU] | Freq: Three times a day (TID) | SUBCUTANEOUS | Status: DC
  Administered 2017-11-19: 2 [IU] via SUBCUTANEOUS
  Administered 2017-11-20 (×2): 1 [IU] via SUBCUTANEOUS
  Administered 2017-11-21: 2 [IU] via SUBCUTANEOUS
  Administered 2017-11-21: 1 [IU] via SUBCUTANEOUS
  Filled 2017-11-18 (×5): qty 1

## 2017-11-18 MED ORDER — PREDNISONE 5 MG PO TABS
5.0000 mg | ORAL_TABLET | Freq: Every day | ORAL | Status: DC
  Administered 2017-11-19 – 2017-11-21 (×3): 5 mg via ORAL
  Filled 2017-11-18 (×4): qty 1

## 2017-11-18 MED ORDER — DEXTROSE 50 % IV SOLN
1.0000 | Freq: Once | INTRAVENOUS | Status: AC
  Administered 2017-11-18: 50 mL via INTRAVENOUS
  Filled 2017-11-18: qty 50
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
## 2017-11-18 NOTE — ED Notes (Signed)
Bed: [facility] Expected date:  Expected time:  Means of arrival:  Comments: EMS-lethargic
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
## 2017-11-19 ENCOUNTER — Other Ambulatory Visit: Payer: Self-pay

## 2017-11-19 DIAGNOSIS — N179 Acute kidney failure, unspecified: Secondary | ICD-10-CM | POA: Diagnosis not present

## 2017-11-19 DIAGNOSIS — E860 Dehydration: Secondary | ICD-10-CM

## 2017-11-19 DIAGNOSIS — R531 Weakness: Secondary | ICD-10-CM | POA: Diagnosis not present

## 2017-11-19 DIAGNOSIS — E875 Hyperkalemia: Secondary | ICD-10-CM | POA: Diagnosis not present

## 2017-11-19 LAB — GLUCOSE, CAPILLARY
GLUCOSE-CAPILLARY: 113 mg/dL — AB (ref 65–99)
Glucose-Capillary: 116 mg/dL — ABNORMAL HIGH (ref 65–99)
Glucose-Capillary: 194 mg/dL — ABNORMAL HIGH (ref 65–99)

## 2017-11-19 LAB — BASIC METABOLIC PANEL
ANION GAP: 6 (ref 5–15)
BUN: 39 mg/dL — ABNORMAL HIGH (ref 6–20)
CHLORIDE: 114 mmol/L — AB (ref 101–111)
CO2: 16 mmol/L — ABNORMAL LOW (ref 22–32)
Calcium: 8.7 mg/dL — ABNORMAL LOW (ref 8.9–10.3)
Creatinine, Ser: 1.46 mg/dL — ABNORMAL HIGH (ref 0.44–1.00)
GFR calc Af Amer: 37 mL/min — ABNORMAL LOW (ref >60)
GFR calc non Af Amer: 32 mL/min — ABNORMAL LOW (ref >60)
Glucose, Bld: 86 mg/dL (ref 65–99)
POTASSIUM: 5.2 mmol/L — AB (ref 3.5–5.1)
SODIUM: 136 mmol/L (ref 135–145)

## 2017-11-19 LAB — CBG MONITORING, ED: Glucose-Capillary: 72 mg/dL (ref 65–99)

## 2017-11-19 MED ORDER — ACETAMINOPHEN 325 MG PO TABS
650.0000 mg | ORAL_TABLET | ORAL | Status: DC | PRN
  Administered 2017-11-20 – 2017-11-21 (×4): 650 mg via ORAL
  Filled 2017-11-19 (×4): qty 2
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
## 2017-11-19 NOTE — ED Notes (Signed)
Bed: WA08 Expected date:  Expected time:  Means of arrival:  Comments: 
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
## 2017-11-19 NOTE — Plan of Care (Signed)
  Nutrition: Adequate nutrition will be maintained 11/19/2017 1937 - Progressing by William Dalton, RN   Pain Managment: General experience of comfort will improve 11/19/2017 1937 - Progressing by William Dalton, RN
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
## 2017-11-19 NOTE — ED Notes (Signed)
Pt given breakfast tray

## 2017-11-20 DIAGNOSIS — Z7982 Long term (current) use of aspirin: Secondary | ICD-10-CM | POA: Diagnosis not present

## 2017-11-20 DIAGNOSIS — M549 Dorsalgia, unspecified: Secondary | ICD-10-CM | POA: Diagnosis present

## 2017-11-20 DIAGNOSIS — Y92009 Unspecified place in unspecified non-institutional (private) residence as the place of occurrence of the external cause: Secondary | ICD-10-CM | POA: Diagnosis not present

## 2017-11-20 DIAGNOSIS — T370X5A Adverse effect of sulfonamides, initial encounter: Secondary | ICD-10-CM | POA: Diagnosis present

## 2017-11-20 DIAGNOSIS — N179 Acute kidney failure, unspecified: Secondary | ICD-10-CM | POA: Diagnosis present

## 2017-11-20 DIAGNOSIS — G40909 Epilepsy, unspecified, not intractable, without status epilepticus: Secondary | ICD-10-CM | POA: Diagnosis present

## 2017-11-20 DIAGNOSIS — I4510 Unspecified right bundle-branch block: Secondary | ICD-10-CM | POA: Diagnosis present

## 2017-11-20 DIAGNOSIS — E875 Hyperkalemia: Secondary | ICD-10-CM | POA: Diagnosis present

## 2017-11-20 DIAGNOSIS — T25222D Burn of second degree of left foot, subsequent encounter: Secondary | ICD-10-CM | POA: Diagnosis not present

## 2017-11-20 DIAGNOSIS — E559 Vitamin D deficiency, unspecified: Secondary | ICD-10-CM | POA: Diagnosis present

## 2017-11-20 DIAGNOSIS — E538 Deficiency of other specified B group vitamins: Secondary | ICD-10-CM | POA: Diagnosis present

## 2017-11-20 DIAGNOSIS — I2510 Atherosclerotic heart disease of native coronary artery without angina pectoris: Secondary | ICD-10-CM | POA: Diagnosis present

## 2017-11-20 DIAGNOSIS — I712 Thoracic aortic aneurysm, without rupture: Secondary | ICD-10-CM | POA: Diagnosis present

## 2017-11-20 DIAGNOSIS — T25221D Burn of second degree of right foot, subsequent encounter: Secondary | ICD-10-CM | POA: Diagnosis not present

## 2017-11-20 DIAGNOSIS — Z79899 Other long term (current) drug therapy: Secondary | ICD-10-CM | POA: Diagnosis not present

## 2017-11-20 DIAGNOSIS — M130 Polyarthritis, unspecified: Secondary | ICD-10-CM | POA: Diagnosis present

## 2017-11-20 DIAGNOSIS — K5900 Constipation, unspecified: Secondary | ICD-10-CM | POA: Diagnosis present

## 2017-11-20 DIAGNOSIS — R531 Weakness: Secondary | ICD-10-CM | POA: Diagnosis not present

## 2017-11-20 DIAGNOSIS — E878 Other disorders of electrolyte and fluid balance, not elsewhere classified: Secondary | ICD-10-CM | POA: Diagnosis present

## 2017-11-20 DIAGNOSIS — Z7952 Long term (current) use of systemic steroids: Secondary | ICD-10-CM | POA: Diagnosis not present

## 2017-11-20 DIAGNOSIS — G8929 Other chronic pain: Secondary | ICD-10-CM | POA: Diagnosis present

## 2017-11-20 DIAGNOSIS — M810 Age-related osteoporosis without current pathological fracture: Secondary | ICD-10-CM | POA: Diagnosis present

## 2017-11-20 DIAGNOSIS — I10 Essential (primary) hypertension: Secondary | ICD-10-CM | POA: Diagnosis present

## 2017-11-20 DIAGNOSIS — X088XXD Exposure to other specified smoke, fire and flames, subsequent encounter: Secondary | ICD-10-CM | POA: Diagnosis present

## 2017-11-20 DIAGNOSIS — Z8673 Personal history of transient ischemic attack (TIA), and cerebral infarction without residual deficits: Secondary | ICD-10-CM | POA: Diagnosis not present

## 2017-11-20 DIAGNOSIS — E1121 Type 2 diabetes mellitus with diabetic nephropathy: Secondary | ICD-10-CM | POA: Diagnosis present

## 2017-11-20 DIAGNOSIS — E860 Dehydration: Secondary | ICD-10-CM | POA: Diagnosis present

## 2017-11-20 LAB — CBC
HEMATOCRIT: 29.9 % — AB (ref 36.0–46.0)
Hemoglobin: 9.7 g/dL — ABNORMAL LOW (ref 12.0–15.0)
MCH: 30 pg (ref 26.0–34.0)
MCHC: 32.4 g/dL (ref 30.0–36.0)
MCV: 92.6 fL (ref 78.0–100.0)
PLATELETS: 179 10*3/uL (ref 150–400)
RBC: 3.23 MIL/uL — AB (ref 3.87–5.11)
RDW: 12.9 % (ref 11.5–15.5)
WBC: 4.5 10*3/uL (ref 4.0–10.5)

## 2017-11-20 LAB — URINE CULTURE

## 2017-11-20 LAB — RENAL FUNCTION PANEL
Albumin: 3.3 g/dL — ABNORMAL LOW (ref 3.5–5.0)
Anion gap: 4 — ABNORMAL LOW (ref 5–15)
BUN: 33 mg/dL — ABNORMAL HIGH (ref 6–20)
CO2: 19 mmol/L — ABNORMAL LOW (ref 22–32)
CREATININE: 1.32 mg/dL — AB (ref 0.44–1.00)
Calcium: 8.7 mg/dL — ABNORMAL LOW (ref 8.9–10.3)
Chloride: 113 mmol/L — ABNORMAL HIGH (ref 101–111)
GFR, EST AFRICAN AMERICAN: 42 mL/min — AB (ref >60)
GFR, EST NON AFRICAN AMERICAN: 36 mL/min — AB (ref >60)
Glucose, Bld: 101 mg/dL — ABNORMAL HIGH (ref 65–99)
POTASSIUM: 5.3 mmol/L — AB (ref 3.5–5.1)
Phosphorus: 3 mg/dL (ref 2.5–4.6)
Sodium: 136 mmol/L (ref 135–145)

## 2017-11-20 LAB — GLUCOSE, CAPILLARY
Glucose-Capillary: 84 mg/dL (ref 65–99)
Glucose-Capillary: 132 mg/dL — ABNORMAL HIGH (ref 65–99)
Glucose-Capillary: 137 mg/dL — ABNORMAL HIGH (ref 65–99)
Glucose-Capillary: 138 mg/dL — ABNORMAL HIGH (ref 65–99)

## 2017-11-20 MED ORDER — SODIUM POLYSTYRENE SULFONATE 15 GM/60ML PO SUSP
15.0000 g | Freq: Once | ORAL | Status: DC
  Filled 2017-11-20: qty 60

## 2017-11-20 MED ORDER — SODIUM CHLORIDE 0.9 % IV SOLN
INTRAVENOUS | Status: DC
  Administered 2017-11-20: 22:00:00 via INTRAVENOUS

## 2017-11-20 MED ORDER — SODIUM POLYSTYRENE SULFONATE PO POWD
15.0000 g | Freq: Once | ORAL | Status: AC
  Administered 2017-11-20: 15 g via ORAL
  Filled 2017-11-20: qty 15

## 2017-11-20 NOTE — Plan of Care (Signed)
  Nutrition: Adequate nutrition will be maintained 11/20/2017 0749 - Progressing by William Dalton, RN 11/19/2017 1937 - Progressing by William Dalton, RN   Elimination: Will not experience complications related to bowel motility 11/20/2017 0749 - Progressing by William Dalton, RN 11/19/2017 1937 - Progressing by William Dalton, RN   Pain Managment: General experience of comfort will improve 11/20/2017 0749 - Progressing by William Dalton, RN 11/19/2017 1937 - Progressing by William Dalton, RN
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
## 2017-11-21 DIAGNOSIS — S8002XA Contusion of left knee, initial encounter: Secondary | ICD-10-CM | POA: Diagnosis not present

## 2017-11-21 DIAGNOSIS — E860 Dehydration: Secondary | ICD-10-CM | POA: Diagnosis not present

## 2017-11-21 DIAGNOSIS — T25221A Burn of second degree of right foot, initial encounter: Secondary | ICD-10-CM | POA: Diagnosis not present

## 2017-11-21 DIAGNOSIS — T25222S Burn of second degree of left foot, sequela: Secondary | ICD-10-CM | POA: Diagnosis not present

## 2017-11-21 DIAGNOSIS — R531 Weakness: Secondary | ICD-10-CM | POA: Diagnosis not present

## 2017-11-21 DIAGNOSIS — R2689 Other abnormalities of gait and mobility: Secondary | ICD-10-CM | POA: Diagnosis not present

## 2017-11-21 DIAGNOSIS — M25572 Pain in left ankle and joints of left foot: Secondary | ICD-10-CM | POA: Diagnosis not present

## 2017-11-21 DIAGNOSIS — M545 Low back pain: Secondary | ICD-10-CM | POA: Diagnosis not present

## 2017-11-21 DIAGNOSIS — I10 Essential (primary) hypertension: Secondary | ICD-10-CM | POA: Diagnosis not present

## 2017-11-21 DIAGNOSIS — E119 Type 2 diabetes mellitus without complications: Secondary | ICD-10-CM | POA: Diagnosis not present

## 2017-11-21 DIAGNOSIS — R498 Other voice and resonance disorders: Secondary | ICD-10-CM | POA: Diagnosis not present

## 2017-11-21 DIAGNOSIS — M6281 Muscle weakness (generalized): Secondary | ICD-10-CM | POA: Diagnosis not present

## 2017-11-21 DIAGNOSIS — M25571 Pain in right ankle and joints of right foot: Secondary | ICD-10-CM | POA: Diagnosis not present

## 2017-11-21 DIAGNOSIS — R2681 Unsteadiness on feet: Secondary | ICD-10-CM | POA: Diagnosis not present

## 2017-11-21 DIAGNOSIS — T25222A Burn of second degree of left foot, initial encounter: Secondary | ICD-10-CM | POA: Diagnosis not present

## 2017-11-21 DIAGNOSIS — R41841 Cognitive communication deficit: Secondary | ICD-10-CM | POA: Diagnosis not present

## 2017-11-21 LAB — GLUCOSE, CAPILLARY
GLUCOSE-CAPILLARY: 157 mg/dL — AB (ref 65–99)
Glucose-Capillary: 71 mg/dL (ref 65–99)
Glucose-Capillary: 138 mg/dL — ABNORMAL HIGH (ref 65–99)

## 2017-11-21 LAB — BASIC METABOLIC PANEL WITH GFR
Anion gap: 6 (ref 5–15)
BUN: 25 mg/dL — ABNORMAL HIGH (ref 6–20)
CO2: 15 mmol/L — ABNORMAL LOW (ref 22–32)
Calcium: 8.3 mg/dL — ABNORMAL LOW (ref 8.9–10.3)
Chloride: 116 mmol/L — ABNORMAL HIGH (ref 101–111)
Creatinine, Ser: 0.94 mg/dL (ref 0.44–1.00)
GFR calc Af Amer: >60 mL/min
GFR calc non Af Amer: 55 mL/min — ABNORMAL LOW
Glucose, Bld: 77 mg/dL (ref 65–99)
Potassium: 4.7 mmol/L (ref 3.5–5.1)
Sodium: 137 mmol/L (ref 135–145)

## 2017-11-21 MED ORDER — LIP MEDEX EX OINT
TOPICAL_OINTMENT | CUTANEOUS | Status: AC
  Administered 2017-11-21: 1
  Filled 2017-11-21: qty 7

## 2017-11-21 NOTE — Clinical Social Work Placement (Signed)
Pt discharged and will admit to [facility] for rehab- [phone] Daughter at bedside aware and agreeable to plan Arranged ptar transportation See below for placement details- all DC information provided to facility via the HUB   CLINICAL SOCIAL WORK PLACEMENT  NOTE  Date:  11/21/2017  Patient Details  Name: [name] MRN: [id number] Date of Birth: [birth year]  Clinical Social Work is seeking post-discharge placement for this patient at the Skilled  Nursing Facility level of care (*CSW will initial, date and re-position this form in  chart as items are completed):  Yes   Patient/family provided with [place] Clinical Social Work Department's list of facilities offering this level of care within the geographic area requested by the patient (or if unable, by the patient's family).  Yes   Patient/family informed of their freedom to choose among providers that offer the needed level of care, that participate in Medicare, Medicaid or managed care program needed by the patient, have an available bed and are willing to accept the patient.  Yes   Patient/family informed of [facility]'s ownership interest in [facility] and [facility], as well as of the fact that they are under no obligation to receive care at these facilities.  PASRR submitted to EDS on       PASRR number received on       Existing PASRR number confirmed on 11/20/17     FL2 transmitted to all facilities in geographic area requested by pt/family on 11/20/17     FL2 transmitted to all facilities within larger geographic area on       Patient informed that his/her managed care company has contracts with or will negotiate with certain facilities, including the following:        Yes   Patient/family informed of bed offers received.  Patient chooses bed at [facility]     Physician recommends and patient chooses bed at [facility]    Patient to be transferred to [facility] on 11/21/17.  Patient to  be transferred to facility by [facility]     Patient family notified on 11/21/17 of transfer.  Name of family member notified:  daughter     PHYSICIAN       Additional Comment:    _______________________________________________ Nelwyn Salisbury, LCSW 11/21/2017, 4:21 PM
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
## 2017-11-21 NOTE — Progress Notes (Signed)
Gave report to Essence, Charity fundraiser at [employer] at 2052. Left number in case she had additional questions.
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
## 2017-11-21 NOTE — Progress Notes (Signed)
Pt has selected [facility] for rehab at DC and facility was notified [facility] approved authorization.  Will follow and assist with transition there at DC.  Ilean Skill, MSW, LCSW Clinical Social Work 11/21/2017 [phone]

## 2017-11-22 ENCOUNTER — Telehealth: Payer: Self-pay | Admitting: *Deleted

## 2017-11-22 DIAGNOSIS — T25221A Burn of second degree of right foot, initial encounter: Secondary | ICD-10-CM | POA: Diagnosis not present

## 2017-11-22 DIAGNOSIS — E119 Type 2 diabetes mellitus without complications: Secondary | ICD-10-CM | POA: Diagnosis not present

## 2017-11-22 DIAGNOSIS — I10 Essential (primary) hypertension: Secondary | ICD-10-CM | POA: Diagnosis not present

## 2017-11-22 DIAGNOSIS — T25222A Burn of second degree of left foot, initial encounter: Secondary | ICD-10-CM | POA: Diagnosis not present
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
## 2017-11-25 DIAGNOSIS — E119 Type 2 diabetes mellitus without complications: Secondary | ICD-10-CM | POA: Diagnosis not present

## 2017-11-25 DIAGNOSIS — M25571 Pain in right ankle and joints of right foot: Secondary | ICD-10-CM | POA: Diagnosis not present

## 2017-11-25 DIAGNOSIS — M25572 Pain in left ankle and joints of left foot: Secondary | ICD-10-CM | POA: Diagnosis not present

## 2017-11-25 DIAGNOSIS — M545 Low back pain: Secondary | ICD-10-CM | POA: Diagnosis not present

## 2017-11-25 DIAGNOSIS — R2681 Unsteadiness on feet: Secondary | ICD-10-CM | POA: Diagnosis not present

## 2017-11-25 DIAGNOSIS — M6281 Muscle weakness (generalized): Secondary | ICD-10-CM | POA: Diagnosis not present

## 2017-11-26 ENCOUNTER — Ambulatory Visit: Payer: Medicare HMO | Admitting: Internal Medicine

## 2017-11-27 DIAGNOSIS — M25572 Pain in left ankle and joints of left foot: Secondary | ICD-10-CM | POA: Diagnosis not present

## 2017-11-27 DIAGNOSIS — E119 Type 2 diabetes mellitus without complications: Secondary | ICD-10-CM | POA: Diagnosis not present

## 2017-11-27 DIAGNOSIS — M545 Low back pain: Secondary | ICD-10-CM | POA: Diagnosis not present

## 2017-11-27 DIAGNOSIS — M6281 Muscle weakness (generalized): Secondary | ICD-10-CM | POA: Diagnosis not present

## 2017-11-27 DIAGNOSIS — M25571 Pain in right ankle and joints of right foot: Secondary | ICD-10-CM | POA: Diagnosis not present

## 2017-11-27 DIAGNOSIS — R2681 Unsteadiness on feet: Secondary | ICD-10-CM | POA: Diagnosis not present

## 2017-11-28 DIAGNOSIS — M6281 Muscle weakness (generalized): Secondary | ICD-10-CM | POA: Diagnosis not present

## 2017-11-28 DIAGNOSIS — M545 Low back pain: Secondary | ICD-10-CM | POA: Diagnosis not present

## 2017-11-28 DIAGNOSIS — R2681 Unsteadiness on feet: Secondary | ICD-10-CM | POA: Diagnosis not present

## 2017-11-28 DIAGNOSIS — E119 Type 2 diabetes mellitus without complications: Secondary | ICD-10-CM | POA: Diagnosis not present

## 2017-11-28 DIAGNOSIS — M25572 Pain in left ankle and joints of left foot: Secondary | ICD-10-CM | POA: Diagnosis not present

## 2017-11-28 DIAGNOSIS — M25571 Pain in right ankle and joints of right foot: Secondary | ICD-10-CM | POA: Diagnosis not present

## 2017-12-03 DIAGNOSIS — M545 Low back pain: Secondary | ICD-10-CM | POA: Diagnosis not present

## 2017-12-03 DIAGNOSIS — M25572 Pain in left ankle and joints of left foot: Secondary | ICD-10-CM | POA: Diagnosis not present

## 2017-12-03 DIAGNOSIS — E119 Type 2 diabetes mellitus without complications: Secondary | ICD-10-CM | POA: Diagnosis not present

## 2017-12-03 DIAGNOSIS — R2681 Unsteadiness on feet: Secondary | ICD-10-CM | POA: Diagnosis not present

## 2017-12-03 DIAGNOSIS — M6281 Muscle weakness (generalized): Secondary | ICD-10-CM | POA: Diagnosis not present

## 2017-12-03 DIAGNOSIS — M25571 Pain in right ankle and joints of right foot: Secondary | ICD-10-CM | POA: Diagnosis not present

## 2017-12-04 DIAGNOSIS — E119 Type 2 diabetes mellitus without complications: Secondary | ICD-10-CM | POA: Diagnosis not present

## 2017-12-04 DIAGNOSIS — I10 Essential (primary) hypertension: Secondary | ICD-10-CM | POA: Diagnosis not present

## 2017-12-06 DIAGNOSIS — T25222A Burn of second degree of left foot, initial encounter: Secondary | ICD-10-CM | POA: Diagnosis not present

## 2017-12-06 DIAGNOSIS — T25221A Burn of second degree of right foot, initial encounter: Secondary | ICD-10-CM | POA: Diagnosis not present

## 2017-12-06 DIAGNOSIS — I10 Essential (primary) hypertension: Secondary | ICD-10-CM | POA: Diagnosis not present

## 2017-12-06 DIAGNOSIS — E119 Type 2 diabetes mellitus without complications: Secondary | ICD-10-CM | POA: Diagnosis not present

## 2017-12-09 DIAGNOSIS — G40909 Epilepsy, unspecified, not intractable, without status epilepticus: Secondary | ICD-10-CM | POA: Diagnosis not present

## 2017-12-09 DIAGNOSIS — T25221D Burn of second degree of right foot, subsequent encounter: Secondary | ICD-10-CM | POA: Diagnosis not present

## 2017-12-09 DIAGNOSIS — T25232D Burn of second degree of left toe(s) (nail), subsequent encounter: Secondary | ICD-10-CM | POA: Diagnosis not present

## 2017-12-09 DIAGNOSIS — I5032 Chronic diastolic (congestive) heart failure: Secondary | ICD-10-CM | POA: Diagnosis not present

## 2017-12-09 DIAGNOSIS — X118XXD Contact with other hot tap-water, subsequent encounter: Secondary | ICD-10-CM | POA: Diagnosis not present

## 2017-12-09 DIAGNOSIS — M353 Polymyalgia rheumatica: Secondary | ICD-10-CM | POA: Diagnosis not present

## 2017-12-09 DIAGNOSIS — H8110 Benign paroxysmal vertigo, unspecified ear: Secondary | ICD-10-CM | POA: Diagnosis not present

## 2017-12-09 DIAGNOSIS — I110 Hypertensive heart disease with heart failure: Secondary | ICD-10-CM | POA: Diagnosis not present

## 2017-12-09 DIAGNOSIS — E1140 Type 2 diabetes mellitus with diabetic neuropathy, unspecified: Secondary | ICD-10-CM | POA: Diagnosis not present

## 2017-12-10 DIAGNOSIS — X118XXD Contact with other hot tap-water, subsequent encounter: Secondary | ICD-10-CM | POA: Diagnosis not present

## 2017-12-10 DIAGNOSIS — I110 Hypertensive heart disease with heart failure: Secondary | ICD-10-CM | POA: Diagnosis not present

## 2017-12-10 DIAGNOSIS — G40909 Epilepsy, unspecified, not intractable, without status epilepticus: Secondary | ICD-10-CM | POA: Diagnosis not present

## 2017-12-10 DIAGNOSIS — E1140 Type 2 diabetes mellitus with diabetic neuropathy, unspecified: Secondary | ICD-10-CM | POA: Diagnosis not present

## 2017-12-10 DIAGNOSIS — H8110 Benign paroxysmal vertigo, unspecified ear: Secondary | ICD-10-CM | POA: Diagnosis not present

## 2017-12-10 DIAGNOSIS — I5032 Chronic diastolic (congestive) heart failure: Secondary | ICD-10-CM | POA: Diagnosis not present

## 2017-12-10 DIAGNOSIS — M353 Polymyalgia rheumatica: Secondary | ICD-10-CM | POA: Diagnosis not present

## 2017-12-10 DIAGNOSIS — T25232D Burn of second degree of left toe(s) (nail), subsequent encounter: Secondary | ICD-10-CM | POA: Diagnosis not present

## 2017-12-10 DIAGNOSIS — T25221D Burn of second degree of right foot, subsequent encounter: Secondary | ICD-10-CM | POA: Diagnosis not present

## 2017-12-11 ENCOUNTER — Other Ambulatory Visit: Payer: Self-pay

## 2017-12-11 ENCOUNTER — Telehealth: Payer: Self-pay | Admitting: Internal Medicine

## 2017-12-11 NOTE — Telephone Encounter (Signed)
Please advise 
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
## 2017-12-11 NOTE — Telephone Encounter (Signed)
Copied from CRM [id number]. Topic: Quick Communication - See Telephone Encounter >> Dec 11, 2017  9:39 AM Eston Mould B wrote: CRM for notification. See Telephone encounter for:  Pt is asking for Dr to call her in a medication for her cold,  she says she is coughing a lot and coughing up phlegm  [address] - [place], [place] - [address] [phone] (Phone) [phone] (Fax)   12/11/17.

## 2017-12-12 DIAGNOSIS — I110 Hypertensive heart disease with heart failure: Secondary | ICD-10-CM | POA: Diagnosis not present

## 2017-12-12 DIAGNOSIS — M353 Polymyalgia rheumatica: Secondary | ICD-10-CM | POA: Diagnosis not present

## 2017-12-12 DIAGNOSIS — X118XXD Contact with other hot tap-water, subsequent encounter: Secondary | ICD-10-CM | POA: Diagnosis not present

## 2017-12-12 DIAGNOSIS — T25221D Burn of second degree of right foot, subsequent encounter: Secondary | ICD-10-CM | POA: Diagnosis not present

## 2017-12-12 DIAGNOSIS — H8110 Benign paroxysmal vertigo, unspecified ear: Secondary | ICD-10-CM | POA: Diagnosis not present

## 2017-12-12 DIAGNOSIS — T25232D Burn of second degree of left toe(s) (nail), subsequent encounter: Secondary | ICD-10-CM | POA: Diagnosis not present

## 2017-12-12 DIAGNOSIS — E1140 Type 2 diabetes mellitus with diabetic neuropathy, unspecified: Secondary | ICD-10-CM | POA: Diagnosis not present

## 2017-12-12 DIAGNOSIS — I5032 Chronic diastolic (congestive) heart failure: Secondary | ICD-10-CM | POA: Diagnosis not present

## 2017-12-12 DIAGNOSIS — G40909 Epilepsy, unspecified, not intractable, without status epilepticus: Secondary | ICD-10-CM | POA: Diagnosis not present

## 2017-12-13 ENCOUNTER — Inpatient Hospital Stay: Payer: Medicare HMO | Admitting: Internal Medicine

## 2017-12-13 ENCOUNTER — Telehealth: Payer: Self-pay | Admitting: Internal Medicine

## 2017-12-13 DIAGNOSIS — T25221D Burn of second degree of right foot, subsequent encounter: Secondary | ICD-10-CM | POA: Diagnosis not present

## 2017-12-13 DIAGNOSIS — E1140 Type 2 diabetes mellitus with diabetic neuropathy, unspecified: Secondary | ICD-10-CM | POA: Diagnosis not present

## 2017-12-13 DIAGNOSIS — X118XXD Contact with other hot tap-water, subsequent encounter: Secondary | ICD-10-CM | POA: Diagnosis not present

## 2017-12-13 DIAGNOSIS — H8110 Benign paroxysmal vertigo, unspecified ear: Secondary | ICD-10-CM | POA: Diagnosis not present

## 2017-12-13 DIAGNOSIS — G40909 Epilepsy, unspecified, not intractable, without status epilepticus: Secondary | ICD-10-CM | POA: Diagnosis not present

## 2017-12-13 DIAGNOSIS — I5032 Chronic diastolic (congestive) heart failure: Secondary | ICD-10-CM | POA: Diagnosis not present

## 2017-12-13 DIAGNOSIS — M353 Polymyalgia rheumatica: Secondary | ICD-10-CM | POA: Diagnosis not present

## 2017-12-13 DIAGNOSIS — T25232D Burn of second degree of left toe(s) (nail), subsequent encounter: Secondary | ICD-10-CM | POA: Diagnosis not present

## 2017-12-13 DIAGNOSIS — I110 Hypertensive heart disease with heart failure: Secondary | ICD-10-CM | POA: Diagnosis not present

## 2017-12-13 NOTE — Telephone Encounter (Signed)
Pt is calling back . Please advise

## 2017-12-13 NOTE — Telephone Encounter (Signed)
Copied from CRM [id number]. Topic: Quick Communication - See Telephone Encounter >> Dec 13, 2017  4:39 PM Cipriano Bunker wrote: CRM for notification. See Telephone encounter for:   [name] [phone] asking for verbal order for : 2 x 5 weeks for strength, mobility, balance and gait  12/13/17.

## 2017-12-13 NOTE — Telephone Encounter (Signed)
Copied from CRM [id number]. Topic: Quick Communication - See Telephone Encounter >> Dec 13, 2017  8:45 AM Guinevere Ferrari, NT wrote: CRM for notification. See Telephone encounter for: [name] is calling to get verbal orders for patient for OT for 1 time week for 1 week and 2 times a week for 3 weeks and 1 time a week for 1 week. She would like a call back at [phone].  12/13/17.

## 2017-12-15 NOTE — Telephone Encounter (Signed)
Okay OT.  Thank you

## 2017-12-16 ENCOUNTER — Other Ambulatory Visit: Payer: Self-pay | Admitting: *Deleted

## 2017-12-16 DIAGNOSIS — E1140 Type 2 diabetes mellitus with diabetic neuropathy, unspecified: Secondary | ICD-10-CM | POA: Diagnosis not present

## 2017-12-16 DIAGNOSIS — T25232D Burn of second degree of left toe(s) (nail), subsequent encounter: Secondary | ICD-10-CM | POA: Diagnosis not present

## 2017-12-16 DIAGNOSIS — I110 Hypertensive heart disease with heart failure: Secondary | ICD-10-CM | POA: Diagnosis not present

## 2017-12-16 DIAGNOSIS — X118XXD Contact with other hot tap-water, subsequent encounter: Secondary | ICD-10-CM | POA: Diagnosis not present

## 2017-12-16 DIAGNOSIS — M353 Polymyalgia rheumatica: Secondary | ICD-10-CM | POA: Diagnosis not present

## 2017-12-16 DIAGNOSIS — I5032 Chronic diastolic (congestive) heart failure: Secondary | ICD-10-CM | POA: Diagnosis not present

## 2017-12-16 DIAGNOSIS — G40909 Epilepsy, unspecified, not intractable, without status epilepticus: Secondary | ICD-10-CM | POA: Diagnosis not present

## 2017-12-16 DIAGNOSIS — H8110 Benign paroxysmal vertigo, unspecified ear: Secondary | ICD-10-CM | POA: Diagnosis not present

## 2017-12-16 DIAGNOSIS — T25221D Burn of second degree of right foot, subsequent encounter: Secondary | ICD-10-CM | POA: Diagnosis not present

## 2017-12-16 NOTE — Patient Outreach (Signed)
[facility]) Care Management  12/16/2017  [name] 1933-02-28 [id number]   Patient referred to this social worker by the [facility] to assist patient with questions related to her Advanced Directive. Patient stated that she had the document, however it had not been signed yet. Patient stated that she did not have any further questions regarding completing the document and denied having any additional community resource needs.   Plan: Patient to be closed to [facility] social work at this time. RNCM tone notified of case closure.   Adriana Reams [facility] Care Management [phone]

## 2017-12-16 NOTE — Telephone Encounter (Signed)
Tried to call Marcelino Duster back # left is not correct #. It states not is service.Marland KitchenRaechel Chute

## 2017-12-16 NOTE — Telephone Encounter (Signed)
Okay. Thank you.

## 2017-12-16 NOTE — Telephone Encounter (Signed)
The pt can use over-the-counter  "cold" medicines  such as "Afrin" nasal spray for nasal congestion as directed. Use " Delsym" or" Robitussin" cough syrup varietis for cough, plain "Tylenol" or "Advil" for fever, chills and achyness. Use Halls or Ricola cough drops.   "Common cold" symptoms are usually triggered by a virus.  The antibiotics are usually not necessary. On average, a" viral cold" illness would take 4-7 days to resolve.   Please, make an appointment if you are not better or if you're worse.  thx
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
## 2017-12-17 DIAGNOSIS — E1140 Type 2 diabetes mellitus with diabetic neuropathy, unspecified: Secondary | ICD-10-CM | POA: Diagnosis not present

## 2017-12-17 DIAGNOSIS — X118XXD Contact with other hot tap-water, subsequent encounter: Secondary | ICD-10-CM | POA: Diagnosis not present

## 2017-12-17 DIAGNOSIS — I5032 Chronic diastolic (congestive) heart failure: Secondary | ICD-10-CM | POA: Diagnosis not present

## 2017-12-17 DIAGNOSIS — H8110 Benign paroxysmal vertigo, unspecified ear: Secondary | ICD-10-CM | POA: Diagnosis not present

## 2017-12-17 DIAGNOSIS — I110 Hypertensive heart disease with heart failure: Secondary | ICD-10-CM | POA: Diagnosis not present

## 2017-12-17 DIAGNOSIS — M353 Polymyalgia rheumatica: Secondary | ICD-10-CM | POA: Diagnosis not present

## 2017-12-17 DIAGNOSIS — T25232D Burn of second degree of left toe(s) (nail), subsequent encounter: Secondary | ICD-10-CM | POA: Diagnosis not present

## 2017-12-17 DIAGNOSIS — G40909 Epilepsy, unspecified, not intractable, without status epilepticus: Secondary | ICD-10-CM | POA: Diagnosis not present

## 2017-12-17 DIAGNOSIS — T25221D Burn of second degree of right foot, subsequent encounter: Secondary | ICD-10-CM | POA: Diagnosis not present

## 2017-12-17 NOTE — Telephone Encounter (Signed)
Notified David w/MD response.../lmb 

## 2017-12-17 NOTE — Telephone Encounter (Signed)
I spoke with patient today and information given about OTC meds that she could try as well as Halls or Ricola cough drops if needed. Patient voiced understanding and will try one of the OCT meds to see how she does.

## 2017-12-18 DIAGNOSIS — T25232D Burn of second degree of left toe(s) (nail), subsequent encounter: Secondary | ICD-10-CM | POA: Diagnosis not present

## 2017-12-18 DIAGNOSIS — G40909 Epilepsy, unspecified, not intractable, without status epilepticus: Secondary | ICD-10-CM | POA: Diagnosis not present

## 2017-12-18 DIAGNOSIS — X118XXD Contact with other hot tap-water, subsequent encounter: Secondary | ICD-10-CM | POA: Diagnosis not present

## 2017-12-18 DIAGNOSIS — M353 Polymyalgia rheumatica: Secondary | ICD-10-CM | POA: Diagnosis not present

## 2017-12-18 DIAGNOSIS — E1140 Type 2 diabetes mellitus with diabetic neuropathy, unspecified: Secondary | ICD-10-CM | POA: Diagnosis not present

## 2017-12-18 DIAGNOSIS — I5032 Chronic diastolic (congestive) heart failure: Secondary | ICD-10-CM | POA: Diagnosis not present

## 2017-12-18 DIAGNOSIS — I110 Hypertensive heart disease with heart failure: Secondary | ICD-10-CM | POA: Diagnosis not present

## 2017-12-18 DIAGNOSIS — H8110 Benign paroxysmal vertigo, unspecified ear: Secondary | ICD-10-CM | POA: Diagnosis not present

## 2017-12-18 DIAGNOSIS — T25221D Burn of second degree of right foot, subsequent encounter: Secondary | ICD-10-CM | POA: Diagnosis not present

## 2017-12-19 DIAGNOSIS — X118XXD Contact with other hot tap-water, subsequent encounter: Secondary | ICD-10-CM | POA: Diagnosis not present

## 2017-12-19 DIAGNOSIS — T25221D Burn of second degree of right foot, subsequent encounter: Secondary | ICD-10-CM | POA: Diagnosis not present

## 2017-12-19 DIAGNOSIS — H8110 Benign paroxysmal vertigo, unspecified ear: Secondary | ICD-10-CM | POA: Diagnosis not present

## 2017-12-19 DIAGNOSIS — T25232D Burn of second degree of left toe(s) (nail), subsequent encounter: Secondary | ICD-10-CM | POA: Diagnosis not present

## 2017-12-19 DIAGNOSIS — I110 Hypertensive heart disease with heart failure: Secondary | ICD-10-CM | POA: Diagnosis not present

## 2017-12-19 DIAGNOSIS — G40909 Epilepsy, unspecified, not intractable, without status epilepticus: Secondary | ICD-10-CM | POA: Diagnosis not present

## 2017-12-19 DIAGNOSIS — E1140 Type 2 diabetes mellitus with diabetic neuropathy, unspecified: Secondary | ICD-10-CM | POA: Diagnosis not present

## 2017-12-19 DIAGNOSIS — M353 Polymyalgia rheumatica: Secondary | ICD-10-CM | POA: Diagnosis not present

## 2017-12-19 DIAGNOSIS — I5032 Chronic diastolic (congestive) heart failure: Secondary | ICD-10-CM | POA: Diagnosis not present

## 2017-12-20 DIAGNOSIS — I5032 Chronic diastolic (congestive) heart failure: Secondary | ICD-10-CM | POA: Diagnosis not present

## 2017-12-20 DIAGNOSIS — I110 Hypertensive heart disease with heart failure: Secondary | ICD-10-CM | POA: Diagnosis not present

## 2017-12-20 DIAGNOSIS — T25232D Burn of second degree of left toe(s) (nail), subsequent encounter: Secondary | ICD-10-CM | POA: Diagnosis not present

## 2017-12-20 DIAGNOSIS — H8110 Benign paroxysmal vertigo, unspecified ear: Secondary | ICD-10-CM | POA: Diagnosis not present

## 2017-12-20 DIAGNOSIS — E1140 Type 2 diabetes mellitus with diabetic neuropathy, unspecified: Secondary | ICD-10-CM | POA: Diagnosis not present

## 2017-12-20 DIAGNOSIS — T25221D Burn of second degree of right foot, subsequent encounter: Secondary | ICD-10-CM | POA: Diagnosis not present

## 2017-12-20 DIAGNOSIS — X118XXD Contact with other hot tap-water, subsequent encounter: Secondary | ICD-10-CM | POA: Diagnosis not present

## 2017-12-20 DIAGNOSIS — G40909 Epilepsy, unspecified, not intractable, without status epilepticus: Secondary | ICD-10-CM | POA: Diagnosis not present

## 2017-12-20 DIAGNOSIS — M353 Polymyalgia rheumatica: Secondary | ICD-10-CM | POA: Diagnosis not present

## 2017-12-23 ENCOUNTER — Other Ambulatory Visit: Payer: Self-pay | Admitting: *Deleted

## 2017-12-23 DIAGNOSIS — I5032 Chronic diastolic (congestive) heart failure: Secondary | ICD-10-CM | POA: Diagnosis not present

## 2017-12-23 DIAGNOSIS — Z9181 History of falling: Secondary | ICD-10-CM | POA: Diagnosis not present

## 2017-12-23 DIAGNOSIS — I110 Hypertensive heart disease with heart failure: Secondary | ICD-10-CM | POA: Diagnosis not present

## 2017-12-23 DIAGNOSIS — G40909 Epilepsy, unspecified, not intractable, without status epilepticus: Secondary | ICD-10-CM | POA: Diagnosis not present

## 2017-12-23 DIAGNOSIS — X118XXD Contact with other hot tap-water, subsequent encounter: Secondary | ICD-10-CM | POA: Diagnosis not present

## 2017-12-23 DIAGNOSIS — Z7952 Long term (current) use of systemic steroids: Secondary | ICD-10-CM | POA: Diagnosis not present

## 2017-12-23 DIAGNOSIS — H8110 Benign paroxysmal vertigo, unspecified ear: Secondary | ICD-10-CM | POA: Diagnosis not present

## 2017-12-23 DIAGNOSIS — Z8673 Personal history of transient ischemic attack (TIA), and cerebral infarction without residual deficits: Secondary | ICD-10-CM

## 2017-12-23 DIAGNOSIS — E1140 Type 2 diabetes mellitus with diabetic neuropathy, unspecified: Secondary | ICD-10-CM | POA: Diagnosis not present

## 2017-12-23 DIAGNOSIS — Z7982 Long term (current) use of aspirin: Secondary | ICD-10-CM | POA: Diagnosis not present

## 2017-12-23 DIAGNOSIS — M353 Polymyalgia rheumatica: Secondary | ICD-10-CM | POA: Diagnosis not present

## 2017-12-23 DIAGNOSIS — T25221D Burn of second degree of right foot, subsequent encounter: Secondary | ICD-10-CM | POA: Diagnosis not present

## 2017-12-23 DIAGNOSIS — T25232D Burn of second degree of left toe(s) (nail), subsequent encounter: Secondary | ICD-10-CM | POA: Diagnosis not present
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
## 2017-12-25 ENCOUNTER — Encounter: Payer: Self-pay | Admitting: *Deleted

## 2017-12-26 ENCOUNTER — Ambulatory Visit (INDEPENDENT_AMBULATORY_CARE_PROVIDER_SITE_OTHER): Payer: Medicare HMO | Admitting: Internal Medicine

## 2017-12-26 ENCOUNTER — Encounter: Payer: Self-pay | Admitting: Internal Medicine

## 2017-12-26 ENCOUNTER — Other Ambulatory Visit (INDEPENDENT_AMBULATORY_CARE_PROVIDER_SITE_OTHER): Payer: Medicare HMO

## 2017-12-26 DIAGNOSIS — I5031 Acute diastolic (congestive) heart failure: Secondary | ICD-10-CM | POA: Diagnosis not present

## 2017-12-26 DIAGNOSIS — N19 Unspecified kidney failure: Secondary | ICD-10-CM | POA: Diagnosis not present

## 2017-12-26 DIAGNOSIS — M353 Polymyalgia rheumatica: Secondary | ICD-10-CM

## 2017-12-26 DIAGNOSIS — I129 Hypertensive chronic kidney disease with stage 1 through stage 4 chronic kidney disease, or unspecified chronic kidney disease: Secondary | ICD-10-CM | POA: Diagnosis not present

## 2017-12-26 DIAGNOSIS — T25229D Burn of second degree of unspecified foot, subsequent encounter: Secondary | ICD-10-CM | POA: Diagnosis not present

## 2017-12-26 DIAGNOSIS — E1121 Type 2 diabetes mellitus with diabetic nephropathy: Secondary | ICD-10-CM

## 2017-12-26 DIAGNOSIS — I130 Hypertensive heart and chronic kidney disease with heart failure and stage 1 through stage 4 chronic kidney disease, or unspecified chronic kidney disease: Secondary | ICD-10-CM

## 2017-12-26 LAB — HEMOGLOBIN A1C: Hgb A1c MFr Bld: 6.8 % — ABNORMAL HIGH (ref 4.6–6.5)

## 2017-12-26 LAB — BASIC METABOLIC PANEL WITH GFR
BUN: 30 mg/dL — ABNORMAL HIGH (ref 6–23)
CO2: 25 meq/L (ref 19–32)
Calcium: 9.5 mg/dL (ref 8.4–10.5)
Chloride: 107 meq/L (ref 96–112)
Creatinine, Ser: 1.12 mg/dL (ref 0.40–1.20)
GFR: 59.79 mL/min — ABNORMAL LOW
Glucose, Bld: 91 mg/dL (ref 70–99)
Potassium: 5.5 meq/L — ABNORMAL HIGH (ref 3.5–5.1)
Sodium: 139 meq/L (ref 135–145)

## 2017-12-26 LAB — TSH: TSH: 2.89 u[IU]/mL (ref 0.35–4.50)

## 2017-12-26 MED ORDER — METHYLPREDNISOLONE ACETATE 80 MG/ML IJ SUSP
80.0000 mg | Freq: Once | INTRAMUSCULAR | Status: AC
  Administered 2017-12-26: 80 mg via INTRAMUSCULAR

## 2017-12-26 NOTE — Assessment & Plan Note (Addendum)
BP Readings from Last 3 Encounters:  12/26/17 132/84  11/21/17 (!) 155/62  08/05/17 140/86  Coreg,  [place] - d/c

## 2017-12-26 NOTE — Progress Notes (Signed)
Subjective:  Patient ID: [name], female    DOB: 09/22/1933  Age: 84 y.o. MRN: [id number]  CC: No chief complaint on file.   HPI [name] presents for PMR, B12 def, HTN f/u C/o fatigue/CFS - no change C/o not taking meds like she is supposed; not eating right Dtr is putting meds in the mediset now F/u on burns  Outpatient Medications Prior to Visit  Medication Sig Dispense Refill  . acetaminophen (TYLENOL) 500 MG tablet Take 500 mg by mouth 2 (two) times daily as needed.    . Alcohol Swabs (B-D SINGLE USE SWABS REGULAR) PADS Use to clean area to check blood sugars twice a day 300 each 1  . aspirin EC 325 MG tablet Take 325 mg by mouth daily.    . Blood Glucose Calibration (ACCU-CHEK AVIVA) SOLN Use as directed 3 each 1  . carvedilol (COREG) 25 MG tablet Take 1 tablet (25 mg total) by mouth 2 (two) times daily with a meal. 180 tablet 1  . cholecalciferol (VITAMIN D) 1000 units tablet Take 1,000 Units by mouth daily.    [name] glucose blood (ONETOUCH VERIO) test strip Use as instructed 300 each 3  . levETIRAcetam (KEPPRA) 500 MG tablet Take 1 tablet (500 mg total) by mouth 2 (two) times daily. 180 tablet 1  . meclizine (ANTIVERT) 25 MG tablet Take 0.5-1 tablets (12.5-25 mg total) by mouth 3 (three) times daily as needed for dizziness. 180 tablet 0  . ONETOUCH DELICA LANCETS FINE MISC 1 Device by Does not apply route daily as needed. 100 each 3  . predniSONE (DELTASONE) 5 MG tablet Take 1 tablet (5 mg total) by mouth daily with breakfast. 90 tablet 1  . vitamin B-12 (CYANOCOBALAMIN) 1000 MCG tablet Take 1,000 mcg by mouth daily.    [name] acetaminophen-codeine (TYLENOL #3) 300-30 MG tablet Take 1 tablet by mouth every 4 (four) hours as needed (FOR UP TO 5 DAYS FOR PAIN).      No facility-administered medications prior to visit.     ROS Review of Systems  Constitutional: Positive for fatigue and unexpected weight change. Negative for activity change, appetite change and chills.    HENT: Negative for congestion, mouth sores and sinus pressure.   Eyes: Negative for visual disturbance.  Respiratory: Negative for cough and chest tightness.   Gastrointestinal: Negative for abdominal pain and nausea.  Genitourinary: Negative for difficulty urinating, frequency and vaginal pain.  Musculoskeletal: Positive for arthralgias, back pain, gait problem, myalgias and neck stiffness.  Skin: Negative for pallor and rash.  Neurological: Positive for weakness. Negative for dizziness, tremors, numbness and headaches.  Psychiatric/Behavioral: Positive for decreased concentration and dysphoric mood. Negative for confusion, sleep disturbance and suicidal ideas. The patient is nervous/anxious.     Objective:  BP 132/84 (BP Location: Left Arm, Patient Position: Sitting, Cuff Size: Large)   Pulse 62   Temp 98 F (36.7 C) (Oral)   Ht 5\' 3"  (1.6 m)   Wt 195 lb (88.5 kg)   SpO2 99%   BMI 34.54 kg/m   BP Readings from Last 3 Encounters:  12/26/17 132/84  11/21/17 (!) 155/62  08/05/17 140/86    Wt Readings from Last 3 Encounters:  12/26/17 195 lb (88.5 kg)  11/19/17 192 lb (87.1 kg)  08/05/17 178 lb (80.7 kg)    Physical Exam  Constitutional: She appears well-developed. No distress.  HENT:  Head: Normocephalic.  Right Ear: External ear normal.  Left Ear: External ear normal.  Nose: Nose normal.  Mouth/Throat: Oropharynx is clear and moist.  Eyes: Conjunctivae are normal. Pupils are equal, round, and reactive to light. Right eye exhibits no discharge. Left eye exhibits no discharge.  Neck: Normal range of motion. Neck supple. No JVD present. No tracheal deviation present. No thyromegaly present.  Cardiovascular: Normal rate, regular rhythm and normal heart sounds.  Pulmonary/Chest: No stridor. No respiratory distress. She has no wheezes.  Abdominal: Soft. Bowel sounds are normal. She exhibits no distension and no mass. There is no tenderness. There is no rebound and no  guarding.  Musculoskeletal: She exhibits tenderness. She exhibits no edema.  Lymphadenopathy:    She has no cervical adenopathy.  Neurological: She displays normal reflexes. No cranial nerve deficit. She exhibits normal muscle tone. Coordination normal.  Skin: No rash noted. No erythema.  Psychiatric: She has a normal mood and affect. Her behavior is normal. Judgment and thought content normal.  obese Walker Burns on feet are healing Lab Results  Component Value Date   WBC 4.5 11/20/2017   HGB 9.7 (L) 11/20/2017   HCT 29.9 (L) 11/20/2017   PLT 179 11/20/2017   GLUCOSE 77 11/21/2017   CHOL 169 06/25/2015   TRIG 61 06/25/2015   HDL 57 06/25/2015   LDLDIRECT 114.8 01/10/2011   LDLCALC 100 (H) 06/25/2015   ALT 16 11/18/2017   AST 26 11/18/2017   NA 137 11/21/2017   K 4.7 11/21/2017   CL 116 (H) 11/21/2017   CREATININE 0.94 11/21/2017   BUN 25 (H) 11/21/2017   CO2 15 (L) 11/21/2017   TSH 1.44 06/28/2017   INR 1.09 07/17/2016   HGBA1C 7.3 (H) 04/23/2017    Dg Chest 2 View  Result Date: 11/18/2017 CLINICAL DATA:  Dizziness and nausea. EXAM: CHEST  2 VIEW COMPARISON:  10/27/2016 FINDINGS: The cardiac silhouette, mediastinal and hilar contours are within normal limits and stable. There is tortuosity and calcification of the thoracic aorta. The lungs are clear. No pleural effusion. The bony thorax is intact. IMPRESSION: No acute cardiopulmonary findings. Electronically Signed   By: Rudie Meyer M.D.   On: 11/18/2017 19:07    Assessment & Plan:   There are no diagnoses linked to this encounter. I have discontinued [name]. [name]'s acetaminophen-codeine. I am also having her maintain her vitamin B-12, aspirin EC, cholecalciferol, ONETOUCH DELICA LANCETS FINE, meclizine, ACCU-CHEK AVIVA, carvedilol, levETIRAcetam, predniSONE, B-D SINGLE USE SWABS REGULAR, glucose blood, and acetaminophen.  No orders of the defined types were placed in this encounter.    Follow-up: No Follow-up  on file.  [name], MD

## 2017-12-26 NOTE — Assessment & Plan Note (Signed)
Healing

## 2017-12-26 NOTE — Assessment & Plan Note (Addendum)
Low dose steroids Risks associated with treatment noncompliance were discussed. Compliance was encouraged. Depo-medrol IM today

## 2017-12-26 NOTE — Addendum Note (Signed)
Addended by: Scarlett Presto on: 12/26/2017 11:04 AM   Modules accepted: Orders

## 2017-12-26 NOTE — Assessment & Plan Note (Signed)
On diet Labs 

## 2017-12-26 NOTE — Assessment & Plan Note (Signed)
Coreg Risks associated with treatment noncompliance were discussed. Compliance was encouraged. 

## 2017-12-27 ENCOUNTER — Other Ambulatory Visit: Payer: Self-pay | Admitting: *Deleted

## 2017-12-27 DIAGNOSIS — T25221D Burn of second degree of right foot, subsequent encounter: Secondary | ICD-10-CM | POA: Diagnosis not present

## 2017-12-27 DIAGNOSIS — M353 Polymyalgia rheumatica: Secondary | ICD-10-CM | POA: Diagnosis not present

## 2017-12-27 DIAGNOSIS — I110 Hypertensive heart disease with heart failure: Secondary | ICD-10-CM | POA: Diagnosis not present

## 2017-12-27 DIAGNOSIS — G40909 Epilepsy, unspecified, not intractable, without status epilepticus: Secondary | ICD-10-CM | POA: Diagnosis not present

## 2017-12-27 DIAGNOSIS — E1140 Type 2 diabetes mellitus with diabetic neuropathy, unspecified: Secondary | ICD-10-CM | POA: Diagnosis not present

## 2017-12-27 DIAGNOSIS — T25232D Burn of second degree of left toe(s) (nail), subsequent encounter: Secondary | ICD-10-CM | POA: Diagnosis not present

## 2017-12-27 DIAGNOSIS — X118XXD Contact with other hot tap-water, subsequent encounter: Secondary | ICD-10-CM | POA: Diagnosis not present

## 2017-12-27 DIAGNOSIS — H8110 Benign paroxysmal vertigo, unspecified ear: Secondary | ICD-10-CM | POA: Diagnosis not present

## 2017-12-27 DIAGNOSIS — I5032 Chronic diastolic (congestive) heart failure: Secondary | ICD-10-CM | POA: Diagnosis not present

## 2017-12-27 NOTE — Patient Outreach (Signed)
[facility]) Care Management  12/27/2017  PAISLIE TESSLER [birth year] [id number]   CSW made an initial attempt to try and contact patient today to perform phone assessment, as well as assess and assist with social needs and services, without success.  A HIPAA compliant message was left for patient on voicemail.  CSW is currently awaiting a return call. CSW will make a second outreach attempt on Monday, December 30, 2017, if CSW does not receive a return call from patient in the meantime. Danford Bad, BSW, MSW, LCSW  Licensed Restaurant manager, fast food Health System  Mailing [place] [address], [place], [place] [address] Physical Address-[address], [place], [place] [address] Toll Free Main # [phone] Fax # [phone] Cell # [phone]  Office # [phone] Mardene [email or web address]

## 2017-12-28 DIAGNOSIS — E1140 Type 2 diabetes mellitus with diabetic neuropathy, unspecified: Secondary | ICD-10-CM | POA: Diagnosis not present

## 2017-12-28 DIAGNOSIS — I110 Hypertensive heart disease with heart failure: Secondary | ICD-10-CM | POA: Diagnosis not present

## 2017-12-28 DIAGNOSIS — G40909 Epilepsy, unspecified, not intractable, without status epilepticus: Secondary | ICD-10-CM | POA: Diagnosis not present

## 2017-12-28 DIAGNOSIS — T25232D Burn of second degree of left toe(s) (nail), subsequent encounter: Secondary | ICD-10-CM | POA: Diagnosis not present

## 2017-12-28 DIAGNOSIS — M353 Polymyalgia rheumatica: Secondary | ICD-10-CM | POA: Diagnosis not present

## 2017-12-28 DIAGNOSIS — T25221D Burn of second degree of right foot, subsequent encounter: Secondary | ICD-10-CM | POA: Diagnosis not present

## 2017-12-28 DIAGNOSIS — I5032 Chronic diastolic (congestive) heart failure: Secondary | ICD-10-CM | POA: Diagnosis not present

## 2017-12-28 DIAGNOSIS — X118XXD Contact with other hot tap-water, subsequent encounter: Secondary | ICD-10-CM | POA: Diagnosis not present

## 2017-12-28 DIAGNOSIS — H8110 Benign paroxysmal vertigo, unspecified ear: Secondary | ICD-10-CM | POA: Diagnosis not present

## 2017-12-30 ENCOUNTER — Encounter: Payer: Self-pay | Admitting: *Deleted

## 2017-12-30 ENCOUNTER — Other Ambulatory Visit: Payer: Self-pay | Admitting: *Deleted

## 2017-12-30 DIAGNOSIS — T25221D Burn of second degree of right foot, subsequent encounter: Secondary | ICD-10-CM | POA: Diagnosis not present

## 2017-12-30 DIAGNOSIS — X118XXD Contact with other hot tap-water, subsequent encounter: Secondary | ICD-10-CM | POA: Diagnosis not present

## 2017-12-30 DIAGNOSIS — M353 Polymyalgia rheumatica: Secondary | ICD-10-CM | POA: Diagnosis not present

## 2017-12-30 DIAGNOSIS — G40909 Epilepsy, unspecified, not intractable, without status epilepticus: Secondary | ICD-10-CM | POA: Diagnosis not present

## 2017-12-30 DIAGNOSIS — T25232D Burn of second degree of left toe(s) (nail), subsequent encounter: Secondary | ICD-10-CM | POA: Diagnosis not present

## 2017-12-30 DIAGNOSIS — E1140 Type 2 diabetes mellitus with diabetic neuropathy, unspecified: Secondary | ICD-10-CM | POA: Diagnosis not present

## 2017-12-30 DIAGNOSIS — H8110 Benign paroxysmal vertigo, unspecified ear: Secondary | ICD-10-CM | POA: Diagnosis not present

## 2017-12-30 DIAGNOSIS — I110 Hypertensive heart disease with heart failure: Secondary | ICD-10-CM | POA: Diagnosis not present

## 2017-12-30 DIAGNOSIS — I5032 Chronic diastolic (congestive) heart failure: Secondary | ICD-10-CM | POA: Diagnosis not present

## 2017-12-30 NOTE — Patient Outreach (Signed)
[facility]) Care Management  12/30/2017  [name] June 07, 1933 [id number]   CSW was able to make initial contact with patient today to perform phone assessment, as well as assess and assist with social work needs and services.  CSW introduced self, explained role and types of services provided through [place] [facility] ([employer]).  CSW further explained to patient that CSW works with patient's RNCM, also with [place] [employer], [name]. CSW then explained the reason for the call, indicating that Mrs. [name] thought that patient would benefit from social work services and resources to assist with transportation to and from her physician appointments.  CSW obtained two HIPAA compliant identifiers from patient, which included patient's name and date of birth. Patient reported that one of her daughters is currently transporting her to and from her physician appointments, but that she would like to have alternate transportation arrangements in place, in the event that one of them is not available to transport her.  CSW spoke with patient at length about [employer], which patient is eligible to receive because she is a Psychologist, prison and probation services recipient.  CSW explained to patient that she is eligible to receive 12 free rides per fiscal year, just by calling the number provided to her by CSW and giving them the specifics of her transportation requests (I.e. Time, date, address, nature of the appointments, etc.).  CSW agreed to contact a representative with [employer] to provide them with patient's basic demographic information to get her set up in their database.  Patient was happy to know that this resource is available to her, in the event that she needs to use it. CSW also spoke with patient about transportation services through [employer] with [employer] [place].  CSW explained that [employer] is a  Designer, fashion/clothing, free of charge to the recipient, as long as there is a Materials engineer available to transport.  CSW explained that [employer] likes to know at least a week in advance if transportation is needed so that a driver can be assigned.  CSW agreed to contact a representative with [employer] to get patient set up in their database, just by providing basic demographic information.  Patient reported that she will probably still rely on her daughters as much as possible, but that it is nice to know that there are transportation resources available to her, in the event that one of them is unavailable.  CSW will perform a case closure on patient, as all goals of treatment have been met from social work standpoint and no additional social work needs have been identified at this time.  CSW will notify patient's RNCM with [facility], of CSW's plans to close patient's case.  CSW will fax an update to patient's Primary Care Physician, Dr. Tyrone Apple Plotnikov to ensure that they are aware of CSW's involvement with patient's plan of care.  CSW will submit a case closure request to [name], Care Management Assistant with [facility], in the form of an [employer].  CSW will ensure that [name] is aware of [name]'s, RNCM with [place] [facility], continued involvement with patient's care. [name], [name], [name], [name]  Licensed Education officer, environmental Health System  Mailing [place] [address], [place], [place] [address] Physical Address-[address] [place], [place] [address] Toll Free Main # [phone] Fax #  [phone] Cell # [phone]  Office # [phone] [name]

## 2018-01-01 ENCOUNTER — Telehealth: Payer: Self-pay | Admitting: Internal Medicine

## 2018-01-01 DIAGNOSIS — I110 Hypertensive heart disease with heart failure: Secondary | ICD-10-CM | POA: Diagnosis not present

## 2018-01-01 DIAGNOSIS — I5032 Chronic diastolic (congestive) heart failure: Secondary | ICD-10-CM | POA: Diagnosis not present

## 2018-01-01 DIAGNOSIS — E1140 Type 2 diabetes mellitus with diabetic neuropathy, unspecified: Secondary | ICD-10-CM | POA: Diagnosis not present

## 2018-01-01 DIAGNOSIS — M353 Polymyalgia rheumatica: Secondary | ICD-10-CM | POA: Diagnosis not present

## 2018-01-01 DIAGNOSIS — T25221D Burn of second degree of right foot, subsequent encounter: Secondary | ICD-10-CM | POA: Diagnosis not present

## 2018-01-01 DIAGNOSIS — H8110 Benign paroxysmal vertigo, unspecified ear: Secondary | ICD-10-CM | POA: Diagnosis not present

## 2018-01-01 DIAGNOSIS — T25232D Burn of second degree of left toe(s) (nail), subsequent encounter: Secondary | ICD-10-CM | POA: Diagnosis not present

## 2018-01-01 DIAGNOSIS — X118XXD Contact with other hot tap-water, subsequent encounter: Secondary | ICD-10-CM | POA: Diagnosis not present

## 2018-01-01 DIAGNOSIS — G40909 Epilepsy, unspecified, not intractable, without status epilepticus: Secondary | ICD-10-CM | POA: Diagnosis not present

## 2018-01-01 NOTE — Telephone Encounter (Signed)
Copied from CRM [id number]. Topic: Quick Communication - See Telephone Encounter >> Jan 01, 2018  9:06 AM Landry Mellow wrote: CRM for notification. See Telephone encounter for:   01/01/18. [name] from [facility] called - requesting verbal orders to put pt on hold this week, to resume next week for discharge for home health occupational therapy - making good progress and had achieved her goals.  Cb - [phone]

## 2018-01-01 NOTE — Telephone Encounter (Signed)
Routing to dr plotnikov, please advise, thanks 

## 2018-01-02 NOTE — Telephone Encounter (Signed)
Okay. Thank you, 

## 2018-01-03 NOTE — Telephone Encounter (Signed)
Advised michelle/[facility] of dr plotnikov's verbal orders

## 2018-01-06 DIAGNOSIS — G40909 Epilepsy, unspecified, not intractable, without status epilepticus: Secondary | ICD-10-CM | POA: Diagnosis not present

## 2018-01-06 DIAGNOSIS — I5032 Chronic diastolic (congestive) heart failure: Secondary | ICD-10-CM | POA: Diagnosis not present

## 2018-01-06 DIAGNOSIS — M353 Polymyalgia rheumatica: Secondary | ICD-10-CM | POA: Diagnosis not present

## 2018-01-06 DIAGNOSIS — T25232D Burn of second degree of left toe(s) (nail), subsequent encounter: Secondary | ICD-10-CM | POA: Diagnosis not present

## 2018-01-06 DIAGNOSIS — I110 Hypertensive heart disease with heart failure: Secondary | ICD-10-CM | POA: Diagnosis not present

## 2018-01-06 DIAGNOSIS — X118XXD Contact with other hot tap-water, subsequent encounter: Secondary | ICD-10-CM | POA: Diagnosis not present

## 2018-01-06 DIAGNOSIS — T25221D Burn of second degree of right foot, subsequent encounter: Secondary | ICD-10-CM | POA: Diagnosis not present

## 2018-01-06 DIAGNOSIS — E1140 Type 2 diabetes mellitus with diabetic neuropathy, unspecified: Secondary | ICD-10-CM | POA: Diagnosis not present

## 2018-01-06 DIAGNOSIS — H8110 Benign paroxysmal vertigo, unspecified ear: Secondary | ICD-10-CM | POA: Diagnosis not present

## 2018-01-07 DIAGNOSIS — H8110 Benign paroxysmal vertigo, unspecified ear: Secondary | ICD-10-CM | POA: Diagnosis not present

## 2018-01-07 DIAGNOSIS — X118XXD Contact with other hot tap-water, subsequent encounter: Secondary | ICD-10-CM | POA: Diagnosis not present

## 2018-01-07 DIAGNOSIS — G40909 Epilepsy, unspecified, not intractable, without status epilepticus: Secondary | ICD-10-CM | POA: Diagnosis not present

## 2018-01-07 DIAGNOSIS — I5032 Chronic diastolic (congestive) heart failure: Secondary | ICD-10-CM | POA: Diagnosis not present

## 2018-01-07 DIAGNOSIS — M353 Polymyalgia rheumatica: Secondary | ICD-10-CM | POA: Diagnosis not present

## 2018-01-07 DIAGNOSIS — E1140 Type 2 diabetes mellitus with diabetic neuropathy, unspecified: Secondary | ICD-10-CM | POA: Diagnosis not present

## 2018-01-07 DIAGNOSIS — T25221D Burn of second degree of right foot, subsequent encounter: Secondary | ICD-10-CM | POA: Diagnosis not present

## 2018-01-07 DIAGNOSIS — I110 Hypertensive heart disease with heart failure: Secondary | ICD-10-CM | POA: Diagnosis not present

## 2018-01-07 DIAGNOSIS — T25232D Burn of second degree of left toe(s) (nail), subsequent encounter: Secondary | ICD-10-CM | POA: Diagnosis not present

## 2018-01-08 DIAGNOSIS — H8110 Benign paroxysmal vertigo, unspecified ear: Secondary | ICD-10-CM | POA: Diagnosis not present

## 2018-01-08 DIAGNOSIS — T25221D Burn of second degree of right foot, subsequent encounter: Secondary | ICD-10-CM | POA: Diagnosis not present

## 2018-01-08 DIAGNOSIS — I110 Hypertensive heart disease with heart failure: Secondary | ICD-10-CM | POA: Diagnosis not present

## 2018-01-08 DIAGNOSIS — E1140 Type 2 diabetes mellitus with diabetic neuropathy, unspecified: Secondary | ICD-10-CM | POA: Diagnosis not present

## 2018-01-08 DIAGNOSIS — T25232D Burn of second degree of left toe(s) (nail), subsequent encounter: Secondary | ICD-10-CM | POA: Diagnosis not present

## 2018-01-08 DIAGNOSIS — G40909 Epilepsy, unspecified, not intractable, without status epilepticus: Secondary | ICD-10-CM | POA: Diagnosis not present

## 2018-01-08 DIAGNOSIS — X118XXD Contact with other hot tap-water, subsequent encounter: Secondary | ICD-10-CM | POA: Diagnosis not present

## 2018-01-08 DIAGNOSIS — I5032 Chronic diastolic (congestive) heart failure: Secondary | ICD-10-CM | POA: Diagnosis not present

## 2018-01-08 DIAGNOSIS — M353 Polymyalgia rheumatica: Secondary | ICD-10-CM | POA: Diagnosis not present

## 2018-01-13 DIAGNOSIS — X118XXD Contact with other hot tap-water, subsequent encounter: Secondary | ICD-10-CM | POA: Diagnosis not present

## 2018-01-13 DIAGNOSIS — M353 Polymyalgia rheumatica: Secondary | ICD-10-CM | POA: Diagnosis not present

## 2018-01-13 DIAGNOSIS — H8110 Benign paroxysmal vertigo, unspecified ear: Secondary | ICD-10-CM | POA: Diagnosis not present

## 2018-01-13 DIAGNOSIS — T25232D Burn of second degree of left toe(s) (nail), subsequent encounter: Secondary | ICD-10-CM | POA: Diagnosis not present

## 2018-01-13 DIAGNOSIS — E1140 Type 2 diabetes mellitus with diabetic neuropathy, unspecified: Secondary | ICD-10-CM | POA: Diagnosis not present

## 2018-01-13 DIAGNOSIS — T25221D Burn of second degree of right foot, subsequent encounter: Secondary | ICD-10-CM | POA: Diagnosis not present

## 2018-01-13 DIAGNOSIS — I110 Hypertensive heart disease with heart failure: Secondary | ICD-10-CM | POA: Diagnosis not present

## 2018-01-13 DIAGNOSIS — G40909 Epilepsy, unspecified, not intractable, without status epilepticus: Secondary | ICD-10-CM | POA: Diagnosis not present

## 2018-01-13 DIAGNOSIS — I5032 Chronic diastolic (congestive) heart failure: Secondary | ICD-10-CM | POA: Diagnosis not present

## 2018-01-15 DIAGNOSIS — I110 Hypertensive heart disease with heart failure: Secondary | ICD-10-CM | POA: Diagnosis not present

## 2018-01-15 DIAGNOSIS — X118XXD Contact with other hot tap-water, subsequent encounter: Secondary | ICD-10-CM | POA: Diagnosis not present

## 2018-01-15 DIAGNOSIS — G40909 Epilepsy, unspecified, not intractable, without status epilepticus: Secondary | ICD-10-CM | POA: Diagnosis not present

## 2018-01-15 DIAGNOSIS — M353 Polymyalgia rheumatica: Secondary | ICD-10-CM | POA: Diagnosis not present

## 2018-01-15 DIAGNOSIS — T25232D Burn of second degree of left toe(s) (nail), subsequent encounter: Secondary | ICD-10-CM | POA: Diagnosis not present

## 2018-01-15 DIAGNOSIS — E1140 Type 2 diabetes mellitus with diabetic neuropathy, unspecified: Secondary | ICD-10-CM | POA: Diagnosis not present

## 2018-01-15 DIAGNOSIS — H8110 Benign paroxysmal vertigo, unspecified ear: Secondary | ICD-10-CM | POA: Diagnosis not present

## 2018-01-15 DIAGNOSIS — T25221D Burn of second degree of right foot, subsequent encounter: Secondary | ICD-10-CM | POA: Diagnosis not present

## 2018-01-15 DIAGNOSIS — I5032 Chronic diastolic (congestive) heart failure: Secondary | ICD-10-CM | POA: Diagnosis not present

## 2018-01-29 ENCOUNTER — Telehealth: Payer: Self-pay | Admitting: Internal Medicine

## 2018-01-29 DIAGNOSIS — R350 Frequency of micturition: Secondary | ICD-10-CM

## 2018-01-29 NOTE — Telephone Encounter (Signed)
Copied from CRM [id number]. Topic: General - Other >> Jan 29, 2018  1:51 PM Gerrianne Scale wrote: Reason for CRM: pt stating that she think she has a UTI she has been urinating 4 times at night and during the day been going on for a month it has an odor also with no burning  need something right a way use the   [facility] [address] [place] - [address] [phone] (Phone) [phone] (Fax)   >> Jan 29, 2018  1:54 PM Gerrianne Scale wrote: Pharmacy  [facility] [address] [place] - [address] [phone] (Phone) [phone] (Fax)

## 2018-01-30 NOTE — Telephone Encounter (Signed)
Please advise or should pt be seen?

## 2018-01-31 NOTE — Addendum Note (Signed)
Addended by: Scarlett Presto on: 01/31/2018 04:42 PM   Modules accepted: Orders

## 2018-01-31 NOTE — Telephone Encounter (Signed)
Pt notified and will come in monday

## 2018-01-31 NOTE — Telephone Encounter (Signed)
Okay to bring a urine test for UA and culture Thank you

## 2018-03-13 ENCOUNTER — Other Ambulatory Visit: Payer: Self-pay | Admitting: Internal Medicine

## 2018-03-13 NOTE — Telephone Encounter (Signed)
Copied from CRM [id number]. Topic: Quick Communication - Rx Refill/Question >> Mar 13, 2018 12:06 PM Leafy Ro wrote: Medication: generic keppa 500 mg #60 for 30 day supply Has the patient contacted their pharmacy?  (Agent: If no, request that the patient contact the pharmacy for the refill.) Preferred Pharmacy (with phone number or street name):[facility]  [address] [phone]

## 2018-03-13 NOTE — Telephone Encounter (Signed)
Keppra Last OV:12/26/17 Last filled:08/27/17 180 tab/1 refill NAT:[id number] Pharmacy: [facility] [address] [place] - [address] [phone] (Phone) [phone] (Fax)

## 2018-03-14 MED ORDER — LEVETIRACETAM 500 MG PO TABS: 500.0000 mg | ORAL_TABLET | Freq: Two times a day (BID) | ORAL | 0 refills | Status: DC

## 2018-03-14 MED ORDER — LEVETIRACETAM 500 MG PO TABS: 500.0000 mg | ORAL_TABLET | Freq: Two times a day (BID) | ORAL | 1 refills | Status: DC

## 2018-03-14 NOTE — Telephone Encounter (Signed)
Reviewed chart pt is up-to-date sent refills to both pharmacy../lmb . 

## 2018-03-19 ENCOUNTER — Ambulatory Visit: Payer: Medicare HMO | Admitting: Internal Medicine

## 2018-03-25 ENCOUNTER — Ambulatory Visit: Payer: Self-pay | Admitting: *Deleted

## 2018-03-25 ENCOUNTER — Telehealth: Payer: Self-pay

## 2018-03-25 ENCOUNTER — Ambulatory Visit: Payer: Medicare HMO | Admitting: Internal Medicine

## 2018-03-25 NOTE — Telephone Encounter (Signed)
I returned phone call to patient to see what she needed. Said she had spoken with you earlier today regarding her foot. She states she was speaking with you about appointment and doesn't think she can get here for tomorrow at 8:00 am. She doesn't seem to know when she can get here regarding appointment. She had requested you call her back but wasn't sure if there was anything you wanted me to do with her at this point. Please let me know if you want me to call her back to advise and if tomorrow's appointment needs to be cancelled out if she knows for sure she can't make it.  Thanks   Copied from CRM [id number]. Topic: General - Other >> Mar 25, 2018 11:53 AM Marylen Ponto wrote: Reason for CRM: Pt requested to speak with nurse Vernona Rieger. Pt requested a call back at [phone].

## 2018-03-25 NOTE — Telephone Encounter (Signed)
Please see note.

## 2018-03-25 NOTE — Telephone Encounter (Signed)
Is this something that can wait  until tomorrow with her swelling and the "painful and feels warm" to touch?

## 2018-03-25 NOTE — Telephone Encounter (Signed)
Pt has swelling in her feet and ankles. Painful and feels warm to touch. Also has some pain in both calves.  She denies having a fever. Has taken Tylenol for pain which did not help much. She is using a walker and able to walk some with it. She is having difficulty getting to her appointments, but did schedule an appointment. Have to check with her family to see if she can keep it.  Advised her that she needs to be seen. Pt voiced understanding.  Will route this to flow at  [facility] at [facility].  Reason for Disposition . [1] Very swollen joint AND [2] no fever  Answer Assessment - Initial Assessment Questions 1. LOCATION: "Which joint is swollen?"     Foot and ankle 2. ONSET: "When did the swelling start?"     2 or 3 weeks ago 3. SIZE: "How large is the swelling?"     Large from ankles and toes 4. PAIN: "Is there any pain?" If so, ask: "How bad is it?" (Scale 1-10; or mild, moderate, severe)     Pain # 9 5. CAUSE: "What do you think caused the swollen joint?"     Not sure 6. OTHER SYMPTOMS: "Do you have any other symptoms?" (e.g., fever, chest pain, difficulty breathing, calf pain)    Calf pain, warm to touch, some shortness of breath 7. PREGNANCY: "Is there any chance you are pregnant?" "When was your last menstrual period?"     no  Protocols used: ANKLE SWELLING-A-AH

## 2018-03-25 NOTE — Telephone Encounter (Signed)
Please disregard message. I have sent it to the correct Vernona Rieger.  Thanks

## 2018-03-26 ENCOUNTER — Emergency Department (HOSPITAL_COMMUNITY): Payer: Medicare HMO

## 2018-03-26 ENCOUNTER — Encounter (HOSPITAL_COMMUNITY): Payer: Self-pay | Admitting: Emergency Medicine

## 2018-03-26 ENCOUNTER — Ambulatory Visit (INDEPENDENT_AMBULATORY_CARE_PROVIDER_SITE_OTHER): Payer: Medicare HMO | Admitting: Family

## 2018-03-26 ENCOUNTER — Emergency Department (HOSPITAL_COMMUNITY)
Admission: EM | Admit: 2018-03-26 | Discharge: 2018-03-26 | Disposition: A | Discharge status: Home / Self Care | Payer: Medicare HMO | Attending: Emergency Medicine | Admitting: Emergency Medicine

## 2018-03-26 ENCOUNTER — Encounter: Payer: Self-pay | Admitting: Family

## 2018-03-26 VITALS — BP 142/88 | HR 61 | Temp 98.7°F | Ht 63.0 in | Wt 200.0 lb

## 2018-03-26 DIAGNOSIS — I509 Heart failure, unspecified: Secondary | ICD-10-CM

## 2018-03-26 DIAGNOSIS — Z79899 Other long term (current) drug therapy: Secondary | ICD-10-CM | POA: Insufficient documentation

## 2018-03-26 DIAGNOSIS — M25561 Pain in right knee: Secondary | ICD-10-CM | POA: Diagnosis not present

## 2018-03-26 DIAGNOSIS — Z7982 Long term (current) use of aspirin: Secondary | ICD-10-CM | POA: Diagnosis not present

## 2018-03-26 DIAGNOSIS — Z955 Presence of coronary angioplasty implant and graft: Secondary | ICD-10-CM | POA: Diagnosis not present

## 2018-03-26 DIAGNOSIS — M25562 Pain in left knee: Secondary | ICD-10-CM | POA: Insufficient documentation

## 2018-03-26 DIAGNOSIS — M7989 Other specified soft tissue disorders: Secondary | ICD-10-CM | POA: Diagnosis not present

## 2018-03-26 DIAGNOSIS — I5031 Acute diastolic (congestive) heart failure: Secondary | ICD-10-CM | POA: Insufficient documentation

## 2018-03-26 DIAGNOSIS — R2243 Localized swelling, mass and lump, lower limb, bilateral: Secondary | ICD-10-CM | POA: Diagnosis present

## 2018-03-26 DIAGNOSIS — I110 Hypertensive heart disease with heart failure: Secondary | ICD-10-CM | POA: Insufficient documentation

## 2018-03-26 DIAGNOSIS — I2510 Atherosclerotic heart disease of native coronary artery without angina pectoris: Secondary | ICD-10-CM | POA: Insufficient documentation

## 2018-03-26 DIAGNOSIS — E119 Type 2 diabetes mellitus without complications: Secondary | ICD-10-CM | POA: Diagnosis not present

## 2018-03-26 DIAGNOSIS — R079 Chest pain, unspecified: Secondary | ICD-10-CM | POA: Diagnosis not present

## 2018-03-26 DIAGNOSIS — R531 Weakness: Secondary | ICD-10-CM | POA: Diagnosis not present

## 2018-03-26 DIAGNOSIS — R0602 Shortness of breath: Secondary | ICD-10-CM | POA: Diagnosis not present

## 2018-03-26 LAB — CBC WITH DIFFERENTIAL/PLATELET
BASOS PCT: 0 %
Basophils Absolute: 0 10*3/uL (ref 0.0–0.1)
EOS ABS: 0.3 10*3/uL (ref 0.0–0.7)
Eosinophils Relative: 5 %
HCT: 30.6 % — ABNORMAL LOW (ref 36.0–46.0)
HEMOGLOBIN: 9.6 g/dL — AB (ref 12.0–15.0)
Lymphocytes Relative: 22 %
Lymphs Abs: 1.2 10*3/uL (ref 0.7–4.0)
MCH: 30.4 pg (ref 26.0–34.0)
MCHC: 31.4 g/dL (ref 30.0–36.0)
MCV: 96.8 fL (ref 78.0–100.0)
Monocytes Absolute: 0.4 10*3/uL (ref 0.1–1.0)
Monocytes Relative: 8 %
NEUTROS PCT: 65 %
Neutro Abs: 3.6 10*3/uL (ref 1.7–7.7)
Platelets: 204 10*3/uL (ref 150–400)
RBC: 3.16 MIL/uL — AB (ref 3.87–5.11)
RDW: 12.5 % (ref 11.5–15.5)
WBC: 5.5 10*3/uL (ref 4.0–10.5)

## 2018-03-26 LAB — BRAIN NATRIURETIC PEPTIDE: B Natriuretic Peptide: 380.4 pg/mL — ABNORMAL HIGH (ref 0.0–100.0)

## 2018-03-26 LAB — TROPONIN I

## 2018-03-26 LAB — COMPREHENSIVE METABOLIC PANEL
ALK PHOS: 61 U/L (ref 38–126)
ALT: 12 U/L — ABNORMAL LOW (ref 14–54)
ANION GAP: 9 (ref 5–15)
AST: 24 U/L (ref 15–41)
Albumin: 3.6 g/dL (ref 3.5–5.0)
BILIRUBIN TOTAL: 0.4 mg/dL (ref 0.3–1.2)
BUN: 26 mg/dL — ABNORMAL HIGH (ref 6–20)
CALCIUM: 9.3 mg/dL (ref 8.9–10.3)
CO2: 22 mmol/L (ref 22–32)
Chloride: 111 mmol/L (ref 101–111)
Creatinine, Ser: 1.13 mg/dL — ABNORMAL HIGH (ref 0.44–1.00)
GFR calc non Af Amer: 44 mL/min — ABNORMAL LOW (ref >60)
GFR, EST AFRICAN AMERICAN: 51 mL/min — AB (ref >60)
Glucose, Bld: 102 mg/dL — ABNORMAL HIGH (ref 65–99)
Potassium: 4.3 mmol/L (ref 3.5–5.1)
Sodium: 142 mmol/L (ref 135–145)
TOTAL PROTEIN: 7.5 g/dL (ref 6.5–8.1)

## 2018-03-26 MED ORDER — TRAMADOL HCL 50 MG PO TABS: 50.0000 mg | ORAL_TABLET | Freq: Four times a day (QID) | ORAL | 0 refills | Status: DC | PRN

## 2018-03-26 MED ORDER — FUROSEMIDE 10 MG/ML IJ SOLN
40.0000 mg | Freq: Once | INTRAMUSCULAR | Status: AC
  Administered 2018-03-26: 40 mg via INTRAVENOUS
  Filled 2018-03-26: qty 4

## 2018-03-26 MED ORDER — IOHEXOL 300 MG/ML  SOLN: 75.0000 mL | Freq: Once | INTRAMUSCULAR | Status: DC | PRN

## 2018-03-26 NOTE — ED Provider Notes (Signed)
[facility]-EMERGENCY DEPT Provider Note   CSN: [id number] Arrival date & time: 03/26/18  1191     History   Chief Complaint Chief Complaint  Patient presents with  . Leg Swelling    HPI [name] is a 84 y.o. female.  Patient sent in from primary care office with concerns for bilateral leg swelling and concern for congestive heart failure.  Patient has a history of diastolic congestive heart failure not on any diuretics.  Patient's complaint to me was bilateral leg swelling for 3 weeks.  Bilateral knee pain and bilateral foot pain.  Patient states she had chest pain yesterday lasting less than 10 minutes.  Had a little bit of chest pain today that also lasted less than 10 minutes.  Upon arrival patient's room air sats were 100%.  Patient states there is no acute change in her shortness of breath.  She always has some and that is been baseline for the last several weeks.     Past Medical History:  Diagnosis Date  . Anemia    iron deficiency  . Aneurysm, thoracic aortic (HCC)   . Anxiety   . B12 deficiency   . CAD (coronary artery disease)    s/p stenting of LAD 1999- cath 5-08 EF normal LAD 30-40% restenosis. D1 50% D2 80% LCX & RCA minimal plaque  . Chronic back pain   . Constipation   . Depression   . Diabetes mellitus   . DVT (deep venous thrombosis) (HCC)   . GERD (gastroesophageal reflux disease)   . Gout   . HTN (hypertension)   . Hyperlipemia   . Obesity   . Osteoporosis   . Pancreatitis   . Polyarthritis    DJD/ possible PMR  . Renal insufficiency    Cr 1.2-1.3  . Seizures (HCC)   . Tinnitus   . Urinary frequency   . Vertigo   . Vitamin D deficiency     Patient Active Problem List   Diagnosis Date Noted  . AKI (acute kidney injury) (HCC) 11/20/2017  . Dehydration   . Generalized weakness 11/18/2017  . Second degree burn of foot 11/18/2017  . Seizures (HCC) 07/17/2016  . Hypertensive urgency 07/17/2016  . Acute  diastolic CHF (congestive heart failure) (HCC) 07/17/2016  . UTI (urinary tract infection) 02/28/2016  . Urinary incontinence 10/28/2015  . Alopecia 08/12/2015  . TIA (transient ischemic attack) 07/01/2015  . Acute encephalopathy 06/24/2015  . Protein-calorie malnutrition, severe (HCC) 04/19/2015  . Fever   . Abdominal abscess   . Pressure ulcer 04/07/2015  . Acute kidney injury (HCC)   . Sepsis (HCC)   . Stroke (HCC)   . Facial droop   . Malnutrition of moderate degree (HCC) 03/20/2015  . Small bowel obstruction s/p exlap/LOA/decompression 03/25/2015 03/18/2015  . Essential hypertension 03/18/2015  . Noncompliance w/medication treatment due to intermit use of medication 12/07/2014  . Fall against object 06/24/2014  . Contusion of left hand 06/24/2014  . Contusion of left hip 06/24/2014  . Loss of weight 04/12/2014  . Malignant hypertension with renal failure and congestive heart failure (HCC) 09/23/2013  . Knee pain, bilateral 07/27/2013  . Chronic fatigue disorder 01/19/2013  . Vaginitis due to Candida 09/02/2012  . Sinusitis 09/02/2012  . Anemia in other chronic diseases classified elsewhere 09/02/2012  . Polymyalgia rheumatica (HCC) 09/10/2011  . Vertigo 08/21/2011  . Fatigue 05/11/2011  . ANEMIA OF CHRONIC DISEASE 09/06/2010  . KNEE PAIN, CHRONIC 05/26/2010  . DIZZINESS 05/11/2010  .  SHOULDER PAIN 04/25/2010  . FREQUENCY, URINARY 03/16/2010  . CONSTIPATION, CHRONIC 01/17/2010  . FOOT PAIN 01/17/2009  . RASH AND OTHER NONSPECIFIC SKIN ERUPTION 01/17/2009  . TINNITUS NOS 10/18/2008  . B12 deficiency 08/10/2008  . LOW BACK PAIN 06/23/2008  . CHEST WALL PAIN 06/23/2008  . DYSURIA 03/18/2008  . Abdominal pain 03/18/2008  . HYPERLIPIDEMIA 12/24/2007  . DYSPNEA 12/24/2007  . Anxiety state 12/07/2007  . GERD 12/07/2007  . CHEST PAIN 12/07/2007  . OTHER SPEC FORMS CHRONIC ISCHEMIC HEART DISEASE 12/01/2007  . Type 2 diabetes mellitus with diabetic nephropathy, without  long-term current use of insulin (HCC) 11/28/2007  . Pain in joint 11/28/2007  . Gout 09/17/2007  . Adjustment disorder with mixed anxiety and depressed mood 09/17/2007  . Coronary atherosclerosis 09/17/2007  . Disorder resulting from impaired renal function 09/17/2007  . OSTEOPOROSIS 09/17/2007  . DVT, HX OF 09/17/2007  . PANCREATITIS, HX OF 09/17/2007    Past Surgical History:  Procedure Laterality Date  . ABDOMINAL HYSTERECTOMY    . CARDIAC CATHETERIZATION  2008   L main 20%, LAD stent patent, D1 50%, D2 80% (small), RCA 20%, EF 55-60%  . CHOLECYSTECTOMY    . CORONARY ANGIOPLASTY WITH STENT PLACEMENT  1999   LAD stent  . HEMORRHOID SURGERY    . LAPAROSCOPIC LYSIS OF ADHESIONS N/A 03/24/2015   Procedure: LAPAROSCOPIC LYSIS OF ADHESIONS;  Surgeon: Luretha Murphy, MD;  Location: [facility];  Service: General;  Laterality: N/A;  . LAPAROSCOPY N/A 03/24/2015   Procedure: LAPAROSCOPY DIAGNOSTIC;  Surgeon: Luretha Murphy, MD;  Location: [facility];  Service: General;  Laterality: N/A;  . LAPAROTOMY N/A 03/24/2015   Procedure: LAPAROTOMY with decompression of bowel;  Surgeon: Luretha Murphy, MD;  Location: [facility];  Service: General;  Laterality: N/A;  . TUBAL LIGATION       OB History   None      Home Medications    Prior to Admission medications   Medication Sig Start Date End Date Taking? Authorizing Provider  acetaminophen (TYLENOL) 500 MG tablet Take 500 mg by mouth every 6 (six) hours as needed.    Yes [provider]  aspirin EC 325 MG tablet Take 325 mg by mouth daily.   Yes [provider]  carvedilol (COREG) 25 MG tablet Take 1 tablet (25 mg total) by mouth 2 (two) times daily with a meal. 08/27/17  Yes Plotnikov, [name], MD  cholecalciferol (VITAMIN D) 1000 units tablet Take 1,000 Units by mouth daily.   Yes [provider]  levETIRAcetam (KEPPRA) 500 MG tablet Take 1 tablet (500 mg total) by mouth 2 (two) times daily. 03/14/18  Yes Plotnikov, [name], MD  meclizine (ANTIVERT) 25 MG tablet Take 0.5-1 tablets (12.5-25 mg total) by mouth 3 (three) times daily as needed for dizziness. 12/26/16  Yes Plotnikov, [name], MD  vitamin B-12 (CYANOCOBALAMIN) 1000 MCG tablet Take 1,000 mcg by mouth daily.   Yes [provider]  Alcohol Swabs (B-D SINGLE USE SWABS REGULAR) PADS Use to clean area to check blood sugars twice a day 08/27/17   Plotnikov, [name], MD  Blood Glucose Calibration (ACCU-CHEK AVIVA) SOLN Use as directed 02/20/17   Plotnikov, [name], MD  glucose blood (ONETOUCH VERIO) test strip Use as instructed 08/27/17   Plotnikov, [name], MD  [facility] DELICA LANCETS FINE MISC 1 Device by Does not apply route daily as needed. 09/21/16   Plotnikov, [name], MD  predniSONE (DELTASONE) 5 MG tablet Take 1  tablet (5 mg total) by mouth daily with breakfast. Patient not taking: Reported on 03/26/2018 08/27/17   Plotnikov, [name], MD  olmesartan (BENICAR) 20 MG tablet Take 20 mg by mouth daily.  01/22/12  [provider]    Family History Family History  Adopted: Yes  Problem Relation Age of Onset  . Diabetes Mother   . Hypertension Father     Social History Social History   Tobacco Use  . Smoking status: Never Smoker  . Smokeless tobacco: Never Used  Substance Use Topics  . Alcohol use: No  . Drug use: No     Allergies   Ativan [lorazepam]; Tramadol hcl; Amlodipine besylate; Atenolol; Benazepril; Benicar [olmesartan medoxomil]; Cozaar; Hydralazine; Hydrochlorothiazide w-triamterene; Hydrocodone; Hydroxyzine pamoate; Iodine; Lisinopril; Peach flavor; Penicillins; Pravastatin; Prednisolone; Strawberry flavor; Tramadol hcl; and Benadryl [diphenhydramine]   Review of Systems Review of Systems  Constitutional: Negative for fever.  HENT: Negative for congestion.   Eyes: Negative for redness.  Respiratory: Negative for shortness of breath.   Cardiovascular: Positive for chest pain and leg swelling.    Gastrointestinal: Negative for abdominal pain, nausea and vomiting.  Genitourinary: Negative for dysuria.  Musculoskeletal: Positive for arthralgias.  Skin: Negative for rash.  Neurological: Negative for syncope.  Hematological: Does not bruise/bleed easily.  Psychiatric/Behavioral: Negative for confusion.     Physical Exam Updated Vital Signs BP (!) 172/86   Pulse (!) 58   Temp 98.2 F (36.8 C) (Oral)   Resp 18   Ht 1.626 m (5\' 4" )   Wt 90.7 kg (200 lb)   SpO2 100%   BMI 34.33 kg/m   Physical Exam  Constitutional: She is oriented to person, place, and time. She appears well-developed and well-nourished. No distress.  HENT:  Head: Normocephalic and atraumatic.  Mouth/Throat: Oropharynx is clear and moist.  Eyes: Pupils are equal, round, and reactive to light. Conjunctivae and EOM are normal.  Neck: Neck supple.  Cardiovascular: Normal rate, regular rhythm and normal heart sounds.  Pulmonary/Chest: Effort normal and breath sounds normal. No respiratory distress. She has no wheezes.  Abdominal: Bowel sounds are normal. There is no tenderness.  Musculoskeletal: Normal range of motion. She exhibits edema. She exhibits no tenderness.  Neurological: She is alert and oriented to person, place, and time. No cranial nerve deficit or sensory deficit. She exhibits normal muscle tone. Coordination normal.  Skin: Skin is warm.  Nursing note and vitals reviewed.    ED Treatments / Results  Labs (all labs ordered are listed, but only abnormal results are displayed) Labs Reviewed  COMPREHENSIVE METABOLIC PANEL - Abnormal; Notable for the following components:      Result Value   Glucose, Bld 102 (*)    BUN 26 (*)    Creatinine, Ser 1.13 (*)    ALT 12 (*)    GFR calc non Af Amer 44 (*)    GFR calc Af Amer 51 (*)    All other components within normal limits  BRAIN NATRIURETIC PEPTIDE - Abnormal; Notable for the following components:   B Natriuretic Peptide 380.4 (*)    All  other components within normal limits  CBC WITH DIFFERENTIAL/PLATELET - Abnormal; Notable for the following components:   RBC 3.16 (*)    Hemoglobin 9.6 (*)    HCT 30.6 (*)    All other components within normal limits  TROPONIN I    EKG EKG Interpretation  Date/Time:  Wednesday Mar 26 2018 11:45:23 EDT Ventricular Rate:  58 PR Interval:  QRS Duration: 131 QT Interval:  435 QTC Calculation: 428 R Axis:   60 Text Interpretation:  Sinus rhythm Right bundle branch block No significant change since last tracing Confirmed by Vanetta Mulders [phone]) on 03/26/2018 12:45:57 PM   Radiology Dg Chest 2 View  Result Date: 03/26/2018 CLINICAL DATA:  Weakness.  Bilateral lower extremity swelling. EXAM: CHEST - 2 VIEW COMPARISON:  11/18/2017 FINDINGS: There is moderate cardiac enlargement. Aortic atherosclerosis identified. No pleural effusion or edema identified. No airspace opacities. Spondylosis noted within the thoracic spine. IMPRESSION: 1. No acute cardiopulmonary abnormalities. 2. Cardiac enlargement 3.  Aortic Atherosclerosis (ICD10-I70.0). Electronically Signed   By: Signa Kell M.D.   On: 03/26/2018 11:58   Ct Chest Wo Contrast  Result Date: 03/26/2018 CLINICAL DATA:  Chronic shortness of breath, lower extremity swelling EXAM: CT CHEST WITHOUT CONTRAST TECHNIQUE: Multidetector CT imaging of the chest was performed following the standard protocol without IV contrast. COMPARISON:  Chest x-ray of 03/26/2018 FINDINGS: Cardiovascular: The heart is mildly enlarged. No pericardial effusion is seen. Diffuse coronary artery calcifications are noted. Moderate thoracic aortic atherosclerosis is present. The mid ascending thoracic aorta measures 34 mm in diameter Mediastinum/Nodes: No mediastinal or hilar adenopathy is seen. The thyroid gland is unremarkable. No hiatal hernia is seen. Lungs/Pleura: On lung window images, no pneumonia is seen. There is no present CT evidence of congestive heart  failure. No pleural effusion is seen and there is no fluid noted within the fissures. No prominent interstitial markings are evident other than mild chronic change at the lung bases with mild bronchiectatic change particularly in the right lower lobe posterior medially. Small pleural plaques are noted posteriorly on the left without calcification. The central airway is patent. Upper Abdomen: The gallbladder has previously been resected. Common bile duct is dilated but possibly normal post cholecystectomy. Correlate with LFTs. The pancreatic duct is not appear to be dilated. Musculoskeletal: The thoracic vertebrae are in normal alignment with no acute abnormality. There are diffuse degenerative changes throughout the thoracic spine. IMPRESSION: 1. Diffuse coronary artery calcifications and moderate thoracic aortic atherosclerosis. 2. No evidence of congestive heart failure by CT is seen. No pleural effusion is seen and no prominence of interstitial markings is noted. 3. Prominent common bile duct probably post cholecystectomy but correlate with LFTs. Electronically Signed   By: Dwyane Dee M.D.   On: 03/26/2018 14:54    Procedures Procedures (including critical care time)  Medications Ordered in ED Medications  iohexol (OMNIPAQUE) 300 MG/ML solution 75 mL (75 mLs Intravenous Canceled Entry 03/26/18 1415)  furosemide (LASIX) injection 40 mg (40 mg Intravenous Given 03/26/18 1331)     Initial Impression / Assessment and Plan / ED Course  I have reviewed the triage vital signs and the nursing notes.  Pertinent labs & imaging results that were available during my care of the patient were reviewed by me and considered in my medical decision making (see chart for details).     Patient with extensive work-up here for concerns for congestive heart failure.  Definitely has bilateral leg swelling.  Patient's BNP was elevated troponin was normal.  So the chest pain she had briefly yesterday and today probably  not significant.  CT of chest was done which showed no evidence of pulmonary edema or congestive heart failure.  As well as her chest x-ray was normal.  Patient given 40 mg of Lasix IV.  Did not seem to make any difference patient never hypoxic.  Room air sats have been very  high in the upper 90s.  Feel patient can be discharged home her BNP being elevated is concerning and will need some close follow-up.  We will leave continuing her on a diuretic up to her primary care doctor.  Feel that the knee pain and foot pain are more arthritic or arthralgic in nature and will treat with tramadol.  Patient has trouble with headaches developing from hydrocodone and tramadol but these are not true allergies.  Patient is willing to take the tramadol.  Final Clinical Impressions(s) / ED Diagnoses   Final diagnoses:  Acute pain of both knees  Leg swelling    ED Discharge Orders    None       Vanetta Mulders, MD 03/26/18 1715

## 2018-03-26 NOTE — Discharge Instructions (Addendum)
As we discussed extensive work-up for concerns for congestive heart failure.  But CT scan of the chest without evidence of any fluid overload.  You received 40 mg of Lasix here today to have bilateral leg swelling I think your pain in the knees and both feet may be somewhat arthritic in nature.  And recommend he take tramadol for that.  Recommend close follow-up with your primary care doctor to see if they want to continue you on a diuretic.  Your BNP was elevated but CT chest without evidence of any significant pulmonary edema or congestive heart failure.  Take the tramadol as needed for the knee pain and foot pain.

## 2018-03-26 NOTE — Progress Notes (Signed)
[name] is a 84 y.o. female with the following history as recorded in [employer]:  Patient Active Problem List   Diagnosis Date Noted  . AKI (acute kidney injury) (HCC) 11/20/2017  . Dehydration   . Generalized weakness 11/18/2017  . Second degree burn of foot 11/18/2017  . Seizures (HCC) 07/17/2016  . Hypertensive urgency 07/17/2016  . Acute diastolic CHF (congestive heart failure) (HCC) 07/17/2016  . UTI (urinary tract infection) 02/28/2016  . Urinary incontinence 10/28/2015  . Alopecia 08/12/2015  . TIA (transient ischemic attack) 07/01/2015  . Acute encephalopathy 06/24/2015  . Protein-calorie malnutrition, severe (HCC) 04/19/2015  . Fever   . Abdominal abscess   . Pressure ulcer 04/07/2015  . Acute kidney injury (HCC)   . Sepsis (HCC)   . Stroke (HCC)   . Facial droop   . Malnutrition of moderate degree (HCC) 03/20/2015  . Small bowel obstruction s/p exlap/LOA/decompression 03/25/2015 03/18/2015  . Essential hypertension 03/18/2015  . Noncompliance w/medication treatment due to intermit use of medication 12/07/2014  . Fall against object 06/24/2014  . Contusion of left hand 06/24/2014  . Contusion of left hip 06/24/2014  . Loss of weight 04/12/2014  . Malignant hypertension with renal failure and congestive heart failure (HCC) 09/23/2013  . Knee pain, bilateral 07/27/2013  . Chronic fatigue disorder 01/19/2013  . Vaginitis due to Candida 09/02/2012  . Sinusitis 09/02/2012  . Anemia in other chronic diseases classified elsewhere 09/02/2012  . Polymyalgia rheumatica (HCC) 09/10/2011  . Vertigo 08/21/2011  . Fatigue 05/11/2011  . ANEMIA OF CHRONIC DISEASE 09/06/2010  . KNEE PAIN, CHRONIC 05/26/2010  . DIZZINESS 05/11/2010  . SHOULDER PAIN 04/25/2010  . FREQUENCY, URINARY 03/16/2010  . CONSTIPATION, CHRONIC 01/17/2010  . FOOT PAIN 01/17/2009  . RASH AND OTHER NONSPECIFIC SKIN ERUPTION 01/17/2009  . TINNITUS NOS 10/18/2008  . B12 deficiency 08/10/2008  . LOW  BACK PAIN 06/23/2008  . CHEST WALL PAIN 06/23/2008  . DYSURIA 03/18/2008  . Abdominal pain 03/18/2008  . HYPERLIPIDEMIA 12/24/2007  . DYSPNEA 12/24/2007  . Anxiety state 12/07/2007  . GERD 12/07/2007  . CHEST PAIN 12/07/2007  . OTHER SPEC FORMS CHRONIC ISCHEMIC HEART DISEASE 12/01/2007  . Type 2 diabetes mellitus with diabetic nephropathy, without long-term current use of insulin (HCC) 11/28/2007  . Pain in joint 11/28/2007  . Gout 09/17/2007  . Adjustment disorder with mixed anxiety and depressed mood 09/17/2007  . Coronary atherosclerosis 09/17/2007  . Disorder resulting from impaired renal function 09/17/2007  . OSTEOPOROSIS 09/17/2007  . DVT, HX OF 09/17/2007  . PANCREATITIS, HX OF 09/17/2007    Current Outpatient Medications  Medication Sig Dispense Refill  . acetaminophen (TYLENOL) 500 MG tablet Take 500 mg by mouth 2 (two) times daily as needed.    . Alcohol Swabs (B-D SINGLE USE SWABS REGULAR) PADS Use to clean area to check blood sugars twice a day 300 each 1  . aspirin EC 325 MG tablet Take 325 mg by mouth daily.    . Blood Glucose Calibration (ACCU-CHEK AVIVA) SOLN Use as directed 3 each 1  . carvedilol (COREG) 25 MG tablet Take 1 tablet (25 mg total) by mouth 2 (two) times daily with a meal. 180 tablet 1  . cholecalciferol (VITAMIN D) 1000 units tablet Take 1,000 Units by mouth daily.    [name] glucose blood (ONETOUCH VERIO) test strip Use as instructed 300 each 3  . levETIRAcetam (KEPPRA) 500 MG tablet Take 1 tablet (500 mg total) by mouth 2 (two) times daily. 180 tablet  1  . meclizine (ANTIVERT) 25 MG tablet Take 0.5-1 tablets (12.5-25 mg total) by mouth 3 (three) times daily as needed for dizziness. 180 tablet 0  . ONETOUCH DELICA LANCETS FINE MISC 1 Device by Does not apply route daily as needed. 100 each 3  . vitamin B-12 (CYANOCOBALAMIN) 1000 MCG tablet Take 1,000 mcg by mouth daily.    . predniSONE (DELTASONE) 5 MG tablet Take 1 tablet (5 mg total) by mouth daily with  breakfast. (Patient not taking: Reported on 03/26/2018) 90 tablet 1   No current facility-administered medications for this visit.     Allergies: Ativan [lorazepam]; Tramadol hcl; Amlodipine besylate; Atenolol; Benazepril; Benicar [olmesartan medoxomil]; Cozaar; Hydralazine; Hydrochlorothiazide w-triamterene; Hydrocodone; Hydroxyzine pamoate; Iodine; Lisinopril; Peach flavor; Penicillins; Pravastatin; Prednisolone; Strawberry flavor; Tramadol hcl; and Benadryl [diphenhydramine]  Past Medical History:  Diagnosis Date  . Anemia    iron deficiency  . Aneurysm, thoracic aortic (HCC)   . Anxiety   . B12 deficiency   . CAD (coronary artery disease)    s/p stenting of LAD 1999- cath 5-08 EF normal LAD 30-40% restenosis. D1 50% D2 80% LCX & RCA minimal plaque  . Chronic back pain   . Constipation   . Depression   . Diabetes mellitus   . DVT (deep venous thrombosis) (HCC)   . GERD (gastroesophageal reflux disease)   . Gout   . HTN (hypertension)   . Hyperlipemia   . Obesity   . Osteoporosis   . Pancreatitis   . Polyarthritis    DJD/ possible PMR  . Renal insufficiency    Cr 1.2-1.3  . Seizures (HCC)   . Tinnitus   . Urinary frequency   . Vertigo   . Vitamin D deficiency     Past Surgical History:  Procedure Laterality Date  . ABDOMINAL HYSTERECTOMY    . CARDIAC CATHETERIZATION  2008   L main 20%, LAD stent patent, D1 50%, D2 80% (small), RCA 20%, EF 55-60%  . CHOLECYSTECTOMY    . CORONARY ANGIOPLASTY WITH STENT PLACEMENT  1999   LAD stent  . HEMORRHOID SURGERY    . LAPAROSCOPIC LYSIS OF ADHESIONS N/A 03/24/2015   Procedure: LAPAROSCOPIC LYSIS OF ADHESIONS;  Surgeon: Luretha Murphy, MD;  Location: [facility];  Service: General;  Laterality: N/A;  . LAPAROSCOPY N/A 03/24/2015   Procedure: LAPAROSCOPY DIAGNOSTIC;  Surgeon: Luretha Murphy, MD;  Location: [facility];  Service: General;  Laterality: N/A;  . LAPAROTOMY N/A 03/24/2015   Procedure: LAPAROTOMY with decompression of bowel;   Surgeon: Luretha Murphy, MD;  Location: [facility];  Service: General;  Laterality: N/A;  . TUBAL LIGATION      Family History  Adopted: Yes  Problem Relation Age of Onset  . Diabetes Mother   . Hypertension Father     Social History   Tobacco Use  . Smoking status: Never Smoker  . Smokeless tobacco: Never Used  Substance Use Topics  . Alcohol use: No    Subjective:  3 week history of bilateral ankle and leg swelling; accompanied by her daughter today; does have history of CHF; weight is up 5 pounds today compared to OV in 12/2017; notes that having some increased shortness of breath; notes that legs are painful to touch; patient feels like fluids is backing up into her knees; daughter has noticed that even her wrists and ankles are swelling more recently; patient states that she finally reached her "breaking point" with the fluid and decided to schedule visit today;   Objective:  Vitals:   03/26/18 0818  BP: (!) 142/88  Pulse: 61  Temp: 98.7 F (37.1 C)  TempSrc: Oral  SpO2: 99%  Weight: 200 lb (90.7 kg)  Height: 5\' 3"  (1.6 m)    General: Well developed, well nourished, in no acute distress  Skin : Warm and dry.  Head: Normocephalic and atraumatic  Lungs: Respirations unlabored; clear to auscultation bilaterally without wheeze, rales, rhonchi  CVS exam: normal rate and regular rhythm.  Extremities: bilateral pedal edema, cyanosis, clubbing; swelling is noted moving up into knees/ bilateral wrists Vessels: Symmetric bilaterally  Neurologic: Alert and oriented; speech intact; face symmetrical; moves all extremities well; CNII-XII intact without focal deficit  Assessment:  1. Acute on chronic congestive heart failure, unspecified heart failure type [facility])     Plan:  Recommend ER evaluation for IV Lasix/ probable admission; patient and her daughter in agreement; follow-up to be determined.   No follow-ups on file.  No orders of the defined types were placed in this encounter.    Requested Prescriptions    No prescriptions requested or ordered in this encounter

## 2018-03-26 NOTE — ED Triage Notes (Signed)
Pt having worsening swelling in legs and feet as well little around her worst that has gotten worse over past 3 weeks. PCP sent to Ed for IV diuretics.

## 2018-04-02 ENCOUNTER — Inpatient Hospital Stay: Payer: Medicare HMO | Admitting: Internal Medicine

## 2018-04-03 ENCOUNTER — Ambulatory Visit (INDEPENDENT_AMBULATORY_CARE_PROVIDER_SITE_OTHER): Payer: Medicare HMO | Admitting: Internal Medicine

## 2018-04-03 ENCOUNTER — Encounter: Payer: Self-pay | Admitting: Internal Medicine

## 2018-04-03 DIAGNOSIS — R5382 Chronic fatigue, unspecified: Secondary | ICD-10-CM

## 2018-04-03 DIAGNOSIS — I5030 Unspecified diastolic (congestive) heart failure: Secondary | ICD-10-CM

## 2018-04-03 DIAGNOSIS — G9332 Myalgic encephalomyelitis/chronic fatigue syndrome: Secondary | ICD-10-CM

## 2018-04-03 DIAGNOSIS — E1121 Type 2 diabetes mellitus with diabetic nephropathy: Secondary | ICD-10-CM | POA: Diagnosis not present

## 2018-04-03 DIAGNOSIS — D638 Anemia in other chronic diseases classified elsewhere: Secondary | ICD-10-CM | POA: Diagnosis not present

## 2018-04-03 DIAGNOSIS — E538 Deficiency of other specified B group vitamins: Secondary | ICD-10-CM

## 2018-04-03 DIAGNOSIS — Z9114 Patient's other noncompliance with medication regimen: Secondary | ICD-10-CM

## 2018-04-03 DIAGNOSIS — M353 Polymyalgia rheumatica: Secondary | ICD-10-CM | POA: Diagnosis not present

## 2018-04-03 DIAGNOSIS — I509 Heart failure, unspecified: Secondary | ICD-10-CM | POA: Insufficient documentation

## 2018-04-03 MED ORDER — FUROSEMIDE 20 MG PO TABS: 20.0000 mg | ORAL_TABLET | Freq: Every day | ORAL | 1 refills | Status: DC | PRN

## 2018-04-03 MED ORDER — CYANOCOBALAMIN 1000 MCG/ML IJ SOLN
1000.0000 ug | Freq: Once | INTRAMUSCULAR | Status: AC
  Administered 2018-04-03: 1000 ug via INTRAMUSCULAR

## 2018-04-03 MED ORDER — ASPIRIN 325 MG PO TABS: 325.0000 mg | ORAL_TABLET | Freq: Every day | ORAL | 3 refills | Status: DC

## 2018-04-03 MED ORDER — METHYLPREDNISOLONE ACETATE 80 MG/ML IJ SUSP
80.0000 mg | Freq: Once | INTRAMUSCULAR | Status: AC
  Administered 2018-04-03: 80 mg via INTRAMUSCULAR

## 2018-04-03 NOTE — Assessment & Plan Note (Signed)
Discussed.

## 2018-04-03 NOTE — Assessment & Plan Note (Signed)
Labs reviewed.

## 2018-04-03 NOTE — Assessment & Plan Note (Signed)
  On diet  

## 2018-04-03 NOTE — Progress Notes (Signed)
Subjective:  Patient ID: [name], female    DOB: 12/19/1932  Age: 84 y.o. MRN: [id number]  CC: No chief complaint on file.   HPI LATANA COLIN presents for PMR, chronic pain, fatigue F/u ER visit for CHF  Outpatient Medications Prior to Visit  Medication Sig Dispense Refill  . acetaminophen (TYLENOL) 500 MG tablet Take 500 mg by mouth every 6 (six) hours as needed.     . Alcohol Swabs (B-D SINGLE USE SWABS REGULAR) PADS Use to clean area to check blood sugars twice a day 300 each 1  . aspirin EC 325 MG tablet Take 325 mg by mouth daily.    . Blood Glucose Calibration (ACCU-CHEK AVIVA) SOLN Use as directed 3 each 1  . carvedilol (COREG) 25 MG tablet Take 1 tablet (25 mg total) by mouth 2 (two) times daily with a meal. 180 tablet 1  . cholecalciferol (VITAMIN D) 1000 units tablet Take 1,000 Units by mouth daily.    [name] glucose blood (ONETOUCH VERIO) test strip Use as instructed 300 each 3  . levETIRAcetam (KEPPRA) 500 MG tablet Take 1 tablet (500 mg total) by mouth 2 (two) times daily. 180 tablet 1  . meclizine (ANTIVERT) 25 MG tablet Take 0.5-1 tablets (12.5-25 mg total) by mouth 3 (three) times daily as needed for dizziness. 180 tablet 0  . ONETOUCH DELICA LANCETS FINE MISC 1 Device by Does not apply route daily as needed. 100 each 3  . predniSONE (DELTASONE) 5 MG tablet Take 1 tablet (5 mg total) by mouth daily with breakfast. 90 tablet 1  . traMADol (ULTRAM) 50 MG tablet Take 1 tablet (50 mg total) by mouth every 6 (six) hours as needed. 15 tablet 0  . vitamin B-12 (CYANOCOBALAMIN) 1000 MCG tablet Take 1,000 mcg by mouth daily.     No facility-administered medications prior to visit.     ROS Review of Systems  Constitutional: Positive for fatigue. Negative for activity change, appetite change, chills and unexpected weight change.  HENT: Negative for congestion, mouth sores and sinus pressure.   Eyes: Negative for visual disturbance.  Respiratory: Negative for cough and  chest tightness.   Gastrointestinal: Negative for abdominal pain and nausea.  Genitourinary: Negative for difficulty urinating, frequency and vaginal pain.  Musculoskeletal: Positive for arthralgias, back pain and gait problem.  Skin: Negative for pallor and rash.  Neurological: Negative for dizziness, tremors, weakness, numbness and headaches.  Psychiatric/Behavioral: Positive for decreased concentration and sleep disturbance. Negative for confusion. The patient is nervous/anxious.     Objective:  BP 138/76 (BP Location: Left Arm, Patient Position: Sitting, Cuff Size: Large)   Pulse 67   Temp 97.7 F (36.5 C) (Oral)   Ht 5\' 4"  (1.626 m)   Wt 192 lb (87.1 kg)   SpO2 97%   BMI 32.96 kg/m   BP Readings from Last 3 Encounters:  04/03/18 138/76  03/26/18 (!) 172/86  03/26/18 (!) 142/88    Wt Readings from Last 3 Encounters:  04/03/18 192 lb (87.1 kg)  03/26/18 200 lb (90.7 kg)  03/26/18 200 lb (90.7 kg)    Physical Exam  Constitutional: She appears well-developed. No distress.  HENT:  Head: Normocephalic.  Right Ear: External ear normal.  Left Ear: External ear normal.  Nose: Nose normal.  Mouth/Throat: Oropharynx is clear and moist.  Eyes: Pupils are equal, round, and reactive to light. Conjunctivae are normal. Right eye exhibits no discharge. Left eye exhibits no discharge.  Neck: Normal range of  motion. Neck supple. No JVD present. No tracheal deviation present. No thyromegaly present.  Cardiovascular: Normal rate, regular rhythm and normal heart sounds.  Pulmonary/Chest: No stridor. No respiratory distress. She has no wheezes.  Abdominal: Soft. Bowel sounds are normal. She exhibits no distension and no mass. There is no tenderness. There is no rebound and no guarding.  Musculoskeletal: She exhibits tenderness. She exhibits no edema.  Lymphadenopathy:    She has no cervical adenopathy.  Neurological: She displays normal reflexes. No cranial nerve deficit. She exhibits  normal muscle tone. Coordination abnormal.  Skin: No rash noted. No erythema.  Psychiatric: Her behavior is normal. Judgment and thought content normal.    I spent total of 45 minutes face-to-face with patient and greater than 50% was spent counseling and or coordinating care - we discussed the need of a NAS diet, CHF, compliance issues....   Lab Results  Component Value Date   WBC 5.5 03/26/2018   HGB 9.6 (L) 03/26/2018   HCT 30.6 (L) 03/26/2018   PLT 204 03/26/2018   GLUCOSE 102 (H) 03/26/2018   CHOL 169 06/25/2015   TRIG 61 06/25/2015   HDL 57 06/25/2015   LDLDIRECT 114.8 01/10/2011   LDLCALC 100 (H) 06/25/2015   ALT 12 (L) 03/26/2018   AST 24 03/26/2018   NA 142 03/26/2018   K 4.3 03/26/2018   CL 111 03/26/2018   CREATININE 1.13 (H) 03/26/2018   BUN 26 (H) 03/26/2018   CO2 22 03/26/2018   TSH 2.89 12/26/2017   INR 1.09 07/17/2016   HGBA1C 6.8 (H) 12/26/2017    Dg Chest 2 View  Result Date: 03/26/2018 CLINICAL DATA:  Weakness.  Bilateral lower extremity swelling. EXAM: CHEST - 2 VIEW COMPARISON:  11/18/2017 FINDINGS: There is moderate cardiac enlargement. Aortic atherosclerosis identified. No pleural effusion or edema identified. No airspace opacities. Spondylosis noted within the thoracic spine. IMPRESSION: 1. No acute cardiopulmonary abnormalities. 2. Cardiac enlargement 3.  Aortic Atherosclerosis (ICD10-I70.0). Electronically Signed   By: Signa Kell M.D.   On: 03/26/2018 11:58   Ct Chest Wo Contrast  Result Date: 03/26/2018 CLINICAL DATA:  Chronic shortness of breath, lower extremity swelling EXAM: CT CHEST WITHOUT CONTRAST TECHNIQUE: Multidetector CT imaging of the chest was performed following the standard protocol without IV contrast. COMPARISON:  Chest x-ray of 03/26/2018 FINDINGS: Cardiovascular: The heart is mildly enlarged. No pericardial effusion is seen. Diffuse coronary artery calcifications are noted. Moderate thoracic aortic atherosclerosis is present. The  mid ascending thoracic aorta measures 34 mm in diameter Mediastinum/Nodes: No mediastinal or hilar adenopathy is seen. The thyroid gland is unremarkable. No hiatal hernia is seen. Lungs/Pleura: On lung window images, no pneumonia is seen. There is no present CT evidence of congestive heart failure. No pleural effusion is seen and there is no fluid noted within the fissures. No prominent interstitial markings are evident other than mild chronic change at the lung bases with mild bronchiectatic change particularly in the right lower lobe posterior medially. Small pleural plaques are noted posteriorly on the left without calcification. The central airway is patent. Upper Abdomen: The gallbladder has previously been resected. Common bile duct is dilated but possibly normal post cholecystectomy. Correlate with LFTs. The pancreatic duct is not appear to be dilated. Musculoskeletal: The thoracic vertebrae are in normal alignment with no acute abnormality. There are diffuse degenerative changes throughout the thoracic spine. IMPRESSION: 1. Diffuse coronary artery calcifications and moderate thoracic aortic atherosclerosis. 2. No evidence of congestive heart failure by CT is seen. No  pleural effusion is seen and no prominence of interstitial markings is noted. 3. Prominent common bile duct probably post cholecystectomy but correlate with LFTs. Electronically Signed   By: Dwyane Dee M.D.   On: 03/26/2018 14:54    Assessment & Plan:   There are no diagnoses linked to this encounter. I am having [name] maintain her vitamin B-12, aspirin EC, cholecalciferol, ONETOUCH DELICA LANCETS FINE, meclizine, ACCU-CHEK AVIVA, carvedilol, predniSONE, B-D SINGLE USE SWABS REGULAR, glucose blood, acetaminophen, levETIRAcetam, and traMADol.  No orders of the defined types were placed in this encounter.    Follow-up: No follow-ups on file.  [name], MD

## 2018-04-03 NOTE — Addendum Note (Signed)
Addended by: Scarlett Presto on: 04/03/2018 02:22 PM   Modules accepted: Orders

## 2018-04-03 NOTE — Assessment & Plan Note (Signed)
Depo-medrol Re-start Deltasone Risks associated with treatment noncompliance were discussed. Compliance was encouraged.

## 2018-04-03 NOTE — Assessment & Plan Note (Signed)
B12 inj 

## 2018-04-03 NOTE — Assessment & Plan Note (Signed)
5/19 BNP 350 ECHO 2017 - good LV; diast dysf grade 1 NAS diet Lasix prn

## 2018-04-03 NOTE — Patient Instructions (Signed)
Heart Failure Heart failure means your heart has trouble pumping blood. This makes it hard for your body to work well. Heart failure is usually a long-term (chronic) condition. You must take good care of yourself and follow your doctor's treatment plan. Follow these instructions at home:  Take your heart medicine as told by your doctor. ? Do not stop taking medicine unless your doctor tells you to. ? Do not skip any dose of medicine. ? Refill your medicines before they run out. ? Take other medicines only as told by your doctor or pharmacist.  Stay active if told by your doctor. The elderly and people with severe heart failure should talk with a doctor about physical activity.  Eat heart-healthy foods. Choose foods that are without trans fat and are low in saturated fat, cholesterol, and salt (sodium). This includes fresh or frozen fruits and vegetables, fish, lean meats, fat-free or low-fat dairy foods, whole grains, and high-fiber foods. Lentils and dried peas and beans (legumes) are also good choices.  Limit salt if told by your doctor.  Cook in a healthy way. Roast, grill, broil, bake, poach, steam, or stir-fry foods.  Limit fluids as told by your doctor.  Weigh yourself every morning. Do this after you pee (urinate) and before you eat breakfast. Write down your weight to give to your doctor.  Take your blood pressure and write it down if your doctor tells you to.  Ask your doctor how to check your pulse. Check your pulse as told.  Lose weight if told by your doctor.  Stop smoking or chewing tobacco. Do not use gum or patches that help you quit without your doctor's approval.  Schedule and go to doctor visits as told.  Nonpregnant women should have no more than 1 drink a day. Men should have no more than 2 drinks a day. Talk to your doctor about drinking alcohol.  Stop illegal drug use.  Stay current with shots (immunizations).  Manage your health conditions as told by your  doctor.  Learn to manage your stress.  Rest when you are tired.  If it is really hot outside: ? Avoid intense activities. ? Use air conditioning or fans, or get in a cooler place. ? Avoid caffeine and alcohol. ? Wear loose-fitting, lightweight, and light-colored clothing.  If it is really cold outside: ? Avoid intense activities. ? Layer your clothing. ? Wear mittens or gloves, a hat, and a scarf when going outside. ? Avoid alcohol.  Learn about heart failure and get support as needed.  Get help to maintain or improve your quality of life and your ability to care for yourself as needed. Contact a doctor if:  You gain weight quickly.  You are more short of breath than usual.  You cannot do your normal activities.  You tire easily.  You cough more than normal, especially with activity.  You have any or more puffiness (swelling) in areas such as your hands, feet, ankles, or belly (abdomen).  You cannot sleep because it is hard to breathe.  You feel like your heart is beating fast (palpitations).  You get dizzy or light-headed when you stand up. Get help right away if:  You have trouble breathing.  There is a change in mental status, such as becoming less alert or not being able to focus.  You have chest pain or discomfort.  You faint. This information is not intended to replace advice given to you by your health care provider. Make sure you   discuss any questions you have with your health care provider. Document Released: 08/07/2008 Document Revised: 04/05/2016 Document Reviewed: 12/15/2012 Elsevier Interactive Patient Education  2017 [employer]   Low-Sodium Eating Plan Sodium, which is an element that makes up salt, helps you maintain a healthy balance of fluids in your body. Too much sodium can increase your blood pressure and cause fluid and waste to be held in your body. Your health care provider or dietitian may recommend following this plan if you have  high blood pressure (hypertension), kidney disease, liver disease, or heart failure. Eating less sodium can help lower your blood pressure, reduce swelling, and protect your heart, liver, and kidneys. What are tips for following this plan? General guidelines  Most people on this plan should limit their sodium intake to 1,500-2,000 mg (milligrams) of sodium each day. Reading food labels  The Nutrition Facts label lists the amount of sodium in one serving of the food. If you eat more than one serving, you must multiply the listed amount of sodium by the number of servings.  Choose foods with less than 140 mg of sodium per serving.  Avoid foods with 300 mg of sodium or more per serving. Shopping  Look for lower-sodium products, often labeled as "low-sodium" or "no salt added."  Always check the sodium content even if foods are labeled as "unsalted" or "no salt added".  Buy fresh foods. ? Avoid canned foods and premade or frozen meals. ? Avoid canned, cured, or processed meats  Buy breads that have less than 80 mg of sodium per slice. Cooking  Eat more home-cooked food and less restaurant, buffet, and fast food.  Avoid adding salt when cooking. Use salt-free seasonings or herbs instead of table salt or sea salt. Check with your health care provider or pharmacist before using salt substitutes.  Cook with plant-based oils, such as canola, sunflower, or olive oil. Meal planning  When eating at a restaurant, ask that your food be prepared with less salt or no salt, if possible.  Avoid foods that contain MSG (monosodium glutamate). MSG is sometimes added to Chinese food, bouillon, and some canned foods. What foods are recommended? The items listed may not be a complete list. Talk with your dietitian about what dietary choices are best for you. Grains Low-sodium cereals, including oats, puffed wheat and rice, and shredded wheat. Low-sodium crackers. Unsalted rice. Unsalted pasta.  Low-sodium bread. Whole-grain breads and whole-grain pasta. Vegetables Fresh or frozen vegetables. "No salt added" canned vegetables. "No salt added" tomato sauce and paste. Low-sodium or reduced-sodium tomato and vegetable juice. Fruits Fresh, frozen, or canned fruit. Fruit juice. Meats and other protein foods Fresh or frozen (no salt added) meat, poultry, seafood, and fish. Low-sodium canned tuna and salmon. Unsalted nuts. Dried peas, beans, and lentils without added salt. Unsalted canned beans. Eggs. Unsalted nut butters. Dairy Milk. Soy milk. Cheese that is naturally low in sodium, such as ricotta cheese, fresh mozzarella, or Swiss cheese Low-sodium or reduced-sodium cheese. Cream cheese. Yogurt. Fats and oils Unsalted butter. Unsalted margarine with no trans fat. Vegetable oils such as canola or olive oils. Seasonings and other foods Fresh and dried herbs and spices. Salt-free seasonings. Low-sodium mustard and ketchup. Sodium-free salad dressing. Sodium-free light mayonnaise. Fresh or refrigerated horseradish. Lemon juice. Vinegar. Homemade, reduced-sodium, or low-sodium soups. Unsalted popcorn and pretzels. Low-salt or salt-free chips. What foods are not recommended? The items listed may not be a complete list. Talk with your dietitian about what dietary choices are best   for you. Grains Instant hot cereals. Bread stuffing, pancake, and biscuit mixes. Croutons. Seasoned rice or pasta mixes. Noodle soup cups. Boxed or frozen macaroni and cheese. Regular salted crackers. Self-rising flour. Vegetables Sauerkraut, pickled vegetables, and relishes. Olives. French fries. Onion rings. Regular canned vegetables (not low-sodium or reduced-sodium). Regular canned tomato sauce and paste (not low-sodium or reduced-sodium). Regular tomato and vegetable juice (not low-sodium or reduced-sodium). Frozen vegetables in sauces. Meats and other protein foods Meat or fish that is salted, canned, smoked, spiced,  or pickled. Bacon, ham, sausage, hotdogs, corned beef, chipped beef, packaged lunch meats, salt pork, jerky, pickled herring, anchovies, regular canned tuna, sardines, salted nuts. Dairy Processed cheese and cheese spreads. Cheese curds. Blue cheese. Feta cheese. String cheese. Regular cottage cheese. Buttermilk. Canned milk. Fats and oils Salted butter. Regular margarine. Ghee. Bacon fat. Seasonings and other foods Onion salt, garlic salt, seasoned salt, table salt, and sea salt. Canned and packaged gravies. Worcestershire sauce. Tartar sauce. Barbecue sauce. Teriyaki sauce. Soy sauce, including reduced-sodium. Steak sauce. Fish sauce. Oyster sauce. Cocktail sauce. Horseradish that you find on the shelf. Regular ketchup and mustard. Meat flavorings and tenderizers. Bouillon cubes. Hot sauce and Tabasco sauce. Premade or packaged marinades. Premade or packaged taco seasonings. Relishes. Regular salad dressings. Salsa. Potato and tortilla chips. Corn chips and puffs. Salted popcorn and pretzels. Canned or dried soups. Pizza. Frozen entrees and pot pies. Summary  Eating less sodium can help lower your blood pressure, reduce swelling, and protect your heart, liver, and kidneys.  Most people on this plan should limit their sodium intake to 1,500-2,000 mg (milligrams) of sodium each day.  Canned, boxed, and frozen foods are high in sodium. Restaurant foods, fast foods, and pizza are also very high in sodium. You also get sodium by adding salt to food.  Try to cook at home, eat more fresh fruits and vegetables, and eat less fast food, canned, processed, or prepared foods. This information is not intended to replace advice given to you by your health care provider. Make sure you discuss any questions you have with your health care provider. Document Released: 04/20/2002 Document Revised: 10/22/2016 Document Reviewed: 10/22/2016 Elsevier Interactive Patient Education  2018 [employer]  

## 2018-06-03 ENCOUNTER — Ambulatory Visit: Payer: Medicare HMO | Admitting: Internal Medicine

## 2018-06-04 ENCOUNTER — Ambulatory Visit: Payer: Medicare HMO | Admitting: Internal Medicine

## 2018-06-23 ENCOUNTER — Encounter: Payer: Self-pay | Admitting: Internal Medicine

## 2018-06-23 ENCOUNTER — Ambulatory Visit (INDEPENDENT_AMBULATORY_CARE_PROVIDER_SITE_OTHER): Payer: Medicare HMO | Admitting: Internal Medicine

## 2018-06-23 DIAGNOSIS — R5382 Chronic fatigue, unspecified: Secondary | ICD-10-CM | POA: Diagnosis not present

## 2018-06-23 DIAGNOSIS — F4323 Adjustment disorder with mixed anxiety and depressed mood: Secondary | ICD-10-CM | POA: Diagnosis not present

## 2018-06-23 DIAGNOSIS — E538 Deficiency of other specified B group vitamins: Secondary | ICD-10-CM | POA: Diagnosis not present

## 2018-06-23 DIAGNOSIS — R110 Nausea: Secondary | ICD-10-CM | POA: Diagnosis not present

## 2018-06-23 DIAGNOSIS — I10 Essential (primary) hypertension: Secondary | ICD-10-CM | POA: Diagnosis not present

## 2018-06-23 DIAGNOSIS — M353 Polymyalgia rheumatica: Secondary | ICD-10-CM | POA: Diagnosis not present

## 2018-06-23 DIAGNOSIS — G9332 Myalgic encephalomyelitis/chronic fatigue syndrome: Secondary | ICD-10-CM

## 2018-06-23 DIAGNOSIS — I129 Hypertensive chronic kidney disease with stage 1 through stage 4 chronic kidney disease, or unspecified chronic kidney disease: Secondary | ICD-10-CM

## 2018-06-23 DIAGNOSIS — E1121 Type 2 diabetes mellitus with diabetic nephropathy: Secondary | ICD-10-CM | POA: Diagnosis not present

## 2018-06-23 DIAGNOSIS — I130 Hypertensive heart and chronic kidney disease with heart failure and stage 1 through stage 4 chronic kidney disease, or unspecified chronic kidney disease: Secondary | ICD-10-CM

## 2018-06-23 DIAGNOSIS — N19 Unspecified kidney failure: Secondary | ICD-10-CM

## 2018-06-23 MED ORDER — PREDNISONE 5 MG PO TABS: 10.0000 mg | ORAL_TABLET | Freq: Every day | ORAL | 1 refills | Status: DC

## 2018-06-23 MED ORDER — ONDANSETRON HCL 4 MG PO TABS: 4.0000 mg | ORAL_TABLET | Freq: Two times a day (BID) | ORAL | 0 refills | Status: DC | PRN

## 2018-06-23 MED ORDER — METHYLPREDNISOLONE ACETATE 80 MG/ML IJ SUSP
80.0000 mg | Freq: Once | INTRAMUSCULAR | Status: AC
  Administered 2018-06-23: 80 mg via INTRAMUSCULAR

## 2018-06-23 NOTE — Assessment & Plan Note (Signed)
On B12 

## 2018-06-23 NOTE — Assessment & Plan Note (Signed)
BP Readings from Last 3 Encounters:  06/23/18 134/72  04/03/18 138/76  03/26/18 (!) 172/86

## 2018-06-23 NOTE — Assessment & Plan Note (Signed)
A1c

## 2018-06-23 NOTE — Assessment & Plan Note (Signed)
Worse Increase Prednisone to 10 mg/d 

## 2018-06-23 NOTE — Assessment & Plan Note (Signed)
No NSAIDs 

## 2018-06-23 NOTE — Assessment & Plan Note (Signed)
Depo-medrol IM 80 mg 

## 2018-06-23 NOTE — Progress Notes (Signed)
Subjective:  Patient ID: [name], female    DOB: 01-02-1933  Age: 84 y.o. MRN: [id number]  CC: No chief complaint on file.   HPI [name] presents for fatigue, arthralgia, nausea Not better  Outpatient Medications Prior to Visit  Medication Sig Dispense Refill  . acetaminophen (TYLENOL) 500 MG tablet Take 500 mg by mouth every 6 (six) hours as needed.     . Alcohol Swabs (B-D SINGLE USE SWABS REGULAR) PADS Use to clean area to check blood sugars twice a day 300 each 1  . aspirin (BAYER ASPIRIN) 325 MG tablet Take 1 tablet (325 mg total) by mouth daily. 100 tablet 3  . Blood Glucose Calibration (ACCU-CHEK AVIVA) SOLN Use as directed 3 each 1  . carvedilol (COREG) 25 MG tablet Take 1 tablet (25 mg total) by mouth 2 (two) times daily with a meal. 180 tablet 1  . cholecalciferol (VITAMIN D) 1000 units tablet Take 1,000 Units by mouth daily.    . furosemide (LASIX) 20 MG tablet Take 1 tablet (20 mg total) by mouth daily as needed. Leg swelling 90 tablet 1  . glucose blood (ONETOUCH VERIO) test strip Use as instructed 300 each 3  . levETIRAcetam (KEPPRA) 500 MG tablet Take 1 tablet (500 mg total) by mouth 2 (two) times daily. 180 tablet 1  . meclizine (ANTIVERT) 25 MG tablet Take 0.5-1 tablets (12.5-25 mg total) by mouth 3 (three) times daily as needed for dizziness. 180 tablet 0  . ONETOUCH DELICA LANCETS FINE MISC 1 Device by Does not apply route daily as needed. 100 each 3  . predniSONE (DELTASONE) 5 MG tablet Take 1 tablet (5 mg total) by mouth daily with breakfast. 90 tablet 1  . traMADol (ULTRAM) 50 MG tablet Take 1 tablet (50 mg total) by mouth every 6 (six) hours as needed. 15 tablet 0  . vitamin B-12 (CYANOCOBALAMIN) 1000 MCG tablet Take 1,000 mcg by mouth daily.     No facility-administered medications prior to visit.     ROS: Review of Systems  Constitutional: Positive for fatigue. Negative for activity change, appetite change, chills and unexpected weight  change.  HENT: Negative for congestion, mouth sores and sinus pressure.   Eyes: Negative for visual disturbance.  Respiratory: Negative for cough and chest tightness.   Gastrointestinal: Positive for nausea. Negative for abdominal pain.  Genitourinary: Negative for difficulty urinating, frequency and vaginal pain.  Musculoskeletal: Positive for arthralgias, back pain, gait problem, myalgias, neck pain and neck stiffness.  Skin: Negative for pallor and rash.  Neurological: Positive for weakness. Negative for dizziness, tremors, numbness and headaches.  Psychiatric/Behavioral: Positive for dysphoric mood. Negative for confusion and sleep disturbance. The patient is nervous/anxious.     Objective:  BP 134/72 (BP Location: Left Arm, Patient Position: Sitting, Cuff Size: Large)   Pulse 64   Temp 97.9 F (36.6 C) (Oral)   Ht 5\' 4"  (1.626 m)   Wt 185 lb (83.9 kg)   SpO2 99%   BMI 31.76 kg/m   BP Readings from Last 3 Encounters:  06/23/18 134/72  04/03/18 138/76  03/26/18 (!) 172/86    Wt Readings from Last 3 Encounters:  06/23/18 185 lb (83.9 kg)  04/03/18 192 lb (87.1 kg)  03/26/18 200 lb (90.7 kg)    Physical Exam  Constitutional: She appears well-developed. No distress.  HENT:  Head: Normocephalic.  Right Ear: External ear normal.  Left Ear: External ear normal.  Nose: Nose normal.  Mouth/Throat: Oropharynx is  clear and moist.  Eyes: Pupils are equal, round, and reactive to light. Conjunctivae are normal. Right eye exhibits no discharge. Left eye exhibits no discharge.  Neck: Normal range of motion. Neck supple. No JVD present. No tracheal deviation present. No thyromegaly present.  Cardiovascular: Normal rate, regular rhythm and normal heart sounds.  Pulmonary/Chest: No stridor. No respiratory distress. She has no wheezes.  Abdominal: Soft. Bowel sounds are normal. She exhibits no distension and no mass. There is no tenderness. There is no rebound and no guarding.    Musculoskeletal: She exhibits tenderness. She exhibits no edema.  Lymphadenopathy:    She has no cervical adenopathy.  Neurological: She displays normal reflexes. No cranial nerve deficit. She exhibits normal muscle tone. Coordination abnormal.  Skin: No rash noted. No erythema.  Psychiatric: She has a normal mood and affect. Her behavior is normal. Judgment and thought content normal.  restricted movement Pain w/ROM - all joints hurt  Lab Results  Component Value Date   WBC 5.5 03/26/2018   HGB 9.6 (L) 03/26/2018   HCT 30.6 (L) 03/26/2018   PLT 204 03/26/2018   GLUCOSE 102 (H) 03/26/2018   CHOL 169 06/25/2015   TRIG 61 06/25/2015   HDL 57 06/25/2015   LDLDIRECT 114.8 01/10/2011   LDLCALC 100 (H) 06/25/2015   ALT 12 (L) 03/26/2018   AST 24 03/26/2018   NA 142 03/26/2018   K 4.3 03/26/2018   CL 111 03/26/2018   CREATININE 1.13 (H) 03/26/2018   BUN 26 (H) 03/26/2018   CO2 22 03/26/2018   TSH 2.89 12/26/2017   INR 1.09 07/17/2016   HGBA1C 6.8 (H) 12/26/2017    Dg Chest 2 View  Result Date: 03/26/2018 CLINICAL DATA:  Weakness.  Bilateral lower extremity swelling. EXAM: CHEST - 2 VIEW COMPARISON:  11/18/2017 FINDINGS: There is moderate cardiac enlargement. Aortic atherosclerosis identified. No pleural effusion or edema identified. No airspace opacities. Spondylosis noted within the thoracic spine. IMPRESSION: 1. No acute cardiopulmonary abnormalities. 2. Cardiac enlargement 3.  Aortic Atherosclerosis (ICD10-I70.0). Electronically Signed   By: Signa Kell M.D.   On: 03/26/2018 11:58   Ct Chest Wo Contrast  Result Date: 03/26/2018 CLINICAL DATA:  Chronic shortness of breath, lower extremity swelling EXAM: CT CHEST WITHOUT CONTRAST TECHNIQUE: Multidetector CT imaging of the chest was performed following the standard protocol without IV contrast. COMPARISON:  Chest x-ray of 03/26/2018 FINDINGS: Cardiovascular: The heart is mildly enlarged. No pericardial effusion is seen. Diffuse  coronary artery calcifications are noted. Moderate thoracic aortic atherosclerosis is present. The mid ascending thoracic aorta measures 34 mm in diameter Mediastinum/Nodes: No mediastinal or hilar adenopathy is seen. The thyroid gland is unremarkable. No hiatal hernia is seen. Lungs/Pleura: On lung window images, no pneumonia is seen. There is no present CT evidence of congestive heart failure. No pleural effusion is seen and there is no fluid noted within the fissures. No prominent interstitial markings are evident other than mild chronic change at the lung bases with mild bronchiectatic change particularly in the right lower lobe posterior medially. Small pleural plaques are noted posteriorly on the left without calcification. The central airway is patent. Upper Abdomen: The gallbladder has previously been resected. Common bile duct is dilated but possibly normal post cholecystectomy. Correlate with LFTs. The pancreatic duct is not appear to be dilated. Musculoskeletal: The thoracic vertebrae are in normal alignment with no acute abnormality. There are diffuse degenerative changes throughout the thoracic spine. IMPRESSION: 1. Diffuse coronary artery calcifications and moderate thoracic aortic atherosclerosis.  2. No evidence of congestive heart failure by CT is seen. No pleural effusion is seen and no prominence of interstitial markings is noted. 3. Prominent common bile duct probably post cholecystectomy but correlate with LFTs. Electronically Signed   By: Dwyane Dee M.D.   On: 03/26/2018 14:54    Assessment & Plan:   There are no diagnoses linked to this encounter.   No orders of the defined types were placed in this encounter.    Follow-up: No follow-ups on file.  [name], MD

## 2018-06-23 NOTE — Assessment & Plan Note (Signed)
Lexapro 5 mg/d Stress w/hsband

## 2018-06-23 NOTE — Assessment & Plan Note (Signed)
Zofran prn

## 2018-07-02 ENCOUNTER — Telehealth: Payer: Self-pay

## 2018-07-02 DIAGNOSIS — M25562 Pain in left knee: Principal | ICD-10-CM

## 2018-07-02 DIAGNOSIS — M25561 Pain in right knee: Principal | ICD-10-CM

## 2018-07-02 DIAGNOSIS — G8929 Other chronic pain: Secondary | ICD-10-CM

## 2018-07-02 NOTE — Telephone Encounter (Signed)
Please advise about referral  Copied from CRM [id number]. Topic: Referral - Request >> Jul 02, 2018 10:04 AM Arlyss Gandy, NT wrote: Reason for CRM: Pts daughter requesting a referral for her mom to a orthopedic for bilateral knee pain. She would like the referral to go to [facility] in [place]. Phone#: [phone] >> Jul 02, 2018 10:08 AM Morphies, Hermine Messick wrote: Please advise if patient will need an appointment.  >> Jul 02, 2018 10:23 AM Waymon Amato wrote: Pt daughter called back with the name of the orthopedic provider they would like to be referred to -  Dr. Artist Pais

## 2018-07-03 NOTE — Telephone Encounter (Signed)
Okay.  Will do.  Thank you 

## 2018-07-11 ENCOUNTER — Other Ambulatory Visit: Payer: Self-pay | Admitting: Internal Medicine

## 2018-07-11 ENCOUNTER — Telehealth: Payer: Self-pay | Admitting: Internal Medicine

## 2018-07-11 MED ORDER — CARVEDILOL 25 MG PO TABS: 25.0000 mg | ORAL_TABLET | Freq: Two times a day (BID) | ORAL | 0 refills | Status: DC

## 2018-07-11 NOTE — Telephone Encounter (Signed)
Copied from CRM [id number]. Topic: Quick Communication - Rx Refill/Question >> Jul 11, 2018 12:49 PM Mcneil, Jannifer Rodney wrote: Pt states she needs a 7 day supply until the refill from [facility] is delivered.  Medication: carvedilol (COREG) 25 MG tablet  Has the patient contacted their pharmacy? yes   Preferred Pharmacy (with phone number or street name): [facility] [address] [place], [place] - [address] [phone] (Phone) [phone] (Fax)  Agent: Please be advised that RX refills may take up to 3 business days. We ask that you follow-up with your pharmacy.

## 2018-07-11 NOTE — Telephone Encounter (Signed)
Reviewed chart pt is up-to-date sent [facility].Marland KitchenRaechel Jefferson

## 2018-07-16 ENCOUNTER — Other Ambulatory Visit: Payer: Self-pay

## 2018-07-16 MED ORDER — CARVEDILOL 25 MG PO TABS: 25.0000 mg | ORAL_TABLET | Freq: Two times a day (BID) | ORAL | 0 refills | Status: DC

## 2018-08-04 DIAGNOSIS — M170 Bilateral primary osteoarthritis of knee: Secondary | ICD-10-CM | POA: Diagnosis not present

## 2018-08-05 ENCOUNTER — Ambulatory Visit: Payer: Self-pay

## 2018-08-05 ENCOUNTER — Telehealth: Payer: Self-pay

## 2018-08-05 NOTE — Telephone Encounter (Signed)
  Pt. Reports she has been having tingling to her feet and legs for 1-2 weeks. Sometimes "my toes feel numb." Reports she saw her orthopedic doctor yesterday. Did not tell him - requests a referral to neurology and "my heart doctor." Denies any other symptoms. Answer Assessment - Initial Assessment Questions 1. SYMPTOM: "What is the main symptom you are concerned about?" (e.g., weakness, numbness)     Tingling to both feet and lower legs - have way up her shins 2. ONSET: "When did this start?" (minutes, hours, days; while sleeping)     Started 1-2 weeks ago 3. LAST NORMAL: "When was the last time you were normal (no symptoms)?"     Unsure 4. PATTERN "Does this come and go, or has it been constant since it started?"  "Is it present now?"     Comes and goes 5. CARDIAC SYMPTOMS: "Have you had any of the following symptoms: chest pain, difficulty breathing, palpitations?"     No 6. NEUROLOGIC SYMPTOMS: "Have you had any of the following symptoms: headache, dizziness, vision loss, double vision, changes in speech, unsteady on your feet?"     No 7. OTHER SYMPTOMS: "Do you have any other symptoms?"     No 8. PREGNANCY: "Is there any chance you are pregnant?" "When was your last menstrual period?"     No  Protocols used: NEUROLOGIC DEFICIT-A-AH

## 2018-08-05 NOTE — Telephone Encounter (Signed)
Please advise about referral  Copied from CRM [id number]. Topic: Referral - Request >> Aug 05, 2018 12:46 PM Angela Nevin wrote: Reason for CRM: Pt is requesting a referral to podiatrist to trim her toenails. Pt did not have preference of provider.   Cb# [phone]

## 2018-08-05 NOTE — Telephone Encounter (Signed)
Please advise 

## 2018-08-06 ENCOUNTER — Other Ambulatory Visit: Payer: Self-pay | Admitting: Internal Medicine

## 2018-08-06 DIAGNOSIS — M79676 Pain in unspecified toe(s): Secondary | ICD-10-CM

## 2018-08-06 NOTE — Telephone Encounter (Signed)
OK. Thx

## 2018-08-06 NOTE — Telephone Encounter (Signed)
We can discuss this complaint in the next office visit.  If you cannot wait please make an appointment to see another provider. Thanks

## 2018-08-06 NOTE — Telephone Encounter (Signed)
Unable to reach pt

## 2018-08-14 ENCOUNTER — Telehealth: Payer: Self-pay | Admitting: Internal Medicine

## 2018-08-14 DIAGNOSIS — G8929 Other chronic pain: Secondary | ICD-10-CM

## 2018-08-14 DIAGNOSIS — M25562 Pain in left knee: Principal | ICD-10-CM

## 2018-08-14 DIAGNOSIS — M25561 Pain in right knee: Principal | ICD-10-CM

## 2018-08-14 NOTE — Telephone Encounter (Signed)
Copied from CRM [id number]. Topic: Quick Communication - See Telephone Encounter >> Aug 14, 2018 12:02 PM Lorrine Kin, NT wrote: CRM for notification. See Telephone encounter for: 08/14/18. Patient calling and is wanting to know if Dr Posey Rea could find someone to do her knee injections that would be covered by her insurance? Please advise. CB#: [phone]

## 2018-08-14 NOTE — Telephone Encounter (Signed)
Patient states Dr. Posey Rea referred her to a orthopedic.  Patient states the orthopedic was to refer her to someone else for a "gel" injection.  Patient states that she has not heard anything in regard.  I have advised patient to call the orthopedic office to find out where they were to refer her to.  I have told her to give [place] a call back if she has any trouble.

## 2018-08-15 NOTE — Telephone Encounter (Signed)
noted 

## 2018-08-22 NOTE — Telephone Encounter (Signed)
Pt called back in to make provider aware that [facility] will approve her seeing Dr. Luisa Hart w/ [facility] for her knee injections.   Pt would like to know if this could be completed as soon as possible.

## 2018-08-22 NOTE — Telephone Encounter (Signed)
Referral placed.

## 2018-08-28 ENCOUNTER — Encounter: Payer: Self-pay | Admitting: Podiatry

## 2018-08-28 ENCOUNTER — Ambulatory Visit: Payer: Medicare HMO | Admitting: Podiatry

## 2018-08-28 VITALS — BP 173/79 | HR 58

## 2018-08-28 DIAGNOSIS — E1169 Type 2 diabetes mellitus with other specified complication: Secondary | ICD-10-CM | POA: Diagnosis not present

## 2018-08-28 DIAGNOSIS — M2012 Hallux valgus (acquired), left foot: Secondary | ICD-10-CM

## 2018-08-28 DIAGNOSIS — M2041 Other hammer toe(s) (acquired), right foot: Secondary | ICD-10-CM

## 2018-08-28 DIAGNOSIS — B351 Tinea unguium: Secondary | ICD-10-CM | POA: Diagnosis not present

## 2018-08-28 DIAGNOSIS — E1151 Type 2 diabetes mellitus with diabetic peripheral angiopathy without gangrene: Secondary | ICD-10-CM

## 2018-08-28 DIAGNOSIS — M2042 Other hammer toe(s) (acquired), left foot: Secondary | ICD-10-CM

## 2018-08-28 DIAGNOSIS — E119 Type 2 diabetes mellitus without complications: Secondary | ICD-10-CM

## 2018-08-28 DIAGNOSIS — M2011 Hallux valgus (acquired), right foot: Secondary | ICD-10-CM

## 2018-08-29 NOTE — Telephone Encounter (Signed)
Called [facility] and LVM for someone to call me back on what the names of the gel I njections they have are,   LVM for patient to call back and tell me the name of the injection that was approved by her insurance.

## 2018-08-29 NOTE — Telephone Encounter (Signed)
Patient called in and states she doesn't know the injections approved by her insurance

## 2018-08-29 NOTE — Telephone Encounter (Signed)
Please advise referral was sent to orthopedic not [facility]

## 2018-09-01 NOTE — Telephone Encounter (Signed)
Spoke to [employer] [place] at [facility]. He informed me that with the patients insurance a RX has to be sent to the [facility] specialty pharmacy and they will call the patient if the patient owes any out of pocket cost for the orthovisc injections(only injections covered by [employer]) if so the patient has to pay that out of pocket cost, once paid the specialty pharmacy will send the injections to [facility] and they will call and get the appt scheduled.  The patient had an appt with [facility] last week and no showed. I called patient and informed her of all of this.

## 2018-09-23 ENCOUNTER — Other Ambulatory Visit: Payer: Medicare HMO | Admitting: Orthotics

## 2018-09-25 ENCOUNTER — Ambulatory Visit: Payer: Medicare HMO | Admitting: Internal Medicine

## 2018-09-29 ENCOUNTER — Telehealth: Payer: Self-pay | Admitting: Internal Medicine

## 2018-09-29 NOTE — Telephone Encounter (Signed)
Ok Rx [employer]

## 2018-09-29 NOTE — Telephone Encounter (Signed)
Patient not seen since august, is this possible? Or is appt necessary?

## 2018-09-29 NOTE — Telephone Encounter (Signed)
Copied from CRM [id number]. Topic: General - Other >> Sep 29, 2018 10:36 AM [name], NT wrote: Reason for CRM: Pt states she is trying to get approved for a stair lift. She states she needs a rx for this by Friday is possible. She will send someone to pick the rx up. She also states she has to have approval for and injection of gel in her knee from Dr. Posey Rea before the ortho doctor will inject her. Please advise.

## 2018-10-01 NOTE — Telephone Encounter (Signed)
[name] can you print this RX since I am not in the office today.

## 2018-10-03 ENCOUNTER — Telehealth: Payer: Self-pay

## 2018-10-03 NOTE — Telephone Encounter (Signed)
FYI  Copied from CRM [id number]. Topic: General - Other >> Oct 03, 2018 12:39 PM [name] wrote: Reason for CRM: Patient called back to let Dr Posey Rea know that her daughter was sick and in the hospital the reason for her missing her last visit. Want him to know that she received the letter and have also rescheduled her appointment.

## 2018-10-03 NOTE — Telephone Encounter (Signed)
Pt states she already had it installed and does not need RX anymore

## 2018-10-06 NOTE — Telephone Encounter (Signed)
Noted. Thx.

## 2018-10-17 ENCOUNTER — Other Ambulatory Visit (INDEPENDENT_AMBULATORY_CARE_PROVIDER_SITE_OTHER): Payer: Medicare HMO

## 2018-10-17 ENCOUNTER — Encounter: Payer: Self-pay | Admitting: Internal Medicine

## 2018-10-17 ENCOUNTER — Ambulatory Visit (INDEPENDENT_AMBULATORY_CARE_PROVIDER_SITE_OTHER): Payer: Medicare HMO | Admitting: Internal Medicine

## 2018-10-17 VITALS — BP 142/82 | HR 61 | Temp 98.0°F | Ht 64.0 in | Wt 186.0 lb

## 2018-10-17 DIAGNOSIS — I10 Essential (primary) hypertension: Secondary | ICD-10-CM | POA: Diagnosis not present

## 2018-10-17 DIAGNOSIS — E1121 Type 2 diabetes mellitus with diabetic nephropathy: Secondary | ICD-10-CM

## 2018-10-17 DIAGNOSIS — I5031 Acute diastolic (congestive) heart failure: Secondary | ICD-10-CM

## 2018-10-17 DIAGNOSIS — I2510 Atherosclerotic heart disease of native coronary artery without angina pectoris: Secondary | ICD-10-CM

## 2018-10-17 DIAGNOSIS — M353 Polymyalgia rheumatica: Secondary | ICD-10-CM

## 2018-10-17 LAB — BASIC METABOLIC PANEL
BUN: 29 mg/dL — AB (ref 6–23)
CHLORIDE: 107 meq/L (ref 96–112)
CO2: 22 meq/L (ref 19–32)
Calcium: 9.7 mg/dL (ref 8.4–10.5)
Creatinine, Ser: 1.21 mg/dL — ABNORMAL HIGH (ref 0.40–1.20)
GFR: 54.58 mL/min — ABNORMAL LOW (ref >60.00)
GLUCOSE: 100 mg/dL — AB (ref 70–99)
POTASSIUM: 4.9 meq/L (ref 3.5–5.1)
Sodium: 138 mEq/L (ref 135–145)

## 2018-10-17 LAB — HEMOGLOBIN A1C: Hgb A1c MFr Bld: 6.7 % — ABNORMAL HIGH (ref 4.6–6.5)

## 2018-10-17 MED ORDER — CARVEDILOL 25 MG PO TABS: 25.0000 mg | ORAL_TABLET | Freq: Two times a day (BID) | ORAL | 3 refills | Status: DC

## 2018-10-17 MED ORDER — METHYLPREDNISOLONE ACETATE 80 MG/ML IJ SUSP
80.0000 mg | Freq: Once | INTRAMUSCULAR | Status: AC
  Administered 2018-10-17: 80 mg via INTRAMUSCULAR

## 2018-10-17 MED ORDER — FUROSEMIDE 20 MG PO TABS: 20.0000 mg | ORAL_TABLET | Freq: Every day | ORAL | 1 refills | Status: DC | PRN

## 2018-10-17 NOTE — Assessment & Plan Note (Signed)
ASA

## 2018-10-17 NOTE — Assessment & Plan Note (Signed)
On Coreg Risks associated with treatment noncompliance were discussed. Compliance was encouraged.

## 2018-10-17 NOTE — Assessment & Plan Note (Signed)
Coreg Risks associated with treatment noncompliance were discussed. Compliance was encouraged.

## 2018-10-17 NOTE — Progress Notes (Signed)
Subjective:  Patient ID: [name], female    DOB: [birth year]  Age: 84 y.o. MRN: [id number]  CC: No chief complaint on file.   HPI CALLIE FACEY presents for HTN, FMS/PMR, CFS f/u Knees w/bad OA  Outpatient Medications Prior to Visit  Medication Sig Dispense Refill  . acetaminophen (TYLENOL) 500 MG tablet Take 500 mg by mouth every 6 (six) hours as needed.     . Alcohol Swabs (B-D SINGLE USE SWABS REGULAR) PADS Use to clean area to check blood sugars twice a day 300 each 1  . aspirin (BAYER ASPIRIN) 325 MG tablet Take 1 tablet (325 mg total) by mouth daily. 100 tablet 3  . Blood Glucose Calibration (ACCU-CHEK AVIVA) SOLN Use as directed 3 each 1  . carvedilol (COREG) 25 MG tablet Take 1 tablet (25 mg total) by mouth 2 (two) times daily with a meal. 14 tablet 0  . cholecalciferol (VITAMIN D) 1000 units tablet Take 1,000 Units by mouth daily.    . furosemide (LASIX) 20 MG tablet Take 1 tablet (20 mg total) by mouth daily as needed. Leg swelling 90 tablet 1  . glucose blood (ONETOUCH VERIO) test strip Use as instructed 300 each 3  . levETIRAcetam (KEPPRA) 500 MG tablet Take 1 tablet (500 mg total) by mouth 2 (two) times daily. 180 tablet 1  . ondansetron (ZOFRAN) 4 MG tablet Take 1 tablet (4 mg total) by mouth 2 (two) times daily as needed for nausea or vomiting. 30 tablet 0  . ONETOUCH DELICA LANCETS FINE MISC 1 Device by Does not apply route daily as needed. 100 each 3  . predniSONE (DELTASONE) 5 MG tablet Take 2 tablets (10 mg total) by mouth daily with breakfast. 180 tablet 1  . vitamin B-12 (CYANOCOBALAMIN) 1000 MCG tablet Take 1,000 mcg by mouth daily.     No facility-administered medications prior to visit.     ROS: Review of Systems  Constitutional: Negative for activity change, appetite change, chills, fatigue and unexpected weight change.  HENT: Negative for congestion, mouth sores and sinus pressure.   Eyes: Negative for visual disturbance.  Respiratory: Negative  for cough and chest tightness.   Gastrointestinal: Negative for abdominal pain and nausea.  Genitourinary: Negative for difficulty urinating, frequency and vaginal pain.  Musculoskeletal: Positive for arthralgias, back pain, gait problem and myalgias.  Skin: Negative for pallor and rash.  Neurological: Negative for dizziness, tremors, weakness, numbness and headaches.  Psychiatric/Behavioral: Negative for confusion, sleep disturbance and suicidal ideas.    Objective:  BP (!) 142/82 (BP Location: Right Arm, Patient Position: Sitting, Cuff Size: Large)   Pulse 61   Temp 98 F (36.7 C) (Oral)   Ht 5\' 4"  (1.626 m)   Wt 186 lb (84.4 kg)   SpO2 99%   BMI 31.93 kg/m   BP Readings from Last 3 Encounters:  10/17/18 (!) 142/82  08/28/18 (!) 173/79  06/23/18 134/72    Wt Readings from Last 3 Encounters:  10/17/18 186 lb (84.4 kg)  06/23/18 185 lb (83.9 kg)  04/03/18 192 lb (87.1 kg)    Physical Exam  Constitutional: She appears well-developed. No distress.  HENT:  Head: Normocephalic.  Right Ear: External ear normal.  Left Ear: External ear normal.  Nose: Nose normal.  Mouth/Throat: Oropharynx is clear and moist.  Eyes: Pupils are equal, round, and reactive to light. Conjunctivae are normal. Right eye exhibits no discharge. Left eye exhibits no discharge.  Neck: Normal range of motion. Neck supple.  No JVD present. No tracheal deviation present. No thyromegaly present.  Cardiovascular: Normal rate, regular rhythm and normal heart sounds.  Pulmonary/Chest: No stridor. No respiratory distress. She has no wheezes.  Abdominal: Soft. Bowel sounds are normal. She exhibits no distension and no mass. There is no tenderness. There is no rebound and no guarding.  Musculoskeletal: She exhibits edema and tenderness.  Lymphadenopathy:    She has no cervical adenopathy.  Neurological: She displays normal reflexes. No cranial nerve deficit. She exhibits normal muscle tone. Coordination abnormal.   Skin: No rash noted. No erythema.  Psychiatric: She has a normal mood and affect. Her behavior is normal. Judgment and thought content normal.  walker Pains all over   Lab Results  Component Value Date   WBC 5.5 03/26/2018   HGB 9.6 (L) 03/26/2018   HCT 30.6 (L) 03/26/2018   PLT 204 03/26/2018   GLUCOSE 102 (H) 03/26/2018   CHOL 169 06/25/2015   TRIG 61 06/25/2015   HDL 57 06/25/2015   LDLDIRECT 114.8 01/10/2011   LDLCALC 100 (H) 06/25/2015   ALT 12 (L) 03/26/2018   AST 24 03/26/2018   NA 142 03/26/2018   K 4.3 03/26/2018   CL 111 03/26/2018   CREATININE 1.13 (H) 03/26/2018   BUN 26 (H) 03/26/2018   CO2 22 03/26/2018   TSH 2.89 12/26/2017   INR 1.09 07/17/2016   HGBA1C 6.8 (H) 12/26/2017    Dg Chest 2 View  Result Date: 03/26/2018 CLINICAL DATA:  Weakness.  Bilateral lower extremity swelling. EXAM: CHEST - 2 VIEW COMPARISON:  11/18/2017 FINDINGS: There is moderate cardiac enlargement. Aortic atherosclerosis identified. No pleural effusion or edema identified. No airspace opacities. Spondylosis noted within the thoracic spine. IMPRESSION: 1. No acute cardiopulmonary abnormalities. 2. Cardiac enlargement 3.  Aortic Atherosclerosis (ICD10-I70.0). Electronically Signed   By: Signa Kell M.D.   On: 03/26/2018 11:58   Ct Chest Wo Contrast  Result Date: 03/26/2018 CLINICAL DATA:  Chronic shortness of breath, lower extremity swelling EXAM: CT CHEST WITHOUT CONTRAST TECHNIQUE: Multidetector CT imaging of the chest was performed following the standard protocol without IV contrast. COMPARISON:  Chest x-ray of 03/26/2018 FINDINGS: Cardiovascular: The heart is mildly enlarged. No pericardial effusion is seen. Diffuse coronary artery calcifications are noted. Moderate thoracic aortic atherosclerosis is present. The mid ascending thoracic aorta measures 34 mm in diameter Mediastinum/Nodes: No mediastinal or hilar adenopathy is seen. The thyroid gland is unremarkable. No hiatal hernia is  seen. Lungs/Pleura: On lung window images, no pneumonia is seen. There is no present CT evidence of congestive heart failure. No pleural effusion is seen and there is no fluid noted within the fissures. No prominent interstitial markings are evident other than mild chronic change at the lung bases with mild bronchiectatic change particularly in the right lower lobe posterior medially. Small pleural plaques are noted posteriorly on the left without calcification. The central airway is patent. Upper Abdomen: The gallbladder has previously been resected. Common bile duct is dilated but possibly normal post cholecystectomy. Correlate with LFTs. The pancreatic duct is not appear to be dilated. Musculoskeletal: The thoracic vertebrae are in normal alignment with no acute abnormality. There are diffuse degenerative changes throughout the thoracic spine. IMPRESSION: 1. Diffuse coronary artery calcifications and moderate thoracic aortic atherosclerosis. 2. No evidence of congestive heart failure by CT is seen. No pleural effusion is seen and no prominence of interstitial markings is noted. 3. Prominent common bile duct probably post cholecystectomy but correlate with LFTs. Electronically Signed  By: Dwyane Dee M.D.   On: 03/26/2018 14:54    Assessment & Plan:   There are no diagnoses linked to this encounter.   No orders of the defined types were placed in this encounter.    Follow-up: No follow-ups on file.  [name], MD

## 2018-11-20 DIAGNOSIS — M170 Bilateral primary osteoarthritis of knee: Secondary | ICD-10-CM | POA: Diagnosis not present

## 2018-11-22 ENCOUNTER — Other Ambulatory Visit: Payer: Self-pay | Admitting: Internal Medicine

## 2018-11-26 ENCOUNTER — Other Ambulatory Visit: Payer: Self-pay | Admitting: Internal Medicine

## 2018-11-26 MED ORDER — LEVETIRACETAM 500 MG PO TABS: 500.0000 mg | ORAL_TABLET | Freq: Two times a day (BID) | ORAL | 0 refills | Status: DC

## 2018-11-26 NOTE — Telephone Encounter (Signed)
[facility] supply to [facility]../lm,b

## 2018-11-26 NOTE — Telephone Encounter (Signed)
Patient requesting a short supply of 5 pills until mail order arrives.   Pharmacy: [facility] [address] [place] - [address] [phone] (Phone) [phone] (Fax)

## 2018-11-26 NOTE — Telephone Encounter (Signed)
Copied from CRM [id number]. Topic: Quick Communication - Rx Refill/Question >> Nov 26, 2018 10:49 AM Jilda Roche wrote: Medication: levETIRAcetam (KEPPRA) 500 MG tablet  Has the patient contacted their pharmacy? Yes.   (Agent: If no, request that the patient contact the pharmacy for the refill.) (Agent: If yes, when and what did the pharmacy advise?) Refill sent to [facility] and she  will not get for about a week and she is out of medication, can a refill be sent to [facility], for enough for about 5 days. Please advise  Preferred Pharmacy (with phone number or street name): [facility] [address] [place], [place] - [address] [phone] (Phone) [phone] (Fax)    Agent: Please be advised that RX refills may take up to 3 business days. We ask that you follow-up with your pharmacy.

## 2018-11-28 ENCOUNTER — Ambulatory Visit: Payer: Medicare HMO | Admitting: Podiatry

## 2018-12-07 NOTE — Progress Notes (Signed)
Subjective:  Patient ID: Shelby SimmondsHelen G Jefferson, female    DOB: 02/17/1933,  MRN: [id number]  Chief Complaint  Patient presents with  . Nail Problem    great toenail is extremely long/and thick toenail fungus pt is also a diabetic type 2 HGB a1c 6.8 (12/26/17) Glucose 157    85 y.o. female presents  for diabetic foot care. Last AMBS was 157. Denies numbness and tingling in their feet. Denies cramping in legs and thighs.  Review of Systems: Negative except as noted in the HPI. Denies N/V/F/Ch.  Past Medical History:  Diagnosis Date  . Anemia    iron deficiency  . Aneurysm, thoracic aortic (HCC)   . Anxiety   . B12 deficiency   . CAD (coronary artery disease)    s/p stenting of LAD 1999- cath 5-08 EF normal LAD 30-40% restenosis. D1 50% D2 80% LCX & RCA minimal plaque  . Chronic back pain   . Constipation   . Depression   . Diabetes mellitus   . DVT (deep venous thrombosis) (HCC)   . GERD (gastroesophageal reflux disease)   . Gout   . HTN (hypertension)   . Hyperlipemia   . Obesity   . Osteoporosis   . Pancreatitis   . Polyarthritis    DJD/ possible PMR  . Renal insufficiency    Cr 1.2-1.3  . Seizures (HCC)   . Tinnitus   . Urinary frequency   . Vertigo   . Vitamin D deficiency     Current Outpatient Medications:  .  acetaminophen (TYLENOL) 500 MG tablet, Take 500 mg by mouth every 6 (six) hours as needed. , Disp: , Rfl:  .  Alcohol Swabs (B-D SINGLE USE SWABS REGULAR) PADS, Use to clean area to check blood sugars twice a day, Disp: 300 each, Rfl: 1 .  aspirin (BAYER ASPIRIN) 325 MG tablet, Take 1 tablet (325 mg total) by mouth daily., Disp: 100 tablet, Rfl: 3 .  Blood Glucose Calibration (ACCU-CHEK AVIVA) SOLN, Use as directed, Disp: 3 each, Rfl: 1 .  cholecalciferol (VITAMIN D) 1000 units tablet, Take 1,000 Units by mouth daily., Disp: , Rfl:  .  glucose blood (ONETOUCH VERIO) test strip, Use as instructed, Disp: 300 each, Rfl: 3 .  ondansetron (ZOFRAN) 4 MG tablet,  Take 1 tablet (4 mg total) by mouth 2 (two) times daily as needed for nausea or vomiting., Disp: 30 tablet, Rfl: 0 .  ONETOUCH DELICA LANCETS FINE MISC, 1 Device by Does not apply route daily as needed., Disp: 100 each, Rfl: 3 .  predniSONE (DELTASONE) 5 MG tablet, Take 2 tablets (10 mg total) by mouth daily with breakfast., Disp: 180 tablet, Rfl: 1 .  vitamin B-12 (CYANOCOBALAMIN) 1000 MCG tablet, Take 1,000 mcg by mouth daily., Disp: , Rfl:  .  carvedilol (COREG) 25 MG tablet, Take 1 tablet (25 mg total) by mouth 2 (two) times daily with a meal., Disp: 180 tablet, Rfl: 3 .  furosemide (LASIX) 20 MG tablet, Take 1 tablet (20 mg total) by mouth daily as needed. Leg swelling, Disp: 90 tablet, Rfl: 1 .  levETIRAcetam (KEPPRA) 500 MG tablet, Take 1 tablet (500 mg total) by mouth 2 (two) times daily., Disp: 14 tablet, Rfl: 0  Social History   Tobacco Use  Smoking Status Never Smoker  Smokeless Tobacco Never Used    Allergies  Allergen Reactions  . Ativan [Lorazepam]     Very confused  . Tramadol Hcl Anxiety and Rash    Headache  .  Amlodipine Besylate Other (See Comments)     dizzy  . Atenolol Other (See Comments)    Fatigue  . Benazepril Cough  . Benicar [Olmesartan Medoxomil] Other (See Comments)    HEADACHE  . Cozaar     nausea  . Hydralazine     Hair loss  . Hydrochlorothiazide W-Triamterene Other (See Comments)     dizzy  . Hydrocodone Other (See Comments)    HEADACHE  . Hydroxyzine Pamoate Other (See Comments)    Per MAR  . Iodine Other (See Comments)    Per MAR  . Lisinopril Other (See Comments) and Cough    Tired & fatigue  . Peach Flavor Itching  . Penicillins Itching    Has patient had a PCN reaction causing immediate rash, facial/tongue/throat swelling, SOB or lightheadedness with hypotension: No Has patient had a PCN reaction causing severe rash involving mucus membranes or skin necrosis: No Has patient had a PCN reaction that required hospitalization No Has  patient had a PCN reaction occurring within the last 10 years: Yes If all of the above answers are "NO", then may proceed with Cephalosporin use.  tolerates cephalosporins OK   . Pravastatin Other (See Comments)    Myalgias-muscle pain  . Prednisolone Nausea Only and Other (See Comments)    Upset stomach  . Strawberry Flavor Itching  . Tramadol Hcl Other (See Comments)    headache  . Benadryl [Diphenhydramine] Itching and Palpitations   Objective:   Vitals:   08/28/18 1111  BP: (!) 173/79  Pulse: (!) 58   There is no height or weight on file to calculate BMI. Constitutional Well developed. Well nourished.  Vascular Dorsalis pedis pulses present 1+ bilaterally  Posterior tibial pulses absent bilaterally  Pedal hair growth diminished. Capillary refill normal to all digits.  No cyanosis or clubbing noted.  Neurologic Normal speech. Oriented to person, place, and time. Epicritic sensation to light touch grossly present bilaterally. Protective sensation with 5.07 monofilament  present bilaterally. Vibratory sensation present bilaterally.  Dermatologic Nails elongated, thickened, dystrophic. No open wounds. No skin lesions.  Orthopedic: Normal joint ROM without pain or crepitus bilaterally. No visible deformities. HAV hammertoes bilat   Assessment:   1. Encounter for diabetic foot exam (HCC)   2. Onychomycosis of multiple toenails with type 2 diabetes mellitus and peripheral angiopathy (HCC)   3. Acquired hallux valgus of both feet   4. Hammer toes of both feet    Plan:  Patient was evaluated and treated and all questions answered.  Diabetes with PAD, Onychomycosis -Educated on diabetic footcare. Diabetic risk level 1 -Nails x10 debrided sharply and manually with large nail nipper and rotary burr.  -Would benefit from DM shoes due to deformities   Procedure: Nail Debridement Rationale: Patient meets criteria for routine foot care due to PAD Type of Debridement:  manual, sharp debridement. Instrumentation: Nail nipper, rotary burr. Number of Nails: 10    Return in about 3 months (around 11/28/2018) for Diabetic Foot Care.

## 2018-12-30 ENCOUNTER — Ambulatory Visit: Payer: Self-pay

## 2018-12-30 NOTE — Telephone Encounter (Signed)
Unable to reach pt. Attempted x 3.

## 2018-12-30 NOTE — Telephone Encounter (Signed)
Attempted to call patient, no answer and memory full.

## 2018-12-30 NOTE — Telephone Encounter (Signed)
Patient called on home number listed, unable to leave VM, recording says to call back, memory full.  Message from [facility] sent at 12/30/2018 11:42 AM EST   Pt stated she needs a Rx for both the head cold and constipation. Pt declined to schedule an appt and requested that prescriptions be sent to [facility] [address], [place] - [address]

## 2018-12-31 NOTE — Telephone Encounter (Signed)
Please advise 

## 2018-12-31 NOTE — Telephone Encounter (Signed)
Advised patient of dr plotnikovs note/instructions, patient repeated back for understanding 

## 2018-12-31 NOTE — Telephone Encounter (Signed)
You can use over-the-counter  "Afrin" nasal spray for nasal congestion as directed. Use " Delsym" or" Robitussin" cough syrup varietis for cough.  You can use plain "Tylenol" for fever, chills and achyness. Use Halls or Ricola cough drops.   "Common cold" symptoms are usually triggered by a virus.  The antibiotics are usually not necessary. On average, a" viral cold" illness would take 4-7 days to resolve.   Please, make an appointment if you are not better or if you're worse.  Use MOM OTC for constipation  OV if not better  Thx

## 2019-01-22 ENCOUNTER — Ambulatory Visit (INDEPENDENT_AMBULATORY_CARE_PROVIDER_SITE_OTHER): Payer: Medicare HMO | Admitting: Internal Medicine

## 2019-01-22 ENCOUNTER — Encounter: Payer: Self-pay | Admitting: Internal Medicine

## 2019-01-22 VITALS — BP 144/82 | HR 63 | Temp 97.4°F | Ht 64.0 in | Wt 184.0 lb

## 2019-01-22 DIAGNOSIS — I700 Atherosclerosis of aorta: Secondary | ICD-10-CM | POA: Diagnosis not present

## 2019-01-22 DIAGNOSIS — E1121 Type 2 diabetes mellitus with diabetic nephropathy: Secondary | ICD-10-CM | POA: Diagnosis not present

## 2019-01-22 DIAGNOSIS — M353 Polymyalgia rheumatica: Secondary | ICD-10-CM

## 2019-01-22 LAB — POCT GLYCOSYLATED HEMOGLOBIN (HGB A1C): HEMOGLOBIN A1C: 6 % — AB (ref 4.0–5.6)

## 2019-01-22 MED ORDER — METHYLPREDNISOLONE ACETATE 80 MG/ML IJ SUSP
80.0000 mg | Freq: Once | INTRAMUSCULAR | Status: AC
  Administered 2019-01-22: 80 mg via INTRAMUSCULAR

## 2019-01-22 MED ORDER — CARVEDILOL 25 MG PO TABS: 25.0000 mg | ORAL_TABLET | Freq: Two times a day (BID) | ORAL | 3 refills | Status: DC

## 2019-01-22 NOTE — Progress Notes (Signed)
Subjective:  Patient ID: Shelby SimmondsHelen G Jefferson, female    DOB: 08/25/1933  Age: 85 y.o. MRN: [id number]  CC: No chief complaint on file.   HPI Shelby SimmondsHelen G Jefferson presents for PMR, OA, HTN, DM f/u  Outpatient Medications Prior to Visit  Medication Sig Dispense Refill   acetaminophen (TYLENOL) 500 MG tablet Take 500 mg by mouth every 6 (six) hours as needed.      Alcohol Swabs (B-D SINGLE USE SWABS REGULAR) PADS Use to clean area to check blood sugars twice a day 300 each 1   aspirin (BAYER ASPIRIN) 325 MG tablet Take 1 tablet (325 mg total) by mouth daily. 100 tablet 3   Blood Glucose Calibration (ACCU-CHEK AVIVA) SOLN Use as directed 3 each 1   carvedilol (COREG) 25 MG tablet Take 1 tablet (25 mg total) by mouth 2 (two) times daily with a meal. 180 tablet 3   cholecalciferol (VITAMIN D) 1000 units tablet Take 1,000 Units by mouth daily.     furosemide (LASIX) 20 MG tablet Take 1 tablet (20 mg total) by mouth daily as needed. Leg swelling 90 tablet 1   glucose blood (ONETOUCH VERIO) test strip Use as instructed 300 each 3   levETIRAcetam (KEPPRA) 500 MG tablet Take 1 tablet (500 mg total) by mouth 2 (two) times daily. 14 tablet 0   ondansetron (ZOFRAN) 4 MG tablet Take 1 tablet (4 mg total) by mouth 2 (two) times daily as needed for nausea or vomiting. 30 tablet 0   ONETOUCH DELICA LANCETS FINE MISC 1 Device by Does not apply route daily as needed. 100 each 3   predniSONE (DELTASONE) 5 MG tablet Take 2 tablets (10 mg total) by mouth daily with breakfast. 180 tablet 1   vitamin B-12 (CYANOCOBALAMIN) 1000 MCG tablet Take 1,000 mcg by mouth daily.     No facility-administered medications prior to visit.     ROS: Review of Systems  Constitutional: Positive for fatigue. Negative for activity change, appetite change, chills and unexpected weight change.  HENT: Negative for congestion, mouth sores and sinus pressure.   Eyes: Negative for visual disturbance.  Respiratory: Negative for  cough, chest tightness and shortness of breath.   Cardiovascular: Negative for chest pain.  Gastrointestinal: Negative for abdominal pain and nausea.  Genitourinary: Negative for difficulty urinating, frequency and vaginal pain.  Musculoskeletal: Positive for arthralgias, back pain, gait problem and neck pain.  Skin: Negative for pallor and rash.  Neurological: Positive for weakness. Negative for dizziness, tremors, numbness and headaches.  Psychiatric/Behavioral: Positive for dysphoric mood. Negative for confusion, sleep disturbance and suicidal ideas. The patient is not nervous/anxious.     Objective:  There were no vitals taken for this visit.  BP Readings from Last 3 Encounters:  10/17/18 (!) 142/82  08/28/18 (!) 173/79  06/23/18 134/72    Wt Readings from Last 3 Encounters:  10/17/18 186 lb (84.4 kg)  06/23/18 185 lb (83.9 kg)  04/03/18 192 lb (87.1 kg)    Physical Exam Constitutional:      General: She is not in acute distress.    Appearance: She is well-developed.  HENT:     Head: Normocephalic.     Right Ear: External ear normal.     Left Ear: External ear normal.     Nose: Nose normal.  Eyes:     General:        Right eye: No discharge.        Left eye: No discharge.  Conjunctiva/sclera: Conjunctivae normal.     Pupils: Pupils are equal, round, and reactive to light.  Neck:     Musculoskeletal: Normal range of motion and neck supple.     Thyroid: No thyromegaly.     Vascular: No JVD.     Trachea: No tracheal deviation.  Cardiovascular:     Rate and Rhythm: Normal rate and regular rhythm.     Heart sounds: Normal heart sounds.  Pulmonary:     Effort: No respiratory distress.     Breath sounds: No stridor. No wheezing.  Abdominal:     General: Bowel sounds are normal. There is no distension.     Palpations: Abdomen is soft. There is no mass.     Tenderness: There is no abdominal tenderness. There is no guarding or rebound.  Musculoskeletal:         General: Tenderness present.     Right lower leg: No edema.     Left lower leg: No edema.  Lymphadenopathy:     Cervical: No cervical adenopathy.  Skin:    Findings: No erythema or rash.  Neurological:     Cranial Nerves: No cranial nerve deficit.     Motor: No abnormal muscle tone.     Coordination: Coordination normal.     Gait: Gait abnormal.     Deep Tendon Reflexes: Reflexes normal.  Psychiatric:        Behavior: Behavior normal.        Thought Content: Thought content normal.        Judgment: Judgment normal.   walker Weak Pain w/ROM - knees, LS spine  Lab Results  Component Value Date   WBC 5.5 03/26/2018   HGB 9.6 (L) 03/26/2018   HCT 30.6 (L) 03/26/2018   PLT 204 03/26/2018   GLUCOSE 100 (H) 10/17/2018   CHOL 169 06/25/2015   TRIG 61 06/25/2015   HDL 57 06/25/2015   LDLDIRECT 114.8 01/10/2011   LDLCALC 100 (H) 06/25/2015   ALT 12 (L) 03/26/2018   AST 24 03/26/2018   NA 138 10/17/2018   K 4.9 10/17/2018   CL 107 10/17/2018   CREATININE 1.21 (H) 10/17/2018   BUN 29 (H) 10/17/2018   CO2 22 10/17/2018   TSH 2.89 12/26/2017   INR 1.09 07/17/2016   HGBA1C 6.7 (H) 10/17/2018    Dg Chest 2 View  Result Date: 03/26/2018 CLINICAL DATA:  Weakness.  Bilateral lower extremity swelling. EXAM: CHEST - 2 VIEW COMPARISON:  11/18/2017 FINDINGS: There is moderate cardiac enlargement. Aortic atherosclerosis identified. No pleural effusion or edema identified. No airspace opacities. Spondylosis noted within the thoracic spine. IMPRESSION: 1. No acute cardiopulmonary abnormalities. 2. Cardiac enlargement 3.  Aortic Atherosclerosis (ICD10-I70.0). Electronically Signed   By: Signa Kell M.D.   On: 03/26/2018 11:58   Ct Chest Wo Contrast  Result Date: 03/26/2018 CLINICAL DATA:  Chronic shortness of breath, lower extremity swelling EXAM: CT CHEST WITHOUT CONTRAST TECHNIQUE: Multidetector CT imaging of the chest was performed following the standard protocol without IV contrast.  COMPARISON:  Chest x-ray of 03/26/2018 FINDINGS: Cardiovascular: The heart is mildly enlarged. No pericardial effusion is seen. Diffuse coronary artery calcifications are noted. Moderate thoracic aortic atherosclerosis is present. The mid ascending thoracic aorta measures 34 mm in diameter Mediastinum/Nodes: No mediastinal or hilar adenopathy is seen. The thyroid gland is unremarkable. No hiatal hernia is seen. Lungs/Pleura: On lung window images, no pneumonia is seen. There is no present CT evidence of congestive heart failure. No pleural effusion is  seen and there is no fluid noted within the fissures. No prominent interstitial markings are evident other than mild chronic change at the lung bases with mild bronchiectatic change particularly in the right lower lobe posterior medially. Small pleural plaques are noted posteriorly on the left without calcification. The central airway is patent. Upper Abdomen: The gallbladder has previously been resected. Common bile duct is dilated but possibly normal post cholecystectomy. Correlate with LFTs. The pancreatic duct is not appear to be dilated. Musculoskeletal: The thoracic vertebrae are in normal alignment with no acute abnormality. There are diffuse degenerative changes throughout the thoracic spine. IMPRESSION: 1. Diffuse coronary artery calcifications and moderate thoracic aortic atherosclerosis. 2. No evidence of congestive heart failure by CT is seen. No pleural effusion is seen and no prominence of interstitial markings is noted. 3. Prominent common bile duct probably post cholecystectomy but correlate with LFTs. Electronically Signed   By: Dwyane Dee M.D.   On: 03/26/2018 14:54    Assessment & Plan:   There are no diagnoses linked to this encounter.   No orders of the defined types were placed in this encounter.    Follow-up: No follow-ups on file.  [name], MD

## 2019-03-16 ENCOUNTER — Telehealth: Payer: Self-pay | Admitting: Internal Medicine

## 2019-03-16 NOTE — Telephone Encounter (Signed)
Copied from CRM [id number]. Topic: Quick Communication - Rx Refill/Question >> Mar 16, 2019 11:07 AM Jens Som A wrote: Medication: levETIRAcetam (KEPPRA) 500 MG tablet [[id number] Patient is requesting 3-4 pills until the medication comes from her mail order. Thank you  Has the patient contacted their pharmacy? Yes  (Agent: If no, request that the patient contact the pharmacy for the refill.) (Agent: If yes, when and what did the pharmacy advise?)  Preferred Pharmacy (with phone number or street name):  [facility] [address] [place], [place] - [address] [phone] (Phone) [phone] (Fax)     Agent: Please be advised that RX refills may take up to 3 business days. We ask that you follow-up with your pharmacy.

## 2019-03-17 MED ORDER — LEVETIRACETAM 500 MG PO TABS: 500.0000 mg | ORAL_TABLET | Freq: Two times a day (BID) | ORAL | 0 refills | Status: DC

## 2019-03-17 NOTE — Telephone Encounter (Signed)
RX sent

## 2019-03-17 NOTE — Telephone Encounter (Signed)
Pt called in and is concerned that she as been off this med for 3 days.     Best number [phone]

## 2019-04-24 ENCOUNTER — Telehealth: Payer: Self-pay | Admitting: Internal Medicine

## 2019-04-24 ENCOUNTER — Ambulatory Visit: Payer: Medicare HMO | Admitting: Internal Medicine

## 2019-04-24 DIAGNOSIS — R300 Dysuria: Secondary | ICD-10-CM

## 2019-04-24 NOTE — Telephone Encounter (Signed)
OK UA Thx 

## 2019-04-24 NOTE — Telephone Encounter (Signed)
Please advise about urine. Would you like pt at be seen or is a med okay?

## 2019-04-24 NOTE — Telephone Encounter (Signed)
Please advise 

## 2019-04-24 NOTE — Telephone Encounter (Signed)
Copied from [place] [id number]. Topic: General - Other >> Apr 24, 2019  8:17 AM Lennox Solders wrote: Reason for CRM:pt is calling and would like to know if its ok for her to use voltaren gel OTC for her knees due to arthritis. Pt cancelled today appointment due to virus. Pt is urinating a lot and urine has a odor. Please advice

## 2019-04-24 NOTE — Telephone Encounter (Signed)
Ok Thx 

## 2019-04-27 NOTE — Telephone Encounter (Signed)
Unable to reach pt

## 2019-05-01 NOTE — Telephone Encounter (Signed)
Patient is calling back to speak to betty regarding voltaren gel and strong urine smell. Please advise thank you. CB- [phone]

## 2019-05-04 NOTE — Telephone Encounter (Signed)
Pt notified and will have someone come collect supplies she will need for UA

## 2019-05-04 NOTE — Addendum Note (Signed)
Addended by: Karren Cobble on: 05/04/2019 04:38 PM   Modules accepted: Orders

## 2019-05-29 ENCOUNTER — Telehealth: Payer: Self-pay | Admitting: Internal Medicine

## 2019-05-29 NOTE — Telephone Encounter (Signed)
Pt informed if she was still having symptoms she would need to provide a sample, pt will have someone come get supplies to get a sample to [place].

## 2019-05-29 NOTE — Telephone Encounter (Signed)
Spoke with [name] today. She would like to know if she still needs to provide a urine sample and if so, does she need to make an appointment. Patient would like for you to give her a call. SF

## 2019-05-29 NOTE — Telephone Encounter (Signed)
Spoke with [name] regarding AWV. Pt stated she will give office a call back to schedule. SF

## 2019-06-19 ENCOUNTER — Ambulatory Visit: Payer: Self-pay | Admitting: *Deleted

## 2019-06-19 NOTE — Telephone Encounter (Signed)
911 called for patient

## 2019-06-19 NOTE — Telephone Encounter (Addendum)
Pt called stating that she woke up this morning, and her heart was beating hard; this has been happening intermittently for the past 2 weeks ago; she says that she gets dizzy when she takes keppra or carvedilol but she is not sure which one; she says that she did not take her keppra last night;she does not have a way of checking her BP or CBG; the pt says that her dizziness does not necessarily occur when her heart is beating fast; she was to have provided a urine sample for culture on 05/29/2019 but her daughter got to the office after it closed; recommendations made per nurse triage protocol; the pt does not want to go to the hospital; she sees Dr Alain Marion, [facility]; pt transferred to [facility] for final disposition.  Reason for Disposition . Difficulty breathing  Answer Assessment - Initial Assessment Questions 1. DESCRIPTION: "Please describe your heart rate or heart beat that you are having" (e.g., fast/slow, regular/irregular, skipped or extra beats, "palpitations")     Beating hard 2. ONSET: "When did it start?" (Minutes, hours or days)     06/19/2019 woke at 0900 3. DURATION: "How long does it last" (e.g., seconds, minutes, hours)   hour 4. PATTERN "Does it come and go, or has it been constant since it started?"  "Does it get worse with exertion?"   "Are you feeling it now?"     intermittently 5. TAP: "Using your hand, can you tap out what you are feeling on a chair or table in front of you, so that I can hear?" (Note: not all patients can do this)       Pt can not complete 6. HEART RATE: "Can you tell me your heart rate?" "How many beats in 15 seconds?"  (Note: not all patients can do this)      Pt can not complete 7. RECURRENT SYMPTOM: "Have you ever had this before?" If so, ask: "When was the last time?" and "What happened that time?"      At least 2 weeks ago 8. CAUSE: "What do you think is causing the palpitations?"     Maybe medication 9. CARDIAC HISTORY: "Do you have any history of heart  disease?" (e.g., heart attack, angina, bypass surgery, angioplasty, arrhythmia)      High blood pressure 10. OTHER SYMPTOMS: "Do you have any other symptoms?" (e.g., dizziness, chest pain, sweating, difficulty breathing)       Dizziness, headache, and frequent urination for a month 11. PREGNANCY: "Is there any chance you are pregnant?" "When was your last menstrual period?"      no  Protocols used: HEART RATE AND HEARTBEAT QUESTIONS-A-AH

## 2019-06-22 ENCOUNTER — Ambulatory Visit: Payer: Medicare Other | Admitting: Internal Medicine

## 2019-06-23 ENCOUNTER — Other Ambulatory Visit: Payer: Medicare HMO

## 2019-06-23 ENCOUNTER — Other Ambulatory Visit: Payer: Self-pay

## 2019-06-23 ENCOUNTER — Encounter: Payer: Self-pay | Admitting: Internal Medicine

## 2019-06-23 ENCOUNTER — Ambulatory Visit (INDEPENDENT_AMBULATORY_CARE_PROVIDER_SITE_OTHER): Payer: Medicare HMO | Admitting: Internal Medicine

## 2019-06-23 VITALS — BP 142/80 | HR 63 | Temp 98.4°F | Ht 64.0 in | Wt 184.0 lb

## 2019-06-23 DIAGNOSIS — M2550 Pain in unspecified joint: Secondary | ICD-10-CM

## 2019-06-23 DIAGNOSIS — F411 Generalized anxiety disorder: Secondary | ICD-10-CM

## 2019-06-23 MED ORDER — DULOXETINE HCL 20 MG PO CPEP: 20.0000 mg | ORAL_CAPSULE | Freq: Every day | ORAL | 0 refills | Status: DC

## 2019-06-23 NOTE — Patient Instructions (Addendum)
We have sent in cymbalta to help with depression/anxiety and pain. Take 1 pill daily and come back to see Dr. Alain Marion in about 1 month to see how it is doing. It can take 2-3 weeks to kick in.  We are checking labs today for rheumatoid arthritis as I do not see this as a problem for you and humira is only for rheumatoid arthritis.

## 2019-06-23 NOTE — Progress Notes (Signed)
   Subjective:   Patient ID: [name], female    DOB: October 22, 1933, 85 y.o.   MRN: [id number]  HPI The patient is an 85 YO female coming in for concerns about multiple reasons including anxiety/depression ([name] has struggled with this over the years, [name] is not motivated, sometimes gets tightness and anxiety in her chest and palpitations in her heart, EMS came out earlier this week and took vitals and did EKG, they did not find anything worrisome, [name] has taken things for this in the past, some had some medication reactions in the past, denies SI/HI) and pain in joints (mostly hands and knees, her daughter is concerned that [name] should be taking humira because [name] knows someone taking it and it helps for her joint pain, they feel [name] has RA and has had it for years, not listed on her chart, taking tylenol for pain only now), and other small concerns of her daughter.  PMH, [facility], social history reviewed and updated.  Review of Systems  Constitutional: Positive for activity change.  HENT: Negative.   Eyes: Negative.   Respiratory: Negative for cough, chest tightness and shortness of breath.   Cardiovascular: Negative for chest pain, palpitations and leg swelling.  Gastrointestinal: Negative for abdominal distention, abdominal pain, constipation, diarrhea, nausea and vomiting.  Musculoskeletal: Positive for arthralgias, joint swelling and myalgias.  Skin: Negative.   Neurological: Negative.   Psychiatric/Behavioral: Positive for decreased concentration and dysphoric mood. Negative for self-injury, sleep disturbance and suicidal ideas. The patient is nervous/anxious.     Objective:  Physical Exam Constitutional:      Appearance: [name] is well-developed. [name] is obese.  HENT:     Head: Normocephalic and atraumatic.  Neck:     Musculoskeletal: Normal range of motion.  Cardiovascular:     Rate and Rhythm: Normal rate and regular rhythm.  Pulmonary:     Effort: Pulmonary effort is normal. No  respiratory distress.     Breath sounds: Normal breath sounds. No wheezing or rales.  Abdominal:     General: Bowel sounds are normal. There is no distension.     Palpations: Abdomen is soft.     Tenderness: There is no abdominal tenderness. There is no rebound.  Musculoskeletal:        General: Tenderness present.  Skin:    General: Skin is warm and dry.  Neurological:     Mental Status: [name] is alert and oriented to person, place, and time.     Coordination: Coordination normal.     Vitals:   06/23/19 1359  BP: (!) 142/80  Pulse: 63  Temp: 98.4 F (36.9 C)  TempSrc: Oral  SpO2: 98%  Weight: 184 lb (83.5 kg)  Height: 5\' 4"  (1.626 m)    Assessment & Plan:  Visit time 40 minutes: greater than 50% of that time was spent in face to face counseling and coordination of care with the patient: counseled about humira and the medical problems that this causes and that I do not see RA as a problem for patient and explained testing and also counseling about her depression and anxiety and treatment options etc.

## 2019-06-24 LAB — ANA,IFA RA DIAG PNL W/RFLX TIT/PATN
Anti Nuclear Antibody (ANA): NEGATIVE
Cyclic Citrullin Peptide Ab: 68 UNITS — ABNORMAL HIGH
Rheumatoid fact SerPl-aCnc: <14 IU/mL (ref <14)

## 2019-06-24 NOTE — Assessment & Plan Note (Addendum)
Checking for RA at request of patient and her daughter. They are fairly insistent that she should take humira and I have counseled them extensively that if she does not have RA then this would not be an option. Advised to continue taking tylenol for now. Will use cymbalta for depression/anxiety to see if this helps with her joint pain at all.

## 2019-06-24 NOTE — Assessment & Plan Note (Signed)
Rx for cymbalta 20 mg daily to see if this can help her anxiety/depression and possibly affect her joint pain.

## 2019-06-26 ENCOUNTER — Other Ambulatory Visit: Payer: Self-pay | Admitting: Internal Medicine

## 2019-06-26 DIAGNOSIS — M069 Rheumatoid arthritis, unspecified: Secondary | ICD-10-CM

## 2019-06-29 ENCOUNTER — Telehealth: Payer: Self-pay | Admitting: Internal Medicine

## 2019-06-29 NOTE — Telephone Encounter (Signed)
Patient checking on the status of rheumatologist referral stating her arthritis is not improving and she's in plan, please advise

## 2019-07-02 DIAGNOSIS — M170 Bilateral primary osteoarthritis of knee: Secondary | ICD-10-CM | POA: Diagnosis not present

## 2019-07-02 DIAGNOSIS — Z79899 Other long term (current) drug therapy: Secondary | ICD-10-CM | POA: Diagnosis not present

## 2019-07-02 DIAGNOSIS — M1990 Unspecified osteoarthritis, unspecified site: Secondary | ICD-10-CM | POA: Diagnosis not present

## 2019-07-02 DIAGNOSIS — M79641 Pain in right hand: Secondary | ICD-10-CM | POA: Diagnosis not present

## 2019-07-02 DIAGNOSIS — M2550 Pain in unspecified joint: Secondary | ICD-10-CM | POA: Diagnosis not present

## 2019-07-02 DIAGNOSIS — M1811 Unilateral primary osteoarthritis of first carpometacarpal joint, right hand: Secondary | ICD-10-CM | POA: Diagnosis not present

## 2019-07-02 DIAGNOSIS — M79672 Pain in left foot: Secondary | ICD-10-CM | POA: Diagnosis not present

## 2019-07-02 DIAGNOSIS — J9811 Atelectasis: Secondary | ICD-10-CM | POA: Diagnosis not present

## 2019-07-02 DIAGNOSIS — M25579 Pain in unspecified ankle and joints of unspecified foot: Secondary | ICD-10-CM | POA: Diagnosis not present

## 2019-07-02 DIAGNOSIS — M19042 Primary osteoarthritis, left hand: Secondary | ICD-10-CM | POA: Diagnosis not present

## 2019-07-02 DIAGNOSIS — M79643 Pain in unspecified hand: Secondary | ICD-10-CM | POA: Diagnosis not present

## 2019-07-02 DIAGNOSIS — M79671 Pain in right foot: Secondary | ICD-10-CM | POA: Diagnosis not present

## 2019-07-02 DIAGNOSIS — M7989 Other specified soft tissue disorders: Secondary | ICD-10-CM | POA: Diagnosis not present

## 2019-07-02 DIAGNOSIS — M0579 Rheumatoid arthritis with rheumatoid factor of multiple sites without organ or systems involvement: Secondary | ICD-10-CM | POA: Diagnosis not present

## 2019-07-02 DIAGNOSIS — M06072 Rheumatoid arthritis without rheumatoid factor, left ankle and foot: Secondary | ICD-10-CM | POA: Diagnosis not present

## 2019-07-02 DIAGNOSIS — M79642 Pain in left hand: Secondary | ICD-10-CM | POA: Diagnosis not present

## 2019-07-02 DIAGNOSIS — M06071 Rheumatoid arthritis without rheumatoid factor, right ankle and foot: Secondary | ICD-10-CM | POA: Diagnosis not present

## 2019-07-07 DIAGNOSIS — D649 Anemia, unspecified: Secondary | ICD-10-CM | POA: Diagnosis not present

## 2019-07-07 DIAGNOSIS — M0579 Rheumatoid arthritis with rheumatoid factor of multiple sites without organ or systems involvement: Secondary | ICD-10-CM | POA: Diagnosis not present

## 2019-07-07 DIAGNOSIS — Z79899 Other long term (current) drug therapy: Secondary | ICD-10-CM | POA: Diagnosis not present

## 2019-07-22 ENCOUNTER — Telehealth: Payer: Self-pay

## 2019-07-22 NOTE — Telephone Encounter (Signed)
Copied from [facility] [id number]. Topic: General - Other >> Jul 22, 2019 12:23 PM Leward Quan A wrote: Reason for CRM: Patient would like a call back from Dr Alain Marion she is wanting to start taking Vitamin D and need approval from the doctor to do this. Can be reached at  Ph# [phone]

## 2019-07-23 ENCOUNTER — Ambulatory Visit: Payer: Medicare HMO | Admitting: Internal Medicine

## 2019-07-23 NOTE — Telephone Encounter (Signed)
Spoke with patient and info given 

## 2019-07-23 NOTE — Telephone Encounter (Signed)
Okay to restart 1000-2000 international units daily.  I did know she stopped it.

## 2019-08-17 ENCOUNTER — Other Ambulatory Visit: Payer: Self-pay

## 2019-08-17 DIAGNOSIS — Z20822 Contact with and (suspected) exposure to covid-19: Secondary | ICD-10-CM

## 2019-08-17 DIAGNOSIS — Z20828 Contact with and (suspected) exposure to other viral communicable diseases: Secondary | ICD-10-CM | POA: Diagnosis not present

## 2019-08-18 ENCOUNTER — Ambulatory Visit (INDEPENDENT_AMBULATORY_CARE_PROVIDER_SITE_OTHER): Payer: Medicare HMO | Admitting: Internal Medicine

## 2019-08-18 ENCOUNTER — Encounter: Payer: Self-pay | Admitting: Internal Medicine

## 2019-08-18 DIAGNOSIS — R410 Disorientation, unspecified: Secondary | ICD-10-CM | POA: Diagnosis not present

## 2019-08-18 DIAGNOSIS — W1800XA Striking against unspecified object with subsequent fall, initial encounter: Secondary | ICD-10-CM

## 2019-08-18 MED ORDER — CARVEDILOL 25 MG PO TABS: 25.0000 mg | ORAL_TABLET | Freq: Two times a day (BID) | ORAL | 3 refills | Status: DC

## 2019-08-18 MED ORDER — PREDNISONE 5 MG PO TABS: 10.0000 mg | ORAL_TABLET | Freq: Every day | ORAL | 1 refills | Status: DC

## 2019-08-18 MED ORDER — FUROSEMIDE 20 MG PO TABS: 20.0000 mg | ORAL_TABLET | Freq: Every day | ORAL | 3 refills | Status: DC | PRN

## 2019-08-18 NOTE — Assessment & Plan Note (Addendum)
Head CT tomorrow The pt declined ER visit

## 2019-08-18 NOTE — Progress Notes (Signed)
Virtual Visit via Video Note  I connected with [name] on 08/18/19 at  3:00 PM EDT by a video enabled telemedicine application and verified that I am speaking with the correct person using two identifiers.Dtr [name] is there   I discussed the limitations of evaluation and management by telemedicine and the availability of in person appointments. The patient expressed understanding and agreed to proceed.  History of Present Illness: C/o  a fall off the bed on Sat - she has been confused since then T 97.6 [name] (dtr) took mother to have a COVID test yesterday...  There has been no runny nose, cough, chest pain, shortness of breath, abdominal pain, diarrhea, skin rashes.   Observations/Objective: The patient appears to be in no acute distress, poor picture. Passively participating in the call.  Assessment and Plan:  See my Assessment and Plan. Follow Up Instructions:    I discussed the assessment and treatment plan with the patient. The patient was provided an opportunity to ask questions and all were answered. The patient agreed with the plan and demonstrated an understanding of the instructions.   The patient was advised to call back or seek an in-person evaluation if the symptoms worsen or if the condition fails to improve as anticipated.  I provided face-to-face time during this encounter. We were at different locations.   Walker Kehr, MD

## 2019-08-18 NOTE — Assessment & Plan Note (Signed)
new ?etiol [place] out of bed on Sat ?LOC - likely not Labs, head CT

## 2019-08-19 ENCOUNTER — Other Ambulatory Visit: Payer: Medicare HMO

## 2019-08-19 ENCOUNTER — Other Ambulatory Visit: Payer: Self-pay | Admitting: Internal Medicine

## 2019-08-19 DIAGNOSIS — W1800XA Striking against unspecified object with subsequent fall, initial encounter: Secondary | ICD-10-CM | POA: Diagnosis not present

## 2019-08-19 DIAGNOSIS — R410 Disorientation, unspecified: Secondary | ICD-10-CM

## 2019-08-19 LAB — NOVEL CORONAVIRUS, NAA: SARS-CoV-2, NAA: NOT DETECTED

## 2019-08-20 ENCOUNTER — Ambulatory Visit (INDEPENDENT_AMBULATORY_CARE_PROVIDER_SITE_OTHER)
Admission: RE | Admit: 2019-08-20 | Discharge: 2019-08-20 | Disposition: A | Discharge status: Home / Self Care | Payer: Medicare HMO | Source: Ambulatory Visit | Attending: Internal Medicine | Admitting: Internal Medicine

## 2019-08-20 ENCOUNTER — Other Ambulatory Visit: Payer: Self-pay

## 2019-08-20 DIAGNOSIS — R410 Disorientation, unspecified: Secondary | ICD-10-CM

## 2019-08-20 DIAGNOSIS — W1800XA Striking against unspecified object with subsequent fall, initial encounter: Secondary | ICD-10-CM

## 2019-08-20 DIAGNOSIS — R4182 Altered mental status, unspecified: Secondary | ICD-10-CM | POA: Diagnosis not present

## 2019-08-21 LAB — CULTURE, URINE COMPREHENSIVE
MICRO NUMBER:: 964391
SPECIMEN QUALITY:: ADEQUATE

## 2019-08-22 ENCOUNTER — Other Ambulatory Visit: Payer: Self-pay | Admitting: Internal Medicine

## 2019-08-22 MED ORDER — CIPROFLOXACIN HCL 250 MG PO TABS: 250.0000 mg | ORAL_TABLET | Freq: Two times a day (BID) | ORAL | 0 refills | Status: DC

## 2019-08-25 ENCOUNTER — Encounter: Payer: Self-pay | Admitting: Internal Medicine

## 2019-08-25 ENCOUNTER — Other Ambulatory Visit: Payer: Self-pay

## 2019-08-25 ENCOUNTER — Ambulatory Visit: Payer: Self-pay | Admitting: Internal Medicine

## 2019-08-25 ENCOUNTER — Ambulatory Visit (INDEPENDENT_AMBULATORY_CARE_PROVIDER_SITE_OTHER): Payer: Medicare HMO | Admitting: Internal Medicine

## 2019-08-25 VITALS — BP 136/84 | HR 87 | Temp 98.8°F | Ht 64.0 in | Wt 174.0 lb

## 2019-08-25 DIAGNOSIS — R531 Weakness: Secondary | ICD-10-CM

## 2019-08-25 DIAGNOSIS — R569 Unspecified convulsions: Secondary | ICD-10-CM | POA: Diagnosis not present

## 2019-08-25 DIAGNOSIS — N390 Urinary tract infection, site not specified: Secondary | ICD-10-CM | POA: Diagnosis not present

## 2019-08-25 DIAGNOSIS — E538 Deficiency of other specified B group vitamins: Secondary | ICD-10-CM

## 2019-08-25 DIAGNOSIS — M353 Polymyalgia rheumatica: Secondary | ICD-10-CM | POA: Diagnosis not present

## 2019-08-25 DIAGNOSIS — Z23 Encounter for immunization: Secondary | ICD-10-CM | POA: Diagnosis not present

## 2019-08-25 MED ORDER — METHYLPREDNISOLONE ACETATE 80 MG/ML IJ SUSP
80.0000 mg | Freq: Once | INTRAMUSCULAR | Status: AC
  Administered 2019-08-25: 17:00:00 80 mg via INTRAMUSCULAR

## 2019-08-25 MED ORDER — CYANOCOBALAMIN 1000 MCG/ML IJ SOLN
1000.0000 ug | Freq: Once | INTRAMUSCULAR | Status: AC
  Administered 2019-08-25: 17:00:00 1000 ug via INTRAMUSCULAR

## 2019-08-25 MED ORDER — CYCLOBENZAPRINE HCL 5 MG PO TABS: 5.0000 mg | ORAL_TABLET | Freq: Every evening | ORAL | 3 refills | Status: DC | PRN

## 2019-08-25 NOTE — Progress Notes (Signed)
Subjective:  Patient ID: [name], female    DOB: 10-17-1933  Age: 85 y.o. MRN: [id number]  CC: No chief complaint on file.   HPI [name] presents for weakness, feeling bad all the time, achy. She started Cipro for a UTI  Outpatient Medications Prior to Visit  Medication Sig Dispense Refill  . acetaminophen (TYLENOL) 500 MG tablet Take 500 mg by mouth every 6 (six) hours as needed.     . Alcohol Swabs (B-D SINGLE USE SWABS REGULAR) PADS Use to clean area to check blood sugars twice a day 300 each 1  . aspirin (BAYER ASPIRIN) 325 MG tablet Take 1 tablet (325 mg total) by mouth daily. 100 tablet 3  . Blood Glucose Calibration (ACCU-CHEK AVIVA) SOLN Use as directed 3 each 1  . carvedilol (COREG) 25 MG tablet Take 1 tablet (25 mg total) by mouth 2 (two) times daily with a meal. 180 tablet 3  . cholecalciferol (VITAMIN D) 1000 units tablet Take 1,000 Units by mouth daily.    . ciprofloxacin (CIPRO) 250 MG tablet Take 1 tablet (250 mg total) by mouth 2 (two) times daily. 14 tablet 0  . DULoxetine (CYMBALTA) 20 MG capsule Take 1 capsule (20 mg total) by mouth daily. 90 capsule 0  . furosemide (LASIX) 20 MG tablet Take 1 tablet (20 mg total) by mouth daily as needed. Leg swelling 90 tablet 3  . glucose blood (ONETOUCH VERIO) test strip Use as instructed 300 each 3  . levETIRAcetam (KEPPRA) 500 MG tablet Take 1 tablet (500 mg total) by mouth 2 (two) times daily. 14 tablet 0  . ondansetron (ZOFRAN) 4 MG tablet TAKE 1 TABLET BY MOUTH TWICE DAILY AS NEEDED FOR NAUSEA FOR VOMITING 30 tablet 0  . ONETOUCH DELICA LANCETS FINE MISC 1 Device by Does not apply route daily as needed. 100 each 3  . predniSONE (DELTASONE) 5 MG tablet Take 2 tablets (10 mg total) by mouth daily with breakfast. 180 tablet 1  . vitamin B-12 (CYANOCOBALAMIN) 1000 MCG tablet Take 1,000 mcg by mouth daily.     No facility-administered medications prior to visit.     ROS: Review of Systems  Constitutional:  Positive for fatigue. Negative for activity change, appetite change, chills and unexpected weight change.  HENT: Negative for congestion, mouth sores and sinus pressure.   Eyes: Negative for visual disturbance.  Respiratory: Negative for cough and chest tightness.   Gastrointestinal: Negative for abdominal pain and nausea.  Genitourinary: Negative for difficulty urinating, frequency and vaginal pain.  Musculoskeletal: Positive for arthralgias, back pain and gait problem.  Skin: Negative for pallor and rash.  Neurological: Positive for weakness. Negative for dizziness, tremors, numbness and headaches.  Psychiatric/Behavioral: Positive for decreased concentration. Negative for confusion, sleep disturbance and suicidal ideas. The patient is nervous/anxious.     Objective:  BP 136/84 (BP Location: Left Arm, Patient Position: Sitting, Cuff Size: Normal)   Pulse 87   Temp 98.8 F (37.1 C) (Oral)   Ht 5\' 4"  (1.626 m)   Wt 174 lb (78.9 kg)   SpO2 97%   BMI 29.87 kg/m   BP Readings from Last 3 Encounters:  08/25/19 136/84  06/23/19 (!) 142/80  01/22/19 (!) 144/82    Wt Readings from Last 3 Encounters:  08/25/19 174 lb (78.9 kg)  06/23/19 184 lb (83.5 kg)  01/22/19 184 lb (83.5 kg)    Physical Exam Constitutional:      General: She is not in acute distress.  Appearance: She is well-developed. She is obese.  HENT:     Head: Normocephalic.     Right Ear: External ear normal.     Left Ear: External ear normal.     Nose: Nose normal.  Eyes:     General:        Right eye: No discharge.        Left eye: No discharge.     Conjunctiva/sclera: Conjunctivae normal.     Pupils: Pupils are equal, round, and reactive to light.  Neck:     Musculoskeletal: Normal range of motion and neck supple.     Thyroid: No thyromegaly.     Vascular: No JVD.     Trachea: No tracheal deviation.  Cardiovascular:     Rate and Rhythm: Normal rate and regular rhythm.     Heart sounds: Normal heart  sounds.  Pulmonary:     Effort: No respiratory distress.     Breath sounds: No stridor. No wheezing.  Abdominal:     General: Bowel sounds are normal. There is no distension.     Palpations: Abdomen is soft. There is no mass.     Tenderness: There is no abdominal tenderness. There is no guarding or rebound.  Musculoskeletal:        General: Tenderness present.  Lymphadenopathy:     Cervical: No cervical adenopathy.  Skin:    Findings: No erythema or rash.  Neurological:     Mental Status: She is oriented to person, place, and time.     Cranial Nerves: No cranial nerve deficit.     Motor: No abnormal muscle tone.     Coordination: Coordination abnormal.     Gait: Gait abnormal.     Deep Tendon Reflexes: Reflexes normal.  Psychiatric:        Behavior: Behavior normal.        Thought Content: Thought content normal.        Judgment: Judgment normal.   painful muscles, joints walker  Lab Results  Component Value Date   WBC 5.5 03/26/2018   HGB 9.6 (L) 03/26/2018   HCT 30.6 (L) 03/26/2018   PLT 204 03/26/2018   GLUCOSE 100 (H) 10/17/2018   CHOL 169 06/25/2015   TRIG 61 06/25/2015   HDL 57 06/25/2015   LDLDIRECT 114.8 01/10/2011   LDLCALC 100 (H) 06/25/2015   ALT 12 (L) 03/26/2018   AST 24 03/26/2018   NA 138 10/17/2018   K 4.9 10/17/2018   CL 107 10/17/2018   CREATININE 1.21 (H) 10/17/2018   BUN 29 (H) 10/17/2018   CO2 22 10/17/2018   TSH 2.89 12/26/2017   INR 1.09 07/17/2016   HGBA1C 6.0 (A) 01/22/2019    Ct Head Wo Contrast  Result Date: 08/21/2019 CLINICAL DATA:  Altered mental status, recent fall EXAM: CT HEAD WITHOUT CONTRAST TECHNIQUE: Contiguous axial images were obtained from the base of the skull through the vertex without intravenous contrast. COMPARISON:  07/17/2016 FINDINGS: Brain: No evidence of acute infarction, hemorrhage, hydrocephalus, extra-axial collection or mass lesion/mass effect. Periventricular white matter hypodensity. Vascular: No  hyperdense vessel or unexpected calcification. Skull: Normal. Negative for fracture or focal lesion. Sinuses/Orbits: No acute finding. Other: None. IMPRESSION: No acute intracranial pathology.  Small-vessel white matter disease. Electronically Signed   By: Lauralyn Primes M.D.   On: 08/21/2019 08:35    Assessment & Plan:   There are no diagnoses linked to this encounter.   No orders of the defined types were placed in this  encounter.    Follow-up: No follow-ups on file.  Walker Kehr, MD

## 2019-08-25 NOTE — Assessment & Plan Note (Signed)
On B12 

## 2019-08-25 NOTE — Assessment & Plan Note (Signed)
Klebsiella - Cipro

## 2019-08-25 NOTE — Assessment & Plan Note (Signed)
Chronic PMR/FMS/CFS Low dose prednisone

## 2019-08-25 NOTE — Assessment & Plan Note (Signed)
Keppra

## 2019-08-25 NOTE — Assessment & Plan Note (Signed)
Chronic PMR/FMS/CFS Low dose prednisone Depo-Medrol IM

## 2019-08-25 NOTE — Telephone Encounter (Signed)
Pt is being seen today 

## 2019-08-25 NOTE — Addendum Note (Signed)
Addended by: Karren Cobble on: 08/25/2019 04:50 PM   Modules accepted: Orders

## 2019-08-25 NOTE — Telephone Encounter (Signed)
Pt reports frequent episodes of constipation "For a long time I get this way." States "I get SOB until I make myself vomit and then have a BM." Reports abdominal cramping at times, none presently. Reports does not take any laxatives PO, gives herself an enema. States did enema this AM, had BM and "Feel better." States stools are always hard, small amount of bright red bleeding at times, "If my rectum is tight." States "I just want to know why this happens and what I can do." Requests appt with Dr. Alain Marion to discuss. Care advise gien per protocol. Call transferred to practice, Gareth Eagle, pt hung up during transfer or call was dropped. Gareth Eagle states she will CB pt.  Reason for Disposition . Unable to have a bowel movement (BM) without laxative or enema  Answer Assessment - Initial Assessment Questions 1. STOOL PATTERN OR FREQUENCY: "How often do you pass bowel movements (BMs)?"  (Normal range: tid to q 3 days)  "When was the last BM passed?"       Varies 2. STRAINING: "Do you have to strain to have a BM?"      yes 3. RECTAL PAIN: "Does your rectum hurt when the stool comes out?" If so, ask: "Do you have hemorrhoids? How bad is the pain?"  (Scale 1-10; or mild, moderate, severe)     At times 4. STOOL COMPOSITION: "Are the stools hard?"      yes 5. BLOOD ON STOOLS: "Has there been any blood on the toilet tissue or on the surface of the BM?" If so, ask: "When was the last time?"      "A little bit  if it's hard." Bright red 6. CHRONIC CONSTIPATION: "Is this a new problem for you?"  If no, ask: "How long have you had this problem?" (days, weeks, months)      "A long time" 7. CHANGES IN DIET: "Have there been any recent changes in your diet?"     "I probably don't eat right." 8. MEDICATIONS: "Have you been taking any new medications?"     no 9. LAXATIVES: "Have you been using any laxatives or enemas?"  If yes, ask "What, how often, and when was the last time?"     "I take an enema." 10. CAUSE: "What  do you think is causing the constipation?"         11. OTHER SYMPTOMS: "Do you have any other symptoms?" (e.g., abdominal pain, fever, vomiting)       SOB "Until I make myself vomit and have a BM."  Protocols used: CONSTIPATION-A-AH

## 2019-08-27 ENCOUNTER — Other Ambulatory Visit: Payer: Self-pay | Admitting: Internal Medicine

## 2019-09-03 ENCOUNTER — Other Ambulatory Visit: Payer: Self-pay | Admitting: Internal Medicine

## 2019-09-18 ENCOUNTER — Other Ambulatory Visit: Payer: Self-pay | Admitting: Internal Medicine

## 2019-10-16 ENCOUNTER — Emergency Department (HOSPITAL_COMMUNITY): Payer: Medicare HMO

## 2019-10-16 ENCOUNTER — Encounter (HOSPITAL_COMMUNITY)
Admission: EM | Disposition: A | Discharge status: Home / Self Care | Payer: Self-pay | Source: Home / Self Care | Attending: Cardiology

## 2019-10-16 ENCOUNTER — Other Ambulatory Visit: Payer: Self-pay

## 2019-10-16 ENCOUNTER — Inpatient Hospital Stay (HOSPITAL_COMMUNITY)
Admission: EM | Admit: 2019-10-16 | Discharge: 2019-10-17 | DRG: 247 | Disposition: A | Discharge status: Home / Self Care | Payer: Medicare HMO | Attending: Cardiology | Admitting: Cardiology

## 2019-10-16 DIAGNOSIS — Z90710 Acquired absence of both cervix and uterus: Secondary | ICD-10-CM | POA: Diagnosis not present

## 2019-10-16 DIAGNOSIS — D509 Iron deficiency anemia, unspecified: Secondary | ICD-10-CM | POA: Diagnosis present

## 2019-10-16 DIAGNOSIS — E1121 Type 2 diabetes mellitus with diabetic nephropathy: Secondary | ICD-10-CM | POA: Diagnosis not present

## 2019-10-16 DIAGNOSIS — Z91018 Allergy to other foods: Secondary | ICD-10-CM

## 2019-10-16 DIAGNOSIS — Z86718 Personal history of other venous thrombosis and embolism: Secondary | ICD-10-CM | POA: Diagnosis not present

## 2019-10-16 DIAGNOSIS — Z888 Allergy status to other drugs, medicaments and biological substances status: Secondary | ICD-10-CM

## 2019-10-16 DIAGNOSIS — I25118 Atherosclerotic heart disease of native coronary artery with other forms of angina pectoris: Secondary | ICD-10-CM | POA: Diagnosis not present

## 2019-10-16 DIAGNOSIS — M353 Polymyalgia rheumatica: Secondary | ICD-10-CM | POA: Diagnosis present

## 2019-10-16 DIAGNOSIS — D638 Anemia in other chronic diseases classified elsewhere: Secondary | ICD-10-CM | POA: Diagnosis present

## 2019-10-16 DIAGNOSIS — E538 Deficiency of other specified B group vitamins: Secondary | ICD-10-CM | POA: Diagnosis present

## 2019-10-16 DIAGNOSIS — E785 Hyperlipidemia, unspecified: Secondary | ICD-10-CM | POA: Diagnosis not present

## 2019-10-16 DIAGNOSIS — Z9049 Acquired absence of other specified parts of digestive tract: Secondary | ICD-10-CM | POA: Diagnosis not present

## 2019-10-16 DIAGNOSIS — Z955 Presence of coronary angioplasty implant and graft: Secondary | ICD-10-CM

## 2019-10-16 DIAGNOSIS — G8929 Other chronic pain: Secondary | ICD-10-CM | POA: Diagnosis present

## 2019-10-16 DIAGNOSIS — I214 Non-ST elevation (NSTEMI) myocardial infarction: Secondary | ICD-10-CM | POA: Diagnosis not present

## 2019-10-16 DIAGNOSIS — Z20828 Contact with and (suspected) exposure to other viral communicable diseases: Secondary | ICD-10-CM | POA: Diagnosis present

## 2019-10-16 DIAGNOSIS — Z7952 Long term (current) use of systemic steroids: Secondary | ICD-10-CM

## 2019-10-16 DIAGNOSIS — R079 Chest pain, unspecified: Secondary | ICD-10-CM | POA: Diagnosis not present

## 2019-10-16 DIAGNOSIS — M810 Age-related osteoporosis without current pathological fracture: Secondary | ICD-10-CM | POA: Diagnosis present

## 2019-10-16 DIAGNOSIS — I712 Thoracic aortic aneurysm, without rupture: Secondary | ICD-10-CM | POA: Diagnosis present

## 2019-10-16 DIAGNOSIS — E559 Vitamin D deficiency, unspecified: Secondary | ICD-10-CM | POA: Diagnosis present

## 2019-10-16 DIAGNOSIS — I25110 Atherosclerotic heart disease of native coronary artery with unstable angina pectoris: Secondary | ICD-10-CM | POA: Diagnosis present

## 2019-10-16 DIAGNOSIS — I10 Essential (primary) hypertension: Secondary | ICD-10-CM | POA: Diagnosis present

## 2019-10-16 DIAGNOSIS — M109 Gout, unspecified: Secondary | ICD-10-CM | POA: Diagnosis present

## 2019-10-16 DIAGNOSIS — Z8673 Personal history of transient ischemic attack (TIA), and cerebral infarction without residual deficits: Secondary | ICD-10-CM

## 2019-10-16 DIAGNOSIS — I361 Nonrheumatic tricuspid (valve) insufficiency: Secondary | ICD-10-CM | POA: Diagnosis not present

## 2019-10-16 DIAGNOSIS — I4510 Unspecified right bundle-branch block: Secondary | ICD-10-CM

## 2019-10-16 DIAGNOSIS — I340 Nonrheumatic mitral (valve) insufficiency: Secondary | ICD-10-CM | POA: Diagnosis not present

## 2019-10-16 DIAGNOSIS — K219 Gastro-esophageal reflux disease without esophagitis: Secondary | ICD-10-CM | POA: Diagnosis present

## 2019-10-16 DIAGNOSIS — Z833 Family history of diabetes mellitus: Secondary | ICD-10-CM

## 2019-10-16 DIAGNOSIS — Z6829 Body mass index (BMI) 29.0-29.9, adult: Secondary | ICD-10-CM

## 2019-10-16 DIAGNOSIS — R0902 Hypoxemia: Secondary | ICD-10-CM | POA: Diagnosis not present

## 2019-10-16 DIAGNOSIS — Z79899 Other long term (current) drug therapy: Secondary | ICD-10-CM

## 2019-10-16 DIAGNOSIS — Z880 Allergy status to penicillin: Secondary | ICD-10-CM

## 2019-10-16 DIAGNOSIS — Z885 Allergy status to narcotic agent status: Secondary | ICD-10-CM

## 2019-10-16 DIAGNOSIS — Z209 Contact with and (suspected) exposure to unspecified communicable disease: Secondary | ICD-10-CM | POA: Diagnosis not present

## 2019-10-16 DIAGNOSIS — R0789 Other chest pain: Secondary | ICD-10-CM | POA: Diagnosis not present

## 2019-10-16 DIAGNOSIS — F419 Anxiety disorder, unspecified: Secondary | ICD-10-CM | POA: Diagnosis present

## 2019-10-16 DIAGNOSIS — I200 Unstable angina: Secondary | ICD-10-CM | POA: Diagnosis present

## 2019-10-16 DIAGNOSIS — E669 Obesity, unspecified: Secondary | ICD-10-CM | POA: Diagnosis present

## 2019-10-16 DIAGNOSIS — I454 Nonspecific intraventricular block: Secondary | ICD-10-CM | POA: Diagnosis not present

## 2019-10-16 DIAGNOSIS — Z8249 Family history of ischemic heart disease and other diseases of the circulatory system: Secondary | ICD-10-CM

## 2019-10-16 DIAGNOSIS — F329 Major depressive disorder, single episode, unspecified: Secondary | ICD-10-CM | POA: Diagnosis present

## 2019-10-16 HISTORY — PX: LEFT HEART CATH AND CORONARY ANGIOGRAPHY: CATH118249

## 2019-10-16 HISTORY — PX: CORONARY STENT INTERVENTION: CATH118234

## 2019-10-16 LAB — GLUCOSE, CAPILLARY
Glucose-Capillary: 146 mg/dL — ABNORMAL HIGH (ref 70–99)
Glucose-Capillary: 156 mg/dL — ABNORMAL HIGH (ref 70–99)

## 2019-10-16 LAB — CBG MONITORING, ED: Glucose-Capillary: 147 mg/dL — ABNORMAL HIGH (ref 70–99)

## 2019-10-16 LAB — URINALYSIS, ROUTINE W REFLEX MICROSCOPIC
Bilirubin Urine: NEGATIVE
Glucose, UA: NEGATIVE mg/dL
Hgb urine dipstick: NEGATIVE
Ketones, ur: NEGATIVE mg/dL
Leukocytes,Ua: NEGATIVE
Nitrite: NEGATIVE
Protein, ur: 30 mg/dL — AB
Specific Gravity, Urine: 1.01 (ref 1.005–1.030)
pH: 6 (ref 5.0–8.0)

## 2019-10-16 LAB — POC SARS CORONAVIRUS 2 AG -  ED: SARS Coronavirus 2 Ag: NEGATIVE

## 2019-10-16 LAB — POCT ACTIVATED CLOTTING TIME
Activated Clotting Time: 274 seconds
Activated Clotting Time: 329 seconds
Activated Clotting Time: 219 seconds
Activated Clotting Time: 202 seconds
Activated Clotting Time: 191 seconds

## 2019-10-16 LAB — CBC WITH DIFFERENTIAL/PLATELET
Abs Immature Granulocytes: 0.03 10*3/uL (ref 0.00–0.07)
Basophils Absolute: 0 10*3/uL (ref 0.0–0.1)
Basophils Relative: 0 %
Eosinophils Absolute: 0.1 10*3/uL (ref 0.0–0.5)
Eosinophils Relative: 2 %
HCT: 33 % — ABNORMAL LOW (ref 36.0–46.0)
Hemoglobin: 10.3 g/dL — ABNORMAL LOW (ref 12.0–15.0)
Immature Granulocytes: 0 %
Lymphocytes Relative: 19 %
Lymphs Abs: 1.3 10*3/uL (ref 0.7–4.0)
MCH: 29.6 pg (ref 26.0–34.0)
MCHC: 31.2 g/dL (ref 30.0–36.0)
MCV: 94.8 fL (ref 80.0–100.0)
Monocytes Absolute: 0.6 10*3/uL (ref 0.1–1.0)
Monocytes Relative: 9 %
Neutro Abs: 5 10*3/uL (ref 1.7–7.7)
Neutrophils Relative %: 70 %
Platelets: 162 10*3/uL (ref 150–400)
RBC: 3.48 MIL/uL — ABNORMAL LOW (ref 3.87–5.11)
RDW: 12.6 % (ref 11.5–15.5)
WBC: 7.1 10*3/uL (ref 4.0–10.5)
nRBC: 0 % (ref 0.0–0.2)

## 2019-10-16 LAB — BASIC METABOLIC PANEL
Anion gap: 10 (ref 5–15)
BUN: 20 mg/dL (ref 8–23)
CO2: 21 mmol/L — ABNORMAL LOW (ref 22–32)
Calcium: 9.9 mg/dL (ref 8.9–10.3)
Chloride: 106 mmol/L (ref 98–111)
Creatinine, Ser: 1.14 mg/dL — ABNORMAL HIGH (ref 0.44–1.00)
GFR calc Af Amer: 51 mL/min — ABNORMAL LOW (ref >60)
GFR calc non Af Amer: 44 mL/min — ABNORMAL LOW (ref >60)
Glucose, Bld: 116 mg/dL — ABNORMAL HIGH (ref 70–99)
Potassium: 4.5 mmol/L (ref 3.5–5.1)
Sodium: 137 mmol/L (ref 135–145)

## 2019-10-16 LAB — TROPONIN I (HIGH SENSITIVITY)
Troponin I (High Sensitivity): 1048 ng/L (ref <18)
Troponin I (High Sensitivity): 3696 ng/L (ref <18)

## 2019-10-16 LAB — SARS CORONAVIRUS 2 (TAT 6-24 HRS): SARS Coronavirus 2: NEGATIVE

## 2019-10-16 SURGERY — LEFT HEART CATH AND CORONARY ANGIOGRAPHY
Anesthesia: LOCAL

## 2019-10-16 MED ORDER — LABETALOL HCL 5 MG/ML IV SOLN
INTRAVENOUS | Status: DC | PRN
  Administered 2019-10-16 (×3): 10 mg via INTRAVENOUS

## 2019-10-16 MED ORDER — SODIUM CHLORIDE 0.9 % IV SOLN: INTRAVENOUS | Status: DC

## 2019-10-16 MED ORDER — PREDNISONE 10 MG PO TABS
10.0000 mg | ORAL_TABLET | Freq: Every day | ORAL | Status: DC
  Administered 2019-10-17: 10 mg via ORAL
  Filled 2019-10-16: qty 1

## 2019-10-16 MED ORDER — ONDANSETRON HCL 4 MG/2ML IJ SOLN: 4.0000 mg | Freq: Four times a day (QID) | INTRAMUSCULAR | Status: DC | PRN

## 2019-10-16 MED ORDER — VITAMIN B-12 1000 MCG PO TABS
1000.0000 ug | ORAL_TABLET | Freq: Every day | ORAL | Status: DC
  Administered 2019-10-17: 1000 ug via ORAL
  Filled 2019-10-16: qty 1

## 2019-10-16 MED ORDER — MIDAZOLAM HCL 2 MG/2ML IJ SOLN
INTRAMUSCULAR | Status: DC | PRN
  Administered 2019-10-16: 1 mg via INTRAVENOUS

## 2019-10-16 MED ORDER — HEPARIN BOLUS VIA INFUSION
4000.0000 [IU] | Freq: Once | INTRAVENOUS | Status: DC
  Filled 2019-10-16: qty 4000

## 2019-10-16 MED ORDER — IOHEXOL 350 MG/ML SOLN
INTRAVENOUS | Status: DC | PRN
  Administered 2019-10-16: 150 mL via INTRA_ARTERIAL

## 2019-10-16 MED ORDER — NITROGLYCERIN 1 MG/10 ML FOR IR/CATH LAB
INTRA_ARTERIAL | Status: AC
  Filled 2019-10-16: qty 10

## 2019-10-16 MED ORDER — CLOPIDOGREL BISULFATE 300 MG PO TABS
ORAL_TABLET | ORAL | Status: DC | PRN
  Administered 2019-10-16: 600 mg via ORAL

## 2019-10-16 MED ORDER — LABETALOL HCL 5 MG/ML IV SOLN
INTRAVENOUS | Status: AC
  Filled 2019-10-16: qty 4

## 2019-10-16 MED ORDER — ONDANSETRON HCL 4 MG/2ML IJ SOLN
INTRAMUSCULAR | Status: AC
  Filled 2019-10-16: qty 2

## 2019-10-16 MED ORDER — LIDOCAINE HCL (PF) 1 % IJ SOLN
INTRAMUSCULAR | Status: AC
  Filled 2019-10-16: qty 30

## 2019-10-16 MED ORDER — HYDRALAZINE HCL 20 MG/ML IJ SOLN: 10.0000 mg | INTRAMUSCULAR | Status: AC | PRN

## 2019-10-16 MED ORDER — NITROGLYCERIN 1 MG/10 ML FOR IR/CATH LAB
INTRA_ARTERIAL | Status: DC | PRN
  Administered 2019-10-16: 200 ug via INTRACORONARY
  Administered 2019-10-16: 300 ug via INTRACORONARY
  Administered 2019-10-16: 200 ug via INTRACORONARY

## 2019-10-16 MED ORDER — NITROGLYCERIN IN D5W 200-5 MCG/ML-% IV SOLN
INTRAVENOUS | Status: AC
  Filled 2019-10-16: qty 250

## 2019-10-16 MED ORDER — MIDAZOLAM HCL 2 MG/2ML IJ SOLN
INTRAMUSCULAR | Status: AC
  Filled 2019-10-16: qty 2

## 2019-10-16 MED ORDER — ACETAMINOPHEN 500 MG PO TABS: 500.0000 mg | ORAL_TABLET | Freq: Four times a day (QID) | ORAL | Status: DC | PRN

## 2019-10-16 MED ORDER — ONDANSETRON HCL 4 MG/2ML IJ SOLN
INTRAMUSCULAR | Status: DC | PRN
  Administered 2019-10-16: 4 mg via INTRAVENOUS

## 2019-10-16 MED ORDER — METHYLPREDNISOLONE SODIUM SUCC 125 MG IJ SOLR
INTRAMUSCULAR | Status: DC | PRN
  Administered 2019-10-16: 125 mg via INTRAVENOUS

## 2019-10-16 MED ORDER — METHYLPREDNISOLONE SODIUM SUCC 125 MG IJ SOLR
INTRAMUSCULAR | Status: AC
  Filled 2019-10-16: qty 2

## 2019-10-16 MED ORDER — ACETAMINOPHEN 325 MG PO TABS
ORAL_TABLET | ORAL | Status: AC
  Filled 2019-10-16: qty 2

## 2019-10-16 MED ORDER — SODIUM CHLORIDE 0.9% FLUSH: 3.0000 mL | Freq: Two times a day (BID) | INTRAVENOUS | Status: DC

## 2019-10-16 MED ORDER — NITROGLYCERIN IN D5W 200-5 MCG/ML-% IV SOLN: 0.0000 ug/min | INTRAVENOUS | Status: DC

## 2019-10-16 MED ORDER — ASPIRIN 300 MG RE SUPP: 300.0000 mg | RECTAL | Status: AC

## 2019-10-16 MED ORDER — INSULIN ASPART 100 UNIT/ML ~~LOC~~ SOLN
0.0000 [IU] | Freq: Three times a day (TID) | SUBCUTANEOUS | Status: DC
  Administered 2019-10-17: 1 [IU] via SUBCUTANEOUS

## 2019-10-16 MED ORDER — NITROGLYCERIN 0.4 MG SL SUBL
0.4000 mg | SUBLINGUAL_TABLET | SUBLINGUAL | Status: DC | PRN
  Administered 2019-10-16 (×2): 0.4 mg via SUBLINGUAL
  Filled 2019-10-16: qty 1

## 2019-10-16 MED ORDER — SODIUM CHLORIDE 0.9 % IV SOLN: INTRAVENOUS | Status: AC

## 2019-10-16 MED ORDER — ASPIRIN 81 MG PO CHEW: 81.0000 mg | CHEWABLE_TABLET | Freq: Every day | ORAL | Status: DC

## 2019-10-16 MED ORDER — ACETAMINOPHEN 325 MG PO TABS
650.0000 mg | ORAL_TABLET | Freq: Once | ORAL | Status: AC
  Administered 2019-10-16: 650 mg via ORAL
  Filled 2019-10-16: qty 2

## 2019-10-16 MED ORDER — ATORVASTATIN CALCIUM 40 MG PO TABS: 40.0000 mg | ORAL_TABLET | Freq: Every day | ORAL | Status: DC

## 2019-10-16 MED ORDER — ONDANSETRON HCL 4 MG PO TABS: 4.0000 mg | ORAL_TABLET | Freq: Three times a day (TID) | ORAL | Status: DC | PRN

## 2019-10-16 MED ORDER — ASPIRIN EC 81 MG PO TBEC
81.0000 mg | DELAYED_RELEASE_TABLET | Freq: Every day | ORAL | Status: DC
  Administered 2019-10-17: 81 mg via ORAL
  Filled 2019-10-16: qty 1

## 2019-10-16 MED ORDER — DULOXETINE HCL 20 MG PO CPEP
20.0000 mg | ORAL_CAPSULE | Freq: Every day | ORAL | Status: DC
  Administered 2019-10-17: 20 mg via ORAL
  Filled 2019-10-16: qty 1

## 2019-10-16 MED ORDER — HEPARIN SODIUM (PORCINE) 1000 UNIT/ML IJ SOLN
INTRAMUSCULAR | Status: DC | PRN
  Administered 2019-10-16: 2000 [IU] via INTRAVENOUS
  Administered 2019-10-16: 6000 [IU] via INTRAVENOUS

## 2019-10-16 MED ORDER — NITROGLYCERIN 0.4 MG SL SUBL: 0.4000 mg | SUBLINGUAL_TABLET | SUBLINGUAL | Status: DC | PRN

## 2019-10-16 MED ORDER — LABETALOL HCL 5 MG/ML IV SOLN: 10.0000 mg | INTRAVENOUS | Status: AC | PRN

## 2019-10-16 MED ORDER — SODIUM CHLORIDE 0.9% FLUSH: 3.0000 mL | INTRAVENOUS | Status: DC | PRN

## 2019-10-16 MED ORDER — HYDRALAZINE HCL 20 MG/ML IJ SOLN
INTRAMUSCULAR | Status: AC
  Filled 2019-10-16: qty 1

## 2019-10-16 MED ORDER — FAMOTIDINE IN NACL 20-0.9 MG/50ML-% IV SOLN
INTRAVENOUS | Status: AC
  Filled 2019-10-16: qty 50

## 2019-10-16 MED ORDER — LEVETIRACETAM 500 MG PO TABS
500.0000 mg | ORAL_TABLET | Freq: Two times a day (BID) | ORAL | Status: DC
  Administered 2019-10-16 – 2019-10-17 (×2): 500 mg via ORAL
  Filled 2019-10-16 (×2): qty 1

## 2019-10-16 MED ORDER — CARVEDILOL 25 MG PO TABS
25.0000 mg | ORAL_TABLET | Freq: Two times a day (BID) | ORAL | Status: DC
  Administered 2019-10-17: 25 mg via ORAL
  Filled 2019-10-16: qty 1

## 2019-10-16 MED ORDER — ASPIRIN 81 MG PO CHEW: 324.0000 mg | CHEWABLE_TABLET | ORAL | Status: AC

## 2019-10-16 MED ORDER — INSULIN ASPART 100 UNIT/ML ~~LOC~~ SOLN: 0.0000 [IU] | Freq: Every day | SUBCUTANEOUS | Status: DC

## 2019-10-16 MED ORDER — FENTANYL CITRATE (PF) 100 MCG/2ML IJ SOLN
INTRAMUSCULAR | Status: AC
  Filled 2019-10-16: qty 2

## 2019-10-16 MED ORDER — HEPARIN (PORCINE) 25000 UT/250ML-% IV SOLN
900.0000 [IU]/h | INTRAVENOUS | Status: DC
  Filled 2019-10-16: qty 250

## 2019-10-16 MED ORDER — VERAPAMIL HCL 2.5 MG/ML IV SOLN
INTRAVENOUS | Status: AC
  Filled 2019-10-16: qty 2

## 2019-10-16 MED ORDER — CLOPIDOGREL BISULFATE 75 MG PO TABS
75.0000 mg | ORAL_TABLET | Freq: Every day | ORAL | Status: DC
  Administered 2019-10-17: 75 mg via ORAL
  Filled 2019-10-16: qty 1

## 2019-10-16 MED ORDER — SODIUM CHLORIDE 0.9 % IV SOLN: 250.0000 mL | INTRAVENOUS | Status: DC | PRN

## 2019-10-16 MED ORDER — SODIUM CHLORIDE 0.9 % IV SOLN
INTRAVENOUS | Status: AC | PRN
  Administered 2019-10-16: 80 mL/h via INTRAVENOUS

## 2019-10-16 MED ORDER — LIDOCAINE HCL (PF) 1 % IJ SOLN
INTRAMUSCULAR | Status: DC | PRN
  Administered 2019-10-16: 2 mL via SUBCUTANEOUS
  Administered 2019-10-16: 15 mL via SUBCUTANEOUS

## 2019-10-16 MED ORDER — HEPARIN SODIUM (PORCINE) 5000 UNIT/ML IJ SOLN
5000.0000 [IU] | Freq: Three times a day (TID) | INTRAMUSCULAR | Status: DC
  Administered 2019-10-17: 5000 [IU] via SUBCUTANEOUS
  Filled 2019-10-16: qty 1

## 2019-10-16 MED ORDER — FAMOTIDINE IN NACL 20-0.9 MG/50ML-% IV SOLN: 20.0000 mg | Freq: Once | INTRAVENOUS | Status: DC

## 2019-10-16 MED ORDER — VERAPAMIL HCL 2.5 MG/ML IV SOLN
INTRAVENOUS | Status: DC | PRN
  Administered 2019-10-16: 10 mL via INTRA_ARTERIAL

## 2019-10-16 MED ORDER — CLOPIDOGREL BISULFATE 300 MG PO TABS
ORAL_TABLET | ORAL | Status: AC
  Filled 2019-10-16: qty 2

## 2019-10-16 MED ORDER — ACETAMINOPHEN 325 MG PO TABS: 650.0000 mg | ORAL_TABLET | ORAL | Status: DC | PRN

## 2019-10-16 MED ORDER — HEPARIN (PORCINE) IN NACL 1000-0.9 UT/500ML-% IV SOLN
INTRAVENOUS | Status: AC
  Filled 2019-10-16: qty 1000

## 2019-10-16 MED ORDER — NITROGLYCERIN IN D5W 200-5 MCG/ML-% IV SOLN
INTRAVENOUS | Status: AC | PRN
  Administered 2019-10-16: 10 ug/min via INTRAVENOUS

## 2019-10-16 MED ORDER — HEPARIN (PORCINE) IN NACL 1000-0.9 UT/500ML-% IV SOLN
INTRAVENOUS | Status: DC | PRN
  Administered 2019-10-16 (×2): 500 mL

## 2019-10-16 MED ORDER — HYDRALAZINE HCL 20 MG/ML IJ SOLN
INTRAMUSCULAR | Status: DC | PRN
  Administered 2019-10-16: 20 mg via INTRAVENOUS

## 2019-10-16 MED ORDER — HEPARIN SODIUM (PORCINE) 1000 UNIT/ML IJ SOLN
INTRAMUSCULAR | Status: AC
  Filled 2019-10-16: qty 1

## 2019-10-16 MED ORDER — FENTANYL CITRATE (PF) 100 MCG/2ML IJ SOLN
INTRAMUSCULAR | Status: DC | PRN
  Administered 2019-10-16: 50 ug via INTRAVENOUS
  Administered 2019-10-16: 25 ug via INTRAVENOUS

## 2019-10-16 MED ORDER — SODIUM CHLORIDE 0.9% FLUSH
3.0000 mL | Freq: Two times a day (BID) | INTRAVENOUS | Status: DC
  Administered 2019-10-16 – 2019-10-17 (×2): 3 mL via INTRAVENOUS

## 2019-10-16 MED ORDER — FAMOTIDINE IN NACL 20-0.9 MG/50ML-% IV SOLN
INTRAVENOUS | Status: AC | PRN
  Administered 2019-10-16: 20 mg via INTRAVENOUS

## 2019-10-16 MED ORDER — VITAMIN D 25 MCG (1000 UNIT) PO TABS
1000.0000 [IU] | ORAL_TABLET | Freq: Every day | ORAL | Status: DC
  Administered 2019-10-17: 1000 [IU] via ORAL
  Filled 2019-10-16: qty 1

## 2019-10-16 MED ORDER — ACETAMINOPHEN 325 MG PO TABS
650.0000 mg | ORAL_TABLET | ORAL | Status: DC | PRN
  Administered 2019-10-16: 650 mg via ORAL

## 2019-10-16 SURGICAL SUPPLY — 25 items
BALLN SAPPHIRE 2.5X15 (BALLOONS) ×2
BALLN SAPPHIRE ~~LOC~~ 3.75X12 (BALLOONS) ×1 IMPLANT
BALLOON SAPPHIRE 2.5X15 (BALLOONS) IMPLANT
CATH INFINITI 5FR ANG PIGTAIL (CATHETERS) ×1 IMPLANT
CATH INFINITI 5FR JL4 (CATHETERS) ×1 IMPLANT
CATH INFINITI JR4 5F (CATHETERS) ×1 IMPLANT
CATH LAUNCHER 6FR JL4 (CATHETERS) ×1 IMPLANT
CATH LAUNCHER 6FR JR4 (CATHETERS) ×2 IMPLANT
CATH OPTITORQUE TIG 4.0 5F (CATHETERS) ×1 IMPLANT
DEVICE RAD COMP TR BAND LRG (VASCULAR PRODUCTS) ×1 IMPLANT
GLIDESHEATH SLEND SS 6F .021 (SHEATH) ×1 IMPLANT
GUIDEWIRE INQWIRE 1.5J.035X260 (WIRE) IMPLANT
INQWIRE 1.5J .035X260CM (WIRE) ×2
KIT ENCORE 26 ADVANTAGE (KITS) ×1 IMPLANT
KIT HEART LEFT (KITS) ×2 IMPLANT
PACK CARDIAC CATHETERIZATION (CUSTOM PROCEDURE TRAY) ×2 IMPLANT
SHEATH PINNACLE 5F 10CM (SHEATH) ×1 IMPLANT
SHEATH PINNACLE 6F 10CM (SHEATH) ×1 IMPLANT
SHEATH PROBE COVER 6X72 (BAG) ×1 IMPLANT
STENT RESOLUTE ONYX 3.5X34 (Permanent Stent) ×1 IMPLANT
TRANSDUCER W/STOPCOCK (MISCELLANEOUS) ×2 IMPLANT
TUBING CIL FLEX 10 FLL-RA (TUBING) ×2 IMPLANT
WIRE ASAHI PROWATER 180CM (WIRE) ×1 IMPLANT
WIRE EMERALD 3MM-J .035X150CM (WIRE) ×1 IMPLANT
WIRE HI TORQ BMW 190CM (WIRE) ×1 IMPLANT

## 2019-10-16 NOTE — Progress Notes (Signed)
The patient understands that risks included but are not limited to stroke (1 in 1000), death (1 in 12), kidney failure [usually temporary] (1 in 500), bleeding (1 in 200), allergic reaction [possibly serious] (1 in 200).   I talked with pt and son and daughter listened in on phone.  Gave all options, treat medically which may be difficult with her many allergies.  But if she did not want cath we would do this.  Cath would let us know her disease and may be able to place stent to open vessel.  I answered all questions.  She is anxious but agreeable.  She needs premedication with steroids, and pepcid since she has allergy to iodine.  RN is aware.

## 2019-10-16 NOTE — ED Notes (Signed)
Attempted to get blood x2. Unsuccessful. Pt stated, "im a hard stick." tech notified nurse.

## 2019-10-16 NOTE — Progress Notes (Signed)
ANTICOAGULATION CONSULT NOTE - Initial Consult  Pharmacy Consult for heparin Indication: chest pain/ACS  Allergies  Allergen Reactions  . Ativan [Lorazepam]     Very confused  . Tramadol Hcl Anxiety and Rash    Headache  . Amlodipine Besylate Other (See Comments)     dizzy  . Atenolol Other (See Comments)    Fatigue  . Benazepril Cough  . Benicar [Olmesartan Medoxomil] Other (See Comments)    HEADACHE  . Cozaar     nausea  . Hydralazine     Hair loss  . Hydrochlorothiazide W-Triamterene Other (See Comments)     dizzy  . Hydrocodone Other (See Comments)    HEADACHE  . Hydroxyzine Pamoate Other (See Comments)    Per MAR  . Iodine Other (See Comments)    Per MAR  . Lisinopril Other (See Comments) and Cough    Tired & fatigue  . Peach Flavor Itching  . Penicillins Itching    Has patient had a PCN reaction causing immediate rash, facial/tongue/throat swelling, SOB or lightheadedness with hypotension: No Has patient had a PCN reaction causing severe rash involving mucus membranes or skin necrosis: No Has patient had a PCN reaction that required hospitalization No Has patient had a PCN reaction occurring within the last 10 years: Yes If all of the above answers are "NO", then may proceed with Cephalosporin use.  tolerates cephalosporins OK   . Pravastatin Other (See Comments)    Myalgias-muscle pain  . Prednisolone Nausea Only and Other (See Comments)    Upset stomach  . Strawberry Flavor Itching  . Tramadol Hcl Other (See Comments)    headache  . Benadryl [Diphenhydramine] Itching and Palpitations    Patient Measurements: Height: 5\' 4"  (162.6 cm) Weight: 174 lb (78.9 kg) IBW/kg (Calculated) : 54.7 Heparin Dosing Weight: 71.5kg  Vital Signs: Temp: 98.1 F (36.7 C) (12/04 0936) Temp Source: Oral (12/04 0936) BP: 187/76 (12/04 1415) Pulse Rate: 59 (12/04 1415)  Labs: Recent Labs    10/16/19 1056  HGB 10.3*  HCT 33.0*  PLT 162  CREATININE 1.14*   TROPONINIHS 1,048*    Estimated Creatinine Clearance: 37.3 mL/min (A) (by C-G formula based on SCr of 1.14 mg/dL (H)).   Medical History: Past Medical History:  Diagnosis Date  . Anemia    iron deficiency  . Aneurysm, thoracic aortic (HCC)   . Anxiety   . B12 deficiency   . CAD (coronary artery disease)    s/p stenting of LAD 1999- cath 5-08 EF normal LAD 30-40% restenosis. D1 50% D2 80% LCX & RCA minimal plaque  . Chronic back pain   . Constipation   . Depression   . Diabetes mellitus   . DVT (deep venous thrombosis) (HCC)   . GERD (gastroesophageal reflux disease)   . Gout   . HTN (hypertension)   . Hyperlipemia   . Obesity   . Osteoporosis   . Pancreatitis   . Polyarthritis    DJD/ possible PMR  . Renal insufficiency    Cr 1.2-1.3  . Seizures (HCC)   . Tinnitus   . Urinary frequency   . Vertigo   . Vitamin D deficiency     Medications:  Infusions:  . heparin    . nitroGLYCERIN      Assessment: 18 yof presented to the ED with CP. Troponin elevated and now starting IV heparin. Baseline Hgb is slightly low at 10.3 and platelets are WNL. She is not on anticoagulation PTA.   Goal  of Therapy:  Heparin level 0.3-0.7 units/ml Monitor platelets by anticoagulation protocol: Yes   Plan:  Heparin bolus 4000 units IV x 1 Heparin gtt 900 units/hr Check an 8 hr heparin level Daily heparin level and CBC  Shardea Cwynar, Rande Lawman 10/16/2019,2:51 PM

## 2019-10-16 NOTE — ED Provider Notes (Signed)
[facility] EMERGENCY DEPARTMENT Provider Note   CSN: [id number] Arrival date & time: 10/16/19  1287     History   Chief Complaint Chief Complaint  Patient presents with  . Chest Pain    HPI [name] is a 85 y.o. female past medical history significant for hypertension, iron deficiency anemia, CAD, type 2 diabetes, CHF, seizures, TIA, hyperlipidemia presents to emergency department today with chief complaint of chest pain.  Patient states she has had intermittent chest pain x1 week however pain worsened and became constat at 230 this morning, approximately 7 hours prior to arrival. Pain did not wake her from sleep.  She describes the pain is located in the bottom of her throat and radiates to her left chest and left arm.  She rates the pain 10 of 10 in severity.  She is unable to further describe the pain, she says it feels like a bloating sensation.  She is reporting associated nausea without emesis and shortness of breath.  Denies worsening pain with exertion. EMS gave nitro and aspirin.  Pain only improved to 8 out of 10 in severity. Denies fever, chills, congestion, diaphoresis, abdominal pain, back pain. Patient lives at home with her family.  Denies any sick contacts. Chart review shows she had an echo on 07/18/2016 that shows LVEF of 50%.   Past Medical History:  Diagnosis Date  . Anemia    iron deficiency  . Aneurysm, thoracic aortic (HCC)   . Anxiety   . B12 deficiency   . CAD (coronary artery disease)    s/p stenting of LAD 1999- cath 5-08 EF normal LAD 30-40% restenosis. D1 50% D2 80% LCX & RCA minimal plaque  . Chronic back pain   . Constipation   . Depression   . Diabetes mellitus   . DVT (deep venous thrombosis) (HCC)   . GERD (gastroesophageal reflux disease)   . Gout   . HTN (hypertension)   . Hyperlipemia   . Obesity   . Osteoporosis   . Pancreatitis   . Polyarthritis    DJD/ possible PMR  . Renal insufficiency    Cr 1.2-1.3  .  Seizures (HCC)   . Tinnitus   . Urinary frequency   . Vertigo   . Vitamin D deficiency     Patient Active Problem List   Diagnosis Date Noted  . Unstable angina (HCC) 10/16/2019  . Nausea 06/23/2018  . CHF (congestive heart failure) (HCC) 04/03/2018  . AKI (acute kidney injury) (HCC) 11/20/2017  . Dehydration   . Generalized weakness 11/18/2017  . Second degree burn of foot 11/18/2017  . Seizures (HCC) 07/17/2016  . Hypertensive urgency 07/17/2016  . Acute diastolic CHF (congestive heart failure) (HCC) 07/17/2016  . UTI (urinary tract infection) 02/28/2016  . Urinary incontinence 10/28/2015  . Alopecia 08/12/2015  . TIA (transient ischemic attack) 07/01/2015  . Acute encephalopathy 06/24/2015  . Protein-calorie malnutrition, severe (HCC) 04/19/2015  . Fever   . Abdominal abscess   . Pressure ulcer 04/07/2015  . Acute kidney injury (HCC)   . Sepsis (HCC)   . Stroke (HCC)   . Facial droop   . Confusion 03/26/2015  . Malnutrition of moderate degree (HCC) 03/20/2015  . Small bowel obstruction s/p exlap/LOA/decompression 03/25/2015 03/18/2015  . Essential hypertension 03/18/2015  . Noncompliance with medication regimen 12/07/2014  . Fall against object 06/24/2014  . Contusion of left hand 06/24/2014  . Contusion of left hip 06/24/2014  . Loss of weight 04/12/2014  .  Malignant hypertension with renal failure and congestive heart failure (HCC) 09/23/2013  . Knee pain, bilateral 07/27/2013  . Chronic fatigue disorder 01/19/2013  . Vaginitis due to Candida 09/02/2012  . Sinusitis 09/02/2012  . Anemia in other chronic diseases classified elsewhere 09/02/2012  . Polymyalgia rheumatica (HCC) 09/10/2011  . Vertigo 08/21/2011  . Fatigue 05/11/2011  . ANEMIA OF CHRONIC DISEASE 09/06/2010  . KNEE PAIN, CHRONIC 05/26/2010  . DIZZINESS 05/11/2010  . SHOULDER PAIN 04/25/2010  . FREQUENCY, URINARY 03/16/2010  . CONSTIPATION, CHRONIC 01/17/2010  . FOOT PAIN 01/17/2009  . RASH AND  OTHER NONSPECIFIC SKIN ERUPTION 01/17/2009  . TINNITUS NOS 10/18/2008  . B12 deficiency 08/10/2008  . LOW BACK PAIN 06/23/2008  . CHEST WALL PAIN 06/23/2008  . DYSURIA 03/18/2008  . Abdominal pain 03/18/2008  . HYPERLIPIDEMIA 12/24/2007  . DYSPNEA 12/24/2007  . Anxiety state 12/07/2007  . GERD 12/07/2007  . CHEST PAIN 12/07/2007  . OTHER SPEC FORMS CHRONIC ISCHEMIC HEART DISEASE 12/01/2007  . Type 2 diabetes mellitus with diabetic nephropathy, without long-term current use of insulin (HCC) 11/28/2007  . Pain in joint 11/28/2007  . Gout 09/17/2007  . Adjustment disorder with mixed anxiety and depressed mood 09/17/2007  . Coronary atherosclerosis 09/17/2007  . Disorder resulting from impaired renal function 09/17/2007  . OSTEOPOROSIS 09/17/2007  . DVT, HX OF 09/17/2007  . PANCREATITIS, HX OF 09/17/2007    Past Surgical History:  Procedure Laterality Date  . ABDOMINAL HYSTERECTOMY    . CARDIAC CATHETERIZATION  2008   L main 20%, LAD stent patent, D1 50%, D2 80% (small), RCA 20%, EF 55-60%  . CHOLECYSTECTOMY    . CORONARY ANGIOPLASTY WITH STENT PLACEMENT  1999   LAD stent  . HEMORRHOID SURGERY    . LAPAROSCOPIC LYSIS OF ADHESIONS N/A 03/24/2015   Procedure: LAPAROSCOPIC LYSIS OF ADHESIONS;  Surgeon: Luretha Murphy, MD;  Location: [facility];  Service: General;  Laterality: N/A;  . LAPAROSCOPY N/A 03/24/2015   Procedure: LAPAROSCOPY DIAGNOSTIC;  Surgeon: Luretha Murphy, MD;  Location: [facility];  Service: General;  Laterality: N/A;  . LAPAROTOMY N/A 03/24/2015   Procedure: LAPAROTOMY with decompression of bowel;  Surgeon: Luretha Murphy, MD;  Location: [facility];  Service: General;  Laterality: N/A;  . TUBAL LIGATION       OB History   No obstetric history on file.      Home Medications    Prior to Admission medications   Medication Sig Start Date End Date Taking? Authorizing Provider  acetaminophen (TYLENOL) 500 MG tablet Take 500 mg by mouth every 6 (six) hours as needed.    Yes  [provider]  Alcohol Swabs (B-D SINGLE USE SWABS REGULAR) PADS Use to clean area to check blood sugars twice a day 08/27/17  Yes Plotnikov, [name], MD  Blood Glucose Calibration (ACCU-CHEK AVIVA) SOLN Use as directed 02/20/17  Yes Plotnikov, [name], MD  carvedilol (COREG) 25 MG tablet TAKE 1 TABLET BY MOUTH TWICE DAILY WITH MEALS 08/27/19  Yes Plotnikov, [name], MD  cholecalciferol (VITAMIN D) 1000 units tablet Take 1,000 Units by mouth daily.   Yes [provider]  cyclobenzaprine (FLEXERIL) 5 MG tablet Take 1 tablet (5 mg total) by mouth at bedtime as needed for muscle spasms. 08/25/19  Yes Plotnikov, [name], MD  DULoxetine (CYMBALTA) 20 MG capsule Take 1 capsule (20 mg total) by mouth daily. 06/23/19  Yes Myrlene Broker, MD  furosemide (LASIX) 20 MG tablet Take 1 tablet (20 mg total) by mouth  daily as needed. Leg swelling 08/18/19  Yes Plotnikov, Georgina QuintAleksei V, MD  glucose blood (ONETOUCH VERIO) test strip Use as instructed 08/27/17  Yes Plotnikov, Georgina QuintAleksei V, MD  levETIRAcetam (KEPPRA) 500 MG tablet TAKE 1 TABLET TWICE DAILY 09/04/19  Yes Plotnikov, Georgina QuintAleksei V, MD  ondansetron (ZOFRAN) 4 MG tablet TAKE 1 TABLET BY MOUTH TWICE DAILY AS NEEDED FOR NAUSEA FOR VOMITING Patient taking differently: Take 4 mg by mouth every 8 (eight) hours as needed for nausea or vomiting.  09/20/19  Yes Plotnikov, Georgina QuintAleksei V, MD  [facility] IncNETOUCH DELICA LANCETS FINE MISC 1 Device by Does not apply route daily as needed. 09/21/16  Yes Plotnikov, Georgina QuintAleksei V, MD  predniSONE (DELTASONE) 5 MG tablet Take 2 tablets (10 mg total) by mouth daily with breakfast. 08/18/19  Yes Plotnikov, Georgina QuintAleksei V, MD  vitamin B-12 (CYANOCOBALAMIN) 1000 MCG tablet Take 1,000 mcg by mouth daily.   Yes [provider]  olmesartan (BENICAR) 20 MG tablet Take 20 mg by mouth daily.  01/22/12  [provider]    Family History Family History  Adopted: Yes  Problem Relation Age of Onset  . Diabetes Mother   .  Hypertension Father     Social History Social History   Tobacco Use  . Smoking status: Never Smoker  . Smokeless tobacco: Never Used  Substance Use Topics  . Alcohol use: No  . Drug use: No     Allergies   Ativan [lorazepam], Tramadol hcl, Amlodipine besylate, Atenolol, Benazepril, Benicar [olmesartan medoxomil], Cozaar, Hydralazine, Hydrochlorothiazide w-triamterene, Hydrocodone, Hydroxyzine pamoate, Iodine, Lisinopril, Peach flavor, Penicillins, Pravastatin, Prednisolone, Strawberry flavor, Tramadol hcl, and Benadryl [diphenhydramine]   Review of Systems Review of Systems All other systems are reviewed and are negative for acute change except as noted in the HPI.   Physical Exam Updated Vital Signs BP (!) 174/150 (BP Location: Right Arm)   Pulse 61   Temp 98.1 F (36.7 C) (Oral)   Resp 16   Ht 5\' 4"  (1.626 m)   Wt 78.9 kg   SpO2 100%   BMI 29.87 kg/m   Physical Exam Vitals signs and nursing note reviewed.  Constitutional:      General: She is not in acute distress.    Appearance: She is not ill-appearing.  HENT:     Head: Normocephalic and atraumatic.     Right Ear: Tympanic membrane and external ear normal.     Left Ear: Tympanic membrane and external ear normal.     Nose: Nose normal.     Mouth/Throat:     Mouth: Mucous membranes are dry.     Pharynx: Oropharynx is clear.  Eyes:     General: No scleral icterus.       Right eye: No discharge.        Left eye: No discharge.     Extraocular Movements: Extraocular movements intact.     Conjunctiva/sclera: Conjunctivae normal.     Pupils: Pupils are equal, round, and reactive to light.  Neck:     Musculoskeletal: Normal range of motion.     Vascular: No JVD.  Cardiovascular:     Rate and Rhythm: Normal rate and regular rhythm.     Pulses: Normal pulses.          Radial pulses are 2+ on the right side and 2+ on the left side.     Heart sounds: Normal heart sounds.  Pulmonary:     Comments: Tachypneic,  she is speaking in full sentences. Lungs clear to auscultation  in all fields. Symmetric chest rise. No wheezing, rales, or rhonchi. SpO2 is 96% on room air. Chest:     Chest wall: No tenderness.  Abdominal:     Comments: Abdomen is soft, non-distended, and non-tender in all quadrants. No rigidity, no guarding. No peritoneal signs.  Musculoskeletal: Normal range of motion.     Right lower leg: No edema.     Left lower leg: No edema.  Skin:    General: Skin is warm and dry.     Capillary Refill: Capillary refill takes less than 2 seconds.  Neurological:     Mental Status: She is oriented to person, place, and time.     GCS: GCS eye subscore is 4. GCS verbal subscore is 5. GCS motor subscore is 6.     Comments: Fluent speech, no facial droop.  Psychiatric:        Behavior: Behavior normal.      ED Treatments / Results  Labs (all labs ordered are listed, but only abnormal results are displayed) Labs Reviewed  CBC WITH DIFFERENTIAL/PLATELET - Abnormal; Notable for the following components:      Result Value   RBC 3.48 (*)    Hemoglobin 10.3 (*)    HCT 33.0 (*)    All other components within normal limits  BASIC METABOLIC PANEL - Abnormal; Notable for the following components:   CO2 21 (*)    Glucose, Bld 116 (*)    Creatinine, Ser 1.14 (*)    GFR calc non Af Amer 44 (*)    GFR calc Af Amer 51 (*)    All other components within normal limits  URINALYSIS, ROUTINE W REFLEX MICROSCOPIC - Abnormal; Notable for the following components:   Color, Urine STRAW (*)    Protein, ur 30 (*)    Bacteria, UA RARE (*)    All other components within normal limits  CBG MONITORING, ED - Abnormal; Notable for the following components:   Glucose-Capillary 147 (*)    All other components within normal limits  TROPONIN I (HIGH SENSITIVITY) - Abnormal; Notable for the following components:   Troponin I (High Sensitivity) 1,048 (*)    All other components within normal limits  TROPONIN I (HIGH  SENSITIVITY) - Abnormal; Notable for the following components:   Troponin I (High Sensitivity) 3,696 (*)    All other components within normal limits  SARS CORONAVIRUS 2 (TAT 6-24 HRS)  HEPARIN LEVEL (UNFRACTIONATED)  POC SARS CORONAVIRUS 2 AG -  ED    EKG EKG Interpretation  Date/Time:  Friday October 16 2019 09:27:15 EST Ventricular Rate:  80 PR Interval:    QRS Duration: 130 QT Interval:  427 QTC Calculation: 493 R Axis:   95 Text Interpretation: Sinus rhythm RBBB and LPFB Minimal ST elevation, inferior leads Confirmed by Donnetta Hutchingook, Brian ([id number]) on 10/16/2019 9:36:33 AM Also confirmed by Donnetta Hutchingook, Brian ([id number])  on 10/16/2019 9:37:38 AM   Radiology Dg Chest Port 1 View  Result Date: 10/16/2019 CLINICAL DATA:  Chest pain since yesterday. EXAM: PORTABLE CHEST 1 VIEW COMPARISON:  07/02/2019 FINDINGS: The cardiac silhouette, mediastinal and hilar contours are within normal limits and stable stable tortuosity and calcification of the thoracic aorta. Basilar scarring changes but no definite infiltrates or effusions. No worrisome pulmonary lesions. The bony thorax is intact. IMPRESSION: No acute cardiopulmonary findings. Electronically Signed   By: Rudie MeyerP.  Gallerani M.D.   On: 10/16/2019 10:00    Procedures .Critical Care Performed by: Sherene SiresAlbrizze, Kaitlyn E, PA-C Authorized by: Doug SouAlbrizze, Kaitlyn  E, PA-C   Critical care provider statement:    Critical care time (minutes):  41   Critical care time was exclusive of:  Separately billable procedures and treating other patients and teaching time   Critical care was time spent personally by me on the following activities:  Blood draw for specimens, development of treatment plan with patient or surrogate, discussions with consultants, evaluation of patient's response to treatment, obtaining history from patient or surrogate, ordering and performing treatments and interventions, ordering and review of laboratory studies, ordering and review of radiographic  studies, review of old charts, re-evaluation of patient's condition, pulse oximetry and examination of patient   I assumed direction of critical care for this patient from another provider in my specialty: no     (including critical care time)  Medications Ordered in ED Medications  nitroGLYCERIN (NITROSTAT) SL tablet 0.4 mg (0.4 mg Sublingual Given 10/16/19 1523)  nitroGLYCERIN 50 mg in dextrose 5 % 250 mL (0.2 mg/mL) infusion (has no administration in time range)  heparin bolus via infusion 4,000 Units (has no administration in time range)  heparin ADULT infusion 100 units/mL (25000 units/272mL sodium chloride 0.45%) (has no administration in time range)  sodium chloride flush (NS) 0.9 % injection 3 mL (has no administration in time range)  famotidine (PEPCID) IVPB 20 mg premix (has no administration in time range)  acetaminophen (TYLENOL) tablet 650 mg (650 mg Oral Given 10/16/19 1240)     Initial Impression / Assessment and Plan / ED Course  I have reviewed the triage vital signs and the nursing notes.  Pertinent labs & imaging results that were available during my care of the patient were reviewed by me and considered in my medical decision making (see chart for details).  Patient seen and examined. Afebrile, BP 174/94, no tachycardia, no hypoxia.   On arrival she is nontoxic-appearing. She is tachypneic but speaking in full sentences without signs of respiratory distress. Lungs are clear to auscultation in all fields. No abdominal tenderness, no peritoneal signs.  No signs of volume overload. EKG today shows right bundle branch block, minimal ST elevation in inferior leads.  CBG on arrival is 147. Heart score of 6. BMP with creatinine of 1.14, seems consistent with baseline.  No severe electrolyte derangements.  UA without signs of infection.CBC without leukocytosis, hemoglobin 10.3, chart review shows baseline one year ago ranging from 9-11. I viewed pt's chest xray and it does not  suggest acute infectious processes Troponin is elevated at 1048. Cardiology consulted and pt evaluated by cardiology attending Dr. Martinique who agrees to admit patient to the hospital and will follow second troponin of 3696. Heparin ordered per cardiology. Findings and plan of care discussed with supervising physician Dr. Lacinda Axon.  Portions of this note were generated with [employer]. Dictation errors may occur despite best attempts at proofreading.  Final Clinical Impressions(s) / ED Diagnoses   Final diagnoses:  Unstable angina [facility])    ED Discharge Orders    None       Flint Melter 10/16/19 1605    Nat Christen, MD 10/19/19 [phone]

## 2019-10-16 NOTE — ED Notes (Signed)
Notified PA, Albrizze of patients critical troponin level being 1,048.

## 2019-10-16 NOTE — H&P (Signed)
Cardiology Admission History and Physical:   Patient ID: [name] MRN: [id number]; DOB: 09/14/1933   Admission date: 10/16/2019  Primary Care Provider: Tresa GarterPlotnikov, Aleksei V, MD Primary Cardiologist: No primary care provider on file. remote Hochrein Primary Electrophysiologist:  None   Chief Complaint:  Chest pain  Patient Profile:   [name] is a [age] y.o. female with CAD, multiple allergies, DM-2, HTN, hx DVT, seizures, Polymyalgia rheumatica, B12 def,  GERD, pancreatitis, thoracic aortic aneurysm now seen for chest pain.   History of Present Illness:   [name] has not been seen by cardiology since 2015. Pt with above hx and she did have hx of cardiac cath 1999 with stent to LAD, cath 2008 with nomal LAD and 30-40% restenosis D1 50% stenosis, D2 80% stenosis, LCX and RCA minimal plaque.  With the 2008 cath her CK was elevated but cath stable. Echo 2017 with EF 50% moderate concentric hypertrophy.  G1DD   Seen in Oct by PCP with UTI.   Now admitted after presenting to ER by EMS.  She had left sided chest pain that began yesterday.  Never had this type of pain before.   She notes pain began 3 weeks ago though poor historian.  It has been present most of time.  Could not do regular activities because of pain.   Last night bad 10/10 and called EMS today.  Currently pain is 5/10. Some nausea but she has had nausea for some time.  Some SOB.     EKG:  The ECG that was done 10/16/19  was personally reviewed and demonstrates SR at 80 and RBBB and LPFB minimal sT elevation inf leads.  Troponin 1048 Na 137, K+ 4.5, BUN 20 Cr 1.14  Hgb 10.3 WBC 7.1 plts 162  PCXR  No acute cardiopulmonary findings.  Currently has had tylenol.  Will give NTG. This was given without improvement in symptoms.   Covid test done.    Heart Pathway Score:     Past Medical History:  Diagnosis Date  . Anemia    iron deficiency  . Aneurysm, thoracic aortic (HCC)   . Anxiety   . B12 deficiency    . CAD (coronary artery disease)    s/p stenting of LAD 1999- cath 5-08 EF normal LAD 30-40% restenosis. D1 50% D2 80% LCX & RCA minimal plaque  . Chronic back pain   . Constipation   . Depression   . Diabetes mellitus   . DVT (deep venous thrombosis) (HCC)   . GERD (gastroesophageal reflux disease)   . Gout   . HTN (hypertension)   . Hyperlipemia   . Obesity   . Osteoporosis   . Pancreatitis   . Polyarthritis    DJD/ possible PMR  . Renal insufficiency    Cr 1.2-1.3  . Seizures (HCC)   . Tinnitus   . Urinary frequency   . Vertigo   . Vitamin D deficiency     Past Surgical History:  Procedure Laterality Date  . ABDOMINAL HYSTERECTOMY    . CARDIAC CATHETERIZATION  2008   L main 20%, LAD stent patent, D1 50%, D2 80% (small), RCA 20%, EF 55-60%  . CHOLECYSTECTOMY    . CORONARY ANGIOPLASTY WITH STENT PLACEMENT  1999   LAD stent  . HEMORRHOID SURGERY    . LAPAROSCOPIC LYSIS OF ADHESIONS N/A 03/24/2015   Procedure: LAPAROSCOPIC LYSIS OF ADHESIONS;  Surgeon: Luretha MurphyMatthew Martin, MD;  Location: [facility];  Service: General;  Laterality: N/A;  .  LAPAROSCOPY N/A 03/24/2015   Procedure: LAPAROSCOPY DIAGNOSTIC;  Surgeon: Luretha MurphyMatthew Martin, MD;  Location: [facility];  Service: General;  Laterality: N/A;  . LAPAROTOMY N/A 03/24/2015   Procedure: LAPAROTOMY with decompression of bowel;  Surgeon: Luretha MurphyMatthew Martin, MD;  Location: [facility];  Service: General;  Laterality: N/A;  . TUBAL LIGATION       Medications Prior to Admission: Prior to Admission medications   Medication Sig Start Date End Date Taking? Authorizing Provider  acetaminophen (TYLENOL) 500 MG tablet Take 500 mg by mouth every 6 (six) hours as needed.     [provider]  Alcohol Swabs (B-D SINGLE USE SWABS REGULAR) PADS Use to clean area to check blood sugars twice a day 08/27/17   Plotnikov, Georgina QuintAleksei V, MD  aspirin (BAYER ASPIRIN) 325 MG tablet Take 1 tablet (325 mg total) by mouth daily. 04/03/18   Plotnikov, Georgina QuintAleksei V, MD  Blood  Glucose Calibration (ACCU-CHEK AVIVA) SOLN Use as directed 02/20/17   Plotnikov, Georgina QuintAleksei V, MD  carvedilol (COREG) 25 MG tablet TAKE 1 TABLET BY MOUTH TWICE DAILY WITH MEALS 08/27/19   Plotnikov, Georgina QuintAleksei V, MD  cholecalciferol (VITAMIN D) 1000 units tablet Take 1,000 Units by mouth daily.    [provider]  ciprofloxacin (CIPRO) 250 MG tablet Take 1 tablet (250 mg total) by mouth 2 (two) times daily. 08/22/19   Plotnikov, Georgina QuintAleksei V, MD  cyclobenzaprine (FLEXERIL) 5 MG tablet Take 1 tablet (5 mg total) by mouth at bedtime as needed for muscle spasms. 08/25/19   Plotnikov, Georgina QuintAleksei V, MD  DULoxetine (CYMBALTA) 20 MG capsule Take 1 capsule (20 mg total) by mouth daily. 06/23/19   Myrlene Brokerrawford, Elizabeth A, MD  furosemide (LASIX) 20 MG tablet Take 1 tablet (20 mg total) by mouth daily as needed. Leg swelling 08/18/19   Plotnikov, Georgina QuintAleksei V, MD  glucose blood (ONETOUCH VERIO) test strip Use as instructed 08/27/17   Plotnikov, Georgina QuintAleksei V, MD  levETIRAcetam (KEPPRA) 500 MG tablet TAKE 1 TABLET TWICE DAILY 09/04/19   Plotnikov, Georgina QuintAleksei V, MD  ondansetron (ZOFRAN) 4 MG tablet TAKE 1 TABLET BY MOUTH TWICE DAILY AS NEEDED FOR NAUSEA FOR VOMITING 09/20/19   Plotnikov, Georgina QuintAleksei V, MD  ONETOUCH DELICA LANCETS FINE MISC 1 Device by Does not apply route daily as needed. 09/21/16   Plotnikov, Georgina QuintAleksei V, MD  predniSONE (DELTASONE) 5 MG tablet Take 2 tablets (10 mg total) by mouth daily with breakfast. 08/18/19   Plotnikov, Georgina QuintAleksei V, MD  vitamin B-12 (CYANOCOBALAMIN) 1000 MCG tablet Take 1,000 mcg by mouth daily.    [provider]  olmesartan (BENICAR) 20 MG tablet Take 20 mg by mouth daily.  01/22/12  [provider]     Allergies:    Allergies  Allergen Reactions  . Ativan [Lorazepam]     Very confused  . Tramadol Hcl Anxiety and Rash    Headache  . Amlodipine Besylate Other (See Comments)     dizzy  . Atenolol Other (See Comments)    Fatigue  . Benazepril Cough  . Benicar [Olmesartan  Medoxomil] Other (See Comments)    HEADACHE  . Cozaar     nausea  . Hydralazine     Hair loss  . Hydrochlorothiazide W-Triamterene Other (See Comments)     dizzy  . Hydrocodone Other (See Comments)    HEADACHE  . Hydroxyzine Pamoate Other (See Comments)    Per MAR  . Iodine Other (See Comments)    Per MAR  . Lisinopril Other (See Comments) and  Cough    Tired & fatigue  . Peach Flavor Itching  . Penicillins Itching    Has patient had a PCN reaction causing immediate rash, facial/tongue/throat swelling, SOB or lightheadedness with hypotension: No Has patient had a PCN reaction causing severe rash involving mucus membranes or skin necrosis: No Has patient had a PCN reaction that required hospitalization No Has patient had a PCN reaction occurring within the last 10 years: Yes If all of the above answers are "NO", then may proceed with Cephalosporin use.  tolerates cephalosporins OK   . Pravastatin Other (See Comments)    Myalgias-muscle pain  . Prednisolone Nausea Only and Other (See Comments)    Upset stomach  . Strawberry Flavor Itching  . Tramadol Hcl Other (See Comments)    headache  . Benadryl [Diphenhydramine] Itching and Palpitations    Social History:   Social History   Socioeconomic History  . Marital status: Married    Spouse name: Not on file  . Number of children: Not on file  . Years of education: Not on file  . Highest education level: Not on file  Occupational History  . Occupation: Retired  Engineer, production  . Financial resource strain: Not on file  . Food insecurity    Worry: Not on file    Inability: Not on file  . Transportation needs    Medical: Not on file    Non-medical: Not on file  Tobacco Use  . Smoking status: Never Smoker  . Smokeless tobacco: Never Used  Substance and Sexual Activity  . Alcohol use: No  . Drug use: No  . Sexual activity: Not Currently  Lifestyle  . Physical activity    Days per week: Not on file    Minutes per  session: Not on file  . Stress: Not on file  Relationships  . Social Musician on phone: Not on file    Gets together: Not on file    Attends religious service: Not on file    Active member of club or organization: Not on file    Attends meetings of clubs or organizations: Not on file    Relationship status: Not on file  . Intimate partner violence    Fear of current or ex partner: Not on file    Emotionally abused: Not on file    Physically abused: Not on file    Forced sexual activity: Not on file  Other Topics Concern  . Not on file  Social History Narrative   She lives in [place] with her husband   She is retired   No tobacco or alcohol use.    Family History:   The patient's family history includes Diabetes in her mother; Hypertension in her father. She was adopted.    ROS:  Please see the history of present illness.  General:no colds or fevers, no weight changes Skin:no rashes or ulcers HEENT:no blurred vision, no congestion CV:see HPI PUL:see HPI GI:no diarrhea constipation or melena, no indigestion GU:no hematuria, no dysuria MS:no joint pain, no claudication Neuro:no syncope, no lightheadedness, last seizure in the fall Endo:+ diabetes, no thyroid disease All other ROS reviewed and negative.     Physical Exam/Data:   Vitals:   10/16/19 1130 10/16/19 1145 10/16/19 1200 10/16/19 1215  BP: (!) 177/84 (!) 171/72 (!) 178/75 (!) 175/73  Pulse: (!) 55 (!) 58 69 60  Resp: 12 15 16 16   Temp:      TempSrc:  SpO2: 99% 98% 100% 98%   No intake or output data in the 24 hours ending 10/16/19 1306 Last 3 Weights 08/25/2019 06/23/2019 01/22/2019  Weight (lbs) 174 lb 184 lb 184 lb  Weight (kg) 78.926 kg 83.462 kg 83.462 kg     There is no height or weight on file to calculate BMI.  General:  Well nourished, well developed, in no acute distress, but having pain HEENT: normal Lymph: no adenopathy Neck: no JVD Endocrine:  No thryomegaly Vascular:  No carotid bruits; pedal pulses 1+ bilaterally Cardiac:  normal S1, S2; RRR; no murmur gallup rub or click Lungs:  clear to auscultation bilaterally, no wheezing, rhonchi few rales in bases  Abd: soft, nontender, no hepatomegaly  Ext: no edema Musculoskeletal:  No deformities, BUE and BLE strength normal and equal Skin: warm and dry  Neuro: alert and oriented X 3 MAE follows commands + facial symmetry, no focal abnormalities noted Psych:  Normal affect      Relevant CV Studies: Cardiac cath 2008  ASSESSMENT:  1. Single-vessel coronary artery disease, with a patent stent in the      mid-left anterior descending and ostial diagonal branch stenosis.  2. Minimal left circumflex and right coronary artery disease.  3. Normal left ventricular function.  Laboratory Data:  High Sensitivity Troponin:   Recent Labs  Lab 10/16/19 1056  TROPONINIHS 1,048*      Chemistry Recent Labs  Lab 10/16/19 1056  NA 137  K 4.5  CL 106  CO2 21*  GLUCOSE 116*  BUN 20  CREATININE 1.14*  CALCIUM 9.9  GFRNONAA 44*  GFRAA 51*  ANIONGAP 10    No results for input(s): PROT, ALBUMIN, AST, ALT, ALKPHOS, BILITOT in the last 168 hours. Hematology Recent Labs  Lab 10/16/19 1056  WBC 7.1  RBC 3.48*  HGB 10.3*  HCT 33.0*  MCV 94.8  MCH 29.6  MCHC 31.2  RDW 12.6  PLT 162   BNPNo results for input(s): BNP, PROBNP in the last 168 hours.  DDimer No results for input(s): DDIMER in the last 168 hours.   Radiology/Studies:  Dg Chest Port 1 View  Result Date: 10/16/2019 CLINICAL DATA:  Chest pain since yesterday. EXAM: PORTABLE CHEST 1 VIEW COMPARISON:  07/02/2019 FINDINGS: The cardiac silhouette, mediastinal and hilar contours are within normal limits and stable stable tortuosity and calcification of the thoracic aorta. Basilar scarring changes but no definite infiltrates or effusions. No worrisome pulmonary lesions. The bony thorax is intact. IMPRESSION: No acute cardiopulmonary findings.  Electronically Signed   By: Rudie Meyer M.D.   On: 10/16/2019 10:00    Assessment and Plan:   1. Unstable angina with elevated troponin at 1048, second pending will add IV heparin.  Will cath if next troponin with further elevation. Will add IV NTG.   If flat plan nuc tomorrow.   2. CAD with prior stent to LAD 1999 and patent with last cath 2008 and mild CAD otherwise at that time.  On ASA 325 mg daily, coreg 25 BID. 3. HTN elevated continue home meds   4. DM-2 on no meds, diet controlled. 5. Polymyalgia rheumatica on prednisone 10 mg daily 6. Seizures on Keppra.   Severity of Illness: The appropriate patient status for this patient is INPATIENT. Inpatient status is judged to be reasonable and necessary in order to provide the required intensity of service to ensure the patient's safety. The patient's presenting symptoms, physical exam findings, and initial radiographic and laboratory data in the context  of their chronic comorbidities is felt to place them at high risk for further clinical deterioration. Furthermore, it is not anticipated that the patient will be medically stable for discharge from the hospital within 2 midnights of admission. The following factors support the patient status of inpatient.   " The patient's presenting symptoms include chest pain . " The worrisome physical exam findings include chest pain. " The initial radiographic and laboratory data are worrisome because of elevated troponin. " The chronic co-morbidities include CAD, HTN, DM2, .   * I certify that at the point of admission it is my clinical judgment that the patient will require inpatient hospital care spanning beyond 2 midnights from the point of admission due to high intensity of service, high risk for further deterioration and high frequency of surveillance required.*    For questions or updates, please contact [place] Please consult [email or web address] for contact info under        Signed,  Cecilie Kicks, NP  10/16/2019 1:06 PM &P

## 2019-10-16 NOTE — ED Triage Notes (Addendum)
Pt arrives with [facility] from home c/o left sided CP that started yesterday afternoon; 10/10 pain. Pt has hx of HTN. Pt states she has not had any CP like this before "that she can remember."  Pt was given 324 aspirin and 1 nitro by EMS en route; pain decreased to 8/10. Pt a&o x4.  EMS vitals:  190/110 HR 70 RR 22 Temp 98.1 CBG 161

## 2019-10-16 NOTE — ED Notes (Signed)
Admitting provider at pt bedside. 

## 2019-10-16 NOTE — Interval H&P Note (Signed)
History and Physical Interval Note:  10/16/2019 4:10 PM  [name]  has presented today for surgery, with the diagnosis of unstable angina-non-STEMI.  The various methods of treatment have been discussed with the patient and family. After consideration of risks, benefits and other options for treatment, the patient has consented to  Procedure(s): LEFT HEART CATH AND CORONARY ANGIOGRAPHY (N/A)  PERCUTANEOUS CORONARY INTERVENTION  as a surgical intervention.  The patient's history has been reviewed, patient examined, no change in status, stable for surgery.  I have reviewed the patient's chart and labs.  Questions were answered to the patient's satisfaction.    Cath Lab Visit (complete for each Cath Lab visit)  Clinical Evaluation Leading to the Procedure:   ACS: Yes.    Non-ACS:    Anginal Classification: CCS III  Anti-ischemic medical therapy: Minimal Therapy (1 class of medications)  Non-Invasive Test Results: No non-invasive testing performed  Prior CABG: No previous CABG   Glenetta Hew

## 2019-10-16 NOTE — Progress Notes (Signed)
Site area: rt fa sheath pulled and pressure held by Sherlyn Lick Site Prior to Removal:  Level 0 Pressure Applied For:  30 minutes Manual:   yes Patient Status During Pull:  stable Post Pull Site:  Level 0 Post Pull Instructions Given:  yes Post Pull Pulses Present: rt dp palpable Dressing Applied:  Gauze and tegaderm Bedrest begins @ 2174 Comments:

## 2019-10-17 ENCOUNTER — Other Ambulatory Visit: Payer: Self-pay | Admitting: Cardiology

## 2019-10-17 ENCOUNTER — Inpatient Hospital Stay (HOSPITAL_COMMUNITY): Payer: Medicare HMO

## 2019-10-17 DIAGNOSIS — I340 Nonrheumatic mitral (valve) insufficiency: Secondary | ICD-10-CM

## 2019-10-17 DIAGNOSIS — I361 Nonrheumatic tricuspid (valve) insufficiency: Secondary | ICD-10-CM

## 2019-10-17 LAB — HEMOGLOBIN A1C
Hgb A1c MFr Bld: 6.6 % — ABNORMAL HIGH (ref 4.8–5.6)
Mean Plasma Glucose: 142.72 mg/dL

## 2019-10-17 LAB — ECHOCARDIOGRAM COMPLETE
Height: 64 in
Weight: 2784 oz

## 2019-10-17 LAB — GLUCOSE, CAPILLARY
Glucose-Capillary: 167 mg/dL — ABNORMAL HIGH (ref 70–99)
Glucose-Capillary: 185 mg/dL — ABNORMAL HIGH (ref 70–99)

## 2019-10-17 MED ORDER — SODIUM CHLORIDE 0.9 % IV SOLN: INTRAVENOUS | Status: DC

## 2019-10-17 MED ORDER — ASPIRIN 81 MG PO TBEC: 81.0000 mg | DELAYED_RELEASE_TABLET | Freq: Every day | ORAL | 1 refills | Status: DC

## 2019-10-17 MED ORDER — ATORVASTATIN CALCIUM 40 MG PO TABS: 40.0000 mg | ORAL_TABLET | Freq: Every day | ORAL | 1 refills | Status: DC

## 2019-10-17 MED ORDER — NITROGLYCERIN 0.4 MG SL SUBL: 0.4000 mg | SUBLINGUAL_TABLET | SUBLINGUAL | 2 refills | Status: DC | PRN

## 2019-10-17 MED ORDER — SODIUM CHLORIDE 0.9 % IV SOLN: 250.0000 mL | INTRAVENOUS | Status: DC | PRN

## 2019-10-17 MED ORDER — CLOPIDOGREL BISULFATE 75 MG PO TABS: 75.0000 mg | ORAL_TABLET | Freq: Every day | ORAL | 2 refills | Status: DC

## 2019-10-17 MED ORDER — ASPIRIN 81 MG PO CHEW: 81.0000 mg | CHEWABLE_TABLET | ORAL | Status: DC

## 2019-10-17 MED ORDER — METHYLPREDNISOLONE SODIUM SUCC 40 MG IJ SOLR: 40.0000 mg | INTRAMUSCULAR | Status: DC

## 2019-10-17 MED ORDER — SODIUM CHLORIDE 0.9% FLUSH: 3.0000 mL | INTRAVENOUS | Status: DC | PRN

## 2019-10-17 NOTE — Progress Notes (Signed)
Progress Note  Patient Name: [name] Date of Encounter: 10/17/2019  Primary Cardiologist:   No primary care provider on file.   Subjective   Confused last night.  She has no acute complaints.  No SOB.  No chest pain. She is still confused but up and eating this morning.    Inpatient Medications    Scheduled Meds: . aspirin EC  81 mg Oral Daily  . atorvastatin  40 mg Oral q1800  . carvedilol  25 mg Oral BID WC  . cholecalciferol  1,000 Units Oral Daily  . clopidogrel  75 mg Oral Daily  . DULoxetine  20 mg Oral Daily  . heparin  5,000 Units Subcutaneous Q8H  . insulin aspart  0-5 Units Subcutaneous QHS  . insulin aspart  0-6 Units Subcutaneous TID WC  . levETIRAcetam  500 mg Oral BID  . predniSONE  10 mg Oral Q breakfast  . sodium chloride flush  3 mL Intravenous Q12H  . sodium chloride flush  3 mL Intravenous Q12H  . vitamin B-12  1,000 mcg Oral Daily   Continuous Infusions: . sodium chloride    . sodium chloride    . famotidine (PEPCID) IV    . nitroGLYCERIN     PRN Meds: sodium chloride, acetaminophen, nitroGLYCERIN, ondansetron (ZOFRAN) IV, sodium chloride flush   Vital Signs    Vitals:   10/16/19 2235 10/16/19 2342 10/16/19 2357 10/17/19 0341  BP: 138/77 (!) 148/77 (!) 160/78 (!) 142/77  Pulse: 83 83 87 73  Resp: 14 12 14 11   Temp:    98.7 F (37.1 C)  TempSrc:      SpO2: 99% 99% 99% 98%  Weight:      Height:        Intake/Output Summary (Last 24 hours) at 10/17/2019 0816 Last data filed at 10/17/2019 0500 Gross per 24 hour  Intake 658.75 ml  Output 350 ml  Net 308.75 ml   Filed Weights   10/16/19 1415  Weight: 78.9 kg    Telemetry    NSR - Personally Reviewed  ECG    NA - Personally Reviewed  Physical Exam   GEN: No acute distress.   Neck: No  JVD Cardiac: RRR, no murmurs, rubs, or gallops.  Respiratory: Clear  to auscultation bilaterally. GI: Soft, nontender, non-distended  MS: No  edema; No deformity.  Right radial  without bleeding or bruising Neuro:  Nonfocal  Psych: Normal affect but confused to place and time.   Labs    Chemistry Recent Labs  Lab 10/16/19 1056  NA 137  K 4.5  CL 106  CO2 21*  GLUCOSE 116*  BUN 20  CREATININE 1.14*  CALCIUM 9.9  GFRNONAA 44*  GFRAA 51*  ANIONGAP 10     Hematology Recent Labs  Lab 10/16/19 1056  WBC 7.1  RBC 3.48*  HGB 10.3*  HCT 33.0*  MCV 94.8  MCH 29.6  MCHC 31.2  RDW 12.6  PLT 162    Cardiac EnzymesNo results for input(s): TROPONINI in the last 168 hours. No results for input(s): TROPIPOC in the last 168 hours.   BNPNo results for input(s): BNP, PROBNP in the last 168 hours.   DDimer No results for input(s): DDIMER in the last 168 hours.   Radiology    Dg Chest Port 1 View  Result Date: 10/16/2019 CLINICAL DATA:  Chest pain since yesterday. EXAM: PORTABLE CHEST 1 VIEW COMPARISON:  07/02/2019 FINDINGS: The cardiac silhouette, mediastinal and hilar contours are within  normal limits and stable stable tortuosity and calcification of the thoracic aorta. Basilar scarring changes but no definite infiltrates or effusions. No worrisome pulmonary lesions. The bony thorax is intact. IMPRESSION: No acute cardiopulmonary findings. Electronically Signed   By: Rudie Meyer M.D.   On: 10/16/2019 10:00    Cardiac Studies     Prox RCA-1 lesion is 20% stenosed.  CULPRIT LESION Prox RCA-2 lesion is 80% stenosed-prox RCA to Mid RCA lesion is 95% stenosed.  A drug-eluting stent was successfully placed covering both lesions, using a STENT RESOLUTE ONYX 3.5X34. Postdilated from 3.9 to 3.8 mm  Post intervention, there is a 0% residual stenosis.  Dist RCA lesion is 30% stenosed.  ---  Mid LAD to Dist LAD lesion is 20% stenosed-in the distal portion of the stent from 1999. 1st Diag lesion is 50% stenosed.  Ramus lesion is 30% stenosed.  ---  There is mild left ventricular systolic dysfunction. The left ventricular ejection fraction is 45-50%  by visual estimate. Distal inferior-inferoapical hypokinesis  LV end diastolic pressure is normal.   SUMMARY  Two-vessel CAD with widely patent LAD stent and CULPRIT LESION being ulcerated, thrombotic/irregular 80 to 95% prox-mid RCA lesion. ? Successful complex DES PCI of mid RCA with RESOLUTE ONYX DES 3.5 mm x 34 mm (3.85 mm).  Otherwise minimal disease elsewhere in RAMUS INTERMEDIUS (RI) and CIRCUMFLEX (LCx).  Initially significant systemic hypertension requiring significant medications, pressures normalized upon arrival to PACU  Suspect mildly reduced EF with inferoapical hypokinesis.    Coronary Diagrams  Diagnostic Dominance: Right  Intervention        Hemo Data   Most Recent Value  AO Systolic Pressure 172 mmHg  AO Diastolic Pressure 66 mmHg  AO Mean 108 mmHg  LV Systolic Pressure 143 mmHg  LV Diastolic Pressure 1 mmHg  LV EDP 3 mmHg  AOp Systolic Pressure 170 mmHg  AOp Diastolic Pressure 86 mmHg  AOp Mean Pressure 122 mmHg  LVp Systolic Pressure 158 mmHg  LVp Diastolic Pressure 1 mmHg  LVp EDP Pressure 3 mmHg    Patient Profile     85 y.o. female is a 85 y.o. female with CAD, multiple allergies, DM-2, HTN, hx DVT, seizures, Polymyalgia rheumatica, B12 def,  GERD, pancreatitis, thoracic aortic aneurysm now seen for chest pain.   Assessment & Plan    Unstable angina:  Cath as above.  PCI.   She has no pain.  No further work up is suggested.  Continue medical management.    I spoke with her nurse and the patient and her daughter.  I think that confusion, risk of fall is significant if we keep her in the hospital.  She is stable for discharge.  I think she would be safer at home.  OK to discharge.   Discharge after echo.    CAD:  As above.    HTN:  BP OK.  Continue current meds.   DM:   A1C 6.6.  Continue current therapy.    Seizures:  On Keppra.  Continue.     For questions or updates, please contact [facility] Please consult [email or web address] for  contact info under Cardiology/STEMI.   Signed, Rollene Rotunda, MD  10/17/2019, 8:16 AM

## 2019-10-17 NOTE — Progress Notes (Signed)
Patient awoke disoriented and confused.  She thinks she is at home and is calling out for her family members.  Patient unable to be reoriented by staff and becoming more agitated.  Assisted to [facility] to void.  Patient's daughter, [name], was called by charge nurse to inform her of pt's status and request for a family member to come sit with her, and the patient was allowed to talk to her daughter on the phone.  After talking with her daughter, the patient's agitation improved.  Upon leaving the room around 5 am, patient was calm and cooperative, lying down in bed with bed alarm on.  Patient's family states they cannot come stay with her at this time, but staff can call them back if patient becomes confused and agitated again.  Jodell Cipro

## 2019-10-17 NOTE — Progress Notes (Signed)
  Echocardiogram 2D Echocardiogram has been performed.  Johny Chess 10/17/2019, 10:40 AM

## 2019-10-17 NOTE — Discharge Summary (Addendum)
Discharge Summary    Patient ID: [name],  MRN: [id number], DOB/AGE: 83/03/1933 83 y.o.  Admit date: 10/16/2019 Discharge date: 10/17/2019  Primary Care Provider: Tresa GarterPlotnikov, Aleksei V Primary Cardiologist: Rollene RotundaJames Elowyn Raupp, MD  Discharge Diagnoses    Principal Problem:   Unstable angina [facility](HCC) Active Problems:   Type 2 diabetes mellitus with diabetic nephropathy, without long-term current use of insulin (HCC)   Hyperlipidemia   Essential hypertension   Non-ST elevation (NSTEMI) myocardial infarction (HCC)   Allergies Allergies  Allergen Reactions  . Ativan [Lorazepam]     Very confused  . Tramadol Hcl Anxiety and Rash    Headache  . Amlodipine Besylate Other (See Comments)     dizzy  . Atenolol Other (See Comments)    Fatigue  . Benazepril Cough  . Benicar [Olmesartan Medoxomil] Other (See Comments)    HEADACHE  . Cozaar     nausea  . Hydralazine     Hair loss  . Hydrochlorothiazide W-Triamterene Other (See Comments)     dizzy  . Hydrocodone Other (See Comments)    HEADACHE  . Hydroxyzine Pamoate Other (See Comments)    Per MAR  . Iodine Other (See Comments)    Per MAR  . Lisinopril Other (See Comments) and Cough    Tired & fatigue  . Peach Flavor Itching  . Penicillins Itching    Has patient had a PCN reaction causing immediate rash, facial/tongue/throat swelling, SOB or lightheadedness with hypotension: No Has patient had a PCN reaction causing severe rash involving mucus membranes or skin necrosis: No Has patient had a PCN reaction that required hospitalization No Has patient had a PCN reaction occurring within the last 10 years: Yes If all of the above answers are "NO", then may proceed with Cephalosporin use.  tolerates cephalosporins OK   . Pravastatin Other (See Comments)    Myalgias-muscle pain  . Prednisolone Nausea Only and Other (See Comments)    Upset stomach  . Strawberry Flavor Itching  . Tramadol Hcl Other (See Comments)   headache  . Benadryl [Diphenhydramine] Itching and Palpitations    Diagnostic Studies/Procedures    Cath: 10/16/19   Prox RCA-1 lesion is 20% stenosed.  CULPRIT LESION Prox RCA-2 lesion is 80% stenosed-prox RCA to Mid RCA lesion is 95% stenosed.  A drug-eluting stent was successfully placed covering both lesions, using a STENT RESOLUTE ONYX 3.5X34. Postdilated from 3.9 to 3.8 mm  Post intervention, there is a 0% residual stenosis.  Dist RCA lesion is 30% stenosed.  ---  Mid LAD to Dist LAD lesion is 20% stenosed-in the distal portion of the stent from 1999. 1st Diag lesion is 50% stenosed.  Ramus lesion is 30% stenosed.  ---  There is mild left ventricular systolic dysfunction. The left ventricular ejection fraction is 45-50% by visual estimate. Distal inferior-inferoapical hypokinesis  LV end diastolic pressure is normal.   SUMMARY  Two-vessel CAD with widely patent LAD stent and CULPRIT LESION being ulcerated, thrombotic/irregular 80 to 95% prox-mid RCA lesion. ? Successful complex DES PCI of mid RCA with RESOLUTE ONYX DES 3.5 mm x 34 mm (3.85 mm).  Otherwise minimal disease elsewhere in RAMUS INTERMEDIUS (RI) and CIRCUMFLEX (LCx).  Initially significant systemic hypertension requiring significant medications, pressures normalized upon arrival to PACU  Suspect mildly reduced EF with inferoapical hypokinesis.   RECOMMENDATIONS  Admit to stepdown unit/progressive care unit for ongoing care.  Was on IV nitroglycerin infusion will be discontinued after PACU.  We will need  to appropriately address blood pressure medication regimen.  Will need pain meds for back pain until bedrest over  Based on the extent of CAD and her advanced age, would probably opt to not discharge on 10/17/2019, preferably 10/18/2019.   Bryan Lemma, M.D., M.S. Interventional Cardiologist     Diagnostic Dominance: Right  Intervention    TTE: 10/17/19   IMPRESSIONS    1.  Left ventricular ejection fraction, by visual estimation, is 60 to 65%. The left ventricle has normal function. There is no left ventricular hypertrophy.  2. Left ventricular diastolic parameters are consistent with Grade I diastolic dysfunction (impaired relaxation).  3. Global right ventricle has normal systolic function.The right ventricular size is mildly enlarged. No increase in right ventricular wall thickness.  4. Left atrial size was mildly dilated.  5. Right atrial size was mildly dilated.  6. Mild to moderate mitral annular calcification.  7. Moderate thickening of the mitral valve leaflet(s).  8. The mitral valve is normal in structure. Mild to moderate mitral valve regurgitation. No evidence of mitral stenosis.  9. The tricuspid valve is normal in structure. Tricuspid valve regurgitation moderate. 10. The aortic valve is normal in structure. Aortic valve regurgitation is trivial. No evidence of aortic valve sclerosis or stenosis. 11. The pulmonic valve was normal in structure. Pulmonic valve regurgitation is not visualized. 12. Mildly elevated pulmonary artery systolic pressure. 13. The inferior vena cava is normal in size with greater than 50% respiratory variability, suggesting right atrial pressure of 3 mmHg. _____________   History of Present Illness     [name] is a 85 y.o. female with CAD, multiple allergies, DM-2, HTN, hx DVT, seizures, Polymyalgia rheumatica, B12 def,  GERD, pancreatitis, thoracic aortic aneurysm now seen for chest pain. [name] has not been seen by cardiology since 2015. Pt with above hx and she did have hx of cardiac cath 1999 with stent to LAD, cath 2008 with nomal LAD and 30-40% restenosis D1 50% stenosis, D2 80% stenosis, LCX and RCA minimal plaque.  With the 2008 cath her CK was elevated but cath stable. Echo 2017 with EF 50% moderate concentric hypertrophy.  G1DD   Seen in Oct by PCP with UTI.   Now admitted after presenting to ER by EMS.   She had left sided chest pain that began yesterday.  Never had this type of pain before.   She noted pain began 3 weeks ago though poor historian.  It has been present most of time.  Could not do regular activities because of pain.   The night prior to admission chest pain was bad 10/10 and called EMS the following day.  On assessment in the ED her pain was 5/10. Some nausea but she has had nausea for some time.  Some SOB.     EKG:  The ECG that was done 10/16/19  was personally reviewed and demonstrated SR at 80 and RBBB and LPFB minimal sT elevation inf leads.  Troponin 1048 Na 137, K+ 4.5, BUN 20 Cr 1.14  Hgb 10.3 WBC 7.1 plts 162  PCXR  No acute cardiopulmonary findings.  Given her elevated troponin she was taken for cardiac cath.   Hospital Course     Underwent cardiac catheterization noted above with two-vessel CAD with widely patent previously placed LAD stent and culprit lesion felt to be ulcerated thrombotic irregular proximal to mid RCA lesion.  Successful but complex PCI/DES of the mid RCA x1.  Otherwise minimal disease in the ramus intermedius and  left circumflex.  Placed on DAPT with aspirin/Plavix for a minimum of 1 year.  High-sensitivity troponin peaked at 3696.  Follow-up echocardiogram showed an EF of 60 to 65% with normal LV function.  Grade 1 diastolic dysfunction with mildly dilated atria.  She was placed on high intensity statin.  Continued on home dose of beta-blocker.  Have some mild confusion overnight post cath but able to sit up and eat breakfast the following morning.  It was felt that her confusion was likely related to her hospitalization and would improve once discharged back to her home setting.  This was discussed via Dr. Antoine Poche with her daughter prior to discharge she was agreeable.  She was seen by cardiac rehab prior to discharge.   [name] was seen by Dr. Antoine Poche and determined stable for discharge home. Follow up in the office has been arranged.  Medications are listed below.   _____________  Discharge Vitals Blood pressure (!) 142/77, pulse 73, temperature 98.7 F (37.1 C), resp. rate 11, height 5\' 4"  (1.626 m), weight 78.9 kg, SpO2 98 %.  Filed Weights   10/16/19 1415  Weight: 78.9 kg    Labs & Radiologic Studies    CBC Recent Labs    10/16/19 1056  WBC 7.1  NEUTROABS 5.0  HGB 10.3*  HCT 33.0*  MCV 94.8  PLT 162   Basic Metabolic Panel Recent Labs    14/04/20 1056  NA 137  K 4.5  CL 106  CO2 21*  GLUCOSE 116*  BUN 20  CREATININE 1.14*  CALCIUM 9.9   Liver Function Tests No results for input(s): AST, ALT, ALKPHOS, BILITOT, PROT, ALBUMIN in the last 72 hours. No results for input(s): LIPASE, AMYLASE in the last 72 hours. Cardiac Enzymes No results for input(s): CKTOTAL, CKMB, CKMBINDEX, TROPONINI in the last 72 hours. BNP Invalid input(s): POCBNP D-Dimer No results for input(s): DDIMER in the last 72 hours. Hemoglobin A1C Recent Labs    10/16/19 2315  HGBA1C 6.6*   Fasting Lipid Panel No results for input(s): CHOL, HDL, LDLCALC, TRIG, CHOLHDL, LDLDIRECT in the last 72 hours. Thyroid Function Tests No results for input(s): TSH, T4TOTAL, T3FREE, THYROIDAB in the last 72 hours.  Invalid input(s): FREET3 _____________  Dg Chest Port 1 View  Result Date: 10/16/2019 CLINICAL DATA:  Chest pain since yesterday. EXAM: PORTABLE CHEST 1 VIEW COMPARISON:  07/02/2019 FINDINGS: The cardiac silhouette, mediastinal and hilar contours are within normal limits and stable stable tortuosity and calcification of the thoracic aorta. Basilar scarring changes but no definite infiltrates or effusions. No worrisome pulmonary lesions. The bony thorax is intact. IMPRESSION: No acute cardiopulmonary findings. Electronically Signed   By: 07/04/2019 M.D.   On: 10/16/2019 10:00   Disposition   Pt is being discharged home today in good condition.  Follow-up Plans & Appointments    Follow-up Information    14/02/2019, MD Follow up.   Specialty: Cardiology Why: Office will call you with a follow up appt. If you have not received a call by 12/8 please call 14/8 to schedule.  Contact information: [address] [place] [phone]          Discharge Instructions    Diet - low sodium heart healthy   Complete by: As directed    Discharge instructions   Complete by: As directed    Radial Site Care Refer to this sheet in the next few weeks. These instructions provide you with information on caring  for yourself after your procedure. Your caregiver may also give you more specific instructions. Your treatment has been planned according to current medical practices, but problems sometimes occur. Call your caregiver if you have any problems or questions after your procedure. HOME CARE INSTRUCTIONS You may shower the day after the procedure.Remove the bandage (dressing) and gently wash the site with plain soap and water.Gently pat the site dry.  Do not apply powder or lotion to the site.  Do not submerge the affected site in water for 3 to 5 days.  Inspect the site at least twice daily.  Do not flex or bend the affected arm for 24 hours.  No lifting over 5 pounds (2.3 kg) for 5 days after your procedure.  What to expect: Any bruising will usually fade within 1 to 2 weeks.  Blood that collects in the tissue (hematoma) may be painful to the touch. It should usually decrease in size and tenderness within 1 to 2 weeks.  SEEK IMMEDIATE MEDICAL CARE IF: You have unusual pain at the radial site.  You have redness, warmth, swelling, or pain at the radial site.  You have drainage (other than a small amount of blood on the dressing).  You have chills.  You have a fever or persistent symptoms for more than 72 hours.  You have a fever and your symptoms suddenly get worse.  Your arm becomes pale, cool, tingly, or numb.  You have heavy bleeding from the site. Hold pressure on the site.    PLEASE DO NOT MISS ANY DOSES OF YOUR PLAVIX!!!!! Also keep a log of you blood pressures and bring back to your follow up appt. Please call the office with any questions.   Patients taking blood thinners should generally stay away from medicines like ibuprofen, Advil, Motrin, naproxen, and Aleve due to risk of stomach bleeding. You may take Tylenol as directed or talk to your primary doctor about alternatives.   If you notice any bleeding such as blood in stool, black tarry stools, blood in urine, nosebleeds or any other unusual bleeding, call your doctor immediately. It is not normal to have this kind of bleeding while on a blood thinner and usually indicates there is an underlying problem with one of your body systems that needs to be checked out.   Increase activity slowly   Complete by: As directed        Discharge Medications     Medication List    TAKE these medications   Accu-Chek Aviva Soln Use as directed   acetaminophen 500 MG tablet Commonly known as: TYLENOL Take 500 mg by mouth every 6 (six) hours as needed.   aspirin 81 MG EC tablet Take 1 tablet (81 mg total) by mouth daily. Start taking on: October 18, 2019   atorvastatin 40 MG tablet Commonly known as: LIPITOR Take 1 tablet (40 mg total) by mouth daily at 6 PM.   B-D SINGLE USE SWABS REGULAR Pads Use to clean area to check blood sugars twice a day   carvedilol 25 MG tablet Commonly known as: COREG TAKE 1 TABLET BY MOUTH TWICE DAILY WITH MEALS   cholecalciferol 1000 units tablet Commonly known as: VITAMIN D Take 1,000 Units by mouth daily.   clopidogrel 75 MG tablet Commonly known as: PLAVIX Take 1 tablet (75 mg total) by mouth daily. Start taking on: October 18, 2019   cyclobenzaprine 5 MG tablet Commonly known as: FLEXERIL Take 1 tablet (5 mg total) by mouth at bedtime  as needed for muscle spasms.   DULoxetine 20 MG capsule Commonly known as: Cymbalta Take 1 capsule (20 mg total) by mouth  daily.   furosemide 20 MG tablet Commonly known as: LASIX Take 1 tablet (20 mg total) by mouth daily as needed. Leg swelling   glucose blood test strip Commonly known as: OneTouch Verio Use as instructed   levETIRAcetam 500 MG tablet Commonly known as: KEPPRA TAKE 1 TABLET TWICE DAILY   nitroGLYCERIN 0.4 MG SL tablet Commonly known as: NITROSTAT Place 1 tablet (0.4 mg total) under the tongue every 5 (five) minutes x 3 doses as needed for chest pain.   ondansetron 4 MG tablet Commonly known as: ZOFRAN TAKE 1 TABLET BY MOUTH TWICE DAILY AS NEEDED FOR NAUSEA FOR VOMITING What changed: See the new instructions.   OneTouch Delica Lancets Fine Misc 1 Device by Does not apply route daily as needed.   predniSONE 5 MG tablet Commonly known as: DELTASONE Take 2 tablets (10 mg total) by mouth daily with breakfast.   vitamin B-12 1000 MCG tablet Commonly known as: CYANOCOBALAMIN Take 1,000 mcg by mouth daily.        Yes                               AHA/ACC Clinical Performance & Quality Measures: 1. Aspirin prescribed? - Yes 2. ADP Receptor Inhibitor (Plavix/Clopidogrel, Brilinta/Ticagrelor or Effient/Prasugrel) prescribed (includes medically managed patients)? - Yes 3. Beta Blocker prescribed? - Yes 4. High Intensity Statin (Lipitor 40-80mg  or Crestor 20-40mg ) prescribed? - Yes 5. EF assessed during THIS hospitalization? - Yes 6. For EF <40%, was ACEI/ARB prescribed? - Not Applicable (EF >/= 40%) 7. For EF <40%, Aldosterone Antagonist (Spironolactone or Eplerenone) prescribed? - Not Applicable (EF >/= 40%) 8. Cardiac Rehab Phase II ordered (Included Medically managed Patients)? - Yes      Outstanding Labs/Studies   FLP/LFTs in 8 weeks  Duration of Discharge Encounter   Greater than 30 minutes including physician time.  Signed, Laverda PageLindsay Roberts NP-C 10/17/2019, 11:23 AM   Patient seen and examined.  Plan as discussed in my rounding note for today and outlined above.  Fayrene FearingJames [facility]  10/17/2019  11:38 AM

## 2019-10-17 NOTE — Progress Notes (Addendum)
CARDIAC REHAB PHASE I   PRE:  Rate/Rhythm: 81 nsr  BP:  Supine:   Sitting: 165/76  Standing:    SaO2: 97 ra  MODE:  Ambulation: 60 ft   POST:  Rate/Rhythem: 78 nsr  BP:  Supine:   Sitting: 136/68  Standing:    SaO2: 98  1050-1150 Patient ambulated very slowly with RW x 1 assist. Patient has significant arthritis in knees bilat that limits ambulation. Per daughter, patient not candidate for orthopedic surgery secondary to heart condition. Mild dementia noted as patient repeated self during general conversation. Per daughter, she is unsure if patient is appropriate for phase 2 CR, but request order be placed for further discussion. Agree with daughters assessment as patient unable to tolerate ambulation or repetitive knee motion such as recumbent bike. Order will be placed per daughter request to [place]. MI discharge teaching complete including antiplatelet therapy, stent card, diet and exercise, and activity progression. Patient will be discharged later today as D/C order has been placed.  Pt IS NOT interested in participating in Virtual Cardiac and Pulmonary Rehab. Pt advised that Virtual Cardiac and Pulmonary Rehab is provided at no cost to the patient.  Checklist:  1. Pt has smart device  ie smartphone and/or ipad for downloading an app  No 2. Reliable internet/wifi service    No 3. Understands how to use their smartphone and navigate within an app.  No   Pt verbalized understanding and is in agreement.  Anaiz Qazi Minus Breeding

## 2019-10-19 ENCOUNTER — Encounter (HOSPITAL_COMMUNITY): Payer: Self-pay | Admitting: Cardiology

## 2019-10-19 ENCOUNTER — Telehealth: Payer: Self-pay | Admitting: Cardiology

## 2019-10-19 NOTE — Telephone Encounter (Signed)
Patient contacted regarding discharge from12/02/2019 - 10/17/2019 (27 hours)   [place]  Patient understands to follow up with provider LUKE on12/16 at 11:30am AT [place] Patient understands discharge instructions? YES, SHE HAS NO QUESTIONS SHE WILL CALL BACK WITH ANY FURTHER QUESTIONS Patient understands medications and regiment? YES Patient understands to bring all medications to this visit? YES SHE WILL ARRIVE EARLY ALONE AND IN A MASK

## 2019-10-19 NOTE — Telephone Encounter (Signed)
-----   Message from Cheryln Manly, NP sent at 10/17/2019 11:17 AM EST ----- Regarding: Follow up appt Please schedule this patient for a follow-up appointment and call them with that information.  Primary Cardiologist: Dr. Percival Spanish Date of Discharge: 10/17/2019 Appointment Needed Within: 7-10 Appointment Type: TOC with APP is ok  Thank you! Reino Bellis, NP-C

## 2019-10-19 NOTE — Telephone Encounter (Signed)
Patient has TOC appt with Kerin Ransom on 12/16 at 11:30am.

## 2019-10-20 ENCOUNTER — Other Ambulatory Visit: Payer: Self-pay

## 2019-10-20 NOTE — Patient Outreach (Signed)
[facility]) Care Management  10/20/2019  [name] [birth year] [id number]      EMMI-General Discharge RED ON EMMI ALERT Day # 1 Date: 10/19/2019 Red Alert Reason: "Know who to call about changes in condition? No Unfilled prescriptions? Yes  Able to fill today/tomorrow? No"   Outreach attempt #1 to patient. Spoke with patient who denies any acute issues or concerns at preset. She voices that she is tired and resting. Reviewed and addressed red alerts with patient. She reports she has MDs contact info and knows how to contact them if needed. She has cardiology appt on 10/28/2019.She has not called PCP office to make an appt but voices she will do so later today. She states that her daughter takes her to appts. Her son is in the home with her at present and helping her out some. Patient reports she had a question regarding one med-Duloxetine. She did not know what it was for so she did not take it. Reviewed with patient name of med, ordered dosage and purpose. Patient able to report she did not recognize the name as she was used to calling it something else. She reports that she was indeed on depression med prior to hospitalization. She denies any further RN CM needs or concerns at this time. She has declined [facility] services at this time.    Plan: RN CM will close case at this time.   Enzo Montgomery, RN,BSN,CCM [place] [facility] Telephonic Care Management Coordinator Direct Phone: [phone] Toll Free: [phone] Fax: [phone]

## 2019-10-21 ENCOUNTER — Telehealth (HOSPITAL_COMMUNITY): Payer: Self-pay

## 2019-10-21 NOTE — Telephone Encounter (Signed)
Called and spoke with pt in regards to CR, pt stated she is not able to participate at this time.  Closed referral

## 2019-10-23 ENCOUNTER — Other Ambulatory Visit: Payer: Self-pay

## 2019-10-23 NOTE — Patient Outreach (Signed)
[facility]) Care Management  10/23/2019  [name] 18-Jan-1933 [id number]    EMMI-General Discharge RED ON EMMI ALERT Day # 4 Date:10/22/2019 Red Alert Reason: "Sad/hopeless/anxious/empty? Yes"   Outreach attempt # 1 to patient. Spoke with patient who denies any acute issues or concerns.  Reviewed and addressed red alert with patient. She shares that she has a history of depression. She reports that her depression has not been worse lately. She does admit to some feelings of feeling sad given her recent hospitalization. She reports that she is taking anti-depressant. She has PCP follow up appt on Tuesday. RN CM advised patient to discuss her feelings/concerns with MD. She voiced understanding. She declines any further RN CM needs or concerns and has completed automated post discharge follow up calls.    Plan: RN CM will close case at this time.   Enzo Montgomery, RN,BSN,CCM [place] Management Telephonic Care Management Coordinator Direct Phone: [phone] Toll Free: [phone] Fax: [phone]

## 2019-10-27 ENCOUNTER — Encounter: Payer: Self-pay | Admitting: Internal Medicine

## 2019-10-27 ENCOUNTER — Ambulatory Visit (INDEPENDENT_AMBULATORY_CARE_PROVIDER_SITE_OTHER): Payer: Medicare HMO | Admitting: Internal Medicine

## 2019-10-27 ENCOUNTER — Other Ambulatory Visit: Payer: Self-pay

## 2019-10-27 ENCOUNTER — Inpatient Hospital Stay: Payer: Medicare HMO | Admitting: Internal Medicine

## 2019-10-27 VITALS — BP 140/90 | HR 77 | Temp 98.6°F | Ht 64.0 in | Wt 174.0 lb

## 2019-10-27 DIAGNOSIS — I200 Unstable angina: Secondary | ICD-10-CM

## 2019-10-27 DIAGNOSIS — I130 Hypertensive heart and chronic kidney disease with heart failure and stage 1 through stage 4 chronic kidney disease, or unspecified chronic kidney disease: Secondary | ICD-10-CM | POA: Diagnosis not present

## 2019-10-27 DIAGNOSIS — M353 Polymyalgia rheumatica: Secondary | ICD-10-CM | POA: Diagnosis not present

## 2019-10-27 DIAGNOSIS — E538 Deficiency of other specified B group vitamins: Secondary | ICD-10-CM

## 2019-10-27 DIAGNOSIS — R5383 Other fatigue: Secondary | ICD-10-CM

## 2019-10-27 DIAGNOSIS — E1121 Type 2 diabetes mellitus with diabetic nephropathy: Secondary | ICD-10-CM | POA: Diagnosis not present

## 2019-10-27 DIAGNOSIS — I2510 Atherosclerotic heart disease of native coronary artery without angina pectoris: Secondary | ICD-10-CM | POA: Diagnosis not present

## 2019-10-27 DIAGNOSIS — F419 Anxiety disorder, unspecified: Secondary | ICD-10-CM | POA: Diagnosis not present

## 2019-10-27 DIAGNOSIS — I5030 Unspecified diastolic (congestive) heart failure: Secondary | ICD-10-CM | POA: Diagnosis not present

## 2019-10-27 MED ORDER — VITAMIN D3 50 MCG (2000 UT) PO CAPS: 2000.0000 [IU] | ORAL_CAPSULE | Freq: Every day | ORAL | 3 refills | Status: DC

## 2019-10-27 MED ORDER — METHYLPREDNISOLONE ACETATE 80 MG/ML IJ SUSP
80.0000 mg | Freq: Once | INTRAMUSCULAR | Status: AC
  Administered 2019-10-27: 80 mg via INTRAMUSCULAR

## 2019-10-27 MED ORDER — COQ10 200 MG PO CAPS: ORAL_CAPSULE | ORAL | 3 refills | Status: DC

## 2019-10-27 NOTE — Assessment & Plan Note (Signed)
Doing fair 

## 2019-10-27 NOTE — Assessment & Plan Note (Signed)
On B12 

## 2019-10-27 NOTE — Assessment & Plan Note (Signed)
Cont ASA, Plavis, Lipitor 2020 12/20 [place] - s/p RCA STENT placement No CP

## 2019-10-27 NOTE — Assessment & Plan Note (Signed)
CFS 

## 2019-10-27 NOTE — Progress Notes (Signed)
Subjective:  Patient ID: [name], female    DOB: October 01, 1933  Age: 85 y.o. MRN: [id number]  CC: No chief complaint on file.   HPI [name] presents for post-hosp f/u - pt had [place] - s/p RCA STENT placement F/u FMS, chronic pain  Per hx: " Admit date: 10/16/2019  Discharge date: 10/17/2019  Primary Care Provider: Cassandria Anger  Primary Cardiologist: Minus Breeding, MD  Discharge Diagnoses  Principal Problem:  Unstable angina [facility])  Active Problems:  Type 2 diabetes mellitus with diabetic nephropathy, without long-term current use of insulin ([place])  Hyperlipidemia  Essential hypertension  Non-ST elevation (NSTEMI) myocardial infarction ([place])   Allergies       Allergies  Allergen Reactions  . Ativan [Lorazepam]     Very confused  . Tramadol Hcl Anxiety and Rash    Headache  . Amlodipine Besylate Other (See Comments)    dizzy  . Atenolol Other (See Comments)    Fatigue  . Benazepril Cough  . Benicar [Olmesartan Medoxomil] Other (See Comments)    HEADACHE  . Cozaar     nausea  . Hydralazine     Hair loss  . Hydrochlorothiazide W-Triamterene Other (See Comments)    dizzy  . Hydrocodone Other (See Comments)    HEADACHE  . Hydroxyzine Pamoate Other (See Comments)    Per MAR  . Iodine Other (See Comments)    Per MAR  . Lisinopril Other (See Comments) and Cough    Tired & fatigue  . Peach Flavor Itching  . Penicillins Itching    Has patient had a PCN reaction causing immediate rash, facial/tongue/throat swelling, SOB or lightheadedness with hypotension: No  Has patient had a PCN reaction causing severe rash involving mucus membranes or skin necrosis: No  Has patient had a PCN reaction that required hospitalization No  Has patient had a PCN reaction occurring within the last 10 years: Yes  If all of the above answers are "NO", then may proceed with Cephalosporin use.  tolerates cephalosporins OK   . Pravastatin Other (See Comments)   Myalgias-muscle pain  . Prednisolone Nausea Only and Other (See Comments)    Upset stomach  . Strawberry Flavor Itching  . Tramadol Hcl Other (See Comments)    headache  . Benadryl [Diphenhydramine] Itching and Palpitations   Diagnostic Studies/Procedures  Cath: 10/16/19  Prox RCA-1 lesion is 20% stenosed.  CULPRIT LESION Prox RCA-2 lesion is 80% stenosed-prox RCA to Mid RCA lesion is 95% stenosed.  A drug-eluting stent was successfully placed covering both lesions, using a STENT RESOLUTE ONYX 3.5X34. Postdilated from 3.9 to 3.8 mm  Post intervention, there is a 0% residual stenosis.  Dist RCA lesion is 30% stenosed.  ---  Mid LAD to Dist LAD lesion is 20% stenosed-in the distal portion of the stent from 1999. 1st Diag lesion is 50% stenosed.  Ramus lesion is 30% stenosed.  ---  There is mild left ventricular systolic dysfunction. The left ventricular ejection fraction is 45-50% by visual estimate. Distal inferior-inferoapical hypokinesis  LV end diastolic pressure is normal. SUMMARY  Two-vessel CAD with widely patent LAD stent and CULPRIT LESION being ulcerated, thrombotic/irregular 80 to 95% prox-mid RCA lesion.  Successful complex DES PCI of mid RCA with RESOLUTE ONYX DES 3.5 mm x 34 mm (3.85 mm). Otherwise minimal disease elsewhere in RAMUS INTERMEDIUS (RI) and CIRCUMFLEX (LCx).  Initially significant systemic hypertension requiring significant medications, pressures normalized upon arrival to PACU  Suspect mildly reduced EF with  inferoapical hypokinesis. RECOMMENDATIONS  Admit to stepdown unit/progressive care unit for ongoing care.  Was on IV nitroglycerin infusion will be discontinued after PACU.  We will need to appropriately address blood pressure medication regimen.  Will need pain meds for back pain until bedrest over  Based on the extent of CAD and her advanced age, would probably opt to not discharge on 10/17/2019, preferably 10/18/2019. Bryan Lemma, M.D., M.S.    Interventional Cardiologist   Diagnostic  Dominance: Right   Intervention   TTE: 10/17/19  IMPRESSIONS  1. Left ventricular ejection fraction, by visual estimation, is 60 to 65%. The left ventricle has normal function. There is no left ventricular hypertrophy.  2. Left ventricular diastolic parameters are consistent with Grade I diastolic dysfunction (impaired relaxation).  3. Global right ventricle has normal systolic function.The right ventricular size is mildly enlarged. No increase in right ventricular wall thickness.  4. Left atrial size was mildly dilated.  5. Right atrial size was mildly dilated.  6. Mild to moderate mitral annular calcification.  7. Moderate thickening of the mitral valve leaflet(s).  8. The mitral valve is normal in structure. Mild to moderate mitral valve regurgitation. No evidence of mitral stenosis.  9. The tricuspid valve is normal in structure. Tricuspid valve regurgitation moderate.  10. The aortic valve is normal in structure. Aortic valve regurgitation is trivial. No evidence of aortic valve sclerosis or stenosis.  11. The pulmonic valve was normal in structure. Pulmonic valve regurgitation is not visualized.  12. Mildly elevated pulmonary artery systolic pressure.  13. The inferior vena cava is normal in size with greater than 50% respiratory variability, suggesting right atrial pressure of 3 mmHg.  _____________  History of Present Illness   [name] is a 85 y.o. female with CAD, multiple allergies, DM-2, HTN, hx DVT, seizures, Polymyalgia rheumatica, B12 def, GERD, pancreatitis, thoracic aortic aneurysm now seen for chest pain. Ms. Roberge has not been seen by cardiology since 2015. Pt with above hx and she did have hx of cardiac cath 1999 with stent to LAD, cath 2008 with nomal LAD and 30-40% restenosis D1 50% stenosis, D2 80% stenosis, LCX and RCA minimal plaque. With the 2008 cath her CK was elevated but cath stable. Echo 2017 with EF 50%  moderate concentric hypertrophy. G1DD Seen in Oct by PCP with UTI.  Now admitted after presenting to ER by EMS. She had left sided chest pain that began yesterday. Never had this type of pain before. She noted pain began 3 weeks ago though poor historian. It has been present most of time. Could not do regular activities because of pain. The night prior to admission chest pain was bad 10/10 and called EMS the following day. On assessment in the ED her pain was 5/10. Some nausea but she has had nausea for some time. Some SOB.  EKG: The ECG that was done 10/16/19 was personally reviewed and demonstrated SR at 80 and RBBB and LPFB minimal sT elevation inf leads.  Troponin 1048  Na 137, K+ 4.5, BUN 20 Cr 1.14  Hgb 10.3 WBC 7.1 plts 162  PCXR No acute cardiopulmonary findings.  Given her elevated troponin she was taken for cardiac cath.  Hospital Course  Underwent cardiac catheterization noted above with two-vessel CAD with widely patent previously placed LAD stent and culprit lesion felt to be ulcerated thrombotic irregular proximal to mid RCA lesion. Successful but complex PCI/DES of the mid RCA x1. Otherwise minimal disease in the ramus intermedius and left  circumflex. Placed on DAPT with aspirin/Plavix for a minimum of 1 year. High-sensitivity troponin peaked at 3696. Follow-up echocardiogram showed an EF of 60 to 65% with normal LV function. Grade 1 diastolic dysfunction with mildly dilated atria. She was placed on high intensity statin. Continued on home dose of beta-blocker. Have some mild confusion overnight post cath but able to sit up and eat breakfast the following morning. It was felt that her confusion was likely related to her hospitalization and would improve once discharged back to her home setting. This was discussed via Dr. Antoine Poche with her daughter prior to discharge she was agreeable. She was seen by cardiac rehab prior to discharge.  [name] was seen by Dr. Antoine Poche and determined  stable for discharge home. Follow up in the office has been arranged. Medications are listed below. "   Outpatient Medications Prior to Visit  Medication Sig Dispense Refill  . acetaminophen (TYLENOL) 500 MG tablet Take 500 mg by mouth every 6 (six) hours as needed.     . Alcohol Swabs (B-D SINGLE USE SWABS REGULAR) PADS Use to clean area to check blood sugars twice a day 300 each 1  . aspirin 81 MG EC tablet Take 1 tablet (81 mg total) by mouth daily. 90 tablet 1  . atorvastatin (LIPITOR) 40 MG tablet Take 1 tablet (40 mg total) by mouth daily at 6 PM. 90 tablet 1  . Blood Glucose Calibration (ACCU-CHEK AVIVA) SOLN Use as directed 3 each 1  . carvedilol (COREG) 25 MG tablet TAKE 1 TABLET BY MOUTH TWICE DAILY WITH MEALS 14 tablet 0  . cholecalciferol (VITAMIN D) 1000 units tablet Take 1,000 Units by mouth daily.    . clopidogrel (PLAVIX) 75 MG tablet Take 1 tablet (75 mg total) by mouth daily. 90 tablet 2  . cyclobenzaprine (FLEXERIL) 5 MG tablet Take 1 tablet (5 mg total) by mouth at bedtime as needed for muscle spasms. 30 tablet 3  . DULoxetine (CYMBALTA) 20 MG capsule Take 1 capsule (20 mg total) by mouth daily. 90 capsule 0  . furosemide (LASIX) 20 MG tablet Take 1 tablet (20 mg total) by mouth daily as needed. Leg swelling 90 tablet 3  . glucose blood (ONETOUCH VERIO) test strip Use as instructed 300 each 3  . levETIRAcetam (KEPPRA) 500 MG tablet TAKE 1 TABLET TWICE DAILY 180 tablet 1  . nitroGLYCERIN (NITROSTAT) 0.4 MG SL tablet Place 1 tablet (0.4 mg total) under the tongue every 5 (five) minutes x 3 doses as needed for chest pain. 25 tablet 2  . ondansetron (ZOFRAN) 4 MG tablet TAKE 1 TABLET BY MOUTH TWICE DAILY AS NEEDED FOR NAUSEA FOR VOMITING (Patient taking differently: Take 4 mg by mouth every 8 (eight) hours as needed for nausea or vomiting. ) 30 tablet 0  . ONETOUCH DELICA LANCETS FINE MISC 1 Device by Does not apply route daily as needed. 100 each 3  . predniSONE (DELTASONE) 5 MG  tablet Take 2 tablets (10 mg total) by mouth daily with breakfast. 180 tablet 1  . vitamin B-12 (CYANOCOBALAMIN) 1000 MCG tablet Take 1,000 mcg by mouth daily.     No facility-administered medications prior to visit.    ROS: Review of Systems  Constitutional: Positive for fatigue. Negative for activity change, appetite change, chills and unexpected weight change.  HENT: Negative for congestion, mouth sores and sinus pressure.   Eyes: Negative for visual disturbance.  Respiratory: Negative for cough and chest tightness.   Gastrointestinal: Negative for abdominal  pain and nausea.  Genitourinary: Negative for difficulty urinating, frequency and vaginal pain.  Musculoskeletal: Positive for arthralgias, back pain, gait problem, joint swelling, myalgias, neck pain and neck stiffness.  Skin: Negative for pallor and rash.  Neurological: Positive for weakness and light-headedness. Negative for dizziness, tremors, numbness and headaches.  Hematological: Bruises/bleeds easily.  Psychiatric/Behavioral: Positive for dysphoric mood and sleep disturbance. Negative for confusion and suicidal ideas. The patient is nervous/anxious.     Objective:  There were no vitals taken for this visit.  BP Readings from Last 3 Encounters:  10/17/19 (!) 142/77  08/25/19 136/84  06/23/19 (!) 142/80    Wt Readings from Last 3 Encounters:  10/16/19 174 lb (78.9 kg)  08/25/19 174 lb (78.9 kg)  06/23/19 184 lb (83.5 kg)    Physical Exam Constitutional:      General: She is not in acute distress.    Appearance: She is well-developed. She is obese.  HENT:     Head: Normocephalic.     Right Ear: External ear normal.     Left Ear: External ear normal.     Nose: Nose normal.  Eyes:     General:        Right eye: No discharge.        Left eye: No discharge.     Conjunctiva/sclera: Conjunctivae normal.     Pupils: Pupils are equal, round, and reactive to light.  Neck:     Thyroid: No thyromegaly.      Vascular: No JVD.     Trachea: No tracheal deviation.  Cardiovascular:     Rate and Rhythm: Normal rate and regular rhythm.     Heart sounds: Normal heart sounds.  Pulmonary:     Effort: No respiratory distress.     Breath sounds: No stridor. No wheezing.  Abdominal:     General: Bowel sounds are normal. There is no distension.     Palpations: Abdomen is soft. There is no mass.     Tenderness: There is no abdominal tenderness. There is no guarding or rebound.  Musculoskeletal:        General: No tenderness.     Cervical back: Normal range of motion and neck supple.  Lymphadenopathy:     Cervical: No cervical adenopathy.  Skin:    Findings: No erythema or rash.  Neurological:     Cranial Nerves: No cranial nerve deficit.     Motor: Weakness present. No abnormal muscle tone.     Coordination: Coordination normal.     Gait: Gait abnormal.     Deep Tendon Reflexes: Reflexes normal.  Psychiatric:        Behavior: Behavior normal.        Thought Content: Thought content normal.        Judgment: Judgment normal.   w/c  Lab Results  Component Value Date   WBC 7.1 10/16/2019   HGB 10.3 (L) 10/16/2019   HCT 33.0 (L) 10/16/2019   PLT 162 10/16/2019   GLUCOSE 116 (H) 10/16/2019   CHOL 169 06/25/2015   TRIG 61 06/25/2015   HDL 57 06/25/2015   LDLDIRECT 114.8 01/10/2011   LDLCALC 100 (H) 06/25/2015   ALT 12 (L) 03/26/2018   AST 24 03/26/2018   NA 137 10/16/2019   K 4.5 10/16/2019   CL 106 10/16/2019   CREATININE 1.14 (H) 10/16/2019   BUN 20 10/16/2019   CO2 21 (L) 10/16/2019   TSH 2.89 12/26/2017   INR 1.09 07/17/2016   HGBA1C 6.6 (  H) 10/16/2019    CARDIAC CATHETERIZATION  Result Date: 10/19/2019  Prox RCA-1 lesion is 20% stenosed.  CULPRIT LESION Prox RCA-2 lesion is 80% stenosed-prox RCA to Mid RCA lesion is 95% stenosed.  A drug-eluting stent was successfully placed covering both lesions, using a STENT RESOLUTE ONYX 3.5X34. Postdilated from 3.9 to 3.8 mm  Post  intervention, there is a 0% residual stenosis.  Dist RCA lesion is 30% stenosed.  ---  Mid LAD to Dist LAD lesion is 20% stenosed-in the distal portion of the stent from 1999. 1st Diag lesion is 50% stenosed.  Ramus lesion is 30% stenosed.  ---  There is mild left ventricular systolic dysfunction. The left ventricular ejection fraction is 45-50% by visual estimate. Distal inferior-inferoapical hypokinesis  LV end diastolic pressure is normal.  SUMMARY  Two-vessel CAD with widely patent LAD stent and CULPRIT LESION being ulcerated, thrombotic/irregular 80 to 95% prox-mid RCA lesion.  Successful complex DES PCI of mid RCA with RESOLUTE ONYX DES 3.5 mm x 34 mm (3.85 mm).  Otherwise minimal disease elsewhere in RAMUS INTERMEDIUS (RI) and CIRCUMFLEX (LCx).  Initially significant systemic hypertension requiring significant medications, pressures normalized upon arrival to PACU  Suspect mildly reduced EF with inferoapical hypokinesis. RECOMMENDATIONS  Admit to stepdown unit/progressive care unit for ongoing care.  Was on IV nitroglycerin infusion will be discontinued after PACU.  We will need to appropriately address blood pressure medication regimen.  Will need pain meds for back pain until bedrest over  Based on the extent of CAD and her advanced age, would probably opt to not discharge on 10/17/2019, preferably 10/18/2019. Bryan Lemma, M.D., M.S. Interventional Cardiologist    DG Chest Port 1 View  Result Date: 10/16/2019 CLINICAL DATA:  Chest pain since yesterday. EXAM: PORTABLE CHEST 1 VIEW COMPARISON:  07/02/2019 FINDINGS: The cardiac silhouette, mediastinal and hilar contours are within normal limits and stable stable tortuosity and calcification of the thoracic aorta. Basilar scarring changes but no definite infiltrates or effusions. No worrisome pulmonary lesions. The bony thorax is intact. IMPRESSION: No acute cardiopulmonary findings. Electronically Signed   By: Rudie Meyer M.D.   On:  10/16/2019 10:00   ECHOCARDIOGRAM COMPLETE  Result Date: 10/17/2019   ECHOCARDIOGRAM REPORT   Patient Name:   [facility]LE [facility] Date of Exam: 10/17/2019 Medical Rec #:  [id number]        Height:       64.0 in Accession #:    [id number]       Weight:       174.0 lb Date of Birth:  [birth year]         BSA:          1.84 m Patient Age:    86 years         BP:           142/77 mmHg Patient Gender: F                HR:           77 bpm. Exam Location:  Inpatient Procedure: 2D Echo Indications:    chest pain 786.50  History:        Patient has prior history of Echocardiogram examinations, most                 recent 07/18/2016. CHF, CAD; Signs/Symptoms:Chest Pain.  Sonographer:    Delcie Roch Referring Phys: [id number] LAURA R INGOLD IMPRESSIONS  1. Left ventricular ejection fraction, by visual estimation, is 60 to 65%.  The left ventricle has normal function. There is no left ventricular hypertrophy.  2. Left ventricular diastolic parameters are consistent with Grade I diastolic dysfunction (impaired relaxation).  3. Global right ventricle has normal systolic function.The right ventricular size is mildly enlarged. No increase in right ventricular wall thickness.  4. Left atrial size was mildly dilated.  5. Right atrial size was mildly dilated.  6. Mild to moderate mitral annular calcification.  7. Moderate thickening of the mitral valve leaflet(s).  8. The mitral valve is normal in structure. Mild to moderate mitral valve regurgitation. No evidence of mitral stenosis.  9. The tricuspid valve is normal in structure. Tricuspid valve regurgitation moderate. 10. The aortic valve is normal in structure. Aortic valve regurgitation is trivial. No evidence of aortic valve sclerosis or stenosis. 11. The pulmonic valve was normal in structure. Pulmonic valve regurgitation is not visualized. 12. Mildly elevated pulmonary artery systolic pressure. 13. The inferior vena cava is normal in size with greater than 50% respiratory variability,  suggesting right atrial pressure of 3 mmHg. FINDINGS  Left Ventricle: Left ventricular ejection fraction, by visual estimation, is 60 to 65%. The left ventricle has normal function. There is no left ventricular hypertrophy. Left ventricular diastolic parameters are consistent with Grade I diastolic dysfunction (impaired relaxation). Normal left atrial pressure. Right Ventricle: The right ventricular size is mildly enlarged. No increase in right ventricular wall thickness. Global RV systolic function is has normal systolic function. The tricuspid regurgitant velocity is 2.87 m/s, and with an assumed right atrial  pressure of 3 mmHg, the estimated right ventricular systolic pressure is mildly elevated at 35.9 mmHg. Left Atrium: Left atrial size was mildly dilated. Right Atrium: Right atrial size was mildly dilated Pericardium: There is no evidence of pericardial effusion. Mitral Valve: The mitral valve is normal in structure. There is moderate thickening of the mitral valve leaflet(s). Mild to moderate mitral annular calcification. No evidence of mitral valve stenosis by observation. Mild to moderate mitral valve regurgitation. Tricuspid Valve: The tricuspid valve is normal in structure. Tricuspid valve regurgitation moderate. Aortic Valve: The aortic valve is normal in structure.. There is moderate thickening and mild calcification of the aortic valve. Aortic valve regurgitation is trivial. The aortic valve is structurally normal, with no evidence of sclerosis or stenosis. There is moderate thickening of the aortic valve. There is mild calcification of the aortic valve. Pulmonic Valve: The pulmonic valve was normal in structure. Pulmonic valve regurgitation is not visualized. Aorta: The aortic root, ascending aorta and aortic arch are all structurally normal, with no evidence of dilitation or obstruction. Venous: The inferior vena cava is normal in size with greater than 50% respiratory variability, suggesting right  atrial pressure of 3 mmHg. IAS/Shunts: No atrial level shunt detected by color flow Doppler. There is no evidence of a patent foramen ovale. No ventricular septal defect is seen or detected. There is no evidence of an atrial septal defect.  LEFT VENTRICLE PLAX 2D LVIDd:         5.00 cm LVIDs:         3.40 cm LV PW:         1.10 cm LV IVS:        0.90 cm LVOT diam:     2.00 cm LV SV:         71 ml LV SV Index:   37.01 LVOT Area:     3.14 cm  RIGHT VENTRICLE RV S prime:     7.07 cm/s TAPSE (M-mode):  1.2 cm LEFT ATRIUM             Index       RIGHT ATRIUM           Index LA diam:        4.10 cm 2.22 cm/m  RA Area:     12.00 cm LA Vol (A2C):   49.8 ml 27.01 ml/m RA Volume:   27.60 ml  14.97 ml/m LA Vol (A4C):   67.4 ml 36.55 ml/m LA Biplane Vol: 57.8 ml 31.35 ml/m  AORTIC VALVE LVOT Vmax:   97.90 cm/s LVOT Vmean:  67.300 cm/s LVOT VTI:    0.220 m  AORTA Ao Root diam: 3.40 cm MITRAL VALVE                         TRICUSPID VALVE MV Area (PHT): 3.65 cm              TR Peak grad:   32.9 mmHg MV PHT:        60.32 msec            TR Vmax:        287.00 cm/s MV Decel Time: 208 msec MV E velocity: 97.40 cm/s  103 cm/s  SHUNTS MV A velocity: 133.00 cm/s 70.3 cm/s Systemic VTI:  0.22 m MV E/A ratio:  0.73        1.5       Systemic Diam: 2.00 cm  Tobias Alexander MD Electronically signed by Tobias Alexander MD Signature Date/Time: 10/17/2019/10:55:28 AM    Final     Assessment & Plan:   There are no diagnoses linked to this encounter.   No orders of the defined types were placed in this encounter.    Follow-up: No follow-ups on file.  [name], MD

## 2019-10-27 NOTE — Assessment & Plan Note (Signed)
Chronic sx's s/p RCA STENT placement

## 2019-10-27 NOTE — Assessment & Plan Note (Signed)
BP Readings from Last 3 Encounters:  10/27/19 140/90  10/17/19 (!) 142/77  08/25/19 136/84

## 2019-10-27 NOTE — Assessment & Plan Note (Signed)
[place] - s/p RCA STENT placement

## 2019-10-27 NOTE — Assessment & Plan Note (Signed)
Labs

## 2019-10-28 ENCOUNTER — Ambulatory Visit: Payer: Medicare HMO | Admitting: Cardiology

## 2019-10-30 ENCOUNTER — Ambulatory Visit: Payer: Medicare HMO | Admitting: Cardiology

## 2019-11-01 NOTE — Progress Notes (Signed)
Cardiology Office Note   Date:  11/02/2019   ID:  [name], DOB 1933-07-01, MRN [id number]  PCP:  Tresa Garter, MD  Cardiologist:  Dr.Hochrein  CC: Post hospital follow up.    History of Present Illness: [name] is a 85 y.o. female who presents for posthospitalization follow-up after admission for chest pain with subsequent cardiac catheterization revealing two-vessel CAD with widely patent previously placed stent to the LAD with culprit lesion felt to be an ulcerated thrombotic irregular proximal to mid RCA lesion.  She underwent a successful but complex PCI/DES of the mid RCA x1.    She is placed on dual antiplatelet therapy with aspirin and Plavix for minimum of 1 year.  She had a follow-up echocardiogram which revealed an EF of 60% to 65% with normal LV function and grade 1 diastolic dysfunction with mildly dilated atria.  She was also placed on high-dose statin, atorvastatin 40 mg at at bedtime, continued on her home dose of carvedilol 25 mg twice daily.  She was noted to have some transient confusion during her hospitalization, but had returned to normal at discharge.  [name] has other history to include type 2 diabetes, hypertension, history of DVT, seizures, polymyalgia rheumatica, B12 deficiency, GERD, pancreatitis, and thoracic aortic aneurysm.  She comes today without complaints of chest pain, bu has overall fatigue. She is not very active, and uses a walker for ambulation. She states that she has a chair lift for her stairs to assist her in climbing them. She has some mild DOE . No active bleeding or bruising. She refused cardiac rehab due to COVID fears.   Past Medical History:  Diagnosis Date  . Anemia    iron deficiency  . Aneurysm, thoracic aortic (HCC)   . Anxiety   . B12 deficiency   . CAD (coronary artery disease)    s/p stenting of LAD 1999- cath 5-08 EF normal LAD 30-40% restenosis. D1 50% D2 80% LCX & RCA minimal plaque  . Chronic back  pain   . Constipation   . Depression   . Diabetes mellitus   . DVT (deep venous thrombosis) (HCC)   . GERD (gastroesophageal reflux disease)   . Gout   . HTN (hypertension)   . Hyperlipemia   . Obesity   . Osteoporosis   . Pancreatitis   . Polyarthritis    DJD/ possible PMR  . Renal insufficiency    Cr 1.2-1.3  . Seizures (HCC)   . Tinnitus   . Urinary frequency   . Vertigo   . Vitamin D deficiency     Past Surgical History:  Procedure Laterality Date  . ABDOMINAL HYSTERECTOMY    . CARDIAC CATHETERIZATION  2008   L main 20%, LAD stent patent, D1 50%, D2 80% (small), RCA 20%, EF 55-60%  . CHOLECYSTECTOMY    . CORONARY ANGIOPLASTY WITH STENT PLACEMENT  1999   LAD stent  . CORONARY STENT INTERVENTION N/A 10/16/2019   Procedure: CORONARY STENT INTERVENTION;  Surgeon: Marykay Lex, MD;  Location: [facility]VAS[facility] CV LAB;  Service: Cardiovascular;  Laterality: N/A;  . HEMORRHOID SURGERY    . LAPAROSCOPIC LYSIS OF ADHESIONS N/A 03/24/2015   Procedure: LAPAROSCOPIC LYSIS OF ADHESIONS;  Surgeon: Luretha Murphy, MD;  Location: [facility] ORS;  Service: General;  Laterality: N/A;  . LAPAROSCOPY N/A 03/24/2015   Procedure: LAPAROSCOPY DIAGNOSTIC;  Surgeon: Luretha Murphy, MD;  Location: [facility] OR[facility];  Service: General;  Laterality: N/A;  . LAPAROTOMY N/A 03/24/2015  Procedure: LAPAROTOMY with decompression of bowel;  Surgeon: Luretha MurphyMatthew Martin, MD;  Location: [facility] OR[facility];  Service: General;  Laterality: N/A;  . LEFT HEART CATH AND CORONARY ANGIOGRAPHY N/A 10/16/2019   Procedure: LEFT HEART CATH AND CORONARY ANGIOGRAPHY;  Surgeon: Marykay LexHarding, David W, MD;  [facility];  Service: Cardiovascular;  Laterality: N/A;  . TUBAL LIGATION       Current Outpatient Medications  Medication Sig Dispense Refill  . acetaminophen (TYLENOL) 500 MG tablet Take 500 mg by mouth every 6 (six) hours as needed.     . Alcohol Swabs (B-D SINGLE USE SWABS REGULAR) PADS Use to clean area to check blood sugars twice a  day 300 each 1  . aspirin 81 MG EC tablet Take 1 tablet (81 mg total) by mouth daily. 90 tablet 1  . atorvastatin (LIPITOR) 40 MG tablet Take 1 tablet (40 mg total) by mouth daily at 6 PM. 90 tablet 1  . Blood Glucose Calibration (ACCU-CHEK AVIVA) SOLN Use as directed 3 each 1  . carvedilol (COREG) 25 MG tablet TAKE 1 TABLET BY MOUTH TWICE DAILY WITH MEALS 14 tablet 0  . Cholecalciferol (VITAMIN D3) 50 MCG (2000 UT) capsule Take 1 capsule (2,000 Units total) by mouth daily. 100 capsule 3  . clopidogrel (PLAVIX) 75 MG tablet Take 1 tablet (75 mg total) by mouth daily. 90 tablet 2  . Coenzyme Q10 (COQ10) 200 MG CAPS One daily 100 capsule 3  . cyclobenzaprine (FLEXERIL) 5 MG tablet Take 1 tablet (5 mg total) by mouth at bedtime as needed for muscle spasms. 30 tablet 3  . DULoxetine (CYMBALTA) 20 MG capsule Take 1 capsule (20 mg total) by mouth daily. 90 capsule 0  . furosemide (LASIX) 20 MG tablet Take 1 tablet (20 mg total) by mouth daily as needed. Leg swelling 90 tablet 3  . glucose blood (ONETOUCH VERIO) test strip Use as instructed 300 each 3  . levETIRAcetam (KEPPRA) 500 MG tablet TAKE 1 TABLET TWICE DAILY 180 tablet 1  . nitroGLYCERIN (NITROSTAT) 0.4 MG SL tablet Place 1 tablet (0.4 mg total) under the tongue every 5 (five) minutes x 3 doses as needed for chest pain. 25 tablet 2  . ondansetron (ZOFRAN) 4 MG tablet TAKE 1 TABLET BY MOUTH TWICE DAILY AS NEEDED FOR NAUSEA FOR VOMITING (Patient taking differently: Take 4 mg by mouth every 8 (eight) hours as needed for nausea or vomiting. ) 30 tablet 0  . ONETOUCH DELICA LANCETS FINE MISC 1 Device by Does not apply route daily as needed. 100 each 3  . predniSONE (DELTASONE) 5 MG tablet Take 2 tablets (10 mg total) by mouth daily with breakfast. 180 tablet 1  . vitamin B-12 (CYANOCOBALAMIN) 1000 MCG tablet Take 1,000 mcg by mouth daily.     No current facility-administered medications for this visit.    Allergies:   Ativan [lorazepam], Tramadol  hcl, Amlodipine besylate, Atenolol, Benazepril, Benicar [olmesartan medoxomil], Cozaar, Hydralazine, Hydrochlorothiazide w-triamterene, Hydrocodone, Hydroxyzine pamoate, Iodine, Lisinopril, Peach flavor, Penicillins, Pravastatin, Prednisolone, Strawberry flavor, Tramadol hcl, and Benadryl [diphenhydramine]    Social History:  The patient  reports that she has never smoked. She has never used smokeless tobacco. She reports that she does not drink alcohol or use drugs.   Family History:  The patient's family history includes Diabetes in her mother; Hypertension in her father. She was adopted.    ROS: All other systems are reviewed and negative. Unless otherwise mentioned in H&P    PHYSICAL EXAM: VS:  BP 125/74   Pulse 76   Ht 5\' 4"  (1.626 m)   Wt 169 lb 3.2 oz (76.7 kg)   SpO2 97%   BMI 29.04 kg/m  , BMI Body mass index is 29.04 kg/m. GEN: Well nourished, well developed, in no acute distress HEENT: normal Neck: no JVD, carotid bruits, or masses Cardiac: RRR; no murmurs, rubs, or gallops,no edema  Respiratory:  Clear to auscultation bilaterally, normal work of breathing GI: soft, nontender, nondistended, + BS MS: no deformity or atrophy Skin: warm and dry, no rash.Mild bruising to cath insertion site.  Neuro:  Strength and sensation are intact Psych: euthymic mood, full affect   EKG:  Not completed this office visit.  Recent Labs: 10/16/2019: BUN 20; Creatinine, Ser 1.14; Hemoglobin 10.3; Platelets 162; Potassium 4.5; Sodium 137    Lipid Panel    Component Value Date/Time   CHOL 169 06/25/2015 0410   TRIG 61 06/25/2015 0410   HDL 57 06/25/2015 0410   CHOLHDL 3.0 06/25/2015 0410   VLDL 12 06/25/2015 0410   LDLCALC 100 (H) 06/25/2015 0410   LDLDIRECT 114.8 01/10/2011 0923      Wt Readings from Last 3 Encounters:  11/02/19 169 lb 3.2 oz (76.7 kg)  10/27/19 174 lb (78.9 kg)  10/16/19 174 lb (78.9 kg)      Other studies Reviewed: Cath: 10/16/19   Prox RCA-1 lesion  is 20% stenosed.  CULPRIT LESION Prox RCA-2 lesion is 80% stenosed-prox RCA to Mid RCA lesion is 95% stenosed.  A drug-eluting stent was successfully placed covering both lesions, using a STENT RESOLUTE ONYX 3.5X34. Postdilated from 3.9 to 3.8 mm  Post intervention, there is a 0% residual stenosis.  Dist RCA lesion is 30% stenosed.  ---  Mid LAD to Dist LAD lesion is 20% stenosed-in the distal portion of the stent from 1999. 1st Diag lesion is 50% stenosed.  Ramus lesion is 30% stenosed.  ---  There is mild left ventricular systolic dysfunction. The left ventricular ejection fraction is 45-50% by visual estimate. Distal inferior-inferoapical hypokinesis  LV end diastolic pressure is normal.  SUMMARY  Two-vessel CAD with widely patent LAD stent and CULPRIT LESION being ulcerated, thrombotic/irregular 80 to 95% prox-mid RCA lesion. ? Successful complex DES PCI of mid RCA with RESOLUTE ONYX DES 3.5 mm x 34 mm (3.85 mm).  Otherwise minimal disease elsewhere in RAMUS INTERMEDIUS (RI) and CIRCUMFLEX (LCx).  Initially significant systemic hypertension requiring significant medications, pressures normalized upon arrival to PACU  Suspect mildly reduced EF with inferoapical hypokinesis.   RECOMMENDATIONS  Admit to stepdown unit/progressive care unit for ongoing care.  Was on IV nitroglycerin infusion will be discontinued after PACU.  We will need to appropriately address blood pressure medication regimen.  Will need pain meds for back pain until bedrest over  Based on the extent of CAD and her advanced age, would probably opt to not discharge on 10/17/2019, preferably 10/18/2019.   Bryan Lemma, M.D., M.S. Interventional Cardiologist    Diagnostic Dominance: Right  Intervention    TTE: 10/17/19   IMPRESSIONS   1. Left ventricular ejection fraction, by visual estimation, is 60 to 65%. The left ventricle has normal function. There is no left ventricular  hypertrophy. 2. Left ventricular diastolic parameters are consistent with Grade I diastolic dysfunction (impaired relaxation). 3. Global right ventricle has normal systolic function.The right ventricular size is mildly enlarged. No increase in right ventricular wall thickness. 4. Left atrial size was mildly dilated. 5. Right atrial size was mildly  dilated. 6. Mild to moderate mitral annular calcification. 7. Moderate thickening of the mitral valve leaflet(s). 8. The mitral valve is normal in structure. Mild to moderate mitral valve regurgitation. No evidence of mitral stenosis. 9. The tricuspid valve is normal in structure. Tricuspid valve regurgitation moderate. 10. The aortic valve is normal in structure. Aortic valve regurgitation is trivial. No evidence of aortic valve sclerosis or stenosis. 11. The pulmonic valve was normal in structure. Pulmonic valve regurgitation is not visualized. 12. Mildly elevated pulmonary artery systolic pressure. 13. The inferior vena cava is normal in size with greater than 50% respiratory variability, suggesting right atrial pressure of 3 mmHg. _____________  ASSESSMENT AND PLAN:  1. CAD:  S/P cath revealing 2 vessel CAD. She required DES to the RCA. She continues on Coreg, Plavix, ASA, and statin therapy. She complains of fatigue. I have discussed cardiac rehab but she is still timid about the [place] pandemic. She may consider in home PT.  She wants to talk to her daughters about it first.  No changes in regimen. I think increasing her activity may help with stamina and energy.   2. Hyperlipidemia: Continues on atorvastatin 40 ng daily. Recheck lipids and LFTs in 3 months.   3. Hypertension;  BP is well controlled today. No changes in her regimen.   Current medicines are reviewed at length with the patient today.    Labs/ tests ordered today include: None  Phill Myron. West Pugh, ANP, [facility]   11/02/2019 10:36 AM    [facility]  [facility] [address] Office [phone] Fax [phone]  Notice: This dictation was prepared with Dragon dictation along with smaller phrase technology. Any transcriptional errors that result from this process are unintentional and may not be corrected upon review.

## 2019-11-02 ENCOUNTER — Encounter: Payer: Self-pay | Admitting: Adult Health

## 2019-11-02 ENCOUNTER — Ambulatory Visit (INDEPENDENT_AMBULATORY_CARE_PROVIDER_SITE_OTHER): Payer: Medicare HMO | Admitting: Adult Health

## 2019-11-02 ENCOUNTER — Other Ambulatory Visit: Payer: Self-pay

## 2019-11-02 VITALS — BP 125/74 | HR 76 | Ht 64.0 in | Wt 169.2 lb

## 2019-11-02 DIAGNOSIS — I10 Essential (primary) hypertension: Secondary | ICD-10-CM

## 2019-11-02 DIAGNOSIS — I2510 Atherosclerotic heart disease of native coronary artery without angina pectoris: Secondary | ICD-10-CM | POA: Diagnosis not present

## 2019-11-02 DIAGNOSIS — E7800 Pure hypercholesterolemia, unspecified: Secondary | ICD-10-CM

## 2019-11-02 NOTE — Patient Instructions (Signed)
Medication Instructions:  Continue current medications  If you need a refill on your cardiac medications before your next appointment, please call your pharmacy.  Labwork: None Ordered   Testing/Procedures: None Ordered  PLEASE READ AND FOLLOW SALTY 6 ATTACHED  Reduce your risk of getting COVID-19 With your heart disease it is especially important for people at increased risk of severe illness from COVID-19, and those who live with them, to protect themselves from getting COVID-19. The best way to protect yourself and to help reduce the spread of the virus that causes COVID-19 is to: . Limit your interactions with other people as much as possible. . Take precautions to prevent getting COVID-19 when you do interact with others. If you start feeling sick and think you may have COVID-19, get in touch with your healthcare provider within 24 hours.  Follow-Up: IN 3 months Please call our office 2 months in advance,  to schedule this appointment. In Person James Hochrein, MD.    At [facility], you and your health needs are our priority.  As part of our continuing mission to provide you with exceptional heart care, we have created designated Provider Care Teams.  These Care Teams include your primary Cardiologist (physician) and Advanced Practice Providers (APPs -  Physician Assistants and Nurse Practitioners) who all work together to provide you with the care you need, when you need it.  Thank you for choosing [facility]!!     Happy Holidays!!        

## 2019-11-03 ENCOUNTER — Ambulatory Visit: Payer: Self-pay

## 2019-11-03 ENCOUNTER — Telehealth: Payer: Self-pay | Admitting: Cardiology

## 2019-11-03 NOTE — Telephone Encounter (Signed)
Returned call to patient she stated for the past 1 week she has noticed after she takes her morning medications her eyes are dry.States feels like cotton in eyes.Stated she has electric heat.Advised to use a humidifier and eye drops over the counter for dry eyes.Advised to continue all medications and call back if her eyes continue to be dry.Advised I will send message to Dr.Hochrein.

## 2019-11-03 NOTE — Telephone Encounter (Signed)
Incoming call from Patient who complains of her eyes feels like there is cotton is in her eyes.  Denies pain.  Onset over a week ago.  Small amount of blurred vision.  Denies wearing glasses and contacts.  Patient states it doesn't occur until after she takes her morning medications.   Patient feels its related to one medications that she takes.  Patient request a return phone call back please.        Answer Assessment - Initial Assessment Questions 1. TYPE OF FOREIGN BODY: "What got in the eye?"      " I feel like I got cotton in my eye 2. ONSET: "When did it happen?"      Over a week ago 3. MECHANISM: "How did it happen?"      4. VISION: "Do you have blurred vision?"     Small amount it is blurred 5. PAIN: "Is it painful?" If so, ask: "How bad is the pain?"  (Scale 1-10; or mild, moderate, severe)   denies 6. CONTACTS: "Do you wear contacts?"    denies 7. OTHER SYMPTOMS: "Do you have any other symptoms?"     denies 8. PREGNANCY: "Is there any chance you are pregnant?" "When was your last menstrual period?"     na  Protocols used: EYE - FOREIGN BODY-A-AH

## 2019-11-03 NOTE — Telephone Encounter (Signed)
Please advise 

## 2019-11-03 NOTE — Telephone Encounter (Signed)
Agree 

## 2019-11-03 NOTE — Telephone Encounter (Signed)
Pt c/o medication issue:  1. Name of Medication: aspirin 81 MG EC tablet, Cholecalciferol (VITAMIN D3) 50 MCG (2000 UT) capsule, clopidogrel (PLAVIX) 75 MG tablet, predniSONE (DELTASONE) 5 MG tablet  2. How are you currently taking this medication (dosage and times per day)? Every morning, as directed  3. Are you having a reaction (difficulty breathing--STAT)? Blurry eyes  4. What is your medication issue? Patient states that it feels like there is cotton in her eyes. I told her that some of this medication was not prescribed by the Doctor and she stated that she still would like me to ask if these are common symptoms of these medications. States if she does not hear from [place] she will contact her PCP.

## 2019-11-04 NOTE — Telephone Encounter (Signed)
I reviewed meds - no new meds Pls see an eye doctor for a check up Thx

## 2019-11-04 NOTE — Telephone Encounter (Signed)
Spoke with patient and info given 

## 2019-11-24 ENCOUNTER — Telehealth: Payer: Self-pay | Admitting: Internal Medicine

## 2019-11-24 NOTE — Telephone Encounter (Signed)
I do not believe this is a new problem.  The family can pick up a urine collection container and bring a specimen for UA and culture.  Try to catch midstream.  Thanks

## 2019-11-24 NOTE — Telephone Encounter (Signed)
Please advise 

## 2019-11-24 NOTE — Telephone Encounter (Signed)
Scheduler contacted patient to offer appointment. Patient declined to schedule at this time due to transportation issues. Patient would like advice from MD   Copied from CRM [id number]. Topic: General - Other >> Nov 24, 2019  9:54 AM Herby Abraham C wrote: Reason for CRM: pt called in to be advised. Pt says that she is urinating to much and would like to be advised by provider. Pt says that she think that it could be one of her medications, she's not sure.   Pt would like further assistance.    CB: [phone]

## 2019-11-25 NOTE — Telephone Encounter (Signed)
Pt.notified

## 2019-12-01 ENCOUNTER — Telehealth: Payer: Self-pay | Admitting: Internal Medicine

## 2019-12-01 ENCOUNTER — Telehealth: Payer: Self-pay | Admitting: Cardiology

## 2019-12-01 NOTE — Telephone Encounter (Signed)
Attempted to call patient. Phone rang with no answer

## 2019-12-01 NOTE — Telephone Encounter (Signed)
Please advise 

## 2019-12-01 NOTE — Telephone Encounter (Signed)
New message   Patient states that she is having numbness in her toes. Please advise.

## 2019-12-01 NOTE — Telephone Encounter (Signed)
Copied from CRM [id number]. Topic: General - Inquiry >> Dec 01, 2019 11:26 AM Floria Raveling A wrote: Reason for CRM: pt called in and state that her toes get numb and get a little shaky some times.  She stated that she would like for me to talk send message to see what Dr Macario Golds said before she made an appt because she has such a hard time getting anywhere  Best number [phone]

## 2019-12-02 NOTE — Telephone Encounter (Signed)
Please get diabetic socks.  Try a pain relieving foot cream over-the-counter.  Prescription tablets would be a problem with her multiple allergies.  Thanks

## 2019-12-02 NOTE — Telephone Encounter (Signed)
Called the pt and she reports that she has been having numbness and tingling in her toes for the past 3-4 weeks.[name] no change in color, no edema, no pain. She also reports that she has been urinating excessively and sometimes has burning and pain with urination.   I have asked her to talk with Dr. Posey Rea about her urination... she had already called his office with the information and there is a note in the chart that I have relayed to the pt and have also let her know that his office should be calling her back.. Dr. Posey Rea has recommended... Diabetic socks and for an OTC pain relieving cream since she allergic to many PO medications.   I am unsure which OTC he usually recommends and advised the pt that someone from his office should be calling her back. To let us know if she has any further problems after trying the recommendations from her PCP.

## 2019-12-04 NOTE — Telephone Encounter (Signed)
Pt.notified

## 2019-12-16 ENCOUNTER — Other Ambulatory Visit: Payer: Self-pay

## 2019-12-18 ENCOUNTER — Other Ambulatory Visit: Payer: Self-pay

## 2019-12-18 MED ORDER — NITROGLYCERIN 0.4 MG SL SUBL: 0.4000 mg | SUBLINGUAL_TABLET | SUBLINGUAL | 3 refills | Status: DC | PRN

## 2019-12-18 MED ORDER — CLOPIDOGREL BISULFATE 75 MG PO TABS: 75.0000 mg | ORAL_TABLET | Freq: Every day | ORAL | 3 refills | Status: DC

## 2019-12-18 MED ORDER — ATORVASTATIN CALCIUM 40 MG PO TABS: 40.0000 mg | ORAL_TABLET | Freq: Every day | ORAL | 3 refills | Status: DC

## 2019-12-29 ENCOUNTER — Telehealth: Payer: Self-pay

## 2019-12-29 NOTE — Telephone Encounter (Signed)
She is okay to get the vaccine.  Thanks

## 2019-12-29 NOTE — Telephone Encounter (Signed)
Please advise 

## 2019-12-29 NOTE — Telephone Encounter (Signed)
New message    The patient has a COVID vaccine appt on 01/01/20 @ 3:30 pm    Please advise on health history if the patient should proceed or not.

## 2019-12-30 NOTE — Telephone Encounter (Signed)
Pt.notified

## 2020-01-01 ENCOUNTER — Other Ambulatory Visit: Payer: Self-pay

## 2020-01-06 ENCOUNTER — Telehealth: Payer: Self-pay | Admitting: Cardiology

## 2020-01-06 ENCOUNTER — Other Ambulatory Visit: Payer: Self-pay

## 2020-01-06 MED ORDER — ONDANSETRON HCL 4 MG PO TABS: ORAL_TABLET | ORAL | 0 refills | Status: DC

## 2020-01-06 NOTE — Telephone Encounter (Signed)
Please advise, I contacted patient- she states she has been having these issues since starting these medications.  Patient called in December with these issues, and has tried other things, but the issues are still there. Patient states she just does not feel good and has no energy to do anything.   I advised I would send a message to MD to advise on Plavix, but would also send a message to PharmD to advise on side effects.

## 2020-01-06 NOTE — Telephone Encounter (Signed)
Pt c/o medication issue:  1. Name of Medication: clopidogrel (PLAVIX) 75 MG tablet  atorvastatin (LIPITOR) 40 MG tablet   2. How are you currently taking this medication (dosage and times per day)? Plavix 1 tablet by mouth daily Lipitor 1 tablet by mouth daily at 6 PM   3. Are you having a reaction (difficulty breathing--STAT)? Yes   4. What is your medication issue? [name] is calling stating she is having a reaction to the medications. She states her eyes feel as if there is cotton in them, she is having blurred vision, fatigue, and nausea. She would like to be taken off of both these medications and be prescribed something else. Please advise.

## 2020-01-06 NOTE — Telephone Encounter (Signed)
I would not suspect that Plavix is causing these symptoms.  He would be high risk to discontinue the Plavix so I would continue this.  We can change the Lipitor stopping this and starting Crestor 20 mg daily though again I doubt that this is causing the symptoms that she is describing.

## 2020-01-07 MED ORDER — ROSUVASTATIN CALCIUM 20 MG PO TABS: 20.0000 mg | ORAL_TABLET | Freq: Every day | ORAL | 3 refills | Status: DC

## 2020-01-07 NOTE — Telephone Encounter (Signed)
Spoke with patient. Lipitor discontinued and Crestor 20mg  daily started.

## 2020-01-14 DIAGNOSIS — H401133 Primary open-angle glaucoma, bilateral, severe stage: Secondary | ICD-10-CM | POA: Diagnosis not present

## 2020-01-14 DIAGNOSIS — H02834 Dermatochalasis of left upper eyelid: Secondary | ICD-10-CM | POA: Diagnosis not present

## 2020-01-14 DIAGNOSIS — E119 Type 2 diabetes mellitus without complications: Secondary | ICD-10-CM | POA: Diagnosis not present

## 2020-01-14 DIAGNOSIS — H43812 Vitreous degeneration, left eye: Secondary | ICD-10-CM | POA: Diagnosis not present

## 2020-01-14 DIAGNOSIS — H2511 Age-related nuclear cataract, right eye: Secondary | ICD-10-CM | POA: Diagnosis not present

## 2020-01-14 DIAGNOSIS — Z961 Presence of intraocular lens: Secondary | ICD-10-CM | POA: Diagnosis not present

## 2020-01-14 DIAGNOSIS — H04123 Dry eye syndrome of bilateral lacrimal glands: Secondary | ICD-10-CM | POA: Diagnosis not present

## 2020-01-14 DIAGNOSIS — H26492 Other secondary cataract, left eye: Secondary | ICD-10-CM | POA: Diagnosis not present

## 2020-01-14 DIAGNOSIS — H02831 Dermatochalasis of right upper eyelid: Secondary | ICD-10-CM | POA: Diagnosis not present

## 2020-01-19 ENCOUNTER — Other Ambulatory Visit: Payer: Self-pay | Admitting: Internal Medicine

## 2020-01-21 NOTE — Telephone Encounter (Signed)
I spoke with patient and told her Atorvastatin was stopped and Rosuvastatin 20 mg daily was started. Patient confirms she has made this change.

## 2020-01-21 NOTE — Telephone Encounter (Signed)
Phone busy.  Will try again later to reach patient

## 2020-01-21 NOTE — Telephone Encounter (Signed)
Patient states she is calling to inquire about the name of the medication she had the reaction to. She states she is uncertain as to which medication she should be taking. Please advise.

## 2020-01-26 ENCOUNTER — Ambulatory Visit: Payer: Medicare HMO | Admitting: Internal Medicine

## 2020-01-28 NOTE — Progress Notes (Signed)
Cardiology Office Note   Date:  01/29/2020   ID:  [name], DOB Aug 04, 1933, MRN [id number]  PCP:  Tresa Garter, MD  Cardiologist:   Rollene Rotunda, MD   Chief Complaint  Patient presents with  . Palpitations      History of Present Illness: [name] is a 86 y.o. female of CAD with cardiac catheterization revealing two-vessel CAD with widely patent previously placed stent to the LAD with culprit lesion felt to be an ulcerated thrombotic irregular proximal to mid RCA lesion.  She underwent a successful but complex PCI/DES of the mid RCA x1.    She was placed on dual antiplatelet therapy with aspirin and Plavix for minimum of 1 year.  She had a follow-up echocardiogram which revealed an EF of 60% to 65% with normal LV function and grade 1 diastolic dysfunction with mildly dilated atria.  She was also placed on high-dose statin, atorvastatin 40 mg at at bedtime, continued on her home dose of carvedilol 25 mg twice daily.  She was noted to have some transient confusion during her hospitalization, but had returned to normal at discharge.  She is complaining of palpitations.  These seem to happen at night.  She had to take 1 sublingual nitroglycerin when her heart was racing.  She may have couple of these in the day but predominantly lying in a certain position at night.  She is not describing any resting symptoms such as chest pressure, neck or arm discomfort.  There is some positional chest pain.  She is not having symptoms similar to what she had at the time of her acute presentation.  She gets around slowly with a walker.  She has a lift to get her upstairs.   Past Medical History:  Diagnosis Date  . Anemia    iron deficiency  . Aneurysm, thoracic aortic (HCC)   . Anxiety   . B12 deficiency   . CAD (coronary artery disease)    s/p stenting of LAD 1999- cath 5-08 EF normal LAD 30-40% restenosis. D1 50% D2 80% LCX & RCA minimal plaque  . Chronic back pain   .  Constipation   . Depression   . Diabetes mellitus   . DVT (deep venous thrombosis) (HCC)   . GERD (gastroesophageal reflux disease)   . Gout   . HTN (hypertension)   . Hyperlipemia   . Obesity   . Osteoporosis   . Pancreatitis   . Polyarthritis    DJD/ possible PMR  . Renal insufficiency    Cr 1.2-1.3  . Seizures (HCC)   . Tinnitus   . Urinary frequency   . Vertigo   . Vitamin D deficiency     Past Surgical History:  Procedure Laterality Date  . ABDOMINAL HYSTERECTOMY    . CARDIAC CATHETERIZATION  2008   L main 20%, LAD stent patent, D1 50%, D2 80% (small), RCA 20%, EF 55-60%  . CHOLECYSTECTOMY    . CORONARY ANGIOPLASTY WITH STENT PLACEMENT  1999   LAD stent  . CORONARY STENT INTERVENTION N/A 10/16/2019   Procedure: CORONARY STENT INTERVENTION;  Surgeon: Marykay Lex, MD;  Location: [facility] INVAS[facility] CV LAB;  Service: Cardiovascular;  Laterality: N/A;  . HEMORRHOID SURGERY    . LAPAROSCOPIC LYSIS OF ADHESIONS N/A 03/24/2015   Procedure: LAPAROSCOPIC LYSIS OF ADHESIONS;  Surgeon: Luretha Murphy, MD;  Location: [facility];  Service: General;  Laterality: N/A;  . LAPAROSCOPY N/A 03/24/2015   Procedure: LAPAROSCOPY DIAGNOSTIC;  Surgeon: Johnathan Hausen, MD;  Location: [facility];  Service: General;  Laterality: N/A;  . LAPAROTOMY N/A 03/24/2015   Procedure: LAPAROTOMY with decompression of bowel;  Surgeon: Johnathan Hausen, MD;  Location: [facility];  Service: General;  Laterality: N/A;  . LEFT HEART CATH AND CORONARY ANGIOGRAPHY N/A 10/16/2019   Procedure: LEFT HEART CATH AND CORONARY ANGIOGRAPHY;  Surgeon: Leonie Man, MD;  Location: [facility] CV [facility];  Service: Cardiovascular;  Laterality: N/A;  . TUBAL LIGATION       Current Outpatient Medications  Medication Sig Dispense Refill  . acetaminophen (TYLENOL) 500 MG tablet Take 500 mg by mouth every 6 (six) hours as needed.     . Alcohol Swabs (B-D SINGLE USE SWABS REGULAR) PADS Use to clean area to check blood sugars twice a day 300 each  1  . aspirin 81 MG EC tablet Take 1 tablet (81 mg total) by mouth daily. 90 tablet 1  . Blood Glucose Calibration (ACCU-CHEK AVIVA) SOLN Use as directed 3 each 1  . carvedilol (COREG) 25 MG tablet TAKE 1 TABLET BY MOUTH TWICE DAILY WITH MEALS 14 tablet 0  . Cholecalciferol (VITAMIN D3) 50 MCG (2000 UT) capsule Take 1 capsule (2,000 Units total) by mouth daily. 100 capsule 3  . clopidogrel (PLAVIX) 75 MG tablet Take 1 tablet (75 mg total) by mouth daily. 90 tablet 3  . Coenzyme Q10 (COQ10) 200 MG CAPS One daily 100 capsule 3  . cyclobenzaprine (FLEXERIL) 5 MG tablet Take 1 tablet (5 mg total) by mouth at bedtime as needed for muscle spasms. 30 tablet 3  . DULoxetine (CYMBALTA) 20 MG capsule Take 1 capsule (20 mg total) by mouth daily. 90 capsule 0  . furosemide (LASIX) 20 MG tablet Take 1 tablet (20 mg total) by mouth daily as needed. Leg swelling 90 tablet 3  . glucose blood (ONETOUCH VERIO) test strip Use as instructed 300 each 3  . levETIRAcetam (KEPPRA) 500 MG tablet TAKE 1 TABLET TWICE DAILY 180 tablet 1  . nitroGLYCERIN (NITROSTAT) 0.4 MG SL tablet Place 1 tablet (0.4 mg total) under the tongue every 5 (five) minutes x 3 doses as needed for chest pain. 25 tablet 3  . ondansetron (ZOFRAN) 4 MG tablet TAKE 1 TABLET BY MOUTH TWICE DAILY AS NEEDED FOR NAUSEA FOR VOMITING 30 tablet 0  . ONETOUCH DELICA LANCETS FINE MISC 1 Device by Does not apply route daily as needed. 100 each 3  . predniSONE (DELTASONE) 5 MG tablet TAKE 2 TABLETS (10 MG TOTAL) BY MOUTH DAILY WITH BREAKFAST. 180 tablet 1  . rosuvastatin (CRESTOR) 20 MG tablet Take 1 tablet (20 mg total) by mouth daily. 90 tablet 3  . vitamin B-12 (CYANOCOBALAMIN) 1000 MCG tablet Take 1,000 mcg by mouth daily.     No current facility-administered medications for this visit.    Allergies:   Ativan [lorazepam], Tramadol hcl, Amlodipine besylate, Atenolol, Benazepril, Benicar [olmesartan medoxomil], Cozaar, Hydralazine, Hydrochlorothiazide  w-triamterene, Hydrocodone, Hydroxyzine pamoate, Iodine, Lisinopril, Peach flavor, Penicillins, Pravastatin, Prednisolone, Strawberry flavor, Tramadol hcl, and Benadryl [diphenhydramine]    ROS:  Please see the history of present illness.   Otherwise, review of systems are positive for constipation, nausea.   All other systems are reviewed and negative.    PHYSICAL EXAM: VS:  BP (!) 144/76   Pulse 72   Temp 98.4 F (36.9 C) (Temporal)   Resp 15   Ht 5\' 6"  (1.676 m)   Wt 178 lb 6.4 oz (80.9 kg)  SpO2 99%   BMI 28.79 kg/m  , BMI Body mass index is 28.79 kg/m. GEN:  No distress NECK:  No jugular venous distention at 90 degrees, waveform within normal limits, carotid upstroke brisk and symmetric, no bruits, no thyromegaly LYMPHATICS:  No cervical adenopathy LUNGS:  Clear to auscultation bilaterally BACK:  No CVA tenderness CHEST:  Unremarkable HEART:  S1 and S2 within normal limits, no S3, no S4, no clicks, no rubs, no murmurs ABD:  Positive bowel sounds normal in frequency in pitch, no bruits, no rebound, no guarding, unable to assess midline mass or bruit with the patient seated. EXT:  2 plus pulses throughout, moderate edema, no cyanosis no clubbing SKIN:  No rashes no nodules NEURO:  Cranial nerves II through XII grossly intact, motor grossly intact throughout PSYCH:  Cognitively intact, oriented to person place and time   EKG:  EKG is ordered today. The ekg ordered today demonstrates sinus rhythm, rate 72, right bundle, no acute ST-T wave changes.   Recent Labs: 10/16/2019: BUN 20; Creatinine, Ser 1.14; Hemoglobin 10.3; Platelets 162; Potassium 4.5; Sodium 137    Lipid Panel    Component Value Date/Time   CHOL 169 06/25/2015 0410   TRIG 61 06/25/2015 0410   HDL 57 06/25/2015 0410   CHOLHDL 3.0 06/25/2015 0410   VLDL 12 06/25/2015 0410   LDLCALC 100 (H) 06/25/2015 0410   LDLDIRECT 114.8 01/10/2011 0923      Wt Readings from Last 3 Encounters:  01/29/20 178 lb 6.4  oz (80.9 kg)  11/02/19 169 lb 3.2 oz (76.7 kg)  10/27/19 174 lb (78.9 kg)      Other studies Reviewed: Additional studies/ records that were reviewed today include: None. Review of the above records demonstrates:  Please see elsewhere in the note.     ASSESSMENT AND PLAN:  CAD:   She has no symptoms consistent with a previous angina.  No change in therapy.  She did not want a participating cardiac rehab.   Hyperlipidemia:   She should have her lipids checked in about 2 months.  We will do this when she comes back.   Hypertension:   The blood pressure is controlled.  No change in therapy.  Palpitations: I am going to apply a 2-week monitor.  Further management will be based on these results.  Covid education:   She has had the Covid vaccines.   Current medicines are reviewed at length with the patient today.  The patient does not have concerns regarding medicines.  The following changes have been made:  no change  Labs/ tests ordered today include:   Orders Placed This Encounter  Procedures  . LONG TERM MONITOR (3-14 DAYS)  . EKG 12-Lead     Disposition:   FU with APP or me after the monitor.     Signed, Rollene Rotunda, MD  01/29/2020 5:50 PM    [facility]

## 2020-01-29 ENCOUNTER — Ambulatory Visit (INDEPENDENT_AMBULATORY_CARE_PROVIDER_SITE_OTHER): Payer: Medicare HMO | Admitting: Cardiology

## 2020-01-29 ENCOUNTER — Telehealth: Payer: Self-pay | Admitting: Radiology

## 2020-01-29 ENCOUNTER — Other Ambulatory Visit: Payer: Self-pay

## 2020-01-29 ENCOUNTER — Encounter: Payer: Self-pay | Admitting: Cardiology

## 2020-01-29 VITALS — BP 144/76 | HR 72 | Temp 98.4°F | Resp 15 | Ht 66.0 in | Wt 178.4 lb

## 2020-01-29 DIAGNOSIS — I2510 Atherosclerotic heart disease of native coronary artery without angina pectoris: Secondary | ICD-10-CM | POA: Diagnosis not present

## 2020-01-29 DIAGNOSIS — R002 Palpitations: Secondary | ICD-10-CM | POA: Diagnosis not present

## 2020-01-29 DIAGNOSIS — E785 Hyperlipidemia, unspecified: Secondary | ICD-10-CM | POA: Diagnosis not present

## 2020-01-29 DIAGNOSIS — I10 Essential (primary) hypertension: Secondary | ICD-10-CM | POA: Diagnosis not present

## 2020-01-29 DIAGNOSIS — Z7189 Other specified counseling: Secondary | ICD-10-CM | POA: Diagnosis not present

## 2020-01-29 NOTE — Telephone Encounter (Signed)
Enrolled patient for a 14 day Zio monitor to be mailed to patients home.  

## 2020-01-29 NOTE — Patient Instructions (Signed)
Medication Instructions:  No changes *If you need a refill on your cardiac medications before your next appointment, please call your pharmacy*  Lab Work: None needed this visit  Testing/Procedures: Your physician has recommended that you wear an event monitor. Event monitors are medical devices that record the heart's electrical activity. Doctors most often [place] these monitors to diagnose arrhythmias. Arrhythmias are problems with the speed or rhythm of the heartbeat. The monitor is a small, portable device. You can wear one while you do your normal daily activities. This is usually used to diagnose what is causing palpitations/syncope (passing out).   Follow-Up: At [facility], you and your health needs are our priority.  As part of our continuing mission to provide you with exceptional heart care, we have created designated Provider Care Teams.  These Care Teams include your primary Cardiologist (physician) and Advanced Practice Providers (APPs -  Physician Assistants and Nurse Practitioners) who all work together to provide you with the care you need, when you need it.  Your next appointment:   1 month(s)  The format for your next appointment:   In Person  Provider:   You may see one of the following Advanced Practice Providers on your designated Care Team:    Theodore Demark, PA-C  Joni Reining, DNP, ANP  Corine Shelter, [place]  Edd Fabian, NP    Other Instructions Christena Deem- Long Term Monitor Instructions   Your physician has requested you wear your ZIO patch monitor 14 days.   This is a single patch monitor.  [employer] supplies one patch monitor per enrollment.  Additional stickers are not available.   Please do not apply patch if you will be having a Nuclear Stress Test, Echocardiogram, Cardiac CT, MRI, or Chest Xray during the time frame you would be wearing the monitor. The patch cannot be worn during these tests.  You cannot remove and re-apply the ZIO XT patch  monitor.   Your ZIO patch monitor will be sent USPS Priority mail from [facility] directly to your home address. The monitor may also be mailed to a PO BOX if home delivery is not available.   It may take 3-5 days to receive your monitor after you have been enrolled.   Once you have received you monitor, please review enclosed instructions.  Your monitor has already been registered assigning a specific monitor serial # to you.   Applying the monitor   Shave hair from upper left chest.   Hold abrader disc by orange tab.  Rub abrader in 40 strokes over left upper chest as indicated in your monitor instructions.   Clean area with 4 enclosed alcohol pads .  Use all pads to assure are is cleaned thoroughly.  Let dry.   Apply patch as indicated in monitor instructions.  Patch will be place under collarbone on left side of chest with arrow pointing upward.   Rub patch adhesive wings for 2 minutes.Remove white label marked "1".  Remove white label marked "2".  Rub patch adhesive wings for 2 additional minutes.   While looking in a mirror, press and release button in center of patch.  A small green light will flash 3-4 times .  This will be your only indicator the monitor has been turned on.     Do not shower for the first 24 hours.  You may shower after the first 24 hours.   Press button if you feel a symptom. You will hear a small click.  Record Date,  Time and Symptom in the Patient Log Book.   When you are ready to remove patch, follow instructions on last 2 pages of Patient Log Book.  Stick patch monitor onto last page of Patient Log Book.   Place Patient Log Book in [place] box.  Use locking tab on box and tape box closed securely.  The [employer] has [employer] on it.  Please place in mailbox as soon as possible.  Your physician should have your test results approximately 7 days after the monitor has been mailed back to [facility].   Call Reader  at [phone] if you have questions regarding your ZIO XT patch monitor.  Call them immediately if you see an orange light blinking on your monitor.   If your monitor falls off in less than 4 days contact our Monitor department at [phone].  If your monitor becomes loose or falls off after 4 days call [employer] at [phone] for suggestions on securing your monitor.

## 2020-02-02 ENCOUNTER — Encounter: Payer: Self-pay | Admitting: Internal Medicine

## 2020-02-02 ENCOUNTER — Ambulatory Visit (INDEPENDENT_AMBULATORY_CARE_PROVIDER_SITE_OTHER): Payer: Medicare HMO | Admitting: Internal Medicine

## 2020-02-02 DIAGNOSIS — Z8673 Personal history of transient ischemic attack (TIA), and cerebral infarction without residual deficits: Secondary | ICD-10-CM | POA: Diagnosis not present

## 2020-02-02 DIAGNOSIS — N390 Urinary tract infection, site not specified: Secondary | ICD-10-CM

## 2020-02-02 DIAGNOSIS — R5383 Other fatigue: Secondary | ICD-10-CM | POA: Diagnosis not present

## 2020-02-02 DIAGNOSIS — I6389 Other cerebral infarction: Secondary | ICD-10-CM | POA: Diagnosis not present

## 2020-02-02 MED ORDER — ONDANSETRON HCL 4 MG PO TABS: ORAL_TABLET | ORAL | 0 refills | Status: DC

## 2020-02-02 MED ORDER — METHYLPREDNISOLONE 4 MG PO TBPK: ORAL_TABLET | ORAL | 0 refills | Status: DC

## 2020-02-02 MED ORDER — NITROFURANTOIN MONOHYD MACRO 100 MG PO CAPS: 100.0000 mg | ORAL_CAPSULE | Freq: Two times a day (BID) | ORAL | 0 refills | Status: DC

## 2020-02-02 MED ORDER — CIPROFLOXACIN HCL 250 MG PO TABS: 250.0000 mg | ORAL_TABLET | Freq: Two times a day (BID) | ORAL | 0 refills | Status: DC

## 2020-02-02 NOTE — Assessment & Plan Note (Signed)
No relapse.  On aspirin and Plavix

## 2020-02-02 NOTE — Progress Notes (Signed)
Virtual Visit via Video Note  I connected with SHAUNA BODKINS on 02/02/20 at  3:00 PM EDT by a video enabled telemedicine application and verified that I am speaking with the correct person using two identifiers.   I discussed the limitations of evaluation and management by telemedicine and the availability of in person appointments. The patient expressed understanding and agreed to proceed.  History of Present Illness: We are connected with the patient and her daughter.  The patient had a Covid vaccine (second shot) last Tuesday she has not felt well since then.  She has been nauseated, weak, her urine smells strong, she hurts everywhere.  She took Zofran for nausea.  There has been no runny nose, cough, chest pain, shortness of breath, abdominal pain, diarrhea, skin rashes.   Observations/Objective: The patient appears to be in no acute distress, looks chronically ill  Assessment and Plan:  See my Assessment and Plan. Follow Up Instructions:    I discussed the assessment and treatment plan with the patient. The patient was provided an opportunity to ask questions and all were answered. The patient agreed with the plan and demonstrated an understanding of the instructions.   The patient was advised to call back or seek an in-person evaluation if the symptoms worsen or if the condition fails to improve as anticipated.  I provided face-to-face time during this encounter. We were at different locations.   [name], MD

## 2020-02-02 NOTE — Assessment & Plan Note (Signed)
Medrol Dosepak should help Take [name] to ER if she is worse

## 2020-02-02 NOTE — Assessment & Plan Note (Signed)
Probable.  Empiric Cipro for 7 days

## 2020-02-04 ENCOUNTER — Telehealth: Payer: Self-pay | Admitting: Internal Medicine

## 2020-02-04 NOTE — Telephone Encounter (Signed)
New message:   [name] daughter and calling to see if we can call back to discuss some medication. She states she got very weak after taking some of the medication. She also states she has a dry cough that is continuous.The family is very concerned about all of the medication the patient is currently on. Please advise.

## 2020-02-04 NOTE — Telephone Encounter (Signed)
    Patient calling with complaint of cough. She states recently added medications may be the cause. She did not know the names of the medications that might be causing the issue.   Please call

## 2020-02-05 ENCOUNTER — Encounter (HOSPITAL_COMMUNITY): Payer: Self-pay

## 2020-02-05 ENCOUNTER — Inpatient Hospital Stay: Payer: Self-pay

## 2020-02-05 ENCOUNTER — Other Ambulatory Visit: Payer: Self-pay

## 2020-02-05 ENCOUNTER — Emergency Department (HOSPITAL_COMMUNITY): Payer: Medicare HMO

## 2020-02-05 ENCOUNTER — Inpatient Hospital Stay (HOSPITAL_COMMUNITY)
Admission: EM | Admit: 2020-02-05 | Discharge: 2020-02-11 | DRG: 177 | Disposition: A | Discharge status: Skilled Nursing Facility | Payer: Medicare HMO | Attending: Internal Medicine | Admitting: Internal Medicine

## 2020-02-05 ENCOUNTER — Ambulatory Visit: Payer: Medicare HMO | Admitting: Podiatry

## 2020-02-05 DIAGNOSIS — E860 Dehydration: Secondary | ICD-10-CM | POA: Diagnosis present

## 2020-02-05 DIAGNOSIS — E538 Deficiency of other specified B group vitamins: Secondary | ICD-10-CM | POA: Diagnosis present

## 2020-02-05 DIAGNOSIS — N179 Acute kidney failure, unspecified: Secondary | ICD-10-CM

## 2020-02-05 DIAGNOSIS — I10 Essential (primary) hypertension: Secondary | ICD-10-CM | POA: Diagnosis not present

## 2020-02-05 DIAGNOSIS — I4891 Unspecified atrial fibrillation: Secondary | ICD-10-CM | POA: Diagnosis not present

## 2020-02-05 DIAGNOSIS — R0602 Shortness of breath: Secondary | ICD-10-CM | POA: Diagnosis not present

## 2020-02-05 DIAGNOSIS — M6281 Muscle weakness (generalized): Secondary | ICD-10-CM | POA: Diagnosis not present

## 2020-02-05 DIAGNOSIS — Z79899 Other long term (current) drug therapy: Secondary | ICD-10-CM

## 2020-02-05 DIAGNOSIS — Z885 Allergy status to narcotic agent status: Secondary | ICD-10-CM

## 2020-02-05 DIAGNOSIS — Z8249 Family history of ischemic heart disease and other diseases of the circulatory system: Secondary | ICD-10-CM

## 2020-02-05 DIAGNOSIS — M130 Polyarthritis, unspecified: Secondary | ICD-10-CM | POA: Diagnosis present

## 2020-02-05 DIAGNOSIS — E872 Acidosis: Secondary | ICD-10-CM | POA: Diagnosis present

## 2020-02-05 DIAGNOSIS — M810 Age-related osteoporosis without current pathological fracture: Secondary | ICD-10-CM | POA: Diagnosis present

## 2020-02-05 DIAGNOSIS — R41841 Cognitive communication deficit: Secondary | ICD-10-CM | POA: Diagnosis not present

## 2020-02-05 DIAGNOSIS — E785 Hyperlipidemia, unspecified: Secondary | ICD-10-CM | POA: Diagnosis not present

## 2020-02-05 DIAGNOSIS — E119 Type 2 diabetes mellitus without complications: Secondary | ICD-10-CM | POA: Diagnosis present

## 2020-02-05 DIAGNOSIS — Z743 Need for continuous supervision: Secondary | ICD-10-CM | POA: Diagnosis not present

## 2020-02-05 DIAGNOSIS — R509 Fever, unspecified: Secondary | ICD-10-CM

## 2020-02-05 DIAGNOSIS — R05 Cough: Secondary | ICD-10-CM

## 2020-02-05 DIAGNOSIS — Z7902 Long term (current) use of antithrombotics/antiplatelets: Secondary | ICD-10-CM

## 2020-02-05 DIAGNOSIS — I499 Cardiac arrhythmia, unspecified: Secondary | ICD-10-CM | POA: Diagnosis not present

## 2020-02-05 DIAGNOSIS — R112 Nausea with vomiting, unspecified: Secondary | ICD-10-CM | POA: Diagnosis not present

## 2020-02-05 DIAGNOSIS — I712 Thoracic aortic aneurysm, without rupture: Secondary | ICD-10-CM | POA: Diagnosis present

## 2020-02-05 DIAGNOSIS — R2681 Unsteadiness on feet: Secondary | ICD-10-CM | POA: Diagnosis not present

## 2020-02-05 DIAGNOSIS — J1282 Pneumonia due to coronavirus disease 2019: Secondary | ICD-10-CM | POA: Diagnosis present

## 2020-02-05 DIAGNOSIS — M6259 Muscle wasting and atrophy, not elsewhere classified, multiple sites: Secondary | ICD-10-CM | POA: Diagnosis not present

## 2020-02-05 DIAGNOSIS — Z955 Presence of coronary angioplasty implant and graft: Secondary | ICD-10-CM | POA: Diagnosis not present

## 2020-02-05 DIAGNOSIS — Z90710 Acquired absence of both cervix and uterus: Secondary | ICD-10-CM

## 2020-02-05 DIAGNOSIS — F329 Major depressive disorder, single episode, unspecified: Secondary | ICD-10-CM | POA: Diagnosis present

## 2020-02-05 DIAGNOSIS — N390 Urinary tract infection, site not specified: Secondary | ICD-10-CM | POA: Diagnosis present

## 2020-02-05 DIAGNOSIS — Z888 Allergy status to other drugs, medicaments and biological substances status: Secondary | ICD-10-CM | POA: Diagnosis not present

## 2020-02-05 DIAGNOSIS — R197 Diarrhea, unspecified: Secondary | ICD-10-CM | POA: Diagnosis present

## 2020-02-05 DIAGNOSIS — R279 Unspecified lack of coordination: Secondary | ICD-10-CM | POA: Diagnosis not present

## 2020-02-05 DIAGNOSIS — R1311 Dysphagia, oral phase: Secondary | ICD-10-CM | POA: Diagnosis not present

## 2020-02-05 DIAGNOSIS — I2510 Atherosclerotic heart disease of native coronary artery without angina pectoris: Secondary | ICD-10-CM | POA: Diagnosis present

## 2020-02-05 DIAGNOSIS — Z7982 Long term (current) use of aspirin: Secondary | ICD-10-CM

## 2020-02-05 DIAGNOSIS — R531 Weakness: Secondary | ICD-10-CM

## 2020-02-05 DIAGNOSIS — Z86718 Personal history of other venous thrombosis and embolism: Secondary | ICD-10-CM

## 2020-02-05 DIAGNOSIS — Z833 Family history of diabetes mellitus: Secondary | ICD-10-CM

## 2020-02-05 DIAGNOSIS — Z880 Allergy status to penicillin: Secondary | ICD-10-CM

## 2020-02-05 DIAGNOSIS — E559 Vitamin D deficiency, unspecified: Secondary | ICD-10-CM | POA: Diagnosis present

## 2020-02-05 DIAGNOSIS — R569 Unspecified convulsions: Secondary | ICD-10-CM | POA: Diagnosis not present

## 2020-02-05 DIAGNOSIS — M6389 Disorders of muscle in diseases classified elsewhere, multiple sites: Secondary | ICD-10-CM | POA: Diagnosis not present

## 2020-02-05 DIAGNOSIS — K219 Gastro-esophageal reflux disease without esophagitis: Secondary | ICD-10-CM | POA: Diagnosis present

## 2020-02-05 DIAGNOSIS — R0902 Hypoxemia: Secondary | ICD-10-CM | POA: Diagnosis not present

## 2020-02-05 DIAGNOSIS — U071 COVID-19: Principal | ICD-10-CM | POA: Diagnosis present

## 2020-02-05 DIAGNOSIS — R059 Cough, unspecified: Secondary | ICD-10-CM

## 2020-02-05 DIAGNOSIS — R52 Pain, unspecified: Secondary | ICD-10-CM | POA: Diagnosis not present

## 2020-02-05 DIAGNOSIS — M25562 Pain in left knee: Secondary | ICD-10-CM | POA: Diagnosis not present

## 2020-02-05 DIAGNOSIS — R000 Tachycardia, unspecified: Secondary | ICD-10-CM | POA: Diagnosis not present

## 2020-02-05 LAB — URINALYSIS, ROUTINE W REFLEX MICROSCOPIC
Bilirubin Urine: NEGATIVE
Glucose, UA: NEGATIVE mg/dL
Hgb urine dipstick: NEGATIVE
Ketones, ur: NEGATIVE mg/dL
Leukocytes,Ua: NEGATIVE
Nitrite: NEGATIVE
Protein, ur: 100 mg/dL — AB
Specific Gravity, Urine: 1.016 (ref 1.005–1.030)
pH: 5 (ref 5.0–8.0)

## 2020-02-05 LAB — CBC WITH DIFFERENTIAL/PLATELET
Abs Immature Granulocytes: 0.06 10*3/uL (ref 0.00–0.07)
Basophils Absolute: 0 10*3/uL (ref 0.0–0.1)
Basophils Relative: 0 %
Eosinophils Absolute: 0.1 10*3/uL (ref 0.0–0.5)
Eosinophils Relative: 1 %
HCT: 33.5 % — ABNORMAL LOW (ref 36.0–46.0)
Hemoglobin: 10.2 g/dL — ABNORMAL LOW (ref 12.0–15.0)
Immature Granulocytes: 1 %
Lymphocytes Relative: 8 %
Lymphs Abs: 0.8 10*3/uL (ref 0.7–4.0)
MCH: 29.4 pg (ref 26.0–34.0)
MCHC: 30.4 g/dL (ref 30.0–36.0)
MCV: 96.5 fL (ref 80.0–100.0)
Monocytes Absolute: 0.5 10*3/uL (ref 0.1–1.0)
Monocytes Relative: 5 %
Neutro Abs: 8.5 10*3/uL — ABNORMAL HIGH (ref 1.7–7.7)
Neutrophils Relative %: 85 %
Platelets: 231 10*3/uL (ref 150–400)
RBC: 3.47 MIL/uL — ABNORMAL LOW (ref 3.87–5.11)
RDW: 13 % (ref 11.5–15.5)
WBC: 9.9 10*3/uL (ref 4.0–10.5)
nRBC: 0 % (ref 0.0–0.2)

## 2020-02-05 LAB — COMPREHENSIVE METABOLIC PANEL
ALT: 31 U/L (ref 0–44)
AST: 80 U/L — ABNORMAL HIGH (ref 15–41)
Albumin: 3.1 g/dL — ABNORMAL LOW (ref 3.5–5.0)
Alkaline Phosphatase: 61 U/L (ref 38–126)
Anion gap: 17 — ABNORMAL HIGH (ref 5–15)
BUN: 43 mg/dL — ABNORMAL HIGH (ref 8–23)
CO2: 19 mmol/L — ABNORMAL LOW (ref 22–32)
Calcium: 9.1 mg/dL (ref 8.9–10.3)
Chloride: 105 mmol/L (ref 98–111)
Creatinine, Ser: 1.98 mg/dL — ABNORMAL HIGH (ref 0.44–1.00)
GFR calc Af Amer: 26 mL/min — ABNORMAL LOW (ref >60)
GFR calc non Af Amer: 23 mL/min — ABNORMAL LOW (ref >60)
Glucose, Bld: 98 mg/dL (ref 70–99)
Potassium: 3.7 mmol/L (ref 3.5–5.1)
Sodium: 141 mmol/L (ref 135–145)
Total Bilirubin: 1.3 mg/dL — ABNORMAL HIGH (ref 0.3–1.2)
Total Protein: 7.8 g/dL (ref 6.5–8.1)

## 2020-02-05 LAB — GLUCOSE, CAPILLARY
Glucose-Capillary: 100 mg/dL — ABNORMAL HIGH (ref 70–99)
Glucose-Capillary: 157 mg/dL — ABNORMAL HIGH (ref 70–99)

## 2020-02-05 LAB — CBG MONITORING, ED: Glucose-Capillary: 99 mg/dL (ref 70–99)

## 2020-02-05 LAB — TROPONIN I (HIGH SENSITIVITY)
Troponin I (High Sensitivity): 60 ng/L — ABNORMAL HIGH (ref <18)
Troponin I (High Sensitivity): 60 ng/L — ABNORMAL HIGH (ref <18)

## 2020-02-05 LAB — LIPASE, BLOOD: Lipase: 36 U/L (ref 11–51)

## 2020-02-05 LAB — LACTIC ACID, PLASMA
Lactic Acid, Venous: 1.9 mmol/L (ref 0.5–1.9)
Lactic Acid, Venous: 0.9 mmol/L (ref 0.5–1.9)

## 2020-02-05 LAB — POC SARS CORONAVIRUS 2 AG -  ED: SARS Coronavirus 2 Ag: POSITIVE — AB

## 2020-02-05 MED ORDER — ONDANSETRON HCL 4 MG/2ML IJ SOLN: 4.0000 mg | Freq: Four times a day (QID) | INTRAMUSCULAR | Status: DC | PRN

## 2020-02-05 MED ORDER — DULOXETINE HCL 20 MG PO CPEP
20.0000 mg | ORAL_CAPSULE | Freq: Every day | ORAL | Status: DC
  Administered 2020-02-05 – 2020-02-11 (×7): 20 mg via ORAL
  Filled 2020-02-05 (×7): qty 1

## 2020-02-05 MED ORDER — SODIUM CHLORIDE 0.9 % IV SOLN
200.0000 mg | Freq: Once | INTRAVENOUS | Status: AC
  Administered 2020-02-06: 200 mg via INTRAVENOUS
  Filled 2020-02-05: qty 40

## 2020-02-05 MED ORDER — LEVETIRACETAM 500 MG PO TABS
500.0000 mg | ORAL_TABLET | Freq: Two times a day (BID) | ORAL | Status: DC
  Administered 2020-02-05 – 2020-02-11 (×12): 500 mg via ORAL
  Filled 2020-02-05 (×12): qty 1

## 2020-02-05 MED ORDER — BENZONATATE 100 MG PO CAPS
100.0000 mg | ORAL_CAPSULE | Freq: Once | ORAL | Status: AC
  Administered 2020-02-05: 100 mg via ORAL
  Filled 2020-02-05: qty 1

## 2020-02-05 MED ORDER — CYCLOBENZAPRINE HCL 5 MG PO TABS: 5.0000 mg | ORAL_TABLET | Freq: Every evening | ORAL | Status: DC | PRN

## 2020-02-05 MED ORDER — VITAMIN B-12 1000 MCG PO TABS
1000.0000 ug | ORAL_TABLET | Freq: Every day | ORAL | Status: DC
  Administered 2020-02-05 – 2020-02-11 (×7): 1000 ug via ORAL
  Filled 2020-02-05 (×7): qty 1

## 2020-02-05 MED ORDER — CLOPIDOGREL BISULFATE 75 MG PO TABS
75.0000 mg | ORAL_TABLET | Freq: Every day | ORAL | Status: DC
  Administered 2020-02-06 – 2020-02-11 (×6): 75 mg via ORAL
  Filled 2020-02-05 (×7): qty 1

## 2020-02-05 MED ORDER — GUAIFENESIN-DM 100-10 MG/5ML PO SYRP
10.0000 mL | ORAL_SOLUTION | ORAL | Status: DC | PRN
  Administered 2020-02-05: 10 mL via ORAL
  Filled 2020-02-05: qty 10

## 2020-02-05 MED ORDER — CEFTRIAXONE SODIUM 1 G IJ SOLR
1.0000 g | Freq: Once | INTRAMUSCULAR | Status: AC
  Administered 2020-02-05: 1 g via INTRAMUSCULAR
  Filled 2020-02-05 (×2): qty 10

## 2020-02-05 MED ORDER — ACETAMINOPHEN 325 MG PO TABS
650.0000 mg | ORAL_TABLET | Freq: Four times a day (QID) | ORAL | Status: DC | PRN
  Filled 2020-02-05: qty 2

## 2020-02-05 MED ORDER — SODIUM CHLORIDE 0.9 % IV BOLUS
500.0000 mL | Freq: Once | INTRAVENOUS | Status: AC
  Administered 2020-02-05: 500 mL via INTRAVENOUS

## 2020-02-05 MED ORDER — VITAMIN D 25 MCG (1000 UNIT) PO TABS
2000.0000 [IU] | ORAL_TABLET | Freq: Every day | ORAL | Status: DC
  Administered 2020-02-05 – 2020-02-11 (×7): 2000 [IU] via ORAL
  Filled 2020-02-05 (×7): qty 2

## 2020-02-05 MED ORDER — DEXAMETHASONE 6 MG PO TABS
6.0000 mg | ORAL_TABLET | ORAL | Status: DC
  Administered 2020-02-05: 6 mg via ORAL
  Filled 2020-02-05: qty 1

## 2020-02-05 MED ORDER — HEPARIN SODIUM (PORCINE) 5000 UNIT/ML IJ SOLN
5000.0000 [IU] | Freq: Two times a day (BID) | INTRAMUSCULAR | Status: DC
  Administered 2020-02-05 – 2020-02-06 (×3): 5000 [IU] via SUBCUTANEOUS
  Filled 2020-02-05 (×3): qty 1

## 2020-02-05 MED ORDER — INSULIN ASPART 100 UNIT/ML ~~LOC~~ SOLN
0.0000 [IU] | Freq: Three times a day (TID) | SUBCUTANEOUS | Status: DC
  Administered 2020-02-06 – 2020-02-07 (×4): 2 [IU] via SUBCUTANEOUS
  Administered 2020-02-07: 3 [IU] via SUBCUTANEOUS
  Administered 2020-02-07: 2 [IU] via SUBCUTANEOUS
  Administered 2020-02-08: 7 [IU] via SUBCUTANEOUS
  Administered 2020-02-08 – 2020-02-09 (×3): 5 [IU] via SUBCUTANEOUS
  Administered 2020-02-09 – 2020-02-10 (×3): 3 [IU] via SUBCUTANEOUS
  Administered 2020-02-10: 2 [IU] via SUBCUTANEOUS
  Administered 2020-02-10: 5 [IU] via SUBCUTANEOUS
  Administered 2020-02-11: 2 [IU] via SUBCUTANEOUS

## 2020-02-05 MED ORDER — ROSUVASTATIN CALCIUM 20 MG PO TABS
20.0000 mg | ORAL_TABLET | Freq: Every day | ORAL | Status: DC
  Administered 2020-02-05 – 2020-02-10 (×6): 20 mg via ORAL
  Filled 2020-02-05 (×6): qty 1

## 2020-02-05 MED ORDER — COQ10 200 MG PO CAPS: 200.0000 mg | ORAL_CAPSULE | Freq: Every day | ORAL | Status: DC

## 2020-02-05 MED ORDER — ALBUTEROL SULFATE HFA 108 (90 BASE) MCG/ACT IN AERS
2.0000 | INHALATION_SPRAY | RESPIRATORY_TRACT | Status: DC
  Administered 2020-02-05 – 2020-02-06 (×3): 2 via RESPIRATORY_TRACT
  Filled 2020-02-05 (×2): qty 6.7

## 2020-02-05 MED ORDER — SODIUM CHLORIDE 0.9 % IV SOLN
1.0000 g | INTRAVENOUS | Status: AC
  Administered 2020-02-06 – 2020-02-07 (×2): 1 g via INTRAVENOUS
  Filled 2020-02-05 (×2): qty 10

## 2020-02-05 MED ORDER — ONDANSETRON HCL 4 MG PO TABS: 4.0000 mg | ORAL_TABLET | Freq: Four times a day (QID) | ORAL | Status: DC | PRN

## 2020-02-05 MED ORDER — ASPIRIN EC 81 MG PO TBEC
81.0000 mg | DELAYED_RELEASE_TABLET | Freq: Every day | ORAL | Status: DC
  Administered 2020-02-05 – 2020-02-11 (×7): 81 mg via ORAL
  Filled 2020-02-05 (×8): qty 1

## 2020-02-05 MED ORDER — CARVEDILOL 25 MG PO TABS
25.0000 mg | ORAL_TABLET | Freq: Two times a day (BID) | ORAL | Status: DC
  Administered 2020-02-05 – 2020-02-11 (×12): 25 mg via ORAL
  Filled 2020-02-05 (×12): qty 1

## 2020-02-05 MED ORDER — BRIMONIDINE TARTRATE 0.2 % OP SOLN
1.0000 [drp] | Freq: Two times a day (BID) | OPHTHALMIC | Status: DC
  Administered 2020-02-05 – 2020-02-11 (×12): 1 [drp] via OPHTHALMIC
  Filled 2020-02-05: qty 5

## 2020-02-05 MED ORDER — SENNOSIDES-DOCUSATE SODIUM 8.6-50 MG PO TABS
1.0000 | ORAL_TABLET | Freq: Every evening | ORAL | Status: DC | PRN
  Administered 2020-02-09: 1 via ORAL
  Filled 2020-02-05: qty 1

## 2020-02-05 MED ORDER — SODIUM CHLORIDE 0.9 % IV SOLN: INTRAVENOUS | Status: AC

## 2020-02-05 MED ORDER — SODIUM CHLORIDE 0.9 % IV SOLN
100.0000 mg | Freq: Every day | INTRAVENOUS | Status: DC
  Filled 2020-02-05: qty 20

## 2020-02-05 NOTE — Telephone Encounter (Signed)
Pt at ED.

## 2020-02-05 NOTE — ED Triage Notes (Signed)
Pt comes from home via [facility] c/o fever, weakness and a dry cough for one week. Pt states she has been feeling like this since her second COVID vaccine on 3/16. Pt also reports an episode of diarrhea this morning.

## 2020-02-05 NOTE — ED Notes (Signed)
Date and time results received: 02/05/20 1154  Test: POC COVID Swab Critical Value: Positive  Name of Provider Notified: Dr. Rush Landmark

## 2020-02-05 NOTE — Plan of Care (Signed)

## 2020-02-05 NOTE — Progress Notes (Signed)
Consult for PICC requested by primary nurse, [facility].Spoke with the nurse, notified him that 2 IV nurses attempted to insert PIV this afternoon, both were unsuccessful, sites infiltrated, per ED nurse documentation.Recommended central line since no PICC nurse available tonight. Jose RN paged the MD.

## 2020-02-05 NOTE — Telephone Encounter (Signed)
Daughter notified me that pt is at ED

## 2020-02-05 NOTE — ED Notes (Signed)
IV team at bedside 

## 2020-02-05 NOTE — Progress Notes (Signed)
   Vital Signs MEWS/VS Documentation      02/05/2020 1701 02/05/2020 1900 02/05/2020 2000 02/05/2020 2101   MEWS Score:  0  0  0  0   MEWS Score Color:  Green  Green  Green  Green   Resp:  --  --  19  19   Pulse:  --  --  85  (!) 101   BP:  --  --  136/77  135/70   Temp:  --  --  98.9 F (37.2 C)  98.2 F (36.8 C)   O2 Device:  --  --  --  Room Air   Level of Consciousness:  Alert  --  --  Alert           [name] 02/05/2020,10:30 PM

## 2020-02-05 NOTE — H&P (Signed)
History and Physical    [name] UDJ:[id number] DOB: Nov 20, 1932 DOA: 02/05/2020  PCP: Tresa Garter, MD   Patient coming from: Home  I have personally briefly reviewed patient's old medical records in [facility]  Chief Complaint: Cough, feeling weak  HPI: [name] is a 86 y.o. female with medical history significant of CAD, HTN, HLD, Seizure presented with increasing fatigue, dry cough and increasing SOB for 8 days. Pt had her 2nd COVID shot on 03/16. She has been feeling fine until about 8 days ago, she started to have dry cough and congestion, no chest pain, subjective fever but no chills, and gradually she feels tired and sleepy, poor appetite, and loose diarrhea x2 yesterday. She also had "heaviness when urinating". Pt was seen by her PCP this week and was diagnosed with UTI and URI, was started on prednisone and cipro, so far no significant improvement of her symptoms. ED Course: COVID positive, X-ray B/L infiltrates, BMP showed Cre 1.98.  Review of Systems: As per HPI otherwise 10 point review of systems negative.    Past Medical History:  Diagnosis Date  . Anemia    iron deficiency  . Aneurysm, thoracic aortic (HCC)   . Anxiety   . B12 deficiency   . CAD (coronary artery disease)    s/p stenting of LAD 1999- cath 5-08 EF normal LAD 30-40% restenosis. D1 50% D2 80% LCX & RCA minimal plaque  . Chronic back pain   . Constipation   . Depression   . Diabetes mellitus   . DVT (deep venous thrombosis) (HCC)   . GERD (gastroesophageal reflux disease)   . Gout   . HTN (hypertension)   . Hyperlipemia   . Obesity   . Osteoporosis   . Pancreatitis   . Polyarthritis    DJD/ possible PMR  . Renal insufficiency    Cr 1.2-1.3  . Seizures (HCC)   . Tinnitus   . Urinary frequency   . Vertigo   . Vitamin D deficiency     Past Surgical History:  Procedure Laterality Date  . ABDOMINAL HYSTERECTOMY    . CARDIAC CATHETERIZATION  2008   L main 20%, LAD  stent patent, D1 50%, D2 80% (small), RCA 20%, EF 55-60%  . CHOLECYSTECTOMY    . CORONARY ANGIOPLASTY WITH STENT PLACEMENT  1999   LAD stent  . CORONARY STENT INTERVENTION N/A 10/16/2019   Procedure: CORONARY STENT INTERVENTION;  Surgeon: Marykay Lex, MD;  Location: [facility] INVASIVE CV LAB;  Service: Cardiovascular;  Laterality: N/A;  . HEMORRHOID SURGERY    . LAPAROSCOPIC LYSIS OF ADHESIONS N/A 03/24/2015   Procedure: LAPAROSCOPIC LYSIS OF ADHESIONS;  Surgeon: Luretha Murphy, MD;  Location: [facility] ORS;  Service: General;  Laterality: N/A;  . LAPAROSCOPY N/A 03/24/2015   Procedure: LAPAROSCOPY DIAGNOSTIC;  Surgeon: Luretha Murphy, MD;  Location: [facility] ORS;  Service: General;  Laterality: N/A;  . LAPAROTOMY N/A 03/24/2015   Procedure: LAPAROTOMY with decompression of bowel;  Surgeon: Luretha Murphy, MD;  Location: [facility] ORS;  Service: General;  Laterality: N/A;  . LEFT HEART CATH AND CORONARY ANGIOGRAPHY N/A 10/16/2019   Procedure: LEFT HEART CATH AND CORONARY ANGIOGRAPHY;  Surgeon: Marykay Lex, MD;  Location: [facility] INVASIVE CV LAB;  Service: Cardiovascular;  Laterality: N/A;  . TUBAL LIGATION       reports that she has never smoked. She has never used smokeless tobacco. She reports that she does not drink alcohol or use drugs.  Allergies  Allergen Reactions  . Ativan [Lorazepam]     Very confused  . Tramadol Hcl Anxiety and Rash    Headache  . Amlodipine Besylate Other (See Comments)     dizzy  . Atenolol Other (See Comments)    Fatigue  . Benazepril Cough  . Benicar [Olmesartan Medoxomil] Other (See Comments)    HEADACHE  . Cozaar     nausea  . Hydralazine     Hair loss  . Hydrochlorothiazide W-Triamterene Other (See Comments)     dizzy  . Hydrocodone Other (See Comments)    HEADACHE  . Hydroxyzine Pamoate Other (See Comments)    Per MAR  . Iodine Other (See Comments)    Per MAR  . Lisinopril Other (See Comments) and Cough    Tired & fatigue  . Peach Flavor Itching  . Penicillins  Itching    Has patient had a PCN reaction causing immediate rash, facial/tongue/throat swelling, SOB or lightheadedness with hypotension: No Has patient had a PCN reaction causing severe rash involving mucus membranes or skin necrosis: No Has patient had a PCN reaction that required hospitalization No Has patient had a PCN reaction occurring within the last 10 years: Yes If all of the above answers are "NO", then may proceed with Cephalosporin use.  tolerates cephalosporins OK   . Pravastatin Other (See Comments)    Myalgias-muscle pain  . Prednisolone Nausea Only and Other (See Comments)    Upset stomach  . Strawberry Flavor Itching  . Tramadol Hcl Other (See Comments)    headache  . Benadryl [Diphenhydramine] Itching and Palpitations    Family History  Adopted: Yes  Problem Relation Age of Onset  . Diabetes Mother   . Hypertension Father     Prior to Admission medications   Medication Sig Start Date End Date Taking? Authorizing Provider  acetaminophen (TYLENOL) 500 MG tablet Take 500 mg by mouth every 6 (six) hours as needed for mild pain or headache.     [provider]  Alcohol Swabs (B-D SINGLE USE SWABS REGULAR) PADS Use to clean area to check blood sugars twice a day 08/27/17   Plotnikov, Evie Lacks, MD  aspirin 81 MG EC tablet Take 1 tablet (81 mg total) by mouth daily. 10/18/19   Cheryln Manly, NP  atorvastatin (LIPITOR) 40 MG tablet Take 40 mg by mouth daily. 01/14/20   [provider]  Blood Glucose Calibration (ACCU-CHEK AVIVA) SOLN Use as directed Patient taking differently: 1 each by Other route See admin instructions. Use as directed per physician 02/20/17   Plotnikov, Evie Lacks, MD  brimonidine (ALPHAGAN) 0.2 % ophthalmic solution Place 1 drop into both eyes 2 (two) times daily. 01/14/20   [provider]  carvedilol (COREG) 25 MG tablet TAKE 1 TABLET BY MOUTH TWICE DAILY WITH MEALS Patient taking differently: Take 25 mg by mouth 2 (two)  times daily with a meal.  08/27/19   Plotnikov, Evie Lacks, MD  Cholecalciferol (VITAMIN D3) 50 MCG (2000 UT) capsule Take 1 capsule (2,000 Units total) by mouth daily. 10/27/19   Plotnikov, Evie Lacks, MD  ciprofloxacin (CIPRO) 250 MG tablet Take 1 tablet (250 mg total) by mouth 2 (two) times daily. 02/02/20   Plotnikov, Evie Lacks, MD  clopidogrel (PLAVIX) 75 MG tablet Take 1 tablet (75 mg total) by mouth daily. 12/18/19   Daune Perch, NP  Coenzyme Q10 (COQ10) 200 MG CAPS One daily Patient taking differently: Take 200 mg by mouth daily.  10/27/19   Plotnikov,  [name], MD  cyclobenzaprine (FLEXERIL) 5 MG tablet Take 1 tablet (5 mg total) by mouth at bedtime as needed for muscle spasms. 08/25/19   Plotnikov, [name], MD  DULoxetine (CYMBALTA) 20 MG capsule Take 1 capsule (20 mg total) by mouth daily. 06/23/19   Myrlene Broker, MD  furosemide (LASIX) 20 MG tablet Take 1 tablet (20 mg total) by mouth daily as needed. Leg swelling Patient taking differently: Take 20 mg by mouth daily as needed (Leg swelling).  08/18/19   Plotnikov, [name], MD  glucose blood (ONETOUCH VERIO) test strip Use as instructed Patient taking differently: 1 each by Other route See admin instructions. Use as instructed for blood sugar 08/27/17   Plotnikov, [name], MD  levETIRAcetam (KEPPRA) 500 MG tablet TAKE 1 TABLET TWICE DAILY Patient taking differently: Take 500 mg by mouth 2 (two) times daily.  09/04/19   Plotnikov, [name], MD  methylPREDNISolone (MEDROL DOSEPAK) 4 MG TBPK tablet As directed Patient taking differently: Take 4 mg by mouth See admin instructions. As directed per physician. 02/02/20   Plotnikov, [name], MD  nitrofurantoin, macrocrystal-monohydrate, (MACROBID) 100 MG capsule Take 100 mg by mouth 2 (two) times daily. 02/02/20   [provider]  nitroGLYCERIN (NITROSTAT) 0.4 MG SL tablet Place 1 tablet (0.4 mg total) under the tongue every 5 (five) minutes x 3 doses as needed for chest  pain. 12/18/19   Berton Bon, NP  ondansetron (ZOFRAN) 4 MG tablet TAKE 1 TABLET BY MOUTH TWICE DAILY AS NEEDED FOR NAUSEA FOR VOMITING Patient taking differently: Take 4 mg by mouth every 12 (twelve) hours as needed for nausea or vomiting.  02/02/20   Plotnikov, [name], MD  [facility] DELICA LANCETS FINE MISC 1 Device by Does not apply route daily as needed. Patient taking differently: 1 Device by Does not apply route daily as needed (blood glucose).  09/21/16   Plotnikov, [name], MD  predniSONE (DELTASONE) 5 MG tablet TAKE 2 TABLETS (10 MG TOTAL) BY MOUTH DAILY WITH BREAKFAST. 01/20/20   Plotnikov, [name], MD  rosuvastatin (CRESTOR) 20 MG tablet Take 1 tablet (20 mg total) by mouth daily. 01/07/20 04/06/20  Rollene Rotunda, MD  vitamin B-12 (CYANOCOBALAMIN) 1000 MCG tablet Take 1,000 mcg by mouth daily.    [provider]    Physical Exam: Vitals:   02/05/20 1000 02/05/20 1130 02/05/20 1300 02/05/20 1436  BP: (!) 145/92 (!) 129/99 (!) 139/94 (!) 161/87  Pulse: (!) 109 99 98 (!) 106  Resp: (!) 23 (!) 26  (!) 24  Temp:      TempSrc:      SpO2: 94% 96% 97% 96%  Weight:      Height:        Constitutional: NAD, calm, comfortable Vitals:   02/05/20 1000 02/05/20 1130 02/05/20 1300 02/05/20 1436  BP: (!) 145/92 (!) 129/99 (!) 139/94 (!) 161/87  Pulse: (!) 109 99 98 (!) 106  Resp: (!) 23 (!) 26  (!) 24  Temp:      TempSrc:      SpO2: 94% 96% 97% 96%  Weight:      Height:       Eyes: PERRL, lids and conjunctivae normal ENMT: Mucous membranes are dry. Posterior pharynx clear of any exudate or lesions.Normal dentition.  Neck: normal, supple, no masses, no thyromegaly Respiratory: clear to auscultation bilaterally, no wheezing, no crackles. Normal respiratory effort. No accessory muscle use.  Cardiovascular: Regular rate and rhythm, no murmurs / rubs /  gallops. No extremity edema. 2+ pedal pulses. No carotid bruits.  Abdomen: no tenderness, no masses palpated. No  hepatosplenomegaly. Bowel sounds positive.  Musculoskeletal: no clubbing / cyanosis. No joint deformity upper and lower extremities. Good ROM, no contractures. Normal muscle tone.  Skin: no rashes, lesions, ulcers. No induration Neurologic: CN 2-12 grossly intact. Sensation intact, DTR normal. Strength 5/5 in all 4.  Psychiatric: Normal judgment and insight. Alert and oriented x 3. Very tired.     Labs on Admission: I have personally reviewed following labs and imaging studies  CBC: Recent Labs  Lab 02/05/20 1037  WBC 9.9  NEUTROABS 8.5*  HGB 10.2*  HCT 33.5*  MCV 96.5  PLT 231   Basic Metabolic Panel: Recent Labs  Lab 02/05/20 1037  NA 141  K 3.7  CL 105  CO2 19*  GLUCOSE 98  BUN 43*  CREATININE 1.98*  CALCIUM 9.1   GFR: Estimated Creatinine Clearance: 22.7 mL/min (A) (by C-G formula based on SCr of 1.98 mg/dL (H)). Liver Function Tests: Recent Labs  Lab 02/05/20 1037  AST 80*  ALT 31  ALKPHOS 61  BILITOT 1.3*  PROT 7.8  ALBUMIN 3.1*   Recent Labs  Lab 02/05/20 1037  LIPASE 36   No results for input(s): AMMONIA in the last 168 hours. Coagulation Profile: No results for input(s): INR, PROTIME in the last 168 hours. Cardiac Enzymes: No results for input(s): CKTOTAL, CKMB, CKMBINDEX, TROPONINI in the last 168 hours. BNP (last 3 results) No results for input(s): PROBNP in the last 8760 hours. HbA1C: No results for input(s): HGBA1C in the last 72 hours. CBG: Recent Labs  Lab 02/05/20 0920  GLUCAP 99   Lipid Profile: No results for input(s): CHOL, HDL, LDLCALC, TRIG, CHOLHDL, LDLDIRECT in the last 72 hours. Thyroid Function Tests: No results for input(s): TSH, T4TOTAL, FREET4, T3FREE, THYROIDAB in the last 72 hours. Anemia Panel: No results for input(s): VITAMINB12, FOLATE, FERRITIN, TIBC, IRON, RETICCTPCT in the last 72 hours. Urine analysis:    Component Value Date/Time   COLORURINE YELLOW 02/05/2020 1458   APPEARANCEUR HAZY (A) 02/05/2020  1458   LABSPEC 1.016 02/05/2020 1458   LABSPEC 1.020 04/23/2006 1313   PHURINE 5.0 02/05/2020 1458   GLUCOSEU NEGATIVE 02/05/2020 1458   GLUCOSEU NEGATIVE 02/28/2016 1559   HGBUR NEGATIVE 02/05/2020 1458   BILIRUBINUR NEGATIVE 02/05/2020 1458   BILIRUBINUR Negative 04/23/2006 1313   KETONESUR NEGATIVE 02/05/2020 1458   PROTEINUR 100 (A) 02/05/2020 1458   UROBILINOGEN 0.2 02/28/2016 1559   NITRITE NEGATIVE 02/05/2020 1458   LEUKOCYTESUR NEGATIVE 02/05/2020 1458   LEUKOCYTESUR Trace 04/23/2006 1313    Radiological Exams on Admission: DG Chest Portable 1 View  Result Date: 02/05/2020 CLINICAL DATA:  Fever and chills with cough. EXAM: PORTABLE CHEST 1 VIEW COMPARISON:  10/16/2019 FINDINGS: Asymmetric infiltrate in the left more than right lung. Cardiomegaly and vascular pedicle widening. No visible effusion or pneumothorax. IMPRESSION: Asymmetric airspace disease, consistent with pneumonia in this setting. Electronically Signed   By: Marnee Spring M.D.   On: 02/05/2020 10:22    EKG: Independently reviewed. Sinus arrhythmia, no acute ST-T changes  Assessment/Plan Active Problems:   COVID-19 virus infection   COVID-19  COVID 19 PNA with impending respiratory failure -Start Remdisivir and continue steroid -Symptomatic management with Tussin and breathing meds -D/W pt regarding proning, pt expressed understanding and willing to try -Updated with Daughter [name], all questions answered  AKI From dehydration, will hold Lasix Start gentle hydration  Metabolic acidosis, non-gap From  diarrhea and AKI Hydration as above  UTI Change Cipro given her Hx of Seizure  CAD ASA, Plavix and continue Coreg   HTN Will Add PRN meds  Seizure On Keppra, avoid Quinolones  Deconditioning PT when pt's condition improves   DVT prophylaxis: Heparin SubQ Code Status: Full Family Communication: Daughter over phone Disposition Plan: Expect 2-3 days of hospital stay Consults called:  None Admission status: Tele   Emeline General MD [facility] Pager [phone]    02/05/2020, 3:34 PM

## 2020-02-05 NOTE — Progress Notes (Signed)
[name] [id number] Admission Data: 02/05/2020 6:13 PM Attending Provider: Emeline General, MD  [name], [name], MD Consults/ Treatment Team:   [name] is a 86 y.o. female patient admitted from ED awake, alert  & orientated  X 3,  Full Code, VSS - Blood pressure (!) 164/79, pulse 92, temperature 98.8 F (37.1 C), temperature source Oral, resp. rate 17, height 5\' 6"  (1.676 m), weight 81 kg, SpO2 97 %. no c/o shortness of breath, no c/o chest pain, no distress noted. Tele # 11 placed and pt is currently running:normal sinus rhythm.    Allergies:   Allergies  Allergen Reactions  . Ativan [Lorazepam]     Very confused  . Tramadol Hcl Anxiety and Rash    Headache  . Amlodipine Besylate Other (See Comments)     dizzy  . Atenolol Other (See Comments)    Fatigue  . Benazepril Cough  . Benicar [Olmesartan Medoxomil] Other (See Comments)    HEADACHE  . Cozaar     nausea  . Hydralazine     Hair loss  . Hydrochlorothiazide W-Triamterene Other (See Comments)     dizzy  . Hydrocodone Other (See Comments)    HEADACHE  . Hydroxyzine Pamoate Other (See Comments)    Per MAR  . Iodine Other (See Comments)    Per MAR  . Lisinopril Other (See Comments) and Cough    Tired & fatigue  . Peach Flavor Itching  . Penicillins Itching    Has patient had a PCN reaction causing immediate rash, facial/tongue/throat swelling, SOB or lightheadedness with hypotension: No Has patient had a PCN reaction causing severe rash involving mucus membranes or skin necrosis: No Has patient had a PCN reaction that required hospitalization No Has patient had a PCN reaction occurring within the last 10 years: Yes If all of the above answers are "NO", then may proceed with Cephalosporin use.  tolerates cephalosporins OK   . Pravastatin Other (See Comments)    Myalgias-muscle pain  . Prednisolone Nausea Only and Other (See Comments)    Upset stomach  . Strawberry Flavor Itching  . Tramadol  Hcl Other (See Comments)    headache  . Benadryl [Diphenhydramine] Itching and Palpitations     Past Medical History:  Diagnosis Date  . Anemia    iron deficiency  . Aneurysm, thoracic aortic (HCC)   . Anxiety   . B12 deficiency   . CAD (coronary artery disease)    s/p stenting of LAD 1999- cath 5-08 EF normal LAD 30-40% restenosis. D1 50% D2 80% LCX & RCA minimal plaque  . Chronic back pain   . Constipation   . Depression   . Diabetes mellitus   . DVT (deep venous thrombosis) (HCC)   . GERD (gastroesophageal reflux disease)   . Gout   . HTN (hypertension)   . Hyperlipemia   . Obesity   . Osteoporosis   . Pancreatitis   . Polyarthritis    DJD/ possible PMR  . Renal insufficiency    Cr 1.2-1.3  . Seizures (HCC)   . Tinnitus   . Urinary frequency   . Vertigo   . Vitamin D deficiency       Pt orientation to unit, room and routine. Information packet given to patient/family and safety video watched.  Admission INP armband ID verified with patient/family, and in place. SR up x 2, fall risk assessment complete with Patient and family verbalizing understanding of risks associated with falls. Pt verbalizes  an understanding of how to use the call bell and to call for help before getting out of bed.  Small skin tear noted in coccyx are, foam dressing in place.     Will cont to monitor and assist as needed.  Hosie Spangle, [place] 02/05/2020 6:13 PM

## 2020-02-05 NOTE — ED Provider Notes (Signed)
[facility] EMERGENCY DEPARTMENT Provider Note   CSN: [id number] Arrival date & time: 02/05/20  2585     History Chief Complaint  Patient presents with  . Weakness  . Fever    [name] is a 86 y.o. female.  The history is provided by the patient, a relative and medical records. No language interpreter was used.  Weakness Severity:  Moderate Onset quality:  Gradual Duration:  3 days Timing:  Constant Progression:  Worsening Chronicity:  New Relieved by:  Nothing Worsened by:  Nothing Ineffective treatments:  None tried Associated symptoms: anorexia, cough, diarrhea, dysuria, fever, foul-smelling urine, nausea and shortness of breath   Associated symptoms: no abdominal pain, no chest pain, no dizziness, no falls, no headaches, no loss of consciousness, no near-syncope, no seizures, no stroke symptoms, no syncope and no vomiting   Fever Associated symptoms: chills, cough, diarrhea, dysuria and nausea   Associated symptoms: no chest pain, no congestion, no headaches and no vomiting        Past Medical History:  Diagnosis Date  . Anemia    iron deficiency  . Aneurysm, thoracic aortic (HCC)   . Anxiety   . B12 deficiency   . CAD (coronary artery disease)    s/p stenting of LAD 1999- cath 5-08 EF normal LAD 30-40% restenosis. D1 50% D2 80% LCX & RCA minimal plaque  . Chronic back pain   . Constipation   . Depression   . Diabetes mellitus   . DVT (deep venous thrombosis) (HCC)   . GERD (gastroesophageal reflux disease)   . Gout   . HTN (hypertension)   . Hyperlipemia   . Obesity   . Osteoporosis   . Pancreatitis   . Polyarthritis    DJD/ possible PMR  . Renal insufficiency    Cr 1.2-1.3  . Seizures (HCC)   . Tinnitus   . Urinary frequency   . Vertigo   . Vitamin D deficiency     Patient Active Problem List   Diagnosis Date Noted  . Unstable angina (HCC) 10/16/2019  . Non-ST elevation (NSTEMI) myocardial infarction (HCC)  10/16/2019  . Nausea 06/23/2018  . CHF (congestive heart failure) (HCC) 04/03/2018  . AKI (acute kidney injury) (HCC) 11/20/2017  . Dehydration   . Generalized weakness 11/18/2017  . Second degree burn of foot 11/18/2017  . Seizures (HCC) 07/17/2016  . Hypertensive urgency 07/17/2016  . Acute diastolic CHF (congestive heart failure) (HCC) 07/17/2016  . UTI (urinary tract infection) 02/28/2016  . Urinary incontinence 10/28/2015  . Alopecia 08/12/2015  . TIA (transient ischemic attack) 07/01/2015  . Acute encephalopathy 06/24/2015  . Protein-calorie malnutrition, severe (HCC) 04/19/2015  . Fever   . Abdominal abscess   . Pressure ulcer 04/07/2015  . Acute kidney injury (HCC)   . Sepsis (HCC)   . Stroke (HCC)   . Facial droop   . Confusion 03/26/2015  . Malnutrition of moderate degree (HCC) 03/20/2015  . Small bowel obstruction s/p exlap/LOA/decompression 03/25/2015 03/18/2015  . Essential hypertension 03/18/2015  . Noncompliance with medication regimen 12/07/2014  . Fall against object 06/24/2014  . Contusion of left hand 06/24/2014  . Contusion of left hip 06/24/2014  . Loss of weight 04/12/2014  . Malignant hypertension with renal failure and congestive heart failure (HCC) 09/23/2013  . Knee pain, bilateral 07/27/2013  . Chronic fatigue disorder 01/19/2013  . Vaginitis due to Candida 09/02/2012  . Sinusitis 09/02/2012  . Anemia in other chronic diseases classified elsewhere 09/02/2012  .  Polymyalgia rheumatica (HCC) 09/10/2011  . Vertigo 08/21/2011  . Fatigue 05/11/2011  . ANEMIA OF CHRONIC DISEASE 09/06/2010  . KNEE PAIN, CHRONIC 05/26/2010  . DIZZINESS 05/11/2010  . SHOULDER PAIN 04/25/2010  . FREQUENCY, URINARY 03/16/2010  . CONSTIPATION, CHRONIC 01/17/2010  . FOOT PAIN 01/17/2009  . RASH AND OTHER NONSPECIFIC SKIN ERUPTION 01/17/2009  . TINNITUS NOS 10/18/2008  . B12 deficiency 08/10/2008  . LOW BACK PAIN 06/23/2008  . CHEST WALL PAIN 06/23/2008  . DYSURIA  03/18/2008  . Abdominal pain 03/18/2008  . Hyperlipidemia 12/24/2007  . DYSPNEA 12/24/2007  . Anxiety disorder 12/07/2007  . GERD 12/07/2007  . CHEST PAIN 12/07/2007  . OTHER SPEC FORMS CHRONIC ISCHEMIC HEART DISEASE 12/01/2007  . Type 2 diabetes mellitus with diabetic nephropathy, without long-term current use of insulin (HCC) 11/28/2007  . Pain in joint 11/28/2007  . Gout 09/17/2007  . Adjustment disorder with mixed anxiety and depressed mood 09/17/2007  . Coronary atherosclerosis 09/17/2007  . Disorder resulting from impaired renal function 09/17/2007  . OSTEOPOROSIS 09/17/2007  . DVT, HX OF 09/17/2007  . PANCREATITIS, HX OF 09/17/2007    Past Surgical History:  Procedure Laterality Date  . ABDOMINAL HYSTERECTOMY    . CARDIAC CATHETERIZATION  2008   L main 20%, LAD stent patent, D1 50%, D2 80% (small), RCA 20%, EF 55-60%  . CHOLECYSTECTOMY    . CORONARY ANGIOPLASTY WITH STENT PLACEMENT  1999   LAD stent  . CORONARY STENT INTERVENTION N/A 10/16/2019   Procedure: CORONARY STENT INTERVENTION;  Surgeon: Marykay Lex, MD;  Location[facility] INVASIVE CV LAB;  Service: Cardiovascular;  Laterality: N/A;  . HEMORRHOID SURGERY    . LAPAROSCOPIC LYSIS OF ADHESIONS N/A 03/24/2015   Procedure: LAPAROSCOPIC LYSIS OF ADHESIONS;  Surgeon: Luretha Murphy, MD;  Location: [facility] ORS;  Service: General;  Laterality: N/A;  . LAPAROSCOPY N/A 03/24/2015   Procedure: LAPAROSCOPY DIAGNOSTIC;  Surgeon: Luretha Murphy, MD;  Location: [facility] ORS;  Service: General;  Laterality: N/A;  . LAPAROTOMY N/A 03/24/2015   Procedure: LAPAROTOMY with decompression of bowel;  Surgeon: Luretha Murphy, MD;  Location: [facility] ORS;  Service: General;  Laterality: N/A;  . LEFT HEART CATH AND CORONARY ANGIOGRAPHY N/A 10/16/2019   Procedure: LEFT HEART CATH AND CORONARY ANGIOGRAPHY;  Surgeon: Marykay Lex, MD;  Location: [facility] INVASIVE CV LAB;  Service: Cardiovascular;  Laterality: N/A;  . TUBAL LIGATION       OB History   No obstetric  history on file.     Family History  Adopted: Yes  Problem Relation Age of Onset  . Diabetes Mother   . Hypertension Father     Social History   Tobacco Use  . Smoking status: Never Smoker  . Smokeless tobacco: Never Used  Substance Use Topics  . Alcohol use: No  . Drug use: No    Home Medications Prior to Admission medications   Medication Sig Start Date End Date Taking? Authorizing Provider  acetaminophen (TYLENOL) 500 MG tablet Take 500 mg by mouth every 6 (six) hours as needed.     [provider]  Alcohol Swabs (B-D SINGLE USE SWABS REGULAR) PADS Use to clean area to check blood sugars twice a day 08/27/17   Plotnikov, [name], MD  aspirin 81 MG EC tablet Take 1 tablet (81 mg total) by mouth daily. 10/18/19   Arty Baumgartner, NP  Blood Glucose Calibration (ACCU-CHEK AVIVA) SOLN Use as directed 02/20/17   Plotnikov, [name], MD  carvedilol (COREG) 25 MG tablet  TAKE 1 TABLET BY MOUTH TWICE DAILY WITH MEALS 08/27/19   Plotnikov, [name], MD  Cholecalciferol (VITAMIN D3) 50 MCG (2000 UT) capsule Take 1 capsule (2,000 Units total) by mouth daily. 10/27/19   Plotnikov, [name], MD  ciprofloxacin (CIPRO) 250 MG tablet Take 1 tablet (250 mg total) by mouth 2 (two) times daily. 02/02/20   Plotnikov, [name], MD  clopidogrel (PLAVIX) 75 MG tablet Take 1 tablet (75 mg total) by mouth daily. 12/18/19   Berton Bon, NP  Coenzyme Q10 (COQ10) 200 MG CAPS One daily 10/27/19   Plotnikov, [name], MD  cyclobenzaprine (FLEXERIL) 5 MG tablet Take 1 tablet (5 mg total) by mouth at bedtime as needed for muscle spasms. 08/25/19   Plotnikov, [name], MD  DULoxetine (CYMBALTA) 20 MG capsule Take 1 capsule (20 mg total) by mouth daily. 06/23/19   Myrlene Broker, MD  furosemide (LASIX) 20 MG tablet Take 1 tablet (20 mg total) by mouth daily as needed. Leg swelling 08/18/19   Plotnikov, [name], MD  glucose blood (ONETOUCH VERIO) test strip Use as instructed 08/27/17    Plotnikov, [name], MD  levETIRAcetam (KEPPRA) 500 MG tablet TAKE 1 TABLET TWICE DAILY 09/04/19   Plotnikov, [name], MD  methylPREDNISolone (MEDROL DOSEPAK) 4 MG TBPK tablet As directed 02/02/20   Plotnikov, [name], MD  nitroGLYCERIN (NITROSTAT) 0.4 MG SL tablet Place 1 tablet (0.4 mg total) under the tongue every 5 (five) minutes x 3 doses as needed for chest pain. 12/18/19   Berton Bon, NP  ondansetron (ZOFRAN) 4 MG tablet TAKE 1 TABLET BY MOUTH TWICE DAILY AS NEEDED FOR NAUSEA FOR VOMITING 02/02/20   Plotnikov, [name], MD  [facility] DELICA LANCETS FINE MISC 1 Device by Does not apply route daily as needed. 09/21/16   Plotnikov, [name], MD  predniSONE (DELTASONE) 5 MG tablet TAKE 2 TABLETS (10 MG TOTAL) BY MOUTH DAILY WITH BREAKFAST. 01/20/20   Plotnikov, [name], MD  rosuvastatin (CRESTOR) 20 MG tablet Take 1 tablet (20 mg total) by mouth daily. 01/07/20 04/06/20  Rollene Rotunda, MD  vitamin B-12 (CYANOCOBALAMIN) 1000 MCG tablet Take 1,000 mcg by mouth daily.    [provider]    Allergies    Ativan [lorazepam], Tramadol hcl, Amlodipine besylate, Atenolol, Benazepril, Benicar [olmesartan medoxomil], Cozaar, Hydralazine, Hydrochlorothiazide w-triamterene, Hydrocodone, Hydroxyzine pamoate, Iodine, Lisinopril, Peach flavor, Penicillins, Pravastatin, Prednisolone, Strawberry flavor, Tramadol hcl, and Benadryl [diphenhydramine]  Review of Systems   Review of Systems  Constitutional: Positive for chills, fatigue and fever. Negative for diaphoresis.  HENT: Negative for congestion.   Eyes: Negative for visual disturbance.  Respiratory: Positive for cough and shortness of breath. Negative for choking, chest tightness and wheezing.   Cardiovascular: Negative for chest pain, palpitations, leg swelling, syncope and near-syncope.  Gastrointestinal: Positive for anorexia, diarrhea and nausea. Negative for abdominal pain, constipation and vomiting.  Genitourinary: Positive for  dysuria. Negative for flank pain.  Musculoskeletal: Negative for back pain and falls.  Neurological: Positive for weakness. Negative for dizziness, seizures, loss of consciousness, light-headedness, numbness and headaches.  Psychiatric/Behavioral: Negative for agitation.  All other systems reviewed and are negative.   Physical Exam Updated Vital Signs BP (!) 161/87 (BP Location: Left Arm)   Pulse (!) 106   Temp (!) 100.7 F (38.2 C) (Oral)   Resp (!) 24   Ht  (1.676 m)   Wt 81 kg   SpO2 96%   BMI 28.82 kg/m   Physical Exam Vitals  and nursing note reviewed.  Constitutional:      General: She is not in acute distress.    Appearance: She is well-developed. She is ill-appearing. She is not toxic-appearing or diaphoretic.  HENT:     Head: Normocephalic and atraumatic.     Right Ear: External ear normal.     Left Ear: External ear normal.     Nose: Nose normal. No congestion or rhinorrhea.     Mouth/Throat:     Mouth: Mucous membranes are moist.     Pharynx: No oropharyngeal exudate or posterior oropharyngeal erythema.  Eyes:     Conjunctiva/sclera: Conjunctivae normal.     Pupils: Pupils are equal, round, and reactive to light.  Cardiovascular:     Rate and Rhythm: Tachycardia present.     Pulses: Normal pulses.     Heart sounds: No murmur.  Pulmonary:     Effort: Pulmonary effort is normal. No respiratory distress.     Breath sounds: No stridor. Rhonchi present. No wheezing or rales.  Chest:     Chest wall: No tenderness.  Abdominal:     General: Abdomen is flat. There is no distension.     Tenderness: There is no abdominal tenderness. There is no right CVA tenderness, left CVA tenderness or rebound.  Musculoskeletal:        General: No tenderness.     Cervical back: Normal range of motion and neck supple. No tenderness.     Right lower leg: No edema.     Left lower leg: No edema.  Skin:    General: Skin is warm.     Findings: No erythema or rash.    Neurological:     General: No focal deficit present.     Mental Status: She is alert and oriented to person, place, and time.     Sensory: No sensory deficit.     Motor: No weakness or abnormal muscle tone.     Deep Tendon Reflexes: Reflexes are normal and symmetric.  Psychiatric:        Mood and Affect: Mood normal.     ED Results / Procedures / Treatments   Labs (all labs ordered are listed, but only abnormal results are displayed) Labs Reviewed  CBC WITH DIFFERENTIAL/PLATELET - Abnormal; Notable for the following components:      Result Value   RBC 3.47 (*)    Hemoglobin 10.2 (*)    HCT 33.5 (*)    Neutro Abs 8.5 (*)    All other components within normal limits  COMPREHENSIVE METABOLIC PANEL - Abnormal; Notable for the following components:   CO2 19 (*)    BUN 43 (*)    Creatinine, Ser 1.98 (*)    Albumin 3.1 (*)    AST 80 (*)    Total Bilirubin 1.3 (*)    GFR calc non Af Amer 23 (*)    GFR calc Af Amer 26 (*)    Anion gap 17 (*)    All other components within normal limits  URINALYSIS, ROUTINE W REFLEX MICROSCOPIC - Abnormal; Notable for the following components:   APPearance HAZY (*)    Protein, ur 100 (*)    Bacteria, UA RARE (*)    All other components within normal limits  POC SARS CORONAVIRUS 2 AG -  ED - Abnormal; Notable for the following components:   SARS Coronavirus 2 Ag POSITIVE (*)    All other components within normal limits  TROPONIN I (HIGH SENSITIVITY) - Abnormal; Notable for  the following components:   Troponin I (High Sensitivity) 60 (*)    All other components within normal limits  TROPONIN I (HIGH SENSITIVITY) - Abnormal; Notable for the following components:   Troponin I (High Sensitivity) 60 (*)    All other components within normal limits  CULTURE, BLOOD (ROUTINE X 2)  CULTURE, BLOOD (ROUTINE X 2)  URINE CULTURE  LIPASE, BLOOD  LACTIC ACID, PLASMA  LACTIC ACID, PLASMA  CBG MONITORING, ED    EKG EKG  Interpretation  Date/Time:  Friday February 05 2020 09:17:05 EDT Ventricular Rate:  110 PR Interval:    QRS Duration: 128 QT Interval:  363 QTC Calculation: 492 R Axis:   81 Text Interpretation: Sinus arrhythmia Multiform ventricular premature complexes Right bundle branch block when compared to prior, faster rate and more irregular. No STEMI Confirmed by Antony Blackbird [phone]) on 02/05/2020 1:47:45 PM   Radiology DG Chest Portable 1 View  Result Date: 02/05/2020 CLINICAL DATA:  Fever and chills with cough. EXAM: PORTABLE CHEST 1 VIEW COMPARISON:  10/16/2019 FINDINGS: Asymmetric infiltrate in the left more than right lung. Cardiomegaly and vascular pedicle widening. No visible effusion or pneumothorax. IMPRESSION: Asymmetric airspace disease, consistent with pneumonia in this setting. Electronically Signed   By: Monte Fantasia M.D.   On: 02/05/2020 10:22    Procedures Procedures (including critical care time)  Medications Ordered in ED Medications  benzonatate (TESSALON) capsule 100 mg (has no administration in time range)  aspirin EC tablet 81 mg (has no administration in time range)  carvedilol (COREG) tablet 25 mg (has no administration in time range)  rosuvastatin (CRESTOR) tablet 20 mg (has no administration in time range)  DULoxetine (CYMBALTA) DR capsule 20 mg (has no administration in time range)  clopidogrel (PLAVIX) tablet 75 mg (has no administration in time range)  vitamin B-12 (CYANOCOBALAMIN) tablet 1,000 mcg (has no administration in time range)  cyclobenzaprine (FLEXERIL) tablet 5 mg (has no administration in time range)  levETIRAcetam (KEPPRA) tablet 500 mg (has no administration in time range)  cholecalciferol (VITAMIN D3) tablet 2,000 Units (has no administration in time range)  brimonidine (ALPHAGAN) 0.2 % ophthalmic solution 1 drop (has no administration in time range)  heparin injection 5,000 Units (has no administration in time range)  remdesivir 200 mg in  sodium chloride 0.9% 250 mL IVPB (has no administration in time range)    Followed by  remdesivir 100 mg in sodium chloride 0.9 % 100 mL IVPB (has no administration in time range)  dexamethasone (DECADRON) tablet 6 mg (has no administration in time range)  guaiFENesin-dextromethorphan (ROBITUSSIN DM) 100-10 MG/5ML syrup 10 mL (has no administration in time range)  insulin aspart (novoLOG) injection 0-9 Units (has no administration in time range)  acetaminophen (TYLENOL) tablet 650 mg (has no administration in time range)  senna-docusate (Senokot-S) tablet 1 tablet (has no administration in time range)  ondansetron (ZOFRAN) tablet 4 mg (has no administration in time range)    Or  ondansetron (ZOFRAN) injection 4 mg (has no administration in time range)  0.9 %  sodium chloride infusion (has no administration in time range)  cefTRIAXone (ROCEPHIN) 1 g in sodium chloride 0.9 % 100 mL IVPB (has no administration in time range)  albuterol (VENTOLIN HFA) 108 (90 Base) MCG/ACT inhaler 2 puff (has no administration in time range)  sodium chloride 0.9 % bolus 500 mL (0 mLs Intravenous Stopped 02/05/20 1501)  benzonatate (TESSALON) capsule 100 mg (100 mg Oral Given 02/05/20 1023)    ED Course  I have reviewed the triage vital signs and the nursing notes.  Pertinent labs & imaging results that were available during my care of the patient were reviewed by me and considered in my medical decision making (see chart for details).    MDM Rules/Calculators/A&P                      [name] is a 86 y.o. female with a past medical history significant for diabetes, CAD, hypertension, hyperlipidemia, GERD, prior DVT, thoracic aneurysm, CHF, and currently being treated for urinary tract infection with Cipro who presents with fevers, chills, fatigue, nausea, cough, malaise, decreased oral intake and diarrhea.  Patient is gone by daughter reports that her lateral days patient has been worsening with multiple  symptoms.  Patient has had a dry cough for the last week and she is already been vaccinated with Covid vaccine.  They report that 3 days ago she started and worsening urinary symptoms including dysuria and foul appearance and smell of urine.  She was started on Cipro by PCP via a phone encounter but this does not seem to help.  She is now having fevers and temperature was 100.7 on arrival.  She is still having the urinary symptoms.  He is also having continued to worsen cough and is now having nausea and not eating or drinking.  She is having diarrhea.  She is feeling very fatigued and malaise.  She is having no headache or neck pain or neck stiffness.  She denies any trauma.  She is feeling dehydrated and daughter is concerned about dehydration in the setting of all this.    She denies any chest pain or palpitations but does report some shortness of breath especially with her coughing.  On exam, patient is warm to the touch and ill-appearing.  Her breath sounds are slightly coarse bilaterally.  Chest and abdomen are nontender.  Patient moving all extremities with good pulses and sensation in all extremities.  Normal strength in extremities.  Legs are nontender and minimally edematous.  EKG shows sinus tachycardia with some irregularity.  No STEMI.  Clinically I am concerned that either patient has worsened urinary tract infection causing borderline sepsis with continued urinary symptoms failing outpatient Cipro versus other infection.  We will get screening labs, give some fluids, get chest x-ray, urinalysis, and other labs.  Low suspicion for meningitis given lack of any headache, neck pain, neck stiffness.  Given the patient's appearance, I do anticipate she will require admission after work-up is completed.  Will get rapid Covid test initially and if it is negative, will get PCR test.  Covid test is positive.  Still awaiting urinalysis however due to the patient's fever, continued cough with  shortness of breath, AKI, troponin at 60 which we will trend, and x-ray showing possible pneumonia, we will admit for further management.  We will hold on antibiotics at this time until we speak with medicine team about other pneumonia versus UTI antibiotics.  Patient be treated for COVID-19 infection.   Final Clinical Impression(s) / ED Diagnoses Final diagnoses:  COVID-19  Cough  Shortness of breath  Weakness  Fever, unspecified fever cause  AKI (acute kidney injury) (HCC)    Clinical Impression: 1. COVID-19   2. Cough   3. Shortness of breath   4. Weakness   5. Fever, unspecified fever cause   6. AKI (acute kidney injury) (HCC)     Disposition: Admit  This note was prepared with  assistance of Conservation officer, historic buildings. Occasional wrong-word or sound-a-like substitutions may have occurred due to the inherent limitations of voice recognition software.      Kairi Harshbarger, Canary Brim, MD 02/05/20 [phone]

## 2020-02-05 NOTE — Progress Notes (Signed)
Talked to on call IR Dr. Fredia Sorrow, who told me no PICC procedure available after hour at [facility], D/W nurse, will give pt steroid and change Rocephin from IV to IM for tonight. PICC ordered for AM.

## 2020-02-05 NOTE — Progress Notes (Signed)
MD. Chipper Herb ping was notify of patient not having IV accessed, due to multiple fail attempts by IV team and ED nurse. MD. Is aware pt did not received his remdesivir and Rocephin medication.

## 2020-02-05 NOTE — Progress Notes (Signed)
Spoke with the patient's son, Zoe Lan, to give an update.  Placed Antonio on speaker so that the patient could converse with him as well.  All questions answered to his satisfaction.    Confirmed with Antonio that, moving forward, the family's point of contact would be his sister [name] at [phone].

## 2020-02-06 DIAGNOSIS — R0602 Shortness of breath: Secondary | ICD-10-CM

## 2020-02-06 DIAGNOSIS — N179 Acute kidney failure, unspecified: Secondary | ICD-10-CM

## 2020-02-06 DIAGNOSIS — R509 Fever, unspecified: Secondary | ICD-10-CM

## 2020-02-06 DIAGNOSIS — U071 COVID-19: Principal | ICD-10-CM

## 2020-02-06 LAB — COMPREHENSIVE METABOLIC PANEL
ALT: 25 U/L (ref 0–44)
AST: 56 U/L — ABNORMAL HIGH (ref 15–41)
Albumin: 2.6 g/dL — ABNORMAL LOW (ref 3.5–5.0)
Alkaline Phosphatase: 54 U/L (ref 38–126)
Anion gap: 16 — ABNORMAL HIGH (ref 5–15)
BUN: 38 mg/dL — ABNORMAL HIGH (ref 8–23)
CO2: 18 mmol/L — ABNORMAL LOW (ref 22–32)
Calcium: 8.8 mg/dL — ABNORMAL LOW (ref 8.9–10.3)
Chloride: 105 mmol/L (ref 98–111)
Creatinine, Ser: 1.53 mg/dL — ABNORMAL HIGH (ref 0.44–1.00)
GFR calc Af Amer: 36 mL/min — ABNORMAL LOW (ref >60)
GFR calc non Af Amer: 31 mL/min — ABNORMAL LOW (ref >60)
Glucose, Bld: 180 mg/dL — ABNORMAL HIGH (ref 70–99)
Potassium: 4 mmol/L (ref 3.5–5.1)
Sodium: 139 mmol/L (ref 135–145)
Total Bilirubin: 1.2 mg/dL (ref 0.3–1.2)
Total Protein: 7.3 g/dL (ref 6.5–8.1)

## 2020-02-06 LAB — MAGNESIUM: Magnesium: 1.7 mg/dL (ref 1.7–2.4)

## 2020-02-06 LAB — CBC WITH DIFFERENTIAL/PLATELET
Abs Immature Granulocytes: 0.05 10*3/uL (ref 0.00–0.07)
Basophils Absolute: 0 10*3/uL (ref 0.0–0.1)
Basophils Relative: 0 %
Eosinophils Absolute: 0 10*3/uL (ref 0.0–0.5)
Eosinophils Relative: 0 %
HCT: 31.3 % — ABNORMAL LOW (ref 36.0–46.0)
Hemoglobin: 9.7 g/dL — ABNORMAL LOW (ref 12.0–15.0)
Immature Granulocytes: 1 %
Lymphocytes Relative: 12 %
Lymphs Abs: 0.7 10*3/uL (ref 0.7–4.0)
MCH: 29.3 pg (ref 26.0–34.0)
MCHC: 31 g/dL (ref 30.0–36.0)
MCV: 94.6 fL (ref 80.0–100.0)
Monocytes Absolute: 0.1 10*3/uL (ref 0.1–1.0)
Monocytes Relative: 1 %
Neutro Abs: 4.8 10*3/uL (ref 1.7–7.7)
Neutrophils Relative %: 86 %
Platelets: 226 10*3/uL (ref 150–400)
RBC: 3.31 MIL/uL — ABNORMAL LOW (ref 3.87–5.11)
RDW: 13.2 % (ref 11.5–15.5)
WBC: 5.6 10*3/uL (ref 4.0–10.5)
nRBC: 0 % (ref 0.0–0.2)

## 2020-02-06 LAB — D-DIMER, QUANTITATIVE: D-Dimer, Quant: 2.77 ug/mL-FEU — ABNORMAL HIGH (ref 0.00–0.50)

## 2020-02-06 LAB — URINE CULTURE: Culture: NO GROWTH

## 2020-02-06 LAB — FERRITIN: Ferritin: 373 ng/mL — ABNORMAL HIGH (ref 11–307)

## 2020-02-06 LAB — GLUCOSE, CAPILLARY
Glucose-Capillary: 156 mg/dL — ABNORMAL HIGH (ref 70–99)
Glucose-Capillary: 198 mg/dL — ABNORMAL HIGH (ref 70–99)
Glucose-Capillary: 173 mg/dL — ABNORMAL HIGH (ref 70–99)
Glucose-Capillary: 157 mg/dL — ABNORMAL HIGH (ref 70–99)

## 2020-02-06 LAB — PHOSPHORUS: Phosphorus: 4.4 mg/dL (ref 2.5–4.6)

## 2020-02-06 LAB — C-REACTIVE PROTEIN: CRP: 20.1 mg/dL — ABNORMAL HIGH (ref <1.0)

## 2020-02-06 LAB — PROCALCITONIN: Procalcitonin: 0.3 ng/mL

## 2020-02-06 MED ORDER — CHLORHEXIDINE GLUCONATE CLOTH 2 % EX PADS
6.0000 | MEDICATED_PAD | Freq: Every day | CUTANEOUS | Status: DC
  Administered 2020-02-06 – 2020-02-09 (×3): 6 via TOPICAL

## 2020-02-06 MED ORDER — SODIUM CHLORIDE 0.9 % IV SOLN
100.0000 mg | Freq: Every day | INTRAVENOUS | Status: AC
  Administered 2020-02-07 – 2020-02-10 (×4): 100 mg via INTRAVENOUS
  Filled 2020-02-06 (×4): qty 20

## 2020-02-06 MED ORDER — SODIUM CHLORIDE 0.9% FLUSH: 10.0000 mL | INTRAVENOUS | Status: DC | PRN

## 2020-02-06 MED ORDER — AMLODIPINE BESYLATE 10 MG PO TABS
10.0000 mg | ORAL_TABLET | Freq: Every day | ORAL | Status: DC
  Administered 2020-02-06 – 2020-02-11 (×6): 10 mg via ORAL
  Filled 2020-02-06 (×6): qty 1

## 2020-02-06 MED ORDER — DEXAMETHASONE SODIUM PHOSPHATE 10 MG/ML IJ SOLN
6.0000 mg | INTRAMUSCULAR | Status: DC
  Administered 2020-02-06 – 2020-02-09 (×4): 6 mg via INTRAVENOUS
  Filled 2020-02-06 (×4): qty 1

## 2020-02-06 MED ORDER — SODIUM CHLORIDE 0.9% FLUSH
10.0000 mL | Freq: Two times a day (BID) | INTRAVENOUS | Status: DC
  Administered 2020-02-06: 10 mL

## 2020-02-06 MED ORDER — HEPARIN SODIUM (PORCINE) 5000 UNIT/ML IJ SOLN
5000.0000 [IU] | Freq: Three times a day (TID) | INTRAMUSCULAR | Status: DC
  Administered 2020-02-06 – 2020-02-11 (×15): 5000 [IU] via SUBCUTANEOUS
  Filled 2020-02-06 (×15): qty 1

## 2020-02-06 MED ORDER — ALBUTEROL SULFATE HFA 108 (90 BASE) MCG/ACT IN AERS
2.0000 | INHALATION_SPRAY | RESPIRATORY_TRACT | Status: DC | PRN
  Administered 2020-02-06: 2 via RESPIRATORY_TRACT
  Filled 2020-02-06: qty 6.7

## 2020-02-06 NOTE — Progress Notes (Signed)
Peripherally Inserted Central Catheter Placement  The IV Nurse has discussed with the patient and/or persons authorized to consent for the patient, the purpose of this procedure and the potential benefits and risks involved with this procedure.  The benefits include less needle sticks, lab draws from the catheter, and the patient may be discharged home with the catheter. Risks include, but not limited to, infection, bleeding, blood clot (thrombus formation), and puncture of an artery; nerve damage and irregular heartbeat and possibility to perform a PICC exchange if needed/ordered by physician.  Alternatives to this procedure were also discussed.  Bard Power PICC patient education guide, fact sheet on infection prevention and patient information card has been provided to patient /or left at bedside.    PICC Placement Documentation  PICC Single Lumen 02/06/20 PICC Right Brachial 40 cm 3 cm (Active)  Indication for Insertion or Continuance of Line Poor Vasculature-patient has had multiple peripheral attempts or PIVs lasting less than 24 hours;Limited venous access - need for IV therapy >5 days (PICC only) 02/06/20 0923  Exposed Catheter (cm) 3 cm 02/06/20 6945  Site Assessment Clean;Dry;Intact 02/06/20 0923  Line Status Flushed;Saline locked;Blood return noted 02/06/20 0923  Dressing Type Transparent 02/06/20 0923  Dressing Status Clean;Dry;Intact;Antimicrobial disc in place 02/06/20 0923  Line Care Connections checked and tightened 02/06/20 0923  Line Adjustment (NICU/IV Team Only) No 02/06/20 0923  Dressing Intervention New dressing 02/06/20 0923  Dressing Change Due 02/13/20 02/06/20 0923       Elliot Dally 02/06/2020, 9:24 AM

## 2020-02-06 NOTE — Progress Notes (Signed)
Notified [facility]odenheimer of possible atrial flutter per tele.

## 2020-02-06 NOTE — Progress Notes (Signed)
[facility] paged to make aware that tele called stating that the pt had 8 beats of AIVR slow vtach. All other VS stable. Will continue to monitor.

## 2020-02-06 NOTE — Progress Notes (Addendum)
PROGRESS NOTE                                                                                                                                                                                                             Patient Demographics:    [name], is a 86 y.o. female, DOB - 1933-11-06, BVQ:[id number]  Outpatient Primary MD for the patient is Plotnikov, Evie Lacks, MD    LOS - 1  Admit date - 02/05/2020    Chief Complaint  Patient presents with  . Weakness  . Fever       Brief Narrative  - 86 y.o. female with medical history significant of CAD, HTN, HLD, Seizure presented with increasing fatigue, dry cough and increasing SOB for 8 days. Pt had her 2nd COVID shot on 03/16. She has been feeling fine until about 8 days ago, she started to have dry cough and congestion, no chest pain, subjective fever but no chills, and gradually she feels tired and sleepy, poor appetite, and loose diarrhea x 2 yesterday. She started complaining of some weakness and nausea came to the ER after which she was diagnosed with COVID-19 pneumonia and admitted to the hospital.   Subjective:    [name] today has, No headache, No chest pain, No abdominal pain - No Nausea, No new weakness tingling or numbness, mild cough - No SOB.   Assessment  & Plan :     1. Acute Covid 19 Viral Pneumonitis during the ongoing 2020 Covid 19 Pandemic - she seems to have moderate disease has been started on steroids and remdesivir, since her CRP is high we will switch her to IV steroids instead of oral, currently not hypoxic will be monitored closely.  In case her inflammation gets worse she has consented for Actemra use.  Encouraged the patient to sit up in chair in the daytime use I-S and flutter valve for pulmonary toiletry and then prone in bed when at night.  Actemra off label use - patient was told that if COVID-19 pneumonitis gets worse we might  potentially use Actemra off label, patient denies any known history of tuberculosis or hepatitis, understands the risks and benefits and wants to proceed with Actemra treatment if required.   SpO2: 97 %  Recent Labs  Lab 02/06/20 0318  CRP 20.1*  DDIMER 2.77*  PROCALCITON 0.30    Hepatic Function Latest Ref Rng & Units 02/06/2020 02/05/2020 03/26/2018  Total Protein 6.5 - 8.1 g/dL 7.3 7.8 7.5  Albumin 3.5 - 5.0 g/dL 2.6(L) 3.1(L) 3.6  AST 15 - 41 U/L 56(H) 80(H) 24  ALT 0 - 44 U/L 25 31 12(L)  Alk Phosphatase 38 - 126 U/L 54 61 61  Total Bilirubin 0.3 - 1.2 mg/dL 1.2 1.3(H) 0.4  Bilirubin, Direct 0.0 - 0.3 mg/dL - - -     2.  Dehydration with AKI, IV fluids and monitor.  Hold nephrotoxins.  3.  History of seizures.  Stable on Keppra.  4.  Weakness and deconditioning.  PT OT and supportive care.  5.  Anion gap metabolic acidosis.  From diarrhea.  Supportive care and monitor.  6.  UTI.  Currently on Rocephin will give 3 doses.  Follow cultures.  7.  Essential hypertension.  On Coreg, multiple allergies, start on Norvasc(mild allergy of dizziness) and monitor.     Condition - Extremely Guarded  Family Communication  :  Daughter [name] [phone] - 02/06/20  Code Status :  Full  Diet :   Diet Order            Diet Heart Room service appropriate? Yes; Fluid consistency: Thin  Diet effective now               Disposition Plan  :  Stay in the Hospital to complete Covid treatment  Consults  : None  Procedures  :    PUD Prophylaxis : None  DVT Prophylaxis  :    Heparin    Lab Results  Component Value Date   PLT 226 02/06/2020    Inpatient Medications  Scheduled Meds: . aspirin EC  81 mg Oral Daily  . brimonidine  1 drop Both Eyes BID  . carvedilol  25 mg Oral BID WC  . Chlorhexidine Gluconate Cloth  6 each Topical Daily  . cholecalciferol  2,000 Units Oral Daily  . clopidogrel  75 mg Oral Daily  . dexamethasone  6 mg Oral Q24H  . DULoxetine  20 mg  Oral Daily  . heparin  5,000 Units Subcutaneous Q8H  . insulin aspart  0-9 Units Subcutaneous TID WC  . levETIRAcetam  500 mg Oral BID  . rosuvastatin  20 mg Oral Daily  . sodium chloride flush  10-40 mL Intracatheter Q12H  . vitamin B-12  1,000 mcg Oral Daily   Continuous Infusions: . sodium chloride Stopped (02/05/20 1920)  . cefTRIAXone (ROCEPHIN)  IV Stopped (02/05/20 1920)  . [START ON 02/07/2020] remdesivir 100 mg in NS 100 mL     PRN Meds:.acetaminophen, albuterol, cyclobenzaprine, guaiFENesin-dextromethorphan, [DISCONTINUED] ondansetron **OR** ondansetron (ZOFRAN) IV, senna-docusate, sodium chloride flush  Antibiotics  :    Anti-infectives (From admission, onward)   Start     Dose/Rate Route Frequency Ordered Stop   02/07/20 1000  remdesivir 100 mg in sodium chloride 0.9 % 100 mL IVPB     100 mg 200 mL/hr over 30 Minutes Intravenous Daily 02/06/20 1049 02/11/20 0959   02/06/20 1000  remdesivir 100 mg in sodium chloride 0.9 % 100 mL IVPB  Status:  Discontinued     100 mg 200 mL/hr over 30 Minutes Intravenous Daily 02/05/20 1525 02/06/20 1049   02/05/20 1815  cefTRIAXone (ROCEPHIN) injection 1 g     1 g Intramuscular  Once 02/05/20 1800 02/05/20 2113   02/05/20 1545  cefTRIAXone (ROCEPHIN) 1 g in sodium chloride 0.9 %  100 mL IVPB     1 g 200 mL/hr over 30 Minutes Intravenous Every 24 hours 02/05/20 1533     02/05/20 1530  remdesivir 200 mg in sodium chloride 0.9% 250 mL IVPB     200 mg 580 mL/hr over 30 Minutes Intravenous Once 02/05/20 1525 02/06/20 1048       Time Spent in minutes  30   Lala Lund M.D on 02/06/2020 at 12:14 PM  To page go to [email or web address] - password [facility]  [facility] -  Office  [phone]    See all Orders from today for further details    Objective:   Vitals:   02/06/20 0000 02/06/20 0354 02/06/20 0744 02/06/20 1211  BP: 126/71 (!) 147/95 (!) 160/86 (!) 165/90  Pulse: 94 87 84 71  Resp: 14 13 15 18   Temp:  98.4 F (36.9 C)  97.9 F (36.6 C) (!) 95.5 F (35.3 C)  TempSrc:  Oral Oral Axillary  SpO2: 95% 97% 95% 97%  Weight:      Height:        Wt Readings from Last 3 Encounters:  02/05/20 81 kg  01/29/20 80.9 kg  11/02/19 76.7 kg     Intake/Output Summary (Last 24 hours) at 02/06/2020 1214 Last data filed at 02/05/2020 1501 Gross per 24 hour  Intake 50 ml  Output --  Net 50 ml     Physical Exam  Awake Alert, No new F.N deficits, Normal affect [place].AT,PERRAL Supple Neck,No JVD, No cervical lymphadenopathy appriciated.  Symmetrical Chest wall movement, Good air movement bilaterally, CTAB RRR,No Gallops,Rubs or new Murmurs, No Parasternal Heave +ve B.Sounds, Abd Soft, No tenderness, No organomegaly appriciated, No rebound - guarding or rigidity. No Cyanosis, Clubbing or edema, No new Rash or bruise    Data Review:    CBC Recent Labs  Lab 02/05/20 1037 02/06/20 0318  WBC 9.9 5.6  HGB 10.2* 9.7*  HCT 33.5* 31.3*  PLT 231 226  MCV 96.5 94.6  MCH 29.4 29.3  MCHC 30.4 31.0  RDW 13.0 13.2  LYMPHSABS 0.8 0.7  MONOABS 0.5 0.1  EOSABS 0.1 0.0  BASOSABS 0.0 0.0    Chemistries  Recent Labs  Lab 02/05/20 1037 02/06/20 0318  NA 141 139  K 3.7 4.0  CL 105 105  CO2 19* 18*  GLUCOSE 98 180*  BUN 43* 38*  CREATININE 1.98* 1.53*  CALCIUM 9.1 8.8*  AST 80* 56*  ALT 31 25  ALKPHOS 61 54  BILITOT 1.3* 1.2  MG  --  1.7     ------------------------------------------------------------------------------------------------------------------ No results for input(s): CHOL, HDL, LDLCALC, TRIG, CHOLHDL, LDLDIRECT in the last 72 hours.  Lab Results  Component Value Date   HGBA1C 6.6 (H) 10/16/2019   ------------------------------------------------------------------------------------------------------------------ No results for input(s): TSH, T4TOTAL, T3FREE, THYROIDAB in the last 72 hours.  Invalid input(s): FREET3  Cardiac Enzymes No results for input(s): CKMB, TROPONINI, MYOGLOBIN in  the last 168 hours.  Invalid input(s): CK ------------------------------------------------------------------------------------------------------------------    Component Value Date/Time   BNP 380.4 (H) 03/26/2018 1155    Micro Results Recent Results (from the past 240 hour(s))  Urine culture     Status: None   Collection Time: 02/05/20  2:59 PM   Specimen: Urine, Random  Result Value Ref Range Status   Specimen Description URINE, RANDOM  Final   Special Requests NONE  Final   Culture   Final    NO GROWTH Performed at [facility], [address], [place], [place]  [address]    Report Status 02/06/2020 FINAL  Final    Radiology Reports DG Chest Portable 1 View  Result Date: 02/05/2020 CLINICAL DATA:  Fever and chills with cough. EXAM: PORTABLE CHEST 1 VIEW COMPARISON:  10/16/2019 FINDINGS: Asymmetric infiltrate in the left more than right lung. Cardiomegaly and vascular pedicle widening. No visible effusion or pneumothorax. IMPRESSION: Asymmetric airspace disease, consistent with pneumonia in this setting. Electronically Signed   By: Monte Fantasia M.D.   On: 02/05/2020 10:22   [place] EKG SITE RITE  Result Date: 02/05/2020 If Site Rite image not attached, placement could not be confirmed due to current cardiac rhythm.

## 2020-02-06 NOTE — Progress Notes (Signed)
Spoke with the patient's daughter, [name], to update her on patient's status throughout the night.  All questions answered to her satisfaction.  [name] stated that she would forward the information to the rest of the family.

## 2020-02-06 NOTE — Evaluation (Signed)
Physical Therapy Evaluation Patient Details Name: [name] MRN: [id number] DOB: 24-Jul-1933 Today's Date: 02/06/2020   History of Present Illness  86 y.o. female with medical history significant of CAD, HTN, HLD, Seizure presented with increasing fatigue, dry cough and increasing SOB for 8 days. Pt had her 2nd COVID shot on 03/16. She has been feeling fine until about 8 days ago, she started to have dry cough and congestion, no chest pain, subjective fever but no chills, and gradually she feels tired and sleepy, poor appetite, and loose diarrhea x2 yesterday. In ED foud to be COVID positive, X-ray B/L infiltrates, BMP showed Cre 1.98. Admitted 02/05/20 for treatment   Clinical Impression  PTA pt living with husband and son in 2 story home with 3 steps to enter and bed and bath upstairs. Pt does have stair lift to get up stairs. [name] husband is currently in hospital after falling down the stairs. [name] son is "in and out" and "probably where I got COVID" Pt reports independence with limited community distance ambulation with her Rollator and ability to complete ADLs. Son/daughter assist with shopping. Pt currently limited in safe mobility by decreased safety awareness, in presence of decreased strength and endurance. Pt is min guard for coming EoB but requires UE assist to move her own LE and is reliant on HOB elevation and heavy use of bedrail. Pt is maxA for pivot transfer to/from [facility] and total A for management of LE back into bed. PT recommends SNF level rehab at discharge. PT will continue to follow acutely.     Follow Up Recommendations SNF    Equipment Recommendations  Other (comment)(TBD at next venue)    Recommendations for Other Services OT consult     Precautions / Restrictions Precautions Precautions: Fall Restrictions Weight Bearing Restrictions: No      Mobility  Bed Mobility Overal bed mobility: Needs Assistance Bed Mobility: Supine to Sit;Sit to Supine     Supine to  sit: Min guard;HOB elevated Sit to supine: HOB elevated;Total assist   General bed mobility comments: min guard for safety, requires UE to assist in moving LE to EoB, heavy use of bed rail to pull to EoB, total A for managment of LE back to bed  Transfers Overall transfer level: Needs assistance Equipment used: 1 person hand held assist Transfers: Stand Pivot Transfers   Stand pivot transfers: Max assist       General transfer comment: maxA for stand pivot transfer to [facility], increased time and effort to come to fully upright, max multimodal cuing for hand placement, maxA for pivot back, better hand placement   Ambulation/Gait             General Gait Details: unable      Balance Overall balance assessment: Needs assistance Sitting-balance support: Feet supported;Feet unsupported;No upper extremity supported Sitting balance-Leahy Scale: Fair     Standing balance support: Bilateral upper extremity supported Standing balance-Leahy Scale: Poor                               Pertinent Vitals/Pain Pain Assessment: No/denies pain    Home Living Family/patient expects to be discharged to:: Private residence Living Arrangements: Spouse/significant other;Children(son in and out, husband currently in hospital after falling ) Available Help at Discharge: Family;Available PRN/intermittently Type of Home: House Home Access: Stairs to enter Entrance Stairs-Rails: Can reach both Entrance Stairs-Number of Steps: 3 Home Layout: Bed/bath upstairs;1/2 bath on main level;Two  level Home Equipment: Walker - 4 wheels;Shower seat;Grab bars - tub/shower;Hand held shower head      Prior Function Level of Independence: Independent with assistive device(s)         Comments: uses Rollator for limited community distances, doesn't drive any more        Extremity/Trunk Assessment   Upper Extremity Assessment Upper Extremity Assessment: Generalized weakness    Lower  Extremity Assessment Lower Extremity Assessment: RLE deficits/detail;LLE deficits/detail RLE Deficits / Details: hip strength grossly 2+/5, knee 3+/5, ankle 3+/5, ROM WFL  LLE Deficits / Details: hip strength grossly 2+/5, knee 3+/5, ankle 3+/5, ROM WFL    Cervical / Trunk Assessment Cervical / Trunk Assessment: Kyphotic  Communication   Communication: No difficulties  Cognition Arousal/Alertness: Awake/alert Behavior During Therapy: WFL for tasks assessed/performed Overall Cognitive Status: Within Functional Limits for tasks assessed                                        General Comments General comments (skin integrity, edema, etc.): VSS on RA        Assessment/Plan    PT Assessment Patient needs continued PT services  PT Problem List Decreased strength;Decreased activity tolerance;Decreased balance;Decreased mobility;Decreased coordination;Decreased safety awareness       PT Treatment Interventions DME instruction;Gait training;Functional mobility training;Therapeutic activities;Therapeutic exercise;Balance training;Cognitive remediation;Patient/family education    PT Goals (Current goals can be found in the Care Plan section)  Acute Rehab PT Goals Patient Stated Goal: get strength back PT Goal Formulation: With patient Time For Goal Achievement: 02/20/20 Potential to Achieve Goals: Good    Frequency Min 2X/week   Barriers to discharge Decreased caregiver support         AM-PAC PT "6 Clicks" Mobility  Outcome Measure Help needed turning from your back to your side while in a flat bed without using bedrails?: None Help needed moving from lying on your back to sitting on the side of a flat bed without using bedrails?: A Little Help needed moving to and from a bed to a chair (including a wheelchair)?: Total Help needed standing up from a chair using your arms (e.g., wheelchair or bedside chair)?: Total Help needed to walk in hospital room?:  Total Help needed climbing 3-5 steps with a railing? : Total 6 Click Score: 11    End of Session Equipment Utilized During Treatment: Gait belt Activity Tolerance: Patient tolerated treatment well Patient left: in bed;with call bell/phone within reach;with bed alarm set Nurse Communication: Mobility status;Other (comment)(incontinence in standing ) PT Visit Diagnosis: Unsteadiness on feet (R26.81);Other abnormalities of gait and mobility (R26.89);Muscle weakness (generalized) (M62.81);Difficulty in walking, not elsewhere classified (R26.2)    Time: 7867-6720 PT Time Calculation (min) (ACUTE ONLY): 39 min   Charges:   PT Evaluation $PT Eval Moderate Complexity: 1 Mod PT Treatments $Therapeutic Activity: 23-37 mins        Lavaeh Bau B. Migdalia Dk PT, DPT Acute Rehabilitation Services Pager [phone] Office [phone]   [place] 02/06/2020, 10:45 AM

## 2020-02-07 LAB — CBC WITH DIFFERENTIAL/PLATELET
Abs Immature Granulocytes: 0.06 10*3/uL (ref 0.00–0.07)
Basophils Absolute: 0 10*3/uL (ref 0.0–0.1)
Basophils Relative: 0 %
Eosinophils Absolute: 0 10*3/uL (ref 0.0–0.5)
Eosinophils Relative: 0 %
HCT: 29 % — ABNORMAL LOW (ref 36.0–46.0)
Hemoglobin: 9.2 g/dL — ABNORMAL LOW (ref 12.0–15.0)
Immature Granulocytes: 1 %
Lymphocytes Relative: 12 %
Lymphs Abs: 0.8 10*3/uL (ref 0.7–4.0)
MCH: 29.6 pg (ref 26.0–34.0)
MCHC: 31.7 g/dL (ref 30.0–36.0)
MCV: 93.2 fL (ref 80.0–100.0)
Monocytes Absolute: 0.3 10*3/uL (ref 0.1–1.0)
Monocytes Relative: 5 %
Neutro Abs: 5.1 10*3/uL (ref 1.7–7.7)
Neutrophils Relative %: 82 %
Platelets: 260 10*3/uL (ref 150–400)
RBC: 3.11 MIL/uL — ABNORMAL LOW (ref 3.87–5.11)
RDW: 13.1 % (ref 11.5–15.5)
WBC: 6.3 10*3/uL (ref 4.0–10.5)
nRBC: 0 % (ref 0.0–0.2)

## 2020-02-07 LAB — GLUCOSE, CAPILLARY
Glucose-Capillary: 151 mg/dL — ABNORMAL HIGH (ref 70–99)
Glucose-Capillary: 240 mg/dL — ABNORMAL HIGH (ref 70–99)
Glucose-Capillary: 174 mg/dL — ABNORMAL HIGH (ref 70–99)
Glucose-Capillary: 287 mg/dL — ABNORMAL HIGH (ref 70–99)

## 2020-02-07 LAB — COMPREHENSIVE METABOLIC PANEL
ALT: 24 U/L (ref 0–44)
AST: 39 U/L (ref 15–41)
Albumin: 2.3 g/dL — ABNORMAL LOW (ref 3.5–5.0)
Alkaline Phosphatase: 48 U/L (ref 38–126)
Anion gap: 11 (ref 5–15)
BUN: 38 mg/dL — ABNORMAL HIGH (ref 8–23)
CO2: 21 mmol/L — ABNORMAL LOW (ref 22–32)
Calcium: 8.7 mg/dL — ABNORMAL LOW (ref 8.9–10.3)
Chloride: 106 mmol/L (ref 98–111)
Creatinine, Ser: 1.19 mg/dL — ABNORMAL HIGH (ref 0.44–1.00)
GFR calc Af Amer: 49 mL/min — ABNORMAL LOW (ref >60)
GFR calc non Af Amer: 42 mL/min — ABNORMAL LOW (ref >60)
Glucose, Bld: 180 mg/dL — ABNORMAL HIGH (ref 70–99)
Potassium: 3.9 mmol/L (ref 3.5–5.1)
Sodium: 138 mmol/L (ref 135–145)
Total Bilirubin: 0.9 mg/dL (ref 0.3–1.2)
Total Protein: 7 g/dL (ref 6.5–8.1)

## 2020-02-07 LAB — C-REACTIVE PROTEIN: CRP: 14.1 mg/dL — ABNORMAL HIGH (ref <1.0)

## 2020-02-07 LAB — BRAIN NATRIURETIC PEPTIDE: B Natriuretic Peptide: 576.8 pg/mL — ABNORMAL HIGH (ref 0.0–100.0)

## 2020-02-07 LAB — MAGNESIUM: Magnesium: 1.7 mg/dL (ref 1.7–2.4)

## 2020-02-07 LAB — D-DIMER, QUANTITATIVE: D-Dimer, Quant: 2.06 ug/mL-FEU — ABNORMAL HIGH (ref 0.00–0.50)

## 2020-02-07 LAB — PROCALCITONIN: Procalcitonin: 0.14 ng/mL

## 2020-02-07 MED ORDER — FUROSEMIDE 10 MG/ML IJ SOLN
40.0000 mg | Freq: Once | INTRAMUSCULAR | Status: AC
  Administered 2020-02-07: 40 mg via INTRAVENOUS
  Filled 2020-02-07: qty 4

## 2020-02-07 MED ORDER — GLUCERNA SHAKE PO LIQD
237.0000 mL | Freq: Three times a day (TID) | ORAL | Status: DC
  Administered 2020-02-07 – 2020-02-11 (×11): 237 mL via ORAL

## 2020-02-07 NOTE — Progress Notes (Signed)
PROGRESS NOTE                                                                                                                                                                                                             Patient Demographics:    [name], is a 86 y.o. female, DOB - 1933/01/01, CVU:[id number]  Outpatient Primary MD for the patient is Plotnikov, Evie Lacks, MD    LOS - 2  Admit date - 02/05/2020    Chief Complaint  Patient presents with  . Weakness  . Fever       Brief Narrative  - 86 y.o. female with medical history significant of CAD, HTN, HLD, Seizure presented with increasing fatigue, dry cough and increasing SOB for 8 days. Pt had her 2nd COVID shot on 03/16. She has been feeling fine until about 8 days ago, she started to have dry cough and congestion, no chest pain, subjective fever but no chills, and gradually she feels tired and sleepy, poor appetite, and loose diarrhea x 2 yesterday. She started complaining of some weakness and nausea came to the ER after which she was diagnosed with COVID-19 pneumonia and admitted to the hospital.   Subjective:    Patient in bed, appears comfortable, denies any headache, no fever, no chest pain or pressure, no shortness of breath , no abdominal pain. No focal weakness.   Assessment  & Plan :     1. Acute Covid 19 Viral Pneumonitis during the ongoing 2020 Covid 19 Pandemic - she seems to have moderate disease has been started on steroids and remdesivir, since her CRP is high we will switch her to IV steroids instead of oral, currently not hypoxic will be monitored closely.  Clinically seems to have stabilized and gradually improving, monitor closely.  Encouraged the patient to sit up in chair in the daytime use I-S and flutter valve for pulmonary toiletry and then prone in bed when at night.  SpO2: 100 %  Recent Labs  Lab 02/06/20 0318 02/07/20 0343  02/07/20 0413  CRP 20.1* 14.1*  --   DDIMER 2.77* 2.06*  --   BNP  --   --  576.8*  PROCALCITON 0.30 0.14  --     Hepatic Function Latest Ref Rng & Units 02/07/2020 02/06/2020 02/05/2020  Total Protein 6.5 - 8.1 g/dL 7.0  7.3 7.8  Albumin 3.5 - 5.0 g/dL 2.3(L) 2.6(L) 3.1(L)  AST 15 - 41 U/L 39 56(H) 80(H)  ALT 0 - 44 U/L 24 25 31   Alk Phosphatase 38 - 126 U/L 48 54 61  Total Bilirubin 0.3 - 1.2 mg/dL 0.9 1.2 1.3(H)  Bilirubin, Direct 0.0 - 0.3 mg/dL - - -     2.  Dehydration with AKI - resolved after hydration with IV fluids.  3.  History of seizures.  Stable on Keppra.  4.  Weakness and deconditioning.  PT OT and supportive care.  5.  Anion gap metabolic acidosis.  From diarrhea.  Supportive care and monitor.  6.  UTI.  Currently on Rocephin will give 3 doses.  Follow cultures.  7.  Essential hypertension.  On Coreg, multiple allergies, start on Norvasc(mild allergy of dizziness) and monitor.     Condition - Extremely Guarded  Family Communication  :  Daughter [name] [phone] - 02/06/20, 02/07/20  Code Status :  Full  Diet :   Diet Order            Diet Heart Room service appropriate? Yes; Fluid consistency: Thin  Diet effective now               Disposition Plan  :  Stay in the Hospital to complete Covid treatment  Consults  : None  Procedures  :    PUD Prophylaxis : None  DVT Prophylaxis  :    Heparin    Lab Results  Component Value Date   PLT 260 02/07/2020    Inpatient Medications  Scheduled Meds: . amLODipine  10 mg Oral Daily  . aspirin EC  81 mg Oral Daily  . brimonidine  1 drop Both Eyes BID  . carvedilol  25 mg Oral BID WC  . Chlorhexidine Gluconate Cloth  6 each Topical Daily  . cholecalciferol  2,000 Units Oral Daily  . clopidogrel  75 mg Oral Daily  . dexamethasone (DECADRON) injection  6 mg Intravenous Q24H  . DULoxetine  20 mg Oral Daily  . heparin  5,000 Units Subcutaneous Q8H  . insulin aspart  0-9 Units Subcutaneous TID WC   . levETIRAcetam  500 mg Oral BID  . rosuvastatin  20 mg Oral Daily  . vitamin B-12  1,000 mcg Oral Daily   Continuous Infusions: . cefTRIAXone (ROCEPHIN)  IV 1 g (02/06/20 1537)  . remdesivir 100 mg in NS 100 mL 100 mg (02/07/20 0902)   PRN Meds:.acetaminophen, albuterol, cyclobenzaprine, guaiFENesin-dextromethorphan, [DISCONTINUED] ondansetron **OR** ondansetron (ZOFRAN) IV, senna-docusate  Antibiotics  :    Anti-infectives (From admission, onward)   Start     Dose/Rate Route Frequency Ordered Stop   02/07/20 1000  remdesivir 100 mg in sodium chloride 0.9 % 100 mL IVPB     100 mg 200 mL/hr over 30 Minutes Intravenous Daily 02/06/20 1049 02/11/20 0959   02/06/20 1000  remdesivir 100 mg in sodium chloride 0.9 % 100 mL IVPB  Status:  Discontinued     100 mg 200 mL/hr over 30 Minutes Intravenous Daily 02/05/20 1525 02/06/20 1049   02/05/20 1815  cefTRIAXone (ROCEPHIN) injection 1 g     1 g Intramuscular  Once 02/05/20 1800 02/05/20 2113   02/05/20 1545  cefTRIAXone (ROCEPHIN) 1 g in sodium chloride 0.9 % 100 mL IVPB     1 g 200 mL/hr over 30 Minutes Intravenous Every 24 hours 02/05/20 1533 02/08/20 1544   02/05/20 1530  remdesivir 200 mg in sodium  chloride 0.9% 250 mL IVPB     200 mg 580 mL/hr over 30 Minutes Intravenous Once 02/05/20 1525 02/06/20 1048       Time Spent in minutes  30   Lala Lund M.D on 02/07/2020 at 10:00 AM  To page go to [email or web address] - password [email or web address]  [facility] -  Office  [phone]    See all Orders from today for further details    Objective:   Vitals:   02/06/20 2036 02/07/20 0000 02/07/20 0400 02/07/20 0800  BP: 136/73 132/81 (!) 151/79 134/73  Pulse: 83 78 81 89  Resp: 15 12 14 15   Temp: 98 F (36.7 C) 98 F (36.7 C) 98.1 F (36.7 C) (!) 97.5 F (36.4 C)  TempSrc: Oral Oral Oral Oral  SpO2: 96% 95% 95% 100%  Weight:   76.1 kg   Height:        Wt Readings from Last 3 Encounters:  02/07/20 76.1 kg  01/29/20 80.9 kg   11/02/19 76.7 kg     Intake/Output Summary (Last 24 hours) at 02/07/2020 1000 Last data filed at 02/07/2020 0900 Gross per 24 hour  Intake 120 ml  Output 400 ml  Net -280 ml     Physical Exam  Awake Alert, No new F.N deficits, Normal affect [place].AT,PERRAL Supple Neck,No JVD, No cervical lymphadenopathy appriciated.  Symmetrical Chest wall movement, Good air movement bilaterally, CTAB RRR,No Gallops, Rubs or new Murmurs, No Parasternal Heave +ve B.Sounds, Abd Soft, No tenderness, No organomegaly appriciated, No rebound - guarding or rigidity. No Cyanosis, Clubbing or edema, No new Rash or bruise     Data Review:    CBC Recent Labs  Lab 02/05/20 1037 02/06/20 0318 02/07/20 0343  WBC 9.9 5.6 6.3  HGB 10.2* 9.7* 9.2*  HCT 33.5* 31.3* 29.0*  PLT 231 226 260  MCV 96.5 94.6 93.2  MCH 29.4 29.3 29.6  MCHC 30.4 31.0 31.7  RDW 13.0 13.2 13.1  LYMPHSABS 0.8 0.7 0.8  MONOABS 0.5 0.1 0.3  EOSABS 0.1 0.0 0.0  BASOSABS 0.0 0.0 0.0    Chemistries  Recent Labs  Lab 02/05/20 1037 02/06/20 0318 02/07/20 0343  NA 141 139 138  K 3.7 4.0 3.9  CL 105 105 106  CO2 19* 18* 21*  GLUCOSE 98 180* 180*  BUN 43* 38* 38*  CREATININE 1.98* 1.53* 1.19*  CALCIUM 9.1 8.8* 8.7*  AST 80* 56* 39  ALT 31 25 24   ALKPHOS 61 54 48  BILITOT 1.3* 1.2 0.9  MG  --  1.7 1.7     ------------------------------------------------------------------------------------------------------------------ No results for input(s): CHOL, HDL, LDLCALC, TRIG, CHOLHDL, LDLDIRECT in the last 72 hours.  Lab Results  Component Value Date   HGBA1C 6.6 (H) 10/16/2019   ------------------------------------------------------------------------------------------------------------------ No results for input(s): TSH, T4TOTAL, T3FREE, THYROIDAB in the last 72 hours.  Invalid input(s): FREET3  Cardiac Enzymes No results for input(s): CKMB, TROPONINI, MYOGLOBIN in the last 168 hours.  Invalid input(s):  CK ------------------------------------------------------------------------------------------------------------------  Micro Results Recent Results (from the past 240 hour(s))  Blood culture (routine x 2)     Status: None (Preliminary result)   Collection Time: 02/05/20 10:37 AM   Specimen: BLOOD  Result Value Ref Range Status   Specimen Description BLOOD BLOOD RIGHT HAND  Final   Special Requests   Final    AEROBIC BOTTLE ONLY Blood Culture results may not be optimal due to an inadequate volume of blood received in culture bottles   Culture   Final  NO GROWTH 2 DAYS Performed at [facility], [place] [address], [place], [place] [address]    Report Status PENDING  Incomplete  Blood culture (routine x 2)     Status: None (Preliminary result)   Collection Time: 02/05/20  1:43 PM   Specimen: BLOOD  Result Value Ref Range Status   Specimen Description BLOOD BLOOD LEFT HAND  Final   Special Requests   Final    BOTTLES DRAWN AEROBIC AND ANAEROBIC Blood Culture adequate volume   Culture   Final    NO GROWTH 2 DAYS Performed at [facility], [place] [address], [place], [place] [address]    Report Status PENDING  Incomplete  Urine culture     Status: None   Collection Time: 02/05/20  2:59 PM   Specimen: Urine, Random  Result Value Ref Range Status   Specimen Description URINE, RANDOM  Final   Special Requests NONE  Final   Culture   Final    NO GROWTH Performed at [facility], [place] [address], [place], [place] [address]    Report Status 02/06/2020 FINAL  Final    Radiology Reports DG Chest Portable 1 View  Result Date: 02/05/2020 CLINICAL DATA:  Fever and chills with cough. EXAM: PORTABLE CHEST 1 VIEW COMPARISON:  10/16/2019 FINDINGS: Asymmetric infiltrate in the left more than right lung. Cardiomegaly and vascular pedicle widening. No visible effusion or pneumothorax. IMPRESSION: Asymmetric airspace disease, consistent with pneumonia in this setting. Electronically  Signed   By: Monte Fantasia M.D.   On: 02/05/2020 10:22   [place] EKG SITE RITE  Result Date: 02/05/2020 If Site Rite image not attached, placement could not be confirmed due to current cardiac rhythm.

## 2020-02-07 NOTE — Evaluation (Signed)
Occupational Therapy Evaluation Patient Details Name: [name] MRN: [id number] DOB: [birth year] Today's Date: 02/07/2020    History of Present Illness 86 y.o. female with medical history significant of CAD, HTN, HLD, Seizure presented with increasing fatigue, dry cough and increasing SOB for 8 days. Pt had her 2nd COVID shot on 03/16. She has been feeling fine until about 8 days ago, she started to have dry cough and congestion, no chest pain, subjective fever but no chills, and gradually she feels tired and sleepy, poor appetite, and loose diarrhea x2 yesterday. In ED foud to be COVID positive, X-ray B/L infiltrates, BMP showed Cre 1.98. Admitted 02/05/20 for treatment    Clinical Impression   This 86 y/o female presents with the above. PTA pt living with spouse (who is currently hospitalized due to fall), and completing ADL and mobility tasks at mod independent level using rollator. Pt presenting with lethargy today, notable weakness and decreased activity tolerance. Pt requiring modA for bed mobility, tolerating sitting EOB with close supervision for static balance. Pt only able to tolerate sitting for brief period of time prior to requesting return to supine due to fatigue. She currently requires at least minA for UB ADL, max-totalA for LB ADL and suspect will require two person assist for mobility beyond EOB pending level of fatigue. Pt will benefit from continued acute OT services and currently recommend follow up therapy services in SNF setting after discharge to progress her towards her PLOF. Will follow.     Follow Up Recommendations  SNF;Supervision/Assistance - 24 hour    Equipment Recommendations  Other (comment);3 in 1 bedside commode(TBD)           Precautions / Restrictions Precautions Precautions: Fall Restrictions Weight Bearing Restrictions: No      Mobility Bed Mobility Overal bed mobility: Needs Assistance Bed Mobility: Supine to Sit;Sit to Supine     Supine  to sit: Mod assist;HOB elevated Sit to supine: HOB elevated;Mod assist   General bed mobility comments: assist for trunk elevation and to scoot hips; assist for LEs onto EOB when returning to supine  Transfers                 General transfer comment: deferred, pt notably fatigued and only able to tolerate sitting EOB for short time before requesting return to supine     Balance Overall balance assessment: Needs assistance Sitting-balance support: Feet supported;Feet unsupported;No upper extremity supported Sitting balance-Leahy Scale: Fair                                     ADL either performed or assessed with clinical judgement   ADL Overall ADL's : Needs assistance/impaired Eating/Feeding: Set up;Bed level   Grooming: Set up;Bed level Grooming Details (indicate cue type and reason): declined performing while seated EOB due to fatigue  Upper Body Bathing: Minimal assistance;Bed level   Lower Body Bathing: Maximal assistance;Bed level   Upper Body Dressing : Minimal assistance;Bed level           Toileting- Clothing Manipulation and Hygiene: Total assistance;Bed level         General ADL Comments: pt with limitations including fatigue, general weakness, impaired cognition. Pt tolerated sitting EOB but only for a brief period, declined participation in ADL tasks while seated EOB stating "I just need to rest"  Pertinent Vitals/Pain Pain Assessment: No/denies pain     Hand Dominance     Extremity/Trunk Assessment Upper Extremity Assessment Upper Extremity Assessment: Generalized weakness(grossly 3/5 throughout)   Lower Extremity Assessment Lower Extremity Assessment: Defer to PT evaluation   Cervical / Trunk Assessment Cervical / Trunk Assessment: Kyphotic   Communication Communication Communication: No difficulties   Cognition Arousal/Alertness: Lethargic Behavior During Therapy: WFL for tasks  assessed/performed Overall Cognitive Status: No family/caregiver present to determine baseline cognitive functioning                                 General Comments: pt with difficulty answering questions this session, some confusion noted. Possibly due to lethargy but will continue to assess    General Comments  pt in Afib with HR 80s-90s, O2 sats >90% on RA    Exercises     Shoulder Instructions      Home Living Family/patient expects to be discharged to:: Private residence Living Arrangements: Spouse/significant other;Children(son in and out, husband currently in hospital after falling ) Available Help at Discharge: Family;Available PRN/intermittently Type of Home: House Home Access: Stairs to enter [employer] of Steps: 3 Entrance Stairs-Rails: Can reach both Home Layout: Bed/bath upstairs;1/2 bath on main level;Two level Alternate Level Stairs-Number of Steps: 12(have a stair lift )   Bathroom Shower/Tub: Occupational psychologist: Handicapped height Bathroom Accessibility: Yes   Home Equipment: Environmental consultant - 4 wheels;Shower seat;Grab bars - tub/shower;Hand held shower head   Additional Comments: much of home setup obtained from PT eval, pt very sleepy today and having difficulty answering questions       Prior Functioning/Environment Level of Independence: Independent with assistive device(s)        Comments: uses Rollator for limited community distances, doesn't drive any more        OT Problem List: Decreased strength;Decreased range of motion;Decreased activity tolerance;Impaired balance (sitting and/or standing);Cardiopulmonary status limiting activity;Obesity;Decreased cognition      OT Treatment/Interventions: Self-care/ADL training;Therapeutic exercise;Energy conservation;DME and/or AE instruction;Therapeutic activities;Cognitive remediation/compensation;Patient/family education;Balance training    OT Goals(Current goals can be  found in the care plan section) Acute Rehab OT Goals Patient Stated Goal: get strength back OT Goal Formulation: With patient Time For Goal Achievement: 02/21/20 Potential to Achieve Goals: Good  OT Frequency: Min 2X/week   Barriers to D/C:            Co-evaluation              AM-PAC OT "6 Clicks" Daily Activity     Outcome Measure Help from another person eating meals?: A Little Help from another person taking care of personal grooming?: A Little Help from another person toileting, which includes using toliet, bedpan, or urinal?: Total Help from another person bathing (including washing, rinsing, drying)?: A Lot Help from another person to put on and taking off regular upper body clothing?: A Lot Help from another person to put on and taking off regular lower body clothing?: Total 6 Click Score: 12   End of Session Nurse Communication: Mobility status  Activity Tolerance: Patient limited by lethargy;Patient limited by fatigue Patient left: in bed;with call bell/phone within reach;with bed alarm set  OT Visit Diagnosis: Muscle weakness (generalized) (M62.81);Other abnormalities of gait and mobility (R26.89)                Time: 1610-9604 OT Time Calculation (min): 12 min Charges:  OT General Charges $OT  Visit: 1 Visit OT Evaluation $OT Eval Moderate Complexity: 1 Mod  Marcy Siren, OT Acute Rehabilitation Services Pager [phone] Office [phone]   Orlando Penner 02/07/2020, 5:12 PM

## 2020-02-08 ENCOUNTER — Telehealth: Payer: Self-pay | Admitting: Internal Medicine

## 2020-02-08 LAB — GLUCOSE, CAPILLARY
Glucose-Capillary: 276 mg/dL — ABNORMAL HIGH (ref 70–99)
Glucose-Capillary: 312 mg/dL — ABNORMAL HIGH (ref 70–99)
Glucose-Capillary: 281 mg/dL — ABNORMAL HIGH (ref 70–99)
Glucose-Capillary: 264 mg/dL — ABNORMAL HIGH (ref 70–99)

## 2020-02-08 LAB — CBC WITH DIFFERENTIAL/PLATELET
Abs Immature Granulocytes: 0.09 10*3/uL — ABNORMAL HIGH (ref 0.00–0.07)
Basophils Absolute: 0 10*3/uL (ref 0.0–0.1)
Basophils Relative: 0 %
Eosinophils Absolute: 0 10*3/uL (ref 0.0–0.5)
Eosinophils Relative: 0 %
HCT: 29.7 % — ABNORMAL LOW (ref 36.0–46.0)
Hemoglobin: 9.5 g/dL — ABNORMAL LOW (ref 12.0–15.0)
Immature Granulocytes: 1 %
Lymphocytes Relative: 8 %
Lymphs Abs: 0.6 10*3/uL — ABNORMAL LOW (ref 0.7–4.0)
MCH: 29.2 pg (ref 26.0–34.0)
MCHC: 32 g/dL (ref 30.0–36.0)
MCV: 91.4 fL (ref 80.0–100.0)
Monocytes Absolute: 0.3 10*3/uL (ref 0.1–1.0)
Monocytes Relative: 5 %
Neutro Abs: 6.5 10*3/uL (ref 1.7–7.7)
Neutrophils Relative %: 86 %
Platelets: 290 10*3/uL (ref 150–400)
RBC: 3.25 MIL/uL — ABNORMAL LOW (ref 3.87–5.11)
RDW: 13.1 % (ref 11.5–15.5)
WBC: 7.5 10*3/uL (ref 4.0–10.5)
nRBC: 0 % (ref 0.0–0.2)

## 2020-02-08 LAB — COMPREHENSIVE METABOLIC PANEL
ALT: 22 U/L (ref 0–44)
AST: 35 U/L (ref 15–41)
Albumin: 2.4 g/dL — ABNORMAL LOW (ref 3.5–5.0)
Alkaline Phosphatase: 50 U/L (ref 38–126)
Anion gap: 12 (ref 5–15)
BUN: 51 mg/dL — ABNORMAL HIGH (ref 8–23)
CO2: 23 mmol/L (ref 22–32)
Calcium: 8.8 mg/dL — ABNORMAL LOW (ref 8.9–10.3)
Chloride: 102 mmol/L (ref 98–111)
Creatinine, Ser: 1.49 mg/dL — ABNORMAL HIGH (ref 0.44–1.00)
GFR calc Af Amer: 37 mL/min — ABNORMAL LOW (ref >60)
GFR calc non Af Amer: 32 mL/min — ABNORMAL LOW (ref >60)
Glucose, Bld: 328 mg/dL — ABNORMAL HIGH (ref 70–99)
Potassium: 4 mmol/L (ref 3.5–5.1)
Sodium: 137 mmol/L (ref 135–145)
Total Bilirubin: 0.6 mg/dL (ref 0.3–1.2)
Total Protein: 7.1 g/dL (ref 6.5–8.1)

## 2020-02-08 LAB — MAGNESIUM: Magnesium: 1.6 mg/dL — ABNORMAL LOW (ref 1.7–2.4)

## 2020-02-08 LAB — PROCALCITONIN: Procalcitonin: 0.13 ng/mL

## 2020-02-08 LAB — D-DIMER, QUANTITATIVE: D-Dimer, Quant: 2.01 ug/mL-FEU — ABNORMAL HIGH (ref 0.00–0.50)

## 2020-02-08 LAB — C-REACTIVE PROTEIN: CRP: 9.9 mg/dL — ABNORMAL HIGH (ref <1.0)

## 2020-02-08 LAB — BRAIN NATRIURETIC PEPTIDE: B Natriuretic Peptide: 240.2 pg/mL — ABNORMAL HIGH (ref 0.0–100.0)

## 2020-02-08 MED ORDER — LACTATED RINGERS IV SOLN: INTRAVENOUS | Status: AC

## 2020-02-08 NOTE — Progress Notes (Signed)
PROGRESS NOTE                                                                                                                                                                                                             Patient Demographics:    [name], is a 86 y.o. female, DOB - 08-Mar-1933, PZW:[id number]  Outpatient Primary MD for the patient is Plotnikov, Evie Lacks, MD    LOS - 3  Admit date - 02/05/2020    Chief Complaint  Patient presents with  . Weakness  . Fever       Brief Narrative  - 86 y.o. female with medical history significant of CAD, HTN, HLD, Seizure presented with increasing fatigue, dry cough and increasing SOB for 8 days. Pt had her 2nd COVID shot on 03/16. She has been feeling fine until about 8 days ago, she started to have dry cough and congestion, no chest pain, subjective fever but no chills, and gradually she feels tired and sleepy, poor appetite, and loose diarrhea x 2 yesterday. She started complaining of some weakness and nausea came to the ER after which she was diagnosed with COVID-19 pneumonia and admitted to the hospital.   Subjective:    Patient in bed, appears comfortable, denies any headache, no fever, no chest pain or pressure, no shortness of breath , no abdominal pain. No focal weakness.   Assessment  & Plan :     1. Acute Covid 19 Viral Pneumonitis during the ongoing 2020 Covid 19 Pandemic - she seems to have moderate disease has been started on steroids and remdesivir, since her CRP is high we will switch her to IV steroids instead of oral, currently not hypoxic will be monitored closely. Clinically seems to have stabilized and gradually improving, monitor closely.  Encouraged the patient to sit up in chair in the daytime use I-S and flutter valve for pulmonary toiletry and then prone in bed when at night.     Recent Labs  Lab 02/06/20 0318 02/07/20 0343 02/07/20 0413  02/08/20 0315  CRP 20.1* 14.1*  --  9.9*  DDIMER 2.77* 2.06*  --  2.01*  BNP  --   --  576.8* 240.2*  PROCALCITON 0.30 0.14  --  0.13    Hepatic Function Latest Ref Rng & Units 02/08/2020 02/07/2020 02/06/2020  Total Protein 6.5 -  8.1 g/dL 7.1 7.0 7.3  Albumin 3.5 - 5.0 g/dL 2.4(L) 2.3(L) 2.6(L)  AST 15 - 41 U/L 35 39 56(H)  ALT 0 - 44 U/L 22 24 25   Alk Phosphatase 38 - 126 U/L 50 48 54  Total Bilirubin 0.3 - 1.2 mg/dL 0.6 0.9 1.2  Bilirubin, Direct 0.0 - 0.3 mg/dL - - -     2.  Dehydration with AKI - had improved after IV fluids, slightly worse on 02/08/2020 again, gently hydrate with IV fluids for another 10 hours.  3.  History of seizures.  Stable on Keppra.  4.  Weakness and deconditioning.  PT OT and supportive care.  5.  Anion gap metabolic acidosis.  From diarrhea.  Supportive care and monitor.  6.  UTI.  Currently on Rocephin will give 3 doses.  Follow cultures.  7.  Essential hypertension.  On Coreg, multiple allergies, started on Norvasc(mild allergy of dizziness)  Remains stable.   Condition - Extremely Guarded  Family Communication  :  Daughter [name] [phone] - 02/06/20, 02/07/20  Code Status :  Full  Diet :   Diet Order            Diet Heart Room service appropriate? Yes; Fluid consistency: Thin  Diet effective now               Disposition Plan  :  Stay in the Hospital to complete Covid treatment  Consults  : None  Procedures  :    PUD Prophylaxis : None  DVT Prophylaxis  :    Heparin    Lab Results  Component Value Date   PLT 290 02/08/2020    Inpatient Medications  Scheduled Meds: . amLODipine  10 mg Oral Daily  . aspirin EC  81 mg Oral Daily  . brimonidine  1 drop Both Eyes BID  . carvedilol  25 mg Oral BID WC  . Chlorhexidine Gluconate Cloth  6 each Topical Daily  . cholecalciferol  2,000 Units Oral Daily  . clopidogrel  75 mg Oral Daily  . dexamethasone (DECADRON) injection  6 mg Intravenous Q24H  . DULoxetine  20 mg Oral  Daily  . feeding supplement (GLUCERNA SHAKE)  237 mL Oral TID BM  . heparin  5,000 Units Subcutaneous Q8H  . insulin aspart  0-9 Units Subcutaneous TID WC  . levETIRAcetam  500 mg Oral BID  . rosuvastatin  20 mg Oral Daily  . vitamin B-12  1,000 mcg Oral Daily   Continuous Infusions: . cefTRIAXone (ROCEPHIN)  IV 1 g (02/07/20 1604)  . remdesivir 100 mg in NS 100 mL 100 mg (02/08/20 0933)   PRN Meds:.acetaminophen, albuterol, cyclobenzaprine, guaiFENesin-dextromethorphan, [DISCONTINUED] ondansetron **OR** ondansetron (ZOFRAN) IV, senna-docusate  Antibiotics  :    Anti-infectives (From admission, onward)   Start     Dose/Rate Route Frequency Ordered Stop   02/07/20 1000  remdesivir 100 mg in sodium chloride 0.9 % 100 mL IVPB     100 mg 200 mL/hr over 30 Minutes Intravenous Daily 02/06/20 1049 02/11/20 0959   02/06/20 1000  remdesivir 100 mg in sodium chloride 0.9 % 100 mL IVPB  Status:  Discontinued     100 mg 200 mL/hr over 30 Minutes Intravenous Daily 02/05/20 1525 02/06/20 1049   02/05/20 1815  cefTRIAXone (ROCEPHIN) injection 1 g     1 g Intramuscular  Once 02/05/20 1800 02/05/20 2113   02/05/20 1545  cefTRIAXone (ROCEPHIN) 1 g in sodium chloride 0.9 % 100 mL IVPB  1 g 200 mL/hr over 30 Minutes Intravenous Every 24 hours 02/05/20 1533 02/08/20 1544   02/05/20 1530  remdesivir 200 mg in sodium chloride 0.9% 250 mL IVPB     200 mg 580 mL/hr over 30 Minutes Intravenous Once 02/05/20 1525 02/06/20 1048       Time Spent in minutes  30   Lala Lund M.D on 02/08/2020 at 10:26 AM  To page go to [email or web address] - password [place]  [facility] -  Office  [phone]    See all Orders from today for further details    Objective:   Vitals:   02/08/20 0000 02/08/20 0413 02/08/20 0806 02/08/20 0913  BP: (!) 109/54 (!) 141/93 (!) 146/102 (!) 152/56  Pulse: 70 69 74   Resp: 17 13 15    Temp: 98 F (36.7 C) 97.7 F (36.5 C) (!) 97.5 F (36.4 C)   TempSrc: Oral Oral  Axillary   SpO2: 98% 98% (!) 85%   Weight:      Height:        Wt Readings from Last 3 Encounters:  02/07/20 76.1 kg  01/29/20 80.9 kg  11/02/19 76.7 kg     Intake/Output Summary (Last 24 hours) at 02/08/2020 1026 Last data filed at 02/08/2020 0543 Gross per 24 hour  Intake 0 ml  Output 500 ml  Net -500 ml     Physical Exam  Awake Alert, No new F.N deficits, Normal affect [place].AT,PERRAL Supple Neck,No JVD, No cervical lymphadenopathy appriciated.  Symmetrical Chest wall movement, Good air movement bilaterally, CTAB RRR,No Gallops, Rubs or new Murmurs, No Parasternal Heave +ve B.Sounds, Abd Soft, No tenderness, No organomegaly appriciated, No rebound - guarding or rigidity. No Cyanosis, Clubbing or edema, No new Rash or bruise    Data Review:    CBC Recent Labs  Lab 02/05/20 1037 02/06/20 0318 02/07/20 0343 02/08/20 0315  WBC 9.9 5.6 6.3 7.5  HGB 10.2* 9.7* 9.2* 9.5*  HCT 33.5* 31.3* 29.0* 29.7*  PLT 231 226 260 290  MCV 96.5 94.6 93.2 91.4  MCH 29.4 29.3 29.6 29.2  MCHC 30.4 31.0 31.7 32.0  RDW 13.0 13.2 13.1 13.1  LYMPHSABS 0.8 0.7 0.8 0.6*  MONOABS 0.5 0.1 0.3 0.3  EOSABS 0.1 0.0 0.0 0.0  BASOSABS 0.0 0.0 0.0 0.0    Chemistries  Recent Labs  Lab 02/05/20 1037 02/06/20 0318 02/07/20 0343 02/08/20 0315  NA 141 139 138 137  K 3.7 4.0 3.9 4.0  CL 105 105 106 102  CO2 19* 18* 21* 23  GLUCOSE 98 180* 180* 328*  BUN 43* 38* 38* 51*  CREATININE 1.98* 1.53* 1.19* 1.49*  CALCIUM 9.1 8.8* 8.7* 8.8*  AST 80* 56* 39 35  ALT 31 25 24 22   ALKPHOS 61 54 48 50  BILITOT 1.3* 1.2 0.9 0.6  MG  --  1.7 1.7 1.6*     ------------------------------------------------------------------------------------------------------------------ No results for input(s): CHOL, HDL, LDLCALC, TRIG, CHOLHDL, LDLDIRECT in the last 72 hours.  Lab Results  Component Value Date   HGBA1C 6.6 (H) 10/16/2019    ------------------------------------------------------------------------------------------------------------------ No results for input(s): TSH, T4TOTAL, T3FREE, THYROIDAB in the last 72 hours.  Invalid input(s): FREET3  Cardiac Enzymes No results for input(s): CKMB, TROPONINI, MYOGLOBIN in the last 168 hours.  Invalid input(s): CK ------------------------------------------------------------------------------------------------------------------  Micro Results Recent Results (from the past 240 hour(s))  Blood culture (routine x 2)     Status: None (Preliminary result)   Collection Time: 02/05/20 10:37 AM   Specimen: BLOOD  Result Value Ref Range Status   Specimen Description BLOOD BLOOD RIGHT HAND  Final   Special Requests   Final    AEROBIC BOTTLE ONLY Blood Culture results may not be optimal due to an inadequate volume of blood received in culture bottles Performed at [facility] [address], [place], [place] [address]    Culture NO GROWTH 3 DAYS  Final   Report Status PENDING  Incomplete  Blood culture (routine x 2)     Status: None (Preliminary result)   Collection Time: 02/05/20  1:43 PM   Specimen: BLOOD  Result Value Ref Range Status   Specimen Description BLOOD BLOOD LEFT HAND  Final   Special Requests   Final    BOTTLES DRAWN AEROBIC AND ANAEROBIC Blood Culture adequate volume Performed at [facility], [place] [address] [place], [place] [address]    Culture NO GROWTH 3 DAYS  Final   Report Status PENDING  Incomplete  Urine culture     Status: None   Collection Time: 02/05/20  2:59 PM   Specimen: Urine, Random  Result Value Ref Range Status   Specimen Description URINE, RANDOM  Final   Special Requests NONE  Final   Culture   Final    NO GROWTH Performed at [facility], [place] [address], [place], [place] [address]    Report Status 02/06/2020 FINAL  Final    Radiology Reports DG Chest Portable 1 View  Result Date: 02/05/2020 CLINICAL  DATA:  Fever and chills with cough. EXAM: PORTABLE CHEST 1 VIEW COMPARISON:  10/16/2019 FINDINGS: Asymmetric infiltrate in the left more than right lung. Cardiomegaly and vascular pedicle widening. No visible effusion or pneumothorax. IMPRESSION: Asymmetric airspace disease, consistent with pneumonia in this setting. Electronically Signed   By: Monte Fantasia M.D.   On: 02/05/2020 10:22   [place] EKG SITE RITE  Result Date: 02/05/2020 If Site Rite image not attached, placement could not be confirmed due to current cardiac rhythm.

## 2020-02-08 NOTE — Progress Notes (Signed)
Inpatient Diabetes Program Recommendations  AACE/ADA: New Consensus Statement on Inpatient Glycemic Control (2015)  Target Ranges:  Prepandial:   less than 140 mg/dL      Peak postprandial:   less than 180 mg/dL (1-2 hours)      Critically ill patients:  140 - 180 mg/dL   Lab Results  Component Value Date   GLUCAP 312 (H) 02/08/2020   HGBA1C 6.6 (H) 10/16/2019    Review of Glycemic Control Results for Shelby, Jefferson (MRN [id number]) as of 02/08/2020 13:01  Ref. Range 02/07/2020 08:05 02/07/2020 11:58 02/07/2020 17:11 02/07/2020 21:13 02/08/2020 08:11 02/08/2020 12:23  Glucose-Capillary Latest Ref Range: 70 - 99 mg/dL 517 (H) 616 (H) 073 (H) 287 (H) 276 (H) 312 (H)   Inpatient Diabetes Program Recommendations:   While on steroids: -Increase Novolog correction to moderate tid  Thank you, [name]. Yanelis Osika, RN, MSN, CDE  Diabetes Coordinator Inpatient Glycemic Control Team Team Pager [phone] (8am-5pm) 02/08/2020 1:02 PM

## 2020-02-08 NOTE — NC FL2 (Addendum)
[place] MEDICAID FL2 LEVEL OF CARE SCREENING TOOL     IDENTIFICATION  Patient Name: [name] Birthdate: [birth year] Sex: female Admission Date (Current Location): 02/05/2020  [facility] and [place] Number:  Producer, television/film/video and Address:  [facility], [address] [place], [place] [address]      Provider Number: [id number]  Attending Physician Name and Address:  Leroy Sea, MD  Relative Name and Phone Number:  [name], daughter, [phone]    Current Level of Care: Hospital Recommended Level of Care: Skilled Nursing Facility Prior Approval Number:    Date Approved/Denied:   PASRR Number:   [id number]  Discharge Plan: SNF    Current Diagnoses: Patient Active Problem List   Diagnosis Date Noted  . COVID-19 virus infection 02/05/2020  . COVID-19 02/05/2020  . Unstable angina (HCC) 10/16/2019  . Non-ST elevation (NSTEMI) myocardial infarction (HCC) 10/16/2019  . Nausea 06/23/2018  . CHF (congestive heart failure) (HCC) 04/03/2018  . AKI (acute kidney injury) (HCC) 11/20/2017  . Dehydration   . Generalized weakness 11/18/2017  . Second degree burn of foot 11/18/2017  . Seizures (HCC) 07/17/2016  . Hypertensive urgency 07/17/2016  . Acute diastolic CHF (congestive heart failure) (HCC) 07/17/2016  . UTI (urinary tract infection) 02/28/2016  . Urinary incontinence 10/28/2015  . Alopecia 08/12/2015  . TIA (transient ischemic attack) 07/01/2015  . Acute encephalopathy 06/24/2015  . Protein-calorie malnutrition, severe (HCC) 04/19/2015  . Fever   . Abdominal abscess   . Pressure ulcer 04/07/2015  . Acute kidney injury (HCC)   . Sepsis (HCC)   . Stroke (HCC)   . Facial droop   . Confusion 03/26/2015  . Malnutrition of moderate degree (HCC) 03/20/2015  . Small bowel obstruction s/p exlap/LOA/decompression 03/25/2015 03/18/2015  . Essential hypertension 03/18/2015  . Noncompliance with medication regimen 12/07/2014  . Fall  against object 06/24/2014  . Contusion of left hand 06/24/2014  . Contusion of left hip 06/24/2014  . Loss of weight 04/12/2014  . Malignant hypertension with renal failure and congestive heart failure (HCC) 09/23/2013  . Knee pain, bilateral 07/27/2013  . Chronic fatigue disorder 01/19/2013  . Vaginitis due to Candida 09/02/2012  . Sinusitis 09/02/2012  . Anemia in other chronic diseases classified elsewhere 09/02/2012  . Polymyalgia rheumatica (HCC) 09/10/2011  . Vertigo 08/21/2011  . Fatigue 05/11/2011  . ANEMIA OF CHRONIC DISEASE 09/06/2010  . KNEE PAIN, CHRONIC 05/26/2010  . DIZZINESS 05/11/2010  . SHOULDER PAIN 04/25/2010  . FREQUENCY, URINARY 03/16/2010  . CONSTIPATION, CHRONIC 01/17/2010  . FOOT PAIN 01/17/2009  . RASH AND OTHER NONSPECIFIC SKIN ERUPTION 01/17/2009  . TINNITUS NOS 10/18/2008  . B12 deficiency 08/10/2008  . LOW BACK PAIN 06/23/2008  . CHEST WALL PAIN 06/23/2008  . DYSURIA 03/18/2008  . Abdominal pain 03/18/2008  . Hyperlipidemia 12/24/2007  . DYSPNEA 12/24/2007  . Anxiety disorder 12/07/2007  . GERD 12/07/2007  . CHEST PAIN 12/07/2007  . OTHER SPEC FORMS CHRONIC ISCHEMIC HEART DISEASE 12/01/2007  . Type 2 diabetes mellitus with diabetic nephropathy, without long-term current use of insulin (HCC) 11/28/2007  . Pain in joint 11/28/2007  . Gout 09/17/2007  . Adjustment disorder with mixed anxiety and depressed mood 09/17/2007  . Coronary atherosclerosis 09/17/2007  . Disorder resulting from impaired renal function 09/17/2007  . OSTEOPOROSIS 09/17/2007  . DVT, HX OF 09/17/2007  . PANCREATITIS, HX OF 09/17/2007    Orientation RESPIRATION BLADDER Height & Weight     Self, Time, Situation, Place  Normal Incontinent Weight: 167 lb 12.3 oz (76.1 kg) Height:  5\' 6"  (167.6 cm)  BEHAVIORAL SYMPTOMS/MOOD NEUROLOGICAL BOWEL NUTRITION STATUS      Continent Diet(Please see DC Summary)  AMBULATORY STATUS COMMUNICATION OF NEEDS Skin   Extensive Assist  Verbally Normal                       Personal Care Assistance Level of Assistance  Bathing, Feeding, Dressing Bathing Assistance: Limited assistance Feeding assistance: Limited assistance Dressing Assistance: Limited assistance     Functional Limitations Info  Sight, Hearing, Speech Sight Info: Adequate Hearing Info: Adequate Speech Info: Adequate    SPECIAL CARE FACTORS FREQUENCY  PT (By licensed PT), OT (By licensed OT)     PT Frequency: 5x OT Frequency: 5x            Contractures      Additional Factors Info  Isolation Precautions Code Status Info: Full Allergies Info: Allergies: Ativan (Lorazepam), Tramadol Hcl, Amlodipine Besylate, Atenolol, Benazepril, Benicar (Olmesartan Medoxomil), Cozaar, Hydralazine, Hydrochlorothiazide W-triamterene, Hydrocodone, Hydroxyzine Pamoate, Iodine, Lisinopril, Peach Flavor, Penicillins, Pravastatin, Prednisolone, Strawberry Flavor, Tramadol Hcl, Benadryl (Diphenhydramine)     Isolation Precautions Info: Covid positive     Current Medications (02/08/2020):  This is the current hospital active medication list Current Facility-Administered Medications  Medication Dose Route Frequency Provider Last Rate Last Admin  . acetaminophen (TYLENOL) tablet 650 mg  650 mg Oral Q6H PRN Wynetta Fines T, MD      . albuterol (VENTOLIN HFA) 108 (90 Base) MCG/ACT inhaler 2 puff  2 puff Inhalation Q4H PRN Lequita Halt, MD   2 puff at 02/06/20 0354  . amLODipine (NORVASC) tablet 10 mg  10 mg Oral Daily Thurnell Lose, MD   10 mg at 02/08/20 0915  . aspirin EC tablet 81 mg  81 mg Oral Daily Wynetta Fines T, MD   81 mg at 02/08/20 0914  . brimonidine (ALPHAGAN) 0.2 % ophthalmic solution 1 drop  1 drop Both Eyes BID Wynetta Fines T, MD   1 drop at 02/08/20 0934  . carvedilol (COREG) tablet 25 mg  25 mg Oral BID WC Lequita Halt, MD   25 mg at 02/08/20 0914  . cefTRIAXone (ROCEPHIN) 1 g in sodium chloride 0.9 % 100 mL IVPB  1 g Intravenous Q24H Lala Lund K, MD 200 mL/hr at 02/07/20 1604 1 g at 02/07/20 1604  . Chlorhexidine Gluconate Cloth 2 % PADS 6 each  6 each Topical Daily Thurnell Lose, MD   6 each at 02/07/20 [phone]  . cholecalciferol (VITAMIN D3) tablet 2,000 Units  2,000 Units Oral Daily Lequita Halt, MD   2,000 Units at 02/08/20 0914  . clopidogrel (PLAVIX) tablet 75 mg  75 mg Oral Daily Wynetta Fines T, MD   75 mg at 02/08/20 0914  . cyclobenzaprine (FLEXERIL) tablet 5 mg  5 mg Oral QHS PRN Wynetta Fines T, MD      . dexamethasone (DECADRON) injection 6 mg  6 mg Intravenous Q24H Thurnell Lose, MD   6 mg at 02/07/20 1301  . DULoxetine (CYMBALTA) DR capsule 20 mg  20 mg Oral Daily Wynetta Fines T, MD   20 mg at 02/08/20 0913  . feeding supplement (GLUCERNA SHAKE) (GLUCERNA SHAKE) liquid 237 mL  237 mL Oral TID BM Thurnell Lose, MD   237 mL at 02/08/20 0934  . guaiFENesin-dextromethorphan (ROBITUSSIN DM) 100-10 MG/5ML syrup 10 mL  10 mL Oral Q4H  PRN Mikey College T, MD   10 mL at 02/05/20 1821  . heparin injection 5,000 Units  5,000 Units Subcutaneous Q8H Leroy Sea, MD   5,000 Units at 02/08/20 0532  . insulin aspart (novoLOG) injection 0-9 Units  0-9 Units Subcutaneous TID WC Emeline General, MD   7 Units at 02/08/20 1255  . lactated ringers infusion   Intravenous Continuous Leroy Sea, MD 100 mL/hr at 02/08/20 1102 New Bag at 02/08/20 1102  . levETIRAcetam (KEPPRA) tablet 500 mg  500 mg Oral BID Mikey College T, MD   500 mg at 02/08/20 0915  . ondansetron (ZOFRAN) injection 4 mg  4 mg Intravenous Q6H PRN Mikey College T, MD      . remdesivir 100 mg in sodium chloride 0.9 % 100 mL IVPB  100 mg Intravenous Daily Vicente Serene, RPH 200 mL/hr at 02/08/20 0933 100 mg at 02/08/20 0933  . rosuvastatin (CRESTOR) tablet 20 mg  20 mg Oral Daily Mikey College T, MD   20 mg at 02/07/20 2159  . senna-docusate (Senokot-S) tablet 1 tablet  1 tablet Oral QHS PRN Mikey College T, MD      . vitamin B-12 (CYANOCOBALAMIN) tablet 1,000 mcg   1,000 mcg Oral Daily Emeline General, MD   1,000 mcg at 02/08/20 9798     Discharge Medications: Please see discharge summary for a list of discharge medications.  Relevant Imaging Results:  Relevant Lab Results:   Additional Information   SS# [id number]  Erin Sons, LCSW

## 2020-02-08 NOTE — Progress Notes (Signed)
Physical Therapy Treatment Patient Details Name: [name] MRN: [id number] DOB: 1933-05-23 Today's Date: 02/08/2020    History of Present Illness 86 year old. female with medical history significant of CAD, HTN, HLD, Seizure presented with increasing fatigue, dry cough and increasing SOB for 8 days. Pt had her 2nd COVID shot on 03/16. She has been feeling fine until about 8 days ago, she started to have dry cough and congestion, no chest pain, subjective fever but no chills, and gradually she feels tired and sleepy, poor appetite, and loose diarrhea x2 yesterday. In ED foud to be COVID positive, X-ray B/L infiltrates, BMP showed Cre 1.98. Admitted 02/05/20 for treatment     PT Comments    Patient able to stand from EOB with maxA x 1 with RW briefly but required two person assist for transfer bed>chair with bed height increased and cues and assist for foot placement to ensure COM over BOS. Patient reporting nausea upon transfer to chair. BP WNL. Nurse informed. Continued recommendation for skilled PT services and discharge to SNF for short term rehabiliation.   Follow Up Recommendations  SNF     Equipment Recommendations  Rolling walker with 5" wheels;3in1 (PT)       Precautions / Restrictions Precautions Precautions: Fall;Other (comment) Precaution Comments: LUE PICC Restrictions Weight Bearing Restrictions: No    Mobility  Bed Mobility Overal bed mobility: Needs Assistance Bed Mobility: Supine to Sit     Supine to sit: Min assist;HOB elevated;Mod assist     General bed mobility comments: HOB elevated approx 60 degrees, use of bedrail  Transfers Overall transfer level: Needs assistance Equipment used: Rolling walker (2 wheeled) Transfers: Sit to/from [employer] Transfers Sit to Stand: Max assist;From elevated surface(1-2 assist) Stand pivot transfers: Max assist;+2 physical assistance;+2 safety/equipment;From elevated surface       General transfer comment:  Sit<>partial stand from EOB x 2 trials with maxA, bed height increased further and patient maxA to stand with RW but decreased standing tolerance and sat back down on EOB. 2nd person (3rd person also present but did not always assist) for stand-step transfer EOB>chair with use of RW.  Ambulation/Gait   General Gait Details: Patient would be unable to tolerate gait at this time.     Balance Needs assistance Sitting balance EOB with close supervision. Patient tends to lean to R or forward on forearms/elbows with fatigue.  Standing balance with RW and min/modA.   Cognition Arousal/Alertness: Awake/alert     Exercises General Exercises - Lower Extremity Ankle Circles/Pumps: AROM;10 reps;Both;Supine    General Comments General comments (skin integrity, edema, etc.): Patient on room air. Nauseous after transfer to chair. BP 121/74, HR in the 70sbpm, O2 sat 97%. Nurse informed.      Pertinent Vitals/Pain Pain Assessment: 0-10 Pain Score: 5  Pain Location: all over arthritis pain Pain Descriptors / Indicators: Other (Comment)(stiffness) Pain Intervention(s): Repositioned;Monitored during session;Limited activity within patient's tolerance           PT Goals (current goals can now be found in the care plan section) Progress towards PT goals: Progressing toward goals    Frequency    Min 2X/week      PT Plan Current plan remains appropriate       AM-PAC PT "6 Clicks" Mobility   Outcome Measure  Help needed turning from your back to your side while in a flat bed without using bedrails?: A Little Help needed moving from lying on your back to sitting on the side of  a flat bed without using bedrails?: A Lot Help needed moving to and from a bed to a chair (including a wheelchair)?: Total Help needed standing up from a chair using your arms (e.g., wheelchair or bedside chair)?: Total Help needed to walk in hospital room?: Total Help needed climbing 3-5 steps with a railing? :  Total 6 Click Score: 9    End of Session Equipment Utilized During Treatment: Gait belt Activity Tolerance: Patient limited by pain;Patient limited by fatigue Patient left: in chair;with call bell/phone within reach;with chair alarm set Nurse Communication: Mobility status;Other (comment)(nausea) PT Visit Diagnosis: Unsteadiness on feet (R26.81);Other abnormalities of gait and mobility (R26.89);Muscle weakness (generalized) (M62.81);Difficulty in walking, not elsewhere classified (R26.2)     Time: 6812-7517 PT Time Calculation (min) (ACUTE ONLY): 36 min  Charges:  $Therapeutic Activity: 23-37 mins                     Birdie Hopes, PT, DPT Acute Rehab [phone] office     Birdie Hopes 02/08/2020, 3:08 PM

## 2020-02-08 NOTE — Telephone Encounter (Signed)
    Darlene from [facility] requesting clarification on orders received. Additional information needed/ reason for referral Please call [phone]

## 2020-02-08 NOTE — TOC Initial Note (Signed)
Transition of Care [facility]) - Initial/Assessment Note    Patient Details  Name: [name] MRN: [id number] Date of Birth: [birth year]  Transition of Care [facility]) CM/SW Contact:    Mearl Latin, LCSW Phone Number: 02/08/2020, 1:09 PM  Clinical Narrative:                 CSW received consult for possible SNF placement at time of discharge. CSW spoke with patient regarding PT recommendation of SNF placement at time of discharge. Patient reported that patient's spouse is currently unable to care for patient at their home given patient's current physical needs and fall risk. Patient expressed understanding of PT recommendation and is agreeable to SNF placement at time of discharge. She requested CSW speak with her daughter, [name], to discuss SNF. [name] states that patient's spouse is going to [facility] and reports preference that patient also go to [facility]. CSW discussed insurance authorization process and provided Medicare SNF ratings list. Patient expressed being hopeful for rehab and to feel better soon. No further questions reported at this time. [facility] able to accept patient and will begin insurance authorization with [employer]. CSW to continue to follow and assist with discharge planning needs.   Expected Discharge Plan: Skilled Nursing Facility Barriers to Discharge: Continued Medical Work up   Patient Goals and CMS Choice Patient states their goals for this hospitalization and ongoing recovery are:: Rehab CMS Medicare.gov Compare Post Acute Care list provided to:: Patient Choice offered to / list presented to : Patient, Adult Children(Daughter [name])  Expected Discharge Plan and Services Expected Discharge Plan: Skilled Nursing Facility In-house Referral: Clinical Social Work   Post Acute Care Choice: Skilled Nursing Facility Living arrangements for the past 2 months: Single Family Home                                      Prior Living Arrangements/Services Living  arrangements for the past 2 months: Single Family Home Lives with:: Spouse Patient language and need for interpreter reviewed:: Yes Do you feel safe going back to the place where you live?: Yes      Need for Family Participation in Patient Care: Yes (Comment) Care giver support system in place?: Yes (comment)   Criminal Activity/Legal Involvement Pertinent to Current Situation/Hospitalization: No - Comment as needed  Activities of Daily Living   ADL Screening (condition at time of admission) Patient's cognitive ability adequate to safely complete daily activities?: Yes Is the patient deaf or have difficulty hearing?: No Does the patient have difficulty seeing, even when wearing glasses/contacts?: No Patient able to express need for assistance with ADLs?: Yes Independently performs ADLs?: Yes (appropriate for developmental age)  Permission Sought/Granted Permission sought to share information with : Facility Medical sales representative, Family Supports Permission granted to share information with : Yes, Verbal Permission Granted  Share Information with NAME: [name]  Permission granted to share info w AGENCY: SNFs  Permission granted to share info w Relationship: [phone]  Permission granted to share info w Contact Information: [phone]  Emotional Assessment Appearance:: Appears stated age Attitude/Demeanor/Rapport: Engaged Affect (typically observed): Accepting, Appropriate Orientation: : Oriented to Self, Oriented to Place, Oriented to  Time, Oriented to Situation Alcohol / Substance Use: Not Applicable Psych Involvement: No (comment)  Admission diagnosis:  Shortness of breath [R06.02] Cough [R05] Weakness [R53.1] AKI (acute kidney injury) (HCC) [N17.9] Fever, unspecified fever cause [R50.9] COVID-19 [U07.1] Patient Active Problem List  Diagnosis Date Noted  . COVID-19 virus infection 02/05/2020  . COVID-19 02/05/2020  . Unstable angina ([place]) 10/16/2019  . Non-ST  elevation (NSTEMI) myocardial infarction ([place]) 10/16/2019  . Nausea 06/23/2018  . CHF (congestive heart failure) ([place]) 04/03/2018  . AKI (acute kidney injury) ([place]) 11/20/2017  . Dehydration   . Generalized weakness 11/18/2017  . Second degree burn of foot 11/18/2017  . Seizures ([place]) 07/17/2016  . Hypertensive urgency 07/17/2016  . Acute diastolic CHF (congestive heart failure) ([place]) 07/17/2016  . UTI (urinary tract infection) 02/28/2016  . Urinary incontinence 10/28/2015  . Alopecia 08/12/2015  . TIA (transient ischemic attack) 07/01/2015  . Acute encephalopathy 06/24/2015  . Protein-calorie malnutrition, severe ([place]) 04/19/2015  . Fever   . Abdominal abscess   . Pressure ulcer 04/07/2015  . Acute kidney injury ([place])   . Sepsis ([place])   . Stroke ([place])   . Facial droop   . Confusion 03/26/2015  . Malnutrition of moderate degree ([place]) 03/20/2015  . Small bowel obstruction s/p exlap/LOA/decompression 03/25/2015 03/18/2015  . Essential hypertension 03/18/2015  . Noncompliance with medication regimen 12/07/2014  . Fall against object 06/24/2014  . Contusion of left hand 06/24/2014  . Contusion of left hip 06/24/2014  . Loss of weight 04/12/2014  . Malignant hypertension with renal failure and congestive heart failure ([place]) 09/23/2013  . Knee pain, bilateral 07/27/2013  . Chronic fatigue disorder 01/19/2013  . Vaginitis due to Candida 09/02/2012  . Sinusitis 09/02/2012  . Anemia in other chronic diseases classified elsewhere 09/02/2012  . Polymyalgia rheumatica ([place]) 09/10/2011  . Vertigo 08/21/2011  . Fatigue 05/11/2011  . ANEMIA OF CHRONIC DISEASE 09/06/2010  . KNEE PAIN, CHRONIC 05/26/2010  . DIZZINESS 05/11/2010  . SHOULDER PAIN 04/25/2010  . FREQUENCY, URINARY 03/16/2010  . CONSTIPATION, CHRONIC 01/17/2010  . FOOT PAIN 01/17/2009  . RASH AND OTHER NONSPECIFIC SKIN ERUPTION 01/17/2009  . TINNITUS NOS 10/18/2008  . B12 deficiency 08/10/2008  . LOW BACK PAIN 06/23/2008   . CHEST WALL PAIN 06/23/2008  . DYSURIA 03/18/2008  . Abdominal pain 03/18/2008  . Hyperlipidemia 12/24/2007  . DYSPNEA 12/24/2007  . Anxiety disorder 12/07/2007  . GERD 12/07/2007  . CHEST PAIN 12/07/2007  . OTHER SPEC FORMS CHRONIC ISCHEMIC HEART DISEASE 12/01/2007  . Type 2 diabetes mellitus with diabetic nephropathy, without long-term current use of insulin ([place]) 11/28/2007  . Pain in joint 11/28/2007  . Gout 09/17/2007  . Adjustment disorder with mixed anxiety and depressed mood 09/17/2007  . Coronary atherosclerosis 09/17/2007  . Disorder resulting from impaired renal function 09/17/2007  . OSTEOPOROSIS 09/17/2007  . DVT, HX OF 09/17/2007  . PANCREATITIS, HX OF 09/17/2007   PCP:  Cassandria Anger, MD Pharmacy:   [facility], [place] [address] Phone: [phone] Fax: [phone]  [facility], [place] [address] Phone: [phone] Fax: [phone]     Social Determinants of Health (SDOH) Interventions    Readmission Risk Interventions Readmission Risk Prevention Plan 02/08/2020  Transportation Screening Complete  Medication Review ([place]) Complete  PCP or Specialist appointment within 3-5 days of discharge Complete  HRI or Home Care Consult Complete  SW Recovery Care/Counseling Consult Complete  Palliative Care Screening Not [place] Complete  Some recent data might be hidden

## 2020-02-08 NOTE — Care Management Important Message (Signed)
Important Message  Patient Details  Name: [name] MRN: [id number] Date of Birth: 04/03/1933   Medicare Important Message Given:  Yes Tried calling no anwser     Dorena Bodo 02/08/2020, 3:59 PM

## 2020-02-09 LAB — GLUCOSE, CAPILLARY
Glucose-Capillary: 246 mg/dL — ABNORMAL HIGH (ref 70–99)
Glucose-Capillary: 292 mg/dL — ABNORMAL HIGH (ref 70–99)
Glucose-Capillary: 244 mg/dL — ABNORMAL HIGH (ref 70–99)
Glucose-Capillary: 284 mg/dL — ABNORMAL HIGH (ref 70–99)

## 2020-02-09 LAB — COMPREHENSIVE METABOLIC PANEL
ALT: 21 U/L (ref 0–44)
AST: 29 U/L (ref 15–41)
Albumin: 2.4 g/dL — ABNORMAL LOW (ref 3.5–5.0)
Alkaline Phosphatase: 48 U/L (ref 38–126)
Anion gap: 11 (ref 5–15)
BUN: 52 mg/dL — ABNORMAL HIGH (ref 8–23)
CO2: 23 mmol/L (ref 22–32)
Calcium: 8.9 mg/dL (ref 8.9–10.3)
Chloride: 102 mmol/L (ref 98–111)
Creatinine, Ser: 1.46 mg/dL — ABNORMAL HIGH (ref 0.44–1.00)
GFR calc Af Amer: 38 mL/min — ABNORMAL LOW (ref >60)
GFR calc non Af Amer: 33 mL/min — ABNORMAL LOW (ref >60)
Glucose, Bld: 280 mg/dL — ABNORMAL HIGH (ref 70–99)
Potassium: 4.5 mmol/L (ref 3.5–5.1)
Sodium: 136 mmol/L (ref 135–145)
Total Bilirubin: 0.8 mg/dL (ref 0.3–1.2)
Total Protein: 6.6 g/dL (ref 6.5–8.1)

## 2020-02-09 LAB — MAGNESIUM: Magnesium: 1.5 mg/dL — ABNORMAL LOW (ref 1.7–2.4)

## 2020-02-09 LAB — CBC WITH DIFFERENTIAL/PLATELET
Abs Immature Granulocytes: 0.13 10*3/uL — ABNORMAL HIGH (ref 0.00–0.07)
Basophils Absolute: 0 10*3/uL (ref 0.0–0.1)
Basophils Relative: 0 %
Eosinophils Absolute: 0 10*3/uL (ref 0.0–0.5)
Eosinophils Relative: 0 %
HCT: 29.1 % — ABNORMAL LOW (ref 36.0–46.0)
Hemoglobin: 9.3 g/dL — ABNORMAL LOW (ref 12.0–15.0)
Immature Granulocytes: 2 %
Lymphocytes Relative: 8 %
Lymphs Abs: 0.6 10*3/uL — ABNORMAL LOW (ref 0.7–4.0)
MCH: 29.6 pg (ref 26.0–34.0)
MCHC: 32 g/dL (ref 30.0–36.0)
MCV: 92.7 fL (ref 80.0–100.0)
Monocytes Absolute: 0.3 10*3/uL (ref 0.1–1.0)
Monocytes Relative: 4 %
Neutro Abs: 6.6 10*3/uL (ref 1.7–7.7)
Neutrophils Relative %: 86 %
Platelets: 314 10*3/uL (ref 150–400)
RBC: 3.14 MIL/uL — ABNORMAL LOW (ref 3.87–5.11)
RDW: 13.2 % (ref 11.5–15.5)
WBC: 7.6 10*3/uL (ref 4.0–10.5)
nRBC: 0 % (ref 0.0–0.2)

## 2020-02-09 LAB — BRAIN NATRIURETIC PEPTIDE: B Natriuretic Peptide: 212.1 pg/mL — ABNORMAL HIGH (ref 0.0–100.0)

## 2020-02-09 LAB — D-DIMER, QUANTITATIVE: D-Dimer, Quant: 2.08 ug/mL-FEU — ABNORMAL HIGH (ref 0.00–0.50)

## 2020-02-09 LAB — C-REACTIVE PROTEIN: CRP: 7.3 mg/dL — ABNORMAL HIGH (ref <1.0)

## 2020-02-09 LAB — PROCALCITONIN: Procalcitonin: <0.1 ng/mL

## 2020-02-09 MED ORDER — GLUCERNA SHAKE PO LIQD: 237.0000 mL | Freq: Three times a day (TID) | ORAL | 0 refills | Status: AC

## 2020-02-09 MED ORDER — MAGNESIUM SULFATE 2 GM/50ML IV SOLN
2.0000 g | Freq: Once | INTRAVENOUS | Status: AC
  Administered 2020-02-09: 2 g via INTRAVENOUS
  Filled 2020-02-09: qty 50

## 2020-02-09 MED ORDER — GUAIFENESIN-DM 100-10 MG/5ML PO SYRP: 10.0000 mL | ORAL_SOLUTION | ORAL | 0 refills | Status: DC | PRN

## 2020-02-09 MED ORDER — AMLODIPINE BESYLATE 10 MG PO TABS: 10.0000 mg | ORAL_TABLET | Freq: Every day | ORAL | Status: DC

## 2020-02-09 MED ORDER — PANTOPRAZOLE SODIUM 40 MG PO TBEC: 40.0000 mg | DELAYED_RELEASE_TABLET | Freq: Every day | ORAL | 0 refills | Status: AC

## 2020-02-09 MED ORDER — PREDNISONE 5 MG PO TABS: ORAL_TABLET | ORAL | 0 refills | Status: DC

## 2020-02-09 NOTE — Progress Notes (Signed)
Inpatient Diabetes Program Recommendations  AACE/ADA: New Consensus Statement on Inpatient Glycemic Control (2015)  Target Ranges:  Prepandial:   less than 140 mg/dL      Peak postprandial:   less than 180 mg/dL (1-2 hours)      Critically ill patients:  140 - 180 mg/dL   Lab Results  Component Value Date   GLUCAP 292 (H) 02/09/2020   HGBA1C 6.6 (H) 10/16/2019    Review of Glycemic Control Results for [name], [name] (MRN [id number]) as of 02/09/2020 12:42  Ref. Range 02/08/2020 12:23 02/08/2020 16:30 02/08/2020 21:28 02/09/2020 08:19 02/09/2020 11:26  Glucose-Capillary Latest Ref Range: 70 - 99 mg/dL 564 (H) 332 (H) 951 (H) 246 (H) 292 (H)   Diabetes history: DM2 Outpatient Diabetes medications: None Current orders for Inpatient glycemic control: Novolog sensitive correction tid  Inpatient Diabetes Program Recommendations:   CBGs range 246-312 over the past 24 hrs. While on steroids: -Levemir 7 units qd (0.1 unit/kg x 76.1 kg)  Thank you, Darel Hong E. Beatrice Ziehm, RN, MSN, CDE  Diabetes Coordinator Inpatient Glycemic Control Team Team Pager [phone] (8am-5pm) 02/09/2020 12:44 PM

## 2020-02-09 NOTE — Consult Note (Signed)
   [facility] CM Inpatient Consult   02/09/2020  [name] August 16, 1933 [id number]   Patient screened for high risk score for unplanned readmission score and to check for potential [facility] [place] Management service needs.    Review of patient's medical record from MD history and physical notes of 02/05/20 which includes but not limited to and reveals patient is:  [name] is a 86 y.o. female with medical history significant of CAD, HTN, HLD, Seizure presented with increasing fatigue, dry cough and increasing SOB for 8 days. Pt had her 2nd COVID shot on 03/16. She has been feeling fine until about 8 days ago, she started to have dry cough and congestion, no chest pain, subjective fever but no chills, and gradually she feels tired and sleepy, poor appetite, and loose diarrhea x2.  Patient is COVID-19 positive 02/05/20.  Primary Care Provider is: Plotnikov, Evie Lacks, MD, this office provides the transition of care.  Plan:  Patient is currently awaiting [place] [SNF] approval. If patient goes to SNF, her transition of care needs will be met at that level of care.  Will sign off at disposition.  Continue to follow progress and disposition to assess for post hospital care management needs.    Please place a [facility] Care Management consult as appropriate and for questions contact:   Natividad Brood, RN BSN [facility] Liaison  [phone] business mobile phone Toll free office [phone]  Fax number: [phone] [email or web address]

## 2020-02-09 NOTE — Progress Notes (Signed)
PROGRESS NOTE                                                                                                                                                                                                             Patient Demographics:    [name], is a 86 y.o. female, DOB - Jun 03, 1933, DIY:[id number]  Outpatient Primary MD for the patient is Plotnikov, Evie Lacks, MD    LOS - 4  Admit date - 02/05/2020    Chief Complaint  Patient presents with  . Weakness  . Fever       Brief Narrative  - 86 y.o. female with medical history significant of CAD, HTN, HLD, Seizure presented with increasing fatigue, dry cough and increasing SOB for 8 days. Pt had her 2nd COVID shot on 03/16. She has been feeling fine until about 8 days ago, she started to have dry cough and congestion, no chest pain, subjective fever but no chills, and gradually she feels tired and sleepy, poor appetite, and loose diarrhea x 2 yesterday. She started complaining of some weakness and nausea came to the ER after which she was diagnosed with COVID-19 pneumonia and admitted to the hospital.   Subjective:   Patient in bed, appears comfortable, denies any headache, no fever, no chest pain or pressure, no shortness of breath , no abdominal pain. No focal weakness.    Assessment  & Plan :     1. Acute Covid 19 Viral Pneumonitis during the ongoing 2020 Covid 19 Pandemic - she seems to have moderate disease has been started on steroids and remdesivir, since her CRP is high we will switch her to IV steroids instead of oral, currently not hypoxic will be monitored closely. Clinically seems to have stabilized and gradually improving, monitor closely.  Encouraged the patient to sit up in chair in the daytime use I-S and flutter valve for pulmonary toiletry and then prone in bed when at night.     Recent Labs  Lab 02/06/20 0318 02/07/20 0343 02/07/20 0413  02/08/20 0315 02/09/20 0425 02/09/20 0435  CRP 20.1* 14.1*  --  9.9*  --  7.3*  DDIMER 2.77* 2.06*  --  2.01*  --  2.08*  BNP  --   --  576.8* 240.2* 212.1*  --   PROCALCITON 0.30 0.14  --  0.13  --  <  0.10    Hepatic Function Latest Ref Rng & Units 02/09/2020 02/08/2020 02/07/2020  Total Protein 6.5 - 8.1 g/dL 6.6 7.1 7.0  Albumin 3.5 - 5.0 g/dL 2.4(L) 2.4(L) 2.3(L)  AST 15 - 41 U/L 29 35 39  ALT 0 - 44 U/L 21 22 24   Alk Phosphatase 38 - 126 U/L 48 50 48  Total Bilirubin 0.3 - 1.2 mg/dL 0.8 0.6 0.9  Bilirubin, Direct 0.0 - 0.3 mg/dL - - -     2.  Dehydration with AKI - had improved after IV fluids, slightly worse on 02/08/2020 again, gently hydrate with IV fluids for another 10 hours.  3.  History of seizures.  Stable on Keppra.  4.  Weakness and deconditioning.  PT OT and supportive care.  5.  Anion gap metabolic acidosis.  From diarrhea.  Supportive care and monitor.  6.  UTI.  Currently on Rocephin will give 3 doses.  Follow cultures.  7.  Essential hypertension.  On Coreg, multiple allergies, started on Norvasc(mild allergy of dizziness)  Remains stable.   Condition - Extremely Guarded  Family Communication  :  Daughter [name] [phone] - 02/06/20, 02/07/20, 02/08/20, 02/09/20  Code Status :  Full  Diet :   Diet Order            Diet Heart Room service appropriate? Yes; Fluid consistency: Thin  Diet effective now               Disposition Plan  :  Stay in the Hospital to complete Covid treatment, SNF 02/10/20 after 11 am  Consults  : None  Procedures  :    PUD Prophylaxis : None  DVT Prophylaxis  :    Heparin    Lab Results  Component Value Date   PLT 314 02/09/2020    Inpatient Medications  Scheduled Meds: . amLODipine  10 mg Oral Daily  . aspirin EC  81 mg Oral Daily  . brimonidine  1 drop Both Eyes BID  . carvedilol  25 mg Oral BID WC  . Chlorhexidine Gluconate Cloth  6 each Topical Daily  . cholecalciferol  2,000 Units Oral Daily  .  clopidogrel  75 mg Oral Daily  . dexamethasone (DECADRON) injection  6 mg Intravenous Q24H  . DULoxetine  20 mg Oral Daily  . feeding supplement (GLUCERNA SHAKE)  237 mL Oral TID BM  . heparin  5,000 Units Subcutaneous Q8H  . insulin aspart  0-9 Units Subcutaneous TID WC  . levETIRAcetam  500 mg Oral BID  . rosuvastatin  20 mg Oral Daily  . vitamin B-12  1,000 mcg Oral Daily   Continuous Infusions: . magnesium sulfate bolus IVPB 2 g (02/09/20 0925)  . remdesivir 100 mg in NS 100 mL 100 mg (02/08/20 0933)   PRN Meds:.acetaminophen, albuterol, cyclobenzaprine, guaiFENesin-dextromethorphan, [DISCONTINUED] ondansetron **OR** ondansetron (ZOFRAN) IV, senna-docusate  Antibiotics  :    Anti-infectives (From admission, onward)   Start     Dose/Rate Route Frequency Ordered Stop   02/07/20 1000  remdesivir 100 mg in sodium chloride 0.9 % 100 mL IVPB     100 mg 200 mL/hr over 30 Minutes Intravenous Daily 02/06/20 1049 02/11/20 0959   02/06/20 1000  remdesivir 100 mg in sodium chloride 0.9 % 100 mL IVPB  Status:  Discontinued     100 mg 200 mL/hr over 30 Minutes Intravenous Daily 02/05/20 1525 02/06/20 1049   02/05/20 1815  cefTRIAXone (ROCEPHIN) injection 1 g     1 g  Intramuscular  Once 02/05/20 1800 02/05/20 2113   02/05/20 1545  cefTRIAXone (ROCEPHIN) 1 g in sodium chloride 0.9 % 100 mL IVPB     1 g 200 mL/hr over 30 Minutes Intravenous Every 24 hours 02/05/20 1533 02/08/20 1544   02/05/20 1530  remdesivir 200 mg in sodium chloride 0.9% 250 mL IVPB     200 mg 580 mL/hr over 30 Minutes Intravenous Once 02/05/20 1525 02/06/20 1048       Time Spent in minutes  30   Lala Lund M.D on 02/09/2020 at 9:47 AM  To page go to [email or web address] - password [facility]  [facility] -  Office  [phone]    See all Orders from today for further details    Objective:   Vitals:   02/08/20 1600 02/08/20 2126 02/09/20 0528 02/09/20 0744  BP: 134/63 (!) 145/67 111/68   Pulse: 74 71 66 71    Resp: 18 13 14 13   Temp:  98.5 F (36.9 C) 98.5 F (36.9 C)   TempSrc:  Oral Oral   SpO2: 98% 97% 96% 99%  Weight:      Height:        Wt Readings from Last 3 Encounters:  02/07/20 76.1 kg  01/29/20 80.9 kg  11/02/19 76.7 kg     Intake/Output Summary (Last 24 hours) at 02/09/2020 0947 Last data filed at 02/08/2020 1807 Gross per 24 hour  Intake 905.24 ml  Output --  Net 905.24 ml     Physical Exam  Awake Alert, No new F.N deficits, Normal affect [place].AT,PERRAL Supple Neck,No JVD, No cervical lymphadenopathy appriciated.  Symmetrical Chest wall movement, Good air movement bilaterally, CTAB RRR,No Gallops, Rubs or new Murmurs, No Parasternal Heave +ve B.Sounds, Abd Soft, No tenderness, No organomegaly appriciated, No rebound - guarding or rigidity. No Cyanosis, Clubbing or edema, No new Rash or bruise    Data Review:    CBC Recent Labs  Lab 02/05/20 1037 02/06/20 0318 02/07/20 0343 02/08/20 0315 02/09/20 0435  WBC 9.9 5.6 6.3 7.5 7.6  HGB 10.2* 9.7* 9.2* 9.5* 9.3*  HCT 33.5* 31.3* 29.0* 29.7* 29.1*  PLT 231 226 260 290 314  MCV 96.5 94.6 93.2 91.4 92.7  MCH 29.4 29.3 29.6 29.2 29.6  MCHC 30.4 31.0 31.7 32.0 32.0  RDW 13.0 13.2 13.1 13.1 13.2  LYMPHSABS 0.8 0.7 0.8 0.6* 0.6*  MONOABS 0.5 0.1 0.3 0.3 0.3  EOSABS 0.1 0.0 0.0 0.0 0.0  BASOSABS 0.0 0.0 0.0 0.0 0.0    Chemistries  Recent Labs  Lab 02/05/20 1037 02/06/20 0318 02/07/20 0343 02/08/20 0315 02/09/20 0435  NA 141 139 138 137 136  K 3.7 4.0 3.9 4.0 4.5  CL 105 105 106 102 102  CO2 19* 18* 21* 23 23  GLUCOSE 98 180* 180* 328* 280*  BUN 43* 38* 38* 51* 52*  CREATININE 1.98* 1.53* 1.19* 1.49* 1.46*  CALCIUM 9.1 8.8* 8.7* 8.8* 8.9  AST 80* 56* 39 35 29  ALT 31 25 24 22 21   ALKPHOS 61 54 48 50 48  BILITOT 1.3* 1.2 0.9 0.6 0.8  MG  --  1.7 1.7 1.6* 1.5*     ------------------------------------------------------------------------------------------------------------------ No results for  input(s): CHOL, HDL, LDLCALC, TRIG, CHOLHDL, LDLDIRECT in the last 72 hours.  Lab Results  Component Value Date   HGBA1C 6.6 (H) 10/16/2019   ------------------------------------------------------------------------------------------------------------------ No results for input(s): TSH, T4TOTAL, T3FREE, THYROIDAB in the last 72 hours.  Invalid input(s): FREET3  Cardiac Enzymes No results for input(s):  CKMB, TROPONINI, MYOGLOBIN in the last 168 hours.  Invalid input(s): CK ------------------------------------------------------------------------------------------------------------------  Micro Results Recent Results (from the past 240 hour(s))  Blood culture (routine x 2)     Status: None (Preliminary result)   Collection Time: 02/05/20 10:37 AM   Specimen: BLOOD  Result Value Ref Range Status   Specimen Description BLOOD BLOOD RIGHT HAND  Final   Special Requests   Final    AEROBIC BOTTLE ONLY Blood Culture results may not be optimal due to an inadequate volume of blood received in culture bottles Performed at [facility] [address] [place], [place] [address]    Culture NO GROWTH 3 DAYS  Final   Report Status PENDING  Incomplete  Blood culture (routine x 2)     Status: None (Preliminary result)   Collection Time: 02/05/20  1:43 PM   Specimen: BLOOD  Result Value Ref Range Status   Specimen Description BLOOD BLOOD LEFT HAND  Final   Special Requests   Final    BOTTLES DRAWN AEROBIC AND ANAEROBIC Blood Culture adequate volume Performed at [facility], [place] [address] [place], [place] [address]    Culture NO GROWTH 3 DAYS  Final   Report Status PENDING  Incomplete  Urine culture     Status: None   Collection Time: 02/05/20  2:59 PM   Specimen: Urine, Random  Result Value Ref Range Status   Specimen Description URINE, RANDOM  Final   Special Requests NONE  Final   Culture   Final    NO GROWTH Performed at [facility], [place] [address],  [place], [place] [address]    Report Status 02/06/2020 FINAL  Final    Radiology Reports DG Chest Portable 1 View  Result Date: 02/05/2020 CLINICAL DATA:  Fever and chills with cough. EXAM: PORTABLE CHEST 1 VIEW COMPARISON:  10/16/2019 FINDINGS: Asymmetric infiltrate in the left more than right lung. Cardiomegaly and vascular pedicle widening. No visible effusion or pneumothorax. IMPRESSION: Asymmetric airspace disease, consistent with pneumonia in this setting. Electronically Signed   By: Monte Fantasia M.D.   On: 02/05/2020 10:22   [place] EKG SITE RITE  Result Date: 02/05/2020 If Site Rite image not attached, placement could not be confirmed due to current cardiac rhythm.

## 2020-02-09 NOTE — TOC Progression Note (Signed)
Transition of Care [facility]) - Progression Note    Patient Details  Name: [name] MRN: [id number] Date of Birth: [birth year]  Transition of Care [facility]) CM/SW Contact  Mearl Latin, LCSW Phone Number: 02/09/2020, 9:47 AM  Clinical Narrative:    [name] still awaiting insurance approval.    Expected Discharge Plan: Skilled Nursing Facility Barriers to Discharge: Insurance Authorization  Expected Discharge Plan and Services Expected Discharge Plan: Skilled Nursing Facility In-house Referral: Clinical Social Work   Post Acute Care Choice: Skilled Nursing Facility Living arrangements for the past 2 months: Single Family Home Expected Discharge Date: 02/09/20                                     Social Determinants of Health (SDOH) Interventions    Readmission Risk Interventions Readmission Risk Prevention Plan 02/08/2020  Transportation Screening Complete  Medication Review [place]) Complete  PCP or Specialist appointment within 3-5 days of discharge Complete  HRI or Home Care Consult Complete  SW Recovery Care/Counseling Consult Complete  Palliative Care Screening Not Applicable  Skilled Nursing Facility Complete  Some recent data might be hidden

## 2020-02-10 LAB — GLUCOSE, CAPILLARY
Glucose-Capillary: 295 mg/dL — ABNORMAL HIGH (ref 70–99)
Glucose-Capillary: 237 mg/dL — ABNORMAL HIGH (ref 70–99)
Glucose-Capillary: 248 mg/dL — ABNORMAL HIGH (ref 70–99)
Glucose-Capillary: 166 mg/dL — ABNORMAL HIGH (ref 70–99)
Glucose-Capillary: 142 mg/dL — ABNORMAL HIGH (ref 70–99)

## 2020-02-10 LAB — CBC WITH DIFFERENTIAL/PLATELET
Abs Immature Granulocytes: 0.24 10*3/uL — ABNORMAL HIGH (ref 0.00–0.07)
Basophils Absolute: 0 10*3/uL (ref 0.0–0.1)
Basophils Relative: 0 %
Eosinophils Absolute: 0 10*3/uL (ref 0.0–0.5)
Eosinophils Relative: 0 %
HCT: 28.3 % — ABNORMAL LOW (ref 36.0–46.0)
Hemoglobin: 9 g/dL — ABNORMAL LOW (ref 12.0–15.0)
Immature Granulocytes: 3 %
Lymphocytes Relative: 7 %
Lymphs Abs: 0.6 10*3/uL — ABNORMAL LOW (ref 0.7–4.0)
MCH: 29.2 pg (ref 26.0–34.0)
MCHC: 31.8 g/dL (ref 30.0–36.0)
MCV: 91.9 fL (ref 80.0–100.0)
Monocytes Absolute: 0.4 10*3/uL (ref 0.1–1.0)
Monocytes Relative: 6 %
Neutro Abs: 6.2 10*3/uL (ref 1.7–7.7)
Neutrophils Relative %: 84 %
Platelets: 316 10*3/uL (ref 150–400)
RBC: 3.08 MIL/uL — ABNORMAL LOW (ref 3.87–5.11)
RDW: 13.1 % (ref 11.5–15.5)
WBC: 7.4 10*3/uL (ref 4.0–10.5)
nRBC: 0 % (ref 0.0–0.2)

## 2020-02-10 LAB — COMPREHENSIVE METABOLIC PANEL
ALT: 20 U/L (ref 0–44)
AST: 24 U/L (ref 15–41)
Albumin: 2.4 g/dL — ABNORMAL LOW (ref 3.5–5.0)
Alkaline Phosphatase: 46 U/L (ref 38–126)
Anion gap: 11 (ref 5–15)
BUN: 47 mg/dL — ABNORMAL HIGH (ref 8–23)
CO2: 23 mmol/L (ref 22–32)
Calcium: 9 mg/dL (ref 8.9–10.3)
Chloride: 100 mmol/L (ref 98–111)
Creatinine, Ser: 1.36 mg/dL — ABNORMAL HIGH (ref 0.44–1.00)
GFR calc Af Amer: 41 mL/min — ABNORMAL LOW (ref >60)
GFR calc non Af Amer: 36 mL/min — ABNORMAL LOW (ref >60)
Glucose, Bld: 339 mg/dL — ABNORMAL HIGH (ref 70–99)
Potassium: 4.4 mmol/L (ref 3.5–5.1)
Sodium: 134 mmol/L — ABNORMAL LOW (ref 135–145)
Total Bilirubin: 0.3 mg/dL (ref 0.3–1.2)
Total Protein: 6.4 g/dL — ABNORMAL LOW (ref 6.5–8.1)

## 2020-02-10 LAB — BRAIN NATRIURETIC PEPTIDE: B Natriuretic Peptide: 304.9 pg/mL — ABNORMAL HIGH (ref 0.0–100.0)

## 2020-02-10 LAB — CULTURE, BLOOD (ROUTINE X 2)
Culture: NO GROWTH
Culture: NO GROWTH
Special Requests: ADEQUATE

## 2020-02-10 LAB — PROCALCITONIN: Procalcitonin: <0.1 ng/mL

## 2020-02-10 LAB — C-REACTIVE PROTEIN: CRP: 5.5 mg/dL — ABNORMAL HIGH (ref <1.0)

## 2020-02-10 LAB — MAGNESIUM: Magnesium: 1.9 mg/dL (ref 1.7–2.4)

## 2020-02-10 LAB — D-DIMER, QUANTITATIVE: D-Dimer, Quant: 1.87 ug/mL-FEU — ABNORMAL HIGH (ref 0.00–0.50)

## 2020-02-10 NOTE — Progress Notes (Signed)
Physical Therapy Treatment Note  Patient progressing her mobility, requiring less assistance to stand from EOB with RW and bed height increased compared to last PT session but still requires two person assist. Patient able to progress to minimal distance ambulation with RW and two assist + close chair follow as she fatigues quickly. Continued recommendation for discharge to SNF for short term rehabiliation.    02/10/20 1454  PT Visit Information  Last PT Received On 02/10/20  Assistance Needed +2  PT/OT/SLP Co-Evaluation/Treatment Yes  Reason for Co-Treatment Complexity of the patient's impairments (multi-system involvement);For patient/therapist safety;Necessary to address cognition/behavior during functional activity  PT goals addressed during session Mobility/safety with mobility;Balance;Proper use of DME  History of Present Illness 86 year old female with medical history significant of CAD, HTN, HLD, seizure presented with increasing fatigue, dry cough and increasing SOB for 8 days. Pt had her 2nd COVID shot on 03/16. Symptoms began approximately 8 days prior to admission when she started to have dry cough and congestion, no chest pain, subjective fever but no chills, and gradually she feels tired and sleepy, poor appetite, and loose diarrhea x2 yesterday. In ED found to be COVID positive, X-ray B/L infiltrates, BMP showed Cre 1.98. Admitted 02/05/20 for treatment.   Subjective Data  Subjective Patient asking where her partials were (dentures).  Precautions  Precautions Fall;Other (comment)  Precaution Comments PICC  Restrictions  Weight Bearing Restrictions No  Pain Assessment  Pain Assessment Faces  Faces Pain Scale 2 (At first patient denied pain, then notes sore knees)  Pain Descriptors / Indicators Sore  Pain Intervention(s) Monitored during session;Limited activity within patient's tolerance;Repositioned  Cognition  Arousal/Alertness Awake/alert  Overall Cognitive Status  Impaired/Different from baseline  Area of Impairment Orientation  Orientation Level Disoriented to;Time  Bed Mobility  Overal bed mobility Needs Assistance  Bed Mobility Supine to Sit  Supine to sit Min assist;HOB elevated (with bedrail, HOB approx 20 degrees)  Transfers  Overall transfer level Needs assistance  Equipment used Rolling walker (2 wheeled)  Transfers Sit to/from [employer] Transfers  Sit to Stand Mod assist;Max assist;+2 physical assistance;From elevated surface  General transfer comment Cues and assist for LE placement prior to standing, cues for hand placement. modA x 2 for sit>stand from EOB, block to feet to prevent anterior sliding. MaxA x 2 for sit>stand from recliner chair, mod/maxA for lowering down into chair for stand>sit.    Ambulation/Gait  Ambulation/Gait assistance Min assist;+2 physical assistance;+2 safety/equipment  Gait Distance (Feet) 6 Feet  Assistive device Rolling walker (2 wheeled)  Gait Pattern/deviations Step-to pattern;Decreased step length - right;Decreased step length - left  General Gait Details Assist for RW management and chair follow. Patient fatigues quickly.  Gait velocity decreased  Balance  Overall balance assessment Needs assistance  Sitting-balance support Feet supported  Sitting balance-Leahy Scale Good  Sitting balance - Comments Patient able to bend down and reach feet sitting in chair.  Standing balance support Bilateral upper extremity supported  Standing balance-Leahy Scale Poor  Standing balance comment Requires UE support on RW.  General Comments  General comments (skin integrity, edema, etc.) Patient on room air. HR, oxygen saturation, and RR stable during session.  PT - End of Session  Equipment Utilized During Treatment Gait belt  Activity Tolerance Patient limited by fatigue  Patient left in chair;with call bell/phone within reach;with chair alarm set  Nurse Communication Mobility status;Other  (comment) (patient asking about dentures (doesnt appear they are here))   PT - Assessment/Plan  PT  Plan Current plan remains appropriate  PT Visit Diagnosis Unsteadiness on feet (R26.81);Other abnormalities of gait and mobility (R26.89);Muscle weakness (generalized) (M62.81);Difficulty in walking, not elsewhere classified (R26.2)  PT Frequency (ACUTE ONLY) Min 2X/week  Follow Up Recommendations SNF  PT equipment Rolling walker with 5" wheels;3in1 (PT)  AM-PAC PT "6 Clicks" Mobility Outcome Measure (Version 2)  Help needed turning from your back to your side while in a flat bed without using bedrails? 3  Help needed moving from lying on your back to sitting on the side of a flat bed without using bedrails? 3  Help needed moving to and from a bed to a chair (including a wheelchair)? 2  Help needed standing up from a chair using your arms (e.g., wheelchair or bedside chair)? 2  Help needed to walk in hospital room? 1  Help needed climbing 3-5 steps with a railing?  1  6 Click Score 12  Consider Recommendation of Discharge To: CIR/SNF/LTACH  PT Goal Progression  Progress towards PT goals Progressing toward goals  PT Time Calculation  PT Start Time (ACUTE ONLY) 1454  PT Stop Time (ACUTE ONLY) 1527  PT Time Calculation (min) (ACUTE ONLY) 33 min  PT General Charges  $$ ACUTE PT VISIT 1 Visit  PT Treatments  $Therapeutic Activity 8-22 mins   Angelene Giovanni, PT, DPT Acute Rehab [phone] office

## 2020-02-10 NOTE — TOC Progression Note (Signed)
Transition of Care [facility]) - Progression Note    Patient Details  Name: MILAYNA ROTENBERG MRN: [id number] Date of Birth: 1932-12-02  Transition of [facility]) CM/SW Contact  Mearl Latin, LCSW Phone Number: 02/10/2020, 9:56 AM  Clinical Narrative:    [name] has received notice from [facility] that they are no longer in network with [facility], which is not true per [place]. Their corporate office is contacting [employer] to see what the issue is.    Expected Discharge Plan: Skilled Nursing Facility Barriers to Discharge: Insurance Authorization  Expected Discharge Plan and Services Expected Discharge Plan: Skilled Nursing Facility In-house Referral: Clinical Social Work   Post Acute Care Choice: Skilled Nursing Facility Living arrangements for the past 2 months: Single Family Home Expected Discharge Date: 02/10/20                                     Social Determinants of Health (SDOH) Interventions    Readmission Risk Interventions Readmission Risk Prevention Plan 02/08/2020  Transportation Screening Complete  Medication Review [employer]) Complete  PCP or Specialist appointment within 3-5 days of discharge Complete  HRI or Home Care Consult Complete  SW Recovery Care/Counseling Consult Complete  Palliative Care Screening Not Applicable  Skilled Nursing Facility Complete  Some recent data might be hidden

## 2020-02-10 NOTE — Progress Notes (Signed)
This RN received in report that patient no longer has IV access due to her PICC line being removed because patient was supposed to be discharged to a SNF today, 3/31, but discharge has been delayed until tomorrow. Patient is not receiving any IV medications. Kirby,NP notified about this patient not having IV access. No new orders placed. Will continue to monitor and treat per MD orders.

## 2020-02-10 NOTE — Discharge Summary (Addendum)
[name] FBX:[id number] DOB: May 30, 1933 DOA: 02/05/2020  PCP: Cassandria Anger, MD  Admit date: 02/05/2020  Discharge date: 02/11/2020  Admitted From: Home  Disposition:  SNF   Recommendations for Outpatient Follow-up:   Follow up with PCP in 1-2 weeks  PCP Please obtain BMP/CBC, 2 view CXR in 1week,  (see Discharge instructions)   PCP Please follow up on the following pending results:    Home Health: None   Equipment/Devices: None  Consultations: None  Discharge Condition: Stable    CODE STATUS: Full   Diet Recommendation: Heart Healthy Low Carb, check CBGs before every meal at bedtime   Chief Complaint  Patient presents with  . Weakness  . Fever     Brief history of present illness from the day of admission and additional interim summary    86 y.o.femalewith medical history significant ofCAD, HTN, HLD, Seizure presented with increasing fatigue, dry cough and increasing SOB for 8 days. Pt had her 2nd COVID shot on 03/16. She has been feeling fine until about 8 days ago, she started to have dry cough and congestion, no chest pain, subjective fever but no chills, and gradually she feels tired and sleepy, poor appetite, and loose diarrhea x 2 yesterday. She started complaining of some weakness and nausea came to the ER after which she was diagnosed with COVID-19 pneumonia and admitted to the hospital.                                                                 Hospital Course   1. Acute Covid 19 Viral Pneumonitis during the ongoing 2020 Covid 19 Pandemic - she had moderate disease and was treated with IV steroids and remdesivir to good effect, now symptom-free and has finished her remdesivir course.  Note she is being placed on oral prednisone taper thereafter continue her chronic home dose of  prednisone which is 5 mg to be taken as needed on a daily basis for the feeling of weakness, patient has been taking it for a while.  SpO2: 98 %  Recent Labs  Lab 02/06/20 0318 02/07/20 0343 02/07/20 0413 02/08/20 0315 02/09/20 0425 02/09/20 0435 02/10/20 0404  CRP 20.1* 14.1*  --  9.9*  --  7.3* 5.5*  DDIMER 2.77* 2.06*  --  2.01*  --  2.08* 1.87*  FERRITIN 373*  --   --   --   --   --   --   BNP  --   --  576.8* 240.2* 212.1*  --  304.9*  PROCALCITON 0.30 0.14  --  0.13  --  <0.10 <0.10    Hepatic Function Latest Ref Rng & Units 02/10/2020 02/09/2020 02/08/2020  Total Protein 6.5 - 8.1 g/dL 6.4(L) 6.6 7.1  Albumin 3.5 - 5.0 g/dL 2.4(L) 2.4(L) 2.4(L)  AST 15 - 41 U/L 24 29 35  ALT 0 - 44 U/L _0 Alk Phosphatase 38 - 126 U/L 46 48 50  Total Bilirubin 0.3 - 1.2 mg/dL 0.3 0.8 0.6  Bilirubin, Direct 0.0 - 0.3 mg/dL - - -    2.  Dehydration with AKI -improved after hydration with IV fluids.  3.  History of seizures.  Stable on Keppra.  4.  Weakness and deconditioning.  PT OT and supportive care, requires SNF.  5.  Anion gap metabolic acidosis.  From diarrhea.    Resolved.  6.  UTI.  Currently on Rocephin will give 3 doses.  Resolved clinically.  7.  Essential hypertension.  On Coreg, multiple allergies, started on Norvasc (mild allergy of dizziness, did not have any problems in the hospital with Norvasc)  Remains stable.   Discharge diagnosis     Active Problems:   COVID-19 virus infection   COVID-19    Discharge instructions    Discharge Instructions    Discharge instructions   Complete by: As directed    Follow with Primary MD Plotnikov, Evie Lacks, MD in 7 days   Get CBC, CMP, 2 view Chest X ray -  checked next visit within 1 week by Primary MD or SNF MD    Activity: As tolerated with Full fall precautions use walker/cane & assistance as needed  Disposition SNF  Diet: Heart Healthy     Special Instructions: If you have smoked or chewed Tobacco   in the last 2 yrs please stop smoking, stop any regular Alcohol  and or any Recreational drug use.  On your next visit with your primary care physician please Get Medicines reviewed and adjusted.  Please request your Prim.MD to go over all Hospital Tests and Procedure/Radiological results at the follow up, please get all Hospital records sent to your Prim MD by signing hospital release before you go home.  If you experience worsening of your admission symptoms, develop shortness of breath, life threatening emergency, suicidal or homicidal thoughts you must seek medical attention immediately by calling 911 or calling your MD immediately  if symptoms less severe.  You Must read complete instructions/literature along with all the possible adverse reactions/side effects for all the Medicines you take and that have been prescribed to you. Take any new Medicines after you have completely understood and accpet all the possible adverse reactions/side effects.   Increase activity slowly   Complete by: As directed       Discharge Medications   Allergies as of 02/11/2020      Reactions   Ativan [lorazepam] Other (See Comments)   Very confused   Tramadol Hcl Anxiety, Rash   Headache   Amlodipine Besylate Other (See Comments)    dizzy   Atenolol Other (See Comments)   Fatigue   Benazepril Cough   Benicar [olmesartan Medoxomil] Other (See Comments)   HEADACHE   Cozaar Nausea And Vomiting   Hydralazine Other (See Comments)   Hair loss   Hydrochlorothiazide W-triamterene Other (See Comments)    dizzy   Hydrocodone Other (See Comments)   HEADACHE   Hydroxyzine Pamoate Other (See Comments)   Per MAR   Iodine Other (See Comments)   Per MAR   Lisinopril Other (See Comments), Cough   Tired & fatigue   Peach Flavor Itching   Penicillins Itching   Has patient had a PCN reaction causing immediate rash, facial/tongue/throat swelling, SOB or lightheadedness with hypotension: No Has patient had a  PCN  reaction causing severe rash involving mucus membranes or skin necrosis: No Has patient had a PCN reaction that required hospitalization No Has patient had a PCN reaction occurring within the last 10 years: Yes If all of the above answers are "NO", then may proceed with Cephalosporin use. tolerates cephalosporins OK   Pravastatin Other (See Comments)   Myalgias-muscle pain   Prednisolone Nausea Only, Other (See Comments)   Upset stomach   Strawberry Flavor Itching   Tramadol Hcl Other (See Comments)   headache   Benadryl [diphenhydramine] Itching, Palpitations      Medication List    STOP taking these medications   ciprofloxacin 250 MG tablet Commonly known as: Cipro   CoQ10 200 MG Caps   cyclobenzaprine 5 MG tablet Commonly known as: FLEXERIL   DULoxetine 20 MG capsule Commonly known as: Cymbalta   furosemide 20 MG tablet Commonly known as: LASIX   methylPREDNISolone 4 MG Tbpk tablet Commonly known as: MEDROL DOSEPAK   nitrofurantoin 100 MG capsule Commonly known as: MACRODANTIN     TAKE these medications   Accu-Chek Aviva Soln Use as directed What changed:   how much to take  how to take this  when to take this  additional instructions   acetaminophen 650 MG CR tablet Commonly known as: TYLENOL Take 1,300 mg by mouth every 8 (eight) hours as needed for pain.   amLODipine 10 MG tablet Commonly known as: NORVASC Take 1 tablet (10 mg total) by mouth daily.   aspirin EC 325 MG tablet Take 325 mg by mouth daily. What changed: Another medication with the same name was removed. Continue taking this medication, and follow the directions you see here.   B-D SINGLE USE SWABS REGULAR Pads Use to clean area to check blood sugars twice a day   bisacodyl 5 MG EC tablet Generic drug: bisacodyl Take 5-10 mg by mouth daily as needed for moderate constipation.   brimonidine 0.2 % ophthalmic solution Commonly known as: ALPHAGAN Place 1 drop into both eyes 2  (two) times daily.   carvedilol 25 MG tablet Commonly known as: COREG TAKE 1 TABLET BY MOUTH TWICE DAILY WITH MEALS   cholecalciferol 25 MCG (1000 UNIT) tablet Commonly known as: VITAMIN D Take 1,000 Units by mouth daily. What changed: Another medication with the same name was removed. Continue taking this medication, and follow the directions you see here.   clopidogrel 75 MG tablet Commonly known as: PLAVIX Take 1 tablet (75 mg total) by mouth daily.   feeding supplement (GLUCERNA SHAKE) Liqd Take 237 mLs by mouth 3 (three) times daily between meals.   glucose blood test strip Commonly known as: Civil engineer, contracting Use as instructed What changed:   how much to take  how to take this  when to take this  additional instructions   guaiFENesin-dextromethorphan 100-10 MG/5ML syrup Commonly known as: ROBITUSSIN DM Take 10 mLs by mouth every 4 (four) hours as needed for cough.   levETIRAcetam 500 MG tablet Commonly known as: KEPPRA TAKE 1 TABLET TWICE DAILY What changed: when to take this   nitroGLYCERIN 0.4 MG SL tablet Commonly known as: NITROSTAT Place 1 tablet (0.4 mg total) under the tongue every 5 (five) minutes x 3 doses as needed for chest pain.   ondansetron 4 MG tablet Commonly known as: ZOFRAN TAKE 1 TABLET BY MOUTH TWICE DAILY AS NEEDED FOR NAUSEA FOR VOMITING What changed:   how much to take  how to take this  when to  take this  reasons to take this  additional instructions   OneTouch Delica Lancets Fine Misc 1 Device by Does not apply route daily as needed. What changed: reasons to take this   pantoprazole 40 MG tablet Commonly known as: Protonix Take 1 tablet (40 mg total) by mouth daily.   predniSONE 5 MG tablet Commonly known as: DELTASONE Take 4 Pills PO for 3 days, 2 Pills PO for 3 days, 1 Pills PO for 3 days scheduled, after that patient chronically takes 5 mg of prednisone daily as needed as needed for weakness and lack of energy.   Continue as needed 5 mg prednisone on a daily basis as before. What changed:   how much to take  how to take this  when to take this  additional instructions   rosuvastatin 20 MG tablet Commonly known as: CRESTOR Take 1 tablet (20 mg total) by mouth daily.   Vitamin B-12 3000 MCG Subl Place 3,000 mcg under the tongue daily.            Durable Medical Equipment  (From admission, onward)         Start     Ordered   02/08/20 1026  For home use only DME 3 n 1  Once     02/08/20 1026           Contact information for follow-up providers    Plotnikov, Evie Lacks, MD. Schedule an appointment as soon as possible for a visit in 2 week(s).   Specialty: Internal Medicine Contact information: [place] [address] [phone]        Minus Breeding, MD .   Specialty: Cardiology Contact information: [address] [place] [address] [phone]            Contact information for after-discharge care    Destination    [facility] Preferred SNF .   Service: Skilled Nursing Contact information: [address] [place] [phone]                  Major procedures and Radiology Reports - PLEASE review detailed and final reports thoroughly  -       DG Chest Portable 1 View  Result Date: 02/05/2020 CLINICAL DATA:  Fever and chills with cough. EXAM: PORTABLE CHEST 1 VIEW COMPARISON:  10/16/2019 FINDINGS: Asymmetric infiltrate in the left more than right lung. Cardiomegaly and vascular pedicle widening. No visible effusion or pneumothorax. IMPRESSION: Asymmetric airspace disease, consistent with pneumonia in this setting. Electronically Signed   By: Monte Fantasia M.D.   On: 02/05/2020 10:22   [place] EKG SITE RITE  Result Date: 02/05/2020 If Site Rite image not attached, placement could not be confirmed due to current cardiac rhythm.   Micro Results     Recent Results (from the  past 240 hour(s))  Blood culture (routine x 2)     Status: None   Collection Time: 02/05/20 10:37 AM   Specimen: BLOOD  Result Value Ref Range Status   Specimen Description BLOOD BLOOD RIGHT HAND  Final   Special Requests   Final    AEROBIC BOTTLE ONLY Blood Culture results may not be optimal due to an inadequate volume of blood received in culture bottles   Culture   Final    NO GROWTH 5 DAYS Performed at [facility], [place] [address], [place], [place] [address]    Report Status 02/10/2020 FINAL  Final  Blood culture (  routine x 2)     Status: None   Collection Time: 02/05/20  1:43 PM   Specimen: BLOOD  Result Value Ref Range Status   Specimen Description BLOOD BLOOD LEFT HAND  Final   Special Requests   Final    BOTTLES DRAWN AEROBIC AND ANAEROBIC Blood Culture adequate volume   Culture   Final    NO GROWTH 5 DAYS Performed at [facility], [address] [place], [place] [address]    Report Status 02/10/2020 FINAL  Final  Urine culture     Status: None   Collection Time: 02/05/20  2:59 PM   Specimen: Urine, Random  Result Value Ref Range Status   Specimen Description URINE, RANDOM  Final   Special Requests NONE  Final   Culture   Final    NO GROWTH Performed at [facility], [place] [address], [place], [place] [address]    Report Status 02/06/2020 FINAL  Final    Today   Subjective   Patient in bed, appears comfortable, denies any headache, no fever, no chest pain or pressure, no shortness of breath , no abdominal pain. No focal weakness.   Objective   Blood pressure (!) 150/64, pulse 67, temperature 98.3 F (36.8 C), temperature source Oral, resp. rate 16, height _0  (1.676 m), weight 76.1 kg, SpO2 98 %.   Intake/Output Summary (Last 24 hours) at 02/11/2020 0946 Last data filed at 02/11/2020 0540 Gross per 24 hour  Intake 240 ml  Output 300 ml  Net -60 ml    Exam  Awake Alert,   No new F.N deficits, Normal affect [place].AT,PERRAL Supple  Neck,No JVD, No cervical lymphadenopathy appriciated.  Symmetrical Chest wall movement, Good air movement bilaterally, CTAB RRR,No Gallops,Rubs or new Murmurs, No Parasternal Heave +ve B.Sounds, Abd Soft, Non tender, No organomegaly appriciated, No rebound -guarding or rigidity. No Cyanosis, Clubbing or edema, No new Rash or bruise   Data Review   CBC w Diff:  Lab Results  Component Value Date   WBC 7.4 02/10/2020   HGB 9.0 (L) 02/10/2020   HGB 10.8 (L) 06/18/2006   HCT 28.3 (L) 02/10/2020   HCT 32.5 (L) 06/18/2006   PLT 316 02/10/2020   PLT 212 06/18/2006   LYMPHOPCT 7 02/10/2020   LYMPHOPCT 25.6 06/18/2006   BANDSPCT 6 11/18/2017   MONOPCT 6 02/10/2020   MONOPCT 12.6 06/18/2006   EOSPCT 0 02/10/2020   EOSPCT 2.5 06/18/2006   BASOPCT 0 02/10/2020   BASOPCT 0.7 06/18/2006    CMP:  Lab Results  Component Value Date   NA 134 (L) 02/10/2020   NA 134 (A) 04/25/2015   K 4.4 02/10/2020   CL 100 02/10/2020   CO2 23 02/10/2020   BUN 47 (H) 02/10/2020   BUN 36 (A) 04/25/2015   CREATININE 1.36 (H) 02/10/2020   GLU 77 04/25/2015   PROT 6.4 (L) 02/10/2020   ALBUMIN 2.4 (L) 02/10/2020   BILITOT 0.3 02/10/2020   ALKPHOS 46 02/10/2020   AST 24 02/10/2020   ALT 20 02/10/2020  .   Total Time in preparing paper work, data evaluation and todays exam - 71 minutes  Lala Lund M.D on 02/11/2020 at 9:46 AM  [facility]   Office  [phone]

## 2020-02-10 NOTE — Progress Notes (Signed)
Occupational Therapy Treatment Patient Details Name: [name] MRN: [id number] DOB: [birth year] Today's Date: 02/10/2020    History of present illness 86 year old female with medical history significant of CAD, HTN, HLD, seizure presented with increasing fatigue, dry cough and increasing SOB for 8 days. Pt had her 2nd COVID shot on 03/16. Symptoms began approximately 8 days prior to admission when she started to have dry cough and congestion, no chest pain, subjective fever but no chills, and gradually she feels tired and sleepy, poor appetite, and loose diarrhea x2 yesterday. In ED found to be COVID positive, X-ray B/L infiltrates, BMP showed Cre 1.98. Admitted 02/05/20 for treatment.    OT comments  Pt progressing with OT goals gradually. Pt Min A for bed mobility to sit EOB, Mod A x 2 for initial sit to stand from elevated bed with RW. Pt Min A for stand pivot to recliner chair with assistance to advance RW and sequence safely. Pt demo ability to reach B feet to pull up socks, but requires assistance for LB due to fatigue and decreased coordination. Pt Max A  X 2 for two sit to stands from lower recliner chair surface, cues needed for safe hand placement and correcting posture. Pt able to demo ability to take a few steps with RW and close chair follow. Continue to recommend SNF for short term rehab.    Follow Up Recommendations  SNF;Supervision/Assistance - 24 hour    Equipment Recommendations  Other (comment);3 in 1 bedside commode    Recommendations for Other Services      Precautions / Restrictions Precautions Precautions: Fall;Other (comment) Precaution Comments: PICC Restrictions Weight Bearing Restrictions: No       Mobility Bed Mobility Overal bed mobility: Needs Assistance Bed Mobility: Supine to Sit     Supine to sit: Min assist;HOB elevated(with bedrail, HOB approx 20 degrees)     General bed mobility comments: heavy use of bed rail  Transfers Overall transfer  level: Needs assistance Equipment used: Rolling walker (2 wheeled) Transfers: Sit to/from [employer] Sit to Stand: Mod assist;Max assist;+2 physical assistance;From elevated surface         General transfer comment: Cues and assist for LE placement prior to standing, cues for hand placement. modA x 2 for sit>stand from EOB, block to feet to prevent anterior sliding. MaxA x 2 for sit>stand from recliner chair, mod/maxA for lowering down into chair for stand>sit.      Balance Overall balance assessment: Needs assistance Sitting-balance support: Feet supported Sitting balance-Leahy Scale: Good Sitting balance - Comments: Patient able to bend down and reach feet sitting in chair.   Standing balance support: Bilateral upper extremity supported Standing balance-Leahy Scale: Poor Standing balance comment: Requires UE support on RW.                           ADL either performed or assessed with clinical judgement   ADL Overall ADL's : Needs assistance/impaired                     Lower Body Dressing: Moderate assistance;Sitting/lateral leans Lower Body Dressing Details (indicate cue type and reason): Pt able to demo reaching to feet, but requires assistance due to fatigue and decreased coordination             Functional mobility during ADLs: Minimal assistance;+2 for physical assistance;Rolling walker General ADL Comments: Pt limited by increasing fatigue and perseveration on locating dentures  Vision       Perception     Praxis      Cognition Arousal/Alertness: Awake/alert Behavior During Therapy: WFL for tasks assessed/performed Overall Cognitive Status: Impaired/Different from baseline Area of Impairment: Orientation                 Orientation Level: Disoriented to;Time;Situation                      Exercises     Shoulder Instructions       General Comments VSS on RA    Pertinent Vitals/ Pain        Pain Assessment: Faces Faces Pain Scale: Hurts a little bit(At first patient denied pain, then notes sore knees) Pain Location: arthritic knee pain Pain Descriptors / Indicators: Sore Pain Intervention(s): Monitored during session;Limited activity within patient's tolerance;Repositioned  Home Living                                          Prior Functioning/Environment              Frequency  Min 2X/week        Progress Toward Goals  OT Goals(current goals can now be found in the care plan section)  Progress towards OT goals: Progressing toward goals  Acute Rehab OT Goals Patient Stated Goal: find dentures OT Goal Formulation: With patient Time For Goal Achievement: 02/21/20 Potential to Achieve Goals: Good ADL Goals Pt Will Perform Grooming: with supervision;sitting Pt Will Perform Upper Body Dressing: with set-up;with supervision;sitting Pt Will Perform Lower Body Dressing: with min assist;sit to/from stand;sitting/lateral leans Pt Will Transfer to Toilet: with min assist;stand pivot transfer Pt Will Perform Toileting - Clothing Manipulation and hygiene: with min assist;sitting/lateral leans;sit to/from stand Pt/caregiver will Perform Home Exercise Program: Increased strength;Both right and left upper extremity;With written HEP provided;With Supervision;Increased ROM Additional ADL Goal #1: Pt will tolerate sitting EOB >10 min at supervision level as precursor to ADL.  Plan Discharge plan remains appropriate    Co-evaluation    PT/OT/SLP Co-Evaluation/Treatment: Yes Reason for Co-Treatment: Complexity of the patient's impairments (multi-system involvement);For patient/therapist safety;To address functional/ADL transfers PT goals addressed during session: Mobility/safety with mobility;Balance;Proper use of DME OT goals addressed during session: ADL's and self-care;Other (comment)(ADL transfers)      AM-PAC OT "6 Clicks" Daily Activity      Outcome Measure   Help from another person eating meals?: A Little Help from another person taking care of personal grooming?: A Little Help from another person toileting, which includes using toliet, bedpan, or urinal?: Total Help from another person bathing (including washing, rinsing, drying)?: A Lot Help from another person to put on and taking off regular upper body clothing?: A Lot Help from another person to put on and taking off regular lower body clothing?: A Lot 6 Click Score: 13    End of Session Equipment Utilized During Treatment: Gait belt;Rolling walker  OT Visit Diagnosis: Muscle weakness (generalized) (M62.81);Other abnormalities of gait and mobility (R26.89)   Activity Tolerance Patient limited by fatigue;Patient tolerated treatment well   Patient Left in chair;with call bell/phone within reach;with chair alarm set   Nurse Communication Mobility status        Time: 4332-9518 OT Time Calculation (min): 33 min  Charges: OT General Charges $OT Visit: 1 Visit OT Treatments $Therapeutic Activity: 8-22 mins  Layla Maw, OTR/L  Lorre Munroe 02/10/2020, 4:06 PM

## 2020-02-10 NOTE — TOC Progression Note (Signed)
Transition of Care [facility]) - Progression Note    Patient Details  Name: [name] MRN: [id number] Date of Birth: 1933-07-27  Transition of [facility]) CM/SW Contact  Mearl Latin, LCSW Phone Number: 02/10/2020, 2:22 PM  Clinical Narrative:    Per [facility] will be able to accept patient with COVID (and her spouse as well since he was also set to go to [place]). CSW spoke with patient's daughter, [name]. She is in agreement with the plan. [name] has started English as a second language teacher.    Expected Discharge Plan: Skilled Nursing Facility Barriers to Discharge: Insurance Authorization  Expected Discharge Plan and Services Expected Discharge Plan: Skilled Nursing Facility In-house Referral: Clinical Social Work   Post Acute Care Choice: Skilled Nursing Facility Living arrangements for the past 2 months: Single Family Home Expected Discharge Date: 02/10/20                                     Social Determinants of Health (SDOH) Interventions    Readmission Risk Interventions Readmission Risk Prevention Plan 02/08/2020  Transportation Screening Complete  Medication Review [employer]) Complete  PCP or Specialist appointment within 3-5 days of discharge Complete  HRI or Home Care Consult Complete  SW Recovery Care/Counseling Consult Complete  Palliative Care Screening Not Applicable  Skilled Nursing Facility Complete  Some recent data might be hidden

## 2020-02-11 DIAGNOSIS — M25562 Pain in left knee: Secondary | ICD-10-CM | POA: Diagnosis not present

## 2020-02-11 DIAGNOSIS — M19071 Primary osteoarthritis, right ankle and foot: Secondary | ICD-10-CM | POA: Diagnosis not present

## 2020-02-11 DIAGNOSIS — E860 Dehydration: Secondary | ICD-10-CM | POA: Diagnosis not present

## 2020-02-11 DIAGNOSIS — M6259 Muscle wasting and atrophy, not elsewhere classified, multiple sites: Secondary | ICD-10-CM | POA: Diagnosis not present

## 2020-02-11 DIAGNOSIS — M6389 Disorders of muscle in diseases classified elsewhere, multiple sites: Secondary | ICD-10-CM | POA: Diagnosis not present

## 2020-02-11 DIAGNOSIS — R1311 Dysphagia, oral phase: Secondary | ICD-10-CM | POA: Diagnosis not present

## 2020-02-11 DIAGNOSIS — N179 Acute kidney failure, unspecified: Secondary | ICD-10-CM | POA: Diagnosis not present

## 2020-02-11 DIAGNOSIS — U071 COVID-19: Secondary | ICD-10-CM | POA: Diagnosis not present

## 2020-02-11 DIAGNOSIS — R41841 Cognitive communication deficit: Secondary | ICD-10-CM | POA: Diagnosis not present

## 2020-02-11 DIAGNOSIS — R509 Fever, unspecified: Secondary | ICD-10-CM | POA: Diagnosis not present

## 2020-02-11 DIAGNOSIS — E872 Acidosis: Secondary | ICD-10-CM | POA: Diagnosis not present

## 2020-02-11 DIAGNOSIS — K5900 Constipation, unspecified: Secondary | ICD-10-CM | POA: Diagnosis not present

## 2020-02-11 DIAGNOSIS — M25571 Pain in right ankle and joints of right foot: Secondary | ICD-10-CM | POA: Diagnosis not present

## 2020-02-11 DIAGNOSIS — I517 Cardiomegaly: Secondary | ICD-10-CM | POA: Diagnosis not present

## 2020-02-11 DIAGNOSIS — R7989 Other specified abnormal findings of blood chemistry: Secondary | ICD-10-CM | POA: Diagnosis not present

## 2020-02-11 DIAGNOSIS — M6281 Muscle weakness (generalized): Secondary | ICD-10-CM | POA: Diagnosis not present

## 2020-02-11 DIAGNOSIS — Z7952 Long term (current) use of systemic steroids: Secondary | ICD-10-CM | POA: Diagnosis not present

## 2020-02-11 DIAGNOSIS — F4321 Adjustment disorder with depressed mood: Secondary | ICD-10-CM | POA: Diagnosis not present

## 2020-02-11 DIAGNOSIS — M159 Polyosteoarthritis, unspecified: Secondary | ICD-10-CM | POA: Diagnosis not present

## 2020-02-11 DIAGNOSIS — J189 Pneumonia, unspecified organism: Secondary | ICD-10-CM | POA: Diagnosis not present

## 2020-02-11 DIAGNOSIS — I499 Cardiac arrhythmia, unspecified: Secondary | ICD-10-CM | POA: Diagnosis not present

## 2020-02-11 DIAGNOSIS — E119 Type 2 diabetes mellitus without complications: Secondary | ICD-10-CM | POA: Diagnosis not present

## 2020-02-11 DIAGNOSIS — E871 Hypo-osmolality and hyponatremia: Secondary | ICD-10-CM | POA: Diagnosis not present

## 2020-02-11 DIAGNOSIS — R0989 Other specified symptoms and signs involving the circulatory and respiratory systems: Secondary | ICD-10-CM | POA: Diagnosis not present

## 2020-02-11 DIAGNOSIS — D72829 Elevated white blood cell count, unspecified: Secondary | ICD-10-CM | POA: Diagnosis not present

## 2020-02-11 DIAGNOSIS — R0602 Shortness of breath: Secondary | ICD-10-CM | POA: Diagnosis not present

## 2020-02-11 DIAGNOSIS — I10 Essential (primary) hypertension: Secondary | ICD-10-CM | POA: Diagnosis not present

## 2020-02-11 DIAGNOSIS — R2681 Unsteadiness on feet: Secondary | ICD-10-CM | POA: Diagnosis not present

## 2020-02-11 DIAGNOSIS — I700 Atherosclerosis of aorta: Secondary | ICD-10-CM | POA: Diagnosis not present

## 2020-02-11 DIAGNOSIS — R52 Pain, unspecified: Secondary | ICD-10-CM | POA: Diagnosis not present

## 2020-02-11 DIAGNOSIS — R5381 Other malaise: Secondary | ICD-10-CM | POA: Diagnosis not present

## 2020-02-11 DIAGNOSIS — J1282 Pneumonia due to coronavirus disease 2019: Secondary | ICD-10-CM | POA: Diagnosis not present

## 2020-02-11 DIAGNOSIS — R197 Diarrhea, unspecified: Secondary | ICD-10-CM | POA: Diagnosis not present

## 2020-02-11 DIAGNOSIS — N390 Urinary tract infection, site not specified: Secondary | ICD-10-CM | POA: Diagnosis not present

## 2020-02-11 DIAGNOSIS — Z743 Need for continuous supervision: Secondary | ICD-10-CM | POA: Diagnosis not present

## 2020-02-11 DIAGNOSIS — Z87898 Personal history of other specified conditions: Secondary | ICD-10-CM | POA: Diagnosis not present

## 2020-02-11 DIAGNOSIS — R279 Unspecified lack of coordination: Secondary | ICD-10-CM | POA: Diagnosis not present

## 2020-02-11 DIAGNOSIS — M19072 Primary osteoarthritis, left ankle and foot: Secondary | ICD-10-CM | POA: Diagnosis not present

## 2020-02-11 LAB — GLUCOSE, CAPILLARY
Glucose-Capillary: 154 mg/dL — ABNORMAL HIGH (ref 70–99)
Glucose-Capillary: 116 mg/dL — ABNORMAL HIGH (ref 70–99)

## 2020-02-11 NOTE — TOC Transition Note (Signed)
Transition of Care (TOC) - CM/SW Discharge Note   Patient will DC to: [facility] SNF Anticipated DC date: 02/11/20 Family notified: 02/11/20 Transport by: Sharin Mons   Per MD patient ready for DC to . RN, patient, patient's family, and facility notified of DC. Discharge Summary and FL2 sent to facility. RN to call report prior to discharge [phone], [facility]). DC packet on chart. Ambulance transport requested for patient.   CSW will sign off for now as social work intervention is no longer needed. Please consult [place] again if new needs arise.    Patient Details  Name: [name] MRN: [id number] Date of Birth: Sep 08, 1933  Transition of Care [facility]) CM/SW Contact:  Erin Sons, LCSW Phone Number: 02/11/2020, 10:47 AM   Clinical Narrative:       Final next level of care: Skilled Nursing Facility Barriers to Discharge: No Barriers Identified   Patient Goals and CMS Choice Patient states their goals for this hospitalization and ongoing recovery are:: Rehab CMS Medicare.gov Compare Post Acute Care list provided to:: Patient Choice offered to / list presented to : Patient, Adult Children(Daughter [name])  Discharge Placement   Existing PASRR number confirmed : 02/11/20          Patient chooses bed at: [facility] Patient to be transferred to facility by: [facility] Name of family member notified: Asencion Gowda Patient and family notified of of transfer: 02/11/20  Discharge Plan and Services In-house Referral: Clinical Social Work   Post Acute Care Choice: Skilled Nursing Facility                               Social Determinants of Health (SDOH) Interventions     Readmission Risk Interventions Readmission Risk Prevention Plan 02/08/2020  Transportation Screening Complete  Medication Review Oceanographer) Complete  PCP or Specialist appointment within 3-5 days of discharge Complete  HRI or Home Care Consult Complete  SW Recovery Care/Counseling Consult Complete   Palliative Care Screening Not Applicable  Skilled Nursing Facility Complete  Some recent data might be hidden

## 2020-02-11 NOTE — Plan of Care (Signed)

## 2020-02-11 NOTE — Discharge Instructions (Signed)
Follow with Primary MD Plotnikov, [name], MD in 7 days   Get CBC, CMP, 2 view Chest X ray -  checked next visit within 1 week by Primary MD or SNF MD    Activity: As tolerated with Full fall precautions use walker/cane & assistance as needed  Disposition SNF  Diet: Heart Healthy     Special Instructions: If you have smoked or chewed Tobacco  in the last 2 yrs please stop smoking, stop any regular Alcohol  and or any Recreational drug use.  On your next visit with your primary care physician please Get Medicines reviewed and adjusted.  Please request your Prim.MD to go over all Hospital Tests and Procedure/Radiological results at the follow up, please get all Hospital records sent to your Prim MD by signing hospital release before you go home.  If you experience worsening of your admission symptoms, develop shortness of breath, life threatening emergency, suicidal or homicidal thoughts you must seek medical attention immediately by calling 911 or calling your MD immediately  if symptoms less severe.  You Must read complete instructions/literature along with all the possible adverse reactions/side effects for all the Medicines you take and that have been prescribed to you. Take any new Medicines after you have completely understood and accpet all the possible adverse reactions/side effects.        COVID-19 COVID-19 is a respiratory infection that is caused by a virus called severe acute respiratory syndrome coronavirus 2 (SARS-CoV-2). The disease is also known as coronavirus disease or novel coronavirus. In some people, the virus may not cause any symptoms. In others, it may cause a serious infection. The infection can get worse quickly and can lead to complications, such as:  Pneumonia, or infection of the lungs.  Acute respiratory distress syndrome or ARDS. This is a condition in which fluid build-up in the lungs prevents the lungs from filling with air and passing oxygen into the  blood.  Acute respiratory failure. This is a condition in which there is not enough oxygen passing from the lungs to the body or when carbon dioxide is not passing from the lungs out of the body.  Sepsis or septic shock. This is a serious bodily reaction to an infection.  Blood clotting problems.  Secondary infections due to bacteria or fungus.  Organ failure. This is when your body's organs stop working. The virus that causes COVID-19 is contagious. This means that it can spread from person to person through droplets from coughs and sneezes (respiratory secretions). What are the causes? This illness is caused by a virus. You may catch the virus by:  Breathing in droplets from an infected person. Droplets can be spread by a person breathing, speaking, singing, coughing, or sneezing.  Touching something, like a table or a doorknob, that was exposed to the virus (contaminated) and then touching your mouth, nose, or eyes. What increases the risk? Risk for infection You are more likely to be infected with this virus if you:  Are within 6 feet (2 meters) of a person with COVID-19.  Provide care for or live with a person who is infected with COVID-19.  Spend time in crowded indoor spaces or live in shared housing. Risk for serious illness You are more likely to become seriously ill from the virus if you:  Are 3 years of age or older. The higher your age, the more you are at risk for serious illness.  Live in a nursing home or long-term care facility.  Have cancer.  Have a long-term (chronic) disease such as: ? Chronic lung disease, including chronic obstructive pulmonary disease or asthma. ? A long-term disease that lowers your body's ability to fight infection (immunocompromised). ? Heart disease, including heart failure, a condition in which the arteries that lead to the heart become narrow or blocked (coronary artery disease), a disease which makes the heart muscle thick, weak,  or stiff (cardiomyopathy). ? Diabetes. ? Chronic kidney disease. ? Sickle cell disease, a condition in which red blood cells have an abnormal "sickle" shape. ? Liver disease.  Are obese. What are the signs or symptoms? Symptoms of this condition can range from mild to severe. Symptoms may appear any time from 2 to 14 days after being exposed to the virus. They include:  A fever or chills.  A cough.  Difficulty breathing.  Headaches, body aches, or muscle aches.  Runny or stuffy (congested) nose.  A sore throat.  New loss of taste or smell. Some people may also have stomach problems, such as nausea, vomiting, or diarrhea. Other people may not have any symptoms of COVID-19. How is this diagnosed? This condition may be diagnosed based on:  Your signs and symptoms, especially if: ? You live in an area with a COVID-19 outbreak. ? You recently traveled to or from an area where the virus is common. ? You provide care for or live with a person who was diagnosed with COVID-19. ? You were exposed to a person who was diagnosed with COVID-19.  A physical exam.  Lab tests, which may include: ? Taking a sample of fluid from the back of your nose and throat (nasopharyngeal fluid), your nose, or your throat using a swab. ? A sample of mucus from your lungs (sputum). ? Blood tests.  Imaging tests, which may include, X-rays, CT scan, or ultrasound. How is this treated? At present, there is no medicine to treat COVID-19. Medicines that treat other diseases are being used on a trial basis to see if they are effective against COVID-19. Your health care provider will talk with you about ways to treat your symptoms. For most people, the infection is mild and can be managed at home with rest, fluids, and over-the-counter medicines. Treatment for a serious infection usually takes places in a hospital intensive care unit (ICU). It may include one or more of the following treatments. These  treatments are given until your symptoms improve.  Receiving fluids and medicines through an IV.  Supplemental oxygen. Extra oxygen is given through a tube in the nose, a face mask, or a hood.  Positioning you to lie on your stomach (prone position). This makes it easier for oxygen to get into the lungs.  Continuous positive airway pressure (CPAP) or bi-level positive airway pressure (BPAP) machine. This treatment uses mild air pressure to keep the airways open. A tube that is connected to a motor delivers oxygen to the body.  Ventilator. This treatment moves air into and out of the lungs by using a tube that is placed in your windpipe.  Tracheostomy. This is a procedure to create a hole in the neck so that a breathing tube can be inserted.  Extracorporeal membrane oxygenation (ECMO). This procedure gives the lungs a chance to recover by taking over the functions of the heart and lungs. It supplies oxygen to the body and removes carbon dioxide. Follow these instructions at home: Lifestyle  If you are sick, stay home except to get medical care. Your health care provider  will tell you how long to stay home. Call your health care provider before you go for medical care.  Rest at home as told by your health care provider.  Do not use any products that contain nicotine or tobacco, such as cigarettes, e-cigarettes, and chewing tobacco. If you need help quitting, ask your health care provider.  Return to your normal activities as told by your health care provider. Ask your health care provider what activities are safe for you. General instructions  Take over-the-counter and prescription medicines only as told by your health care provider.  Drink enough fluid to keep your urine pale yellow.  Keep all follow-up visits as told by your health care provider. This is important. How is this prevented?  There is no vaccine to help prevent COVID-19 infection. However, there are steps you can take  to protect yourself and others from this virus. To protect yourself:   Do not travel to areas where COVID-19 is a risk. The areas where COVID-19 is reported change often. To identify high-risk areas and travel restrictions, check the CDC travel website: StageSync.si  If you live in, or must travel to, an area where COVID-19 is a risk, take precautions to avoid infection. ? Stay away from people who are sick. ? Wash your hands often with soap and water for 20 seconds. If soap and water are not available, use an alcohol-based hand sanitizer. ? Avoid touching your mouth, face, eyes, or nose. ? Avoid going out in public, follow guidance from your state and local health authorities. ? If you must go out in public, wear a cloth face covering or face mask. Make sure your mask covers your nose and mouth. ? Avoid crowded indoor spaces. Stay at least 6 feet (2 meters) away from others. ? Disinfect objects and surfaces that are frequently touched every day. This may include:  Counters and tables.  Doorknobs and light switches.  Sinks and faucets.  Electronics, such as phones, remote controls, keyboards, computers, and tablets. To protect others: If you have symptoms of COVID-19, take steps to prevent the virus from spreading to others.  If you think you have a COVID-19 infection, contact your health care provider right away. Tell your health care team that you think you may have a COVID-19 infection.  Stay home. Leave your house only to seek medical care. Do not use public transport.  Do not travel while you are sick.  Wash your hands often with soap and water for 20 seconds. If soap and water are not available, use alcohol-based hand sanitizer.  Stay away from other members of your household. Let healthy household members care for children and pets, if possible. If you have to care for children or pets, wash your hands often and wear a mask. If possible, stay in your own room,  separate from others. Use a different bathroom.  Make sure that all people in your household wash their hands well and often.  Cough or sneeze into a tissue or your sleeve or elbow. Do not cough or sneeze into your hand or into the air.  Wear a cloth face covering or face mask. Make sure your mask covers your nose and mouth. Where to find more information  Centers for Disease Control and Prevention: StickerEmporium.tn  World Health Organization: https://thompson-craig.com/ Contact a health care provider if:  You live in or have traveled to an area where COVID-19 is a risk and you have symptoms of the infection.  You have had contact  with someone who has COVID-19 and you have symptoms of the infection. Get help right away if:  You have trouble breathing.  You have pain or pressure in your chest.  You have confusion.  You have bluish lips and fingernails.  You have difficulty waking from sleep.  You have symptoms that get worse. These symptoms may represent a serious problem that is an emergency. Do not wait to see if the symptoms will go away. Get medical help right away. Call your local emergency services (911 in the [place] Do not drive yourself to the hospital. Let the emergency medical personnel know if you think you have COVID-19. Summary  COVID-19 is a respiratory infection that is caused by a virus. It is also known as coronavirus disease or novel coronavirus. It can cause serious infections, such as pneumonia, acute respiratory distress syndrome, acute respiratory failure, or sepsis.  The virus that causes COVID-19 is contagious. This means that it can spread from person to person through droplets from breathing, speaking, singing, coughing, or sneezing.  You are more likely to develop a serious illness if you are 20 years of age or older, have a weak immune system, live in a nursing home, or have chronic disease.  There is no medicine  to treat COVID-19. Your health care provider will talk with you about ways to treat your symptoms.  Take steps to protect yourself and others from infection. Wash your hands often and disinfect objects and surfaces that are frequently touched every day. Stay away from people who are sick and wear a mask if you are sick. This information is not intended to replace advice given to you by your health care provider. Make sure you discuss any questions you have with your health care provider. Document Revised: 08/28/2019 Document Reviewed: 12/04/2018 Elsevier Patient Education  2020 [employer].

## 2020-02-11 NOTE — Progress Notes (Signed)
Discharge paperwork prepared and education pt of discharge instructions. Report called to Joey at [facility]. [facility] on unit to transport pt at this time. CSW informed that she will notify pt's daughter of transfer.

## 2020-02-11 NOTE — TOC Progression Note (Signed)
Transition of Care [facility]) - Progression Note    Patient Details  Name: [name] MRN: [id number] Date of Birth: 11-01-1933  Transition of Care [facility]) CM/SW Contact  Mearl Latin, LCSW Phone Number: 02/11/2020, 9:50 AM  Clinical Narrative:    [name] has insurance approval for patient to discharge today.    Expected Discharge Plan: Skilled Nursing Facility Barriers to Discharge: Insurance Authorization  Expected Discharge Plan and Services Expected Discharge Plan: Skilled Nursing Facility In-house Referral: Clinical Social Work   Post Acute Care Choice: Skilled Nursing Facility Living arrangements for the past 2 months: Single Family Home Expected Discharge Date: 02/10/20                                     Social Determinants of Health (SDOH) Interventions    Readmission Risk Interventions Readmission Risk Prevention Plan 02/08/2020  Transportation Screening Complete  Medication Review [facility]) Complete  PCP or Specialist appointment within 3-5 days of discharge Complete  HRI or Home Care Consult Complete  SW Recovery Care/Counseling Consult Complete  Palliative Care Screening Not Applicable  Skilled Nursing Facility Complete  Some recent data might be hidden

## 2020-02-12 DIAGNOSIS — R5381 Other malaise: Secondary | ICD-10-CM | POA: Diagnosis not present

## 2020-02-12 DIAGNOSIS — E860 Dehydration: Secondary | ICD-10-CM | POA: Diagnosis not present

## 2020-02-12 DIAGNOSIS — J1282 Pneumonia due to coronavirus disease 2019: Secondary | ICD-10-CM | POA: Diagnosis not present

## 2020-02-12 DIAGNOSIS — U071 COVID-19: Secondary | ICD-10-CM | POA: Diagnosis not present

## 2020-02-15 ENCOUNTER — Other Ambulatory Visit: Payer: Self-pay

## 2020-02-15 DIAGNOSIS — R5381 Other malaise: Secondary | ICD-10-CM | POA: Diagnosis not present

## 2020-02-15 DIAGNOSIS — U071 COVID-19: Secondary | ICD-10-CM | POA: Diagnosis not present

## 2020-02-15 DIAGNOSIS — E119 Type 2 diabetes mellitus without complications: Secondary | ICD-10-CM | POA: Diagnosis not present

## 2020-02-15 DIAGNOSIS — J1282 Pneumonia due to coronavirus disease 2019: Secondary | ICD-10-CM | POA: Diagnosis not present

## 2020-02-15 NOTE — Patient Outreach (Signed)
[facility]) Care Management  02/15/2020  [name] [birth year] [id number]     Transition of Care Referral  Referral Date: 02/15/2020 Referral Source: [facility] Discharge Report Date of Discharge: 02/11/2020 Facility: [facility] Insurance: [facility] Medicare    Referral received. Transition of care calls being completed via EMMI-automated calls. RN CM will outreach patient for any red flags received.     Plan: RN CM will close case at this time.    [name], RN,BSN,CCM [facility] Care Management Telephonic Care Management Coordinator Direct Phone: [phone] Toll Free: [phone] Fax: [phone]

## 2020-02-17 DIAGNOSIS — E871 Hypo-osmolality and hyponatremia: Secondary | ICD-10-CM | POA: Diagnosis not present

## 2020-02-17 DIAGNOSIS — D72829 Elevated white blood cell count, unspecified: Secondary | ICD-10-CM | POA: Diagnosis not present

## 2020-02-17 DIAGNOSIS — E119 Type 2 diabetes mellitus without complications: Secondary | ICD-10-CM | POA: Diagnosis not present

## 2020-02-17 DIAGNOSIS — K5900 Constipation, unspecified: Secondary | ICD-10-CM | POA: Diagnosis not present

## 2020-02-18 DIAGNOSIS — I517 Cardiomegaly: Secondary | ICD-10-CM | POA: Diagnosis not present

## 2020-02-18 DIAGNOSIS — J189 Pneumonia, unspecified organism: Secondary | ICD-10-CM | POA: Diagnosis not present

## 2020-02-22 DIAGNOSIS — F4321 Adjustment disorder with depressed mood: Secondary | ICD-10-CM | POA: Diagnosis not present

## 2020-02-24 DIAGNOSIS — M19071 Primary osteoarthritis, right ankle and foot: Secondary | ICD-10-CM | POA: Diagnosis not present

## 2020-02-24 DIAGNOSIS — E119 Type 2 diabetes mellitus without complications: Secondary | ICD-10-CM | POA: Diagnosis not present

## 2020-02-24 DIAGNOSIS — M19072 Primary osteoarthritis, left ankle and foot: Secondary | ICD-10-CM | POA: Diagnosis not present

## 2020-02-24 DIAGNOSIS — K5900 Constipation, unspecified: Secondary | ICD-10-CM | POA: Diagnosis not present

## 2020-03-03 DIAGNOSIS — E871 Hypo-osmolality and hyponatremia: Secondary | ICD-10-CM | POA: Diagnosis not present

## 2020-03-03 DIAGNOSIS — E872 Acidosis: Secondary | ICD-10-CM | POA: Diagnosis not present

## 2020-03-03 DIAGNOSIS — E860 Dehydration: Secondary | ICD-10-CM | POA: Diagnosis not present

## 2020-03-03 DIAGNOSIS — R197 Diarrhea, unspecified: Secondary | ICD-10-CM | POA: Diagnosis not present

## 2020-03-04 DIAGNOSIS — E871 Hypo-osmolality and hyponatremia: Secondary | ICD-10-CM | POA: Diagnosis not present

## 2020-03-04 DIAGNOSIS — J189 Pneumonia, unspecified organism: Secondary | ICD-10-CM | POA: Diagnosis not present

## 2020-03-04 DIAGNOSIS — R197 Diarrhea, unspecified: Secondary | ICD-10-CM | POA: Diagnosis not present

## 2020-03-04 DIAGNOSIS — E872 Acidosis: Secondary | ICD-10-CM | POA: Diagnosis not present

## 2020-03-05 NOTE — Progress Notes (Deleted)
Cardiology Office Note   Date:  03/05/2020   ID:  [name], DOB 10/14/1933, MRN [id number]  PCP:  Tresa Garter, MD  Cardiologist:  Dr.Hochrein  No chief complaint on file.    History of Present Illness: [name] is a 86 y.o. female who presents for ongoing assessment and management of CAD, with history of cardiac catheterization revealing widely patent previously placed stent with culprit lesion felt to be an ulcerated thrombotic irregular proximal to mid RCA lesion.  She underwent successful but complex PCI/DES of the mid RCA  10/17/2019.  She was placed on dual antiplatelet therapy with aspirin and Plavix for minimum 1 year.  She had a follow-up echocardiogram which revealed an EF of 60% to 65% with normal LV function and grade 1 diastolic dysfunction with mildly dilated atria. She was also placed on high-dose statin, atorvastatin 40 mg at at bedtime, continued on her home dose of carvedilol 25 mg twice daily. She was noted to have some transient confusion during her hospitalization, but had returned to normal at discharge.  On last office visit with Dr. Antoine Poche on 01/29/2020 she was complaining of palpitations which normally occurred at night.  She felt her heart racing once and she did take a sublingual nitroglycerin.  She states that she feels these a couple of times a day when she is lying down or in a certain position while lying down.  She denied any chest pain shortness of breath neck or arm discomfort associated with this.  She is not very ambulatory and gets around slowly with a walker, and uses a lift chair to get her up stairs.  At the time of the last office visit she was clinically stable.  A 2-week Zio cardiac monitor was placed to evaluate frequency and morphology of her palpitations.  At the time of this office visit monitor has not been completed   Past Medical History:  Diagnosis Date  . Anemia    iron deficiency  . Aneurysm, thoracic aortic (HCC)     . Anxiety   . B12 deficiency   . CAD (coronary artery disease)    s/p stenting of LAD 1999- cath 5-08 EF normal LAD 30-40% restenosis. D1 50% D2 80% LCX & RCA minimal plaque  . Chronic back pain   . Constipation   . Depression   . Diabetes mellitus   . DVT (deep venous thrombosis) (HCC)   . GERD (gastroesophageal reflux disease)   . Gout   . HTN (hypertension)   . Hyperlipemia   . Obesity   . Osteoporosis   . Pancreatitis   . Polyarthritis    DJD/ possible PMR  . Renal insufficiency    Cr 1.2-1.3  . Seizures (HCC)   . Tinnitus   . Urinary frequency   . Vertigo   . Vitamin D deficiency     Past Surgical History:  Procedure Laterality Date  . ABDOMINAL HYSTERECTOMY    . CARDIAC CATHETERIZATION  2008   L main 20%, LAD stent patent, D1 50%, D2 80% (small), RCA 20%, EF 55-60%  . CHOLECYSTECTOMY    . CORONARY ANGIOPLASTY WITH STENT PLACEMENT  1999   LAD stent  . CORONARY STENT INTERVENTION N/A 10/16/2019   Procedure: CORONARY STENT INTERVENTION;  Surgeon: Marykay Lex, MD;  Location: [facility] INVASIVE CV LAB;  Service: Cardiovascular;  Laterality: N/A;  . HEMORRHOID SURGERY    . LAPAROSCOPIC LYSIS OF ADHESIONS N/A 03/24/2015   Procedure: LAPAROSCOPIC LYSIS OF ADHESIONS;  Surgeon: Luretha Murphy, MD;  Location: [facility] ORS;  Service: General;  Laterality: N/A;  . LAPAROSCOPY N/A 03/24/2015   Procedure: LAPAROSCOPY DIAGNOSTIC;  Surgeon: Luretha Murphy, MD;  Location: [facility] ORS;  Service: General;  Laterality: N/A;  . LAPAROTOMY N/A 03/24/2015   Procedure: LAPAROTOMY with decompression of bowel;  Surgeon: Luretha Murphy, MD;  Location: [facility] ORS;  Service: General;  Laterality: N/A;  . LEFT HEART CATH AND CORONARY ANGIOGRAPHY N/A 10/16/2019   Procedure: LEFT HEART CATH AND CORONARY ANGIOGRAPHY;  Surgeon: Marykay Lex, MD;  Location[facility] INVASIVE CV LAB;  Service: Cardiovascular;  Laterality: N/A;  . TUBAL LIGATION       Current Outpatient Medications  Medication Sig Dispense Refill  .  acetaminophen (TYLENOL) 650 MG CR tablet Take 1,300 mg by mouth every 8 (eight) hours as needed for pain.    . Alcohol Swabs (B-D SINGLE USE SWABS REGULAR) PADS Use to clean area to check blood sugars twice a day 300 each 1  . amLODipine (NORVASC) 10 MG tablet Take 1 tablet (10 mg total) by mouth daily.    [name] aspirin EC 325 MG tablet Take 325 mg by mouth daily.    . bisacodyl (BISACODYL) 5 MG EC tablet Take 5-10 mg by mouth daily as needed for moderate constipation.    . Blood Glucose Calibration (ACCU-CHEK AVIVA) SOLN Use as directed (Patient taking differently: 1 each by Other route See admin instructions. Use as directed per physician) 3 each 1  . brimonidine (ALPHAGAN) 0.2 % ophthalmic solution Place 1 drop into both eyes 2 (two) times daily.    . carvedilol (COREG) 25 MG tablet TAKE 1 TABLET BY MOUTH TWICE DAILY WITH MEALS (Patient taking differently: Take 25 mg by mouth 2 (two) times daily with a meal. ) 14 tablet 0  . cholecalciferol (VITAMIN D) 25 MCG (1000 UNIT) tablet Take 1,000 Units by mouth daily.    . clopidogrel (PLAVIX) 75 MG tablet Take 1 tablet (75 mg total) by mouth daily. 90 tablet 3  . Cyanocobalamin (VITAMIN B-12) 3000 MCG SUBL Place 3,000 mcg under the tongue daily.    . feeding supplement, GLUCERNA SHAKE, (GLUCERNA SHAKE) LIQD Take 237 mLs by mouth 3 (three) times daily between meals.  0  . glucose blood (ONETOUCH VERIO) test strip Use as instructed (Patient taking differently: 1 each by Other route See admin instructions. Use as instructed for blood sugar) 300 each 3  . guaiFENesin-dextromethorphan (ROBITUSSIN DM) 100-10 MG/5ML syrup Take 10 mLs by mouth every 4 (four) hours as needed for cough. 118 mL 0  . levETIRAcetam (KEPPRA) 500 MG tablet TAKE 1 TABLET TWICE DAILY (Patient taking differently: Take 500 mg by mouth at bedtime. ) 180 tablet 1  . nitroGLYCERIN (NITROSTAT) 0.4 MG SL tablet Place 1 tablet (0.4 mg total) under the tongue every 5 (five) minutes x 3 doses as needed  for chest pain. 25 tablet 3  . ondansetron (ZOFRAN) 4 MG tablet TAKE 1 TABLET BY MOUTH TWICE DAILY AS NEEDED FOR NAUSEA FOR VOMITING (Patient taking differently: Take 4 mg by mouth 2 (two) times daily as needed for nausea or vomiting. ) 30 tablet 0  . ONETOUCH DELICA LANCETS FINE MISC 1 Device by Does not apply route daily as needed. (Patient taking differently: 1 Device by Does not apply route daily as needed (blood glucose). ) 100 each 3  . pantoprazole (PROTONIX) 40 MG tablet Take 1 tablet (40 mg total) by mouth daily. 30 tablet 0  . predniSONE (DELTASONE)  5 MG tablet Take 4 Pills PO for 3 days, 2 Pills PO for 3 days, 1 Pills PO for 3 days scheduled, after that patient chronically takes 5 mg of prednisone daily as needed as needed for weakness and lack of energy.  Continue as needed 5 mg prednisone on a daily basis as before. 65 tablet 0  . rosuvastatin (CRESTOR) 20 MG tablet Take 1 tablet (20 mg total) by mouth daily. 90 tablet 3   No current facility-administered medications for this visit.    Allergies:   Ativan [lorazepam], Tramadol hcl, Amlodipine besylate, Atenolol, Benazepril, Benicar [olmesartan medoxomil], Cozaar, Hydralazine, Hydrochlorothiazide w-triamterene, Hydrocodone, Hydroxyzine pamoate, Iodine, Lisinopril, Peach flavor, Penicillins, Pravastatin, Prednisolone, Strawberry flavor, Tramadol hcl, and Benadryl [diphenhydramine]    Social History:  The patient  reports that she has never smoked. She has never used smokeless tobacco. She reports that she does not drink alcohol or use drugs.   Family History:  The patient's family history includes Diabetes in her mother; Hypertension in her father. She was adopted.    ROS: All other systems are reviewed and negative. Unless otherwise mentioned in H&P    PHYSICAL EXAM: VS:  There were no vitals taken for this visit. , BMI There is no height or weight on file to calculate BMI. GEN: Well nourished, well developed, in no acute  distress HEENT: normal Neck: no JVD, carotid bruits, or masses Cardiac: ***RRR; no murmurs, rubs, or gallops,no edema  Respiratory:  Clear to auscultation bilaterally, normal work of breathing GI: soft, nontender, nondistended, + BS MS: no deformity or atrophy Skin: warm and dry, no rash Neuro:  Strength and sensation are intact Psych: euthymic mood, full affect   EKG:  EKG {ACTION; IS/IS [id number]} ordered today. The ekg ordered today demonstrates ***   Recent Labs: 02/10/2020: ALT 20; B Natriuretic Peptide 304.9; BUN 47; Creatinine, Ser 1.36; Hemoglobin 9.0; Magnesium 1.9; Platelets 316; Potassium 4.4; Sodium 134    Lipid Panel    Component Value Date/Time   CHOL 169 06/25/2015 0410   TRIG 61 06/25/2015 0410   HDL 57 06/25/2015 0410   CHOLHDL 3.0 06/25/2015 0410   VLDL 12 06/25/2015 0410   LDLCALC 100 (H) 06/25/2015 0410   LDLDIRECT 114.8 01/10/2011 0923      Wt Readings from Last 3 Encounters:  02/07/20 167 lb 12.3 oz (76.1 kg)  01/29/20 178 lb 6.4 oz (80.9 kg)  11/02/19 169 lb 3.2 oz (76.7 kg)      Other studies Reviewed: Echocardiogram Oct 29, 2019 1. Left ventricular ejection fraction, by visual estimation, is 60 to  65%. The left ventricle has normal function. There is no left ventricular  hypertrophy.  2. Left ventricular diastolic parameters are consistent with Grade I  diastolic dysfunction (impaired relaxation).  3. Global right ventricle has normal systolic function.The right  ventricular size is mildly enlarged. No increase in right ventricular wall  thickness.  4. Left atrial size was mildly dilated.  5. Right atrial size was mildly dilated.  6. Mild to moderate mitral annular calcification.  7. Moderate thickening of the mitral valve leaflet(s).  8. The mitral valve is normal in structure. Mild to moderate mitral valve  regurgitation. No evidence of mitral stenosis.  9. The tricuspid valve is normal in structure. Tricuspid valve   regurgitation moderate.  10. The aortic valve is normal in structure. Aortic valve regurgitation is  trivial. No evidence of aortic valve sclerosis or stenosis.  11. The pulmonic valve was normal in structure. Pulmonic valve  regurgitation is not visualized.  12. Mildly elevated pulmonary artery systolic pressure.  13. The inferior vena cava is normal in size with greater than 50%  respiratory variability, suggesting right atrial pressure of 3 mmHg.   LHC 10/16/2019   Prox RCA-1 lesion is 20% stenosed.  CULPRIT LESION Prox RCA-2 lesion is 80% stenosed-prox RCA to Mid RCA lesion is 95% stenosed.  A drug-eluting stent was successfully placed covering both lesions, using a STENT RESOLUTE ONYX 3.5X34. Postdilated from 3.9 to 3.8 mm  Post intervention, there is a 0% residual stenosis.  Dist RCA lesion is 30% stenosed.  ---  Mid LAD to Dist LAD lesion is 20% stenosed-in the distal portion of the stent from 1999. 1st Diag lesion is 50% stenosed.  Ramus lesion is 30% stenosed.  ---  There is mild left ventricular systolic dysfunction. The left ventricular ejection fraction is 45-50% by visual estimate. Distal inferior-inferoapical hypokinesis  LV end diastolic pressure is normal.     ASSESSMENT AND PLAN:  1.  ***   Current medicines are reviewed at length with the patient today.  I have spent *** dedicated to the care of this patient on the date of this encounter to include pre-visit review of records, assessment, management and diagnostic testing,with shared decision making.  Labs/ tests ordered today include: *** Bettey Mare. Liborio Nixon, ANP, AACC   03/05/2020 4:19 PM    [facility] [address] Office [phone] Fax [phone]  Notice: This dictation was prepared with Dragon dictation along with smaller phrase technology. Any transcriptional errors that result from this process are unintentional and may not be corrected upon  review.

## 2020-03-07 ENCOUNTER — Ambulatory Visit: Payer: Medicare HMO | Admitting: Adult Health

## 2020-03-07 DIAGNOSIS — F4321 Adjustment disorder with depressed mood: Secondary | ICD-10-CM | POA: Diagnosis not present

## 2020-03-08 DIAGNOSIS — R5381 Other malaise: Secondary | ICD-10-CM | POA: Diagnosis not present

## 2020-03-08 DIAGNOSIS — U071 COVID-19: Secondary | ICD-10-CM | POA: Diagnosis not present

## 2020-03-08 DIAGNOSIS — E860 Dehydration: Secondary | ICD-10-CM | POA: Diagnosis not present

## 2020-03-08 DIAGNOSIS — J189 Pneumonia, unspecified organism: Secondary | ICD-10-CM | POA: Diagnosis not present

## 2020-03-11 DIAGNOSIS — M159 Polyosteoarthritis, unspecified: Secondary | ICD-10-CM | POA: Diagnosis not present

## 2020-03-11 DIAGNOSIS — Z87898 Personal history of other specified conditions: Secondary | ICD-10-CM | POA: Diagnosis not present

## 2020-03-11 DIAGNOSIS — E860 Dehydration: Secondary | ICD-10-CM | POA: Diagnosis not present

## 2020-03-11 DIAGNOSIS — Z7952 Long term (current) use of systemic steroids: Secondary | ICD-10-CM | POA: Diagnosis not present

## 2020-03-14 DIAGNOSIS — I10 Essential (primary) hypertension: Secondary | ICD-10-CM | POA: Diagnosis not present

## 2020-03-14 DIAGNOSIS — J189 Pneumonia, unspecified organism: Secondary | ICD-10-CM | POA: Diagnosis not present

## 2020-03-14 DIAGNOSIS — E860 Dehydration: Secondary | ICD-10-CM | POA: Diagnosis not present

## 2020-03-16 DIAGNOSIS — E860 Dehydration: Secondary | ICD-10-CM | POA: Diagnosis not present

## 2020-03-16 DIAGNOSIS — E872 Acidosis: Secondary | ICD-10-CM | POA: Diagnosis not present

## 2020-03-16 DIAGNOSIS — K5900 Constipation, unspecified: Secondary | ICD-10-CM | POA: Diagnosis not present

## 2020-03-18 DIAGNOSIS — R5381 Other malaise: Secondary | ICD-10-CM | POA: Diagnosis not present

## 2020-03-18 DIAGNOSIS — U071 COVID-19: Secondary | ICD-10-CM | POA: Diagnosis not present

## 2020-03-18 DIAGNOSIS — J1282 Pneumonia due to coronavirus disease 2019: Secondary | ICD-10-CM | POA: Diagnosis not present

## 2020-03-18 DIAGNOSIS — K5900 Constipation, unspecified: Secondary | ICD-10-CM | POA: Diagnosis not present

## 2020-03-21 DIAGNOSIS — F4321 Adjustment disorder with depressed mood: Secondary | ICD-10-CM | POA: Diagnosis not present

## 2020-03-21 DIAGNOSIS — M159 Polyosteoarthritis, unspecified: Secondary | ICD-10-CM | POA: Diagnosis not present

## 2020-03-21 DIAGNOSIS — J189 Pneumonia, unspecified organism: Secondary | ICD-10-CM | POA: Diagnosis not present

## 2020-03-21 DIAGNOSIS — M6281 Muscle weakness (generalized): Secondary | ICD-10-CM | POA: Diagnosis not present

## 2020-03-21 DIAGNOSIS — M2550 Pain in unspecified joint: Secondary | ICD-10-CM | POA: Diagnosis not present

## 2020-03-21 DIAGNOSIS — R2681 Unsteadiness on feet: Secondary | ICD-10-CM | POA: Diagnosis not present

## 2020-03-21 DIAGNOSIS — I10 Essential (primary) hypertension: Secondary | ICD-10-CM | POA: Diagnosis not present

## 2020-03-21 DIAGNOSIS — E119 Type 2 diabetes mellitus without complications: Secondary | ICD-10-CM | POA: Diagnosis not present

## 2020-03-21 DIAGNOSIS — R5381 Other malaise: Secondary | ICD-10-CM | POA: Diagnosis not present

## 2020-03-21 DIAGNOSIS — K5900 Constipation, unspecified: Secondary | ICD-10-CM | POA: Diagnosis not present

## 2020-03-22 DIAGNOSIS — M6281 Muscle weakness (generalized): Secondary | ICD-10-CM | POA: Diagnosis not present

## 2020-03-22 DIAGNOSIS — R2681 Unsteadiness on feet: Secondary | ICD-10-CM | POA: Diagnosis not present

## 2020-03-22 DIAGNOSIS — I10 Essential (primary) hypertension: Secondary | ICD-10-CM | POA: Diagnosis not present

## 2020-03-22 DIAGNOSIS — M2550 Pain in unspecified joint: Secondary | ICD-10-CM | POA: Diagnosis not present

## 2020-03-22 DIAGNOSIS — E119 Type 2 diabetes mellitus without complications: Secondary | ICD-10-CM | POA: Diagnosis not present

## 2020-03-23 ENCOUNTER — Other Ambulatory Visit: Payer: Self-pay

## 2020-03-23 NOTE — Patient Outreach (Signed)
[facility]) Care Management  03/23/2020  ALBERTHA BEATTIE 08-17-1933 [id number]   Referral Date: 03/23/20 Referral Source: [employer] Report Date of Discharge:03/20/20 [name] Insurance: [facility]  Referral received.  No outreach warranted at this time.  Transition of Care calls being completed via EMMI. RN CM will outreach patient for any red flags received.    Plan: RN CM will close case.    Bary Leriche, RN, MSN [facility] Care Management Care Management Coordinator Direct Line [phone] Toll Free: [phone]  Fax: [phone]

## 2020-03-24 DIAGNOSIS — U071 COVID-19: Secondary | ICD-10-CM | POA: Diagnosis not present

## 2020-03-24 DIAGNOSIS — R5381 Other malaise: Secondary | ICD-10-CM | POA: Diagnosis not present

## 2020-03-24 DIAGNOSIS — I2589 Other forms of chronic ischemic heart disease: Secondary | ICD-10-CM | POA: Diagnosis not present

## 2020-03-24 DIAGNOSIS — J189 Pneumonia, unspecified organism: Secondary | ICD-10-CM | POA: Diagnosis not present

## 2020-03-24 DIAGNOSIS — J1282 Pneumonia due to coronavirus disease 2019: Secondary | ICD-10-CM | POA: Diagnosis not present

## 2020-03-27 DIAGNOSIS — F329 Major depressive disorder, single episode, unspecified: Secondary | ICD-10-CM | POA: Diagnosis not present

## 2020-03-27 DIAGNOSIS — M810 Age-related osteoporosis without current pathological fracture: Secondary | ICD-10-CM | POA: Diagnosis not present

## 2020-03-27 DIAGNOSIS — F419 Anxiety disorder, unspecified: Secondary | ICD-10-CM | POA: Diagnosis not present

## 2020-03-27 DIAGNOSIS — M159 Polyosteoarthritis, unspecified: Secondary | ICD-10-CM | POA: Diagnosis not present

## 2020-03-27 DIAGNOSIS — I2510 Atherosclerotic heart disease of native coronary artery without angina pectoris: Secondary | ICD-10-CM | POA: Diagnosis not present

## 2020-03-27 DIAGNOSIS — I10 Essential (primary) hypertension: Secondary | ICD-10-CM | POA: Diagnosis not present

## 2020-03-27 DIAGNOSIS — E119 Type 2 diabetes mellitus without complications: Secondary | ICD-10-CM | POA: Diagnosis not present

## 2020-03-27 DIAGNOSIS — M109 Gout, unspecified: Secondary | ICD-10-CM | POA: Diagnosis not present

## 2020-03-27 DIAGNOSIS — G8929 Other chronic pain: Secondary | ICD-10-CM | POA: Diagnosis not present

## 2020-03-28 DIAGNOSIS — I10 Essential (primary) hypertension: Secondary | ICD-10-CM | POA: Diagnosis not present

## 2020-03-28 DIAGNOSIS — F419 Anxiety disorder, unspecified: Secondary | ICD-10-CM | POA: Diagnosis not present

## 2020-03-28 DIAGNOSIS — M159 Polyosteoarthritis, unspecified: Secondary | ICD-10-CM | POA: Diagnosis not present

## 2020-03-28 DIAGNOSIS — M810 Age-related osteoporosis without current pathological fracture: Secondary | ICD-10-CM | POA: Diagnosis not present

## 2020-03-28 DIAGNOSIS — G8929 Other chronic pain: Secondary | ICD-10-CM | POA: Diagnosis not present

## 2020-03-28 DIAGNOSIS — M109 Gout, unspecified: Secondary | ICD-10-CM | POA: Diagnosis not present

## 2020-03-28 DIAGNOSIS — E119 Type 2 diabetes mellitus without complications: Secondary | ICD-10-CM | POA: Diagnosis not present

## 2020-03-28 DIAGNOSIS — I2510 Atherosclerotic heart disease of native coronary artery without angina pectoris: Secondary | ICD-10-CM | POA: Diagnosis not present

## 2020-03-28 DIAGNOSIS — F329 Major depressive disorder, single episode, unspecified: Secondary | ICD-10-CM | POA: Diagnosis not present

## 2020-03-29 ENCOUNTER — Inpatient Hospital Stay: Payer: Medicare HMO | Admitting: Internal Medicine

## 2020-03-29 DIAGNOSIS — I10 Essential (primary) hypertension: Secondary | ICD-10-CM | POA: Diagnosis not present

## 2020-03-29 DIAGNOSIS — F419 Anxiety disorder, unspecified: Secondary | ICD-10-CM | POA: Diagnosis not present

## 2020-03-29 DIAGNOSIS — I2510 Atherosclerotic heart disease of native coronary artery without angina pectoris: Secondary | ICD-10-CM | POA: Diagnosis not present

## 2020-03-29 DIAGNOSIS — M159 Polyosteoarthritis, unspecified: Secondary | ICD-10-CM | POA: Diagnosis not present

## 2020-03-29 DIAGNOSIS — F329 Major depressive disorder, single episode, unspecified: Secondary | ICD-10-CM | POA: Diagnosis not present

## 2020-03-29 DIAGNOSIS — G8929 Other chronic pain: Secondary | ICD-10-CM | POA: Diagnosis not present

## 2020-03-29 DIAGNOSIS — M109 Gout, unspecified: Secondary | ICD-10-CM | POA: Diagnosis not present

## 2020-03-29 DIAGNOSIS — E119 Type 2 diabetes mellitus without complications: Secondary | ICD-10-CM | POA: Diagnosis not present

## 2020-03-29 DIAGNOSIS — M810 Age-related osteoporosis without current pathological fracture: Secondary | ICD-10-CM | POA: Diagnosis not present

## 2020-04-01 DIAGNOSIS — G8929 Other chronic pain: Secondary | ICD-10-CM | POA: Diagnosis not present

## 2020-04-01 DIAGNOSIS — F419 Anxiety disorder, unspecified: Secondary | ICD-10-CM | POA: Diagnosis not present

## 2020-04-01 DIAGNOSIS — E119 Type 2 diabetes mellitus without complications: Secondary | ICD-10-CM | POA: Diagnosis not present

## 2020-04-01 DIAGNOSIS — I10 Essential (primary) hypertension: Secondary | ICD-10-CM | POA: Diagnosis not present

## 2020-04-01 DIAGNOSIS — M159 Polyosteoarthritis, unspecified: Secondary | ICD-10-CM | POA: Diagnosis not present

## 2020-04-01 DIAGNOSIS — M810 Age-related osteoporosis without current pathological fracture: Secondary | ICD-10-CM | POA: Diagnosis not present

## 2020-04-01 DIAGNOSIS — F329 Major depressive disorder, single episode, unspecified: Secondary | ICD-10-CM | POA: Diagnosis not present

## 2020-04-01 DIAGNOSIS — M109 Gout, unspecified: Secondary | ICD-10-CM | POA: Diagnosis not present

## 2020-04-01 DIAGNOSIS — I2510 Atherosclerotic heart disease of native coronary artery without angina pectoris: Secondary | ICD-10-CM | POA: Diagnosis not present

## 2020-04-04 DIAGNOSIS — I10 Essential (primary) hypertension: Secondary | ICD-10-CM | POA: Diagnosis not present

## 2020-04-04 DIAGNOSIS — G8929 Other chronic pain: Secondary | ICD-10-CM | POA: Diagnosis not present

## 2020-04-04 DIAGNOSIS — M109 Gout, unspecified: Secondary | ICD-10-CM | POA: Diagnosis not present

## 2020-04-04 DIAGNOSIS — M159 Polyosteoarthritis, unspecified: Secondary | ICD-10-CM | POA: Diagnosis not present

## 2020-04-04 DIAGNOSIS — F419 Anxiety disorder, unspecified: Secondary | ICD-10-CM | POA: Diagnosis not present

## 2020-04-04 DIAGNOSIS — F329 Major depressive disorder, single episode, unspecified: Secondary | ICD-10-CM | POA: Diagnosis not present

## 2020-04-04 DIAGNOSIS — I2510 Atherosclerotic heart disease of native coronary artery without angina pectoris: Secondary | ICD-10-CM | POA: Diagnosis not present

## 2020-04-04 DIAGNOSIS — E119 Type 2 diabetes mellitus without complications: Secondary | ICD-10-CM | POA: Diagnosis not present

## 2020-04-04 DIAGNOSIS — M810 Age-related osteoporosis without current pathological fracture: Secondary | ICD-10-CM | POA: Diagnosis not present

## 2020-04-06 DIAGNOSIS — M109 Gout, unspecified: Secondary | ICD-10-CM | POA: Diagnosis not present

## 2020-04-06 DIAGNOSIS — E119 Type 2 diabetes mellitus without complications: Secondary | ICD-10-CM | POA: Diagnosis not present

## 2020-04-06 DIAGNOSIS — I2510 Atherosclerotic heart disease of native coronary artery without angina pectoris: Secondary | ICD-10-CM | POA: Diagnosis not present

## 2020-04-06 DIAGNOSIS — F419 Anxiety disorder, unspecified: Secondary | ICD-10-CM | POA: Diagnosis not present

## 2020-04-06 DIAGNOSIS — F329 Major depressive disorder, single episode, unspecified: Secondary | ICD-10-CM | POA: Diagnosis not present

## 2020-04-06 DIAGNOSIS — M159 Polyosteoarthritis, unspecified: Secondary | ICD-10-CM | POA: Diagnosis not present

## 2020-04-06 DIAGNOSIS — I10 Essential (primary) hypertension: Secondary | ICD-10-CM | POA: Diagnosis not present

## 2020-04-06 DIAGNOSIS — G8929 Other chronic pain: Secondary | ICD-10-CM | POA: Diagnosis not present

## 2020-04-06 DIAGNOSIS — M810 Age-related osteoporosis without current pathological fracture: Secondary | ICD-10-CM | POA: Diagnosis not present

## 2020-04-07 DIAGNOSIS — I10 Essential (primary) hypertension: Secondary | ICD-10-CM | POA: Diagnosis not present

## 2020-04-07 DIAGNOSIS — F419 Anxiety disorder, unspecified: Secondary | ICD-10-CM | POA: Diagnosis not present

## 2020-04-07 DIAGNOSIS — M159 Polyosteoarthritis, unspecified: Secondary | ICD-10-CM | POA: Diagnosis not present

## 2020-04-07 DIAGNOSIS — M109 Gout, unspecified: Secondary | ICD-10-CM | POA: Diagnosis not present

## 2020-04-07 DIAGNOSIS — E119 Type 2 diabetes mellitus without complications: Secondary | ICD-10-CM | POA: Diagnosis not present

## 2020-04-07 DIAGNOSIS — I2510 Atherosclerotic heart disease of native coronary artery without angina pectoris: Secondary | ICD-10-CM | POA: Diagnosis not present

## 2020-04-07 DIAGNOSIS — F329 Major depressive disorder, single episode, unspecified: Secondary | ICD-10-CM | POA: Diagnosis not present

## 2020-04-07 DIAGNOSIS — G8929 Other chronic pain: Secondary | ICD-10-CM | POA: Diagnosis not present

## 2020-04-07 DIAGNOSIS — M810 Age-related osteoporosis without current pathological fracture: Secondary | ICD-10-CM | POA: Diagnosis not present

## 2020-04-08 DIAGNOSIS — M109 Gout, unspecified: Secondary | ICD-10-CM | POA: Diagnosis not present

## 2020-04-08 DIAGNOSIS — G8929 Other chronic pain: Secondary | ICD-10-CM | POA: Diagnosis not present

## 2020-04-08 DIAGNOSIS — F329 Major depressive disorder, single episode, unspecified: Secondary | ICD-10-CM | POA: Diagnosis not present

## 2020-04-08 DIAGNOSIS — M159 Polyosteoarthritis, unspecified: Secondary | ICD-10-CM | POA: Diagnosis not present

## 2020-04-08 DIAGNOSIS — I10 Essential (primary) hypertension: Secondary | ICD-10-CM | POA: Diagnosis not present

## 2020-04-08 DIAGNOSIS — I2510 Atherosclerotic heart disease of native coronary artery without angina pectoris: Secondary | ICD-10-CM | POA: Diagnosis not present

## 2020-04-08 DIAGNOSIS — M810 Age-related osteoporosis without current pathological fracture: Secondary | ICD-10-CM | POA: Diagnosis not present

## 2020-04-08 DIAGNOSIS — F419 Anxiety disorder, unspecified: Secondary | ICD-10-CM | POA: Diagnosis not present

## 2020-04-08 DIAGNOSIS — E119 Type 2 diabetes mellitus without complications: Secondary | ICD-10-CM | POA: Diagnosis not present

## 2020-04-11 DIAGNOSIS — I10 Essential (primary) hypertension: Secondary | ICD-10-CM | POA: Diagnosis not present

## 2020-04-11 DIAGNOSIS — M109 Gout, unspecified: Secondary | ICD-10-CM | POA: Diagnosis not present

## 2020-04-11 DIAGNOSIS — E119 Type 2 diabetes mellitus without complications: Secondary | ICD-10-CM | POA: Diagnosis not present

## 2020-04-11 DIAGNOSIS — F329 Major depressive disorder, single episode, unspecified: Secondary | ICD-10-CM | POA: Diagnosis not present

## 2020-04-11 DIAGNOSIS — M159 Polyosteoarthritis, unspecified: Secondary | ICD-10-CM | POA: Diagnosis not present

## 2020-04-11 DIAGNOSIS — G8929 Other chronic pain: Secondary | ICD-10-CM | POA: Diagnosis not present

## 2020-04-11 DIAGNOSIS — I2510 Atherosclerotic heart disease of native coronary artery without angina pectoris: Secondary | ICD-10-CM | POA: Diagnosis not present

## 2020-04-11 DIAGNOSIS — F419 Anxiety disorder, unspecified: Secondary | ICD-10-CM | POA: Diagnosis not present

## 2020-04-11 DIAGNOSIS — M810 Age-related osteoporosis without current pathological fracture: Secondary | ICD-10-CM | POA: Diagnosis not present

## 2020-04-12 DIAGNOSIS — M159 Polyosteoarthritis, unspecified: Secondary | ICD-10-CM | POA: Diagnosis not present

## 2020-04-12 DIAGNOSIS — M109 Gout, unspecified: Secondary | ICD-10-CM | POA: Diagnosis not present

## 2020-04-12 DIAGNOSIS — I10 Essential (primary) hypertension: Secondary | ICD-10-CM | POA: Diagnosis not present

## 2020-04-12 DIAGNOSIS — M810 Age-related osteoporosis without current pathological fracture: Secondary | ICD-10-CM | POA: Diagnosis not present

## 2020-04-12 DIAGNOSIS — F329 Major depressive disorder, single episode, unspecified: Secondary | ICD-10-CM | POA: Diagnosis not present

## 2020-04-12 DIAGNOSIS — F419 Anxiety disorder, unspecified: Secondary | ICD-10-CM | POA: Diagnosis not present

## 2020-04-12 DIAGNOSIS — I2510 Atherosclerotic heart disease of native coronary artery without angina pectoris: Secondary | ICD-10-CM | POA: Diagnosis not present

## 2020-04-12 DIAGNOSIS — E119 Type 2 diabetes mellitus without complications: Secondary | ICD-10-CM | POA: Diagnosis not present

## 2020-04-12 DIAGNOSIS — G8929 Other chronic pain: Secondary | ICD-10-CM | POA: Diagnosis not present

## 2020-04-14 ENCOUNTER — Telehealth: Payer: Self-pay

## 2020-04-14 DIAGNOSIS — I2510 Atherosclerotic heart disease of native coronary artery without angina pectoris: Secondary | ICD-10-CM | POA: Diagnosis not present

## 2020-04-14 DIAGNOSIS — K219 Gastro-esophageal reflux disease without esophagitis: Secondary | ICD-10-CM

## 2020-04-14 DIAGNOSIS — I10 Essential (primary) hypertension: Secondary | ICD-10-CM | POA: Diagnosis not present

## 2020-04-14 DIAGNOSIS — Z7902 Long term (current) use of antithrombotics/antiplatelets: Secondary | ICD-10-CM

## 2020-04-14 DIAGNOSIS — Z86718 Personal history of other venous thrombosis and embolism: Secondary | ICD-10-CM

## 2020-04-14 DIAGNOSIS — Z9181 History of falling: Secondary | ICD-10-CM

## 2020-04-14 DIAGNOSIS — E538 Deficiency of other specified B group vitamins: Secondary | ICD-10-CM

## 2020-04-14 DIAGNOSIS — E669 Obesity, unspecified: Secondary | ICD-10-CM

## 2020-04-14 DIAGNOSIS — R569 Unspecified convulsions: Secondary | ICD-10-CM

## 2020-04-14 DIAGNOSIS — Z6832 Body mass index (BMI) 32.0-32.9, adult: Secondary | ICD-10-CM

## 2020-04-14 DIAGNOSIS — F329 Major depressive disorder, single episode, unspecified: Secondary | ICD-10-CM | POA: Diagnosis not present

## 2020-04-14 DIAGNOSIS — G8929 Other chronic pain: Secondary | ICD-10-CM | POA: Diagnosis not present

## 2020-04-14 DIAGNOSIS — M810 Age-related osteoporosis without current pathological fracture: Secondary | ICD-10-CM | POA: Diagnosis not present

## 2020-04-14 DIAGNOSIS — E119 Type 2 diabetes mellitus without complications: Secondary | ICD-10-CM | POA: Diagnosis not present

## 2020-04-14 DIAGNOSIS — M159 Polyosteoarthritis, unspecified: Secondary | ICD-10-CM | POA: Diagnosis not present

## 2020-04-14 DIAGNOSIS — M109 Gout, unspecified: Secondary | ICD-10-CM | POA: Diagnosis not present

## 2020-04-14 DIAGNOSIS — E559 Vitamin D deficiency, unspecified: Secondary | ICD-10-CM

## 2020-04-14 DIAGNOSIS — Z8616 Personal history of COVID-19: Secondary | ICD-10-CM

## 2020-04-14 DIAGNOSIS — F419 Anxiety disorder, unspecified: Secondary | ICD-10-CM | POA: Diagnosis not present

## 2020-04-14 DIAGNOSIS — Z955 Presence of coronary angioplasty implant and graft: Secondary | ICD-10-CM

## 2020-04-14 DIAGNOSIS — E785 Hyperlipidemia, unspecified: Secondary | ICD-10-CM

## 2020-04-14 DIAGNOSIS — Z8701 Personal history of pneumonia (recurrent): Secondary | ICD-10-CM

## 2020-04-14 NOTE — Telephone Encounter (Signed)
New message    [facility] calling asking for verbal orders  OT - one time a week for 4 weeks

## 2020-04-15 NOTE — Telephone Encounter (Signed)
Left detailed message with verbal orders requested below.

## 2020-04-19 ENCOUNTER — Ambulatory Visit (INDEPENDENT_AMBULATORY_CARE_PROVIDER_SITE_OTHER): Payer: Medicare HMO | Admitting: Internal Medicine

## 2020-04-19 ENCOUNTER — Encounter: Payer: Self-pay | Admitting: Internal Medicine

## 2020-04-19 ENCOUNTER — Other Ambulatory Visit: Payer: Self-pay

## 2020-04-19 VITALS — BP 148/82 | HR 106 | Temp 98.6°F | Ht 66.0 in | Wt 200.0 lb

## 2020-04-19 DIAGNOSIS — E538 Deficiency of other specified B group vitamins: Secondary | ICD-10-CM | POA: Diagnosis not present

## 2020-04-19 DIAGNOSIS — M353 Polymyalgia rheumatica: Secondary | ICD-10-CM | POA: Diagnosis not present

## 2020-04-19 DIAGNOSIS — R5383 Other fatigue: Secondary | ICD-10-CM | POA: Diagnosis not present

## 2020-04-19 DIAGNOSIS — U071 COVID-19: Secondary | ICD-10-CM | POA: Diagnosis not present

## 2020-04-19 DIAGNOSIS — N179 Acute kidney failure, unspecified: Secondary | ICD-10-CM | POA: Diagnosis not present

## 2020-04-19 DIAGNOSIS — I10 Essential (primary) hypertension: Secondary | ICD-10-CM | POA: Diagnosis not present

## 2020-04-19 DIAGNOSIS — M159 Polyosteoarthritis, unspecified: Secondary | ICD-10-CM | POA: Diagnosis not present

## 2020-04-19 DIAGNOSIS — W1800XA Striking against unspecified object with subsequent fall, initial encounter: Secondary | ICD-10-CM

## 2020-04-19 DIAGNOSIS — F419 Anxiety disorder, unspecified: Secondary | ICD-10-CM | POA: Diagnosis not present

## 2020-04-19 DIAGNOSIS — R569 Unspecified convulsions: Secondary | ICD-10-CM | POA: Diagnosis not present

## 2020-04-19 DIAGNOSIS — E1121 Type 2 diabetes mellitus with diabetic nephropathy: Secondary | ICD-10-CM

## 2020-04-19 DIAGNOSIS — I5030 Unspecified diastolic (congestive) heart failure: Secondary | ICD-10-CM | POA: Diagnosis not present

## 2020-04-19 DIAGNOSIS — G8929 Other chronic pain: Secondary | ICD-10-CM | POA: Diagnosis not present

## 2020-04-19 DIAGNOSIS — I2510 Atherosclerotic heart disease of native coronary artery without angina pectoris: Secondary | ICD-10-CM | POA: Diagnosis not present

## 2020-04-19 DIAGNOSIS — F329 Major depressive disorder, single episode, unspecified: Secondary | ICD-10-CM | POA: Diagnosis not present

## 2020-04-19 DIAGNOSIS — M109 Gout, unspecified: Secondary | ICD-10-CM | POA: Diagnosis not present

## 2020-04-19 DIAGNOSIS — E559 Vitamin D deficiency, unspecified: Secondary | ICD-10-CM | POA: Diagnosis not present

## 2020-04-19 DIAGNOSIS — E119 Type 2 diabetes mellitus without complications: Secondary | ICD-10-CM | POA: Diagnosis not present

## 2020-04-19 DIAGNOSIS — M810 Age-related osteoporosis without current pathological fracture: Secondary | ICD-10-CM | POA: Diagnosis not present

## 2020-04-19 LAB — BASIC METABOLIC PANEL
BUN: 20 mg/dL (ref 6–23)
CO2: 24 mEq/L (ref 19–32)
Calcium: 9.5 mg/dL (ref 8.4–10.5)
Chloride: 105 mEq/L (ref 96–112)
Creatinine, Ser: 1.03 mg/dL (ref 0.40–1.20)
GFR: 61.62 mL/min (ref >60.00)
Glucose, Bld: 120 mg/dL — ABNORMAL HIGH (ref 70–99)
Potassium: 4.4 mEq/L (ref 3.5–5.1)
Sodium: 137 mEq/L (ref 135–145)

## 2020-04-19 LAB — HEPATIC FUNCTION PANEL
ALT: 8 U/L (ref 0–35)
AST: 21 U/L (ref 0–37)
Albumin: 3.6 g/dL (ref 3.5–5.2)
Alkaline Phosphatase: 69 U/L (ref 39–117)
Bilirubin, Direct: 0.1 mg/dL (ref 0.0–0.3)
Total Bilirubin: 0.4 mg/dL (ref 0.2–1.2)
Total Protein: 6.9 g/dL (ref 6.0–8.3)

## 2020-04-19 LAB — TSH: TSH: 1.66 u[IU]/mL (ref 0.35–4.50)

## 2020-04-19 LAB — T4, FREE: Free T4: 1.06 ng/dL (ref 0.60–1.60)

## 2020-04-19 LAB — HEMOGLOBIN A1C: Hgb A1c MFr Bld: 7.1 % — ABNORMAL HIGH (ref 4.6–6.5)

## 2020-04-19 LAB — VITAMIN D 25 HYDROXY (VIT D DEFICIENCY, FRACTURES): VITD: 60.29 ng/mL (ref 30.00–100.00)

## 2020-04-19 LAB — VITAMIN B12: Vitamin B-12: >1526 pg/mL — ABNORMAL HIGH (ref 211–911)

## 2020-04-19 MED ORDER — METHYLPREDNISOLONE ACETATE 80 MG/ML IJ SUSP
80.0000 mg | Freq: Once | INTRAMUSCULAR | Status: AC
  Administered 2020-04-19: 80 mg via INTRAMUSCULAR

## 2020-04-19 MED ORDER — LEVETIRACETAM 250 MG PO TABS: 250.0000 mg | ORAL_TABLET | Freq: Two times a day (BID) | ORAL | 1 refills | Status: DC

## 2020-04-19 NOTE — Assessment & Plan Note (Signed)
Depo-medrol 80 mg 

## 2020-04-19 NOTE — Assessment & Plan Note (Signed)
last wt w/o w/c was 169 lbs

## 2020-04-19 NOTE — Assessment & Plan Note (Signed)
No relapse. GFR 38 and she she is weak. Reduce Keppra to 250 mg bid

## 2020-04-19 NOTE — Assessment & Plan Note (Signed)
B12 inj 

## 2020-04-19 NOTE — Assessment & Plan Note (Signed)
  On diet  

## 2020-04-19 NOTE — Addendum Note (Signed)
Addended by: Waldemar Dickens B on: 04/19/2020 03:33 PM   Modules accepted: Orders

## 2020-04-19 NOTE — Progress Notes (Signed)
Subjective:  Patient ID: [name], female    DOB: 09/05/1933  Age: 86 y.o. MRN: [id number]  CC: No chief complaint on file.   HPI [name] presents for a post-hosp f/u - COVID 19. F/u on seizures. C/o weakness, FMS - in PT.  Recovering. Per hx:   "Admit date: 02/05/2020  Discharge date: 02/11/2020  Admitted From: Home  Disposition:  SNF   Recommendations for Outpatient Follow-up:   Follow up with PCP in 1-2 weeks  PCP Please obtain BMP/CBC, 2 view CXR in 1week,  (see Discharge instructions)   PCP Please follow up on the following pending results:    Home Health: None   Equipment/Devices: None  Consultations: None  Discharge Condition: Stable    CODE STATUS: Full   Diet Recommendation: Heart Healthy Low Carb, check CBGs before every meal at bedtime      Chief Complaint  Patient presents with  . Weakness  . Fever     Brief history of present illness from the day of admission and additional interim summary    86 y.o.femalewith medical history significant ofCAD, HTN, HLD, Seizure presented with increasing fatigue, dry cough and increasing SOB for 8 days. Pt had her 2nd COVID shot on 03/16. She has been feeling fine until about 8 days ago, she started to have dry cough and congestion, no chest pain, subjective fever but no chills, and gradually she feels tired and sleepy, poor appetite, and loose diarrhea x 2 yesterday. She started complaining of some weakness and nausea came to the ER after which she was diagnosed with COVID-19 pneumonia and admitted to the hospital.                                                                 Hospital Course   1. Acute Covid 19 Viral Pneumonitis during the ongoing 2020 Covid 19 Pandemic - she had moderate disease and was treated with IV steroids and remdesivir to good effect, now symptom-free and has finished her remdesivir course.  Note she is being placed on oral prednisone taper thereafter continue her  chronic home dose of prednisone which is 5 mg to be taken as needed on a daily basis for the feeling of weakness, patient has been taking it for a while.  SpO2: 98 %  Last Labs            Recent Labs  Lab 02/06/20 0318 02/07/20 0343 02/07/20 0413 02/08/20 0315 02/09/20 0425 02/09/20 0435 02/10/20 0404  CRP 20.1* 14.1*  --  9.9*  --  7.3* 5.5*  DDIMER 2.77* 2.06*  --  2.01*  --  2.08* 1.87*  FERRITIN 373*  --   --   --   --   --   --   BNP  --   --  576.8* 240.2* 212.1*  --  304.9*  PROCALCITON 0.30 0.14  --  0.13  --  <0.10 <0.10      Hepatic Function Latest Ref Rng & Units 02/10/2020 02/09/2020 02/08/2020  Total Protein 6.5 - 8.1 g/dL 6.4(L) 6.6 7.1  Albumin 3.5 - 5.0 g/dL 2.4(L) 2.4(L) 2.4(L)  AST 15 - 41 U/L 24 29 35  ALT 0 - 44 U/L 20 21 22   Alk Phosphatase 38 -  126 U/L 46 48 50  Total Bilirubin 0.3 - 1.2 mg/dL 0.3 0.8 0.6  Bilirubin, Direct 0.0 - 0.3 mg/dL - - -    2. Dehydration with AKI -improved after hydration with IV fluids.  3. History of seizures. Stable on Keppra.  4. Weakness and deconditioning. PT OT and supportive care, requires SNF.  5. Anion gap metabolic acidosis. From diarrhea.   Resolved.  6. UTI. Currently on Rocephin will give 3 doses.  Resolved clinically.  7. Essential hypertension. On Coreg, multiple allergies, started on Norvasc (mild allergy of dizziness, did not have any problems in the hospital with Norvasc) Remains stable."  last wt w/o w/c was 169 lbs.    C/o side effects w/Keppra - weak. No seizure relapse  Outpatient Medications Prior to Visit  Medication Sig Dispense Refill  . Blood Glucose Calibration (ACCU-CHEK AVIVA) SOLN Use as directed (Patient taking differently: 1 each by Other route See admin instructions. Use as directed per physician) 3 each 1  . brimonidine (ALPHAGAN) 0.2 % ophthalmic solution Place 1 drop into both eyes 2 (two) times daily.    . carvedilol (COREG) 25 MG tablet TAKE 1 TABLET BY  MOUTH TWICE DAILY WITH MEALS (Patient taking differently: Take 25 mg by mouth 2 (two) times daily with a meal. ) 14 tablet 0  . cholecalciferol (VITAMIN D) 25 MCG (1000 UNIT) tablet Take 1,000 Units by mouth daily.    . clopidogrel (PLAVIX) 75 MG tablet Take 1 tablet (75 mg total) by mouth daily. 90 tablet 3  . Cyanocobalamin (VITAMIN B-12) 3000 MCG SUBL Place 3,000 mcg under the tongue daily.    . feeding supplement, GLUCERNA SHAKE, (GLUCERNA SHAKE) LIQD Take 237 mLs by mouth 3 (three) times daily between meals.  0  . glucose blood (ONETOUCH VERIO) test strip Use as instructed (Patient taking differently: 1 each by Other route See admin instructions. Use as instructed for blood sugar) 300 each 3  . levETIRAcetam (KEPPRA) 500 MG tablet TAKE 1 TABLET TWICE DAILY (Patient taking differently: Take 500 mg by mouth at bedtime. ) 180 tablet 1  . ondansetron (ZOFRAN) 4 MG tablet TAKE 1 TABLET BY MOUTH TWICE DAILY AS NEEDED FOR NAUSEA FOR VOMITING (Patient taking differently: Take 4 mg by mouth 2 (two) times daily as needed for nausea or vomiting. ) 30 tablet 0  . acetaminophen (TYLENOL) 650 MG CR tablet Take 1,300 mg by mouth every 8 (eight) hours as needed for pain.    . Alcohol Swabs (B-D SINGLE USE SWABS REGULAR) PADS Use to clean area to check blood sugars twice a day 300 each 1  . amLODipine (NORVASC) 10 MG tablet Take 1 tablet (10 mg total) by mouth daily.    [name] aspirin EC 325 MG tablet Take 325 mg by mouth daily.    . bisacodyl (BISACODYL) 5 MG EC tablet Take 5-10 mg by mouth daily as needed for moderate constipation.    [name] guaiFENesin-dextromethorphan (ROBITUSSIN DM) 100-10 MG/5ML syrup Take 10 mLs by mouth every 4 (four) hours as needed for cough. (Patient not taking: Reported on 04/19/2020) 118 mL 0  . nitroGLYCERIN (NITROSTAT) 0.4 MG SL tablet Place 1 tablet (0.4 mg total) under the tongue every 5 (five) minutes x 3 doses as needed for chest pain. (Patient not taking: Reported on 04/19/2020) 25 tablet 3    . ONETOUCH DELICA LANCETS FINE MISC 1 Device by Does not apply route daily as needed. (Patient taking differently: 1 Device by Does not apply route daily  as needed (blood glucose). ) 100 each 3  . pantoprazole (PROTONIX) 40 MG tablet Take 1 tablet (40 mg total) by mouth daily. (Patient not taking: Reported on 04/19/2020) 30 tablet 0  . rosuvastatin (CRESTOR) 20 MG tablet Take 1 tablet (20 mg total) by mouth daily. 90 tablet 3  . predniSONE (DELTASONE) 5 MG tablet Take 4 Pills PO for 3 days, 2 Pills PO for 3 days, 1 Pills PO for 3 days scheduled, after that patient chronically takes 5 mg of prednisone daily as needed as needed for weakness and lack of energy.  Continue as needed 5 mg prednisone on a daily basis as before. (Patient not taking: Reported on 04/19/2020) 65 tablet 0   No facility-administered medications prior to visit.    ROS: Review of Systems  Constitutional: Positive for fatigue and unexpected weight change. Negative for activity change, appetite change and chills.  HENT: Negative for congestion, mouth sores and sinus pressure.   Eyes: Negative for visual disturbance.  Respiratory: Negative for cough and chest tightness.   Gastrointestinal: Negative for abdominal pain and nausea.  Genitourinary: Negative for difficulty urinating, frequency and vaginal pain.  Musculoskeletal: Positive for arthralgias, back pain, gait problem and myalgias.  Skin: Negative for pallor and rash.  Neurological: Positive for dizziness and weakness. Negative for tremors, numbness and headaches.  Psychiatric/Behavioral: Negative for confusion, sleep disturbance and suicidal ideas.    Objective:  BP (!) 148/82 (BP Location: Left Arm)   Pulse (!) 106   Temp 98.6 F (37 C) (Oral)   Ht 5' 6"  (1.676 m)   Wt 200 lb (90.7 kg) Comment: wheelchair  SpO2 98%   BMI 32.28 kg/m   BP Readings from Last 3 Encounters:  04/19/20 (!) 148/82  02/11/20 (!) 150/64  01/29/20 (!) 144/76    Wt Readings from Last 3  Encounters:  04/19/20 200 lb (90.7 kg)  02/07/20 167 lb 12.3 oz (76.1 kg)  01/29/20 178 lb 6.4 oz (80.9 kg)    Physical Exam Constitutional:      General: She is not in acute distress.    Appearance: She is well-developed. She is obese.  HENT:     Head: Normocephalic.     Right Ear: External ear normal.     Left Ear: External ear normal.     Nose: Nose normal.  Eyes:     General:        Right eye: No discharge.        Left eye: No discharge.     Conjunctiva/sclera: Conjunctivae normal.     Pupils: Pupils are equal, round, and reactive to light.  Neck:     Thyroid: No thyromegaly.     Vascular: No JVD.     Trachea: No tracheal deviation.  Cardiovascular:     Rate and Rhythm: Normal rate and regular rhythm.     Heart sounds: Normal heart sounds.  Pulmonary:     Effort: No respiratory distress.     Breath sounds: No stridor. No wheezing.  Abdominal:     General: Bowel sounds are normal. There is no distension.     Palpations: Abdomen is soft. There is no mass.     Tenderness: There is no abdominal tenderness. There is no guarding or rebound.  Musculoskeletal:        General: Tenderness present.     Cervical back: Normal range of motion and neck supple.  Lymphadenopathy:     Cervical: No cervical adenopathy.  Skin:    Findings:  No erythema or rash.  Neurological:     Mental Status: She is oriented to person, place, and time.     Cranial Nerves: No cranial nerve deficit.     Motor: Weakness present. No abnormal muscle tone.     Coordination: Coordination abnormal.     Gait: Gait abnormal.     Deep Tendon Reflexes: Reflexes normal.  Psychiatric:        Behavior: Behavior normal.        Thought Content: Thought content normal.        Judgment: Judgment normal.    In a w/c OV>40 min discussing her recent COVID, seizure meds, fatigue  Lab Results  Component Value Date   WBC 7.4 02/10/2020   HGB 9.0 (L) 02/10/2020   HCT 28.3 (L) 02/10/2020   PLT 316 02/10/2020    GLUCOSE 339 (H) 02/10/2020   CHOL 169 06/25/2015   TRIG 61 06/25/2015   HDL 57 06/25/2015   LDLDIRECT 114.8 01/10/2011   LDLCALC 100 (H) 06/25/2015   ALT 20 02/10/2020   AST 24 02/10/2020   NA 134 (L) 02/10/2020   K 4.4 02/10/2020   CL 100 02/10/2020   CREATININE 1.36 (H) 02/10/2020   BUN 47 (H) 02/10/2020   CO2 23 02/10/2020   TSH 2.89 12/26/2017   INR 1.09 07/17/2016   HGBA1C 6.6 (H) 10/16/2019    DG Chest Portable 1 View  Result Date: 02/05/2020 CLINICAL DATA:  Fever and chills with cough. EXAM: PORTABLE CHEST 1 VIEW COMPARISON:  10/16/2019 FINDINGS: Asymmetric infiltrate in the left more than right lung. Cardiomegaly and vascular pedicle widening. No visible effusion or pneumothorax. IMPRESSION: Asymmetric airspace disease, consistent with pneumonia in this setting. Electronically Signed   By: Monte Fantasia M.D.   On: 02/05/2020 10:22   [place] EKG SITE RITE  Result Date: 02/05/2020 If Site Rite image not attached, placement could not be confirmed due to current cardiac rhythm.   Assessment & Plan:    Follow-up: No follow-ups on file.  Walker Kehr, MD

## 2020-04-19 NOTE — Assessment & Plan Note (Signed)
Labs

## 2020-04-19 NOTE — Addendum Note (Signed)
Addended by: Deatra James on: 04/19/2020 03:31 PM   Modules accepted: Orders

## 2020-04-19 NOTE — Assessment & Plan Note (Signed)
She had her first COVID vaccine prior to her illness The ptlast wt w/o w/c was 169 lbs Post-COVID worsened fatigue/CFS  Depo-medrol inj 80 mg IM Labs

## 2020-04-20 DIAGNOSIS — F419 Anxiety disorder, unspecified: Secondary | ICD-10-CM | POA: Diagnosis not present

## 2020-04-20 DIAGNOSIS — G8929 Other chronic pain: Secondary | ICD-10-CM | POA: Diagnosis not present

## 2020-04-20 DIAGNOSIS — E119 Type 2 diabetes mellitus without complications: Secondary | ICD-10-CM | POA: Diagnosis not present

## 2020-04-20 DIAGNOSIS — I10 Essential (primary) hypertension: Secondary | ICD-10-CM | POA: Diagnosis not present

## 2020-04-20 DIAGNOSIS — M159 Polyosteoarthritis, unspecified: Secondary | ICD-10-CM | POA: Diagnosis not present

## 2020-04-20 DIAGNOSIS — M810 Age-related osteoporosis without current pathological fracture: Secondary | ICD-10-CM | POA: Diagnosis not present

## 2020-04-20 DIAGNOSIS — M109 Gout, unspecified: Secondary | ICD-10-CM | POA: Diagnosis not present

## 2020-04-20 DIAGNOSIS — F329 Major depressive disorder, single episode, unspecified: Secondary | ICD-10-CM | POA: Diagnosis not present

## 2020-04-20 DIAGNOSIS — I2510 Atherosclerotic heart disease of native coronary artery without angina pectoris: Secondary | ICD-10-CM | POA: Diagnosis not present

## 2020-04-21 DIAGNOSIS — F329 Major depressive disorder, single episode, unspecified: Secondary | ICD-10-CM | POA: Diagnosis not present

## 2020-04-21 DIAGNOSIS — I2510 Atherosclerotic heart disease of native coronary artery without angina pectoris: Secondary | ICD-10-CM | POA: Diagnosis not present

## 2020-04-21 DIAGNOSIS — M810 Age-related osteoporosis without current pathological fracture: Secondary | ICD-10-CM | POA: Diagnosis not present

## 2020-04-21 DIAGNOSIS — G8929 Other chronic pain: Secondary | ICD-10-CM | POA: Diagnosis not present

## 2020-04-21 DIAGNOSIS — E119 Type 2 diabetes mellitus without complications: Secondary | ICD-10-CM | POA: Diagnosis not present

## 2020-04-21 DIAGNOSIS — I10 Essential (primary) hypertension: Secondary | ICD-10-CM | POA: Diagnosis not present

## 2020-04-21 DIAGNOSIS — M109 Gout, unspecified: Secondary | ICD-10-CM | POA: Diagnosis not present

## 2020-04-21 DIAGNOSIS — M159 Polyosteoarthritis, unspecified: Secondary | ICD-10-CM | POA: Diagnosis not present

## 2020-04-21 DIAGNOSIS — F419 Anxiety disorder, unspecified: Secondary | ICD-10-CM | POA: Diagnosis not present

## 2020-04-22 DIAGNOSIS — G8929 Other chronic pain: Secondary | ICD-10-CM | POA: Diagnosis not present

## 2020-04-22 DIAGNOSIS — I10 Essential (primary) hypertension: Secondary | ICD-10-CM | POA: Diagnosis not present

## 2020-04-22 DIAGNOSIS — M810 Age-related osteoporosis without current pathological fracture: Secondary | ICD-10-CM | POA: Diagnosis not present

## 2020-04-22 DIAGNOSIS — F329 Major depressive disorder, single episode, unspecified: Secondary | ICD-10-CM | POA: Diagnosis not present

## 2020-04-22 DIAGNOSIS — E119 Type 2 diabetes mellitus without complications: Secondary | ICD-10-CM | POA: Diagnosis not present

## 2020-04-22 DIAGNOSIS — M109 Gout, unspecified: Secondary | ICD-10-CM | POA: Diagnosis not present

## 2020-04-22 DIAGNOSIS — I2510 Atherosclerotic heart disease of native coronary artery without angina pectoris: Secondary | ICD-10-CM | POA: Diagnosis not present

## 2020-04-22 DIAGNOSIS — M159 Polyosteoarthritis, unspecified: Secondary | ICD-10-CM | POA: Diagnosis not present

## 2020-04-22 DIAGNOSIS — F419 Anxiety disorder, unspecified: Secondary | ICD-10-CM | POA: Diagnosis not present

## 2020-04-24 DIAGNOSIS — I2589 Other forms of chronic ischemic heart disease: Secondary | ICD-10-CM | POA: Diagnosis not present

## 2020-04-25 DIAGNOSIS — M810 Age-related osteoporosis without current pathological fracture: Secondary | ICD-10-CM | POA: Diagnosis not present

## 2020-04-25 DIAGNOSIS — M109 Gout, unspecified: Secondary | ICD-10-CM | POA: Diagnosis not present

## 2020-04-25 DIAGNOSIS — F419 Anxiety disorder, unspecified: Secondary | ICD-10-CM | POA: Diagnosis not present

## 2020-04-25 DIAGNOSIS — F329 Major depressive disorder, single episode, unspecified: Secondary | ICD-10-CM | POA: Diagnosis not present

## 2020-04-25 DIAGNOSIS — G8929 Other chronic pain: Secondary | ICD-10-CM | POA: Diagnosis not present

## 2020-04-25 DIAGNOSIS — E119 Type 2 diabetes mellitus without complications: Secondary | ICD-10-CM | POA: Diagnosis not present

## 2020-04-25 DIAGNOSIS — I10 Essential (primary) hypertension: Secondary | ICD-10-CM | POA: Diagnosis not present

## 2020-04-25 DIAGNOSIS — M159 Polyosteoarthritis, unspecified: Secondary | ICD-10-CM | POA: Diagnosis not present

## 2020-04-25 DIAGNOSIS — I2510 Atherosclerotic heart disease of native coronary artery without angina pectoris: Secondary | ICD-10-CM | POA: Diagnosis not present

## 2020-04-29 DIAGNOSIS — I2510 Atherosclerotic heart disease of native coronary artery without angina pectoris: Secondary | ICD-10-CM | POA: Diagnosis not present

## 2020-04-29 DIAGNOSIS — G8929 Other chronic pain: Secondary | ICD-10-CM | POA: Diagnosis not present

## 2020-04-29 DIAGNOSIS — M159 Polyosteoarthritis, unspecified: Secondary | ICD-10-CM | POA: Diagnosis not present

## 2020-04-29 DIAGNOSIS — M810 Age-related osteoporosis without current pathological fracture: Secondary | ICD-10-CM | POA: Diagnosis not present

## 2020-04-29 DIAGNOSIS — F419 Anxiety disorder, unspecified: Secondary | ICD-10-CM | POA: Diagnosis not present

## 2020-04-29 DIAGNOSIS — E119 Type 2 diabetes mellitus without complications: Secondary | ICD-10-CM | POA: Diagnosis not present

## 2020-04-29 DIAGNOSIS — F329 Major depressive disorder, single episode, unspecified: Secondary | ICD-10-CM | POA: Diagnosis not present

## 2020-04-29 DIAGNOSIS — M109 Gout, unspecified: Secondary | ICD-10-CM | POA: Diagnosis not present

## 2020-04-29 DIAGNOSIS — I10 Essential (primary) hypertension: Secondary | ICD-10-CM | POA: Diagnosis not present

## 2020-05-03 DIAGNOSIS — F419 Anxiety disorder, unspecified: Secondary | ICD-10-CM | POA: Diagnosis not present

## 2020-05-03 DIAGNOSIS — M810 Age-related osteoporosis without current pathological fracture: Secondary | ICD-10-CM | POA: Diagnosis not present

## 2020-05-03 DIAGNOSIS — G8929 Other chronic pain: Secondary | ICD-10-CM | POA: Diagnosis not present

## 2020-05-03 DIAGNOSIS — E119 Type 2 diabetes mellitus without complications: Secondary | ICD-10-CM | POA: Diagnosis not present

## 2020-05-03 DIAGNOSIS — M159 Polyosteoarthritis, unspecified: Secondary | ICD-10-CM | POA: Diagnosis not present

## 2020-05-03 DIAGNOSIS — I10 Essential (primary) hypertension: Secondary | ICD-10-CM | POA: Diagnosis not present

## 2020-05-03 DIAGNOSIS — M109 Gout, unspecified: Secondary | ICD-10-CM | POA: Diagnosis not present

## 2020-05-03 DIAGNOSIS — F329 Major depressive disorder, single episode, unspecified: Secondary | ICD-10-CM | POA: Diagnosis not present

## 2020-05-03 DIAGNOSIS — I2510 Atherosclerotic heart disease of native coronary artery without angina pectoris: Secondary | ICD-10-CM | POA: Diagnosis not present

## 2020-05-05 DIAGNOSIS — F329 Major depressive disorder, single episode, unspecified: Secondary | ICD-10-CM | POA: Diagnosis not present

## 2020-05-05 DIAGNOSIS — G8929 Other chronic pain: Secondary | ICD-10-CM | POA: Diagnosis not present

## 2020-05-05 DIAGNOSIS — M159 Polyosteoarthritis, unspecified: Secondary | ICD-10-CM | POA: Diagnosis not present

## 2020-05-05 DIAGNOSIS — M109 Gout, unspecified: Secondary | ICD-10-CM | POA: Diagnosis not present

## 2020-05-05 DIAGNOSIS — E119 Type 2 diabetes mellitus without complications: Secondary | ICD-10-CM | POA: Diagnosis not present

## 2020-05-05 DIAGNOSIS — I10 Essential (primary) hypertension: Secondary | ICD-10-CM | POA: Diagnosis not present

## 2020-05-05 DIAGNOSIS — I2510 Atherosclerotic heart disease of native coronary artery without angina pectoris: Secondary | ICD-10-CM | POA: Diagnosis not present

## 2020-05-05 DIAGNOSIS — M810 Age-related osteoporosis without current pathological fracture: Secondary | ICD-10-CM | POA: Diagnosis not present

## 2020-05-05 DIAGNOSIS — F419 Anxiety disorder, unspecified: Secondary | ICD-10-CM | POA: Diagnosis not present

## 2020-05-10 DIAGNOSIS — M109 Gout, unspecified: Secondary | ICD-10-CM | POA: Diagnosis not present

## 2020-05-10 DIAGNOSIS — I10 Essential (primary) hypertension: Secondary | ICD-10-CM | POA: Diagnosis not present

## 2020-05-10 DIAGNOSIS — E119 Type 2 diabetes mellitus without complications: Secondary | ICD-10-CM | POA: Diagnosis not present

## 2020-05-10 DIAGNOSIS — I2510 Atherosclerotic heart disease of native coronary artery without angina pectoris: Secondary | ICD-10-CM | POA: Diagnosis not present

## 2020-05-10 DIAGNOSIS — G8929 Other chronic pain: Secondary | ICD-10-CM | POA: Diagnosis not present

## 2020-05-10 DIAGNOSIS — F329 Major depressive disorder, single episode, unspecified: Secondary | ICD-10-CM | POA: Diagnosis not present

## 2020-05-10 DIAGNOSIS — M159 Polyosteoarthritis, unspecified: Secondary | ICD-10-CM | POA: Diagnosis not present

## 2020-05-10 DIAGNOSIS — F419 Anxiety disorder, unspecified: Secondary | ICD-10-CM | POA: Diagnosis not present

## 2020-05-10 DIAGNOSIS — M810 Age-related osteoporosis without current pathological fracture: Secondary | ICD-10-CM | POA: Diagnosis not present

## 2020-05-24 DIAGNOSIS — I2589 Other forms of chronic ischemic heart disease: Secondary | ICD-10-CM | POA: Diagnosis not present

## 2020-06-06 ENCOUNTER — Telehealth: Payer: Self-pay

## 2020-06-06 NOTE — Telephone Encounter (Signed)
New message   The patient calling    1. Asking for a mild laxative  2. Clarify is she on bayer asa   3. [facility] - [place], [place] - [address]

## 2020-06-07 ENCOUNTER — Other Ambulatory Visit: Payer: Self-pay | Admitting: Internal Medicine

## 2020-06-07 NOTE — Telephone Encounter (Signed)
1.  Senokot S1 daily or twice daily for constipation 2.  She is not on aspirin  Thanks

## 2020-06-08 ENCOUNTER — Other Ambulatory Visit: Payer: Self-pay | Admitting: Internal Medicine

## 2020-06-09 NOTE — Telephone Encounter (Signed)
Called pt, no answer and unable to leave vm

## 2020-06-10 NOTE — Telephone Encounter (Signed)
Pt informed of below.  

## 2020-07-20 ENCOUNTER — Ambulatory Visit: Payer: Medicare HMO | Admitting: Internal Medicine

## 2020-07-20 DIAGNOSIS — Z0289 Encounter for other administrative examinations: Secondary | ICD-10-CM

## 2020-08-02 ENCOUNTER — Telehealth: Payer: Self-pay | Admitting: Internal Medicine

## 2020-08-02 ENCOUNTER — Emergency Department (HOSPITAL_COMMUNITY): Payer: Medicare HMO

## 2020-08-02 ENCOUNTER — Telehealth: Payer: Self-pay | Admitting: Cardiology

## 2020-08-02 ENCOUNTER — Emergency Department (HOSPITAL_COMMUNITY)
Admission: EM | Admit: 2020-08-02 | Discharge: 2020-08-03 | Disposition: A | Discharge status: Home / Self Care | Payer: Medicare HMO | Attending: Emergency Medicine | Admitting: Emergency Medicine

## 2020-08-02 ENCOUNTER — Encounter (HOSPITAL_COMMUNITY): Payer: Self-pay | Admitting: *Deleted

## 2020-08-02 ENCOUNTER — Other Ambulatory Visit: Payer: Self-pay

## 2020-08-02 ENCOUNTER — Telehealth: Payer: Self-pay

## 2020-08-02 DIAGNOSIS — R079 Chest pain, unspecified: Secondary | ICD-10-CM | POA: Diagnosis not present

## 2020-08-02 DIAGNOSIS — I700 Atherosclerosis of aorta: Secondary | ICD-10-CM | POA: Diagnosis not present

## 2020-08-02 DIAGNOSIS — R42 Dizziness and giddiness: Secondary | ICD-10-CM | POA: Diagnosis not present

## 2020-08-02 DIAGNOSIS — R0789 Other chest pain: Secondary | ICD-10-CM | POA: Insufficient documentation

## 2020-08-02 DIAGNOSIS — M25471 Effusion, right ankle: Secondary | ICD-10-CM | POA: Insufficient documentation

## 2020-08-02 DIAGNOSIS — M25472 Effusion, left ankle: Secondary | ICD-10-CM | POA: Insufficient documentation

## 2020-08-02 DIAGNOSIS — Z5321 Procedure and treatment not carried out due to patient leaving prior to being seen by health care provider: Secondary | ICD-10-CM | POA: Insufficient documentation

## 2020-08-02 DIAGNOSIS — R0602 Shortness of breath: Secondary | ICD-10-CM | POA: Insufficient documentation

## 2020-08-02 LAB — CBC
HCT: 30.8 % — ABNORMAL LOW (ref 36.0–46.0)
Hemoglobin: 9.4 g/dL — ABNORMAL LOW (ref 12.0–15.0)
MCH: 29.1 pg (ref 26.0–34.0)
MCHC: 30.5 g/dL (ref 30.0–36.0)
MCV: 95.4 fL (ref 80.0–100.0)
Platelets: 147 10*3/uL — ABNORMAL LOW (ref 150–400)
RBC: 3.23 MIL/uL — ABNORMAL LOW (ref 3.87–5.11)
RDW: 12.1 % (ref 11.5–15.5)
WBC: 4.7 10*3/uL (ref 4.0–10.5)
nRBC: 0 % (ref 0.0–0.2)

## 2020-08-02 LAB — BASIC METABOLIC PANEL
Anion gap: 8 (ref 5–15)
BUN: 19 mg/dL (ref 8–23)
CO2: 22 mmol/L (ref 22–32)
Calcium: 9.3 mg/dL (ref 8.9–10.3)
Chloride: 109 mmol/L (ref 98–111)
Creatinine, Ser: 1.06 mg/dL — ABNORMAL HIGH (ref 0.44–1.00)
GFR calc Af Amer: 55 mL/min — ABNORMAL LOW (ref >60)
GFR calc non Af Amer: 48 mL/min — ABNORMAL LOW (ref >60)
Glucose, Bld: 93 mg/dL (ref 70–99)
Potassium: 4.5 mmol/L (ref 3.5–5.1)
Sodium: 139 mmol/L (ref 135–145)

## 2020-08-02 NOTE — Telephone Encounter (Signed)
Patient c/o Palpitations:  High priority if patient c/o lightheadedness, shortness of breath, or chest pain  1) How long have you had palpitations/irregular HR/ Afib? Are you having the symptoms now? No, 2 weeks  2) Are you currently experiencing lightheadedness, SOB or CP? CP and SOB, not having symptoms now, comes and goes  3) Do you have a history of afib (atrial fibrillation) or irregular heart rhythm? no  4) Have you checked your BP or HR? (document readings if available): no  5) Are you experiencing any other symptoms? Eyes are blurry    Patient states for about 2 weeks she has been having a hard and fast heart beat. She states she also gets CP and SOB, but is not having these symptoms now. She states she is also having blurry vision now, but does not have a way to check her BP.

## 2020-08-02 NOTE — Telephone Encounter (Signed)
Agree 

## 2020-08-02 NOTE — ED Notes (Signed)
[phone] linda harrston daug. Call she has med. list

## 2020-08-02 NOTE — Telephone Encounter (Signed)
Pt states intermittently her heart beats fast & hard accompanied by SOB when she attempts to get up for 2-3 weeks.  She states she had a "slight heart attack" in Dec. She states when she is at rest she "feels fine".  Pt instructed to follow the advice of the [facility] RN.  She says she does not have a ride right now, but will let her daughter know.

## 2020-08-02 NOTE — Telephone Encounter (Signed)
Spoke with patient and asked if she could allow EMS to evaluate her but she will NOT get in an ambulance. She also states her daughter just called her and she asked if would return a call to Axel Filler with daughter [name], explained patient's symptoms and advised she take her to [facility] ED for evaluation today. She plans to take her mom as advised

## 2020-08-02 NOTE — Telephone Encounter (Signed)
Patient called and said that she is having trouble breathing, SOB, and breathing hard. Patient transferred to [facility]. Transferred to [facility].

## 2020-08-02 NOTE — Telephone Encounter (Signed)
Patient reports her heart is beating hard, feels nervous, chest pain, dizziness - this has been going on for 2-3 weeks. Patient reports shortness of breath for 2-3 weeks. She reports new-onset blurry vision today. She states the left side of her body is weaker which is new. She reports swelling in feet.   She does not have a way to check BP at home.   Patient has h/o CAD, TIA/stroke Advised she go to [facility] ED for evaluation of her symptoms She states she will not go in ambulance by EMS and will try to have a daughter take her but it would be tomorrow.  Attempted to contact daughter [name] with patient's permission to discuss recommendations but was unable to reach  Message sent to MD

## 2020-08-02 NOTE — ED Triage Notes (Addendum)
Pt reports feeling bad for several days. Pt reports having a chest discomfort that she describes as "feeling bloated". Has mild sob. Reports recent dizziness and swelling to ankles. No distress noted at triage.

## 2020-08-03 NOTE — ED Notes (Signed)
Pt stated they were leaving  

## 2020-08-26 ENCOUNTER — Ambulatory Visit: Payer: Medicare HMO | Admitting: Internal Medicine

## 2020-08-29 ENCOUNTER — Ambulatory Visit (INDEPENDENT_AMBULATORY_CARE_PROVIDER_SITE_OTHER): Payer: Medicare HMO | Admitting: Internal Medicine

## 2020-08-29 ENCOUNTER — Other Ambulatory Visit: Payer: Self-pay

## 2020-08-29 ENCOUNTER — Encounter: Payer: Self-pay | Admitting: Internal Medicine

## 2020-08-29 VITALS — BP 172/80 | HR 64 | Temp 98.5°F

## 2020-08-29 DIAGNOSIS — M2550 Pain in unspecified joint: Secondary | ICD-10-CM

## 2020-08-29 DIAGNOSIS — F32A Depression, unspecified: Secondary | ICD-10-CM

## 2020-08-29 DIAGNOSIS — R531 Weakness: Secondary | ICD-10-CM

## 2020-08-29 DIAGNOSIS — Z23 Encounter for immunization: Secondary | ICD-10-CM

## 2020-08-29 DIAGNOSIS — I10 Essential (primary) hypertension: Secondary | ICD-10-CM | POA: Diagnosis not present

## 2020-08-29 DIAGNOSIS — N183 Chronic kidney disease, stage 3 unspecified: Secondary | ICD-10-CM

## 2020-08-29 HISTORY — DX: Chronic kidney disease, stage 3 unspecified: N18.30

## 2020-08-29 MED ORDER — CITALOPRAM HYDROBROMIDE 10 MG PO TABS: 10.0000 mg | ORAL_TABLET | Freq: Every day | ORAL | 3 refills | Status: DC

## 2020-08-29 NOTE — Assessment & Plan Note (Signed)
stable overall by history and exam, recent data reviewed with pt, and pt to continue medical treatment as before,  to f/u any worsening symptoms or concerns  

## 2020-08-29 NOTE — Patient Instructions (Addendum)
You had the flu shot today  Please take all new medication as prescribed - the celexa 10 mg per day  You can also take tylenol ES 650 mg at 2 tabs by mouth twice per day for pain  Please continue all other medications as before, and refills have been done if requested.  Please have the pharmacy call with any other refills you may need.  Please keep your appointments with your specialists as you may have planned  You will be contacted regarding the referral for: orthopedic (murphy wainer) and physical therapy

## 2020-08-29 NOTE — Assessment & Plan Note (Signed)
For celexa 10 qd 

## 2020-08-29 NOTE — Assessment & Plan Note (Signed)
Also for PT referral 

## 2020-08-29 NOTE — Progress Notes (Signed)
Subjective:    Patient ID: [name], female    DOB: Mar 06, 1933, 86 y.o.   MRN: [id number]  HPI  Pt of Dr Posey Rea with chronic bilateral knee pain, gait disorder walking with walker and last PT in may 2021 now presents after sedentary existence for several months, c/o persistent pain, more difficulty ambulating, allergy to tramadol so not using, not sure how much tylenol to take, has not seen [facility] for over 6 mo (has had cortisone but no gel shots in past).  Has been taking b12 but no help.  C/o worsening fatigue and debility, and mild worsening depressive symptoms, but no suicidal ideation, or panic.  Pt denies chest pain, increased sob or doe, wheezing, orthopnea, PND, increased LE swelling, palpitations, dizziness or syncope.   Pt denies polydipsia, polyuria  Does c/o ongoing fatigue, but denies signficant daytime hypersomnolence.  Pt denies fever, wt loss, night sweats, Past Medical History:  Diagnosis Date  . Anemia    iron deficiency  . Aneurysm, thoracic aortic (HCC)   . Anxiety   . B12 deficiency   . CAD (coronary artery disease)    s/p stenting of LAD 1999- cath 5-08 EF normal LAD 30-40% restenosis. D1 50% D2 80% LCX & RCA minimal plaque  . Chronic back pain   . CKD (chronic kidney disease) stage 3, GFR 30-59 ml/min (HCC) 08/29/2020  . Constipation   . Depression   . Diabetes mellitus   . DVT (deep venous thrombosis) (HCC)   . GERD (gastroesophageal reflux disease)   . Gout   . HTN (hypertension)   . Hyperlipemia   . Obesity   . Osteoporosis   . Pancreatitis   . Polyarthritis    DJD/ possible PMR  . Renal insufficiency    Cr 1.2-1.3  . Seizures (HCC)   . Tinnitus   . Urinary frequency   . Vertigo   . Vitamin D deficiency    Past Surgical History:  Procedure Laterality Date  . ABDOMINAL HYSTERECTOMY    . CARDIAC CATHETERIZATION  2008   L main 20%, LAD stent patent, D1 50%, D2 80% (small), RCA 20%, EF 55-60%  . CHOLECYSTECTOMY    . CORONARY  ANGIOPLASTY WITH STENT PLACEMENT  1999   LAD stent  . CORONARY STENT INTERVENTION N/A 10/16/2019   Procedure: CORONARY STENT INTERVENTION;  Surgeon: Marykay Lex, MD;  Location: [facility] INVASIVE CV LAB;  Service: Cardiovascular;  Laterality: N/A;  . HEMORRHOID SURGERY    . LAPAROSCOPIC LYSIS OF ADHESIONS N/A 03/24/2015   Procedure: LAPAROSCOPIC LYSIS OF ADHESIONS;  Surgeon: Luretha Murphy, MD;  Location: [facility];  Service: General;  Laterality: N/A;  . LAPAROSCOPY N/A 03/24/2015   Procedure: LAPAROSCOPY DIAGNOSTIC;  Surgeon: Luretha Murphy, MD;  Location: [facility];  Service: General;  Laterality: N/A;  . LAPAROTOMY N/A 03/24/2015   Procedure: LAPAROTOMY with decompression of bowel;  Surgeon: Luretha Murphy, MD;  Location: [facility];  Service: General;  Laterality: N/A;  . LEFT HEART CATH AND CORONARY ANGIOGRAPHY N/A 10/16/2019   Procedure: LEFT HEART CATH AND CORONARY ANGIOGRAPHY;  Surgeon: Marykay Lex, MD;  Location: [facility] INVASIVE CV LAB;  Service: Cardiovascular;  Laterality: N/A;  . TUBAL LIGATION      reports that she has never smoked. She has never used smokeless tobacco. She reports that she does not drink alcohol and does not use drugs. family history includes Diabetes in her mother; Hypertension in her father. She was adopted. Allergies  Allergen Reactions  .  Ativan [Lorazepam] Other (See Comments)    Very confused  . Tramadol Hcl Anxiety and Rash    Headache  . Amlodipine Besylate Other (See Comments)     dizzy  . Atenolol Other (See Comments)    Fatigue  . Benazepril Cough  . Benicar [Olmesartan Medoxomil] Other (See Comments)    HEADACHE  . Cozaar Nausea And Vomiting  . Hydralazine Other (See Comments)    Hair loss  . Hydrochlorothiazide W-Triamterene Other (See Comments)     dizzy  . Hydrocodone Other (See Comments)    HEADACHE  . Hydroxyzine Pamoate Other (See Comments)    Per MAR  . Iodine Other (See Comments)    Per MAR  . Lisinopril Other (See Comments) and Cough     Tired & fatigue  . Peach Flavor Itching  . Penicillins Itching    Has patient had a PCN reaction causing immediate rash, facial/tongue/throat swelling, SOB or lightheadedness with hypotension: No Has patient had a PCN reaction causing severe rash involving mucus membranes or skin necrosis: No Has patient had a PCN reaction that required hospitalization No Has patient had a PCN reaction occurring within the last 10 years: Yes If all of the above answers are "NO", then may proceed with Cephalosporin use.  tolerates cephalosporins OK   . Pravastatin Other (See Comments)    Myalgias-muscle pain  . Prednisolone Nausea Only and Other (See Comments)    Upset stomach  . Strawberry Flavor Itching  . Tramadol Hcl Other (See Comments)    headache  . Benadryl [Diphenhydramine] Itching and Palpitations   Current Outpatient Medications on File Prior to Visit  Medication Sig Dispense Refill  . acetaminophen (TYLENOL) 650 MG CR tablet Take 1,300 mg by mouth every 8 (eight) hours as needed for pain.    . Alcohol Swabs (B-D SINGLE USE SWABS REGULAR) PADS Use to clean area to check blood sugars twice a day 300 each 1  . amLODipine (NORVASC) 10 MG tablet Take 1 tablet (10 mg total) by mouth daily.    [name] aspirin EC 325 MG tablet Take 325 mg by mouth daily.    . bisacodyl (BISACODYL) 5 MG EC tablet Take 5-10 mg by mouth daily as needed for moderate constipation.    . Blood Glucose Calibration (ACCU-CHEK AVIVA) SOLN Use as directed (Patient taking differently: 1 each by Other route See admin instructions. Use as directed per physician) 3 each 1  . brimonidine (ALPHAGAN) 0.2 % ophthalmic solution Place 1 drop into both eyes 2 (two) times daily.    . carvedilol (COREG) 25 MG tablet TAKE 1 TABLET TWICE DAILY WITH MEALS 180 tablet 3  . cholecalciferol (VITAMIN D) 25 MCG (1000 UNIT) tablet Take 1,000 Units by mouth daily.    . clopidogrel (PLAVIX) 75 MG tablet Take 1 tablet (75 mg total) by mouth daily. 90 tablet  3  . Cyanocobalamin (VITAMIN B-12) 3000 MCG SUBL Place 3,000 mcg under the tongue daily.    . feeding supplement, GLUCERNA SHAKE, (GLUCERNA SHAKE) LIQD Take 237 mLs by mouth 3 (three) times daily between meals.  0  . glucose blood (ONETOUCH VERIO) test strip Use as instructed (Patient taking differently: 1 each by Other route See admin instructions. Use as instructed for blood sugar) 300 each 3  . guaiFENesin-dextromethorphan (ROBITUSSIN DM) 100-10 MG/5ML syrup Take 10 mLs by mouth every 4 (four) hours as needed for cough. 118 mL 0  . levETIRAcetam (KEPPRA) 250 MG tablet Take 1 tablet (250 mg total) by  mouth 2 (two) times daily. 180 tablet 1  . nitroGLYCERIN (NITROSTAT) 0.4 MG SL tablet Place 1 tablet (0.4 mg total) under the tongue every 5 (five) minutes x 3 doses as needed for chest pain. 25 tablet 3  . ondansetron (ZOFRAN) 4 MG tablet TAKE 1 TABLET BY MOUTH TWICE DAILY AS NEEDED FOR NAUSEA FOR VOMITING (Patient taking differently: Take 4 mg by mouth 2 (two) times daily as needed for nausea or vomiting. ) 30 tablet 0  . ONETOUCH DELICA LANCETS FINE MISC 1 Device by Does not apply route daily as needed. (Patient taking differently: 1 Device by Does not apply route daily as needed (blood glucose). ) 100 each 3  . pantoprazole (PROTONIX) 40 MG tablet Take 1 tablet (40 mg total) by mouth daily. 30 tablet 0  . levETIRAcetam (KEPPRA) 500 MG tablet Take 500 mg by mouth 2 (two) times daily. (Patient not taking: Reported on 08/29/2020)    . rosuvastatin (CRESTOR) 20 MG tablet Take 1 tablet (20 mg total) by mouth daily. 90 tablet 3   No current facility-administered medications on file prior to visit.   Review of Systems All otherwise neg per pt    Objective:   Physical Exam BP (!) 172/80 (BP Location: Left Arm, Patient Position: Sitting, Cuff Size: Normal)   Pulse 64   Temp 98.5 F (36.9 C) (Oral)   SpO2 99%  VS noted, obese Constitutional: Pt appears in NAD HENT: Head: NCAT.  Right Ear:  External ear normal.  Left Ear: External ear normal.  Eyes: . Pupils are equal, round, and reactive to light. Conjunctivae and EOM are normal Nose: without d/c or deformity Neck: Neck supple. Gross normal ROM Cardiovascular: Normal rate and regular rhythm.   Pulmonary/Chest: Effort normal and breath sounds without rales or wheezing.  Bilateral knees with bony degenerative changes Neurological: Pt is alert. At baseline orientation, motor grossly intact Skin: Skin is warm. No rashes, other new lesions, no LE edema Psychiatric: Pt behavior is normal without agitation , + depressed affect All otherwise neg per pt Lab Results  Component Value Date   WBC 4.7 08/02/2020   HGB 9.4 (L) 08/02/2020   HCT 30.8 (L) 08/02/2020   PLT 147 (L) 08/02/2020   GLUCOSE 93 08/02/2020   CHOL 169 06/25/2015   TRIG 61 06/25/2015   HDL 57 06/25/2015   LDLDIRECT 114.8 01/10/2011   LDLCALC 100 (H) 06/25/2015   ALT 8 04/19/2020   AST 21 04/19/2020   NA 139 08/02/2020   K 4.5 08/02/2020   CL 109 08/02/2020   CREATININE 1.06 (H) 08/02/2020   BUN 19 08/02/2020   CO2 22 08/02/2020   TSH 1.66 04/19/2020   INR 1.09 07/17/2016   HGBA1C 7.1 (H) 04/19/2020      Assessment & Plan:

## 2020-08-29 NOTE — Assessment & Plan Note (Addendum)
Bilateral knee pain, for tylenol 650 -2 po bid prn, refer ortho for gel shots   I spent 41 minutes in preparing to see the patient by review of recent labs, imaging and procedures, obtaining and reviewing separately obtained history, communicating with the patient and family or caregiver, ordering medications, tests or procedures, and documenting clinical information in the EHR including the differential Dx, treatment, and any further evaluation and other management of joint pain, depression, htn, generalized weakness

## 2020-09-01 ENCOUNTER — Telehealth: Payer: Self-pay | Admitting: Physical Therapy

## 2020-09-01 NOTE — Telephone Encounter (Signed)
Attempted to call regarding therapy referral received per facility screening policy due to high risk for complications due to Covid to see if wanting to attend therapy in person. No answer and unable to leave voicemail (home number).

## 2020-09-02 ENCOUNTER — Telehealth: Payer: Self-pay | Admitting: Physical Therapy

## 2020-09-02 NOTE — Telephone Encounter (Signed)
Called regarding therapy referral received per facility screening policy to see if patient wishes to attend therapy in person due to high risk of Covid complications. Spoke with patient's daughter and patient may be interested in pursuing home health therapy rather than outpatient. Informed would need PT order for home health therapy instead from MD if wishing to pursue this versus provided number to contact clinic if wishing to consider therapy at this facility.

## 2020-09-06 ENCOUNTER — Other Ambulatory Visit: Payer: Self-pay | Admitting: Internal Medicine

## 2020-09-07 NOTE — Progress Notes (Deleted)
Cardiology Office Note   Date:  09/07/2020   ID:  ABEEHA TWIST, DOB Jan 31, 1933, MRN [id number]  PCP:  Tresa Garter, MD  Cardiologist:   Rollene Rotunda, MD   No chief complaint on file.     History of Present Illness: [name] is a 86 y.o. female of CAD with cardiac catheterization revealing two-vessel CAD with widely patent previously placed stent to the LAD with culprit lesion felt to be an ulcerated thrombotic irregular proximal to mid RCA lesion.  She underwent a successful but complex PCI/DES of the mid RCA x1.   She called in Sept and she had chest pain and palps.  She went to the ED but did not stay.  I did review these records because she had some lab work.  She had normal electrolytes and CXR showed a questionable early infiltrate.  She was having palpitations when I saw her this spring and she was going to wear a monitor but she was admitted shortly after that visit with COVID pneumonia  ***    ***  She was placed on dual antiplatelet therapy with aspirin and Plavix for minimum of 1 year.  She had a follow-up echocardiogram which revealed an EF of 60% to 65% with normal LV function and grade 1 diastolic dysfunction with mildly dilated atria.  She was also placed on high-dose statin, atorvastatin 40 mg at at bedtime, continued on her home dose of carvedilol 25 mg twice daily.  She was noted to have some transient confusion during her hospitalization, but had returned to normal at discharge.  She is complaining of palpitations.  These seem to happen at night.  She had to take 1 sublingual nitroglycerin when her heart was racing.  She may have couple of these in the day but predominantly lying in a certain position at night.  She is not describing any resting symptoms such as chest pressure, neck or arm discomfort.  There is some positional chest pain.  She is not having symptoms similar to what she had at the time of her acute presentation.  She gets around slowly  with a walker.  She has a lift to get her upstairs.   Past Medical History:  Diagnosis Date  . Anemia    iron deficiency  . Aneurysm, thoracic aortic (HCC)   . Anxiety   . B12 deficiency   . CAD (coronary artery disease)    s/p stenting of LAD 1999- cath 5-08 EF normal LAD 30-40% restenosis. D1 50% D2 80% LCX & RCA minimal plaque  . Chronic back pain   . CKD (chronic kidney disease) stage 3, GFR 30-59 ml/min (HCC) 08/29/2020  . Constipation   . Depression   . Diabetes mellitus   . DVT (deep venous thrombosis) (HCC)   . GERD (gastroesophageal reflux disease)   . Gout   . HTN (hypertension)   . Hyperlipemia   . Obesity   . Osteoporosis   . Pancreatitis   . Polyarthritis    DJD/ possible PMR  . Renal insufficiency    Cr 1.2-1.3  . Seizures (HCC)   . Tinnitus   . Urinary frequency   . Vertigo   . Vitamin D deficiency     Past Surgical History:  Procedure Laterality Date  . ABDOMINAL HYSTERECTOMY    . CARDIAC CATHETERIZATION  2008   L main 20%, LAD stent patent, D1 50%, D2 80% (small), RCA 20%, EF 55-60%  . CHOLECYSTECTOMY    .  CORONARY ANGIOPLASTY WITH STENT PLACEMENT  1999   LAD stent  . CORONARY STENT INTERVENTION N/A 10/16/2019   Procedure: CORONARY STENT INTERVENTION;  Surgeon: Marykay Lex, MD;  Location: [facility] INVASIVE CV LAB;  Service: Cardiovascular;  Laterality: N/A;  . HEMORRHOID SURGERY    . LAPAROSCOPIC LYSIS OF ADHESIONS N/A 03/24/2015   Procedure: LAPAROSCOPIC LYSIS OF ADHESIONS;  Surgeon: Luretha Murphy, MD;  Location: [facility];  Service: General;  Laterality: N/A;  . LAPAROSCOPY N/A 03/24/2015   Procedure: LAPAROSCOPY DIAGNOSTIC;  Surgeon: Luretha Murphy, MD;  Location: [facility];  Service: General;  Laterality: N/A;  . LAPAROTOMY N/A 03/24/2015   Procedure: LAPAROTOMY with decompression of bowel;  Surgeon: Luretha Murphy, MD;  Location: [facility];  Service: General;  Laterality: N/A;  . LEFT HEART CATH AND CORONARY ANGIOGRAPHY N/A 10/16/2019   Procedure: LEFT  HEART CATH AND CORONARY ANGIOGRAPHY;  Surgeon: Marykay Lex, MD;  Location: [facility] INVASIVE CV LAB;  Service: Cardiovascular;  Laterality: N/A;  . TUBAL LIGATION       Current Outpatient Medications  Medication Sig Dispense Refill  . acetaminophen (TYLENOL) 650 MG CR tablet Take 1,300 mg by mouth every 8 (eight) hours as needed for pain.    . Alcohol Swabs (B-D SINGLE USE SWABS REGULAR) PADS Use to clean area to check blood sugars twice a day 300 each 1  . amLODipine (NORVASC) 10 MG tablet Take 1 tablet (10 mg total) by mouth daily.    [name] aspirin EC 325 MG tablet Take 325 mg by mouth daily.    . bisacodyl (BISACODYL) 5 MG EC tablet Take 5-10 mg by mouth daily as needed for moderate constipation.    . Blood Glucose Calibration (ACCU-CHEK AVIVA) SOLN Use as directed (Patient taking differently: 1 each by Other route See admin instructions. Use as directed per physician) 3 each 1  . brimonidine (ALPHAGAN) 0.2 % ophthalmic solution Place 1 drop into both eyes 2 (two) times daily.    . carvedilol (COREG) 25 MG tablet TAKE 1 TABLET TWICE DAILY WITH MEALS 180 tablet 3  . cholecalciferol (VITAMIN D) 25 MCG (1000 UNIT) tablet Take 1,000 Units by mouth daily.    . citalopram (CELEXA) 10 MG tablet Take 1 tablet (10 mg total) by mouth daily. 90 tablet 3  . clopidogrel (PLAVIX) 75 MG tablet Take 1 tablet (75 mg total) by mouth daily. 90 tablet 3  . Cyanocobalamin (VITAMIN B-12) 3000 MCG SUBL Place 3,000 mcg under the tongue daily.    . feeding supplement, GLUCERNA SHAKE, (GLUCERNA SHAKE) LIQD Take 237 mLs by mouth 3 (three) times daily between meals.  0  . glucose blood (ONETOUCH VERIO) test strip Use as instructed (Patient taking differently: 1 each by Other route See admin instructions. Use as instructed for blood sugar) 300 each 3  . guaiFENesin-dextromethorphan (ROBITUSSIN DM) 100-10 MG/5ML syrup Take 10 mLs by mouth every 4 (four) hours as needed for cough. 118 mL 0  . levETIRAcetam (KEPPRA) 250 MG  tablet Take 1 tablet (250 mg total) by mouth 2 (two) times daily. 180 tablet 1  . levETIRAcetam (KEPPRA) 500 MG tablet Take 500 mg by mouth 2 (two) times daily. (Patient not taking: Reported on 08/29/2020)    . meclizine (ANTIVERT) 25 MG tablet TAKE 1/2 TO 1 (ONE-HALF TO ONE) TABLET BY MOUTH THREE TIMES DAILY 45 tablet 0  . nitroGLYCERIN (NITROSTAT) 0.4 MG SL tablet Place 1 tablet (0.4 mg total) under the tongue every 5 (five) minutes x 3 doses as  needed for chest pain. 25 tablet 3  . ondansetron (ZOFRAN) 4 MG tablet TAKE 1 TABLET BY MOUTH TWICE DAILY AS NEEDED FOR NAUSEA OR VOMITING 30 tablet 0  . ONETOUCH DELICA LANCETS FINE MISC 1 Device by Does not apply route daily as needed. (Patient taking differently: 1 Device by Does not apply route daily as needed (blood glucose). ) 100 each 3  . pantoprazole (PROTONIX) 40 MG tablet Take 1 tablet (40 mg total) by mouth daily. 30 tablet 0  . rosuvastatin (CRESTOR) 20 MG tablet Take 1 tablet (20 mg total) by mouth daily. 90 tablet 3   No current facility-administered medications for this visit.    Allergies:   Ativan [lorazepam], Tramadol hcl, Amlodipine besylate, Atenolol, Benazepril, Benicar [olmesartan medoxomil], Cozaar, Hydralazine, Hydrochlorothiazide w-triamterene, Hydrocodone, Hydroxyzine pamoate, Iodine, Lisinopril, Peach flavor, Penicillins, Pravastatin, Prednisolone, Strawberry flavor, Tramadol hcl, and Benadryl [diphenhydramine]    ROS:  Please see the history of present illness.   Otherwise, review of systems are positive for ***.   All other systems are reviewed and negative.    PHYSICAL EXAM: VS:  There were no vitals taken for this visit. , BMI There is no height or weight on file to calculate BMI. GENERAL:  Well appearing NECK:  No jugular venous distention, waveform within normal limits, carotid upstroke brisk and symmetric, no bruits, no thyromegaly LUNGS:  Clear to auscultation bilaterally CHEST:  Unremarkable HEART:  PMI not  displaced or sustained,S1 and S2 within normal limits, no S3, no S4, no clicks, no rubs, *** murmurs ABD:  Flat, positive bowel sounds normal in frequency in pitch, no bruits, no rebound, no guarding, no midline pulsatile mass, no hepatomegaly, no splenomegaly EXT:  2 plus pulses throughout, no edema, no cyanosis no clubbing    ***GEN:  No distress NECK:  No jugular venous distention at 90 degrees, waveform within normal limits, carotid upstroke brisk and symmetric, no bruits, no thyromegaly LYMPHATICS:  No cervical adenopathy LUNGS:  Clear to auscultation bilaterally BACK:  No CVA tenderness CHEST:  Unremarkable HEART:  S1 and S2 within normal limits, no S3, no S4, no clicks, no rubs, no murmurs ABD:  Positive bowel sounds normal in frequency in pitch, no bruits, no rebound, no guarding, unable to assess midline mass or bruit with the patient seated. EXT:  2 plus pulses throughout, moderate edema, no cyanosis no clubbing SKIN:  No rashes no nodules NEURO:  Cranial nerves II through XII grossly intact, motor grossly intact throughout PSYCH:  Cognitively intact, oriented to person place and time   EKG:  EKG is *** ordered today. The ekg ordered today demonstrates sinus rhythm, rate ***, right bundle, no acute ST-T wave changes.   Recent Labs: 02/10/2020: B Natriuretic Peptide 304.9; Magnesium 1.9 04/19/2020: ALT 8; TSH 1.66 08/02/2020: BUN 19; Creatinine, Ser 1.06; Hemoglobin 9.4; Platelets 147; Potassium 4.5; Sodium 139    Lipid Panel    Component Value Date/Time   CHOL 169 06/25/2015 0410   TRIG 61 06/25/2015 0410   HDL 57 06/25/2015 0410   CHOLHDL 3.0 06/25/2015 0410   VLDL 12 06/25/2015 0410   LDLCALC 100 (H) 06/25/2015 0410   LDLDIRECT 114.8 01/10/2011 0923      Wt Readings from Last 3 Encounters:  04/19/20 200 lb (90.7 kg)  02/07/20 167 lb 12.3 oz (76.1 kg)  01/29/20 178 lb 6.4 oz (80.9 kg)      Other studies Reviewed: Additional studies/ records that were reviewed  today include: ***. Review of the above  records demonstrates:  Please see elsewhere in the note.     ASSESSMENT AND PLAN:  CAD:  ***   She has no symptoms consistent with a previous angina.  No change in therapy.  She did not want a participating cardiac rehab.   Hyperlipidemia:  ***  She should have her lipids checked in about 2 months.  We will do this when she comes back.   Hypertension:   The blood pressure is *** controlled.  No change in therapy.  Palpitations: ***  I am going to apply a 2-week monitor.  Further management will be based on these results.   Current medicines are reviewed at length with the patient today.  The patient does not have concerns regarding medicines.  The following changes have been made:  ***  Labs/ tests ordered today include: ***  No orders of the defined types were placed in this encounter.    Disposition:   FU with ***    Signed, Rollene Rotunda, MD  09/07/2020 8:24 PM     [facility]

## 2020-09-08 ENCOUNTER — Ambulatory Visit: Payer: Medicare HMO | Admitting: Cardiology

## 2020-09-08 DIAGNOSIS — I2510 Atherosclerotic heart disease of native coronary artery without angina pectoris: Secondary | ICD-10-CM

## 2020-09-08 DIAGNOSIS — R002 Palpitations: Secondary | ICD-10-CM

## 2020-09-08 DIAGNOSIS — E785 Hyperlipidemia, unspecified: Secondary | ICD-10-CM

## 2020-09-08 DIAGNOSIS — I10 Essential (primary) hypertension: Secondary | ICD-10-CM

## 2020-09-15 DIAGNOSIS — M170 Bilateral primary osteoarthritis of knee: Secondary | ICD-10-CM | POA: Diagnosis not present

## 2020-09-19 ENCOUNTER — Other Ambulatory Visit: Payer: Self-pay | Admitting: Cardiology

## 2020-09-19 MED ORDER — CARVEDILOL 25 MG PO TABS
25.0000 mg | ORAL_TABLET | Freq: Two times a day (BID) | ORAL | 3 refills | Status: DC
Start: 2020-09-19 — End: 2020-09-26

## 2020-09-19 NOTE — Telephone Encounter (Signed)
*  STAT* If patient is at the pharmacy, call can be transferred to refill team.   1. Which medications need to be refilled? (please list name of each medication and dose if known)  carvedilol (COREG) 25 MG tablet clopidogrel (PLAVIX) 75 MG tablet  2. Which pharmacy/location (including street and city if local pharmacy) is medication to be sent to?  [facility] - [place], [place] - [address]  3. Do they need a 30 day or 90 day supply? Alden Server with [facility] is requesting to have an emergency supply of medication sent to patient's local pharmacy to hold her over until mail order arrives. Patient is completely out of medication.

## 2020-09-19 NOTE — Addendum Note (Signed)
Addended by: Brunetta Genera on: 09/19/2020 04:33 PM   Modules accepted: Orders

## 2020-09-21 ENCOUNTER — Other Ambulatory Visit: Payer: Self-pay | Admitting: Internal Medicine

## 2020-09-21 NOTE — Telephone Encounter (Signed)
Medis rx by pt cardiologist. Ames Coupe to Dr. Antoine Poche for approval../lmb

## 2020-09-22 ENCOUNTER — Encounter: Payer: Self-pay | Admitting: Internal Medicine

## 2020-09-22 ENCOUNTER — Other Ambulatory Visit: Payer: Self-pay | Admitting: Cardiology

## 2020-09-22 NOTE — Telephone Encounter (Signed)
error 

## 2020-09-22 NOTE — Telephone Encounter (Signed)
*  STAT* If patient is at the pharmacy, call can be transferred to refill team.   1. Which medications need to be refilled? (please list name of each medication and dose if known)  carvedilol (COREG) 25 MG tablet  2. Which pharmacy/location (including street and city if local pharmacy) is medication to be sent to? [facility] - [place], [place] - [address]  3. Do they need a 30 day or 90 day supply? 7 day supply  Pt is completely out of medication and is waiting on [facility] delivery

## 2020-09-23 ENCOUNTER — Other Ambulatory Visit: Payer: Self-pay | Admitting: Cardiology

## 2020-09-23 NOTE — Telephone Encounter (Signed)
*  STAT* If patient is at the pharmacy, call can be transferred to refill team.   1. Which medications need to be refilled? (please list name of each medication and dose if known)  carvedilol (COREG) 25 MG tablet  2. Which pharmacy/location (including street and city if local pharmacy) is medication to be sent to? [facility] - [place], [place] - [address]  3. Do they need a 30 day or 90 day supply? 14 days   Patient is out of medication.  The patient would like a short term supply sent to her local [facility] to tide her over until the other medication comes in the mail

## 2020-09-26 MED ORDER — CARVEDILOL 25 MG PO TABS
25.0000 mg | ORAL_TABLET | Freq: Two times a day (BID) | ORAL | 0 refills | Status: DC
Start: 2020-09-26 — End: 2021-01-16

## 2020-10-19 DIAGNOSIS — M1990 Unspecified osteoarthritis, unspecified site: Secondary | ICD-10-CM | POA: Diagnosis not present

## 2020-10-19 DIAGNOSIS — Z79899 Other long term (current) drug therapy: Secondary | ICD-10-CM | POA: Diagnosis not present

## 2020-10-19 DIAGNOSIS — M0579 Rheumatoid arthritis with rheumatoid factor of multiple sites without organ or systems involvement: Secondary | ICD-10-CM | POA: Diagnosis not present

## 2020-10-19 DIAGNOSIS — M79643 Pain in unspecified hand: Secondary | ICD-10-CM | POA: Diagnosis not present

## 2020-10-26 NOTE — Progress Notes (Addendum)
Cardiology Office Note   Date:  10/28/2020   ID:  [name], DOB [birth year], MRN [id number]  PCP:  Tresa Garter, MD  Cardiologist:   Rollene Rotunda, MD   Chief Complaint  Patient presents with  . Dizziness  . Fatigue      History of Present Illness: [name] is a 86 y.o. female of CAD with cardiac catheterization revealing two-vessel CAD with widely patent previously placed stent to the LAD with culprit lesion felt to be an ulcerated thrombotic irregular proximal to mid RCA lesion.  She underwent a successful but complex PCI/DES of the mid RCA x1.    She was placed on dual antiplatelet therapy with aspirin and Plavix for minimum of 1 year.  She had a follow-up echocardiogram which revealed an EF of 60% to 65% with normal LV function and grade 1 diastolic dysfunction with mildly dilated atria.  She was also placed on high-dose statin, atorvastatin 40 mg at at bedtime, continued on her home dose of carvedilol 25 mg twice daily.  She was noted to have some transient confusion during her hospitalization, but had returned to normal at discharge.  At the last appt she was describing palpitations.  She complains today that she does get some increased heart rate when she moves about in particular.  She describes some shortness of breath.  She is very anxious.  She is very fatigued.  She has a husband with dementia and she never sleeps.  She is not really describing any chest pressure, neck or arm discomfort.  She is not having any new PND or orthopnea.  Has had no weight gain or edema.  She gets around slowly in her house.   Past Medical History:  Diagnosis Date  . Anemia    iron deficiency  . Aneurysm, thoracic aortic (HCC)   . Anxiety   . B12 deficiency   . CAD (coronary artery disease)    s/p stenting of LAD 1999- cath 5-08 EF normal LAD 30-40% restenosis. D1 50% D2 80% LCX & RCA minimal plaque  . Chronic back pain   . CKD (chronic kidney disease) stage 3,  GFR 30-59 ml/min (HCC) 08/29/2020  . Constipation   . Depression   . Diabetes mellitus   . DVT (deep venous thrombosis) (HCC)   . GERD (gastroesophageal reflux disease)   . Gout   . HTN (hypertension)   . Hyperlipemia   . Obesity   . Osteoporosis   . Pancreatitis   . Polyarthritis    DJD/ possible PMR  . Renal insufficiency    Cr 1.2-1.3  . Seizures (HCC)   . Tinnitus   . Urinary frequency   . Vertigo   . Vitamin D deficiency     Past Surgical History:  Procedure Laterality Date  . ABDOMINAL HYSTERECTOMY    . CARDIAC CATHETERIZATION  2008   L main 20%, LAD stent patent, D1 50%, D2 80% (small), RCA 20%, EF 55-60%  . CHOLECYSTECTOMY    . CORONARY ANGIOPLASTY WITH STENT PLACEMENT  1999   LAD stent  . CORONARY STENT INTERVENTION N/A 10/16/2019   Procedure: CORONARY STENT INTERVENTION;  Surgeon: Marykay Lex, MD;  Location: [facility] INVASIVE CV LAB;  Service: Cardiovascular;  Laterality: N/A;  . HEMORRHOID SURGERY    . LAPAROSCOPIC LYSIS OF ADHESIONS N/A 03/24/2015   Procedure: LAPAROSCOPIC LYSIS OF ADHESIONS;  Surgeon: Luretha Murphy, MD;  Location: [facility];  Service: General;  Laterality: N/A;  . LAPAROSCOPY N/A  03/24/2015   Procedure: LAPAROSCOPY DIAGNOSTIC;  Surgeon: Luretha Murphy, MD;  Location: [facility];  Service: General;  Laterality: N/A;  . LAPAROTOMY N/A 03/24/2015   Procedure: LAPAROTOMY with decompression of bowel;  Surgeon: Luretha Murphy, MD;  Location: [facility];  Service: General;  Laterality: N/A;  . LEFT HEART CATH AND CORONARY ANGIOGRAPHY N/A 10/16/2019   Procedure: LEFT HEART CATH AND CORONARY ANGIOGRAPHY;  Surgeon: Marykay Lex, MD;  Location: [facility] INVASIVE CV LAB;  Service: Cardiovascular;  Laterality: N/A;  . TUBAL LIGATION       Current Outpatient Medications  Medication Sig Dispense Refill  . acetaminophen (TYLENOL) 650 MG CR tablet Take 1,300 mg by mouth every 8 (eight) hours as needed for pain.    . Alcohol Swabs (B-D SINGLE USE SWABS REGULAR) PADS Use to  clean area to check blood sugars twice a day 300 each 1  . amLODipine (NORVASC) 10 MG tablet Take 1 tablet (10 mg total) by mouth daily.    . bisacodyl (BISACODYL) 5 MG EC tablet Take 5-10 mg by mouth daily as needed for moderate constipation.    . Blood Glucose Calibration (ACCU-CHEK AVIVA) SOLN Use as directed (Patient taking differently: 1 each by Other route See admin instructions. Use as directed per physician) 3 each 1  . brimonidine (ALPHAGAN) 0.2 % ophthalmic solution Place 1 drop into both eyes 2 (two) times daily.    . carvedilol (COREG) 25 MG tablet Take 1 tablet (25 mg total) by mouth 2 (two) times daily with a meal. 60 tablet 0  . cholecalciferol (VITAMIN D) 25 MCG (1000 UNIT) tablet Take 1,000 Units by mouth daily.    . citalopram (CELEXA) 10 MG tablet Take 1 tablet (10 mg total) by mouth daily. 90 tablet 3  . Cyanocobalamin (VITAMIN B-12) 3000 MCG SUBL Place 3,000 mcg under the tongue daily.    . feeding supplement, GLUCERNA SHAKE, (GLUCERNA SHAKE) LIQD Take 237 mLs by mouth 3 (three) times daily between meals.  0  . glucose blood (ONETOUCH VERIO) test strip Use as instructed (Patient taking differently: 1 each by Other route See admin instructions. Use as instructed for blood sugar) 300 each 3  . guaiFENesin-dextromethorphan (ROBITUSSIN DM) 100-10 MG/5ML syrup Take 10 mLs by mouth every 4 (four) hours as needed for cough. 118 mL 0  . levETIRAcetam (KEPPRA) 250 MG tablet Take 1 tablet (250 mg total) by mouth 2 (two) times daily. 180 tablet 1  . levETIRAcetam (KEPPRA) 500 MG tablet Take 500 mg by mouth 2 (two) times daily.    . meclizine (ANTIVERT) 25 MG tablet TAKE 1/2 TO 1 (ONE-HALF TO ONE) TABLET BY MOUTH THREE TIMES DAILY 45 tablet 0  . nitroGLYCERIN (NITROSTAT) 0.4 MG SL tablet Place 1 tablet (0.4 mg total) under the tongue every 5 (five) minutes x 3 doses as needed for chest pain. 25 tablet 3  . ondansetron (ZOFRAN) 4 MG tablet TAKE 1 TABLET BY MOUTH TWICE DAILY AS NEEDED FOR  NAUSEA OR VOMITING 30 tablet 0  . ONETOUCH DELICA LANCETS FINE MISC 1 Device by Does not apply route daily as needed. (Patient taking differently: 1 Device by Does not apply route daily as needed (blood glucose).) 100 each 3  . pantoprazole (PROTONIX) 40 MG tablet Take 1 tablet (40 mg total) by mouth daily. 30 tablet 0  . aspirin EC 81 MG tablet Take 1 tablet (81 mg total) by mouth daily. Swallow whole. 90 tablet 3  . rosuvastatin (CRESTOR) 20 MG tablet Take 1  tablet (20 mg total) by mouth daily. 90 tablet 3   No current facility-administered medications for this visit.    Allergies:   Ativan [lorazepam], Tramadol hcl, Amlodipine besylate, Atenolol, Benazepril, Benicar [olmesartan medoxomil], Cozaar, Hydralazine, Hydrochlorothiazide w-triamterene, Hydrocodone, Hydroxyzine pamoate, Iodine, Lisinopril, Peach flavor, Penicillins, Pravastatin, Prednisolone, Strawberry flavor, Tramadol hcl, and Benadryl [diphenhydramine]    ROS:  Please see the history of present illness.   Otherwise, review of systems are positive for none.   All other systems are reviewed and negative.    PHYSICAL EXAM: VS:  BP 130/72   Pulse 63   Temp (!) 97 F (36.1 C)   Ht 5\' 4"  (1.626 m)   SpO2 95%   BMI 34.33 kg/m  , BMI Body mass index is 34.33 kg/m. GEN:  No distress NECK:  No jugular venous distention at 90 degrees, waveform within normal limits, carotid upstroke brisk and symmetric, no bruits, no thyromegaly LYMPHATICS:  No cervical adenopathy LUNGS:  Clear to auscultation bilaterally BACK:  No CVA tenderness CHEST:  Unremarkable HEART:  S1 and S2 within normal limits, no S3, no S4, no clicks, no rubs, no murmurs ABD:  Positive bowel sounds normal in frequency in pitch, no bruits, no rebound, no guarding, unable to assess midline mass or bruit with the patient seated. EXT:  2 plus pulses throughout, trace edema, no cyanosis no clubbing SKIN:  No rashes no nodules NEURO:  Cranial nerves II through XII grossly  intact, motor grossly intact throughout PSYCH:  Cognitively intact, oriented to person place and time   EKG:  EKG is  ordered today. The ekg ordered today demonstrates sinus rhythm, rate 72, right bundle, no acute ST-T wave changes.   Recent Labs: 02/10/2020: B Natriuretic Peptide 304.9; Magnesium 1.9 04/19/2020: ALT 8; TSH 1.66 08/02/2020: BUN 19; Creatinine, Ser 1.06; Hemoglobin 9.4; Platelets 147; Potassium 4.5; Sodium 139    Lipid Panel    Component Value Date/Time   CHOL 169 06/25/2015 0410   TRIG 61 06/25/2015 0410   HDL 57 06/25/2015 0410   CHOLHDL 3.0 06/25/2015 0410   VLDL 12 06/25/2015 0410   LDLCALC 100 (H) 06/25/2015 0410   LDLDIRECT 114.8 01/10/2011 0923      Wt Readings from Last 3 Encounters:  04/19/20 200 lb (90.7 kg)  02/07/20 167 lb 12.3 oz (76.1 kg)  01/29/20 178 lb 6.4 oz (80.9 kg)    Diagnostic Dominance: Right    Intervention       Other studies Reviewed: Additional studies/ records that were reviewed today include: Labs. Review of the above records demonstrates:  Please see elsewhere in the note.     ASSESSMENT AND PLAN:  CAD:   At this point I do not think there is an anginal equivalent.  She can stop her Plavix.  I reviewed her images with her.  She can go down to 81 mg aspirin.     Hyperlipidemia: I will bring her back for fasting lipid profile.   DM:  A1c was 7.1.  I sent a message to her primary care to see if she should be back on Metformin or other.  Apparently this was discontinued I think because her hemoglobin A1c was low.  ADDENDUM:  I messaged Plotnikov, 01/31/20, MD and he reported that he had taken her off of diabetes meds because her diabetes had been under control and she is not compliant with meds.    Fatigue:  She does not get to sleep.  I think this is the  biggest issue.  She clearly has stress.  Hypertension: Controlled.  She will continue the meds as listed.   Palpitations: I am going to apply a 2-week monitor.    SOB: I have asked him to get a pulse oximeter.  I will check a BNP level.  I do not think this is heart failure or angina however.  Covid education:   She has had the Covid vaccines.   Current medicines are reviewed at length with the patient today.  The patient does not have concerns regarding medicines.  The following changes have been made:  As aobve  Labs/ tests ordered today include:   Orders Placed This Encounter  Procedures  . Lipid Profile  . Basic metabolic panel  . LONG TERM MONITOR (3-14 DAYS)  . EKG 12-Lead     Disposition:   FU with APP or me after the monitor and the labs probably in 6 weeks.     Signed, Rollene Rotunda, MD  10/28/2020 9:08 AM    [facility]

## 2020-10-27 ENCOUNTER — Encounter: Payer: Self-pay | Admitting: Cardiology

## 2020-10-27 ENCOUNTER — Other Ambulatory Visit: Payer: Self-pay

## 2020-10-27 ENCOUNTER — Ambulatory Visit: Payer: Medicare HMO | Admitting: Cardiology

## 2020-10-27 VITALS — BP 130/72 | HR 63 | Temp 97.0°F | Ht 64.0 in

## 2020-10-27 DIAGNOSIS — Z79899 Other long term (current) drug therapy: Secondary | ICD-10-CM

## 2020-10-27 DIAGNOSIS — R002 Palpitations: Secondary | ICD-10-CM | POA: Diagnosis not present

## 2020-10-27 DIAGNOSIS — E785 Hyperlipidemia, unspecified: Secondary | ICD-10-CM

## 2020-10-27 MED ORDER — ASPIRIN EC 81 MG PO TBEC: 81.0000 mg | DELAYED_RELEASE_TABLET | Freq: Every day | ORAL | 3 refills | Status: DC

## 2020-10-27 NOTE — Patient Instructions (Signed)
Medication Instructions:  STOP PLAVIX  START TAKING EXTENDED RELEASE ASPIRIN 81MG  ONLY *If you need a refill on your cardiac medications before your next appointment, please call your pharmacy*  Lab Work: FASTING LIPID AND BMET THE WEEK BEFORE YOUR FOLLOW UP APPOINTMENT  If you have labs (blood work) drawn today and your tests are completely normal, you will receive your results only by:  MyChart Message (if you have MyChart) OR A paper copy in the mail.  If you have any lab test that is abnormal or we need to change your treatment, we will call you to review the results. You may go to any [facility] that is convenient for you however, we do have a lab in our office that is able to assist you. You DO NOT need an appointment for our lab. The lab is open 8:00am and closes at 4:00pm. Lunch 12:45 - 1:45pm.  Testing/Procedures: SOMEONE WILL BE CALLING YOU TO DISCUSS YOUR 14 DAY MONITOR  MAKE SURE TO BUY AND USE PULSE OXIMETER  Follow-Up: Your next appointment:  6 week(s) In Person with , MD   At [facility], you and your health needs are our priority.  As part of our continuing mission to provide you with exceptional heart care, we have created designated Provider Care Teams.  These Care Teams include your primary Cardiologist (physician) and Advanced Practice Providers (APPs -  Physician Assistants and Nurse Practitioners) who all work together to provide you with the care you need, when you need it.

## 2020-10-28 ENCOUNTER — Ambulatory Visit (INDEPENDENT_AMBULATORY_CARE_PROVIDER_SITE_OTHER): Payer: Medicare HMO

## 2020-10-28 ENCOUNTER — Encounter: Payer: Self-pay | Admitting: Cardiology

## 2020-10-28 DIAGNOSIS — R002 Palpitations: Secondary | ICD-10-CM

## 2020-12-07 ENCOUNTER — Telehealth: Payer: Self-pay | Admitting: Internal Medicine
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
## 2020-12-09 ENCOUNTER — Ambulatory Visit (INDEPENDENT_AMBULATORY_CARE_PROVIDER_SITE_OTHER): Payer: Medicare HMO | Admitting: Family

## 2020-12-09 ENCOUNTER — Other Ambulatory Visit: Payer: Self-pay

## 2020-12-09 ENCOUNTER — Encounter: Payer: Self-pay | Admitting: Family

## 2020-12-09 VITALS — BP 168/84 | HR 66 | Temp 98.1°F

## 2020-12-09 DIAGNOSIS — T24012A Burn of unspecified degree of left thigh, initial encounter: Secondary | ICD-10-CM

## 2020-12-09 DIAGNOSIS — X100XXA Contact with hot drinks, initial encounter: Secondary | ICD-10-CM | POA: Diagnosis not present

## 2020-12-09 DIAGNOSIS — T300 Burn of unspecified body region, unspecified degree: Secondary | ICD-10-CM

## 2020-12-09 MED ORDER — SILVER SULFADIAZINE 1 % EX CREA: 1.0000 "application " | TOPICAL_CREAM | Freq: Two times a day (BID) | CUTANEOUS | 0 refills | Status: DC

## 2020-12-09 MED ORDER — DOXYCYCLINE HYCLATE 100 MG PO TABS: 100.0000 mg | ORAL_TABLET | Freq: Two times a day (BID) | ORAL | 0 refills | Status: DC
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
## 2020-12-12 ENCOUNTER — Ambulatory Visit: Payer: Medicare HMO | Admitting: Family

## 2020-12-30 ENCOUNTER — Ambulatory Visit: Payer: Medicare HMO | Admitting: Cardiology

## 2021-01-03 DIAGNOSIS — R002 Palpitations: Secondary | ICD-10-CM

## 2021-01-08 ENCOUNTER — Emergency Department (HOSPITAL_COMMUNITY): Payer: Medicare HMO

## 2021-01-08 ENCOUNTER — Other Ambulatory Visit: Payer: Self-pay

## 2021-01-08 ENCOUNTER — Encounter (HOSPITAL_COMMUNITY): Payer: Self-pay

## 2021-01-08 ENCOUNTER — Inpatient Hospital Stay (HOSPITAL_COMMUNITY)
Admission: EM | Admit: 2021-01-08 | Discharge: 2021-01-16 | DRG: 100 | Disposition: A | Discharge status: Skilled Nursing Facility | Payer: Medicare HMO | Attending: Internal Medicine | Admitting: Internal Medicine

## 2021-01-08 DIAGNOSIS — Z833 Family history of diabetes mellitus: Secondary | ICD-10-CM | POA: Diagnosis not present

## 2021-01-08 DIAGNOSIS — I130 Hypertensive heart and chronic kidney disease with heart failure and stage 1 through stage 4 chronic kidney disease, or unspecified chronic kidney disease: Secondary | ICD-10-CM | POA: Diagnosis present

## 2021-01-08 DIAGNOSIS — E1122 Type 2 diabetes mellitus with diabetic chronic kidney disease: Secondary | ICD-10-CM | POA: Diagnosis present

## 2021-01-08 DIAGNOSIS — M170 Bilateral primary osteoarthritis of knee: Secondary | ICD-10-CM | POA: Diagnosis present

## 2021-01-08 DIAGNOSIS — M549 Dorsalgia, unspecified: Secondary | ICD-10-CM | POA: Diagnosis not present

## 2021-01-08 DIAGNOSIS — Z8249 Family history of ischemic heart disease and other diseases of the circulatory system: Secondary | ICD-10-CM

## 2021-01-08 DIAGNOSIS — Z91128 Patient's intentional underdosing of medication regimen for other reason: Secondary | ICD-10-CM

## 2021-01-08 DIAGNOSIS — R1084 Generalized abdominal pain: Secondary | ICD-10-CM | POA: Diagnosis not present

## 2021-01-08 DIAGNOSIS — Y92009 Unspecified place in unspecified non-institutional (private) residence as the place of occurrence of the external cause: Secondary | ICD-10-CM | POA: Diagnosis not present

## 2021-01-08 DIAGNOSIS — R2681 Unsteadiness on feet: Secondary | ICD-10-CM | POA: Diagnosis not present

## 2021-01-08 DIAGNOSIS — R8271 Bacteriuria: Secondary | ICD-10-CM | POA: Diagnosis present

## 2021-01-08 DIAGNOSIS — K219 Gastro-esophageal reflux disease without esophagitis: Secondary | ICD-10-CM | POA: Diagnosis present

## 2021-01-08 DIAGNOSIS — G40901 Epilepsy, unspecified, not intractable, with status epilepticus: Principal | ICD-10-CM | POA: Diagnosis present

## 2021-01-08 DIAGNOSIS — R41841 Cognitive communication deficit: Secondary | ICD-10-CM | POA: Diagnosis not present

## 2021-01-08 DIAGNOSIS — N1831 Chronic kidney disease, stage 3a: Secondary | ICD-10-CM | POA: Diagnosis present

## 2021-01-08 DIAGNOSIS — I5032 Chronic diastolic (congestive) heart failure: Secondary | ICD-10-CM

## 2021-01-08 DIAGNOSIS — Z90710 Acquired absence of both cervix and uterus: Secondary | ICD-10-CM

## 2021-01-08 DIAGNOSIS — Z20822 Contact with and (suspected) exposure to covid-19: Secondary | ICD-10-CM | POA: Diagnosis present

## 2021-01-08 DIAGNOSIS — I10 Essential (primary) hypertension: Secondary | ICD-10-CM | POA: Diagnosis not present

## 2021-01-08 DIAGNOSIS — F32A Depression, unspecified: Secondary | ICD-10-CM | POA: Diagnosis present

## 2021-01-08 DIAGNOSIS — M810 Age-related osteoporosis without current pathological fracture: Secondary | ICD-10-CM | POA: Diagnosis present

## 2021-01-08 DIAGNOSIS — Z888 Allergy status to other drugs, medicaments and biological substances status: Secondary | ICD-10-CM

## 2021-01-08 DIAGNOSIS — Z8673 Personal history of transient ischemic attack (TIA), and cerebral infarction without residual deficits: Secondary | ICD-10-CM

## 2021-01-08 DIAGNOSIS — Z9049 Acquired absence of other specified parts of digestive tract: Secondary | ICD-10-CM

## 2021-01-08 DIAGNOSIS — M6281 Muscle weakness (generalized): Secondary | ICD-10-CM | POA: Diagnosis not present

## 2021-01-08 DIAGNOSIS — Z885 Allergy status to narcotic agent status: Secondary | ICD-10-CM | POA: Diagnosis not present

## 2021-01-08 DIAGNOSIS — Z87898 Personal history of other specified conditions: Secondary | ICD-10-CM | POA: Diagnosis not present

## 2021-01-08 DIAGNOSIS — T426X6A Underdosing of other antiepileptic and sedative-hypnotic drugs, initial encounter: Secondary | ICD-10-CM | POA: Diagnosis present

## 2021-01-08 DIAGNOSIS — I2510 Atherosclerotic heart disease of native coronary artery without angina pectoris: Secondary | ICD-10-CM | POA: Diagnosis present

## 2021-01-08 DIAGNOSIS — B9620 Unspecified Escherichia coli [E. coli] as the cause of diseases classified elsewhere: Secondary | ICD-10-CM | POA: Diagnosis present

## 2021-01-08 DIAGNOSIS — G9341 Metabolic encephalopathy: Secondary | ICD-10-CM | POA: Diagnosis present

## 2021-01-08 DIAGNOSIS — Z955 Presence of coronary angioplasty implant and graft: Secondary | ICD-10-CM

## 2021-01-08 DIAGNOSIS — R002 Palpitations: Secondary | ICD-10-CM | POA: Insufficient documentation

## 2021-01-08 DIAGNOSIS — I700 Atherosclerosis of aorta: Secondary | ICD-10-CM | POA: Diagnosis not present

## 2021-01-08 DIAGNOSIS — N183 Chronic kidney disease, stage 3 unspecified: Secondary | ICD-10-CM | POA: Diagnosis not present

## 2021-01-08 DIAGNOSIS — T364X6A Underdosing of tetracyclines, initial encounter: Secondary | ICD-10-CM | POA: Diagnosis present

## 2021-01-08 DIAGNOSIS — R29810 Facial weakness: Secondary | ICD-10-CM | POA: Diagnosis not present

## 2021-01-08 DIAGNOSIS — G8929 Other chronic pain: Secondary | ICD-10-CM | POA: Diagnosis present

## 2021-01-08 DIAGNOSIS — X088XXD Exposure to other specified smoke, fire and flames, subsequent encounter: Secondary | ICD-10-CM | POA: Diagnosis present

## 2021-01-08 DIAGNOSIS — R279 Unspecified lack of coordination: Secondary | ICD-10-CM | POA: Diagnosis not present

## 2021-01-08 DIAGNOSIS — Z743 Need for continuous supervision: Secondary | ICD-10-CM | POA: Diagnosis not present

## 2021-01-08 DIAGNOSIS — I517 Cardiomegaly: Secondary | ICD-10-CM | POA: Diagnosis not present

## 2021-01-08 DIAGNOSIS — R410 Disorientation, unspecified: Secondary | ICD-10-CM | POA: Diagnosis not present

## 2021-01-08 DIAGNOSIS — M069 Rheumatoid arthritis, unspecified: Secondary | ICD-10-CM | POA: Diagnosis present

## 2021-01-08 DIAGNOSIS — E1121 Type 2 diabetes mellitus with diabetic nephropathy: Secondary | ICD-10-CM

## 2021-01-08 DIAGNOSIS — Z79899 Other long term (current) drug therapy: Secondary | ICD-10-CM

## 2021-01-08 DIAGNOSIS — T24012D Burn of unspecified degree of left thigh, subsequent encounter: Secondary | ICD-10-CM

## 2021-01-08 DIAGNOSIS — R531 Weakness: Secondary | ICD-10-CM | POA: Diagnosis not present

## 2021-01-08 DIAGNOSIS — E785 Hyperlipidemia, unspecified: Secondary | ICD-10-CM | POA: Diagnosis present

## 2021-01-08 DIAGNOSIS — J690 Pneumonitis due to inhalation of food and vomit: Secondary | ICD-10-CM | POA: Diagnosis present

## 2021-01-08 DIAGNOSIS — R1312 Dysphagia, oropharyngeal phase: Secondary | ICD-10-CM | POA: Diagnosis not present

## 2021-01-08 DIAGNOSIS — Z86718 Personal history of other venous thrombosis and embolism: Secondary | ICD-10-CM | POA: Diagnosis not present

## 2021-01-08 DIAGNOSIS — R2689 Other abnormalities of gait and mobility: Secondary | ICD-10-CM | POA: Diagnosis not present

## 2021-01-08 DIAGNOSIS — R12 Heartburn: Secondary | ICD-10-CM | POA: Diagnosis not present

## 2021-01-08 DIAGNOSIS — R4182 Altered mental status, unspecified: Secondary | ICD-10-CM | POA: Diagnosis present

## 2021-01-08 DIAGNOSIS — I6782 Cerebral ischemia: Secondary | ICD-10-CM | POA: Diagnosis not present

## 2021-01-08 DIAGNOSIS — Z7982 Long term (current) use of aspirin: Secondary | ICD-10-CM

## 2021-01-08 DIAGNOSIS — I4510 Unspecified right bundle-branch block: Secondary | ICD-10-CM | POA: Diagnosis not present

## 2021-01-08 DIAGNOSIS — R498 Other voice and resonance disorders: Secondary | ICD-10-CM | POA: Diagnosis not present

## 2021-01-08 DIAGNOSIS — Z880 Allergy status to penicillin: Secondary | ICD-10-CM

## 2021-01-08 DIAGNOSIS — R471 Dysarthria and anarthria: Secondary | ICD-10-CM | POA: Diagnosis present

## 2021-01-08 DIAGNOSIS — Z9109 Other allergy status, other than to drugs and biological substances: Secondary | ICD-10-CM

## 2021-01-08 DIAGNOSIS — R569 Unspecified convulsions: Secondary | ICD-10-CM

## 2021-01-08 DIAGNOSIS — I959 Hypotension, unspecified: Secondary | ICD-10-CM | POA: Diagnosis not present

## 2021-01-08 DIAGNOSIS — T25221S Burn of second degree of right foot, sequela: Secondary | ICD-10-CM | POA: Diagnosis not present

## 2021-01-08 LAB — CBC WITH DIFFERENTIAL/PLATELET
Abs Immature Granulocytes: 0.02 10*3/uL (ref 0.00–0.07)
Basophils Absolute: 0 10*3/uL (ref 0.0–0.1)
Basophils Relative: 0 %
Eosinophils Absolute: 0 10*3/uL (ref 0.0–0.5)
Eosinophils Relative: 1 %
HCT: 28.8 % — ABNORMAL LOW (ref 36.0–46.0)
Hemoglobin: 8.9 g/dL — ABNORMAL LOW (ref 12.0–15.0)
Immature Granulocytes: 0 %
Lymphocytes Relative: 17 %
Lymphs Abs: 1.1 10*3/uL (ref 0.7–4.0)
MCH: 30.8 pg (ref 26.0–34.0)
MCHC: 30.9 g/dL (ref 30.0–36.0)
MCV: 99.7 fL (ref 80.0–100.0)
Monocytes Absolute: 0.6 10*3/uL (ref 0.1–1.0)
Monocytes Relative: 9 %
Neutro Abs: 4.8 10*3/uL (ref 1.7–7.7)
Neutrophils Relative %: 73 %
Platelets: 169 10*3/uL (ref 150–400)
RBC: 2.89 MIL/uL — ABNORMAL LOW (ref 3.87–5.11)
RDW: 12.5 % (ref 11.5–15.5)
WBC: 6.6 10*3/uL (ref 4.0–10.5)
nRBC: 0.3 % — ABNORMAL HIGH (ref 0.0–0.2)

## 2021-01-08 LAB — URINALYSIS, COMPLETE (UACMP) WITH MICROSCOPIC
Bacteria, UA: NONE SEEN
Bilirubin Urine: NEGATIVE
Glucose, UA: NEGATIVE mg/dL
Hgb urine dipstick: NEGATIVE
Ketones, ur: NEGATIVE mg/dL
Leukocytes,Ua: NEGATIVE
Nitrite: NEGATIVE
Protein, ur: 100 mg/dL — AB
Specific Gravity, Urine: 1.014 (ref 1.005–1.030)
pH: 6 (ref 5.0–8.0)

## 2021-01-08 LAB — APTT: aPTT: 54 seconds — ABNORMAL HIGH (ref 24–36)

## 2021-01-08 LAB — RAPID URINE DRUG SCREEN, HOSP PERFORMED
Amphetamines: NOT DETECTED
Barbiturates: NOT DETECTED
Benzodiazepines: NOT DETECTED
Cocaine: NOT DETECTED
Opiates: NOT DETECTED
Tetrahydrocannabinol: NOT DETECTED

## 2021-01-08 LAB — COMPREHENSIVE METABOLIC PANEL
ALT: 11 U/L (ref 0–44)
AST: 25 U/L (ref 15–41)
Albumin: 3.4 g/dL — ABNORMAL LOW (ref 3.5–5.0)
Alkaline Phosphatase: 58 U/L (ref 38–126)
Anion gap: 11 (ref 5–15)
BUN: 19 mg/dL (ref 8–23)
CO2: 19 mmol/L — ABNORMAL LOW (ref 22–32)
Calcium: 9.1 mg/dL (ref 8.9–10.3)
Chloride: 105 mmol/L (ref 98–111)
Creatinine, Ser: 1 mg/dL (ref 0.44–1.00)
GFR, Estimated: 55 mL/min — ABNORMAL LOW (ref >60)
Glucose, Bld: 108 mg/dL — ABNORMAL HIGH (ref 70–99)
Potassium: 4.1 mmol/L (ref 3.5–5.1)
Sodium: 135 mmol/L (ref 135–145)
Total Bilirubin: 1.2 mg/dL (ref 0.3–1.2)
Total Protein: 7 g/dL (ref 6.5–8.1)

## 2021-01-08 LAB — PROTIME-INR
INR: 1.2 (ref 0.8–1.2)
Prothrombin Time: 14.3 seconds (ref 11.4–15.2)

## 2021-01-08 LAB — TROPONIN I (HIGH SENSITIVITY): Troponin I (High Sensitivity): 53 ng/L — ABNORMAL HIGH (ref <18)

## 2021-01-08 LAB — LACTIC ACID, PLASMA: Lactic Acid, Venous: 1 mmol/L (ref 0.5–1.9)

## 2021-01-08 LAB — AMMONIA: Ammonia: 17 umol/L (ref 9–35)

## 2021-01-08 LAB — CBG MONITORING, ED: Glucose-Capillary: 102 mg/dL — ABNORMAL HIGH (ref 70–99)

## 2021-01-08 LAB — RESP PANEL BY RT-PCR (FLU A&B, COVID) ARPGX2
Influenza A by PCR: NEGATIVE
Influenza B by PCR: NEGATIVE
SARS Coronavirus 2 by RT PCR: NEGATIVE

## 2021-01-08 MED ORDER — SODIUM CHLORIDE 0.9 % IV SOLN
500.0000 mg | INTRAVENOUS | Status: DC
  Administered 2021-01-08 – 2021-01-09 (×2): 500 mg via INTRAVENOUS
  Filled 2021-01-08 (×4): qty 500

## 2021-01-08 MED ORDER — CARVEDILOL 12.5 MG PO TABS
25.0000 mg | ORAL_TABLET | Freq: Two times a day (BID) | ORAL | Status: DC
  Administered 2021-01-09 – 2021-01-14 (×11): 25 mg via ORAL
  Filled 2021-01-08 (×13): qty 2

## 2021-01-08 MED ORDER — AMLODIPINE BESYLATE 10 MG PO TABS
10.0000 mg | ORAL_TABLET | Freq: Every day | ORAL | Status: DC
  Administered 2021-01-09 – 2021-01-14 (×6): 10 mg via ORAL
  Filled 2021-01-08 (×6): qty 1

## 2021-01-08 MED ORDER — ENOXAPARIN SODIUM 40 MG/0.4ML ~~LOC~~ SOLN
40.0000 mg | Freq: Every day | SUBCUTANEOUS | Status: DC
  Administered 2021-01-09 – 2021-01-16 (×8): 40 mg via SUBCUTANEOUS
  Filled 2021-01-08 (×8): qty 0.4

## 2021-01-08 MED ORDER — SODIUM CHLORIDE 0.9 % IV SOLN
2.0000 g | INTRAVENOUS | Status: DC
  Administered 2021-01-08 – 2021-01-10 (×3): 2 g via INTRAVENOUS
  Filled 2021-01-08: qty 2
  Filled 2021-01-08: qty 20
  Filled 2021-01-08: qty 2
  Filled 2021-01-08: qty 20

## 2021-01-08 MED ORDER — LACTATED RINGERS IV SOLN: INTRAVENOUS | Status: AC

## 2021-01-08 MED ORDER — ASPIRIN EC 81 MG PO TBEC
81.0000 mg | DELAYED_RELEASE_TABLET | Freq: Every day | ORAL | Status: DC
Start: 2021-01-09 — End: 2021-01-16
  Administered 2021-01-09 – 2021-01-16 (×8): 81 mg via ORAL
  Filled 2021-01-08 (×8): qty 1

## 2021-01-08 MED ORDER — CITALOPRAM HYDROBROMIDE 10 MG PO TABS
10.0000 mg | ORAL_TABLET | Freq: Every day | ORAL | Status: DC
  Administered 2021-01-09 – 2021-01-16 (×8): 10 mg via ORAL
  Filled 2021-01-08 (×8): qty 1

## 2021-01-08 NOTE — ED Notes (Signed)
Pt arrived with dried feces on her. Cleaned, new brief, and purewick applied at this time.

## 2021-01-08 NOTE — ED Provider Notes (Signed)
[facility] EMERGENCY DEPARTMENT Provider Note   CSN: [id number] Arrival date & time: 01/08/21  1740     History Chief Complaint  Patient presents with  . Altered Mental Status  . Code Sepsis    [name] is a 87 y.o. female w/ h/o CAD s/p stent placement, HTN, HLD, T2DM, thoracic aortic aneurysm, CKD, and seizures who presents to the ED for AMS. History limited by patient's presentation. Per daughter, patient in her usual state of health when she checked on her at home. She ambulates on her own, performs ADLs, and AA&O. When daughter checked on her this evening at approximately 4PM, she found patient sitting in chair without clothes on having stooled herself. Patient oriented only to herself. She could not recognize her daughter. EMS called to scene and transported patient to ED for further evaluation. Patient confused but pointed to abdomen and stated it hurt.  The history is provided by the patient and medical records. The history is limited by the condition of the patient.  Altered Mental Status Presenting symptoms: behavior changes, confusion and lethargy   Severity:  Severe Most recent episode:  Today Episode history:  Single Duration: unsure. Timing:  Constant Progression:  Unchanged Chronicity:  New Context comment:  Unknown Associated symptoms: abdominal pain        Past Medical History:  Diagnosis Date  . Anemia    iron deficiency  . Aneurysm, thoracic aortic (HCC)   . Anxiety   . B12 deficiency   . CAD (coronary artery disease)    s/p stenting of LAD 1999- cath 5-08 EF normal LAD 30-40% restenosis. D1 50% D2 80% LCX & RCA minimal plaque  . Chronic back pain   . CKD (chronic kidney disease) stage 3, GFR 30-59 ml/min (HCC) 08/29/2020  . Constipation   . Depression   . Diabetes mellitus   . DVT (deep venous thrombosis) (HCC)   . GERD (gastroesophageal reflux disease)   . Gout   . HTN (hypertension)   . Hyperlipemia   . Obesity   .  Osteoporosis   . Pancreatitis   . Polyarthritis    DJD/ possible PMR  . Renal insufficiency    Cr 1.2-1.3  . Seizures (HCC)   . Tinnitus   . Urinary frequency   . Vertigo   . Vitamin D deficiency     Patient Active Problem List   Diagnosis Date Noted  . Palpitations 01/08/2021  . AMS (altered mental status) 01/08/2021  . History of seizure 01/08/2021  . S/P primary angioplasty with coronary stent 01/08/2021  . Chronic diastolic CHF (congestive heart failure) (HCC) 01/08/2021  . CKD (chronic kidney disease) stage 3, GFR 30-59 ml/min (HCC) 08/29/2020  . Depression 08/29/2020  . COVID-19 virus infection 02/05/2020  . Unstable angina (HCC) 10/16/2019  . Non-ST elevation (NSTEMI) myocardial infarction (HCC) 10/16/2019  . Nausea 06/23/2018  . CHF (congestive heart failure) (HCC) 04/03/2018  . AKI (acute kidney injury) (HCC) 11/20/2017  . Dehydration   . Generalized weakness 11/18/2017  . Second degree burn of foot 11/18/2017  . Seizures (HCC) 07/17/2016  . Hypertensive urgency 07/17/2016  . Acute diastolic CHF (congestive heart failure) (HCC) 07/17/2016  . UTI (urinary tract infection) 02/28/2016  . Urinary incontinence 10/28/2015  . Alopecia 08/12/2015  . TIA (transient ischemic attack) 07/01/2015  . Acute encephalopathy 06/24/2015  . Protein-calorie malnutrition, severe (HCC) 04/19/2015  . Fever   . Abdominal abscess   . Pressure ulcer 04/07/2015  . Acute kidney  injury (HCC)   . Sepsis (HCC)   . Stroke (HCC)   . Facial droop   . Confusion 03/26/2015  . Malnutrition of moderate degree (HCC) 03/20/2015  . Small bowel obstruction s/p exlap/LOA/decompression 03/25/2015 03/18/2015  . Essential hypertension 03/18/2015  . Noncompliance with medication regimen 12/07/2014  . Fall against object 06/24/2014  . Contusion of left hand 06/24/2014  . Contusion of left hip 06/24/2014  . Loss of weight 04/12/2014  . Malignant hypertension with renal failure and congestive heart  failure (HCC) 09/23/2013  . Knee pain, bilateral 07/27/2013  . Chronic fatigue disorder 01/19/2013  . Vaginitis due to Candida 09/02/2012  . Sinusitis 09/02/2012  . Anemia in other chronic diseases classified elsewhere 09/02/2012  . Polymyalgia rheumatica (HCC) 09/10/2011  . Vertigo 08/21/2011  . Fatigue 05/11/2011  . ANEMIA OF CHRONIC DISEASE 09/06/2010  . KNEE PAIN, CHRONIC 05/26/2010  . DIZZINESS 05/11/2010  . SHOULDER PAIN 04/25/2010  . FREQUENCY, URINARY 03/16/2010  . CONSTIPATION, CHRONIC 01/17/2010  . FOOT PAIN 01/17/2009  . Rash and other nonspecific skin eruption 01/17/2009  . TINNITUS NOS 10/18/2008  . B12 deficiency 08/10/2008  . LOW BACK PAIN 06/23/2008  . CHEST WALL PAIN 06/23/2008  . Dysuria 03/18/2008  . Abdominal pain 03/18/2008  . Hyperlipidemia 12/24/2007  . DYSPNEA 12/24/2007  . Anxiety disorder 12/07/2007  . GERD 12/07/2007  . CHEST PAIN 12/07/2007  . OTHER SPEC FORMS CHRONIC ISCHEMIC HEART DISEASE 12/01/2007  . Type 2 diabetes mellitus with diabetic nephropathy, without long-term current use of insulin (HCC) 11/28/2007  . Pain in joint 11/28/2007  . Gout 09/17/2007  . Adjustment disorder with mixed anxiety and depressed mood 09/17/2007  . Coronary atherosclerosis 09/17/2007  . Disorder resulting from impaired renal function 09/17/2007  . OSTEOPOROSIS 09/17/2007  . DVT, HX OF 09/17/2007  . PANCREATITIS, HX OF 09/17/2007    Past Surgical History:  Procedure Laterality Date  . ABDOMINAL HYSTERECTOMY    . CARDIAC CATHETERIZATION  2008   L main 20%, LAD stent patent, D1 50%, D2 80% (small), RCA 20%, EF 55-60%  . CHOLECYSTECTOMY    . CORONARY ANGIOPLASTY WITH STENT PLACEMENT  1999   LAD stent  . CORONARY STENT INTERVENTION N/A 10/16/2019   Procedure: CORONARY STENT INTERVENTION;  Surgeon: Marykay Lex, MD;  Location: [facility] INVASIVE CV LAB;  Service: Cardiovascular;  Laterality: N/A;  . HEMORRHOID SURGERY    . LAPAROSCOPIC LYSIS OF ADHESIONS N/A  03/24/2015   Procedure: LAPAROSCOPIC LYSIS OF ADHESIONS;  Surgeon: Luretha Murphy, MD;  Location: [facility] ORS;  Service: General;  Laterality: N/A;  . LAPAROSCOPY N/A 03/24/2015   Procedure: LAPAROSCOPY DIAGNOSTIC;  Surgeon: Luretha Murphy, MD;  Location: [facility] ORS;  Service: General;  Laterality: N/A;  . LAPAROTOMY N/A 03/24/2015   Procedure: LAPAROTOMY with decompression of bowel;  Surgeon: Luretha Murphy, MD;  Location: [facility] ORS;  Service: General;  Laterality: N/A;  . LEFT HEART CATH AND CORONARY ANGIOGRAPHY N/A 10/16/2019   Procedure: LEFT HEART CATH AND CORONARY ANGIOGRAPHY;  Surgeon: Marykay Lex, MD;  Location: [facility] INVASIVE CV LAB;  Service: Cardiovascular;  Laterality: N/A;  . TUBAL LIGATION       OB History   No obstetric history on file.     Family History  Adopted: Yes  Problem Relation Age of Onset  . Diabetes Mother   . Hypertension Father     Social History   Tobacco Use  . Smoking status: Never Smoker  . Smokeless tobacco: Never Used  Substance Use Topics  .  Alcohol use: No  . Drug use: No    Home Medications Prior to Admission medications   Medication Sig Start Date End Date Taking? Authorizing Provider  acetaminophen (TYLENOL) 650 MG CR tablet Take 1,300 mg by mouth every 8 (eight) hours as needed for pain.    [provider]  Alcohol Swabs (B-D SINGLE USE SWABS REGULAR) PADS Use to clean area to check blood sugars twice a day 08/27/17   Plotnikov, [name], MD  amLODipine (NORVASC) 10 MG tablet Take 1 tablet (10 mg total) by mouth daily. 02/10/20   Leroy Sea, MD  aspirin EC 81 MG tablet Take 1 tablet (81 mg total) by mouth daily. Swallow whole. 10/27/20   Rollene Rotunda, MD  bisacodyl (BISACODYL) 5 MG EC tablet Take 5-10 mg by mouth daily as needed for moderate constipation.    [provider]  Blood Glucose Calibration (ACCU-CHEK AVIVA) SOLN Use as directed Patient taking differently: 1 each by Other route See admin instructions. Use as  directed per physician 02/20/17   Plotnikov, [name], MD  brimonidine (ALPHAGAN) 0.2 % ophthalmic solution Place 1 drop into both eyes 2 (two) times daily. 01/14/20   [provider]  carvedilol (COREG) 25 MG tablet Take 1 tablet (25 mg total) by mouth 2 (two) times daily with a meal. 09/26/20   Rollene Rotunda, MD  cholecalciferol (VITAMIN D) 25 MCG (1000 UNIT) tablet Take 1,000 Units by mouth daily.    [provider]  citalopram (CELEXA) 10 MG tablet Take 1 tablet (10 mg total) by mouth daily. 08/29/20 08/29/21  Corwin Levins, MD  Cyanocobalamin (VITAMIN B-12) 3000 MCG SUBL Place 3,000 mcg under the tongue daily.    [provider]  doxycycline (VIBRA-TABS) 100 MG tablet Take 1 tablet (100 mg total) by mouth 2 (two) times daily. 12/09/20   Olive Bass, FNP  feeding supplement, GLUCERNA SHAKE, (GLUCERNA SHAKE) LIQD Take 237 mLs by mouth 3 (three) times daily between meals. 02/09/20   Leroy Sea, MD  glucose blood (ONETOUCH VERIO) test strip Use as instructed Patient taking differently: 1 each by Other route See admin instructions. Use as instructed for blood sugar 08/27/17   Plotnikov, [name], MD  guaiFENesin-dextromethorphan (ROBITUSSIN DM) 100-10 MG/5ML syrup Take 10 mLs by mouth every 4 (four) hours as needed for cough. 02/09/20   Leroy Sea, MD  levETIRAcetam (KEPPRA) 250 MG tablet Take 1 tablet (250 mg total) by mouth 2 (two) times daily. 04/19/20   Plotnikov, [name], MD  levETIRAcetam (KEPPRA) 500 MG tablet Take 500 mg by mouth 2 (two) times daily. 06/07/20   [provider]  meclizine (ANTIVERT) 25 MG tablet TAKE 1/2 TO 1 (ONE-HALF TO ONE) TABLET BY MOUTH THREE TIMES DAILY 09/07/20   Plotnikov, [name], MD  nitroGLYCERIN (NITROSTAT) 0.4 MG SL tablet Place 1 tablet (0.4 mg total) under the tongue every 5 (five) minutes x 3 doses as needed for chest pain. 12/18/19   Berton Bon, NP  ondansetron (ZOFRAN) 4 MG tablet TAKE 1 TABLET BY  MOUTH TWICE DAILY AS NEEDED FOR NAUSEA OR VOMITING 09/07/20   Plotnikov, [name], MD  ONETOUCH DELICA LANCETS FINE MISC 1 Device by Does not apply route daily as needed. Patient taking differently: 1 Device by Does not apply route daily as needed (blood glucose). 09/21/16   Plotnikov, [name], MD  pantoprazole (PROTONIX) 40 MG tablet Take 1 tablet (40 mg total) by mouth daily. 02/09/20   Susa Raring  K, MD  rosuvastatin (CRESTOR) 20 MG tablet Take 1 tablet (20 mg total) by mouth daily. 01/07/20 04/06/20  Rollene Rotunda, MD  silver sulfADIAZINE (SILVADENE) 1 % cream Apply 1 application topically 2 (two) times daily. 12/09/20   Olive Bass, FNP    Allergies    Ativan [lorazepam], Tramadol hcl, Amlodipine besylate, Atenolol, Benazepril, Benicar [olmesartan medoxomil], Cozaar, Hydralazine, Hydrochlorothiazide w-triamterene, Hydrocodone, Hydroxyzine pamoate, Iodine, Lisinopril, Peach flavor, Penicillins, Pravastatin, Prednisolone, Strawberry flavor, Tramadol hcl, and Benadryl [diphenhydramine]  Review of Systems   Review of Systems  Unable to perform ROS: Mental status change  Gastrointestinal: Positive for abdominal pain.  Psychiatric/Behavioral: Positive for confusion.    Physical Exam Updated Vital Signs BP (!) 165/75 (BP Location: Right Arm)   Pulse 80   Temp 99.4 F (37.4 C) (Oral)   Resp 20   Ht 5\' 4"  (1.626 m)   Wt 76.3 kg   SpO2 99%   BMI 28.87 kg/m   Physical Exam Vitals and nursing note reviewed.  Constitutional:      General: She is in acute distress.     Appearance: She is well-developed. She is obese. She is ill-appearing.     Comments: Patient somnolent on exam but opens eyes to her name.  HENT:     Head: Normocephalic and atraumatic.     Right Ear: External ear normal.     Left Ear: External ear normal.     Nose: Nose normal.     Mouth/Throat:     Mouth: Mucous membranes are moist.     Pharynx: Oropharynx is clear. No oropharyngeal exudate or  posterior oropharyngeal erythema.  Eyes:     General: No scleral icterus.       Right eye: No discharge.        Left eye: No discharge.     Extraocular Movements: Extraocular movements intact.     Conjunctiva/sclera: Conjunctivae normal.     Pupils: Pupils are equal, round, and reactive to light.  Cardiovascular:     Rate and Rhythm: Normal rate and regular rhythm.     Pulses: Normal pulses.     Heart sounds: Normal heart sounds.  Pulmonary:     Effort: Pulmonary effort is normal. No respiratory distress.     Breath sounds: Normal breath sounds. No wheezing, rhonchi or rales.  Abdominal:     General: Abdomen is flat. There is no distension.     Palpations: Abdomen is soft.     Tenderness: There is no abdominal tenderness. There is no guarding or rebound. Negative signs include Murphy's sign, Rovsing's sign and McBurney's sign.  Musculoskeletal:        General: No signs of injury.     Right lower leg: No edema.     Left lower leg: No edema.  Skin:    General: Skin is warm and dry.     Findings: No rash.  Neurological:     Mental Status: She is easily aroused. She is lethargic, disoriented and confused.     GCS: GCS eye subscore is 3. GCS verbal subscore is 3. GCS motor subscore is 6.     Cranial Nerves: No facial asymmetry.     Comments: Patient unable to participate with full neuro exam. No obvious neuro deficits. Opens eyes to her name and will follow some commands if repeated multiple times. Moving all extremities.     ED Results / Procedures / Treatments   Labs (all labs ordered are listed, but only abnormal results are  displayed) Labs Reviewed  COMPREHENSIVE METABOLIC PANEL - Abnormal; Notable for the following components:      Result Value   CO2 19 (*)    Glucose, Bld 108 (*)    Albumin 3.4 (*)    GFR, Estimated 55 (*)    All other components within normal limits  CBC WITH DIFFERENTIAL/PLATELET - Abnormal; Notable for the following components:   RBC 2.89 (*)     Hemoglobin 8.9 (*)    HCT 28.8 (*)    nRBC 0.3 (*)    All other components within normal limits  URINALYSIS, COMPLETE (UACMP) WITH MICROSCOPIC - Abnormal; Notable for the following components:   Protein, ur 100 (*)    All other components within normal limits  APTT - Abnormal; Notable for the following components:   aPTT 54 (*)    All other components within normal limits  CBG MONITORING, ED - Abnormal; Notable for the following components:   Glucose-Capillary 102 (*)    All other components within normal limits  TROPONIN I (HIGH SENSITIVITY) - Abnormal; Notable for the following components:   Troponin I (High Sensitivity) 53 (*)    All other components within normal limits  RESP PANEL BY RT-PCR (FLU A&B, COVID) [id number]  URINE CULTURE  CULTURE, BLOOD (ROUTINE X 2)  CULTURE, BLOOD (ROUTINE X 2)  LACTIC ACID, PLASMA  RAPID URINE DRUG SCREEN, HOSP PERFORMED  PROTIME-INR  AMMONIA  URINALYSIS, ROUTINE W REFLEX MICROSCOPIC  BASIC METABOLIC PANEL  CBC  LEVETIRACETAM LEVEL  I-STAT VENOUS BLOOD GAS, ED    EKG EKG Interpretation  Date/Time:  Sunday January 08 2021 18:00:37 EST Ventricular Rate:  65 PR Interval:    QRS Duration: 130 QT Interval:  442 QTC Calculation: 460 R Axis:   62 Text Interpretation: Sinus rhythm Atrial premature complex Right bundle branch block Since last tracing rate slower Confirmed by Eber Hong ([id number]) on 01/08/2021 6:55:47 PM   Radiology CT ABDOMEN PELVIS WO CONTRAST  Result Date: 01/08/2021 CLINICAL DATA:  Acute generalized abdominal pain. EXAM: CT ABDOMEN AND PELVIS WITHOUT CONTRAST TECHNIQUE: Multidetector CT imaging of the abdomen and pelvis was performed following the standard protocol without IV contrast. COMPARISON:  May 10, 2015. FINDINGS: Lower chest: No acute abnormality. Hepatobiliary: No focal liver abnormality is seen. Status post cholecystectomy. No biliary dilatation. Pancreas: Unremarkable. No pancreatic ductal dilatation or  surrounding inflammatory changes. Spleen: Normal in size without focal abnormality. Adrenals/Urinary Tract: Adrenal glands are unremarkable. Kidneys are normal, without renal calculi, focal lesion, or hydronephrosis. Bladder is unremarkable. Stomach/Bowel: Stomach appears normal. There is no evidence of bowel obstruction or inflammation. Vascular/Lymphatic: Aortic atherosclerosis. No enlarged abdominal or pelvic lymph nodes. Reproductive: Status post hysterectomy. No adnexal masses. Other: No abdominal wall hernia or abnormality. No abdominopelvic ascites. Musculoskeletal: No acute or significant osseous findings. IMPRESSION: Aortic atherosclerosis. No acute abnormality seen in the abdomen or pelvis. Aortic Atherosclerosis (ICD10-I70.0). Electronically Signed   By: Lupita Raider M.D.   On: 01/08/2021 21:23   CT HEAD WO CONTRAST  Result Date: 01/08/2021 CLINICAL DATA:  Mental status change.  Unknown. EXAM: CT HEAD WITHOUT CONTRAST TECHNIQUE: Contiguous axial images were obtained from the base of the skull through the vertex without intravenous contrast. COMPARISON:  CT head 02/01/2002, MR head 07/17/2016 FINDINGS: Brain: Cerebral ventricle sizes are concordant with the degree of cerebral volume loss. Patchy and confluent areas of decreased attenuation are noted throughout the deep and periventricular white matter of the cerebral hemispheres bilaterally, compatible with chronic microvascular ischemic disease.  No evidence of large-territorial acute infarction. No parenchymal hemorrhage. No mass lesion. No extra-axial collection. No mass effect or midline shift. No hydrocephalus. Basilar cisterns are patent. No Similar-appearing extended sella that appears partially empty. Vascular: No hyperdense vessel. Atherosclerotic calcifications are present within the cavernous internal carotid arteries. Skull: No acute fracture or focal lesion. Sinuses/Orbits: Paranasal sinuses and mastoid air cells are clear. The orbits  are unremarkable. Other: None. IMPRESSION: No acute intracranial abnormality. Electronically Signed   By: Tish Frederickson M.D.   On: 01/08/2021 19:42   DG Chest Port 1 View  Result Date: 01/08/2021 CLINICAL DATA:  Altered mental status. EXAM: PORTABLE CHEST 1 VIEW COMPARISON:  August 02, 2020. FINDINGS: Stable cardiomegaly. No pneumothorax or pleural effusion is noted. Right lung is clear. Minimal left lingular subsegmental atelectasis or scarring is noted. Bony thorax is unremarkable. IMPRESSION: Minimal left lingular subsegmental atelectasis or scarring. Aortic Atherosclerosis (ICD10-I70.0). Electronically Signed   By: Lupita Raider M.D.   On: 01/08/2021 18:11    Procedures Procedures  Medications Ordered in ED Medications  lactated ringers infusion ( Intravenous New Bag/Given 01/08/21 2138)  cefTRIAXone (ROCEPHIN) 2 g in sodium chloride 0.9 % 100 mL IVPB ( Intravenous Canceled Entry 01/08/21 1932)  azithromycin (ZITHROMAX) 500 mg in sodium chloride 0.9 % 250 mL IVPB (0 mg Intravenous Stopped 01/08/21 2237)  enoxaparin (LOVENOX) injection 40 mg (has no administration in time range)  aspirin EC tablet 81 mg (has no administration in time range)  carvedilol (COREG) tablet 25 mg (has no administration in time range)  amLODipine (NORVASC) tablet 10 mg (has no administration in time range)  citalopram (CELEXA) tablet 10 mg (has no administration in time range)    ED Course  I have reviewed the triage vital signs and the nursing notes.  Pertinent labs & imaging results that were available during my care of the patient were reviewed by me and considered in my medical decision making (see chart for details).    MDM Rules/Calculators/A&P                          Patient is a 87yoF with history and physical as described above who presents to the ED for AMS. VS notable for rectal temp 100.31F, otherwise HDS. ABCs intact. AMS workup initiated as well as septic workup. IVF given and started on  empiric antibiotics. Head CT negative. CXR reassuring. Initial ECG reassuring as well. Repeat rectal temp afebrile.  Overall metabolic workup reassuring and not demonstrating clear etiology for presenting symptoms. CT abdomen pelvis also ordered given reported abdominal pain which was also unremarkable. Etiology for AMS remains unclear at this time. On reassessment, patient still altered and unable to answer questions other than her name. Given rapid mental status decline, patient warrants admission for further workup and management. Discussed patient with hospitalist who will admit. Patient otherwise remained HDS with no further acute events under my care. Patient in stable condition at time of admission.  Final Clinical Impression(s) / ED Diagnoses Final diagnoses:  Altered mental status, unspecified altered mental status type      Tonia Brooms, MD 01/09/21 [id number]    Eber Hong, MD 01/10/21 1032

## 2021-01-08 NOTE — ED Notes (Signed)
Unable to obtain 2nd IV and blood. Notified lab so they could come obtain blood

## 2021-01-08 NOTE — ED Notes (Addendum)
1st set of blood cultures obtained at this time by lab

## 2021-01-08 NOTE — ED Notes (Signed)
Explained CT to family member; ok with imaging. Called CT to get pt in transport.

## 2021-01-08 NOTE — Progress Notes (Signed)
Elink following for sepsis protocol. BC and Abx timing noted

## 2021-01-08 NOTE — ED Notes (Signed)
Patient transported to CT 

## 2021-01-08 NOTE — ED Triage Notes (Signed)
Pt arrived to ED via EMS from home w/ c/o AMS. EMS reports pt lives at home with her blind husband. Family reports pt is A&Ox4 at baseline. [name] family last saw her normal at 1800 yesterday. At 1640 EMS said family reported pt was A&Ox1 (self). EMS reports on scene pt was A&Ox1 and able to follow commands. EMS reports no neuro deficits noted. Upon arrival to ED EMS reports pt became more lethargic and was mumbling. EMS reports the only complaint they got out of the pt en route was back pain and R side pain.   VS: BP 200/106, HR 70, 98% RA, CBG 210 IV 20g L AC

## 2021-01-08 NOTE — Progress Notes (Deleted)
Cardiology Office Note   Date:  01/08/2021   ID:  [name], DOB Dec 27, 1932, MRN [id number]  PCP:  Tresa Garter, MD  Cardiologist:   Rollene Rotunda, MD   No chief complaint on file.     History of Present Illness: [name] is a 87 y.o. female of CAD with cardiac catheterization revealing two-vessel CAD with widely patent previously placed stent to the LAD with culprit lesion felt to be an ulcerated thrombotic irregular proximal to mid RCA lesion.  This was in 2020. She underwent a successful but complex PCI/DES of the mid RCA x1.    Since I last saw her ***  ***  She was placed on dual antiplatelet therapy with aspirin and Plavix for minimum of 1 year.  She had a follow-up echocardiogram which revealed an EF of 60% to 65% with normal LV function and grade 1 diastolic dysfunction with mildly dilated atria.  She was also placed on high-dose statin, atorvastatin 40 mg at at bedtime, continued on her home dose of carvedilol 25 mg twice daily.  She was noted to have some transient confusion during her hospitalization, but had returned to normal at discharge.  At the last appt she was describing palpitations.  She complains today that she does get some increased heart rate when she moves about in particular.  She describes some shortness of breath.  She is very anxious.  She is very fatigued.  She has a husband with dementia and she never sleeps.  She is not really describing any chest pressure, neck or arm discomfort.  She is not having any new PND or orthopnea.  Has had no weight gain or edema.  She gets around slowly in her house.   Past Medical History:  Diagnosis Date  . Anemia    iron deficiency  . Aneurysm, thoracic aortic (HCC)   . Anxiety   . B12 deficiency   . CAD (coronary artery disease)    s/p stenting of LAD 1999- cath 5-08 EF normal LAD 30-40% restenosis. D1 50% D2 80% LCX & RCA minimal plaque  . Chronic back pain   . CKD (chronic kidney disease)  stage 3, GFR 30-59 ml/min (HCC) 08/29/2020  . Constipation   . Depression   . Diabetes mellitus   . DVT (deep venous thrombosis) (HCC)   . GERD (gastroesophageal reflux disease)   . Gout   . HTN (hypertension)   . Hyperlipemia   . Obesity   . Osteoporosis   . Pancreatitis   . Polyarthritis    DJD/ possible PMR  . Renal insufficiency    Cr 1.2-1.3  . Seizures (HCC)   . Tinnitus   . Urinary frequency   . Vertigo   . Vitamin D deficiency     Past Surgical History:  Procedure Laterality Date  . ABDOMINAL HYSTERECTOMY    . CARDIAC CATHETERIZATION  2008   L main 20%, LAD stent patent, D1 50%, D2 80% (small), RCA 20%, EF 55-60%  . CHOLECYSTECTOMY    . CORONARY ANGIOPLASTY WITH STENT PLACEMENT  1999   LAD stent  . CORONARY STENT INTERVENTION N/A 10/16/2019   Procedure: CORONARY STENT INTERVENTION;  Surgeon: Marykay Lex, MD;  Location: [facility] INVASIVE CV LAB;  Service: Cardiovascular;  Laterality: N/A;  . HEMORRHOID SURGERY    . LAPAROSCOPIC LYSIS OF ADHESIONS N/A 03/24/2015   Procedure: LAPAROSCOPIC LYSIS OF ADHESIONS;  Surgeon: Luretha Murphy, MD;  Location: [facility] ORS;  Service: General;  Laterality:  N/A;  . LAPAROSCOPY N/A 03/24/2015   Procedure: LAPAROSCOPY DIAGNOSTIC;  Surgeon: Luretha Murphy, MD;  Location: [facility];  Service: General;  Laterality: N/A;  . LAPAROTOMY N/A 03/24/2015   Procedure: LAPAROTOMY with decompression of bowel;  Surgeon: Luretha Murphy, MD;  Location: [facility];  Service: General;  Laterality: N/A;  . LEFT HEART CATH AND CORONARY ANGIOGRAPHY N/A 10/16/2019   Procedure: LEFT HEART CATH AND CORONARY ANGIOGRAPHY;  Surgeon: Marykay Lex, MD;  Location: [facility] INVASIVE CV LAB;  Service: Cardiovascular;  Laterality: N/A;  . TUBAL LIGATION       Current Outpatient Medications  Medication Sig Dispense Refill  . acetaminophen (TYLENOL) 650 MG CR tablet Take 1,300 mg by mouth every 8 (eight) hours as needed for pain.    . Alcohol Swabs (B-D SINGLE USE SWABS REGULAR)  PADS Use to clean area to check blood sugars twice a day 300 each 1  . amLODipine (NORVASC) 10 MG tablet Take 1 tablet (10 mg total) by mouth daily.    [name] aspirin EC 81 MG tablet Take 1 tablet (81 mg total) by mouth daily. Swallow whole. 90 tablet 3  . bisacodyl (BISACODYL) 5 MG EC tablet Take 5-10 mg by mouth daily as needed for moderate constipation.    . Blood Glucose Calibration (ACCU-CHEK AVIVA) SOLN Use as directed (Patient taking differently: 1 each by Other route See admin instructions. Use as directed per physician) 3 each 1  . brimonidine (ALPHAGAN) 0.2 % ophthalmic solution Place 1 drop into both eyes 2 (two) times daily.    . carvedilol (COREG) 25 MG tablet Take 1 tablet (25 mg total) by mouth 2 (two) times daily with a meal. 60 tablet 0  . cholecalciferol (VITAMIN D) 25 MCG (1000 UNIT) tablet Take 1,000 Units by mouth daily.    . citalopram (CELEXA) 10 MG tablet Take 1 tablet (10 mg total) by mouth daily. 90 tablet 3  . Cyanocobalamin (VITAMIN B-12) 3000 MCG SUBL Place 3,000 mcg under the tongue daily.    [name] doxycycline (VIBRA-TABS) 100 MG tablet Take 1 tablet (100 mg total) by mouth 2 (two) times daily. 20 tablet 0  . feeding supplement, GLUCERNA SHAKE, (GLUCERNA SHAKE) LIQD Take 237 mLs by mouth 3 (three) times daily between meals.  0  . glucose blood (ONETOUCH VERIO) test strip Use as instructed (Patient taking differently: 1 each by Other route See admin instructions. Use as instructed for blood sugar) 300 each 3  . guaiFENesin-dextromethorphan (ROBITUSSIN DM) 100-10 MG/5ML syrup Take 10 mLs by mouth every 4 (four) hours as needed for cough. 118 mL 0  . levETIRAcetam (KEPPRA) 250 MG tablet Take 1 tablet (250 mg total) by mouth 2 (two) times daily. 180 tablet 1  . levETIRAcetam (KEPPRA) 500 MG tablet Take 500 mg by mouth 2 (two) times daily.    . meclizine (ANTIVERT) 25 MG tablet TAKE 1/2 TO 1 (ONE-HALF TO ONE) TABLET BY MOUTH THREE TIMES DAILY 45 tablet 0  . nitroGLYCERIN (NITROSTAT)  0.4 MG SL tablet Place 1 tablet (0.4 mg total) under the tongue every 5 (five) minutes x 3 doses as needed for chest pain. 25 tablet 3  . ondansetron (ZOFRAN) 4 MG tablet TAKE 1 TABLET BY MOUTH TWICE DAILY AS NEEDED FOR NAUSEA OR VOMITING 30 tablet 0  . ONETOUCH DELICA LANCETS FINE MISC 1 Device by Does not apply route daily as needed. (Patient taking differently: 1 Device by Does not apply route daily as needed (blood glucose).) 100 each 3  .  pantoprazole (PROTONIX) 40 MG tablet Take 1 tablet (40 mg total) by mouth daily. 30 tablet 0  . rosuvastatin (CRESTOR) 20 MG tablet Take 1 tablet (20 mg total) by mouth daily. 90 tablet 3  . silver sulfADIAZINE (SILVADENE) 1 % cream Apply 1 application topically 2 (two) times daily. 50 g 0   No current facility-administered medications for this visit.    Allergies:   Ativan [lorazepam], Tramadol hcl, Amlodipine besylate, Atenolol, Benazepril, Benicar [olmesartan medoxomil], Cozaar, Hydralazine, Hydrochlorothiazide w-triamterene, Hydrocodone, Hydroxyzine pamoate, Iodine, Lisinopril, Peach flavor, Penicillins, Pravastatin, Prednisolone, Strawberry flavor, Tramadol hcl, and Benadryl [diphenhydramine]    ROS:  Please see the history of present illness.   Otherwise, review of systems are positive for ***.   All other systems are reviewed and negative.    PHYSICAL EXAM: VS:  There were no vitals taken for this visit. , BMI There is no height or weight on file to calculate BMI. GEN:  No distress NECK:  No jugular venous distention at 90 degrees, waveform within normal limits, carotid upstroke brisk and symmetric, no bruits, no thyromegaly LYMPHATICS:  No cervical adenopathy LUNGS:  Clear to auscultation bilaterally BACK:  No CVA tenderness CHEST:  Unremarkable HEART:  S1 and S2 within normal limits, no S3, no S4, no clicks, no rubs, no murmurs ABD:  Positive bowel sounds normal in frequency in pitch, no bruits, no rebound, no guarding, unable to assess  midline mass or bruit with the patient seated. EXT:  2 plus pulses throughout, trace edema, no cyanosis no clubbing SKIN:  No rashes no nodules NEURO:  Cranial nerves II through XII grossly intact, motor grossly intact throughout PSYCH:  Cognitively intact, oriented to person place and time   EKG:  EKG is *** ordered today. The ekg ordered today demonstrates sinus rhythm, rate ***, right bundle, no acute ST-T wave changes.   Recent Labs: 02/10/2020: B Natriuretic Peptide 304.9; Magnesium 1.9 04/19/2020: ALT 8; TSH 1.66 08/02/2020: BUN 19; Creatinine, Ser 1.06; Hemoglobin 9.4; Platelets 147; Potassium 4.5; Sodium 139    Lipid Panel    Component Value Date/Time   CHOL 169 06/25/2015 0410   TRIG 61 06/25/2015 0410   HDL 57 06/25/2015 0410   CHOLHDL 3.0 06/25/2015 0410   VLDL 12 06/25/2015 0410   LDLCALC 100 (H) 06/25/2015 0410   LDLDIRECT 114.8 01/10/2011 0923      Wt Readings from Last 3 Encounters:  04/19/20 200 lb (90.7 kg)  02/07/20 167 lb 12.3 oz (76.1 kg)  01/29/20 178 lb 6.4 oz (80.9 kg)    Diagnostic Dominance: Right    Intervention       Other studies Reviewed: Additional studies/ records that were reviewed today include: Labs. Review of the above records demonstrates:  Please see elsewhere in the note.     ASSESSMENT AND PLAN:  CAD:  ***   At this point I do not think there is an anginal equivalent.  She can stop her Plavix.  I reviewed her images with her.  She can go down to 81 mg aspirin.     Hyperlipidemia:   ***  I will bring her back for fasting lipid profile.   DM:  A1c was *** 7.1.  She was taken off of her meds by Plotnikov, [name], MD because she was not compliant with meds and because she had well controlled sugars.  ***  I sent a message to her primary care to see if she should be back on Metformin or other.  Apparently  this was discontinued I think because her hemoglobin A1c was low.  Fatigue:  ***  She does not get to sleep.  I think  this is the biggest issue.  She clearly has stress.  Hypertension:   *** Controlled.  She will continue the meds as listed.   Palpitations:   ***  I am going to apply a 2-week monitor.   SOB:   ***  I have asked him to get a pulse oximeter.  I will check a BNP level.  I do not think this is heart failure or angina however.   Current medicines are reviewed at length with the patient today.  The patient does not have concerns regarding medicines.  The following changes have been made:  ***  Labs/ tests ordered today include: ***  No orders of the defined types were placed in this encounter.    Disposition:   FU with ***    Signed, Rollene Rotunda, MD  01/08/2021 12:56 PM    [facility]

## 2021-01-08 NOTE — ED Provider Notes (Signed)
I saw and evaluated the patient, reviewed the resident's note and I agree with the findings and plan.  Pertinent History: This patient is an 87 year old female, presents with altered mental status, it is unclear exactly how long this has been going on and the family is not present at the bedside.  The patient is not able to give me any valuable information regarding what happened, she is able to count my fingers and help to sit up in the bed.  She has a slight lid lag on the left lid consistent with mild ptosis possibly however she is able to use all of her cranial nerves III through XII otherwise.  Her speech is extremely slowed, she appears very confused, she does not know where she is, she cannot tell me her birthday, she is able to tell me her name.  She is able to use both of her legs, she is generally weak.  The patient has some difficulty lifting her left arm but is able to hold it up against resistance.  Abdomen is soft and nontender on my exam, cardiac exam shows no murmur or tachycardia  Pt has a rectal temp of 100.5 or more - she has hypertension - 200/106, code sepsis activated, antibiotics ordered  I was personally present and directly supervised the following procedures:  Medical evaluation and stabilization  I personally interpreted the EKG as well as the resident and agree with the interpretation on the resident's chart.  Final diagnoses:  Altered mental status, unspecified altered mental status type      Eber Hong, MD 01/10/21 1032

## 2021-01-08 NOTE — ED Notes (Signed)
Attempted report x1. 
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
## 2021-01-09 ENCOUNTER — Observation Stay (HOSPITAL_COMMUNITY): Payer: Medicare HMO

## 2021-01-09 ENCOUNTER — Telehealth: Payer: Self-pay | Admitting: Internal Medicine

## 2021-01-09 DIAGNOSIS — R4182 Altered mental status, unspecified: Secondary | ICD-10-CM | POA: Diagnosis not present

## 2021-01-09 DIAGNOSIS — Z87898 Personal history of other specified conditions: Secondary | ICD-10-CM | POA: Diagnosis not present

## 2021-01-09 DIAGNOSIS — Z955 Presence of coronary angioplasty implant and graft: Secondary | ICD-10-CM | POA: Diagnosis not present

## 2021-01-09 DIAGNOSIS — I2510 Atherosclerotic heart disease of native coronary artery without angina pectoris: Secondary | ICD-10-CM | POA: Diagnosis not present

## 2021-01-09 DIAGNOSIS — I6782 Cerebral ischemia: Secondary | ICD-10-CM | POA: Diagnosis not present

## 2021-01-09 DIAGNOSIS — I10 Essential (primary) hypertension: Secondary | ICD-10-CM | POA: Diagnosis not present

## 2021-01-09 LAB — BASIC METABOLIC PANEL
Anion gap: 12 (ref 5–15)
BUN: 17 mg/dL (ref 8–23)
CO2: 19 mmol/L — ABNORMAL LOW (ref 22–32)
Calcium: 9.1 mg/dL (ref 8.9–10.3)
Chloride: 108 mmol/L (ref 98–111)
Creatinine, Ser: 0.94 mg/dL (ref 0.44–1.00)
GFR, Estimated: 59 mL/min — ABNORMAL LOW (ref >60)
Glucose, Bld: 81 mg/dL (ref 70–99)
Potassium: 4.2 mmol/L (ref 3.5–5.1)
Sodium: 139 mmol/L (ref 135–145)

## 2021-01-09 LAB — CBC
HCT: 26.8 % — ABNORMAL LOW (ref 36.0–46.0)
Hemoglobin: 8.7 g/dL — ABNORMAL LOW (ref 12.0–15.0)
MCH: 31.9 pg (ref 26.0–34.0)
MCHC: 32.5 g/dL (ref 30.0–36.0)
MCV: 98.2 fL (ref 80.0–100.0)
Platelets: 157 10*3/uL (ref 150–400)
RBC: 2.73 MIL/uL — ABNORMAL LOW (ref 3.87–5.11)
RDW: 12.4 % (ref 11.5–15.5)
WBC: 6.4 10*3/uL (ref 4.0–10.5)
nRBC: 0 % (ref 0.0–0.2)

## 2021-01-09 MED ORDER — HYDROMORPHONE HCL 1 MG/ML IJ SOLN
0.5000 mg | INTRAMUSCULAR | Status: DC | PRN
  Administered 2021-01-09 – 2021-01-14 (×2): 0.5 mg via INTRAVENOUS
  Filled 2021-01-09 (×2): qty 0.5

## 2021-01-09 MED ORDER — ACETAMINOPHEN 325 MG PO TABS
650.0000 mg | ORAL_TABLET | Freq: Four times a day (QID) | ORAL | Status: DC | PRN
  Administered 2021-01-10 – 2021-01-14 (×5): 650 mg via ORAL
  Filled 2021-01-09 (×4): qty 2

## 2021-01-09 MED ORDER — GADOBUTROL 1 MMOL/ML IV SOLN
7.5000 mL | Freq: Once | INTRAVENOUS | Status: AC | PRN
  Administered 2021-01-09: 7.5 mL via INTRAVENOUS

## 2021-01-09 NOTE — Procedures (Signed)
Patient Name: [name]  MRN: [id number]  Epilepsy Attending: Charlsie Quest  Referring Physician/Provider: Dr [name] Gutter Date: 01/09/2021  Duration: 33.25 mins  Patient history: 87 year old female with history of seizures presented with altered mental status.  EEG to evaluate for seizures.  Level of alertness: Awake, asleep  AEDs during EEG study: Keppra  Technical aspects: This EEG study was done with scalp electrodes positioned according to the 10-20 International system of electrode placement. Electrical activity was acquired at a sampling rate of 500Hz  and reviewed with a high frequency filter of 70Hz  and a low frequency filter of 1Hz . EEG data were recorded continuously and digitally stored.   Description: The posterior dominant rhythm consists of 9 Hz activity of moderate voltage (25-35 uV) seen predominantly in posterior head regions, symmetric and reactive to eye opening and eye closing. Sleep was characterized by vertex waves, sleep spindles (12 to 14 Hz), maximal frontocentral region.  EEG showed intermittent generalized 3 to 6 Hz theta-delta slowing. Physiologic photic driving was not seen during photic stimulation.  Hyperventilation was not performed.     ABNORMALITY -Intermittent slow, generalized  IMPRESSION: This study is suggestive of mild diffuse encephalopathy, nonspecific etiology. No seizures or epileptiform discharges were seen throughout the recording.  [name] 

## 2021-01-09 NOTE — Progress Notes (Signed)
Patient went down for MRI.

## 2021-01-09 NOTE — Progress Notes (Signed)
EEG in process, result pending.. 

## 2021-01-09 NOTE — Telephone Encounter (Signed)
° °  Daughter [name] requesting refill for levETIRAcetam (KEPPRA) 500 MG tablet and carvedilol (COREG) tablet 25 mg  Pharmacy [facility] [address] [place], [place] - [address]
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
## 2021-01-09 NOTE — Progress Notes (Signed)
Occupational Therapy Evaluation Patient Details Name: [name] MRN: [id number] DOB: [birth year] Today's Date: 01/09/2021    History of Present Illness [name] is a 87 y.o. female PMHx:  for CAD s/p stent x2, chronic diastolic heart failure, hypertension, TIA, type 2 diabetes, history of seizure, CKD stage III, PMR, depression who presents with concerns of altered mental status. EEG negative for seizures and MRI brain negative for acute abnormality.   Clinical Impression   Pt PTA: pt living with blind spouse and son who recently has been hospitalized with PNA. Pt was independent with ADL, performing own medication management (Some of her medications are sent via mail and prepackaged-others her daughter picks up) and walking up and down stairs to bedroom on 2nd floor. Pt using rollator for mobility. Pt currently, limited by decreased strength, decreased activity tolerance and ecreased ability to properly care for self. Pt minA for bed mobility and minA for transfers with RW from bed to recliner. Pt modA overall for ADL and requires increased time for all tasks. Pt would benefit from continued OT skilled services for ADL, mobility and safety in HHOT setting. OT following acutely.    Follow Up Recommendations  Home health OT;Supervision/Assistance - 24 hour    Equipment Recommendations  3 in 1 bedside commode    Recommendations for Other Services       Precautions / Restrictions Precautions Precautions: Fall Restrictions Weight Bearing Restrictions: No      Mobility Bed Mobility Overal bed mobility: Needs Assistance Bed Mobility: Rolling;Supine to Sit Rolling: Min assist   Supine to sit: Mod assist;HOB elevated     General bed mobility comments: assist for BLE maneuvering and trunk elevation    Transfers Overall transfer level: Needs assistance Equipment used: Rolling walker (2 wheeled) Transfers: Sit to/from [employer] Sit to Stand: Min  assist Stand pivot transfers: Min assist;From elevated surface       General transfer comment: Bed slightly elevated and momentum used for sit to stand    Balance Overall balance assessment: Needs assistance Sitting-balance support: Bilateral upper extremity supported Sitting balance-Leahy Scale: Fair     Standing balance support: Bilateral upper extremity supported Standing balance-Leahy Scale: Poor Standing balance comment: reliant on RW                           ADL either performed or assessed with clinical judgement   ADL Overall ADL's : Needs assistance/impaired Eating/Feeding: Set up;Sitting   Grooming: Set up;Sitting   Upper Body Bathing: Set up;Sitting   Lower Body Bathing: Moderate assistance;Sitting/lateral leans;Sit to/from stand   Upper Body Dressing : Set up;Sitting   Lower Body Dressing: Moderate assistance;Cueing for safety;Sitting/lateral leans;Sit to/from stand   Toilet Transfer: Minimal assistance;Stand-pivot;RW Statistician Details (indicate cue type and reason): simualated to recliner Toileting- Clothing Manipulation and Hygiene: Moderate assistance;Cueing for safety;Sitting/lateral lean;Sit to/from stand       Functional mobility during ADLs: Minimal assistance;Rolling walker General ADL Comments: Pt limited by decreased strength, decreased activity tolerance and ecreased ability to properly care for self. Pt reports that she manages her medications using a pillbox. Some of her medications are sent via mail and prepackaged-others her daughter picks up.     Vision Baseline Vision/History: No visual deficits Patient Visual Report: No change from baseline Vision Assessment?: No apparent visual deficits     Perception     Praxis      Pertinent Vitals/Pain Pain Assessment: Faces Faces Pain  Scale: Hurts a little bit Pain Location: IV site hurts Pain Descriptors / Indicators: Sore Pain Intervention(s): Monitored during session      Hand Dominance Right   Extremity/Trunk Assessment Upper Extremity Assessment Upper Extremity Assessment: Generalized weakness   Lower Extremity Assessment Lower Extremity Assessment: Generalized weakness   Cervical / Trunk Assessment Cervical / Trunk Assessment: Kyphotic   Communication Communication Communication: HOH   Cognition Arousal/Alertness: Awake/alert Behavior During Therapy: WFL for tasks assessed/performed Overall Cognitive Status: Within Functional Limits for tasks assessed                                 General Comments: Pt states that she manages her own medications   General Comments  VSS. no family in room. Son that stays with her has PNA in hospital in [place] and pt's spouse is blind. Pt has a few daughters in the area "to assist."    Exercises     Shoulder Instructions      Home Living Family/patient expects to be discharged to:: Private residence Living Arrangements: Spouse/significant other;Children Available Help at Discharge: Family;Available PRN/intermittently Type of Home: House Home Access: Stairs to enter [employer] of Steps: 3 Entrance Stairs-Rails: Can reach both Home Layout: Bed/bath upstairs;1/2 bath on main level;Two level Alternate Level Stairs-Number of Steps: 12 Alternate Level Stairs-Rails: Can reach both Bathroom Shower/Tub: Tub/shower unit;Walk-in shower   Bathroom Toilet: Handicapped height Bathroom Accessibility: Yes   Home Equipment: Walker - 4 wheels;Shower seat;Grab bars - tub/shower;Hand held shower head   Additional Comments: son- works, but pt states "I don't know about his work."      Prior Functioning/Environment Level of Independence: Independent with assistive device(s)        Comments: uses Rollator for limited community distances, doesn't drive any more        OT Problem List: Decreased strength;Decreased activity tolerance;Impaired balance (sitting and/or standing);Decreased  safety awareness;Impaired vision/perception      OT Treatment/Interventions: Self-care/ADL training;Therapeutic exercise;Energy conservation;Therapeutic activities;Patient/family education;Balance training;Visual/perceptual remediation/compensation    OT Goals(Current goals can be found in the care plan section) Acute Rehab OT Goals Patient Stated Goal: to go home OT Goal Formulation: With patient Time For Goal Achievement: 01/23/21 Potential to Achieve Goals: Good ADL Goals Pt Will Perform Grooming: with set-up;standing Pt Will Transfer to Toilet: with supervision;ambulating;bedside commode Pt/caregiver will Perform Home Exercise Program: Increased strength;Both right and left upper extremity;With written HEP provided;With theraband Additional ADL Goal #1: Pt will increase to minguardA for bed mobility as precursor for ADL Additional ADL Goal #2: Pt will tolerate x3 mins of OOB ADL tasks with 1 seated rest break to increase independence.  OT Frequency: Min 2X/week   Barriers to D/C: Decreased caregiver support  with current home set-up       Co-evaluation              AM-PAC OT "6 Clicks" Daily Activity     Outcome Measure Help from another person eating meals?: A Little Help from another person taking care of personal grooming?: A Little Help from another person toileting, which includes using toliet, bedpan, or urinal?: A Lot Help from another person bathing (including washing, rinsing, drying)?: A Lot Help from another person to put on and taking off regular upper body clothing?: A Little Help from another person to put on and taking off regular lower body clothing?: A Lot 6 Click Score: 15   End of Session Equipment Utilized  During Treatment: Gait belt;Rolling walker Nurse Communication: Mobility status  Activity Tolerance: Patient tolerated treatment well Patient left: in chair;with call bell/phone within reach;with chair alarm set  OT Visit Diagnosis: Unsteadiness  on feet (R26.81);Muscle weakness (generalized) (M62.81)                Time: 1500-1520 OT Time Calculation (min): 20 min Charges:  OT General Charges $OT Visit: 1 Visit OT Evaluation $OT Eval Moderate Complexity: 1 Mod  Flora Lipps, OTR/L Acute Rehabilitation Services Pager: [phone] Office: [phone]   Carols Clemence C 01/09/2021, 5:14 PM

## 2021-01-09 NOTE — Plan of Care (Signed)

## 2021-01-10 ENCOUNTER — Ambulatory Visit: Payer: Medicare HMO | Admitting: Cardiology

## 2021-01-10 DIAGNOSIS — G40901 Epilepsy, unspecified, not intractable, with status epilepticus: Secondary | ICD-10-CM | POA: Diagnosis present

## 2021-01-10 DIAGNOSIS — Z955 Presence of coronary angioplasty implant and graft: Secondary | ICD-10-CM | POA: Diagnosis not present

## 2021-01-10 DIAGNOSIS — Z90710 Acquired absence of both cervix and uterus: Secondary | ICD-10-CM | POA: Diagnosis not present

## 2021-01-10 DIAGNOSIS — E118 Type 2 diabetes mellitus with unspecified complications: Secondary | ICD-10-CM

## 2021-01-10 DIAGNOSIS — N1831 Chronic kidney disease, stage 3a: Secondary | ICD-10-CM | POA: Diagnosis present

## 2021-01-10 DIAGNOSIS — Z9049 Acquired absence of other specified parts of digestive tract: Secondary | ICD-10-CM | POA: Diagnosis not present

## 2021-01-10 DIAGNOSIS — I2510 Atherosclerotic heart disease of native coronary artery without angina pectoris: Secondary | ICD-10-CM

## 2021-01-10 DIAGNOSIS — R8271 Bacteriuria: Secondary | ICD-10-CM | POA: Diagnosis present

## 2021-01-10 DIAGNOSIS — R002 Palpitations: Secondary | ICD-10-CM

## 2021-01-10 DIAGNOSIS — R4182 Altered mental status, unspecified: Secondary | ICD-10-CM | POA: Diagnosis present

## 2021-01-10 DIAGNOSIS — I10 Essential (primary) hypertension: Secondary | ICD-10-CM | POA: Diagnosis not present

## 2021-01-10 DIAGNOSIS — G8929 Other chronic pain: Secondary | ICD-10-CM | POA: Diagnosis present

## 2021-01-10 DIAGNOSIS — E1122 Type 2 diabetes mellitus with diabetic chronic kidney disease: Secondary | ICD-10-CM | POA: Diagnosis present

## 2021-01-10 DIAGNOSIS — Z87898 Personal history of other specified conditions: Secondary | ICD-10-CM | POA: Diagnosis not present

## 2021-01-10 DIAGNOSIS — I130 Hypertensive heart and chronic kidney disease with heart failure and stage 1 through stage 4 chronic kidney disease, or unspecified chronic kidney disease: Secondary | ICD-10-CM | POA: Diagnosis present

## 2021-01-10 DIAGNOSIS — T426X6A Underdosing of other antiepileptic and sedative-hypnotic drugs, initial encounter: Secondary | ICD-10-CM | POA: Diagnosis present

## 2021-01-10 DIAGNOSIS — K219 Gastro-esophageal reflux disease without esophagitis: Secondary | ICD-10-CM | POA: Diagnosis present

## 2021-01-10 DIAGNOSIS — M810 Age-related osteoporosis without current pathological fracture: Secondary | ICD-10-CM | POA: Diagnosis present

## 2021-01-10 DIAGNOSIS — Y92009 Unspecified place in unspecified non-institutional (private) residence as the place of occurrence of the external cause: Secondary | ICD-10-CM | POA: Diagnosis not present

## 2021-01-10 DIAGNOSIS — E1121 Type 2 diabetes mellitus with diabetic nephropathy: Secondary | ICD-10-CM | POA: Diagnosis not present

## 2021-01-10 DIAGNOSIS — J690 Pneumonitis due to inhalation of food and vomit: Secondary | ICD-10-CM | POA: Diagnosis present

## 2021-01-10 DIAGNOSIS — Z885 Allergy status to narcotic agent status: Secondary | ICD-10-CM | POA: Diagnosis not present

## 2021-01-10 DIAGNOSIS — E785 Hyperlipidemia, unspecified: Secondary | ICD-10-CM

## 2021-01-10 DIAGNOSIS — Z86718 Personal history of other venous thrombosis and embolism: Secondary | ICD-10-CM | POA: Diagnosis not present

## 2021-01-10 DIAGNOSIS — B9620 Unspecified Escherichia coli [E. coli] as the cause of diseases classified elsewhere: Secondary | ICD-10-CM | POA: Diagnosis present

## 2021-01-10 DIAGNOSIS — X088XXD Exposure to other specified smoke, fire and flames, subsequent encounter: Secondary | ICD-10-CM | POA: Diagnosis present

## 2021-01-10 DIAGNOSIS — I5032 Chronic diastolic (congestive) heart failure: Secondary | ICD-10-CM | POA: Diagnosis present

## 2021-01-10 DIAGNOSIS — G9341 Metabolic encephalopathy: Secondary | ICD-10-CM | POA: Diagnosis present

## 2021-01-10 DIAGNOSIS — Z91128 Patient's intentional underdosing of medication regimen for other reason: Secondary | ICD-10-CM | POA: Diagnosis not present

## 2021-01-10 DIAGNOSIS — Z20822 Contact with and (suspected) exposure to covid-19: Secondary | ICD-10-CM | POA: Diagnosis present

## 2021-01-10 DIAGNOSIS — F32A Depression, unspecified: Secondary | ICD-10-CM | POA: Diagnosis present

## 2021-01-10 DIAGNOSIS — Z833 Family history of diabetes mellitus: Secondary | ICD-10-CM | POA: Diagnosis not present

## 2021-01-10 DIAGNOSIS — R0602 Shortness of breath: Secondary | ICD-10-CM

## 2021-01-10 DIAGNOSIS — M069 Rheumatoid arthritis, unspecified: Secondary | ICD-10-CM | POA: Diagnosis present

## 2021-01-10 LAB — COMPREHENSIVE METABOLIC PANEL
ALT: 11 U/L (ref 0–44)
AST: 27 U/L (ref 15–41)
Albumin: 3 g/dL — ABNORMAL LOW (ref 3.5–5.0)
Alkaline Phosphatase: 48 U/L (ref 38–126)
Anion gap: 11 (ref 5–15)
BUN: 9 mg/dL (ref 8–23)
CO2: 21 mmol/L — ABNORMAL LOW (ref 22–32)
Calcium: 8.9 mg/dL (ref 8.9–10.3)
Chloride: 106 mmol/L (ref 98–111)
Creatinine, Ser: 0.86 mg/dL (ref 0.44–1.00)
GFR, Estimated: >60 mL/min (ref >60)
Glucose, Bld: 69 mg/dL — ABNORMAL LOW (ref 70–99)
Potassium: 3.2 mmol/L — ABNORMAL LOW (ref 3.5–5.1)
Sodium: 138 mmol/L (ref 135–145)
Total Bilirubin: 1.4 mg/dL — ABNORMAL HIGH (ref 0.3–1.2)
Total Protein: 6.1 g/dL — ABNORMAL LOW (ref 6.5–8.1)

## 2021-01-10 LAB — URINE CULTURE: Culture: 50000 — AB

## 2021-01-10 LAB — CBC
HCT: 25.8 % — ABNORMAL LOW (ref 36.0–46.0)
Hemoglobin: 8.4 g/dL — ABNORMAL LOW (ref 12.0–15.0)
MCH: 31.3 pg (ref 26.0–34.0)
MCHC: 32.6 g/dL (ref 30.0–36.0)
MCV: 96.3 fL (ref 80.0–100.0)
Platelets: 151 10*3/uL (ref 150–400)
RBC: 2.68 MIL/uL — ABNORMAL LOW (ref 3.87–5.11)
RDW: 12.3 % (ref 11.5–15.5)
WBC: 6 10*3/uL (ref 4.0–10.5)
nRBC: 0 % (ref 0.0–0.2)

## 2021-01-10 MED ORDER — AZITHROMYCIN 500 MG PO TABS
500.0000 mg | ORAL_TABLET | Freq: Every day | ORAL | Status: DC
  Administered 2021-01-10: 500 mg via ORAL
  Filled 2021-01-10 (×2): qty 1

## 2021-01-10 MED ORDER — POTASSIUM CHLORIDE CRYS ER 20 MEQ PO TBCR
20.0000 meq | EXTENDED_RELEASE_TABLET | ORAL | Status: AC
  Administered 2021-01-10 (×2): 20 meq via ORAL
  Filled 2021-01-10 (×2): qty 1
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
## 2021-01-10 NOTE — Progress Notes (Signed)
PHARMACIST - PHYSICIAN COMMUNICATION DR:   [place] CONCERNING: Antibiotic IV to Oral Route Change Policy  RECOMMENDATION: This patient is receiving azithromycin by the intravenous route.  Based on criteria approved by the Pharmacy and Therapeutics Committee, the antibiotic(s) is/are being converted to the equivalent oral dose form(s).   DESCRIPTION: These criteria include: Patient being treated for a respiratory tract infection, urinary tract infection, cellulitis or clostridium difficile associated diarrhea if on metronidazole The patient is not neutropenic and does not exhibit a GI malabsorption state The patient is eating (either orally or via tube) and/or has been taking other orally administered medications for a least 24 hours The patient is improving clinically and has a Tmax < 100.5  If you have questions about this conversion, please contact the Pharmacy Department  []  ( [phone] )  [place] []  ( [phone] )  [facility] [x]  ( [phone]  [place] []  ( [phone]  [facility] []  ( [phone] )  [facility]   

## 2021-01-10 NOTE — Plan of Care (Signed)
Pt has not been to sleep all night. She was confused and thinking she was not in the right room. Pt stated her room was across the hall and she didn't like the room she was on. No other concerns. Problem: Education: Goal: Knowledge of General Education information will improve Description: Including pain rating scale, medication(s)/side effects and non-pharmacologic comfort measures Outcome: Progressing   Problem: Health Behavior/Discharge Planning: Goal: Ability to manage health-related needs will improve Outcome: Progressing   Problem: Clinical Measurements: Goal: Ability to maintain clinical measurements within normal limits will improve Outcome: Progressing Goal: Will remain free from infection Outcome: Progressing Goal: Diagnostic test results will improve Outcome: Progressing Goal: Respiratory complications will improve Outcome: Progressing Goal: Cardiovascular complication will be avoided Outcome: Progressing   Problem: Activity: Goal: Risk for activity intolerance will decrease Outcome: Progressing   Problem: Nutrition: Goal: Adequate nutrition will be maintained Outcome: Progressing   Problem: Coping: Goal: Level of anxiety will decrease Outcome: Progressing   Problem: Elimination: Goal: Will not experience complications related to bowel motility Outcome: Progressing Goal: Will not experience complications related to urinary retention Outcome: Progressing   Problem: Pain Managment: Goal: General experience of comfort will improve Outcome: Progressing   Problem: Safety: Goal: Ability to remain free from injury will improve Outcome: Progressing   Problem: Skin Integrity: Goal: Risk for impaired skin integrity will decrease Outcome: Progressing

## 2021-01-10 NOTE — Care Management Obs Status (Signed)
MEDICARE OBSERVATION STATUS NOTIFICATION   Patient Details  Name: [name] MRN: [id number] Date of Birth: 11-Sep-1933   Medicare Observation Status Notification Given:  Yes    Baldemar Lenis, LCSW 01/10/2021, 10:16 AM

## 2021-01-10 NOTE — TOC Initial Note (Signed)
Transition of Care [facility]) - Initial/Assessment Note    Patient Details  Name: [name] MRN: [id number] Date of Birth: Jul 29, 1933  Transition of Care [facility]) CM/SW Contact:    Geralynn Ochs, LCSW Phone Number: 01/10/2021, 1:04 PM  Clinical Narrative:     CSW met with patient's POA Linda at the bedside, in addition to sister Doroteo Bradford, to discuss plans for discharge. Family hopeful for SNF placement, as the patient is not at her baseline at this time. CSW notified PT to evaluate for SNF appropriateness during session. Patient has been to [facility] before, but the family would prefer [place] as that is closer to where family can get to her easily. Now that PT has recommended SNF, CSW to fax out referral and follow up with [facility] on bed availability.   Family also asked about caregivers when the patient is ready to go home, as they will likely need support. CSW discussed with them looking into [employer] or [employer] for caregiver support, as that is not covered by insurance. Family noted understanding. CSW to follow.              Expected Discharge Plan: Skilled Nursing Facility Barriers to Discharge: Continued Medical Work up,Insurance Authorization   Patient Goals and CMS Choice Patient states their goals for this hospitalization and ongoing recovery are:: patient unable to participate in goal setting, only oriented to self CMS Medicare.gov Compare Post Acute Care list provided to:: Patient Represenative (must comment) Choice offered to / list presented to : [place] / Guardian  Expected Discharge Plan and Services Expected Discharge Plan: [place] Choice: [place] arrangements for the past 2 months: Single Family Home                                      Prior Living Arrangements/Services Living arrangements for the past 2 months: Single Family Home Lives with:: Spouse Patient language and need for interpreter  reviewed:: No Do you feel safe going back to the place where you live?: Yes      Need for Family Participation in Patient Care: Yes (Comment) Care giver support system in place?: No (comment)   Criminal Activity/Legal Involvement Pertinent to Current Situation/Hospitalization: No - Comment as needed  Activities of Daily Living      Permission Sought/Granted Permission sought to share information with : Facility Retail banker granted to share information with : Yes, Verbal Permission Granted  Share Information with NAME: Linda/POA  Permission granted to share info w AGENCY: SNF  Permission granted to share info w Relationship: Daughter     Emotional Assessment   Attitude/Demeanor/Rapport: Unable to Assess Affect (typically observed): Unable to Assess Orientation: : Oriented to Self Alcohol / Substance Use: Not Applicable Psych Involvement: No (comment)  Admission diagnosis:  Altered mental status, unspecified altered mental status type [R41.82] AMS (altered mental status) [R41.82] Patient Active Problem List   Diagnosis Date Noted  . Palpitations 01/08/2021  . AMS (altered mental status) 01/08/2021  . History of seizure 01/08/2021  . S/P primary angioplasty with coronary stent 01/08/2021  . Chronic diastolic CHF (congestive heart failure) ([place]) 01/08/2021  . CKD (chronic kidney disease) stage 3, GFR 30-59 ml/min (HCC) 08/29/2020  . Depression 08/29/2020  . COVID-19 virus infection 02/05/2020  . Unstable angina ([place]) 10/16/2019  . Non-ST elevation (NSTEMI) myocardial  infarction ([place]) 10/16/2019  . Nausea 06/23/2018  . CHF (congestive heart failure) ([place]) 04/03/2018  . AKI (acute kidney injury) ([place]) 11/20/2017  . Dehydration   . Generalized weakness 11/18/2017  . Second degree burn of foot 11/18/2017  . Seizures ([place]) 07/17/2016  . Hypertensive urgency 07/17/2016  . Acute diastolic CHF (congestive heart failure) ([place]) 07/17/2016  .  UTI (urinary tract infection) 02/28/2016  . Urinary incontinence 10/28/2015  . Alopecia 08/12/2015  . TIA (transient ischemic attack) 07/01/2015  . Acute encephalopathy 06/24/2015  . Protein-calorie malnutrition, severe ([place]) 04/19/2015  . Fever   . Abdominal abscess   . Pressure ulcer 04/07/2015  . Acute kidney injury ([place])   . Sepsis ([place])   . Stroke ([place])   . Facial droop   . Confusion 03/26/2015  . Malnutrition of moderate degree ([place]) 03/20/2015  . Small bowel obstruction s/p exlap/LOA/decompression 03/25/2015 03/18/2015  . Essential hypertension 03/18/2015  . Noncompliance with medication regimen 12/07/2014  . Fall against object 06/24/2014  . Contusion of left hand 06/24/2014  . Contusion of left hip 06/24/2014  . Loss of weight 04/12/2014  . Malignant hypertension with renal failure and congestive heart failure ([place]) 09/23/2013  . Knee pain, bilateral 07/27/2013  . Chronic fatigue disorder 01/19/2013  . Vaginitis due to Candida 09/02/2012  . Sinusitis 09/02/2012  . Anemia in other chronic diseases classified elsewhere 09/02/2012  . Polymyalgia rheumatica ([place]) 09/10/2011  . Vertigo 08/21/2011  . Fatigue 05/11/2011  . ANEMIA OF CHRONIC DISEASE 09/06/2010  . KNEE PAIN, CHRONIC 05/26/2010  . DIZZINESS 05/11/2010  . SHOULDER PAIN 04/25/2010  . FREQUENCY, URINARY 03/16/2010  . CONSTIPATION, CHRONIC 01/17/2010  . FOOT PAIN 01/17/2009  . Rash and other nonspecific skin eruption 01/17/2009  . TINNITUS NOS 10/18/2008  . B12 deficiency 08/10/2008  . LOW BACK PAIN 06/23/2008  . CHEST WALL PAIN 06/23/2008  . Dysuria 03/18/2008  . Abdominal pain 03/18/2008  . Hyperlipidemia 12/24/2007  . DYSPNEA 12/24/2007  . Anxiety disorder 12/07/2007  . GERD 12/07/2007  . CHEST PAIN 12/07/2007  . OTHER SPEC FORMS CHRONIC ISCHEMIC HEART DISEASE 12/01/2007  . Type 2 diabetes mellitus with diabetic nephropathy, without long-term current use of insulin ([place]) 11/28/2007  . Pain in joint  11/28/2007  . Gout 09/17/2007  . Adjustment disorder with mixed anxiety and depressed mood 09/17/2007  . Coronary atherosclerosis 09/17/2007  . Disorder resulting from impaired renal function 09/17/2007  . OSTEOPOROSIS 09/17/2007  . DVT, HX OF 09/17/2007  . PANCREATITIS, HX OF 09/17/2007   PCP:  Cassandria Anger, MD Pharmacy:   [facility], [place] [address] Phone: [phone] Fax: [phone]  [place], [place] [address] Phone: [phone] Fax: [phone]     Social Determinants of Health (SDOH) Interventions    Readmission Risk Interventions Readmission Risk Prevention Plan 02/08/2020  Transportation Screening Complete  Medication Review ([place]) Complete  PCP or Specialist appointment within 3-5 days of discharge Complete  HRI or Home Care Consult Complete  SW Recovery Care/Counseling Consult Complete  Palliative Care Screening Not [place] Complete  Some recent data might be hidden

## 2021-01-10 NOTE — NC FL2 (Signed)
[place] MEDICAID FL2 LEVEL OF CARE SCREENING TOOL     IDENTIFICATION  Patient Name: [name] Birthdate: 05/31/1933 Sex: female Admission Date (Current Location): 01/08/2021  [facility] and [place] Number:  Producer, television/film/video and Address:  The . [facility], [address], [place], [place] [address]      Provider Number: [id number]  Attending Physician Name and Address:  Azucena Fallen, MD  Relative Name and Phone Number:       Current Level of Care: Hospital Recommended Level of Care: Skilled Nursing Facility Prior Approval Number:    Date Approved/Denied:   PASRR Number: [id number]  Discharge Plan: SNF    Current Diagnoses: Patient Active Problem List   Diagnosis Date Noted   Palpitations 01/08/2021   AMS (altered mental status) 01/08/2021   History of seizure 01/08/2021   S/P primary angioplasty with coronary stent 01/08/2021   Chronic diastolic CHF (congestive heart failure) (HCC) 01/08/2021   CKD (chronic kidney disease) stage 3, GFR 30-59 ml/min (HCC) 08/29/2020   Depression 08/29/2020   COVID-19 virus infection 02/05/2020   Unstable angina (HCC) 10/16/2019   Non-ST elevation (NSTEMI) myocardial infarction (HCC) 10/16/2019   Nausea 06/23/2018   CHF (congestive heart failure) (HCC) 04/03/2018   AKI (acute kidney injury) (HCC) 11/20/2017   Dehydration    Generalized weakness 11/18/2017   Second degree burn of foot 11/18/2017   Seizures (HCC) 07/17/2016   Hypertensive urgency 07/17/2016   Acute diastolic CHF (congestive heart failure) (HCC) 07/17/2016   UTI (urinary tract infection) 02/28/2016   Urinary incontinence 10/28/2015   Alopecia 08/12/2015   TIA (transient ischemic attack) 07/01/2015   Acute encephalopathy 06/24/2015   Protein-calorie malnutrition, severe (HCC) 04/19/2015   Fever    Abdominal abscess    Pressure ulcer 04/07/2015   Acute kidney injury (HCC)    Sepsis (HCC)     Stroke (HCC)    Facial droop    Confusion 03/26/2015   Malnutrition of moderate degree (HCC) 03/20/2015   Small bowel obstruction s/p exlap/LOA/decompression 03/25/2015 03/18/2015   Essential hypertension 03/18/2015   Noncompliance with medication regimen 12/07/2014   Fall against object 06/24/2014   Contusion of left hand 06/24/2014   Contusion of left hip 06/24/2014   Loss of weight 04/12/2014   Malignant hypertension with renal failure and congestive heart failure (HCC) 09/23/2013   Knee pain, bilateral 07/27/2013   Chronic fatigue disorder 01/19/2013   Vaginitis due to Candida 09/02/2012   Sinusitis 09/02/2012   Anemia in other chronic diseases classified elsewhere 09/02/2012   Polymyalgia rheumatica (HCC) 09/10/2011   Vertigo 08/21/2011   Fatigue 05/11/2011   ANEMIA OF CHRONIC DISEASE 09/06/2010   KNEE PAIN, CHRONIC 05/26/2010   DIZZINESS 05/11/2010   SHOULDER PAIN 04/25/2010   FREQUENCY, URINARY 03/16/2010   CONSTIPATION, CHRONIC 01/17/2010   FOOT PAIN 01/17/2009   Rash and other nonspecific skin eruption 01/17/2009   TINNITUS NOS 10/18/2008   B12 deficiency 08/10/2008   LOW BACK PAIN 06/23/2008   CHEST WALL PAIN 06/23/2008   Dysuria 03/18/2008   Abdominal pain 03/18/2008   Hyperlipidemia 12/24/2007   DYSPNEA 12/24/2007   Anxiety disorder 12/07/2007   GERD 12/07/2007   CHEST PAIN 12/07/2007   OTHER SPEC FORMS CHRONIC ISCHEMIC HEART DISEASE 12/01/2007   Type 2 diabetes mellitus with diabetic nephropathy, without long-term current use of insulin (HCC) 11/28/2007   Pain in joint 11/28/2007   Gout 09/17/2007   Adjustment disorder with mixed anxiety and depressed mood 09/17/2007  Coronary atherosclerosis 09/17/2007   Disorder resulting from impaired renal function 09/17/2007   OSTEOPOROSIS 09/17/2007   DVT, HX OF 09/17/2007   PANCREATITIS, HX OF 09/17/2007    Orientation RESPIRATION BLADDER Height & Weight      Self  Normal Incontinent Weight: 168 lb 3.4 oz (76.3 kg) Height:  5\' 4"  (162.6 cm)  BEHAVIORAL SYMPTOMS/MOOD NEUROLOGICAL BOWEL NUTRITION STATUS      Continent Diet (See DC Summary)  AMBULATORY STATUS COMMUNICATION OF NEEDS Skin   Limited Assist Verbally Normal                       Personal Care Assistance Level of Assistance  Bathing,Feeding,Dressing Bathing Assistance: Limited assistance Feeding assistance: Independent Dressing Assistance: Limited assistance     Functional Limitations Info  Sight,Hearing,Speech Sight Info: Adequate Hearing Info: Adequate Speech Info: Adequate    SPECIAL CARE FACTORS FREQUENCY  PT (By licensed PT),OT (By licensed OT)     PT Frequency: 5x/week OT Frequency: 5x/week            Contractures Contractures Info: Not present    Additional Factors Info  Code Status,Allergies Code Status Info: Full Allergies Info: Ativan (Lorazepam), Tramadol Hcl, Amlodipine Besylate, Atenolol, Benazepril, Benicar (Olmesartan Medoxomil), Cozaar, Hydralazine, Hydrochlorothiazide W-triamterene, Hydrocodone, Hydroxyzine Pamoate, Iodine, Lisinopril, Peach Flavor, Penicillins, Pravastatin, Prednisolone, Strawberry Flavor, Tramadol Hcl, Benadryl (Diphenhydramine)           Current Medications (01/10/2021):  This is the current hospital active medication list Current Facility-Administered Medications  Medication Dose Route Frequency Provider Last Rate Last Admin   acetaminophen (TYLENOL) tablet 650 mg  650 mg Oral Q6H PRN Chotiner, 03/12/2021, MD       amLODipine (NORVASC) tablet 10 mg  10 mg Oral Daily Tu, Ching T, DO   10 mg at 01/10/21 1033   aspirin EC tablet 81 mg  81 mg Oral Daily Tu, Ching T, DO   81 mg at 01/10/21 1033   azithromycin (ZITHROMAX) tablet 500 mg  500 mg Oral Daily Pham, Minh Q, RPH-CPP       carvedilol (COREG) tablet 25 mg  25 mg Oral BID WC Tu, Ching T, DO   25 mg at 01/10/21 1033   cefTRIAXone (ROCEPHIN) 2 g in sodium chloride  0.9 % 100 mL IVPB  2 g Intravenous Q24H Pham, Minh Q, RPH-CPP 200 mL/hr at 01/09/21 1727 2 g at 01/09/21 1727   citalopram (CELEXA) tablet 10 mg  10 mg Oral Daily Tu, Ching T, DO   10 mg at 01/10/21 1033   enoxaparin (LOVENOX) injection 40 mg  40 mg Subcutaneous Daily Tu, Ching T, DO   40 mg at 01/10/21 1155   HYDROmorphone (DILAUDID) injection 0.5 mg  0.5 mg Intravenous Q3H PRN Chotiner, 03/12/21, MD   0.5 mg at 01/09/21 0127   potassium chloride SA (KLOR-CON) CR tablet 20 mEq  20 mEq Oral Q4H 01/11/21, MD   20 mEq at 01/10/21 1033     Discharge Medications: Please see discharge summary for a list of discharge medications.  Relevant Imaging Results:  Relevant Lab Results:   Additional Information SSN: [id number]  [id number], LCSW

## 2021-01-10 NOTE — Evaluation (Signed)
Physical Therapy Evaluation Patient Details Name: [name] MRN: [id number] DOB: 09/21/1933 Today's Date: 01/10/2021   History of Present Illness  [name] is a 87 y.o. female PMHx:  for CAD s/p stent x2, chronic diastolic heart failure, hypertension, TIA, type 2 diabetes, history of seizure, CKD stage III, PMR, depression who presents with concerns of altered mental status. EEG negative for seizures and MRI brain negative for acute abnormality.  Clinical Impression  Pt admitted with above diagnosis. Pt fatigued and somewhat lethargic today, not mentating normally. She thought she was at home and man was trying to get in the screen door. Her daughter reports that today is the worst she has seen her from a cognitive standpoint. Functionally she needed increased time for all mobility as well as verbal and tactile cues to initiate and sequence. Min A given for bed mobility but pt needed mod A to stand and pivot to recliner. She could not pick her feet up in standing so could not progress ambulation past the chair safely. Rec continued rehab with 24/7 supervision before home.  Pt currently with functional limitations due to the deficits listed below (see PT Problem List). Pt will benefit from skilled PT to increase their independence and safety with mobility to allow discharge to the venue listed below.       Follow Up Recommendations SNF;Supervision/Assistance - 24 hour    Equipment Recommendations  None recommended by PT    Recommendations for Other Services       Precautions / Restrictions Precautions Precautions: Fall Restrictions Weight Bearing Restrictions: No      Mobility  Bed Mobility Overal bed mobility: Needs Assistance Bed Mobility: Supine to Sit     Supine to sit: Min assist     General bed mobility comments: max vc's for sequencing and tactile cues for sliding LE's off bed. Min A for elevation of trunk into sitting    Transfers Overall transfer level:  Needs assistance Equipment used: Rolling walker (2 wheeled);None Transfers: Sit to/from [employer] Sit to Stand: Mod assist Stand pivot transfers: Mod assist       General transfer comment: pt fearful of falling, mod A for power up and pt with posterior lean against bed, mod A for fwd wt shift. Pt could not step feet and move RW so RW removed for SPT and therapist stood in front of pt for increased support.  Ambulation/Gait Ambulation/Gait assistance: Mod assist Gait Distance (Feet): 2 Feet Assistive device: None Gait Pattern/deviations: Step-to pattern;Shuffle Gait velocity: decreased Gait velocity interpretation: <1.31 ft/sec, indicative of household ambulator General Gait Details: pt only able to slide feet. Mod support given with facilitation at hips to step towards chair. Pt could not tolerate further ambulation than bed to chair today  Stairs            Wheelchair Mobility    Modified Rankin (Stroke Patients Only)       Balance Overall balance assessment: Needs assistance Sitting-balance support: Bilateral upper extremity supported Sitting balance-Leahy Scale: Poor Sitting balance - Comments: unable to sit without UE support, frequently leaned onto elbow due to fatigue Postural control: Posterior lean Standing balance support: Bilateral upper extremity supported Standing balance-Leahy Scale: Poor Standing balance comment: needs UE and external support to stand                             Pertinent Vitals/Pain Pain Assessment: Faces Faces Pain Scale: Hurts a little  bit Pain Location: knees Pain Descriptors / Indicators: Sore Pain Intervention(s): Limited activity within patient's tolerance;Monitored during session    Home Living Family/patient expects to be discharged to:: Private residence Living Arrangements: Spouse/significant other;Children Available Help at Discharge: Family;Available PRN/intermittently Type of Home:  House Home Access: Stairs to enter Entrance Stairs-Rails: Can reach both Entrance Stairs-Number of Steps: 4 Home Layout: 1/2 bath on main level;Two level;Bed/bath upstairs (has a chair lift) Home Equipment: Walker - 4 wheels;Shower seat;Grab bars - tub/shower;Hand held shower head (chair lift) Additional Comments: son works. Husband is blind and has dementia. [name] daughter reports she has been having a lot of trouble with 4 stairs into home    Prior Function Level of Independence: Needs assistance   Gait / Transfers Assistance Needed: has been progressively more limited past few months. Daughter reports she has been mostly sitting  ADL's / Homemaking Assistance Needed: assist needed for homemaking        Hand Dominance   Dominant Hand: Right    Extremity/Trunk Assessment   Upper Extremity Assessment Upper Extremity Assessment: Defer to OT evaluation    Lower Extremity Assessment Lower Extremity Assessment: Generalized weakness    Cervical / Trunk Assessment Cervical / Trunk Assessment: Kyphotic  Communication   Communication: HOH  Cognition Arousal/Alertness: Lethargic Behavior During Therapy: WFL for tasks assessed/performed Overall Cognitive Status: Impaired/Different from baseline Area of Impairment: Memory;Attention;Orientation;Following commands;Safety/judgement;Awareness;Problem solving                 Orientation Level: Disoriented to;Situation;Time;Place Current Attention Level: Sustained Memory: Decreased short-term memory Following Commands: Follows one step commands inconsistently;Follows one step commands with increased time Safety/Judgement: Decreased awareness of safety;Decreased awareness of deficits Awareness: Intellectual Problem Solving: Slow processing;Requires verbal cues;Difficulty sequencing;Decreased initiation;Requires tactile cues General Comments: pt thinks she is at home and there is a man trying to get into her room. When she was  reoriented she was embarassed but a few minutes later though she was at home again.      General Comments General comments (skin integrity, edema, etc.): pt's daughter reports that pt's husband staying with another daughter. She also reports that this is the worst she has seen her mother cognitively    Exercises General Exercises - Lower Extremity Ankle Circles/Pumps: AROM;Both;10 reps;Seated Quad Sets: AROM;Both;10 reps;Seated Hip ABduction/ADduction: AAROM;Both;10 reps;Seated   Assessment/Plan    PT Assessment Patient needs continued PT services  PT Problem List Decreased strength;Decreased activity tolerance;Decreased balance;Decreased mobility;Decreased coordination;Decreased cognition;Decreased knowledge of use of DME;Decreased safety awareness;Decreased knowledge of precautions;Obesity       PT Treatment Interventions DME instruction;Gait training;Functional mobility training;Therapeutic activities;Therapeutic exercise;Balance training;Neuromuscular re-education;Cognitive remediation;Patient/family education    PT Goals (Current goals can be found in the Care Plan section)  Acute Rehab PT Goals Patient Stated Goal: to go home PT Goal Formulation: With patient/family Time For Goal Achievement: 01/24/21 Potential to Achieve Goals: Fair    Frequency Min 2X/week   Barriers to discharge Inaccessible home environment;Decreased caregiver support 4 STE and husband cannot care for pt    Co-evaluation               AM-PAC PT "6 Clicks" Mobility  Outcome Measure Help needed turning from your back to your side while in a flat bed without using bedrails?: A Lot Help needed moving from lying on your back to sitting on the side of a flat bed without using bedrails?: A Lot Help needed moving to and from a bed to a chair (including a wheelchair)?: A Lot  Help needed standing up from a chair using your arms (e.g., wheelchair or bedside chair)?: A Lot Help needed to walk in hospital  room?: A Lot Help needed climbing 3-5 steps with a railing? : Total 6 Click Score: 11    End of Session Equipment Utilized During Treatment: Gait belt Activity Tolerance: Patient limited by fatigue;Patient limited by lethargy Patient left: in chair;with call bell/phone within reach;with family/visitor present;with chair alarm set Nurse Communication: Mobility status PT Visit Diagnosis: Muscle weakness (generalized) (M62.81);Difficulty in walking, not elsewhere classified (R26.2)    Time: 1950-9326 PT Time Calculation (min) (ACUTE ONLY): 34 min   Charges:   PT Evaluation $PT Eval Moderate Complexity: 1 Mod PT Treatments $Therapeutic Activity: 8-22 mins        Lyanne Co, PT  Acute Rehab Services  Pager [phone] Office [phone]   Lawana Chambers Libra Gatz 01/10/2021, 12:49 PM

## 2021-01-10 NOTE — Telephone Encounter (Signed)
Carvedilol is rx by pt cardiologist. We have never filled Keppra.Marland KitchenRaechel Chute

## 2021-01-10 NOTE — Progress Notes (Signed)
Appropriate Use Committee Chart Review  Chart reviewed by the physician advisor with input from [facility] and the attending MD as needed for review of the appropriateness for SNF referral.  TOC notes, PT/OT/ST notes, nursing notes and physician notes reviewed for medical necessity to determine if the patient's needs are appropriate for short-term rehab to return to a prior level of function versus the likely need for custodial care.  At this time, the patient meets Medicare criteria for SNF placement.   Per OT notes: Pt PTA: pt living with blind spouse and son who recently has been hospitalized with PNA. Pt was independent with ADL, performing own medication management (Some of her medications are sent via mail and prepackaged-others her daughter picks up) and walking up and down stairs to bedroom on 2nd floor. Pt using rollator for mobility. Pt currently, limited by decreased strength, decreased activity tolerance and ecreased ability to properly care for self. Pt minA for bed mobility and minA for transfers with RW from bed to recliner. Pt modA overall for ADL and requires increased time for all tasks.    Recommendations: The patient is SNF appropriate for short-term rehab/skilled nursing interventions assuming she does not progress to the point of safely discharging home. TOC should be consulted to begin working up for SNF placement, if needed.   Hillery Aldo, MD Chief Physician Advisor  01/10/2021 12:55 PM

## 2021-01-11 ENCOUNTER — Inpatient Hospital Stay (HOSPITAL_COMMUNITY): Payer: Medicare HMO

## 2021-01-11 DIAGNOSIS — R4182 Altered mental status, unspecified: Secondary | ICD-10-CM | POA: Diagnosis not present

## 2021-01-11 DIAGNOSIS — I5032 Chronic diastolic (congestive) heart failure: Secondary | ICD-10-CM | POA: Diagnosis not present

## 2021-01-11 DIAGNOSIS — Z955 Presence of coronary angioplasty implant and graft: Secondary | ICD-10-CM | POA: Diagnosis not present

## 2021-01-11 DIAGNOSIS — I10 Essential (primary) hypertension: Secondary | ICD-10-CM | POA: Diagnosis not present

## 2021-01-11 LAB — COMPREHENSIVE METABOLIC PANEL
ALT: 12 U/L (ref 0–44)
AST: 33 U/L (ref 15–41)
Albumin: 3.3 g/dL — ABNORMAL LOW (ref 3.5–5.0)
Alkaline Phosphatase: 53 U/L (ref 38–126)
Anion gap: 15 (ref 5–15)
BUN: 12 mg/dL (ref 8–23)
CO2: 18 mmol/L — ABNORMAL LOW (ref 22–32)
Calcium: 9.1 mg/dL (ref 8.9–10.3)
Chloride: 105 mmol/L (ref 98–111)
Creatinine, Ser: 0.91 mg/dL (ref 0.44–1.00)
GFR, Estimated: >60 mL/min (ref >60)
Glucose, Bld: 95 mg/dL (ref 70–99)
Potassium: 3.9 mmol/L (ref 3.5–5.1)
Sodium: 138 mmol/L (ref 135–145)
Total Bilirubin: 1.2 mg/dL (ref 0.3–1.2)
Total Protein: 6.5 g/dL (ref 6.5–8.1)

## 2021-01-11 LAB — CBC
HCT: 29.3 % — ABNORMAL LOW (ref 36.0–46.0)
Hemoglobin: 9.3 g/dL — ABNORMAL LOW (ref 12.0–15.0)
MCH: 30.8 pg (ref 26.0–34.0)
MCHC: 31.7 g/dL (ref 30.0–36.0)
MCV: 97 fL (ref 80.0–100.0)
Platelets: 153 10*3/uL (ref 150–400)
RBC: 3.02 MIL/uL — ABNORMAL LOW (ref 3.87–5.11)
RDW: 12.1 % (ref 11.5–15.5)
WBC: 7.4 10*3/uL (ref 4.0–10.5)
nRBC: 0 % (ref 0.0–0.2)

## 2021-01-11 LAB — SARS CORONAVIRUS 2 (TAT 6-24 HRS): SARS Coronavirus 2: NEGATIVE

## 2021-01-11 LAB — TSH: TSH: 2.02 u[IU]/mL (ref 0.350–4.500)

## 2021-01-11 LAB — VITAMIN B12: Vitamin B-12: 2087 pg/mL — ABNORMAL HIGH (ref 180–914)

## 2021-01-11 MED ORDER — ALUM & MAG HYDROXIDE-SIMETH 200-200-20 MG/5ML PO SUSP
30.0000 mL | Freq: Four times a day (QID) | ORAL | Status: DC | PRN
  Administered 2021-01-11: 30 mL via ORAL
  Filled 2021-01-11: qty 30

## 2021-01-11 MED ORDER — PANTOPRAZOLE SODIUM 40 MG PO TBEC
40.0000 mg | DELAYED_RELEASE_TABLET | Freq: Every day | ORAL | Status: DC
  Administered 2021-01-11 – 2021-01-16 (×6): 40 mg via ORAL
  Filled 2021-01-11 (×6): qty 1

## 2021-01-11 MED ORDER — THIAMINE HCL 100 MG PO TABS
100.0000 mg | ORAL_TABLET | Freq: Every day | ORAL | Status: DC
  Administered 2021-01-11 – 2021-01-16 (×6): 100 mg via ORAL
  Filled 2021-01-11 (×6): qty 1

## 2021-01-11 NOTE — Plan of Care (Signed)

## 2021-01-11 NOTE — Progress Notes (Signed)
c/o chest pressure at first then said it is burning like heartburn.  Her VSS.  She was asleep and it did wake her up.  Her daughter is at her side as well and very concerned.  She said it was her chest but she was holding her upper abdomen.  Now she says it is better but she was moaning in her sleep then woke up saying "[name], something is wrong".  She is calm now and fell back to sleep.  Dr Jerral Ralph was notified by secure chat.

## 2021-01-11 NOTE — Progress Notes (Signed)
PROGRESS NOTE        PATIENT DETAILS Name: [name] Age: 87 y.o. Sex: female Date of Birth: November 22, 1932 Admit Date: 01/08/2021 Admitting Physician Azucena Fallen, MD RUE:[id number], [name], MD  Brief Narrative: Patient is a 87 y.o. female with history of CAD s/p PCI, chronic diastolic heart failure, TIA, DM-2, CKD stage IIIa, PMR, depression, seizure disorder-who presented with altered mental status in the setting of noncompliance with antiseizure medications-patient was thought to have had a breakthrough seizure with resultant postictal state/metabolic encephalopathy and subsequently admitted to the hospitalist service.  See below for further details.  Significant events: 2/27>> admit for concern of breakthrough seizures-postictal state  Significant studies: 2/27>> CT abdomen/pelvis: No acute abnormality 2/27>> CT head: No acute intracranial abnormality 2/27>> chest x-ray: No pneumonia 2/28>> MRI brain: No acute intracranial findings. 2/28>> EEG: No seizures  Antimicrobial therapy: Rocephin: 2/27>> 3/2 Zithromax: 3/1>> 3/2  Microbiology data: 2/27>> urine culture: E. Coli-pansensitive 2/27>> blood culture: No growth 2/27>> Covid/influenza PCR: Negative  Procedures : None  Consults: Neurology  DVT Prophylaxis : enoxaparin (LOVENOX) injection 40 mg Start: 01/09/21 1000   Subjective: Awake/alert-answering most of my questions appropriately-Per RN-had delirium last night.  Assessment/Plan: Possible breakthrough seizures: Some concern for noncompliance-work-up as above-have formally consulted neurology today.  Await Keppra levels.  Per H&P/family report-some concern that Keppra was causing patient to be fatigue/sleepy/sedated.  Will await recommendations from neurology regarding reinitiation of antiepileptics.  Acute metabolic encephalopathy/postictal state: Better-completely awake and alert this morning.  May have some underlying  cognitive dysfunction at baseline-maintain delirium precautions.  Possible aspiration pneumonia: Clinically improved-afebrile-doubt requires any further antimicrobial therapy at this point-stop Rocephin/Zithromax on 3/2.  Asymptomatic bacteriuria: Urine culture positive-UA without significant bacteriuria-patient has no symptoms consistent with UTI.  Does not need antimicrobial therapy-has completed 3 days of IV Rocephin in any event.  History of CAD-s/p PCI: No anginal symptoms-continue aspirin  Chronic diastolic heart failure: Euvolemic on exam  HTN: BP on the higher side-continue Coreg/amlodipine-if still elevated tomorrow-we will add another antihypertensive.  DM-2: Check A1c with a.m. labs-monitor closely for now  Depression: Relatively stable-continue Celexa  Rheumatoid arthritis: Hold methotrexate for now  Diet: Diet Order            Diet Heart Room service appropriate? Yes; Fluid consistency: Thin  Diet effective now                  Code Status: Full code   Family Communication: Spoke with Daughter-Linda-[phone] over phone on 3/2  Disposition Plan: Status is: Inpatient  Remains inpatient appropriate because:Inpatient level of care appropriate due to severity of illness   Dispo: The patient is from: Home              Anticipated d/c is to: SNF              Patient currently is not medically stable to d/c.   Difficult to place patient No  Barriers to Discharge: Await neurology input regarding antiepileptics-stop antibiotics-and monitor x24 hours before consideration of discharge.  Antimicrobial agents: Anti-infectives (From admission, onward)   Start     Dose/Rate Route Frequency Ordered Stop   01/10/21 1400  azithromycin (ZITHROMAX) tablet 500 mg        500 mg Oral Daily 01/10/21 0927 01/13/21 0959   01/08/21 1815  cefTRIAXone (ROCEPHIN) 2 g in sodium  chloride 0.9 % 100 mL IVPB        2 g 200 mL/hr over 30 Minutes Intravenous Every 24 hours 01/08/21  1807 01/12/21 2359   01/08/21 1815  azithromycin (ZITHROMAX) 500 mg in sodium chloride 0.9 % 250 mL IVPB  Status:  Discontinued        500 mg 250 mL/hr over 60 Minutes Intravenous Every 24 hours 01/08/21 1807 01/10/21 0927       Time spent: 25 minutes-Greater than 50% of this time was spent in counseling, explanation of diagnosis, planning of further management, and coordination of care.  MEDICATIONS: Scheduled Meds: . amLODipine  10 mg Oral Daily  . aspirin EC  81 mg Oral Daily  . azithromycin  500 mg Oral Daily  . carvedilol  25 mg Oral BID WC  . citalopram  10 mg Oral Daily  . enoxaparin (LOVENOX) injection  40 mg Subcutaneous Daily   Continuous Infusions: . cefTRIAXone (ROCEPHIN)  IV 2 g (01/10/21 1651)   PRN Meds:.acetaminophen, HYDROmorphone (DILAUDID) injection   PHYSICAL EXAM: Vital signs: Vitals:   01/10/21 2320 01/11/21 0334 01/11/21 0719 01/11/21 0816  BP: (!) 169/66 (!) 175/64 (!) 192/80 (!) 166/58  Pulse: 72 77 79 75  Resp: 18 18 18    Temp: 98.4 F (36.9 C) 98.1 F (36.7 C) 99 F (37.2 C)   TempSrc: Oral Oral Oral   SpO2: 100% 100% 100%   Weight:      Height:       Filed Weights   01/08/21 1756 01/08/21 2355  Weight: 76.3 kg 76.3 kg   Body mass index is 28.87 kg/m.   Gen Exam:Alert awake-not in any distress HEENT:atraumatic, normocephalic Chest: B/L clear to auscultation anteriorly CVS:S1S2 regular Abdomen:soft non tender, non distended Extremities:no edema Neurology: Non focal-but has generalized weakness Skin: no rash  I have personally reviewed following labs and imaging studies  LABORATORY DATA: CBC: Recent Labs  Lab 01/08/21 1752 01/09/21 0312 01/10/21 0444 01/11/21 0345  WBC 6.6 6.4 6.0 7.4  NEUTROABS 4.8  --   --   --   HGB 8.9* 8.7* 8.4* 9.3*  HCT 28.8* 26.8* 25.8* 29.3*  MCV 99.7 98.2 96.3 97.0  PLT 169 157 151 153    Basic Metabolic Panel: Recent Labs  Lab 01/08/21 1752 01/09/21 0312 01/10/21 0444 01/11/21 0345   NA 135 139 138 138  K 4.1 4.2 3.2* 3.9  CL 105 108 106 105  CO2 19* 19* 21* 18*  GLUCOSE 108* 81 69* 95  BUN 19 17 9 12   CREATININE 1.00 0.94 0.86 0.91  CALCIUM 9.1 9.1 8.9 9.1    GFR: Estimated Creatinine Clearance: 45.2 mL/min (by C-G formula based on SCr of 0.91 mg/dL).  Liver Function Tests: Recent Labs  Lab 01/08/21 1752 01/10/21 0444 01/11/21 0345  AST 25 27 33  ALT 11 11 12   ALKPHOS 58 48 53  BILITOT 1.2 1.4* 1.2  PROT 7.0 6.1* 6.5  ALBUMIN 3.4* 3.0* 3.3*   No results for input(s): LIPASE, AMYLASE in the last 168 hours. Recent Labs  Lab 01/08/21 1955  AMMONIA 17    Coagulation Profile: Recent Labs  Lab 01/08/21 1808  INR 1.2    Cardiac Enzymes: No results for input(s): CKTOTAL, CKMB, CKMBINDEX, TROPONINI in the last 168 hours.  BNP (last 3 results) No results for input(s): PROBNP in the last 8760 hours.  Lipid Profile: No results for input(s): CHOL, HDL, LDLCALC, TRIG, CHOLHDL, LDLDIRECT in the last 72 hours.  Thyroid Function  Tests: No results for input(s): TSH, T4TOTAL, FREET4, T3FREE, THYROIDAB in the last 72 hours.  Anemia Panel: No results for input(s): VITAMINB12, FOLATE, FERRITIN, TIBC, IRON, RETICCTPCT in the last 72 hours.  Urine analysis:    Component Value Date/Time   COLORURINE YELLOW 01/08/2021 1856   APPEARANCEUR CLEAR 01/08/2021 1856   LABSPEC 1.014 01/08/2021 1856   LABSPEC 1.020 04/23/2006 1313   PHURINE 6.0 01/08/2021 1856   GLUCOSEU NEGATIVE 01/08/2021 1856   GLUCOSEU NEGATIVE 02/28/2016 1559   HGBUR NEGATIVE 01/08/2021 1856   BILIRUBINUR NEGATIVE 01/08/2021 1856   BILIRUBINUR Negative 04/23/2006 1313   KETONESUR NEGATIVE 01/08/2021 1856   PROTEINUR 100 (A) 01/08/2021 1856   UROBILINOGEN 0.2 02/28/2016 1559   NITRITE NEGATIVE 01/08/2021 1856   LEUKOCYTESUR NEGATIVE 01/08/2021 1856   LEUKOCYTESUR Trace 04/23/2006 1313    Sepsis Labs: Lactic Acid, Venous    Component Value Date/Time   LATICACIDVEN 1.0 01/08/2021  1752    MICROBIOLOGY: Recent Results (from the past 240 hour(s))  Urine culture     Status: Abnormal   Collection Time: 01/08/21  6:56 PM   Specimen: Urine, Random  Result Value Ref Range Status   Specimen Description URINE, RANDOM  Final   Special Requests   Final    NONE Performed at [facility], [address], [place], [place] [address]    Culture 50,000 COLONIES/mL ESCHERICHIA COLI (A)  Final   Report Status 01/10/2021 FINAL  Final   Organism ID, Bacteria ESCHERICHIA COLI (A)  Final      Susceptibility   Escherichia coli - MIC*    AMPICILLIN 4 SENSITIVE Sensitive     CEFAZOLIN <=4 SENSITIVE Sensitive     CEFEPIME <=0.12 SENSITIVE Sensitive     CEFTRIAXONE <=0.25 SENSITIVE Sensitive     CIPROFLOXACIN <=0.25 SENSITIVE Sensitive     GENTAMICIN <=1 SENSITIVE Sensitive     IMIPENEM <=0.25 SENSITIVE Sensitive     NITROFURANTOIN <=16 SENSITIVE Sensitive     TRIMETH/SULFA <=20 SENSITIVE Sensitive     AMPICILLIN/SULBACTAM <=2 SENSITIVE Sensitive     PIP/TAZO <=4 SENSITIVE Sensitive     * 50,000 COLONIES/mL ESCHERICHIA COLI  Resp Panel by RT-PCR (Flu A&B, Covid) Urine, Clean Catch     Status: None   Collection Time: 01/08/21  6:56 PM   Specimen: Urine, Clean Catch; Nasopharyngeal(NP) swabs in vial transport medium  Result Value Ref Range Status   SARS Coronavirus 2 by RT PCR NEGATIVE NEGATIVE Final    Comment: (NOTE) SARS-CoV-2 target nucleic acids are NOT DETECTED.  The SARS-CoV-2 RNA is generally detectable in upper respiratory specimens during the acute phase of infection. The lowest concentration of SARS-CoV-2 viral copies this assay can detect is 138 copies/mL. A negative result does not preclude SARS-Cov-2 infection and should not be used as the sole basis for treatment or other patient management decisions. A negative result may occur with  improper specimen collection/handling, submission of specimen other than nasopharyngeal swab, presence of viral mutation(s)  within the areas targeted by this assay, and inadequate number of viral copies(<138 copies/mL). A negative result must be combined with clinical observations, patient history, and epidemiological information. The expected result is Negative.  Fact Sheet for Patients:  BloggerCourse.com  Fact Sheet for Healthcare Providers:  SeriousBroker.it  This test is no t yet approved or cleared by the [place] FDA and  has been authorized for detection and/or diagnosis of SARS-CoV-2 by FDA under an Emergency Use Authorization (EUA). This EUA will remain  in effect (  meaning this test can be used) for the duration of the COVID-19 declaration under Section 564(b)(1) of the Act, 21 U.S.C.section 360bbb-3(b)(1), unless the authorization is terminated  or revoked sooner.       Influenza A by PCR NEGATIVE NEGATIVE Final   Influenza B by PCR NEGATIVE NEGATIVE Final    Comment: (NOTE) The Xpert Xpress SARS-CoV-2/FLU/RSV plus assay is intended as an aid in the diagnosis of influenza from Nasopharyngeal swab specimens and should not be used as a sole basis for treatment. Nasal washings and aspirates are unacceptable for Xpert Xpress SARS-CoV-2/FLU/RSV testing.  Fact Sheet for Patients: BloggerCourse.com  Fact Sheet for Healthcare Providers: SeriousBroker.it  This test is not yet approved or cleared by the [place] FDA and has been authorized for detection and/or diagnosis of SARS-CoV-2 by FDA under an Emergency Use Authorization (EUA). This EUA will remain in effect (meaning this test can be used) for the duration of the COVID-19 declaration under Section 564(b)(1) of the Act, 21 U.S.C. section 360bbb-3(b)(1), unless the authorization is terminated or revoked.  Performed at [facility], [address], [place], [place] [address]   Blood Culture (routine x 2)     Status: None  (Preliminary result)   Collection Time: 01/08/21  7:27 PM   Specimen: Site Not Specified; Blood  Result Value Ref Range Status   Specimen Description SITE NOT SPECIFIED  Final   Special Requests   Final    BOTTLES DRAWN AEROBIC AND ANAEROBIC Blood Culture adequate volume   Culture   Final    NO GROWTH 2 DAYS Performed at [facility], [address], [place], [place] [address]    Report Status PENDING  Incomplete  Blood Culture (routine x 2)     Status: None (Preliminary result)   Collection Time: 01/08/21  7:55 PM   Specimen: BLOOD RIGHT ARM  Result Value Ref Range Status   Specimen Description BLOOD RIGHT ARM UPPER  Final   Special Requests   Final    BOTTLES DRAWN AEROBIC AND ANAEROBIC Blood Culture adequate volume   Culture   Final    NO GROWTH 2 DAYS Performed at [facility], [address], [place], [place] [address]    Report Status PENDING  Incomplete    RADIOLOGY STUDIES/RESULTS: EEG adult  Result Date: 01/09/2021 Charlsie Quest, MD     01/09/2021  9:49 AM Patient Name: [name] MRN: [id number] Epilepsy Attending: Charlsie Quest Referring Physician/Provider: Dr [name] Gutter Date: 01/09/2021 Duration: 33.25 mins Patient history: 87 year old female with history of seizures presented with altered mental status.  EEG to evaluate for seizures. Level of alertness: Awake, asleep AEDs during EEG study: Keppra Technical aspects: This EEG study was done with scalp electrodes positioned according to the 10-20 International system of electrode placement. Electrical activity was acquired at a sampling rate of  and reviewed with a high frequency filter of  and a low frequency filter of . EEG data were recorded continuously and digitally stored. Description: The posterior dominant rhythm consists of 9 Hz activity of moderate voltage (25-35 uV) seen predominantly in posterior head regions, symmetric and reactive to eye opening and eye closing. Sleep was  characterized by vertex waves, sleep spindles (12 to 14 Hz), maximal frontocentral region.  EEG showed intermittent generalized 3 to 6 Hz theta-delta slowing. Physiologic photic driving was not seen during photic stimulation.  Hyperventilation was not performed.   ABNORMALITY -Intermittent slow, generalized IMPRESSION: This study is  suggestive of mild diffuse encephalopathy, nonspecific etiology. No seizures or epileptiform discharges were seen throughout the recording. Priyanka Annabelle Harman     LOS: 1 day   Jeoffrey Massed, MD  [facility]    To contact the attending provider between 7A-7P or the covering provider during after hours 7P-7A, please log into the web site [email or web address] and access using universal [place] password for that web site. If you do not have the password, please call the hospital operator.  01/11/2021, 9:23 AM

## 2021-01-11 NOTE — Plan of Care (Signed)
Pt has visual hallucinations and is confused. Pt does not sleep at much at night. Updated Daughter [name] last night. Purewick in place. No BM. Tele in place.   Problem: Education: Goal: Knowledge of General Education information will improve Description: Including pain rating scale, medication(s)/side effects and non-pharmacologic comfort measures Outcome: Progressing   Problem: Health Behavior/Discharge Planning: Goal: Ability to manage health-related needs will improve Outcome: Progressing   Problem: Clinical Measurements: Goal: Ability to maintain clinical measurements within normal limits will improve Outcome: Progressing Goal: Will remain free from infection Outcome: Progressing Goal: Diagnostic test results will improve Outcome: Progressing Goal: Respiratory complications will improve Outcome: Progressing Goal: Cardiovascular complication will be avoided Outcome: Progressing   Problem: Activity: Goal: Risk for activity intolerance will decrease Outcome: Progressing   Problem: Nutrition: Goal: Adequate nutrition will be maintained Outcome: Progressing   Problem: Coping: Goal: Level of anxiety will decrease Outcome: Progressing   Problem: Elimination: Goal: Will not experience complications related to bowel motility Outcome: Progressing Goal: Will not experience complications related to urinary retention Outcome: Progressing   Problem: Pain Managment: Goal: General experience of comfort will improve Outcome: Progressing   Problem: Safety: Goal: Ability to remain free from injury will improve Outcome: Progressing   Problem: Skin Integrity: Goal: Risk for impaired skin integrity will decrease Outcome: Progressing

## 2021-01-11 NOTE — Progress Notes (Signed)
OT Cancellation Note  Patient Details Name: [name] MRN: [id number] DOB: 02-Jun-1933   Cancelled Treatment:    Reason Eval/Treat Not Completed: Patient at procedure or test/ unavailable. Tech present at bedside to place EEG wires. OT to check back as time allows.   Kallie Edward OTR/L Supplemental OT, Department of rehab services [phone]  Destanae R H. 01/11/2021, 2:31 PM

## 2021-01-11 NOTE — Progress Notes (Signed)
EKG and Maalox ordered.  Said she feels 100% better.

## 2021-01-11 NOTE — Progress Notes (Signed)
LTM EEG hooked up and running - no initial skin breakdown - push button tested - neuro notified.  

## 2021-01-11 NOTE — Consult Note (Signed)
Neurology Consultation  Reason for Consult: Acute encephalopathy  Referring Physician: Dr. Jerral Ralph  CC: Acute encephalopathy  History is obtained from: Chart review, Patient's daughter at bedside  HPI: [name] is a 87 y.o. female with a medical history significant for coronary artery disease s/p stent placement x 2, chronic diastolic heart failure, hypertension, hyperlipidemia, history of transient ischemic attack, history of seizure on home Keppra, chronic kidney disease stage III, polyarthritis, pancreatitis, hyperlipidemia, deep vein thrombosis, and type 2 diabetes mellitus who presented to the ED 2/27 for evaluation of altered mental status. A family member found [name] at home without clothing on, sitting on her stool with loss of bowel continence. She was admitted for further evaluation of altered mental status.    At baseline, [name] lives with her blind husband who also has dementia and her son lives with them as well. Her son has not been at home to help due to prolonged hospitalization but her daughter helps her mother with medical needs. Her daughter states that she sometimes misses her medications. She was prescribed doxycycline for a inner thigh burn approximately one month ago that she has not taken. Her home Keppra is provided by her PCP but daughter notes that the bottle is empty and she is unsure when the patient ran out. She does not follow with outpatient neurology for seizure management but per chart review, Keppra was started in 2017 due to seizure thought to be secondary to hypoglycemia. Recently, her dose was decreased to 500 mg daily from BID dosing due to increasing lethargy.   Per Dr. Sheryn Bison history and physical in September 2017 patient was admitted due to: "In the evening as she was getting ready for bed, she called her daughter to the bedroom saying she thought "my sugar is low". Her daughter fetched her some orange juice and soup to her room. About  10 minutes later she went back to check on how she liked it, and when she walked in the room her mother was lying down, unresponsive, with her mouth twitching. The daughter called her name and touched her but she did not respond. Daughter tried to sit her up and noted her body was stiff, so she immediately called 9-1-1.  EMS found the patient with normal glucose but lethargic and confused and activated CODE STROKE." Subsequently, she was discharged on Keppra and has not had a follow up with outpatient neurology since. Family unsure if Keppra should be continued.   ROS: A 14 point ROS was performed and is negative except as noted in the HPI.   Past Medical History:  Diagnosis Date  . Anemia    iron deficiency  . Aneurysm, thoracic aortic (HCC)   . Anxiety   . B12 deficiency   . CAD (coronary artery disease)    s/p stenting of LAD 1999- cath 5-08 EF normal LAD 30-40% restenosis. D1 50% D2 80% LCX & RCA minimal plaque  . Chronic back pain   . CKD (chronic kidney disease) stage 3, GFR 30-59 ml/min (HCC) 08/29/2020  . Constipation   . Depression   . Diabetes mellitus   . DVT (deep venous thrombosis) (HCC)   . GERD (gastroesophageal reflux disease)   . Gout   . HTN (hypertension)   . Hyperlipemia   . Obesity   . Osteoporosis   . Pancreatitis   . Polyarthritis    DJD/ possible PMR  . Renal insufficiency    Cr 1.2-1.3  . Seizures (HCC)   .  Tinnitus   . Urinary frequency   . Vertigo   . Vitamin D deficiency    Family History  Adopted: Yes  Problem Relation Age of Onset  . Diabetes Mother   . Hypertension Father    Social History:   reports that she has never smoked. She has never used smokeless tobacco. She reports that she does not drink alcohol and does not use drugs.  Medications  Current Facility-Administered Medications:  .  acetaminophen (TYLENOL) tablet 650 mg, 650 mg, Oral, Q6H PRN, Chotiner, Claudean Severance, MD, 650 mg at 01/10/21 1642 .  amLODipine (NORVASC) tablet 10 mg,  10 mg, Oral, Daily, Tu, Ching T, DO, 10 mg at 01/10/21 1033 .  aspirin EC tablet 81 mg, 81 mg, Oral, Daily, Tu, Ching T, DO, 81 mg at 01/10/21 1033 .  azithromycin (ZITHROMAX) tablet 500 mg, 500 mg, Oral, Daily, Pham, Minh Q, RPH-CPP, 500 mg at 01/10/21 1446 .  carvedilol (COREG) tablet 25 mg, 25 mg, Oral, BID WC, Tu, Ching T, DO, 25 mg at 01/11/21 0819 .  cefTRIAXone (ROCEPHIN) 2 g in sodium chloride 0.9 % 100 mL IVPB, 2 g, Intravenous, Q24H, Pham, Minh Q, RPH-CPP, Last Rate: 200 mL/hr at 01/10/21 1651, 2 g at 01/10/21 1651 .  citalopram (CELEXA) tablet 10 mg, 10 mg, Oral, Daily, Tu, Ching T, DO, 10 mg at 01/10/21 1033 .  enoxaparin (LOVENOX) injection 40 mg, 40 mg, Subcutaneous, Daily, Tu, Ching T, DO, 40 mg at 01/10/21 1155 .  HYDROmorphone (DILAUDID) injection 0.5 mg, 0.5 mg, Intravenous, Q3H PRN, Chotiner, Claudean Severance, MD, 0.5 mg at 01/09/21 0127  Exam: Current vital signs: BP (!) 166/58 (BP Location: Right Arm)   Pulse 75   Temp 99 F (37.2 C) (Oral)   Resp 18   Ht 5\' 4"  (1.626 m)   Wt 76.3 kg   SpO2 100%   BMI 28.87 kg/m  Vital signs in last 24 hours: Temp:  [97.7 F (36.5 C)-99.2 F (37.3 C)] 99 F (37.2 C) (03/02 0719) Pulse Rate:  [65-79] 75 (03/02 0816) Resp:  [18-19] 18 (03/02 0719) BP: (136-192)/(52-80) 166/58 (03/02 0816) SpO2:  [98 %-100 %] 100 % (03/02 0719)  GENERAL: Awake, alert, sitting up in the bed in no acute distress Head: - Normocephalic and atraumatic EENT: edentulous, no OP obstruction, dry mucous membranes LUNGS - Normal respiratory effort, non-labored breathing CV - Extremities warm without edema ABDOMEN - Soft, non-tender, non-distended Ext: warm, well perfused, without obvious deformity  NEURO:  Mental Status: alert to self, time, and amnestic to situation. She believes that she is at home even after redirection. She also has been experiencing frequent visual hallucinations. Speech is without aphasia. She is mildly dysarthric but edentulous at  baseline. Naming intact. Limited attention noted.  Cranial Nerves:  II: PERRL 2 mm/brisk. Visual fields full.  III, IV, VI: EOMI without ptosis V: Face sensation is intact and symmetric to light touch.  VII: Face is symmetric resting and smiling. Able to puff cheeks and raise eyebrows.  VIII: Hearing intact to voice IX, X: Palate elevation is symmetric. Phonation normal.  XI: Normal sternocleidomastoid and trapezius muscle strength XII: Tongue protrudes midline.   Motor: 4/5 strength is all muscle groups.  Tone is normal. Bulk is normal. Strength of bilateral lower extremities pain limited, osteoarthritis of the knees and right shoulder.  Sensation- Intact to light touch bilaterally in all four extremities. Extinction intact. Coordination: FTN intact bilaterally. HKS not assessed secondary to complaints of knee pain. No  pronator drift.  Gait- deferred  Labs I have reviewed labs in epic and the results pertinent to this consultation are: CBC    Component Value Date/Time   WBC 7.4 01/11/2021 0345   RBC 3.02 (L) 01/11/2021 0345   HGB 9.3 (L) 01/11/2021 0345   HGB 10.8 (L) 06/18/2006 0814   HCT 29.3 (L) 01/11/2021 0345   HCT 32.5 (L) 06/18/2006 0814   PLT 153 01/11/2021 0345   PLT 212 06/18/2006 0814   MCV 97.0 01/11/2021 0345   MCV 90.7 06/18/2006 0814   MCH 30.8 01/11/2021 0345   MCHC 31.7 01/11/2021 0345   RDW 12.1 01/11/2021 0345   RDW 14.4 06/18/2006 0814   LYMPHSABS 1.1 01/08/2021 1752   LYMPHSABS 1.1 06/18/2006 0814   MONOABS 0.6 01/08/2021 1752   MONOABS 0.5 06/18/2006 0814   EOSABS 0.0 01/08/2021 1752   EOSABS 0.1 06/18/2006 0814   BASOSABS 0.0 01/08/2021 1752   BASOSABS 0.0 06/18/2006 0814  CMP     Component Value Date/Time   NA 138 01/11/2021 0345   NA 134 (A) 04/25/2015 0000   K 3.9 01/11/2021 0345   CL 105 01/11/2021 0345   CO2 18 (L) 01/11/2021 0345   GLUCOSE 95 01/11/2021 0345   BUN 12 01/11/2021 0345   BUN 36 (A) 04/25/2015 0000   CREATININE 0.91  01/11/2021 0345   CALCIUM 9.1 01/11/2021 0345   PROT 6.5 01/11/2021 0345   ALBUMIN 3.3 (L) 01/11/2021 0345   AST 33 01/11/2021 0345   ALT 12 01/11/2021 0345   ALKPHOS 53 01/11/2021 0345   BILITOT 1.2 01/11/2021 0345   GFRNONAA >60 01/11/2021 0345   GFRAA 55 (L) 08/02/2020 1946   Lipid Panel     Component Value Date/Time   CHOL 169 06/25/2015 0410   TRIG 61 06/25/2015 0410   HDL 57 06/25/2015 0410   CHOLHDL 3.0 06/25/2015 0410   VLDL 12 06/25/2015 0410   LDLCALC 100 (H) 06/25/2015 0410   LDLDIRECT 114.8 01/10/2011 0923   Urinalysis    Component Value Date/Time   COLORURINE YELLOW 01/08/2021 1856   APPEARANCEUR CLEAR 01/08/2021 1856   LABSPEC 1.014 01/08/2021 1856   LABSPEC 1.020 04/23/2006 1313   PHURINE 6.0 01/08/2021 1856   GLUCOSEU NEGATIVE 01/08/2021 1856   GLUCOSEU NEGATIVE 02/28/2016 1559   HGBUR NEGATIVE 01/08/2021 1856   BILIRUBINUR NEGATIVE 01/08/2021 1856   BILIRUBINUR Negative 04/23/2006 1313   KETONESUR NEGATIVE 01/08/2021 1856   PROTEINUR 100 (A) 01/08/2021 1856   UROBILINOGEN 0.2 02/28/2016 1559   NITRITE NEGATIVE 01/08/2021 1856   LEUKOCYTESUR NEGATIVE 01/08/2021 1856   LEUKOCYTESUR Trace 04/23/2006 1313    Imaging I have reviewed the images obtained: CT-scan of the brain No acute intracranial abnormality.  MRI examination of the brain No acute or reversible finding. 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 Mild small vessel change of the hemispheric white matter, less than often seen at this age.  Assessment: 87 year old female with medical history as above who presents for evaluation of acute encephalopathy and visual hallucinations. - Brain imaging CT/MRI without evidence of acute or reversible cause of encephalopathy.  - EEG: "This study is suggestive of mild diffuse encephalopathy, nonspecific etiology. No seizures or epileptiform discharges were seen throughout the recording." - Examination reveals patient back to baseline mental status with  some residual disorientation and hallucinations.  - Work-up so far significant for temperature of 100.5 on arrival with some hypertension with SBP to 170's, CBC without leukocytosis, ammonia within defined limits, anemia with hemoglobin of 8.9,  negative urinalysis, CXR, and CT head.  - DDx includes toxic metabolic encephalopathy, seizure with post-ictal state, delirium, or ischemia/infarct etiology. With negative imaging, there is low suspicion for ischemia/infarct etiology.  - Unsure if antiepileptics are necessary due to possible provoked seizure in 2017 with resolution of provocation and without further seizures, however, hesitant to discontinue Keppra in the setting of encephalopathy at this time.  Recommendations: - Blood cultures pending per primary team - TSH pending - Continue Keppra home dose 500 mg daily  - Hold sedating medications at this time - Try to minimize deliriogenic medications as much as possible (J Am Geriatr Soc. 2012 Apr;60(4):616-31): benzodiazepines, anticholinergics, diphenhydramine, antihistamines, narcotics, Ambien/Lunesta/Sonata etc. - Environmental support for delirium - Continue to look for and treat possible modifiable risk factors for delirium.  These include deliriogenic medications, infection (CBC/diff, UA, pan culture, CXR), dehydration, malnutrition (check B1, B12; please replete thiamine)   Pt seen by NP/Neuro and later by MD. Note/plan to be edited by MD as needed.  [name], AGAC-NP [facility] Pager: [phone]  I have seen the patient and reviewed the above note.  She appears to be mildly delirious, etiology is currently unclear.  She did have a temperature elevation up to 100.5, though not the typical level thought of his fever, this is atypical.  Anything that could cause an inflammatory response to cause that temperature elevation I think could also cause someone in her baseline state to be confused.  Though not definite that  she has dementia based on history, she does not have very many highly cognitive demanding tasks, does not cook does not do significant miles of housework, does not manage any bills, etc.  Per report, she may be having some slight improvement, though she is still confused, I would continue conservative therapy, minimize deliriogenic medicines.  Given a question of history of seizures in the past, I do think a continuous EEG to rule out subclinical seizures as possible etiology.  Neurology will continue to follow  Shelby Slot, MD [facility] [phone]  If 7pm- 7am, please page neurology on call as listed in [facility]ION.

## 2021-01-12 DIAGNOSIS — I5032 Chronic diastolic (congestive) heart failure: Secondary | ICD-10-CM | POA: Diagnosis not present

## 2021-01-12 DIAGNOSIS — R4182 Altered mental status, unspecified: Secondary | ICD-10-CM | POA: Diagnosis not present

## 2021-01-12 DIAGNOSIS — Z955 Presence of coronary angioplasty implant and graft: Secondary | ICD-10-CM | POA: Diagnosis not present

## 2021-01-12 DIAGNOSIS — I10 Essential (primary) hypertension: Secondary | ICD-10-CM | POA: Diagnosis not present

## 2021-01-12 LAB — COMPREHENSIVE METABOLIC PANEL
ALT: 11 U/L (ref 0–44)
AST: 29 U/L (ref 15–41)
Albumin: 3.1 g/dL — ABNORMAL LOW (ref 3.5–5.0)
Alkaline Phosphatase: 46 U/L (ref 38–126)
Anion gap: 11 (ref 5–15)
BUN: 10 mg/dL (ref 8–23)
CO2: 24 mmol/L (ref 22–32)
Calcium: 8.9 mg/dL (ref 8.9–10.3)
Chloride: 104 mmol/L (ref 98–111)
Creatinine, Ser: 0.91 mg/dL (ref 0.44–1.00)
GFR, Estimated: >60 mL/min (ref >60)
Glucose, Bld: 99 mg/dL (ref 70–99)
Potassium: 4.1 mmol/L (ref 3.5–5.1)
Sodium: 139 mmol/L (ref 135–145)
Total Bilirubin: 0.8 mg/dL (ref 0.3–1.2)
Total Protein: 6.4 g/dL — ABNORMAL LOW (ref 6.5–8.1)

## 2021-01-12 LAB — CBC
HCT: 28.8 % — ABNORMAL LOW (ref 36.0–46.0)
Hemoglobin: 9.2 g/dL — ABNORMAL LOW (ref 12.0–15.0)
MCH: 31 pg (ref 26.0–34.0)
MCHC: 31.9 g/dL (ref 30.0–36.0)
MCV: 97 fL (ref 80.0–100.0)
Platelets: 151 10*3/uL (ref 150–400)
RBC: 2.97 MIL/uL — ABNORMAL LOW (ref 3.87–5.11)
RDW: 12 % (ref 11.5–15.5)
WBC: 6.8 10*3/uL (ref 4.0–10.5)
nRBC: 0 % (ref 0.0–0.2)

## 2021-01-12 LAB — HEMOGLOBIN A1C
Hgb A1c MFr Bld: 6.2 % — ABNORMAL HIGH (ref 4.8–5.6)
Mean Plasma Glucose: 131.24 mg/dL

## 2021-01-12 MED ORDER — DICLOFENAC SODIUM 1 % EX GEL
2.0000 g | Freq: Four times a day (QID) | CUTANEOUS | Status: DC
  Administered 2021-01-12 – 2021-01-16 (×15): 2 g via TOPICAL
  Filled 2021-01-12: qty 100

## 2021-01-12 MED ORDER — DIVALPROEX SODIUM 250 MG PO DR TAB
500.0000 mg | DELAYED_RELEASE_TABLET | Freq: Two times a day (BID) | ORAL | Status: DC
  Administered 2021-01-12 – 2021-01-16 (×8): 500 mg via ORAL
  Filled 2021-01-12 (×8): qty 2

## 2021-01-12 MED ORDER — VALPROATE SODIUM 100 MG/ML IV SOLN
1500.0000 mg | Freq: Once | INTRAVENOUS | Status: AC
  Administered 2021-01-12: 1500 mg via INTRAVENOUS
  Filled 2021-01-12: qty 15

## 2021-01-12 NOTE — Procedures (Signed)
Patient Name: [name]  MRN: [id number]  Epilepsy Attending: Charlsie Quest  Referring Physician/Provider: Dr Ritta Slot Duration: 01/11/2021 1126 to 01/12/2021 1126  Patient history: 87 year old female with history of seizures presented with altered mental status.  EEG to evaluate for seizures.  Level of alertness: Awake, asleep  AEDs during EEG study: Keppra  Technical aspects: This EEG study was done with scalp electrodes positioned according to the 10-20 International system of electrode placement. Electrical activity was acquired at a sampling rate of 500Hz  and reviewed with a high frequency filter of 70Hz  and a low frequency filter of 1Hz . EEG data were recorded continuously and digitally stored.   Description: The posterior dominant rhythm consists of 9 Hz activity of moderate voltage (25-35 uV) seen predominantly in posterior head regions, symmetric and reactive to eye opening and eye closing. Sleep was characterized by vertex waves, sleep spindles (12 to 14 Hz), maximal frontocentral region.  EEG showed intermittent generalized 3 to 6 Hz theta-delta slowing.  EEG also showed 2 to 3 Hz sharply contoured delta slowing in left frontal region which at times appeared rhythmic with some evolution in morphology.  Four seizures without clinical signs were recorded on 01/12/2021 at 0111, 0158, 0359 and 0713.  Concomitant EEG showed 5 to 6 Hz theta slowing admixed with sharp waves in left frontal region which gradually evolved in frequency to 2 to 3 Hz delta slowing and spread to involve right frontal region.  Average duration of seizures was about 45 seconds.   Patient event button was pressed on 01/11/2021 multiple times between 1518 to 1651 for unclear reasons.  Concomitant EEG before, during and after the event did not show any EEG changes to seizure.  ABNORMALITY -Focal seizure without clinical signs, left frontal region -Intermittent rhythmic delta activity, left frontal  region -Intermittent slow, generalized   IMPRESSION: This study showed four seizures without clinical signs arising from left frontal region, on 01/12/2021 at 0111, 0158, 0359 and 0713, average duration 45 seconds.  There is also evidence of cortical dysfunction in left frontal region where at times rhythmic slowing was noted which is on the ictal-interictal continuum with low potential for seizures.  Additionally there is evidence of mild diffuse encephalopathy, nonspecific etiology.  Dr. 0714 was notified.  Tabita Corbo 03/13/2021

## 2021-01-12 NOTE — Progress Notes (Signed)
Occupational Therapy Treatment Patient Details Name: [name] MRN: [id number] DOB: [birth year] Today's Date: 01/12/2021    History of present illness [name] is a 87 y.o. female PMHx:  for CAD s/p stent x2, chronic diastolic heart failure, hypertension, TIA, type 2 diabetes, history of seizure, CKD stage III, PMR, depression who presents with concerns of altered mental status. EEG negative for seizures and MRI brain negative for acute abnormality.   OT comments  Patient met lying supine in bed with daughter present at bedside. Patient much more alert and awake this date with ability to recall name/DOB, place, time and situation. Daughter present at bedside states patient near cognitive baseline. OT treatment session with focus on self-care re-education, ADL transfers, and BLE therapeutic exercise in prep for household mobility. Patient c/o pain in bilateral knees (chronic) 2/2 OA at rest and with activity. Patient progressed from supine to recliner with Mod to Max A to stand and cues for hand placement and Min A for stand-pivot transfer with use of RW. Patient also tolerates 1 set x 10 reps each of BLE HEP. Patient would benefit from continued acute OT services in prep for safe d/c to next level of care. Continued recommendation for SNF rehab pending patient progress.    Follow Up Recommendations  SNF;Supervision/Assistance - 24 hour    Equipment Recommendations  3 in 1 bedside commode    Recommendations for Other Services      Precautions / Restrictions Precautions Precautions: Fall Restrictions Weight Bearing Restrictions: No       Mobility Bed Mobility Overal bed mobility: Needs Assistance Bed Mobility: Supine to Sit     Supine to sit: Min assist     General bed mobility comments: Patient able to advance BLE toward EOB but requires Min A to elevate trunk.    Transfers Overall transfer level: Needs assistance Equipment used: Rolling walker (2  wheeled);None Transfers: Sit to/from [facility] Sit to Stand: Mod assist;Max assist Stand pivot transfers: Mod assist       General transfer comment: Mod A and Max A with fatigue for sit to stand from EOB x2 trials 2/2 pain in bilateral knees. Cues for hand placement and power negotiation.    Balance Overall balance assessment: Needs assistance Sitting-balance support: Bilateral upper extremity supported Sitting balance-Leahy Scale: Fair Sitting balance - Comments: Patient able to maintain static sitting balance at EOB without external assist. No overt LOB.   Standing balance support: Bilateral upper extremity supported Standing balance-Leahy Scale: Poor Standing balance comment: Reliant on BUE on RW.                           ADL either performed or assessed with clinical judgement   ADL                   Upper Body Dressing : Set up;Sitting   Lower Body Dressing: Moderate assistance;Cueing for safety;Sitting/lateral leans;Sit to/from stand   Toilet Transfer: Minimal assistance;Stand-pivot;RW [employer] forces technical officer Details (indicate cue type and reason): simualated to recliner         Functional mobility during ADLs: Minimal assistance;Rolling walker General ADL Comments: Patient continues to be limited by generalized weakness, decreased activity tolerance and bilateral knee pain.     Vision       Perception     Praxis      Cognition Arousal/Alertness: Awake/alert Behavior During Therapy: WFL for tasks assessed/performed Overall Cognitive Status: Impaired/Different from baseline Area  of Impairment: Memory                     Memory: Decreased short-term memory Following Commands: Follows multi-step commands with increased time       General Comments: Patient A&Ox4 this date. Daughter reports patient near cognitive baseline.        Exercises Exercises: General Lower Extremity General Exercises - Lower Extremity Ankle  Circles/Pumps: AROM;Both;10 reps;Seated Quad Sets: AROM;Both;10 reps;Seated Toe Raises: AROM;Both;10 reps;Seated Heel Raises: AROM;Both;10 reps;Seated   Shoulder Instructions       General Comments Patient on continous EEG. Daughter present at bedside.    Pertinent Vitals/ Pain       Pain Assessment: Faces Faces Pain Scale: Hurts little more Pain Location: Bilateral knees (chronic) Pain Descriptors / Indicators: Sore;Aching Pain Intervention(s): Monitored during session;Repositioned;Limited activity within patient's tolerance  Home Living                                          Prior Functioning/Environment              Frequency  Min 2X/week        Progress Toward Goals  OT Goals(current goals can now be found in the care plan section)  Progress towards OT goals: Progressing toward goals  Acute Rehab OT Goals Patient Stated Goal: To return home. OT Goal Formulation: With patient Time For Goal Achievement: 01/23/21 Potential to Achieve Goals: Good ADL Goals Pt Will Perform Grooming: with set-up;standing Pt Will Transfer to Toilet: with supervision;ambulating;bedside commode Pt/caregiver will Perform Home Exercise Program: Increased strength;Both right and left upper extremity;With written HEP provided;With theraband Additional ADL Goal #1: Pt will increase to minguardA for bed mobility as precursor for ADL Additional ADL Goal #2: Pt will tolerate x3 mins of OOB ADL tasks with 1 seated rest break to increase independence.  Plan Discharge plan remains appropriate;Frequency remains appropriate    Co-evaluation                 AM-PAC OT "6 Clicks" Daily Activity     Outcome Measure   Help from another person eating meals?: A Little Help from another person taking care of personal grooming?: A Little Help from another person toileting, which includes using toliet, bedpan, or urinal?: A Lot Help from another person bathing (including  washing, rinsing, drying)?: A Lot Help from another person to put on and taking off regular upper body clothing?: A Little Help from another person to put on and taking off regular lower body clothing?: A Lot 6 Click Score: 15    End of Session Equipment Utilized During Treatment: Gait belt;Rolling walker  OT Visit Diagnosis: Unsteadiness on feet (R26.81);Muscle weakness (generalized) (M62.81)   Activity Tolerance Patient tolerated treatment well   Patient Left in chair;with call bell/phone within reach;with chair alarm set;with family/visitor present   Nurse Communication Mobility status        Time: 1130-1153 OT Time Calculation (min): 23 min  Charges: OT General Charges $OT Visit: 1 Visit OT Treatments $Therapeutic Activity: 8-22 mins $Therapeutic Exercise: 8-22 mins  Chau Sawin H. OTR/L Supplemental OT, Department of rehab services [phone]   Montzerrat Brunell R H. 01/12/2021, 1:02 PM

## 2021-01-12 NOTE — Progress Notes (Signed)
PROGRESS NOTE        PATIENT DETAILS Name: [name] Age: 87 y.o. Sex: female Date of Birth: [birth year] Admit Date: 01/08/2021 Admitting Physician Azucena Fallen, MD RUE:[id number], [name], MD  Brief Narrative: Patient is a 87 y.o. female with history of CAD s/p PCI, chronic diastolic heart failure, TIA, DM-2, CKD stage IIIa, PMR, depression, seizure disorder-who presented with altered mental status in the setting of noncompliance with antiseizure medications-patient was thought to have had a breakthrough seizure with resultant postictal state/metabolic encephalopathy and subsequently admitted to the hospitalist service.  See below for further details.  Significant events: 2/27>> admit for concern of breakthrough seizures-postictal state  Significant studies: 2/27>> CT abdomen/pelvis: No acute abnormality 2/27>> CT head: No acute intracranial abnormality 2/27>> chest x-ray: No pneumonia 2/28>> MRI brain: No acute intracranial findings. 2/28>> EEG: No seizures 3/2>> LTM EEG-+ seizures (prelim)  Antimicrobial therapy: Rocephin: 2/27>> 3/2 Zithromax: 3/1>> 3/2  Microbiology data: 2/27>> urine culture: E. Coli-pansensitive 2/27>> blood culture: No growth 2/27>> Covid/influenza PCR: Negative  Procedures : None  Consults: Neurology  DVT Prophylaxis : enoxaparin (LOVENOX) injection 40 mg Start: 01/09/21 1000   Subjective: Awake/alert-denies any complaints.  Assessment/Plan: Possible breakthrough seizures: Apparently noncompliant to Keppra due to sedation/fatigue-due to unclear history of whether she actually had seizures-LTM EEG performed last night-Per neurology-positive for seizures-neurology starting Depakote.   Acute metabolic encephalopathy/postictal state: Better-completely awake and alert this morning.  May have some underlying cognitive dysfunction at baseline-maintain delirium precautions.  Possible aspiration pneumonia:  Clinically improved-afebrile-doubt requires any further antimicrobial therapy at this point-stop Rocephin/Zithromax on 3/2.  Asymptomatic bacteriuria: Urine culture positive-UA without significant bacteriuria-patient has no symptoms consistent with UTI.  Does not need antimicrobial therapy-has completed 3 days of IV Rocephin in any event.  History of CAD-s/p PCI: No anginal symptoms-continue aspirin  Chronic diastolic heart failure: Euvolemic on exam  HTN: BP slightly on the higher side but fluctuating-reasonable to continue with Coreg/amlodipine for a few more days before adjusting any further.  On the higher side-continue Coreg/amlodipine-if still elevated tomorrow-we will add another antihypertensive.  DM-2 (A1c 6.2 on 3/3): Continue close monitoring-does not need any specific treatment at this point.  Depression: Relatively stable-continue Celexa  Rheumatoid arthritis: Hold methotrexate for now  Diet: Diet Order            Diet Heart Room service appropriate? Yes; Fluid consistency: Thin  Diet effective now                  Code Status: Full code   Family Communication: Spoke with Daughter-Linda-[phone] at bedside on 3/3  Disposition Plan: Status is: Inpatient  Remains inpatient appropriate because:Inpatient level of care appropriate due to severity of illness   Dispo: The patient is from: Home              Anticipated d/c is to: SNF              Patient currently is not medically stable to d/c.   Difficult to place patient No  Barriers to Discharge: Await formal EEG reading-neurology planning on loading patient with IV Depakote today.  Possible discharge to SNF on 3/4  Antimicrobial agents: Anti-infectives (From admission, onward)   Start     Dose/Rate Route Frequency Ordered Stop   01/10/21 1400  azithromycin (ZITHROMAX) tablet 500 mg  Status:  Discontinued  500 mg Oral Daily 01/10/21 0927 01/11/21 0948   01/08/21 1815  cefTRIAXone (ROCEPHIN) 2 g in  sodium chloride 0.9 % 100 mL IVPB  Status:  Discontinued        2 g 200 mL/hr over 30 Minutes Intravenous Every 24 hours 01/08/21 1807 01/11/21 0948   01/08/21 1815  azithromycin (ZITHROMAX) 500 mg in sodium chloride 0.9 % 250 mL IVPB  Status:  Discontinued        500 mg 250 mL/hr over 60 Minutes Intravenous Every 24 hours 01/08/21 1807 01/10/21 0927       Time spent: 25 minutes-Greater than 50% of this time was spent in counseling, explanation of diagnosis, planning of further management, and coordination of care.  MEDICATIONS: Scheduled Meds: . amLODipine  10 mg Oral Daily  . aspirin EC  81 mg Oral Daily  . carvedilol  25 mg Oral BID WC  . citalopram  10 mg Oral Daily  . enoxaparin (LOVENOX) injection  40 mg Subcutaneous Daily  . pantoprazole  40 mg Oral Q1200  . thiamine  100 mg Oral Daily   Continuous Infusions:  PRN Meds:.acetaminophen, alum & mag hydroxide-simeth, HYDROmorphone (DILAUDID) injection   PHYSICAL EXAM: Vital signs: Vitals:   01/11/21 1941 01/11/21 2344 01/12/21 0316 01/12/21 0809  BP: (!) 146/51 (!) 159/56 (!) 144/76 (!) 157/61  Pulse: 71 74 79 72  Resp: 18 18 18 18   Temp: 99 F (37.2 C) 98.8 F (37.1 C) 97.7 F (36.5 C) 98.8 F (37.1 C)  TempSrc: Oral Oral Oral Oral  SpO2: 100% 97% 97% 100%  Weight:      Height:       Filed Weights   01/08/21 1756 01/08/21 2355  Weight: 76.3 kg 76.3 kg   Body mass index is 28.87 kg/m.   Gen Exam:Alert awake-not in any distress HEENT:atraumatic, normocephalic Chest: B/L clear to auscultation anteriorly CVS:S1S2 regular Abdomen:soft non tender, non distended Extremities:no edema Neurology: Non focal Skin: no rash  I have personally reviewed following labs and imaging studies  LABORATORY DATA: CBC: Recent Labs  Lab 01/08/21 1752 01/09/21 0312 01/10/21 0444 01/11/21 0345 01/12/21 0346  WBC 6.6 6.4 6.0 7.4 6.8  NEUTROABS 4.8  --   --   --   --   HGB 8.9* 8.7* 8.4* 9.3* 9.2*  HCT 28.8* 26.8*  25.8* 29.3* 28.8*  MCV 99.7 98.2 96.3 97.0 97.0  PLT 169 157 151 153 151    Basic Metabolic Panel: Recent Labs  Lab 01/08/21 1752 01/09/21 0312 01/10/21 0444 01/11/21 0345 01/12/21 0346  NA 135 139 138 138 139  K 4.1 4.2 3.2* 3.9 4.1  CL 105 108 106 105 104  CO2 19* 19* 21* 18* 24  GLUCOSE 108* 81 69* 95 99  BUN 19 17 9 12 10   CREATININE 1.00 0.94 0.86 0.91 0.91  CALCIUM 9.1 9.1 8.9 9.1 8.9    GFR: Estimated Creatinine Clearance: 45.2 mL/min (by C-G formula based on SCr of 0.91 mg/dL).  Liver Function Tests: Recent Labs  Lab 01/08/21 1752 01/10/21 0444 01/11/21 0345 01/12/21 0346  AST 25 27 33 29  ALT 11 11 12 11   ALKPHOS 58 48 53 46  BILITOT 1.2 1.4* 1.2 0.8  PROT 7.0 6.1* 6.5 6.4*  ALBUMIN 3.4* 3.0* 3.3* 3.1*   No results for input(s): LIPASE, AMYLASE in the last 168 hours. Recent Labs  Lab 01/08/21 1955  AMMONIA 17    Coagulation Profile: Recent Labs  Lab 01/08/21 1808  INR 1.2  Cardiac Enzymes: No results for input(s): CKTOTAL, CKMB, CKMBINDEX, TROPONINI in the last 168 hours.  BNP (last 3 results) No results for input(s): PROBNP in the last 8760 hours.  Lipid Profile: No results for input(s): CHOL, HDL, LDLCALC, TRIG, CHOLHDL, LDLDIRECT in the last 72 hours.  Thyroid Function Tests: Recent Labs    01/11/21 1222  TSH 2.020    Anemia Panel: Recent Labs    01/11/21 2107  VITAMINB12 2,087*    Urine analysis:    Component Value Date/Time   COLORURINE YELLOW 01/08/2021 1856   APPEARANCEUR CLEAR 01/08/2021 1856   LABSPEC 1.014 01/08/2021 1856   LABSPEC 1.020 04/23/2006 1313   PHURINE 6.0 01/08/2021 1856   GLUCOSEU NEGATIVE 01/08/2021 1856   GLUCOSEU NEGATIVE 02/28/2016 1559   HGBUR NEGATIVE 01/08/2021 1856   BILIRUBINUR NEGATIVE 01/08/2021 1856   BILIRUBINUR Negative 04/23/2006 1313   KETONESUR NEGATIVE 01/08/2021 1856   PROTEINUR 100 (A) 01/08/2021 1856   UROBILINOGEN 0.2 02/28/2016 1559   NITRITE NEGATIVE 01/08/2021 1856    LEUKOCYTESUR NEGATIVE 01/08/2021 1856   LEUKOCYTESUR Trace 04/23/2006 1313    Sepsis Labs: Lactic Acid, Venous    Component Value Date/Time   LATICACIDVEN 1.0 01/08/2021 1752    MICROBIOLOGY: Recent Results (from the past 240 hour(s))  Urine culture     Status: Abnormal   Collection Time: 01/08/21  6:56 PM   Specimen: Urine, Random  Result Value Ref Range Status   Specimen Description URINE, RANDOM  Final   Special Requests   Final    NONE Performed at [facility], [address], [place], [place] [address]    Culture 50,000 COLONIES/mL ESCHERICHIA COLI (A)  Final   Report Status 01/10/2021 FINAL  Final   Organism ID, Bacteria ESCHERICHIA COLI (A)  Final      Susceptibility   Escherichia coli - MIC*    AMPICILLIN 4 SENSITIVE Sensitive     CEFAZOLIN <=4 SENSITIVE Sensitive     CEFEPIME <=0.12 SENSITIVE Sensitive     CEFTRIAXONE <=0.25 SENSITIVE Sensitive     CIPROFLOXACIN <=0.25 SENSITIVE Sensitive     GENTAMICIN <=1 SENSITIVE Sensitive     IMIPENEM <=0.25 SENSITIVE Sensitive     NITROFURANTOIN <=16 SENSITIVE Sensitive     TRIMETH/SULFA <=20 SENSITIVE Sensitive     AMPICILLIN/SULBACTAM <=2 SENSITIVE Sensitive     PIP/TAZO <=4 SENSITIVE Sensitive     * 50,000 COLONIES/mL ESCHERICHIA COLI  Resp Panel by RT-PCR (Flu A&B, Covid) Urine, Clean Catch     Status: None   Collection Time: 01/08/21  6:56 PM   Specimen: Urine, Clean Catch; Nasopharyngeal(NP) swabs in vial transport medium  Result Value Ref Range Status   SARS Coronavirus 2 by RT PCR NEGATIVE NEGATIVE Final    Comment: (NOTE) SARS-CoV-2 target nucleic acids are NOT DETECTED.  The SARS-CoV-2 RNA is generally detectable in upper respiratory specimens during the acute phase of infection. The lowest concentration of SARS-CoV-2 viral copies this assay can detect is 138 copies/mL. A negative result does not preclude SARS-Cov-2 infection and should not be used as the sole basis for treatment or other patient  management decisions. A negative result may occur with  improper specimen collection/handling, submission of specimen other than nasopharyngeal swab, presence of viral mutation(s) within the areas targeted by this assay, and inadequate number of viral copies(<138 copies/mL). A negative result must be combined with clinical observations, patient history, and epidemiological information. The expected result is Negative.  Fact Sheet for Patients:  BloggerCourse.com  Fact Sheet  for Healthcare Providers:  SeriousBroker.it  This test is no t yet approved or cleared by the [place] and  has been authorized for detection and/or diagnosis of SARS-CoV-2 by FDA under an Emergency Use Authorization (EUA). This EUA will remain  in effect (meaning this test can be used) for the duration of the COVID-19 declaration under Section 564(b)(1) of the Act, 21 U.S.C.section 360bbb-3(b)(1), unless the authorization is terminated  or revoked sooner.       Influenza A by PCR NEGATIVE NEGATIVE Final   Influenza B by PCR NEGATIVE NEGATIVE Final    Comment: (NOTE) The Xpert Xpress SARS-CoV-2/FLU/RSV plus assay is intended as an aid in the diagnosis of influenza from Nasopharyngeal swab specimens and should not be used as a sole basis for treatment. Nasal washings and aspirates are unacceptable for Xpert Xpress SARS-CoV-2/FLU/RSV testing.  Fact Sheet for Patients: BloggerCourse.com  Fact Sheet for Healthcare Providers: SeriousBroker.it  This test is not yet approved or cleared by the [place] FDA and has been authorized for detection and/or diagnosis of SARS-CoV-2 by FDA under an Emergency Use Authorization (EUA). This EUA will remain in effect (meaning this test can be used) for the duration of the COVID-19 declaration under Section 564(b)(1) of the Act, 21 U.S.C. section 360bbb-3(b)(1),  unless the authorization is terminated or revoked.  Performed at [facility], [address], [place], [place] [address]   Blood Culture (routine x 2)     Status: None (Preliminary result)   Collection Time: 01/08/21  7:27 PM   Specimen: Site Not Specified; Blood  Result Value Ref Range Status   Specimen Description SITE NOT SPECIFIED  Final   Special Requests   Final    BOTTLES DRAWN AEROBIC AND ANAEROBIC Blood Culture adequate volume   Culture   Final    NO GROWTH 3 DAYS Performed at [facility], [address] [place], [place] [address]    Report Status PENDING  Incomplete  Blood Culture (routine x 2)     Status: None (Preliminary result)   Collection Time: 01/08/21  7:55 PM   Specimen: BLOOD RIGHT ARM  Result Value Ref Range Status   Specimen Description BLOOD RIGHT ARM UPPER  Final   Special Requests   Final    BOTTLES DRAWN AEROBIC AND ANAEROBIC Blood Culture adequate volume   Culture   Final    NO GROWTH 3 DAYS Performed at [facility], [address], [place], [place] [address]    Report Status PENDING  Incomplete  SARS CORONAVIRUS 2 (TAT 6-24 HRS) Nasopharyngeal Nasopharyngeal Swab     Status: None   Collection Time: 01/10/21  4:42 PM   Specimen: Nasopharyngeal Swab  Result Value Ref Range Status   SARS Coronavirus 2 NEGATIVE NEGATIVE Final    Comment: (NOTE) SARS-CoV-2 target nucleic acids are NOT DETECTED.  The SARS-CoV-2 RNA is generally detectable in upper and lower respiratory specimens during the acute phase of infection. Negative results do not preclude SARS-CoV-2 infection, do not rule out co-infections with other pathogens, and should not be used as the sole basis for treatment or other patient management decisions. Negative results must be combined with clinical observations, patient history, and epidemiological information. The expected result is Negative.  Fact Sheet for  Patients: HairSlick.no  Fact Sheet for Healthcare Providers: quierodirigir.com  This test is not yet approved or cleared by the [place] FDA and  has been authorized for detection and/or diagnosis of SARS-CoV-2 by  FDA under an Emergency Use Authorization (EUA). This EUA will remain  in effect (meaning this test can be used) for the duration of the COVID-19 declaration under Se ction 564(b)(1) of the Act, 21 U.S.C. section 360bbb-3(b)(1), unless the authorization is terminated or revoked sooner.  Performed at [facility], [address] [place], [place] [address]     RADIOLOGY STUDIES/RESULTS: No results found.   LOS: 2 days   Jeoffrey Massed, MD  [facility]    To contact the attending provider between 7A-7P or the covering provider during after hours 7P-7A, please log into the web site [email or web address] and access using universal [place] password for that web site. If you do not have the password, please call the hospital operator.  01/12/2021, 9:59 AM

## 2021-01-12 NOTE — Progress Notes (Signed)
Physical Therapy Treatment Patient Details Name: [name] MRN: [id number] DOB: [birth year] Today's Date: 01/12/2021    History of Present Illness [name] is a 87 y.o. female PMHx:  for CAD s/p stent x2, chronic diastolic heart failure, hypertension, TIA, type 2 diabetes, history of seizure, CKD stage III, PMR, depression who presents with concerns of altered mental status. EEG negative for seizures and MRI brain negative for acute abnormality.    PT Comments    Pt tolerated treatment well. Presented to PT with generalized weakness, deficits in memory, and increased time required for problem solving/motor planning. Pt still requiring physical assistance for bed mobility and transfers. Pt favors posterior lean in all transfers and standing activities requiring verbal and tactile cues to correct. Session limited to EOB due to continuous EEG. Pt can benefit from skilled PT to improve LE strength and decrease fall risk. PT recommends SNF to continue improving transfer and overall functional mobility.   Follow Up Recommendations  SNF;Supervision/Assistance - 24 hour     Equipment Recommendations  Wheelchair (measurements PT);Other (comment) (mechanical lift)    Recommendations for Other Services       Precautions / Restrictions Precautions Precautions: Fall Restrictions Weight Bearing Restrictions: No    Mobility  Bed Mobility Overal bed mobility: Needs Assistance Bed Mobility: Supine to Sit     Supine to sit: Min assist     General bed mobility comments: Patient able to advance BLE toward EOB but requires Min A to elevate trunk.    Transfers Overall transfer level: Needs assistance Equipment used: Rolling walker (2 wheeled);None Transfers: Sit to/from [employer] Sit to Stand: Mod assist;Max assist Stand pivot transfers: Mod assist       General transfer comment: Mod A and Max A with fatigue for sit to stand from EOB x2 trials 2/2 pain in  bilateral knees. Cues for hand placement and power negotiation.  Ambulation/Gait Ambulation/Gait assistance: Mod assist Gait Distance (Feet): 0 Feet (marching beside the bed) Assistive device: Rolling walker (2 wheeled) Gait Pattern/deviations: Leaning posteriorly         Stairs             Wheelchair Mobility    Modified Rankin (Stroke Patients Only)       Balance Overall balance assessment: Needs assistance Sitting-balance support: Bilateral upper extremity supported Sitting balance-Leahy Scale: Fair Sitting balance - Comments: Patient able to maintain static sitting balance at EOB without external assist. No overt LOB.   Standing balance support: Bilateral upper extremity supported Standing balance-Leahy Scale: Poor Standing balance comment: Reliant on BUE on RW.                            Cognition Arousal/Alertness: Awake/alert Behavior During Therapy: WFL for tasks assessed/performed Overall Cognitive Status: Impaired/Different from baseline Area of Impairment: Memory                     Memory: Decreased short-term memory Following Commands: Follows multi-step commands with increased time     Problem Solving: Slow processing;Requires verbal cues General Comments: Patient A&Ox4 this date. Daughter reports patient near cognitive baseline.      Exercises General Exercises - Lower Extremity Ankle Circles/Pumps: AROM;Both;10 reps;Seated Quad Sets: AROM;Both;10 reps;Seated Toe Raises: AROM;Both;10 reps;Seated Heel Raises: AROM;Both;10 reps;Seated    General Comments General comments (skin integrity, edema, etc.): Patient on continous EEG. Daughter present at bedside.      Pertinent Vitals/Pain Pain  Assessment: Faces Faces Pain Scale: Hurts little more Pain Location: Bilateral knees (chronic) Pain Descriptors / Indicators: Sore;Aching Pain Intervention(s): Monitored during session;Repositioned;Limited activity within patient's  tolerance    Home Living                      Prior Function            PT Goals (current goals can now be found in the care plan section) Acute Rehab PT Goals Patient Stated Goal: To return home. PT Goal Formulation: With patient Time For Goal Achievement: 01/26/21 Potential to Achieve Goals: Fair Progress towards PT goals: Progressing toward goals    Frequency    Min 2X/week      PT Plan Current plan remains appropriate    Co-evaluation              AM-PAC PT "6 Clicks" Mobility   Outcome Measure  Help needed turning from your back to your side while in a flat bed without using bedrails?: A Little Help needed moving from lying on your back to sitting on the side of a flat bed without using bedrails?: A Little Help needed moving to and from a bed to a chair (including a wheelchair)?: Total Help needed standing up from a chair using your arms (e.g., wheelchair or bedside chair)?: Total Help needed to walk in hospital room?: Total Help needed climbing 3-5 steps with a railing? : Total 6 Click Score: 10    End of Session Equipment Utilized During Treatment: Gait belt Activity Tolerance: Patient tolerated treatment well Patient left: in bed;with call bell/phone within reach;with bed alarm set;with family/visitor present (Daughter [name]) present, In view of EEG-monitoring camera) Nurse Communication: Mobility status PT Visit Diagnosis: Muscle weakness (generalized) (M62.81);Difficulty in walking, not elsewhere classified (R26.2)     Time: 1020-1059 PT Time Calculation (min) (ACUTE ONLY): 39 min  Charges:  $Therapeutic Activity: 23-37 mins                     Dyke Maes, SPT Pager #:[phone]   Ilona Sorrel 01/12/2021, 2:11 PM

## 2021-01-12 NOTE — Plan of Care (Signed)

## 2021-01-12 NOTE — Progress Notes (Signed)
Neurology Progress Note Subjective: No acute overnight events.  Episode of heartburn that woke patient from sleep documented from nurse yesterday with spontaneous resolution.  Patient examined early this morning, waking from sleep, remained drowsy during examination.  cEEG with evidence of multiple (4) left frontal region seizures 3/3 without clinical signs  Exam: Vitals:   01/11/21 2344 01/12/21 0316  BP: (!) 159/56 (!) 144/76  Pulse: 74 79  Resp: 18 18  Temp: 98.8 F (37.1 C) 97.7 F (36.5 C)  SpO2: 97% 97%   Gen: In bed, sleeping, in no acute distress.   Resp: non-labored breathing, no acute distress Abd: soft, non-distended, non-tender  Neuro: MS: Patient sleeping initially, wakes to voice. Alert to self. Initially states she is in a nursing home and self-corrects to state she is in the hospital. Unable to recall situation, unable to give a clear or coherent history of recent events leading to hospitalization. When asked the year, she states her age. With further questioning, she is able to state "1920" and self-corrects to 2022. States her year of birth correctly when asked her age but unable to state her age correctly. Speech is without aphasia. No neglect noted. She is mildly dysarthric but is edentulous.  CN: Pupils 4 mm/brisk, equal, round, reactive to light bilaterally, tracks examiner during eye opening but remains drowsy with eyes closed for most of the examination, face sensation intact and symmetric to light touch, face appears symmetric resting and smiling, hearing intact to voice, phonation normal, tongue protrudes midline, palate elevation symmetric.  Motor: 4/5 strength throughout without pronator drift. Lower extremity strength assessment limited by knee pain secondary to osteoarthritis.  Sensory: Intact and symmetric to light touch in upper and lower extremities. Gait: Deferred  Pertinent Labs: CBC    Component Value Date/Time   WBC 6.8 01/12/2021 0346   RBC 2.97  (L) 01/12/2021 0346   HGB 9.2 (L) 01/12/2021 0346   HGB 10.8 (L) 06/18/2006 0814   HCT 28.8 (L) 01/12/2021 0346   HCT 32.5 (L) 06/18/2006 0814   PLT 151 01/12/2021 0346   PLT 212 06/18/2006 0814   MCV 97.0 01/12/2021 0346   MCV 90.7 06/18/2006 0814   MCH 31.0 01/12/2021 0346   MCHC 31.9 01/12/2021 0346   RDW 12.0 01/12/2021 0346   RDW 14.4 06/18/2006 0814   LYMPHSABS 1.1 01/08/2021 1752   LYMPHSABS 1.1 06/18/2006 0814   MONOABS 0.6 01/08/2021 1752   MONOABS 0.5 06/18/2006 0814   EOSABS 0.0 01/08/2021 1752   EOSABS 0.1 06/18/2006 0814   BASOSABS 0.0 01/08/2021 1752   BASOSABS 0.0 06/18/2006 0814  CMP     Component Value Date/Time   NA 139 01/12/2021 0346   NA 134 (A) 04/25/2015 0000   K 4.1 01/12/2021 0346   CL 104 01/12/2021 0346   CO2 24 01/12/2021 0346   GLUCOSE 99 01/12/2021 0346   BUN 10 01/12/2021 0346   BUN 36 (A) 04/25/2015 0000   CREATININE 0.91 01/12/2021 0346   CALCIUM 8.9 01/12/2021 0346   PROT 6.4 (L) 01/12/2021 0346   ALBUMIN 3.1 (L) 01/12/2021 0346   AST 29 01/12/2021 0346   ALT 11 01/12/2021 0346   ALKPHOS 46 01/12/2021 0346   BILITOT 0.8 01/12/2021 0346   GFRNONAA >60 01/12/2021 0346   GFRAA 55 (L) 08/02/2020 1946   Lab Results  Component Value Date   TSH 2.020 01/11/2021   Results for orders placed or performed during the hospital encounter of 01/08/21  Urine culture  Status: Abnormal   Collection Time: 01/08/21  6:56 PM   Specimen: Urine, Random  Result Value Ref Range Status   Specimen Description URINE, RANDOM  Final   Special Requests   Final    NONE Performed at [facility], [address] [place], [place] [address]    Culture 50,000 COLONIES/mL ESCHERICHIA COLI (A)  Final   Report Status 01/10/2021 FINAL  Final   Organism ID, Bacteria ESCHERICHIA COLI (A)  Final      Susceptibility   Escherichia coli - MIC*    AMPICILLIN 4 SENSITIVE Sensitive     CEFAZOLIN <=4 SENSITIVE Sensitive     CEFEPIME <=0.12 SENSITIVE Sensitive      CEFTRIAXONE <=0.25 SENSITIVE Sensitive     CIPROFLOXACIN <=0.25 SENSITIVE Sensitive     GENTAMICIN <=1 SENSITIVE Sensitive     IMIPENEM <=0.25 SENSITIVE Sensitive     NITROFURANTOIN <=16 SENSITIVE Sensitive     TRIMETH/SULFA <=20 SENSITIVE Sensitive     AMPICILLIN/SULBACTAM <=2 SENSITIVE Sensitive     PIP/TAZO <=4 SENSITIVE Sensitive     * 50,000 COLONIES/mL ESCHERICHIA COLI  Resp Panel by RT-PCR (Flu A&B, Covid) Urine, Clean Catch     Status: None   Collection Time: 01/08/21  6:56 PM   Specimen: Urine, Clean Catch; Nasopharyngeal(NP) swabs in vial transport medium  Result Value Ref Range Status   SARS Coronavirus 2 by RT PCR NEGATIVE NEGATIVE Final    Comment: (NOTE) SARS-CoV-2 target nucleic acids are NOT DETECTED.  The SARS-CoV-2 RNA is generally detectable in upper respiratory specimens during the acute phase of infection. The lowest concentration of SARS-CoV-2 viral copies this assay can detect is 138 copies/mL. A negative result does not preclude SARS-Cov-2 infection and should not be used as the sole basis for treatment or other patient management decisions. A negative result may occur with  improper specimen collection/handling, submission of specimen other than nasopharyngeal swab, presence of viral mutation(s) within the areas targeted by this assay, and inadequate number of viral copies(<138 copies/mL). A negative result must be combined with clinical observations, patient history, and epidemiological information. The expected result is Negative.  Fact Sheet for Patients:  BloggerCourse.com  Fact Sheet for Healthcare Providers:  SeriousBroker.it  This test is no t yet approved or cleared by the [place] FDA and  has been authorized for detection and/or diagnosis of SARS-CoV-2 by FDA under an Emergency Use Authorization (EUA). This EUA will remain  in effect (meaning this test can be used) for the duration of  the COVID-19 declaration under Section 564(b)(1) of the Act, 21 U.S.C.section 360bbb-3(b)(1), unless the authorization is terminated  or revoked sooner.       Influenza A by PCR NEGATIVE NEGATIVE Final   Influenza B by PCR NEGATIVE NEGATIVE Final    Comment: (NOTE) The Xpert Xpress SARS-CoV-2/FLU/RSV plus assay is intended as an aid in the diagnosis of influenza from Nasopharyngeal swab specimens and should not be used as a sole basis for treatment. Nasal washings and aspirates are unacceptable for Xpert Xpress SARS-CoV-2/FLU/RSV testing.  Fact Sheet for Patients: BloggerCourse.com  Fact Sheet for Healthcare Providers: SeriousBroker.it  This test is not yet approved or cleared by the [place] FDA and has been authorized for detection and/or diagnosis of SARS-CoV-2 by FDA under an Emergency Use Authorization (EUA). This EUA will remain in effect (meaning this test can be used) for the duration of the COVID-19 declaration under Section 564(b)(1) of the Act, 21 U.S.C. section 360bbb-3(b)(1), unless the authorization  is terminated or revoked.  Performed at [facility], [address], [place], [place] [address]   Blood Culture (routine x 2)     Status: None (Preliminary result)   Collection Time: 01/08/21  7:27 PM   Specimen: Site Not Specified; Blood  Result Value Ref Range Status   Specimen Description SITE NOT SPECIFIED  Final   Special Requests   Final    BOTTLES DRAWN AEROBIC AND ANAEROBIC Blood Culture adequate volume   Culture   Final    NO GROWTH 3 DAYS Performed at [facility], [address] [place], [place] [address]    Report Status PENDING  Incomplete  Blood Culture (routine x 2)     Status: None (Preliminary result)   Collection Time: 01/08/21  7:55 PM   Specimen: BLOOD RIGHT ARM  Result Value Ref Range Status   Specimen Description BLOOD RIGHT ARM UPPER  Final   Special Requests   Final     BOTTLES DRAWN AEROBIC AND ANAEROBIC Blood Culture adequate volume   Culture   Final    NO GROWTH 3 DAYS Performed at [facility], [address] [place], [place] [address]    Report Status PENDING  Incomplete  SARS CORONAVIRUS 2 (TAT 6-24 HRS) Nasopharyngeal Nasopharyngeal Swab     Status: None   Collection Time: 01/10/21  4:42 PM   Specimen: Nasopharyngeal Swab  Result Value Ref Range Status   SARS Coronavirus 2 NEGATIVE NEGATIVE Final    Comment: (NOTE) SARS-CoV-2 target nucleic acids are NOT DETECTED.  The SARS-CoV-2 RNA is generally detectable in upper and lower respiratory specimens during the acute phase of infection. Negative results do not preclude SARS-CoV-2 infection, do not rule out co-infections with other pathogens, and should not be used as the sole basis for treatment or other patient management decisions. Negative results must be combined with clinical observations, patient history, and epidemiological information. The expected result is Negative.  Fact Sheet for Patients: HairSlick.no  Fact Sheet for Healthcare Providers: quierodirigir.com  This test is not yet approved or cleared by the [place] FDA and  has been authorized for detection and/or diagnosis of SARS-CoV-2 by FDA under an Emergency Use Authorization (EUA). This EUA will remain  in effect (meaning this test can be used) for the duration of the COVID-19 declaration under Se ction 564(b)(1) of the Act, 21 U.S.C. section 360bbb-3(b)(1), unless the authorization is terminated or revoked sooner.  Performed at [facility], [address], [place], [place] [address]    Lab Results  Component Value Date   VITAMINB12 2,087 (H) 01/11/2021   Imaging: CT-scan of the brain No acute intracranial abnormality.  MRI examination of the brain No acute or reversible finding. 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 Mild small  vessel change of the hemispheric white matter, less than often seen at this age.  EEG: Focal seizure without clinical signs, left frontal region x 4 on overnight EEG monitoring  Assessment: 87 year old female who presents for evaluation of acute encephalopathy and visual hallucinations, appears delirious on examination. - Brain imaging CT/MRI without evidence of acute or reversible cause of encephalopathy.  - Labs significant for urine microbiology with presence of E. Coli, blood cultures without growth, anemia with hemoglobin of 8.9, CBC without leukocytosis.  - DDx includes toxic metabolic encephalopathy, delirium, seizure with post-ictal state, or ischemia/infarct. With negative imaging, ischemic etiology is of low suspicion. Likely multifactorial encephalopathy.  - cEEg with evidence of seizure activity  3/3.  Focal seizure without clinical signs, left frontal region x 4. Keppra discontinued, Depakote 1,500 mg load with maintenance dose of 500 mg BID initiated for management of seizure activity and possible delirium - Does not currently have diagnosed dementia based on history but she does not have very many highly cognitive demanding tasks, does not cook does not do significant miles of housework, does not manage any bills, etc. Further evaluation of cognitive decline outpatient would be appropriate.   Recommendations: - cEEG with seizure activity without clinical signs, Depakote load 1,500 mg followed by 500 mg Depakote BID - Discontinue Keppra home dose of 500 mg daily - Continue holding sedating/deliriogenic medications as much as possible: benzodiazepines, anticholinergics, diphenhydramine, antihistamines, narcotics, Ambien/Lunesta/Sonata etc. Keppra may also be contributing to delirium. - Management of metabolic derangements and urine microbiology with E. Coli per primary team   Lanae Boast, AGACNP-BC [facility] [phone]  I have seen the patient reviewed the above  note.   She appears clinically fairly similar on my exam, with some confusion, states her age instead of the year when I asked her the year.  EEG actually reveals multiple seizures which seem to be increasing in frequency.  Given the increased frequency, I would favor using some benzodiazepine to try to calm them down.   I will also load with Depakote, but favor this over increasing Keppra given that she has had problems with sedation with Keppra in the past.  Unfortunately, due to the sedation Keppra was recently lowered, which I think was very appropriate at the time, but may be contributing to her increased seizure frequency now.  We will continue continuous EEG monitoring and adjusting antiepileptics based on that.  Ritta Slot, MD [facility] [phone]  If 7pm- 7am, please page neurology on call as listed in AMION.

## 2021-01-12 NOTE — Progress Notes (Signed)
EEG maint complete. No skin breakdown at FP1 FP2 F7 continue to monitor

## 2021-01-13 DIAGNOSIS — I10 Essential (primary) hypertension: Secondary | ICD-10-CM | POA: Diagnosis not present

## 2021-01-13 DIAGNOSIS — I5032 Chronic diastolic (congestive) heart failure: Secondary | ICD-10-CM | POA: Diagnosis not present

## 2021-01-13 DIAGNOSIS — R4182 Altered mental status, unspecified: Secondary | ICD-10-CM | POA: Diagnosis not present

## 2021-01-13 DIAGNOSIS — Z955 Presence of coronary angioplasty implant and graft: Secondary | ICD-10-CM | POA: Diagnosis not present

## 2021-01-13 LAB — CULTURE, BLOOD (ROUTINE X 2)
Culture: NO GROWTH
Culture: NO GROWTH
Special Requests: ADEQUATE
Special Requests: ADEQUATE

## 2021-01-13 LAB — LEVETIRACETAM LEVEL: Levetiracetam Lvl: <1 ug/mL — ABNORMAL LOW (ref 10.0–40.0)

## 2021-01-13 NOTE — Progress Notes (Signed)
Neurology Progress Note  S: No overnight events. States she feels fine this am. No gross seizure activity per RN. Says her neck is hurting. (recurrent complaint)  O: Current vital signs: BP (!) 141/52 (BP Location: Right Arm)   Pulse 69   Temp 98.1 F (36.7 C) (Oral)   Resp 18   Ht 5\' 4"  (1.626 m)   Wt 76.3 kg   SpO2 100%   BMI 28.87 kg/m  Vital signs in last 24 hours: Temp:  [98.1 F (36.7 C)-99.1 F (37.3 C)] 98.1 F (36.7 C) (03/04 1146) Pulse Rate:  [69-78] 69 (03/04 1146) Resp:  [16-18] 18 (03/04 1146) BP: (129-158)/(52-70) 141/52 (03/04 1146) SpO2:  [99 %-100 %] 100 % (03/04 1146)  GENERAL: Awake, alert in NAD HEENT: Normocephalic and atraumatic LUNGS: Normal respiratory effort.  CV: RRR  Ext: warm   NEURO:  Mental Status: Alert. Oriented to name, place, city, state but not to month, day or date  Speech/Language: speech is without aphasia or dysarthria.  Naming, repetition, fluency, and comprehension intact but slowed.  Cranial Nerves:  II: PERRL. Visual fields full.  III, IV, VI: EOMI. Eyelids elevate symmetrically.  V: Sensation is intact to light touch and symmetrical to face.  VII: Smile is symmetrical.  VIII: hearing intact to voice. IX, X: Palate elevates symmetrically. Phonation is normal.  07-26-1985 shrug 5/5. XII: tongue is midline without fasciculations. Motor: grip and triceps 5/5, biceps 4/5. LE strength 4/5 . Sensation- Intact to light touch bilaterally. Extinction absent to light touch to DSS.     Medications  Current Facility-Administered Medications:  .  acetaminophen (TYLENOL) tablet 650 mg, 650 mg, Oral, Q6H PRN, Chotiner, [id number], MD, 650 mg at 01/13/21 1008 .  alum & mag hydroxide-simeth (MAALOX/MYLANTA) 200-200-20 MG/5ML suspension 30 mL, 30 mL, Oral, Q6H PRN, 03/15/21, MD, 30 mL at 01/11/21 1427 .  amLODipine (NORVASC) tablet 10 mg, 10 mg, Oral, Daily, Tu, Ching T, DO, 10 mg at 01/13/21 1007 .  aspirin EC tablet 81 mg,  81 mg, Oral, Daily, Tu, Ching T, DO, 81 mg at 01/13/21 1007 .  carvedilol (COREG) tablet 25 mg, 25 mg, Oral, BID WC, Tu, Ching T, DO, 25 mg at 01/13/21 0804 .  citalopram (CELEXA) tablet 10 mg, 10 mg, Oral, Daily, Tu, Ching T, DO, 10 mg at 01/13/21 1008 .  diclofenac Sodium (VOLTAREN) 1 % topical gel 2 g, 2 g, Topical, QID, Ghimire, 03/15/21, MD, 2 g at 01/13/21 1329 .  [COMPLETED] valproate (DEPACON) 1,500 mg in dextrose 5 % 50 mL IVPB, 1,500 mg, Intravenous, Once, Stopped at 01/12/21 1313 **FOLLOWED BY** divalproex (DEPAKOTE) DR tablet 500 mg, 500 mg, Oral, Q12H, Toberman, Stevi W, NP, 500 mg at 01/13/21 1007 .  enoxaparin (LOVENOX) injection 40 mg, 40 mg, Subcutaneous, Daily, Tu, Ching T, DO, 40 mg at 01/13/21 1007 .  HYDROmorphone (DILAUDID) injection 0.5 mg, 0.5 mg, Intravenous, Q3H PRN, Chotiner, 03/15/21, MD, 0.5 mg at 01/09/21 0127 .  pantoprazole (PROTONIX) EC tablet 40 mg, 40 mg, Oral, Q1200, Ghimire, 01/11/21, MD, 40 mg at 01/13/21 1007 .  thiamine tablet 100 mg, 100 mg, Oral, Daily, 03/15/21, MD, 100 mg at 01/13/21 1008  EEG x 2.  #1. This study is suggestive of cortical dysfunction in left frontal region as well asmild diffuse encephalopathy, nonspecific etiology.  No seizures or definite epileptiform discharges were seen during the study.  # 2. This study showed four seizures without clinical signs arising from  left frontal region, on 01/12/2021 at 0111, 0158, 0359 and 0713, average duration 45 seconds.  There is also evidence of cortical dysfunction in left frontal region where at times rhythmic slowing was noted which is on the ictal-interictal continuum with low potential for seizures.  Additionally there is evidence of mild diffuse encephalopathy, nonspecific etiology.  Assessment: 87 yo female with known history of seizures with recent non adherence to medication regimen and neurology f/up. She was admitted 4 days ago with acute metabolic encephalopathy and  history of seizures. Neurology was consulted on 01/11/21 and patient exhibited delirium and encephalopathy. Her home Keppra was continued at that time. Continuous EEG was started to rule out subclinical seizures. EEG #1 was negative, but EEG #2 showed 4 seizures. Keppra was discontinued at that point in favor of Depakote. Depakote was loaded and mantenance dosing started. She remains on cEEG today to monitor for further subclinical seizures.   Plan: 1. Continue cEEG and will f/up today's results.  2. Continue Depakote and adjust as needed based on EEG.  3. Continue to hold sedating medications as to prevent delirium and this does seem to have improved since first exam.    Pt seen by Jimmye Norman, MSN, APN-BC/Nurse Practitioner/Neuro and later by MD. Note and plan to be edited as needed by MD.  Pager: [phone]  I have seen the patient and reviewed the above note.  Nursing reports no hallucinations this morning.  She responded very well to Depakote.  As long as she does not have any further seizures, we can discontinue the EEG tomorrow.  Neurological continue to follow.  Ritta Slot, MD [facility] [phone]  If 7pm- 7am, please page neurology on call as listed in AMION.

## 2021-01-13 NOTE — Progress Notes (Signed)
LTM maint complete - no skin breakdown under: fp1,a1,f7

## 2021-01-13 NOTE — Progress Notes (Signed)
LTM maint complete - no skin breakdown under:   Fp1 Fp2 F3 A1 A1 serviced

## 2021-01-13 NOTE — Progress Notes (Signed)
LTM maint complete - all leads securely attached.

## 2021-01-13 NOTE — Consult Note (Signed)
   [facility] Inpatient Consult   01/13/2021  ROMELL CAVANAH 08/08/1933 [id number]  [place] [facility] [ACO] Patient: [employer]   Patient screened for history with  [facility] Management service and to check for needs for post hospital transition.  Review of patient's medical record reveals patient is currently being recommended for a skilled nursing facility level of care.  Plan:  Continue to follow progress and disposition to assess for post hospital care management needs.  If patient transitions to a skilled facility then patient needs are to be met at a skilled nursing facility level of care for rehab.  For questions contact:   Natividad Brood, RN BSN [facility] Liaison  [phone] business mobile phone Toll free office [phone]  Fax number: [phone] [email or web address]

## 2021-01-13 NOTE — Care Management Important Message (Signed)
Important Message  Patient Details  Name: [name] MRN: [id number] Date of Birth: Nov 29, 1932   Medicare Important Message Given:  Yes     Dorena Bodo 01/13/2021, 2:49 PM

## 2021-01-13 NOTE — Procedures (Addendum)
Patient Name:[name] AQT:[id number] Epilepsy Attending:Rexton Greulich Annabelle Harman Referring Physician/Provider:Dr Ritta Slot Duration:01/12/2021 1126 to 01/13/2021 1126  Patient history:87 year old female with history of seizures presented with altered mental status. EEG to evaluate for seizures.  Level of alertness:Awake, asleep  AEDs during EEG study:Keppra, vpa  Technical aspects: This EEG study was done with scalp electrodes positioned according to the 10-20 International system of electrode placement. Electrical activity was acquired at a sampling rate of 500Hz  and reviewed with a high frequency filter of 70Hz  and a low frequency filter of 1Hz . EEG data were recorded continuously and digitally stored.   Description: The posterior dominant rhythm consists of 9 Hz activity of moderate voltage (25-35 uV) seen predominantly in posterior head regions, symmetric and reactive to eye opening and eye closing.Sleep was characterized by vertex waves, sleep spindles (12 to 14 Hz), maximal frontocentral region. EEG showed intermittent generalized and maximal left frontal 3 to 6 Hz theta-delta slowing.  Sharp transients were also seen in left frontal region.  ABNORMALITY -Intermittent slow, generalized and maximal left frontal region  IMPRESSION: This study  is suggestive of cortical dysfunction in left frontal region as well as mild diffuse encephalopathy, nonspecific etiology.  No seizures or definite epileptiform discharges were seen during the study.  [name] 

## 2021-01-13 NOTE — Telephone Encounter (Signed)
I attempted to renew her prescriptions.  She is currently in the hospital, however.  Thanks

## 2021-01-13 NOTE — TOC Progression Note (Signed)
Transition of Care [facility]) - Progression Note    Patient Details  Name: KIYLA RINGLER MRN: [id number] Date of Birth: [birth year]  [facility] CM/SW Contact  Baldemar Lenis, [place] Phone Number: 01/13/2021, 10:14 AM  Clinical Narrative:   CSW notified by MD that plan is possible DC to SNF over the weekend, if cleared by neurology. CSW updated [name], who will have to get a new insurance authorization as approval expires today. CSW sent therapy notes from yesterday to them to send to [facility]. CSW confirmed that [name] will be able to admit patient over the weekend, if stable. CSW to follow.    Expected Discharge Plan: Skilled Nursing Facility Barriers to Discharge: Continued Medical Work up,Insurance Authorization  Expected Discharge Plan and Services Expected Discharge Plan: Skilled Nursing Facility     Post Acute Care Choice: Skilled Nursing Facility Living arrangements for the past 2 months: Single Family Home                                       Social Determinants of Health (SDOH) Interventions    Readmission Risk Interventions Readmission Risk Prevention Plan 02/08/2020  Transportation Screening Complete  Medication Review [facility]) Complete  PCP or Specialist appointment within 3-5 days of discharge Complete  HRI or Home Care Consult Complete  SW Recovery Care/Counseling Consult Complete  Palliative Care Screening Not Applicable  Skilled Nursing Facility Complete  Some recent data might be hidden

## 2021-01-13 NOTE — Progress Notes (Signed)
PROGRESS NOTE        PATIENT DETAILS Name: [name] Age: 87 y.o. Sex: female Date of Birth: Jul 08, 1933 Admit Date: 01/08/2021 Admitting Physician Azucena Fallen, MD [id number], [name], MD  Brief Narrative: Patient is a 87 y.o. female with history of CAD s/p PCI, chronic diastolic heart failure, TIA, DM-2, CKD stage IIIa, PMR, depression, seizure disorder-who presented with altered mental status in the setting of noncompliance with antiseizure medications-patient was thought to have had a breakthrough seizure with resultant postictal state/metabolic encephalopathy and subsequently admitted to the hospitalist service.  See below for further details.  Significant events: 2/27>> admit for concern of breakthrough seizures-postictal state  Significant studies: 2/27>> CT abdomen/pelvis: No acute abnormality 2/27>> CT head: No acute intracranial abnormality 2/27>> chest x-ray: No pneumonia 2/28>> MRI brain: No acute intracranial findings. 2/28>> EEG: No seizures 3/2-3/3>> LTM EEG: four seizures without clinical signs arising from left frontal region 3/3-3/4>> LTM EEG: Cortical dysfunction left frontal region-no seizures  Antimicrobial therapy: Rocephin: 2/27>> 3/2 Zithromax: 3/1>> 3/2  Microbiology data: 2/27>> urine culture: E. Coli-pansensitive 2/27>> blood culture: No growth 2/27>> Covid/influenza PCR: Negative  Procedures : None  Consults: Neurology  DVT Prophylaxis : enoxaparin (LOVENOX) injection 40 mg Start: 01/09/21 1000   Subjective: Awake/alert-denies any complaints.  Assessment/Plan: Breakthrough seizures:  Apparently noncompliant to Keppra due to sedation/fatigue-LTM EEG positive for seizures-neurology following-and optimizing antiseizure medications.  Currently on Depakote.  Discussed with neurology-recommendations are for patient to remain hospitalized for another 24 hours before discharge to SNF.  Acute metabolic  encephalopathy/postictal state: Awake/alert this morning.  Maintain delirium precautions.    Possible aspiration pneumonia: Clinically improved-afebrile-doubt requires any further antimicrobial therapy at this point-stop Rocephin/Zithromax on 3/2.  Asymptomatic bacteriuria: Urine culture positive-UA without significant bacteriuria-patient has no symptoms consistent with UTI.  Does not need antimicrobial therapy-has completed 3 days of IV Rocephin in any event.  History of CAD-s/p PCI: No anginal symptoms-continue aspirin  Chronic diastolic heart failure: Euvolemic on exam  HTN: BP relatively stable-continue Coreg and amlodipine.  Follow and optimize.    DM-2 (A1c 6.2 on 3/3): Continue close monitoring-does not need any specific treatment at this point.  Depression: Relatively stable-continue Celexa  Rheumatoid arthritis: Hold methotrexate for now  Diet: Diet Order            Diet Heart Room service appropriate? Yes; Fluid consistency: Thin  Diet effective now                  Code Status: Full code   Family Communication: Spoke with Daughter-Linda-[phone] over the phone on 3/4  Disposition Plan: Status is: Inpatient  Remains inpatient appropriate because:Inpatient level of care appropriate due to severity of illness   Dispo: The patient is from: Home              Anticipated d/c is to: SNF              Patient currently is not medically stable to d/c.   Difficult to place patient No  Barriers to Discharge: Await formal EEG reading-neurology planning on loading patient with IV Depakote today.  Possible discharge to SNF on 3/4  Antimicrobial agents: Anti-infectives (From admission, onward)   Start     Dose/Rate Route Frequency Ordered Stop   01/10/21 1400  azithromycin (ZITHROMAX) tablet 500 mg  Status:  Discontinued  500 mg Oral Daily 01/10/21 0927 01/11/21 0948   01/08/21 1815  cefTRIAXone (ROCEPHIN) 2 g in sodium chloride 0.9 % 100 mL IVPB  Status:   Discontinued        2 g 200 mL/hr over 30 Minutes Intravenous Every 24 hours 01/08/21 1807 01/11/21 0948   01/08/21 1815  azithromycin (ZITHROMAX) 500 mg in sodium chloride 0.9 % 250 mL IVPB  Status:  Discontinued        500 mg 250 mL/hr over 60 Minutes Intravenous Every 24 hours 01/08/21 1807 01/10/21 0927       Time spent: 25 minutes-Greater than 50% of this time was spent in counseling, explanation of diagnosis, planning of further management, and coordination of care.  MEDICATIONS: Scheduled Meds: . amLODipine  10 mg Oral Daily  . aspirin EC  81 mg Oral Daily  . carvedilol  25 mg Oral BID WC  . citalopram  10 mg Oral Daily  . diclofenac Sodium  2 g Topical QID  . divalproex  500 mg Oral Q12H  . enoxaparin (LOVENOX) injection  40 mg Subcutaneous Daily  . pantoprazole  40 mg Oral Q1200  . thiamine  100 mg Oral Daily   Continuous Infusions:  PRN Meds:.acetaminophen, alum & mag hydroxide-simeth, HYDROmorphone (DILAUDID) injection   PHYSICAL EXAM: Vital signs: Vitals:   01/12/21 1940 01/12/21 2327 01/13/21 0325 01/13/21 0800  BP: (!) 142/56 (!) 141/60 (!) 150/54 (!) 158/64  Pulse: 75 71 75 78  Resp: Temp: 98.9 F (37.2 C) 99 F (37.2 C) 99 F (37.2 C) 98.7 F (37.1 C)  TempSrc: Oral Oral Oral Oral  SpO2: 99% 99% 99%   Weight:      Height:       Filed Weights   01/08/21 1756 01/08/21 2355  Weight: 76.3 kg 76.3 kg   Body mass index is 28.87 kg/m.   Gen Exam:Alert awake-not in any distress HEENT:atraumatic, normocephalic Chest: B/L clear to auscultation anteriorly CVS:S1S2 regular Abdomen:soft non tender, non distended Extremities:no edema Neurology: Non focal Skin: no rash  I have personally reviewed following labs and imaging studies  LABORATORY DATA: CBC: Recent Labs  Lab 01/08/21 1752 01/09/21 0312 01/10/21 0444 01/11/21 0345 01/12/21 0346  WBC 6.6 6.4 6.0 7.4 6.8  NEUTROABS 4.8  --   --   --   --   HGB 8.9* 8.7* 8.4* 9.3* 9.2*   HCT 28.8* 26.8* 25.8* 29.3* 28.8*  MCV 99.7 98.2 96.3 97.0 97.0  PLT 169 157 151 153 151    Basic Metabolic Panel: Recent Labs  Lab 01/08/21 1752 01/09/21 0312 01/10/21 0444 01/11/21 0345 01/12/21 0346  NA 135 139 138 138 139  K 4.1 4.2 3.2* 3.9 4.1  CL 105 108 106 105 104  CO2 19* 19* 21* 18* 24  GLUCOSE 108* 81 69* 95 99  BUN CREATININE 1.00 0.94 0.86 0.91 0.91  CALCIUM 9.1 9.1 8.9 9.1 8.9    GFR: Estimated Creatinine Clearance: 45.2 mL/min (by C-G formula based on SCr of 0.91 mg/dL).  Liver Function Tests: Recent Labs  Lab 01/08/21 1752 01/10/21 0444 01/11/21 0345 01/12/21 0346  AST 25 27 33 29  ALT ALKPHOS 58 48 53 46  BILITOT 1.2 1.4* 1.2 0.8  PROT 7.0 6.1* 6.5 6.4*  ALBUMIN 3.4* 3.0* 3.3* 3.1*   No results for input(s): LIPASE, AMYLASE in the last 168 hours. Recent Labs  Lab 01/08/21 1955  AMMONIA 17    Coagulation Profile: Recent Labs  Lab 01/08/21 1808  INR 1.2    Cardiac Enzymes: No results for input(s): CKTOTAL, CKMB, CKMBINDEX, TROPONINI in the last 168 hours.  BNP (last 3 results) No results for input(s): PROBNP in the last 8760 hours.  Lipid Profile: No results for input(s): CHOL, HDL, LDLCALC, TRIG, CHOLHDL, LDLDIRECT in the last 72 hours.  Thyroid Function Tests: Recent Labs    01/11/21 1222  TSH 2.020    Anemia Panel: Recent Labs    01/11/21 2107  VITAMINB12 2,087*    Urine analysis:    Component Value Date/Time   COLORURINE YELLOW 01/08/2021 1856   APPEARANCEUR CLEAR 01/08/2021 1856   LABSPEC 1.014 01/08/2021 1856   LABSPEC 1.020 04/23/2006 1313   PHURINE 6.0 01/08/2021 1856   GLUCOSEU NEGATIVE 01/08/2021 1856   GLUCOSEU NEGATIVE 02/28/2016 1559   HGBUR NEGATIVE 01/08/2021 1856   BILIRUBINUR NEGATIVE 01/08/2021 1856   BILIRUBINUR Negative 04/23/2006 1313   KETONESUR NEGATIVE 01/08/2021 1856   PROTEINUR 100 (A) 01/08/2021 1856   UROBILINOGEN 0.2 02/28/2016 1559   NITRITE NEGATIVE  01/08/2021 1856   LEUKOCYTESUR NEGATIVE 01/08/2021 1856   LEUKOCYTESUR Trace 04/23/2006 1313    Sepsis Labs: Lactic Acid, Venous    Component Value Date/Time   LATICACIDVEN 1.0 01/08/2021 1752    MICROBIOLOGY: Recent Results (from the past 240 hour(s))  Urine culture     Status: Abnormal   Collection Time: 01/08/21  6:56 PM   Specimen: Urine, Random  Result Value Ref Range Status   Specimen Description URINE, RANDOM  Final   Special Requests   Final    NONE Performed at [facility], [address], [place], [place] [address]    Culture 50,000 COLONIES/mL ESCHERICHIA COLI (A)  Final   Report Status 01/10/2021 FINAL  Final   Organism ID, Bacteria ESCHERICHIA COLI (A)  Final      Susceptibility   Escherichia coli - MIC*    AMPICILLIN 4 SENSITIVE Sensitive     CEFAZOLIN <=4 SENSITIVE Sensitive     CEFEPIME <=0.12 SENSITIVE Sensitive     CEFTRIAXONE <=0.25 SENSITIVE Sensitive     CIPROFLOXACIN <=0.25 SENSITIVE Sensitive     GENTAMICIN <=1 SENSITIVE Sensitive     IMIPENEM <=0.25 SENSITIVE Sensitive     NITROFURANTOIN <=16 SENSITIVE Sensitive     TRIMETH/SULFA <=20 SENSITIVE Sensitive     AMPICILLIN/SULBACTAM <=2 SENSITIVE Sensitive     PIP/TAZO <=4 SENSITIVE Sensitive     * 50,000 COLONIES/mL ESCHERICHIA COLI  Resp Panel by RT-PCR (Flu A&B, Covid) Urine, Clean Catch     Status: None   Collection Time: 01/08/21  6:56 PM   Specimen: Urine, Clean Catch; Nasopharyngeal(NP) swabs in vial transport medium  Result Value Ref Range Status   SARS Coronavirus 2 by RT PCR NEGATIVE NEGATIVE Final    Comment: (NOTE) SARS-CoV-2 target nucleic acids are NOT DETECTED.  The SARS-CoV-2 RNA is generally detectable in upper respiratory specimens during the acute phase of infection. The lowest concentration of SARS-CoV-2 viral copies this assay can detect is 138 copies/mL. A negative result does not preclude SARS-Cov-2 infection and should not be used as the sole basis for treatment  or other patient management decisions. A negative result may occur with  improper specimen collection/handling, submission of specimen other than nasopharyngeal swab, presence of viral mutation(s) within the areas targeted by this assay, and inadequate number of viral copies(<138 copies/mL). A negative result must be combined with clinical observations, patient  history, and epidemiological information. The expected result is Negative.  Fact Sheet for Patients:  BloggerCourse.com  Fact Sheet for Healthcare Providers:  SeriousBroker.it  This test is no t yet approved or cleared by the [place] FDA and  has been authorized for detection and/or diagnosis of SARS-CoV-2 by FDA under an Emergency Use Authorization (EUA). This EUA will remain  in effect (meaning this test can be used) for the duration of the COVID-19 declaration under Section 564(b)(1) of the Act, 21 U.S.C.section 360bbb-3(b)(1), unless the authorization is terminated  or revoked sooner.       Influenza A by PCR NEGATIVE NEGATIVE Final   Influenza B by PCR NEGATIVE NEGATIVE Final    Comment: (NOTE) The Xpert Xpress SARS-CoV-2/FLU/RSV plus assay is intended as an aid in the diagnosis of influenza from Nasopharyngeal swab specimens and should not be used as a sole basis for treatment. Nasal washings and aspirates are unacceptable for Xpert Xpress SARS-CoV-2/FLU/RSV testing.  Fact Sheet for Patients: BloggerCourse.com  Fact Sheet for Healthcare Providers: SeriousBroker.it  This test is not yet approved or cleared by the [place] FDA and has been authorized for detection and/or diagnosis of SARS-CoV-2 by FDA under an Emergency Use Authorization (EUA). This EUA will remain in effect (meaning this test can be used) for the duration of the COVID-19 declaration under Section 564(b)(1) of the Act, 21 U.S.C. section  360bbb-3(b)(1), unless the authorization is terminated or revoked.  Performed at [facility], [address] [place], [place] [address]   Blood Culture (routine x 2)     Status: None   Collection Time: 01/08/21  7:27 PM   Specimen: Site Not Specified; Blood  Result Value Ref Range Status   Specimen Description SITE NOT SPECIFIED  Final   Special Requests   Final    BOTTLES DRAWN AEROBIC AND ANAEROBIC Blood Culture adequate volume   Culture   Final    NO GROWTH 5 DAYS Performed at [facility], [address] [place], [place] [address]    Report Status 01/13/2021 FINAL  Final  Blood Culture (routine x 2)     Status: None   Collection Time: 01/08/21  7:55 PM   Specimen: BLOOD RIGHT ARM  Result Value Ref Range Status   Specimen Description BLOOD RIGHT ARM UPPER  Final   Special Requests   Final    BOTTLES DRAWN AEROBIC AND ANAEROBIC Blood Culture adequate volume   Culture   Final    NO GROWTH 5 DAYS Performed at [facility], [address] [place], [place] [address]    Report Status 01/13/2021 FINAL  Final  SARS CORONAVIRUS 2 (TAT 6-24 HRS) Nasopharyngeal Nasopharyngeal Swab     Status: None   Collection Time: 01/10/21  4:42 PM   Specimen: Nasopharyngeal Swab  Result Value Ref Range Status   SARS Coronavirus 2 NEGATIVE NEGATIVE Final    Comment: (NOTE) SARS-CoV-2 target nucleic acids are NOT DETECTED.  The SARS-CoV-2 RNA is generally detectable in upper and lower respiratory specimens during the acute phase of infection. Negative results do not preclude SARS-CoV-2 infection, do not rule out co-infections with other pathogens, and should not be used as the sole basis for treatment or other patient management decisions. Negative results must be combined with clinical observations, patient history, and epidemiological information. The expected result is Negative.  Fact Sheet for Patients: HairSlick.no  Fact Sheet for  Healthcare Providers: quierodirigir.com  This test is not yet approved or cleared  by the [place] and  has been authorized for detection and/or diagnosis of SARS-CoV-2 by FDA under an Emergency Use Authorization (EUA). This EUA will remain  in effect (meaning this test can be used) for the duration of the COVID-19 declaration under Se ction 564(b)(1) of the Act, 21 U.S.C. section 360bbb-3(b)(1), unless the authorization is terminated or revoked sooner.  Performed at [facility], [address] [place], [place] [address]     RADIOLOGY STUDIES/RESULTS: Overnight EEG with video  Result Date: 01/12/2021 Charlsie Quest, MD     01/13/2021  9:26 AM Patient Name: [name] MRN: [id number] Epilepsy Attending: Charlsie Quest Referring Physician/Provider: Dr Ritta Slot Duration: 01/11/2021 1126 to 01/12/2021 1126  Patient history: 87 year old female with history of seizures presented with altered mental status.  EEG to evaluate for seizures.  Level of alertness: Awake, asleep  AEDs during EEG study: Keppra  Technical aspects: This EEG study was done with scalp electrodes positioned according to the 10-20 International system of electrode placement. Electrical activity was acquired at a sampling rate of 500Hz  and reviewed with a high frequency filter of 70Hz  and a low frequency filter of 1Hz . EEG data were recorded continuously and digitally stored.  Description: The posterior dominant rhythm consists of 9 Hz activity of moderate voltage (25-35 uV) seen predominantly in posterior head regions, symmetric and reactive to eye opening and eye closing. Sleep was characterized by vertex waves, sleep spindles (12 to 14 Hz), maximal frontocentral region.  EEG showed intermittent generalized 3 to 6 Hz theta-delta slowing.  EEG also showed 2 to 3 Hz sharply contoured delta slowing in left frontal region which at times appeared rhythmic with some evolution in  morphology. Four seizures without clinical signs were recorded on 01/12/2021 at 0111, 0158, 0359 and 0713.  Concomitant EEG showed 5 to 6 Hz theta slowing admixed with sharp waves in left frontal region which gradually evolved in frequency to 2 to 3 Hz delta slowing and spread to involve right frontal region.  Average duration of seizures was about 45 seconds. Patient event button was pressed on 01/11/2021 multiple times between 1518 to 1651 for unclear reasons.  Concomitant EEG before, during and after the event did not show any EEG changes to seizure.  ABNORMALITY -Focal seizure without clinical signs, left frontal region -Intermittent rhythmic delta activity, left frontal region -Intermittent slow, generalized  IMPRESSION: This study showed four seizures without clinical signs arising from left frontal region, on 01/12/2021 at 0111, 0158, 0359 and 0713, average duration 45 seconds.  There is also evidence of cortical dysfunction in left frontal region where at times rhythmic slowing was noted which is on the ictal-interictal continuum with low potential for seizures.  Additionally there is evidence of mild diffuse encephalopathy, nonspecific etiology. Dr. 0714 was notified. Priyanka 03/13/2021     LOS: 3 days   03/14/2021, MD  [facility]    To contact the attending provider between 7A-7P or the covering provider during after hours 7P-7A, please log into the web site [email or web address] and access using universal [place] password for that web site. If you do not have the password, please call the hospital operator.  01/13/2021, 11:19 AM

## 2021-01-14 DIAGNOSIS — R4182 Altered mental status, unspecified: Secondary | ICD-10-CM | POA: Diagnosis not present

## 2021-01-14 DIAGNOSIS — E1121 Type 2 diabetes mellitus with diabetic nephropathy: Secondary | ICD-10-CM

## 2021-01-14 DIAGNOSIS — Z87898 Personal history of other specified conditions: Secondary | ICD-10-CM | POA: Diagnosis not present

## 2021-01-14 LAB — SARS CORONAVIRUS 2 (TAT 6-24 HRS): SARS Coronavirus 2: NEGATIVE

## 2021-01-14 MED ORDER — ACETAMINOPHEN 325 MG PO TABS: 650.0000 mg | ORAL_TABLET | Freq: Four times a day (QID) | ORAL | Status: DC | PRN

## 2021-01-14 MED ORDER — DIVALPROEX SODIUM 500 MG PO DR TAB: 500.0000 mg | DELAYED_RELEASE_TABLET | Freq: Two times a day (BID) | ORAL | Status: DC

## 2021-01-14 MED ORDER — THIAMINE HCL 100 MG PO TABS: 100.0000 mg | ORAL_TABLET | Freq: Every day | ORAL | Status: AC

## 2021-01-14 NOTE — Procedures (Addendum)
Patient Name:[name] OFB:[id number] Epilepsy Attending:Fardowsa Authier Annabelle Harman Referring Physician/Provider:DrMcNeill Amada Jupiter Duration:01/13/2021 1126 to 3/5/20220837  Patient history:87 year old female with history of seizures presented with altered mental status. EEG to evaluate for seizures.  Level of alertness:Awake, asleep  AEDs during EEG study: vpa  Technical aspects: This EEG study was done with scalp electrodes positioned according to the 10-20 International system of electrode placement. Electrical activity was acquired at a sampling rate of 500Hz  and reviewed with a high frequency filter of 70Hz  and a low frequency filter of 1Hz . EEG data were recorded continuously and digitally stored.   Description: The posterior dominant rhythm consists of 9 Hz activity of moderate voltage (25-35 uV) seen predominantly in posterior head regions, symmetric and reactive to eye opening and eye closing.Sleep was characterized by vertex waves, sleep spindles (12 to 14 Hz), maximal frontocentral region. EEG showed intermittent generalized  3 to 6 Hz theta-delta slowing.    ABNORMALITY -Intermittent slow, generalized   IMPRESSION: This study is suggestive of mild diffuse encephalopathy, nonspecific etiology.  No seizures or definite epileptiform discharges were seen during the study.  [name] 

## 2021-01-14 NOTE — Progress Notes (Signed)
EEG unhooked, No skin breakdown seen.

## 2021-01-14 NOTE — Discharge Summary (Signed)
PATIENT DETAILS Name: [name] Age: 87 y.o. Sex: female Date of Birth: 05/15/1933 MRN: [id number]. Admitting Physician: Azucena Fallen, MD [name]:[id number], [name], MD  Admit Date: 01/08/2021 Discharge date: 01/14/2021  Recommendations for Outpatient Follow-up:  1. Follow up with PCP in 1-2 weeks 2. Please obtain CMP/CBC in one week 3. Please ensure follow-up with outpatient neurology  Admitted From:  Home  Disposition: SNF   Home Health: No  Equipment/Devices: None  Discharge Condition: Stable  CODE STATUS: FULL CODE  Diet recommendation:  Diet Order            Diet - low sodium heart healthy           Diet Carb Modified           Diet Heart Room service appropriate? Yes; Fluid consistency: Thin  Diet effective now                 Brief Narrative: Patient is a 87 y.o. female with history of CAD s/p PCI, chronic diastolic heart failure, TIA, DM-2, CKD stage IIIa, PMR, depression, seizure disorder-who presented with altered mental status in the setting of noncompliance with antiseizure medications-patient was thought to have had a breakthrough seizure with resultant postictal state/metabolic encephalopathy and subsequently admitted to the hospitalist service.  See below for further details.  Significant events: 2/27>> admit for concern of breakthrough seizures-postictal state  Significant studies: 2/27>> CT abdomen/pelvis: No acute abnormality 2/27>> CT head: No acute intracranial abnormality 2/27>> chest x-ray: No pneumonia 2/28>> MRI brain: No acute intracranial findings. 2/28>> EEG: No seizures 3/2-3/3>> LTM NFA:OZHYQMVHQION without clinical signs arising from left frontal region 3/3-3/4>> LTM EEG: Cortical dysfunction left frontal region-no seizures 3/4-3/5>> LTM EEG: No seizures-mild diffuse encephalopathy  Antimicrobial therapy: Rocephin: 2/27>> 3/2 Zithromax: 3/1>> 3/2  Microbiology data: 2/27>> urine culture: E.  Coli-pansensitive 2/27>> blood culture: No growth 2/27>> Covid/influenza PCR: Negative  Procedures : None  Consults: Neurology  Brief Hospital Course: Breakthrough seizures:  Apparently noncompliant to Keppra due to sedation/fatigue-LTM EEG positive for seizures-neurology following closely-no longer on Keppra-has been started on Depakote.  EEG as above-has been seizure-free for almost 48 hours-spoke with neurology-Dr. Amada Jupiter today-okay for discharge to SNF.  Please ensure follow-up with neurology in the outpatient setting.  Seizure precautions discussed with patient on the morning of discharge (instructions below).  Acute metabolic encephalopathy/postictal state: Awake/alert this morning.  Maintain delirium precautions.    Possible aspiration pneumonia: Clinically improved-afebrile-doubt requires any further antimicrobial therapy at this point-stop Rocephin/Zithromax on 3/2.  Asymptomatic bacteriuria: Urine culture positive-UA without significant bacteriuria-patient has no symptoms consistent with UTI.  Does not need antimicrobial therapy-has completed 3 days of IV Rocephin in any event.  History of CAD-s/p PCI: No anginal symptoms-continue aspirin  Chronic diastolic heart failure: Euvolemic on exam  HTN: BP relatively stable-continue Coreg and amlodipine.  Follow and optimize.    DM-2 (A1c 6.2 on 3/3): Continue close monitoring-does not need any specific treatment at this point.  Depression: Relatively stable-continue Celexa  Rheumatoid arthritis: Resume methotrexate on discharge   Discharge Diagnoses:  Active Problems:   Coronary atherosclerosis   Essential hypertension   AMS (altered mental status)   History of seizure   S/P primary angioplasty with coronary stent   Chronic diastolic CHF (congestive heart failure) [facility])   Discharge Instructions:  Activity:  As tolerated   Discharge Instructions    Ambulatory referral to Neurology   Complete by: As  directed    An appointment is requested in  approximately: 2 weeks   Diet - low sodium heart healthy   Complete by: As directed    Diet Carb Modified   Complete by: As directed    Discharge instructions   Complete by: As directed    Seizure precautions: Per [facility] statutes, patients with seizures are not allowed to drive until they have been seizure-free for six months and cleared by a physician   Use caution when using heavy equipment or power tools. Avoid working on ladders or at heights. Take showers instead of baths. Ensure the water temperature is not too high on the home water heater. Do not go swimming alone. Do not lock yourself in a room alone (i.e. bathroom). When caring for infants or small children, sit down when holding, feeding, or changing them to minimize risk of injury to the child in the event you have a seizure. Maintain good sleep hygiene. Avoid alcohol.   If patienthas another seizure, call 911 and bring them back to the ED if: A. The seizure lasts longer than 5 minutes.  B. The patient doesn't wake shortly after the seizure or has new problems such as difficulty seeing, speaking or moving following the seizure C. The patient was injured during the seizure D. The patient has a temperature over 102 F (39C) E. The patient vomited during the seizure and now is having trouble breathing  During the Seizure  - First, ensure adequate ventilation and place patients on the floor on their left side  Loosen clothing around the neck and ensure the airway is patent. If the patient is clenching the teeth, do not force the mouth open with any object as this can cause severe damage - Remove all items from the surrounding that can be hazardous. The patient may be oblivious to what's happening and may not even know what he or she is doing. If the patient is confused and wandering, either gently guide him/her away and block access to outside areas - Reassure the  individual and be comforting - Call 911. In most cases, the seizure ends before EMS arrives. However, there are cases when seizures may last over 3 to 5 minutes. Or the individual may have developed breathing difficulties or severe injuries. If a pregnant patient or a person with diabetes develops a seizure, it is prudent to call an ambulance. - Finally, if the patient does not regain full consciousness, then call EMS. Most patients will remain confused for about 45 to 90 minutes after a seizure, so you must use judgment in calling for help. - Avoid restraints but make sure the patient is in a bed with padded side rails - Place the individual in a lateral position with the neck slightly flexed; this will help the saliva drain from the mouth and prevent the tongue from falling backward - Remove all nearby furniture and other hazards from the area - Provide verbal assurance as the individual is regaining consciousness - Provide the patient with privacy if possible - Call for help and start treatment as ordered by the caregiver  After the Seizure (Postictal Stage)  After a seizure, most patients experience confusion, fatigue, muscle pain and/or a headache. Thus, one should permit the individual to sleep. For the next few days, reassurance is essential. Being calm and helping reorient the person is also of importance.  Most seizures are painless and end spontaneously. Seizures are not harmful to others but can lead to complications such as stress on the lungs, brain and the heart.  Individuals with prior lung problems may develop labored breathing and respiratory distress.   Follow with Primary MD  Plotnikov, [name], MD in 1-2 weeks  Follow with neurology in 2 weeks  Follow with your primary rheumatologist-in 2-4 weeks  Please get a complete blood count and chemistry panel checked by your Primary MD at your next visit, and again as instructed by your Primary MD.  Get Medicines reviewed and  adjusted: Please take all your medications with you for your next visit with your Primary MD  Laboratory/radiological data: Please request your Primary MD to go over all hospital tests and procedure/radiological results at the follow up, please ask your Primary MD to get all Hospital records sent to his/her office.  In some cases, they will be blood work, cultures and biopsy results pending at the time of your discharge. Please request that your primary care M.D. follows up on these results.  Also Note the following: If you experience worsening of your admission symptoms, develop shortness of breath, life threatening emergency, suicidal or homicidal thoughts you must seek medical attention immediately by calling 911 or calling your MD immediately  if symptoms less severe.  You must read complete instructions/literature along with all the possible adverse reactions/side effects for all the Medicines you take and that have been prescribed to you. Take any new Medicines after you have completely understood and accpet all the possible adverse reactions/side effects.   Do not drive when taking Pain medications or sleeping medications (Benzodaizepines)  Do not take more than prescribed Pain, Sleep and Anxiety Medications. It is not advisable to combine anxiety,sleep and pain medications without talking with your primary care practitioner  Special Instructions: If you have smoked or chewed Tobacco  in the last 2 yrs please stop smoking, stop any regular Alcohol  and or any Recreational drug use.  Wear Seat belts while driving.  Please note: You were cared for by a hospitalist during your hospital stay. Once you are discharged, your primary care physician will handle any further medical issues. Please note that NO REFILLS for any discharge medications will be authorized once you are discharged, as it is imperative that you return to your primary care physician (or establish a relationship with a  primary care physician if you do not have one) for your post hospital discharge needs so that they can reassess your need for medications and monitor your lab values.   Increase activity slowly   Complete by: As directed      Allergies as of 01/14/2021      Reactions   Ativan [lorazepam] Other (See Comments)   Very confused   Tramadol Hcl Anxiety, Rash   Headache   Amlodipine Besylate Other (See Comments)    dizzy   Atenolol Other (See Comments)   Fatigue   Benazepril Cough   Benicar [olmesartan Medoxomil] Other (See Comments)   HEADACHE   Cozaar Nausea And Vomiting   Hydralazine Other (See Comments)   Hair loss   Hydrochlorothiazide W-triamterene Other (See Comments)    dizzy   Hydrocodone Other (See Comments)   HEADACHE   Hydroxyzine Pamoate Other (See Comments)   Per MAR   Iodine Other (See Comments)   Per MAR   Lisinopril Other (See Comments), Cough   Tired & fatigue   Peach Flavor Itching   Penicillins Itching   Has patient had a PCN reaction causing immediate rash, facial/tongue/throat swelling, SOB or lightheadedness with hypotension: No Has patient had a PCN reaction causing severe  rash involving mucus membranes or skin necrosis: No Has patient had a PCN reaction that required hospitalization No Has patient had a PCN reaction occurring within the last 10 years: Yes If all of the above answers are "NO", then may proceed with Cephalosporin use. tolerates cephalosporins OK   Pravastatin Other (See Comments)   Myalgias-muscle pain   Prednisolone Nausea Only, Other (See Comments)   Upset stomach   Strawberry Flavor Itching   Tramadol Hcl Other (See Comments)   headache   Benadryl [diphenhydramine] Itching, Palpitations      Medication List    STOP taking these medications   acetaminophen 650 MG CR tablet Commonly known as: TYLENOL Replaced by: acetaminophen 325 MG tablet   B-D SINGLE USE SWABS REGULAR Pads   doxycycline 100 MG tablet Commonly known as:  VIBRA-TABS   levETIRAcetam 500 MG tablet Commonly known as: KEPPRA   meclizine 25 MG tablet Commonly known as: ANTIVERT   nitroGLYCERIN 0.4 MG SL tablet Commonly known as: NITROSTAT   ondansetron 4 MG tablet Commonly known as: ZOFRAN   silver sulfADIAZINE 1 % cream Commonly known as: Silvadene     TAKE these medications   Accu-Chek Aviva Soln Use as directed What changed:   how much to take  how to take this  when to take this  additional instructions   acetaminophen 325 MG tablet Commonly known as: TYLENOL Take 2 tablets (650 mg total) by mouth every 6 (six) hours as needed for mild pain, headache or moderate pain. Replaces: acetaminophen 650 MG CR tablet   amLODipine 10 MG tablet Commonly known as: NORVASC Take 1 tablet (10 mg total) by mouth daily.   aspirin EC 81 MG tablet Take 1 tablet (81 mg total) by mouth daily. Swallow whole.   BIOFREEZE EX Apply 1 application topically 2 (two) times daily as needed (pain).   brimonidine 0.2 % ophthalmic solution Commonly known as: ALPHAGAN Place 1 drop into both eyes 2 (two) times daily.   carvedilol 25 MG tablet Commonly known as: COREG Take 1 tablet (25 mg total) by mouth 2 (two) times daily with a meal.   cholecalciferol 25 MCG (1000 UNIT) tablet Commonly known as: VITAMIN D Take 1,000 Units by mouth daily.   citalopram 10 MG tablet Commonly known as: CeleXA Take 1 tablet (10 mg total) by mouth daily.   divalproex 500 MG DR tablet Commonly known as: DEPAKOTE Take 1 tablet (500 mg total) by mouth every 12 (twelve) hours.   feeding supplement (GLUCERNA SHAKE) Liqd Take 237 mLs by mouth 3 (three) times daily between meals. What changed: when to take this   glucose blood test strip Commonly known as: OneTouch Verio Use as instructed What changed:   how much to take  how to take this  when to take this  additional instructions   methotrexate 2.5 MG tablet Take 10 mg by mouth once a week. For  arthritis   OneTouch Delica Lancets Fine Misc 1 Device by Does not apply route daily as needed. What changed: reasons to take this   pantoprazole 40 MG tablet Commonly known as: Protonix Take 1 tablet (40 mg total) by mouth daily.   rosuvastatin 20 MG tablet Commonly known as: CRESTOR Take 1 tablet (20 mg total) by mouth daily.   thiamine 100 MG tablet Take 1 tablet (100 mg total) by mouth daily.   Voltaren 1 % Gel Generic drug: diclofenac Sodium Apply 1 application topically 2 (two) times daily as needed (pain).  Contact information for follow-up providers    [facility]. Schedule an appointment as soon as possible for a visit in 2 week(s).   Contact information: [address]     [address] [place] [address] [phone]       Casimer Lanius, MD. Schedule an appointment as soon as possible for a visit in 2 week(s).   Specialty: Rheumatology Contact information: [address] [place] [address] [phone]        Plotnikov, [name], MD. Schedule an appointment as soon as possible for a visit in 2 week(s).   Specialty: Internal Medicine Contact information: [address] [place] [address] [phone]        Rollene Rotunda, MD .   Specialty: Cardiology Contact information: [address] [place] [address] [phone]            Contact information for after-discharge care    Destination    [name] .   Service: Skilled Nursing Contact information: [address] [place] [address] [phone]                 Allergies  Allergen Reactions  . Ativan [Lorazepam] Other (See Comments)    Very confused  . Tramadol Hcl Anxiety and Rash    Headache  . Amlodipine Besylate Other (See Comments)     dizzy  . Atenolol Other (See Comments)    Fatigue  . Benazepril Cough  . Benicar [Olmesartan Medoxomil] Other (See  Comments)    HEADACHE  . Cozaar Nausea And Vomiting  . Hydralazine Other (See Comments)    Hair loss  . Hydrochlorothiazide W-Triamterene Other (See Comments)     dizzy  . Hydrocodone Other (See Comments)    HEADACHE  . Hydroxyzine Pamoate Other (See Comments)    Per MAR  . Iodine Other (See Comments)    Per MAR  . Lisinopril Other (See Comments) and Cough    Tired & fatigue  . Peach Flavor Itching  . Penicillins Itching    Has patient had a PCN reaction causing immediate rash, facial/tongue/throat swelling, SOB or lightheadedness with hypotension: No Has patient had a PCN reaction causing severe rash involving mucus membranes or skin necrosis: No Has patient had a PCN reaction that required hospitalization No Has patient had a PCN reaction occurring within the last 10 years: Yes If all of the above answers are "NO", then may proceed with Cephalosporin use.  tolerates cephalosporins OK   . Pravastatin Other (See Comments)    Myalgias-muscle pain  . Prednisolone Nausea Only and Other (See Comments)    Upset stomach  . Strawberry Flavor Itching  . Tramadol Hcl Other (See Comments)    headache  . Benadryl [Diphenhydramine] Itching and Palpitations    Other Procedures/Studies: CT ABDOMEN PELVIS WO CONTRAST  Result Date: 01/08/2021 CLINICAL DATA:  Acute generalized abdominal pain. EXAM: CT ABDOMEN AND PELVIS WITHOUT CONTRAST TECHNIQUE: Multidetector CT imaging of the abdomen and pelvis was performed following the standard protocol without IV contrast. COMPARISON:  May 10, 2015. FINDINGS: Lower chest: No acute abnormality. Hepatobiliary: No focal liver abnormality is seen. Status post cholecystectomy. No biliary dilatation. Pancreas: Unremarkable. No pancreatic ductal dilatation or surrounding inflammatory changes. Spleen: Normal in size without focal abnormality. Adrenals/Urinary Tract: Adrenal glands are unremarkable. Kidneys are normal, without renal calculi, focal lesion, or  hydronephrosis. Bladder is unremarkable. Stomach/Bowel: Stomach appears normal. There is no evidence of bowel obstruction or inflammation.  Vascular/Lymphatic: Aortic atherosclerosis. No enlarged abdominal or pelvic lymph nodes. Reproductive: Status post hysterectomy. No adnexal masses. Other: No abdominal wall hernia or abnormality. No abdominopelvic ascites. Musculoskeletal: No acute or significant osseous findings. IMPRESSION: Aortic atherosclerosis. No acute abnormality seen in the abdomen or pelvis. Aortic Atherosclerosis (ICD10-I70.0). Electronically Signed   By: Lupita Raider M.D.   On: 01/08/2021 21:23   CT HEAD WO CONTRAST  Result Date: 01/08/2021 CLINICAL DATA:  Mental status change.  Unknown. EXAM: CT HEAD WITHOUT CONTRAST TECHNIQUE: Contiguous axial images were obtained from the base of the skull through the vertex without intravenous contrast. COMPARISON:  CT head 02/01/2002, MR head 07/17/2016 FINDINGS: Brain: Cerebral ventricle sizes are concordant with the degree of cerebral volume loss. Patchy and confluent areas of decreased attenuation are noted throughout the deep and periventricular white matter of the cerebral hemispheres bilaterally, compatible with chronic microvascular ischemic disease. No evidence of large-territorial acute infarction. No parenchymal hemorrhage. No mass lesion. No extra-axial collection. No mass effect or midline shift. No hydrocephalus. Basilar cisterns are patent. No Similar-appearing extended sella that appears partially empty. Vascular: No hyperdense vessel. Atherosclerotic calcifications are present within the cavernous internal carotid arteries. Skull: No acute fracture or focal lesion. Sinuses/Orbits: Paranasal sinuses and mastoid air cells are clear. The orbits are unremarkable. Other: None. IMPRESSION: No acute intracranial abnormality. Electronically Signed   By: Tish Frederickson M.D.   On: 01/08/2021 19:42   MR BRAIN W WO CONTRAST  Result Date:  01/09/2021 CLINICAL DATA:  Mental status changes of unknown cause. Vascular disease. EXAM: MRI HEAD WITHOUT AND WITH CONTRAST TECHNIQUE: Multiplanar, multiecho pulse sequences of the brain and surrounding structures were obtained without and with intravenous contrast. CONTRAST:  7.5 cc Gadavist COMPARISON:  Head CT earlier tonight.  MRI 07/17/2016. FINDINGS: Brain: Diffusion imaging does not show any acute or subacute infarction. The brainstem is normal. There are several old small vessel cerebellar infarctions. Cerebral hemispheres show mild for age chronic small-vessel ischemic changes of the white matter. No cortical or large vessel territory infarction. No mass lesion, hemorrhage, hydrocephalus or extra-axial collection. After contrast administration, no abnormal enhancement occurs. Vascular: Major vessels at the base of the brain show flow. Skull and upper cervical spine: Negative Sinuses/Orbits: Clear/normal.  Previous lens implant on the left. Other: None IMPRESSION: No acute or reversible finding. 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 Mild small vessel change of the hemispheric white matter, less than often seen at this age. Electronically Signed   By: Paulina Fusi M.D.   On: 01/09/2021 03:05   DG Chest Port 1 View  Result Date: 01/08/2021 CLINICAL DATA:  Altered mental status. EXAM: PORTABLE CHEST 1 VIEW COMPARISON:  August 02, 2020. FINDINGS: Stable cardiomegaly. No pneumothorax or pleural effusion is noted. Right lung is clear. Minimal left lingular subsegmental atelectasis or scarring is noted. Bony thorax is unremarkable. IMPRESSION: Minimal left lingular subsegmental atelectasis or scarring. Aortic Atherosclerosis (ICD10-I70.0). Electronically Signed   By: Lupita Raider M.D.   On: 01/08/2021 18:11   EEG adult  Result Date: 01/09/2021 Charlsie Quest, MD     01/09/2021  9:49 AM Patient Name: HOLDEN DRAUGHON MRN: [id number] Epilepsy Attending: Charlsie Quest Referring  Physician/Provider: Dr [name] Gutter Date: 01/09/2021 Duration: 33.25 mins Patient history: 87 year old female with history of seizures presented with altered mental status.  EEG to evaluate for seizures. Level of alertness: Awake, asleep AEDs during EEG study: Keppra Technical aspects: This EEG study was done with scalp electrodes positioned according to  the 10-20 International system of electrode placement. Electrical activity was acquired at a sampling rate of 500Hz  and reviewed with a high frequency filter of 70Hz  and a low frequency filter of 1Hz . EEG data were recorded continuously and digitally stored. Description: The posterior dominant rhythm consists of 9 Hz activity of moderate voltage (25-35 uV) seen predominantly in posterior head regions, symmetric and reactive to eye opening and eye closing. Sleep was characterized by vertex waves, sleep spindles (12 to 14 Hz), maximal frontocentral region.  EEG showed intermittent generalized 3 to 6 Hz theta-delta slowing. Physiologic photic driving was not seen during photic stimulation.  Hyperventilation was not performed.   ABNORMALITY -Intermittent slow, generalized IMPRESSION: This study is suggestive of mild diffuse encephalopathy, nonspecific etiology. No seizures or epileptiform discharges were seen throughout the recording. Priyanka   Overnight EEG with video  Result Date: 01/12/2021 , MD     01/13/2021  9:26 AM Patient Name: LYNNELLE MESMER MRN: Charlsie Quest Epilepsy Attending: 03/15/2021 Referring Physician/Provider: Dr [name] Duration: 01/11/2021 1126 to 01/12/2021 1126  Patient history: 87 year old female with history of seizures presented with altered mental status.  EEG to evaluate for seizures.  Level of alertness: Awake, asleep  AEDs during EEG study: Keppra  Technical aspects: This EEG study was done with scalp electrodes positioned according to the 10-20 International system of electrode placement. Electrical  activity was acquired at a sampling rate of 500Hz  and reviewed with a high frequency filter of 70Hz  and a low frequency filter of 1Hz . EEG data were recorded continuously and digitally stored.  Description: The posterior dominant rhythm consists of 9 Hz activity of moderate voltage (25-35 uV) seen predominantly in posterior head regions, symmetric and reactive to eye opening and eye closing. Sleep was characterized by vertex waves, sleep spindles (12 to 14 Hz), maximal frontocentral region.  EEG showed intermittent generalized 3 to 6 Hz theta-delta slowing.  EEG also showed 2 to 3 Hz sharply contoured delta slowing in left frontal region which at times appeared rhythmic with some evolution in morphology. Four seizures without clinical signs were recorded on 01/12/2021 at 0111, 0158, 0359 and 0713.  Concomitant EEG showed 5 to 6 Hz theta slowing admixed with sharp waves in left frontal region which gradually evolved in frequency to 2 to 3 Hz delta slowing and spread to involve right frontal region.  Average duration of seizures was about 45 seconds. Patient event button was pressed on 01/11/2021 multiple times between 1518 to 1651 for unclear reasons.  Concomitant EEG before, during and after the event did not show any EEG changes to seizure.  ABNORMALITY -Focal seizure without clinical signs, left frontal region -Intermittent rhythmic delta activity, left frontal region -Intermittent slow, generalized  IMPRESSION: This study showed four seizures without clinical signs arising from left frontal region, on 01/12/2021 at 0111, 0158, 0359 and 0713, average duration 45 seconds.  There is also evidence of cortical dysfunction in left frontal region where at times rhythmic slowing was noted which is on the ictal-interictal continuum with low potential for seizures.  Additionally there is evidence of mild diffuse encephalopathy, nonspecific etiology. Dr. was notified. Priyanka     TODAY-DAY OF  DISCHARGE:  Subjective:   [name] today has no headache,no chest abdominal pain,no new weakness tingling or numbness, feels much better wants to go home today.   Objective:   Blood pressure 137/62, pulse 82, temperature 99.3 F (37.4 C), temperature source Oral, resp. rate  20, height  (1.626 m), weight 76.3 kg, SpO2 100 %.  Intake/Output Summary (Last 24 hours) at 01/14/2021 0952 Last data filed at 01/13/2021 1800 Gross per 24 hour  Intake 477 ml  Output 300 ml  Net 177 ml   Filed Weights   01/08/21 1756 01/08/21 2355  Weight: 76.3 kg 76.3 kg    Exam: Awake Alert, Oriented *3, No new F.N deficits, Normal affect [place].AT,PERRAL Supple Neck,No JVD, No cervical lymphadenopathy appriciated.  Symmetrical Chest wall movement, Good air movement bilaterally, CTAB RRR,No Gallops,Rubs or new Murmurs, No Parasternal Heave +ve B.Sounds, Abd Soft, Non tender, No organomegaly appriciated, No rebound -guarding or rigidity. No Cyanosis, Clubbing or edema, No new Rash or bruise   PERTINENT RADIOLOGIC STUDIES: No results found.   PERTINENT LAB RESULTS: CBC: Recent Labs    01/12/21 0346  WBC 6.8  HGB 9.2*  HCT 28.8*  PLT 151   CMET CMP     Component Value Date/Time   NA 139 01/12/2021 0346   NA 134 (A) 04/25/2015 0000   K 4.1 01/12/2021 0346   CL 104 01/12/2021 0346   CO2 24 01/12/2021 0346   GLUCOSE 99 01/12/2021 0346   BUN 10 01/12/2021 0346   BUN 36 (A) 04/25/2015 0000   CREATININE 0.91 01/12/2021 0346   CALCIUM 8.9 01/12/2021 0346   PROT 6.4 (L) 01/12/2021 0346   ALBUMIN 3.1 (L) 01/12/2021 0346   AST 29 01/12/2021 0346   ALT 11 01/12/2021 0346   ALKPHOS 46 01/12/2021 0346   BILITOT 0.8 01/12/2021 0346   GFRNONAA >60 01/12/2021 0346   GFRAA 55 (L) 08/02/2020 1946    GFR Estimated Creatinine Clearance: 45.2 mL/min (by C-G formula based on SCr of 0.91 mg/dL). No results for input(s): LIPASE, AMYLASE in the last 72 hours. No results for input(s): CKTOTAL,  CKMB, CKMBINDEX, TROPONINI in the last 72 hours. Invalid input(s): POCBNP No results for input(s): DDIMER in the last 72 hours. Recent Labs    01/12/21 0346  HGBA1C 6.2*   No results for input(s): CHOL, HDL, LDLCALC, TRIG, CHOLHDL, LDLDIRECT in the last 72 hours. Recent Labs    01/11/21 1222  TSH 2.020   Recent Labs    01/11/21 2107  VITAMINB12 2,087*   Coags: No results for input(s): INR in the last 72 hours.  Invalid input(s): PT Microbiology: Recent Results (from the past 240 hour(s))  Urine culture     Status: Abnormal   Collection Time: 01/08/21  6:56 PM   Specimen: Urine, Random  Result Value Ref Range Status   Specimen Description URINE, RANDOM  Final   Special Requests   Final    NONE Performed at [facility], [address], [place], [place] [address]    Culture 50,000 COLONIES/mL ESCHERICHIA COLI (A)  Final   Report Status 01/10/2021 FINAL  Final   Organism ID, Bacteria ESCHERICHIA COLI (A)  Final      Susceptibility   Escherichia coli - MIC*    AMPICILLIN 4 SENSITIVE Sensitive     CEFAZOLIN <=4 SENSITIVE Sensitive     CEFEPIME <=0.12 SENSITIVE Sensitive     CEFTRIAXONE <=0.25 SENSITIVE Sensitive     CIPROFLOXACIN <=0.25 SENSITIVE Sensitive     GENTAMICIN <=1 SENSITIVE Sensitive     IMIPENEM <=0.25 SENSITIVE Sensitive     NITROFURANTOIN <=16 SENSITIVE Sensitive     TRIMETH/SULFA <=20 SENSITIVE Sensitive     AMPICILLIN/SULBACTAM <=2 SENSITIVE Sensitive     PIP/TAZO <=4 SENSITIVE Sensitive     *  50,000 COLONIES/mL ESCHERICHIA COLI  Resp Panel by RT-PCR (Flu A&B, Covid) Urine, Clean Catch     Status: None   Collection Time: 01/08/21  6:56 PM   Specimen: Urine, Clean Catch; Nasopharyngeal(NP) swabs in vial transport medium  Result Value Ref Range Status   SARS Coronavirus 2 by RT PCR NEGATIVE NEGATIVE Final    Comment: (NOTE) SARS-CoV-2 target nucleic acids are NOT DETECTED.  The SARS-CoV-2 RNA is generally detectable in upper  respiratory specimens during the acute phase of infection. The lowest concentration of SARS-CoV-2 viral copies this assay can detect is 138 copies/mL. A negative result does not preclude SARS-Cov-2 infection and should not be used as the sole basis for treatment or other patient management decisions. A negative result may occur with  improper specimen collection/handling, submission of specimen other than nasopharyngeal swab, presence of viral mutation(s) within the areas targeted by this assay, and inadequate number of viral copies(<138 copies/mL). A negative result must be combined with clinical observations, patient history, and epidemiological information. The expected result is Negative.  Fact Sheet for Patients:  BloggerCourse.com  Fact Sheet for Healthcare Providers:  SeriousBroker.it  This test is no t yet approved or cleared by the [place] FDA and  has been authorized for detection and/or diagnosis of SARS-CoV-2 by FDA under an Emergency Use Authorization (EUA). This EUA will remain  in effect (meaning this test can be used) for the duration of the COVID-19 declaration under Section 564(b)(1) of the Act, 21 U.S.C.section 360bbb-3(b)(1), unless the authorization is terminated  or revoked sooner.       Influenza A by PCR NEGATIVE NEGATIVE Final   Influenza B by PCR NEGATIVE NEGATIVE Final    Comment: (NOTE) The Xpert Xpress SARS-CoV-2/FLU/RSV plus assay is intended as an aid in the diagnosis of influenza from Nasopharyngeal swab specimens and should not be used as a sole basis for treatment. Nasal washings and aspirates are unacceptable for Xpert Xpress SARS-CoV-2/FLU/RSV testing.  Fact Sheet for Patients: BloggerCourse.com  Fact Sheet for Healthcare Providers: SeriousBroker.it  This test is not yet approved or cleared by the [place] FDA and has been  authorized for detection and/or diagnosis of SARS-CoV-2 by FDA under an Emergency Use Authorization (EUA). This EUA will remain in effect (meaning this test can be used) for the duration of the COVID-19 declaration under Section 564(b)(1) of the Act, 21 U.S.C. section 360bbb-3(b)(1), unless the authorization is terminated or revoked.  Performed at [facility], [address], [place], [place] [address]   Blood Culture (routine x 2)     Status: None   Collection Time: 01/08/21  7:27 PM   Specimen: Site Not Specified; Blood  Result Value Ref Range Status   Specimen Description SITE NOT SPECIFIED  Final   Special Requests   Final    BOTTLES DRAWN AEROBIC AND ANAEROBIC Blood Culture adequate volume   Culture   Final    NO GROWTH 5 DAYS Performed at [facility], [address] [place], [place] [address]    Report Status 01/13/2021 FINAL  Final  Blood Culture (routine x 2)     Status: None   Collection Time: 01/08/21  7:55 PM   Specimen: BLOOD RIGHT ARM  Result Value Ref Range Status   Specimen Description BLOOD RIGHT ARM UPPER  Final   Special Requests   Final    BOTTLES DRAWN AEROBIC AND ANAEROBIC Blood Culture adequate volume   Culture   Final    NO  GROWTH 5 DAYS Performed at [facility], [address] [place], [place] [address]    Report Status 01/13/2021 FINAL  Final  SARS CORONAVIRUS 2 (TAT 6-24 HRS) Nasopharyngeal Nasopharyngeal Swab     Status: None   Collection Time: 01/10/21  4:42 PM   Specimen: Nasopharyngeal Swab  Result Value Ref Range Status   SARS Coronavirus 2 NEGATIVE NEGATIVE Final    Comment: (NOTE) SARS-CoV-2 target nucleic acids are NOT DETECTED.  The SARS-CoV-2 RNA is generally detectable in upper and lower respiratory specimens during the acute phase of infection. Negative results do not preclude SARS-CoV-2 infection, do not rule out co-infections with other pathogens, and should not be used as the sole basis for treatment or  other patient management decisions. Negative results must be combined with clinical observations, patient history, and epidemiological information. The expected result is Negative.  Fact Sheet for Patients: HairSlick.no  Fact Sheet for Healthcare Providers: quierodirigir.com  This test is not yet approved or cleared by the [place] FDA and  has been authorized for detection and/or diagnosis of SARS-CoV-2 by FDA under an Emergency Use Authorization (EUA). This EUA will remain  in effect (meaning this test can be used) for the duration of the COVID-19 declaration under Se ction 564(b)(1) of the Act, 21 U.S.C. section 360bbb-3(b)(1), unless the authorization is terminated or revoked sooner.  Performed at [facility], [address] [place], [place] [address]     FURTHER DISCHARGE INSTRUCTIONS:  Get Medicines reviewed and adjusted: Please take all your medications with you for your next visit with your Primary MD  Laboratory/radiological data: Please request your Primary MD to go over all hospital tests and procedure/radiological results at the follow up, please ask your Primary MD to get all Hospital records sent to his/her office.  In some cases, they will be blood work, cultures and biopsy results pending at the time of your discharge. Please request that your primary care M.D. goes through all the records of your hospital data and follows up on these results.  Also Note the following: If you experience worsening of your admission symptoms, develop shortness of breath, life threatening emergency, suicidal or homicidal thoughts you must seek medical attention immediately by calling 911 or calling your MD immediately  if symptoms less severe.  You must read complete instructions/literature along with all the possible adverse reactions/side effects for all the Medicines you take and that have been prescribed to you.  Take any new Medicines after you have completely understood and accpet all the possible adverse reactions/side effects.   Do not drive when taking Pain medications or sleeping medications (Benzodaizepines)  Do not take more than prescribed Pain, Sleep and Anxiety Medications. It is not advisable to combine anxiety,sleep and pain medications without talking with your primary care practitioner  Special Instructions: If you have smoked or chewed Tobacco  in the last 2 yrs please stop smoking, stop any regular Alcohol  and or any Recreational drug use.  Wear Seat belts while driving.  Please note: You were cared for by a hospitalist during your hospital stay. Once you are discharged, your primary care physician will handle any further medical issues. Please note that NO REFILLS for any discharge medications will be authorized once you are discharged, as it is imperative that you return to your primary care physician (or establish a relationship with a primary care physician if you do not have one) for your post hospital discharge needs so that they can  reassess your need for medications and monitor your lab values.  Total Time spent coordinating discharge including counseling, education and face to face time equals 35 minutes.  SignedJeoffrey Massed 01/14/2021 9:52 AM

## 2021-01-14 NOTE — Progress Notes (Signed)
Neurology Progress Note  S: No overnight events. No visible seizure activity. EEG overnight negative for seizures. Patient states she is slow but good. Denies HA or any other pain.   O: Current vital signs: BP 137/62 (BP Location: Left Arm)   Pulse 82   Temp 99.3 F (37.4 C) (Oral)   Resp 20   Ht 5\' 4"  (1.626 m)   Wt 76.3 kg   SpO2 100%   BMI 28.87 kg/m  Vital signs in last 24 hours: Temp:  [98.1 F (36.7 C)-99.3 F (37.4 C)] 99.3 F (37.4 C) (03/05 0833) Pulse Rate:  [69-82] 82 (03/05 0833) Resp:  [16-20] 20 (03/05 0833) BP: (117-144)/(52-63) 137/62 (03/05 0833) SpO2:  [98 %-100 %] 100 % (03/05 0833)  GENERAL: Awake, alert in NAD HEENT: Normocephalic and atraumatic LUNGS: Normal respiratory effort.  CV: RRR   Ext: warm   NEURO:  Mental Status: alert and oriented to all except year Speech/Language: speech is without aphasia or dysarthria.  Naming, repetition, fluency, and comprehension intact.  Cranial Nerves:  II: PERRL. Visual fields full.  III, IV, VI: EOMI. Eyelids elevate symmetrically.  V: Sensation is intact to light touch and symmetrical to face.  VII: Smile is symmetrical.   VIII: hearing intact to voice. IX, X: Palate elevates symmetrically. Phonation is normal.  02-24-1969 shrug 5/5. XII: tongue is midline without fasciculations. Motor: grips and triceps 4+/5, biceps 4/5, LEs 4/5  Tone: is normal and bulk is normal Sensation- Intact to light touch bilaterally. Extinction absent to light touch to DSS.    Coordination: no drift Gait- deferred  Medications  Current Facility-Administered Medications:  .  acetaminophen (TYLENOL) tablet 650 mg, 650 mg, Oral, Q6H PRN, Chotiner, [id number], MD, 650 mg at 01/13/21 1711 .  alum & mag hydroxide-simeth (MAALOX/MYLANTA) 200-200-20 MG/5ML suspension 30 mL, 30 mL, Oral, Q6H PRN, 03/15/21, MD, 30 mL at 01/11/21 1427 .  amLODipine (NORVASC) tablet 10 mg, 10 mg, Oral, Daily, Tu, Ching T, DO, 10 mg at  01/13/21 1007 .  aspirin EC tablet 81 mg, 81 mg, Oral, Daily, Tu, Ching T, DO, 81 mg at 01/13/21 1007 .  carvedilol (COREG) tablet 25 mg, 25 mg, Oral, BID WC, Tu, Ching T, DO, 25 mg at 01/14/21 03/16/21 .  citalopram (CELEXA) tablet 10 mg, 10 mg, Oral, Daily, Tu, Ching T, DO, 10 mg at 01/13/21 1008 .  diclofenac Sodium (VOLTAREN) 1 % topical gel 2 g, 2 g, Topical, QID, Ghimire, 03/15/21, MD, 2 g at 01/13/21 2100 .  [COMPLETED] valproate (DEPACON) 1,500 mg in dextrose 5 % 50 mL IVPB, 1,500 mg, Intravenous, Once, Stopped at 01/12/21 1313 **FOLLOWED BY** divalproex (DEPAKOTE) DR tablet 500 mg, 500 mg, Oral, Q12H, Toberman, Stevi W, NP, 500 mg at 01/13/21 2100 .  enoxaparin (LOVENOX) injection 40 mg, 40 mg, Subcutaneous, Daily, Tu, Ching T, DO, 40 mg at 01/13/21 1007 .  HYDROmorphone (DILAUDID) injection 0.5 mg, 0.5 mg, Intravenous, Q3H PRN, Chotiner, 03/15/21, MD, 0.5 mg at 01/14/21 0836 .  pantoprazole (PROTONIX) EC tablet 40 mg, 40 mg, Oral, Q1200, Ghimire, 03/16/21, MD, 40 mg at 01/13/21 1007 .  thiamine tablet 100 mg, 100 mg, Oral, Daily, 03/15/21, MD, 100 mg at 01/13/21 1008   EEG 01/14/21 This studyis suggestive of mild diffuse encephalopathy, nonspecific etiology.No seizures or definite epileptiform discharges were seen during the study.   Assessment: 87 yo female with known history of seizures with recent non adherence to medication regimen and neurology  f/up. She was admitted 5 days ago with acute metabolic encephalopathy and history of seizures. Neurology was consulted on 01/11/21 and patient exhibited delirium and encephalopathy. Her home Keppra was continued at that time. Continuous EEG was started to rule out subclinical seizures. EEG #1 was negative, but EEG #2 showed 4 seizures. Keppra was discontinued at that point in favor of Depakote due to sleepiness and delirium. Depakote was loaded and mantenance dosing started. cEEG #3 and #4 were negative for seizures or definite  epileptiform discharges. cEEG was discontinued and patient is ready for discharge per neurology. Her delirium appears to be resolved. I believe the discharge plan is SNF.   Plan:  Patient can be discharged today from a neurological standpoint. She should be discharged on Depakote DR 500mg  po q12 hours.  Have patient f/up with outpatient neurology in 4-6 weeks.   Driving precautions:  Per [facility] statutes, patients with seizures are not allowed to drive until they have been seizure-free for six months.   Use caution when using heavy equipment or power tools. Avoid working on ladders or at heights. Take showers instead of baths. Ensure the water temperature is not too high on the home water heater. Do not go swimming alone. Do not lock yourself in a room alone (i.e. bathroom). When caring for infants or small children, sit down when holding, feeding, or changing them to minimize risk of injury to the child in the event you have a seizure. Maintain good sleep hygiene. Avoid alcohol.   If patient has another seizure, call 911 and bring them back to the ED if: A. The seizure lasts longer than 5 minutes.  B. The patient doesn't wake shortly after the seizure or has new problems such as difficulty seeing, speaking or moving following the seizure C. The patient was injured during the seizure D. The patient has a temperature over 102 F (39C) E. The patient vomited during the seizure and now is having trouble breathing  Pt seen by [facility], MSN, APN-BC/Nurse Practitioner/Neuro and later by MD. Note and plan to be edited as needed by MD.  Pager: Jimmye Norman  I have seen the patient reviewed the above note.  She continues to improve.  There are no seizures on EEG.  I suspect that her status epilepticus was secondary to medication noncompliance due to medication side effects with Keppra.  She is doing well on Depakote 500 mg twice daily, she will need to follow-up with  neurology as an outpatient.  Please call With any further questions or concerns.  [phone], MD [facility] [phone]  If 7pm- 7am, please page neurology on call as listed in AMION.

## 2021-01-14 NOTE — TOC Progression Note (Addendum)
Transition of Care [facility]) - Progression Note    Patient Details  Name: [name] MRN: [id number] Date of Birth: Jan 17, 1933  [facility] CM/SW Contact  Carley Hammed, [place] Phone Number: 01/14/2021, 11:48 AM  Clinical Narrative:    MD notified CSW that pt could DC after covid results. CSW attempted to contact [facility] [place]ande [place], [place], and Star), facility main number, and has a message in to Interior and spatial designer of nursing. CSW will continue to follow for DC planning.  CSW was notified by Star at [facility] that [place] had run out on Monday and they might need to get auth again. She will update CSW.  1:30 CSW was notified by [place] that they had to restart insurance auth and will have to take this pt tomorrow. SW will follow for DC tomorrow.  3:30 CSW notified by [facility] that they will not be able to take pt until Monday as the insurance company is closed for the weekend. CSW will update MD and family.   Expected Discharge Plan: Skilled Nursing Facility Barriers to Discharge: Continued Medical Work up,Insurance Authorization  Expected Discharge Plan and Services Expected Discharge Plan: Skilled Nursing Facility     Post Acute Care Choice: Skilled Nursing Facility Living arrangements for the past 2 months: Single Family Home Expected Discharge Date: 01/14/21                                     Social Determinants of Health (SDOH) Interventions    Readmission Risk Interventions Readmission Risk Prevention Plan 02/08/2020  Transportation Screening Complete  Medication Review Oceanographer) Complete  PCP or Specialist appointment within 3-5 days of discharge Complete  HRI or Home Care Consult Complete  SW Recovery Care/Counseling Consult Complete  Palliative Care Screening Not Applicable  Skilled Nursing Facility Complete  Some recent data might be hidden

## 2021-01-15 DIAGNOSIS — I5032 Chronic diastolic (congestive) heart failure: Secondary | ICD-10-CM | POA: Diagnosis not present

## 2021-01-15 MED ORDER — CARVEDILOL 12.5 MG PO TABS
12.5000 mg | ORAL_TABLET | Freq: Two times a day (BID) | ORAL | Status: DC
  Administered 2021-01-16: 12.5 mg via ORAL
  Filled 2021-01-15 (×2): qty 1

## 2021-01-15 MED ORDER — AMLODIPINE BESYLATE 5 MG PO TABS
5.0000 mg | ORAL_TABLET | Freq: Every day | ORAL | Status: DC
  Administered 2021-01-16: 5 mg via ORAL
  Filled 2021-01-15 (×2): qty 1

## 2021-01-15 NOTE — Progress Notes (Signed)
PROGRESS NOTE        PATIENT DETAILS Name: [name] Age: 87 y.o. Sex: female Date of Birth: 21-Jul-1933 Admit Date: 01/08/2021 Admitting Physician Azucena Fallen, MD [name]:[id number], [name], MD  Brief Narrative: Patient is a 87 y.o. female with history of CAD s/p PCI, chronic diastolic heart failure, TIA, DM-2, CKD stage IIIa, PMR, depression, seizure disorder-who presented with altered mental status in the setting of noncompliance with antiseizure medications-patient was thought to have had a breakthrough seizure with resultant postictal state/metabolic encephalopathy and subsequently admitted to the hospitalist service.  See below for further details.  Significant events: 2/27>> admit for concern of breakthrough seizures-postictal state  Significant studies: 2/27>> CT abdomen/pelvis: No acute abnormality 2/27>> CT head: No acute intracranial abnormality 2/27>> chest x-ray: No pneumonia 2/28>> MRI brain: No acute intracranial findings. 2/28>> EEG: No seizures 3/2-3/3>> LTM EEG: four seizures without clinical signs arising from left frontal region 3/3-3/4>> LTM EEG: Cortical dysfunction left frontal region-no seizures  Antimicrobial therapy: Rocephin: 2/27>> 3/2 Zithromax: 3/1>> 3/2  Microbiology data: 2/27>> urine culture: E. Coli-pansensitive 2/27>> blood culture: No growth 2/27>> Covid/influenza PCR: Negative  Procedures : None  Consults: Neurology  DVT Prophylaxis : enoxaparin (LOVENOX) injection 40 mg Start: 01/09/21 1000   Subjective: No major issues overnight-completely awake and alert.  Assessment/Plan: Breakthrough seizures:  Apparently noncompliant to Keppra due to sedation/fatigue-LTM EEG positive for seizures-neurology follow closely-now on Depakote.  Awaiting SNF bed.   Acute metabolic encephalopathy/postictal state: Awake/alert this morning.  Maintain delirium precautions.    Possible aspiration pneumonia:  Clinically improved-better-no longer on antimicrobial therapy.  Asymptomatic bacteriuria: Urine culture positive-UA without significant bacteriuria-patient has no symptoms consistent with UTI.  Does not need antimicrobial therapy-has completed 3 days of IV Rocephin in any event.  History of CAD-s/p PCI: No anginal symptoms-continue aspirin  Chronic diastolic heart failure: Euvolemic on exam  HTN: BP relatively stable-on the softer side-have decreased dosage of both Coreg and amlodipine.  Follow and optimize.     DM-2 (A1c 6.2 on 3/3): Continue close monitoring-does not need any specific treatment at this point.  Depression: Relatively stable-continue Celexa  Rheumatoid arthritis: Hold methotrexate for now  Diet: Diet Order            Diet - low sodium heart healthy           Diet Carb Modified           Diet Heart Room service appropriate? Yes; Fluid consistency: Thin  Diet effective now                  Code Status: Full code   Family Communication: Spoke with Daughter-[phone] over the phone on 3/6  Disposition Plan: Status is: Inpatient  Remains inpatient appropriate because:Inpatient level of care appropriate due to severity of illness   Dispo: The patient is from: Home              Anticipated d/c is to: SNF              Patient currently is not medically stable to d/c.   Difficult to place patient No  Barriers to Discharge: Await formal EEG reading-neurology planning on loading patient with IV Depakote today.  Possible discharge to SNF on 3/4  Antimicrobial agents: Anti-infectives (From admission, onward)   Start     Dose/Rate Route Frequency Ordered  Stop   01/10/21 1400  azithromycin (ZITHROMAX) tablet 500 mg  Status:  Discontinued        500 mg Oral Daily 01/10/21 0927 01/11/21 0948   01/08/21 1815  cefTRIAXone (ROCEPHIN) 2 g in sodium chloride 0.9 % 100 mL IVPB  Status:  Discontinued        2 g 200 mL/hr over 30 Minutes Intravenous Every 24  hours 01/08/21 1807 01/11/21 0948   01/08/21 1815  azithromycin (ZITHROMAX) 500 mg in sodium chloride 0.9 % 250 mL IVPB  Status:  Discontinued        500 mg 250 mL/hr over 60 Minutes Intravenous Every 24 hours 01/08/21 1807 01/10/21 0927       Time spent: 15 minutes-Greater than 50% of this time was spent in counseling, explanation of diagnosis, planning of further management, and coordination of care.  MEDICATIONS: Scheduled Meds: . amLODipine  5 mg Oral Daily  . aspirin EC  81 mg Oral Daily  . carvedilol  12.5 mg Oral BID WC  . citalopram  10 mg Oral Daily  . diclofenac Sodium  2 g Topical QID  . divalproex  500 mg Oral Q12H  . enoxaparin (LOVENOX) injection  40 mg Subcutaneous Daily  . pantoprazole  40 mg Oral Q1200  . thiamine  100 mg Oral Daily   Continuous Infusions:  PRN Meds:.acetaminophen, alum & mag hydroxide-simeth, HYDROmorphone (DILAUDID) injection   PHYSICAL EXAM: Vital signs: Vitals:   01/15/21 0402 01/15/21 0700 01/15/21 0839 01/15/21 1004  BP: (!) 121/50  (!) 118/50 (!) 114/56  Pulse: 69 100 75   Resp: 17 18 18    Temp: 98.2 F (36.8 C) 99.4 F (37.4 C) 98.7 F (37.1 C)   TempSrc: Oral Axillary Rectal   SpO2: 97% 100% 100%   Weight:      Height:       Filed Weights   01/08/21 1756 01/08/21 2355  Weight: 76.3 kg 76.3 kg   Body mass index is 28.87 kg/m.   Gen Exam:Alert awake-not in any distress HEENT:atraumatic, normocephalic Chest: B/L clear to auscultation anteriorly CVS:S1S2 regular Abdomen:soft non tender, non distended Extremities:no edema Neurology: Non focal Skin: no rash  I have personally reviewed following labs and imaging studies  LABORATORY DATA: CBC: Recent Labs  Lab 01/08/21 1752 01/09/21 0312 01/10/21 0444 01/11/21 0345 01/12/21 0346  WBC 6.6 6.4 6.0 7.4 6.8  NEUTROABS 4.8  --   --   --   --   HGB 8.9* 8.7* 8.4* 9.3* 9.2*  HCT 28.8* 26.8* 25.8* 29.3* 28.8*  MCV 99.7 98.2 96.3 97.0 97.0  PLT 169 157 151 153 151     Basic Metabolic Panel: Recent Labs  Lab 01/08/21 1752 01/09/21 0312 01/10/21 0444 01/11/21 0345 01/12/21 0346  NA 135 139 138 138 139  K 4.1 4.2 3.2* 3.9 4.1  CL 105 108 106 105 104  CO2 19* 19* 21* 18* 24  GLUCOSE 108* 81 69* 95 99  BUN 19 17 9 12 10   CREATININE 1.00 0.94 0.86 0.91 0.91  CALCIUM 9.1 9.1 8.9 9.1 8.9    GFR: Estimated Creatinine Clearance: 45.2 mL/min (by C-G formula based on SCr of 0.91 mg/dL).  Liver Function Tests: Recent Labs  Lab 01/08/21 1752 01/10/21 0444 01/11/21 0345 01/12/21 0346  AST 25 27 33 29  ALT 11 11 12 11   ALKPHOS 58 48 53 46  BILITOT 1.2 1.4* 1.2 0.8  PROT 7.0 6.1* 6.5 6.4*  ALBUMIN 3.4* 3.0* 3.3* 3.1*  No results for input(s): LIPASE, AMYLASE in the last 168 hours. Recent Labs  Lab 01/08/21 1955  AMMONIA 17    Coagulation Profile: Recent Labs  Lab 01/08/21 1808  INR 1.2    Cardiac Enzymes: No results for input(s): CKTOTAL, CKMB, CKMBINDEX, TROPONINI in the last 168 hours.  BNP (last 3 results) No results for input(s): PROBNP in the last 8760 hours.  Lipid Profile: No results for input(s): CHOL, HDL, LDLCALC, TRIG, CHOLHDL, LDLDIRECT in the last 72 hours.  Thyroid Function Tests: No results for input(s): TSH, T4TOTAL, FREET4, T3FREE, THYROIDAB in the last 72 hours.  Anemia Panel: No results for input(s): VITAMINB12, FOLATE, FERRITIN, TIBC, IRON, RETICCTPCT in the last 72 hours.  Urine analysis:    Component Value Date/Time   COLORURINE YELLOW 01/08/2021 1856   APPEARANCEUR CLEAR 01/08/2021 1856   LABSPEC 1.014 01/08/2021 1856   LABSPEC 1.020 04/23/2006 1313   PHURINE 6.0 01/08/2021 1856   GLUCOSEU NEGATIVE 01/08/2021 1856   GLUCOSEU NEGATIVE 02/28/2016 1559   HGBUR NEGATIVE 01/08/2021 1856   BILIRUBINUR NEGATIVE 01/08/2021 1856   BILIRUBINUR Negative 04/23/2006 1313   KETONESUR NEGATIVE 01/08/2021 1856   PROTEINUR 100 (A) 01/08/2021 1856   UROBILINOGEN 0.2 02/28/2016 1559   NITRITE NEGATIVE  01/08/2021 1856   LEUKOCYTESUR NEGATIVE 01/08/2021 1856   LEUKOCYTESUR Trace 04/23/2006 1313    Sepsis Labs: Lactic Acid, Venous    Component Value Date/Time   LATICACIDVEN 1.0 01/08/2021 1752    MICROBIOLOGY: Recent Results (from the past 240 hour(s))  Urine culture     Status: Abnormal   Collection Time: 01/08/21  6:56 PM   Specimen: Urine, Random  Result Value Ref Range Status   Specimen Description URINE, RANDOM  Final   Special Requests   Final    NONE Performed at [facility], [address], [place], [place] [address]    Culture 50,000 COLONIES/mL ESCHERICHIA COLI (A)  Final   Report Status 01/10/2021 FINAL  Final   Organism ID, Bacteria ESCHERICHIA COLI (A)  Final      Susceptibility   Escherichia coli - MIC*    AMPICILLIN 4 SENSITIVE Sensitive     CEFAZOLIN <=4 SENSITIVE Sensitive     CEFEPIME <=0.12 SENSITIVE Sensitive     CEFTRIAXONE <=0.25 SENSITIVE Sensitive     CIPROFLOXACIN <=0.25 SENSITIVE Sensitive     GENTAMICIN <=1 SENSITIVE Sensitive     IMIPENEM <=0.25 SENSITIVE Sensitive     NITROFURANTOIN <=16 SENSITIVE Sensitive     TRIMETH/SULFA <=20 SENSITIVE Sensitive     AMPICILLIN/SULBACTAM <=2 SENSITIVE Sensitive     PIP/TAZO <=4 SENSITIVE Sensitive     * 50,000 COLONIES/mL ESCHERICHIA COLI  Resp Panel by RT-PCR (Flu A&B, Covid) Urine, Clean Catch     Status: None   Collection Time: 01/08/21  6:56 PM   Specimen: Urine, Clean Catch; Nasopharyngeal(NP) swabs in vial transport medium  Result Value Ref Range Status   SARS Coronavirus 2 by RT PCR NEGATIVE NEGATIVE Final    Comment: (NOTE) SARS-CoV-2 target nucleic acids are NOT DETECTED.  The SARS-CoV-2 RNA is generally detectable in upper respiratory specimens during the acute phase of infection. The lowest concentration of SARS-CoV-2 viral copies this assay can detect is 138 copies/mL. A negative result does not preclude SARS-Cov-2 infection and should not be used as the sole basis for treatment  or other patient management decisions. A negative result may occur with  improper specimen collection/handling, submission of specimen other than nasopharyngeal swab, presence of viral mutation(s) within  the areas targeted by this assay, and inadequate number of viral copies(<138 copies/mL). A negative result must be combined with clinical observations, patient history, and epidemiological information. The expected result is Negative.  Fact Sheet for Patients:  BloggerCourse.com  Fact Sheet for Healthcare Providers:  SeriousBroker.it  This test is no t yet approved or cleared by the [place] FDA and  has been authorized for detection and/or diagnosis of SARS-CoV-2 by FDA under an Emergency Use Authorization (EUA). This EUA will remain  in effect (meaning this test can be used) for the duration of the COVID-19 declaration under Section 564(b)(1) of the Act, 21 U.S.C.section 360bbb-3(b)(1), unless the authorization is terminated  or revoked sooner.       Influenza A by PCR NEGATIVE NEGATIVE Final   Influenza B by PCR NEGATIVE NEGATIVE Final    Comment: (NOTE) The Xpert Xpress SARS-CoV-2/FLU/RSV plus assay is intended as an aid in the diagnosis of influenza from Nasopharyngeal swab specimens and should not be used as a sole basis for treatment. Nasal washings and aspirates are unacceptable for Xpert Xpress SARS-CoV-2/FLU/RSV testing.  Fact Sheet for Patients: BloggerCourse.com  Fact Sheet for Healthcare Providers: SeriousBroker.it  This test is not yet approved or cleared by the [place] FDA and has been authorized for detection and/or diagnosis of SARS-CoV-2 by FDA under an Emergency Use Authorization (EUA). This EUA will remain in effect (meaning this test can be used) for the duration of the COVID-19 declaration under Section 564(b)(1) of the Act, 21 U.S.C. section  360bbb-3(b)(1), unless the authorization is terminated or revoked.  Performed at [facility], [address], [place], [place] [address]   Blood Culture (routine x 2)     Status: None   Collection Time: 01/08/21  7:27 PM   Specimen: Site Not Specified; Blood  Result Value Ref Range Status   Specimen Description SITE NOT SPECIFIED  Final   Special Requests   Final    BOTTLES DRAWN AEROBIC AND ANAEROBIC Blood Culture adequate volume   Culture   Final    NO GROWTH 5 DAYS Performed at [facility], [address] [place], [place] [address]    Report Status 01/13/2021 FINAL  Final  Blood Culture (routine x 2)     Status: None   Collection Time: 01/08/21  7:55 PM   Specimen: BLOOD RIGHT ARM  Result Value Ref Range Status   Specimen Description BLOOD RIGHT ARM UPPER  Final   Special Requests   Final    BOTTLES DRAWN AEROBIC AND ANAEROBIC Blood Culture adequate volume   Culture   Final    NO GROWTH 5 DAYS Performed at [facility], [address] [place], [place] [address]    Report Status 01/13/2021 FINAL  Final  SARS CORONAVIRUS 2 (TAT 6-24 HRS) Nasopharyngeal Nasopharyngeal Swab     Status: None   Collection Time: 01/10/21  4:42 PM   Specimen: Nasopharyngeal Swab  Result Value Ref Range Status   SARS Coronavirus 2 NEGATIVE NEGATIVE Final    Comment: (NOTE) SARS-CoV-2 target nucleic acids are NOT DETECTED.  The SARS-CoV-2 RNA is generally detectable in upper and lower respiratory specimens during the acute phase of infection. Negative results do not preclude SARS-CoV-2 infection, do not rule out co-infections with other pathogens, and should not be used as the sole basis for treatment or other patient management decisions. Negative results must be combined with clinical observations, patient history, and epidemiological information. The expected result is Negative.  Fact Sheet for Patients: HairSlick.no  Fact Sheet for  Healthcare Providers: quierodirigir.com  This test is not yet approved or cleared by the [place] FDA and  has been authorized for detection and/or diagnosis of SARS-CoV-2 by FDA under an Emergency Use Authorization (EUA). This EUA will remain  in effect (meaning this test can be used) for the duration of the COVID-19 declaration under Se ction 564(b)(1) of the Act, 21 U.S.C. section 360bbb-3(b)(1), unless the authorization is terminated or revoked sooner.  Performed at [facility], [address], [place], [place] [address]   SARS CORONAVIRUS 2 (TAT 6-24 HRS) Nasopharyngeal Nasopharyngeal Swab     Status: None   Collection Time: 01/14/21  8:55 AM   Specimen: Nasopharyngeal Swab  Result Value Ref Range Status   SARS Coronavirus 2 NEGATIVE NEGATIVE Final    Comment: (NOTE) SARS-CoV-2 target nucleic acids are NOT DETECTED.  The SARS-CoV-2 RNA is generally detectable in upper and lower respiratory specimens during the acute phase of infection. Negative results do not preclude SARS-CoV-2 infection, do not rule out co-infections with other pathogens, and should not be used as the sole basis for treatment or other patient management decisions. Negative results must be combined with clinical observations, patient history, and epidemiological information. The expected result is Negative.  Fact Sheet for Patients: HairSlick.no  Fact Sheet for Healthcare Providers: quierodirigir.com  This test is not yet approved or cleared by the [place] FDA and  has been authorized for detection and/or diagnosis of SARS-CoV-2 by FDA under an Emergency Use Authorization (EUA). This EUA will remain  in effect (meaning this test can be used) for the duration of the COVID-19 declaration under Se ction 564(b)(1) of the Act, 21 U.S.C. section 360bbb-3(b)(1), unless the authorization is terminated or revoked  sooner.  Performed at [facility], [address], [place], [place] [address]     RADIOLOGY STUDIES/RESULTS: No results found.   LOS: 5 days   Jeoffrey Massed, MD  [facility]    To contact the attending provider between 7A-7P or the covering provider during after hours 7P-7A, please log into the web site [email or web address] and access using [facility] password for that web site. If you do not have the password, please call the hospital operator.  01/15/2021, 11:29 AM

## 2021-01-16 DIAGNOSIS — G459 Transient cerebral ischemic attack, unspecified: Secondary | ICD-10-CM | POA: Diagnosis not present

## 2021-01-16 DIAGNOSIS — D539 Nutritional anemia, unspecified: Secondary | ICD-10-CM | POA: Diagnosis not present

## 2021-01-16 DIAGNOSIS — I517 Cardiomegaly: Secondary | ICD-10-CM | POA: Diagnosis not present

## 2021-01-16 DIAGNOSIS — R5381 Other malaise: Secondary | ICD-10-CM | POA: Diagnosis not present

## 2021-01-16 DIAGNOSIS — D6489 Other specified anemias: Secondary | ICD-10-CM | POA: Diagnosis not present

## 2021-01-16 DIAGNOSIS — R2689 Other abnormalities of gait and mobility: Secondary | ICD-10-CM | POA: Diagnosis not present

## 2021-01-16 DIAGNOSIS — J9811 Atelectasis: Secondary | ICD-10-CM | POA: Diagnosis not present

## 2021-01-16 DIAGNOSIS — R410 Disorientation, unspecified: Secondary | ICD-10-CM | POA: Diagnosis not present

## 2021-01-16 DIAGNOSIS — G40009 Localization-related (focal) (partial) idiopathic epilepsy and epileptic syndromes with seizures of localized onset, not intractable, without status epilepticus: Secondary | ICD-10-CM | POA: Diagnosis not present

## 2021-01-16 DIAGNOSIS — E1159 Type 2 diabetes mellitus with other circulatory complications: Secondary | ICD-10-CM | POA: Diagnosis not present

## 2021-01-16 DIAGNOSIS — G9341 Metabolic encephalopathy: Secondary | ICD-10-CM | POA: Diagnosis not present

## 2021-01-16 DIAGNOSIS — I152 Hypertension secondary to endocrine disorders: Secondary | ICD-10-CM | POA: Diagnosis not present

## 2021-01-16 DIAGNOSIS — E785 Hyperlipidemia, unspecified: Secondary | ICD-10-CM | POA: Diagnosis not present

## 2021-01-16 DIAGNOSIS — I959 Hypotension, unspecified: Secondary | ICD-10-CM | POA: Diagnosis not present

## 2021-01-16 DIAGNOSIS — I2510 Atherosclerotic heart disease of native coronary artery without angina pectoris: Secondary | ICD-10-CM | POA: Diagnosis not present

## 2021-01-16 DIAGNOSIS — R8271 Bacteriuria: Secondary | ICD-10-CM | POA: Diagnosis not present

## 2021-01-16 DIAGNOSIS — I5032 Chronic diastolic (congestive) heart failure: Secondary | ICD-10-CM | POA: Diagnosis not present

## 2021-01-16 DIAGNOSIS — D72829 Elevated white blood cell count, unspecified: Secondary | ICD-10-CM | POA: Diagnosis not present

## 2021-01-16 DIAGNOSIS — Z7401 Bed confinement status: Secondary | ICD-10-CM | POA: Diagnosis not present

## 2021-01-16 DIAGNOSIS — E118 Type 2 diabetes mellitus with unspecified complications: Secondary | ICD-10-CM | POA: Diagnosis not present

## 2021-01-16 DIAGNOSIS — J690 Pneumonitis due to inhalation of food and vomit: Secondary | ICD-10-CM | POA: Diagnosis not present

## 2021-01-16 DIAGNOSIS — K5909 Other constipation: Secondary | ICD-10-CM | POA: Diagnosis not present

## 2021-01-16 DIAGNOSIS — Z7982 Long term (current) use of aspirin: Secondary | ICD-10-CM | POA: Diagnosis not present

## 2021-01-16 DIAGNOSIS — M2550 Pain in unspecified joint: Secondary | ICD-10-CM | POA: Diagnosis not present

## 2021-01-16 DIAGNOSIS — D61818 Other pancytopenia: Secondary | ICD-10-CM | POA: Diagnosis not present

## 2021-01-16 DIAGNOSIS — Z8616 Personal history of COVID-19: Secondary | ICD-10-CM | POA: Diagnosis not present

## 2021-01-16 DIAGNOSIS — N390 Urinary tract infection, site not specified: Secondary | ICD-10-CM | POA: Diagnosis not present

## 2021-01-16 DIAGNOSIS — M79643 Pain in unspecified hand: Secondary | ICD-10-CM | POA: Diagnosis not present

## 2021-01-16 DIAGNOSIS — D72819 Decreased white blood cell count, unspecified: Secondary | ICD-10-CM | POA: Diagnosis not present

## 2021-01-16 DIAGNOSIS — R4182 Altered mental status, unspecified: Secondary | ICD-10-CM | POA: Diagnosis not present

## 2021-01-16 DIAGNOSIS — D696 Thrombocytopenia, unspecified: Secondary | ICD-10-CM | POA: Diagnosis not present

## 2021-01-16 DIAGNOSIS — K8050 Calculus of bile duct without cholangitis or cholecystitis without obstruction: Secondary | ICD-10-CM | POA: Diagnosis not present

## 2021-01-16 DIAGNOSIS — M069 Rheumatoid arthritis, unspecified: Secondary | ICD-10-CM | POA: Diagnosis not present

## 2021-01-16 DIAGNOSIS — F331 Major depressive disorder, recurrent, moderate: Secondary | ICD-10-CM | POA: Diagnosis not present

## 2021-01-16 DIAGNOSIS — F411 Generalized anxiety disorder: Secondary | ICD-10-CM | POA: Diagnosis not present

## 2021-01-16 DIAGNOSIS — F329 Major depressive disorder, single episode, unspecified: Secondary | ICD-10-CM | POA: Diagnosis not present

## 2021-01-16 DIAGNOSIS — M1990 Unspecified osteoarthritis, unspecified site: Secondary | ICD-10-CM | POA: Diagnosis not present

## 2021-01-16 DIAGNOSIS — T25221S Burn of second degree of right foot, sequela: Secondary | ICD-10-CM | POA: Diagnosis not present

## 2021-01-16 DIAGNOSIS — K838 Other specified diseases of biliary tract: Secondary | ICD-10-CM | POA: Diagnosis not present

## 2021-01-16 DIAGNOSIS — K649 Unspecified hemorrhoids: Secondary | ICD-10-CM | POA: Diagnosis not present

## 2021-01-16 DIAGNOSIS — K5900 Constipation, unspecified: Secondary | ICD-10-CM | POA: Diagnosis not present

## 2021-01-16 DIAGNOSIS — G4089 Other seizures: Secondary | ICD-10-CM | POA: Diagnosis not present

## 2021-01-16 DIAGNOSIS — E119 Type 2 diabetes mellitus without complications: Secondary | ICD-10-CM | POA: Diagnosis not present

## 2021-01-16 DIAGNOSIS — R630 Anorexia: Secondary | ICD-10-CM | POA: Diagnosis not present

## 2021-01-16 DIAGNOSIS — Z743 Need for continuous supervision: Secondary | ICD-10-CM | POA: Diagnosis not present

## 2021-01-16 DIAGNOSIS — M810 Age-related osteoporosis without current pathological fracture: Secondary | ICD-10-CM | POA: Diagnosis not present

## 2021-01-16 DIAGNOSIS — R195 Other fecal abnormalities: Secondary | ICD-10-CM | POA: Diagnosis not present

## 2021-01-16 DIAGNOSIS — R41841 Cognitive communication deficit: Secondary | ICD-10-CM | POA: Diagnosis not present

## 2021-01-16 DIAGNOSIS — Z9114 Patient's other noncompliance with medication regimen: Secondary | ICD-10-CM | POA: Diagnosis not present

## 2021-01-16 DIAGNOSIS — N183 Chronic kidney disease, stage 3 unspecified: Secondary | ICD-10-CM | POA: Diagnosis not present

## 2021-01-16 DIAGNOSIS — Z87898 Personal history of other specified conditions: Secondary | ICD-10-CM | POA: Diagnosis not present

## 2021-01-16 DIAGNOSIS — E611 Iron deficiency: Secondary | ICD-10-CM | POA: Diagnosis not present

## 2021-01-16 DIAGNOSIS — E1122 Type 2 diabetes mellitus with diabetic chronic kidney disease: Secondary | ICD-10-CM | POA: Diagnosis not present

## 2021-01-16 DIAGNOSIS — I10 Essential (primary) hypertension: Secondary | ICD-10-CM | POA: Diagnosis not present

## 2021-01-16 DIAGNOSIS — D519 Vitamin B12 deficiency anemia, unspecified: Secondary | ICD-10-CM | POA: Diagnosis not present

## 2021-01-16 DIAGNOSIS — M6281 Muscle weakness (generalized): Secondary | ICD-10-CM | POA: Diagnosis not present

## 2021-01-16 DIAGNOSIS — Z79899 Other long term (current) drug therapy: Secondary | ICD-10-CM | POA: Diagnosis not present

## 2021-01-16 DIAGNOSIS — Z8673 Personal history of transient ischemic attack (TIA), and cerebral infarction without residual deficits: Secondary | ICD-10-CM | POA: Diagnosis not present

## 2021-01-16 DIAGNOSIS — R279 Unspecified lack of coordination: Secondary | ICD-10-CM | POA: Diagnosis not present

## 2021-01-16 DIAGNOSIS — D638 Anemia in other chronic diseases classified elsewhere: Secondary | ICD-10-CM | POA: Diagnosis not present

## 2021-01-16 DIAGNOSIS — I130 Hypertensive heart and chronic kidney disease with heart failure and stage 1 through stage 4 chronic kidney disease, or unspecified chronic kidney disease: Secondary | ICD-10-CM | POA: Diagnosis not present

## 2021-01-16 DIAGNOSIS — R5383 Other fatigue: Secondary | ICD-10-CM | POA: Diagnosis not present

## 2021-01-16 DIAGNOSIS — M0579 Rheumatoid arthritis with rheumatoid factor of multiple sites without organ or systems involvement: Secondary | ICD-10-CM | POA: Diagnosis not present

## 2021-01-16 DIAGNOSIS — G40509 Epileptic seizures related to external causes, not intractable, without status epilepticus: Secondary | ICD-10-CM | POA: Diagnosis not present

## 2021-01-16 DIAGNOSIS — R7981 Abnormal blood-gas level: Secondary | ICD-10-CM | POA: Diagnosis not present

## 2021-01-16 DIAGNOSIS — M25521 Pain in right elbow: Secondary | ICD-10-CM | POA: Diagnosis not present

## 2021-01-16 DIAGNOSIS — K5901 Slow transit constipation: Secondary | ICD-10-CM | POA: Diagnosis not present

## 2021-01-16 DIAGNOSIS — R531 Weakness: Secondary | ICD-10-CM | POA: Diagnosis not present

## 2021-01-16 DIAGNOSIS — I110 Hypertensive heart disease with heart failure: Secondary | ICD-10-CM | POA: Diagnosis not present

## 2021-01-16 DIAGNOSIS — J189 Pneumonia, unspecified organism: Secondary | ICD-10-CM | POA: Diagnosis not present

## 2021-01-16 DIAGNOSIS — R498 Other voice and resonance disorders: Secondary | ICD-10-CM | POA: Diagnosis not present

## 2021-01-16 DIAGNOSIS — K8070 Calculus of gallbladder and bile duct without cholecystitis without obstruction: Secondary | ICD-10-CM | POA: Diagnosis not present

## 2021-01-16 DIAGNOSIS — N83202 Unspecified ovarian cyst, left side: Secondary | ICD-10-CM | POA: Diagnosis not present

## 2021-01-16 DIAGNOSIS — Z13228 Encounter for screening for other metabolic disorders: Secondary | ICD-10-CM | POA: Diagnosis not present

## 2021-01-16 DIAGNOSIS — Z955 Presence of coronary angioplasty implant and graft: Secondary | ICD-10-CM | POA: Diagnosis not present

## 2021-01-16 DIAGNOSIS — K6289 Other specified diseases of anus and rectum: Secondary | ICD-10-CM | POA: Diagnosis not present

## 2021-01-16 DIAGNOSIS — G40909 Epilepsy, unspecified, not intractable, without status epilepticus: Secondary | ICD-10-CM | POA: Diagnosis not present

## 2021-01-16 DIAGNOSIS — R1312 Dysphagia, oropharyngeal phase: Secondary | ICD-10-CM | POA: Diagnosis not present

## 2021-01-16 DIAGNOSIS — R2681 Unsteadiness on feet: Secondary | ICD-10-CM | POA: Diagnosis not present

## 2021-01-16 DIAGNOSIS — R799 Abnormal finding of blood chemistry, unspecified: Secondary | ICD-10-CM | POA: Diagnosis not present

## 2021-01-16 DIAGNOSIS — I5031 Acute diastolic (congestive) heart failure: Secondary | ICD-10-CM | POA: Diagnosis not present

## 2021-01-16 DIAGNOSIS — M0689 Other specified rheumatoid arthritis, multiple sites: Secondary | ICD-10-CM | POA: Diagnosis not present

## 2021-01-16 DIAGNOSIS — E1121 Type 2 diabetes mellitus with diabetic nephropathy: Secondary | ICD-10-CM | POA: Diagnosis not present

## 2021-01-16 DIAGNOSIS — D649 Anemia, unspecified: Secondary | ICD-10-CM | POA: Diagnosis not present

## 2021-01-16 MED ORDER — AMLODIPINE BESYLATE 5 MG PO TABS: 5.0000 mg | ORAL_TABLET | Freq: Every day | ORAL | Status: DC

## 2021-01-16 MED ORDER — CARVEDILOL 12.5 MG PO TABS: 12.5000 mg | ORAL_TABLET | Freq: Two times a day (BID) | ORAL | Status: DC

## 2021-01-16 NOTE — Discharge Summary (Signed)
PATIENT DETAILS Name: [name] Age: 87 y.o. Sex: female Date of Birth: 07/11/1933 MRN: [id number]. Admitting Physician: Azucena Fallen, [name] JYN:[id number], Shelby Quint, [name]  Admit Date: 01/08/2021 Discharge date: 01/16/2021  Recommendations for Outpatient Follow-up:  1. Follow up with PCP in 1-2 weeks 2. Please obtain CMP/CBC in one week 3. Please ensure follow-up with outpatient neurology  Admitted From:  Home  Disposition: SNF   Home Health: No  Equipment/Devices: None  Discharge Condition: Stable  CODE STATUS: FULL CODE  Diet recommendation:  Diet Order            Diet - low sodium heart healthy           Diet Carb Modified           Diet Heart Room service appropriate? Yes; Fluid consistency: Thin  Diet effective now                 Brief Narrative: Patient is a 87 y.o. female with history of CAD s/p PCI, chronic diastolic heart failure, TIA, DM-2, CKD stage IIIa, PMR, depression, seizure disorder-who presented with altered mental status in the setting of noncompliance with antiseizure medications-patient was thought to have had a breakthrough seizure with resultant postictal state/metabolic encephalopathy and subsequently admitted to the hospitalist service.  See below for further details.  Significant events: 2/27>> admit for concern of breakthrough seizures-postictal state  Significant studies: 2/27>> CT abdomen/pelvis: No acute abnormality 2/27>> CT head: No acute intracranial abnormality 2/27>> chest x-ray: No pneumonia 2/28>> MRI brain: No acute intracranial findings. 2/28>> EEG: No seizures 3/2-3/3>> LTM QMV:[id number] without clinical signs arising from left frontal region 3/3-3/4>> LTM EEG: Cortical dysfunction left frontal region-no seizures 3/4-3/5>> LTM EEG: No seizures-mild diffuse encephalopathy  Antimicrobial therapy: Rocephin: 2/27>> 3/2 Zithromax: 3/1>> 3/2  Microbiology data: 2/27>> urine culture: E.  Coli-pansensitive 2/27>> blood culture: No growth 2/27>> Covid/influenza PCR: Negative  Procedures : None  Consults: Neurology  Brief Hospital Course: Breakthrough seizures:  Apparently noncompliant to Keppra due to sedation/fatigue-LTM EEG positive for seizures-neurology following closely-no longer on Keppra-has been started on Depakote.  EEG as above-has been seizure-free for almost 48 hours-spoke with neurology-Dr. Amada Jupiter today-okay for discharge to SNF.  Please ensure follow-up with neurology in the outpatient setting.  Seizure precautions discussed with patient on the morning of discharge (instructions below).  Acute metabolic encephalopathy/postictal state: Awake/alert this morning.  Maintain delirium precautions.    Possible aspiration pneumonia: Clinically improved-afebrile-doubt requires any further antimicrobial therapy at this point-stop Rocephin/Zithromax on 3/2.  Asymptomatic bacteriuria: Urine culture positive-UA without significant bacteriuria-patient has no symptoms consistent with UTI.  Does not need antimicrobial therapy-has completed 3 days of IV Rocephin in any event.  History of CAD-s/p PCI: No anginal symptoms-continue aspirin  Chronic diastolic heart failure: Euvolemic on exam  HTN: BP stable-have decreased amlodipine to 5 mg daily, and Coreg to 12.5 milligrams twice daily.  Continue to adjust in the outpatient setting.  DM-2 (A1c 6.2 on 3/3): Continue close monitoring-does not need any specific treatment at this point.  Depression: Relatively stable-continue Celexa  Rheumatoid arthritis: Resume methotrexate on discharge   Discharge Diagnoses:  Active Problems:   Coronary atherosclerosis   Essential hypertension   AMS (altered mental status)   History of seizure   S/P primary angioplasty with coronary stent   Chronic diastolic CHF (congestive heart failure) [facility])   Discharge Instructions:  Activity:  As tolerated   Discharge  Instructions    Ambulatory referral to Neurology   Complete  by: As directed    An appointment is requested in approximately: 2 weeks   Diet - low sodium heart healthy   Complete by: As directed    Diet Carb Modified   Complete by: As directed    Discharge instructions   Complete by: As directed    Seizure precautions: Per [facility] statutes, patients with seizures are not allowed to drive until they have been seizure-free for six months and cleared by a physician   Use caution when using heavy equipment or power tools. Avoid working on ladders or at heights. Take showers instead of baths. Ensure the water temperature is not too high on the home water heater. Do not go swimming alone. Do not lock yourself in a room alone (i.e. bathroom). When caring for infants or small children, sit down when holding, feeding, or changing them to minimize risk of injury to the child in the event you have a seizure. Maintain good sleep hygiene. Avoid alcohol.   If patienthas another seizure, call 911 and bring them back to the ED if: A. The seizure lasts longer than 5 minutes.  B. The patient doesn't wake shortly after the seizure or has new problems such as difficulty seeing, speaking or moving following the seizure C. The patient was injured during the seizure D. The patient has a temperature over 102 F (39C) E. The patient vomited during the seizure and now is having trouble breathing  During the Seizure  - First, ensure adequate ventilation and place patients on the floor on their left side  Loosen clothing around the neck and ensure the airway is patent. If the patient is clenching the teeth, do not force the mouth open with any object as this can cause severe damage - Remove all items from the surrounding that can be hazardous. The patient may be oblivious to what's happening and may not even know what he or she is doing. If the patient is confused and wandering, either gently  guide him/her away and block access to outside areas - Reassure the individual and be comforting - Call 911. In most cases, the seizure ends before EMS arrives. However, there are cases when seizures may last over 3 to 5 minutes. Or the individual may have developed breathing difficulties or severe injuries. If a pregnant patient or a person with diabetes develops a seizure, it is prudent to call an ambulance. - Finally, if the patient does not regain full consciousness, then call EMS. Most patients will remain confused for about 45 to 90 minutes after a seizure, so you must use judgment in calling for help. - Avoid restraints but make sure the patient is in a bed with padded side rails - Place the individual in a lateral position with the neck slightly flexed; this will help the saliva drain from the mouth and prevent the tongue from falling backward - Remove all nearby furniture and other hazards from the area - Provide verbal assurance as the individual is regaining consciousness - Provide the patient with privacy if possible - Call for help and start treatment as ordered by the caregiver  After the Seizure (Postictal Stage)  After a seizure, most patients experience confusion, fatigue, muscle pain and/or a headache. Thus, one should permit the individual to sleep. For the next few days, reassurance is essential. Being calm and helping reorient the person is also of importance.  Most seizures are painless and end spontaneously. Seizures are not harmful to others but can lead to  complications such as stress on the lungs, brain and the heart. Individuals with prior lung problems may develop labored breathing and respiratory distress.   Follow with Primary [name]  [name], Shelby Quint, [name] in 1-2 weeks  Follow with neurology in 2 weeks  Follow with your primary rheumatologist-in 2-4 weeks  Please get a complete blood count and chemistry panel checked by your Primary [name] at your next visit, and  again as instructed by your Primary [name].  Get Medicines reviewed and adjusted: Please take all your medications with you for your next visit with your Primary [name]  Laboratory/radiological data: Please request your Primary [name] to go over all hospital tests and procedure/radiological results at the follow up, please ask your Primary [name] to get all Hospital records sent to his/her office.  In some cases, they will be blood work, cultures and biopsy results pending at the time of your discharge. Please request that your primary care M.D. follows up on these results.  Also Note the following: If you experience worsening of your admission symptoms, develop shortness of breath, life threatening emergency, suicidal or homicidal thoughts you must seek medical attention immediately by calling 911 or calling your [name] immediately  if symptoms less severe.  You must read complete instructions/literature along with all the possible adverse reactions/side effects for all the Medicines you take and that have been prescribed to you. Take any new Medicines after you have completely understood and accpet all the possible adverse reactions/side effects.   Do not drive when taking Pain medications or sleeping medications (Benzodaizepines)  Do not take more than prescribed Pain, Sleep and Anxiety Medications. It is not advisable to combine anxiety,sleep and pain medications without talking with your primary care practitioner  Special Instructions: If you have smoked or chewed Tobacco  in the last 2 yrs please stop smoking, stop any regular Alcohol  and or any Recreational drug use.  Wear Seat belts while driving.  Please note: You were cared for by a hospitalist during your hospital stay. Once you are discharged, your primary care physician will handle any further medical issues. Please note that NO REFILLS for any discharge medications will be authorized once you are discharged, as it is imperative that you return to  your primary care physician (or establish a relationship with a primary care physician if you do not have one) for your post hospital discharge needs so that they can reassess your need for medications and monitor your lab values.   Increase activity slowly   Complete by: As directed      Allergies as of 01/16/2021      Reactions   Ativan [lorazepam] Other (See Comments)   Very confused   Tramadol Hcl Anxiety, Rash   Headache   Amlodipine Besylate Other (See Comments)    dizzy   Atenolol Other (See Comments)   Fatigue   Benazepril Cough   Benicar [olmesartan Medoxomil] Other (See Comments)   HEADACHE   Cozaar Nausea And Vomiting   Hydralazine Other (See Comments)   Hair loss   Hydrochlorothiazide W-triamterene Other (See Comments)    dizzy   Hydrocodone Other (See Comments)   HEADACHE   Hydroxyzine Pamoate Other (See Comments)   Per MAR   Iodine Other (See Comments)   Per MAR   Lisinopril Other (See Comments), Cough   Tired & fatigue   Peach Flavor Itching   Penicillins Itching   Has patient had a PCN reaction causing immediate rash, facial/tongue/throat swelling, SOB or lightheadedness  with hypotension: No Has patient had a PCN reaction causing severe rash involving mucus membranes or skin necrosis: No Has patient had a PCN reaction that required hospitalization No Has patient had a PCN reaction occurring within the last 10 years: Yes If all of the above answers are "NO", then may proceed with Cephalosporin use. tolerates cephalosporins OK   Pravastatin Other (See Comments)   Myalgias-muscle pain   Prednisolone Nausea Only, Other (See Comments)   Upset stomach   Strawberry Flavor Itching   Tramadol Hcl Other (See Comments)   headache   Benadryl [diphenhydramine] Itching, Palpitations      Medication List    STOP taking these medications   acetaminophen 650 MG CR tablet Commonly known as: TYLENOL Replaced by: acetaminophen 325 MG tablet   B-D SINGLE USE SWABS  REGULAR Pads   doxycycline 100 MG tablet Commonly known as: VIBRA-TABS   levETIRAcetam 500 MG tablet Commonly known as: KEPPRA   meclizine 25 MG tablet Commonly known as: ANTIVERT   nitroGLYCERIN 0.4 MG SL tablet Commonly known as: NITROSTAT   ondansetron 4 MG tablet Commonly known as: ZOFRAN   silver sulfADIAZINE 1 % cream Commonly known as: Silvadene     TAKE these medications   Accu-Chek Aviva Soln Use as directed What changed:   how much to take  how to take this  when to take this  additional instructions   acetaminophen 325 MG tablet Commonly known as: TYLENOL Take 2 tablets (650 mg total) by mouth every 6 (six) hours as needed for mild pain, headache or moderate pain. Replaces: acetaminophen 650 MG CR tablet   amLODipine 5 MG tablet Commonly known as: NORVASC Take 1 tablet (5 mg total) by mouth daily. What changed:   medication strength  how much to take   aspirin EC 81 MG tablet Take 1 tablet (81 mg total) by mouth daily. Swallow whole.   BIOFREEZE EX Apply 1 application topically 2 (two) times daily as needed (pain).   brimonidine 0.2 % ophthalmic solution Commonly known as: ALPHAGAN Place 1 drop into both eyes 2 (two) times daily.   carvedilol 12.5 MG tablet Commonly known as: COREG Take 1 tablet (12.5 mg total) by mouth 2 (two) times daily with a meal. What changed:   medication strength  how much to take   cholecalciferol 25 MCG (1000 UNIT) tablet Commonly known as: VITAMIN D Take 1,000 Units by mouth daily.   citalopram 10 MG tablet Commonly known as: CeleXA Take 1 tablet (10 mg total) by mouth daily.   divalproex 500 MG DR tablet Commonly known as: DEPAKOTE Take 1 tablet (500 mg total) by mouth every 12 (twelve) hours.   feeding supplement (GLUCERNA SHAKE) Liqd Take 237 mLs by mouth 3 (three) times daily between meals. What changed: when to take this   glucose blood test strip Commonly known as: OneTouch Verio Use as  instructed What changed:   how much to take  how to take this  when to take this  additional instructions   methotrexate 2.5 MG tablet Take 10 mg by mouth once a week. For arthritis   OneTouch Delica Lancets Fine Misc 1 Device by Does not apply route daily as needed. What changed: reasons to take this   pantoprazole 40 MG tablet Commonly known as: Protonix Take 1 tablet (40 mg total) by mouth daily.   rosuvastatin 20 MG tablet Commonly known as: CRESTOR Take 1 tablet (20 mg total) by mouth daily.   thiamine 100 MG  tablet Take 1 tablet (100 mg total) by mouth daily.   Voltaren 1 % Gel Generic drug: diclofenac Sodium Apply 1 application topically 2 (two) times daily as needed (pain).       Contact information for follow-up providers    [facility]. Schedule an appointment as soon as possible for a visit in 2 week(s).   Contact information: [address]     [address] [place] [address] [phone]       Shelby Lanius, [name]. Schedule an appointment as soon as possible for a visit in 2 week(s).   Specialty: Rheumatology Contact information: [address] [place] [address] [phone]        [name], Shelby Quint, [name]. Schedule an appointment as soon as possible for a visit in 2 week(s).   Specialty: Internal Medicine Contact information: [address] [place] [address] [phone]        Shelby Rotunda, [name] .   Specialty: Cardiology Contact information: [address] [place] [address] [phone]            Contact information for after-discharge care    Destination    [name] .   Service: Skilled Nursing Contact information: [address] [place] [address] [phone]                 Allergies  Allergen Reactions  . Ativan [Lorazepam] Other (See Comments)    Very confused  . Tramadol Hcl Anxiety and  Rash    Headache  . Amlodipine Besylate Other (See Comments)     dizzy  . Atenolol Other (See Comments)    Fatigue  . Benazepril Cough  . Benicar [Olmesartan Medoxomil] Other (See Comments)    HEADACHE  . Cozaar Nausea And Vomiting  . Hydralazine Other (See Comments)    Hair loss  . Hydrochlorothiazide W-Triamterene Other (See Comments)     dizzy  . Hydrocodone Other (See Comments)    HEADACHE  . Hydroxyzine Pamoate Other (See Comments)    Per MAR  . Iodine Other (See Comments)    Per MAR  . Lisinopril Other (See Comments) and Cough    Tired & fatigue  . Peach Flavor Itching  . Penicillins Itching    Has patient had a PCN reaction causing immediate rash, facial/tongue/throat swelling, SOB or lightheadedness with hypotension: No Has patient had a PCN reaction causing severe rash involving mucus membranes or skin necrosis: No Has patient had a PCN reaction that required hospitalization No Has patient had a PCN reaction occurring within the last 10 years: Yes If all of the above answers are "NO", then may proceed with Cephalosporin use.  tolerates cephalosporins OK   . Pravastatin Other (See Comments)    Myalgias-muscle pain  . Prednisolone Nausea Only and Other (See Comments)    Upset stomach  . Strawberry Flavor Itching  . Tramadol Hcl Other (See Comments)    headache  . Benadryl [Diphenhydramine] Itching and Palpitations    Other Procedures/Studies: CT ABDOMEN PELVIS WO CONTRAST  Result Date: 01/08/2021 CLINICAL DATA:  Acute generalized abdominal pain. EXAM: CT ABDOMEN AND PELVIS WITHOUT CONTRAST TECHNIQUE: Multidetector CT imaging of the abdomen and pelvis was performed following the standard protocol without IV contrast. COMPARISON:  May 10, 2015. FINDINGS: Lower chest: No acute abnormality. Hepatobiliary: No focal liver abnormality is seen. Status post cholecystectomy. No biliary dilatation. Pancreas: Unremarkable. No pancreatic ductal dilatation or surrounding  inflammatory changes. Spleen: Normal  in size without focal abnormality. Adrenals/Urinary Tract: Adrenal glands are unremarkable. Kidneys are normal, without renal calculi, focal lesion, or hydronephrosis. Bladder is unremarkable. Stomach/Bowel: Stomach appears normal. There is no evidence of bowel obstruction or inflammation. Vascular/Lymphatic: Aortic atherosclerosis. No enlarged abdominal or pelvic lymph nodes. Reproductive: Status post hysterectomy. No adnexal masses. Other: No abdominal wall hernia or abnormality. No abdominopelvic ascites. Musculoskeletal: No acute or significant osseous findings. IMPRESSION: Aortic atherosclerosis. No acute abnormality seen in the abdomen or pelvis. Aortic Atherosclerosis (ICD10-I70.0). Electronically Signed   By: Lupita Raider M.D.   On: 01/08/2021 21:23   CT HEAD WO CONTRAST  Result Date: 01/08/2021 CLINICAL DATA:  Mental status change.  Unknown. EXAM: CT HEAD WITHOUT CONTRAST TECHNIQUE: Contiguous axial images were obtained from the base of the skull through the vertex without intravenous contrast. COMPARISON:  CT head 02/01/2002, MR head 07/17/2016 FINDINGS: Brain: Cerebral ventricle sizes are concordant with the degree of cerebral volume loss. Patchy and confluent areas of decreased attenuation are noted throughout the deep and periventricular white matter of the cerebral hemispheres bilaterally, compatible with chronic microvascular ischemic disease. No evidence of large-territorial acute infarction. No parenchymal hemorrhage. No mass lesion. No extra-axial collection. No mass effect or midline shift. No hydrocephalus. Basilar cisterns are patent. No Similar-appearing extended sella that appears partially empty. Vascular: No hyperdense vessel. Atherosclerotic calcifications are present within the cavernous internal carotid arteries. Skull: No acute fracture or focal lesion. Sinuses/Orbits: Paranasal sinuses and mastoid air cells are clear. The orbits are  unremarkable. Other: None. IMPRESSION: No acute intracranial abnormality. Electronically Signed   By: Tish Frederickson M.D.   On: 01/08/2021 19:42   MR BRAIN W WO CONTRAST  Result Date: 01/09/2021 CLINICAL DATA:  Mental status changes of unknown cause. Vascular disease. EXAM: MRI HEAD WITHOUT AND WITH CONTRAST TECHNIQUE: Multiplanar, multiecho pulse sequences of the brain and surrounding structures were obtained without and with intravenous contrast. CONTRAST:  7.5 cc Gadavist COMPARISON:  Head CT earlier tonight.  MRI 07/17/2016. FINDINGS: Brain: Diffusion imaging does not show any acute or subacute infarction. The brainstem is normal. There are several old small vessel cerebellar infarctions. Cerebral hemispheres show mild for age chronic small-vessel ischemic changes of the white matter. No cortical or large vessel territory infarction. No mass lesion, hemorrhage, hydrocephalus or extra-axial collection. After contrast administration, no abnormal enhancement occurs. Vascular: Major vessels at the base of the brain show flow. Skull and upper cervical spine: Negative Sinuses/Orbits: Clear/normal.  Previous lens implant on the left. Other: None IMPRESSION: No acute or reversible finding. 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 Mild small vessel change of the hemispheric white matter, less than often seen at this age. Electronically Signed   By: Paulina Fusi M.D.   On: 01/09/2021 03:05   DG Chest Port 1 View  Result Date: 01/08/2021 CLINICAL DATA:  Altered mental status. EXAM: PORTABLE CHEST 1 VIEW COMPARISON:  August 02, 2020. FINDINGS: Stable cardiomegaly. No pneumothorax or pleural effusion is noted. Right lung is clear. Minimal left lingular subsegmental atelectasis or scarring is noted. Bony thorax is unremarkable. IMPRESSION: Minimal left lingular subsegmental atelectasis or scarring. Aortic Atherosclerosis (ICD10-I70.0). Electronically Signed   By: Lupita Raider M.D.   On: 01/08/2021 18:11   EEG  adult  Result Date: 01/09/2021 Charlsie Quest, [name]     01/09/2021  9:49 AM Patient Name: [name] MRN: [id number] Epilepsy Attending: Charlsie Quest Referring Physician/Provider: Dr [name] Gutter Date: 01/09/2021 Duration: 33.25 mins Patient history: 87 year old female with  history of seizures presented with altered mental status.  EEG to evaluate for seizures. Level of alertness: Awake, asleep AEDs during EEG study: Keppra Technical aspects: This EEG study was done with scalp electrodes positioned according to the 10-20 International system of electrode placement. Electrical activity was acquired at a sampling rate of  and reviewed with a high frequency filter of  and a low frequency filter of . EEG data were recorded continuously and digitally stored. Description: The posterior dominant rhythm consists of 9 Hz activity of moderate voltage (25-35 uV) seen predominantly in posterior head regions, symmetric and reactive to eye opening and eye closing. Sleep was characterized by vertex waves, sleep spindles (12 to 14 Hz), maximal frontocentral region.  EEG showed intermittent generalized 3 to 6 Hz theta-delta slowing. Physiologic photic driving was not seen during photic stimulation.  Hyperventilation was not performed.   ABNORMALITY -Intermittent slow, generalized IMPRESSION: This study is suggestive of mild diffuse encephalopathy, nonspecific etiology. No seizures or epileptiform discharges were seen throughout the recording. Priyanka Annabelle Harman   Overnight EEG with video  Result Date: 01/12/2021 Charlsie Quest, [name]     01/13/2021  9:26 AM Patient Name: [name] MRN: [id number] Epilepsy Attending: Charlsie Quest Referring Physician/Provider: Dr Ritta Slot Duration: 01/11/2021 1126 to 01/12/2021 1126  Patient history: 87 year old female with history of seizures presented with altered mental status.  EEG to evaluate for seizures.  Level of alertness: Awake, asleep  AEDs during EEG  study: Keppra  Technical aspects: This EEG study was done with scalp electrodes positioned according to the 10-20 International system of electrode placement. Electrical activity was acquired at a sampling rate of  and reviewed with a high frequency filter of  and a low frequency filter of . EEG data were recorded continuously and digitally stored.  Description: The posterior dominant rhythm consists of 9 Hz activity of moderate voltage (25-35 uV) seen predominantly in posterior head regions, symmetric and reactive to eye opening and eye closing. Sleep was characterized by vertex waves, sleep spindles (12 to 14 Hz), maximal frontocentral region.  EEG showed intermittent generalized 3 to 6 Hz theta-delta slowing.  EEG also showed 2 to 3 Hz sharply contoured delta slowing in left frontal region which at times appeared rhythmic with some evolution in morphology. Four seizures without clinical signs were recorded on 01/12/2021 at 0111, 0158, 0359 and 0713.  Concomitant EEG showed 5 to 6 Hz theta slowing admixed with sharp waves in left frontal region which gradually evolved in frequency to 2 to 3 Hz delta slowing and spread to involve right frontal region.  Average duration of seizures was about 45 seconds. Patient event button was pressed on 01/11/2021 multiple times between 1518 to 1651 for unclear reasons.  Concomitant EEG before, during and after the event did not show any EEG changes to seizure.  ABNORMALITY -Focal seizure without clinical signs, left frontal region -Intermittent rhythmic delta activity, left frontal region -Intermittent slow, generalized  IMPRESSION: This study showed four seizures without clinical signs arising from left frontal region, on 01/12/2021 at 0111, 0158, 0359 and 0713, average duration 45 seconds.  There is also evidence of cortical dysfunction in left frontal region where at times rhythmic slowing was noted which is on the ictal-interictal continuum with low potential for  seizures.  Additionally there is evidence of mild diffuse encephalopathy, nonspecific etiology. Dr. Amada Jupiter was notified. Priyanka Annabelle Harman     TODAY-DAY OF DISCHARGE:  Subjective:   [name] today has no  headache,no chest abdominal pain,no new weakness tingling or numbness, feels much better wants to go home today.   Objective:   Blood pressure (!) 141/55, pulse 68, temperature 98 F (36.7 C), temperature source Oral, resp. rate 19, height 5\' 4"  (1.626 m), weight 76.3 kg, SpO2 99 %.  Intake/Output Summary (Last 24 hours) at 01/16/2021 0849 Last data filed at 01/15/2021 2100 Gross per 24 hour  Intake 600 ml  Output 400 ml  Net 200 ml   Filed Weights   01/08/21 1756 01/08/21 2355  Weight: 76.3 kg 76.3 kg    Exam: Awake Alert, Oriented *3, No new F.N deficits, Normal affect [place].AT,PERRAL Supple Neck,No JVD, No cervical lymphadenopathy appriciated.  Symmetrical Chest wall movement, Good air movement bilaterally, CTAB RRR,No Gallops,Rubs or new Murmurs, No Parasternal Heave +ve B.Sounds, Abd Soft, Non tender, No organomegaly appriciated, No rebound -guarding or rigidity. No Cyanosis, Clubbing or edema, No new Rash or bruise   PERTINENT RADIOLOGIC STUDIES: No results found.   PERTINENT LAB RESULTS: CBC: No results for input(s): WBC, HGB, HCT, PLT in the last 72 hours. CMET CMP     Component Value Date/Time   NA 139 01/12/2021 0346   NA 134 (A) 04/25/2015 0000   K 4.1 01/12/2021 0346   CL 104 01/12/2021 0346   CO2 24 01/12/2021 0346   GLUCOSE 99 01/12/2021 0346   BUN 10 01/12/2021 0346   BUN 36 (A) 04/25/2015 0000   CREATININE 0.91 01/12/2021 0346   CALCIUM 8.9 01/12/2021 0346   PROT 6.4 (L) 01/12/2021 0346   ALBUMIN 3.1 (L) 01/12/2021 0346   AST 29 01/12/2021 0346   ALT 11 01/12/2021 0346   ALKPHOS 46 01/12/2021 0346   BILITOT 0.8 01/12/2021 0346   GFRNONAA >60 01/12/2021 0346   GFRAA 55 (L) 08/02/2020 1946    GFR Estimated Creatinine Clearance:  45.2 mL/min (by C-G formula based on SCr of 0.91 mg/dL). No results for input(s): LIPASE, AMYLASE in the last 72 hours. No results for input(s): CKTOTAL, CKMB, CKMBINDEX, TROPONINI in the last 72 hours. Invalid input(s): POCBNP No results for input(s): DDIMER in the last 72 hours. No results for input(s): HGBA1C in the last 72 hours. No results for input(s): CHOL, HDL, LDLCALC, TRIG, CHOLHDL, LDLDIRECT in the last 72 hours. No results for input(s): TSH, T4TOTAL, T3FREE, THYROIDAB in the last 72 hours.  Invalid input(s): FREET3 No results for input(s): VITAMINB12, FOLATE, FERRITIN, TIBC, IRON, RETICCTPCT in the last 72 hours. Coags: No results for input(s): INR in the last 72 hours.  Invalid input(s): PT Microbiology: Recent Results (from the past 240 hour(s))  Urine culture     Status: Abnormal   Collection Time: 01/08/21  6:56 PM   Specimen: Urine, Random  Result Value Ref Range Status   Specimen Description URINE, RANDOM  Final   Special Requests   Final    NONE Performed at [facility], [address] [place], [place] [address]    Culture 50,000 COLONIES/mL ESCHERICHIA COLI (A)  Final   Report Status 01/10/2021 FINAL  Final   Organism ID, Bacteria ESCHERICHIA COLI (A)  Final      Susceptibility   Escherichia coli - MIC*    AMPICILLIN 4 SENSITIVE Sensitive     CEFAZOLIN <=4 SENSITIVE Sensitive     CEFEPIME <=0.12 SENSITIVE Sensitive     CEFTRIAXONE <=0.25 SENSITIVE Sensitive     CIPROFLOXACIN <=0.25 SENSITIVE Sensitive     GENTAMICIN <=1 SENSITIVE Sensitive     IMIPENEM <=0.25  SENSITIVE Sensitive     NITROFURANTOIN <=16 SENSITIVE Sensitive     TRIMETH/SULFA <=20 SENSITIVE Sensitive     AMPICILLIN/SULBACTAM <=2 SENSITIVE Sensitive     PIP/TAZO <=4 SENSITIVE Sensitive     * 50,000 COLONIES/mL ESCHERICHIA COLI  Resp Panel by RT-PCR (Flu A&B, Covid) Urine, Clean Catch     Status: None   Collection Time: 01/08/21  6:56 PM   Specimen: Urine, Clean Catch;  Nasopharyngeal(NP) swabs in vial transport medium  Result Value Ref Range Status   SARS Coronavirus 2 by RT PCR NEGATIVE NEGATIVE Final    Comment: (NOTE) SARS-CoV-2 target nucleic acids are NOT DETECTED.  The SARS-CoV-2 RNA is generally detectable in upper respiratory specimens during the acute phase of infection. The lowest concentration of SARS-CoV-2 viral copies this assay can detect is 138 copies/mL. A negative result does not preclude SARS-Cov-2 infection and should not be used as the sole basis for treatment or other patient management decisions. A negative result may occur with  improper specimen collection/handling, submission of specimen other than nasopharyngeal swab, presence of viral mutation(s) within the areas targeted by this assay, and inadequate number of viral copies(<138 copies/mL). A negative result must be combined with clinical observations, patient history, and epidemiological information. The expected result is Negative.  Fact Sheet for Patients:  BloggerCourse.com  Fact Sheet for Healthcare Providers:  SeriousBroker.it  This test is no t yet approved or cleared by the [place] FDA and  has been authorized for detection and/or diagnosis of SARS-CoV-2 by FDA under an Emergency Use Authorization (EUA). This EUA will remain  in effect (meaning this test can be used) for the duration of the COVID-19 declaration under Section 564(b)(1) of the Act, 21 U.S.C.section 360bbb-3(b)(1), unless the authorization is terminated  or revoked sooner.       Influenza A by PCR NEGATIVE NEGATIVE Final   Influenza B by PCR NEGATIVE NEGATIVE Final    Comment: (NOTE) The Xpert Xpress SARS-CoV-2/FLU/RSV plus assay is intended as an aid in the diagnosis of influenza from Nasopharyngeal swab specimens and should not be used as a sole basis for treatment. Nasal washings and aspirates are unacceptable for Xpert Xpress  SARS-CoV-2/FLU/RSV testing.  Fact Sheet for Patients: BloggerCourse.com  Fact Sheet for Healthcare Providers: SeriousBroker.it  This test is not yet approved or cleared by the [place] FDA and has been authorized for detection and/or diagnosis of SARS-CoV-2 by FDA under an Emergency Use Authorization (EUA). This EUA will remain in effect (meaning this test can be used) for the duration of the COVID-19 declaration under Section 564(b)(1) of the Act, 21 U.S.C. section 360bbb-3(b)(1), unless the authorization is terminated or revoked.  Performed at [facility], [address], [place], [place] [address]   Blood Culture (routine x 2)     Status: None   Collection Time: 01/08/21  7:27 PM   Specimen: Site Not Specified; Blood  Result Value Ref Range Status   Specimen Description SITE NOT SPECIFIED  Final   Special Requests   Final    BOTTLES DRAWN AEROBIC AND ANAEROBIC Blood Culture adequate volume   Culture   Final    NO GROWTH 5 DAYS Performed at [facility], [address], [place], [place] [address]    Report Status 01/13/2021 FINAL  Final  Blood Culture (routine x 2)     Status: None   Collection Time: 01/08/21  7:55 PM   Specimen: BLOOD RIGHT ARM  Result Value Ref Range Status  Specimen Description BLOOD RIGHT ARM UPPER  Final   Special Requests   Final    BOTTLES DRAWN AEROBIC AND ANAEROBIC Blood Culture adequate volume   Culture   Final    NO GROWTH 5 DAYS Performed at [facility], [address] [place], [place] [address]    Report Status 01/13/2021 FINAL  Final  SARS CORONAVIRUS 2 (TAT 6-24 HRS) Nasopharyngeal Nasopharyngeal Swab     Status: None   Collection Time: 01/10/21  4:42 PM   Specimen: Nasopharyngeal Swab  Result Value Ref Range Status   SARS Coronavirus 2 NEGATIVE NEGATIVE Final    Comment: (NOTE) SARS-CoV-2 target nucleic acids are NOT DETECTED.  The SARS-CoV-2 RNA is  generally detectable in upper and lower respiratory specimens during the acute phase of infection. Negative results do not preclude SARS-CoV-2 infection, do not rule out co-infections with other pathogens, and should not be used as the sole basis for treatment or other patient management decisions. Negative results must be combined with clinical observations, patient history, and epidemiological information. The expected result is Negative.  Fact Sheet for Patients: HairSlick.no  Fact Sheet for Healthcare Providers: quierodirigir.com  This test is not yet approved or cleared by the [place] FDA and  has been authorized for detection and/or diagnosis of SARS-CoV-2 by FDA under an Emergency Use Authorization (EUA). This EUA will remain  in effect (meaning this test can be used) for the duration of the COVID-19 declaration under Se ction 564(b)(1) of the Act, 21 U.S.C. section 360bbb-3(b)(1), unless the authorization is terminated or revoked sooner.  Performed at [facility], [address] [place], [place] [address]   SARS CORONAVIRUS 2 (TAT 6-24 HRS) Nasopharyngeal Nasopharyngeal Swab     Status: None   Collection Time: 01/14/21  8:55 AM   Specimen: Nasopharyngeal Swab  Result Value Ref Range Status   SARS Coronavirus 2 NEGATIVE NEGATIVE Final    Comment: (NOTE) SARS-CoV-2 target nucleic acids are NOT DETECTED.  The SARS-CoV-2 RNA is generally detectable in upper and lower respiratory specimens during the acute phase of infection. Negative results do not preclude SARS-CoV-2 infection, do not rule out co-infections with other pathogens, and should not be used as the sole basis for treatment or other patient management decisions. Negative results must be combined with clinical observations, patient history, and epidemiological information. The expected result is Negative.  Fact Sheet for  Patients: HairSlick.no  Fact Sheet for Healthcare Providers: quierodirigir.com  This test is not yet approved or cleared by the [place] FDA and  has been authorized for detection and/or diagnosis of SARS-CoV-2 by FDA under an Emergency Use Authorization (EUA). This EUA will remain  in effect (meaning this test can be used) for the duration of the COVID-19 declaration under Se ction 564(b)(1) of the Act, 21 U.S.C. section 360bbb-3(b)(1), unless the authorization is terminated or revoked sooner.  Performed at [facility], [address] [place], [place] [address]     FURTHER DISCHARGE INSTRUCTIONS:  Get Medicines reviewed and adjusted: Please take all your medications with you for your next visit with your Primary [name]  Laboratory/radiological data: Please request your Primary [name] to go over all hospital tests and procedure/radiological results at the follow up, please ask your Primary [name] to get all Hospital records sent to his/her office.  In some cases, they will be blood work, cultures and biopsy results pending at the time of your discharge. Please request that your primary care M.D. goes  through all the records of your hospital data and follows up on these results.  Also Note the following: If you experience worsening of your admission symptoms, develop shortness of breath, life threatening emergency, suicidal or homicidal thoughts you must seek medical attention immediately by calling 911 or calling your [name] immediately  if symptoms less severe.  You must read complete instructions/literature along with all the possible adverse reactions/side effects for all the Medicines you take and that have been prescribed to you. Take any new Medicines after you have completely understood and accpet all the possible adverse reactions/side effects.   Do not drive when taking Pain medications or sleeping medications  (Benzodaizepines)  Do not take more than prescribed Pain, Sleep and Anxiety Medications. It is not advisable to combine anxiety,sleep and pain medications without talking with your primary care practitioner  Special Instructions: If you have smoked or chewed Tobacco  in the last 2 yrs please stop smoking, stop any regular Alcohol  and or any Recreational drug use.  Wear Seat belts while driving.  Please note: You were cared for by a hospitalist during your hospital stay. Once you are discharged, your primary care physician will handle any further medical issues. Please note that NO REFILLS for any discharge medications will be authorized once you are discharged, as it is imperative that you return to your primary care physician (or establish a relationship with a primary care physician if you do not have one) for your post hospital discharge needs so that they can reassess your need for medications and monitor your lab values.  Total Time spent coordinating discharge including counseling, education and face to face time equals 35 minutes.  SignedJeoffrey Massed 01/16/2021 8:49 AM

## 2021-01-16 NOTE — Progress Notes (Signed)
Patient discharged to [facility] via [facility].  Report called @1035  to [place], [place].

## 2021-01-16 NOTE — TOC Transition Note (Signed)
Transition of Care [facility]) - CM/SW Discharge Note   Patient Details  Name: [name] MRN: [id number] Date of Birth: 10-20-1933  [facility] ) CM/SW Contact:  Baldemar Lenis, LCSW Phone Number: 01/16/2021, 10:18 AM   Clinical Narrative:   Nurse to call report to [phone], [facility] Room 1007P.    Final next level of care: Skilled Nursing Facility Barriers to Discharge: Barriers Resolved   Patient Goals and CMS Choice Patient states their goals for this hospitalization and ongoing recovery are:: patient unable to participate in goal setting, only oriented to self CMS Medicare.gov Compare Post Acute Care list provided to:: Patient Represenative (must comment) Choice offered to / list presented to : [facility] POA / Guardian  Discharge Placement              Patient chooses bed at: [facility] Patient to be transferred to facility by: [facility] Name of family member notified: [name] Patient and family notified of of transfer: 01/16/21  Discharge Plan and Services     Post Acute Care Choice: Skilled Nursing Facility                               Social Determinants of Health (SDOH) Interventions     Readmission Risk Interventions Readmission Risk Prevention Plan 02/08/2020  Transportation Screening Complete  Medication Review [facility]) Complete  PCP or Specialist appointment within 3-5 days of discharge Complete  HRI or Home Care Consult Complete  SW Recovery Care/Counseling Consult Complete  Palliative Care Screening Not Applicable  Skilled Nursing Facility Complete  Some recent data might be hidden

## 2021-01-17 DIAGNOSIS — I5032 Chronic diastolic (congestive) heart failure: Secondary | ICD-10-CM | POA: Diagnosis not present

## 2021-01-17 DIAGNOSIS — J690 Pneumonitis due to inhalation of food and vomit: Secondary | ICD-10-CM | POA: Diagnosis not present

## 2021-01-17 DIAGNOSIS — R8271 Bacteriuria: Secondary | ICD-10-CM | POA: Diagnosis not present

## 2021-01-17 DIAGNOSIS — D6489 Other specified anemias: Secondary | ICD-10-CM | POA: Diagnosis not present

## 2021-01-17 DIAGNOSIS — M0689 Other specified rheumatoid arthritis, multiple sites: Secondary | ICD-10-CM | POA: Diagnosis not present

## 2021-01-17 DIAGNOSIS — I2510 Atherosclerotic heart disease of native coronary artery without angina pectoris: Secondary | ICD-10-CM | POA: Diagnosis not present

## 2021-01-17 DIAGNOSIS — E1159 Type 2 diabetes mellitus with other circulatory complications: Secondary | ICD-10-CM | POA: Diagnosis not present

## 2021-01-17 DIAGNOSIS — G4089 Other seizures: Secondary | ICD-10-CM | POA: Diagnosis not present

## 2021-01-17 DIAGNOSIS — G9341 Metabolic encephalopathy: Secondary | ICD-10-CM | POA: Diagnosis not present

## 2021-01-20 DIAGNOSIS — G4089 Other seizures: Secondary | ICD-10-CM | POA: Diagnosis not present

## 2021-01-20 DIAGNOSIS — R5381 Other malaise: Secondary | ICD-10-CM | POA: Diagnosis not present

## 2021-01-20 DIAGNOSIS — E1159 Type 2 diabetes mellitus with other circulatory complications: Secondary | ICD-10-CM | POA: Diagnosis not present

## 2021-01-20 DIAGNOSIS — J690 Pneumonitis due to inhalation of food and vomit: Secondary | ICD-10-CM | POA: Diagnosis not present

## 2021-01-20 DIAGNOSIS — I5032 Chronic diastolic (congestive) heart failure: Secondary | ICD-10-CM | POA: Diagnosis not present

## 2021-01-23 LAB — VITAMIN B1: Vitamin B1 (Thiamine): 95.6 nmol/L (ref 66.5–200.0)

## 2021-01-24 ENCOUNTER — Encounter: Payer: Self-pay | Admitting: Neurology

## 2021-01-24 DIAGNOSIS — N183 Chronic kidney disease, stage 3 unspecified: Secondary | ICD-10-CM | POA: Diagnosis not present

## 2021-01-24 DIAGNOSIS — G40509 Epileptic seizures related to external causes, not intractable, without status epilepticus: Secondary | ICD-10-CM | POA: Diagnosis not present

## 2021-01-24 DIAGNOSIS — G9341 Metabolic encephalopathy: Secondary | ICD-10-CM | POA: Diagnosis not present

## 2021-01-26 DIAGNOSIS — I10 Essential (primary) hypertension: Secondary | ICD-10-CM | POA: Diagnosis not present

## 2021-01-26 DIAGNOSIS — I5032 Chronic diastolic (congestive) heart failure: Secondary | ICD-10-CM | POA: Diagnosis not present

## 2021-01-26 DIAGNOSIS — I152 Hypertension secondary to endocrine disorders: Secondary | ICD-10-CM | POA: Diagnosis not present

## 2021-01-26 DIAGNOSIS — F329 Major depressive disorder, single episode, unspecified: Secondary | ICD-10-CM | POA: Diagnosis not present

## 2021-01-26 DIAGNOSIS — E119 Type 2 diabetes mellitus without complications: Secondary | ICD-10-CM | POA: Diagnosis not present

## 2021-01-26 DIAGNOSIS — N183 Chronic kidney disease, stage 3 unspecified: Secondary | ICD-10-CM | POA: Diagnosis not present

## 2021-01-26 DIAGNOSIS — K5901 Slow transit constipation: Secondary | ICD-10-CM | POA: Diagnosis not present

## 2021-01-26 DIAGNOSIS — R630 Anorexia: Secondary | ICD-10-CM | POA: Diagnosis not present

## 2021-01-26 DIAGNOSIS — I2510 Atherosclerotic heart disease of native coronary artery without angina pectoris: Secondary | ICD-10-CM | POA: Diagnosis not present

## 2021-01-26 DIAGNOSIS — M810 Age-related osteoporosis without current pathological fracture: Secondary | ICD-10-CM | POA: Diagnosis not present

## 2021-01-26 DIAGNOSIS — G4089 Other seizures: Secondary | ICD-10-CM | POA: Diagnosis not present

## 2021-01-26 DIAGNOSIS — G9341 Metabolic encephalopathy: Secondary | ICD-10-CM | POA: Diagnosis not present

## 2021-01-26 DIAGNOSIS — R2681 Unsteadiness on feet: Secondary | ICD-10-CM | POA: Diagnosis not present

## 2021-01-26 DIAGNOSIS — M6281 Muscle weakness (generalized): Secondary | ICD-10-CM | POA: Diagnosis not present

## 2021-01-30 ENCOUNTER — Other Ambulatory Visit: Payer: Self-pay | Admitting: Internal Medicine

## 2021-01-30 DIAGNOSIS — E119 Type 2 diabetes mellitus without complications: Secondary | ICD-10-CM | POA: Diagnosis not present

## 2021-01-30 DIAGNOSIS — N183 Chronic kidney disease, stage 3 unspecified: Secondary | ICD-10-CM | POA: Diagnosis not present

## 2021-01-30 DIAGNOSIS — F411 Generalized anxiety disorder: Secondary | ICD-10-CM | POA: Diagnosis not present

## 2021-01-30 DIAGNOSIS — R8271 Bacteriuria: Secondary | ICD-10-CM | POA: Diagnosis not present

## 2021-01-30 DIAGNOSIS — F329 Major depressive disorder, single episode, unspecified: Secondary | ICD-10-CM | POA: Diagnosis not present

## 2021-01-30 DIAGNOSIS — M6281 Muscle weakness (generalized): Secondary | ICD-10-CM | POA: Diagnosis not present

## 2021-01-30 DIAGNOSIS — I2510 Atherosclerotic heart disease of native coronary artery without angina pectoris: Secondary | ICD-10-CM | POA: Diagnosis not present

## 2021-01-30 DIAGNOSIS — I10 Essential (primary) hypertension: Secondary | ICD-10-CM | POA: Diagnosis not present

## 2021-01-30 DIAGNOSIS — G4089 Other seizures: Secondary | ICD-10-CM | POA: Diagnosis not present

## 2021-01-30 DIAGNOSIS — G9341 Metabolic encephalopathy: Secondary | ICD-10-CM | POA: Diagnosis not present

## 2021-01-30 DIAGNOSIS — I5032 Chronic diastolic (congestive) heart failure: Secondary | ICD-10-CM | POA: Diagnosis not present

## 2021-01-30 DIAGNOSIS — Z8616 Personal history of COVID-19: Secondary | ICD-10-CM | POA: Diagnosis not present

## 2021-01-30 DIAGNOSIS — R2681 Unsteadiness on feet: Secondary | ICD-10-CM | POA: Diagnosis not present

## 2021-01-30 DIAGNOSIS — M069 Rheumatoid arthritis, unspecified: Secondary | ICD-10-CM | POA: Diagnosis not present

## 2021-01-30 DIAGNOSIS — F331 Major depressive disorder, recurrent, moderate: Secondary | ICD-10-CM | POA: Diagnosis not present

## 2021-01-31 DIAGNOSIS — D649 Anemia, unspecified: Secondary | ICD-10-CM | POA: Diagnosis not present

## 2021-01-31 DIAGNOSIS — G9341 Metabolic encephalopathy: Secondary | ICD-10-CM | POA: Diagnosis not present

## 2021-01-31 DIAGNOSIS — I5032 Chronic diastolic (congestive) heart failure: Secondary | ICD-10-CM | POA: Diagnosis not present

## 2021-01-31 DIAGNOSIS — I152 Hypertension secondary to endocrine disorders: Secondary | ICD-10-CM | POA: Diagnosis not present

## 2021-01-31 DIAGNOSIS — G4089 Other seizures: Secondary | ICD-10-CM | POA: Diagnosis not present

## 2021-01-31 DIAGNOSIS — E1159 Type 2 diabetes mellitus with other circulatory complications: Secondary | ICD-10-CM | POA: Diagnosis not present

## 2021-01-31 DIAGNOSIS — D72829 Elevated white blood cell count, unspecified: Secondary | ICD-10-CM | POA: Diagnosis not present

## 2021-01-31 DIAGNOSIS — R630 Anorexia: Secondary | ICD-10-CM | POA: Diagnosis not present

## 2021-01-31 DIAGNOSIS — M25521 Pain in right elbow: Secondary | ICD-10-CM | POA: Diagnosis not present

## 2021-01-31 DIAGNOSIS — N390 Urinary tract infection, site not specified: Secondary | ICD-10-CM | POA: Diagnosis not present

## 2021-02-02 DIAGNOSIS — I5032 Chronic diastolic (congestive) heart failure: Secondary | ICD-10-CM | POA: Diagnosis not present

## 2021-02-02 DIAGNOSIS — F329 Major depressive disorder, single episode, unspecified: Secondary | ICD-10-CM | POA: Diagnosis not present

## 2021-02-02 DIAGNOSIS — E119 Type 2 diabetes mellitus without complications: Secondary | ICD-10-CM | POA: Diagnosis not present

## 2021-02-02 DIAGNOSIS — I2510 Atherosclerotic heart disease of native coronary artery without angina pectoris: Secondary | ICD-10-CM | POA: Diagnosis not present

## 2021-02-02 DIAGNOSIS — R2681 Unsteadiness on feet: Secondary | ICD-10-CM | POA: Diagnosis not present

## 2021-02-02 DIAGNOSIS — M6281 Muscle weakness (generalized): Secondary | ICD-10-CM | POA: Diagnosis not present

## 2021-02-02 DIAGNOSIS — I10 Essential (primary) hypertension: Secondary | ICD-10-CM | POA: Diagnosis not present

## 2021-02-02 DIAGNOSIS — N183 Chronic kidney disease, stage 3 unspecified: Secondary | ICD-10-CM | POA: Diagnosis not present

## 2021-02-02 DIAGNOSIS — Z8616 Personal history of COVID-19: Secondary | ICD-10-CM | POA: Diagnosis not present

## 2021-02-04 DIAGNOSIS — R799 Abnormal finding of blood chemistry, unspecified: Secondary | ICD-10-CM | POA: Diagnosis not present

## 2021-02-06 ENCOUNTER — Ambulatory Visit: Payer: Medicare HMO | Admitting: Neurology

## 2021-02-06 ENCOUNTER — Other Ambulatory Visit: Payer: Self-pay

## 2021-02-06 ENCOUNTER — Encounter: Payer: Self-pay | Admitting: Neurology

## 2021-02-06 VITALS — BP 136/78 | HR 74 | Resp 18 | Ht 65.0 in | Wt 146.0 lb

## 2021-02-06 DIAGNOSIS — N183 Chronic kidney disease, stage 3 unspecified: Secondary | ICD-10-CM | POA: Diagnosis not present

## 2021-02-06 DIAGNOSIS — I5032 Chronic diastolic (congestive) heart failure: Secondary | ICD-10-CM | POA: Diagnosis not present

## 2021-02-06 DIAGNOSIS — I2510 Atherosclerotic heart disease of native coronary artery without angina pectoris: Secondary | ICD-10-CM | POA: Diagnosis not present

## 2021-02-06 DIAGNOSIS — E119 Type 2 diabetes mellitus without complications: Secondary | ICD-10-CM | POA: Diagnosis not present

## 2021-02-06 DIAGNOSIS — F329 Major depressive disorder, single episode, unspecified: Secondary | ICD-10-CM | POA: Diagnosis not present

## 2021-02-06 DIAGNOSIS — R2681 Unsteadiness on feet: Secondary | ICD-10-CM | POA: Diagnosis not present

## 2021-02-06 DIAGNOSIS — G40009 Localization-related (focal) (partial) idiopathic epilepsy and epileptic syndromes with seizures of localized onset, not intractable, without status epilepticus: Secondary | ICD-10-CM

## 2021-02-06 DIAGNOSIS — I10 Essential (primary) hypertension: Secondary | ICD-10-CM | POA: Diagnosis not present

## 2021-02-06 DIAGNOSIS — Z8616 Personal history of COVID-19: Secondary | ICD-10-CM | POA: Diagnosis not present

## 2021-02-06 DIAGNOSIS — M6281 Muscle weakness (generalized): Secondary | ICD-10-CM | POA: Diagnosis not present

## 2021-02-06 NOTE — Patient Instructions (Signed)
1. Continue Depakote 500mg  twice a day  2. Continue with pain management, physical therapy  3. Follow-up in 3 months, call for any changes   Seizure Precautions: 1. If medication has been prescribed for you to prevent seizures, take it exactly as directed.  Do not stop taking the medicine without talking to your doctor first, even if you have not had a seizure in a long time.   2. Avoid activities in which a seizure would cause danger to yourself or to others.  Don't operate dangerous machinery, swim alone, or climb in high or dangerous places, such as on ladders, roofs, or girders.  Do not drive unless your doctor says you may.  3. If you have any warning that you may have a seizure, lay down in a safe place where you can't hurt yourself.    4.  No driving for 6 months from last seizure, as per [facility].   Please refer to the following link on the [employer]ile[employer] of [employer]'s website for more information: http://www.epilepsyfoundation.org/answerplace/Social/driving/drivingu.cfm   5.  Maintain good sleep hygiene.  6.  Contact your doctor if you have any problems that may be related to the medicine you are taking.  7.  Call 911 and bring the patient back to the ED if:        A.  The seizure lasts longer than 5 minutes.       B.  The patient doesn't awaken shortly after the seizure  C.  The patient has new problems such as difficulty seeing, speaking or moving  D.  The patient was injured during the seizure  E.  The patient has a temperature over 102 F (39C)  F.  The patient vomited and now is having trouble breathing

## 2021-02-06 NOTE — Progress Notes (Signed)
NEUROLOGY CONSULTATION NOTE  [name] MRN: [id number] DOB: [birth year]  Referring provider: Dr. Jeoffrey Massed Primary care provider: Dr. Jacinta Shoe  Reason for consult:  seizure  Dear Dr Jerral Ralph:  Thank you for your kind referral of [name] for consultation of the above symptoms. Although her history is well known to you, please allow me to reiterate it for the purpose of our medical record. The patient was accompanied to the clinic by her daughter [name] who also provides collateral information. Records and images were personally reviewed where available.   HISTORY OF PRESENT ILLNESS: This is an 87 year old right-handed woman with a history of hypertension, hyperlipidemia, diabetes, TIA, CAD, CHF, polyarthritis with chronic pain, presenting for evaluation of seizures. Her daughter [name] is present to provide additional information. Records from her prior hospitalizations were reviewed. The first seizure occurred in 2017, she called to her daughter thinking her sugar was low. When her daughter returned to check on her after giving orange juice, she found her mother on the bed, unresponsive, with mouth twitching. She was unresponsive and stiff. EMS found normal glucose levels, she was lethargic and confused. MRI brain no acute changes. Her routine EEG was normal. She was discharged home on Levetiracetam 500mg  BID. Over the years, describes her seizures as staring/unresponsiveness, no convulsions. Her family reported drowsiness to her PCP, and dose was reduced to 500mg  qhs. On 01/08/21, she was found by family confused, naked, sitting on her stool with bowel incontinence. At baseline, she lives with her husband with dementia and her son. Her children rotate to check on them. She could usually communicate and move around using her rollator, independent with dressing and bathing. For the past 3-4 years, she has complained of not having energy, worse recently. She sits  around the house all day. Her daughter reported she would occasionally miss her medications. Her Levetiracetam bottle was empty, unclear when she ran out. She had an MRI brain which did not show any acute changes, there was mild chronic microvascular disease. Her initial EEG showed diffuse slowing, however subsequent prolonged EEG captured 4 electrographic seizures arising from the left frontal region. No clinical changes were seen, she remained confused. Due to drowsiness on Levetiracetam, she was discharged home on Depakote 500mg  BID. She has been at [facility] rehab for the past 3 weeks with no similar significant confusion. There was an episode last Monday where she was a little confused, weak, sensitive to touch anywhere. She is in a significant amount of pain today, unable to move herself up on her wheelchair, reporting pain in her knees. She is sleepy when not stimulation, reports that some days she is talkative and awake. She used to have Voltaren gel for the arthritis pain but [facility] does not think she has used it since in rehab. She has arthritis pain "everywhere, legs are the worst." Sleep is good. Her son fixes meals. Tuesday notes a lifetime history of depression, "unhappy with her life."   Epilepsy Risk Factors:  She had a normal birth and early development.  There is no history of febrile convulsions, CNS infections such as meningitis/encephalitis, significant traumatic brain injury, neurosurgical procedures, or family history of seizures.    PAST MEDICAL HISTORY: Past Medical History:  Diagnosis Date  . Anemia    iron deficiency  . Aneurysm, thoracic aortic (HCC)   . Anxiety   . B12 deficiency   . CAD (coronary artery disease)    s/p stenting of LAD 1999-  cath 5-08 EF normal LAD 30-40% restenosis. D1 50% D2 80% LCX & RCA minimal plaque  . Chronic back pain   . CKD (chronic kidney disease) stage 3, GFR 30-59 ml/min (HCC) 08/29/2020  . Constipation   . Depression   . Diabetes  mellitus   . DVT (deep venous thrombosis) (HCC)   . GERD (gastroesophageal reflux disease)   . Gout   . HTN (hypertension)   . Hyperlipemia   . Obesity   . Osteoporosis   . Pancreatitis   . Polyarthritis    DJD/ possible PMR  . Renal insufficiency    Cr 1.2-1.3  . Seizures (HCC)   . Tinnitus   . Urinary frequency   . Vertigo   . Vitamin D deficiency     PAST SURGICAL HISTORY: Past Surgical History:  Procedure Laterality Date  . ABDOMINAL HYSTERECTOMY    . CARDIAC CATHETERIZATION  2008   L main 20%, LAD stent patent, D1 50%, D2 80% (small), RCA 20%, EF 55-60%  . CHOLECYSTECTOMY    . CORONARY ANGIOPLASTY WITH STENT PLACEMENT  1999   LAD stent  . CORONARY STENT INTERVENTION N/A 10/16/2019   Procedure: CORONARY STENT INTERVENTION;  Surgeon: Marykay Lex, MD;  Location: [facility] INVASIVE CV LAB;  Service: Cardiovascular;  Laterality: N/A;  . HEMORRHOID SURGERY    . LAPAROSCOPIC LYSIS OF ADHESIONS N/A 03/24/2015   Procedure: LAPAROSCOPIC LYSIS OF ADHESIONS;  Surgeon: Luretha Murphy, MD;  Location: [facility] ORS;  Service: General;  Laterality: N/A;  . LAPAROSCOPY N/A 03/24/2015   Procedure: LAPAROSCOPY DIAGNOSTIC;  Surgeon: Luretha Murphy, MD;  Location: [facility] ORS;  Service: General;  Laterality: N/A;  . LAPAROTOMY N/A 03/24/2015   Procedure: LAPAROTOMY with decompression of bowel;  Surgeon: Luretha Murphy, MD;  Location: [facility] ORS;  Service: General;  Laterality: N/A;  . LEFT HEART CATH AND CORONARY ANGIOGRAPHY N/A 10/16/2019   Procedure: LEFT HEART CATH AND CORONARY ANGIOGRAPHY;  Surgeon: Marykay Lex, MD;  Location: [facility] INVASIVE CV LAB;  Service: Cardiovascular;  Laterality: N/A;  . TUBAL LIGATION      MEDICATIONS: Current Outpatient Medications on File Prior to Visit  Medication Sig Dispense Refill  . acetaminophen (TYLENOL) 325 MG tablet Take 2 tablets (650 mg total) by mouth every 6 (six) hours as needed for mild pain, headache or moderate pain.    [name] amLODipine (NORVASC) 5 MG tablet  Take 1 tablet (5 mg total) by mouth daily.    [name] aspirin EC 81 MG tablet Take 1 tablet (81 mg total) by mouth daily. Swallow whole. (Patient not taking: No sig reported) 90 tablet 3  . Blood Glucose Calibration (ACCU-CHEK AVIVA) SOLN Use as directed (Patient taking differently: 1 each by Other route See admin instructions. Use as directed per physician) 3 each 1  . brimonidine (ALPHAGAN) 0.2 % ophthalmic solution Place 1 drop into both eyes 2 (two) times daily.    . carvedilol (COREG) 12.5 MG tablet Take 1 tablet (12.5 mg total) by mouth 2 (two) times daily with a meal.    . cholecalciferol (VITAMIN D) 25 MCG (1000 UNIT) tablet Take 1,000 Units by mouth daily.    . citalopram (CELEXA) 10 MG tablet Take 1 tablet (10 mg total) by mouth daily. 90 tablet 3  . diclofenac Sodium (VOLTAREN) 1 % GEL Apply 1 application topically 2 (two) times daily as needed (pain).    [name] divalproex (DEPAKOTE) 500 MG DR tablet Take 1 tablet (500 mg total) by mouth every 12 (twelve) hours.    [name]  feeding supplement, GLUCERNA SHAKE, (GLUCERNA SHAKE) LIQD Take 237 mLs by mouth 3 (three) times daily between meals. (Patient taking differently: Take 237 mLs by mouth daily with breakfast.)  0  . glucose blood (ONETOUCH VERIO) test strip Use as instructed (Patient taking differently: 1 each by Other route See admin instructions. Use as instructed for blood sugar) 300 each 3  . Menthol, Topical Analgesic, (BIOFREEZE EX) Apply 1 application topically 2 (two) times daily as needed (pain).    . methotrexate 2.5 MG tablet Take 10 mg by mouth once a week. For arthritis    . ondansetron (ZOFRAN) 4 MG tablet TAKE 1 TABLET BY MOUTH TWICE DAILY AS NEEDED FOR NAUSEA OR VOMITING 20 tablet 0  . ONETOUCH DELICA LANCETS FINE MISC 1 Device by Does not apply route daily as needed. (Patient taking differently: 1 Device by Does not apply route daily as needed (blood glucose).) 100 each 3  . pantoprazole (PROTONIX) 40 MG tablet Take 1 tablet (40 mg total)  by mouth daily. (Patient not taking: No sig reported) 30 tablet 0  . rosuvastatin (CRESTOR) 20 MG tablet Take 1 tablet (20 mg total) by mouth daily. 90 tablet 3  . thiamine 100 MG tablet Take 1 tablet (100 mg total) by mouth daily.     No current facility-administered medications on file prior to visit.    ALLERGIES: Allergies  Allergen Reactions  . Ativan [Lorazepam] Other (See Comments)    Very confused  . Tramadol Hcl Anxiety and Rash    Headache  . Amlodipine Besylate Other (See Comments)     dizzy  . Atenolol Other (See Comments)    Fatigue  . Benazepril Cough  . Benicar [Olmesartan Medoxomil] Other (See Comments)    HEADACHE  . Cozaar Nausea And Vomiting  . Hydralazine Other (See Comments)    Hair loss  . Hydrochlorothiazide W-Triamterene Other (See Comments)     dizzy  . Hydrocodone Other (See Comments)    HEADACHE  . Hydroxyzine Pamoate Other (See Comments)    Per MAR  . Iodine Other (See Comments)    Per MAR  . Lisinopril Other (See Comments) and Cough    Tired & fatigue  . Peach Flavor Itching  . Penicillins Itching    Has patient had a PCN reaction causing immediate rash, facial/tongue/throat swelling, SOB or lightheadedness with hypotension: No Has patient had a PCN reaction causing severe rash involving mucus membranes or skin necrosis: No Has patient had a PCN reaction that required hospitalization No Has patient had a PCN reaction occurring within the last 10 years: Yes If all of the above answers are "NO", then may proceed with Cephalosporin use.  tolerates cephalosporins OK   . Pravastatin Other (See Comments)    Myalgias-muscle pain  . Prednisolone Nausea Only and Other (See Comments)    Upset stomach  . Strawberry Flavor Itching  . Tramadol Hcl Other (See Comments)    headache  . Benadryl [Diphenhydramine] Itching and Palpitations    FAMILY HISTORY: Family History  Adopted: Yes  Problem Relation Age of Onset  . Diabetes Mother   .  Hypertension Father     SOCIAL HISTORY: Social History   Socioeconomic History  . Marital status: Married    Spouse name: Not on file  . Number of children: Not on file  . Years of education: Not on file  . Highest education level: Not on file  Occupational History  . Occupation: Retired  Tobacco Use  . Smoking status:  Never Smoker  . Smokeless tobacco: Never Used  Substance and Sexual Activity  . Alcohol use: No  . Drug use: No  . Sexual activity: Not Currently  Other Topics Concern  . Not on file  Social History Narrative   She lives in [place] with her husband   She is retired   No tobacco or alcohol use.   Social Determinants of Health   Financial Resource Strain: Not on file  Food Insecurity: Not on file  Transportation Needs: Not on file  Physical Activity: Not on file  Stress: Not on file  Social Connections: Not on file  Intimate Partner Violence: Not on file     PHYSICAL EXAM: Vitals:   02/06/21 1249  BP: 136/78  Pulse: 74  Resp: 18  SpO2: 99%   General: Sitting in wheelchair, uncomfortable due to pain in legs, screaming out in pain when legs are passively moved  Head:  Normocephalic/atraumatic Skin/Extremities: No rash, no edema Neurological Exam: Mental status: drowsy, arousable to questions, reporting pain,able to follow simple commands.  Cranial nerves: CN I: not tested CN II: pupils equal, round and reactive to light, visual fields intact CN III, IV, VI:  full range of motion, no nystagmus, no ptosis CN VII: upper and lower face symmetric CN VIII: hearing intact to conversation Bulk & Tone: normal, no fasciculations. Motor: unable to do formal muscle testing due to diffuse pain, at least 3/5 antigravity throughout Cerebellar: no incoordination on finger to nose testing Gait: not tested Tremor: none   IMPRESSION: This is an 87 year old right-handed woman with a history of hypertension, hyperlipidemia, diabetes, TIA, CAD, CHF,  polyarthritis with chronic pain, presenting for evaluation of seizures. Recent hospitalization captured 4 electrographic seizures arising from the left frontal region. MRI brain no acute changes. She is on Depakote  BID with occasional drowsiness, we discussed option to use extended-release formulation, her daughter would like to stay the course for now. Exam is limited due to significant pain with passive movements. Continue Pain management, physical therapy. She does not drive. Follow-up in 3 months, call for any changes.   Thank you for allowing me to participate in the care of this patient. Please do not hesitate to call for any questions or concerns.   Patrcia Dolly, M.D.  CC: Dr. Jerral Ralph, Dr. Gerrit Heck

## 2021-02-07 DIAGNOSIS — M2550 Pain in unspecified joint: Secondary | ICD-10-CM | POA: Diagnosis not present

## 2021-02-07 DIAGNOSIS — G4089 Other seizures: Secondary | ICD-10-CM | POA: Diagnosis not present

## 2021-02-07 DIAGNOSIS — I5032 Chronic diastolic (congestive) heart failure: Secondary | ICD-10-CM | POA: Diagnosis not present

## 2021-02-07 DIAGNOSIS — K5909 Other constipation: Secondary | ICD-10-CM | POA: Diagnosis not present

## 2021-02-07 DIAGNOSIS — D72829 Elevated white blood cell count, unspecified: Secondary | ICD-10-CM | POA: Diagnosis not present

## 2021-02-07 DIAGNOSIS — K6289 Other specified diseases of anus and rectum: Secondary | ICD-10-CM | POA: Diagnosis not present

## 2021-02-09 ENCOUNTER — Ambulatory Visit: Payer: Medicare HMO | Admitting: Cardiology

## 2021-02-09 DIAGNOSIS — M1990 Unspecified osteoarthritis, unspecified site: Secondary | ICD-10-CM | POA: Diagnosis not present

## 2021-02-09 DIAGNOSIS — M79643 Pain in unspecified hand: Secondary | ICD-10-CM | POA: Diagnosis not present

## 2021-02-09 DIAGNOSIS — M0579 Rheumatoid arthritis with rheumatoid factor of multiple sites without organ or systems involvement: Secondary | ICD-10-CM | POA: Diagnosis not present

## 2021-02-09 DIAGNOSIS — Z79899 Other long term (current) drug therapy: Secondary | ICD-10-CM | POA: Diagnosis not present

## 2021-02-10 DIAGNOSIS — Z13228 Encounter for screening for other metabolic disorders: Secondary | ICD-10-CM | POA: Diagnosis not present

## 2021-02-10 DIAGNOSIS — I5031 Acute diastolic (congestive) heart failure: Secondary | ICD-10-CM | POA: Diagnosis not present

## 2021-02-10 DIAGNOSIS — J9811 Atelectasis: Secondary | ICD-10-CM | POA: Diagnosis not present

## 2021-02-10 DIAGNOSIS — T25221S Burn of second degree of right foot, sequela: Secondary | ICD-10-CM | POA: Diagnosis not present

## 2021-02-10 DIAGNOSIS — E118 Type 2 diabetes mellitus with unspecified complications: Secondary | ICD-10-CM | POA: Diagnosis not present

## 2021-02-10 DIAGNOSIS — D61818 Other pancytopenia: Secondary | ICD-10-CM | POA: Diagnosis not present

## 2021-02-10 DIAGNOSIS — R630 Anorexia: Secondary | ICD-10-CM | POA: Diagnosis not present

## 2021-02-10 DIAGNOSIS — R7981 Abnormal blood-gas level: Secondary | ICD-10-CM | POA: Diagnosis not present

## 2021-02-10 DIAGNOSIS — D539 Nutritional anemia, unspecified: Secondary | ICD-10-CM | POA: Diagnosis present

## 2021-02-10 DIAGNOSIS — K5901 Slow transit constipation: Secondary | ICD-10-CM | POA: Diagnosis not present

## 2021-02-10 DIAGNOSIS — N183 Chronic kidney disease, stage 3 unspecified: Secondary | ICD-10-CM | POA: Diagnosis not present

## 2021-02-10 DIAGNOSIS — K6289 Other specified diseases of anus and rectum: Secondary | ICD-10-CM | POA: Diagnosis not present

## 2021-02-10 DIAGNOSIS — R41841 Cognitive communication deficit: Secondary | ICD-10-CM | POA: Diagnosis not present

## 2021-02-10 DIAGNOSIS — G4089 Other seizures: Secondary | ICD-10-CM | POA: Diagnosis not present

## 2021-02-10 DIAGNOSIS — I5032 Chronic diastolic (congestive) heart failure: Secondary | ICD-10-CM | POA: Diagnosis not present

## 2021-02-10 DIAGNOSIS — I110 Hypertensive heart disease with heart failure: Secondary | ICD-10-CM | POA: Diagnosis not present

## 2021-02-10 DIAGNOSIS — N83202 Unspecified ovarian cyst, left side: Secondary | ICD-10-CM | POA: Diagnosis not present

## 2021-02-10 DIAGNOSIS — D72819 Decreased white blood cell count, unspecified: Secondary | ICD-10-CM | POA: Diagnosis not present

## 2021-02-10 DIAGNOSIS — F329 Major depressive disorder, single episode, unspecified: Secondary | ICD-10-CM | POA: Diagnosis not present

## 2021-02-10 DIAGNOSIS — Z9114 Patient's other noncompliance with medication regimen: Secondary | ICD-10-CM | POA: Diagnosis not present

## 2021-02-10 DIAGNOSIS — R2681 Unsteadiness on feet: Secondary | ICD-10-CM | POA: Diagnosis not present

## 2021-02-10 DIAGNOSIS — M069 Rheumatoid arthritis, unspecified: Secondary | ICD-10-CM | POA: Diagnosis not present

## 2021-02-10 DIAGNOSIS — I2510 Atherosclerotic heart disease of native coronary artery without angina pectoris: Secondary | ICD-10-CM | POA: Diagnosis not present

## 2021-02-10 DIAGNOSIS — R5383 Other fatigue: Secondary | ICD-10-CM | POA: Diagnosis not present

## 2021-02-10 DIAGNOSIS — I517 Cardiomegaly: Secondary | ICD-10-CM | POA: Diagnosis not present

## 2021-02-10 DIAGNOSIS — Z955 Presence of coronary angioplasty implant and graft: Secondary | ICD-10-CM | POA: Diagnosis not present

## 2021-02-10 DIAGNOSIS — I10 Essential (primary) hypertension: Secondary | ICD-10-CM | POA: Diagnosis not present

## 2021-02-10 DIAGNOSIS — R5381 Other malaise: Secondary | ICD-10-CM | POA: Diagnosis not present

## 2021-02-10 DIAGNOSIS — G459 Transient cerebral ischemic attack, unspecified: Secondary | ICD-10-CM | POA: Diagnosis not present

## 2021-02-10 DIAGNOSIS — K8070 Calculus of gallbladder and bile duct without cholecystitis without obstruction: Secondary | ICD-10-CM | POA: Diagnosis not present

## 2021-02-10 DIAGNOSIS — I152 Hypertension secondary to endocrine disorders: Secondary | ICD-10-CM | POA: Diagnosis not present

## 2021-02-10 DIAGNOSIS — K5909 Other constipation: Secondary | ICD-10-CM | POA: Diagnosis not present

## 2021-02-10 DIAGNOSIS — Z79899 Other long term (current) drug therapy: Secondary | ICD-10-CM | POA: Diagnosis not present

## 2021-02-10 DIAGNOSIS — K838 Other specified diseases of biliary tract: Secondary | ICD-10-CM | POA: Diagnosis not present

## 2021-02-10 DIAGNOSIS — I130 Hypertensive heart and chronic kidney disease with heart failure and stage 1 through stage 4 chronic kidney disease, or unspecified chronic kidney disease: Secondary | ICD-10-CM | POA: Diagnosis not present

## 2021-02-10 DIAGNOSIS — R2689 Other abnormalities of gait and mobility: Secondary | ICD-10-CM | POA: Diagnosis not present

## 2021-02-10 DIAGNOSIS — E611 Iron deficiency: Secondary | ICD-10-CM | POA: Diagnosis not present

## 2021-02-10 DIAGNOSIS — R498 Other voice and resonance disorders: Secondary | ICD-10-CM | POA: Diagnosis not present

## 2021-02-10 DIAGNOSIS — Z8673 Personal history of transient ischemic attack (TIA), and cerebral infarction without residual deficits: Secondary | ICD-10-CM | POA: Diagnosis not present

## 2021-02-10 DIAGNOSIS — D519 Vitamin B12 deficiency anemia, unspecified: Secondary | ICD-10-CM | POA: Diagnosis not present

## 2021-02-10 DIAGNOSIS — R531 Weakness: Secondary | ICD-10-CM | POA: Diagnosis present

## 2021-02-10 DIAGNOSIS — M6281 Muscle weakness (generalized): Secondary | ICD-10-CM | POA: Diagnosis not present

## 2021-02-10 DIAGNOSIS — K5900 Constipation, unspecified: Secondary | ICD-10-CM | POA: Diagnosis not present

## 2021-02-10 DIAGNOSIS — G9341 Metabolic encephalopathy: Secondary | ICD-10-CM | POA: Diagnosis not present

## 2021-02-10 DIAGNOSIS — Z7982 Long term (current) use of aspirin: Secondary | ICD-10-CM | POA: Diagnosis not present

## 2021-02-10 DIAGNOSIS — E785 Hyperlipidemia, unspecified: Secondary | ICD-10-CM | POA: Diagnosis not present

## 2021-02-10 DIAGNOSIS — M1990 Unspecified osteoarthritis, unspecified site: Secondary | ICD-10-CM | POA: Diagnosis not present

## 2021-02-10 DIAGNOSIS — R1312 Dysphagia, oropharyngeal phase: Secondary | ICD-10-CM | POA: Diagnosis not present

## 2021-02-10 DIAGNOSIS — R195 Other fecal abnormalities: Secondary | ICD-10-CM | POA: Diagnosis not present

## 2021-02-10 DIAGNOSIS — R799 Abnormal finding of blood chemistry, unspecified: Secondary | ICD-10-CM | POA: Diagnosis not present

## 2021-02-10 DIAGNOSIS — Z8616 Personal history of COVID-19: Secondary | ICD-10-CM | POA: Diagnosis not present

## 2021-02-10 DIAGNOSIS — E119 Type 2 diabetes mellitus without complications: Secondary | ICD-10-CM | POA: Diagnosis not present

## 2021-02-10 DIAGNOSIS — D696 Thrombocytopenia, unspecified: Secondary | ICD-10-CM | POA: Diagnosis not present

## 2021-02-10 DIAGNOSIS — D649 Anemia, unspecified: Secondary | ICD-10-CM | POA: Diagnosis not present

## 2021-02-10 DIAGNOSIS — K649 Unspecified hemorrhoids: Secondary | ICD-10-CM | POA: Diagnosis not present

## 2021-02-10 DIAGNOSIS — D638 Anemia in other chronic diseases classified elsewhere: Secondary | ICD-10-CM | POA: Diagnosis not present

## 2021-02-10 DIAGNOSIS — G40909 Epilepsy, unspecified, not intractable, without status epilepticus: Secondary | ICD-10-CM | POA: Diagnosis not present

## 2021-02-10 DIAGNOSIS — E1122 Type 2 diabetes mellitus with diabetic chronic kidney disease: Secondary | ICD-10-CM | POA: Diagnosis not present

## 2021-02-10 DIAGNOSIS — K8050 Calculus of bile duct without cholangitis or cholecystitis without obstruction: Secondary | ICD-10-CM | POA: Diagnosis not present

## 2021-02-11 DIAGNOSIS — R799 Abnormal finding of blood chemistry, unspecified: Secondary | ICD-10-CM | POA: Diagnosis not present

## 2021-02-11 DIAGNOSIS — D519 Vitamin B12 deficiency anemia, unspecified: Secondary | ICD-10-CM | POA: Diagnosis not present

## 2021-02-11 DIAGNOSIS — Z13228 Encounter for screening for other metabolic disorders: Secondary | ICD-10-CM | POA: Diagnosis not present

## 2021-02-11 DIAGNOSIS — E611 Iron deficiency: Secondary | ICD-10-CM | POA: Diagnosis not present

## 2021-02-13 DIAGNOSIS — I5032 Chronic diastolic (congestive) heart failure: Secondary | ICD-10-CM | POA: Diagnosis not present

## 2021-02-13 DIAGNOSIS — N183 Chronic kidney disease, stage 3 unspecified: Secondary | ICD-10-CM | POA: Diagnosis not present

## 2021-02-13 DIAGNOSIS — Z8616 Personal history of COVID-19: Secondary | ICD-10-CM | POA: Diagnosis not present

## 2021-02-13 DIAGNOSIS — M6281 Muscle weakness (generalized): Secondary | ICD-10-CM | POA: Diagnosis not present

## 2021-02-13 DIAGNOSIS — R2681 Unsteadiness on feet: Secondary | ICD-10-CM | POA: Diagnosis not present

## 2021-02-13 DIAGNOSIS — F329 Major depressive disorder, single episode, unspecified: Secondary | ICD-10-CM | POA: Diagnosis not present

## 2021-02-13 DIAGNOSIS — E119 Type 2 diabetes mellitus without complications: Secondary | ICD-10-CM | POA: Diagnosis not present

## 2021-02-13 DIAGNOSIS — I2510 Atherosclerotic heart disease of native coronary artery without angina pectoris: Secondary | ICD-10-CM | POA: Diagnosis not present

## 2021-02-13 DIAGNOSIS — I10 Essential (primary) hypertension: Secondary | ICD-10-CM | POA: Diagnosis not present

## 2021-02-14 DIAGNOSIS — Z8673 Personal history of transient ischemic attack (TIA), and cerebral infarction without residual deficits: Secondary | ICD-10-CM | POA: Diagnosis not present

## 2021-02-14 DIAGNOSIS — Z9114 Patient's other noncompliance with medication regimen: Secondary | ICD-10-CM | POA: Diagnosis not present

## 2021-02-20 DIAGNOSIS — N183 Chronic kidney disease, stage 3 unspecified: Secondary | ICD-10-CM | POA: Diagnosis not present

## 2021-02-20 DIAGNOSIS — I10 Essential (primary) hypertension: Secondary | ICD-10-CM | POA: Diagnosis not present

## 2021-02-20 DIAGNOSIS — M6281 Muscle weakness (generalized): Secondary | ICD-10-CM | POA: Diagnosis not present

## 2021-02-20 DIAGNOSIS — R2681 Unsteadiness on feet: Secondary | ICD-10-CM | POA: Diagnosis not present

## 2021-02-20 DIAGNOSIS — I2510 Atherosclerotic heart disease of native coronary artery without angina pectoris: Secondary | ICD-10-CM | POA: Diagnosis not present

## 2021-02-20 DIAGNOSIS — I5032 Chronic diastolic (congestive) heart failure: Secondary | ICD-10-CM | POA: Diagnosis not present

## 2021-02-20 DIAGNOSIS — E119 Type 2 diabetes mellitus without complications: Secondary | ICD-10-CM | POA: Diagnosis not present

## 2021-02-20 DIAGNOSIS — F329 Major depressive disorder, single episode, unspecified: Secondary | ICD-10-CM | POA: Diagnosis not present

## 2021-02-20 DIAGNOSIS — Z8616 Personal history of COVID-19: Secondary | ICD-10-CM | POA: Diagnosis not present

## 2021-02-23 DIAGNOSIS — R630 Anorexia: Secondary | ICD-10-CM | POA: Diagnosis not present

## 2021-02-23 DIAGNOSIS — K5901 Slow transit constipation: Secondary | ICD-10-CM | POA: Diagnosis not present

## 2021-02-23 DIAGNOSIS — I5032 Chronic diastolic (congestive) heart failure: Secondary | ICD-10-CM | POA: Diagnosis not present

## 2021-02-23 DIAGNOSIS — I152 Hypertension secondary to endocrine disorders: Secondary | ICD-10-CM | POA: Diagnosis not present

## 2021-02-27 DIAGNOSIS — N183 Chronic kidney disease, stage 3 unspecified: Secondary | ICD-10-CM | POA: Diagnosis not present

## 2021-02-27 DIAGNOSIS — I10 Essential (primary) hypertension: Secondary | ICD-10-CM | POA: Diagnosis not present

## 2021-02-27 DIAGNOSIS — E119 Type 2 diabetes mellitus without complications: Secondary | ICD-10-CM | POA: Diagnosis not present

## 2021-02-27 DIAGNOSIS — I2510 Atherosclerotic heart disease of native coronary artery without angina pectoris: Secondary | ICD-10-CM | POA: Diagnosis not present

## 2021-02-27 DIAGNOSIS — F329 Major depressive disorder, single episode, unspecified: Secondary | ICD-10-CM | POA: Diagnosis not present

## 2021-02-27 DIAGNOSIS — M6281 Muscle weakness (generalized): Secondary | ICD-10-CM | POA: Diagnosis not present

## 2021-02-27 DIAGNOSIS — I5032 Chronic diastolic (congestive) heart failure: Secondary | ICD-10-CM | POA: Diagnosis not present

## 2021-02-27 DIAGNOSIS — Z8616 Personal history of COVID-19: Secondary | ICD-10-CM | POA: Diagnosis not present

## 2021-02-27 DIAGNOSIS — R2681 Unsteadiness on feet: Secondary | ICD-10-CM | POA: Diagnosis not present

## 2021-03-02 DIAGNOSIS — E119 Type 2 diabetes mellitus without complications: Secondary | ICD-10-CM | POA: Diagnosis not present

## 2021-03-02 DIAGNOSIS — N183 Chronic kidney disease, stage 3 unspecified: Secondary | ICD-10-CM | POA: Diagnosis not present

## 2021-03-02 DIAGNOSIS — F329 Major depressive disorder, single episode, unspecified: Secondary | ICD-10-CM | POA: Diagnosis not present

## 2021-03-02 DIAGNOSIS — R2681 Unsteadiness on feet: Secondary | ICD-10-CM | POA: Diagnosis not present

## 2021-03-02 DIAGNOSIS — I10 Essential (primary) hypertension: Secondary | ICD-10-CM | POA: Diagnosis not present

## 2021-03-02 DIAGNOSIS — I5032 Chronic diastolic (congestive) heart failure: Secondary | ICD-10-CM | POA: Diagnosis not present

## 2021-03-02 DIAGNOSIS — I2510 Atherosclerotic heart disease of native coronary artery without angina pectoris: Secondary | ICD-10-CM | POA: Diagnosis not present

## 2021-03-02 DIAGNOSIS — Z8616 Personal history of COVID-19: Secondary | ICD-10-CM | POA: Diagnosis not present

## 2021-03-02 DIAGNOSIS — M6281 Muscle weakness (generalized): Secondary | ICD-10-CM | POA: Diagnosis not present

## 2021-03-06 DIAGNOSIS — I2510 Atherosclerotic heart disease of native coronary artery without angina pectoris: Secondary | ICD-10-CM | POA: Diagnosis not present

## 2021-03-06 DIAGNOSIS — I5032 Chronic diastolic (congestive) heart failure: Secondary | ICD-10-CM | POA: Diagnosis not present

## 2021-03-06 DIAGNOSIS — I10 Essential (primary) hypertension: Secondary | ICD-10-CM | POA: Diagnosis not present

## 2021-03-06 DIAGNOSIS — N183 Chronic kidney disease, stage 3 unspecified: Secondary | ICD-10-CM | POA: Diagnosis not present

## 2021-03-06 DIAGNOSIS — F329 Major depressive disorder, single episode, unspecified: Secondary | ICD-10-CM | POA: Diagnosis not present

## 2021-03-06 DIAGNOSIS — E119 Type 2 diabetes mellitus without complications: Secondary | ICD-10-CM | POA: Diagnosis not present

## 2021-03-06 DIAGNOSIS — Z8616 Personal history of COVID-19: Secondary | ICD-10-CM | POA: Diagnosis not present

## 2021-03-06 DIAGNOSIS — M6281 Muscle weakness (generalized): Secondary | ICD-10-CM | POA: Diagnosis not present

## 2021-03-06 DIAGNOSIS — R2681 Unsteadiness on feet: Secondary | ICD-10-CM | POA: Diagnosis not present

## 2021-03-09 DIAGNOSIS — R2681 Unsteadiness on feet: Secondary | ICD-10-CM | POA: Diagnosis not present

## 2021-03-09 DIAGNOSIS — M6281 Muscle weakness (generalized): Secondary | ICD-10-CM | POA: Diagnosis not present

## 2021-03-09 DIAGNOSIS — I2510 Atherosclerotic heart disease of native coronary artery without angina pectoris: Secondary | ICD-10-CM | POA: Diagnosis not present

## 2021-03-09 DIAGNOSIS — K5901 Slow transit constipation: Secondary | ICD-10-CM | POA: Diagnosis not present

## 2021-03-09 DIAGNOSIS — F329 Major depressive disorder, single episode, unspecified: Secondary | ICD-10-CM | POA: Diagnosis not present

## 2021-03-09 DIAGNOSIS — G40909 Epilepsy, unspecified, not intractable, without status epilepticus: Secondary | ICD-10-CM | POA: Diagnosis not present

## 2021-03-09 DIAGNOSIS — N183 Chronic kidney disease, stage 3 unspecified: Secondary | ICD-10-CM | POA: Diagnosis not present

## 2021-03-09 DIAGNOSIS — I5032 Chronic diastolic (congestive) heart failure: Secondary | ICD-10-CM | POA: Diagnosis not present

## 2021-03-09 DIAGNOSIS — I10 Essential (primary) hypertension: Secondary | ICD-10-CM | POA: Diagnosis not present

## 2021-03-09 DIAGNOSIS — E119 Type 2 diabetes mellitus without complications: Secondary | ICD-10-CM | POA: Diagnosis not present

## 2021-03-09 DIAGNOSIS — Z8616 Personal history of COVID-19: Secondary | ICD-10-CM | POA: Diagnosis not present

## 2021-03-09 DIAGNOSIS — I110 Hypertensive heart disease with heart failure: Secondary | ICD-10-CM | POA: Diagnosis not present

## 2021-03-09 NOTE — Progress Notes (Addendum)
Cardiology Office Note   Date:  03/10/2021   ID:  [name], DOB 14-Jul-1933, MRN [id number]  PCP:  Tresa Garter, MD  Cardiologist:   Rollene Rotunda, MD   Chief Complaint  Patient presents with  . Coronary Artery Disease      History of Present Illness: [name] is a 87 y.o. female of CAD with cardiac catheterization in 2020 revealing two-vessel CAD with widely patent previously placed stent to the LAD with culprit lesion felt to be an ulcerated thrombotic irregular proximal to mid RCA lesion.  She underwent a successful but complex PCI/DES of the mid RCA x1.    She was placed on dual antiplatelet therapy with aspirin and Plavix for minimum of 1 year.  She had a follow-up echocardiogram which revealed an EF of 60% to 65% with normal LV function and grade 1 diastolic dysfunction with mildly dilated atria.  She was also placed on high-dose statin, atorvastatin 40 mg at at bedtime, continued on her home dose of carvedilol 25 mg twice daily.  She was noted to have some transient confusion during her hospitalization, but had returned to normal at discharge.  She was in the hospital with aspiration pneumonia and bacteriuria.  The predominant issues was seizures.  She is at rehab.  She is getting along slowly and is doing some walking with a walker but she needs significant help.  I did look through her hospital records for this visit.  There were no cardiac complications despite being so ill.  She denies any chest pressure now.  She is not having any shortness of breath, PND or orthopnea.  Has had no palpitations, presyncope or syncope.  She is fatigued. house.   Past Medical History:  Diagnosis Date  . Anemia    iron deficiency  . Aneurysm, thoracic aortic (HCC)   . Anxiety   . B12 deficiency   . CAD (coronary artery disease)    s/p stenting of LAD 1999- cath 5-08 EF normal LAD 30-40% restenosis. D1 50% D2 80% LCX & RCA minimal plaque  . Chronic back pain   .  CKD (chronic kidney disease) stage 3, GFR 30-59 ml/min (HCC) 08/29/2020  . Constipation   . Depression   . Diabetes mellitus   . DVT (deep venous thrombosis) (HCC)   . GERD (gastroesophageal reflux disease)   . Gout   . HTN (hypertension)   . Hyperlipemia   . Obesity   . Osteoporosis   . Pancreatitis   . Polyarthritis    DJD/ possible PMR  . Renal insufficiency    Cr 1.2-1.3  . Seizures (HCC)   . Tinnitus   . Urinary frequency   . Vertigo   . Vitamin D deficiency     Past Surgical History:  Procedure Laterality Date  . ABDOMINAL HYSTERECTOMY    . CARDIAC CATHETERIZATION  2008   L main 20%, LAD stent patent, D1 50%, D2 80% (small), RCA 20%, EF 55-60%  . CHOLECYSTECTOMY    . CORONARY ANGIOPLASTY WITH STENT PLACEMENT  1999   LAD stent  . CORONARY STENT INTERVENTION N/A 10/16/2019   Procedure: CORONARY STENT INTERVENTION;  Surgeon: Marykay Lex, MD;  Location: [facility] INVASIVE CV LAB;  Service: Cardiovascular;  Laterality: N/A;  . HEMORRHOID SURGERY    . LAPAROSCOPIC LYSIS OF ADHESIONS N/A 03/24/2015   Procedure: LAPAROSCOPIC LYSIS OF ADHESIONS;  Surgeon: Luretha Murphy, MD;  Location: [facility] OR[facility];  Service: General;  Laterality: N/A;  . LAPAROSCOPY  N/A 03/24/2015   Procedure: LAPAROSCOPY DIAGNOSTIC;  Surgeon: Luretha Murphy, MD;  Location: [facility] OR[facility];  Service: General;  Laterality: N/A;  . LAPAROTOMY N/A 03/24/2015   Procedure: LAPAROTOMY with decompression of bowel;  Surgeon: Luretha Murphy, MD;  Location: [facility] ORS;  Service: General;  Laterality: N/A;  . LEFT HEART CATH AND CORONARY ANGIOGRAPHY N/A 10/16/2019   Procedure: LEFT HEART CATH AND CORONARY ANGIOGRAPHY;  Surgeon: Marykay Lex, MD;  Location: [facility] INVASIVE CV LAB;  Service: Cardiovascular;  Laterality: N/A;  . TUBAL LIGATION       Current Outpatient Medications  Medication Sig Dispense Refill  . acetaminophen (TYLENOL) 325 MG tablet Take 2 tablets (650 mg total) by mouth every 6 (six) hours as needed for mild pain, headache  or moderate pain.    [name] aspirin EC 81 MG tablet Take 1 tablet (81 mg total) by mouth daily. Swallow whole. 90 tablet 3  . B Complex-C (B-COMPLEX WITH VITAMIN C) tablet Take 1 tablet by mouth daily.    . Blood Glucose Calibration (ACCU-CHEK AVIVA) SOLN Use as directed (Patient taking differently: 1 each by Other route See admin instructions. Use as directed per physician) 3 each 1  . brimonidine (ALPHAGAN) 0.2 % ophthalmic solution Place 1 drop into both eyes 2 (two) times daily.    . carvedilol (COREG) 25 MG tablet Take 25 mg by mouth 2 (two) times daily with a meal.    . cholecalciferol (VITAMIN D) 25 MCG (1000 UNIT) tablet Take 1,000 Units by mouth daily.    . citalopram (CELEXA) 10 MG tablet Take 1 tablet (10 mg total) by mouth daily. 90 tablet 3  . diclofenac Sodium (VOLTAREN) 1 % GEL Apply 1 application topically 2 (two) times daily as needed (pain).    [name] divalproex (DEPAKOTE) 500 MG DR tablet Take 1 tablet (500 mg total) by mouth every 12 (twelve) hours.    . feeding supplement, GLUCERNA SHAKE, (GLUCERNA SHAKE) LIQD Take 237 mLs by mouth 3 (three) times daily between meals.  0  . glucose blood (ONETOUCH VERIO) test strip Use as instructed 300 each 3  . Menthol, Topical Analgesic, (BIOFREEZE EX) Apply 1 application topically 2 (two) times daily as needed (pain).    . methotrexate 2.5 MG tablet Take 10 mg by mouth once a week. For arthritis    . ondansetron (ZOFRAN) 4 MG tablet TAKE 1 TABLET BY MOUTH TWICE DAILY AS NEEDED FOR NAUSEA OR VOMITING 20 tablet 0  . ONETOUCH DELICA LANCETS FINE MISC 1 Device by Does not apply route daily as needed. (Patient taking differently: 1 Device by Does not apply route daily as needed (blood glucose).) 100 each 3  . pantoprazole (PROTONIX) 40 MG tablet Take 1 tablet (40 mg total) by mouth daily. 30 tablet 0  . rosuvastatin (CRESTOR) 10 MG tablet Take 10 mg by mouth daily.    [name] thiamine 100 MG tablet Take 1 tablet (100 mg total) by mouth daily.    . carvedilol  (COREG) 12.5 MG tablet Take 1 tablet (12.5 mg total) by mouth 2 (two) times daily with a meal. (Patient taking differently: Take 25 mg by mouth. 1 tab po bid)     No current facility-administered medications for this visit.    Allergies:   Ativan [lorazepam], Tramadol hcl, Amlodipine besylate, Atenolol, Benazepril, Benicar [olmesartan medoxomil], Cozaar, Hydralazine, Hydrochlorothiazide w-triamterene, Hydrocodone, Hydroxyzine pamoate, Iodine, Lisinopril, Peach flavor, Penicillins, Pravastatin, Prednisolone, Strawberry flavor, Tramadol hcl, and Benadryl [diphenhydramine]    ROS:  Please see the  history of present illness.   Otherwise, review of systems are positive for hemorrhoids, rectal pain, constipation.   All other systems are reviewed and negative.    PHYSICAL EXAM: VS:  BP 110/60 (BP Location: Left Arm, Patient Position: Sitting, Cuff Size: Normal)   Pulse 80   Ht 5\' 5"  (1.651 m)   BMI 24.30 kg/m  , BMI Body mass index is 24.3 kg/m. GEN:  No distress, frail-appearing NECK:  No jugular venous distention at 90 degrees, waveform within normal limits, carotid upstroke brisk and symmetric, no bruits, no thyromegaly LYMPHATICS:  No cervical adenopathy LUNGS:  Clear to auscultation bilaterally BACK:  No CVA tenderness CHEST:  Unremarkable HEART:  S1 and S2 within normal limits, no S3, no S4, no clicks, no rubs, no murmurs ABD:  Positive bowel sounds normal in frequency in pitch, no bruits, no rebound, no guarding, unable to assess midline mass or bruit with the patient seated. EXT:  2 plus pulses throughout, no edema, no cyanosis no clubbing SKIN:  No rashes no nodules NEURO:  Cranial nerves II through XII grossly intact, motor grossly intact throughout PSYCH:  Cognitively intact, oriented to person place and time   EKG:  EKG is not ordered today. The ekg ordered 01/11/2021 demonstrates sinus rhythm, rate 75, right bundle, no acute ST-T wave changes.   Recent Labs: 01/11/2021: TSH  2.020 01/12/2021: ALT 11; BUN 10; Creatinine, Ser 0.91; Hemoglobin 9.2; Platelets 151; Potassium 4.1; Sodium 139    Lipid Panel    Component Value Date/Time   CHOL 169 06/25/2015 0410   TRIG 61 06/25/2015 0410   HDL 57 06/25/2015 0410   CHOLHDL 3.0 06/25/2015 0410   VLDL 12 06/25/2015 0410   LDLCALC 100 (H) 06/25/2015 0410   LDLDIRECT 114.8 01/10/2011 0923      Wt Readings from Last 3 Encounters:  02/06/21 146 lb (66.2 kg)  01/08/21 168 lb 3.4 oz (76.3 kg)  04/19/20 200 lb (90.7 kg)    Diagnostic Dominance: Right    Intervention       Other studies Reviewed: Additional studies/ records that were reviewed today include: Labs. Review of the above records demonstrates:  Please see elsewhere in the note.     ASSESSMENT AND PLAN:  CAD:   The patient's had no anginal symptoms and no evidence of ischemia despite her recent acute hospitalization.  No change in therapy.   Hyperlipidemia:   LDL was not checked in the hospital.  I will defer doing this until she is further out from her acute illness.  DM:  A1c was A1c was actually quite low at 6.2.  Previously it had been 7.1.    Hypertension:   Her BP was controlled.  No change in therapy.   Rectal pain/hemorrhoids: I reinforced with the patient that she does have a rectal ointment that has been ordered and they need to make sure she is getting it 3 times a day for the next 4 days.  They are going to check with the rehab center.   Current medicines are reviewed at length with the patient today.  The patient does not have concerns regarding medicines.  The following changes have been made:  As aobve  Labs/ tests ordered today include: None  No orders of the defined types were placed in this encounter.    Disposition:   Follow-up with me in 1 year   Signed, 06/19/20, MD  03/10/2021 10:57 AM    [facility]

## 2021-03-10 ENCOUNTER — Ambulatory Visit: Payer: Medicare HMO | Admitting: Cardiology

## 2021-03-10 ENCOUNTER — Encounter: Payer: Self-pay | Admitting: Cardiology

## 2021-03-10 ENCOUNTER — Other Ambulatory Visit: Payer: Self-pay

## 2021-03-10 VITALS — BP 110/60 | HR 80 | Ht 65.0 in

## 2021-03-10 DIAGNOSIS — E785 Hyperlipidemia, unspecified: Secondary | ICD-10-CM | POA: Diagnosis not present

## 2021-03-10 DIAGNOSIS — D649 Anemia, unspecified: Secondary | ICD-10-CM | POA: Diagnosis not present

## 2021-03-10 DIAGNOSIS — E118 Type 2 diabetes mellitus with unspecified complications: Secondary | ICD-10-CM

## 2021-03-10 DIAGNOSIS — I2510 Atherosclerotic heart disease of native coronary artery without angina pectoris: Secondary | ICD-10-CM | POA: Diagnosis not present

## 2021-03-10 DIAGNOSIS — I10 Essential (primary) hypertension: Secondary | ICD-10-CM

## 2021-03-10 DIAGNOSIS — K649 Unspecified hemorrhoids: Secondary | ICD-10-CM | POA: Diagnosis not present

## 2021-03-10 DIAGNOSIS — D61818 Other pancytopenia: Secondary | ICD-10-CM | POA: Diagnosis not present

## 2021-03-10 NOTE — Patient Instructions (Signed)
Medication Instructions:  Your physician recommends that you continue on your current medications as directed. Please refer to the Current Medication list given to you today.  *If you need a refill on your cardiac medications before your next appointment, please call your pharmacy*  Lab Work: NONE ordered at this time of appointment   If you have labs (blood work) drawn today and your tests are completely normal, you will receive your results only by: MyChart Message (if you have MyChart) OR A paper copy in the mail If you have any lab test that is abnormal or we need to change your treatment, we will call you to review the results.  Testing/Procedures: NONE ordered at this time of appointment   Follow-Up: At [facility], you and your health needs are our priority.  As part of our continuing mission to provide you with exceptional heart care, we have created designated Provider Care Teams.  These Care Teams include your primary Cardiologist (physician) and Advanced Practice Providers (APPs -  Physician Assistants and Nurse Practitioners) who all work together to provide you with the care you need, when you need it.  Your next appointment:   12 month(s)  The format for your next appointment:   In Person  Provider:   James Hochrein, MD    Other Instructions   

## 2021-03-15 DIAGNOSIS — D696 Thrombocytopenia, unspecified: Secondary | ICD-10-CM | POA: Diagnosis not present

## 2021-03-15 DIAGNOSIS — D72819 Decreased white blood cell count, unspecified: Secondary | ICD-10-CM | POA: Diagnosis not present

## 2021-03-15 DIAGNOSIS — D649 Anemia, unspecified: Secondary | ICD-10-CM | POA: Diagnosis not present

## 2021-03-15 DIAGNOSIS — D61818 Other pancytopenia: Secondary | ICD-10-CM | POA: Diagnosis not present

## 2021-03-15 DIAGNOSIS — I2510 Atherosclerotic heart disease of native coronary artery without angina pectoris: Secondary | ICD-10-CM | POA: Diagnosis not present

## 2021-03-16 DIAGNOSIS — N183 Chronic kidney disease, stage 3 unspecified: Secondary | ICD-10-CM | POA: Diagnosis not present

## 2021-03-16 DIAGNOSIS — R2681 Unsteadiness on feet: Secondary | ICD-10-CM | POA: Diagnosis not present

## 2021-03-16 DIAGNOSIS — Z8616 Personal history of COVID-19: Secondary | ICD-10-CM | POA: Diagnosis not present

## 2021-03-16 DIAGNOSIS — F329 Major depressive disorder, single episode, unspecified: Secondary | ICD-10-CM | POA: Diagnosis not present

## 2021-03-16 DIAGNOSIS — M6281 Muscle weakness (generalized): Secondary | ICD-10-CM | POA: Diagnosis not present

## 2021-03-16 DIAGNOSIS — I10 Essential (primary) hypertension: Secondary | ICD-10-CM | POA: Diagnosis not present

## 2021-03-16 DIAGNOSIS — I2510 Atherosclerotic heart disease of native coronary artery without angina pectoris: Secondary | ICD-10-CM | POA: Diagnosis not present

## 2021-03-16 DIAGNOSIS — I5032 Chronic diastolic (congestive) heart failure: Secondary | ICD-10-CM | POA: Diagnosis not present

## 2021-03-16 DIAGNOSIS — E119 Type 2 diabetes mellitus without complications: Secondary | ICD-10-CM | POA: Diagnosis not present

## 2021-03-20 DIAGNOSIS — I10 Essential (primary) hypertension: Secondary | ICD-10-CM | POA: Diagnosis not present

## 2021-03-20 DIAGNOSIS — M6281 Muscle weakness (generalized): Secondary | ICD-10-CM | POA: Diagnosis not present

## 2021-03-21 ENCOUNTER — Emergency Department (HOSPITAL_COMMUNITY)
Admission: EM | Admit: 2021-03-21 | Discharge: 2021-03-22 | Disposition: A | Discharge status: Home / Self Care | Payer: Medicare HMO | Attending: Emergency Medicine | Admitting: Emergency Medicine

## 2021-03-21 ENCOUNTER — Emergency Department (HOSPITAL_COMMUNITY): Payer: Medicare HMO

## 2021-03-21 ENCOUNTER — Other Ambulatory Visit: Payer: Self-pay

## 2021-03-21 DIAGNOSIS — D638 Anemia in other chronic diseases classified elsewhere: Secondary | ICD-10-CM | POA: Diagnosis not present

## 2021-03-21 DIAGNOSIS — R5381 Other malaise: Secondary | ICD-10-CM | POA: Diagnosis not present

## 2021-03-21 DIAGNOSIS — I5032 Chronic diastolic (congestive) heart failure: Secondary | ICD-10-CM | POA: Diagnosis not present

## 2021-03-21 DIAGNOSIS — K5900 Constipation, unspecified: Secondary | ICD-10-CM | POA: Insufficient documentation

## 2021-03-21 DIAGNOSIS — K8050 Calculus of bile duct without cholangitis or cholecystitis without obstruction: Secondary | ICD-10-CM | POA: Diagnosis not present

## 2021-03-21 DIAGNOSIS — D61818 Other pancytopenia: Secondary | ICD-10-CM | POA: Diagnosis not present

## 2021-03-21 DIAGNOSIS — D649 Anemia, unspecified: Secondary | ICD-10-CM | POA: Diagnosis not present

## 2021-03-21 DIAGNOSIS — I2510 Atherosclerotic heart disease of native coronary artery without angina pectoris: Secondary | ICD-10-CM | POA: Diagnosis not present

## 2021-03-21 DIAGNOSIS — Z955 Presence of coronary angioplasty implant and graft: Secondary | ICD-10-CM | POA: Insufficient documentation

## 2021-03-21 DIAGNOSIS — Z79899 Other long term (current) drug therapy: Secondary | ICD-10-CM | POA: Insufficient documentation

## 2021-03-21 DIAGNOSIS — K838 Other specified diseases of biliary tract: Secondary | ICD-10-CM | POA: Diagnosis not present

## 2021-03-21 DIAGNOSIS — N183 Chronic kidney disease, stage 3 unspecified: Secondary | ICD-10-CM | POA: Insufficient documentation

## 2021-03-21 DIAGNOSIS — I5031 Acute diastolic (congestive) heart failure: Secondary | ICD-10-CM | POA: Diagnosis not present

## 2021-03-21 DIAGNOSIS — Z8616 Personal history of COVID-19: Secondary | ICD-10-CM | POA: Diagnosis not present

## 2021-03-21 DIAGNOSIS — J9811 Atelectasis: Secondary | ICD-10-CM | POA: Diagnosis not present

## 2021-03-21 DIAGNOSIS — E1122 Type 2 diabetes mellitus with diabetic chronic kidney disease: Secondary | ICD-10-CM | POA: Insufficient documentation

## 2021-03-21 DIAGNOSIS — R531 Weakness: Secondary | ICD-10-CM | POA: Diagnosis not present

## 2021-03-21 DIAGNOSIS — G459 Transient cerebral ischemic attack, unspecified: Secondary | ICD-10-CM | POA: Diagnosis not present

## 2021-03-21 DIAGNOSIS — I517 Cardiomegaly: Secondary | ICD-10-CM | POA: Diagnosis not present

## 2021-03-21 DIAGNOSIS — I10 Essential (primary) hypertension: Secondary | ICD-10-CM | POA: Diagnosis not present

## 2021-03-21 DIAGNOSIS — R5383 Other fatigue: Secondary | ICD-10-CM | POA: Diagnosis not present

## 2021-03-21 DIAGNOSIS — Z7982 Long term (current) use of aspirin: Secondary | ICD-10-CM | POA: Diagnosis not present

## 2021-03-21 DIAGNOSIS — I130 Hypertensive heart and chronic kidney disease with heart failure and stage 1 through stage 4 chronic kidney disease, or unspecified chronic kidney disease: Secondary | ICD-10-CM | POA: Diagnosis not present

## 2021-03-21 DIAGNOSIS — K6289 Other specified diseases of anus and rectum: Secondary | ICD-10-CM | POA: Diagnosis not present

## 2021-03-21 DIAGNOSIS — N83202 Unspecified ovarian cyst, left side: Secondary | ICD-10-CM

## 2021-03-21 DIAGNOSIS — K8070 Calculus of gallbladder and bile duct without cholecystitis without obstruction: Secondary | ICD-10-CM | POA: Diagnosis not present

## 2021-03-21 LAB — CBC WITH DIFFERENTIAL/PLATELET
Abs Immature Granulocytes: 0.03 10*3/uL (ref 0.00–0.07)
Basophils Absolute: 0 10*3/uL (ref 0.0–0.1)
Basophils Relative: 1 %
Eosinophils Absolute: 0.1 10*3/uL (ref 0.0–0.5)
Eosinophils Relative: 3 %
HCT: 25.3 % — ABNORMAL LOW (ref 36.0–46.0)
Hemoglobin: 7.8 g/dL — ABNORMAL LOW (ref 12.0–15.0)
Immature Granulocytes: 1 %
Lymphocytes Relative: 18 %
Lymphs Abs: 0.8 10*3/uL (ref 0.7–4.0)
MCH: 31.8 pg (ref 26.0–34.0)
MCHC: 30.8 g/dL (ref 30.0–36.0)
MCV: 103.3 fL — ABNORMAL HIGH (ref 80.0–100.0)
Monocytes Absolute: 0.3 10*3/uL (ref 0.1–1.0)
Monocytes Relative: 7 %
Neutro Abs: 3 10*3/uL (ref 1.7–7.7)
Neutrophils Relative %: 70 %
Platelets: 155 10*3/uL (ref 150–400)
RBC: 2.45 MIL/uL — ABNORMAL LOW (ref 3.87–5.11)
RDW: 15.8 % — ABNORMAL HIGH (ref 11.5–15.5)
WBC: 4.3 10*3/uL (ref 4.0–10.5)
nRBC: 0 % (ref 0.0–0.2)

## 2021-03-21 LAB — I-STAT CHEM 8, ED
BUN: 23 mg/dL (ref 8–23)
Calcium, Ion: 0.98 mmol/L — ABNORMAL LOW (ref 1.15–1.40)
Chloride: 104 mmol/L (ref 98–111)
Creatinine, Ser: 0.8 mg/dL (ref 0.44–1.00)
Glucose, Bld: 79 mg/dL (ref 70–99)
HCT: 21 % — ABNORMAL LOW (ref 36.0–46.0)
Hemoglobin: 7.1 g/dL — ABNORMAL LOW (ref 12.0–15.0)
Potassium: 4.2 mmol/L (ref 3.5–5.1)
Sodium: 136 mmol/L (ref 135–145)
TCO2: 24 mmol/L (ref 22–32)

## 2021-03-21 LAB — TROPONIN I (HIGH SENSITIVITY)
Troponin I (High Sensitivity): 10 ng/L (ref <18)
Troponin I (High Sensitivity): 12 ng/L (ref <18)

## 2021-03-21 LAB — COMPREHENSIVE METABOLIC PANEL
ALT: 8 U/L (ref 0–44)
AST: 26 U/L (ref 15–41)
Albumin: 2.4 g/dL — ABNORMAL LOW (ref 3.5–5.0)
Alkaline Phosphatase: 50 U/L (ref 38–126)
Anion gap: 10 (ref 5–15)
BUN: 22 mg/dL (ref 8–23)
CO2: 21 mmol/L — ABNORMAL LOW (ref 22–32)
Calcium: 8.5 mg/dL — ABNORMAL LOW (ref 8.9–10.3)
Chloride: 102 mmol/L (ref 98–111)
Creatinine, Ser: 0.79 mg/dL (ref 0.44–1.00)
GFR, Estimated: >60 mL/min (ref >60)
Glucose, Bld: 74 mg/dL (ref 70–99)
Potassium: 4.2 mmol/L (ref 3.5–5.1)
Sodium: 133 mmol/L — ABNORMAL LOW (ref 135–145)
Total Bilirubin: 0.7 mg/dL (ref 0.3–1.2)
Total Protein: 6.5 g/dL (ref 6.5–8.1)

## 2021-03-21 LAB — POC OCCULT BLOOD, ED: Fecal Occult Bld: NEGATIVE

## 2021-03-21 MED ORDER — FERROUS SULFATE 325 (65 FE) MG PO TABS: 325.0000 mg | ORAL_TABLET | Freq: Every day | ORAL | 0 refills | Status: AC

## 2021-03-21 MED ORDER — FLEET ENEMA 7-19 GM/118ML RE ENEM
1.0000 | ENEMA | Freq: Once | RECTAL | Status: AC
  Administered 2021-03-21: 1 via RECTAL
  Filled 2021-03-21: qty 1

## 2021-03-21 MED ORDER — FLEET ENEMA 7-19 GM/118ML RE ENEM: 1.0000 | ENEMA | Freq: Every day | RECTAL | 0 refills | Status: DC | PRN

## 2021-03-21 MED ORDER — SODIUM CHLORIDE 0.9 % IV BOLUS
500.0000 mL | Freq: Once | INTRAVENOUS | Status: AC
  Administered 2021-03-21: 500 mL via INTRAVENOUS

## 2021-03-21 NOTE — ED Triage Notes (Signed)
PT BIB [facility] from [facility] place for "possible low iron and H&H".  When asked about labs they could not produce any results to [facility].  PT states she has bad arthiritis, voids without a problem and last BM was 3 days ago with the help of a laxitive.

## 2021-03-21 NOTE — Discharge Instructions (Signed)
Take iron supplement to help with your anemia.  Repeat CBC in a week   Continue your bowel regimen and if she has no abdominal for 2 to 3 days please consider give an enema   See your doctor for follow up   Return to ER if she has severe abdominal pain, vomiting, blood in stool

## 2021-03-21 NOTE — ED Provider Notes (Signed)
[facility] EMERGENCY DEPARTMENT Provider Note   CSN: [id number] Arrival date & time: 03/21/21  1631     History No chief complaint on file.   [name] is a 87 y.o. female history of thoracic aneurysm, CAD, CKD, diabetes, here presenting with possible anemia.  Patient is from [facility].  Patient states that she is constipated for the last several days and they have been giving her stool softeners.  Patient has labs drawn today apparently has low hemoglobin but cannot tell me how low it is.  Patient is bedbound at baseline now.  She states that she has been weak.  She denies any blood in her stool or melena.  The history is provided by the patient.       Past Medical History:  Diagnosis Date  . Anemia    iron deficiency  . Aneurysm, thoracic aortic (HCC)   . Anxiety   . B12 deficiency   . CAD (coronary artery disease)    s/p stenting of LAD 1999- cath 5-08 EF normal LAD 30-40% restenosis. D1 50% D2 80% LCX & RCA minimal plaque  . Chronic back pain   . CKD (chronic kidney disease) stage 3, GFR 30-59 ml/min (HCC) 08/29/2020  . Constipation   . Depression   . Diabetes mellitus   . DVT (deep venous thrombosis) (HCC)   . GERD (gastroesophageal reflux disease)   . Gout   . HTN (hypertension)   . Hyperlipemia   . Obesity   . Osteoporosis   . Pancreatitis   . Polyarthritis    DJD/ possible PMR  . Renal insufficiency    Cr 1.2-1.3  . Seizures (HCC)   . Tinnitus   . Urinary frequency   . Vertigo   . Vitamin D deficiency     Patient Active Problem List   Diagnosis Date Noted  . Palpitations 01/08/2021  . AMS (altered mental status) 01/08/2021  . History of seizure 01/08/2021  . S/P primary angioplasty with coronary stent 01/08/2021  . Chronic diastolic CHF (congestive heart failure) (HCC) 01/08/2021  . CKD (chronic kidney disease) stage 3, GFR 30-59 ml/min (HCC) 08/29/2020  . Depression 08/29/2020  . COVID-19 virus infection 02/05/2020  .  Unstable angina (HCC) 10/16/2019  . Non-ST elevation (NSTEMI) myocardial infarction (HCC) 10/16/2019  . Nausea 06/23/2018  . CHF (congestive heart failure) (HCC) 04/03/2018  . AKI (acute kidney injury) (HCC) 11/20/2017  . Dehydration   . Generalized weakness 11/18/2017  . Second degree burn of foot 11/18/2017  . Seizures (HCC) 07/17/2016  . Hypertensive urgency 07/17/2016  . Acute diastolic CHF (congestive heart failure) (HCC) 07/17/2016  . UTI (urinary tract infection) 02/28/2016  . Urinary incontinence 10/28/2015  . Alopecia 08/12/2015  . TIA (transient ischemic attack) 07/01/2015  . Acute encephalopathy 06/24/2015  . Protein-calorie malnutrition, severe (HCC) 04/19/2015  . Fever   . Abdominal abscess   . Pressure ulcer 04/07/2015  . Acute kidney injury (HCC)   . Sepsis (HCC)   . Stroke (HCC)   . Facial droop   . Confusion 03/26/2015  . Malnutrition of moderate degree (HCC) 03/20/2015  . Small bowel obstruction s/p exlap/LOA/decompression 03/25/2015 03/18/2015  . Essential hypertension 03/18/2015  . Noncompliance with medication regimen 12/07/2014  . Fall against object 06/24/2014  . Contusion of left hand 06/24/2014  . Contusion of left hip 06/24/2014  . Loss of weight 04/12/2014  . Malignant hypertension with renal failure and congestive heart failure (HCC) 09/23/2013  . Knee pain, bilateral  07/27/2013  . Chronic fatigue disorder 01/19/2013  . Vaginitis due to Candida 09/02/2012  . Sinusitis 09/02/2012  . Anemia in other chronic diseases classified elsewhere 09/02/2012  . Polymyalgia rheumatica (HCC) 09/10/2011  . Vertigo 08/21/2011  . Fatigue 05/11/2011  . ANEMIA OF CHRONIC DISEASE 09/06/2010  . KNEE PAIN, CHRONIC 05/26/2010  . DIZZINESS 05/11/2010  . SHOULDER PAIN 04/25/2010  . FREQUENCY, URINARY 03/16/2010  . CONSTIPATION, CHRONIC 01/17/2010  . FOOT PAIN 01/17/2009  . Rash and other nonspecific skin eruption 01/17/2009  . TINNITUS NOS 10/18/2008  . B12  deficiency 08/10/2008  . LOW BACK PAIN 06/23/2008  . CHEST WALL PAIN 06/23/2008  . Dysuria 03/18/2008  . Abdominal pain 03/18/2008  . Hyperlipidemia 12/24/2007  . DYSPNEA 12/24/2007  . Anxiety disorder 12/07/2007  . GERD 12/07/2007  . CHEST PAIN 12/07/2007  . OTHER SPEC FORMS CHRONIC ISCHEMIC HEART DISEASE 12/01/2007  . Type 2 diabetes mellitus with diabetic nephropathy, without long-term current use of insulin (HCC) 11/28/2007  . Pain in joint 11/28/2007  . Gout 09/17/2007  . Adjustment disorder with mixed anxiety and depressed mood 09/17/2007  . Coronary atherosclerosis 09/17/2007  . Disorder resulting from impaired renal function 09/17/2007  . OSTEOPOROSIS 09/17/2007  . DVT, HX OF 09/17/2007  . PANCREATITIS, HX OF 09/17/2007    Past Surgical History:  Procedure Laterality Date  . ABDOMINAL HYSTERECTOMY    . CARDIAC CATHETERIZATION  2008   L main 20%, LAD stent patent, D1 50%, D2 80% (small), RCA 20%, EF 55-60%  . CHOLECYSTECTOMY    . CORONARY ANGIOPLASTY WITH STENT PLACEMENT  1999   LAD stent  . CORONARY STENT INTERVENTION N/A 10/16/2019   Procedure: CORONARY STENT INTERVENTION;  Surgeon: Marykay Lex, MD;  Location: [facility] INVASIVE CV LAB;  Service: Cardiovascular;  Laterality: N/A;  . HEMORRHOID SURGERY    . LAPAROSCOPIC LYSIS OF ADHESIONS N/A 03/24/2015   Procedure: LAPAROSCOPIC LYSIS OF ADHESIONS;  Surgeon: Luretha Murphy, MD;  Location: [facility] ORS;  Service: General;  Laterality: N/A;  . LAPAROSCOPY N/A 03/24/2015   Procedure: LAPAROSCOPY DIAGNOSTIC;  Surgeon: Luretha Murphy, MD;  Location: [facility] ORS;  Service: General;  Laterality: N/A;  . LAPAROTOMY N/A 03/24/2015   Procedure: LAPAROTOMY with decompression of bowel;  Surgeon: Luretha Murphy, MD;  Location: [facility] ORS;  Service: General;  Laterality: N/A;  . LEFT HEART CATH AND CORONARY ANGIOGRAPHY N/A 10/16/2019   Procedure: LEFT HEART CATH AND CORONARY ANGIOGRAPHY;  Surgeon: Marykay Lex, MD;  Location[facility] INVASIVE CV LAB;   Service: Cardiovascular;  Laterality: N/A;  . TUBAL LIGATION       OB History   No obstetric history on file.     Family History  Adopted: Yes  Problem Relation Age of Onset  . Diabetes Mother   . Hypertension Father     Social History   Tobacco Use  . Smoking status: Never Smoker  . Smokeless tobacco: Never Used  Substance Use Topics  . Alcohol use: No  . Drug use: No    Home Medications Prior to Admission medications   Medication Sig Start Date End Date Taking? Authorizing Provider  acetaminophen (TYLENOL) 325 MG tablet Take 2 tablets (650 mg total) by mouth every 6 (six) hours as needed for mild pain, headache or moderate pain. 01/14/21   Ghimire, Werner Lean, MD  aspirin EC 81 MG tablet Take 1 tablet (81 mg total) by mouth daily. Swallow whole. 10/27/20   Rollene Rotunda, MD  B Complex-C (B-COMPLEX WITH VITAMIN C) tablet Take  1 tablet by mouth daily.    [provider]  Blood Glucose Calibration (ACCU-CHEK AVIVA) SOLN Use as directed Patient taking differently: 1 each by Other route See admin instructions. Use as directed per physician 02/20/17   Plotnikov, [name], MD  brimonidine (ALPHAGAN) 0.2 % ophthalmic solution Place 1 drop into both eyes 2 (two) times daily. 01/14/20   [provider]  carvedilol (COREG) 12.5 MG tablet Take 1 tablet (12.5 mg total) by mouth 2 (two) times daily with a meal. Patient taking differently: Take 25 mg by mouth. 1 tab po bid 01/16/21   Ghimire, Werner Lean, MD  carvedilol (COREG) 25 MG tablet Take 25 mg by mouth 2 (two) times daily with a meal.    [provider]  cholecalciferol (VITAMIN D) 25 MCG (1000 UNIT) tablet Take 1,000 Units by mouth daily.    [provider]  citalopram (CELEXA) 10 MG tablet Take 1 tablet (10 mg total) by mouth daily. 08/29/20 08/29/21  Corwin Levins, MD  diclofenac Sodium (VOLTAREN) 1 % GEL Apply 1 application topically 2 (two) times daily as needed (pain).    [provider]   divalproex (DEPAKOTE) 500 MG DR tablet Take 1 tablet (500 mg total) by mouth every 12 (twelve) hours. 01/14/21   Ghimire, Werner Lean, MD  feeding supplement, GLUCERNA SHAKE, (GLUCERNA SHAKE) LIQD Take 237 mLs by mouth 3 (three) times daily between meals. 02/09/20   Leroy Sea, MD  glucose blood (ONETOUCH VERIO) test strip Use as instructed 08/27/17   Plotnikov, [name], MD  Menthol, Topical Analgesic, (BIOFREEZE EX) Apply 1 application topically 2 (two) times daily as needed (pain).    [provider]  methotrexate 2.5 MG tablet Take 10 mg by mouth once a week. For arthritis 10/20/20   [provider]  ondansetron (ZOFRAN) 4 MG tablet TAKE 1 TABLET BY MOUTH TWICE DAILY AS NEEDED FOR NAUSEA OR VOMITING 02/01/21   Plotnikov, [name], MD  ONETOUCH DELICA LANCETS FINE MISC 1 Device by Does not apply route daily as needed. Patient taking differently: 1 Device by Does not apply route daily as needed (blood glucose). 09/21/16   Plotnikov, [name], MD  pantoprazole (PROTONIX) 40 MG tablet Take 1 tablet (40 mg total) by mouth daily. 02/09/20   Leroy Sea, MD  rosuvastatin (CRESTOR) 10 MG tablet Take 10 mg by mouth daily.    [provider]  thiamine 100 MG tablet Take 1 tablet (100 mg total) by mouth daily. 01/14/21   Ghimire, Werner Lean, MD    Allergies    Ativan [lorazepam], Tramadol hcl, Amlodipine besylate, Atenolol, Benazepril, Benicar [olmesartan medoxomil], Cozaar, Hydralazine, Hydrochlorothiazide w-triamterene, Hydrocodone, Hydroxyzine pamoate, Iodine, Lisinopril, Peach flavor, Penicillins, Pravastatin, Prednisolone, Strawberry flavor, Tramadol hcl, and Benadryl [diphenhydramine]  Review of Systems   Review of Systems  Constitutional: Positive for fatigue.  Gastrointestinal: Positive for constipation.  All other systems reviewed and are negative.   Physical Exam Updated Vital Signs BP (!) 125/56   Pulse 73   Temp 99 F (37.2 C) (Oral)   Resp 16    SpO2 99%   Physical Exam Vitals and nursing note reviewed.  Constitutional:      Comments: Chronically ill  HENT:     Head: Normocephalic.     Nose: Nose normal.     Mouth/Throat:     Mouth: Mucous membranes are dry.  Eyes:     Extraocular Movements: Extraocular movements intact.     Comments: Conjunctiva slightly  pale  Cardiovascular:     Rate and Rhythm: Normal rate and regular rhythm.     Pulses: Normal pulses.     Heart sounds: Normal heart sounds.  Pulmonary:     Effort: Pulmonary effort is normal.     Breath sounds: Normal breath sounds.  Abdominal:     General: Abdomen is flat.     Comments: Slightly distended and mild diffuse tenderness  Genitourinary:    Comments: Rectal with some soft brown stool Musculoskeletal:        General: Normal range of motion.     Cervical back: Normal range of motion and neck supple.  Skin:    General: Skin is warm.     Capillary Refill: Capillary refill takes less than 2 seconds.  Neurological:     Comments: Moving all extremities.  Patient is bedbound at baseline  Psychiatric:        Mood and Affect: Mood normal.        Behavior: Behavior normal.     ED Results / Procedures / Treatments   Labs (all labs ordered are listed, but only abnormal results are displayed) Labs Reviewed  CBC WITH DIFFERENTIAL/PLATELET - Abnormal; Notable for the following components:      Result Value   RBC 2.45 (*)    Hemoglobin 7.8 (*)    HCT 25.3 (*)    MCV 103.3 (*)    RDW 15.8 (*)    All other components within normal limits  COMPREHENSIVE METABOLIC PANEL - Abnormal; Notable for the following components:   Sodium 133 (*)    CO2 21 (*)    Calcium 8.5 (*)    Albumin 2.4 (*)    All other components within normal limits  I-STAT CHEM 8, ED - Abnormal; Notable for the following components:   Calcium, Ion 0.98 (*)    Hemoglobin 7.1 (*)    HCT 21.0 (*)    All other components within normal limits  POC OCCULT BLOOD, ED  TYPE AND SCREEN   TROPONIN I (HIGH SENSITIVITY)  TROPONIN I (HIGH SENSITIVITY)    EKG EKG Interpretation  Date/Time:  Tuesday Mar 21 2021 17:54:25 EDT Ventricular Rate:  71 PR Interval:  183 QRS Duration: 129 QT Interval:  472 QTC Calculation: 513 R Axis:   36 Text Interpretation: Sinus rhythm Right bundle branch block No significant change since last tracing Confirmed by Richardean Canal [phone]) on 03/21/2021 6:25:55 PM   Radiology CT ABDOMEN PELVIS WO CONTRAST  Result Date: 03/21/2021 CLINICAL DATA:  Bowel obstruction suspected Patient brought to the emergency room due to possible low iron, hemoglobin and hematocrit. Last bowel movement 3 days ago. EXAM: CT ABDOMEN AND PELVIS WITHOUT CONTRAST TECHNIQUE: Multidetector CT imaging of the abdomen and pelvis was performed following the standard protocol without IV contrast. COMPARISON:  Most recent abdominal CT 01/08/2021 FINDINGS: Lower chest: Motion artifact through the lung bases. No obvious focal airspace disease. Dense mitral annulus calcifications. There are coronary artery calcifications. Hepatobiliary: Motion artifact through the liver which limits assessment. There is central intrahepatic biliary ductal dilatation. Post cholecystectomy. Dilated common bile duct at 18 mm. A least 3 intraluminal stones within the common bile duct, largest measuring 13 and 14 mm, series 6, image 39. No definite stone at the ampulla. Stones are previously seen more proximally in the common bile duct. Pancreas: No ductal dilatation or inflammation. Spleen: Normal in size without focal abnormality. Adrenals/Urinary Tract: No adrenal nodule. No hydronephrosis or perinephric edema. No renal calculi or  evidence of focal renal lesion. Unremarkable urinary bladder. Stomach/Bowel: Detailed bowel assessment limited in the absence of enteric contrast. Small hiatal hernia. Small duodenal diverticulum. No small bowel obstruction, dilatation, or evidence of wall thickening. Circumferential wall  thickening of the rectum with stool distending the rectum and perirectal soft tissue edema. Small volume of stool in the more proximal colon. Sigmoid colon is redundant and courses into the right abdomen. Moderate diverticulosis in the distal descending and sigmoid colon without diverticulitis. Mild wall thickening of the ascending colon versus nondistention, series 3, image 33. Vascular/Lymphatic: Aortic and branch atherosclerosis. Aortic tortuosity without aneurysm. No bulky abdominopelvic adenopathy. Reproductive: Hysterectomy. 18 mm cyst in the left adnexa, stable from prior exam. Right ovary is quiescent. Other: Presacral soft tissue edema. No free air or free fluid. Postsurgical change of the anterior abdominal wall without abdominal wall hernia. Musculoskeletal: Scoliosis and multilevel degenerative change in the spine. Degenerative change of the pubic symphysis. There are no acute or suspicious osseous abnormalities. IMPRESSION: 1. Stool distending the rectum with circumferential wall thickening and perirectal soft tissue edema, fecal impaction versus proctitis. Small volume of stool in the more proximal colon. 2. Mild wall thickening of the ascending colon versus nondistention. Colonic diverticulosis without diverticulitis. 3. Post cholecystectomy. At least 3 intraluminal stones within the common bile duct, largest measuring 13 and 14 mm. There is intra and extrahepatic biliary ductal dilatation. While MRCP is the study of choice for further evaluation, this is not recommended in this elderly patient was unable to remain motionless for CT. 4. Stable 18 mm cyst in the left adnexa. No follow-up imaging recommended. Note: This recommendation does not apply to premenarchal patients and to those with increased risk (genetic, family history, elevated tumor markers or other high-risk factors) of ovarian cancer. Reference: JACR 2020 Feb; 17(2):248-254 Aortic Atherosclerosis (ICD10-I70.0). Electronically Signed    By: Narda Rutherford M.D.   On: 03/21/2021 20:30   DG Chest Port 1 View  Result Date: 03/21/2021 CLINICAL DATA:  Weakness and fatigue. EXAM: PORTABLE CHEST 1 VIEW COMPARISON:  Radiograph 01/08/2021 FINDINGS: Mild cardiomegaly. Aortic atherosclerosis and tortuosity. Low lung volumes with subsegmental atelectasis at the right lung base. Mild linear scarring at the left lung base. No confluent consolidation, pneumothorax, or significant pleural effusion. Scoliotic curvature of the spine. IMPRESSION: 1. Low lung volumes with subsegmental right basilar atelectasis. 2. Mild cardiomegaly.  Aortic Atherosclerosis (ICD10-I70.0). Electronically Signed   By: Narda Rutherford M.D.   On: 03/21/2021 17:32    Procedures Procedures   Medications Ordered in ED Medications  sodium chloride 0.9 % bolus 500 mL (has no administration in time range)  sodium phosphate (FLEET) 7-19 GM/118ML enema 1 enema (has no administration in time range)    ED Course  I have reviewed the triage vital signs and the nursing notes.  Pertinent labs & imaging results that were available during my care of the patient were reviewed by me and considered in my medical decision making (see chart for details).    MDM Rules/Calculators/A&P                         [name] is a 87 y.o. female who presented with weakness and possible anemia.  Patient has brown stool.  No active bleeding right now.  I wonder if she has anemia of chronic disease.  We will recheck hemoglobin.  She has a history of SBO and has been constipated so we will get a CT to  rule out SBO.  We will also hydrate patient.  10:03 PM Patient CBC showed hemoglobin of 7.8.  Baseline is somewhere 8-9. She has history of iron deficiency anemia so we will start her on iron pills.  Her guaiac is negative and she has no active bleeding.  Her CT showed some fecal impaction.  Patient has some incidental findings on her CT as well.  Patient was given enema with good result.   At this point patient is stable for discharge back to facility.   Final Clinical Impression(s) / ED Diagnoses Final diagnoses:  None    Rx / DC Orders ED Discharge Orders    None       Charlynne Pander, MD 03/21/21 2204

## 2021-03-21 NOTE — ED Notes (Signed)
PTAR called  

## 2021-03-22 NOTE — ED Notes (Signed)
Left in stretcher w/ [facility]. NAD noted. PIV removed. Report already called by previous RN. Paperwork and pt belongings given to PTAR.

## 2021-03-23 DIAGNOSIS — K5909 Other constipation: Secondary | ICD-10-CM | POA: Diagnosis not present

## 2021-03-23 DIAGNOSIS — D638 Anemia in other chronic diseases classified elsewhere: Secondary | ICD-10-CM | POA: Diagnosis not present

## 2021-03-23 LAB — BPAM RBC
Blood Product Expiration Date: 202206022359
Blood Product Expiration Date: 202206022359
Unit Type and Rh: 5100
Unit Type and Rh: 5100

## 2021-03-23 LAB — TYPE AND SCREEN
ABO/RH(D): B POS
Antibody Screen: POSITIVE
Donor AG Type: NEGATIVE
Donor AG Type: NEGATIVE
Unit division: 0
Unit division: 0

## 2021-03-27 DIAGNOSIS — M6281 Muscle weakness (generalized): Secondary | ICD-10-CM | POA: Diagnosis not present

## 2021-03-27 DIAGNOSIS — I10 Essential (primary) hypertension: Secondary | ICD-10-CM | POA: Diagnosis not present

## 2021-03-27 DIAGNOSIS — R2681 Unsteadiness on feet: Secondary | ICD-10-CM | POA: Diagnosis not present

## 2021-03-27 DIAGNOSIS — F329 Major depressive disorder, single episode, unspecified: Secondary | ICD-10-CM | POA: Diagnosis not present

## 2021-03-27 DIAGNOSIS — N183 Chronic kidney disease, stage 3 unspecified: Secondary | ICD-10-CM | POA: Diagnosis not present

## 2021-03-27 DIAGNOSIS — I5032 Chronic diastolic (congestive) heart failure: Secondary | ICD-10-CM | POA: Diagnosis not present

## 2021-03-27 DIAGNOSIS — I2510 Atherosclerotic heart disease of native coronary artery without angina pectoris: Secondary | ICD-10-CM | POA: Diagnosis not present

## 2021-03-27 DIAGNOSIS — Z8616 Personal history of COVID-19: Secondary | ICD-10-CM | POA: Diagnosis not present

## 2021-03-27 DIAGNOSIS — E119 Type 2 diabetes mellitus without complications: Secondary | ICD-10-CM | POA: Diagnosis not present

## 2021-03-29 ENCOUNTER — Telehealth: Payer: Self-pay | Admitting: Physician Assistant

## 2021-03-29 DIAGNOSIS — D638 Anemia in other chronic diseases classified elsewhere: Secondary | ICD-10-CM | POA: Diagnosis not present

## 2021-03-29 DIAGNOSIS — D696 Thrombocytopenia, unspecified: Secondary | ICD-10-CM | POA: Diagnosis not present

## 2021-03-29 DIAGNOSIS — G4089 Other seizures: Secondary | ICD-10-CM | POA: Diagnosis not present

## 2021-03-29 DIAGNOSIS — D72819 Decreased white blood cell count, unspecified: Secondary | ICD-10-CM | POA: Diagnosis not present

## 2021-03-29 DIAGNOSIS — E119 Type 2 diabetes mellitus without complications: Secondary | ICD-10-CM | POA: Diagnosis not present

## 2021-03-29 NOTE — Telephone Encounter (Signed)
Received a new hem referral from [facility] for progressive anemia. [name] has been scheduled to see Karena Addison on 5/25 at 2pm. Appt date and time has been given to the transportation coordinator at [facility].

## 2021-03-30 ENCOUNTER — Telehealth: Payer: Self-pay | Admitting: Physician Assistant

## 2021-03-30 DIAGNOSIS — R195 Other fecal abnormalities: Secondary | ICD-10-CM | POA: Diagnosis not present

## 2021-03-30 DIAGNOSIS — M1990 Unspecified osteoarthritis, unspecified site: Secondary | ICD-10-CM | POA: Diagnosis not present

## 2021-03-30 DIAGNOSIS — D649 Anemia, unspecified: Secondary | ICD-10-CM | POA: Diagnosis not present

## 2021-03-30 NOTE — Telephone Encounter (Signed)
I received a call from [place] at [facility] to r/s ms. Buresh's appt to 5/20 at 9am.

## 2021-03-31 ENCOUNTER — Encounter: Payer: Self-pay | Admitting: Physician Assistant

## 2021-03-31 ENCOUNTER — Other Ambulatory Visit: Payer: Self-pay

## 2021-03-31 ENCOUNTER — Inpatient Hospital Stay: Payer: Medicare HMO

## 2021-03-31 ENCOUNTER — Inpatient Hospital Stay: Payer: Medicare HMO | Attending: Physician Assistant | Admitting: Physician Assistant

## 2021-03-31 VITALS — BP 175/87 | HR 79 | Temp 96.2°F | Resp 17 | Ht 65.0 in | Wt 143.7 lb

## 2021-03-31 DIAGNOSIS — M069 Rheumatoid arthritis, unspecified: Secondary | ICD-10-CM | POA: Insufficient documentation

## 2021-03-31 DIAGNOSIS — N183 Chronic kidney disease, stage 3 unspecified: Secondary | ICD-10-CM | POA: Insufficient documentation

## 2021-03-31 DIAGNOSIS — D539 Nutritional anemia, unspecified: Secondary | ICD-10-CM | POA: Diagnosis not present

## 2021-03-31 DIAGNOSIS — E1122 Type 2 diabetes mellitus with diabetic chronic kidney disease: Secondary | ICD-10-CM | POA: Insufficient documentation

## 2021-03-31 DIAGNOSIS — D649 Anemia, unspecified: Secondary | ICD-10-CM

## 2021-03-31 DIAGNOSIS — Z79899 Other long term (current) drug therapy: Secondary | ICD-10-CM | POA: Diagnosis not present

## 2021-03-31 LAB — CBC WITH DIFFERENTIAL (CANCER CENTER ONLY)
Abs Immature Granulocytes: 0.02 10*3/uL (ref 0.00–0.07)
Basophils Absolute: 0 10*3/uL (ref 0.0–0.1)
Basophils Relative: 1 %
Eosinophils Absolute: 0.2 10*3/uL (ref 0.0–0.5)
Eosinophils Relative: 5 %
HCT: 26.3 % — ABNORMAL LOW (ref 36.0–46.0)
Hemoglobin: 8.2 g/dL — ABNORMAL LOW (ref 12.0–15.0)
Immature Granulocytes: 1 %
Lymphocytes Relative: 24 %
Lymphs Abs: 0.8 10*3/uL (ref 0.7–4.0)
MCH: 31.4 pg (ref 26.0–34.0)
MCHC: 31.2 g/dL (ref 30.0–36.0)
MCV: 100.8 fL — ABNORMAL HIGH (ref 80.0–100.0)
Monocytes Absolute: 0.3 10*3/uL (ref 0.1–1.0)
Monocytes Relative: 9 %
Neutro Abs: 2 10*3/uL (ref 1.7–7.7)
Neutrophils Relative %: 60 %
Platelet Count: 193 10*3/uL (ref 150–400)
RBC: 2.61 MIL/uL — ABNORMAL LOW (ref 3.87–5.11)
RDW: 17.1 % — ABNORMAL HIGH (ref 11.5–15.5)
WBC Count: 3.3 10*3/uL — ABNORMAL LOW (ref 4.0–10.5)
nRBC: 1.2 % — ABNORMAL HIGH (ref 0.0–0.2)

## 2021-03-31 LAB — SAMPLE TO BLOOD BANK

## 2021-03-31 LAB — RETIC PANEL
Immature Retic Fract: 18.7 % — ABNORMAL HIGH (ref 2.3–15.9)
RBC.: 2.5 MIL/uL — ABNORMAL LOW (ref 3.87–5.11)
Retic Count, Absolute: 41.8 10*3/uL (ref 19.0–186.0)
Retic Ct Pct: 1.7 % (ref 0.4–3.1)
Reticulocyte Hemoglobin: 37.7 pg (ref >27.9)

## 2021-03-31 LAB — CMP (CANCER CENTER ONLY)
ALT: 8 U/L (ref 0–44)
AST: 29 U/L (ref 15–41)
Albumin: 2.8 g/dL — ABNORMAL LOW (ref 3.5–5.0)
Alkaline Phosphatase: 67 U/L (ref 38–126)
Anion gap: 9 (ref 5–15)
BUN: 12 mg/dL (ref 8–23)
CO2: 24 mmol/L (ref 22–32)
Calcium: 9.2 mg/dL (ref 8.9–10.3)
Chloride: 107 mmol/L (ref 98–111)
Creatinine: 0.76 mg/dL (ref 0.44–1.00)
GFR, Estimated: >60 mL/min (ref >60)
Glucose, Bld: 80 mg/dL (ref 70–99)
Potassium: 4 mmol/L (ref 3.5–5.1)
Sodium: 140 mmol/L (ref 135–145)
Total Bilirubin: 0.3 mg/dL (ref 0.3–1.2)
Total Protein: 7.4 g/dL (ref 6.5–8.1)

## 2021-03-31 LAB — IRON AND TIBC
Iron: 53 ug/dL (ref 41–142)
Saturation Ratios: 26 % (ref 21–57)
TIBC: 208 ug/dL — ABNORMAL LOW (ref 236–444)
UIBC: 154 ug/dL (ref 120–384)

## 2021-03-31 LAB — SEDIMENTATION RATE: Sed Rate: 125 mm/hr — ABNORMAL HIGH (ref 0–22)

## 2021-03-31 LAB — FERRITIN: Ferritin: 440 ng/mL — ABNORMAL HIGH (ref 11–307)

## 2021-03-31 LAB — FOLATE: Folate: 7.9 ng/mL (ref >5.9)

## 2021-03-31 LAB — C-REACTIVE PROTEIN: CRP: 2.3 mg/dL — ABNORMAL HIGH (ref <1.0)

## 2021-03-31 LAB — VITAMIN B12: Vitamin B-12: 969 pg/mL — ABNORMAL HIGH (ref 180–914)

## 2021-03-31 NOTE — Progress Notes (Signed)
[place] [phone]   Fax:([phone] [place] NOTE  Patient Care Team: Plotnikov, Evie Lacks, MD as PCP - Cheral Bay, MD as PCP - Cardiology (Cardiology) Hurley Cisco, MD as Attending Physician (Internal Medicine)  Hematological/Oncological History 1) Labs from Dr. Jules Husbands from [facility] and [place] -02/10/2021: WBC: 4.8, Hgb 7.4 (L), MCV 95.0, Plt 151, Sed Rate 127 (H),  -03/10/2021: WBC 2.5 (L), Hgb 7.0 (L), MCV 94.6, Plt 65 (L) -03/20/2021: WBC 3.6 (L), Hgb 6.8 (L), MCV 95.2 (L), Plt 116 (L)  2) 03/21/2021: Presented to ED due to anemia. -WBC 4.3, Hgb 7.8, MCV 103.3 (H), Plt 155 -Started ferrous sulfate 325 mg once daily.   3) 03/31/2021: Establish care with Dede Query PA-C  CHIEF COMPLAINTS/PURPOSE OF CONSULTATION:  "Anemia "  HISTORY OF PRESENTING ILLNESS:  [name] 87 y.o. female with medical history significant for CKD Stage 3, CAD, T2DM, thoracic aneurysm, seizure disorder, rheumatoid arthritis, hyperlipidemia, hx of TIA, osteoporosis and chronic diastolic heart failure. Patient is accompanied by her daughter for this visit.   On exam today, [name] reports chronic fatigue that does worsens her weakness. She is current residing in a rehab facility after hospitalization in March 2022 due to breakthrough seizure with resultant postictal state/metabolic encephalopathy. Patient requires assistance to complete her daily activities including bathing and getting dressed. She requires a wheelchair to ambulate. She denies any dietary restrictions except eating pureed foods due to poor dentition. Patient denies nausea, vomiting or abdominal pan. Patient reports constipation with a bowel movement every other day. She is currently on stool softeners and uses suppository or fleet enemas when there is refractory constipation. Patient denies any easy bruising or signs of bleeding although daughter  reports that rehab nurse saw blood after administering an enema recently. Patient denies any fevers, chills, night sweats, shortness of breath, chest pain or cough. She has no other complaints. Rest of the 10 point ROS is below.   MEDICAL HISTORY:  Past Medical History:  Diagnosis Date  . Anemia    iron deficiency  . Aneurysm, thoracic aortic ([place])   . Anxiety   . B12 deficiency   . CAD (coronary artery disease)    s/p stenting of LAD 1999- cath 5-08 EF normal LAD 30-40% restenosis. D1 50% D2 80% LCX & RCA minimal plaque  . Chronic back pain   . Constipation   . Depression   . GERD (gastroesophageal reflux disease)   . Gout   . HTN (hypertension)   . Hyperlipemia   . Obesity   . Osteoporosis   . Pancreatitis   . Polyarthritis    DJD/ possible PMR  . Renal insufficiency    Cr 1.2-1.3  . Seizures ([place])   . Tinnitus   . Urinary frequency   . Vertigo   . Vitamin D deficiency     SURGICAL HISTORY: Past Surgical History:  Procedure Laterality Date  . ABDOMINAL HYSTERECTOMY    . CARDIAC CATHETERIZATION  2008   L main 20%, LAD stent patent, D1 50%, D2 80% (small), RCA 20%, EF 55-60%  . CHOLECYSTECTOMY    . CORONARY ANGIOPLASTY WITH STENT PLACEMENT  1999   LAD stent  . CORONARY STENT INTERVENTION N/A 10/16/2019   Procedure: CORONARY STENT INTERVENTION;  Surgeon: Leonie Man, MD;  Location: [facility];  Service: Cardiovascular;  Laterality: N/A;  . HEMORRHOID SURGERY    . LAPAROSCOPIC LYSIS OF ADHESIONS N/A 03/24/2015  Procedure: LAPAROSCOPIC LYSIS OF ADHESIONS;  Surgeon: Johnathan Hausen, MD;  Location: [facility];  Service: General;  Laterality: N/A;  . LAPAROSCOPY N/A 03/24/2015   Procedure: LAPAROSCOPY DIAGNOSTIC;  Surgeon: Johnathan Hausen, MD;  Location: [facility];  Service: General;  Laterality: N/A;  . LAPAROTOMY N/A 03/24/2015   Procedure: LAPAROTOMY with decompression of bowel;  Surgeon: Johnathan Hausen, MD;  Location: [facility];  Service: General;  Laterality: N/A;  .  LEFT HEART CATH AND CORONARY ANGIOGRAPHY N/A 10/16/2019   Procedure: LEFT HEART CATH AND CORONARY ANGIOGRAPHY;  Surgeon: Leonie Man, MD;  Location: [facility];  Service: Cardiovascular;  Laterality: N/A;  . TUBAL LIGATION      SOCIAL HISTORY: Social History   Socioeconomic History  . Marital status: Married    Spouse name: Not on file  . Number of children: Not on file  . Years of education: Not on file  . Highest education level: Not on file  Occupational History  . Occupation: Retired  Tobacco Use  . Smoking status: Never Smoker  . Smokeless tobacco: Never Used  Substance and Sexual Activity  . Alcohol use: No  . Drug use: No  . Sexual activity: Not Currently  Other Topics Concern  . Not on file  Social History Narrative   She lives in [place] with her husband   She is retired   No tobacco or alcohol use.   Right handed   Social Determinants of Health   Financial Resource Strain: Not on file  Food Insecurity: Not on file  Transportation Needs: Not on file  Physical Activity: Not on file  Stress: Not on file  Social Connections: Not on file  Intimate Partner Violence: Not on file    FAMILY HISTORY: Family History  Adopted: Yes  Problem Relation Age of Onset  . Diabetes Mother   . Hypertension Father   . Cancer Sister     ALLERGIES:  is allergic to ativan [lorazepam], tramadol hcl, amlodipine besylate, atenolol, benazepril, benicar [olmesartan medoxomil], cozaar, hydralazine, hydrochlorothiazide w-triamterene, hydrocodone, hydroxyzine pamoate, iodine, lisinopril, peach flavor, penicillins, pravastatin, prednisolone, strawberry flavor, tramadol hcl, and benadryl [diphenhydramine].  MEDICATIONS:  Current Outpatient Medications  Medication Sig Dispense Refill  . acetaminophen (TYLENOL) 325 MG tablet Take 2 tablets (650 mg total) by mouth every 6 (six) hours as needed for mild pain, headache or moderate pain.    [name] aspirin EC 81 MG tablet Take 1  tablet (81 mg total) by mouth daily. Swallow whole. 90 tablet 3  . B Complex-C (B-COMPLEX WITH VITAMIN C) tablet Take 1 tablet by mouth daily.    . Blood Glucose Calibration (ACCU-CHEK AVIVA) SOLN Use as directed (Patient taking differently: 1 each by Other route See admin instructions. Use as directed per physician) 3 each 1  . brimonidine (ALPHAGAN) 0.2 % ophthalmic solution Place 1 drop into both eyes 2 (two) times daily.    . carvedilol (COREG) 12.5 MG tablet Take 1 tablet (12.5 mg total) by mouth 2 (two) times daily with a meal. (Patient taking differently: Take 25 mg by mouth. 1 tab po bid)    . carvedilol (COREG) 25 MG tablet Take 25 mg by mouth 2 (two) times daily with a meal.    . cholecalciferol (VITAMIN D) 25 MCG (1000 UNIT) tablet Take 1,000 Units by mouth daily.    . citalopram (CELEXA) 10 MG tablet Take 1 tablet (10 mg total) by mouth daily. 90 tablet 3  . diclofenac Sodium (VOLTAREN) 1 % GEL Apply  1 application topically 2 (two) times daily as needed (pain).    [name] divalproex (DEPAKOTE) 500 MG DR tablet Take 1 tablet (500 mg total) by mouth every 12 (twelve) hours.    . feeding supplement, GLUCERNA SHAKE, (GLUCERNA SHAKE) LIQD Take 237 mLs by mouth 3 (three) times daily between meals.  0  . ferrous sulfate 325 (65 FE) MG tablet Take 1 tablet (325 mg total) by mouth daily. 90 tablet 0  . glucose blood (ONETOUCH VERIO) test strip Use as instructed 300 each 3  . Menthol, Topical Analgesic, (BIOFREEZE EX) Apply 1 application topically 2 (two) times daily as needed (pain).    . methotrexate 2.5 MG tablet Take 10 mg by mouth once a week. For arthritis    . ondansetron (ZOFRAN) 4 MG tablet TAKE 1 TABLET BY MOUTH TWICE DAILY AS NEEDED FOR NAUSEA OR VOMITING 20 tablet 0  . ONETOUCH DELICA LANCETS FINE MISC 1 Device by Does not apply route daily as needed. (Patient taking differently: 1 Device by Does not apply route daily as needed (blood glucose).) 100 each 3  . pantoprazole (PROTONIX) 40 MG  tablet Take 1 tablet (40 mg total) by mouth daily. 30 tablet 0  . rosuvastatin (CRESTOR) 10 MG tablet Take 10 mg by mouth daily.    . sodium phosphate (FLEET) 7-19 GM/118ML ENEM Place 133 mLs (1 enema total) rectally daily as needed for severe constipation. 133 mL 0  . thiamine 100 MG tablet Take 1 tablet (100 mg total) by mouth daily.     No current facility-administered medications for this visit.    REVIEW OF SYSTEMS:   Constitutional: ( - ) fevers, ( - )  chills , ( - ) night sweats Eyes: ( - ) blurriness of vision, ( - ) double vision, ( - ) watery eyes Ears, nose, mouth, throat, and face: ( - ) mucositis, ( - ) sore throat Respiratory: ( - ) cough, ( - ) dyspnea, ( - ) wheezes Cardiovascular: ( - ) palpitation, ( - ) chest discomfort, ( - ) lower extremity swelling Gastrointestinal:  ( - ) nausea, ( - ) heartburn, ( +)change in bowel habits Skin: ( - ) abnormal skin rashes Lymphatics: ( - ) new lymphadenopathy, ( - ) easy bruising Neurological: ( - ) numbness, ( - ) tingling, ( - ) new weaknesses Behavioral/Psych: ( - ) mood change, ( - ) new changes  All other systems were reviewed with the patient and are negative.  PHYSICAL EXAMINATION: ECOG PERFORMANCE STATUS: 3 - Symptomatic, >50% confined to bed  Vitals:   03/31/21 0907  BP: (!) 175/87  Pulse: 79  Resp: 17  Temp: (!) 96.2 F (35.7 C)  SpO2: 100%   Filed Weights   03/31/21 0907  Weight: 143 lb 11.2 oz (65.2 kg)    GENERAL: Elderly African American female in NAD  SKIN: skin color, texture, turgor are normal, no rashes or significant lesions EYES: conjunctiva are pink and non-injected, sclera clear OROPHARYNX: no exudate, no erythema; lips, buccal mucosa, and tongue normal   LUNGS: clear to auscultation and percussion with normal breathing effort HEART: regular rate & rhythm and no murmurs. Mild lower extremity edema ABDOMEN: soft, non-tender, non-distended, normal bowel sounds Musculoskeletal: no cyanosis of  digits and no clubbing  PSYCH: alert & oriented x 3, fluent speech  LABORATORY DATA:  I have reviewed the data as listed CBC Latest Ref Rng & Units 03/31/2021 03/21/2021 03/21/2021  WBC 4.0 - 10.5 K/uL 3.3(L) -  4.3  Hemoglobin 12.0 - 15.0 g/dL 8.2(L) 7.1(L) 7.8(L)  Hematocrit 36.0 - 46.0 % 26.3(L) 21.0(L) 25.3(L)  Platelets 150 - 400 K/uL 193 - 155    CMP Latest Ref Rng & Units 03/31/2021 03/21/2021 03/21/2021  Glucose 70 - 99 mg/dL 80 79 74  BUN 8 - 23 mg/dL 12 23 22   Creatinine 0.44 - 1.00 mg/dL 0.76 0.80 0.79  Sodium 135 - 145 mmol/L 140 136 133(L)  Potassium 3.5 - 5.1 mmol/L 4.0 4.2 4.2  Chloride 98 - 111 mmol/L 107 104 102  CO2 22 - 32 mmol/L 24 - 21(L)  Calcium 8.9 - 10.3 mg/dL 9.2 - 8.5(L)  Total Protein 6.5 - 8.1 g/dL 7.4 - 6.5  Total Bilirubin 0.3 - 1.2 mg/dL 0.3 - 0.7  Alkaline Phos 38 - 126 U/L 67 - 50  AST 15 - 41 U/L 29 - 26  ALT 0 - 44 U/L 8 - 8    RADIOGRAPHIC STUDIES: I have personally reviewed the radiological images as listed and agreed with the findings in the report. CT ABDOMEN PELVIS WO CONTRAST  Result Date: 03/21/2021 CLINICAL DATA:  Bowel obstruction suspected Patient brought to the emergency room due to possible low iron, hemoglobin and hematocrit. Last bowel movement 3 days ago. EXAM: CT ABDOMEN AND PELVIS WITHOUT CONTRAST TECHNIQUE: Multidetector CT imaging of the abdomen and pelvis was performed following the standard protocol without IV contrast. COMPARISON:  Most recent abdominal CT 01/08/2021 FINDINGS: Lower chest: Motion artifact through the lung bases. No obvious focal airspace disease. Dense mitral annulus calcifications. There are coronary artery calcifications. Hepatobiliary: Motion artifact through the liver which limits assessment. There is central intrahepatic biliary ductal dilatation. Post cholecystectomy. Dilated common bile duct at 18 mm. A least 3 intraluminal stones within the common bile duct, largest measuring 13 and 14 mm, series 6, image 39.  No definite stone at the ampulla. Stones are previously seen more proximally in the common bile duct. Pancreas: No ductal dilatation or inflammation. Spleen: Normal in size without focal abnormality. Adrenals/Urinary Tract: No adrenal nodule. No hydronephrosis or perinephric edema. No renal calculi or evidence of focal renal lesion. Unremarkable urinary bladder. Stomach/Bowel: Detailed bowel assessment limited in the absence of enteric contrast. Small hiatal hernia. Small duodenal diverticulum. No small bowel obstruction, dilatation, or evidence of wall thickening. Circumferential wall thickening of the rectum with stool distending the rectum and perirectal soft tissue edema. Small volume of stool in the more proximal colon. Sigmoid colon is redundant and courses into the right abdomen. Moderate diverticulosis in the distal descending and sigmoid colon without diverticulitis. Mild wall thickening of the ascending colon versus nondistention, series 3, image 33. Vascular/Lymphatic: Aortic and branch atherosclerosis. Aortic tortuosity without aneurysm. No bulky abdominopelvic adenopathy. Reproductive: Hysterectomy. 18 mm cyst in the left adnexa, stable from prior exam. Right ovary is quiescent. Other: Presacral soft tissue edema. No free air or free fluid. Postsurgical change of the anterior abdominal wall without abdominal wall hernia. Musculoskeletal: Scoliosis and multilevel degenerative change in the spine. Degenerative change of the pubic symphysis. There are no acute or suspicious osseous abnormalities. IMPRESSION: 1. Stool distending the rectum with circumferential wall thickening and perirectal soft tissue edema, fecal impaction versus proctitis. Small volume of stool in the more proximal colon. 2. Mild wall thickening of the ascending colon versus nondistention. Colonic diverticulosis without diverticulitis. 3. Post cholecystectomy. At least 3 intraluminal stones within the common bile duct, largest measuring  13 and 14 mm. There is intra and extrahepatic biliary ductal  dilatation. While MRCP is the study of choice for further evaluation, this is not recommended in this elderly patient was unable to remain motionless for CT. 4. Stable 18 mm cyst in the left adnexa. No follow-up imaging recommended. Note: This recommendation does not apply to premenarchal patients and to those with increased risk (genetic, family history, elevated tumor markers or other high-risk factors) of ovarian cancer. Reference: JACR 2020 Feb; 17(2):248-254 Aortic Atherosclerosis (ICD10-I70.0). Electronically Signed   By: Keith Rake M.D.   On: 03/21/2021 20:30   DG Chest Port 1 View  Result Date: 03/21/2021 CLINICAL DATA:  Weakness and fatigue. EXAM: PORTABLE CHEST 1 VIEW COMPARISON:  Radiograph 01/08/2021 FINDINGS: Mild cardiomegaly. Aortic atherosclerosis and tortuosity. Low lung volumes with subsegmental atelectasis at the right lung base. Mild linear scarring at the left lung base. No confluent consolidation, pneumothorax, or significant pleural effusion. Scoliotic curvature of the spine. IMPRESSION: 1. Low lung volumes with subsegmental right basilar atelectasis. 2. Mild cardiomegaly.  Aortic Atherosclerosis (ICD10-I70.0). Electronically Signed   By: Keith Rake M.D.   On: 03/21/2021 17:32    ASSESSMENT & PLAN [name] is a 87 y.o. female presenting to the clinic for evaluation for anemia. Reviewed outside labs including most recent labs from 03/21/2021 that indicates macrocytic anemia. Patient denies any active signs of bleeding although daughter notes that rehab nurse mentioned seeing blood in stool after enema recently. Patient's most recent colonoscopy was over ten years ago per daughter. It is uncertain the etiology but differentials include iron, vitamin B12, folate deficiencies. Additionally,this could be anemia from chronic disease with CKD and rheumatoid arthritis.   The plan will be to proceed with further  workup to determine etiology with labs to check CBC, CMP, iron and TIBC, retic panel, ferritin, vitamin B12, folate, erythropoietin, ESR and CRP levels.   #Anemia, unknown etiology: --Labs today to recheck CBC, CMP --Evaluate for iron deficiency anemia with iron and TIBC, retic panel, ferritin --Evaluate for nutritional anemias with vitamin B12, folate --Evaluate for chronic disease with erythropoietin, ESR and CRP levels.  --Patient is currently on oral ferrous sulfate 325 mg. Recommend to continue for now until we determine underlying cause of anemia. If there is evidence of iron deficiency anemia, recommend IV iron infusion. --Recommend blood transfusion if hemoglobin is <7.  --RTC based on above workup.   Orders Placed This Encounter  Procedures  . CBC with Differential (Cancer Center Only)    Standing Status:   Future    Number of Occurrences:   1    Standing Expiration Date:   03/31/2022  . CMP ([facility] only)    Standing Status:   Future    Number of Occurrences:   1    Standing Expiration Date:   03/31/2022  . Ferritin    Standing Status:   Future    Number of Occurrences:   1    Standing Expiration Date:   03/31/2022  . Iron and TIBC    Standing Status:   Future    Number of Occurrences:   1    Standing Expiration Date:   03/31/2022  . Retic Panel    Standing Status:   Future    Number of Occurrences:   1    Standing Expiration Date:   03/31/2022  . Sedimentation rate    Standing Status:   Future    Number of Occurrences:   1    Standing Expiration Date:   03/31/2022  . C-reactive protein  Standing Status:   Future    Number of Occurrences:   1    Standing Expiration Date:   03/31/2022  . Erythropoietin    Standing Status:   Future    Number of Occurrences:   1    Standing Expiration Date:   03/31/2022  . Sample to Blood Bank    Standing Status:   Future    Number of Occurrences:   1    Standing Expiration Date:   03/31/2022    All questions were answered.  The patient knows to call the clinic with any problems, questions or concerns.  I have spent a total of 60 minutes minutes of face-to-face and non-face-to-face time, preparing to see the patient, obtaining and/or reviewing separately obtained history, performing a medically appropriate examination, counseling and educating the patient, ordering tests,  documenting clinical information in the electronic health record and care coordination.   Dede Query, PA-C Department of Hematology/Oncology [place] at [facility] Phone: [phone]

## 2021-04-03 DIAGNOSIS — N183 Chronic kidney disease, stage 3 unspecified: Secondary | ICD-10-CM | POA: Diagnosis not present

## 2021-04-03 DIAGNOSIS — Z8616 Personal history of COVID-19: Secondary | ICD-10-CM | POA: Diagnosis not present

## 2021-04-03 DIAGNOSIS — R2681 Unsteadiness on feet: Secondary | ICD-10-CM | POA: Diagnosis not present

## 2021-04-03 DIAGNOSIS — F329 Major depressive disorder, single episode, unspecified: Secondary | ICD-10-CM | POA: Diagnosis not present

## 2021-04-03 DIAGNOSIS — I10 Essential (primary) hypertension: Secondary | ICD-10-CM | POA: Diagnosis not present

## 2021-04-03 DIAGNOSIS — M6281 Muscle weakness (generalized): Secondary | ICD-10-CM | POA: Diagnosis not present

## 2021-04-03 DIAGNOSIS — E119 Type 2 diabetes mellitus without complications: Secondary | ICD-10-CM | POA: Diagnosis not present

## 2021-04-03 DIAGNOSIS — I2510 Atherosclerotic heart disease of native coronary artery without angina pectoris: Secondary | ICD-10-CM | POA: Diagnosis not present

## 2021-04-03 DIAGNOSIS — I5032 Chronic diastolic (congestive) heart failure: Secondary | ICD-10-CM | POA: Diagnosis not present

## 2021-04-03 LAB — ERYTHROPOIETIN: Erythropoietin: 32.2 m[IU]/mL — ABNORMAL HIGH (ref 2.6–18.5)

## 2021-04-04 ENCOUNTER — Telehealth: Payer: Self-pay | Admitting: Physician Assistant

## 2021-04-04 ENCOUNTER — Telehealth: Payer: Self-pay

## 2021-04-04 NOTE — Telephone Encounter (Signed)
I called patient's daughter, [name], to review lab results after our consultation on 03/31/2021. CBC indicated macrocytic anemia with hemoglobin of 8.2 and MCV of 100.8. Iron panel did not indicate deficiency so recommend to discontinue oral iron tablets as patient has severe constipation. B12 and folate levels did not indicate deficiency. Sed rate was elevated at 125 mm/hr. This suggests inflammatory process is present, likely rheumatoid arthritis. Based on results, a likely cause for anemia is RA and methotrexate. Patient is scheduled to see rheumatologist, Dr. Kathlene November end of this month. I recommend for patient to discuss alternative medication for RA as this could be causing the anemia. Recommend for patient to return to clinic in 3 months. If unable to change medications for RA or if anemia worsens, we will need to consider a bone marrow biopsy and EPO injections.   Patient's daughter expressed understanding and satisfaction with the plan provided.

## 2021-04-04 NOTE — Telephone Encounter (Signed)
Per Georga Kaufmann, PA-C, I contacted [facility] to advise this Patient's nurse to discontinue oral iron. I advised the Nurse that [name] stated at this time the patient's labs did not indicate iron deficiency. The nurse verbalized understanding and stated that she would make the change in the patient's chart.

## 2021-04-05 ENCOUNTER — Other Ambulatory Visit: Payer: Medicare HMO

## 2021-04-05 ENCOUNTER — Encounter: Payer: Medicare HMO | Admitting: Physician Assistant

## 2021-04-06 ENCOUNTER — Encounter (HOSPITAL_COMMUNITY): Payer: Medicare HMO

## 2021-04-10 DIAGNOSIS — Z8616 Personal history of COVID-19: Secondary | ICD-10-CM | POA: Diagnosis not present

## 2021-04-10 DIAGNOSIS — E119 Type 2 diabetes mellitus without complications: Secondary | ICD-10-CM | POA: Diagnosis not present

## 2021-04-10 DIAGNOSIS — F329 Major depressive disorder, single episode, unspecified: Secondary | ICD-10-CM | POA: Diagnosis not present

## 2021-04-10 DIAGNOSIS — I5032 Chronic diastolic (congestive) heart failure: Secondary | ICD-10-CM | POA: Diagnosis not present

## 2021-04-10 DIAGNOSIS — R2681 Unsteadiness on feet: Secondary | ICD-10-CM | POA: Diagnosis not present

## 2021-04-10 DIAGNOSIS — M6281 Muscle weakness (generalized): Secondary | ICD-10-CM | POA: Diagnosis not present

## 2021-04-10 DIAGNOSIS — N183 Chronic kidney disease, stage 3 unspecified: Secondary | ICD-10-CM | POA: Diagnosis not present

## 2021-04-10 DIAGNOSIS — I2510 Atherosclerotic heart disease of native coronary artery without angina pectoris: Secondary | ICD-10-CM | POA: Diagnosis not present

## 2021-04-10 DIAGNOSIS — I10 Essential (primary) hypertension: Secondary | ICD-10-CM | POA: Diagnosis not present

## 2021-04-11 DIAGNOSIS — I5032 Chronic diastolic (congestive) heart failure: Secondary | ICD-10-CM | POA: Diagnosis not present

## 2021-04-11 DIAGNOSIS — I152 Hypertension secondary to endocrine disorders: Secondary | ICD-10-CM | POA: Diagnosis not present

## 2021-04-11 DIAGNOSIS — K5901 Slow transit constipation: Secondary | ICD-10-CM | POA: Diagnosis not present

## 2021-04-11 DIAGNOSIS — G4089 Other seizures: Secondary | ICD-10-CM | POA: Diagnosis not present

## 2021-04-11 DIAGNOSIS — D649 Anemia, unspecified: Secondary | ICD-10-CM | POA: Diagnosis not present

## 2021-04-13 DIAGNOSIS — R2681 Unsteadiness on feet: Secondary | ICD-10-CM | POA: Diagnosis not present

## 2021-04-13 DIAGNOSIS — M6281 Muscle weakness (generalized): Secondary | ICD-10-CM | POA: Diagnosis not present

## 2021-04-13 DIAGNOSIS — R7981 Abnormal blood-gas level: Secondary | ICD-10-CM | POA: Diagnosis not present

## 2021-04-13 DIAGNOSIS — I2510 Atherosclerotic heart disease of native coronary artery without angina pectoris: Secondary | ICD-10-CM | POA: Diagnosis not present

## 2021-04-13 DIAGNOSIS — E119 Type 2 diabetes mellitus without complications: Secondary | ICD-10-CM | POA: Diagnosis not present

## 2021-04-13 DIAGNOSIS — Z8616 Personal history of COVID-19: Secondary | ICD-10-CM | POA: Diagnosis not present

## 2021-04-13 DIAGNOSIS — N183 Chronic kidney disease, stage 3 unspecified: Secondary | ICD-10-CM | POA: Diagnosis not present

## 2021-04-13 DIAGNOSIS — F329 Major depressive disorder, single episode, unspecified: Secondary | ICD-10-CM | POA: Diagnosis not present

## 2021-04-13 DIAGNOSIS — I10 Essential (primary) hypertension: Secondary | ICD-10-CM | POA: Diagnosis not present

## 2021-04-13 DIAGNOSIS — I5032 Chronic diastolic (congestive) heart failure: Secondary | ICD-10-CM | POA: Diagnosis not present

## 2021-04-17 DIAGNOSIS — F329 Major depressive disorder, single episode, unspecified: Secondary | ICD-10-CM | POA: Diagnosis not present

## 2021-04-17 DIAGNOSIS — I2510 Atherosclerotic heart disease of native coronary artery without angina pectoris: Secondary | ICD-10-CM | POA: Diagnosis not present

## 2021-04-17 DIAGNOSIS — I5032 Chronic diastolic (congestive) heart failure: Secondary | ICD-10-CM | POA: Diagnosis not present

## 2021-04-17 DIAGNOSIS — E119 Type 2 diabetes mellitus without complications: Secondary | ICD-10-CM | POA: Diagnosis not present

## 2021-04-17 DIAGNOSIS — N183 Chronic kidney disease, stage 3 unspecified: Secondary | ICD-10-CM | POA: Diagnosis not present

## 2021-04-17 DIAGNOSIS — I10 Essential (primary) hypertension: Secondary | ICD-10-CM | POA: Diagnosis not present

## 2021-04-17 DIAGNOSIS — R2681 Unsteadiness on feet: Secondary | ICD-10-CM | POA: Diagnosis not present

## 2021-04-17 DIAGNOSIS — M6281 Muscle weakness (generalized): Secondary | ICD-10-CM | POA: Diagnosis not present

## 2021-04-17 DIAGNOSIS — Z8616 Personal history of COVID-19: Secondary | ICD-10-CM | POA: Diagnosis not present

## 2021-04-18 DIAGNOSIS — R002 Palpitations: Secondary | ICD-10-CM | POA: Diagnosis not present

## 2021-04-19 DIAGNOSIS — G4089 Other seizures: Secondary | ICD-10-CM | POA: Diagnosis not present

## 2021-04-19 DIAGNOSIS — F3289 Other specified depressive episodes: Secondary | ICD-10-CM | POA: Diagnosis not present

## 2021-04-19 DIAGNOSIS — D6489 Other specified anemias: Secondary | ICD-10-CM | POA: Diagnosis not present

## 2021-04-24 ENCOUNTER — Encounter: Payer: Self-pay | Admitting: *Deleted

## 2021-04-25 ENCOUNTER — Telehealth: Payer: Self-pay

## 2021-04-25 NOTE — Telephone Encounter (Signed)
I contacted [facility] at [phone]. [phone] and spoke with a representative to relay the message below in regards to an alternative medication for the patient's RA. I was told that I would receive a call back today with an answer from Dr. Deanne Coffer.

## 2021-04-25 NOTE — Telephone Encounter (Signed)
-----   Message from Briant Cedar, PA-C sent at 04/25/2021 10:18 AM EDT ----- Mendel Corning,  I wanted to ask if Dr. Deanne Coffer could consider an alternative medication for patient's RA as this could be causing the anemia.   Thanks Karena Addison  ----- Message ----- From: Dionicio Stall, CMA Sent: 04/21/2021   2:57 PM EDT To: Briant Cedar, Earlie Counts,  I received a voicemail today from a representative with [facility]. She stated that she was returning your call about this patient who sees Dr. Deanne Coffer and is requesting a return call from you at [phone]. [phone]. Please let me know if there is anything that I can do.   Thanks, Baird Lyons

## 2021-04-26 ENCOUNTER — Telehealth: Payer: Self-pay

## 2021-04-26 NOTE — Telephone Encounter (Signed)
I called [facility] today to follow up on Dr. Dereck Ligas recommendations. I was advised that Dr. Deanne Coffer recommends the patient stop the methotrexate and their office will contact him for a follow up appointment. They requested her most recent office notes and labs which I have faxed to [facility] at [phone] with receipt of confirmation. Georga Kaufmann, PA has been made aware.

## 2021-05-01 DIAGNOSIS — F411 Generalized anxiety disorder: Secondary | ICD-10-CM | POA: Diagnosis not present

## 2021-05-01 DIAGNOSIS — F331 Major depressive disorder, recurrent, moderate: Secondary | ICD-10-CM | POA: Diagnosis not present

## 2021-05-02 ENCOUNTER — Ambulatory Visit: Payer: Medicare HMO | Admitting: Neurology

## 2021-05-08 ENCOUNTER — Other Ambulatory Visit: Payer: Self-pay

## 2021-05-08 ENCOUNTER — Emergency Department (HOSPITAL_COMMUNITY): Payer: Medicare HMO

## 2021-05-08 ENCOUNTER — Inpatient Hospital Stay (HOSPITAL_COMMUNITY)
Admission: EM | Admit: 2021-05-08 | Discharge: 2021-05-11 | DRG: 871 | Disposition: A | Discharge status: Skilled Nursing Facility | Payer: Medicare HMO | Source: Skilled Nursing Facility | Attending: Internal Medicine | Admitting: Internal Medicine

## 2021-05-08 ENCOUNTER — Encounter (HOSPITAL_COMMUNITY): Payer: Self-pay

## 2021-05-08 DIAGNOSIS — Z79899 Other long term (current) drug therapy: Secondary | ICD-10-CM

## 2021-05-08 DIAGNOSIS — K219 Gastro-esophageal reflux disease without esophagitis: Secondary | ICD-10-CM | POA: Diagnosis present

## 2021-05-08 DIAGNOSIS — Z9102 Food additives allergy status: Secondary | ICD-10-CM

## 2021-05-08 DIAGNOSIS — R5383 Other fatigue: Secondary | ICD-10-CM | POA: Diagnosis not present

## 2021-05-08 DIAGNOSIS — I130 Hypertensive heart and chronic kidney disease with heart failure and stage 1 through stage 4 chronic kidney disease, or unspecified chronic kidney disease: Secondary | ICD-10-CM | POA: Diagnosis not present

## 2021-05-08 DIAGNOSIS — G40909 Epilepsy, unspecified, not intractable, without status epilepticus: Secondary | ICD-10-CM | POA: Diagnosis not present

## 2021-05-08 DIAGNOSIS — R5381 Other malaise: Secondary | ICD-10-CM | POA: Diagnosis not present

## 2021-05-08 DIAGNOSIS — F419 Anxiety disorder, unspecified: Secondary | ICD-10-CM | POA: Diagnosis present

## 2021-05-08 DIAGNOSIS — I2510 Atherosclerotic heart disease of native coronary artery without angina pectoris: Secondary | ICD-10-CM | POA: Diagnosis present

## 2021-05-08 DIAGNOSIS — R569 Unspecified convulsions: Secondary | ICD-10-CM

## 2021-05-08 DIAGNOSIS — Z888 Allergy status to other drugs, medicaments and biological substances status: Secondary | ICD-10-CM

## 2021-05-08 DIAGNOSIS — N179 Acute kidney failure, unspecified: Secondary | ICD-10-CM | POA: Diagnosis present

## 2021-05-08 DIAGNOSIS — E559 Vitamin D deficiency, unspecified: Secondary | ICD-10-CM | POA: Diagnosis present

## 2021-05-08 DIAGNOSIS — M109 Gout, unspecified: Secondary | ICD-10-CM | POA: Diagnosis present

## 2021-05-08 DIAGNOSIS — D638 Anemia in other chronic diseases classified elsewhere: Secondary | ICD-10-CM | POA: Diagnosis present

## 2021-05-08 DIAGNOSIS — G928 Other toxic encephalopathy: Secondary | ICD-10-CM | POA: Diagnosis present

## 2021-05-08 DIAGNOSIS — R531 Weakness: Secondary | ICD-10-CM | POA: Diagnosis not present

## 2021-05-08 DIAGNOSIS — E785 Hyperlipidemia, unspecified: Secondary | ICD-10-CM | POA: Diagnosis present

## 2021-05-08 DIAGNOSIS — R4182 Altered mental status, unspecified: Secondary | ICD-10-CM | POA: Diagnosis not present

## 2021-05-08 DIAGNOSIS — D6959 Other secondary thrombocytopenia: Secondary | ICD-10-CM | POA: Diagnosis present

## 2021-05-08 DIAGNOSIS — N1831 Chronic kidney disease, stage 3a: Secondary | ICD-10-CM | POA: Diagnosis present

## 2021-05-08 DIAGNOSIS — Z66 Do not resuscitate: Secondary | ICD-10-CM | POA: Diagnosis not present

## 2021-05-08 DIAGNOSIS — Z20822 Contact with and (suspected) exposure to covid-19: Secondary | ICD-10-CM | POA: Diagnosis present

## 2021-05-08 DIAGNOSIS — I5032 Chronic diastolic (congestive) heart failure: Secondary | ICD-10-CM | POA: Diagnosis present

## 2021-05-08 DIAGNOSIS — M810 Age-related osteoporosis without current pathological fracture: Secondary | ICD-10-CM | POA: Diagnosis present

## 2021-05-08 DIAGNOSIS — D649 Anemia, unspecified: Secondary | ICD-10-CM | POA: Diagnosis not present

## 2021-05-08 DIAGNOSIS — Z8673 Personal history of transient ischemic attack (TIA), and cerebral infarction without residual deficits: Secondary | ICD-10-CM

## 2021-05-08 DIAGNOSIS — N390 Urinary tract infection, site not specified: Secondary | ICD-10-CM | POA: Diagnosis not present

## 2021-05-08 DIAGNOSIS — I152 Hypertension secondary to endocrine disorders: Secondary | ICD-10-CM | POA: Diagnosis not present

## 2021-05-08 DIAGNOSIS — Z8616 Personal history of COVID-19: Secondary | ICD-10-CM

## 2021-05-08 DIAGNOSIS — E538 Deficiency of other specified B group vitamins: Secondary | ICD-10-CM | POA: Diagnosis present

## 2021-05-08 DIAGNOSIS — Z885 Allergy status to narcotic agent status: Secondary | ICD-10-CM

## 2021-05-08 DIAGNOSIS — R4189 Other symptoms and signs involving cognitive functions and awareness: Secondary | ICD-10-CM | POA: Diagnosis not present

## 2021-05-08 DIAGNOSIS — E860 Dehydration: Secondary | ICD-10-CM | POA: Diagnosis present

## 2021-05-08 DIAGNOSIS — A419 Sepsis, unspecified organism: Secondary | ICD-10-CM | POA: Diagnosis not present

## 2021-05-08 DIAGNOSIS — I959 Hypotension, unspecified: Secondary | ICD-10-CM | POA: Diagnosis not present

## 2021-05-08 DIAGNOSIS — Z7982 Long term (current) use of aspirin: Secondary | ICD-10-CM

## 2021-05-08 DIAGNOSIS — E1159 Type 2 diabetes mellitus with other circulatory complications: Secondary | ICD-10-CM | POA: Diagnosis not present

## 2021-05-08 DIAGNOSIS — I712 Thoracic aortic aneurysm, without rupture: Secondary | ICD-10-CM | POA: Diagnosis present

## 2021-05-08 DIAGNOSIS — I252 Old myocardial infarction: Secondary | ICD-10-CM

## 2021-05-08 DIAGNOSIS — A4159 Other Gram-negative sepsis: Secondary | ICD-10-CM | POA: Diagnosis present

## 2021-05-08 DIAGNOSIS — F32A Depression, unspecified: Secondary | ICD-10-CM | POA: Diagnosis present

## 2021-05-08 DIAGNOSIS — I700 Atherosclerosis of aorta: Secondary | ICD-10-CM | POA: Diagnosis not present

## 2021-05-08 DIAGNOSIS — M1990 Unspecified osteoarthritis, unspecified site: Secondary | ICD-10-CM | POA: Diagnosis not present

## 2021-05-08 DIAGNOSIS — Z955 Presence of coronary angioplasty implant and graft: Secondary | ICD-10-CM

## 2021-05-08 DIAGNOSIS — Z8249 Family history of ischemic heart disease and other diseases of the circulatory system: Secondary | ICD-10-CM

## 2021-05-08 DIAGNOSIS — I4510 Unspecified right bundle-branch block: Secondary | ICD-10-CM | POA: Diagnosis not present

## 2021-05-08 DIAGNOSIS — R404 Transient alteration of awareness: Secondary | ICD-10-CM | POA: Diagnosis not present

## 2021-05-08 DIAGNOSIS — Z743 Need for continuous supervision: Secondary | ICD-10-CM | POA: Diagnosis not present

## 2021-05-08 LAB — COMPREHENSIVE METABOLIC PANEL
ALT: 7 U/L (ref 0–44)
AST: 24 U/L (ref 15–41)
Albumin: 2.4 g/dL — ABNORMAL LOW (ref 3.5–5.0)
Alkaline Phosphatase: 48 U/L (ref 38–126)
Anion gap: 10 (ref 5–15)
BUN: 24 mg/dL — ABNORMAL HIGH (ref 8–23)
CO2: 23 mmol/L (ref 22–32)
Calcium: 8.8 mg/dL — ABNORMAL LOW (ref 8.9–10.3)
Chloride: 105 mmol/L (ref 98–111)
Creatinine, Ser: 1.22 mg/dL — ABNORMAL HIGH (ref 0.44–1.00)
GFR, Estimated: 43 mL/min — ABNORMAL LOW (ref >60)
Glucose, Bld: 128 mg/dL — ABNORMAL HIGH (ref 70–99)
Potassium: 4.1 mmol/L (ref 3.5–5.1)
Sodium: 138 mmol/L (ref 135–145)
Total Bilirubin: 0.9 mg/dL (ref 0.3–1.2)
Total Protein: 6.4 g/dL — ABNORMAL LOW (ref 6.5–8.1)

## 2021-05-08 LAB — URINALYSIS, ROUTINE W REFLEX MICROSCOPIC
Bilirubin Urine: NEGATIVE
Glucose, UA: NEGATIVE mg/dL
Hgb urine dipstick: NEGATIVE
Ketones, ur: 5 mg/dL — AB
Nitrite: NEGATIVE
Protein, ur: NEGATIVE mg/dL
Specific Gravity, Urine: 1.018 (ref 1.005–1.030)
pH: 5 (ref 5.0–8.0)

## 2021-05-08 LAB — CBC WITH DIFFERENTIAL/PLATELET
Abs Immature Granulocytes: 0 10*3/uL (ref 0.00–0.07)
Basophils Absolute: 0 10*3/uL (ref 0.0–0.1)
Basophils Relative: 0 %
Eosinophils Absolute: 0 10*3/uL (ref 0.0–0.5)
Eosinophils Relative: 0 %
HCT: 25.8 % — ABNORMAL LOW (ref 36.0–46.0)
Hemoglobin: 8.1 g/dL — ABNORMAL LOW (ref 12.0–15.0)
Lymphocytes Relative: 12 %
Lymphs Abs: 0.9 10*3/uL (ref 0.7–4.0)
MCH: 33.1 pg (ref 26.0–34.0)
MCHC: 31.4 g/dL (ref 30.0–36.0)
MCV: 105.3 fL — ABNORMAL HIGH (ref 80.0–100.0)
Monocytes Absolute: 0.2 10*3/uL (ref 0.1–1.0)
Monocytes Relative: 3 %
Neutro Abs: 6.5 10*3/uL (ref 1.7–7.7)
Neutrophils Relative %: 85 %
Platelets: 124 10*3/uL — ABNORMAL LOW (ref 150–400)
RBC: 2.45 MIL/uL — ABNORMAL LOW (ref 3.87–5.11)
RDW: 17.6 % — ABNORMAL HIGH (ref 11.5–15.5)
WBC: 7.6 10*3/uL (ref 4.0–10.5)
nRBC: 0 % (ref 0.0–0.2)
nRBC: 1 /100 WBC — ABNORMAL HIGH

## 2021-05-08 LAB — CBG MONITORING, ED: Glucose-Capillary: 118 mg/dL — ABNORMAL HIGH (ref 70–99)

## 2021-05-08 LAB — VALPROIC ACID LEVEL: Valproic Acid Lvl: 44 ug/mL — ABNORMAL LOW (ref 50.0–100.0)

## 2021-05-08 LAB — TROPONIN I (HIGH SENSITIVITY)
Troponin I (High Sensitivity): 36 ng/L — ABNORMAL HIGH (ref <18)
Troponin I (High Sensitivity): 31 ng/L — ABNORMAL HIGH (ref <18)

## 2021-05-08 LAB — LACTIC ACID, PLASMA: Lactic Acid, Venous: 1 mmol/L (ref 0.5–1.9)

## 2021-05-08 MED ORDER — SODIUM CHLORIDE 0.9 % IV SOLN
1.0000 g | INTRAVENOUS | Status: DC
  Administered 2021-05-09 – 2021-05-11 (×3): 1 g via INTRAVENOUS
  Filled 2021-05-08 (×3): qty 10

## 2021-05-08 MED ORDER — ACETAMINOPHEN 325 MG PO TABS
650.0000 mg | ORAL_TABLET | Freq: Four times a day (QID) | ORAL | Status: DC | PRN
  Administered 2021-05-09 – 2021-05-11 (×3): 650 mg via ORAL
  Filled 2021-05-08 (×3): qty 2

## 2021-05-08 MED ORDER — PANTOPRAZOLE SODIUM 40 MG PO TBEC
40.0000 mg | DELAYED_RELEASE_TABLET | Freq: Every day | ORAL | Status: DC
  Administered 2021-05-09 – 2021-05-11 (×3): 40 mg via ORAL
  Filled 2021-05-08 (×3): qty 1

## 2021-05-08 MED ORDER — LACTATED RINGERS IV SOLN: INTRAVENOUS | Status: DC

## 2021-05-08 MED ORDER — VITAMIN D 25 MCG (1000 UNIT) PO TABS
1000.0000 [IU] | ORAL_TABLET | Freq: Every day | ORAL | Status: DC
  Administered 2021-05-09 – 2021-05-11 (×3): 1000 [IU] via ORAL
  Filled 2021-05-08 (×5): qty 1

## 2021-05-08 MED ORDER — SODIUM CHLORIDE 0.9 % IV SOLN
2.0000 g | Freq: Once | INTRAVENOUS | Status: AC
  Administered 2021-05-08: 2 g via INTRAVENOUS
  Filled 2021-05-08: qty 2

## 2021-05-08 MED ORDER — CITALOPRAM HYDROBROMIDE 20 MG PO TABS
10.0000 mg | ORAL_TABLET | Freq: Every day | ORAL | Status: DC
  Administered 2021-05-09 – 2021-05-11 (×4): 10 mg via ORAL
  Filled 2021-05-08 (×4): qty 1

## 2021-05-08 MED ORDER — FERROUS SULFATE 325 (65 FE) MG PO TABS
325.0000 mg | ORAL_TABLET | Freq: Every day | ORAL | Status: DC
  Administered 2021-05-09 – 2021-05-11 (×3): 325 mg via ORAL
  Filled 2021-05-08 (×3): qty 1

## 2021-05-08 MED ORDER — B COMPLEX-C PO TABS
1.0000 | ORAL_TABLET | Freq: Every day | ORAL | Status: DC
  Administered 2021-05-09 – 2021-05-11 (×3): 1 via ORAL
  Filled 2021-05-08 (×3): qty 1

## 2021-05-08 MED ORDER — SENNOSIDES-DOCUSATE SODIUM 8.6-50 MG PO TABS
2.0000 | ORAL_TABLET | Freq: Two times a day (BID) | ORAL | Status: DC
  Administered 2021-05-09 – 2021-05-11 (×7): 2 via ORAL
  Filled 2021-05-08 (×7): qty 2

## 2021-05-08 MED ORDER — DIVALPROEX SODIUM 250 MG PO DR TAB
500.0000 mg | DELAYED_RELEASE_TABLET | Freq: Two times a day (BID) | ORAL | Status: DC
  Administered 2021-05-09 – 2021-05-11 (×6): 500 mg via ORAL
  Filled 2021-05-08 (×7): qty 2

## 2021-05-08 MED ORDER — VANCOMYCIN VARIABLE DOSE PER UNSTABLE RENAL FUNCTION (PHARMACIST DOSING): Status: DC

## 2021-05-08 MED ORDER — METRONIDAZOLE 500 MG/100ML IV SOLN
500.0000 mg | Freq: Once | INTRAVENOUS | Status: AC
  Administered 2021-05-08: 500 mg via INTRAVENOUS
  Filled 2021-05-08: qty 100

## 2021-05-08 MED ORDER — ASPIRIN EC 81 MG PO TBEC
81.0000 mg | DELAYED_RELEASE_TABLET | Freq: Every day | ORAL | Status: DC
  Administered 2021-05-09 – 2021-05-11 (×3): 81 mg via ORAL
  Filled 2021-05-08 (×3): qty 1

## 2021-05-08 MED ORDER — SODIUM CHLORIDE 0.9 % IV SOLN: 2.0000 g | INTRAVENOUS | Status: DC

## 2021-05-08 MED ORDER — VANCOMYCIN HCL 1250 MG/250ML IV SOLN
1250.0000 mg | Freq: Once | INTRAVENOUS | Status: AC
  Administered 2021-05-08: 1250 mg via INTRAVENOUS
  Filled 2021-05-08: qty 250

## 2021-05-08 MED ORDER — LACTATED RINGERS IV SOLN: INTRAVENOUS | Status: AC

## 2021-05-08 MED ORDER — ACETAMINOPHEN 650 MG RE SUPP: 650.0000 mg | Freq: Four times a day (QID) | RECTAL | Status: DC | PRN

## 2021-05-08 MED ORDER — LACTATED RINGERS IV BOLUS (SEPSIS)
1000.0000 mL | Freq: Once | INTRAVENOUS | Status: AC
Start: 2021-05-08 — End: 2021-05-08
  Administered 2021-05-08: 1000 mL via INTRAVENOUS

## 2021-05-08 MED ORDER — ENOXAPARIN SODIUM 30 MG/0.3ML IJ SOSY
30.0000 mg | PREFILLED_SYRINGE | INTRAMUSCULAR | Status: DC
  Administered 2021-05-09: 30 mg via SUBCUTANEOUS
  Filled 2021-05-08: qty 0.3

## 2021-05-08 NOTE — ED Notes (Signed)
Patient transported to CT 

## 2021-05-08 NOTE — H&P (Signed)
History and Physical    [name] HDQ:[id number] DOB: 1933-05-27 DOA: 05/08/2021  PCP: Tresa Garter, MD Patient coming from: [facility]  Chief Complaint: Altered mental status  HPI: [name] is a 87 y.o. female with medical history significant of CAD status post PCI, chronic diastolic heart failure, TIA, hypertension, hyperlipidemia, diet-controlled type 2 diabetes, CKD stage II-IIIa, rheumatoid arthritis, depression, seizure disorder, GERD presented to the ED via EMS from [facility] for evaluation of altered mental status/lethargy.  Facility reported that since 6 PM yesterday patient has been more lethargic and sleepy than usual.  No other symptoms reported.  Patient came with a MOST form which indicated that she would want IV fluids and medications but is a DNR.  In the ED, rectal temperature 100.7 F and blood pressure soft at 98/63.  Code sepsis was initiated.  Patient was given 1 L fluid bolus and broad-spectrum antibiotics administered.  Labs showing WBC 7.6, hemoglobin 8.1 (stable), platelet count 124K.  Sodium 138, potassium 4.1, chloride 105, bicarb 23, BUN 24, creatinine 1.2 (baseline 0.7-0.9), glucose 128.  LFTs normal.  Lactic acid 1.0.  High-sensitivity troponin 36>31.  EKG without acute ischemic changes.  Valproic acid level 44.  Blood culture pending.  UA with moderate amount of leukocytes, 21-50 WBCs, and many bacteria.  Chest x-ray not suggestive of pneumonia.  Head CT negative for acute finding.  Patient is currently very somnolent.  History provided by daughter at bedside who states that 2 days ago when family went to visit the patient she was in her normal state of health, awake, alert, and talking.  Today when the patient's granddaughter went to visit her she noticed that she was very lethargic and not talking.  Per daughter, facility has not reported any seizure-like activity.  Review of Systems:  All systems reviewed and apart from history of presenting  illness, are negative.  Past Medical History:  Diagnosis Date   Anemia    iron deficiency   Aneurysm, thoracic aortic (HCC)    Anxiety    B12 deficiency    CAD (coronary artery disease)    s/p stenting of LAD 1999- cath 5-08 EF normal LAD 30-40% restenosis. D1 50% D2 80% LCX & RCA minimal plaque   Chronic back pain    Constipation    Depression    GERD (gastroesophageal reflux disease)    Gout    HTN (hypertension)    Hyperlipemia    Obesity    Osteoporosis    Pancreatitis    Polyarthritis    DJD/ possible PMR   Renal insufficiency    Cr 1.2-1.3   Seizures (HCC)    Tinnitus    Urinary frequency    Vertigo    Vitamin D deficiency     Past Surgical History:  Procedure Laterality Date   ABDOMINAL HYSTERECTOMY     CARDIAC CATHETERIZATION  2008   L main 20%, LAD stent patent, D1 50%, D2 80% (small), RCA 20%, EF 55-60%   CHOLECYSTECTOMY     CORONARY ANGIOPLASTY WITH STENT PLACEMENT  1999   LAD stent   CORONARY STENT INTERVENTION N/A 10/16/2019   Procedure: CORONARY STENT INTERVENTION;  Surgeon: Marykay Lex, MD;  Location: [facility] INVASIVE CV LAB;  Service: Cardiovascular;  Laterality: N/A;   HEMORRHOID SURGERY     LAPAROSCOPIC LYSIS OF ADHESIONS N/A 03/24/2015   Procedure: LAPAROSCOPIC LYSIS OF ADHESIONS;  Surgeon: Luretha Murphy, MD;  Location: [facility] ORS;  Service: General;  Laterality: N/A;  LAPAROSCOPY N/A 03/24/2015   Procedure: LAPAROSCOPY DIAGNOSTIC;  Surgeon: Luretha Murphy, MD;  Location: [facility] ORS;  Service: General;  Laterality: N/A;   LAPAROTOMY N/A 03/24/2015   Procedure: LAPAROTOMY with decompression of bowel;  Surgeon: Luretha Murphy, MD;  Location: [facility] ORS;  Service: General;  Laterality: N/A;   LEFT HEART CATH AND CORONARY ANGIOGRAPHY N/A 10/16/2019   Procedure: LEFT HEART CATH AND CORONARY ANGIOGRAPHY;  Surgeon: Marykay Lex, MD;  Location: [facility] INVASIVE CV LAB;  Service: Cardiovascular;  Laterality: N/A;   TUBAL LIGATION       reports that she has never  smoked. She has never used smokeless tobacco. She reports that she does not drink alcohol and does not use drugs.  Allergies  Allergen Reactions   Ativan [Lorazepam] Other (See Comments)    Very confused   Tramadol Hcl Anxiety and Rash    Headache   Amlodipine Besylate Other (See Comments)     dizzy   Atenolol Other (See Comments)    Fatigue   Benazepril Cough   Benicar [Olmesartan Medoxomil] Other (See Comments)    HEADACHE   Cozaar Nausea And Vomiting   Hydralazine Other (See Comments)    Hair loss   Hydrochlorothiazide Other (See Comments)   Hydrochlorothiazide W-Triamterene Other (See Comments)     dizzy   Hydrocodone Other (See Comments)    HEADACHE   Hydroxyzine Pamoate Other (See Comments)    Per MAR   Iodine Other (See Comments)    Per MAR   Lisinopril Other (See Comments) and Cough    Tired & fatigue   Peach Flavor Itching   Penicillins Itching    Has patient had a PCN reaction causing immediate rash, facial/tongue/throat swelling, SOB or lightheadedness with hypotension: No Has patient had a PCN reaction causing severe rash involving mucus membranes or skin necrosis: No Has patient had a PCN reaction that required hospitalization No Has patient had a PCN reaction occurring within the last 10 years: Yes If all of the above answers are "NO", then may proceed with Cephalosporin use.  tolerates cephalosporins OK    Pravastatin Other (See Comments)    Myalgias-muscle pain   Prednisolone Nausea Only and Other (See Comments)    Upset stomach   Strawberry Flavor Itching   Tramadol Hcl Other (See Comments)    headache   Benadryl [Diphenhydramine] Itching and Palpitations    Family History  Adopted: Yes  Problem Relation Age of Onset   Diabetes Mother    Hypertension Father    Cancer Sister     Prior to Admission medications   Medication Sig Start Date End Date Taking? Authorizing Provider  acetaminophen (TYLENOL) 325 MG tablet Take 2 tablets (650 mg total)  by mouth every 6 (six) hours as needed for mild pain, headache or moderate pain. Patient taking differently: Take 500 mg by mouth in the morning and at bedtime. 01/14/21  Yes Ghimire, Werner Lean, MD  aspirin EC 81 MG tablet Take 1 tablet (81 mg total) by mouth daily. Swallow whole. 10/27/20  Yes Rollene Rotunda, MD  B Complex-C (B-COMPLEX WITH VITAMIN C) tablet Take 1 tablet by mouth daily.   Yes [provider]  carvedilol (COREG) 12.5 MG tablet Take 1 tablet (12.5 mg total) by mouth 2 (two) times daily with a meal. Patient taking differently: Take 25 mg by mouth 2 (two) times daily with a meal. 01/16/21  Yes Ghimire, Werner Lean, MD  cholecalciferol (VITAMIN D) 25 MCG (1000 UNIT) tablet Take 1,000  Units by mouth daily.   Yes [provider]  citalopram (CELEXA) 10 MG tablet Take 1 tablet (10 mg total) by mouth daily. 08/29/20 08/29/21 Yes Corwin Levins, MD  diclofenac Sodium (VOLTAREN) 1 % GEL Apply 1 application topically 2 (two) times daily as needed (pain).   Yes [provider]  divalproex (DEPAKOTE) 500 MG DR tablet Take 1 tablet (500 mg total) by mouth every 12 (twelve) hours. 01/14/21  Yes Ghimire, Werner Lean, MD  ferrous sulfate 325 (65 FE) MG tablet Take 1 tablet (325 mg total) by mouth daily. 03/21/21  Yes Charlynne Pander, MD  methotrexate 2.5 MG tablet Take 10 mg by mouth once a week. On Fridays 10/20/20  Yes [provider]  pantoprazole (PROTONIX) 40 MG tablet Take 1 tablet (40 mg total) by mouth daily. 02/09/20  Yes Leroy Sea, MD  polyethylene glycol (MIRALAX / GLYCOLAX) 17 g packet Take 17 g by mouth 3 (three) times a week.   Yes [provider]  rosuvastatin (CRESTOR) 10 MG tablet Take 10 mg by mouth daily.   Yes [provider]  senna-docusate (SENOKOT-S) 8.6-50 MG tablet Take 2 tablets by mouth 2 (two) times daily.   Yes [provider]  Blood Glucose Calibration (ACCU-CHEK AVIVA) SOLN Use as directed Patient not  taking: No sig reported 02/20/17   Plotnikov, [name], MD  feeding supplement, GLUCERNA SHAKE, (GLUCERNA SHAKE) LIQD Take 237 mLs by mouth 3 (three) times daily between meals. Patient not taking: No sig reported 02/09/20   Leroy Sea, MD  glucose blood (ONETOUCH VERIO) test strip Use as instructed Patient not taking: Reported on 05/08/2021 08/27/17   Plotnikov, [name], MD  ondansetron (ZOFRAN) 4 MG tablet TAKE 1 TABLET BY MOUTH TWICE DAILY AS NEEDED FOR NAUSEA OR VOMITING Patient not taking: No sig reported 02/01/21   Plotnikov, [name], MD  ONETOUCH DELICA LANCETS FINE MISC 1 Device by Does not apply route daily as needed. Patient not taking: No sig reported 09/21/16   Plotnikov, [name], MD  sodium phosphate (FLEET) 7-19 GM/118ML ENEM Place 133 mLs (1 enema total) rectally daily as needed for severe constipation. Patient not taking: No sig reported 03/21/21   Charlynne Pander, MD  thiamine 100 MG tablet Take 1 tablet (100 mg total) by mouth daily. Patient not taking: No sig reported 01/14/21   Maretta Bees, MD    Physical Exam: Vitals:   05/08/21 2045 05/08/21 2100 05/08/21 2115 05/08/21 2130  BP: (!) 105/43 (!) 129/53 (!) 105/42 (!) 105/45  Pulse: 67 65 66 67  Resp: (!) 21 14 13 13   Temp:    99.2 F (37.3 C)  TempSrc:    Oral  SpO2: 100% 100% 100% 99%  Weight:      Height:        Physical Exam Constitutional:      General: She is not in acute distress.    Appearance: She is not diaphoretic.  HENT:     Head: Normocephalic and atraumatic.     Mouth/Throat:     Comments: Very dry mucous membranes Eyes:     Conjunctiva/sclera: Conjunctivae normal.  Cardiovascular:     Rate and Rhythm: Normal rate and regular rhythm.     Pulses: Normal pulses.  Pulmonary:     Effort: Pulmonary effort is normal. No respiratory distress.     Breath sounds: No wheezing.  Abdominal:     General: Bowel sounds are normal. There is no  distension.     Palpations: Abdomen is soft.      Tenderness: There is no abdominal tenderness.  Musculoskeletal:        General: No swelling or tenderness.     Cervical back: Normal range of motion and neck supple.  Skin:    General: Skin is warm and dry.  Neurological:     Comments: Very somnolent but arousable Oriented to person and place Not following commands     Labs on Admission: I have personally reviewed following labs and imaging studies  CBC: Recent Labs  Lab 05/08/21 1434  WBC 7.6  NEUTROABS 6.5  HGB 8.1*  HCT 25.8*  MCV 105.3*  PLT 124*   Basic Metabolic Panel: Recent Labs  Lab 05/08/21 1434  NA 138  K 4.1  CL 105  CO2 23  GLUCOSE 128*  BUN 24*  CREATININE 1.22*  CALCIUM 8.8*   GFR: Estimated Creatinine Clearance: 29.8 mL/min (A) (by C-G formula based on SCr of 1.22 mg/dL (H)). Liver Function Tests: Recent Labs  Lab 05/08/21 1434  AST 24  ALT 7  ALKPHOS 48  BILITOT 0.9  PROT 6.4*  ALBUMIN 2.4*   No results for input(s): LIPASE, AMYLASE in the last 168 hours. No results for input(s): AMMONIA in the last 168 hours. Coagulation Profile: No results for input(s): INR, PROTIME in the last 168 hours. Cardiac Enzymes: No results for input(s): CKTOTAL, CKMB, CKMBINDEX, TROPONINI in the last 168 hours. BNP (last 3 results) No results for input(s): PROBNP in the last 8760 hours. HbA1C: No results for input(s): HGBA1C in the last 72 hours. CBG: Recent Labs  Lab 05/08/21 1504  GLUCAP 118*   Lipid Profile: No results for input(s): CHOL, HDL, LDLCALC, TRIG, CHOLHDL, LDLDIRECT in the last 72 hours. Thyroid Function Tests: No results for input(s): TSH, T4TOTAL, FREET4, T3FREE, THYROIDAB in the last 72 hours. Anemia Panel: No results for input(s): VITAMINB12, FOLATE, FERRITIN, TIBC, IRON, RETICCTPCT in the last 72 hours. Urine analysis:    Component Value Date/Time   COLORURINE AMBER (A) 05/08/2021 1950   APPEARANCEUR CLOUDY (A) 05/08/2021 1950   LABSPEC 1.018 05/08/2021 1950   LABSPEC  1.020 04/23/2006 1313   PHURINE 5.0 05/08/2021 1950   GLUCOSEU NEGATIVE 05/08/2021 1950   GLUCOSEU NEGATIVE 02/28/2016 1559   HGBUR NEGATIVE 05/08/2021 1950   BILIRUBINUR NEGATIVE 05/08/2021 1950   BILIRUBINUR Negative 04/23/2006 1313   KETONESUR 5 (A) 05/08/2021 1950   PROTEINUR NEGATIVE 05/08/2021 1950   UROBILINOGEN 0.2 02/28/2016 1559   NITRITE NEGATIVE 05/08/2021 1950   LEUKOCYTESUR MODERATE (A) 05/08/2021 1950   LEUKOCYTESUR Trace 04/23/2006 1313    Radiological Exams on Admission: CT Head Wo Contrast  Result Date: 05/08/2021 CLINICAL DATA:  Altered mental status. EXAM: CT HEAD WITHOUT CONTRAST TECHNIQUE: Contiguous axial images were obtained from the base of the skull through the vertex without intravenous contrast. COMPARISON:  January 08, 2021. FINDINGS: Brain: Mild diffuse cortical atrophy. Mild chronic ischemic white matter disease. No mass effect or midline shift is noted. Ventricular size is within normal limits. There is no evidence of mass lesion, hemorrhage or acute infarction. Vascular: No hyperdense vessel or unexpected calcification. Skull: Normal. Negative for fracture or focal lesion. Sinuses/Orbits: No acute finding. Other: None. IMPRESSION: No acute intracranial abnormality seen. Electronically Signed   By: Lupita Raider M.D.   On: 05/08/2021 15:50   DG Chest Port 1 View  Result Date: 05/08/2021 CLINICAL DATA:  Lethargy. EXAM: PORTABLE CHEST 1 VIEW COMPARISON:  Mar 21, 2021. FINDINGS: Stable cardiomediastinal silhouette. No pneumothorax or pleural effusion is noted. Stable minimal left basilar subsegmental atelectasis or scarring. Right lung is clear. Bony thorax is unremarkable. IMPRESSION: Stable minimal left basilar subsegmental atelectasis or scarring. Aortic Atherosclerosis (ICD10-I70.0). Electronically Signed   By: Lupita Raider M.D.   On: 05/08/2021 14:47    EKG: Independently reviewed.  Sinus rhythm, RBBB.  No significant change since prior  tracing.  Assessment/Plan Principal Problem:   UTI (urinary tract infection) Active Problems:   GERD   Sepsis (HCC)   Seizures (HCC)   AKI (acute kidney injury) (HCC)   Sepsis secondary to UTI Febrile and blood pressure soft on arrival.  Lactic acid normal.  UA with signs of infection. -Ceftriaxone.  Currently normotensive after 1 L fluid bolus, not tachycardic.  Continue gentle IV fluid hydration.  Add on urine culture.  Blood culture pending.  Acute metabolic encephalopathy Likely due to UTI and dehydration.  UA with signs of infection.  Has very dry mucous membranes on exam.  Head CT negative for acute finding.  No seizure-like activity reported. -Continue antibiotic and gentle IV fluid hydration.  Consider obtaining brain MRI and EEG if mental status does not improve.  Follow delirium precautions  Mild AKI on CKD stage II-IIIa Likely prerenal from dehydration. -IV fluid hydration and repeat BMP in a.m. Avoid nephrotoxic agents.  CAD status post PCI Mild troponin elevation Troponin mildly elevated but stable and EKG without acute ischemic changes.  Suspect mild troponin elevation is due to demand ischemia in the setting of sepsis. -Continue aspirin  Chronic anemia Hemoglobin not significantly changed compared to prior labs.  No signs of active bleeding. -Continue home iron supplement and monitor CBC  Mild thrombocytopenia Likely due to sepsis/infection. -Repeat CBC in a.m. Hold chemical DVT prophylaxis if platelet count drops below 100K or signs of active bleeding.  Seizure disorder Valproic acid level 44. -Pharmacist recommending continuing current dose of Depakote  Chronic diastolic heart failure No signs of volume overload. -Monitor volume status closely  Hypertension -Hold Coreg at this time given hypotension/sepsis  Diet controlled type 2 diabetes -CBG checks 3 times a day  Depression -Continue Celexa  GERD -Continue Protonix  DVT prophylaxis:  Lovenox Code Status: DNR (MOST form at bedside) and confirmed with the patient's daughter. Family Communication: Daughter at bedside. Disposition Plan: Status is: Inpatient  Remains inpatient appropriate because:Altered mental status and Inpatient level of care appropriate due to severity of illness  Dispo: The patient is from: SNF              Anticipated d/c is to: SNF              Patient currently is not medically stable to d/c.   Difficult to place patient No  Level of care: Level of care: Telemetry Medical  The medical decision making on this patient was of high complexity and the patient is at high risk for clinical deterioration, therefore this is a level 3 visit.  John Giovanni MD [facility]  If 7PM-7AM, please contact night-coverage [email or web address]  05/08/2021, 9:58 PM

## 2021-05-08 NOTE — ED Notes (Signed)
Urine sent down to lab WITH culture 

## 2021-05-08 NOTE — ED Provider Notes (Signed)
[facility] EMERGENCY DEPARTMENT Provider Note   CSN: [id number] Arrival date & time: 05/08/21  1345     History Chief Complaint  Patient presents with   Fatigue    [name] is a 87 y.o. female.  Patient is an 87 year old female with a history of CAD, thoracic aortic aneurysm, anemia, GERD, hypertension, hyperlipidemia, renal insufficiency, prior hepatic encephalopathy who is presenting from her skilled facility for change in mental status.  They noted yesterday at 6 PM she had decreased mentation and just seem more sleepy than usual.  Family was there trying to feed her today and patient was very lethargic.  They denied any vomiting, trouble breathing, coughing.  She had not complained of abdominal pain.  No recent medication changes.  The history is provided by the EMS personnel and a relative. The history is limited by the absence of a caregiver.      Past Medical History:  Diagnosis Date   Anemia    iron deficiency   Aneurysm, thoracic aortic (HCC)    Anxiety    B12 deficiency    CAD (coronary artery disease)    s/p stenting of LAD 1999- cath 5-08 EF normal LAD 30-40% restenosis. D1 50% D2 80% LCX & RCA minimal plaque   Chronic back pain    Constipation    Depression    GERD (gastroesophageal reflux disease)    Gout    HTN (hypertension)    Hyperlipemia    Obesity    Osteoporosis    Pancreatitis    Polyarthritis    DJD/ possible PMR   Renal insufficiency    Cr 1.2-1.3   Seizures (HCC)    Tinnitus    Urinary frequency    Vertigo    Vitamin D deficiency     Patient Active Problem List   Diagnosis Date Noted   Anemia 03/31/2021   Palpitations 01/08/2021   AMS (altered mental status) 01/08/2021   History of seizure 01/08/2021   S/P primary angioplasty with coronary stent 01/08/2021   Chronic diastolic CHF (congestive heart failure) (HCC) 01/08/2021   CKD (chronic kidney disease) stage 3, GFR 30-59 ml/min (HCC) 08/29/2020    Depression 08/29/2020   COVID-19 virus infection 02/05/2020   Unstable angina (HCC) 10/16/2019   Non-ST elevation (NSTEMI) myocardial infarction (HCC) 10/16/2019   Nausea 06/23/2018   CHF (congestive heart failure) (HCC) 04/03/2018   AKI (acute kidney injury) (HCC) 11/20/2017   Dehydration    Generalized weakness 11/18/2017   Second degree burn of foot 11/18/2017   Seizures (HCC) 07/17/2016   Hypertensive urgency 07/17/2016   Acute diastolic CHF (congestive heart failure) (HCC) 07/17/2016   UTI (urinary tract infection) 02/28/2016   Urinary incontinence 10/28/2015   Alopecia 08/12/2015   TIA (transient ischemic attack) 07/01/2015   Acute encephalopathy 06/24/2015   Protein-calorie malnutrition, severe (HCC) 04/19/2015   Fever    Abdominal abscess    Pressure ulcer 04/07/2015   Acute kidney injury (HCC)    Sepsis (HCC)    Stroke (HCC)    Facial droop    Confusion 03/26/2015   Malnutrition of moderate degree (HCC) 03/20/2015   Small bowel obstruction s/p exlap/LOA/decompression 03/25/2015 03/18/2015   Essential hypertension 03/18/2015   Noncompliance with medication regimen 12/07/2014   Fall against object 06/24/2014   Contusion of left hand 06/24/2014   Contusion of left hip 06/24/2014   Loss of weight 04/12/2014   Malignant hypertension with renal failure and congestive heart failure (HCC) 09/23/2013  Knee pain, bilateral 07/27/2013   Chronic fatigue disorder 01/19/2013   Vaginitis due to Candida 09/02/2012   Sinusitis 09/02/2012   Anemia in other chronic diseases classified elsewhere 09/02/2012   Polymyalgia rheumatica (HCC) 09/10/2011   Vertigo 08/21/2011   Fatigue 05/11/2011   ANEMIA OF CHRONIC DISEASE 09/06/2010   KNEE PAIN, CHRONIC 05/26/2010   DIZZINESS 05/11/2010   SHOULDER PAIN 04/25/2010   FREQUENCY, URINARY 03/16/2010   CONSTIPATION, CHRONIC 01/17/2010   FOOT PAIN 01/17/2009   Rash and other nonspecific skin eruption 01/17/2009   TINNITUS NOS 10/18/2008    B12 deficiency 08/10/2008   LOW BACK PAIN 06/23/2008   CHEST WALL PAIN 06/23/2008   Dysuria 03/18/2008   Abdominal pain 03/18/2008   Hyperlipidemia 12/24/2007   DYSPNEA 12/24/2007   Anxiety disorder 12/07/2007   GERD 12/07/2007   CHEST PAIN 12/07/2007   OTHER SPEC FORMS CHRONIC ISCHEMIC HEART DISEASE 12/01/2007   Type 2 diabetes mellitus with diabetic nephropathy, without long-term current use of insulin (HCC) 11/28/2007   Pain in joint 11/28/2007   Gout 09/17/2007   Adjustment disorder with mixed anxiety and depressed mood 09/17/2007   Coronary atherosclerosis 09/17/2007   Disorder resulting from impaired renal function 09/17/2007   OSTEOPOROSIS 09/17/2007   DVT, HX OF 09/17/2007   PANCREATITIS, HX OF 09/17/2007    Past Surgical History:  Procedure Laterality Date   ABDOMINAL HYSTERECTOMY     CARDIAC CATHETERIZATION  2008   L main 20%, LAD stent patent, D1 50%, D2 80% (small), RCA 20%, EF 55-60%   CHOLECYSTECTOMY     CORONARY ANGIOPLASTY WITH STENT PLACEMENT  1999   LAD stent   CORONARY STENT INTERVENTION N/A 10/16/2019   Procedure: CORONARY STENT INTERVENTION;  Surgeon: Marykay Lex, MD;  Location: [facility] INVASIVE CV LAB;  Service: Cardiovascular;  Laterality: N/A;   HEMORRHOID SURGERY     LAPAROSCOPIC LYSIS OF ADHESIONS N/A 03/24/2015   Procedure: LAPAROSCOPIC LYSIS OF ADHESIONS;  Surgeon: Luretha Murphy, MD;  Location: [facility] ORS;  Service: General;  Laterality: N/A;   LAPAROSCOPY N/A 03/24/2015   Procedure: LAPAROSCOPY DIAGNOSTIC;  Surgeon: Luretha Murphy, MD;  Location: [facility] ORS;  Service: General;  Laterality: N/A;   LAPAROTOMY N/A 03/24/2015   Procedure: LAPAROTOMY with decompression of bowel;  Surgeon: Luretha Murphy, MD;  Location: [facility] ORS;  Service: General;  Laterality: N/A;   LEFT HEART CATH AND CORONARY ANGIOGRAPHY N/A 10/16/2019   Procedure: LEFT HEART CATH AND CORONARY ANGIOGRAPHY;  Surgeon: Marykay Lex, MD;  Location[facility] INVASIVE CV LAB;  Service: Cardiovascular;   Laterality: N/A;   TUBAL LIGATION       OB History   No obstetric history on file.     Family History  Adopted: Yes  Problem Relation Age of Onset   Diabetes Mother    Hypertension Father    Cancer Sister     Social History   Tobacco Use   Smoking status: Never   Smokeless tobacco: Never  Substance Use Topics   Alcohol use: No   Drug use: No    Home Medications Prior to Admission medications   Medication Sig Start Date End Date Taking? Authorizing Provider  acetaminophen (TYLENOL) 325 MG tablet Take 2 tablets (650 mg total) by mouth every 6 (six) hours as needed for mild pain, headache or moderate pain. 01/14/21   Ghimire, Werner Lean, MD  aspirin EC 81 MG tablet Take 1 tablet (81 mg total) by mouth daily. Swallow whole. 10/27/20   Rollene Rotunda, MD  B Complex-C (  B-COMPLEX WITH VITAMIN C) tablet Take 1 tablet by mouth daily.    [provider]  Blood Glucose Calibration (ACCU-CHEK AVIVA) SOLN Use as directed Patient taking differently: 1 each by Other route See admin instructions. Use as directed per physician 02/20/17   Plotnikov, [name], MD  brimonidine (ALPHAGAN) 0.2 % ophthalmic solution Place 1 drop into both eyes 2 (two) times daily. 01/14/20   [provider]  carvedilol (COREG) 12.5 MG tablet Take 1 tablet (12.5 mg total) by mouth 2 (two) times daily with a meal. Patient taking differently: Take 25 mg by mouth. 1 tab po bid 01/16/21   Ghimire, Werner Lean, MD  carvedilol (COREG) 25 MG tablet Take 25 mg by mouth 2 (two) times daily with a meal.    [provider]  cholecalciferol (VITAMIN D) 25 MCG (1000 UNIT) tablet Take 1,000 Units by mouth daily.    [provider]  citalopram (CELEXA) 10 MG tablet Take 1 tablet (10 mg total) by mouth daily. 08/29/20 08/29/21  Corwin Levins, MD  diclofenac Sodium (VOLTAREN) 1 % GEL Apply 1 application topically 2 (two) times daily as needed (pain).    [provider]  divalproex (DEPAKOTE) 500  MG DR tablet Take 1 tablet (500 mg total) by mouth every 12 (twelve) hours. 01/14/21   Ghimire, Werner Lean, MD  feeding supplement, GLUCERNA SHAKE, (GLUCERNA SHAKE) LIQD Take 237 mLs by mouth 3 (three) times daily between meals. 02/09/20   Leroy Sea, MD  ferrous sulfate 325 (65 FE) MG tablet Take 1 tablet (325 mg total) by mouth daily. 03/21/21   Charlynne Pander, MD  glucose blood [facility] VERIO) test strip Use as instructed 08/27/17   Plotnikov, [name], MD  Menthol, Topical Analgesic, (BIOFREEZE EX) Apply 1 application topically 2 (two) times daily as needed (pain).    [provider]  methotrexate 2.5 MG tablet Take 10 mg by mouth once a week. For arthritis 10/20/20   [provider]  ondansetron (ZOFRAN) 4 MG tablet TAKE 1 TABLET BY MOUTH TWICE DAILY AS NEEDED FOR NAUSEA OR VOMITING 02/01/21   Plotnikov, [name], MD  ONETOUCH DELICA LANCETS FINE MISC 1 Device by Does not apply route daily as needed. Patient taking differently: 1 Device by Does not apply route daily as needed (blood glucose). 09/21/16   Plotnikov, [name], MD  pantoprazole (PROTONIX) 40 MG tablet Take 1 tablet (40 mg total) by mouth daily. 02/09/20   Leroy Sea, MD  rosuvastatin (CRESTOR) 10 MG tablet Take 10 mg by mouth daily.    [provider]  sodium phosphate (FLEET) 7-19 GM/118ML ENEM Place 133 mLs (1 enema total) rectally daily as needed for severe constipation. 03/21/21   Charlynne Pander, MD  thiamine 100 MG tablet Take 1 tablet (100 mg total) by mouth daily. 01/14/21   Ghimire, Werner Lean, MD    Allergies    Ativan [lorazepam], Tramadol hcl, Amlodipine besylate, Atenolol, Benazepril, Benicar [olmesartan medoxomil], Cozaar, Hydralazine, Hydrochlorothiazide w-triamterene, Hydrocodone, Hydroxyzine pamoate, Iodine, Lisinopril, Peach flavor, Penicillins, Pravastatin, Prednisolone, Strawberry flavor, Tramadol hcl, and Benadryl [diphenhydramine]  Review of Systems   Review of Systems   Unable to perform ROS: Mental status change   Physical Exam Updated Vital Signs BP 98/63 (BP Location: Right Arm)   Pulse 85   Temp (!) 100.7 F (38.2 C) (Rectal)   Resp 19   Ht  (1.651 m)   Wt 65.2 kg   SpO2 97%   BMI  23.92 kg/m   Physical Exam Vitals and nursing note reviewed.  Constitutional:      General: She is not in acute distress.    Appearance: She is well-developed.     Comments: Lethargic but patient will open her eyes to voice  HENT:     Head: Normocephalic and atraumatic.     Mouth/Throat:     Mouth: Mucous membranes are dry.  Eyes:     General:        Right eye: Discharge present.        Left eye: Discharge present.    Pupils: Pupils are equal, round, and reactive to light.  Cardiovascular:     Rate and Rhythm: Normal rate and regular rhythm.     Heart sounds: Murmur heard.    No friction rub.     Comments: Decreased effort Pulmonary:     Effort: Pulmonary effort is normal.     Breath sounds: Normal breath sounds. No wheezing or rales.  Abdominal:     General: Bowel sounds are normal. There is no distension.     Palpations: Abdomen is soft.     Tenderness: There is no abdominal tenderness. There is no guarding or rebound.  Musculoskeletal:        General: No tenderness. Normal range of motion.     Right lower leg: No edema.     Left lower leg: No edema.     Comments: No edema  Skin:    General: Skin is warm and dry.     Findings: No rash.  Neurological:     Cranial Nerves: No cranial nerve deficit.     Comments: Will open her eyes to voice but will not follow commands  Psychiatric:     Comments: Calm and cooperative    ED Results / Procedures / Treatments   Labs (all labs ordered are listed, but only abnormal results are displayed) Labs Reviewed  CBC WITH DIFFERENTIAL/PLATELET - Abnormal; Notable for the following components:      Result Value   RBC 2.45 (*)    Hemoglobin 8.1 (*)    HCT 25.8 (*)    MCV 105.3 (*)    RDW 17.6 (*)     Platelets 124 (*)    All other components within normal limits  CBG MONITORING, ED - Abnormal; Notable for the following components:   Glucose-Capillary 118 (*)    All other components within normal limits  CULTURE, BLOOD (SINGLE)  LACTIC ACID, PLASMA  COMPREHENSIVE METABOLIC PANEL  URINALYSIS, ROUTINE W REFLEX MICROSCOPIC  VALPROIC ACID LEVEL  TROPONIN I (HIGH SENSITIVITY)  TROPONIN I (HIGH SENSITIVITY)    EKG EKG Interpretation  Date/Time:  Monday May 08 2021 14:15:01 EDT Ventricular Rate:  86 PR Interval:  165 QRS Duration: 122 QT Interval:  408 QTC Calculation: 488 R Axis:   86 Text Interpretation: Sinus rhythm Right bundle branch block No significant change since last tracing Confirmed by Gwyneth Sprout ([id number]) on 05/08/2021 2:50:19 PM  Radiology DG Chest Port 1 View  Result Date: 05/08/2021 CLINICAL DATA:  Lethargy. EXAM: PORTABLE CHEST 1 VIEW COMPARISON:  Mar 21, 2021. FINDINGS: Stable cardiomediastinal silhouette. No pneumothorax or pleural effusion is noted. Stable minimal left basilar subsegmental atelectasis or scarring. Right lung is clear. Bony thorax is unremarkable. IMPRESSION: Stable minimal left basilar subsegmental atelectasis or scarring. Aortic Atherosclerosis (ICD10-I70.0). Electronically Signed   By: Lupita Raider M.D.   On: 05/08/2021 14:47    Procedures Procedures   Medications Ordered in  ED Medications - No data to display  ED Course  I have reviewed the triage vital signs and the nursing notes.  Pertinent labs & imaging results that were available during my care of the patient were reviewed by me and considered in my medical decision making (see chart for details).    MDM Rules/Calculators/A&P                          Elderly female presenting today with lethargy starting at 6 PM last night.  Patient will open her eyes to voice but does not follow commands.  She does live at a skilled facility and does have some cognitive deficits.   Patient comes with a MOST form and does indicates she would want IV fluids and medications but is a DNR.  Patient does take Depakote but no other mind altering medications.  We will check a Depakote level to ensure that it is normal.  Also after exam a rectal temperature was done and patient does have a temperature of 100.7.  Initial blood pressure was soft at 98/63.  After these findings were discovered a code sepsis was initiated.  Patient was given 1 L of fluid.  Broad-spectrum antibiotics.  Labs are pending. Final Clinical Impression(s) / ED Diagnoses Final diagnoses:  None    Rx / DC Orders ED Discharge Orders     None        Gwyneth Sprout, MD 05/08/21 1533

## 2021-05-08 NOTE — ED Notes (Signed)
Notified Lockwood MD about difficulty obtaining blood culture on pt

## 2021-05-08 NOTE — Progress Notes (Signed)
Pharmacy Antibiotic Note  [name] is a 87 y.o. female admitted on 05/08/2021 with sepsis.  Pharmacy has been consulted for vancomycin and cefepime dosing.  Plan: Vancomycin 1250mg  IV given once 6/27 at 1552 - further dosing to be determined by random level until renal function is stable Cefepime 2g every 24 hours Dosing/frequency to be adjusted per renal function Follow-up cultures and clinical course for opportunities to streamline antibiotic coverage  Height: 5\' 5"  (165.1 cm) Weight: 65.2 kg (143 lb 11.8 oz) IBW/kg (Calculated) : 57  Temp (24hrs), Avg:99.3 F (37.4 C), Min:97.9 F (36.6 C), Max:100.7 F (38.2 C)  Recent Labs  Lab 05/08/21 1431 05/08/21 1434  WBC  --  7.6  CREATININE  --  1.22*  LATICACIDVEN 1.0  --     Estimated Creatinine Clearance: 29.8 mL/min (A) (by C-G formula based on SCr of 1.22 mg/dL (H)).    Allergies  Allergen Reactions   Ativan [Lorazepam] Other (See Comments)    Very confused   Tramadol Hcl Anxiety and Rash    Headache   Amlodipine Besylate Other (See Comments)     dizzy   Atenolol Other (See Comments)    Fatigue   Benazepril Cough   Benicar [Olmesartan Medoxomil] Other (See Comments)    HEADACHE   Cozaar Nausea And Vomiting   Hydralazine Other (See Comments)    Hair loss   Hydrochlorothiazide W-Triamterene Other (See Comments)     dizzy   Hydrocodone Other (See Comments)    HEADACHE   Hydroxyzine Pamoate Other (See Comments)    Per MAR   Iodine Other (See Comments)    Per MAR   Lisinopril Other (See Comments) and Cough    Tired & fatigue   Peach Flavor Itching   Penicillins Itching    Has patient had a PCN reaction causing immediate rash, facial/tongue/throat swelling, SOB or lightheadedness with hypotension: No Has patient had a PCN reaction causing severe rash involving mucus membranes or skin necrosis: No Has patient had a PCN reaction that required hospitalization No Has patient had a PCN reaction occurring within  the last 10 years: Yes If all of the above answers are "NO", then may proceed with Cephalosporin use.  tolerates cephalosporins OK    Pravastatin Other (See Comments)    Myalgias-muscle pain   Prednisolone Nausea Only and Other (See Comments)    Upset stomach   Strawberry Flavor Itching   Tramadol Hcl Other (See Comments)    headache   Benadryl [Diphenhydramine] Itching and Palpitations   Antimicrobials this admission: Cefepime 6/27 >>  Vancomyicn 6/27 >>   Dose adjustments this admission: N/A  Microbiology results: 6/27 BCx: to be collected  Thank you for allowing pharmacy to be a part of this patient's care.  7/27, PharmD, BCPS 05/08/2021 3:17 PM ED Clinical Pharmacist -  [phone]

## 2021-05-08 NOTE — ED Triage Notes (Signed)
Pt arrived to ED via EMS from [facility] for AMS/lethargy LKW 1800 yesterday. Per staff pt at baseline, but more lethargic. Facility NP concerned for possible low hgb d/t pt presented w/ lethargy last time she had low hgb. VSS w/ EMS. 20g L hand placed. GCS 10 w/ EMS. EMS reports pt intermittently responsive to voice and pain. Pt has MOST form.

## 2021-05-08 NOTE — Sepsis Progress Note (Signed)
Sepsis protocol being followed by [facility] 

## 2021-05-08 NOTE — ED Notes (Signed)
Per family at bedside, pt is normally talkative and oriented.

## 2021-05-08 NOTE — ED Notes (Signed)
Attempted 2nd IV start x 2 unsuccessful.

## 2021-05-09 LAB — VITAMIN B12: Vitamin B-12: 763 pg/mL (ref 180–914)

## 2021-05-09 LAB — CBC
HCT: 24.3 % — ABNORMAL LOW (ref 36.0–46.0)
Hemoglobin: 7.6 g/dL — ABNORMAL LOW (ref 12.0–15.0)
MCH: 33.8 pg (ref 26.0–34.0)
MCHC: 31.3 g/dL (ref 30.0–36.0)
MCV: 108 fL — ABNORMAL HIGH (ref 80.0–100.0)
Platelets: DECREASED 10*3/uL (ref 150–400)
RBC: 2.25 MIL/uL — ABNORMAL LOW (ref 3.87–5.11)
RDW: 18.4 % — ABNORMAL HIGH (ref 11.5–15.5)
WBC: 7.7 10*3/uL (ref 4.0–10.5)
nRBC: 0 % (ref 0.0–0.2)

## 2021-05-09 LAB — BASIC METABOLIC PANEL
Anion gap: 13 (ref 5–15)
BUN: 24 mg/dL — ABNORMAL HIGH (ref 8–23)
CO2: 23 mmol/L (ref 22–32)
Calcium: 8.9 mg/dL (ref 8.9–10.3)
Chloride: 102 mmol/L (ref 98–111)
Creatinine, Ser: 0.96 mg/dL (ref 0.44–1.00)
GFR, Estimated: 58 mL/min — ABNORMAL LOW (ref >60)
Glucose, Bld: 76 mg/dL (ref 70–99)
Potassium: 3.7 mmol/L (ref 3.5–5.1)
Sodium: 138 mmol/L (ref 135–145)

## 2021-05-09 LAB — RETICULOCYTES
Immature Retic Fract: 6.1 % (ref 2.3–15.9)
RBC.: 2.13 MIL/uL — ABNORMAL LOW (ref 3.87–5.11)
Retic Count, Absolute: 11.7 10*3/uL — ABNORMAL LOW (ref 19.0–186.0)
Retic Ct Pct: 0.6 % (ref 0.4–3.1)

## 2021-05-09 LAB — IRON AND TIBC
Iron: 15 ug/dL — ABNORMAL LOW (ref 28–170)
Saturation Ratios: 11 % (ref 10.4–31.8)
TIBC: 137 ug/dL — ABNORMAL LOW (ref 250–450)
UIBC: 122 ug/dL

## 2021-05-09 LAB — PROCALCITONIN: Procalcitonin: 4.66 ng/mL

## 2021-05-09 LAB — SARS CORONAVIRUS 2 (TAT 6-24 HRS): SARS Coronavirus 2: NEGATIVE

## 2021-05-09 LAB — FERRITIN: Ferritin: 592 ng/mL — ABNORMAL HIGH (ref 11–307)

## 2021-05-09 LAB — FOLATE: Folate: 8.9 ng/mL (ref >5.9)

## 2021-05-09 MED ORDER — ENOXAPARIN SODIUM 40 MG/0.4ML IJ SOSY
40.0000 mg | PREFILLED_SYRINGE | INTRAMUSCULAR | Status: DC
  Administered 2021-05-09 – 2021-05-11 (×3): 40 mg via SUBCUTANEOUS
  Filled 2021-05-09 (×3): qty 0.4

## 2021-05-09 MED ORDER — CARVEDILOL 3.125 MG PO TABS
3.1250 mg | ORAL_TABLET | Freq: Two times a day (BID) | ORAL | Status: DC
  Administered 2021-05-09 – 2021-05-11 (×5): 3.125 mg via ORAL
  Filled 2021-05-09 (×5): qty 1

## 2021-05-09 MED ORDER — LACTATED RINGERS IV SOLN: INTRAVENOUS | Status: AC

## 2021-05-09 MED ORDER — ROSUVASTATIN CALCIUM 5 MG PO TABS
10.0000 mg | ORAL_TABLET | Freq: Every day | ORAL | Status: DC
  Administered 2021-05-09 – 2021-05-11 (×3): 10 mg via ORAL
  Filled 2021-05-09 (×3): qty 2

## 2021-05-09 MED ORDER — THIAMINE HCL 100 MG PO TABS
100.0000 mg | ORAL_TABLET | Freq: Every day | ORAL | Status: DC
  Administered 2021-05-09 – 2021-05-11 (×3): 100 mg via ORAL
  Filled 2021-05-09 (×3): qty 1

## 2021-05-09 MED ORDER — GLUCERNA SHAKE PO LIQD
237.0000 mL | Freq: Three times a day (TID) | ORAL | Status: DC
  Administered 2021-05-09 – 2021-05-11 (×8): 237 mL via ORAL

## 2021-05-09 NOTE — Evaluation (Signed)
Physical Therapy Evaluation Patient Details Name: [name] MRN: [id number] DOB: Jun 21, 1933 Today's Date: 05/09/2021   History of Present Illness  Pt adm 6/27 with lethargy and found to have sepsis secondary to UTI. PMH - CAD s/p stent x2, chronic diastolic heart failure, htn, TIA, dm,, history of seizure, CKD stage III, RA, depression  Clinical Impression  Pt presents to PT with dependencies in all mobility. Will work with pt to maximize mobility to decr burden of care. Recommend return to facility at prior level of care.     Follow Up Recommendations SNF (return to prior level of care)    Equipment Recommendations  None recommended by PT    Recommendations for Other Services       Precautions / Restrictions Precautions Precautions: Fall      Mobility  Bed Mobility Overal bed mobility: Needs Assistance Bed Mobility: Supine to Sit;Sit to Supine     Supine to sit: Total assist;HOB elevated Sit to supine: Total assist   General bed mobility comments: Assist with all aspects    Transfers                 General transfer comment: Unable to safely attempt with 1 person  Ambulation/Gait                Stairs            Wheelchair Mobility    Modified Rankin (Stroke Patients Only)       Balance Overall balance assessment: Needs assistance Sitting-balance support: No upper extremity supported;Feet supported Sitting balance-Leahy Scale: Fair                                       Pertinent Vitals/Pain Pain Assessment: Faces Faces Pain Scale: Hurts even more Pain Location: BLE's with movement Pain Descriptors / Indicators: Grimacing;Guarding;Moaning Pain Intervention(s): Monitored during session;Repositioned    Home Living Family/patient expects to be discharged to:: Skilled nursing facility                 Additional Comments: [name].    Prior Function Level of Independence: Needs assistance   Gait /  Transfers Assistance Needed: Use a w/c primarily. Staff transfering her to w/c  ADL's / Homemaking Assistance Needed: Assist with ADLs; can feed herself but takes a long time  Comments: Was using rollator for mobility prior to feb and then "was sick" per daughter     Hand Dominance   Dominant Hand: Right    Extremity/Trunk Assessment   Upper Extremity Assessment Upper Extremity Assessment: Defer to OT evaluation    Lower Extremity Assessment Lower Extremity Assessment: RLE deficits/detail;LLE deficits/detail RLE Deficits / Details: Strength <3/5 and knee flexion limited to ~70 degrees LLE Deficits / Details: Strength <3/5 and knee flexion limited to ~70 degrees       Communication   Communication: HOH  Cognition Arousal/Alertness: Lethargic;Awake/alert Behavior During Therapy: Flat affect Overall Cognitive Status: No family/caregiver present to determine baseline cognitive functioning Area of Impairment: Orientation;Attention;Memory;Following commands;Safety/judgement;Problem solving                 Orientation Level: Disoriented to;Place;Time;Situation Current Attention Level: Sustained Memory: Decreased short-term memory Following Commands: Follows one step commands inconsistently;Follows one step commands with increased time Safety/Judgement: Decreased awareness of safety;Decreased awareness of deficits   Problem Solving: Slow processing;Decreased initiation;Difficulty sequencing;Requires verbal cues;Requires tactile cues General Comments: Pt initially lethargic but  aroused with moblity and verbal stimuli      General Comments      Exercises     Assessment/Plan    PT Assessment Patient needs continued PT services  PT Problem List Decreased strength;Decreased range of motion;Decreased activity tolerance;Decreased balance;Decreased mobility;Decreased cognition;Pain       PT Treatment Interventions DME instruction;Functional mobility training;Therapeutic  activities;Therapeutic exercise;Balance training;Patient/family education    PT Goals (Current goals can be found in the Care Plan section)  Acute Rehab PT Goals Patient Stated Goal: Did not state PT Goal Formulation: Patient unable to participate in goal setting Time For Goal Achievement: 05/23/21 Potential to Achieve Goals: Fair    Frequency Min 2X/week   Barriers to discharge        Co-evaluation               AM-PAC PT "6 Clicks" Mobility  Outcome Measure Help needed turning from your back to your side while in a flat bed without using bedrails?: Total Help needed moving from lying on your back to sitting on the side of a flat bed without using bedrails?: Total Help needed moving to and from a bed to a chair (including a wheelchair)?: Total Help needed standing up from a chair using your arms (e.g., wheelchair or bedside chair)?: Total Help needed to walk in hospital room?: Total Help needed climbing 3-5 steps with a railing? : Total 6 Click Score: 6    End of Session   Activity Tolerance: Patient limited by pain Patient left: in bed;with call bell/phone within reach Nurse Communication: Other (comment) (asked secretary to inform techs that pt's bed was wet and needed to be changed. Purewick container was not sealed when I entered room) PT Visit Diagnosis: Other abnormalities of gait and mobility (R26.89);Difficulty in walking, not elsewhere classified (R26.2);Pain Pain - Right/Left:  (bilateral) Pain - part of body: Knee;Leg    Time: 1030-1048 PT Time Calculation (min) (ACUTE ONLY): 18 min   Charges:   PT Evaluation $PT Eval Moderate Complexity: 1 Mod          [facility] PT Acute Rehabilitation Services Pager [phone] Office [phone]   Angelina Ok [facility] 05/09/2021, 12:39 PM

## 2021-05-09 NOTE — Evaluation (Signed)
Occupational Therapy Evaluation Patient Details Name: [name] MRN: [id number] DOB: Dec 09, 1932 Today's Date: 05/09/2021    History of Present Illness 87 yo female presenting on 6/27 with lethargy and found to have sepsis secondary to UTI. PMH including CAD s/p stent x2, chronic diastolic heart failure, HTN, TIA, dm,, history of seizure, CKD stage III, RA, and depression   Clinical Impression   PTA, pt was living at [facility] and was requiring assistance for ADLs and transfer to and from w/c. Pt currently requiring Max-Total A for ADLs, bed mobility, and functional transfers. Pt presenting with decreased balance, strength, and activity tolerance. Additionally, pt was soiled with urine upon arrival - transitioned to recliner and assisting to change gown and sheets. Pt would benefit from further acute OT to facilitate safe dc. Recommend dc to SNF for further OT to optimize safety, independence with ADLs, and return to PLOF.     Follow Up Recommendations  SNF    Equipment Recommendations  None recommended by OT    Recommendations for Other Services PT consult     Precautions / Restrictions Precautions Precautions: Fall      Mobility Bed Mobility Overal bed mobility: Needs Assistance Bed Mobility: Supine to Sit     Supine to sit: Max assist;+2 for physical assistance Sit to supine: Total assist   General bed mobility comments: Assistance to bring BLEs towards EOB. pt then holding therapists hand to attempting pulling trunk up. Assist to eleavte trunk and transition hips    Transfers Overall transfer level: Needs assistance   Transfers: Sit to/from Stand;Stand Pivot Transfers Sit to Stand: Max assist;+2 physical assistance Stand pivot transfers: Max assist;+2 physical assistance       General transfer comment: Max A to power up and weight shift forward and then second person to transition hips to recliner    Balance Overall balance assessment: Needs  assistance Sitting-balance support: No upper extremity supported;Feet supported Sitting balance-Leahy Scale: Fair     Standing balance support: No upper extremity supported;During functional activity Standing balance-Leahy Scale: Zero                             ADL either performed or assessed with clinical judgement   ADL Overall ADL's : Needs assistance/impaired Eating/Feeding: Maximal assistance;With caregiver independent assisting;Bed level Eating/Feeding Details (indicate cue type and reason): While in bed, pt's daughter assisting her to eat as she is fatigued. Daughter wanting to optimize as much intake as possible     Upper Body Bathing: Maximal assistance;Sitting   Lower Body Bathing: Total assistance;+2 for physical assistance;Sit to/from stand   Upper Body Dressing : Maximal assistance;Sitting   Lower Body Dressing: Total assistance;+2 for physical assistance;Sit to/from stand   Toilet Transfer: Maximal assistance;+2 for physical assistance;Stand-pivot (simulated to recliner) Toilet Transfer Details (indicate cue type and reason): Max A to power up and weight shift forward and then second person to transition hips to recliner         Functional mobility during ADLs: Maximal assistance;+2 for physical assistance (stand pivot only) General ADL Comments: Pt requiring Max-Total A for ADLs     Vision         Perception     Praxis      Pertinent Vitals/Pain Pain Assessment: Faces Faces Pain Scale: Hurts even more Pain Location: BLE's with movement Pain Descriptors / Indicators: Grimacing;Guarding;Moaning Pain Intervention(s): Monitored during session;Limited activity within patient's tolerance;Repositioned     Hand Dominance  Right   Extremity/Trunk Assessment Upper Extremity Assessment Upper Extremity Assessment: Generalized weakness   Lower Extremity Assessment Lower Extremity Assessment: Defer to PT evaluation RLE Deficits / Details:  Strength <3/5 and knee flexion limited to ~70 degrees LLE Deficits / Details: Strength <3/5 and knee flexion limited to ~70 degrees   Cervical / Trunk Assessment Cervical / Trunk Assessment: Other exceptions Cervical / Trunk Exceptions: Increased body habitus   Communication Communication Communication: HOH   Cognition Arousal/Alertness: Lethargic;Awake/alert Behavior During Therapy: Flat affect Overall Cognitive Status: Impaired/Different from baseline Area of Impairment: Orientation;Attention;Memory;Following commands;Safety/judgement;Problem solving;Awareness                 Orientation Level: Disoriented to;Place;Time;Situation Current Attention Level: Sustained Memory: Decreased short-term memory Following Commands: Follows one step commands inconsistently;Follows one step commands with increased time Safety/Judgement: Decreased awareness of safety;Decreased awareness of deficits   Problem Solving: Slow processing;Decreased initiation;Difficulty sequencing;Requires verbal cues;Requires tactile cues General Comments: Pt initially lethargic but aroused with moblity and verbal stimuli   General Comments  VSS on RA    Exercises     Shoulder Instructions      Home Living Family/patient expects to be discharged to:: Skilled nursing facility                                 Additional Comments: [name].      Prior Functioning/Environment Level of Independence: Needs assistance  Gait / Transfers Assistance Needed: Use a w/c primarily. Staff transfering her to w/c ADL's / Homemaking Assistance Needed: Assist with ADLs; can feed herself but takes a long time   Comments: Was using rollator for mobility prior to feb and then "was sick" per daughter        OT Problem List: Decreased range of motion;Decreased strength;Decreased activity tolerance;Impaired balance (sitting and/or standing);Decreased safety awareness;Decreased knowledge of use of DME or  AE;Decreased knowledge of precautions;Obesity      OT Treatment/Interventions: Self-care/ADL training;Therapeutic exercise;Energy conservation;DME and/or AE instruction;Therapeutic activities;Patient/family education    OT Goals(Current goals can be found in the care plan section) Acute Rehab OT Goals Patient Stated Goal: Get stronger - daughter OT Goal Formulation: With family Time For Goal Achievement: 05/23/21 Potential to Achieve Goals: Good  OT Frequency: Min 2X/week   Barriers to D/C:            Co-evaluation              AM-PAC OT "6 Clicks" Daily Activity     Outcome Measure Help from another person eating meals?: A Lot Help from another person taking care of personal grooming?: A Lot Help from another person toileting, which includes using toliet, bedpan, or urinal?: Total Help from another person bathing (including washing, rinsing, drying)?: A Lot Help from another person to put on and taking off regular upper body clothing?: A Lot Help from another person to put on and taking off regular lower body clothing?: Total 6 Click Score: 10   End of Session Equipment Utilized During Treatment: Gait belt Nurse Communication: Mobility status  Activity Tolerance: Patient limited by fatigue Patient left: in chair;with call bell/phone within reach;with family/visitor present  OT Visit Diagnosis: Unsteadiness on feet (R26.81);Other abnormalities of gait and mobility (R26.89);Muscle weakness (generalized) (M62.81)                Time: 4081-4481 OT Time Calculation (min): 38 min Charges:  OT General Charges $OT Visit: 1 Visit OT Evaluation $  OT Eval Moderate Complexity: 1 Mod OT Treatments $Self Care/Home Management : 23-37 mins  Jerrad Mendibles MSOT, OTR/L Acute Rehab Pager: [phone] Office: [phone]  Theodoro Grist Ayvion Kavanagh 05/09/2021, 1:02 PM

## 2021-05-09 NOTE — Progress Notes (Signed)
PROGRESS NOTE                                                                                                                                                                                                             Patient Demographics:    [name], is a 87 y.o. female, DOB - [birth year], WJX:[id number]  Outpatient Primary MD for the patient is Plotnikov, [name], MD    LOS - 1  Admit date - 05/08/2021    Chief Complaint  Patient presents with   Fatigue   Code Sepsis       Brief Narrative (HPI from H&P)  -  [name] is a 87 y.o. female with medical history significant of CAD status post PCI, chronic diastolic heart failure, TIA, hypertension, hyperlipidemia, diet-controlled type 2 diabetes, CKD stage II-IIIa, rheumatoid arthritis, depression, seizure disorder, GERD presented to the ED via EMS from [facility] for evaluation of altered mental status/lethargy, her work-up suggested toxic encephalopathy with sepsis due to UTI and she was admitted to the hospital.   Subjective:    [name] today has, No headache, No chest pain, No abdominal pain - No Nausea, No new weakness tingling or numbness, no SOB.   Assessment  & Plan :      Acute toxic encephalopathy in a patient with sepsis due to UTI - CT head negative with no focal deficits, no clinical suspicion for seizures, with supportive care which included empiric IV antibiotic, IV fluids her mentation is improving.  Remains at risk for delirium.  Continue to monitor cultures and clinically.  2.  AKI .  Resolved after IV fluids  3.  Anemia of chronic disease.  Lower due to hemodilution from IV fluids, obtain anemia panel, have taken consent from patient's daughter transfuse if she drops below 7.  Type screen done.  No signs of active bleeding  4.  HX of seizure disorder.  On Depakote continue.  5.  History of CAD.  No chest pain, EKG nonacute, mild  elevation in troponin due to AKI and demand from sepsis, continue aspirin, statin and Coreg for secondary prevention.  No further work-up not a candidate for invasive tests or intervention.  6.  Mild thrombocytopenia.  Due to sepsis monitor.  7.  Chronic diastolic CHF.  Currently compensated, recent EF 60%, continue Coreg, increase  dose if needed.  8.  Hypertension.  On Coreg, dose can be increased if needed as blood pressure improves   Obesity: Estimated body mass index is 23.04 kg/m as calculated from the following:   Height as of this encounter: 5\' 5"  (1.651 m).   Weight as of this encounter: 62.8 kg.          Condition - Extremely Guarded  Family Communication  : Daughter [phone] over the phone on 05/09/2021  Code Status :  DNR  Consults  :  None  PUD Prophylaxis : PPI   Procedures  :     CT head  - non acute      Disposition Plan  :    Status is: Inpatient  Remains inpatient appropriate because:IV treatments appropriate due to intensity of illness or inability to take PO  Dispo: The patient is from: SNF              Anticipated d/c is to: SNF              Patient currently is not medically stable to d/c.   Difficult to place patient No  DVT Prophylaxis  :    enoxaparin (LOVENOX) injection 30 mg Start: 05/08/21 2200  Lab Results  Component Value Date   PLT  05/09/2021    PLATELET CLUMPS NOTED ON SMEAR, COUNT APPEARS DECREASED    Diet :  Diet Order             Diet heart healthy/carb modified Room service appropriate? No; Fluid consistency: Thin  Diet effective now                    Inpatient Medications  Scheduled Meds:  aspirin EC  81 mg Oral Daily   B-complex with vitamin C  1 tablet Oral Daily   cholecalciferol  1,000 Units Oral Daily   citalopram  10 mg Oral Daily   divalproex  500 mg Oral Q12H   enoxaparin (LOVENOX) injection  30 mg Subcutaneous Q24H   ferrous sulfate  325 mg Oral Daily   pantoprazole  40 mg Oral Daily    senna-docusate  2 tablet Oral BID   Continuous Infusions:  cefTRIAXone (ROCEPHIN)  IV     PRN Meds:.acetaminophen **OR** acetaminophen  Antibiotics  :    Anti-infectives (From admission, onward)    Start     Dose/Rate Route Frequency Ordered Stop   05/09/21 1600  ceFEPIme (MAXIPIME) 2 g in sodium chloride 0.9 % 100 mL IVPB  Status:  Discontinued        2 g 200 mL/hr over 30 Minutes Intravenous Every 24 hours 05/08/21 1612 05/08/21 2149   05/09/21 1600  cefTRIAXone (ROCEPHIN) 1 g in sodium chloride 0.9 % 100 mL IVPB        1 g 200 mL/hr over 30 Minutes Intravenous Every 24 hours 05/08/21 2149     05/08/21 1845  vancomycin variable dose per unstable renal function (pharmacist dosing)  Status:  Discontinued         Does not apply See admin instructions 05/08/21 1845 05/08/21 2149   05/08/21 1530  vancomycin (VANCOREADY) IVPB 1250 mg/250 mL        1,250 mg 166.7 mL/hr over 90 Minutes Intravenous  Once 05/08/21 1453 05/08/21 1722   05/08/21 1500  ceFEPIme (MAXIPIME) 2 g in sodium chloride 0.9 % 100 mL IVPB        2 g 200 mL/hr over 30 Minutes Intravenous  Once  05/08/21 1453 05/08/21 1656   05/08/21 1500  metroNIDAZOLE (FLAGYL) IVPB 500 mg        500 mg 100 mL/hr over 60 Minutes Intravenous  Once 05/08/21 1453 05/08/21 1652        Time Spent in minutes  30   Susa Raring M.D on 05/09/2021 at 12:11 PM  To page go to [email or web address]   [facility] -  Office  [phone]   See all Orders from today for further details    Objective:   Vitals:   05/08/21 2328 05/09/21 0500 05/09/21 0733 05/09/21 1141  BP: (!) 136/49 (!) 154/57 (!) 137/53 (!) 151/53  Pulse: 68 72 63 65  Resp: 14 13 14 16   Temp: 97.9 F (36.6 C) 98.2 F (36.8 C) 97.8 F (36.6 C) 98.1 F (36.7 C)  TempSrc: Oral Axillary Axillary Oral  SpO2: 96% 100% 99% 99%  Weight: 62.8 kg     Height:        Wt Readings from Last 3 Encounters:  05/08/21 62.8 kg  03/31/21 65.2 kg  02/06/21 66.2 kg      Intake/Output Summary (Last 24 hours) at 05/09/2021 1211 Last data filed at 05/09/2021 0300 Gross per 24 hour  Intake 1548.98 ml  Output --  Net 1548.98 ml     Physical Exam  Awake,  No new F.N deficits,   [place].AT,PERRAL Supple Neck,No JVD, No cervical lymphadenopathy appriciated.  Symmetrical Chest wall movement, Good air movement bilaterally, CTAB RRR,No Gallops,Rubs or new Murmurs, No Parasternal Heave +ve B.Sounds, Abd Soft, No tenderness, No organomegaly appriciated, No rebound - guarding or rigidity. No Cyanosis, Clubbing or edema, No new Rash or bruise     RN pressure injury documentation:     Data Review:    CBC Recent Labs  Lab 05/08/21 1434 05/09/21 0028  WBC 7.6 7.7  HGB 8.1* 7.6*  HCT 25.8* 24.3*  PLT 124* PLATELET CLUMPS NOTED ON SMEAR, COUNT APPEARS DECREASED  MCV 105.3* 108.0*  MCH 33.1 33.8  MCHC 31.4 31.3  RDW 17.6* 18.4*  LYMPHSABS 0.9  --   MONOABS 0.2  --   EOSABS 0.0  --   BASOSABS 0.0  --     Recent Labs  Lab 05/08/21 1431 05/08/21 1434 05/09/21 0457 05/09/21 0737  NA  --  138 138  --   K  --  4.1 3.7  --   CL  --  105 102  --   CO2  --  23 23  --   GLUCOSE  --  128* 76  --   BUN  --  24* 24*  --   CREATININE  --  1.22* 0.96  --   CALCIUM  --  8.8* 8.9  --   AST  --  24  --   --   ALT  --  7  --   --   ALKPHOS  --  48  --   --   BILITOT  --  0.9  --   --   ALBUMIN  --  2.4*  --   --   PROCALCITON  --   --   --  4.66  LATICACIDVEN 1.0  --   --   --     ------------------------------------------------------------------------------------------------------------------ No results for input(s): CHOL, HDL, LDLCALC, TRIG, CHOLHDL, LDLDIRECT in the last 72 hours.  Lab Results  Component Value Date   HGBA1C 6.2 (H) 01/12/2021   ------------------------------------------------------------------------------------------------------------------ No results for input(s): TSH, T4TOTAL, T3FREE, THYROIDAB in the last  72  hours.  Invalid input(s): FREET3  Cardiac Enzymes No results for input(s): CKMB, TROPONINI, MYOGLOBIN in the last 168 hours.  Invalid input(s): CK ------------------------------------------------------------------------------------------------------------------    Component Value Date/Time   BNP 304.9 (H) 02/10/2020 0404    Micro Results Recent Results (from the past 240 hour(s))  SARS CORONAVIRUS 2 (TAT 6-24 HRS) Nasopharyngeal Nasopharyngeal Swab     Status: None   Collection Time: 05/08/21  8:55 PM   Specimen: Nasopharyngeal Swab  Result Value Ref Range Status   SARS Coronavirus 2 NEGATIVE NEGATIVE Final    Comment: (NOTE) SARS-CoV-2 target nucleic acids are NOT DETECTED.  The SARS-CoV-2 RNA is generally detectable in upper and lower respiratory specimens during the acute phase of infection. Negative results do not preclude SARS-CoV-2 infection, do not rule out co-infections with other pathogens, and should not be used as the sole basis for treatment or other patient management decisions. Negative results must be combined with clinical observations, patient history, and epidemiological information. The expected result is Negative.  Fact Sheet for Patients: HairSlick.no  Fact Sheet for Healthcare Providers: quierodirigir.com  This test is not yet approved or cleared by the [place] FDA and  has been authorized for detection and/or diagnosis of SARS-CoV-2 by FDA under an Emergency Use Authorization (EUA). This EUA will remain  in effect (meaning this test can be used) for the duration of the COVID-19 declaration under Se ction 564(b)(1) of the Act, 21 U.S.C. section 360bbb-3(b)(1), unless the authorization is terminated or revoked sooner.  Performed at [facility], [address] [place], [place] [address]     Radiology Reports CT Head Wo Contrast  Result Date: 05/08/2021 CLINICAL DATA:  Altered  mental status. EXAM: CT HEAD WITHOUT CONTRAST TECHNIQUE: Contiguous axial images were obtained from the base of the skull through the vertex without intravenous contrast. COMPARISON:  January 08, 2021. FINDINGS: Brain: Mild diffuse cortical atrophy. Mild chronic ischemic white matter disease. No mass effect or midline shift is noted. Ventricular size is within normal limits. There is no evidence of mass lesion, hemorrhage or acute infarction. Vascular: No hyperdense vessel or unexpected calcification. Skull: Normal. Negative for fracture or focal lesion. Sinuses/Orbits: No acute finding. Other: None. IMPRESSION: No acute intracranial abnormality seen. Electronically Signed   By: Lupita Raider M.D.   On: 05/08/2021 15:50   DG Chest Port 1 View  Result Date: 05/08/2021 CLINICAL DATA:  Lethargy. EXAM: PORTABLE CHEST 1 VIEW COMPARISON:  Mar 21, 2021. FINDINGS: Stable cardiomediastinal silhouette. No pneumothorax or pleural effusion is noted. Stable minimal left basilar subsegmental atelectasis or scarring. Right lung is clear. Bony thorax is unremarkable. IMPRESSION: Stable minimal left basilar subsegmental atelectasis or scarring. Aortic Atherosclerosis (ICD10-I70.0). Electronically Signed   By: Lupita Raider M.D.   On: 05/08/2021 14:47   LONG TERM MONITOR (3-14 DAYS)  Result Date: 04/19/2021  Patch Wear Time: 5 days and 7 hours (2022-02-22T18:14:37-0500 to 2022-02-28T01:34:09-499) Patient had a min HR of 49 bpm, max HR of 137 bpm, and avg HR of 70 bpm. Predominant underlying rhythm was Sinus Rhythm. Bundle Branch Block/IVCD was present. Isolated SVEs were rare (<1.0%), SVE Couplets were rare (<1.0%), and SVE Triplets were rare (<1.0%). Isolated VEs were rare (<1.0%), VE Couplets were rare (<1.0%), and no VE Triplets were present.  Normal sinus rhythm Rare atrial and ventricular ectopy with an isolated ventricular couplet.

## 2021-05-10 ENCOUNTER — Ambulatory Visit: Payer: Medicare HMO | Admitting: Neurology

## 2021-05-10 DIAGNOSIS — N179 Acute kidney failure, unspecified: Secondary | ICD-10-CM

## 2021-05-10 LAB — COMPREHENSIVE METABOLIC PANEL
ALT: 9 U/L (ref 0–44)
AST: 29 U/L (ref 15–41)
Albumin: 2 g/dL — ABNORMAL LOW (ref 3.5–5.0)
Alkaline Phosphatase: 43 U/L (ref 38–126)
Anion gap: 10 (ref 5–15)
BUN: 20 mg/dL (ref 8–23)
CO2: 22 mmol/L (ref 22–32)
Calcium: 8.5 mg/dL — ABNORMAL LOW (ref 8.9–10.3)
Chloride: 106 mmol/L (ref 98–111)
Creatinine, Ser: 0.98 mg/dL (ref 0.44–1.00)
GFR, Estimated: 56 mL/min — ABNORMAL LOW (ref >60)
Glucose, Bld: 107 mg/dL — ABNORMAL HIGH (ref 70–99)
Potassium: 3.8 mmol/L (ref 3.5–5.1)
Sodium: 138 mmol/L (ref 135–145)
Total Bilirubin: 0.3 mg/dL (ref 0.3–1.2)
Total Protein: 5 g/dL — ABNORMAL LOW (ref 6.5–8.1)

## 2021-05-10 LAB — CBC WITH DIFFERENTIAL/PLATELET
Abs Immature Granulocytes: 0.06 10*3/uL (ref 0.00–0.07)
Basophils Absolute: 0 10*3/uL (ref 0.0–0.1)
Basophils Relative: 0 %
Eosinophils Absolute: 0.3 10*3/uL (ref 0.0–0.5)
Eosinophils Relative: 4 %
HCT: 23.1 % — ABNORMAL LOW (ref 36.0–46.0)
Hemoglobin: 7.3 g/dL — ABNORMAL LOW (ref 12.0–15.0)
Immature Granulocytes: 1 %
Lymphocytes Relative: 11 %
Lymphs Abs: 0.8 10*3/uL (ref 0.7–4.0)
MCH: 33.8 pg (ref 26.0–34.0)
MCHC: 31.6 g/dL (ref 30.0–36.0)
MCV: 106.9 fL — ABNORMAL HIGH (ref 80.0–100.0)
Monocytes Absolute: 0.7 10*3/uL (ref 0.1–1.0)
Monocytes Relative: 10 %
Neutro Abs: 5.2 10*3/uL (ref 1.7–7.7)
Neutrophils Relative %: 74 %
Platelets: 104 10*3/uL — ABNORMAL LOW (ref 150–400)
RBC: 2.16 MIL/uL — ABNORMAL LOW (ref 3.87–5.11)
RDW: 17.5 % — ABNORMAL HIGH (ref 11.5–15.5)
WBC: 7 10*3/uL (ref 4.0–10.5)
nRBC: 0 % (ref 0.0–0.2)

## 2021-05-10 LAB — URINE CULTURE

## 2021-05-10 LAB — BRAIN NATRIURETIC PEPTIDE: B Natriuretic Peptide: 257.9 pg/mL — ABNORMAL HIGH (ref 0.0–100.0)

## 2021-05-10 LAB — GLUCOSE, CAPILLARY
Glucose-Capillary: 51 mg/dL — ABNORMAL LOW (ref 70–99)
Glucose-Capillary: 62 mg/dL — ABNORMAL LOW (ref 70–99)
Glucose-Capillary: 93 mg/dL (ref 70–99)
Glucose-Capillary: 87 mg/dL (ref 70–99)
Glucose-Capillary: 115 mg/dL — ABNORMAL HIGH (ref 70–99)

## 2021-05-10 LAB — MAGNESIUM: Magnesium: 1.5 mg/dL — ABNORMAL LOW (ref 1.7–2.4)

## 2021-05-10 MED ORDER — MAGNESIUM SULFATE 2 GM/50ML IV SOLN
2.0000 g | Freq: Once | INTRAVENOUS | Status: AC
  Administered 2021-05-10: 2 g via INTRAVENOUS
  Filled 2021-05-10: qty 50

## 2021-05-10 MED ORDER — DEXTROSE 50 % IV SOLN
INTRAVENOUS | Status: AC
  Administered 2021-05-10: 50 mL
  Filled 2021-05-10: qty 50

## 2021-05-10 NOTE — Progress Notes (Signed)
PROGRESS NOTE                                                                                                                                                                                                             Patient Demographics:    [name], is a 87 y.o. female, DOB - 1932/11/25, ZOX:[id number]  Outpatient Primary MD for the patient is Plotnikov, [name], MD    LOS - 2  Admit date - 05/08/2021    Chief Complaint  Patient presents with   Fatigue   Code Sepsis       Brief Narrative (HPI from H&P)  -  [name] is a 87 y.o. female with medical history significant of CAD status post PCI, chronic diastolic heart failure, TIA, hypertension, hyperlipidemia, diet-controlled type 2 diabetes, CKD stage II-IIIa, rheumatoid arthritis, depression, seizure disorder, GERD presented to the ED via EMS from [facility] for evaluation of altered mental status/lethargy, her work-up suggested toxic encephalopathy with sepsis due to UTI and she was admitted to the hospital.   Subjective:    [name] today has, No headache, No chest pain, No abdominal pain - No Nausea, does report generalized weakness and fatigue, she reports appetite is improving.    Assessment  & Plan :      Acute toxic encephalopathy in a patient with sepsis due to UTI - CT head negative with no focal deficits, no clinical suspicion for seizures, with supportive care which included empiric IV antibiotic, IV fluids her mentation is improving.  Remains at risk for delirium.  Continue with Rocephin, urine cultures unhelpful as growing multiple species.  2.  AKI .  Resolved after IV fluids  3.  Anemia of chronic disease.  Lower due to hemodilution from IV fluids, anemia panel significant for elevated ferritin and low TIBC which is significant for anemia of chronic disease, transfuse for hemoglobin less than 7.    4.  HX of seizure disorder.  On  Depakote continue.  5.  History of CAD.  No chest pain, EKG nonacute, mild elevation in troponin due to AKI and demand from sepsis, continue aspirin, statin and Coreg for secondary prevention.  No further work-up not a candidate for invasive tests or intervention.  6.  Mild thrombocytopenia.  Due to sepsis monitor.  7.  Chronic diastolic CHF.  Currently compensated, recent EF 60%, continue Coreg, increase dose if needed.  8.  Hypertension.  On Coreg, dose can be increased if needed as blood pressure improves   Obesity: Estimated body mass index is 23.04 kg/m as calculated from the following:   Height as of this encounter:  (1.651 m).   Weight as of this encounter: 62.8 kg.          Condition - Extremely Guarded  Family Communication  : None at bedside  Code Status :  DNR  Consults  :  None  PUD Prophylaxis : PPI   Procedures  :     CT head  - non acute      Disposition Plan  :    Status is: Inpatient  Remains inpatient appropriate because:IV treatments appropriate due to intensity of illness or inability to take PO  Dispo: The patient is from: SNF              Anticipated d/c is to: SNF              Patient currently is not medically stable to d/c.   Difficult to place patient No  DVT Prophylaxis  :    enoxaparin (LOVENOX) injection 40 mg Start: 05/09/21 2200  Lab Results  Component Value Date   PLT 104 (L) 05/10/2021    Diet :  Diet Order             DIET - DYS 1 Room service appropriate? No; Fluid consistency: Thin  Diet effective now                    Inpatient Medications  Scheduled Meds:  aspirin EC  81 mg Oral Daily   B-complex with vitamin C  1 tablet Oral Daily   carvedilol  3.125 mg Oral BID WC   cholecalciferol  1,000 Units Oral Daily   citalopram  10 mg Oral Daily   divalproex  500 mg Oral Q12H   enoxaparin (LOVENOX) injection  40 mg Subcutaneous Q24H   feeding supplement (GLUCERNA SHAKE)  237 mL Oral TID BM   ferrous  sulfate  325 mg Oral Daily   pantoprazole  40 mg Oral Daily   rosuvastatin  10 mg Oral Daily   senna-docusate  2 tablet Oral BID   thiamine  100 mg Oral Daily   Continuous Infusions:  cefTRIAXone (ROCEPHIN)  IV 1 g (05/09/21 1606)   PRN Meds:.acetaminophen **OR** acetaminophen  Antibiotics  :    Anti-infectives (From admission, onward)    Start     Dose/Rate Route Frequency Ordered Stop   05/09/21 1600  ceFEPIme (MAXIPIME) 2 g in sodium chloride 0.9 % 100 mL IVPB  Status:  Discontinued        2 g 200 mL/hr over 30 Minutes Intravenous Every 24 hours 05/08/21 1612 05/08/21 2149   05/09/21 1600  cefTRIAXone (ROCEPHIN) 1 g in sodium chloride 0.9 % 100 mL IVPB        1 g 200 mL/hr over 30 Minutes Intravenous Every 24 hours 05/08/21 2149     05/08/21 1845  vancomycin variable dose per unstable renal function (pharmacist dosing)  Status:  Discontinued         Does not apply See admin instructions 05/08/21 1845 05/08/21 2149   05/08/21 1530  vancomycin (VANCOREADY) IVPB 1250 mg/250 mL        1,250 mg 166.7 mL/hr over 90 Minutes Intravenous  Once 05/08/21 1453 05/08/21 1722  05/08/21 1500  ceFEPIme (MAXIPIME) 2 g in sodium chloride 0.9 % 100 mL IVPB        2 g 200 mL/hr over 30 Minutes Intravenous  Once 05/08/21 1453 05/08/21 1656   05/08/21 1500  metroNIDAZOLE (FLAGYL) IVPB 500 mg        500 mg 100 mL/hr over 60 Minutes Intravenous  Once 05/08/21 1453 05/08/21 1652        Jamichael Knotts M.D on 05/10/2021 at 1:54 PM  To page go to [email or web address]   [facility] -  Office  [phone]   See all Orders from today for further details    Objective:   Vitals:   05/09/21 1956 05/10/21 0010 05/10/21 0809 05/10/21 1146  BP: (!) 158/52 (!) 122/42 (!) 166/56 (!) 158/48  Pulse: 70 63 64 66  Resp: 15 16 15 20   Temp: 97.9 F (36.6 C) 97.8 F (36.6 C) (!) 97.3 F (36.3 C) 97.6 F (36.4 C)  TempSrc: Axillary Axillary  Axillary  SpO2: 98% 100%  99%  Weight:      Height:         Wt Readings from Last 3 Encounters:  05/08/21 62.8 kg  03/31/21 65.2 kg  02/06/21 66.2 kg     Intake/Output Summary (Last 24 hours) at 05/10/2021 1354 Last data filed at 05/10/2021 0900 Gross per 24 hour  Intake 1642.51 ml  Output 200 ml  Net 1442.51 ml     Physical Exam  Awake Alert, extremely frail, deconditioned  symmetrical Chest wall movement, Good air movement bilaterally, CTAB RRR,No Gallops,Rubs or new Murmurs, No Parasternal Heave +ve B.Sounds, Abd Soft, No tenderness, No rebound - guarding or rigidity. No Cyanosis, Clubbing or edema, No new Rash or bruise     RN pressure injury documentation:     Data Review:    CBC Recent Labs  Lab 05/08/21 1434 05/09/21 0028 05/10/21 0111  WBC 7.6 7.7 7.0  HGB 8.1* 7.6* 7.3*  HCT 25.8* 24.3* 23.1*  PLT 124* PLATELET CLUMPS NOTED ON SMEAR, COUNT APPEARS DECREASED 104*  MCV 105.3* 108.0* 106.9*  MCH 33.1 33.8 33.8  MCHC 31.4 31.3 31.6  RDW 17.6* 18.4* 17.5*  LYMPHSABS 0.9  --  0.8  MONOABS 0.2  --  0.7  EOSABS 0.0  --  0.3  BASOSABS 0.0  --  0.0    Recent Labs  Lab 05/08/21 1431 05/08/21 1434 05/09/21 0457 05/09/21 0737 05/10/21 0111  NA  --  138 138  --  138  K  --  4.1 3.7  --  3.8  CL  --  105 102  --  106  CO2  --  23 23  --  22  GLUCOSE  --  128* 76  --  107*  BUN  --  24* 24*  --  20  CREATININE  --  1.22* 0.96  --  0.98  CALCIUM  --  8.8* 8.9  --  8.5*  AST  --  24  --   --  29  ALT  --  7  --   --  9  ALKPHOS  --  48  --   --  43  BILITOT  --  0.9  --   --  0.3  ALBUMIN  --  2.4*  --   --  2.0*  MG  --   --   --   --  1.5*  PROCALCITON  --   --   --  4.66  --  LATICACIDVEN 1.0  --   --   --   --   BNP  --   --   --   --  257.9*    ------------------------------------------------------------------------------------------------------------------ No results for input(s): CHOL, HDL, LDLCALC, TRIG, CHOLHDL, LDLDIRECT in the last 72 hours.  Lab Results  Component Value Date   HGBA1C  6.2 (H) 01/12/2021   ------------------------------------------------------------------------------------------------------------------ No results for input(s): TSH, T4TOTAL, T3FREE, THYROIDAB in the last 72 hours.  Invalid input(s): FREET3  Cardiac Enzymes No results for input(s): CKMB, TROPONINI, MYOGLOBIN in the last 168 hours.  Invalid input(s): CK ------------------------------------------------------------------------------------------------------------------    Component Value Date/Time   BNP 257.9 (H) 05/10/2021 0111    Micro Results Recent Results (from the past 240 hour(s))  SARS CORONAVIRUS 2 (TAT 6-24 HRS) Nasopharyngeal Nasopharyngeal Swab     Status: None   Collection Time: 05/08/21  8:55 PM   Specimen: Nasopharyngeal Swab  Result Value Ref Range Status   SARS Coronavirus 2 NEGATIVE NEGATIVE Final    Comment: (NOTE) SARS-CoV-2 target nucleic acids are NOT DETECTED.  The SARS-CoV-2 RNA is generally detectable in upper and lower respiratory specimens during the acute phase of infection. Negative results do not preclude SARS-CoV-2 infection, do not rule out co-infections with other pathogens, and should not be used as the sole basis for treatment or other patient management decisions. Negative results must be combined with clinical observations, patient history, and epidemiological information. The expected result is Negative.  Fact Sheet for Patients: HairSlick.no  Fact Sheet for Healthcare Providers: quierodirigir.com  This test is not yet approved or cleared by the [place] FDA and  has been authorized for detection and/or diagnosis of SARS-CoV-2 by FDA under an Emergency Use Authorization (EUA). This EUA will remain  in effect (meaning this test can be used) for the duration of the COVID-19 declaration under Se ction 564(b)(1) of the Act, 21 U.S.C. section 360bbb-3(b)(1), unless the authorization  is terminated or revoked sooner.  Performed at [facility], [address], [place], [place] [address]   Culture, Urine     Status: Abnormal   Collection Time: 05/08/21  9:48 PM   Specimen: Urine, Random  Result Value Ref Range Status   Specimen Description URINE, RANDOM  Final   Special Requests   Final    NONE Performed at [facility], [address], [place], [place] [address]    Culture MULTIPLE SPECIES PRESENT, SUGGEST RECOLLECTION (A)  Final   Report Status 05/10/2021 FINAL  Final  Culture, blood (single)     Status: None (Preliminary result)   Collection Time: 05/09/21 12:28 AM   Specimen: BLOOD LEFT HAND  Result Value Ref Range Status   Specimen Description BLOOD LEFT HAND  Final   Special Requests   Final    BOTTLES DRAWN AEROBIC ONLY Blood Culture adequate volume   Culture   Final    NO GROWTH 1 DAY Performed at [facility], [address] [place], [place] [address]    Report Status PENDING  Incomplete    Radiology Reports CT Head Wo Contrast  Result Date: 05/08/2021 CLINICAL DATA:  Altered mental status. EXAM: CT HEAD WITHOUT CONTRAST TECHNIQUE: Contiguous axial images were obtained from the base of the skull through the vertex without intravenous contrast. COMPARISON:  January 08, 2021. FINDINGS: Brain: Mild diffuse cortical atrophy. Mild chronic ischemic white matter disease. No mass effect or midline shift is noted. Ventricular size is within normal limits. There is no evidence  of mass lesion, hemorrhage or acute infarction. Vascular: No hyperdense vessel or unexpected calcification. Skull: Normal. Negative for fracture or focal lesion. Sinuses/Orbits: No acute finding. Other: None. IMPRESSION: No acute intracranial abnormality seen. Electronically Signed   By: Lupita Raider M.D.   On: 05/08/2021 15:50   DG Chest Port 1 View  Result Date: 05/08/2021 CLINICAL DATA:  Lethargy. EXAM: PORTABLE CHEST 1 VIEW COMPARISON:  Mar 21, 2021. FINDINGS:  Stable cardiomediastinal silhouette. No pneumothorax or pleural effusion is noted. Stable minimal left basilar subsegmental atelectasis or scarring. Right lung is clear. Bony thorax is unremarkable. IMPRESSION: Stable minimal left basilar subsegmental atelectasis or scarring. Aortic Atherosclerosis (ICD10-I70.0). Electronically Signed   By: Lupita Raider M.D.   On: 05/08/2021 14:47   LONG TERM MONITOR (3-14 DAYS)  Result Date: 04/19/2021  Patch Wear Time: 5 days and 7 hours (2022-02-22T18:14:37-0500 to 2022-02-28T01:34:09-499) Patient had a min HR of 49 bpm, max HR of 137 bpm, and avg HR of 70 bpm. Predominant underlying rhythm was Sinus Rhythm. Bundle Branch Block/IVCD was present. Isolated SVEs were rare (<1.0%), SVE Couplets were rare (<1.0%), and SVE Triplets were rare (<1.0%). Isolated VEs were rare (<1.0%), VE Couplets were rare (<1.0%), and no VE Triplets were present.  Normal sinus rhythm Rare atrial and ventricular ectopy with an isolated ventricular couplet.

## 2021-05-11 DIAGNOSIS — R569 Unspecified convulsions: Secondary | ICD-10-CM

## 2021-05-11 LAB — COMPREHENSIVE METABOLIC PANEL
ALT: 9 U/L (ref 0–44)
AST: 25 U/L (ref 15–41)
Albumin: 2.2 g/dL — ABNORMAL LOW (ref 3.5–5.0)
Alkaline Phosphatase: 42 U/L (ref 38–126)
Anion gap: 9 (ref 5–15)
BUN: 19 mg/dL (ref 8–23)
CO2: 21 mmol/L — ABNORMAL LOW (ref 22–32)
Calcium: 8.6 mg/dL — ABNORMAL LOW (ref 8.9–10.3)
Chloride: 107 mmol/L (ref 98–111)
Creatinine, Ser: 0.71 mg/dL (ref 0.44–1.00)
GFR, Estimated: >60 mL/min (ref >60)
Glucose, Bld: 101 mg/dL — ABNORMAL HIGH (ref 70–99)
Potassium: 3.8 mmol/L (ref 3.5–5.1)
Sodium: 137 mmol/L (ref 135–145)
Total Bilirubin: 0.4 mg/dL (ref 0.3–1.2)
Total Protein: 6.1 g/dL — ABNORMAL LOW (ref 6.5–8.1)

## 2021-05-11 LAB — CBC WITH DIFFERENTIAL/PLATELET
Abs Immature Granulocytes: 0.03 10*3/uL (ref 0.00–0.07)
Basophils Absolute: 0 10*3/uL (ref 0.0–0.1)
Basophils Relative: 0 %
Eosinophils Absolute: 0.4 10*3/uL (ref 0.0–0.5)
Eosinophils Relative: 7 %
HCT: 22.7 % — ABNORMAL LOW (ref 36.0–46.0)
Hemoglobin: 7.3 g/dL — ABNORMAL LOW (ref 12.0–15.0)
Immature Granulocytes: 1 %
Lymphocytes Relative: 18 %
Lymphs Abs: 1 10*3/uL (ref 0.7–4.0)
MCH: 33.6 pg (ref 26.0–34.0)
MCHC: 32.2 g/dL (ref 30.0–36.0)
MCV: 104.6 fL — ABNORMAL HIGH (ref 80.0–100.0)
Monocytes Absolute: 0.6 10*3/uL (ref 0.1–1.0)
Monocytes Relative: 10 %
Neutro Abs: 3.6 10*3/uL (ref 1.7–7.7)
Neutrophils Relative %: 64 %
Platelets: 116 10*3/uL — ABNORMAL LOW (ref 150–400)
RBC: 2.17 MIL/uL — ABNORMAL LOW (ref 3.87–5.11)
RDW: 17.2 % — ABNORMAL HIGH (ref 11.5–15.5)
WBC: 5.6 10*3/uL (ref 4.0–10.5)
nRBC: 0.7 % — ABNORMAL HIGH (ref 0.0–0.2)

## 2021-05-11 LAB — MAGNESIUM: Magnesium: 1.8 mg/dL (ref 1.7–2.4)

## 2021-05-11 LAB — GLUCOSE, CAPILLARY
Glucose-Capillary: 54 mg/dL — ABNORMAL LOW (ref 70–99)
Glucose-Capillary: 78 mg/dL (ref 70–99)
Glucose-Capillary: 54 mg/dL — ABNORMAL LOW (ref 70–99)
Glucose-Capillary: 70 mg/dL (ref 70–99)
Glucose-Capillary: 75 mg/dL (ref 70–99)

## 2021-05-11 LAB — BRAIN NATRIURETIC PEPTIDE: B Natriuretic Peptide: 583.5 pg/mL — ABNORMAL HIGH (ref 0.0–100.0)

## 2021-05-11 LAB — RESP PANEL BY RT-PCR (FLU A&B, COVID) ARPGX2
Influenza A by PCR: NEGATIVE
Influenza B by PCR: NEGATIVE
SARS Coronavirus 2 by RT PCR: NEGATIVE

## 2021-05-11 LAB — PROCALCITONIN: Procalcitonin: 2.35 ng/mL

## 2021-05-11 MED ORDER — CARVEDILOL 3.125 MG PO TABS
3.1250 mg | ORAL_TABLET | Freq: Two times a day (BID) | ORAL | Status: AC
Start: 2021-05-11 — End: ?

## 2021-05-11 MED ORDER — DIVALPROEX SODIUM 125 MG PO CSDR: 500.0000 mg | DELAYED_RELEASE_CAPSULE | Freq: Two times a day (BID) | ORAL | 1 refills | Status: DC

## 2021-05-11 MED ORDER — DIVALPROEX SODIUM 125 MG PO CSDR
500.0000 mg | DELAYED_RELEASE_CAPSULE | Freq: Two times a day (BID) | ORAL | Status: DC
  Administered 2021-05-11: 500 mg via ORAL
  Filled 2021-05-11: qty 4

## 2021-05-11 NOTE — Care Management Important Message (Signed)
Important Message  Patient Details  Name: [name] MRN: [id number] Date of Birth: 1933-08-17   Medicare Important Message Given:  Yes - Important Message mailed due to current National Emergency   Verbal consent obtained due to current National Emergency  Relationship to patient: Self Contact Name: Doxie Call Date: 05/11/21  Time: 1510 Phone: .[phone] Outcome: No Answer/Busy Important Message mailed to: Patient address on file   Orson Aloe 05/11/2021, 3:10 PM

## 2021-05-11 NOTE — TOC Initial Note (Signed)
Transition of Care [facility]) - Initial/Assessment Note    Patient Details  Name: [name] MRN: [id number] Date of Birth: 1933/01/07  Transition of Care [facility]) CM/SW Contact:    Mearl Latin, LCSW Phone Number: 05/11/2021, 11:16 AM  Clinical Narrative:                 CSW spoke with patient's daughter [name], and confirmed plan to return to [place] today. [facility] able to accept patient pending rapid covid test.   Expected Discharge Plan: Skilled Nursing Facility Barriers to Discharge: No Barriers Identified   Patient Goals and CMS Choice Patient states their goals for this hospitalization and ongoing recovery are:: return to snf CMS Medicare.gov Compare Post Acute Care list provided to:: Patient Represenative (must comment) Choice offered to / list presented to : Adult Children  Expected Discharge Plan and Services Expected Discharge Plan: Skilled Nursing Facility In-house Referral: Clinical Social Work   Post Acute Care Choice: Skilled Nursing Facility Living arrangements for the past 2 months: Skilled Nursing Facility Expected Discharge Date: 05/11/21                                    Prior Living Arrangements/Services Living arrangements for the past 2 months: Skilled Nursing Facility Lives with:: Facility Resident Patient language and need for interpreter reviewed:: Yes Do you feel safe going back to the place where you live?: Yes      Need for Family Participation in Patient Care: Yes (Comment) Care giver support system in place?: Yes (comment)   Criminal Activity/Legal Involvement Pertinent to Current Situation/Hospitalization: No - Comment as needed  Activities of Daily Living Home Assistive Devices/Equipment: Wheelchair ADL Screening (condition at time of admission) Patient's cognitive ability adequate to safely complete daily activities?: No Is the patient deaf or have difficulty hearing?: No Does the patient have difficulty seeing, even when wearing  glasses/contacts?: No Does the patient have difficulty concentrating, remembering, or making decisions?: Yes Patient able to express need for assistance with ADLs?: Yes Does the patient have difficulty dressing or bathing?: Yes Independently performs ADLs?: No Communication: Appropriate for developmental age Dressing (OT): Dependent Is this a change from baseline?: Pre-admission baseline Grooming: Dependent Is this a change from baseline?: Pre-admission baseline Feeding: Dependent Is this a change from baseline?: Pre-admission baseline Bathing: Dependent Is this a change from baseline?: Pre-admission baseline Toileting: Dependent Is this a change from baseline?: Pre-admission baseline In/Out Bed: Dependent Is this a change from baseline?: Pre-admission baseline Walks in Home: Dependent Is this a change from baseline?: Pre-admission baseline Does the patient have difficulty walking or climbing stairs?: Yes Weakness of Legs: Right Weakness of Arms/Hands: Left  Permission Sought/Granted Permission sought to share information with : Facility Medical sales representative, Family Supports Permission granted to share information with : No  Share Information with NAME: [name]  Permission granted to share info w AGENCY: [facility]  Permission granted to share info w Relationship: Daugher  Permission granted to share info w Contact Information: [phone]  Emotional Assessment Appearance:: Appears stated age Attitude/Demeanor/Rapport: Unable to Assess Affect (typically observed): Unable to Assess Orientation: : Oriented to Self, Oriented to Place Alcohol / Substance Use: Not Applicable Psych Involvement: No (comment)  Admission diagnosis:  UTI (urinary tract infection) [N39.0] Patient Active Problem List   Diagnosis Date Noted   Anemia 03/31/2021   Palpitations 01/08/2021   AMS (altered mental status) 01/08/2021   History of seizure 01/08/2021  S/P primary angioplasty with coronary stent  01/08/2021   Chronic diastolic CHF (congestive heart failure) (HCC) 01/08/2021   CKD (chronic kidney disease) stage 3, GFR 30-59 ml/min (HCC) 08/29/2020   Depression 08/29/2020   COVID-19 virus infection 02/05/2020   Unstable angina (HCC) 10/16/2019   Non-ST elevation (NSTEMI) myocardial infarction (HCC) 10/16/2019   Nausea 06/23/2018   CHF (congestive heart failure) (HCC) 04/03/2018   AKI (acute kidney injury) (HCC) 11/20/2017   Dehydration    Generalized weakness 11/18/2017   Second degree burn of foot 11/18/2017   Seizures (HCC) 07/17/2016   Hypertensive urgency 07/17/2016   Acute diastolic CHF (congestive heart failure) (HCC) 07/17/2016   UTI (urinary tract infection) 02/28/2016   Urinary incontinence 10/28/2015   Alopecia 08/12/2015   TIA (transient ischemic attack) 07/01/2015   Acute encephalopathy 06/24/2015   Protein-calorie malnutrition, severe (HCC) 04/19/2015   Fever    Abdominal abscess    Pressure ulcer 04/07/2015   Acute kidney injury (HCC)    Sepsis (HCC)    Stroke (HCC)    Facial droop    Confusion 03/26/2015   Malnutrition of moderate degree (HCC) 03/20/2015   Small bowel obstruction s/p exlap/LOA/decompression 03/25/2015 03/18/2015   Essential hypertension 03/18/2015   Noncompliance with medication regimen 12/07/2014   Fall against object 06/24/2014   Contusion of left hand 06/24/2014   Contusion of left hip 06/24/2014   Loss of weight 04/12/2014   Malignant hypertension with renal failure and congestive heart failure (HCC) 09/23/2013   Knee pain, bilateral 07/27/2013   Chronic fatigue disorder 01/19/2013   Vaginitis due to Candida 09/02/2012   Sinusitis 09/02/2012   Anemia in other chronic diseases classified elsewhere 09/02/2012   Polymyalgia rheumatica (HCC) 09/10/2011   Vertigo 08/21/2011   Fatigue 05/11/2011   ANEMIA OF CHRONIC DISEASE 09/06/2010   KNEE PAIN, CHRONIC 05/26/2010   DIZZINESS 05/11/2010   SHOULDER PAIN 04/25/2010   FREQUENCY,  URINARY 03/16/2010   CONSTIPATION, CHRONIC 01/17/2010   FOOT PAIN 01/17/2009   Rash and other nonspecific skin eruption 01/17/2009   TINNITUS NOS 10/18/2008   B12 deficiency 08/10/2008   LOW BACK PAIN 06/23/2008   CHEST WALL PAIN 06/23/2008   Dysuria 03/18/2008   Abdominal pain 03/18/2008   Hyperlipidemia 12/24/2007   DYSPNEA 12/24/2007   Anxiety disorder 12/07/2007   GERD 12/07/2007   CHEST PAIN 12/07/2007   OTHER SPEC FORMS CHRONIC ISCHEMIC HEART DISEASE 12/01/2007   Type 2 diabetes mellitus with diabetic nephropathy, without long-term current use of insulin (HCC) 11/28/2007   Pain in joint 11/28/2007   Gout 09/17/2007   Adjustment disorder with mixed anxiety and depressed mood 09/17/2007   Coronary atherosclerosis 09/17/2007   Disorder resulting from impaired renal function 09/17/2007   OSTEOPOROSIS 09/17/2007   DVT, HX OF 09/17/2007   PANCREATITIS, HX OF 09/17/2007   PCP:  Tresa Garter, MD Pharmacy:   [facility] (Now [facility]) - [place], [place] - [address] [place] [address] Phone: [phone] Fax: [phone]  [facility] [address] [place], [place] - [address] [place] [address] Phone: [phone] Fax: [phone]     Social Determinants of Health (SDOH) Interventions    Readmission Risk Interventions Readmission Risk Prevention Plan 02/08/2020  Transportation Screening Complete  Medication Review (RN Care Manager) Complete  PCP or Specialist appointment within 3-5 days of discharge Complete  HRI or Home Care Consult Complete  SW Recovery Care/Counseling Consult Complete  Palliative Care Screening Not Applicable  Skilled Nursing Facility Complete  Some recent data might be hidden

## 2021-05-11 NOTE — [place] FL2 (Signed)
[place] MEDICAID FL2 LEVEL OF CARE SCREENING TOOL     IDENTIFICATION  Patient Name: [name] Birthdate: December 05, 1932 Sex: female Admission Date (Current Location): 05/08/2021  [facility] and [place] Number:  Producer, television/film/video and Address:  [facility], [address] [place], [place] [address]      Provider Number: [id number]  Attending Physician Name and Address:  Starleen Arms, MD  Relative Name and Phone Number:  [name], daughter, [phone]    Current Level of Care: Hospital Recommended Level of Care: Skilled Nursing Facility Prior Approval Number:    Date Approved/Denied:   PASRR Number: [id number]  Discharge Plan: SNF    Current Diagnoses: Patient Active Problem List   Diagnosis Date Noted   Anemia 03/31/2021   Palpitations 01/08/2021   AMS (altered mental status) 01/08/2021   History of seizure 01/08/2021   S/P primary angioplasty with coronary stent 01/08/2021   Chronic diastolic CHF (congestive heart failure) (HCC) 01/08/2021   CKD (chronic kidney disease) stage 3, GFR 30-59 ml/min (HCC) 08/29/2020   Depression 08/29/2020   COVID-19 virus infection 02/05/2020   Unstable angina (HCC) 10/16/2019   Non-ST elevation (NSTEMI) myocardial infarction (HCC) 10/16/2019   Nausea 06/23/2018   CHF (congestive heart failure) (HCC) 04/03/2018   AKI (acute kidney injury) (HCC) 11/20/2017   Dehydration    Generalized weakness 11/18/2017   Second degree burn of foot 11/18/2017   Seizures (HCC) 07/17/2016   Hypertensive urgency 07/17/2016   Acute diastolic CHF (congestive heart failure) (HCC) 07/17/2016   UTI (urinary tract infection) 02/28/2016   Urinary incontinence 10/28/2015   Alopecia 08/12/2015   TIA (transient ischemic attack) 07/01/2015   Acute encephalopathy 06/24/2015   Protein-calorie malnutrition, severe (HCC) 04/19/2015   Fever    Abdominal abscess    Pressure ulcer 04/07/2015   Acute kidney injury (HCC)     Sepsis (HCC)    Stroke (HCC)    Facial droop    Confusion 03/26/2015   Malnutrition of moderate degree (HCC) 03/20/2015   Small bowel obstruction s/p exlap/LOA/decompression 03/25/2015 03/18/2015   Essential hypertension 03/18/2015   Noncompliance with medication regimen 12/07/2014   Fall against object 06/24/2014   Contusion of left hand 06/24/2014   Contusion of left hip 06/24/2014   Loss of weight 04/12/2014   Malignant hypertension with renal failure and congestive heart failure (HCC) 09/23/2013   Knee pain, bilateral 07/27/2013   Chronic fatigue disorder 01/19/2013   Vaginitis due to Candida 09/02/2012   Sinusitis 09/02/2012   Anemia in other chronic diseases classified elsewhere 09/02/2012   Polymyalgia rheumatica (HCC) 09/10/2011   Vertigo 08/21/2011   Fatigue 05/11/2011   ANEMIA OF CHRONIC DISEASE 09/06/2010   KNEE PAIN, CHRONIC 05/26/2010   DIZZINESS 05/11/2010   SHOULDER PAIN 04/25/2010   FREQUENCY, URINARY 03/16/2010   CONSTIPATION, CHRONIC 01/17/2010   FOOT PAIN 01/17/2009   Rash and other nonspecific skin eruption 01/17/2009   TINNITUS NOS 10/18/2008   B12 deficiency 08/10/2008   LOW BACK PAIN 06/23/2008   CHEST WALL PAIN 06/23/2008   Dysuria 03/18/2008   Abdominal pain 03/18/2008   Hyperlipidemia 12/24/2007   DYSPNEA 12/24/2007   Anxiety disorder 12/07/2007   GERD 12/07/2007   CHEST PAIN 12/07/2007   OTHER SPEC FORMS CHRONIC ISCHEMIC HEART DISEASE 12/01/2007   Type 2 diabetes mellitus with diabetic nephropathy, without long-term current use of insulin (HCC) 11/28/2007   Pain in joint 11/28/2007   Gout 09/17/2007   Adjustment disorder with mixed anxiety and  depressed mood 09/17/2007   Coronary atherosclerosis 09/17/2007   Disorder resulting from impaired renal function 09/17/2007   OSTEOPOROSIS 09/17/2007   DVT, HX OF 09/17/2007   PANCREATITIS, HX OF 09/17/2007    Orientation RESPIRATION BLADDER Height & Weight     Self, Place  Normal Incontinent,  External catheter Weight: 138 lb 7.2 oz (62.8 kg) Height:   (165.1 cm)  BEHAVIORAL SYMPTOMS/MOOD NEUROLOGICAL BOWEL NUTRITION STATUS      Continent Diet (Please see dc summary)  AMBULATORY STATUS COMMUNICATION OF NEEDS Skin   Extensive Assist Verbally Normal                       Personal Care Assistance Level of Assistance  Bathing, Feeding, Dressing Bathing Assistance: Maximum assistance Feeding assistance: Limited assistance Dressing Assistance: Limited assistance     Functional Limitations Info  Sight Sight Info: Impaired        SPECIAL CARE FACTORS FREQUENCY                       Contractures Contractures Info: Not present    Additional Factors Info  Code Status, Allergies Code Status Info: DNR Allergies Info: Ativan (Lorazepam), Tramadol Hcl, Amlodipine Besylate, Atenolol, Benazepril, Benicar (Olmesartan Medoxomil), Cozaar, Hydralazine, Hydrochlorothiazide, Hydrochlorothiazide W-triamterene, Hydrocodone, Hydroxyzine Pamoate, Iodine, Lisinopril, Peach Flavor, Penicillins, Pravastatin, Prednisolone, Strawberry Flavor, Tramadol Hcl, Benadryl (Diphenhydramine)           Current Medications (05/11/2021):  This is the current hospital active medication list Current Facility-Administered Medications  Medication Dose Route Frequency Provider Last Rate Last Admin   acetaminophen (TYLENOL) tablet 650 mg  650 mg Oral Q6H PRN John Giovanni, MD   650 mg at 05/10/21 2146   Or   acetaminophen (TYLENOL) suppository 650 mg  650 mg Rectal Q6H PRN John Giovanni, MD       aspirin EC tablet 81 mg  81 mg Oral Daily John Giovanni, MD   81 mg at 05/11/21 0915   B-complex with vitamin C tablet 1 tablet  1 tablet Oral Daily John Giovanni, MD   1 tablet at 05/11/21 0915   carvedilol (COREG) tablet 3.125 mg  3.125 mg Oral BID WC Susa Raring K, MD   3.125 mg at 05/11/21 0916   cefTRIAXone (ROCEPHIN) 1 g in sodium chloride 0.9 % 100 mL IVPB  1 g  Intravenous Q24H John Giovanni, MD 200 mL/hr at 05/10/21 1634 1 g at 05/10/21 1634   cholecalciferol (VITAMIN D3) tablet 1,000 Units  1,000 Units Oral Daily John Giovanni, MD   1,000 Units at 05/11/21 0916   citalopram (CELEXA) tablet 10 mg  10 mg Oral Daily John Giovanni, MD   10 mg at 05/11/21 0916   divalproex (DEPAKOTE) DR tablet 500 mg  500 mg Oral Q12H John Giovanni, MD   500 mg at 05/11/21 0915   enoxaparin (LOVENOX) injection 40 mg  40 mg Subcutaneous Q24H Pham, Minh Q, RPH-CPP   40 mg at 05/10/21 2146   feeding supplement (GLUCERNA SHAKE) (GLUCERNA SHAKE) liquid 237 mL  237 mL Oral TID BM Leroy Sea, MD   237 mL at 05/11/21 0915   ferrous sulfate tablet 325 mg  325 mg Oral Daily John Giovanni, MD   325 mg at 05/11/21 0916   pantoprazole (PROTONIX) EC tablet 40 mg  40 mg Oral Daily John Giovanni, MD   40 mg at 05/11/21 0915   rosuvastatin (CRESTOR) tablet 10 mg  10 mg Oral Daily  Leroy Sea, MD   10 mg at 05/11/21 6438   senna-docusate (Senokot-S) tablet 2 tablet  2 tablet Oral BID John Giovanni, MD   2 tablet at 05/11/21 0915   thiamine tablet 100 mg  100 mg Oral Daily Leroy Sea, MD   100 mg at 05/11/21 0915     Discharge Medications: Please see discharge summary for a list of discharge medications.  Relevant Imaging Results:  Relevant Lab Results:   Additional Information SSN: [id number]  Mearl Latin, LCSW

## 2021-05-11 NOTE — Consult Note (Signed)
   [facility] Inpatient Consult   05/11/2021  [name] 06-28-1933 [id number]  [facility] [[facility]]  Accountable Care Organization [ACO] Patient: [facility] Medicare  Patient screened for hospitalization with noted high risk score for unplanned readmission risk and to assess for potential restart of [facility] [employer]  [[facility]] Care Management service needs for post hospital transition.  Review of patient's medical record reveals patient is recommended to return to a skilled nursing level of care.  Recently from [facility].  Reviewed PT/OT and inpatient [facility] team notes regarding transition of care.   Plan:  No [facility] Care Management outreach planned at this time as patient's transitional needs are for a skilled nursing level of care, at this time.  Will sign off after transition completed.    For questions contact:   Charlesetta Shanks, RN BSN CCM [facility]  [phone] business mobile phone Toll free office [phone]  Fax number: [phone] [email or web address]

## 2021-05-11 NOTE — Discharge Instructions (Signed)
Follow with Primary MD Plotnikov, [name], MD in 7 days   Get CBC, CMP,  checked  by Primary MD next visit.    Activity: As tolerated with Full fall precautions use walker/cane & assistance as needed   Disposition snf   Diet: Dysphagia 1 with thin liquid   On your next visit with your primary care physician please Get Medicines reviewed and adjusted.   Please request your Prim.MD to go over all Hospital Tests and Procedure/Radiological results at the follow up, please get all Hospital records sent to your Prim MD by signing hospital release before you go home.   If you experience worsening of your admission symptoms, develop shortness of breath, life threatening emergency, suicidal or homicidal thoughts you must seek medical attention immediately by calling 911 or calling your MD immediately  if symptoms less severe.  You Must read complete instructions/literature along with all the possible adverse reactions/side effects for all the Medicines you take and that have been prescribed to you. Take any new Medicines after you have completely understood and accpet all the possible adverse reactions/side effects.   Do not drive, operating heavy machinery, perform activities at heights, swimming or participation in water activities or provide baby sitting services if your were admitted for syncope or siezures until you have seen by Primary MD or a Neurologist and advised to do so again.  Do not drive when taking Pain medications.    Do not take more than prescribed Pain, Sleep and Anxiety Medications  Special Instructions: If you have smoked or chewed Tobacco  in the last 2 yrs please stop smoking, stop any regular Alcohol  and or any Recreational drug use.  Wear Seat belts while driving.   Please note  You were cared for by a hospitalist during your hospital stay. If you have any questions about your discharge medications or the care you received while you were in the hospital after  you are discharged, you can call the unit and asked to speak with the hospitalist on call if the hospitalist that took care of you is not available. Once you are discharged, your primary care physician will handle any further medical issues. Please note that NO REFILLS for any discharge medications will be authorized once you are discharged, as it is imperative that you return to your primary care physician (or establish a relationship with a primary care physician if you do not have one) for your aftercare needs so that they can reassess your need for medications and monitor your lab values.

## 2021-05-11 NOTE — TOC Transition Note (Signed)
Transition of Care [facility]) - CM/SW Discharge Note   Patient Details  Name: [name] MRN: [id number] Date of Birth: 02-01-1933  Transition of Care [facility]) CM/SW Contact:  Mearl Latin, LCSW Phone Number: 05/11/2021, 2:33 PM   Clinical Narrative:    Patient will DC to: [facility] Anticipated DC date: 05/11/21 Family notified: Daughter, [name] Transport by: Sharin Mons   Per MD patient ready for DC to [place]. RN to call report prior to discharge [phone] room 1007p). RN, patient, patient's family, and facility notified of DC. Discharge Summary and FL2 sent to facility. DC packet on chart. Ambulance transport requested for patient.   CSW will sign off for now as social work intervention is no longer needed. Please consult [place] again if new needs arise.     Final next level of care: Skilled Nursing Facility Barriers to Discharge: No Barriers Identified   Patient Goals and CMS Choice Patient states their goals for this hospitalization and ongoing recovery are:: return to snf CMS Medicare.gov Compare Post Acute Care list provided to:: Patient Represenative (must comment) Choice offered to / list presented to : Adult Children  Discharge Placement   Existing PASRR number confirmed : 05/11/21          Patient chooses bed at: [facility] Patient to be transferred to facility by: [facility] Name of family member notified: [name] Patient and family notified of of transfer: 05/11/21  Discharge Plan and Services In-house Referral: Clinical Social Work   Post Acute Care Choice: Skilled Nursing Facility                               Social Determinants of Health (SDOH) Interventions     Readmission Risk Interventions Readmission Risk Prevention Plan 02/08/2020  Transportation Screening Complete  Medication Review Oceanographer) Complete  PCP or Specialist appointment within 3-5 days of discharge Complete  HRI or Home Care Consult Complete  SW Recovery Care/Counseling  Consult Complete  Palliative Care Screening Not Applicable  Skilled Nursing Facility Complete  Some recent data might be hidden

## 2021-05-11 NOTE — Discharge Summary (Addendum)
Physician Discharge Summary  [name] YNW:[id number] DOB: [birth year] DOA: 05/08/2021  PCP: Tresa Garter, MD  Admit date: 05/08/2021 Discharge date: 05/11/2021  Admitted From:  SNF Disposition:  SNF  Recommendations for Outpatient Follow-up:  Follow up with PCP in 1-2 weeks Please obtain BMP/CBC in one week    Discharge Condition:Stable CODE STATUS: Full, confirmed  by daughter [name] Diet recommendation: Dysphagia 1 with thin liquid  Brief/Interim Summary:  [name] is a 87 y.o. female with medical history significant of CAD status post PCI, chronic diastolic heart failure, TIA, hypertension, hyperlipidemia, diet-controlled type 2 diabetes, CKD stage II-IIIa, rheumatoid arthritis, depression, seizure disorder, GERD presented to the ED via EMS from [facility] for evaluation of altered mental status/lethargy, her work-up suggested toxic encephalopathy with sepsis due to UTI and she was admitted to the hospital.  Addendum:  Patient had difficulty taking Depakote tablets so this was switched to capsule(sprinkles) They are not to be chewed and can be swallowed whole with apple sauce.  Discharge Diagnoses:  Principal Problem:   UTI (urinary tract infection) Active Problems:   GERD   Sepsis (HCC)   Seizures (HCC)   AKI (acute kidney injury) (HCC)     Acute toxic encephalopathy in a patient with sepsis due to UTI - CT head negative with no focal deficits, no clinical suspicion for seizures, with supportive care which included empiric IV antibiotic, IV fluids her mentation has significantly improved.  Remains at risk for delirium.    UTI  -He was treated with Rocephin, urine for cultures unhelpful as growing multiple species, no need for further antibiotics on discharge     AKI .  Resolved after IV fluids   Anemia of chronic disease.  Lower due to hemodilution from IV fluids, anemia panel significant for elevated ferritin and low TIBC which is significant for  anemia of chronic disease, her hemoglobin is 7.3 on discharge, no indication for transfusion.   HX of seizure disorder.  On Depakote continue.   History of CAD.  No chest pain, EKG nonacute, mild elevation in troponin due to AKI and demand from sepsis, continue aspirin, statin and Coreg for secondary prevention.  No further work-up not a candidate for invasive tests or intervention.   Mild thrombocytopenia.  Due to sepsis monitor.   Chronic diastolic CHF.  Currently compensated, recent EF 60%, continue Coreg,   Hypertension.  On Coreg,    Discharge Instructions  Discharge Instructions     Diet - low sodium heart healthy   Complete by: As directed    Increase activity slowly   Complete by: As directed       Allergies as of 05/11/2021       Reactions   Ativan [lorazepam] Other (See Comments)   Very confused   Tramadol Hcl Anxiety, Rash   Headache   Amlodipine Besylate Other (See Comments)    dizzy   Atenolol Other (See Comments)   Fatigue   Benazepril Cough   Benicar [olmesartan Medoxomil] Other (See Comments)   HEADACHE   Cozaar Nausea And Vomiting   Hydralazine Other (See Comments)   Hair loss   Hydrochlorothiazide Other (See Comments)   Hydrochlorothiazide W-triamterene Other (See Comments)    dizzy   Hydrocodone Other (See Comments)   HEADACHE   Hydroxyzine Pamoate Other (See Comments)   Per MAR   Iodine Other (See Comments)   Per MAR   Lisinopril Other (See Comments), Cough   Tired & fatigue   Peach Flavor  Itching   Penicillins Itching   Has patient had a PCN reaction causing immediate rash, facial/tongue/throat swelling, SOB or lightheadedness with hypotension: No Has patient had a PCN reaction causing severe rash involving mucus membranes or skin necrosis: No Has patient had a PCN reaction that required hospitalization No Has patient had a PCN reaction occurring within the last 10 years: Yes If all of the above answers are "NO", then may proceed with  Cephalosporin use. tolerates cephalosporins OK   Pravastatin Other (See Comments)   Myalgias-muscle pain   Prednisolone Nausea Only, Other (See Comments)   Upset stomach   Strawberry Flavor Itching   Tramadol Hcl Other (See Comments)   headache   Benadryl [diphenhydramine] Itching, Palpitations        Medication List     STOP taking these medications    Accu-Chek Aviva Soln   divalproex 500 MG DR tablet Commonly known as: DEPAKOTE Replaced by: divalproex 125 MG capsule   glucose blood test strip Commonly known as: OneTouch Verio   ondansetron 4 MG tablet Commonly known as: ZOFRAN   OneTouch Delica Lancets Fine Misc   sodium phosphate 7-19 GM/118ML Enem       TAKE these medications    acetaminophen 325 MG tablet Commonly known as: TYLENOL Take 2 tablets (650 mg total) by mouth every 6 (six) hours as needed for mild pain, headache or moderate pain. What changed:  how much to take when to take this   aspirin EC 81 MG tablet Take 1 tablet (81 mg total) by mouth daily. Swallow whole.   B-complex with vitamin C tablet Take 1 tablet by mouth daily.   carvedilol 3.125 MG tablet Commonly known as: COREG Take 1 tablet (3.125 mg total) by mouth 2 (two) times daily with a meal. What changed:  medication strength how much to take   cholecalciferol 25 MCG (1000 UNIT) tablet Commonly known as: VITAMIN D Take 1,000 Units by mouth daily.   citalopram 10 MG tablet Commonly known as: CeleXA Take 1 tablet (10 mg total) by mouth daily.   diclofenac Sodium 1 % Gel Commonly known as: VOLTAREN Apply 1 application topically 2 (two) times daily as needed (pain).   divalproex 125 MG capsule Commonly known as: DEPAKOTE SPRINKLE Take 4 capsules (500 mg total) by mouth 2 (two) times daily. Replaces: divalproex 500 MG DR tablet   feeding supplement (GLUCERNA SHAKE) Liqd Take 237 mLs by mouth 3 (three) times daily between meals.   ferrous sulfate 325 (65 FE) MG  tablet Take 1 tablet (325 mg total) by mouth daily.   methotrexate 2.5 MG tablet Take 10 mg by mouth once a week. On Fridays   pantoprazole 40 MG tablet Commonly known as: Protonix Take 1 tablet (40 mg total) by mouth daily.   polyethylene glycol 17 g packet Commonly known as: MIRALAX / GLYCOLAX Take 17 g by mouth 3 (three) times a week.   rosuvastatin 10 MG tablet Commonly known as: CRESTOR Take 10 mg by mouth daily.   senna-docusate 8.6-50 MG tablet Commonly known as: Senokot-S Take 2 tablets by mouth 2 (two) times daily.   thiamine 100 MG tablet Take 1 tablet (100 mg total) by mouth daily.        Contact information for after-discharge care     Destination     [name] .   Service: Skilled Nursing Contact information: [address] [employer] [place] [address] [phone]  Allergies  Allergen Reactions   Ativan [Lorazepam] Other (See Comments)    Very confused   Tramadol Hcl Anxiety and Rash    Headache   Amlodipine Besylate Other (See Comments)     dizzy   Atenolol Other (See Comments)    Fatigue   Benazepril Cough   Benicar [Olmesartan Medoxomil] Other (See Comments)    HEADACHE   Cozaar Nausea And Vomiting   Hydralazine Other (See Comments)    Hair loss   Hydrochlorothiazide Other (See Comments)   Hydrochlorothiazide W-Triamterene Other (See Comments)     dizzy   Hydrocodone Other (See Comments)    HEADACHE   Hydroxyzine Pamoate Other (See Comments)    Per MAR   Iodine Other (See Comments)    Per MAR   Lisinopril Other (See Comments) and Cough    Tired & fatigue   Peach Flavor Itching   Penicillins Itching    Has patient had a PCN reaction causing immediate rash, facial/tongue/throat swelling, SOB or lightheadedness with hypotension: No Has patient had a PCN reaction causing severe rash involving mucus membranes or skin necrosis: No Has patient had a PCN reaction that required  hospitalization No Has patient had a PCN reaction occurring within the last 10 years: Yes If all of the above answers are "NO", then may proceed with Cephalosporin use.  tolerates cephalosporins OK    Pravastatin Other (See Comments)    Myalgias-muscle pain   Prednisolone Nausea Only and Other (See Comments)    Upset stomach   Strawberry Flavor Itching   Tramadol Hcl Other (See Comments)    headache   Benadryl [Diphenhydramine] Itching and Palpitations    Consultations: None   Procedures/Studies: CT Head Wo Contrast  Result Date: 05/08/2021 CLINICAL DATA:  Altered mental status. EXAM: CT HEAD WITHOUT CONTRAST TECHNIQUE: Contiguous axial images were obtained from the base of the skull through the vertex without intravenous contrast. COMPARISON:  January 08, 2021. FINDINGS: Brain: Mild diffuse cortical atrophy. Mild chronic ischemic white matter disease. No mass effect or midline shift is noted. Ventricular size is within normal limits. There is no evidence of mass lesion, hemorrhage or acute infarction. Vascular: No hyperdense vessel or unexpected calcification. Skull: Normal. Negative for fracture or focal lesion. Sinuses/Orbits: No acute finding. Other: None. IMPRESSION: No acute intracranial abnormality seen. Electronically Signed   By: Lupita Raider M.D.   On: 05/08/2021 15:50   DG Chest Port 1 View  Result Date: 05/08/2021 CLINICAL DATA:  Lethargy. EXAM: PORTABLE CHEST 1 VIEW COMPARISON:  Mar 21, 2021. FINDINGS: Stable cardiomediastinal silhouette. No pneumothorax or pleural effusion is noted. Stable minimal left basilar subsegmental atelectasis or scarring. Right lung is clear. Bony thorax is unremarkable. IMPRESSION: Stable minimal left basilar subsegmental atelectasis or scarring. Aortic Atherosclerosis (ICD10-I70.0). Electronically Signed   By: Lupita Raider M.D.   On: 05/08/2021 14:47   LONG TERM MONITOR (3-14 DAYS)  Result Date: 04/19/2021  Patch Wear Time: 5 days and 7  hours (2022-02-22T18:14:37-0500 to 2022-02-28T01:34:09-499) Patient had a min HR of 49 bpm, max HR of 137 bpm, and avg HR of 70 bpm. Predominant underlying rhythm was Sinus Rhythm. Bundle Branch Block/IVCD was present. Isolated SVEs were rare (<1.0%), SVE Couplets were rare (<1.0%), and SVE Triplets were rare (<1.0%). Isolated VEs were rare (<1.0%), VE Couplets were rare (<1.0%), and no VE Triplets were present.  Normal sinus rhythm Rare atrial and ventricular ectopy with an isolated ventricular couplet.     Subjective: No significant events overnight  as discussed with staff, patient herself denies any complaints today.  Discharge Exam: Vitals:   05/11/21 1500 05/11/21 2000  BP: (!) 156/63 (!) 161/69  Pulse: 78 89  Resp: (!) 21 19  Temp: 98.8 F (37.1 C) 98 F (36.7 C)  SpO2: (!) 79% 100%   Vitals:   05/11/21 0730 05/11/21 1100 05/11/21 1500 05/11/21 2000  BP: (!) 169/74 (!) 178/62 (!) 156/63 (!) 161/69  Pulse: 73 75 78 89  Resp: 14 19 (!) 21 19  Temp: 97.6 F (36.4 C) (!) 97.5 F (36.4 C) 98.8 F (37.1 C) 98 F (36.7 C)  TempSrc: Axillary Oral Oral Axillary  SpO2: 96% (!) 67% (!) 79% 100%  Weight:      Height:        General: Pt is alert, awake, not in acute distress, frail and deconditioned Cardiovascular: RRR, S1/S2 +, no rubs, no gallops Respiratory: CTA bilaterally, no wheezing, no rhonchi Abdominal: Soft, NT, ND, bowel sounds + Extremities: no edema, no cyanosis    The results of significant diagnostics from this hospitalization (including imaging, microbiology, ancillary and laboratory) are listed below for reference.     Microbiology: Recent Results (from the past 240 hour(s))  SARS CORONAVIRUS 2 (TAT 6-24 HRS) Nasopharyngeal Nasopharyngeal Swab     Status: None   Collection Time: 05/08/21  8:55 PM   Specimen: Nasopharyngeal Swab  Result Value Ref Range Status   SARS Coronavirus 2 NEGATIVE NEGATIVE Final    Comment: (NOTE) SARS-CoV-2 target nucleic acids  are NOT DETECTED.  The SARS-CoV-2 RNA is generally detectable in upper and lower respiratory specimens during the acute phase of infection. Negative results do not preclude SARS-CoV-2 infection, do not rule out co-infections with other pathogens, and should not be used as the sole basis for treatment or other patient management decisions. Negative results must be combined with clinical observations, patient history, and epidemiological information. The expected result is Negative.  Fact Sheet for Patients: HairSlick.no  Fact Sheet for Healthcare Providers: quierodirigir.com  This test is not yet approved or cleared by the [place] FDA and  has been authorized for detection and/or diagnosis of SARS-CoV-2 by FDA under an Emergency Use Authorization (EUA). This EUA will remain  in effect (meaning this test can be used) for the duration of the COVID-19 declaration under Se ction 564(b)(1) of the Act, 21 U.S.C. section 360bbb-3(b)(1), unless the authorization is terminated or revoked sooner.  Performed at [facility], [address], [place], [place] [address]   Culture, Urine     Status: Abnormal   Collection Time: 05/08/21  9:48 PM   Specimen: Urine, Random  Result Value Ref Range Status   Specimen Description URINE, RANDOM  Final   Special Requests   Final    NONE Performed at [facility], [address] [place], [place] [address]    Culture MULTIPLE SPECIES PRESENT, SUGGEST RECOLLECTION (A)  Final   Report Status 05/10/2021 FINAL  Final  Culture, blood (single)     Status: None (Preliminary result)   Collection Time: 05/09/21 12:28 AM   Specimen: BLOOD LEFT HAND  Result Value Ref Range Status   Specimen Description BLOOD LEFT HAND  Final   Special Requests   Final    BOTTLES DRAWN AEROBIC ONLY Blood Culture adequate volume   Culture   Final    NO GROWTH 2 DAYS Performed at [facility],  [address], [place], [place] [address]    Report  Status PENDING  Incomplete  Resp Panel by RT-PCR (Flu A&B, Covid) Nasopharyngeal Swab     Status: None   Collection Time: 05/11/21 10:43 AM   Specimen: Nasopharyngeal Swab; Nasopharyngeal(NP) swabs in vial transport medium  Result Value Ref Range Status   SARS Coronavirus 2 by RT PCR NEGATIVE NEGATIVE Final    Comment: (NOTE) SARS-CoV-2 target nucleic acids are NOT DETECTED.  The SARS-CoV-2 RNA is generally detectable in upper respiratory specimens during the acute phase of infection. The lowest concentration of SARS-CoV-2 viral copies this assay can detect is 138 copies/mL. A negative result does not preclude SARS-Cov-2 infection and should not be used as the sole basis for treatment or other patient management decisions. A negative result may occur with  improper specimen collection/handling, submission of specimen other than nasopharyngeal swab, presence of viral mutation(s) within the areas targeted by this assay, and inadequate number of viral copies(<138 copies/mL). A negative result must be combined with clinical observations, patient history, and epidemiological information. The expected result is Negative.  Fact Sheet for Patients:  BloggerCourse.com  Fact Sheet for Healthcare Providers:  SeriousBroker.it  This test is no t yet approved or cleared by the [place] FDA and  has been authorized for detection and/or diagnosis of SARS-CoV-2 by FDA under an Emergency Use Authorization (EUA). This EUA will remain  in effect (meaning this test can be used) for the duration of the COVID-19 declaration under Section 564(b)(1) of the Act, 21 U.S.C.section 360bbb-3(b)(1), unless the authorization is terminated  or revoked sooner.       Influenza A by PCR NEGATIVE NEGATIVE Final   Influenza B by PCR NEGATIVE NEGATIVE Final    Comment: (NOTE) The Xpert Xpress SARS-CoV-2/FLU/RSV  plus assay is intended as an aid in the diagnosis of influenza from Nasopharyngeal swab specimens and should not be used as a sole basis for treatment. Nasal washings and aspirates are unacceptable for Xpert Xpress SARS-CoV-2/FLU/RSV testing.  Fact Sheet for Patients: BloggerCourse.com  Fact Sheet for Healthcare Providers: SeriousBroker.it  This test is not yet approved or cleared by the [place] FDA and has been authorized for detection and/or diagnosis of SARS-CoV-2 by FDA under an Emergency Use Authorization (EUA). This EUA will remain in effect (meaning this test can be used) for the duration of the COVID-19 declaration under Section 564(b)(1) of the Act, 21 U.S.C. section 360bbb-3(b)(1), unless the authorization is terminated or revoked.  Performed at [facility], [address], [place], [place] [address]      Labs: BNP (last 3 results) Recent Labs    05/10/21 0111 05/11/21 0112  BNP 257.9* 583.5*   Basic Metabolic Panel: Recent Labs  Lab 05/08/21 1434 05/09/21 0457 05/10/21 0111 05/11/21 0112  NA 138 138 138 137  K 4.1 3.7 3.8 3.8  CL 105 102 106 107  CO2 23 23 22  21*  GLUCOSE 128* 76 107* 101*  BUN 24* 24* 20 19  CREATININE 1.22* 0.96 0.98 0.71  CALCIUM 8.8* 8.9 8.5* 8.6*  MG  --   --  1.5* 1.8   Liver Function Tests: Recent Labs  Lab 05/08/21 1434 05/10/21 0111 05/11/21 0112  AST 24 29 25   ALT 7 9 9   ALKPHOS 48 43 42  BILITOT 0.9 0.3 0.4  PROT 6.4* 5.0* 6.1*  ALBUMIN 2.4* 2.0* 2.2*   No results for input(s): LIPASE, AMYLASE in the last 168 hours. No results for input(s): AMMONIA in the last 168 hours. CBC: Recent Labs  Lab 05/08/21  1434 05/09/21 0028 05/10/21 0111 05/11/21 0112  WBC 7.6 7.7 7.0 5.6  NEUTROABS 6.5  --  5.2 3.6  HGB 8.1* 7.6* 7.3* 7.3*  HCT 25.8* 24.3* 23.1* 22.7*  MCV 105.3* 108.0* 106.9* 104.6*  PLT 124* PLATELET CLUMPS NOTED ON SMEAR, COUNT APPEARS  DECREASED 104* 116*   Cardiac Enzymes: No results for input(s): CKTOTAL, CKMB, CKMBINDEX, TROPONINI in the last 168 hours. BNP: Invalid input(s): POCBNP CBG: Recent Labs  Lab 05/11/21 0733 05/11/21 0833 05/11/21 1153 05/11/21 1718 05/11/21 2111  GLUCAP 54* 78 54* 70 75   D-Dimer No results for input(s): DDIMER in the last 72 hours. Hgb A1c No results for input(s): HGBA1C in the last 72 hours. Lipid Profile No results for input(s): CHOL, HDL, LDLCALC, TRIG, CHOLHDL, LDLDIRECT in the last 72 hours. Thyroid function studies No results for input(s): TSH, T4TOTAL, T3FREE, THYROIDAB in the last 72 hours.  Invalid input(s): FREET3 Anemia work up Recent Labs    05/09/21 0737  VITAMINB12 763  FOLATE 8.9  FERRITIN 592*  TIBC 137*  IRON 15*  RETICCTPCT 0.6   Urinalysis    Component Value Date/Time   COLORURINE AMBER (A) 05/08/2021 1950   APPEARANCEUR CLOUDY (A) 05/08/2021 1950   LABSPEC 1.018 05/08/2021 1950   LABSPEC 1.020 04/23/2006 1313   PHURINE 5.0 05/08/2021 1950   GLUCOSEU NEGATIVE 05/08/2021 1950   GLUCOSEU NEGATIVE 02/28/2016 1559   HGBUR NEGATIVE 05/08/2021 1950   BILIRUBINUR NEGATIVE 05/08/2021 1950   BILIRUBINUR Negative 04/23/2006 1313   KETONESUR 5 (A) 05/08/2021 1950   PROTEINUR NEGATIVE 05/08/2021 1950   UROBILINOGEN 0.2 02/28/2016 1559   NITRITE NEGATIVE 05/08/2021 1950   LEUKOCYTESUR MODERATE (A) 05/08/2021 1950   LEUKOCYTESUR Trace 04/23/2006 1313   Sepsis Labs Invalid input(s): PROCALCITONIN,  WBC,  LACTICIDVEN Microbiology Recent Results (from the past 240 hour(s))  SARS CORONAVIRUS 2 (TAT 6-24 HRS) Nasopharyngeal Nasopharyngeal Swab     Status: None   Collection Time: 05/08/21  8:55 PM   Specimen: Nasopharyngeal Swab  Result Value Ref Range Status   SARS Coronavirus 2 NEGATIVE NEGATIVE Final    Comment: (NOTE) SARS-CoV-2 target nucleic acids are NOT DETECTED.  The SARS-CoV-2 RNA is generally detectable in upper and lower respiratory  specimens during the acute phase of infection. Negative results do not preclude SARS-CoV-2 infection, do not rule out co-infections with other pathogens, and should not be used as the sole basis for treatment or other patient management decisions. Negative results must be combined with clinical observations, patient history, and epidemiological information. The expected result is Negative.  Fact Sheet for Patients: HairSlick.no  Fact Sheet for Healthcare Providers: quierodirigir.com  This test is not yet approved or cleared by the [place] FDA and  has been authorized for detection and/or diagnosis of SARS-CoV-2 by FDA under an Emergency Use Authorization (EUA). This EUA will remain  in effect (meaning this test can be used) for the duration of the COVID-19 declaration under Se ction 564(b)(1) of the Act, 21 U.S.C. section 360bbb-3(b)(1), unless the authorization is terminated or revoked sooner.  Performed at [facility], [address], [place], [place] [address]   Culture, Urine     Status: Abnormal   Collection Time: 05/08/21  9:48 PM   Specimen: Urine, Random  Result Value Ref Range Status   Specimen Description URINE, RANDOM  Final   Special Requests   Final    NONE Performed at [facility], [address], [place], [place] [address]  Culture MULTIPLE SPECIES PRESENT, SUGGEST RECOLLECTION (A)  Final   Report Status 05/10/2021 FINAL  Final  Culture, blood (single)     Status: None (Preliminary result)   Collection Time: 05/09/21 12:28 AM   Specimen: BLOOD LEFT HAND  Result Value Ref Range Status   Specimen Description BLOOD LEFT HAND  Final   Special Requests   Final    BOTTLES DRAWN AEROBIC ONLY Blood Culture adequate volume   Culture   Final    NO GROWTH 2 DAYS Performed at [facility], [address], [place], [place] [address]    Report Status PENDING  Incomplete  Resp Panel by  RT-PCR (Flu A&B, Covid) Nasopharyngeal Swab     Status: None   Collection Time: 05/11/21 10:43 AM   Specimen: Nasopharyngeal Swab; Nasopharyngeal(NP) swabs in vial transport medium  Result Value Ref Range Status   SARS Coronavirus 2 by RT PCR NEGATIVE NEGATIVE Final    Comment: (NOTE) SARS-CoV-2 target nucleic acids are NOT DETECTED.  The SARS-CoV-2 RNA is generally detectable in upper respiratory specimens during the acute phase of infection. The lowest concentration of SARS-CoV-2 viral copies this assay can detect is 138 copies/mL. A negative result does not preclude SARS-Cov-2 infection and should not be used as the sole basis for treatment or other patient management decisions. A negative result may occur with  improper specimen collection/handling, submission of specimen other than nasopharyngeal swab, presence of viral mutation(s) within the areas targeted by this assay, and inadequate number of viral copies(<138 copies/mL). A negative result must be combined with clinical observations, patient history, and epidemiological information. The expected result is Negative.  Fact Sheet for Patients:  BloggerCourse.com  Fact Sheet for Healthcare Providers:  SeriousBroker.it  This test is no t yet approved or cleared by the [place] FDA and  has been authorized for detection and/or diagnosis of SARS-CoV-2 by FDA under an Emergency Use Authorization (EUA). This EUA will remain  in effect (meaning this test can be used) for the duration of the COVID-19 declaration under Section 564(b)(1) of the Act, 21 U.S.C.section 360bbb-3(b)(1), unless the authorization is terminated  or revoked sooner.       Influenza A by PCR NEGATIVE NEGATIVE Final   Influenza B by PCR NEGATIVE NEGATIVE Final    Comment: (NOTE) The Xpert Xpress SARS-CoV-2/FLU/RSV plus assay is intended as an aid in the diagnosis of influenza from Nasopharyngeal swab  specimens and should not be used as a sole basis for treatment. Nasal washings and aspirates are unacceptable for Xpert Xpress SARS-CoV-2/FLU/RSV testing.  Fact Sheet for Patients: BloggerCourse.com  Fact Sheet for Healthcare Providers: SeriousBroker.it  This test is not yet approved or cleared by the [place] FDA and has been authorized for detection and/or diagnosis of SARS-CoV-2 by FDA under an Emergency Use Authorization (EUA). This EUA will remain in effect (meaning this test can be used) for the duration of the COVID-19 declaration under Section 564(b)(1) of the Act, 21 U.S.C. section 360bbb-3(b)(1), unless the authorization is terminated or revoked.  Performed at [facility], [address] [place], [place] [address]      Time coordinating discharge: Over 30 minutes  SIGNED:   Darlin Drop, MD  [facility] 05/11/2021, 10:48 PM Pager   If 7PM-7AM, please contact night-coverage [email or web address] Password [email or web address]

## 2021-05-11 NOTE — Plan of Care (Signed)
  Problem: Education: Goal: Knowledge of General Education information will improve Description Including pain rating scale, medication(s)/side effects and non-pharmacologic comfort measures Outcome: Progressing   

## 2021-05-12 DIAGNOSIS — A419 Sepsis, unspecified organism: Secondary | ICD-10-CM | POA: Diagnosis not present

## 2021-05-12 DIAGNOSIS — N390 Urinary tract infection, site not specified: Secondary | ICD-10-CM | POA: Diagnosis not present

## 2021-05-12 DIAGNOSIS — R293 Abnormal posture: Secondary | ICD-10-CM | POA: Diagnosis not present

## 2021-05-12 DIAGNOSIS — N179 Acute kidney failure, unspecified: Secondary | ICD-10-CM | POA: Diagnosis not present

## 2021-05-12 DIAGNOSIS — G928 Other toxic encephalopathy: Secondary | ICD-10-CM | POA: Diagnosis not present

## 2021-05-12 DIAGNOSIS — G929 Unspecified toxic encephalopathy: Secondary | ICD-10-CM | POA: Diagnosis not present

## 2021-05-12 DIAGNOSIS — M24561 Contracture, right knee: Secondary | ICD-10-CM | POA: Diagnosis not present

## 2021-05-12 DIAGNOSIS — R278 Other lack of coordination: Secondary | ICD-10-CM | POA: Diagnosis not present

## 2021-05-12 DIAGNOSIS — M24562 Contracture, left knee: Secondary | ICD-10-CM | POA: Diagnosis not present

## 2021-05-12 DIAGNOSIS — M24572 Contracture, left ankle: Secondary | ICD-10-CM | POA: Diagnosis not present

## 2021-05-12 DIAGNOSIS — M1990 Unspecified osteoarthritis, unspecified site: Secondary | ICD-10-CM | POA: Diagnosis not present

## 2021-05-12 DIAGNOSIS — M24571 Contracture, right ankle: Secondary | ICD-10-CM | POA: Diagnosis not present

## 2021-05-12 DIAGNOSIS — R4182 Altered mental status, unspecified: Secondary | ICD-10-CM | POA: Diagnosis not present

## 2021-05-12 LAB — TYPE AND SCREEN
ABO/RH(D): B POS
Antibody Screen: POSITIVE
DAT, IgG: NEGATIVE
DAT, complement: NEGATIVE
Donor AG Type: NEGATIVE
Donor AG Type: NEGATIVE
Unit division: 0
Unit division: 0

## 2021-05-12 LAB — BPAM RBC
Blood Product Expiration Date: 202207152359
Blood Product Expiration Date: 202207152359
ISSUE DATE / TIME: 202206271042
ISSUE DATE / TIME: 202206271042
Unit Type and Rh: 7300
Unit Type and Rh: 7300

## 2021-05-14 LAB — CULTURE, BLOOD (SINGLE)
Culture: NO GROWTH
Special Requests: ADEQUATE

## 2021-05-15 DIAGNOSIS — G928 Other toxic encephalopathy: Secondary | ICD-10-CM | POA: Diagnosis not present

## 2021-05-15 DIAGNOSIS — M24562 Contracture, left knee: Secondary | ICD-10-CM | POA: Diagnosis not present

## 2021-05-15 DIAGNOSIS — M24572 Contracture, left ankle: Secondary | ICD-10-CM | POA: Diagnosis not present

## 2021-05-15 DIAGNOSIS — R293 Abnormal posture: Secondary | ICD-10-CM | POA: Diagnosis not present

## 2021-05-15 DIAGNOSIS — R278 Other lack of coordination: Secondary | ICD-10-CM | POA: Diagnosis not present

## 2021-05-15 DIAGNOSIS — M24561 Contracture, right knee: Secondary | ICD-10-CM | POA: Diagnosis not present

## 2021-05-15 DIAGNOSIS — R4182 Altered mental status, unspecified: Secondary | ICD-10-CM | POA: Diagnosis not present

## 2021-05-15 DIAGNOSIS — M24571 Contracture, right ankle: Secondary | ICD-10-CM | POA: Diagnosis not present

## 2021-05-15 DIAGNOSIS — A419 Sepsis, unspecified organism: Secondary | ICD-10-CM | POA: Diagnosis not present

## 2021-05-16 DIAGNOSIS — I248 Other forms of acute ischemic heart disease: Secondary | ICD-10-CM | POA: Diagnosis not present

## 2021-05-16 DIAGNOSIS — D696 Thrombocytopenia, unspecified: Secondary | ICD-10-CM | POA: Diagnosis not present

## 2021-05-16 DIAGNOSIS — G6289 Other specified polyneuropathies: Secondary | ICD-10-CM | POA: Diagnosis not present

## 2021-05-16 DIAGNOSIS — K5901 Slow transit constipation: Secondary | ICD-10-CM | POA: Diagnosis not present

## 2021-05-16 DIAGNOSIS — N390 Urinary tract infection, site not specified: Secondary | ICD-10-CM | POA: Diagnosis not present

## 2021-05-16 DIAGNOSIS — G40909 Epilepsy, unspecified, not intractable, without status epilepticus: Secondary | ICD-10-CM | POA: Diagnosis not present

## 2021-05-16 DIAGNOSIS — D638 Anemia in other chronic diseases classified elsewhere: Secondary | ICD-10-CM | POA: Diagnosis not present

## 2021-05-16 DIAGNOSIS — R030 Elevated blood-pressure reading, without diagnosis of hypertension: Secondary | ICD-10-CM | POA: Diagnosis not present

## 2021-05-16 DIAGNOSIS — N179 Acute kidney failure, unspecified: Secondary | ICD-10-CM | POA: Diagnosis not present

## 2021-05-22 DIAGNOSIS — M24571 Contracture, right ankle: Secondary | ICD-10-CM | POA: Diagnosis not present

## 2021-05-22 DIAGNOSIS — M24572 Contracture, left ankle: Secondary | ICD-10-CM | POA: Diagnosis not present

## 2021-05-22 DIAGNOSIS — R4182 Altered mental status, unspecified: Secondary | ICD-10-CM | POA: Diagnosis not present

## 2021-05-22 DIAGNOSIS — R293 Abnormal posture: Secondary | ICD-10-CM | POA: Diagnosis not present

## 2021-05-22 DIAGNOSIS — G928 Other toxic encephalopathy: Secondary | ICD-10-CM | POA: Diagnosis not present

## 2021-05-22 DIAGNOSIS — M24562 Contracture, left knee: Secondary | ICD-10-CM | POA: Diagnosis not present

## 2021-05-22 DIAGNOSIS — M24561 Contracture, right knee: Secondary | ICD-10-CM | POA: Diagnosis not present

## 2021-05-22 DIAGNOSIS — A419 Sepsis, unspecified organism: Secondary | ICD-10-CM | POA: Diagnosis not present

## 2021-05-22 DIAGNOSIS — R278 Other lack of coordination: Secondary | ICD-10-CM | POA: Diagnosis not present

## 2021-05-23 ENCOUNTER — Non-Acute Institutional Stay: Payer: Self-pay | Admitting: *Deleted

## 2021-05-23 ENCOUNTER — Other Ambulatory Visit: Payer: Self-pay

## 2021-05-23 DIAGNOSIS — R293 Abnormal posture: Secondary | ICD-10-CM | POA: Diagnosis not present

## 2021-05-23 DIAGNOSIS — R278 Other lack of coordination: Secondary | ICD-10-CM | POA: Diagnosis not present

## 2021-05-23 DIAGNOSIS — Z515 Encounter for palliative care: Secondary | ICD-10-CM

## 2021-05-23 DIAGNOSIS — M24572 Contracture, left ankle: Secondary | ICD-10-CM | POA: Diagnosis not present

## 2021-05-23 DIAGNOSIS — M24571 Contracture, right ankle: Secondary | ICD-10-CM | POA: Diagnosis not present

## 2021-05-23 DIAGNOSIS — A419 Sepsis, unspecified organism: Secondary | ICD-10-CM | POA: Diagnosis not present

## 2021-05-23 DIAGNOSIS — G928 Other toxic encephalopathy: Secondary | ICD-10-CM | POA: Diagnosis not present

## 2021-05-23 DIAGNOSIS — R4182 Altered mental status, unspecified: Secondary | ICD-10-CM | POA: Diagnosis not present

## 2021-05-23 DIAGNOSIS — M24561 Contracture, right knee: Secondary | ICD-10-CM | POA: Diagnosis not present

## 2021-05-23 DIAGNOSIS — M24562 Contracture, left knee: Secondary | ICD-10-CM | POA: Diagnosis not present

## 2021-05-23 NOTE — Progress Notes (Signed)
[facility]   PATIENT NAME: [name] DOB: 03/18/1933 MRN: [id number]  PRIMARY CARE PROVIDER: Tresa Garter, MD  RESPONSIBLE PARTY: Lurene Shadow (daughter) Acct ID - Guarantor Home Phone Work Phone Relationship Acct Type  [id number] AYSIA, LOWDER* [phone]  Self P/F     [address] [place], [place] [address]   Covid-19 Pre-screening Negative  PLAN OF CARE and INTERVENTION:  ADVANCE CARE PLANNING/GOALS OF CARE: Goal is for patient to remain at current facility, [facility], avoid hospitalizations and comfort care only. She has a DNR.  PATIENT/CAREGIVER EDUCATION: Symptom management DISEASE STATUS: Initial palliative care face-to-face visit completed in patient's room at the facility. Upon my arrival, patient is lying in bed asleep. She is lethargic. She did open her eyes briefly to verbal and tactile stimulation. She did answer some questions with a yes or no. Her voice is weak, soft and difficult to hear. No physical indicators of pain noted. Respirations even, regular and unlabored. She is total care for all ADLs. She is non-ambulatory. She was recently hospitalized from 05/08/21 to 05/11/21 due to toxic encephalopathy with sepsis from a UTI. She was treated with IV antibiotics and IV fluids and discharged back to the SNF. Spoke with staff who reports that prior to hospitalization, patient was eating about 50% of meals, more alert and engaging and able to feed herself independently. Patient is now more lethargic and less engaging. She is sleeping throughout much of the day. She is only eating about 25% or less of meals, at times only bites and sips. She now has to be fed at mealtime. She is on a Pureed diet. Staff reports that patient is taking all of her medications crushed in applesauce and drinking her Medpass for nutritional supplementation. She is incontinent of both bowel and bladder and wears adult briefs. Facility states that her daughter wants  comfort measures only. Will continue to monitor.   HISTORY OF PRESENT ILLNESS: This is a 87 yo female with a history of CAD, CHF, TIA, hypertension, hyperlipidemia, DM II (diet controlled), CKD stage 3, depression, seizure disorder and GERD. Palliative care team asked to follow patient for goals of care and complex decision making.   CODE STATUS: DNR ADVANCED DIRECTIVES: Y MOST FORM: no PPS: 30%   PHYSICAL EXAM:   LUNGS: clear to auscultation  CARDIAC: Cor RRR EXTREMITIES: Trace bilateral LE edema SKIN:  Exposed skin is dry and intact   NEURO:  Alert and oriented to self, lethargic, increased generalized weakness, total care   (Duration of visit and documentation 45 minutes)   Candiss Norse, RN BSN

## 2021-05-24 DIAGNOSIS — M0579 Rheumatoid arthritis with rheumatoid factor of multiple sites without organ or systems involvement: Secondary | ICD-10-CM | POA: Diagnosis not present

## 2021-05-24 DIAGNOSIS — M24572 Contracture, left ankle: Secondary | ICD-10-CM | POA: Diagnosis not present

## 2021-05-24 DIAGNOSIS — M24562 Contracture, left knee: Secondary | ICD-10-CM | POA: Diagnosis not present

## 2021-05-24 DIAGNOSIS — M069 Rheumatoid arthritis, unspecified: Secondary | ICD-10-CM | POA: Diagnosis not present

## 2021-05-24 DIAGNOSIS — R278 Other lack of coordination: Secondary | ICD-10-CM | POA: Diagnosis not present

## 2021-05-24 DIAGNOSIS — E1159 Type 2 diabetes mellitus with other circulatory complications: Secondary | ICD-10-CM | POA: Diagnosis not present

## 2021-05-24 DIAGNOSIS — G928 Other toxic encephalopathy: Secondary | ICD-10-CM | POA: Diagnosis not present

## 2021-05-24 DIAGNOSIS — R5383 Other fatigue: Secondary | ICD-10-CM | POA: Diagnosis not present

## 2021-05-24 DIAGNOSIS — D649 Anemia, unspecified: Secondary | ICD-10-CM | POA: Diagnosis not present

## 2021-05-24 DIAGNOSIS — R293 Abnormal posture: Secondary | ICD-10-CM | POA: Diagnosis not present

## 2021-05-24 DIAGNOSIS — R4182 Altered mental status, unspecified: Secondary | ICD-10-CM | POA: Diagnosis not present

## 2021-05-24 DIAGNOSIS — M1990 Unspecified osteoarthritis, unspecified site: Secondary | ICD-10-CM | POA: Diagnosis not present

## 2021-05-24 DIAGNOSIS — M79643 Pain in unspecified hand: Secondary | ICD-10-CM | POA: Diagnosis not present

## 2021-05-24 DIAGNOSIS — Z79899 Other long term (current) drug therapy: Secondary | ICD-10-CM | POA: Diagnosis not present

## 2021-05-24 DIAGNOSIS — M24571 Contracture, right ankle: Secondary | ICD-10-CM | POA: Diagnosis not present

## 2021-05-24 DIAGNOSIS — G40909 Epilepsy, unspecified, not intractable, without status epilepticus: Secondary | ICD-10-CM | POA: Diagnosis not present

## 2021-05-24 DIAGNOSIS — M24561 Contracture, right knee: Secondary | ICD-10-CM | POA: Diagnosis not present

## 2021-05-24 DIAGNOSIS — A419 Sepsis, unspecified organism: Secondary | ICD-10-CM | POA: Diagnosis not present

## 2021-05-25 DIAGNOSIS — E119 Type 2 diabetes mellitus without complications: Secondary | ICD-10-CM | POA: Diagnosis not present

## 2021-05-25 DIAGNOSIS — B179 Acute viral hepatitis, unspecified: Secondary | ICD-10-CM | POA: Diagnosis not present

## 2021-05-25 DIAGNOSIS — M24571 Contracture, right ankle: Secondary | ICD-10-CM | POA: Diagnosis not present

## 2021-05-25 DIAGNOSIS — I10 Essential (primary) hypertension: Secondary | ICD-10-CM | POA: Diagnosis not present

## 2021-05-25 DIAGNOSIS — I2510 Atherosclerotic heart disease of native coronary artery without angina pectoris: Secondary | ICD-10-CM | POA: Diagnosis not present

## 2021-05-25 DIAGNOSIS — R4182 Altered mental status, unspecified: Secondary | ICD-10-CM | POA: Diagnosis not present

## 2021-05-25 DIAGNOSIS — M24561 Contracture, right knee: Secondary | ICD-10-CM | POA: Diagnosis not present

## 2021-05-25 DIAGNOSIS — M24572 Contracture, left ankle: Secondary | ICD-10-CM | POA: Diagnosis not present

## 2021-05-25 DIAGNOSIS — M24562 Contracture, left knee: Secondary | ICD-10-CM | POA: Diagnosis not present

## 2021-05-25 DIAGNOSIS — R278 Other lack of coordination: Secondary | ICD-10-CM | POA: Diagnosis not present

## 2021-05-25 DIAGNOSIS — A419 Sepsis, unspecified organism: Secondary | ICD-10-CM | POA: Diagnosis not present

## 2021-05-25 DIAGNOSIS — R293 Abnormal posture: Secondary | ICD-10-CM | POA: Diagnosis not present

## 2021-05-25 DIAGNOSIS — M6281 Muscle weakness (generalized): Secondary | ICD-10-CM | POA: Diagnosis not present

## 2021-05-25 DIAGNOSIS — G928 Other toxic encephalopathy: Secondary | ICD-10-CM | POA: Diagnosis not present

## 2021-05-26 DIAGNOSIS — M069 Rheumatoid arthritis, unspecified: Secondary | ICD-10-CM | POA: Diagnosis not present

## 2021-05-26 DIAGNOSIS — D649 Anemia, unspecified: Secondary | ICD-10-CM | POA: Diagnosis not present

## 2021-05-26 DIAGNOSIS — A419 Sepsis, unspecified organism: Secondary | ICD-10-CM | POA: Diagnosis not present

## 2021-05-26 DIAGNOSIS — M24571 Contracture, right ankle: Secondary | ICD-10-CM | POA: Diagnosis not present

## 2021-05-26 DIAGNOSIS — H109 Unspecified conjunctivitis: Secondary | ICD-10-CM | POA: Diagnosis not present

## 2021-05-26 DIAGNOSIS — M24561 Contracture, right knee: Secondary | ICD-10-CM | POA: Diagnosis not present

## 2021-05-26 DIAGNOSIS — F32A Depression, unspecified: Secondary | ICD-10-CM | POA: Diagnosis not present

## 2021-05-26 DIAGNOSIS — M24562 Contracture, left knee: Secondary | ICD-10-CM | POA: Diagnosis not present

## 2021-05-26 DIAGNOSIS — R293 Abnormal posture: Secondary | ICD-10-CM | POA: Diagnosis not present

## 2021-05-26 DIAGNOSIS — G9341 Metabolic encephalopathy: Secondary | ICD-10-CM | POA: Diagnosis not present

## 2021-05-26 DIAGNOSIS — G928 Other toxic encephalopathy: Secondary | ICD-10-CM | POA: Diagnosis not present

## 2021-05-26 DIAGNOSIS — I5032 Chronic diastolic (congestive) heart failure: Secondary | ICD-10-CM | POA: Diagnosis not present

## 2021-05-26 DIAGNOSIS — M24572 Contracture, left ankle: Secondary | ICD-10-CM | POA: Diagnosis not present

## 2021-05-26 DIAGNOSIS — G4089 Other seizures: Secondary | ICD-10-CM | POA: Diagnosis not present

## 2021-05-26 DIAGNOSIS — R4182 Altered mental status, unspecified: Secondary | ICD-10-CM | POA: Diagnosis not present

## 2021-05-26 DIAGNOSIS — R278 Other lack of coordination: Secondary | ICD-10-CM | POA: Diagnosis not present

## 2021-05-27 DIAGNOSIS — M24571 Contracture, right ankle: Secondary | ICD-10-CM | POA: Diagnosis not present

## 2021-05-27 DIAGNOSIS — M24561 Contracture, right knee: Secondary | ICD-10-CM | POA: Diagnosis not present

## 2021-05-27 DIAGNOSIS — A419 Sepsis, unspecified organism: Secondary | ICD-10-CM | POA: Diagnosis not present

## 2021-05-27 DIAGNOSIS — R278 Other lack of coordination: Secondary | ICD-10-CM | POA: Diagnosis not present

## 2021-05-27 DIAGNOSIS — R4182 Altered mental status, unspecified: Secondary | ICD-10-CM | POA: Diagnosis not present

## 2021-05-27 DIAGNOSIS — R293 Abnormal posture: Secondary | ICD-10-CM | POA: Diagnosis not present

## 2021-05-27 DIAGNOSIS — M24572 Contracture, left ankle: Secondary | ICD-10-CM | POA: Diagnosis not present

## 2021-05-27 DIAGNOSIS — M24562 Contracture, left knee: Secondary | ICD-10-CM | POA: Diagnosis not present

## 2021-05-27 DIAGNOSIS — G928 Other toxic encephalopathy: Secondary | ICD-10-CM | POA: Diagnosis not present

## 2021-05-28 DIAGNOSIS — R293 Abnormal posture: Secondary | ICD-10-CM | POA: Diagnosis not present

## 2021-05-28 DIAGNOSIS — G928 Other toxic encephalopathy: Secondary | ICD-10-CM | POA: Diagnosis not present

## 2021-05-28 DIAGNOSIS — M24561 Contracture, right knee: Secondary | ICD-10-CM | POA: Diagnosis not present

## 2021-05-28 DIAGNOSIS — A419 Sepsis, unspecified organism: Secondary | ICD-10-CM | POA: Diagnosis not present

## 2021-05-28 DIAGNOSIS — M24571 Contracture, right ankle: Secondary | ICD-10-CM | POA: Diagnosis not present

## 2021-05-28 DIAGNOSIS — M24572 Contracture, left ankle: Secondary | ICD-10-CM | POA: Diagnosis not present

## 2021-05-28 DIAGNOSIS — R4182 Altered mental status, unspecified: Secondary | ICD-10-CM | POA: Diagnosis not present

## 2021-05-28 DIAGNOSIS — M24562 Contracture, left knee: Secondary | ICD-10-CM | POA: Diagnosis not present

## 2021-05-28 DIAGNOSIS — R278 Other lack of coordination: Secondary | ICD-10-CM | POA: Diagnosis not present

## 2021-05-29 DIAGNOSIS — R293 Abnormal posture: Secondary | ICD-10-CM | POA: Diagnosis not present

## 2021-05-29 DIAGNOSIS — M24571 Contracture, right ankle: Secondary | ICD-10-CM | POA: Diagnosis not present

## 2021-05-29 DIAGNOSIS — M24572 Contracture, left ankle: Secondary | ICD-10-CM | POA: Diagnosis not present

## 2021-05-29 DIAGNOSIS — G928 Other toxic encephalopathy: Secondary | ICD-10-CM | POA: Diagnosis not present

## 2021-05-29 DIAGNOSIS — R278 Other lack of coordination: Secondary | ICD-10-CM | POA: Diagnosis not present

## 2021-05-29 DIAGNOSIS — R4182 Altered mental status, unspecified: Secondary | ICD-10-CM | POA: Diagnosis not present

## 2021-05-29 DIAGNOSIS — M24561 Contracture, right knee: Secondary | ICD-10-CM | POA: Diagnosis not present

## 2021-05-29 DIAGNOSIS — M24562 Contracture, left knee: Secondary | ICD-10-CM | POA: Diagnosis not present

## 2021-05-29 DIAGNOSIS — A419 Sepsis, unspecified organism: Secondary | ICD-10-CM | POA: Diagnosis not present

## 2021-05-30 DIAGNOSIS — R293 Abnormal posture: Secondary | ICD-10-CM | POA: Diagnosis not present

## 2021-05-30 DIAGNOSIS — M24561 Contracture, right knee: Secondary | ICD-10-CM | POA: Diagnosis not present

## 2021-05-30 DIAGNOSIS — R4182 Altered mental status, unspecified: Secondary | ICD-10-CM | POA: Diagnosis not present

## 2021-05-30 DIAGNOSIS — M24562 Contracture, left knee: Secondary | ICD-10-CM | POA: Diagnosis not present

## 2021-05-30 DIAGNOSIS — R278 Other lack of coordination: Secondary | ICD-10-CM | POA: Diagnosis not present

## 2021-05-30 DIAGNOSIS — A419 Sepsis, unspecified organism: Secondary | ICD-10-CM | POA: Diagnosis not present

## 2021-05-30 DIAGNOSIS — L89153 Pressure ulcer of sacral region, stage 3: Secondary | ICD-10-CM | POA: Diagnosis not present

## 2021-05-30 DIAGNOSIS — M24572 Contracture, left ankle: Secondary | ICD-10-CM | POA: Diagnosis not present

## 2021-05-30 DIAGNOSIS — G928 Other toxic encephalopathy: Secondary | ICD-10-CM | POA: Diagnosis not present

## 2021-05-30 DIAGNOSIS — M24571 Contracture, right ankle: Secondary | ICD-10-CM | POA: Diagnosis not present

## 2021-05-31 DIAGNOSIS — G928 Other toxic encephalopathy: Secondary | ICD-10-CM | POA: Diagnosis not present

## 2021-05-31 DIAGNOSIS — R278 Other lack of coordination: Secondary | ICD-10-CM | POA: Diagnosis not present

## 2021-05-31 DIAGNOSIS — M24572 Contracture, left ankle: Secondary | ICD-10-CM | POA: Diagnosis not present

## 2021-05-31 DIAGNOSIS — R030 Elevated blood-pressure reading, without diagnosis of hypertension: Secondary | ICD-10-CM | POA: Diagnosis not present

## 2021-05-31 DIAGNOSIS — M24571 Contracture, right ankle: Secondary | ICD-10-CM | POA: Diagnosis not present

## 2021-05-31 DIAGNOSIS — R293 Abnormal posture: Secondary | ICD-10-CM | POA: Diagnosis not present

## 2021-05-31 DIAGNOSIS — G9341 Metabolic encephalopathy: Secondary | ICD-10-CM | POA: Diagnosis not present

## 2021-05-31 DIAGNOSIS — N390 Urinary tract infection, site not specified: Secondary | ICD-10-CM | POA: Diagnosis not present

## 2021-05-31 DIAGNOSIS — E1159 Type 2 diabetes mellitus with other circulatory complications: Secondary | ICD-10-CM | POA: Diagnosis not present

## 2021-05-31 DIAGNOSIS — M24561 Contracture, right knee: Secondary | ICD-10-CM | POA: Diagnosis not present

## 2021-05-31 DIAGNOSIS — A419 Sepsis, unspecified organism: Secondary | ICD-10-CM | POA: Diagnosis not present

## 2021-05-31 DIAGNOSIS — R4182 Altered mental status, unspecified: Secondary | ICD-10-CM | POA: Diagnosis not present

## 2021-05-31 DIAGNOSIS — M24562 Contracture, left knee: Secondary | ICD-10-CM | POA: Diagnosis not present

## 2021-06-01 DIAGNOSIS — M24572 Contracture, left ankle: Secondary | ICD-10-CM | POA: Diagnosis not present

## 2021-06-01 DIAGNOSIS — R4182 Altered mental status, unspecified: Secondary | ICD-10-CM | POA: Diagnosis not present

## 2021-06-01 DIAGNOSIS — M24571 Contracture, right ankle: Secondary | ICD-10-CM | POA: Diagnosis not present

## 2021-06-01 DIAGNOSIS — L89153 Pressure ulcer of sacral region, stage 3: Secondary | ICD-10-CM | POA: Diagnosis not present

## 2021-06-01 DIAGNOSIS — A419 Sepsis, unspecified organism: Secondary | ICD-10-CM | POA: Diagnosis not present

## 2021-06-01 DIAGNOSIS — R293 Abnormal posture: Secondary | ICD-10-CM | POA: Diagnosis not present

## 2021-06-01 DIAGNOSIS — G928 Other toxic encephalopathy: Secondary | ICD-10-CM | POA: Diagnosis not present

## 2021-06-01 DIAGNOSIS — M24562 Contracture, left knee: Secondary | ICD-10-CM | POA: Diagnosis not present

## 2021-06-01 DIAGNOSIS — R278 Other lack of coordination: Secondary | ICD-10-CM | POA: Diagnosis not present

## 2021-06-01 DIAGNOSIS — M24561 Contracture, right knee: Secondary | ICD-10-CM | POA: Diagnosis not present

## 2021-06-02 DIAGNOSIS — G928 Other toxic encephalopathy: Secondary | ICD-10-CM | POA: Diagnosis not present

## 2021-06-02 DIAGNOSIS — R4182 Altered mental status, unspecified: Secondary | ICD-10-CM | POA: Diagnosis not present

## 2021-06-02 DIAGNOSIS — R278 Other lack of coordination: Secondary | ICD-10-CM | POA: Diagnosis not present

## 2021-06-02 DIAGNOSIS — M24571 Contracture, right ankle: Secondary | ICD-10-CM | POA: Diagnosis not present

## 2021-06-02 DIAGNOSIS — E860 Dehydration: Secondary | ICD-10-CM | POA: Diagnosis not present

## 2021-06-02 DIAGNOSIS — N390 Urinary tract infection, site not specified: Secondary | ICD-10-CM | POA: Diagnosis not present

## 2021-06-02 DIAGNOSIS — Z515 Encounter for palliative care: Secondary | ICD-10-CM | POA: Diagnosis not present

## 2021-06-02 DIAGNOSIS — M24572 Contracture, left ankle: Secondary | ICD-10-CM | POA: Diagnosis not present

## 2021-06-02 DIAGNOSIS — A419 Sepsis, unspecified organism: Secondary | ICD-10-CM | POA: Diagnosis not present

## 2021-06-02 DIAGNOSIS — G9341 Metabolic encephalopathy: Secondary | ICD-10-CM | POA: Diagnosis not present

## 2021-06-02 DIAGNOSIS — R293 Abnormal posture: Secondary | ICD-10-CM | POA: Diagnosis not present

## 2021-06-02 DIAGNOSIS — M24562 Contracture, left knee: Secondary | ICD-10-CM | POA: Diagnosis not present

## 2021-06-02 DIAGNOSIS — M24561 Contracture, right knee: Secondary | ICD-10-CM | POA: Diagnosis not present

## 2021-06-03 DIAGNOSIS — M24572 Contracture, left ankle: Secondary | ICD-10-CM | POA: Diagnosis not present

## 2021-06-03 DIAGNOSIS — R293 Abnormal posture: Secondary | ICD-10-CM | POA: Diagnosis not present

## 2021-06-03 DIAGNOSIS — G928 Other toxic encephalopathy: Secondary | ICD-10-CM | POA: Diagnosis not present

## 2021-06-03 DIAGNOSIS — R4182 Altered mental status, unspecified: Secondary | ICD-10-CM | POA: Diagnosis not present

## 2021-06-03 DIAGNOSIS — A419 Sepsis, unspecified organism: Secondary | ICD-10-CM | POA: Diagnosis not present

## 2021-06-03 DIAGNOSIS — M24571 Contracture, right ankle: Secondary | ICD-10-CM | POA: Diagnosis not present

## 2021-06-03 DIAGNOSIS — R278 Other lack of coordination: Secondary | ICD-10-CM | POA: Diagnosis not present

## 2021-06-03 DIAGNOSIS — M24562 Contracture, left knee: Secondary | ICD-10-CM | POA: Diagnosis not present

## 2021-06-03 DIAGNOSIS — N183 Chronic kidney disease, stage 3 unspecified: Secondary | ICD-10-CM | POA: Diagnosis not present

## 2021-06-03 DIAGNOSIS — M24561 Contracture, right knee: Secondary | ICD-10-CM | POA: Diagnosis not present

## 2021-06-04 DIAGNOSIS — R278 Other lack of coordination: Secondary | ICD-10-CM | POA: Diagnosis not present

## 2021-06-04 DIAGNOSIS — R293 Abnormal posture: Secondary | ICD-10-CM | POA: Diagnosis not present

## 2021-06-04 DIAGNOSIS — G928 Other toxic encephalopathy: Secondary | ICD-10-CM | POA: Diagnosis not present

## 2021-06-04 DIAGNOSIS — M24562 Contracture, left knee: Secondary | ICD-10-CM | POA: Diagnosis not present

## 2021-06-04 DIAGNOSIS — M24572 Contracture, left ankle: Secondary | ICD-10-CM | POA: Diagnosis not present

## 2021-06-04 DIAGNOSIS — M24571 Contracture, right ankle: Secondary | ICD-10-CM | POA: Diagnosis not present

## 2021-06-04 DIAGNOSIS — R4182 Altered mental status, unspecified: Secondary | ICD-10-CM | POA: Diagnosis not present

## 2021-06-04 DIAGNOSIS — M24561 Contracture, right knee: Secondary | ICD-10-CM | POA: Diagnosis not present

## 2021-06-04 DIAGNOSIS — A419 Sepsis, unspecified organism: Secondary | ICD-10-CM | POA: Diagnosis not present

## 2021-06-05 ENCOUNTER — Non-Acute Institutional Stay: Payer: Medicaid Other | Admitting: *Deleted

## 2021-06-05 ENCOUNTER — Other Ambulatory Visit: Payer: Self-pay

## 2021-06-05 DIAGNOSIS — R4182 Altered mental status, unspecified: Secondary | ICD-10-CM | POA: Diagnosis not present

## 2021-06-05 DIAGNOSIS — R630 Anorexia: Secondary | ICD-10-CM | POA: Diagnosis not present

## 2021-06-05 DIAGNOSIS — I5032 Chronic diastolic (congestive) heart failure: Secondary | ICD-10-CM | POA: Diagnosis not present

## 2021-06-05 DIAGNOSIS — M24561 Contracture, right knee: Secondary | ICD-10-CM | POA: Diagnosis not present

## 2021-06-05 DIAGNOSIS — K6289 Other specified diseases of anus and rectum: Secondary | ICD-10-CM | POA: Diagnosis not present

## 2021-06-05 DIAGNOSIS — G9341 Metabolic encephalopathy: Secondary | ICD-10-CM | POA: Diagnosis not present

## 2021-06-05 DIAGNOSIS — M0689 Other specified rheumatoid arthritis, multiple sites: Secondary | ICD-10-CM | POA: Diagnosis not present

## 2021-06-05 DIAGNOSIS — M24572 Contracture, left ankle: Secondary | ICD-10-CM | POA: Diagnosis not present

## 2021-06-05 DIAGNOSIS — R278 Other lack of coordination: Secondary | ICD-10-CM | POA: Diagnosis not present

## 2021-06-05 DIAGNOSIS — M24571 Contracture, right ankle: Secondary | ICD-10-CM | POA: Diagnosis not present

## 2021-06-05 DIAGNOSIS — R293 Abnormal posture: Secondary | ICD-10-CM | POA: Diagnosis not present

## 2021-06-05 DIAGNOSIS — G928 Other toxic encephalopathy: Secondary | ICD-10-CM | POA: Diagnosis not present

## 2021-06-05 DIAGNOSIS — A419 Sepsis, unspecified organism: Secondary | ICD-10-CM | POA: Diagnosis not present

## 2021-06-05 DIAGNOSIS — N390 Urinary tract infection, site not specified: Secondary | ICD-10-CM | POA: Diagnosis not present

## 2021-06-05 DIAGNOSIS — Z515 Encounter for palliative care: Secondary | ICD-10-CM

## 2021-06-05 DIAGNOSIS — M24562 Contracture, left knee: Secondary | ICD-10-CM | POA: Diagnosis not present

## 2021-06-05 NOTE — Progress Notes (Signed)
[facility] RN NOTE  PATIENT NAME: [name] DOB: 05/07/1933 MRN: [id number]  PRIMARY CARE PROVIDER: Cassandria Anger, MD  RESPONSIBLE PARTY: Darlina Sicilian (daughtr) Acct ID - Guarantor Home Phone Work Phone Relationship Acct Type  [id number] AMZIE, [phone]  Self P/F     [address], [place], [place] [address]   Covid-19 Pre-screening Negative  PLAN OF CARE and INTERVENTION:  ADVANCE CARE PLANNING/GOALS OF CARE: Goal is for patient to get stronger and be able to sit up out of bed.  PATIENT/CAREGIVER EDUCATION: Symptom management, pain management DISEASE STATUS: Met with patient and her daughter, [name], in patient's room. She has been transferred to room 205 within the facility, [facility]. Patient is experiencing arthritic type pain in her left knee and also rectal pain from hemorrhoids. I made staff aware of patient's concerns. They have been applying Voltaren gel to help. [name] advised that her Orthopedic doctor ordered Humira for her arthritis and she has this at her home, but the facility has their own protocols to follow and working on this. [name] is hopeful this will help with her pain in order to maximize her therapy. She has a total of 20 sessions and [name] wants these to be spread out over several weeks, not used up quickly. Respirations are even, regular and unlabored. She is much more alert today and communicative than noted 2 weeks ago when she was discharged from the hospital due to a UTI. She does have some mild confusion, but able to make her needs known and answer questions. [name] says that patient has been having some visual hallucinations, she feels may possibly be due to Celexa and is currently being weaned off. [name] says she had patient placed on the list to see the facility Optometrist and Dentist. She is total care with all ADLs. Facility waiting on therapy to tell them when it is ok to start getting her up to a  wheelchair or recliner. She has a sacral wound being treated by the facility. Her appetite is improving. She is also drinking Glucerna and MedPass for nutritional supplementation. Will continue to monitor.   HISTORY OF PRESENT ILLNESS:  This is a 87 yo female with a history of CAD, CHF, TIA, hypertension, hyperlipidemia, DM II (diet controlled), CKD stage 3, depression, seizure disorder and GERD. Palliative care team asked to follow patient for goals of care and complex decision making.   CODE STATUS: DNR ADVANCED DIRECTIVES: Y MOST FORM: no PPS: 30%   PHYSICAL EXAM:   LUNGS: clear to auscultation  CARDIAC: Cor RRR EXTREMITIES: Some swelling noted to her left knee SKIN:  Exposed skin is dry and intact, sacral wound being treated by facility   NEURO:  Alert and oriented to person, trying to get acclimated to new room, mild confusion, total care, bedbound   (Duration of visit and documentation 75 minutes)   Daryl Eastern, RN BSN

## 2021-06-06 DIAGNOSIS — R293 Abnormal posture: Secondary | ICD-10-CM | POA: Diagnosis not present

## 2021-06-06 DIAGNOSIS — G928 Other toxic encephalopathy: Secondary | ICD-10-CM | POA: Diagnosis not present

## 2021-06-06 DIAGNOSIS — M24572 Contracture, left ankle: Secondary | ICD-10-CM | POA: Diagnosis not present

## 2021-06-06 DIAGNOSIS — M24571 Contracture, right ankle: Secondary | ICD-10-CM | POA: Diagnosis not present

## 2021-06-06 DIAGNOSIS — A419 Sepsis, unspecified organism: Secondary | ICD-10-CM | POA: Diagnosis not present

## 2021-06-06 DIAGNOSIS — M24562 Contracture, left knee: Secondary | ICD-10-CM | POA: Diagnosis not present

## 2021-06-06 DIAGNOSIS — M24561 Contracture, right knee: Secondary | ICD-10-CM | POA: Diagnosis not present

## 2021-06-06 DIAGNOSIS — R278 Other lack of coordination: Secondary | ICD-10-CM | POA: Diagnosis not present

## 2021-06-06 DIAGNOSIS — I10 Essential (primary) hypertension: Secondary | ICD-10-CM | POA: Diagnosis not present

## 2021-06-06 DIAGNOSIS — R4182 Altered mental status, unspecified: Secondary | ICD-10-CM | POA: Diagnosis not present

## 2021-06-07 DIAGNOSIS — R278 Other lack of coordination: Secondary | ICD-10-CM | POA: Diagnosis not present

## 2021-06-07 DIAGNOSIS — R293 Abnormal posture: Secondary | ICD-10-CM | POA: Diagnosis not present

## 2021-06-07 DIAGNOSIS — M24571 Contracture, right ankle: Secondary | ICD-10-CM | POA: Diagnosis not present

## 2021-06-07 DIAGNOSIS — M24561 Contracture, right knee: Secondary | ICD-10-CM | POA: Diagnosis not present

## 2021-06-07 DIAGNOSIS — M24562 Contracture, left knee: Secondary | ICD-10-CM | POA: Diagnosis not present

## 2021-06-07 DIAGNOSIS — R4182 Altered mental status, unspecified: Secondary | ICD-10-CM | POA: Diagnosis not present

## 2021-06-07 DIAGNOSIS — G928 Other toxic encephalopathy: Secondary | ICD-10-CM | POA: Diagnosis not present

## 2021-06-07 DIAGNOSIS — M24572 Contracture, left ankle: Secondary | ICD-10-CM | POA: Diagnosis not present

## 2021-06-07 DIAGNOSIS — A419 Sepsis, unspecified organism: Secondary | ICD-10-CM | POA: Diagnosis not present

## 2021-06-08 DIAGNOSIS — A419 Sepsis, unspecified organism: Secondary | ICD-10-CM | POA: Diagnosis not present

## 2021-06-08 DIAGNOSIS — M24561 Contracture, right knee: Secondary | ICD-10-CM | POA: Diagnosis not present

## 2021-06-08 DIAGNOSIS — M24571 Contracture, right ankle: Secondary | ICD-10-CM | POA: Diagnosis not present

## 2021-06-08 DIAGNOSIS — R278 Other lack of coordination: Secondary | ICD-10-CM | POA: Diagnosis not present

## 2021-06-08 DIAGNOSIS — M24562 Contracture, left knee: Secondary | ICD-10-CM | POA: Diagnosis not present

## 2021-06-08 DIAGNOSIS — R293 Abnormal posture: Secondary | ICD-10-CM | POA: Diagnosis not present

## 2021-06-08 DIAGNOSIS — G928 Other toxic encephalopathy: Secondary | ICD-10-CM | POA: Diagnosis not present

## 2021-06-08 DIAGNOSIS — R4182 Altered mental status, unspecified: Secondary | ICD-10-CM | POA: Diagnosis not present

## 2021-06-08 DIAGNOSIS — M24572 Contracture, left ankle: Secondary | ICD-10-CM | POA: Diagnosis not present

## 2021-06-09 DIAGNOSIS — M24561 Contracture, right knee: Secondary | ICD-10-CM | POA: Diagnosis not present

## 2021-06-09 DIAGNOSIS — R4182 Altered mental status, unspecified: Secondary | ICD-10-CM | POA: Diagnosis not present

## 2021-06-09 DIAGNOSIS — K649 Unspecified hemorrhoids: Secondary | ICD-10-CM | POA: Diagnosis not present

## 2021-06-09 DIAGNOSIS — M24571 Contracture, right ankle: Secondary | ICD-10-CM | POA: Diagnosis not present

## 2021-06-09 DIAGNOSIS — M24572 Contracture, left ankle: Secondary | ICD-10-CM | POA: Diagnosis not present

## 2021-06-09 DIAGNOSIS — L89159 Pressure ulcer of sacral region, unspecified stage: Secondary | ICD-10-CM | POA: Diagnosis not present

## 2021-06-09 DIAGNOSIS — G928 Other toxic encephalopathy: Secondary | ICD-10-CM | POA: Diagnosis not present

## 2021-06-09 DIAGNOSIS — R278 Other lack of coordination: Secondary | ICD-10-CM | POA: Diagnosis not present

## 2021-06-09 DIAGNOSIS — Z515 Encounter for palliative care: Secondary | ICD-10-CM | POA: Diagnosis not present

## 2021-06-09 DIAGNOSIS — R1312 Dysphagia, oropharyngeal phase: Secondary | ICD-10-CM | POA: Diagnosis not present

## 2021-06-09 DIAGNOSIS — A419 Sepsis, unspecified organism: Secondary | ICD-10-CM | POA: Diagnosis not present

## 2021-06-09 DIAGNOSIS — R293 Abnormal posture: Secondary | ICD-10-CM | POA: Diagnosis not present

## 2021-06-09 DIAGNOSIS — F3341 Major depressive disorder, recurrent, in partial remission: Secondary | ICD-10-CM | POA: Diagnosis not present

## 2021-06-09 DIAGNOSIS — M24562 Contracture, left knee: Secondary | ICD-10-CM | POA: Diagnosis not present

## 2021-06-12 DIAGNOSIS — F331 Major depressive disorder, recurrent, moderate: Secondary | ICD-10-CM | POA: Diagnosis not present

## 2021-06-12 DIAGNOSIS — R410 Disorientation, unspecified: Secondary | ICD-10-CM | POA: Diagnosis not present

## 2021-06-12 DIAGNOSIS — L603 Nail dystrophy: Secondary | ICD-10-CM | POA: Diagnosis not present

## 2021-06-12 DIAGNOSIS — E1140 Type 2 diabetes mellitus with diabetic neuropathy, unspecified: Secondary | ICD-10-CM | POA: Diagnosis not present

## 2021-06-12 DIAGNOSIS — F411 Generalized anxiety disorder: Secondary | ICD-10-CM | POA: Diagnosis not present

## 2021-06-12 DIAGNOSIS — B351 Tinea unguium: Secondary | ICD-10-CM | POA: Diagnosis not present

## 2021-06-13 DIAGNOSIS — L89153 Pressure ulcer of sacral region, stage 3: Secondary | ICD-10-CM | POA: Diagnosis not present

## 2021-06-14 DIAGNOSIS — M25662 Stiffness of left knee, not elsewhere classified: Secondary | ICD-10-CM | POA: Diagnosis not present

## 2021-06-14 DIAGNOSIS — M24562 Contracture, left knee: Secondary | ICD-10-CM | POA: Diagnosis not present

## 2021-06-14 DIAGNOSIS — R4182 Altered mental status, unspecified: Secondary | ICD-10-CM | POA: Diagnosis not present

## 2021-06-14 DIAGNOSIS — M069 Rheumatoid arthritis, unspecified: Secondary | ICD-10-CM | POA: Diagnosis not present

## 2021-06-14 DIAGNOSIS — J690 Pneumonitis due to inhalation of food and vomit: Secondary | ICD-10-CM | POA: Diagnosis not present

## 2021-06-14 DIAGNOSIS — G928 Other toxic encephalopathy: Secondary | ICD-10-CM | POA: Diagnosis not present

## 2021-06-14 DIAGNOSIS — M25661 Stiffness of right knee, not elsewhere classified: Secondary | ICD-10-CM | POA: Diagnosis not present

## 2021-06-14 DIAGNOSIS — R1312 Dysphagia, oropharyngeal phase: Secondary | ICD-10-CM | POA: Diagnosis not present

## 2021-06-14 DIAGNOSIS — T25221S Burn of second degree of right foot, sequela: Secondary | ICD-10-CM | POA: Diagnosis not present

## 2021-06-16 DIAGNOSIS — M069 Rheumatoid arthritis, unspecified: Secondary | ICD-10-CM | POA: Diagnosis not present

## 2021-06-16 DIAGNOSIS — G40909 Epilepsy, unspecified, not intractable, without status epilepticus: Secondary | ICD-10-CM | POA: Diagnosis not present

## 2021-06-16 DIAGNOSIS — R1310 Dysphagia, unspecified: Secondary | ICD-10-CM | POA: Diagnosis not present

## 2021-06-16 DIAGNOSIS — I5032 Chronic diastolic (congestive) heart failure: Secondary | ICD-10-CM | POA: Diagnosis not present

## 2021-06-16 DIAGNOSIS — G9341 Metabolic encephalopathy: Secondary | ICD-10-CM | POA: Diagnosis not present

## 2021-06-16 DIAGNOSIS — D649 Anemia, unspecified: Secondary | ICD-10-CM | POA: Diagnosis not present

## 2021-06-16 DIAGNOSIS — N390 Urinary tract infection, site not specified: Secondary | ICD-10-CM | POA: Diagnosis not present

## 2021-06-19 DIAGNOSIS — M069 Rheumatoid arthritis, unspecified: Secondary | ICD-10-CM | POA: Diagnosis not present

## 2021-06-19 DIAGNOSIS — R4182 Altered mental status, unspecified: Secondary | ICD-10-CM | POA: Diagnosis not present

## 2021-06-19 DIAGNOSIS — G928 Other toxic encephalopathy: Secondary | ICD-10-CM | POA: Diagnosis not present

## 2021-06-19 DIAGNOSIS — M24562 Contracture, left knee: Secondary | ICD-10-CM | POA: Diagnosis not present

## 2021-06-19 DIAGNOSIS — T25221S Burn of second degree of right foot, sequela: Secondary | ICD-10-CM | POA: Diagnosis not present

## 2021-06-19 DIAGNOSIS — R1312 Dysphagia, oropharyngeal phase: Secondary | ICD-10-CM | POA: Diagnosis not present

## 2021-06-19 DIAGNOSIS — M25662 Stiffness of left knee, not elsewhere classified: Secondary | ICD-10-CM | POA: Diagnosis not present

## 2021-06-19 DIAGNOSIS — J690 Pneumonitis due to inhalation of food and vomit: Secondary | ICD-10-CM | POA: Diagnosis not present

## 2021-06-19 DIAGNOSIS — M25661 Stiffness of right knee, not elsewhere classified: Secondary | ICD-10-CM | POA: Diagnosis not present

## 2021-06-20 DIAGNOSIS — L89153 Pressure ulcer of sacral region, stage 3: Secondary | ICD-10-CM | POA: Diagnosis not present

## 2021-06-20 DIAGNOSIS — G928 Other toxic encephalopathy: Secondary | ICD-10-CM | POA: Diagnosis not present

## 2021-06-20 DIAGNOSIS — M24562 Contracture, left knee: Secondary | ICD-10-CM | POA: Diagnosis not present

## 2021-06-20 DIAGNOSIS — R4182 Altered mental status, unspecified: Secondary | ICD-10-CM | POA: Diagnosis not present

## 2021-06-20 DIAGNOSIS — M25662 Stiffness of left knee, not elsewhere classified: Secondary | ICD-10-CM | POA: Diagnosis not present

## 2021-06-20 DIAGNOSIS — J690 Pneumonitis due to inhalation of food and vomit: Secondary | ICD-10-CM | POA: Diagnosis not present

## 2021-06-20 DIAGNOSIS — M069 Rheumatoid arthritis, unspecified: Secondary | ICD-10-CM | POA: Diagnosis not present

## 2021-06-20 DIAGNOSIS — M25661 Stiffness of right knee, not elsewhere classified: Secondary | ICD-10-CM | POA: Diagnosis not present

## 2021-06-20 DIAGNOSIS — T25221S Burn of second degree of right foot, sequela: Secondary | ICD-10-CM | POA: Diagnosis not present

## 2021-06-20 DIAGNOSIS — R1312 Dysphagia, oropharyngeal phase: Secondary | ICD-10-CM | POA: Diagnosis not present

## 2021-06-21 DIAGNOSIS — J690 Pneumonitis due to inhalation of food and vomit: Secondary | ICD-10-CM | POA: Diagnosis not present

## 2021-06-21 DIAGNOSIS — M24562 Contracture, left knee: Secondary | ICD-10-CM | POA: Diagnosis not present

## 2021-06-21 DIAGNOSIS — M25662 Stiffness of left knee, not elsewhere classified: Secondary | ICD-10-CM | POA: Diagnosis not present

## 2021-06-21 DIAGNOSIS — R1312 Dysphagia, oropharyngeal phase: Secondary | ICD-10-CM | POA: Diagnosis not present

## 2021-06-21 DIAGNOSIS — T25221S Burn of second degree of right foot, sequela: Secondary | ICD-10-CM | POA: Diagnosis not present

## 2021-06-21 DIAGNOSIS — G928 Other toxic encephalopathy: Secondary | ICD-10-CM | POA: Diagnosis not present

## 2021-06-21 DIAGNOSIS — R4182 Altered mental status, unspecified: Secondary | ICD-10-CM | POA: Diagnosis not present

## 2021-06-21 DIAGNOSIS — M25661 Stiffness of right knee, not elsewhere classified: Secondary | ICD-10-CM | POA: Diagnosis not present

## 2021-06-21 DIAGNOSIS — M069 Rheumatoid arthritis, unspecified: Secondary | ICD-10-CM | POA: Diagnosis not present

## 2021-06-22 DIAGNOSIS — M25662 Stiffness of left knee, not elsewhere classified: Secondary | ICD-10-CM | POA: Diagnosis not present

## 2021-06-22 DIAGNOSIS — M24562 Contracture, left knee: Secondary | ICD-10-CM | POA: Diagnosis not present

## 2021-06-22 DIAGNOSIS — R1312 Dysphagia, oropharyngeal phase: Secondary | ICD-10-CM | POA: Diagnosis not present

## 2021-06-22 DIAGNOSIS — M069 Rheumatoid arthritis, unspecified: Secondary | ICD-10-CM | POA: Diagnosis not present

## 2021-06-22 DIAGNOSIS — J690 Pneumonitis due to inhalation of food and vomit: Secondary | ICD-10-CM | POA: Diagnosis not present

## 2021-06-22 DIAGNOSIS — G928 Other toxic encephalopathy: Secondary | ICD-10-CM | POA: Diagnosis not present

## 2021-06-22 DIAGNOSIS — R4182 Altered mental status, unspecified: Secondary | ICD-10-CM | POA: Diagnosis not present

## 2021-06-22 DIAGNOSIS — T25221S Burn of second degree of right foot, sequela: Secondary | ICD-10-CM | POA: Diagnosis not present

## 2021-06-22 DIAGNOSIS — M25661 Stiffness of right knee, not elsewhere classified: Secondary | ICD-10-CM | POA: Diagnosis not present

## 2021-06-23 DIAGNOSIS — M24562 Contracture, left knee: Secondary | ICD-10-CM | POA: Diagnosis not present

## 2021-06-23 DIAGNOSIS — R1312 Dysphagia, oropharyngeal phase: Secondary | ICD-10-CM | POA: Diagnosis not present

## 2021-06-23 DIAGNOSIS — T25221S Burn of second degree of right foot, sequela: Secondary | ICD-10-CM | POA: Diagnosis not present

## 2021-06-23 DIAGNOSIS — R4182 Altered mental status, unspecified: Secondary | ICD-10-CM | POA: Diagnosis not present

## 2021-06-23 DIAGNOSIS — G928 Other toxic encephalopathy: Secondary | ICD-10-CM | POA: Diagnosis not present

## 2021-06-23 DIAGNOSIS — J690 Pneumonitis due to inhalation of food and vomit: Secondary | ICD-10-CM | POA: Diagnosis not present

## 2021-06-23 DIAGNOSIS — M25662 Stiffness of left knee, not elsewhere classified: Secondary | ICD-10-CM | POA: Diagnosis not present

## 2021-06-23 DIAGNOSIS — M069 Rheumatoid arthritis, unspecified: Secondary | ICD-10-CM | POA: Diagnosis not present

## 2021-06-23 DIAGNOSIS — M25661 Stiffness of right knee, not elsewhere classified: Secondary | ICD-10-CM | POA: Diagnosis not present

## 2021-06-26 DIAGNOSIS — G928 Other toxic encephalopathy: Secondary | ICD-10-CM | POA: Diagnosis not present

## 2021-06-26 DIAGNOSIS — M25661 Stiffness of right knee, not elsewhere classified: Secondary | ICD-10-CM | POA: Diagnosis not present

## 2021-06-26 DIAGNOSIS — J690 Pneumonitis due to inhalation of food and vomit: Secondary | ICD-10-CM | POA: Diagnosis not present

## 2021-06-26 DIAGNOSIS — M069 Rheumatoid arthritis, unspecified: Secondary | ICD-10-CM | POA: Diagnosis not present

## 2021-06-26 DIAGNOSIS — R4182 Altered mental status, unspecified: Secondary | ICD-10-CM | POA: Diagnosis not present

## 2021-06-26 DIAGNOSIS — M25662 Stiffness of left knee, not elsewhere classified: Secondary | ICD-10-CM | POA: Diagnosis not present

## 2021-06-26 DIAGNOSIS — M24562 Contracture, left knee: Secondary | ICD-10-CM | POA: Diagnosis not present

## 2021-06-26 DIAGNOSIS — R1312 Dysphagia, oropharyngeal phase: Secondary | ICD-10-CM | POA: Diagnosis not present

## 2021-06-26 DIAGNOSIS — T25221S Burn of second degree of right foot, sequela: Secondary | ICD-10-CM | POA: Diagnosis not present

## 2021-06-27 DIAGNOSIS — L89153 Pressure ulcer of sacral region, stage 3: Secondary | ICD-10-CM | POA: Diagnosis not present

## 2021-06-29 ENCOUNTER — Emergency Department (HOSPITAL_COMMUNITY): Payer: Medicare HMO

## 2021-06-29 ENCOUNTER — Encounter (HOSPITAL_COMMUNITY): Payer: Self-pay

## 2021-06-29 ENCOUNTER — Other Ambulatory Visit: Payer: Self-pay

## 2021-06-29 ENCOUNTER — Inpatient Hospital Stay (HOSPITAL_COMMUNITY)
Admission: EM | Admit: 2021-06-29 | Discharge: 2021-07-03 | DRG: 689 | Disposition: A | Discharge status: Skilled Nursing Facility | Payer: Medicare HMO | Attending: Internal Medicine | Admitting: Internal Medicine

## 2021-06-29 DIAGNOSIS — G9341 Metabolic encephalopathy: Secondary | ICD-10-CM | POA: Diagnosis not present

## 2021-06-29 DIAGNOSIS — E785 Hyperlipidemia, unspecified: Secondary | ICD-10-CM | POA: Diagnosis not present

## 2021-06-29 DIAGNOSIS — R0902 Hypoxemia: Secondary | ICD-10-CM | POA: Diagnosis not present

## 2021-06-29 DIAGNOSIS — Z8673 Personal history of transient ischemic attack (TIA), and cerebral infarction without residual deficits: Secondary | ICD-10-CM

## 2021-06-29 DIAGNOSIS — M109 Gout, unspecified: Secondary | ICD-10-CM | POA: Diagnosis present

## 2021-06-29 DIAGNOSIS — N390 Urinary tract infection, site not specified: Principal | ICD-10-CM

## 2021-06-29 DIAGNOSIS — R4189 Other symptoms and signs involving cognitive functions and awareness: Secondary | ICD-10-CM | POA: Diagnosis not present

## 2021-06-29 DIAGNOSIS — Z20822 Contact with and (suspected) exposure to covid-19: Secondary | ICD-10-CM | POA: Diagnosis not present

## 2021-06-29 DIAGNOSIS — L8990 Pressure ulcer of unspecified site, unspecified stage: Secondary | ICD-10-CM | POA: Diagnosis present

## 2021-06-29 DIAGNOSIS — Z7401 Bed confinement status: Secondary | ICD-10-CM | POA: Diagnosis not present

## 2021-06-29 DIAGNOSIS — M353 Polymyalgia rheumatica: Secondary | ICD-10-CM | POA: Diagnosis present

## 2021-06-29 DIAGNOSIS — I5032 Chronic diastolic (congestive) heart failure: Secondary | ICD-10-CM | POA: Diagnosis present

## 2021-06-29 DIAGNOSIS — Z8616 Personal history of COVID-19: Secondary | ICD-10-CM | POA: Diagnosis not present

## 2021-06-29 DIAGNOSIS — B9689 Other specified bacterial agents as the cause of diseases classified elsewhere: Secondary | ICD-10-CM | POA: Diagnosis present

## 2021-06-29 DIAGNOSIS — F0390 Unspecified dementia without behavioral disturbance: Secondary | ICD-10-CM | POA: Diagnosis present

## 2021-06-29 DIAGNOSIS — Z9851 Tubal ligation status: Secondary | ICD-10-CM

## 2021-06-29 DIAGNOSIS — E559 Vitamin D deficiency, unspecified: Secondary | ICD-10-CM | POA: Diagnosis present

## 2021-06-29 DIAGNOSIS — I2510 Atherosclerotic heart disease of native coronary artery without angina pectoris: Secondary | ICD-10-CM | POA: Diagnosis present

## 2021-06-29 DIAGNOSIS — E538 Deficiency of other specified B group vitamins: Secondary | ICD-10-CM | POA: Diagnosis present

## 2021-06-29 DIAGNOSIS — Z8249 Family history of ischemic heart disease and other diseases of the circulatory system: Secondary | ICD-10-CM

## 2021-06-29 DIAGNOSIS — Z955 Presence of coronary angioplasty implant and graft: Secondary | ICD-10-CM

## 2021-06-29 DIAGNOSIS — I130 Hypertensive heart and chronic kidney disease with heart failure and stage 1 through stage 4 chronic kidney disease, or unspecified chronic kidney disease: Secondary | ICD-10-CM | POA: Diagnosis present

## 2021-06-29 DIAGNOSIS — R410 Disorientation, unspecified: Secondary | ICD-10-CM | POA: Diagnosis not present

## 2021-06-29 DIAGNOSIS — Z9049 Acquired absence of other specified parts of digestive tract: Secondary | ICD-10-CM

## 2021-06-29 DIAGNOSIS — D539 Nutritional anemia, unspecified: Secondary | ICD-10-CM | POA: Diagnosis present

## 2021-06-29 DIAGNOSIS — M069 Rheumatoid arthritis, unspecified: Secondary | ICD-10-CM | POA: Diagnosis present

## 2021-06-29 DIAGNOSIS — L89152 Pressure ulcer of sacral region, stage 2: Secondary | ICD-10-CM | POA: Diagnosis present

## 2021-06-29 DIAGNOSIS — N183 Chronic kidney disease, stage 3 unspecified: Secondary | ICD-10-CM | POA: Diagnosis present

## 2021-06-29 DIAGNOSIS — R001 Bradycardia, unspecified: Secondary | ICD-10-CM | POA: Diagnosis not present

## 2021-06-29 DIAGNOSIS — Z888 Allergy status to other drugs, medicaments and biological substances status: Secondary | ICD-10-CM

## 2021-06-29 DIAGNOSIS — F419 Anxiety disorder, unspecified: Secondary | ICD-10-CM | POA: Diagnosis present

## 2021-06-29 DIAGNOSIS — G40909 Epilepsy, unspecified, not intractable, without status epilepticus: Secondary | ICD-10-CM | POA: Diagnosis not present

## 2021-06-29 DIAGNOSIS — F32A Depression, unspecified: Secondary | ICD-10-CM | POA: Diagnosis present

## 2021-06-29 DIAGNOSIS — E11649 Type 2 diabetes mellitus with hypoglycemia without coma: Secondary | ICD-10-CM | POA: Diagnosis not present

## 2021-06-29 DIAGNOSIS — Z794 Long term (current) use of insulin: Secondary | ICD-10-CM

## 2021-06-29 DIAGNOSIS — Z66 Do not resuscitate: Secondary | ICD-10-CM | POA: Diagnosis not present

## 2021-06-29 DIAGNOSIS — R4182 Altered mental status, unspecified: Secondary | ICD-10-CM | POA: Diagnosis not present

## 2021-06-29 DIAGNOSIS — R569 Unspecified convulsions: Secondary | ICD-10-CM | POA: Diagnosis not present

## 2021-06-29 DIAGNOSIS — R404 Transient alteration of awareness: Secondary | ICD-10-CM | POA: Diagnosis not present

## 2021-06-29 DIAGNOSIS — R000 Tachycardia, unspecified: Secondary | ICD-10-CM | POA: Diagnosis not present

## 2021-06-29 DIAGNOSIS — Z880 Allergy status to penicillin: Secondary | ICD-10-CM

## 2021-06-29 DIAGNOSIS — G8929 Other chronic pain: Secondary | ICD-10-CM | POA: Diagnosis present

## 2021-06-29 DIAGNOSIS — M2550 Pain in unspecified joint: Secondary | ICD-10-CM | POA: Diagnosis not present

## 2021-06-29 DIAGNOSIS — Z833 Family history of diabetes mellitus: Secondary | ICD-10-CM

## 2021-06-29 DIAGNOSIS — Z79899 Other long term (current) drug therapy: Secondary | ICD-10-CM

## 2021-06-29 DIAGNOSIS — Z7982 Long term (current) use of aspirin: Secondary | ICD-10-CM

## 2021-06-29 DIAGNOSIS — Z90710 Acquired absence of both cervix and uterus: Secondary | ICD-10-CM

## 2021-06-29 DIAGNOSIS — I252 Old myocardial infarction: Secondary | ICD-10-CM

## 2021-06-29 DIAGNOSIS — Z87440 Personal history of urinary (tract) infections: Secondary | ICD-10-CM

## 2021-06-29 DIAGNOSIS — Z9102 Food additives allergy status: Secondary | ICD-10-CM

## 2021-06-29 DIAGNOSIS — M810 Age-related osteoporosis without current pathological fracture: Secondary | ICD-10-CM | POA: Diagnosis present

## 2021-06-29 DIAGNOSIS — K219 Gastro-esophageal reflux disease without esophagitis: Secondary | ICD-10-CM | POA: Diagnosis present

## 2021-06-29 DIAGNOSIS — B952 Enterococcus as the cause of diseases classified elsewhere: Secondary | ICD-10-CM | POA: Diagnosis present

## 2021-06-29 DIAGNOSIS — R531 Weakness: Secondary | ICD-10-CM | POA: Diagnosis not present

## 2021-06-29 DIAGNOSIS — K5900 Constipation, unspecified: Secondary | ICD-10-CM | POA: Diagnosis present

## 2021-06-29 DIAGNOSIS — Z885 Allergy status to narcotic agent status: Secondary | ICD-10-CM

## 2021-06-29 DIAGNOSIS — R9431 Abnormal electrocardiogram [ECG] [EKG]: Secondary | ICD-10-CM | POA: Diagnosis not present

## 2021-06-29 LAB — CBC WITH DIFFERENTIAL/PLATELET
Abs Immature Granulocytes: 0.05 10*3/uL (ref 0.00–0.07)
Basophils Absolute: 0 10*3/uL (ref 0.0–0.1)
Basophils Relative: 0 %
Eosinophils Absolute: 0.2 10*3/uL (ref 0.0–0.5)
Eosinophils Relative: 2 %
HCT: 29.3 % — ABNORMAL LOW (ref 36.0–46.0)
Hemoglobin: 9.2 g/dL — ABNORMAL LOW (ref 12.0–15.0)
Immature Granulocytes: 1 %
Lymphocytes Relative: 22 %
Lymphs Abs: 1.8 10*3/uL (ref 0.7–4.0)
MCH: 33.3 pg (ref 26.0–34.0)
MCHC: 31.4 g/dL (ref 30.0–36.0)
MCV: 106.2 fL — ABNORMAL HIGH (ref 80.0–100.0)
Monocytes Absolute: 0.5 10*3/uL (ref 0.1–1.0)
Monocytes Relative: 6 %
Neutro Abs: 5.7 10*3/uL (ref 1.7–7.7)
Neutrophils Relative %: 69 %
Platelets: 228 10*3/uL (ref 150–400)
RBC: 2.76 MIL/uL — ABNORMAL LOW (ref 3.87–5.11)
RDW: 14.3 % (ref 11.5–15.5)
WBC: 8.2 10*3/uL (ref 4.0–10.5)
nRBC: 0 % (ref 0.0–0.2)

## 2021-06-29 LAB — COMPREHENSIVE METABOLIC PANEL
ALT: 8 U/L (ref 0–44)
AST: 23 U/L (ref 15–41)
Albumin: 2.8 g/dL — ABNORMAL LOW (ref 3.5–5.0)
Alkaline Phosphatase: 51 U/L (ref 38–126)
Anion gap: 7 (ref 5–15)
BUN: 18 mg/dL (ref 8–23)
CO2: 25 mmol/L (ref 22–32)
Calcium: 8.7 mg/dL — ABNORMAL LOW (ref 8.9–10.3)
Chloride: 107 mmol/L (ref 98–111)
Creatinine, Ser: 0.7 mg/dL (ref 0.44–1.00)
GFR, Estimated: >60 mL/min (ref >60)
Glucose, Bld: 90 mg/dL (ref 70–99)
Potassium: 4.5 mmol/L (ref 3.5–5.1)
Sodium: 139 mmol/L (ref 135–145)
Total Bilirubin: 0.5 mg/dL (ref 0.3–1.2)
Total Protein: 6.9 g/dL (ref 6.5–8.1)

## 2021-06-29 LAB — URINALYSIS, MICROSCOPIC (REFLEX)
Bacteria, UA: NONE SEEN
RBC / HPF: NONE SEEN RBC/hpf (ref 0–5)

## 2021-06-29 LAB — BLOOD GAS, ARTERIAL
Acid-Base Excess: 0.6 mmol/L (ref 0.0–2.0)
Bicarbonate: 24.1 mmol/L (ref 20.0–28.0)
Drawn by: 27027
FIO2: 21
O2 Saturation: 98.1 %
Patient temperature: 98.6
pCO2 arterial: 36.4 mmHg (ref 32.0–48.0)
pH, Arterial: 7.437 (ref 7.350–7.450)
pO2, Arterial: 117 mmHg — ABNORMAL HIGH (ref 83.0–108.0)

## 2021-06-29 LAB — RESP PANEL BY RT-PCR (FLU A&B, COVID) ARPGX2
Influenza A by PCR: NEGATIVE
Influenza B by PCR: NEGATIVE
SARS Coronavirus 2 by RT PCR: NEGATIVE

## 2021-06-29 LAB — VALPROIC ACID LEVEL: Valproic Acid Lvl: 28 ug/mL — ABNORMAL LOW (ref 50.0–100.0)

## 2021-06-29 LAB — URINALYSIS, ROUTINE W REFLEX MICROSCOPIC
Bilirubin Urine: NEGATIVE
Glucose, UA: NEGATIVE mg/dL
Hgb urine dipstick: NEGATIVE
Ketones, ur: NEGATIVE mg/dL
Nitrite: NEGATIVE
Specific Gravity, Urine: 1.01 (ref 1.005–1.030)
pH: 8 (ref 5.0–8.0)

## 2021-06-29 LAB — BRAIN NATRIURETIC PEPTIDE: B Natriuretic Peptide: 239.9 pg/mL — ABNORMAL HIGH (ref 0.0–100.0)

## 2021-06-29 LAB — LACTIC ACID, PLASMA: Lactic Acid, Venous: 1.1 mmol/L (ref 0.5–1.9)

## 2021-06-29 MED ORDER — IBUPROFEN 800 MG PO TABS: 800.0000 mg | ORAL_TABLET | Freq: Three times a day (TID) | ORAL | 1 refills | Status: DC | PRN

## 2021-06-29 MED ORDER — LORAZEPAM 2 MG/ML IJ SOLN: 1.0000 mg | Freq: Once | INTRAMUSCULAR | Status: DC

## 2021-06-29 MED ORDER — SODIUM CHLORIDE 0.9 % IV BOLUS
500.0000 mL | Freq: Once | INTRAVENOUS | Status: AC
  Administered 2021-06-29: 500 mL via INTRAVENOUS

## 2021-06-29 MED ORDER — ONDANSETRON HCL 4 MG PO TABS: 4.0000 mg | ORAL_TABLET | Freq: Four times a day (QID) | ORAL | Status: DC | PRN

## 2021-06-29 MED ORDER — DULOXETINE HCL 20 MG PO CPEP
20.0000 mg | ORAL_CAPSULE | Freq: Every day | ORAL | Status: DC
  Administered 2021-07-01 – 2021-07-02 (×2): 20 mg via ORAL
  Filled 2021-06-29 (×4): qty 1

## 2021-06-29 MED ORDER — POLYETHYLENE GLYCOL 3350 17 G PO PACK: 17.0000 g | PACK | ORAL | Status: DC

## 2021-06-29 MED ORDER — THIAMINE HCL 100 MG PO TABS
100.0000 mg | ORAL_TABLET | Freq: Every day | ORAL | Status: DC
  Filled 2021-06-29: qty 1

## 2021-06-29 MED ORDER — DEXTROSE-NACL 5-0.45 % IV SOLN: INTRAVENOUS | Status: DC

## 2021-06-29 MED ORDER — ONDANSETRON HCL 4 MG/2ML IJ SOLN: 4.0000 mg | Freq: Four times a day (QID) | INTRAMUSCULAR | Status: DC | PRN

## 2021-06-29 MED ORDER — VALPROATE SODIUM 100 MG/ML IV SOLN
1000.0000 mg | Freq: Once | INTRAVENOUS | Status: AC
  Administered 2021-06-29: 1000 mg via INTRAVENOUS
  Filled 2021-06-29: qty 10

## 2021-06-29 MED ORDER — LEVETIRACETAM IN NACL 1000 MG/100ML IV SOLN
1000.0000 mg | Freq: Once | INTRAVENOUS | Status: AC
  Administered 2021-06-29: 1000 mg via INTRAVENOUS
  Filled 2021-06-29: qty 100

## 2021-06-29 MED ORDER — PANTOPRAZOLE SODIUM 40 MG IV SOLR
40.0000 mg | Freq: Once | INTRAVENOUS | Status: AC
  Administered 2021-06-29: 40 mg via INTRAVENOUS
  Filled 2021-06-29: qty 40

## 2021-06-29 MED ORDER — SULFAMETHOXAZOLE-TRIMETHOPRIM 800-160 MG PO TABS: 1.0000 | ORAL_TABLET | Freq: Two times a day (BID) | ORAL | 0 refills | Status: DC

## 2021-06-29 MED ORDER — ASPIRIN EC 81 MG PO TBEC
81.0000 mg | DELAYED_RELEASE_TABLET | Freq: Every day | ORAL | Status: DC
  Administered 2021-07-01 – 2021-07-03 (×3): 81 mg via ORAL
  Filled 2021-06-29 (×4): qty 1

## 2021-06-29 MED ORDER — SODIUM CHLORIDE 0.9 % IV SOLN
1.0000 g | INTRAVENOUS | Status: DC
  Administered 2021-06-29 – 2021-06-30 (×2): 1 g via INTRAVENOUS
  Filled 2021-06-29: qty 10
  Filled 2021-06-29: qty 1
  Filled 2021-06-29: qty 10

## 2021-06-29 MED ORDER — PANTOPRAZOLE SODIUM 40 MG PO TBEC
40.0000 mg | DELAYED_RELEASE_TABLET | Freq: Every day | ORAL | Status: DC
  Filled 2021-06-29: qty 1

## 2021-06-29 MED ORDER — CARVEDILOL 3.125 MG PO TABS: 3.1250 mg | ORAL_TABLET | Freq: Two times a day (BID) | ORAL | Status: DC

## 2021-06-29 MED ORDER — DIVALPROEX SODIUM 500 MG PO DR TAB: 750.0000 mg | DELAYED_RELEASE_TABLET | Freq: Two times a day (BID) | ORAL | Status: DC

## 2021-06-29 MED ORDER — THIAMINE HCL 100 MG/ML IJ SOLN
100.0000 mg | Freq: Every day | INTRAMUSCULAR | Status: DC
  Administered 2021-06-29 – 2021-07-02 (×4): 100 mg via INTRAVENOUS
  Filled 2021-06-29 (×4): qty 2

## 2021-06-29 MED ORDER — SENNOSIDES-DOCUSATE SODIUM 8.6-50 MG PO TABS
2.0000 | ORAL_TABLET | Freq: Two times a day (BID) | ORAL | Status: DC
  Administered 2021-07-01 – 2021-07-03 (×5): 2 via ORAL
  Filled 2021-06-29 (×5): qty 2

## 2021-06-29 MED ORDER — ACETAMINOPHEN 500 MG PO TABS
1000.0000 mg | ORAL_TABLET | Freq: Three times a day (TID) | ORAL | Status: DC
  Administered 2021-07-01 – 2021-07-03 (×7): 1000 mg via ORAL
  Filled 2021-06-29 (×8): qty 2

## 2021-06-29 MED ORDER — ROSUVASTATIN CALCIUM 10 MG PO TABS
10.0000 mg | ORAL_TABLET | Freq: Every day | ORAL | Status: DC
  Administered 2021-07-01 – 2021-07-02 (×2): 10 mg via ORAL
  Filled 2021-06-29 (×3): qty 1

## 2021-06-29 MED ORDER — ACETAMINOPHEN 650 MG RE SUPP
650.0000 mg | Freq: Three times a day (TID) | RECTAL | Status: DC | PRN
  Filled 2021-06-29: qty 1

## 2021-06-29 MED ORDER — POLYETHYLENE GLYCOL 3350 17 G PO PACK
17.0000 g | PACK | ORAL | Status: DC
  Administered 2021-07-02: 17 g via ORAL

## 2021-06-29 MED ORDER — LORAZEPAM 2 MG/ML IJ SOLN
1.0000 mg | Freq: Once | INTRAMUSCULAR | Status: AC
  Administered 2021-06-29: 1 mg via INTRAVENOUS
  Filled 2021-06-29: qty 1

## 2021-06-29 NOTE — ED Notes (Signed)
No response from [place] MD to previous communication regarding PO medication. Message sent to floor coverage MD in case changes are needed.

## 2021-06-29 NOTE — ED Provider Notes (Addendum)
[facility]-EMERGENCY DEPT Provider Note   CSN: [id number] Arrival date & time: 06/29/21  1057     History Chief Complaint  Patient presents with   Altered Mental Status    [name] is a 87 y.o. female.  The patient was brought to the emergency department for decreased mental status.  She was not talking as much as she usually does  The history is provided by the patient and a relative. No language interpreter was used.  Altered Mental Status Presenting symptoms: behavior changes   Severity:  Mild Most recent episode:  Today Episode history:  Continuous Duration:  5 hours Timing:  Constant Chronicity:  New Context: dementia   Associated symptoms: no abdominal pain, no hallucinations, no headaches, no rash and no seizures       Past Medical History:  Diagnosis Date   Anemia    iron deficiency   Aneurysm, thoracic aortic (HCC)    Anxiety    B12 deficiency    CAD (coronary artery disease)    s/p stenting of LAD 1999- cath 5-08 EF normal LAD 30-40% restenosis. D1 50% D2 80% LCX & RCA minimal plaque   Chronic back pain    Constipation    Depression    GERD (gastroesophageal reflux disease)    Gout    HTN (hypertension)    Hyperlipemia    Obesity    Osteoporosis    Pancreatitis    Polyarthritis    DJD/ possible PMR   Renal insufficiency    Cr 1.2-1.3   Seizures (HCC)    Tinnitus    Urinary frequency    Vertigo    Vitamin D deficiency     Patient Active Problem List   Diagnosis Date Noted   Anemia 03/31/2021   Palpitations 01/08/2021   AMS (altered mental status) 01/08/2021   History of seizure 01/08/2021   S/P primary angioplasty with coronary stent 01/08/2021   Chronic diastolic CHF (congestive heart failure) (HCC) 01/08/2021   CKD (chronic kidney disease) stage 3, GFR 30-59 ml/min (HCC) 08/29/2020   Depression 08/29/2020   COVID-19 virus infection 02/05/2020   Unstable angina (HCC) 10/16/2019   Non-ST elevation (NSTEMI)  myocardial infarction (HCC) 10/16/2019   Nausea 06/23/2018   CHF (congestive heart failure) (HCC) 04/03/2018   AKI (acute kidney injury) (HCC) 11/20/2017   Dehydration    Generalized weakness 11/18/2017   Second degree burn of foot 11/18/2017   Seizures (HCC) 07/17/2016   Hypertensive urgency 07/17/2016   Acute diastolic CHF (congestive heart failure) (HCC) 07/17/2016   UTI (urinary tract infection) 02/28/2016   Urinary incontinence 10/28/2015   Alopecia 08/12/2015   TIA (transient ischemic attack) 07/01/2015   Acute encephalopathy 06/24/2015   Protein-calorie malnutrition, severe (HCC) 04/19/2015   Fever    Abdominal abscess    Pressure ulcer 04/07/2015   Acute kidney injury (HCC)    Sepsis (HCC)    Stroke (HCC)    Facial droop    Confusion 03/26/2015   Malnutrition of moderate degree (HCC) 03/20/2015   Small bowel obstruction s/p exlap/LOA/decompression 03/25/2015 03/18/2015   Essential hypertension 03/18/2015   Noncompliance with medication regimen 12/07/2014   Fall against object 06/24/2014   Contusion of left hand 06/24/2014   Contusion of left hip 06/24/2014   Loss of weight 04/12/2014   Malignant hypertension with renal failure and congestive heart failure (HCC) 09/23/2013   Knee pain, bilateral 07/27/2013   Chronic fatigue disorder 01/19/2013   Vaginitis due to Candida 09/02/2012  Sinusitis 09/02/2012   Anemia in other chronic diseases classified elsewhere 09/02/2012   Polymyalgia rheumatica (HCC) 09/10/2011   Vertigo 08/21/2011   Fatigue 05/11/2011   ANEMIA OF CHRONIC DISEASE 09/06/2010   KNEE PAIN, CHRONIC 05/26/2010   DIZZINESS 05/11/2010   SHOULDER PAIN 04/25/2010   FREQUENCY, URINARY 03/16/2010   CONSTIPATION, CHRONIC 01/17/2010   FOOT PAIN 01/17/2009   Rash and other nonspecific skin eruption 01/17/2009   TINNITUS NOS 10/18/2008   B12 deficiency 08/10/2008   LOW BACK PAIN 06/23/2008   CHEST WALL PAIN 06/23/2008   Dysuria 03/18/2008   Abdominal pain  03/18/2008   Hyperlipidemia 12/24/2007   DYSPNEA 12/24/2007   Anxiety disorder 12/07/2007   GERD 12/07/2007   CHEST PAIN 12/07/2007   OTHER SPEC FORMS CHRONIC ISCHEMIC HEART DISEASE 12/01/2007   Type 2 diabetes mellitus with diabetic nephropathy, without long-term current use of insulin (HCC) 11/28/2007   Pain in joint 11/28/2007   Gout 09/17/2007   Adjustment disorder with mixed anxiety and depressed mood 09/17/2007   Coronary atherosclerosis 09/17/2007   Disorder resulting from impaired renal function 09/17/2007   OSTEOPOROSIS 09/17/2007   DVT, HX OF 09/17/2007   PANCREATITIS, HX OF 09/17/2007    Past Surgical History:  Procedure Laterality Date   ABDOMINAL HYSTERECTOMY     CARDIAC CATHETERIZATION  2008   L main 20%, LAD stent patent, D1 50%, D2 80% (small), RCA 20%, EF 55-60%   CHOLECYSTECTOMY     CORONARY ANGIOPLASTY WITH STENT PLACEMENT  1999   LAD stent   CORONARY STENT INTERVENTION N/A 10/16/2019   Procedure: CORONARY STENT INTERVENTION;  Surgeon: Marykay Lex, MD;  Location: [facility] INVASIVE CV LAB;  Service: Cardiovascular;  Laterality: N/A;   HEMORRHOID SURGERY     LAPAROSCOPIC LYSIS OF ADHESIONS N/A 03/24/2015   Procedure: LAPAROSCOPIC LYSIS OF ADHESIONS;  Surgeon: Luretha Murphy, MD;  Location: [facility] ORS;  Service: General;  Laterality: N/A;   LAPAROSCOPY N/A 03/24/2015   Procedure: LAPAROSCOPY DIAGNOSTIC;  Surgeon: Luretha Murphy, MD;  Location: [facility] ORS;  Service: General;  Laterality: N/A;   LAPAROTOMY N/A 03/24/2015   Procedure: LAPAROTOMY with decompression of bowel;  Surgeon: Luretha Murphy, MD;  Location: [facility] ORS;  Service: General;  Laterality: N/A;   LEFT HEART CATH AND CORONARY ANGIOGRAPHY N/A 10/16/2019   Procedure: LEFT HEART CATH AND CORONARY ANGIOGRAPHY;  Surgeon: Marykay Lex, MD;  Location: [facility] INVASIVE CV LAB;  Service: Cardiovascular;  Laterality: N/A;   TUBAL LIGATION       OB History   No obstetric history on file.     Family History  Adopted: Yes   Problem Relation Age of Onset   Diabetes Mother    Hypertension Father    Cancer Sister     Social History   Tobacco Use   Smoking status: Never   Smokeless tobacco: Never  Substance Use Topics   Alcohol use: No   Drug use: No    Home Medications Prior to Admission medications   Medication Sig Start Date End Date Taking? Authorizing Provider  acetaminophen (TYLENOL) 500 MG tablet Take 1,000 mg by mouth 3 (three) times daily.   Yes [provider]  aspirin EC 81 MG tablet Take 1 tablet (81 mg total) by mouth daily. Swallow whole. 10/27/20  Yes Rollene Rotunda, MD  B Complex-C (B-COMPLEX WITH VITAMIN C) tablet Take 1 tablet by mouth daily.   Yes [provider]  carvedilol (COREG) 3.125 MG tablet Take 1 tablet (3.125 mg total) by mouth 2 (  two) times daily with a meal. 05/11/21  Yes Elgergawy, Leana Roe, MD  cholecalciferol (VITAMIN D) 25 MCG (1000 UNIT) tablet Take 1,000 Units by mouth daily.   Yes [provider]  diclofenac Sodium (VOLTAREN) 1 % GEL Apply 1 application topically 2 (two) times daily. Back pain   Yes [provider]  divalproex (DEPAKOTE) 125 MG DR tablet Take 500 mg by mouth 2 (two) times daily. 06/27/21  Yes [provider]  DULoxetine (CYMBALTA) 20 MG capsule Take 20 mg by mouth daily. 06/15/21  Yes [provider]  ferrous sulfate 325 (65 FE) MG tablet Take 1 tablet (325 mg total) by mouth daily. 03/21/21  Yes Charlynne Pander, MD  HUMIRA PEN 40 MG/0.4ML PNKT Inject 40 mg into the skin every 14 (fourteen) days. On the 4th and 18th of the month 06/22/21  Yes [provider]  ibuprofen (ADVIL) 800 MG tablet Take 1 tablet (800 mg total) by mouth every 8 (eight) hours as needed for moderate pain. 06/29/21  Yes Bethann Berkshire, MD  pantoprazole (PROTONIX) 40 MG tablet Take 1 tablet (40 mg total) by mouth daily. 02/09/20  Yes Leroy Sea, MD  Pollen Extracts (PROSTAT PO) Take 30 mLs by mouth 2 (two) times  daily.   Yes [provider]  polyethylene glycol (MIRALAX / GLYCOLAX) 17 g packet Take 17 g by mouth See admin instructions. 17g orally on Sunday, Monday, Wednesday, Friday, Saturday   Yes [provider]  rosuvastatin (CRESTOR) 10 MG tablet Take 10 mg by mouth daily.   Yes [provider]  senna-docusate (SENOKOT-S) 8.6-50 MG tablet Take 2 tablets by mouth 2 (two) times daily.   Yes [provider]  sulfamethoxazole-trimethoprim (BACTRIM DS) 800-160 MG tablet Take 1 tablet by mouth 2 (two) times daily for 7 days. 06/29/21 07/06/21 Yes Bethann Berkshire, MD  thiamine 100 MG tablet Take 1 tablet (100 mg total) by mouth daily. 01/14/21  Yes Ghimire, Werner Lean, MD  acetaminophen (TYLENOL) 325 MG tablet Take 2 tablets (650 mg total) by mouth every 6 (six) hours as needed for mild pain, headache or moderate pain. Patient not taking: Reported on 06/29/2021 01/14/21   Maretta Bees, MD  citalopram (CELEXA) 10 MG tablet Take 1 tablet (10 mg total) by mouth daily. Patient not taking: Reported on 06/29/2021 08/29/20 08/29/21  Corwin Levins, MD  divalproex (DEPAKOTE SPRINKLE) 125 MG capsule Take 4 capsules (500 mg total) by mouth 2 (two) times daily. Patient not taking: Reported on 06/29/2021 05/11/21   Darlin Drop, DO  feeding supplement, GLUCERNA SHAKE, (GLUCERNA SHAKE) LIQD Take 237 mLs by mouth 3 (three) times daily between meals. Patient not taking: Reported on 06/29/2021 02/09/20   Leroy Sea, MD    Allergies    Ativan [lorazepam], Tramadol hcl, Amlodipine besylate, Atenolol, Benazepril, Benicar [olmesartan medoxomil], Cozaar, Hydralazine, Hydrochlorothiazide, Hydrochlorothiazide w-triamterene, Hydrocodone, Hydroxyzine pamoate, Iodine, Lisinopril, Peach flavor, Penicillins, Pravastatin, Prednisolone, Strawberry flavor, Tramadol hcl, and Benadryl [diphenhydramine]  Review of Systems   Review of Systems  Constitutional:  Negative for appetite change and fatigue.   HENT:  Negative for congestion, ear discharge and sinus pressure.   Eyes:  Negative for discharge.  Respiratory:  Negative for cough.   Cardiovascular:  Negative for chest pain.  Gastrointestinal:  Negative for abdominal pain and diarrhea.  Genitourinary:  Negative for frequency and hematuria.  Musculoskeletal:  Negative for back pain.  Skin:  Negative for rash.  Neurological:  Negative for seizures and headaches.  Psychiatric/Behavioral:  Negative for hallucinations.    Physical Exam Updated Vital Signs BP (!) 152/91   Pulse (!) 107   Temp 98.2 F (36.8 C) (Oral)   Resp (!) 22   SpO2 99%   Physical Exam Vitals and nursing note reviewed.  Constitutional:      Appearance: She is well-developed.  HENT:     Head: Normocephalic.     Nose: Nose normal.  Eyes:     General: No scleral icterus.    Conjunctiva/sclera: Conjunctivae normal.  Neck:     Thyroid: No thyromegaly.  Cardiovascular:     Rate and Rhythm: Normal rate and regular rhythm.     Heart sounds: No murmur heard.   No friction rub. No gallop.  Pulmonary:     Breath sounds: No stridor. No wheezing or rales.  Chest:     Chest wall: No tenderness.  Abdominal:     General: There is no distension.     Tenderness: There is no abdominal tenderness. There is no rebound.  Musculoskeletal:        General: Normal range of motion.     Cervical back: Neck supple.  Lymphadenopathy:     Cervical: No cervical adenopathy.  Skin:    Findings: Erythema present. No rash.     Comments: Grade 1 decubitus ulcer healing appropriately  Neurological:     Mental Status: She is alert.     Motor: No abnormal muscle tone.     Coordination: Coordination normal.     Comments: Patient alert and oriented to person.  Her daughter states that she is almost back to her normal  Psychiatric:        Behavior: Behavior normal.    ED Results / Procedures / Treatments   Labs (all labs ordered are listed, but only abnormal results are  displayed) Labs Reviewed  CBC WITH DIFFERENTIAL/PLATELET - Abnormal; Notable for the following components:      Result Value   RBC 2.76 (*)    Hemoglobin 9.2 (*)    HCT 29.3 (*)    MCV 106.2 (*)    All other components within normal limits  COMPREHENSIVE METABOLIC PANEL - Abnormal; Notable for the following components:   Calcium 8.7 (*)    Albumin 2.8 (*)    All other components within normal limits  URINALYSIS, ROUTINE W REFLEX MICROSCOPIC - Abnormal; Notable for the following components:   Protein, ur TRACE (*)    Leukocytes,Ua TRACE (*)    All other components within normal limits  BLOOD GAS, ARTERIAL - Abnormal; Notable for the following components:   pO2, Arterial 117 (*)    All other components within normal limits  BRAIN NATRIURETIC PEPTIDE - Abnormal; Notable for the following components:   B Natriuretic Peptide 239.9 (*)    All other components within normal limits  URINALYSIS, MICROSCOPIC (REFLEX) - Abnormal; Notable for the following components:   Non Squamous Epithelial PRESENT (*)    All other components within normal limits  RESP PANEL BY RT-PCR (FLU A&B, COVID) ARPGX2  URINE CULTURE  LACTIC ACID, PLASMA    EKG None  Radiology DG Chest Port 1 View  Result Date: 06/29/2021 CLINICAL DATA:  Weakness EXAM: PORTABLE CHEST 1 VIEW COMPARISON:  Chest radiograph 05/08/2021 FINDINGS: The cardiomediastinal silhouette is stable. There is calcified atherosclerotic plaque of the aortic arch. Lung volumes are low, resulting in bronchovascular crowding. There is no focal consolidation or pulmonary edema. There is no pleural effusion or pneumothorax There is no  acute osseous abnormality. Cholecystectomy clips are noted. IMPRESSION: Low lung volumes. Otherwise, no radiographic evidence of acute cardiopulmonary process. Electronically Signed   By: Lesia Hausen M.D.   On: 06/29/2021 12:29    Procedures Procedures   Medications Ordered in ED Medications  sodium chloride 0.9 %  bolus 500 mL (0 mLs Intravenous Stopped 06/29/21 1452)    ED Course  I have reviewed the triage vital signs and the nursing notes.  Pertinent labs & imaging results that were available during my care of the patient were reviewed by me and considered in my medical decision making (see chart for details).   CRITICAL CARE Performed by: Bethann Berkshire Total critical care time: 40 minutes Critical care time was exclusive of separately billable procedures and treating other patients. Critical care was necessary to treat or prevent imminent or life-threatening deterioration. Critical care was time spent personally by me on the following activities: development of treatment plan with patient and/or surrogate as well as nursing, discussions with consultants, evaluation of patient's response to treatment, examination of patient, obtaining history from patient or surrogate, ordering and performing treatments and interventions, ordering and review of laboratory studies, ordering and review of radiographic studies, pulse oximetry and re-evaluation of patient's condition. Patient had a seizure in the emergency department.  She was given 1 of Ativan and a gram of Keppra.  Her Depakote level came back low and she was given a gram of Depakote.  I spoke with neurology Dr. Petra Kuba and we will increase her Depakote to 750 mg twice a day.  She will be admitted for observation MDM Rules/Calculators/A&P                       Altered mental status most likely related to seizure.  She is admitted to medicine Final Clinical Impression(s) / ED Diagnoses Final diagnoses:  Confusion    Rx / DC Orders ED Discharge Orders          Ordered    sulfamethoxazole-trimethoprim (BACTRIM DS) 800-160 MG tablet  2 times daily        06/29/21 1452    ibuprofen (ADVIL) 800 MG tablet  Every 8 hours PRN        06/29/21 1452             Bethann Berkshire, MD 06/29/21 1457    Bethann Berkshire, MD 07/03/21 1609

## 2021-06-29 NOTE — Discharge Instructions (Addendum)
Follow-up with your family doctor next week for recheck.   Increase Depakote to 750mg  twice daily Continue amoxicillin for UTI to complete 7-day course Please ensure patient is being changed frequently and not lying in wetness as this is likely factor in her recurrent UTIs

## 2021-06-29 NOTE — ED Notes (Signed)
MD [place] notified that pt is currently too postictal to take medication PO.

## 2021-06-29 NOTE — ED Triage Notes (Addendum)
Pt BIB EMS from [facility]. Facility reports pt is nonverbal today, pt baseline is A&Ox3. Facility does not have any other complaints at this time. EMS reports pt did not speak to them.   CBG 85

## 2021-06-29 NOTE — H&P (Addendum)
TRH H&P    Patient Demographics:    [name], is a 87 y.o. female  MRN: [id number]  DOB - 30-Dec-1932  Admit Date - 06/29/2021  Referring MD/NP/PA: Dr. Estell Harpin  Outpatient Primary MD for the patient is Plotnikov, [name], MD  Patient coming from: Skilled nursing facility  Chief complaint-altered mental status   HPI:    [name]  is a 87 y.o. female, with history of seizure disorder, CAD s/p stent x2, rheumatoid arthritis, bedbound with total care, hypertension, hyperlipidemia, depression, iron-deficiency anemia, osteoporosis was brought to the hospital after patient was found to be in altered mental status at the skilled nursing facility.  As per nursing staff patient is usually alert and oriented x3 however she was nonverbal today.  In the ED lab work showed abnormal UA, plan was to send her back on antibiotics, however patient had seizure in the ED.  CT head was unremarkable.  Depakote level obtained in the ED was subtherapeutic at 28.  Patient takes Depakote 500 mg p.o. twice daily at skilled facility.  Neurology was consulted by ED provider who recommended to increase the dose of Depakote to 750 mg p.o. twice daily. Patient also received 1 g of Keppra along with 1 g of Depakote IV in the ED. Patient is postictal, confused and not answering questions at this time. As per daughter there has been no history of Nausea vomiting or diarrhea She has history of constipation No chest pain or shortness of breath    Review of systems:    In addition to the HPI above,   All other systems reviewed and are negative.    Past History of the following :    Past Medical History:  Diagnosis Date   Anemia    iron deficiency   Aneurysm, thoracic aortic (HCC)    Anxiety    B12 deficiency    CAD (coronary artery disease)    s/p stenting of LAD 1999- cath 5-08 EF normal LAD 30-40% restenosis. D1 50% D2  80% LCX & RCA minimal plaque   Chronic back pain    Constipation    Depression    GERD (gastroesophageal reflux disease)    Gout    HTN (hypertension)    Hyperlipemia    Obesity    Osteoporosis    Pancreatitis    Polyarthritis    DJD/ possible PMR   Renal insufficiency    Cr 1.2-1.3   Seizures (HCC)    Tinnitus    Urinary frequency    Vertigo    Vitamin D deficiency       Past Surgical History:  Procedure Laterality Date   ABDOMINAL HYSTERECTOMY     CARDIAC CATHETERIZATION  2008   L main 20%, LAD stent patent, D1 50%, D2 80% (small), RCA 20%, EF 55-60%   CHOLECYSTECTOMY     CORONARY ANGIOPLASTY WITH STENT PLACEMENT  1999   LAD stent   CORONARY STENT INTERVENTION N/A 10/16/2019   Procedure: CORONARY STENT INTERVENTION;  Surgeon: Marykay Lex, MD;  Location: [facility] INVASIVE CV  LAB;  Service: Cardiovascular;  Laterality: N/A;   HEMORRHOID SURGERY     LAPAROSCOPIC LYSIS OF ADHESIONS N/A 03/24/2015   Procedure: LAPAROSCOPIC LYSIS OF ADHESIONS;  Surgeon: Luretha Murphy, MD;  Location: [facility];  Service: General;  Laterality: N/A;   LAPAROSCOPY N/A 03/24/2015   Procedure: LAPAROSCOPY DIAGNOSTIC;  Surgeon: Luretha Murphy, MD;  Location: [facility];  Service: General;  Laterality: N/A;   LAPAROTOMY N/A 03/24/2015   Procedure: LAPAROTOMY with decompression of bowel;  Surgeon: Luretha Murphy, MD;  Location: [facility];  Service: General;  Laterality: N/A;   LEFT HEART CATH AND CORONARY ANGIOGRAPHY N/A 10/16/2019   Procedure: LEFT HEART CATH AND CORONARY ANGIOGRAPHY;  Surgeon: Marykay Lex, MD;  Location: [facility] INVASIVE CV LAB;  Service: Cardiovascular;  Laterality: N/A;   TUBAL LIGATION        Social History:      Social History   Tobacco Use   Smoking status: Never   Smokeless tobacco: Never  Substance Use Topics   Alcohol use: No       Family History :     Family History  Adopted: Yes  Problem Relation Age of Onset   Diabetes Mother    Hypertension Father    Cancer Sister        Home Medications:   Prior to Admission medications   Medication Sig Start Date End Date Taking? Authorizing Provider  acetaminophen (TYLENOL) 500 MG tablet Take 1,000 mg by mouth 3 (three) times daily.   Yes [provider]  aspirin EC 81 MG tablet Take 1 tablet (81 mg total) by mouth daily. Swallow whole. 10/27/20  Yes Rollene Rotunda, MD  B Complex-C (B-COMPLEX WITH VITAMIN C) tablet Take 1 tablet by mouth daily.   Yes [provider]  carvedilol (COREG) 3.125 MG tablet Take 1 tablet (3.125 mg total) by mouth 2 (two) times daily with a meal. 05/11/21  Yes Elgergawy, Leana Roe, MD  cholecalciferol (VITAMIN D) 25 MCG (1000 UNIT) tablet Take 1,000 Units by mouth daily.   Yes [provider]  diclofenac Sodium (VOLTAREN) 1 % GEL Apply 1 application topically 2 (two) times daily. Back pain   Yes [provider]  divalproex (DEPAKOTE) 125 MG DR tablet Take 500 mg by mouth 2 (two) times daily. 06/27/21  Yes [provider]  DULoxetine (CYMBALTA) 20 MG capsule Take 20 mg by mouth daily. 06/15/21  Yes [provider]  ferrous sulfate 325 (65 FE) MG tablet Take 1 tablet (325 mg total) by mouth daily. 03/21/21  Yes Charlynne Pander, MD  HUMIRA PEN 40 MG/0.4ML PNKT Inject 40 mg into the skin every 14 (fourteen) days. On the 4th and 18th of the month 06/22/21  Yes [provider]  ibuprofen (ADVIL) 800 MG tablet Take 1 tablet (800 mg total) by mouth every 8 (eight) hours as needed for moderate pain. 06/29/21  Yes Bethann Berkshire, MD  pantoprazole (PROTONIX) 40 MG tablet Take 1 tablet (40 mg total) by mouth daily. 02/09/20  Yes Leroy Sea, MD  Pollen Extracts (PROSTAT PO) Take 30 mLs by mouth 2 (two) times daily.   Yes [provider]  polyethylene glycol (MIRALAX / GLYCOLAX) 17 g packet Take 17 g by mouth See admin instructions. 17g orally on Sunday, Monday, Wednesday, Friday, Saturday   Yes [provider]  rosuvastatin  (CRESTOR) 10 MG tablet Take 10 mg by mouth daily.   Yes [provider]  senna-docusate (SENOKOT-S) 8.6-50 MG tablet Take 2 tablets  by mouth 2 (two) times daily.   Yes [provider]  sulfamethoxazole-trimethoprim (BACTRIM DS) 800-160 MG tablet Take 1 tablet by mouth 2 (two) times daily for 7 days. 06/29/21 07/06/21 Yes Bethann Berkshire, MD  thiamine 100 MG tablet Take 1 tablet (100 mg total) by mouth daily. 01/14/21  Yes Ghimire, Werner Lean, MD  acetaminophen (TYLENOL) 325 MG tablet Take 2 tablets (650 mg total) by mouth every 6 (six) hours as needed for mild pain, headache or moderate pain. Patient not taking: Reported on 06/29/2021 01/14/21   Maretta Bees, MD  citalopram (CELEXA) 10 MG tablet Take 1 tablet (10 mg total) by mouth daily. Patient not taking: Reported on 06/29/2021 08/29/20 08/29/21  Corwin Levins, MD  divalproex (DEPAKOTE SPRINKLE) 125 MG capsule Take 4 capsules (500 mg total) by mouth 2 (two) times daily. Patient not taking: Reported on 06/29/2021 05/11/21   Darlin Drop, DO  feeding supplement, GLUCERNA SHAKE, (GLUCERNA SHAKE) LIQD Take 237 mLs by mouth 3 (three) times daily between meals. Patient not taking: Reported on 06/29/2021 02/09/20   Leroy Sea, MD     Allergies:     Allergies  Allergen Reactions   Ativan [Lorazepam] Other (See Comments)    Very confused   Tramadol Hcl Anxiety and Rash    Headache   Amlodipine Besylate Other (See Comments)     dizzy   Atenolol Other (See Comments)    Fatigue   Benazepril Cough   Benicar [Olmesartan Medoxomil] Other (See Comments)    HEADACHE   Cozaar Nausea And Vomiting   Hydralazine Other (See Comments)    Hair loss   Hydrochlorothiazide Other (See Comments)   Hydrochlorothiazide W-Triamterene Other (See Comments)     dizzy   Hydrocodone Other (See Comments)    HEADACHE   Hydroxyzine Pamoate Other (See Comments)    Per MAR   Iodine Other (See Comments)    Per MAR   Lisinopril Other (See  Comments) and Cough    Tired & fatigue   Peach Flavor Itching   Penicillins Itching    Has patient had a PCN reaction causing immediate rash, facial/tongue/throat swelling, SOB or lightheadedness with hypotension: No Has patient had a PCN reaction causing severe rash involving mucus membranes or skin necrosis: No Has patient had a PCN reaction that required hospitalization No Has patient had a PCN reaction occurring within the last 10 years: Yes If all of the above answers are "NO", then may proceed with Cephalosporin use.  tolerates cephalosporins OK    Pravastatin Other (See Comments)    Myalgias-muscle pain   Prednisolone Nausea Only and Other (See Comments)    Upset stomach   Strawberry Flavor Itching   Tramadol Hcl Other (See Comments)    headache   Benadryl [Diphenhydramine] Itching and Palpitations     Physical Exam:   Vitals  Blood pressure (!) 143/90, pulse (!) 107, temperature 98.2 F (36.8 C), temperature source Oral, resp. rate 14, SpO2 98 %.  1.  General: Appears in no acute distress, nonverbal  2. Psychiatric: Alert, nonverbal  3. Neurologic: Alert, not following commands  4. HEENMT:  Atraumatic normocephalic  5. Respiratory : Clear to auscultation bilaterally  6. Cardiovascular : S1-S2, regular, no murmur auscultated  7. Gastrointestinal:  Abdominal soft, nontender, no organomegaly     Data Review:    CBC Recent Labs  Lab 06/29/21 1115  WBC 8.2  HGB 9.2*  HCT 29.3*  PLT 228  MCV 106.2[facility]  33.3  MCHC 31.4  RDW 14.3  LYMPHSABS 1.8  MONOABS 0.5  EOSABS 0.2  BASOSABS 0.0   ------------------------------------------------------------------------------------------------------------------  Results for orders placed or performed during the hospital encounter of 06/29/21 (from the past 48 hour(s))  CBC with Differential/Platelet     Status: Abnormal   Collection Time: 06/29/21 11:15 AM  Result Value Ref Range   WBC 8.2 4.0 - 10.5  K/uL   RBC 2.76 (L) 3.87 - 5.11 MIL/uL   Hemoglobin 9.2 (L) 12.0 - 15.0 g/dL   HCT 16.1 (L) 09.6 - 04.5 %   MCV 106.2 (H) 80.0 - 100.0 fL   MCH 33.3 26.0 - 34.0 pg   MCHC 31.4 30.0 - 36.0 g/dL   RDW 40.9 81.1 - 91.4 %   Platelets 228 150 - 400 K/uL   nRBC 0.0 0.0 - 0.2 %   Neutrophils Relative % 69 %   Neutro Abs 5.7 1.7 - 7.7 K/uL   Lymphocytes Relative 22 %   Lymphs Abs 1.8 0.7 - 4.0 K/uL   Monocytes Relative 6 %   Monocytes Absolute 0.5 0.1 - 1.0 K/uL   Eosinophils Relative 2 %   Eosinophils Absolute 0.2 0.0 - 0.5 K/uL   Basophils Relative 0 %   Basophils Absolute 0.0 0.0 - 0.1 K/uL   Immature Granulocytes 1 %   Abs Immature Granulocytes 0.05 0.00 - 0.07 K/uL    Comment: Performed at [facility], [address], [place], [place] [address]  Comprehensive metabolic panel     Status: Abnormal   Collection Time: 06/29/21 11:15 AM  Result Value Ref Range   Sodium 139 135 - 145 mmol/L   Potassium 4.5 3.5 - 5.1 mmol/L   Chloride 107 98 - 111 mmol/L   CO2 25 22 - 32 mmol/L   Glucose, Bld 90 70 - 99 mg/dL    Comment: Glucose reference range applies only to samples taken after fasting for at least 8 hours.   BUN 18 8 - 23 mg/dL   Creatinine, Ser 6.21 0.44 - 1.00 mg/dL   Calcium 8.7 (L) 8.9 - 10.3 mg/dL   Total Protein 6.9 6.5 - 8.1 g/dL   Albumin 2.8 (L) 3.5 - 5.0 g/dL   AST 23 15 - 41 U/L   ALT 8 0 - 44 U/L   Alkaline Phosphatase 51 38 - 126 U/L   Total Bilirubin 0.5 0.3 - 1.2 mg/dL   GFR, Estimated >30 >86 mL/min    Comment: (NOTE) Calculated using the CKD-EPI Creatinine Equation (2021)    Anion gap 7 5 - 15    Comment: Performed at [facility], [address], [place], [place] [address]  Resp Panel by RT-PCR (Flu A&B, Covid) Nasopharyngeal Swab     Status: None   Collection Time: 06/29/21 11:17 AM   Specimen: Nasopharyngeal Swab; Nasopharyngeal(NP) swabs in vial transport medium  Result Value Ref Range   SARS Coronavirus 2 by RT  PCR NEGATIVE NEGATIVE    Comment: (NOTE) SARS-CoV-2 target nucleic acids are NOT DETECTED.  The SARS-CoV-2 RNA is generally detectable in upper respiratory specimens during the acute phase of infection. The lowest concentration of SARS-CoV-2 viral copies this assay can detect is 138 copies/mL. A negative result does not preclude SARS-Cov-2 infection and should not be used as the sole basis for treatment or other patient management decisions. A negative result may occur with  improper specimen collection/handling, submission of specimen other than nasopharyngeal swab, presence of viral mutation(s) within  the areas targeted by this assay, and inadequate number of viral copies(<138 copies/mL). A negative result must be combined with clinical observations, patient history, and epidemiological information. The expected result is Negative.  Fact Sheet for Patients:  BloggerCourse.com  Fact Sheet for Healthcare Providers:  SeriousBroker.it  This test is no t yet approved or cleared by the [place] FDA and  has been authorized for detection and/or diagnosis of SARS-CoV-2 by FDA under an Emergency Use Authorization (EUA). This EUA will remain  in effect (meaning this test can be used) for the duration of the COVID-19 declaration under Section 564(b)(1) of the Act, 21 U.S.C.section 360bbb-3(b)(1), unless the authorization is terminated  or revoked sooner.       Influenza A by PCR NEGATIVE NEGATIVE   Influenza B by PCR NEGATIVE NEGATIVE    Comment: (NOTE) The Xpert Xpress SARS-CoV-2/FLU/RSV plus assay is intended as an aid in the diagnosis of influenza from Nasopharyngeal swab specimens and should not be used as a sole basis for treatment. Nasal washings and aspirates are unacceptable for Xpert Xpress SARS-CoV-2/FLU/RSV testing.  Fact Sheet for Patients: BloggerCourse.com  Fact Sheet for Healthcare  Providers: SeriousBroker.it  This test is not yet approved or cleared by the [place] FDA and has been authorized for detection and/or diagnosis of SARS-CoV-2 by FDA under an Emergency Use Authorization (EUA). This EUA will remain in effect (meaning this test can be used) for the duration of the COVID-19 declaration under Section 564(b)(1) of the Act, 21 U.S.C. section 360bbb-3(b)(1), unless the authorization is terminated or revoked.  Performed at [facility], [address], [place], [place] [address]   Lactic acid, plasma     Status: None   Collection Time: 06/29/21 11:19 AM  Result Value Ref Range   Lactic Acid, Venous 1.1 0.5 - 1.9 mmol/L    Comment: Performed at [facility], [address] [place], [place] [address]  Brain natriuretic peptide     Status: Abnormal   Collection Time: 06/29/21 11:19 AM  Result Value Ref Range   B Natriuretic Peptide 239.9 (H) 0.0 - 100.0 pg/mL    Comment: Performed at [facility], [address], [place], [place] [address]  Valproic acid level     Status: Abnormal   Collection Time: 06/29/21 11:32 AM  Result Value Ref Range   Valproic Acid Lvl 28 (L) 50.0 - 100.0 ug/mL    Comment: Performed at [facility], [address] [place], [place] [address]  Urinalysis, Routine w reflex microscopic Urine, Catheterized     Status: Abnormal   Collection Time: 06/29/21 11:49 AM  Result Value Ref Range   Color, Urine YELLOW YELLOW   APPearance CLEAR CLEAR   Specific Gravity, Urine 1.010 1.005 - 1.030   pH 8.0 5.0 - 8.0   Glucose, UA NEGATIVE NEGATIVE mg/dL   Hgb urine dipstick NEGATIVE NEGATIVE   Bilirubin Urine NEGATIVE NEGATIVE   Ketones, ur NEGATIVE NEGATIVE mg/dL   Protein, ur TRACE (A) NEGATIVE mg/dL   Nitrite NEGATIVE NEGATIVE   Leukocytes,Ua TRACE (A) NEGATIVE    Comment: Performed at [facility], [address], [place], [place] [address]  Urinalysis, Microscopic (reflex)     Status: Abnormal   Collection Time: 06/29/21 11:49 AM  Result Value Ref Range   RBC / HPF NONE SEEN 0 - 5 RBC/hpf   WBC, UA 6-10 0 - 5 WBC/hpf   Bacteria, UA NONE SEEN NONE SEEN  Squamous Epithelial / LPF 6-10 0 - 5   Non Squamous Epithelial PRESENT (A) NONE SEEN    Comment: Performed at [facility], [address], [place], [place] [address]  Blood gas, arterial     Status: Abnormal   Collection Time: 06/29/21 12:09 PM  Result Value Ref Range   FIO2 21.00    Delivery systems ROOM AIR    pH, Arterial 7.437 7.350 - 7.450   pCO2 arterial 36.4 32.0 - 48.0 mmHg   pO2, Arterial 117 (H) 83.0 - 108.0 mmHg   Bicarbonate 24.1 20.0 - 28.0 mmol/L   Acid-Base Excess 0.6 0.0 - 2.0 mmol/L   O2 Saturation 98.1 %   Patient temperature 98.6    Collection site RIGHT RADIAL    Drawn by [id number]    Allens test (pass/fail) PASS PASS    Comment: Performed at [facility], [address] [place], [place] [address]    Chemistries  Recent Labs  Lab 06/29/21 1115  NA 139  K 4.5  CL 107  CO2 25  GLUCOSE 90  BUN 18  CREATININE 0.70  CALCIUM 8.7*  AST 23  ALT 8  ALKPHOS 51  BILITOT 0.5   ------------------------------------------------------------------------------------------------------------------  ------------------------------------------------------------------------------------------------------------------ GFR: CrCl cannot be calculated (Unknown ideal weight.). Liver Function Tests: Recent Labs  Lab 06/29/21 1115  AST 23  ALT 8  ALKPHOS 51  BILITOT 0.5  PROT 6.9  ALBUMIN 2.8*   No results for input(s): LIPASE, AMYLASE in the last 168 hours. No results for input(s): AMMONIA in the last 168 hours. Coagulation Profile: No results for input(s): INR, PROTIME in the last 168 hours. Cardiac Enzymes: No results for input(s): CKTOTAL, CKMB, CKMBINDEX, TROPONINI in the last 168  hours. BNP (last 3 results) No results for input(s): PROBNP in the last 8760 hours. HbA1C: No results for input(s): HGBA1C in the last 72 hours. CBG: No results for input(s): GLUCAP in the last 168 hours. Lipid Profile: No results for input(s): CHOL, HDL, LDLCALC, TRIG, CHOLHDL, LDLDIRECT in the last 72 hours. Thyroid Function Tests: No results for input(s): TSH, T4TOTAL, FREET4, T3FREE, THYROIDAB in the last 72 hours. Anemia Panel: No results for input(s): VITAMINB12, FOLATE, FERRITIN, TIBC, IRON, RETICCTPCT in the last 72 hours.  --------------------------------------------------------------------------------------------------------------- Urine analysis:    Component Value Date/Time   COLORURINE YELLOW 06/29/2021 1149   APPEARANCEUR CLEAR 06/29/2021 1149   LABSPEC 1.010 06/29/2021 1149   LABSPEC 1.020 04/23/2006 1313   PHURINE 8.0 06/29/2021 1149   GLUCOSEU NEGATIVE 06/29/2021 1149   GLUCOSEU NEGATIVE 02/28/2016 1559   HGBUR NEGATIVE 06/29/2021 1149   BILIRUBINUR NEGATIVE 06/29/2021 1149   BILIRUBINUR Negative 04/23/2006 1313   KETONESUR NEGATIVE 06/29/2021 1149   PROTEINUR TRACE (A) 06/29/2021 1149   UROBILINOGEN 0.2 02/28/2016 1559   NITRITE NEGATIVE 06/29/2021 1149   LEUKOCYTESUR TRACE (A) 06/29/2021 1149   LEUKOCYTESUR Trace 04/23/2006 1313      Imaging Results:    CT HEAD WO CONTRAST ( )  Result Date: 06/29/2021 CLINICAL DATA:  Altered mental status, nonverbal EXAM: CT HEAD WITHOUT CONTRAST TECHNIQUE: Contiguous axial images were obtained from the base of the skull through the vertex without intravenous contrast. COMPARISON:  CT head 05/08/2021 FINDINGS: Brain: There is no evidence of acute intracranial hemorrhage, extra-axial fluid collection, or infarct. Foci of hypodensity in the subcortical and periventricular white matter nonspecific but likely reflect sequelae of chronic white matter microangiopathy, unchanged. The ventricles are stable in size. There is no  midline shift. No mass lesion  is identified. And at the sella is noted. Vascular: There is calcification of the bilateral cavernous ICAs. Skull: Normal. Negative for fracture or focal lesion. Sinuses/Orbits: The paranasal sinuses are clear. A left lens implant is noted. The globes and orbits are otherwise unremarkable. Other: The mastoid air cells are clear. IMPRESSION: 1. No acute intracranial pathology. 2. Stable parenchymal volume loss and chronic white matter microangiopathy. Electronically Signed   By: Lesia Hausen M.D.   On: 06/29/2021 12:36   DG Chest Port 1 View  Result Date: 06/29/2021 CLINICAL DATA:  Weakness EXAM: PORTABLE CHEST 1 VIEW COMPARISON:  Chest radiograph 05/08/2021 FINDINGS: The cardiomediastinal silhouette is stable. There is calcified atherosclerotic plaque of the aortic arch. Lung volumes are low, resulting in bronchovascular crowding. There is no focal consolidation or pulmonary edema. There is no pleural effusion or pneumothorax There is no acute osseous abnormality. Cholecystectomy clips are noted. IMPRESSION: Low lung volumes. Otherwise, no radiographic evidence of acute cardiopulmonary process. Electronically Signed   By: Lesia Hausen M.D.   On: 06/29/2021 12:29    My personal review of EKG: Rhythm NSR, no ST changes   Assessment & Plan:    Active Problems:   Seizure (HCC)   Seizure-patient presented with breakthrough seizure due to Depakote level being subtherapeutic.  Patient takes Depakote 500 mg p.o. twice daily at skilled facility.  She received 1 dose of 1 g IV Depakote in the ED along with 1 g of Keppra IV.  Neurology, Dr. Amada Jupiter recommended to increase the dose of Depakote to 750 mg p.o. twice daily.  Will increase the dose of Depakote to 700 mg p.o. twice daily.  Monitor her closely in stepdown unit, seizure precautions. UTI-patient has abnormal UA, urine culture has been obtained.  We will start ceftriaxone 1 g IV daily.  Follow urine culture  results. History of rheumatoid arthritis-patient takes Humira as outpatient. Patient has healed grade 1 decubitus ulcer-present on admission. Coronary disease-continue aspirin, Coreg, Crestor Constipation-continue Senokot-S tablets, MiraLAX Anemia-chronic, patient has macrocytic anemia, hemoglobin 9.2, at baseline    DVT Prophylaxis-   SCDs   AM Labs Ordered, also please review Full Orders  Family Communication: Admission, patients condition and plan of care including tests being ordered have been discussed with the patient's daughter at bedside who indicate understanding and agree with the plan and Code Status.  Code Status: DNR  Admission status: Observation      Time spent in minutes : 60 minutes   Kurstin Dimarzo S Agustin Swatek M.D

## 2021-06-29 NOTE — ED Notes (Signed)
Per Dr. Estell Harpin, give 1mg  IV ativan- pts allergy lists as med "causes confusion". Per Dr. - give medication.

## 2021-06-29 NOTE — ED Notes (Signed)
PTAR called  

## 2021-06-29 NOTE — ED Notes (Signed)
Called pharmacy to obtain missing seizure medication, previous message requesting medication sent at 2120.

## 2021-06-29 NOTE — ED Notes (Signed)
ED Provider at bedside. 

## 2021-06-29 NOTE — ED Notes (Signed)
IV team at pt bedside securing second IV

## 2021-06-30 ENCOUNTER — Inpatient Hospital Stay (HOSPITAL_COMMUNITY): Payer: Medicare HMO

## 2021-06-30 DIAGNOSIS — I130 Hypertensive heart and chronic kidney disease with heart failure and stage 1 through stage 4 chronic kidney disease, or unspecified chronic kidney disease: Secondary | ICD-10-CM | POA: Diagnosis present

## 2021-06-30 DIAGNOSIS — F32A Depression, unspecified: Secondary | ICD-10-CM | POA: Diagnosis present

## 2021-06-30 DIAGNOSIS — E11649 Type 2 diabetes mellitus with hypoglycemia without coma: Secondary | ICD-10-CM | POA: Diagnosis not present

## 2021-06-30 DIAGNOSIS — E559 Vitamin D deficiency, unspecified: Secondary | ICD-10-CM | POA: Diagnosis present

## 2021-06-30 DIAGNOSIS — L89152 Pressure ulcer of sacral region, stage 2: Secondary | ICD-10-CM | POA: Diagnosis present

## 2021-06-30 DIAGNOSIS — G8929 Other chronic pain: Secondary | ICD-10-CM | POA: Diagnosis present

## 2021-06-30 DIAGNOSIS — I5032 Chronic diastolic (congestive) heart failure: Secondary | ICD-10-CM | POA: Diagnosis present

## 2021-06-30 DIAGNOSIS — E538 Deficiency of other specified B group vitamins: Secondary | ICD-10-CM | POA: Diagnosis present

## 2021-06-30 DIAGNOSIS — F419 Anxiety disorder, unspecified: Secondary | ICD-10-CM | POA: Diagnosis present

## 2021-06-30 DIAGNOSIS — L8990 Pressure ulcer of unspecified site, unspecified stage: Secondary | ICD-10-CM | POA: Diagnosis present

## 2021-06-30 DIAGNOSIS — G9341 Metabolic encephalopathy: Secondary | ICD-10-CM | POA: Diagnosis present

## 2021-06-30 DIAGNOSIS — R569 Unspecified convulsions: Secondary | ICD-10-CM | POA: Diagnosis not present

## 2021-06-30 DIAGNOSIS — K219 Gastro-esophageal reflux disease without esophagitis: Secondary | ICD-10-CM | POA: Diagnosis present

## 2021-06-30 DIAGNOSIS — D539 Nutritional anemia, unspecified: Secondary | ICD-10-CM | POA: Diagnosis present

## 2021-06-30 DIAGNOSIS — Z8616 Personal history of COVID-19: Secondary | ICD-10-CM | POA: Diagnosis not present

## 2021-06-30 DIAGNOSIS — E785 Hyperlipidemia, unspecified: Secondary | ICD-10-CM | POA: Diagnosis present

## 2021-06-30 DIAGNOSIS — N390 Urinary tract infection, site not specified: Secondary | ICD-10-CM | POA: Diagnosis present

## 2021-06-30 DIAGNOSIS — Z20822 Contact with and (suspected) exposure to covid-19: Secondary | ICD-10-CM | POA: Diagnosis present

## 2021-06-30 DIAGNOSIS — R4182 Altered mental status, unspecified: Secondary | ICD-10-CM | POA: Diagnosis not present

## 2021-06-30 DIAGNOSIS — G40909 Epilepsy, unspecified, not intractable, without status epilepticus: Secondary | ICD-10-CM | POA: Diagnosis present

## 2021-06-30 DIAGNOSIS — I2510 Atherosclerotic heart disease of native coronary artery without angina pectoris: Secondary | ICD-10-CM | POA: Diagnosis present

## 2021-06-30 DIAGNOSIS — B9689 Other specified bacterial agents as the cause of diseases classified elsewhere: Secondary | ICD-10-CM | POA: Diagnosis present

## 2021-06-30 DIAGNOSIS — R410 Disorientation, unspecified: Secondary | ICD-10-CM | POA: Diagnosis present

## 2021-06-30 DIAGNOSIS — F0390 Unspecified dementia without behavioral disturbance: Secondary | ICD-10-CM | POA: Diagnosis present

## 2021-06-30 DIAGNOSIS — N183 Chronic kidney disease, stage 3 unspecified: Secondary | ICD-10-CM | POA: Diagnosis present

## 2021-06-30 DIAGNOSIS — M069 Rheumatoid arthritis, unspecified: Secondary | ICD-10-CM | POA: Diagnosis present

## 2021-06-30 DIAGNOSIS — M353 Polymyalgia rheumatica: Secondary | ICD-10-CM | POA: Diagnosis present

## 2021-06-30 DIAGNOSIS — Z66 Do not resuscitate: Secondary | ICD-10-CM | POA: Diagnosis present

## 2021-06-30 LAB — COMPREHENSIVE METABOLIC PANEL
ALT: 10 U/L (ref 0–44)
AST: 42 U/L — ABNORMAL HIGH (ref 15–41)
Albumin: 2.5 g/dL — ABNORMAL LOW (ref 3.5–5.0)
Alkaline Phosphatase: 43 U/L (ref 38–126)
Anion gap: 6 (ref 5–15)
BUN: 20 mg/dL (ref 8–23)
CO2: 24 mmol/L (ref 22–32)
Calcium: 8.3 mg/dL — ABNORMAL LOW (ref 8.9–10.3)
Chloride: 107 mmol/L (ref 98–111)
Creatinine, Ser: 1.06 mg/dL — ABNORMAL HIGH (ref 0.44–1.00)
GFR, Estimated: 51 mL/min — ABNORMAL LOW (ref >60)
Glucose, Bld: 93 mg/dL (ref 70–99)
Potassium: 4.8 mmol/L (ref 3.5–5.1)
Sodium: 137 mmol/L (ref 135–145)
Total Bilirubin: 1.1 mg/dL (ref 0.3–1.2)
Total Protein: 5.8 g/dL — ABNORMAL LOW (ref 6.5–8.1)

## 2021-06-30 LAB — GLUCOSE, CAPILLARY
Glucose-Capillary: 74 mg/dL (ref 70–99)
Glucose-Capillary: 55 mg/dL — ABNORMAL LOW (ref 70–99)
Glucose-Capillary: 57 mg/dL — ABNORMAL LOW (ref 70–99)
Glucose-Capillary: 61 mg/dL — ABNORMAL LOW (ref 70–99)
Glucose-Capillary: 98 mg/dL (ref 70–99)

## 2021-06-30 LAB — CBC
HCT: 25.7 % — ABNORMAL LOW (ref 36.0–46.0)
Hemoglobin: 8.1 g/dL — ABNORMAL LOW (ref 12.0–15.0)
MCH: 33.1 pg (ref 26.0–34.0)
MCHC: 31.5 g/dL (ref 30.0–36.0)
MCV: 104.9 fL — ABNORMAL HIGH (ref 80.0–100.0)
Platelets: 228 10*3/uL (ref 150–400)
RBC: 2.45 MIL/uL — ABNORMAL LOW (ref 3.87–5.11)
RDW: 14.5 % (ref 11.5–15.5)
WBC: 7.1 10*3/uL (ref 4.0–10.5)
nRBC: 0 % (ref 0.0–0.2)

## 2021-06-30 LAB — MRSA NEXT GEN BY PCR, NASAL: MRSA by PCR Next Gen: DETECTED — AB

## 2021-06-30 LAB — GLUCOSE, RANDOM: Glucose, Bld: 85 mg/dL (ref 70–99)

## 2021-06-30 MED ORDER — GADOBUTROL 1 MMOL/ML IV SOLN
6.0000 mL | Freq: Once | INTRAVENOUS | Status: AC | PRN
  Administered 2021-06-30: 6 mL via INTRAVENOUS

## 2021-06-30 MED ORDER — GLUCAGON HCL RDNA (DIAGNOSTIC) 1 MG IJ SOLR
INTRAMUSCULAR | Status: AC
  Administered 2021-06-30: 1 mg
  Filled 2021-06-30: qty 1

## 2021-06-30 MED ORDER — ORAL CARE MOUTH RINSE
15.0000 mL | Freq: Two times a day (BID) | OROMUCOSAL | Status: DC
  Administered 2021-06-30 – 2021-07-03 (×7): 15 mL via OROMUCOSAL

## 2021-06-30 MED ORDER — ACETAMINOPHEN 10 MG/ML IV SOLN
1000.0000 mg | Freq: Once | INTRAVENOUS | Status: AC
  Administered 2021-07-01: 1000 mg via INTRAVENOUS
  Filled 2021-06-30: qty 100

## 2021-06-30 MED ORDER — CHLORHEXIDINE GLUCONATE CLOTH 2 % EX PADS
6.0000 | MEDICATED_PAD | Freq: Every day | CUTANEOUS | Status: DC
  Administered 2021-06-30 – 2021-07-03 (×5): 6 via TOPICAL

## 2021-06-30 MED ORDER — MUPIROCIN 2 % EX OINT
1.0000 | TOPICAL_OINTMENT | Freq: Two times a day (BID) | CUTANEOUS | Status: DC
Start: 2021-06-30 — End: 2021-07-03
  Administered 2021-06-30 – 2021-07-03 (×7): 1 via NASAL
  Filled 2021-06-30 (×3): qty 22

## 2021-06-30 MED ORDER — CHLORHEXIDINE GLUCONATE CLOTH 2 % EX PADS: 6.0000 | MEDICATED_PAD | Freq: Every day | CUTANEOUS | Status: DC

## 2021-06-30 MED ORDER — VALPROATE SODIUM 100 MG/ML IV SOLN
750.0000 mg | Freq: Two times a day (BID) | INTRAVENOUS | Status: DC
  Administered 2021-06-30 – 2021-07-01 (×3): 750 mg via INTRAVENOUS
  Filled 2021-06-30 (×3): qty 7.5

## 2021-06-30 MED ORDER — STERILE WATER FOR INJECTION IJ SOLN
INTRAMUSCULAR | Status: AC
  Filled 2021-06-30: qty 10

## 2021-06-30 MED ORDER — GLUCAGON HCL RDNA (DIAGNOSTIC) 1 MG IJ SOLR
1.0000 mg | Freq: Once | INTRAMUSCULAR | Status: AC
  Administered 2021-06-30: 1 mg via SUBCUTANEOUS
  Filled 2021-06-30: qty 1

## 2021-06-30 NOTE — ED Notes (Signed)
On call MD notified of pt temp.

## 2021-06-30 NOTE — Progress Notes (Addendum)
PROGRESS NOTE    Shelby MEENACH  Jefferson:[id number] DOB: 09-05-1933 DOA: 06/29/2021 PCP: Tresa Garter, MD    Brief Narrative:  [name] is an 87 year old female with past medical history significant for seizure disorder, CAD s/p stent x2, rheumatoid arthritis, essential hypertension, hyperlipidemia, depression, iron deficiency anemia, osteoporosis, bedbound with total care at baseline who presents to [facility] ED from [facility] with confusion.  Per daughter, usually alert and oriented x3 with no history of nausea/vomiting/diarrhea.  She had not been complaining of any chest pain or shortness of breath.  In the ED, temperature 98.2 F, HR 103, RR 22, BP 157/79, SPO2 100% on room air.  Sodium 139, potassium 4.5, chloride 107, CO2 25, BUN 18, creatinine 0.70, glucose 90.  AST 23, ALT 8, total bilirubin 0.5.  BNP 239.9.  Lactic acid 1.1.  WBC count 8.2, hemoglobin 9.2, platelets 228.  COVID-19 PCR negative.  Influenza A/B PCR negative.  Valproic acid level 28, subtherapeutic.  Urinalysis with trace leukocytes, negative nitrite, no bacteria, 6-10 WBCs.  Chest x-ray with low lung volumes, otherwise negative for acute cardiopulmonary disease process.  CT head without contrast with no acute intracranial pathology; stable parenchymal volume loss and chronic white matter microangiopathy.  Initial plan in the ED was concern for UTI and subsequently patient had a seizure in the ED.  Patient was loaded with IV Keppra/Depakote.  Neurology was consulted by ED provider who recommended to increase the dose of Depakote to 750 mg p.o. twice daily due to subtherapeutic levels.  TRH consulted for further evaluation and management of acute metabolic encephalopathy secondary to recurrent seizure with subtherapeutic Depakote level and UTI.   Assessment & Plan:   Active Problems:   Hyperlipidemia   Depression   Chronic diastolic CHF (congestive heart failure) (HCC)   Seizure (HCC)   Pressure injury of  skin   Acute metabolic encephalopathy   Acute metabolic encephalopathy Patient presenting from SNF in which she is bedbound at baseline but usually alert and oriented x3.  Patient was nonverbal.  Initial thought in the ED was this was related to a urinary tract infection and she was started on antibiotics but subsequently developed seizure.  Patient CT head without contrast with no acute abnormality.  Depakote level was subtherapeutic.  Patient remains postictal and confused.  Continue treatment as below. --Will have SLP evaluate swallow once mental status improves --IVF with D5 half NS at 75 MLS per hour  Recurrent seizure Patient with history of seizure disorder on Depakote 500 mg p.o. twice daily at baseline.  While in the ED, patient was observed to have a recurrent seizure.  CT head without contrast unrevealing.  Patient was given IV load of Keppra 1 g and Depakote 1 g IV.  Case was discussed with on-call neurology who recommended increased dose of Depakote to 750 mg p.o. twice daily. --Patient remains confused/altered; postictal --Continue n.p.o. until mental status improves --Depakote 750 mg IV every 12 hours; will transition to oral dosing once mental status improves and can tolerate oral intake --Seizure precautions --Monitor on telemetry  Urinary tract infection Urinalysis with trace leukocytes, negative nitrite, no bacteria, 6-10 WBCs.  Initially concerned that UTI causing her acute encephalopathy as above.  Previous history of E. coli, Klebsiella, Pseudomonas, Enterococcus, Enterobacter UTI on review of EMR. --Urine culture: Pending --Continue ceftriaxone 1 g IV q24h  Essential hypertension --Hold home carvedilol 3.125 mg p.o. twice daily if tolerating oral intake --Continue aspirin and statin if tolerating  oral intake  HLD: Crestor 10 mg p.o. daily, once tolerates oral intake  Depression/anxiety: Duloxetine 20 mg p.o. nightly once mental status improves   DVT prophylaxis:  SCDs Start: 06/29/21 2015   Code Status: DNR Family Communication: No family present at bedside this morning, attempted to update patient's daughter [name] via telephone unsuccessful, went to voicemail.  Message left.  Disposition Plan:  Level of care: Stepdown Status is: Inpatient  Remains inpatient appropriate because:Altered mental status, Unsafe d/c plan, IV treatments appropriate due to intensity of illness or inability to take PO, and Inpatient level of care appropriate due to severity of illness  Dispo: The patient is from: SNF              Anticipated d/c is to: SNF              Patient currently is not medically stable to d/c.   Difficult to place patient No   Consultants:  EDP discussed with on-call neurology  Procedures:  None  Antimicrobials:  Ceftriaxone 8/18>>   Subjective: Patient seen examined at bedside, resting comfortably.  Remains confused, nonverbal though will respond to some verbal commands, squeezes hands.  No family present at bedside this morning.  Continues in ED holding area, awaiting bed in stepdown unit.  Given her continued confusion and likely unable to tolerate oral medications at this time; will continue n.p.o. and change Depakote to IV formulation until mental status improves.  Unable to obtain any further ROS from patient given her current mental status.  No acute concerns per nursing staff this morning.  Objective: Vitals:   06/30/21 0545 06/30/21 0716 06/30/21 0900 06/30/21 1000  BP: (!) 154/73 115/79 (!) 164/84 (!) 98/45  Pulse: 93 80  82  Resp: (!) 22 18 14 16   Temp:  97.8 F (36.6 C) 97.6 F (36.4 C)   TempSrc:  Oral Oral   SpO2: 100% 100% 100% 100%  Weight:   62.2 kg   Height:   5\' 5"  (1.651 m)     Intake/Output Summary (Last 24 hours) at 06/30/2021 1042 Last data filed at 06/30/2021 1000 Gross per 24 hour  Intake 1690.87 ml  Output 100 ml  Net 1590.87 ml   Filed Weights   06/30/21 0900  Weight: 62.2 kg     Examination:  General exam: Appears calm and comfortable, confused and nonverbal Respiratory system: Clear to auscultation. Respiratory effort normal.  On room air Cardiovascular system: S1 & S2 heard, RRR. No JVD, murmurs, rubs, gallops or clicks. No pedal edema. Gastrointestinal system: Abdomen is nondistended, soft and nontender. No organomegaly or masses felt. Normal bowel sounds heard. Central nervous system: Somnolent, nonverbal, will respond to verbal commands, squeezes hands, otherwise no other focal neurological deficits. Extremities: Moves all extremities independently Skin: No rashes, lesions or ulcers Psychiatry: Unable to assess due to her current mental status    Data Reviewed: I have personally reviewed following labs and imaging studies  CBC: Recent Labs  Lab 06/29/21 1115 06/30/21 0434  WBC 8.2 7.1  NEUTROABS 5.7  --   HGB 9.2* 8.1*  HCT 29.3* 25.7*  MCV 106.2* 104.9*  PLT 228 228   Basic Metabolic Panel: Recent Labs  Lab 06/29/21 1115 06/30/21 0434  NA 139 137  K 4.5 4.8  CL 107 107  CO2 25 24  GLUCOSE 90 93  BUN 18 20  CREATININE 0.70 1.06*  CALCIUM 8.7* 8.3*   GFR: Estimated Creatinine Clearance: 34.3 mL/min (A) (by C-G formula based on  SCr of 1.06 mg/dL (H)). Liver Function Tests: Recent Labs  Lab 06/29/21 1115 06/30/21 0434  AST 23 42*  ALT 8 10  ALKPHOS 51 43  BILITOT 0.5 1.1  PROT 6.9 5.8*  ALBUMIN 2.8* 2.5*   No results for input(s): LIPASE, AMYLASE in the last 168 hours. No results for input(s): AMMONIA in the last 168 hours. Coagulation Profile: No results for input(s): INR, PROTIME in the last 168 hours. Cardiac Enzymes: No results for input(s): CKTOTAL, CKMB, CKMBINDEX, TROPONINI in the last 168 hours. BNP (last 3 results) No results for input(s): PROBNP in the last 8760 hours. HbA1C: No results for input(s): HGBA1C in the last 72 hours. CBG: No results for input(s): GLUCAP in the last 168 hours. Lipid Profile: No  results for input(s): CHOL, HDL, LDLCALC, TRIG, CHOLHDL, LDLDIRECT in the last 72 hours. Thyroid Function Tests: No results for input(s): TSH, T4TOTAL, FREET4, T3FREE, THYROIDAB in the last 72 hours. Anemia Panel: No results for input(s): VITAMINB12, FOLATE, FERRITIN, TIBC, IRON, RETICCTPCT in the last 72 hours. Sepsis Labs: Recent Labs  Lab 06/29/21 1119  LATICACIDVEN 1.1    Recent Results (from the past 240 hour(s))  Resp Panel by RT-PCR (Flu A&B, Covid) Nasopharyngeal Swab     Status: None   Collection Time: 06/29/21 11:17 AM   Specimen: Nasopharyngeal Swab; Nasopharyngeal(NP) swabs in vial transport medium  Result Value Ref Range Status   SARS Coronavirus 2 by RT PCR NEGATIVE NEGATIVE Final    Comment: (NOTE) SARS-CoV-2 target nucleic acids are NOT DETECTED.  The SARS-CoV-2 RNA is generally detectable in upper respiratory specimens during the acute phase of infection. The lowest concentration of SARS-CoV-2 viral copies this assay can detect is 138 copies/mL. A negative result does not preclude SARS-Cov-2 infection and should not be used as the sole basis for treatment or other patient management decisions. A negative result may occur with  improper specimen collection/handling, submission of specimen other than nasopharyngeal swab, presence of viral mutation(s) within the areas targeted by this assay, and inadequate number of viral copies(<138 copies/mL). A negative result must be combined with clinical observations, patient history, and epidemiological information. The expected result is Negative.  Fact Sheet for Patients:  BloggerCourse.com  Fact Sheet for Healthcare Providers:  SeriousBroker.it  This test is no t yet approved or cleared by the [place] FDA and  has been authorized for detection and/or diagnosis of SARS-CoV-2 by FDA under an Emergency Use Authorization (EUA). This EUA will remain  in effect  (meaning this test can be used) for the duration of the COVID-19 declaration under Section 564(b)(1) of the Act, 21 U.S.C.section 360bbb-3(b)(1), unless the authorization is terminated  or revoked sooner.       Influenza A by PCR NEGATIVE NEGATIVE Final   Influenza B by PCR NEGATIVE NEGATIVE Final    Comment: (NOTE) The Xpert Xpress SARS-CoV-2/FLU/RSV plus assay is intended as an aid in the diagnosis of influenza from Nasopharyngeal swab specimens and should not be used as a sole basis for treatment. Nasal washings and aspirates are unacceptable for Xpert Xpress SARS-CoV-2/FLU/RSV testing.  Fact Sheet for Patients: BloggerCourse.com  Fact Sheet for Healthcare Providers: SeriousBroker.it  This test is not yet approved or cleared by the [place] FDA and has been authorized for detection and/or diagnosis of SARS-CoV-2 by FDA under an Emergency Use Authorization (EUA). This EUA will remain in effect (meaning this test can be used) for the duration of the COVID-19 declaration under Section 564(b)(1) of the Act, 21  U.S.C. section 360bbb-3(b)(1), unless the authorization is terminated or revoked.  Performed at [facility], [address], [place], [place] [address]          Radiology Studies: CT HEAD WO CONTRAST ( )  Result Date: 06/29/2021 CLINICAL DATA:  Altered mental status, nonverbal EXAM: CT HEAD WITHOUT CONTRAST TECHNIQUE: Contiguous axial images were obtained from the base of the skull through the vertex without intravenous contrast. COMPARISON:  CT head 05/08/2021 FINDINGS: Brain: There is no evidence of acute intracranial hemorrhage, extra-axial fluid collection, or infarct. Foci of hypodensity in the subcortical and periventricular white matter nonspecific but likely reflect sequelae of chronic white matter microangiopathy, unchanged. The ventricles are stable in size. There is no midline shift.  No mass lesion is identified. And at the sella is noted. Vascular: There is calcification of the bilateral cavernous ICAs. Skull: Normal. Negative for fracture or focal lesion. Sinuses/Orbits: The paranasal sinuses are clear. A left lens implant is noted. The globes and orbits are otherwise unremarkable. Other: The mastoid air cells are clear. IMPRESSION: 1. No acute intracranial pathology. 2. Stable parenchymal volume loss and chronic white matter microangiopathy. Electronically Signed   By: Lesia Hausen M.D.   On: 06/29/2021 12:36   DG Chest Port 1 View  Result Date: 06/29/2021 CLINICAL DATA:  Weakness EXAM: PORTABLE CHEST 1 VIEW COMPARISON:  Chest radiograph 05/08/2021 FINDINGS: The cardiomediastinal silhouette is stable. There is calcified atherosclerotic plaque of the aortic arch. Lung volumes are low, resulting in bronchovascular crowding. There is no focal consolidation or pulmonary edema. There is no pleural effusion or pneumothorax There is no acute osseous abnormality. Cholecystectomy clips are noted. IMPRESSION: Low lung volumes. Otherwise, no radiographic evidence of acute cardiopulmonary process. Electronically Signed   By: Lesia Hausen M.D.   On: 06/29/2021 12:29        Scheduled Meds:  acetaminophen  1,000 mg Oral TID   aspirin EC  81 mg Oral Daily   carvedilol  3.125 mg Oral BID WC   Chlorhexidine Gluconate Cloth  6 each Topical Daily   DULoxetine  20 mg Oral QHS   mouth rinse  15 mL Mouth Rinse BID   polyethylene glycol  17 g Oral Once per day on Sun Mon Wed Fri   rosuvastatin  10 mg Oral QHS   senna-docusate  2 tablet Oral BID   thiamine injection  100 mg Intravenous Daily   Continuous Infusions:  cefTRIAXone (ROCEPHIN)  IV Stopped (06/29/21 2131)   dextrose 5 % and 0.45% NaCl Stopped (06/30/21 1028)   valproate sodium 750 mg (06/30/21 1028)     LOS: 0 days    Time spent: 36 minutes spent on chart review, discussion with nursing staff, consultants, updating family and  interview/physical exam; more than 50% of that time was spent in counseling and/or coordination of care.    Alvira Philips [place], DO [facility] Available via Epic secure chat 7am-7pm After these hours, please refer to coverage provider listed on [email or web address] 06/30/2021, 10:42 AM

## 2021-06-30 NOTE — ED Notes (Signed)
Patient resting with eyes closed.  Responds to verbal stimuli and opens her mouth when asked to take her temperature.  Color appropriate.  Continuous cardiac monitor with pulse ox in place.  Seizure pads in place on side rails.

## 2021-06-30 NOTE — Progress Notes (Addendum)
Hypoglycemic Event  CBG: 44 and 55  Treatment: Glucagon IM 1 mg  Symptoms: None  Follow-up CBG: Time:  2145 CBG Result:  61  Possible Reasons for Event: Other: IV's infiltrated ; On 5/1/2NS IVF; IV Team consult changed to STAT 2200 Another Glucagon 1 mg order by MD given and IV team restarted IV for IVF with D5  Comments/MD notified:Yes    [name]

## 2021-06-30 NOTE — Progress Notes (Signed)
Initial Nutrition Assessment  DOCUMENTATION CODES:   Non-severe (moderate) malnutrition in context of chronic illness  INTERVENTION:  - diet advancement as medically feasible.   NUTRITION DIAGNOSIS:   Moderate Malnutrition related to chronic illness (osteoporosis) as evidenced by moderate fat depletion, moderate muscle depletion.  GOAL:   Patient will meet greater than or equal to 90% of their needs  MONITOR:   Diet advancement, Labs, Weight trends, Skin  REASON FOR ASSESSMENT:   Malnutrition Screening Tool  ASSESSMENT:   87 year old female with medical history of seizure disorder, CAD s/p stent x2, rheumatoid arthritis, HTN, HLD, depression, iron deficiency anemia, osteoporosis, and bedbound with total care at baseline. She presented to the ED from [facility] d/t confusion.  She has been NPO since admission. Patient noted to be a/o to self only. Daughter in the room but either on the phone or in the bathroom getting ready for the day during attempted visits and actual visit.   Patient did not awake or vocalize during NFPE. She has not been seen by a [place] RD since 04/2015.   MST report note indicates that patient's daughter stated that patient has lost 30 lb since 01/2021.   Weight today is 137 lb and weight on 02/06/21 was 146 lb. Weight on 01/08/21 was 168 lb. This indicates 31 lb weight loss (18% body weight) in the past 6 months; significant for time frame.   Per notes: - acute metabolic encephalopathy - UTI - recurrent seizures - pending swallow evaluation by SLP once mentation has improved   Labs reviewed; CBG: 74 mg/dl, Ca: 8.3 mg/dl, AST elevated, GFR: 51 ml/min.  Medications reviewed; 40 mg IV protonix x1 dose 8/18, 17 g miralax/day, 2 tablets senokot BID, 100 mg IV thiamine/day. IVF; D5-1/2 NS @ 75 ml/hr (306 kcal/24 hrs).     NUTRITION - FOCUSED PHYSICAL EXAM:  Flowsheet Row Most Recent Value  Orbital Region Mild depletion  Upper Arm Region  Moderate depletion  Thoracic and Lumbar Region Unable to assess  Buccal Region Moderate depletion  Temple Region Mild depletion  Clavicle Bone Region Moderate depletion  Clavicle and Acromion Bone Region Severe depletion  Scapular Bone Region Unable to assess  Dorsal Hand No depletion  Patellar Region No depletion  Anterior Thigh Region No depletion  Posterior Calf Region No depletion  Edema (RD Assessment) None  Hair Reviewed  Eyes Reviewed  Mouth Unable to assess  Skin Reviewed  Nails Reviewed       Diet Order:   Diet Order             Diet NPO time specified  Diet effective now                   EDUCATION NEEDS:   No education needs have been identified at this time  Skin:  Skin Assessment: Skin Integrity Issues: Skin Integrity Issues:: Stage II Stage II: sacrum  Last BM:  PTA/unknown  Height:   Ht Readings from Last 1 Encounters:  06/30/21 5\' 5"  (1.651 m)    Weight:   Wt Readings from Last 1 Encounters:  06/30/21 62.2 kg     Estimated Nutritional Needs:  Kcal:  1575-1800 kcal Protein:  75-85 grams Fluid:  >/= 1.8 L/day     07/02/21, MS, RD, LDN, CNSC Inpatient Clinical Dietitian RD pager # available in AMION  After hours/weekend pager # available in [facility]

## 2021-06-30 NOTE — ED Notes (Signed)
Dr [place] at the bedside for admission assessment

## 2021-06-30 NOTE — Plan of Care (Signed)
88/F patient admitted with AMS. No acute distress at this time.   Problem: Education: Goal: Knowledge of General Education information will improve Description: Including pain rating scale, medication(s)/side effects and non-pharmacologic comfort measures Outcome: Progressing   Problem: Health Behavior/Discharge Planning: Goal: Ability to manage health-related needs will improve Outcome: Progressing   Problem: Clinical Measurements: Goal: Ability to maintain clinical measurements within normal limits will improve Outcome: Progressing Goal: Will remain free from infection Outcome: Progressing Goal: Diagnostic test results will improve Outcome: Progressing Goal: Respiratory complications will improve Outcome: Progressing Goal: Cardiovascular complication will be avoided Outcome: Progressing   Problem: Activity: Goal: Risk for activity intolerance will decrease Outcome: Progressing   Problem: Nutrition: Goal: Adequate nutrition will be maintained Outcome: Progressing   Problem: Coping: Goal: Level of anxiety will decrease Outcome: Progressing   Problem: Elimination: Goal: Will not experience complications related to bowel motility Outcome: Progressing Goal: Will not experience complications related to urinary retention Outcome: Progressing   Problem: Pain Managment: Goal: General experience of comfort will improve Outcome: Progressing   Problem: Safety: Goal: Ability to remain free from injury will improve Outcome: Progressing   Problem: Skin Integrity: Goal: Risk for impaired skin integrity will decrease Outcome: Progressing

## 2021-06-30 NOTE — ED Notes (Signed)
Celene Kras, RN was sent a secure message in regards to the patient having a bed assignment 1230.  Waiting to hear back from the nurse to transfer to ICU

## 2021-06-30 NOTE — Plan of Care (Signed)
Discussed with patient plan of care for the evening, pain management and pure wick with some teach back displayed  Problem: Education: Goal: Knowledge of General Education information will improve Description: Including pain rating scale, medication(s)/side effects and non-pharmacologic comfort measures Outcome: Progressing   

## 2021-07-01 LAB — GLUCOSE, CAPILLARY
Glucose-Capillary: 101 mg/dL — ABNORMAL HIGH (ref 70–99)
Glucose-Capillary: 112 mg/dL — ABNORMAL HIGH (ref 70–99)
Glucose-Capillary: 119 mg/dL — ABNORMAL HIGH (ref 70–99)
Glucose-Capillary: 55 mg/dL — ABNORMAL LOW (ref 70–99)
Glucose-Capillary: 80 mg/dL (ref 70–99)
Glucose-Capillary: 99 mg/dL (ref 70–99)

## 2021-07-01 MED ORDER — SODIUM CHLORIDE 0.9 % IV SOLN
1.0000 g | Freq: Three times a day (TID) | INTRAVENOUS | Status: DC
  Administered 2021-07-01 – 2021-07-03 (×6): 1 g via INTRAVENOUS
  Filled 2021-07-01: qty 1000
  Filled 2021-07-01: qty 1
  Filled 2021-07-01 (×2): qty 1000
  Filled 2021-07-01 (×2): qty 1
  Filled 2021-07-01: qty 1000
  Filled 2021-07-01: qty 1

## 2021-07-01 MED ORDER — GLUCAGON HCL RDNA (DIAGNOSTIC) 1 MG IJ SOLR: 1.0000 mg | Freq: Once | INTRAMUSCULAR | Status: DC

## 2021-07-01 MED ORDER — CARVEDILOL 3.125 MG PO TABS
3.1250 mg | ORAL_TABLET | Freq: Two times a day (BID) | ORAL | Status: DC
  Administered 2021-07-01 – 2021-07-03 (×5): 3.125 mg via ORAL
  Filled 2021-07-01 (×5): qty 1

## 2021-07-01 MED ORDER — DEXTROSE 50 % IV SOLN
1.0000 | Freq: Once | INTRAVENOUS | Status: DC
  Filled 2021-07-01: qty 50

## 2021-07-01 MED ORDER — SODIUM CHLORIDE 0.9 % IV SOLN
500.0000 mg | Freq: Four times a day (QID) | INTRAVENOUS | Status: DC
  Filled 2021-07-01 (×2): qty 2

## 2021-07-01 MED ORDER — DIVALPROEX SODIUM 250 MG PO DR TAB
750.0000 mg | DELAYED_RELEASE_TABLET | Freq: Two times a day (BID) | ORAL | Status: DC
  Administered 2021-07-01 – 2021-07-03 (×4): 750 mg via ORAL
  Filled 2021-07-01 (×2): qty 1
  Filled 2021-07-01 (×2): qty 3

## 2021-07-01 NOTE — Progress Notes (Signed)
PHARMACY NOTE:  ANTIMICROBIAL RENAL DOSAGE ADJUSTMENT  Current antimicrobial regimen includes a mismatch between antimicrobial dosage and estimated renal function.  As per policy approved by the Pharmacy & Therapeutics and Medical Executive Committees, the antimicrobial dosage will be adjusted accordingly.  Current antimicrobial dosage:  Ampicillin 500mg  q6h  Indication: UTI  Renal Function:  Estimated Creatinine Clearance: 34.3 mL/min (A) (by C-G formula based on SCr of 1.06 mg/dL (H)).    Antimicrobial dosage has been changed to:  Ampicillin 1g IV q8h    Thank you for allowing pharmacy to be a part of this patient's care.  PharmD, BCPS Clinical Pharmacist [facility] main pharmacy [phone] 07/01/2021 1:24 PM

## 2021-07-01 NOTE — Plan of Care (Signed)
Discussed with patient plan of care for the evening, pain management and encouraging eating with some teach back displayed.  Patient does like room warm with door closed and warm blankets throughout the shift.  Problem: Education: Goal: Knowledge of General Education information will improve Description: Including pain rating scale, medication(s)/side effects and non-pharmacologic comfort measures Outcome: Progressing   Problem: Health Behavior/Discharge Planning: Goal: Ability to manage health-related needs will improve Outcome: Progressing

## 2021-07-01 NOTE — Progress Notes (Signed)
PROGRESS NOTE    [name]  JQB:[id number] DOB: May 27, 1933 DOA: 06/29/2021 PCP: Tresa Garter, MD    Brief Narrative:  [name] is an 87 year old female with past medical history significant for seizure disorder, CAD s/p stent x2, rheumatoid arthritis, essential hypertension, hyperlipidemia, depression, iron deficiency anemia, osteoporosis, bedbound with total care at baseline who presents to [facility] ED from [facility] with confusion.  Per daughter, usually alert and oriented x3 with no history of nausea/vomiting/diarrhea.  She had not been complaining of any chest pain or shortness of breath.  In the ED, temperature 98.2 F, HR 103, RR 22, BP 157/79, SPO2 100% on room air.  Sodium 139, potassium 4.5, chloride 107, CO2 25, BUN 18, creatinine 0.70, glucose 90.  AST 23, ALT 8, total bilirubin 0.5.  BNP 239.9.  Lactic acid 1.1.  WBC count 8.2, hemoglobin 9.2, platelets 228.  COVID-19 PCR negative.  Influenza A/B PCR negative.  Valproic acid level 28, subtherapeutic.  Urinalysis with trace leukocytes, negative nitrite, no bacteria, 6-10 WBCs.  Chest x-ray with low lung volumes, otherwise negative for acute cardiopulmonary disease process.  CT head without contrast with no acute intracranial pathology; stable parenchymal volume loss and chronic white matter microangiopathy.  Initial plan in the ED was concern for UTI and subsequently patient had a seizure in the ED.  Patient was loaded with IV Keppra/Depakote.  Neurology was consulted by ED provider who recommended to increase the dose of Depakote to 750 mg p.o. twice daily due to subtherapeutic levels.  TRH consulted for further evaluation and management of acute metabolic encephalopathy secondary to recurrent seizure with subtherapeutic Depakote level and UTI.   Assessment & Plan:   Active Problems:   Hyperlipidemia   Depression   Chronic diastolic CHF (congestive heart failure) (HCC)   Seizure (HCC)   Pressure injury of  skin   Acute metabolic encephalopathy   Acute metabolic encephalopathy: Improving Patient presenting from SNF in which she is bedbound at baseline but usually alert and oriented x3.  Patient was nonverbal.  Initial thought in the ED was this was related to a urinary tract infection and she was started on antibiotics but subsequently developed seizure.  Patient CT head without contrast with no acute abnormality.  Depakote level was subtherapeutic.  Patient remains postictal and confused.  Continue treatment as below. --Started on diet this morning --IVF with D5 half NS at 75 MLS per hour --If does not tolerate diet, will consider SLP evaluation  Recurrent seizure Patient with history of seizure disorder on Depakote 500 mg p.o. twice daily at baseline.  While in the ED, patient was observed to have a recurrent seizure.  CT head without contrast unrevealing.  Patient was given IV load of Keppra 1 g and Depakote 1 g IV.  Case was discussed with on-call neurology who recommended increased dose of Depakote to 750 mg p.o. twice daily.  MR brain 8/19 Motion degraded but no recent infarction, no hemorrhage, no mass, no abnormal enhancement. --Depakote 750 mg IV every 12 hours; will transition to oral dosing once mental status improves and can tolerate oral intake --Seizure precautions --Monitor on telemetry  Enterococcus faecalis urinary tract infection Urinalysis with trace leukocytes, negative nitrite, no bacteria, 6-10 WBCs.  Initially concerned that UTI causing her acute encephalopathy as above.  Previous history of E. coli, Klebsiella, Pseudomonas, Enterococcus, Enterobacter UTI on review of EMR. --Urine culture: Enterococcus faecalis, pending susceptibilities --Change ceftriaxone to ampicillin 500mg  IV q6h  Essential  hypertension --Carvedilol 3.125 mg p.o. twice daily  --Continue aspirin and statin if tolerating oral intake  HLD: Crestor 10 mg p.o. daily, once tolerates oral  intake  Depression/anxiety: Duloxetine 20 mg p.o. qHS   DVT prophylaxis: SCDs Start: 06/29/21 2015   Code Status: DNR Family Communication: No family present at bedside this morning, attempted to update patient's daughter [name] via telephone unsuccessful, went to voicemail.  Message left.  Disposition Plan:  Level of care: Stepdown Status is: Inpatient  Remains inpatient appropriate because:Altered mental status, Unsafe d/c plan, IV treatments appropriate due to intensity of illness or inability to take PO, and Inpatient level of care appropriate due to severity of illness  Dispo: The patient is from: SNF              Anticipated d/c is to: SNF              Patient currently is not medically stable to d/c.   Difficult to place patient No   Consultants:  EDP discussed with on-call neurology  Procedures:  None  Antimicrobials:  Ceftriaxone 8/18 - 8/20 Ampicillin 8/20>>   Subjective: Patient seen examined at bedside, resting comfortably.  Mental status much improved this morning.  Able to repeat her name, date of birth, knows she is at [facility] long hospital.  Hypoglycemic events overnight received glucagon.  Hopefully will improve now that restarting diet.  RN present at bedside.  No other complaints at this time.  No other acute concerns overnight per nursing staff.  Objective: Vitals:   07/01/21 0500 07/01/21 0600 07/01/21 0621 07/01/21 0800  BP: (!) 139/54 (!) 142/58  131/63  Pulse: 70 79 83 (!) 103  Resp: 12 11 16 10   Temp:    97.6 F (36.4 C)  TempSrc:    Oral  SpO2: 100% 100% 100% 100%  Weight:      Height:        Intake/Output Summary (Last 24 hours) at 07/01/2021 1208 Last data filed at 07/01/2021 0900 Gross per 24 hour  Intake 1702.5 ml  Output 750 ml  Net 952.5 ml   Filed Weights   06/30/21 0900  Weight: 62.2 kg    Examination:  General exam: Appears calm and comfortable, slightly confused Respiratory system: Clear to auscultation. Respiratory  effort normal.  On room air Cardiovascular system: S1 & S2 heard, RRR. No JVD, murmurs, rubs, gallops or clicks. No pedal edema. Gastrointestinal system: Abdomen is nondistended, soft and nontender. No organomegaly or masses felt. Normal bowel sounds heard. Central nervous system: Alert and oriented to [facility]), but not time (year 1986), person (she knows her name but thinks 1987 is president), and not situation.  No other focal neurological deficits. Extremities: Moves all extremities independently Skin: No rashes, lesions or ulcers Psychiatry: Judgment and insight appear poor.  Depressed mood/flat affect.    Data Reviewed: I have personally reviewed following labs and imaging studies  CBC: Recent Labs  Lab 06/29/21 1115 06/30/21 0434  WBC 8.2 7.1  NEUTROABS 5.7  --   HGB 9.2* 8.1*  HCT 29.3* 25.7*  MCV 106.2* 104.9*  PLT 228 228   Basic Metabolic Panel: Recent Labs  Lab 06/29/21 1115 06/30/21 0434 06/30/21 2136  NA 139 137  --   K 4.5 4.8  --   CL 107 107  --   CO2 25 24  --   GLUCOSE 90 93 85  BUN 18 20  --   CREATININE 0.70 1.06*  --  CALCIUM 8.7* 8.3*  --    GFR: Estimated Creatinine Clearance: 34.3 mL/min (A) (by C-G formula based on SCr of 1.06 mg/dL (H)). Liver Function Tests: Recent Labs  Lab 06/29/21 1115 06/30/21 0434  AST 23 42*  ALT 8 10  ALKPHOS 51 43  BILITOT 0.5 1.1  PROT 6.9 5.8*  ALBUMIN 2.8* 2.5*   No results for input(s): LIPASE, AMYLASE in the last 168 hours. No results for input(s): AMMONIA in the last 168 hours. Coagulation Profile: No results for input(s): INR, PROTIME in the last 168 hours. Cardiac Enzymes: No results for input(s): CKTOTAL, CKMB, CKMBINDEX, TROPONINI in the last 168 hours. BNP (last 3 results) No results for input(s): PROBNP in the last 8760 hours. HbA1C: No results for input(s): HGBA1C in the last 72 hours. CBG: Recent Labs  Lab 06/30/21 2143 06/30/21 2145 06/30/21 2233 07/01/21 0736  07/01/21 1025  GLUCAP 57* 61* 98 55* 80   Lipid Profile: No results for input(s): CHOL, HDL, LDLCALC, TRIG, CHOLHDL, LDLDIRECT in the last 72 hours. Thyroid Function Tests: No results for input(s): TSH, T4TOTAL, FREET4, T3FREE, THYROIDAB in the last 72 hours. Anemia Panel: No results for input(s): VITAMINB12, FOLATE, FERRITIN, TIBC, IRON, RETICCTPCT in the last 72 hours. Sepsis Labs: Recent Labs  Lab 06/29/21 1119  LATICACIDVEN 1.1    Recent Results (from the past 240 hour(s))  Resp Panel by RT-PCR (Flu A&B, Covid) Nasopharyngeal Swab     Status: None   Collection Time: 06/29/21 11:17 AM   Specimen: Nasopharyngeal Swab; Nasopharyngeal(NP) swabs in vial transport medium  Result Value Ref Range Status   SARS Coronavirus 2 by RT PCR NEGATIVE NEGATIVE Final    Comment: (NOTE) SARS-CoV-2 target nucleic acids are NOT DETECTED.  The SARS-CoV-2 RNA is generally detectable in upper respiratory specimens during the acute phase of infection. The lowest concentration of SARS-CoV-2 viral copies this assay can detect is 138 copies/mL. A negative result does not preclude SARS-Cov-2 infection and should not be used as the sole basis for treatment or other patient management decisions. A negative result may occur with  improper specimen collection/handling, submission of specimen other than nasopharyngeal swab, presence of viral mutation(s) within the areas targeted by this assay, and inadequate number of viral copies(<138 copies/mL). A negative result must be combined with clinical observations, patient history, and epidemiological information. The expected result is Negative.  Fact Sheet for Patients:  BloggerCourse.com  Fact Sheet for Healthcare Providers:  SeriousBroker.it  This test is no t yet approved or cleared by the [place] FDA and  has been authorized for detection and/or diagnosis of SARS-CoV-2 by FDA under an Emergency  Use Authorization (EUA). This EUA will remain  in effect (meaning this test can be used) for the duration of the COVID-19 declaration under Section 564(b)(1) of the Act, 21 U.S.C.section 360bbb-3(b)(1), unless the authorization is terminated  or revoked sooner.       Influenza A by PCR NEGATIVE NEGATIVE Final   Influenza B by PCR NEGATIVE NEGATIVE Final    Comment: (NOTE) The Xpert Xpress SARS-CoV-2/FLU/RSV plus assay is intended as an aid in the diagnosis of influenza from Nasopharyngeal swab specimens and should not be used as a sole basis for treatment. Nasal washings and aspirates are unacceptable for Xpert Xpress SARS-CoV-2/FLU/RSV testing.  Fact Sheet for Patients: BloggerCourse.com  Fact Sheet for Healthcare Providers: SeriousBroker.it  This test is not yet approved or cleared by the [place] FDA and has been authorized for detection and/or diagnosis of SARS-CoV-2 by  FDA under an Emergency Use Authorization (EUA). This EUA will remain in effect (meaning this test can be used) for the duration of the COVID-19 declaration under Section 564(b)(1) of the Act, 21 U.S.C. section 360bbb-3(b)(1), unless the authorization is terminated or revoked.  Performed at [facility], [address], [place], [place] [address]   Urine Culture     Status: Abnormal (Preliminary result)   Collection Time: 06/29/21 11:49 AM   Specimen: Urine, Clean Catch  Result Value Ref Range Status   Specimen Description   Final    URINE, CLEAN CATCH Performed at [facility], [address], [place], [place] [address]    Special Requests   Final    NONE Performed at [facility], [address], [place], [place] [address]    Culture (A)  Final    20,000 COLONIES/mL ENTEROCOCCUS FAECALIS SUSCEPTIBILITIES TO FOLLOW Performed at [facility], [address] [place], [place]  [address]    Report Status PENDING  Incomplete  MRSA Next Gen by PCR, Nasal     Status: Abnormal   Collection Time: 06/30/21  9:02 AM   Specimen: Nasal Mucosa; Nasal Swab  Result Value Ref Range Status   MRSA by PCR Next Gen DETECTED (A) NOT DETECTED Final    Comment: RESULT CALLED TO, READ BACK BY AND VERIFIED WITH: HEAVNER,A. RN  ON 08.19.2022 BY COHEN,K (NOTE) The GeneXpert MRSA Assay (FDA approved for NASAL specimens only), is one component of a comprehensive MRSA colonization surveillance program. It is not intended to diagnose MRSA infection nor to guide or monitor treatment for MRSA infections. Test performance is not FDA approved in patients less than 50 years old. Performed at [facility], [address], [place], [place] [address]          Radiology Studies: MR BRAIN W WO CONTRAST  Result Date: 06/30/2021 CLINICAL DATA:  Mental status change, worsening or persistent EXAM: MRI HEAD WITHOUT AND WITH CONTRAST TECHNIQUE: Multiplanar, multiecho pulse sequences of the brain and surrounding structures were obtained without and with intravenous contrast. CONTRAST:  6mL GADAVIST GADOBUTROL 1 MMOL/ML IV SOLN COMPARISON:  01/09/2021 FINDINGS: Motion artifact is present. Brain: There is no acute infarction or intracranial hemorrhage. There is no intracranial mass, mass effect, or edema. There is no hydrocephalus or extra-axial fluid collection. Prominence of the ventricles and sulci reflecting stable generalized parenchymal volume loss. Patchy and confluent areas of T2 hyperintensity in the supratentorial white matter most compatible with stable chronic microvascular ischemic changes. Small chronic right cerebellar and left corona radiata infarcts. No abnormal enhancement. Vascular: Major vessel flow voids at the skull base are preserved. Skull and upper cervical spine: Normal marrow signal is preserved. Sinuses/Orbits: Patchy mucosal thickening.  Left lens replacement.  Other: Sella is expanded and empty.  Mastoid air cells are clear. IMPRESSION: Degraded by motion artifact. No evidence of recent infarction, hemorrhage, or mass. No abnormal enhancement. Chronic findings are unchanged from the prior study. Electronically Signed   By: Guadlupe Spanish M.D.   On: 06/30/2021 20:17        Scheduled Meds:  acetaminophen  1,000 mg Oral TID   aspirin EC  81 mg Oral Daily   carvedilol  3.125 mg Oral BID   Chlorhexidine Gluconate Cloth  6 each Topical Daily   dextrose  1 ampule Intravenous Once   DULoxetine  20 mg Oral QHS   mouth rinse  15 mL Mouth Rinse BID  mupirocin ointment  1 application Nasal BID   polyethylene glycol  17 g Oral Once per day on Sun Mon Wed Fri   rosuvastatin  10 mg Oral QHS   senna-docusate  2 tablet Oral BID   thiamine injection  100 mg Intravenous Daily   Continuous Infusions:  ampicillin (OMNIPEN) IV     dextrose 5 % and 0.45% NaCl 75 mL/hr at 07/01/21 0319   valproate sodium Stopped (07/01/21 1112)     LOS: 1 day    Time spent: 38 minutes spent on chart review, discussion with nursing staff, consultants, updating family and interview/physical exam; more than 50% of that time was spent in counseling and/or coordination of care.    Alvira Philips [place], DO [facility] Available via Epic secure chat 7am-7pm After these hours, please refer to coverage provider listed on [email or web address] 07/01/2021, 12:08 PM

## 2021-07-02 LAB — BASIC METABOLIC PANEL
Anion gap: 7 (ref 5–15)
BUN: 17 mg/dL (ref 8–23)
CO2: 22 mmol/L (ref 22–32)
Calcium: 8.1 mg/dL — ABNORMAL LOW (ref 8.9–10.3)
Chloride: 111 mmol/L (ref 98–111)
Creatinine, Ser: 0.83 mg/dL (ref 0.44–1.00)
GFR, Estimated: >60 mL/min (ref >60)
Glucose, Bld: 94 mg/dL (ref 70–99)
Potassium: 3.7 mmol/L (ref 3.5–5.1)
Sodium: 140 mmol/L (ref 135–145)

## 2021-07-02 LAB — GLUCOSE, CAPILLARY
Glucose-Capillary: 89 mg/dL (ref 70–99)
Glucose-Capillary: 82 mg/dL (ref 70–99)
Glucose-Capillary: 80 mg/dL (ref 70–99)
Glucose-Capillary: 82 mg/dL (ref 70–99)

## 2021-07-02 MED ORDER — HYDRALAZINE HCL 25 MG PO TABS
25.0000 mg | ORAL_TABLET | Freq: Four times a day (QID) | ORAL | Status: DC | PRN
  Administered 2021-07-02: 25 mg via ORAL
  Filled 2021-07-02: qty 1

## 2021-07-02 MED ORDER — THIAMINE HCL 100 MG PO TABS
100.0000 mg | ORAL_TABLET | Freq: Every day | ORAL | Status: DC
  Administered 2021-07-03: 100 mg via ORAL
  Filled 2021-07-02: qty 1

## 2021-07-02 NOTE — Progress Notes (Signed)
   07/02/21 1811  Assess: MEWS Score  Temp 98 F (36.7 C)  BP (!) 204/73  Pulse Rate 70  Resp 17  SpO2 98 %  O2 Device Room Air  Assess: MEWS Score  MEWS Temp 0  MEWS Systolic 2  MEWS Pulse 0  MEWS RR 0  MEWS LOC 0  MEWS Score 2  MEWS Score Color Yellow  Assess: if the MEWS score is Yellow or Red  Were vital signs taken at a resting state? Yes  Focused Assessment Change from prior assessment (see assessment flowsheet)  Does the patient meet 2 or more of the SIRS criteria? No  MEWS guidelines implemented *See Row Information* Yes  Treat  MEWS Interventions Escalated (See documentation below)  Take Vital Signs  Increase Vital Sign Frequency  Yellow: Q 2hr X 2 then Q 4hr X 2, if remains yellow, continue Q 4hrs  Escalate  MEWS: Escalate Yellow: discuss with charge nurse/RN and consider discussing with provider and RRT  Notify: Charge Nurse/RN  Name of Charge Nurse/RN Notified Shearon Stalls RN  Date Charge Nurse/RN Notified 07/02/21  Time Charge Nurse/RN Notified 1837  Notify: Provider  Provider Name/Title Dr [place]  Date Provider Notified 07/02/21  Time Provider Notified 1819  Notification Type  (secure chat)  Notification Reason Critical result  Provider response See new orders  Date of Provider Response 07/02/21  Time of Provider Response 1823  Assess: SIRS CRITERIA  SIRS Temperature  0  SIRS Pulse 0  SIRS Respirations  0  SIRS WBC 0  SIRS Score Sum  0

## 2021-07-02 NOTE — Progress Notes (Signed)
PROGRESS NOTE    [name]  ZOX:[id number] DOB: 03-29-1933 DOA: 06/29/2021 PCP: Tresa Garter, MD    Brief Narrative:  [name] is an 87 year old female with past medical history significant for seizure disorder, CAD s/p stent x2, rheumatoid arthritis, essential hypertension, hyperlipidemia, depression, iron deficiency anemia, osteoporosis, bedbound with total care at baseline who presents to [facility] ED from [facility] with confusion.  Per daughter, usually alert and oriented x3 with no history of nausea/vomiting/diarrhea.  She had not been complaining of any chest pain or shortness of breath.  In the ED, temperature 98.2 F, HR 103, RR 22, BP 157/79, SPO2 100% on room air.  Sodium 139, potassium 4.5, chloride 107, CO2 25, BUN 18, creatinine 0.70, glucose 90.  AST 23, ALT 8, total bilirubin 0.5.  BNP 239.9.  Lactic acid 1.1.  WBC count 8.2, hemoglobin 9.2, platelets 228.  COVID-19 PCR negative.  Influenza A/B PCR negative.  Valproic acid level 28, subtherapeutic.  Urinalysis with trace leukocytes, negative nitrite, no bacteria, 6-10 WBCs.  Chest x-ray with low lung volumes, otherwise negative for acute cardiopulmonary disease process.  CT head without contrast with no acute intracranial pathology; stable parenchymal volume loss and chronic white matter microangiopathy.  Initial plan in the ED was concern for UTI and subsequently patient had a seizure in the ED.  Patient was loaded with IV Keppra/Depakote.  Neurology was consulted by ED provider who recommended to increase the dose of Depakote to 750 mg p.o. twice daily due to subtherapeutic levels.  TRH consulted for further evaluation and management of acute metabolic encephalopathy secondary to recurrent seizure with subtherapeutic Depakote level and UTI.   Assessment & Plan:   Active Problems:   Hyperlipidemia   Depression   Chronic diastolic CHF (congestive heart failure) (HCC)   Seizure (HCC)   Pressure injury of  skin   Acute metabolic encephalopathy   Acute metabolic encephalopathy: Resolved Patient presenting from SNF in which she is bedbound at baseline but usually alert and oriented x3.  Patient was nonverbal.  Initial thought in the ED was this was related to a urinary tract infection and she was started on antibiotics but subsequently developed seizure.  Patient CT head without contrast with no acute abnormality.  Depakote level was subtherapeutic.  Patient's postictal state has now resolved and appears back to her normal baseline.  Continue treatment as below.  Recurrent seizure Patient with history of seizure disorder on Depakote 500 mg p.o. twice daily at baseline.  While in the ED, patient was observed to have a recurrent seizure.  CT head without contrast unrevealing.  Patient was given IV load of Keppra 1 g and Depakote 1 g IV.  Case was discussed with on-call neurology who recommended increased dose of Depakote to 750 mg p.o. twice daily.  MR brain 8/19 Motion degraded but no recent infarction, no hemorrhage, no mass, no abnormal enhancement. --Depakote 750 mg PO q12h --Seizure precautions --Monitor on telemetry  Enterococcus faecalis urinary tract infection Urinalysis with trace leukocytes, negative nitrite, no bacteria, 6-10 WBCs.  Initially concerned that UTI causing her acute encephalopathy as above.  Previous history of E. coli, Klebsiella, Pseudomonas, Enterococcus, Enterobacter UTI on review of EMR. --Urine culture: Enterococcus faecalis, pending susceptibilities --Changed ceftriaxone to ampicillin 1g IV q8h on 8/20  Essential hypertension --Carvedilol 3.125 mg p.o. twice daily  --Continue aspirin and statin   HLD: Crestor 10 mg p.o. daily, once tolerates oral intake  Depression/anxiety: Duloxetine 20 mg p.o.  qHS   DVT prophylaxis: SCDs Start: 06/29/21 2015   Code Status: DNR Family Communication: No family present at bedside this morning,   Disposition Plan:  Level of  care: Telemetry Status is: Inpatient  Remains inpatient appropriate because:Altered mental status, Unsafe d/c plan, IV treatments appropriate due to intensity of illness or inability to take PO, and Inpatient level of care appropriate due to severity of illness  Dispo: The patient is from: SNF              Anticipated d/c is to: SNF              Patient currently is not medically stable to d/c.   Difficult to place patient No   Consultants:  EDP discussed with on-call neurology  Procedures:  None  Antimicrobials:  Ceftriaxone 8/18 - 8/20 Ampicillin 8/20>>   Subjective: Patient seen examined at bedside, resting comfortably.  Mental status appears back to her normal baseline.  Eating well.  Updated patient's daughter, [name] via telephone this morning. No other complaints at this time.  No other acute concerns overnight per nursing staff.  Anticipate discharge back to [facility].  Objective: Vitals:   07/02/21 0600 07/02/21 0730 07/02/21 0909 07/02/21 1000  BP: (!) 145/64  (!) 167/63 (!) 145/59  Pulse: 77  76 86  Resp: Temp:  98.2 F (36.8 C)    TempSrc:  Oral    SpO2: 100%  100% 98%  Weight:      Height:        Intake/Output Summary (Last 24 hours) at 07/02/2021 1026 Last data filed at 07/02/2021 0900 Gross per 24 hour  Intake 2367.17 ml  Output 1125 ml  Net 1242.17 ml   Filed Weights   06/30/21 0900  Weight: 62.2 kg    Examination:  General exam: Appears calm and comfortable, slightly confused Respiratory system: Clear to auscultation. Respiratory effort normal.  On room air Cardiovascular system: S1 & S2 heard, RRR. No JVD, murmurs, rubs, gallops or clicks. No pedal edema. Gastrointestinal system: Abdomen is nondistended, soft and nontender. No organomegaly or masses felt. Normal bowel sounds heard. Central nervous system: Alert and oriented to [facility]), but not time (year 1986), person (she knows her name but thinks [name]  is president), and not situation.  No other focal neurological deficits. Extremities: Moves all extremities independently Skin: No rashes, lesions or ulcers Psychiatry: Judgment and insight appear poor.  Depressed mood/flat affect.    Data Reviewed: I have personally reviewed following labs and imaging studies  CBC: Recent Labs  Lab 06/29/21 1115 06/30/21 0434  WBC 8.2 7.1  NEUTROABS 5.7  --   HGB 9.2* 8.1*  HCT 29.3* 25.7*  MCV 106.2* 104.9*  PLT 228 228   Basic Metabolic Panel: Recent Labs  Lab 06/29/21 1115 06/30/21 0434 06/30/21 2136 07/02/21 0255  NA 139 137  --  140  K 4.5 4.8  --  3.7  CL 107 107  --  111  CO2 25 24  --  22  GLUCOSE 90 93 85 94  BUN 18 20  --  17  CREATININE 0.70 1.06*  --  0.83  CALCIUM 8.7* 8.3*  --  8.1*   GFR: Estimated Creatinine Clearance: 43.8 mL/min (by C-G formula based on SCr of 0.83 mg/dL). Liver Function Tests: Recent Labs  Lab 06/29/21 1115 06/30/21 0434  AST 23 42*  ALT 8 10  ALKPHOS 51 43  BILITOT 0.5 1.1  PROT 6.9 5.8*  ALBUMIN 2.8* 2.5*   No results for input(s): LIPASE, AMYLASE in the last 168 hours. No results for input(s): AMMONIA in the last 168 hours. Coagulation Profile: No results for input(s): INR, PROTIME in the last 168 hours. Cardiac Enzymes: No results for input(s): CKTOTAL, CKMB, CKMBINDEX, TROPONINI in the last 168 hours. BNP (last 3 results) No results for input(s): PROBNP in the last 8760 hours. HbA1C: No results for input(s): HGBA1C in the last 72 hours. CBG: Recent Labs  Lab 07/01/21 1653 07/01/21 1946 07/01/21 2330 07/02/21 0321 07/02/21 0737  GLUCAP 101* 99 119* 89 82   Lipid Profile: No results for input(s): CHOL, HDL, LDLCALC, TRIG, CHOLHDL, LDLDIRECT in the last 72 hours. Thyroid Function Tests: No results for input(s): TSH, T4TOTAL, FREET4, T3FREE, THYROIDAB in the last 72 hours. Anemia Panel: No results for input(s): VITAMINB12, FOLATE, FERRITIN, TIBC, IRON, RETICCTPCT in the  last 72 hours. Sepsis Labs: Recent Labs  Lab 06/29/21 1119  LATICACIDVEN 1.1    Recent Results (from the past 240 hour(s))  Resp Panel by RT-PCR (Flu A&B, Covid) Nasopharyngeal Swab     Status: None   Collection Time: 06/29/21 11:17 AM   Specimen: Nasopharyngeal Swab; Nasopharyngeal(NP) swabs in vial transport medium  Result Value Ref Range Status   SARS Coronavirus 2 by RT PCR NEGATIVE NEGATIVE Final    Comment: (NOTE) SARS-CoV-2 target nucleic acids are NOT DETECTED.  The SARS-CoV-2 RNA is generally detectable in upper respiratory specimens during the acute phase of infection. The lowest concentration of SARS-CoV-2 viral copies this assay can detect is 138 copies/mL. A negative result does not preclude SARS-Cov-2 infection and should not be used as the sole basis for treatment or other patient management decisions. A negative result may occur with  improper specimen collection/handling, submission of specimen other than nasopharyngeal swab, presence of viral mutation(s) within the areas targeted by this assay, and inadequate number of viral copies(<138 copies/mL). A negative result must be combined with clinical observations, patient history, and epidemiological information. The expected result is Negative.  Fact Sheet for Patients:  BloggerCourse.com  Fact Sheet for Healthcare Providers:  SeriousBroker.it  This test is no t yet approved or cleared by the [place] FDA and  has been authorized for detection and/or diagnosis of SARS-CoV-2 by FDA under an Emergency Use Authorization (EUA). This EUA will remain  in effect (meaning this test can be used) for the duration of the COVID-19 declaration under Section 564(b)(1) of the Act, 21 U.S.C.section 360bbb-3(b)(1), unless the authorization is terminated  or revoked sooner.       Influenza A by PCR NEGATIVE NEGATIVE Final   Influenza B by PCR NEGATIVE NEGATIVE Final     Comment: (NOTE) The Xpert Xpress SARS-CoV-2/FLU/RSV plus assay is intended as an aid in the diagnosis of influenza from Nasopharyngeal swab specimens and should not be used as a sole basis for treatment. Nasal washings and aspirates are unacceptable for Xpert Xpress SARS-CoV-2/FLU/RSV testing.  Fact Sheet for Patients: BloggerCourse.com  Fact Sheet for Healthcare Providers: SeriousBroker.it  This test is not yet approved or cleared by the [place] FDA and has been authorized for detection and/or diagnosis of SARS-CoV-2 by FDA under an Emergency Use Authorization (EUA). This EUA will remain in effect (meaning this test can be used) for the duration of the COVID-19 declaration under Section 564(b)(1) of the Act, 21 U.S.C. section 360bbb-3(b)(1), unless the authorization is terminated or revoked.  Performed at [facility], [address] [place], [place] [address]   Urine Culture     Status: Abnormal (Preliminary result)   Collection Time: 06/29/21 11:49 AM   Specimen: Urine, Clean Catch  Result Value Ref Range Status   Specimen Description   Final    URINE, CLEAN CATCH Performed at [facility], [address], [place], [place] [address]    Special Requests   Final    NONE Performed at [facility], [address], [place], [place] [address]    Culture (A)  Final    20,000 COLONIES/mL ENTEROCOCCUS FAECALIS REPEATING SUSCEPTIBILITY Performed at [facility], [address] [place], [place] [address]    Report Status PENDING  Incomplete  MRSA Next Gen by PCR, Nasal     Status: Abnormal   Collection Time: 06/30/21  9:02 AM   Specimen: Nasal Mucosa; Nasal Swab  Result Value Ref Range Status   MRSA by PCR Next Gen DETECTED (A) NOT DETECTED Final    Comment: RESULT CALLED TO, READ BACK BY AND VERIFIED WITH: HEAVNER,A. RN @1405  ON 08.19.2022 BY  COHEN,K (NOTE) The GeneXpert MRSA Assay (FDA approved for NASAL specimens only), is one component of a comprehensive MRSA colonization surveillance program. It is not intended to diagnose MRSA infection nor to guide or monitor treatment for MRSA infections. Test performance is not FDA approved in patients less than 71 years old. Performed at [facility], [address], [place], [place]          Radiology Studies: MR BRAIN W WO CONTRAST  Result Date: 06/30/2021 CLINICAL DATA:  Mental status change, worsening or persistent EXAM: MRI HEAD WITHOUT AND WITH CONTRAST TECHNIQUE: Multiplanar, multiecho pulse sequences of the brain and surrounding structures were obtained without and with intravenous contrast. CONTRAST:  61mL GADAVIST GADOBUTROL 1 MMOL/ML IV SOLN COMPARISON:  01/09/2021 FINDINGS: Motion artifact is present. Brain: There is no acute infarction or intracranial hemorrhage. There is no intracranial mass, mass effect, or edema. There is no hydrocephalus or extra-axial fluid collection. Prominence of the ventricles and sulci reflecting stable generalized parenchymal volume loss. Patchy and confluent areas of T2 hyperintensity in the supratentorial white matter most compatible with stable chronic microvascular ischemic changes. Small chronic right cerebellar and left corona radiata infarcts. No abnormal enhancement. Vascular: Major vessel flow voids at the skull base are preserved. Skull and upper cervical spine: Normal marrow signal is preserved. Sinuses/Orbits: Patchy mucosal thickening.  Left lens replacement. Other: Sella is expanded and empty.  Mastoid air cells are clear. IMPRESSION: Degraded by motion artifact. No evidence of recent infarction, hemorrhage, or mass. No abnormal enhancement. Chronic findings are unchanged from the prior study. Electronically Signed   By: 01/11/2021 M.D.   On: 06/30/2021 20:17        Scheduled Meds:  acetaminophen   1,000 mg Oral TID   aspirin EC  81 mg Oral Daily   carvedilol  3.125 mg Oral BID   Chlorhexidine Gluconate Cloth  6 each Topical Daily   dextrose  1 ampule Intravenous Once   divalproex  750 mg Oral Q12H   DULoxetine  20 mg Oral QHS   mouth rinse  15 mL Mouth Rinse BID   mupirocin ointment  1 application Nasal BID   polyethylene glycol  17 g Oral Once per day on Sun Mon Wed Fri   rosuvastatin  10 mg Oral QHS   senna-docusate  2 tablet Oral BID   thiamine injection  100 mg  Intravenous Daily   Continuous Infusions:  ampicillin (OMNIPEN) IV Stopped (07/02/21 0528)   dextrose 5 % and 0.45% NaCl 75 mL/hr at 07/02/21 0731     LOS: 2 days    Time spent: 36 minutes spent on chart review, discussion with nursing staff, consultants, updating family and interview/physical exam; more than 50% of that time was spent in counseling and/or coordination of care.    Alvira Philips [place], DO [facility] Available via Epic secure chat 7am-7pm After these hours, please refer to coverage provider listed on [email or web address] 07/02/2021, 10:26 AM

## 2021-07-03 DIAGNOSIS — E785 Hyperlipidemia, unspecified: Secondary | ICD-10-CM

## 2021-07-03 DIAGNOSIS — L89152 Pressure ulcer of sacral region, stage 2: Secondary | ICD-10-CM

## 2021-07-03 LAB — URINE CULTURE: Culture: 20000 — AB

## 2021-07-03 LAB — GLUCOSE, CAPILLARY: Glucose-Capillary: 79 mg/dL (ref 70–99)

## 2021-07-03 MED ORDER — AMOXICILLIN 500 MG PO TABS: 500.0000 mg | ORAL_TABLET | Freq: Two times a day (BID) | ORAL | 0 refills | Status: AC

## 2021-07-03 MED ORDER — AMLODIPINE BESYLATE 5 MG PO TABS
5.0000 mg | ORAL_TABLET | Freq: Every day | ORAL | Status: DC
  Administered 2021-07-03: 5 mg via ORAL
  Filled 2021-07-03: qty 1

## 2021-07-03 MED ORDER — DIVALPROEX SODIUM 125 MG PO DR TAB: 750.0000 mg | DELAYED_RELEASE_TABLET | Freq: Two times a day (BID) | ORAL | Status: AC

## 2021-07-03 MED ORDER — AMLODIPINE BESYLATE 5 MG PO TABS: 5.0000 mg | ORAL_TABLET | Freq: Every day | ORAL | Status: AC

## 2021-07-03 NOTE — Care Management Important Message (Signed)
Important Message  Patient Details IM Letter placed in the Patient's room. Name: [name] MRN: [id number] Date of Birth: Jul 03, 1933   Medicare Important Message Given:  Yes     Caren Macadam 07/03/2021, 1:25 PM

## 2021-07-03 NOTE — NC FL2 (Signed)
[place] MEDICAID FL2 LEVEL OF CARE SCREENING TOOL     IDENTIFICATION  Patient Name: [name] Birthdate: 1933/02/13 Sex: female Admission Date (Current Location): 06/29/2021  [facility] and [place] Number:  Producer, television/film/video and Address:  [facility],  [address] [place], [place] [address]      Provider Number: [id number]  Attending Physician Name and Address:  [place], Eric J, DO  Relative Name and Phone Number:  [name] dtr [phone]    Current Level of Care: Hospital Recommended Level of Care: Nursing Facility Prior Approval Number:    Date Approved/Denied:   PASRR Number:    Discharge Plan: SNF    Current Diagnoses: Patient Active Problem List   Diagnosis Date Noted   Pressure injury of skin 06/30/2021   Seizure (HCC) 06/29/2021   Anemia 03/31/2021   Palpitations 01/08/2021   AMS (altered mental status) 01/08/2021   History of seizure 01/08/2021   S/P primary angioplasty with coronary stent 01/08/2021   Chronic diastolic CHF (congestive heart failure) (HCC) 01/08/2021   CKD (chronic kidney disease) stage 3, GFR 30-59 ml/min (HCC) 08/29/2020   Depression 08/29/2020   COVID-19 virus infection 02/05/2020   Unstable angina (HCC) 10/16/2019   Non-ST elevation (NSTEMI) myocardial infarction (HCC) 10/16/2019   Nausea 06/23/2018   CHF (congestive heart failure) (HCC) 04/03/2018   AKI (acute kidney injury) (HCC) 11/20/2017   Dehydration    Generalized weakness 11/18/2017   Second degree burn of foot 11/18/2017   Seizures (HCC) 07/17/2016   Hypertensive urgency 07/17/2016   Acute diastolic CHF (congestive heart failure) (HCC) 07/17/2016   UTI (urinary tract infection) 02/28/2016   Urinary incontinence 10/28/2015   Alopecia 08/12/2015   TIA (transient ischemic attack) 07/01/2015   Acute encephalopathy 06/24/2015   Protein-calorie malnutrition, severe (HCC) 04/19/2015   Fever    Abdominal abscess    Pressure ulcer 04/07/2015   Acute kidney  injury (HCC)    Sepsis (HCC)    Stroke (HCC)    Facial droop    Confusion 03/26/2015   Malnutrition of moderate degree (HCC) 03/20/2015   Small bowel obstruction s/p exlap/LOA/decompression 03/25/2015 03/18/2015   Essential hypertension 03/18/2015   Noncompliance with medication regimen 12/07/2014   Fall against object 06/24/2014   Contusion of left hand 06/24/2014   Contusion of left hip 06/24/2014   Loss of weight 04/12/2014   Malignant hypertension with renal failure and congestive heart failure (HCC) 09/23/2013   Knee pain, bilateral 07/27/2013   Chronic fatigue disorder 01/19/2013   Vaginitis due to Candida 09/02/2012   Sinusitis 09/02/2012   Anemia in other chronic diseases classified elsewhere 09/02/2012   Polymyalgia rheumatica (HCC) 09/10/2011   Vertigo 08/21/2011   Fatigue 05/11/2011   ANEMIA OF CHRONIC DISEASE 09/06/2010   KNEE PAIN, CHRONIC 05/26/2010   DIZZINESS 05/11/2010   SHOULDER PAIN 04/25/2010   FREQUENCY, URINARY 03/16/2010   CONSTIPATION, CHRONIC 01/17/2010   FOOT PAIN 01/17/2009   Rash and other nonspecific skin eruption 01/17/2009   TINNITUS NOS 10/18/2008   B12 deficiency 08/10/2008   LOW BACK PAIN 06/23/2008   CHEST WALL PAIN 06/23/2008   Dysuria 03/18/2008   Abdominal pain 03/18/2008   Hyperlipidemia 12/24/2007   DYSPNEA 12/24/2007   Anxiety disorder 12/07/2007   GERD 12/07/2007   CHEST PAIN 12/07/2007   OTHER SPEC FORMS CHRONIC ISCHEMIC HEART DISEASE 12/01/2007   Type 2 diabetes mellitus with diabetic nephropathy, without long-term current use of insulin (HCC) 11/28/2007   Pain in joint 11/28/2007  Gout 09/17/2007   Adjustment disorder with mixed anxiety and depressed mood 09/17/2007   Coronary atherosclerosis 09/17/2007   Disorder resulting from impaired renal function 09/17/2007   OSTEOPOROSIS 09/17/2007   DVT, HX OF 09/17/2007   PANCREATITIS, HX OF 09/17/2007    Orientation RESPIRATION BLADDER Height & Weight     Self, Situation   Normal Incontinent Weight: 62.2 kg Height:  5\' 5"  (165.1 cm)  BEHAVIORAL SYMPTOMS/MOOD NEUROLOGICAL BOWEL NUTRITION STATUS      Incontinent Diet  AMBULATORY STATUS COMMUNICATION OF NEEDS Skin   Extensive Assist Verbally PU Stage and Appropriate Care   PU Stage 2 Dressing: Daily                   Personal Care Assistance Level of Assistance  Bathing, Feeding, Dressing, Total care Bathing Assistance: Maximum assistance Feeding assistance: Maximum assistance Dressing Assistance: Maximum assistance Total Care Assistance: Maximum assistance   Functional Limitations Info  Sight, Hearing, Speech Sight Info: Impaired Hearing Info: Adequate Speech Info: Adequate    SPECIAL CARE FACTORS FREQUENCY                       Contractures Contractures Info: Not present    Additional Factors Info  Code Status, Allergies Code Status Info:  (DNR) Allergies Info:  (See H&P 21 allergies)           Current Medications (07/03/2021):  This is the current hospital active medication list Current Facility-Administered Medications  Medication Dose Route Frequency Provider Last Rate Last Admin   acetaminophen (TYLENOL) suppository 650 mg  650 mg Rectal Q8H PRN 07/05/2021, NP       acetaminophen (TYLENOL) tablet 1,000 mg  1,000 mg Oral TID Luiz Iron, MD   1,000 mg at 07/03/21 1010   amLODipine (NORVASC) tablet 5 mg  5 mg Oral Daily 07/05/21, [place], DO   5 mg at 07/03/21 1011   ampicillin (OMNIPEN) 1 g in sodium chloride 0.9 % 100 mL IVPB  1 g Intravenous Q8H 07/05/21, [place], DO 300 mL/hr at 07/03/21 0548 1 g at 07/03/21 0548   aspirin EC tablet 81 mg  81 mg Oral Daily 07/05/21, MD   81 mg at 07/03/21 1012   carvedilol (COREG) tablet 3.125 mg  3.125 mg Oral BID 07/05/21, Eric J, DO   3.125 mg at 07/03/21 1011   Chlorhexidine Gluconate Cloth 2 % PADS 6 each  6 each Topical Daily 07/05/21, Eric J, DO   6 each at 07/03/21 1012   dextrose 50 % solution 50 mL  1 ampule Intravenous  Once 07/05/21, Eric J, DO       divalproex (DEPAKOTE) DR tablet 750 mg  750 mg Oral Q12H 07-21-1976, [place], DO   750 mg at 07/03/21 1012   DULoxetine (CYMBALTA) DR capsule 20 mg  20 mg Oral QHS 07/05/21, MD   20 mg at 07/02/21 2220   hydrALAZINE (APRESOLINE) tablet 25 mg  25 mg Oral Q6H PRN 2221, Eric J, DO   25 mg at 07/02/21 1859   MEDLINE mouth rinse  15 mL Mouth Rinse BID 07/04/21, Eric J, DO   15 mL at 07/03/21 1012   mupirocin ointment (BACTROBAN) 2 % 1 application  1 application Nasal BID 07/05/21, Eric J, DO   1 application at 07/03/21 1012   ondansetron (ZOFRAN) tablet 4 mg  4 mg Oral Q6H PRN 07/05/21, MD  Or   ondansetron (ZOFRAN) injection 4 mg  4 mg Intravenous Q6H PRN Sharl Ma, Sarina Ill, MD       polyethylene glycol (MIRALAX / GLYCOLAX) packet 17 g  17 g Oral Once per day on Sun Mon Wed Fri Meredeth Ide, MD   17 g at 07/02/21 2227   rosuvastatin (CRESTOR) tablet 10 mg  10 mg Oral QHS Meredeth Ide, MD   10 mg at 07/02/21 2217   senna-docusate (Senokot-S) tablet 2 tablet  2 tablet Oral BID Meredeth Ide, MD   2 tablet at 07/03/21 1009   thiamine tablet 100 mg  100 mg Oral Daily Shade, Jacqulyn Cane, RPH   100 mg at 07/03/21 1009     Discharge Medications: Please see discharge summary for a list of discharge medications.  Relevant Imaging Results:  Relevant Lab Results:   Additional Information SSN: [id number]  Hance Caspers, Olegario Messier, RN

## 2021-07-03 NOTE — TOC Transition Note (Signed)
Transition of Care [facility]) - CM/SW Discharge Note   Patient Details  Name: [name] MRN: [id number] Date of Birth: 04/01/1933  Transition of Care [facility]) CM/SW Contact:  Lanier Clam, RN Phone Number: 07/03/2021, 11:39 AM   Clinical Narrative:  d/c back to [facility] rep Lawerance Cruel accepted back-PTAR forms at nsg station-MD aware to sign DNR in shadow chart.going to rm#[id number] A-nsg callr eport tel#[phone]. PTAR called. No further CM needs.     Final next level of care: Skilled Nursing Facility Barriers to Discharge: No Barriers Identified   Patient Goals and CMS Choice Patient states their goals for this hospitalization and ongoing recovery are:: return back to [facility] Medicare.gov Compare Post Acute Care list provided to:: Patient Represenative (must comment) Choice offered to / list presented to : Adult Children  Discharge Placement                       Discharge Plan and Services   Discharge Planning Services: CM Consult Post Acute Care Choice: Nursing Home                               Social Determinants of Health (SDOH) Interventions     Readmission Risk Interventions Readmission Risk Prevention Plan 02/08/2020  Transportation Screening Complete  Medication Review (RN Care Manager) Complete  PCP or Specialist appointment within 3-5 days of discharge Complete  HRI or Home Care Consult Complete  SW Recovery Care/Counseling Consult Complete  Palliative Care Screening Not Applicable  Skilled Nursing Facility Complete  Some recent data might be hidden

## 2021-07-03 NOTE — Discharge Summary (Addendum)
Physician Discharge Summary  [name] UJW:[id number] DOB: 1933-08-26 DOA: 06/29/2021  PCP: Tresa Garter, MD  Admit date: 06/29/2021 Discharge date: 07/03/2021  Admitted From: [name] Disposition: [name]  Recommendations for Outpatient Follow-up:  Follow up with PCP in 1-2 weeks Continue amoxicillin 500 mg p.o. twice daily to complete 7-day course for Enterococcus faecalis UTI Depakote increased to 750 mg p.o. twice daily for recurrent seizure with subtherapeutic Depakote level; recommend repeat Depakote level in 2 weeks. Started on amlodipine 5 mg p.o. daily for poorly controlled hypertension, please continue to monitor BP and adjust as needed. Please ensure patient is being changed frequently and not lying in wetness as this is likely factor in her recurrent UTIs as patient is bedbound at baseline   Discharge Condition: Stable CODE STATUS: DNR Diet recommendation: Dysphagia 3 diet  History of present illness:  [name] is an 87 year old female with past medical history significant for seizure disorder, CAD s/p stent x2, rheumatoid arthritis, essential hypertension, hyperlipidemia, depression, iron deficiency anemia, osteoporosis, bedbound with total care at baseline who presents to [facility] ED from [facility] with confusion.  Per daughter, usually alert and oriented x3 with no history of nausea/vomiting/diarrhea.  She had not been complaining of any chest pain or shortness of breath.   In the ED, temperature 98.2 F, HR 103, RR 22, BP 157/79, SPO2 100% on room air.  Sodium 139, potassium 4.5, chloride 107, CO2 25, BUN 18, creatinine 0.70, glucose 90.  AST 23, ALT 8, total bilirubin 0.5.  BNP 239.9.  Lactic acid 1.1.  WBC count 8.2, hemoglobin 9.2, platelets 228.  COVID-19 PCR negative.  Influenza A/B PCR negative.  Valproic acid level 28, subtherapeutic.  Urinalysis with trace leukocytes, negative nitrite, no bacteria, 6-10 WBCs.  Chest x-ray with  low lung volumes, otherwise negative for acute cardiopulmonary disease process.  CT head without contrast with no acute intracranial pathology; stable parenchymal volume loss and chronic white matter microangiopathy.  Initial plan in the ED was concern for UTI and subsequently patient had a seizure in the ED.  Patient was loaded with IV Keppra/Depakote.  Neurology was consulted by ED provider who recommended to increase the dose of Depakote to 750 mg p.o. twice daily due to subtherapeutic levels.  TRH consulted for further evaluation and management of acute metabolic encephalopathy secondary to recurrent seizure with subtherapeutic Depakote level and UTI.  Hospital course:  Acute metabolic encephalopathy: Resolved Patient presenting from SNF in which she is bedbound at baseline but usually alert and oriented x3.  Patient was nonverbal.  Initial thought in the ED was this was related to a urinary tract infection and she was started on antibiotics but subsequently developed seizure.  Patient CT head without contrast with no acute abnormality.  Depakote level was subtherapeutic.  Patient's postictal state has now resolved and appears back to her normal baseline.  Continue treatment as below.   Recurrent seizure Patient with history of seizure disorder on Depakote 500 mg p.o. twice daily at baseline.  While in the ED, patient was observed to have a recurrent seizure.  CT head without contrast unrevealing.  Patient was given IV load of Keppra 1 g and Depakote 1 g IV.  Case was discussed with on-call neurology who recommended increased dose of Depakote to 750 mg p.o. twice daily.  MR brain 8/19 Motion degraded but no recent infarction, no hemorrhage, no mass, no abnormal enhancement.  Outpatient follow-up with PCP/neurology.  Recommend repeat  Depakote level in 2 weeks.   Enterococcus faecalis urinary tract infection Urinalysis with trace leukocytes, negative nitrite, no bacteria, 6-10 WBCs.  Initially concerned  that UTI causing her acute encephalopathy as above.  Urine culture positive for Enterococcus faecalis with pan sensitivities.  Initially started on ceftriaxone followed by ampicillin and will continue antibiotics with amoxicillin 500 mg p.o. twice daily to complete 7-day course on discharge.  Please ensure patient is clean and dry at all times as this is likely contributing factor to her recurrent UTIs.   Essential hypertension Carvedilol 3.125 mg p.o. twice daily, started on amlodipine 5 mg p.o. daily.  Continue aspirin and statin.  Needs close monitoring of her blood pressure and further adjustments as needed.   HLD: Crestor 10 mg p.o. daily, once tolerates oral intake   Depression/anxiety: Duloxetine 20 mg p.o. qHS  Stage II sacral ulcer, POA Pressure Injury 06/30/21 Sacrum Medial Stage 2 -  Partial thickness loss of dermis presenting as a shallow open injury with a red, pink wound bed without slough. (Active)  06/30/21 0913  Location: Sacrum  Location Orientation: Medial  Staging: Stage 2 -  Partial thickness loss of dermis presenting as a shallow open injury with a red, pink wound bed without slough.  Wound Description (Comments):   Present on Admission: Yes  Continue positional changes every 2 hours, ensure patient is dry at all times as she is bedbound at baseline.  Continue local wound care.      Discharge Diagnoses:  Active Problems:   Hyperlipidemia   Depression   Chronic diastolic CHF (congestive heart failure) (HCC)   Seizure (HCC)   Pressure injury of skin    Discharge Instructions  Discharge Instructions     Call MD for:  difficulty breathing, headache or visual disturbances   Complete by: As directed    Call MD for:  extreme fatigue   Complete by: As directed    Call MD for:  persistant dizziness or light-headedness   Complete by: As directed    Call MD for:  persistant nausea and vomiting   Complete by: As directed    Call MD for:  severe uncontrolled pain    Complete by: As directed    Call MD for:  temperature >100.4   Complete by: As directed    Diet - low sodium heart healthy   Complete by: As directed    Increase activity slowly   Complete by: As directed    No wound care   Complete by: As directed       Allergies as of 07/03/2021       Reactions   Ativan [lorazepam] Other (See Comments)   Very confused   Tramadol Hcl Anxiety, Rash   Headache   Amlodipine Besylate Other (See Comments)    dizzy   Atenolol Other (See Comments)   Fatigue   Benazepril Cough   Benicar [olmesartan Medoxomil] Other (See Comments)   HEADACHE   Cozaar Nausea And Vomiting   Hydralazine Other (See Comments)   Hair loss   Hydrochlorothiazide Other (See Comments)   Hydrochlorothiazide W-triamterene Other (See Comments)    dizzy   Hydrocodone Other (See Comments)   HEADACHE   Hydroxyzine Pamoate Other (See Comments)   Per MAR   Iodine Other (See Comments)   Per MAR   Lisinopril Other (See Comments), Cough   Tired & fatigue   Peach Flavor Itching   Penicillins Itching   Has patient had a PCN reaction causing immediate rash,  facial/tongue/throat swelling, SOB or lightheadedness with hypotension: No Has patient had a PCN reaction causing severe rash involving mucus membranes or skin necrosis: No Has patient had a PCN reaction that required hospitalization No Has patient had a PCN reaction occurring within the last 10 years: Yes If all of the above answers are "NO", then may proceed with Cephalosporin use. tolerates cephalosporins OK   Pravastatin Other (See Comments)   Myalgias-muscle pain   Prednisolone Nausea Only, Other (See Comments)   Upset stomach   Strawberry Flavor Itching   Tramadol Hcl Other (See Comments)   headache   Benadryl [diphenhydramine] Itching, Palpitations        Medication List     STOP taking these medications    citalopram 10 MG tablet Commonly known as: CeleXA       TAKE these medications     acetaminophen 500 MG tablet Commonly known as: TYLENOL Take 1,000 mg by mouth 3 (three) times daily. What changed: Another medication with the same name was removed. Continue taking this medication, and follow the directions you see here.   amLODipine 5 MG tablet Commonly known as: NORVASC Take 1 tablet (5 mg total) by mouth daily.   amoxicillin 500 MG tablet Commonly known as: AMOXIL Take 1 tablet (500 mg total) by mouth 2 (two) times daily for 4 days.   aspirin EC 81 MG tablet Take 1 tablet (81 mg total) by mouth daily. Swallow whole.   B-complex with vitamin C tablet Take 1 tablet by mouth daily.   carvedilol 3.125 MG tablet Commonly known as: COREG Take 1 tablet (3.125 mg total) by mouth 2 (two) times daily with a meal.   cholecalciferol 25 MCG (1000 UNIT) tablet Commonly known as: VITAMIN D Take 1,000 Units by mouth daily.   diclofenac Sodium 1 % Gel Commonly known as: VOLTAREN Apply 1 application topically 2 (two) times daily. Back pain   divalproex 125 MG DR tablet Commonly known as: DEPAKOTE Take 6 tablets (750 mg total) by mouth 2 (two) times daily. What changed:  how much to take Another medication with the same name was removed. Continue taking this medication, and follow the directions you see here.   DULoxetine 20 MG capsule Commonly known as: CYMBALTA Take 20 mg by mouth daily.   feeding supplement (GLUCERNA SHAKE) Liqd Take 237 mLs by mouth 3 (three) times daily between meals.   ferrous sulfate 325 (65 FE) MG tablet Take 1 tablet (325 mg total) by mouth daily.   Humira Pen 40 MG/0.4ML Pnkt Generic drug: Adalimumab Inject 40 mg into the skin every 14 (fourteen) days. On the 4th and 18th of the month   ibuprofen 800 MG tablet Commonly known as: ADVIL Take 1 tablet (800 mg total) by mouth every 8 (eight) hours as needed for moderate pain.   pantoprazole 40 MG tablet Commonly known as: Protonix Take 1 tablet (40 mg total) by mouth daily.    polyethylene glycol 17 g packet Commonly known as: MIRALAX / GLYCOLAX Take 17 g by mouth See admin instructions. 17g orally on Sunday, Monday, Wednesday, Friday, Saturday   PROSTAT PO Take 30 mLs by mouth 2 (two) times daily.   rosuvastatin 10 MG tablet Commonly known as: CRESTOR Take 10 mg by mouth daily.   senna-docusate 8.6-50 MG tablet Commonly known as: Senokot-S Take 2 tablets by mouth 2 (two) times daily.   thiamine 100 MG tablet Take 1 tablet (100 mg total) by mouth daily.  Follow-up Information     Plotnikov, [name], MD. Schedule an appointment as soon as possible for a visit in 1 week(s).   Specialty: Internal Medicine Contact information: [address] [place] [address] [phone]                Allergies  Allergen Reactions   Ativan [Lorazepam] Other (See Comments)    Very confused   Tramadol Hcl Anxiety and Rash    Headache   Amlodipine Besylate Other (See Comments)     dizzy   Atenolol Other (See Comments)    Fatigue   Benazepril Cough   Benicar [Olmesartan Medoxomil] Other (See Comments)    HEADACHE   Cozaar Nausea And Vomiting   Hydralazine Other (See Comments)    Hair loss   Hydrochlorothiazide Other (See Comments)   Hydrochlorothiazide W-Triamterene Other (See Comments)     dizzy   Hydrocodone Other (See Comments)    HEADACHE   Hydroxyzine Pamoate Other (See Comments)    Per MAR   Iodine Other (See Comments)    Per MAR   Lisinopril Other (See Comments) and Cough    Tired & fatigue   Peach Flavor Itching   Penicillins Itching    Has patient had a PCN reaction causing immediate rash, facial/tongue/throat swelling, SOB or lightheadedness with hypotension: No Has patient had a PCN reaction causing severe rash involving mucus membranes or skin necrosis: No Has patient had a PCN reaction that required hospitalization No Has patient had a PCN reaction occurring within the last 10 years: Yes If all of the above  answers are "NO", then may proceed with Cephalosporin use.  tolerates cephalosporins OK    Pravastatin Other (See Comments)    Myalgias-muscle pain   Prednisolone Nausea Only and Other (See Comments)    Upset stomach   Strawberry Flavor Itching   Tramadol Hcl Other (See Comments)    headache   Benadryl [Diphenhydramine] Itching and Palpitations    Consultations: Neurology   Procedures/Studies: CT HEAD WO CONTRAST ( )  Result Date: 06/29/2021 CLINICAL DATA:  Altered mental status, nonverbal EXAM: CT HEAD WITHOUT CONTRAST TECHNIQUE: Contiguous axial images were obtained from the base of the skull through the vertex without intravenous contrast. COMPARISON:  CT head 05/08/2021 FINDINGS: Brain: There is no evidence of acute intracranial hemorrhage, extra-axial fluid collection, or infarct. Foci of hypodensity in the subcortical and periventricular white matter nonspecific but likely reflect sequelae of chronic white matter microangiopathy, unchanged. The ventricles are stable in size. There is no midline shift. No mass lesion is identified. And at the sella is noted. Vascular: There is calcification of the bilateral cavernous ICAs. Skull: Normal. Negative for fracture or focal lesion. Sinuses/Orbits: The paranasal sinuses are clear. A left lens implant is noted. The globes and orbits are otherwise unremarkable. Other: The mastoid air cells are clear. IMPRESSION: 1. No acute intracranial pathology. 2. Stable parenchymal volume loss and chronic white matter microangiopathy. Electronically Signed   By: Lesia Hausen M.D.   On: 06/29/2021 12:36   MR BRAIN W WO CONTRAST  Result Date: 06/30/2021 CLINICAL DATA:  Mental status change, worsening or persistent EXAM: MRI HEAD WITHOUT AND WITH CONTRAST TECHNIQUE: Multiplanar, multiecho pulse sequences of the brain and surrounding structures were obtained without and with intravenous contrast. CONTRAST:  6mL GADAVIST GADOBUTROL 1 MMOL/ML IV SOLN COMPARISON:   01/09/2021 FINDINGS: Motion artifact is present. Brain: There is no acute infarction or intracranial hemorrhage. There is no intracranial mass, mass effect, or edema.  There is no hydrocephalus or extra-axial fluid collection. Prominence of the ventricles and sulci reflecting stable generalized parenchymal volume loss. Patchy and confluent areas of T2 hyperintensity in the supratentorial white matter most compatible with stable chronic microvascular ischemic changes. Small chronic right cerebellar and left corona radiata infarcts. No abnormal enhancement. Vascular: Major vessel flow voids at the skull base are preserved. Skull and upper cervical spine: Normal marrow signal is preserved. Sinuses/Orbits: Patchy mucosal thickening.  Left lens replacement. Other: Sella is expanded and empty.  Mastoid air cells are clear. IMPRESSION: Degraded by motion artifact. No evidence of recent infarction, hemorrhage, or mass. No abnormal enhancement. Chronic findings are unchanged from the prior study. Electronically Signed   By: Guadlupe Spanish M.D.   On: 06/30/2021 20:17   DG Chest Port 1 View  Result Date: 06/29/2021 CLINICAL DATA:  Weakness EXAM: PORTABLE CHEST 1 VIEW COMPARISON:  Chest radiograph 05/08/2021 FINDINGS: The cardiomediastinal silhouette is stable. There is calcified atherosclerotic plaque of the aortic arch. Lung volumes are low, resulting in bronchovascular crowding. There is no focal consolidation or pulmonary edema. There is no pleural effusion or pneumothorax There is no acute osseous abnormality. Cholecystectomy clips are noted. IMPRESSION: Low lung volumes. Otherwise, no radiographic evidence of acute cardiopulmonary process. Electronically Signed   By: Lesia Hausen M.D.   On: 06/29/2021 12:29     Subjective: Patient seen examined bedside, resting comfortably.  Family present.  Eating breakfast.  No complaints this morning.  Discharging back to [facility] today.  Patient denies headache, no  fever, no chest pain, no palpitations, no shortness of breath, no abdominal pain.  No acute events overnight per nursing staff.  Discharge Exam: Vitals:   07/03/21 0424 07/03/21 1011  BP: (!) 179/76 (!) 173/68  Pulse: 70   Resp: 18   Temp: (!) 97.5 F (36.4 C)   SpO2: 100%    Vitals:   07/02/21 2006 07/02/21 2330 07/03/21 0424 07/03/21 1011  BP: (!) 186/73 (!) 188/73 (!) 179/76 (!) 173/68  Pulse: 75 67 70   Resp: 17 20 18    Temp: 97.6 F (36.4 C) 97.8 F (36.6 C) (!) 97.5 F (36.4 C)   TempSrc: Oral Oral Oral   SpO2: 100% 99% 100%   Weight:      Height:        General: Pt is alert, awake, not in acute distress, chronically ill in appearance Cardiovascular: RRR, S1/S2 +, no rubs, no gallops Respiratory: CTA bilaterally, no wheezing, no rhonchi, on room air Abdominal: Soft, NT, ND, bowel sounds + Extremities: no edema, no cyanosis    The results of significant diagnostics from this hospitalization (including imaging, microbiology, ancillary and laboratory) are listed below for reference.     Microbiology: Recent Results (from the past 240 hour(s))  Resp Panel by RT-PCR (Flu A&B, Covid) Nasopharyngeal Swab     Status: None   Collection Time: 06/29/21 11:17 AM   Specimen: Nasopharyngeal Swab; Nasopharyngeal(NP) swabs in vial transport medium  Result Value Ref Range Status   SARS Coronavirus 2 by RT PCR NEGATIVE NEGATIVE Final    Comment: (NOTE) SARS-CoV-2 target nucleic acids are NOT DETECTED.  The SARS-CoV-2 RNA is generally detectable in upper respiratory specimens during the acute phase of infection. The lowest concentration of SARS-CoV-2 viral copies this assay can detect is 138 copies/mL. A negative result does not preclude SARS-Cov-2 infection and should not be used as the sole basis for treatment or other patient management decisions. A negative result may  occur with  improper specimen collection/handling, submission of specimen other than nasopharyngeal  swab, presence of viral mutation(s) within the areas targeted by this assay, and inadequate number of viral copies(<138 copies/mL). A negative result must be combined with clinical observations, patient history, and epidemiological information. The expected result is Negative.  Fact Sheet for Patients:  BloggerCourse.com  Fact Sheet for Healthcare Providers:  SeriousBroker.it  This test is no t yet approved or cleared by the [place] FDA and  has been authorized for detection and/or diagnosis of SARS-CoV-2 by FDA under an Emergency Use Authorization (EUA). This EUA will remain  in effect (meaning this test can be used) for the duration of the COVID-19 declaration under Section 564(b)(1) of the Act, 21 U.S.C.section 360bbb-3(b)(1), unless the authorization is terminated  or revoked sooner.       Influenza A by PCR NEGATIVE NEGATIVE Final   Influenza B by PCR NEGATIVE NEGATIVE Final    Comment: (NOTE) The Xpert Xpress SARS-CoV-2/FLU/RSV plus assay is intended as an aid in the diagnosis of influenza from Nasopharyngeal swab specimens and should not be used as a sole basis for treatment. Nasal washings and aspirates are unacceptable for Xpert Xpress SARS-CoV-2/FLU/RSV testing.  Fact Sheet for Patients: BloggerCourse.com  Fact Sheet for Healthcare Providers: SeriousBroker.it  This test is not yet approved or cleared by the [place] FDA and has been authorized for detection and/or diagnosis of SARS-CoV-2 by FDA under an Emergency Use Authorization (EUA). This EUA will remain in effect (meaning this test can be used) for the duration of the COVID-19 declaration under Section 564(b)(1) of the Act, 21 U.S.C. section 360bbb-3(b)(1), unless the authorization is terminated or revoked.  Performed at [facility], [address], [place], [place] [address]    Urine Culture     Status: Abnormal   Collection Time: 06/29/21 11:49 AM   Specimen: Urine, Clean Catch  Result Value Ref Range Status   Specimen Description   Final    URINE, CLEAN CATCH Performed at [facility], [address], [place], [place] [address]    Special Requests   Final    NONE Performed at [facility], [address] [place], [place] [address]    Culture 20,000 COLONIES/mL ENTEROCOCCUS FAECALIS (A)  Final   Report Status 07/03/2021 FINAL  Final   Organism ID, Bacteria ENTEROCOCCUS FAECALIS (A)  Final      Susceptibility   Enterococcus faecalis - MIC*    AMPICILLIN <=2 SENSITIVE Sensitive     NITROFURANTOIN <=16 SENSITIVE Sensitive     VANCOMYCIN 1 SENSITIVE Sensitive     * 20,000 COLONIES/mL ENTEROCOCCUS FAECALIS  MRSA Next Gen by PCR, Nasal     Status: Abnormal   Collection Time: 06/30/21  9:02 AM   Specimen: Nasal Mucosa; Nasal Swab  Result Value Ref Range Status   MRSA by PCR Next Gen DETECTED (A) NOT DETECTED Final    Comment: RESULT CALLED TO, READ BACK BY AND VERIFIED WITH: HEAVNER,A. RN @1405  ON 08.19.2022 BY COHEN,K (NOTE) The GeneXpert MRSA Assay (FDA approved for NASAL specimens only), is one component of a comprehensive MRSA colonization surveillance program. It is not intended to diagnose MRSA infection nor to guide or monitor treatment for MRSA infections. Test performance is not FDA approved in patients less than 34 years old. Performed at [facility], [address] [place], [place]      Labs: BNP (last 3 results) Recent Labs  05/10/21 0111 05/11/21 0112 06/29/21 1119  BNP 257.9* 583.5* 239.9*   Basic Metabolic Panel: Recent Labs  Lab 06/29/21 1115 06/30/21 0434 06/30/21 2136 07/02/21 0255  NA 139 137  --  140  K 4.5 4.8  --  3.7  CL 107 107  --  111  CO2 25 24  --  22  GLUCOSE 90 93 85 94  BUN 18 20  --  17  CREATININE 0.70 1.06*  --  0.83  CALCIUM  8.7* 8.3*  --  8.1*   Liver Function Tests: Recent Labs  Lab 06/29/21 1115 06/30/21 0434  AST 23 42*  ALT 8 10  ALKPHOS 51 43  BILITOT 0.5 1.1  PROT 6.9 5.8*  ALBUMIN 2.8* 2.5*   No results for input(s): LIPASE, AMYLASE in the last 168 hours. No results for input(s): AMMONIA in the last 168 hours. CBC: Recent Labs  Lab 06/29/21 1115 06/30/21 0434  WBC 8.2 7.1  NEUTROABS 5.7  --   HGB 9.2* 8.1*  HCT 29.3* 25.7*  MCV 106.2* 104.9*  PLT 228 228   Cardiac Enzymes: No results for input(s): CKTOTAL, CKMB, CKMBINDEX, TROPONINI in the last 168 hours. BNP: Invalid input(s): POCBNP CBG: Recent Labs  Lab 07/01/21 2330 07/02/21 0321 07/02/21 0737 07/02/21 1756 07/02/21 2007  GLUCAP 119* 89 82 80 82   D-Dimer No results for input(s): DDIMER in the last 72 hours. Hgb A1c No results for input(s): HGBA1C in the last 72 hours. Lipid Profile No results for input(s): CHOL, HDL, LDLCALC, TRIG, CHOLHDL, LDLDIRECT in the last 72 hours. Thyroid function studies No results for input(s): TSH, T4TOTAL, T3FREE, THYROIDAB in the last 72 hours.  Invalid input(s): FREET3 Anemia work up No results for input(s): VITAMINB12, FOLATE, FERRITIN, TIBC, IRON, RETICCTPCT in the last 72 hours. Urinalysis    Component Value Date/Time   COLORURINE YELLOW 06/29/2021 1149   APPEARANCEUR CLEAR 06/29/2021 1149   LABSPEC 1.010 06/29/2021 1149   LABSPEC 1.020 04/23/2006 1313   PHURINE 8.0 06/29/2021 1149   GLUCOSEU NEGATIVE 06/29/2021 1149   GLUCOSEU NEGATIVE 02/28/2016 1559   HGBUR NEGATIVE 06/29/2021 1149   BILIRUBINUR NEGATIVE 06/29/2021 1149   BILIRUBINUR Negative 04/23/2006 1313   KETONESUR NEGATIVE 06/29/2021 1149   PROTEINUR TRACE (A) 06/29/2021 1149   UROBILINOGEN 0.2 02/28/2016 1559   NITRITE NEGATIVE 06/29/2021 1149   LEUKOCYTESUR TRACE (A) 06/29/2021 1149   LEUKOCYTESUR Trace 04/23/2006 1313   Sepsis Labs Invalid input(s): PROCALCITONIN,  WBC,  LACTICIDVEN Microbiology Recent  Results (from the past 240 hour(s))  Resp Panel by RT-PCR (Flu A&B, Covid) Nasopharyngeal Swab     Status: None   Collection Time: 06/29/21 11:17 AM   Specimen: Nasopharyngeal Swab; Nasopharyngeal(NP) swabs in vial transport medium  Result Value Ref Range Status   SARS Coronavirus 2 by RT PCR NEGATIVE NEGATIVE Final    Comment: (NOTE) SARS-CoV-2 target nucleic acids are NOT DETECTED.  The SARS-CoV-2 RNA is generally detectable in upper respiratory specimens during the acute phase of infection. The lowest concentration of SARS-CoV-2 viral copies this assay can detect is 138 copies/mL. A negative result does not preclude SARS-Cov-2 infection and should not be used as the sole basis for treatment or other patient management decisions. A negative result may occur with  improper specimen collection/handling, submission of specimen other than nasopharyngeal swab, presence of viral mutation(s) within the areas targeted by this assay, and inadequate number of viral copies(<138 copies/mL). A negative result must be combined with clinical observations, patient history, and epidemiological  information. The expected result is Negative.  Fact Sheet for Patients:  BloggerCourse.com  Fact Sheet for Healthcare Providers:  SeriousBroker.it  This test is no t yet approved or cleared by the [place] FDA and  has been authorized for detection and/or diagnosis of SARS-CoV-2 by FDA under an Emergency Use Authorization (EUA). This EUA will remain  in effect (meaning this test can be used) for the duration of the COVID-19 declaration under Section 564(b)(1) of the Act, 21 U.S.C.section 360bbb-3(b)(1), unless the authorization is terminated  or revoked sooner.       Influenza A by PCR NEGATIVE NEGATIVE Final   Influenza B by PCR NEGATIVE NEGATIVE Final    Comment: (NOTE) The Xpert Xpress SARS-CoV-2/FLU/RSV plus assay is intended as an aid in the  diagnosis of influenza from Nasopharyngeal swab specimens and should not be used as a sole basis for treatment. Nasal washings and aspirates are unacceptable for Xpert Xpress SARS-CoV-2/FLU/RSV testing.  Fact Sheet for Patients: BloggerCourse.com  Fact Sheet for Healthcare Providers: SeriousBroker.it  This test is not yet approved or cleared by the [place] FDA and has been authorized for detection and/or diagnosis of SARS-CoV-2 by FDA under an Emergency Use Authorization (EUA). This EUA will remain in effect (meaning this test can be used) for the duration of the COVID-19 declaration under Section 564(b)(1) of the Act, 21 U.S.C. section 360bbb-3(b)(1), unless the authorization is terminated or revoked.  Performed at [facility], [address], [place], [place] [address]   Urine Culture     Status: Abnormal   Collection Time: 06/29/21 11:49 AM   Specimen: Urine, Clean Catch  Result Value Ref Range Status   Specimen Description   Final    URINE, CLEAN CATCH Performed at [facility], [address], [place], [place] [address]    Special Requests   Final    NONE Performed at [facility], [address], [place], [place] [address]    Culture 20,000 COLONIES/mL ENTEROCOCCUS FAECALIS (A)  Final   Report Status 07/03/2021 FINAL  Final   Organism ID, Bacteria ENTEROCOCCUS FAECALIS (A)  Final      Susceptibility   Enterococcus faecalis - MIC*    AMPICILLIN <=2 SENSITIVE Sensitive     NITROFURANTOIN <=16 SENSITIVE Sensitive     VANCOMYCIN 1 SENSITIVE Sensitive     * 20,000 COLONIES/mL ENTEROCOCCUS FAECALIS  MRSA Next Gen by PCR, Nasal     Status: Abnormal   Collection Time: 06/30/21  9:02 AM   Specimen: Nasal Mucosa; Nasal Swab  Result Value Ref Range Status   MRSA by PCR Next Gen DETECTED (A) NOT DETECTED Final    Comment: RESULT CALLED TO, READ BACK BY AND  VERIFIED WITH: HEAVNER,A. RN @1405  ON 08.19.2022 BY COHEN,K (NOTE) The GeneXpert MRSA Assay (FDA approved for NASAL specimens only), is one component of a comprehensive MRSA colonization surveillance program. It is not intended to diagnose MRSA infection nor to guide or monitor treatment for MRSA infections. Test performance is not FDA approved in patients less than 15 years old. Performed at [facility], [address] [place], [place]      Time coordinating discharge: Over 30 minutes  SIGNED:   [id number] Alvira Philips, DO  [facility] 07/03/2021, 11:06 AM

## 2021-07-03 NOTE — Progress Notes (Signed)
Report attempted to [facility] place x2. Spoke with receptionist Jan both times who transferred me to the nurses station and no answer.

## 2021-07-03 NOTE — Progress Notes (Signed)
Report attempted to [facility] place x1. Will try again.

## 2021-07-04 DIAGNOSIS — B952 Enterococcus as the cause of diseases classified elsewhere: Secondary | ICD-10-CM | POA: Diagnosis not present

## 2021-07-04 DIAGNOSIS — R278 Other lack of coordination: Secondary | ICD-10-CM | POA: Diagnosis not present

## 2021-07-04 DIAGNOSIS — E785 Hyperlipidemia, unspecified: Secondary | ICD-10-CM | POA: Diagnosis not present

## 2021-07-04 DIAGNOSIS — I10 Essential (primary) hypertension: Secondary | ICD-10-CM | POA: Diagnosis not present

## 2021-07-04 DIAGNOSIS — R2689 Other abnormalities of gait and mobility: Secondary | ICD-10-CM | POA: Diagnosis not present

## 2021-07-04 DIAGNOSIS — J690 Pneumonitis due to inhalation of food and vomit: Secondary | ICD-10-CM | POA: Diagnosis not present

## 2021-07-04 DIAGNOSIS — F39 Unspecified mood [affective] disorder: Secondary | ICD-10-CM | POA: Diagnosis not present

## 2021-07-04 DIAGNOSIS — M24571 Contracture, right ankle: Secondary | ICD-10-CM | POA: Diagnosis not present

## 2021-07-04 DIAGNOSIS — G9341 Metabolic encephalopathy: Secondary | ICD-10-CM | POA: Diagnosis not present

## 2021-07-04 DIAGNOSIS — G40909 Epilepsy, unspecified, not intractable, without status epilepticus: Secondary | ICD-10-CM | POA: Diagnosis not present

## 2021-07-04 DIAGNOSIS — M25661 Stiffness of right knee, not elsewhere classified: Secondary | ICD-10-CM | POA: Diagnosis not present

## 2021-07-04 DIAGNOSIS — L89153 Pressure ulcer of sacral region, stage 3: Secondary | ICD-10-CM | POA: Diagnosis not present

## 2021-07-04 DIAGNOSIS — M24572 Contracture, left ankle: Secondary | ICD-10-CM | POA: Diagnosis not present

## 2021-07-04 DIAGNOSIS — M25662 Stiffness of left knee, not elsewhere classified: Secondary | ICD-10-CM | POA: Diagnosis not present

## 2021-07-04 DIAGNOSIS — I679 Cerebrovascular disease, unspecified: Secondary | ICD-10-CM | POA: Diagnosis not present

## 2021-07-04 DIAGNOSIS — E236 Other disorders of pituitary gland: Secondary | ICD-10-CM | POA: Diagnosis not present

## 2021-07-04 DIAGNOSIS — M069 Rheumatoid arthritis, unspecified: Secondary | ICD-10-CM | POA: Diagnosis not present

## 2021-07-04 DIAGNOSIS — G928 Other toxic encephalopathy: Secondary | ICD-10-CM | POA: Diagnosis not present

## 2021-07-05 DIAGNOSIS — M25662 Stiffness of left knee, not elsewhere classified: Secondary | ICD-10-CM | POA: Diagnosis not present

## 2021-07-05 DIAGNOSIS — J690 Pneumonitis due to inhalation of food and vomit: Secondary | ICD-10-CM | POA: Diagnosis not present

## 2021-07-05 DIAGNOSIS — M069 Rheumatoid arthritis, unspecified: Secondary | ICD-10-CM | POA: Diagnosis not present

## 2021-07-05 DIAGNOSIS — R2689 Other abnormalities of gait and mobility: Secondary | ICD-10-CM | POA: Diagnosis not present

## 2021-07-05 DIAGNOSIS — R278 Other lack of coordination: Secondary | ICD-10-CM | POA: Diagnosis not present

## 2021-07-05 DIAGNOSIS — M24571 Contracture, right ankle: Secondary | ICD-10-CM | POA: Diagnosis not present

## 2021-07-05 DIAGNOSIS — M25661 Stiffness of right knee, not elsewhere classified: Secondary | ICD-10-CM | POA: Diagnosis not present

## 2021-07-05 DIAGNOSIS — G928 Other toxic encephalopathy: Secondary | ICD-10-CM | POA: Diagnosis not present

## 2021-07-05 DIAGNOSIS — M24572 Contracture, left ankle: Secondary | ICD-10-CM | POA: Diagnosis not present

## 2021-07-05 LAB — GLUCOSE, CAPILLARY
Glucose-Capillary: 60 mg/dL — ABNORMAL LOW (ref 70–99)
Glucose-Capillary: 76 mg/dL (ref 70–99)

## 2021-07-06 DIAGNOSIS — R278 Other lack of coordination: Secondary | ICD-10-CM | POA: Diagnosis not present

## 2021-07-06 DIAGNOSIS — J690 Pneumonitis due to inhalation of food and vomit: Secondary | ICD-10-CM | POA: Diagnosis not present

## 2021-07-06 DIAGNOSIS — M069 Rheumatoid arthritis, unspecified: Secondary | ICD-10-CM | POA: Diagnosis not present

## 2021-07-06 DIAGNOSIS — G928 Other toxic encephalopathy: Secondary | ICD-10-CM | POA: Diagnosis not present

## 2021-07-06 DIAGNOSIS — M24572 Contracture, left ankle: Secondary | ICD-10-CM | POA: Diagnosis not present

## 2021-07-06 DIAGNOSIS — N183 Chronic kidney disease, stage 3 unspecified: Secondary | ICD-10-CM | POA: Diagnosis not present

## 2021-07-06 DIAGNOSIS — M24571 Contracture, right ankle: Secondary | ICD-10-CM | POA: Diagnosis not present

## 2021-07-06 DIAGNOSIS — M25661 Stiffness of right knee, not elsewhere classified: Secondary | ICD-10-CM | POA: Diagnosis not present

## 2021-07-06 DIAGNOSIS — E119 Type 2 diabetes mellitus without complications: Secondary | ICD-10-CM | POA: Diagnosis not present

## 2021-07-06 DIAGNOSIS — R2689 Other abnormalities of gait and mobility: Secondary | ICD-10-CM | POA: Diagnosis not present

## 2021-07-06 DIAGNOSIS — M25662 Stiffness of left knee, not elsewhere classified: Secondary | ICD-10-CM | POA: Diagnosis not present

## 2021-07-07 DIAGNOSIS — M069 Rheumatoid arthritis, unspecified: Secondary | ICD-10-CM | POA: Diagnosis not present

## 2021-07-07 DIAGNOSIS — J690 Pneumonitis due to inhalation of food and vomit: Secondary | ICD-10-CM | POA: Diagnosis not present

## 2021-07-07 DIAGNOSIS — R2689 Other abnormalities of gait and mobility: Secondary | ICD-10-CM | POA: Diagnosis not present

## 2021-07-07 DIAGNOSIS — R278 Other lack of coordination: Secondary | ICD-10-CM | POA: Diagnosis not present

## 2021-07-07 DIAGNOSIS — G928 Other toxic encephalopathy: Secondary | ICD-10-CM | POA: Diagnosis not present

## 2021-07-07 DIAGNOSIS — M25662 Stiffness of left knee, not elsewhere classified: Secondary | ICD-10-CM | POA: Diagnosis not present

## 2021-07-07 DIAGNOSIS — M25661 Stiffness of right knee, not elsewhere classified: Secondary | ICD-10-CM | POA: Diagnosis not present

## 2021-07-07 DIAGNOSIS — M24571 Contracture, right ankle: Secondary | ICD-10-CM | POA: Diagnosis not present

## 2021-07-07 DIAGNOSIS — M24572 Contracture, left ankle: Secondary | ICD-10-CM | POA: Diagnosis not present

## 2021-07-10 ENCOUNTER — Telehealth: Payer: Self-pay | Admitting: Physician Assistant

## 2021-07-10 DIAGNOSIS — M069 Rheumatoid arthritis, unspecified: Secondary | ICD-10-CM | POA: Diagnosis not present

## 2021-07-10 DIAGNOSIS — M25661 Stiffness of right knee, not elsewhere classified: Secondary | ICD-10-CM | POA: Diagnosis not present

## 2021-07-10 DIAGNOSIS — G928 Other toxic encephalopathy: Secondary | ICD-10-CM | POA: Diagnosis not present

## 2021-07-10 DIAGNOSIS — R2689 Other abnormalities of gait and mobility: Secondary | ICD-10-CM | POA: Diagnosis not present

## 2021-07-10 DIAGNOSIS — M25662 Stiffness of left knee, not elsewhere classified: Secondary | ICD-10-CM | POA: Diagnosis not present

## 2021-07-10 DIAGNOSIS — J690 Pneumonitis due to inhalation of food and vomit: Secondary | ICD-10-CM | POA: Diagnosis not present

## 2021-07-10 DIAGNOSIS — R278 Other lack of coordination: Secondary | ICD-10-CM | POA: Diagnosis not present

## 2021-07-10 DIAGNOSIS — M24571 Contracture, right ankle: Secondary | ICD-10-CM | POA: Diagnosis not present

## 2021-07-10 DIAGNOSIS — M24572 Contracture, left ankle: Secondary | ICD-10-CM | POA: Diagnosis not present

## 2021-07-10 NOTE — Telephone Encounter (Signed)
Scheduled per sch msg. Called, not able to leave msg. Mailed printout  °

## 2021-07-11 DIAGNOSIS — M25662 Stiffness of left knee, not elsewhere classified: Secondary | ICD-10-CM | POA: Diagnosis not present

## 2021-07-11 DIAGNOSIS — R2689 Other abnormalities of gait and mobility: Secondary | ICD-10-CM | POA: Diagnosis not present

## 2021-07-11 DIAGNOSIS — I152 Hypertension secondary to endocrine disorders: Secondary | ICD-10-CM | POA: Diagnosis not present

## 2021-07-11 DIAGNOSIS — R278 Other lack of coordination: Secondary | ICD-10-CM | POA: Diagnosis not present

## 2021-07-11 DIAGNOSIS — N390 Urinary tract infection, site not specified: Secondary | ICD-10-CM | POA: Diagnosis not present

## 2021-07-11 DIAGNOSIS — M069 Rheumatoid arthritis, unspecified: Secondary | ICD-10-CM | POA: Diagnosis not present

## 2021-07-11 DIAGNOSIS — G40909 Epilepsy, unspecified, not intractable, without status epilepticus: Secondary | ICD-10-CM | POA: Diagnosis not present

## 2021-07-11 DIAGNOSIS — I5032 Chronic diastolic (congestive) heart failure: Secondary | ICD-10-CM | POA: Diagnosis not present

## 2021-07-11 DIAGNOSIS — Z515 Encounter for palliative care: Secondary | ICD-10-CM | POA: Diagnosis not present

## 2021-07-11 DIAGNOSIS — L89153 Pressure ulcer of sacral region, stage 3: Secondary | ICD-10-CM | POA: Diagnosis not present

## 2021-07-11 DIAGNOSIS — J690 Pneumonitis due to inhalation of food and vomit: Secondary | ICD-10-CM | POA: Diagnosis not present

## 2021-07-11 DIAGNOSIS — M25661 Stiffness of right knee, not elsewhere classified: Secondary | ICD-10-CM | POA: Diagnosis not present

## 2021-07-11 DIAGNOSIS — M24572 Contracture, left ankle: Secondary | ICD-10-CM | POA: Diagnosis not present

## 2021-07-11 DIAGNOSIS — G928 Other toxic encephalopathy: Secondary | ICD-10-CM | POA: Diagnosis not present

## 2021-07-11 DIAGNOSIS — M24571 Contracture, right ankle: Secondary | ICD-10-CM | POA: Diagnosis not present

## 2021-07-11 LAB — GLUCOSE, CAPILLARY: Glucose-Capillary: 44 mg/dL — CL (ref 70–99)

## 2021-07-12 DIAGNOSIS — M24572 Contracture, left ankle: Secondary | ICD-10-CM | POA: Diagnosis not present

## 2021-07-12 DIAGNOSIS — M069 Rheumatoid arthritis, unspecified: Secondary | ICD-10-CM | POA: Diagnosis not present

## 2021-07-12 DIAGNOSIS — M24571 Contracture, right ankle: Secondary | ICD-10-CM | POA: Diagnosis not present

## 2021-07-12 DIAGNOSIS — M25662 Stiffness of left knee, not elsewhere classified: Secondary | ICD-10-CM | POA: Diagnosis not present

## 2021-07-12 DIAGNOSIS — D649 Anemia, unspecified: Secondary | ICD-10-CM | POA: Diagnosis not present

## 2021-07-12 DIAGNOSIS — F411 Generalized anxiety disorder: Secondary | ICD-10-CM | POA: Diagnosis not present

## 2021-07-12 DIAGNOSIS — R278 Other lack of coordination: Secondary | ICD-10-CM | POA: Diagnosis not present

## 2021-07-12 DIAGNOSIS — F331 Major depressive disorder, recurrent, moderate: Secondary | ICD-10-CM | POA: Diagnosis not present

## 2021-07-12 DIAGNOSIS — J690 Pneumonitis due to inhalation of food and vomit: Secondary | ICD-10-CM | POA: Diagnosis not present

## 2021-07-12 DIAGNOSIS — M25661 Stiffness of right knee, not elsewhere classified: Secondary | ICD-10-CM | POA: Diagnosis not present

## 2021-07-12 DIAGNOSIS — R4189 Other symptoms and signs involving cognitive functions and awareness: Secondary | ICD-10-CM | POA: Diagnosis not present

## 2021-07-12 DIAGNOSIS — R2689 Other abnormalities of gait and mobility: Secondary | ICD-10-CM | POA: Diagnosis not present

## 2021-07-12 DIAGNOSIS — G928 Other toxic encephalopathy: Secondary | ICD-10-CM | POA: Diagnosis not present

## 2021-07-13 ENCOUNTER — Inpatient Hospital Stay (HOSPITAL_COMMUNITY)
Admission: EM | Admit: 2021-07-13 | Discharge: 2021-07-18 | DRG: 871 | Disposition: A | Discharge status: Skilled Nursing Facility | Payer: Medicare HMO | Source: Skilled Nursing Facility | Attending: Student | Admitting: Student

## 2021-07-13 ENCOUNTER — Encounter (HOSPITAL_COMMUNITY): Payer: Self-pay | Admitting: *Deleted

## 2021-07-13 ENCOUNTER — Emergency Department (HOSPITAL_COMMUNITY): Payer: Medicare HMO

## 2021-07-13 DIAGNOSIS — Z79899 Other long term (current) drug therapy: Secondary | ICD-10-CM

## 2021-07-13 DIAGNOSIS — R4182 Altered mental status, unspecified: Secondary | ICD-10-CM | POA: Diagnosis not present

## 2021-07-13 DIAGNOSIS — Z7401 Bed confinement status: Secondary | ICD-10-CM | POA: Diagnosis not present

## 2021-07-13 DIAGNOSIS — R278 Other lack of coordination: Secondary | ICD-10-CM | POA: Diagnosis not present

## 2021-07-13 DIAGNOSIS — M130 Polyarthritis, unspecified: Secondary | ICD-10-CM | POA: Diagnosis present

## 2021-07-13 DIAGNOSIS — R6520 Severe sepsis without septic shock: Secondary | ICD-10-CM | POA: Diagnosis present

## 2021-07-13 DIAGNOSIS — G40909 Epilepsy, unspecified, not intractable, without status epilepticus: Secondary | ICD-10-CM | POA: Diagnosis present

## 2021-07-13 DIAGNOSIS — Z87898 Personal history of other specified conditions: Secondary | ICD-10-CM

## 2021-07-13 DIAGNOSIS — E162 Hypoglycemia, unspecified: Secondary | ICD-10-CM | POA: Diagnosis present

## 2021-07-13 DIAGNOSIS — R1310 Dysphagia, unspecified: Secondary | ICD-10-CM | POA: Diagnosis present

## 2021-07-13 DIAGNOSIS — G928 Other toxic encephalopathy: Secondary | ICD-10-CM | POA: Diagnosis not present

## 2021-07-13 DIAGNOSIS — G9341 Metabolic encephalopathy: Secondary | ICD-10-CM | POA: Diagnosis present

## 2021-07-13 DIAGNOSIS — M069 Rheumatoid arthritis, unspecified: Secondary | ICD-10-CM | POA: Diagnosis not present

## 2021-07-13 DIAGNOSIS — I4510 Unspecified right bundle-branch block: Secondary | ICD-10-CM | POA: Diagnosis present

## 2021-07-13 DIAGNOSIS — Z955 Presence of coronary angioplasty implant and graft: Secondary | ICD-10-CM

## 2021-07-13 DIAGNOSIS — R54 Age-related physical debility: Secondary | ICD-10-CM | POA: Diagnosis present

## 2021-07-13 DIAGNOSIS — F419 Anxiety disorder, unspecified: Secondary | ICD-10-CM | POA: Diagnosis present

## 2021-07-13 DIAGNOSIS — R5381 Other malaise: Secondary | ICD-10-CM | POA: Diagnosis not present

## 2021-07-13 DIAGNOSIS — G9340 Encephalopathy, unspecified: Secondary | ICD-10-CM | POA: Diagnosis not present

## 2021-07-13 DIAGNOSIS — Z7982 Long term (current) use of aspirin: Secondary | ICD-10-CM

## 2021-07-13 DIAGNOSIS — J189 Pneumonia, unspecified organism: Secondary | ICD-10-CM | POA: Diagnosis not present

## 2021-07-13 DIAGNOSIS — E860 Dehydration: Secondary | ICD-10-CM

## 2021-07-13 DIAGNOSIS — R000 Tachycardia, unspecified: Secondary | ICD-10-CM | POA: Diagnosis not present

## 2021-07-13 DIAGNOSIS — E785 Hyperlipidemia, unspecified: Secondary | ICD-10-CM | POA: Diagnosis present

## 2021-07-13 DIAGNOSIS — Z833 Family history of diabetes mellitus: Secondary | ICD-10-CM | POA: Diagnosis not present

## 2021-07-13 DIAGNOSIS — Z6822 Body mass index (BMI) 22.0-22.9, adult: Secondary | ICD-10-CM

## 2021-07-13 DIAGNOSIS — E538 Deficiency of other specified B group vitamins: Secondary | ICD-10-CM | POA: Diagnosis present

## 2021-07-13 DIAGNOSIS — I959 Hypotension, unspecified: Secondary | ICD-10-CM | POA: Diagnosis not present

## 2021-07-13 DIAGNOSIS — D539 Nutritional anemia, unspecified: Secondary | ICD-10-CM | POA: Diagnosis present

## 2021-07-13 DIAGNOSIS — M109 Gout, unspecified: Secondary | ICD-10-CM | POA: Diagnosis not present

## 2021-07-13 DIAGNOSIS — E871 Hypo-osmolality and hyponatremia: Secondary | ICD-10-CM | POA: Diagnosis present

## 2021-07-13 DIAGNOSIS — Z7189 Other specified counseling: Secondary | ICD-10-CM | POA: Diagnosis not present

## 2021-07-13 DIAGNOSIS — N179 Acute kidney failure, unspecified: Secondary | ICD-10-CM | POA: Diagnosis present

## 2021-07-13 DIAGNOSIS — R2689 Other abnormalities of gait and mobility: Secondary | ICD-10-CM | POA: Diagnosis not present

## 2021-07-13 DIAGNOSIS — R4189 Other symptoms and signs involving cognitive functions and awareness: Secondary | ICD-10-CM | POA: Diagnosis present

## 2021-07-13 DIAGNOSIS — I712 Thoracic aortic aneurysm, without rupture: Secondary | ICD-10-CM | POA: Diagnosis present

## 2021-07-13 DIAGNOSIS — Z20822 Contact with and (suspected) exposure to covid-19: Secondary | ICD-10-CM | POA: Diagnosis present

## 2021-07-13 DIAGNOSIS — M25661 Stiffness of right knee, not elsewhere classified: Secondary | ICD-10-CM | POA: Diagnosis not present

## 2021-07-13 DIAGNOSIS — I10 Essential (primary) hypertension: Secondary | ICD-10-CM | POA: Diagnosis present

## 2021-07-13 DIAGNOSIS — J690 Pneumonitis due to inhalation of food and vomit: Secondary | ICD-10-CM | POA: Diagnosis present

## 2021-07-13 DIAGNOSIS — M25662 Stiffness of left knee, not elsewhere classified: Secondary | ICD-10-CM | POA: Diagnosis not present

## 2021-07-13 DIAGNOSIS — I152 Hypertension secondary to endocrine disorders: Secondary | ICD-10-CM | POA: Diagnosis not present

## 2021-07-13 DIAGNOSIS — Z8249 Family history of ischemic heart disease and other diseases of the circulatory system: Secondary | ICD-10-CM

## 2021-07-13 DIAGNOSIS — R5383 Other fatigue: Secondary | ICD-10-CM | POA: Diagnosis not present

## 2021-07-13 DIAGNOSIS — I2510 Atherosclerotic heart disease of native coronary artery without angina pectoris: Secondary | ICD-10-CM | POA: Diagnosis present

## 2021-07-13 DIAGNOSIS — I248 Other forms of acute ischemic heart disease: Secondary | ICD-10-CM | POA: Diagnosis present

## 2021-07-13 DIAGNOSIS — J9601 Acute respiratory failure with hypoxia: Secondary | ICD-10-CM | POA: Diagnosis present

## 2021-07-13 DIAGNOSIS — K219 Gastro-esophageal reflux disease without esophagitis: Secondary | ICD-10-CM | POA: Diagnosis present

## 2021-07-13 DIAGNOSIS — Z66 Do not resuscitate: Secondary | ICD-10-CM | POA: Diagnosis not present

## 2021-07-13 DIAGNOSIS — D72829 Elevated white blood cell count, unspecified: Secondary | ICD-10-CM | POA: Diagnosis not present

## 2021-07-13 DIAGNOSIS — A419 Sepsis, unspecified organism: Principal | ICD-10-CM | POA: Diagnosis present

## 2021-07-13 DIAGNOSIS — R29818 Other symptoms and signs involving the nervous system: Secondary | ICD-10-CM | POA: Diagnosis not present

## 2021-07-13 DIAGNOSIS — R404 Transient alteration of awareness: Secondary | ICD-10-CM | POA: Diagnosis not present

## 2021-07-13 DIAGNOSIS — M24571 Contracture, right ankle: Secondary | ICD-10-CM | POA: Diagnosis not present

## 2021-07-13 DIAGNOSIS — M810 Age-related osteoporosis without current pathological fracture: Secondary | ICD-10-CM | POA: Diagnosis present

## 2021-07-13 DIAGNOSIS — N289 Disorder of kidney and ureter, unspecified: Secondary | ICD-10-CM | POA: Diagnosis present

## 2021-07-13 DIAGNOSIS — M24572 Contracture, left ankle: Secondary | ICD-10-CM | POA: Diagnosis not present

## 2021-07-13 DIAGNOSIS — R0902 Hypoxemia: Secondary | ICD-10-CM | POA: Diagnosis not present

## 2021-07-13 DIAGNOSIS — Z515 Encounter for palliative care: Secondary | ICD-10-CM | POA: Diagnosis not present

## 2021-07-13 LAB — ETHANOL: Alcohol, Ethyl (B): <10 mg/dL (ref <10)

## 2021-07-13 LAB — COMPREHENSIVE METABOLIC PANEL
ALT: 11 U/L (ref 0–44)
AST: 36 U/L (ref 15–41)
Albumin: 2.6 g/dL — ABNORMAL LOW (ref 3.5–5.0)
Alkaline Phosphatase: 57 U/L (ref 38–126)
Anion gap: 10 (ref 5–15)
BUN: 31 mg/dL — ABNORMAL HIGH (ref 8–23)
CO2: 24 mmol/L (ref 22–32)
Calcium: 8.9 mg/dL (ref 8.9–10.3)
Chloride: 111 mmol/L (ref 98–111)
Creatinine, Ser: 1.28 mg/dL — ABNORMAL HIGH (ref 0.44–1.00)
GFR, Estimated: 41 mL/min — ABNORMAL LOW (ref >60)
Glucose, Bld: 71 mg/dL (ref 70–99)
Potassium: 4.8 mmol/L (ref 3.5–5.1)
Sodium: 145 mmol/L (ref 135–145)
Total Bilirubin: 0.8 mg/dL (ref 0.3–1.2)
Total Protein: 7.1 g/dL (ref 6.5–8.1)

## 2021-07-13 LAB — URINALYSIS, ROUTINE W REFLEX MICROSCOPIC
Bilirubin Urine: NEGATIVE
Glucose, UA: NEGATIVE mg/dL
Hgb urine dipstick: NEGATIVE
Ketones, ur: NEGATIVE mg/dL
Nitrite: NEGATIVE
Protein, ur: NEGATIVE mg/dL
Specific Gravity, Urine: 1.02 (ref 1.005–1.030)
pH: 6 (ref 5.0–8.0)

## 2021-07-13 LAB — DIFFERENTIAL
Abs Immature Granulocytes: 1.4 10*3/uL — ABNORMAL HIGH (ref 0.00–0.07)
Band Neutrophils: 3 %
Basophils Absolute: 0 10*3/uL (ref 0.0–0.1)
Basophils Relative: 0 %
Eosinophils Absolute: 0.3 10*3/uL (ref 0.0–0.5)
Eosinophils Relative: 2 %
Lymphocytes Relative: 22 %
Lymphs Abs: 3.5 10*3/uL (ref 0.7–4.0)
Metamyelocytes Relative: 7 %
Monocytes Absolute: 0.6 10*3/uL (ref 0.1–1.0)
Monocytes Relative: 4 %
Myelocytes: 2 %
Neutro Abs: 9.9 10*3/uL — ABNORMAL HIGH (ref 1.7–7.7)
Neutrophils Relative %: 60 %

## 2021-07-13 LAB — PROTIME-INR
INR: 1.2 (ref 0.8–1.2)
Prothrombin Time: 15.1 seconds (ref 11.4–15.2)

## 2021-07-13 LAB — CBC
HCT: 32.4 % — ABNORMAL LOW (ref 36.0–46.0)
Hemoglobin: 10 g/dL — ABNORMAL LOW (ref 12.0–15.0)
MCH: 32.3 pg (ref 26.0–34.0)
MCHC: 30.9 g/dL (ref 30.0–36.0)
MCV: 104.5 fL — ABNORMAL HIGH (ref 80.0–100.0)
Platelets: 172 10*3/uL (ref 150–400)
RBC: 3.1 MIL/uL — ABNORMAL LOW (ref 3.87–5.11)
RDW: 14.5 % (ref 11.5–15.5)
WBC: 15.7 10*3/uL — ABNORMAL HIGH (ref 4.0–10.5)
nRBC: 0 % (ref 0.0–0.2)

## 2021-07-13 LAB — BLOOD GAS, VENOUS
Acid-base deficit: 3.6 mmol/L — ABNORMAL HIGH (ref 0.0–2.0)
Bicarbonate: 21.3 mmol/L (ref 20.0–28.0)
O2 Saturation: 99.1 %
Patient temperature: 98.6
pCO2, Ven: 40.8 mmHg — ABNORMAL LOW (ref 44.0–60.0)
pH, Ven: 7.338 (ref 7.250–7.430)
pO2, Ven: 161 mmHg — ABNORMAL HIGH (ref 32.0–45.0)

## 2021-07-13 LAB — RAPID URINE DRUG SCREEN, HOSP PERFORMED
Amphetamines: NOT DETECTED
Barbiturates: NOT DETECTED
Benzodiazepines: NOT DETECTED
Cocaine: NOT DETECTED
Opiates: NOT DETECTED
Tetrahydrocannabinol: NOT DETECTED

## 2021-07-13 LAB — URINALYSIS, MICROSCOPIC (REFLEX)

## 2021-07-13 LAB — RESP PANEL BY RT-PCR (FLU A&B, COVID) ARPGX2
Influenza A by PCR: NEGATIVE
Influenza B by PCR: NEGATIVE
SARS Coronavirus 2 by RT PCR: NEGATIVE

## 2021-07-13 LAB — APTT: aPTT: 55 seconds — ABNORMAL HIGH (ref 24–36)

## 2021-07-13 LAB — AMMONIA: Ammonia: 24 umol/L (ref 9–35)

## 2021-07-13 LAB — VALPROIC ACID LEVEL: Valproic Acid Lvl: 38 ug/mL — ABNORMAL LOW (ref 50.0–100.0)

## 2021-07-13 MED ORDER — SODIUM CHLORIDE 0.9 % IV BOLUS
500.0000 mL | Freq: Once | INTRAVENOUS | Status: AC
  Administered 2021-07-13: 500 mL via INTRAVENOUS

## 2021-07-13 MED ORDER — SODIUM CHLORIDE 0.9 % IV SOLN
500.0000 mg | Freq: Once | INTRAVENOUS | Status: AC
Start: 2021-07-13 — End: 2021-07-14
  Administered 2021-07-13: 500 mg via INTRAVENOUS
  Filled 2021-07-13: qty 500

## 2021-07-13 MED ORDER — SODIUM CHLORIDE 0.9 % IV SOLN
100.0000 mL/h | INTRAVENOUS | Status: DC
  Administered 2021-07-13: 100 mL/h via INTRAVENOUS

## 2021-07-13 MED ORDER — SODIUM CHLORIDE 0.9 % IV SOLN
1.0000 g | Freq: Once | INTRAVENOUS | Status: AC
  Administered 2021-07-13: 1 g via INTRAVENOUS
  Filled 2021-07-13: qty 10

## 2021-07-13 NOTE — ED Provider Notes (Signed)
[facility]-EMERGENCY DEPT Provider Note   CSN: [id number] Arrival date & time: 07/13/21  1647     History Chief Complaint  Patient presents with  . Altered Mental Status    [name] is a 87 y.o. female.   Altered Mental Status  Patient presents to the ED for evaluation of altered mental status.  According to the EMS report from the nursing facility they have noticed a decline in her mental status for the last couple of days.  Patient is at a nursing facility and is chronically bedridden but usually is able to speak and communicate.  Today she has been mumbling and not making a lot of sense.  EMS also noted on arrival that her oxygen saturation was decreased in the 60s.  She was placed on 5 L of nasal cannula and oxygen saturation improved to 94%.  Patient was admitted to the hospital last month on August 18 and was discharged on August 22.  She was felt to have possible seizures and was also diagnosed with a urinary tract infection.  In the ER the patient is mumbling but not really able to follow my commands or answer any questions.  Past Medical History:  Diagnosis Date  . Anemia    iron deficiency  . Aneurysm, thoracic aortic (HCC)   . Anxiety   . B12 deficiency   . CAD (coronary artery disease)    s/p stenting of LAD 1999- cath 5-08 EF normal LAD 30-40% restenosis. D1 50% D2 80% LCX & RCA minimal plaque  . Chronic back pain   . Constipation   . Depression   . GERD (gastroesophageal reflux disease)   . Gout   . HTN (hypertension)   . Hyperlipemia   . Obesity   . Osteoporosis   . Pancreatitis   . Polyarthritis    DJD/ possible PMR  . Renal insufficiency    Cr 1.2-1.3  . Seizures (HCC)   . Tinnitus   . Urinary frequency   . Vertigo   . Vitamin D deficiency     Patient Active Problem List   Diagnosis Date Noted  . Pressure injury of skin 06/30/2021  . Seizure (HCC) 06/29/2021  . Anemia 03/31/2021  . Palpitations 01/08/2021  . AMS  (altered mental status) 01/08/2021  . History of seizure 01/08/2021  . S/P primary angioplasty with coronary stent 01/08/2021  . Chronic diastolic CHF (congestive heart failure) (HCC) 01/08/2021  . CKD (chronic kidney disease) stage 3, GFR 30-59 ml/min (HCC) 08/29/2020  . Depression 08/29/2020  . COVID-19 virus infection 02/05/2020  . Unstable angina (HCC) 10/16/2019  . Non-ST elevation (NSTEMI) myocardial infarction (HCC) 10/16/2019  . Nausea 06/23/2018  . CHF (congestive heart failure) (HCC) 04/03/2018  . AKI (acute kidney injury) (HCC) 11/20/2017  . Dehydration   . Generalized weakness 11/18/2017  . Second degree burn of foot 11/18/2017  . Seizures (HCC) 07/17/2016  . Hypertensive urgency 07/17/2016  . Acute diastolic CHF (congestive heart failure) (HCC) 07/17/2016  . UTI (urinary tract infection) 02/28/2016  . Urinary incontinence 10/28/2015  . Alopecia 08/12/2015  . TIA (transient ischemic attack) 07/01/2015  . Acute encephalopathy 06/24/2015  . Protein-calorie malnutrition, severe (HCC) 04/19/2015  . Fever   . Abdominal abscess   . Pressure ulcer 04/07/2015  . Acute kidney injury (HCC)   . Sepsis (HCC)   . Stroke (HCC)   . Facial droop   . Confusion 03/26/2015  . Malnutrition of moderate degree (HCC) 03/20/2015  . Small  bowel obstruction s/p exlap/LOA/decompression 03/25/2015 03/18/2015  . Essential hypertension 03/18/2015  . Noncompliance with medication regimen 12/07/2014  . Fall against object 06/24/2014  . Contusion of left hand 06/24/2014  . Contusion of left hip 06/24/2014  . Loss of weight 04/12/2014  . Malignant hypertension with renal failure and congestive heart failure (HCC) 09/23/2013  . Knee pain, bilateral 07/27/2013  . Chronic fatigue disorder 01/19/2013  . Vaginitis due to Candida 09/02/2012  . Sinusitis 09/02/2012  . Anemia in other chronic diseases classified elsewhere 09/02/2012  . Polymyalgia rheumatica (HCC) 09/10/2011  . Vertigo 08/21/2011  .  Fatigue 05/11/2011  . ANEMIA OF CHRONIC DISEASE 09/06/2010  . KNEE PAIN, CHRONIC 05/26/2010  . DIZZINESS 05/11/2010  . SHOULDER PAIN 04/25/2010  . FREQUENCY, URINARY 03/16/2010  . CONSTIPATION, CHRONIC 01/17/2010  . FOOT PAIN 01/17/2009  . Rash and other nonspecific skin eruption 01/17/2009  . TINNITUS NOS 10/18/2008  . B12 deficiency 08/10/2008  . LOW BACK PAIN 06/23/2008  . CHEST WALL PAIN 06/23/2008  . Dysuria 03/18/2008  . Abdominal pain 03/18/2008  . Hyperlipidemia 12/24/2007  . DYSPNEA 12/24/2007  . Anxiety disorder 12/07/2007  . GERD 12/07/2007  . CHEST PAIN 12/07/2007  . OTHER SPEC FORMS CHRONIC ISCHEMIC HEART DISEASE 12/01/2007  . Type 2 diabetes mellitus with diabetic nephropathy, without long-term current use of insulin (HCC) 11/28/2007  . Pain in joint 11/28/2007  . Gout 09/17/2007  . Adjustment disorder with mixed anxiety and depressed mood 09/17/2007  . Coronary atherosclerosis 09/17/2007  . Disorder resulting from impaired renal function 09/17/2007  . OSTEOPOROSIS 09/17/2007  . DVT, HX OF 09/17/2007  . PANCREATITIS, HX OF 09/17/2007    Past Surgical History:  Procedure Laterality Date  . ABDOMINAL HYSTERECTOMY    . CARDIAC CATHETERIZATION  2008   L main 20%, LAD stent patent, D1 50%, D2 80% (small), RCA 20%, EF 55-60%  . CHOLECYSTECTOMY    . CORONARY ANGIOPLASTY WITH STENT PLACEMENT  1999   LAD stent  . CORONARY STENT INTERVENTION N/A 10/16/2019   Procedure: CORONARY STENT INTERVENTION;  Surgeon: Marykay Lex, MD;  Location[facility] INVASIVE CV LAB;  Service: Cardiovascular;  Laterality: N/A;  . HEMORRHOID SURGERY    . LAPAROSCOPIC LYSIS OF ADHESIONS N/A 03/24/2015   Procedure: LAPAROSCOPIC LYSIS OF ADHESIONS;  Surgeon: Luretha Murphy, MD;  Location: [facility] ORS;  Service: General;  Laterality: N/A;  . LAPAROSCOPY N/A 03/24/2015   Procedure: LAPAROSCOPY DIAGNOSTIC;  Surgeon: Luretha Murphy, MD;  Location: [facility] ORS;  Service: General;  Laterality: N/A;  . LAPAROTOMY  N/A 03/24/2015   Procedure: LAPAROTOMY with decompression of bowel;  Surgeon: Luretha Murphy, MD;  Location: [facility] ORS;  Service: General;  Laterality: N/A;  . LEFT HEART CATH AND CORONARY ANGIOGRAPHY N/A 10/16/2019   Procedure: LEFT HEART CATH AND CORONARY ANGIOGRAPHY;  Surgeon: Marykay Lex, MD;  Location: [facility] INVASIVE CV LAB;  Service: Cardiovascular;  Laterality: N/A;  . TUBAL LIGATION       OB History   No obstetric history on file.     Family History  Adopted: Yes  Problem Relation Age of Onset  . Diabetes Mother   . Hypertension Father   . Cancer Sister     Social History   Tobacco Use  . Smoking status: Never  . Smokeless tobacco: Never  Substance Use Topics  . Alcohol use: No  . Drug use: No    Home Medications Prior to Admission medications   Medication Sig Start Date End Date Taking? Authorizing Provider  acetaminophen (TYLENOL) 500 MG tablet Take 1,000 mg by mouth 3 (three) times daily.    [provider]  amLODipine (NORVASC) 5 MG tablet Take 1 tablet (5 mg total) by mouth daily. 07/03/21   [place], Alvira Philips, DO  aspirin EC 81 MG tablet Take 1 tablet (81 mg total) by mouth daily. Swallow whole. 10/27/20   Rollene Rotunda, MD  B Complex-C (B-COMPLEX WITH VITAMIN C) tablet Take 1 tablet by mouth daily.    [provider]  carvedilol (COREG) 3.125 MG tablet Take 1 tablet (3.125 mg total) by mouth 2 (two) times daily with a meal. 05/11/21   Elgergawy, Leana Roe, MD  cholecalciferol (VITAMIN D) 25 MCG (1000 UNIT) tablet Take 1,000 Units by mouth daily.    [provider]  diclofenac Sodium (VOLTAREN) 1 % GEL Apply 1 application topically 2 (two) times daily. Back pain    [provider]  divalproex (DEPAKOTE) 125 MG DR tablet Take 6 tablets (750 mg total) by mouth 2 (two) times daily. 07/03/21   [place], Alvira Philips, DO  DULoxetine (CYMBALTA) 20 MG capsule Take 20 mg by mouth daily. 06/15/21   [provider]  feeding supplement,  GLUCERNA SHAKE, (GLUCERNA SHAKE) LIQD Take 237 mLs by mouth 3 (three) times daily between meals. Patient not taking: Reported on 06/29/2021 02/09/20   Leroy Sea, MD  ferrous sulfate 325 (65 FE) MG tablet Take 1 tablet (325 mg total) by mouth daily. 03/21/21   Charlynne Pander, MD  HUMIRA PEN 40 MG/0.4ML PNKT Inject 40 mg into the skin every 14 (fourteen) days. On the 4th and 18th of the month 06/22/21   [provider]  ibuprofen (ADVIL) 800 MG tablet Take 1 tablet (800 mg total) by mouth every 8 (eight) hours as needed for moderate pain. 06/29/21   Bethann Berkshire, MD  pantoprazole (PROTONIX) 40 MG tablet Take 1 tablet (40 mg total) by mouth daily. 02/09/20   Leroy Sea, MD  Pollen Extracts (PROSTAT PO) Take 30 mLs by mouth 2 (two) times daily.    [provider]  polyethylene glycol (MIRALAX / GLYCOLAX) 17 g packet Take 17 g by mouth See admin instructions. 17g orally on Sunday, Monday, Wednesday, Friday, Saturday    [provider]  rosuvastatin (CRESTOR) 10 MG tablet Take 10 mg by mouth daily.    [provider]  senna-docusate (SENOKOT-S) 8.6-50 MG tablet Take 2 tablets by mouth 2 (two) times daily.    [provider]  thiamine 100 MG tablet Take 1 tablet (100 mg total) by mouth daily. 01/14/21   Ghimire, Werner Lean, MD    Allergies    Ativan [lorazepam], Tramadol hcl, Amlodipine besylate, Atenolol, Benazepril, Benicar [olmesartan medoxomil], Cozaar, Hydralazine, Hydrochlorothiazide, Hydrochlorothiazide w-triamterene, Hydrocodone, Hydroxyzine pamoate, Iodine, Lisinopril, Peach flavor, Penicillins, Pravastatin, Prednisolone, Strawberry flavor, Tramadol hcl, and Benadryl [diphenhydramine]  Review of Systems   Review of Systems  Unable to perform ROS: Mental status change   Physical Exam Updated Vital Signs BP (!) 152/67   Pulse 83   Temp 98.9 F (37.2 C) (Axillary)   Resp 18   Ht 1.651 m (5\' 5" )   Wt 62.2 kg Comment: per record  SpO2  96%   BMI 22.82 kg/m   Physical Exam Vitals and nursing note reviewed.  Constitutional:      Appearance: She is well-developed. She is not diaphoretic.     Comments: Elderly, frail  HENT:     Head: Normocephalic and atraumatic.  Right Ear: External ear normal.     Left Ear: External ear normal.     Nose: No rhinorrhea.     Mouth/Throat:     Pharynx: No oropharyngeal exudate or posterior oropharyngeal erythema.  Eyes:     General: No scleral icterus.       Right eye: No discharge.        Left eye: No discharge.     Conjunctiva/sclera: Conjunctivae normal.  Neck:     Trachea: No tracheal deviation.  Cardiovascular:     Rate and Rhythm: Normal rate and regular rhythm.  Pulmonary:     Effort: Pulmonary effort is normal. No respiratory distress.     Breath sounds: Normal breath sounds. No stridor. No wheezing or rales.  Abdominal:     General: Bowel sounds are normal. There is no distension.     Palpations: Abdomen is soft.     Tenderness: There is no abdominal tenderness. There is no guarding or rebound.  Musculoskeletal:        General: No tenderness or deformity.     Cervical back: Neck supple.  Skin:    General: Skin is warm and dry.     Findings: No rash.  Neurological:     General: No focal deficit present.     Cranial Nerves: No cranial nerve deficit (no facial droop, extraocular movements intact, no slurred speech).     Sensory: No sensory deficit.     Motor: No abnormal muscle tone or seizure activity.     Coordination: Coordination normal.  Psychiatric:        Mood and Affect: Mood normal.    ED Results / Procedures / Treatments   Labs (all labs ordered are listed, but only abnormal results are displayed) Labs Reviewed  APTT - Abnormal; Notable for the following components:      Result Value   aPTT 55 (*)    All other components within normal limits  CBC - Abnormal; Notable for the following components:   WBC 15.7 (*)    RBC 3.10 (*)    Hemoglobin  10.0 (*)    HCT 32.4 (*)    MCV 104.5 (*)    All other components within normal limits  DIFFERENTIAL - Abnormal; Notable for the following components:   Neutro Abs 9.9 (*)    Abs Immature Granulocytes 1.40 (*)    All other components within normal limits  COMPREHENSIVE METABOLIC PANEL - Abnormal; Notable for the following components:   BUN 31 (*)    Creatinine, Ser 1.28 (*)    Albumin 2.6 (*)    GFR, Estimated 41 (*)    All other components within normal limits  BLOOD GAS, VENOUS - Abnormal; Notable for the following components:   pCO2, Ven 40.8 (*)    pO2, Ven 161.0 (*)    Acid-base deficit 3.6 (*)    All other components within normal limits  VALPROIC ACID LEVEL - Abnormal; Notable for the following components:   Valproic Acid Lvl 38 (*)    All other components within normal limits  RESP PANEL BY RT-PCR (FLU A&B, COVID) ARPGX2  ETHANOL  PROTIME-INR  AMMONIA  RAPID URINE DRUG SCREEN, HOSP PERFORMED  URINALYSIS, ROUTINE W REFLEX MICROSCOPIC    EKG EKG Interpretation  Date/Time:  Thursday July 13 2021 17:12:25 EDT Ventricular Rate:  122 PR Interval:  161 QRS Duration: 127 QT Interval:  375 QTC Calculation: 521 R Axis:   72 Text Interpretation: Fast sinus arrhythmia Right bundle branch  block Since last tracing rate faster Confirmed by Linwood Dibbles [phone]) on 07/13/2021 5:33:46 PM  Radiology CT HEAD WO CONTRAST  Result Date: 07/13/2021 CLINICAL DATA:  Focal neuro deficit, > 6 hrs, stroke suspected EXAM: CT HEAD WITHOUT CONTRAST TECHNIQUE: Contiguous axial images were obtained from the base of the skull through the vertex without intravenous contrast. COMPARISON:  06/29/2021 FINDINGS: Brain: No evidence of acute infarction, hemorrhage, hydrocephalus, extra-axial collection or mass lesion/mass effect. Mild low-density changes within the periventricular and subcortical white matter compatible with chronic microvascular ischemic change. Mild diffuse cerebral volume loss.  Vascular: Atherosclerotic calcifications involving the large vessels of the skull base. No unexpected hyperdense vessel. Skull: Normal. Negative for fracture or focal lesion. Sinuses/Orbits: No acute finding. Other: None. IMPRESSION: 1. No acute intracranial findings. 2. Chronic microvascular ischemic change and cerebral volume loss. Electronically Signed   By: Duanne Guess D.O.   On: 07/13/2021 18:42   DG Chest Portable 1 View  Result Date: 07/13/2021 CLINICAL DATA:  Altered mental status. EXAM: PORTABLE CHEST 1 VIEW COMPARISON:  June 29, 2021 FINDINGS: Low lung volumes are noted. Mild to moderate severity areas of atelectasis and/or infiltrate are seen within the bilateral lung bases, right greater than left. There is no evidence of a pleural effusion or pneumothorax. The heart size and mediastinal contours are within normal limits. Radiopaque surgical clips are seen within the right upper quadrant. The visualized skeletal structures are unremarkable. IMPRESSION: Low lung volumes with mild to moderate severity bibasilar atelectasis and/or infiltrate, right greater than left. Electronically Signed   By: Aram Candela M.D.   On: 07/13/2021 20:47    Procedures .Critical Care  Date/Time: 07/13/2021 9:22 PM Performed by: Linwood Dibbles, MD Authorized by: Linwood Dibbles, MD   Critical care provider statement:    Critical care time (minutes):  30   Critical care was time spent personally by me on the following activities:  Discussions with consultants, evaluation of patient's response to treatment, examination of patient, ordering and performing treatments and interventions, ordering and review of laboratory studies, ordering and review of radiographic studies, pulse oximetry, re-evaluation of patient's condition, obtaining history from patient or surrogate and review of old charts   Medications Ordered in ED Medications  sodium chloride 0.9 % bolus 500 mL (500 mLs Intravenous New Bag/Given 07/13/21 1758)     Followed by  0.9 %  sodium chloride infusion (has no administration in time range)  cefTRIAXone (ROCEPHIN) 1 g in sodium chloride 0.9 % 100 mL IVPB (has no administration in time range)  azithromycin (ZITHROMAX) 500 mg in sodium chloride 0.9 % 250 mL IVPB (has no administration in time range)    ED Course  I have reviewed the triage vital signs and the nursing notes.  Pertinent labs & imaging results that were available during my care of the patient were reviewed by me and considered in my medical decision making (see chart for details).  Clinical Course as of 07/13/21 2120  Thu Jul 13, 2021  2118 Chest x-ray with possible atelectasis versus infiltrate [JK]  2118 Metabolic panel notable for increased BUN and creatinine [JK]  2118 Valproic acid level slightly subtherapeutic [JK]  2118 Venous blood gas does not show any evidence of hypercarbia [JK]  2119  or significant hypoxia [JK]  2119 COVID and flu are negative [JK]  2119 Head CT without acute findings [JK]    Clinical Course User Index [JK] Linwood Dibbles, MD   MDM Rules/Calculators/A&P  Patient presented to the ED for evaluation of altered mental status.  Patient with significant confusion in the ED.  Change from her baseline per family.  Patient was noted to have new oxygen requirement.  Laboratory tests were notable for elevated BUN and creatinine.  Likely a component of dehydration.  No signs of severe hyponatremia.  No signs of hypercarbia or severe hypoxia.  COVID and flu are negative.  Head CT without signs of hemorrhage or other acute abnormality.  Mental status change may be multifactorial.  Pneumonia and infection is a concern.  Cannot completely rule out occult stroke.  History of prior seizures but no known seizures today and none witnessed in the ED.  We will start patient on IV fluids as well as antibiotics.  I will consult medical service for admission further treatment Final Clinical  Impression(s) / ED Diagnoses Final diagnoses:  Community acquired pneumonia, unspecified laterality  Altered mental status, unspecified altered mental status type  Dehydration     Linwood Dibbles, MD 07/13/21 2122

## 2021-07-13 NOTE — ED Notes (Signed)
Patients son would like a call back with a follow up on the patients care.  Genice Rouge  [phone]

## 2021-07-13 NOTE — ED Triage Notes (Signed)
Pt to ED via EMS from [facility]. Recent hospitalization for uti, off abx for 1 week. C/o AMS today, that started 2 days ago. Pt currently intermittently verbal/make noise /responds to pain. Unknown baseline orientation. On EMS arrival pt o2 sats 60s RA, Placed on 5L; 94% hx:`CHF, Kidney disease. Cbg 96. Last VS RR22,HR- irregular 80-120bpm, 137/72 bp. MOST FORM Accompanies pt

## 2021-07-13 NOTE — ED Notes (Signed)
PureWick placed on patient. 

## 2021-07-13 NOTE — ED Notes (Signed)
Report called however room is not clean. Will await for the room to be cleaned and then will transport pt

## 2021-07-13 NOTE — Progress Notes (Signed)
Arrived at room for IV start per consult request.  Bedside RN has started IV and drawing blood.  Requested completed.

## 2021-07-14 ENCOUNTER — Other Ambulatory Visit: Payer: Self-pay

## 2021-07-14 DIAGNOSIS — Z7189 Other specified counseling: Secondary | ICD-10-CM

## 2021-07-14 DIAGNOSIS — Z8249 Family history of ischemic heart disease and other diseases of the circulatory system: Secondary | ICD-10-CM | POA: Diagnosis not present

## 2021-07-14 DIAGNOSIS — I10 Essential (primary) hypertension: Secondary | ICD-10-CM | POA: Diagnosis present

## 2021-07-14 DIAGNOSIS — J9601 Acute respiratory failure with hypoxia: Secondary | ICD-10-CM

## 2021-07-14 DIAGNOSIS — I248 Other forms of acute ischemic heart disease: Secondary | ICD-10-CM | POA: Diagnosis present

## 2021-07-14 DIAGNOSIS — E860 Dehydration: Secondary | ICD-10-CM

## 2021-07-14 DIAGNOSIS — G40909 Epilepsy, unspecified, not intractable, without status epilepticus: Secondary | ICD-10-CM | POA: Diagnosis present

## 2021-07-14 DIAGNOSIS — Z7401 Bed confinement status: Secondary | ICD-10-CM | POA: Diagnosis not present

## 2021-07-14 DIAGNOSIS — R262 Difficulty in walking, not elsewhere classified: Secondary | ICD-10-CM

## 2021-07-14 DIAGNOSIS — R6520 Severe sepsis without septic shock: Secondary | ICD-10-CM

## 2021-07-14 DIAGNOSIS — M130 Polyarthritis, unspecified: Secondary | ICD-10-CM | POA: Diagnosis present

## 2021-07-14 DIAGNOSIS — E785 Hyperlipidemia, unspecified: Secondary | ICD-10-CM | POA: Diagnosis present

## 2021-07-14 DIAGNOSIS — D539 Nutritional anemia, unspecified: Secondary | ICD-10-CM | POA: Diagnosis present

## 2021-07-14 DIAGNOSIS — R5381 Other malaise: Secondary | ICD-10-CM | POA: Diagnosis not present

## 2021-07-14 DIAGNOSIS — E870 Hyperosmolality and hypernatremia: Secondary | ICD-10-CM

## 2021-07-14 DIAGNOSIS — A419 Sepsis, unspecified organism: Principal | ICD-10-CM

## 2021-07-14 DIAGNOSIS — N179 Acute kidney failure, unspecified: Secondary | ICD-10-CM | POA: Diagnosis present

## 2021-07-14 DIAGNOSIS — G9340 Encephalopathy, unspecified: Secondary | ICD-10-CM

## 2021-07-14 DIAGNOSIS — I2510 Atherosclerotic heart disease of native coronary artery without angina pectoris: Secondary | ICD-10-CM | POA: Diagnosis present

## 2021-07-14 DIAGNOSIS — E871 Hypo-osmolality and hyponatremia: Secondary | ICD-10-CM | POA: Diagnosis present

## 2021-07-14 DIAGNOSIS — R5383 Other fatigue: Secondary | ICD-10-CM | POA: Diagnosis not present

## 2021-07-14 DIAGNOSIS — M109 Gout, unspecified: Secondary | ICD-10-CM | POA: Diagnosis not present

## 2021-07-14 DIAGNOSIS — Z87898 Personal history of other specified conditions: Secondary | ICD-10-CM | POA: Diagnosis not present

## 2021-07-14 DIAGNOSIS — J690 Pneumonitis due to inhalation of food and vomit: Secondary | ICD-10-CM | POA: Diagnosis present

## 2021-07-14 DIAGNOSIS — I4510 Unspecified right bundle-branch block: Secondary | ICD-10-CM | POA: Diagnosis present

## 2021-07-14 DIAGNOSIS — M810 Age-related osteoporosis without current pathological fracture: Secondary | ICD-10-CM | POA: Diagnosis present

## 2021-07-14 DIAGNOSIS — Z833 Family history of diabetes mellitus: Secondary | ICD-10-CM | POA: Diagnosis not present

## 2021-07-14 DIAGNOSIS — R1310 Dysphagia, unspecified: Secondary | ICD-10-CM | POA: Diagnosis present

## 2021-07-14 DIAGNOSIS — G9341 Metabolic encephalopathy: Secondary | ICD-10-CM | POA: Diagnosis present

## 2021-07-14 DIAGNOSIS — I712 Thoracic aortic aneurysm, without rupture: Secondary | ICD-10-CM | POA: Diagnosis present

## 2021-07-14 DIAGNOSIS — J189 Pneumonia, unspecified organism: Secondary | ICD-10-CM

## 2021-07-14 DIAGNOSIS — Z66 Do not resuscitate: Secondary | ICD-10-CM | POA: Diagnosis not present

## 2021-07-14 DIAGNOSIS — Z20822 Contact with and (suspected) exposure to covid-19: Secondary | ICD-10-CM | POA: Diagnosis present

## 2021-07-14 DIAGNOSIS — R4182 Altered mental status, unspecified: Secondary | ICD-10-CM | POA: Diagnosis not present

## 2021-07-14 DIAGNOSIS — E162 Hypoglycemia, unspecified: Secondary | ICD-10-CM

## 2021-07-14 LAB — COMPREHENSIVE METABOLIC PANEL
ALT: 11 U/L (ref 0–44)
AST: 26 U/L (ref 15–41)
Albumin: 2.4 g/dL — ABNORMAL LOW (ref 3.5–5.0)
Alkaline Phosphatase: 54 U/L (ref 38–126)
Anion gap: 10 (ref 5–15)
BUN: 31 mg/dL — ABNORMAL HIGH (ref 8–23)
CO2: 22 mmol/L (ref 22–32)
Calcium: 8.6 mg/dL — ABNORMAL LOW (ref 8.9–10.3)
Chloride: 114 mmol/L — ABNORMAL HIGH (ref 98–111)
Creatinine, Ser: 0.84 mg/dL (ref 0.44–1.00)
GFR, Estimated: >60 mL/min (ref >60)
Glucose, Bld: 64 mg/dL — ABNORMAL LOW (ref 70–99)
Potassium: 4.3 mmol/L (ref 3.5–5.1)
Sodium: 146 mmol/L — ABNORMAL HIGH (ref 135–145)
Total Bilirubin: 0.5 mg/dL (ref 0.3–1.2)
Total Protein: 6.4 g/dL — ABNORMAL LOW (ref 6.5–8.1)

## 2021-07-14 LAB — CBC
HCT: 24.3 % — ABNORMAL LOW (ref 36.0–46.0)
Hemoglobin: 7.7 g/dL — ABNORMAL LOW (ref 12.0–15.0)
MCH: 32.9 pg (ref 26.0–34.0)
MCHC: 31.7 g/dL (ref 30.0–36.0)
MCV: 103.8 fL — ABNORMAL HIGH (ref 80.0–100.0)
Platelets: 186 10*3/uL (ref 150–400)
RBC: 2.34 MIL/uL — ABNORMAL LOW (ref 3.87–5.11)
RDW: 14.6 % (ref 11.5–15.5)
WBC: 15.3 10*3/uL — ABNORMAL HIGH (ref 4.0–10.5)
nRBC: 0 % (ref 0.0–0.2)

## 2021-07-14 LAB — TROPONIN I (HIGH SENSITIVITY): Troponin I (High Sensitivity): 26 ng/L — ABNORMAL HIGH (ref <18)

## 2021-07-14 LAB — STREP PNEUMONIAE URINARY ANTIGEN: Strep Pneumo Urinary Antigen: NEGATIVE

## 2021-07-14 LAB — HIV ANTIBODY (ROUTINE TESTING W REFLEX): HIV Screen 4th Generation wRfx: NONREACTIVE

## 2021-07-14 LAB — MAGNESIUM: Magnesium: 1.4 mg/dL — ABNORMAL LOW (ref 1.7–2.4)

## 2021-07-14 MED ORDER — DEXTROSE-NACL 5-0.45 % IV SOLN
INTRAVENOUS | Status: DC
Start: 2021-07-14 — End: 2021-07-18

## 2021-07-14 MED ORDER — ROSUVASTATIN CALCIUM 10 MG PO TABS
10.0000 mg | ORAL_TABLET | Freq: Every day | ORAL | Status: DC
  Administered 2021-07-14 – 2021-07-18 (×5): 10 mg via ORAL
  Filled 2021-07-14 (×5): qty 1

## 2021-07-14 MED ORDER — HEPARIN SODIUM (PORCINE) 5000 UNIT/ML IJ SOLN
5000.0000 [IU] | Freq: Three times a day (TID) | INTRAMUSCULAR | Status: DC
  Administered 2021-07-14 – 2021-07-17 (×10): 5000 [IU] via SUBCUTANEOUS
  Filled 2021-07-14 (×10): qty 1

## 2021-07-14 MED ORDER — CEFTRIAXONE SODIUM 2 G IJ SOLR
2.0000 g | INTRAMUSCULAR | Status: DC
  Administered 2021-07-14 – 2021-07-17 (×4): 2 g via INTRAVENOUS
  Filled 2021-07-14 (×3): qty 2
  Filled 2021-07-14: qty 20

## 2021-07-14 MED ORDER — FERROUS SULFATE 325 (65 FE) MG PO TABS
325.0000 mg | ORAL_TABLET | Freq: Every day | ORAL | Status: DC
  Administered 2021-07-14 – 2021-07-18 (×5): 325 mg via ORAL
  Filled 2021-07-14 (×5): qty 1

## 2021-07-14 MED ORDER — THIAMINE HCL 100 MG PO TABS
100.0000 mg | ORAL_TABLET | Freq: Every day | ORAL | Status: DC
  Administered 2021-07-14 – 2021-07-18 (×5): 100 mg via ORAL
  Filled 2021-07-14 (×5): qty 1

## 2021-07-14 MED ORDER — VALPROIC ACID 250 MG/5ML PO SOLN
750.0000 mg | Freq: Two times a day (BID) | ORAL | Status: DC
  Administered 2021-07-14 – 2021-07-18 (×8): 750 mg via ORAL
  Filled 2021-07-14 (×10): qty 15

## 2021-07-14 MED ORDER — CARVEDILOL 3.125 MG PO TABS
3.1250 mg | ORAL_TABLET | Freq: Two times a day (BID) | ORAL | Status: DC
  Administered 2021-07-14 – 2021-07-18 (×9): 3.125 mg via ORAL
  Filled 2021-07-14 (×9): qty 1

## 2021-07-14 MED ORDER — METOPROLOL TARTRATE 5 MG/5ML IV SOLN
2.5000 mg | Freq: Once | INTRAVENOUS | Status: AC
  Administered 2021-07-14: 2.5 mg via INTRAVENOUS
  Filled 2021-07-14: qty 5

## 2021-07-14 MED ORDER — PANTOPRAZOLE SODIUM 40 MG PO TBEC
40.0000 mg | DELAYED_RELEASE_TABLET | Freq: Every day | ORAL | Status: DC
  Administered 2021-07-14 – 2021-07-18 (×5): 40 mg via ORAL
  Filled 2021-07-14 (×5): qty 1

## 2021-07-14 MED ORDER — MAGNESIUM SULFATE 4 GM/100ML IV SOLN
4.0000 g | Freq: Once | INTRAVENOUS | Status: AC
  Administered 2021-07-14: 4 g via INTRAVENOUS
  Filled 2021-07-14: qty 100

## 2021-07-14 MED ORDER — AMLODIPINE BESYLATE 5 MG PO TABS
5.0000 mg | ORAL_TABLET | Freq: Every day | ORAL | Status: DC
  Administered 2021-07-14 – 2021-07-18 (×5): 5 mg via ORAL
  Filled 2021-07-14 (×5): qty 1

## 2021-07-14 MED ORDER — DIVALPROEX SODIUM 250 MG PO DR TAB
750.0000 mg | DELAYED_RELEASE_TABLET | Freq: Two times a day (BID) | ORAL | Status: DC
  Administered 2021-07-14: 750 mg via ORAL
  Filled 2021-07-14: qty 3

## 2021-07-14 MED ORDER — SODIUM CHLORIDE 0.9 % IV SOLN: INTRAVENOUS | Status: DC

## 2021-07-14 MED ORDER — SODIUM CHLORIDE 0.9 % IV SOLN
500.0000 mg | INTRAVENOUS | Status: DC
  Administered 2021-07-15 – 2021-07-17 (×3): 500 mg via INTRAVENOUS
  Filled 2021-07-14 (×4): qty 500

## 2021-07-14 MED ORDER — ENSURE ENLIVE PO LIQD
237.0000 mL | Freq: Two times a day (BID) | ORAL | Status: DC
  Administered 2021-07-14 – 2021-07-18 (×8): 237 mL via ORAL

## 2021-07-14 MED ORDER — ACETAMINOPHEN 160 MG/5ML PO SOLN
650.0000 mg | Freq: Four times a day (QID) | ORAL | Status: DC | PRN
  Administered 2021-07-14 – 2021-07-17 (×5): 650 mg via ORAL
  Filled 2021-07-14 (×5): qty 20.3

## 2021-07-14 NOTE — Progress Notes (Signed)
   07/14/21 1313  Assess: MEWS Score  Temp (!) 100.7 F (38.2 C)  BP (!) 119/58  Pulse Rate (!) 106  Resp 16  SpO2 100 %  O2 Device Room Air  Patient Activity (if Appropriate) In bed  Assess: MEWS Score  MEWS Temp 1  MEWS Systolic 0  MEWS Pulse 1  MEWS RR 0  MEWS LOC 0  MEWS Score 2  MEWS Score Color Yellow  Assess: if the MEWS score is Yellow or Red  Were vital signs taken at a resting state? Yes  Focused Assessment No change from prior assessment  Does the patient meet 2 or more of the SIRS criteria? No  Does the patient have a confirmed or suspected source of infection? Yes  Provider and Rapid Response Notified? No (pt. was previous yellow in 24hrs)  MEWS guidelines implemented *See Row Information* No, previously yellow, continue vital signs every 4 hours  Treat  MEWS Interventions Administered prn meds/treatments (tylenol PRN given)  Pain Scale Faces  Pain Score 0  Take Vital Signs  Increase Vital Sign Frequency  Yellow: Q 2hr X 2 then Q 4hr X 2, if remains yellow, continue Q 4hrs  Escalate  MEWS: Escalate Yellow: discuss with charge nurse/RN and consider discussing with provider and RRT  Notify: Charge Nurse/RN  Name of Charge Nurse/RN Notified Kasia RN  Date Charge Nurse/RN Notified 07/14/21  Time Charge Nurse/RN Notified 1322  Notify: Provider  Provider Name/Title Ginfa MD  Date Provider Notified 07/14/21  Time Provider Notified 1322  Notification Type Page  Notification Reason Other (Comment) (continuation of yellow MEWS and Tylenol order for fever)  Provider response See new orders  Date of Provider Response 07/14/21  Time of Provider Response 1323  Document  Patient Outcome Stabilized after interventions (Tylenol PRN geven will monitor closely)  Progress note created (see row info) Yes  Assess: SIRS CRITERIA  SIRS Temperature  0  SIRS Pulse 1  SIRS Respirations  0  SIRS WBC 1  SIRS Score Sum  2   Pt. Will continue on yellow MEWS protocol, V/S  taken every 4hrs. Will closely monitor pt.

## 2021-07-14 NOTE — Evaluation (Signed)
Physical Therapy Evaluation Patient Details Name: [name] MRN: [id number] DOB: [birth year] Today's Date: 07/14/2021   History of Present Illness  87 y.o. female admitted from SNF with AMS. Dx of AKI, PNA. Pt with medical history significant of hypertension, chronic kidney disease stage II, hyperlipidemia, prediabetes. Recent DC 07/03/21 for metabolic encephalopathy, UTi.  Clinical Impression  Pt admitted with above diagnosis. Pt is lethargic, did not respond to verbal/tactile stimulation, did not open eyes during PT eval. Attempted gentle ROM to BUE/LEs, however pt moaned in pain and resisted attempts at movement. Daughter present and stated pt has been using a WC since February, prior to that she could walk. OA in B knees limited activity tolerance. Will follow on a trial basis as pt presently isn't able to participate in PT.  Pt currently with functional limitations due to the deficits listed below (see PT Problem List). Pt will benefit from skilled PT to increase their independence and safety with mobility to allow discharge to the venue listed below.       Follow Up Recommendations SNF;Supervision/Assistance - 24 hour    Equipment Recommendations  None recommended by PT    Recommendations for Other Services       Precautions / Restrictions Precautions Precautions: Fall Restrictions Weight Bearing Restrictions: No      Mobility  Bed Mobility               General bed mobility comments: did not attempt, pt not alert, not responsive to verbal/tactile stimulii    Transfers                 General transfer comment: lift equipment recommended  Ambulation/Gait                Stairs            Wheelchair Mobility    Modified Rankin (Stroke Patients Only)       Balance                                             Pertinent Vitals/Pain Pain Assessment: Faces Faces Pain Scale: Hurts even more Pain Location: BLEs with  minimal movement Pain Descriptors / Indicators: Moaning;Guarding Pain Intervention(s): Limited activity within patient's tolerance;Monitored during session;Repositioned    Home Living Family/patient expects to be discharged to:: Skilled nursing facility                 Additional Comments: [name].    Prior Function Level of Independence: Needs assistance   Gait / Transfers Assistance Needed: Use a w/c primarily. Staff transfering her to w/c  ADL's / Homemaking Assistance Needed: Assist with ADLs; can feed herself but takes a long time  Comments: Was using rollator for mobility prior to feb, OA in B knees has limited her tolerance of walking since February per daughter     Hand Dominance   Dominant Hand: Right    Extremity/Trunk Assessment   Upper Extremity Assessment Upper Extremity Assessment: Difficult to assess due to impaired cognition    Lower Extremity Assessment Lower Extremity Assessment: Difficult to assess due to impaired cognition (resisted movement of BUE/LEs, moaned in pain with minimal movement)       Communication   Communication: HOH  Cognition Arousal/Alertness: Lethargic Behavior During Therapy: Flat affect Overall Cognitive Status: Impaired/Different from baseline Area of Impairment: Orientation;Attention;Memory;Following commands;Safety/judgement;Awareness;Problem solving  Orientation Level: Person;Place;Time;Situation;Disoriented to     Following Commands: Follows one step commands inconsistently     Problem Solving: Slow processing;Decreased initiation;Difficulty sequencing;Requires verbal cues;Requires tactile cues General Comments: no response to verbal commands nor to questions, would not open eyes with verbal/tactile stimulation, moaned in pain with minimal movement of BUE/LEs      General Comments      Exercises General Exercises - Lower Extremity Ankle Circles/Pumps: PROM;Both;10 reps;Supine Heel  Slides: PROM;Both;5 reps;Supine (pt resisted PROM) Hip ABduction/ADduction: PROM;Both;10 reps;Supine   Assessment/Plan    PT Assessment Patient needs continued PT services  PT Problem List Decreased mobility;Decreased activity tolerance;Pain       PT Treatment Interventions Therapeutic activities;Functional mobility training;Therapeutic exercise;Patient/family education    PT Goals (Current goals can be found in the Care Plan section)  Acute Rehab PT Goals Patient Stated Goal: for pt to be able to sit up and get into [facility] PT Goal Formulation: With family Time For Goal Achievement: 07/28/21 Potential to Achieve Goals: Fair    Frequency Min 2X/week   Barriers to discharge        Co-evaluation               AM-PAC PT "6 Clicks" Mobility  Outcome Measure Help needed turning from your back to your side while in a flat bed without using bedrails?: Total Help needed moving from lying on your back to sitting on the side of a flat bed without using bedrails?: Total Help needed moving to and from a bed to a chair (including a wheelchair)?: Total Help needed standing up from a chair using your arms (e.g., wheelchair or bedside chair)?: Total Help needed to walk in hospital room?: Total Help needed climbing 3-5 steps with a railing? : Total 6 Click Score: 6    End of Session   Activity Tolerance: Patient limited by pain;Patient limited by lethargy Patient left: in bed;with bed alarm set;with family/visitor present Nurse Communication: Need for lift equipment;Mobility status PT Visit Diagnosis: Pain;Adult, failure to thrive (R62.7);Other abnormalities of gait and mobility (R26.89)    Time: 5945-8592 PT Time Calculation (min) (ACUTE ONLY): 23 min   Charges:   PT Evaluation $PT Eval Moderate Complexity: 1 Mod PT Treatments $Therapeutic Exercise: 8-22 mins       Ralene Bathe Kistler PT 07/14/2021  Acute Rehabilitation Services Pager [phone] Office  [phone]

## 2021-07-14 NOTE — Evaluation (Signed)
Clinical/Bedside Swallow Evaluation Patient Details  Name: [name] MRN: [id number] Date of Birth: [birth year]  Today's Date: 07/14/2021 Time: SLP Start Time (ACUTE ONLY): 0932 SLP Stop Time (ACUTE ONLY): 0952 SLP Time Calculation (min) (ACUTE ONLY): 20 min  Past Medical History:  Past Medical History:  Diagnosis Date   Anemia    iron deficiency   Aneurysm, thoracic aortic (HCC)    Anxiety    B12 deficiency    CAD (coronary artery disease)    s/p stenting of LAD 1999- cath 5-08 EF normal LAD 30-40% restenosis. D1 50% D2 80% LCX & RCA minimal plaque   Chronic back pain    Constipation    Depression    GERD (gastroesophageal reflux disease)    Gout    HTN (hypertension)    Hyperlipemia    Obesity    Osteoporosis    Pancreatitis    Polyarthritis    DJD/ possible PMR   Renal insufficiency    Cr 1.2-1.3   Seizures (HCC)    Tinnitus    Urinary frequency    Vertigo    Vitamin D deficiency    Past Surgical History:  Past Surgical History:  Procedure Laterality Date   ABDOMINAL HYSTERECTOMY     CARDIAC CATHETERIZATION  2008   L main 20%, LAD stent patent, D1 50%, D2 80% (small), RCA 20%, EF 55-60%   CHOLECYSTECTOMY     CORONARY ANGIOPLASTY WITH STENT PLACEMENT  1999   LAD stent   CORONARY STENT INTERVENTION N/A 10/16/2019   Procedure: CORONARY STENT INTERVENTION;  Surgeon: Marykay Lex, MD;  Location: [facility];  Service: Cardiovascular;  Laterality: N/A;   HEMORRHOID SURGERY     LAPAROSCOPIC LYSIS OF ADHESIONS N/A 03/24/2015   Procedure: LAPAROSCOPIC LYSIS OF ADHESIONS;  Surgeon: Luretha Murphy, MD;  Location: [facility];  Service: General;  Laterality: N/A;   LAPAROSCOPY N/A 03/24/2015   Procedure: LAPAROSCOPY DIAGNOSTIC;  Surgeon: Luretha Murphy, MD;  Location: [facility];  Service: General;  Laterality: N/A;   LAPAROTOMY N/A 03/24/2015   Procedure: LAPAROTOMY with decompression of bowel;  Surgeon: Luretha Murphy, MD;  Location: [facility];  Service: General;   Laterality: N/A;   LEFT HEART CATH AND CORONARY ANGIOGRAPHY N/A 10/16/2019   Procedure: LEFT HEART CATH AND CORONARY ANGIOGRAPHY;  Surgeon: Marykay Lex, MD;  Location: [facility] INVASIVE CV [facility];  Service: Cardiovascular;  Laterality: N/A;   TUBAL LIGATION     HPI:  Pt is a 87 y.o. female who presents the ED from nursing facility with a chief complaint of altered mental status. EMS noted on arrival that pt's oxygen saturation was in the 60s. Son reports that pt has became nonverbal, is not eating, and appears short of breath, which is a change from baseline. CT Head (07/13/21) revealed no acute intracranial abnormality.  Chest xray (07/13/21) signficant for "Low lung volumes with mild to moderate severity bibasilar  atelectasis and/or infiltrate, right greater than left".  PMH: thoracic aortic aneurysm, anxiety, B12 deficiency, coronary artery disease, GERD, hypertension, hyperlipidemia, renal insufficiency, seizure disorder, TIA, MBS (2016) revealed mild oral dysphagia with recommendation of dysphagia 3, thin liquid diet.   Assessment / Plan / Recommendation Clinical Impression  Pt asleep and difficult to awaken for POs this am. She is edentulous and oral cavity is significant for xerostomia. Max verbal and tactile stimualtion cues with wash cloth to face and ice chip to lips eventually aroused pt enough to follow basic commands for bites/sips, but she largely maintained  closed or slight eye opening throughout evaluation. Ice chips and bites of puree were promptly manipulated and orally cleared post swallow trigger. Initial difficulty noted with coordinating straw sips of thin liquids but with mod- max verbal cues, she consumed consecutive sips exhibiting no overt s/sx of aspiration. No further trials attempted due to increasing lethargy. Will initiate dysphagia 1, thin liquid diet. Notifed RN that pt needs to be alert for any PO intake, sitting fully upright. If lethargy increases when assisting pt with POs,  terminate intake at that time to reduce risk for aspiration. SLP to f/u.  SLP Visit Diagnosis: Dysphagia, unspecified (R13.10)    Aspiration Risk  Mild aspiration risk (mild-mod)    Diet Recommendation Dysphagia 1 (Puree);Thin liquid   Liquid Administration via: Straw;Cup Medication Administration: Crushed with puree Supervision: Full supervision/cueing for compensatory strategies Compensations: Minimize environmental distractions;Slow rate;Small sips/bites Postural Changes: Seated upright at 90 degrees    Other  Recommendations Oral Care Recommendations: Oral care QID   Follow up Recommendations Other (comment) (TBD)      Frequency and Duration min 2x/week  2 weeks       Prognosis Prognosis for Safe Diet Advancement: Fair Barriers to Reach Goals: Cognitive deficits;Time post onset      Swallow Study   General Date of Onset: 07/13/21 HPI: Pt is a 87 y.o. female who presents the ED from nursing facility with a chief complaint of altered mental status. EMS noted on arrival that pt's oxygen saturation was in the 60s. Son reports that pt has became nonverbal, is not eating, and appears short of breath, which is a change from baseline. CT Head (07/13/21) revealed no acute intracranial abnormality.  Chest xray (07/13/21) signficant for "Low lung volumes with mild to moderate severity bibasilar  atelectasis and/or infiltrate, right greater than left".  PMH: thoracic aortic aneurysm, anxiety, B12 deficiency, coronary artery disease, GERD, hypertension, hyperlipidemia, renal insufficiency, seizure disorder, TIA, MBS (2016) revealed mild oral dysphagia with recommendation of dysphagia 3, thin liquid diet. Type of Study: Bedside Swallow Evaluation Previous Swallow Assessment: see HPI Diet Prior to this Study: NPO Temperature Spikes Noted: No Respiratory Status: Nasal cannula History of Recent Intubation: No Behavior/Cognition: Lethargic/Drowsy;Confused Oral Cavity Assessment: Dry Oral Care  Completed by SLP: No Oral Cavity - Dentition: Edentulous Vision:  (unable to assess) Self-Feeding Abilities: Total assist Patient Positioning: Upright in bed;Postural control adequate for testing Baseline Vocal Quality: Other (comment) (pt very lethargic and did not vocalize independently or on commands other than open mouth breathing sounds) Volitional Cough: Cognitively unable to elicit Volitional Swallow: Unable to elicit    Oral/Motor/Sensory Function Overall Oral Motor/Sensory Function: Other (comment) (unable to fully assess due to lethargy, poor mentation)   Ice Chips Ice chips: Within functional limits Presentation: Spoon   Thin Liquid Thin Liquid: Within functional limits Presentation: Cup;Straw    Nectar Thick Nectar Thick Liquid: Not tested   Honey Thick Honey Thick Liquid: Not tested   Puree Puree: Within functional limits Presentation: Spoon   Solid     Solid: Not tested     Avie Echevaria, MA, CCC-SLP Acute Rehabilitation Services Office Number: [phone]  Paulette Blanch 07/14/2021,10:19 AM

## 2021-07-14 NOTE — Plan of Care (Signed)
  Problem: Clinical Measurements: Goal: Respiratory complications will improve Outcome: Progressing   Problem: Pain Managment: Goal: General experience of comfort will improve Outcome: Progressing   Problem: Skin Integrity: Goal: Risk for impaired skin integrity will decrease Outcome: Progressing   Problem: Respiratory: Goal: Ability to maintain adequate ventilation will improve 07/14/2021 0216 by Hortencia Pilar, RN Outcome: Progressing 07/14/2021 0215 by Hortencia Pilar, RN Outcome: Progressing

## 2021-07-14 NOTE — H&P (Addendum)
TRH H&P    Patient Demographics:    [name], is a 87 y.o. female  MRN: [id number]  DOB - [birth year]  Admit Date - 07/13/2021  Referring MD/NP/PA: Lynelle Doctor  Outpatient Primary MD for the patient is Plotnikov, [name], MD  Patient coming from: [facility]  Chief complaint-  altered mental status   HPI:    [name]  is a 87 y.o. female, with history of thoracic aortic aneurysm, anxiety, B12 deficiency, coronary artery disease, GERD, hypertension, hyperlipidemia, renal insufficiency, seizure disorder, and more presents the ED with a chief complaint of altered mental status.  Son is at bedside, but does not know a lot about why she came in.  He reports at baseline patient can hold a normal conversation, recognize people around her, but she is not ambulatory.  He reports that she was nonverbal today, not eating, and appeared short of breath.  He reports that she had the similar symptoms on Monday, was better on Tuesday, and then symptoms started up again on Wednesday of this week.  He is not sure if she has been coughing, had any nausea, had any diarrhea, or any other symptoms.  He does note that she does not wear oxygen at baseline.  Unfortunately due to patient's altered mental status, no further history could be obtained.  Chart review does reveal that when EMS picked her up from the nursing facility they noted her to have an oxygen saturation of 60%.  They started her on 5 L nasal cannula.  She was able to be weaned down to 3 L nasal cannula in the ER. Chart review also reveals that patient was recently discharged on August 22 after an admission for acute metabolic encephalopathy.  There was concern for urinary tract infection for which she was started on antibiotics, or possible seizure.  Her Depakote level was subtherapeutic at that time.  Her Depakote was increased to 750 mg p.o. twice daily from 500 mg p.o.  twice daily at that admission.  In the ED Temp 98.9, heart rate 64-94, respiratory rate 18, blood pressure 152/67, satting at 96% on 3 L nasal cannula Leukocytosis with a white blood cell count of 15.7, hemoglobin 10.0 Chemistry panel reveals a elevated creatinine of 1.28, borderline hypoglycemia with a glucose of 71 Valproic acid slightly subtherapeutic at 38 Alcohol level less than 10 CT head shows no acute findings Chest x-ray shows atelectasis versus infiltrate EKG shows a heart rate of 122, sinus arrhythmia, QTC 521, with redemonstrated right bundle Patient was started on Rocephin and Zithromax Admission requested for further management of pneumonia and acute metabolic encephalopathy   Review of systems:    Unfortunately review of systems could not be obtained secondary to patient's altered mental status    Past History of the following :    Past Medical History:  Diagnosis Date   Anemia    iron deficiency   Aneurysm, thoracic aortic (HCC)    Anxiety    B12 deficiency    CAD (coronary artery disease)    s/p stenting  of LAD 1999- cath 5-08 EF normal LAD 30-40% restenosis. D1 50% D2 80% LCX & RCA minimal plaque   Chronic back pain    Constipation    Depression    GERD (gastroesophageal reflux disease)    Gout    HTN (hypertension)    Hyperlipemia    Obesity    Osteoporosis    Pancreatitis    Polyarthritis    DJD/ possible PMR   Renal insufficiency    Cr 1.2-1.3   Seizures (HCC)    Tinnitus    Urinary frequency    Vertigo    Vitamin D deficiency       Past Surgical History:  Procedure Laterality Date   ABDOMINAL HYSTERECTOMY     CARDIAC CATHETERIZATION  2008   L main 20%, LAD stent patent, D1 50%, D2 80% (small), RCA 20%, EF 55-60%   CHOLECYSTECTOMY     CORONARY ANGIOPLASTY WITH STENT PLACEMENT  1999   LAD stent   CORONARY STENT INTERVENTION N/A 10/16/2019   Procedure: CORONARY STENT INTERVENTION;  Surgeon: Marykay Lex, MD;  Location: [facility] INVASIVE CV  LAB;  Service: Cardiovascular;  Laterality: N/A;   HEMORRHOID SURGERY     LAPAROSCOPIC LYSIS OF ADHESIONS N/A 03/24/2015   Procedure: LAPAROSCOPIC LYSIS OF ADHESIONS;  Surgeon: Luretha Murphy, MD;  Location: [facility] ORS;  Service: General;  Laterality: N/A;   LAPAROSCOPY N/A 03/24/2015   Procedure: LAPAROSCOPY DIAGNOSTIC;  Surgeon: Luretha Murphy, MD;  Location: [facility] ORS;  Service: General;  Laterality: N/A;   LAPAROTOMY N/A 03/24/2015   Procedure: LAPAROTOMY with decompression of bowel;  Surgeon: Luretha Murphy, MD;  Location: [facility] ORS;  Service: General;  Laterality: N/A;   LEFT HEART CATH AND CORONARY ANGIOGRAPHY N/A 10/16/2019   Procedure: LEFT HEART CATH AND CORONARY ANGIOGRAPHY;  Surgeon: Marykay Lex, MD;  Location: [facility] INVASIVE CV LAB;  Service: Cardiovascular;  Laterality: N/A;   TUBAL LIGATION        Social History:      Social History   Tobacco Use   Smoking status: Never   Smokeless tobacco: Never  Substance Use Topics   Alcohol use: No       Family History :     Family History  Adopted: Yes  Problem Relation Age of Onset   Diabetes Mother    Hypertension Father    Cancer Sister       Home Medications:   Prior to Admission medications   Medication Sig Start Date End Date Taking? Authorizing Provider  acetaminophen (TYLENOL) 500 MG tablet Take 1,000 mg by mouth 3 (three) times daily.    [provider]  amLODipine (NORVASC) 5 MG tablet Take 1 tablet (5 mg total) by mouth daily. 07/03/21   [place], Alvira Philips, DO  aspirin EC 81 MG tablet Take 1 tablet (81 mg total) by mouth daily. Swallow whole. 10/27/20   Rollene Rotunda, MD  B Complex-C (B-COMPLEX WITH VITAMIN C) tablet Take 1 tablet by mouth daily.    [provider]  carvedilol (COREG) 3.125 MG tablet Take 1 tablet (3.125 mg total) by mouth 2 (two) times daily with a meal. 05/11/21   Elgergawy, Leana Roe, MD  cholecalciferol (VITAMIN D) 25 MCG (1000 UNIT) tablet Take 1,000 Units by mouth daily.     [provider]  diclofenac Sodium (VOLTAREN) 1 % GEL Apply 1 application topically 2 (two) times daily. Back pain    [provider]  divalproex (DEPAKOTE) 125 MG DR tablet Take 6 tablets (750  mg total) by mouth 2 (two) times daily. 07/03/21   [place], Alvira Philips, DO  DULoxetine (CYMBALTA) 20 MG capsule Take 20 mg by mouth daily. 06/15/21   [provider]  feeding supplement, GLUCERNA SHAKE, (GLUCERNA SHAKE) LIQD Take 237 mLs by mouth 3 (three) times daily between meals. Patient not taking: Reported on 06/29/2021 02/09/20   Leroy Sea, MD  ferrous sulfate 325 (65 FE) MG tablet Take 1 tablet (325 mg total) by mouth daily. 03/21/21   Charlynne Pander, MD  HUMIRA PEN 40 MG/0.4ML PNKT Inject 40 mg into the skin every 14 (fourteen) days. On the 4th and 18th of the month 06/22/21   [provider]  ibuprofen (ADVIL) 800 MG tablet Take 1 tablet (800 mg total) by mouth every 8 (eight) hours as needed for moderate pain. 06/29/21   Bethann Berkshire, MD  pantoprazole (PROTONIX) 40 MG tablet Take 1 tablet (40 mg total) by mouth daily. 02/09/20   Leroy Sea, MD  Pollen Extracts (PROSTAT PO) Take 30 mLs by mouth 2 (two) times daily.    [provider]  polyethylene glycol (MIRALAX / GLYCOLAX) 17 g packet Take 17 g by mouth See admin instructions. 17g orally on Sunday, Monday, Wednesday, Friday, Saturday    [provider]  rosuvastatin (CRESTOR) 10 MG tablet Take 10 mg by mouth daily.    [provider]  senna-docusate (SENOKOT-S) 8.6-50 MG tablet Take 2 tablets by mouth 2 (two) times daily.    [provider]  thiamine 100 MG tablet Take 1 tablet (100 mg total) by mouth daily. 01/14/21   Ghimire, Werner Lean, MD     Allergies:     Allergies  Allergen Reactions   Ativan [Lorazepam] Other (See Comments)    Very confused   Tramadol Hcl Anxiety and Rash    Headache   Amlodipine Besylate Other (See Comments)     dizzy   Atenolol Other  (See Comments)    Fatigue   Benazepril Cough   Benicar [Olmesartan Medoxomil] Other (See Comments)    HEADACHE   Cozaar Nausea And Vomiting   Hydralazine Other (See Comments)    Hair loss   Hydrochlorothiazide Other (See Comments)   Hydrochlorothiazide W-Triamterene Other (See Comments)     dizzy   Hydrocodone Other (See Comments)    HEADACHE   Hydroxyzine Pamoate Other (See Comments)    Per MAR   Iodine Other (See Comments)    Per MAR   Lisinopril Other (See Comments) and Cough    Tired & fatigue   Peach Flavor Itching   Penicillins Itching    Has patient had a PCN reaction causing immediate rash, facial/tongue/throat swelling, SOB or lightheadedness with hypotension: No Has patient had a PCN reaction causing severe rash involving mucus membranes or skin necrosis: No Has patient had a PCN reaction that required hospitalization No Has patient had a PCN reaction occurring within the last 10 years: Yes If all of the above answers are "NO", then may proceed with Cephalosporin use.  tolerates cephalosporins OK    Pravastatin Other (See Comments)    Myalgias-muscle pain   Prednisolone Nausea Only and Other (See Comments)    Upset stomach   Strawberry Flavor Itching   Tramadol Hcl Other (See Comments)    headache   Benadryl [Diphenhydramine] Itching and Palpitations     Physical Exam:   Vitals  Blood pressure 132/65, pulse (!) 25, temperature 98.9 F (37.2 C), temperature source Axillary, resp. rate 18,  height  (1.651 m), weight 62.2 kg, SpO2 100 %. 1.  General: Patient lying supine in bed,  no acute distress   2. Psychiatric: Obtunded   3. Neurologic: Obtunded, protecting airway, face is symmetric, not following commands  4. HEENMT:  Head is atraumatic, normocephalic, pupils reactive to light, neck is supple, trachea is midline, mucous membranes are moist   5. Respiratory : Lungs are diminished at the bases bilaterally, no wheezing, rhonchi, rales, no  cyanosis, no increase in work of breathing or accessory muscle use, 3 L nasal cannula in place   6. Cardiovascular : Heart rate normal, rhythm is regular, no murmurs, rubs or gallops, no peripheral edema, peripheral pulses palpated   7. Gastrointestinal:  Abdomen is soft, nondistended, nontender to palpation bowel sounds active, no masses or organomegaly palpated   8. Skin:  Skin is warm, dry and intact without rashes, acute lesions, or ulcers on limited exam   9.Musculoskeletal:  No acute deformities or trauma, no asymmetry in tone, no peripheral edema, peripheral pulses palpated, no tenderness to palpation in the extremities     Data Review:    CBC Recent Labs  Lab 07/13/21 1706  WBC 15.7*  HGB 10.0*  HCT 32.4*  PLT 172  MCV 104.5*  MCH 32.3  MCHC 30.9  RDW 14.5  LYMPHSABS 3.5  MONOABS 0.6  EOSABS 0.3  BASOSABS 0.0   ------------------------------------------------------------------------------------------------------------------  Results for orders placed or performed during the hospital encounter of 07/13/21 (from the past 48 hour(s))  Ethanol     Status: None   Collection Time: 07/13/21  5:06 PM  Result Value Ref Range   Alcohol, Ethyl (B) <10 <10 mg/dL    Comment: (NOTE) Lowest detectable limit for serum alcohol is 10 mg/dL.  For medical purposes only. Performed at [facility], [address] [place], [place] [address]   Protime-INR     Status: None   Collection Time: 07/13/21  5:06 PM  Result Value Ref Range   Prothrombin Time 15.1 11.4 - 15.2 seconds   INR 1.2 0.8 - 1.2    Comment: (NOTE) INR goal varies based on device and disease states. Performed at [facility], [address], [place], [place] [address]   APTT     Status: Abnormal   Collection Time: 07/13/21  5:06 PM  Result Value Ref Range   aPTT 55 (H) 24 - 36 seconds    Comment:        IF BASELINE aPTT IS ELEVATED, SUGGEST PATIENT RISK  ASSESSMENT BE USED TO DETERMINE APPROPRIATE ANTICOAGULANT THERAPY. Performed at [facility], [address] [place], [place] [address]   CBC     Status: Abnormal   Collection Time: 07/13/21  5:06 PM  Result Value Ref Range   WBC 15.7 (H) 4.0 - 10.5 K/uL   RBC 3.10 (L) 3.87 - 5.11 MIL/uL   Hemoglobin 10.0 (L) 12.0 - 15.0 g/dL   HCT 91.4 (L) 78.2 - 95.6 %   MCV 104.5 (H) 80.0 - 100.0 fL   MCH 32.3 26.0 - 34.0 pg   MCHC 30.9 30.0 - 36.0 g/dL   RDW 21.3 08.6 - 57.8 %   Platelets 172 150 - 400 K/uL   nRBC 0.0 0.0 - 0.2 %    Comment: Performed at [facility], [address], [place], [place] [address]  Differential     Status: Abnormal   Collection Time: 07/13/21  5:06 PM  Result Value  Ref Range   Neutrophils Relative % 60 %   Neutro Abs 9.9 (H) 1.7 - 7.7 K/uL   Band Neutrophils 3 %   Lymphocytes Relative 22 %   Lymphs Abs 3.5 0.7 - 4.0 K/uL   Monocytes Relative 4 %   Monocytes Absolute 0.6 0.1 - 1.0 K/uL   Eosinophils Relative 2 %   Eosinophils Absolute 0.3 0.0 - 0.5 K/uL   Basophils Relative 0 %   Basophils Absolute 0.0 0.0 - 0.1 K/uL   WBC Morphology      MODERATE LEFT SHIFT (>5% METAS AND MYELOS,OCC PRO NOTED)   Metamyelocytes Relative 7 %   Myelocytes 2 %   Abs Immature Granulocytes 1.40 (H) 0.00 - 0.07 K/uL   Reactive, Benign Lymphocytes PRESENT     Comment: Performed at [facility], [address], [place], [place] [address]  Comprehensive metabolic panel     Status: Abnormal   Collection Time: 07/13/21  5:06 PM  Result Value Ref Range   Sodium 145 135 - 145 mmol/L   Potassium 4.8 3.5 - 5.1 mmol/L    Comment: MODERATE HEMOLYSIS   Chloride 111 98 - 111 mmol/L   CO2 24 22 - 32 mmol/L   Glucose, Bld 71 70 - 99 mg/dL    Comment: Glucose reference range applies only to samples taken after fasting for at least 8 hours.   BUN 31 (H) 8 - 23 mg/dL   Creatinine, Ser 1.30 (H) 0.44 - 1.00 mg/dL   Calcium 8.9 8.9 -  86.5 mg/dL   Total Protein 7.1 6.5 - 8.1 g/dL   Albumin 2.6 (L) 3.5 - 5.0 g/dL   AST 36 15 - 41 U/L   ALT 11 0 - 44 U/L   Alkaline Phosphatase 57 38 - 126 U/L   Total Bilirubin 0.8 0.3 - 1.2 mg/dL   GFR, Estimated 41 (L) >60 mL/min    Comment: (NOTE) Calculated using the CKD-EPI Creatinine Equation (2021)    Anion gap 10 5 - 15    Comment: Performed at [facility], [address], [place], [place] [address]  Ammonia     Status: None   Collection Time: 07/13/21  5:07 PM  Result Value Ref Range   Ammonia 24 9 - 35 umol/L    Comment: Performed at [facility], [address], [place], [place] [address]  Resp Panel by RT-PCR (Flu A&B, Covid) Nasopharyngeal Swab     Status: None   Collection Time: 07/13/21  5:51 PM   Specimen: Nasopharyngeal Swab; Nasopharyngeal(NP) swabs in vial transport medium  Result Value Ref Range   SARS Coronavirus 2 by RT PCR NEGATIVE NEGATIVE    Comment: (NOTE) SARS-CoV-2 target nucleic acids are NOT DETECTED.  The SARS-CoV-2 RNA is generally detectable in upper respiratory specimens during the acute phase of infection. The lowest concentration of SARS-CoV-2 viral copies this assay can detect is 138 copies/mL. A negative result does not preclude SARS-Cov-2 infection and should not be used as the sole basis for treatment or other patient management decisions. A negative result may occur with  improper specimen collection/handling, submission of specimen other than nasopharyngeal swab, presence of viral mutation(s) within the areas targeted by this assay, and inadequate number of viral copies(<138 copies/mL). A negative result must be combined with clinical observations, patient history, and epidemiological information. The expected result is Negative.  Fact Sheet for Patients:  BloggerCourse.com  Fact Sheet for Healthcare Providers:  SeriousBroker.it  This test is  no t yet approved or cleared by the [place] and  has been authorized for detection and/or diagnosis of SARS-CoV-2 by FDA under an Emergency Use Authorization (EUA). This EUA will remain  in effect (meaning this test can be used) for the duration of the COVID-19 declaration under Section 564(b)(1) of the Act, 21 U.S.C.section 360bbb-3(b)(1), unless the authorization is terminated  or revoked sooner.       Influenza A by PCR NEGATIVE NEGATIVE   Influenza B by PCR NEGATIVE NEGATIVE    Comment: (NOTE) The Xpert Xpress SARS-CoV-2/FLU/RSV plus assay is intended as an aid in the diagnosis of influenza from Nasopharyngeal swab specimens and should not be used as a sole basis for treatment. Nasal washings and aspirates are unacceptable for Xpert Xpress SARS-CoV-2/FLU/RSV testing.  Fact Sheet for Patients: BloggerCourse.com  Fact Sheet for Healthcare Providers: SeriousBroker.it  This test is not yet approved or cleared by the [place] FDA and has been authorized for detection and/or diagnosis of SARS-CoV-2 by FDA under an Emergency Use Authorization (EUA). This EUA will remain in effect (meaning this test can be used) for the duration of the COVID-19 declaration under Section 564(b)(1) of the Act, 21 U.S.C. section 360bbb-3(b)(1), unless the authorization is terminated or revoked.  Performed at [facility], [address], [place], [place] [address]   Valproic acid level     Status: Abnormal   Collection Time: 07/13/21  5:51 PM  Result Value Ref Range   Valproic Acid Lvl 38 (L) 50.0 - 100.0 ug/mL    Comment: Performed at [facility], [address], [place], [place] [address]  Blood gas, venous (at [facility] and [facility], not at [facility])     Status: Abnormal   Collection Time: 07/13/21  5:52 PM  Result Value Ref Range   pH, Ven 7.338 7.250 - 7.430   pCO2, Ven 40.8 (L) 44.0 - 60.0 mmHg   pO2,  Ven 161.0 (H) 32.0 - 45.0 mmHg   Bicarbonate 21.3 20.0 - 28.0 mmol/L   Acid-base deficit 3.6 (H) 0.0 - 2.0 mmol/L   O2 Saturation 99.1 %   Patient temperature 98.6     Comment: Performed at [facility], [address] [place], [place] [address]  Urine rapid drug screen (hosp performed)not at [facility]     Status: None   Collection Time: 07/13/21 10:00 PM  Result Value Ref Range   Opiates NONE DETECTED NONE DETECTED   Cocaine NONE DETECTED NONE DETECTED   Benzodiazepines NONE DETECTED NONE DETECTED   Amphetamines NONE DETECTED NONE DETECTED   Tetrahydrocannabinol NONE DETECTED NONE DETECTED   Barbiturates NONE DETECTED NONE DETECTED    Comment: (NOTE) DRUG SCREEN FOR MEDICAL PURPOSES ONLY.  IF CONFIRMATION IS NEEDED FOR ANY PURPOSE, NOTIFY LAB WITHIN 5 DAYS.  LOWEST DETECTABLE LIMITS FOR URINE DRUG SCREEN Drug Class                     Cutoff (ng/mL) Amphetamine and metabolites    1000 Barbiturate and metabolites    200 Benzodiazepine                 200 Tricyclics and metabolites     300 Opiates and metabolites        300 Cocaine and metabolites        300 THC  50 Performed at [facility], [address] [place], [place] [address]   Urinalysis, Routine w reflex microscopic     Status: Abnormal   Collection Time: 07/13/21 10:00 PM  Result Value Ref Range   Color, Urine YELLOW YELLOW   APPearance CLEAR CLEAR   Specific Gravity, Urine 1.020 1.005 - 1.030   pH 6.0 5.0 - 8.0   Glucose, UA NEGATIVE NEGATIVE mg/dL   Hgb urine dipstick NEGATIVE NEGATIVE   Bilirubin Urine NEGATIVE NEGATIVE   Ketones, ur NEGATIVE NEGATIVE mg/dL   Protein, ur NEGATIVE NEGATIVE mg/dL   Nitrite NEGATIVE NEGATIVE   Leukocytes,Ua MODERATE (A) NEGATIVE    Comment: Performed at [facility], [address] [place], [place] [address]  Urinalysis, Microscopic (reflex)     Status: Abnormal   Collection Time: 07/13/21  10:00 PM  Result Value Ref Range   RBC / HPF 0-5 0 - 5 RBC/hpf   WBC, UA 21-50 0 - 5 WBC/hpf   Bacteria, UA RARE (A) NONE SEEN   Squamous Epithelial / LPF 0-5 0 - 5   Hyaline Casts, UA PRESENT     Comment: Performed at [facility], [address] [place], [place] [address]    Chemistries  Recent Labs  Lab 07/13/21 1706  NA 145  K 4.8  CL 111  CO2 24  GLUCOSE 71  BUN 31*  CREATININE 1.28*  CALCIUM 8.9  AST 36  ALT 11  ALKPHOS 57  BILITOT 0.8   ------------------------------------------------------------------------------------------------------------------  ------------------------------------------------------------------------------------------------------------------ GFR: Estimated Creatinine Clearance: 28.4 mL/min (A) (by C-G formula based on SCr of 1.28 mg/dL (H)). Liver Function Tests: Recent Labs  Lab 07/13/21 1706  AST 36  ALT 11  ALKPHOS 57  BILITOT 0.8  PROT 7.1  ALBUMIN 2.6*   No results for input(s): LIPASE, AMYLASE in the last 168 hours. Recent Labs  Lab 07/13/21 1707  AMMONIA 24   Coagulation Profile: Recent Labs  Lab 07/13/21 1706  INR 1.2   Cardiac Enzymes: No results for input(s): CKTOTAL, CKMB, CKMBINDEX, TROPONINI in the last 168 hours. BNP (last 3 results) No results for input(s): PROBNP in the last 8760 hours. HbA1C: No results for input(s): HGBA1C in the last 72 hours. CBG: No results for input(s): GLUCAP in the last 168 hours. Lipid Profile: No results for input(s): CHOL, HDL, LDLCALC, TRIG, CHOLHDL, LDLDIRECT in the last 72 hours. Thyroid Function Tests: No results for input(s): TSH, T4TOTAL, FREET4, T3FREE, THYROIDAB in the last 72 hours. Anemia Panel: No results for input(s): VITAMINB12, FOLATE, FERRITIN, TIBC, IRON, RETICCTPCT in the last 72 hours.  --------------------------------------------------------------------------------------------------------------- Urine analysis:    Component Value  Date/Time   COLORURINE YELLOW 07/13/2021 2200   APPEARANCEUR CLEAR 07/13/2021 2200   LABSPEC 1.020 07/13/2021 2200   LABSPEC 1.020 04/23/2006 1313   PHURINE 6.0 07/13/2021 2200   GLUCOSEU NEGATIVE 07/13/2021 2200   GLUCOSEU NEGATIVE 02/28/2016 1559   HGBUR NEGATIVE 07/13/2021 2200   BILIRUBINUR NEGATIVE 07/13/2021 2200   BILIRUBINUR Negative 04/23/2006 1313   KETONESUR NEGATIVE 07/13/2021 2200   PROTEINUR NEGATIVE 07/13/2021 2200   UROBILINOGEN 0.2 02/28/2016 1559   NITRITE NEGATIVE 07/13/2021 2200   LEUKOCYTESUR MODERATE (A) 07/13/2021 2200   LEUKOCYTESUR Trace 04/23/2006 1313      Imaging Results:    CT HEAD WO CONTRAST  Result Date: 07/13/2021 CLINICAL DATA:  Focal neuro deficit, > 6 hrs, stroke suspected EXAM: CT HEAD WITHOUT CONTRAST TECHNIQUE: Contiguous axial images were obtained from the base of the  skull through the vertex without intravenous contrast. COMPARISON:  06/29/2021 FINDINGS: Brain: No evidence of acute infarction, hemorrhage, hydrocephalus, extra-axial collection or mass lesion/mass effect. Mild low-density changes within the periventricular and subcortical white matter compatible with chronic microvascular ischemic change. Mild diffuse cerebral volume loss. Vascular: Atherosclerotic calcifications involving the large vessels of the skull base. No unexpected hyperdense vessel. Skull: Normal. Negative for fracture or focal lesion. Sinuses/Orbits: No acute finding. Other: None. IMPRESSION: 1. No acute intracranial findings. 2. Chronic microvascular ischemic change and cerebral volume loss. Electronically Signed   By: Duanne Guess D.O.   On: 07/13/2021 18:42   DG Chest Portable 1 View  Result Date: 07/13/2021 CLINICAL DATA:  Altered mental status. EXAM: PORTABLE CHEST 1 VIEW COMPARISON:  June 29, 2021 FINDINGS: Low lung volumes are noted. Mild to moderate severity areas of atelectasis and/or infiltrate are seen within the bilateral lung bases, right greater than left.  There is no evidence of a pleural effusion or pneumothorax. The heart size and mediastinal contours are within normal limits. Radiopaque surgical clips are seen within the right upper quadrant. The visualized skeletal structures are unremarkable. IMPRESSION: Low lung volumes with mild to moderate severity bibasilar atelectasis and/or infiltrate, right greater than left. Electronically Signed   By: Aram Candela M.D.   On: 07/13/2021 20:47      Assessment & Plan:    Active Problems:   Hyperlipidemia   Essential hypertension   Acute kidney injury (HCC)   Acute encephalopathy   History of seizure   Acute respiratory failure with hypoxia (HCC)   Community acquired pneumonia   Acute respiratory failure with hypoxia EMS reports oxygen saturations in the 60s No oxygen supplementation at baseline Currently maintaining oxygen saturations with 3 L nasal cannula Infiltrate on chest x-ray-most likely cause EKG does have a new right bundle branch block, will check troponin Continue Rocephin and Zithromax for CAP ABG shows normal pH, PCO2 of 40, PO2 of 161 Wean off oxygen as tolerated Pneumonia Pneumonia order set utilized Culture expectorated sputum Urine antigens Continue Rocephin and Zithromax Continue to monitor Acute metabolic encephalopathy Likely multifactorial with hypoxia and infection contributing Continue treating as above CT head with no acute changes Continue to monitor History of seizure Subtherapeutic Depakote level Depakote recently increased to 750 mg twice daily Resume Depakote when patient is able to tolerate p.o. No seizure activity witnessed in the ER or reported from the facility Hyperlipidemia Continue statin when patient is able to tolerate p.o. Essential hypertension Blood pressure at admission 152/67 Resume amlodipine and Coreg when patient is able to tolerate p.o.  AKI Cr increase from 0.8 -> 1.28 Most likely 2/2 poor PO intake.  Avoid nephrotoxic  agents when possible Continue IV fluid hydration   DVT Prophylaxis-Heparin- SCDs   AM Labs Ordered, also please review Full Orders  Family Communication: Admission, patients condition and plan of care including tests being ordered have been discussed with the patient and son who indicate understanding and agree with the plan and Code Status.  Of note son at bedside has poor health literacy and reports that his sister does more patient's history.  Code Status:  full  Admission status: Observation Time spent in minutes : 65   Mayleen Borrero B Zierle-Ghosh DO

## 2021-07-14 NOTE — Progress Notes (Signed)
   07/14/21 0114  Assess: MEWS Score  Temp 99.2 F (37.3 C)  BP 129/80  Pulse Rate (!) 125  Resp 20  O2 Device Nasal Cannula  Patient Activity (if Appropriate) In bed  O2 Flow Rate (L/min) 3 L/min  Assess: MEWS Score  MEWS Temp 0  MEWS Systolic 0  MEWS Pulse 2  MEWS RR 0  MEWS LOC 0  MEWS Score 2  MEWS Score Color Yellow  Assess: if the MEWS score is Yellow or Red  Were vital signs taken at a resting state? Yes  Focused Assessment No change from prior assessment  Does the patient meet 2 or more of the SIRS criteria? No  Does the patient have a confirmed or suspected source of infection? Yes  MEWS guidelines implemented *See Row Information* Yes  Treat  MEWS Interventions Escalated (See documentation below)  Pain Scale PAINAD  Pain Location Generalized  Breathing 0  Negative Vocalization 1  Facial Expression 2  Body Language 1  Consolability 1  PAINAD Score 5  Take Vital Signs  Increase Vital Sign Frequency  Yellow: Q 2hr X 2 then Q 4hr X 2, if remains yellow, continue Q 4hrs  Notify: Charge Nurse/RN  Name of Charge Nurse/RN Notified Leonard,RN  Date Charge Nurse/RN Notified 07/14/21  Time Charge Nurse/RN Notified 0120  Assess: SIRS CRITERIA  SIRS Temperature  0  SIRS Pulse 1  SIRS Respirations  0  SIRS WBC 0  SIRS Score Sum  1  Admission vitals, YELLOW MEWS initiated, TRH notified, Phlebotomy unable to draw labs

## 2021-07-14 NOTE — Progress Notes (Signed)
PROGRESS NOTE  [name] DXI:[id number] DOB: Jun 13, 1933   PCP: Tresa Garter, MD  Patient is from: Nursing facility.  Nonambulatory at baseline.  DOA: 07/13/2021 LOS: 0  Chief complaints:  Chief Complaint  Patient presents with   Altered Mental Status     Brief Narrative / Interim history: 87 year old F with PMH of cognitive impairment, CAD, HTN, seizure, anxiety, B12 deficiency, and thoracic aortic aneurysm brought to ED from nursing facility with altered mental status, shortness of breath and hypoxia, and admitted for acute respiratory failure with hypoxia due to community-acquired pneumonia, acute metabolic encephalopathy and AKI.  CXR with bibasilar opacities.  Started on CAP coverage and IV fluid.  Subjective: Seen and examined earlier this morning.  No major events overnight of this morning.  Patient is sleepy but wakes to voice.  She is only oriented to self.  Barely follows command.  Does not appear to be in distress.  Objective: Vitals:   07/14/21 0114 07/14/21 0319 07/14/21 0514 07/14/21 0906  BP: 129/80 (!) 137/47 133/86 134/64  Pulse: (!) 125 83 94 98  Resp: 20 20 20    Temp: 99.2 F (37.3 C) 98.3 F (36.8 C) 98.2 F (36.8 C) 98.7 F (37.1 C)  TempSrc: Oral Oral Oral Oral  SpO2:  96% (!) 81% 100%  Weight:      Height:       No intake or output data in the 24 hours ending 07/14/21 1305 Filed Weights   07/13/21 1706  Weight: 62.2 kg    Examination:  GENERAL: Frail looking elderly female. HEENT: MMM.  No dentition.  Vision and hearing grossly intact.  NECK: Supple.  No apparent JVD.  RESP: 100% on 1 L lying.  No IWOB.  Fair aeration bilaterally. CVS:  RRR. Heart sounds normal.  ABD/GI/GU: BS+. Abd soft, NTND.  MSK/EXT:  Moves extremities. No apparent deformity. No edema.  SKIN: no apparent skin lesion or wound NEURO: Sleepy but wakes to voice.  Oriented to self.  Barely follows command.  PERRL.  Patellar reflex symmetric.  No apparent focal  neuro deficit but limited exam due to mental status. PSYCH: Calm. Normal affect.   Procedures:  None  Microbiology summarized: COVID-19 and influenza PCR nonreactive. Sputum culture pending.  Assessment & Plan: Severe sepsis likely due to CAP-meets criteria with leukocytosis, tachycardia, fever and encephalopathy  Acute respiratory failure with hypoxia due to possible aspiration pneumonia-presented with AMS, shortness of breath, hypoxia to 60s.  CXR with bibasilar opacities.  Has leukocytosis with left shift but no fever.  Hypoxia improved. -Continue ceftriaxone and azithromycin. -Follow sputum culture, procalcitonin -Continue IV fluids.  Change NS to D5-1/2 NS for hydration. -Aspiration precautions -Wean oxygen as able  Acute metabolic encephalopathy-likely due to respiratory failure, pneumonia, dehydration and underlying cognitive impairment as well.  No focal neurodeficit but limited exam due to mental status.  CT head without acute finding.  Depakote level slightly subtherapeutic at 38.  UDS negative.  Ammonia within normal.  Oriented to self and follows some commands which seems to be the case on admission. -Check TSH, B12 and RPR for completeness -Treat treatable causes -Aspiration, fall and delirium precautions.  Dysphagia: -Dysphagia 1 diet per SLP. -Aspiration precautions.  AKI/azotemia: Likely due to dehydration from poor p.o. intake.  Resolved. Recent Labs    03/31/21 1006 05/08/21 1434 05/09/21 0457 05/10/21 0111 05/11/21 0112 06/29/21 1115 06/30/21 0434 07/02/21 0255 07/13/21 1706 07/14/21 0545  BUN 12 24* 24* 20 19 18 20 17  31* 31*  CREATININE 0.76 1.22* 0.96 0.98 0.71 0.70 1.06* 0.83 1.28* 0.84  -Continue IV fluid -Monitor  History of seizure: On Depakote at home.  Depakote slightly subtherapeutic at 38. -Continue home Depakote-changed to liquid form due to dysphagia.  Essential hypertension: Normotensive for most part. -Continue home  medications  Mild hyponatremia: Na 146. -Change NS to D5-1/2NS -Recheck in the morning  Macrocytic anemia: Drop in Hgb likely transition from hemoconcentration to hemodilution with IVF. Recent Labs    03/21/21 1800 03/31/21 1006 05/08/21 1434 05/09/21 0028 05/10/21 0111 05/11/21 0112 06/29/21 1115 06/30/21 0434 07/13/21 1706 07/14/21 0545  HGB 7.1* 8.2* 8.1* 7.6* 7.3* 7.3* 9.2* 8.1* 10.0* 7.7*  -Check anemia panel -Monitor H&H  Hypoglycemia: BMP glucose 64. -IV fluid as above  Hypomagnesemia: Mg 1.4. -IV magnesium sulfate 4 g x 1  Elevated troponin: 26.  Likely demand ischemia   Generalized weakness/ambulatory dysfunction-reportedly nonambulatory at baseline.  Goal of care counseling: Patient with some cognitive impairment, advanced age, debility.  Very frail.  Still full code.  Discussed risk and benefits of CPR and intubation with patient's daughter over the phone.  Agreed on DNR/DNI -CODE STATUS updated in the computer.  Body mass index is 22.82 kg/m.       DVT prophylaxis:  heparin injection 5,000 Units Start: 07/14/21 0600 SCDs Start: 07/14/21 0108  Code Status: DNR/DNI Family Communication: Updated patient's daughters over the phone and at bedside. Level of care: Telemetry Status is: Observation  The patient will require care spanning > 2 midnights and should be moved to inpatient because: Persistent severe electrolyte disturbances, Altered mental status, IV treatments appropriate due to intensity of illness or inability to take PO, and Inpatient level of care appropriate due to severity of illness  Dispo: The patient is from: ALF              Anticipated d/c is to:  To be determined              Patient currently is not medically stable to d/c.   Difficult to place patient No       Consultants:  None   Sch Meds:  Scheduled Meds:  amLODipine  5 mg Oral Daily   carvedilol  3.125 mg Oral BID WC   feeding supplement  237 mL Oral BID BM    ferrous sulfate  325 mg Oral QPC breakfast   heparin  5,000 Units Subcutaneous Q8H   pantoprazole  40 mg Oral Daily   rosuvastatin  10 mg Oral Daily   thiamine  100 mg Oral Daily   Valproate Sodium  750 mg Oral BID   Continuous Infusions:  azithromycin     cefTRIAXone (ROCEPHIN)  IV     dextrose 5 % and 0.45% NaCl 100 mL/hr at 07/14/21 1204   magnesium sulfate bolus IVPB 4 g (07/14/21 1228)   PRN Meds:.  Antimicrobials: Anti-infectives (From admission, onward)    Start     Dose/Rate Route Frequency Ordered Stop   07/14/21 2359  azithromycin (ZITHROMAX) 500 mg in sodium chloride 0.9 % 250 mL IVPB        500 mg 250 mL/hr over 60 Minutes Intravenous Every 24 hours 07/14/21 0108 07/19/21 2359   07/14/21 2200  cefTRIAXone (ROCEPHIN) 2 g in sodium chloride 0.9 % 100 mL IVPB        2 g 200 mL/hr over 30 Minutes Intravenous Every 24 hours 07/14/21 0108 07/19/21 2159   07/13/21 2130  cefTRIAXone (ROCEPHIN) 1 g in sodium chloride  0.9 % 100 mL IVPB        1 g 200 mL/hr over 30 Minutes Intravenous  Once 07/13/21 2119 07/13/21 2243   07/13/21 2130  azithromycin (ZITHROMAX) 500 mg in sodium chloride 0.9 % 250 mL IVPB        500 mg 250 mL/hr over 60 Minutes Intravenous  Once 07/13/21 2119 07/14/21 0115        I have personally reviewed the following labs and images: CBC: Recent Labs  Lab 07/13/21 1706 07/14/21 0545  WBC 15.7* 15.3*  NEUTROABS 9.9*  --   HGB 10.0* 7.7*  HCT 32.4* 24.3*  MCV 104.5* 103.8*  PLT 172 186   BMP &GFR Recent Labs  Lab 07/13/21 1706 07/14/21 0545  NA 145 146*  K 4.8 4.3  CL 111 114*  CO2 24 22  GLUCOSE 71 64*  BUN 31* 31*  CREATININE 1.28* 0.84  CALCIUM 8.9 8.6*  MG  --  1.4*   Estimated Creatinine Clearance: 43.3 mL/min (by C-G formula based on SCr of 0.84 mg/dL). Liver & Pancreas: Recent Labs  Lab 07/13/21 1706 07/14/21 0545  AST 36 26  ALT 11 11  ALKPHOS 57 54  BILITOT 0.8 0.5  PROT 7.1 6.4*  ALBUMIN 2.6* 2.4*   No results for  input(s): LIPASE, AMYLASE in the last 168 hours. Recent Labs  Lab 07/13/21 1707  AMMONIA 24   Diabetic: No results for input(s): HGBA1C in the last 72 hours. No results for input(s): GLUCAP in the last 168 hours. Cardiac Enzymes: No results for input(s): CKTOTAL, CKMB, CKMBINDEX, TROPONINI in the last 168 hours. No results for input(s): PROBNP in the last 8760 hours. Coagulation Profile: Recent Labs  Lab 07/13/21 1706  INR 1.2   Thyroid Function Tests: No results for input(s): TSH, T4TOTAL, FREET4, T3FREE, THYROIDAB in the last 72 hours. Lipid Profile: No results for input(s): CHOL, HDL, LDLCALC, TRIG, CHOLHDL, LDLDIRECT in the last 72 hours. Anemia Panel: No results for input(s): VITAMINB12, FOLATE, FERRITIN, TIBC, IRON, RETICCTPCT in the last 72 hours. Urine analysis:    Component Value Date/Time   COLORURINE YELLOW 07/13/2021 2200   APPEARANCEUR CLEAR 07/13/2021 2200   LABSPEC 1.020 07/13/2021 2200   LABSPEC 1.020 04/23/2006 1313   PHURINE 6.0 07/13/2021 2200   GLUCOSEU NEGATIVE 07/13/2021 2200   GLUCOSEU NEGATIVE 02/28/2016 1559   HGBUR NEGATIVE 07/13/2021 2200   BILIRUBINUR NEGATIVE 07/13/2021 2200   BILIRUBINUR Negative 04/23/2006 1313   KETONESUR NEGATIVE 07/13/2021 2200   PROTEINUR NEGATIVE 07/13/2021 2200   UROBILINOGEN 0.2 02/28/2016 1559   NITRITE NEGATIVE 07/13/2021 2200   LEUKOCYTESUR MODERATE (A) 07/13/2021 2200   LEUKOCYTESUR Trace 04/23/2006 1313   Sepsis Labs: Invalid input(s): PROCALCITONIN, LACTICIDVEN  Microbiology: Recent Results (from the past 240 hour(s))  Resp Panel by RT-PCR (Flu A&B, Covid) Nasopharyngeal Swab     Status: None   Collection Time: 07/13/21  5:51 PM   Specimen: Nasopharyngeal Swab; Nasopharyngeal(NP) swabs in vial transport medium  Result Value Ref Range Status   SARS Coronavirus 2 by RT PCR NEGATIVE NEGATIVE Final    Comment: (NOTE) SARS-CoV-2 target nucleic acids are NOT DETECTED.  The SARS-CoV-2 RNA is generally  detectable in upper respiratory specimens during the acute phase of infection. The lowest concentration of SARS-CoV-2 viral copies this assay can detect is 138 copies/mL. A negative result does not preclude SARS-Cov-2 infection and should not be used as the sole basis for treatment or other patient management decisions. A negative result may occur with  improper specimen collection/handling, submission of specimen other than nasopharyngeal swab, presence of viral mutation(s) within the areas targeted by this assay, and inadequate number of viral copies(<138 copies/mL). A negative result must be combined with clinical observations, patient history, and epidemiological information. The expected result is Negative.  Fact Sheet for Patients:  BloggerCourse.com  Fact Sheet for Healthcare Providers:  SeriousBroker.it  This test is no t yet approved or cleared by the [place] FDA and  has been authorized for detection and/or diagnosis of SARS-CoV-2 by FDA under an Emergency Use Authorization (EUA). This EUA will remain  in effect (meaning this test can be used) for the duration of the COVID-19 declaration under Section 564(b)(1) of the Act, 21 U.S.C.section 360bbb-3(b)(1), unless the authorization is terminated  or revoked sooner.       Influenza A by PCR NEGATIVE NEGATIVE Final   Influenza B by PCR NEGATIVE NEGATIVE Final    Comment: (NOTE) The Xpert Xpress SARS-CoV-2/FLU/RSV plus assay is intended as an aid in the diagnosis of influenza from Nasopharyngeal swab specimens and should not be used as a sole basis for treatment. Nasal washings and aspirates are unacceptable for Xpert Xpress SARS-CoV-2/FLU/RSV testing.  Fact Sheet for Patients: BloggerCourse.com  Fact Sheet for Healthcare Providers: SeriousBroker.it  This test is not yet approved or cleared by the [place] FDA  and has been authorized for detection and/or diagnosis of SARS-CoV-2 by FDA under an Emergency Use Authorization (EUA). This EUA will remain in effect (meaning this test can be used) for the duration of the COVID-19 declaration under Section 564(b)(1) of the Act, 21 U.S.C. section 360bbb-3(b)(1), unless the authorization is terminated or revoked.  Performed at [facility], [address] [place], [place] [address]     Radiology Studies: CT HEAD WO CONTRAST  Result Date: 07/13/2021 CLINICAL DATA:  Focal neuro deficit, > 6 hrs, stroke suspected EXAM: CT HEAD WITHOUT CONTRAST TECHNIQUE: Contiguous axial images were obtained from the base of the skull through the vertex without intravenous contrast. COMPARISON:  06/29/2021 FINDINGS: Brain: No evidence of acute infarction, hemorrhage, hydrocephalus, extra-axial collection or mass lesion/mass effect. Mild low-density changes within the periventricular and subcortical white matter compatible with chronic microvascular ischemic change. Mild diffuse cerebral volume loss. Vascular: Atherosclerotic calcifications involving the large vessels of the skull base. No unexpected hyperdense vessel. Skull: Normal. Negative for fracture or focal lesion. Sinuses/Orbits: No acute finding. Other: None. IMPRESSION: 1. No acute intracranial findings. 2. Chronic microvascular ischemic change and cerebral volume loss. Electronically Signed   By: Duanne Guess D.O.   On: 07/13/2021 18:42   DG Chest Portable 1 View  Result Date: 07/13/2021 CLINICAL DATA:  Altered mental status. EXAM: PORTABLE CHEST 1 VIEW COMPARISON:  June 29, 2021 FINDINGS: Low lung volumes are noted. Mild to moderate severity areas of atelectasis and/or infiltrate are seen within the bilateral lung bases, right greater than left. There is no evidence of a pleural effusion or pneumothorax. The heart size and mediastinal contours are within normal limits. Radiopaque surgical clips are  seen within the right upper quadrant. The visualized skeletal structures are unremarkable. IMPRESSION: Low lung volumes with mild to moderate severity bibasilar atelectasis and/or infiltrate, right greater than left. Electronically Signed   By: Aram Candela M.D.   On: 07/13/2021 20:47       Katarzyna Wolven T. Avarae Zwart [facility]  If 7PM-7AM, please contact night-coverage [email or web address] 07/14/2021, 1:05 PM

## 2021-07-14 NOTE — Plan of Care (Signed)
  Problem: Clinical Measurements: Goal: Respiratory complications will improve Outcome: Progressing   Problem: Pain Managment: Goal: General experience of comfort will improve Outcome: Progressing   Problem: Safety: Goal: Ability to remain free from injury will improve Outcome: Progressing   Problem: Skin Integrity: Goal: Risk for impaired skin integrity will decrease Outcome: Progressing   Problem: Respiratory: Goal: Ability to maintain adequate ventilation will improve Outcome: Progressing

## 2021-07-15 LAB — RENAL FUNCTION PANEL
Albumin: 1.9 g/dL — ABNORMAL LOW (ref 3.5–5.0)
Anion gap: 5 (ref 5–15)
BUN: 28 mg/dL — ABNORMAL HIGH (ref 8–23)
CO2: 20 mmol/L — ABNORMAL LOW (ref 22–32)
Calcium: 8.1 mg/dL — ABNORMAL LOW (ref 8.9–10.3)
Chloride: 114 mmol/L — ABNORMAL HIGH (ref 98–111)
Creatinine, Ser: 1.05 mg/dL — ABNORMAL HIGH (ref 0.44–1.00)
GFR, Estimated: 52 mL/min — ABNORMAL LOW (ref >60)
Glucose, Bld: 125 mg/dL — ABNORMAL HIGH (ref 70–99)
Phosphorus: 2.3 mg/dL — ABNORMAL LOW (ref 2.5–4.6)
Potassium: 3.8 mmol/L (ref 3.5–5.1)
Sodium: 139 mmol/L (ref 135–145)

## 2021-07-15 LAB — IRON AND TIBC
Iron: 13 ug/dL — ABNORMAL LOW (ref 28–170)
Saturation Ratios: 12 % (ref 10.4–31.8)
TIBC: 111 ug/dL — ABNORMAL LOW (ref 250–450)
UIBC: 98 ug/dL

## 2021-07-15 LAB — MAGNESIUM: Magnesium: 2.3 mg/dL (ref 1.7–2.4)

## 2021-07-15 LAB — CBC WITH DIFFERENTIAL/PLATELET
Abs Immature Granulocytes: 0 10*3/uL (ref 0.00–0.07)
Band Neutrophils: 7 %
Basophils Absolute: 0 10*3/uL (ref 0.0–0.1)
Basophils Relative: 0 %
Eosinophils Absolute: 0 10*3/uL (ref 0.0–0.5)
Eosinophils Relative: 0 %
HCT: 23.2 % — ABNORMAL LOW (ref 36.0–46.0)
Hemoglobin: 7.4 g/dL — ABNORMAL LOW (ref 12.0–15.0)
Lymphocytes Relative: 18 %
Lymphs Abs: 2.2 10*3/uL (ref 0.7–4.0)
MCH: 32.7 pg (ref 26.0–34.0)
MCHC: 31.9 g/dL (ref 30.0–36.0)
MCV: 102.7 fL — ABNORMAL HIGH (ref 80.0–100.0)
Monocytes Absolute: 0.6 10*3/uL (ref 0.1–1.0)
Monocytes Relative: 5 %
Neutro Abs: 9.2 10*3/uL — ABNORMAL HIGH (ref 1.7–7.7)
Neutrophils Relative %: 70 %
Platelets: 165 10*3/uL (ref 150–400)
RBC: 2.26 MIL/uL — ABNORMAL LOW (ref 3.87–5.11)
RDW: 14.6 % (ref 11.5–15.5)
WBC: 12 10*3/uL — ABNORMAL HIGH (ref 4.0–10.5)
nRBC: 0 % (ref 0.0–0.2)

## 2021-07-15 LAB — FERRITIN: Ferritin: 231 ng/mL (ref 11–307)

## 2021-07-15 LAB — CK: Total CK: 44 U/L (ref 38–234)

## 2021-07-15 LAB — TSH: TSH: 1.116 u[IU]/mL (ref 0.350–4.500)

## 2021-07-15 LAB — RETICULOCYTES
Immature Retic Fract: 6.7 % (ref 2.3–15.9)
RBC.: 2.26 MIL/uL — ABNORMAL LOW (ref 3.87–5.11)
Retic Count, Absolute: 16.3 10*3/uL — ABNORMAL LOW (ref 19.0–186.0)
Retic Ct Pct: 0.7 % (ref 0.4–3.1)

## 2021-07-15 LAB — FOLATE: Folate: 4.6 ng/mL — ABNORMAL LOW (ref >5.9)

## 2021-07-15 LAB — VITAMIN B12: Vitamin B-12: 1415 pg/mL — ABNORMAL HIGH (ref 180–914)

## 2021-07-15 LAB — PROCALCITONIN: Procalcitonin: 2.01 ng/mL

## 2021-07-15 NOTE — Progress Notes (Signed)
PROGRESS NOTE  [name] ZOX:[id number] DOB: [birth year]   PCP: Tresa Garter, MD  Patient is from: Nursing facility.  Nonambulatory at baseline.  DOA: 07/13/2021 LOS: 1  Chief complaints:  Chief Complaint  Patient presents with   Altered Mental Status     Brief Narrative / Interim history: 87 year old F with PMH of cognitive impairment, CAD, HTN, seizure, anxiety, B12 deficiency, and thoracic aortic aneurysm brought to ED from nursing facility with altered mental status, shortness of breath and hypoxia, and admitted for acute respiratory failure with hypoxia due to community-acquired pneumonia, acute metabolic encephalopathy and AKI.  CXR with bibasilar opacities.  Started on CAP coverage and IV fluid.  Respiratory failure seems to have resolved but encephalopathy persisted  Subjective: Seen and examined earlier this morning.  No major events overnight of this morning.  She is awake but remains confused.  Barely follows command.  Does not appear to be in distress.  No apparent focal neurodeficit.  Objective: Vitals:   07/14/21 1715 07/14/21 1943 07/15/21 0617 07/15/21 1250  BP: (!) 107/50 118/60 (!) 128/53 (!) 101/44  Pulse: 91 94 93 81  Resp:  Temp: 98.2 F (36.8 C) 98.7 F (37.1 C) 99.2 F (37.3 C) 98.7 F (37.1 C)  TempSrc: Axillary Oral Oral Oral  SpO2: 97% 97% 98% 100%  Weight:      Height:        Intake/Output Summary (Last 24 hours) at 07/15/2021 1320 Last data filed at 07/15/2021 0900 Gross per 24 hour  Intake 2757.14 ml  Output 350 ml  Net 2407.14 ml   Filed Weights   07/13/21 1706  Weight: 62.2 kg    Examination:  GENERAL: Frail looking elderly female.  No apparent distress. HEENT: MMM.  Vision and hearing grossly intact.  NECK: Supple.  No apparent JVD.  RESP: 100% on RA.  No IWOB.  Fair aeration bilaterally. CVS:  RRR. Heart sounds normal.  ABD/GI/GU: BS+. Abd soft, NTND.  MSK/EXT:  Moves extremities. No apparent deformity. No  edema.  SKIN: Chronic sacral decubitus NEURO: Awake but confused.  Barely follows command.  PERRL.  Patellar reflex symmetric.  No apparent focal neurodeficit but limited exam due to mental status. PSYCH: No distress or agitation.  Procedures:  None  Microbiology summarized: COVID-19 and influenza PCR nonreactive. Blood cultures NGTD. Sputum culture pending.  Assessment & Plan: Severe sepsis likely due to possible aspiration pneumonia meets criteria with leukocytosis, tachycardia, fever and encephalopathy.  Procalcitonin elevated to 2.0. Acute respiratory failure with hypoxia due to possible aspiration pneumonia-presented with AMS, SOB, hypoxia to 60s.  CXR with bibasilar opacities.  Has leukocytosis with left shift but no fever.  Respiratory failure resolved. -Continue ceftriaxone and azithromycin. -Follow sputum culture, procalcitonin -Continue D5-1/2 NS for hydration. -Aspiration precautions  Acute metabolic encephalopathy-likely due to respiratory failure, pneumonia, dehydration and possible delirium in the setting of underlying cognitive impairment as well.  No focal neurodeficit but limited exam due to mental status.  UDS, ammonia, B12, TSH and CT head unrevealing.  Depakote level slightly subtherapeutic at 38.  -Treat treatable causes -Aspiration, fall and delirium precautions.  Dysphagia: -Dysphagia 1 diet per SLP. -Aspiration precautions.  AKI/azotemia: Likely due to dehydration from poor p.o. intake.  Resolved. Recent Labs    05/08/21 1434 05/09/21 0457 05/10/21 0111 05/11/21 0112 06/29/21 1115 06/30/21 0434 07/02/21 0255 07/13/21 1706 07/14/21 0545 07/15/21 0606  BUN 24* 24* 31* 31* 28*  CREATININE 1.22* 0.96  0.98 0.71 0.70 1.06* 0.83 1.28* 0.84 1.05*  -Continue IV fluid -Monitor  History of seizure: On Depakote at home.  Depakote slightly subtherapeutic at 38. -Continue home Depakote-changed to liquid form due to dysphagia.  Essential  hypertension: Normotensive for most part. -Continue home medications  Mild hyponatremia: Resolved.  Macrocytic anemia: Drop in Hgb likely transition from hemoconcentration to hemodilution with IVF.  Anemia panel consistent with anemia of chronic disease.  Folic acid is slightly low. Recent Labs    03/31/21 1006 05/08/21 1434 05/09/21 0028 05/10/21 0111 05/11/21 0112 06/29/21 1115 06/30/21 0434 07/13/21 1706 07/14/21 0545 07/15/21 0858  HGB 8.2* 8.1* 7.6* 7.3* 7.3* 9.2* 8.1* 10.0* 7.7* 7.4*  -Replace folic acid -Monitor H&H  Hypoglycemia: Resolved. -IV fluid as above  Hypomagnesemia: Resolved.  Elevated troponin: 26.  Likely demand ischemia  Generalized weakness/ambulatory dysfunction-reportedly nonambulatory at baseline.  Goal of care counseling: Appropriately DNR/DNI.  See goal of care discussion on 9/2.  Increased nutritional needs Body mass index is 22.82 kg/m.  -Consult dietitian     DVT prophylaxis:  heparin injection 5,000 Units Start: 07/14/21 0600 SCDs Start: 07/14/21 0108  Code Status: DNR/DNI Family Communication: Updated patient's daughter at bedside. Level of care: Telemetry  Status is: Inpatient  Remains inpatient appropriate because:Altered mental status, IV treatments appropriate due to intensity of illness or inability to take PO, and Inpatient level of care appropriate due to severity of illness  Dispo: The patient is from: SNF              Anticipated d/c is to: SNF              Patient currently is not medically stable to d/c.   Difficult to place patient No            Consultants:  None   Sch Meds:  Scheduled Meds:  amLODipine  5 mg Oral Daily   carvedilol  3.125 mg Oral BID WC   feeding supplement  237 mL Oral BID BM   ferrous sulfate  325 mg Oral QPC breakfast   heparin  5,000 Units Subcutaneous Q8H   pantoprazole  40 mg Oral Daily   rosuvastatin  10 mg Oral Daily   thiamine  100 mg Oral Daily   valproic acid  750  mg Oral BID   Continuous Infusions:  azithromycin 500 mg (07/15/21 0118)   cefTRIAXone (ROCEPHIN)  IV Stopped (07/14/21 2245)   dextrose 5 % and 0.45% NaCl 100 mL/hr at 07/15/21 0624   PRN Meds:.  Antimicrobials: Anti-infectives (From admission, onward)    Start     Dose/Rate Route Frequency Ordered Stop   07/14/21 2359  azithromycin (ZITHROMAX) 500 mg in sodium chloride 0.9 % 250 mL IVPB        500 mg 250 mL/hr over 60 Minutes Intravenous Every 24 hours 07/14/21 0108 07/19/21 2359   07/14/21 2200  cefTRIAXone (ROCEPHIN) 2 g in sodium chloride 0.9 % 100 mL IVPB        2 g 200 mL/hr over 30 Minutes Intravenous Every 24 hours 07/14/21 0108 07/19/21 2159   07/13/21 2130  cefTRIAXone (ROCEPHIN) 1 g in sodium chloride 0.9 % 100 mL IVPB        1 g 200 mL/hr over 30 Minutes Intravenous  Once 07/13/21 2119 07/13/21 2243   07/13/21 2130  azithromycin (ZITHROMAX) 500 mg in sodium chloride 0.9 % 250 mL IVPB        500 mg 250 mL/hr over 60 Minutes Intravenous  Once 07/13/21 2119 07/14/21 0115        I have personally reviewed the following labs and images: CBC: Recent Labs  Lab 07/13/21 1706 07/14/21 0545 07/15/21 0858  WBC 15.7* 15.3* 12.0*  NEUTROABS 9.9*  --  9.2*  HGB 10.0* 7.7* 7.4*  HCT 32.4* 24.3* 23.2*  MCV 104.5* 103.8* 102.7*  PLT 172 186 165   BMP &GFR Recent Labs  Lab 07/13/21 1706 07/14/21 0545 07/15/21 0606  NA 145 146* 139  K 4.8 4.3 3.8  CL 111 114* 114*  CO2 24 22 20*  GLUCOSE 71 64* 125*  BUN 31* 31* 28*  CREATININE 1.28* 0.84 1.05*  CALCIUM 8.9 8.6* 8.1*  MG  --  1.4* 2.3  PHOS  --   --  2.3*   Estimated Creatinine Clearance: 34.6 mL/min (A) (by C-G formula based on SCr of 1.05 mg/dL (H)). Liver & Pancreas: Recent Labs  Lab 07/13/21 1706 07/14/21 0545 07/15/21 0606  AST 36 26  --   ALT 11 11  --   ALKPHOS 57 54  --   BILITOT 0.8 0.5  --   PROT 7.1 6.4*  --   ALBUMIN 2.6* 2.4* 1.9*   No results for input(s): LIPASE, AMYLASE in the last  168 hours. Recent Labs  Lab 07/13/21 1707  AMMONIA 24   Diabetic: No results for input(s): HGBA1C in the last 72 hours. No results for input(s): GLUCAP in the last 168 hours. Cardiac Enzymes: Recent Labs  Lab 07/15/21 0606  CKTOTAL 44   No results for input(s): PROBNP in the last 8760 hours. Coagulation Profile: Recent Labs  Lab 07/13/21 1706  INR 1.2   Thyroid Function Tests: Recent Labs    07/15/21 0606  TSH 1.116   Lipid Profile: No results for input(s): CHOL, HDL, LDLCALC, TRIG, CHOLHDL, LDLDIRECT in the last 72 hours. Anemia Panel: Recent Labs    07/15/21 0606 07/15/21 0858  VITAMINB12 1,415*  --   FOLATE 4.6*  --   FERRITIN 231  --   TIBC 111*  --   IRON 13*  --   RETICCTPCT  --  0.7   Urine analysis:    Component Value Date/Time   COLORURINE YELLOW 07/13/2021 2200   APPEARANCEUR CLEAR 07/13/2021 2200   LABSPEC 1.020 07/13/2021 2200   LABSPEC 1.020 04/23/2006 1313   PHURINE 6.0 07/13/2021 2200   GLUCOSEU NEGATIVE 07/13/2021 2200   GLUCOSEU NEGATIVE 02/28/2016 1559   HGBUR NEGATIVE 07/13/2021 2200   BILIRUBINUR NEGATIVE 07/13/2021 2200   BILIRUBINUR Negative 04/23/2006 1313   KETONESUR NEGATIVE 07/13/2021 2200   PROTEINUR NEGATIVE 07/13/2021 2200   UROBILINOGEN 0.2 02/28/2016 1559   NITRITE NEGATIVE 07/13/2021 2200   LEUKOCYTESUR MODERATE (A) 07/13/2021 2200   LEUKOCYTESUR Trace 04/23/2006 1313   Sepsis Labs: Invalid input(s): PROCALCITONIN, LACTICIDVEN  Microbiology: Recent Results (from the past 240 hour(s))  Resp Panel by RT-PCR (Flu A&B, Covid) Nasopharyngeal Swab     Status: None   Collection Time: 07/13/21  5:51 PM   Specimen: Nasopharyngeal Swab; Nasopharyngeal(NP) swabs in vial transport medium  Result Value Ref Range Status   SARS Coronavirus 2 by RT PCR NEGATIVE NEGATIVE Final    Comment: (NOTE) SARS-CoV-2 target nucleic acids are NOT DETECTED.  The SARS-CoV-2 RNA is generally detectable in upper respiratory specimens during  the acute phase of infection. The lowest concentration of SARS-CoV-2 viral copies this assay can detect is 138 copies/mL. A negative result does not preclude SARS-Cov-2 infection and should not be used as  the sole basis for treatment or other patient management decisions. A negative result may occur with  improper specimen collection/handling, submission of specimen other than nasopharyngeal swab, presence of viral mutation(s) within the areas targeted by this assay, and inadequate number of viral copies(<138 copies/mL). A negative result must be combined with clinical observations, patient history, and epidemiological information. The expected result is Negative.  Fact Sheet for Patients:  BloggerCourse.com  Fact Sheet for Healthcare Providers:  SeriousBroker.it  This test is no t yet approved or cleared by the [place] FDA and  has been authorized for detection and/or diagnosis of SARS-CoV-2 by FDA under an Emergency Use Authorization (EUA). This EUA will remain  in effect (meaning this test can be used) for the duration of the COVID-19 declaration under Section 564(b)(1) of the Act, 21 U.S.C.section 360bbb-3(b)(1), unless the authorization is terminated  or revoked sooner.       Influenza A by PCR NEGATIVE NEGATIVE Final   Influenza B by PCR NEGATIVE NEGATIVE Final    Comment: (NOTE) The Xpert Xpress SARS-CoV-2/FLU/RSV plus assay is intended as an aid in the diagnosis of influenza from Nasopharyngeal swab specimens and should not be used as a sole basis for treatment. Nasal washings and aspirates are unacceptable for Xpert Xpress SARS-CoV-2/FLU/RSV testing.  Fact Sheet for Patients: BloggerCourse.com  Fact Sheet for Healthcare Providers: SeriousBroker.it  This test is not yet approved or cleared by the [place] FDA and has been authorized for detection and/or  diagnosis of SARS-CoV-2 by FDA under an Emergency Use Authorization (EUA). This EUA will remain in effect (meaning this test can be used) for the duration of the COVID-19 declaration under Section 564(b)(1) of the Act, 21 U.S.C. section 360bbb-3(b)(1), unless the authorization is terminated or revoked.  Performed at [facility], [address], [place], [place] [address]   Culture, blood (routine x 2)     Status: None (Preliminary result)   Collection Time: 07/14/21  4:09 PM   Specimen: BLOOD LEFT FOREARM  Result Value Ref Range Status   Specimen Description   Final    BLOOD LEFT FOREARM Performed at [facility], [address], [place], [place] [address]    Special Requests   Final    BOTTLES DRAWN AEROBIC ONLY Blood Culture adequate volume Performed at [facility], [address], [place], [place] [address]    Culture   Final    NO GROWTH < 12 HOURS Performed at [facility], [address] [place], [place] [address]    Report Status PENDING  Incomplete  Culture, blood (routine x 2)     Status: None (Preliminary result)   Collection Time: 07/14/21  4:09 PM   Specimen: BLOOD LEFT HAND  Result Value Ref Range Status   Specimen Description   Final    BLOOD LEFT HAND Performed at [facility], [address] [place], [place] [address]    Special Requests   Final    BOTTLES DRAWN AEROBIC ONLY Blood Culture adequate volume Performed at [facility], [address], [place], [place] [address]    Culture   Final    NO GROWTH < 12 HOURS Performed at [facility], [address] [place], [place] [address]    Report Status PENDING  Incomplete    Radiology Studies: No results found.     Tahj Lindseth T. Kristyne Woodring [facility]  If 7PM-7AM, please contact night-coverage [email or web address] 07/15/2021, 1:20 PM

## 2021-07-15 NOTE — Evaluation (Signed)
Occupational Therapy Evaluation Patient Details Name: [name] MRN: [id number] DOB: 07-15-1933 Today's Date: 07/15/2021    History of Present Illness Patient is a 87 y.o. female admitted from SNF with AMS. Dx of AKI, PNA. Pt with medical history significant of hypertension, chronic kidney disease stage II, hyperlipidemia, prediabetes. Recent DC 02/19/80 for metabolic encephalopathy, UTi.   Clinical Impression   Patient is a long term care resident at local SNF. Patient and family plan for patient to d/c back to SNF to continue LTC at time of d/c. Patient is currently unable to follow commands for participation in grooming tasks with increased lethargicness. Patients daughter was educated on how patients fatigue and pain is impacting ability to engage in ADLs and therapy. Patients daughter verbalized understanding. Patient appears to be at baseline at this time. All needs can be met at SNF in next level of care. OT is signing off at this time.     Follow Up Recommendations  SNF;Supervision/Assistance - 24 hour    Equipment Recommendations  None recommended by OT    Recommendations for Other Services       Precautions / Restrictions Precautions Precautions: Fall Restrictions Weight Bearing Restrictions: No      Mobility Bed Mobility               General bed mobility comments: patient was TD for rolling in bed for repositioning to offload pressure with nursing staff. patient was noted to moan with all movements and for short time period after moving.    Transfers                      Balance                                           ADL either performed or assessed with clinical judgement   ADL Overall ADL's : At baseline                                       General ADL Comments: patient is TD for bathing,dressing and toileting tasks for over 6 months at this time.patient was unable to follow commands to wipe face with  wash cloth or bring wash cloth to face with either UE.  patient is a long term care resident at SNF per daughter report.patients daughter educated on how patient was at functional baseline with current state of lethargicness. patients daughter verbalized understanding.     Vision         Perception     Praxis      Pertinent Vitals/Pain Faces Pain Scale: Hurts even more Pain Location: with rolling in bed Pain Descriptors / Indicators: Moaning;Guarding Pain Intervention(s): Limited activity within patient's tolerance;Monitored during session;Repositioned     Hand Dominance Right   Extremity/Trunk Assessment Upper Extremity Assessment Upper Extremity Assessment: Difficult to assess due to impaired cognition   Lower Extremity Assessment Lower Extremity Assessment: Defer to PT evaluation       Communication Communication Communication: HOH   Cognition Arousal/Alertness: Lethargic Behavior During Therapy: Flat affect Overall Cognitive Status: Impaired/Different from baseline Area of Impairment: Orientation;Attention;Memory;Following commands;Safety/judgement;Awareness;Problem solving                 Orientation Level: Person;Place;Time;Situation;Disoriented to     Following Commands: Follows  one step commands inconsistently       General Comments: patient had limited ability to respond to questions asked during session. patients daugher was present with attempts to get patient to respond as well with continued inconsistent participation. patient would open eyes some during session but was not for longer than 30 seconds prior to increased lethargicness.   General Comments       Exercises     Shoulder Instructions      Home Living Family/patient expects to be discharged to:: Skilled nursing facility                                 Additional Comments: [name].      Prior Functioning/Environment Level of Independence: Needs assistance   Gait / Transfers Assistance Needed: Use a w/c primarily. Staff transfering her to w/c with lift equipment ADL's / Homemaking Assistance Needed: TD with ADLs; can feed herself but takes a long time.   Comments: Was using rollator for mobility prior to feb, OA in B knees has limited her tolerance of walking since February per daughter        OT Problem List: Decreased strength;Decreased range of motion;Decreased activity tolerance;Impaired balance (sitting and/or standing);Decreased safety awareness;Pain;Impaired UE functional use;Decreased cognition;Decreased coordination      OT Treatment/Interventions:      OT Goals(Current goals can be found in the care plan section) Acute Rehab OT Goals OT Goal Formulation: All assessment and education complete, DC therapy  OT Frequency:     Barriers to D/C:            Co-evaluation              AM-PAC OT "6 Clicks" Daily Activity     Outcome Measure Help from another person eating meals?: A Lot Help from another person taking care of personal grooming?: Total Help from another person toileting, which includes using toliet, bedpan, or urinal?: Total Help from another person bathing (including washing, rinsing, drying)?: Total Help from another person to put on and taking off regular upper body clothing?: Total Help from another person to put on and taking off regular lower body clothing?: Total 6 Click Score: 7   End of Session Nurse Communication: Other (comment) (nurse cleared patient to participate)  Activity Tolerance: Patient limited by lethargy;Patient limited by pain;Patient limited by fatigue Patient left: in bed;with call bell/phone within reach;with family/visitor present                   Time: 1330-1355 OT Time Calculation (min): 25 min Charges:  OT General Charges $OT Visit: 1 Visit OT Evaluation $OT Eval Low Complexity: 1 Low OT Treatments $Self Care/Home Management : 8-22 mins    OTR/L, MS Acute  Rehabilitation Department Office# [phone] Pager# [phone]    [place]  07/15/2021, 2:08 PM 

## 2021-07-16 LAB — BLOOD CULTURE ID PANEL (REFLEXED) - BCID2

## 2021-07-16 LAB — RENAL FUNCTION PANEL
Albumin: 1.9 g/dL — ABNORMAL LOW (ref 3.5–5.0)
Anion gap: 9 (ref 5–15)
BUN: 26 mg/dL — ABNORMAL HIGH (ref 8–23)
CO2: 16 mmol/L — ABNORMAL LOW (ref 22–32)
Calcium: 8.2 mg/dL — ABNORMAL LOW (ref 8.9–10.3)
Chloride: 119 mmol/L — ABNORMAL HIGH (ref 98–111)
Creatinine, Ser: 0.81 mg/dL (ref 0.44–1.00)
GFR, Estimated: >60 mL/min (ref >60)
Glucose, Bld: 120 mg/dL — ABNORMAL HIGH (ref 70–99)
Phosphorus: 2.6 mg/dL (ref 2.5–4.6)
Potassium: 3.9 mmol/L (ref 3.5–5.1)
Sodium: 144 mmol/L (ref 135–145)

## 2021-07-16 LAB — CBC
HCT: 23.8 % — ABNORMAL LOW (ref 36.0–46.0)
Hemoglobin: 7.4 g/dL — ABNORMAL LOW (ref 12.0–15.0)
MCH: 32 pg (ref 26.0–34.0)
MCHC: 31.1 g/dL (ref 30.0–36.0)
MCV: 103 fL — ABNORMAL HIGH (ref 80.0–100.0)
Platelets: 157 10*3/uL (ref 150–400)
RBC: 2.31 MIL/uL — ABNORMAL LOW (ref 3.87–5.11)
RDW: 14.7 % (ref 11.5–15.5)
WBC: 11.6 10*3/uL — ABNORMAL HIGH (ref 4.0–10.5)
nRBC: 0.2 % (ref 0.0–0.2)

## 2021-07-16 LAB — RPR: RPR Ser Ql: NONREACTIVE

## 2021-07-16 LAB — PROCALCITONIN: Procalcitonin: 0.94 ng/mL

## 2021-07-16 LAB — MAGNESIUM: Magnesium: 2.1 mg/dL (ref 1.7–2.4)

## 2021-07-16 LAB — PREPARE RBC (CROSSMATCH)

## 2021-07-16 MED ORDER — SODIUM CHLORIDE 0.9% IV SOLUTION: Freq: Once | INTRAVENOUS | Status: DC

## 2021-07-16 MED ORDER — FOLIC ACID 1 MG PO TABS
1.0000 mg | ORAL_TABLET | Freq: Every day | ORAL | Status: DC
  Administered 2021-07-16 – 2021-07-18 (×3): 1 mg via ORAL
  Filled 2021-07-16 (×3): qty 1

## 2021-07-16 MED ORDER — ADULT MULTIVITAMIN W/MINERALS CH
1.0000 | ORAL_TABLET | Freq: Every day | ORAL | Status: DC
  Administered 2021-07-16 – 2021-07-18 (×3): 1 via ORAL
  Filled 2021-07-16 (×3): qty 1

## 2021-07-16 MED ORDER — LIP MEDEX EX OINT
TOPICAL_OINTMENT | CUTANEOUS | Status: AC
  Filled 2021-07-16: qty 7

## 2021-07-16 NOTE — Progress Notes (Signed)
PROGRESS NOTE  [name]:[id number] DOB: 08/18/1933   PCP: Tresa Garter, MD  Patient is from: Nursing facility.  Nonambulatory at baseline.  DOA: 07/13/2021 LOS: 2  Chief complaints:  Chief Complaint  Patient presents with   Altered Mental Status     Brief Narrative / Interim history: 87 year old F with PMH of cognitive impairment, CAD, HTN, seizure, anxiety, B12 deficiency, and thoracic aortic aneurysm brought to ED from nursing facility with altered mental status, shortness of breath and hypoxia, and admitted for acute respiratory failure with hypoxia due to community-acquired pneumonia, acute metabolic encephalopathy and AKI.  CXR with bibasilar opacities.  Started on CAP coverage and IV fluid.  Respiratory failure seems to have resolved.  Some improvement in mental status as well.  Subjective: Seen and examined earlier this morning.  No major events overnight of this morning.  No complaints but not interactive.  She responds no to pain.  Does not appear to be in distress.  Follows some commands.  Objective: Vitals:   07/15/21 0617 07/15/21 1250 07/15/21 2051 07/16/21 0624  BP: (!) 128/53 (!) 101/44 (!) 127/53 (!) 134/55  Pulse: 93 81 80 75  Resp: Temp: 99.2 F (37.3 C) 98.7 F (37.1 C) 98.6 F (37 C) 98.6 F (37 C)  TempSrc: Oral Oral Oral Oral  SpO2: 98% 100% 100%   Weight:      Height:        Intake/Output Summary (Last 24 hours) at 07/16/2021 1115 Last data filed at 07/16/2021 0900 Gross per 24 hour  Intake 1489.13 ml  Output 300 ml  Net 1189.13 ml   Filed Weights   07/13/21 1706  Weight: 62.2 kg    Examination:  GENERAL: Frail looking elderly female.  No apparent distress. HEENT: MMM.  Vision and hearing grossly intact.  NECK: Supple.  No apparent JVD.  RESP: 100% on RA.  No IWOB.  Fair aeration bilaterally. CVS:  RRR. Heart sounds normal.  ABD/GI/GU: BS+. Abd soft, NTND.  MSK/EXT:  Moves extremities. No apparent deformity.  No edema.  SKIN: Chronic sacral decubitus NEURO: Awake but not quite alert.  Oriented x1.  Follows some commands.  PERRL.  Patellar reflex symmetric.  No apparent focal neurodeficit but limited exam due to mental status. PSYCH: Calm.  No distress or agitation.  Procedures:  None  Microbiology summarized: COVID-19 and influenza PCR nonreactive. Blood cultures NGTD. Sputum culture pending.  Assessment & Plan: Severe sepsis due to possible aspiration pneumonia meets criteria with leukocytosis, tachycardia, fever and encephalopathy.  CXR with bibasilar opacities.  Has leukocytosis with left shift.  Pro-Cal elevated but improved.  Sepsis physiology resolving.  Blood cultures with staph (not aureus) in aerobic bottles which is likely contaminant. --Continue ceftriaxone and azithromycin. -Continue D5-1/2 NS for hydration due to poor p.o. intake. -Aspiration precautions  Acute respiratory failure with hypoxia due to possible aspiration pneumonia-resolved. -Aspiration precautions  Acute metabolic encephalopathy-likely due to respiratory failure, pneumonia, dehydration and possible delirium in the setting of underlying cognitive impairment as well.  No focal neurodeficit but limited exam due to mental status.  UDS, ammonia, B12, TSH and CT head unrevealing.  Depakote level slightly subtherapeutic at 38.  -Treat treatable causes -Aspiration, fall and delirium precautions.  Dysphagia: -Continue dysphagia 1 diet per SLP. -Aspiration precautions.  AKI/azotemia: Likely prerenal.  Resolved. Recent Labs    05/09/21 0457 05/10/21 0111 05/11/21 0112 06/29/21 1115 06/30/21 0434 07/02/21 0255 07/13/21 1706 07/14/21 9562 07/15/21 0606 07/16/21 1308  BUN 24* 31* 31* 28* 26*  CREATININE 0.96 0.98 0.71 0.70 1.06* 0.83 1.28* 0.84 1.05* 0.81  -Continue IV fluid -Monitor  History of seizure: On Depakote at home.  Depakote slightly subtherapeutic at 38. -Continue home  Depakote-changed to liquid form due to dysphagia.  Essential hypertension: Normotensive for most part. -Continue home medications  Mild hyponatremia: Resolved.  Macrocytic anemia: Drop in Hgb likely transition from hemoconcentration to hemodilution with IVF.  Anemia panel consistent with anemia of chronic disease.  Folic acid is slightly low. Recent Labs    05/08/21 1434 05/09/21 0028 05/10/21 0111 05/11/21 0112 06/29/21 1115 06/30/21 0434 07/13/21 1706 07/14/21 0545 07/15/21 0858 07/16/21 0548  HGB 8.1* 7.6* 7.3* 7.3* 9.2* 8.1* 10.0* 7.7* 7.4* 7.4*  -Replace folic acid -Transfuse 1 unit -Monitor H&H  Hypoglycemia: Resolved. -IV fluid as above  Hypomagnesemia: Resolved.  Elevated troponin: 26.  Likely demand ischemia  Generalized weakness/ambulatory dysfunction-reportedly nonambulatory at baseline.  Goal of care counseling: Appropriately DNR/DNI.  See goal of care discussion on 9/2.  Increased nutritional needs Body mass index is 22.82 kg/m. Nutrition Problem: Inadequate oral intake Etiology: lethargy/confusion, chronic illness-Consult dietitian Signs/Symptoms: meal completion < 50%   DVT prophylaxis:  heparin injection 5,000 Units Start: 07/14/21 0600 SCDs Start: 07/14/21 0108  Code Status: DNR/DNI Family Communication: Updated patient's daughter at bedside. Level of care: Telemetry  Status is: Inpatient  Remains inpatient appropriate because:Altered mental status, IV treatments appropriate due to intensity of illness or inability to take PO, and Inpatient level of care appropriate due to severity of illness  Dispo: The patient is from: SNF              Anticipated d/c is to: SNF              Patient currently is not medically stable to d/c.   Difficult to place patient No            Consultants:  None   Sch Meds:  Scheduled Meds:  sodium chloride   Intravenous Once   amLODipine  5 mg Oral Daily   carvedilol  3.125 mg Oral BID WC    feeding supplement  237 mL Oral BID BM   ferrous sulfate  325 mg Oral QPC breakfast   folic acid  1 mg Oral Daily   heparin  5,000 Units Subcutaneous Q8H   multivitamin with minerals  1 tablet Oral Daily   pantoprazole  40 mg Oral Daily   rosuvastatin  10 mg Oral Daily   thiamine  100 mg Oral Daily   valproic acid  750 mg Oral BID   Continuous Infusions:  azithromycin 500 mg (07/16/21 0015)   cefTRIAXone (ROCEPHIN)  IV 2 g (07/15/21 2218)   dextrose 5 % and 0.45% NaCl 100 mL/hr at 07/16/21 0539   PRN Meds:.  Antimicrobials: Anti-infectives (From admission, onward)    Start     Dose/Rate Route Frequency Ordered Stop   07/14/21 2359  azithromycin (ZITHROMAX) 500 mg in sodium chloride 0.9 % 250 mL IVPB        500 mg 250 mL/hr over 60 Minutes Intravenous Every 24 hours 07/14/21 0108 07/19/21 2359   07/14/21 2200  cefTRIAXone (ROCEPHIN) 2 g in sodium chloride 0.9 % 100 mL IVPB        2 g 200 mL/hr over 30 Minutes Intravenous Every 24 hours 07/14/21 0108 07/19/21 2159   07/13/21 2130  cefTRIAXone (ROCEPHIN) 1 g in sodium chloride 0.9 % 100  mL IVPB        1 g 200 mL/hr over 30 Minutes Intravenous  Once 07/13/21 2119 07/13/21 2243   07/13/21 2130  azithromycin (ZITHROMAX) 500 mg in sodium chloride 0.9 % 250 mL IVPB        500 mg 250 mL/hr over 60 Minutes Intravenous  Once 07/13/21 2119 07/14/21 0115        I have personally reviewed the following labs and images: CBC: Recent Labs  Lab 07/13/21 1706 07/14/21 0545 07/15/21 0858 07/16/21 0548  WBC 15.7* 15.3* 12.0* 11.6*  NEUTROABS 9.9*  --  9.2*  --   HGB 10.0* 7.7* 7.4* 7.4*  HCT 32.4* 24.3* 23.2* 23.8*  MCV 104.5* 103.8* 102.7* 103.0*  PLT 172 186 165 157   BMP &GFR Recent Labs  Lab 07/13/21 1706 07/14/21 0545 07/15/21 0606 07/16/21 0548  NA 145 146* 139 144  K 4.8 4.3 3.8 3.9  CL 111 114* 114* 119*  CO2 24 22 20* 16*  GLUCOSE 71 64* 125* 120*  BUN 31* 31* 28* 26*  CREATININE 1.28* 0.84 1.05* 0.81  CALCIUM  8.9 8.6* 8.1* 8.2*  MG  --  1.4* 2.3 2.1  PHOS  --   --  2.3* 2.6   Estimated Creatinine Clearance: 44.9 mL/min (by C-G formula based on SCr of 0.81 mg/dL). Liver & Pancreas: Recent Labs  Lab 07/13/21 1706 07/14/21 0545 07/15/21 0606 07/16/21 0548  AST 36 26  --   --   ALT 11 11  --   --   ALKPHOS 57 54  --   --   BILITOT 0.8 0.5  --   --   PROT 7.1 6.4*  --   --   ALBUMIN 2.6* 2.4* 1.9* 1.9*   No results for input(s): LIPASE, AMYLASE in the last 168 hours. Recent Labs  Lab 07/13/21 1707  AMMONIA 24   Diabetic: No results for input(s): HGBA1C in the last 72 hours. No results for input(s): GLUCAP in the last 168 hours. Cardiac Enzymes: Recent Labs  Lab 07/15/21 0606  CKTOTAL 44   No results for input(s): PROBNP in the last 8760 hours. Coagulation Profile: Recent Labs  Lab 07/13/21 1706  INR 1.2   Thyroid Function Tests: Recent Labs    07/15/21 0606  TSH 1.116   Lipid Profile: No results for input(s): CHOL, HDL, LDLCALC, TRIG, CHOLHDL, LDLDIRECT in the last 72 hours. Anemia Panel: Recent Labs    07/15/21 0606 07/15/21 0858  VITAMINB12 1,415*  --   FOLATE 4.6*  --   FERRITIN 231  --   TIBC 111*  --   IRON 13*  --   RETICCTPCT  --  0.7   Urine analysis:    Component Value Date/Time   COLORURINE YELLOW 07/13/2021 2200   APPEARANCEUR CLEAR 07/13/2021 2200   LABSPEC 1.020 07/13/2021 2200   LABSPEC 1.020 04/23/2006 1313   PHURINE 6.0 07/13/2021 2200   GLUCOSEU NEGATIVE 07/13/2021 2200   GLUCOSEU NEGATIVE 02/28/2016 1559   HGBUR NEGATIVE 07/13/2021 2200   BILIRUBINUR NEGATIVE 07/13/2021 2200   BILIRUBINUR Negative 04/23/2006 1313   KETONESUR NEGATIVE 07/13/2021 2200   PROTEINUR NEGATIVE 07/13/2021 2200   UROBILINOGEN 0.2 02/28/2016 1559   NITRITE NEGATIVE 07/13/2021 2200   LEUKOCYTESUR MODERATE (A) 07/13/2021 2200   LEUKOCYTESUR Trace 04/23/2006 1313   Sepsis Labs: Invalid input(s): PROCALCITONIN, LACTICIDVEN  Microbiology: Recent Results  (from the past 240 hour(s))  Resp Panel by RT-PCR (Flu A&B, Covid) Nasopharyngeal Swab     Status:  None   Collection Time: 07/13/21  5:51 PM   Specimen: Nasopharyngeal Swab; Nasopharyngeal(NP) swabs in vial transport medium  Result Value Ref Range Status   SARS Coronavirus 2 by RT PCR NEGATIVE NEGATIVE Final    Comment: (NOTE) SARS-CoV-2 target nucleic acids are NOT DETECTED.  The SARS-CoV-2 RNA is generally detectable in upper respiratory specimens during the acute phase of infection. The lowest concentration of SARS-CoV-2 viral copies this assay can detect is 138 copies/mL. A negative result does not preclude SARS-Cov-2 infection and should not be used as the sole basis for treatment or other patient management decisions. A negative result may occur with  improper specimen collection/handling, submission of specimen other than nasopharyngeal swab, presence of viral mutation(s) within the areas targeted by this assay, and inadequate number of viral copies(<138 copies/mL). A negative result must be combined with clinical observations, patient history, and epidemiological information. The expected result is Negative.  Fact Sheet for Patients:  BloggerCourse.com  Fact Sheet for Healthcare Providers:  SeriousBroker.it  This test is no t yet approved or cleared by the [place] FDA and  has been authorized for detection and/or diagnosis of SARS-CoV-2 by FDA under an Emergency Use Authorization (EUA). This EUA will remain  in effect (meaning this test can be used) for the duration of the COVID-19 declaration under Section 564(b)(1) of the Act, 21 U.S.C.section 360bbb-3(b)(1), unless the authorization is terminated  or revoked sooner.       Influenza A by PCR NEGATIVE NEGATIVE Final   Influenza B by PCR NEGATIVE NEGATIVE Final    Comment: (NOTE) The Xpert Xpress SARS-CoV-2/FLU/RSV plus assay is intended as an aid in the  diagnosis of influenza from Nasopharyngeal swab specimens and should not be used as a sole basis for treatment. Nasal washings and aspirates are unacceptable for Xpert Xpress SARS-CoV-2/FLU/RSV testing.  Fact Sheet for Patients: BloggerCourse.com  Fact Sheet for Healthcare Providers: SeriousBroker.it  This test is not yet approved or cleared by the [place] FDA and has been authorized for detection and/or diagnosis of SARS-CoV-2 by FDA under an Emergency Use Authorization (EUA). This EUA will remain in effect (meaning this test can be used) for the duration of the COVID-19 declaration under Section 564(b)(1) of the Act, 21 U.S.C. section 360bbb-3(b)(1), unless the authorization is terminated or revoked.  Performed at [facility], [address] [place], [place] [address]   Culture, blood (routine x 2)     Status: None (Preliminary result)   Collection Time: 07/14/21  4:09 PM   Specimen: BLOOD LEFT FOREARM  Result Value Ref Range Status   Specimen Description   Final    BLOOD LEFT FOREARM Performed at [facility], [address] [place], [place] [address]    Special Requests   Final    BOTTLES DRAWN AEROBIC ONLY Blood Culture adequate volume Performed at [facility], [address] [place], [place] [address]    Culture   Final    NO GROWTH 2 DAYS Performed at [facility], [address], [place], [place] [address]    Report Status PENDING  Incomplete  Culture, blood (routine x 2)     Status: None (Preliminary result)   Collection Time: 07/14/21  4:09 PM   Specimen: BLOOD LEFT HAND  Result Value Ref Range Status   Specimen Description   Final    BLOOD LEFT HAND Performed at [facility], [address] [place], [place] [address]  Special Requests   Final    BOTTLES DRAWN AEROBIC ONLY Blood Culture adequate volume Performed at  [facility], [address], [place], [place] [address]    Culture  Setup Time   Final    GRAM POSITIVE COCCI IN CLUSTERS AEROBIC BOTTLE ONLY CRITICAL RESULT CALLED TO, READ BACK BY AND VERIFIED WITH: M,BELL PHARMD  07/16/21 EB Performed at [facility], [address], [place], [place] [address]    Culture GRAM POSITIVE COCCI  Final   Report Status PENDING  Incomplete  Blood Culture ID Panel (Reflexed)     Status: Abnormal   Collection Time: 07/14/21  4:09 PM  Result Value Ref Range Status   Enterococcus faecalis NOT DETECTED NOT DETECTED Final   Enterococcus Faecium NOT DETECTED NOT DETECTED Final   Listeria monocytogenes NOT DETECTED NOT DETECTED Final   Staphylococcus species DETECTED (A) NOT DETECTED Final    Comment: CRITICAL RESULT CALLED TO, READ BACK BY AND VERIFIED WITH: M,BELL PHARMD  07/16/21 EB    Staphylococcus aureus (BCID) NOT DETECTED NOT DETECTED Final   Staphylococcus epidermidis NOT DETECTED NOT DETECTED Final   Staphylococcus lugdunensis NOT DETECTED NOT DETECTED Final   Streptococcus species NOT DETECTED NOT DETECTED Final   Streptococcus agalactiae NOT DETECTED NOT DETECTED Final   Streptococcus pneumoniae NOT DETECTED NOT DETECTED Final   Streptococcus pyogenes NOT DETECTED NOT DETECTED Final   A.calcoaceticus-baumannii NOT DETECTED NOT DETECTED Final   Bacteroides fragilis NOT DETECTED NOT DETECTED Final   Enterobacterales NOT DETECTED NOT DETECTED Final   Enterobacter cloacae complex NOT DETECTED NOT DETECTED Final   Escherichia coli NOT DETECTED NOT DETECTED Final   Klebsiella aerogenes NOT DETECTED NOT DETECTED Final   Klebsiella oxytoca NOT DETECTED NOT DETECTED Final   Klebsiella pneumoniae NOT DETECTED NOT DETECTED Final   Proteus species NOT DETECTED NOT DETECTED Final   Salmonella species NOT DETECTED NOT DETECTED Final   Serratia marcescens NOT DETECTED NOT DETECTED Final   Haemophilus influenzae NOT DETECTED NOT  DETECTED Final   Neisseria meningitidis NOT DETECTED NOT DETECTED Final   Pseudomonas aeruginosa NOT DETECTED NOT DETECTED Final   Stenotrophomonas maltophilia NOT DETECTED NOT DETECTED Final   Candida albicans NOT DETECTED NOT DETECTED Final   Candida auris NOT DETECTED NOT DETECTED Final   Candida glabrata NOT DETECTED NOT DETECTED Final   Candida krusei NOT DETECTED NOT DETECTED Final   Candida parapsilosis NOT DETECTED NOT DETECTED Final   Candida tropicalis NOT DETECTED NOT DETECTED Final   Cryptococcus neoformans/gattii NOT DETECTED NOT DETECTED Final    Comment: Performed at [facility], [address] [place], [place] [address]    Radiology Studies: No results found.     Fredricka Kohrs T. Nunzio Banet [facility]  If 7PM-7AM, please contact night-coverage [email or web address] 07/16/2021, 11:15 AM

## 2021-07-16 NOTE — Progress Notes (Signed)
Initial Nutrition Assessment    INTERVENTION:   Ensure Enlive po BID, each supplement provides 350 kcal and 20 grams of protein  MVI with Minerals daily  Pt receives Magic cup with meals on Dysphagia 1 diet as well, each supplement provides 290 kcal and 9 grams of protein   NUTRITION DIAGNOSIS:   Inadequate oral intake related to lethargy/confusion, chronic illness as evidenced by meal completion < 50%.  GOAL:   Patient will meet greater than or equal to 90% of their needs  MONITOR:   PO intake, Labs, Weight trends  REASON FOR ASSESSMENT:   Malnutrition Screening Tool    ASSESSMENT:   87 yo female admitted with sepsis and respiratory failure secondary to possible aspiration pneumonia, acute metabolic encephalopathy, AKI. PMH includes B12 and Vit D deficiency, macrocytic anemia, HTN, CAD, cognitive impairment.   Pt remains confused.   Recorded po intake 0-75% of meals, 35% on average  Pt with hx of moderate malnutrition, diagnosed as recently as 06/2021.  Stage II pressure injury on sacrum, pt is immobile at baseline  Labs: reviewed Meds: D5-1/2 NS at 100 ml/hr, ferrous sulfate, folic acid, thiamine  Diet Order:   Diet Order             DIET - DYS 1 Room service appropriate? No; Fluid consistency: Thin  Diet effective now                   EDUCATION NEEDS:   Not appropriate for education at this time  Skin:  Skin Assessment: Skin Integrity Issues: Skin Integrity Issues:: Stage II Stage II: sacrum  Last BM:  9/2  Height:   Ht Readings from Last 1 Encounters:  07/13/21 5\' 5"  (1.651 m)    Weight:   Wt Readings from Last 1 Encounters:  07/13/21 62.2 kg     BMI:  Body mass index is 22.82 kg/m.  Estimated Nutritional Needs:   Kcal:  1600-1800 kcals  Protein:  75-85  Fluid:  >/= 1.6 L    09/12/21 MS, RDN, LDN, CNSC Registered Dietitian III Clinical Nutrition RD Pager and On-Call Pager Number Located in [place]

## 2021-07-16 NOTE — Progress Notes (Signed)
PHARMACY - PHYSICIAN COMMUNICATION CRITICAL VALUE ALERT - BLOOD CULTURE IDENTIFICATION (BCID)  [name] is an 87 y.o. female who presented to [facility] on 07/13/2021 with a chief complaint of altered mental status  Assessment:  9/2 BCx 2 (each aerobic  bottles only) 1 of 2: gram positive cocci in clusters, BCID = Staph species - probable contaminant  Name of physician (or Provider) ContactedAlanda Slim via secure chat  Current antibiotics: ceftriaxone & azithromycin for PNA  Changes to prescribed antibiotics recommended: rec no treatment for probable contaminant   Results for orders placed or performed during the hospital encounter of 07/13/21  Blood Culture ID Panel (Reflexed) (Collected: 07/14/2021  4:09 PM)  Result Value Ref Range   Enterococcus faecalis NOT DETECTED NOT DETECTED   Enterococcus Faecium NOT DETECTED NOT DETECTED   Listeria monocytogenes NOT DETECTED NOT DETECTED   Staphylococcus species DETECTED (A) NOT DETECTED   Staphylococcus aureus (BCID) NOT DETECTED NOT DETECTED   Staphylococcus epidermidis NOT DETECTED NOT DETECTED   Staphylococcus lugdunensis NOT DETECTED NOT DETECTED   Streptococcus species NOT DETECTED NOT DETECTED   Streptococcus agalactiae NOT DETECTED NOT DETECTED   Streptococcus pneumoniae NOT DETECTED NOT DETECTED   Streptococcus pyogenes NOT DETECTED NOT DETECTED   A.calcoaceticus-baumannii NOT DETECTED NOT DETECTED   Bacteroides fragilis NOT DETECTED NOT DETECTED   Enterobacterales NOT DETECTED NOT DETECTED   Enterobacter cloacae complex NOT DETECTED NOT DETECTED   Escherichia coli NOT DETECTED NOT DETECTED   Klebsiella aerogenes NOT DETECTED NOT DETECTED   Klebsiella oxytoca NOT DETECTED NOT DETECTED   Klebsiella pneumoniae NOT DETECTED NOT DETECTED   Proteus species NOT DETECTED NOT DETECTED   Salmonella species NOT DETECTED NOT DETECTED   Serratia marcescens NOT DETECTED NOT DETECTED   Haemophilus influenzae NOT DETECTED NOT DETECTED    Neisseria meningitidis NOT DETECTED NOT DETECTED   Pseudomonas aeruginosa NOT DETECTED NOT DETECTED   Stenotrophomonas maltophilia NOT DETECTED NOT DETECTED   Candida albicans NOT DETECTED NOT DETECTED   Candida auris NOT DETECTED NOT DETECTED   Candida glabrata NOT DETECTED NOT DETECTED   Candida krusei NOT DETECTED NOT DETECTED   Candida parapsilosis NOT DETECTED NOT DETECTED   Candida tropicalis NOT DETECTED NOT DETECTED   Cryptococcus neoformans/gattii NOT DETECTED NOT DETECTED    Herby Abraham, Pharm.D 07/16/2021 9:00 AM

## 2021-07-16 NOTE — Progress Notes (Signed)
  Speech Language Pathology Treatment: Dysphagia  Patient Details Name: [name] MRN: [id number] DOB: 1933/06/23 Today's Date: 07/16/2021 Time: 1520-1530 SLP Time Calculation (min) (ACUTE ONLY): 10 min  Assessment / Plan / Recommendation Clinical Impression  Pt reportedly has improved in mental status; she is lethargic this afternoon. Continues to need heavy tactile cueing to recognize PO. Does not sip from straw or cup edge, but will swallow siphoned sips from straw with labial seal after water touches her tongue. She eats pudding well. NT reports she has been difficult to feed due to mentation. No signs of aspiration. Hopeful that mentation will improve more. Likely she will remain on pureed foods into the long term. Will f/u later this week to reassess as needed.   HPI HPI: Pt is a 87 y.o. female who presents the ED from nursing facility with a chief complaint of altered mental status. EMS noted on arrival that pt's oxygen saturation was in the 60s. Son reports that pt has became nonverbal, is not eating, and appears short of breath, which is a change from baseline. CT Head (07/13/21) revealed no acute intracranial abnormality.  Chest xray (07/13/21) signficant for "Low lung volumes with mild to moderate severity bibasilar  atelectasis and/or infiltrate, right greater than left".  PMH: thoracic aortic aneurysm, anxiety, B12 deficiency, coronary artery disease, GERD, hypertension, hyperlipidemia, renal insufficiency, seizure disorder, TIA, MBS (2016) revealed mild oral dysphagia with recommendation of dysphagia 3, thin liquid diet.      SLP Plan          Recommendations  Diet recommendations: Dysphagia 1 (puree);Thin liquid Liquids provided via: Straw;Cup Medication Administration: Crushed with puree Supervision: Full supervision/cueing for compensatory strategies Compensations: Minimize environmental distractions;Slow rate;Small sips/bites                Follow up Recommendations:   (No SLP f/u needed)       GO                [name], Riley Nearing 07/16/2021, 3:31 PM

## 2021-07-17 DIAGNOSIS — M25561 Pain in right knee: Secondary | ICD-10-CM

## 2021-07-17 DIAGNOSIS — E875 Hyperkalemia: Secondary | ICD-10-CM

## 2021-07-17 DIAGNOSIS — M109 Gout, unspecified: Secondary | ICD-10-CM

## 2021-07-17 DIAGNOSIS — M25562 Pain in left knee: Secondary | ICD-10-CM

## 2021-07-17 LAB — RENAL FUNCTION PANEL
Albumin: 1.5 g/dL — ABNORMAL LOW (ref 3.5–5.0)
Albumin: 1.8 g/dL — ABNORMAL LOW (ref 3.5–5.0)
Anion gap: 7 (ref 5–15)
Anion gap: 4 — ABNORMAL LOW (ref 5–15)
BUN: 24 mg/dL — ABNORMAL HIGH (ref 8–23)
BUN: 19 mg/dL (ref 8–23)
CO2: 18 mmol/L — ABNORMAL LOW (ref 22–32)
CO2: 18 mmol/L — ABNORMAL LOW (ref 22–32)
Calcium: 7.6 mg/dL — ABNORMAL LOW (ref 8.9–10.3)
Calcium: 7.9 mg/dL — ABNORMAL LOW (ref 8.9–10.3)
Chloride: 115 mmol/L — ABNORMAL HIGH (ref 98–111)
Chloride: 115 mmol/L — ABNORMAL HIGH (ref 98–111)
Creatinine, Ser: 0.87 mg/dL (ref 0.44–1.00)
Creatinine, Ser: 0.71 mg/dL (ref 0.44–1.00)
GFR, Estimated: >60 mL/min (ref >60)
GFR, Estimated: >60 mL/min (ref >60)
Glucose, Bld: 87 mg/dL (ref 70–99)
Glucose, Bld: 91 mg/dL (ref 70–99)
Phosphorus: 3.9 mg/dL (ref 2.5–4.6)
Phosphorus: 3.3 mg/dL (ref 2.5–4.6)
Potassium: 6.2 mmol/L — ABNORMAL HIGH (ref 3.5–5.1)
Potassium: 3.8 mmol/L (ref 3.5–5.1)
Sodium: 140 mmol/L (ref 135–145)
Sodium: 137 mmol/L (ref 135–145)

## 2021-07-17 LAB — CBC
HCT: 32.8 % — ABNORMAL LOW (ref 36.0–46.0)
Hemoglobin: 10.7 g/dL — ABNORMAL LOW (ref 12.0–15.0)
MCH: 31.7 pg (ref 26.0–34.0)
MCHC: 32.6 g/dL (ref 30.0–36.0)
MCV: 97 fL (ref 80.0–100.0)
Platelets: 86 10*3/uL — ABNORMAL LOW (ref 150–400)
RBC: 3.38 MIL/uL — ABNORMAL LOW (ref 3.87–5.11)
RDW: 16.4 % — ABNORMAL HIGH (ref 11.5–15.5)
WBC: 10.1 10*3/uL (ref 4.0–10.5)
nRBC: 0.2 % (ref 0.0–0.2)

## 2021-07-17 LAB — LEGIONELLA PNEUMOPHILA SEROGP 1 UR AG: L. pneumophila Serogp 1 Ur Ag: NEGATIVE

## 2021-07-17 LAB — BPAM RBC
Blood Product Expiration Date: 202210092359
ISSUE DATE / TIME: 202209042246
Unit Type and Rh: 7300

## 2021-07-17 LAB — TYPE AND SCREEN
ABO/RH(D): B POS
Antibody Screen: POSITIVE
DAT, IgG: NEGATIVE
Donor AG Type: NEGATIVE
Unit division: 0

## 2021-07-17 LAB — URIC ACID: Uric Acid, Serum: 8.6 mg/dL — ABNORMAL HIGH (ref 2.5–7.1)

## 2021-07-17 LAB — MAGNESIUM: Magnesium: 1.8 mg/dL (ref 1.7–2.4)

## 2021-07-17 LAB — SARS CORONAVIRUS 2 (TAT 6-24 HRS): SARS Coronavirus 2: NEGATIVE

## 2021-07-17 MED ORDER — COLCHICINE 0.6 MG PO TABS
0.6000 mg | ORAL_TABLET | Freq: Every day | ORAL | Status: DC
  Administered 2021-07-18: 0.6 mg via ORAL
  Filled 2021-07-17 (×2): qty 1

## 2021-07-17 MED ORDER — DICLOFENAC SODIUM 1 % EX GEL
2.0000 g | Freq: Four times a day (QID) | CUTANEOUS | Status: DC
  Administered 2021-07-17 – 2021-07-18 (×5): 2 g via TOPICAL
  Filled 2021-07-17: qty 100

## 2021-07-17 MED ORDER — AZITHROMYCIN 250 MG PO TABS
500.0000 mg | ORAL_TABLET | Freq: Every day | ORAL | Status: AC
  Administered 2021-07-18: 500 mg via ORAL
  Filled 2021-07-17: qty 2

## 2021-07-17 MED ORDER — COLCHICINE 0.6 MG PO TABS
1.2000 mg | ORAL_TABLET | Freq: Once | ORAL | Status: AC
  Administered 2021-07-17: 1.2 mg via ORAL
  Filled 2021-07-17: qty 2

## 2021-07-17 MED ORDER — ACETAMINOPHEN 160 MG/5ML PO SOLN
650.0000 mg | Freq: Four times a day (QID) | ORAL | Status: DC
  Administered 2021-07-17 – 2021-07-18 (×2): 650 mg via ORAL
  Filled 2021-07-17 (×2): qty 20.3

## 2021-07-17 MED ORDER — SODIUM ZIRCONIUM CYCLOSILICATE 10 G PO PACK
10.0000 g | PACK | Freq: Once | ORAL | Status: AC
  Administered 2021-07-17: 10 g via ORAL
  Filled 2021-07-17: qty 1

## 2021-07-17 NOTE — TOC Initial Note (Signed)
Transition of Care [facility]) - Initial/Assessment Note    Patient Details  Name: [name] MRN: [id number] Date of Birth: 07/01/1933  Transition of Care [facility]) CM/SW Contact:    Ida Rogue, LCSW Phone Number: 07/17/2021, 1:34 PM  Clinical Narrative:   Patient will return to [facility] at d/c is in need of COVID test, insurance authorization.  Insurance is not managed by [employer].  I texted Star to let her know that she would need to get authorization on her end.  She texted back with acknowledgement. TOC will continue to follow during the course of hospitalization.                 Expected Discharge Plan: Skilled Nursing Facility Barriers to Discharge: No Barriers Identified   Patient Goals and CMS Choice        Expected Discharge Plan and Services Expected Discharge Plan: Skilled Nursing Facility                                              Prior Living Arrangements/Services                       Activities of Daily Living Home Assistive Devices/Equipment: Wheelchair (inflatable bed) ADL Screening (condition at time of admission) Patient's cognitive ability adequate to safely complete daily activities?: No Is the patient deaf or have difficulty hearing?: Yes Does the patient have difficulty seeing, even when wearing glasses/contacts?: Yes (left eye is watery more per daughter) Does the patient have difficulty concentrating, remembering, or making decisions?: Yes Patient able to express need for assistance with ADLs?: No Does the patient have difficulty dressing or bathing?: Yes Independently performs ADLs?: No Communication: Needs assistance Is this a change from baseline?: Pre-admission baseline Dressing (OT): Needs assistance Is this a change from baseline?: Pre-admission baseline Grooming: Needs assistance Is this a change from baseline?: Pre-admission baseline Feeding: Needs assistance Is this a change from baseline?: Pre-admission  baseline Bathing: Needs assistance Is this a change from baseline?: Pre-admission baseline Toileting: Needs assistance Is this a change from baseline?: Pre-admission baseline In/Out Bed: Needs assistance Is this a change from baseline?: Pre-admission baseline Walks in Home: Dependent Is this a change from baseline?: Pre-admission baseline Does the patient have difficulty walking or climbing stairs?: Yes Weakness of Legs: Both Weakness of Arms/Hands: Both  Permission Sought/Granted                  Emotional Assessment              Admission diagnosis:  Dehydration [E86.0] Acute respiratory failure with hypoxia (HCC) [J96.01] Altered mental status, unspecified altered mental status type [R41.82] Community acquired pneumonia, unspecified laterality [J18.9] Acute metabolic encephalopathy [G93.41] Patient Active Problem List   Diagnosis Date Noted   Community acquired pneumonia 07/14/2021   Acute metabolic encephalopathy 07/14/2021   Acute respiratory failure with hypoxia (HCC) 07/13/2021   Pressure injury of skin 06/30/2021   Seizure (HCC) 06/29/2021   Anemia 03/31/2021   Palpitations 01/08/2021   AMS (altered mental status) 01/08/2021   History of seizure 01/08/2021   S/P primary angioplasty with coronary stent 01/08/2021   Chronic diastolic CHF (congestive heart failure) (HCC) 01/08/2021   CKD (chronic kidney disease) stage 3, GFR 30-59 ml/min (HCC) 08/29/2020   Depression 08/29/2020   COVID-19 virus infection 02/05/2020   Unstable angina (  HCC) 10/16/2019   Non-ST elevation (NSTEMI) myocardial infarction (HCC) 10/16/2019   Nausea 06/23/2018   CHF (congestive heart failure) (HCC) 04/03/2018   AKI (acute kidney injury) (HCC) 11/20/2017   Dehydration    Generalized weakness 11/18/2017   Second degree burn of foot 11/18/2017   Seizures (HCC) 07/17/2016   Hypertensive urgency 07/17/2016   Acute diastolic CHF (congestive heart failure) (HCC) 07/17/2016   UTI  (urinary tract infection) 02/28/2016   Urinary incontinence 10/28/2015   Alopecia 08/12/2015   TIA (transient ischemic attack) 07/01/2015   Acute encephalopathy 06/24/2015   Protein-calorie malnutrition, severe (HCC) 04/19/2015   Fever    Abdominal abscess    Pressure ulcer 04/07/2015   Acute kidney injury (HCC)    Sepsis (HCC)    Stroke (HCC)    Facial droop    Confusion 03/26/2015   Malnutrition of moderate degree (HCC) 03/20/2015   Small bowel obstruction s/p exlap/LOA/decompression 03/25/2015 03/18/2015   Essential hypertension 03/18/2015   Noncompliance with medication regimen 12/07/2014   Fall against object 06/24/2014   Contusion of left hand 06/24/2014   Contusion of left hip 06/24/2014   Loss of weight 04/12/2014   Malignant hypertension with renal failure and congestive heart failure (HCC) 09/23/2013   Knee pain, bilateral 07/27/2013   Chronic fatigue disorder 01/19/2013   Vaginitis due to Candida 09/02/2012   Sinusitis 09/02/2012   Anemia in other chronic diseases classified elsewhere 09/02/2012   Polymyalgia rheumatica (HCC) 09/10/2011   Vertigo 08/21/2011   Fatigue 05/11/2011   ANEMIA OF CHRONIC DISEASE 09/06/2010   KNEE PAIN, CHRONIC 05/26/2010   DIZZINESS 05/11/2010   SHOULDER PAIN 04/25/2010   FREQUENCY, URINARY 03/16/2010   CONSTIPATION, CHRONIC 01/17/2010   FOOT PAIN 01/17/2009   Rash and other nonspecific skin eruption 01/17/2009   TINNITUS NOS 10/18/2008   B12 deficiency 08/10/2008   LOW BACK PAIN 06/23/2008   CHEST WALL PAIN 06/23/2008   Dysuria 03/18/2008   Abdominal pain 03/18/2008   Hyperlipidemia 12/24/2007   DYSPNEA 12/24/2007   Anxiety disorder 12/07/2007   GERD 12/07/2007   CHEST PAIN 12/07/2007   OTHER SPEC FORMS CHRONIC ISCHEMIC HEART DISEASE 12/01/2007   Type 2 diabetes mellitus with diabetic nephropathy, without long-term current use of insulin (HCC) 11/28/2007   Pain in joint 11/28/2007   Gout 09/17/2007   Adjustment disorder with  mixed anxiety and depressed mood 09/17/2007   Coronary atherosclerosis 09/17/2007   Disorder resulting from impaired renal function 09/17/2007   OSTEOPOROSIS 09/17/2007   DVT, HX OF 09/17/2007   PANCREATITIS, HX OF 09/17/2007   PCP:  Tresa Garter, MD Pharmacy:   [facility] ([facility]) - [place], [place] - [address] [place] [address] Phone: [phone] Fax: [phone]  [facility] [address] [place], [place] - [address] [place] [address] Phone: [phone] Fax: [phone]     Social Determinants of Health (SDOH) Interventions    Readmission Risk Interventions Readmission Risk Prevention Plan 02/08/2020  Transportation Screening Complete  Medication Review (RN Care Manager) Complete  PCP or Specialist appointment within 3-5 days of discharge Complete  HRI or Home Care Consult Complete  SW Recovery Care/Counseling Consult Complete  Palliative Care Screening Not Applicable  Skilled Nursing Facility Complete  Some recent data might be hidden

## 2021-07-17 NOTE — Progress Notes (Signed)
Physical Therapy Treatment Patient Details Name: [name] MRN: [id number] DOB: [birth year] Today's Date: 07/17/2021    History of Present Illness Patient is a 87 y.o. female admitted from SNF with AMS. Dx of AKI, PNA. Pt with medical history significant of hypertension, chronic kidney disease stage II, hyperlipidemia, prediabetes. Recent DC 07/03/21 for metabolic encephalopathy, UTi.    PT Comments    Attempted UE and LE exercises-PROM for all. Pt remains lethargic. Daughter was present. Pt is from SNF and plan is for her to return to SNF (appears to be long term care).     Follow Up Recommendations   (return to SNF)     Equipment Recommendations  None recommended by PT    Recommendations for Other Services       Precautions / Restrictions Precautions Precautions: Fall Restrictions Weight Bearing Restrictions: No    Mobility  Bed Mobility                    Transfers                    Ambulation/Gait                 Stairs             Wheelchair Mobility    Modified Rankin (Stroke Patients Only)       Balance                                            Cognition Arousal/Alertness: Lethargic Behavior During Therapy: Flat affect Overall Cognitive Status: Impaired/Different from baseline Area of Impairment: Orientation;Attention;Memory;Following commands;Safety/judgement;Awareness;Problem solving                 Orientation Level: Place;Time;Situation;Disoriented to     Following Commands: Follows one step commands inconsistently     Problem Solving: Slow processing;Decreased initiation;Difficulty sequencing;Requires verbal cues;Requires tactile cues General Comments: patient had limited ability to respond to questions asked during session.  patient would open eyes some during session but was not for longer than 30 seconds prior to increased lethargicness.      Exercises General Exercises -  Upper Extremity Shoulder Flexion: PROM;Both;5 reps;Supine Elbow Flexion: PROM;Both;5 reps;Supine General Exercises - Lower Extremity Ankle Circles/Pumps: PROM;Both;5 reps Heel Slides:  (pt resisted)    General Comments        Pertinent Vitals/Pain Pain Assessment: Faces Faces Pain Scale: Hurts little more Pain Location: with movement of LEs Pain Descriptors / Indicators: Moaning;Guarding Pain Intervention(s): Limited activity within patient's tolerance    Home Living                      Prior Function            PT Goals (current goals can now be found in the care plan section) Progress towards PT goals: Not progressing toward goals - comment    Frequency    Min 2X/week      PT Plan Current plan remains appropriate    Co-evaluation              AM-PAC PT "6 Clicks" Mobility   Outcome Measure  Help needed turning from your back to your side while in a flat bed without using bedrails?: Total Help needed moving from lying on your back to sitting on the side of a flat bed without  using bedrails?: Total Help needed moving to and from a bed to a chair (including a wheelchair)?: Total Help needed standing up from a chair using your arms (e.g., wheelchair or bedside chair)?: Total Help needed to walk in hospital room?: Total Help needed climbing 3-5 steps with a railing? : Total 6 Click Score: 6    End of Session   Activity Tolerance: Patient limited by pain;Patient limited by lethargy Patient left: in bed;with bed alarm set;with family/visitor present   PT Visit Diagnosis: Pain;Adult, failure to thrive (R62.7);Other abnormalities of gait and mobility (R26.89)     Time: 5176-1607 PT Time Calculation (min) (ACUTE ONLY): 12 min  Charges:  $Therapeutic Exercise: 8-22 mins                         Faye Ramsay, PT Acute Rehabilitation  Office: [phone] Pager: [phone]

## 2021-07-17 NOTE — Progress Notes (Signed)
PROGRESS NOTE  [name]:[id number] DOB: [birth year]   PCP: Tresa Garter, MD  Patient is from: Nursing facility.  Nonambulatory at baseline.  DOA: 07/13/2021 LOS: 3  Chief complaints:  Chief Complaint  Patient presents with   Altered Mental Status     Brief Narrative / Interim history: 87 year old F with PMH of cognitive impairment, CAD, HTN, seizure, anxiety, B12 deficiency, and thoracic aortic aneurysm brought to ED from nursing facility with altered mental status, shortness of breath and hypoxia, and admitted for acute respiratory failure with hypoxia due to community-acquired pneumonia, acute metabolic encephalopathy and AKI.  CXR with bibasilar opacities.  Started on CAP coverage and IV fluid.  Respiratory failure seems to have resolved.  Some improvement in mental status as well.  Subjective: Seen and examined earlier this morning.  No major events overnight of this morning.  She complains of knee pain.  Responds no to chest pain, abdominal pain or shortness of breath.  She is awake but only oriented to self.  Objective: Vitals:   07/16/21 2311 07/17/21 0145 07/17/21 0614 07/17/21 1300  BP: (!) 112/43 (!) 118/58 (!) 156/62 (!) 149/71  Pulse: 92 88 75   Resp: 13 12 18    Temp: 98.3 F (36.8 C) 98.2 F (36.8 C) 98.2 F (36.8 C) 98.6 F (37 C)  TempSrc: Oral Oral Oral Oral  SpO2: 99% 100% 100% 100%  Weight:      Height:        Intake/Output Summary (Last 24 hours) at 07/17/2021 1508 Last data filed at 07/17/2021 0900 Gross per 24 hour  Intake 546 ml  Output 600 ml  Net -54 ml   Filed Weights   07/13/21 1706  Weight: 62.2 kg    Examination:  GENERAL: No apparent distress.  Nontoxic. HEENT: MMM.  Vision and hearing grossly intact.  NECK: Supple.  No apparent JVD.  RESP: 100% on RA.  No IWOB.  Fair aeration bilaterally. CVS:  RRR. Heart sounds normal.  ABD/GI/GU: BS+. Abd soft, NTND.  MSK/EXT:  Moves extremities. No apparent deformity.  Bilateral  joint line tenderness in her knees but no erythema, significant swelling or increased warmth to touch. SKIN: Chronic sacral decubitus. NEURO: Awake.  Oriented to self.  Barely follows commands.  No apparent focal neuro deficit. PSYCH: Calm.  No distress or agitation.  Procedures:  None  Microbiology summarized: COVID-19 and influenza PCR nonreactive. Blood cultures NGTD. Sputum culture pending.  Assessment & Plan: Severe sepsis due to possible aspiration pneumonia meets criteria with leukocytosis, tachycardia, fever and encephalopathy.  CXR with bibasilar opacities.  Has leukocytosis with left shift.  Pro-Cal elevated but improved.  Sepsis physiology resolving.  Blood cultures with staph (not aureus) in aerobic bottles which is likely contaminant. -Continue ceftriaxone and azithromycin to complete 5 days course. -Continue D5-1/2 NS for hydration due to poor p.o. intake. -Aspiration precautions  Acute respiratory failure with hypoxia due to possible aspiration pneumonia-resolved. -Aspiration precautions  Acute metabolic encephalopathy-likely due to respiratory failure, pneumonia, dehydration and possible delirium in the setting of underlying cognitive impairment as well.  No focal neurodeficit but limited exam due to mental status.  UDS, ammonia, B12, TSH and CT head unrevealing.  Depakote level was slightly low at 38. -Treat treatable causes -Aspiration, fall and delirium precautions.  Dysphagia: -Continue dysphagia 1 diet per SLP. -Aspiration precautions.  AKI/azotemia: Likely prerenal.  Resolved. Recent Labs    05/10/21 0111 05/11/21 0112 06/29/21 1115 06/30/21 0434 07/02/21 0255 07/13/21 1706 07/14/21 0545 07/15/21  5621 07/16/21 0548 07/17/21 0504  BUN 31* 31* 28* 26* 24*  CREATININE 0.98 0.71 0.70 1.06* 0.83 1.28* 0.84 1.05* 0.81 0.87  -Continue IV fluid -Monitor  History of seizure: On Depakote at home.  Depakote slightly subtherapeutic at  38. -Continue home Depakote-changed to liquid form due to dysphagia.  Essential hypertension: Normotensive for most part. -Continue home medications  Mild hyponatremia: Resolved.  Macrocytic anemia: Drop in Hgb likely transition from hemoconcentration to hemodilution with IVF.  Anemia panel consistent with anemia of chronic disease.  Folic acid is slightly low.  Improved after 1 unit Recent Labs    05/09/21 0028 05/10/21 0111 05/11/21 0112 06/29/21 1115 06/30/21 0434 07/13/21 1706 07/14/21 0545 07/15/21 0858 07/16/21 0548 07/17/21 0504  HGB 7.6* 7.3* 7.3* 9.2* 8.1* 10.0* 7.7* 7.4* 7.4* 10.7*  -Replace folic acid -Monitor H&H  Hypoglycemia: Resolved. -IV fluid as above  Hypomagnesemia: Resolved.  Hyperkalemia: Unclear etiology of this.  Sample reportedly not hemolyzed. -Lokelma 10 g x 1 -Recheck renal panel in the afternoon  Metabolic acidosis: Likely due to IV fluid.  Improved  Bilateral knee pain/acute gout: Seen for bilateral joint line tenderness but no significant swelling, erythema or increased warmth to touch.  Uric acid elevated to 8.6. -Start p.o. colchicine -Voltaren gel -Scheduled Tylenol  Elevated troponin: 26.  Likely demand ischemia  Generalized weakness/ambulatory dysfunction-reportedly nonambulatory at baseline.  Acute thrombocytopenia-too early for HIT. -Discontinue subcu Lovenox -SCD for VTE prophylaxis -Recheck  Goal of care counseling: Appropriately DNR/DNI.  See goal of care discussion on 9/2.  Increased nutritional needs Body mass index is 22.82 kg/m. Nutrition Problem: Inadequate oral intake Etiology: lethargy/confusion, chronic illness Signs/Symptoms: meal completion < 50%   DVT prophylaxis:  Place and maintain sequential compression device Start: 07/17/21 0749 SCDs Start: 07/14/21 0108  Code Status: DNR/DNI Family Communication: Updated patient's daughter at bedside on 9/4.  None at bedside today.. Level of care:  Telemetry  Status is: Inpatient  Remains inpatient appropriate because:Persistent severe electrolyte disturbances, Altered mental status, Unsafe d/c plan, IV treatments appropriate due to intensity of illness or inability to take PO, and Inpatient level of care appropriate due to severity of illness  Dispo: The patient is from: SNF              Anticipated d/c is to: SNF              Patient currently is not medically stable to d/c.   Difficult to place patient No            Consultants:  None   Sch Meds:  Scheduled Meds:  sodium chloride   Intravenous Once   amLODipine  5 mg Oral Daily   [START ON 07/18/2021] azithromycin  500 mg Oral Daily   carvedilol  3.125 mg Oral BID WC   diclofenac Sodium  2 g Topical QID   feeding supplement  237 mL Oral BID BM   ferrous sulfate  325 mg Oral QPC breakfast   folic acid  1 mg Oral Daily   multivitamin with minerals  1 tablet Oral Daily   pantoprazole  40 mg Oral Daily   rosuvastatin  10 mg Oral Daily   thiamine  100 mg Oral Daily   valproic acid  750 mg Oral BID   Continuous Infusions:  cefTRIAXone (ROCEPHIN)  IV 2 g (07/16/21 2214)   dextrose 5 % and 0.45% NaCl 100 mL/hr at 07/17/21 0230   PRN Meds:.  Antimicrobials: Anti-infectives (From  admission, onward)    Start     Dose/Rate Route Frequency Ordered Stop   07/18/21 1000  azithromycin (ZITHROMAX) tablet 500 mg        500 mg Oral Daily 07/17/21 0732 07/19/21 0959   07/14/21 2359  azithromycin (ZITHROMAX) 500 mg in sodium chloride 0.9 % 250 mL IVPB  Status:  Discontinued        500 mg 250 mL/hr over 60 Minutes Intravenous Every 24 hours 07/14/21 0108 07/17/21 0732   07/14/21 2200  cefTRIAXone (ROCEPHIN) 2 g in sodium chloride 0.9 % 100 mL IVPB        2 g 200 mL/hr over 30 Minutes Intravenous Every 24 hours 07/14/21 0108 07/19/21 2159   07/13/21 2130  cefTRIAXone (ROCEPHIN) 1 g in sodium chloride 0.9 % 100 mL IVPB        1 g 200 mL/hr over 30 Minutes Intravenous  Once  07/13/21 2119 07/13/21 2243   07/13/21 2130  azithromycin (ZITHROMAX) 500 mg in sodium chloride 0.9 % 250 mL IVPB        500 mg 250 mL/hr over 60 Minutes Intravenous  Once 07/13/21 2119 07/14/21 0115        I have personally reviewed the following labs and images: CBC: Recent Labs  Lab 07/13/21 1706 07/14/21 0545 07/15/21 0858 07/16/21 0548 07/17/21 0504  WBC 15.7* 15.3* 12.0* 11.6* 10.1  NEUTROABS 9.9*  --  9.2*  --   --   HGB 10.0* 7.7* 7.4* 7.4* 10.7*  HCT 32.4* 24.3* 23.2* 23.8* 32.8*  MCV 104.5* 103.8* 102.7* 103.0* 97.0  PLT 172 186 165 157 86*   BMP &GFR Recent Labs  Lab 07/13/21 1706 07/14/21 0545 07/15/21 0606 07/16/21 0548 07/17/21 0504  NA 145 146* 139 144 140  K 4.8 4.3 3.8 3.9 6.2*  CL 111 114* 114* 119* 115*  CO2 24 22 20* 16* 18*  GLUCOSE 71 64* 125* 120* 87  BUN 31* 31* 28* 26* 24*  CREATININE 1.28* 0.84 1.05* 0.81 0.87  CALCIUM 8.9 8.6* 8.1* 8.2* 7.6*  MG  --  1.4* 2.3 2.1 1.8  PHOS  --   --  2.3* 2.6 3.9   Estimated Creatinine Clearance: 41.8 mL/min (by C-G formula based on SCr of 0.87 mg/dL). Liver & Pancreas: Recent Labs  Lab 07/13/21 1706 07/14/21 0545 07/15/21 0606 07/16/21 0548 07/17/21 0504  AST 36 26  --   --   --   ALT 11 11  --   --   --   ALKPHOS 57 54  --   --   --   BILITOT 0.8 0.5  --   --   --   PROT 7.1 6.4*  --   --   --   ALBUMIN 2.6* 2.4* 1.9* 1.9* 1.5*   No results for input(s): LIPASE, AMYLASE in the last 168 hours. Recent Labs  Lab 07/13/21 1707  AMMONIA 24   Diabetic: No results for input(s): HGBA1C in the last 72 hours. No results for input(s): GLUCAP in the last 168 hours. Cardiac Enzymes: Recent Labs  Lab 07/15/21 0606  CKTOTAL 44   No results for input(s): PROBNP in the last 8760 hours. Coagulation Profile: Recent Labs  Lab 07/13/21 1706  INR 1.2   Thyroid Function Tests: Recent Labs    07/15/21 0606  TSH 1.116   Lipid Profile: No results for input(s): CHOL, HDL, LDLCALC, TRIG,  CHOLHDL, LDLDIRECT in the last 72 hours. Anemia Panel: Recent Labs    07/15/21 0606  07/15/21 0858  VITAMINB12 1,415*  --   FOLATE 4.6*  --   FERRITIN 231  --   TIBC 111*  --   IRON 13*  --   RETICCTPCT  --  0.7   Urine analysis:    Component Value Date/Time   COLORURINE YELLOW 07/13/2021 2200   APPEARANCEUR CLEAR 07/13/2021 2200   LABSPEC 1.020 07/13/2021 2200   LABSPEC 1.020 04/23/2006 1313   PHURINE 6.0 07/13/2021 2200   GLUCOSEU NEGATIVE 07/13/2021 2200   GLUCOSEU NEGATIVE 02/28/2016 1559   HGBUR NEGATIVE 07/13/2021 2200   BILIRUBINUR NEGATIVE 07/13/2021 2200   BILIRUBINUR Negative 04/23/2006 1313   KETONESUR NEGATIVE 07/13/2021 2200   PROTEINUR NEGATIVE 07/13/2021 2200   UROBILINOGEN 0.2 02/28/2016 1559   NITRITE NEGATIVE 07/13/2021 2200   LEUKOCYTESUR MODERATE (A) 07/13/2021 2200   LEUKOCYTESUR Trace 04/23/2006 1313   Sepsis Labs: Invalid input(s): PROCALCITONIN, LACTICIDVEN  Microbiology: Recent Results (from the past 240 hour(s))  Resp Panel by RT-PCR (Flu A&B, Covid) Nasopharyngeal Swab     Status: None   Collection Time: 07/13/21  5:51 PM   Specimen: Nasopharyngeal Swab; Nasopharyngeal(NP) swabs in vial transport medium  Result Value Ref Range Status   SARS Coronavirus 2 by RT PCR NEGATIVE NEGATIVE Final    Comment: (NOTE) SARS-CoV-2 target nucleic acids are NOT DETECTED.  The SARS-CoV-2 RNA is generally detectable in upper respiratory specimens during the acute phase of infection. The lowest concentration of SARS-CoV-2 viral copies this assay can detect is 138 copies/mL. A negative result does not preclude SARS-Cov-2 infection and should not be used as the sole basis for treatment or other patient management decisions. A negative result may occur with  improper specimen collection/handling, submission of specimen other than nasopharyngeal swab, presence of viral mutation(s) within the areas targeted by this assay, and inadequate number of  viral copies(<138 copies/mL). A negative result must be combined with clinical observations, patient history, and epidemiological information. The expected result is Negative.  Fact Sheet for Patients:  BloggerCourse.com  Fact Sheet for Healthcare Providers:  SeriousBroker.it  This test is no t yet approved or cleared by the [place] FDA and  has been authorized for detection and/or diagnosis of SARS-CoV-2 by FDA under an Emergency Use Authorization (EUA). This EUA will remain  in effect (meaning this test can be used) for the duration of the COVID-19 declaration under Section 564(b)(1) of the Act, 21 U.S.C.section 360bbb-3(b)(1), unless the authorization is terminated  or revoked sooner.       Influenza A by PCR NEGATIVE NEGATIVE Final   Influenza B by PCR NEGATIVE NEGATIVE Final    Comment: (NOTE) The Xpert Xpress SARS-CoV-2/FLU/RSV plus assay is intended as an aid in the diagnosis of influenza from Nasopharyngeal swab specimens and should not be used as a sole basis for treatment. Nasal washings and aspirates are unacceptable for Xpert Xpress SARS-CoV-2/FLU/RSV testing.  Fact Sheet for Patients: BloggerCourse.com  Fact Sheet for Healthcare Providers: SeriousBroker.it  This test is not yet approved or cleared by the [place] FDA and has been authorized for detection and/or diagnosis of SARS-CoV-2 by FDA under an Emergency Use Authorization (EUA). This EUA will remain in effect (meaning this test can be used) for the duration of the COVID-19 declaration under Section 564(b)(1) of the Act, 21 U.S.C. section 360bbb-3(b)(1), unless the authorization is terminated or revoked.  Performed at [facility], [address], [place], [place] [address]   Culture, blood (routine x 2)     Status: None (  Preliminary result)   Collection Time: 07/14/21  4:09  PM   Specimen: BLOOD LEFT FOREARM  Result Value Ref Range Status   Specimen Description   Final    BLOOD LEFT FOREARM Performed at [facility], [address], [place], [place] [address]    Special Requests   Final    BOTTLES DRAWN AEROBIC ONLY Blood Culture adequate volume Performed at [facility], [address], [place], [place] [address]    Culture   Final    NO GROWTH 3 DAYS Performed at [facility], [address], [place], [place] [address]    Report Status PENDING  Incomplete  Culture, blood (routine x 2)     Status: None (Preliminary result)   Collection Time: 07/14/21  4:09 PM   Specimen: BLOOD LEFT HAND  Result Value Ref Range Status   Specimen Description   Final    BLOOD LEFT HAND Performed at [facility], [address] [place] [address], [place], [place] [address]    Special Requests   Final    BOTTLES DRAWN AEROBIC ONLY Blood Culture adequate volume Performed at [facility], [address], [place], [place] [address]    Culture  Setup Time   Final    GRAM POSITIVE COCCI IN CLUSTERS AEROBIC BOTTLE ONLY CRITICAL RESULT CALLED TO, READ BACK BY AND VERIFIED WITH: M,BELL PHARMD  07/16/21 EB    Culture   Final    GRAM POSITIVE COCCI IDENTIFICATION TO FOLLOW Performed at [facility], [address], [place], [place] [address]    Report Status PENDING  Incomplete  Blood Culture ID Panel (Reflexed)     Status: Abnormal   Collection Time: 07/14/21  4:09 PM  Result Value Ref Range Status   Enterococcus faecalis NOT DETECTED NOT DETECTED Final   Enterococcus Faecium NOT DETECTED NOT DETECTED Final   Listeria monocytogenes NOT DETECTED NOT DETECTED Final   Staphylococcus species DETECTED (A) NOT DETECTED Final    Comment: CRITICAL RESULT CALLED TO, READ BACK BY AND VERIFIED WITH: M,BELL PHARMD  07/16/21 EB    Staphylococcus aureus (BCID) NOT DETECTED NOT DETECTED Final    Staphylococcus epidermidis NOT DETECTED NOT DETECTED Final   Staphylococcus lugdunensis NOT DETECTED NOT DETECTED Final   Streptococcus species NOT DETECTED NOT DETECTED Final   Streptococcus agalactiae NOT DETECTED NOT DETECTED Final   Streptococcus pneumoniae NOT DETECTED NOT DETECTED Final   Streptococcus pyogenes NOT DETECTED NOT DETECTED Final   A.calcoaceticus-baumannii NOT DETECTED NOT DETECTED Final   Bacteroides fragilis NOT DETECTED NOT DETECTED Final   Enterobacterales NOT DETECTED NOT DETECTED Final   Enterobacter cloacae complex NOT DETECTED NOT DETECTED Final   Escherichia coli NOT DETECTED NOT DETECTED Final   Klebsiella aerogenes NOT DETECTED NOT DETECTED Final   Klebsiella oxytoca NOT DETECTED NOT DETECTED Final   Klebsiella pneumoniae NOT DETECTED NOT DETECTED Final   Proteus species NOT DETECTED NOT DETECTED Final   Salmonella species NOT DETECTED NOT DETECTED Final   Serratia marcescens NOT DETECTED NOT DETECTED Final   Haemophilus influenzae NOT DETECTED NOT DETECTED Final   Neisseria meningitidis NOT DETECTED NOT DETECTED Final   Pseudomonas aeruginosa NOT DETECTED NOT DETECTED Final   Stenotrophomonas maltophilia NOT DETECTED NOT DETECTED Final   Candida albicans NOT DETECTED NOT DETECTED Final   Candida auris NOT DETECTED NOT DETECTED Final   Candida glabrata NOT DETECTED NOT DETECTED Final   Candida krusei NOT DETECTED NOT DETECTED Final  Candida parapsilosis NOT DETECTED NOT DETECTED Final   Candida tropicalis NOT DETECTED NOT DETECTED Final   Cryptococcus neoformans/gattii NOT DETECTED NOT DETECTED Final    Comment: Performed at [facility], [address], [place], [place] [address]    Radiology Studies: No results found.     Geetika Laborde T. Yuleni Burich [facility]  If 7PM-7AM, please contact night-coverage [email or web address] 07/17/2021, 3:08 PM

## 2021-07-17 NOTE — Care Management Important Message (Signed)
Important Message  Patient Details IM Letter placed in Patient's room for Daughter Lurene Shadow. Name: [name] MRN: [id number] Date of Birth: [birth year]   Medicare Important Message Given:  Yes     Caren Macadam 07/17/2021, 12:05 PM

## 2021-07-18 DIAGNOSIS — R5381 Other malaise: Secondary | ICD-10-CM

## 2021-07-18 LAB — CULTURE, BLOOD (ROUTINE X 2): Special Requests: ADEQUATE

## 2021-07-18 LAB — CK: Total CK: 48 U/L (ref 38–234)

## 2021-07-18 LAB — RENAL FUNCTION PANEL
Albumin: 1.9 g/dL — ABNORMAL LOW (ref 3.5–5.0)
Anion gap: 11 (ref 5–15)
BUN: 20 mg/dL (ref 8–23)
CO2: 19 mmol/L — ABNORMAL LOW (ref 22–32)
Calcium: 8.1 mg/dL — ABNORMAL LOW (ref 8.9–10.3)
Chloride: 110 mmol/L (ref 98–111)
Creatinine, Ser: 0.67 mg/dL (ref 0.44–1.00)
GFR, Estimated: >60 mL/min (ref >60)
Glucose, Bld: 82 mg/dL (ref 70–99)
Phosphorus: 3.6 mg/dL (ref 2.5–4.6)
Potassium: 3.8 mmol/L (ref 3.5–5.1)
Sodium: 140 mmol/L (ref 135–145)

## 2021-07-18 LAB — CBC
HCT: 39.2 % (ref 36.0–46.0)
Hemoglobin: 12.9 g/dL (ref 12.0–15.0)
MCH: 31.5 pg (ref 26.0–34.0)
MCHC: 32.9 g/dL (ref 30.0–36.0)
MCV: 95.8 fL (ref 80.0–100.0)
Platelets: 158 10*3/uL (ref 150–400)
RBC: 4.09 MIL/uL (ref 3.87–5.11)
RDW: 16.7 % — ABNORMAL HIGH (ref 11.5–15.5)
WBC: 6.4 10*3/uL (ref 4.0–10.5)
nRBC: 0 % (ref 0.0–0.2)

## 2021-07-18 LAB — MAGNESIUM: Magnesium: 1.7 mg/dL (ref 1.7–2.4)

## 2021-07-18 MED ORDER — COLCHICINE 0.6 MG PO TABS: 0.6000 mg | ORAL_TABLET | Freq: Every day | ORAL | Status: AC

## 2021-07-18 MED ORDER — ACETAMINOPHEN 500 MG PO TABS: 1000.0000 mg | ORAL_TABLET | Freq: Three times a day (TID) | ORAL | 1 refills | Status: AC

## 2021-07-18 MED ORDER — FOLIC ACID 1 MG PO TABS: 1.0000 mg | ORAL_TABLET | Freq: Every day | ORAL | Status: AC

## 2021-07-18 NOTE — TOC Transition Note (Signed)
Transition of Care [facility]) - CM/SW Discharge Note   Patient Details  Name: [name] MRN: [id number] Date of Birth: [birth year]  Transition of Care [facility]) CM/SW Contact:  Ida Rogue, LCSW Phone Number: 07/18/2021, 10:48 AM   Clinical Narrative:   Patient who is stable for discharge will transfer back to [facility] today.  PTAR arranged.  Nursing, please call report to [phone], room 803A. TOC sign off.    Final next level of care: Skilled Nursing Facility Barriers to Discharge: No Barriers Identified   Patient Goals and CMS Choice        Discharge Placement                       Discharge Plan and Services                                     Social Determinants of Health (SDOH) Interventions     Readmission Risk Interventions Readmission Risk Prevention Plan 02/08/2020  Transportation Screening Complete  Medication Review (RN Care Manager) Complete  PCP or Specialist appointment within 3-5 days of discharge Complete  HRI or Home Care Consult Complete  SW Recovery Care/Counseling Consult Complete  Palliative Care Screening Not Applicable  Skilled Nursing Facility Complete  Some recent data might be hidden

## 2021-07-18 NOTE — [place]2 (Signed)
[place] MEDICAID FL2 LEVEL OF CARE SCREENING TOOL     IDENTIFICATION  Patient Name: [name] Birthdate: May 28, 1933 Sex: female Admission Date (Current Location): 07/13/2021  [facility] and [place] Number:  Producer, television/film/video and Address:  [facility],  [address] [place], [place] [address]      Provider Number: [id number]  Attending Physician Name and Address:  Almon Hercules, MD  Relative Name and Phone Number:  Nathaniel Man [phone]    Current Level of Care: Hospital Recommended Level of Care: Nursing Facility Prior Approval Number:    Date Approved/Denied:   PASRR Number:    Discharge Plan: SNF    Current Diagnoses: Patient Active Problem List   Diagnosis Date Noted   Community acquired pneumonia 07/14/2021   Acute metabolic encephalopathy 07/14/2021   Acute respiratory failure with hypoxia (HCC) 07/13/2021   Pressure injury of skin 06/30/2021   Seizure (HCC) 06/29/2021   Anemia 03/31/2021   Palpitations 01/08/2021   AMS (altered mental status) 01/08/2021   History of seizure 01/08/2021   S/P primary angioplasty with coronary stent 01/08/2021   Chronic diastolic CHF (congestive heart failure) (HCC) 01/08/2021   CKD (chronic kidney disease) stage 3, GFR 30-59 ml/min (HCC) 08/29/2020   Depression 08/29/2020   COVID-19 virus infection 02/05/2020   Unstable angina (HCC) 10/16/2019   Non-ST elevation (NSTEMI) myocardial infarction (HCC) 10/16/2019   Nausea 06/23/2018   CHF (congestive heart failure) (HCC) 04/03/2018   AKI (acute kidney injury) (HCC) 11/20/2017   Dehydration    Generalized weakness 11/18/2017   Second degree burn of foot 11/18/2017   Seizures (HCC) 07/17/2016   Hypertensive urgency 07/17/2016   Acute diastolic CHF (congestive heart failure) (HCC) 07/17/2016   UTI (urinary tract infection) 02/28/2016   Urinary incontinence 10/28/2015   Alopecia 08/12/2015   TIA (transient ischemic attack) 07/01/2015   Acute encephalopathy  06/24/2015   Protein-calorie malnutrition, severe (HCC) 04/19/2015   Fever    Abdominal abscess    Pressure ulcer 04/07/2015   Acute kidney injury (HCC)    Sepsis (HCC)    Stroke (HCC)    Facial droop    Confusion 03/26/2015   Malnutrition of moderate degree (HCC) 03/20/2015   Small bowel obstruction s/p exlap/LOA/decompression 03/25/2015 03/18/2015   Essential hypertension 03/18/2015   Noncompliance with medication regimen 12/07/2014   Fall against object 06/24/2014   Contusion of left hand 06/24/2014   Contusion of left hip 06/24/2014   Loss of weight 04/12/2014   Malignant hypertension with renal failure and congestive heart failure (HCC) 09/23/2013   Knee pain, bilateral 07/27/2013   Chronic fatigue disorder 01/19/2013   Vaginitis due to Candida 09/02/2012   Sinusitis 09/02/2012   Anemia in other chronic diseases classified elsewhere 09/02/2012   Polymyalgia rheumatica (HCC) 09/10/2011   Vertigo 08/21/2011   Fatigue 05/11/2011   ANEMIA OF CHRONIC DISEASE 09/06/2010   KNEE PAIN, CHRONIC 05/26/2010   DIZZINESS 05/11/2010   SHOULDER PAIN 04/25/2010   FREQUENCY, URINARY 03/16/2010   CONSTIPATION, CHRONIC 01/17/2010   FOOT PAIN 01/17/2009   Rash and other nonspecific skin eruption 01/17/2009   TINNITUS NOS 10/18/2008   B12 deficiency 08/10/2008   LOW BACK PAIN 06/23/2008   CHEST WALL PAIN 06/23/2008   Dysuria 03/18/2008   Abdominal pain 03/18/2008   Hyperlipidemia 12/24/2007   DYSPNEA 12/24/2007   Anxiety disorder 12/07/2007   GERD 12/07/2007   CHEST PAIN 12/07/2007   OTHER SPEC FORMS CHRONIC ISCHEMIC HEART DISEASE 12/01/2007   Type  2 diabetes mellitus with diabetic nephropathy, without long-term current use of insulin (HCC) 11/28/2007   Pain in joint 11/28/2007   Gout 09/17/2007   Adjustment disorder with mixed anxiety and depressed mood 09/17/2007   Coronary atherosclerosis 09/17/2007   Disorder resulting from impaired renal function 09/17/2007   OSTEOPOROSIS  09/17/2007   DVT, HX OF 09/17/2007   PANCREATITIS, HX OF 09/17/2007    Orientation RESPIRATION BLADDER Height & Weight     Self  Normal Incontinent Weight: 62.2 kg (per record) Height:   (165.1 cm)  BEHAVIORAL SYMPTOMS/MOOD NEUROLOGICAL BOWEL NUTRITION STATUS      Incontinent Diet (see d/c summary)  AMBULATORY STATUS COMMUNICATION OF NEEDS Skin   Total Care Verbally Other (Comment) (Moisture associated skin damage-Bilateral groin, Pressure injury-Medial Sacrum, Wound-Skin tear L Labia)                       Personal Care Assistance Level of Assistance  Total care           Functional Limitations Info  Sight Sight Info: Impaired Hearing Info: Adequate Speech Info: Adequate    SPECIAL CARE FACTORS FREQUENCY                       Contractures Contractures Info: Not present    Additional Factors Info  Code Status Code Status Info: DNR Allergies Info: Ativan (Lorazepam)   Tramadol Hcl   Amlodipine Besylate   Atenolol   Benazepril   Benicar (Olmesartan Medoxomil)   Cozaar   Hydralazine   Hydrochlorothiazide   Hydrochlorothiazide W-triamterene   Hydrocodone   Hydroxyzine Pamoate   Iodine   Lisinopril   Peach Flavor   Penicillins   Pravastatin   Prednisolone   Strawberry Flavor   Tramadol Hcl   Benadryl (Diphenhydramine)           Current Medications (07/18/2021):  This is the current hospital active medication list Current Facility-Administered Medications  Medication Dose Route Frequency Provider Last Rate Last Admin   0.9 %  sodium chloride infusion (Manually program via Guardrails IV Fluids)   Intravenous Once Almon Hercules, MD       acetaminophen (TYLENOL) 160 MG/5ML solution 650 mg  650 mg Oral Q6H WA Candelaria Stagers T, MD   650 mg at 07/18/21 0814   amLODipine (NORVASC) tablet 5 mg  5 mg Oral Daily Zierle-Ghosh, Asia B, DO   5 mg at 07/18/21 1610   carvedilol (COREG) tablet 3.125 mg  3.125 mg Oral BID WC Zierle-Ghosh, Asia B, DO   3.125 mg at 07/18/21 0813    cefTRIAXone (ROCEPHIN) 2 g in sodium chloride 0.9 % 100 mL IVPB  2 g Intravenous Q24H Zierle-Ghosh, Asia B, DO 200 mL/hr at 07/17/21 2039 2 g at 07/17/21 2039   colchicine tablet 0.6 mg  0.6 mg Oral Daily Gonfa, Taye T, MD   0.6 mg at 07/18/21 0831   dextrose 5 %-0.45 % sodium chloride infusion   Intravenous Continuous Candelaria Stagers T, MD 60 mL/hr at 07/18/21 1034 New Bag at 07/18/21 1034   diclofenac Sodium (VOLTAREN) 1 % topical gel 2 g  2 g Topical QID Candelaria Stagers T, MD   2 g at 07/18/21 0814   feeding supplement (ENSURE ENLIVE / ENSURE PLUS) liquid 237 mL  237 mL Oral BID BM Zierle-Ghosh, Asia B, DO   237 mL at 07/18/21 0814   ferrous sulfate tablet 325 mg  325 mg Oral QPC breakfast Zierle-Ghosh,  Asia B, DO   325 mg at 07/18/21 9604   folic acid (FOLVITE) tablet 1 mg  1 mg Oral Daily Candelaria Stagers T, MD   1 mg at 07/18/21 5409   multivitamin with minerals tablet 1 tablet  1 tablet Oral Daily Candelaria Stagers T, MD   1 tablet at 07/18/21 0812   pantoprazole (PROTONIX) EC tablet 40 mg  40 mg Oral Daily Zierle-Ghosh, Asia B, DO   40 mg at 07/18/21 8119   rosuvastatin (CRESTOR) tablet 10 mg  10 mg Oral Daily Zierle-Ghosh, Asia B, DO   10 mg at 07/18/21 1478   thiamine tablet 100 mg  100 mg Oral Daily Zierle-Ghosh, Asia B, DO   100 mg at 07/18/21 0813   valproic acid (DEPAKENE) 250 MG/5ML solution 750 mg  750 mg Oral BID Candelaria Stagers T, MD   750 mg at 07/18/21 0831     Discharge Medications: Please see discharge summary for a list of discharge medications.  Relevant Imaging Results:  Relevant Lab Results:   Additional Information SSN: [id number] [place], [place]

## 2021-07-18 NOTE — Plan of Care (Signed)
  Problem: Clinical Measurements: Goal: Respiratory complications will improve Outcome: Adequate for Discharge   Problem: Pain Managment: Goal: General experience of comfort will improve Outcome: Adequate for Discharge   Problem: Safety: Goal: Ability to remain free from injury will improve Outcome: Adequate for Discharge   Problem: Skin Integrity: Goal: Risk for impaired skin integrity will decrease Outcome: Adequate for Discharge   Problem: Respiratory: Goal: Ability to maintain adequate ventilation will improve Outcome: Adequate for Discharge Goal: Ability to maintain a clear airway will improve Outcome: Adequate for Discharge

## 2021-07-18 NOTE — Discharge Summary (Signed)
Physician Discharge Summary  TONEKA FULLEN ZOX:[id number] DOB: Dec 13, 1932 DOA: 07/13/2021  PCP: Tresa Garter, MD  Admit date: 07/13/2021 Discharge date: 07/18/2021  Admitted From: SNF Disposition: SNF  Recommendations for Outpatient Follow-up:  Follow ups as below. Please obtain CBC/BMP/Mag at follow up Recommend SLP evaluation and therapy Recommend palliative care follow-up at SNF. Please follow up on the following pending results: None  Discharge Condition: Stable CODE STATUS: DNR/DNI   Hospital Course: 87 year old F with PMH of cognitive impairment, CAD, HTN, seizure, anxiety, B12 deficiency, and thoracic aortic aneurysm brought to ED from nursing facility with altered mental status, shortness of breath and hypoxia, and admitted for acute respiratory failure with hypoxia due to community-acquired pneumonia, acute metabolic encephalopathy and AKI.  CXR with bibasilar opacities.  Started on CAP coverage and IV fluid.   Patient was continued on ceftriaxone and azithromycin.  Respiratory failure resolved.  Mental status improved.  Also received 1 unit of PRBC for anemia with appropriate improvement in her hemoglobin.  She was evaluated by SLP who recommended dysphagia 1 diet.  Therapy recommended return to SNF.   Discharge Diagnoses:  Severe sepsis due to possible aspiration pneumonia: Has leukocytosis, tachycardia, fever and encephalopathy.  CXR with bibasilar opacities.  Pro-Cal elevated but improved.  Sepsis physiology resolved.  Blood cultures with staph (not aureus) in aerobic bottles which is likely contaminant. -Received ceftriaxone and azithromycin for 4 days -Dysphagia 1 diet per SLP recommendation -Aspiration precautions   Acute respiratory failure with hypoxia due to possible aspiration pneumonia-resolved. Dysphagia -Dysphagia 1 diet and aspiration precautions -Continue speech therapy at SNF.   Acute metabolic encephalopathy-likely due to respiratory failure,  pneumonia, dehydration and possible delirium in the setting of underlying cognitive impairment as well.  No focal neurodeficit but limited exam due to mental status.  UDS, ammonia, B12, TSH and CT head unrevealing.  Depakote level was slightly low at 38.  She is oriented to self and hospital. -Treat treatable causes -Aspiration, fall and delirium precautions.  AKI/azotemia: Likely prerenal.  Resolved. Recent Labs    06/29/21 1115 06/30/21 0434 07/02/21 0255 07/13/21 1706 07/14/21 0545 07/15/21 0606 07/16/21 0548 07/17/21 0504 07/17/21 1539 07/18/21 0520  BUN 31* 31* 28* 26* 24* 19 20  CREATININE 0.70 1.06* 0.83 1.28* 0.84 1.05* 0.81 0.87 0.71 0.67  -Recheck renal function in 1 week -Discontinue ibuprofen and aspirin   History of seizure: On Depakote at home.  Depakote slightly subtherapeutic at 38. -Continue home Depakote   Essential hypertension: Normotensive for most part. -Continue home medications   Mild hyponatremia: Resolved.   Macrocytic anemia: Drop in Hgb likely transition from hemoconcentration to hemodilution with IVF.  Anemia panel consistent with anemia of chronic disease.  Folic acid is slightly low.  Improved after 1 unit Recent Labs    05/10/21 0111 05/11/21 0112 06/29/21 1115 06/30/21 0434 07/13/21 1706 07/14/21 0545 07/15/21 0858 07/16/21 0548 07/17/21 0504 07/18/21 0520  HGB 7.3* 7.3* 9.2* 8.1* 10.0* 7.7* 7.4* 7.4* 10.7* 12.9  -Continue folic acid -Recheck CBC in 1 week  Hypoglycemia: In the setting of NPO.  Resolved.   Hypomagnesemia: Resolved.   Hyperkalemia: Resolved.  Metabolic acidosis: Likely due to IV fluid.  Improved -Recheck in about a week   Bilateral knee pain/acute gout: Seen for bilateral joint line tenderness but no significant swelling, erythema or increased warmth to touch.  Uric acid elevated to 8.6. -Started colchicine. -Continue Voltaren gel and scheduled Tylenol   Elevated troponin: 26.  Likely demand  ischemia   Generalized weakness/ambulatory dysfunction-reportedly nonambulatory at baseline.   Acute thrombocytopenia-too early for HIT.  Likely clotted sample.  Resolved.   Goal of care counseling: Appropriately DNR/DNI.  See goal of care discussion on 9/2. -Palliative care follow-up at SNF   Increased nutritional needs Body mass index is 22.82 kg/m. Nutrition Problem: Inadequate oral intake Etiology: lethargy/confusion, chronic illness Signs/Symptoms: meal completion < 50% Interventions: Ensure Enlive (each supplement provides 350kcal and 20 grams of protein), MVI  Pressure skin injury: POA Pressure Injury 06/30/21 Sacrum Medial Stage 2 -  Partial thickness loss of dermis presenting as a shallow open injury with a red, pink wound bed without slough. (Active)  06/30/21 0913  Location: Sacrum  Location Orientation: Medial  Staging: Stage 2 -  Partial thickness loss of dermis presenting as a shallow open injury with a red, pink wound bed without slough.  Wound Description (Comments):   Present on Admission: Yes  -Continue daily dressing  Discharge Exam: Vitals:   07/17/21 2033 07/18/21 0513  BP: (!) 159/86 136/71  Pulse: 74 62  Resp: 18 18  Temp: 97.9 F (36.6 C) 97.8 F (36.6 C)  SpO2: 100% 100%    GENERAL: No apparent distress.  Nontoxic. HEENT: MMM.  Vision and hearing grossly intact.  NECK: Supple.  No apparent JVD.  RESP: 100% on RA at rest.  No IWOB.  Fair aeration bilaterally. CVS:  RRR. Heart sounds normal.  ABD/GI/GU: Bowel sounds present. Soft. Non tender.  MSK/EXT:  Moves extremities. No apparent deformity. No edema.  SKIN: Sacral pressure skin injury NEURO: Sleepy but wakes to voice.  Oriented to self and "[facility]".  No apparent focal neuro deficit but limited exam due to mental status. PSYCH: Calm. Normal affect.   Discharge Instructions  Discharge Instructions     Diet general   Complete by: As directed    Dysphagia 1 diet   Discharge  wound care:   Complete by: As directed    Resume daily wound dressing and offloading for sacral pressure injury Closely monitor for infection   Increase activity slowly   Complete by: As directed       Allergies as of 07/18/2021       Reactions   Ativan [lorazepam] Other (See Comments)   Very confused   Tramadol Hcl Anxiety, Rash   Headache   Amlodipine Besylate Other (See Comments)    dizzy   Atenolol Other (See Comments)   Fatigue   Benazepril Cough   Benicar [olmesartan Medoxomil] Other (See Comments)   HEADACHE   Cozaar Nausea And Vomiting   Hydralazine Other (See Comments)   Hair loss   Hydrochlorothiazide Other (See Comments)   Hydrochlorothiazide W-triamterene Other (See Comments)    dizzy   Hydrocodone Other (See Comments)   HEADACHE   Hydroxyzine Pamoate Other (See Comments)   Per MAR   Iodine Other (See Comments)   Per MAR   Lisinopril Other (See Comments), Cough   Tired & fatigue   Peach Flavor Itching   Penicillins Itching   Has patient had a PCN reaction causing immediate rash, facial/tongue/throat swelling, SOB or lightheadedness with hypotension: No Has patient had a PCN reaction causing severe rash involving mucus membranes or skin necrosis: No Has patient had a PCN reaction that required hospitalization No Has patient had a PCN reaction occurring within the last 10 years: Yes If all of the above answers are "NO", then may proceed with Cephalosporin use. tolerates cephalosporins OK   Pravastatin Other (  See Comments)   Myalgias-muscle pain   Prednisolone Nausea Only, Other (See Comments)   Upset stomach   Strawberry Flavor Itching   Tramadol Hcl Other (See Comments)   headache   Benadryl [diphenhydramine] Itching, Palpitations        Medication List     STOP taking these medications    aspirin EC 81 MG tablet   ibuprofen 800 MG tablet Commonly known as: ADVIL   rosuvastatin 10 MG tablet Commonly known as: CRESTOR       TAKE these  medications    acetaminophen 500 MG tablet Commonly known as: TYLENOL Take 2 tablets (1,000 mg total) by mouth every 8 (eight) hours. What changed: when to take this   amLODipine 5 MG tablet Commonly known as: NORVASC Take 1 tablet (5 mg total) by mouth daily.   B-complex with vitamin C tablet Take 1 tablet by mouth daily.   carvedilol 3.125 MG tablet Commonly known as: COREG Take 1 tablet (3.125 mg total) by mouth 2 (two) times daily with a meal.   cholecalciferol 25 MCG (1000 UNIT) tablet Commonly known as: VITAMIN D Take 1,000 Units by mouth daily.   colchicine 0.6 MG tablet Take 1 tablet (0.6 mg total) by mouth daily. Start taking on: July 19, 2021   diclofenac Sodium 1 % Gel Commonly known as: VOLTAREN Apply 1 application topically 2 (two) times daily. Back pain   divalproex 125 MG DR tablet Commonly known as: DEPAKOTE Take 6 tablets (750 mg total) by mouth 2 (two) times daily. What changed: additional instructions   DULoxetine 20 MG capsule Commonly known as: CYMBALTA Take 20 mg by mouth daily.   feeding supplement (GLUCERNA SHAKE) Liqd Take 237 mLs by mouth 3 (three) times daily between meals.   ferrous sulfate 325 (65 FE) MG tablet Take 1 tablet (325 mg total) by mouth daily.   folic acid 1 MG tablet Commonly known as: FOLVITE Take 1 tablet (1 mg total) by mouth daily. Start taking on: July 19, 2021   Humira Pen 40 MG/0.4ML Pnkt Generic drug: Adalimumab Inject 40 mg into the skin every 14 (fourteen) days. On the 4th and 18th of the month   ondansetron 4 MG disintegrating tablet Commonly known as: ZOFRAN-ODT Take 4 mg by mouth every 6 (six) hours as needed for nausea.   pantoprazole 40 MG tablet Commonly known as: Protonix Take 1 tablet (40 mg total) by mouth daily.   polyethylene glycol 17 g packet Commonly known as: MIRALAX / GLYCOLAX Take 17 g by mouth See admin instructions. 17g orally on Sunday, Monday, Wednesday, Friday, Saturday    Pro-Stat AWC Liqd Take 30 mLs by mouth 2 (two) times daily.   senna-docusate 8.6-50 MG tablet Commonly known as: Senokot-S Take 2 tablets by mouth 2 (two) times daily.   thiamine 100 MG tablet Take 1 tablet (100 mg total) by mouth daily.               Discharge Care Instructions  (From admission, onward)           Start     Ordered   07/18/21 0000  Discharge wound care:       Comments: Resume daily wound dressing and offloading for sacral pressure injury Closely monitor for infection   07/18/21 1610            Consultations: None  Procedures/Studies:   CT HEAD WO CONTRAST  Result Date: 07/13/2021 CLINICAL DATA:  Focal neuro deficit, > 6 hrs, stroke  suspected EXAM: CT HEAD WITHOUT CONTRAST TECHNIQUE: Contiguous axial images were obtained from the base of the skull through the vertex without intravenous contrast. COMPARISON:  06/29/2021 FINDINGS: Brain: No evidence of acute infarction, hemorrhage, hydrocephalus, extra-axial collection or mass lesion/mass effect. Mild low-density changes within the periventricular and subcortical white matter compatible with chronic microvascular ischemic change. Mild diffuse cerebral volume loss. Vascular: Atherosclerotic calcifications involving the large vessels of the skull base. No unexpected hyperdense vessel. Skull: Normal. Negative for fracture or focal lesion. Sinuses/Orbits: No acute finding. Other: None. IMPRESSION: 1. No acute intracranial findings. 2. Chronic microvascular ischemic change and cerebral volume loss. Electronically Signed   By: Duanne Guess D.O.   On: 07/13/2021 18:42   CT HEAD WO CONTRAST ( )  Result Date: 06/29/2021 CLINICAL DATA:  Altered mental status, nonverbal EXAM: CT HEAD WITHOUT CONTRAST TECHNIQUE: Contiguous axial images were obtained from the base of the skull through the vertex without intravenous contrast. COMPARISON:  CT head 05/08/2021 FINDINGS: Brain: There is no evidence of acute  intracranial hemorrhage, extra-axial fluid collection, or infarct. Foci of hypodensity in the subcortical and periventricular white matter nonspecific but likely reflect sequelae of chronic white matter microangiopathy, unchanged. The ventricles are stable in size. There is no midline shift. No mass lesion is identified. And at the sella is noted. Vascular: There is calcification of the bilateral cavernous ICAs. Skull: Normal. Negative for fracture or focal lesion. Sinuses/Orbits: The paranasal sinuses are clear. A left lens implant is noted. The globes and orbits are otherwise unremarkable. Other: The mastoid air cells are clear. IMPRESSION: 1. No acute intracranial pathology. 2. Stable parenchymal volume loss and chronic white matter microangiopathy. Electronically Signed   By: Lesia Hausen M.D.   On: 06/29/2021 12:36   MR BRAIN W WO CONTRAST  Result Date: 06/30/2021 CLINICAL DATA:  Mental status change, worsening or persistent EXAM: MRI HEAD WITHOUT AND WITH CONTRAST TECHNIQUE: Multiplanar, multiecho pulse sequences of the brain and surrounding structures were obtained without and with intravenous contrast. CONTRAST:  6mL GADAVIST GADOBUTROL 1 MMOL/ML IV SOLN COMPARISON:  01/09/2021 FINDINGS: Motion artifact is present. Brain: There is no acute infarction or intracranial hemorrhage. There is no intracranial mass, mass effect, or edema. There is no hydrocephalus or extra-axial fluid collection. Prominence of the ventricles and sulci reflecting stable generalized parenchymal volume loss. Patchy and confluent areas of T2 hyperintensity in the supratentorial white matter most compatible with stable chronic microvascular ischemic changes. Small chronic right cerebellar and left corona radiata infarcts. No abnormal enhancement. Vascular: Major vessel flow voids at the skull base are preserved. Skull and upper cervical spine: Normal marrow signal is preserved. Sinuses/Orbits: Patchy mucosal thickening.  Left lens  replacement. Other: Sella is expanded and empty.  Mastoid air cells are clear. IMPRESSION: Degraded by motion artifact. No evidence of recent infarction, hemorrhage, or mass. No abnormal enhancement. Chronic findings are unchanged from the prior study. Electronically Signed   By: Guadlupe Spanish M.D.   On: 06/30/2021 20:17   DG Chest Portable 1 View  Result Date: 07/13/2021 CLINICAL DATA:  Altered mental status. EXAM: PORTABLE CHEST 1 VIEW COMPARISON:  June 29, 2021 FINDINGS: Low lung volumes are noted. Mild to moderate severity areas of atelectasis and/or infiltrate are seen within the bilateral lung bases, right greater than left. There is no evidence of a pleural effusion or pneumothorax. The heart size and mediastinal contours are within normal limits. Radiopaque surgical clips are seen within the right upper quadrant. The visualized skeletal structures are unremarkable.  IMPRESSION: Low lung volumes with mild to moderate severity bibasilar atelectasis and/or infiltrate, right greater than left. Electronically Signed   By: Aram Candela M.D.   On: 07/13/2021 20:47   DG Chest Port 1 View  Result Date: 06/29/2021 CLINICAL DATA:  Weakness EXAM: PORTABLE CHEST 1 VIEW COMPARISON:  Chest radiograph 05/08/2021 FINDINGS: The cardiomediastinal silhouette is stable. There is calcified atherosclerotic plaque of the aortic arch. Lung volumes are low, resulting in bronchovascular crowding. There is no focal consolidation or pulmonary edema. There is no pleural effusion or pneumothorax There is no acute osseous abnormality. Cholecystectomy clips are noted. IMPRESSION: Low lung volumes. Otherwise, no radiographic evidence of acute cardiopulmonary process. Electronically Signed   By: Lesia Hausen M.D.   On: 06/29/2021 12:29       The results of significant diagnostics from this hospitalization (including imaging, microbiology, ancillary and laboratory) are listed below for reference.     Microbiology: Recent  Results (from the past 240 hour(s))  Resp Panel by RT-PCR (Flu A&B, Covid) Nasopharyngeal Swab     Status: None   Collection Time: 07/13/21  5:51 PM   Specimen: Nasopharyngeal Swab; Nasopharyngeal(NP) swabs in vial transport medium  Result Value Ref Range Status   SARS Coronavirus 2 by RT PCR NEGATIVE NEGATIVE Final    Comment: (NOTE) SARS-CoV-2 target nucleic acids are NOT DETECTED.  The SARS-CoV-2 RNA is generally detectable in upper respiratory specimens during the acute phase of infection. The lowest concentration of SARS-CoV-2 viral copies this assay can detect is 138 copies/mL. A negative result does not preclude SARS-Cov-2 infection and should not be used as the sole basis for treatment or other patient management decisions. A negative result may occur with  improper specimen collection/handling, submission of specimen other than nasopharyngeal swab, presence of viral mutation(s) within the areas targeted by this assay, and inadequate number of viral copies(<138 copies/mL). A negative result must be combined with clinical observations, patient history, and epidemiological information. The expected result is Negative.  Fact Sheet for Patients:  BloggerCourse.com  Fact Sheet for Healthcare Providers:  SeriousBroker.it  This test is no t yet approved or cleared by the [place] FDA and  has been authorized for detection and/or diagnosis of SARS-CoV-2 by FDA under an Emergency Use Authorization (EUA). This EUA will remain  in effect (meaning this test can be used) for the duration of the COVID-19 declaration under Section 564(b)(1) of the Act, 21 U.S.C.section 360bbb-3(b)(1), unless the authorization is terminated  or revoked sooner.       Influenza A by PCR NEGATIVE NEGATIVE Final   Influenza B by PCR NEGATIVE NEGATIVE Final    Comment: (NOTE) The Xpert Xpress SARS-CoV-2/FLU/RSV plus assay is intended as an aid in the  diagnosis of influenza from Nasopharyngeal swab specimens and should not be used as a sole basis for treatment. Nasal washings and aspirates are unacceptable for Xpert Xpress SARS-CoV-2/FLU/RSV testing.  Fact Sheet for Patients: BloggerCourse.com  Fact Sheet for Healthcare Providers: SeriousBroker.it  This test is not yet approved or cleared by the [place] FDA and has been authorized for detection and/or diagnosis of SARS-CoV-2 by FDA under an Emergency Use Authorization (EUA). This EUA will remain in effect (meaning this test can be used) for the duration of the COVID-19 declaration under Section 564(b)(1) of the Act, 21 U.S.C. section 360bbb-3(b)(1), unless the authorization is terminated or revoked.  Performed at [facility], [address], [place], [place] [address]   Culture, blood (routine x 2)  Status: None (Preliminary result)   Collection Time: 07/14/21  4:09 PM   Specimen: BLOOD LEFT FOREARM  Result Value Ref Range Status   Specimen Description   Final    BLOOD LEFT FOREARM Performed at [facility], [address], [place], [place] [address]    Special Requests   Final    BOTTLES DRAWN AEROBIC ONLY Blood Culture adequate volume Performed at [facility], [address] [place], [place] [address]    Culture   Final    NO GROWTH 4 DAYS Performed at [facility], [address], [place], [place] [address]    Report Status PENDING  Incomplete  Culture, blood (routine x 2)     Status: Abnormal   Collection Time: 07/14/21  4:09 PM   Specimen: BLOOD LEFT HAND  Result Value Ref Range Status   Specimen Description   Final    BLOOD LEFT HAND Performed at [facility], [address], [place], [place] [address]    Special Requests   Final    BOTTLES DRAWN AEROBIC ONLY Blood Culture adequate volume Performed at [facility], [address], [place], [place] [address]    Culture  Setup Time   Final    GRAM POSITIVE COCCI IN CLUSTERS AEROBIC BOTTLE ONLY CRITICAL RESULT CALLED TO, READ BACK BY AND VERIFIED WITH: M,BELL PHARMD @0836  07/16/21 EB    Culture (A)  Final    STAPHYLOCOCCUS CAPITIS THE SIGNIFICANCE OF ISOLATING THIS ORGANISM FROM A SINGLE SET OF BLOOD CULTURES WHEN MULTIPLE SETS ARE DRAWN IS UNCERTAIN. PLEASE NOTIFY THE MICROBIOLOGY DEPARTMENT WITHIN ONE WEEK IF SPECIATION AND SENSITIVITIES ARE REQUIRED. Performed at [facility], [address] [place], [place] [address]    Report Status 07/18/2021 FINAL  Final  Blood Culture ID Panel (Reflexed)     Status: Abnormal   Collection Time: 07/14/21  4:09 PM  Result Value Ref Range Status   Enterococcus faecalis NOT DETECTED NOT DETECTED Final   Enterococcus Faecium NOT DETECTED NOT DETECTED Final   Listeria monocytogenes NOT DETECTED NOT DETECTED Final   Staphylococcus species DETECTED (A) NOT DETECTED Final    Comment: CRITICAL RESULT CALLED TO, READ BACK BY AND VERIFIED WITH: M,BELL PHARMD @0836  07/16/21 EB    Staphylococcus aureus (BCID) NOT DETECTED NOT DETECTED Final   Staphylococcus epidermidis NOT DETECTED NOT DETECTED Final   Staphylococcus lugdunensis NOT DETECTED NOT DETECTED Final   Streptococcus species NOT DETECTED NOT DETECTED Final   Streptococcus agalactiae NOT DETECTED NOT DETECTED Final   Streptococcus pneumoniae NOT DETECTED NOT DETECTED Final   Streptococcus pyogenes NOT DETECTED NOT DETECTED Final   A.calcoaceticus-baumannii NOT DETECTED NOT DETECTED Final   Bacteroides fragilis NOT DETECTED NOT DETECTED Final   Enterobacterales NOT DETECTED NOT DETECTED Final   Enterobacter cloacae complex NOT DETECTED NOT DETECTED Final   Escherichia coli NOT DETECTED NOT DETECTED Final   Klebsiella aerogenes NOT DETECTED NOT DETECTED Final   Klebsiella oxytoca NOT DETECTED NOT DETECTED Final   Klebsiella pneumoniae  NOT DETECTED NOT DETECTED Final   Proteus species NOT DETECTED NOT DETECTED Final   Salmonella species NOT DETECTED NOT DETECTED Final   Serratia marcescens NOT DETECTED NOT DETECTED Final   Haemophilus influenzae NOT DETECTED NOT DETECTED Final   Neisseria meningitidis NOT DETECTED NOT DETECTED Final   Pseudomonas aeruginosa NOT DETECTED NOT DETECTED Final   Stenotrophomonas maltophilia NOT DETECTED NOT DETECTED Final   Candida albicans NOT DETECTED NOT  DETECTED Final   Candida auris NOT DETECTED NOT DETECTED Final   Candida glabrata NOT DETECTED NOT DETECTED Final   Candida krusei NOT DETECTED NOT DETECTED Final   Candida parapsilosis NOT DETECTED NOT DETECTED Final   Candida tropicalis NOT DETECTED NOT DETECTED Final   Cryptococcus neoformans/gattii NOT DETECTED NOT DETECTED Final    Comment: Performed at [facility], [address], [place], [place] [address]  SARS CORONAVIRUS 2 (TAT 6-24 HRS) Nasopharyngeal Nasopharyngeal Swab     Status: None   Collection Time: 07/17/21  3:51 PM   Specimen: Nasopharyngeal Swab  Result Value Ref Range Status   SARS Coronavirus 2 NEGATIVE NEGATIVE Final    Comment: (NOTE) SARS-CoV-2 target nucleic acids are NOT DETECTED.  The SARS-CoV-2 RNA is generally detectable in upper and lower respiratory specimens during the acute phase of infection. Negative results do not preclude SARS-CoV-2 infection, do not rule out co-infections with other pathogens, and should not be used as the sole basis for treatment or other patient management decisions. Negative results must be combined with clinical observations, patient history, and epidemiological information. The expected result is Negative.  Fact Sheet for Patients: HairSlick.no  Fact Sheet for Healthcare Providers: quierodirigir.com  This test is not yet approved or cleared by the [place] FDA and  has been authorized for detection  and/or diagnosis of SARS-CoV-2 by FDA under an Emergency Use Authorization (EUA). This EUA will remain  in effect (meaning this test can be used) for the duration of the COVID-19 declaration under Se ction 564(b)(1) of the Act, 21 U.S.C. section 360bbb-3(b)(1), unless the authorization is terminated or revoked sooner.  Performed at [facility], [address] [place], [place] [address]      Labs:  CBC: Recent Labs  Lab 07/13/21 1706 07/14/21 0545 07/15/21 0858 07/16/21 0548 07/17/21 0504 07/18/21 0520  WBC 15.7* 15.3* 12.0* 11.6* 10.1 6.4  NEUTROABS 9.9*  --  9.2*  --   --   --   HGB 10.0* 7.7* 7.4* 7.4* 10.7* 12.9  HCT 32.4* 24.3* 23.2* 23.8* 32.8* 39.2  MCV 104.5* 103.8* 102.7* 103.0* 97.0 95.8  PLT 172 186 165 157 86* 158   BMP &GFR Recent Labs  Lab 07/14/21 0545 07/15/21 0606 07/16/21 0548 07/17/21 0504 07/17/21 1539 07/18/21 0520  NA 146* 139 144 140 137 140  K 4.3 3.8 3.9 6.2* 3.8 3.8  CL 114* 114* 119* 115* 115* 110  CO2 22 20* 16* 18* 18* 19*  GLUCOSE 64* 125* 120* 87 91 82  BUN 31* 28* 26* 24* 19 20  CREATININE 0.84 1.05* 0.81 0.87 0.71 0.67  CALCIUM 8.6* 8.1* 8.2* 7.6* 7.9* 8.1*  MG 1.4* 2.3 2.1 1.8  --  1.7  PHOS  --  2.3* 2.6 3.9 3.3 3.6   Estimated Creatinine Clearance: 45.4 mL/min (by C-G formula based on SCr of 0.67 mg/dL). Liver & Pancreas: Recent Labs  Lab 07/13/21 1706 07/14/21 0545 07/15/21 0606 07/16/21 0548 07/17/21 0504 07/17/21 1539 07/18/21 0520  AST 36 26  --   --   --   --   --   ALT 11 11  --   --   --   --   --   ALKPHOS 57 54  --   --   --   --   --   BILITOT 0.8 0.5  --   --   --   --   --   PROT 7.1 6.4*  --   --   --   --   --  ALBUMIN 2.6* 2.4* 1.9* 1.9* 1.5* 1.8* 1.9*   No results for input(s): LIPASE, AMYLASE in the last 168 hours. Recent Labs  Lab 07/13/21 1707  AMMONIA 24   Diabetic: No results for input(s): HGBA1C in the last 72 hours. No results for input(s): GLUCAP in the last 168  hours. Cardiac Enzymes: Recent Labs  Lab 07/15/21 0606 07/18/21 0520  CKTOTAL 44 48   No results for input(s): PROBNP in the last 8760 hours. Coagulation Profile: Recent Labs  Lab 07/13/21 1706  INR 1.2   Thyroid Function Tests: No results for input(s): TSH, T4TOTAL, FREET4, T3FREE, THYROIDAB in the last 72 hours. Lipid Profile: No results for input(s): CHOL, HDL, LDLCALC, TRIG, CHOLHDL, LDLDIRECT in the last 72 hours. Anemia Panel: No results for input(s): VITAMINB12, FOLATE, FERRITIN, TIBC, IRON, RETICCTPCT in the last 72 hours. Urine analysis:    Component Value Date/Time   COLORURINE YELLOW 07/13/2021 2200   APPEARANCEUR CLEAR 07/13/2021 2200   LABSPEC 1.020 07/13/2021 2200   LABSPEC 1.020 04/23/2006 1313   PHURINE 6.0 07/13/2021 2200   GLUCOSEU NEGATIVE 07/13/2021 2200   GLUCOSEU NEGATIVE 02/28/2016 1559   HGBUR NEGATIVE 07/13/2021 2200   BILIRUBINUR NEGATIVE 07/13/2021 2200   BILIRUBINUR Negative 04/23/2006 1313   KETONESUR NEGATIVE 07/13/2021 2200   PROTEINUR NEGATIVE 07/13/2021 2200   UROBILINOGEN 0.2 02/28/2016 1559   NITRITE NEGATIVE 07/13/2021 2200   LEUKOCYTESUR MODERATE (A) 07/13/2021 2200   LEUKOCYTESUR Trace 04/23/2006 1313   Sepsis Labs: Invalid input(s): PROCALCITONIN, LACTICIDVEN   Time coordinating discharge: 45 minutes  SIGNED:  Almon Hercules, MD  [facility] 07/18/2021, 10:14 AM  If 7PM-7AM, please contact night-coverage [email or web address]

## 2021-07-18 NOTE — Progress Notes (Signed)
Patient discharge via stretcher [facility] came to transport her to [place]. Pt. Is alert to self, daughter [name] at bedside. AVS discharge attached to packet. Report called to [place].

## 2021-07-18 NOTE — Progress Notes (Signed)
Report called to [facility], Discharge AVS attached to discharge packet.

## 2021-07-19 DIAGNOSIS — G40509 Epileptic seizures related to external causes, not intractable, without status epilepticus: Secondary | ICD-10-CM | POA: Diagnosis not present

## 2021-07-19 DIAGNOSIS — M069 Rheumatoid arthritis, unspecified: Secondary | ICD-10-CM | POA: Diagnosis not present

## 2021-07-19 DIAGNOSIS — R4182 Altered mental status, unspecified: Secondary | ICD-10-CM | POA: Diagnosis not present

## 2021-07-19 DIAGNOSIS — A419 Sepsis, unspecified organism: Secondary | ICD-10-CM | POA: Diagnosis not present

## 2021-07-19 DIAGNOSIS — G928 Other toxic encephalopathy: Secondary | ICD-10-CM | POA: Diagnosis not present

## 2021-07-19 DIAGNOSIS — M25661 Stiffness of right knee, not elsewhere classified: Secondary | ICD-10-CM | POA: Diagnosis not present

## 2021-07-19 DIAGNOSIS — M25662 Stiffness of left knee, not elsewhere classified: Secondary | ICD-10-CM | POA: Diagnosis not present

## 2021-07-19 DIAGNOSIS — R2689 Other abnormalities of gait and mobility: Secondary | ICD-10-CM | POA: Diagnosis not present

## 2021-07-19 DIAGNOSIS — M24572 Contracture, left ankle: Secondary | ICD-10-CM | POA: Diagnosis not present

## 2021-07-19 LAB — CULTURE, BLOOD (ROUTINE X 2)
Culture: NO GROWTH
Special Requests: ADEQUATE

## 2021-07-20 DIAGNOSIS — J690 Pneumonitis due to inhalation of food and vomit: Secondary | ICD-10-CM | POA: Diagnosis not present

## 2021-07-20 DIAGNOSIS — R1310 Dysphagia, unspecified: Secondary | ICD-10-CM | POA: Diagnosis not present

## 2021-07-20 DIAGNOSIS — G9341 Metabolic encephalopathy: Secondary | ICD-10-CM | POA: Diagnosis not present

## 2021-07-20 DIAGNOSIS — D649 Anemia, unspecified: Secondary | ICD-10-CM | POA: Diagnosis not present

## 2021-07-20 DIAGNOSIS — J189 Pneumonia, unspecified organism: Secondary | ICD-10-CM | POA: Diagnosis not present

## 2021-07-20 DIAGNOSIS — M24572 Contracture, left ankle: Secondary | ICD-10-CM | POA: Diagnosis not present

## 2021-07-20 DIAGNOSIS — N179 Acute kidney failure, unspecified: Secondary | ICD-10-CM | POA: Diagnosis not present

## 2021-07-20 DIAGNOSIS — R6520 Severe sepsis without septic shock: Secondary | ICD-10-CM | POA: Diagnosis not present

## 2021-07-20 DIAGNOSIS — M25661 Stiffness of right knee, not elsewhere classified: Secondary | ICD-10-CM | POA: Diagnosis not present

## 2021-07-20 DIAGNOSIS — A419 Sepsis, unspecified organism: Secondary | ICD-10-CM | POA: Diagnosis not present

## 2021-07-20 DIAGNOSIS — M109 Gout, unspecified: Secondary | ICD-10-CM | POA: Diagnosis not present

## 2021-07-20 DIAGNOSIS — R4182 Altered mental status, unspecified: Secondary | ICD-10-CM | POA: Diagnosis not present

## 2021-07-20 DIAGNOSIS — M25662 Stiffness of left knee, not elsewhere classified: Secondary | ICD-10-CM | POA: Diagnosis not present

## 2021-07-20 DIAGNOSIS — M069 Rheumatoid arthritis, unspecified: Secondary | ICD-10-CM | POA: Diagnosis not present

## 2021-07-20 DIAGNOSIS — R2689 Other abnormalities of gait and mobility: Secondary | ICD-10-CM | POA: Diagnosis not present

## 2021-07-20 DIAGNOSIS — G928 Other toxic encephalopathy: Secondary | ICD-10-CM | POA: Diagnosis not present

## 2021-07-20 DIAGNOSIS — G40509 Epileptic seizures related to external causes, not intractable, without status epilepticus: Secondary | ICD-10-CM | POA: Diagnosis not present

## 2021-07-21 ENCOUNTER — Inpatient Hospital Stay: Payer: Medicare HMO

## 2021-07-21 ENCOUNTER — Inpatient Hospital Stay: Payer: Medicare HMO | Admitting: Physician Assistant

## 2021-07-21 DIAGNOSIS — G40909 Epilepsy, unspecified, not intractable, without status epilepticus: Secondary | ICD-10-CM | POA: Diagnosis not present

## 2021-07-21 DIAGNOSIS — M069 Rheumatoid arthritis, unspecified: Secondary | ICD-10-CM | POA: Diagnosis not present

## 2021-07-21 DIAGNOSIS — M24572 Contracture, left ankle: Secondary | ICD-10-CM | POA: Diagnosis not present

## 2021-07-21 DIAGNOSIS — R4182 Altered mental status, unspecified: Secondary | ICD-10-CM | POA: Diagnosis not present

## 2021-07-21 DIAGNOSIS — A419 Sepsis, unspecified organism: Secondary | ICD-10-CM | POA: Diagnosis not present

## 2021-07-21 DIAGNOSIS — R2689 Other abnormalities of gait and mobility: Secondary | ICD-10-CM | POA: Diagnosis not present

## 2021-07-21 DIAGNOSIS — M25662 Stiffness of left knee, not elsewhere classified: Secondary | ICD-10-CM | POA: Diagnosis not present

## 2021-07-21 DIAGNOSIS — G40509 Epileptic seizures related to external causes, not intractable, without status epilepticus: Secondary | ICD-10-CM | POA: Diagnosis not present

## 2021-07-21 DIAGNOSIS — G928 Other toxic encephalopathy: Secondary | ICD-10-CM | POA: Diagnosis not present

## 2021-07-21 DIAGNOSIS — M25661 Stiffness of right knee, not elsewhere classified: Secondary | ICD-10-CM | POA: Diagnosis not present

## 2021-07-21 DIAGNOSIS — R1310 Dysphagia, unspecified: Secondary | ICD-10-CM | POA: Diagnosis not present

## 2021-07-23 DIAGNOSIS — R2689 Other abnormalities of gait and mobility: Secondary | ICD-10-CM | POA: Diagnosis not present

## 2021-07-23 DIAGNOSIS — M25662 Stiffness of left knee, not elsewhere classified: Secondary | ICD-10-CM | POA: Diagnosis not present

## 2021-07-23 DIAGNOSIS — G40509 Epileptic seizures related to external causes, not intractable, without status epilepticus: Secondary | ICD-10-CM | POA: Diagnosis not present

## 2021-07-23 DIAGNOSIS — M069 Rheumatoid arthritis, unspecified: Secondary | ICD-10-CM | POA: Diagnosis not present

## 2021-07-23 DIAGNOSIS — G928 Other toxic encephalopathy: Secondary | ICD-10-CM | POA: Diagnosis not present

## 2021-07-23 DIAGNOSIS — A419 Sepsis, unspecified organism: Secondary | ICD-10-CM | POA: Diagnosis not present

## 2021-07-23 DIAGNOSIS — M24572 Contracture, left ankle: Secondary | ICD-10-CM | POA: Diagnosis not present

## 2021-07-23 DIAGNOSIS — R4182 Altered mental status, unspecified: Secondary | ICD-10-CM | POA: Diagnosis not present

## 2021-07-23 DIAGNOSIS — M25661 Stiffness of right knee, not elsewhere classified: Secondary | ICD-10-CM | POA: Diagnosis not present

## 2021-07-24 DIAGNOSIS — M25661 Stiffness of right knee, not elsewhere classified: Secondary | ICD-10-CM | POA: Diagnosis not present

## 2021-07-24 DIAGNOSIS — M24572 Contracture, left ankle: Secondary | ICD-10-CM | POA: Diagnosis not present

## 2021-07-24 DIAGNOSIS — G928 Other toxic encephalopathy: Secondary | ICD-10-CM | POA: Diagnosis not present

## 2021-07-24 DIAGNOSIS — R4182 Altered mental status, unspecified: Secondary | ICD-10-CM | POA: Diagnosis not present

## 2021-07-24 DIAGNOSIS — R2689 Other abnormalities of gait and mobility: Secondary | ICD-10-CM | POA: Diagnosis not present

## 2021-07-24 DIAGNOSIS — M25662 Stiffness of left knee, not elsewhere classified: Secondary | ICD-10-CM | POA: Diagnosis not present

## 2021-07-24 DIAGNOSIS — M069 Rheumatoid arthritis, unspecified: Secondary | ICD-10-CM | POA: Diagnosis not present

## 2021-07-24 DIAGNOSIS — G40509 Epileptic seizures related to external causes, not intractable, without status epilepticus: Secondary | ICD-10-CM | POA: Diagnosis not present

## 2021-07-24 DIAGNOSIS — A419 Sepsis, unspecified organism: Secondary | ICD-10-CM | POA: Diagnosis not present

## 2021-07-25 DIAGNOSIS — L89153 Pressure ulcer of sacral region, stage 3: Secondary | ICD-10-CM | POA: Diagnosis not present

## 2021-07-25 DIAGNOSIS — G928 Other toxic encephalopathy: Secondary | ICD-10-CM | POA: Diagnosis not present

## 2021-07-25 DIAGNOSIS — G40509 Epileptic seizures related to external causes, not intractable, without status epilepticus: Secondary | ICD-10-CM | POA: Diagnosis not present

## 2021-07-25 DIAGNOSIS — A419 Sepsis, unspecified organism: Secondary | ICD-10-CM | POA: Diagnosis not present

## 2021-07-25 DIAGNOSIS — G9341 Metabolic encephalopathy: Secondary | ICD-10-CM | POA: Diagnosis not present

## 2021-07-25 DIAGNOSIS — R2689 Other abnormalities of gait and mobility: Secondary | ICD-10-CM | POA: Diagnosis not present

## 2021-07-25 DIAGNOSIS — M069 Rheumatoid arthritis, unspecified: Secondary | ICD-10-CM | POA: Diagnosis not present

## 2021-07-25 DIAGNOSIS — M24572 Contracture, left ankle: Secondary | ICD-10-CM | POA: Diagnosis not present

## 2021-07-25 DIAGNOSIS — M25661 Stiffness of right knee, not elsewhere classified: Secondary | ICD-10-CM | POA: Diagnosis not present

## 2021-07-25 DIAGNOSIS — M25662 Stiffness of left knee, not elsewhere classified: Secondary | ICD-10-CM | POA: Diagnosis not present

## 2021-07-25 DIAGNOSIS — R4182 Altered mental status, unspecified: Secondary | ICD-10-CM | POA: Diagnosis not present

## 2021-07-25 DIAGNOSIS — N183 Chronic kidney disease, stage 3 unspecified: Secondary | ICD-10-CM | POA: Diagnosis not present

## 2021-07-26 DIAGNOSIS — M25662 Stiffness of left knee, not elsewhere classified: Secondary | ICD-10-CM | POA: Diagnosis not present

## 2021-07-26 DIAGNOSIS — R4182 Altered mental status, unspecified: Secondary | ICD-10-CM | POA: Diagnosis not present

## 2021-07-26 DIAGNOSIS — R2689 Other abnormalities of gait and mobility: Secondary | ICD-10-CM | POA: Diagnosis not present

## 2021-07-26 DIAGNOSIS — M069 Rheumatoid arthritis, unspecified: Secondary | ICD-10-CM | POA: Diagnosis not present

## 2021-07-26 DIAGNOSIS — G40509 Epileptic seizures related to external causes, not intractable, without status epilepticus: Secondary | ICD-10-CM | POA: Diagnosis not present

## 2021-07-26 DIAGNOSIS — L89153 Pressure ulcer of sacral region, stage 3: Secondary | ICD-10-CM | POA: Diagnosis not present

## 2021-07-26 DIAGNOSIS — M24572 Contracture, left ankle: Secondary | ICD-10-CM | POA: Diagnosis not present

## 2021-07-26 DIAGNOSIS — A419 Sepsis, unspecified organism: Secondary | ICD-10-CM | POA: Diagnosis not present

## 2021-07-26 DIAGNOSIS — M25661 Stiffness of right knee, not elsewhere classified: Secondary | ICD-10-CM | POA: Diagnosis not present

## 2021-07-26 DIAGNOSIS — G928 Other toxic encephalopathy: Secondary | ICD-10-CM | POA: Diagnosis not present

## 2021-07-27 DIAGNOSIS — A419 Sepsis, unspecified organism: Secondary | ICD-10-CM | POA: Diagnosis not present

## 2021-07-27 DIAGNOSIS — D649 Anemia, unspecified: Secondary | ICD-10-CM | POA: Diagnosis not present

## 2021-07-27 DIAGNOSIS — R2689 Other abnormalities of gait and mobility: Secondary | ICD-10-CM | POA: Diagnosis not present

## 2021-07-27 DIAGNOSIS — M24572 Contracture, left ankle: Secondary | ICD-10-CM | POA: Diagnosis not present

## 2021-07-27 DIAGNOSIS — Z515 Encounter for palliative care: Secondary | ICD-10-CM | POA: Diagnosis not present

## 2021-07-27 DIAGNOSIS — R1310 Dysphagia, unspecified: Secondary | ICD-10-CM | POA: Diagnosis not present

## 2021-07-27 DIAGNOSIS — M069 Rheumatoid arthritis, unspecified: Secondary | ICD-10-CM | POA: Diagnosis not present

## 2021-07-27 DIAGNOSIS — G40909 Epilepsy, unspecified, not intractable, without status epilepticus: Secondary | ICD-10-CM | POA: Diagnosis not present

## 2021-07-27 DIAGNOSIS — I10 Essential (primary) hypertension: Secondary | ICD-10-CM | POA: Diagnosis not present

## 2021-07-27 DIAGNOSIS — I5032 Chronic diastolic (congestive) heart failure: Secondary | ICD-10-CM | POA: Diagnosis not present

## 2021-07-27 DIAGNOSIS — G928 Other toxic encephalopathy: Secondary | ICD-10-CM | POA: Diagnosis not present

## 2021-07-27 DIAGNOSIS — G40509 Epileptic seizures related to external causes, not intractable, without status epilepticus: Secondary | ICD-10-CM | POA: Diagnosis not present

## 2021-07-27 DIAGNOSIS — M25661 Stiffness of right knee, not elsewhere classified: Secondary | ICD-10-CM | POA: Diagnosis not present

## 2021-07-27 DIAGNOSIS — R4182 Altered mental status, unspecified: Secondary | ICD-10-CM | POA: Diagnosis not present

## 2021-07-27 DIAGNOSIS — M25662 Stiffness of left knee, not elsewhere classified: Secondary | ICD-10-CM | POA: Diagnosis not present

## 2021-07-28 DIAGNOSIS — M24572 Contracture, left ankle: Secondary | ICD-10-CM | POA: Diagnosis not present

## 2021-07-28 DIAGNOSIS — A419 Sepsis, unspecified organism: Secondary | ICD-10-CM | POA: Diagnosis not present

## 2021-07-28 DIAGNOSIS — M069 Rheumatoid arthritis, unspecified: Secondary | ICD-10-CM | POA: Diagnosis not present

## 2021-07-28 DIAGNOSIS — M25662 Stiffness of left knee, not elsewhere classified: Secondary | ICD-10-CM | POA: Diagnosis not present

## 2021-07-28 DIAGNOSIS — G928 Other toxic encephalopathy: Secondary | ICD-10-CM | POA: Diagnosis not present

## 2021-07-28 DIAGNOSIS — R4182 Altered mental status, unspecified: Secondary | ICD-10-CM | POA: Diagnosis not present

## 2021-07-28 DIAGNOSIS — R195 Other fecal abnormalities: Secondary | ICD-10-CM | POA: Diagnosis not present

## 2021-07-28 DIAGNOSIS — D649 Anemia, unspecified: Secondary | ICD-10-CM | POA: Diagnosis not present

## 2021-07-28 DIAGNOSIS — M25661 Stiffness of right knee, not elsewhere classified: Secondary | ICD-10-CM | POA: Diagnosis not present

## 2021-07-28 DIAGNOSIS — R2689 Other abnormalities of gait and mobility: Secondary | ICD-10-CM | POA: Diagnosis not present

## 2021-07-28 DIAGNOSIS — M24571 Contracture, right ankle: Secondary | ICD-10-CM | POA: Diagnosis not present

## 2021-07-28 DIAGNOSIS — G40509 Epileptic seizures related to external causes, not intractable, without status epilepticus: Secondary | ICD-10-CM | POA: Diagnosis not present

## 2021-07-31 DIAGNOSIS — R2689 Other abnormalities of gait and mobility: Secondary | ICD-10-CM | POA: Diagnosis not present

## 2021-07-31 DIAGNOSIS — G928 Other toxic encephalopathy: Secondary | ICD-10-CM | POA: Diagnosis not present

## 2021-07-31 DIAGNOSIS — M069 Rheumatoid arthritis, unspecified: Secondary | ICD-10-CM | POA: Diagnosis not present

## 2021-07-31 DIAGNOSIS — A419 Sepsis, unspecified organism: Secondary | ICD-10-CM | POA: Diagnosis not present

## 2021-07-31 DIAGNOSIS — M25662 Stiffness of left knee, not elsewhere classified: Secondary | ICD-10-CM | POA: Diagnosis not present

## 2021-07-31 DIAGNOSIS — M24572 Contracture, left ankle: Secondary | ICD-10-CM | POA: Diagnosis not present

## 2021-07-31 DIAGNOSIS — G40509 Epileptic seizures related to external causes, not intractable, without status epilepticus: Secondary | ICD-10-CM | POA: Diagnosis not present

## 2021-07-31 DIAGNOSIS — M25661 Stiffness of right knee, not elsewhere classified: Secondary | ICD-10-CM | POA: Diagnosis not present

## 2021-07-31 DIAGNOSIS — R4182 Altered mental status, unspecified: Secondary | ICD-10-CM | POA: Diagnosis not present

## 2021-08-01 DIAGNOSIS — G40509 Epileptic seizures related to external causes, not intractable, without status epilepticus: Secondary | ICD-10-CM | POA: Diagnosis not present

## 2021-08-01 DIAGNOSIS — G928 Other toxic encephalopathy: Secondary | ICD-10-CM | POA: Diagnosis not present

## 2021-08-01 DIAGNOSIS — L89153 Pressure ulcer of sacral region, stage 3: Secondary | ICD-10-CM | POA: Diagnosis not present

## 2021-08-01 DIAGNOSIS — A419 Sepsis, unspecified organism: Secondary | ICD-10-CM | POA: Diagnosis not present

## 2021-08-01 DIAGNOSIS — M24572 Contracture, left ankle: Secondary | ICD-10-CM | POA: Diagnosis not present

## 2021-08-01 DIAGNOSIS — M25662 Stiffness of left knee, not elsewhere classified: Secondary | ICD-10-CM | POA: Diagnosis not present

## 2021-08-01 DIAGNOSIS — M25661 Stiffness of right knee, not elsewhere classified: Secondary | ICD-10-CM | POA: Diagnosis not present

## 2021-08-01 DIAGNOSIS — M069 Rheumatoid arthritis, unspecified: Secondary | ICD-10-CM | POA: Diagnosis not present

## 2021-08-01 DIAGNOSIS — R2689 Other abnormalities of gait and mobility: Secondary | ICD-10-CM | POA: Diagnosis not present

## 2021-08-01 DIAGNOSIS — R4182 Altered mental status, unspecified: Secondary | ICD-10-CM | POA: Diagnosis not present

## 2021-08-02 DIAGNOSIS — M069 Rheumatoid arthritis, unspecified: Secondary | ICD-10-CM | POA: Diagnosis not present

## 2021-08-02 DIAGNOSIS — R4182 Altered mental status, unspecified: Secondary | ICD-10-CM | POA: Diagnosis not present

## 2021-08-02 DIAGNOSIS — M25662 Stiffness of left knee, not elsewhere classified: Secondary | ICD-10-CM | POA: Diagnosis not present

## 2021-08-02 DIAGNOSIS — R2689 Other abnormalities of gait and mobility: Secondary | ICD-10-CM | POA: Diagnosis not present

## 2021-08-02 DIAGNOSIS — M25661 Stiffness of right knee, not elsewhere classified: Secondary | ICD-10-CM | POA: Diagnosis not present

## 2021-08-02 DIAGNOSIS — G40509 Epileptic seizures related to external causes, not intractable, without status epilepticus: Secondary | ICD-10-CM | POA: Diagnosis not present

## 2021-08-02 DIAGNOSIS — A419 Sepsis, unspecified organism: Secondary | ICD-10-CM | POA: Diagnosis not present

## 2021-08-02 DIAGNOSIS — G928 Other toxic encephalopathy: Secondary | ICD-10-CM | POA: Diagnosis not present

## 2021-08-02 DIAGNOSIS — M24572 Contracture, left ankle: Secondary | ICD-10-CM | POA: Diagnosis not present

## 2021-08-03 ENCOUNTER — Other Ambulatory Visit: Payer: Self-pay | Admitting: Physician Assistant

## 2021-08-03 DIAGNOSIS — M069 Rheumatoid arthritis, unspecified: Secondary | ICD-10-CM | POA: Diagnosis not present

## 2021-08-03 DIAGNOSIS — R2689 Other abnormalities of gait and mobility: Secondary | ICD-10-CM | POA: Diagnosis not present

## 2021-08-03 DIAGNOSIS — A419 Sepsis, unspecified organism: Secondary | ICD-10-CM | POA: Diagnosis not present

## 2021-08-03 DIAGNOSIS — R4182 Altered mental status, unspecified: Secondary | ICD-10-CM | POA: Diagnosis not present

## 2021-08-03 DIAGNOSIS — M25662 Stiffness of left knee, not elsewhere classified: Secondary | ICD-10-CM | POA: Diagnosis not present

## 2021-08-03 DIAGNOSIS — M25661 Stiffness of right knee, not elsewhere classified: Secondary | ICD-10-CM | POA: Diagnosis not present

## 2021-08-03 DIAGNOSIS — D649 Anemia, unspecified: Secondary | ICD-10-CM

## 2021-08-03 DIAGNOSIS — G928 Other toxic encephalopathy: Secondary | ICD-10-CM | POA: Diagnosis not present

## 2021-08-03 DIAGNOSIS — M24572 Contracture, left ankle: Secondary | ICD-10-CM | POA: Diagnosis not present

## 2021-08-03 DIAGNOSIS — G40509 Epileptic seizures related to external causes, not intractable, without status epilepticus: Secondary | ICD-10-CM | POA: Diagnosis not present

## 2021-08-04 ENCOUNTER — Inpatient Hospital Stay: Payer: Medicare HMO | Admitting: Physician Assistant

## 2021-08-04 ENCOUNTER — Inpatient Hospital Stay: Payer: Medicare HMO | Attending: Physician Assistant

## 2021-08-04 DIAGNOSIS — M25662 Stiffness of left knee, not elsewhere classified: Secondary | ICD-10-CM | POA: Diagnosis not present

## 2021-08-04 DIAGNOSIS — M069 Rheumatoid arthritis, unspecified: Secondary | ICD-10-CM | POA: Diagnosis not present

## 2021-08-04 DIAGNOSIS — I10 Essential (primary) hypertension: Secondary | ICD-10-CM | POA: Insufficient documentation

## 2021-08-04 DIAGNOSIS — M24572 Contracture, left ankle: Secondary | ICD-10-CM | POA: Diagnosis not present

## 2021-08-04 DIAGNOSIS — G928 Other toxic encephalopathy: Secondary | ICD-10-CM | POA: Diagnosis not present

## 2021-08-04 DIAGNOSIS — R2689 Other abnormalities of gait and mobility: Secondary | ICD-10-CM | POA: Diagnosis not present

## 2021-08-04 DIAGNOSIS — R4182 Altered mental status, unspecified: Secondary | ICD-10-CM | POA: Diagnosis not present

## 2021-08-04 DIAGNOSIS — M25661 Stiffness of right knee, not elsewhere classified: Secondary | ICD-10-CM | POA: Diagnosis not present

## 2021-08-04 DIAGNOSIS — A419 Sepsis, unspecified organism: Secondary | ICD-10-CM | POA: Diagnosis not present

## 2021-08-04 DIAGNOSIS — D539 Nutritional anemia, unspecified: Secondary | ICD-10-CM | POA: Insufficient documentation

## 2021-08-04 DIAGNOSIS — G40509 Epileptic seizures related to external causes, not intractable, without status epilepticus: Secondary | ICD-10-CM | POA: Diagnosis not present

## 2021-08-05 DIAGNOSIS — M25661 Stiffness of right knee, not elsewhere classified: Secondary | ICD-10-CM | POA: Diagnosis not present

## 2021-08-05 DIAGNOSIS — G40509 Epileptic seizures related to external causes, not intractable, without status epilepticus: Secondary | ICD-10-CM | POA: Diagnosis not present

## 2021-08-05 DIAGNOSIS — G928 Other toxic encephalopathy: Secondary | ICD-10-CM | POA: Diagnosis not present

## 2021-08-05 DIAGNOSIS — M069 Rheumatoid arthritis, unspecified: Secondary | ICD-10-CM | POA: Diagnosis not present

## 2021-08-05 DIAGNOSIS — M25662 Stiffness of left knee, not elsewhere classified: Secondary | ICD-10-CM | POA: Diagnosis not present

## 2021-08-05 DIAGNOSIS — M24572 Contracture, left ankle: Secondary | ICD-10-CM | POA: Diagnosis not present

## 2021-08-05 DIAGNOSIS — A419 Sepsis, unspecified organism: Secondary | ICD-10-CM | POA: Diagnosis not present

## 2021-08-05 DIAGNOSIS — R2689 Other abnormalities of gait and mobility: Secondary | ICD-10-CM | POA: Diagnosis not present

## 2021-08-05 DIAGNOSIS — R4182 Altered mental status, unspecified: Secondary | ICD-10-CM | POA: Diagnosis not present

## 2021-08-06 DIAGNOSIS — A419 Sepsis, unspecified organism: Secondary | ICD-10-CM | POA: Diagnosis not present

## 2021-08-06 DIAGNOSIS — M25662 Stiffness of left knee, not elsewhere classified: Secondary | ICD-10-CM | POA: Diagnosis not present

## 2021-08-06 DIAGNOSIS — G928 Other toxic encephalopathy: Secondary | ICD-10-CM | POA: Diagnosis not present

## 2021-08-06 DIAGNOSIS — M069 Rheumatoid arthritis, unspecified: Secondary | ICD-10-CM | POA: Diagnosis not present

## 2021-08-06 DIAGNOSIS — M24572 Contracture, left ankle: Secondary | ICD-10-CM | POA: Diagnosis not present

## 2021-08-06 DIAGNOSIS — M25661 Stiffness of right knee, not elsewhere classified: Secondary | ICD-10-CM | POA: Diagnosis not present

## 2021-08-06 DIAGNOSIS — R2689 Other abnormalities of gait and mobility: Secondary | ICD-10-CM | POA: Diagnosis not present

## 2021-08-06 DIAGNOSIS — R4182 Altered mental status, unspecified: Secondary | ICD-10-CM | POA: Diagnosis not present

## 2021-08-06 DIAGNOSIS — G40509 Epileptic seizures related to external causes, not intractable, without status epilepticus: Secondary | ICD-10-CM | POA: Diagnosis not present

## 2021-08-07 DIAGNOSIS — R2689 Other abnormalities of gait and mobility: Secondary | ICD-10-CM | POA: Diagnosis not present

## 2021-08-07 DIAGNOSIS — R4182 Altered mental status, unspecified: Secondary | ICD-10-CM | POA: Diagnosis not present

## 2021-08-07 DIAGNOSIS — G928 Other toxic encephalopathy: Secondary | ICD-10-CM | POA: Diagnosis not present

## 2021-08-07 DIAGNOSIS — A419 Sepsis, unspecified organism: Secondary | ICD-10-CM | POA: Diagnosis not present

## 2021-08-07 DIAGNOSIS — M25662 Stiffness of left knee, not elsewhere classified: Secondary | ICD-10-CM | POA: Diagnosis not present

## 2021-08-07 DIAGNOSIS — M25661 Stiffness of right knee, not elsewhere classified: Secondary | ICD-10-CM | POA: Diagnosis not present

## 2021-08-07 DIAGNOSIS — M069 Rheumatoid arthritis, unspecified: Secondary | ICD-10-CM | POA: Diagnosis not present

## 2021-08-07 DIAGNOSIS — M24572 Contracture, left ankle: Secondary | ICD-10-CM | POA: Diagnosis not present

## 2021-08-07 DIAGNOSIS — G40509 Epileptic seizures related to external causes, not intractable, without status epilepticus: Secondary | ICD-10-CM | POA: Diagnosis not present

## 2021-08-08 DIAGNOSIS — A419 Sepsis, unspecified organism: Secondary | ICD-10-CM | POA: Diagnosis not present

## 2021-08-08 DIAGNOSIS — M25661 Stiffness of right knee, not elsewhere classified: Secondary | ICD-10-CM | POA: Diagnosis not present

## 2021-08-08 DIAGNOSIS — R4182 Altered mental status, unspecified: Secondary | ICD-10-CM | POA: Diagnosis not present

## 2021-08-08 DIAGNOSIS — R2689 Other abnormalities of gait and mobility: Secondary | ICD-10-CM | POA: Diagnosis not present

## 2021-08-08 DIAGNOSIS — M24572 Contracture, left ankle: Secondary | ICD-10-CM | POA: Diagnosis not present

## 2021-08-08 DIAGNOSIS — M069 Rheumatoid arthritis, unspecified: Secondary | ICD-10-CM | POA: Diagnosis not present

## 2021-08-08 DIAGNOSIS — G40509 Epileptic seizures related to external causes, not intractable, without status epilepticus: Secondary | ICD-10-CM | POA: Diagnosis not present

## 2021-08-08 DIAGNOSIS — L89153 Pressure ulcer of sacral region, stage 3: Secondary | ICD-10-CM | POA: Diagnosis not present

## 2021-08-08 DIAGNOSIS — M25662 Stiffness of left knee, not elsewhere classified: Secondary | ICD-10-CM | POA: Diagnosis not present

## 2021-08-08 DIAGNOSIS — G928 Other toxic encephalopathy: Secondary | ICD-10-CM | POA: Diagnosis not present

## 2021-08-09 DIAGNOSIS — R2689 Other abnormalities of gait and mobility: Secondary | ICD-10-CM | POA: Diagnosis not present

## 2021-08-09 DIAGNOSIS — G928 Other toxic encephalopathy: Secondary | ICD-10-CM | POA: Diagnosis not present

## 2021-08-09 DIAGNOSIS — A419 Sepsis, unspecified organism: Secondary | ICD-10-CM | POA: Diagnosis not present

## 2021-08-09 DIAGNOSIS — M25661 Stiffness of right knee, not elsewhere classified: Secondary | ICD-10-CM | POA: Diagnosis not present

## 2021-08-09 DIAGNOSIS — R4182 Altered mental status, unspecified: Secondary | ICD-10-CM | POA: Diagnosis not present

## 2021-08-09 DIAGNOSIS — M069 Rheumatoid arthritis, unspecified: Secondary | ICD-10-CM | POA: Diagnosis not present

## 2021-08-09 DIAGNOSIS — M25662 Stiffness of left knee, not elsewhere classified: Secondary | ICD-10-CM | POA: Diagnosis not present

## 2021-08-09 DIAGNOSIS — G40509 Epileptic seizures related to external causes, not intractable, without status epilepticus: Secondary | ICD-10-CM | POA: Diagnosis not present

## 2021-08-09 DIAGNOSIS — M24572 Contracture, left ankle: Secondary | ICD-10-CM | POA: Diagnosis not present

## 2021-08-10 DIAGNOSIS — M069 Rheumatoid arthritis, unspecified: Secondary | ICD-10-CM | POA: Diagnosis not present

## 2021-08-10 DIAGNOSIS — R2689 Other abnormalities of gait and mobility: Secondary | ICD-10-CM | POA: Diagnosis not present

## 2021-08-10 DIAGNOSIS — M25661 Stiffness of right knee, not elsewhere classified: Secondary | ICD-10-CM | POA: Diagnosis not present

## 2021-08-10 DIAGNOSIS — R4182 Altered mental status, unspecified: Secondary | ICD-10-CM | POA: Diagnosis not present

## 2021-08-10 DIAGNOSIS — G928 Other toxic encephalopathy: Secondary | ICD-10-CM | POA: Diagnosis not present

## 2021-08-10 DIAGNOSIS — M25662 Stiffness of left knee, not elsewhere classified: Secondary | ICD-10-CM | POA: Diagnosis not present

## 2021-08-10 DIAGNOSIS — M24572 Contracture, left ankle: Secondary | ICD-10-CM | POA: Diagnosis not present

## 2021-08-10 DIAGNOSIS — A419 Sepsis, unspecified organism: Secondary | ICD-10-CM | POA: Diagnosis not present

## 2021-08-10 DIAGNOSIS — G40509 Epileptic seizures related to external causes, not intractable, without status epilepticus: Secondary | ICD-10-CM | POA: Diagnosis not present

## 2021-08-11 ENCOUNTER — Other Ambulatory Visit: Payer: Self-pay

## 2021-08-11 ENCOUNTER — Inpatient Hospital Stay: Payer: Medicare HMO | Admitting: Physician Assistant

## 2021-08-11 ENCOUNTER — Inpatient Hospital Stay: Payer: Medicare HMO

## 2021-08-11 ENCOUNTER — Inpatient Hospital Stay (HOSPITAL_BASED_OUTPATIENT_CLINIC_OR_DEPARTMENT_OTHER): Payer: Medicare HMO | Admitting: Physician Assistant

## 2021-08-11 VITALS — BP 157/73 | HR 91 | Temp 97.0°F | Resp 21

## 2021-08-11 DIAGNOSIS — M25662 Stiffness of left knee, not elsewhere classified: Secondary | ICD-10-CM | POA: Diagnosis not present

## 2021-08-11 DIAGNOSIS — Z7401 Bed confinement status: Secondary | ICD-10-CM | POA: Diagnosis not present

## 2021-08-11 DIAGNOSIS — I10 Essential (primary) hypertension: Secondary | ICD-10-CM | POA: Diagnosis not present

## 2021-08-11 DIAGNOSIS — D539 Nutritional anemia, unspecified: Secondary | ICD-10-CM | POA: Diagnosis not present

## 2021-08-11 DIAGNOSIS — R531 Weakness: Secondary | ICD-10-CM | POA: Diagnosis not present

## 2021-08-11 DIAGNOSIS — M2550 Pain in unspecified joint: Secondary | ICD-10-CM | POA: Diagnosis not present

## 2021-08-11 DIAGNOSIS — G40509 Epileptic seizures related to external causes, not intractable, without status epilepticus: Secondary | ICD-10-CM | POA: Diagnosis not present

## 2021-08-11 DIAGNOSIS — G928 Other toxic encephalopathy: Secondary | ICD-10-CM | POA: Diagnosis not present

## 2021-08-11 DIAGNOSIS — A419 Sepsis, unspecified organism: Secondary | ICD-10-CM | POA: Diagnosis not present

## 2021-08-11 DIAGNOSIS — M069 Rheumatoid arthritis, unspecified: Secondary | ICD-10-CM | POA: Diagnosis not present

## 2021-08-11 DIAGNOSIS — D649 Anemia, unspecified: Secondary | ICD-10-CM

## 2021-08-11 DIAGNOSIS — R4182 Altered mental status, unspecified: Secondary | ICD-10-CM | POA: Diagnosis not present

## 2021-08-11 DIAGNOSIS — M25661 Stiffness of right knee, not elsewhere classified: Secondary | ICD-10-CM | POA: Diagnosis not present

## 2021-08-11 DIAGNOSIS — M24572 Contracture, left ankle: Secondary | ICD-10-CM | POA: Diagnosis not present

## 2021-08-11 DIAGNOSIS — R2689 Other abnormalities of gait and mobility: Secondary | ICD-10-CM | POA: Diagnosis not present

## 2021-08-11 LAB — IRON AND TIBC
Iron: 100 ug/dL (ref 41–142)
Saturation Ratios: 53 % (ref 21–57)
TIBC: 190 ug/dL — ABNORMAL LOW (ref 236–444)
UIBC: 90 ug/dL — ABNORMAL LOW (ref 120–384)

## 2021-08-11 LAB — FERRITIN: Ferritin: 655 ng/mL — ABNORMAL HIGH (ref 11–307)

## 2021-08-11 LAB — C-REACTIVE PROTEIN: CRP: 1 mg/dL — ABNORMAL HIGH (ref <1.0)

## 2021-08-11 LAB — FOLATE: Folate: 8.2 ng/mL (ref >5.9)

## 2021-08-11 NOTE — Progress Notes (Signed)
[facility] [phone]) [phone]   [phone]) [phone]  PROGRESS NOTE  Patient Care Team: Plotnikov, [name], MD as PCP - Reatha Armour, MD as PCP - Cardiology (Cardiology) Stacey Drain, MD as Attending Physician (Internal Medicine)  Hematological/Oncological History 1) Labs from Dr. Blenda Mounts from [facility] -02/10/2021: WBC: 4.8, Hgb 7.4 (L), MCV 95.0, Plt 151, Sed Rate 127 (H),  -03/10/2021: WBC 2.5 (L), Hgb 7.0 (L), MCV 94.6, Plt 65 (L) -03/20/2021: WBC 3.6 (L), Hgb 6.8 (L), MCV 95.2 (L), Plt 116 (L)  2) 03/21/2021: Presented to ED due to anemia. -WBC 4.3, Hgb 7.8, MCV 103.3 (H), Plt 155 -Started ferrous sulfate 325 mg once daily.   3) 03/31/2021: Establish care with Georga Kaufmann PA-C   CHIEF COMPLAINTS/PURPOSE OF CONSULTATION:  Macrocytic Anemia  HISTORY OF PRESENTING ILLNESS:  [name] 87 y.o. female returns for a follow up for macrocytic anemia. Patient is currently residing in a rehab facility. Her last visit was on 03/31/2021. Since the last visit, she had several hospitalization with most recent admission from 07/13/2021-07/18/2021 for for acute respiratory failure with hypoxia due to community-acquired pneumonia, acute metabolic encephalopathy and AKI.    On exam today, Ms. Lemus is accompanied by her daughter for this visit who is patient's historian. Due to multiple comborbities, she is confined to her bed. She needs assistance to complete all her daily activities including bathing, eating and getting dressed. She presented today on a stretcher. Patient denies nausea, vomiting or abdominal pan. Patient uses stool softeners or suppositories as needed for constipation. There is no evidence of easy bruising or signs of bleeding. She has a pressure ulcer on her sacrum that is slowly healing. Patient denies any fevers, chills, night sweats, shortness of breath, chest pain or cough. She has no other complaints.  Rest of the 10 point ROS is below.   MEDICAL HISTORY:  Past Medical History:  Diagnosis Date   Anemia    iron deficiency   Aneurysm, thoracic aortic (HCC)    Anxiety    B12 deficiency    CAD (coronary artery disease)    s/p stenting of LAD 1999- cath 5-08 EF normal LAD 30-40% restenosis. D1 50% D2 80% LCX & RCA minimal plaque   Chronic back pain    Constipation    Depression    GERD (gastroesophageal reflux disease)    Gout    HTN (hypertension)    Hyperlipemia    Obesity    Osteoporosis    Pancreatitis    Polyarthritis    DJD/ possible PMR   Renal insufficiency    Cr 1.2-1.3   Seizures (HCC)    Tinnitus    Urinary frequency    Vertigo    Vitamin D deficiency     SURGICAL HISTORY: Past Surgical History:  Procedure Laterality Date   ABDOMINAL HYSTERECTOMY     CARDIAC CATHETERIZATION  2008   L main 20%, LAD stent patent, D1 50%, D2 80% (small), RCA 20%, EF 55-60%   CHOLECYSTECTOMY     CORONARY ANGIOPLASTY WITH STENT PLACEMENT  1999   LAD stent   CORONARY STENT INTERVENTION N/A 10/16/2019   Procedure: CORONARY STENT INTERVENTION;  Surgeon: Marykay Lex, MD;  Location: [facility] INVASIVE CV LAB;  Service: Cardiovascular;  Laterality: N/A;   HEMORRHOID SURGERY     LAPAROSCOPIC LYSIS OF ADHESIONS N/A 03/24/2015   Procedure: LAPAROSCOPIC LYSIS OF ADHESIONS;  Surgeon: Luretha Murphy, MD;  Location: [facility] ORS;  Service: General;  Laterality: N/A;   LAPAROSCOPY N/A 03/24/2015   Procedure: LAPAROSCOPY DIAGNOSTIC;  Surgeon: Luretha Murphy, MD;  Location: [facility] ORS;  Service: General;  Laterality: N/A;   LAPAROTOMY N/A 03/24/2015   Procedure: LAPAROTOMY with decompression of bowel;  Surgeon: Luretha Murphy, MD;  Location: [facility] ORS;  Service: General;  Laterality: N/A;   LEFT HEART CATH AND CORONARY ANGIOGRAPHY N/A 10/16/2019   Procedure: LEFT HEART CATH AND CORONARY ANGIOGRAPHY;  Surgeon: Marykay Lex, MD;  Location: [facility] INVASIVE CV LAB;  Service: Cardiovascular;  Laterality: N/A;   TUBAL  LIGATION      SOCIAL HISTORY: Social History   Socioeconomic History   Marital status: Married    Spouse name: Not on file   Number of children: Not on file   Years of education: Not on file   Highest education level: Not on file  Occupational History   Occupation: Retired  Tobacco Use   Smoking status: Never   Smokeless tobacco: Never  Substance and Sexual Activity   Alcohol use: No   Drug use: No   Sexual activity: Not Currently  Other Topics Concern   Not on file  Social History Narrative   She lives in [place] with her husband   She is retired   No tobacco or alcohol use.   Right handed   Social Determinants of Health   Financial Resource Strain: Not on file  Food Insecurity: Not on file  Transportation Needs: Not on file  Physical Activity: Not on file  Stress: Not on file  Social Connections: Not on file  Intimate Partner Violence: Not on file    FAMILY HISTORY: Family History  Adopted: Yes  Problem Relation Age of Onset   Diabetes Mother    Hypertension Father    Cancer Sister     ALLERGIES:  is allergic to ativan [lorazepam], tramadol hcl, amlodipine besylate, atenolol, benazepril, benicar [olmesartan medoxomil], cozaar, hydralazine, hydrochlorothiazide, hydrochlorothiazide w-triamterene, hydrocodone, hydroxyzine pamoate, iodine, lisinopril, peach flavor, penicillins, pravastatin, prednisolone, strawberry flavor, tramadol hcl, and benadryl [diphenhydramine].  MEDICATIONS:  Current Outpatient Medications  Medication Sig Dispense Refill   acetaminophen (TYLENOL) 500 MG tablet Take 2 tablets (1,000 mg total) by mouth every 8 (eight) hours.  1   Amino Acids-Protein Hydrolys (PRO-STAT AWC) LIQD Take 30 mLs by mouth 2 (two) times daily.     amLODipine (NORVASC) 5 MG tablet Take 1 tablet (5 mg total) by mouth daily. (Patient not taking: Reported on 07/14/2021)     B Complex-C (B-COMPLEX WITH VITAMIN C) tablet Take 1 tablet by mouth daily.     carvedilol  (COREG) 3.125 MG tablet Take 1 tablet (3.125 mg total) by mouth 2 (two) times daily with a meal.     cholecalciferol (VITAMIN D) 25 MCG (1000 UNIT) tablet Take 1,000 Units by mouth daily.     colchicine 0.6 MG tablet Take 1 tablet (0.6 mg total) by mouth daily.     diclofenac Sodium (VOLTAREN) 1 % GEL Apply 1 application topically 2 (two) times daily. Back pain     divalproex (DEPAKOTE) 125 MG DR tablet Take 6 tablets (750 mg total) by mouth 2 (two) times daily. (Patient taking differently: Take 750 mg by mouth 2 (two) times daily. 6 tablets = 750 mg twice daily)     DULoxetine (CYMBALTA) 20 MG capsule Take 20 mg by mouth daily.     feeding supplement, GLUCERNA SHAKE, (GLUCERNA SHAKE) LIQD Take 237 mLs by mouth 3 (three) times daily between meals. (Patient not taking: Reported  on 06/29/2021)  0   ferrous sulfate 325 (65 FE) MG tablet Take 1 tablet (325 mg total) by mouth daily. 90 tablet 0   folic acid (FOLVITE) 1 MG tablet Take 1 tablet (1 mg total) by mouth daily.     HUMIRA PEN 40 MG/0.4ML PNKT Inject 40 mg into the skin every 14 (fourteen) days. On the 4th and 18th of the month     ondansetron (ZOFRAN-ODT) 4 MG disintegrating tablet Take 4 mg by mouth every 6 (six) hours as needed for nausea.     pantoprazole (PROTONIX) 40 MG tablet Take 1 tablet (40 mg total) by mouth daily. 30 tablet 0   polyethylene glycol (MIRALAX / GLYCOLAX) 17 g packet Take 17 g by mouth See admin instructions. 17g orally on Sunday, Monday, Wednesday, Friday, Saturday     senna-docusate (SENOKOT-S) 8.6-50 MG tablet Take 2 tablets by mouth 2 (two) times daily.     thiamine 100 MG tablet Take 1 tablet (100 mg total) by mouth daily.     No current facility-administered medications for this visit.    REVIEW OF SYSTEMS:   Constitutional: ( - ) fevers, ( - )  chills , ( - ) night sweats Eyes: ( - ) blurriness of vision, ( - ) double vision, ( - ) watery eyes Ears, nose, mouth, throat, and face: ( - ) mucositis, ( - ) sore  throat Respiratory: ( - ) cough, ( - ) dyspnea, ( - ) wheezes Cardiovascular: ( - ) palpitation, ( - ) chest discomfort, ( - ) lower extremity swelling Gastrointestinal:  ( - ) nausea, ( - ) heartburn, ( +)change in bowel habits Skin: ( - ) abnormal skin rashes Lymphatics: ( - ) new lymphadenopathy, ( - ) easy bruising Neurological: ( - ) numbness, ( - ) tingling, ( - ) new weaknesses Behavioral/Psych: ( - ) mood change, ( - ) new changes  All other systems were reviewed with the patient and are negative.   PHYSICAL EXAMINATION: ECOG PERFORMANCE STATUS: 4 - Bedbound  Vitals:   08/11/21 1002  BP: (!) 157/73  Pulse: 91  Resp: (!) 21  Temp: (!) 97 F (36.1 C)  SpO2: 96%    Filed Weights     GENERAL: Elderly African American female in NAD.  SKIN: skin color, texture, turgor are normal, no rashes or significant lesions. Sacrum not assessed.  EYES: conjunctiva are pink and non-injected, sclera clear LUNGS: clear to auscultation and percussion with normal breathing effort HEART: regular rate & rhythm and no murmurs. Mild lower extremity edema ABDOMEN: soft, non-tender, non-distended, normal bowel sounds  LABORATORY DATA:  I have reviewed the data as listed CBC Latest Ref Rng & Units 07/18/2021 07/17/2021 07/16/2021  WBC 4.0 - 10.5 K/uL 6.4 10.1 11.6(H)  Hemoglobin 12.0 - 15.0 g/dL 83.3 10.7(L) 7.4(L)  Hematocrit 36.0 - 46.0 % 39.2 32.8(L) 23.8(L)  Platelets 150 - 400 K/uL 158 86(L) 157    CMP Latest Ref Rng & Units 07/18/2021 07/17/2021 07/17/2021  Glucose 70 - 99 mg/dL 82 91 87  BUN 8 - 23 mg/dL 20 19 82(N)  Creatinine 0.44 - 1.00 mg/dL 0.53 9.76 7.34  Sodium 135 - 145 mmol/L 140 137 140  Potassium 3.5 - 5.1 mmol/L 3.8 3.8 6.2(H)  Chloride 98 - 111 mmol/L 110 115(H) 115(H)  CO2 22 - 32 mmol/L 19(L) 18(L) 18(L)  Calcium 8.9 - 10.3 mg/dL 8.1(L) 7.9(L) 7.6(L)  Total Protein 6.5 - 8.1 g/dL - - -  Total  Bilirubin 0.3 - 1.2 mg/dL - - -  Alkaline Phos 38 - 126 U/L - - -  AST 15 - 41  U/L - - -  ALT 0 - 44 U/L - - -   ASSESSMENT & PLAN [name] is a 87 y.o. female presenting to the clinic for evaluation for macrocytic anemia.   #Macrocytic anemia, multifactorial: --Secondary to rheumatoid arthritis, previously on methotrexate. Currently receiving Humira q 14 days.  --Evidence of folate deficiency from labs on  07/15/2021. Currently on folic acid supplementation.  --Most recent CBC from 07/18/2021 shows Hgb and MCV back to normal.  --Due to difficulty with peripheral access during the visit, unable to obtain adequate blood sample for complete laboratory evaluation. Iron panel does not indicate deficiency. CRP has improved to 1.0. Folate level has improved to 8.2.  --Provided lab orders to check CBC with differential, Sed Rate, homocysteine and retic panel.  --We will keep patient pending for future appts unless requested labs require intervention. Likely will only provide supportive measures due to patient's poor performance status.    Orders Placed This Encounter  Procedures   CBC with Differential (Cancer Center Only)    Standing Status:   Future    Standing Expiration Date:   08/11/2022   Iron and TIBC    Standing Status:   Future    Standing Expiration Date:   08/11/2022   Retic Panel    Standing Status:   Future    Standing Expiration Date:   08/11/2022   Ferritin    Standing Status:   Future    Standing Expiration Date:   08/11/2022   Sedimentation rate    Standing Status:   Future    Standing Expiration Date:   08/11/2022   Folate RBC    Standing Status:   Future    Standing Expiration Date:   08/11/2022   Homocysteine, serum    Standing Status:   Future    Standing Expiration Date:   08/11/2022   C-reactive protein    Standing Status:   Future    Standing Expiration Date:   08/11/2022     All questions were answered. The patient knows to call the clinic with any problems, questions or concerns.  I have spent a total of 30 minutes minutes of  face-to-face and non-face-to-face time, preparing to see the patient, obtaining and/or reviewing separately obtained history, performing a medically appropriate examination, counseling and educating the patient/family, ordering tests,  documenting clinical information in the electronic health record and care coordination.   Georga Kaufmann, PA-C Department of Hematology/Oncology [facility] Phone: [phone]

## 2021-08-17 ENCOUNTER — Telehealth: Payer: Self-pay | Admitting: Physician Assistant

## 2021-08-17 NOTE — Telephone Encounter (Signed)
I called and spoke to patient's daughter, Darlina Sicilian. I reviewed the labs obtained at our clinic on 08/11/2021 and labs from [facility] on 08/14/2021.   There is evidence of macrocytic anemia with Hgb of 9.0 and MCV 96.2. There is no evidence of iron and folate deficiency. Ferritin level and sed rate was elevated which is suggestive of an inflammatory process. Patient has rheumatoid arthritis, currently on Humera q 2 weeks. Additionally, she has a chronic sacral pressure ulcer. Discussed that the anemia could be coming from RA and the chronic wound. Bone marrow disorder is still in the differential and discussed risks versus benefits of undergoing a biopsy with patient's current performance status. Decision was made with patient's daughter that patient will not to proceed with a bone marrow biopsy at this time and only provide supportive measures.   We will schedule a telehealth visit in 3 months and request repeat labs (CBC w/ diff, sed rate, CRP levels) to be drawn at rehab facility a week prior.   Patient's daughter expressed understanding of the plan provided.

## 2021-10-17 ENCOUNTER — Telehealth: Payer: Self-pay

## 2021-10-17 NOTE — Telephone Encounter (Addendum)
Lab orders faxed to [facility] [phone]  Attn Dorothy Puffer, LPN  ----- Message from Briant Cedar, PA-C sent at 10/16/2021  2:03 PM EST ----- Regarding: Labs needed Ella Bodo,  Can you see if patient's rehab facility [facility]) can draw labs in the next week or so? I need CBC w diff, sedimentation rate and c-reactive protein.   Thanks, Karena Addison

## 2021-10-31 NOTE — Telephone Encounter (Signed)
Reviewed labs. No intervention is needed

## 2021-12-06 ENCOUNTER — Emergency Department (HOSPITAL_COMMUNITY): Payer: Medicare HMO

## 2021-12-06 ENCOUNTER — Inpatient Hospital Stay: Payer: Self-pay

## 2021-12-06 ENCOUNTER — Inpatient Hospital Stay (HOSPITAL_COMMUNITY)
Admission: EM | Admit: 2021-12-06 | Discharge: 2021-12-13 | DRG: 871 | Disposition: E | Payer: Medicare HMO | Attending: Internal Medicine | Admitting: Internal Medicine

## 2021-12-06 ENCOUNTER — Other Ambulatory Visit: Payer: Self-pay

## 2021-12-06 ENCOUNTER — Encounter (HOSPITAL_COMMUNITY): Payer: Self-pay

## 2021-12-06 DIAGNOSIS — D6959 Other secondary thrombocytopenia: Secondary | ICD-10-CM | POA: Diagnosis present

## 2021-12-06 DIAGNOSIS — I10 Essential (primary) hypertension: Secondary | ICD-10-CM | POA: Diagnosis present

## 2021-12-06 DIAGNOSIS — R001 Bradycardia, unspecified: Secondary | ICD-10-CM | POA: Diagnosis not present

## 2021-12-06 DIAGNOSIS — Z888 Allergy status to other drugs, medicaments and biological substances status: Secondary | ICD-10-CM

## 2021-12-06 DIAGNOSIS — M069 Rheumatoid arthritis, unspecified: Secondary | ICD-10-CM | POA: Diagnosis present

## 2021-12-06 DIAGNOSIS — E46 Unspecified protein-calorie malnutrition: Secondary | ICD-10-CM | POA: Diagnosis present

## 2021-12-06 DIAGNOSIS — U071 COVID-19: Secondary | ICD-10-CM | POA: Diagnosis not present

## 2021-12-06 DIAGNOSIS — T17908A Unspecified foreign body in respiratory tract, part unspecified causing other injury, initial encounter: Secondary | ICD-10-CM | POA: Diagnosis not present

## 2021-12-06 DIAGNOSIS — Z8616 Personal history of COVID-19: Secondary | ICD-10-CM | POA: Diagnosis not present

## 2021-12-06 DIAGNOSIS — G40909 Epilepsy, unspecified, not intractable, without status epilepticus: Secondary | ICD-10-CM | POA: Diagnosis present

## 2021-12-06 DIAGNOSIS — K5900 Constipation, unspecified: Secondary | ICD-10-CM | POA: Diagnosis present

## 2021-12-06 DIAGNOSIS — J9600 Acute respiratory failure, unspecified whether with hypoxia or hypercapnia: Secondary | ICD-10-CM | POA: Diagnosis not present

## 2021-12-06 DIAGNOSIS — D696 Thrombocytopenia, unspecified: Secondary | ICD-10-CM | POA: Diagnosis present

## 2021-12-06 DIAGNOSIS — F0393 Unspecified dementia, unspecified severity, with mood disturbance: Secondary | ICD-10-CM | POA: Diagnosis present

## 2021-12-06 DIAGNOSIS — I878 Other specified disorders of veins: Secondary | ICD-10-CM | POA: Diagnosis present

## 2021-12-06 DIAGNOSIS — Z8719 Personal history of other diseases of the digestive system: Secondary | ICD-10-CM

## 2021-12-06 DIAGNOSIS — G8929 Other chronic pain: Secondary | ICD-10-CM | POA: Diagnosis present

## 2021-12-06 DIAGNOSIS — D84821 Immunodeficiency due to drugs: Secondary | ICD-10-CM | POA: Diagnosis present

## 2021-12-06 DIAGNOSIS — R627 Adult failure to thrive: Secondary | ICD-10-CM | POA: Diagnosis present

## 2021-12-06 DIAGNOSIS — D509 Iron deficiency anemia, unspecified: Secondary | ICD-10-CM | POA: Diagnosis present

## 2021-12-06 DIAGNOSIS — A4159 Other Gram-negative sepsis: Secondary | ICD-10-CM | POA: Diagnosis present

## 2021-12-06 DIAGNOSIS — E860 Dehydration: Secondary | ICD-10-CM | POA: Diagnosis present

## 2021-12-06 DIAGNOSIS — G9341 Metabolic encephalopathy: Secondary | ICD-10-CM | POA: Diagnosis present

## 2021-12-06 DIAGNOSIS — K219 Gastro-esophageal reflux disease without esophagitis: Secondary | ICD-10-CM | POA: Diagnosis present

## 2021-12-06 DIAGNOSIS — E785 Hyperlipidemia, unspecified: Secondary | ICD-10-CM | POA: Diagnosis not present

## 2021-12-06 DIAGNOSIS — Z66 Do not resuscitate: Secondary | ICD-10-CM | POA: Diagnosis present

## 2021-12-06 DIAGNOSIS — E162 Hypoglycemia, unspecified: Secondary | ICD-10-CM | POA: Diagnosis present

## 2021-12-06 DIAGNOSIS — Z7189 Other specified counseling: Secondary | ICD-10-CM | POA: Diagnosis not present

## 2021-12-06 DIAGNOSIS — J690 Pneumonitis due to inhalation of food and vomit: Secondary | ICD-10-CM | POA: Diagnosis present

## 2021-12-06 DIAGNOSIS — N179 Acute kidney failure, unspecified: Secondary | ICD-10-CM | POA: Diagnosis present

## 2021-12-06 DIAGNOSIS — R402431 Glasgow coma scale score 3-8, in the field [EMT or ambulance]: Secondary | ICD-10-CM | POA: Diagnosis not present

## 2021-12-06 DIAGNOSIS — Z880 Allergy status to penicillin: Secondary | ICD-10-CM

## 2021-12-06 DIAGNOSIS — F32A Depression, unspecified: Secondary | ICD-10-CM | POA: Diagnosis present

## 2021-12-06 DIAGNOSIS — N182 Chronic kidney disease, stage 2 (mild): Secondary | ICD-10-CM | POA: Diagnosis present

## 2021-12-06 DIAGNOSIS — E875 Hyperkalemia: Secondary | ICD-10-CM | POA: Diagnosis present

## 2021-12-06 DIAGNOSIS — E1165 Type 2 diabetes mellitus with hyperglycemia: Secondary | ICD-10-CM | POA: Diagnosis not present

## 2021-12-06 DIAGNOSIS — N390 Urinary tract infection, site not specified: Secondary | ICD-10-CM | POA: Diagnosis present

## 2021-12-06 DIAGNOSIS — M109 Gout, unspecified: Secondary | ICD-10-CM | POA: Diagnosis present

## 2021-12-06 DIAGNOSIS — E872 Acidosis, unspecified: Secondary | ICD-10-CM | POA: Diagnosis present

## 2021-12-06 DIAGNOSIS — I5032 Chronic diastolic (congestive) heart failure: Secondary | ICD-10-CM | POA: Diagnosis present

## 2021-12-06 DIAGNOSIS — Z86718 Personal history of other venous thrombosis and embolism: Secondary | ICD-10-CM

## 2021-12-06 DIAGNOSIS — L89152 Pressure ulcer of sacral region, stage 2: Secondary | ICD-10-CM | POA: Diagnosis present

## 2021-12-06 DIAGNOSIS — R6520 Severe sepsis without septic shock: Secondary | ICD-10-CM | POA: Diagnosis present

## 2021-12-06 DIAGNOSIS — I2510 Atherosclerotic heart disease of native coronary artery without angina pectoris: Secondary | ICD-10-CM | POA: Diagnosis present

## 2021-12-06 DIAGNOSIS — E870 Hyperosmolality and hypernatremia: Secondary | ICD-10-CM | POA: Diagnosis present

## 2021-12-06 DIAGNOSIS — Z90710 Acquired absence of both cervix and uterus: Secondary | ICD-10-CM

## 2021-12-06 DIAGNOSIS — Z833 Family history of diabetes mellitus: Secondary | ICD-10-CM

## 2021-12-06 DIAGNOSIS — R569 Unspecified convulsions: Secondary | ICD-10-CM

## 2021-12-06 DIAGNOSIS — E11649 Type 2 diabetes mellitus with hypoglycemia without coma: Secondary | ICD-10-CM | POA: Diagnosis present

## 2021-12-06 DIAGNOSIS — Z20822 Contact with and (suspected) exposure to covid-19: Secondary | ICD-10-CM | POA: Diagnosis present

## 2021-12-06 DIAGNOSIS — E1122 Type 2 diabetes mellitus with diabetic chronic kidney disease: Secondary | ICD-10-CM | POA: Diagnosis present

## 2021-12-06 DIAGNOSIS — E1121 Type 2 diabetes mellitus with diabetic nephropathy: Secondary | ICD-10-CM | POA: Diagnosis present

## 2021-12-06 DIAGNOSIS — M810 Age-related osteoporosis without current pathological fracture: Secondary | ICD-10-CM | POA: Diagnosis present

## 2021-12-06 DIAGNOSIS — E538 Deficiency of other specified B group vitamins: Secondary | ICD-10-CM | POA: Diagnosis present

## 2021-12-06 DIAGNOSIS — E8809 Other disorders of plasma-protein metabolism, not elsewhere classified: Secondary | ICD-10-CM | POA: Diagnosis present

## 2021-12-06 DIAGNOSIS — Z7401 Bed confinement status: Secondary | ICD-10-CM

## 2021-12-06 DIAGNOSIS — A419 Sepsis, unspecified organism: Secondary | ICD-10-CM | POA: Diagnosis present

## 2021-12-06 DIAGNOSIS — Z8249 Family history of ischemic heart disease and other diseases of the circulatory system: Secondary | ICD-10-CM

## 2021-12-06 DIAGNOSIS — I130 Hypertensive heart and chronic kidney disease with heart failure and stage 1 through stage 4 chronic kidney disease, or unspecified chronic kidney disease: Secondary | ICD-10-CM | POA: Diagnosis present

## 2021-12-06 DIAGNOSIS — Z885 Allergy status to narcotic agent status: Secondary | ICD-10-CM

## 2021-12-06 DIAGNOSIS — Z515 Encounter for palliative care: Secondary | ICD-10-CM

## 2021-12-06 DIAGNOSIS — M130 Polyarthritis, unspecified: Secondary | ICD-10-CM | POA: Diagnosis present

## 2021-12-06 DIAGNOSIS — Z79620 Long term (current) use of immunosuppressive biologic: Secondary | ICD-10-CM

## 2021-12-06 DIAGNOSIS — Z8673 Personal history of transient ischemic attack (TIA), and cerebral infarction without residual deficits: Secondary | ICD-10-CM

## 2021-12-06 DIAGNOSIS — R600 Localized edema: Secondary | ICD-10-CM | POA: Diagnosis present

## 2021-12-06 DIAGNOSIS — Z6822 Body mass index (BMI) 22.0-22.9, adult: Secondary | ICD-10-CM

## 2021-12-06 DIAGNOSIS — B961 Klebsiella pneumoniae [K. pneumoniae] as the cause of diseases classified elsewhere: Secondary | ICD-10-CM | POA: Diagnosis present

## 2021-12-06 DIAGNOSIS — Z955 Presence of coronary angioplasty implant and graft: Secondary | ICD-10-CM

## 2021-12-06 DIAGNOSIS — E559 Vitamin D deficiency, unspecified: Secondary | ICD-10-CM | POA: Diagnosis present

## 2021-12-06 LAB — LACTIC ACID, PLASMA
Lactic Acid, Venous: 2.8 mmol/L (ref 0.5–1.9)
Lactic Acid, Venous: 3.5 mmol/L (ref 0.5–1.9)
Lactic Acid, Venous: 4.1 mmol/L (ref 0.5–1.9)

## 2021-12-06 LAB — CBC WITH DIFFERENTIAL/PLATELET
Abs Immature Granulocytes: 0.86 10*3/uL — ABNORMAL HIGH (ref 0.00–0.07)
Basophils Absolute: 0 10*3/uL (ref 0.0–0.1)
Basophils Relative: 0 %
Eosinophils Absolute: 0.1 10*3/uL (ref 0.0–0.5)
Eosinophils Relative: 1 %
HCT: 43.5 % (ref 36.0–46.0)
Hemoglobin: 13.3 g/dL (ref 12.0–15.0)
Immature Granulocytes: 6 %
Lymphocytes Relative: 25 %
Lymphs Abs: 3.6 10*3/uL (ref 0.7–4.0)
MCH: 32.3 pg (ref 26.0–34.0)
MCHC: 30.6 g/dL (ref 30.0–36.0)
MCV: 105.6 fL — ABNORMAL HIGH (ref 80.0–100.0)
Monocytes Absolute: 1.4 10*3/uL — ABNORMAL HIGH (ref 0.1–1.0)
Monocytes Relative: 10 %
Neutro Abs: 8.1 10*3/uL — ABNORMAL HIGH (ref 1.7–7.7)
Neutrophils Relative %: 58 %
Platelets: 109 10*3/uL — ABNORMAL LOW (ref 150–400)
RBC: 4.12 MIL/uL (ref 3.87–5.11)
RDW: 14.7 % (ref 11.5–15.5)
WBC: 14.1 10*3/uL — ABNORMAL HIGH (ref 4.0–10.5)
nRBC: 8 % — ABNORMAL HIGH (ref 0.0–0.2)

## 2021-12-06 LAB — URINALYSIS, ROUTINE W REFLEX MICROSCOPIC
Bilirubin Urine: NEGATIVE
Glucose, UA: NEGATIVE mg/dL
Hgb urine dipstick: NEGATIVE
Ketones, ur: 5 mg/dL — AB
Nitrite: NEGATIVE
Protein, ur: NEGATIVE mg/dL
Specific Gravity, Urine: 1.018 (ref 1.005–1.030)
WBC, UA: >50 WBC/hpf — ABNORMAL HIGH (ref 0–5)
pH: 5 (ref 5.0–8.0)

## 2021-12-06 LAB — COMPREHENSIVE METABOLIC PANEL
ALT: 17 U/L (ref 0–44)
AST: 60 U/L — ABNORMAL HIGH (ref 15–41)
Albumin: 2.2 g/dL — ABNORMAL LOW (ref 3.5–5.0)
Alkaline Phosphatase: 100 U/L (ref 38–126)
Anion gap: 12 (ref 5–15)
BUN: 67 mg/dL — ABNORMAL HIGH (ref 8–23)
CO2: 24 mmol/L (ref 22–32)
Calcium: 9.1 mg/dL (ref 8.9–10.3)
Chloride: 123 mmol/L — ABNORMAL HIGH (ref 98–111)
Creatinine, Ser: 1.53 mg/dL — ABNORMAL HIGH (ref 0.44–1.00)
GFR, Estimated: 33 mL/min — ABNORMAL LOW (ref >60)
Glucose, Bld: 62 mg/dL — ABNORMAL LOW (ref 70–99)
Potassium: 5.7 mmol/L — ABNORMAL HIGH (ref 3.5–5.1)
Sodium: 159 mmol/L — ABNORMAL HIGH (ref 135–145)
Total Bilirubin: 1.3 mg/dL — ABNORMAL HIGH (ref 0.3–1.2)
Total Protein: 7.9 g/dL (ref 6.5–8.1)

## 2021-12-06 LAB — BASIC METABOLIC PANEL
Anion gap: 10 (ref 5–15)
Anion gap: 11 (ref 5–15)
BUN: 72 mg/dL — ABNORMAL HIGH (ref 8–23)
BUN: 68 mg/dL — ABNORMAL HIGH (ref 8–23)
CO2: 23 mmol/L (ref 22–32)
CO2: 24 mmol/L (ref 22–32)
Calcium: 9.2 mg/dL (ref 8.9–10.3)
Calcium: 8.9 mg/dL (ref 8.9–10.3)
Chloride: 123 mmol/L — ABNORMAL HIGH (ref 98–111)
Chloride: 123 mmol/L — ABNORMAL HIGH (ref 98–111)
Creatinine, Ser: 1.66 mg/dL — ABNORMAL HIGH (ref 0.44–1.00)
Creatinine, Ser: 1.73 mg/dL — ABNORMAL HIGH (ref 0.44–1.00)
GFR, Estimated: 30 mL/min — ABNORMAL LOW (ref >60)
GFR, Estimated: 28 mL/min — ABNORMAL LOW (ref >60)
Glucose, Bld: 172 mg/dL — ABNORMAL HIGH (ref 70–99)
Glucose, Bld: 98 mg/dL (ref 70–99)
Potassium: 4 mmol/L (ref 3.5–5.1)
Potassium: 3.5 mmol/L (ref 3.5–5.1)
Sodium: 156 mmol/L — ABNORMAL HIGH (ref 135–145)
Sodium: 158 mmol/L — ABNORMAL HIGH (ref 135–145)

## 2021-12-06 LAB — CBG MONITORING, ED
Glucose-Capillary: 35 mg/dL — CL (ref 70–99)
Glucose-Capillary: 35 mg/dL — CL (ref 70–99)
Glucose-Capillary: 112 mg/dL — ABNORMAL HIGH (ref 70–99)
Glucose-Capillary: 56 mg/dL — ABNORMAL LOW (ref 70–99)
Glucose-Capillary: 93 mg/dL (ref 70–99)
Glucose-Capillary: 111 mg/dL — ABNORMAL HIGH (ref 70–99)
Glucose-Capillary: 111 mg/dL — ABNORMAL HIGH (ref 70–99)
Glucose-Capillary: 88 mg/dL (ref 70–99)

## 2021-12-06 LAB — STREP PNEUMONIAE URINARY ANTIGEN: Strep Pneumo Urinary Antigen: NEGATIVE

## 2021-12-06 LAB — PROTIME-INR
INR: 1.3 — ABNORMAL HIGH (ref 0.8–1.2)
Prothrombin Time: 15.7 seconds — ABNORMAL HIGH (ref 11.4–15.2)

## 2021-12-06 LAB — MAGNESIUM: Magnesium: 2.7 mg/dL — ABNORMAL HIGH (ref 1.7–2.4)

## 2021-12-06 LAB — RESP PANEL BY RT-PCR (FLU A&B, COVID) ARPGX2
Influenza A by PCR: NEGATIVE
Influenza B by PCR: NEGATIVE
SARS Coronavirus 2 by RT PCR: POSITIVE — AB

## 2021-12-06 MED ORDER — DEXTROSE 50 % IV SOLN
1.0000 | Freq: Once | INTRAVENOUS | Status: AC
Start: 2021-12-06 — End: 2021-12-06

## 2021-12-06 MED ORDER — ONDANSETRON HCL 4 MG/2ML IJ SOLN: 4.0000 mg | Freq: Four times a day (QID) | INTRAMUSCULAR | Status: DC | PRN

## 2021-12-06 MED ORDER — ONDANSETRON HCL 4 MG PO TABS: 4.0000 mg | ORAL_TABLET | Freq: Four times a day (QID) | ORAL | Status: DC | PRN

## 2021-12-06 MED ORDER — ACETAMINOPHEN 325 MG PO TABS: 650.0000 mg | ORAL_TABLET | Freq: Four times a day (QID) | ORAL | Status: DC | PRN

## 2021-12-06 MED ORDER — DEXTROSE 50 % IV SOLN
1.0000 | Freq: Once | INTRAVENOUS | Status: AC
  Administered 2021-12-06: 08:00:00 50 mL via INTRAVENOUS
  Filled 2021-12-06: qty 50

## 2021-12-06 MED ORDER — LACTATED RINGERS IV BOLUS
1000.0000 mL | Freq: Once | INTRAVENOUS | Status: AC
Start: 2021-12-06 — End: 2021-12-06
  Administered 2021-12-06: 13:00:00 1000 mL via INTRAVENOUS

## 2021-12-06 MED ORDER — SODIUM CHLORIDE 0.9 % IV SOLN
3.0000 g | Freq: Once | INTRAVENOUS | Status: AC
  Administered 2021-12-06: 09:00:00 3 g via INTRAVENOUS
  Filled 2021-12-06: qty 8

## 2021-12-06 MED ORDER — SODIUM CHLORIDE 0.9 % IV SOLN
500.0000 mg | INTRAVENOUS | Status: AC
  Administered 2021-12-06 – 2021-12-08 (×3): 500 mg via INTRAVENOUS
  Filled 2021-12-06 (×3): qty 5

## 2021-12-06 MED ORDER — LACTATED RINGERS IV BOLUS
1000.0000 mL | Freq: Once | INTRAVENOUS | Status: AC
  Administered 2021-12-06: 09:00:00 1000 mL via INTRAVENOUS

## 2021-12-06 MED ORDER — DEXTROSE 50 % IV SOLN
INTRAVENOUS | Status: AC
  Administered 2021-12-06: 09:00:00 50 mL via INTRAVENOUS
  Filled 2021-12-06: qty 50

## 2021-12-06 MED ORDER — LACTATED RINGERS IV SOLN: INTRAVENOUS | Status: DC

## 2021-12-06 MED ORDER — ENOXAPARIN SODIUM 30 MG/0.3ML IJ SOSY
30.0000 mg | PREFILLED_SYRINGE | Freq: Every day | INTRAMUSCULAR | Status: DC
  Administered 2021-12-06 – 2021-12-07 (×2): 30 mg via SUBCUTANEOUS
  Filled 2021-12-06: qty 0.3

## 2021-12-06 MED ORDER — DEXTROSE 5 % IV SOLN: INTRAVENOUS | Status: DC

## 2021-12-06 MED ORDER — DEXTROSE 10 % IV SOLN: INTRAVENOUS | Status: DC

## 2021-12-06 MED ORDER — LACTATED RINGERS IV BOLUS (SEPSIS)
1000.0000 mL | Freq: Once | INTRAVENOUS | Status: DC
  Administered 2021-12-06: 09:00:00 1000 mL via INTRAVENOUS

## 2021-12-06 MED ORDER — ACETAMINOPHEN 650 MG RE SUPP: 650.0000 mg | Freq: Four times a day (QID) | RECTAL | Status: DC | PRN

## 2021-12-06 MED ORDER — SODIUM CHLORIDE 0.9 % IV SOLN
1.0000 g | INTRAVENOUS | Status: AC
  Administered 2021-12-07 – 2021-12-10 (×4): 1 g via INTRAVENOUS
  Filled 2021-12-06 (×5): qty 10

## 2021-12-06 MED ORDER — DEXTROSE-NACL 10-0.45 % IV SOLN
INTRAVENOUS | Status: DC
  Filled 2021-12-06: qty 1000

## 2021-12-06 NOTE — ED Triage Notes (Signed)
Pt BIB EMS for a choking call. Per staff her blood sugar was 53 sot hey gave her oral glucose, orange juice and glucagon IM. Per EMS they believe she may have aspirated because she's making "raddling" sounds. Her norm is confused and per staff her "she's had a mental decline over the last week". She's AxO to pain.  122/55 Sinus tach 101 Blood sugar 75

## 2021-12-06 NOTE — Progress Notes (Signed)
[facility], [facility]  This is a pending hospice patient. Please notify [facility] prior to discharge so that we may schedule and admission visit. Please call with any questions or concerns.   Thank you.    Lynder ParentsShanita Wicker, LCSW [facility] Liaison  [phone]

## 2021-12-06 NOTE — Sepsis Progress Note (Addendum)
Notified bedside nurse of need to draw repeat lactic acid. Bedside RN reminded of the end time of the 6 hour bundle being 1447 and to please draw this ahead of this time.

## 2021-12-06 NOTE — ED Notes (Signed)
Multiple IV attempts, unsuccessful at this time.  °

## 2021-12-06 NOTE — ED Provider Notes (Signed)
[name] DEPT Provider Note   CSN: [id number] Arrival date & time: 12/03/2021  0703     History  Chief Complaint  Patient presents with   Choking   Altered Mental Status    [name] is a 88 y.o. female.  HPI Patient presents for hypoglycemia and altered mental status.  Past medical history is notable for T2DM, anemia, anxiety, GERD, history of DVT, history of pancreatitis, HTN, malnutrition, TIA, seizures, CHF, CKD, depression.  She currently resides in a SNF.  She has had worsening generalized weakness and declining mentation over the past week.  Her daughter reports that she has not been opening her eyes to command for several days.  Today, she had clinical worsening and EMS was called.  Staff at the facility found her blood sugar to be low in the 60s.  They attempted to give her p.o. glucose.  EMS reports that she had a normal blood pressure and heart rate.  SPO2 was difficult to ascertain due to poorly perfused extremities.  They did place her on a nonrebreather.  Patient has been moaning incomprehensibly and not following commands.  She does arrive with a MOST form that states that she would not want to be intubated.  I did confirm this with her daughter over the telephone.  Patient's daughter is currently on her way to the [facility].    Home Medications Prior to Admission medications   Medication Sig Start Date End Date Taking? Authorizing Provider  acetaminophen (TYLENOL) 500 MG tablet Take 2 tablets (1,000 mg total) by mouth every 8 (eight) hours. 07/18/21   Almon Hercules, MD  Amino Acids-Protein Hydrolys (PRO-STAT AWC) LIQD Take 30 mLs by mouth 2 (two) times daily.    [provider]  amLODipine (NORVASC) 5 MG tablet Take 1 tablet (5 mg total) by mouth daily. Patient not taking: Reported on 07/14/2021 07/03/21   [place], Eric J, DO  B Complex-C (B-COMPLEX WITH VITAMIN C) tablet Take 1 tablet by mouth daily.    [provider]   carvedilol (COREG) 3.125 MG tablet Take 1 tablet (3.125 mg total) by mouth 2 (two) times daily with a meal. 05/11/21   Elgergawy, Leana Roe, MD  cholecalciferol (VITAMIN D) 25 MCG (1000 UNIT) tablet Take 1,000 Units by mouth daily.    [provider]  colchicine 0.6 MG tablet Take 1 tablet (0.6 mg total) by mouth daily. 07/19/21   Almon Hercules, MD  diclofenac Sodium (VOLTAREN) 1 % GEL Apply 1 application topically 2 (two) times daily. Back pain    [provider]  divalproex (DEPAKOTE) 125 MG DR tablet Take 6 tablets (750 mg total) by mouth 2 (two) times daily. Patient taking differently: Take 750 mg by mouth 2 (two) times daily. 6 tablets = 750 mg twice daily 07/03/21   [place], Alvira Philips, DO  DULoxetine (CYMBALTA) 20 MG capsule Take 20 mg by mouth daily. 06/15/21   [provider]  feeding supplement, GLUCERNA SHAKE, (GLUCERNA SHAKE) LIQD Take 237 mLs by mouth 3 (three) times daily between meals. Patient not taking: Reported on 06/29/2021 02/09/20   Leroy Sea, MD  ferrous sulfate 325 (65 FE) MG tablet Take 1 tablet (325 mg total) by mouth daily. 03/21/21   Charlynne Pander, MD  folic acid (FOLVITE) 1 MG tablet Take 1 tablet (1 mg total) by mouth daily. 07/19/21   Almon Hercules, MD  HUMIRA PEN 40 MG/0.4ML PNKT Inject 40 mg into the skin every  14 (fourteen) days. On the 4th and 18th of the month 06/22/21   [provider]  ondansetron (ZOFRAN-ODT) 4 MG disintegrating tablet Take 4 mg by mouth every 6 (six) hours as needed for nausea. 07/12/21   [provider]  pantoprazole (PROTONIX) 40 MG tablet Take 1 tablet (40 mg total) by mouth daily. 02/09/20   Leroy Sea, MD  polyethylene glycol (MIRALAX / GLYCOLAX) 17 g packet Take 17 g by mouth See admin instructions. 17g orally on Sunday, Monday, Wednesday, Friday, Saturday    [provider]  senna-docusate (SENOKOT-S) 8.6-50 MG tablet Take 2 tablets by mouth 2 (two) times daily.    [provider]  thiamine 100 MG tablet Take 1 tablet (100 mg total) by mouth daily. 01/14/21   Ghimire, Werner Lean, MD      Allergies    Ativan [lorazepam], Tramadol hcl, Amlodipine besylate, Atenolol, Benazepril, Benicar [olmesartan medoxomil], Cozaar, Hydralazine, Hydrochlorothiazide, Hydrochlorothiazide w-triamterene, Hydrocodone, Hydroxyzine pamoate, Iodine, Lisinopril, Peach flavor, Penicillins, Pravastatin, Prednisolone, Strawberry flavor, Tramadol hcl, and Benadryl [diphenhydramine]    Review of Systems   Review of Systems  Unable to perform ROS: Mental status change   Physical Exam Updated Vital Signs BP 121/71    Pulse 82    Temp 98 F (36.7 C) (Axillary)    Resp 20    Ht 5\' 5"  (1.651 m)    Wt 62.2 kg    SpO2 100%    BMI 22.82 kg/m  Physical Exam Constitutional:      General: She is not in acute distress.    Appearance: She is well-developed and normal weight. She is ill-appearing. She is not toxic-appearing or diaphoretic.     Interventions: Face mask in place.  HENT:     Head: Normocephalic and atraumatic.     Right Ear: External ear normal.     Left Ear: External ear normal.     Nose: Nose normal.     Mouth/Throat:     Pharynx: Oropharynx is clear.  Eyes:     General: No scleral icterus. Cardiovascular:     Rate and Rhythm: Normal rate and regular rhythm.  Pulmonary:     Effort: Tachypnea present. No accessory muscle usage, respiratory distress or retractions.     Breath sounds: Transmitted upper airway sounds present. No stridor. Rhonchi and rales present. No wheezing.  Abdominal:     General: There is no distension.     Palpations: Abdomen is soft.     Tenderness: There is no abdominal tenderness.  Musculoskeletal:        General: No tenderness or deformity.     Cervical back: Neck supple.  Skin:    General: Skin is warm and dry.  Neurological:     Mental Status: She is lethargic.     GCS: GCS eye subscore is 1. GCS verbal subscore is 2. GCS motor subscore is  5.    ED Results / Procedures / Treatments   Labs (all labs ordered are listed, but only abnormal results are displayed) Labs Reviewed  RESP PANEL BY RT-PCR (FLU A&B, COVID) ARPGX2 - Abnormal; Notable for the following components:      Result Value   SARS Coronavirus 2 by RT PCR POSITIVE (*)    All other components within normal limits  COMPREHENSIVE METABOLIC PANEL - Abnormal; Notable for the following components:   Sodium 159 (*)    Potassium 5.7 (*)    Chloride 123 (*)    Glucose, Bld 62 (*)  BUN 67 (*)    Creatinine, Ser 1.53 (*)    Albumin 2.2 (*)    AST 60 (*)    Total Bilirubin 1.3 (*)    GFR, Estimated 33 (*)    All other components within normal limits  CBC WITH DIFFERENTIAL/PLATELET - Abnormal; Notable for the following components:   WBC 14.1 (*)    MCV 105.6 (*)    Platelets 109 (*)    nRBC 8.0 (*)    Neutro Abs 8.1 (*)    Monocytes Absolute 1.4 (*)    Abs Immature Granulocytes 0.86 (*)    All other components within normal limits  URINALYSIS, ROUTINE W REFLEX MICROSCOPIC - Abnormal; Notable for the following components:   Color, Urine AMBER (*)    APPearance HAZY (*)    Ketones, ur 5 (*)    Leukocytes,Ua LARGE (*)    WBC, UA >50 (*)    Bacteria, UA MANY (*)    All other components within normal limits  MAGNESIUM - Abnormal; Notable for the following components:   Magnesium 2.7 (*)    All other components within normal limits  LACTIC ACID, PLASMA - Abnormal; Notable for the following components:   Lactic Acid, Venous 2.8 (*)    All other components within normal limits  PROTIME-INR - Abnormal; Notable for the following components:   Prothrombin Time 15.7 (*)    INR 1.3 (*)    All other components within normal limits  BASIC METABOLIC PANEL - Abnormal; Notable for the following components:   Sodium 156 (*)    Chloride 123 (*)    Glucose, Bld 172 (*)    BUN 72 (*)    Creatinine, Ser 1.66 (*)    GFR, Estimated 30 (*)    All other components within  normal limits  LACTIC ACID, PLASMA - Abnormal; Notable for the following components:   Lactic Acid, Venous 3.5 (*)    All other components within normal limits  CBG MONITORING, ED - Abnormal; Notable for the following components:   Glucose-Capillary 35 (*)    All other components within normal limits  CBG MONITORING, ED - Abnormal; Notable for the following components:   Glucose-Capillary 35 (*)    All other components within normal limits  CBG MONITORING, ED - Abnormal; Notable for the following components:   Glucose-Capillary 56 (*)    All other components within normal limits  CBG MONITORING, ED - Abnormal; Notable for the following components:   Glucose-Capillary 112 (*)    All other components within normal limits  CBG MONITORING, ED - Abnormal; Notable for the following components:   Glucose-Capillary 111 (*)    All other components within normal limits  CBG MONITORING, ED - Abnormal; Notable for the following components:   Glucose-Capillary 111 (*)    All other components within normal limits  CULTURE, BLOOD (ROUTINE X 2)  CULTURE, BLOOD (ROUTINE X 2)  URINE CULTURE  STREP PNEUMONIAE URINARY ANTIGEN  BASIC METABOLIC PANEL  LACTIC ACID, PLASMA  COMPREHENSIVE METABOLIC PANEL  CBC  CBG MONITORING, ED  CBG MONITORING, ED    EKG EKG Interpretation  Date/Time:  Wednesday December 06 2021 07:41:33 EST Ventricular Rate:  97 PR Interval:  142 QRS Duration: 129 QT Interval:  374 QTC Calculation: 476 R Axis:   59 Text Interpretation: Sinus rhythm Right bundle branch block Confirmed by Gloris Manchester ([id number]) on 12/04/2021 8:03:37 AM  Radiology CT HEAD WO CONTRAST  Result Date: 11/13/2021 CLINICAL DATA:  Mental status changes  of unknown cause in a female at age 41. EXAM: CT HEAD WITHOUT CONTRAST TECHNIQUE: Contiguous axial images were obtained from the base of the skull through the vertex without intravenous contrast. RADIATION DOSE REDUCTION: This exam was performed according to  the departmental dose-optimization program which includes automated exposure control, adjustment of the mA and/or kV according to patient size and/or use of iterative reconstruction technique. COMPARISON:  July 13, 2021. FINDINGS: Brain: No evidence of acute infarction, hemorrhage, hydrocephalus, extra-axial collection or mass lesion/mass effect. Signs of atrophy and chronic microvascular ischemic change as before. Vascular: No hyperdense vessel or unexpected calcification. Skull: Normal. Negative for fracture or focal lesion. Sinuses/Orbits: Visualized paranasal sinuses and orbits are unremarkable. Other: None IMPRESSION: 1. No acute intracranial process. 2. Signs of atrophy and chronic microvascular ischemic change as before. Electronically Signed   By: Donzetta Kohut M.D.   On: 11/23/2021 09:05   DG Chest Port 1 View  Result Date: 11/21/2021 CLINICAL DATA:  88 year old female with history of altered mental status. Choking. EXAM: PORTABLE CHEST 1 VIEW COMPARISON:  Chest x-ray 07/13/2021. FINDINGS: Extensive airspace consolidation in the right lower lobe. Additional opacity at the left lung base medially. Small left pleural effusion. No pneumothorax. No evidence of pulmonary edema. Heart size is normal. Upper mediastinal contours are within normal limits. IMPRESSION: 1. The appearance of the chest is concerning for probable aspiration pneumonia given the patient's history of choking, as above. 2. Aortic atherosclerosis. Electronically Signed   By: Trudie Reed M.D.   On: 12/03/2021 07:59   [place] EKG SITE RITE  Result Date: 12/05/2021 If Site Rite image not attached, placement could not be confirmed due to current cardiac rhythm.   Procedures Procedures    Medications Ordered in ED Medications  dextrose 5 % solution (0 mLs Intravenous Paused 11/26/2021 1358)  enoxaparin (LOVENOX) injection 30 mg (30 mg Subcutaneous Given 11/22/2021 1034)  acetaminophen (TYLENOL) tablet 650 mg (has no administration  in time range)    Or  acetaminophen (TYLENOL) suppository 650 mg (has no administration in time range)  ondansetron (ZOFRAN) tablet 4 mg (has no administration in time range)    Or  ondansetron (ZOFRAN) injection 4 mg (has no administration in time range)  azithromycin (ZITHROMAX) 500 mg in sodium chloride 0.9 % 250 mL IVPB (0 mg Intravenous Paused 11/28/2021 1359)  cefTRIAXone (ROCEPHIN) 1 g in sodium chloride 0.9 % 100 mL IVPB (0 g Intravenous Hold 11/26/2021 1357)  dextrose 50 % solution 50 mL (50 mLs Intravenous Given 11/16/2021 0745)  Ampicillin-Sulbactam (UNASYN) 3 g in sodium chloride 0.9 % 100 mL IVPB (0 g Intravenous Stopped 11/19/2021 0923)  dextrose 50 % solution 50 mL (50 mLs Intravenous Given 11/30/2021 0837)  lactated ringers bolus 1,000 mL (0 mLs Intravenous Stopped 11/24/2021 0918)  lactated ringers bolus 1,000 mL (1,000 mLs Intravenous Bolus 12/09/2021 1321)    ED Course/ Medical Decision Making/ A&P                           Medical Decision Making Amount and/or Complexity of Data Reviewed Labs: ordered. Radiology: ordered.  Risk Prescription drug management. Decision regarding hospitalization.   This patient presents to the ED for concern of altered mental status, this involves an extensive number of treatment options, and is a complaint that carries with it a high risk of complications and morbidity.  The differential diagnosis includes infection, CVA, ICH, polypharmacy, hypoglycemia, metabolic abnormalities   Co morbidities that complicate the  patient evaluation  T2DM, anemia, anxiety, GERD, history of DVT, history of pancreatitis, HTN, malnutrition, TIA, seizures, CHF, CKD, depression   Additional history obtained:  Additional history obtained from EMS, patient's daughter External records from outside source obtained and reviewed including EMR   Lab Tests:  I Ordered, and personally interpreted labs.  The pertinent results include: Leukocytosis, hyperglycemia, AKI,  hypernatremia, hyperkalemia, hypermagnesemia, lactic acidosis   Imaging Studies ordered:  I ordered imaging studies including chest x-ray, CT head I independently visualized and interpreted imaging which showed chest x-ray consistent with aspiration pneumonia, CT head without acute findings. I agree with the radiologist interpretation   Cardiac Monitoring:  The patient was maintained on a cardiac monitor.  I personally viewed and interpreted the cardiac monitored which showed an underlying rhythm of: Sinus rhythm   Medicines ordered and prescription drug management:  I ordered medication including D50 and D10 infusion for hypoglycemia, IV fluids and Unasyn for sepsis from aspiration pneumonia Reevaluation of the patient after these medicines showed that the patient improved I have reviewed the patients home medicines and have made adjustments as needed   Problem List / ED Course:  Patient is 88 year old female with dementia, presenting from a skilled nursing facility for altered mental status.  There was also report of recent choking with swallowing, cough, increased work of breathing.  On arrival in the ED, patient is a GCS of 8.  She is maintaining a patent airway at this time.  She arrives on a nonrebreather, however, EMS was unable to obtain SPO2 readings.  This is likely secondary to poor perfusion to her extremities.  Patient arrives with a MOST form that specifically states no intubation.  I did contact her daughter by telephone upon the patient's arrival in the ED to confirm DNR/DNI CODE STATUS.  Patient's daughter stated that this is correct and that she would be on her way to join her mother at bedside.  Patient was placed on bedside cardiac monitor.  She was found to be normotensive and SPO2 of 100% was able to be maintained on NRB.  There was report of hypoglycemia on scene.  Staff at facility attempted to give her p.o. glucose.  I suspect that this did not go well given her  current mental status.  Blood sugar on arrival in the ED is in the range of 35.  IV access was obtained under ultrasound guidance by myself.  Following this, patient was given dextrose containing IV fluids.  Work-up was initiated which was notable for evidence of aspiration pneumonia on chest x-ray.  IV fluids and Unasyn were given.  Remaining lab work showed concern of sepsis with evidence of endorgan damage and lactic acidosis.  Patient's daughter arrived at bedside and was updated on her mother's condition.  COVID-19 test was positive.  Patient's daughter states that her mother had COVID-19 1 month ago.  I suspect that today's positive test is residual viral presents from presents from previous infection.  Although patient's breathing appeared less labored on reassessment, her mental status did not improve.  Given her current mental status, patient would not be a candidate for BiPAP.  Given her CODE STATUS wishes, no intubation is to be performed.  Patient does have a history of CHF.  Gentle hydration was performed in an effort to not cause a pulmonary edema on top of her current pneumonia.  Patient was admitted to the hospitalist for further management.   Reevaluation:  After the interventions noted above, I reevaluated the patient  and found that they have :stayed the same   Social Determinants of Health:  Patient has multiple chronic medical conditions, including dementia.  She is bedbound at baseline.  She presents to the ED today with evidence of severe illness.   Dispostion:  After consideration of the diagnostic results and the patients response to treatment, I feel that the patent would benefit from admission to [facility].   CRITICAL CARE Performed by: Gloris Manchester   Total critical care time: 35 minutes  Critical care time was exclusive of separately billable procedures and treating other patients.  Critical care was necessary to treat or prevent imminent or life-threatening  deterioration.  Critical care was time spent personally by me on the following activities: development of treatment plan with patient and/or surrogate as well as nursing, discussions with consultants, evaluation of patient's response to treatment, examination of patient, obtaining history from patient or surrogate, ordering and performing treatments and interventions, ordering and review of laboratory studies, ordering and review of radiographic studies, pulse oximetry and re-evaluation of patient's condition.          Final Clinical Impression(s) / ED Diagnoses Final diagnoses:  Aspiration pneumonia of right lower lobe, unspecified aspiration pneumonia type (HCC)  Glasgow coma scale total score 3-8, in the field (EMT or ambulance) (HCC)  Sepsis with encephalopathy without septic shock, due to unspecified organism [facility] [place])    Rx / DC Orders ED Discharge Orders     None         Gloris Manchester, MD 2021/12/27 2028

## 2021-12-06 NOTE — ED Notes (Signed)
All antibiotics have been paused due to lost of all IV access. Pt is a difficult stick. Awaiting for PICC line placement at this time.

## 2021-12-06 NOTE — H&P (Signed)
History and Physical    [name] DOB: [birth year] DOA: 12/09/2021  PCP: Cassandria Anger, MD  Patient coming from: SNF.  I have personally briefly reviewed patient's old medical records in [place]  Chief Complaint: Choking episode.  HPI: [name] is a 88 y.o. female with medical history significant of iron deficiency anemia, thoracic aortic aneurysm, anxiety, depression, B12 deficiency, CAD, chronic back pain, constipation, GERD, gout, hypertension, hyperlipidemia, obesity, osteoporosis, history of pancreatitis, polyarthritis, history of seizure disorder, tinnitus, urinary frequency, history of vertigo, vitamin D deficiency who is brought to the emergency department from her facility due to having an aspiration into airway event and progressively worse altered mental status for the past week.  She is currently obtunded and unable to provide further information.  Her daughter stated that her oral intake has decreased significantly over the past few days.  No fever, emesis or diarrhea to the daughter's knowledge.  ED Course: Initial vital signs were temperature Pulse 96, respirations 28, BP 170/74 and O2 sat 100% on NRB mask at 10 LPM.  The patient was given 3 g of Unasyn, dextrose 50% 50 mL IV twice and was started on a dextrose 10% infusion.  Lab work: Urinalysis was hazy with ketonuria 5 mg/dL, large leukocyte esterase, more than 50 WBC and many bacteria on microscopic examination.  Coronavirus PCR was positive but the patient had COVID about a month or so ago.  CBC is her white count of 14.1, hemoglobin 13.3 g/dL with an MCV of 105.6 fL and platelets of 109.  PT 15.7 and INR 1.3.  Lactic acid was 2.8 and then 3.5 mmol/L.  Sodium 159, potassium 5.7, chloride 123 and CO2 24 mmol/L.  Bilirubin 1.3, BUN 67, creatinine 1.53 and glucose 62 mg/dL.  Albumin was 2.2 g/dL and AST 60 units/L.  The rest of the LFTs were unremarkable.  Imaging: Portable 1 view chest  radiograph if concerning for aspiration pneumonia in the right lower lobe.  There is also an additional opacity of the left lung base medially.  There is a small left pleural effusion.  CT head without contrast shows atrophy and chronic microvascular ischemic changes, but no acute intracranial process.  Please see images and full radiology report for further details.  Review of Systems: As per HPI otherwise all other systems reviewed and are negative.  Past Medical History:  Diagnosis Date   Anemia    iron deficiency   Aneurysm, thoracic aortic    Anxiety    B12 deficiency    CAD (coronary artery disease)    s/p stenting of LAD 1999- cath 5-08 EF normal LAD 30-40% restenosis. D1 50% D2 80% LCX & RCA minimal plaque   Chronic back pain    Constipation    Depression    GERD (gastroesophageal reflux disease)    Gout    HTN (hypertension)    Hyperlipemia    Obesity    Osteoporosis    Pancreatitis    Polyarthritis    DJD/ possible PMR   Renal insufficiency    Cr 1.2-1.3   Seizures (HCC)    Tinnitus    Urinary frequency    Vertigo    Vitamin D deficiency     Past Surgical History:  Procedure Laterality Date   ABDOMINAL HYSTERECTOMY     CARDIAC CATHETERIZATION  2008   L main 20%, LAD stent patent, D1 50%, D2 80% (small), RCA 20%, EF 55-60%   CHOLECYSTECTOMY     CORONARY  ANGIOPLASTY WITH STENT PLACEMENT  1999   LAD stent   CORONARY STENT INTERVENTION N/A 10/16/2019   Procedure: CORONARY STENT INTERVENTION;  Surgeon: Leonie Man, MD;  Location: [facility];  Service: Cardiovascular;  Laterality: N/A;   HEMORRHOID SURGERY     LAPAROSCOPIC LYSIS OF ADHESIONS N/A 03/24/2015   Procedure: LAPAROSCOPIC LYSIS OF ADHESIONS;  Surgeon: Johnathan Hausen, MD;  Location: [facility];  Service: General;  Laterality: N/A;   LAPAROSCOPY N/A 03/24/2015   Procedure: LAPAROSCOPY DIAGNOSTIC;  Surgeon: Johnathan Hausen, MD;  Location: [facility];  Service: General;  Laterality: N/A;   LAPAROTOMY N/A  03/24/2015   Procedure: LAPAROTOMY with decompression of bowel;  Surgeon: Johnathan Hausen, MD;  Location: [facility];  Service: General;  Laterality: N/A;   LEFT HEART CATH AND CORONARY ANGIOGRAPHY N/A 10/16/2019   Procedure: LEFT HEART CATH AND CORONARY ANGIOGRAPHY;  Surgeon: Leonie Man, MD;  Location: [facility];  Service: Cardiovascular;  Laterality: N/A;   TUBAL LIGATION      Social History  reports that she has never smoked. She has never used smokeless tobacco. She reports that she does not drink alcohol and does not use drugs.  Allergies  Allergen Reactions   Ativan [Lorazepam] Other (See Comments)    Very confused   Tramadol Hcl Anxiety and Rash    Headache   Amlodipine Besylate Other (See Comments)     dizzy   Atenolol Other (See Comments)    Fatigue   Benazepril Cough   Benicar [Olmesartan Medoxomil] Other (See Comments)    HEADACHE   Cozaar Nausea And Vomiting   Hydralazine Other (See Comments)    Hair loss   Hydrochlorothiazide Other (See Comments)   Hydrochlorothiazide W-Triamterene Other (See Comments)     dizzy   Hydrocodone Other (See Comments)    HEADACHE   Hydroxyzine Pamoate Other (See Comments)    Per MAR   Iodine Other (See Comments)    Per MAR   Lisinopril Other (See Comments) and Cough    Tired & fatigue   Peach Flavor Itching   Penicillins Itching    Has patient had a PCN reaction causing immediate rash, facial/tongue/throat swelling, SOB or lightheadedness with hypotension: No Has patient had a PCN reaction causing severe rash involving mucus membranes or skin necrosis: No Has patient had a PCN reaction that required hospitalization No Has patient had a PCN reaction occurring within the last 10 years: Yes If all of the above answers are "NO", then may proceed with Cephalosporin use.  tolerates cephalosporins OK    Pravastatin Other (See Comments)    Myalgias-muscle pain   Prednisolone Nausea Only and Other (See Comments)    Upset  stomach   Strawberry Flavor Itching   Tramadol Hcl Other (See Comments)    headache   Benadryl [Diphenhydramine] Itching and Palpitations    Family History  Adopted: Yes  Problem Relation Age of Onset   Diabetes Mother    Hypertension Father    Cancer Sister    Prior to Admission medications   Medication Sig Start Date End Date Taking? Authorizing Provider  acetaminophen (TYLENOL) 500 MG tablet Take 2 tablets (1,000 mg total) by mouth every 8 (eight) hours. 07/18/21   Mercy Riding, MD  Amino Acids-Protein Hydrolys (PRO-STAT AWC) LIQD Take 30 mLs by mouth 2 (two) times daily.    [provider]  amLODipine (NORVASC) 5 MG tablet Take 1 tablet (5 mg total) by mouth daily. Patient not taking:  Reported on 07/14/2021 07/03/21   [place] Eric J, DO  B Complex-C (B-COMPLEX WITH VITAMIN C) tablet Take 1 tablet by mouth daily.    [provider]  carvedilol (COREG) 3.125 MG tablet Take 1 tablet (3.125 mg total) by mouth 2 (two) times daily with a meal. 05/11/21   Elgergawy, Silver Huguenin, MD  cholecalciferol (VITAMIN D) 25 MCG (1000 UNIT) tablet Take 1,000 Units by mouth daily.    [provider]  colchicine 0.6 MG tablet Take 1 tablet (0.6 mg total) by mouth daily. 07/19/21   Mercy Riding, MD  diclofenac Sodium (VOLTAREN) 1 % GEL Apply 1 application topically 2 (two) times daily. Back pain    [provider]  divalproex (DEPAKOTE) 125 MG DR tablet Take 6 tablets (750 mg total) by mouth 2 (two) times daily. Patient taking differently: Take 750 mg by mouth 2 (two) times daily. 6 tablets = 750 mg twice daily 07/03/21   [place] Donnamarie Poag, DO  DULoxetine (CYMBALTA) 20 MG capsule Take 20 mg by mouth daily. 06/15/21   [provider]  feeding supplement, GLUCERNA SHAKE, (GLUCERNA SHAKE) LIQD Take 237 mLs by mouth 3 (three) times daily between meals. Patient not taking: Reported on 06/29/2021 02/09/20   Thurnell Lose, MD  ferrous sulfate 325 (65 FE) MG tablet Take 1 tablet  (325 mg total) by mouth daily. 03/21/21   Drenda Freeze, MD  folic acid (FOLVITE) 1 MG tablet Take 1 tablet (1 mg total) by mouth daily. 07/19/21   Mercy Riding, MD  HUMIRA PEN 40 MG/0.4ML PNKT Inject 40 mg into the skin every 14 (fourteen) days. On the 4th and 18th of the month 06/22/21   [provider]  ondansetron (ZOFRAN-ODT) 4 MG disintegrating tablet Take 4 mg by mouth every 6 (six) hours as needed for nausea. 07/12/21   [provider]  pantoprazole (PROTONIX) 40 MG tablet Take 1 tablet (40 mg total) by mouth daily. 02/09/20   Thurnell Lose, MD  polyethylene glycol (MIRALAX / GLYCOLAX) 17 g packet Take 17 g by mouth See admin instructions. 17g orally on Sunday, Monday, Wednesday, Friday, Saturday    [provider]  senna-docusate (SENOKOT-S) 8.6-50 MG tablet Take 2 tablets by mouth 2 (two) times daily.    [provider]  thiamine 100 MG tablet Take 1 tablet (100 mg total) by mouth daily. 01/14/21   Jonetta Osgood, MD   Physical Exam: Vitals:   12/08/2021 0941 11/24/2021 0945 11/23/2021 1100 11/26/2021 1115  BP: 129/79 130/74 118/67 118/75  Pulse: 93 93  90  Resp: (!) 24 (!) 24 (!) 24 (!) 24  SpO2: 100% 100%  100%  Weight:      Height:       Constitutional: Chronically ill-appearing.  Obtunded. Eyes: PERRL, lids and conjunctivae normal ENMT: Mucous membranes are dry.  Posterior pharynx clear of any exudate or lesions. Neck: normal, supple, no masses, no thyromegaly Respiratory: Mildly tachypneic.  Decreased breath sounds in bases with bilateral crackles, positive rhonchi, no wheezing. No accessory muscle use.  Cardiovascular: Regular rate and rhythm, no murmurs / rubs / gallops. No extremity edema. 2+ pedal pulses. No carotid bruits.  Abdomen: No distention.  Soft, no tenderness, no masses palpated. No hepatosplenomegaly. Bowel sounds positive.  Musculoskeletal: no clubbing / cyanosis.  Good ROM, no contractures. Normal muscle tone.  Skin: Stage  II sacral pressure injury on limited examination. Neurologic: Obtunded.  Grossly nonfocal.  Unable to fully evaluate. Psychiatric: Obtunded.  Unable to answer questions.  Labs on Admission: I have personally reviewed following labs and imaging studies  CBC: Recent Labs  Lab 11/20/2021 0726  WBC 14.1*  NEUTROABS 8.1*  HGB 13.3  HCT 43.5  MCV 105.6*  PLT 109*    Basic Metabolic Panel: Recent Labs  Lab 12/04/2021 0726  NA 159*  K 5.7*  CL 123*  CO2 24  GLUCOSE 62*  BUN 67*  CREATININE 1.53*  CALCIUM 9.1  MG 2.7*    GFR: Estimated Creatinine Clearance: 23.8 mL/min (A) (by C-G formula based on SCr of 1.53 mg/dL (H)).  Liver Function Tests: Recent Labs  Lab 11/12/2021 0726  AST 60*  ALT 17  ALKPHOS 100  BILITOT 1.3*  PROT 7.9  ALBUMIN 2.2*    Urine analysis:    Component Value Date/Time   COLORURINE AMBER (A) 11/26/2021 0933   APPEARANCEUR HAZY (A) 11/15/2021 0933   LABSPEC 1.018 12/07/2021 0933        PHURINE 5.0 11/27/2021 0933   GLUCOSEU NEGATIVE 11/19/2021 0933        HGBUR NEGATIVE 11/24/2021 0933   BILIRUBINUR NEGATIVE 11/28/2021 0933        KETONESUR 5 (A) 12/03/2021 0933   PROTEINUR NEGATIVE 12/12/2021 0933        NITRITE NEGATIVE 12/02/2021 0933   LEUKOCYTESUR LARGE (A) 12/05/2021 0933         Radiological Exams on Admission: CT HEAD WO CONTRAST  Result Date: 12/10/2021 CLINICAL DATA:  Mental status changes of unknown cause in a female at age 11. EXAM: CT HEAD WITHOUT CONTRAST TECHNIQUE: Contiguous axial images were obtained from the base of the skull through the vertex without intravenous contrast. RADIATION DOSE REDUCTION: This exam was performed according to the departmental dose-optimization program which includes automated exposure control, adjustment of the mA and/or kV according to patient size and/or use of iterative reconstruction technique. COMPARISON:  July 13, 2021. FINDINGS: Brain: No evidence of acute infarction, hemorrhage,  hydrocephalus, extra-axial collection or mass lesion/mass effect. Signs of atrophy and chronic microvascular ischemic change as before. Vascular: No hyperdense vessel or unexpected calcification. Skull: Normal. Negative for fracture or focal lesion. Sinuses/Orbits: Visualized paranasal sinuses and orbits are unremarkable. Other: None IMPRESSION: 1. No acute intracranial process. 2. Signs of atrophy and chronic microvascular ischemic change as before. Electronically Signed   By: Zetta Bills M.D.   On: 12/08/2021 09:05   DG Chest Port 1 View  Result Date: 11/14/2021 CLINICAL DATA:  88 year old female with history of altered mental status. Choking. EXAM: PORTABLE CHEST 1 VIEW COMPARISON:  Chest x-ray 07/13/2021. FINDINGS: Extensive airspace consolidation in the right lower lobe. Additional opacity at the left lung base medially. Small left pleural effusion. No pneumothorax. No evidence of pulmonary edema. Heart size is normal. Upper mediastinal contours are within normal limits. IMPRESSION: 1. The appearance of the chest is concerning for probable aspiration pneumonia given the patient's history of choking, as above. 2. Aortic atherosclerosis. Electronically Signed   By: Vinnie Langton M.D.   On: 12/01/2021 07:59    EKG: Independently reviewed.  Vent. rate 97 BPM PR interval 142 ms QRS duration 129 ms QT/QTcB 374/476 ms P-R-T axes 42 59 -49 Sinus rhythm Right bundle branch block  Assessment/Plan Principal Problem:   Sepsis due to undetermined organism POA In the setting of   Aspiration into airway, initial encounter And   Acute UTI (urinary tract infection) Admit to PCU/inpatient. Continue supplemental oxygen. Aspiration precautions. Continue IV fluids. Continue ceftriaxone 1 g IVPB  daily. Azithromycin 500 mg IVPB daily. Check strep pneumoniae urinary antigen. Follow-up blood culture and sensitivity. Follow-up urine culture and sensitivity. Follow-up CBC and chemistry in the  morning.  Active Problems:   AKI (acute kidney injury) ([place]) Superimposed on:   CKD (chronic kidney disease) stage 3, GFR 30-59 ml/min (HCC) Continue IV fluids. Monitor intake and output. Avoid nephrotoxins Avoid hypotension. Follow-up renal function electrolytes.    Hypernatremia Continue dextrose 5%. Monitor sodium level.    Hyperkalemia Resolved. Continue IV fluids. Follow-up potassium level    Hypoglycemia   Secondary to poor oral intake. Continue dextrose 5%.    Acute metabolic encephalopathy Secondary to sepsis. Continue sepsis treatment.    Essential hypertension Hold antihypertensives. Monitor blood pressure.    COVID-19 virus infection Already treated.    Chronic diastolic CHF (congestive heart failure) (HCC) No signs of decompensation.    Stage 2 pressure ulcer Continue local care.    DVT prophylaxis: Lovenox SQ. Code Status:   DNR. Family Communication:  Her daughter was at bedside. Disposition Plan:   Patient is from:  Home.  Anticipated DC to:  Home.  Anticipated DC date:  72 to 96 hours.  Anticipated DC barriers: Clinical status. Consults called:   Admission status:  Inpatient/PCU.  Severity of Illness: High severity in the setting of sepsis due to undetermined organism, hyperglycemia and hypernatremia.  Reubin Milan MD [facility]  How to contact the [facility] Attending or Consulting provider [place] or covering provider during after hours [place], for this patient?   Check the care team in [facility] and look for a) attending/consulting [facility] provider listed and b) the [facility] team listed Log into [email or web address] and use [place]'s universal password to access. If you do not have the password, please contact the hospital operator. Locate the [facility] provider you are looking for under [facility] and page to a number that you can be directly reached. If you still have difficulty reaching the provider, please page the [facility] (Director on Call)  for the Hospitalists listed on amion for assistance.  12/08/2021, 11:35 AM   This document was prepared using Dragon voice recognition software and may contain some unintended transcription errors.

## 2021-12-06 NOTE — Sepsis Progress Note (Signed)
Notified provider of need to order another repeat lactic acid as the second was higher than the first. ° °

## 2021-12-06 NOTE — Sepsis Progress Note (Signed)
eLink is monitoring this Code Sepsis. °

## 2021-12-06 NOTE — Progress Notes (Signed)
Consent obtained for PICC placement. NO veins large enough on L arm to place PICC. Only one vein on right upper arm suitable for PICC placement. Difficult cannulation with venipuncture achieved on 3rd attempt. Guidewire passed easily until it got to shoulder and then would not pass further. Primary RN Margie Billet RN notified. Recommend contacting MD for further orders.

## 2021-12-06 NOTE — ED Notes (Signed)
Only able to obtain 2 blue tops for blood cultures due to poor access.

## 2021-12-06 NOTE — Progress Notes (Signed)
Per Tennis Must MD via secure chat, it is ok to place PICC line with CRCL at 27. Patient is difficult to obtain IV access.

## 2021-12-06 NOTE — ED Notes (Signed)
Per IV team RN Jannette, pt's PICC line will be placed after 7 pm by the evening PICC line RN.

## 2021-12-07 DIAGNOSIS — Z7189 Other specified counseling: Secondary | ICD-10-CM

## 2021-12-07 DIAGNOSIS — E872 Acidosis, unspecified: Secondary | ICD-10-CM | POA: Diagnosis present

## 2021-12-07 DIAGNOSIS — A4159 Other Gram-negative sepsis: Secondary | ICD-10-CM | POA: Diagnosis not present

## 2021-12-07 DIAGNOSIS — R627 Adult failure to thrive: Secondary | ICD-10-CM | POA: Diagnosis present

## 2021-12-07 DIAGNOSIS — D696 Thrombocytopenia, unspecified: Secondary | ICD-10-CM | POA: Diagnosis present

## 2021-12-07 DIAGNOSIS — E8809 Other disorders of plasma-protein metabolism, not elsewhere classified: Secondary | ICD-10-CM | POA: Diagnosis present

## 2021-12-07 DIAGNOSIS — R600 Localized edema: Secondary | ICD-10-CM | POA: Diagnosis present

## 2021-12-07 DIAGNOSIS — M069 Rheumatoid arthritis, unspecified: Secondary | ICD-10-CM | POA: Diagnosis present

## 2021-12-07 DIAGNOSIS — Z66 Do not resuscitate: Secondary | ICD-10-CM | POA: Diagnosis present

## 2021-12-07 DIAGNOSIS — I878 Other specified disorders of veins: Secondary | ICD-10-CM | POA: Diagnosis present

## 2021-12-07 DIAGNOSIS — I2510 Atherosclerotic heart disease of native coronary artery without angina pectoris: Secondary | ICD-10-CM | POA: Diagnosis present

## 2021-12-07 LAB — COMPREHENSIVE METABOLIC PANEL
ALT: 10 U/L (ref 0–44)
AST: 28 U/L (ref 15–41)
Albumin: 2 g/dL — ABNORMAL LOW (ref 3.5–5.0)
Alkaline Phosphatase: 88 U/L (ref 38–126)
Anion gap: 9 (ref 5–15)
BUN: 72 mg/dL — ABNORMAL HIGH (ref 8–23)
CO2: 22 mmol/L (ref 22–32)
Calcium: 8.5 mg/dL — ABNORMAL LOW (ref 8.9–10.3)
Chloride: 123 mmol/L — ABNORMAL HIGH (ref 98–111)
Creatinine, Ser: 1.72 mg/dL — ABNORMAL HIGH (ref 0.44–1.00)
GFR, Estimated: 29 mL/min — ABNORMAL LOW (ref >60)
Glucose, Bld: 120 mg/dL — ABNORMAL HIGH (ref 70–99)
Potassium: 3.5 mmol/L (ref 3.5–5.1)
Sodium: 154 mmol/L — ABNORMAL HIGH (ref 135–145)
Total Bilirubin: 0.6 mg/dL (ref 0.3–1.2)
Total Protein: 6.5 g/dL (ref 6.5–8.1)

## 2021-12-07 LAB — BLOOD CULTURE ID PANEL (REFLEXED) - BCID2

## 2021-12-07 LAB — CBG MONITORING, ED
Glucose-Capillary: 111 mg/dL — ABNORMAL HIGH (ref 70–99)
Glucose-Capillary: 137 mg/dL — ABNORMAL HIGH (ref 70–99)
Glucose-Capillary: 146 mg/dL — ABNORMAL HIGH (ref 70–99)
Glucose-Capillary: 148 mg/dL — ABNORMAL HIGH (ref 70–99)
Glucose-Capillary: 75 mg/dL (ref 70–99)
Glucose-Capillary: 76 mg/dL (ref 70–99)

## 2021-12-07 LAB — CBC
HCT: 37.2 % (ref 36.0–46.0)
Hemoglobin: 11.3 g/dL — ABNORMAL LOW (ref 12.0–15.0)
MCH: 32.3 pg (ref 26.0–34.0)
MCHC: 30.4 g/dL (ref 30.0–36.0)
MCV: 106.3 fL — ABNORMAL HIGH (ref 80.0–100.0)
Platelets: 79 10*3/uL — ABNORMAL LOW (ref 150–400)
RBC: 3.5 MIL/uL — ABNORMAL LOW (ref 3.87–5.11)
RDW: 14.8 % (ref 11.5–15.5)
WBC: 19 10*3/uL — ABNORMAL HIGH (ref 4.0–10.5)
nRBC: 5.5 % — ABNORMAL HIGH (ref 0.0–0.2)

## 2021-12-07 MED ORDER — VALPROATE SODIUM 100 MG/ML IV SOLN
500.0000 mg | Freq: Three times a day (TID) | INTRAVENOUS | Status: DC
  Administered 2021-12-07 – 2021-12-11 (×11): 500 mg via INTRAVENOUS
  Filled 2021-12-07 (×15): qty 5

## 2021-12-07 MED ORDER — ACETAMINOPHEN 650 MG RE SUPP: 650.0000 mg | Freq: Four times a day (QID) | RECTAL | Status: DC | PRN

## 2021-12-07 MED ORDER — ONDANSETRON 4 MG PO TBDP: 4.0000 mg | ORAL_TABLET | Freq: Four times a day (QID) | ORAL | Status: DC | PRN

## 2021-12-07 MED ORDER — HYDROMORPHONE HCL 1 MG/ML IJ SOLN
0.5000 mg | INTRAMUSCULAR | Status: DC | PRN
  Administered 2021-12-08 (×2): 0.5 mg via INTRAVENOUS
  Filled 2021-12-07 (×2): qty 0.5

## 2021-12-07 MED ORDER — LORAZEPAM 1 MG PO TABS: 1.0000 mg | ORAL_TABLET | ORAL | Status: DC | PRN

## 2021-12-07 MED ORDER — GLYCOPYRROLATE 0.2 MG/ML IJ SOLN
0.2000 mg | INTRAMUSCULAR | Status: DC | PRN
  Filled 2021-12-07: qty 1

## 2021-12-07 MED ORDER — ALBUMIN HUMAN 25 % IV SOLN
12.5000 g | Freq: Once | INTRAVENOUS | Status: DC
  Filled 2021-12-07: qty 50

## 2021-12-07 MED ORDER — LORAZEPAM 2 MG/ML IJ SOLN: 1.0000 mg | INTRAMUSCULAR | Status: DC | PRN

## 2021-12-07 MED ORDER — SODIUM CHLORIDE 0.9% FLUSH
10.0000 mL | Freq: Two times a day (BID) | INTRAVENOUS | Status: DC
  Administered 2021-12-08 – 2021-12-11 (×6): 10 mL

## 2021-12-07 MED ORDER — ACETAMINOPHEN 325 MG PO TABS: 650.0000 mg | ORAL_TABLET | Freq: Four times a day (QID) | ORAL | Status: DC | PRN

## 2021-12-07 MED ORDER — ONDANSETRON HCL 4 MG/2ML IJ SOLN: 4.0000 mg | Freq: Four times a day (QID) | INTRAMUSCULAR | Status: DC | PRN

## 2021-12-07 MED ORDER — SODIUM CHLORIDE 0.9% FLUSH: 10.0000 mL | INTRAVENOUS | Status: DC | PRN

## 2021-12-07 MED ORDER — HALOPERIDOL LACTATE 2 MG/ML PO CONC
2.0000 mg | ORAL | Status: DC | PRN
  Filled 2021-12-07: qty 1

## 2021-12-07 MED ORDER — LORAZEPAM 2 MG/ML PO CONC: 1.0000 mg | ORAL | Status: DC | PRN

## 2021-12-07 MED ORDER — HALOPERIDOL LACTATE 5 MG/ML IJ SOLN: 2.0000 mg | INTRAMUSCULAR | Status: DC | PRN

## 2021-12-07 MED ORDER — HALOPERIDOL 2 MG PO TABS
2.0000 mg | ORAL_TABLET | ORAL | Status: DC | PRN
  Filled 2021-12-07: qty 1

## 2021-12-07 MED ORDER — GLYCOPYRROLATE 1 MG PO TABS
1.0000 mg | ORAL_TABLET | ORAL | Status: DC | PRN
  Filled 2021-12-07: qty 1

## 2021-12-07 NOTE — Assessment & Plan Note (Addendum)
Blood pressure stable. Was comfort care.

## 2021-12-07 NOTE — Assessment & Plan Note (Addendum)
Uremia. Likely prerenal etiology. Baseline serum creatinine around 0.6. On presentation serum creatinine 1.5, now worsening to 1.72. Along with a BUN significantly elevated 72. Patient has poor p.o. intake ongoing for last few weeks which is likely responsible for her presentation. GFR at baseline around 60.

## 2021-12-07 NOTE — Assessment & Plan Note (Addendum)
From poor p.o. intake. Had a similar presentation earlier during the September admission. Prognosis guarded.  Pt was felt at end-of-life situation and actively dying.

## 2021-12-07 NOTE — Assessment & Plan Note (Addendum)
Patient has significant decline over the last few months. Due to multiple comorbidity patient is confined to bed. Has pressure ulcer, rheumatoid arthritis.  Multiple admission in 2022.  Has poor p.o. intake for last few weeks with declining mentation and generalized weakness. Most likely her condition worsened after her recent COVID infection. This renders poor prognosis for now.

## 2021-12-07 NOTE — Assessment & Plan Note (Addendum)
CT value 28.5.  Does not appear to have any acute COVID infection.  Did not receive any treatment per family. Per SNF, Tested positive initially on 11/07/2021. Isolation was removed. Not a candidate for acute therapy for now. I suspect COVID-19 playing a significant role in patient's decline over last 1 month.

## 2021-12-07 NOTE — Assessment & Plan Note (Addendum)
Extensive discussion with the daughter on the phone multiple times during the day on 1/26. Patient's condition critical/terminal.  Remaining minimally responsive only to painful stimuli. Along with that her lactic acid level worsening, sodium level worsening as well as serum creatinine worsening. This is despite aggressive therapy and treatment receiving in the ER. Patient has poor p.o. intake and not a good candidate for feeding tube given her aspiration pneumonia presentation. Her recent COVID-19 infection renders poor prognosis as well as most likely responsible for her progressive decline. Unable to follow any commands and not safe to swallow. IV fluids third spacing with severe bilateral upper extremity edema. Limited IV access unable to monitor progression with frequent lab draws. Not a good candidate for feeding tube placement as it will not reduce risk for aspiration.  And with her mentation might actually increase the risk for the aspiration. MOST form dictates DNR status. At present I suspect that the patient is at end-of-life with progressive decline over last few weeks with poor p.o. intake most likely resulting in failure to thrive and malnutrition along with acute kidney injury and hypernatremia.  Now culminating with UTI and aspiration pneumonia with sepsis and acute metabolic encephalopathy. Based on the discussion above family agreed with the transition to complete comfort. They wanted continue IV Antibiotics and oxygen, which were followed. Anticipation was that the patient most likely will pass away in the hospital. Pt had significant decline in her condition on Dec 31, 2022. Seen with respiratory distress. Scheduled dilaudid ordered.Family meeting done to discuss options including residential hospice, but pt was felt not stable for transport.  Pt passed away on 12/31/22.

## 2021-12-07 NOTE — Assessment & Plan Note (Addendum)
Bilateral lower extremity edema. Hypoalbuminemia. Third spacing. Patient is significant swelling of bilateral upper extremity. Per daughter her IV infiltrated yesterday, I am unable to verify this information. Currently patient has a midline and a peripheral IV. Unable to draw labs secondary to poor venous access. Not a good candidate for central line placement. PICC line attempt was not successful as guidewire was not able to pass beyond shoulder. With hypoalbuminemia and third spacing IV fluid that the patient is receiving is most likely extravasating. Recommended to discontinue IV fluid.

## 2021-12-07 NOTE — Progress Notes (Signed)
PHARMACY - PHYSICIAN COMMUNICATION CRITICAL VALUE ALERT - BLOOD CULTURE IDENTIFICATION (BCID)  [name] is an 88 y.o. [facility] with a chief complaint of choking episode  Assessment:  GPC, 1/4 staph species  Name of physician (or Provider) Contacted: Clarene Essex  Current antibiotics: CTX and azith  Changes to prescribed antibiotics recommended:  None, probable contaminant  Results for orders placed or performed during the hospital encounter of 11/18/2021  Blood Culture ID Panel (Reflexed) (Collected: 12/10/2021  7:59 AM)  Result Value Ref Range   Enterococcus faecalis NOT DETECTED NOT DETECTED   Enterococcus Faecium NOT DETECTED NOT DETECTED   Listeria monocytogenes NOT DETECTED NOT DETECTED   Staphylococcus species DETECTED (A) NOT DETECTED   Staphylococcus aureus (BCID) NOT DETECTED NOT DETECTED   Staphylococcus epidermidis NOT DETECTED NOT DETECTED   Staphylococcus lugdunensis NOT DETECTED NOT DETECTED   Streptococcus species NOT DETECTED NOT DETECTED   Streptococcus agalactiae NOT DETECTED NOT DETECTED   Streptococcus pneumoniae NOT DETECTED NOT DETECTED   Streptococcus pyogenes NOT DETECTED NOT DETECTED   A.calcoaceticus-baumannii NOT DETECTED NOT DETECTED   Bacteroides fragilis NOT DETECTED NOT DETECTED   Enterobacterales NOT DETECTED NOT DETECTED   Enterobacter cloacae complex NOT DETECTED NOT DETECTED   Escherichia coli NOT DETECTED NOT DETECTED   Klebsiella aerogenes NOT DETECTED NOT DETECTED   Klebsiella oxytoca NOT DETECTED NOT DETECTED   Klebsiella pneumoniae NOT DETECTED NOT DETECTED   Proteus species NOT DETECTED NOT DETECTED   Salmonella species NOT DETECTED NOT DETECTED   Serratia marcescens NOT DETECTED NOT DETECTED   Haemophilus influenzae NOT DETECTED NOT DETECTED   Neisseria meningitidis NOT DETECTED NOT DETECTED   Pseudomonas aeruginosa NOT DETECTED NOT DETECTED   Stenotrophomonas maltophilia NOT DETECTED NOT  DETECTED   Candida albicans NOT DETECTED NOT DETECTED   Candida auris NOT DETECTED NOT DETECTED   Candida glabrata NOT DETECTED NOT DETECTED   Candida krusei NOT DETECTED NOT DETECTED   Candida parapsilosis NOT DETECTED NOT DETECTED   Candida tropicalis NOT DETECTED NOT DETECTED   Cryptococcus neoformans/gattii NOT DETECTED NOT DETECTED    Dolly Rias RPh 12/07/2021, 1:50 AM

## 2021-12-07 NOTE — ED Notes (Signed)
Pt remains unresponsive except to painful stimuli. Attempted blood draw x one w/o success.  Dr. Allena KatzPatel notified. Comfort measures provided.

## 2021-12-07 NOTE — Assessment & Plan Note (Addendum)
With hypoglycemia. Blood sugar low likely from poor p.o. intake ongoing for last few weeks. Hemoglobin A1c 6.2 11 months ago. Was comfort care.

## 2021-12-07 NOTE — ED Notes (Signed)
Daughter updated that pt had gotten a bed and would be in room 1524.

## 2021-12-07 NOTE — Assessment & Plan Note (Signed)
Likely combination of respiratory failure, pneumonia, dehydration, hypernatremia, acute kidney injury with uremia, history of seizures. Patient most likely has underlying dementia based on the CT scan showing atrophy. CT head yesterday unremarkable. Patient was able to respond to verbal stimuli earlier in the morning but as the day progressed become minimally responsive. CBG remained stable. Renders poor prognosis. Not a good candidate for feeding tube placement.

## 2021-12-07 NOTE — Progress Notes (Signed)
Progress Note   Patient: [name] [id number] DOB: 1933/09/28 DOA: 12/10/2021     1 DOS: the patient was seen and examined on 12/07/2021   Brief hospital course: No notes on file  Assessment and Plan * Sepsis due to Klebsiella [facility])- (present on admission) Aspiration pneumonia on right, lactic acidosis, Klebsiella UTI also on Humira every 2 weeks thus immunosuppressed.  Presents with tachycardia and tachypnea meeting SIRS criteria. Urine culture positive for Klebsiella. Blood culture 1 out of 4 positive for coagulase-negative staph-likely contaminant. With ongoing encephalopathy, acute kidney injury as well as acute respiratory failure. Prognosis guarded.  Lactic acid level actually getting worse despite therapy. Patient third spacing although IV fluid that she is receiving with significant bilateral upper extremity edema. Family currently wants to continue IV antibiotics but would like to transition to comfort care.  COVID-19 virus infection- (present on admission) Per family patient had a COVID positive test almost a month ago. CT value 28.5.  Still significantly high.  Likely playing a role.  Continue isolation.  Hypernatremia- (present on admission) From poor p.o. intake. Had a similar presentation earlier during the September admission. Prognosis guarded.  Most likely this is end-of-life situation and the patient is actively dying.  Acute metabolic encephalopathy- (present on admission) Likely combination of respiratory failure, pneumonia, dehydration, hypernatremia, acute kidney injury with uremia, history of seizures. Patient most likely has underlying dementia based on the CT scan showing atrophy. CT head yesterday unremarkable. Patient was able to respond to verbal stimuli earlier in the morning but as the day progressed become minimally responsive. CBG remained stable. Renders poor prognosis. Not a good candidate for feeding tube placement.  Acute renal  failure superimposed on stage 2 chronic kidney disease ([place])- (present on admission) Uremia. Likely prerenal etiology. Baseline serum creatinine around 0.6. On presentation serum creatinine 1.5, now worsening to 1.72. Along with a BUN significantly elevated 172. Patient has poor p.o. intake ongoing for last few weeks which is likely responsible for her presentation. GFR at baseline around 60.  Thrombocytopenia ([place])- (present on admission) Most likely this is in the setting of sepsis. No evidence of hemolysis so far. But renders poor prognosis.  Poor venous access- (present on admission) Bilateral lower extremity edema. Hypoalbuminemia. Third spacing. Patient is significant swelling of bilateral upper extremity. Per daughter her IV infiltrated yesterday, I am unable to verify this information. Currently patient has a midline and a peripheral IV. Unable to draw labs secondary to poor venous access. Not a good candidate for central line placement. PICC line attempt was not successful as guidewire was not able to pass beyond shoulder. With hypoalbuminemia and third spacing IV fluid that the patient is receiving is most likely extravasating. Recommended to discontinue IV fluid.  Adult failure to thrive- (present on admission) Patient has significant decline over the last few months. Due to multiple comorbidity patient is confined to bed. Has pressure ulcer, rheumatoid arthritis.  Multiple admission in 2022.  Has poor p.o. intake for last few weeks with declining mentation and generalized weakness. Most likely her condition worsened after her recent COVID infection. This renders poor prognosis for now.    Type 2 diabetes mellitus with diabetic nephropathy, without long-term current use of insulin ([place])- (present on admission) With hypoglycemia. Blood sugar low likely from poor p.o. intake ongoing for last few weeks. Hemoglobin A1c 6.2 11 months ago. Currently comfort  care.  Chronic diastolic CHF (congestive heart failure) ([place])- (present on admission) Significant volume overload in the setting of  third spacing. Not a good candidate for aggressive diuresis in the setting of worsening renal function secondary to poor p.o. intake.  Essential hypertension- (present on admission) Blood pressure stable. Currently comfort care.  Pressure injury of coccygeal region, stage 2 ([place])- (present on admission) Continue foam dressing for comfort.  Seizures ([place]) On Depakote. Unwitnessed seizure cannot rule out in the setting of encephalopathy. At present no evidence of seizure. Medications on hold.  Hyperlipidemia- (present on admission) On statin.  Medication on hold.  Depression- (present on admission) On Cymbalta. Renders poor prognosis. Monitor.  Goals of care, counseling/discussion Extensive discussion with the daughter on the phone multiple times during the day on 1/26. Discussed with daughter in the morning.  Patient's condition worsening.  Remaining minimally responsive only to painful stimuli. Along with that her lactic acid level worsening, sodium level worsening as well as serum creatinine worsening. This is despite aggressive therapy and treatment receiving in the ER. Patient has poor p.o. intake and not a good candidate for feeding tube given her aspiration pneumonia presentation. Her recent COVID-19 infection renders poor prognosis as well as most likely responsible for her progressive decline. Unable to follow any commands and safe to swallow. Receiving IV dextrose but most likely third spacing with severe bilateral upper extremity edema. Limited IV access unable to monitor progression with frequent lab draws. Not a good conversant renal placement as well. Not a good candidate for feeding tube placement as it will not reduce risk for aspiration.  And with her mentation might actually increase the risk for the aspiration. MOS T form clearly  dictates DNR status. At present I suspect that the patient is at end-of-life with progressive decline over last few weeks with poor p.o. intake most likely resulting in failure to thrive and malnutrition along with acute kidney injury and hypernatremia.  Now culminating with UTI and aspiration pneumonia with sepsis and acute metabolic encephalopathy. Discussed with daughter Vaughan Basta [facility] POA and Farley Ly on the phone. Based on the discussion above family agreed with the transition to complete comfort. They would like to continue IV fluid they would like to continue oxygen. Anticipating that the patient most likely will pass away in the hospital.      Subjective: Minimally responsive to painful stimuli.  No nausea no vomiting no fever no chills.  Mentation progressively worsening as the day progressed.  Objective Vitals:   12/07/21 1317 12/07/21 1530 12/07/21 1600 12/07/21 1700  BP: 109/79 90/76 96/65  120/70  Pulse: 73     Resp: 18 15 18 20   Temp:      TempSrc:      SpO2: 100%   93%  Weight:      Height:        General: Appear in mild distress, no Rash; Oral Mucosa Clear, dry. no Abnormal Neck Mass Or lumps, Conjunctiva normal  Cardiovascular: S1 and S2 Present, aortic systolic murmur, Respiratory: Increased respiratory effort, Bilateral Air entry present and bilateral crackles, no wheezes Abdomen: Bowel Sound present, Soft and difficult to assess tenderness Extremities: Trace pedal edema, bilateral severe extremity edema upper Neurology: Lethargic, not oriented, unable to follow any commands, withdraws to painful stimuli. Gait not checked due to patient safety concerns    Data Reviewed:  I have Reviewed nursing notes, Vitals, and Lab results since pt's last encounter. Pertinent lab results CMP shows sodium 134, chloride 123, serum creatinine worsening from 1.53-1.72, BUN worsening from 67-72, lactic acid worsening from 2.8-4.1.  CBC shows worsening leukocytosis and thrombocytopenia  with  anemia.  INR elevated. I have ordered test including BMP I have independently visualized and interpreted imaging chest x-ray which showed right lower lobe pneumonia. I have independently visualized and interpreted EKG which showed EKG: nonspecific ST and T waves changes, sinus tachycardia. I have reviewed the last note from hematology and EDP.  I have consulted palliative care.  Family Communication: Daughter at bedside.  Discussed with 2 daughter on the phone.  Disposition: Status is: Inpatient  Remains inpatient appropriate because: Comfort care, anticipating in-hospital death.         Author: Berle Mull, MD 12/07/2021 7:28 PM  For on call review [email or web address].

## 2021-12-07 NOTE — Assessment & Plan Note (Signed)
Significant volume overload in the setting of third spacing. Not a good candidate for aggressive diuresis in the setting of worsening renal function secondary to poor p.o. intake.

## 2021-12-07 NOTE — Assessment & Plan Note (Addendum)
Aspiration pneumonia on right, lactic acidosis, Klebsiella UTI also on Humira every 2 weeks thus immunosuppressed. Presents with tachycardia and tachypnea meeting SIRS criteria. Urine culture positive for Klebsiella. Blood culture 1 out of 4 positive for coagulase-negative staph-likely contaminant. With ongoing encephalopathy, acute kidney injury as well as acute respiratory failure, Prognosis guarded.   Lactic acid level was getting worse despite therapy. Patient third spacing, IV fluid that she was receiving resulting in significant bilateral upper extremity edema. Family currently wants to continue IV antibiotics but would like to transition to comfort care. Was treated wth IV ceftriaxone for total 5-day and azithromycin for 3 days.

## 2021-12-07 NOTE — Assessment & Plan Note (Addendum)
On Depakote. Unwitnessed seizure cannot rule out in the setting of encephalopathy. At present no evidence of seizure. home dose 625 mg 3 times daily.  Was on 500 mg 3 times daily IV.

## 2021-12-07 NOTE — Assessment & Plan Note (Deleted)
Continue foam dressing for comfort.

## 2021-12-07 NOTE — ED Notes (Signed)
Patients daughter would like a call back with an update: [phone] [facility]

## 2021-12-07 NOTE — Assessment & Plan Note (Signed)
Most likely this is in the setting of sepsis. No evidence of hemolysis so far. But renders poor prognosis.

## 2021-12-07 NOTE — Progress Notes (Signed)
Consulted for PIV., due to unable to place PICC line. Assessed with and without ultrasound and found both arms extremely swollen...nothing found with ultrasound. Placed 22g catheter in left post/lat wrist and 22g in R upper arm. Patient will need better access.

## 2021-12-07 NOTE — Assessment & Plan Note (Signed)
On Cymbalta. Renders poor prognosis. Monitor.

## 2021-12-07 NOTE — Assessment & Plan Note (Signed)
On statin.  Medication on hold.

## 2021-12-08 ENCOUNTER — Other Ambulatory Visit: Payer: Self-pay

## 2021-12-08 DIAGNOSIS — A4159 Other Gram-negative sepsis: Secondary | ICD-10-CM | POA: Diagnosis not present

## 2021-12-08 LAB — CULTURE, BLOOD (ROUTINE X 2)

## 2021-12-08 LAB — URINE CULTURE: Culture: >=100000 — AB

## 2021-12-08 MED ORDER — CHLORHEXIDINE GLUCONATE 0.12 % MT SOLN
15.0000 mL | Freq: Two times a day (BID) | OROMUCOSAL | Status: DC
  Administered 2021-12-08 – 2021-12-11 (×5): 15 mL via OROMUCOSAL
  Filled 2021-12-08 (×5): qty 15

## 2021-12-08 MED ORDER — ORAL CARE MOUTH RINSE
15.0000 mL | Freq: Two times a day (BID) | OROMUCOSAL | Status: DC
  Administered 2021-12-09 – 2021-12-11 (×5): 15 mL via OROMUCOSAL

## 2021-12-08 NOTE — Progress Notes (Signed)
Progress Note   Patient: [name] [id number] DOB: 05-Aug-1933 DOA: 11/15/2021     2 DOS: the patient was seen and examined on 12/08/2021   Brief hospital course: No notes on file  Assessment and Plan * Sepsis due to Klebsiella [facility])- (present on admission) Aspiration pneumonia on right, lactic acidosis, Klebsiella UTI also on Humira every 2 weeks thus immunosuppressed.  Presents with tachycardia and tachypnea meeting SIRS criteria. Urine culture positive for Klebsiella. Blood culture 1 out of 4 positive for coagulase-negative staph-likely contaminant. With ongoing encephalopathy, acute kidney injury as well as acute respiratory failure. Prognosis guarded.  Lactic acid level actually getting worse despite therapy. Patient third spacing although IV fluid that she is receiving with significant bilateral upper extremity edema. Family currently wants to continue IV antibiotics but would like to transition to comfort care. Continue IV ceftriaxone for total 5-day.  Discontinue azithromycin after 3 days.  COVID-19 virus infection- (present on admission) CT value 28.5.  Does not appear to have any acute COVID infection.  Did not receive any treatment but family. Tested positive initially on 11/07/2021. Will remove isolation.  Not a candidate for acute therapy for now. Although I suspect [id number] playing a significant role in patient's decline over last 1 month.  Hypernatremia- (present on admission) From poor p.o. intake. Had a similar presentation earlier during the September admission. Prognosis guarded.  Most likely this is end-of-life situation and the patient is actively dying.  Acute metabolic encephalopathy- (present on admission) Likely combination of respiratory failure, pneumonia, dehydration, hypernatremia, acute kidney injury with uremia, history of seizures. Patient most likely has underlying dementia based on the CT scan showing atrophy. CT head yesterday  unremarkable. Patient was able to respond to verbal stimuli earlier in the morning but as the day progressed become minimally responsive. CBG remained stable. Renders poor prognosis. Not a good candidate for feeding tube placement.  Acute renal failure superimposed on stage 2 chronic kidney disease ([place])- (present on admission) Uremia. Likely prerenal etiology. Baseline serum creatinine around 0.6. On presentation serum creatinine 1.5, now worsening to 1.72. Along with a BUN significantly elevated 172. Patient has poor p.o. intake ongoing for last few weeks which is likely responsible for her presentation. GFR at baseline around 60.  Thrombocytopenia ([place])- (present on admission) Most likely this is in the setting of sepsis. No evidence of hemolysis so far. But renders poor prognosis.  Poor venous access- (present on admission) Bilateral lower extremity edema. Hypoalbuminemia. Third spacing. Patient is significant swelling of bilateral upper extremity. Per daughter her IV infiltrated yesterday, I am unable to verify this information. Currently patient has a midline and a peripheral IV. Unable to draw labs secondary to poor venous access. Not a good candidate for central line placement. PICC line attempt was not successful as guidewire was not able to pass beyond shoulder. With hypoalbuminemia and third spacing IV fluid that the patient is receiving is most likely extravasating. Recommended to discontinue IV fluid.  Adult failure to thrive- (present on admission) Patient has significant decline over the last few months. Due to multiple comorbidity patient is confined to bed. Has pressure ulcer, rheumatoid arthritis.  Multiple admission in 2022.  Has poor p.o. intake for last few weeks with declining mentation and generalized weakness. Most likely her condition worsened after her recent COVID infection. This renders poor prognosis for now.    Type 2 diabetes mellitus with  diabetic nephropathy, without long-term current use of insulin ([place])- (present on admission) With hypoglycemia. Blood sugar  low likely from poor p.o. intake ongoing for last few weeks. Hemoglobin A1c 6.2 11 months ago. Currently comfort care.  Chronic diastolic CHF (congestive heart failure) ([facility])- (present on admission) Significant volume overload in the setting of third spacing. Not a good candidate for aggressive diuresis in the setting of worsening renal function secondary to poor p.o. intake.  Essential hypertension- (present on admission) Blood pressure stable. Currently comfort care.  Pressure injury of coccygeal region, stage 2 ([facility])- (present on admission) Continue foam dressing for comfort.  Seizures ([facility]) On Depakote. Unwitnessed seizure cannot rule out in the setting of encephalopathy. At present no evidence of seizure. home dose 625 mg 3 times daily.  Currently on 500 mg 3 times daily IV.  Hyperlipidemia- (present on admission) On statin.  Medication on hold.  Depression- (present on admission) On Cymbalta. Renders poor prognosis. Monitor.  Goals of care, counseling/discussion Extensive discussion with the daughter on the phone multiple times during the day on 1/26. Patient's condition critical/terminal.  Remaining minimally responsive only to painful stimuli. Along with that her lactic acid level worsening, sodium level worsening as well as serum creatinine worsening. This is despite aggressive therapy and treatment receiving in the ER. Patient has poor p.o. intake and not a good candidate for feeding tube given her aspiration pneumonia presentation. Her recent COVID-19 infection renders poor prognosis as well as most likely responsible for her progressive decline. Unable to follow any commands and not safe to swallow. IV fluids third spacing with severe bilateral upper extremity edema. Limited IV access unable to monitor progression with frequent lab draws. Not  a good candidate for feeding tube placement as it will not reduce risk for aspiration.  And with her mentation might actually increase the risk for the aspiration. MOST form clearly dictates DNR status. At present I suspect that the patient is at end-of-life with progressive decline over last few weeks with poor p.o. intake most likely resulting in failure to thrive and malnutrition along with acute kidney injury and hypernatremia.  Now culminating with UTI and aspiration pneumonia with sepsis and acute metabolic encephalopathy. Discussed with daughter [name] [facility] POA and Elisabeth Most on the phone. Based on the discussion above family agreed with the transition to complete comfort. They would like to continue IV fluid they would like to continue oxygen. Anticipating that the patient most likely will pass away in the hospital. 1/27 discussed with palliative care recommended continue current care.  Agree that the patient most likely will pass away in the hospital. Another family discussion with son Zoe Lan and daughter [name].  They had questions regarding patient's hydration status, urine color and output.  Explained what to expect as the patient is actively dying and how we will help the patient for symptom control going forward.  Discussed also at length with regards to possibility of residential hospice transfer.  We will place a referral to hospice.  After this discussion both agrees to continue comfort care, understand the dying process.   Rheumatoid arthritis ([facility])- (present on admission) Immunosuppressed secondary to the treatment. Placing the patient at high risk for poor outcome.     Subjective: Minimally responsive.  No nausea or vomiting.  No acute events.  Appears comfortable.  Objective Vitals:   12/08/21 0200 12/08/21 0500 12/08/21 0532 12/08/21 1426  BP:  (!) 124/43 95/82 (!) 102/52  Pulse: 93 87 89 61  Resp:  18  18  Temp:  (!) 97.4 F (36.3 C)  97.8 F (36.6 C)  TempSrc:  Axillary  Oral  SpO2: 92% 92%  99%  Weight:      Height:        General: Appear in mild distress; no visible Abnormal Neck Mass Or lumps, Cardiovascular: S1 and S2 Present, aortic systolic Murmur, Respiratory: good respiratory effort, Bilateral Air entry present and faint basal Crackles, no wheezes Abdomen: Bowel Sound present Extremities: no Pedal edema, improving bilateral upper extremity edema Neurology: obtunded and not oriented to time, place, and person Gait not checked due to patient safety concerns   Data Reviewed:  There are no new results to review at this time.  Family Communication: Discussed with daughter and son at bedside.  Disposition: Status is: Inpatient  Remains inpatient appropriate because: Anticipating in-hospital death.  Currently comfort care.    Author: Berle Mull, MD 12/08/2021 7:36 PM  For on call review [email or web address].

## 2021-12-08 NOTE — Plan of Care (Signed)
Patient is "comfort care". Remains on supplemental O2 per family request.   Problem: Education: Goal: Knowledge of General Education information will improve Description: Including pain rating scale, medication(s)/side effects and non-pharmacologic comfort measures Outcome: Not Progressing   Problem: Health Behavior/Discharge Planning: Goal: Ability to manage health-related needs will improve Outcome: Not Progressing   Problem: Clinical Measurements: Goal: Ability to maintain clinical measurements within normal limits will improve Outcome: Not Progressing Goal: Will remain free from infection Outcome: Not Progressing Goal: Diagnostic test results will improve Outcome: Not Progressing Goal: Respiratory complications will improve Outcome: Not Progressing Goal: Cardiovascular complication will be avoided Outcome: Not Progressing   Problem: Activity: Goal: Risk for activity intolerance will decrease Outcome: Not Progressing   Problem: Nutrition: Goal: Adequate nutrition will be maintained Outcome: Not Progressing   Problem: Coping: Goal: Level of anxiety will decrease Outcome: Not Progressing   Problem: Elimination: Goal: Will not experience complications related to bowel motility Outcome: Not Progressing Goal: Will not experience complications related to urinary retention Outcome: Not Progressing   Problem: Pain Managment: Goal: General experience of comfort will improve Outcome: Not Progressing   Problem: Safety: Goal: Ability to remain free from injury will improve Outcome: Not Progressing   Problem: Skin Integrity: Goal: Risk for impaired skin integrity will decrease Outcome: Not Progressing

## 2021-12-08 NOTE — Assessment & Plan Note (Signed)
Immunosuppressed secondary to the treatment. Placing the patient at high risk for poor outcome.

## 2021-12-08 NOTE — Progress Notes (Signed)
[name] place RN, [name], confirmed the pt tested positive for Covid on 11/03/21.  Infectious disease notified of prior positive test.  Airborne and contact precautions discontinued.

## 2021-12-08 NOTE — Progress Notes (Signed)
Chaplain provided support to patient's family.  Davon has several children.  This evening, [name] and Alinda Money as well as Linda's husband were present.  Chaplain normalized some of the things they were observing in her body as it changes and provided reassurance that they are doing what she most needs from them by sitting with her, talking with her, playing her music.  Chaplain provided prayer, at family's request, as well as ministry of presence.  Chaplain Dyanne Carrel, Bcc Pager, [phone] 10:42 PM

## 2021-12-08 NOTE — Progress Notes (Signed)
Transition of Care [facility](TOC) Screening Note  Patient Details  Name: Shelby SimmondsHelen G Horton Date of Birth: 09/01/1933  Transition of Care [facility](TOC) CM/SW Contact:    Ewing SchleinMegan S Dymon Summerhill, LCSW Phone Number: 12/08/2021, 10:07 AM  Transition of Care Department [facility](TOC) has reviewed patient and no TOC needs have been identified at this time. We will continue to monitor patient advancement through interdisciplinary progression rounds. If new patient transition needs arise, please place a TOC consult.

## 2021-12-09 DIAGNOSIS — A4159 Other Gram-negative sepsis: Secondary | ICD-10-CM | POA: Diagnosis not present

## 2021-12-09 MED ORDER — LIP MEDEX EX OINT
TOPICAL_OINTMENT | CUTANEOUS | Status: DC | PRN
  Filled 2021-12-09: qty 7

## 2021-12-09 MED ORDER — BLISTEX MEDICATED EX OINT
TOPICAL_OINTMENT | CUTANEOUS | Status: DC | PRN
  Filled 2021-12-09: qty 6.3

## 2021-12-09 NOTE — Progress Notes (Signed)
[facility] PROGRESS NOTE  Patient: [name] [id number]:[id number]   PCP: Tresa GarterPlotnikov, Aleksei V, MD DOB: 07/31/1933   DOA: 01-11-22   DOS: 12/09/2021    Subjective: Intermittently opening up her eyes still not able to follow any commands.  No nonverbal.  No acute events overnight.  Bradycardic, hypotensive.  Appears comfortable.  Objective:  Vitals:   12/08/21 1426 12/09/21 0630  BP: (!) 102/52 (!) 88/58  Pulse: 61 (!) 52  Resp: 18   Temp: 97.8 F (36.6 C) (!) 97.5 F (36.4 C)  SpO2: 99%     S1-S2 present. Faint basal crackles.  No wheezing.  Assessment and plan: Goals of care conversation. Discussed with daughter again at bedside. There was another daughter today at the bedside.  She had questions about patient's mentation, kidney shutting down as well as prognosis. Explained in detail regarding patient's current presentation which appears to be slow but active process of dying. Both daughters currently agrees to continue with comfort care. I have explained that the antibiotics will complete on day 5. Continue with IV Depakote. Anticipating hospital death. Currently not referring patient to residential hospice.  Author: Lynden OxfordPranav Boneta Standre, MD [facility] 12/09/2021 6:21 PM   If 7PM-7AM, please contact night-coverage at [email or web address]

## 2021-12-10 DIAGNOSIS — U071 COVID-19: Secondary | ICD-10-CM

## 2021-12-10 DIAGNOSIS — R6520 Severe sepsis without septic shock: Secondary | ICD-10-CM

## 2021-12-10 DIAGNOSIS — R627 Adult failure to thrive: Secondary | ICD-10-CM

## 2021-12-10 DIAGNOSIS — A4159 Other Gram-negative sepsis: Secondary | ICD-10-CM | POA: Diagnosis not present

## 2021-12-10 DIAGNOSIS — G9341 Metabolic encephalopathy: Secondary | ICD-10-CM

## 2021-12-10 DIAGNOSIS — Z66 Do not resuscitate: Secondary | ICD-10-CM

## 2021-12-10 DIAGNOSIS — Z515 Encounter for palliative care: Secondary | ICD-10-CM

## 2021-12-10 DIAGNOSIS — E872 Acidosis, unspecified: Secondary | ICD-10-CM

## 2021-12-10 DIAGNOSIS — Z7189 Other specified counseling: Secondary | ICD-10-CM

## 2021-12-10 DIAGNOSIS — E785 Hyperlipidemia, unspecified: Secondary | ICD-10-CM

## 2021-12-10 DIAGNOSIS — I5032 Chronic diastolic (congestive) heart failure: Secondary | ICD-10-CM

## 2021-12-10 DIAGNOSIS — A419 Sepsis, unspecified organism: Secondary | ICD-10-CM

## 2021-12-10 DIAGNOSIS — G9340 Encephalopathy, unspecified: Secondary | ICD-10-CM

## 2021-12-10 DIAGNOSIS — E870 Hyperosmolality and hypernatremia: Secondary | ICD-10-CM

## 2021-12-10 DIAGNOSIS — E1121 Type 2 diabetes mellitus with diabetic nephropathy: Secondary | ICD-10-CM

## 2021-12-10 DIAGNOSIS — E8809 Other disorders of plasma-protein metabolism, not elsewhere classified: Secondary | ICD-10-CM

## 2021-12-10 DIAGNOSIS — J690 Pneumonitis due to inhalation of food and vomit: Secondary | ICD-10-CM

## 2021-12-10 NOTE — Progress Notes (Addendum)
[facility] PROGRESS NOTE  Patient: [name] [id number]   PCP: Shelby Anger, MD DOB: June 27, 1933   DOA: 12/05/2021   DOS: 12/10/2021    Subjective: Minimally responsive.  Opening eyes on verbal cues.  Unable to follow any commands.  No other acute event overnight.  Objective:  Vitals:   12/09/21 0630 12/09/21 2009  BP: (!) 88/58 (!) 87/53  Pulse: (!) 52 (!) 58  Resp:  12  Temp: (!) 97.5 F (36.4 C) (!) 97.4 F (36.3 C)  SpO2:  100%    Appears comfortable. S1-S2 present.  Bradycardic. Bilateral faint crackles.  No wheezing.  Assessment and plan: Goals of care. Last day of antibiotic today. Patient's blood pressure is soft heart rate slow.  Clinical evaluation unchanged from yesterday.  Still remains poor candidate for transfer to residential hospice. Will monitor.  Appearing comfortable.  Not requiring a lot of medication for assistance. Will consult palliative care team for further assistance.  Discussed with patient's daughter [name], [name].  Author: Berle Mull, MD [facility] 12/10/2021 1:49 PM   If 7PM-7AM, please contact night-coverage at [email or web address]

## 2021-12-10 NOTE — Progress Notes (Signed)
[facility] ([facility]) ° °Ms. Howatt was set to be admitted to hospice services in the community but was readmitted to the hospital prior to hospice services starting. ° °PMT met with family, they want to explore all options (hospital death vs residential hospice).  ° °Spoke with son Antonio, he wants to meet Monday morning after he confers with two of his sisters.  ° °[facility] will reach out to Antonio Monday am and set up a time to meet with the family to further discuss. ° °Thank you, °Jennifer Woody RN, BSN °[facility] Hospital Liaison  °

## 2021-12-10 NOTE — Consult Note (Signed)
Palliative Care Consult Note                                  Date: 12/10/2021   Patient Name: [name]  DOB: 05/27/1933  MRN: [id number]  Age / Sex: 88 y.o., female  PCP: Shelby Anger, MD Referring Physician: Lavina Hamman, MD  Reason for Consultation: Establishing goals of care, Inpatient hospice referral, Non pain symptom management, and Pain control  HPI/Patient Profile: 88 y.o. female  with past medical history of iron deficiency anemia, thoracic aortic aneurysm, anxiety, depression, B12 deficiency, CAD, chronic back pain, constipation, GERD, gout, hypertension, hyperlipidemia, obesity, osteoporosis, history of pancreatitis, polyarthritis, history of seizure disorder, tinnitus, urinary frequency, history of vertigo, vitamin D deficiency who is brought to the emergency department from her facility due to having an aspiration into airway event and progressively worse altered mental status for the past week. She was obtunded in the ED and admitted on 12/08/2021 with sepsis due to Klebsiella, COVID-19 virus, hypernatremia, acute metabolic encephalopathy, AKI on stage 2 CKD, FTT, and others.   The patient was made comfort care by the attending physician after discussions with the family.  PMT was consulted for symptom management, transition options, and family support.  Past Medical History:  Diagnosis Date   Anemia    iron deficiency   Aneurysm, thoracic aortic    Anxiety    B12 deficiency    CAD (coronary artery disease)    s/p stenting of LAD 1999- cath 5-08 EF normal LAD 30-40% restenosis. D1 50% D2 80% LCX & RCA minimal plaque   Chronic back pain    Constipation    Depression    GERD (gastroesophageal reflux disease)    Gout    HTN (hypertension)    Hyperlipemia    Obesity    Osteoporosis    Pancreatitis    Polyarthritis    DJD/ possible PMR   Renal insufficiency    Cr 1.2-1.3   Seizures (HCC)    Tinnitus     Urinary frequency    Vertigo    Vitamin D deficiency     Subjective:   This NP [name] reviewed medical records, received report from team, assessed the patient and then meet at the patient's bedside to discuss diagnosis, prognosis, GOC, EOL wishes disposition and options.  I met with the patient's son [name]") at the bedside.   Concept of Palliative Care was introduced as specialized medical care for people and their families living with serious illness.  If focuses on providing relief from the symptoms and stress of a serious illness.  The goal is to improve quality of life for both the patient and the family. Values and goals of care important to patient and family were attempted to be elicited.  Created space and opportunity for patient  and family to explore thoughts and feelings regarding current medical situation   Natural trajectory and current clinical status were discussed. Questions and concerns addressed. Patient  encouraged to call with questions or concerns.    Patient/Family Understanding of Illness: They understand she is nearing end of life. She was more active a couple days ago, but not not interacting or responding. They have seen her respiratory rate and heart rate slow in the past few days.  Life Review: Deferred  Patient Values: Deferred  Goals: Family's goals for the patient at this time are comfort  Today's Discussion:  Spent a significant amount of time with the patient's son exploring her current clinical situation, end of life status, and comfort care philosophies. Also explored options for care moving forward including in-hospital death versus transition to residential hospice. They would like to speak with [facility] liaison to know all their options in order to make the best decision possible.   I offered comfort to [name] who appeared to be actively grieving. He shared they just lost their father a couple months ago. He is having a hard time with  compounded loss. I offered to re-consult spiritual care for family support and grieving, and he accepted.  I provided emotional and general support through therapeutic listening, empathy, sharing of stories, and other techniques. I answered all questions and addressed all concerns to the best of my ability.  Review of Systems  Unable to perform ROS: Acuity of condition   Objective:   Primary Diagnoses: Present on Admission:  Aspiration pneumonia ([place])  Hypernatremia  Acute metabolic encephalopathy  Hypoglycemia  Chronic diastolic CHF (congestive heart failure) (HCC)  Acute renal failure superimposed on stage 2 chronic kidney disease (HCC)  Essential hypertension  COVID-19 virus infection  Sepsis due to Klebsiella ([place])  Pressure injury of coccygeal region, stage 2 ([place])  Acute UTI (urinary tract infection)  Adult failure to thrive  Hyperlipidemia  Type 2 diabetes mellitus with diabetic nephropathy, without long-term current use of insulin (HCC)  Depression  Thrombocytopenia (HCC)  Poor venous access  Edema of both upper arms  Hypoalbuminemia  DNR (do not resuscitate)  Lactic acidosis  CAD (coronary artery disease)  Rheumatoid arthritis ([place])   Physical Exam Vitals and nursing note reviewed.  Constitutional:      General: She is sleeping. She is not in acute distress.    Appearance: She is ill-appearing.     Comments: unresponsive  HENT:     Head: Normocephalic and atraumatic.  Cardiovascular:     Rate and Rhythm: Normal rate.  Pulmonary:     Effort: No respiratory distress.  Abdominal:     General: Abdomen is flat.     Palpations: Abdomen is soft.  Skin:    General: Skin is warm and dry.  Neurological:     Mental Status: She is unresponsive.    Vital Signs:  BP (!) 87/53 (BP Location: Right Arm)    Pulse (!) 58    Temp (!) 97.4 F (36.3 C) (Axillary)    Resp 12    Ht 5' 5"  (1.651 m)    Wt 62.2 kg    SpO2 100%    BMI 22.82 kg/m   Palliative  Assessment/Data: 10%    Advanced Care Planning:   Primary Decision Maker: NEXT OF KIN  Code Status/Advance Care Planning: DNR  Decisions/Changes to ACP: None today  Assessment & Plan:   Impression: 88 year old famale with multiple comorbidities and acute presentations as outlined above. She is actively dying, nearing end of life. Remains on comfort care. Prognosis grim. Options for disposition include in hospital death versus transition to residential hospice.  SUMMARY OF RECOMMENDATIONS   Continue DNR Continue comfort care TOC referral for residential hospice evaluation to know options PMT will continue to follow  Symptom Management:  Per primary team PMT is available to assist as needed  Prognosis:  Hours - Days  Discharge Planning:  To Be Determined   Discussed with: Medical team, nursing team, patient's family    Thank you for allowing [place] to participate in the care of [facility]  Loretha Brasil PMT will continue to support holistically.  Time Total: 90 min  Greater than 50%  of this time was spent counseling and coordinating care related to the above assessment and plan.  Signed by: Shelby Field, NP Palliative Medicine Team  Team Phone # [phone] (Nights/Weekends)  12/10/2021, 4:33 PM

## 2021-12-11 DIAGNOSIS — A4159 Other Gram-negative sepsis: Secondary | ICD-10-CM | POA: Diagnosis not present

## 2021-12-11 DIAGNOSIS — R402431 Glasgow coma scale score 3-8, in the field [EMT or ambulance]: Secondary | ICD-10-CM

## 2021-12-11 DIAGNOSIS — F32A Depression, unspecified: Secondary | ICD-10-CM

## 2021-12-11 LAB — CULTURE, BLOOD (ROUTINE X 2)
Culture: NO GROWTH
Special Requests: ADEQUATE

## 2021-12-11 MED ORDER — HYDROMORPHONE HCL 1 MG/ML IJ SOLN
0.5000 mg | INTRAMUSCULAR | Status: DC
  Administered 2021-12-11: 0.5 mg via INTRAVENOUS
  Filled 2021-12-11: qty 0.5

## 2021-12-13 NOTE — Progress Notes (Signed)
Daily Progress Note   Patient Name: [name]       Date: 2021-12-31 DOB: [birth year]  Age: 88 y.o. MRN#: [id number] Attending Physician: Shelby Hamman, MD Primary Care Physician: Shelby Anger, MD Admit Date: 12/10/2021 Length of Stay: 5 days  Reason for Consultation/Follow-up: Establishing goals of care, Hospice Evaluation, Non pain symptom management, Pain control, and Terminal Care  HPI/Patient Profile:  88 y.o. female  with past medical history of iron deficiency anemia, thoracic aortic aneurysm, anxiety, depression, B12 deficiency, CAD, chronic back pain, constipation, GERD, gout, hypertension, hyperlipidemia, obesity, osteoporosis, history of pancreatitis, polyarthritis, history of seizure disorder, tinnitus, urinary frequency, history of vertigo, vitamin D deficiency who is brought to the emergency department from her facility due to having an aspiration into airway event and progressively worse altered mental status for the past week. She was obtunded in the ED and admitted on 11/27/2021 with sepsis due to Klebsiella, COVID-19 virus, hypernatremia, acute metabolic encephalopathy, AKI on stage 2 CKD, FTT, and others.    The patient was made comfort care by the attending physician after discussions with the family.   PMT was consulted for symptom management, transition options, and family support.  Subjective:   Subjective: Chart Reviewed. Updates received. Patient Assessed. Created space and opportunity for patient  and family to explore thoughts and feelings regarding current medical situation.  Today's Discussion: We had a family meeting with myself, patient's son [name], patient's daughters [name] and [name] in person, patient's son-in-law [name]; additionally another son-in-law and sister-in-law were participating virtually on video chat. We were also joined by [name] and Shelby Pierce, RN [facility] hospice liaison).  We discussed and reviewed the patients current clinical status,  including that she is end of life. Family seems to accept this. We had a substantial discussion on options of remaining in place for in-hospital passing versus transition to residential hospice. We discussed the risks and benefits of each option. Shelby Pierce, RN provided substantial input on hospice services that could be offered. We spent substantial time answering questions as well.  Eventually, after much discussion, the family has elected to remain in place to promote comfort and avoid discomfort of transfer/ambulance ride.  I provided emotional and general support thorough therapeutic listening, empathy, and other techniques. I answered all questions and addressed all concerns to the best of my ability.  Review of Systems  Unable to perform ROS  Objective:   Vital Signs:  BP (!) 92/47 (BP Location: Right Arm)    Pulse (!) 48    Temp (!) 97.4 F (36.3 C) (Axillary)    Resp 16    Ht 5\' 5"  (1.651 m)    Wt 62.2 kg    SpO2 100%    BMI 22.82 kg/m   Physical Exam: Physical Exam Constitutional:      General: She is not in acute distress.    Appearance: She is ill-appearing.  Cardiovascular:     Heart sounds: No murmur heard. Pulmonary:     Effort: No respiratory distress.     Breath sounds: Rhonchi present.     Comments: Breathing irregular, intermittent "gasping" breaths Skin:    General: Skin is warm and dry.    Palliative Assessment/Data: 10%   Assessment & Plan:   Impression: Present on Admission:  Aspiration pneumonia (HCC)  Hypernatremia  Acute metabolic encephalopathy  Hypoglycemia  Chronic diastolic CHF (congestive heart failure) (HCC)  Acute renal failure superimposed on stage 2 chronic kidney disease ([place])  Essential hypertension  COVID-19 virus  infection  Sepsis due to Klebsiella ([place])  Pressure injury of coccygeal region, stage 2 ([place])  Acute UTI (urinary tract infection)  Adult failure to thrive  Hyperlipidemia  Type 2 diabetes mellitus with diabetic  nephropathy, without long-term current use of insulin (HCC)  Depression  Thrombocytopenia (HCC)  Poor venous access  Edema of both upper arms  Hypoalbuminemia  DNR (do not resuscitate)  Lactic acidosis  CAD (coronary artery disease)  Rheumatoid arthritis ([place])  88 year old famale with multiple comorbidities and acute presentations as outlined above. She is actively dying, nearing end of life. Remains on comfort care. Prognosis grim. After discussion have opted to remain in place for end of life.  SUMMARY OF RECOMMENDATIONS   Continue DNR Continue comfort care PMT will continue to follow for symptom checks  Symptom Management:  Robinul 0.2 mg IV q 4 hours prn secretions Haldol 2 mg IV q 4 hours agitation Dialudid 0.5 mg q 2 hrs prn pain/dyspnea ADDED dilaudid q 4 hours scheduled for pain/dyspnea Atican 1 mg IV q 4 hours prn anxiety Zofrn 4 mg IV q 6 hours prn nausea  Code Status: DNR  Prognosis: Hours - Days  Discharge Planning: Anticipated Hospital Death  Discussed with: Patient's family, medical team, nursing team, hospice laiaison  Thank you for allowing [place] to participate in the care of MARQUIE EXCELL PMT will continue to support holistically.  Time Total: 90 min  Visit consisted of counseling and education dealing with the complex and emotionally intense issues of symptom management and palliative care in the setting of serious and potentially life-threatening illness. Greater than 50%  of this time was spent counseling and coordinating care related to the above assessment and plan.  Walden Field, NP Palliative Medicine Team  Team Phone # [phone] (Nights/Weekends)  07/11/2021, 8:17 AM

## 2021-12-13 NOTE — Death Summary Note (Signed)
DEATH SUMMARY   Patient Details  Name: [name] MRN: [id number] DOB: [birth year] [id number], Evie Lacks, MD  Admission/Discharge Information   Admit Date:  2021/12/28  Date of Death: Date of Death: January 02, 2022  Time of Death: Time of Death: 1253-03-12  Length of Stay: 5   Principle Cause of death: sepsis due to [facility] Diagnoses: Principal Problem:   Sepsis due to Klebsiella [facility]) Active Problems:   COVID-19 virus infection   Aspiration pneumonia ([place])   Acute UTI (urinary tract infection)   Lactic acidosis   Acute renal failure superimposed on stage 2 chronic kidney disease (HCC)   Acute metabolic encephalopathy   Hypernatremia   Thrombocytopenia (HCC)   Adult failure to thrive   Poor venous access   Edema of both upper arms   Hypoalbuminemia   Type 2 diabetes mellitus with diabetic nephropathy, without long-term current use of insulin (HCC)   Hypoglycemia   Essential hypertension   Chronic diastolic CHF (congestive heart failure) (HCC)   Pressure injury of coccygeal region, stage 2 (HCC)   Seizures ([place])   Hyperlipidemia   Depression   DNR (do not resuscitate)   Goals of care, counseling/discussion   CAD (coronary artery disease)   Rheumatoid arthritis [facility])   Hospital Course: No notes on file  Assessment and Plan: * Sepsis due to Klebsiella [facility])- (present on admission) Aspiration pneumonia on right, lactic acidosis, Klebsiella UTI also on Humira every 2 weeks thus immunosuppressed. Presents with tachycardia and tachypnea meeting SIRS criteria. Urine culture positive for Klebsiella. Blood culture 1 out of 4 positive for coagulase-negative staph-likely contaminant. With ongoing encephalopathy, acute kidney injury as well as acute respiratory failure, Prognosis guarded.   Lactic acid level was getting worse despite therapy. Patient third spacing, IV fluid that she was receiving resulting in significant bilateral upper extremity edema. Family  currently wants to continue IV antibiotics but would like to transition to comfort care. Was treated wth IV ceftriaxone for total 5-day and azithromycin for 3 days.  COVID-19 virus infection- (present on admission) CT value 28.5.  Does not appear to have any acute COVID infection.  Did not receive any treatment per family. Per SNF, Tested positive initially on 11/07/2021. Isolation was removed. Not a candidate for acute therapy for now. I suspect [id number] playing a significant role in patient's decline over last 1 month.  Hypernatremia- (present on admission) From poor p.o. intake. Had a similar presentation earlier during the September admission. Prognosis guarded.  Pt was felt at end-of-life situation and actively dying.  Acute metabolic encephalopathy- (present on admission) Likely combination of respiratory failure, pneumonia, dehydration, hypernatremia, acute kidney injury with uremia, history of seizures. Patient most likely has underlying dementia based on the CT scan showing atrophy. CT head yesterday unremarkable. Patient was able to respond to verbal stimuli earlier in the morning but as the day progressed become minimally responsive. CBG remained stable. Renders poor prognosis. Not a good candidate for feeding tube placement.  Acute renal failure superimposed on stage 2 chronic kidney disease ([place])- (present on admission) Uremia. Likely prerenal etiology. Baseline serum creatinine around 0.6. On presentation serum creatinine 1.5, now worsening to 1.72. Along with a BUN significantly elevated 72. Patient has poor p.o. intake ongoing for last few weeks which is likely responsible for her presentation. GFR at baseline around 60.  Thrombocytopenia ([place])- (present on admission) Most likely this is in the setting of sepsis. No evidence of hemolysis so far. But renders poor prognosis.  Poor venous  access- (present on admission) Bilateral lower extremity  edema. Hypoalbuminemia. Third spacing. Patient is significant swelling of bilateral upper extremity. Per daughter her IV infiltrated yesterday, I am unable to verify this information. Currently patient has a midline and a peripheral IV. Unable to draw labs secondary to poor venous access. Not a good candidate for central line placement. PICC line attempt was not successful as guidewire was not able to pass beyond shoulder. With hypoalbuminemia and third spacing IV fluid that the patient is receiving is most likely extravasating. Recommended to discontinue IV fluid.  Adult failure to thrive- (present on admission) Patient has significant decline over the last few months. Due to multiple comorbidity patient is confined to bed. Has pressure ulcer, rheumatoid arthritis.  Multiple admission in 2022.  Has poor p.o. intake for last few weeks with declining mentation and generalized weakness. Most likely her condition worsened after her recent COVID infection. This renders poor prognosis for now.    Type 2 diabetes mellitus with diabetic nephropathy, without long-term current use of insulin ([place])- (present on admission) With hypoglycemia. Blood sugar low likely from poor p.o. intake ongoing for last few weeks. Hemoglobin A1c 6.2 11 months ago. Was comfort care.  Chronic diastolic CHF (congestive heart failure) ([place])- (present on admission) Significant volume overload in the setting of third spacing. Not a good candidate for aggressive diuresis in the setting of worsening renal function secondary to poor p.o. intake.  Essential hypertension- (present on admission) Blood pressure stable. Was comfort care.  Seizures ([place]) On Depakote. Unwitnessed seizure cannot rule out in the setting of encephalopathy. At present no evidence of seizure. home dose 625 mg 3 times daily.  Was on 500 mg 3 times daily IV.  Hyperlipidemia- (present on admission) On statin.  Medication on  hold.  Depression- (present on admission) On Cymbalta. Renders poor prognosis. Monitor.  Goals of care, counseling/discussion Extensive discussion with the daughter on the phone multiple times during the day on 1/26. Patient's condition critical/terminal.  Remaining minimally responsive only to painful stimuli. Along with that her lactic acid level worsening, sodium level worsening as well as serum creatinine worsening. This is despite aggressive therapy and treatment receiving in the ER. Patient has poor p.o. intake and not a good candidate for feeding tube given her aspiration pneumonia presentation. Her recent COVID-19 infection renders poor prognosis as well as most likely responsible for her progressive decline. Unable to follow any commands and not safe to swallow. IV fluids third spacing with severe bilateral upper extremity edema. Limited IV access unable to monitor progression with frequent lab draws. Not a good candidate for feeding tube placement as it will not reduce risk for aspiration.  And with her mentation might actually increase the risk for the aspiration. MOST form dictates DNR status. At present I suspect that the patient is at end-of-life with progressive decline over last few weeks with poor p.o. intake most likely resulting in failure to thrive and malnutrition along with acute kidney injury and hypernatremia.  Now culminating with UTI and aspiration pneumonia with sepsis and acute metabolic encephalopathy. Based on the discussion above family agreed with the transition to complete comfort. They wanted continue IV Antibiotics and oxygen, which were followed. Anticipation was that the patient most likely will pass away in the hospital. Pt had significant decline in her condition on 1/30. Seen with respiratory distress. Scheduled dilaudid ordered.Family meeting done to discuss options including residential hospice, but pt was felt not stable for transport.  Pt passed  away  on 1/30.   Rheumatoid arthritis ([place])- (present on admission) Immunosuppressed secondary to the treatment. Placing the patient at high risk for poor outcome.         Procedures: none  Consultations: Palliative care   The results of significant diagnostics from this hospitalization (including imaging, microbiology, ancillary and laboratory) are listed below for reference.   Significant Diagnostic Studies: CT HEAD WO CONTRAST  Result Date: 11/16/2021 CLINICAL DATA:  Mental status changes of unknown cause in a female at age 88. EXAM: CT HEAD WITHOUT CONTRAST TECHNIQUE: Contiguous axial images were obtained from the base of the skull through the vertex without intravenous contrast. RADIATION DOSE REDUCTION: This exam was performed according to the departmental dose-optimization program which includes automated exposure control, adjustment of the mA and/or kV according to patient size and/or use of iterative reconstruction technique. COMPARISON:  July 13, 2021. FINDINGS: Brain: No evidence of acute infarction, hemorrhage, hydrocephalus, extra-axial collection or mass lesion/mass effect. Signs of atrophy and chronic microvascular ischemic change as before. Vascular: No hyperdense vessel or unexpected calcification. Skull: Normal. Negative for fracture or focal lesion. Sinuses/Orbits: Visualized paranasal sinuses and orbits are unremarkable. Other: None IMPRESSION: 1. No acute intracranial process. 2. Signs of atrophy and chronic microvascular ischemic change as before. Electronically Signed   By: Zetta Bills M.D.   On: 11/24/2021 09:05   DG Chest Port 1 View  Result Date: 12/09/2021 CLINICAL DATA:  88 year old female with history of altered mental status. Choking. EXAM: PORTABLE CHEST 1 VIEW COMPARISON:  Chest x-ray 07/13/2021. FINDINGS: Extensive airspace consolidation in the right lower lobe. Additional opacity at the left lung base medially. Small left pleural effusion. No  pneumothorax. No evidence of pulmonary edema. Heart size is normal. Upper mediastinal contours are within normal limits. IMPRESSION: 1. The appearance of the chest is concerning for probable aspiration pneumonia given the patient's history of choking, as above. 2. Aortic atherosclerosis. Electronically Signed   By: Vinnie Langton M.D.   On: 11/21/2021 07:59   [place] EKG SITE RITE  Result Date: 12/12/2021 If Site Rite image not attached, placement could not be confirmed due to current cardiac rhythm.   Microbiology: Recent Results (from the past 240 hour(s))  Resp Panel by RT-PCR (Flu A&B, Covid) Nasopharyngeal Swab     Status: Abnormal   Collection Time: 12/10/2021  7:43 AM   Specimen: Nasopharyngeal Swab; Nasopharyngeal(NP) swabs in vial transport medium  Result Value Ref Range Status   SARS Coronavirus 2 by RT PCR POSITIVE (A) NEGATIVE Final    Comment: (NOTE) SARS-CoV-2 target nucleic acids are DETECTED.  The SARS-CoV-2 RNA is generally detectable in upper respiratory specimens during the acute phase of infection. Positive results are indicative of the presence of the identified virus, but do not rule out bacterial infection or co-infection with other pathogens not detected by the test. Clinical correlation with patient history and other diagnostic information is necessary to determine patient infection status. The expected result is Negative.  Fact Sheet for Patients: EntrepreneurPulse.com.au  Fact Sheet for Healthcare Providers: IncredibleEmployment.be  This test is not yet approved or cleared by the [place] FDA and  has been authorized for detection and/or diagnosis of SARS-CoV-2 by FDA under an Emergency Use Authorization (EUA).  This EUA will remain in effect (meaning this test can be used) for the duration of  the COVID-19 declaration under Section 564(b)(1) of the A ct, 21 U.S.C. section 360bbb-3(b)(1), unless the authorization  is terminated or revoked sooner.     Influenza A  by PCR NEGATIVE NEGATIVE Final   Influenza B by PCR NEGATIVE NEGATIVE Final    Comment: (NOTE) The Xpert Xpress SARS-CoV-2/FLU/RSV plus assay is intended as an aid in the diagnosis of influenza from Nasopharyngeal swab specimens and should not be used as a sole basis for treatment. Nasal washings and aspirates are unacceptable for Xpert Xpress SARS-CoV-2/FLU/RSV testing.  Fact Sheet for Patients: EntrepreneurPulse.com.au  Fact Sheet for Healthcare Providers: IncredibleEmployment.be  This test is not yet approved or cleared by the [place] FDA and has been authorized for detection and/or diagnosis of SARS-CoV-2 by FDA under an Emergency Use Authorization (EUA). This EUA will remain in effect (meaning this test can be used) for the duration of the COVID-19 declaration under Section 564(b)(1) of the Act, 21 U.S.C. section 360bbb-3(b)(1), unless the authorization is terminated or revoked.  Performed at [facility], [place] [address] [place], [place] [address]   Blood Cultures (routine x 2)     Status: Abnormal   Collection Time: 11/26/2021  7:59 AM   Specimen: BLOOD  Result Value Ref Range Status   Specimen Description   Final    BLOOD LEFT ANTECUBITAL Performed at [facility] [address] [place], [place] [address]    Special Requests   Final    BOTTLES DRAWN AEROBIC ONLY Blood Culture results may not be optimal due to an inadequate volume of blood received in culture bottles Performed at [facility] [address] [place], [place] [address]    Culture  Setup Time   Final    GRAM POSITIVE COCCI IN CLUSTERS AEROBIC BOTTLE ONLY CRITICAL RESULT CALLED TO, READ BACK BY AND VERIFIED WITH: PHARMD ELLEN JACKSON 12/08/21@1 :25 BY TW    Culture (A)  Final    STAPHYLOCOCCUS WARNERI THE SIGNIFICANCE OF ISOLATING THIS ORGANISM FROM A  SINGLE SET OF BLOOD CULTURES WHEN MULTIPLE SETS ARE DRAWN IS UNCERTAIN. PLEASE NOTIFY THE MICROBIOLOGY DEPARTMENT WITHIN ONE WEEK IF SPECIATION AND SENSITIVITIES ARE REQUIRED. Performed at [facility], [place] [address] [place], [place] [address]    Report Status 12/08/2021 FINAL  Final  Blood Cultures (routine x 2)     Status: None   Collection Time: 12/05/2021  7:59 AM   Specimen: BLOOD  Result Value Ref Range Status   Specimen Description   Final    BLOOD RIGHT ANTECUBITAL Performed at [facility] [address], [place], [place] [address]    Special Requests   Final    BOTTLES DRAWN AEROBIC ONLY Blood Culture adequate volume Performed at [facility] [address], [place], [place] [address]    Culture   Final    NO GROWTH 5 DAYS Performed at [facility], [place] [address] [place], [place] [address]    Report Status Jan 09, 2022 FINAL  Final  Blood Culture ID Panel (Reflexed)     Status: Abnormal   Collection Time: 11/13/2021  7:59 AM  Result Value Ref Range Status   Enterococcus faecalis NOT DETECTED NOT DETECTED Final   Enterococcus Faecium NOT DETECTED NOT DETECTED Final   Listeria monocytogenes NOT DETECTED NOT DETECTED Final   Staphylococcus species DETECTED (A) NOT DETECTED Final    Comment: CRITICAL RESULT CALLED TO, READ BACK BY AND VERIFIED WITH: PHARMD ELLEN JACKSON 12/08/21@1 :25 BY TW    Staphylococcus aureus (BCID) NOT DETECTED NOT DETECTED Final   Staphylococcus epidermidis NOT DETECTED NOT DETECTED Final   Staphylococcus lugdunensis NOT DETECTED NOT DETECTED Final  Streptococcus species NOT DETECTED NOT DETECTED Final   Streptococcus agalactiae NOT DETECTED NOT DETECTED Final   Streptococcus pneumoniae NOT DETECTED NOT DETECTED Final   Streptococcus pyogenes NOT DETECTED NOT DETECTED Final   A.calcoaceticus-baumannii NOT DETECTED NOT DETECTED Final   Bacteroides fragilis NOT DETECTED NOT DETECTED Final    Enterobacterales NOT DETECTED NOT DETECTED Final   Enterobacter cloacae complex NOT DETECTED NOT DETECTED Final   Escherichia coli NOT DETECTED NOT DETECTED Final   Klebsiella aerogenes NOT DETECTED NOT DETECTED Final   Klebsiella oxytoca NOT DETECTED NOT DETECTED Final   Klebsiella pneumoniae NOT DETECTED NOT DETECTED Final   Proteus species NOT DETECTED NOT DETECTED Final   Salmonella species NOT DETECTED NOT DETECTED Final   Serratia marcescens NOT DETECTED NOT DETECTED Final   Haemophilus influenzae NOT DETECTED NOT DETECTED Final   Neisseria meningitidis NOT DETECTED NOT DETECTED Final   Pseudomonas aeruginosa NOT DETECTED NOT DETECTED Final   Stenotrophomonas maltophilia NOT DETECTED NOT DETECTED Final   Candida albicans NOT DETECTED NOT DETECTED Final   Candida auris NOT DETECTED NOT DETECTED Final   Candida glabrata NOT DETECTED NOT DETECTED Final   Candida krusei NOT DETECTED NOT DETECTED Final   Candida parapsilosis NOT DETECTED NOT DETECTED Final   Candida tropicalis NOT DETECTED NOT DETECTED Final   Cryptococcus neoformans/gattii NOT DETECTED NOT DETECTED Final    Comment: Performed at [facility], [address] [place], [place] [address]  Urine Culture     Status: Abnormal   Collection Time: 12/05/2021 11:39 AM   Specimen: Urine, Clean Catch  Result Value Ref Range Status   Specimen Description   Final    URINE, CLEAN CATCH Performed at [facility], [place] [address], [place], [place] [address]    Special Requests   Final    Immunocompromised Performed at [facility], [place] [address], [place],  [address]    Culture >=100,000 COLONIES/mL KLEBSIELLA PNEUMONIAE (A)  Final   Report Status 12/08/2021 FINAL  Final   Organism ID, Bacteria KLEBSIELLA PNEUMONIAE (A)  Final      Susceptibility   Klebsiella pneumoniae - MIC*    AMPICILLIN >=32 RESISTANT Resistant     CEFAZOLIN <=4 SENSITIVE Sensitive     CEFEPIME <=0.12  SENSITIVE Sensitive     CEFTRIAXONE <=0.25 SENSITIVE Sensitive     CIPROFLOXACIN <=0.25 SENSITIVE Sensitive     GENTAMICIN <=1 SENSITIVE Sensitive     IMIPENEM <=0.25 SENSITIVE Sensitive     NITROFURANTOIN 32 SENSITIVE Sensitive     TRIMETH/SULFA <=20 SENSITIVE Sensitive     AMPICILLIN/SULBACTAM 8 SENSITIVE Sensitive     PIP/TAZO <=4 SENSITIVE Sensitive     * >=100,000 COLONIES/mL KLEBSIELLA PNEUMONIAE    Time spent: 50 minutes  Signed: Berle Mull, MD December 19, 2021

## 2021-12-13 DEATH — deceased
